# Patient Record
Sex: Female | Born: 1945 | Race: White | Hispanic: No | Marital: Married | Smoking: Former smoker
Health system: Southern US, Community
[De-identification: ages and names within clinical notes are randomized; demographics above are authoritative.]

## PROBLEM LIST (undated history)

## (undated) DIAGNOSIS — L900 Lichen sclerosus et atrophicus: Secondary | ICD-10-CM

## (undated) DIAGNOSIS — G629 Polyneuropathy, unspecified: Secondary | ICD-10-CM

## (undated) DIAGNOSIS — R519 Headache, unspecified: Secondary | ICD-10-CM

## (undated) DIAGNOSIS — Z9289 Personal history of other medical treatment: Secondary | ICD-10-CM

## (undated) DIAGNOSIS — H35039 Hypertensive retinopathy, unspecified eye: Secondary | ICD-10-CM

## (undated) DIAGNOSIS — K219 Gastro-esophageal reflux disease without esophagitis: Secondary | ICD-10-CM

## (undated) DIAGNOSIS — E785 Hyperlipidemia, unspecified: Secondary | ICD-10-CM

## (undated) DIAGNOSIS — IMO0002 Reserved for concepts with insufficient information to code with codable children: Secondary | ICD-10-CM

## (undated) DIAGNOSIS — F419 Anxiety disorder, unspecified: Secondary | ICD-10-CM

## (undated) DIAGNOSIS — H269 Unspecified cataract: Secondary | ICD-10-CM

## (undated) DIAGNOSIS — M8580 Other specified disorders of bone density and structure, unspecified site: Secondary | ICD-10-CM

## (undated) DIAGNOSIS — Z8781 Personal history of (healed) traumatic fracture: Secondary | ICD-10-CM

## (undated) DIAGNOSIS — E11319 Type 2 diabetes mellitus with unspecified diabetic retinopathy without macular edema: Secondary | ICD-10-CM

## (undated) DIAGNOSIS — I10 Essential (primary) hypertension: Secondary | ICD-10-CM

## (undated) DIAGNOSIS — M1990 Unspecified osteoarthritis, unspecified site: Secondary | ICD-10-CM

## (undated) DIAGNOSIS — G4730 Sleep apnea, unspecified: Secondary | ICD-10-CM

## (undated) DIAGNOSIS — I739 Peripheral vascular disease, unspecified: Secondary | ICD-10-CM

## (undated) DIAGNOSIS — K5792 Diverticulitis of intestine, part unspecified, without perforation or abscess without bleeding: Secondary | ICD-10-CM

## (undated) DIAGNOSIS — E039 Hypothyroidism, unspecified: Secondary | ICD-10-CM

## (undated) DIAGNOSIS — M109 Gout, unspecified: Secondary | ICD-10-CM

## (undated) DIAGNOSIS — D649 Anemia, unspecified: Secondary | ICD-10-CM

## (undated) DIAGNOSIS — N289 Disorder of kidney and ureter, unspecified: Secondary | ICD-10-CM

## (undated) DIAGNOSIS — E1165 Type 2 diabetes mellitus with hyperglycemia: Secondary | ICD-10-CM

## (undated) DIAGNOSIS — S92309A Fracture of unspecified metatarsal bone(s), unspecified foot, initial encounter for closed fracture: Secondary | ICD-10-CM

## (undated) HISTORY — PX: APPENDECTOMY: SHX54

## (undated) HISTORY — DX: Hypothyroidism, unspecified: E03.9

## (undated) HISTORY — DX: Unspecified cataract: H26.9

## (undated) HISTORY — DX: Gout, unspecified: M10.9

## (undated) HISTORY — DX: Type 2 diabetes mellitus with unspecified diabetic retinopathy without macular edema: E11.319

## (undated) HISTORY — DX: Essential (primary) hypertension: I10

## (undated) HISTORY — DX: Personal history of (healed) traumatic fracture: Z87.81

## (undated) HISTORY — PX: DIAGNOSTIC LAPAROSCOPY: SUR761

## (undated) HISTORY — DX: Unspecified osteoarthritis, unspecified site: M19.90

## (undated) HISTORY — DX: Polyneuropathy, unspecified: G62.9

## (undated) HISTORY — DX: Hypertensive retinopathy, unspecified eye: H35.039

## (undated) HISTORY — DX: Personal history of other medical treatment: Z92.89

## (undated) HISTORY — DX: Gastro-esophageal reflux disease without esophagitis: K21.9

## (undated) HISTORY — DX: Fracture of unspecified metatarsal bone(s), unspecified foot, initial encounter for closed fracture: S92.309A

## (undated) HISTORY — DX: Hyperlipidemia, unspecified: E78.5

## (undated) HISTORY — DX: Type 2 diabetes mellitus with hyperglycemia: E11.65

## (undated) HISTORY — PX: EYE SURGERY: SHX253

## (undated) HISTORY — DX: Lichen sclerosus et atrophicus: L90.0

## (undated) HISTORY — DX: Reserved for concepts with insufficient information to code with codable children: IMO0002

## (undated) HISTORY — PX: CATARACT EXTRACTION: SUR2

## (undated) HISTORY — PX: ABDOMINAL HYSTERECTOMY: SHX81

---

## 1987-01-04 HISTORY — PX: DILATION AND CURETTAGE OF UTERUS: SHX78

## 1995-01-04 HISTORY — PX: REDUCTION MAMMAPLASTY: SUR839

## 1998-01-03 HISTORY — PX: LAPAROSCOPIC HYSTERECTOMY: SHX1926

## 1999-01-04 HISTORY — PX: BREAST REDUCTION SURGERY: SHX8

## 2010-01-03 HISTORY — PX: OTHER SURGICAL HISTORY: SHX169

## 2012-08-16 ENCOUNTER — Ambulatory Visit: Payer: Self-pay | Admitting: Internal Medicine

## 2013-03-05 ENCOUNTER — Ambulatory Visit: Payer: Self-pay | Admitting: Gastroenterology

## 2013-03-05 HISTORY — PX: COLONOSCOPY: SHX174

## 2013-03-05 HISTORY — PX: ESOPHAGOGASTRODUODENOSCOPY: SHX1529

## 2013-03-09 LAB — PATHOLOGY REPORT

## 2013-09-13 ENCOUNTER — Ambulatory Visit: Payer: Self-pay | Admitting: Internal Medicine

## 2013-10-08 DIAGNOSIS — Z9289 Personal history of other medical treatment: Secondary | ICD-10-CM | POA: Insufficient documentation

## 2013-12-30 DIAGNOSIS — L900 Lichen sclerosus et atrophicus: Secondary | ICD-10-CM | POA: Insufficient documentation

## 2013-12-30 HISTORY — DX: Lichen sclerosus et atrophicus: L90.0

## 2014-05-13 DIAGNOSIS — IMO0002 Reserved for concepts with insufficient information to code with codable children: Secondary | ICD-10-CM | POA: Insufficient documentation

## 2014-05-13 DIAGNOSIS — E1165 Type 2 diabetes mellitus with hyperglycemia: Secondary | ICD-10-CM | POA: Insufficient documentation

## 2014-08-11 ENCOUNTER — Other Ambulatory Visit: Payer: Self-pay | Admitting: Internal Medicine

## 2014-08-11 DIAGNOSIS — Z1231 Encounter for screening mammogram for malignant neoplasm of breast: Secondary | ICD-10-CM

## 2014-09-15 ENCOUNTER — Ambulatory Visit
Admission: RE | Admit: 2014-09-15 | Discharge: 2014-09-15 | Disposition: A | Discharge status: Home / Self Care | Payer: Medicare Other | Source: Ambulatory Visit | Attending: Internal Medicine | Admitting: Internal Medicine

## 2014-09-15 ENCOUNTER — Other Ambulatory Visit: Payer: Self-pay | Admitting: Internal Medicine

## 2014-09-15 DIAGNOSIS — Z1231 Encounter for screening mammogram for malignant neoplasm of breast: Secondary | ICD-10-CM | POA: Insufficient documentation

## 2014-09-18 ENCOUNTER — Encounter: Payer: Self-pay | Admitting: *Deleted

## 2014-09-22 ENCOUNTER — Inpatient Hospital Stay: Payer: Medicare Other | Attending: Internal Medicine | Admitting: Internal Medicine

## 2014-09-22 ENCOUNTER — Encounter: Payer: Self-pay | Admitting: *Deleted

## 2014-09-22 ENCOUNTER — Inpatient Hospital Stay: Payer: Medicare Other

## 2014-09-22 ENCOUNTER — Telehealth: Payer: Self-pay | Admitting: *Deleted

## 2014-09-22 ENCOUNTER — Encounter (INDEPENDENT_AMBULATORY_CARE_PROVIDER_SITE_OTHER): Payer: Self-pay

## 2014-09-22 ENCOUNTER — Ambulatory Visit: Payer: Self-pay

## 2014-09-22 VITALS — BP 126/72 | HR 71 | Temp 98.0°F | Resp 18 | Ht 65.0 in | Wt 169.8 lb

## 2014-09-22 DIAGNOSIS — E039 Hypothyroidism, unspecified: Secondary | ICD-10-CM | POA: Diagnosis not present

## 2014-09-22 DIAGNOSIS — E119 Type 2 diabetes mellitus without complications: Secondary | ICD-10-CM | POA: Insufficient documentation

## 2014-09-22 DIAGNOSIS — Z87891 Personal history of nicotine dependence: Secondary | ICD-10-CM | POA: Insufficient documentation

## 2014-09-22 DIAGNOSIS — I10 Essential (primary) hypertension: Secondary | ICD-10-CM | POA: Insufficient documentation

## 2014-09-22 DIAGNOSIS — Z7982 Long term (current) use of aspirin: Secondary | ICD-10-CM | POA: Insufficient documentation

## 2014-09-22 DIAGNOSIS — D509 Iron deficiency anemia, unspecified: Secondary | ICD-10-CM

## 2014-09-22 DIAGNOSIS — Z79899 Other long term (current) drug therapy: Secondary | ICD-10-CM

## 2014-09-22 DIAGNOSIS — E785 Hyperlipidemia, unspecified: Secondary | ICD-10-CM | POA: Insufficient documentation

## 2014-09-22 DIAGNOSIS — M109 Gout, unspecified: Secondary | ICD-10-CM | POA: Insufficient documentation

## 2014-09-22 DIAGNOSIS — E079 Disorder of thyroid, unspecified: Secondary | ICD-10-CM | POA: Insufficient documentation

## 2014-09-22 LAB — RETICULOCYTES
RBC.: 4.68 MIL/uL (ref 3.80–5.20)
RETIC COUNT ABSOLUTE: 112.3 10*3/uL (ref 19.0–183.0)
Retic Ct Pct: 2.4 % (ref 0.4–3.1)

## 2014-09-22 LAB — CBC WITH DIFFERENTIAL/PLATELET
BASOS ABS: 0 10*3/uL (ref 0–0.1)
Basophils Relative: 1 %
EOS PCT: 2 %
Eosinophils Absolute: 0.2 10*3/uL (ref 0–0.7)
HCT: 38.5 % (ref 35.0–47.0)
Hemoglobin: 12.6 g/dL (ref 12.0–16.0)
LYMPHS PCT: 26 %
Lymphs Abs: 1.7 10*3/uL (ref 1.0–3.6)
MCH: 26.9 pg (ref 26.0–34.0)
MCHC: 32.7 g/dL (ref 32.0–36.0)
MCV: 82.3 fL (ref 80.0–100.0)
MONO ABS: 0.7 10*3/uL (ref 0.2–0.9)
Monocytes Relative: 11 %
Neutro Abs: 3.9 10*3/uL (ref 1.4–6.5)
Neutrophils Relative %: 60 %
PLATELETS: 350 10*3/uL (ref 150–440)
RBC: 4.68 MIL/uL (ref 3.80–5.20)
RDW: 20.3 % — AB (ref 11.5–14.5)
WBC: 6.5 10*3/uL (ref 3.6–11.0)

## 2014-09-22 LAB — COMPREHENSIVE METABOLIC PANEL
ALT: 19 U/L (ref 14–54)
ANION GAP: 9 (ref 5–15)
AST: 27 U/L (ref 15–41)
Albumin: 4.2 g/dL (ref 3.5–5.0)
Alkaline Phosphatase: 104 U/L (ref 38–126)
BILIRUBIN TOTAL: 0.8 mg/dL (ref 0.3–1.2)
BUN: 23 mg/dL — AB (ref 6–20)
CO2: 23 mmol/L (ref 22–32)
Calcium: 9.3 mg/dL (ref 8.9–10.3)
Chloride: 105 mmol/L (ref 101–111)
Creatinine, Ser: 1.68 mg/dL — ABNORMAL HIGH (ref 0.44–1.00)
GFR calc Af Amer: 35 mL/min — ABNORMAL LOW (ref >60)
GFR, EST NON AFRICAN AMERICAN: 30 mL/min — AB (ref >60)
Glucose, Bld: 131 mg/dL — ABNORMAL HIGH (ref 65–99)
POTASSIUM: 4.5 mmol/L (ref 3.5–5.1)
Sodium: 137 mmol/L (ref 135–145)
TOTAL PROTEIN: 8.5 g/dL — AB (ref 6.5–8.1)

## 2014-09-22 LAB — LACTATE DEHYDROGENASE: LDH: 140 U/L (ref 98–192)

## 2014-09-22 LAB — FERRITIN: Ferritin: 12 ng/mL (ref 11–307)

## 2014-09-22 NOTE — Progress Notes (Signed)
[facility] CONSULT NOTE  Patient Care Team: Danella Penton, MD as PCP - General (Internal Medicine)  Dr. Shelle Iron [GI]  CHIEF COMPLAINTS/PURPOSE OF CONSULTATION:  Iron deficiency anemia  HISTORY OF PRESENTING ILLNESS:   Cheryl Leonard 69 y.o. female is here because of iron deficiency anemia.  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 She also had a colonoscopy in 2015-negative for polyps. She also had EGD in spring of 2015 for reflux disease that was again negative.  March 2016-UA negative for blood.   Patient denies any blood in stools or black stools. Denies any blood in urine. Denies any nausea or vomiting denies any unusual weight loss. Patient complains of excessive fatigue especially the last 3-4 months. She is very tired after minimal exertion. Patient has been on by mouth iron in spring of 2016- without much improvement in her fatigue levels. However most recent hemoglobin/September 2016 has been around 10.5. Ferritin 7.   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 No chest pain shortness of breath. A complete 10 point review of systems the system negative except mentioned above in history of present illness.    MEDICAL HISTORY:  Past Medical History  Diagnosis Date  . Type 2 diabetes mellitus, uncontrolled   . Lichen sclerosus 12/30/2013    of vulva  . Polyneuropathy     numbness and tingling in feet and toes  . History of positive PPD     Patient always shows positive  . Hyperlipidemia   . Hypothyroidism   . History of fracture of patella     right knee  . Arthritis   . Gout   . Cataracts, both eyes   . Hypertension     SURGICAL HISTORY: Past Surgical History  Procedure Laterality Date  . Reduction mammaplasty  1997  . Appendectomy    . Cataract extraction    . Laparoscopic hysterectomy   2000    total  . Eyelid surgery  2012  . Dilation and curettage of uterus  1989  . Breast reduction surgery  2001  . Cesarean section  1976  . Colonoscopy  03/05/2013    Nml - due for repeat 03/06/2018  . Esophagogastroduodenoscopy  03/05/2013    SOCIAL HISTORY: Social History   Social History  . Marital Status: Married    Spouse Name: N/A  . Number of Children: N/A  . Years of Education: N/A   Occupational History  . Not on file.   Social History Main Topics  . Smoking status: Former Smoker -- 1.00 packs/day for 20 years    Types: Cigarettes  . Smokeless tobacco: Never Used     Comment: started smoking at age 9  . Alcohol Use: No  . Drug Use: No  . Sexual Activity:    Partners: Male    Birth Control/ Protection: Surgical   Other Topics Concern  . Not on file   Social History Narrative    FAMILY HISTORY: Family History  Problem Relation Age of Onset  . Coronary artery disease Mother   . Coronary artery disease Father   . Heart attack Mother   . Heart attack Father   . Ovarian cancer Sister 43    sister had hormonal therapy for IVF txs-which increased risk factor for ovarian cancer    ALLERGIES:  has No Known  Allergies.  MEDICATIONS:  Current Outpatient Prescriptions  Medication Sig Dispense Refill  . ALPRAZolam (XANAX) 0.25 MG tablet Take 1 tablet by mouth as needed. For sleep    . aspirin EC 81 MG tablet Take 1 tablet by mouth daily.    [name] atorvastatin (LIPITOR) 20 MG tablet Take 20 mg by mouth at bedtime.  3  . B-D UF III MINI PEN NEEDLES 31G X 5 MM MISC daily.  3  . buPROPion (WELLBUTRIN XL) 300 MG 24 hr tablet Take 1 tablet by mouth daily.  11  . BYSTOLIC 5 MG tablet Take 5 mg by mouth daily.  2  . canagliflozin (INVOKANA) 100 MG TABS tablet Take 1 tablet by mouth daily.    . CVS ALCOHOL SWABS PADS daily. as directed  3  . DULoxetine (CYMBALTA) 60 MG capsule Take 1 capsule by mouth 2 (two) times daily.    [name] gemfibrozil (LOPID) 600 MG tablet Take 600 mg  by mouth 2 (two) times daily.  3  . glimepiride (AMARYL) 4 MG tablet Take 1 tablet by mouth daily.  3  . Iron-Vitamin C (VITRON-C) 65-125 MG TABS Take 1 tablet by mouth daily.    [name] JANUMET 50-1000 MG per tablet Take 1 tablet by mouth 2 (two) times daily with a meal.  3  . levothyroxine (SYNTHROID, LEVOTHROID) 100 MCG tablet Take 100 mcg by mouth daily.  3  . lisinopril (PRINIVIL,ZESTRIL) 10 MG tablet Take 1 tablet by mouth daily.    . ranitidine (ZANTAC) 300 MG capsule Take 300 mg by mouth at bedtime.  1  . TOUJEO SOLOSTAR 300 UNIT/ML SOPN Inject 30 Units into the skin at bedtime.  7  . zolpidem (AMBIEN) 10 MG tablet Take 10 mg by mouth at bedtime.  5   No current facility-administered medications for this visit.     PHYSICAL EXAMINATION: ECOG PERFORMANCE STATUS: 1 - Symptomatic but completely ambulatory  Filed Vitals:   09/22/14 1111  BP: 126/72  Pulse: 71  Temp: 98 F (36.7 C)  Resp: 18   Filed Weights   09/22/14 1111  Weight: 169 lb 12.1 oz (77.001 kg)   Patient is alone. She is able to sit on the exam table without any difficulty. GENERAL:alert, no distress and comfortable SKIN: skin color, texture, turgor are normal, no rashes or significant lesions EYES: normal, conjunctiva are pink and non-injected, sclera clear OROPHARYNX:no exudate, no erythema and lips, buccal mucosa, and tongue normal  NECK: supple, thyroid normal size, non-tender, without nodularity LYMPH:  no palpable lymphadenopathy in the cervical, axillary or inguinal LUNGS: clear to auscultation; no wheeze or auscultation HEART: regular rate & rhythm and no murmurs and no lower extremity edema ABDOMEN:abdomen soft, non-tender and normal bowel sounds Musculoskeletal:no cyanosis of digits and no clubbing  PSYCH: alert & oriented x 3 with fluent speech NEURO: no focal motor/sensory deficits  LABORATORY DATA:  I have reviewed the data as listed    ASSESSMENT & PLAN:  #1 iron deficiency anemia-unclear  etiology question chronic GI blood loss versus malabsorption versus hemolysis. Negative EGD and colonoscopy [spring 2015] and also recent capsule study [spring 2016]. Most recent hemoglobin September 2016 is 10.5 ferritin 7.   Patient has been on by mouth iron for the last few months without any significant improvement. Even though hemoglobin is not currently very  low; patient has symptoms of extreme fatigue which could be attributed to low iron stores/ferritin levels.   I recommend checking today CBC, LDH; CMP and also reticulocyte  count. If the today's blood work does not reveal any etiology CT scans will be ordered for further evaluation of the kidneys etc. If the hemoglobin continues running low/getting worse- would recommend IV iron infusion. This was discussed with patient and she agrees.  I will recommend follow-up in approximately 3 months or earlier based upon the results of the above workup.  Thank you Dr. Shelle Iron for allowing me to participate in the care of your pleasant patient. Please do not hesitate to contact me if any questions or concerns in the interim.  All questions were answered. The patient knows to call the clinic with any problems, questions or concerns. I spent 30 minutes counseling the patient face to face. The total time spent in the appointment was 60 minutes and more than 50% was on counseling.     Earna Coder, MD 09/22/2014 11:24 AM

## 2014-09-22 NOTE — Telephone Encounter (Signed)
Per Dr. Donneta Romberg, RN notified patient patient that her creatinine is slightly up from 1.2 to currently 1.68. Explained to patient that MD has routed this information to her PCP. Regarding her hemoglobin-currently 12.5; much improved from a baseline. Patient educated to continue iron pills for now. Follow-up as planned in 3 months. Patient expressed gratitude for the call regarding her lab results.

## 2014-09-22 NOTE — Progress Notes (Signed)
The patient has been taking Vitron C since January 2016. She has been tolerating the oral iron without any complications or side effects.

## 2014-12-25 ENCOUNTER — Other Ambulatory Visit: Payer: Self-pay | Admitting: *Deleted

## 2014-12-25 ENCOUNTER — Inpatient Hospital Stay: Payer: Medicare Other | Attending: Family Medicine

## 2014-12-25 ENCOUNTER — Inpatient Hospital Stay: Payer: Medicare Other | Admitting: Family Medicine

## 2014-12-25 ENCOUNTER — Inpatient Hospital Stay (HOSPITAL_BASED_OUTPATIENT_CLINIC_OR_DEPARTMENT_OTHER): Payer: Medicare Other | Admitting: Family Medicine

## 2014-12-25 ENCOUNTER — Inpatient Hospital Stay: Payer: Medicare Other

## 2014-12-25 VITALS — BP 125/72 | HR 76 | Temp 98.8°F | Resp 18 | Ht 65.0 in | Wt 170.9 lb

## 2014-12-25 DIAGNOSIS — Z7982 Long term (current) use of aspirin: Secondary | ICD-10-CM | POA: Diagnosis not present

## 2014-12-25 DIAGNOSIS — D509 Iron deficiency anemia, unspecified: Secondary | ICD-10-CM

## 2014-12-25 DIAGNOSIS — E1165 Type 2 diabetes mellitus with hyperglycemia: Secondary | ICD-10-CM | POA: Insufficient documentation

## 2014-12-25 DIAGNOSIS — Z79899 Other long term (current) drug therapy: Secondary | ICD-10-CM | POA: Diagnosis not present

## 2014-12-25 DIAGNOSIS — K219 Gastro-esophageal reflux disease without esophagitis: Secondary | ICD-10-CM | POA: Insufficient documentation

## 2014-12-25 DIAGNOSIS — E039 Hypothyroidism, unspecified: Secondary | ICD-10-CM | POA: Diagnosis not present

## 2014-12-25 DIAGNOSIS — Z87891 Personal history of nicotine dependence: Secondary | ICD-10-CM | POA: Diagnosis not present

## 2014-12-25 DIAGNOSIS — E785 Hyperlipidemia, unspecified: Secondary | ICD-10-CM | POA: Insufficient documentation

## 2014-12-25 DIAGNOSIS — M109 Gout, unspecified: Secondary | ICD-10-CM | POA: Insufficient documentation

## 2014-12-25 DIAGNOSIS — I10 Essential (primary) hypertension: Secondary | ICD-10-CM | POA: Diagnosis not present

## 2014-12-25 LAB — IRON AND TIBC
Iron: 60 ug/dL (ref 28–170)
SATURATION RATIOS: 13 % (ref 10.4–31.8)
TIBC: 457 ug/dL — ABNORMAL HIGH (ref 250–450)
UIBC: 397 ug/dL

## 2014-12-25 LAB — CBC WITH DIFFERENTIAL/PLATELET
BASOS ABS: 0 10*3/uL (ref 0–0.1)
BASOS PCT: 1 %
EOS ABS: 0.1 10*3/uL (ref 0–0.7)
Eosinophils Relative: 2 %
HEMATOCRIT: 39.2 % (ref 35.0–47.0)
Hemoglobin: 13.3 g/dL (ref 12.0–16.0)
Lymphocytes Relative: 22 %
Lymphs Abs: 1.7 10*3/uL (ref 1.0–3.6)
MCH: 30.3 pg (ref 26.0–34.0)
MCHC: 33.8 g/dL (ref 32.0–36.0)
MCV: 89.6 fL (ref 80.0–100.0)
MONO ABS: 0.9 10*3/uL (ref 0.2–0.9)
Monocytes Relative: 12 %
NEUTROS ABS: 4.8 10*3/uL (ref 1.4–6.5)
Neutrophils Relative %: 63 %
PLATELETS: 408 10*3/uL (ref 150–440)
RBC: 4.38 MIL/uL (ref 3.80–5.20)
RDW: 15.5 % — AB (ref 11.5–14.5)
WBC: 7.6 10*3/uL (ref 3.6–11.0)

## 2014-12-25 LAB — FERRITIN: Ferritin: 15 ng/mL (ref 11–307)

## 2014-12-25 NOTE — Progress Notes (Signed)
[facility]  [phone]  [phone]     Cheryl Leonard DOB: 1945-02-09  MR#: [id number]  QIO#:[id number]  Patient Care Team: Danella Penton, MD as PCP - General (Internal Medicine) Elnita Maxwell, MD as Referring Physician (Gastroenterology) Earna Coder, MD as Consulting Physician (Hematology and Oncology)  CHIEF COMPLAINT: IDA  INTERVAL HISTORY:  Patient is here for further evaluation and treatment consideration regarding Iron deficiency Anemia. 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 She also had a colonoscopy in 2015-negative for polyps. She also had EGD in spring of 2015 for reflux disease that was again negative. March 2016-UA negative for blood. Patient denies any blood in stools or black stools. Denies any blood in urine. Denies any nausea or vomiting or any unusual weight loss. She reports improvement in fatigue over the last several months. She just returned from a 10 day cruise. She is tolerating her oral iron supplement well, only having constipation at times.   REVIEW OF SYSTEMS:   Review of Systems  Constitutional: Negative for fever, chills, weight loss, malaise/fatigue and diaphoresis.  HENT: Negative for congestion, nosebleeds and sore throat.   Eyes: Negative for blurred vision, double vision, photophobia, pain, discharge and redness.  Respiratory: Negative for cough, hemoptysis, sputum production, shortness of breath, wheezing and stridor.   Cardiovascular: Negative for chest pain, palpitations, orthopnea, claudication, leg swelling and PND.  Gastrointestinal: Negative for heartburn, nausea, vomiting, abdominal pain, diarrhea, constipation, blood in stool and melena.  Genitourinary: Negative.   Musculoskeletal: Negative.   Skin: Negative.   Neurological: Negative for dizziness, focal  weakness, seizures and weakness.       Neuropathy related to diabetes  Endo/Heme/Allergies: Does not bruise/bleed easily.  Psychiatric/Behavioral: Negative for depression. The patient is not nervous/anxious and does not have insomnia.     As per HPI. Otherwise, a complete review of systems is negatve.   PAST MEDICAL HISTORY: Past Medical History  Diagnosis Date  . Type 2 diabetes mellitus, uncontrolled   . Lichen sclerosus 12/30/2013    of vulva  . Polyneuropathy     numbness and tingling in feet and toes  . History of positive PPD     Patient always shows positive  . Hyperlipidemia   . Hypothyroidism   . History of fracture of patella     right knee  . Arthritis   . Gout   . Cataracts, both eyes   . Hypertension   . GERD (gastroesophageal reflux disease)     PAST SURGICAL HISTORY: Past Surgical History  Procedure Laterality Date  . Reduction mammaplasty  1997  . Appendectomy    . Cataract extraction    . Laparoscopic hysterectomy  2000    total  . Eyelid surgery  2012  . Dilation and curettage of uterus  1989  . Breast reduction surgery  2001  . Cesarean section  1976  . Colonoscopy  03/05/2013    Nml - due for repeat 03/06/2018  . Esophagogastroduodenoscopy  03/05/2013    FAMILY HISTORY Family History  Problem Relation Age of Onset  . Coronary artery disease Mother   . Coronary artery disease Father   . Heart attack Mother   . Heart attack Father   . Ovarian cancer Sister 59    sister had hormonal therapy for IVF txs-which increased risk factor for ovarian cancer  GYNECOLOGIC HISTORY:  No LMP recorded. Patient has had a hysterectomy.     ADVANCED DIRECTIVES:    HEALTH MAINTENANCE: Social History  Substance Use Topics  . Smoking status: Former Smoker -- 1.00 packs/day for 20 years    Types: Cigarettes  . Smokeless tobacco: Never Used     Comment: started smoking at age 69  . Alcohol Use: No     Colonoscopy:  PAP:  Bone density:  Lipid  panel:  Allergies no known allergies  Current Outpatient Prescriptions  Medication Sig Dispense Refill  . ALPRAZolam (XANAX) 0.25 MG tablet Take 1 tablet by mouth as needed. For sleep    . aspirin EC 81 MG tablet Take 1 tablet by mouth daily.    [name]. atorvastatin (LIPITOR) 20 MG tablet Take 20 mg by mouth at bedtime.  3  . B-D UF III MINI PEN NEEDLES 31G X 5 MM MISC daily.  3  . buPROPion (WELLBUTRIN XL) 300 MG 24 hr tablet Take 1 tablet by mouth daily.  11  . BYSTOLIC 5 MG tablet Take 5 mg by mouth daily.  2  . canagliflozin (INVOKANA) 100 MG TABS tablet Take 1 tablet by mouth daily.    . CVS ALCOHOL SWABS PADS daily. as directed  3  . DULoxetine (CYMBALTA) 60 MG capsule Take 1 capsule by mouth 2 (two) times daily.    [name]. gemfibrozil (LOPID) 600 MG tablet Take 600 mg by mouth 2 (two) times daily.  3  . glimepiride (AMARYL) 4 MG tablet Take 1 tablet by mouth daily.  3  . Iron-Vitamin C (VITRON-C) 65-125 MG TABS Take 1 tablet by mouth 2 (two) times daily.     [name]. JANUMET 50-1000 MG per tablet Take 1 tablet by mouth 2 (two) times daily with a meal.  3  . levothyroxine (SYNTHROID, LEVOTHROID) 100 MCG tablet Take 100 mcg by mouth daily.  3  . lisinopril (PRINIVIL,ZESTRIL) 10 MG tablet Take 1 tablet by mouth daily.    . ranitidine (ZANTAC) 300 MG capsule Take 300 mg by mouth at bedtime.  1  . TOUJEO SOLOSTAR 300 UNIT/ML SOPN Inject 30 Units into the skin at bedtime.  7  . zolpidem (AMBIEN) 10 MG tablet Take 10 mg by mouth at bedtime.  5   No current facility-administered medications for this visit.    OBJECTIVE: There were no vitals taken for this visit.   There is no weight on file to calculate BMI.    ECOG FS:0 - Asymptomatic  General: Well-developed, well-nourished, no acute distress. Eyes: Pink conjunctiva, anicteric sclera. HEENT: Normocephalic, moist mucous membranes, clear oropharnyx. Lungs: Clear to auscultation bilaterally. Heart: Regular rate and rhythm. No rubs, murmurs, or  gallops. Abdomen: Soft, nontender, nondistended. No organomegaly noted, normoactive bowel sounds. Musculoskeletal: No edema, cyanosis, or clubbing. Neuro: Alert, answering all questions appropriately. Cranial nerves grossly intact.  Skin: No rashes or petechiae noted. Psych: Normal affect.    LAB RESULTS:  Appointment on 12/25/2014  Component Date Value Ref Range Status  . WBC 12/25/2014 7.6  3.6 - 11.0 K/uL Final  . RBC 12/25/2014 4.38  3.80 - 5.20 MIL/uL Final  . Hemoglobin 12/25/2014 13.3  12.0 - 16.0 g/dL Final  . HCT 13/24/401012/22/2016 39.2  35.0 - 47.0 % Final  . MCV 12/25/2014 89.6  80.0 - 100.0 fL Final  . MCH 12/25/2014 30.3  26.0 - 34.0 pg Final  . MCHC 12/25/2014 33.8  32.0 - 36.0 g/dL Final  . RDW 27/25/366412/22/2016 15.5* 11.5 -  14.5 % Final  . Platelets 12/25/2014 408  150 - 440 K/uL Final  . Neutrophils Relative % 12/25/2014 63   Final  . Neutro Abs 12/25/2014 4.8  1.4 - 6.5 K/uL Final  . Lymphocytes Relative 12/25/2014 22   Final  . Lymphs Abs 12/25/2014 1.7  1.0 - 3.6 K/uL Final  . Monocytes Relative 12/25/2014 12   Final  . Monocytes Absolute 12/25/2014 0.9  0.2 - 0.9 K/uL Final  . Eosinophils Relative 12/25/2014 2   Final  . Eosinophils Absolute 12/25/2014 0.1  0 - 0.7 K/uL Final  . Basophils Relative 12/25/2014 1   Final  . Basophils Absolute 12/25/2014 0.0  0 - 0.1 K/uL Final    STUDIES: No results found.  ASSESSMENT:  IDA.  PLAN:   1. IDA. Etiology remains unclear, chronic gi blood loss versus malabsorption versus hemolysis. She has had a negative colonoscopy, EGD, as well as capsule study. Tolerating Iron and vitamin C tablet very well. Hgb currently 13.3.  Advised patient to continue with current regimen of oral iron. Ferritin and Iron/ TIBC are still pending. Will continue with next follow up in 3 months.   Patient expressed understanding and was in agreement with this plan. She also understands that She can call clinic at any time with any questions, concerns, or  complaints.   Dr. Doylene Canning was available for consultation and review of plan of care for this patient.   Loann Quill, NP   12/25/2014 2:34 PM

## 2015-03-26 ENCOUNTER — Inpatient Hospital Stay: Payer: Medicare Other | Attending: Internal Medicine

## 2015-03-26 ENCOUNTER — Inpatient Hospital Stay (HOSPITAL_BASED_OUTPATIENT_CLINIC_OR_DEPARTMENT_OTHER): Payer: Medicare Other | Admitting: Internal Medicine

## 2015-03-26 ENCOUNTER — Encounter: Payer: Self-pay | Admitting: Internal Medicine

## 2015-03-26 VITALS — BP 133/74 | HR 73 | Temp 97.2°F | Resp 18 | Ht 65.0 in | Wt 164.2 lb

## 2015-03-26 DIAGNOSIS — M109 Gout, unspecified: Secondary | ICD-10-CM | POA: Diagnosis not present

## 2015-03-26 DIAGNOSIS — D509 Iron deficiency anemia, unspecified: Secondary | ICD-10-CM | POA: Diagnosis not present

## 2015-03-26 DIAGNOSIS — Z90710 Acquired absence of both cervix and uterus: Secondary | ICD-10-CM | POA: Insufficient documentation

## 2015-03-26 DIAGNOSIS — Z79899 Other long term (current) drug therapy: Secondary | ICD-10-CM

## 2015-03-26 DIAGNOSIS — E119 Type 2 diabetes mellitus without complications: Secondary | ICD-10-CM | POA: Insufficient documentation

## 2015-03-26 DIAGNOSIS — K219 Gastro-esophageal reflux disease without esophagitis: Secondary | ICD-10-CM | POA: Insufficient documentation

## 2015-03-26 DIAGNOSIS — I10 Essential (primary) hypertension: Secondary | ICD-10-CM | POA: Diagnosis not present

## 2015-03-26 DIAGNOSIS — E785 Hyperlipidemia, unspecified: Secondary | ICD-10-CM | POA: Insufficient documentation

## 2015-03-26 DIAGNOSIS — E039 Hypothyroidism, unspecified: Secondary | ICD-10-CM | POA: Insufficient documentation

## 2015-03-26 DIAGNOSIS — Z87891 Personal history of nicotine dependence: Secondary | ICD-10-CM | POA: Insufficient documentation

## 2015-03-26 DIAGNOSIS — Z7982 Long term (current) use of aspirin: Secondary | ICD-10-CM | POA: Insufficient documentation

## 2015-03-26 DIAGNOSIS — M1990 Unspecified osteoarthritis, unspecified site: Secondary | ICD-10-CM | POA: Insufficient documentation

## 2015-03-26 LAB — IRON AND TIBC
IRON: 73 ug/dL (ref 28–170)
Saturation Ratios: 18 % (ref 10.4–31.8)
TIBC: 414 ug/dL (ref 250–450)
UIBC: 341 ug/dL

## 2015-03-26 LAB — CBC WITH DIFFERENTIAL/PLATELET
BASOS PCT: 1 %
Basophils Absolute: 0.1 10*3/uL (ref 0–0.1)
Eosinophils Absolute: 0.1 10*3/uL (ref 0–0.7)
Eosinophils Relative: 2 %
HEMATOCRIT: 42.7 % (ref 35.0–47.0)
HEMOGLOBIN: 14.6 g/dL (ref 12.0–16.0)
LYMPHS ABS: 1.3 10*3/uL (ref 1.0–3.6)
LYMPHS PCT: 22 %
MCH: 29.9 pg (ref 26.0–34.0)
MCHC: 34.3 g/dL (ref 32.0–36.0)
MCV: 87.2 fL (ref 80.0–100.0)
MONO ABS: 0.7 10*3/uL (ref 0.2–0.9)
MONOS PCT: 12 %
NEUTROS ABS: 3.9 10*3/uL (ref 1.4–6.5)
NEUTROS PCT: 63 %
Platelets: 344 10*3/uL (ref 150–440)
RBC: 4.9 MIL/uL (ref 3.80–5.20)
RDW: 15.1 % — AB (ref 11.5–14.5)
WBC: 6.1 10*3/uL (ref 3.6–11.0)

## 2015-03-26 LAB — FERRITIN: Ferritin: 17 ng/mL (ref 11–307)

## 2015-03-26 NOTE — Progress Notes (Signed)
Pt more fatigued because of her metarsal fracture, not moving around with boot in place on left foot. She does not see any blood in urine or stool.

## 2015-03-26 NOTE — Progress Notes (Signed)
[facility] CONSULT NOTE  Patient Care Team: Danella Penton, MD as PCP - General (Internal Medicine) Elnita Maxwell, MD as Referring Physician (Gastroenterology) Earna Coder, MD as Consulting Physician (Hematology and Oncology)  Dr. Shelle Iron [GI]  CHIEF COMPLAINTS/PURPOSE OF CONSULTATION:   # SEP 2016- Iron deficiency anemia [September 2016 is 10.5 ferritin 7; Negative EGD/ colonoscopy [spring 2015]; capsule study [spring 2016] on PO iron BID.   HISTORY OF PRESENTING ILLNESS:   Cheryl Leonard 70 y.o. female  patient with above history of iron deficiency anemia- question etiology is here for follow-up.  Patient is currently on by mouth iron twice a day. No constipation or diarrhea. No abdominal discomfort. Denies any unusual fatigue.  Patient had a recent mechanical fall for which she hurt her left foot; currently in a brace. Denies any blood in stools black red stools.  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 No chest pain shortness of breath. A complete 10 point review of systems the system negative except mentioned above in history of present illness.    MEDICAL HISTORY:  Past Medical History  Diagnosis Date  . Type 2 diabetes mellitus, uncontrolled (HCC)   . Lichen sclerosus 12/30/2013    of vulva  . Polyneuropathy (HCC)     numbness and tingling in feet and toes  . History of positive PPD     Patient always shows positive  . Hyperlipidemia   . Hypothyroidism   . History of fracture of patella     right knee  . Arthritis   . Gout   . Cataracts, both eyes   . Hypertension   . GERD (gastroesophageal reflux disease)   . Metatarsal fracture     SURGICAL HISTORY: Past Surgical History  Procedure Laterality Date  . Reduction mammaplasty  1997  . Appendectomy    . Cataract extraction    . Laparoscopic hysterectomy  2000    total  . Eyelid surgery  2012  . Dilation and curettage of uterus  1989  . Breast  reduction surgery  2001  . Cesarean section  1976  . Colonoscopy  03/05/2013    Nml - due for repeat 03/06/2018  . Esophagogastroduodenoscopy  03/05/2013    SOCIAL HISTORY: Social History   Social History  . Marital Status: Married    Spouse Name: N/A  . Number of Children: N/A  . Years of Education: N/A   Occupational History  . Not on file.   Social History Main Topics  . Smoking status: Former Smoker -- 1.00 packs/day for 20 years    Types: Cigarettes  . Smokeless tobacco: Never Used     Comment: started smoking at age 60  . Alcohol Use: No  . Drug Use: No  . Sexual Activity:    Partners: Male    Birth Control/ Protection: Surgical   Other Topics Concern  . Not on file   Social History Narrative    FAMILY HISTORY: Family History  Problem Relation Age of Onset  . Coronary artery disease Mother   . Coronary artery disease Father   . Heart attack Mother   . Heart attack Father   . Ovarian cancer Sister 60    sister had hormonal therapy for IVF txs-which increased risk factor for ovarian cancer    ALLERGIES:  has No Known Allergies.  MEDICATIONS:  Current Outpatient Prescriptions  Medication Sig Dispense Refill  . ALPRAZolam (XANAX) 0.25 MG tablet Take 1 tablet by  mouth as needed. For sleep    . aspirin EC 81 MG tablet Take 1 tablet by mouth daily.    [name]. atorvastatin (LIPITOR) 20 MG tablet Take 20 mg by mouth at bedtime.  3  . B-D UF III MINI PEN NEEDLES 31G X 5 MM MISC daily.  3  . buPROPion (WELLBUTRIN XL) 300 MG 24 hr tablet Take 1 tablet by mouth daily.  11  . BYSTOLIC 5 MG tablet Take 5 mg by mouth daily.  2  . CVS ALCOHOL SWABS PADS daily. as directed  3  . DULoxetine (CYMBALTA) 60 MG capsule Take 1 capsule by mouth 2 (two) times daily.    [name]. gemfibrozil (LOPID) 600 MG tablet Take 600 mg by mouth 2 (two) times daily.  3  . glimepiride (AMARYL) 4 MG tablet Take 1 tablet by mouth daily.  3  . Iron-Vitamin C (VITRON-C) 65-125 MG TABS Take 1 tablet by mouth 2  (two) times daily.     [name]. JANUMET 50-1000 MG per tablet Take 1 tablet by mouth 2 (two) times daily with a meal.  3  . levothyroxine (SYNTHROID, LEVOTHROID) 100 MCG tablet Take 100 mcg by mouth daily.  3  . lisinopril (PRINIVIL,ZESTRIL) 10 MG tablet Take 1 tablet by mouth daily.    . pantoprazole (PROTONIX) 40 MG tablet Take 40 mg by mouth 2 (two) times daily.    . ranitidine (ZANTAC) 300 MG capsule Take 300 mg by mouth at bedtime.  1  . TOUJEO SOLOSTAR 300 UNIT/ML SOPN Inject 30 Units into the skin at bedtime.  7  . zolpidem (AMBIEN) 10 MG tablet Take 10 mg by mouth at bedtime.  5   No current facility-administered medications for this visit.     PHYSICAL EXAMINATION: ECOG PERFORMANCE STATUS: 1 - Symptomatic but completely ambulatory  Filed Vitals:   03/26/15 1055  BP: 133/74  Pulse: 73  Temp: 97.2 F (36.2 C)  Resp: 18   Filed Weights   03/26/15 1055  Weight: 164 lb 3.9 oz (74.5 kg)   Patient is alone. GENERAL:alert, no distress and comfortable SKIN: skin color, texture, turgor are normal, no rashes or significant lesions EYES: normal, conjunctiva are pink and non-injected, sclera clear OROPHARYNX:no exudate, no erythema and lips, buccal mucosa, and tongue normal  NECK: supple, thyroid normal size, non-tender, without nodularity LYMPH:  no palpable lymphadenopathy in the cervical, axillary or inguinal LUNGS: clear to auscultation; no wheeze or auscultation HEART: regular rate & rhythm and no murmurs and no lower extremity edema; LEFT foot in brace.  ABDOMEN:abdomen soft, non-tender and normal bowel sounds Musculoskeletal:no cyanosis of digits and no clubbing  PSYCH: alert & oriented x 3 with fluent speech NEURO: no focal motor/sensory deficits  ASSESSMENT & PLAN:  #1 iron deficiency anemia-unclear etiology. GI workup negative. On by mouth iron twice a day.  Today hemoglobin is 14; ferritin 17. Saturation 18%. Continue by mouth iron at this time. She does not need any IV  iron at this time.   # Patient follow-up with me in 6 months with CBC BMP iron studies ferritin; possible Feraheme if needed [unlikely to need IV iron].      Earna CoderGovinda R Brahmanday, MD 03/26/2015 11:04 AM

## 2015-04-16 DIAGNOSIS — S32000A Wedge compression fracture of unspecified lumbar vertebra, initial encounter for closed fracture: Secondary | ICD-10-CM | POA: Insufficient documentation

## 2015-04-16 DIAGNOSIS — I10 Essential (primary) hypertension: Secondary | ICD-10-CM | POA: Insufficient documentation

## 2015-04-16 DIAGNOSIS — E782 Mixed hyperlipidemia: Secondary | ICD-10-CM | POA: Insufficient documentation

## 2015-09-17 ENCOUNTER — Other Ambulatory Visit: Payer: Self-pay | Admitting: Internal Medicine

## 2015-09-17 DIAGNOSIS — Z1231 Encounter for screening mammogram for malignant neoplasm of breast: Secondary | ICD-10-CM

## 2015-09-22 ENCOUNTER — Other Ambulatory Visit: Payer: Self-pay

## 2015-09-22 ENCOUNTER — Inpatient Hospital Stay: Payer: Medicare Other | Attending: Internal Medicine

## 2015-09-22 DIAGNOSIS — M109 Gout, unspecified: Secondary | ICD-10-CM | POA: Insufficient documentation

## 2015-09-22 DIAGNOSIS — M1990 Unspecified osteoarthritis, unspecified site: Secondary | ICD-10-CM | POA: Insufficient documentation

## 2015-09-22 DIAGNOSIS — D509 Iron deficiency anemia, unspecified: Secondary | ICD-10-CM

## 2015-09-22 DIAGNOSIS — Z79899 Other long term (current) drug therapy: Secondary | ICD-10-CM | POA: Insufficient documentation

## 2015-09-22 DIAGNOSIS — Z87891 Personal history of nicotine dependence: Secondary | ICD-10-CM | POA: Diagnosis not present

## 2015-09-22 DIAGNOSIS — E785 Hyperlipidemia, unspecified: Secondary | ICD-10-CM | POA: Insufficient documentation

## 2015-09-22 DIAGNOSIS — Z7982 Long term (current) use of aspirin: Secondary | ICD-10-CM | POA: Diagnosis not present

## 2015-09-22 DIAGNOSIS — K219 Gastro-esophageal reflux disease without esophagitis: Secondary | ICD-10-CM | POA: Diagnosis not present

## 2015-09-22 DIAGNOSIS — E119 Type 2 diabetes mellitus without complications: Secondary | ICD-10-CM | POA: Diagnosis not present

## 2015-09-22 DIAGNOSIS — I10 Essential (primary) hypertension: Secondary | ICD-10-CM | POA: Diagnosis not present

## 2015-09-22 DIAGNOSIS — E039 Hypothyroidism, unspecified: Secondary | ICD-10-CM | POA: Insufficient documentation

## 2015-09-22 DIAGNOSIS — G629 Polyneuropathy, unspecified: Secondary | ICD-10-CM | POA: Diagnosis not present

## 2015-09-22 LAB — CBC WITH DIFFERENTIAL/PLATELET
BASOS ABS: 0 10*3/uL (ref 0–0.1)
BASOS PCT: 1 %
Eosinophils Absolute: 0.2 10*3/uL (ref 0–0.7)
Eosinophils Relative: 2 %
HEMATOCRIT: 35.4 % (ref 35.0–47.0)
HEMOGLOBIN: 12.5 g/dL (ref 12.0–16.0)
Lymphocytes Relative: 24 %
Lymphs Abs: 1.7 10*3/uL (ref 1.0–3.6)
MCH: 31.3 pg (ref 26.0–34.0)
MCHC: 35.4 g/dL (ref 32.0–36.0)
MCV: 88.4 fL (ref 80.0–100.0)
MONO ABS: 0.7 10*3/uL (ref 0.2–0.9)
Monocytes Relative: 10 %
NEUTROS ABS: 4.3 10*3/uL (ref 1.4–6.5)
NEUTROS PCT: 63 %
Platelets: 397 10*3/uL (ref 150–440)
RBC: 4 MIL/uL (ref 3.80–5.20)
RDW: 13.4 % (ref 11.5–14.5)
WBC: 7 10*3/uL (ref 3.6–11.0)

## 2015-09-22 LAB — IRON AND TIBC
Iron: 72 ug/dL (ref 28–170)
SATURATION RATIOS: 19 % (ref 10.4–31.8)
TIBC: 388 ug/dL (ref 250–450)
UIBC: 316 ug/dL

## 2015-09-22 LAB — COMPREHENSIVE METABOLIC PANEL
ALBUMIN: 4.3 g/dL (ref 3.5–5.0)
ALT: 34 U/L (ref 14–54)
AST: 40 U/L (ref 15–41)
Alkaline Phosphatase: 80 U/L (ref 38–126)
Anion gap: 9 (ref 5–15)
BILIRUBIN TOTAL: 0.9 mg/dL (ref 0.3–1.2)
BUN: 24 mg/dL — AB (ref 6–20)
CO2: 24 mmol/L (ref 22–32)
Calcium: 9.6 mg/dL (ref 8.9–10.3)
Chloride: 103 mmol/L (ref 101–111)
Creatinine, Ser: 1.29 mg/dL — ABNORMAL HIGH (ref 0.44–1.00)
GFR calc Af Amer: 47 mL/min — ABNORMAL LOW (ref >60)
GFR calc non Af Amer: 41 mL/min — ABNORMAL LOW (ref >60)
GLUCOSE: 144 mg/dL — AB (ref 65–99)
POTASSIUM: 5 mmol/L (ref 3.5–5.1)
Sodium: 136 mmol/L (ref 135–145)
TOTAL PROTEIN: 7.9 g/dL (ref 6.5–8.1)

## 2015-09-22 LAB — FERRITIN: Ferritin: 35 ng/mL (ref 11–307)

## 2015-09-24 ENCOUNTER — Inpatient Hospital Stay: Payer: Medicare Other

## 2015-09-24 ENCOUNTER — Inpatient Hospital Stay (HOSPITAL_BASED_OUTPATIENT_CLINIC_OR_DEPARTMENT_OTHER): Payer: Medicare Other | Admitting: Internal Medicine

## 2015-09-24 ENCOUNTER — Encounter: Payer: Self-pay | Admitting: Internal Medicine

## 2015-09-24 VITALS — BP 99/66 | HR 78 | Temp 95.4°F | Resp 17 | Ht 65.0 in | Wt 165.4 lb

## 2015-09-24 DIAGNOSIS — Z79899 Other long term (current) drug therapy: Secondary | ICD-10-CM | POA: Diagnosis not present

## 2015-09-24 DIAGNOSIS — D509 Iron deficiency anemia, unspecified: Secondary | ICD-10-CM | POA: Diagnosis not present

## 2015-09-24 NOTE — Progress Notes (Signed)
[facility] CONSULT NOTE  Patient Care Team: Danella Penton, MD as PCP - General (Internal Medicine) Elnita Maxwell, MD as Referring Physician (Gastroenterology) Earna Coder, MD as Consulting Physician (Hematology and Oncology)  Dr. Shelle Iron [GI]  CHIEF COMPLAINTS/PURPOSE OF CONSULTATION:   # SEP 2016- Iron deficiency anemia [September 2016 is 10.5 ferritin 7; Negative EGD/ colonoscopy [spring 2015]; capsule study [spring 2016] on PO iron BID.   # CKD [creat 1.3-1.6]  HISTORY OF PRESENTING ILLNESS:   Cheryl Leonard 70 y.o. female  patient with above history of iron deficiency anemia- question etiology is here for follow-up.  Patient continues to be on oral iron.  No constipation or diarrhea. No abdominal discomfort. Denies any unusual fatigue.  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 No chest pain shortness of breath. A complete 10 point review of systems the system negative except mentioned above in history of present illness.    MEDICAL HISTORY:  Past Medical History:  Diagnosis Date  . Arthritis   . Cataracts, both eyes   . GERD (gastroesophageal reflux disease)   . Gout   . History of fracture of patella    right knee  . History of positive PPD    Patient always shows positive  . Hyperlipidemia   . Hypertension   . Hypothyroidism   . Lichen sclerosus 12/30/2013   of vulva  . Metatarsal fracture   . Polyneuropathy (HCC)    numbness and tingling in feet and toes  . Type 2 diabetes mellitus, uncontrolled (HCC)     SURGICAL HISTORY: Past Surgical History:  Procedure Laterality Date  . APPENDECTOMY    . BREAST REDUCTION SURGERY  2001  . CATARACT EXTRACTION    . CESAREAN SECTION  1976  . COLONOSCOPY  03/05/2013   Nml - due for repeat 03/06/2018  . DILATION AND CURETTAGE OF UTERUS  1989  . ESOPHAGOGASTRODUODENOSCOPY  03/05/2013  . Eyelid Surgery  2012  . LAPAROSCOPIC HYSTERECTOMY  2000   total  . REDUCTION  MAMMAPLASTY  1997    SOCIAL HISTORY: Social History   Social History  . Marital status: Married    Spouse name: N/A  . Number of children: N/A  . Years of education: N/A   Occupational History  . Not on file.   Social History Main Topics  . Smoking status: Former Smoker    Packs/day: 1.00    Years: 20.00    Types: Cigarettes  . Smokeless tobacco: Never Used     Comment: started smoking at age 57  . Alcohol use No  . Drug use: No  . Sexual activity: Yes    Partners: Male    Birth control/ protection: Surgical   Other Topics Concern  . Not on file   Social History Narrative  . No narrative on file    FAMILY HISTORY: Family History  Problem Relation Age of Onset  . Coronary artery disease Father   . Heart attack Father   . Coronary artery disease Mother   . Heart attack Mother   . Ovarian cancer Sister 43    sister had hormonal therapy for IVF txs-which increased risk factor for ovarian cancer    ALLERGIES:  has No Known Allergies.  MEDICATIONS:  Current Outpatient Prescriptions  Medication Sig Dispense Refill  . ALPRAZolam (XANAX) 0.25 MG tablet Take 1 tablet by mouth as needed. For sleep    . aspirin EC 81 MG tablet Take 1 tablet by  mouth daily.    [name]. atorvastatin (LIPITOR) 20 MG tablet Take 20 mg by mouth at bedtime.  3  . B-D UF III MINI PEN NEEDLES 31G X 5 MM MISC daily.  3  . buPROPion (WELLBUTRIN XL) 300 MG 24 hr tablet Take 1 tablet by mouth daily.  11  . BYSTOLIC 5 MG tablet Take 5 mg by mouth daily.  2  . [employer] ALCOHOL SWABS PADS daily. as directed  3  . DULoxetine (CYMBALTA) 60 MG capsule Take 1 capsule by mouth 2 (two) times daily.    [name]. gemfibrozil (LOPID) 600 MG tablet Take 600 mg by mouth 2 (two) times daily.  3  . Iron-Vitamin C (VITRON-C) 65-125 MG TABS Take 1 tablet by mouth 2 (two) times daily.     [name]. JANUMET 50-1000 MG per tablet Take 1 tablet by mouth 2 (two) times daily with a meal.  3  . levothyroxine (SYNTHROID, LEVOTHROID) 100 MCG tablet  Take 100 mcg by mouth daily.  3  . lisinopril (PRINIVIL,ZESTRIL) 10 MG tablet Take 1 tablet by mouth daily.    . pantoprazole (PROTONIX) 40 MG tablet Take 40 mg by mouth 2 (two) times daily.    . ranitidine (ZANTAC) 300 MG capsule Take 300 mg by mouth at bedtime.  1  . TOUJEO SOLOSTAR 300 UNIT/ML SOPN Inject 30 Units into the skin at bedtime.  7  . zolpidem (AMBIEN) 10 MG tablet Take 10 mg by mouth at bedtime.  5   No current facility-administered medications for this visit.      PHYSICAL EXAMINATION: ECOG PERFORMANCE STATUS: 1 - Symptomatic but completely ambulatory  Vitals:   09/24/15 1335  BP: 99/66  Pulse: 78  Resp: 17  Temp: (!) 95.4 F (35.2 C)   Filed Weights   09/24/15 1335  Weight: 165 lb 6.4 oz (75 kg)   Patient is alone. GENERAL:alert, no distress and comfortable SKIN: skin color, texture, turgor are normal, no rashes or significant lesions EYES: normal, conjunctiva are pink and non-injected, sclera clear OROPHARYNX:no exudate, no erythema and lips, buccal mucosa, and tongue normal  NECK: supple, thyroid normal size, non-tender, without nodularity LYMPH:  no palpable lymphadenopathy in the cervical, axillary or inguinal LUNGS: clear to auscultation; no wheeze or auscultation HEART: regular rate & rhythm and no murmurs and no lower extremity edema ABDOMEN:abdomen soft, non-tender and normal bowel sounds Musculoskeletal:no cyanosis of digits and no clubbing  PSYCH: alert & oriented x 3 with fluent speech NEURO: no focal motor/sensory deficits  ASSESSMENT & PLAN:  Iron deficiency anemia #1 iron deficiency anemia-unclear etiology. GI workup negative. On by mouth iron twice a day.  Today hemoglobin is 12.5; Iron studies- not deficient.   # Patient follow-up with me in 6 months with CBC BMP iron studies ferritin- 1 week prior; if steady then discharge pt from clinic at that time. Pt agrees.        Earna CoderGovinda R Aleeza [place], MD 09/25/15 6:17 PM

## 2015-09-24 NOTE — Assessment & Plan Note (Signed)
#  1 iron deficiency anemia-unclear etiology. GI workup negative. On by mouth iron twice a day.  Today hemoglobin is 12.5; Iron studies- not deficient.   # Patient follow-up with me in 6 months with CBC BMP iron studies ferritin- 1 week prior; if steady then discharge pt from clinic at that time. Pt agrees.

## 2015-10-08 ENCOUNTER — Ambulatory Visit
Admission: RE | Admit: 2015-10-08 | Discharge: 2015-10-08 | Disposition: A | Discharge status: Home / Self Care | Payer: Medicare Other | Source: Ambulatory Visit | Attending: Internal Medicine | Admitting: Internal Medicine

## 2015-10-08 DIAGNOSIS — Z1231 Encounter for screening mammogram for malignant neoplasm of breast: Secondary | ICD-10-CM | POA: Insufficient documentation

## 2015-10-16 DIAGNOSIS — E039 Hypothyroidism, unspecified: Secondary | ICD-10-CM | POA: Insufficient documentation

## 2015-10-16 DIAGNOSIS — N1832 Chronic kidney disease, stage 3b: Secondary | ICD-10-CM | POA: Insufficient documentation

## 2015-10-29 ENCOUNTER — Observation Stay: Payer: Medicare Other

## 2015-10-29 ENCOUNTER — Emergency Department: Payer: Medicare Other

## 2015-10-29 ENCOUNTER — Encounter: Payer: Self-pay | Admitting: Emergency Medicine

## 2015-10-29 ENCOUNTER — Inpatient Hospital Stay
Admission: EM | Admit: 2015-10-29 | Discharge: 2015-11-01 | DRG: 480 | Disposition: A | Discharge status: Skilled Nursing Facility | Payer: Medicare Other | Attending: Internal Medicine | Admitting: Internal Medicine

## 2015-10-29 DIAGNOSIS — Z419 Encounter for procedure for purposes other than remedying health state, unspecified: Secondary | ICD-10-CM

## 2015-10-29 DIAGNOSIS — S72113A Displaced fracture of greater trochanter of unspecified femur, initial encounter for closed fracture: Secondary | ICD-10-CM | POA: Diagnosis present

## 2015-10-29 DIAGNOSIS — E039 Hypothyroidism, unspecified: Secondary | ICD-10-CM | POA: Diagnosis present

## 2015-10-29 DIAGNOSIS — Z794 Long term (current) use of insulin: Secondary | ICD-10-CM

## 2015-10-29 DIAGNOSIS — W19XXXA Unspecified fall, initial encounter: Secondary | ICD-10-CM | POA: Diagnosis present

## 2015-10-29 DIAGNOSIS — E119 Type 2 diabetes mellitus without complications: Secondary | ICD-10-CM | POA: Diagnosis present

## 2015-10-29 DIAGNOSIS — Z87891 Personal history of nicotine dependence: Secondary | ICD-10-CM

## 2015-10-29 DIAGNOSIS — K219 Gastro-esophageal reflux disease without esophagitis: Secondary | ICD-10-CM | POA: Diagnosis present

## 2015-10-29 DIAGNOSIS — Y92010 Kitchen of single-family (private) house as the place of occurrence of the external cause: Secondary | ICD-10-CM

## 2015-10-29 DIAGNOSIS — E785 Hyperlipidemia, unspecified: Secondary | ICD-10-CM | POA: Diagnosis present

## 2015-10-29 DIAGNOSIS — Z7982 Long term (current) use of aspirin: Secondary | ICD-10-CM

## 2015-10-29 DIAGNOSIS — S72002A Fracture of unspecified part of neck of left femur, initial encounter for closed fracture: Secondary | ICD-10-CM

## 2015-10-29 DIAGNOSIS — Z8041 Family history of malignant neoplasm of ovary: Secondary | ICD-10-CM

## 2015-10-29 DIAGNOSIS — Z79899 Other long term (current) drug therapy: Secondary | ICD-10-CM

## 2015-10-29 DIAGNOSIS — J9811 Atelectasis: Secondary | ICD-10-CM | POA: Diagnosis not present

## 2015-10-29 DIAGNOSIS — M25552 Pain in left hip: Secondary | ICD-10-CM | POA: Diagnosis not present

## 2015-10-29 DIAGNOSIS — W1830XA Fall on same level, unspecified, initial encounter: Secondary | ICD-10-CM | POA: Diagnosis present

## 2015-10-29 DIAGNOSIS — S72115A Nondisplaced fracture of greater trochanter of left femur, initial encounter for closed fracture: Secondary | ICD-10-CM | POA: Diagnosis not present

## 2015-10-29 DIAGNOSIS — Z8249 Family history of ischemic heart disease and other diseases of the circulatory system: Secondary | ICD-10-CM

## 2015-10-29 DIAGNOSIS — S72009A Fracture of unspecified part of neck of unspecified femur, initial encounter for closed fracture: Secondary | ICD-10-CM | POA: Diagnosis present

## 2015-10-29 DIAGNOSIS — R509 Fever, unspecified: Secondary | ICD-10-CM

## 2015-10-29 DIAGNOSIS — J189 Pneumonia, unspecified organism: Secondary | ICD-10-CM | POA: Diagnosis not present

## 2015-10-29 DIAGNOSIS — I10 Essential (primary) hypertension: Secondary | ICD-10-CM | POA: Diagnosis present

## 2015-10-29 LAB — BASIC METABOLIC PANEL
Anion gap: 9 (ref 5–15)
BUN: 25 mg/dL — AB (ref 6–20)
CHLORIDE: 104 mmol/L (ref 101–111)
CO2: 24 mmol/L (ref 22–32)
Calcium: 9.7 mg/dL (ref 8.9–10.3)
Creatinine, Ser: 1.29 mg/dL — ABNORMAL HIGH (ref 0.44–1.00)
GFR calc Af Amer: 47 mL/min — ABNORMAL LOW (ref >60)
GFR calc non Af Amer: 41 mL/min — ABNORMAL LOW (ref >60)
GLUCOSE: 122 mg/dL — AB (ref 65–99)
POTASSIUM: 4.6 mmol/L (ref 3.5–5.1)
Sodium: 137 mmol/L (ref 135–145)

## 2015-10-29 LAB — PROTIME-INR
INR: 0.98
PROTHROMBIN TIME: 13 s (ref 11.4–15.2)

## 2015-10-29 LAB — CBC WITH DIFFERENTIAL/PLATELET
Basophils Absolute: 0 10*3/uL (ref 0–0.1)
Basophils Relative: 0 %
EOS PCT: 1 %
Eosinophils Absolute: 0.2 10*3/uL (ref 0–0.7)
HCT: 35 % (ref 35.0–47.0)
HEMOGLOBIN: 12 g/dL (ref 12.0–16.0)
LYMPHS ABS: 1.4 10*3/uL (ref 1.0–3.6)
LYMPHS PCT: 11 %
MCH: 31.1 pg (ref 26.0–34.0)
MCHC: 34.3 g/dL (ref 32.0–36.0)
MCV: 90.8 fL (ref 80.0–100.0)
Monocytes Absolute: 0.8 10*3/uL (ref 0.2–0.9)
Monocytes Relative: 6 %
Neutro Abs: 10.4 10*3/uL — ABNORMAL HIGH (ref 1.4–6.5)
Neutrophils Relative %: 82 %
PLATELETS: 338 10*3/uL (ref 150–440)
RBC: 3.86 MIL/uL (ref 3.80–5.20)
RDW: 13.7 % (ref 11.5–14.5)
WBC: 12.9 10*3/uL — AB (ref 3.6–11.0)

## 2015-10-29 LAB — TYPE AND SCREEN
ABO/RH(D): B NEG
Antibody Screen: NEGATIVE

## 2015-10-29 LAB — GLUCOSE, CAPILLARY: Glucose-Capillary: 144 mg/dL — ABNORMAL HIGH (ref 65–99)

## 2015-10-29 LAB — APTT: APTT: 28 s (ref 24–36)

## 2015-10-29 IMAGING — CR DG HIP (WITH OR WITHOUT PELVIS) 2-3V*L*
3 series · 3 of 3 positions shown · non-contrast
Comparison: None.

CLINICAL DATA: Fall.  Left hip pain.  Initial encounter.

EXAM:
DG HIP (WITH OR WITHOUT PELVIS) 2-3V LEFT

[pelvis ap]
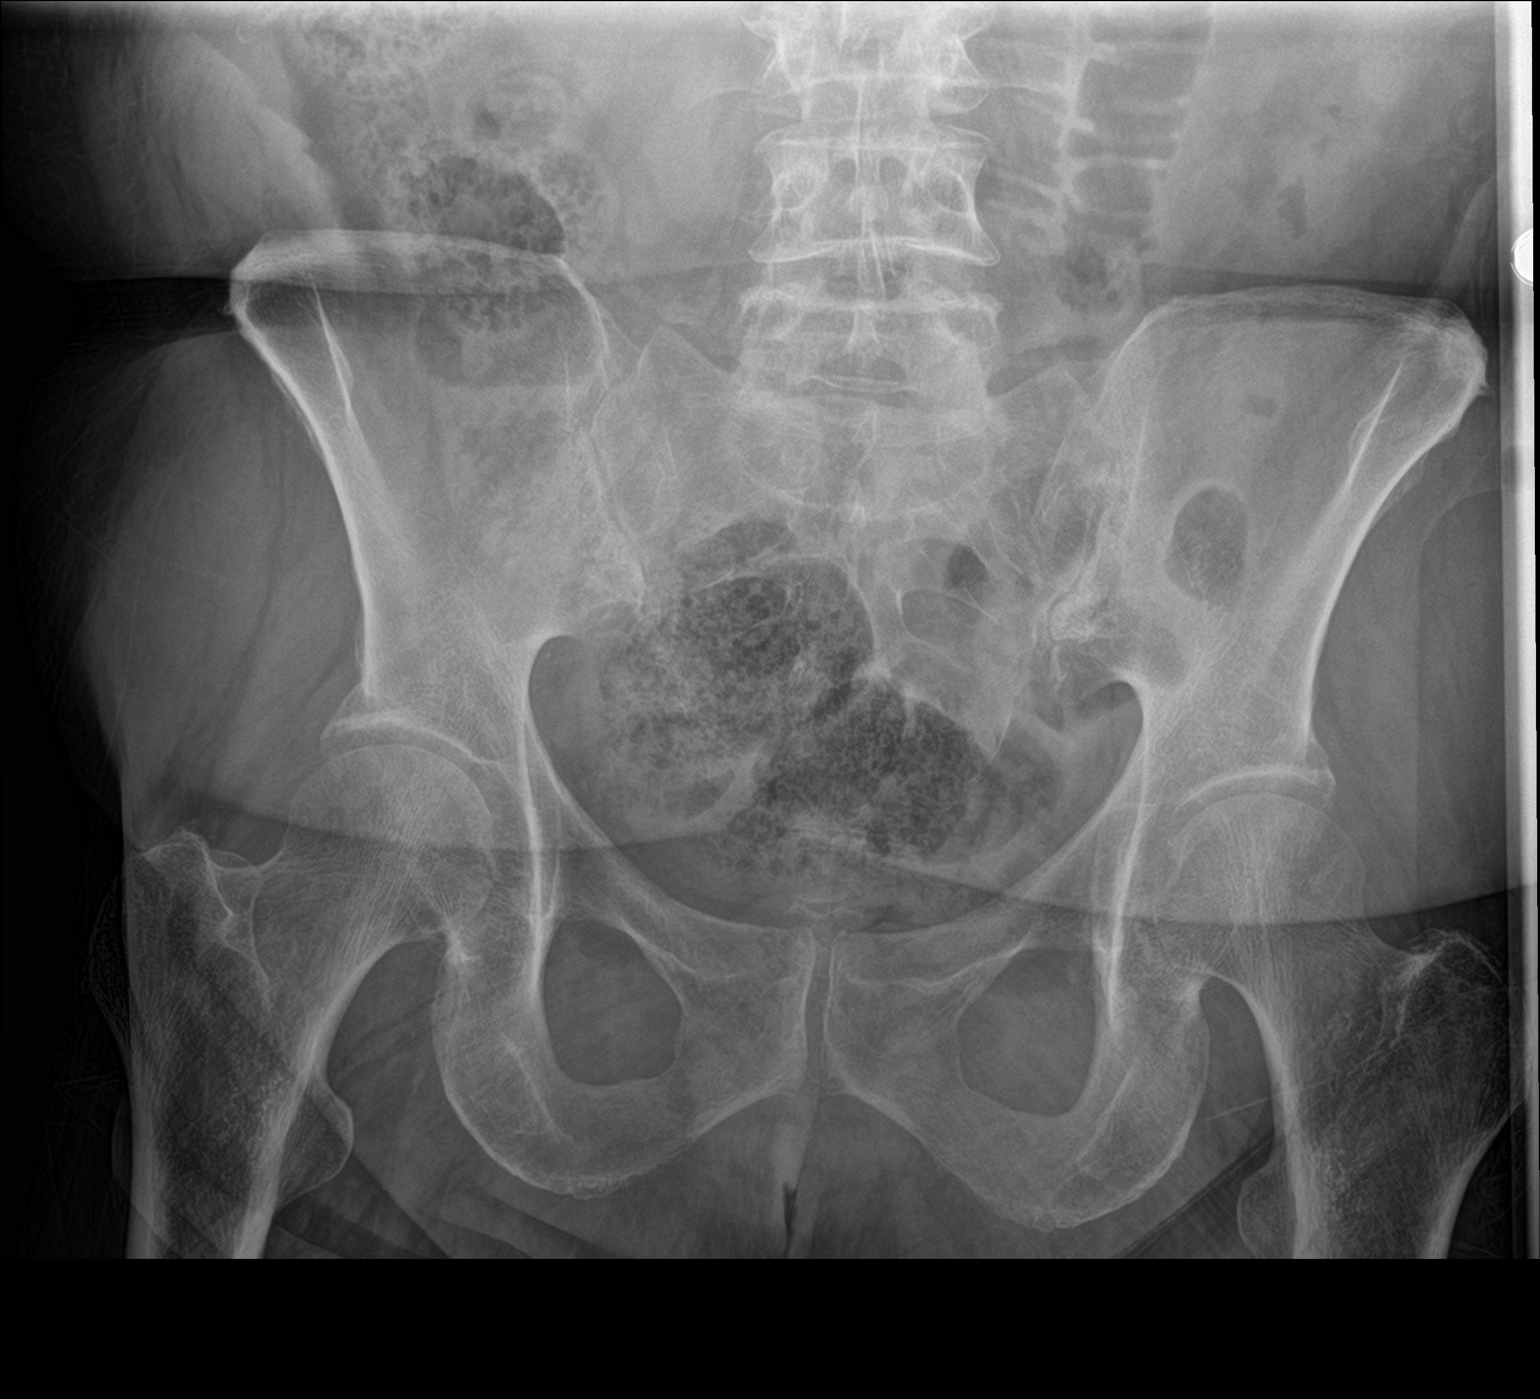

[hip ap]
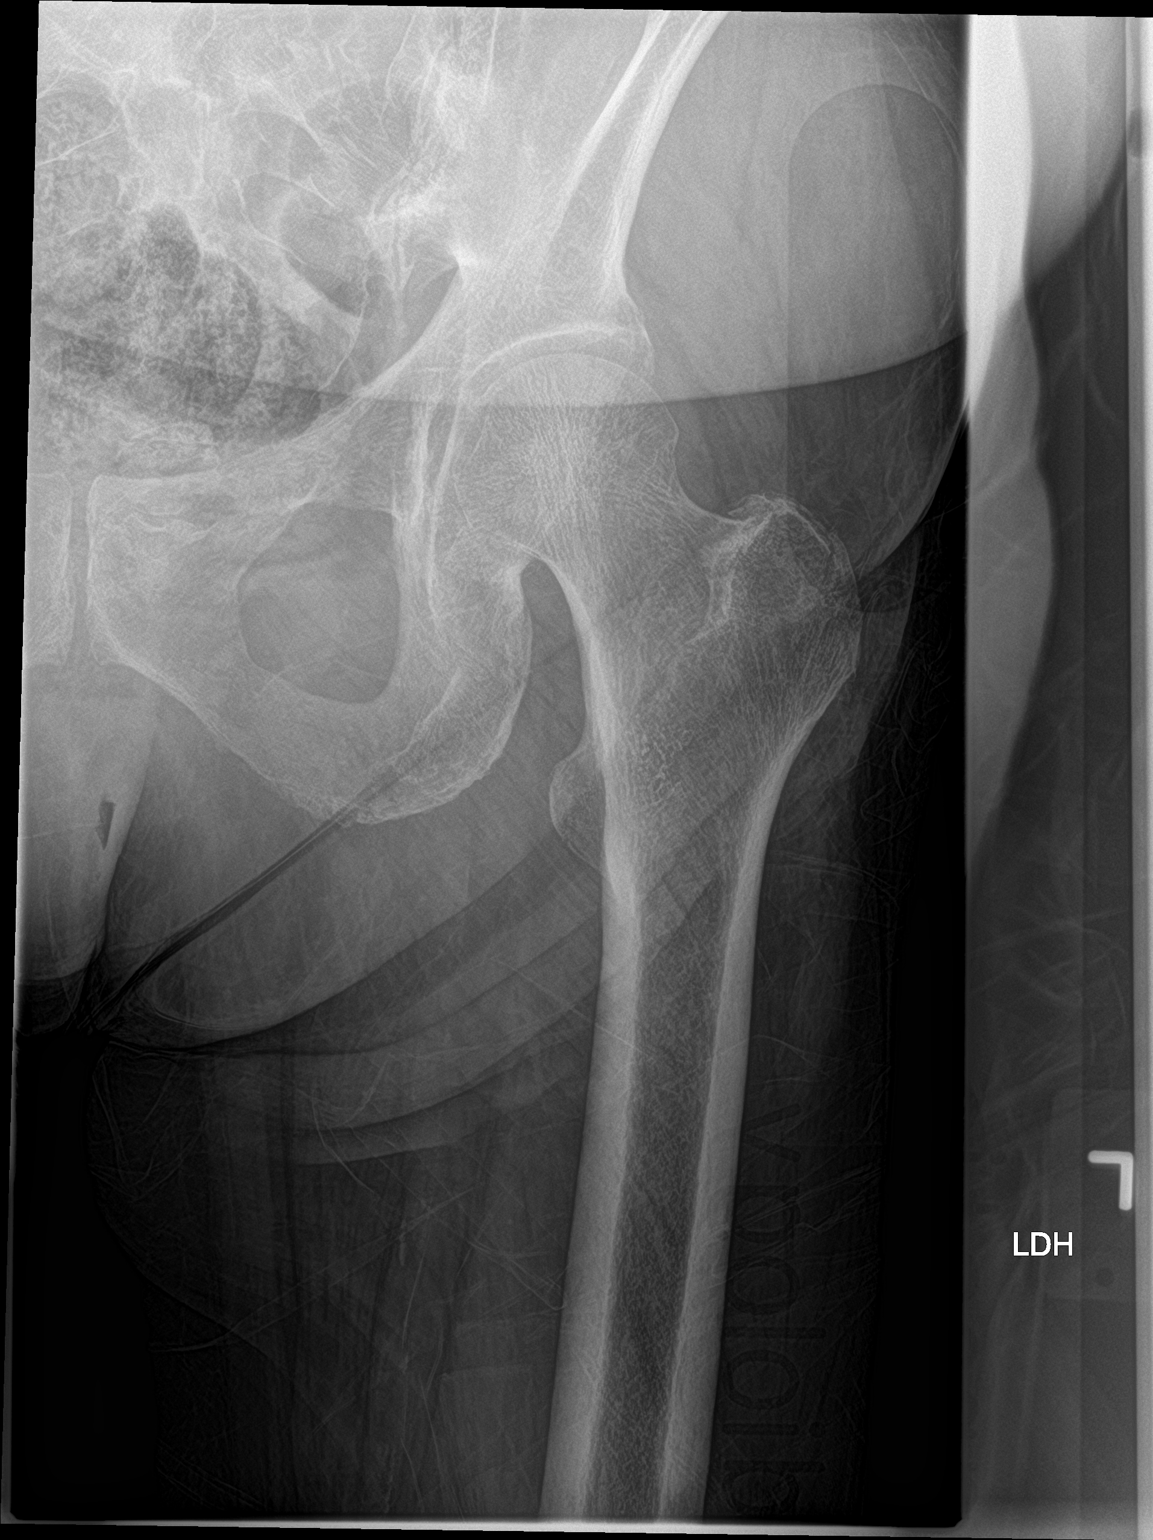

[hip lat]
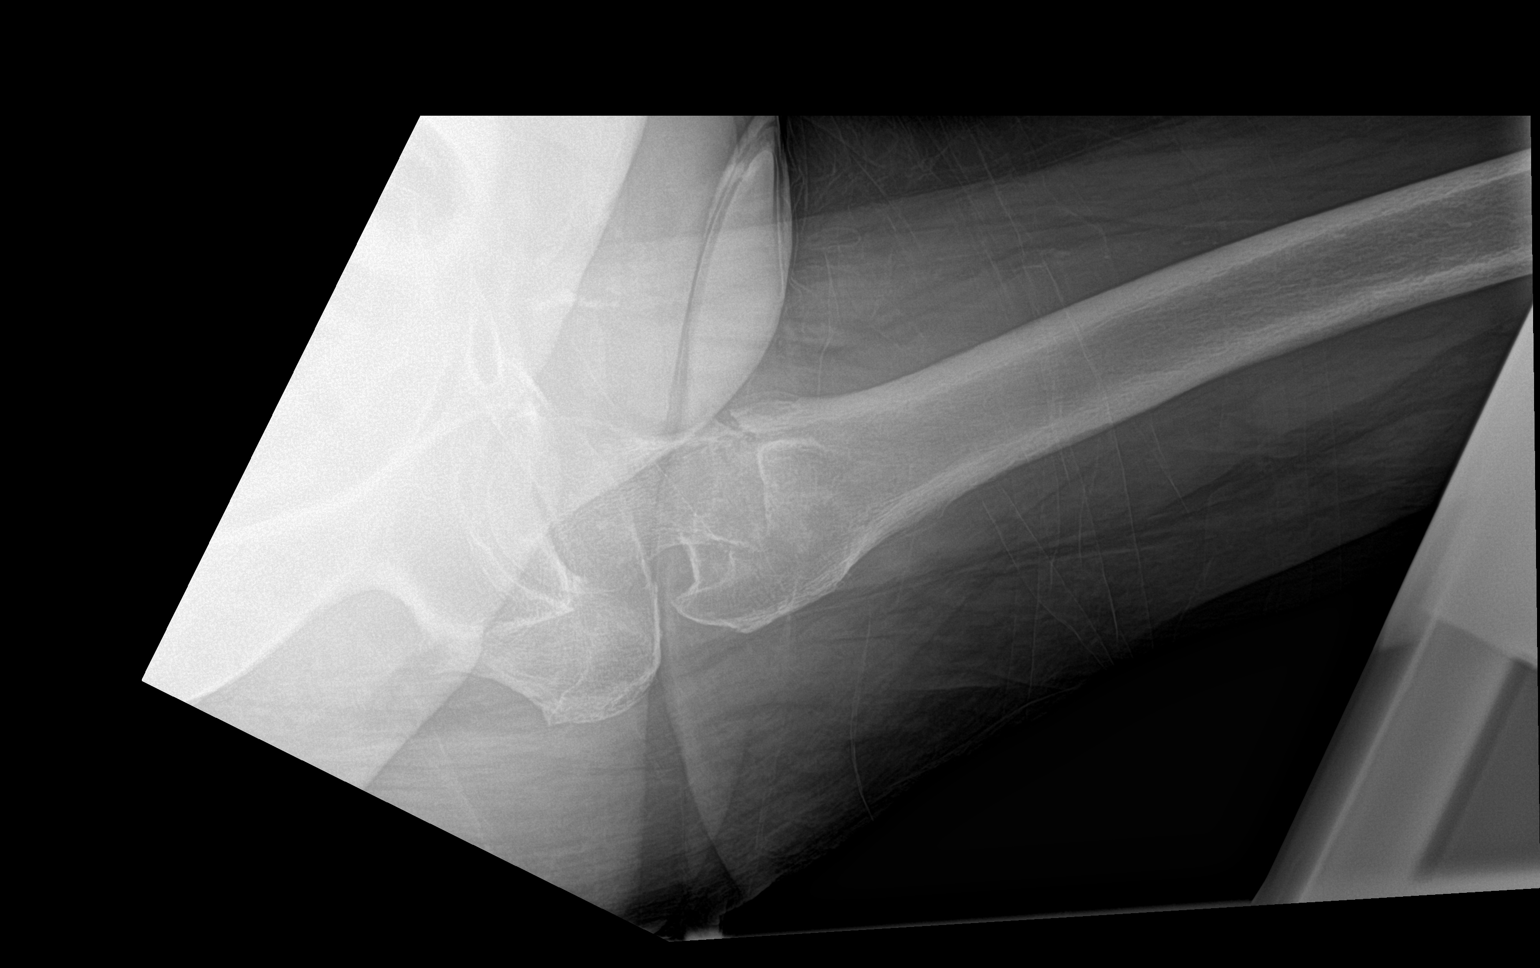

[3 of 3 positions shown; findings below may reference images not displayed]

FINDINGS: There is a nondisplaced fracture involving the greater trochanter of
the left femur. No definite intertrochanteric component is
identified. The left hip is located. Hip joint space widths are
preserved. No focal osseous lesion is seen.
IMPRESSION: Nondisplaced greater trochanteric fracture of the left femur.

## 2015-10-29 IMAGING — CT CT HIP*L* W/O CM
2 of 3 series · 17 of 46 positions shown, 19 images · non-contrast
Comparison: Radiograph [DATE]

CLINICAL DATA: Fall with left hip pain

EXAM:
CT OF THE LEFT HIP WITHOUT CONTRAST
TECHNIQUE: Multidetector CT imaging of the left hip was performed according to
the standard protocol. Multiplanar CT image reconstructions were
also generated.

[Series 3: axial st · axial · 0.46mm/px · z∈[-1243,-1057]mm · 14 of 107 slices shown, 16 images]
[im 7/107  soft-tissue]
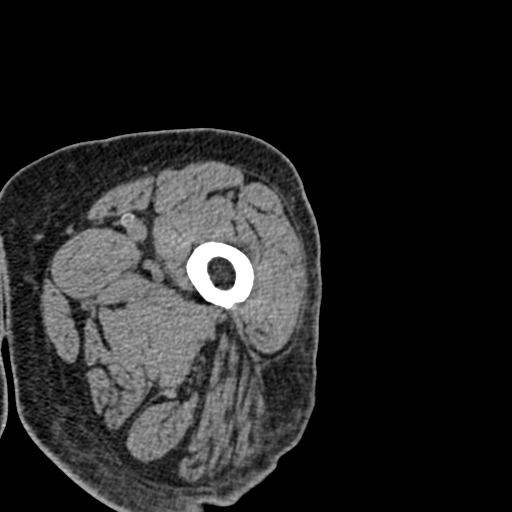
[im 7/107  bone]
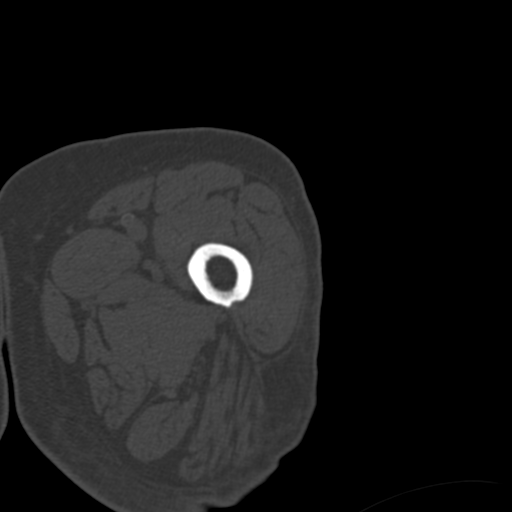
[im 14/107  soft-tissue]
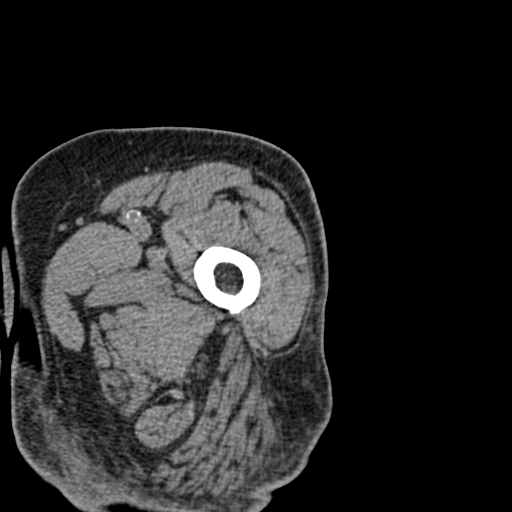
[im 21/107  soft-tissue]
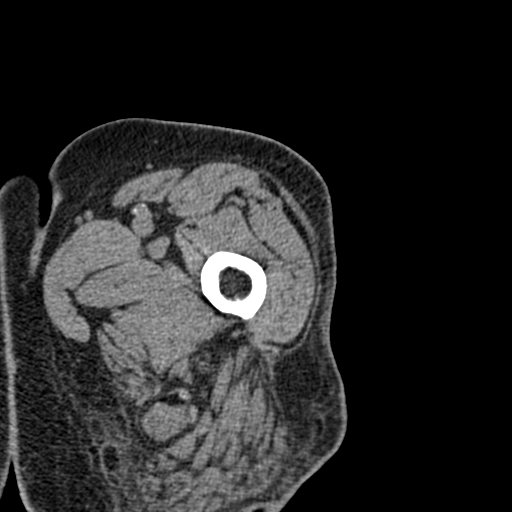
[im 28/107  soft-tissue]
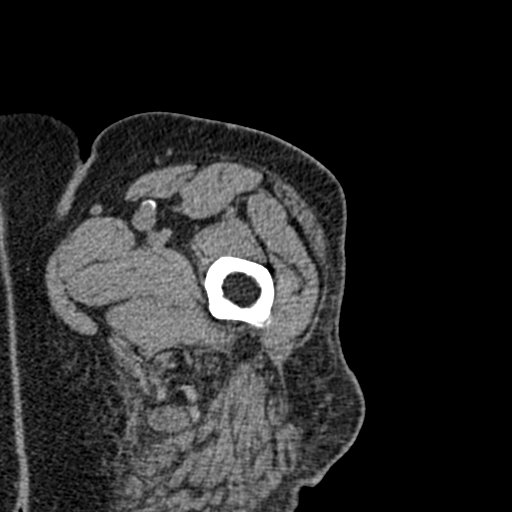
[im 35/107  soft-tissue]
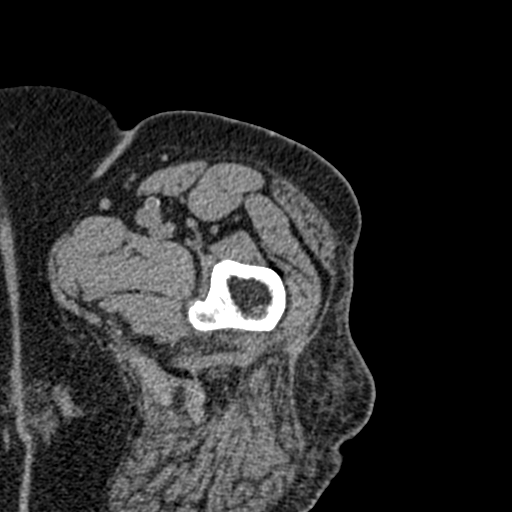
[im 42/107  soft-tissue]
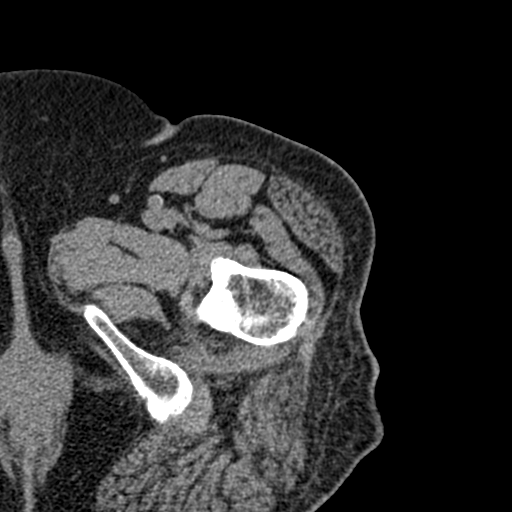
[im 48/107  soft-tissue]
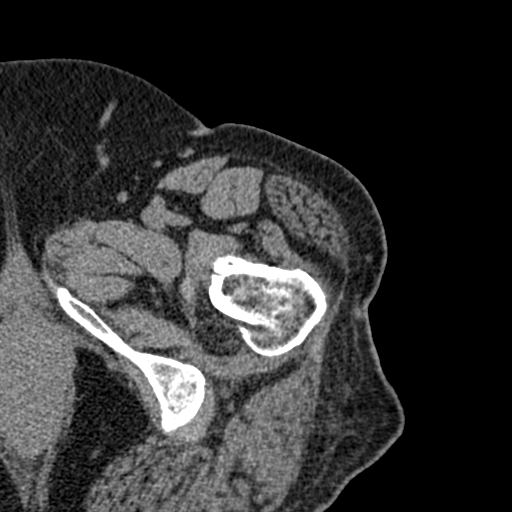
[im 59/107  soft-tissue]
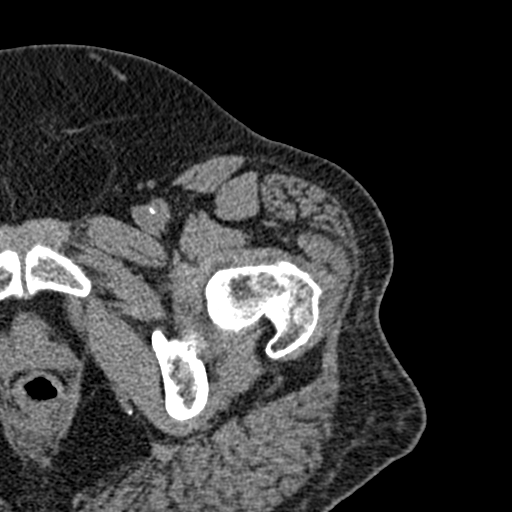
[im 65/107  soft-tissue]
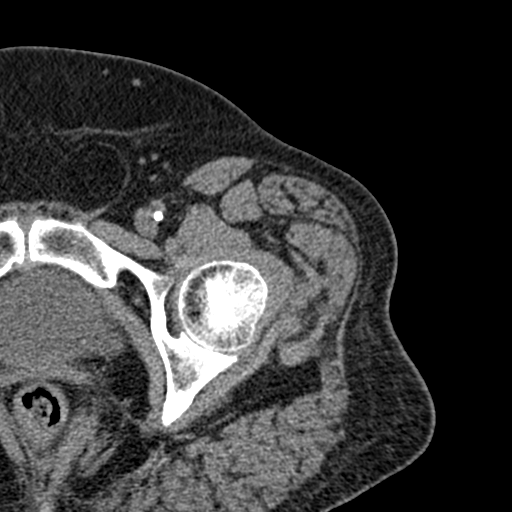
[im 65/107  bone]
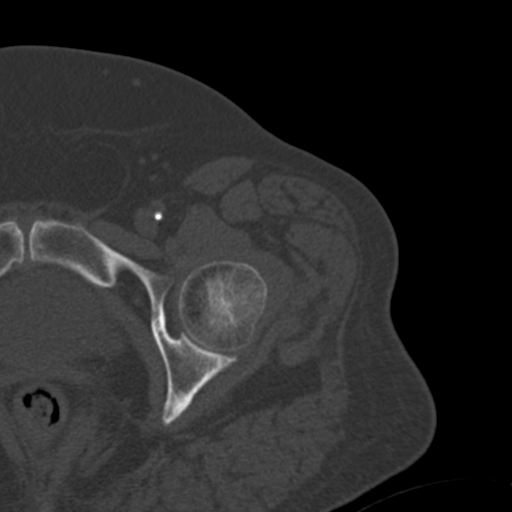
[im 72/107  soft-tissue]
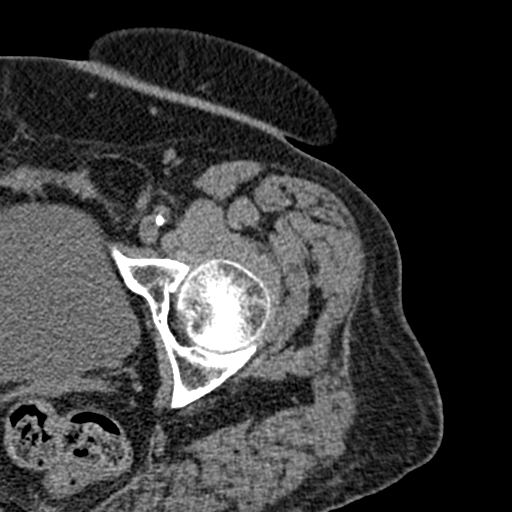
[im 79/107  soft-tissue]
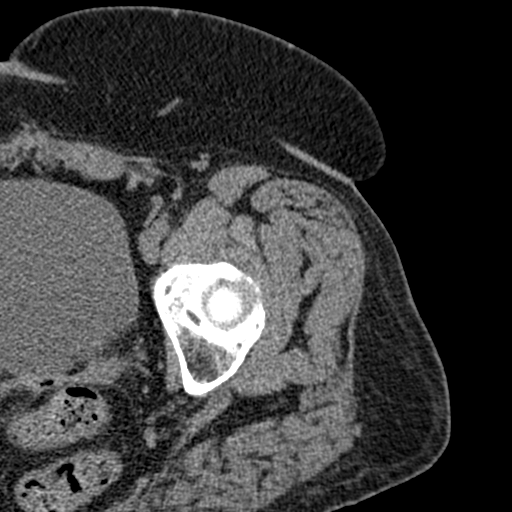
[im 86/107  soft-tissue]
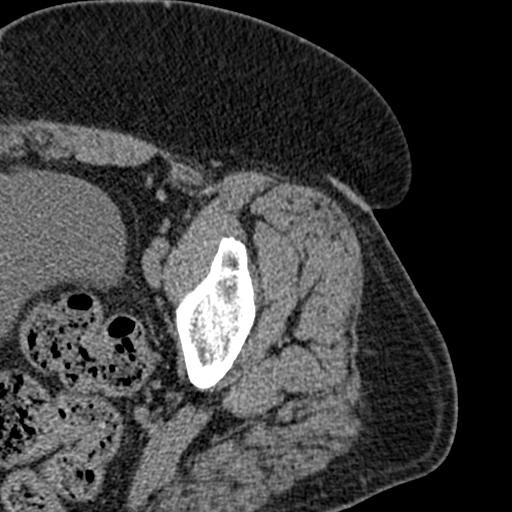
[im 93/107  soft-tissue]
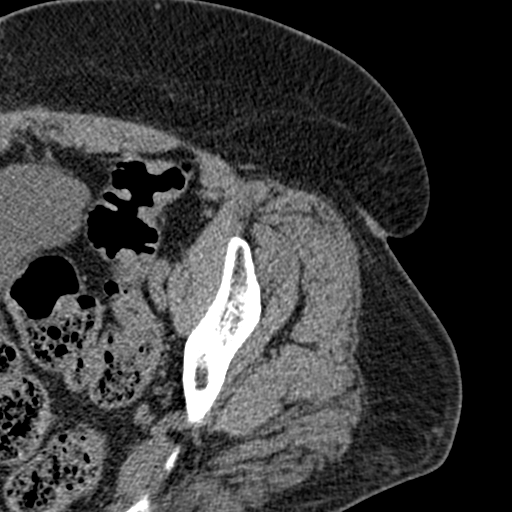
[im 100/107  soft-tissue]
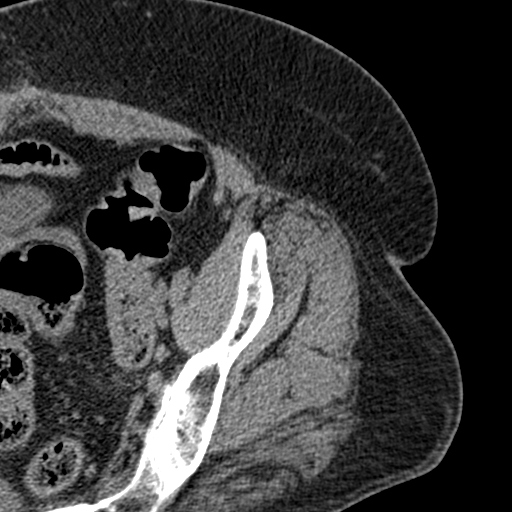

[Series 8: cor cor st · coronal · 0.41mm/px · 3 of 88 slices shown]
[im 30/88  soft-tissue]
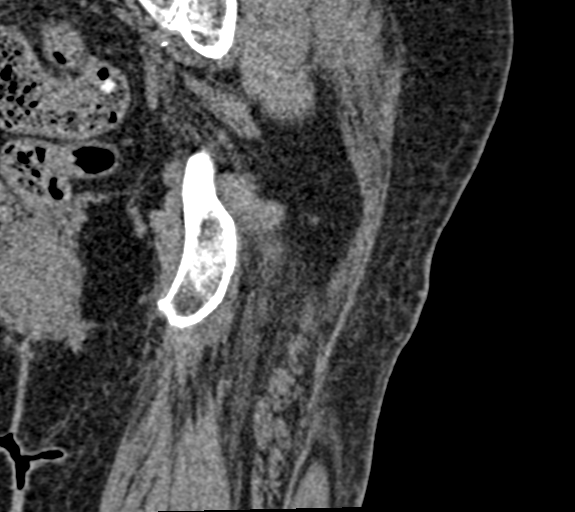
[im 39/88  soft-tissue]
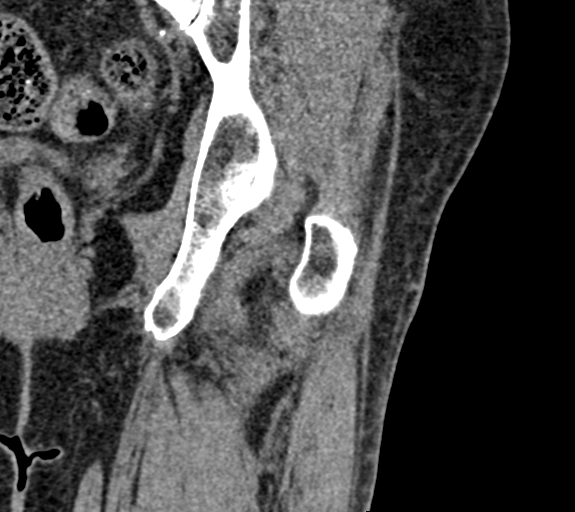
[im 49/88  soft-tissue]
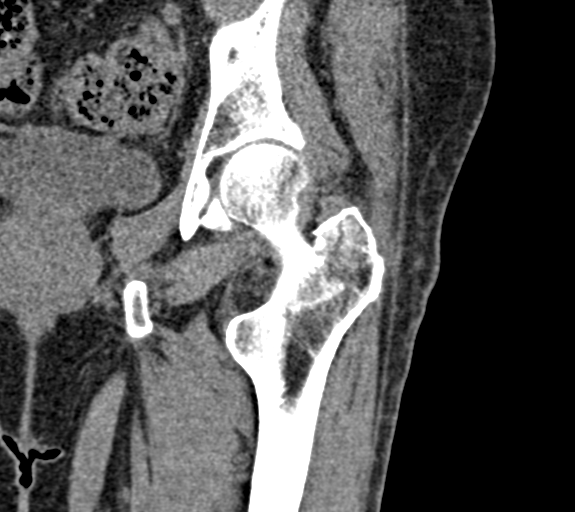

[17 of 46 positions shown; findings below may reference images not displayed]

FINDINGS: Bones/Joint/Cartilage

There is a mildly impacted fracture involving the greater trochanter
of the proximal left femur. This is contiguous with an obliquely
oriented fracture lucency along the anterior aspect of the femoral
trochanter. The lucency extends ports the lesser trochanter, however
no definite fracture lucency is seen within the lesser trochanter
itself. There is no significant angulation. There is a small
spiculated focus in the left femoral head which may represent a bone
island.

Inferior and superior pubic rami appear intact. There is no evidence
for acetabular fracture. Left SI joint appears within normal limits.

Ligaments

Suboptimally assessed by CT.

Muscles and Tendons

Minimal fatty atrophy of the muscles surrounding the left hip.

Soft tissues

Small amount of left femoral artery calcification. Limited
visualization of intrapelvic contents is unremarkable.
IMPRESSION: Mildly impacted fracture involving the greater trochanter of the
proximal left femur, fracture lucency extends obliquely along the
anterior aspect of the femoral trochanter and approaches the lesser
trochanter but there is no definite fracture lucency within the
lesser trochanter itself. Normal positioning of the left femoral
head.

## 2015-10-29 IMAGING — CR DG CHEST 1V
1 series · 1 of 1 positions shown · non-contrast
Comparison: None.

CLINICAL DATA: Pt comes into the ED via EMS from home c/o a fall
that is causing left hip pain. Denies any referred pain. Denies
getting dizzy, hitting head, LOC. Pt has hx of DM, HTN, GERD, former
[ZL] PPD x 20 years

EXAM:
CHEST 1 VIEW

[chest ap]
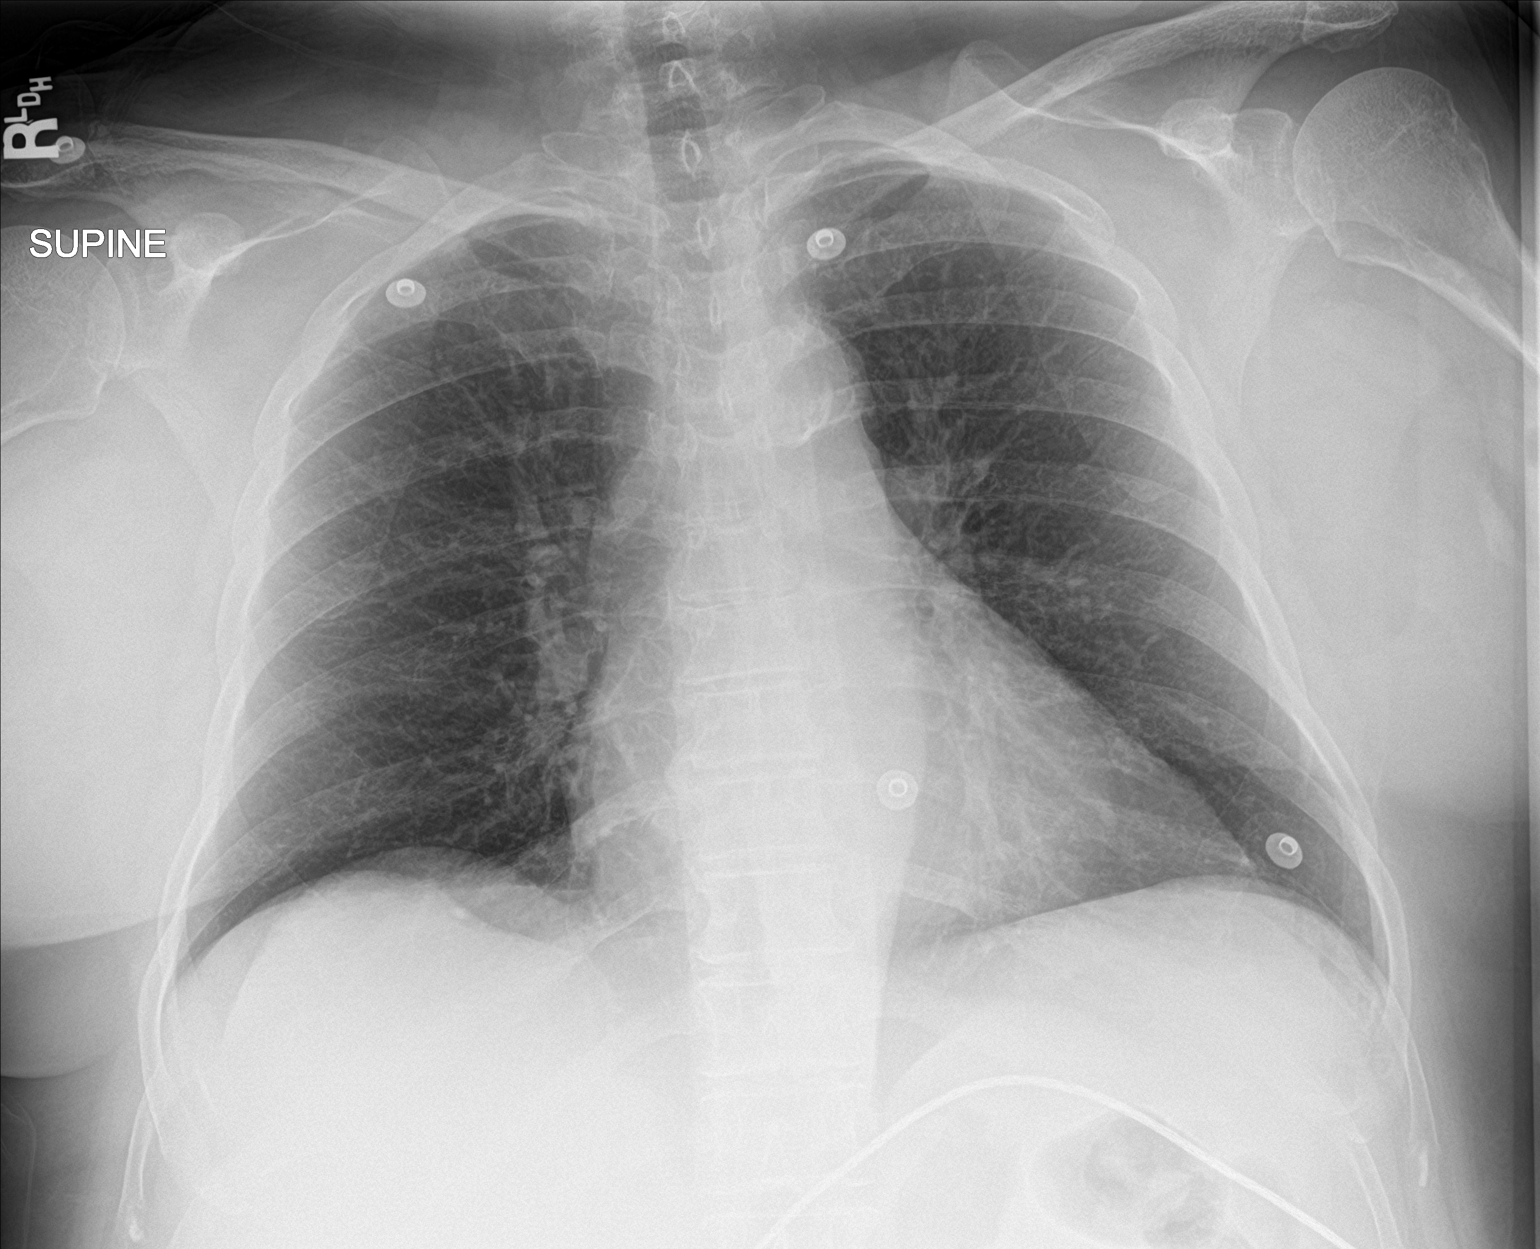

[1 of 1 positions shown; findings below may reference images not displayed]

FINDINGS: Cardiac silhouette is normal in size. No mediastinal or hilar
masses. No evidence of adenopathy.

Lungs are clear.  No pleural effusion or pneumothorax.

Skeletal structures are intact.
IMPRESSION: No active disease.

## 2015-10-29 MED ORDER — ACETAMINOPHEN 325 MG PO TABS
650.0000 mg | ORAL_TABLET | Freq: Four times a day (QID) | ORAL | Status: DC | PRN
  Administered 2015-10-30 – 2015-11-01 (×2): 650 mg via ORAL
  Filled 2015-10-29: qty 2

## 2015-10-29 MED ORDER — LEVOTHYROXINE SODIUM 100 MCG PO TABS: 100.0000 ug | ORAL_TABLET | Freq: Every day | ORAL | Status: DC

## 2015-10-29 MED ORDER — IRON-VITAMIN C 65-125 MG PO TABS: 1.0000 | ORAL_TABLET | Freq: Two times a day (BID) | ORAL | Status: DC

## 2015-10-29 MED ORDER — MORPHINE SULFATE (PF) 2 MG/ML IV SOLN
2.0000 mg | INTRAVENOUS | Status: DC | PRN
  Administered 2015-10-30 (×3): 2 mg via INTRAVENOUS
  Filled 2015-10-29 (×3): qty 1

## 2015-10-29 MED ORDER — ONDANSETRON HCL 4 MG/2ML IJ SOLN: 4.0000 mg | Freq: Four times a day (QID) | INTRAMUSCULAR | Status: DC | PRN

## 2015-10-29 MED ORDER — GEMFIBROZIL 600 MG PO TABS
600.0000 mg | ORAL_TABLET | Freq: Two times a day (BID) | ORAL | Status: DC
  Administered 2015-10-29 – 2015-11-01 (×5): 600 mg via ORAL
  Filled 2015-10-29 (×5): qty 1

## 2015-10-29 MED ORDER — DULOXETINE HCL 60 MG PO CPEP
60.0000 mg | ORAL_CAPSULE | Freq: Two times a day (BID) | ORAL | Status: DC
Start: 2015-10-29 — End: 2015-11-01
  Administered 2015-10-29 – 2015-11-01 (×5): 60 mg via ORAL
  Filled 2015-10-29 (×5): qty 1

## 2015-10-29 MED ORDER — INSULIN GLARGINE 100 UNIT/ML ~~LOC~~ SOLN
30.0000 [IU] | Freq: Every day | SUBCUTANEOUS | Status: DC
  Administered 2015-10-29 – 2015-10-31 (×3): 30 [IU] via SUBCUTANEOUS
  Filled 2015-10-29 (×4): qty 0.3

## 2015-10-29 MED ORDER — ASPIRIN EC 81 MG PO TBEC
81.0000 mg | DELAYED_RELEASE_TABLET | Freq: Every day | ORAL | Status: DC
  Administered 2015-10-31 – 2015-11-01 (×2): 81 mg via ORAL
  Filled 2015-10-29 (×2): qty 1

## 2015-10-29 MED ORDER — ACETAMINOPHEN 650 MG RE SUPP: 650.0000 mg | Freq: Four times a day (QID) | RECTAL | Status: DC | PRN

## 2015-10-29 MED ORDER — DOCUSATE SODIUM 100 MG PO CAPS
100.0000 mg | ORAL_CAPSULE | Freq: Two times a day (BID) | ORAL | Status: DC
  Administered 2015-10-29 – 2015-11-01 (×4): 100 mg via ORAL
  Filled 2015-10-29 (×5): qty 1

## 2015-10-29 MED ORDER — INSULIN ASPART 100 UNIT/ML ~~LOC~~ SOLN: 0.0000 [IU] | Freq: Every day | SUBCUTANEOUS | Status: DC

## 2015-10-29 MED ORDER — ZOLPIDEM TARTRATE 5 MG PO TABS: 10.0000 mg | ORAL_TABLET | Freq: Every day | ORAL | Status: DC

## 2015-10-29 MED ORDER — LEVOTHYROXINE SODIUM 100 MCG PO TABS
100.0000 ug | ORAL_TABLET | Freq: Every day | ORAL | Status: DC
  Administered 2015-10-31 – 2015-11-01 (×2): 100 ug via ORAL
  Filled 2015-10-29 (×3): qty 1

## 2015-10-29 MED ORDER — MORPHINE SULFATE (PF) 2 MG/ML IV SOLN
4.0000 mg | Freq: Once | INTRAVENOUS | Status: AC
  Administered 2015-10-29: 4 mg via INTRAVENOUS
  Filled 2015-10-29: qty 2

## 2015-10-29 MED ORDER — BUPROPION HCL ER (XL) 300 MG PO TB24
300.0000 mg | ORAL_TABLET | Freq: Every day | ORAL | Status: DC
  Administered 2015-10-31 – 2015-11-01 (×2): 300 mg via ORAL
  Filled 2015-10-29 (×2): qty 1

## 2015-10-29 MED ORDER — POLYETHYLENE GLYCOL 3350 17 G PO PACK: 17.0000 g | PACK | Freq: Every day | ORAL | Status: DC | PRN

## 2015-10-29 MED ORDER — ALPRAZOLAM 0.25 MG PO TABS: 0.2500 mg | ORAL_TABLET | Freq: Two times a day (BID) | ORAL | Status: DC | PRN

## 2015-10-29 MED ORDER — ONDANSETRON HCL 4 MG PO TABS
4.0000 mg | ORAL_TABLET | Freq: Four times a day (QID) | ORAL | Status: DC | PRN
Start: 2015-10-29 — End: 2015-11-01

## 2015-10-29 MED ORDER — INSULIN GLARGINE 300 UNIT/ML ~~LOC~~ SOPN: 30.0000 [IU] | PEN_INJECTOR | Freq: Every day | SUBCUTANEOUS | Status: DC

## 2015-10-29 MED ORDER — PANTOPRAZOLE SODIUM 40 MG PO TBEC
40.0000 mg | DELAYED_RELEASE_TABLET | Freq: Two times a day (BID) | ORAL | Status: DC
  Administered 2015-10-29 – 2015-11-01 (×5): 40 mg via ORAL
  Filled 2015-10-29 (×5): qty 1

## 2015-10-29 MED ORDER — ZOLPIDEM TARTRATE 5 MG PO TABS
5.0000 mg | ORAL_TABLET | Freq: Every day | ORAL | Status: DC
  Administered 2015-10-29 – 2015-10-31 (×3): 5 mg via ORAL
  Filled 2015-10-29 (×3): qty 1

## 2015-10-29 MED ORDER — NEBIVOLOL HCL 5 MG PO TABS
5.0000 mg | ORAL_TABLET | Freq: Every day | ORAL | Status: DC
  Administered 2015-10-30 – 2015-11-01 (×3): 5 mg via ORAL
  Filled 2015-10-29 (×3): qty 1

## 2015-10-29 MED ORDER — HYDROMORPHONE HCL 1 MG/ML IJ SOLN
2.0000 mg | Freq: Once | INTRAMUSCULAR | Status: AC
  Administered 2015-10-29: 2 mg via INTRAVENOUS
  Filled 2015-10-29: qty 2

## 2015-10-29 MED ORDER — SODIUM CHLORIDE 0.9 % IV SOLN
INTRAVENOUS | Status: DC
  Administered 2015-10-29 – 2015-10-30 (×2): via INTRAVENOUS

## 2015-10-29 MED ORDER — FAMOTIDINE 20 MG PO TABS: 20.0000 mg | ORAL_TABLET | Freq: Every day | ORAL | Status: DC

## 2015-10-29 MED ORDER — INSULIN ASPART 100 UNIT/ML ~~LOC~~ SOLN
0.0000 [IU] | Freq: Three times a day (TID) | SUBCUTANEOUS | Status: DC
  Administered 2015-10-30 – 2015-10-31 (×4): 2 [IU] via SUBCUTANEOUS
  Filled 2015-10-29 (×6): qty 2

## 2015-10-29 MED ORDER — HYDROCODONE-ACETAMINOPHEN 5-325 MG PO TABS
1.0000 | ORAL_TABLET | ORAL | Status: DC | PRN
  Administered 2015-10-29 – 2015-10-30 (×3): 1 via ORAL
  Administered 2015-10-31: 2 via ORAL
  Administered 2015-10-31: 1 via ORAL
  Administered 2015-10-31 (×2): 2 via ORAL
  Filled 2015-10-29: qty 2
  Filled 2015-10-29: qty 1
  Filled 2015-10-29: qty 2
  Filled 2015-10-29 (×3): qty 1
  Filled 2015-10-29 (×2): qty 2

## 2015-10-29 MED ORDER — ATORVASTATIN CALCIUM 20 MG PO TABS
20.0000 mg | ORAL_TABLET | Freq: Every day | ORAL | Status: DC
  Administered 2015-10-29 – 2015-10-31 (×3): 20 mg via ORAL
  Filled 2015-10-29 (×3): qty 1

## 2015-10-29 MED ORDER — ONDANSETRON HCL 4 MG/2ML IJ SOLN
4.0000 mg | Freq: Once | INTRAMUSCULAR | Status: AC
  Administered 2015-10-29: 4 mg via INTRAVENOUS
  Filled 2015-10-29: qty 2

## 2015-10-29 NOTE — H&P (Signed)
[facility] [place] [facility]   PATIENT NAME: Cheryl Leonard    MR#:  [id number]  DATE OF BIRTH:  07/23/1945  DATE OF ADMISSION:  10/29/2015  PRIMARY CARE PHYSICIAN: Danella PentonMark F Miller, MD   REQUESTING/REFERRING PHYSICIAN: Dr. Gladstone Pihavid Schaevitz  CHIEF COMPLAINT:   Chief Complaint  Patient presents with  . Fall  . Hip Pain    HISTORY OF PRESENT ILLNESS:  Cheryl KellsVeronica Leonard  is a 70 y.o. female with a known history of Hypertension, hyperlipidemia, diabetes mellitus-insulin-dependent, degenerative arthritis presents to the hospital secondary to fall and left hip pain. Patient has been doing well, in her normal state of health. Independent at baseline. She was walking in her kitchen and suddenly her left leg gave out and she fell onto her left side. Due to intense pain she was brought to the emergency room. Unable to bear weight at this time. X-ray shows nondisplaced left greater trochanter fracture. Nonsurgical at this time. Orthopedics was consult that and they have recommended a CT of the hip and admitted for pain control and physical therapy. Patient denies any chest pain, hitting her head or loss of consciousness during that episode. No nausea or vomiting, no fevers or chills.  PAST MEDICAL HISTORY:   Past Medical History:  Diagnosis Date  . Arthritis   . Cataracts, both eyes   . GERD (gastroesophageal reflux disease)   . Gout   . History of fracture of patella    right knee  . History of positive PPD    Patient always shows positive  . Hyperlipidemia   . Hypertension   . Hypothyroidism   . Lichen sclerosus 12/30/2013   of vulva  . Metatarsal fracture   . Polyneuropathy (HCC)    numbness and tingling in feet and toes  . Type 2 diabetes mellitus, uncontrolled (HCC)     PAST SURGICAL HISTORY:   Past Surgical History:  Procedure Laterality Date  . APPENDECTOMY    . BREAST REDUCTION SURGERY  2001  . CATARACT EXTRACTION    . CESAREAN SECTION   1976  . COLONOSCOPY  03/05/2013   Nml - due for repeat 03/06/2018  . DILATION AND CURETTAGE OF UTERUS  1989  . ESOPHAGOGASTRODUODENOSCOPY  03/05/2013  . Eyelid Surgery  2012  . LAPAROSCOPIC HYSTERECTOMY  2000   total  . REDUCTION MAMMAPLASTY  1997    SOCIAL HISTORY:   Social History  Substance Use Topics  . Smoking status: Former Smoker    Packs/day: 1.00    Years: 20.00    Types: Cigarettes  . Smokeless tobacco: Never Used     Comment: started smoking at age 70  . Alcohol use No    FAMILY HISTORY:   Family History  Problem Relation Age of Onset  . Coronary artery disease Father   . Heart attack Father   . Coronary artery disease Mother   . Heart attack Mother   . Ovarian cancer Sister 2043    sister had hormonal therapy for IVF txs-which increased risk factor for ovarian cancer    DRUG ALLERGIES:  No Known Allergies  REVIEW OF SYSTEMS:   Review of Systems  Constitutional: Negative for chills, fever, malaise/fatigue and weight loss.  HENT: Negative for ear discharge, ear pain, hearing loss and nosebleeds.   Eyes: Negative for blurred vision, double vision and photophobia.  Respiratory: Negative for cough, hemoptysis, shortness of breath and wheezing.   Cardiovascular: Negative for chest pain, palpitations, orthopnea and leg swelling.  Gastrointestinal:  Negative for abdominal pain, constipation, diarrhea, heartburn, melena, nausea and vomiting.  Genitourinary: Negative for dysuria, frequency, hematuria and urgency.  Musculoskeletal: Positive for joint pain and myalgias. Negative for back pain and neck pain.  Skin: Negative for rash.  Neurological: Negative for dizziness, tingling, tremors, sensory change, speech change, focal weakness and headaches.  Endo/Heme/Allergies: Does not bruise/bleed easily.  Psychiatric/Behavioral: Negative for depression.    MEDICATIONS AT HOME:   Prior to Admission medications   Medication Sig Start Date End Date Taking? Authorizing  Provider  ALPRAZolam Prudy Feeler) 0.25 MG tablet Take 1 tablet by mouth as needed. For sleep    Historical Provider, MD  aspirin EC 81 MG tablet Take 1 tablet by mouth daily.    Historical Provider, MD  atorvastatin (LIPITOR) 20 MG tablet Take 20 mg by mouth at bedtime. 08/18/14   Historical Provider, MD  B-D UF III MINI PEN NEEDLES 31G X 5 MM MISC daily. 09/17/14   Historical Provider, MD  buPROPion (WELLBUTRIN XL) 300 MG 24 hr tablet Take 1 tablet by mouth daily. 09/06/14   Historical Provider, MD  BYSTOLIC 5 MG tablet Take 5 mg by mouth daily. 08/30/14   Historical Provider, MD  DULoxetine (CYMBALTA) 60 MG capsule Take 1 capsule by mouth 2 (two) times daily. 06/17/14   Historical Provider, MD  gemfibrozil (LOPID) 600 MG tablet Take 600 mg by mouth 2 (two) times daily. 08/25/14   Historical Provider, MD  Iron-Vitamin C (VITRON-C) 65-125 MG TABS Take 1 tablet by mouth 2 (two) times daily.     Historical Provider, MD  JANUMET 50-1000 MG per tablet Take 1 tablet by mouth 2 (two) times daily with a meal. 08/10/14   Historical Provider, MD  levothyroxine (SYNTHROID, LEVOTHROID) 100 MCG tablet Take 100 mcg by mouth daily. 08/03/14   Historical Provider, MD  lisinopril (PRINIVIL,ZESTRIL) 10 MG tablet Take 1 tablet by mouth daily.    Historical Provider, MD  pantoprazole (PROTONIX) 40 MG tablet Take 40 mg by mouth 2 (two) times daily.    Historical Provider, MD  ranitidine (ZANTAC) 300 MG capsule Take 300 mg by mouth at bedtime. 07/15/14   Historical Provider, MD  TOUJEO SOLOSTAR 300 UNIT/ML SOPN Inject 30 Units into the skin at bedtime. 08/25/14   Historical Provider, MD  zolpidem (AMBIEN) 10 MG tablet Take 10 mg by mouth at bedtime. 07/09/14   Historical Provider, MD      VITAL SIGNS:  Blood pressure 138/85, pulse 69, temperature 98.3 F (36.8 C), temperature source Oral, resp. rate 18, height 5\' 5"  (1.651 m), weight 72.6 kg (160 lb), SpO2 98 %.  PHYSICAL EXAMINATION:   Physical Exam  GENERAL:  70 y.o.-year-old  patient lying in the bed with no acute distress.  EYES: Pupils equal, round, reactive to light and accommodation. No scleral icterus. Extraocular muscles intact.  HEENT: Head atraumatic, normocephalic. Oropharynx and nasopharynx clear.  NECK:  Supple, no jugular venous distention. No thyroid enlargement, no tenderness.  LUNGS: Normal breath sounds bilaterally, no wheezing, rales,rhonchi or crepitation. No use of accessory muscles of respiration.  CARDIOVASCULAR: S1, S2 normal. No murmurs, rubs, or gallops.  ABDOMEN: Soft, nontender, nondistended. Bowel sounds present. No organomegaly or mass.  EXTREMITIES: No pedal edema, cyanosis, or clubbing. Left bigtoe nail bleeding. Left leg guarded with no external rotation or abduction. NEUROLOGIC: Cranial nerves II through XII are intact. Muscle strength 5/5 in all extremities except left leg as its being guarded due to pain. Sensation intact. Gait not checked.  PSYCHIATRIC: The  patient is alert and oriented x 3.  SKIN: No obvious rash, lesion, or ulcer.   LABORATORY PANEL:   CBC  Recent Labs Lab 10/29/15 1721  WBC 12.9*  HGB 12.0  HCT 35.0  PLT 338   ------------------------------------------------------------------------------------------------------------------  Chemistries  No results for input(s): NA, K, CL, CO2, GLUCOSE, BUN, CREATININE, CALCIUM, MG, AST, ALT, ALKPHOS, BILITOT in the last 168 hours.  Invalid input(s): GFRCGP ------------------------------------------------------------------------------------------------------------------  Cardiac Enzymes No results for input(s): TROPONINI in the last 168 hours. ------------------------------------------------------------------------------------------------------------------  RADIOLOGY:  Dg Chest 1 View  Result Date: 10/29/2015 CLINICAL DATA:  Pt comes into the ED via EMS from home c/o a fall that is causing left hip pain. Denies any referred pain. Denies getting dizzy, hitting  head, LOC. Pt has hx of DM, HTN, GERD, former smoker-1 PPD x 20 years EXAM: CHEST 1 VIEW COMPARISON:  None. FINDINGS: Cardiac silhouette is normal in size. No mediastinal or hilar masses. No evidence of adenopathy. Lungs are clear.  No pleural effusion or pneumothorax. Skeletal structures are intact. IMPRESSION: No active disease. Electronically Signed   By: Amie Portland M.D.   On: 10/29/2015 16:32   Dg Hip Unilat W Or Wo Pelvis 2-3 Views Left  Result Date: 10/29/2015 CLINICAL DATA:  Fall.  Left hip pain.  Initial encounter. EXAM: DG HIP (WITH OR WITHOUT PELVIS) 2-3V LEFT COMPARISON:  None. FINDINGS: There is a nondisplaced fracture involving the greater trochanter of the left femur. No definite intertrochanteric component is identified. The left hip is located. Hip joint space widths are preserved. No focal osseous lesion is seen. IMPRESSION: Nondisplaced greater trochanteric fracture of the left femur. Electronically Signed   By: Sebastian Ache M.D.   On: 10/29/2015 16:42    EKG:   Orders placed or performed during the hospital encounter of 10/29/15  . ED EKG  . ED EKG  . EKG 12-Lead  . EKG 12-Lead    IMPRESSION AND PLAN:   Rakisha Pincock  is a 70 y.o. female with a known history of Hypertension, hyperlipidemia, diabetes mellitus-insulin-dependent, degenerative arthritis presents to the hospital secondary to fall and left hip pain.  #1 Left greater trochanteric fracture- nondisplaced, likely will not surgery unless its extending on to the intertrochanteric region - CT of the hip ordered - ortho consulted- Dr. Rosita Kea notified - pain control, physical Therapy consult, if surgery is not needed  #2 diabetes mellitus-continue 30 units long-acting insulin at bedtime. Add sliding scale insulin. Follows up with an endocrinologist. Most recent A1c 6.2  #3 GERD-on Protonix twice a day  #4 hyperlipidemia-continue gemfibrozil  #5 hypertension-continue Bystolic. Hold lisinopril as potassium is  slightly elevated.  #6 DVT prophylaxis-currently on hold until CT results are available. If no surgery is needed, then will start Lovenox    All the records are reviewed and case discussed with ED provider. Management plans discussed with the patient, family and they are in agreement.  CODE STATUS: Full Code  TOTAL TIME TAKING CARE OF THIS PATIENT: 50 minutes.    Nikko Quast M.D on 10/29/2015 at 5:56 PM  Between 7am to 6pm - Pager - [phone]  After 6pm go to [email or web address] - Social research officer, government  [facility]  Office  [phone]  CC: Primary care physician; Danella Penton, MD

## 2015-10-29 NOTE — ED Triage Notes (Signed)
Pt comes into the ED via EMS from home c/o a fall that is causing left hip pain.  Denies any referred pain, no shortening or external rotation.  Denies getting dizzy, hitting head, LOC.  No blood thinner use.  100 mcg of fentanyl given in route.  Patient in NAD at this time with even and unlabored respirations.

## 2015-10-29 NOTE — ED Notes (Signed)
Meal tray given to patient.

## 2015-10-29 NOTE — ED Notes (Signed)
Attempted to call report.  RN unavailable at this time and will return my call.  

## 2015-10-29 NOTE — ED Provider Notes (Signed)
[facility] Emergency Department Provider Note   ____________________________________________   First MD Initiated Contact with Patient 10/29/15 1545     (approximate)  I have reviewed the triage vital signs and the nursing notes.   HISTORY  Chief Complaint Fall and Hip Pain   HPI Cheryl Leonard is a 70 y.o. female with a history of peripheral neuropathy as well as hypertension is present in the emergency department with left hip pain after a fall. The patient says that she was walking in her garage when she had a mechanical fall. She said that she did not pass out. Denies any chest pain or shortness of breath. Is unclear exactly how she fell but says that she remembers the whole thing. Denies hitting her head or losing consciousness. Denies being on any blood thinners.Lid on her left side and since has been having pain to the left hip.   Past Medical History:  Diagnosis Date  . Arthritis   . Cataracts, both eyes   . GERD (gastroesophageal reflux disease)   . Gout   . History of fracture of patella    right knee  . History of positive PPD    Patient always shows positive  . Hyperlipidemia   . Hypertension   . Hypothyroidism   . Lichen sclerosus 12/30/2013   of vulva  . Metatarsal fracture   . Polyneuropathy (HCC)    numbness and tingling in feet and toes  . Type 2 diabetes mellitus, uncontrolled (HCC)     Patient Active Problem List   Diagnosis Date Noted  . Iron deficiency anemia 09/22/2014  . Disease of thyroid gland 09/22/2014  . Diabetes mellitus type 2, uncontrolled (HCC) 05/13/2014    Past Surgical History:  Procedure Laterality Date  . APPENDECTOMY    . BREAST REDUCTION SURGERY  2001  . CATARACT EXTRACTION    . CESAREAN SECTION  1976  . COLONOSCOPY  03/05/2013   Nml - due for repeat 03/06/2018  . DILATION AND CURETTAGE OF UTERUS  1989  . ESOPHAGOGASTRODUODENOSCOPY  03/05/2013  . Eyelid Surgery  2012  . LAPAROSCOPIC  HYSTERECTOMY  2000   total  . REDUCTION MAMMAPLASTY  1997    Prior to Admission medications   Medication Sig Start Date End Date Taking? Authorizing Provider  ALPRAZolam Prudy Feeler) 0.25 MG tablet Take 1 tablet by mouth as needed. For sleep    Historical Provider, MD  aspirin EC 81 MG tablet Take 1 tablet by mouth daily.    Historical Provider, MD  atorvastatin (LIPITOR) 20 MG tablet Take 20 mg by mouth at bedtime. 08/18/14   Historical Provider, MD  B-D UF III MINI PEN NEEDLES 31G X 5 MM MISC daily. 09/17/14   Historical Provider, MD  buPROPion (WELLBUTRIN XL) 300 MG 24 hr tablet Take 1 tablet by mouth daily. 09/06/14   Historical Provider, MD  BYSTOLIC 5 MG tablet Take 5 mg by mouth daily. 08/30/14   Historical Provider, MD  [employer] ALCOHOL SWABS PADS daily. as directed 09/01/14   Historical Provider, MD  DULoxetine (CYMBALTA) 60 MG capsule Take 1 capsule by mouth 2 (two) times daily. 06/17/14   Historical Provider, MD  gemfibrozil (LOPID) 600 MG tablet Take 600 mg by mouth 2 (two) times daily. 08/25/14   Historical Provider, MD  Iron-Vitamin C (VITRON-C) 65-125 MG TABS Take 1 tablet by mouth 2 (two) times daily.     Historical Provider, MD  JANUMET 50-1000 MG per tablet Take 1 tablet by mouth 2 (  two) times daily with a meal. 08/10/14   Historical Provider, MD  levothyroxine (SYNTHROID, LEVOTHROID) 100 MCG tablet Take 100 mcg by mouth daily. 08/03/14   Historical Provider, MD  lisinopril (PRINIVIL,ZESTRIL) 10 MG tablet Take 1 tablet by mouth daily.    Historical Provider, MD  pantoprazole (PROTONIX) 40 MG tablet Take 40 mg by mouth 2 (two) times daily.    Historical Provider, MD  ranitidine (ZANTAC) 300 MG capsule Take 300 mg by mouth at bedtime. 07/15/14   Historical Provider, MD  TOUJEO SOLOSTAR 300 UNIT/ML SOPN Inject 30 Units into the skin at bedtime. 08/25/14   Historical Provider, MD  zolpidem (AMBIEN) 10 MG tablet Take 10 mg by mouth at bedtime. 07/09/14   Historical Provider, MD    Allergies Review of  patient's allergies indicates no known allergies.  Family History  Problem Relation Age of Onset  . Coronary artery disease Father   . Heart attack Father   . Coronary artery disease Mother   . Heart attack Mother   . Ovarian cancer Sister 50    sister had hormonal therapy for IVF txs-which increased risk factor for ovarian cancer    Social History Social History  Substance Use Topics  . Smoking status: Former Smoker    Packs/day: 1.00    Years: 20.00    Types: Cigarettes  . Smokeless tobacco: Never Used     Comment: started smoking at age 58  . Alcohol use No    Review of Systems Constitutional: No fever/chills Eyes: No visual changes. ENT: No sore throat. Cardiovascular: Denies chest pain. Respiratory: Denies shortness of breath. Gastrointestinal: No abdominal pain.  No nausea, no vomiting.  No diarrhea.  No constipation. Genitourinary: Negative for dysuria. Musculoskeletal: Negative for back pain. Skin: Negative for rash. Neurological: Negative for headaches, focal weakness or numbness.  10-point ROS otherwise negative.  ____________________________________________   PHYSICAL EXAM:  VITAL SIGNS: ED Triage Vitals  Enc Vitals Group     BP 10/29/15 1542 138/85     Pulse Rate 10/29/15 1542 69     Resp 10/29/15 1542 18     Temp 10/29/15 1542 98.3 F (36.8 C)     Temp Source 10/29/15 1542 Oral     SpO2 10/29/15 1542 98 %     Weight 10/29/15 1543 160 lb (72.6 kg)     Height 10/29/15 1543 5\' 5"  (1.651 m)     Head Circumference --      Peak Flow --      Pain Score 10/29/15 1543 0     Pain Loc --      Pain Edu? --      Excl. in GC? --     Constitutional: Alert and oriented. Well appearing and in no acute distress. Eyes: Conjunctivae are normal. PERRL. EOMI. Head: Atraumatic. Nose: No congestion/rhinnorhea. Mouth/Throat: Mucous membranes are moist.   Neck: No stridor.   Cardiovascular: Normal rate, regular rhythm. Grossly normal heart sounds.    Respiratory: Normal respiratory effort.  No retractions. Lungs CTAB. Gastrointestinal: Soft and nontender. No distention.  Musculoskeletal: Tenderness to the lateral left hip. Patient with restricted range of motion due to pain laterally on the hip. There is no shortening or deformity or rotation of the left lower extremity. Distal to the injury the patient is neurovascularly intact. There are dorsalis pedis pulses which are intact, equal and bilateral. The patient has sensation to light touch was intact bilaterally. Neurologic:  Normal speech and language. No gross focal neurologic deficits are  appreciated. No gait instability. Skin:  Skin is warm, dry and intact. No rash noted. Psychiatric: Mood and affect are normal. Speech and behavior are normal.  ____________________________________________   LABS (all labs ordered are listed, but only abnormal results are displayed)  Labs Reviewed  CBC WITH DIFFERENTIAL/PLATELET  BASIC METABOLIC PANEL  PROTIME-INR  APTT  TYPE AND SCREEN   ____________________________________________  EKG  ED ECG REPORT I, Schaevitz,  Teena Iraniavid M, the attending physician, personally viewed and interpreted this ECG.   Date: 10/29/2015  EKG Time: 1540  Rate: 71  Rhythm: normal sinus rhythm  Axis: Normal axis  Intervals:none  ST&T Change: No ST segment elevation or depression. No abnormal T-wave inversion.  ____________________________________________  RADIOLOGY  Lab Results   None  Imaging Results     DG Hip Unilat W or Wo Pelvis 2-3 Views Left (Final result)  Result time 10/29/15 16:42:27  Final result by Sebastian AcheAllen Grady, MD (10/29/15 16:42:27)           Narrative:   CLINICAL DATA: Fall. Left hip pain. Initial encounter.  EXAM: DG HIP (WITH OR WITHOUT PELVIS) 2-3V LEFT  COMPARISON: None.  FINDINGS: There is a nondisplaced fracture involving the greater trochanter of the left femur. No definite intertrochanteric component  is identified. The left hip is located. Hip joint space widths are preserved. No focal osseous lesion is seen.  IMPRESSION: Nondisplaced greater trochanteric fracture of the left femur.   Electronically Signed By: Sebastian AcheAllen Grady M.D. On: 10/29/2015 16:42            DG Chest 1 View (Final result)  Result time 10/29/15 16:32:29  Final result by Amie Portlandavid Ormond, MD (10/29/15 16:32:29)           Narrative:   CLINICAL DATA: Pt comes into the ED via EMS from home c/o a fall that is causing left hip pain. Denies any referred pain. Denies getting dizzy, hitting head, LOC. Pt has hx of DM, HTN, GERD, former smoker-1 PPD x 20 years  EXAM: CHEST 1 VIEW  COMPARISON: None.  FINDINGS: Cardiac silhouette is normal in size. No mediastinal or hilar masses. No evidence of adenopathy.  Lungs are clear. No pleural effusion or pneumothorax.  Skeletal structures are intact.  IMPRESSION: No active disease.   Electronically Signed By: Amie Portlandavid Ormond M.D. On: 10/29/2015 16:32            ____________________________________________   PROCEDURES  Procedure(s) performed:   Procedures  Critical Care performed:   ____________________________________________   INITIAL IMPRESSION / ASSESSMENT AND PLAN / ED COURSE  Pertinent labs & imaging results that were available during my care of the patient were reviewed by me and considered in my medical decision making (see chart for details).  ----------------------------------------- 5:23 PM on 10/29/2015 -----------------------------------------  Case discussed with Dr. Rosita KeaMenz of the orthopedic service and says that the treatment for this type of fracture is likely nonoperative. He said the patient may be able to go home if she is able to ambulate. However, the patient says that she can barely move her leg and will not be able to stand. I'll give her noticed pain medications but feel she will need admission to hospital.  Discussed with the patient as well as the family who would like to be admitted to the hospital. Signed out to Dr. Allena KatzPatel to medicine service.  Clinical Course     ____________________________________________   FINAL CLINICAL IMPRESSION(S) / ED DIAGNOSES  Fall. Left-sided hip fracture.    NEW MEDICATIONS STARTED DURING  THIS VISIT:  New Prescriptions   No medications on file     Note:  This document was prepared using Dragon voice recognition software and may include unintentional dictation errors.    Myrna Blazer, MD 10/29/15 [phone]

## 2015-10-30 ENCOUNTER — Encounter
Admission: EM | Disposition: A | Discharge status: Skilled Nursing Facility | Payer: Self-pay | Source: Home / Self Care | Attending: Internal Medicine

## 2015-10-30 ENCOUNTER — Inpatient Hospital Stay: Payer: Medicare Other | Admitting: Anesthesiology

## 2015-10-30 ENCOUNTER — Encounter: Payer: Self-pay | Admitting: *Deleted

## 2015-10-30 ENCOUNTER — Inpatient Hospital Stay: Payer: Medicare Other

## 2015-10-30 DIAGNOSIS — Z79899 Other long term (current) drug therapy: Secondary | ICD-10-CM | POA: Diagnosis not present

## 2015-10-30 DIAGNOSIS — Z8041 Family history of malignant neoplasm of ovary: Secondary | ICD-10-CM | POA: Diagnosis not present

## 2015-10-30 DIAGNOSIS — E785 Hyperlipidemia, unspecified: Secondary | ICD-10-CM | POA: Diagnosis present

## 2015-10-30 DIAGNOSIS — Z8249 Family history of ischemic heart disease and other diseases of the circulatory system: Secondary | ICD-10-CM | POA: Diagnosis not present

## 2015-10-30 DIAGNOSIS — E039 Hypothyroidism, unspecified: Secondary | ICD-10-CM | POA: Diagnosis present

## 2015-10-30 DIAGNOSIS — M25552 Pain in left hip: Secondary | ICD-10-CM | POA: Diagnosis present

## 2015-10-30 DIAGNOSIS — K219 Gastro-esophageal reflux disease without esophagitis: Secondary | ICD-10-CM | POA: Diagnosis present

## 2015-10-30 DIAGNOSIS — W19XXXA Unspecified fall, initial encounter: Secondary | ICD-10-CM | POA: Diagnosis present

## 2015-10-30 DIAGNOSIS — J9811 Atelectasis: Secondary | ICD-10-CM | POA: Diagnosis not present

## 2015-10-30 DIAGNOSIS — J189 Pneumonia, unspecified organism: Secondary | ICD-10-CM | POA: Diagnosis not present

## 2015-10-30 DIAGNOSIS — Z87891 Personal history of nicotine dependence: Secondary | ICD-10-CM | POA: Diagnosis not present

## 2015-10-30 DIAGNOSIS — E119 Type 2 diabetes mellitus without complications: Secondary | ICD-10-CM | POA: Diagnosis present

## 2015-10-30 DIAGNOSIS — S72009A Fracture of unspecified part of neck of unspecified femur, initial encounter for closed fracture: Secondary | ICD-10-CM | POA: Diagnosis present

## 2015-10-30 DIAGNOSIS — Z7982 Long term (current) use of aspirin: Secondary | ICD-10-CM | POA: Diagnosis not present

## 2015-10-30 DIAGNOSIS — W1830XA Fall on same level, unspecified, initial encounter: Secondary | ICD-10-CM | POA: Diagnosis present

## 2015-10-30 DIAGNOSIS — Z794 Long term (current) use of insulin: Secondary | ICD-10-CM | POA: Diagnosis not present

## 2015-10-30 DIAGNOSIS — I10 Essential (primary) hypertension: Secondary | ICD-10-CM | POA: Diagnosis present

## 2015-10-30 DIAGNOSIS — Y92010 Kitchen of single-family (private) house as the place of occurrence of the external cause: Secondary | ICD-10-CM | POA: Diagnosis not present

## 2015-10-30 DIAGNOSIS — S72115A Nondisplaced fracture of greater trochanter of left femur, initial encounter for closed fracture: Secondary | ICD-10-CM | POA: Diagnosis present

## 2015-10-30 HISTORY — PX: INTRAMEDULLARY (IM) NAIL INTERTROCHANTERIC: SHX5875

## 2015-10-30 LAB — GLUCOSE, CAPILLARY
GLUCOSE-CAPILLARY: 109 mg/dL — AB (ref 65–99)
GLUCOSE-CAPILLARY: 100 mg/dL — AB (ref 65–99)
GLUCOSE-CAPILLARY: 80 mg/dL (ref 65–99)
GLUCOSE-CAPILLARY: 195 mg/dL — AB (ref 65–99)
GLUCOSE-CAPILLARY: 159 mg/dL — AB (ref 65–99)

## 2015-10-30 LAB — BASIC METABOLIC PANEL
Anion gap: 8 (ref 5–15)
BUN: 22 mg/dL — ABNORMAL HIGH (ref 6–20)
CHLORIDE: 105 mmol/L (ref 101–111)
CO2: 24 mmol/L (ref 22–32)
Calcium: 9 mg/dL (ref 8.9–10.3)
Creatinine, Ser: 1.2 mg/dL — ABNORMAL HIGH (ref 0.44–1.00)
GFR calc non Af Amer: 45 mL/min — ABNORMAL LOW (ref >60)
GFR, EST AFRICAN AMERICAN: 52 mL/min — AB (ref >60)
Glucose, Bld: 152 mg/dL — ABNORMAL HIGH (ref 65–99)
POTASSIUM: 4.2 mmol/L (ref 3.5–5.1)
SODIUM: 137 mmol/L (ref 135–145)

## 2015-10-30 LAB — APTT: aPTT: 27 seconds (ref 24–36)

## 2015-10-30 IMAGING — CR DG HIP (WITH PELVIS) OPERATIVE*L*
1 series · 1 of 1 positions shown · non-contrast
Comparison: [DATE]

CLINICAL DATA: Postop left femur fracture

EXAM:
OPERATIVE left HIP (WITH PELVIS IF PERFORMED) 3 VIEWS
TECHNIQUE: Fluoroscopic spot image(s) were submitted for interpretation
post-operatively.

[1]
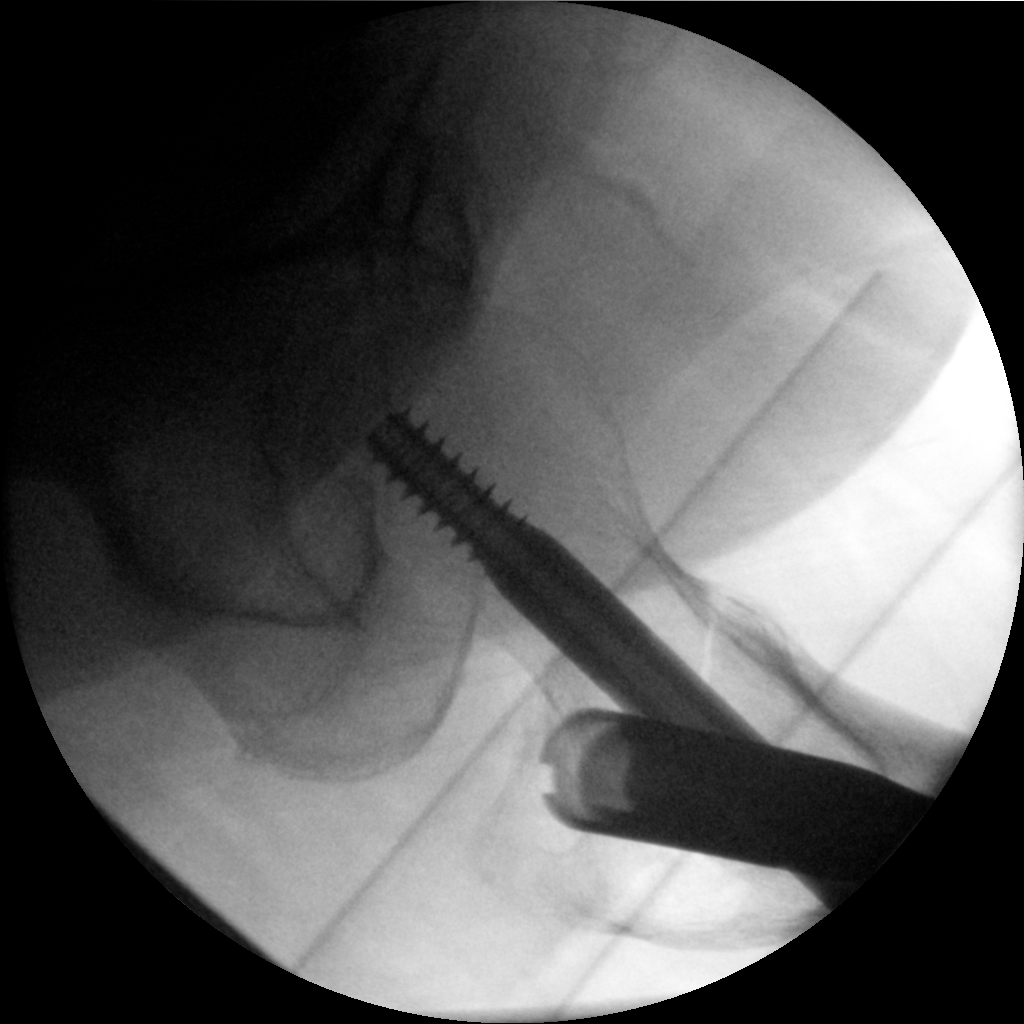

[1 of 1 positions shown; findings below may reference images not displayed]

FINDINGS: Three views of the left hip submitted. The patient is status post
intraoperative repair of left femur fracture. There is intra
medullary rod proximal left femur. Metallic locking pin noted left
femoral neck. There is anatomic alignment.
IMPRESSION: Status post intraoperative repair of proximal left femur fracture.
Intra medullary rod and locking pin noted in proximal left femur
with anatomic alignment.

Fluoroscopy time 55 seconds.  Please see the operative report.

## 2015-10-30 SURGERY — FIXATION, FRACTURE, INTERTROCHANTERIC, WITH INTRAMEDULLARY ROD
Anesthesia: Spinal | Site: Hip | Laterality: Left | Wound class: Clean

## 2015-10-30 MED ORDER — SODIUM CHLORIDE 0.9 % IV SOLN
INTRAVENOUS | Status: DC | PRN
  Administered 2015-10-30 (×2): via INTRAVENOUS

## 2015-10-30 MED ORDER — MAGNESIUM CITRATE PO SOLN
1.0000 | Freq: Once | ORAL | Status: DC | PRN
  Filled 2015-10-30: qty 296

## 2015-10-30 MED ORDER — PROPOFOL 500 MG/50ML IV EMUL
INTRAVENOUS | Status: DC | PRN
  Administered 2015-10-30: 50 ug/kg/min via INTRAVENOUS

## 2015-10-30 MED ORDER — ENOXAPARIN SODIUM 40 MG/0.4ML ~~LOC~~ SOLN
40.0000 mg | SUBCUTANEOUS | Status: DC
  Administered 2015-10-31 – 2015-11-01 (×2): 40 mg via SUBCUTANEOUS
  Filled 2015-10-30 (×2): qty 0.4

## 2015-10-30 MED ORDER — CEFAZOLIN SODIUM-DEXTROSE 2-4 GM/100ML-% IV SOLN
2.0000 g | Freq: Four times a day (QID) | INTRAVENOUS | Status: AC
  Administered 2015-10-30 – 2015-10-31 (×3): 2 g via INTRAVENOUS
  Filled 2015-10-30 (×3): qty 100

## 2015-10-30 MED ORDER — MIDAZOLAM HCL 5 MG/5ML IJ SOLN
INTRAMUSCULAR | Status: DC | PRN
  Administered 2015-10-30: 2 mg via INTRAVENOUS

## 2015-10-30 MED ORDER — PHENOL 1.4 % MT LIQD
1.0000 | OROMUCOSAL | Status: DC | PRN
Start: 2015-10-30 — End: 2015-11-01
  Filled 2015-10-30: qty 177

## 2015-10-30 MED ORDER — SODIUM CHLORIDE 0.9 % IV SOLN
INTRAVENOUS | Status: DC
  Administered 2015-10-31: 01:00:00 via INTRAVENOUS

## 2015-10-30 MED ORDER — NEOMYCIN-POLYMYXIN B GU 40-200000 IR SOLN
Status: DC | PRN
  Administered 2015-10-30: 2 mL

## 2015-10-30 MED ORDER — METOCLOPRAMIDE HCL 5 MG/ML IJ SOLN
5.0000 mg | Freq: Three times a day (TID) | INTRAMUSCULAR | Status: DC | PRN
Start: 2015-10-30 — End: 2015-11-01

## 2015-10-30 MED ORDER — CEFAZOLIN IN D5W 1 GM/50ML IV SOLN
1.0000 g | Freq: Once | INTRAVENOUS | Status: AC
  Administered 2015-10-30: 1 g via INTRAVENOUS
  Filled 2015-10-30: qty 50

## 2015-10-30 MED ORDER — MENTHOL 3 MG MT LOZG
1.0000 | LOZENGE | OROMUCOSAL | Status: DC | PRN
  Filled 2015-10-30: qty 9

## 2015-10-30 MED ORDER — BUPIVACAINE HCL (PF) 0.5 % IJ SOLN: INTRAMUSCULAR | Status: DC | PRN

## 2015-10-30 MED ORDER — METOCLOPRAMIDE HCL 10 MG PO TABS
5.0000 mg | ORAL_TABLET | Freq: Three times a day (TID) | ORAL | Status: DC | PRN
  Administered 2015-10-30: 10 mg via ORAL
  Filled 2015-10-30: qty 1

## 2015-10-30 MED ORDER — DEXTROSE 5 % IV SOLN: INTRAVENOUS | Status: AC

## 2015-10-30 MED ORDER — ONDANSETRON HCL 4 MG/2ML IJ SOLN: 4.0000 mg | Freq: Once | INTRAMUSCULAR | Status: DC | PRN

## 2015-10-30 MED ORDER — FENTANYL CITRATE (PF) 100 MCG/2ML IJ SOLN: 25.0000 ug | INTRAMUSCULAR | Status: DC | PRN

## 2015-10-30 MED ORDER — MAGNESIUM HYDROXIDE 400 MG/5ML PO SUSP
30.0000 mL | Freq: Every day | ORAL | Status: DC | PRN
  Filled 2015-10-30: qty 30

## 2015-10-30 MED ORDER — LIDOCAINE HCL (CARDIAC) 20 MG/ML IV SOLN
INTRAVENOUS | Status: DC | PRN
  Administered 2015-10-30: 60 mg via INTRAVENOUS

## 2015-10-30 MED ORDER — KETAMINE HCL 50 MG/ML IJ SOLN
INTRAMUSCULAR | Status: DC | PRN
  Administered 2015-10-30 (×2): 12.5 mg via INTRAMUSCULAR

## 2015-10-30 MED ORDER — BISACODYL 10 MG RE SUPP: 10.0000 mg | Freq: Every day | RECTAL | Status: DC | PRN

## 2015-10-30 MED ORDER — BUPIVACAINE HCL (PF) 0.5 % IJ SOLN
INTRAMUSCULAR | Status: DC | PRN
  Administered 2015-10-30: 2.8 mL

## 2015-10-30 MED ORDER — ALUM & MAG HYDROXIDE-SIMETH 200-200-20 MG/5ML PO SUSP: 30.0000 mL | ORAL | Status: DC | PRN

## 2015-10-30 SURGICAL SUPPLY — 35 items
BIT DRILL CANN LG 4.3MM (BIT) ×1 IMPLANT
CANISTER SUCT 1200ML W/VALVE (MISCELLANEOUS) ×3 IMPLANT
CHLORAPREP W/TINT 26ML (MISCELLANEOUS) ×3 IMPLANT
DRAPE SHEET LG 3/4 BI-LAMINATE (DRAPES) ×3 IMPLANT
DRAPE SURG 17X11 SM STRL (DRAPES) ×3 IMPLANT
DRAPE U-SHAPE 47X51 STRL (DRAPES) ×3 IMPLANT
DRILL BIT CANN LG 4.3MM (BIT) ×3
DRSG OPSITE POSTOP 4X6 (GAUZE/BANDAGES/DRESSINGS) ×9 IMPLANT
ELECT REM PT RETURN 9FT ADLT (ELECTROSURGICAL) ×3
ELECTRODE REM PT RTRN 9FT ADLT (ELECTROSURGICAL) ×1 IMPLANT
GAUZE SPONGE 4X4 12PLY STRL (GAUZE/BANDAGES/DRESSINGS) ×3 IMPLANT
GLOVE BIOGEL PI IND STRL 9 (GLOVE) ×1 IMPLANT
GLOVE BIOGEL PI INDICATOR 9 (GLOVE) ×2
GLOVE SURG SYN 9.0  PF PI (GLOVE) ×2
GLOVE SURG SYN 9.0 PF PI (GLOVE) ×1 IMPLANT
GOWN SRG 2XL LVL 4 RGLN SLV (GOWNS) ×1 IMPLANT
GOWN STRL NON-REIN 2XL LVL4 (GOWNS) ×2
GOWN STRL REUS W/ TWL LRG LVL3 (GOWN DISPOSABLE) ×1 IMPLANT
GOWN STRL REUS W/TWL LRG LVL3 (GOWN DISPOSABLE) ×2
GUIDEPIN VERSANAIL DSP 3.2X444 ×3 IMPLANT
IV NS 500ML (IV SOLUTION) ×2
IV NS 500ML BAXH (IV SOLUTION) ×1 IMPLANT
KIT RM TURNOVER STRD PROC AR (KITS) ×3 IMPLANT
MAT BLUE FLOOR 46X72 FLO (MISCELLANEOUS) ×3 IMPLANT
NAIL HIP FRACT 130D 11X180 (Screw) ×3 IMPLANT
NEEDLE FILTER BLUNT 18X 1/2SAF (NEEDLE) ×2
NEEDLE FILTER BLUNT 18X1 1/2 (NEEDLE) ×1 IMPLANT
PACK HIP COMPR (MISCELLANEOUS) ×3 IMPLANT
SCREW BONE CORTICAL 5.0X38 (Screw) ×3 IMPLANT
SCREW LAG HIP NAIL 10.5X95 (Screw) ×3 IMPLANT
STAPLER SKIN PROX 35W (STAPLE) ×3 IMPLANT
SUT VIC AB 1 CT1 36 (SUTURE) ×3 IMPLANT
SUT VIC AB 2-0 CT1 (SUTURE) ×3 IMPLANT
SYRINGE 10CC LL (SYRINGE) ×3 IMPLANT
TAPE MICROFOAM 4IN (TAPE) ×3 IMPLANT

## 2015-10-30 NOTE — Op Note (Signed)
10/29/2015 - 10/30/2015  1:22 PM  PATIENT:  Cheryl Leonard  70 y.o. female  PRE-OPERATIVE DIAGNOSIS:  left hip fracture, intertrochanteric nondisplaced  POST-OPERATIVE DIAGNOSIS:  left hip fracture intertrochanteric nondisplaced  PROCEDURE:  Procedure(s): INTRAMEDULLARY (IM) NAIL INTERTROCHANTRIC  (Left)  SURGEON: Leitha SchullerMichael J Hollie Bartus, MD  ASSISTANTS: None  ANESTHESIA:   spinal  EBL:  Total I/O In: 1000 [I.V.:1000] Out: 400 [Urine:250; Blood:150]  BLOOD ADMINISTERED:none  DRAINS: none   LOCAL MEDICATIONS USED:  NONE  SPECIMEN:  No Specimen  DISPOSITION OF SPECIMEN:  N/A  COUNTS:  YES  TOURNIQUET:  * No tourniquets in log *  IMPLANTS: Biomet affixes 11 x 1 80 rod with 95 mm lag screw 38 mm interlocking screw  DICTATION: .Dragon Dictation patient brought the operating room and after adequate anesthesia was obtained she was transferred to the fracture table. Right leg was then well-leg holder left foot in the traction boot with internal rotation applied. C-arm was brought in and good visualization of the hip was obtained. The hip was prepped and draped in usual usual Barrier drape method and after appropriate patient identification and timeout procedures, small incision was made at the level of the greater trochanter. A guidewire was inserted into the tip of the trochanter in a center position on the lateral view. Proximal reaming was carried out and the rod was inserted to the appropriate depth. Small lateral incision was made and the drill sleeve inserted with guide were inserted into the center center position on the head. Measurements made off of this drilling carried out and the 95 mm lag screw inserted to the appropriate depth. Locking screws placed proximally to keep it from rotating out with a quarter turn to loosen to allow for compression. The distal interlocking screw was then placed through the drill sleeve with a small incision made and a 38 mm screw inserted after  drilling across both cortices. The insertion device was removed and a permanent AP lateral proximal and AP distal x-rays were obtained. The wounds were irrigated. Wounds were closed with 2-0 Vicryl subcutaneously after deep closure proximally of the 0 Vicryl. Skin staples followed by Xeroform and honeycomb dressing was applied  PLAN OF CARE: Continue as inpatient  PATIENT DISPOSITION:  PACU - hemodynamically stable.

## 2015-10-30 NOTE — Progress Notes (Signed)
PT Cancellation Note  Patient Details Name: Cheryl PurserVeronica S Leonard MRN: [id number] DOB: 05/06/1945   Cancelled Treatment:    Reason Eval/Treat Not Completed: Medical issues which prohibited therapy. Chart reviewed. Per ortho plan is for ORIF. Will need new order with WB status after surgery. If new order received PT evaluation will be performed POD#1 per protocol and as patient is appropriate.   Sharalyn InkJason D Woodrow Drab PT, DPT   Norvil Martensen 10/30/2015, 8:24 AM

## 2015-10-30 NOTE — [place] FL2 (Signed)
[place] MEDICAID FL2 LEVEL OF CARE SCREENING TOOL     IDENTIFICATION  Patient Name: Cheryl PurserVeronica S Leonard Birthdate: 08/15/1945 Sex: female Admission Date (Current Location): 10/29/2015  [place]   Facility and Address:  [facility], [address], [place], [place] [address]      Provider Number: [id number]  Attending Physician Name and Address:  Wyatt Hasteavid K Hower, MD  Relative Name and Phone Number:       Current Level of Care: Hospital Recommended Level of Care: Skilled Nursing Facility Prior Approval Number:    Date Approved/Denied:   PASRR Number:  [id number] [id number])  Discharge Plan: SNF    Current Diagnoses: Patient Active Problem List   Diagnosis Date Noted  . Hip fracture (HCC) 10/30/2015  . Greater trochanter fracture (HCC) 10/29/2015  . Iron deficiency anemia 09/22/2014  . Disease of thyroid gland 09/22/2014  . Diabetes mellitus type 2, uncontrolled (HCC) 05/13/2014    Orientation RESPIRATION BLADDER Height & Weight     Self, Time, Situation, Place  O2 (2 Liters Oxygen ) Continent Weight: 160 lb (72.6 kg) Height:  5\' 5"  (165.1 cm)  BEHAVIORAL SYMPTOMS/MOOD NEUROLOGICAL BOWEL NUTRITION STATUS   (none)  (none) Continent Diet (Diet: Clear Liquid )  AMBULATORY STATUS COMMUNICATION OF NEEDS Skin   Extensive Assist Verbally Surgical wounds (Incision: Left Hip. )                       Personal Care Assistance Level of Assistance  Bathing, Feeding, Dressing Bathing Assistance: Limited assistance Feeding assistance: Independent Dressing Assistance: Limited assistance     Functional Limitations Info  Sight, Hearing, Speech Sight Info: Adequate Hearing Info: Adequate Speech Info: Adequate    SPECIAL CARE FACTORS FREQUENCY  PT (By licensed PT), OT (By licensed OT)     PT Frequency:  (5) OT Frequency:  (5)            Contractures      Additional Factors Info  Code Status, Allergies, Insulin  Sliding Scale Code Status Info:  (Full Code. ) Allergies Info:  (No Known Allergies. )   Insulin Sliding Scale Info:  (NovoLog Insuling Injections 3 times per day. )       Current Medications (10/30/2015):  This is the current hospital active medication list Current Facility-Administered Medications  Medication Dose Route Frequency Provider Last Rate Last Dose  . 0.9 %  sodium chloride infusion   Intravenous Continuous Kennedy BuckerMichael Menz, MD      . acetaminophen (TYLENOL) tablet 650 mg  650 mg Oral Q6H PRN Enid Baasadhika Kalisetti, MD       Or  . acetaminophen (TYLENOL) suppository 650 mg  650 mg Rectal Q6H PRN Enid Baasadhika Kalisetti, MD      . ALPRAZolam Prudy Feeler(XANAX) tablet 0.25 mg  0.25 mg Oral BID PRN Enid Baasadhika Kalisetti, MD      . alum & mag hydroxide-simeth (MAALOX/MYLANTA) 200-200-20 MG/5ML suspension 30 mL  30 mL Oral Q4H PRN Kennedy BuckerMichael Menz, MD      . aspirin EC tablet 81 mg  81 mg Oral Daily Enid Baasadhika Kalisetti, MD      . atorvastatin (LIPITOR) tablet 20 mg  20 mg Oral QHS Enid Baasadhika Kalisetti, MD   20 mg at 10/29/15 2253  . bisacodyl (DULCOLAX) suppository 10 mg  10 mg Rectal Daily PRN Kennedy BuckerMichael Menz, MD      . buPROPion (WELLBUTRIN XL) 24 hr tablet 300 mg  300 mg Oral Daily Enid Baasadhika Kalisetti, MD      .  ceFAZolin (ANCEF) IVPB 2g/100 mL premix  2 g Intravenous Q6H Kennedy Bucker, MD      . dextrose 5 % solution   Intravenous Continuous Wyatt Haste, MD      . docusate sodium (COLACE) capsule 100 mg  100 mg Oral BID Enid Baas, MD   100 mg at 10/29/15 2253  . DULoxetine (CYMBALTA) DR capsule 60 mg  60 mg Oral BID Enid Baas, MD   60 mg at 10/29/15 2253  . [START ON 10/31/2015] enoxaparin (LOVENOX) injection 40 mg  40 mg Subcutaneous Q24H Kennedy Bucker, MD      . gemfibrozil (LOPID) tablet 600 mg  600 mg Oral BID Enid Baas, MD   600 mg at 10/29/15 2253  . HYDROcodone-acetaminophen (NORCO/VICODIN) 5-325 MG per tablet 1-2 tablet  1-2 tablet Oral Q4H PRN Enid Baas, MD   1 tablet at 10/30/15  0556  . insulin aspart (novoLOG) injection 0-5 Units  0-5 Units Subcutaneous QHS Enid Baas, MD      . insulin aspart (novoLOG) injection 0-9 Units  0-9 Units Subcutaneous TID WC Enid Baas, MD      . insulin glargine (LANTUS) injection 30 Units  30 Units Subcutaneous QHS Enid Baas, MD   30 Units at 10/29/15 2254  . levothyroxine (SYNTHROID, LEVOTHROID) tablet 100 mcg  100 mcg Oral QAC breakfast Enid Baas, MD      . magnesium citrate solution 1 Bottle  1 Bottle Oral Once PRN Kennedy Bucker, MD      . magnesium hydroxide (MILK OF MAGNESIA) suspension 30 mL  30 mL Oral Daily PRN Kennedy Bucker, MD      . menthol-cetylpyridinium (CEPACOL) lozenge 3 mg  1 lozenge Oral PRN Kennedy Bucker, MD       Or  . phenol (CHLORASEPTIC) mouth spray 1 spray  1 spray Mouth/Throat PRN Kennedy Bucker, MD      . metoCLOPramide (REGLAN) tablet 5-10 mg  5-10 mg Oral Q8H PRN Kennedy Bucker, MD       Or  . metoCLOPramide (REGLAN) injection 5-10 mg  5-10 mg Intravenous Q8H PRN Kennedy Bucker, MD      . morphine 2 MG/ML injection 2-3 mg  2-3 mg Intravenous Q3H PRN Enid Baas, MD   2 mg at 10/30/15 1045  . nebivolol (BYSTOLIC) tablet 5 mg  5 mg Oral Daily Enid Baas, MD   5 mg at 10/30/15 1025  . ondansetron (ZOFRAN) tablet 4 mg  4 mg Oral Q6H PRN Enid Baas, MD       Or  . ondansetron (ZOFRAN) injection 4 mg  4 mg Intravenous Q6H PRN Enid Baas, MD      . pantoprazole (PROTONIX) EC tablet 40 mg  40 mg Oral BID Enid Baas, MD   40 mg at 10/29/15 2302  . polyethylene glycol (MIRALAX / GLYCOLAX) packet 17 g  17 g Oral Daily PRN Enid Baas, MD      . zolpidem (AMBIEN) tablet 5 mg  5 mg Oral QHS Enid Baas, MD   5 mg at 10/29/15 2253     Discharge Medications: Please see discharge summary for a list of discharge medications.  Relevant Imaging Results:  Relevant Lab Results:   Additional Information  (SSN: [id number])  Sample, Darleen Crocker,  LCSW

## 2015-10-30 NOTE — Clinical Social Work Note (Signed)
Clinical Social Work Assessment  Patient Details  Name: Cheryl Leonard MRN: [id number] Date of Birth: 05/04/1945  Date of referral:  10/30/15               Reason for consult:  Facility Placement                Permission sought to share information with:  Facility Contact Representative Permission granted to share information::  Yes, Verbal Permission Granted  Name::      Skilled Nursing Facility V.S home health   Agency::     Relationship::     Contact Information:     Housing/Transportation Living arrangements for the past 2 months:  Single Family Home Source of Information:  Patient, Adult Children, Spouse Patient Interpreter Needed:  None Criminal Activity/Legal Involvement Pertinent to Current Situation/Hospitalization:  No - Comment as needed Significant Relationships:  Adult Children, Spouse Lives with:  Spouse Do you feel safe going back to the place where you live?  Yes Need for family participation in patient care:  Yes (Comment)  Care giving concerns:  Patient lives in [place] with her husband Cheryl Leonard.    Social Worker assessment / plan:  Clinical Social Worker (CSW) reviewed chart and noted that patient is having surgery today. Per RN case manager patient changed from observation to inpatient. CSW met with patient and her husband Cheryl Leonard and son Cheryl Leonard prior to surgery. CSW introduced self and explained role of CSW department. Per patient she lives in [place] with her husband and has a supportive family. Per patient she fell at home, which resulted in this fracture. CSW explained that PT will work with patient after surgery and make a recommendation of home health or SNF. SNF list was provided. CSW explained that patient will require a 3 night qualifying inpatient stay at [facility] in order for Medicare to cover SNF. Patient verbalized her understanding and reported that she would do whatever PT recommends. Patient prefers [facility] if she needs to go to SNF.   FL2 complete  and faxed out. CSW will continue to follow and assist as needed.    Employment status:  Retired Insurance information:  Medicare PT Recommendations:  Not assessed at this time Information / Referral to community resources:  Skilled Nursing Facility  Patient/Family's Response to care:  Patient and husband are agreeable to SNF search and prefer [facility] if patient requires SNF.   Patient/Family's Understanding of and Emotional Response to Diagnosis, Current Treatment, and Prognosis:  Patient and husband were pleasant and thanked CSW for assistance.   Emotional Assessment Appearance:  Appears stated age Attitude/Demeanor/Rapport:    Affect (typically observed):  Accepting, Adaptable, Pleasant Orientation:  Oriented to Self, Oriented to Place, Oriented to  Time, Oriented to Situation Alcohol / Substance use:  Not Applicable Psych involvement (Current and /or in the community):  No (Comment)  Discharge Needs  Concerns to be addressed:  Discharge Planning Concerns Readmission within the last 30 days:  No Current discharge risk:  Dependent with Mobility Barriers to Discharge:  Continued Medical Work up   Sample, Cheryl M, LCSW 10/30/2015, 3:13 PM  

## 2015-10-30 NOTE — Consult Note (Signed)
Patient is seen for evaluation of left hip pain. She suffered a fall yesterday while standing in her kitchen she fell says this occasionally does happen to her and afterwards had severe hip pain. She is brought to the emergency room where x-rays showed greater trochanter fracture. Subsequent CT scan shows essentially incomplete or nondisplaced left intratrochanteric hip fracture extending to the lesser trochanter. I discussed these findings with the patient and recommend ORIF to prevent this from displacing.  On exam she is neurovascular intact without shortening or external rotation of the left leg.  X-ray and CT show nondisplaced intertrochanteric hip fracture  Impression is nondisplaced left intertrochanteric hip fracture  Plan is short affixus rod,  risks benefits possible complications discussed

## 2015-10-30 NOTE — Progress Notes (Signed)
[facility] [place] at [facility]   PATIENT NAME: Cheryl Leonard    MRN#:  [id number]  DATE OF BIRTH:  07/10/1945  SUBJECTIVE:  Hospital Day: 0 days Cheryl KellsVeronica Fielder is a 70 y.o. female presenting with Fall and Hip Pain .   Overnight events: No acute overnight events Interval Events: Complains of left-sided hip pain only on movement  REVIEW OF SYSTEMS:  CONSTITUTIONAL: No fever, fatigue or weakness.  EYES: No blurred or double vision.  EARS, NOSE, AND THROAT: No tinnitus or ear pain.  RESPIRATORY: No cough, shortness of breath, wheezing or hemoptysis.  CARDIOVASCULAR: No chest pain, orthopnea, edema.  GASTROINTESTINAL: No nausea, vomiting, diarrhea or abdominal pain.  GENITOURINARY: No dysuria, hematuria.  ENDOCRINE: No polyuria, nocturia,  HEMATOLOGY: No anemia, easy bruising or bleeding SKIN: No rash or lesion. MUSCULOSKELETAL:Positive left hip pain as above otherwise No joint pain or arthritis.   NEUROLOGIC: No tingling, numbness, weakness.  PSYCHIATRY: No anxiety or depression.   DRUG ALLERGIES:  No Known Allergies  VITALS:  Blood pressure (!) 144/56, pulse 75, temperature 98.6 F (37 C), temperature source Oral, resp. rate 19, height 5\' 5"  (1.651 m), weight 72.6 kg (160 lb), SpO2 94 %.  PHYSICAL EXAMINATION:  VITAL SIGNS: Vitals:   10/30/15 0355 10/30/15 0730  BP: (!) 144/60 (!) 144/56  Pulse: 72 75  Resp: 18 19  Temp: 98.5 F (36.9 C) 98.6 F (37 C)   GENERAL:70 y.o.female currently in no acute distress.  HEAD: Normocephalic, atraumatic.  EYES: Pupils equal, round, reactive to light. Extraocular muscles intact. No scleral icterus.  MOUTH: Moist mucosal membrane. Dentition intact. No abscess noted.  EAR, NOSE, THROAT: Clear without exudates. No external lesions.  NECK: Supple. No thyromegaly. No nodules. No JVD.  PULMONARY: Clear to ascultation, without wheeze rails or rhonci. No use of accessory muscles, Good respiratory  effort. good air entry bilaterally CHEST: Nontender to palpation.  CARDIOVASCULAR: S1 and S2. Regular rate and rhythm. No murmurs, rubs, or gallops. No edema. Pedal pulses 2+ bilaterally.  GASTROINTESTINAL: Soft, nontender, nondistended. No masses. Positive bowel sounds. No hepatosplenomegaly.  MUSCULOSKELETAL: No swelling, clubbing, or edema. Range of motion limited in left lower extremity given fracture NEUROLOGIC: Cranial nerves II through XII are intact. No gross focal neurological deficits. Sensation intact. Reflexes intact.  SKIN: No ulceration, lesions, rashes, or cyanosis. Skin warm and dry. Turgor intact.  PSYCHIATRIC: Mood, affect within normal limits. The patient is awake, alert and oriented x 3. Insight, judgment intact.      LABORATORY PANEL:   CBC  Recent Labs Lab 10/29/15 1721  WBC 12.9*  HGB 12.0  HCT 35.0  PLT 338   ------------------------------------------------------------------------------------------------------------------  Chemistries   Recent Labs Lab 10/30/15 0416  NA 137  K 4.2  CL 105  CO2 24  GLUCOSE 152*  BUN 22*  CREATININE 1.20*  CALCIUM 9.0   ------------------------------------------------------------------------------------------------------------------  Cardiac Enzymes No results for input(s): TROPONINI in the last 168 hours. ------------------------------------------------------------------------------------------------------------------  RADIOLOGY:  Dg Chest 1 View  Result Date: 10/29/2015 CLINICAL DATA:  Pt comes into the ED via EMS from home c/o a fall that is causing left hip pain. Denies any referred pain. Denies getting dizzy, hitting head, LOC. Pt has hx of DM, HTN, GERD, former smoker-1 PPD x 20 years EXAM: CHEST 1 VIEW COMPARISON:  None. FINDINGS: Cardiac silhouette is normal in size. No mediastinal or hilar masses. No evidence of adenopathy. Lungs are clear.  No pleural effusion or pneumothorax. Skeletal structures are  intact. IMPRESSION: No active disease. Electronically Signed   By: Amie Portland M.D.   On: 10/29/2015 16:32   Ct Hip Left Wo Contrast  Result Date: 10/29/2015 CLINICAL DATA:  Fall with left hip pain EXAM: CT OF THE LEFT HIP WITHOUT CONTRAST TECHNIQUE: Multidetector CT imaging of the left hip was performed according to the standard protocol. Multiplanar CT image reconstructions were also generated. COMPARISON:  Radiograph 10/29/2015 FINDINGS: Bones/Joint/Cartilage There is a mildly impacted fracture involving the greater trochanter of the proximal left femur. This is contiguous with an obliquely oriented fracture lucency along the anterior aspect of the femoral trochanter. The lucency extends ports the lesser trochanter, however no definite fracture lucency is seen within the lesser trochanter itself. There is no significant angulation. There is a small spiculated focus in the left femoral head which may represent a bone island. Inferior and superior pubic rami appear intact. There is no evidence for acetabular fracture. Left SI joint appears within normal limits. Ligaments Suboptimally assessed by CT. Muscles and Tendons Minimal fatty atrophy of the muscles surrounding the left hip. Soft tissues Small amount of left femoral artery calcification. Limited visualization of intrapelvic contents is unremarkable. IMPRESSION: Mildly impacted fracture involving the greater trochanter of the proximal left femur, fracture lucency extends obliquely along the anterior aspect of the femoral trochanter and approaches the lesser trochanter but there is no definite fracture lucency within the lesser trochanter itself. Normal positioning of the left femoral head. Electronically Signed   By: Jasmine Pang M.D.   On: 10/29/2015 19:30   Dg Hip Unilat W Or Wo Pelvis 2-3 Views Left  Result Date: 10/29/2015 CLINICAL DATA:  Fall.  Left hip pain.  Initial encounter. EXAM: DG HIP (WITH OR WITHOUT PELVIS) 2-3V LEFT COMPARISON:   None. FINDINGS: There is a nondisplaced fracture involving the greater trochanter of the left femur. No definite intertrochanteric component is identified. The left hip is located. Hip joint space widths are preserved. No focal osseous lesion is seen. IMPRESSION: Nondisplaced greater trochanteric fracture of the left femur. Electronically Signed   By: Sebastian Ache M.D.   On: 10/29/2015 16:42    EKG:   Orders placed or performed during the hospital encounter of 10/29/15  . ED EKG  . ED EKG  . EKG 12-Lead  . EKG 12-Lead    ASSESSMENT AND PLAN:   Brittan Butterbaugh is a 70 y.o. female presenting with Fall and Hip Pain . Admitted 10/29/2015 : Day #: 0 days 1. Nondisplaced left intertrochanteric hip fracture: Appreciate Ortho input put patient for ORIF should be considered moderate risk for moderate risk surgery from cardiac standpoint no further intervention or testing required prior to surgery. Okay to continue all current medications, continue with pain control, bowel regimen 2. Type 2 diabetes hold oral agents add sliding scale coverage continue with Lantus 3. Hyperlipidemia unspecified: Statin therapy 4. Essential hypertension by systolic   Will need physical therapy after surgery  All the records are reviewed and case discussed with Care Management/Social Workerr. Management plans discussed with the patient, family and they are in agreement.  CODE STATUS: full TOTAL TIME TAKING CARE OF THIS PATIENT: 33 minutes.   POSSIBLE D/C IN 2-3DAYS, DEPENDING ON CLINICAL CONDITION.   Hower,  Mardi Mainland.D on 10/30/2015 at 11:06 AM  Between 7am to 6pm - Pager - [phone]  After 6pm: House Pager: - [phone]  [facility]  Office  [phone]  CC: Primary care physician; Danella Penton, MD

## 2015-10-30 NOTE — Anesthesia Preprocedure Evaluation (Signed)
Anesthesia Evaluation  Patient identified by MRN, date of birth, ID band Patient awake    Reviewed: Allergy & Precautions, NPO status , Patient's Chart, lab work & pertinent test results  Airway Mallampati: II       Dental  (+) Teeth Intact, Missing   Pulmonary neg pulmonary ROS, former smoker,    breath sounds clear to auscultation       Cardiovascular Exercise Tolerance: Good hypertension, Pt. on medications  Rhythm:Regular Rate:Normal     Neuro/Psych negative psych ROS   GI/Hepatic Neg liver ROS, GERD  Medicated,  Endo/Other  diabetes, Type 2Hypothyroidism   Renal/GU negative Renal ROS     Musculoskeletal   Abdominal Normal abdominal exam  (+)   Peds  Hematology  (+) anemia ,   Anesthesia Other Findings   Reproductive/Obstetrics                             Anesthesia Physical Anesthesia Plan  ASA: II  Anesthesia Plan: Spinal   Post-op Pain Management:    Induction: Intravenous  Airway Management Planned: Natural Airway and Nasal Cannula  Additional Equipment:   Intra-op Plan:   Post-operative Plan:   Informed Consent: I have reviewed the patients History and Physical, chart, labs and discussed the procedure including the risks, benefits and alternatives for the proposed anesthesia with the patient or authorized representative who has indicated his/her understanding and acceptance.     Plan Discussed with: CRNA  Anesthesia Plan Comments:         Anesthesia Quick Evaluation

## 2015-10-30 NOTE — Transfer of Care (Signed)
Immediate Anesthesia Transfer of Care Note  Patient: Cheryl Leonard  Procedure(s) Performed: Procedure(s): INTRAMEDULLARY (IM) NAIL INTERTROCHANTRIC  (Left)  Patient Location: PACU  Anesthesia Type:Spinal  Level of Consciousness: awake, alert  and oriented  Airway & Oxygen Therapy: Patient Spontanous Breathing  Post-op Assessment: Report given to RN and Post -op Vital signs reviewed and stable  Post vital signs: Reviewed and stable  Last Vitals:  Vitals:   10/30/15 0730 10/30/15 1155  BP: (!) 144/56 (!) 145/64  Pulse: 75 83  Resp: 19 16  Temp: 37 C 36.8 C    Last Pain:  Vitals:   10/30/15 1155  TempSrc: Tympanic  PainSc: 10-Worst pain ever         Complications: No apparent anesthesia complications

## 2015-10-30 NOTE — Anesthesia Procedure Notes (Signed)
Spinal  Patient location during procedure: OR Start time: 10/30/2015 12:20 PM End time: 10/30/2015 12:25 PM Staffing Anesthesiologist: Elijio MilesVAN STAVEREN, Kriya Westra F Performed: anesthesiologist  Preanesthetic Checklist Completed: patient identified, site marked, surgical consent, pre-op evaluation, timeout performed, IV checked, risks and benefits discussed and monitors and equipment checked Spinal Block Patient position: right lateral decubitus Prep: Betadine Patient monitoring: heart rate and blood pressure Approach: midline Location: L3-4 Injection technique: single-shot Needle Needle type: Quincke  Needle gauge: 22 G Needle length: 9 cm Needle insertion depth: 6 cm Assessment Sensory level: T8

## 2015-10-30 NOTE — Clinical Social Work Placement (Signed)
   CLINICAL SOCIAL WORK PLACEMENT  NOTE  Date:  10/30/2015  Patient Details  Name: Cheryl Leonard MRN: [id number] Date of Birth: 09/23/1945  Clinical Social Work is seeking post-discharge placement for this patient at the Skilled  Nursing Facility level of care (*CSW will initial, date and re-position this form in  chart as items are completed):  Yes   Patient/family provided with [place] Clinical Social Work Department's list of facilities offering this level of care within the geographic area requested by the patient (or if unable, by the patient's family).  Yes   Patient/family informed of their freedom to choose among providers that offer the needed level of care, that participate in Medicare, Medicaid or managed care program needed by the patient, have an available bed and are willing to accept the patient.  Yes   Patient/family informed of [facility]'s ownership interest in [facility] and [facility], as well as of the fact that they are under no obligation to receive care at these facilities.  PASRR submitted to EDS on 10/30/15     PASRR number received on 10/30/15     Existing PASRR number confirmed on       FL2 transmitted to all facilities in geographic area requested by pt/family on 10/30/15     FL2 transmitted to all facilities within larger geographic area on       Patient informed that his/her managed care company has contracts with or will negotiate with certain facilities, including the following:            Patient/family informed of bed offers received.  Patient chooses bed at       Physician recommends and patient chooses bed at      Patient to be transferred to   on  .  Patient to be transferred to facility by       Patient family notified on   of transfer.  Name of family member notified:        PHYSICIAN       Additional Comment:    _______________________________________________ Kasey Hansell, Darleen CrockerBailey M, LCSW 10/30/2015, 3:12 PM

## 2015-10-31 ENCOUNTER — Inpatient Hospital Stay: Payer: Medicare Other

## 2015-10-31 LAB — CBC
HEMATOCRIT: 34 % — AB (ref 35.0–47.0)
Hemoglobin: 11.6 g/dL — ABNORMAL LOW (ref 12.0–16.0)
MCH: 30.9 pg (ref 26.0–34.0)
MCHC: 34.1 g/dL (ref 32.0–36.0)
MCV: 90.7 fL (ref 80.0–100.0)
Platelets: 285 10*3/uL (ref 150–440)
RBC: 3.75 MIL/uL — AB (ref 3.80–5.20)
RDW: 13.6 % (ref 11.5–14.5)
WBC: 12.4 10*3/uL — AB (ref 3.6–11.0)

## 2015-10-31 LAB — GLUCOSE, CAPILLARY
GLUCOSE-CAPILLARY: 161 mg/dL — AB (ref 65–99)
Glucose-Capillary: 163 mg/dL — ABNORMAL HIGH (ref 65–99)
Glucose-Capillary: 178 mg/dL — ABNORMAL HIGH (ref 65–99)
Glucose-Capillary: 186 mg/dL — ABNORMAL HIGH (ref 65–99)

## 2015-10-31 LAB — BASIC METABOLIC PANEL
ANION GAP: 8 (ref 5–15)
BUN: 12 mg/dL (ref 6–20)
CHLORIDE: 106 mmol/L (ref 101–111)
CO2: 21 mmol/L — AB (ref 22–32)
Calcium: 9 mg/dL (ref 8.9–10.3)
Creatinine, Ser: 1.03 mg/dL — ABNORMAL HIGH (ref 0.44–1.00)
GFR calc Af Amer: >60 mL/min (ref >60)
GFR calc non Af Amer: 54 mL/min — ABNORMAL LOW (ref >60)
GLUCOSE: 142 mg/dL — AB (ref 65–99)
POTASSIUM: 4.1 mmol/L (ref 3.5–5.1)
Sodium: 135 mmol/L (ref 135–145)

## 2015-10-31 LAB — HEMOGLOBIN A1C
HEMOGLOBIN A1C: 5.9 % — AB (ref 4.8–5.6)
Mean Plasma Glucose: 123 mg/dL

## 2015-10-31 IMAGING — DX DG CHEST 1V
1 series · 1 of 1 positions shown · non-contrast
Comparison: [DATE]

CLINICAL DATA: Postoperative fever after ORIF of the left hip to
treat left hip fracture.

EXAM:
CHEST 1 VIEW

[chest ap]
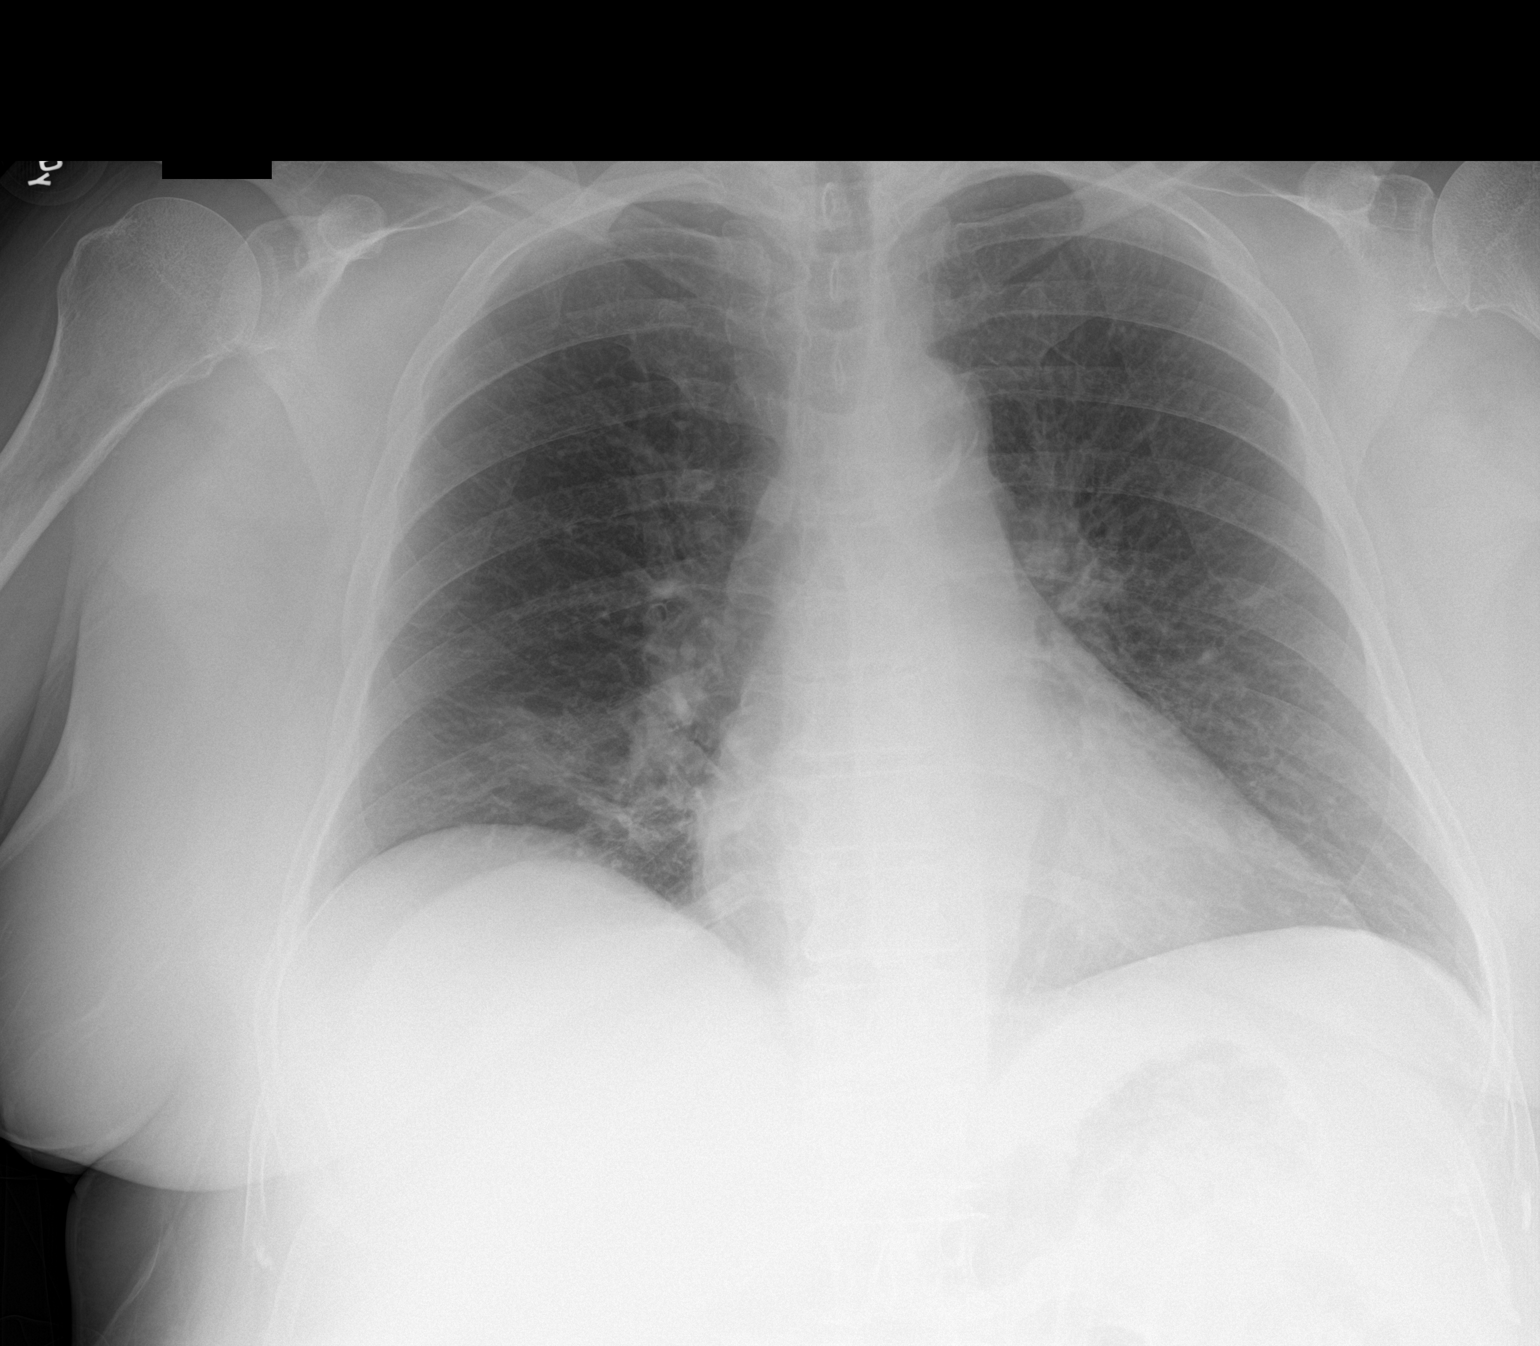

[1 of 1 positions shown; findings below may reference images not displayed]

FINDINGS: New opacity in the right lower lung is consistent with infiltrate or
atelectasis. No edema, pneumothorax or pleural fluid identified. The
heart size and mediastinal contours remain normal.
IMPRESSION: New right lower lung atelectasis versus infiltrate.

## 2015-10-31 MED ORDER — LISINOPRIL 10 MG PO TABS
10.0000 mg | ORAL_TABLET | Freq: Every day | ORAL | Status: DC
  Administered 2015-10-31 – 2015-11-01 (×2): 10 mg via ORAL
  Filled 2015-10-31 (×2): qty 1

## 2015-10-31 NOTE — Progress Notes (Signed)
  Subjective: 1 Day Post-Op Procedure(s) (LRB): INTRAMEDULLARY (IM) NAIL INTERTROCHANTRIC  (Left) Patient reports pain as moderate.   Patient seen in rounds with Dr. Rosita KeaMenz. Patient is well, and has had no acute complaints or problems Plan is to go Skilled nursing facility after hospital stay. Negative for chest pain and shortness of breath Fever: no Gastrointestinal: Negative for nausea and vomiting  Objective: Vital signs in last 24 hours: Temp:  [97.6 F (36.4 C)-101.3 F (38.5 C)] 100.7 F (38.2 C) (10/28 0335) Pulse Rate:  [69-87] 82 (10/28 0335) Resp:  [12-20] 18 (10/28 0335) BP: (105-163)/(54-83) 151/63 (10/28 0335) SpO2:  [90 %-100 %] 94 % (10/28 0335) Weight:  [72.6 kg (160 lb)] 72.6 kg (160 lb) (10/27 1155)  Intake/Output from previous day:  Intake/Output Summary (Last 24 hours) at 10/31/15 0627 Last data filed at 10/31/15 0335  Gross per 24 hour  Intake             2666 ml  Output             3075 ml  Net             -409 ml    Intake/Output this shift: Total I/O In: 1116 [I.V.:1116] Out: 1775 [Urine:1775]  Labs:  Recent Labs  10/29/15 1721 10/31/15 0521  HGB 12.0 11.6*    Recent Labs  10/29/15 1721 10/31/15 0521  WBC 12.9* 12.4*  RBC 3.86 3.75*  HCT 35.0 34.0*  PLT 338 285    Recent Labs  10/29/15 1721 10/30/15 0416  NA 137 137  K 4.6 4.2  CL 104 105  CO2 24 24  BUN 25* 22*  CREATININE 1.29* 1.20*  GLUCOSE 122* 152*  CALCIUM 9.7 9.0    Recent Labs  10/29/15 1721  INR 0.98     EXAM General - Patient is Alert and Oriented Extremity - Sensation intact distally Dorsiflexion/Plantar flexion intact No cellulitis present Compartment soft Dressing/Incision - clean, dry, no drainage Motor Function - intact, moving foot and toes well on exam.   Past Medical History:  Diagnosis Date  . Arthritis   . Cataracts, both eyes   . GERD (gastroesophageal reflux disease)   . Gout   . History of fracture of patella    right knee  .  History of positive PPD    Patient always shows positive  . Hyperlipidemia   . Hypertension   . Hypothyroidism   . Lichen sclerosus 12/30/2013   of vulva  . Metatarsal fracture   . Polyneuropathy (HCC)    numbness and tingling in feet and toes  . Type 2 diabetes mellitus, uncontrolled (HCC)     Assessment/Plan: 1 Day Post-Op Procedure(s) (LRB): INTRAMEDULLARY (IM) NAIL INTERTROCHANTRIC  (Left) Active Problems:   Greater trochanter fracture (HCC)   Hip fracture (HCC)  Estimated body mass index is 26.63 kg/m as calculated from the following:   Height as of this encounter: 5\' 5"  (1.651 m).   Weight as of this encounter: 72.6 kg (160 lb). Advance diet Up with therapy D/C IV fluids  DVT Prophylaxis - Lovenox, Foot Pumps and TED hose Weight-Bearing as tolerated to left leg  Dedra Skeensodd Fannie Alomar, PA-C Orthopaedic Surgery 10/31/2015, 6:27 AM

## 2015-10-31 NOTE — Evaluation (Signed)
Physical Therapy Evaluation Patient Details Name: Cheryl PurserVeronica S Leonard MRN: [id number] DOB: 08/13/1945 Today's Date: 10/31/2015   History of Present Illness  Patient is a 70 year old female, who was admitted s/p fall with left hip fracture. ORIF of L hip.  Clinical Impression  Patient experienced a fall at home, leading to L hip fx. She has been quite independent prior to this admission, and has not used an AD in the past. She is able to bring her LEs off the edge of the bed with cuing, though struggles to tolerate LAQs, SLRs secondary to weakness/pain in her L thigh/hip area. She is able to perform lateral hip abductions in supine and perform sit to stand with min-mod Ax1 and RW. She is not able to tolerate WBing well on LLE at this time and is only able to ambulate to the chair at this time. Given her stark decline in physical mobility, would recommend SNF at this time to address listed deficits.     Follow Up Recommendations SNF    Equipment Recommendations  Rolling walker with 5" wheels    Recommendations for Other Services       Precautions / Restrictions Precautions Precautions: Fall Restrictions Weight Bearing Restrictions: Yes LLE Weight Bearing: Weight bearing as tolerated      Mobility  Bed Mobility Overal bed mobility: Needs Assistance Bed Mobility: Supine to Sit     Supine to sit: Min assist;Mod assist     General bed mobility comments: Patient able to bring LEs off the edge of the bed with PT cuing sequencing. She does however require min-mod A for bringing her trunk off the edge of the bed.   Transfers Overall transfer level: Needs assistance Equipment used: Rolling walker (2 wheeled) Transfers: Sit to/from Stand Sit to Stand: Mod assist         General transfer comment: Patient initially anxious with attempting transfer, requires cuing for use of UEs appropriately through handles.   Ambulation/Gait Ambulation/Gait assistance: Mod assist Ambulation  Distance (Feet): 3 Feet Assistive device: Rolling walker (2 wheeled) Gait Pattern/deviations: Step-to pattern;Decreased step length - right;Decreased step length - left;Trunk flexed;Decreased weight shift to left   Gait velocity interpretation: <1.8 ft/sec, indicative of risk for recurrent falls General Gait Details: Patient has difficulty with weightbearing on LLE secondary to pain, she opts to transfer only to chair secondary to fatigue from ambulation/weightbearing.   Stairs            Wheelchair Mobility    Modified Rankin (Stroke Patients Only)       Balance Overall balance assessment: Needs assistance Sitting-balance support: No upper extremity supported Sitting balance-Leahy Scale: Good     Standing balance support: Bilateral upper extremity supported Standing balance-Leahy Scale: Fair                               Pertinent Vitals/Pain Pain Assessment: Faces Pain Score: 7  Faces Pain Scale: Hurts even more Pain Location: L hip with ambulation  Pain Descriptors / Indicators: Operative site guarding Pain Intervention(s): Limited activity within patient's tolerance;Monitored during session;Patient requesting pain meds-RN notified    Home Living Family/patient expects to be discharged to:: Private residence Living Arrangements: Spouse/significant other Available Help at Discharge: Family Type of Home: House Home Access: Stairs to enter Entrance Stairs-Rails: Doctor, general practiceight;Left Entrance Stairs-Number of Steps: 4-5 Home Layout: Able to live on main level with bedroom/bathroom        Prior Function Level  of Independence: Independent               Hand Dominance   Dominant Hand: Right    Extremity/Trunk Assessment   Upper Extremity Assessment: Overall WFL for tasks assessed           Lower Extremity Assessment: LLE deficits/detail   LLE Deficits / Details: Requires assistance with LAQs and SLRs.      Communication   Communication: No  difficulties  Cognition Arousal/Alertness: Awake/alert Behavior During Therapy: WFL for tasks assessed/performed Overall Cognitive Status: Within Functional Limits for tasks assessed                      General Comments      Exercises Total Joint Exercises Ankle Circles/Pumps: AROM;Both;10 reps Heel Slides: AROM;Right;AAROM;Left;10 reps Hip ABduction/ADduction: AROM;Both;10 reps Straight Leg Raises: AROM;Right;AAROM;Left;10 reps Long Arc Quad: AROM;Right;AAROM;Left;10 reps Marching in Standing: AROM;Both;10 reps;AAROM;Seated   Assessment/Plan    PT Assessment Patient needs continued PT services  PT Problem List Decreased strength;Decreased mobility;Decreased activity tolerance;Decreased balance;Decreased knowledge of precautions          PT Treatment Interventions DME instruction;Therapeutic activities;Therapeutic exercise;Gait training;Stair training;Balance training;Manual techniques;Patient/family education    PT Goals (Current goals can be found in the Care Plan section)  Acute Rehab PT Goals Patient Stated Goal: Go home safely PT Goal Formulation: With patient/family Time For Goal Achievement: 11/14/15 Potential to Achieve Goals: Good    Frequency BID   Barriers to discharge        Co-evaluation               End of Session Equipment Utilized During Treatment: Gait belt Activity Tolerance: Patient tolerated treatment well;Patient limited by pain Patient left: in chair;with chair alarm set;with call bell/phone within reach;with family/visitor present Nurse Communication: Mobility status;Patient requests pain meds         Time: [id number] PT Time Calculation (min) (ACUTE ONLY): 23 min   Charges:   PT Evaluation $PT Eval Moderate Complexity: 1 Procedure PT Treatments $Therapeutic Exercise: 8-22 mins   PT G Codes:       Kerin RansomPatrick A Meadow Abramo, PT, DPT    10/31/2015, 4:13 PM

## 2015-10-31 NOTE — Progress Notes (Signed)
[facility] - [place] [facility]   PATIENT NAME: Cheryl Leonard    MR#:  [id number]  DATE OF BIRTH:  05/26/1945  SUBJECTIVE:   Patient had fever this morning. She denies cough or shortness of breath.  REVIEW OF SYSTEMS:    Review of Systems  Constitutional: Negative.  Negative for chills, fever and malaise/fatigue.  HENT: Negative.  Negative for ear discharge, ear pain, hearing loss, nosebleeds and sore throat.   Eyes: Negative.  Negative for blurred vision and pain.  Respiratory: Negative.  Negative for cough, hemoptysis, shortness of breath and wheezing.   Cardiovascular: Negative.  Negative for chest pain, palpitations and leg swelling.  Gastrointestinal: Negative.  Negative for abdominal pain, blood in stool, diarrhea, nausea and vomiting.  Genitourinary: Negative.  Negative for dysuria.  Musculoskeletal: Positive for joint pain. Negative for back pain.  Skin: Negative.   Neurological: Negative for dizziness, tremors, speech change, focal weakness, seizures and headaches.  Endo/Heme/Allergies: Negative.  Does not bruise/bleed easily.  Psychiatric/Behavioral: Negative.  Negative for depression, hallucinations and suicidal ideas.    Tolerating Diet: yes      DRUG ALLERGIES:  No Known Allergies  VITALS:  Blood pressure (!) 169/67, pulse 70, temperature 98.8 F (37.1 C), temperature source Oral, resp. rate 18, height 5\' 5"  (1.651 m), weight 72.6 kg (160 lb), SpO2 96 %.  PHYSICAL EXAMINATION:   Physical Exam    LABORATORY PANEL:   CBC  Recent Labs Lab 10/31/15 0521  WBC 12.4*  HGB 11.6*  HCT 34.0*  PLT 285   ------------------------------------------------------------------------------------------------------------------  Chemistries   Recent Labs Lab 10/31/15 0521  NA 135  K 4.1  CL 106  CO2 21*  GLUCOSE 142*  BUN 12  CREATININE 1.03*  CALCIUM 9.0    ------------------------------------------------------------------------------------------------------------------  Cardiac Enzymes No results for input(s): TROPONINI in the last 168 hours. ------------------------------------------------------------------------------------------------------------------  RADIOLOGY:  Dg Chest 1 View  Result Date: 10/31/2015 CLINICAL DATA:  Postoperative fever after ORIF of the left hip to treat left hip fracture. EXAM: CHEST 1 VIEW COMPARISON:  10/29/2015 FINDINGS: New opacity in the right lower lung is consistent with infiltrate or atelectasis. No edema, pneumothorax or pleural fluid identified. The heart size and mediastinal contours remain normal. IMPRESSION: New right lower lung atelectasis versus infiltrate. Electronically Signed   By: Irish LackGlenn  Yamagata M.D.   On: 10/31/2015 09:40   Dg Chest 1 View  Result Date: 10/29/2015 CLINICAL DATA:  Pt comes into the ED via EMS from home c/o a fall that is causing left hip pain. Denies any referred pain. Denies getting dizzy, hitting head, LOC. Pt has hx of DM, HTN, GERD, former smoker-1 PPD x 20 years EXAM: CHEST 1 VIEW COMPARISON:  None. FINDINGS: Cardiac silhouette is normal in size. No mediastinal or hilar masses. No evidence of adenopathy. Lungs are clear.  No pleural effusion or pneumothorax. Skeletal structures are intact. IMPRESSION: No active disease. Electronically Signed   By: Amie Portlandavid  Ormond M.D.   On: 10/29/2015 16:32   Ct Hip Left Wo Contrast  Result Date: 10/29/2015 CLINICAL DATA:  Fall with left hip pain EXAM: CT OF THE LEFT HIP WITHOUT CONTRAST TECHNIQUE: Multidetector CT imaging of the left hip was performed according to the standard protocol. Multiplanar CT image reconstructions were also generated. COMPARISON:  Radiograph 10/29/2015 FINDINGS: Bones/Joint/Cartilage There is a mildly impacted fracture involving the greater trochanter of the proximal left femur. This is contiguous with an obliquely  oriented fracture lucency along the anterior aspect of the femoral  trochanter. The lucency extends ports the lesser trochanter, however no definite fracture lucency is seen within the lesser trochanter itself. There is no significant angulation. There is a small spiculated focus in the left femoral head which may represent a bone island. Inferior and superior pubic rami appear intact. There is no evidence for acetabular fracture. Left SI joint appears within normal limits. Ligaments Suboptimally assessed by CT. Muscles and Tendons Minimal fatty atrophy of the muscles surrounding the left hip. Soft tissues Small amount of left femoral artery calcification. Limited visualization of intrapelvic contents is unremarkable. IMPRESSION: Mildly impacted fracture involving the greater trochanter of the proximal left femur, fracture lucency extends obliquely along the anterior aspect of the femoral trochanter and approaches the lesser trochanter but there is no definite fracture lucency within the lesser trochanter itself. Normal positioning of the left femoral head. Electronically Signed   By: Jasmine PangKim  Fujinaga M.D.   On: 10/29/2015 19:30   Dg Hip Operative Unilat W Or W/o Pelvis Left  Result Date: 10/30/2015 CLINICAL DATA:  Postop left femur fracture EXAM: OPERATIVE left HIP (WITH PELVIS IF PERFORMED) 3 VIEWS TECHNIQUE: Fluoroscopic spot image(s) were submitted for interpretation post-operatively. COMPARISON:  10/29/2015 FINDINGS: Three views of the left hip submitted. The patient is status post intraoperative repair of left femur fracture. There is intra medullary rod proximal left femur. Metallic locking pin noted left femoral neck. There is anatomic alignment. IMPRESSION: Status post intraoperative repair of proximal left femur fracture. Intra medullary rod and locking pin noted in proximal left femur with anatomic alignment. Fluoroscopy time 55 seconds.  Please see the operative report. Electronically Signed   By: Natasha MeadLiviu   Pop M.D.   On: 10/30/2015 13:23   Dg Hip Unilat W Or Wo Pelvis 2-3 Views Left  Result Date: 10/29/2015 CLINICAL DATA:  Fall.  Left hip pain.  Initial encounter. EXAM: DG HIP (WITH OR WITHOUT PELVIS) 2-3V LEFT COMPARISON:  None. FINDINGS: There is a nondisplaced fracture involving the greater trochanter of the left femur. No definite intertrochanteric component is identified. The left hip is located. Hip joint space widths are preserved. No focal osseous lesion is seen. IMPRESSION: Nondisplaced greater trochanteric fracture of the left femur. Electronically Signed   By: Sebastian AcheAllen  Grady M.D.   On: 10/29/2015 16:42     ASSESSMENT AND PLAN:     70 y.o. female with a known history of Hypertension, hyperlipidemia, diabetes mellitus-insulin-dependent, degenerative arthritis presents to the hospital secondary to fall and left hip pain.  1 Left greater trochanteric fracture: Patient is postoperative day #1. DVT prophylaxis per orthopedic surgery. Fever likely due to atelectasis. If patient has another fever then will add antibiotics for pneumonia. Follow hemoglobin   2.  diabetes mellitus: Continue Lantus and sliding scale insulin.  3 GERD: Continue PPI  4 hyperlipidemia-continue gemfibrozil  5 hypertension-continue Bystolic and lisinopril   6. Hypothyroid: Continue Synthroid   Management plans discussed with the patient and she is in agreement.  CODE STATUS: full  TOTAL TIME TAKING CARE OF THIS PATIENT: 30 minutes.   Rounded with nursing staff  POSSIBLE D/C 2 days, DEPENDING ON CLINICAL CONDITION.   Jacqualin Shirkey M.D on 10/31/2015 at 11:19 AM  Between 7am to 6pm - Pager - [phone] After 6pm go to [email or web address] - password [email or web address]  [facility]  Office  [phone]  CC: Primary care physician; Danella PentonMark F Miller, MD  Note: This dictation was prepared with Dragon dictation along with smaller phrase technology. Any transcriptional errors that result from  this process are unintentional.

## 2015-10-31 NOTE — Progress Notes (Signed)
Physical Therapy Treatment Patient Details Name: Cheryl PurserVeronica S Leonard MRN: [id number] DOB: 07/09/1945 Today's Date: 10/31/2015    History of Present Illness Patient is a 70 year old female, who was admitted s/p fall with left hip fracture. ORIF of L hip.  Patient tolerates weightbearing on LLE much better this afternoon and is able to ambulate ~ 30 feet. She initially ambulates 20' with cuing for sequencing and use of UEs through RW. After rest break she is able to ambulate additional ~10 feet. She continues to struggle with sit to stand transition, though PT provides 2 pillows underneath her hips in sitting to elevate her position. She is progressing nicely towards her mobility goals.   PT Comments      Follow Up Recommendations  SNF     Equipment Recommendations  Rolling walker with 5" wheels    Recommendations for Other Services       Precautions / Restrictions Precautions Precautions: Fall Restrictions Weight Bearing Restrictions: Yes LLE Weight Bearing: Weight bearing as tolerated    Mobility  Bed Mobility Overal bed mobility: Needs Assistance Bed Mobility: Supine to Sit     Supine to sit: Min assist;Mod assist     General bed mobility comments: Patient in recliner to begin session.   Transfers Overall transfer level: Needs assistance Equipment used: Rolling walker (2 wheeled) Transfers: Sit to/from Stand Sit to Stand: Mod assist         General transfer comment: Patient requires significant assistance from therapist to complete sit to stand transfer secondary to pain/weakness in LLE.   Ambulation/Gait Ambulation/Gait assistance: Min guard;Min assist Ambulation Distance (Feet): 25 Feet Assistive device: Rolling walker (2 wheeled) Gait Pattern/deviations: Step-to pattern;Decreased step length - right;Decreased step length - left;Trunk flexed;Decreased weight shift to left   Gait velocity interpretation: <1.8 ft/sec, indicative of risk for recurrent  falls General Gait Details: Patient tolerates weightbearing through LLE much better in this session. She requires cuing to maintain sequence of weightbearing and using RW. No buckling otr loss of balance noted.    Stairs            Wheelchair Mobility    Modified Rankin (Stroke Patients Only)       Balance Overall balance assessment: Needs assistance Sitting-balance support: No upper extremity supported Sitting balance-Leahy Scale: Good     Standing balance support: Bilateral upper extremity supported Standing balance-Leahy Scale: Fair                      Cognition Arousal/Alertness: Awake/alert Behavior During Therapy: WFL for tasks assessed/performed Overall Cognitive Status: Within Functional Limits for tasks assessed                      Exercises Total Joint Exercises Ankle Circles/Pumps: AROM;Both;10 reps Heel Slides: AROM;Right;AAROM;Left;10 reps Hip ABduction/ADduction: AROM;Both;10 reps Straight Leg Raises: AROM;Right;AAROM;Left;10 reps Long Arc Quad: AROM;Right;AAROM;Left;15 reps Marching in Standing: AROM;Right;AAROM;Left;15 reps;Seated    General Comments        Pertinent Vitals/Pain Pain Assessment: Faces Faces Pain Scale: Hurts even more Pain Location: L hip after ambulation  Pain Descriptors / Indicators: Aching;Operative site guarding Pain Intervention(s): Limited activity within patient's tolerance;Monitored during session;Patient requesting pain meds-RN notified    Home Living Family/patient expects to be discharged to:: Private residence Living Arrangements: Spouse/significant other Available Help at Discharge: Family Type of Home: House Home Access: Stairs to enter Entrance Stairs-Rails: Right;Left Home Layout: Able to live on main level with bedroom/bathroom  Prior Function Level of Independence: Independent          PT Goals (current goals can now be found in the care plan section) Acute Rehab PT  Goals Patient Stated Goal: Go home safely PT Goal Formulation: With patient/family Time For Goal Achievement: 11/14/15 Potential to Achieve Goals: Good Progress towards PT goals: Progressing toward goals    Frequency    BID      PT Plan Current plan remains appropriate    Co-evaluation             End of Session Equipment Utilized During Treatment: Gait belt Activity Tolerance: Patient tolerated treatment well;Patient limited by fatigue Patient left: in chair;with chair alarm set;with call bell/phone within reach;with family/visitor present     Time: [id number] PT Time Calculation (min) (ACUTE ONLY): 24 min  Charges:  $Gait Training: 8-22 mins $Therapeutic Exercise: 8-22 mins                     G Codes:      Kerin RansomPatrick A Haley Fuerstenberg, PT, DPT    10/31/2015, 4:40 PM

## 2015-10-31 NOTE — Anesthesia Postprocedure Evaluation (Signed)
Anesthesia Post Note  Patient: Lollie SailsVeronica S Nazaryan  Procedure(s) Performed: Procedure(s) (LRB): INTRAMEDULLARY (IM) NAIL INTERTROCHANTRIC  (Left)  Patient location during evaluation: Nursing Unit Anesthesia Type: Spinal Level of consciousness: awake, awake and alert and oriented Respiratory status: spontaneous breathing, nonlabored ventilation and respiratory function stable Cardiovascular status: blood pressure returned to baseline and stable Postop Assessment: no headache Anesthetic complications: no    Last Vitals:  Vitals:   10/31/15 0748 10/31/15 1201  BP: (!) 169/67 (!) 146/69  Pulse: 70 75  Resp: 18 18  Temp: 37.1 C 37.1 C    Last Pain:  Vitals:   10/31/15 1212  TempSrc:   PainSc: 4                  Travelle Mcclimans,  Margit BandaJohn M

## 2015-10-31 NOTE — Evaluation (Signed)
Occupational Therapy Evaluation Patient Details Name: Cheryl PurserVeronica S Hilley MRN: [id number] DOB: 12/04/1945 Today's Date: 10/31/2015    History of Present Illness Patient is a 70 year old female, who was admitted s/p fall with left hip fracture. ORIF of L hip.   Clinical Impression   Patient was sitting up in recliner, with many family members present when OT arrived. All family, except for husband, left for evaluation. Patient sustained a left hip fracture secondary to a fall in home. States she was coming around a corner when her leg gave out. Stated she also hit her left shoulder when she fell. Denies more than some soreness in left shoulder. No ROM or strength impairments noted compared to RUE. Patient states she has had 2 other fall in the past year. On New Year's Eve, she fell out of the attic (8 feet), and landed on her right hip. Denies any injury. In the spring, she fell coming out of the shower, and broke her foot. Patient is pleasant, and participated in all tasks. Patient noted to need encouragement, cues for positioning BLE, and MIN A for sit to stand from recliner. Patient noted to favor LLE during standing, and needed physical and verbal cues for maintaining balance at midline due to a right lean. Patient required MIN Guard to MIN A for maintaining standing balance during all ADL tasks. Patient education on safety, AE use, and compensatory techniques for LB dressing provided. Patient also educated on assistance for any upright tasks at this time. Patient verbalized understanding of OT education.    Follow Up Recommendations  No OT follow up    Equipment Recommendations       Recommendations for Other Services PT consult     Precautions / Restrictions Precautions Precautions: Fall Restrictions Weight Bearing Restrictions: Yes LLE Weight Bearing: Weight bearing as tolerated      Mobility Bed Mobility                  Transfers Overall transfer level: Needs  assistance Equipment used: Rolling walker (2 wheeled) Transfers: Sit to/from Stand Sit to Stand: Min assist              Balance Overall balance assessment: Needs assistance         Standing balance support: Single extremity supported                                ADL Overall ADL's : Needs assistance/impaired                     Lower Body Dressing: Moderate assistance   Toilet Transfer: Minimal assistance   Toileting- Clothing Manipulation and Hygiene: Moderate assistance       Functional mobility during ADLs: Minimal assistance General ADL Comments: Patient is favoring LLE, and has increased lean to the right. Needs assist and cues for maintaining upright balance during ADL tasks.     Vision     Perception     Praxis      Pertinent Vitals/Pain Pain Assessment: 0-10 Pain Score: 7  Pain Location: left hip Pain Descriptors / Indicators: Operative site guarding;Sore Pain Intervention(s): Limited activity within patient's tolerance;Repositioned     Hand Dominance Right   Extremity/Trunk Assessment Upper Extremity Assessment Upper Extremity Assessment: Overall WFL for tasks assessed   Lower Extremity Assessment Lower Extremity Assessment: Defer to PT evaluation       Communication Communication Communication: No  difficulties   Cognition Arousal/Alertness: Awake/alert Behavior During Therapy: WFL for tasks assessed/performed Overall Cognitive Status: Within Functional Limits for tasks assessed                     General Comments       Exercises       Shoulder Instructions      Home Living Family/patient expects to be discharged to:: Private residence Living Arrangements: Spouse/significant other Available Help at Discharge: Family Type of Home: House Home Access: Stairs to enter Secretary/administratorntrance Stairs-Number of Steps: 4-5 Entrance Stairs-Rails: Right;Left Home Layout: Able to live on main level with  bedroom/bathroom     Bathroom Shower/Tub: Walk-in shower                    Prior Functioning/Environment Level of Independence: Independent                 OT Problem List: Impaired balance (sitting and/or standing);Decreased knowledge of use of DME or AE   OT Treatment/Interventions: Self-care/ADL training;DME and/or AE instruction;Therapeutic activities;Balance training;Patient/family education    OT Goals(Current goals can be found in the care plan section) Acute Rehab OT Goals Patient Stated Goal: Go home safely OT Goal Formulation: With patient/family Time For Goal Achievement: 11/14/15 Potential to Achieve Goals: Good  OT Frequency: Min 2X/week   Barriers to D/C: Inaccessible home environment          Co-evaluation              End of Session Equipment Utilized During Treatment: Gait belt;Rolling walker;Oxygen  Activity Tolerance: Patient tolerated treatment well Patient left: in chair   Time: 1120-1205 OT Time Calculation (min): 45 min Charges:  OT General Charges $OT Visit: 1 Procedure OT Evaluation $OT Eval Low Complexity: 1 Procedure OT Treatments $Self Care/Home Management : 23-37 mins G-Codes:    Luca Dyar L 10/31/2015, 12:40 PM Kirstie PeriWendy Ikran Patman, OTR/L

## 2015-11-01 LAB — CBC
HEMATOCRIT: 31.7 % — AB (ref 35.0–47.0)
Hemoglobin: 11.4 g/dL — ABNORMAL LOW (ref 12.0–16.0)
MCH: 32.2 pg (ref 26.0–34.0)
MCHC: 35.9 g/dL (ref 32.0–36.0)
MCV: 89.6 fL (ref 80.0–100.0)
Platelets: 287 10*3/uL (ref 150–440)
RBC: 3.54 MIL/uL — ABNORMAL LOW (ref 3.80–5.20)
RDW: 13.7 % (ref 11.5–14.5)
WBC: 10.5 10*3/uL (ref 3.6–11.0)

## 2015-11-01 LAB — GLUCOSE, CAPILLARY: GLUCOSE-CAPILLARY: 96 mg/dL (ref 65–99)

## 2015-11-01 MED ORDER — LEVOFLOXACIN 500 MG PO TABS
500.0000 mg | ORAL_TABLET | Freq: Every day | ORAL | Status: DC
  Administered 2015-11-01: 500 mg via ORAL
  Filled 2015-11-01: qty 1

## 2015-11-01 MED ORDER — ENOXAPARIN SODIUM 40 MG/0.4ML ~~LOC~~ SOLN: 40.0000 mg | SUBCUTANEOUS | 0 refills | Status: DC

## 2015-11-01 MED ORDER — HYDROCODONE-ACETAMINOPHEN 5-325 MG PO TABS: 1.0000 | ORAL_TABLET | ORAL | 0 refills | Status: DC | PRN

## 2015-11-01 MED ORDER — LEVOFLOXACIN 500 MG PO TABS: 500.0000 mg | ORAL_TABLET | Freq: Every day | ORAL | 0 refills | Status: AC

## 2015-11-01 NOTE — Discharge Summary (Signed)
[facility] [place] [facility]   PATIENT NAME: Cheryl Leonard    MR#:  [id number]  DATE OF BIRTH:  May 07, 1945  DATE OF ADMISSION:  10/29/2015 ADMITTING PHYSICIAN: Enid Baas, MD  DATE OF DISCHARGE: 11/01/2015  PRIMARY CARE PHYSICIAN: Danella Penton, MD    ADMISSION DIAGNOSIS:  Hip fracture (HCC) [S72.009A] Closed fracture of left hip, initial encounter (HCC) [S72.002A] Fall, initial encounter [W19.XXXA]  DISCHARGE DIAGNOSIS:  Active Problems:   Greater trochanter fracture (HCC)   Hip fracture (HCC)   SECONDARY DIAGNOSIS:   Past Medical History:  Diagnosis Date  . Arthritis   . Cataracts, both eyes   . GERD (gastroesophageal reflux disease)   . Gout   . History of fracture of patella    right knee  . History of positive PPD    Patient always shows positive  . Hyperlipidemia   . Hypertension   . Hypothyroidism   . Lichen sclerosus 12/30/2013   of vulva  . Metatarsal fracture   . Polyneuropathy (HCC)    numbness and tingling in feet and toes  . Type 2 diabetes mellitus, uncontrolled (HCC)     HOSPITAL COURSE:  70 y.o. femalewith a known history of Hypertension, hyperlipidemia, diabetes mellitus-insulin-dependent, degenerative arthritis presents to the hospital secondary to fall and left hip pain.  1 Left greater trochanteric fracture: Patient is postoperative day #2.  Physical therapy has recommended skilled nursing facility at discharge. DVT prophylaxis per orthopedic surgery with Lovenox.   2.  diabetes mellitus: Continue Lantus  3 GERD: Continue PPI  4 hyperlipidemia-continue gemfibrozil  5 hypertension-continue Bystolic and lisinopril   6. Hypothyroid: Continue Synthroid  7. Fever: This is most likely due to atelectasis however pneumonia cannot completely be ruled out. She is started on Levaquin for 5 more days of treatment.  DISCHARGE CONDITIONS AND DIET:   Stable to be discharged on diabetic diet  CONSULTS  OBTAINED:  Treatment Team:  Kennedy Bucker, MD  DRUG ALLERGIES:  No Known Allergies  DISCHARGE MEDICATIONS:   Current Discharge Medication List    START taking these medications   Details  enoxaparin (LOVENOX) 40 MG/0.4ML injection Inject 0.4 mLs (40 mg total) into the skin daily. Qty: 14 Syringe, Refills: 0    HYDROcodone-acetaminophen (NORCO/VICODIN) 5-325 MG tablet Take 1-2 tablets by mouth every 4 (four) hours as needed for moderate pain. Qty: 30 tablet, Refills: 0    levofloxacin (LEVAQUIN) 500 MG tablet Take 1 tablet (500 mg total) by mouth daily at 6 (six) AM. Qty: 5 tablet, Refills: 0      CONTINUE these medications which have NOT CHANGED   Details  ALPRAZolam (XANAX) 0.25 MG tablet Take 1 tablet by mouth as needed. For sleep    aspirin EC 81 MG tablet Take 1 tablet by mouth daily.    atorvastatin (LIPITOR) 20 MG tablet Take 20 mg by mouth at bedtime. Refills: 3    buPROPion (WELLBUTRIN XL) 300 MG 24 hr tablet Take 1 tablet by mouth daily. Refills: 11    BYSTOLIC 5 MG tablet Take 5 mg by mouth daily. Refills: 2    DULoxetine (CYMBALTA) 60 MG capsule Take 1 capsule by mouth 2 (two) times daily.    gemfibrozil (LOPID) 600 MG tablet Take 600 mg by mouth 2 (two) times daily. Refills: 3    Iron-Vitamin C (VITRON-C) 65-125 MG TABS Take 1 tablet by mouth 2 (two) times daily.     JANUMET 50-1000 MG per tablet Take 1 tablet by mouth  2 (two) times daily with a meal. Refills: 3    levothyroxine (SYNTHROID, LEVOTHROID) 100 MCG tablet Take 100 mcg by mouth daily. Refills: 3    lisinopril (PRINIVIL,ZESTRIL) 10 MG tablet Take 1 tablet by mouth daily.    pantoprazole (PROTONIX) 40 MG tablet Take 40 mg by mouth 2 (two) times daily.   Associated Diagnoses: Iron deficiency anemia    ranitidine (ZANTAC) 300 MG capsule Take 300 mg by mouth at bedtime. Refills: 1    TOUJEO SOLOSTAR 300 UNIT/ML SOPN Inject 30 Units into the skin at bedtime. Refills: 7    zolpidem (AMBIEN)  10 MG tablet Take 10 mg by mouth at bedtime. Refills: 5              Today   CHIEF COMPLAINT:   Patient doing well this morning. Denies cough. Pain has improved. She had a large bowel movement yesterday   VITAL SIGNS:  Blood pressure (!) 138/40, pulse 74, temperature (!) 100.7 F (38.2 C), temperature source Oral, resp. rate 18, height 5\' 5"  (1.651 m), weight 72.6 kg (160 lb), SpO2 94 %.   REVIEW OF SYSTEMS:  Review of Systems  Constitutional: Negative.  Negative for chills, fever and malaise/fatigue.  HENT: Negative.  Negative for ear discharge, ear pain, hearing loss, nosebleeds and sore throat.   Eyes: Negative.  Negative for blurred vision and pain.  Respiratory: Negative.  Negative for cough, hemoptysis, shortness of breath and wheezing.   Cardiovascular: Negative.  Negative for chest pain, palpitations and leg swelling.  Gastrointestinal: Negative.  Negative for abdominal pain, blood in stool, diarrhea, nausea and vomiting.  Genitourinary: Negative.  Negative for dysuria.  Musculoskeletal: Positive for joint pain. Negative for back pain.  Skin: Negative.   Neurological: Negative for dizziness, tremors, speech change, focal weakness, seizures and headaches.  Endo/Heme/Allergies: Negative.  Does not bruise/bleed easily.  Psychiatric/Behavioral: Negative.  Negative for depression, hallucinations and suicidal ideas.     PHYSICAL EXAMINATION:  GENERAL:  70 y.o.-year-old patient lying in the bed with no acute distress.  NECK:  Supple, no jugular venous distention. No thyroid enlargement, no tenderness.  LUNGS: Normal breath sounds bilaterally, no wheezing, rales,rhonchi  No use of accessory muscles of respiration.  CARDIOVASCULAR: S1, S2 normal. No murmurs, rubs, or gallops.  ABDOMEN: Soft, non-tender, non-distended. Bowel sounds present. No organomegaly or mass.  EXTREMITIES: No pedal edema, cyanosis, or clubbing.  PSYCHIATRIC: The patient is alert and oriented x 3.   SKIN: No obvious rash, lesion, or ulcer.  Dressing placed on heparin DATA REVIEW:   CBC  Recent Labs Lab 11/01/15 0336  WBC 10.5  HGB 11.4*  HCT 31.7*  PLT 287    Chemistries   Recent Labs Lab 10/31/15 0521  NA 135  K 4.1  CL 106  CO2 21*  GLUCOSE 142*  BUN 12  CREATININE 1.03*  CALCIUM 9.0    Cardiac Enzymes No results for input(s): TROPONINI in the last 168 hours.  Microbiology Results  @[email or web address] @  RADIOLOGY:  Dg Chest 1 View  Result Date: 10/31/2015 CLINICAL DATA:  Postoperative fever after ORIF of the left hip to treat left hip fracture. EXAM: CHEST 1 VIEW COMPARISON:  10/29/2015 FINDINGS: New opacity in the right lower lung is consistent with infiltrate or atelectasis. No edema, pneumothorax or pleural fluid identified. The heart size and mediastinal contours remain normal. IMPRESSION: New right lower lung atelectasis versus infiltrate. Electronically Signed   By: Irish LackGlenn  Yamagata M.D.   On: 10/31/2015 09:40   Dg  Hip Operative Unilat W Or W/o Pelvis Left  Result Date: 10/30/2015 CLINICAL DATA:  Postop left femur fracture EXAM: OPERATIVE left HIP (WITH PELVIS IF PERFORMED) 3 VIEWS TECHNIQUE: Fluoroscopic spot image(s) were submitted for interpretation post-operatively. COMPARISON:  10/29/2015 FINDINGS: Three views of the left hip submitted. The patient is status post intraoperative repair of left femur fracture. There is intra medullary rod proximal left femur. Metallic locking pin noted left femoral neck. There is anatomic alignment. IMPRESSION: Status post intraoperative repair of proximal left femur fracture. Intra medullary rod and locking pin noted in proximal left femur with anatomic alignment. Fluoroscopy time 55 seconds.  Please see the operative report. Electronically Signed   By: Natasha MeadLiviu  Pop M.D.   On: 10/30/2015 13:23      Management plans discussed with the patient and she is in agreement. Stable for discharge snf  Patient should follow up with  ortho  CODE STATUS:     Code Status Orders        Start     Ordered   10/29/15 2117  Full code  Continuous     10/29/15 2116    Code Status History    Date Active Date Inactive Code Status Order ID Comments User Context   This patient has a current code status but no historical code status.    Advance Directive Documentation   Flowsheet Row Most Recent Value  Type of Advance Directive  Healthcare Power of Attorney, Living will  Pre-existing out of facility DNR order (yellow form or pink MOST form)  No data  "MOST" Form in Place?  No data      TOTAL TIME TAKING CARE OF THIS PATIENT: 36 minutes.    Note: This dictation was prepared with Dragon dictation along with smaller phrase technology. Any transcriptional errors that result from this process are unintentional.  Mila Pair M.D on 11/01/2015 at 8:15 AM  Between 7am to 6pm - Pager - [phone] After 6pm go to [email or web address] - Social research officer, governmentpassword [email or web address] [email or web address]  [facility]  Office  [phone]  CC: Primary care physician; Danella PentonMark F Miller, MD

## 2015-11-01 NOTE — Progress Notes (Signed)
  Subjective: 2 Days Post-Op Procedure(s) (LRB): INTRAMEDULLARY (IM) NAIL INTERTROCHANTRIC  (Left) Patient reports pain as .   Patient seen in rounds with Dr. Rosita KeaMenz. Patient is well, and has had no acute complaints or problems Plan is to go Skilled nursing facility after hospital stay. Possibly going today or Monday. Negative for chest pain and shortness of breath Fever: no Gastrointestinal: Negative for nausea and vomiting  Objective: Vital signs in last 24 hours: Temp:  [98.8 F (37.1 C)-100.4 F (38 C)] 100 F (37.8 C) (10/29 0316) Pulse Rate:  [70-81] 81 (10/29 0316) Resp:  [18] 18 (10/29 0316) BP: (123-169)/(56-84) 148/56 (10/29 0316) SpO2:  [94 %-99 %] 94 % (10/29 0316)  Intake/Output from previous day:  Intake/Output Summary (Last 24 hours) at 11/01/15 0633 Last data filed at 11/01/15 0500  Gross per 24 hour  Intake             1910 ml  Output              250 ml  Net             1660 ml    Intake/Output this shift: Total I/O In: 480 [P.O.:480] Out: 250 [Urine:250]  Labs:  Recent Labs  10/29/15 1721 10/31/15 0521 11/01/15 0336  HGB 12.0 11.6* 11.4*    Recent Labs  10/31/15 0521 11/01/15 0336  WBC 12.4* 10.5  RBC 3.75* 3.54*  HCT 34.0* 31.7*  PLT 285 287    Recent Labs  10/30/15 0416 10/31/15 0521  NA 137 135  K 4.2 4.1  CL 105 106  CO2 24 21*  BUN 22* 12  CREATININE 1.20* 1.03*  GLUCOSE 152* 142*  CALCIUM 9.0 9.0    Recent Labs  10/29/15 1721  INR 0.98     EXAM General - Patient is Alert and Oriented Extremity - Sensation intact distally Dorsiflexion/Plantar flexion intact No cellulitis present Compartment soft Dressing/Incision - clean, dry, no drainage Motor Function - intact, moving foot and toes well on exam. The patient is ambulating 30 feet.  Past Medical History:  Diagnosis Date  . Arthritis   . Cataracts, both eyes   . GERD (gastroesophageal reflux disease)   . Gout   . History of fracture of patella    right  knee  . History of positive PPD    Patient always shows positive  . Hyperlipidemia   . Hypertension   . Hypothyroidism   . Lichen sclerosus 12/30/2013   of vulva  . Metatarsal fracture   . Polyneuropathy (HCC)    numbness and tingling in feet and toes  . Type 2 diabetes mellitus, uncontrolled (HCC)     Assessment/Plan: 2 Days Post-Op Procedure(s) (LRB): INTRAMEDULLARY (IM) NAIL INTERTROCHANTRIC  (Left) Active Problems:   Greater trochanter fracture (HCC)   Hip fracture (HCC)  Estimated body mass index is 26.63 kg/m as calculated from the following:   Height as of this encounter: 5\' 5"  (1.651 m).   Weight as of this encounter: 72.6 kg (160 lb). Advance diet Up with therapy D/C IV fluids Discharge to rehabilitation today or tomorrow, if cleared medically.  DVT Prophylaxis - Lovenox, Foot Pumps and TED hose Weight-Bearing as tolerated to left leg  Dedra Skeensodd Shadoe Cryan, PA-C Orthopaedic Surgery 11/01/2015, 6:33 AM

## 2015-11-01 NOTE — Clinical Social Work Note (Signed)
Patient to dc to [facility] via non-emergent EMS. Facility and family are aware. CSW will con't to follow pending any additional dc needs.  Argentina PonderKaren Martha Oshea Percival, MSW, LCSW-A [phone]

## 2015-11-01 NOTE — Progress Notes (Signed)
RN called report to [facility] for an hour. Left on hold every time. EMS called. Patient ready for transport.   Harvie HeckMelanie Kamrynn Melott, RN

## 2015-11-01 NOTE — Progress Notes (Signed)
Pharmacy Antibiotic Note  Cheryl PurserVeronica S Lariviere is a 70 y.o. female admitted on 10/29/2015 with pneumonia.  Pharmacy has been consulted for levofloxacin dosing.  Plan: Levofloxacin 500 mg PO daily  Height: 5\' 5"  (165.1 cm) Weight: 160 lb (72.6 kg) IBW/kg (Calculated) : 57  Temp (24hrs), Avg:99.4 F (37.4 C), Min:98.8 F (37.1 C), Max:100.4 F (38 C)   Recent Labs Lab 10/29/15 1721 10/30/15 0416 10/31/15 0521 11/01/15 0336  WBC 12.9*  --  12.4* 10.5  CREATININE 1.29* 1.20* 1.03*  --     Estimated Creatinine Clearance: 50.7 mL/min (by C-G formula based on SCr of 1.03 mg/dL (H)).    No Known Allergies  Antimicrobials this admission: cefazolin 10/27 >> 10/28 levofloxacin 10/29 >>   Dose adjustments this admission:  Microbiology results: None  Thank you for allowing pharmacy to be a part of this patient's care.  Cindi CarbonMary M Snigdha Howser, PharmD, BCPS Clinical Pharmacist 11/01/2015 7:29 AM

## 2015-11-01 NOTE — Discharge Instructions (Signed)
INSTRUCTIONS AFTER Surgery  o Remove items at home which could result in a fall. This includes throw rugs or furniture in walking pathways o ICE to the affected joint every three hours while awake for 30 minutes at a time, for at least the first 3-5 days, and then as needed for pain and swelling.  Continue to use ice for pain and swelling. You may notice swelling that will progress down to the foot and ankle.  This is normal after surgery.  Elevate your leg when you are not up walking on it.   o Continue to use the breathing machine you got in the hospital (incentive spirometer) which will help keep your temperature down.  It is common for your temperature to cycle up and down following surgery, especially at night when you are not up moving around and exerting yourself.  The breathing machine keeps your lungs expanded and your temperature down.   DIET:  As you were doing prior to hospitalization, we recommend a well-balanced diet.  DRESSING / WOUND CARE / SHOWERING  Honeycomb dressings are to be left in place. The patient is able to shower with these dressings on. Change dressings is becoming saturated.  ACTIVITY  o Increase activity slowly as tolerated, but follow the weight bearing instructions below.   o No driving for 6 weeks or until further direction given by your physician.  You cannot drive while taking narcotics.  o No lifting or carrying greater than 10 lbs. until further directed by your surgeon. o Avoid periods of inactivity such as sitting longer than an hour when not asleep. This helps prevent blood clots.  o You may return to work once you are authorized by your doctor.     WEIGHT BEARING  Weight-bearing as tolerated   EXERCISES Ambulation and gait training with a walker  CONSTIPATION  Constipation is defined medically as fewer than three stools per week and severe constipation as less than one stool per week.  Even if you have a regular bowel pattern at home, your  normal regimen is likely to be disrupted due to multiple reasons following surgery.  Combination of anesthesia, postoperative narcotics, change in appetite and fluid intake all can affect your bowels.   YOU MUST use at least one of the following options; they are listed in order of increasing strength to get the job done.  They are all available over the counter, and you may need to use some, POSSIBLY even all of these options:    Drink plenty of fluids (prune juice may be helpful) and high fiber foods Colace 100 mg by mouth twice a day  Senokot for constipation as directed and as needed Dulcolax (bisacodyl), take with full glass of water  Miralax (polyethylene glycol) once or twice a day as needed.  If you have tried all these things and are unable to have a bowel movement in the first 3-4 days after surgery call either your surgeon or your primary doctor.    If you experience loose stools or diarrhea, hold the medications until you stool forms back up.  If your symptoms do not get better within 1 week or if they get worse, check with your doctor.  If you experience "the worst abdominal pain ever" or develop nausea or vomiting, please contact the office immediately for further recommendations for treatment.   ITCHING:  If you experience itching with your medications, try taking only a single pain pill, or even half a pain pill at a time.  You can also use Benadryl over the counter for itching or also to help with sleep.   TED HOSE STOCKINGS:  Use stockings on both legs until for at least 2 weeks or as directed by physician office. They may be removed at night for sleeping.  MEDICATIONS:  See your medication summary on the After Visit Summary that nursing will review with you.  You may have some home medications which will be placed on hold until you complete the course of blood thinner medication.  It is important for you to complete the blood thinner medication as prescribed.  PRECAUTIONS:   If you experience chest pain or shortness of breath - call 911 immediately for transfer to the hospital emergency department.   If you develop a fever greater that 101 F, purulent drainage from wound, increased redness or drainage from wound, foul odor from the wound/dressing, or calf pain - CONTACT YOUR SURGEON.                                                   FOLLOW-UP APPOINTMENTS:  If you do not already have a post-op appointment, please call the office for an appointment to be seen by your surgeon.  Guidelines for how soon to be seen are listed in your After Visit Summary, but are typically between 1-4 weeks after surgery.  OTHER INSTRUCTIONS:     MAKE SURE YOU:   Understand these instructions.   Get help right away if you are not doing well or get worse.    Thank you for letting us be a part of your medical care team.  It is a privilege we respect greatly.  We hope these instructions will help you stay on track for a fast and full recovery!

## 2016-03-16 ENCOUNTER — Other Ambulatory Visit: Payer: Medicare Other

## 2016-03-23 ENCOUNTER — Ambulatory Visit: Payer: Medicare Other

## 2016-03-23 ENCOUNTER — Ambulatory Visit: Payer: Medicare Other | Admitting: Internal Medicine

## 2016-04-26 DIAGNOSIS — M818 Other osteoporosis without current pathological fracture: Secondary | ICD-10-CM | POA: Insufficient documentation

## 2016-06-02 DIAGNOSIS — E1140 Type 2 diabetes mellitus with diabetic neuropathy, unspecified: Secondary | ICD-10-CM | POA: Insufficient documentation

## 2016-08-10 ENCOUNTER — Other Ambulatory Visit: Payer: Self-pay | Admitting: Obstetrics and Gynecology

## 2016-08-10 DIAGNOSIS — Z1231 Encounter for screening mammogram for malignant neoplasm of breast: Secondary | ICD-10-CM

## 2016-10-04 IMAGING — US US CAROTID DUPLEX BILAT
1 series · 13 of 24 positions shown · non-contrast
Comparison: None.

CLINICAL DATA: 72-year-old female with a history carotid stenosis.

Cardiovascular risk factors include hypertension, diabetes, known
vascular surgery
EXAM:
BILATERAL CAROTID DUPLEX ULTRASOUND
TECHNIQUE: Gray scale imaging, color Doppler and duplex ultrasound were
performed of bilateral carotid and vertebral arteries in the neck.

[Series 1: us carotid duplex bilat · 13 of 62 slices shown]
[im 1/62]
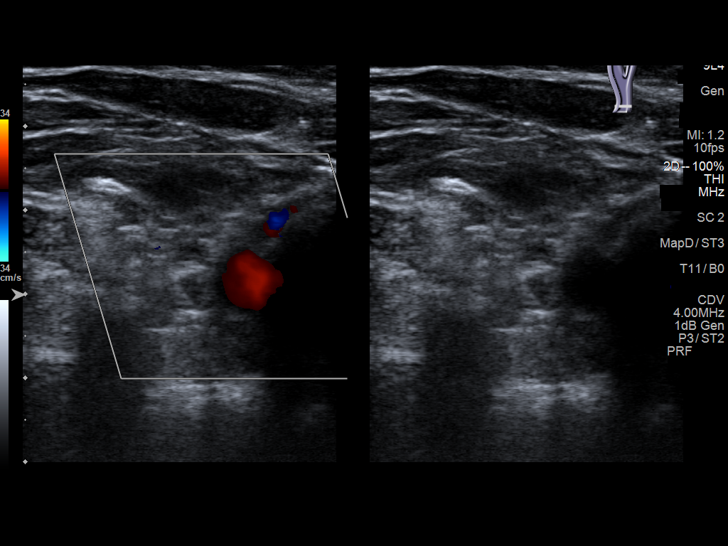
[im 6/62]
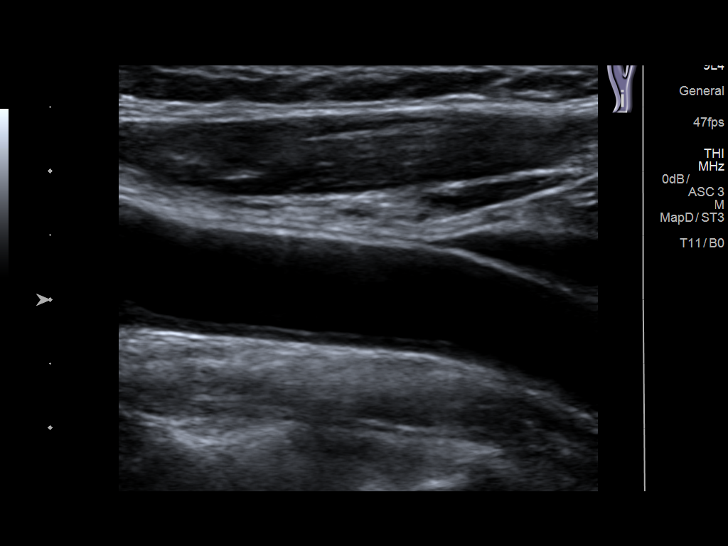
[im 11/62]
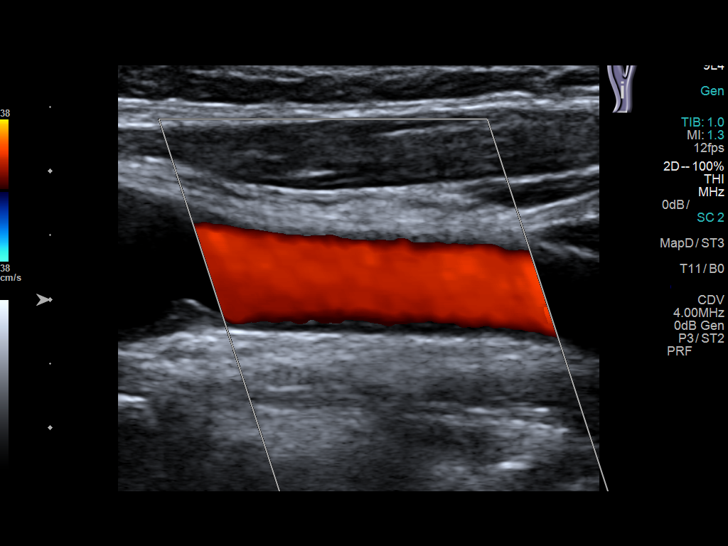
[im 16/62]
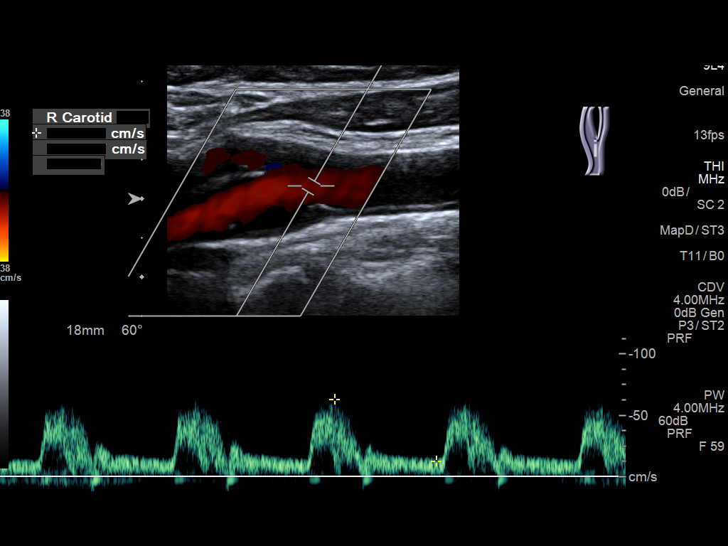
[im 22/62]
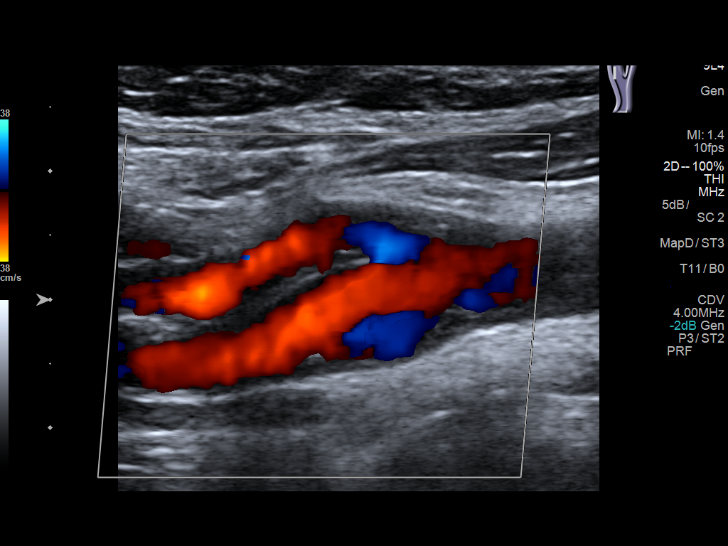
[im 27/62]
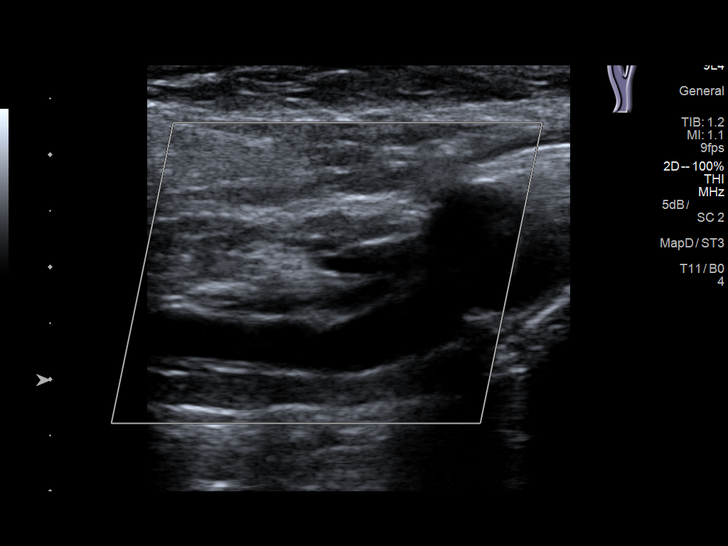
[im 32/62]
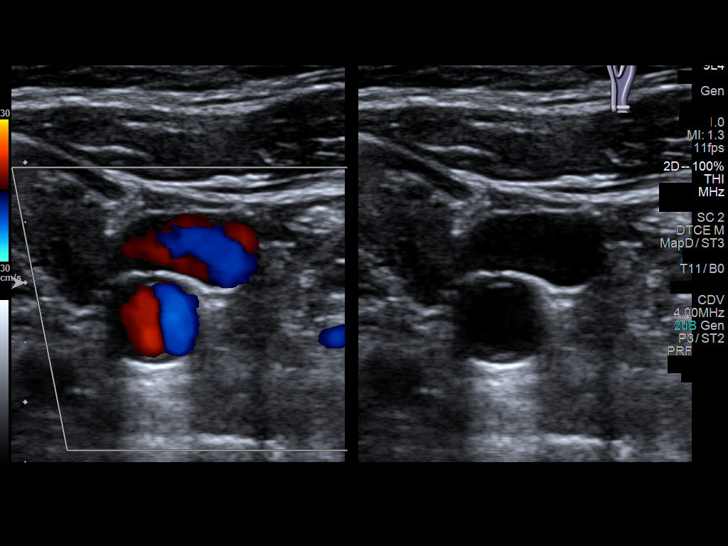
[im 35/62]
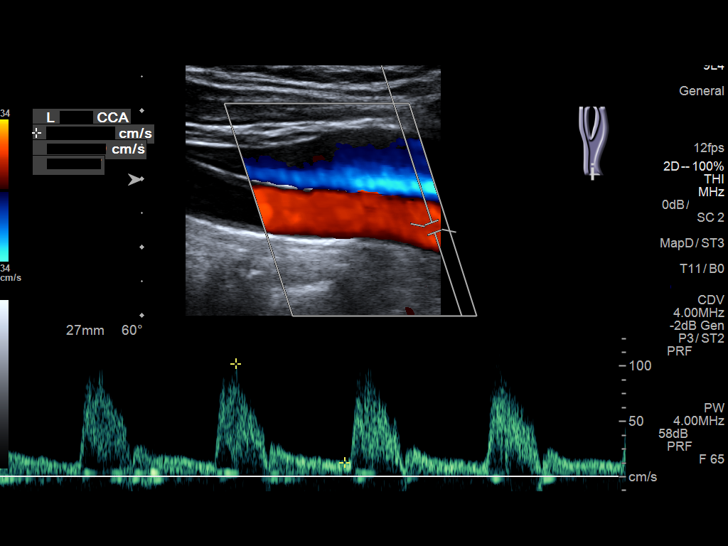
[im 40/62]
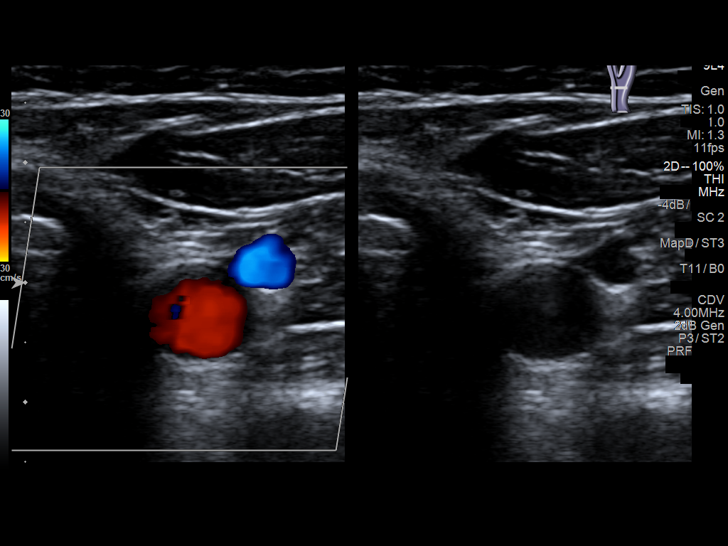
[im 46/62]
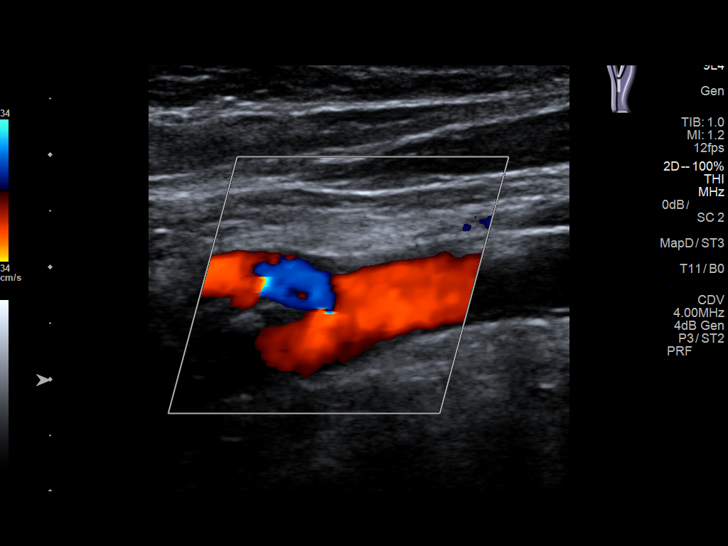
[im 51/62]
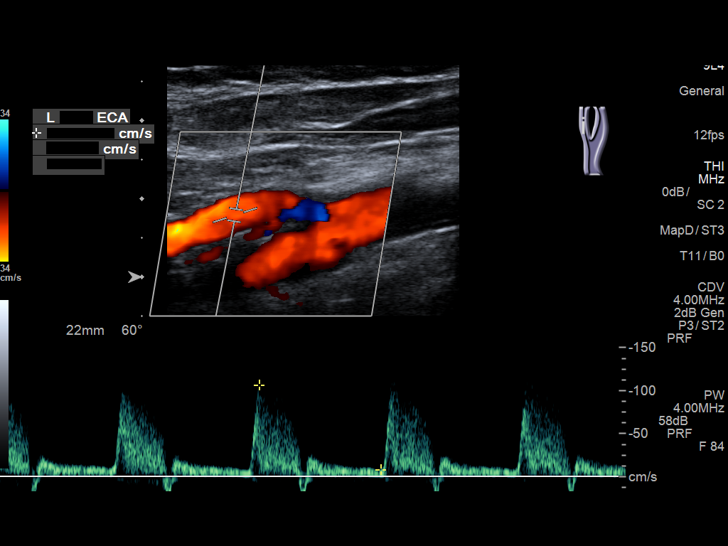
[im 56/62]
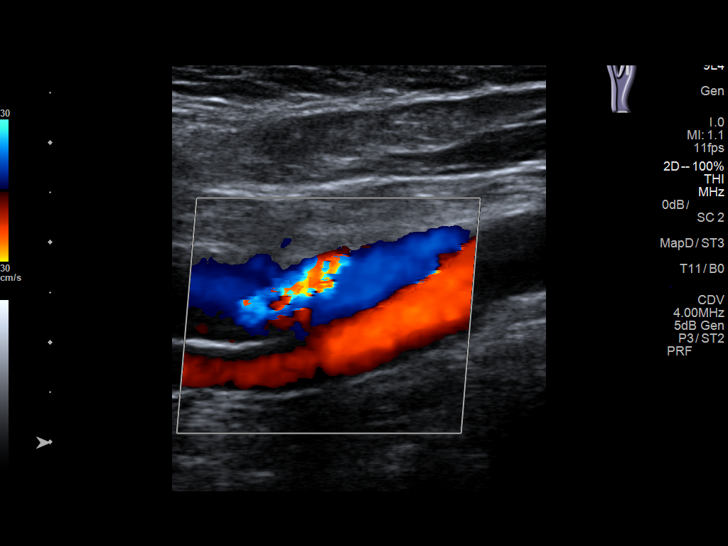
[im 62/62]
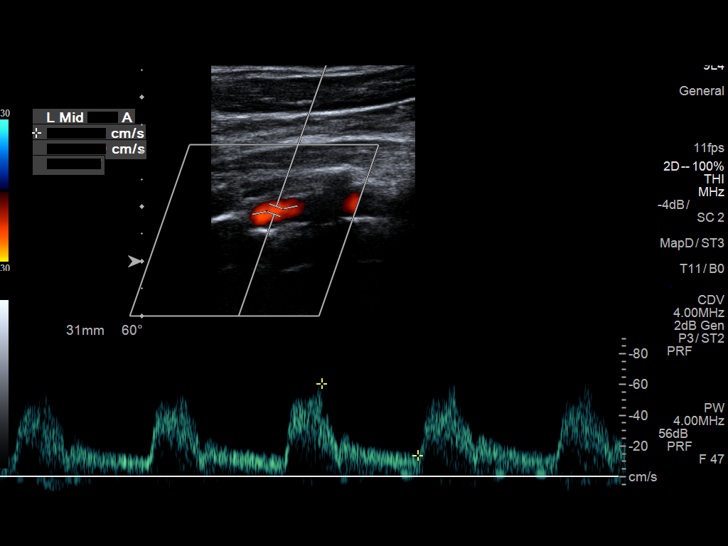

[13 of 24 positions shown; findings below may reference images not displayed]

FINDINGS: Criteria: Quantification of carotid stenosis is based on velocity
parameters that correlate the residual internal carotid diameter
with NASCET-based stenosis levels, using the diameter of the distal
internal carotid lumen as the denominator for stenosis measurement.

The following velocity measurements were obtained:

RIGHT

ICA:  Systolic 82 cm/sec, Diastolic 13 cm/sec

CCA:  89 cm/sec

SYSTOLIC ICA/CCA RATIO:

ECA:  122 cm/sec

LEFT

ICA:  Systolic 68 cm/sec, Diastolic 16 cm/sec

CCA:  86 cm/sec

SYSTOLIC ICA/CCA RATIO:  68

ECA:  107 cm/sec

Right Brachial SBP: Not acquired

Left Brachial SBP: Not acquired

RIGHT CAROTID ARTERY: No significant calcifications of the right
common carotid artery. Intermediate waveform maintained.
Heterogeneous and partially calcified plaque at the right carotid
bifurcation. No significant lumen shadowing. Low resistance waveform
of the right ICA. No significant tortuosity.

RIGHT VERTEBRAL ARTERY: Antegrade flow with low resistance waveform.

LEFT CAROTID ARTERY: No significant calcifications of the left
common carotid artery. Intermediate waveform maintained.
Heterogeneous and partially calcified plaque at the left carotid
bifurcation without significant lumen shadowing. Low resistance
waveform of the left ICA. No significant tortuosity.

LEFT VERTEBRAL ARTERY:  Antegrade flow with low resistance waveform.
IMPRESSION: Color duplex indicates minimal heterogeneous and calcified plaque,
with no hemodynamically significant stenosis by duplex criteria in
the extracranial cerebrovascular circulation.

## 2016-10-10 ENCOUNTER — Ambulatory Visit
Admission: RE | Admit: 2016-10-10 | Discharge: 2016-10-10 | Disposition: A | Discharge status: Home / Self Care | Payer: Medicare Other | Source: Ambulatory Visit | Attending: Obstetrics and Gynecology | Admitting: Obstetrics and Gynecology

## 2016-10-10 DIAGNOSIS — Z1231 Encounter for screening mammogram for malignant neoplasm of breast: Secondary | ICD-10-CM | POA: Diagnosis not present

## 2016-12-11 ENCOUNTER — Emergency Department: Payer: Medicare Other

## 2016-12-11 ENCOUNTER — Other Ambulatory Visit: Payer: Self-pay

## 2016-12-11 ENCOUNTER — Emergency Department
Admission: EM | Admit: 2016-12-11 | Discharge: 2016-12-11 | Disposition: A | Discharge status: Home / Self Care | Payer: Medicare Other | Attending: Emergency Medicine | Admitting: Emergency Medicine

## 2016-12-11 DIAGNOSIS — Z7901 Long term (current) use of anticoagulants: Secondary | ICD-10-CM | POA: Insufficient documentation

## 2016-12-11 DIAGNOSIS — Z7982 Long term (current) use of aspirin: Secondary | ICD-10-CM | POA: Diagnosis not present

## 2016-12-11 DIAGNOSIS — M25462 Effusion, left knee: Secondary | ICD-10-CM | POA: Diagnosis not present

## 2016-12-11 DIAGNOSIS — Z87891 Personal history of nicotine dependence: Secondary | ICD-10-CM | POA: Diagnosis not present

## 2016-12-11 DIAGNOSIS — I10 Essential (primary) hypertension: Secondary | ICD-10-CM | POA: Insufficient documentation

## 2016-12-11 DIAGNOSIS — Z79899 Other long term (current) drug therapy: Secondary | ICD-10-CM | POA: Insufficient documentation

## 2016-12-11 DIAGNOSIS — M25562 Pain in left knee: Secondary | ICD-10-CM | POA: Diagnosis present

## 2016-12-11 DIAGNOSIS — E039 Hypothyroidism, unspecified: Secondary | ICD-10-CM | POA: Insufficient documentation

## 2016-12-11 DIAGNOSIS — E119 Type 2 diabetes mellitus without complications: Secondary | ICD-10-CM | POA: Diagnosis not present

## 2016-12-11 IMAGING — CR DG KNEE COMPLETE 4+V*L*
4 series · 4 of 4 positions shown · non-contrast
Comparison: None.

CLINICAL DATA: Medial pain after a fall.

EXAM:
LEFT KNEE - COMPLETE 4+ VIEW

[knee ap]
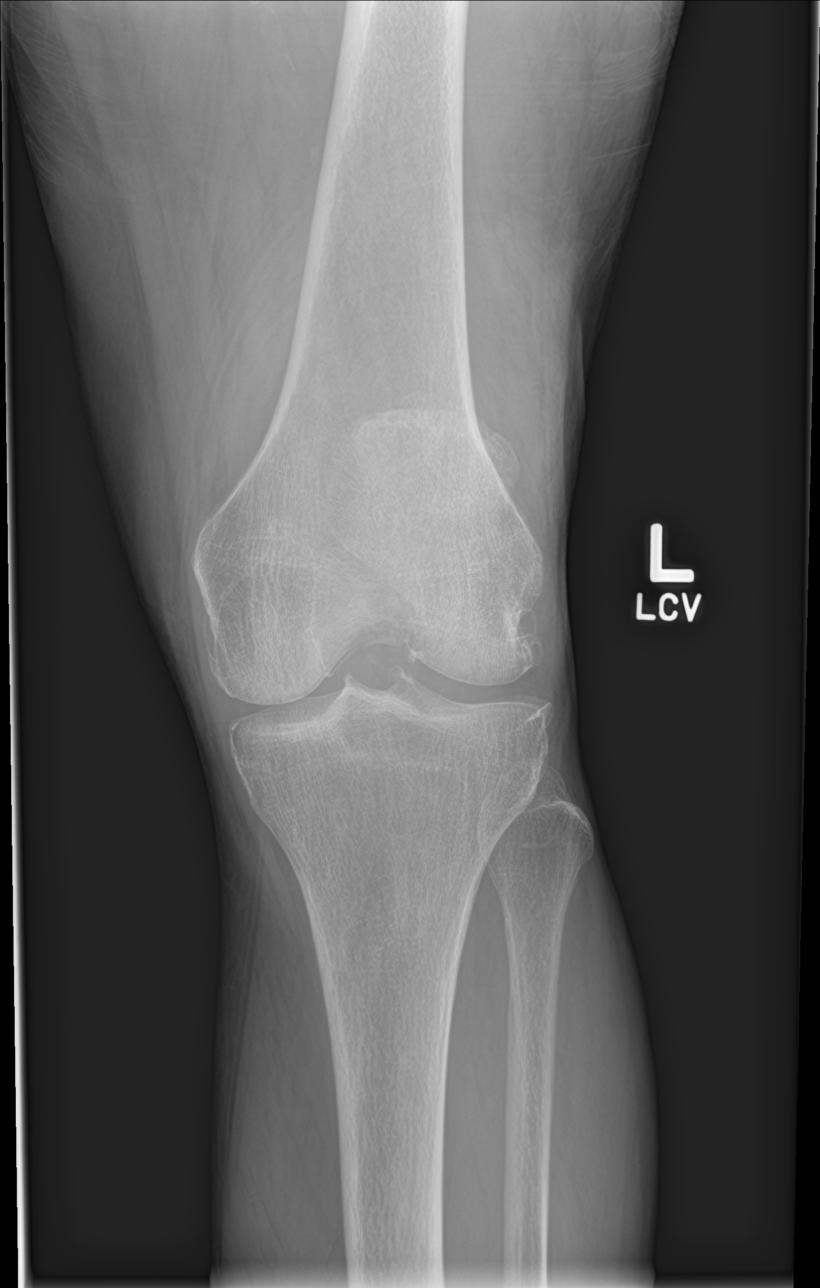

[knee obl (1 of 2)]
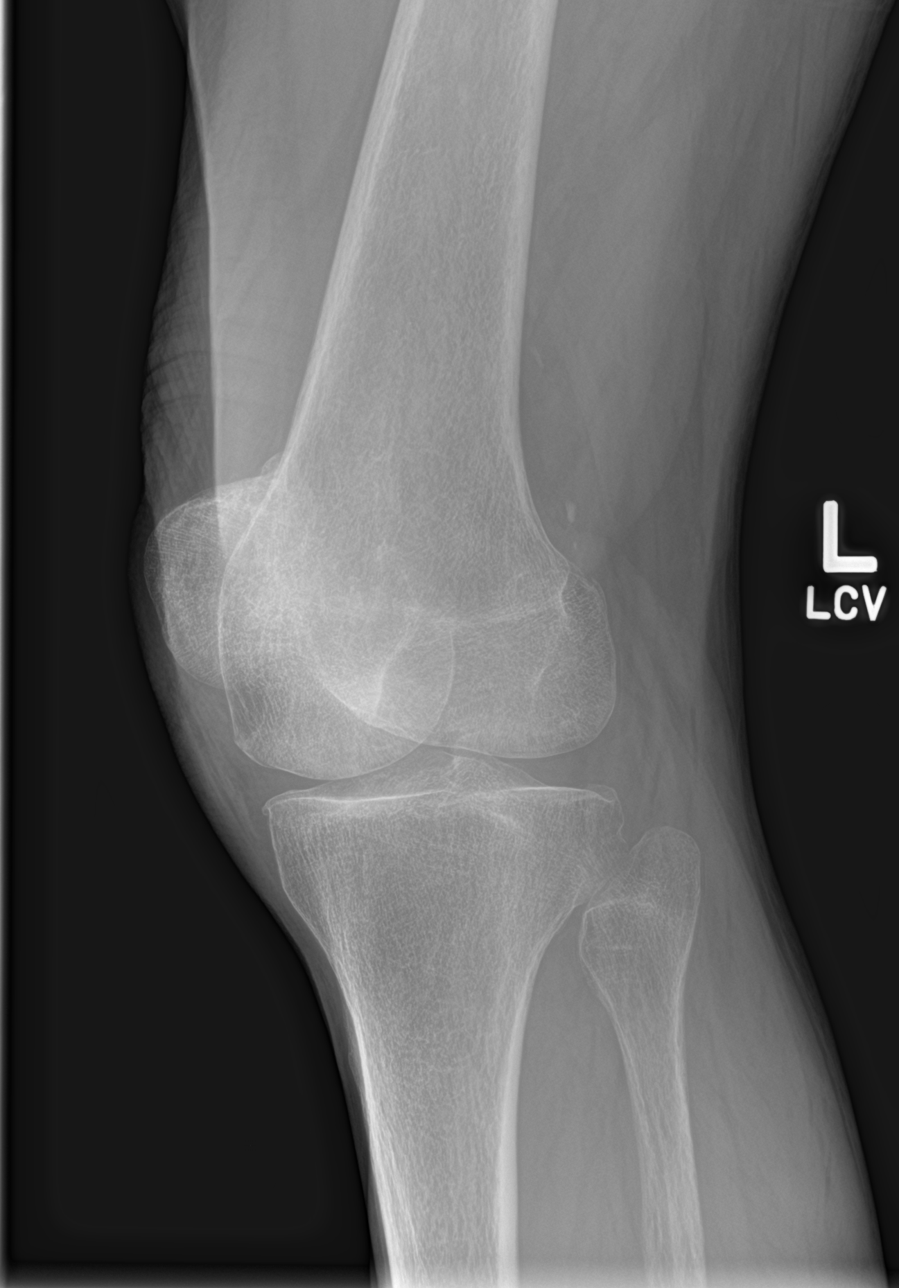

[knee obl (2 of 2)]
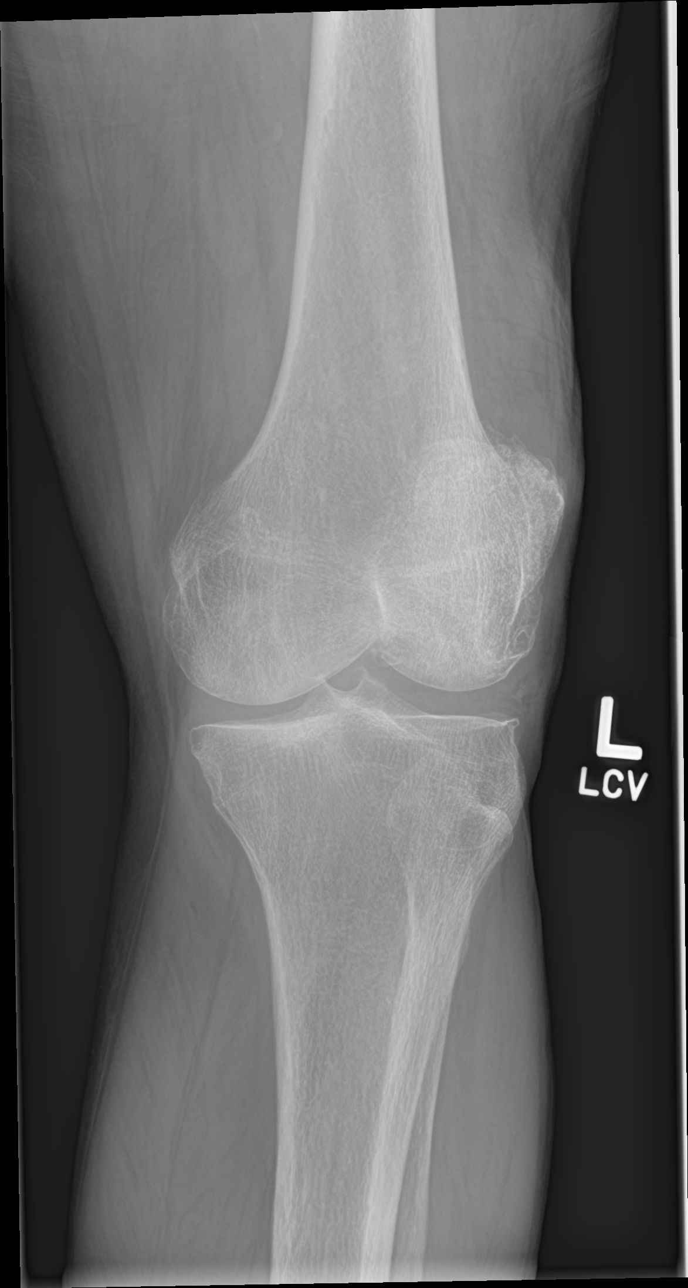

[knee lat]
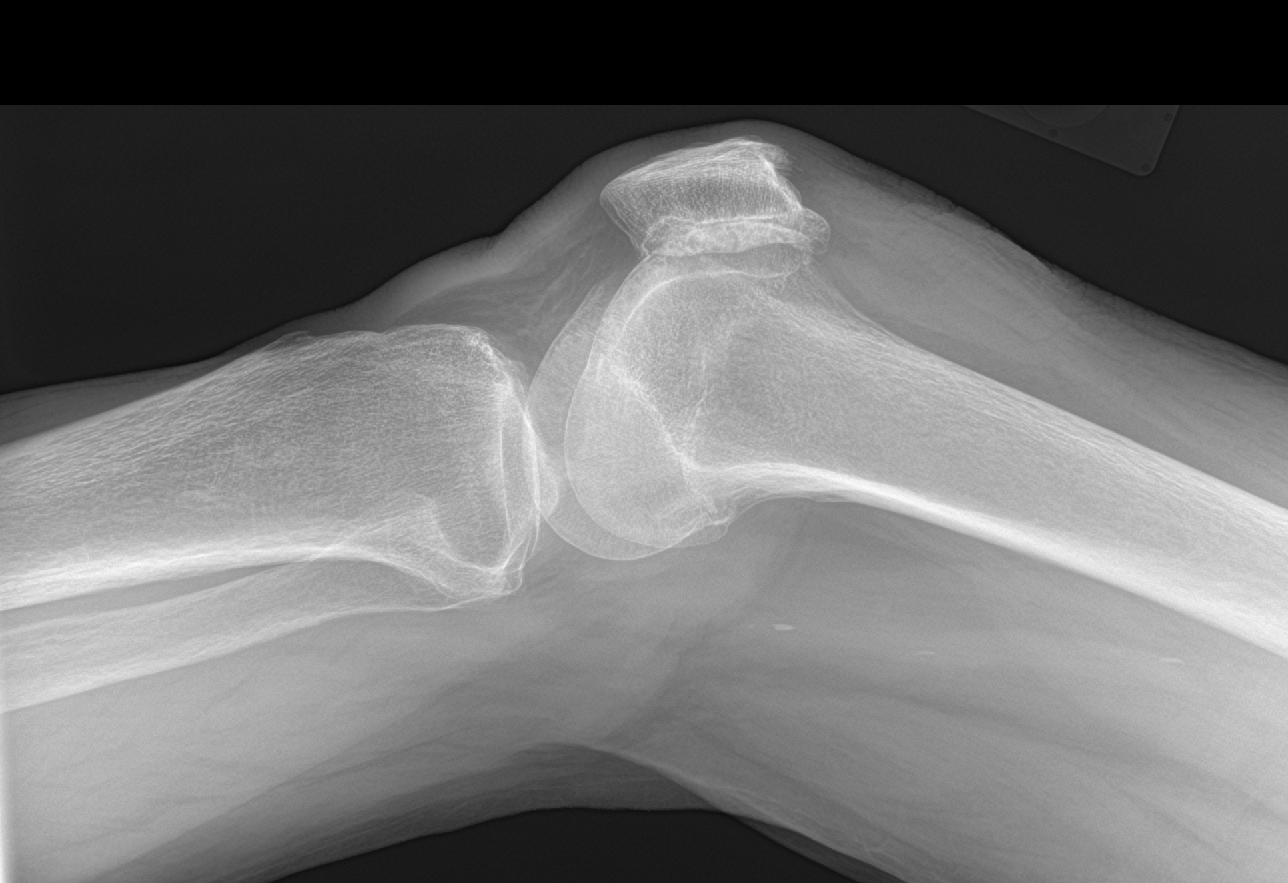

[4 of 4 positions shown; findings below may reference images not displayed]

FINDINGS: Moderate knee joint effusion. No weight-bearing compartment
degenerative change or narrowing. Patellofemoral osteoarthritis. No
sign of acute fracture.
IMPRESSION: No visible acute fracture. Patellofemoral osteoarthritis. Moderate
joint effusion.

## 2016-12-11 NOTE — ED Provider Notes (Signed)
[facility] Emergency Department Provider Note  ____________________________________________  Time seen: Approximately 9:06 AM  I have reviewed the triage vital signs and the nursing notes.   HISTORY  Chief Complaint Fall    HPI Cheryl Leonard is a 71 y.o. female that presents to the emergency department for evaluation of left knee pain after falling yesterday.  Patient was helping decorate for Christmas when she lost her footing on the steps.  She landed on her right side but hit the inside of her left knee.  She has been walking on her knee without difficulty.  Pain is worse with bending.  She states that once she gets moving it, she does not have any difficulty.  She did not hit her head or lose consciousness.  She has seen Dr. Rosita Kea in the past.  No additional injuries or concerns.  No numbness, tingling.   Past Medical History:  Diagnosis Date  . Arthritis   . Cataracts, both eyes   . GERD (gastroesophageal reflux disease)   . Gout   . History of fracture of patella    right knee  . History of positive PPD    Patient always shows positive  . Hyperlipidemia   . Hypertension   . Hypothyroidism   . Lichen sclerosus 12/30/2013   of vulva  . Metatarsal fracture   . Polyneuropathy    numbness and tingling in feet and toes  . Type 2 diabetes mellitus, uncontrolled (HCC)     Patient Active Problem List   Diagnosis Date Noted  . Hip fracture (HCC) 10/30/2015  . Greater trochanter fracture (HCC) 10/29/2015  . Iron deficiency anemia 09/22/2014  . Disease of thyroid gland 09/22/2014  . Diabetes mellitus type 2, uncontrolled (HCC) 05/13/2014    Past Surgical History:  Procedure Laterality Date  . APPENDECTOMY    . BREAST REDUCTION SURGERY  2001  . CATARACT EXTRACTION    . CESAREAN SECTION  1976  . COLONOSCOPY  03/05/2013   Nml - due for repeat 03/06/2018  . DILATION AND CURETTAGE OF UTERUS  1989  . ESOPHAGOGASTRODUODENOSCOPY  03/05/2013  .  Eyelid Surgery  2012  . INTRAMEDULLARY (IM) NAIL INTERTROCHANTERIC Left 10/30/2015   Procedure: INTRAMEDULLARY (IM) NAIL INTERTROCHANTRIC ;  Surgeon: Kennedy Bucker, MD;  Location: [facility] ORS;  Service: Orthopedics;  Laterality: Left;  . LAPAROSCOPIC HYSTERECTOMY  2000   total  . REDUCTION MAMMAPLASTY  1997    Prior to Admission medications   Medication Sig Start Date End Date Taking? Authorizing Provider  ALPRAZolam Prudy Feeler) 0.25 MG tablet Take 1 tablet by mouth as needed. For sleep    [provider]  aspirin EC 81 MG tablet Take 1 tablet by mouth daily.    [provider]  atorvastatin (LIPITOR) 20 MG tablet Take 20 mg by mouth at bedtime. 08/18/14   [provider]  buPROPion (WELLBUTRIN XL) 300 MG 24 hr tablet Take 1 tablet by mouth daily. 09/06/14   [provider]  BYSTOLIC 5 MG tablet Take 5 mg by mouth daily. 08/30/14   [provider]  DULoxetine (CYMBALTA) 60 MG capsule Take 1 capsule by mouth 2 (two) times daily. 06/17/14   [provider]  enoxaparin (LOVENOX) 40 MG/0.4ML injection Inject 0.4 mLs (40 mg total) into the skin daily. 11/01/15   Dedra Skeens, PA-C  gemfibrozil (LOPID) 600 MG tablet Take 600 mg by mouth 2 (two) times daily. 08/25/14   [provider]  HYDROcodone-acetaminophen (NORCO/VICODIN) 5-325 MG tablet Take  1-2 tablets by mouth every 4 (four) hours as needed for moderate pain. 11/01/15   Dedra SkeensMundy, Todd, PA-C  Iron-Vitamin C (VITRON-C) 65-125 MG TABS Take 1 tablet by mouth 2 (two) times daily.     [provider]  JANUMET 50-1000 MG per tablet Take 1 tablet by mouth 2 (two) times daily with a meal. 08/10/14   [provider]  levothyroxine (SYNTHROID, LEVOTHROID) 100 MCG tablet Take 100 mcg by mouth daily. 08/03/14   [provider]  lisinopril (PRINIVIL,ZESTRIL) 10 MG tablet Take 1 tablet by mouth daily.    [provider]  pantoprazole (PROTONIX) 40 MG tablet Take 40 mg by mouth 2  (two) times daily.    [provider]  ranitidine (ZANTAC) 300 MG capsule Take 300 mg by mouth at bedtime. 07/15/14   [provider]  TOUJEO SOLOSTAR 300 UNIT/ML SOPN Inject 30 Units into the skin at bedtime. 08/25/14   [provider]  zolpidem (AMBIEN) 10 MG tablet Take 10 mg by mouth at bedtime. 07/09/14   [provider]    Allergies Patient has no known allergies.  Family History  Problem Relation Age of Onset  . Coronary artery disease Father   . Heart attack Father   . Coronary artery disease Mother   . Heart attack Mother   . Ovarian cancer Sister [id number]       sister had hormonal therapy for IVF txs-which increased risk factor for ovarian cancer  . Breast cancer Neg Hx     Social History Social History   Tobacco Use  . Smoking status: Former Smoker    Packs/day: 1.00    Years: 20.00    Pack years: 20.00    Types: Cigarettes  . Smokeless tobacco: Never Used  . Tobacco comment: started smoking at age 71  Substance Use Topics  . Alcohol use: No    Alcohol/week: 0.0 oz  . Drug use: No     Review of Systems  Constitutional: No fever/chills Cardiovascular: No chest pain. Respiratory: No SOB. Gastrointestinal: No abdominal pain.  No nausea, no vomiting.   Musculoskeletal: Positive for knee pain. Skin: Negative for rash, abrasions, lacerations, ecchymosis. Neurological: Negative for headaches, numbness or tingling   ____________________________________________   PHYSICAL EXAM:  VITAL SIGNS: ED Triage Vitals  Enc Vitals Group     BP 12/11/16 0822 (!) 163/58     Pulse Rate 12/11/16 0822 80     Resp 12/11/16 0822 18     Temp 12/11/16 0822 98.1 F (36.7 C)     Temp Source 12/11/16 0822 Oral     SpO2 12/11/16 0822 99 %     Weight 12/11/16 0823 160 lb (72.6 kg)     Height 12/11/16 0823 5\' 5"  (1.651 m)     Head Circumference --      Peak Flow --      Pain Score 12/11/16 0822 7     Pain Loc --      Pain Edu? --      Excl. in  GC? --      Constitutional: Alert and oriented. Well appearing and in no acute distress. Eyes: Conjunctivae are normal. PERRL. EOMI. Head: Atraumatic. ENT:      Ears:      Nose: No congestion/rhinnorhea.      Mouth/Throat: Mucous membranes are moist.  Neck: No stridor.   Cardiovascular: Normal rate, regular rhythm.  Good peripheral circulation. Respiratory: Normal respiratory effort without tachypnea or retractions. Lungs CTAB. Good air  entry to the bases with no decreased or absent breath sounds. Musculoskeletal: Full range of motion to all extremities. No gross deformities appreciated.  Mild swelling and tenderness to palpation over medial left knee.  Full range of motion of knee.   Neurologic:  Normal speech and language. No gross focal neurologic deficits are appreciated.  Skin:  Skin is warm, dry and intact. No rash noted.   ____________________________________________   LABS (all labs ordered are listed, but only abnormal results are displayed)  Labs Reviewed - No data to display ____________________________________________  EKG   ____________________________________________  RADIOLOGY Lexine BatonI, Josefa Syracuse, personally viewed and evaluated these images (plain radiographs) as part of my medical decision making, as well as reviewing the written report by the radiologist.  Dg Knee Complete 4 Views Left  Result Date: 12/11/2016 CLINICAL DATA:  Medial pain after a fall. EXAM: LEFT KNEE - COMPLETE 4+ VIEW COMPARISON:  None. FINDINGS: Moderate knee joint effusion. No weight-bearing compartment degenerative change or narrowing. Patellofemoral osteoarthritis. No sign of acute fracture. IMPRESSION: No visible acute fracture. Patellofemoral osteoarthritis. Moderate joint effusion. Electronically Signed   By: Paulina FusiMark  Shogry M.D.   On: 12/11/2016 09:03    ____________________________________________    PROCEDURES  Procedure(s) performed:    Procedures    Medications - No data  to display   ____________________________________________   INITIAL IMPRESSION / ASSESSMENT AND PLAN / ED COURSE  Pertinent labs & imaging results that were available during my care of the patient were reviewed by me and considered in my medical decision making (see chart for details).  Review of the [place] CSRS was performed in accordance of the [facility] prior to dispensing any controlled drugs.   Patient's diagnosis is consistent with knee effusion.  Vital signs and exam are reassuring.  X-ray consistent with effusion.  Knee brace was placed.  Patient has a walker at home.  She will take ibuprofen.  Patient is to follow up with orthopedics as directed. Patient is given ED precautions to return to the ED for any worsening or new symptoms.     ____________________________________________  FINAL CLINICAL IMPRESSION(S) / ED DIAGNOSES  Final diagnoses:  Effusion of left knee      NEW MEDICATIONS STARTED DURING THIS VISIT:  ED Discharge Orders    None          This chart was dictated using voice recognition software/Dragon. Despite best efforts to proofread, errors can occur which can change the meaning. Any change was purely unintentional.    Enid DerryWagner, Yehonatan [place], PA-C 12/11/16 1157    Merrily Brittleifenbark, Neil, MD 12/11/16 1210

## 2016-12-11 NOTE — ED Triage Notes (Signed)
Pt presents with left knee pain after a fall last evening. States she fell on her left knee and it is swollen and bruised. States it doesn't hurt to walk on it, but bending it hurts. Pt alert & oriented; denies head injury or LOC. NAD noted.

## 2017-02-09 NOTE — Progress Notes (Signed)
[facility] - Clinic Note  02/10/2017     CHIEF COMPLAINT Patient presents for Retina Evaluation and Diabetic Eye Exam   HISTORY OF PRESENT ILLNESS: Cheryl Leonard is a 72 y.o. female who presents to the clinic today for:   HPI    Retina Evaluation    In both eyes.  This started 2 weeks ago.  Duration of 24 hours.  Associated Symptoms Floaters.  Negative for Flashes, Pain, Trauma, Weight Loss, Fever, Scalp Tenderness, Redness, Distortion, Blind Spot, Glare, Shoulder/Hip pain, Fatigue, Jaw Claudication and Photophobia.  Context:  distance vision, mid-range vision and near vision.  Treatments tried include eye drops.  Response to treatment was no improvement.  I, the attending physician,  performed the HPI with the patient and updated documentation appropriately.          Diabetic Eye Exam    Vision fluctuates with blood sugars.  Associated Symptoms Negative for Flashes, Pain, Trauma, Fever, Weight Loss, Scalp Tenderness, Redness, Floaters, Distortion, Photophobia, Jaw Claudication, Fatigue, Shoulder/Hip pain, Glare and Blind Spot.  Diabetes characteristics include Type 2.  This started 15 years ago.  Blood sugar level fluctuates.  Last Blood Glucose 104.  Last A1C 5.3.  I, the attending physician,  performed the HPI with the patient and updated documentation appropriately.          Comments    Referral of Dr. Senaida Oresichardson for DME. Patient states "she started having continuous large circles in her central vision of the right eye". Pt reports "Right eye is like looking through a  Mirror"Pt reports she has occasional floaters OD. Pt is DM2 , her Bs was 104 this am, A1C 5.3 Three months ago. Pt reports she had cataract sx 2012 OU. Pt is using Prednisolone eye gtts QID OD.        Last edited by Rennis ChrisZamora, Faizah Kandler, MD on 02/10/2017  8:34 AM. (History)    15 yr history of DM2. Well controlled.  Referring physician: Thereasa SoloRichardson, William [address] [place], [place]  [id number]  HISTORICAL INFORMATION:   Selected notes from the MEDICAL RECORD NUMBER Referred by Dr. Senaida Oresichardson for concern of DME OD;  LEE- 02.05.19 [BCVA OD: 20/70-1 OS: 20/30-1] Ocular Hx-  PMH- DM Lab Results  Component Value Date   HGBA1C 5.9 (H) 10/29/2015       CURRENT MEDICATIONS: Current Outpatient Medications (Ophthalmic Drugs)  Medication Sig  . prednisoLONE acetate (PRED FORTE) 1 % ophthalmic suspension USE 2 DROPS INTO RIGHT EYE EVERY 4 HOURS   No current facility-administered medications for this visit.  (Ophthalmic Drugs)   Current Outpatient Medications (Other)  Medication Sig  . ALPRAZolam (XANAX) 0.25 MG tablet Take 1 tablet by mouth as needed. For sleep  . ALPRAZolam (XANAX) 0.25 MG tablet Take by mouth.  [name]. aspirin EC 81 MG tablet Take 1 tablet by mouth daily.  [name]. aspirin EC 81 MG tablet Take by mouth.  [name]. atorvastatin (LIPITOR) 20 MG tablet Take 20 mg by mouth at bedtime.  [name]. atorvastatin (LIPITOR) 20 MG tablet TAKE 1 TABLET BY MOUTH AT BEDTIME  . buPROPion (WELLBUTRIN XL) 150 MG 24 hr tablet Take by mouth.  [name]. buPROPion (WELLBUTRIN XL) 300 MG 24 hr tablet Take 1 tablet by mouth daily.  [name]. BYSTOLIC 5 MG tablet Take 5 mg by mouth daily.  . DULoxetine (CYMBALTA) 60 MG capsule Take 1 capsule by mouth 2 (two) times daily.  [name]. enoxaparin (LOVENOX) 40 MG/0.4ML injection Inject 0.4 mLs (40 mg total) into  the skin daily.  [name] gemfibrozil (LOPID) 600 MG tablet Take 600 mg by mouth 2 (two) times daily.  . Insulin Degludec (TRESIBA FLEXTOUCH) 200 UNIT/ML SOPN Inject into the skin.  . Insulin Pen Needle (B-D UF III MINI PEN NEEDLES) 31G X 5 MM MISC once daily.  . Iron-Vitamin C (VITRON-C) 65-125 MG TABS Take 1 tablet by mouth 2 (two) times daily.   [name] JANUMET 50-1000 MG per tablet Take 1 tablet by mouth 2 (two) times daily with a meal.  . levothyroxine (SYNTHROID, LEVOTHROID) 100 MCG tablet Take 100 mcg by mouth daily.  [name] lisinopril (PRINIVIL,ZESTRIL) 10 MG tablet Take 1 tablet by mouth  daily.  . pantoprazole (PROTONIX) 40 MG tablet Take 40 mg by mouth 2 (two) times daily.  . ranitidine (ZANTAC) 300 MG capsule Take 300 mg by mouth at bedtime.  Nathen May SOLOSTAR 300 UNIT/ML SOPN Inject 30 Units into the skin at bedtime.  . TRESIBA FLEXTOUCH 200 UNIT/ML SOPN INJECT 30 UNITS SUB-Q NIGHTLY  . zolpidem (AMBIEN) 10 MG tablet Take 10 mg by mouth at bedtime.  [name] HYDROcodone-acetaminophen (NORCO/VICODIN) 5-325 MG tablet Take 1-2 tablets by mouth every 4 (four) hours as needed for moderate pain. (Patient not taking: Reported on 02/10/2017)   Current Facility-Administered Medications (Other)  Medication Route  . Bevacizumab (AVASTIN) SOLN 1.25 mg Intravitreal      REVIEW OF SYSTEMS: ROS    Positive for: Musculoskeletal, Endocrine, Eyes   Negative for: Constitutional, Gastrointestinal, Neurological, Skin, Genitourinary, HENT, Cardiovascular, Respiratory, Psychiatric, Allergic/Imm, Heme/Lymph   Last edited by Eldridge Scot, LPN on 01/08/1094  8:28 AM. (History)       ALLERGIES No Known Allergies  PAST MEDICAL HISTORY Past Medical History:  Diagnosis Date  . Arthritis   . Cataracts, both eyes   . GERD (gastroesophageal reflux disease)   . Gout   . History of fracture of patella    right knee  . History of positive PPD    Patient always shows positive  . Hyperlipidemia   . Hypertension   . Hypothyroidism   . Lichen sclerosus 12/30/2013   of vulva  . Metatarsal fracture   . Polyneuropathy    numbness and tingling in feet and toes  . Type 2 diabetes mellitus, uncontrolled (HCC)    Past Surgical History:  Procedure Laterality Date  . APPENDECTOMY    . BREAST REDUCTION SURGERY  2001  . CATARACT EXTRACTION    . CESAREAN SECTION  1976  . COLONOSCOPY  03/05/2013   Nml - due for repeat 03/06/2018  . DILATION AND CURETTAGE OF UTERUS  1989  . ESOPHAGOGASTRODUODENOSCOPY  03/05/2013  . EYE SURGERY    . Eyelid Surgery  2012  . INTRAMEDULLARY (IM) NAIL INTERTROCHANTERIC Left  10/30/2015   Procedure: INTRAMEDULLARY (IM) NAIL INTERTROCHANTRIC ;  Surgeon: Kennedy Bucker, MD;  Location: [facility] ORS;  Service: Orthopedics;  Laterality: Left;  . LAPAROSCOPIC HYSTERECTOMY  2000   total  . REDUCTION MAMMAPLASTY  1997    FAMILY HISTORY Family History  Problem Relation Age of Onset  . Coronary artery disease Father   . Heart attack Father   . Coronary artery disease Mother   . Heart attack Mother   . Ovarian cancer Sister 50       sister had hormonal therapy for IVF txs-which increased risk factor for ovarian cancer  . Breast cancer Neg Hx     SOCIAL HISTORY Social History   Tobacco Use  . Smoking status: Former Smoker  Packs/day: 1.00    Years: 20.00    Pack years: 20.00    Types: Cigarettes  . Smokeless tobacco: Never Used  . Tobacco comment: started smoking at age 24  Substance Use Topics  . Alcohol use: No    Alcohol/week: 0.0 oz  . Drug use: No         OPHTHALMIC EXAM:  Base Eye Exam    Visual Acuity (Snellen - Linear)      Right Left   Dist [place] 20/100 -2 20/25 -2   Dist ph [place] NI 20/25       Tonometry (Tonopen, 8:32 AM)      Right Left   Pressure 18 12       Pupils      Dark Light Shape React APD   Right 3 2 Round Brisk None   Left 3 2 Round Brisk None       Visual Fields (Counting fingers)      Left Right    Full Full       Extraocular Movement      Right Left    Full, Ortho Full, Ortho       Neuro/Psych    Oriented x3:  Yes   Mood/Affect:  Normal       Dilation    Both eyes:  1.0% Mydriacyl, 2.5% Phenylephrine @ 8:32 AM        Slit Lamp and Fundus Exam    External Exam      Right Left   External Normal Normal       Slit Lamp Exam      Right Left   Lids/Lashes dermatochalasis dermatochalasis   Conjunctiva/Sclera White and quiet White and quiet   Cornea arcus; well healed cataract wound; 1+ PEE arcus; well healed cataract wound; 1+ PEE   Anterior Chamber deep deep   Iris round; mod dilated round; mod  dilated   Lens PCIOL; open PC PCIOL; open PC   Vitreous syneresis syneresis       Fundus Exam      Right Left   Disc mild flame hemes superiorly Normal   C/D Ratio 0.4 0.5   Macula +CME flat; no heme or edema   Vessels mildly tortuous; dilated superior arcade venules; mild CWS along sup temp arcade; AV crossing changes mild tortuosity; AV crossing changes   Periphery attached; scattered dot hemes attached          IMAGING AND PROCEDURES  Imaging and Procedures for 02/10/17  OCT, Retina - OU - Both Eyes     Right Eye Quality was good. Central Foveal Thickness: 542. Progression has no prior data. Findings include abnormal foveal contour, epiretinal membrane, intraretinal fluid.   Left Eye Quality was good. Central Foveal Thickness: 287. Progression has no prior data. Findings include normal foveal contour, no IRF, no SRF.   Notes *Images captured and stored on drive  Diagnosis / Impression:  DME OD NFP; no IRF/SRF OS  Clinical management:  See below  Abbreviations: NFP - Normal foveal profile. CME - cystoid macular edema. PED - pigment epithelial detachment. IRF - intraretinal fluid. SRF - subretinal fluid. EZ - ellipsoid zone. ERM - epiretinal membrane. ORA - outer retinal atrophy. ORT - outer retinal tubulation. SRHM - subretinal hyper-reflective material        Intravitreal Injection, Pharmacologic Agent - OD - Right Eye     Time Out 02/10/2017. 9:53 AM. Confirmed correct patient, procedure, site, and patient consented.   Anesthesia Topical anesthesia  was used. Anesthetic medications included Lidocaine 2%, Tetracaine 0.5%.   Procedure Preparation included 5% betadine to ocular surface, eyelid speculum. A supplied needle was used.   Injection: 1.25 mg Bevacizumab 1.25mg /0.85ml   NDC: 60454-098-11    Lot: [id number]     Expiration Date: 03/22/2017   Route: Intravitreal   Site: Right Eye   Waste: 0 mg  Post-op Post injection exam found visual acuity of at  least counting fingers. The patient tolerated the procedure well. There were no complications. The patient received written and verbal post procedure care education.        Fluorescein Angiography Optos (Transit OD)     Right Eye Progression has no prior data. Early phase findings include leakage, microaneurysm. Mid/Late phase findings include leakage, microaneurysm, vascular perfusion defect.   Left Eye Progression has no prior data. Early phase findings include microaneurysm. Mid/Late phase findings include microaneurysm.   Notes Impression:  OD: petaloid hyperfluorescence in central macula; hyper fluorescence of disc, mild peripheral MAs with peripheral leakage late; mild peripheral capillary perfusion; OS: rare MAs with minimal leakage                 ASSESSMENT/PLAN:    ICD-10-CM   1. Branch retinal vein occlusion of right eye with macular edema H34.8310 Intravitreal Injection, Pharmacologic Agent - OD - Right Eye    Bevacizumab (AVASTIN) SOLN 1.25 mg  2. Retinal edema H35.81 OCT, Retina - OU - Both Eyes    Fluorescein Angiography Optos (Transit OD)  3. 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 Essential hypertension I10   5. Hypertensive retinopathy of both eyes H35.033   6. Epiretinal membrane (ERM) of right eye H35.371   7. Pseudophakia of both eyes Z96.1     1,2. CME OD -- suspect remote BRVO OD - by history, pt states symptoms first noticed 2 wks ago, but reports it changes may have occurred prior - exam with differential tortuosity of vessels (OD > OS) - OCT with central CME OD - FA with petaloid leakage and hyperfluorescence of the disc - differential includes DM2 (DME), hypertensive retinopathy, inflammatory etiology / uveitis - The natural history of retinal vein occlusion and macular edema and treatment options including observation, laser  photocoagulation, and intravitreal antiVEGF injection with Avastin and Lucentis and Eylea and intravitreal injection of steroids with triamcinolone and Ozurdex and the complications of these procedures including loss of vision, infection, cataract, glaucoma, and retinal detachment were discussed with patient. - Specifically discussed findings from CRUISE / BRAVO study regarding patient stabilization with anti-VEGF agents and increased potential for visual improvements.  Also discussed need for frequent follow up and potentially multiple injections given the chronic nature of the disease process - BCVA 20/100 - recommend IVA OD #1 today, 2.8.19 - RBA of procedure discussed, questions answered - informed consent obtained and signed - see procedure note - F/U 4 weeks -- DFE/OCT/possible injection  3. Mild nonproliferative diabetic retinopathy, both eyes - The incidence, risk factors for progression, natural history and treatment options for diabetic retinopathy were discussed with patient.   - The need for close monitoring of blood glucose, blood pressure, and serum lipids, avoiding cigarette or any type of tobacco, and the need for long term follow up was also discussed with patient. - could be contributing to CME OD - however OS with minimal diabetic retinopathy - will continue to monitor  4,5. Hypertensive retinopathy OU -  as above, may be contributing to CME OD - discussed importance of tight BP control - monitor  6. Epiretinal membrane, right eye  The natural history, anatomy, potential for loss of vision, and treatment options including vitrectomy techniques and the complications of endophthalmitis, retinal detachment, vitreous hemorrhage, cataract progression and permanent vision loss discussed with the patient.  7. Pseudophakia OU  - s/p CE/IOL OU by cataract surgeon in [facility]  - doing well  - monitor    Ophthalmic Meds Ordered this visit:  Meds ordered this encounter  Medications   . Bevacizumab (AVASTIN) SOLN 1.25 mg       Return in about 4 weeks (around 03/10/2017) for Dilated Exam, OCT, Possible Injxn.  There are no Patient Instructions on file for this visit.   Explained the diagnoses, plan, and follow up with the patient and they expressed understanding.  Patient expressed understanding of the importance of proper follow up care.   This document serves as a record of services personally performed by Karie Chimera, MD, PhD. It was created on their behalf by Virgilio Belling, COA, a certified ophthalmic assistant. The creation of this record is the provider's dictation and/or activities during the visit.  Electronically signed by: Virgilio Belling, COA  02/10/17 2:37 PM    Karie Chimera, M.D., Ph.D. Diseases & Surgery of the Retina and Vitreous [facility] 02/10/17   I have reviewed the above documentation for accuracy and completeness, and I agree with the above. Karie Chimera, M.D., Ph.D. 02/10/17 2:37 PM     Abbreviations: M myopia (nearsighted); A astigmatism; H hyperopia (farsighted); P presbyopia; Mrx spectacle prescription;  CTL contact lenses; OD right eye; OS left eye; OU both eyes  XT exotropia; ET esotropia; PEK punctate epithelial keratitis; PEE punctate epithelial erosions; DES dry eye syndrome; MGD meibomian gland dysfunction; ATs artificial tears; PFAT's preservative free artificial tears; NSC nuclear sclerotic cataract; PSC posterior subcapsular cataract; ERM epi-retinal membrane; PVD posterior vitreous detachment; RD retinal detachment; DM diabetes mellitus; DR diabetic retinopathy; NPDR non-proliferative diabetic retinopathy; PDR proliferative diabetic retinopathy; CSME clinically significant macular edema; DME diabetic macular edema; dbh dot blot hemorrhages; CWS cotton wool spot; POAG primary open angle glaucoma; C/D cup-to-disc ratio; HVF humphrey visual field; GVF goldmann visual field; OCT optical coherence  tomography; IOP intraocular pressure; BRVO Branch retinal vein occlusion; CRVO central retinal vein occlusion; CRAO central retinal artery occlusion; BRAO branch retinal artery occlusion; RT retinal tear; SB scleral buckle; PPV pars plana vitrectomy; VH Vitreous hemorrhage; PRP panretinal laser photocoagulation; IVK intravitreal kenalog; VMT vitreomacular traction; MH Macular hole;  NVD neovascularization of the disc; NVE neovascularization elsewhere; AREDS age related eye disease study; ARMD age related macular degeneration; POAG primary open angle glaucoma; EBMD epithelial/anterior basement membrane dystrophy; ACIOL anterior chamber intraocular lens; IOL intraocular lens; PCIOL posterior chamber intraocular lens; Phaco/IOL phacoemulsification with intraocular lens placement; PRK photorefractive keratectomy; LASIK laser assisted in situ keratomileusis; HTN hypertension; DM diabetes mellitus; COPD chronic obstructive pulmonary disease

## 2017-02-10 ENCOUNTER — Encounter (INDEPENDENT_AMBULATORY_CARE_PROVIDER_SITE_OTHER): Payer: Self-pay | Admitting: Ophthalmology

## 2017-02-10 ENCOUNTER — Ambulatory Visit (INDEPENDENT_AMBULATORY_CARE_PROVIDER_SITE_OTHER): Payer: Medicare Other | Admitting: Ophthalmology

## 2017-02-10 DIAGNOSIS — H35371 Puckering of macula, right eye: Secondary | ICD-10-CM | POA: Diagnosis not present

## 2017-02-10 DIAGNOSIS — H35033 Hypertensive retinopathy, bilateral: Secondary | ICD-10-CM | POA: Diagnosis not present

## 2017-02-10 DIAGNOSIS — H3581 Retinal edema: Secondary | ICD-10-CM

## 2017-02-10 DIAGNOSIS — E113213 Type 2 diabetes mellitus with mild nonproliferative diabetic retinopathy with macular edema, bilateral: Secondary | ICD-10-CM

## 2017-02-10 DIAGNOSIS — Z961 Presence of intraocular lens: Secondary | ICD-10-CM

## 2017-02-10 DIAGNOSIS — I10 Essential (primary) hypertension: Secondary | ICD-10-CM | POA: Diagnosis not present

## 2017-02-10 DIAGNOSIS — H34831 Tributary (branch) retinal vein occlusion, right eye, with macular edema: Secondary | ICD-10-CM | POA: Diagnosis not present

## 2017-02-10 MED ORDER — BEVACIZUMAB CHEMO INJECTION 1.25MG/0.05ML SYRINGE FOR KALEIDOSCOPE
1.2500 mg | INTRAVITREAL | Status: DC
  Administered 2017-02-10: 1.25 mg via INTRAVITREAL

## 2017-03-01 ENCOUNTER — Ambulatory Visit (INDEPENDENT_AMBULATORY_CARE_PROVIDER_SITE_OTHER): Payer: Medicare Other

## 2017-03-01 ENCOUNTER — Encounter (INDEPENDENT_AMBULATORY_CARE_PROVIDER_SITE_OTHER): Payer: Self-pay

## 2017-03-01 ENCOUNTER — Ambulatory Visit (INDEPENDENT_AMBULATORY_CARE_PROVIDER_SITE_OTHER): Payer: Medicare Other | Admitting: Vascular Surgery

## 2017-03-01 ENCOUNTER — Encounter (INDEPENDENT_AMBULATORY_CARE_PROVIDER_SITE_OTHER): Payer: Self-pay | Admitting: Vascular Surgery

## 2017-03-01 ENCOUNTER — Other Ambulatory Visit (INDEPENDENT_AMBULATORY_CARE_PROVIDER_SITE_OTHER): Payer: Self-pay | Admitting: Vascular Surgery

## 2017-03-01 VITALS — BP 122/74 | HR 82 | Resp 17 | Ht 65.5 in | Wt 167.4 lb

## 2017-03-01 DIAGNOSIS — I70299 Other atherosclerosis of native arteries of extremities, unspecified extremity: Secondary | ICD-10-CM

## 2017-03-01 DIAGNOSIS — E1165 Type 2 diabetes mellitus with hyperglycemia: Secondary | ICD-10-CM

## 2017-03-01 DIAGNOSIS — L97529 Non-pressure chronic ulcer of other part of left foot with unspecified severity: Secondary | ICD-10-CM | POA: Insufficient documentation

## 2017-03-01 DIAGNOSIS — I739 Peripheral vascular disease, unspecified: Secondary | ICD-10-CM

## 2017-03-01 DIAGNOSIS — L97909 Non-pressure chronic ulcer of unspecified part of unspecified lower leg with unspecified severity: Secondary | ICD-10-CM

## 2017-03-01 DIAGNOSIS — L97522 Non-pressure chronic ulcer of other part of left foot with fat layer exposed: Secondary | ICD-10-CM

## 2017-03-01 DIAGNOSIS — E785 Hyperlipidemia, unspecified: Secondary | ICD-10-CM | POA: Diagnosis not present

## 2017-03-01 DIAGNOSIS — E11621 Type 2 diabetes mellitus with foot ulcer: Secondary | ICD-10-CM | POA: Insufficient documentation

## 2017-03-01 DIAGNOSIS — I10 Essential (primary) hypertension: Secondary | ICD-10-CM | POA: Diagnosis not present

## 2017-03-01 NOTE — Progress Notes (Signed)
Subjective:    Patient ID: Cheryl Leonard, female    DOB: Aug 04, 1945, 72 y.o.   MRN: [id number] Chief Complaint  Patient presents with  . New Patient (Initial Visit)    ref Troxler for claudication with abi   Presents as a new patient referred by Dr. Orland Jarred for evaluation of a slow healing left big toe ulceration.  The patient endorses history of the presence of a ulceration for approximately 6 weeks.  The patient has been receiving local wound care from Dr. Orland Jarred however has been experiencing slow wound healing.  The patient notes discomfort to the left lower extremity when walking uphill or long distances.  The patient notes cramping to the left calf.  The patient denies any rest pain.  The patient underwent a bilateral ABI which was notable for no evidence of significant right lower extremity arterial disease.  The right toe brachial index is normal.  Resting left upper ankle brachial index indicates moderate left lower extremity arterial disease.  Right ABI 1.01, left ABI 0.79.  There is no previous ABI for comparison.  The patient denies any fever, nausea vomiting.   Review of Systems  Constitutional: Negative.   HENT: Negative.   Eyes: Negative.   Respiratory: Negative.   Cardiovascular:       Lower extremity claudication Left big toe ulceration  Gastrointestinal: Negative.   Endocrine: Negative.   Genitourinary: Negative.   Musculoskeletal: Negative.   Skin: Negative.   Allergic/Immunologic: Negative.   Neurological: Negative.   Hematological: Negative.   Psychiatric/Behavioral: Negative.       Objective:   Physical Exam  Constitutional: She is oriented to person, place, and time. She appears well-developed and well-nourished. No distress.  HENT:  Head: Normocephalic and atraumatic.  Eyes: Conjunctivae are normal. Pupils are equal, round, and reactive to light.  Neck: Normal range of motion.  Cardiovascular: Normal rate, regular rhythm, normal heart sounds and  intact distal pulses.  Pulses:      Radial pulses are 2+ on the right side, and 2+ on the left side.       Dorsalis pedis pulses are 1+ on the right side.       Posterior tibial pulses are 1+ on the right side.  Hard to palpate pedal pulses to the left foot  Pulmonary/Chest: Effort normal and breath sounds normal.  Musculoskeletal: Normal range of motion. She exhibits no edema.  Neurological: She is alert and oriented to person, place, and time.  Skin: She is not diaphoretic.  Left big toe: 1 cm x 2 cm small noninfected ulceration noted.  Fat layer exposed.    Psychiatric: She has a normal mood and affect. Her behavior is normal. Judgment and thought content normal.  Vitals reviewed.  BP 122/74 (BP Location: Right Arm)   Pulse 82   Resp 17   Ht 5' 5.5" (1.664 m)   Wt 167 lb 6.4 oz (75.9 kg)   BMI 27.43 kg/m   Past Medical History:  Diagnosis Date  . Arthritis   . Cataracts, both eyes   . GERD (gastroesophageal reflux disease)   . Gout   . History of fracture of patella    right knee  . History of positive PPD    Patient always shows positive  . Hyperlipidemia   . Hypertension   . Hypothyroidism   . Lichen sclerosus 12/30/2013   of vulva  . Metatarsal fracture   . Polyneuropathy    numbness and tingling in feet and toes  .  Type 2 diabetes mellitus, uncontrolled (HCC)    Social History   Socioeconomic History  . Marital status: Married    Spouse name: Not on file  . Number of children: Not on file  . Years of education: Not on file  . Highest education level: Not on file  Social Needs  . Financial resource strain: Not on file  . Food insecurity - worry: Not on file  . Food insecurity - inability: Not on file  . Transportation needs - medical: Not on file  . Transportation needs - non-medical: Not on file  Occupational History  . Not on file  Tobacco Use  . Smoking status: Former Smoker    Packs/day: 1.00    Years: 20.00    Pack years: 20.00    Types:  Cigarettes  . Smokeless tobacco: Never Used  . Tobacco comment: started smoking at age 82  Substance and Sexual Activity  . Alcohol use: No    Alcohol/week: 0.0 oz  . Drug use: No  . Sexual activity: Yes    Partners: Male    Birth control/protection: Surgical  Other Topics Concern  . Not on file  Social History Narrative  . Not on file   Past Surgical History:  Procedure Laterality Date  . APPENDECTOMY    . BREAST REDUCTION SURGERY  2001  . CATARACT EXTRACTION    . CESAREAN SECTION  1976  . COLONOSCOPY  03/05/2013   Nml - due for repeat 03/06/2018  . DILATION AND CURETTAGE OF UTERUS  1989  . ESOPHAGOGASTRODUODENOSCOPY  03/05/2013  . EYE SURGERY    . Eyelid Surgery  2012  . INTRAMEDULLARY (IM) NAIL INTERTROCHANTERIC Left 10/30/2015   Procedure: INTRAMEDULLARY (IM) NAIL INTERTROCHANTRIC ;  Surgeon: Kennedy Bucker, MD;  Location: [facility] ORS;  Service: Orthopedics;  Laterality: Left;  . LAPAROSCOPIC HYSTERECTOMY  2000   total  . REDUCTION MAMMAPLASTY  1997   Family History  Problem Relation Age of Onset  . Coronary artery disease Father   . Heart attack Father   . Coronary artery disease Mother   . Heart attack Mother   . Ovarian cancer Sister 16       sister had hormonal therapy for IVF txs-which increased risk factor for ovarian cancer  . Breast cancer Neg Hx    No Known Allergies     Assessment & Plan:  Presents as a new patient referred by Dr. Orland Jarred for evaluation of a slow healing left big toe ulceration.  The patient endorses history of the presence of a ulceration for approximately 6 weeks.  The patient has been receiving local wound care from Dr. Orland Jarred however has been experiencing slow wound healing.  The patient notes discomfort to the left lower extremity when walking uphill or long distances.  The patient notes cramping to the left calf.  The patient denies any rest pain.  The patient underwent a bilateral ABI which was notable for no evidence of significant right  lower extremity arterial disease.  The right toe brachial index is normal.  Resting left upper ankle brachial index indicates moderate left lower extremity arterial disease.  Right ABI 1.01, left ABI 0.79.  There is no previous ABI for comparison.  The patient denies any fever, nausea vomiting.  1. PAD (peripheral artery disease) (HCC) - New Patient with multiple risk factors for peripheral artery disease Hard to palpate pedal pulses to the left foot Patient with a slow healing ulceration to the left big toe for approximately 6 weeks  Patient experiences left calf claudication with activity Left ABI with moderate arterial occlusive disease Recommend a left lower extremity angiogram with possible intervention in an effort to assess the patient's anatomy and degree of contributing peripheral artery disease.  If appropriate at that time an attempt to revascularize the leg can take place Procedure, risks and benefits explained to the patient All questions answered The patient wishes to proceed  2. Uncontrolled type 2 diabetes mellitus with hyperglycemia (HCC) - Stable Encouraged good control as its slows the progression of atherosclerotic disease  3. Hyperlipidemia, unspecified hyperlipidemia type - Stable Encouraged good control as its slows the progression of atherosclerotic disease  4. Essential hypertension - Stable Stable  5. Skin ulcer of left great toe with fat layer exposed (HCC) - New As above  Current Outpatient Medications on File Prior to Visit  Medication Sig Dispense Refill  . ALPRAZolam (XANAX) 0.25 MG tablet Take 1 tablet by mouth as needed. For sleep    . ALPRAZolam (XANAX) 0.25 MG tablet Take by mouth.    [name]. aspirin EC 81 MG tablet Take 1 tablet by mouth daily.    [name]. aspirin EC 81 MG tablet Take by mouth.    [name]. atorvastatin (LIPITOR) 20 MG tablet Take 20 mg by mouth at bedtime.  3  . atorvastatin (LIPITOR) 20 MG tablet TAKE 1 TABLET BY MOUTH AT BEDTIME    . buPROPion  (WELLBUTRIN XL) 150 MG 24 hr tablet Take by mouth.    [name]. buPROPion (WELLBUTRIN XL) 300 MG 24 hr tablet Take 1 tablet by mouth daily.  11  . BYSTOLIC 5 MG tablet Take 5 mg by mouth daily.  2  . DULoxetine (CYMBALTA) 60 MG capsule Take 1 capsule by mouth 2 (two) times daily.    [name]. enoxaparin (LOVENOX) 40 MG/0.4ML injection Inject 0.4 mLs (40 mg total) into the skin daily. 14 Syringe 0  . gemfibrozil (LOPID) 600 MG tablet Take 600 mg by mouth 2 (two) times daily.  3  . HYDROcodone-acetaminophen (NORCO/VICODIN) 5-325 MG tablet Take 1-2 tablets by mouth every 4 (four) hours as needed for moderate pain. 30 tablet 0  . Insulin Degludec (TRESIBA FLEXTOUCH) 200 UNIT/ML SOPN Inject into the skin.    . Insulin Pen Needle (B-D UF III MINI PEN NEEDLES) 31G X 5 MM MISC once daily.    . Iron-Vitamin C (VITRON-C) 65-125 MG TABS Take 1 tablet by mouth 2 (two) times daily.     [name]. JANUMET 50-1000 MG per tablet Take 1 tablet by mouth 2 (two) times daily with a meal.  3  . levothyroxine (SYNTHROID, LEVOTHROID) 100 MCG tablet Take 100 mcg by mouth daily.  3  . lisinopril (PRINIVIL,ZESTRIL) 10 MG tablet Take 1 tablet by mouth daily.    . pantoprazole (PROTONIX) 40 MG tablet Take 40 mg by mouth 2 (two) times daily.    . prednisoLONE acetate (PRED FORTE) 1 % ophthalmic suspension USE 2 DROPS INTO RIGHT EYE EVERY 4 HOURS  0  . ranitidine (ZANTAC) 300 MG capsule Take 300 mg by mouth at bedtime.  1  . TOUJEO SOLOSTAR 300 UNIT/ML SOPN Inject 30 Units into the skin at bedtime.  7  . TRESIBA FLEXTOUCH 200 UNIT/ML SOPN INJECT 30 UNITS SUB-Q NIGHTLY  5  . zolpidem (AMBIEN) 10 MG tablet Take 10 mg by mouth at bedtime.  5   Current Facility-Administered Medications on File Prior to Visit  Medication Dose Route Frequency Provider Last Rate Last Dose  . Bevacizumab (AVASTIN) SOLN  1.25 mg  1.25 mg Intravitreal  [name], MD   1.25 mg at 02/10/17 1610   There are no Patient Instructions on file for this visit. No Follow-up on  file.  Layza Summa A Rogerick Baldwin, PA-C

## 2017-03-06 ENCOUNTER — Other Ambulatory Visit (INDEPENDENT_AMBULATORY_CARE_PROVIDER_SITE_OTHER): Payer: Self-pay | Admitting: Vascular Surgery

## 2017-03-07 ENCOUNTER — Encounter
Admission: RE | Admit: 2017-03-07 | Discharge: 2017-03-07 | Disposition: A | Discharge status: Home / Self Care | Payer: Medicare Other | Source: Ambulatory Visit | Attending: Vascular Surgery | Admitting: Vascular Surgery

## 2017-03-07 ENCOUNTER — Inpatient Hospital Stay: Admission: RE | Admit: 2017-03-07 | Payer: PRIVATE HEALTH INSURANCE | Source: Ambulatory Visit

## 2017-03-07 DIAGNOSIS — E079 Disorder of thyroid, unspecified: Secondary | ICD-10-CM | POA: Diagnosis not present

## 2017-03-07 DIAGNOSIS — L97522 Non-pressure chronic ulcer of other part of left foot with fat layer exposed: Secondary | ICD-10-CM | POA: Diagnosis not present

## 2017-03-07 DIAGNOSIS — I7025 Atherosclerosis of native arteries of other extremities with ulceration: Secondary | ICD-10-CM | POA: Diagnosis not present

## 2017-03-07 DIAGNOSIS — Z79890 Hormone replacement therapy (postmenopausal): Secondary | ICD-10-CM | POA: Diagnosis not present

## 2017-03-07 DIAGNOSIS — E1165 Type 2 diabetes mellitus with hyperglycemia: Secondary | ICD-10-CM | POA: Diagnosis not present

## 2017-03-07 DIAGNOSIS — M1990 Unspecified osteoarthritis, unspecified site: Secondary | ICD-10-CM | POA: Diagnosis not present

## 2017-03-07 DIAGNOSIS — E11621 Type 2 diabetes mellitus with foot ulcer: Secondary | ICD-10-CM | POA: Diagnosis not present

## 2017-03-07 DIAGNOSIS — E1142 Type 2 diabetes mellitus with diabetic polyneuropathy: Secondary | ICD-10-CM | POA: Diagnosis not present

## 2017-03-07 DIAGNOSIS — H269 Unspecified cataract: Secondary | ICD-10-CM | POA: Diagnosis not present

## 2017-03-07 DIAGNOSIS — Z90710 Acquired absence of both cervix and uterus: Secondary | ICD-10-CM | POA: Diagnosis not present

## 2017-03-07 DIAGNOSIS — Z7982 Long term (current) use of aspirin: Secondary | ICD-10-CM | POA: Diagnosis not present

## 2017-03-07 DIAGNOSIS — Z87891 Personal history of nicotine dependence: Secondary | ICD-10-CM | POA: Diagnosis not present

## 2017-03-07 DIAGNOSIS — Z794 Long term (current) use of insulin: Secondary | ICD-10-CM | POA: Diagnosis not present

## 2017-03-07 DIAGNOSIS — E785 Hyperlipidemia, unspecified: Secondary | ICD-10-CM | POA: Diagnosis not present

## 2017-03-07 DIAGNOSIS — Z79899 Other long term (current) drug therapy: Secondary | ICD-10-CM | POA: Diagnosis not present

## 2017-03-07 DIAGNOSIS — Z9849 Cataract extraction status, unspecified eye: Secondary | ICD-10-CM | POA: Diagnosis not present

## 2017-03-07 DIAGNOSIS — K219 Gastro-esophageal reflux disease without esophagitis: Secondary | ICD-10-CM | POA: Diagnosis not present

## 2017-03-07 DIAGNOSIS — I10 Essential (primary) hypertension: Secondary | ICD-10-CM | POA: Diagnosis not present

## 2017-03-07 HISTORY — DX: Anxiety disorder, unspecified: F41.9

## 2017-03-07 HISTORY — DX: Polyneuropathy, unspecified: G62.9

## 2017-03-07 HISTORY — DX: Peripheral vascular disease, unspecified: I73.9

## 2017-03-07 LAB — CREATININE, SERUM
Creatinine, Ser: 1.4 mg/dL — ABNORMAL HIGH (ref 0.44–1.00)
GFR calc Af Amer: 43 mL/min — ABNORMAL LOW (ref >60)
GFR calc non Af Amer: 37 mL/min — ABNORMAL LOW (ref >60)

## 2017-03-07 LAB — BUN: BUN: 23 mg/dL — AB (ref 6–20)

## 2017-03-07 MED ORDER — CEFAZOLIN SODIUM-DEXTROSE 2-4 GM/100ML-% IV SOLN
2.0000 g | Freq: Once | INTRAVENOUS | Status: AC
  Administered 2017-03-08: 2 g via INTRAVENOUS

## 2017-03-08 ENCOUNTER — Encounter: Payer: Self-pay | Admitting: Emergency Medicine

## 2017-03-08 ENCOUNTER — Encounter
Admission: RE | Disposition: A | Discharge status: Home / Self Care | Payer: Self-pay | Source: Ambulatory Visit | Attending: Vascular Surgery

## 2017-03-08 ENCOUNTER — Ambulatory Visit
Admission: RE | Admit: 2017-03-08 | Discharge: 2017-03-08 | Disposition: A | Discharge status: Home / Self Care | Payer: Medicare Other | Source: Ambulatory Visit | Attending: Vascular Surgery | Admitting: Vascular Surgery

## 2017-03-08 DIAGNOSIS — Z79890 Hormone replacement therapy (postmenopausal): Secondary | ICD-10-CM | POA: Insufficient documentation

## 2017-03-08 DIAGNOSIS — Z7982 Long term (current) use of aspirin: Secondary | ICD-10-CM | POA: Insufficient documentation

## 2017-03-08 DIAGNOSIS — I7025 Atherosclerosis of native arteries of other extremities with ulceration: Secondary | ICD-10-CM | POA: Insufficient documentation

## 2017-03-08 DIAGNOSIS — I70299 Other atherosclerosis of native arteries of extremities, unspecified extremity: Secondary | ICD-10-CM

## 2017-03-08 DIAGNOSIS — Z79899 Other long term (current) drug therapy: Secondary | ICD-10-CM | POA: Insufficient documentation

## 2017-03-08 DIAGNOSIS — E11621 Type 2 diabetes mellitus with foot ulcer: Secondary | ICD-10-CM | POA: Insufficient documentation

## 2017-03-08 DIAGNOSIS — L97522 Non-pressure chronic ulcer of other part of left foot with fat layer exposed: Secondary | ICD-10-CM | POA: Insufficient documentation

## 2017-03-08 DIAGNOSIS — K219 Gastro-esophageal reflux disease without esophagitis: Secondary | ICD-10-CM | POA: Insufficient documentation

## 2017-03-08 DIAGNOSIS — H269 Unspecified cataract: Secondary | ICD-10-CM | POA: Insufficient documentation

## 2017-03-08 DIAGNOSIS — I10 Essential (primary) hypertension: Secondary | ICD-10-CM | POA: Insufficient documentation

## 2017-03-08 DIAGNOSIS — Z87891 Personal history of nicotine dependence: Secondary | ICD-10-CM | POA: Insufficient documentation

## 2017-03-08 DIAGNOSIS — E785 Hyperlipidemia, unspecified: Secondary | ICD-10-CM | POA: Insufficient documentation

## 2017-03-08 DIAGNOSIS — I70248 Atherosclerosis of native arteries of left leg with ulceration of other part of lower left leg: Secondary | ICD-10-CM

## 2017-03-08 DIAGNOSIS — E079 Disorder of thyroid, unspecified: Secondary | ICD-10-CM | POA: Insufficient documentation

## 2017-03-08 DIAGNOSIS — Z9849 Cataract extraction status, unspecified eye: Secondary | ICD-10-CM | POA: Insufficient documentation

## 2017-03-08 DIAGNOSIS — E1142 Type 2 diabetes mellitus with diabetic polyneuropathy: Secondary | ICD-10-CM | POA: Insufficient documentation

## 2017-03-08 DIAGNOSIS — Z90710 Acquired absence of both cervix and uterus: Secondary | ICD-10-CM | POA: Insufficient documentation

## 2017-03-08 DIAGNOSIS — Z794 Long term (current) use of insulin: Secondary | ICD-10-CM | POA: Insufficient documentation

## 2017-03-08 DIAGNOSIS — E1165 Type 2 diabetes mellitus with hyperglycemia: Secondary | ICD-10-CM | POA: Insufficient documentation

## 2017-03-08 DIAGNOSIS — Z8249 Family history of ischemic heart disease and other diseases of the circulatory system: Secondary | ICD-10-CM | POA: Insufficient documentation

## 2017-03-08 DIAGNOSIS — M1990 Unspecified osteoarthritis, unspecified site: Secondary | ICD-10-CM | POA: Insufficient documentation

## 2017-03-08 DIAGNOSIS — Z9889 Other specified postprocedural states: Secondary | ICD-10-CM | POA: Insufficient documentation

## 2017-03-08 DIAGNOSIS — L97909 Non-pressure chronic ulcer of unspecified part of unspecified lower leg with unspecified severity: Secondary | ICD-10-CM

## 2017-03-08 HISTORY — PX: LOWER EXTREMITY ANGIOGRAPHY: CATH118251

## 2017-03-08 LAB — GLUCOSE, CAPILLARY
GLUCOSE-CAPILLARY: 75 mg/dL (ref 65–99)
Glucose-Capillary: 81 mg/dL (ref 65–99)

## 2017-03-08 SURGERY — LOWER EXTREMITY ANGIOGRAPHY
Anesthesia: Moderate Sedation | Laterality: Left

## 2017-03-08 MED ORDER — MIDAZOLAM HCL 5 MG/5ML IJ SOLN
INTRAMUSCULAR | Status: AC
  Filled 2017-03-08: qty 5

## 2017-03-08 MED ORDER — CLOPIDOGREL BISULFATE 75 MG PO TABS: 75.0000 mg | ORAL_TABLET | Freq: Every day | ORAL | Status: DC

## 2017-03-08 MED ORDER — FENTANYL CITRATE (PF) 100 MCG/2ML IJ SOLN
INTRAMUSCULAR | Status: DC | PRN
  Administered 2017-03-08 (×4): 50 ug via INTRAVENOUS

## 2017-03-08 MED ORDER — ONDANSETRON HCL 4 MG/2ML IJ SOLN: 4.0000 mg | Freq: Four times a day (QID) | INTRAMUSCULAR | Status: DC | PRN

## 2017-03-08 MED ORDER — METHYLPREDNISOLONE SODIUM SUCC 125 MG IJ SOLR: 125.0000 mg | INTRAMUSCULAR | Status: DC | PRN

## 2017-03-08 MED ORDER — FENTANYL CITRATE (PF) 100 MCG/2ML IJ SOLN
INTRAMUSCULAR | Status: AC
  Filled 2017-03-08: qty 2

## 2017-03-08 MED ORDER — SODIUM CHLORIDE 0.9% FLUSH: 3.0000 mL | Freq: Two times a day (BID) | INTRAVENOUS | Status: DC

## 2017-03-08 MED ORDER — HYDRALAZINE HCL 20 MG/ML IJ SOLN: 5.0000 mg | INTRAMUSCULAR | Status: DC | PRN

## 2017-03-08 MED ORDER — IOPAMIDOL (ISOVUE-300) INJECTION 61%
INTRAVENOUS | Status: DC | PRN
  Administered 2017-03-08: 60 mL via INTRA_ARTERIAL

## 2017-03-08 MED ORDER — FAMOTIDINE 20 MG PO TABS: 40.0000 mg | ORAL_TABLET | ORAL | Status: DC | PRN

## 2017-03-08 MED ORDER — HEPARIN SODIUM (PORCINE) 1000 UNIT/ML IJ SOLN
INTRAMUSCULAR | Status: DC | PRN
  Administered 2017-03-08: 5000 [IU] via INTRAVENOUS

## 2017-03-08 MED ORDER — SODIUM CHLORIDE 0.9% FLUSH: 3.0000 mL | INTRAVENOUS | Status: DC | PRN

## 2017-03-08 MED ORDER — SODIUM CHLORIDE 0.9 % IV SOLN: INTRAVENOUS | Status: DC

## 2017-03-08 MED ORDER — MIDAZOLAM HCL 2 MG/2ML IJ SOLN
INTRAMUSCULAR | Status: DC | PRN
  Administered 2017-03-08 (×2): 1 mg via INTRAVENOUS
  Administered 2017-03-08: 2 mg via INTRAVENOUS
  Administered 2017-03-08: 1 mg via INTRAVENOUS

## 2017-03-08 MED ORDER — HEPARIN SODIUM (PORCINE) 1000 UNIT/ML IJ SOLN
INTRAMUSCULAR | Status: AC
  Filled 2017-03-08: qty 1

## 2017-03-08 MED ORDER — HYDROMORPHONE HCL 1 MG/ML IJ SOLN: 1.0000 mg | Freq: Once | INTRAMUSCULAR | Status: DC | PRN

## 2017-03-08 MED ORDER — LIDOCAINE-EPINEPHRINE (PF) 1 %-1:200000 IJ SOLN
INTRAMUSCULAR | Status: AC
  Filled 2017-03-08: qty 30

## 2017-03-08 MED ORDER — HEPARIN (PORCINE) IN NACL 2-0.9 UNIT/ML-% IJ SOLN
INTRAMUSCULAR | Status: AC
  Filled 2017-03-08: qty 1000

## 2017-03-08 MED ORDER — SODIUM CHLORIDE 0.9 % IV SOLN
INTRAVENOUS | Status: DC
  Administered 2017-03-08: 07:00:00 via INTRAVENOUS

## 2017-03-08 MED ORDER — LABETALOL HCL 5 MG/ML IV SOLN: 10.0000 mg | INTRAVENOUS | Status: DC | PRN

## 2017-03-08 MED ORDER — CLOPIDOGREL BISULFATE 75 MG PO TABS: 75.0000 mg | ORAL_TABLET | Freq: Every day | ORAL | 11 refills | Status: DC

## 2017-03-08 MED ORDER — SODIUM CHLORIDE 0.9 % IV SOLN: 250.0000 mL | INTRAVENOUS | Status: DC | PRN

## 2017-03-08 SURGICAL SUPPLY — 16 items
BALLN LUTONIX 5X220X130 (BALLOONS) ×3
BALLN LUTONIX DCB 7X40X130 (BALLOONS) ×3
BALLOON LUTONIX 5X220X130 (BALLOONS) ×1 IMPLANT
BALLOON LUTONIX DCB 7X40X130 (BALLOONS) ×1 IMPLANT
CATH BEACON 5 .038 100 VERT TP (CATHETERS) ×3 IMPLANT
CATH PIG 70CM (CATHETERS) ×3 IMPLANT
DEVICE PRESTO INFLATION (MISCELLANEOUS) ×3 IMPLANT
DEVICE STARCLOSE SE CLOSURE (Vascular Products) ×3 IMPLANT
GLIDEWIRE ADV .035X260CM (WIRE) ×3 IMPLANT
LIFESTENT SOLO 6X200X135 (Permanent Stent) ×3 IMPLANT
PACK ANGIOGRAPHY (CUSTOM PROCEDURE TRAY) ×3 IMPLANT
SHEATH ANL2 6FRX45 HC (SHEATH) ×3 IMPLANT
SHEATH BRITE TIP 5FRX11 (SHEATH) ×3 IMPLANT
SYR MEDRAD MARK V 150ML (SYRINGE) ×3 IMPLANT
TUBING CONTRAST HIGH PRESS 72 (TUBING) ×3 IMPLANT
WIRE J 3MM .035X145CM (WIRE) ×3 IMPLANT

## 2017-03-08 NOTE — Progress Notes (Signed)
Pt. Assisted OOB and ambulated to BR and back to room. Right groin remains without complications at site. Pt. Denies c/o CP,HA, SOB, dizziness, groin pain. DC instructions reviewed with pt. With verbalized understanding. Plavix RX given to pt. Spouse. Stable for DC home.

## 2017-03-08 NOTE — H&P (Signed)
[facility] History & Physical Update  The patient was interviewed and re-examined.  The patient's previous History and Physical has been reviewed and is unchanged.  There is no change in the plan of care. We plan to proceed with the scheduled procedure.  Festus BarrenJason Brittnye Josephs, MD  03/08/2017, 8:04 AM

## 2017-03-08 NOTE — Op Note (Signed)
[facility] Percutaneous Study/Intervention Procedural Note   Date of Surgery: 03/08/2017  Surgeon(s):Keinan Brouillet   Assistants:none  Pre-operative Diagnosis: PAD with ulceration left lower extremity  Post-operative diagnosis: Same  Procedure(s) Performed: 1. Ultrasound guidance for vascular access right femoral artery 2. Catheter placement into left popliteal artery from right femoral approach 3. Aortogram and selective left lower extremity angiogram 4. Percutaneous transluminal angioplasty of left SFA with a 5 mm diameter by 22 cm length Lutonix drug-coated angioplasty balloon 5. Stent placement to the left SFA with a 6 mm diameter by 20 cm length life stent  6.  Percutaneous transluminal angioplasty of the left common iliac artery with 7 mm diameter by 4 cm length Lutonix drug-coated angioplasty balloon 7. StarClose closure device right femoral artery  EBL: 10 cc  Contrast: 60 cc  Fluoro Time: 4.8 minutes  Moderate Conscious Sedation Time: approximately 35 minutes using 5 mg of Versed and 200 Mcg of Fentanyl  Indications: Patient is a 72 y.o.female with nonhealing ulceration of the left foot. The patient has noninvasive study showing reduced left ABI. The patient is brought in for angiography for further evaluation and potential treatment.  Due to the limb threatening nature of the situation, angiogram was performed for attempted limb salvage. Risks and benefits are discussed and informed consent is obtained  Procedure: The patient was identified and appropriate procedural time out was performed. The patient was then placed supine on the table and prepped and draped in the usual sterile fashion.Moderate conscious sedation was administered during a face to face encounter with the patient throughout the procedure with my supervision of the RN administering medicines  and monitoring the patient's vital signs, pulse oximetry, telemetry and mental status throughout from the start of the procedure until the patient was taken to the recovery room. Ultrasound was used to evaluate the right common femoral artery. It was patent . A digital ultrasound image was acquired. A Seldinger needle was used to access the right common femoral artery under direct ultrasound guidance and a permanent image was performed. A 0.035 J wire was advanced without resistance and a 5Fr sheath was placed. Pigtail catheter was placed into the aorta and an AP aortogram was performed. This demonstrated normal renal arteries and normal aorta. The distal aorta was quite calcific with about a 70% stenosis in the proximal left common iliac artery.  The right common iliac artery was calcific but not stenotic.  The vessels then normalized within a couple of centimeters beyond their origin and the external iliac arteries were widely patent. I then crossed the aortic bifurcation and advanced to the left femoral head. Selective left lower extremity angiogram was then performed. This demonstrated a calcific lesion of the common femoral artery and femoral bifurcation that was mild to moderate in the 50% range.  The SFA was then heavily diseased with 2 areas of high-grade stenosis in the proximal to mid segment as well as an occlusion in the mid segment and a near occlusive stenosis in the distal SFA.  The vessel then normalized at Hunter's canal and the popliteal artery was patent.  There was then a posterior tibial artery which was the dominant runoff into the foot without focal stenoses.  The peroneal and anterior tibial arteries were patent to the distal leg and ankle area but would not really continuous into the foot without distal reconstitution.. The patient was systemically heparinized and a 6 [place] Ansell sheath was then placed over the [employer] wire. I then used  a Kumpe catheter and the advantage wire  to navigate through the SFA stenoses and occlusions and confirm intraluminal flow in the popliteal artery just above the knee.  The wire was then replaced and I proceed with treatment.  A 5 mm diameter by 22 cm length Lutonix drug-coated angioplasty balloon was inflated to 22 atm from the proximal SFA to the distal SFA.  Multiple areas of waist were seen which resolved with angioplasty.  Completion angiogram following this showed 2 areas of greater than 50% stenosis as well as a significant dissection in the proximal to mid segment.  I elected to place a stent here.  A 6 mm diameter by 20 cm length life stent was deployed from the proximal to distal SFA and postdilated with a 5 mm balloon with excellent angiographic completion result and less than 10% residual stenosis.  I then pulled the sheath back to the aortic bifurcation.  A 7 mm diameter by 4 cm length Lutonix drug-coated angioplasty balloon was used to treat the proximal left common iliac artery.  It was inflated to 10 atm for 1 minute.  Completion angiogram showed about a 30-40% residual stenosis that did not appear flow-limiting.  We were not in a good orientation to place a stent today, and if this lesion recurs consideration for kissing stents from bilateral femoral approaches would be given.  This may need to be combined with femoral endarterectomies as the left common femoral artery also had at least moderate disease.  The right common femoral artery had calcific disease but it did not appear to be greater than 50%. I elected to terminate the procedure. The sheath was removed and StarClose closure device was deployed in the right femoral artery with excellent hemostatic result. The patient was taken to the recovery room in stable condition having tolerated the procedure well.  Findings:  Aortogram: This demonstrated normal renal arteries and normal aorta. The distal aorta was quite calcific with about a 70% stenosis in the proximal left  common iliac artery.  The right common iliac artery was calcific but not stenotic.  The vessels then normalized within a couple of centimeters beyond their origin and the external iliac arteries were widely patent Left lower Extremity:Calcific lesion of the common femoral artery and femoral bifurcation that was mild to moderate in the 50% range.  The SFA was then heavily diseased with 2 areas of high-grade stenosis in the proximal to mid segment as well as an occlusion in the mid segment and a near occlusive stenosis in the distal SFA.  The vessel then normalized at Hunter's canal and the popliteal artery was patent.  There was then a posterior tibial artery which was the dominant runoff into the foot without focal stenoses.  The peroneal and anterior tibial arteries were patent to the distal leg and ankle area but would not really continuous into the foot without distal reconstitution.   Disposition: Patient was taken to the recovery room in stable condition having tolerated the procedure well.  Complications: None  Leotis Pain 03/08/2017 9:12 AM   This note was created with Dragon Medical transcription system. Any errors in dictation are purely unintentional.

## 2017-03-08 NOTE — Progress Notes (Signed)
Dr. Wyn Quakerew at bedside speaking with pt. Spouse re: results. Pt. Remains sleepy; awakens to voice. Spouse verbalized understanding of conversation.

## 2017-03-10 NOTE — Progress Notes (Signed)
[facility] - Clinic Note  03/13/2017     CHIEF COMPLAINT Patient presents for Retina Follow Up   HISTORY OF PRESENT ILLNESS: Cheryl Leonard is a 72 y.o. female who presents to the clinic today for:   HPI    Retina Follow Up    In both eyes.  Severity is moderate.  Duration of 4 weeks.  Since onset it is stable.  I, the attending physician,  performed the HPI with the patient and updated documentation appropriately.          Comments    Pt presents today for F/U for DME, pt states her vision is stable since last visit, states she is seeing floaters and twice has seen a "purple snowflake" OD, pt denies flashes, wavy vision or pain, pts BS was 96 this morning, pt denies the use of gtts, pt has vascular stent put in on 03/08/17       Last edited by Rennis ChrisZamora, Octavion Mollenkopf, MD on 03/13/2017  2:45 PM. (History)    Pt states she feels injection "helped a lot";   Referring physician: Danella PentonMiller, Mark F, MD [phone] [facility] [place], [place] [id number]  HISTORICAL INFORMATION:   Selected notes from the MEDICAL RECORD NUMBER Referred by Dr. Senaida Oresichardson for concern of DME OD;  LEE- 02.05.19 [BCVA OD: 20/70-1 OS: 20/30-1] Ocular Hx-  PMH- DM Lab Results  Component Value Date   HGBA1C 5.9 (H) 10/29/2015       CURRENT MEDICATIONS: No current outpatient medications on file. (Ophthalmic Drugs)   No current facility-administered medications for this visit.  (Ophthalmic Drugs)   Current Outpatient Medications (Other)  Medication Sig  . ALPRAZolam (XANAX) 0.25 MG tablet Take 0.25 mg by mouth at bedtime as needed for sleep.   [name]. aspirin EC 81 MG tablet Take 81 mg by mouth daily.   [name]. atorvastatin (LIPITOR) 20 MG tablet Take 20 mg by mouth at bedtime.  [name]. buPROPion (WELLBUTRIN XL) 150 MG 24 hr tablet Take 150 mg by mouth daily. Take with 300 mg to equal 450 mg once daily  . buPROPion (WELLBUTRIN XL) 300 MG 24 hr tablet Take 300 mg by mouth  daily. Take with 150 mg to equal 450 mg once daily  . BYSTOLIC 5 MG tablet Take 5 mg by mouth daily.  . cholecalciferol (VITAMIN D) 1000 units tablet Take 1,000 Units by mouth 2 (two) times daily.  [name]. CINNAMON PO Take 1 tablet by mouth daily.  . clopidogrel (PLAVIX) 75 MG tablet Take 1 tablet (75 mg total) by mouth daily.  [name]. CORAL CALCIUM PO Take 1 tablet by mouth daily.  . DULoxetine (CYMBALTA) 60 MG capsule Take 60 mg by mouth 2 (two) times daily.   [name]. gemfibrozil (LOPID) 600 MG tablet Take 600 mg by mouth 2 (two) times daily.  . Insulin Pen Needle (B-D UF III MINI PEN NEEDLES) 31G X 5 MM MISC once daily.  . Iron-Vitamin C (VITRON-C) 65-125 MG TABS Take 1 tablet by mouth daily.   [name]. JANUMET 50-1000 MG per tablet Take 1 tablet by mouth 2 (two) times daily with a meal.  . levothyroxine (SYNTHROID, LEVOTHROID) 100 MCG tablet Take 100 mcg by mouth daily.  [name]. lisinopril (PRINIVIL,ZESTRIL) 10 MG tablet Take 10 mg by mouth daily.   . Magnesium 500 MG TABS Take 500 mg by mouth daily.  . Multiple Vitamin (MULTIVITAMIN WITH MINERALS) TABS tablet Take 1 tablet by mouth daily.  . Omega-3 Fatty  Acids (FISH OIL) 1000 MG CAPS Take 1,000 mg by mouth daily.  . pantoprazole (PROTONIX) 40 MG tablet Take 40 mg by mouth daily.   . ranitidine (ZANTAC) 300 MG capsule Take 300 mg by mouth at bedtime.  . TRESIBA FLEXTOUCH 200 UNIT/ML SOPN INJECT 30 UNITS SUB-Q NIGHTLY  . vitamin B-12 (CYANOCOBALAMIN) 1000 MCG tablet Take 1,000 mcg by mouth daily.  . vitamin E 400 UNIT capsule Take 400 Units by mouth daily.  [name] zolpidem (AMBIEN) 10 MG tablet Take 10 mg by mouth at bedtime.   Current Facility-Administered Medications (Other)  Medication Route  . Bevacizumab (AVASTIN) SOLN 1.25 mg Intravitreal  . Bevacizumab (AVASTIN) SOLN 1.25 mg Intravitreal      REVIEW OF SYSTEMS: ROS    Positive for: Musculoskeletal, Endocrine, Cardiovascular, Eyes, Psychiatric   Negative for: Constitutional, Gastrointestinal, Neurological,  Skin, Genitourinary, HENT, Respiratory, Allergic/Imm, Heme/Lymph   Last edited by Posey Boyer, COT on 03/13/2017  2:28 PM. (History)       ALLERGIES No Known Allergies  PAST MEDICAL HISTORY Past Medical History:  Diagnosis Date  . Anxiety   . Arthritis   . Cataracts, both eyes   . GERD (gastroesophageal reflux disease)   . Gout   . History of fracture of patella    right knee  . History of positive PPD    Patient always shows positive  . Hyperlipidemia   . Hypertension   . Hypothyroidism   . Lichen sclerosus 12/30/2013   of vulva  . Metatarsal fracture   . Neuropathy   . Peripheral vascular disease (HCC)   . Polyneuropathy    numbness and tingling in feet and toes  . Type 2 diabetes mellitus, uncontrolled (HCC)    Past Surgical History:  Procedure Laterality Date  . APPENDECTOMY    . BREAST REDUCTION SURGERY  2001  . CATARACT EXTRACTION    . CESAREAN SECTION  1976  . COLONOSCOPY  03/05/2013   Nml - due for repeat 03/06/2018  . DILATION AND CURETTAGE OF UTERUS  1989  . ESOPHAGOGASTRODUODENOSCOPY  03/05/2013  . EYE SURGERY    . Eyelid Surgery  2012  . INTRAMEDULLARY (IM) NAIL INTERTROCHANTERIC Left 10/30/2015   Procedure: INTRAMEDULLARY (IM) NAIL INTERTROCHANTRIC ;  Surgeon: Kennedy Bucker, MD;  Location: [facility] ORS;  Service: Orthopedics;  Laterality: Left;  . LAPAROSCOPIC HYSTERECTOMY  2000   total  . LOWER EXTREMITY ANGIOGRAPHY Left 03/08/2017   Procedure: LOWER EXTREMITY ANGIOGRAPHY;  Surgeon: Annice Needy, MD;  Location: [facility] INVASIVE CV LAB;  Service: Cardiovascular;  Laterality: Left;  . REDUCTION MAMMAPLASTY  1997    FAMILY HISTORY Family History  Problem Relation Age of Onset  . Coronary artery disease Father   . Heart attack Father   . Coronary artery disease Mother   . Heart attack Mother   . Ovarian cancer Sister 23       sister had hormonal therapy for IVF txs-which increased risk factor for ovarian cancer  . Breast cancer Neg Hx     SOCIAL  HISTORY Social History   Tobacco Use  . Smoking status: Former Smoker    Packs/day: 1.00    Years: 20.00    Pack years: 20.00    Types: Cigarettes    Last attempt to quit: 03/07/1996    Years since quitting: 21.0  . Smokeless tobacco: Never Used  . Tobacco comment: started smoking at age 19  Substance Use Topics  . Alcohol use: No    Alcohol/week: 0.0 oz  .  Drug use: No         OPHTHALMIC EXAM:  Base Eye Exam    Visual Acuity (Snellen - Linear)      Right Left   Dist [place] 20/60 -1 20/25 +2   Dist ph [place] 20/30 NI       Tonometry (Tonopen, 2:34 PM)      Right Left   Pressure 17 17       Pupils      Dark Light Shape React APD   Right 3 2 Round Brisk None   Left 3 2 Round Brisk None       Visual Fields (Counting fingers)      Left Right    Full Full       Extraocular Movement      Right Left    Full, Ortho Full, Ortho       Neuro/Psych    Oriented x3:  Yes   Mood/Affect:  Normal       Dilation    Both eyes:  1.0% Mydriacyl, 2.5% Phenylephrine @ 2:34 PM        Slit Lamp and Fundus Exam    External Exam      Right Left   External Normal Normal       Slit Lamp Exam      Right Left   Lids/Lashes dermatochalasis dermatochalasis   Conjunctiva/Sclera White and quiet White and quiet   Cornea arcus; well healed cataract wound; 2+ PEE arcus; well healed cataract wound; 2-3+ PEE   Anterior Chamber deep deep   Iris round; mod dilated round; mod dilated   Lens PCIOL; open PC PCIOL; open PC   Vitreous syneresis, Posterior vitreous detachment syneresis, Posterior vitreous detachment       Fundus Exam      Right Left   Disc mild flame hemes superiorly Normal   C/D Ratio 0.4 0.5   Macula Blunted foveal reflex, Cystic changes - improved from prior, scattered Microaneurysms  flat; no heme or edema   Vessels mildly tortuous; dilated superior arcade venules; mild CWS along sup temp arcade; AV crossing changes mild tortuosity; AV crossing changes   Periphery  attached; scattered dot hemes attached          IMAGING AND PROCEDURES  Imaging and Procedures for 03/13/17  OCT, Retina - OU - Both Eyes     Right Eye Quality was good. Central Foveal Thickness: 388. Progression has improved. Findings include abnormal foveal contour, epiretinal membrane, intraretinal fluid, no SRF.   Left Eye Quality was good. Central Foveal Thickness: 282. Progression has been stable. Findings include normal foveal contour, no IRF, no SRF.   Notes *Images captured and stored on drive  Diagnosis / Impression:  CME OD -- interval improvement -- very good initial response NFP; no IRF/SRF OS  Clinical management:  See below  Abbreviations: NFP - Normal foveal profile. CME - cystoid macular edema. PED - pigment epithelial detachment. IRF - intraretinal fluid. SRF - subretinal fluid. EZ - ellipsoid zone. ERM - epiretinal membrane. ORA - outer retinal atrophy. ORT - outer retinal tubulation. SRHM - subretinal hyper-reflective material        Intravitreal Injection, Pharmacologic Agent - OD - Right Eye     Time Out 03/13/2017. 3:12 PM. Confirmed correct patient, procedure, site, and patient consented.   Anesthesia Topical anesthesia was used. Anesthetic medications included Lidocaine 2%, Tetracaine 0.5%.   Procedure Preparation included 5% betadine to ocular surface, eyelid speculum. A supplied needle was used.  Injection: 1.25 mg Bevacizumab 1.25mg /0.29ml   NDC: 16109-604-54    Lot: [id number]     Expiration Date: 04/16/2017   Route: Intravitreal   Site: Right Eye   Waste: 0 mg  Post-op Post injection exam found visual acuity of at least counting fingers. The patient tolerated the procedure well. There were no complications. The patient received written and verbal post procedure care education.                 ASSESSMENT/PLAN:    ICD-10-CM   1. Branch retinal vein occlusion of right eye with macular edema H34.8310 Intravitreal Injection,  Pharmacologic Agent - OD - Right Eye    Bevacizumab (AVASTIN) SOLN 1.25 mg  2. Retinal edema H35.81 OCT, Retina - OU - Both Eyes  3. 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 Essential hypertension I10   5. Hypertensive retinopathy of both eyes H35.033   6. Epiretinal membrane (ERM) of right eye H35.371   7. Pseudophakia of both eyes Z96.1     1,2. CME OD -- suspect remote BRVO OD - by history, pt states symptoms first noticed 2 wks prior to presentation, but reports changes may have occurred prior - exam with differential tortuosity of vessels (OD > OS) - FA on 2.8.19 (initial visit) with petaloid leakage and hyperfluorescence of the disc - S/P IVA OD #1 (02.08.19) - OCT today with central CME OD vastly improved - differential includes DM2 (DME), hypertensive retinopathy, inflammatory etiology / uveitis - BCVA 20/30 OD today - recommend IVA OD #2 today, (03.11.19) - RBA of procedure discussed, questions answered - informed consent obtained and signed - see procedure note - F/U 4 weeks -- DFE/OCT/possible injection  3. Mild nonproliferative diabetic retinopathy, both eyes - The incidence, risk factors for progression, natural history and treatment options for diabetic retinopathy were discussed with patient.   - The need for close monitoring of blood glucose, blood pressure, and serum lipids, avoiding cigarette or any type of tobacco, and the need for long term follow up was also discussed with patient. - could be contributing to CME OD - however OS with minimal diabetic retinopathy - will continue to monitor  4,5. Hypertensive retinopathy OU - as above, may be contributing to CME OD - discussed importance of tight BP control - monitor  6. Epiretinal membrane, right eye  The natural history, anatomy, potential for loss of vision, and treatment options including vitrectomy  techniques and the complications of endophthalmitis, retinal detachment, vitreous hemorrhage, cataract progression and permanent vision loss discussed with the patient.  7. Pseudophakia OU  - s/p CE/IOL OU by [facility] in [facility]  - doing well  - monitor  Ophthalmic Meds Ordered this visit:  Meds ordered this encounter  Medications  . Bevacizumab (AVASTIN) SOLN 1.25 mg       Return in about 4 weeks (around 04/10/2017) for F/U CME OD.  There are no Patient Instructions on file for this visit.   Explained the diagnoses, plan, and follow up with the patient and they expressed understanding.  Patient expressed understanding of the importance of proper follow up care.   This document serves as a record of services personally performed by Karie Chimera, MD, PhD. It was created on their behalf by Virgilio Belling, COA, a certified ophthalmic assistant. The creation of this record is the provider's dictation and/or activities during the visit.  Electronically signed by: Virgilio Belling,  COA  03/13/17 5:18 PM   Karie Chimera, M.D., Ph.D. Diseases & Surgery of the Retina and Vitreous [facility] 03/13/17  I have reviewed the above documentation for accuracy and completeness, and I agree with the above. Karie Chimera, M.D., Ph.D. 03/13/17 5:18 PM    Abbreviations: M myopia (nearsighted); A astigmatism; H hyperopia (farsighted); P presbyopia; Mrx spectacle prescription;  CTL contact lenses; OD right eye; OS left eye; OU both eyes  XT exotropia; ET esotropia; PEK punctate epithelial keratitis; PEE punctate epithelial erosions; DES dry eye syndrome; MGD meibomian gland dysfunction; ATs artificial tears; PFAT's preservative free artificial tears; NSC nuclear sclerotic cataract; PSC posterior subcapsular cataract; ERM epi-retinal membrane; PVD posterior vitreous detachment; RD retinal detachment; DM diabetes mellitus; DR diabetic retinopathy; NPDR non-proliferative  diabetic retinopathy; PDR proliferative diabetic retinopathy; CSME clinically significant macular edema; DME diabetic macular edema; dbh dot blot hemorrhages; CWS cotton wool spot; POAG primary open angle glaucoma; C/D cup-to-disc ratio; HVF humphrey visual field; GVF goldmann visual field; OCT optical coherence tomography; IOP intraocular pressure; BRVO Branch retinal vein occlusion; CRVO central retinal vein occlusion; CRAO central retinal artery occlusion; BRAO branch retinal artery occlusion; RT retinal tear; SB scleral buckle; PPV pars plana vitrectomy; VH Vitreous hemorrhage; PRP panretinal laser photocoagulation; IVK intravitreal kenalog; VMT vitreomacular traction; MH Macular hole;  NVD neovascularization of the disc; NVE neovascularization elsewhere; AREDS age related eye disease study; ARMD age related macular degeneration; POAG primary open angle glaucoma; EBMD epithelial/anterior basement membrane dystrophy; ACIOL anterior chamber intraocular lens; IOL intraocular lens; PCIOL posterior chamber intraocular lens; Phaco/IOL phacoemulsification with intraocular lens placement; PRK photorefractive keratectomy; LASIK laser assisted in situ keratomileusis; HTN hypertension; DM diabetes mellitus; COPD chronic obstructive pulmonary disease

## 2017-03-13 ENCOUNTER — Ambulatory Visit (INDEPENDENT_AMBULATORY_CARE_PROVIDER_SITE_OTHER): Payer: Medicare Other | Admitting: Ophthalmology

## 2017-03-13 ENCOUNTER — Encounter (INDEPENDENT_AMBULATORY_CARE_PROVIDER_SITE_OTHER): Payer: Self-pay | Admitting: Ophthalmology

## 2017-03-13 DIAGNOSIS — H35371 Puckering of macula, right eye: Secondary | ICD-10-CM

## 2017-03-13 DIAGNOSIS — H35033 Hypertensive retinopathy, bilateral: Secondary | ICD-10-CM

## 2017-03-13 DIAGNOSIS — H34831 Tributary (branch) retinal vein occlusion, right eye, with macular edema: Secondary | ICD-10-CM

## 2017-03-13 DIAGNOSIS — I10 Essential (primary) hypertension: Secondary | ICD-10-CM

## 2017-03-13 DIAGNOSIS — E113213 Type 2 diabetes mellitus with mild nonproliferative diabetic retinopathy with macular edema, bilateral: Secondary | ICD-10-CM | POA: Diagnosis not present

## 2017-03-13 DIAGNOSIS — H3581 Retinal edema: Secondary | ICD-10-CM

## 2017-03-13 DIAGNOSIS — Z961 Presence of intraocular lens: Secondary | ICD-10-CM | POA: Diagnosis not present

## 2017-03-13 MED ORDER — BEVACIZUMAB CHEMO INJECTION 1.25MG/0.05ML SYRINGE FOR KALEIDOSCOPE
1.2500 mg | INTRAVITREAL | Status: DC
  Administered 2017-03-13: 1.25 mg via INTRAVITREAL

## 2017-04-07 ENCOUNTER — Other Ambulatory Visit (INDEPENDENT_AMBULATORY_CARE_PROVIDER_SITE_OTHER): Payer: Self-pay | Admitting: Vascular Surgery

## 2017-04-07 DIAGNOSIS — I739 Peripheral vascular disease, unspecified: Secondary | ICD-10-CM

## 2017-04-10 ENCOUNTER — Ambulatory Visit (INDEPENDENT_AMBULATORY_CARE_PROVIDER_SITE_OTHER): Payer: Medicare Other

## 2017-04-10 ENCOUNTER — Encounter (INDEPENDENT_AMBULATORY_CARE_PROVIDER_SITE_OTHER): Payer: Self-pay | Admitting: Vascular Surgery

## 2017-04-10 ENCOUNTER — Ambulatory Visit (INDEPENDENT_AMBULATORY_CARE_PROVIDER_SITE_OTHER): Payer: Medicare Other | Admitting: Vascular Surgery

## 2017-04-10 VITALS — BP 120/74 | HR 81 | Resp 14 | Ht 65.0 in | Wt 165.0 lb

## 2017-04-10 DIAGNOSIS — E1165 Type 2 diabetes mellitus with hyperglycemia: Secondary | ICD-10-CM | POA: Diagnosis not present

## 2017-04-10 DIAGNOSIS — L97909 Non-pressure chronic ulcer of unspecified part of unspecified lower leg with unspecified severity: Secondary | ICD-10-CM | POA: Diagnosis not present

## 2017-04-10 DIAGNOSIS — I739 Peripheral vascular disease, unspecified: Secondary | ICD-10-CM

## 2017-04-10 DIAGNOSIS — I70299 Other atherosclerosis of native arteries of extremities, unspecified extremity: Secondary | ICD-10-CM

## 2017-04-10 DIAGNOSIS — E785 Hyperlipidemia, unspecified: Secondary | ICD-10-CM | POA: Diagnosis not present

## 2017-04-10 NOTE — Progress Notes (Signed)
[facility] - Clinic Note  04/11/2017     CHIEF COMPLAINT Patient presents for Retina Follow Up   HISTORY OF PRESENT ILLNESS: Cheryl Leonard is a 72 y.o. female who presents to the clinic today for:   HPI    Retina Follow Up    Patient presents with  Other.  In right eye.  This started 2 months ago.  Severity is moderate.  Duration of 2 months.  Since onset it is stable.  I, the attending physician,  performed the HPI with the patient and updated documentation appropriately.          Comments    72 y/o female pt here for 4 wk f/u for CME OD.  Vision about the same OU, but she is no longer seeing the floaters.  Denies pain, flashes.  AT prn OU.       Last edited by [name], MD on 04/11/2017  2:50 PM. (History)      Referring physician: Danella Penton, MD [phone] [facility] [address] [facility] [place], [place] [address]  HISTORICAL INFORMATION:   Selected notes from the MEDICAL RECORD NUMBER Referred by Dr. Senaida Ores for concern of DME OD;  LEE- 02.05.19 [BCVA OD: 20/70-1 OS: 20/30-1] Ocular Hx-  PMH- DM Lab Results  Component Value Date   HGBA1C 5.9 (H) 10/29/2015       CURRENT MEDICATIONS: No current outpatient medications on file. (Ophthalmic Drugs)   No current facility-administered medications for this visit.  (Ophthalmic Drugs)   Current Outpatient Medications (Other)  Medication Sig  . ALPRAZolam (XANAX) 0.25 MG tablet Take 0.25 mg by mouth at bedtime as needed for sleep.   [name] aspirin EC 81 MG tablet Take 81 mg by mouth daily.   [name] atorvastatin (LIPITOR) 20 MG tablet Take 20 mg by mouth at bedtime.  [name] buPROPion (WELLBUTRIN XL) 150 MG 24 hr tablet Take 150 mg by mouth daily. Take with 300 mg to equal 450 mg once daily  . buPROPion (WELLBUTRIN XL) 300 MG 24 hr tablet Take 300 mg by mouth daily. Take with 150 mg to equal 450 mg once daily  . BYSTOLIC 5 MG tablet Take 5 mg by mouth daily.  . cholecalciferol (VITAMIN D)  1000 units tablet Take 1,000 Units by mouth 2 (two) times daily.  [name] CINNAMON PO Take 1 tablet by mouth daily.  . clopidogrel (PLAVIX) 75 MG tablet Take 1 tablet (75 mg total) by mouth daily.  [name] CORAL CALCIUM PO Take 1 tablet by mouth daily.  [name] denosumab (PROLIA) 60 MG/ML SOLN injection Inject into the skin.  . DULoxetine (CYMBALTA) 60 MG capsule Take 60 mg by mouth 2 (two) times daily.   [name] gemfibrozil (LOPID) 600 MG tablet Take 600 mg by mouth 2 (two) times daily.  . Insulin Pen Needle (B-D UF III MINI PEN NEEDLES) 31G X 5 MM MISC once daily.  . Iron-Vitamin C (VITRON-C) 65-125 MG TABS Take 1 tablet by mouth daily.   [name] JANUMET 50-1000 MG per tablet Take 1 tablet by mouth 2 (two) times daily with a meal.  . levothyroxine (SYNTHROID, LEVOTHROID) 100 MCG tablet Take 100 mcg by mouth daily.  [name] lisinopril (PRINIVIL,ZESTRIL) 10 MG tablet Take 10 mg by mouth daily.   . Magnesium 500 MG TABS Take 500 mg by mouth daily.  . Multiple Vitamin (MULTIVITAMIN WITH MINERALS) TABS tablet Take 1 tablet by mouth daily.  . Omega-3 Fatty Acids (FISH OIL) 1000 MG CAPS  Take 1,000 mg by mouth daily.  . pantoprazole (PROTONIX) 40 MG tablet Take 40 mg by mouth daily.   . ranitidine (ZANTAC) 300 MG capsule Take 300 mg by mouth at bedtime.  . TRESIBA FLEXTOUCH 200 UNIT/ML SOPN INJECT 30 UNITS SUB-Q NIGHTLY  . vitamin B-12 (CYANOCOBALAMIN) 1000 MCG tablet Take 1,000 mcg by mouth daily.  . vitamin E 400 UNIT capsule Take 400 Units by mouth daily.  [name] zolpidem (AMBIEN) 10 MG tablet Take 10 mg by mouth at bedtime.   Current Facility-Administered Medications (Other)  Medication Route  . Bevacizumab (AVASTIN) SOLN 1.25 mg Intravitreal  . Bevacizumab (AVASTIN) SOLN 1.25 mg Intravitreal  . Bevacizumab (AVASTIN) SOLN 1.25 mg Intravitreal      REVIEW OF SYSTEMS: ROS    Positive for: Endocrine, Eyes   Negative for: Constitutional, Gastrointestinal, Neurological, Skin, Genitourinary, Musculoskeletal, HENT, Cardiovascular,  Respiratory, Psychiatric, Allergic/Imm, Heme/Lymph   Last edited by Celine Mans, COA on 04/11/2017  2:35 PM. (History)       ALLERGIES No Known Allergies  PAST MEDICAL HISTORY Past Medical History:  Diagnosis Date  . Anxiety   . Arthritis   . Cataracts, both eyes   . GERD (gastroesophageal reflux disease)   . Gout   . History of fracture of patella    right knee  . History of positive PPD    Patient always shows positive  . Hyperlipidemia   . Hypertension   . Hypothyroidism   . Lichen sclerosus 12/30/2013   of vulva  . Metatarsal fracture   . Neuropathy   . Peripheral vascular disease (HCC)   . Polyneuropathy    numbness and tingling in feet and toes  . Type 2 diabetes mellitus, uncontrolled (HCC)    Past Surgical History:  Procedure Laterality Date  . APPENDECTOMY    . BREAST REDUCTION SURGERY  2001  . CATARACT EXTRACTION    . CESAREAN SECTION  1976  . COLONOSCOPY  03/05/2013   Nml - due for repeat 03/06/2018  . DILATION AND CURETTAGE OF UTERUS  1989  . ESOPHAGOGASTRODUODENOSCOPY  03/05/2013  . EYE SURGERY    . Eyelid Surgery  2012  . INTRAMEDULLARY (IM) NAIL INTERTROCHANTERIC Left 10/30/2015   Procedure: INTRAMEDULLARY (IM) NAIL INTERTROCHANTRIC ;  Surgeon: Kennedy Bucker, MD;  Location: [facility] ORS;  Service: Orthopedics;  Laterality: Left;  . LAPAROSCOPIC HYSTERECTOMY  2000   total  . LOWER EXTREMITY ANGIOGRAPHY Left 03/08/2017   Procedure: LOWER EXTREMITY ANGIOGRAPHY;  Surgeon: Annice Needy, MD;  Location: [facility] INVASIVE CV LAB;  Service: Cardiovascular;  Laterality: Left;  . REDUCTION MAMMAPLASTY  1997    FAMILY HISTORY Family History  Problem Relation Age of Onset  . Coronary artery disease Father   . Heart attack Father   . Coronary artery disease Mother   . Heart attack Mother   . Ovarian cancer Sister 75       sister had hormonal therapy for IVF txs-which increased risk factor for ovarian cancer  . Breast cancer Neg Hx     SOCIAL HISTORY Social  History   Tobacco Use  . Smoking status: Former Smoker    Packs/day: 1.00    Years: 20.00    Pack years: 20.00    Types: Cigarettes    Last attempt to quit: 03/07/1996    Years since quitting: 21.1  . Smokeless tobacco: Never Used  . Tobacco comment: started smoking at age 25  Substance Use Topics  . Alcohol use: No    Alcohol/week: 0.0  oz  . Drug use: No         OPHTHALMIC EXAM:  Base Eye Exam    Visual Acuity (Snellen - Linear)      Right Left   Dist [place] 20/60 20/20 -1   Dist ph [place] 20/40 -2        Tonometry (Tonopen, 2:32 PM)      Right Left   Pressure 17 15       Pupils      Dark Light Shape React APD   Right 3 2 Round Brisk None   Left 3 2 Round Brisk None       Visual Fields (Counting fingers)      Left Right    Full Full       Extraocular Movement      Right Left    Full Full       Neuro/Psych    Oriented x3:  Yes   Mood/Affect:  Normal       Dilation    Both eyes:  1.0% Mydriacyl, 2.5% Phenylephrine @ 2:32 PM        Slit Lamp and Fundus Exam    External Exam      Right Left   External Normal Normal       Slit Lamp Exam      Right Left   Lids/Lashes dermatochalasis dermatochalasis   Conjunctiva/Sclera White and quiet White and quiet   Cornea arcus; well healed cataract wound; ,2- 3+ Punctate epithelial erosions, irregular epi surface arcus; well healed cataract wound, 2-3+ Punctate epithelial erosions, irregualr epi surface    Anterior Chamber deep deep   Iris round; mod dilated round; mod dilated   Lens PCIOL; open PC PCIOL; open PC   Vitreous syneresis, Posterior vitreous detachment syneresis, Posterior vitreous detachment       Fundus Exam      Right Left   Disc mild flame hemes superiorly Normal   C/D Ratio 0.4 0.5   Macula Blunted foveal reflex, Cystic changes - improved from prior, scattered Microaneurysms  flat; no heme or edema   Vessels mildly tortuous; dilated superior arcade venules; mild CWS along sup temp arcade; AV  crossing changes mild tortuosity; AV crossing changes   Periphery attached; scattered dot hemes temporally attached          IMAGING AND PROCEDURES  Imaging and Procedures for 04/11/17  OCT, Retina - OU - Both Eyes       Right Eye Quality was good. Central Foveal Thickness: 371. Progression has improved. Findings include abnormal foveal contour, epiretinal membrane, intraretinal fluid, no SRF.   Left Eye Quality was good. Central Foveal Thickness: 283. Progression has been stable. Findings include normal foveal contour, no IRF, no SRF.   Notes *Images captured and stored on drive  Diagnosis / Impression:  CME OD -- mild interval improvement NFP; no IRF/SRF OS  Clinical management:  See below  Abbreviations: NFP - Normal foveal profile. CME - cystoid macular edema. PED - pigment epithelial detachment. IRF - intraretinal fluid. SRF - subretinal fluid. EZ - ellipsoid zone. ERM - epiretinal membrane. ORA - outer retinal atrophy. ORT - outer retinal tubulation. SRHM - subretinal hyper-reflective material        Intravitreal Injection, Pharmacologic Agent - OD - Right Eye       Time Out 04/11/2017. 3:05 PM. Confirmed correct patient, procedure, site, and patient consented.   Anesthesia Topical anesthesia was used. Anesthetic medications included Lidocaine 2%, Tetracaine 0.5%.   Procedure  Preparation included 5% betadine to ocular surface, eyelid speculum. A supplied needle was used.   Injection: 1.25 mg Bevacizumab 1.25mg /0.88ml   NDC: 09811-914-78    Lot: [id number]     Expiration Date: 05/08/2017   Route: Intravitreal   Site: Right Eye   Waste: 0 mg  Post-op Post injection exam found visual acuity of at least counting fingers. The patient tolerated the procedure well. There were no complications. The patient received written and verbal post procedure care education.                 ASSESSMENT/PLAN:    ICD-10-CM   1. Branch retinal vein occlusion of  right eye with macular edema H34.8310 Intravitreal Injection, Pharmacologic Agent - OD - Right Eye    Bevacizumab (AVASTIN) SOLN 1.25 mg  2. Retinal edema H35.81 OCT, Retina - OU - Both Eyes  3. 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 Essential hypertension I10   5. Hypertensive retinopathy of both eyes H35.033   6. Epiretinal membrane (ERM) of right eye H35.371   7. Pseudophakia of both eyes Z96.1     1,2. CME OD -- likely remote BRVO OD - by history, pt states symptoms first noticed 2 wks prior to presentation, but reports changes may have occurred prior - initial exam with differential tortuosity of vessels (OD > OS) - FA on 2.8.19 (initial visit) with petaloid leakage and hyperfluorescence of the disc - differential includes DM2 (DME), hypertensive retinopathy, inflammatory etiology / uveitis - S/P IVA OD #1 (02.08.19), #2 (03.11.19) - OCT today with central CME OD persistent but improved - BCVA 20/40 OD today - recommend IVA OD #3 today, (04.09.19) - RBA of procedure discussed, questions answered - informed consent obtained and signed - see procedure note - F/U 4 weeks -- DFE/OCT/possible injection  3. Mild nonproliferative diabetic retinopathy, both eyes - The incidence, risk factors for progression, natural history and treatment options for diabetic retinopathy were discussed with patient.   - The need for close monitoring of blood glucose, blood pressure, and serum lipids, avoiding cigarette or any type of tobacco, and the need for long term follow up was also discussed with patient. - could be contributing to CME OD - however OS with minimal diabetic retinopathy -  Will continue to monitor  4,5. Hypertensive retinopathy OU - stable - as above, may be contributing to CME OD - discussed importance of tight BP control - monitor  6. Epiretinal membrane, right eye  The  natural history, anatomy, potential for loss of vision, and treatment options including vitrectomy techniques and the complications of endophthalmitis, retinal detachment, vitreous hemorrhage, cataract progression and permanent vision loss discussed with the patient. - stable - no indication for surgery at this time  7. Pseudophakia OU  - s/p CE/IOL OU by cataract surgeon in [facility]  - doing well  - monitor  Ophthalmic Meds Ordered this visit:  Meds ordered this encounter  Medications  . Bevacizumab (AVASTIN) SOLN 1.25 mg       Return in about 1 month (around 05/09/2017) for F/U CME OD, Dilated exam, OCT.  There are no Patient Instructions on file for this visit.   Explained the diagnoses, plan, and follow up with the patient and they expressed understanding.  Patient expressed understanding of the importance of proper follow up care.   This document serves as a record of services personally performed by Karie Chimera,  MD, PhD. It was created on their behalf by Virgilio BellingMeredith Fabian, COA, a certified ophthalmic assistant. The creation of this record is the provider's dictation and/or activities during the visit.  Electronically signed by: Virgilio BellingMeredith Fabian, COA  04/11/17 4:26 PM   Karie ChimeraBrian G. Kelise Kuch, M.D., Ph.D. Diseases & Surgery of the Retina and Vitreous [facility] 04/11/17  I have reviewed the above documentation for accuracy and completeness, and I agree with the above. Karie ChimeraBrian G. Daylin Eads, M.D., Ph.D. 04/11/17 4:26 PM    Abbreviations: M myopia (nearsighted); A astigmatism; H hyperopia (farsighted); P presbyopia; Mrx spectacle prescription;  CTL contact lenses; OD right eye; OS left eye; OU both eyes  XT exotropia; ET esotropia; PEK punctate epithelial keratitis; PEE punctate epithelial erosions; DES dry eye syndrome; MGD meibomian gland dysfunction; ATs artificial tears; PFAT's preservative free artificial tears; NSC nuclear sclerotic cataract; PSC posterior  subcapsular cataract; ERM epi-retinal membrane; PVD posterior vitreous detachment; RD retinal detachment; DM diabetes mellitus; DR diabetic retinopathy; NPDR non-proliferative diabetic retinopathy; PDR proliferative diabetic retinopathy; CSME clinically significant macular edema; DME diabetic macular edema; dbh dot blot hemorrhages; CWS cotton wool spot; POAG primary open angle glaucoma; C/D cup-to-disc ratio; HVF humphrey visual field; GVF goldmann visual field; OCT optical coherence tomography; IOP intraocular pressure; BRVO Branch retinal vein occlusion; CRVO central retinal vein occlusion; CRAO central retinal artery occlusion; BRAO branch retinal artery occlusion; RT retinal tear; SB scleral buckle; PPV pars plana vitrectomy; VH Vitreous hemorrhage; PRP panretinal laser photocoagulation; IVK intravitreal kenalog; VMT vitreomacular traction; MH Macular hole;  NVD neovascularization of the disc; NVE neovascularization elsewhere; AREDS age related eye disease study; ARMD age related macular degeneration; POAG primary open angle glaucoma; EBMD epithelial/anterior basement membrane dystrophy; ACIOL anterior chamber intraocular lens; IOL intraocular lens; PCIOL posterior chamber intraocular lens; Phaco/IOL phacoemulsification with intraocular lens placement; PRK photorefractive keratectomy; LASIK laser assisted in situ keratomileusis; HTN hypertension; DM diabetes mellitus; COPD chronic obstructive pulmonary disease

## 2017-04-10 NOTE — Progress Notes (Signed)
Subjective:    Patient ID: Cheryl Leonard, female    DOB: 11-05-1945, 72 y.o.   MRN: [id number] Chief Complaint  Patient presents with  . Follow-up    1 month ABI follow up   Whenever the patient presents for her first post procedure follow-up.  The patient is status post left lower extremity angiogram with intervention on March 08, 2017.  The patient presents today without complaint.  The patient notes an improvement to the symptoms in her left lower extremity.  The patient denies any claudication-like symptoms, rest pain or ulceration to the bilateral lower extremity.  The patient underwent a bilateral ABI which was notable for right: 1.03 and left: 1.24.  This is an improvement from her previous ABI before her endovascular intervention where her ABI was right: 0.80 and left: 0.79.  The patient has triphasic tibials.  The patient denies any fever, nausea or vomiting.  Review of Systems  Constitutional: Negative.   HENT: Negative.   Eyes: Negative.   Respiratory: Negative.   Cardiovascular: Negative.   Gastrointestinal: Negative.   Endocrine: Negative.   Genitourinary: Negative.   Musculoskeletal: Negative.   Skin: Negative.   Allergic/Immunologic: Negative.   Neurological: Negative.   Hematological: Negative.   Psychiatric/Behavioral: Negative.       Objective:   Physical Exam  Constitutional: She is oriented to person, place, and time. She appears well-developed and well-nourished. No distress.  HENT:  Head: Normocephalic and atraumatic.  Eyes: Pupils are equal, round, and reactive to light. Conjunctivae are normal.  Neck: Normal range of motion.  Cardiovascular: Normal rate, regular rhythm, normal heart sounds and intact distal pulses.  Pulses:      Radial pulses are 2+ on the right side, and 2+ on the left side.       Dorsalis pedis pulses are 2+ on the right side, and 2+ on the left side.       Posterior tibial pulses are 2+ on the right side, and 2+ on the left side.   Pulmonary/Chest: Effort normal and breath sounds normal.  Musculoskeletal: Normal range of motion. She exhibits no edema.  Neurological: She is alert and oriented to person, place, and time.  Skin: Skin is warm and dry. She is not diaphoretic.  Psychiatric: She has a normal mood and affect. Her behavior is normal. Judgment and thought content normal.  Vitals reviewed.  BP 120/74 (BP Location: Right Arm, Patient Position: Sitting)   Pulse 81   Resp 14   Ht 5\' 5"  (1.651 m)   Wt 165 lb (74.8 kg)   BMI 27.46 kg/m   Past Medical History:  Diagnosis Date  . Anxiety   . Arthritis   . Cataracts, both eyes   . GERD (gastroesophageal reflux disease)   . Gout   . History of fracture of patella    right knee  . History of positive PPD    Patient always shows positive  . Hyperlipidemia   . Hypertension   . Hypothyroidism   . Lichen sclerosus 12/30/2013   of vulva  . Metatarsal fracture   . Neuropathy   . Peripheral vascular disease (HCC)   . Polyneuropathy    numbness and tingling in feet and toes  . Type 2 diabetes mellitus, uncontrolled (HCC)    Social History   Socioeconomic History  . Marital status: Married    Spouse name: Not on file  . Number of children: Not on file  . Years of education: Not on file  .  Highest education level: Not on file  Occupational History  . Not on file  Social Needs  . Financial resource strain: Not on file  . Food insecurity:    Worry: Not on file    Inability: Not on file  . Transportation needs:    Medical: Not on file    Non-medical: Not on file  Tobacco Use  . Smoking status: Former Smoker    Packs/day: 1.00    Years: 20.00    Pack years: 20.00    Types: Cigarettes    Last attempt to quit: 03/07/1996    Years since quitting: 21.1  . Smokeless tobacco: Never Used  . Tobacco comment: started smoking at age 59  Substance and Sexual Activity  . Alcohol use: No    Alcohol/week: 0.0 oz  . Drug use: No  . Sexual activity: Yes     Partners: Male    Birth control/protection: Surgical  Lifestyle  . Physical activity:    Days per week: Not on file    Minutes per session: Not on file  . Stress: Not on file  Relationships  . Social connections:    Talks on phone: Not on file    Gets together: Not on file    Attends religious service: Not on file    Active member of club or organization: Not on file    Attends meetings of clubs or organizations: Not on file    Relationship status: Not on file  . Intimate partner violence:    Fear of current or ex partner: Not on file    Emotionally abused: Not on file    Physically abused: Not on file    Forced sexual activity: Not on file  Other Topics Concern  . Not on file  Social History Narrative  . Not on file   Past Surgical History:  Procedure Laterality Date  . APPENDECTOMY    . BREAST REDUCTION SURGERY  2001  . CATARACT EXTRACTION    . CESAREAN SECTION  1976  . COLONOSCOPY  03/05/2013   Nml - due for repeat 03/06/2018  . DILATION AND CURETTAGE OF UTERUS  1989  . ESOPHAGOGASTRODUODENOSCOPY  03/05/2013  . EYE SURGERY    . Eyelid Surgery  2012  . INTRAMEDULLARY (IM) NAIL INTERTROCHANTERIC Left 10/30/2015   Procedure: INTRAMEDULLARY (IM) NAIL INTERTROCHANTRIC ;  Surgeon: Kennedy Bucker, MD;  Location: [facility];  Service: Orthopedics;  Laterality: Left;  . LAPAROSCOPIC HYSTERECTOMY  2000   total  . LOWER EXTREMITY ANGIOGRAPHY Left 03/08/2017   Procedure: LOWER EXTREMITY ANGIOGRAPHY;  Surgeon: Annice Needy, MD;  Location: [facility];  Service: Cardiovascular;  Laterality: Left;  . REDUCTION MAMMAPLASTY  1997   Family History  Problem Relation Age of Onset  . Coronary artery disease Father   . Heart attack Father   . Coronary artery disease Mother   . Heart attack Mother   . Ovarian cancer Sister 52       sister had hormonal therapy for IVF txs-which increased risk factor for ovarian cancer  . Breast cancer Neg Hx    No Known Allergies     Assessment &  Plan:  Whenever the patient presents for her first post procedure follow-up.  The patient is status post left lower extremity angiogram with intervention on March 08, 2017.  The patient presents today without complaint.  The patient notes an improvement to the symptoms in her left lower extremity.  The patient denies any claudication-like symptoms, rest pain  or ulceration to the bilateral lower extremity.  The patient underwent a bilateral ABI which was notable for right: 1.03 and left: 1.24.  This is an improvement from her previous ABI before her endovascular intervention where her ABI was right: 0.80 and left: 0.79.  The patient has triphasic tibials.  The patient denies any fever, nausea or vomiting.  1. PAD (peripheral artery disease) (HCC) - Stable The patient is status post a left lower extremity angiogram with intervention on March 08, 2017 The patient presents today with an improvement to the symptoms that were affecting her left lower extremity The patient denies any claudication-like symptoms, rest pain or ulceration to the bilateral lower extremity There has been an improvement in the bilateral ABI that was completed today The patient is to return in 6 months for an ABI and left lower extremity arterial duplex I have discussed with the patient at length the risk factors for and pathogenesis of atherosclerotic disease and encouraged a healthy diet, regular exercise regimen and blood pressure / glucose control.  The patient was encouraged to call the office in the interim if he experiences any claudication like symptoms, rest pain or ulcers to his feet / toes.  - VAS US ABI WITH/WO TBI; Future - VAS US LOWER EXTREMITY ARTERIAL DUPLEX; Future  2. Uncontrolled type 2 diabetes mellitus with hyperglycemia (HCC) - Stable Encouraged good control as its slows the progression of atherosclerotic disease  3. Hyperlipidemia, unspecified hyperlipidemia type - Stable Encouraged good control as its  slows the progression of atherosclerotic disease  Current Outpatient Medications on File Prior to Visit  Medication Sig Dispense Refill  . ALPRAZolam (XANAX) 0.25 MG tablet Take 0.25 mg by mouth at bedtime as needed for sleep.     [name]. aspirin EC 81 MG tablet Take 81 mg by mouth daily.     [name]. atorvastatin (LIPITOR) 20 MG tablet Take 20 mg by mouth at bedtime.  3  . buPROPion (WELLBUTRIN XL) 150 MG 24 hr tablet Take 150 mg by mouth daily. Take with 300 mg to equal 450 mg once daily    . buPROPion (WELLBUTRIN XL) 300 MG 24 hr tablet Take 300 mg by mouth daily. Take with 150 mg to equal 450 mg once daily  11  . BYSTOLIC 5 MG tablet Take 5 mg by mouth daily.  2  . cholecalciferol (VITAMIN D) 1000 units tablet Take 1,000 Units by mouth 2 (two) times daily.    [name]. CINNAMON PO Take 1 tablet by mouth daily.    . clopidogrel (PLAVIX) 75 MG tablet Take 1 tablet (75 mg total) by mouth daily. 30 tablet 11  . CORAL CALCIUM PO Take 1 tablet by mouth daily.    [name]. denosumab (PROLIA) 60 MG/ML SOLN injection Inject into the skin.    . DULoxetine (CYMBALTA) 60 MG capsule Take 60 mg by mouth 2 (two) times daily.     [name]. gemfibrozil (LOPID) 600 MG tablet Take 600 mg by mouth 2 (two) times daily.  3  . Insulin Pen Needle (B-D UF III MINI PEN NEEDLES) 31G X 5 MM MISC once daily.    . Iron-Vitamin C (VITRON-C) 65-125 MG TABS Take 1 tablet by mouth daily.     [name]. JANUMET 50-1000 MG per tablet Take 1 tablet by mouth 2 (two) times daily with a meal.  3  . levothyroxine (SYNTHROID, LEVOTHROID) 100 MCG tablet Take 100 mcg by mouth daily.  3  . lisinopril (PRINIVIL,ZESTRIL) 10 MG tablet Take 10 mg  by mouth daily.     . Magnesium 500 MG TABS Take 500 mg by mouth daily.    . Multiple Vitamin (MULTIVITAMIN WITH MINERALS) TABS tablet Take 1 tablet by mouth daily.    . Omega-3 Fatty Acids (FISH OIL) 1000 MG CAPS Take 1,000 mg by mouth daily.    . pantoprazole (PROTONIX) 40 MG tablet Take 40 mg by mouth daily.     . ranitidine (ZANTAC)  300 MG capsule Take 300 mg by mouth at bedtime.  1  . TRESIBA FLEXTOUCH 200 UNIT/ML SOPN INJECT 30 UNITS SUB-Q NIGHTLY  5  . vitamin B-12 (CYANOCOBALAMIN) 1000 MCG tablet Take 1,000 mcg by mouth daily.    . vitamin E 400 UNIT capsule Take 400 Units by mouth daily.    [name] zolpidem (AMBIEN) 10 MG tablet Take 10 mg by mouth at bedtime.  5   Current Facility-Administered Medications on File Prior to Visit  Medication Dose Route Frequency Provider Last Rate Last Dose  . Bevacizumab (AVASTIN) SOLN 1.25 mg  1.25 mg Intravitreal  [name], MD   1.25 mg at 02/10/17 0953  . Bevacizumab (AVASTIN) SOLN 1.25 mg  1.25 mg Intravitreal  [name], MD   1.25 mg at 03/13/17 1717   There are no Patient Instructions on file for this visit. No follow-ups on file.  Anderson Coppock A Jeslie Lowe, PA-C

## 2017-04-11 ENCOUNTER — Encounter (INDEPENDENT_AMBULATORY_CARE_PROVIDER_SITE_OTHER): Payer: Self-pay | Admitting: Ophthalmology

## 2017-04-11 ENCOUNTER — Ambulatory Visit (INDEPENDENT_AMBULATORY_CARE_PROVIDER_SITE_OTHER): Payer: Medicare Other | Admitting: Ophthalmology

## 2017-04-11 DIAGNOSIS — H35033 Hypertensive retinopathy, bilateral: Secondary | ICD-10-CM

## 2017-04-11 DIAGNOSIS — H35371 Puckering of macula, right eye: Secondary | ICD-10-CM | POA: Diagnosis not present

## 2017-04-11 DIAGNOSIS — H34831 Tributary (branch) retinal vein occlusion, right eye, with macular edema: Secondary | ICD-10-CM | POA: Diagnosis not present

## 2017-04-11 DIAGNOSIS — I10 Essential (primary) hypertension: Secondary | ICD-10-CM

## 2017-04-11 DIAGNOSIS — E113213 Type 2 diabetes mellitus with mild nonproliferative diabetic retinopathy with macular edema, bilateral: Secondary | ICD-10-CM

## 2017-04-11 DIAGNOSIS — Z961 Presence of intraocular lens: Secondary | ICD-10-CM | POA: Diagnosis not present

## 2017-04-11 DIAGNOSIS — H3581 Retinal edema: Secondary | ICD-10-CM

## 2017-04-11 MED ORDER — BEVACIZUMAB CHEMO INJECTION 1.25MG/0.05ML SYRINGE FOR KALEIDOSCOPE
1.2500 mg | INTRAVITREAL | Status: DC
  Administered 2017-04-11: 1.25 mg via INTRAVITREAL

## 2017-04-17 ENCOUNTER — Telehealth (INDEPENDENT_AMBULATORY_CARE_PROVIDER_SITE_OTHER): Payer: Self-pay

## 2017-04-17 NOTE — Telephone Encounter (Signed)
Patient called stating she is having some oozing and a yeasty smell from where the leg was accessed when she had a right leg angiogram on 03-08-17

## 2017-04-17 NOTE — Telephone Encounter (Signed)
Returned the patient's call and gave her the recommendations from Dr. Wyn Quakerew she understood. See notes below.

## 2017-04-18 IMAGING — US US EXTREM LOW VENOUS*L*
1 series · 13 of 24 positions shown · non-contrast
Comparison: None.

CLINICAL DATA: -year-old female with left lower extremity pain



[Series 1: us extrem low venous*left* · 0.08mm/px · 13 of 38 slices shown]
[im 1/38]
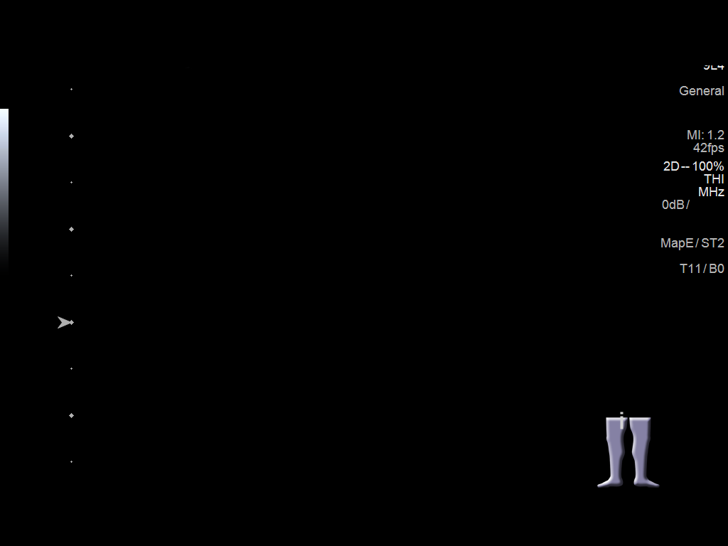
[im 4/38]
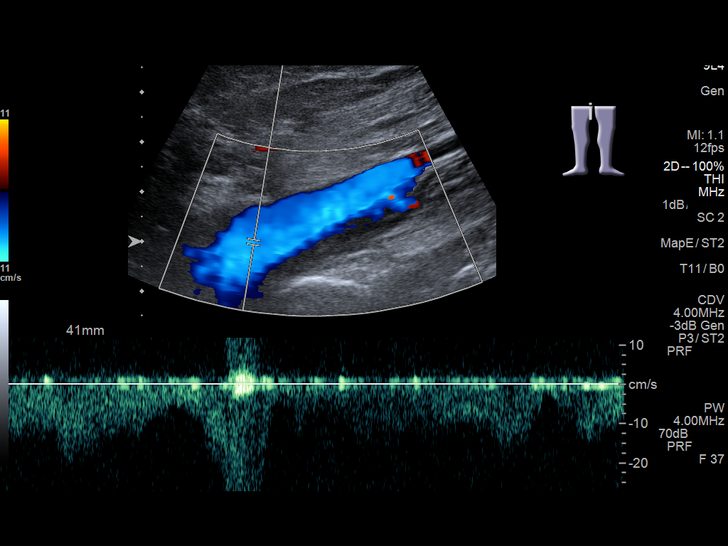
[im 7/38]
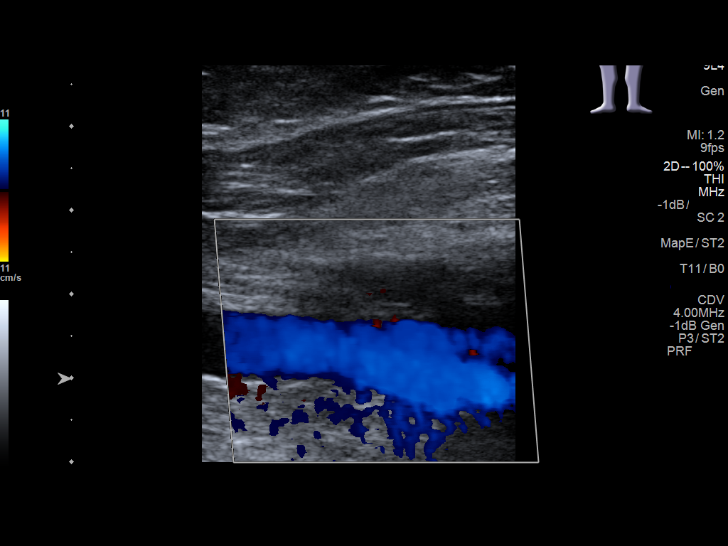
[im 10/38]
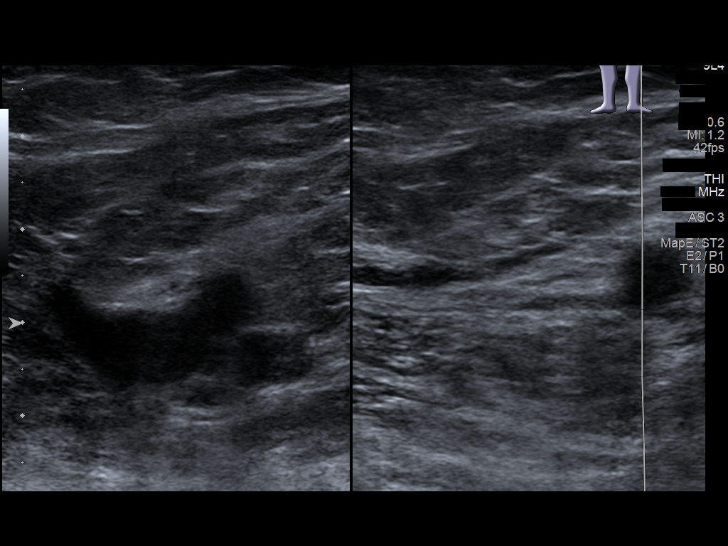
[im 13/38]
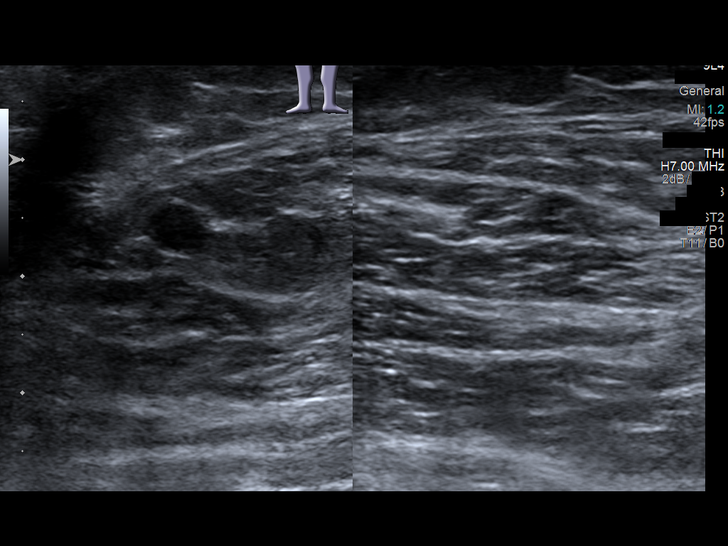
[im 17/38]
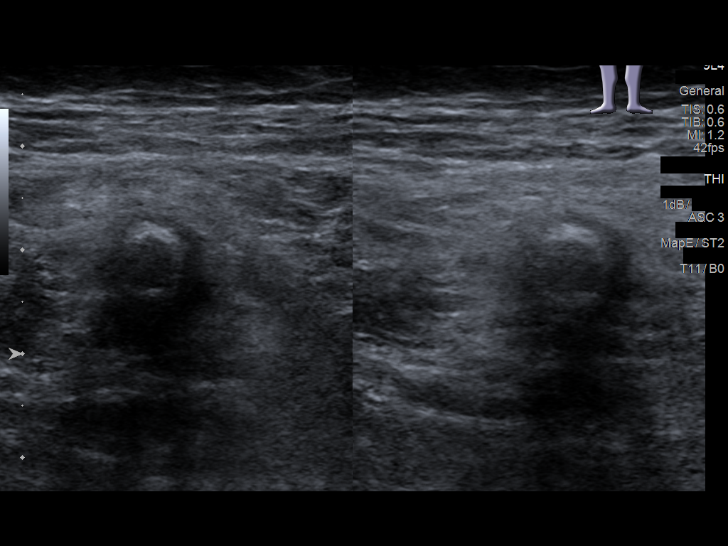
[im 20/38]
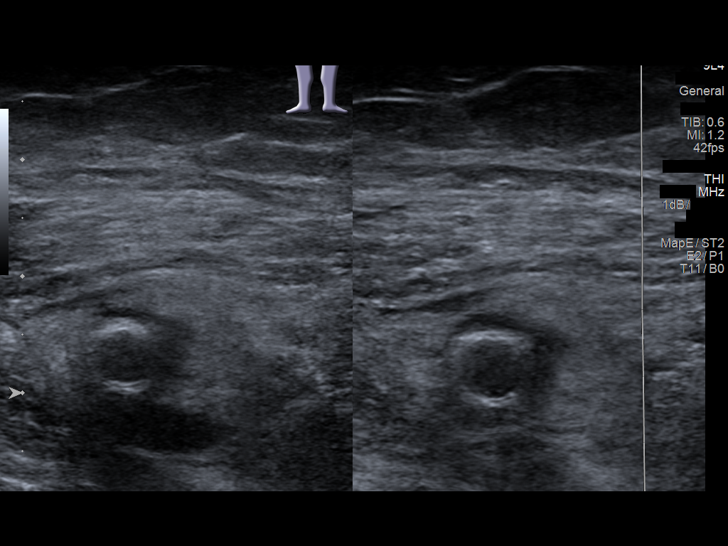
[im 21/38]
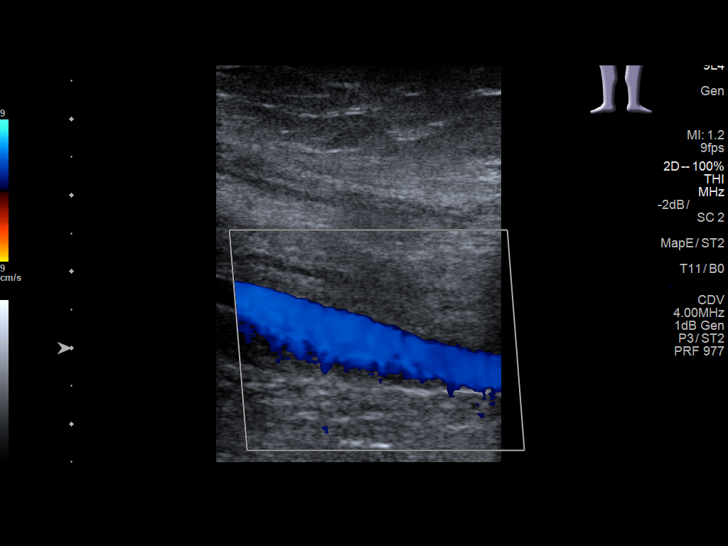
[im 25/38]
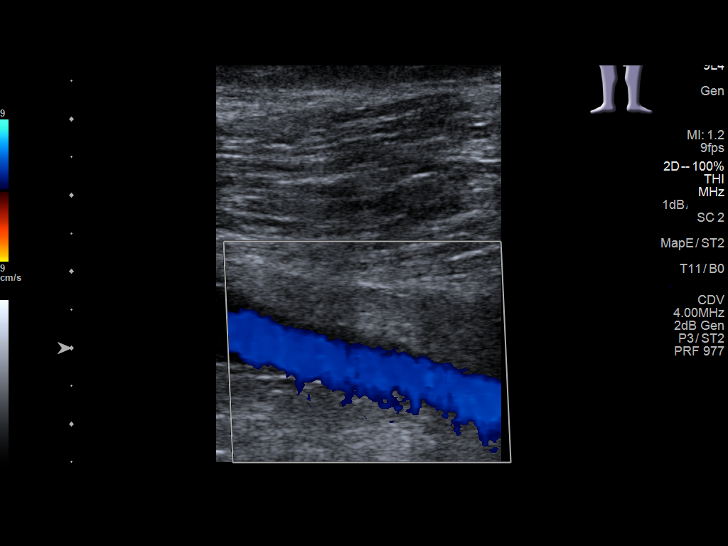
[im 28/38]
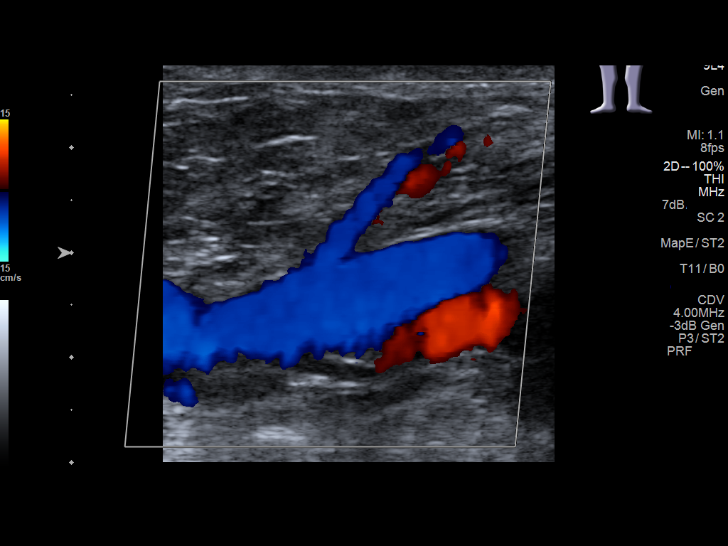
[im 31/38]
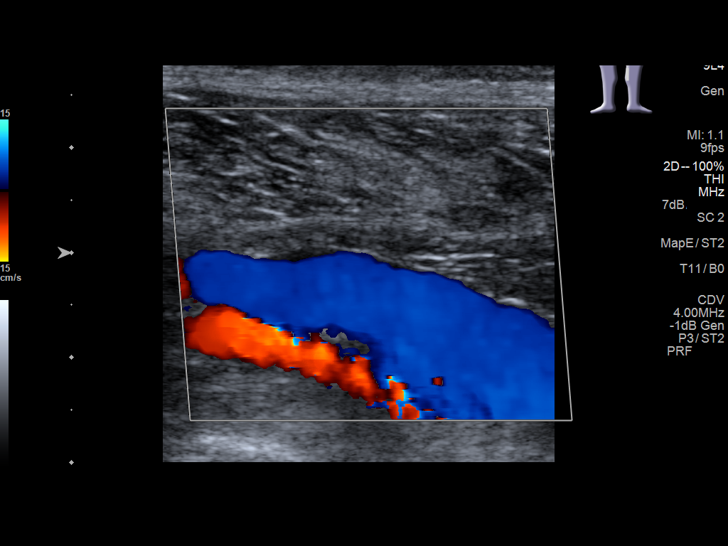
[im 34/38]
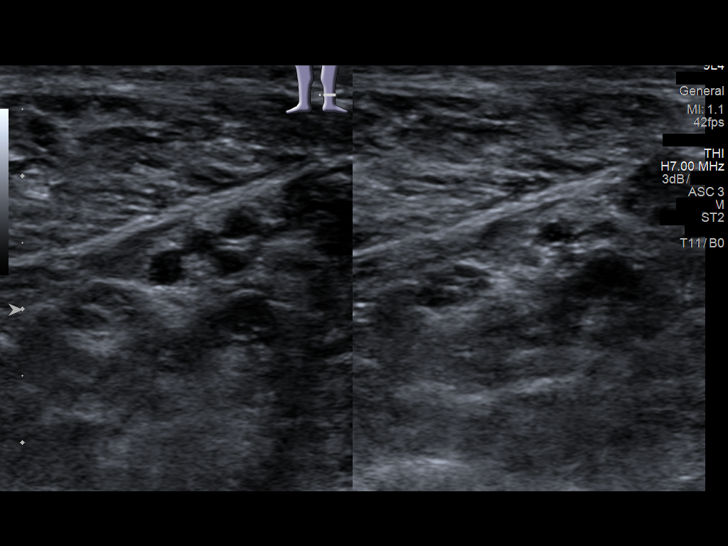
[im 38/38]
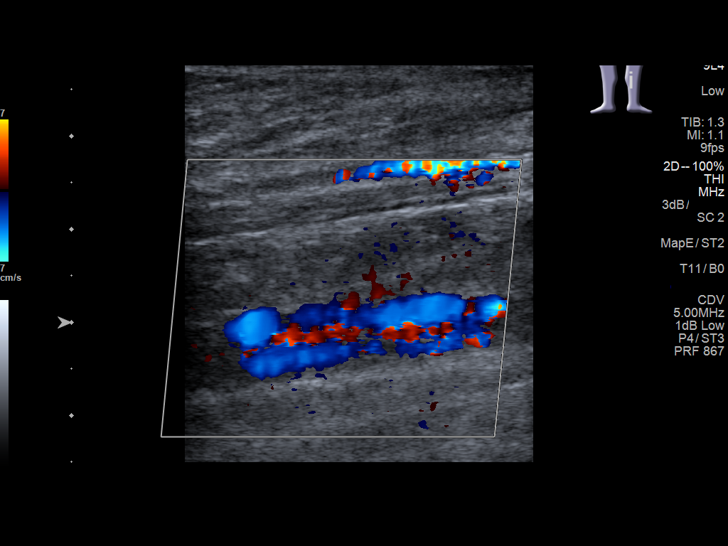

[13 of 24 positions shown; findings below may reference images not displayed]

FINDINGS: Contralateral Common Femoral Vein: Respiratory phasicity is normal
and symmetric with the symptomatic side. No evidence of thrombus.
Normal compressibility.

Common Femoral Vein: No evidence of thrombus. Normal
compressibility, respiratory phasicity and response to augmentation.

Saphenofemoral Junction: No evidence of thrombus. Normal
compressibility and flow on color Doppler imaging.

Profunda Femoral Vein: No evidence of thrombus. Normal
compressibility and flow on color Doppler imaging.

Femoral Vein: No evidence of thrombus. Normal compressibility,
respiratory phasicity and response to augmentation.

Popliteal Vein: No evidence of thrombus. Normal compressibility,
respiratory phasicity and response to augmentation.

Calf Veins: No evidence of thrombus. Normal compressibility and flow
on color Doppler imaging.

Superficial Great Saphenous Vein: No evidence of thrombus. Normal
compressibility.

Venous Reflux:  None.

Other Findings:  None.
IMPRESSION: No evidence of deep venous thrombosis.

## 2017-04-23 ENCOUNTER — Encounter (INDEPENDENT_AMBULATORY_CARE_PROVIDER_SITE_OTHER): Payer: Self-pay | Admitting: Ophthalmology

## 2017-04-23 ENCOUNTER — Encounter (INDEPENDENT_AMBULATORY_CARE_PROVIDER_SITE_OTHER): Payer: Self-pay | Admitting: Vascular Surgery

## 2017-04-24 ENCOUNTER — Encounter (INDEPENDENT_AMBULATORY_CARE_PROVIDER_SITE_OTHER): Payer: Self-pay | Admitting: Ophthalmology

## 2017-04-24 ENCOUNTER — Ambulatory Visit (INDEPENDENT_AMBULATORY_CARE_PROVIDER_SITE_OTHER): Payer: Medicare Other | Admitting: Ophthalmology

## 2017-04-24 DIAGNOSIS — H43391 Other vitreous opacities, right eye: Secondary | ICD-10-CM

## 2017-04-24 DIAGNOSIS — H35371 Puckering of macula, right eye: Secondary | ICD-10-CM

## 2017-04-24 DIAGNOSIS — Z961 Presence of intraocular lens: Secondary | ICD-10-CM

## 2017-04-24 DIAGNOSIS — H3581 Retinal edema: Secondary | ICD-10-CM | POA: Diagnosis not present

## 2017-04-24 DIAGNOSIS — H34831 Tributary (branch) retinal vein occlusion, right eye, with macular edema: Secondary | ICD-10-CM

## 2017-04-24 DIAGNOSIS — H35033 Hypertensive retinopathy, bilateral: Secondary | ICD-10-CM

## 2017-04-24 DIAGNOSIS — E113213 Type 2 diabetes mellitus with mild nonproliferative diabetic retinopathy with macular edema, bilateral: Secondary | ICD-10-CM

## 2017-04-24 DIAGNOSIS — I10 Essential (primary) hypertension: Secondary | ICD-10-CM

## 2017-04-24 NOTE — Progress Notes (Signed)
[facility] - Clinic Note  04/24/2017     CHIEF COMPLAINT Patient presents for Flashes/floaters   HISTORY OF PRESENT ILLNESS: Cheryl Leonard is a 72 y.o. female who presents to the clinic today for:   HPI    Flashes/floaters    In right eye.  This started 4 days ago.  Duration Constant.  Since onset it is stable.  Associated Symptoms Floaters.  Negative for Blind Spot, Glare, Shoulder/Hip pain, Fatigue, Jaw Claudication, Photophobia, Distortion, Redness, Scalp Tenderness, Weight Loss, Fever, Trauma, Pain and Flashes.  Context:  distance vision, mid-range vision and near vision.  Treatments tried include injection.  Response to treatment was no improvement.  I, the attending physician,  performed the HPI with the patient and updated documentation appropriately.          Comments    //F/U CME OD/Floaters OD. Pt states last Thursday (04/20/17) she noticed a black dot on her shirt, she slapped it and dot went up(she thought it may have been a spider but it wasn't ), next day (Friday) she noticed giggly lines(like a fly) in her vision OD, she continues to have floaters Od. Pt reports after having Avastin (04/11/17) she noticed a busted blood vessel Od, it gradually faded away. Denies ocular pain,flashes and light sensitivity. BS 75 yesterday(04/23/17).         Last edited by [name], MD on 04/24/2017 10:50 AM. (History)    Pt states on Thursday after having injection she saw a dot; Pt states the dot was small and states it "moved"; Pt reports the next day, the spot "was bigger"; Pt states she now is able to see a jagged line that looked like "lightnight"; Pt states it was in central vision; Pt denies any ocular pain; Pt states OU VA is stable; Pt reports after being dilated today she is unable to see anything;   Referring physician: Danella Penton, MD [phone] [facility] [address] [facility] [place], [place] [address]  HISTORICAL INFORMATION:    Selected notes from the MEDICAL RECORD NUMBER Referred by Dr. Senaida Ores for concern of DME OD;  LEE- 02.05.19 [BCVA OD: 20/70-1 OS: 20/30-1] Ocular Hx-  PMH- DM Lab Results  Component Value Date   HGBA1C 5.9 (H) 10/29/2015       CURRENT MEDICATIONS: No current outpatient medications on file. (Ophthalmic Drugs)   No current facility-administered medications for this visit.  (Ophthalmic Drugs)   Current Outpatient Medications (Other)  Medication Sig  . ALPRAZolam (XANAX) 0.25 MG tablet Take 0.25 mg by mouth at bedtime as needed for sleep.   [name] aspirin EC 81 MG tablet Take 81 mg by mouth daily.   [name] atorvastatin (LIPITOR) 20 MG tablet Take 20 mg by mouth at bedtime.  [name] buPROPion (WELLBUTRIN XL) 150 MG 24 hr tablet Take 150 mg by mouth daily. Take with 300 mg to equal 450 mg once daily  . buPROPion (WELLBUTRIN XL) 300 MG 24 hr tablet Take 300 mg by mouth daily. Take with 150 mg to equal 450 mg once daily  . BYSTOLIC 5 MG tablet Take 5 mg by mouth daily.  . cholecalciferol (VITAMIN D) 1000 units tablet Take 1,000 Units by mouth 2 (two) times daily.  [name] CINNAMON PO Take 1 tablet by mouth daily.  . clopidogrel (PLAVIX) 75 MG tablet Take 1 tablet (75 mg total) by mouth daily.  [name] CORAL CALCIUM PO Take 1 tablet by mouth daily.  [name] denosumab (PROLIA) 60 MG/ML SOLN  injection Inject into the skin.  . DULoxetine (CYMBALTA) 60 MG capsule Take 60 mg by mouth 2 (two) times daily.   [name] gemfibrozil (LOPID) 600 MG tablet Take 600 mg by mouth 2 (two) times daily.  . Insulin Pen Needle (B-D UF III MINI PEN NEEDLES) 31G X 5 MM MISC once daily.  . Iron-Vitamin C (VITRON-C) 65-125 MG TABS Take 1 tablet by mouth daily.   [name] JANUMET 50-1000 MG per tablet Take 1 tablet by mouth 2 (two) times daily with a meal.  . levothyroxine (SYNTHROID, LEVOTHROID) 100 MCG tablet Take 100 mcg by mouth daily.  [name] lisinopril (PRINIVIL,ZESTRIL) 10 MG tablet Take 10 mg by mouth daily.   . Magnesium 500 MG TABS Take 500 mg by mouth  daily.  . Multiple Vitamin (MULTIVITAMIN WITH MINERALS) TABS tablet Take 1 tablet by mouth daily.  . Omega-3 Fatty Acids (FISH OIL) 1000 MG CAPS Take 1,000 mg by mouth daily.  . pantoprazole (PROTONIX) 40 MG tablet Take 40 mg by mouth daily.   . ranitidine (ZANTAC) 300 MG capsule Take 300 mg by mouth at bedtime.  . TRESIBA FLEXTOUCH 200 UNIT/ML SOPN INJECT 30 UNITS SUB-Q NIGHTLY  . vitamin B-12 (CYANOCOBALAMIN) 1000 MCG tablet Take 1,000 mcg by mouth daily.  . vitamin E 400 UNIT capsule Take 400 Units by mouth daily.  [name] zolpidem (AMBIEN) 10 MG tablet Take 10 mg by mouth at bedtime.   Current Facility-Administered Medications (Other)  Medication Route  . Bevacizumab (AVASTIN) SOLN 1.25 mg Intravitreal  . Bevacizumab (AVASTIN) SOLN 1.25 mg Intravitreal  . Bevacizumab (AVASTIN) SOLN 1.25 mg Intravitreal      REVIEW OF SYSTEMS: ROS    Positive for: Endocrine, Eyes   Negative for: Constitutional, Gastrointestinal, Neurological, Skin, Musculoskeletal, HENT, Cardiovascular, Respiratory, Psychiatric, Allergic/Imm, Heme/Lymph   Last edited by Eldridge Scot, LPN on 1/61/0960  9:55 AM. (History)       ALLERGIES No Known Allergies  PAST MEDICAL HISTORY Past Medical History:  Diagnosis Date  . Anxiety   . Arthritis   . Cataracts, both eyes   . GERD (gastroesophageal reflux disease)   . Gout   . History of fracture of patella    right knee  . History of positive PPD    Patient always shows positive  . Hyperlipidemia   . Hypertension   . Hypothyroidism   . Lichen sclerosus 12/30/2013   of vulva  . Metatarsal fracture   . Neuropathy   . Peripheral vascular disease (HCC)   . Polyneuropathy    numbness and tingling in feet and toes  . Type 2 diabetes mellitus, uncontrolled (HCC)    Past Surgical History:  Procedure Laterality Date  . APPENDECTOMY    . BREAST REDUCTION SURGERY  2001  . CATARACT EXTRACTION    . CESAREAN SECTION  1976  . COLONOSCOPY  03/05/2013   Nml - due  for repeat 03/06/2018  . DILATION AND CURETTAGE OF UTERUS  1989  . ESOPHAGOGASTRODUODENOSCOPY  03/05/2013  . EYE SURGERY    . Eyelid Surgery  2012  . INTRAMEDULLARY (IM) NAIL INTERTROCHANTERIC Left 10/30/2015   Procedure: INTRAMEDULLARY (IM) NAIL INTERTROCHANTRIC ;  Surgeon: Kennedy Bucker, MD;  Location: [facility] ORS;  Service: Orthopedics;  Laterality: Left;  . LAPAROSCOPIC HYSTERECTOMY  2000   total  . LOWER EXTREMITY ANGIOGRAPHY Left 03/08/2017   Procedure: LOWER EXTREMITY ANGIOGRAPHY;  Surgeon: Annice Needy, MD;  Location: [facility] INVASIVE CV LAB;  Service: Cardiovascular;  Laterality: Left;  . REDUCTION MAMMAPLASTY  1997  FAMILY HISTORY Family History  Problem Relation Age of Onset  . Coronary artery disease Father   . Heart attack Father   . Coronary artery disease Mother   . Heart attack Mother   . Ovarian cancer Sister [id number]       sister had hormonal therapy for IVF txs-which increased risk factor for ovarian cancer  . Breast cancer Neg Hx     SOCIAL HISTORY Social History   Tobacco Use  . Smoking status: Former Smoker    Packs/day: 1.00    Years: 20.00    Pack years: 20.00    Types: Cigarettes    Last attempt to quit: 03/07/1996    Years since quitting: 21.1  . Smokeless tobacco: Never Used  . Tobacco comment: started smoking at age 72  Substance Use Topics  . Alcohol use: No    Alcohol/week: 0.0 oz  . Drug use: No         OPHTHALMIC EXAM:  Base Eye Exam    Visual Acuity (Snellen - Linear)      Right Left   Dist [place] 20/60 -1 20/25 -1   Dist ph [place] 20/40 -1 20/20 -2       Tonometry (Tonopen, 9:48 AM)      Right Left   Pressure 12 14       Pupils      Dark Light Shape React APD   Right 4 2 Round Brisk None   Left 4 2 Round Brisk None       Visual Fields (Counting fingers)      Left Right    Full Full       Extraocular Movement      Right Left    Full, Ortho Full, Ortho       Neuro/Psych    Oriented x3:  Yes   Mood/Affect:  Normal       Dilation     Both eyes:  1.0% Mydriacyl, 2.5% Phenylephrine @ 9:48 AM        Slit Lamp and Fundus Exam    External Exam      Right Left   External Normal Normal       Slit Lamp Exam      Right Left   Lids/Lashes dermatochalasis dermatochalasis   Conjunctiva/Sclera White and quiet White and quiet   Cornea arcus; well healed cataract wound; ,2- 3+ Punctate epithelial erosions, irregular epi surface arcus; well healed cataract wound, 2-3+ Punctate epithelial erosions, irregualr epi surface    Anterior Chamber deep deep   Iris round; mod dilated round; mod dilated   Lens PCIOL; open PC PCIOL; open PC   Vitreous syneresis, Posterior vitreous detachment, floaters inferiorly syneresis, Posterior vitreous detachment       Fundus Exam      Right Left   Disc mild flame hemes superiorly ? worse Normal   C/D Ratio 0.4 0.5   Macula Blunted foveal reflex, Cystic changes - improved from prior, scattered Microaneurysms, Epiretinal membrane flat; no heme or edema   Vessels mildly tortuous; dilated superior arcade venules; mild CWS along sup temp arcade; AV crossing changes mild tortuosity; AV crossing changes   Periphery attached; scattered dot hemes temporally attached          IMAGING AND PROCEDURES  Imaging and Procedures for 04/25/17  OCT, Retina - OU - Both Eyes       Right Eye Quality was good. Central Foveal Thickness: 384. Progression has worsened. Findings include abnormal foveal contour, epiretinal  membrane, intraretinal fluid, no SRF.   Left Eye Quality was good. Central Foveal Thickness: 278. Progression has been stable. Findings include normal foveal contour, no IRF, no SRF.   Notes *Images captured and stored on drive  Diagnosis / Impression:  CME OD -- mild interval increase NFP; no IRF/SRF OS  Clinical management:  See below  Abbreviations: NFP - Normal foveal profile. CME - cystoid macular edema. PED - pigment epithelial detachment. IRF - intraretinal fluid. SRF -  subretinal fluid. EZ - ellipsoid zone. ERM - epiretinal membrane. ORA - outer retinal atrophy. ORT - outer retinal tubulation. SRHM - subretinal hyper-reflective material                 ASSESSMENT/PLAN:    ICD-10-CM   1. Floaters in visual field, right H43.391   2. Branch retinal vein occlusion of right eye with macular edema H34.8310 OCT, Retina - OU - Both Eyes  3. Retinal edema H35.81   4. 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 Essential hypertension I10   6. Hypertensive retinopathy of both eyes H35.033   7. Epiretinal membrane (ERM) of right eye H35.371   8. Pseudophakia of both eyes Z96.1    1. Floaters OD  - acute floaters OD post injection - symptoms resolved today -- likely residual bubbles from medication - no frank RT/RD  2, 3. CME OD -- likely remote BRVO OD - by history, pt states symptoms first noticed 2 wks prior to presentation, but reports changes may have occurred prior - initial exam with differential tortuosity of vessels (OD > OS) - FA on 2.8.19 (initial visit) with petaloid leakage and hyperfluorescence of the disc - differential includes DM2 (DME), hypertensive retinopathy, inflammatory etiology / uveitis - S/P IVA OD #1 (02.08.19), #2 (03.11.19), #3 (04.09.19) - OCT today with slight interval increase in CME OD - BCVA stable at 20/40 OD today - F/U as scheduled for DFE/OCT/possible injection  4. Mild nonproliferative diabetic retinopathy, both eyes - The incidence, risk factors for progression, natural history and treatment options for diabetic retinopathy were discussed with patient.   - The need for close monitoring of blood glucose, blood pressure, and serum lipids, avoiding cigarette or any type of tobacco, and the need for long term follow up was also discussed with patient. - could be contributing to CME OD - however OS with minimal  diabetic retinopathy - continue to monitor  5, 6. Hypertensive retinopathy OU - stable - as above, may be contributing to CME OD - discussed importance of tight BP control - monitor  7. Epiretinal membrane, right eye  The natural history, anatomy, potential for loss of vision, and treatment options including vitrectomy techniques and the complications of endophthalmitis, retinal detachment, vitreous hemorrhage, cataract progression and permanent vision loss discussed with the patient. - stable - no indication for surgery at this time  8. Pseudophakia OU - s/p CE/IOL OU by cataract surgeon in [facility] - doing well - monitor  Ophthalmic Meds Ordered this visit:  No orders of the defined types were placed in this encounter.      No follow-ups on file.  There are no Patient Instructions on file for this visit.   Explained the diagnoses, plan, and follow up with the patient and they expressed understanding.  Patient expressed understanding of the importance of proper follow up care.   This document serves as a record of services personally  performed by Karie Chimera, MD, PhD. It was created on their behalf by Virgilio Belling, COA, a certified ophthalmic assistant. The creation of this record is the provider's dictation and/or activities during the visit.  Electronically signed by: Virgilio Belling, COA  04/25/17 5:13 PM   Karie Chimera, M.D., Ph.D. Diseases & Surgery of the Retina and Vitreous [facility] 04/25/17  I have reviewed the above documentation for accuracy and completeness, and I agree with the above. Karie Chimera, M.D., Ph.D. 04/25/17 5:13 PM    Abbreviations: M myopia (nearsighted); A astigmatism; H hyperopia (farsighted); P presbyopia; Mrx spectacle prescription;  CTL contact lenses; OD right eye; OS left eye; OU both eyes  XT exotropia; ET esotropia; PEK punctate epithelial keratitis; PEE punctate epithelial erosions; DES dry eye  syndrome; MGD meibomian gland dysfunction; ATs artificial tears; PFAT's preservative free artificial tears; NSC nuclear sclerotic cataract; PSC posterior subcapsular cataract; ERM epi-retinal membrane; PVD posterior vitreous detachment; RD retinal detachment; DM diabetes mellitus; DR diabetic retinopathy; NPDR non-proliferative diabetic retinopathy; PDR proliferative diabetic retinopathy; CSME clinically significant macular edema; DME diabetic macular edema; dbh dot blot hemorrhages; CWS cotton wool spot; POAG primary open angle glaucoma; C/D cup-to-disc ratio; HVF humphrey visual field; GVF goldmann visual field; OCT optical coherence tomography; IOP intraocular pressure; BRVO Branch retinal vein occlusion; CRVO central retinal vein occlusion; CRAO central retinal artery occlusion; BRAO branch retinal artery occlusion; RT retinal tear; SB scleral buckle; PPV pars plana vitrectomy; VH Vitreous hemorrhage; PRP panretinal laser photocoagulation; IVK intravitreal kenalog; VMT vitreomacular traction; MH Macular hole;  NVD neovascularization of the disc; NVE neovascularization elsewhere; AREDS age related eye disease study; ARMD age related macular degeneration; POAG primary open angle glaucoma; EBMD epithelial/anterior basement membrane dystrophy; ACIOL anterior chamber intraocular lens; IOL intraocular lens; PCIOL posterior chamber intraocular lens; Phaco/IOL phacoemulsification with intraocular lens placement; PRK photorefractive keratectomy; LASIK laser assisted in situ keratomileusis; HTN hypertension; DM diabetes mellitus; COPD chronic obstructive pulmonary disease

## 2017-04-25 ENCOUNTER — Ambulatory Visit (INDEPENDENT_AMBULATORY_CARE_PROVIDER_SITE_OTHER): Payer: Medicare Other | Admitting: Vascular Surgery

## 2017-04-25 ENCOUNTER — Encounter (INDEPENDENT_AMBULATORY_CARE_PROVIDER_SITE_OTHER): Payer: Self-pay | Admitting: Ophthalmology

## 2017-04-25 ENCOUNTER — Encounter (INDEPENDENT_AMBULATORY_CARE_PROVIDER_SITE_OTHER): Payer: Self-pay | Admitting: Vascular Surgery

## 2017-04-25 VITALS — BP 130/80 | HR 78 | Resp 13 | Ht 65.0 in | Wt 163.0 lb

## 2017-04-25 DIAGNOSIS — E1165 Type 2 diabetes mellitus with hyperglycemia: Secondary | ICD-10-CM

## 2017-04-25 DIAGNOSIS — I70299 Other atherosclerosis of native arteries of extremities, unspecified extremity: Secondary | ICD-10-CM | POA: Diagnosis not present

## 2017-04-25 DIAGNOSIS — I739 Peripheral vascular disease, unspecified: Secondary | ICD-10-CM | POA: Diagnosis not present

## 2017-04-25 DIAGNOSIS — E785 Hyperlipidemia, unspecified: Secondary | ICD-10-CM | POA: Diagnosis not present

## 2017-04-25 DIAGNOSIS — L97909 Non-pressure chronic ulcer of unspecified part of unspecified lower leg with unspecified severity: Secondary | ICD-10-CM | POA: Diagnosis not present

## 2017-04-25 NOTE — Progress Notes (Signed)
Subjective:    Patient ID: Cheryl Leonard, female    DOB: 11-Mar-1945, 72 y.o.   MRN: [id number] Chief Complaint  Patient presents with  . Follow-up    Wound bleeding   The patient presents with a chief complaint of an "open wound" to the right groin.  This is the access site for her recent left lower extremity angiogram. The patient is status post a left lower extremity angiogram with intervention on March 08, 2017.  The patient noticed the access site has not "healed".  The patient denies any erythema, pain, drainage, fever, nausea or vomiting.  The patient denies any claudication-like symptoms, rest pain or worsening ulceration to the bilateral lower extremity. The patient will be traveling to [place] next month and is concerned and would like somebody to look at it.  Review of Systems  Constitutional: Negative.   HENT: Negative.   Eyes: Negative.   Respiratory: Negative.   Cardiovascular:       Peripheral artery disease  Gastrointestinal: Negative.   Endocrine: Negative.   Genitourinary: Negative.   Musculoskeletal: Negative.   Skin: Positive for wound.  Allergic/Immunologic: Negative.   Neurological: Negative.   Hematological: Negative.   Psychiatric/Behavioral: Negative.       Objective:   Physical Exam  Constitutional: She is oriented to person, place, and time. She appears well-developed and well-nourished. No distress.  HENT:  Head: Normocephalic and atraumatic.  Right Ear: External ear normal.  Left Ear: External ear normal.  Eyes: Pupils are equal, round, and reactive to light. Conjunctivae and EOM are normal.  Neck: Normal range of motion.  Cardiovascular: Normal rate, regular rhythm, normal heart sounds and intact distal pulses.  Pulmonary/Chest: Effort normal.  Neurological: She is alert and oriented to person, place, and time.  Skin: Skin is warm and dry. She is not diaphoretic.  Right groin: Very small needle mark noted.  This is from the patient's left  lower extremity angiogram access site.  It is clean and dry.  There is no surrounding erythema or induration.  There is no drainage.  Psychiatric: She has a normal mood and affect. Her behavior is normal. Judgment and thought content normal.  Vitals reviewed.  BP 130/80 (BP Location: Right Arm, Patient Position: Sitting)   Pulse 78   Resp 13   Ht 5\' 5"  (1.651 m)   Wt 163 lb (73.9 kg)   BMI 27.12 kg/m   Past Medical History:  Diagnosis Date  . Anxiety   . Arthritis   . Cataracts, both eyes   . GERD (gastroesophageal reflux disease)   . Gout   . History of fracture of patella    right knee  . History of positive PPD    Patient always shows positive  . Hyperlipidemia   . Hypertension   . Hypothyroidism   . Lichen sclerosus 12/30/2013   of vulva  . Metatarsal fracture   . Neuropathy   . Peripheral vascular disease (HCC)   . Polyneuropathy    numbness and tingling in feet and toes  . Type 2 diabetes mellitus, uncontrolled (HCC)    Social History   Socioeconomic History  . Marital status: Married    Spouse name: Not on file  . Number of children: Not on file  . Years of education: Not on file  . Highest education level: Not on file  Occupational History  . Not on file  Social Needs  . Financial resource strain: Not on file  . Food insecurity:  Worry: Not on file    Inability: Not on file  . Transportation needs:    Medical: Not on file    Non-medical: Not on file  Tobacco Use  . Smoking status: Former Smoker    Packs/day: 1.00    Years: 20.00    Pack years: 20.00    Types: Cigarettes    Last attempt to quit: 03/07/1996    Years since quitting: 21.1  . Smokeless tobacco: Never Used  . Tobacco comment: started smoking at age 25  Substance and Sexual Activity  . Alcohol use: No    Alcohol/week: 0.0 oz  . Drug use: No  . Sexual activity: Yes    Partners: Male    Birth control/protection: Surgical  Lifestyle  . Physical activity:    Days per week: Not on  file    Minutes per session: Not on file  . Stress: Not on file  Relationships  . Social connections:    Talks on phone: Not on file    Gets together: Not on file    Attends religious service: Not on file    Active member of club or organization: Not on file    Attends meetings of clubs or organizations: Not on file    Relationship status: Not on file  . Intimate partner violence:    Fear of current or ex partner: Not on file    Emotionally abused: Not on file    Physically abused: Not on file    Forced sexual activity: Not on file  Other Topics Concern  . Not on file  Social History Narrative  . Not on file   Past Surgical History:  Procedure Laterality Date  . APPENDECTOMY    . BREAST REDUCTION SURGERY  2001  . CATARACT EXTRACTION    . CESAREAN SECTION  1976  . COLONOSCOPY  03/05/2013   Nml - due for repeat 03/06/2018  . DILATION AND CURETTAGE OF UTERUS  1989  . ESOPHAGOGASTRODUODENOSCOPY  03/05/2013  . EYE SURGERY    . Eyelid Surgery  2012  . INTRAMEDULLARY (IM) NAIL INTERTROCHANTERIC Left 10/30/2015   Procedure: INTRAMEDULLARY (IM) NAIL INTERTROCHANTRIC ;  Surgeon: Kennedy Bucker, MD;  Location: [facility]S;  Service: Orthopedics;  Laterality: Left;  . LAPAROSCOPIC HYSTERECTOMY  2000   total  . LOWER EXTREMITY ANGIOGRAPHY Left 03/08/2017   Procedure: LOWER EXTREMITY ANGIOGRAPHY;  Surgeon: Annice Needy, MD;  Location: [facility];  Service: Cardiovascular;  Laterality: Left;  . REDUCTION MAMMAPLASTY  1997   Family History  Problem Relation Age of Onset  . Coronary artery disease Father   . Heart attack Father   . Coronary artery disease Mother   . Heart attack Mother   . Ovarian cancer Sister 58       sister had hormonal therapy for IVF txs-which increased risk factor for ovarian cancer  . Breast cancer Neg Hx    No Known Allergies     Assessment & Plan:  The patient presents with a chief complaint of an "open wound" to the right groin.  This is the access site  for her recent left lower extremity angiogram. The patient is status post a left lower extremity angiogram with intervention on March 08, 2017.  The patient noticed the access site has not "healed".  The patient denies any erythema, pain, drainage, fever, nausea or vomiting.  The patient denies any claudication-like symptoms, rest pain or worsening ulceration to the bilateral lower extremity. The patient will be traveling  to [place] next month and is concerned and would like somebody to look at it.  1. PAD (peripheral artery disease) (HCC) - Stable Patient with very small right groin access site which has not yet fully healed It is not infected and I do not see any reason why that it will not heal in the upcoming few days/week. I placed Steri-Strips to the area. Due to the location of the access site the Steri-Strips will most likely fall off.  If the Steri-Strips fall off the patient was directed to keep the area clean and dry. If the patient should notice any erythema or drainage to the site she should call the office however I do not feel that this will happen. The patient is able to travel to [place] next month this should not interfere with her plans. The patient already has a follow-up in October 2019  2. Uncontrolled type 2 diabetes mellitus with hyperglycemia (HCC) - Stable Encouraged good control as its slows the progression of atherosclerotic disease  3. Hyperlipidemia, unspecified hyperlipidemia type - Stable Encouraged good control as its slows the progression of atherosclerotic disease  Current Outpatient Medications on File Prior to Visit  Medication Sig Dispense Refill  . ALPRAZolam (XANAX) 0.25 MG tablet Take 0.25 mg by mouth at bedtime as needed for sleep.     [name]. aspirin EC 81 MG tablet Take 81 mg by mouth daily.     [name]. atorvastatin (LIPITOR) 20 MG tablet Take 20 mg by mouth at bedtime.  3  . buPROPion (WELLBUTRIN XL) 150 MG 24 hr tablet Take 150 mg by mouth daily. Take with 300 mg  to equal 450 mg once daily    . buPROPion (WELLBUTRIN XL) 300 MG 24 hr tablet Take 300 mg by mouth daily. Take with 150 mg to equal 450 mg once daily  11  . BYSTOLIC 5 MG tablet Take 5 mg by mouth daily.  2  . cholecalciferol (VITAMIN D) 1000 units tablet Take 1,000 Units by mouth 2 (two) times daily.    [name]. CINNAMON PO Take 1 tablet by mouth daily.    . clopidogrel (PLAVIX) 75 MG tablet Take 1 tablet (75 mg total) by mouth daily. 30 tablet 11  . CORAL CALCIUM PO Take 1 tablet by mouth daily.    [name]. denosumab (PROLIA) 60 MG/ML SOLN injection Inject into the skin.    . DULoxetine (CYMBALTA) 60 MG capsule Take 60 mg by mouth 2 (two) times daily.     [name]. gemfibrozil (LOPID) 600 MG tablet Take 600 mg by mouth 2 (two) times daily.  3  . Insulin Pen Needle (B-D UF III MINI PEN NEEDLES) 31G X 5 MM MISC once daily.    . Iron-Vitamin C (VITRON-C) 65-125 MG TABS Take 1 tablet by mouth daily.     [name]. JANUMET 50-1000 MG per tablet Take 1 tablet by mouth 2 (two) times daily with a meal.  3  . levothyroxine (SYNTHROID, LEVOTHROID) 100 MCG tablet Take 100 mcg by mouth daily.  3  . lisinopril (PRINIVIL,ZESTRIL) 10 MG tablet Take 10 mg by mouth daily.     . Magnesium 500 MG TABS Take 500 mg by mouth daily.    . Multiple Vitamin (MULTIVITAMIN WITH MINERALS) TABS tablet Take 1 tablet by mouth daily.    . Omega-3 Fatty Acids (FISH OIL) 1000 MG CAPS Take 1,000 mg by mouth daily.    . pantoprazole (PROTONIX) 40 MG tablet Take 40 mg by mouth daily.     .[name]  ranitidine (ZANTAC) 300 MG capsule Take 300 mg by mouth at bedtime.  1  . TRESIBA FLEXTOUCH 200 UNIT/ML SOPN INJECT 30 UNITS SUB-Q NIGHTLY  5  . vitamin B-12 (CYANOCOBALAMIN) 1000 MCG tablet Take 1,000 mcg by mouth daily.    . vitamin E 400 UNIT capsule Take 400 Units by mouth daily.    [name] zolpidem (AMBIEN) 10 MG tablet Take 10 mg by mouth at bedtime.  5   Current Facility-Administered Medications on File Prior to Visit  Medication Dose Route Frequency Provider Last Rate  Last Dose  . Bevacizumab (AVASTIN) SOLN 1.25 mg  1.25 mg Intravitreal  [name], MD   1.25 mg at 02/10/17 0953  . Bevacizumab (AVASTIN) SOLN 1.25 mg  1.25 mg Intravitreal  [name], MD   1.25 mg at 03/13/17 1717  . Bevacizumab (AVASTIN) SOLN 1.25 mg  1.25 mg Intravitreal  [name], MD   1.25 mg at 04/11/17 1626   There are no Patient Instructions on file for this visit. No follow-ups on file.  [facility] A Trajon Rosete, PA-C

## 2017-05-03 DIAGNOSIS — Z0000 Encounter for general adult medical examination without abnormal findings: Secondary | ICD-10-CM | POA: Insufficient documentation

## 2017-05-19 NOTE — Progress Notes (Signed)
[facility] - Clinic Note  05/22/2017     CHIEF COMPLAINT Patient presents for Retina Follow Up   HISTORY OF PRESENT ILLNESS: Cheryl Leonard is a 72 y.o. female who presents to the clinic today for:   HPI    Retina Follow Up    Patient presents with  Other.  In right eye.  Severity is moderate.  Duration of 1 month.  Since onset it is stable.  I, the attending physician,  performed the HPI with the patient and updated documentation appropriately.          Comments    Pt presents for floaters OD, pt states VA OD has gotten worse since last visit, states her +2.50 readers are not working anymore, but states VA OS is stable, pt states she is not seeing any flashes, floaters, or wavy vision, pt denies pain, pt is using OTC gtts, pts BS was 116 this AM       Last edited by [name], MD on 05/22/2017  3:31 PM. (History)     Referring physician: Danella Penton, MD [id number] [facility] [address] [facility] [place], [place] [address]  HISTORICAL INFORMATION:   Selected notes from the MEDICAL RECORD NUMBER Referred by Dr. Senaida Ores for concern of DME OD;  LEE- 02.05.19 [BCVA OD: 20/70-1 OS: 20/30-1] Ocular Hx-  PMH- DM Lab Results  Component Value Date   HGBA1C 5.9 (H) 10/29/2015       CURRENT MEDICATIONS: No current outpatient medications on file. (Ophthalmic Drugs)   No current facility-administered medications for this visit.  (Ophthalmic Drugs)   Current Outpatient Medications (Other)  Medication Sig  . ALPRAZolam (XANAX) 0.25 MG tablet Take 0.25 mg by mouth at bedtime as needed for sleep.   [name] aspirin EC 81 MG tablet Take 81 mg by mouth daily.   [name] atorvastatin (LIPITOR) 20 MG tablet Take 20 mg by mouth at bedtime.  [name] buPROPion (WELLBUTRIN XL) 150 MG 24 hr tablet Take 150 mg by mouth daily. Take with 300 mg to equal 450 mg once daily  . buPROPion (WELLBUTRIN XL) 300 MG 24 hr tablet Take 300 mg by mouth daily. Take with 150 mg to  equal 450 mg once daily  . BYSTOLIC 5 MG tablet Take 5 mg by mouth daily.  . cholecalciferol (VITAMIN D) 1000 units tablet Take 1,000 Units by mouth 2 (two) times daily.  [name] CINNAMON PO Take 1 tablet by mouth daily.  . clopidogrel (PLAVIX) 75 MG tablet Take 1 tablet (75 mg total) by mouth daily.  [name] CORAL CALCIUM PO Take 1 tablet by mouth daily.  [name] denosumab (PROLIA) 60 MG/ML SOLN injection Inject into the skin.  . DULoxetine (CYMBALTA) 60 MG capsule Take 60 mg by mouth 2 (two) times daily.   [name] gemfibrozil (LOPID) 600 MG tablet Take 600 mg by mouth 2 (two) times daily.  . Insulin Pen Needle (B-D UF III MINI PEN NEEDLES) 31G X 5 MM MISC once daily.  . Iron-Vitamin C (VITRON-C) 65-125 MG TABS Take 1 tablet by mouth daily.   [name] JANUMET 50-1000 MG per tablet Take 1 tablet by mouth 2 (two) times daily with a meal.  . levothyroxine (SYNTHROID, LEVOTHROID) 100 MCG tablet Take 100 mcg by mouth daily.  [name] lisinopril (PRINIVIL,ZESTRIL) 10 MG tablet Take 10 mg by mouth daily.   . Magnesium 500 MG TABS Take 500 mg by mouth daily.  . Multiple Vitamin (MULTIVITAMIN WITH MINERALS) TABS tablet Take 1  tablet by mouth daily.  . Omega-3 Fatty Acids (FISH OIL) 1000 MG CAPS Take 1,000 mg by mouth daily.  . pantoprazole (PROTONIX) 40 MG tablet Take 40 mg by mouth daily.   . ranitidine (ZANTAC) 300 MG capsule Take 300 mg by mouth at bedtime.  . TRESIBA FLEXTOUCH 200 UNIT/ML SOPN INJECT 30 UNITS SUB-Q NIGHTLY  . vitamin B-12 (CYANOCOBALAMIN) 1000 MCG tablet Take 1,000 mcg by mouth daily.  . vitamin E 400 UNIT capsule Take 400 Units by mouth daily.  [name] zolpidem (AMBIEN) 10 MG tablet Take 10 mg by mouth at bedtime.   Current Facility-Administered Medications (Other)  Medication Route  . Bevacizumab (AVASTIN) SOLN 1.25 mg Intravitreal  . Bevacizumab (AVASTIN) SOLN 1.25 mg Intravitreal  . Bevacizumab (AVASTIN) SOLN 1.25 mg Intravitreal      REVIEW OF SYSTEMS: ROS    Positive for: Musculoskeletal, Endocrine,  Cardiovascular, Eyes, Psychiatric   Negative for: Constitutional, Gastrointestinal, Neurological, Skin, Genitourinary, HENT, Respiratory, Allergic/Imm, Heme/Lymph   Last edited by Posey Boyer, COT on 05/22/2017  2:23 PM. (History)       ALLERGIES No Known Allergies  PAST MEDICAL HISTORY Past Medical History:  Diagnosis Date  . Anxiety   . Arthritis   . Cataracts, both eyes   . GERD (gastroesophageal reflux disease)   . Gout   . History of fracture of patella    right knee  . History of positive PPD    Patient always shows positive  . Hyperlipidemia   . Hypertension   . Hypothyroidism   . Lichen sclerosus 12/30/2013   of vulva  . Metatarsal fracture   . Neuropathy   . Peripheral vascular disease (HCC)   . Polyneuropathy    numbness and tingling in feet and toes  . Type 2 diabetes mellitus, uncontrolled (HCC)    Past Surgical History:  Procedure Laterality Date  . APPENDECTOMY    . BREAST REDUCTION SURGERY  2001  . CATARACT EXTRACTION    . CESAREAN SECTION  1976  . COLONOSCOPY  03/05/2013   Nml - due for repeat 03/06/2018  . DILATION AND CURETTAGE OF UTERUS  1989  . ESOPHAGOGASTRODUODENOSCOPY  03/05/2013  . EYE SURGERY    . Eyelid Surgery  2012  . INTRAMEDULLARY (IM) NAIL INTERTROCHANTERIC Left 10/30/2015   Procedure: INTRAMEDULLARY (IM) NAIL INTERTROCHANTRIC ;  Surgeon: Kennedy Bucker, MD;  Location: [facility] ORS;  Service: Orthopedics;  Laterality: Left;  . LAPAROSCOPIC HYSTERECTOMY  2000   total  . LOWER EXTREMITY ANGIOGRAPHY Left 03/08/2017   Procedure: LOWER EXTREMITY ANGIOGRAPHY;  Surgeon: Annice Needy, MD;  Location: [facility] INVASIVE CV LAB;  Service: Cardiovascular;  Laterality: Left;  . REDUCTION MAMMAPLASTY  1997    FAMILY HISTORY Family History  Problem Relation Age of Onset  . Coronary artery disease Father   . Heart attack Father   . Coronary artery disease Mother   . Heart attack Mother   . Ovarian cancer Sister 72       sister had hormonal therapy for  IVF txs-which increased risk factor for ovarian cancer  . Breast cancer Neg Hx     SOCIAL HISTORY Social History   Tobacco Use  . Smoking status: Former Smoker    Packs/day: 1.00    Years: 20.00    Pack years: 20.00    Types: Cigarettes    Last attempt to quit: 03/07/1996    Years since quitting: 21.2  . Smokeless tobacco: Never Used  . Tobacco comment: started smoking at age 63  Substance Use Topics  . Alcohol use: No    Alcohol/week: 0.0 oz  . Drug use: No         OPHTHALMIC EXAM:  Base Eye Exam    Visual Acuity (Snellen - Linear)      Right Left   Dist [place] 20/50 -2 20/20 -2   Dist ph [place] NI 20/20 -1       Tonometry (Tonopen, 2:28 PM)      Right Left   Pressure 22 18       Tonometry #2      Right Left   Pressure 18        Pupils      Dark Light Shape React APD   Right 3 2 Round Brisk None   Left 3 2 Round Brisk None       Visual Fields (Counting fingers)      Left Right    Full Full       Extraocular Movement      Right Left    Full, Ortho Full, Ortho       Neuro/Psych    Oriented x3:  Yes   Mood/Affect:  Normal       Dilation    Both eyes:  1.0% Mydriacyl, 2.5% Phenylephrine @ 2:28 PM        Slit Lamp and Fundus Exam    External Exam      Right Left   External Normal Normal       Slit Lamp Exam      Right Left   Lids/Lashes dermatochalasis dermatochalasis   Conjunctiva/Sclera White and quiet White and quiet   Cornea arcus; well healed cataract wound; 1-2+ Punctate epithelial erosions, irregular epi surface arcus; well healed cataract wound, 2-3+ Punctate epithelial erosions, irregualr epi surface    Anterior Chamber deep deep   Iris round; mod dilated round; mod dilated   Lens PCIOL; open PC PCIOL; open PC   Vitreous syneresis, Posterior vitreous detachment, floaters inferiorly syneresis, Posterior vitreous detachment       Fundus Exam      Right Left   Disc Disc edema with superior hemorrhage Normal   C/D Ratio 0.4 0.5    Macula Blunted foveal reflex, persistent Cystic changes, scattered Microaneurysms, Epiretinal membrane flat; no heme or edema   Vessels mildly tortuous; dilated superior arcade venules; mild CWS along sup temp arcade; AV crossing changes mild tortuosity; AV crossing changes   Periphery attached; 360 MAs and dot hemes attached          IMAGING AND PROCEDURES  Imaging and Procedures for 04/25/17  OCT, Retina - OU - Both Eyes       Right Eye Quality was good. Central Foveal Thickness: 421. Progression has worsened. Findings include abnormal foveal contour, epiretinal membrane, intraretinal fluid, no SRF.   Left Eye Quality was good. Central Foveal Thickness: 290. Progression has been stable. Findings include normal foveal contour, no IRF, no SRF.   Notes *Images captured and stored on drive  Diagnosis / Impression:  CME OD -- mild interval increase NFP; no IRF/SRF OS  Clinical management:  See below  Abbreviations: NFP - Normal foveal profile. CME - cystoid macular edema. PED - pigment epithelial detachment. IRF - intraretinal fluid. SRF - subretinal fluid. EZ - ellipsoid zone. ERM - epiretinal membrane. ORA - outer retinal atrophy. ORT - outer retinal tubulation. SRHM - subretinal hyper-reflective material  ASSESSMENT/PLAN:    ICD-10-CM   1. Branch retinal vein occlusion of right eye with macular edema H34.8310   2. Retinal edema H35.81 OCT, Retina - OU - Both Eyes  3. Both eyes affected by mild nonproliferative diabetic retinopathy with macular edema, associated with type 2 diabetes mellitus (HCC) W09.8119   4. Essential hypertension I10   5. Hypertensive retinopathy of both eyes H35.033   6. Epiretinal membrane (ERM) of right eye H35.371   7. Pseudophakia of both eyes Z96.1     1,2. CME OD -- likely remote BRVO OD - by history, pt states symptoms first noticed 2 wks prior to presentation, but reports changes may have occurred prior - initial exam  with differential tortuosity of vessels (OD > OS) - FA on 2.8.19 (initial visit) with petaloid leakage and hyperfluorescence of the disc - differential includes DM2 (DME), hypertensive retinopathy, inflammatory etiology / uveitis - S/P IVA OD #1 (02.08.19), #2 (03.11.19), #3 (04.09.19) - OCT today 5.20.19 w/ interval increase in CME OD - BCVA stable at 20/50-2 OD today - recommend IVA #4 OD today (05.20.19) - RBA of procedure discussed, questions answered - informed consent obtained and signed - see procedure note - F/U 4 weeks DFE/OCT/possible injection/possible repeat FA  4. Mild nonproliferative diabetic retinopathy, both eyes - The incidence, risk factors for progression, natural history and treatment options for diabetic retinopathy were discussed with patient.   - The need for close monitoring of blood glucose, blood pressure, and serum lipids, avoiding cigarette or any type of tobacco, and the need for long term follow up was also discussed with patient. - could be contributing to CME OD - however OS with minimal diabetic retinopathy - continue to monitor  5, 6. Hypertensive retinopathy OU - stable - as above, may be contributing to CME OD - discussed importance of tight BP control - monitor  7. Epiretinal membrane, right eye  The natural history, anatomy, potential for loss of vision, and treatment options including vitrectomy techniques and the complications of endophthalmitis, retinal detachment, vitreous hemorrhage, cataract progression and permanent vision loss discussed with the patient. - stable - no indication for surgery at this time  8. Pseudophakia OU - s/p CE/IOL OU by cataract surgeon in [facility] - doing well - monitor  Ophthalmic Meds Ordered this visit:  No orders of the defined types were placed in this encounter.      No follow-ups on file.  There are no Patient Instructions on file for this visit.   Explained the diagnoses, plan, and follow up with the  patient and they expressed understanding.  Patient expressed understanding of the importance of proper follow up care.   This document serves as a record of services personally performed by Karie Chimera, MD, PhD. It was created on their behalf by Laurian Brim, OA, an ophthalmic assistant. The creation of this record is the provider's dictation and/or activities during the visit.    Electronically signed by: Laurian Brim, OA  05/19/2017 3:33 PM   This document serves as a record of services personally performed by Karie Chimera, MD, PhD. It was created on their behalf by Virgilio Belling, COA, a certified ophthalmic assistant. The creation of this record is the provider's dictation and/or activities during the visit.  Electronically signed by: Virgilio Belling, COA 05.20.19 3:33 PM    Karie Chimera, M.D., Ph.D. Diseases & Surgery of the Retina and Vitreous [facility] 05/22/17   I have reviewed the  above documentation for accuracy and completeness, and I agree with the above. Karie Chimera, M.D., Ph.D. 05/22/17 3:33 PM    Abbreviations: M myopia (nearsighted); A astigmatism; H hyperopia (farsighted); P presbyopia; Mrx spectacle prescription;  CTL contact lenses; OD right eye; OS left eye; OU both eyes  XT exotropia; ET esotropia; PEK punctate epithelial keratitis; PEE punctate epithelial erosions; DES dry eye syndrome; MGD meibomian gland dysfunction; ATs artificial tears; PFAT's preservative free artificial tears; NSC nuclear sclerotic cataract; PSC posterior subcapsular cataract; ERM epi-retinal membrane; PVD posterior vitreous detachment; RD retinal detachment; DM diabetes mellitus; DR diabetic retinopathy; NPDR non-proliferative diabetic retinopathy; PDR proliferative diabetic retinopathy; CSME clinically significant macular edema; DME diabetic macular edema; dbh dot blot hemorrhages; CWS cotton wool spot; POAG primary open angle glaucoma; C/D cup-to-disc  ratio; HVF humphrey visual field; GVF goldmann visual field; OCT optical coherence tomography; IOP intraocular pressure; BRVO Branch retinal vein occlusion; CRVO central retinal vein occlusion; CRAO central retinal artery occlusion; BRAO branch retinal artery occlusion; RT retinal tear; SB scleral buckle; PPV pars plana vitrectomy; VH Vitreous hemorrhage; PRP panretinal laser photocoagulation; IVK intravitreal kenalog; VMT vitreomacular traction; MH Macular hole;  NVD neovascularization of the disc; NVE neovascularization elsewhere; AREDS age related eye disease study; ARMD age related macular degeneration; POAG primary open angle glaucoma; EBMD epithelial/anterior basement membrane dystrophy; ACIOL anterior chamber intraocular lens; IOL intraocular lens; PCIOL posterior chamber intraocular lens; Phaco/IOL phacoemulsification with intraocular lens placement; PRK photorefractive keratectomy; LASIK laser assisted in situ keratomileusis; HTN hypertension; DM diabetes mellitus; COPD chronic obstructive pulmonary disease

## 2017-05-22 ENCOUNTER — Encounter (INDEPENDENT_AMBULATORY_CARE_PROVIDER_SITE_OTHER): Payer: Self-pay | Admitting: Ophthalmology

## 2017-05-22 ENCOUNTER — Ambulatory Visit (INDEPENDENT_AMBULATORY_CARE_PROVIDER_SITE_OTHER): Payer: Medicare Other | Admitting: Ophthalmology

## 2017-05-22 DIAGNOSIS — I10 Essential (primary) hypertension: Secondary | ICD-10-CM

## 2017-05-22 DIAGNOSIS — H3581 Retinal edema: Secondary | ICD-10-CM | POA: Diagnosis not present

## 2017-05-22 DIAGNOSIS — H35033 Hypertensive retinopathy, bilateral: Secondary | ICD-10-CM

## 2017-05-22 DIAGNOSIS — H34831 Tributary (branch) retinal vein occlusion, right eye, with macular edema: Secondary | ICD-10-CM

## 2017-05-22 DIAGNOSIS — H35371 Puckering of macula, right eye: Secondary | ICD-10-CM

## 2017-05-22 DIAGNOSIS — Z961 Presence of intraocular lens: Secondary | ICD-10-CM

## 2017-05-22 DIAGNOSIS — E113213 Type 2 diabetes mellitus with mild nonproliferative diabetic retinopathy with macular edema, bilateral: Secondary | ICD-10-CM

## 2017-06-19 ENCOUNTER — Other Ambulatory Visit: Payer: Self-pay | Admitting: Internal Medicine

## 2017-06-19 DIAGNOSIS — Z1231 Encounter for screening mammogram for malignant neoplasm of breast: Secondary | ICD-10-CM

## 2017-06-22 NOTE — Progress Notes (Signed)
[facility] - Clinic Note  06/26/2017     CHIEF COMPLAINT Patient presents for Retina Follow Up   HISTORY OF PRESENT ILLNESS: Cheryl Leonard is a 72 y.o. female who presents to the clinic today for:   HPI    Retina Follow Up    Patient presents with  CRVO/BRVO.  In right eye.  This started 3 days ago.  Severity is moderate.  Duration of 3 days.  Since onset it is rapidly worsening.  I, the attending physician,  performed the HPI with the patient and updated documentation appropriately.          Comments    Patient here for follow up of retinal vein occlusion OD. Patient reports blurred vision OD since 06/23/2017. Can't see well out of OD for reading. Driving vision seems OK with OU open. BS was 113 this am. A1c was 5.7 (3 months ago).        Last edited by [name], MD on 06/26/2017  3:31 PM. (History)     Referring physician: Danella Penton, MD [phone] [facility] [address] [facility] [place], [place] [address]  HISTORICAL INFORMATION:   Selected notes from the MEDICAL RECORD NUMBER Referred by Dr. Senaida Ores for concern of DME OD;  LEE- 02.05.19 [BCVA OD: 20/70-1 OS: 20/30-1] Ocular Hx-  PMH- DM Lab Results  Component Value Date   HGBA1C 5.9 (H) 10/29/2015       CURRENT MEDICATIONS: No current outpatient medications on file. (Ophthalmic Drugs)   Current Facility-Administered Medications (Ophthalmic Drugs)  Medication Route  . aflibercept (EYLEA) SOLN 2 mg Intravitreal   Current Outpatient Medications (Other)  Medication Sig  . ALPRAZolam (XANAX) 0.25 MG tablet Take 0.25 mg by mouth at bedtime as needed for sleep.   [name] aspirin EC 81 MG tablet Take 81 mg by mouth daily.   [name] atorvastatin (LIPITOR) 20 MG tablet Take 20 mg by mouth at bedtime.  [name] buPROPion (WELLBUTRIN XL) 150 MG 24 hr tablet Take 150 mg by mouth daily. Take with 300 mg to equal 450 mg once daily  . buPROPion (WELLBUTRIN XL) 300 MG 24 hr tablet Take 300 mg by  mouth daily. Take with 150 mg to equal 450 mg once daily  . BYSTOLIC 5 MG tablet Take 5 mg by mouth daily.  . cholecalciferol (VITAMIN D) 1000 units tablet Take 1,000 Units by mouth 2 (two) times daily.  [name] CINNAMON PO Take 1 tablet by mouth daily.  . clopidogrel (PLAVIX) 75 MG tablet Take 1 tablet (75 mg total) by mouth daily.  [name] CORAL CALCIUM PO Take 1 tablet by mouth daily.  [name] denosumab (PROLIA) 60 MG/ML SOLN injection Inject into the skin.  . DULoxetine (CYMBALTA) 60 MG capsule Take 60 mg by mouth 2 (two) times daily.   [name] gemfibrozil (LOPID) 600 MG tablet Take 600 mg by mouth 2 (two) times daily.  . Insulin Pen Needle (B-D UF III MINI PEN NEEDLES) 31G X 5 MM MISC once daily.  . Iron-Vitamin C (VITRON-C) 65-125 MG TABS Take 1 tablet by mouth daily.   [name] JANUMET 50-1000 MG per tablet Take 1 tablet by mouth 2 (two) times daily with a meal.  . levothyroxine (SYNTHROID, LEVOTHROID) 100 MCG tablet Take 100 mcg by mouth daily.  [name] lisinopril (PRINIVIL,ZESTRIL) 10 MG tablet Take 10 mg by mouth daily.   . Magnesium 500 MG TABS Take 500 mg by mouth daily.  . Multiple Vitamin (MULTIVITAMIN WITH MINERALS) TABS tablet  Take 1 tablet by mouth daily.  . Omega-3 Fatty Acids (FISH OIL) 1000 MG CAPS Take 1,000 mg by mouth daily.  . pantoprazole (PROTONIX) 40 MG tablet Take 40 mg by mouth daily.   . ranitidine (ZANTAC) 300 MG capsule Take 300 mg by mouth at bedtime.  . TRESIBA FLEXTOUCH 200 UNIT/ML SOPN INJECT 30 UNITS SUB-Q NIGHTLY  . vitamin B-12 (CYANOCOBALAMIN) 1000 MCG tablet Take 1,000 mcg by mouth daily.  . vitamin E 400 UNIT capsule Take 400 Units by mouth daily.  [name]. zolpidem (AMBIEN) 10 MG tablet Take 10 mg by mouth at bedtime.   Current Facility-Administered Medications (Other)  Medication Route  . Bevacizumab (AVASTIN) SOLN 1.25 mg Intravitreal  . Bevacizumab (AVASTIN) SOLN 1.25 mg Intravitreal  . Bevacizumab (AVASTIN) SOLN 1.25 mg Intravitreal      REVIEW OF SYSTEMS: ROS    Positive for:  Musculoskeletal, Endocrine, Cardiovascular, Eyes, Psychiatric   Negative for: Constitutional, Gastrointestinal, Neurological, Skin, Genitourinary, HENT, Respiratory, Allergic/Imm, Heme/Lymph   Last edited by Annalee GentaBarber, Daryl D on 06/26/2017  2:14 PM. (History)       ALLERGIES No Known Allergies  PAST MEDICAL HISTORY Past Medical History:  Diagnosis Date  . Anxiety   . Arthritis   . Cataracts, both eyes   . GERD (gastroesophageal reflux disease)   . Gout   . History of fracture of patella    right knee  . History of positive PPD    Patient always shows positive  . Hyperlipidemia   . Hypertension   . Hypothyroidism   . Lichen sclerosus 12/30/2013   of vulva  . Metatarsal fracture   . Neuropathy   . Peripheral vascular disease (HCC)   . Polyneuropathy    numbness and tingling in feet and toes  . Type 2 diabetes mellitus, uncontrolled (HCC)    Past Surgical History:  Procedure Laterality Date  . APPENDECTOMY    . BREAST REDUCTION SURGERY  2001  . CATARACT EXTRACTION    . CESAREAN SECTION  1976  . COLONOSCOPY  03/05/2013   Nml - due for repeat 03/06/2018  . DILATION AND CURETTAGE OF UTERUS  1989  . ESOPHAGOGASTRODUODENOSCOPY  03/05/2013  . EYE SURGERY    . Eyelid Surgery  2012  . INTRAMEDULLARY (IM) NAIL INTERTROCHANTERIC Left 10/30/2015   Procedure: INTRAMEDULLARY (IM) NAIL INTERTROCHANTRIC ;  Surgeon: Kennedy BuckerMichael Menz, MD;  Location: [facility] ORS;  Service: Orthopedics;  Laterality: Left;  . LAPAROSCOPIC HYSTERECTOMY  2000   total  . LOWER EXTREMITY ANGIOGRAPHY Left 03/08/2017   Procedure: LOWER EXTREMITY ANGIOGRAPHY;  Surgeon: Annice Needyew, Jason S, MD;  Location: [facility] INVASIVE CV LAB;  Service: Cardiovascular;  Laterality: Left;  . REDUCTION MAMMAPLASTY  1997    FAMILY HISTORY Family History  Problem Relation Age of Onset  . Coronary artery disease Father   . Heart attack Father   . Coronary artery disease Mother   . Heart attack Mother   . Ovarian cancer Sister [id number]       sister had  hormonal therapy for IVF txs-which increased risk factor for ovarian cancer  . Breast cancer Neg Hx     SOCIAL HISTORY Social History   Tobacco Use  . Smoking status: Former Smoker    Packs/day: 1.00    Years: 20.00    Pack years: 20.00    Types: Cigarettes    Last attempt to quit: 03/07/1996    Years since quitting: 21.3  . Smokeless tobacco: Never Used  . Tobacco comment: started smoking at age  12  Substance Use Topics  . Alcohol use: No    Alcohol/week: 0.0 oz  . Drug use: No         OPHTHALMIC EXAM:  Base Eye Exam    Visual Acuity (Snellen - Linear)      Right Left   Dist [place] 20/70 -2 20/20 -1   Dist ph [place] NI        Tonometry (Tonopen, 2:28 PM)      Right Left   Pressure 17 18       Pupils      Dark Light Shape React APD   Right 3 2.5 Round Minimal +2   Left 3 2 Round Slow None       Visual Fields (Counting fingers)      Left Right    Full Full       Extraocular Movement      Right Left    Full, Ortho Full, Ortho       Neuro/Psych    Oriented x3:  Yes   Mood/Affect:  Normal       Dilation    Both eyes:  1.0% Mydriacyl, 2.5% Phenylephrine @ 2:28 PM        Slit Lamp and Fundus Exam    External Exam      Right Left   External Normal Normal       Slit Lamp Exam      Right Left   Lids/Lashes dermatochalasis dermatochalasis   Conjunctiva/Sclera White and quiet White and quiet   Cornea arcus; well healed cataract wound; 1-2+ Punctate epithelial erosions, irregular epi surface, decreased TBUT arcus; well healed cataract wound, 1+ Punctate epithelial erosions, irregualr epi surface, decreased TBUT   Anterior Chamber Deep and quiet Deep and quiet   Iris Round and dilated Round and dilated   Lens PCIOL; open PC PCIOL; open PC   Vitreous syneresis, Posterior vitreous detachment, floaters inferiorly, trace-1+ cell syneresis, Posterior vitreous detachment       Fundus Exam      Right Left   Disc 1-2+ sectoral disc edema superior hemisphere with  flame hemorrhages Normal   C/D Ratio 0.4 0.5   Macula Blunted foveal reflex, persistent Cystic changes, scattered Microaneurysms, Epiretinal membrane flat; no heme or edema   Vessels mildly tortuous; dilated superior arcade venules; mild CWS along sup temp arcade; AV crossing changes mild tortuosity; AV crossing changes   Periphery attached; 360 MAs, scattered DBH superiorly, 360 dot hemorrhages attached        Refraction    Manifest Refraction      Sphere Cylinder Axis Dist VA   Right +0.25 +1.00 180 20/70   Left              IMAGING AND PROCEDURES  Imaging and Procedures for 04/25/17  OCT, Retina - OU - Both Eyes       Right Eye Quality was good. Central Foveal Thickness: 530. Progression has worsened. Findings include abnormal foveal contour, epiretinal membrane, intraretinal fluid, no SRF.   Left Eye Quality was good. Central Foveal Thickness: 290. Progression has been stable. Findings include normal foveal contour, no IRF, no SRF.   Notes *Images captured and stored on drive  Diagnosis / Impression:  CME OD -- interval increase NFP; no IRF/SRF OS  Clinical management:  See below  Abbreviations: NFP - Normal foveal profile. CME - cystoid macular edema. PED - pigment epithelial detachment. IRF - intraretinal fluid. SRF - subretinal fluid. EZ - ellipsoid zone. ERM -  epiretinal membrane. ORA - outer retinal atrophy. ORT - outer retinal tubulation. SRHM - subretinal hyper-reflective material        Fluorescein Angiography Optos (Transit OD)       Right Eye Progression has improved. Early phase findings include leakage, microaneurysm, delayed filling. Mid/Late phase findings include leakage, microaneurysm, vascular perfusion defect.   Left Eye Progression has been stable. Early phase findings include microaneurysm. Mid/Late phase findings include microaneurysm.   Notes *Images stored on disc;   Impression:  OD: petaloid hyperfluorescence in central macula --  improved; hyper fluorescence of disc, mild peripheral MAs with peripheral leakage late; mild peripheral capillary non-perfusion; OS: rare MAs with minimal leakage        Intravitreal Injection, Pharmacologic Agent - OD - Right Eye       Time Out 06/26/2017. 4:16 PM. Confirmed correct patient, procedure, site, and patient consented.   Anesthesia Topical anesthesia was used. Anesthetic medications included Lidocaine 2%, Tetracaine 0.5%.   Procedure Preparation included 5% betadine to ocular surface, eyelid speculum. A supplied needle was used.   Injection: 2 mg aflibercept 2 MG/0.05ML   NDC: 16109-604-54    Lot: [id number]    Expiration Date: 01/01/2018   Route: Intravitreal   Site: Right Eye   Waste: 0.05 mg  Post-op Post injection exam found visual acuity of at least counting fingers. The patient tolerated the procedure well. There were no complications. The patient received written and verbal post procedure care education.   Notes **sample medication given**                ASSESSMENT/PLAN:    ICD-10-CM   1. Branch retinal vein occlusion of right eye with macular edema H34.8310 OCT, Retina - OU - Both Eyes    Fluorescein Angiography Optos (Transit OD)    Intravitreal Injection, Pharmacologic Agent - OD - Right Eye    CT ORBITS W CONTRAST    aflibercept (EYLEA) SOLN 2 mg  2. Retinal edema H35.81 OCT, Retina - OU - Both Eyes    Fluorescein Angiography Optos (Transit OD)  3. Both eyes affected by mild nonproliferative diabetic retinopathy with macular edema, associated with type 2 diabetes mellitus (HCC) G95.6213   4. Essential hypertension I10   5. Hypertensive retinopathy of both eyes H35.033   6. Epiretinal membrane (ERM) of right eye H35.371   7. Pseudophakia of both eyes Z96.1   8. Floaters in visual field, right H43.391   9. 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 CME OD -- likely remote BRVO OD -- reactivated - by history,  pt states symptoms first noticed 2 wks prior to presentation, but reports changes may have occurred prior - initial exam with differential tortuosity of vessels (OD > OS) - FA on 2.8.19 (initial visit) with petaloid leakage and hyperfluorescence of the disc - differential includes DM2 (DME), hypertensive retinopathy, inflammatory etiology / uveitis - S/P IVA OD #1 (02.08.19), #2 (03.11.19), #3 (04.09.19), #4 (05.20.19) - review of OCTs show interval increase in CME OD after initial improvement post IVA #1 -- possible resistance to IVA  - also newly developed sectoral disc edema present today raising concern for possible carotid stenosis or retro-orbital mass effect (see below) - will dictate letter to vascular PA and Dr. Wyn Quaker - BCVA dec to 20/70 OD today - discussed switching to [facility] - recommend IVE #1 OD today (06.24.19) -- sample given today - RBA of procedure discussed, questions answered - informed consent  obtained and signed - see procedure note - will run benefits investigation for Eylea, pt signed paperwork on 06.24.19 - F/U 4 weeks DFE/OCT/possible injection/possible repeat FA  3. Mild nonproliferative diabetic retinopathy, both eyes - The incidence, risk factors for progression, natural history and treatment options for diabetic retinopathy were discussed with patient.   - The need for close monitoring of blood glucose, blood pressure, and serum lipids, avoiding cigarette or any type of tobacco, and the need for long term follow up was also discussed with patient. - could be contributing to CME OD - however OS with minimal diabetic retinopathy - continue to monitor  4,5. Hypertensive retinopathy OU - stable - as above, may be contributing to CME OD - discussed importance of tight BP control - monitor  6. Epiretinal membrane, right eye  The natural history, anatomy, potential for loss of vision, and treatment options including vitrectomy techniques and the complications of  endophthalmitis, retinal detachment, vitreous hemorrhage, cataract progression and permanent vision loss discussed with the patient. - stable - no indication for surgery at this time  7. Pseudophakia OU - s/p CE/IOL OU by cataract surgeon in [facility] - doing well - monitor  8. Optic disc edema OD -- sectoral - likely secondary to BRVO but differential includes carotid stenosis and retro-orbital mass - history of blood clots - recommend CT orbits w/ contrast to r/o retro-orbital mass - recommend carotid dopplers to r/o stenosis / occlusion - will dictate letter to vascular PA and Dr. Wyn Quaker  Ophthalmic Meds Ordered this visit:  Meds ordered this encounter  Medications  . aflibercept (EYLEA) SOLN 2 mg       Return in about 1 month (around 07/24/2017) for F/U CME OD, DFE, OCT, poss FA.  There are no Patient Instructions on file for this visit.   Explained the diagnoses, plan, and follow up with the patient and they expressed understanding.  Patient expressed understanding of the importance of proper follow up care.   This document serves as a record of services personally performed by Karie Chimera, MD, PhD. It was created on their behalf by Virgilio Belling, COA, a certified ophthalmic assistant. The creation of this record is the provider's dictation and/or activities during the visit.  Electronically signed by: Virgilio Belling, COA  06.20.19 1:18 AM   Karie Chimera, M.D., Ph.D. Diseases & Surgery of the Retina and Vitreous [facility]  I have reviewed the above documentation for accuracy and completeness, and I agree with the above. Karie Chimera, M.D., Ph.D. 06/28/17 1:37 AM    Abbreviations: M myopia (nearsighted); A astigmatism; H hyperopia (farsighted); P presbyopia; Mrx spectacle prescription;  CTL contact lenses; OD right eye; OS left eye; OU both eyes  XT exotropia; ET esotropia; PEK punctate epithelial keratitis; PEE punctate epithelial erosions;  DES dry eye syndrome; MGD meibomian gland dysfunction; ATs artificial tears; PFAT's preservative free artificial tears; NSC nuclear sclerotic cataract; PSC posterior subcapsular cataract; ERM epi-retinal membrane; PVD posterior vitreous detachment; RD retinal detachment; DM diabetes mellitus; DR diabetic retinopathy; NPDR non-proliferative diabetic retinopathy; PDR proliferative diabetic retinopathy; CSME clinically significant macular edema; DME diabetic macular edema; dbh dot blot hemorrhages; CWS cotton wool spot; POAG primary open angle glaucoma; C/D cup-to-disc ratio; HVF humphrey visual field; GVF goldmann visual field; OCT optical coherence tomography; IOP intraocular pressure; BRVO Branch retinal vein occlusion; CRVO central retinal vein occlusion; CRAO central retinal artery occlusion; BRAO branch retinal artery occlusion; RT retinal tear; SB scleral buckle; PPV  pars plana vitrectomy; VH Vitreous hemorrhage; PRP panretinal laser photocoagulation; IVK intravitreal kenalog; VMT vitreomacular traction; MH Macular hole;  NVD neovascularization of the disc; NVE neovascularization elsewhere; AREDS age related eye disease study; ARMD age related macular degeneration; POAG primary open angle glaucoma; EBMD epithelial/anterior basement membrane dystrophy; ACIOL anterior chamber intraocular lens; IOL intraocular lens; PCIOL posterior chamber intraocular lens; Phaco/IOL phacoemulsification with intraocular lens placement; [place] photorefractive keratectomy; LASIK laser assisted in situ keratomileusis; HTN hypertension; DM diabetes mellitus; COPD chronic obstructive pulmonary disease

## 2017-06-26 ENCOUNTER — Ambulatory Visit (INDEPENDENT_AMBULATORY_CARE_PROVIDER_SITE_OTHER): Payer: Medicare Other | Admitting: Ophthalmology

## 2017-06-26 ENCOUNTER — Encounter (INDEPENDENT_AMBULATORY_CARE_PROVIDER_SITE_OTHER): Payer: Self-pay | Admitting: Ophthalmology

## 2017-06-26 DIAGNOSIS — H35033 Hypertensive retinopathy, bilateral: Secondary | ICD-10-CM | POA: Diagnosis not present

## 2017-06-26 DIAGNOSIS — H34831 Tributary (branch) retinal vein occlusion, right eye, with macular edema: Secondary | ICD-10-CM | POA: Diagnosis not present

## 2017-06-26 DIAGNOSIS — H35371 Puckering of macula, right eye: Secondary | ICD-10-CM | POA: Diagnosis not present

## 2017-06-26 DIAGNOSIS — E113213 Type 2 diabetes mellitus with mild nonproliferative diabetic retinopathy with macular edema, bilateral: Secondary | ICD-10-CM | POA: Diagnosis not present

## 2017-06-26 DIAGNOSIS — H4710 Unspecified papilledema: Secondary | ICD-10-CM | POA: Diagnosis not present

## 2017-06-26 DIAGNOSIS — I10 Essential (primary) hypertension: Secondary | ICD-10-CM

## 2017-06-26 DIAGNOSIS — H3581 Retinal edema: Secondary | ICD-10-CM

## 2017-06-26 DIAGNOSIS — Z961 Presence of intraocular lens: Secondary | ICD-10-CM | POA: Diagnosis not present

## 2017-06-27 ENCOUNTER — Encounter (INDEPENDENT_AMBULATORY_CARE_PROVIDER_SITE_OTHER): Payer: Self-pay | Admitting: Ophthalmology

## 2017-06-27 MED ORDER — AFLIBERCEPT 2MG/0.05ML IZ SOLN FOR KALEIDOSCOPE
2.0000 mg | INTRAVITREAL | Status: DC
  Administered 2017-06-27: 2 mg via INTRAVITREAL

## 2017-06-28 ENCOUNTER — Telehealth (INDEPENDENT_AMBULATORY_CARE_PROVIDER_SITE_OTHER): Payer: Self-pay | Admitting: Vascular Surgery

## 2017-06-28 ENCOUNTER — Other Ambulatory Visit (INDEPENDENT_AMBULATORY_CARE_PROVIDER_SITE_OTHER): Payer: Self-pay

## 2017-06-28 DIAGNOSIS — I6529 Occlusion and stenosis of unspecified carotid artery: Secondary | ICD-10-CM

## 2017-06-28 NOTE — Telephone Encounter (Signed)
Received the following email. Will place an order for a carotid duplex.  Rennis ChrisZamora, Brian, MD  Reginal Wojcicki, Ranae PlumberKimberly A, PA-C        Hi there,  I am treating this mutual pt of ours for a suspected branch retinal vein occlusion of the right eye with intravitreal anti-VEGF injections. After an initial improvement following her first treatment, she has had minimal response to 3 consecutive anti-VEGF injections and most recently developed some optic disc edema. Given her history of blood clot, I am concerned about the possibility carotid stenosis/occlusion contributing to her ocular pathology. Would it be possible to get Cheryl Leonard scheduled for carotid dopplers to investigate? I have ordered at CT orbits to r/o a retro-orbital mass causing her optic disc edema. I appreciate your consideration and assistance in the care of this patient.   Thank you,  Karie ChimeraBrian G. Zamora, M.D., Ph.D.  Diseases & Surgery of the Retina and Vitreous  [facility]

## 2017-06-28 NOTE — Telephone Encounter (Signed)
-----   Message from Rennis ChrisBrian Zamora, MD sent at 06/28/2017  1:48 AM EDT ----- Hi there, I am treating this mutual pt of ours for a suspected branch retinal vein occlusion of the right eye with intravitreal anti-VEGF injections. After an initial improvement following her first treatment, she has had minimal response to 3 consecutive anti-VEGF injections and most recently developed some optic disc edema. Given her history of blood clot, I am concerned about the possibility carotid stenosis/occlusion contributing to her ocular pathology. Would it be possible to get Ms. Pezzullo scheduled for carotid dopplers to investigate? I have ordered at CT orbits to r/o a retro-orbital mass causing her optic disc edema. I appreciate your consideration and assistance in the care of this patient.  Thank you, Karie ChimeraBrian G. Zamora, M.D., Ph.D. Diseases & Surgery of the Retina and Vitreous [facility]

## 2017-06-28 NOTE — Progress Notes (Signed)
I just got this patient scheduled for the Carotid Duplex. Front office will get her scheduled to come in to follow up with you afterwards.

## 2017-07-04 ENCOUNTER — Ambulatory Visit
Admission: RE | Admit: 2017-07-04 | Discharge: 2017-07-04 | Disposition: A | Discharge status: Home / Self Care | Payer: Medicare Other | Source: Ambulatory Visit | Attending: Vascular Surgery | Admitting: Vascular Surgery

## 2017-07-04 DIAGNOSIS — I6529 Occlusion and stenosis of unspecified carotid artery: Secondary | ICD-10-CM | POA: Diagnosis present

## 2017-07-11 ENCOUNTER — Encounter (INDEPENDENT_AMBULATORY_CARE_PROVIDER_SITE_OTHER): Payer: Self-pay | Admitting: Vascular Surgery

## 2017-07-11 ENCOUNTER — Ambulatory Visit (INDEPENDENT_AMBULATORY_CARE_PROVIDER_SITE_OTHER): Payer: Medicare Other | Admitting: Vascular Surgery

## 2017-07-11 ENCOUNTER — Telehealth (INDEPENDENT_AMBULATORY_CARE_PROVIDER_SITE_OTHER): Payer: Self-pay | Admitting: Vascular Surgery

## 2017-07-11 DIAGNOSIS — I10 Essential (primary) hypertension: Secondary | ICD-10-CM | POA: Diagnosis not present

## 2017-07-11 DIAGNOSIS — E1165 Type 2 diabetes mellitus with hyperglycemia: Secondary | ICD-10-CM | POA: Diagnosis not present

## 2017-07-11 DIAGNOSIS — I6523 Occlusion and stenosis of bilateral carotid arteries: Secondary | ICD-10-CM

## 2017-07-11 DIAGNOSIS — I739 Peripheral vascular disease, unspecified: Secondary | ICD-10-CM | POA: Diagnosis not present

## 2017-07-11 DIAGNOSIS — I6529 Occlusion and stenosis of unspecified carotid artery: Secondary | ICD-10-CM

## 2017-07-11 NOTE — Progress Notes (Signed)
MRN : [id number]  Cheryl Leonard is a 72 y.o. (11/30/1945) female who presents with chief complaint of  Chief Complaint  Patient presents with  . Carotid    Ultrasound follow up  .  History of Present Illness: Patient returns in follow-up after her carotid ultrasound.  She is being treated for retinal disease and that was the reason for evaluating her carotids.  She has no new symptoms since her last visit.  Her carotid duplex shows mild carotid plaque bilaterally without hemodynamically significant stenosis on either side (stenosis of less than 40%).  Current Outpatient Medications  Medication Sig Dispense Refill  . ALPRAZolam (XANAX) 0.25 MG tablet Take 0.25 mg by mouth at bedtime as needed for sleep.     [name] aspirin EC 81 MG tablet Take 81 mg by mouth daily.     [name] atorvastatin (LIPITOR) 20 MG tablet Take 20 mg by mouth at bedtime.  3  . buPROPion (WELLBUTRIN XL) 150 MG 24 hr tablet Take 150 mg by mouth daily. Take with 300 mg to equal 450 mg once daily    . buPROPion (WELLBUTRIN XL) 300 MG 24 hr tablet Take 300 mg by mouth daily. Take with 150 mg to equal 450 mg once daily  11  . BYSTOLIC 5 MG tablet Take 5 mg by mouth daily.  2  . cholecalciferol (VITAMIN D) 1000 units tablet Take 1,000 Units by mouth 2 (two) times daily.    [name] CINNAMON PO Take 1 tablet by mouth daily.    . clopidogrel (PLAVIX) 75 MG tablet Take 1 tablet (75 mg total) by mouth daily. 30 tablet 11  . CORAL CALCIUM PO Take 1 tablet by mouth daily.    [name] denosumab (PROLIA) 60 MG/ML SOLN injection Inject into the skin.    . DULoxetine (CYMBALTA) 60 MG capsule Take 60 mg by mouth 2 (two) times daily.     [name] gemfibrozil (LOPID) 600 MG tablet Take 600 mg by mouth 2 (two) times daily.  3  . Insulin Pen Needle (B-D UF III MINI PEN NEEDLES) 31G X 5 MM MISC once daily.    . Iron-Vitamin C (VITRON-C) 65-125 MG TABS Take 1 tablet by mouth daily.     [name] JANUMET 50-1000 MG per tablet Take 1 tablet by mouth 2 (two) times daily with  a meal.  3  . levothyroxine (SYNTHROID, LEVOTHROID) 100 MCG tablet Take 100 mcg by mouth daily.  3  . lisinopril (PRINIVIL,ZESTRIL) 10 MG tablet Take 10 mg by mouth daily.     . Magnesium 500 MG TABS Take 500 mg by mouth daily.    . Multiple Vitamin (MULTIVITAMIN WITH MINERALS) TABS tablet Take 1 tablet by mouth daily.    . Omega-3 Fatty Acids (FISH OIL) 1000 MG CAPS Take 1,000 mg by mouth daily.    . pantoprazole (PROTONIX) 40 MG tablet Take 40 mg by mouth daily.     . ranitidine (ZANTAC) 300 MG capsule Take 300 mg by mouth at bedtime.  1  . TRESIBA FLEXTOUCH 200 UNIT/ML SOPN INJECT 30 UNITS SUB-Q NIGHTLY  5  . vitamin B-12 (CYANOCOBALAMIN) 1000 MCG tablet Take 1,000 mcg by mouth daily.    . vitamin E 400 UNIT capsule Take 400 Units by mouth daily.    [name] zolpidem (AMBIEN) 10 MG tablet Take 10 mg by mouth at bedtime.  5   Current Facility-Administered Medications  Medication Dose Route Frequency Provider Last Rate Last Dose  . aflibercept (EYLEA) SOLN 2  mg  2 mg Intravitreal  [name], MD   2 mg at 06/27/17 1307  . Bevacizumab (AVASTIN) SOLN 1.25 mg  1.25 mg Intravitreal  [name], MD   1.25 mg at 02/10/17 0953  . Bevacizumab (AVASTIN) SOLN 1.25 mg  1.25 mg Intravitreal  [name], MD   1.25 mg at 03/13/17 1717  . Bevacizumab (AVASTIN) SOLN 1.25 mg  1.25 mg Intravitreal  [name], MD   1.25 mg at 04/11/17 1626    Past Medical History:  Diagnosis Date  . Anxiety   . Arthritis   . Cataracts, both eyes   . GERD (gastroesophageal reflux disease)   . Gout   . History of fracture of patella    right knee  . History of positive PPD    Patient always shows positive  . Hyperlipidemia   . Hypertension   . Hypothyroidism   . Lichen sclerosus 12/30/2013   of vulva  . Metatarsal fracture   . Neuropathy   . Peripheral vascular disease (HCC)   . Polyneuropathy    numbness and tingling in feet and toes  . Type 2 diabetes mellitus, uncontrolled (HCC)     Past Surgical  History:  Procedure Laterality Date  . APPENDECTOMY    . BREAST REDUCTION SURGERY  2001  . CATARACT EXTRACTION    . CESAREAN SECTION  1976  . COLONOSCOPY  03/05/2013   Nml - due for repeat 03/06/2018  . DILATION AND CURETTAGE OF UTERUS  1989  . ESOPHAGOGASTRODUODENOSCOPY  03/05/2013  . EYE SURGERY    . Eyelid Surgery  2012  . INTRAMEDULLARY (IM) NAIL INTERTROCHANTERIC Left 10/30/2015   Procedure: INTRAMEDULLARY (IM) NAIL INTERTROCHANTRIC ;  Surgeon: Kennedy Bucker, MD;  Location: [facility] ORS;  Service: Orthopedics;  Laterality: Left;  . LAPAROSCOPIC HYSTERECTOMY  2000   total  . LOWER EXTREMITY ANGIOGRAPHY Left 03/08/2017   Procedure: LOWER EXTREMITY ANGIOGRAPHY;  Surgeon: Annice Needy, MD;  Location: [facility];  Service: Cardiovascular;  Laterality: Left;  . REDUCTION MAMMAPLASTY  1997    Social History Social History   Tobacco Use  . Smoking status: Former Smoker    Packs/day: 1.00    Years: 20.00    Pack years: 20.00    Types: Cigarettes    Last attempt to quit: 03/07/1996    Years since quitting: 21.3  . Smokeless tobacco: Never Used  . Tobacco comment: started smoking at age 59  Substance Use Topics  . Alcohol use: No    Alcohol/week: 0.0 oz  . Drug use: No     Family History Family History  Problem Relation Age of Onset  . Coronary artery disease Father   . Heart attack Father   . Coronary artery disease Mother   . Heart attack Mother   . Ovarian cancer Sister 50       sister had hormonal therapy for IVF txs-which increased risk factor for ovarian cancer  . Breast cancer Neg Hx      No Known Allergies   REVIEW OF SYSTEMS (Negative unless checked)  Constitutional: [] Weight loss  [] Fever  [] Chills Cardiac: [] Chest pain   [] Chest pressure   [] Palpitations   [] Shortness of breath when laying flat   [] Shortness of breath at rest   [] Shortness of breath with exertion. Vascular:  [] Pain in legs with walking   [] Pain in legs at rest   [] Pain in legs when laying  flat   [x] Claudication   [] Pain in feet when walking  []   Pain in feet at rest  [] Pain in feet when laying flat   [] History of DVT   [] Phlebitis   [] Swelling in legs   [] Varicose veins   [] Non-healing ulcers Pulmonary:   [] Uses home oxygen   [] Productive cough   [] Hemoptysis   [] Wheeze  [] COPD   [] Asthma Neurologic:  [] Dizziness  [] Blackouts   [] Seizures   [] History of stroke   [] History of TIA  [] Aphasia   [] Temporary blindness   [] Dysphagia   [] Weakness or numbness in arms   [] Weakness or numbness in legs Musculoskeletal:  [] Arthritis   [] Joint swelling   [] Joint pain   [] Low back pain Hematologic:  [] Easy bruising  [] Easy bleeding   [] Hypercoagulable state   [] Anemic  [] Hepatitis Gastrointestinal:  [] Blood in stool   [] Vomiting blood  [] Gastroesophageal reflux/heartburn   [] Difficulty swallowing. Genitourinary:  [] Chronic kidney disease   [] Difficult urination  [] Frequent urination  [] Burning with urination   [] Blood in urine Skin:  [] Rashes   [] Ulcers   [] Wounds Psychological:  [] History of anxiety   []  History of major depression.  Physical Examination  There were no vitals filed for this visit. There is no height or weight on file to calculate BMI. Gen:  WD/WN, NAD Head: [place]/AT, No temporalis wasting. Ear/Nose/Throat: Hearing grossly intact, nares w/o erythema or drainage, trachea midline Eyes: Conjunctiva clear. Sclera non-icteric Neck: Supple.  No bruit  Pulmonary:  Good air movement, equal and clear to auscultation bilaterally.  Cardiac: RRR, No JVD Vascular:  Vessel Right Left  Radial Palpable Palpable                                    Musculoskeletal: M/S 5/5 throughout.  No deformity or atrophy. No edema. Neurologic: CN 2-12 intact. Sensation grossly intact in extremities.  Symmetrical.  Speech is fluent. Motor exam as listed above. Psychiatric: Judgment intact, Mood & affect appropriate for pt's clinical situation. Dermatologic: No rashes or ulcers noted.  No  cellulitis or open wounds.      CBC Lab Results  Component Value Date   WBC 10.5 11/01/2015   HGB 11.4 (L) 11/01/2015   HCT 31.7 (L) 11/01/2015   MCV 89.6 11/01/2015   PLT 287 11/01/2015    BMET    Component Value Date/Time   NA 135 10/31/2015 0521   K 4.1 10/31/2015 0521   CL 106 10/31/2015 0521   CO2 21 (L) 10/31/2015 0521   GLUCOSE 142 (H) 10/31/2015 0521   BUN 23 (H) 03/07/2017 1237   CREATININE 1.40 (H) 03/07/2017 1237   CALCIUM 9.0 10/31/2015 0521   GFRNONAA 37 (L) 03/07/2017 1237   GFRAA 43 (L) 03/07/2017 1237   CrCl cannot be calculated (Patient's most recent lab result is older than the maximum 21 days allowed.).  COAG Lab Results  Component Value Date   INR 0.98 10/29/2015    Radiology US Carotid Duplex Bilateral  Result Date: 07/05/2017 CLINICAL DATA:  72 year old female with a history carotid stenosis. Cardiovascular risk factors include hypertension, diabetes, known vascular surgery EXAM: BILATERAL CAROTID DUPLEX ULTRASOUND TECHNIQUE: Wallace Cullens scale imaging, color Doppler and duplex ultrasound were performed of bilateral carotid and vertebral arteries in the neck. COMPARISON:  None. FINDINGS: Criteria: Quantification of carotid stenosis is based on velocity parameters that correlate the residual internal carotid diameter with NASCET-based stenosis levels, using the diameter of the distal internal carotid lumen as the denominator for stenosis measurement. The following velocity  measurements were obtained: RIGHT ICA:  Systolic 82 cm/sec, Diastolic 13 cm/sec CCA:  89 cm/sec SYSTOLIC ICA/CCA RATIO:  0.9 ECA:  122 cm/sec LEFT ICA:  Systolic 68 cm/sec, Diastolic 16 cm/sec CCA:  86 cm/sec SYSTOLIC ICA/CCA RATIO:  68 ECA:  107 cm/sec Right Brachial SBP: Not acquired Left Brachial SBP: Not acquired RIGHT CAROTID ARTERY: No significant calcifications of the right common carotid artery. Intermediate waveform maintained. Heterogeneous and partially calcified plaque at the right  carotid bifurcation. No significant lumen shadowing. Low resistance waveform of the right ICA. No significant tortuosity. RIGHT VERTEBRAL ARTERY: Antegrade flow with low resistance waveform. LEFT CAROTID ARTERY: No significant calcifications of the left common carotid artery. Intermediate waveform maintained. Heterogeneous and partially calcified plaque at the left carotid bifurcation without significant lumen shadowing. Low resistance waveform of the left ICA. No significant tortuosity. LEFT VERTEBRAL ARTERY:  Antegrade flow with low resistance waveform. IMPRESSION: Color duplex indicates minimal heterogeneous and calcified plaque, with no hemodynamically significant stenosis by duplex criteria in the extracranial cerebrovascular circulation. Signed, Yvone NeuJaime S. Reyne DumasWagner, DO, RPVI [facility] Electronically Signed   By: Gilmer MorJaime  Wagner D.O.   On: 07/05/2017 09:19    Assessment/Plan Essential hypertension blood pressure control important in reducing the progression of atherosclerotic disease. On appropriate oral medications.   PAD (peripheral artery disease) (HCC) Stable. No limb threatening symptoms.  Diabetes mellitus type 2, uncontrolled blood glucose control important in reducing the progression of atherosclerotic disease. Also, involved in wound healing. On appropriate medications.   Carotid atherosclerosis, bilateral Her carotid duplex shows mild carotid plaque bilaterally without hemodynamically significant stenosis on either side (stenosis of less than 40%). This is unlikely the cause of of her retinal disease. Her risk of stroke is minimal and no surgery would be recommended. Continue ASA and statin agent. Recheck in 2-3 years.    Festus BarrenJason Ashlee Bewley, MD  07/11/2017 9:15 AM    This note was created with Dragon medical transcription system.  Any errors from dictation are purely unintentional

## 2017-07-11 NOTE — Assessment & Plan Note (Signed)
blood pressure control important in reducing the progression of atherosclerotic disease. On appropriate oral medications.  

## 2017-07-11 NOTE — Assessment & Plan Note (Signed)
blood glucose control important in reducing the progression of atherosclerotic disease. Also, involved in wound healing. On appropriate medications.  

## 2017-07-11 NOTE — Telephone Encounter (Signed)
The patient had called asking if she could stop her blood thinner.I spoke with Dew and his medical advise is that the patient can stop taking her blood thinner but she needs to continue taking a baby aspirin.I inform the patient with JD medical advise

## 2017-07-11 NOTE — Assessment & Plan Note (Signed)
Stable. No limb threatening symptoms.

## 2017-07-11 NOTE — Assessment & Plan Note (Signed)
Her carotid duplex shows mild carotid plaque bilaterally without hemodynamically significant stenosis on either side (stenosis of less than 40%). This is unlikely the cause of of her retinal disease. Her risk of stroke is minimal and no surgery would be recommended. Continue ASA and statin agent. Recheck in 2-3 years.

## 2017-07-25 NOTE — Progress Notes (Signed)
[facility] - Clinic Note  07/26/2017     CHIEF COMPLAINT Patient presents for Retina Follow Up   HISTORY OF PRESENT ILLNESS: Cheryl Leonard is a 72 y.o. female who presents to the clinic today for:   HPI    Retina Follow Up    Patient presents with  CRVO/BRVO.  In right eye.  Severity is moderate.  Duration of 1 month.  Since onset it is stable.  I, the attending physician,  performed the HPI with the patient and updated documentation appropriately.          Comments    Pt presents for BRVO OD f/u, pt states VA seems to be the same as last visit, states she is not seeing as many floaters, she denies FOL, pain or wavy vision, pt is no longer taking a blood thinner, her BS this morning was 98       Last edited by Rennis ChrisZamora, Larra Crunkleton, MD on 07/27/2017  2:40 PM. (History)     Referring physician: Danella PentonMiller, Mark F, MD [phone] [facility] [facility] [place], [place] [id number]  HISTORICAL INFORMATION:   Selected notes from the MEDICAL RECORD NUMBER Referred by Dr. Senaida Oresichardson for concern of DME OD;  LEE- 02.05.19 [BCVA OD: 20/70-1 OS: 20/30-1] Ocular Hx-  PMH- DM Lab Results  Component Value Date   HGBA1C 5.9 (H) 10/29/2015       CURRENT MEDICATIONS: No current outpatient medications on file. (Ophthalmic Drugs)   Current Facility-Administered Medications (Ophthalmic Drugs)  Medication Route  . aflibercept (EYLEA) SOLN 2 mg Intravitreal  . aflibercept (EYLEA) SOLN 2 mg Intravitreal   Current Outpatient Medications (Other)  Medication Sig  . ALPRAZolam (XANAX) 0.25 MG tablet Take 0.25 mg by mouth at bedtime as needed for sleep.   [name]. aspirin EC 81 MG tablet Take 81 mg by mouth daily.   [name]. atorvastatin (LIPITOR) 20 MG tablet Take 20 mg by mouth at bedtime.  [name]. buPROPion (WELLBUTRIN XL) 150 MG 24 hr tablet Take 150 mg by mouth daily. Take with 300 mg to equal 450 mg once daily  . buPROPion (WELLBUTRIN XL) 300 MG 24 hr tablet Take 300 mg  by mouth daily. Take with 150 mg to equal 450 mg once daily  . BYSTOLIC 5 MG tablet Take 5 mg by mouth daily.  . cholecalciferol (VITAMIN D) 1000 units tablet Take 1,000 Units by mouth 2 (two) times daily.  [name]. CINNAMON PO Take 1 tablet by mouth daily.  . clopidogrel (PLAVIX) 75 MG tablet Take 1 tablet (75 mg total) by mouth daily.  [name]. CORAL CALCIUM PO Take 1 tablet by mouth daily.  [name]. denosumab (PROLIA) 60 MG/ML SOLN injection Inject into the skin.  . DULoxetine (CYMBALTA) 60 MG capsule Take 60 mg by mouth 2 (two) times daily.   [name]. gemfibrozil (LOPID) 600 MG tablet Take 600 mg by mouth 2 (two) times daily.  . Insulin Pen Needle (B-D UF III MINI PEN NEEDLES) 31G X 5 MM MISC once daily.  . Iron-Vitamin C (VITRON-C) 65-125 MG TABS Take 1 tablet by mouth daily.   [name]. JANUMET 50-1000 MG per tablet Take 1 tablet by mouth 2 (two) times daily with a meal.  . levothyroxine (SYNTHROID, LEVOTHROID) 100 MCG tablet Take 100 mcg by mouth daily.  [name]. lisinopril (PRINIVIL,ZESTRIL) 10 MG tablet Take 10 mg by mouth daily.   . Magnesium 500 MG TABS Take 500 mg by mouth daily.  . Multiple Vitamin (MULTIVITAMIN WITH  MINERALS) TABS tablet Take 1 tablet by mouth daily.  . Omega-3 Fatty Acids (FISH OIL) 1000 MG CAPS Take 1,000 mg by mouth daily.  . pantoprazole (PROTONIX) 40 MG tablet Take 40 mg by mouth daily.   . ranitidine (ZANTAC) 300 MG capsule Take 300 mg by mouth at bedtime.  . TRESIBA FLEXTOUCH 200 UNIT/ML SOPN INJECT 30 UNITS SUB-Q NIGHTLY  . vitamin B-12 (CYANOCOBALAMIN) 1000 MCG tablet Take 1,000 mcg by mouth daily.  . vitamin E 400 UNIT capsule Take 400 Units by mouth daily.  [name] zolpidem (AMBIEN) 10 MG tablet Take 10 mg by mouth at bedtime.   Current Facility-Administered Medications (Other)  Medication Route  . Bevacizumab (AVASTIN) SOLN 1.25 mg Intravitreal  . Bevacizumab (AVASTIN) SOLN 1.25 mg Intravitreal  . Bevacizumab (AVASTIN) SOLN 1.25 mg Intravitreal      REVIEW OF SYSTEMS: ROS    Positive  for: Musculoskeletal, Endocrine, Cardiovascular, Eyes, Psychiatric   Negative for: Constitutional, Gastrointestinal, Neurological, Skin, Genitourinary, HENT, Respiratory, Allergic/Imm, Heme/Lymph   Last edited by Posey Boyer, COT on 07/26/2017  2:23 PM. (History)       ALLERGIES No Known Allergies  PAST MEDICAL HISTORY Past Medical History:  Diagnosis Date  . Anxiety   . Arthritis   . Cataracts, both eyes   . GERD (gastroesophageal reflux disease)   . Gout   . History of fracture of patella    right knee  . History of positive PPD    Patient always shows positive  . Hyperlipidemia   . Hypertension   . Hypothyroidism   . Lichen sclerosus 12/30/2013   of vulva  . Metatarsal fracture   . Neuropathy   . Peripheral vascular disease (HCC)   . Polyneuropathy    numbness and tingling in feet and toes  . Type 2 diabetes mellitus, uncontrolled (HCC)    Past Surgical History:  Procedure Laterality Date  . APPENDECTOMY    . BREAST REDUCTION SURGERY  2001  . CATARACT EXTRACTION    . CESAREAN SECTION  1976  . COLONOSCOPY  03/05/2013   Nml - due for repeat 03/06/2018  . DILATION AND CURETTAGE OF UTERUS  1989  . ESOPHAGOGASTRODUODENOSCOPY  03/05/2013  . EYE SURGERY    . Eyelid Surgery  2012  . INTRAMEDULLARY (IM) NAIL INTERTROCHANTERIC Left 10/30/2015   Procedure: INTRAMEDULLARY (IM) NAIL INTERTROCHANTRIC ;  Surgeon: Kennedy Bucker, MD;  Location: [facility] ORS;  Service: Orthopedics;  Laterality: Left;  . LAPAROSCOPIC HYSTERECTOMY  2000   total  . LOWER EXTREMITY ANGIOGRAPHY Left 03/08/2017   Procedure: LOWER EXTREMITY ANGIOGRAPHY;  Surgeon: Annice Needy, MD;  Location: [facility] INVASIVE CV LAB;  Service: Cardiovascular;  Laterality: Left;  . REDUCTION MAMMAPLASTY  1997    FAMILY HISTORY Family History  Problem Relation Age of Onset  . Coronary artery disease Father   . Heart attack Father   . Coronary artery disease Mother   . Heart attack Mother   . Ovarian cancer Sister 55        sister had hormonal therapy for IVF txs-which increased risk factor for ovarian cancer  . Breast cancer Neg Hx     SOCIAL HISTORY Social History   Tobacco Use  . Smoking status: Former Smoker    Packs/day: 1.00    Years: 20.00    Pack years: 20.00    Types: Cigarettes    Last attempt to quit: 03/07/1996    Years since quitting: 21.4  . Smokeless tobacco: Never Used  . Tobacco comment:  started smoking at age 71  Substance Use Topics  . Alcohol use: No    Alcohol/week: 0.0 oz  . Drug use: No         OPHTHALMIC EXAM:  Base Eye Exam    Visual Acuity (Snellen - Linear)      Right Left   Dist [place] 20/40 -1 20/25 -1   Dist ph [place] NI NI       Tonometry (Tonopen, 2:27 PM)      Right Left   Pressure 14 16       Pupils      Dark Light Shape React APD   Right 4 2 Round Brisk None   Left 4 2 Round Brisk None       Visual Fields (Counting fingers)      Left Right    Full Full       Extraocular Movement      Right Left    Full, Ortho Full, Ortho       Neuro/Psych    Oriented x3:  Yes   Mood/Affect:  Normal       Dilation    Both eyes:  1.0% Mydriacyl, 2.5% Phenylephrine @ 2:27 PM        Slit Lamp and Fundus Exam    External Exam      Right Left   External Normal Normal       Slit Lamp Exam      Right Left   Lids/Lashes dermatochalasis dermatochalasis   Conjunctiva/Sclera White and quiet White and quiet   Cornea arcus; well healed cataract wound; 1-2+ Punctate epithelial erosions, irregular epi surface, decreased TBUT arcus; well healed cataract wound, 1+ Punctate epithelial erosions, irregualr epi surface, decreased TBUT   Anterior Chamber Deep and quiet Deep and quiet   Iris Round and dilated Round and dilated   Lens PCIOL; open PC PCIOL; open PC   Vitreous syneresis, Posterior vitreous detachment, floaters inferiorly, trace-1+ cell syneresis, Posterior vitreous detachment       Fundus Exam      Right Left   Disc 1-2+ sectoral disc edema superior  hemisphere with flame hemorrhages, CWS over superior disc Normal   C/D Ratio 0.4 0.5   Macula Blunted foveal reflex, Cystic changes -- improved, scattered Microaneurysms, Epiretinal membrane, single blot heme temporal to fovea flat; no heme or edema   Vessels mildly tortuous; dilated superior arcade venules; mild CWS along sup temp arcade; AV crossing changes mild tortuosity; AV crossing changes   Periphery attached; 360 MAs, scattered DBH superiorly and temporally attached          IMAGING AND PROCEDURES  Imaging and Procedures for 04/25/17  OCT, Retina - OU - Both Eyes       Right Eye Quality was good. Central Foveal Thickness: 338. Progression has improved. Findings include abnormal foveal contour, epiretinal membrane, no SRF, no IRF (Nasal ERM with thickening).   Left Eye Quality was good. Central Foveal Thickness: 289. Progression has been stable. Findings include normal foveal contour, no IRF, no SRF.   Notes *Images captured and stored on drive  Diagnosis / Impression:  OD: NFP: no IRF/SRF; interval resolution of CME; +nasal ERM with thickening OS: NFP; no IRF/SRF  Clinical management:  See below  Abbreviations: NFP - Normal foveal profile. CME - cystoid macular edema. PED - pigment epithelial detachment. IRF - intraretinal fluid. SRF - subretinal fluid. EZ - ellipsoid zone. ERM - epiretinal membrane. ORA - outer retinal atrophy. ORT - outer retinal  tubulation. SRHM - subretinal hyper-reflective material        Intravitreal Injection, Pharmacologic Agent - OD - Right Eye       Time Out 07/26/2017. 3:39 PM. Confirmed correct patient, procedure, site, and patient consented.   Anesthesia Topical anesthesia was used. Anesthetic medications included Lidocaine 2%, Tetracaine 0.5%.   Procedure Preparation included 5% betadine to ocular surface, eyelid speculum. A 30 gauge needle was used.   Injection: 2 mg aflibercept 2 MG/0.05ML   NDC: 16109-604-54    Lot:  [id number]    Expiration Date: 05/01/2018   Route: Intravitreal   Site: Right Eye   Waste: 0.05 mL  Post-op Post injection exam found visual acuity of at least counting fingers. The patient tolerated the procedure well. There were no complications. The patient received written and verbal post procedure care education.        Fluorescein Angiography Optos (Transit OD)       Right Eye Progression has improved. Early phase findings include normal observations (Mild hyperfluorescence of disc, resolution of petaloid hyperfluorescence ). Mid/Late phase findings include normal observations.   Left Eye Progression has been stable. Early phase findings include normal observations. Mid/Late phase findings include normal observations.   Notes *Images stored on disc  Impression:  OD: petaloid hyperfluorescence in central macula -- resolved; interval improvement of hyperfluorescence of disc OS: normal study                 ASSESSMENT/PLAN:    ICD-10-CM   1. Branch retinal vein occlusion of right eye with macular edema H34.8310 Intravitreal Injection, Pharmacologic Agent - OD - Right Eye    Fluorescein Angiography Optos (Transit OD)    aflibercept (EYLEA) SOLN 2 mg  2. Retinal edema H35.81 OCT, Retina - OU - Both Eyes  3. 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 Essential hypertension I10   5. Hypertensive retinopathy of both eyes H35.033 Fluorescein Angiography Optos (Transit OD)  6. Epiretinal membrane (ERM) of right eye H35.371   7. Pseudophakia of both eyes Z96.1   8. 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 Floaters in visual field, right H43.391     1,2. CME OD -- likely remote BRVO OD -- resolved today - by history, pt states symptoms first noticed 2 wks prior to presentation, but reports changes may have occurred prior -  initial exam with differential tortuosity of vessels (OD > OS) - FA on 2.8.19 (initial visit) with petaloid leakage and hyperfluorescence of the disc - differential includes DM2 (DME), hypertensive retinopathy, inflammatory etiology / uveitis - S/P IVA OD #1 (02.08.19), #2 (03.11.19), #3 (04.09.19), #4 (05.20.19) -- no significant improvement - review of OCTs show interval increase in CME OD after initial improvement post IVA #1 -- possible resistance to IVA  - June 2019 -- switched therapies: S/P IVE OD #1 (06.24.19) -- sample - OCT today shows interval resolution of CME OD -- excellent response to IVE OD - BCVA improved to 20/40-1 OD today - repeat FA shows interval improvement in CME/ leakage OD - recommend IVE OD #2 with extension to 6 wks - RBA of procedure discussed, questions answered - informed consent obtained and signed - see procedure note - benefits investigation paperwork for Eylea signed on 06.24.19 - approved for Eylea through [employer] as of 06.26.19 - F/U 6 weeks  3. Mild nonproliferative diabetic retinopathy,  both eyes - The incidence, risk factors for progression, natural history and treatment options for diabetic retinopathy were discussed with patient.   - The need for close monitoring of blood glucose, blood pressure, and serum lipids, avoiding cigarette or any type of tobacco, and the need for long term follow up was also discussed with patient. - could be contributing to CME OD - however OS with minimal diabetic retinopathy - continue to monitor  4,5. Hypertensive retinopathy OU - stable - as above, may be contributing to CME OD - discussed importance of tight BP control - monitor  6. Epiretinal membrane, right eye  The natural history, anatomy, potential for loss of vision, and treatment options including vitrectomy techniques and the complications of endophthalmitis, retinal detachment, vitreous hemorrhage, cataract progression and permanent vision loss  discussed with the patient. - stable - no indication for surgery at this time  7. Pseudophakia OU - s/p CE/IOL OU by cataract surgeon in [facility] - doing well - monitor  8. Optic disc edema OD -- sectoral - likely secondary to BRVO but differential includes carotid stenosis and retro-orbital mass - history of blood clots - recommend CT orbits w/ contrast to r/o retro-orbital mass -- not obtained - recommend carotid dopplers to r/o stenosis / occlusion -- normal study   Ophthalmic Meds Ordered this visit:  Meds ordered this encounter  Medications  . aflibercept (EYLEA) SOLN 2 mg       Return in about 6 weeks (around 09/06/2017) for F/U CME OD, DFE, OCT.  There are no Patient Instructions on file for this visit.   Explained the diagnoses, plan, and follow up with the patient and they expressed understanding.  Patient expressed understanding of the importance of proper follow up care.   This document serves as a record of services personally performed by Karie Chimera, MD, PhD. It was created on their behalf by Laurian Brim, OA, an ophthalmic assistant. The creation of this record is the provider's dictation and/or activities during the visit.    Electronically signed by: Laurian Brim, OA  07.23.2019 2:40 PM    Karie Chimera, M.D., Ph.D. Diseases & Surgery of the Retina and Vitreous [facility] 07/26/17  I have reviewed the above documentation for accuracy and completeness, and I agree with the above. Karie Chimera, M.D., Ph.D. 07/27/17 2:46 PM    Abbreviations: M myopia (nearsighted); A astigmatism; H hyperopia (farsighted); P presbyopia; Mrx spectacle prescription;  CTL contact lenses; OD right eye; OS left eye; OU both eyes  XT exotropia; ET esotropia; PEK punctate epithelial keratitis; PEE punctate epithelial erosions; DES dry eye syndrome; MGD meibomian gland dysfunction; ATs artificial tears; PFAT's preservative free artificial tears; NSC nuclear  sclerotic cataract; PSC posterior subcapsular cataract; ERM epi-retinal membrane; PVD posterior vitreous detachment; RD retinal detachment; DM diabetes mellitus; DR diabetic retinopathy; NPDR non-proliferative diabetic retinopathy; PDR proliferative diabetic retinopathy; CSME clinically significant macular edema; DME diabetic macular edema; dbh dot blot hemorrhages; CWS cotton wool spot; POAG primary open angle glaucoma; C/D cup-to-disc ratio; HVF humphrey visual field; GVF goldmann visual field; OCT optical coherence tomography; IOP intraocular pressure; BRVO Branch retinal vein occlusion; CRVO central retinal vein occlusion; CRAO central retinal artery occlusion; BRAO branch retinal artery occlusion; RT retinal tear; SB scleral buckle; PPV pars plana vitrectomy; VH Vitreous hemorrhage; PRP panretinal laser photocoagulation; IVK intravitreal kenalog; VMT vitreomacular traction; MH Macular hole;  NVD neovascularization of the disc; NVE neovascularization elsewhere; AREDS age related eye disease study; ARMD  age related macular degeneration; POAG primary open angle glaucoma; EBMD epithelial/anterior basement membrane dystrophy; ACIOL anterior chamber intraocular lens; IOL intraocular lens; PCIOL posterior chamber intraocular lens; Phaco/IOL phacoemulsification with intraocular lens placement; [place] photorefractive keratectomy; LASIK laser assisted in situ keratomileusis; HTN hypertension; DM diabetes mellitus; COPD chronic obstructive pulmonary disease

## 2017-07-26 ENCOUNTER — Encounter (INDEPENDENT_AMBULATORY_CARE_PROVIDER_SITE_OTHER): Payer: Self-pay | Admitting: Ophthalmology

## 2017-07-26 ENCOUNTER — Ambulatory Visit (INDEPENDENT_AMBULATORY_CARE_PROVIDER_SITE_OTHER): Payer: Medicare Other | Admitting: Ophthalmology

## 2017-07-26 DIAGNOSIS — H35033 Hypertensive retinopathy, bilateral: Secondary | ICD-10-CM

## 2017-07-26 DIAGNOSIS — H34831 Tributary (branch) retinal vein occlusion, right eye, with macular edema: Secondary | ICD-10-CM

## 2017-07-26 DIAGNOSIS — H43391 Other vitreous opacities, right eye: Secondary | ICD-10-CM

## 2017-07-26 DIAGNOSIS — Z961 Presence of intraocular lens: Secondary | ICD-10-CM

## 2017-07-26 DIAGNOSIS — I10 Essential (primary) hypertension: Secondary | ICD-10-CM | POA: Diagnosis not present

## 2017-07-26 DIAGNOSIS — H3581 Retinal edema: Secondary | ICD-10-CM

## 2017-07-26 DIAGNOSIS — E113213 Type 2 diabetes mellitus with mild nonproliferative diabetic retinopathy with macular edema, bilateral: Secondary | ICD-10-CM | POA: Diagnosis not present

## 2017-07-26 DIAGNOSIS — H4710 Unspecified papilledema: Secondary | ICD-10-CM

## 2017-07-26 DIAGNOSIS — H35371 Puckering of macula, right eye: Secondary | ICD-10-CM

## 2017-07-26 MED ORDER — AFLIBERCEPT 2MG/0.05ML IZ SOLN FOR KALEIDOSCOPE
2.0000 mg | INTRAVITREAL | Status: DC
  Administered 2017-07-26: 2 mg via INTRAVITREAL

## 2017-07-27 ENCOUNTER — Encounter (INDEPENDENT_AMBULATORY_CARE_PROVIDER_SITE_OTHER): Payer: Self-pay | Admitting: Ophthalmology

## 2017-08-08 ENCOUNTER — Ambulatory Visit (INDEPENDENT_AMBULATORY_CARE_PROVIDER_SITE_OTHER): Payer: Medicare Other | Admitting: Vascular Surgery

## 2017-08-22 ENCOUNTER — Ambulatory Visit (INDEPENDENT_AMBULATORY_CARE_PROVIDER_SITE_OTHER): Payer: Medicare Other | Admitting: Vascular Surgery

## 2017-09-05 ENCOUNTER — Ambulatory Visit (INDEPENDENT_AMBULATORY_CARE_PROVIDER_SITE_OTHER): Payer: Medicare Other | Admitting: Vascular Surgery

## 2017-09-05 NOTE — Progress Notes (Addendum)
[facility] - Clinic Note  09/06/2017     CHIEF COMPLAINT Patient presents for Retina Follow Up   HISTORY OF PRESENT ILLNESS: Cheryl Leonard is a 72 y.o. female who presents to the clinic today for:   HPI    Retina Follow Up    Patient presents with  CRVO/BRVO.  In right eye.  Severity is moderate.  Duration of 6 weeks.  Since onset it is stable.  I, the attending physician,  performed the HPI with the patient and updated documentation appropriately.          Comments    Pt presents for BRVO OD f/u, pt states VA has been the same since last visit, pt denies FOL, floaters, pain or wavy vision, pt is using AT's for dryness       Last edited by [name], MD on 09/06/2017  5:00 PM. (History)     Referring physician: Danella Penton, MD [phone] [facility] [address] [facility] [place], [place] [address]  HISTORICAL INFORMATION:   Selected notes from the MEDICAL RECORD NUMBER Referred by Dr. Senaida Ores for concern of DME OD;  LEE- 02.05.19 [BCVA OD: 20/70-1 OS: 20/30-1] Ocular Hx-  PMH- DM Lab Results  Component Value Date   HGBA1C 5.9 (H) 10/29/2015       CURRENT MEDICATIONS: No current outpatient medications on file. (Ophthalmic Drugs)   Current Facility-Administered Medications (Ophthalmic Drugs)  Medication Route  . aflibercept (EYLEA) SOLN 2 mg Intravitreal  . aflibercept (EYLEA) SOLN 2 mg Intravitreal  . aflibercept (EYLEA) SOLN 2 mg Intravitreal   Current Outpatient Medications (Other)  Medication Sig  . buPROPion (WELLBUTRIN XL) 150 MG 24 hr tablet Take by mouth.  . ALPRAZolam (XANAX) 0.25 MG tablet Take 0.25 mg by mouth at bedtime as needed for sleep.   [name] amoxicillin (AMOXIL) 500 MG capsule BEGINNING MORNING OF PROCEDURE TAKE ONE CAPSULE 4 TIMES A DAY UNTIL FINISHED  . aspirin EC 81 MG tablet Take 81 mg by mouth daily.   [name] atorvastatin (LIPITOR) 20 MG tablet Take 20 mg by mouth at bedtime.  [name] buPROPion (WELLBUTRIN XL)  150 MG 24 hr tablet Take 150 mg by mouth daily. Take with 300 mg to equal 450 mg once daily  . buPROPion (WELLBUTRIN XL) 300 MG 24 hr tablet Take 300 mg by mouth daily. Take with 150 mg to equal 450 mg once daily  . BYSTOLIC 5 MG tablet Take 5 mg by mouth daily.  . chlorhexidine (PERIDEX) 0.12 % solution RINSE FOR 30 SECONDS WITH 0.5 OZ TWICE A DAY  . cholecalciferol (VITAMIN D) 1000 units tablet Take 1,000 Units by mouth 2 (two) times daily.  [name] CINNAMON PO Take 1 tablet by mouth daily.  . clopidogrel (PLAVIX) 75 MG tablet Take 1 tablet (75 mg total) by mouth daily.  [name] CORAL CALCIUM PO Take 1 tablet by mouth daily.  [name] denosumab (PROLIA) 60 MG/ML SOLN injection Inject into the skin.  . DULoxetine (CYMBALTA) 60 MG capsule Take 60 mg by mouth 2 (two) times daily.   [name] gemfibrozil (LOPID) 600 MG tablet Take 600 mg by mouth 2 (two) times daily.  . Insulin Pen Needle (B-D UF III MINI PEN NEEDLES) 31G X 5 MM MISC once daily.  . Iron-Vitamin C (VITRON-C) 65-125 MG TABS Take 1 tablet by mouth daily.   [name] JANUMET 50-1000 MG per tablet Take 1 tablet by mouth 2 (two) times daily with a meal.  . levothyroxine (SYNTHROID,  LEVOTHROID) 100 MCG tablet Take 100 mcg by mouth daily.  [name] lisinopril (PRINIVIL,ZESTRIL) 10 MG tablet Take 10 mg by mouth daily.   . Magnesium 500 MG TABS Take 500 mg by mouth daily.  . Multiple Vitamin (MULTIVITAMIN WITH MINERALS) TABS tablet Take 1 tablet by mouth daily.  . Omega-3 Fatty Acids (FISH OIL) 1000 MG CAPS Take 1,000 mg by mouth daily.  . pantoprazole (PROTONIX) 40 MG tablet Take 40 mg by mouth daily.   . ranitidine (ZANTAC) 300 MG capsule Take 300 mg by mouth at bedtime.  . TRESIBA FLEXTOUCH 200 UNIT/ML SOPN INJECT 30 UNITS SUB-Q NIGHTLY  . vitamin B-12 (CYANOCOBALAMIN) 1000 MCG tablet Take 1,000 mcg by mouth daily.  . vitamin E 400 UNIT capsule Take 400 Units by mouth daily.  [name] zolpidem (AMBIEN) 10 MG tablet Take 10 mg by mouth at bedtime.   Current Facility-Administered  Medications (Other)  Medication Route  . Bevacizumab (AVASTIN) SOLN 1.25 mg Intravitreal  . Bevacizumab (AVASTIN) SOLN 1.25 mg Intravitreal  . Bevacizumab (AVASTIN) SOLN 1.25 mg Intravitreal      REVIEW OF SYSTEMS: ROS    Positive for: Musculoskeletal, Endocrine, Cardiovascular, Eyes, Psychiatric   Negative for: Constitutional, Gastrointestinal, Neurological, Skin, Genitourinary, HENT, Respiratory, Allergic/Imm, Heme/Lymph   Last edited by Posey Boyer, COT on 09/06/2017  2:41 PM. (History)       ALLERGIES No Known Allergies  PAST MEDICAL HISTORY Past Medical History:  Diagnosis Date  . Anxiety   . Arthritis   . Cataracts, both eyes   . GERD (gastroesophageal reflux disease)   . Gout   . History of fracture of patella    right knee  . History of positive PPD    Patient always shows positive  . Hyperlipidemia   . Hypertension   . Hypothyroidism   . Lichen sclerosus 12/30/2013   of vulva  . Metatarsal fracture   . Neuropathy   . Peripheral vascular disease (HCC)   . Polyneuropathy    numbness and tingling in feet and toes  . Type 2 diabetes mellitus, uncontrolled (HCC)    Past Surgical History:  Procedure Laterality Date  . APPENDECTOMY    . BREAST REDUCTION SURGERY  2001  . CATARACT EXTRACTION    . CESAREAN SECTION  1976  . COLONOSCOPY  03/05/2013   Nml - due for repeat 03/06/2018  . DILATION AND CURETTAGE OF UTERUS  1989  . ESOPHAGOGASTRODUODENOSCOPY  03/05/2013  . EYE SURGERY    . Eyelid Surgery  2012  . INTRAMEDULLARY (IM) NAIL INTERTROCHANTERIC Left 10/30/2015   Procedure: INTRAMEDULLARY (IM) NAIL INTERTROCHANTRIC ;  Surgeon: Kennedy Bucker, MD;  Location: [facility] ORS;  Service: Orthopedics;  Laterality: Left;  . LAPAROSCOPIC HYSTERECTOMY  2000   total  . LOWER EXTREMITY ANGIOGRAPHY Left 03/08/2017   Procedure: LOWER EXTREMITY ANGIOGRAPHY;  Surgeon: Annice Needy, MD;  Location: [facility] INVASIVE CV LAB;  Service: Cardiovascular;  Laterality: Left;  . REDUCTION  MAMMAPLASTY  1997    FAMILY HISTORY Family History  Problem Relation Age of Onset  . Coronary artery disease Father   . Heart attack Father   . Coronary artery disease Mother   . Heart attack Mother   . Ovarian cancer Sister 70       sister had hormonal therapy for IVF txs-which increased risk factor for ovarian cancer  . Breast cancer Neg Hx     SOCIAL HISTORY Social History   Tobacco Use  . Smoking status: Former Smoker    Packs/day:  1.00    Years: 20.00    Pack years: 20.00    Types: Cigarettes    Last attempt to quit: 03/07/1996    Years since quitting: 21.5  . Smokeless tobacco: Never Used  . Tobacco comment: started smoking at age 38  Substance Use Topics  . Alcohol use: No    Alcohol/week: 0.0 standard drinks  . Drug use: No         OPHTHALMIC EXAM:  Base Eye Exam    Visual Acuity (Snellen - Linear)      Right Left   Dist cc 20/40 20/25 +1   Dist ph cc NI 20/20 -1   Correction:  Glasses       Tonometry (Tonopen, 2:45 PM)      Right Left   Pressure 17 13       Pupils      Dark Light Shape React APD   Right 3 2 Round Brisk None   Left 3 2 Round Brisk None       Visual Fields (Counting fingers)      Left Right    Full Full       Neuro/Psych    Oriented x3:  Yes   Mood/Affect:  Normal       Dilation    Both eyes:  1.0% Mydriacyl, 2.5% Phenylephrine @ 2:45 PM        Slit Lamp and Fundus Exam    External Exam      Right Left   External Normal Normal       Slit Lamp Exam      Right Left   Lids/Lashes dermatochalasis dermatochalasis   Conjunctiva/Sclera White and quiet White and quiet   Cornea arcus; well healed cataract wound; 2-3+ Punctate epithelial erosions, decreased TBUT, mild Anterior basement membrane dystrophy superiorly arcus; well healed cataract wound, 2+ Punctate epithelial erosions, irregualr epi surface, decreased TBUT   Anterior Chamber Deep and quiet Deep and quiet   Iris Round and dilated Round and dilated   Lens  PCIOL; open PC PCIOL; open PC   Vitreous syneresis, Posterior vitreous detachment, floaters inferiorly, trace-1+ cell syneresis, Posterior vitreous detachment       Fundus Exam      Right Left   Disc +CWS with surrounding mild IRH superior disc Normal   C/D Ratio 0.4 0.5   Macula Blunted foveal reflex, Cystic changes -- improved, scattered Microaneurysms, Epiretinal membrane flat; no heme or edema   Vessels mildly tortuous; dilated superior arcade venules; mild CWS along sup temp arcade; AV crossing changes mild tortuosity; AV crossing changes   Periphery attached; 360 MAs, scattered DBH superiorly and temporally attached          IMAGING AND PROCEDURES  Imaging and Procedures for 04/25/17  OCT, Retina - OU - Both Eyes       Right Eye Quality was good. Central Foveal Thickness: 331. Progression has been stable. Findings include abnormal foveal contour, epiretinal membrane, no SRF, no IRF (Nasal ERM with thickening).   Left Eye Quality was good. Central Foveal Thickness: 282. Progression has been stable. Findings include normal foveal contour, no IRF, no SRF.   Notes *Images captured and stored on drive  Diagnosis / Impression:  OD: no IRF/SRF; CME remains resolved OS: NFP; no IRF/SRF  Clinical management:  See below  Abbreviations: NFP - Normal foveal profile. CME - cystoid macular edema. PED - pigment epithelial detachment. IRF - intraretinal fluid. SRF - subretinal fluid. EZ - ellipsoid zone. ERM -  epiretinal membrane. ORA - outer retinal atrophy. ORT - outer retinal tubulation. SRHM - subretinal hyper-reflective material        Intravitreal Injection, Pharmacologic Agent - OD - Right Eye       Time Out 09/06/2017. 3:42 PM. Confirmed correct patient, procedure, site, and patient consented.   Anesthesia Topical anesthesia was used. Anesthetic medications included Tetracaine 0.5%, Lidocaine 2%.   Procedure Preparation included eyelid speculum, 5% betadine to  ocular surface. A 30 gauge needle was used.   Injection:  2 mg aflibercept 2 MG/0.05ML   NDC: 61755-005-02, Lot: [id number], Expiration date: 08/01/2017   Route: Intravitreal, Site: Right Eye, Waste: 0.05 mL  Post-op Post injection exam found visual acuity of at least counting fingers. The patient tolerated the procedure well. There were no complications. The patient received written and verbal post procedure care education.                 ASSESSMENT/PLAN:    ICD-10-CM   1. Branch retinal vein occlusion of right eye with macular edema H34.8310 OCT, Retina - OU - Both Eyes    Intravitreal Injection, Pharmacologic Agent - OD - Right Eye    aflibercept (EYLEA) SOLN 2 mg  2. Retinal edema H35.81 OCT, Retina - OU - Both Eyes  3. 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 Essential hypertension I10   5. Hypertensive retinopathy of both eyes H35.033   6. Epiretinal membrane (ERM) of right eye H35.371   7. Pseudophakia of both eyes Z96.1   8. 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 CME OD -- likely remote BRVO OD -- remains resolved today - by history, pt states symptoms first noticed 2 wks prior to presentation, but reports changes may have occurred prior - initial exam with differential tortuosity of vessels (OD > OS) - FA on 2.8.19 (initial visit) with petaloid leakage and hyperfluorescence of the disc - differential includes DM2 (DME), hypertensive retinopathy, inflammatory etiology / uveitis - S/P IVA OD #1 (02.08.19), #2 (03.11.19), #3 (04.09.19), #4 (05.20.19) -- no significant improvement - review of OCTs show interval increase in CME OD after initial improvement post IVA #1 -- possible resistance to IVA  - June 2019 -- switched therapies: S/P IVE OD #1 (06.24.19), #2 (07.24.19)  - OCT today shows stable resolution of CME OD -- excellent response to IVE OD - BCVA  stable at  20/40 OD today - repeat FA on 07.24.19 showed interval improvement in CME/ leakage OD - recommend IVE OD #3 with extension to 8 weeks - RBA of procedure discussed, questions answered - informed consent obtained and signed - see procedure note - benefits investigation paperwork for Eylea signed on 06.24.19 - approved for Eylea through [employer] as of 06.26.19 - F/U 8 weeks  3. Mild nonproliferative diabetic retinopathy, both eyes - The incidence, risk factors for progression, natural history and treatment options for diabetic retinopathy were discussed with patient.   - The need for close monitoring of blood glucose, blood pressure, and serum lipids, avoiding cigarette or any type of tobacco, and the need for long term follow up was also discussed with patient. - could have contributed to CME OD - OS with minimal diabetic retinopathy - continue to monitor  4,5. Hypertensive retinopathy OU - stable - as above, may have contributed to CME OD - discussed importance of tight BP control - monitor  6. Epiretinal membrane, right eye  The natural history, anatomy, potential for loss of vision, and treatment options including vitrectomy techniques and the complications of endophthalmitis, retinal detachment, vitreous hemorrhage, cataract progression and permanent vision loss discussed with the patient. - stable - no indication for surgery at this time  7. Pseudophakia OU - s/p CE/IOL OU by cataract surgeon in [facility] - doing well - monitor  8. Optic disc edema OD -- sectoral - likely secondary to BRVO but differential includes carotid stenosis and retro-orbital mass - history of blood clots - recommend CT orbits w/ contrast to r/o retro-orbital mass -- not obtained - recommend carotid dopplers to r/o stenosis / occlusion -- normal study   Ophthalmic Meds Ordered this visit:  Meds ordered this encounter  Medications  . aflibercept (EYLEA) SOLN 2 mg       Return in about 8  weeks (around 11/01/2017) for F/U CME OD, DFE, OCT.  There are no Patient Instructions on file for this visit.   Explained the diagnoses, plan, and follow up with the patient and they expressed understanding.  Patient expressed understanding of the importance of proper follow up care.   This document serves as a record of services personally performed by Karie Chimera, MD, PhD. It was created on their behalf by Laurian Brim, OA, an ophthalmic assistant. The creation of this record is the provider's dictation and/or activities during the visit.    Electronically signed by: Laurian Brim, OA  09.03.2019 5:04 PM    Karie Chimera, M.D., Ph.D. Diseases & Surgery of the Retina and Vitreous [facility]   I have reviewed the above documentation for accuracy and completeness, and I agree with the above. Karie Chimera, M.D., Ph.D. 09/06/17 5:04 PM    Abbreviations: M myopia (nearsighted); A astigmatism; H hyperopia (farsighted); P presbyopia; Mrx spectacle prescription;  CTL contact lenses; OD right eye; OS left eye; OU both eyes  XT exotropia; ET esotropia; PEK punctate epithelial keratitis; PEE punctate epithelial erosions; DES dry eye syndrome; MGD meibomian gland dysfunction; ATs artificial tears; PFAT's preservative free artificial tears; NSC nuclear sclerotic cataract; PSC posterior subcapsular cataract; ERM epi-retinal membrane; PVD posterior vitreous detachment; RD retinal detachment; DM diabetes mellitus; DR diabetic retinopathy; NPDR non-proliferative diabetic retinopathy; PDR proliferative diabetic retinopathy; CSME clinically significant macular edema; DME diabetic macular edema; dbh dot blot hemorrhages; CWS cotton wool spot; POAG primary open angle glaucoma; C/D cup-to-disc ratio; HVF humphrey visual field; GVF goldmann visual field; OCT optical coherence tomography; IOP intraocular pressure; BRVO Branch retinal vein occlusion; CRVO central retinal vein  occlusion; CRAO central retinal artery occlusion; BRAO branch retinal artery occlusion; RT retinal tear; SB scleral buckle; PPV pars plana vitrectomy; VH Vitreous hemorrhage; PRP panretinal laser photocoagulation; IVK intravitreal kenalog; VMT vitreomacular traction; MH Macular hole;  NVD neovascularization of the disc; NVE neovascularization elsewhere; AREDS age related eye disease study; ARMD age related macular degeneration; POAG primary open angle glaucoma; EBMD epithelial/anterior basement membrane dystrophy; ACIOL anterior chamber intraocular lens; IOL intraocular lens; PCIOL posterior chamber intraocular lens; Phaco/IOL phacoemulsification with intraocular lens placement; PRK photorefractive keratectomy; LASIK laser assisted in situ keratomileusis; HTN hypertension; DM diabetes mellitus; COPD chronic obstructive pulmonary disease

## 2017-09-06 ENCOUNTER — Encounter (INDEPENDENT_AMBULATORY_CARE_PROVIDER_SITE_OTHER): Payer: Self-pay | Admitting: Ophthalmology

## 2017-09-06 ENCOUNTER — Ambulatory Visit (INDEPENDENT_AMBULATORY_CARE_PROVIDER_SITE_OTHER): Payer: Medicare Other | Admitting: Ophthalmology

## 2017-09-06 DIAGNOSIS — H34831 Tributary (branch) retinal vein occlusion, right eye, with macular edema: Secondary | ICD-10-CM

## 2017-09-06 DIAGNOSIS — E113213 Type 2 diabetes mellitus with mild nonproliferative diabetic retinopathy with macular edema, bilateral: Secondary | ICD-10-CM

## 2017-09-06 DIAGNOSIS — H35033 Hypertensive retinopathy, bilateral: Secondary | ICD-10-CM

## 2017-09-06 DIAGNOSIS — H4710 Unspecified papilledema: Secondary | ICD-10-CM

## 2017-09-06 DIAGNOSIS — Z961 Presence of intraocular lens: Secondary | ICD-10-CM

## 2017-09-06 DIAGNOSIS — H3581 Retinal edema: Secondary | ICD-10-CM | POA: Diagnosis not present

## 2017-09-06 DIAGNOSIS — H35371 Puckering of macula, right eye: Secondary | ICD-10-CM

## 2017-09-06 DIAGNOSIS — I10 Essential (primary) hypertension: Secondary | ICD-10-CM

## 2017-09-06 MED ORDER — AFLIBERCEPT 2MG/0.05ML IZ SOLN FOR KALEIDOSCOPE
2.0000 mg | INTRAVITREAL | Status: DC
  Administered 2017-09-06: 2 mg via INTRAVITREAL

## 2017-09-14 ENCOUNTER — Ambulatory Visit
Admission: RE | Admit: 2017-09-14 | Discharge: 2017-09-14 | Disposition: A | Discharge status: Home / Self Care | Payer: Medicare Other | Source: Ambulatory Visit | Attending: Emergency Medicine | Admitting: Emergency Medicine

## 2017-09-14 ENCOUNTER — Other Ambulatory Visit: Payer: Self-pay | Admitting: Emergency Medicine

## 2017-09-14 ENCOUNTER — Emergency Department
Admission: EM | Admit: 2017-09-14 | Discharge: 2017-09-14 | Disposition: A | Discharge status: Home / Self Care | Payer: Medicare Other | Attending: Emergency Medicine | Admitting: Emergency Medicine

## 2017-09-14 DIAGNOSIS — Y9301 Activity, walking, marching and hiking: Secondary | ICD-10-CM | POA: Diagnosis not present

## 2017-09-14 DIAGNOSIS — E119 Type 2 diabetes mellitus without complications: Secondary | ICD-10-CM | POA: Insufficient documentation

## 2017-09-14 DIAGNOSIS — Y999 Unspecified external cause status: Secondary | ICD-10-CM | POA: Insufficient documentation

## 2017-09-14 DIAGNOSIS — W19XXXA Unspecified fall, initial encounter: Secondary | ICD-10-CM

## 2017-09-14 DIAGNOSIS — Y929 Unspecified place or not applicable: Secondary | ICD-10-CM | POA: Insufficient documentation

## 2017-09-14 DIAGNOSIS — S0990XA Unspecified injury of head, initial encounter: Secondary | ICD-10-CM

## 2017-09-14 DIAGNOSIS — I10 Essential (primary) hypertension: Secondary | ICD-10-CM | POA: Diagnosis not present

## 2017-09-14 DIAGNOSIS — S79911A Unspecified injury of right hip, initial encounter: Secondary | ICD-10-CM | POA: Diagnosis present

## 2017-09-14 DIAGNOSIS — R51 Headache: Secondary | ICD-10-CM | POA: Insufficient documentation

## 2017-09-14 DIAGNOSIS — Y92129 Unspecified place in nursing home as the place of occurrence of the external cause: Secondary | ICD-10-CM

## 2017-09-14 DIAGNOSIS — S7001XA Contusion of right hip, initial encounter: Secondary | ICD-10-CM | POA: Insufficient documentation

## 2017-09-14 DIAGNOSIS — W010XXA Fall on same level from slipping, tripping and stumbling without subsequent striking against object, initial encounter: Secondary | ICD-10-CM | POA: Insufficient documentation

## 2017-09-14 IMAGING — CR DG RIBS W/ CHEST 3+V*R*
5 series · 5 of 5 positions shown · non-contrast
Comparison: [DATE]

CLINICAL DATA: Post fall with right rib pain.

EXAM:
RIGHT RIBS AND CHEST - 3+ VIEW

[chest pa]
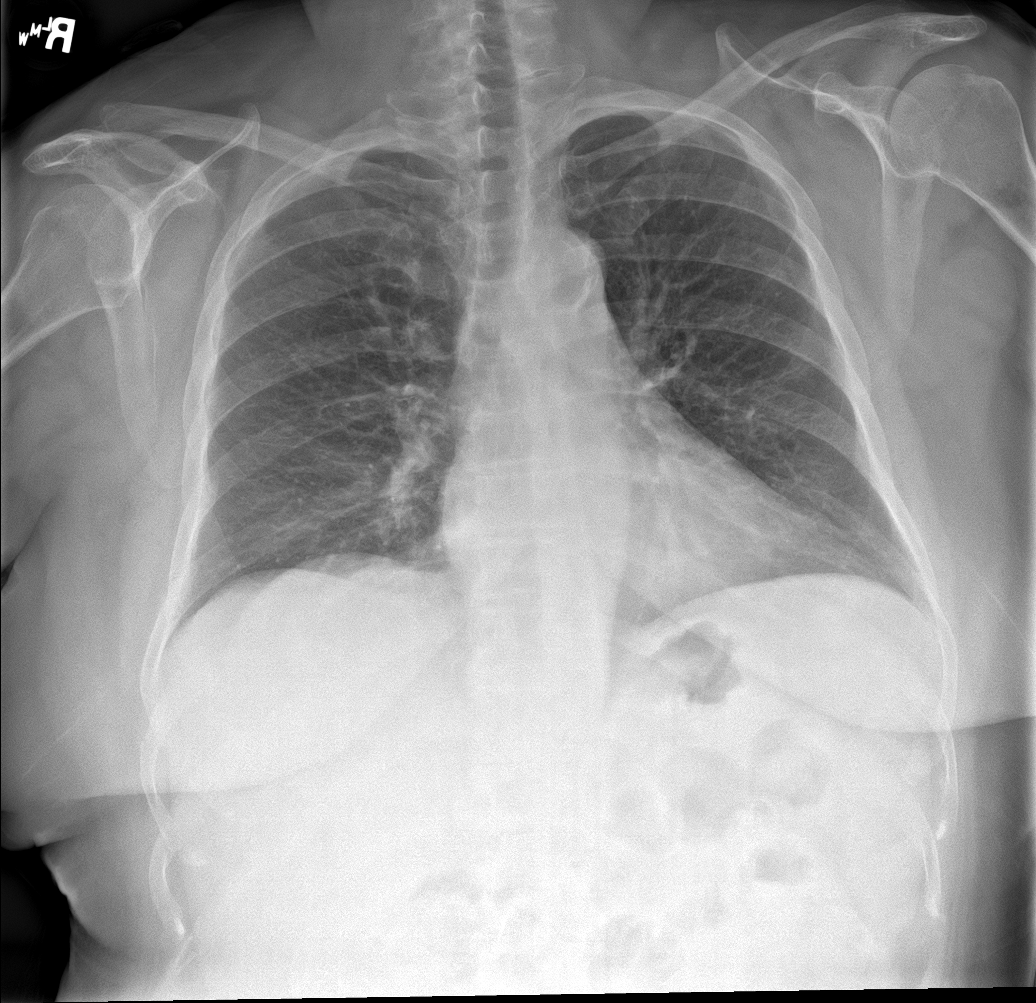

[rib pa]
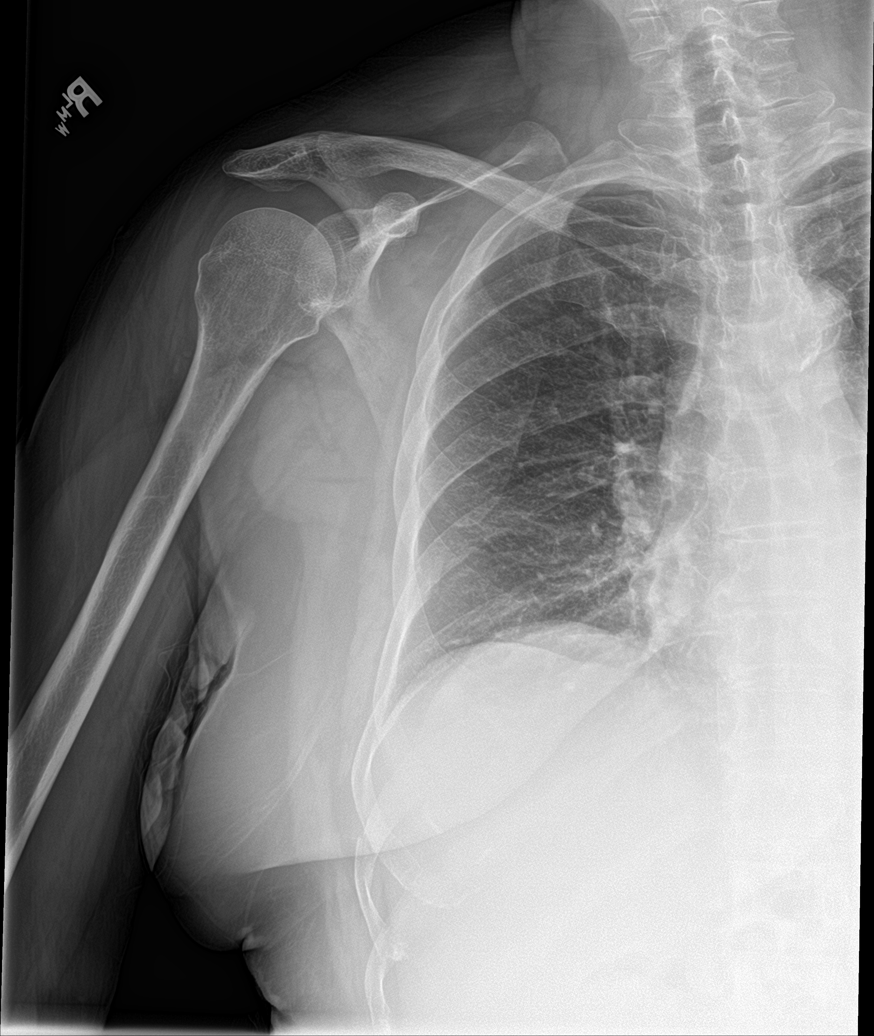

[rib pa obl]
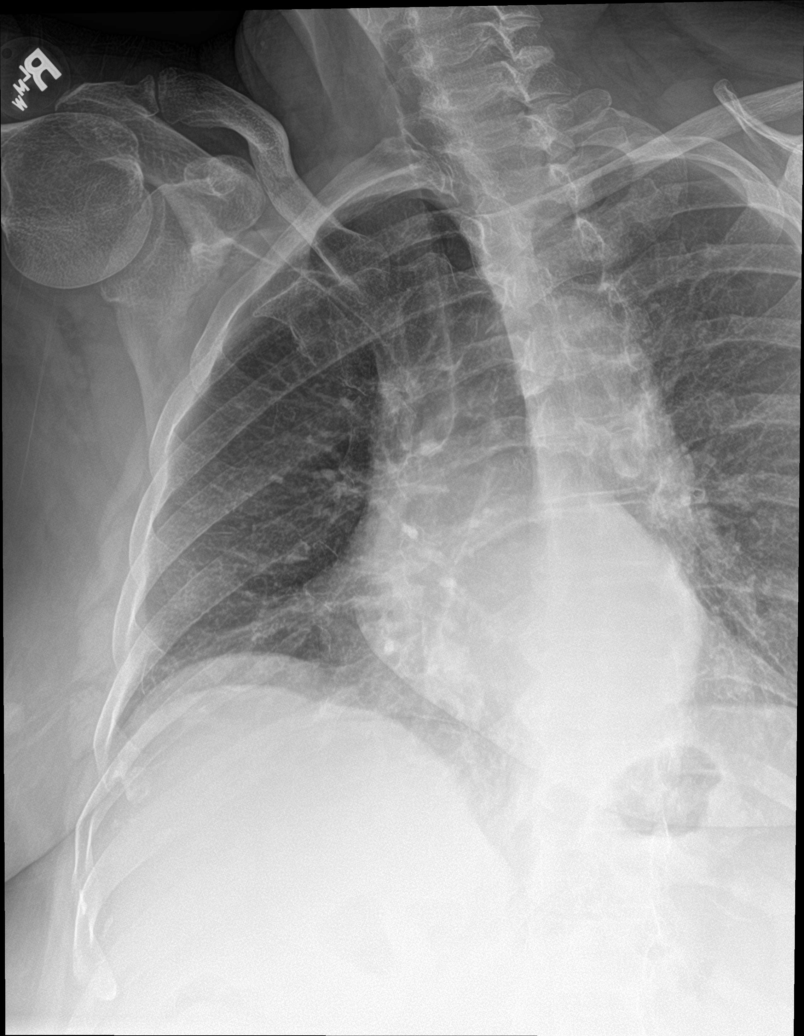

[rib ap]
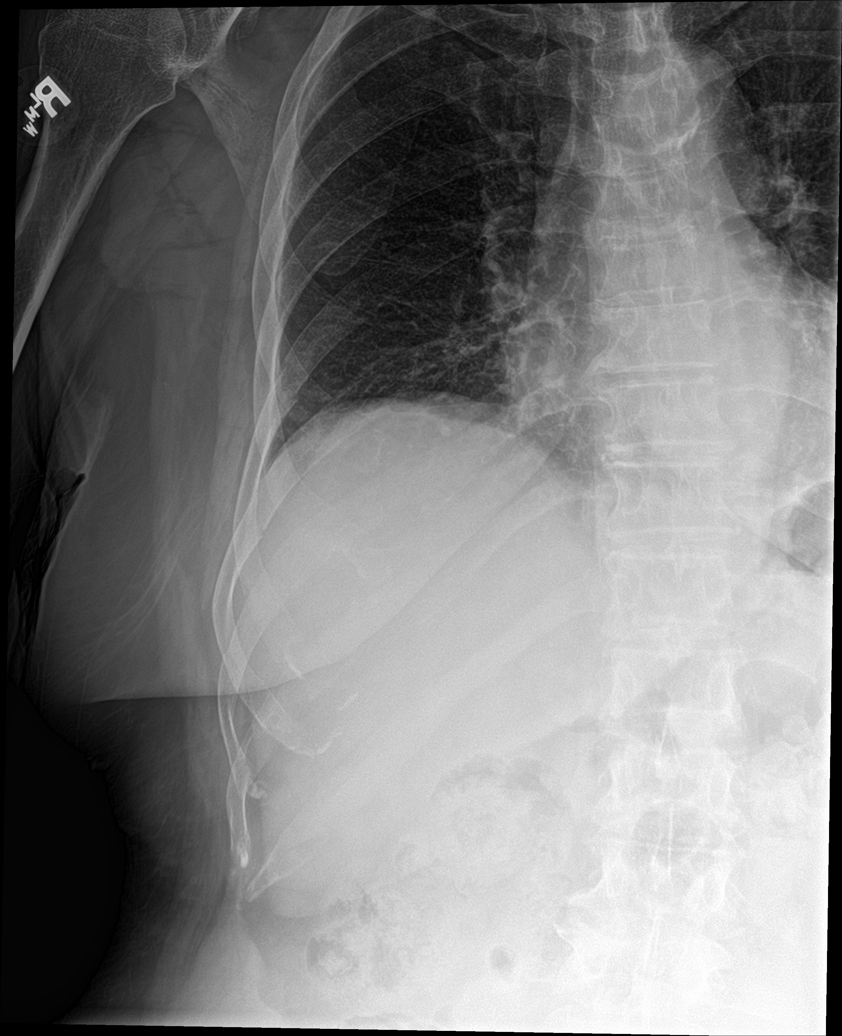

[rib ap obl]
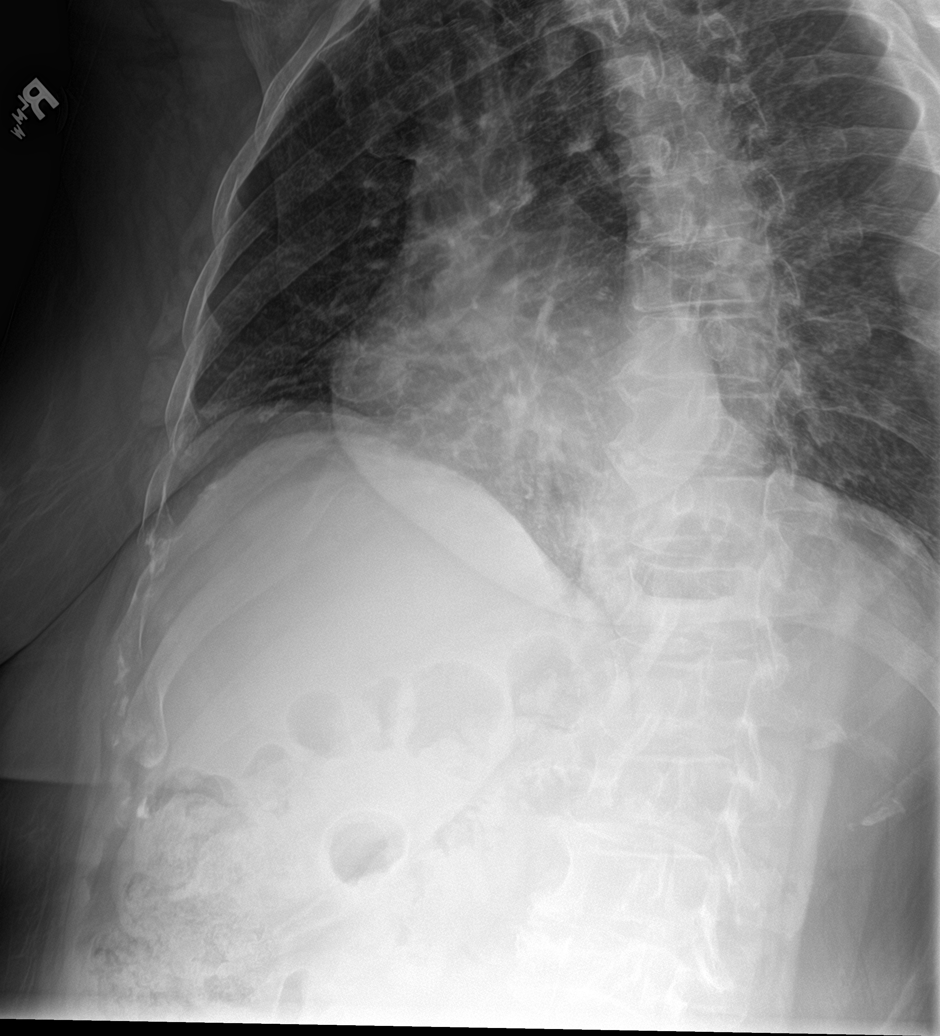

[5 of 5 positions shown; findings below may reference images not displayed]

FINDINGS: Minimally displaced fracture of right lateral ninth rib. No
additional rib fracture. No pulmonary complication such as
pneumothorax or pulmonary contusion. No pleural fluid. Normal heart
size and mediastinal contours.
IMPRESSION: Minimally displaced right lateral ninth rib fracture without
pulmonary complication.

## 2017-09-14 IMAGING — CR DG HIP (WITH OR WITHOUT PELVIS) 2-3V*R*
4 series · 4 of 4 positions shown · non-contrast
Comparison: None.

CLINICAL DATA: Right hip pain after fall.

EXAM:
DG HIP (WITH OR WITHOUT PELVIS) 2-3V RIGHT

[pelvis ap (1 of 2)]
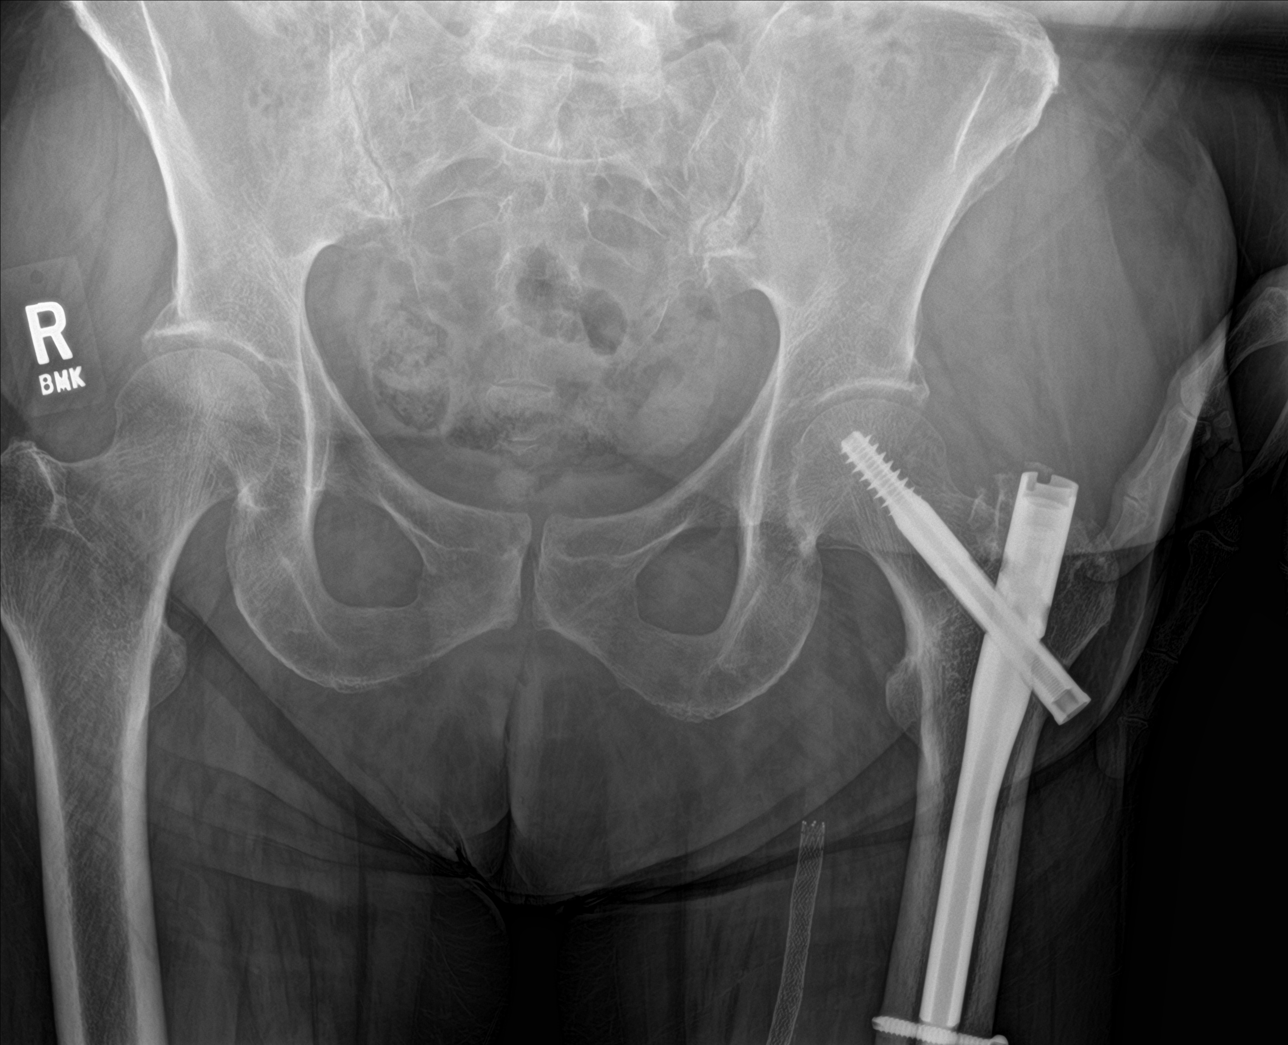

[hip ap]
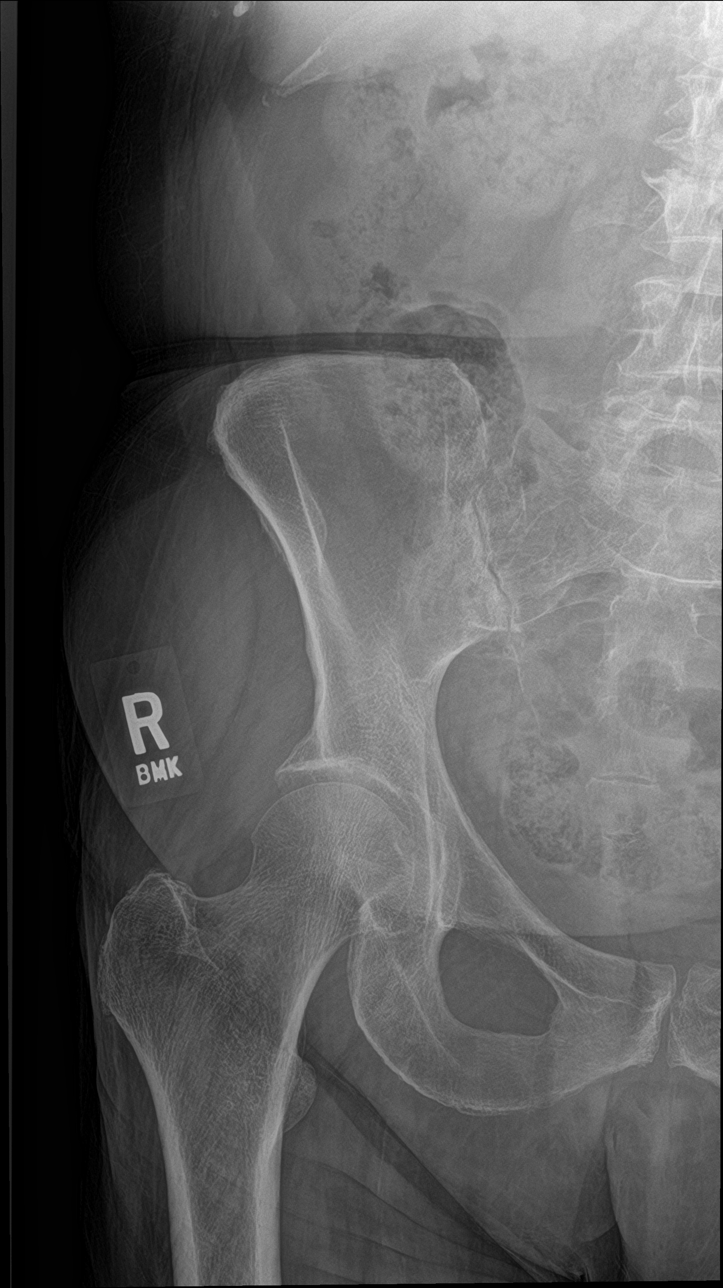

[hip lat]
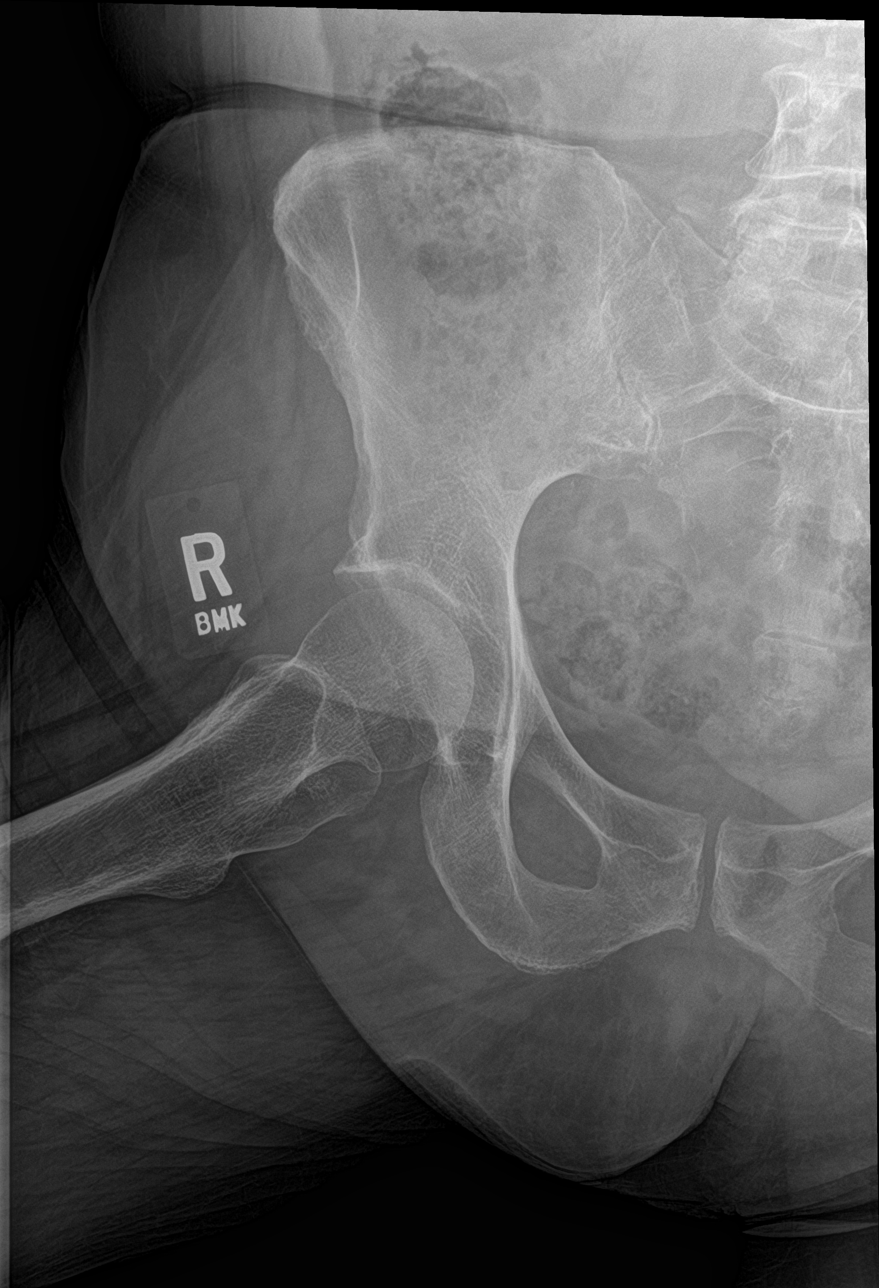

[pelvis ap (2 of 2)]
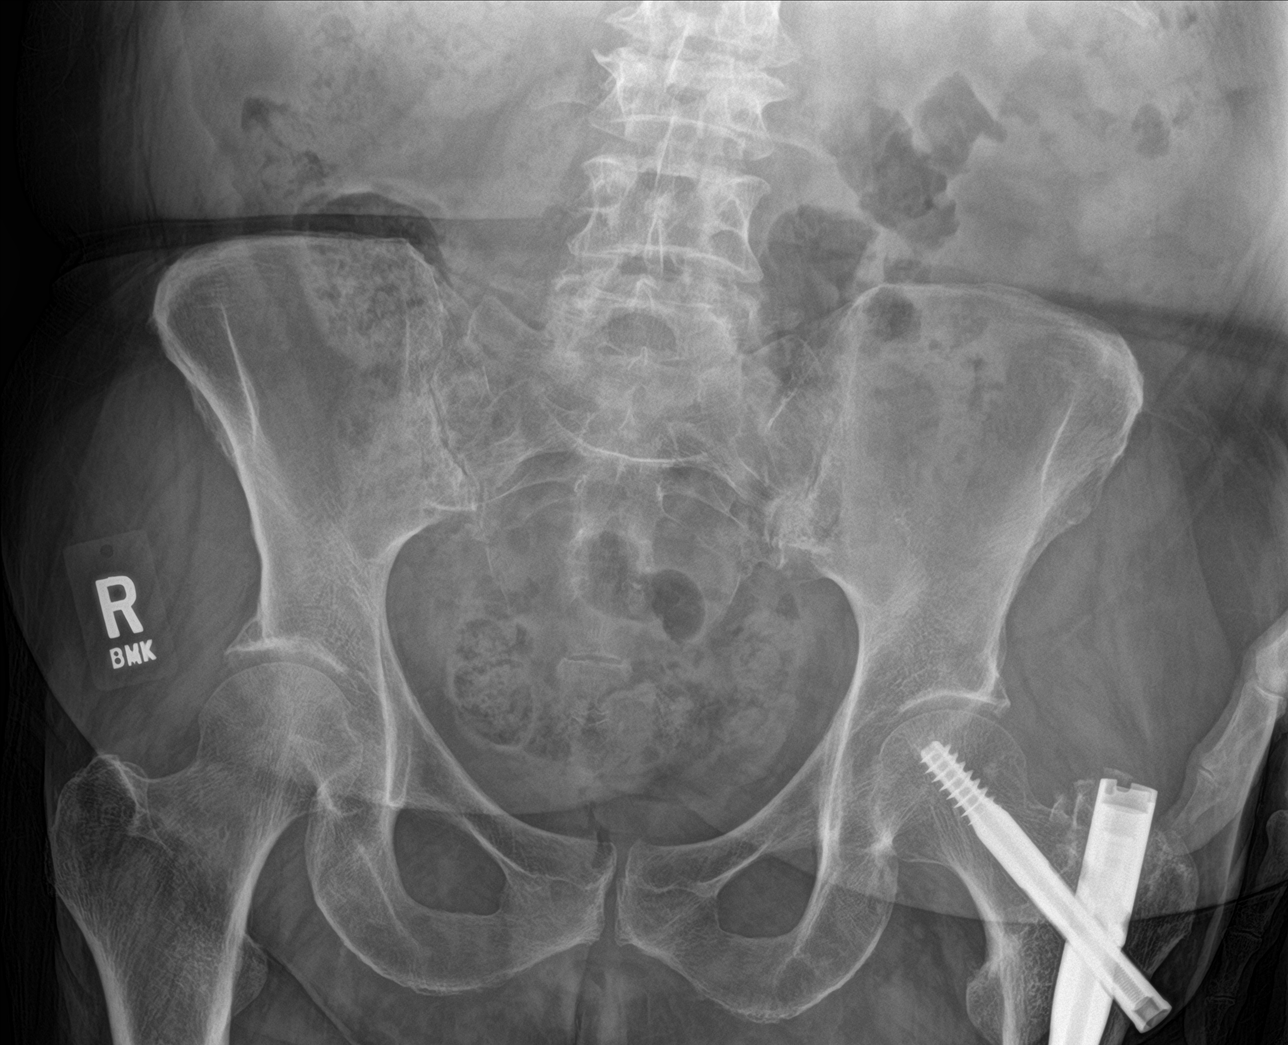

[4 of 4 positions shown; findings below may reference images not displayed]

FINDINGS: The cortical margins of the bony pelvis and right hip are intact. No
fracture. Pubic symphysis and sacroiliac joints are congruent. Both
femoral heads are well-seated in the respective acetabula. Mild
acetabular spurring bilaterally. Intramedullary rod with fixating
screws in the left femur, hardware intact were visualized.
IMPRESSION: No acute fracture of the right hip.

## 2017-09-14 IMAGING — CT CT CERVICAL SPINE W/O CM
4 of 7 series · 13 of 33 positions shown, 14 images · non-contrast
Comparison: None.

CLINICAL DATA: Fall, hit back of head.  Neck soreness.

EXAM:
CT HEAD WITHOUT CONTRAST
CT CERVICAL SPINE WITHOUT CONTRAST
TECHNIQUE: Multidetector CT imaging of the head and cervical spine was
performed following the standard protocol without intravenous
contrast. Multiplanar CT image reconstructions of the cervical spine
were also generated.

[Series 4: coronal soft tissue · coronal · 0.30mm/px · 3 of 63 slices shown]
[im 16/63  bone]
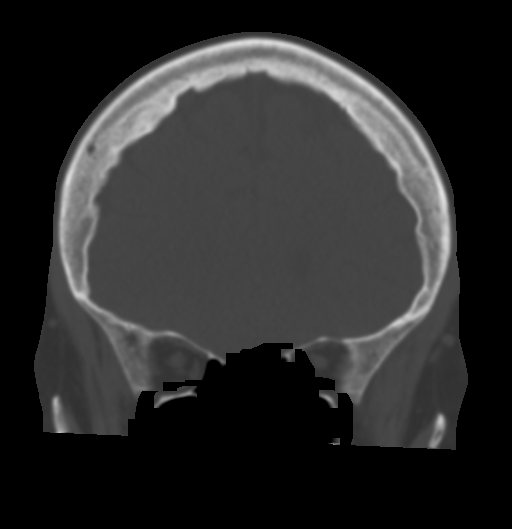
[im 32/63  bone]
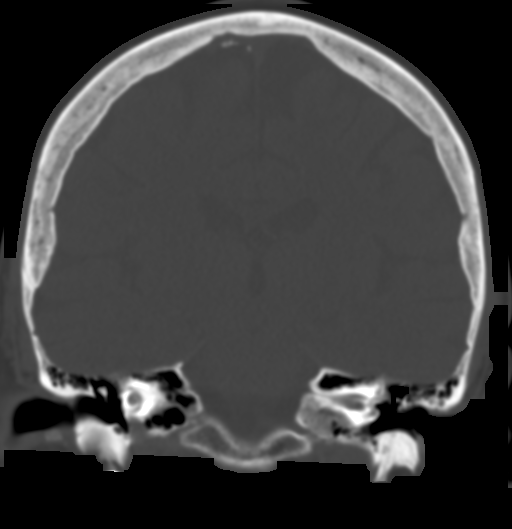
[im 47/63  bone]
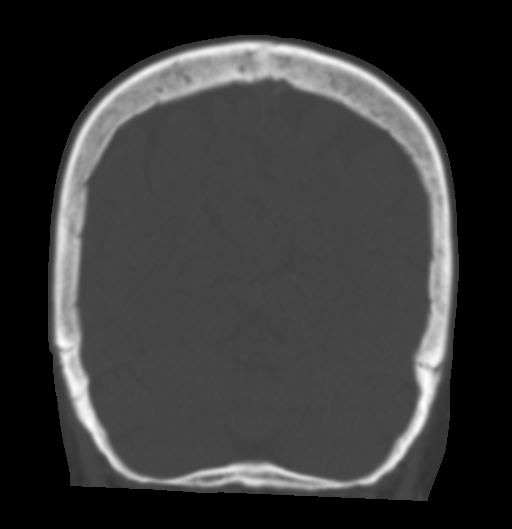

[Series 7: c spine soft · axial · 0.29mm/px · z∈[-195,-159]mm · 2 of 74 slices shown]
[im 19/74  soft-tissue]
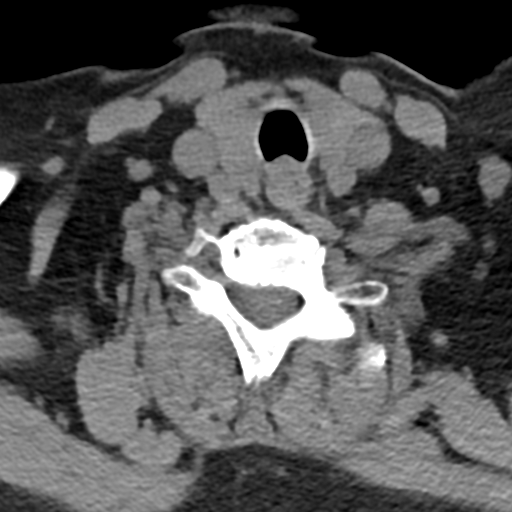
[im 37/74  soft-tissue]
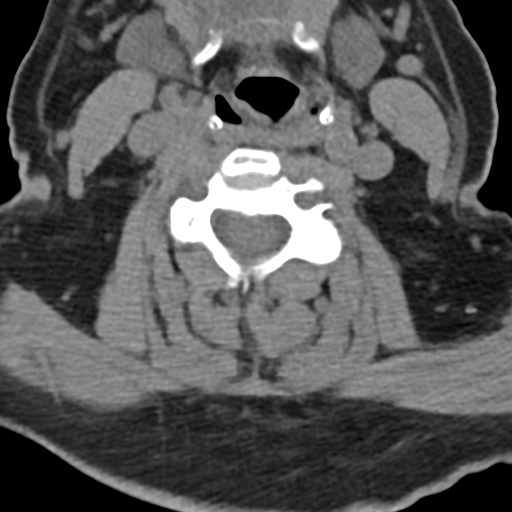

[Series 8: sagittal bone · sagittal · 0.25mm/px · 4 of 38 slices shown]
[im 8/38  bone]
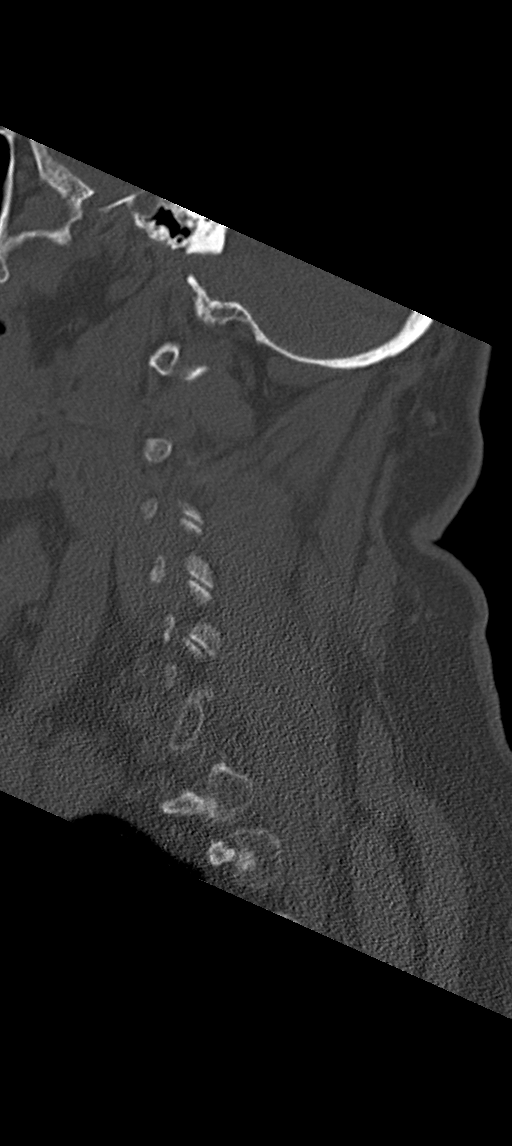
[im 15/38  bone]
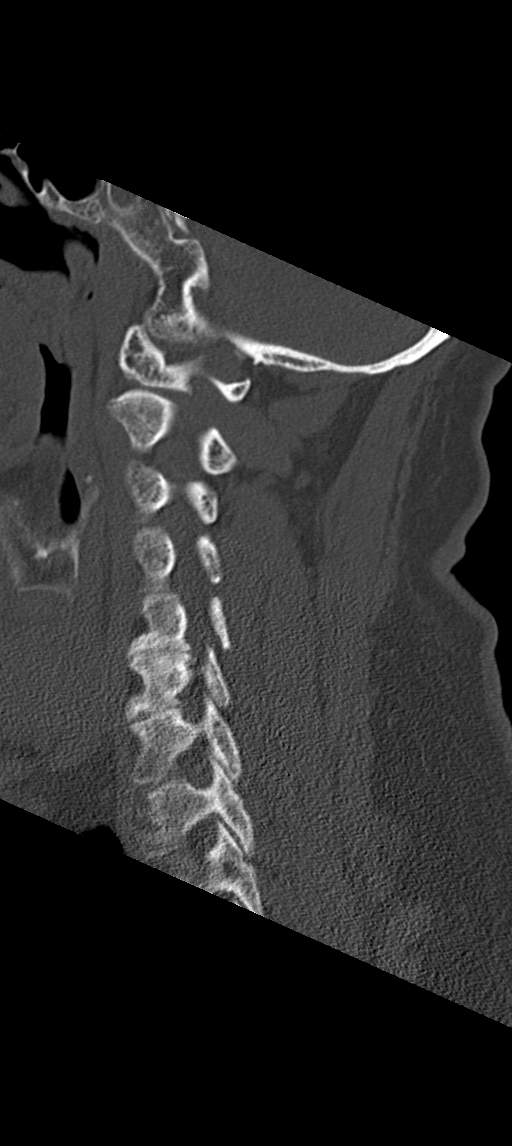
[im 23/38  bone]
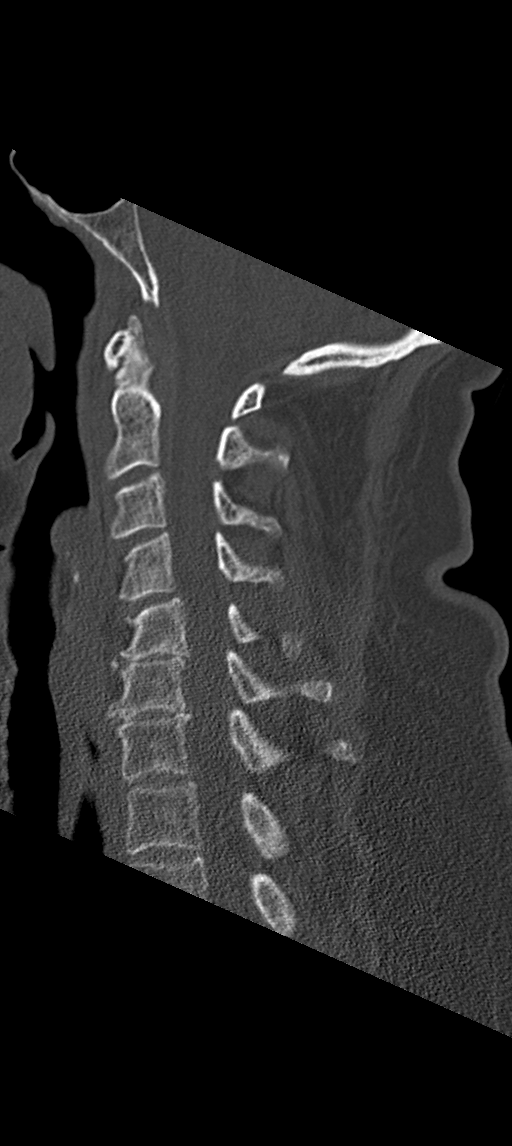
[im 30/38  bone]
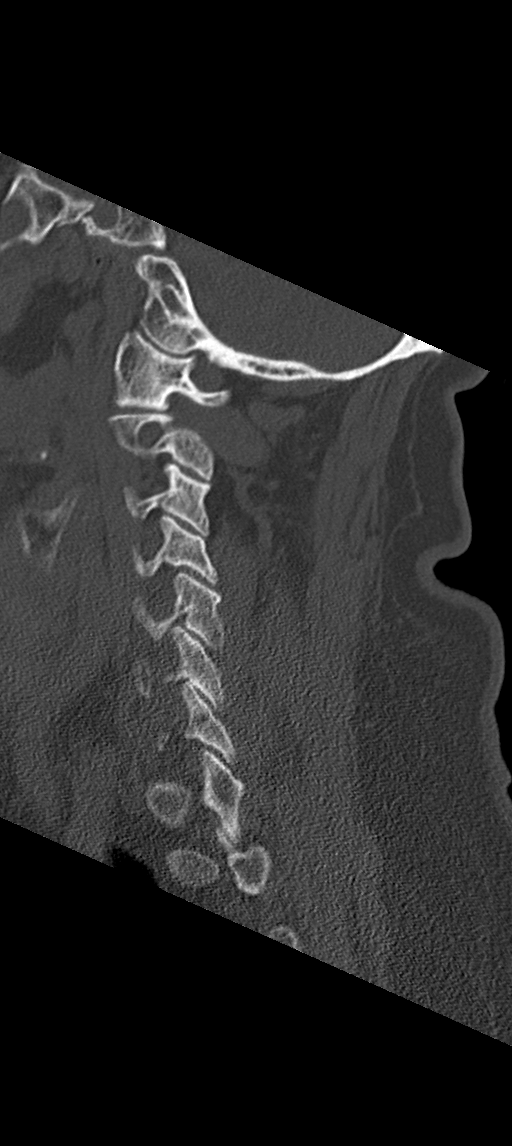

[Series 10: orthogonal bone · axial · 0.22mm/px · z∈[-237,-144]mm · 4 of 86 slices shown, 5 images]
[im 18/86  soft-tissue]
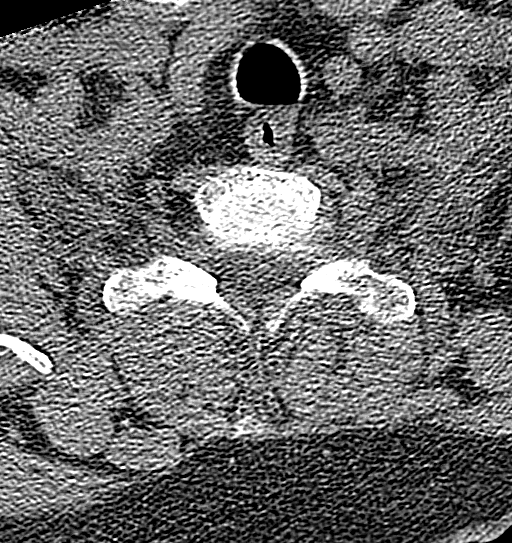
[im 18/86  bone]
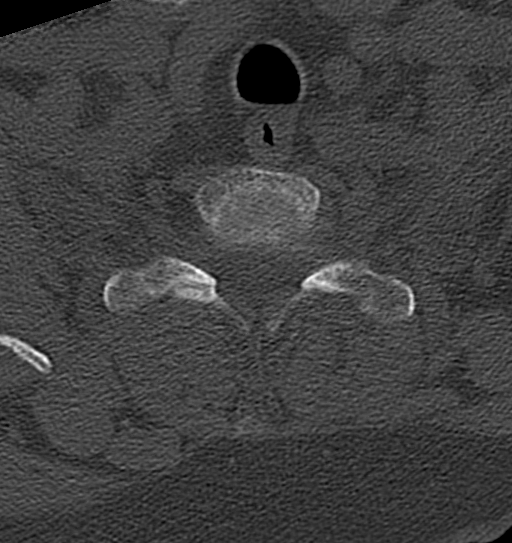
[im 35/86  bone]
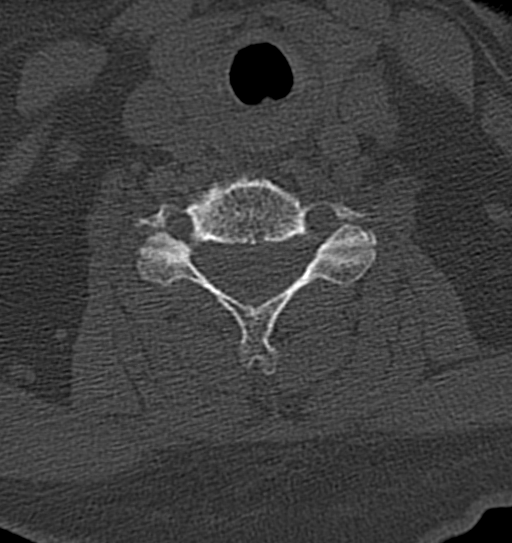
[im 52/86  bone]
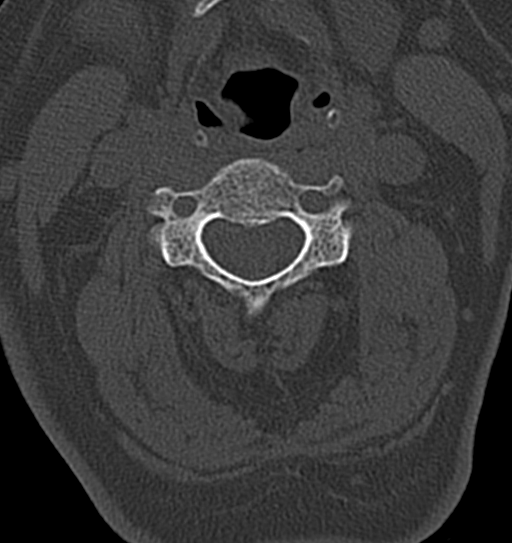
[im 69/86  bone]
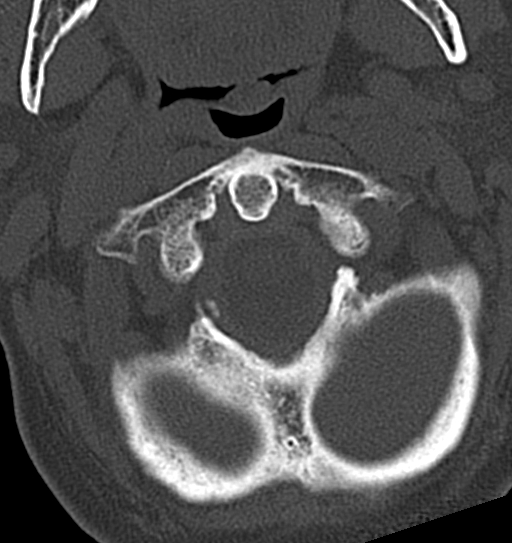

[13 of 33 positions shown; findings below may reference images not displayed]

FINDINGS: CT HEAD FINDINGS

Brain: No acute intracranial abnormality. Specifically, no
hemorrhage, hydrocephalus, mass lesion, acute infarction, or
significant intracranial injury.

Vascular: No hyperdense vessel or unexpected calcification.

Skull: No acute calvarial abnormality.

Sinuses/Orbits: Visualized paranasal sinuses and mastoids clear.
Orbital soft tissues unremarkable.

Other: None

CT CERVICAL SPINE FINDINGS

Alignment: No subluxation

Skull base and vertebrae: No acute fracture. No primary bone lesion
or focal pathologic process.

Soft tissues and spinal canal: No prevertebral fluid or swelling. No
visible canal hematoma.

Disc levels:  Disc space narrowing and spurring at C5-6 and C6-7.

Upper chest: No acute findings

Other: No acute findings
IMPRESSION: No acute intracranial abnormality.

Degenerative changes in the cervical spine. No acute bony
abnormality.

## 2017-09-14 NOTE — ED Triage Notes (Signed)
See paper chart for downtime documentation 

## 2017-09-14 NOTE — ED Notes (Signed)
Pt verbalized understanding of discharge instructions and received paperwork. Downtime during discharge.

## 2017-10-11 ENCOUNTER — Ambulatory Visit
Admission: RE | Admit: 2017-10-11 | Discharge: 2017-10-11 | Disposition: A | Discharge status: Home / Self Care | Payer: Medicare Other | Source: Ambulatory Visit | Attending: Internal Medicine | Admitting: Internal Medicine

## 2017-10-11 DIAGNOSIS — Z1231 Encounter for screening mammogram for malignant neoplasm of breast: Secondary | ICD-10-CM | POA: Diagnosis present

## 2017-10-12 ENCOUNTER — Encounter (INDEPENDENT_AMBULATORY_CARE_PROVIDER_SITE_OTHER): Payer: Self-pay | Admitting: Ophthalmology

## 2017-10-12 DIAGNOSIS — Z8781 Personal history of (healed) traumatic fracture: Secondary | ICD-10-CM | POA: Insufficient documentation

## 2017-10-12 DIAGNOSIS — E1142 Type 2 diabetes mellitus with diabetic polyneuropathy: Secondary | ICD-10-CM | POA: Insufficient documentation

## 2017-10-12 DIAGNOSIS — E1159 Type 2 diabetes mellitus with other circulatory complications: Secondary | ICD-10-CM | POA: Insufficient documentation

## 2017-10-13 ENCOUNTER — Encounter (INDEPENDENT_AMBULATORY_CARE_PROVIDER_SITE_OTHER): Payer: Self-pay | Admitting: Vascular Surgery

## 2017-10-13 ENCOUNTER — Ambulatory Visit (INDEPENDENT_AMBULATORY_CARE_PROVIDER_SITE_OTHER): Payer: Medicare Other

## 2017-10-13 ENCOUNTER — Ambulatory Visit (INDEPENDENT_AMBULATORY_CARE_PROVIDER_SITE_OTHER): Payer: Medicare Other | Admitting: Vascular Surgery

## 2017-10-13 VITALS — BP 131/78 | HR 81 | Resp 16 | Ht 66.0 in | Wt 164.8 lb

## 2017-10-13 DIAGNOSIS — I7025 Atherosclerosis of native arteries of other extremities with ulceration: Secondary | ICD-10-CM | POA: Diagnosis not present

## 2017-10-13 DIAGNOSIS — I739 Peripheral vascular disease, unspecified: Secondary | ICD-10-CM

## 2017-10-13 DIAGNOSIS — L97529 Non-pressure chronic ulcer of other part of left foot with unspecified severity: Secondary | ICD-10-CM

## 2017-10-13 DIAGNOSIS — Z87891 Personal history of nicotine dependence: Secondary | ICD-10-CM

## 2017-10-13 DIAGNOSIS — I10 Essential (primary) hypertension: Secondary | ICD-10-CM | POA: Diagnosis not present

## 2017-10-13 DIAGNOSIS — I6523 Occlusion and stenosis of bilateral carotid arteries: Secondary | ICD-10-CM | POA: Diagnosis not present

## 2017-10-13 DIAGNOSIS — E1165 Type 2 diabetes mellitus with hyperglycemia: Secondary | ICD-10-CM

## 2017-10-13 NOTE — Progress Notes (Signed)
MRN : [id number]  Cheryl Leonard is a 72 y.o. (Sep 11, 1945) female who presents with chief complaint of  Chief Complaint  Patient presents with  . Follow-up    ultrasound follow up  .  History of Present Illness: Patient returns today in follow up of her peripheral arterial disease.  She saw her podiatrist earlier this week and her left foot ulceration has not progressed and is not healing.  As such, her podiatrist recommended she return to see [place].  She already had this appointment scheduled from 6 months prior when her perfusion was good and she was doing well.  The foot is a little more painful and the ulcer itself hurts.  She does describe some claudication symptoms in the legs.  Her ABIs today have dropped bilaterally and are now 0.69 on the right and 0.61 on the left with biphasic waveforms on the right and monophasic waveforms on the left.  This is a significant drop from normal ABIs 6 months ago.  The left leg is the leg that underwent intervention earlier this year as the current ulceration.  No fevers or chills.  No chest pain or shortness of breath.  Current Outpatient Medications  Medication Sig Dispense Refill  . ALPRAZolam (XANAX) 0.25 MG tablet Take 0.25 mg by mouth at bedtime as needed for sleep.     [name] amoxicillin (AMOXIL) 500 MG capsule BEGINNING MORNING OF PROCEDURE TAKE ONE CAPSULE 4 TIMES A DAY UNTIL FINISHED  0  . aspirin EC 81 MG tablet Take 81 mg by mouth daily.     [name] atorvastatin (LIPITOR) 20 MG tablet Take 20 mg by mouth at bedtime.  3  . buPROPion (WELLBUTRIN XL) 150 MG 24 hr tablet Take 150 mg by mouth daily. Take with 300 mg to equal 450 mg once daily    . buPROPion (WELLBUTRIN XL) 150 MG 24 hr tablet Take by mouth.    [name] buPROPion (WELLBUTRIN XL) 300 MG 24 hr tablet Take 300 mg by mouth daily. Take with 150 mg to equal 450 mg once daily  11  . BYSTOLIC 5 MG tablet Take 5 mg by mouth daily.  2  . chlorhexidine (PERIDEX) 0.12 % solution RINSE FOR 30 SECONDS WITH  0.5 OZ TWICE A DAY  0  . cholecalciferol (VITAMIN D) 1000 units tablet Take 1,000 Units by mouth 2 (two) times daily.    [name] CINNAMON PO Take 1 tablet by mouth daily.    . clopidogrel (PLAVIX) 75 MG tablet Take 1 tablet (75 mg total) by mouth daily. 30 tablet 11  . CORAL CALCIUM PO Take 1 tablet by mouth daily.    [name] denosumab (PROLIA) 60 MG/ML SOLN injection Inject into the skin.    . DULoxetine (CYMBALTA) 60 MG capsule Take 60 mg by mouth 2 (two) times daily.     [name] gemfibrozil (LOPID) 600 MG tablet Take 600 mg by mouth 2 (two) times daily.  3  . Insulin Pen Needle (B-D UF III MINI PEN NEEDLES) 31G X 5 MM MISC once daily.    . Iron-Vitamin C (VITRON-C) 65-125 MG TABS Take 1 tablet by mouth daily.     [name] JANUMET 50-1000 MG per tablet Take 1 tablet by mouth 2 (two) times daily with a meal.  3  . levothyroxine (SYNTHROID, LEVOTHROID) 100 MCG tablet Take 100 mcg by mouth daily.  3  . lisinopril (PRINIVIL,ZESTRIL) 10 MG tablet Take 10 mg by mouth daily.     . Magnesium 500  MG TABS Take 500 mg by mouth daily.    . Multiple Vitamin (MULTIVITAMIN WITH MINERALS) TABS tablet Take 1 tablet by mouth daily.    . Omega-3 Fatty Acids (FISH OIL) 1000 MG CAPS Take 1,000 mg by mouth daily.    . pantoprazole (PROTONIX) 40 MG tablet Take 40 mg by mouth daily.     . ranitidine (ZANTAC) 300 MG capsule Take 300 mg by mouth at bedtime.  1  . TRESIBA FLEXTOUCH 200 UNIT/ML SOPN INJECT 30 UNITS SUB-Q NIGHTLY  5  . vitamin B-12 (CYANOCOBALAMIN) 1000 MCG tablet Take 1,000 mcg by mouth daily.    . vitamin E 400 UNIT capsule Take 400 Units by mouth daily.    [name] zolpidem (AMBIEN) 10 MG tablet Take 10 mg by mouth at bedtime.  5   Current Facility-Administered Medications  Medication Dose Route Frequency Provider Last Rate Last Dose  . aflibercept (EYLEA) SOLN 2 mg  2 mg Intravitreal  [name], MD   2 mg at 06/27/17 1307  . aflibercept (EYLEA) SOLN 2 mg  2 mg Intravitreal  [name], MD   2 mg at 07/26/17 1702  .  aflibercept (EYLEA) SOLN 2 mg  2 mg Intravitreal  [name], MD   2 mg at 09/06/17 1700  . Bevacizumab (AVASTIN) SOLN 1.25 mg  1.25 mg Intravitreal  [name], MD   1.25 mg at 02/10/17 0953  . Bevacizumab (AVASTIN) SOLN 1.25 mg  1.25 mg Intravitreal  [name], MD   1.25 mg at 03/13/17 1717  . Bevacizumab (AVASTIN) SOLN 1.25 mg  1.25 mg Intravitreal  [name], MD   1.25 mg at 04/11/17 1626    Past Medical History:  Diagnosis Date  . Anxiety   . Arthritis   . Cataracts, both eyes   . GERD (gastroesophageal reflux disease)   . Gout   . History of fracture of patella    right knee  . History of positive PPD    Patient always shows positive  . Hyperlipidemia   . Hypertension   . Hypothyroidism   . Lichen sclerosus 12/30/2013   of vulva  . Metatarsal fracture   . Neuropathy   . Peripheral vascular disease (HCC)   . Polyneuropathy    numbness and tingling in feet and toes  . Type 2 diabetes mellitus, uncontrolled (HCC)     Past Surgical History:  Procedure Laterality Date  . APPENDECTOMY    . BREAST REDUCTION SURGERY  2001  . CATARACT EXTRACTION    . CESAREAN SECTION  1976  . COLONOSCOPY  03/05/2013   Nml - due for repeat 03/06/2018  . DILATION AND CURETTAGE OF UTERUS  1989  . ESOPHAGOGASTRODUODENOSCOPY  03/05/2013  . EYE SURGERY    . Eyelid Surgery  2012  . INTRAMEDULLARY (IM) NAIL INTERTROCHANTERIC Left 10/30/2015   Procedure: INTRAMEDULLARY (IM) NAIL INTERTROCHANTRIC ;  Surgeon: Kennedy Bucker, MD;  Location: [facility] ORS;  Service: Orthopedics;  Laterality: Left;  . LAPAROSCOPIC HYSTERECTOMY  2000   total  . LOWER EXTREMITY ANGIOGRAPHY Left 03/08/2017   Procedure: LOWER EXTREMITY ANGIOGRAPHY;  Surgeon: Annice Needy, MD;  Location: [facility];  Service: Cardiovascular;  Laterality: Left;  . REDUCTION MAMMAPLASTY  1997    Social History        Tobacco Use  . Smoking status: Former Smoker    Packs/day: 1.00    Years: 20.00    Pack years: 20.00     Types: Cigarettes    Last  attempt to quit: 03/07/1996    Years since quitting: 21.3  . Smokeless tobacco: Never Used  . Tobacco comment: started smoking at age 32  Substance Use Topics  . Alcohol use: No    Alcohol/week: 0.0 oz  . Drug use: No     Family History      Family History  Problem Relation Age of Onset  . Coronary artery disease Father   . Heart attack Father   . Coronary artery disease Mother   . Heart attack Mother   . Ovarian cancer Sister 71       sister had hormonal therapy for IVF txs-which increased risk factor for ovarian cancer  . Breast cancer Neg Hx      No Known Allergies   REVIEW OF SYSTEMS (Negative unless checked)  Constitutional: [] Weight loss  [] Fever  [] Chills Cardiac: [] Chest pain   [] Chest pressure   [] Palpitations   [] Shortness of breath when laying flat   [] Shortness of breath at rest   [] Shortness of breath with exertion. Vascular:  [] Pain in legs with walking   [] Pain in legs at rest   [] Pain in legs when laying flat   [x] Claudication   [] Pain in feet when walking  [] Pain in feet at rest  [] Pain in feet when laying flat   [] History of DVT   [] Phlebitis   [] Swelling in legs   [] Varicose veins   [x] Non-healing ulcers Pulmonary:   [] Uses home oxygen   [] Productive cough   [] Hemoptysis   [] Wheeze  [] COPD   [] Asthma Neurologic:  [] Dizziness  [] Blackouts   [] Seizures   [] History of stroke   [] History of TIA  [] Aphasia   [] Temporary blindness   [] Dysphagia   [] Weakness or numbness in arms   [] Weakness or numbness in legs Musculoskeletal:  [x] Arthritis   [] Joint swelling   [] Joint pain   [] Low back pain Hematologic:  [] Easy bruising  [] Easy bleeding   [] Hypercoagulable state   [] Anemic  [] Hepatitis Gastrointestinal:  [] Blood in stool   [] Vomiting blood  [] Gastroesophageal reflux/heartburn   [] Difficulty swallowing. Genitourinary:  [] Chronic kidney disease   [] Difficult urination  [] Frequent urination  [] Burning with urination    [] Blood in urine Skin:  [] Rashes   [x] Ulcers   [x] Wounds Psychological:  [] History of anxiety   []  History of major depression.    Physical Examination  BP 131/78 (BP Location: Right Arm)   Pulse 81   Resp 16   Ht 5\' 6"  (1.676 m)   Wt 164 lb 12.8 oz (74.8 kg)   BMI 26.60 kg/m  Gen:  WD/WN, NAD Head: [place]/AT, No temporalis wasting. Ear/Nose/Throat: Hearing grossly intact, nares w/o erythema or drainage Eyes: Conjunctiva clear. Sclera non-icteric Neck: Supple.  Trachea midline Pulmonary:  Good air movement, no use of accessory muscles.  Cardiac: RRR, no JVD Vascular:  Vessel Right Left  Radial Palpable Palpable                          PT 1+ Palpable 1+ Palpable  DP 1+ Palpable Not Palpable   Gastrointestinal: soft, non-tender/non-distended.  Musculoskeletal: M/S 5/5 throughout.  Left foot in a post op shoe.  Wound is currently dressed. Neurologic: Sensation grossly intact in extremities.  Symmetrical.  Speech is fluent.  Psychiatric: Judgment intact, Mood & affect appropriate for pt's clinical situation. Dermatologic: left foot ulceration dressed today       Labs No results found for this or any previous visit (from the past 2160  hour(s)).  Radiology Dg Ribs Unilateral W/chest Right  Result Date: 09/14/2017 CLINICAL DATA:  Post fall with right rib pain. EXAM: RIGHT RIBS AND CHEST - 3+ VIEW COMPARISON:  10/31/2015 FINDINGS: Minimally displaced fracture of right lateral ninth rib. No additional rib fracture. No pulmonary complication such as pneumothorax or pulmonary contusion. No pleural fluid. Normal heart size and mediastinal contours. IMPRESSION: Minimally displaced right lateral ninth rib fracture without pulmonary complication. Electronically Signed   By: Narda Rutherford M.D.   On: 09/14/2017 07:24   Ct Head Wo Contrast  Result Date: 09/14/2017 CLINICAL DATA:  Fall, hit back of head.  Neck soreness. EXAM: CT HEAD WITHOUT CONTRAST CT CERVICAL SPINE WITHOUT  CONTRAST TECHNIQUE: Multidetector CT imaging of the head and cervical spine was performed following the standard protocol without intravenous contrast. Multiplanar CT image reconstructions of the cervical spine were also generated. COMPARISON:  None. FINDINGS: CT HEAD FINDINGS Brain: No acute intracranial abnormality. Specifically, no hemorrhage, hydrocephalus, mass lesion, acute infarction, or significant intracranial injury. Vascular: No hyperdense vessel or unexpected calcification. Skull: No acute calvarial abnormality. Sinuses/Orbits: Visualized paranasal sinuses and mastoids clear. Orbital soft tissues unremarkable. Other: None CT CERVICAL SPINE FINDINGS Alignment: No subluxation Skull base and vertebrae: No acute fracture. No primary bone lesion or focal pathologic process. Soft tissues and spinal canal: No prevertebral fluid or swelling. No visible canal hematoma. Disc levels:  Disc space narrowing and spurring at C5-6 and C6-7. Upper chest: No acute findings Other: No acute findings IMPRESSION: No acute intracranial abnormality. Degenerative changes in the cervical spine. No acute bony abnormality. Electronically Signed   By: Charlett Nose M.D.   On: 09/14/2017 07:25   Ct Cervical Spine Wo Contrast  Result Date: 09/14/2017 CLINICAL DATA:  Fall, hit back of head.  Neck soreness. EXAM: CT HEAD WITHOUT CONTRAST CT CERVICAL SPINE WITHOUT CONTRAST TECHNIQUE: Multidetector CT imaging of the head and cervical spine was performed following the standard protocol without intravenous contrast. Multiplanar CT image reconstructions of the cervical spine were also generated. COMPARISON:  None. FINDINGS: CT HEAD FINDINGS Brain: No acute intracranial abnormality. Specifically, no hemorrhage, hydrocephalus, mass lesion, acute infarction, or significant intracranial injury. Vascular: No hyperdense vessel or unexpected calcification. Skull: No acute calvarial abnormality. Sinuses/Orbits: Visualized paranasal sinuses and  mastoids clear. Orbital soft tissues unremarkable. Other: None CT CERVICAL SPINE FINDINGS Alignment: No subluxation Skull base and vertebrae: No acute fracture. No primary bone lesion or focal pathologic process. Soft tissues and spinal canal: No prevertebral fluid or swelling. No visible canal hematoma. Disc levels:  Disc space narrowing and spurring at C5-6 and C6-7. Upper chest: No acute findings Other: No acute findings IMPRESSION: No acute intracranial abnormality. Degenerative changes in the cervical spine. No acute bony abnormality. Electronically Signed   By: Charlett Nose M.D.   On: 09/14/2017 07:25   Mm 3d Screen Breast Bilateral  Result Date: 10/11/2017 CLINICAL DATA:  Screening. EXAM: DIGITAL SCREENING BILATERAL MAMMOGRAM WITH TOMO AND CAD COMPARISON:  Previous exam(s). ACR Breast Density Category c: The breast tissue is heterogeneously dense, which may obscure small masses. FINDINGS: There are no findings suspicious for malignancy. Images were processed with CAD. IMPRESSION: No mammographic evidence of malignancy. A result letter of this screening mammogram will be mailed directly to the patient. RECOMMENDATION: Screening mammogram in one year. (Code:SM-B-01Y) BI-RADS CATEGORY  1: Negative. Electronically Signed   By: Beckie Salts M.D.   On: 10/11/2017 12:49   Dg Hip Unilat With Pelvis 2-3 Views Right  Result Date: 09/14/2017  CLINICAL DATA:  Right hip pain after fall. EXAM: DG HIP (WITH OR WITHOUT PELVIS) 2-3V RIGHT COMPARISON:  None. FINDINGS: The cortical margins of the bony pelvis and right hip are intact. No fracture. Pubic symphysis and sacroiliac joints are congruent. Both femoral heads are well-seated in the respective acetabula. Mild acetabular spurring bilaterally. Intramedullary rod with fixating screws in the left femur, hardware intact were visualized. IMPRESSION: No acute fracture of the right hip. Electronically Signed   By: Narda Rutherford M.D.   On: 09/14/2017 07:24     Assessment/Plan Essential hypertension blood pressure control important in reducing the progression of atherosclerotic disease. On appropriate oral medications.   Diabetes mellitus type 2, uncontrolled blood glucose control important in reducing the progression of atherosclerotic disease. Also, involved in wound healing. On appropriate medications.   Carotid atherosclerosis, bilateral Her carotid duplex earlier this year shows mild carotid plaque bilaterally without hemodynamically significant stenosis on either side (stenosis of less than 40%). This is unlikely the cause of of her retinal disease. Her risk of stroke is minimal and no surgery would be recommended. Continue ASA and statin agent. Recheck in 2-3 years.  Atherosclerosis of native arteries of the extremities with ulceration (HCC) Her ABIs today have dropped bilaterally and are now 0.69 on the right and 0.61 on the left with biphasic waveforms on the right and monophasic waveforms on the left.  This is a significant drop from normal ABIs 6 months ago.  With her nonhealing ulceration, this is a critical and limb threatening situation.  I suspect her previous intervention has reoccluded or at least has restenosis.  As such, I would recommend proceeding with an angiogram in the near future to evaluate the blood flow and potentially improve her circulation.  I have discussed the risks and benefits of the procedure.  I have discussed the serious nature of the situation and stress that this is a limb threatening situation.  The patient voices her understanding and is agreeable to proceed.    Festus Barren, MD  10/13/2017 3:58 PM    This note was created with Dragon medical transcription system.  Any errors from dictation are purely unintentional

## 2017-10-13 NOTE — Assessment & Plan Note (Signed)
Her ABIs today have dropped bilaterally and are now 0.69 on the right and 0.61 on the left with biphasic waveforms on the right and monophasic waveforms on the left.  This is a significant drop from normal ABIs 6 months ago.  With her nonhealing ulceration, this is a critical and limb threatening situation.  I suspect her previous intervention has reoccluded or at least has restenosis.  As such, I would recommend proceeding with an angiogram in the near future to evaluate the blood flow and potentially improve her circulation.  I have discussed the risks and benefits of the procedure.  I have discussed the serious nature of the situation and stress that this is a limb threatening situation.  The patient voices her understanding and is agreeable to proceed.

## 2017-10-13 NOTE — Patient Instructions (Signed)

## 2017-10-16 ENCOUNTER — Encounter (INDEPENDENT_AMBULATORY_CARE_PROVIDER_SITE_OTHER): Payer: Self-pay

## 2017-10-17 ENCOUNTER — Other Ambulatory Visit (INDEPENDENT_AMBULATORY_CARE_PROVIDER_SITE_OTHER): Payer: Self-pay | Admitting: Vascular Surgery

## 2017-10-27 ENCOUNTER — Encounter
Admission: RE | Admit: 2017-10-27 | Discharge: 2017-10-27 | Disposition: A | Discharge status: Home / Self Care | Payer: Medicare Other | Source: Ambulatory Visit | Attending: Vascular Surgery | Admitting: Vascular Surgery

## 2017-10-27 DIAGNOSIS — I7025 Atherosclerosis of native arteries of other extremities with ulceration: Secondary | ICD-10-CM | POA: Insufficient documentation

## 2017-10-27 HISTORY — DX: Disorder of kidney and ureter, unspecified: N28.9

## 2017-10-27 LAB — CREATININE, SERUM
CREATININE: 1.27 mg/dL — AB (ref 0.44–1.00)
GFR calc Af Amer: 48 mL/min — ABNORMAL LOW (ref >60)
GFR calc non Af Amer: 41 mL/min — ABNORMAL LOW (ref >60)

## 2017-10-27 LAB — BUN: BUN: 27 mg/dL — ABNORMAL HIGH (ref 8–23)

## 2017-10-30 ENCOUNTER — Encounter
Admission: RE | Disposition: A | Discharge status: Home / Self Care | Payer: Self-pay | Source: Ambulatory Visit | Attending: Vascular Surgery

## 2017-10-30 ENCOUNTER — Ambulatory Visit
Admission: RE | Admit: 2017-10-30 | Discharge: 2017-10-30 | Disposition: A | Discharge status: Home / Self Care | Payer: Medicare Other | Source: Ambulatory Visit | Attending: Vascular Surgery | Admitting: Vascular Surgery

## 2017-10-30 ENCOUNTER — Other Ambulatory Visit: Payer: Self-pay

## 2017-10-30 DIAGNOSIS — E11621 Type 2 diabetes mellitus with foot ulcer: Secondary | ICD-10-CM | POA: Insufficient documentation

## 2017-10-30 DIAGNOSIS — E785 Hyperlipidemia, unspecified: Secondary | ICD-10-CM | POA: Diagnosis not present

## 2017-10-30 DIAGNOSIS — I70245 Atherosclerosis of native arteries of left leg with ulceration of other part of foot: Secondary | ICD-10-CM | POA: Diagnosis present

## 2017-10-30 DIAGNOSIS — E039 Hypothyroidism, unspecified: Secondary | ICD-10-CM | POA: Diagnosis not present

## 2017-10-30 DIAGNOSIS — I70299 Other atherosclerosis of native arteries of extremities, unspecified extremity: Secondary | ICD-10-CM

## 2017-10-30 DIAGNOSIS — Z955 Presence of coronary angioplasty implant and graft: Secondary | ICD-10-CM | POA: Insufficient documentation

## 2017-10-30 DIAGNOSIS — H269 Unspecified cataract: Secondary | ICD-10-CM | POA: Diagnosis not present

## 2017-10-30 DIAGNOSIS — Z9889 Other specified postprocedural states: Secondary | ICD-10-CM | POA: Diagnosis not present

## 2017-10-30 DIAGNOSIS — Z7982 Long term (current) use of aspirin: Secondary | ICD-10-CM | POA: Insufficient documentation

## 2017-10-30 DIAGNOSIS — E1142 Type 2 diabetes mellitus with diabetic polyneuropathy: Secondary | ICD-10-CM | POA: Diagnosis not present

## 2017-10-30 DIAGNOSIS — Z794 Long term (current) use of insulin: Secondary | ICD-10-CM | POA: Insufficient documentation

## 2017-10-30 DIAGNOSIS — K219 Gastro-esophageal reflux disease without esophagitis: Secondary | ICD-10-CM | POA: Diagnosis not present

## 2017-10-30 DIAGNOSIS — Z8249 Family history of ischemic heart disease and other diseases of the circulatory system: Secondary | ICD-10-CM | POA: Diagnosis not present

## 2017-10-30 DIAGNOSIS — I739 Peripheral vascular disease, unspecified: Secondary | ICD-10-CM | POA: Diagnosis not present

## 2017-10-30 DIAGNOSIS — M1990 Unspecified osteoarthritis, unspecified site: Secondary | ICD-10-CM | POA: Diagnosis not present

## 2017-10-30 DIAGNOSIS — L97521 Non-pressure chronic ulcer of other part of left foot limited to breakdown of skin: Secondary | ICD-10-CM | POA: Insufficient documentation

## 2017-10-30 DIAGNOSIS — I10 Essential (primary) hypertension: Secondary | ICD-10-CM | POA: Diagnosis not present

## 2017-10-30 DIAGNOSIS — L97909 Non-pressure chronic ulcer of unspecified part of unspecified lower leg with unspecified severity: Secondary | ICD-10-CM

## 2017-10-30 DIAGNOSIS — I6523 Occlusion and stenosis of bilateral carotid arteries: Secondary | ICD-10-CM | POA: Diagnosis not present

## 2017-10-30 DIAGNOSIS — L97529 Non-pressure chronic ulcer of other part of left foot with unspecified severity: Secondary | ICD-10-CM | POA: Diagnosis not present

## 2017-10-30 DIAGNOSIS — M109 Gout, unspecified: Secondary | ICD-10-CM | POA: Insufficient documentation

## 2017-10-30 DIAGNOSIS — Z87891 Personal history of nicotine dependence: Secondary | ICD-10-CM | POA: Insufficient documentation

## 2017-10-30 DIAGNOSIS — Z90710 Acquired absence of both cervix and uterus: Secondary | ICD-10-CM | POA: Diagnosis not present

## 2017-10-30 DIAGNOSIS — Z79899 Other long term (current) drug therapy: Secondary | ICD-10-CM | POA: Diagnosis not present

## 2017-10-30 DIAGNOSIS — I70201 Unspecified atherosclerosis of native arteries of extremities, right leg: Secondary | ICD-10-CM | POA: Diagnosis not present

## 2017-10-30 HISTORY — PX: LOWER EXTREMITY ANGIOGRAPHY: CATH118251

## 2017-10-30 LAB — GLUCOSE, CAPILLARY
Glucose-Capillary: 75 mg/dL (ref 70–99)
Glucose-Capillary: 83 mg/dL (ref 70–99)

## 2017-10-30 SURGERY — LOWER EXTREMITY ANGIOGRAPHY
Anesthesia: Moderate Sedation | Site: Leg Lower | Laterality: Left

## 2017-10-30 MED ORDER — CLOPIDOGREL BISULFATE 75 MG PO TABS: 75.0000 mg | ORAL_TABLET | Freq: Every day | ORAL | 11 refills | Status: DC

## 2017-10-30 MED ORDER — CEFAZOLIN SODIUM-DEXTROSE 2-4 GM/100ML-% IV SOLN
INTRAVENOUS | Status: AC
  Administered 2017-10-30: 2 g via INTRAVENOUS
  Filled 2017-10-30: qty 100

## 2017-10-30 MED ORDER — HEPARIN SODIUM (PORCINE) 1000 UNIT/ML IJ SOLN
INTRAMUSCULAR | Status: AC
  Filled 2017-10-30: qty 1

## 2017-10-30 MED ORDER — LIDOCAINE-EPINEPHRINE (PF) 1 %-1:200000 IJ SOLN
INTRAMUSCULAR | Status: AC
  Filled 2017-10-30: qty 30

## 2017-10-30 MED ORDER — IOPAMIDOL (ISOVUE-300) INJECTION 61%
INTRAVENOUS | Status: DC | PRN
  Administered 2017-10-30: 65 mL via INTRA_ARTERIAL

## 2017-10-30 MED ORDER — SODIUM CHLORIDE 0.9 % IV BOLUS
250.0000 mL | Freq: Once | INTRAVENOUS | Status: AC
  Administered 2017-10-30: 250 mL via INTRAVENOUS

## 2017-10-30 MED ORDER — HEPARIN (PORCINE) IN NACL 1000-0.9 UT/500ML-% IV SOLN
INTRAVENOUS | Status: AC
  Filled 2017-10-30: qty 1000

## 2017-10-30 MED ORDER — FENTANYL CITRATE (PF) 100 MCG/2ML IJ SOLN
INTRAMUSCULAR | Status: DC | PRN
  Administered 2017-10-30: 25 ug via INTRAVENOUS
  Administered 2017-10-30: 50 ug via INTRAVENOUS
  Administered 2017-10-30: 25 ug via INTRAVENOUS

## 2017-10-30 MED ORDER — MIDAZOLAM HCL 5 MG/5ML IJ SOLN
INTRAMUSCULAR | Status: AC
  Filled 2017-10-30: qty 5

## 2017-10-30 MED ORDER — HYDROMORPHONE HCL 1 MG/ML IJ SOLN: 1.0000 mg | Freq: Once | INTRAMUSCULAR | Status: DC | PRN

## 2017-10-30 MED ORDER — FAMOTIDINE 20 MG PO TABS: 40.0000 mg | ORAL_TABLET | ORAL | Status: DC | PRN

## 2017-10-30 MED ORDER — HEPARIN SODIUM (PORCINE) 1000 UNIT/ML IJ SOLN
INTRAMUSCULAR | Status: DC | PRN
  Administered 2017-10-30: 5000 [IU] via INTRAVENOUS

## 2017-10-30 MED ORDER — SODIUM CHLORIDE 0.9 % IV SOLN
INTRAVENOUS | Status: DC
  Administered 2017-10-30: 10:00:00 via INTRAVENOUS

## 2017-10-30 MED ORDER — CEFAZOLIN SODIUM-DEXTROSE 2-4 GM/100ML-% IV SOLN
2.0000 g | Freq: Once | INTRAVENOUS | Status: AC
  Administered 2017-10-30: 2 g via INTRAVENOUS

## 2017-10-30 MED ORDER — MIDAZOLAM HCL 2 MG/2ML IJ SOLN
INTRAMUSCULAR | Status: DC | PRN
  Administered 2017-10-30: 1 mg via INTRAVENOUS
  Administered 2017-10-30: 2 mg via INTRAVENOUS
  Administered 2017-10-30: 1 mg via INTRAVENOUS

## 2017-10-30 MED ORDER — FENTANYL CITRATE (PF) 100 MCG/2ML IJ SOLN
INTRAMUSCULAR | Status: AC
  Filled 2017-10-30: qty 2

## 2017-10-30 MED ORDER — ONDANSETRON HCL 4 MG/2ML IJ SOLN: 4.0000 mg | Freq: Four times a day (QID) | INTRAMUSCULAR | Status: DC | PRN

## 2017-10-30 MED ORDER — METHYLPREDNISOLONE SODIUM SUCC 125 MG IJ SOLR: 125.0000 mg | INTRAMUSCULAR | Status: DC | PRN

## 2017-10-30 SURGICAL SUPPLY — 17 items
BALLN DORADO 6X200X135 (BALLOONS) ×3
BALLN LUTONIX 5X220X130 (BALLOONS) ×3
BALLOON DORADO 6X200X135 (BALLOONS) ×1 IMPLANT
BALLOON LUTONIX 5X220X130 (BALLOONS) ×1 IMPLANT
CATH BEACON 5 .038 100 VERT TP (CATHETERS) ×3 IMPLANT
CATH PIG 70CM (CATHETERS) ×3 IMPLANT
COVER PROBE U/S 5X48 (MISCELLANEOUS) ×3 IMPLANT
DEVICE PRESTO INFLATION (MISCELLANEOUS) ×3 IMPLANT
DEVICE STARCLOSE SE CLOSURE (Vascular Products) ×3 IMPLANT
GLIDEWIRE ADV .035X260CM (WIRE) ×3 IMPLANT
PACK ANGIOGRAPHY (CUSTOM PROCEDURE TRAY) ×3 IMPLANT
SHEATH ANL2 6FRX45 HC (SHEATH) ×3 IMPLANT
SHEATH BRITE TIP 5FRX11 (SHEATH) ×3 IMPLANT
STENT VIABAHN 6X250X120 (Permanent Stent) ×3 IMPLANT
TUBING CONTRAST HIGH PRESS 72 (TUBING) ×3 IMPLANT
WIRE G V18X300CM (WIRE) ×3 IMPLANT
WIRE J 3MM .035X145CM (WIRE) ×3 IMPLANT

## 2017-10-30 NOTE — H&P (Signed)
[facility] History & Physical Update  The patient was interviewed and re-examined.  The patient's previous History and Physical has been reviewed and is unchanged.  There is no change in the plan of care. We plan to proceed with the scheduled procedure.  Festus Barren, MD  10/30/2017, 10:21 AM

## 2017-10-30 NOTE — Op Note (Signed)
[facility]  Percutaneous Study/Intervention Procedural Note   Date of Surgery: 10/30/2017  Surgeon(s):Lorilyn Laitinen    Assistants:none  Pre-operative Diagnosis: PAD with ulceration LLE  Post-operative diagnosis:  Same  Procedure(s) Performed:             1.  Ultrasound guidance for vascular access right femoral artery             2.  Catheter placement into left SFA from right femoral approach             3.  Aortogram and selective right lower extremity angiogram             4.  Percutaneous transluminal angioplasty of left SFA and proximal popliteal artery with 5 mm diameter by 22 cm length Lutonix drug-coated angioplasty balloon             5.   Viabahn stent placement to the left SFA and proximal popliteal artery with 6 mm diameter by 25 cm length covered stent  6.  StarClose closure device right femoral artery  EBL: 5 cc  Contrast: 65 cc  Fluoro Time: 5.3 minutes  Moderate Conscious Sedation Time: approximately 35 minutes using 4 mg of Versed and 100 Mcg of Fentanyl              Indications:  Patient is a 72 y.o.female with nonhealing ulceration of the left foot. The patient has noninvasive study showing a drop in her ABI to 0.6 on the left after previously having been normal a few months ago.  She is status post left lower extremity revascularization in the past. The patient is brought in for angiography for further evaluation and potential treatment.  Due to the limb threatening nature of the situation, angiogram was performed for attempted limb salvage. The patient is aware that if the procedure fails, amputation would be expected.  The patient also understands that even with successful revascularization, amputation may still be required due to the severity of the situation.  Risks and benefits are discussed and informed consent is obtained.   Procedure:  The patient was identified and appropriate procedural time out was performed.  The patient was then  placed supine on the table and prepped and draped in the usual sterile fashion. Moderate conscious sedation was administered during a face to face encounter with the patient throughout the procedure with my supervision of the RN administering medicines and monitoring the patient's vital signs, pulse oximetry, telemetry and mental status throughout from the start of the procedure until the patient was taken to the recovery room. Ultrasound was used to evaluate the right common femoral artery.  It was patent .  A digital ultrasound image was acquired.  A Seldinger needle was used to access the right common femoral artery under direct ultrasound guidance and a permanent image was performed.  A 0.035 J wire was advanced without resistance and a 5Fr sheath was placed.  Pigtail catheter was placed into the aorta and an AP aortogram was performed. This demonstrated patent renal arteries, aorta was patent without significant stenosis.  The right iliac artery had about a 30% stenosis proximally and the left iliac artery had about a 40% stenosis proximally.  No other iliac stenosis seen. I then crossed the aortic bifurcation and advanced to the left femoral head and then advanced into the proximal left superficial femoral artery for better distal opacification. Selective left lower extremity angiogram was then performed. This demonstrated diffuse disease throughout the previously placed left  SFA stent with occlusion of the distal portion of the stent and reconstitution of the proximal popliteal artery below the previously placed stent.  There was then two-vessel runoff distally with both the anterior tibial and posterior tibial arteries providing flow to the foot. It was felt that it was in the patient's best interest to proceed with intervention after these images to avoid a second procedure and a larger amount of contrast and fluoroscopy based off of the findings from the initial angiogram. The patient was systemically  heparinized and a 6 [place] Ansell sheath was then placed over the [employer] wire. I then used a Kumpe catheter and the advantage wire to cross the stenoses and occlusion in the SFA and confirm intraluminal flow in the popliteal artery.  The catheter was then removed and a 5 mm diameter by 22 cm length Lutonix drug-coated angioplasty balloon was inflated to 12 atm from the proximal SFA down to Hunter's canal.  This was held for 1 minute.  Completion imaging showed multiple areas of greater than 50% residual stenosis with the worst area being at the distal edge of the stent and just beyond the stent creating 70 to 80% stenosis.  I then exchanged for a 0.018 wire and placed a 6 mm diameter by 25 cm length Viabahn stent postdilated with a 6 mm balloon with excellent angiographic completion result and less than 10% residual stenosis. I elected to terminate the procedure. The sheath was removed and StarClose closure device was deployed in the right femoral artery with excellent hemostatic result. The patient was taken to the recovery room in stable condition having tolerated the procedure well.  Findings:               Aortogram:  patent renal arteries, aorta was patent without significant stenosis.  The right iliac artery had about a 30% stenosis proximally and the left iliac artery had about a 40% stenosis proximally.  No other iliac stenosis seen.             Left lower Extremity:  diffuse disease throughout the previously placed left SFA stent with occlusion of the distal portion of the stent and reconstitution of the proximal popliteal artery below the previously placed stent.  There was then two-vessel runoff distally with both the anterior tibial and posterior tibial arteries providing flow to the foot   Disposition: Patient was taken to the recovery room in stable condition having tolerated the procedure well.  Complications: None  Leotis Pain 10/30/2017 12:33 PM   This note was created with  Dragon Medical transcription system. Any errors in dictation are purely unintentional.

## 2017-10-31 ENCOUNTER — Encounter: Payer: Self-pay | Admitting: Vascular Surgery

## 2017-10-31 NOTE — Progress Notes (Signed)
[facility] - Clinic Note  11/01/2017     CHIEF COMPLAINT Patient presents for Retina Follow Up   HISTORY OF PRESENT ILLNESS: Cheryl Leonard is a 72 y.o. female who presents to the clinic today for:   HPI    Retina Follow Up    Patient presents with  CRVO/BRVO.  In right eye.  This started months ago.  Severity is moderate.  Duration of months.  Since onset it is stable.  I, the attending physician,  performed the HPI with the patient and updated documentation appropriately.          Comments    72 y/o female pt here for 8 wk f/u for CME/BRVO OD.  No change in [place] OU.  Denies pain, flashes, floaters.  AT prn OU.       Last edited by [name], MD on 11/04/2017 12:30 AM. (History)    pt states she has not noticed any change in her vision since last visit   Referring physician: Danella Penton, MD [address] [facility] [place], [place] [address]  HISTORICAL INFORMATION:   Selected notes from the MEDICAL RECORD NUMBER Referred by Dr. Senaida Ores for concern of DME OD;  LEE- 02.05.19 [BCVA OD: 20/70-1 OS: 20/30-1] Ocular Hx-  PMH- DM Lab Results  Component Value Date   HGBA1C 5.9 (H) 10/29/2015       CURRENT MEDICATIONS: No current outpatient medications on file. (Ophthalmic Drugs)   Current Facility-Administered Medications (Ophthalmic Drugs)  Medication Route  . aflibercept (EYLEA) SOLN 2 mg Intravitreal  . aflibercept (EYLEA) SOLN 2 mg Intravitreal  . aflibercept (EYLEA) SOLN 2 mg Intravitreal  . aflibercept (EYLEA) SOLN 2 mg Intravitreal   Current Outpatient Medications (Other)  Medication Sig  . ALPRAZolam (XANAX) 0.25 MG tablet Take 0.25 mg by mouth at bedtime as needed for sleep.   [name] amoxicillin (AMOXIL) 500 MG capsule BEGINNING MORNING OF PROCEDURE TAKE ONE CAPSULE 4 TIMES A DAY UNTIL FINISHED  . aspirin EC 81 MG tablet Take 81 mg by mouth daily.   [name] atorvastatin (LIPITOR) 20 MG tablet Take 20 mg  by mouth at bedtime.  [name] buPROPion (WELLBUTRIN XL) 150 MG 24 hr tablet Take 150 mg by mouth daily. Take with 300 mg to equal 450 mg once daily  . buPROPion (WELLBUTRIN XL) 150 MG 24 hr tablet Take by mouth.  [name] buPROPion (WELLBUTRIN XL) 300 MG 24 hr tablet Take 300 mg by mouth daily. Take with 150 mg to equal 450 mg once daily  . BYSTOLIC 5 MG tablet Take 5 mg by mouth daily.  . chlorhexidine (PERIDEX) 0.12 % solution RINSE FOR 30 SECONDS WITH 0.5 OZ TWICE A DAY  . cholecalciferol (VITAMIN D) 1000 units tablet Take 1,000 Units by mouth 2 (two) times daily.  [name] CINNAMON PO Take 1 tablet by mouth daily.  . clopidogrel (PLAVIX) 75 MG tablet Take 1 tablet (75 mg total) by mouth daily.  [name] CORAL CALCIUM PO Take 1 tablet by mouth daily.  [name] denosumab (PROLIA) 60 MG/ML SOLN injection Inject into the skin.  . DENTA 5000 PLUS 1.1 % CREA dental cream 2 (two) times daily. as directed  . DULoxetine (CYMBALTA) 60 MG capsule Take 60 mg by mouth 2 (two) times daily.   [name] gabapentin (NEURONTIN) 100 MG capsule TAKE 1 CAPSULE BY MOUTH THREE TIMES A DAY  . gemfibrozil (LOPID) 600 MG tablet Take 600 mg by mouth 2 (two) times daily.  [name]  Insulin Pen Needle (B-D UF III MINI PEN NEEDLES) 31G X 5 MM MISC once daily.  . Iron-Vitamin C (VITRON-C) 65-125 MG TABS Take 1 tablet by mouth daily.   [name] JANUMET 50-1000 MG per tablet Take 1 tablet by mouth 2 (two) times daily with a meal.  . levothyroxine (SYNTHROID, LEVOTHROID) 100 MCG tablet Take 100 mcg by mouth daily.  [name] lisinopril (PRINIVIL,ZESTRIL) 10 MG tablet Take 10 mg by mouth daily.   . Magnesium 500 MG TABS Take 500 mg by mouth daily.  . Multiple Vitamin (MULTIVITAMIN WITH MINERALS) TABS tablet Take 1 tablet by mouth daily.  . mupirocin ointment (BACTROBAN) 2 % APPLY TO AFFECTED AREA 3 TIMES A DAY  . Omega-3 Fatty Acids (FISH OIL) 1000 MG CAPS Take 1,000 mg by mouth daily.  [name] oxyCODONE-acetaminophen (PERCOCET/ROXICET) 5-325 MG tablet Take 1 tablet by mouth every 4 (four)  hours as needed.  . pantoprazole (PROTONIX) 40 MG tablet Take 40 mg by mouth daily.   . ranitidine (ZANTAC) 300 MG capsule Take 300 mg by mouth at bedtime.  . TRESIBA FLEXTOUCH 200 UNIT/ML SOPN INJECT 30 UNITS SUB-Q NIGHTLY  . vitamin B-12 (CYANOCOBALAMIN) 1000 MCG tablet Take 1,000 mcg by mouth daily.  . vitamin E 400 UNIT capsule Take 400 Units by mouth daily.  [name] zolpidem (AMBIEN) 10 MG tablet Take 10 mg by mouth at bedtime.   Current Facility-Administered Medications (Other)  Medication Route  . Bevacizumab (AVASTIN) SOLN 1.25 mg Intravitreal  . Bevacizumab (AVASTIN) SOLN 1.25 mg Intravitreal  . Bevacizumab (AVASTIN) SOLN 1.25 mg Intravitreal      REVIEW OF SYSTEMS: ROS    Positive for: Endocrine, Eyes, Psychiatric   Negative for: Constitutional, Gastrointestinal, Neurological, Skin, Genitourinary, Musculoskeletal, HENT, Cardiovascular, Respiratory, Allergic/Imm, Heme/Lymph   Last edited by Celine Mans, COA on 11/01/2017  2:34 PM. (History)       ALLERGIES No Known Allergies  PAST MEDICAL HISTORY Past Medical History:  Diagnosis Date  . Anxiety   . Arthritis   . Cataracts, both eyes   . GERD (gastroesophageal reflux disease)   . Gout   . History of fracture of patella    right knee  . History of positive PPD    Patient always shows positive  . Hyperlipidemia   . Hypertension   . Hypothyroidism   . Lichen sclerosus 12/30/2013   of vulva  . Metatarsal fracture   . Neuropathy   . Peripheral vascular disease (HCC)   . Polyneuropathy    numbness and tingling in feet and toes  . Renal insufficiency   . Type 2 diabetes mellitus, uncontrolled (HCC)    Past Surgical History:  Procedure Laterality Date  . APPENDECTOMY    . BREAST REDUCTION SURGERY  2001  . CATARACT EXTRACTION    . CESAREAN SECTION  1976  . COLONOSCOPY  03/05/2013   Nml - due for repeat 03/06/2018  . DILATION AND CURETTAGE OF UTERUS  1989  . ESOPHAGOGASTRODUODENOSCOPY  03/05/2013  . EYE SURGERY     . Eyelid Surgery  2012  . INTRAMEDULLARY (IM) NAIL INTERTROCHANTERIC Left 10/30/2015   Procedure: INTRAMEDULLARY (IM) NAIL INTERTROCHANTRIC ;  Surgeon: Kennedy Bucker, MD;  Location: [facility] ORS;  Service: Orthopedics;  Laterality: Left;  . LAPAROSCOPIC HYSTERECTOMY  2000   total  . LOWER EXTREMITY ANGIOGRAPHY Left 03/08/2017   Procedure: LOWER EXTREMITY ANGIOGRAPHY;  Surgeon: Annice Needy, MD;  Location: [facility] INVASIVE CV LAB;  Service: Cardiovascular;  Laterality: Left;  . LOWER EXTREMITY ANGIOGRAPHY Left  10/30/2017   Procedure: LOWER EXTREMITY ANGIOGRAPHY;  Surgeon: Annice Needy, MD;  Location: [facility] INVASIVE CV LAB;  Service: Cardiovascular;  Laterality: Left;  . REDUCTION MAMMAPLASTY  1997    FAMILY HISTORY Family History  Problem Relation Age of Onset  . Coronary artery disease Father   . Heart attack Father   . Coronary artery disease Mother   . Heart attack Mother   . Ovarian cancer Sister 18       sister had hormonal therapy for IVF txs-which increased risk factor for ovarian cancer  . Breast cancer Neg Hx     SOCIAL HISTORY Social History   Tobacco Use  . Smoking status: Former Smoker    Packs/day: 1.00    Years: 20.00    Pack years: 20.00    Types: Cigarettes    Last attempt to quit: 03/07/1996    Years since quitting: 21.6  . Smokeless tobacco: Never Used  . Tobacco comment: started smoking at age 50  Substance Use Topics  . Alcohol use: No    Alcohol/week: 0.0 standard drinks  . Drug use: No         OPHTHALMIC EXAM:  Base Eye Exam    Visual Acuity (Snellen - Linear)      Right Left   Dist [place] 20/50 20/20 -2   Dist ph [place] 20/50 +2    Correction:  Glasses       Tonometry (Tonopen, 2:52 PM)      Right Left   Pressure 13 12       Pupils      Dark Light Shape React APD   Right 3 2 Round Brisk None   Left 3 2 Round Brisk None       Visual Fields (Counting fingers)      Left Right    Full Full       Extraocular Movement      Right Left    Full,  Ortho Full, Ortho       Neuro/Psych    Oriented x3:  Yes   Mood/Affect:  Normal       Dilation    Both eyes:  1.0% Mydriacyl, 2.5% Phenylephrine @ 2:52 PM        Slit Lamp and Fundus Exam    External Exam      Right Left   External Normal Normal       Slit Lamp Exam      Right Left   Lids/Lashes dermatochalasis dermatochalasis   Conjunctiva/Sclera White and quiet White and quiet   Cornea arcus; well healed cataract wound; 2-3+ Punctate epithelial erosions, decreased TBUT, mild Anterior basement membrane dystrophy superiorly arcus; well healed cataract wound, 2+ Punctate epithelial erosions, irregualr epi surface, decreased TBUT   Anterior Chamber Deep and quiet Deep and quiet   Iris Round and dilated Round and dilated   Lens PCIOL; open PC PCIOL; open PC   Vitreous syneresis, Posterior vitreous detachment, floaters inferiorly, trace-1+ cell syneresis, Posterior vitreous detachment       Fundus Exam      Right Left   Disc +CWS with surrounding mild IRH superior disc -- increased Tilted disc   C/D Ratio 0.4 0.5   Macula Blunted foveal reflex, Cystic changes -- improved, scattered Microaneurysms --improved, Epiretinal membrane flat; no heme or edema   Vessels mildly tortuous; dilated superior arcade venules; mild CWS along sup temp arcade; AV crossing changes mild tortuosity; AV crossing changes   Periphery attached; 360 MAs, scattered  DBH peripherally attached          IMAGING AND PROCEDURES  Imaging and Procedures for 04/25/17  Intravitreal Injection, Pharmacologic Agent - OD - Right Eye       Time Out 11/01/2017. 3:26 PM. Confirmed correct patient, procedure, site, and patient consented.   Anesthesia Topical anesthesia was used. Anesthetic medications included Lidocaine 2%, Proparacaine 0.5%.   Procedure Preparation included 5% betadine to ocular surface, eyelid speculum. A 30 gauge needle was used.   Injection:  2 mg aflibercept 2 MG/0.05ML   NDC:  61755-005-02, Lot: [id number], Expiration date: 10/03/2018   Route: Intravitreal, Site: Right Eye, Waste: 0.05 mL  Post-op Post injection exam found visual acuity of at least counting fingers. The patient tolerated the procedure well. There were no complications. The patient received written and verbal post procedure care education.                 ASSESSMENT/PLAN:    ICD-10-CM   1. Branch retinal vein occlusion of right eye with macular edema H34.8310 Intravitreal Injection, Pharmacologic Agent - OD - Right Eye    aflibercept (EYLEA) SOLN 2 mg  2. Retinal edema H35.81   3. 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 Essential hypertension I10   5. Hypertensive retinopathy of both eyes H35.033   6. Epiretinal membrane (ERM) of right eye H35.371   7. Pseudophakia of both eyes Z96.1   8. 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 CME OD -- likely remote BRVO OD - by history, pt states symptoms first noticed 2 wks prior to presentation, but reports changes may have occurred prior - initial exam with differential tortuosity of vessels (OD > OS) - FA on 2.8.19 (initial visit) with petaloid leakage and hyperfluorescence of the disc - differential includes DM2 (DME), hypertensive retinopathy, inflammatory etiology / uveitis - S/P IVA OD #1 (02.08.19), #2 (03.11.19), #3 (04.09.19), #4 (05.20.19) -- no significant improvement - review of OCTs show interval increase in CME OD after initial improvement post IVA #1 -- possible resistance to IVA  - June 2019 -- switched therapies: S/P IVE OD #1 (06.24.19), #2 (07.24.19), #3 (09.04.19) - repeat FA on 07.24.19 showed interval improvement in CME/ leakage OD - CME has remained resolved and today doing well at 8 wk interval - unable to obtain OCT today due to [place] malfunction - BCVA 20/50 OD today - recommend IVE OD #4 (10.30.19) -  RBA of procedure discussed, questions answered - informed consent obtained and signed - see procedure note - benefits investigation paperwork for Eylea signed on 06.24.19 - approved for Eylea through [employer] as of 06.26.19 - F/U 8 weeks -- DFE/OCT  3. Mild nonproliferative diabetic retinopathy, both eyes - The incidence, risk factors for progression, natural history and treatment options for diabetic retinopathy were discussed with patient.   - The need for close monitoring of blood glucose, blood pressure, and serum lipids, avoiding cigarette or any type of tobacco, and the need for long term follow up was also discussed with patient. - could have contributed to CME OD - OS with minimal diabetic retinopathy - continue to monitor  4,5. Hypertensive retinopathy OU - stable - as above, may have contributed to CME OD - discussed importance of tight BP control - monitor  6. Epiretinal membrane, right eye  The natural history, anatomy, potential for loss of vision, and treatment  options including vitrectomy techniques and the complications of endophthalmitis, retinal detachment, vitreous hemorrhage, cataract progression and permanent vision loss discussed with the patient. - stable - no indication for surgery at this time  7. Pseudophakia OU - s/p CE/IOL OU by cataract surgeon in [facility] - doing well - monitor  8. Optic disc edema OD -- sectoral - likely secondary to BRVO but differential includes carotid stenosis and retro-orbital mass - history of blood clots - recommend CT orbits w/ contrast to r/o retro-orbital mass -- not obtained - recommend carotid dopplers to r/o stenosis / occlusion -- normal study   Ophthalmic Meds Ordered this visit:  Meds ordered this encounter  Medications  . aflibercept (EYLEA) SOLN 2 mg       Return in about 8 weeks (around 12/25/2017) for F/U, CME OD.  There are no Patient Instructions on file for this visit.   Explained the diagnoses, plan,  and follow up with the patient and they expressed understanding.  Patient expressed understanding of the importance of proper follow up care.     Karie Chimera, M.D., Ph.D. Diseases & Surgery of the Retina and Vitreous [facility]   I have reviewed the above documentation for accuracy and completeness, and I agree with the above. Karie Chimera, M.D., Ph.D. 11/04/17 12:32 AM    Abbreviations: M myopia (nearsighted); A astigmatism; H hyperopia (farsighted); P presbyopia; Mrx spectacle prescription;  CTL contact lenses; OD right eye; OS left eye; OU both eyes  XT exotropia; ET esotropia; PEK punctate epithelial keratitis; PEE punctate epithelial erosions; DES dry eye syndrome; MGD meibomian gland dysfunction; ATs artificial tears; PFAT's preservative free artificial tears; NSC nuclear sclerotic cataract; PSC posterior subcapsular cataract; ERM epi-retinal membrane; PVD posterior vitreous detachment; RD retinal detachment; DM diabetes mellitus; DR diabetic retinopathy; NPDR non-proliferative diabetic retinopathy; PDR proliferative diabetic retinopathy; CSME clinically significant macular edema; DME diabetic macular edema; dbh dot blot hemorrhages; CWS cotton wool spot; POAG primary open angle glaucoma; C/D cup-to-disc ratio; HVF humphrey visual field; GVF goldmann visual field; OCT optical coherence tomography; IOP intraocular pressure; BRVO Branch retinal vein occlusion; CRVO central retinal vein occlusion; CRAO central retinal artery occlusion; BRAO branch retinal artery occlusion; RT retinal tear; SB scleral buckle; PPV pars plana vitrectomy; VH Vitreous hemorrhage; PRP panretinal laser photocoagulation; IVK intravitreal kenalog; VMT vitreomacular traction; MH Macular hole;  NVD neovascularization of the disc; NVE neovascularization elsewhere; AREDS age related eye disease study; ARMD age related macular degeneration; POAG primary open angle glaucoma; EBMD epithelial/anterior  basement membrane dystrophy; ACIOL anterior chamber intraocular lens; IOL intraocular lens; PCIOL posterior chamber intraocular lens; Phaco/IOL phacoemulsification with intraocular lens placement; PRK photorefractive keratectomy; LASIK laser assisted in situ keratomileusis; HTN hypertension; DM diabetes mellitus; COPD chronic obstructive pulmonary disease

## 2017-11-01 ENCOUNTER — Encounter (INDEPENDENT_AMBULATORY_CARE_PROVIDER_SITE_OTHER): Payer: Self-pay | Admitting: Ophthalmology

## 2017-11-01 ENCOUNTER — Ambulatory Visit (INDEPENDENT_AMBULATORY_CARE_PROVIDER_SITE_OTHER): Payer: Medicare Other | Admitting: Ophthalmology

## 2017-11-01 DIAGNOSIS — E113213 Type 2 diabetes mellitus with mild nonproliferative diabetic retinopathy with macular edema, bilateral: Secondary | ICD-10-CM | POA: Diagnosis not present

## 2017-11-01 DIAGNOSIS — I10 Essential (primary) hypertension: Secondary | ICD-10-CM

## 2017-11-01 DIAGNOSIS — H4710 Unspecified papilledema: Secondary | ICD-10-CM

## 2017-11-01 DIAGNOSIS — H35371 Puckering of macula, right eye: Secondary | ICD-10-CM

## 2017-11-01 DIAGNOSIS — H35033 Hypertensive retinopathy, bilateral: Secondary | ICD-10-CM

## 2017-11-01 DIAGNOSIS — H3581 Retinal edema: Secondary | ICD-10-CM | POA: Diagnosis not present

## 2017-11-01 DIAGNOSIS — H34831 Tributary (branch) retinal vein occlusion, right eye, with macular edema: Secondary | ICD-10-CM | POA: Diagnosis not present

## 2017-11-01 DIAGNOSIS — Z961 Presence of intraocular lens: Secondary | ICD-10-CM

## 2017-11-01 MED ORDER — AFLIBERCEPT 2MG/0.05ML IZ SOLN FOR KALEIDOSCOPE
2.0000 mg | INTRAVITREAL | Status: DC
  Administered 2017-11-01: 2 mg via INTRAVITREAL

## 2017-11-04 ENCOUNTER — Encounter (INDEPENDENT_AMBULATORY_CARE_PROVIDER_SITE_OTHER): Payer: Self-pay | Admitting: Ophthalmology

## 2017-11-28 ENCOUNTER — Encounter (INDEPENDENT_AMBULATORY_CARE_PROVIDER_SITE_OTHER): Payer: Self-pay | Admitting: Nurse Practitioner

## 2017-11-28 ENCOUNTER — Ambulatory Visit (INDEPENDENT_AMBULATORY_CARE_PROVIDER_SITE_OTHER): Payer: Medicare Other | Admitting: Nurse Practitioner

## 2017-11-28 ENCOUNTER — Other Ambulatory Visit (INDEPENDENT_AMBULATORY_CARE_PROVIDER_SITE_OTHER): Payer: Self-pay | Admitting: Vascular Surgery

## 2017-11-28 ENCOUNTER — Ambulatory Visit (INDEPENDENT_AMBULATORY_CARE_PROVIDER_SITE_OTHER): Payer: Medicare Other

## 2017-11-28 VITALS — BP 143/82 | HR 82 | Resp 16 | Ht 66.0 in | Wt 163.2 lb

## 2017-11-28 DIAGNOSIS — I70249 Atherosclerosis of native arteries of left leg with ulceration of unspecified site: Secondary | ICD-10-CM | POA: Diagnosis not present

## 2017-11-28 DIAGNOSIS — I739 Peripheral vascular disease, unspecified: Secondary | ICD-10-CM | POA: Diagnosis not present

## 2017-11-28 DIAGNOSIS — Z87891 Personal history of nicotine dependence: Secondary | ICD-10-CM

## 2017-11-28 DIAGNOSIS — Z95820 Peripheral vascular angioplasty status with implants and grafts: Secondary | ICD-10-CM

## 2017-11-28 DIAGNOSIS — E1165 Type 2 diabetes mellitus with hyperglycemia: Secondary | ICD-10-CM

## 2017-11-28 DIAGNOSIS — E785 Hyperlipidemia, unspecified: Secondary | ICD-10-CM

## 2017-11-29 ENCOUNTER — Encounter (INDEPENDENT_AMBULATORY_CARE_PROVIDER_SITE_OTHER): Payer: Self-pay | Admitting: Nurse Practitioner

## 2017-11-29 NOTE — Progress Notes (Signed)
Subjective:    Patient ID: Cheryl PurserVeronica S Inman, female    DOB: 09/23/1945, 72 y.o.   MRN: [id number] Chief Complaint  Patient presents with  . Follow-up    33month abi    HPI  Cheryl Leonard is a 72 y.o. female that returns to the office for followup and review of the noninvasive studies. There have been no interval changes in lower extremity symptoms. No interval shortening of the patient's claudication distance or development of rest pain symptoms. No new ulcers or wounds have occurred since the last visit.  Patient states that her current ulceration on her left toe has been healing well since her angiogram on 10/30/2017.  There have been no significant changes to the patient's overall health care.  The patient denies amaurosis fugax or recent TIA symptoms. There are no recent neurological changes noted. The patient denies history of DVT, PE or superficial thrombophlebitis. The patient denies recent episodes of angina or shortness of breath.   ABI Rt=0.71 and Lt=1.17  (previous ABI's Rt=0.69 and Lt=0.61) The tibial waveforms of the right lower extremity are biphasic with dampened toe waveform.  The left tibial arteries are triphasic with strong toe waveforms.  Past Medical History:  Diagnosis Date  . Anxiety   . Arthritis   . Cataracts, both eyes   . GERD (gastroesophageal reflux disease)   . Gout   . History of fracture of patella    right knee  . History of positive PPD    Patient always shows positive  . Hyperlipidemia   . Hypertension   . Hypothyroidism   . Lichen sclerosus 12/30/2013   of vulva  . Metatarsal fracture   . Neuropathy   . Peripheral vascular disease (HCC)   . Polyneuropathy    numbness and tingling in feet and toes  . Renal insufficiency   . Type 2 diabetes mellitus, uncontrolled (HCC)     Past Surgical History:  Procedure Laterality Date  . APPENDECTOMY    . BREAST REDUCTION SURGERY  2001  . CATARACT EXTRACTION    . CESAREAN SECTION  1976   . COLONOSCOPY  03/05/2013   Nml - due for repeat 03/06/2018  . DILATION AND CURETTAGE OF UTERUS  1989  . ESOPHAGOGASTRODUODENOSCOPY  03/05/2013  . EYE SURGERY    . Eyelid Surgery  2012  . INTRAMEDULLARY (IM) NAIL INTERTROCHANTERIC Left 10/30/2015   Procedure: INTRAMEDULLARY (IM) NAIL INTERTROCHANTRIC ;  Surgeon: Kennedy BuckerMichael Menz, MD;  Location: [facility];  Service: Orthopedics;  Laterality: Left;  . LAPAROSCOPIC HYSTERECTOMY  2000   total  . LOWER EXTREMITY ANGIOGRAPHY Left 03/08/2017   Procedure: LOWER EXTREMITY ANGIOGRAPHY;  Surgeon: Annice Needyew, Jason S, MD;  Location: [facility];  Service: Cardiovascular;  Laterality: Left;  . LOWER EXTREMITY ANGIOGRAPHY Left 10/30/2017   Procedure: LOWER EXTREMITY ANGIOGRAPHY;  Surgeon: Annice Needyew, Jason S, MD;  Location: [facility];  Service: Cardiovascular;  Laterality: Left;  . REDUCTION MAMMAPLASTY  1997    Social History   Socioeconomic History  . Marital status: Married    Spouse name: Not on file  . Number of children: Not on file  . Years of education: Not on file  . Highest education level: Not on file  Occupational History  . Not on file  Social Needs  . Financial resource strain: Not on file  . Food insecurity:    Worry: Not on file    Inability: Not on file  . Transportation needs:    Medical: Not  on file    Non-medical: Not on file  Tobacco Use  . Smoking status: Former Smoker    Packs/day: 1.00    Years: 20.00    Pack years: 20.00    Types: Cigarettes    Last attempt to quit: 03/07/1996    Years since quitting: 21.7  . Smokeless tobacco: Never Used  . Tobacco comment: started smoking at age 9  Substance and Sexual Activity  . Alcohol use: No    Alcohol/week: 0.0 standard drinks  . Drug use: No  . Sexual activity: Yes    Partners: Male    Birth control/protection: Surgical  Lifestyle  . Physical activity:    Days per week: Not on file    Minutes per session: Not on file  . Stress: Not on file  Relationships  .  Social connections:    Talks on phone: Not on file    Gets together: Not on file    Attends religious service: Not on file    Active member of club or organization: Not on file    Attends meetings of clubs or organizations: Not on file    Relationship status: Not on file  . Intimate partner violence:    Fear of current or ex partner: Not on file    Emotionally abused: Not on file    Physically abused: Not on file    Forced sexual activity: Not on file  Other Topics Concern  . Not on file  Social History Narrative  . Not on file    Family History  Problem Relation Age of Onset  . Coronary artery disease Father   . Heart attack Father   . Coronary artery disease Mother   . Heart attack Mother   . Ovarian cancer Sister 74       sister had hormonal therapy for IVF txs-which increased risk factor for ovarian cancer  . Breast cancer Neg Hx     No Known Allergies   Review of Systems   Review of Systems: Negative Unless Checked Constitutional: [] Weight loss  [] Fever  [] Chills Cardiac: [] Chest pain   []  Atrial Fibrillation  [] Palpitations   [] Shortness of breath when laying flat   [] Shortness of breath with exertion. Vascular:  [] Pain in legs with walking   [] Pain in legs with standing  [] History of DVT   [] Phlebitis   [] Swelling in legs   [] Varicose veins   [x] Non-healing ulcers Pulmonary:   [] Uses home oxygen   [] Productive cough   [] Hemoptysis   [] Wheeze  [] COPD   [] Asthma Neurologic:  [] Dizziness   [] Seizures   [] History of stroke   [] History of TIA  [] Aphasia   [] Vissual changes   [] Weakness or numbness in arm   [] Weakness or numbness in leg Musculoskeletal:   [] Joint swelling   [x] Joint pain   [] Low back pain  []  History of Knee Replacement Hematologic:  [] Easy bruising  [] Easy bleeding   [] Hypercoagulable state   [] Anemic Gastrointestinal:  [] Diarrhea   [] Vomiting  [] Gastroesophageal reflux/heartburn   [] Difficulty swallowing. Genitourinary:  [] Chronic kidney disease    [] Difficult urination  [] Anuric   [] Blood in urine Skin:  [] Rashes   [x] Ulcers  Psychological:  [] History of anxiety   []  History of major depression  []  Memory Difficulties     Objective:   Physical Exam  BP (!) 143/82 (BP Location: Right Arm)   Pulse 82   Resp 16   Ht 5\' 6"  (1.676 m)   Wt 163 lb 3.2 oz (74  kg)   BMI 26.34 kg/m   Gen: WD/WN, NAD Head: [place]/AT, No temporalis wasting.  Ear/Nose/Throat: Hearing grossly intact, nares w/o erythema or drainage Eyes: PER, EOMI, sclera nonicteric.  Neck: Supple, no masses.  No JVD.  Pulmonary:  Good air movement, no use of accessory muscles.  Cardiac: RRR Vascular:  Vessel Right Left  Radial Palpable Palpable  Dorsalis Pedis Palpable Palpable  Posterior Tibial Palpable Palpable   Gastrointestinal: soft, non-distended. No guarding/no peritoneal signs.  Musculoskeletal: M/S 5/5 throughout.  No deformity or atrophy.  Neurologic: Pain and light touch intact in extremities.  Symmetrical.  Speech is fluent. Motor exam as listed above. Psychiatric: Judgment intact, Mood & affect appropriate for pt's clinical situation. Dermatologic: No Venous rashes.  No changes consistent with cellulitis. Ulcer, left great toe, currently dressed Lymph : No Cervical lymphadenopathy, no lichenification or skin changes of chronic lymphedema.      Assessment & Plan:   1. PAD (peripheral artery disease) (HCC) ABI Rt=0.71 and Lt=1.17  (previous ABI's Rt=0.69 and Lt=0.61) The tibial waveforms of the right lower extremity are biphasic with dampened toe waveform.  The left tibial arteries are triphasic with strong toe waveforms.  Recommend:  The patient is status post successful angiogram with intervention.  The patient reports that the claudication symptoms and leg pain is essentially gone.   The patient denies lifestyle limiting changes at this point in time.The toe ulcer is healing well per the patient.   No further invasive studies, angiography or surgery  at this time The patient should continue walking and begin a more formal exercise program.  The patient should continue antiplatelet therapy and aggressive treatment of the lipid abnormalities  The patient should continue wearing graduated compression socks 10-15 mmHg strength to control the mild edema.  Patient should undergo noninvasive studies as ordered. The patient will follow up with me after the studies.   - VAS [place] ABI WITH/WO TBI; Future  2. Uncontrolled type 2 diabetes mellitus with hyperglycemia (HCC) Continue hypoglycemic medications as already ordered, these medications have been reviewed and there are no changes at this time.  Hgb A1C to be monitored as already arranged by primary service   3. Hyperlipidemia, unspecified hyperlipidemia type Continue statin as ordered and reviewed, no changes at this time     Current Outpatient Medications on File Prior to Visit  Medication Sig Dispense Refill  . ALPRAZolam (XANAX) 0.25 MG tablet Take 0.25 mg by mouth at bedtime as needed for sleep.     [name] amoxicillin (AMOXIL) 500 MG capsule BEGINNING MORNING OF PROCEDURE TAKE ONE CAPSULE 4 TIMES A DAY UNTIL FINISHED  0  . aspirin EC 81 MG tablet Take 81 mg by mouth daily.     [name] atorvastatin (LIPITOR) 20 MG tablet Take 20 mg by mouth at bedtime.  3  . buPROPion (WELLBUTRIN XL) 150 MG 24 hr tablet Take 150 mg by mouth daily. Take with 300 mg to equal 450 mg once daily    . buPROPion (WELLBUTRIN XL) 150 MG 24 hr tablet Take by mouth.    [name] buPROPion (WELLBUTRIN XL) 300 MG 24 hr tablet Take 300 mg by mouth daily. Take with 150 mg to equal 450 mg once daily  11  . BYSTOLIC 5 MG tablet Take 5 mg by mouth daily.  2  . chlorhexidine (PERIDEX) 0.12 % solution RINSE FOR 30 SECONDS WITH 0.5 OZ TWICE A DAY  0  . cholecalciferol (VITAMIN D) 1000 units tablet Take 1,000 Units by mouth  2 (two) times daily.    [name] CINNAMON PO Take 1 tablet by mouth daily.    . clopidogrel (PLAVIX) 75 MG tablet Take 1  tablet (75 mg total) by mouth daily. 30 tablet 11  . CORAL CALCIUM PO Take 1 tablet by mouth daily.    [name] denosumab (PROLIA) 60 MG/ML SOLN injection Inject into the skin.    . DENTA 5000 PLUS 1.1 % CREA dental cream 2 (two) times daily. as directed  99  . DULoxetine (CYMBALTA) 60 MG capsule Take 60 mg by mouth 2 (two) times daily.     [name] gabapentin (NEURONTIN) 100 MG capsule TAKE 1 CAPSULE BY MOUTH THREE TIMES A DAY  0  . gemfibrozil (LOPID) 600 MG tablet Take 600 mg by mouth 2 (two) times daily.  3  . Insulin Pen Needle (B-D UF III MINI PEN NEEDLES) 31G X 5 MM MISC once daily.    . Iron-Vitamin C (VITRON-C) 65-125 MG TABS Take 1 tablet by mouth daily.     [name] JANUMET 50-1000 MG per tablet Take 1 tablet by mouth 2 (two) times daily with a meal.  3  . levothyroxine (SYNTHROID, LEVOTHROID) 100 MCG tablet Take 100 mcg by mouth daily.  3  . lisinopril (PRINIVIL,ZESTRIL) 10 MG tablet Take 10 mg by mouth daily.     . Magnesium 500 MG TABS Take 500 mg by mouth daily.    . Multiple Vitamin (MULTIVITAMIN WITH MINERALS) TABS tablet Take 1 tablet by mouth daily.    . mupirocin ointment (BACTROBAN) 2 % APPLY TO AFFECTED AREA 3 TIMES A DAY  0  . Omega-3 Fatty Acids (FISH OIL) 1000 MG CAPS Take 1,000 mg by mouth daily.    [name] oxyCODONE-acetaminophen (PERCOCET/ROXICET) 5-325 MG tablet Take 1 tablet by mouth every 4 (four) hours as needed.  0  . pantoprazole (PROTONIX) 40 MG tablet Take 40 mg by mouth daily.     . ranitidine (ZANTAC) 300 MG capsule Take 300 mg by mouth at bedtime.  1  . TRESIBA FLEXTOUCH 200 UNIT/ML SOPN INJECT 30 UNITS SUB-Q NIGHTLY  5  . vitamin B-12 (CYANOCOBALAMIN) 1000 MCG tablet Take 1,000 mcg by mouth daily.    . vitamin E 400 UNIT capsule Take 400 Units by mouth daily.    [name] zolpidem (AMBIEN) 10 MG tablet Take 10 mg by mouth at bedtime.  5   Current Facility-Administered Medications on File Prior to Visit  Medication Dose Route Frequency Provider Last Rate Last Dose  . aflibercept (EYLEA)  SOLN 2 mg  2 mg Intravitreal  [name], MD   2 mg at 06/27/17 1307  . aflibercept (EYLEA) SOLN 2 mg  2 mg Intravitreal  [name], MD   2 mg at 07/26/17 1702  . aflibercept (EYLEA) SOLN 2 mg  2 mg Intravitreal  [name], MD   2 mg at 09/06/17 1700  . aflibercept (EYLEA) SOLN 2 mg  2 mg Intravitreal  [name], MD   2 mg at 11/01/17 1550  . Bevacizumab (AVASTIN) SOLN 1.25 mg  1.25 mg Intravitreal  [name], MD   1.25 mg at 02/10/17 0953  . Bevacizumab (AVASTIN) SOLN 1.25 mg  1.25 mg Intravitreal  [name], MD   1.25 mg at 03/13/17 1717  . Bevacizumab (AVASTIN) SOLN 1.25 mg  1.25 mg Intravitreal  [name], MD   1.25 mg at 04/11/17 1626    There are no Patient Instructions on file for this visit. No follow-ups on file.  Kris Hartmann, NP  This note was completed with Sales executive.  Any errors are purely unintentional.

## 2018-01-01 ENCOUNTER — Encounter (INDEPENDENT_AMBULATORY_CARE_PROVIDER_SITE_OTHER): Payer: Self-pay | Admitting: Ophthalmology

## 2018-01-01 ENCOUNTER — Ambulatory Visit (INDEPENDENT_AMBULATORY_CARE_PROVIDER_SITE_OTHER): Payer: Medicare Other | Admitting: Ophthalmology

## 2018-01-01 DIAGNOSIS — I10 Essential (primary) hypertension: Secondary | ICD-10-CM

## 2018-01-01 DIAGNOSIS — E113213 Type 2 diabetes mellitus with mild nonproliferative diabetic retinopathy with macular edema, bilateral: Secondary | ICD-10-CM | POA: Diagnosis not present

## 2018-01-01 DIAGNOSIS — H34831 Tributary (branch) retinal vein occlusion, right eye, with macular edema: Secondary | ICD-10-CM

## 2018-01-01 DIAGNOSIS — Z961 Presence of intraocular lens: Secondary | ICD-10-CM

## 2018-01-01 DIAGNOSIS — H4710 Unspecified papilledema: Secondary | ICD-10-CM

## 2018-01-01 DIAGNOSIS — H35033 Hypertensive retinopathy, bilateral: Secondary | ICD-10-CM

## 2018-01-01 DIAGNOSIS — H35371 Puckering of macula, right eye: Secondary | ICD-10-CM

## 2018-01-01 DIAGNOSIS — H3581 Retinal edema: Secondary | ICD-10-CM

## 2018-01-01 NOTE — Progress Notes (Signed)
[facility] - Clinic Note  01/01/2018     CHIEF COMPLAINT Patient presents for Retina Follow Up   HISTORY OF PRESENT ILLNESS: Cheryl Leonard is a 72 y.o. female who presents to the clinic today for:   HPI    Retina Follow Up    Patient presents with  CRVO/BRVO.  In right eye.  Severity is moderate.  Duration of 8 weeks.  I, the attending physician,  performed the HPI with the patient and updated documentation appropriately.          Comments    Patient states vision the same OU. BS was 114 this am. Last A1c was 5.4 around 2 months ago.        Last edited by [name], MD on 01/01/2018  3:47 PM. (History)    pt has no complaints about her vision   Referring physician: Danella Penton, MD [address] [facility] [address] [facility] [place], [place] [address]  HISTORICAL INFORMATION:   Selected notes from the MEDICAL RECORD NUMBER Referred by Dr. Senaida Ores for concern of DME OD;  LEE- 02.05.19 [BCVA OD: 20/70-1 OS: 20/30-1] Ocular Hx-  PMH- DM Lab Results  Component Value Date   HGBA1C 5.9 (H) 10/29/2015       CURRENT MEDICATIONS: No current outpatient medications on file. (Ophthalmic Drugs)   Current Facility-Administered Medications (Ophthalmic Drugs)  Medication Route  . aflibercept (EYLEA) SOLN 2 mg Intravitreal  . aflibercept (EYLEA) SOLN 2 mg Intravitreal  . aflibercept (EYLEA) SOLN 2 mg Intravitreal  . aflibercept (EYLEA) SOLN 2 mg Intravitreal   Current Outpatient Medications (Other)  Medication Sig  . ALPRAZolam (XANAX) 0.25 MG tablet Take 0.25 mg by mouth at bedtime as needed for sleep.   [name] amoxicillin (AMOXIL) 500 MG capsule BEGINNING MORNING OF PROCEDURE TAKE ONE CAPSULE 4 TIMES A DAY UNTIL FINISHED  . aspirin EC 81 MG tablet Take 81 mg by mouth daily.   [name] atorvastatin (LIPITOR) 20 MG tablet Take 20 mg by mouth at bedtime.  [name] buPROPion (WELLBUTRIN XL) 150 MG 24 hr tablet Take 150 mg by mouth daily. Take with  300 mg to equal 450 mg once daily  . buPROPion (WELLBUTRIN XL) 150 MG 24 hr tablet Take by mouth.  [name] buPROPion (WELLBUTRIN XL) 300 MG 24 hr tablet Take 300 mg by mouth daily. Take with 150 mg to equal 450 mg once daily  . BYSTOLIC 5 MG tablet Take 5 mg by mouth daily.  . chlorhexidine (PERIDEX) 0.12 % solution RINSE FOR 30 SECONDS WITH 0.5 OZ TWICE A DAY  . cholecalciferol (VITAMIN D) 1000 units tablet Take 1,000 Units by mouth 2 (two) times daily.  [name] CINNAMON PO Take 1 tablet by mouth daily.  . clopidogrel (PLAVIX) 75 MG tablet Take 1 tablet (75 mg total) by mouth daily.  [name] CORAL CALCIUM PO Take 1 tablet by mouth daily.  [name] denosumab (PROLIA) 60 MG/ML SOLN injection Inject into the skin.  . DENTA 5000 PLUS 1.1 % CREA dental cream 2 (two) times daily. as directed  . DULoxetine (CYMBALTA) 60 MG capsule Take 60 mg by mouth 2 (two) times daily.   [name] gabapentin (NEURONTIN) 100 MG capsule TAKE 1 CAPSULE BY MOUTH THREE TIMES A DAY  . gemfibrozil (LOPID) 600 MG tablet Take 600 mg by mouth 2 (two) times daily.  . Insulin Pen Needle (B-D UF III MINI PEN NEEDLES) 31G X 5 MM MISC once daily.  . Iron-Vitamin C (VITRON-C) 65-125  MG TABS Take 1 tablet by mouth daily.   [name] JANUMET 50-1000 MG per tablet Take 1 tablet by mouth 2 (two) times daily with a meal.  . levothyroxine (SYNTHROID, LEVOTHROID) 100 MCG tablet Take 100 mcg by mouth daily.  [name] lisinopril (PRINIVIL,ZESTRIL) 10 MG tablet Take 10 mg by mouth daily.   . Magnesium 500 MG TABS Take 500 mg by mouth daily.  . Multiple Vitamin (MULTIVITAMIN WITH MINERALS) TABS tablet Take 1 tablet by mouth daily.  . mupirocin ointment (BACTROBAN) 2 % APPLY TO AFFECTED AREA 3 TIMES A DAY  . Omega-3 Fatty Acids (FISH OIL) 1000 MG CAPS Take 1,000 mg by mouth daily.  [name] oxyCODONE-acetaminophen (PERCOCET/ROXICET) 5-325 MG tablet Take 1 tablet by mouth every 4 (four) hours as needed.  . pantoprazole (PROTONIX) 40 MG tablet Take 40 mg by mouth daily.   . ranitidine (ZANTAC)  300 MG capsule Take 300 mg by mouth at bedtime.  . TRESIBA FLEXTOUCH 200 UNIT/ML SOPN INJECT 30 UNITS SUB-Q NIGHTLY  . vitamin B-12 (CYANOCOBALAMIN) 1000 MCG tablet Take 1,000 mcg by mouth daily.  . vitamin E 400 UNIT capsule Take 400 Units by mouth daily.  [name] zolpidem (AMBIEN) 10 MG tablet Take 10 mg by mouth at bedtime.   Current Facility-Administered Medications (Other)  Medication Route  . Bevacizumab (AVASTIN) SOLN 1.25 mg Intravitreal  . Bevacizumab (AVASTIN) SOLN 1.25 mg Intravitreal  . Bevacizumab (AVASTIN) SOLN 1.25 mg Intravitreal      REVIEW OF SYSTEMS: ROS    Positive for: Endocrine, Eyes, Psychiatric   Negative for: Constitutional, Gastrointestinal, Neurological, Skin, Genitourinary, Musculoskeletal, HENT, Cardiovascular, Respiratory, Allergic/Imm, Heme/Lymph   Last edited by Annalee Genta D on 01/01/2018  2:16 PM. (History)       ALLERGIES No Known Allergies  PAST MEDICAL HISTORY Past Medical History:  Diagnosis Date  . Anxiety   . Arthritis   . Cataracts, both eyes   . GERD (gastroesophageal reflux disease)   . Gout   . History of fracture of patella    right knee  . History of positive PPD    Patient always shows positive  . Hyperlipidemia   . Hypertension   . Hypothyroidism   . Lichen sclerosus 12/30/2013   of vulva  . Metatarsal fracture   . Neuropathy   . Peripheral vascular disease (HCC)   . Polyneuropathy    numbness and tingling in feet and toes  . Renal insufficiency   . Type 2 diabetes mellitus, uncontrolled (HCC)    Past Surgical History:  Procedure Laterality Date  . APPENDECTOMY    . BREAST REDUCTION SURGERY  2001  . CATARACT EXTRACTION    . CESAREAN SECTION  1976  . COLONOSCOPY  03/05/2013   Nml - due for repeat 03/06/2018  . DILATION AND CURETTAGE OF UTERUS  1989  . ESOPHAGOGASTRODUODENOSCOPY  03/05/2013  . EYE SURGERY    . Eyelid Surgery  2012  . INTRAMEDULLARY (IM) NAIL INTERTROCHANTERIC Left 10/30/2015   Procedure:  INTRAMEDULLARY (IM) NAIL INTERTROCHANTRIC ;  Surgeon: Kennedy Bucker, MD;  Location: [facility] ORS;  Service: Orthopedics;  Laterality: Left;  . LAPAROSCOPIC HYSTERECTOMY  2000   total  . LOWER EXTREMITY ANGIOGRAPHY Left 03/08/2017   Procedure: LOWER EXTREMITY ANGIOGRAPHY;  Surgeon: Annice Needy, MD;  Location: [facility] INVASIVE CV LAB;  Service: Cardiovascular;  Laterality: Left;  . LOWER EXTREMITY ANGIOGRAPHY Left 10/30/2017   Procedure: LOWER EXTREMITY ANGIOGRAPHY;  Surgeon: Annice Needy, MD;  Location: [facility] INVASIVE CV LAB;  Service: Cardiovascular;  Laterality: Left;  . REDUCTION MAMMAPLASTY  1997    FAMILY HISTORY Family History  Problem Relation Age of Onset  . Coronary artery disease Father   . Heart attack Father   . Coronary artery disease Mother   . Heart attack Mother   . Ovarian cancer Sister [id number]       sister had hormonal therapy for IVF txs-which increased risk factor for ovarian cancer  . Breast cancer Neg Hx     SOCIAL HISTORY Social History   Tobacco Use  . Smoking status: Former Smoker    Packs/day: 1.00    Years: 20.00    Pack years: 20.00    Types: Cigarettes    Last attempt to quit: 03/07/1996    Years since quitting: 21.8  . Smokeless tobacco: Never Used  . Tobacco comment: started smoking at age 72  Substance Use Topics  . Alcohol use: No    Alcohol/week: 0.0 standard drinks  . Drug use: No         OPHTHALMIC EXAM:  Base Eye Exam    Visual Acuity (Snellen - Linear)      Right Left   Dist [place] 20/40 20/30   Dist ph [place] 20/40 +1 20/25 +1       Tonometry (Tonopen, 2:27 PM)      Right Left   Pressure 15 17       Pupils      Dark Light Shape React APD   Right 3 2 Round Brisk None   Left 3 2 Round Brisk None       Visual Fields (Counting fingers)      Left Right    Full Full       Extraocular Movement      Right Left    Full, Ortho Full, Ortho       Neuro/Psych    Oriented x3:  Yes   Mood/Affect:  Normal       Dilation    Both eyes:  1.0%  Mydriacyl, 2.5% Phenylephrine @ 2:27 PM        Slit Lamp and Fundus Exam    External Exam      Right Left   External Normal Normal       Slit Lamp Exam      Right Left   Lids/Lashes dermatochalasis dermatochalasis   Conjunctiva/Sclera White and quiet White and quiet   Cornea arcus; well healed cataract wound; 1+ Punctate epithelial erosions, decreased TBUT, mild Anterior basement membrane dystrophy superiorly arcus; well healed cataract wound, 1+ Punctate epithelial erosions, irregualr epi surface, decreased TBUT   Anterior Chamber Deep and quiet Deep and quiet   Iris Round and dilated Round and dilated   Lens PCIOL; open PC PCIOL; open PC   Vitreous syneresis, Posterior vitreous detachment, floaters inferiorly, trace-1+ cell syneresis, Posterior vitreous detachment       Fundus Exam      Right Left   Disc +CWS with surrounding mild IRH superior disc -- increased, ?NVD Pink and Sharp   C/D Ratio 0.4 0.5   Macula Blunted foveal reflex, +Cystic changes, Epiretinal membrane flat; no heme or edema   Vessels Vascular attenuation, Tortuous Vascular attenuation   Periphery attached; 360 Mas/DBH peripherally attached          IMAGING AND PROCEDURES  Imaging and Procedures for 04/25/17  OCT, Retina - OU - Both Eyes       Right Eye Quality was good. Central Foveal Thickness: 406. Progression has worsened. Findings  include abnormal foveal contour, epiretinal membrane, no SRF, intraretinal fluid (Nasal ERM ).   Left Eye Quality was good. Central Foveal Thickness: 288. Progression has been stable. Findings include normal foveal contour, no IRF, no SRF.   Notes *Images captured and stored on drive  Diagnosis / Impression:  OD: no IRF/SRF; CME remains resolved OS: NFP; no IRF/SRF  Clinical management:  See below  Abbreviations: NFP - Normal foveal profile. CME - cystoid macular edema. PED - pigment epithelial detachment. IRF - intraretinal fluid. SRF - subretinal fluid. EZ -  ellipsoid zone. ERM - epiretinal membrane. ORA - outer retinal atrophy. ORT - outer retinal tubulation. SRHM - subretinal hyper-reflective material        Intravitreal Injection, Pharmacologic Agent - OD - Right Eye       Time Out 01/01/2018. 4:50 PM. Confirmed correct patient, procedure, site, and patient consented.   Anesthesia Topical anesthesia was used. Anesthetic medications included Lidocaine 2%, Proparacaine 0.5%.   Procedure Preparation included 5% betadine to ocular surface, eyelid speculum. A 30 gauge needle was used.   Injection:  2 mg aflibercept Gretta Cool(EYLEA) SOLN   NDC: [id number], Lot: [id number], Expiration date: 11/02/2018   Route: Intravitreal, Site: Right Eye, Waste: 0.05 mL  Post-op Post injection exam found visual acuity of at least counting fingers. The patient tolerated the procedure well. There were no complications. The patient received written and verbal post procedure care education.                 ASSESSMENT/PLAN:    ICD-10-CM   1. Branch retinal vein occlusion of right eye with macular edema H34.8310 Intravitreal Injection, Pharmacologic Agent - OD - Right Eye  2. Retinal edema H35.81 OCT, Retina - OU - Both Eyes  3. 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 Essential hypertension I10   5. Hypertensive retinopathy of both eyes H35.033   6. Epiretinal membrane (ERM) of right eye H35.371   7. Pseudophakia of both eyes Z96.1   8. 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 Floaters in visual field, right H43.391     1,2. CME OD -- likely remote BRVO OD - by history, pt states symptoms first noticed 2 wks prior to presentation, but reports changes may have occurred prior - initial exam with differential tortuosity of vessels (OD > OS) - FA on 2.8.19 (initial visit) with petaloid leakage and hyperfluorescence of the disc - differential  includes DM2 (DME), hypertensive retinopathy, inflammatory etiology / uveitis - S/P IVA OD #1 (02.08.19), #2 (03.11.19), #3 (04.09.19), #4 (05.20.19) -- no significant improvement - review of OCTs show interval increase in CME OD after initial improvement post IVA #1 -- possible resistance to IVA  - June 2019 -- switched therapies: S/P IVE OD #1 (06.24.19), #2 (07.24.19), #3 (09.04.19), #4 (10.30.19) - repeat FA on 07.24.19 showed interval improvement in CME/ leakage OD - interval increase in CME today, 12.30.19 -- decrease interval to 6 weeks - interestingly, BCVA remains stable at 20/40+1 OD today - recommend IVE OD #5 (12.30.19) - RBA of procedure discussed, questions answered - informed consent obtained and signed - see procedure note - benefits investigation paperwork for Eylea signed on 06.24.19 - approved for Eylea through [employer] as of 06.26.19 - F/U 6 weeks -- DFE/OCT, FA (transit OD)  3. Mild nonproliferative diabetic retinopathy, both eyes - The incidence, risk factors for progression, natural history  and treatment options for diabetic retinopathy were discussed with patient.   - The need for close monitoring of blood glucose, blood pressure, and serum lipids, avoiding cigarette or any type of tobacco, and the need for long term follow up was also discussed with patient. - could be contributing to CME OD - OS with minimal diabetic retinopathy - continue to monitor  4,5. Hypertensive retinopathy OU - stable - as above, may have contributing to CME OD - discussed importance of tight BP control - monitor  6. Epiretinal membrane, right eye  The natural history, anatomy, potential for loss of vision, and treatment options including vitrectomy techniques and the complications of endophthalmitis, retinal detachment, vitreous hemorrhage, cataract progression and permanent vision loss discussed with the patient. - stable - no indication for surgery at this time  7. Pseudophakia  OU - s/p CE/IOL OU by cataract surgeon in [facility] - doing well - monitor  8. Optic disc edema OD -- sectoral - likely secondary to BRVO but differential includes carotid stenosis and retro-orbital mass - history of blood clots - recommend CT orbits w/ contrast to r/o retro-orbital mass -- not obtained - recommend carotid dopplers to r/o stenosis / occlusion -- normal study   Ophthalmic Meds Ordered this visit:  No orders of the defined types were placed in this encounter.      Return in about 6 weeks (around 02/12/2018) for F/U CME OD, DFE, OCT, FA.  There are no Patient Instructions on file for this visit.   Explained the diagnoses, plan, and follow up with the patient and they expressed understanding.  Patient expressed understanding of the importance of proper follow up care.   This document serves as a record of services personally performed by Karie Chimera, MD, PhD. It was created on their behalf by Laurian Brim, OA, an ophthalmic assistant. The creation of this record is the provider's dictation and/or activities during the visit.    Electronically signed by: Laurian Brim, OA  12.30.19 2:14 PM    Karie Chimera, M.D., Ph.D. Diseases & Surgery of the Retina and Vitreous [facility]  I have reviewed the above documentation for accuracy and completeness, and I agree with the above. Karie Chimera, M.D., Ph.D. 01/03/18 2:18 PM    Abbreviations: M myopia (nearsighted); A astigmatism; H hyperopia (farsighted); P presbyopia; Mrx spectacle prescription;  CTL contact lenses; OD right eye; OS left eye; OU both eyes  XT exotropia; ET esotropia; PEK punctate epithelial keratitis; PEE punctate epithelial erosions; DES dry eye syndrome; MGD meibomian gland dysfunction; ATs artificial tears; PFAT's preservative free artificial tears; NSC nuclear sclerotic cataract; PSC posterior subcapsular cataract; ERM epi-retinal membrane; PVD posterior vitreous detachment; RD  retinal detachment; DM diabetes mellitus; DR diabetic retinopathy; NPDR non-proliferative diabetic retinopathy; PDR proliferative diabetic retinopathy; CSME clinically significant macular edema; DME diabetic macular edema; dbh dot blot hemorrhages; CWS cotton wool spot; POAG primary open angle glaucoma; C/D cup-to-disc ratio; HVF humphrey visual field; GVF goldmann visual field; OCT optical coherence tomography; IOP intraocular pressure; BRVO Branch retinal vein occlusion; CRVO central retinal vein occlusion; CRAO central retinal artery occlusion; BRAO branch retinal artery occlusion; RT retinal tear; SB scleral buckle; PPV pars plana vitrectomy; VH Vitreous hemorrhage; PRP panretinal laser photocoagulation; IVK intravitreal kenalog; VMT vitreomacular traction; MH Macular hole;  NVD neovascularization of the disc; NVE neovascularization elsewhere; AREDS age related eye disease study; ARMD age related macular degeneration; POAG primary open angle glaucoma; EBMD epithelial/anterior basement membrane dystrophy; ACIOL  anterior chamber intraocular lens; IOL intraocular lens; PCIOL posterior chamber intraocular lens; Phaco/IOL phacoemulsification with intraocular lens placement; Garner photorefractive keratectomy; LASIK laser assisted in situ keratomileusis; HTN hypertension; DM diabetes mellitus; COPD chronic obstructive pulmonary disease

## 2018-01-03 ENCOUNTER — Encounter (INDEPENDENT_AMBULATORY_CARE_PROVIDER_SITE_OTHER): Payer: Self-pay | Admitting: Ophthalmology

## 2018-01-03 MED ORDER — AFLIBERCEPT 2MG/0.05ML IZ SOLN FOR KALEIDOSCOPE
2.0000 mg | INTRAVITREAL | Status: DC
  Administered 2018-01-03: 2 mg via INTRAVITREAL

## 2018-01-16 ENCOUNTER — Emergency Department: Payer: Medicare Other

## 2018-01-16 ENCOUNTER — Encounter: Payer: Self-pay | Admitting: Medical Oncology

## 2018-01-16 ENCOUNTER — Emergency Department
Admission: EM | Admit: 2018-01-16 | Discharge: 2018-01-16 | Disposition: A | Discharge status: Home / Self Care | Payer: Medicare Other | Attending: Emergency Medicine | Admitting: Emergency Medicine

## 2018-01-16 DIAGNOSIS — M79662 Pain in left lower leg: Secondary | ICD-10-CM | POA: Diagnosis present

## 2018-01-16 DIAGNOSIS — Z7902 Long term (current) use of antithrombotics/antiplatelets: Secondary | ICD-10-CM | POA: Diagnosis not present

## 2018-01-16 DIAGNOSIS — Z79899 Other long term (current) drug therapy: Secondary | ICD-10-CM | POA: Diagnosis not present

## 2018-01-16 DIAGNOSIS — M62831 Muscle spasm of calf: Secondary | ICD-10-CM | POA: Diagnosis not present

## 2018-01-16 DIAGNOSIS — E119 Type 2 diabetes mellitus without complications: Secondary | ICD-10-CM | POA: Diagnosis not present

## 2018-01-16 DIAGNOSIS — Z794 Long term (current) use of insulin: Secondary | ICD-10-CM | POA: Diagnosis not present

## 2018-01-16 DIAGNOSIS — Z87891 Personal history of nicotine dependence: Secondary | ICD-10-CM | POA: Insufficient documentation

## 2018-01-16 DIAGNOSIS — I10 Essential (primary) hypertension: Secondary | ICD-10-CM | POA: Diagnosis not present

## 2018-01-16 DIAGNOSIS — Z7982 Long term (current) use of aspirin: Secondary | ICD-10-CM | POA: Insufficient documentation

## 2018-01-16 DIAGNOSIS — M62838 Other muscle spasm: Secondary | ICD-10-CM

## 2018-01-16 DIAGNOSIS — Z8781 Personal history of (healed) traumatic fracture: Secondary | ICD-10-CM

## 2018-01-16 DIAGNOSIS — R52 Pain, unspecified: Secondary | ICD-10-CM

## 2018-01-16 IMAGING — CR DG PELVIS 1-2V
1 series · 1 of 1 positions shown · non-contrast
Comparison: CT [DATE]

CLINICAL DATA: Mid femur pain. History of fracture. No recent
injury.

EXAM:
PELVIS - 1-2 VIEW

[pelvis ap]
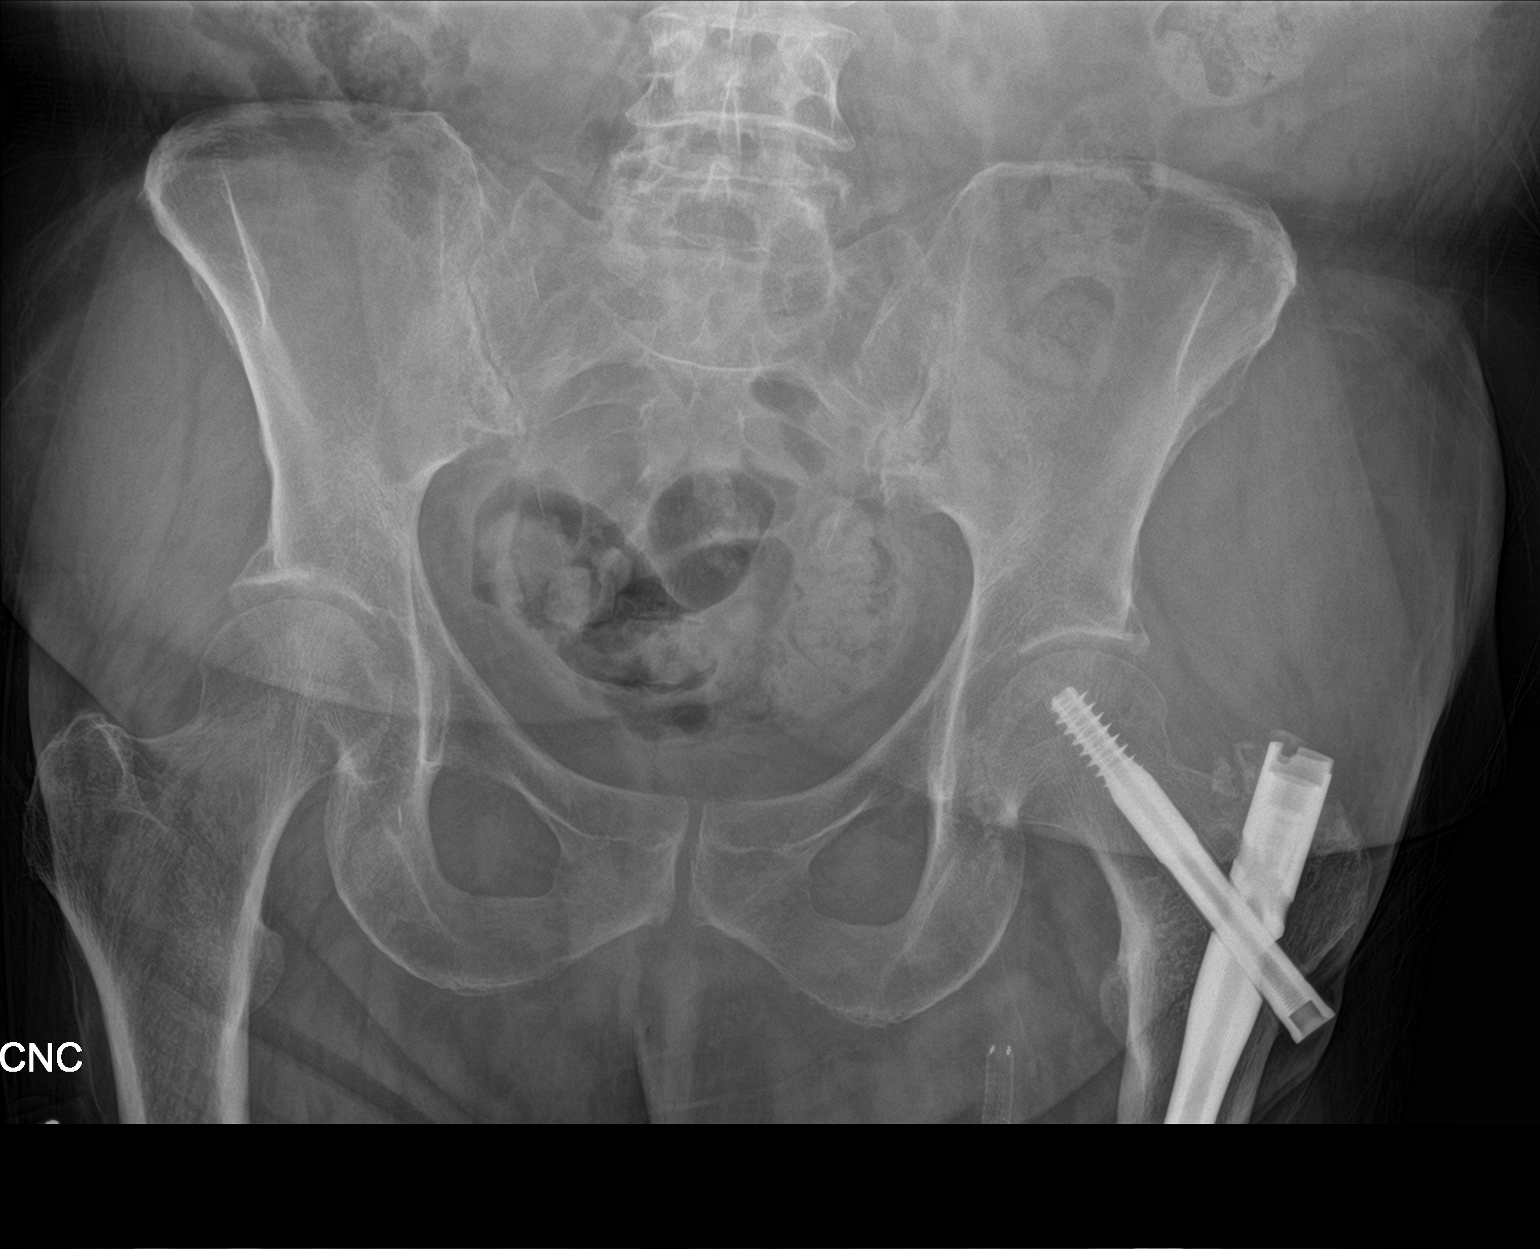

[1 of 1 positions shown; findings below may reference images not displayed]

FINDINGS: Hardware noted in the visualized proximal left femur. Vascular stent
in the left upper thigh soft tissues. Hip joint spaces are symmetric
and maintained. No acute bony abnormality. Specifically, no acute
fracture, subluxation, or dislocation.
IMPRESSION: No acute bony abnormality.

## 2018-01-16 IMAGING — CR DG FEMUR 2+V*L*
4 series · 4 of 4 positions shown · non-contrast
Comparison: None.

CLINICAL DATA: Mid left femur pain.  Remote history of injury.

EXAM:
LEFT FEMUR 2 VIEWS

[femur ap (1 of 2)]
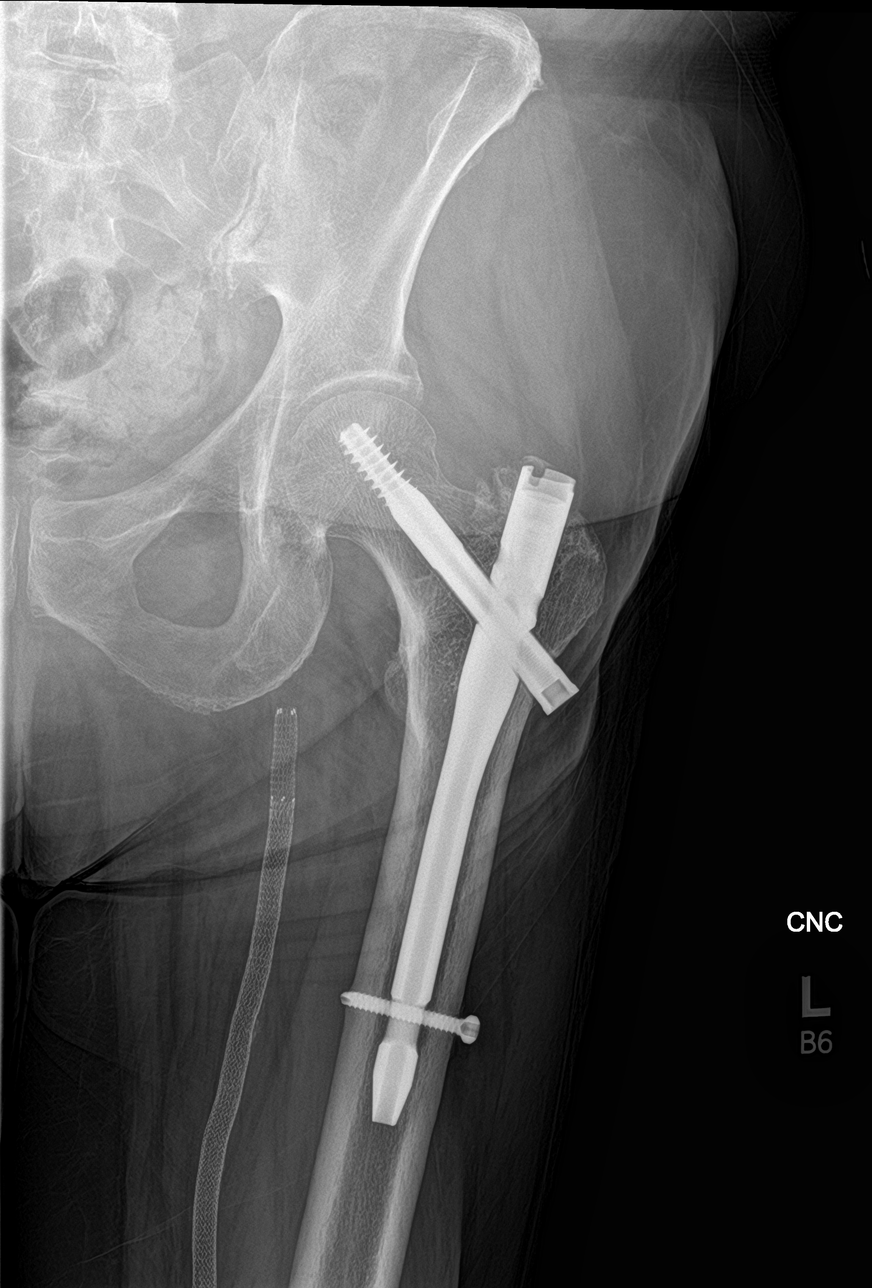

[femur ap (2 of 2)]
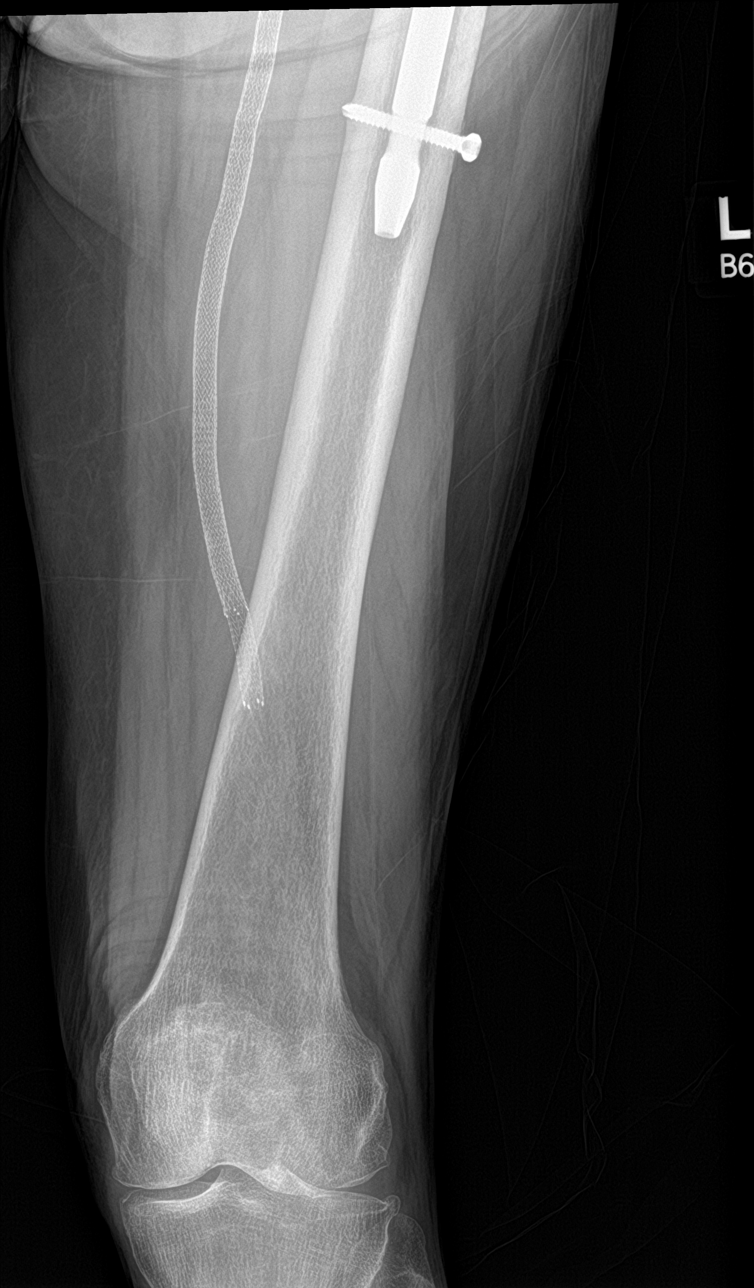

[femur lat (1 of 2)]
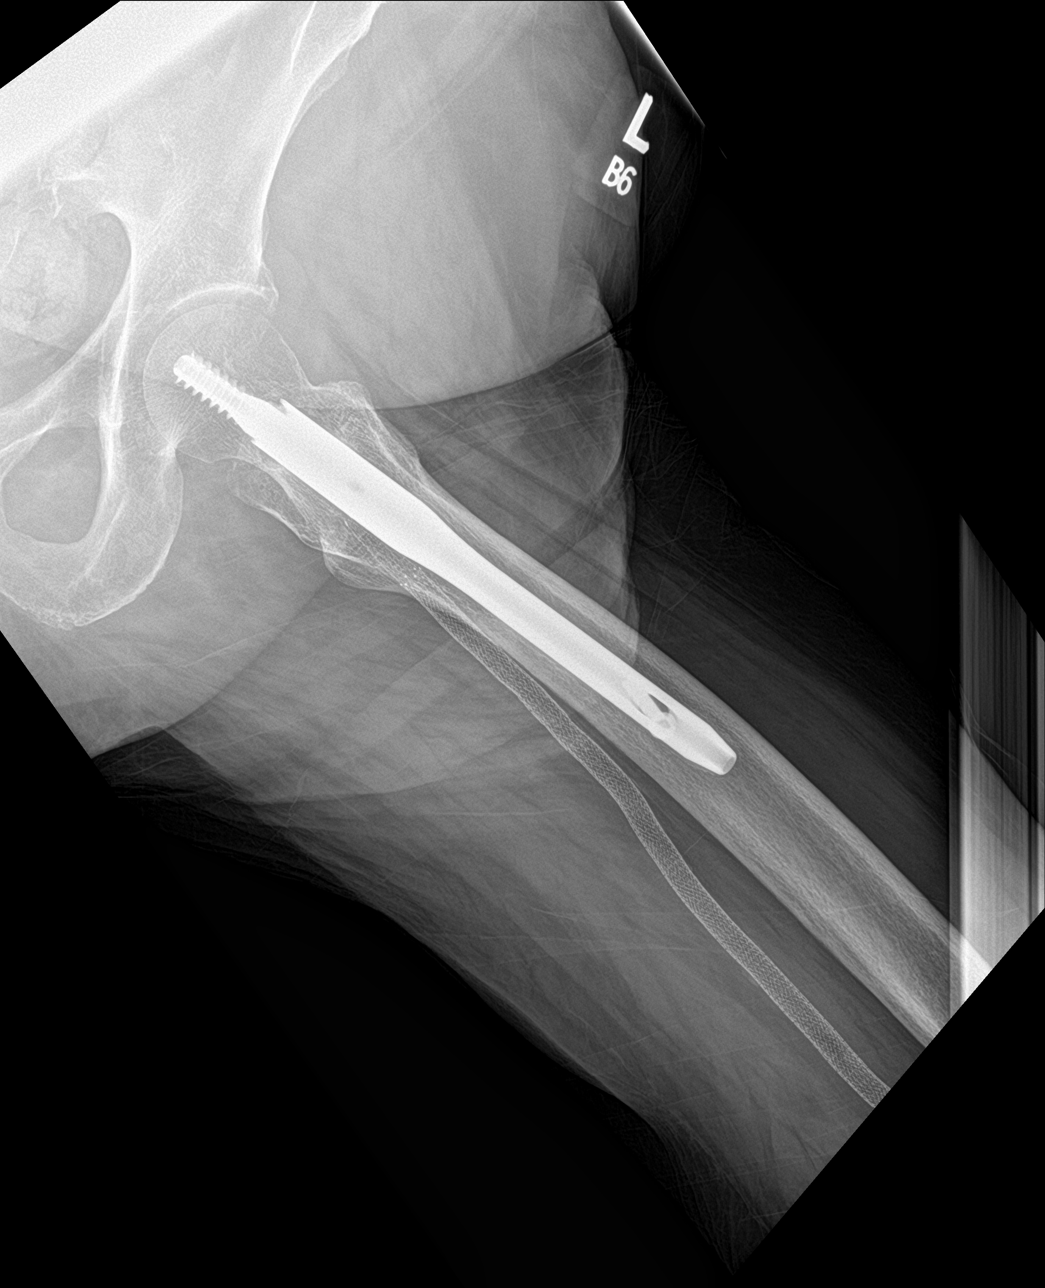

[femur lat (2 of 2)]
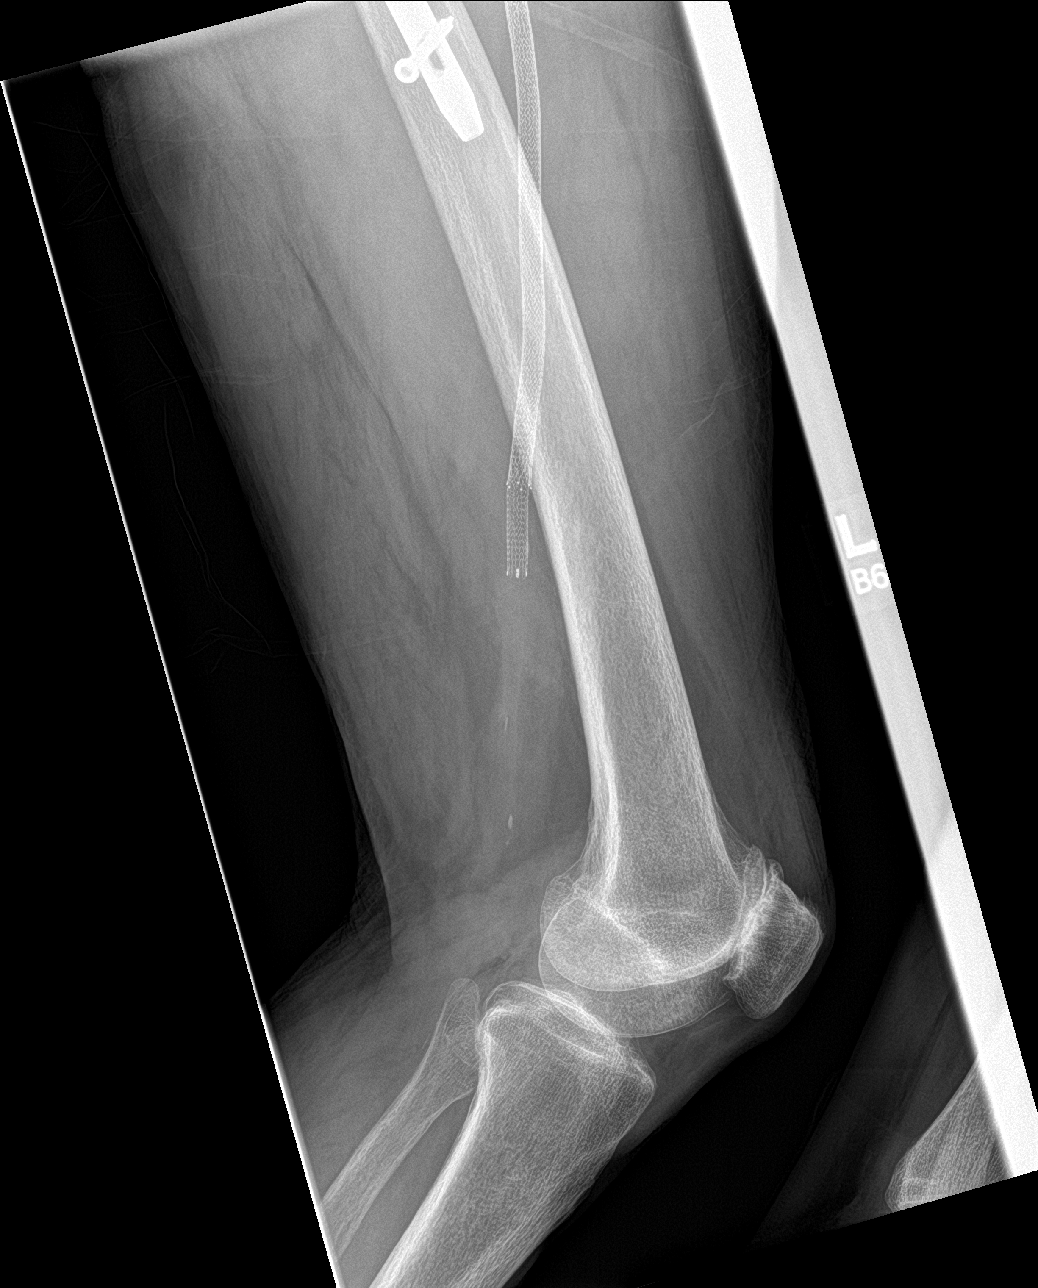

[4 of 4 positions shown; findings below may reference images not displayed]

FINDINGS: Hardware noted in the proximal femur. No hardware complicating
feature. Vascular stents noted from the proximal to distal thigh. No
acute bony abnormality. Specifically, no fracture, subluxation, or
dislocation. Moderate degenerative changes in the left knee most
pronounced in the patellofemoral compartment. No significant joint
effusion.
IMPRESSION: No acute bony abnormality.

## 2018-01-16 MED ORDER — BACLOFEN 10 MG PO TABS: 10.0000 mg | ORAL_TABLET | Freq: Every day | ORAL | 0 refills | Status: DC

## 2018-01-16 NOTE — ED Notes (Signed)
See triage note  Presents with left leg pain  States her left leg gave out today while walking  States she has a "rod " in same leg  And has done well until today

## 2018-01-16 NOTE — Discharge Instructions (Signed)
It sounds like you are having muscle spasms in your leg when you stand.  Please use your walker today for support.  You may take the muscle relaxer and be aware that it may make you drowsy.  Your x-ray does not show any complication with the hardware in your leg.  Your ultrasound does not show any signs of a blood clot.  I do not suspect any complications with your stents but I want you to follow-up with Dr. Wyn Quaker for an additional examination.  Please follow-up with Dr. Hyacinth Meeker tomorrow and Dr. Orland Jarred tomorrow at your scheduled appointments.

## 2018-01-16 NOTE — ED Provider Notes (Signed)
[facility] Emergency Department Provider Note  ____________________________________________  Time seen: Approximately 10:19 AM  I have reviewed the triage vital signs and the nursing notes.   HISTORY  Chief Complaint Leg Pain    HPI Cheryl Leonard is a 73 y.o. female that presents emergency department for evaluation of left thigh pain this morning.  Patient states that her leg "gave out" on her a couple of times this morning.  Patient denies any pain with sitting or laying.  Pain is when she puts pressure on that leg and her thigh feels like it it will "give out."She does not have any pain to the back of her leg.  She has pain to one spot in the center of her thigh after she has taken a couple of steps.  She was able to prevent herself from falling with her other leg.  She states that she fractured her femur 2 years ago and had a rod placed in this leg.  She had a stent placed in her leg in April and another in March.  She has a wound to her left great toe that she is seeing Dr. Orland Jarredroxler for.  She has an appointment with Dr. Orland Jarredroxler and her primary care provider tomorrow for follow-up.  She has an appointment in a couple of weeks with Dr. dew from vascular surgery.  Leg does not feel weak.  Pain stays in her left thigh and does not radiate.  No trauma.  She has peripheral neuropathy, which is not any different than usual.  No back pain, lower leg pain, calf pain, foot pain, swelling.  Past Medical History:  Diagnosis Date  . Anxiety   . Arthritis   . Cataracts, both eyes   . GERD (gastroesophageal reflux disease)   . Gout   . History of fracture of patella    right knee  . History of positive PPD    Patient always shows positive  . Hyperlipidemia   . Hypertension   . Hypothyroidism   . Lichen sclerosus 12/30/2013   of vulva  . Metatarsal fracture   . Neuropathy   . Peripheral vascular disease (HCC)   . Polyneuropathy    numbness and tingling in feet  and toes  . Renal insufficiency   . Type 2 diabetes mellitus, uncontrolled (HCC)     Patient Active Problem List   Diagnosis Date Noted  . Atherosclerosis of native arteries of the extremities with ulceration (HCC) 10/13/2017  . Carotid atherosclerosis, bilateral 07/11/2017  . Hyperlipidemia 03/01/2017  . Essential hypertension 03/01/2017  . PAD (peripheral artery disease) (HCC) 03/01/2017  . Skin ulcer of left great toe with fat layer exposed (HCC) 03/01/2017  . Hip fracture (HCC) 10/30/2015  . Greater trochanter fracture (HCC) 10/29/2015  . Iron deficiency anemia 09/22/2014  . Disease of thyroid gland 09/22/2014  . Diabetes mellitus type 2, uncontrolled (HCC) 05/13/2014    Past Surgical History:  Procedure Laterality Date  . APPENDECTOMY    . BREAST REDUCTION SURGERY  2001  . CATARACT EXTRACTION    . CESAREAN SECTION  1976  . COLONOSCOPY  03/05/2013   Nml - due for repeat 03/06/2018  . DILATION AND CURETTAGE OF UTERUS  1989  . ESOPHAGOGASTRODUODENOSCOPY  03/05/2013  . EYE SURGERY    . Eyelid Surgery  2012  . INTRAMEDULLARY (IM) NAIL INTERTROCHANTERIC Left 10/30/2015   Procedure: INTRAMEDULLARY (IM) NAIL INTERTROCHANTRIC ;  Surgeon: Kennedy BuckerMichael Menz, MD;  Location: [facility] ORS;  Service: Orthopedics;  Laterality: Left;  .  LAPAROSCOPIC HYSTERECTOMY  2000   total  . LOWER EXTREMITY ANGIOGRAPHY Left 03/08/2017   Procedure: LOWER EXTREMITY ANGIOGRAPHY;  Surgeon: Annice Needy, MD;  Location: [facility] INVASIVE CV LAB;  Service: Cardiovascular;  Laterality: Left;  . LOWER EXTREMITY ANGIOGRAPHY Left 10/30/2017   Procedure: LOWER EXTREMITY ANGIOGRAPHY;  Surgeon: Annice Needy, MD;  Location: [facility] INVASIVE CV LAB;  Service: Cardiovascular;  Laterality: Left;  . REDUCTION MAMMAPLASTY  1997    Prior to Admission medications   Medication Sig Start Date End Date Taking? Authorizing Provider  ALPRAZolam Prudy Feeler) 0.25 MG tablet Take 0.25 mg by mouth at bedtime as needed for sleep.     [provider]  amoxicillin (AMOXIL) 500 MG capsule BEGINNING MORNING OF PROCEDURE TAKE ONE CAPSULE 4 TIMES A DAY UNTIL FINISHED 08/28/17   [provider]  aspirin EC 81 MG tablet Take 81 mg by mouth daily.     [provider]  atorvastatin (LIPITOR) 20 MG tablet Take 20 mg by mouth at bedtime. 08/18/14   [provider]  baclofen (LIORESAL) 10 MG tablet Take 1 tablet (10 mg total) by mouth daily. 01/16/18 01/16/19  Enid Derry, PA-C  buPROPion (WELLBUTRIN XL) 150 MG 24 hr tablet Take 150 mg by mouth daily. Take with 300 mg to equal 450 mg once daily 08/10/16   [provider]  buPROPion (WELLBUTRIN XL) 150 MG 24 hr tablet Take by mouth. 08/17/17   [provider]  buPROPion (WELLBUTRIN XL) 300 MG 24 hr tablet Take 300 mg by mouth daily. Take with 150 mg to equal 450 mg once daily 09/06/14   [provider]  BYSTOLIC 5 MG tablet Take 5 mg by mouth daily. 08/30/14   [provider]  chlorhexidine (PERIDEX) 0.12 % solution RINSE FOR 30 SECONDS WITH 0.5 OZ TWICE A DAY 08/28/17   [provider]  cholecalciferol (VITAMIN D) 1000 units tablet Take 1,000 Units by mouth 2 (two) times daily.    [provider]  CINNAMON PO Take 1 tablet by mouth daily.    [provider]  clopidogrel (PLAVIX) 75 MG tablet Take 1 tablet (75 mg total) by mouth daily. 10/30/17   Annice Needy, MD  CORAL CALCIUM PO Take 1 tablet by mouth daily.    [provider]  denosumab (PROLIA) 60 MG/ML SOLN injection Inject into the skin. 03/15/17   [provider]  DENTA 5000 PLUS 1.1 % CREA dental cream 2 (two) times daily. as directed 10/17/17   [provider]  DULoxetine (CYMBALTA) 60 MG capsule Take 60 mg by mouth 2 (two) times daily.  06/17/14   [provider]  gabapentin (NEURONTIN) 100 MG capsule TAKE 1 CAPSULE BY MOUTH THREE TIMES A DAY 10/19/17   [provider]  gemfibrozil (LOPID) 600 MG tablet Take 600 mg by  mouth 2 (two) times daily. 08/25/14   [provider]  Insulin Pen Needle (B-D UF III MINI PEN NEEDLES) 31G X 5 MM MISC once daily. 07/04/16   [provider]  Iron-Vitamin C (VITRON-C) 65-125 MG TABS Take 1 tablet by mouth daily.     [provider]  JANUMET 50-1000 MG per tablet Take 1 tablet by mouth 2 (two) times daily with a meal. 08/10/14   [provider]  levothyroxine (SYNTHROID, LEVOTHROID) 100 MCG tablet Take 100 mcg by mouth daily. 08/03/14   [provider]  lisinopril (PRINIVIL,ZESTRIL) 10 MG tablet Take 10 mg by mouth daily.  [provider]  Magnesium 500 MG TABS Take 500 mg by mouth daily.    [provider]  Multiple Vitamin (MULTIVITAMIN WITH MINERALS) TABS tablet Take 1 tablet by mouth daily.    [provider]  mupirocin ointment (BACTROBAN) 2 % APPLY TO AFFECTED AREA 3 TIMES A DAY 10/27/17   [provider]  Omega-3 Fatty Acids (FISH OIL) 1000 MG CAPS Take 1,000 mg by mouth daily.    [provider]  oxyCODONE-acetaminophen (PERCOCET/ROXICET) 5-325 MG tablet Take 1 tablet by mouth every 4 (four) hours as needed. 09/14/17   [provider]  pantoprazole (PROTONIX) 40 MG tablet Take 40 mg by mouth daily.     [provider]  ranitidine (ZANTAC) 300 MG capsule Take 300 mg by mouth at bedtime. 07/15/14   [provider]  TRESIBA FLEXTOUCH 200 UNIT/ML SOPN INJECT 30 UNITS SUB-Q NIGHTLY 11/15/16   [provider]  vitamin B-12 (CYANOCOBALAMIN) 1000 MCG tablet Take 1,000 mcg by mouth daily.    [provider]  vitamin E 400 UNIT capsule Take 400 Units by mouth daily.    [provider]  zolpidem (AMBIEN) 10 MG tablet Take 10 mg by mouth at bedtime. 07/09/14   [provider]    Allergies Patient has no known allergies.  Family History  Problem Relation Age of Onset  . Coronary artery disease Father   . Heart attack Father   .  Coronary artery disease Mother   . Heart attack Mother   . Ovarian cancer Sister 8       sister had hormonal therapy for IVF txs-which increased risk factor for ovarian cancer  . Breast cancer Neg Hx     Social History Social History   Tobacco Use  . Smoking status: Former Smoker    Packs/day: 1.00    Years: 20.00    Pack years: 20.00    Types: Cigarettes    Last attempt to quit: 03/07/1996    Years since quitting: 21.8  . Smokeless tobacco: Never Used  . Tobacco comment: started smoking at age 82  Substance Use Topics  . Alcohol use: No    Alcohol/week: 0.0 standard drinks  . Drug use: No     Review of Systems  Cardiovascular: No chest pain. Respiratory:  No SOB. Gastrointestinal: No abdominal pain.  No nausea, no vomiting.  Musculoskeletal: Positive for thigh pain Skin: Negative for rash, abrasions, lacerations, ecchymosis. Neurological: Negative for headaches, numbness or tingling   ____________________________________________   PHYSICAL EXAM:  VITAL SIGNS: ED Triage Vitals [01/16/18 0929]  Enc Vitals Group     BP 136/63     Pulse Rate 69     Resp 16     Temp 98.2 F (36.8 C)     Temp Source Oral     SpO2 98 %     Weight 160 lb (72.6 kg)     Height 5\' 5"  (1.651 m)     Head Circumference      Peak Flow      Pain Score 8     Pain Loc      Pain Edu?      Excl. in GC?      Constitutional: Alert and oriented. Well appearing and in no acute distress. Eyes: Conjunctivae are normal. PERRL. EOMI. Head: Atraumatic. ENT:      Ears:      Nose: No congestion/rhinnorhea.      Mouth/Throat: Mucous membranes are moist.  Neck: No stridor.  Cardiovascular: Normal rate, regular rhythm.  Good peripheral circulation.  Palpable dorsalis pedis pulses bilaterally. Respiratory: Normal respiratory effort without tachypnea or retractions. Lungs CTAB. Good air entry to the bases with no decreased or absent breath sounds. Musculoskeletal: Full range of motion to all  extremities. No gross deformities appreciated.  No tenderness to palpation of left leg.  Able to bear weight.  No swelling to lower extremities bilaterally.  Ulcer to left great toe.  No erythema or drainage.  No ecchymosis to left foot or toes.  Foot is warm to touch. Neurologic:  Normal speech and language. No gross focal neurologic deficits are appreciated.  Skin:  Skin is warm, dry and intact. No rash noted. Psychiatric: Mood and affect are normal. Speech and behavior are normal. Patient exhibits appropriate insight and judgement.   ____________________________________________   LABS (all labs ordered are listed, but only abnormal results are displayed)  Labs Reviewed - No data to display ____________________________________________  EKG   ____________________________________________  RADIOLOGY Lexine Baton, personally viewed and evaluated these images (plain radiographs) as part of my medical decision making, as well as reviewing the written report by the radiologist.  Dg Pelvis 1-2 Views  Result Date: 01/16/2018 CLINICAL DATA:  Mid femur pain. History of fracture. No recent injury. EXAM: PELVIS - 1-2 VIEW COMPARISON:  CT 10/29/2015 FINDINGS: Hardware noted in the visualized proximal left femur. Vascular stent in the left upper thigh soft tissues. Hip joint spaces are symmetric and maintained. No acute bony abnormality. Specifically, no acute fracture, subluxation, or dislocation. IMPRESSION: No acute bony abnormality. Electronically Signed   By: Charlett Nose M.D.   On: 01/16/2018 10:40   US Venous Img Lower Unilateral Left  Result Date: 01/16/2018 CLINICAL DATA:  -year-old female with left lower extremity pain EXAM: LEFT LOWER EXTREMITY VENOUS DOPPLER ULTRASOUND TECHNIQUE: Gray-scale sonography with graded compression, as well as color Doppler and duplex ultrasound were performed to evaluate the lower extremity deep venous systems from the level of the common femoral vein and  including the common femoral, femoral, profunda femoral, popliteal and calf veins including the posterior tibial, peroneal and gastrocnemius veins when visible. The superficial great saphenous vein was also interrogated. Spectral Doppler was utilized to evaluate flow at rest and with distal augmentation maneuvers in the common femoral, femoral and popliteal veins. COMPARISON:  None. FINDINGS: Contralateral Common Femoral Vein: Respiratory phasicity is normal and symmetric with the symptomatic side. No evidence of thrombus. Normal compressibility. Common Femoral Vein: No evidence of thrombus. Normal compressibility, respiratory phasicity and response to augmentation. Saphenofemoral Junction: No evidence of thrombus. Normal compressibility and flow on color Doppler imaging. Profunda Femoral Vein: No evidence of thrombus. Normal compressibility and flow on color Doppler imaging. Femoral Vein: No evidence of thrombus. Normal compressibility, respiratory phasicity and response to augmentation. Popliteal Vein: No evidence of thrombus. Normal compressibility, respiratory phasicity and response to augmentation. Calf Veins: No evidence of thrombus. Normal compressibility and flow on color Doppler imaging. Superficial Great Saphenous Vein: No evidence of thrombus. Normal compressibility. Venous Reflux:  None. Other Findings:  None. IMPRESSION: No evidence of deep venous thrombosis. Electronically Signed   By: Malachy Moan M.D.   On: 01/16/2018 11:22   Dg Femur Min 2 Views Left  Result Date: 01/16/2018 CLINICAL DATA:  Mid left femur pain.  Remote history of injury. EXAM: LEFT FEMUR 2 VIEWS COMPARISON:  None. FINDINGS: Hardware noted in the proximal femur. No hardware complicating feature. Vascular stents noted from the proximal to distal thigh. No  acute bony abnormality. Specifically, no fracture, subluxation, or dislocation. Moderate degenerative changes in the left knee most pronounced in the patellofemoral  compartment. No significant joint effusion. IMPRESSION: No acute bony abnormality. Electronically Signed   By: Charlett Nose M.D.   On: 01/16/2018 10:41    ____________________________________________    PROCEDURES  Procedure(s) performed:    Procedures    Medications - No data to display   ____________________________________________   INITIAL IMPRESSION / ASSESSMENT AND PLAN / ED COURSE  Pertinent labs & imaging results that were available during my care of the patient were reviewed by me and considered in my medical decision making (see chart for details).  Review of the [place] CSRS was performed in accordance of the [facility] prior to dispensing any controlled drugs.     Patient presented to the emergency department for evaluation of left thigh pain this morning.  Vital signs and exam are reassuring.  Exam is consistent with a muscle spasm of her quadriceps.  Ultrasound negative for DVT.  X-rays negative for acute bony abnormalities.  I do not suspect any complication from her stent placements.  Patient has good pulses to her feet.  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 Orland Jarred tomorrow for.  She also has an appointment with primary care tomorrow.  She sees Dr. dew with vascular surgery this month.  Patient has a walker at home.  Patient will be discharged home with prescriptions for baclofen. Patient is to follow up with primary care, podiatry, vascular surgery as directed. Patient is given ED precautions to return to the ED for any worsening or new symptoms.     ____________________________________________  FINAL CLINICAL IMPRESSION(S) / ED DIAGNOSES  Final diagnoses:  Muscle spasm      NEW MEDICATIONS STARTED DURING THIS VISIT:  ED Discharge Orders         Ordered    baclofen (LIORESAL) 10 MG tablet  Daily     01/16/18 1146              This chart was dictated using voice recognition software/Dragon. Despite best  efforts to proofread, errors can occur which can change the meaning. Any change was purely unintentional.    Enid Derry, PA-C 01/16/18 1606    Minna Antis, MD 01/17/18 2153

## 2018-01-16 NOTE — ED Triage Notes (Signed)
Pt here with reports that this am she began having pain to left upper leg, pain only when standing. Pt reports hx of rod placed to that area 2 years ago. Pt in NAD, got into wheelchair without difficulty.

## 2018-02-08 NOTE — Progress Notes (Signed)
[facility] Note  02/12/2018     CHIEF COMPLAINT Patient presents for Retina Follow Up   HISTORY OF PRESENT ILLNESS: Cheryl Leonard is a 73 y.o. female who presents to the clinic today for:   HPI    Retina Follow Up    Patient presents with  CRVO/BRVO.  In right eye.  This started 5 months ago.  Severity is moderate.  Duration of 6 weeks.  Since onset it is stable.  I, the attending physician,  performed the HPI with the patient and updated documentation appropriately.          Comments    73 y/o female pt here for 6 wk f/u for BRVO w/mac edema OD.  No change in [place] OU.  Denies pain, flashes, floaters.  AT prn OU.       Last edited by Bernarda Caffey, MD on 02/12/2018  4:48 PM. (History)       Referring physician: Rusty Aus, MD [place], [place] [address]  HISTORICAL INFORMATION:   Selected notes from the MEDICAL RECORD NUMBER Referred by Dr. Marvel Plan for concern of DME OD;  LEE- 02.05.19 [BCVA OD: 20/70-1 OS: 20/30-1] Ocular Hx-  PMH- DM Lab Results  Component Value Date   HGBA1C 5.9 (H) 10/29/2015       CURRENT MEDICATIONS: No current outpatient medications on file. (Ophthalmic Drugs)   Current Facility-Administered Medications (Ophthalmic Drugs)  Medication Route  . aflibercept (EYLEA) SOLN 2 mg Intravitreal  . aflibercept (EYLEA) SOLN 2 mg Intravitreal  . aflibercept (EYLEA) SOLN 2 mg Intravitreal  . aflibercept (EYLEA) SOLN 2 mg Intravitreal  . aflibercept (EYLEA) SOLN 2 mg Intravitreal   Current Outpatient Medications (Other)  Medication Sig  . ALPRAZolam (XANAX) 0.25 MG tablet Take 0.25 mg by mouth at bedtime as needed for sleep.   [name] amoxicillin (AMOXIL) 500 MG capsule BEGINNING MORNING OF PROCEDURE TAKE ONE CAPSULE 4 TIMES A DAY UNTIL FINISHED  . aspirin EC 81 MG tablet Take 81 mg by mouth daily.   [name] atorvastatin (LIPITOR) 20 MG tablet Take 20 mg by mouth at  bedtime.  . baclofen (LIORESAL) 10 MG tablet Take 1 tablet (10 mg total) by mouth daily.  [name] buPROPion (WELLBUTRIN XL) 150 MG 24 hr tablet Take 150 mg by mouth daily. Take with 300 mg to equal 450 mg once daily  . buPROPion (WELLBUTRIN XL) 150 MG 24 hr tablet Take by mouth.  [name] buPROPion (WELLBUTRIN XL) 300 MG 24 hr tablet Take 300 mg by mouth daily. Take with 150 mg to equal 450 mg once daily  . BYSTOLIC 5 MG tablet Take 5 mg by mouth daily.  . chlorhexidine (PERIDEX) 0.12 % solution RINSE FOR 30 SECONDS WITH 0.5 OZ TWICE A DAY  . cholecalciferol (VITAMIN D) 1000 units tablet Take 1,000 Units by mouth 2 (two) times daily.  [name] CINNAMON PO Take 1 tablet by mouth daily.  . clopidogrel (PLAVIX) 75 MG tablet Take 1 tablet (75 mg total) by mouth daily.  [name] CORAL CALCIUM PO Take 1 tablet by mouth daily.  [name] denosumab (PROLIA) 60 MG/ML SOLN injection Inject into the skin.  . DENTA 5000 PLUS 1.1 % CREA dental cream 2 (two) times daily. as directed  . DULoxetine (CYMBALTA) 60 MG capsule Take 60 mg by mouth 2 (two) times daily.   [name] gabapentin (NEURONTIN) 100 MG capsule TAKE 1 CAPSULE BY MOUTH THREE TIMES A DAY  .  gemfibrozil (LOPID) 600 MG tablet Take 600 mg by mouth 2 (two) times daily.  . Insulin Pen Needle (B-D UF III MINI PEN NEEDLES) 31G X 5 MM MISC once daily.  . Iron-Vitamin C (VITRON-C) 65-125 MG TABS Take 1 tablet by mouth daily.   [name] JANUMET 50-1000 MG per tablet Take 1 tablet by mouth 2 (two) times daily with a meal.  . levothyroxine (SYNTHROID, LEVOTHROID) 100 MCG tablet Take 100 mcg by mouth daily.  [name] lisinopril (PRINIVIL,ZESTRIL) 10 MG tablet Take 10 mg by mouth daily.   . Magnesium 500 MG TABS Take 500 mg by mouth daily.  . Multiple Vitamin (MULTIVITAMIN WITH MINERALS) TABS tablet Take 1 tablet by mouth daily.  . mupirocin ointment (BACTROBAN) 2 % APPLY TO AFFECTED AREA 3 TIMES A DAY  . Omega-3 Fatty Acids (FISH OIL) 1000 MG CAPS Take 1,000 mg by mouth daily.  [name] oxyCODONE-acetaminophen  (PERCOCET/ROXICET) 5-325 MG tablet Take 1 tablet by mouth every 4 (four) hours as needed.  . pantoprazole (PROTONIX) 40 MG tablet Take 40 mg by mouth daily.   . ranitidine (ZANTAC) 300 MG capsule Take 300 mg by mouth at bedtime.  . ranitidine (ZANTAC) 300 MG tablet   . TRESIBA FLEXTOUCH 200 UNIT/ML SOPN INJECT 30 UNITS SUB-Q NIGHTLY  . vitamin B-12 (CYANOCOBALAMIN) 1000 MCG tablet Take 1,000 mcg by mouth daily.  . vitamin E 400 UNIT capsule Take 400 Units by mouth daily.  [name] zolpidem (AMBIEN) 10 MG tablet Take 10 mg by mouth at bedtime.   Current Facility-Administered Medications (Other)  Medication Route  . Bevacizumab (AVASTIN) SOLN 1.25 mg Intravitreal  . Bevacizumab (AVASTIN) SOLN 1.25 mg Intravitreal  . Bevacizumab (AVASTIN) SOLN 1.25 mg Intravitreal  . Bevacizumab (AVASTIN) SOLN 1.25 mg Intravitreal      REVIEW OF SYSTEMS: ROS    Positive for: Endocrine, Cardiovascular, Eyes   Negative for: Constitutional, Gastrointestinal, Neurological, Skin, Genitourinary, Musculoskeletal, HENT, Respiratory, Psychiatric, Allergic/Imm, Heme/Lymph   Last edited by Matthew Folks, COA on 02/12/2018  3:02 PM. (History)       ALLERGIES No Known Allergies  PAST MEDICAL HISTORY Past Medical History:  Diagnosis Date  . Anxiety   . Arthritis   . Cataracts, both eyes   . GERD (gastroesophageal reflux disease)   . Gout   . History of fracture of patella    right knee  . History of positive PPD    Patient always shows positive  . Hyperlipidemia   . Hypertension   . Hypothyroidism   . Lichen sclerosus 17/51/0258   of vulva  . Metatarsal fracture   . Neuropathy   . Peripheral vascular disease ([place])   . Polyneuropathy    numbness and tingling in feet and toes  . Renal insufficiency   . Type 2 diabetes mellitus, uncontrolled ([place])    Past Surgical History:  Procedure Laterality Date  . APPENDECTOMY    . BREAST REDUCTION SURGERY  2001  . CATARACT EXTRACTION    . CESAREAN SECTION   1976  . COLONOSCOPY  03/05/2013   Nml - due for repeat 03/06/2018  . DILATION AND CURETTAGE OF UTERUS  1989  . ESOPHAGOGASTRODUODENOSCOPY  03/05/2013  . EYE SURGERY    . Eyelid Surgery  2012  . INTRAMEDULLARY (IM) NAIL INTERTROCHANTERIC Left 10/30/2015   Procedure: INTRAMEDULLARY (IM) NAIL INTERTROCHANTRIC ;  Surgeon: Hessie Knows, MD;  Location: [facility] ORS;  Service: Orthopedics;  Laterality: Left;  . LAPAROSCOPIC HYSTERECTOMY  2000   total  . LOWER EXTREMITY ANGIOGRAPHY  Left 03/08/2017   Procedure: LOWER EXTREMITY ANGIOGRAPHY;  Surgeon: Algernon Huxley, MD;  Location: [facility];  Service: Cardiovascular;  Laterality: Left;  . LOWER EXTREMITY ANGIOGRAPHY Left 10/30/2017   Procedure: LOWER EXTREMITY ANGIOGRAPHY;  Surgeon: Algernon Huxley, MD;  Location: [facility];  Service: Cardiovascular;  Laterality: Left;  . REDUCTION MAMMAPLASTY  1997    FAMILY HISTORY Family History  Problem Relation Age of Onset  . Coronary artery disease Father   . Heart attack Father   . Coronary artery disease Mother   . Heart attack Mother   . Ovarian cancer Sister 52       sister had hormonal therapy for IVF txs-which increased risk factor for ovarian cancer  . Breast cancer Neg Hx     SOCIAL HISTORY Social History   Tobacco Use  . Smoking status: Former Smoker    Packs/day: 1.00    Years: 20.00    Pack years: 20.00    Types: Cigarettes    Last attempt to quit: 03/07/1996    Years since quitting: 21.9  . Smokeless tobacco: Never Used  . Tobacco comment: started smoking at age 74  Substance Use Topics  . Alcohol use: No    Alcohol/week: 0.0 standard drinks  . Drug use: No         OPHTHALMIC EXAM:  Base Eye Exam    Visual Acuity (Snellen - Linear)      Right Left   Dist [place] 20/40 20/30   Dist ph [place]  20/25       Tonometry (Tonopen, 3:04 PM)      Right Left   Pressure 13 13       Pupils      Dark Light Shape React APD   Right 3 2 Round Brisk None   Left 3 2 Round Brisk  None       Visual Fields (Counting fingers)      Left Right    Full Full       Extraocular Movement      Right Left    Full, Ortho Full, Ortho       Neuro/Psych    Oriented x3:  Yes   Mood/Affect:  Normal       Dilation    Both eyes:  1.0% Mydriacyl, 2.5% Phenylephrine @ 3:04 PM        Slit Lamp and Fundus Exam    External Exam      Right Left   External Normal Normal       Slit Lamp Exam      Right Left   Lids/Lashes dermatochalasis dermatochalasis   Conjunctiva/Sclera White and quiet White and quiet   Cornea arcus; well healed cataract wound; 1+ Punctate epithelial erosions, decreased TBUT, mild Anterior basement membrane dystrophy superiorly arcus; well healed cataract wound, 1+ Punctate epithelial erosions, irregualr epi surface, decreased TBUT   Anterior Chamber Deep and quiet Deep and quiet   Iris Round and dilated Round and dilated   Lens PCIOL; open PC PCIOL; open PC   Vitreous syneresis, Posterior vitreous detachment, floaters inferiorly, trace-1+ cell syneresis, Posterior vitreous detachment       Fundus Exam      Right Left   Disc +CWS with surrounding mild IRH superior disc Pink and Sharp   C/D Ratio 0.4 0.5   Macula Blunted foveal reflex, +Cystic changes, Epiretinal membrane flat; no heme or edema   Vessels Vascular attenuation, Tortuous Vascular attenuation   Periphery  attached; 360 Mas/DBH peripherally attached          IMAGING AND PROCEDURES  Imaging and Procedures for 04/25/17  OCT, Retina - OU - Both Eyes       Right Eye Quality was good. Central Foveal Thickness: 357. Progression has improved. Findings include abnormal foveal contour, epiretinal membrane, no SRF, intraretinal fluid (Interval improvement in IRF ).   Left Eye Quality was good. Central Foveal Thickness: 288. Progression has been stable. Findings include normal foveal contour, no IRF, no SRF.   Notes *Images captured and stored on drive  Diagnosis / Impression:  OD:  +IRF/CME centrally; no SRF -- interval improvement in IRF OS: NFP; no IRF/SRF  Clinical management:  See below  Abbreviations: NFP - Normal foveal profile. CME - cystoid macular edema. PED - pigment epithelial detachment. IRF - intraretinal fluid. SRF - subretinal fluid. EZ - ellipsoid zone. ERM - epiretinal membrane. ORA - outer retinal atrophy. ORT - outer retinal tubulation. SRHM - subretinal hyper-reflective material        Intravitreal Injection, Pharmacologic Agent - OD - Right Eye       Time Out 02/12/2018. 4:52 PM. Confirmed correct patient, procedure, site, and patient consented.   Anesthesia Topical anesthesia was used. Anesthetic medications included Lidocaine 2%, Proparacaine 0.5%.   Procedure Preparation included 5% betadine to ocular surface, eyelid speculum. A supplied needle was used.   Injection:  1.25 mg Bevacizumab (AVASTIN) SOLN   NDC: 54270-623-76, Lot: [id number] , Expiration date: 02/26/2018   Route: Intravitreal, Site: Right Eye, Waste: 0 mL  Post-op Post injection exam found visual acuity of at least counting fingers. The patient tolerated the procedure well. There were no complications. The patient received written and verbal post procedure care education.        Fluorescein Angiography [place] (Transit OD)       Right Eye   Progression has improved. Early phase findings include staining, neovascularization disc. Mid/Late phase findings include leakage, staining.   Left Eye   Progression has been stable. Early phase findings include normal observations. Mid/Late phase findings include normal observations.   Notes Images stored on drive;   Impression: OD: mild late staining / leakage in macula, staining / leakage of disc -- improved CME OS: normal study                ASSESSMENT/PLAN:    ICD-10-CM   1. Branch retinal vein occlusion of right eye with macular edema H34.8310 Intravitreal Injection, Pharmacologic Agent - OD -  Right Eye    Fluorescein Angiography [place] (Transit OD)    Bevacizumab (AVASTIN) SOLN 1.25 mg  2. Retinal edema H35.81 OCT, Retina - OU - Both Eyes  3. 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 Essential hypertension I10   5. Hypertensive retinopathy of both eyes H35.033 Fluorescein Angiography [place] (Transit OD)  6. Epiretinal membrane (ERM) of right eye H35.371   7. Pseudophakia of both eyes Z96.1   8. 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 Floaters in visual field, right H43.391     1,2. CME OD -- likely remote BRVO OD  - by history, pt states symptoms first noticed 2 wks prior to presentation, but reports changes may have occurred prior  - initial exam with differential tortuosity of vessels (OD > OS)  - FA (02.10.20) shows mild late staining / leakage in macula, staining / leakage of disc --  improving CME  - differential includes DM2 (DME), hypertensive retinopathy, inflammatory etiology / uveitis  - S/P IVA OD #1 (02.08.19), #2 (03.11.19), #3 (04.09.19), #4 (05.20.19) -- no significant improvement  - review of OCTs show interval increase in CME OD after initial improvement post IVA #1 -- possible resistance to IVA   - June 2019 -- switched therapies: S/P IVE OD #1 (06.24.19), #2 (07.24.19), #3 (09.04.19), #4 (10.30.19),#5 (12.30.19) - OCT today shows interval improvement in IRF - BCVA remains stable at 20/40 OD today - Eylea4U benefits investigation for 2020 remains pending - recommend IVA OD #5 (02.10.20) - RBA of procedure discussed, questions answered - informed consent obtained and signed - see procedure note - F/U 6 weeks -- DFE/OCT  3. Mild nonproliferative diabetic retinopathy, both eyes - The incidence, risk factors for progression, natural history and treatment options for diabetic retinopathy were discussed with patient.   - The need for close  monitoring of blood glucose, blood pressure, and serum lipids, avoiding cigarette or any type of tobacco, and the need for long term follow up was also discussed with patient. - could be contributing to CME OD - OS with minimal diabetic retinopathy - continue to monitor  4,5. Hypertensive retinopathy OU - stable - as above, may have contributing to CME OD - discussed importance of tight BP control - monitor  6. Epiretinal membrane, right eye  The natural history, anatomy, potential for loss of vision, and treatment options including vitrectomy techniques and the complications of endophthalmitis, retinal detachment, vitreous hemorrhage, cataract progression and permanent vision loss discussed with the patient. - stable - no indication for surgery at this time  7. Pseudophakia OU - s/p CE/IOL OU by cataract surgeon in [facility] - doing well - monitor  8. Optic disc edema OD -- sectoral - likely secondary to BRVO but differential includes carotid stenosis and retro-orbital mass - history of blood clots - recommend CT orbits w/ contrast to r/o retro-orbital mass -- not obtained - recommend carotid dopplers to r/o stenosis / occlusion -- normal study   Ophthalmic Meds Ordered this visit:  Meds ordered this encounter  Medications  . Bevacizumab (AVASTIN) SOLN 1.25 mg       Return in about 6 weeks (around 03/26/2018) for f/u CME OD, DFE, OCT.  There are no Patient Instructions on file for this visit.   Explained the diagnoses, plan, and follow up with the patient and they expressed understanding.  Patient expressed understanding of the importance of proper follow up care.   This document serves as a record of services personally performed by Gardiner Sleeper, MD, PhD. It was created on their behalf by Ernest Mallick, OA, an ophthalmic assistant. The creation of this record is the provider's dictation and/or activities during the visit.    Electronically signed by: Ernest Mallick, OA   02.06.2020 9:55 PM     Gardiner Sleeper, M.D., Ph.D. Diseases & Surgery of the Retina and Vitreous [facility]  I have reviewed the above documentation for accuracy and completeness, and I agree with the above. Gardiner Sleeper, M.D., Ph.D. 02/12/18 10:28 PM     Abbreviations: M myopia (nearsighted); A astigmatism; H hyperopia (farsighted); P presbyopia; Mrx spectacle prescription;  CTL contact lenses; OD right eye; OS left eye; OU both eyes  XT exotropia; ET esotropia; PEK punctate epithelial keratitis; PEE punctate epithelial erosions; DES dry eye syndrome; MGD meibomian gland dysfunction; ATs artificial tears; PFAT's preservative free artificial tears; [place] nuclear  sclerotic cataract; PSC posterior subcapsular cataract; ERM epi-retinal membrane; PVD posterior vitreous detachment; RD retinal detachment; DM diabetes mellitus; DR diabetic retinopathy; NPDR non-proliferative diabetic retinopathy; PDR proliferative diabetic retinopathy; CSME clinically significant macular edema; DME diabetic macular edema; dbh dot blot hemorrhages; CWS cotton wool spot; POAG primary open angle glaucoma; C/D cup-to-disc ratio; HVF humphrey visual field; GVF goldmann visual field; OCT optical coherence tomography; IOP intraocular pressure; BRVO Branch retinal vein occlusion; CRVO central retinal vein occlusion; CRAO central retinal artery occlusion; BRAO branch retinal artery occlusion; RT retinal tear; SB scleral buckle; PPV pars plana vitrectomy; VH Vitreous hemorrhage; PRP panretinal laser photocoagulation; IVK intravitreal kenalog; VMT vitreomacular traction; MH Macular hole;  NVD neovascularization of the disc; NVE neovascularization elsewhere; AREDS age related eye disease study; ARMD age related macular degeneration; POAG primary open angle glaucoma; EBMD epithelial/anterior basement membrane dystrophy; ACIOL anterior chamber intraocular lens; IOL intraocular lens; PCIOL posterior chamber  intraocular lens; Phaco/IOL phacoemulsification with intraocular lens placement; [place] photorefractive keratectomy; LASIK laser assisted in situ keratomileusis; HTN hypertension; DM diabetes mellitus; COPD chronic obstructive pulmonary disease

## 2018-02-12 ENCOUNTER — Ambulatory Visit (INDEPENDENT_AMBULATORY_CARE_PROVIDER_SITE_OTHER): Payer: Medicare Other | Admitting: Ophthalmology

## 2018-02-12 ENCOUNTER — Encounter (INDEPENDENT_AMBULATORY_CARE_PROVIDER_SITE_OTHER): Payer: Self-pay | Admitting: Ophthalmology

## 2018-02-12 DIAGNOSIS — Z961 Presence of intraocular lens: Secondary | ICD-10-CM

## 2018-02-12 DIAGNOSIS — E113213 Type 2 diabetes mellitus with mild nonproliferative diabetic retinopathy with macular edema, bilateral: Secondary | ICD-10-CM | POA: Diagnosis not present

## 2018-02-12 DIAGNOSIS — H35033 Hypertensive retinopathy, bilateral: Secondary | ICD-10-CM

## 2018-02-12 DIAGNOSIS — I10 Essential (primary) hypertension: Secondary | ICD-10-CM | POA: Diagnosis not present

## 2018-02-12 DIAGNOSIS — H43391 Other vitreous opacities, right eye: Secondary | ICD-10-CM

## 2018-02-12 DIAGNOSIS — H34831 Tributary (branch) retinal vein occlusion, right eye, with macular edema: Secondary | ICD-10-CM | POA: Diagnosis not present

## 2018-02-12 DIAGNOSIS — H3581 Retinal edema: Secondary | ICD-10-CM | POA: Diagnosis not present

## 2018-02-12 DIAGNOSIS — H35371 Puckering of macula, right eye: Secondary | ICD-10-CM

## 2018-02-12 DIAGNOSIS — H4710 Unspecified papilledema: Secondary | ICD-10-CM

## 2018-02-12 MED ORDER — BEVACIZUMAB CHEMO INJECTION 1.25MG/0.05ML SYRINGE FOR KALEIDOSCOPE
1.2500 mg | INTRAVITREAL | Status: DC
  Administered 2018-02-12: 1.25 mg via INTRAVITREAL

## 2018-02-28 ENCOUNTER — Ambulatory Visit (INDEPENDENT_AMBULATORY_CARE_PROVIDER_SITE_OTHER): Payer: Medicare Other | Admitting: Nurse Practitioner

## 2018-02-28 ENCOUNTER — Ambulatory Visit (INDEPENDENT_AMBULATORY_CARE_PROVIDER_SITE_OTHER): Payer: Medicare Other

## 2018-02-28 ENCOUNTER — Encounter (INDEPENDENT_AMBULATORY_CARE_PROVIDER_SITE_OTHER): Payer: Self-pay | Admitting: Nurse Practitioner

## 2018-02-28 ENCOUNTER — Other Ambulatory Visit: Payer: Self-pay

## 2018-02-28 ENCOUNTER — Other Ambulatory Visit (INDEPENDENT_AMBULATORY_CARE_PROVIDER_SITE_OTHER): Payer: Self-pay | Admitting: Nurse Practitioner

## 2018-02-28 VITALS — BP 128/65 | HR 77 | Resp 16 | Ht 65.0 in | Wt 161.6 lb

## 2018-02-28 DIAGNOSIS — I10 Essential (primary) hypertension: Secondary | ICD-10-CM | POA: Diagnosis not present

## 2018-02-28 DIAGNOSIS — I739 Peripheral vascular disease, unspecified: Secondary | ICD-10-CM

## 2018-02-28 DIAGNOSIS — I70249 Atherosclerosis of native arteries of left leg with ulceration of unspecified site: Secondary | ICD-10-CM

## 2018-02-28 DIAGNOSIS — Z87891 Personal history of nicotine dependence: Secondary | ICD-10-CM

## 2018-02-28 DIAGNOSIS — Z95820 Peripheral vascular angioplasty status with implants and grafts: Secondary | ICD-10-CM | POA: Diagnosis not present

## 2018-02-28 DIAGNOSIS — E785 Hyperlipidemia, unspecified: Secondary | ICD-10-CM | POA: Diagnosis not present

## 2018-02-28 NOTE — Progress Notes (Signed)
SUBJECTIVE:  Patient ID: Cheryl Leonard, female    DOB: [birth year], 73 y.o.   MRN: [id number] Chief Complaint  Patient presents with  . Follow-up    ultrasound follow up    HPI  Cheryl Leonard is a 73 y.o. female The patient returns to the office for followup and review of the noninvasive studies. There has been deterioration in the lower extremity symptoms.  The patient notes interval shortening of their claudication distance with inclines becoming more difficult.  The patient still has a left toe wound which is currently being treated by Dr. Orland Jarred.  She states that it is currently healing.  There have been no significant changes to the patient's overall health care.  The patient denies amaurosis fugax or recent TIA symptoms. There are no recent neurological changes noted. The patient denies history of DVT, PE or superficial thrombophlebitis. The patient denies recent episodes of angina or shortness of breath.   ABI's Rt= 0.72 and Lt=1.16 (previous ABI's Rt=0.71 and Lt=1.17).  Her TBI's are 0.65 on the right and 0.94 on the left.  The previous TBI's were 0.55 on the right and 0.82 on the left.  Compared to previous studies on 11/30/2017 the ABIs are essentially unchanged with slight improvement in the TBI's.  Her toe waveforms are strong bilaterally. Duplex US of the lower extremity arterial system shows short segment occlusion in the right mid SFA.  The left lower extremity duplex reveals a patent external iliac artery, SFA stents with increased velocities at the distal end of the stent suggesting a possible 75 to 99% stenosis.  She has triphasic waveform through to the level of the anterior tibial artery.  The posterior tibial artery of the left is biphasic  Past Medical History:  Diagnosis Date  . Anxiety   . Arthritis   . Cataracts, both eyes   . GERD (gastroesophageal reflux disease)   . Gout   . History of fracture of patella    right knee  . History of positive PPD     Patient always shows positive  . Hyperlipidemia   . Hypertension   . Hypothyroidism   . Lichen sclerosus 12/30/2013   of vulva  . Metatarsal fracture   . Neuropathy   . Peripheral vascular disease (HCC)   . Polyneuropathy    numbness and tingling in feet and toes  . Renal insufficiency   . Type 2 diabetes mellitus, uncontrolled (HCC)     Past Surgical History:  Procedure Laterality Date  . APPENDECTOMY    . BREAST REDUCTION SURGERY  2001  . CATARACT EXTRACTION    . CESAREAN SECTION  1976  . COLONOSCOPY  03/05/2013   Nml - due for repeat 03/06/2018  . DILATION AND CURETTAGE OF UTERUS  1989  . ESOPHAGOGASTRODUODENOSCOPY  03/05/2013  . EYE SURGERY    . Eyelid Surgery  2012  . INTRAMEDULLARY (IM) NAIL INTERTROCHANTERIC Left 10/30/2015   Procedure: INTRAMEDULLARY (IM) NAIL INTERTROCHANTRIC ;  Surgeon: Kennedy Bucker, MD;  Location: [facility];  Service: Orthopedics;  Laterality: Left;  . LAPAROSCOPIC HYSTERECTOMY  2000   total  . LOWER EXTREMITY ANGIOGRAPHY Left 03/08/2017   Procedure: LOWER EXTREMITY ANGIOGRAPHY;  Surgeon: Annice Needy, MD;  Location: [facility];  Service: Cardiovascular;  Laterality: Left;  . LOWER EXTREMITY ANGIOGRAPHY Left 10/30/2017   Procedure: LOWER EXTREMITY ANGIOGRAPHY;  Surgeon: Annice Needy, MD;  Location: [facility];  Service: Cardiovascular;  Laterality: Left;  . REDUCTION MAMMAPLASTY  1997    Social History   Socioeconomic History  . Marital status: Married    Spouse name: Not on file  . Number of children: Not on file  . Years of education: Not on file  . Highest education level: Not on file  Occupational History  . Not on file  Social Needs  . Financial resource strain: Not on file  . Food insecurity:    Worry: Not on file    Inability: Not on file  . Transportation needs:    Medical: Not on file    Non-medical: Not on file  Tobacco Use  . Smoking status: Former Smoker    Packs/day: 1.00    Years: 20.00    Pack  years: 20.00    Types: Cigarettes    Last attempt to quit: 03/07/1996    Years since quitting: 21.9  . Smokeless tobacco: Never Used  . Tobacco comment: started smoking at age 50  Substance and Sexual Activity  . Alcohol use: No    Alcohol/week: 0.0 standard drinks  . Drug use: No  . Sexual activity: Yes    Partners: Male    Birth control/protection: Surgical  Lifestyle  . Physical activity:    Days per week: Not on file    Minutes per session: Not on file  . Stress: Not on file  Relationships  . Social connections:    Talks on phone: Not on file    Gets together: Not on file    Attends religious service: Not on file    Active member of club or organization: Not on file    Attends meetings of clubs or organizations: Not on file    Relationship status: Not on file  . Intimate partner violence:    Fear of current or ex partner: Not on file    Emotionally abused: Not on file    Physically abused: Not on file    Forced sexual activity: Not on file  Other Topics Concern  . Not on file  Social History Narrative  . Not on file    Family History  Problem Relation Age of Onset  . Coronary artery disease Father   . Heart attack Father   . Coronary artery disease Mother   . Heart attack Mother   . Ovarian cancer Sister 15       sister had hormonal therapy for IVF txs-which increased risk factor for ovarian cancer  . Breast cancer Neg Hx     No Known Allergies   Review of Systems   Review of Systems: Negative Unless Checked Constitutional: [] Weight loss  [] Fever  [] Chills Cardiac: [] Chest pain   []  Atrial Fibrillation  [] Palpitations   [] Shortness of breath when laying flat   [] Shortness of breath with exertion. [] Shortness of breath at rest Vascular:  [x] Pain in legs with walking   [] Pain in legs with standing [] Pain in legs when laying flat   [x] Claudication    [] Pain in feet when laying flat    [] History of DVT   [] Phlebitis   [] Swelling in legs   [] Varicose veins    [] Non-healing ulcers Pulmonary:   [] Uses home oxygen   [] Productive cough   [] Hemoptysis   [] Wheeze  [] COPD   [] Asthma Neurologic:  [] Dizziness   [] Seizures  [] Blackouts [] History of stroke   [] History of TIA  [] Aphasia   [] Temporary Blindness   [] Weakness or numbness in arm   [x] Weakness or numbness in leg Musculoskeletal:   [] Joint swelling   [] Joint  pain   [] Low back pain  []  History of Knee Replacement [] Arthritis [] back Surgeries  []  Spinal Stenosis    Hematologic:  [] Easy bruising  [] Easy bleeding   [] Hypercoagulable state   [] Anemic Gastrointestinal:  [] Diarrhea   [] Vomiting  [x] Gastroesophageal reflux/heartburn   [] Difficulty swallowing. [] Abdominal pain Genitourinary:  [] Chronic kidney disease   [] Difficult urination  [] Anuric   [] Blood in urine [] Frequent urination  [] Burning with urination   [] Hematuria Skin:  [] Rashes   [] Ulcers [x] Wounds Psychological:  [] History of anxiety   []  History of major depression  []  Memory Difficulties      OBJECTIVE:   Physical Exam  BP 128/65 (BP Location: Right Arm)   Pulse 77   Resp 16   Ht 5\' 5"  (1.651 m)   Wt 161 lb 9.6 oz (73.3 kg)   BMI 26.89 kg/m   Gen: WD/WN, NAD Head: [place]/AT, No temporalis wasting.  Ear/Nose/Throat: Hearing grossly intact, nares w/o erythema or drainage Eyes: PER, EOMI, sclera nonicteric.  Neck: Supple, no masses.  No JVD.  Pulmonary:  Good air movement, no use of accessory muscles.  Cardiac: RRR Vascular:  Vessel Right Left  Radial Palpable Palpable  Dorsalis Pedis Not Palpable Not Palpable  Posterior Tibial Not Palpable Palpable   Gastrointestinal: soft, non-distended. No guarding/no peritoneal signs.  Musculoskeletal: M/S 5/5 throughout.  No deformity or atrophy.  Neurologic: Pain and light touch intact in extremities.  Symmetrical.  Speech is fluent. Motor exam as listed above. Psychiatric: Judgment intact, Mood & affect appropriate for pt's clinical situation. Dermatologic: No Venous rashes. No Ulcers  Noted.  No changes consistent with cellulitis. Lymph : No Cervical lymphadenopathy, no lichenification or skin changes of chronic lymphedema.       ASSESSMENT AND PLAN:  1. PAD (peripheral artery disease) (HCC) ABI's Rt= 0.72 and Lt=1.16 (previous ABI's Rt=0.71 and Lt=1.17).  Her TBI's are 0.65 on the right and 0.94 on the left.  The previous TBI's were 0.55 on the right and 0.82 on the left.  Compared to previous studies on 11/30/2017 the ABIs are essentially unchanged with slight improvement in the TBI's.  Her toe waveforms are strong bilaterally. Duplex US of the lower extremity arterial system shows short segment occlusion in the right mid SFA.  The left lower extremity duplex reveals a patent external iliac artery, SFA stents with increased velocities at the distal end of the stent suggesting a possible 75 to 99% stenosis.  She has triphasic waveform through to the level of the anterior tibial artery.  The posterior tibial artery of the left is biphasic   Recommend:  The patient has experienced increased symptoms and is now describing lifestyle limiting claudication and mild rest pain.   Given the severity of the patient's right lower extremity symptoms the patient should undergo angiography and intervention.  Risk and benefits were reviewed the patient.  Indications for the procedure were reviewed.  All questions were answered, the patient agrees to proceed.   The patient should continue walking and begin a more formal exercise program.  The patient should continue antiplatelet therapy and aggressive treatment of the lipid abnormalities  The patient will follow up with me after the angiogram.   2. Essential hypertension Continue antihypertensive medications as already ordered, these medications have been reviewed and there are no changes at this time.   3. Hyperlipidemia, unspecified hyperlipidemia type Continue statin as ordered and reviewed, no changes at this time    Current  Outpatient Medications on File Prior to Visit  Medication Sig Dispense Refill  . ALPRAZolam (XANAX) 0.25 MG tablet Take 0.25 mg by mouth at bedtime as needed for sleep.     [name] amoxicillin (AMOXIL) 500 MG capsule BEGINNING MORNING OF PROCEDURE TAKE ONE CAPSULE 4 TIMES A DAY UNTIL FINISHED  0  . aspirin EC 81 MG tablet Take 81 mg by mouth daily.     [name] atorvastatin (LIPITOR) 20 MG tablet Take 20 mg by mouth at bedtime.  3  . baclofen (LIORESAL) 10 MG tablet Take 1 tablet (10 mg total) by mouth daily. 7 tablet 0  . buPROPion (WELLBUTRIN XL) 150 MG 24 hr tablet Take 150 mg by mouth daily. Take with 300 mg to equal 450 mg once daily    . buPROPion (WELLBUTRIN XL) 150 MG 24 hr tablet Take by mouth.    [name] buPROPion (WELLBUTRIN XL) 300 MG 24 hr tablet Take 300 mg by mouth daily. Take with 150 mg to equal 450 mg once daily  11  . BYSTOLIC 5 MG tablet Take 5 mg by mouth daily.  2  . chlorhexidine (PERIDEX) 0.12 % solution RINSE FOR 30 SECONDS WITH 0.5 OZ TWICE A DAY  0  . cholecalciferol (VITAMIN D) 1000 units tablet Take 1,000 Units by mouth 2 (two) times daily.    [name] CINNAMON PO Take 1 tablet by mouth daily.    . clopidogrel (PLAVIX) 75 MG tablet Take 1 tablet (75 mg total) by mouth daily. 30 tablet 11  . CORAL CALCIUM PO Take 1 tablet by mouth daily.    [name] denosumab (PROLIA) 60 MG/ML SOLN injection Inject into the skin.    . DENTA 5000 PLUS 1.1 % CREA dental cream 2 (two) times daily. as directed  99  . DULoxetine (CYMBALTA) 60 MG capsule Take 60 mg by mouth 2 (two) times daily.     [name] gabapentin (NEURONTIN) 100 MG capsule TAKE 1 CAPSULE BY MOUTH THREE TIMES A DAY  0  . gemfibrozil (LOPID) 600 MG tablet Take 600 mg by mouth 2 (two) times daily.  3  . Insulin Pen Needle (B-D UF III MINI PEN NEEDLES) 31G X 5 MM MISC once daily.    . Iron-Vitamin C (VITRON-C) 65-125 MG TABS Take 1 tablet by mouth daily.     [name] JANUMET 50-1000 MG per tablet Take 1 tablet by mouth 2 (two) times daily with a meal.  3  .  levothyroxine (SYNTHROID, LEVOTHROID) 100 MCG tablet Take 100 mcg by mouth daily.  3  . lisinopril (PRINIVIL,ZESTRIL) 10 MG tablet Take 10 mg by mouth daily.     . Magnesium 500 MG TABS Take 500 mg by mouth daily.    . Multiple Vitamin (MULTIVITAMIN WITH MINERALS) TABS tablet Take 1 tablet by mouth daily.    . mupirocin ointment (BACTROBAN) 2 % APPLY TO AFFECTED AREA 3 TIMES A DAY  0  . Omega-3 Fatty Acids (FISH OIL) 1000 MG CAPS Take 1,000 mg by mouth daily.    [name] oxyCODONE-acetaminophen (PERCOCET/ROXICET) 5-325 MG tablet Take 1 tablet by mouth every 4 (four) hours as needed.  0  . pantoprazole (PROTONIX) 40 MG tablet Take 40 mg by mouth daily.     . ranitidine (ZANTAC) 300 MG capsule Take 300 mg by mouth at bedtime.  1  . ranitidine (ZANTAC) 300 MG tablet     . TRESIBA FLEXTOUCH 200 UNIT/ML SOPN INJECT 30 UNITS SUB-Q NIGHTLY  5  . vitamin B-12 (CYANOCOBALAMIN) 1000 MCG tablet Take 1,000 mcg by mouth daily.    [name]  vitamin E 400 UNIT capsule Take 400 Units by mouth daily.    [name] zolpidem (AMBIEN) 10 MG tablet Take 10 mg by mouth at bedtime.  5   Current Facility-Administered Medications on File Prior to Visit  Medication Dose Route Frequency Provider Last Rate Last Dose  . aflibercept (EYLEA) SOLN 2 mg  2 mg Intravitreal  [name], MD   2 mg at 06/27/17 1307  . aflibercept (EYLEA) SOLN 2 mg  2 mg Intravitreal  [name], MD   2 mg at 07/26/17 1702  . aflibercept (EYLEA) SOLN 2 mg  2 mg Intravitreal  [name], MD   2 mg at 09/06/17 1700  . aflibercept (EYLEA) SOLN 2 mg  2 mg Intravitreal  [name], MD   2 mg at 11/01/17 1550  . aflibercept (EYLEA) SOLN 2 mg  2 mg Intravitreal  [name], MD   2 mg at 01/03/18 1416  . Bevacizumab (AVASTIN) SOLN 1.25 mg  1.25 mg Intravitreal  [name], MD   1.25 mg at 02/10/17 0953  . Bevacizumab (AVASTIN) SOLN 1.25 mg  1.25 mg Intravitreal  [name], MD   1.25 mg at 03/13/17 1717  . Bevacizumab (AVASTIN) SOLN 1.25 mg  1.25 mg  Intravitreal  [name], MD   1.25 mg at 04/11/17 1626  . Bevacizumab (AVASTIN) SOLN 1.25 mg  1.25 mg Intravitreal  [name], MD   1.25 mg at 02/12/18 2154    There are no Patient Instructions on file for this visit. No follow-ups on file.   Georgiana Spinner, NP  This note was completed with Office manager.  Any errors are purely unintentional.

## 2018-03-01 ENCOUNTER — Encounter (INDEPENDENT_AMBULATORY_CARE_PROVIDER_SITE_OTHER): Payer: Self-pay

## 2018-03-07 ENCOUNTER — Other Ambulatory Visit (INDEPENDENT_AMBULATORY_CARE_PROVIDER_SITE_OTHER): Payer: Self-pay | Admitting: Nurse Practitioner

## 2018-03-07 ENCOUNTER — Encounter
Admission: RE | Admit: 2018-03-07 | Discharge: 2018-03-07 | Disposition: A | Discharge status: Home / Self Care | Payer: Medicare Other | Source: Ambulatory Visit | Attending: Vascular Surgery | Admitting: Vascular Surgery

## 2018-03-07 ENCOUNTER — Other Ambulatory Visit: Payer: Self-pay

## 2018-03-07 DIAGNOSIS — Z01812 Encounter for preprocedural laboratory examination: Secondary | ICD-10-CM | POA: Insufficient documentation

## 2018-03-07 LAB — BUN: BUN: 24 mg/dL — ABNORMAL HIGH (ref 8–23)

## 2018-03-07 LAB — CREATININE, SERUM
CREATININE: 1.25 mg/dL — AB (ref 0.44–1.00)
GFR calc Af Amer: 50 mL/min — ABNORMAL LOW (ref >60)
GFR calc non Af Amer: 43 mL/min — ABNORMAL LOW (ref >60)

## 2018-03-07 MED ORDER — CEFAZOLIN SODIUM-DEXTROSE 2-4 GM/100ML-% IV SOLN
2.0000 g | Freq: Once | INTRAVENOUS | Status: AC
  Administered 2018-03-08: 2 g via INTRAVENOUS

## 2018-03-08 ENCOUNTER — Other Ambulatory Visit: Payer: Self-pay

## 2018-03-08 ENCOUNTER — Encounter
Admission: RE | Disposition: A | Discharge status: Home / Self Care | Payer: Self-pay | Source: Ambulatory Visit | Attending: Surgery

## 2018-03-08 ENCOUNTER — Inpatient Hospital Stay
Admission: RE | Admit: 2018-03-08 | Discharge: 2018-03-12 | DRG: 271 | Disposition: A | Discharge status: Home / Self Care | Payer: Medicare Other | Source: Ambulatory Visit | Attending: Vascular Surgery | Admitting: Vascular Surgery

## 2018-03-08 ENCOUNTER — Other Ambulatory Visit (INDEPENDENT_AMBULATORY_CARE_PROVIDER_SITE_OTHER): Payer: Self-pay | Admitting: Vascular Surgery

## 2018-03-08 DIAGNOSIS — R710 Precipitous drop in hematocrit: Secondary | ICD-10-CM | POA: Diagnosis not present

## 2018-03-08 DIAGNOSIS — M109 Gout, unspecified: Secondary | ICD-10-CM | POA: Diagnosis present

## 2018-03-08 DIAGNOSIS — I70248 Atherosclerosis of native arteries of left leg with ulceration of other part of lower left leg: Secondary | ICD-10-CM | POA: Diagnosis present

## 2018-03-08 DIAGNOSIS — I70221 Atherosclerosis of native arteries of extremities with rest pain, right leg: Secondary | ICD-10-CM | POA: Diagnosis present

## 2018-03-08 DIAGNOSIS — I70211 Atherosclerosis of native arteries of extremities with intermittent claudication, right leg: Secondary | ICD-10-CM

## 2018-03-08 DIAGNOSIS — K219 Gastro-esophageal reflux disease without esophagitis: Secondary | ICD-10-CM | POA: Diagnosis present

## 2018-03-08 DIAGNOSIS — E11621 Type 2 diabetes mellitus with foot ulcer: Secondary | ICD-10-CM | POA: Diagnosis present

## 2018-03-08 DIAGNOSIS — Z79890 Hormone replacement therapy (postmenopausal): Secondary | ICD-10-CM

## 2018-03-08 DIAGNOSIS — E1151 Type 2 diabetes mellitus with diabetic peripheral angiopathy without gangrene: Principal | ICD-10-CM | POA: Diagnosis present

## 2018-03-08 DIAGNOSIS — Z87891 Personal history of nicotine dependence: Secondary | ICD-10-CM

## 2018-03-08 DIAGNOSIS — Z7982 Long term (current) use of aspirin: Secondary | ICD-10-CM

## 2018-03-08 DIAGNOSIS — M1990 Unspecified osteoarthritis, unspecified site: Secondary | ICD-10-CM | POA: Diagnosis present

## 2018-03-08 DIAGNOSIS — E785 Hyperlipidemia, unspecified: Secondary | ICD-10-CM | POA: Diagnosis present

## 2018-03-08 DIAGNOSIS — I10 Essential (primary) hypertension: Secondary | ICD-10-CM | POA: Diagnosis present

## 2018-03-08 DIAGNOSIS — I739 Peripheral vascular disease, unspecified: Secondary | ICD-10-CM | POA: Diagnosis present

## 2018-03-08 DIAGNOSIS — Z7902 Long term (current) use of antithrombotics/antiplatelets: Secondary | ICD-10-CM

## 2018-03-08 DIAGNOSIS — I70219 Atherosclerosis of native arteries of extremities with intermittent claudication, unspecified extremity: Secondary | ICD-10-CM | POA: Diagnosis present

## 2018-03-08 HISTORY — PX: PERIPHERAL VASCULAR INTERVENTION: CATH118257

## 2018-03-08 HISTORY — PX: LOWER EXTREMITY ANGIOGRAPHY: CATH118251

## 2018-03-08 LAB — GLUCOSE, CAPILLARY
GLUCOSE-CAPILLARY: 78 mg/dL (ref 70–99)
Glucose-Capillary: 138 mg/dL — ABNORMAL HIGH (ref 70–99)

## 2018-03-08 SURGERY — LOWER EXTREMITY ANGIOGRAPHY
Anesthesia: Moderate Sedation | Site: Leg Lower | Laterality: Right

## 2018-03-08 MED ORDER — FLEET ENEMA 7-19 GM/118ML RE ENEM: 1.0000 | ENEMA | Freq: Once | RECTAL | Status: DC | PRN

## 2018-03-08 MED ORDER — IOHEXOL 300 MG/ML  SOLN
INTRAMUSCULAR | Status: DC | PRN
  Administered 2018-03-08: 105 mL via INTRA_ARTERIAL

## 2018-03-08 MED ORDER — OMEGA-3-ACID ETHYL ESTERS 1 G PO CAPS
1.0000 g | ORAL_CAPSULE | Freq: Every day | ORAL | Status: DC
  Administered 2018-03-09 – 2018-03-11 (×3): 1 g via ORAL
  Filled 2018-03-08 (×4): qty 1

## 2018-03-08 MED ORDER — VITAMIN B-12 1000 MCG PO TABS
1000.0000 ug | ORAL_TABLET | Freq: Every day | ORAL | Status: DC
  Administered 2018-03-09 – 2018-03-12 (×4): 1000 ug via ORAL
  Filled 2018-03-08 (×4): qty 1

## 2018-03-08 MED ORDER — ASPIRIN EC 81 MG PO TBEC
81.0000 mg | DELAYED_RELEASE_TABLET | Freq: Every day | ORAL | Status: DC
  Administered 2018-03-09 – 2018-03-12 (×3): 81 mg via ORAL
  Filled 2018-03-08 (×3): qty 1

## 2018-03-08 MED ORDER — FAMOTIDINE 20 MG PO TABS: 40.0000 mg | ORAL_TABLET | Freq: Once | ORAL | Status: DC | PRN

## 2018-03-08 MED ORDER — SITAGLIPTIN PHOS-METFORMIN HCL 50-1000 MG PO TABS: 1.0000 | ORAL_TABLET | Freq: Two times a day (BID) | ORAL | Status: DC

## 2018-03-08 MED ORDER — ONDANSETRON HCL 4 MG/2ML IJ SOLN
4.0000 mg | Freq: Four times a day (QID) | INTRAMUSCULAR | Status: DC | PRN
  Administered 2018-03-08: 4 mg via INTRAVENOUS
  Filled 2018-03-08: qty 2

## 2018-03-08 MED ORDER — DENOSUMAB 60 MG/ML ~~LOC~~ SOSY: 60.0000 mg | PREFILLED_SYRINGE | SUBCUTANEOUS | Status: DC

## 2018-03-08 MED ORDER — ACETAMINOPHEN 650 MG RE SUPP: 650.0000 mg | Freq: Four times a day (QID) | RECTAL | Status: DC | PRN

## 2018-03-08 MED ORDER — MIDAZOLAM HCL 2 MG/2ML IJ SOLN
INTRAMUSCULAR | Status: DC | PRN
  Administered 2018-03-08 (×3): 1 mg via INTRAVENOUS
  Administered 2018-03-08: 2 mg via INTRAVENOUS

## 2018-03-08 MED ORDER — BUPROPION HCL ER (XL) 150 MG PO TB24
300.0000 mg | ORAL_TABLET | Freq: Every day | ORAL | Status: DC
  Administered 2018-03-10 – 2018-03-12 (×3): 300 mg via ORAL
  Filled 2018-03-08 (×3): qty 2

## 2018-03-08 MED ORDER — DIPHENHYDRAMINE HCL 50 MG/ML IJ SOLN: 50.0000 mg | Freq: Once | INTRAMUSCULAR | Status: DC | PRN

## 2018-03-08 MED ORDER — IRON-VITAMIN C 65-125 MG PO TABS: 1.0000 | ORAL_TABLET | Freq: Every day | ORAL | Status: DC

## 2018-03-08 MED ORDER — HYDROCODONE-ACETAMINOPHEN 5-325 MG PO TABS
1.0000 | ORAL_TABLET | ORAL | Status: DC | PRN
  Administered 2018-03-09: 2 via ORAL
  Administered 2018-03-09: 1 via ORAL
  Administered 2018-03-10 – 2018-03-11 (×7): 2 via ORAL
  Administered 2018-03-11: 1 via ORAL
  Filled 2018-03-08 (×2): qty 2
  Filled 2018-03-08: qty 1
  Filled 2018-03-08 (×6): qty 2
  Filled 2018-03-08: qty 1
  Filled 2018-03-08: qty 2

## 2018-03-08 MED ORDER — VITAMIN E 180 MG (400 UNIT) PO CAPS
400.0000 [IU] | ORAL_CAPSULE | Freq: Every day | ORAL | Status: DC
  Administered 2018-03-09 – 2018-03-12 (×4): 400 [IU] via ORAL
  Filled 2018-03-08 (×5): qty 1

## 2018-03-08 MED ORDER — DOCUSATE SODIUM 100 MG PO CAPS
100.0000 mg | ORAL_CAPSULE | Freq: Two times a day (BID) | ORAL | Status: DC
  Administered 2018-03-08 – 2018-03-12 (×8): 100 mg via ORAL
  Filled 2018-03-08 (×8): qty 1

## 2018-03-08 MED ORDER — ALPRAZOLAM 0.25 MG PO TABS: 0.2500 mg | ORAL_TABLET | Freq: Every evening | ORAL | Status: DC | PRN

## 2018-03-08 MED ORDER — ACETAMINOPHEN 325 MG PO TABS
650.0000 mg | ORAL_TABLET | Freq: Four times a day (QID) | ORAL | Status: DC | PRN
  Administered 2018-03-08 – 2018-03-09 (×2): 650 mg via ORAL
  Filled 2018-03-08 (×2): qty 2

## 2018-03-08 MED ORDER — INSULIN ASPART 100 UNIT/ML ~~LOC~~ SOLN: 0.0000 [IU] | Freq: Every day | SUBCUTANEOUS | Status: DC

## 2018-03-08 MED ORDER — SODIUM CHLORIDE 0.9 % IV SOLN
INTRAVENOUS | Status: DC
  Administered 2018-03-08: 07:00:00 via INTRAVENOUS

## 2018-03-08 MED ORDER — HYDROMORPHONE HCL 1 MG/ML IJ SOLN
INTRAMUSCULAR | Status: AC
  Filled 2018-03-08: qty 1

## 2018-03-08 MED ORDER — FENTANYL CITRATE (PF) 100 MCG/2ML IJ SOLN
INTRAMUSCULAR | Status: DC | PRN
  Administered 2018-03-08: 50 ug via INTRAVENOUS
  Administered 2018-03-08 (×2): 25 ug via INTRAVENOUS

## 2018-03-08 MED ORDER — ADULT MULTIVITAMIN W/MINERALS CH
1.0000 | ORAL_TABLET | Freq: Every day | ORAL | Status: DC
  Administered 2018-03-09 – 2018-03-12 (×4): 1 via ORAL
  Filled 2018-03-08 (×4): qty 1

## 2018-03-08 MED ORDER — ZOLPIDEM TARTRATE 5 MG PO TABS
5.0000 mg | ORAL_TABLET | Freq: Every day | ORAL | Status: DC
  Administered 2018-03-08 – 2018-03-11 (×4): 5 mg via ORAL
  Filled 2018-03-08 (×4): qty 1

## 2018-03-08 MED ORDER — HEPARIN SODIUM (PORCINE) 1000 UNIT/ML IJ SOLN
INTRAMUSCULAR | Status: AC
  Filled 2018-03-08: qty 1

## 2018-03-08 MED ORDER — METHYLPREDNISOLONE SODIUM SUCC 125 MG IJ SOLR: 125.0000 mg | Freq: Once | INTRAMUSCULAR | Status: DC | PRN

## 2018-03-08 MED ORDER — MIDAZOLAM HCL 2 MG/ML PO SYRP: 8.0000 mg | ORAL_SOLUTION | Freq: Once | ORAL | Status: DC | PRN

## 2018-03-08 MED ORDER — SORBITOL 70 % SOLN
30.0000 mL | Freq: Every day | Status: DC | PRN
  Filled 2018-03-08: qty 30

## 2018-03-08 MED ORDER — METFORMIN HCL 500 MG PO TABS
1000.0000 mg | ORAL_TABLET | Freq: Every day | ORAL | Status: DC
  Administered 2018-03-08 – 2018-03-11 (×3): 1000 mg via ORAL
  Filled 2018-03-08 (×3): qty 2

## 2018-03-08 MED ORDER — DEXTROSE-NACL 5-0.9 % IV SOLN
INTRAVENOUS | Status: DC
  Administered 2018-03-08: 14:00:00 via INTRAVENOUS

## 2018-03-08 MED ORDER — FENTANYL CITRATE (PF) 100 MCG/2ML IJ SOLN
INTRAMUSCULAR | Status: AC
  Filled 2018-03-08: qty 2

## 2018-03-08 MED ORDER — GABAPENTIN 300 MG PO CAPS: 300.0000 mg | ORAL_CAPSULE | Freq: Three times a day (TID) | ORAL | Status: DC

## 2018-03-08 MED ORDER — INSULIN DETEMIR 100 UNIT/ML ~~LOC~~ SOLN
30.0000 [IU] | Freq: Every evening | SUBCUTANEOUS | Status: DC
  Administered 2018-03-09 – 2018-03-11 (×3): 30 [IU] via SUBCUTANEOUS
  Filled 2018-03-08 (×5): qty 0.3

## 2018-03-08 MED ORDER — CEFAZOLIN SODIUM-DEXTROSE 2-4 GM/100ML-% IV SOLN
2.0000 g | Freq: Once | INTRAVENOUS | Status: DC
  Filled 2018-03-08: qty 100

## 2018-03-08 MED ORDER — BUPROPION HCL ER (XL) 150 MG PO TB24
150.0000 mg | ORAL_TABLET | Freq: Every day | ORAL | Status: DC
  Administered 2018-03-10 – 2018-03-12 (×3): 150 mg via ORAL
  Filled 2018-03-08 (×3): qty 1

## 2018-03-08 MED ORDER — NEBIVOLOL HCL 5 MG PO TABS
5.0000 mg | ORAL_TABLET | Freq: Every day | ORAL | Status: DC
  Administered 2018-03-09 – 2018-03-12 (×3): 5 mg via ORAL
  Filled 2018-03-08 (×5): qty 1

## 2018-03-08 MED ORDER — LEVOTHYROXINE SODIUM 100 MCG PO TABS
100.0000 ug | ORAL_TABLET | Freq: Every day | ORAL | Status: DC
  Administered 2018-03-09 – 2018-03-12 (×4): 100 ug via ORAL
  Filled 2018-03-08 (×4): qty 1

## 2018-03-08 MED ORDER — HYDROMORPHONE HCL 1 MG/ML IJ SOLN
1.0000 mg | Freq: Once | INTRAMUSCULAR | Status: AC | PRN
  Administered 2018-03-08: 1 mg via INTRAVENOUS

## 2018-03-08 MED ORDER — HEPARIN SODIUM (PORCINE) 1000 UNIT/ML IJ SOLN
INTRAMUSCULAR | Status: DC | PRN
  Administered 2018-03-08: 5000 [IU] via INTRAVENOUS

## 2018-03-08 MED ORDER — MIDAZOLAM HCL 5 MG/5ML IJ SOLN
INTRAMUSCULAR | Status: AC
  Filled 2018-03-08: qty 5

## 2018-03-08 MED ORDER — LISINOPRIL 10 MG PO TABS
10.0000 mg | ORAL_TABLET | Freq: Every day | ORAL | Status: DC
  Administered 2018-03-10 – 2018-03-12 (×2): 10 mg via ORAL
  Filled 2018-03-08 (×2): qty 1

## 2018-03-08 MED ORDER — VITAMIN D 25 MCG (1000 UNIT) PO TABS
1000.0000 [IU] | ORAL_TABLET | Freq: Two times a day (BID) | ORAL | Status: DC
  Administered 2018-03-08 – 2018-03-12 (×8): 1000 [IU] via ORAL
  Filled 2018-03-08 (×8): qty 1

## 2018-03-08 MED ORDER — BACLOFEN 10 MG PO TABS
10.0000 mg | ORAL_TABLET | Freq: Every day | ORAL | Status: DC
  Filled 2018-03-08: qty 1

## 2018-03-08 MED ORDER — ONDANSETRON HCL 4 MG PO TABS: 4.0000 mg | ORAL_TABLET | Freq: Four times a day (QID) | ORAL | Status: DC | PRN

## 2018-03-08 MED ORDER — MAGNESIUM HYDROXIDE 400 MG/5ML PO SUSP
30.0000 mL | Freq: Every day | ORAL | Status: DC | PRN
  Filled 2018-03-08: qty 30

## 2018-03-08 MED ORDER — SODIUM CHLORIDE 0.9 % IV SOLN
INTRAVENOUS | Status: DC
  Administered 2018-03-08 – 2018-03-09 (×4): via INTRAVENOUS

## 2018-03-08 MED ORDER — INSULIN DETEMIR 100 UNIT/ML ~~LOC~~ SOLN
30.0000 [IU] | Freq: Every day | SUBCUTANEOUS | Status: DC
  Filled 2018-03-08: qty 0.3

## 2018-03-08 MED ORDER — METFORMIN HCL 500 MG PO TABS: 1000.0000 mg | ORAL_TABLET | Freq: Every day | ORAL | Status: DC

## 2018-03-08 MED ORDER — LIDOCAINE-EPINEPHRINE (PF) 1 %-1:200000 IJ SOLN
INTRAMUSCULAR | Status: DC | PRN
  Administered 2018-03-08: 10 mL

## 2018-03-08 MED ORDER — CLOPIDOGREL BISULFATE 75 MG PO TABS
75.0000 mg | ORAL_TABLET | Freq: Every day | ORAL | Status: DC
  Administered 2018-03-10 – 2018-03-12 (×2): 75 mg via ORAL
  Filled 2018-03-08 (×2): qty 1

## 2018-03-08 MED ORDER — OXYCODONE-ACETAMINOPHEN 5-325 MG PO TABS: 1.0000 | ORAL_TABLET | ORAL | Status: DC | PRN

## 2018-03-08 MED ORDER — ATORVASTATIN CALCIUM 20 MG PO TABS
20.0000 mg | ORAL_TABLET | Freq: Every day | ORAL | Status: DC
  Administered 2018-03-08 – 2018-03-11 (×4): 20 mg via ORAL
  Filled 2018-03-08 (×4): qty 1

## 2018-03-08 MED ORDER — GEMFIBROZIL 600 MG PO TABS
600.0000 mg | ORAL_TABLET | Freq: Two times a day (BID) | ORAL | Status: DC
  Administered 2018-03-08 – 2018-03-12 (×7): 600 mg via ORAL
  Filled 2018-03-08 (×9): qty 1

## 2018-03-08 MED ORDER — DULOXETINE HCL 30 MG PO CPEP
60.0000 mg | ORAL_CAPSULE | Freq: Two times a day (BID) | ORAL | Status: DC
  Administered 2018-03-08 – 2018-03-12 (×7): 60 mg via ORAL
  Filled 2018-03-08 (×7): qty 2

## 2018-03-08 MED ORDER — PANTOPRAZOLE SODIUM 40 MG PO TBEC
40.0000 mg | DELAYED_RELEASE_TABLET | Freq: Every day | ORAL | Status: DC
  Administered 2018-03-09 – 2018-03-12 (×4): 40 mg via ORAL
  Filled 2018-03-08 (×4): qty 1

## 2018-03-08 MED ORDER — INSULIN ASPART 100 UNIT/ML ~~LOC~~ SOLN: 0.0000 [IU] | Freq: Three times a day (TID) | SUBCUTANEOUS | Status: DC

## 2018-03-08 MED ORDER — FAMOTIDINE 20 MG PO TABS
20.0000 mg | ORAL_TABLET | Freq: Every day | ORAL | Status: DC
  Administered 2018-03-09 – 2018-03-12 (×4): 20 mg via ORAL
  Filled 2018-03-08 (×4): qty 1

## 2018-03-08 MED ORDER — LINAGLIPTIN 5 MG PO TABS: 5.0000 mg | ORAL_TABLET | Freq: Every day | ORAL | Status: DC

## 2018-03-08 MED ORDER — ONDANSETRON HCL 4 MG/2ML IJ SOLN
4.0000 mg | Freq: Four times a day (QID) | INTRAMUSCULAR | Status: DC | PRN
  Administered 2018-03-09: 4 mg via INTRAVENOUS

## 2018-03-08 SURGICAL SUPPLY — 24 items
BALLN LUTONIX 018 6X60X130 (BALLOONS) ×4
BALLN LUTONIX 5X220X130 (BALLOONS) ×8
BALLN ULTRVRSE 3X80X150 (BALLOONS) ×2
BALLN ULTRVRSE 3X80X150 OTW (BALLOONS) ×2
BALLN ULTRVRSE 5X300X150 (BALLOONS) ×4
BALLOON LUTONIX 018 6X60X130 (BALLOONS) ×2 IMPLANT
BALLOON LUTONIX 5X220X130 (BALLOONS) ×4 IMPLANT
BALLOON ULTRVRSE 3X80X150 OTW (BALLOONS) ×2 IMPLANT
BALLOON ULTRVRSE 5X300X150 (BALLOONS) ×2 IMPLANT
CATH BEACON 5 .038 100 VERT TP (CATHETERS) ×4 IMPLANT
CATH PIG 70CM (CATHETERS) ×4 IMPLANT
DEVICE PRESTO INFLATION (MISCELLANEOUS) ×4 IMPLANT
DEVICE SAFEGUARD 24CM (GAUZE/BANDAGES/DRESSINGS) ×4 IMPLANT
DEVICE STARCLOSE SE CLOSURE (Vascular Products) ×4 IMPLANT
DEVICE TORQUE .014-.018 (MISCELLANEOUS) ×2 IMPLANT
GLIDEWIRE ADV .035X260CM (WIRE) ×4 IMPLANT
PACK ANGIOGRAPHY (CUSTOM PROCEDURE TRAY) ×4 IMPLANT
SHEATH ANL2 6FRX45 HC (SHEATH) ×8 IMPLANT
SHEATH BRITE TIP 5FRX11 (SHEATH) ×4 IMPLANT
STENT VIABAHN 6X250X120 (Permanent Stent) ×4 IMPLANT
TORQUE DEVICE .014-.018 (MISCELLANEOUS) ×4
TUBING CONTRAST HIGH PRESS 72 (TUBING) ×4 IMPLANT
WIRE G V18X300CM (WIRE) ×4 IMPLANT
WIRE J 3MM .035X145CM (WIRE) ×4 IMPLANT

## 2018-03-08 NOTE — H&P (Signed)
[facility] History & Physical Update  The patient was interviewed and re-examined.  The patient's previous History and Physical has been reviewed and is unchanged.  There is no change in the plan of care. We plan to proceed with the scheduled procedure.  Festus Barren, MD  03/08/2018, 8:09 AM

## 2018-03-08 NOTE — Progress Notes (Signed)
MD called for clarification of nursing assessment of PAD in Pt left groin. Left posterior tibial pulse present and left dorsalis pulse absent with doppler. MD made aware of findings. Nurse will continue to monitor.

## 2018-03-08 NOTE — Anesthesia Preprocedure Evaluation (Addendum)
Anesthesia Evaluation  Patient identified by MRN, date of birth, ID band Patient awake    Reviewed: Allergy & Precautions, NPO status , Patient's Chart, lab work & pertinent test results  History of Anesthesia Complications Negative for: history of anesthetic complications  Airway Mallampati: II  TM Distance: >3 FB Neck ROM: Full    Dental  (+) Upper Dentures, Lower Dentures   Pulmonary neg sleep apnea, neg COPD, former smoker,    breath sounds clear to auscultation- rhonchi (-) wheezing      Cardiovascular hypertension, Pt. on medications + Peripheral Vascular Disease  (-) CAD, (-) Past MI, (-) Cardiac Stents and (-) CABG  Rhythm:Regular Rate:Normal - Systolic murmurs and - Diastolic murmurs    Neuro/Psych neg Seizures Anxiety negative neurological ROS     GI/Hepatic Neg liver ROS, GERD  ,  Endo/Other  diabetes, Insulin DependentHypothyroidism   Renal/GU Renal InsufficiencyRenal disease     Musculoskeletal  (+) Arthritis ,   Abdominal (+) - obese,   Peds  Hematology  (+) anemia ,   Anesthesia Other Findings Past Medical History: No date: Anxiety No date: Arthritis No date: Cataracts, both eyes No date: GERD (gastroesophageal reflux disease) No date: Gout No date: History of fracture of patella     Comment:  right knee No date: History of positive PPD     Comment:  Patient always shows positive No date: Hyperlipidemia No date: Hypertension No date: Hypothyroidism 12/30/2013: Lichen sclerosus     Comment:  of vulva No date: Metatarsal fracture No date: Neuropathy No date: Peripheral vascular disease (HCC) No date: Polyneuropathy     Comment:  numbness and tingling in feet and toes No date: Renal insufficiency No date: Type 2 diabetes mellitus, uncontrolled (HCC)   Reproductive/Obstetrics                            Anesthesia Physical Anesthesia Plan  ASA: III  Anesthesia  Plan: General   Post-op Pain Management:    Induction: Intravenous  PONV Risk Score and Plan: 2 and Ondansetron, Dexamethasone and Midazolam  Airway Management Planned: Oral ETT  Additional Equipment:   Intra-op Plan:   Post-operative Plan: Extubation in OR  Informed Consent: I have reviewed the patients History and Physical, chart, labs and discussed the procedure including the risks, benefits and alternatives for the proposed anesthesia with the patient or authorized representative who has indicated his/her understanding and acceptance.     Dental advisory given  Plan Discussed with: CRNA and Anesthesiologist  Anesthesia Plan Comments:         Anesthesia Quick Evaluation

## 2018-03-08 NOTE — Op Note (Signed)
[facility]  Percutaneous Study/Intervention Procedural Note   Date of Surgery: 03/08/2018  Surgeon(s):,    Assistants:none  Pre-operative Diagnosis: PAD with claudication right lower extremity  Post-operative diagnosis:  Same  Procedure(s) Performed:             1.  Ultrasound guidance for vascular access left femoral artery             2.  Catheter placement into right common femoral artery from left femoral approach             3.  Aortogram and selective right lower extremity angiogram             4.  Percutaneous transluminal angioplasty of the right proximal posterior tibial artery with 3 mm diameter by 8 cm angioplasty balloon             5.   Percutaneous transluminal angioplasty of the entire right SFA and above-knee popliteal artery with two 5 mm diameter by 22 cm length Lutonix drug-coated angioplasty balloons  6.  Viabahn stent placement of the right SFA with 6 mm diameter by 25 cm length stent for greater than 50% residual stenosis after angioplasty  7.  Percutaneous transluminal angioplasty of the right common femoral artery with 6 mm diameter by 6 cm length Lutonix drug-coated angioplasty balloon             8.  StarClose closure device left femoral artery  EBL: 30 cc  Contrast: 105 cc  Fluoro Time: 12.5 minutes  Moderate Conscious Sedation Time: approximately 60 minutes using 5 mg of Versed and 100 Mcg of Fentanyl              Indications:  Patient is a 73 y.o.female with disabling claudication of the right leg. The patient has noninvasive study showing moderate reduction in the right ABI. The patient is brought in for angiography for further evaluation and potential treatment. Risks and benefits are discussed and informed consent is obtained.   Procedure:  The patient was identified and appropriate procedural time out was performed.  The patient was then placed supine on the table and prepped and draped in the usual sterile fashion.  Moderate conscious sedation was administered during a face to face encounter with the patient throughout the procedure with my supervision of the RN administering medicines and monitoring the patient's vital signs, pulse oximetry, telemetry and mental status throughout from the start of the procedure until the patient was taken to the recovery room. Ultrasound was used to evaluate the left common femoral artery.  It was patent but there appeared to be disease at the bifurcation.  A digital ultrasound image was acquired.  A Seldinger needle was used to access the left common femoral artery under direct ultrasound guidance and a permanent image was performed.  A 0.035 J wire was advanced without resistance and a 5Fr sheath was placed.  Pigtail catheter was placed into the aorta and an AP aortogram was performed. This demonstrated normal renal arteries and normal aorta and iliac segments without significant stenosis although there was borderline 50% stenosis in the left common iliac artery. I then crossed the aortic bifurcation and advanced to the right femoral head. Selective right lower extremity angiogram was then performed. This demonstrated some disease in the common femoral artery and occlusion of the proximal superficial femoral artery a few centimeters beyond its origin with reconstitution of the above-knee popliteal artery.  There was then a true tibial trifurcation with  a moderate stenosis of the proximal right posterior tibial artery in the 60 to 70% range.  There is also a moderate stenosis in the peroneal artery in the 70% range.  The anterior tibial artery was continuous without focal stenosis of greater than 50%.  Both the posterior and anterior tibial arteries were continuous into the foot. It was felt that it was in the patient's best interest to proceed with intervention after these images to avoid a second procedure and a larger amount of contrast and fluoroscopy based off of the findings from the  initial angiogram. The patient was systemically heparinized and a 6 [place] Ansell sheath was then placed over the [employer] wire. I then used a Kumpe catheter and the advantage wire to navigate through the common femoral disease, the SFA and popliteal disease and confirm intraluminal flow in the popliteal artery below the knee.  I then exchanged for a 0.018 wire and cross the stenosis in the proximal right posterior tibial artery with help of the Kumpe catheter without difficulty.  I then proceeded with treatment.  3 mm diameter by 8 cm length angioplasty balloon was inflated to 12 atm for 1 minute in the right posterior tibial artery with less than 20% residual stenosis remaining after angioplasty.  I then used a pair of 5 mm diameter by 22 cm length Lutonix drug-coated angioplasty balloons to inflate from just above the knee up to the SFA origin.  The first inflation was 8 atm for 1 minute and the second inflation was 10 atm for 1 minute approximately.  Completion imaging showed the proximal and mid superficial femoral arteries to have multiple areas of greater than 50% stenosis.  I elected to place a Viabahn stent.  This was 6 mm in diameter by 25 cm in length and on initial attempt at deployment, the stent would not deploy beyond the distal tip of the stent.  The sheath was taken down over the stent and constrained in the stent and sheath were then removed with a new 6 French sheath placed over the V 18 wire.  I then deployed the 6 mm diameter by 25 cm length via bond stent from the origin of the SFA down to the mid to distal SFA and postdilated this with a 5 mm balloon.  For the residual disease in the common femoral artery a 6 mm diameter by 6 cm length Lutonix drug-coated angioplasty balloon was inflated to 12 atm for 1 minute.  Completion imaging demonstrated no greater than 10% residual stenosis in the SFA and popliteal arteries after these interventions.  There was mild to moderate residual right  common femoral disease approaching 50% but this was in a poor location for a stent and there appeared to be much more brisk flow distally at this point so this was left for now.  Any further intervention here would require femoral endarterectomy. I elected to terminate the procedure. The sheath was removed and StarClose closure device was deployed in the left femoral artery pressure was held on the left femoral artery for 15 minutes with excellent hemostatic result.  The extra pressure was held as we had to have the sheath removal and the patient developed some hematoma during the procedure but with pressure in the StarClose no further bleeding was seen.  The patient was taken to the recovery room in stable condition having tolerated the procedure well.  Findings:               Aortogram:  Normal  renal arteries, aorta and iliac arteries with some mild stenosis at the origin of the left common iliac artery but no hemodynamically significant stenosis throughout these vessels.             Right lower Extremity:  This demonstrated some disease in the common femoral artery and occlusion of the proximal superficial femoral artery a few centimeters beyond its origin with reconstitution of the above-knee popliteal artery.  There was then a true tibial trifurcation with a moderate stenosis of the proximal right posterior tibial artery in the 60 to 70% range.  There is also a moderate stenosis in the peroneal artery in the 70% range.  The anterior tibial artery was continuous without focal stenosis of greater than 50%.  Both the posterior and anterior tibial arteries were continuous into the foot   Disposition: Patient was taken to the recovery room in stable condition having tolerated the procedure well.  Complications: None  Leotis Pain 03/08/2018 9:33 AM   This note was created with Dragon Medical transcription system. Any errors in dictation are purely unintentional.

## 2018-03-09 ENCOUNTER — Encounter: Payer: Self-pay | Admitting: *Deleted

## 2018-03-09 ENCOUNTER — Other Ambulatory Visit: Payer: Self-pay

## 2018-03-09 ENCOUNTER — Encounter
Admission: RE | Disposition: A | Discharge status: Home / Self Care | Payer: Self-pay | Source: Ambulatory Visit | Attending: Surgery

## 2018-03-09 ENCOUNTER — Observation Stay: Payer: Medicare Other | Admitting: Anesthesiology

## 2018-03-09 ENCOUNTER — Other Ambulatory Visit: Payer: Self-pay | Admitting: Surgery

## 2018-03-09 DIAGNOSIS — I70249 Atherosclerosis of native arteries of left leg with ulceration of unspecified site: Secondary | ICD-10-CM | POA: Diagnosis not present

## 2018-03-09 DIAGNOSIS — L97929 Non-pressure chronic ulcer of unspecified part of left lower leg with unspecified severity: Secondary | ICD-10-CM

## 2018-03-09 DIAGNOSIS — E1151 Type 2 diabetes mellitus with diabetic peripheral angiopathy without gangrene: Secondary | ICD-10-CM

## 2018-03-09 DIAGNOSIS — I70211 Atherosclerosis of native arteries of extremities with intermittent claudication, right leg: Secondary | ICD-10-CM

## 2018-03-09 DIAGNOSIS — I10 Essential (primary) hypertension: Secondary | ICD-10-CM

## 2018-03-09 HISTORY — PX: ENDARTERECTOMY POPLITEAL: SHX5806

## 2018-03-09 HISTORY — PX: ENDARTERECTOMY FEMORAL: SHX5804

## 2018-03-09 LAB — CBC
HCT: 30.3 % — ABNORMAL LOW (ref 36.0–46.0)
HCT: 27 % — ABNORMAL LOW (ref 36.0–46.0)
HEMOGLOBIN: 8.6 g/dL — AB (ref 12.0–15.0)
Hemoglobin: 10 g/dL — ABNORMAL LOW (ref 12.0–15.0)
MCH: 29.3 pg (ref 26.0–34.0)
MCH: 29.5 pg (ref 26.0–34.0)
MCHC: 33 g/dL (ref 30.0–36.0)
MCHC: 31.9 g/dL (ref 30.0–36.0)
MCV: 88.9 fL (ref 80.0–100.0)
MCV: 92.5 fL (ref 80.0–100.0)
Platelets: 279 10*3/uL (ref 150–400)
Platelets: 350 10*3/uL (ref 150–400)
RBC: 3.41 MIL/uL — AB (ref 3.87–5.11)
RBC: 2.92 MIL/uL — ABNORMAL LOW (ref 3.87–5.11)
RDW: 14.2 % (ref 11.5–15.5)
RDW: 14.4 % (ref 11.5–15.5)
WBC: 10.6 10*3/uL — AB (ref 4.0–10.5)
WBC: 17.9 10*3/uL — ABNORMAL HIGH (ref 4.0–10.5)
nRBC: 0 % (ref 0.0–0.2)
nRBC: 0 % (ref 0.0–0.2)

## 2018-03-09 LAB — GLUCOSE, CAPILLARY
Glucose-Capillary: 102 mg/dL — ABNORMAL HIGH (ref 70–99)
Glucose-Capillary: 153 mg/dL — ABNORMAL HIGH (ref 70–99)
Glucose-Capillary: 186 mg/dL — ABNORMAL HIGH (ref 70–99)

## 2018-03-09 LAB — PROTIME-INR
INR: 1.1 (ref 0.8–1.2)
Prothrombin Time: 13.7 seconds (ref 11.4–15.2)

## 2018-03-09 LAB — APTT: aPTT: 31 seconds (ref 24–36)

## 2018-03-09 LAB — BASIC METABOLIC PANEL
Anion gap: 9 (ref 5–15)
BUN: 16 mg/dL (ref 8–23)
CO2: 22 mmol/L (ref 22–32)
Calcium: 9.5 mg/dL (ref 8.9–10.3)
Chloride: 106 mmol/L (ref 98–111)
Creatinine, Ser: 1.09 mg/dL — ABNORMAL HIGH (ref 0.44–1.00)
GFR calc Af Amer: 59 mL/min — ABNORMAL LOW (ref >60)
GFR calc non Af Amer: 51 mL/min — ABNORMAL LOW (ref >60)
Glucose, Bld: 136 mg/dL — ABNORMAL HIGH (ref 70–99)
POTASSIUM: 4.9 mmol/L (ref 3.5–5.1)
SODIUM: 137 mmol/L (ref 135–145)

## 2018-03-09 LAB — MAGNESIUM: Magnesium: 1.5 mg/dL — ABNORMAL LOW (ref 1.7–2.4)

## 2018-03-09 SURGERY — ENDARTERECTOMY, FEMORAL
Anesthesia: General | Site: Groin | Laterality: Left

## 2018-03-09 MED ORDER — ONDANSETRON HCL 4 MG/2ML IJ SOLN
INTRAMUSCULAR | Status: AC
  Filled 2018-03-09: qty 2

## 2018-03-09 MED ORDER — OXYCODONE HCL 5 MG/5ML PO SOLN: 5.0000 mg | Freq: Once | ORAL | Status: DC | PRN

## 2018-03-09 MED ORDER — EVICEL 2 ML EX KIT
PACK | CUTANEOUS | Status: DC | PRN
  Administered 2018-03-09: 1

## 2018-03-09 MED ORDER — MIDAZOLAM HCL 2 MG/2ML IJ SOLN
INTRAMUSCULAR | Status: AC
  Filled 2018-03-09: qty 2

## 2018-03-09 MED ORDER — OXYCODONE HCL 5 MG PO TABS: 5.0000 mg | ORAL_TABLET | Freq: Once | ORAL | Status: DC | PRN

## 2018-03-09 MED ORDER — PHENYLEPHRINE HCL 10 MG/ML IJ SOLN
INTRAMUSCULAR | Status: DC | PRN
  Administered 2018-03-09: 50 ug via INTRAVENOUS
  Administered 2018-03-09 (×2): 100 ug via INTRAVENOUS

## 2018-03-09 MED ORDER — PROMETHAZINE HCL 25 MG/ML IJ SOLN: 6.2500 mg | INTRAMUSCULAR | Status: DC | PRN

## 2018-03-09 MED ORDER — EVICEL 5 ML EX KIT
PACK | CUTANEOUS | Status: AC
  Filled 2018-03-09: qty 1

## 2018-03-09 MED ORDER — MEPERIDINE HCL 50 MG/ML IJ SOLN: 6.2500 mg | INTRAMUSCULAR | Status: DC | PRN

## 2018-03-09 MED ORDER — HEPARIN SOD (PORK) LOCK FLUSH 10 UNIT/ML IV SOLN
INTRAVENOUS | Status: DC | PRN
  Administered 2018-03-09: 100 mL

## 2018-03-09 MED ORDER — PROPOFOL 10 MG/ML IV BOLUS
INTRAVENOUS | Status: DC | PRN
  Administered 2018-03-09: 100 mg via INTRAVENOUS

## 2018-03-09 MED ORDER — ROCURONIUM BROMIDE 50 MG/5ML IV SOLN
INTRAVENOUS | Status: AC
  Filled 2018-03-09: qty 1

## 2018-03-09 MED ORDER — MAGNESIUM SULFATE 2 GM/50ML IV SOLN: 2.0000 g | Freq: Once | INTRAVENOUS | Status: DC

## 2018-03-09 MED ORDER — PROPOFOL 10 MG/ML IV BOLUS
INTRAVENOUS | Status: AC
  Filled 2018-03-09: qty 20

## 2018-03-09 MED ORDER — FENTANYL CITRATE (PF) 100 MCG/2ML IJ SOLN
INTRAMUSCULAR | Status: DC | PRN
  Administered 2018-03-09 (×4): 50 ug via INTRAVENOUS

## 2018-03-09 MED ORDER — SODIUM CHLORIDE 0.9 % IV SOLN
INTRAVENOUS | Status: DC
  Administered 2018-03-10 – 2018-03-12 (×4): via INTRAVENOUS

## 2018-03-09 MED ORDER — ROCURONIUM BROMIDE 100 MG/10ML IV SOLN
INTRAVENOUS | Status: DC | PRN
  Administered 2018-03-09: 20 mg via INTRAVENOUS
  Administered 2018-03-09: 50 mg via INTRAVENOUS
  Administered 2018-03-09 (×2): 20 mg via INTRAVENOUS

## 2018-03-09 MED ORDER — HEPARIN SODIUM (PORCINE) 5000 UNIT/ML IJ SOLN
INTRAMUSCULAR | Status: AC
  Filled 2018-03-09: qty 1

## 2018-03-09 MED ORDER — LIDOCAINE HCL (CARDIAC) PF 100 MG/5ML IV SOSY
PREFILLED_SYRINGE | INTRAVENOUS | Status: DC | PRN
  Administered 2018-03-09: 100 mg via INTRAVENOUS

## 2018-03-09 MED ORDER — FENTANYL CITRATE (PF) 250 MCG/5ML IJ SOLN
INTRAMUSCULAR | Status: AC
  Filled 2018-03-09: qty 5

## 2018-03-09 MED ORDER — FENTANYL CITRATE (PF) 100 MCG/2ML IJ SOLN: 25.0000 ug | INTRAMUSCULAR | Status: DC | PRN

## 2018-03-09 MED ORDER — MIDAZOLAM HCL 2 MG/2ML IJ SOLN
INTRAMUSCULAR | Status: DC | PRN
  Administered 2018-03-09 (×2): 1 mg via INTRAVENOUS

## 2018-03-09 MED ORDER — HEPARIN SODIUM (PORCINE) 1000 UNIT/ML IJ SOLN
INTRAMUSCULAR | Status: AC
  Filled 2018-03-09: qty 1

## 2018-03-09 MED ORDER — SUGAMMADEX SODIUM 200 MG/2ML IV SOLN
INTRAVENOUS | Status: DC | PRN
  Administered 2018-03-09: 150 mg via INTRAVENOUS

## 2018-03-09 MED ORDER — LACTATED RINGERS IV SOLN
INTRAVENOUS | Status: DC | PRN
  Administered 2018-03-09: 14:00:00 via INTRAVENOUS

## 2018-03-09 MED ORDER — SEVOFLURANE IN SOLN
RESPIRATORY_TRACT | Status: AC
  Filled 2018-03-09: qty 250

## 2018-03-09 MED ORDER — CEFAZOLIN SODIUM-DEXTROSE 2-3 GM-%(50ML) IV SOLR
INTRAVENOUS | Status: DC | PRN
  Administered 2018-03-09: 2 g via INTRAVENOUS

## 2018-03-09 MED ORDER — HEPARIN SODIUM (PORCINE) 1000 UNIT/ML IJ SOLN
INTRAMUSCULAR | Status: DC | PRN
  Administered 2018-03-09: 5000 [IU] via INTRAVENOUS

## 2018-03-09 SURGICAL SUPPLY — 69 items
APPLIER CLIP 11 MED OPEN (CLIP)
APPLIER CLIP 9.375 SM OPEN (CLIP)
BAG COUNTER SPONGE EZ (MISCELLANEOUS) ×2 IMPLANT
BAG DECANTER FOR FLEXI CONT (MISCELLANEOUS) ×4 IMPLANT
BAG ISOLATATION DRAPE 20X20 ST (DRAPES) IMPLANT
BLADE SURG 15 STRL LF DISP TIS (BLADE) ×2 IMPLANT
BLADE SURG 15 STRL SS (BLADE) ×2
BLADE SURG SZ11 CARB STEEL (BLADE) ×4 IMPLANT
BOOT SUTURE AID YELLOW STND (SUTURE) ×4 IMPLANT
BRUSH SCRUB EZ  4% CHG (MISCELLANEOUS) ×2
BRUSH SCRUB EZ 4% CHG (MISCELLANEOUS) ×2 IMPLANT
CANISTER SUCT 1200ML W/VALVE (MISCELLANEOUS) ×4 IMPLANT
CATH EMBL 80X6FR 13.5 STRL (CATHETERS) IMPLANT
CATH EMBL 80X6FR 13.5STRL (CATHETERS) ×2 IMPLANT
CATH EMBOLECTOMY 6X80 (CATHETERS) ×2
CHLORAPREP W/TINT 26ML (MISCELLANEOUS) ×8 IMPLANT
CLIP APPLIE 11 MED OPEN (CLIP) IMPLANT
CLIP APPLIE 9.375 SM OPEN (CLIP) IMPLANT
COUNTER SPONGE BAG EZ (MISCELLANEOUS)
COVER WAND RF STERILE (DRAPES) ×4 IMPLANT
DERMABOND ADVANCED (GAUZE/BANDAGES/DRESSINGS) ×8
DERMABOND ADVANCED .7 DNX12 (GAUZE/BANDAGES/DRESSINGS) ×2 IMPLANT
DRAPE INCISE IOBAN 66X45 STRL (DRAPES) ×4 IMPLANT
DRAPE ISOLATE BAG 20X20 STRL (DRAPES)
DRESSING SURGICEL FIBRLLR 1X2 (HEMOSTASIS) IMPLANT
DRSG OPSITE POSTOP 4X6 (GAUZE/BANDAGES/DRESSINGS) ×4 IMPLANT
DRSG SURGICEL FIBRILLAR 1X2 (HEMOSTASIS) ×4
ELECT CAUTERY BLADE 6.4 (BLADE) ×4 IMPLANT
ELECT REM PT RETURN 9FT ADLT (ELECTROSURGICAL) ×8
ELECTRODE REM PT RTRN 9FT ADLT (ELECTROSURGICAL) ×2 IMPLANT
GLOVE BIO SURGEON STRL SZ7 (GLOVE) ×18 IMPLANT
GLOVE INDICATOR 7.5 STRL GRN (GLOVE) ×6 IMPLANT
GOWN STRL REUS W/ TWL LRG LVL3 (GOWN DISPOSABLE) ×2 IMPLANT
GOWN STRL REUS W/ TWL XL LVL3 (GOWN DISPOSABLE) ×4 IMPLANT
GOWN STRL REUS W/TWL LRG LVL3 (GOWN DISPOSABLE) ×4
GOWN STRL REUS W/TWL XL LVL3 (GOWN DISPOSABLE) ×4
HEMOSTAT SURGICEL 2X3 (HEMOSTASIS) ×4 IMPLANT
IV NS 500ML (IV SOLUTION) ×2
IV NS 500ML BAXH (IV SOLUTION) ×2 IMPLANT
KIT TURNOVER KIT A (KITS) ×4 IMPLANT
LABEL OR SOLS (LABEL) ×4 IMPLANT
LOOP RED MAXI  1X406MM (MISCELLANEOUS) ×4
LOOP VESSEL MAXI 1X406 RED (MISCELLANEOUS) ×4 IMPLANT
LOOP VESSEL MINI 0.8X406 BLUE (MISCELLANEOUS) ×4 IMPLANT
LOOPS BLUE MINI 0.8X406MM (MISCELLANEOUS) ×6
NS IRRIG 500ML POUR BTL (IV SOLUTION) ×4 IMPLANT
PACK BASIN MAJOR ARMC (MISCELLANEOUS) ×4 IMPLANT
PACK UNIVERSAL (MISCELLANEOUS) ×4 IMPLANT
PATCH CAROTID ECM VASC 1X10 (Prosthesis & Implant Heart) ×4 IMPLANT
SUT MNCRL 4-0 (SUTURE) ×4
SUT MNCRL 4-0 27XMFL (SUTURE) ×4
SUT PROLENE 5 0 RB 1 DA (SUTURE) ×8 IMPLANT
SUT PROLENE 6 0 BV (SUTURE) ×36 IMPLANT
SUT PROLENE 7 0 BV 1 (SUTURE) ×16 IMPLANT
SUT SILK 2 0 (SUTURE) ×2
SUT SILK 2-0 18XBRD TIE 12 (SUTURE) ×2 IMPLANT
SUT SILK 3 0 (SUTURE) ×2
SUT SILK 3-0 18XBRD TIE 12 (SUTURE) ×2 IMPLANT
SUT SILK 4 0 (SUTURE) ×2
SUT SILK 4-0 18XBRD TIE 12 (SUTURE) ×2 IMPLANT
SUT VIC AB 2-0 CT1 27 (SUTURE) ×8
SUT VIC AB 2-0 CT1 TAPERPNT 27 (SUTURE) ×4 IMPLANT
SUT VIC AB 3-0 SH 27 (SUTURE) ×4
SUT VIC AB 3-0 SH 27X BRD (SUTURE) ×2 IMPLANT
SUT VICRYL+ 3-0 36IN CT-1 (SUTURE) ×8 IMPLANT
SUTURE MNCRL 4-0 27XMF (SUTURE) ×2 IMPLANT
SYR 20CC LL (SYRINGE) ×4 IMPLANT
SYR 5ML LL (SYRINGE) ×4 IMPLANT
TRAY FOLEY MTR SLVR 16FR STAT (SET/KITS/TRAYS/PACK) ×4 IMPLANT

## 2018-03-09 NOTE — Anesthesia Postprocedure Evaluation (Signed)
Anesthesia Post Note  Patient: Cheryl Leonard  Procedure(s) Performed: ENDARTERECTOMY FEMORAL (Bilateral Groin) ENDARTERECTOMY POPLITEAL AND SFA (Left Groin)  Patient location during evaluation: PACU Anesthesia Type: General Level of consciousness: awake and alert Pain management: pain level controlled Vital Signs Assessment: post-procedure vital signs reviewed and stable Respiratory status: spontaneous breathing, nonlabored ventilation, respiratory function stable and patient connected to nasal cannula oxygen Cardiovascular status: blood pressure returned to baseline and stable Postop Assessment: no apparent nausea or vomiting Anesthetic complications: no     Last Vitals:  Vitals:   03/09/18 1829 03/09/18 1905  BP: (!) 109/53 (!) 97/59  Pulse: 80 80  Resp: 18 20  Temp: 36.8 C 36.9 C  SpO2: 95% 95%    Last Pain:  Vitals:   03/09/18 1905  TempSrc: Oral  PainSc:                  Lenard Simmer

## 2018-03-09 NOTE — OR Nursing (Signed)
Dr. Wyn Quaker and Dr. Priscella Mann notified of tympanic temp. Of 101.2. They report it is okay to proceed with procedure today.

## 2018-03-09 NOTE — Progress Notes (Signed)
Area of Pt left upper thigh lateral to the PAD has gotten dk red through the morning. It started out with light red scattered areas, a mark was drawn around this area. By this AM the outlined area had been filled in. and color ha.d darkened,.

## 2018-03-09 NOTE — Transfer of Care (Signed)
Immediate Anesthesia Transfer of Care Note  Patient: Cheryl Leonard  Procedure(s) Performed: ENDARTERECTOMY FEMORAL (Bilateral Groin) ENDARTERECTOMY POPLITEAL AND SFA (Left Groin)  Patient Location: PACU  Anesthesia Type:General  Level of Consciousness: awake, alert  and oriented  Airway & Oxygen Therapy: Patient Spontanous Breathing and Patient connected to nasal cannula oxygen  Post-op Assessment: Report given to RN and Post -op Vital signs reviewed and stable  Post vital signs: Reviewed and stable  Last Vitals:  Vitals Value Taken Time  BP 107/60 03/09/2018  5:56 PM  Temp    Pulse 83 03/09/2018  5:57 PM  Resp 15 03/09/2018  5:57 PM  SpO2 99 % 03/09/2018  5:57 PM  Vitals shown include unvalidated device data.  Last Pain:  Vitals:   03/09/18 1313  TempSrc: Tympanic  PainSc: 6       Patients Stated Pain Goal: 3 (03/09/18 1313)  Complications: No apparent anesthesia complications

## 2018-03-09 NOTE — Anesthesia Post-op Follow-up Note (Signed)
Anesthesia QCDR form completed.        

## 2018-03-09 NOTE — H&P (Signed)
[facility] History & Physical Update  The patient was interviewed and re-examined.  The patient's previous History and Physical has been reviewed and is unchanged.  There is no change in the plan of care. We plan to proceed with the scheduled procedure.  Festus Barren, MD  03/09/2018, 1:35 PM

## 2018-03-09 NOTE — Op Note (Signed)
OPERATIVE NOTE   PROCEDURE: 1. Left common femoral and superficial femoral artery endarterectomies 2.   Right common femoral, profunda femoris, and superficial femoral artery endarterectomies 3.   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 significant plaque in bilateral common femoral and superficial femoral arteries as well as the right profunda femorus artery  SPECIMEN(S): Bilateral common femoral and superficial femoral artery plaque. Right profunda femorus plaque. Left SFA and popliteal thrombus.   INDICATIONS:  Patient presents with severe PAD.  Angiogram yesterday demonstrated disease in the right common femoral, the origin of the SFA, and the profunda femoris artery causing significant obstruction as well as left common femoral and proximal SFA disease.  Bilateral femoral endarterectomies are planned to try to improve perfusion. The risks and benefits as well as alternative therapies including intervention were reviewed in detail all questions were answered the patient agrees to proceed with surgery. An assistant was present during the procedure to help facilitate the exposure and expedite the procedure.  DESCRIPTION: After obtaining full informed written consent, the patient was brought back to the operating room and placed supine upon the operating table. The patient received IV antibiotics prior to induction. After obtaining adequate anesthesia, the patient was prepped and draped in the standard fashion appropriate time out is called. Co-surgeons are necessary due to the complex nature of the operation and the bilateral  nature of the operation.  Our physician's assistant was also present for an assistant due to the complex nature of the disease to expedite and improve the procedure as well as assist with wound closure, maximal visualization, lighting adjustments, and retraction.  With myself working on the right and Dr. Gilda Crease working on the left we began by dissecting out the femoral arteries on each side. Vertical incisions were created overlying both femoral arteries. The common femoral artery proximally, and superficial femoral artery, and primary profunda femoris artery branches were encircled with vessel loops and prepared for control.  The right femoral arteries were found to have significant plaque from the common femoral artery into the profunda and superficial femoral arteries.  The left common femoral artery had plaque in the common femoral artery extending to the proximal superficial femoral artery.   5000 units of heparin was given and allowed circulate for 5 minutes.   Attention is then turned to the right femoral artery. An arteriotomy is made with 11 blade and extended with Potts scissors in the common femoral artery and carried down onto the first 1-2 cm of the right superficial femoral artery.  This was down onto the stent and the proximal portion of the stent was resected to ensure that there was no obstruction of the profunda femoris origin.  This markedly increased the complexity of the right superficial femoral artery endarterectomy.  An endarterectomy was then performed. The [facility] was used to create a plane. The proximal endpoint was cut flush with tenotomy scissors. This was in the proximal common femoral artery. An eversion endarterectomy was then performed for the first 2-3 cm of the profunda femoris artery with a large bulky plaque removed and a nice feathered endpoint seen. Good backbleeding was then seen.  The profunda femoris artery was then tacked down with two  7-0 Prolene  sutures.  The distal endpoint of the superficial femoral endarterectomy was created with gentle traction and the distal endpoint was clean and cut flush with the edge of the stent which did increase the complexity.  The stent was tacked to the wall with three 6-0 Prolene sutures to avoid flow around the stent in the right SFA. The Cormatrix patcth is then selected and prepared for a patch angioplasty.  It is cut and beveled and started at the proximal endpoint with a 6-0 Prolene suture.  Approximately one half of the suture line is run medially and laterally and the distal end point was cut and bevelled to match the arteriotomy.  A second 6-0 Prolene was started at the distal end point and run to the mid portion to complete the arteriotomy.  The vessel was flushed prior to release of control and completion of the anastomosis.  At this point, flow was established first to the profunda femoris artery and then to the superficial femoral artery. Easily palpable pulses are noted well beyond the anastomosis and both arteries.  The left femoral artery is then addressed. Arteriotomy is made in the common femoral artery and extended down into the proximal superficial femoral artery.  There was no backbleeding from the left SFA where the previously placed stents had been performed and there was concerned that she could have thrombosed after her access on the left common femoral artery yesterday.  About 6 passes were performed with a 3 Fogarty embolectomy balloon down the left SFA and popliteal artery easily able to get down to 50 to 60 cm which was probably to the peroneal artery.  The first 3 passes removed a large amount of thrombus with brisk return of backbleeding.  Scant thrombus was returned on the next 2 passes and no thrombus was returned with the Fogarty embolectomy on the sixth pass.  At this point, there was brisk backbleeding from the left SFA.  This was clearly more fresh thrombus and likely occurred after her  procedure yesterday as her studies were normal a few weeks ago.  Similarly, an endarterectomy was performed with the [facility]. The proximal endpoint was cut flush with tenotomy scissors in the proximal common femoral artery.  The arterectomy was carried around the origin of the profunda femoris artery with no significant plaque seen in the left profunda femoris artery that would require endarterectomy.  The arteriotomy was carried down onto the superficial femoral artery and the endarterectomy was continued to this point. The distal endpoint was cut flush and tacked down with two 7-0 Prolene sutures.. The Cormatrix extracellular patch was then brought onto the field.  It is cut and beveled and started at the proximal endpoint with a 6-0 Prolene suture.  Approximately one half of the suture line is run medially and laterally and the distal end point was cut and bevelled to match the arteriotomy.  A second 6-0 Prolene was started at the distal end point and run to the mid portion to complete the arteriotomy.   Flushing maneuvers were performed and flow was reestablished to the femoral vessels. Excellent pulses noted in the left superficial femoral and profunda femoris artery below the femoral anastomosis.  Tenuous wave Doppler was used and excellent triphasic signals were heard both on the left and the right side in the SFA and profunda femoris arteries after these procedures.  Surgicel and Evicel topical hemostatic agents were placed in the femoral incisions and hemostasis was complete. The femoral incisions were then  closed in a layered fashion with 2 layers of 2-0 Vicryl, 2 layers of 3-0 Vicryl, and 4-0 Monocryl for the skin closure. Dermabond and sterile dressing were then placed over all incisions.  The patient was then awakened from anesthesia and taken to the recovery room in stable condition having tolerated the procedure well.  COMPLICATIONS: None  CONDITION: Stable     Festus Barren 03/09/2018 5:39 PM  This note was created with Dragon Medical transcription system. Any errors in dictation are purely unintentional.

## 2018-03-09 NOTE — Anesthesia Procedure Notes (Signed)
Procedure Name: Intubation Date/Time: 03/09/2018 2:45 PM Performed by: Justus Memory, CRNA Pre-anesthesia Checklist: Patient identified, Patient being monitored, Timeout performed, Emergency Drugs available and Suction available Patient Re-evaluated:Patient Re-evaluated prior to induction Oxygen Delivery Method: Circle system utilized Preoxygenation: Pre-oxygenation with 100% oxygen Induction Type: IV induction Ventilation: Mask ventilation without difficulty Laryngoscope Size: Mac and 3 Grade View: Grade I Tube type: Oral Tube size: 7.0 mm Number of attempts: 1 Airway Equipment and Method: Stylet Placement Confirmation: ETT inserted through vocal cords under direct vision,  positive ETCO2 and breath sounds checked- equal and bilateral Secured at: 21 cm Tube secured with: Tape Dental Injury: Teeth and Oropharynx as per pre-operative assessment

## 2018-03-09 NOTE — Op Note (Signed)
OPERATIVE NOTE   PROCEDURE: 1. Right common femoral, superficial femoral and profunda femoris endarterectomy with Cormatrix patch angioplasty 2. Left common femoral and superficial femoral artery endarterectomy with core matrix patch angioplasty. 3. Fogarty embolectomy left SFA and popliteal  PRE-OPERATIVE DIAGNOSIS: Atherosclerotic occlusive disease bilateral lower extremities with lifestyle limiting claudication on the right and mild rest pain associated with ulceration on the left; hypertension; diabetes mellitus  POST-OPERATIVE DIAGNOSIS: Same  CO-SURGEON: Renford Dills, MD and Annice Needy, M.D.  ASSISTANT(S): None  ANESTHESIA: general  ESTIMATED BLOOD LOSS: 800 cc  FINDING(S): Profound plaque noted bilaterally extending past the initial bifurcation of the profunda femoris artery on the right as well as down the extensive length of the SFA bilaterally  SPECIMEN(S):   plaque from the common femoral, superficial femoral and the profunda femoris arteries bilaterally; thrombus associated with the thromboembolectomy from the left SFA and popliteal  INDICATIONS:   Cheryl Leonard 73 y.o. y.o.female who presents with complaints of lifestyle limiting claudication and pain associated with ulceration of the left. The patient has documented severe atherosclerotic occlusive disease and has undergone multiple minimally invasive treatments in the past. However, at this point his primary area of stricture stenosis resides in the common femoral and origins of the superficial femoral and profunda femoris extending into these arteries and therefore this is not amenable to intervention and he is now undergoing open endarterectomy. The risks and benefits of been reviewed with the patient, all questions have answered; alternative therapies have been reviewed as well and the patient has agreed to proceed with surgical open repair.  DESCRIPTION: After obtaining full informed written  consent, the patient was brought back to the operating room and placed supine upon the operating table.  The patient received IV antibiotics prior to induction.  After obtaining adequate anesthesia, the patient was prepped and draped in the standard fashion for: bilateral femoral exposure.  Co-surgeons are required because this is a bilateral procedure with work being performed simultaneously from both the right femoral and left femoral approach.  This also expedite the procedure making a shorter operative time reducing complications and improving patient safety.  Attention was turned to the bilateral groins with Dr. Wyn Quaker working on the patient's right and myself working on the left of the patient.  Vertical  incisions were made over the common femoral artery and dissected down to the common femoral artery with electrocautery.  The bilateral common femoral arteries were dissected in their entirety from the distal external iliac artery (identified by the superficial circumflex vessels) down to the femoral bifurcation.  On initial inspection, the common femoral artery was: Densely fibrotic and there was no palpable pulse noted bilaterally.    Subsequently the dissection was continued to include all circumflex branches and the profunda femoral artery and superficial femoral artery. The superficial femoral artery was dissected circumferentially for a distance of approximately 3-4 cm bilaterally and the profunda femoris was dissected circumferentially out to the third order branches on the right.  Individual vessel loops were placed around each branch. Both of the groins were treated simultaneously as described above. Control of all branches was obtained with vessel loops.  A softer area in the distal external iliac artery amendable to clamping was identified.    The patient was given 5000 units of Heparin intravenously, which was a therapeutic bolus.   After waiting 3 minutes, the right distal external iliac artery  was clamped and placed all circumflex branches, and the profunda and superficial  femoral arteries under tension.  Arteriotomy was made in the right common femoral artery with a 11-blade and extended it with a Potts scissor proximally and distally extending the distal end down the SFA for approximately 3 cm.   Endarterectomy was then performed under direct visualization using a freer elevator and a right angle from the mid common femoral extending up both proximally and distally. Proximally the endarterectomy was brought up to the level of the clamp where a clean edge was obtained. Distally the endarterectomy was carried down to a soft spot in the SFA where extensive endarterectomy was required.  A previously placed stent was involved and partial resection of the pre-existing via bond stent was necessary.  Also extensive endarterectomy of the profunda femoris was required to achieve a feathered edge.  The complexity of the superficial femoral artery endarterectomy was also reflected in the difficulty of securing the leading edge of the stent so that it would not create a flap or cause a dissection.  To achieve this multiple 7-0 Prolene interrupted tacking sutures were placed to secure the leading edge of the stent and the plaque in the SFA.  The profunda femoris was treated with an eversion technique extending endarterectomy approximately 2-3 cm distally again obtaining a featheredge on the right side.    At this point, a core matrix patch was fashioned for the geometry of the arteriotomy.  The patch was sewn to the right arteriotomy with 2 running stitches of 6-0 Prolene, running from each end.  Prior to completing the patch angioplasty, the profunda femoral artery was flushed as was the superficial femoral artery. The system was then forward flushed. The endarterectomy site was then irrigated copiously with heparinized saline. The patch angioplasty was completed in the usual fashion.  Flow was then  reestablished first to the profunda femoris and then the superficial femoral artery. Any gaps or bleeding sites in the suture line were easily controlled with a 6-0 Prolene suture.  Attention was then turned to the left side.  Arteriotomy was made in the left common femoral artery with a 11-blade and extended it with a Potts scissor proximally and distally extending the distal end down the left SFA for approximately 3 cm.  There was absolutely no backbleeding from the left SFA once it had been opened and therefore a #3 Fogarty was delivered onto the field for 5 passes were made down to 50+ centimeters.  A significant amount of thrombus was retrieved and added to the specimen.  After the second or third pass backbleeding did begin.  This continued to improve with further passes of the Fogarty and ultimately the final pass did not retrieve any further material.  Endarterectomy was then performed under direct visualization using a freer elevator and a right angle from the mid common femoral extending up both proximally and distally. Proximally the endarterectomy was brought up to the level of the clamp where a clean edge was obtained. Distally the endarterectomy was carried down to a soft spot in the SFA where a feathered edge would was obtained.  7-0 Prolene interrupted tacking sutures were placed to secure the leading edge of the plaque in the SFA.  At this point, a core matrix patch was fashioned for the geometry of the arteriotomy.  The patch was sewn to the left artery with 2 running stitches of 6-0 Prolene, running from each end.  Prior to completing the patch angioplasty, the profunda femoral artery was flushed as was the superficial femoral artery. The system  was then forward flushed. The endarterectomy site was then irrigated copiously with heparinized saline. The patch angioplasty was completed in the usual fashion.  Flow was then reestablished first to the profunda femoris and then the superficial  femoral artery. Any gaps or bleeding sites in the suture line were easily controlled with a 6-0 Prolene suture.   Doppler is then delivered onto the field and the bilateral SFA as well as the profunda femoris arteries were interrogated and found to have triphasic Doppler signals.  Both right and left groins were then irrigated copiously with sterile saline and subsequently Evicel and Surgicel were placed in the wound. The incision was repaired with a double layer of 2-0 Vicryl, a double layer of 3-0 Vicryl, and a layer of 4-0 Monocryl in a subcuticular fashion.  The skin was cleaned, dried, and reinforced with Dermabond.  COMPLICATIONS: None  CONDITION: Almon Register, M.D. [facility] Office: [phone]  03/09/2018, 6:19 PM

## 2018-03-10 DIAGNOSIS — K219 Gastro-esophageal reflux disease without esophagitis: Secondary | ICD-10-CM | POA: Diagnosis present

## 2018-03-10 DIAGNOSIS — E785 Hyperlipidemia, unspecified: Secondary | ICD-10-CM | POA: Diagnosis present

## 2018-03-10 DIAGNOSIS — Z7982 Long term (current) use of aspirin: Secondary | ICD-10-CM | POA: Diagnosis not present

## 2018-03-10 DIAGNOSIS — E1151 Type 2 diabetes mellitus with diabetic peripheral angiopathy without gangrene: Secondary | ICD-10-CM | POA: Diagnosis present

## 2018-03-10 DIAGNOSIS — I10 Essential (primary) hypertension: Secondary | ICD-10-CM | POA: Diagnosis present

## 2018-03-10 DIAGNOSIS — R710 Precipitous drop in hematocrit: Secondary | ICD-10-CM | POA: Diagnosis not present

## 2018-03-10 DIAGNOSIS — I70248 Atherosclerosis of native arteries of left leg with ulceration of other part of lower left leg: Secondary | ICD-10-CM | POA: Diagnosis present

## 2018-03-10 DIAGNOSIS — M1990 Unspecified osteoarthritis, unspecified site: Secondary | ICD-10-CM | POA: Diagnosis present

## 2018-03-10 DIAGNOSIS — E11621 Type 2 diabetes mellitus with foot ulcer: Secondary | ICD-10-CM | POA: Diagnosis present

## 2018-03-10 DIAGNOSIS — Z7902 Long term (current) use of antithrombotics/antiplatelets: Secondary | ICD-10-CM | POA: Diagnosis not present

## 2018-03-10 DIAGNOSIS — M109 Gout, unspecified: Secondary | ICD-10-CM | POA: Diagnosis present

## 2018-03-10 DIAGNOSIS — I739 Peripheral vascular disease, unspecified: Secondary | ICD-10-CM | POA: Diagnosis present

## 2018-03-10 DIAGNOSIS — Z79890 Hormone replacement therapy (postmenopausal): Secondary | ICD-10-CM | POA: Diagnosis not present

## 2018-03-10 DIAGNOSIS — I70221 Atherosclerosis of native arteries of extremities with rest pain, right leg: Secondary | ICD-10-CM | POA: Diagnosis present

## 2018-03-10 DIAGNOSIS — Z87891 Personal history of nicotine dependence: Secondary | ICD-10-CM | POA: Diagnosis not present

## 2018-03-10 LAB — BASIC METABOLIC PANEL
ANION GAP: 10 (ref 5–15)
BUN: 22 mg/dL (ref 8–23)
CALCIUM: 8.8 mg/dL — AB (ref 8.9–10.3)
CO2: 22 mmol/L (ref 22–32)
Chloride: 106 mmol/L (ref 98–111)
Creatinine, Ser: 1.55 mg/dL — ABNORMAL HIGH (ref 0.44–1.00)
GFR calc Af Amer: 38 mL/min — ABNORMAL LOW (ref >60)
GFR calc non Af Amer: 33 mL/min — ABNORMAL LOW (ref >60)
Glucose, Bld: 152 mg/dL — ABNORMAL HIGH (ref 70–99)
Potassium: 4.8 mmol/L (ref 3.5–5.1)
Sodium: 138 mmol/L (ref 135–145)

## 2018-03-10 LAB — GLUCOSE, CAPILLARY
Glucose-Capillary: 162 mg/dL — ABNORMAL HIGH (ref 70–99)
Glucose-Capillary: 145 mg/dL — ABNORMAL HIGH (ref 70–99)
Glucose-Capillary: 142 mg/dL — ABNORMAL HIGH (ref 70–99)

## 2018-03-10 LAB — CBC
HCT: 22.3 % — ABNORMAL LOW (ref 36.0–46.0)
Hemoglobin: 7.3 g/dL — ABNORMAL LOW (ref 12.0–15.0)
MCH: 29.7 pg (ref 26.0–34.0)
MCHC: 32.7 g/dL (ref 30.0–36.0)
MCV: 90.7 fL (ref 80.0–100.0)
Platelets: 310 10*3/uL (ref 150–400)
RBC: 2.46 MIL/uL — ABNORMAL LOW (ref 3.87–5.11)
RDW: 14.5 % (ref 11.5–15.5)
WBC: 15.2 10*3/uL — ABNORMAL HIGH (ref 4.0–10.5)
nRBC: 0 % (ref 0.0–0.2)

## 2018-03-10 LAB — HEMOGLOBIN AND HEMATOCRIT, BLOOD
HCT: 21.2 % — ABNORMAL LOW (ref 36.0–46.0)
Hemoglobin: 7 g/dL — ABNORMAL LOW (ref 12.0–15.0)

## 2018-03-10 LAB — APTT: APTT: 30 s (ref 24–36)

## 2018-03-10 MED ORDER — INSULIN ASPART 100 UNIT/ML ~~LOC~~ SOLN
0.0000 [IU] | Freq: Three times a day (TID) | SUBCUTANEOUS | Status: DC
  Administered 2018-03-10: 1 [IU] via SUBCUTANEOUS
  Administered 2018-03-10 – 2018-03-11 (×2): 2 [IU] via SUBCUTANEOUS
  Filled 2018-03-10 (×3): qty 1

## 2018-03-10 MED ORDER — FERROUS SULFATE 325 (65 FE) MG PO TABS
325.0000 mg | ORAL_TABLET | Freq: Three times a day (TID) | ORAL | Status: DC
  Administered 2018-03-10 – 2018-03-12 (×5): 325 mg via ORAL
  Filled 2018-03-10 (×5): qty 1

## 2018-03-10 MED ORDER — INSULIN ASPART 100 UNIT/ML ~~LOC~~ SOLN: 0.0000 [IU] | Freq: Every day | SUBCUTANEOUS | Status: DC

## 2018-03-10 MED ORDER — DOCUSATE SODIUM 100 MG PO CAPS: 100.0000 mg | ORAL_CAPSULE | Freq: Two times a day (BID) | ORAL | Status: DC

## 2018-03-10 NOTE — Progress Notes (Signed)
1 Day Post-Op   Subjective/Chief Complaint: Evaluated in the evening and she denies any complaints.  She has been up and about in her room without any discomfort in her legs.  Ultrasound was not performed due to lack of in-house RVT.   Objective: Vital signs in last 24 hours: Temp:  [98.5 F (36.9 C)-99 F (37.2 C)] 98.5 F (36.9 C) (03/07 1959) Pulse Rate:  [79-98] 96 (03/07 1959) Resp:  [18-20] 18 (03/07 1959) BP: (106-116)/(47-60) 106/60 (03/07 1959) SpO2:  [95 %-97 %] 97 % (03/07 1959) Weight:  [78.4 kg] 78.4 kg (03/07 0500)    Intake/Output from previous day: 03/06 0701 - 03/07 0700 In: 2617.3 [P.O.:120; I.V.:2497.3] Out: 1800 [Urine:1000; Blood:800] Intake/Output this shift: No intake/output data recorded.  On clinical examination the patient is unchanged from this morning.  Lower extremity examination was unchanged.  The left leg seems cooler than the right leg but the patient has full mobility of her toes.  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  The left groin hematoma is stable without any changes.  Lab Results:  Recent Labs    03/09/18 1943 03/10/18 0432 03/10/18 1133  WBC 17.9* 15.2*  --   HGB 8.6* 7.3* 7.0*  HCT 27.0* 22.3* 21.2*  PLT 350 310  --    BMET Recent Labs    03/09/18 0405 03/10/18 0432  NA 137 138  K 4.9 4.8  CL 106 106  CO2 22 22  GLUCOSE 136* 152*  BUN 16 22  CREATININE 1.09* 1.55*  CALCIUM 9.5 8.8*   PT/INR Recent Labs    03/09/18 0405  LABPROT 13.7  INR 1.1   ABG No results for input(s): PHART, HCO3 in the last 72 hours.  Invalid input(s): PCO2, PO2  Studies/Results: No results found.  Anti-infectives: Anti-infectives (From admission, onward)   Start     Dose/Rate Route Frequency Ordered Stop   03/09/18 0700  ceFAZolin (ANCEF) IVPB 2g/100 mL premix  Status:  Discontinued    Note to Pharmacy:  Send with patient to OR   2 g 200 mL/hr over 30 Minutes Intravenous  Once 03/08/18 1604 03/09/18  1848   03/07/18 2345  ceFAZolin (ANCEF) IVPB 2g/100 mL premix     2 g 200 mL/hr over 30 Minutes Intravenous  Once 03/07/18 2331 03/08/18 0850      Assessment/Plan: s/p Procedure(s): ENDARTERECTOMY FEMORAL (Bilateral) ENDARTERECTOMY POPLITEAL AND SFA (Left) Continue with same plan.  Will await hemoglobin results in a.m.  LOS: 0 days    Louisa Second 03/10/2018

## 2018-03-10 NOTE — Progress Notes (Signed)
Verbal order to place pt on carb modified diet at this time. Pt updated.   Cheryl Leonard [employer]

## 2018-03-10 NOTE — Progress Notes (Addendum)
1 Day Post-Op   Subjective/Chief Complaint: The patient seems to be comfortable and denies any lower extremity discomfort or pain.  Patient states that she has tenderness over the left and right groins which is unremarkable.  She slept comfortably overnight.   Objective: Vital signs in last 24 hours: Temp:  [97.6 F (36.4 C)-101.2 F (38.4 C)] 98.8 F (37.1 C) (03/07 0446) Pulse Rate:  [79-91] 88 (03/07 0446) Resp:  [13-20] 18 (03/07 0446) BP: (97-166)/(47-71) 116/60 (03/07 0953) SpO2:  [95 %-99 %] 95 % (03/07 0446) Weight:  [78.4 kg] 78.4 kg (03/07 0500)    Intake/Output from previous day: 03/06 0701 - 03/07 0700 In: 2617.3 [P.O.:120; I.V.:2497.3] Out: 1800 [Urine:1000; Blood:800] Intake/Output this shift: Total I/O In: 240 [P.O.:240] Out: -   General appearance: alert, cooperative, appears stated age, no distress and pale.  Lungs and heart are clear.  Abdomen soft nontender nondistended.  Left groin large hematoma and ecchymosis soft to palpation.  The right groin incision site is soft without any erythema or significant hematoma or ecchymosis.  Lower extremity examination she is neurologically intact bilaterally moves her toes and has sensation.  The left foot is pale looking compared to the right.  Doppler signals on the right are biphasic both dorsalis pedis and posterior tibial distribution.  The left foot there is a monophasic Doppler signal over the dorsalis pedis and posterior tibial.  The left popliteal is mono to biphasic.  Rest of her examination is unremarkable.  Lab Results:  Recent Labs    03/09/18 1943 03/10/18 0432  WBC 17.9* 15.2*  HGB 8.6* 7.3*  HCT 27.0* 22.3*  PLT 350 310   BMET Recent Labs    03/09/18 0405 03/10/18 0432  NA 137 138  K 4.9 4.8  CL 106 106  CO2 22 22  GLUCOSE 136* 152*  BUN 16 22  CREATININE 1.09* 1.55*  CALCIUM 9.5 8.8*   PT/INR Recent Labs    03/09/18 0405  LABPROT 13.7  INR 1.1   ABG No results for input(s): PHART,  HCO3 in the last 72 hours.  Invalid input(s): PCO2, PO2  Studies/Results: No results found.  Anti-infectives: Anti-infectives (From admission, onward)   Start     Dose/Rate Route Frequency Ordered Stop   03/09/18 0700  ceFAZolin (ANCEF) IVPB 2g/100 mL premix  Status:  Discontinued    Note to Pharmacy:  Send with patient to OR   2 g 200 mL/hr over 30 Minutes Intravenous  Once 03/08/18 1604 03/09/18 1848   03/07/18 2345  ceFAZolin (ANCEF) IVPB 2g/100 mL premix     2 g 200 mL/hr over 30 Minutes Intravenous  Once 03/07/18 2331 03/08/18 0850      Assessment/Plan: s/p Procedure(s): ENDARTERECTOMY FEMORAL (Bilateral) ENDARTERECTOMY POPLITEAL AND SFA (Left) d/c foley  Sit out of bed.   NPO until next hgb test HxH at noon. She has had another drop in hemoglobin over night and will need follow-up exam.  I will also order a arterial duplex exam of the left leg and be present during the exam.  Continue to monitor doppler signals.  LOS: 0 days    Louisa Second 03/10/2018

## 2018-03-10 NOTE — Progress Notes (Signed)
Verbal order from vascular to place order for pt to receive 325 mg Iron sulfate TID and H& H for 03/11/2018 at 0500. Pt updated.  Neveyah Garzon [employer]

## 2018-03-10 NOTE — Progress Notes (Signed)
Verbal order from on call vascular surgeon to place order for diet NPO, remove foley,CBG- ACHS, sliding scale coverage, H&H and APTT lab, and discontinue bed rest orders. Pt updated  Cheryl Leonard [employer]

## 2018-03-11 ENCOUNTER — Inpatient Hospital Stay: Payer: Medicare Other

## 2018-03-11 LAB — BASIC METABOLIC PANEL
Anion gap: 10 (ref 5–15)
BUN: 28 mg/dL — ABNORMAL HIGH (ref 8–23)
CALCIUM: 9 mg/dL (ref 8.9–10.3)
CO2: 19 mmol/L — ABNORMAL LOW (ref 22–32)
CREATININE: 1.5 mg/dL — AB (ref 0.44–1.00)
Chloride: 103 mmol/L (ref 98–111)
GFR calc Af Amer: 40 mL/min — ABNORMAL LOW (ref >60)
GFR calc non Af Amer: 34 mL/min — ABNORMAL LOW (ref >60)
Glucose, Bld: 140 mg/dL — ABNORMAL HIGH (ref 70–99)
Potassium: 4.2 mmol/L (ref 3.5–5.1)
Sodium: 132 mmol/L — ABNORMAL LOW (ref 135–145)

## 2018-03-11 LAB — CREATININE, SERUM
Creatinine, Ser: 1.55 mg/dL — ABNORMAL HIGH (ref 0.44–1.00)
GFR calc Af Amer: 38 mL/min — ABNORMAL LOW (ref >60)
GFR calc non Af Amer: 33 mL/min — ABNORMAL LOW (ref >60)

## 2018-03-11 LAB — CBC WITH DIFFERENTIAL/PLATELET
Abs Immature Granulocytes: 0.11 10*3/uL — ABNORMAL HIGH (ref 0.00–0.07)
BASOS ABS: 0 10*3/uL (ref 0.0–0.1)
Basophils Relative: 0 %
Eosinophils Absolute: 0.1 10*3/uL (ref 0.0–0.5)
Eosinophils Relative: 1 %
HCT: 17.3 % — ABNORMAL LOW (ref 36.0–46.0)
Hemoglobin: 5.8 g/dL — ABNORMAL LOW (ref 12.0–15.0)
Immature Granulocytes: 1 %
Lymphocytes Relative: 12 %
Lymphs Abs: 1.4 10*3/uL (ref 0.7–4.0)
MCH: 29.7 pg (ref 26.0–34.0)
MCHC: 33.5 g/dL (ref 30.0–36.0)
MCV: 88.7 fL (ref 80.0–100.0)
Monocytes Absolute: 1.4 10*3/uL — ABNORMAL HIGH (ref 0.1–1.0)
Monocytes Relative: 12 %
NRBC: 0 % (ref 0.0–0.2)
Neutro Abs: 8.5 10*3/uL — ABNORMAL HIGH (ref 1.7–7.7)
Neutrophils Relative %: 74 %
Platelets: 233 10*3/uL (ref 150–400)
RBC: 1.95 MIL/uL — ABNORMAL LOW (ref 3.87–5.11)
RDW: 14.6 % (ref 11.5–15.5)
WBC: 11.6 10*3/uL — ABNORMAL HIGH (ref 4.0–10.5)

## 2018-03-11 LAB — PREPARE RBC (CROSSMATCH)

## 2018-03-11 LAB — GLUCOSE, CAPILLARY
Glucose-Capillary: 112 mg/dL — ABNORMAL HIGH (ref 70–99)
Glucose-Capillary: 99 mg/dL (ref 70–99)
Glucose-Capillary: 168 mg/dL — ABNORMAL HIGH (ref 70–99)
Glucose-Capillary: 155 mg/dL — ABNORMAL HIGH (ref 70–99)

## 2018-03-11 LAB — HEMOGLOBIN AND HEMATOCRIT, BLOOD
HCT: 24.9 % — ABNORMAL LOW (ref 36.0–46.0)
HEMATOCRIT: 17.7 % — AB (ref 36.0–46.0)
Hemoglobin: 5.8 g/dL — ABNORMAL LOW (ref 12.0–15.0)
Hemoglobin: 8.5 g/dL — ABNORMAL LOW (ref 12.0–15.0)

## 2018-03-11 LAB — APTT: aPTT: 33 seconds (ref 24–36)

## 2018-03-11 LAB — PROTIME-INR
INR: 1.2 (ref 0.8–1.2)
Prothrombin Time: 14.8 seconds (ref 11.4–15.2)

## 2018-03-11 IMAGING — CT CT ANGIO AOBIFEM WO/W CM
2 of 10 series · 11 of 46 positions shown, 15 images · IV contrast (APPLIED)
Comparison: None.

CLINICAL DATA: Left lower extremity numbness

EXAM:
CT ANGIOGRAPHY OF ABDOMINAL AORTA WITH FEMORAL RUNOFF
TECHNIQUE: Multidetector CT imaging of the abdomen, pelvis and lower
extremities was performed using the standard protocol during bolus
administration of intravenous contrast. Multiplanar CT image
reconstructions and MIPs were obtained to evaluate the vascular
anatomy.
CONTRAST:  80 mL Isovue 370.

[Series 5: axial arterial lower · axial · arterial · 0.83mm/px · z∈[-1263,-621]mm · 10 of 261 slices shown, 13 images]
[im 31/261  soft-tissue]
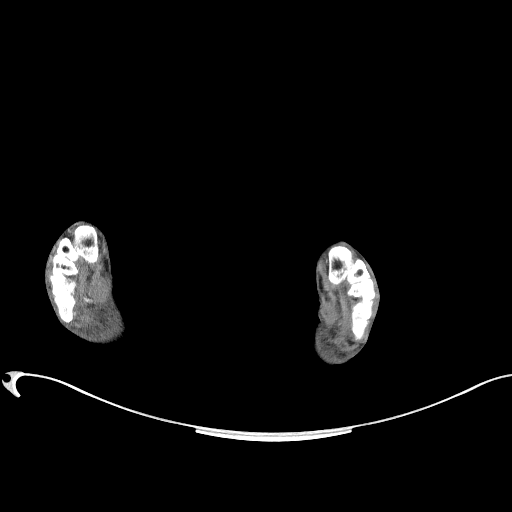
[im 31/261  bone]
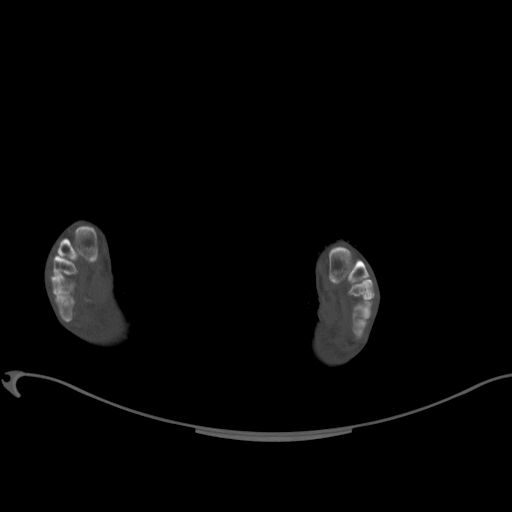
[im 62/261  soft-tissue]
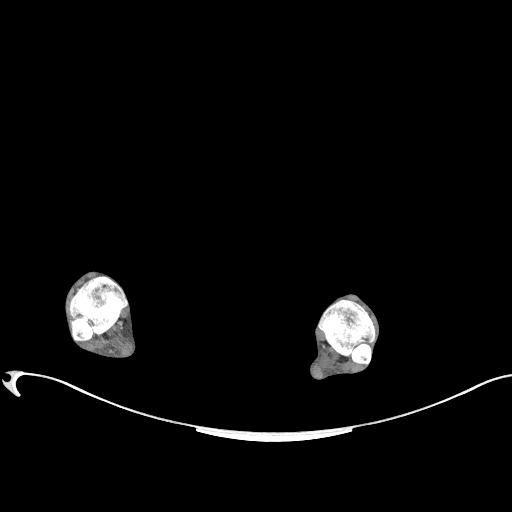
[im 92/261  soft-tissue]
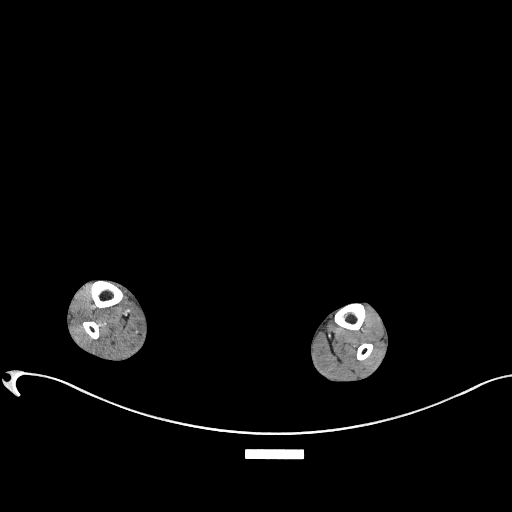
[im 123/261  soft-tissue]
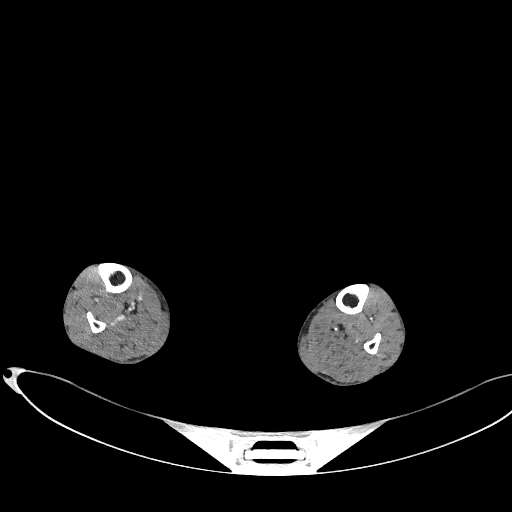
[im 153/261  soft-tissue]
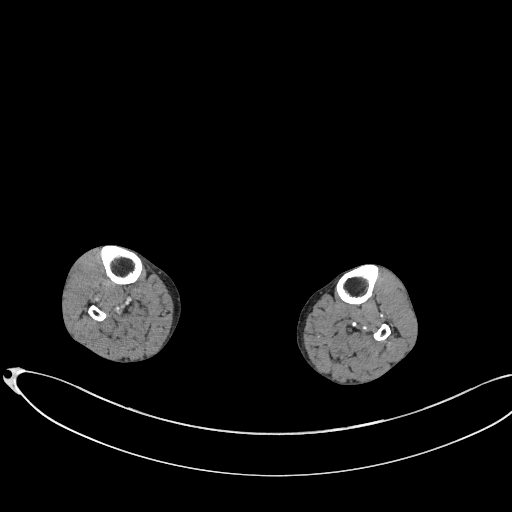
[im 184/261  soft-tissue]
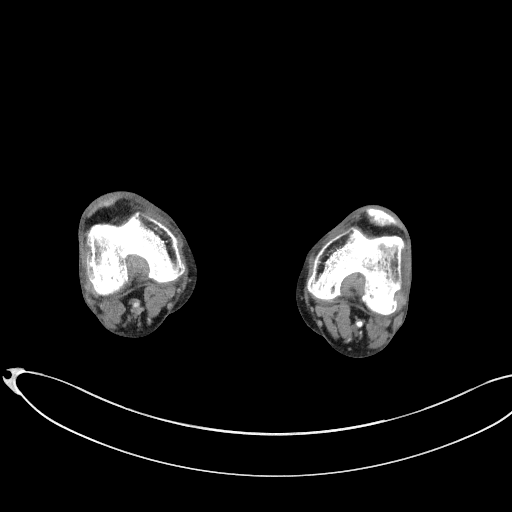
[im 199/261  lung]
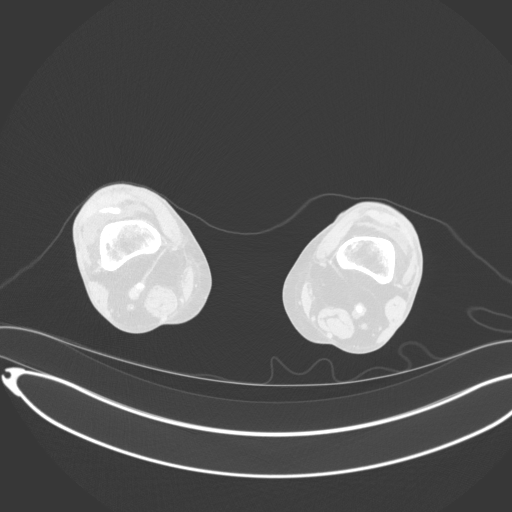
[im 215/261  soft-tissue]
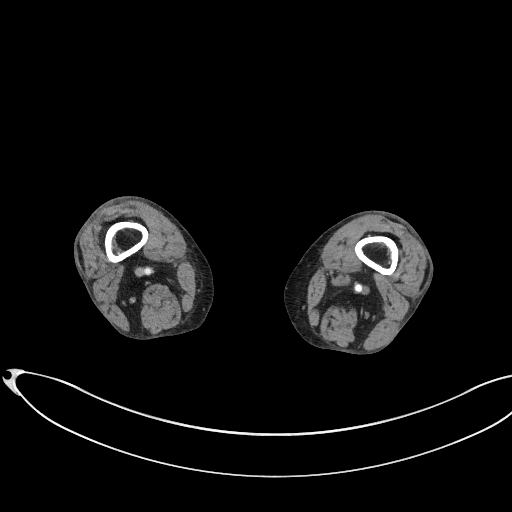
[im 215/261  lung]
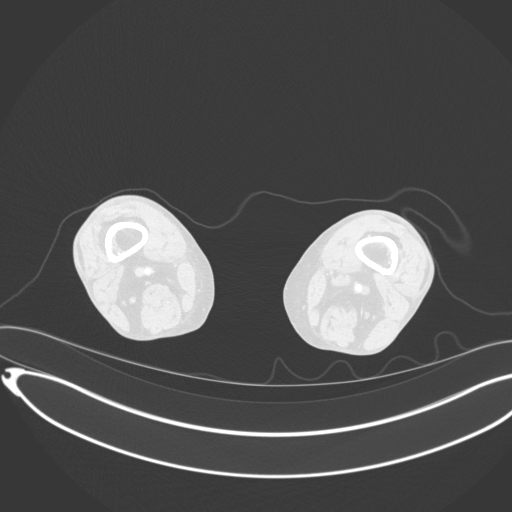
[im 230/261  lung]
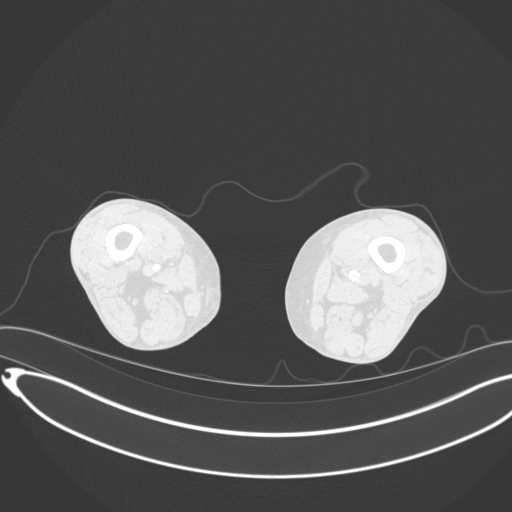
[im 245/261  soft-tissue]
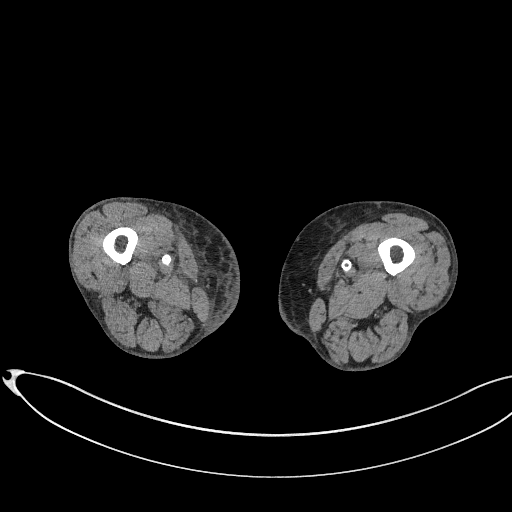
[im 245/261  lung]
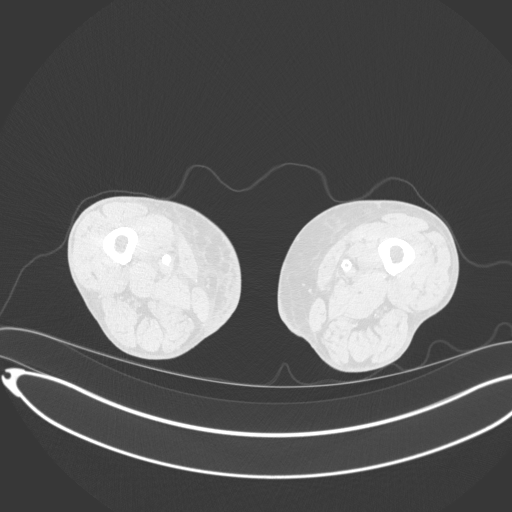

[Series 6: coronal upper · coronal · 0.88mm/px · 1 of 133 slices shown, 2 images]
[im 67/133  soft-tissue]
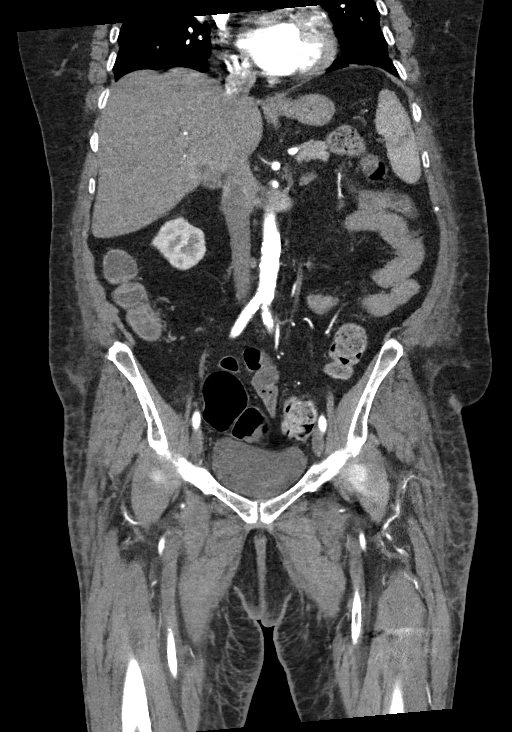
[im 67/133  bone]
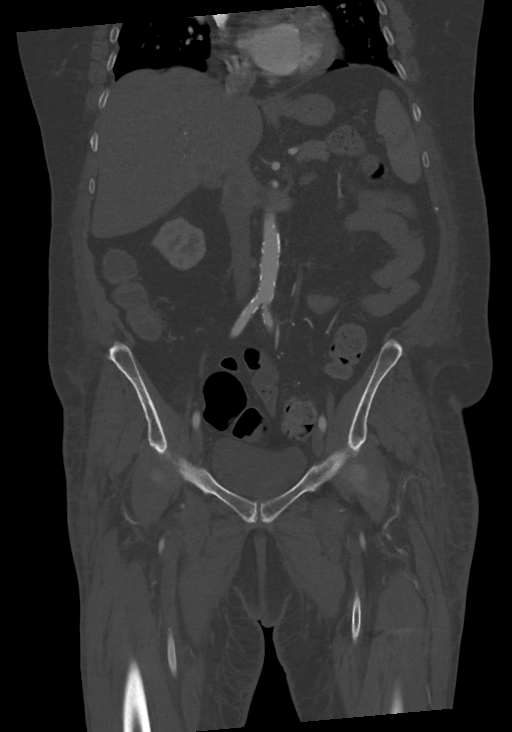

[11 of 46 positions shown; findings below may reference images not displayed]

FINDINGS: VASCULAR

Aorta: Abdominal aorta demonstrates diffuse atherosclerotic
calcifications without aneurysmal dilatation or signs of dissection.

Celiac: Mild atherosclerotic calcification is noted at the origin
without focal stenosis.

SMA: Mild atherosclerotic changes at the origin are noted without
focal stenosis.

Renals: Single renal arteries are noted bilaterally and widely
patent.

IMA: Patent without evidence of aneurysm, dissection, vasculitis or
significant stenosis.

Veins: No specific vein abnormality is noted.

RIGHT Lower Extremity

Inflow: Scattered atherosclerotic changes are noted in the common
iliac artery on the right. The external iliac artery shows no
significant atherosclerotic change and is widely patent. The common
femoral artery is widely patent with changes consistent with the
recent endarterectomy. Mild soft tissues stranding is seen
consistent with the recent surgery. Small foci of air are noted as
well consistent with the recent surgery. The profundus femoral
artery is widely patent.

Outflow: The superficial femoral artery is widely patent and
demonstrates stenting throughout its course. The stent extends to
the proximal aspect of the popliteal artery on the right. Mild
atherosclerotic calcifications and areas of focal narrowing are
noted in the popliteal artery. Popliteal trifurcation is patent with
2 vessel runoff to the level of the ankle primarily via the peroneal
and posterior tibial artery. More distal imaging is somewhat limited
secondary to timing of the contrast bolus.

LEFT Lower Extremity

Inflow: Atherosclerotic calcifications are noted within the common
iliac artery on the left. The external iliac artery and common
femoral artery are patent. Changes consistent with the recent common
femoral endarterectomy are noted. Foci of air are noted in the
subcutaneous tissues. The profundus femoral artery is widely patent.

Outflow: Just beyond the origin of the superficial femoral artery
there is abrupt occlusion identified just above the proximal aspect
of the SFA stents. The stents are occluded throughout their course
in the left thigh reconstitution of the popliteal artery via
muscular branches is noted. Popliteal artery is patent with
three-vessel runoff to the level of the ankle.

Review of the MIP images confirms the above findings.

NON-VASCULAR

Lower chest: Bibasilar atelectatic changes are noted.

Hepatobiliary: No focal liver abnormality is seen. No gallstones,
gallbladder wall thickening, or biliary dilatation.

Pancreas: Unremarkable. No pancreatic ductal dilatation or
surrounding inflammatory changes.

Spleen: Normal in size without focal abnormality.

Adrenals/Urinary Tract: Adrenal glands are within normal limits.
Kidneys demonstrate a normal enhancement pattern bilaterally. No
definitive calculi or obstructive changes are seen. The bladder is
partially distended.

Stomach/Bowel: Mild retained fecal material is noted within the
colon. No obstructive changes are seen. The appendix has been
surgically removed. No obstructive or inflammatory changes of the
small bowel are noted. The stomach is decompressed.

Lymphatic: No significant lymphadenopathy is noted.

Reproductive: Status post hysterectomy. No adnexal masses.

Other: No abdominal wall hernia or abnormality. No abdominopelvic
ascites.

Musculoskeletal: Degenerative changes of the thoracolumbar spine are
seen. Changes of prior rib fractures with healing are noted. L3
compression deformity is noted which appears chronic in nature.
Postsurgical changes in the left femur are noted.
IMPRESSION: VASCULAR

Patent right SFA stent with 2 vessel runoff to the level of the
ankle.

Occluded left superficial femoral stent with reconstitution via
muscular branches of the popliteal artery just below the distal
aspect of the stent. Three-vessel runoff to the left ankle is noted.

Diffuse atherosclerotic change.

NON-VASCULAR

Mild bibasilar atelectasis.

Chronic changes as described above.

## 2018-03-11 MED ORDER — SODIUM BICARBONATE BOLUS VIA INFUSION
INTRAVENOUS | Status: AC
  Administered 2018-03-11: 75 meq via INTRAVENOUS
  Filled 2018-03-11: qty 1

## 2018-03-11 MED ORDER — SODIUM BICARBONATE 8.4 % IV SOLN
INTRAVENOUS | Status: AC
  Administered 2018-03-11: 08:00:00 via INTRAVENOUS
  Filled 2018-03-11: qty 500

## 2018-03-11 MED ORDER — SODIUM CHLORIDE 0.9% IV SOLUTION
Freq: Once | INTRAVENOUS | Status: AC
  Administered 2018-03-11: 08:00:00 via INTRAVENOUS

## 2018-03-11 MED ORDER — IOPAMIDOL (ISOVUE-370) INJECTION 76%
80.0000 mL | Freq: Once | INTRAVENOUS | Status: AC | PRN
  Administered 2018-03-11: 80 mL via INTRAVENOUS

## 2018-03-11 MED ORDER — LACTATED RINGERS IV BOLUS: 500.0000 mL | Freq: Once | INTRAVENOUS | Status: DC

## 2018-03-11 MED ORDER — SODIUM CHLORIDE 0.9% IV SOLUTION: Freq: Once | INTRAVENOUS | Status: AC

## 2018-03-11 NOTE — Progress Notes (Signed)
MD made of Hgb 8.5. Verbal order for pt to receive a carb modified diet at this time and H&H check 03/12/2018 at 0500. Pt updated.   Jurline Folger [employer]

## 2018-03-11 NOTE — Progress Notes (Addendum)
MD notified that pt states "left leg is numb" RN doppler pulses, absent dorsal pedis with doppler, which MD was already aware of. Pt states she is having left groin pain where her hematoma is, hematoma is soft, left groin is still swollen but no open wounds or active bleeding. Pt is pale and is alert but slow to respond. Verbal orders for patient to be NPO, start bicarb drip prior to CT with contrast, CBC with platelets, BMP. First unit of blood started at 0730. MD is currently at bedside. RN will continue to monitor pt closely.   Caelan Branden [employer]

## 2018-03-11 NOTE — Progress Notes (Signed)
RN transported with pt to CT due to blood transfusion running. Pt transported with no complications. VSS  Cheryl Leonard [employer]

## 2018-03-11 NOTE — Progress Notes (Signed)
2 Days Post-Op   Subjective/Chief Complaint: The patient had an uneventful night, she complained of slight numbness in the left lower extremity which was a unusual sensation for her.  She denied any pain in her lower extremities.  She denies any unusual pain in the groins. New hemoglobin this morning was 5.8 which is a drop from 7.3  Objective: Vital signs in last 24 hours: Temp:  [98.4 F (36.9 C)-99.6 F (37.6 C)] 99.6 F (37.6 C) (03/08 0718) Pulse Rate:  [87-98] 87 (03/08 0718) Resp:  [17-18] 17 (03/08 0718) BP: (93-116)/(43-60) 93/50 (03/08 0718) SpO2:  [95 %-100 %] 100 % (03/08 0718)    Intake/Output from previous day: 03/07 0701 - 03/08 0700 In: 1140.9 [P.O.:720; I.V.:420.9] Out: 450 [Urine:450] Intake/Output this shift: No intake/output data recorded.  On clinical examination the patient is alert and oriented x3 and not in acute distress.  Patient's lungs and heart are clear.  The patient's abdomen is soft, positive bowel sounds, nontender.  Bilateral groin incisions have slight ecchymosis on the right and large ecchymosis on the left without any significant change from prior exam.  Lower extremity examination the legs are pale, warm to touch, with positive Doppler signals.  The Doppler signals on the left are monophasic compared to the right which are biphasic.  Lab Results:  Recent Labs    03/09/18 1943 03/10/18 0432 03/10/18 1133 03/11/18 0608  WBC 17.9* 15.2*  --   --   HGB 8.6* 7.3* 7.0* 5.8*  HCT 27.0* 22.3* 21.2* 17.7*  PLT 350 310  --   --    BMET Recent Labs    03/09/18 0405 03/10/18 0432 03/11/18 0608  NA 137 138  --   K 4.9 4.8  --   CL 106 106  --   CO2 22 22  --   GLUCOSE 136* 152*  --   BUN 16 22  --   CREATININE 1.09* 1.55* 1.55*  CALCIUM 9.5 8.8*  --    PT/INR Recent Labs    03/09/18 0405 03/11/18 0609  LABPROT 13.7 14.8  INR 1.1 1.2   ABG No results for input(s): PHART, HCO3 in the last 72 hours.  Invalid input(s): PCO2,  PO2  Studies/Results: No results found.  Anti-infectives: Anti-infectives (From admission, onward)   Start     Dose/Rate Route Frequency Ordered Stop   03/09/18 0700  ceFAZolin (ANCEF) IVPB 2g/100 mL premix  Status:  Discontinued    Note to Pharmacy:  Send with patient to OR   2 g 200 mL/hr over 30 Minutes Intravenous  Once 03/08/18 1604 03/09/18 1848   03/07/18 2345  ceFAZolin (ANCEF) IVPB 2g/100 mL premix     2 g 200 mL/hr over 30 Minutes Intravenous  Once 03/07/18 2331 03/08/18 0850      Assessment/Plan: s/p Procedure(s): ENDARTERECTOMY FEMORAL (Bilateral) ENDARTERECTOMY POPLITEAL AND SFA (Left) I will order a stat CT angiogram of abdomen pelvis with runoff to assess for pseudoaneurysm or slow active bleeding.  The patient will be placed n.p.o. and a bicarb drip will be started prior to her CT angiogram.  New labs will be ordered which will include a CBC with platelets and a BMP.  Patient was explained in detail of her current situation, which she clearly understands.  LOS: 1 day    Cheryl Leonard 03/11/2018

## 2018-03-11 NOTE — Progress Notes (Signed)
Verbal order to hold pt.'s aspirin, plavix, nebivolol, lisnopril, gemfibrozil this morning due to low BP prior to blood transfusion lows 90s-40s and CBG 112-99 and pt is NPO at this time. Pt updated.   Cheryl Leonard [employer]

## 2018-03-12 DIAGNOSIS — Z9889 Other specified postprocedural states: Secondary | ICD-10-CM

## 2018-03-12 DIAGNOSIS — Z95820 Peripheral vascular angioplasty status with implants and grafts: Secondary | ICD-10-CM

## 2018-03-12 DIAGNOSIS — I739 Peripheral vascular disease, unspecified: Secondary | ICD-10-CM

## 2018-03-12 LAB — GLUCOSE, CAPILLARY
Glucose-Capillary: 76 mg/dL (ref 70–99)
Glucose-Capillary: 91 mg/dL (ref 70–99)

## 2018-03-12 LAB — BASIC METABOLIC PANEL
Anion gap: 7 (ref 5–15)
BUN: 21 mg/dL (ref 8–23)
CO2: 23 mmol/L (ref 22–32)
Calcium: 8.3 mg/dL — ABNORMAL LOW (ref 8.9–10.3)
Chloride: 108 mmol/L (ref 98–111)
Creatinine, Ser: 1.09 mg/dL — ABNORMAL HIGH (ref 0.44–1.00)
GFR calc Af Amer: 59 mL/min — ABNORMAL LOW (ref >60)
GFR, EST NON AFRICAN AMERICAN: 51 mL/min — AB (ref >60)
Glucose, Bld: 80 mg/dL (ref 70–99)
POTASSIUM: 4 mmol/L (ref 3.5–5.1)
Sodium: 138 mmol/L (ref 135–145)

## 2018-03-12 LAB — HEMOGLOBIN AND HEMATOCRIT, BLOOD
HCT: 24.3 % — ABNORMAL LOW (ref 36.0–46.0)
Hemoglobin: 8.1 g/dL — ABNORMAL LOW (ref 12.0–15.0)

## 2018-03-12 MED ORDER — FERROUS SULFATE 325 (65 FE) MG PO TABS: 325.0000 mg | ORAL_TABLET | Freq: Every day | ORAL | 3 refills | Status: DC

## 2018-03-12 MED ORDER — HYDROCODONE-ACETAMINOPHEN 5-325 MG PO TABS: 1.0000 | ORAL_TABLET | Freq: Four times a day (QID) | ORAL | 0 refills | Status: DC | PRN

## 2018-03-12 MED ORDER — ZOLPIDEM TARTRATE 5 MG PO TABS: 5.0000 mg | ORAL_TABLET | Freq: Every day | ORAL | 0 refills | Status: DC

## 2018-03-12 MED ORDER — BUPROPION HCL ER (XL) 300 MG PO TB24: 300.0000 mg | ORAL_TABLET | Freq: Every day | ORAL | Status: DC

## 2018-03-12 MED ORDER — DULOXETINE HCL 60 MG PO CPEP: 60.0000 mg | ORAL_CAPSULE | Freq: Two times a day (BID) | ORAL | 3 refills | Status: DC

## 2018-03-12 NOTE — Evaluation (Signed)
Physical Therapy Evaluation Patient Details Name: Cheryl Leonard MRN: [id number] DOB: 1945/06/02 Today's Date: 03/12/2018   History of Present Illness  admitted for acute hospitalization status post R LE angiolplasty with stent placement (3/5) and subsequent femoral (bilat), popliteal/SFA (L) endarterectomies (3/6).  Complicated by L groin hematoma; stable.  Current HgB 8.1  Clinical Impression  Upon evaluation, patient alert and oriented; eager for mobility and upcoming discharge.  Follows commands and demonstrates good effort with mobility; intermittent cuing for safety/decreased impulsivity.  Bilat LE strength and ROM grossly symmetrical and WFL; minimal/no pain reported.  Does endorse baseline neuropathy plantar surfaces bilat LEs, unchanged with this admission/procedure.  Demonstrates ability to complete bed mobility with min assist; sit/stand, basic transfers and gait (40') without assist device, cga/close sup.  Does intermittently reach for walls/furniture at times for external stabilization.  Additional gait trial completed with RW (160'), close sup, improved stability, fluidity and overall safety; visibly more relaxed/comfortable in cadence and overall gait performance.  Do recommend continued use of RW for all mobility at this time (until recovery complete). Patient /husband voiced understanding and agreement. Appears to be at/near baseline for functional mobility performance; no acute PT needs identified at this time.  Patient/family in agreement.  Do encouraged continued mobility throughout remaining hospitalization.  Will complete order at this time; please re-consult should needs change.    Follow Up Recommendations No PT follow up    Equipment Recommendations  (has RW at home)    Recommendations for Other Services       Precautions / Restrictions Precautions Precautions: Fall Restrictions Weight Bearing Restrictions: No      Mobility  Bed Mobility Overal bed mobility:  Needs Assistance Bed Mobility: Supine to Sit     Supine to sit: Min assist     General bed mobility comments: for truncal elevation  Transfers Overall transfer level: Needs assistance   Transfers: Sit to/from Stand Sit to Stand: Min guard;Supervision         General transfer comment: performed with and without RW, cga/close sup; does require UE support for lift off and initial stabilization.  Broad BOS, but no reports of pain wiht movement transition  Ambulation/Gait Ambulation/Gait assistance: Min guard Gait Distance (Feet): 40 Feet Assistive device: None       General Gait Details: broad BOS, intermittently reaching/grabbing for walls/furniture for external stabilization  Stairs            Wheelchair Mobility    Modified Rankin (Stroke Patients Only)       Balance Overall balance assessment: Needs assistance Sitting-balance support: Feet supported;No upper extremity supported Sitting balance-Leahy Scale: Good     Standing balance support: No upper extremity supported Standing balance-Leahy Scale: Fair                               Pertinent Vitals/Pain Pain Assessment: No/denies pain    Home Living Family/patient expects to be discharged to:: Private residence Living Arrangements: Spouse/significant other Available Help at Discharge: Family;Available 24 hours/day Type of Home: House Home Access: Stairs to enter Entrance Stairs-Rails: Right;Left;Can reach both Entrance Stairs-Number of Steps: 4 Home Layout: Able to live on main level with bedroom/bathroom        Prior Function Level of Independence: Independent         Comments: Indep with ADLs, household and community mobilization     Hand Dominance   Dominant Hand: Right    Extremity/Trunk Assessment  Upper Extremity Assessment Upper Extremity Assessment: Overall WFL for tasks assessed    Lower Extremity Assessment Lower Extremity Assessment: Overall WFL for  tasks assessed(grossly at least 4/5 throughout; baseline neuropathy plantar surfaces bilat feet)       Communication   Communication: No difficulties  Cognition Arousal/Alertness: Awake/alert Behavior During Therapy: WFL for tasks assessed/performed Overall Cognitive Status: Within Functional Limits for tasks assessed                                 General Comments: hyperverbal, slightly impulsive at times      General Comments      Exercises Other Exercises Other Exercises: 160' with RW, cga/close sup-improved stability, fluidity and overall safety; visibly more relaxed/comfortable in cadence and overall gait performance.  Do recommend continued use of RW for all mobility at this time. Patient /husband voiced undersatnding and agreement.   Assessment/Plan    PT Assessment Patent does not need any further PT services  PT Problem List Decreased mobility;Decreased balance;Decreased skin integrity       PT Treatment Interventions DME instruction;Gait training;Functional mobility training;Therapeutic activities;Therapeutic exercise;Balance training;Patient/family education    PT Goals (Current goals can be found in the Care Plan section)  Acute Rehab PT Goals Patient Stated Goal: to return home PT Goal Formulation: All assessment and education complete, DC therapy Time For Goal Achievement: 03/12/18 Potential to Achieve Goals: Good    Frequency     Barriers to discharge        Co-evaluation               AM-PAC PT "6 Clicks" Mobility  Outcome Measure Help needed turning from your back to your side while in a flat bed without using bedrails?: None Help needed moving from lying on your back to sitting on the side of a flat bed without using bedrails?: A Little Help needed moving to and from a bed to a chair (including a wheelchair)?: None Help needed standing up from a chair using your arms (e.g., wheelchair or bedside chair)?: A Little Help needed to  walk in hospital room?: A Little Help needed climbing 3-5 steps with a railing? : A Little 6 Click Score: 20    End of Session Equipment Utilized During Treatment: Gait belt Activity Tolerance: Patient tolerated treatment well Patient left: (preferred to sit on BSC as chair surface (LEs propped on bed).  RN informed/aware, to check frequently) Nurse Communication: Mobility status PT Visit Diagnosis: Difficulty in walking, not elsewhere classified (R26.2)    Time: 8978-4784 PT Time Calculation (min) (ACUTE ONLY): 29 min   Charges:   PT Evaluation $PT Eval Moderate Complexity: 1 Mod PT Treatments $Gait Training: 8-22 mins        Bryella Diviney H. Manson Passey, PT, DPT, NCS 03/12/18, 11:21 AM [phone]

## 2018-03-12 NOTE — Discharge Instructions (Signed)
Vascular Surgery Discharge Instructions 1) You may shower. Keep incision clean and dry. 2) Warm compresses to hematoma. Elevate your leg as well. 3) No driving until you follow up in our office. 4) No driving while on pain medication.

## 2018-03-12 NOTE — Progress Notes (Signed)
Pt discharged per MD order. IV removed. Discharge instructions reviewed with pt. All questions answered to pt satisfaction. Prescription given to pt. Pt taken to car in wheelchair by staff.

## 2018-03-12 NOTE — Discharge Summary (Signed)
[facility]    Discharge Summary   Patient ID:  Cheryl Leonard MRN: [id number] DOB/AGE: 1945/12/21 73 y.o.  Admit date: 03/08/2018 Discharge date: 03/12/2018 Date of Surgery: 03/09/2018 Surgeon: Surgeon(s): Wyn Quaker Marlow Baars, MD  Admission Diagnosis: RT lower extremity angio    PAD  Discharge Diagnoses:  RT lower extremity angio    PAD  Secondary Diagnoses: Past Medical History:  Diagnosis Date  . Anxiety   . Arthritis   . Cataracts, both eyes   . GERD (gastroesophageal reflux disease)   . Gout   . History of fracture of patella    right knee  . History of positive PPD    Patient always shows positive  . Hyperlipidemia   . Hypertension   . Hypothyroidism   . Lichen sclerosus 12/30/2013   of vulva  . Metatarsal fracture   . Neuropathy   . Peripheral vascular disease (HCC)   . Polyneuropathy    numbness and tingling in feet and toes  . Renal insufficiency   . Type 2 diabetes mellitus, uncontrolled (HCC)    Procedure(s): ENDARTERECTOMY FEMORAL ENDARTERECTOMY POPLITEAL AND SFA  Discharged Condition: good  HPI:  The patient is a 73 year old female who presented with a past medical history of peripheral artery disease of the right lower extremity with claudication.  On March 08, 2018, the patient underwent:            1. Ultrasound guidance for vascular access left femoral artery 2. Catheter placement into right common femoral artery from left femoral approach 3. Aortogram and selective right lower extremity angiogram 4. Percutaneous transluminal angioplasty of the right proximal posterior tibial artery with 3 mm diameter by 8 cm angioplasty balloon 5.  Percutaneous transluminal angioplasty of the entire right SFA and above-knee popliteal artery with two 5 mm diameter by 22 cm length Lutonix drug-coated angioplasty balloons             6.  Viabahn stent placement of the right SFA with 6 mm  diameter by 25 cm length stent for greater than 50% residual stenosis after angioplasty             7.  Percutaneous transluminal angioplasty of the right common femoral artery with 6 mm diameter by 6 cm length Lutonix drug-coated angioplasty balloon 8. StarClose closure device left femoral artery  Completion imaging demonstrated no greater than 10% residual stenosis in the SFA and popliteal arteries after these interventions.  There was mild to moderate residual right common femoral disease approaching 50% but this was in a poor location for a stent and there appeared to be much more brisk flow distally at this point so this was left for now.  Any further intervention here would require femoral endarterectomy.  The patient tolerated the procedure well and was transferred from the endovascular suite to the surgical floor without complication.  The patient's planned procedure was unremarkable.  On March 09, 2018, the patient underwent: 1. Left common femoral and superficial femoral artery endarterectomies 2.   Right common femoral, profunda femoris, and superficial femoral artery endarterectomies 3.   Left SFA/popliteal Fogarty embolectomy  The patient tolerated the procedure well was transferred from the operating room to the recovery room then the surgical floor without complication.  The patient's night of surgery was unremarkable.  Due to a change in her lower extremity physical exam the patient underwent a CTA Aorto-Bifem (03/11/18) and was found to have Patent right SFA stent with 2 vessel runoff to  the level of the Ankle. Occluded left superficial femoral stent with reconstitution via muscular branches of the popliteal artery just below the distal aspect of the stent. Three-vessel runoff to the left ankle is noted. Diffuse atherosclerotic change.  The patient also received two units of packed red blood cells on 03/11/18 drop in hemoglobin.  The patient responded appropriately with a  hemoglobin of 8.5 after transfusion.  Upon discharge the patient was tolerating a regular diet, urinating independently, her pain was controlled with the use of p.o. pain medication and she was ambulating independently.  Hospital Course:  Cheryl Leonard is a 73 y.o. female is S/P:  Procedure(s): ENDARTERECTOMY FEMORAL ENDARTERECTOMY POPLITEAL AND SFA  Extubated: POD # 0  Physical exam:  A&Ox3, NAD CV: RRR Pulm: CTA bilateraly Abdomen: Soft, Non-tender, Non-distended, (+) bowel sounds Groin: Bilateral OR dressings removed. Incisions are clean, dry and intact. No signs of infection,  drainage, dehiscence. Vascular: Doppler signals.  The Doppler signals on the left are monophasic compared to the right which are biphasic. Neuro: Motor/sensory is intact to the bilateral legs.  Post-op wounds clean, dry, intact or healing well  Pt. Ambulating, voiding and taking PO diet without difficulty.  Pt pain controlled with PO pain meds.  Labs as below  Complications:none  Consults: None  Significant Diagnostic Studies: CBC Lab Results  Component Value Date   WBC 11.6 (H) 03/11/2018   HGB 8.1 (L) 03/12/2018   HCT 24.3 (L) 03/12/2018   MCV 88.7 03/11/2018   PLT 233 03/11/2018   BMET    Component Value Date/Time   NA 138 03/12/2018 0442   K 4.0 03/12/2018 0442   CL 108 03/12/2018 0442   CO2 23 03/12/2018 0442   GLUCOSE 80 03/12/2018 0442   BUN 21 03/12/2018 0442   CREATININE 1.09 (H) 03/12/2018 0442   CALCIUM 8.3 (L) 03/12/2018 0442   GFRNONAA 51 (L) 03/12/2018 0442   GFRAA 59 (L) 03/12/2018 0442   COAG Lab Results  Component Value Date   INR 1.2 03/11/2018   INR 1.1 03/09/2018   INR 0.98 10/29/2015   Disposition:  Discharge to :Home  Allergies as of 03/12/2018   No Known Allergies     Medication List    TAKE these medications   aspirin EC 81 MG tablet Take 81 mg by mouth daily.   atorvastatin 20 MG tablet Commonly known as:  LIPITOR Take 20 mg by  mouth at bedtime.   B-D UF III MINI PEN NEEDLES 31G X 5 MM Misc Generic drug:  Insulin Pen Needle once daily.   buPROPion 150 MG 24 hr tablet Commonly known as:  WELLBUTRIN XL Take 150 mg by mouth daily. Take with 300 mg to equal 450 mg once daily What changed:  Another medication with the same name was changed. Make sure you understand how and when to take each.   buPROPion 300 MG 24 hr tablet Commonly known as:  WELLBUTRIN XL Take 1 tablet (300 mg total) by mouth daily. Start taking on:  March 13, 2018 What changed:  additional instructions   Bystolic 5 MG tablet Generic drug:  nebivolol Take 5 mg by mouth daily.   chlorhexidine 0.12 % solution Commonly known as:  PERIDEX RINSE FOR 30 SECONDS WITH 0.5 OZ TWICE A DAY   cholecalciferol 1000 units tablet Commonly known as:  VITAMIN D Take 1,000 Units by mouth 2 (two) times daily.   CINNAMON PO Take 1 tablet by mouth daily.   clopidogrel 75 MG tablet  Commonly known as:  Plavix Take 1 tablet (75 mg total) by mouth daily.   CORAL CALCIUM PO Take 1 tablet by mouth daily.   Denta 5000 Plus 1.1 % Crea dental cream Generic drug:  sodium fluoride 2 (two) times daily. as directed   DULoxetine 60 MG capsule Commonly known as:  CYMBALTA Take 1 capsule (60 mg total) by mouth 2 (two) times daily. What changed:  when to take this   ferrous sulfate 325 (65 FE) MG tablet Take 1 tablet (325 mg total) by mouth daily with breakfast.   Fish Oil 1000 MG Caps Take 1,000 mg by mouth daily.   gemfibrozil 600 MG tablet Commonly known as:  LOPID Take 600 mg by mouth 2 (two) times daily.   HYDROcodone-acetaminophen 5-325 MG tablet Commonly known as:  NORCO/VICODIN Take 1-2 tablets by mouth every 6 (six) hours as needed for moderate pain or severe pain.   levothyroxine 100 MCG tablet Commonly known as:  SYNTHROID, LEVOTHROID Take 100 mcg by mouth daily.   lisinopril 10 MG tablet Commonly known as:  PRINIVIL,ZESTRIL Take 10 mg  by mouth daily.   Magnesium 500 MG Tabs Take 500 mg by mouth daily.   metFORMIN 1000 MG tablet Commonly known as:  GLUCOPHAGE Take 1,000 mg by mouth 2 (two) times daily.   multivitamin with minerals Tabs tablet Take 1 tablet by mouth daily.   mupirocin ointment 2 % Commonly known as:  BACTROBAN APPLY TO AFFECTED AREA 3 TIMES A DAY   pantoprazole 40 MG tablet Commonly known as:  PROTONIX Take 40 mg by mouth daily.   Prolia 60 MG/ML Soln injection Generic drug:  denosumab Inject into the skin.   ranitidine 300 MG capsule Commonly known as:  ZANTAC Take 300 mg by mouth at bedtime. What changed:  Another medication with the same name was removed. Continue taking this medication, and follow the directions you see here.   Evaristo Buryresiba FlexTouch 200 UNIT/ML Sopn Generic drug:  Insulin Degludec INJECT 30 UNITS SUB-Q NIGHTLY   vitamin B-12 1000 MCG tablet Commonly known as:  CYANOCOBALAMIN Take 1,000 mcg by mouth daily.   vitamin E 400 UNIT capsule Take 400 Units by mouth daily.   Vitron-C 65-125 MG Tabs Generic drug:  Iron-Vitamin C Take 1 tablet by mouth daily.   zolpidem 5 MG tablet Commonly known as:  AMBIEN Take 1 tablet (5 mg total) by mouth at bedtime. What changed:    medication strength  how much to take      Verbal and written Discharge instructions given to the patient. Wound care per Discharge AVS Follow-up Information    Dew, Marlow BaarsJason S, MD Follow up in 1 week(s).   Specialties:  Vascular Surgery, Radiology, Interventional Cardiology Why:  Can see Dew or [facility]. First post-op incision check. Hematoma check. No studies. Contact information: [address] [place] [address] [phone]          Signed: Tonette LedererKIMBERLY A Briggette Najarian, PA-C  03/12/2018, 1:40 PM

## 2018-03-13 LAB — BPAM RBC
Blood Product Expiration Date: 202003142359
Blood Product Expiration Date: 202004022359
Blood Product Expiration Date: 202004032359
Blood Product Expiration Date: 202004042359
Blood Product Expiration Date: 202004062359
Blood Product Expiration Date: 202004092359
ISSUE DATE / TIME: 202003080725
ISSUE DATE / TIME: 202003081008
Unit Type and Rh: 1700
Unit Type and Rh: 1700
Unit Type and Rh: 1700
Unit Type and Rh: 7300
Unit Type and Rh: 7300
Unit Type and Rh: 7300

## 2018-03-13 LAB — TYPE AND SCREEN
ABO/RH(D): B NEG
Antibody Screen: NEGATIVE
UNIT DIVISION: 0
UNIT DIVISION: 0
Unit division: 0
Unit division: 0
Unit division: 0
Unit division: 0

## 2018-03-13 LAB — SURGICAL PATHOLOGY

## 2018-03-16 ENCOUNTER — Encounter (INDEPENDENT_AMBULATORY_CARE_PROVIDER_SITE_OTHER): Payer: Self-pay

## 2018-03-18 ENCOUNTER — Encounter (INDEPENDENT_AMBULATORY_CARE_PROVIDER_SITE_OTHER): Payer: Self-pay

## 2018-03-20 ENCOUNTER — Ambulatory Visit (INDEPENDENT_AMBULATORY_CARE_PROVIDER_SITE_OTHER): Payer: Medicare Other | Admitting: Nurse Practitioner

## 2018-03-20 ENCOUNTER — Other Ambulatory Visit: Payer: Self-pay

## 2018-03-20 ENCOUNTER — Encounter (INDEPENDENT_AMBULATORY_CARE_PROVIDER_SITE_OTHER): Payer: Self-pay | Admitting: Nurse Practitioner

## 2018-03-20 VITALS — BP 118/75 | HR 80 | Resp 10 | Ht 65.0 in | Wt 162.0 lb

## 2018-03-20 DIAGNOSIS — Z87891 Personal history of nicotine dependence: Secondary | ICD-10-CM

## 2018-03-20 DIAGNOSIS — E785 Hyperlipidemia, unspecified: Secondary | ICD-10-CM

## 2018-03-20 DIAGNOSIS — Z7984 Long term (current) use of oral hypoglycemic drugs: Secondary | ICD-10-CM

## 2018-03-20 DIAGNOSIS — T8149XA Infection following a procedure, other surgical site, initial encounter: Secondary | ICD-10-CM

## 2018-03-20 DIAGNOSIS — I10 Essential (primary) hypertension: Secondary | ICD-10-CM

## 2018-03-20 DIAGNOSIS — E1165 Type 2 diabetes mellitus with hyperglycemia: Secondary | ICD-10-CM

## 2018-03-20 DIAGNOSIS — I70219 Atherosclerosis of native arteries of extremities with intermittent claudication, unspecified extremity: Secondary | ICD-10-CM

## 2018-03-20 DIAGNOSIS — Z79899 Other long term (current) drug therapy: Secondary | ICD-10-CM

## 2018-03-20 MED ORDER — CEPHALEXIN 500 MG PO CAPS: 500.0000 mg | ORAL_CAPSULE | Freq: Three times a day (TID) | ORAL | 0 refills | Status: DC

## 2018-03-21 ENCOUNTER — Encounter (INDEPENDENT_AMBULATORY_CARE_PROVIDER_SITE_OTHER): Payer: Self-pay | Admitting: Nurse Practitioner

## 2018-03-21 DIAGNOSIS — T8149XA Infection following a procedure, other surgical site, initial encounter: Secondary | ICD-10-CM | POA: Insufficient documentation

## 2018-03-21 NOTE — Progress Notes (Signed)
SUBJECTIVE:  Patient ID: Cheryl Leonard, female    DOB: [birth year], 73 y.o.   MRN: [id number] Chief Complaint  Patient presents with  . Follow-up    HPI  Cheryl Leonard is a 73 y.o. female presents today for evaluation after bilateral femoral endarterectomy.  The patient endorses some soreness however she is able to ambulate without great issue.  However, she also endorses continued claudication in her left lower extremity.  Patient also has swelling of her lower extremities however today it is minimal.  The patient's right groin is clean dry and intact.  No evidence of dehiscence.  The left groin however is wet, boggy and erythematous.  There is a significant amount of fibrinous exudate present.  There is also drainage.  Small evidence of dehiscence at this time.  Patient denies any fever, chills, nausea, vomiting or diarrhea.  She denies any chest pain or shortness of breath.  Each groin wound is approximately 2 inches long.  Past Medical History:  Diagnosis Date  . Anxiety   . Arthritis   . Cataracts, both eyes   . GERD (gastroesophageal reflux disease)   . Gout   . History of fracture of patella    right knee  . History of positive PPD    Patient always shows positive  . Hyperlipidemia   . Hypertension   . Hypothyroidism   . Lichen sclerosus 12/30/2013   of vulva  . Metatarsal fracture   . Neuropathy   . Peripheral vascular disease (HCC)   . Polyneuropathy    numbness and tingling in feet and toes  . Renal insufficiency   . Type 2 diabetes mellitus, uncontrolled (HCC)     Past Surgical History:  Procedure Laterality Date  . APPENDECTOMY    . BREAST REDUCTION SURGERY  2001  . CATARACT EXTRACTION    . CESAREAN SECTION  1976  . COLONOSCOPY  03/05/2013   Nml - due for repeat 03/06/2018  . DILATION AND CURETTAGE OF UTERUS  1989  . ENDARTERECTOMY FEMORAL Bilateral 03/09/2018   Procedure: ENDARTERECTOMY FEMORAL;  Surgeon: Annice Needy, MD;  Location: [facility];   Service: Vascular;  Laterality: Bilateral;  . ENDARTERECTOMY POPLITEAL Left 03/09/2018   Procedure: ENDARTERECTOMY POPLITEAL AND SFA;  Surgeon: Annice Needy, MD;  Location: [facility];  Service: Vascular;  Laterality: Left;  . ESOPHAGOGASTRODUODENOSCOPY  03/05/2013  . EYE SURGERY    . Eyelid Surgery  2012  . INTRAMEDULLARY (IM) NAIL INTERTROCHANTERIC Left 10/30/2015   Procedure: INTRAMEDULLARY (IM) NAIL INTERTROCHANTRIC ;  Surgeon: Kennedy Bucker, MD;  Location: [facility];  Service: Orthopedics;  Laterality: Left;  . LAPAROSCOPIC HYSTERECTOMY  2000   total  . LOWER EXTREMITY ANGIOGRAPHY Left 03/08/2017   Procedure: LOWER EXTREMITY ANGIOGRAPHY;  Surgeon: Annice Needy, MD;  Location: [facility];  Service: Cardiovascular;  Laterality: Left;  . LOWER EXTREMITY ANGIOGRAPHY Left 10/30/2017   Procedure: LOWER EXTREMITY ANGIOGRAPHY;  Surgeon: Annice Needy, MD;  Location: [facility];  Service: Cardiovascular;  Laterality: Left;  . LOWER EXTREMITY ANGIOGRAPHY Right 03/08/2018   Procedure: LOWER EXTREMITY ANGIOGRAPHY;  Surgeon: Annice Needy, MD;  Location: [facility];  Service: Cardiovascular;  Laterality: Right;  . PERIPHERAL VASCULAR INTERVENTION  03/08/2018   Procedure: PERIPHERAL VASCULAR INTERVENTION;  Surgeon: Annice Needy, MD;  Location: [facility];  Service: Cardiovascular;;  . REDUCTION MAMMAPLASTY  1997    Social History   Socioeconomic History  . Marital status: Married  Spouse name: Not on file  . Number of children: Not on file  . Years of education: Not on file  . Highest education level: Not on file  Occupational History  . Not on file  Social Needs  . Financial resource strain: Not on file  . Food insecurity:    Worry: Not on file    Inability: Not on file  . Transportation needs:    Medical: Not on file    Non-medical: Not on file  Tobacco Use  . Smoking status: Former Smoker    Packs/day: 1.00    Years: 20.00    Pack years: 20.00    Types:  Cigarettes    Last attempt to quit: 03/07/1996    Years since quitting: 22.0  . Smokeless tobacco: Never Used  . Tobacco comment: started smoking at age 33  Substance and Sexual Activity  . Alcohol use: No    Alcohol/week: 0.0 standard drinks  . Drug use: No  . Sexual activity: Yes    Partners: Male    Birth control/protection: Surgical  Lifestyle  . Physical activity:    Days per week: Not on file    Minutes per session: Not on file  . Stress: Not on file  Relationships  . Social connections:    Talks on phone: Not on file    Gets together: Not on file    Attends religious service: Not on file    Active member of club or organization: Not on file    Attends meetings of clubs or organizations: Not on file    Relationship status: Not on file  . Intimate partner violence:    Fear of current or ex partner: Not on file    Emotionally abused: Not on file    Physically abused: Not on file    Forced sexual activity: Not on file  Other Topics Concern  . Not on file  Social History Narrative  . Not on file    Family History  Problem Relation Age of Onset  . Coronary artery disease Father   . Heart attack Father   . Coronary artery disease Mother   . Heart attack Mother   . Ovarian cancer Sister 99       sister had hormonal therapy for IVF txs-which increased risk factor for ovarian cancer  . Breast cancer Neg Hx     No Known Allergies   Review of Systems   Review of Systems: Negative Unless Checked Constitutional: Weight loss  Fever  Chills Cardiac: Chest pain    Atrial Fibrillation  Palpitations   Shortness of breath when laying flat   Shortness of breath with exertion. Shortness of breath at rest Vascular:  Pain in legs with walking   Pain in legs with standing Pain in legs when laying flat   Claudication    Pain in feet when laying flat    History of DVT   Phlebitis   Swelling in legs   Varicose veins   Non-healing ulcers  Pulmonary:   Uses home oxygen   Productive cough   Hemoptysis   Wheeze  COPD   Asthma Neurologic:  Dizziness   Seizures  Blackouts History of stroke   History of TIA  Aphasia   Temporary Blindness   Weakness or numbness in arm   Weakness or numbness in leg Musculoskeletal:   Joint swelling   Joint pain   Low back pain   History of Knee Replacement Arthritis back Surgeries   Spinal  Stenosis    Hematologic:  [] Easy bruising  [] Easy bleeding   [] Hypercoagulable state   [] Anemic Gastrointestinal:  [] Diarrhea   [] Vomiting  [] Gastroesophageal reflux/heartburn   [] Difficulty swallowing. [] Abdominal pain Genitourinary:  [] Chronic kidney disease   [] Difficult urination  [] Anuric   [] Blood in urine [] Frequent urination  [] Burning with urination   [] Hematuria Skin:  [] Rashes   [] Ulcers [] Wounds Psychological:  [] History of anxiety   []  History of major depression  []  Memory Difficulties      OBJECTIVE:   Physical Exam  BP 118/75 (BP Location: Left Arm, Patient Position: Sitting, Cuff Size: Small)   Pulse 80   Resp 10   Ht 5\' 5"  (1.651 m)   Wt 162 lb (73.5 kg)   BMI 26.96 kg/m   Gen: WD/WN, NAD Head: [place]/AT, No temporalis wasting.  Ear/Nose/Throat: Hearing grossly intact, nares w/o erythema or drainage Eyes: PER, EOMI, sclera nonicteric.  Neck: Supple, no masses.  No JVD.  Pulmonary:  Good air movement, no use of accessory muscles.  Cardiac: RRR Vascular:  +1 edema bilaterally Vessel Right Left  Radial Palpable Palpable   Gastrointestinal: soft, non-distended. No guarding/no peritoneal signs.  Musculoskeletal: M/S 5/5 throughout.  No deformity or atrophy.  Neurologic: Pain and light touch intact in extremities.  Symmetrical.  Speech is fluent. Motor exam as listed above. Psychiatric: Judgment intact, Mood & affect appropriate for pt's clinical situation. Dermatologic:  See incision description in HPI Lymph : No Cervical lymphadenopathy, no  lichenification or skin changes of chronic lymphedema.       ASSESSMENT AND PLAN:  1. 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  We will obtain an ABI when she returns for follow-up visit.  She is also concerned about carotid artery sclerosis due to her history of peripheral arterial disease so we will also check a carotid duplex as well. 2. Essential hypertension Continue antihypertensive medications as already ordered, these medications have been reviewed and there are no changes at this time.   3. Hyperlipidemia, unspecified hyperlipidemia type Continue statin as ordered and reviewed, no changes at this time   4. Uncontrolled type 2 diabetes mellitus with hyperglycemia (HCC) Continue hypoglycemic medications as already ordered, these medications have been reviewed and there are no changes at this time.  Hgb A1C to be monitored as already arranged by primary service  5. Postoperative wound infection Patient's right groin is healing well.  It is clean dry and intact.  No dehiscence present.  The left groin however is wet, boggy and has excessive amount of fibrinous exudate throughout the wound.  Showing a small open area not quite dehiscence at this point, however it is not fully clear due to the copious amount of exudate.  I have placed a wet-to-dry dressing over the fibrinous exudate area.  We will also try to obtain home health for wound care.  We will give antibiotics it is attempt to clear infection and dry wound.  Patient return in 1 week for wound evaluation. - cephALEXin (KEFLEX) 500 MG capsule; Take 1 capsule (500 mg total) by mouth 3 (three) times daily.  Dispense: 21 capsule; Refill: 0     Current Outpatient Medications on File Prior to Visit  Medication Sig Dispense Refill  . aspirin EC 81 MG tablet Take 81 mg by mouth daily.     [name]. buPROPion (WELLBUTRIN XL) 150  MG 24 hr tablet Take 150 mg by mouth daily. Take with  300 mg to equal 450 mg once daily    . buPROPion (WELLBUTRIN XL) 300 MG 24 hr tablet Take 1 tablet (300 mg total) by mouth daily.    [name] BYSTOLIC 5 MG tablet Take 5 mg by mouth daily.  2  . chlorhexidine (PERIDEX) 0.12 % solution RINSE FOR 30 SECONDS WITH 0.5 OZ TWICE A DAY  0  . cholecalciferol (VITAMIN D) 1000 units tablet Take 1,000 Units by mouth 2 (two) times daily.    [name] CINNAMON PO Take 1 tablet by mouth daily.    . clopidogrel (PLAVIX) 75 MG tablet Take 1 tablet (75 mg total) by mouth daily. 30 tablet 11  . CORAL CALCIUM PO Take 1 tablet by mouth daily.    [name] denosumab (PROLIA) 60 MG/ML SOLN injection Inject into the skin.    . DENTA 5000 PLUS 1.1 % CREA dental cream 2 (two) times daily. as directed  99  . DULoxetine (CYMBALTA) 60 MG capsule Take 1 capsule (60 mg total) by mouth 2 (two) times daily.  3  . ferrous sulfate 325 (65 FE) MG tablet Take 1 tablet (325 mg total) by mouth daily with breakfast. 30 tablet 3  . HYDROcodone-acetaminophen (NORCO/VICODIN) 5-325 MG tablet Take 1-2 tablets by mouth every 6 (six) hours as needed for moderate pain or severe pain. 56 tablet 0  . Insulin Pen Needle (B-D UF III MINI PEN NEEDLES) 31G X 5 MM MISC once daily.    . Iron-Vitamin C (VITRON-C) 65-125 MG TABS Take 1 tablet by mouth daily.     [name] levothyroxine (SYNTHROID, LEVOTHROID) 100 MCG tablet Take 100 mcg by mouth daily.  3  . lisinopril (PRINIVIL,ZESTRIL) 10 MG tablet Take 10 mg by mouth daily.     . Magnesium 500 MG TABS Take 500 mg by mouth daily.    . metFORMIN (GLUCOPHAGE) 1000 MG tablet Take 1,000 mg by mouth 2 (two) times daily.    . Multiple Vitamin (MULTIVITAMIN WITH MINERALS) TABS tablet Take 1 tablet by mouth daily.    . mupirocin ointment (BACTROBAN) 2 % APPLY TO AFFECTED AREA 3 TIMES A DAY  0  . Omega-3 Fatty Acids (FISH OIL) 1000 MG CAPS Take 1,000 mg by mouth daily.    . pantoprazole (PROTONIX) 40 MG tablet Take 40 mg by mouth  daily.     . ranitidine (ZANTAC) 300 MG capsule Take 300 mg by mouth at bedtime.  1  . rosuvastatin (CRESTOR) 20 MG tablet Take 20 mg by mouth daily.    . TRESIBA FLEXTOUCH 200 UNIT/ML SOPN INJECT 30 UNITS SUB-Q NIGHTLY  5  . vitamin B-12 (CYANOCOBALAMIN) 1000 MCG tablet Take 1,000 mcg by mouth daily.    . vitamin E 400 UNIT capsule Take 400 Units by mouth daily.    [name] zolpidem (AMBIEN) 5 MG tablet Take 1 tablet (5 mg total) by mouth at bedtime. 30 tablet 0  . atorvastatin (LIPITOR) 20 MG tablet Take 20 mg by mouth at bedtime.  3  . gemfibrozil (LOPID) 600 MG tablet Take 600 mg by mouth 2 (two) times daily.  3  . VASCEPA 1 g CAPS Take 1 capsule by mouth 2 (two) times daily.     Current Facility-Administered Medications on File Prior to Visit  Medication Dose Route Frequency Provider Last Rate Last Dose  . aflibercept (EYLEA) SOLN 2 mg  2 mg Intravitreal  [name], MD   2 mg at 06/27/17 1307  . aflibercept (EYLEA) SOLN 2 mg  2 mg Intravitreal  [name], MD   2 mg at 07/26/17 1702  . aflibercept (EYLEA) SOLN 2 mg  2 mg Intravitreal  [name], MD   2 mg at 09/06/17 1700  . aflibercept (EYLEA) SOLN 2 mg  2 mg Intravitreal  [name], MD   2 mg at 11/01/17 1550  . aflibercept (EYLEA) SOLN 2 mg  2 mg Intravitreal  [name], MD   2 mg at 01/03/18 1416  . Bevacizumab (AVASTIN) SOLN 1.25 mg  1.25 mg Intravitreal  [name], MD   1.25 mg at 02/10/17 0953  . Bevacizumab (AVASTIN) SOLN 1.25 mg  1.25 mg Intravitreal  [name], MD   1.25 mg at 03/13/17 1717  . Bevacizumab (AVASTIN) SOLN 1.25 mg  1.25 mg Intravitreal  [name], MD   1.25 mg at 04/11/17 1626  . Bevacizumab (AVASTIN) SOLN 1.25 mg  1.25 mg Intravitreal  [name], MD   1.25 mg at 02/12/18 2154  . insulin aspart (novoLOG) injection 0-15 Units  0-15 Units Subcutaneous TID WC [name], [name], PA-C      . insulin aspart (novoLOG) injection 0-5 Units  0-5 Units Subcutaneous QHS [name], [name],  PA-C        There are no Patient Instructions on file for this visit. No follow-ups on file.   Georgiana Spinner, NP  This note was completed with Office manager.  Any errors are purely unintentional.

## 2018-03-23 ENCOUNTER — Telehealth (INDEPENDENT_AMBULATORY_CARE_PROVIDER_SITE_OTHER): Payer: Self-pay | Admitting: Nurse Practitioner

## 2018-03-23 NOTE — Telephone Encounter (Signed)
Amanda-Nurse with [facility], calling requesting verbal order for PT due to patient being weak and a stumble in gait. Please advise. AS, CMA

## 2018-03-25 NOTE — Progress Notes (Signed)
[facility] - Clinic Note  03/26/2018     CHIEF COMPLAINT Patient presents for Retina Follow Up   HISTORY OF PRESENT ILLNESS: Cheryl Leonard is a 73 y.o. female who presents to the clinic today for:   HPI    Retina Follow Up    Patient presents with  Other.  In right eye.  Duration of 6 weeks.  Since onset it is gradually worsening.  I, the attending physician,  performed the HPI with the patient and updated documentation appropriately.          Comments    Retina follow up for CME OD, Patient says she is having trouble reading. Closes the right eye to read with the left.        Last edited by [name], MD on 03/26/2018 12:59 PM. (History)    pt just had both femoral arteries cleaned out, she states the left leg incision is open and leaking, her next appt with her dr is Weds, pt states she is on antibiotics, pt states her right eye is more blurry and she cannot read unless she closes it   Referring physician: Danella Penton, MD [address] [facility] [address] [facility] [place], [place] [address]  HISTORICAL INFORMATION:   Selected notes from the MEDICAL RECORD NUMBER Referred by Dr. Senaida Ores for concern of DME OD;  LEE- 02.05.19 [BCVA OD: 20/70-1 OS: 20/30-1] Ocular Hx-  PMH- DM Lab Results  Component Value Date   HGBA1C 5.9 (H) 10/29/2015       CURRENT MEDICATIONS: No current outpatient medications on file. (Ophthalmic Drugs)   Current Facility-Administered Medications (Ophthalmic Drugs)  Medication Route  . aflibercept (EYLEA) SOLN 2 mg Intravitreal  . aflibercept (EYLEA) SOLN 2 mg Intravitreal  . aflibercept (EYLEA) SOLN 2 mg Intravitreal  . aflibercept (EYLEA) SOLN 2 mg Intravitreal  . aflibercept (EYLEA) SOLN 2 mg Intravitreal   Current Outpatient Medications (Other)  Medication Sig  . aspirin EC 81 MG tablet Take 81 mg by mouth daily.   [name] atorvastatin (LIPITOR) 20 MG tablet Take 20 mg by mouth at bedtime.  [name]  buPROPion (WELLBUTRIN XL) 150 MG 24 hr tablet Take 150 mg by mouth daily. Take with 300 mg to equal 450 mg once daily  . buPROPion (WELLBUTRIN XL) 300 MG 24 hr tablet Take 1 tablet (300 mg total) by mouth daily.  [name] BYSTOLIC 5 MG tablet Take 5 mg by mouth daily.  . cephALEXin (KEFLEX) 500 MG capsule Take 1 capsule (500 mg total) by mouth 3 (three) times daily.  . chlorhexidine (PERIDEX) 0.12 % solution RINSE FOR 30 SECONDS WITH 0.5 OZ TWICE A DAY  . cholecalciferol (VITAMIN D) 1000 units tablet Take 1,000 Units by mouth 2 (two) times daily.  [name] CINNAMON PO Take 1 tablet by mouth daily.  . clopidogrel (PLAVIX) 75 MG tablet Take 1 tablet (75 mg total) by mouth daily.  [name] CORAL CALCIUM PO Take 1 tablet by mouth daily.  [name] denosumab (PROLIA) 60 MG/ML SOLN injection Inject into the skin.  . DENTA 5000 PLUS 1.1 % CREA dental cream 2 (two) times daily. as directed  . DULoxetine (CYMBALTA) 60 MG capsule Take 1 capsule (60 mg total) by mouth 2 (two) times daily.  . ferrous sulfate 325 (65 FE) MG tablet Take 1 tablet (325 mg total) by mouth daily with breakfast.  . gemfibrozil (LOPID) 600 MG tablet Take 600 mg by mouth 2 (two) times daily.  [name] HYDROcodone-acetaminophen (NORCO/VICODIN)  5-325 MG tablet Take 1-2 tablets by mouth every 6 (six) hours as needed for moderate pain or severe pain.  . Insulin Pen Needle (B-D UF III MINI PEN NEEDLES) 31G X 5 MM MISC once daily.  . Iron-Vitamin C (VITRON-C) 65-125 MG TABS Take 1 tablet by mouth daily.   [name] levothyroxine (SYNTHROID, LEVOTHROID) 100 MCG tablet Take 100 mcg by mouth daily.  [name] lisinopril (PRINIVIL,ZESTRIL) 10 MG tablet Take 10 mg by mouth daily.   . Magnesium 500 MG TABS Take 500 mg by mouth daily.  . metFORMIN (GLUCOPHAGE) 1000 MG tablet Take 1,000 mg by mouth 2 (two) times daily.  . Multiple Vitamin (MULTIVITAMIN WITH MINERALS) TABS tablet Take 1 tablet by mouth daily.  . mupirocin ointment (BACTROBAN) 2 % APPLY TO AFFECTED AREA 3 TIMES A DAY  . Omega-3  Fatty Acids (FISH OIL) 1000 MG CAPS Take 1,000 mg by mouth daily.  . pantoprazole (PROTONIX) 40 MG tablet Take 40 mg by mouth daily.   . ranitidine (ZANTAC) 300 MG capsule Take 300 mg by mouth at bedtime.  . rosuvastatin (CRESTOR) 20 MG tablet Take 20 mg by mouth daily.  . TRESIBA FLEXTOUCH 200 UNIT/ML SOPN INJECT 30 UNITS SUB-Q NIGHTLY  . VASCEPA 1 g CAPS Take 1 capsule by mouth 2 (two) times daily.  . vitamin B-12 (CYANOCOBALAMIN) 1000 MCG tablet Take 1,000 mcg by mouth daily.  . vitamin E 400 UNIT capsule Take 400 Units by mouth daily.  [name] zolpidem (AMBIEN) 5 MG tablet Take 1 tablet (5 mg total) by mouth at bedtime.   Current Facility-Administered Medications (Other)  Medication Route  . Bevacizumab (AVASTIN) SOLN 1.25 mg Intravitreal  . Bevacizumab (AVASTIN) SOLN 1.25 mg Intravitreal  . Bevacizumab (AVASTIN) SOLN 1.25 mg Intravitreal  . Bevacizumab (AVASTIN) SOLN 1.25 mg Intravitreal  . insulin aspart (novoLOG) injection 0-15 Units Subcutaneous  . insulin aspart (novoLOG) injection 0-5 Units Subcutaneous      REVIEW OF SYSTEMS: ROS    Positive for: Endocrine, Cardiovascular, Eyes   Negative for: Constitutional, Gastrointestinal, Neurological, Skin, Genitourinary, Musculoskeletal, HENT, Respiratory, Psychiatric, Allergic/Imm, Heme/Lymph   Last edited by Lana Fish on 03/26/2018 12:36 PM. (History)       ALLERGIES No Known Allergies  PAST MEDICAL HISTORY Past Medical History:  Diagnosis Date  . Anxiety   . Arthritis   . Cataracts, both eyes   . GERD (gastroesophageal reflux disease)   . Gout   . History of fracture of patella    right knee  . History of positive PPD    Patient always shows positive  . Hyperlipidemia   . Hypertension   . Hypothyroidism   . Lichen sclerosus 12/30/2013   of vulva  . Metatarsal fracture   . Neuropathy   . Peripheral vascular disease (HCC)   . Polyneuropathy    numbness and tingling in feet and toes  . Renal insufficiency   .  Type 2 diabetes mellitus, uncontrolled (HCC)    Past Surgical History:  Procedure Laterality Date  . APPENDECTOMY    . BREAST REDUCTION SURGERY  2001  . CATARACT EXTRACTION    . CESAREAN SECTION  1976  . COLONOSCOPY  03/05/2013   Nml - due for repeat 03/06/2018  . DILATION AND CURETTAGE OF UTERUS  1989  . ENDARTERECTOMY FEMORAL Bilateral 03/09/2018   Procedure: ENDARTERECTOMY FEMORAL;  Surgeon: Annice Needy, MD;  Location: [facility] ORS;  Service: Vascular;  Laterality: Bilateral;  . ENDARTERECTOMY POPLITEAL Left 03/09/2018   Procedure: ENDARTERECTOMY POPLITEAL  AND SFA;  Surgeon: Annice Needy, MD;  Location: [facility] ORS;  Service: Vascular;  Laterality: Left;  . ESOPHAGOGASTRODUODENOSCOPY  03/05/2013  . EYE SURGERY    . Eyelid Surgery  2012  . INTRAMEDULLARY (IM) NAIL INTERTROCHANTERIC Left 10/30/2015   Procedure: INTRAMEDULLARY (IM) NAIL INTERTROCHANTRIC ;  Surgeon: Kennedy Bucker, MD;  Location: [facility] ORS;  Service: Orthopedics;  Laterality: Left;  . LAPAROSCOPIC HYSTERECTOMY  2000   total  . LOWER EXTREMITY ANGIOGRAPHY Left 03/08/2017   Procedure: LOWER EXTREMITY ANGIOGRAPHY;  Surgeon: Annice Needy, MD;  Location: [facility] IN[facility]ASIVE [facility];  Service: Cardiovascular;  Laterality: Left;  . LOWER EXTREMITY ANGIOGRAPHY Left 10/30/2017   Procedure: LOWER EXTREMITY ANGIOGRAPHY;  Surgeon: Annice Needy, MD;  Location: [facility] IN[facility];  Service: Cardiovascular;  Laterality: Left;  . LOWER EXTREMITY ANGIOGRAPHY Right 03/08/2018   Procedure: LOWER EXTREMITY ANGIOGRAPHY;  Surgeon: Annice Needy, MD;  Location: [facility];  Service: Cardiovascular;  Laterality: Right;  . PERIPHERAL VASCULAR INTERVENTION  03/08/2018   Procedure: PERIPHERAL VASCULAR INTERVENTION;  Surgeon: Annice Needy, MD;  Location: [facility] IN[facility];  Service: Cardiovascular;;  . REDUCTION MAMMAPLASTY  1997    FAMILY HISTORY Family History  Problem Relation Age of Onset  . Coronary artery disease Father   . Heart attack Father   .  Coronary artery disease Mother   . Heart attack Mother   . Ovarian cancer Sister 58       sister had hormonal therapy for IVF txs-which increased risk factor for ovarian cancer  . Breast cancer Neg Hx     SOCIAL HISTORY Social History   Tobacco Use  . Smoking status: Former Smoker    Packs/day: 1.00    Years: 20.00    Pack years: 20.00    Types: Cigarettes    Last attempt to quit: 03/07/1996    Years since quitting: 22.0  . Smokeless tobacco: Never Used  . Tobacco comment: started smoking at age 75  Substance Use Topics  . Alcohol use: No    Alcohol/week: 0.0 standard drinks  . Drug use: No         OPHTHALMIC EXAM:  Base Eye Exam    Visual Acuity (Snellen - Linear)      Right Left   Dist [place] 20/70-3 20/25   Dist ph [place] 20/70 20/20       Tonometry (Tonopen, 12:46 PM)      Right Left   Pressure 12 11       Pupils      Dark Light Shape React APD   Right 2 1 Round Brisk None   Left 2 1 Round Brisk None       Visual Fields      Left Right    Full Full       Extraocular Movement      Right Left    Full, Ortho Full, Ortho       Neuro/Psych    Oriented x3:  Yes   Mood/Affect:  Normal       Dilation    Both eyes:  2.5% Phenylephrine, 1.0% Mydriacyl @ 12:46 PM        Slit Lamp and Fundus Exam    External Exam      Right Left   External Normal Normal       Slit Lamp Exam      Right Left   Lids/Lashes dermatochalasis dermatochalasis   Conjunctiva/Sclera White and quiet White and quiet  Cornea arcus; well healed cataract wound; 1+ Punctate epithelial erosions, decreased TBUT, mild Anterior basement membrane dystrophy superiorly arcus; well healed cataract wound, 1+ Punctate epithelial erosions, irregualr epi surface, decreased TBUT   Anterior Chamber Deep and quiet Deep and quiet   Iris Round and dilated Round and dilated   Lens PCIOL; open PC PCIOL; open PC   Vitreous syneresis, Posterior vitreous detachment, floaters inferiorly, trace-1+ cell  syneresis, Posterior vitreous detachment       Fundus Exam      Right Left   Disc +IRH superior disc Pink and Sharp   C/D Ratio 0.4 0.5   Macula Blunted foveal reflex, +Cystic changes, Epiretinal membrane flat; no heme or edema   Vessels Vascular attenuation, Tortuous Vascular attenuation   Periphery attached; 360 Mas/DBH peripherally attached        Refraction    Manifest Refraction (Auto)      Sphere Cylinder Axis Dist VA   Right +0.25 +0.75 164 20/70   Left -0.50 +1.00 `150 20/20          IMAGING AND PROCEDURES  Imaging and Procedures for 04/25/17  OCT, Retina - OU - Both Eyes       Right Eye Quality was good. Central Foveal Thickness: 579. Progression has worsened. Findings include abnormal foveal contour, epiretinal membrane, no SRF, intraretinal fluid (Interval increase in IRF ).   Left Eye Quality was good. Central Foveal Thickness: 286. Progression has been stable. Findings include normal foveal contour, no IRF, no SRF.   Notes *Images captured and stored on drive  Diagnosis / Impression:  OD: +IRF/CME centrally; no SRF -- interval increase in IRF OS: NFP; no IRF/SRF  Clinical management:  See below  Abbreviations: NFP - Normal foveal profile. CME - cystoid macular edema. PED - pigment epithelial detachment. IRF - intraretinal fluid. SRF - subretinal fluid. EZ - ellipsoid zone. ERM - epiretinal membrane. ORA - outer retinal atrophy. ORT - outer retinal tubulation. SRHM - subretinal hyper-reflective material        Intravitreal Injection, Pharmacologic Agent - OD - Right Eye       Time Out 03/26/2018. 1:16 PM. Confirmed correct patient, procedure, site, and patient consented.   Anesthesia Topical anesthesia was used. Anesthetic medications included Lidocaine 2%, Proparacaine 0.5%.   Procedure Preparation included 5% betadine to ocular surface, eyelid speculum. A 30 gauge needle was used.   Injection:  2 mg aflibercept Gretta Cool) SOLN   NDC:  L6038910, Lot: [id number], Expiration date: 09/02/2018   Route: Intravitreal, Site: Right Eye, Waste: 0.05 mL  Post-op Post injection exam found visual acuity of at least counting fingers. The patient tolerated the procedure well. There were no complications. The patient received written and verbal post procedure care education.                 ASSESSMENT/PLAN:    ICD-10-CM   1. Branch retinal vein occlusion of right eye with macular edema H34.8310 Intravitreal Injection, Pharmacologic Agent - OD - Right Eye    aflibercept (EYLEA) SOLN 2 mg  2. Retinal edema H35.81 OCT, Retina - OU - Both Eyes  3. 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 Essential hypertension I10   5. Hypertensive retinopathy of both eyes H35.033   6. Epiretinal membrane (ERM) of right eye H35.371   7. Pseudophakia of both eyes Z96.1   8. 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 CME OD -- likely remote BRVO OD  - by history, pt states symptoms first noticed 2 wks prior to presentation, but reports changes may have occurred prior  - initial exam with differential tortuosity of vessels (OD > OS)  - FA (02.10.20) shows mild late staining / leakage in macula, staining / leakage of disc -- improving CME  - differential includes DM2 (DME), hypertensive retinopathy, inflammatory etiology / uveitis  - S/P IVA OD #1 (02.08.19), #2 (03.11.19), #3 (04.09.19), #4 (05.20.19), #5 (02.10.20)  - review of OCTs show interval increase in CME OD after initial improvement post IVA #1 --  resistance to IVA   - June 2019 -- switched therapies: S/P IVE OD #1 (06.24.19), #2 (07.24.19), #3 (09.04.19), #4 (10.30.19),#5 (12.30.19) - gave IVA on 2.10.20 due to pending Eylea4U for 2020 - OCT today shows interval increase in IRF - BCVA decreased to 20/70-3 from 20/40 OD today - Eylea4U benefits investigation for 2020 now  completed and pt approved for IVE - recommend IVE OD #6 (03.23.20) - RBA of procedure discussed, questions answered - informed consent obtained and signed - see procedure note - F/U 6 weeks -- DFE/OCT  3. Mild nonproliferative diabetic retinopathy, both eyes - The incidence, risk factors for progression, natural history and treatment options for diabetic retinopathy were discussed with patient.   - The need for close monitoring of blood glucose, blood pressure, and serum lipids, avoiding cigarette or any type of tobacco, and the need for long term follow up was also discussed with patient. - could be contributing to CME OD - OS with minimal diabetic retinopathy - continue to monitor  4,5. Hypertensive retinopathy OU - stable - as above, may have contributing to CME OD - discussed importance of tight BP control - monitor  6. Epiretinal membrane, right eye  The natural history, anatomy, potential for loss of vision, and treatment options including vitrectomy techniques and the complications of endophthalmitis, retinal detachment, vitreous hemorrhage, cataract progression and permanent vision loss discussed with the patient. - stable - no indication for surgery at this time  7. Pseudophakia OU - s/p CE/IOL OU by cataract surgeon in [facility] - doing well - monitor  8. Optic disc edema OD -- sectoral - likely secondary to BRVO but differential includes carotid stenosis and retro-orbital mass - history of blood clots - recommend CT orbits w/ contrast to r/o retro-orbital mass -- not obtained - recommend carotid dopplers to r/o stenosis / occlusion -- pt scheduled for repeat u/s due to recent bilateral femoral endarterectomies -- strong history of peripheral vascular disease   Ophthalmic Meds Ordered this visit:  Meds ordered this encounter  Medications  . aflibercept (EYLEA) SOLN 2 mg       Return in about 6 weeks (around 05/07/2018) for f/u CME OD, DFE, OCT.  There are no Patient  Instructions on file for this visit.   Explained the diagnoses, plan, and follow up with the patient and they expressed understanding.  Patient expressed understanding of the importance of proper follow up care.   This document serves as a record of services personally performed by Karie Chimera, MD, PhD. It was created on their behalf by Laurian Brim, OA, an ophthalmic assistant. The creation of this record is the provider's dictation and/or activities during the visit.    Electronically signed by: Laurian Brim, OA  03.22.2020 1:27 PM    Karie Chimera, M.D., Ph.D. Diseases & Surgery of the Retina and Vitreous [facility]  I have reviewed the above documentation for accuracy and completeness, and I agree with the above. Karie Chimera, M.D., Ph.D. 03/26/18 1:27 PM    Abbreviations: M myopia (nearsighted); A astigmatism; H hyperopia (farsighted); P presbyopia; Mrx spectacle prescription;  CTL contact lenses; OD right eye; OS left eye; OU both eyes  XT exotropia; ET esotropia; PEK punctate epithelial keratitis; PEE punctate epithelial erosions; DES dry eye syndrome; MGD meibomian gland dysfunction; ATs artificial tears; PFAT's preservative free artificial tears; NSC nuclear sclerotic cataract; PSC posterior subcapsular cataract; ERM epi-retinal membrane; PVD posterior vitreous detachment; RD retinal detachment; DM diabetes mellitus; DR diabetic retinopathy; NPDR non-proliferative diabetic retinopathy; PDR proliferative diabetic retinopathy; CSME clinically significant macular edema; DME diabetic macular edema; dbh dot blot hemorrhages; CWS cotton wool spot; POAG primary open angle glaucoma; C/D cup-to-disc ratio; HVF humphrey visual field; GVF goldmann visual field; OCT optical coherence tomography; IOP intraocular pressure; BRVO Branch retinal vein occlusion; CRVO central retinal vein occlusion; CRAO central retinal artery occlusion; BRAO branch retinal artery occlusion;  RT retinal tear; SB scleral buckle; PPV pars plana vitrectomy; VH Vitreous hemorrhage; PRP panretinal laser photocoagulation; IVK intravitreal kenalog; VMT vitreomacular traction; MH Macular hole;  NVD neovascularization of the disc; NVE neovascularization elsewhere; AREDS age related eye disease study; ARMD age related macular degeneration; POAG primary open angle glaucoma; EBMD epithelial/anterior basement membrane dystrophy; ACIOL anterior chamber intraocular lens; IOL intraocular lens; PCIOL posterior chamber intraocular lens; Phaco/IOL phacoemulsification with intraocular lens placement; PRK photorefractive keratectomy; LASIK laser assisted in situ keratomileusis; HTN hypertension; DM diabetes mellitus; COPD chronic obstructive pulmonary disease

## 2018-03-26 ENCOUNTER — Ambulatory Visit (INDEPENDENT_AMBULATORY_CARE_PROVIDER_SITE_OTHER): Payer: Medicare Other | Admitting: Ophthalmology

## 2018-03-26 ENCOUNTER — Other Ambulatory Visit: Payer: Self-pay

## 2018-03-26 ENCOUNTER — Encounter (INDEPENDENT_AMBULATORY_CARE_PROVIDER_SITE_OTHER): Payer: Self-pay | Admitting: Ophthalmology

## 2018-03-26 ENCOUNTER — Other Ambulatory Visit (INDEPENDENT_AMBULATORY_CARE_PROVIDER_SITE_OTHER): Payer: Self-pay | Admitting: Vascular Surgery

## 2018-03-26 DIAGNOSIS — H35033 Hypertensive retinopathy, bilateral: Secondary | ICD-10-CM

## 2018-03-26 DIAGNOSIS — H3581 Retinal edema: Secondary | ICD-10-CM | POA: Diagnosis not present

## 2018-03-26 DIAGNOSIS — H35371 Puckering of macula, right eye: Secondary | ICD-10-CM

## 2018-03-26 DIAGNOSIS — H34831 Tributary (branch) retinal vein occlusion, right eye, with macular edema: Secondary | ICD-10-CM | POA: Diagnosis not present

## 2018-03-26 DIAGNOSIS — E113213 Type 2 diabetes mellitus with mild nonproliferative diabetic retinopathy with macular edema, bilateral: Secondary | ICD-10-CM

## 2018-03-26 DIAGNOSIS — I10 Essential (primary) hypertension: Secondary | ICD-10-CM | POA: Diagnosis not present

## 2018-03-26 DIAGNOSIS — H4710 Unspecified papilledema: Secondary | ICD-10-CM

## 2018-03-26 DIAGNOSIS — Z961 Presence of intraocular lens: Secondary | ICD-10-CM

## 2018-03-26 DIAGNOSIS — Z9862 Peripheral vascular angioplasty status: Secondary | ICD-10-CM

## 2018-03-26 MED ORDER — AFLIBERCEPT 2MG/0.05ML IZ SOLN FOR KALEIDOSCOPE
2.0000 mg | INTRAVITREAL | Status: AC | PRN
  Administered 2018-03-26: 2 mg via INTRAVITREAL

## 2018-03-26 NOTE — Telephone Encounter (Signed)
That is fine 

## 2018-03-26 NOTE — Telephone Encounter (Signed)
Left message with order below. AS, CMA

## 2018-03-28 ENCOUNTER — Ambulatory Visit (INDEPENDENT_AMBULATORY_CARE_PROVIDER_SITE_OTHER): Payer: Medicare Other

## 2018-03-28 ENCOUNTER — Ambulatory Visit (INDEPENDENT_AMBULATORY_CARE_PROVIDER_SITE_OTHER): Payer: Medicare Other | Admitting: Nurse Practitioner

## 2018-03-28 ENCOUNTER — Other Ambulatory Visit: Payer: Self-pay

## 2018-03-28 ENCOUNTER — Encounter (INDEPENDENT_AMBULATORY_CARE_PROVIDER_SITE_OTHER): Payer: Self-pay | Admitting: Nurse Practitioner

## 2018-03-28 VITALS — BP 102/62 | HR 87 | Resp 10 | Ht 65.0 in | Wt 161.0 lb

## 2018-03-28 DIAGNOSIS — Z9862 Peripheral vascular angioplasty status: Secondary | ICD-10-CM

## 2018-03-28 DIAGNOSIS — I10 Essential (primary) hypertension: Secondary | ICD-10-CM

## 2018-03-28 DIAGNOSIS — I6523 Occlusion and stenosis of bilateral carotid arteries: Secondary | ICD-10-CM

## 2018-03-28 DIAGNOSIS — T8149XA Infection following a procedure, other surgical site, initial encounter: Secondary | ICD-10-CM

## 2018-03-28 DIAGNOSIS — Z87891 Personal history of nicotine dependence: Secondary | ICD-10-CM

## 2018-03-28 DIAGNOSIS — Z79899 Other long term (current) drug therapy: Secondary | ICD-10-CM

## 2018-03-28 DIAGNOSIS — I70219 Atherosclerosis of native arteries of extremities with intermittent claudication, unspecified extremity: Secondary | ICD-10-CM

## 2018-03-28 MED ORDER — COLLAGENASE 250 UNIT/GM EX OINT: 1.0000 "application " | TOPICAL_OINTMENT | Freq: Every day | CUTANEOUS | 1 refills | Status: DC

## 2018-03-29 ENCOUNTER — Encounter (INDEPENDENT_AMBULATORY_CARE_PROVIDER_SITE_OTHER): Payer: Self-pay

## 2018-03-29 ENCOUNTER — Telehealth (INDEPENDENT_AMBULATORY_CARE_PROVIDER_SITE_OTHER): Payer: Self-pay

## 2018-03-29 NOTE — Telephone Encounter (Signed)
Cindy from [facility] called wanting to clarify the orders for the patient's wound care. Per Sheppard Plumber NP, a wet to dry dressing with Santyl and Vaseline gauze on bilateral groins. This recommendation was given to [facility] and she understood.

## 2018-04-05 ENCOUNTER — Encounter (INDEPENDENT_AMBULATORY_CARE_PROVIDER_SITE_OTHER): Payer: Self-pay

## 2018-04-06 NOTE — Progress Notes (Signed)
SUBJECTIVE:  Patient ID: Cheryl Leonard, female    DOB: 1945-06-26, 73 y.o.   MRN: [id number] Chief Complaint  Patient presents with  . Follow-up    HPI  Cheryl Leonard is a 73 y.o. female that underwent bilateral femoral endarterectomy on 03/09/2018.  Subsequently she had some issues with wound healing.  On first visit her wounds appear to be oozing and infected.  On this visit the oozing has stopped however there are copious amounts of fibrinous exudate on her left lower extremity wound whereas the right lower extremity wound has shown some signs of deterioration since that time.  She denies any fever, chills, nausea, vomiting or diarrhea.  We have obtained home health and they have been doing wet-to-dry wound dressings at this time.  She denies any chest pain or shortness of breath.  She denies any TIA-like symptoms or amaurosis fugax.  She has a wound on her left lower extremity which is been slow to heal since this time.  It appears to have just the top outer fat layer that is missing.  ABIs are 1.23 on the right and 0.56 on the left.  She does endorse some claudication of the left lower extremity.  The right lower extremity has triphasic tibial artery waveforms.  The left lower extremity has monophasic anterior tibial artery waveform with a biphasic posterior tibial artery waveform.  Past Medical History:  Diagnosis Date  . Anxiety   . Arthritis   . Cataracts, both eyes   . GERD (gastroesophageal reflux disease)   . Gout   . History of fracture of patella    right knee  . History of positive PPD    Patient always shows positive  . Hyperlipidemia   . Hypertension   . Hypothyroidism   . Lichen sclerosus 12/30/2013   of vulva  . Metatarsal fracture   . Neuropathy   . Peripheral vascular disease (HCC)   . Polyneuropathy    numbness and tingling in feet and toes  . Renal insufficiency   . Type 2 diabetes mellitus, uncontrolled (HCC)     Past Surgical History:   Procedure Laterality Date  . APPENDECTOMY    . BREAST REDUCTION SURGERY  2001  . CATARACT EXTRACTION    . CESAREAN SECTION  1976  . COLONOSCOPY  03/05/2013   Nml - due for repeat 03/06/2018  . DILATION AND CURETTAGE OF UTERUS  1989  . ENDARTERECTOMY FEMORAL Bilateral 03/09/2018   Procedure: ENDARTERECTOMY FEMORAL;  Surgeon: Annice Needy, MD;  Location: [facility];  Service: Vascular;  Laterality: Bilateral;  . ENDARTERECTOMY POPLITEAL Left 03/09/2018   Procedure: ENDARTERECTOMY POPLITEAL AND SFA;  Surgeon: Annice Needy, MD;  Location: [facility];  Service: Vascular;  Laterality: Left;  . ESOPHAGOGASTRODUODENOSCOPY  03/05/2013  . EYE SURGERY    . Eyelid Surgery  2012  . INTRAMEDULLARY (IM) NAIL INTERTROCHANTERIC Left 10/30/2015   Procedure: INTRAMEDULLARY (IM) NAIL INTERTROCHANTRIC ;  Surgeon: Kennedy Bucker, MD;  Location: [facility];  Service: Orthopedics;  Laterality: Left;  . LAPAROSCOPIC HYSTERECTOMY  2000   total  . LOWER EXTREMITY ANGIOGRAPHY Left 03/08/2017   Procedure: LOWER EXTREMITY ANGIOGRAPHY;  Surgeon: Annice Needy, MD;  Location: [facility];  Service: Cardiovascular;  Laterality: Left;  . LOWER EXTREMITY ANGIOGRAPHY Left 10/30/2017   Procedure: LOWER EXTREMITY ANGIOGRAPHY;  Surgeon: Annice Needy, MD;  Location: [facility];  Service: Cardiovascular;  Laterality: Left;  . LOWER EXTREMITY ANGIOGRAPHY Right 03/08/2018   Procedure:  LOWER EXTREMITY ANGIOGRAPHY;  Surgeon: Annice Needy, MD;  Location: [facility];  Service: Cardiovascular;  Laterality: Right;  . PERIPHERAL VASCULAR INTERVENTION  03/08/2018   Procedure: PERIPHERAL VASCULAR INTERVENTION;  Surgeon: Annice Needy, MD;  Location: [facility];  Service: Cardiovascular;;  . REDUCTION MAMMAPLASTY  1997    Social History   Socioeconomic History  . Marital status: Married    Spouse name: Not on file  . Number of children: Not on file  . Years of education: Not on file  . Highest education level: Not on  file  Occupational History  . Not on file  Social Needs  . Financial resource strain: Not on file  . Food insecurity:    Worry: Not on file    Inability: Not on file  . Transportation needs:    Medical: Not on file    Non-medical: Not on file  Tobacco Use  . Smoking status: Former Smoker    Packs/day: 1.00    Years: 20.00    Pack years: 20.00    Types: Cigarettes    Last attempt to quit: 03/07/1996    Years since quitting: 22.0  . Smokeless tobacco: Never Used  . Tobacco comment: started smoking at age 57  Substance and Sexual Activity  . Alcohol use: No    Alcohol/week: 0.0 standard drinks  . Drug use: No  . Sexual activity: Yes    Partners: Male    Birth control/protection: Surgical  Lifestyle  . Physical activity:    Days per week: Not on file    Minutes per session: Not on file  . Stress: Not on file  Relationships  . Social connections:    Talks on phone: Not on file    Gets together: Not on file    Attends religious service: Not on file    Active member of club or organization: Not on file    Attends meetings of clubs or organizations: Not on file    Relationship status: Not on file  . Intimate partner violence:    Fear of current or ex partner: Not on file    Emotionally abused: Not on file    Physically abused: Not on file    Forced sexual activity: Not on file  Other Topics Concern  . Not on file  Social History Narrative  . Not on file    Family History  Problem Relation Age of Onset  . Coronary artery disease Father   . Heart attack Father   . Coronary artery disease Mother   . Heart attack Mother   . Ovarian cancer Sister 64       sister had hormonal therapy for IVF txs-which increased risk factor for ovarian cancer  . Breast cancer Neg Hx     No Known Allergies   Review of Systems   Review of Systems: Negative Unless Checked Constitutional: Weight loss  Fever  Chills Cardiac: Chest pain    Atrial Fibrillation  Palpitations    Shortness of breath when laying flat   Shortness of breath with exertion. Shortness of breath at rest Vascular:  Pain in legs with walking   Pain in legs with standing Pain in legs when laying flat   Claudication    Pain in feet when laying flat    History of DVT   Phlebitis   Swelling in legs   Varicose veins   Non-healing ulcers Pulmonary:   Uses home oxygen   Productive cough     Hemoptysis   [] Wheeze  [] COPD   [] Asthma Neurologic:  [] Dizziness   [] Seizures  [] Blackouts [] History of stroke   [] History of TIA  [] Aphasia   [] Temporary Blindness   [] Weakness or numbness in arm   [] Weakness or numbness in leg Musculoskeletal:   [] Joint swelling   [] Joint pain   [] Low back pain  []  History of Knee Replacement [] Arthritis [] back Surgeries  []  Spinal Stenosis    Hematologic:  [] Easy bruising  [] Easy bleeding   [] Hypercoagulable state   [] Anemic Gastrointestinal:  [] Diarrhea   [] Vomiting  [] Gastroesophageal reflux/heartburn   [] Difficulty swallowing. [] Abdominal pain Genitourinary:  [x] Chronic kidney disease   [] Difficult urination  [] Anuric   [] Blood in urine [] Frequent urination  [] Burning with urination   [] Hematuria Skin:  [] Rashes   [] Ulcers [x] Wounds Psychological:  [] History of anxiety   []  History of major depression  []  Memory Difficulties      OBJECTIVE:   Physical Exam  BP 102/62 (BP Location: Left Arm, Patient Position: Sitting, Cuff Size: Small)   Pulse 87   Resp 10   Ht 5\' 5"  (1.651 m)   Wt 161 lb (73 kg)   BMI 26.79 kg/m   Gen: WD/WN, NAD Head: [place]/AT, No temporalis wasting.  Ear/Nose/Throat: Hearing grossly intact, nares w/o erythema or drainage Eyes: PER, EOMI, sclera nonicteric.  Neck: Supple, no masses.  No JVD.  Pulmonary:  Good air movement, no use of accessory muscles.  Cardiac: RRR Vascular:  Right groin has 2 areas with fibrinous exudate.  The entire left groin has fibrinous exudate throughout the wound.  Left toe has a very small  ulceration present. Vessel Right Left  Radial Palpable Palpable   Gastrointestinal: soft, non-distended. No guarding/no peritoneal signs.  Musculoskeletal: M/S 5/5 throughout.  No deformity or atrophy.  Neurologic: Pain and light touch intact in extremities.  Symmetrical.  Speech is fluent. Motor exam as listed above. Psychiatric: Judgment intact, Mood & affect appropriate for pt's clinical situation. Dermatologic: No Venous rashes. No Ulcers Noted.  No changes consistent with cellulitis. Lymph : No Cervical lymphadenopathy, no lichenification or skin changes of chronic lymphedema.       ASSESSMENT AND PLAN:  1. Postoperative wound infection We will continue with home health doing dressing 3 times a week.  Instead of doing plain wet-to-dry we will attempt to utilize Santyl ointment on the wound to see if this is able to debride the wound effectively.  If so we will transition Aquacel Ag to also see if this can provide him with better wound healing.  If the wound has deteriorated we may consider surgical debridement with wound VAC placement.  2. 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  As long as there are no limb threatening symptoms, we will delay revascularization until her groin sites have healed.  3. Essential hypertension Continue antihypertensive medications as already ordered, these medications have been reviewed and there are no changes at this time.   4. Carotid atherosclerosis, bilateral She has 1 to 39% stenosis bilaterally.  We will recheck these in 1 year.  Current Outpatient Medications on File Prior to Visit  Medication Sig Dispense Refill  . aspirin EC 81 MG tablet Take 81 mg by mouth daily.     [name] atorvastatin  (LIPITOR)  20 MG tablet Take 20 mg by mouth at bedtime.  3  . buPROPion (WELLBUTRIN XL) 150 MG 24 hr tablet Take 150 mg by mouth daily. Take with 300 mg to equal 450 mg once daily    . buPROPion (WELLBUTRIN XL) 300 MG 24 hr tablet Take 1 tablet (300 mg total) by mouth daily.    [name] BYSTOLIC 5 MG tablet Take 5 mg by mouth daily.  2  . cephALEXin (KEFLEX) 500 MG capsule Take 1 capsule (500 mg total) by mouth 3 (three) times daily. 21 capsule 0  . chlorhexidine (PERIDEX) 0.12 % solution RINSE FOR 30 SECONDS WITH 0.5 OZ TWICE A DAY  0  . cholecalciferol (VITAMIN D) 1000 units tablet Take 1,000 Units by mouth 2 (two) times daily.    [name] CINNAMON PO Take 1 tablet by mouth daily.    . clopidogrel (PLAVIX) 75 MG tablet Take 1 tablet (75 mg total) by mouth daily. 30 tablet 11  . CORAL CALCIUM PO Take 1 tablet by mouth daily.    [name] denosumab (PROLIA) 60 MG/ML SOLN injection Inject into the skin.    . DENTA 5000 PLUS 1.1 % CREA dental cream 2 (two) times daily. as directed  99  . DULoxetine (CYMBALTA) 60 MG capsule Take 1 capsule (60 mg total) by mouth 2 (two) times daily.  3  . ferrous sulfate 325 (65 FE) MG tablet Take 1 tablet (325 mg total) by mouth daily with breakfast. 30 tablet 3  . gemfibrozil (LOPID) 600 MG tablet Take 600 mg by mouth 2 (two) times daily.  3  . HYDROcodone-acetaminophen (NORCO/VICODIN) 5-325 MG tablet Take 1-2 tablets by mouth every 6 (six) hours as needed for moderate pain or severe pain. 56 tablet 0  . Insulin Pen Needle (B-D UF III MINI PEN NEEDLES) 31G X 5 MM MISC once daily.    . Iron-Vitamin C (VITRON-C) 65-125 MG TABS Take 1 tablet by mouth daily.     [name] levothyroxine (SYNTHROID, LEVOTHROID) 100 MCG tablet Take 100 mcg by mouth daily.  3  . lisinopril (PRINIVIL,ZESTRIL) 10 MG tablet Take 10 mg by mouth daily.     . Magnesium 500 MG TABS Take 500 mg by mouth daily.    . metFORMIN (GLUCOPHAGE) 1000 MG tablet Take 1,000 mg by mouth 2 (two) times daily.    . Multiple Vitamin  (MULTIVITAMIN WITH MINERALS) TABS tablet Take 1 tablet by mouth daily.    . mupirocin ointment (BACTROBAN) 2 % APPLY TO AFFECTED AREA 3 TIMES A DAY  0  . Omega-3 Fatty Acids (FISH OIL) 1000 MG CAPS Take 1,000 mg by mouth daily.    . pantoprazole (PROTONIX) 40 MG tablet Take 40 mg by mouth daily.     . ranitidine (ZANTAC) 300 MG capsule Take 300 mg by mouth at bedtime.  1  . rosuvastatin (CRESTOR) 20 MG tablet Take 20 mg by mouth daily.    . TRESIBA FLEXTOUCH 200 UNIT/ML SOPN INJECT 30 UNITS SUB-Q NIGHTLY  5  . VASCEPA 1 g CAPS Take 1 capsule by mouth 2 (two) times daily.    . vitamin B-12 (CYANOCOBALAMIN) 1000 MCG tablet Take 1,000 mcg by mouth daily.    . vitamin E 400 UNIT capsule Take 400 Units by mouth daily.    [name] zolpidem (AMBIEN) 5 MG tablet Take 1 tablet (5 mg total) by mouth at bedtime. 30 tablet 0   Current Facility-Administered Medications on File Prior to Visit  Medication Dose Route Frequency Provider Last  Rate Last Dose  . aflibercept (EYLEA) SOLN 2 mg  2 mg Intravitreal  [name], MD   2 mg at 06/27/17 1307  . aflibercept (EYLEA) SOLN 2 mg  2 mg Intravitreal  [name], MD   2 mg at 07/26/17 1702  . aflibercept (EYLEA) SOLN 2 mg  2 mg Intravitreal  [name], MD   2 mg at 09/06/17 1700  . aflibercept (EYLEA) SOLN 2 mg  2 mg Intravitreal  [name], MD   2 mg at 11/01/17 1550  . aflibercept (EYLEA) SOLN 2 mg  2 mg Intravitreal  [name], MD   2 mg at 01/03/18 1416  . Bevacizumab (AVASTIN) SOLN 1.25 mg  1.25 mg Intravitreal  [name], MD   1.25 mg at 02/10/17 0953  . Bevacizumab (AVASTIN) SOLN 1.25 mg  1.25 mg Intravitreal  [name], MD   1.25 mg at 03/13/17 1717  . Bevacizumab (AVASTIN) SOLN 1.25 mg  1.25 mg Intravitreal  [name], MD   1.25 mg at 04/11/17 1626  . Bevacizumab (AVASTIN) SOLN 1.25 mg  1.25 mg Intravitreal  [name], MD   1.25 mg at 02/12/18 2154  . insulin aspart (novoLOG) injection 0-15 Units  0-15 Units Subcutaneous TID WC  [name], [name], PA-C      . insulin aspart (novoLOG) injection 0-5 Units  0-5 Units Subcutaneous QHS [name], [name], PA-C        There are no Patient Instructions on file for this visit. No follow-ups on file.   Georgiana Spinner, NP  This note was completed with Office manager.  Any errors are purely unintentional.

## 2018-04-08 ENCOUNTER — Encounter (INDEPENDENT_AMBULATORY_CARE_PROVIDER_SITE_OTHER): Payer: Self-pay | Admitting: Nurse Practitioner

## 2018-04-09 ENCOUNTER — Other Ambulatory Visit: Payer: Self-pay

## 2018-04-09 ENCOUNTER — Ambulatory Visit (INDEPENDENT_AMBULATORY_CARE_PROVIDER_SITE_OTHER): Payer: Medicare Other | Admitting: Nurse Practitioner

## 2018-04-09 ENCOUNTER — Encounter (INDEPENDENT_AMBULATORY_CARE_PROVIDER_SITE_OTHER): Payer: Self-pay | Admitting: Nurse Practitioner

## 2018-04-09 VITALS — BP 144/82 | HR 90 | Resp 16 | Wt 162.6 lb

## 2018-04-09 DIAGNOSIS — E785 Hyperlipidemia, unspecified: Secondary | ICD-10-CM

## 2018-04-09 DIAGNOSIS — E1165 Type 2 diabetes mellitus with hyperglycemia: Secondary | ICD-10-CM

## 2018-04-09 DIAGNOSIS — T8131XD Disruption of external operation (surgical) wound, not elsewhere classified, subsequent encounter: Secondary | ICD-10-CM | POA: Insufficient documentation

## 2018-04-09 DIAGNOSIS — Z87891 Personal history of nicotine dependence: Secondary | ICD-10-CM

## 2018-04-09 DIAGNOSIS — Z79899 Other long term (current) drug therapy: Secondary | ICD-10-CM

## 2018-04-09 DIAGNOSIS — T8149XA Infection following a procedure, other surgical site, initial encounter: Secondary | ICD-10-CM

## 2018-04-09 DIAGNOSIS — I10 Essential (primary) hypertension: Secondary | ICD-10-CM

## 2018-04-09 MED ORDER — CEPHALEXIN 500 MG PO CAPS: 500.0000 mg | ORAL_CAPSULE | Freq: Three times a day (TID) | ORAL | 0 refills | Status: AC

## 2018-04-09 NOTE — Progress Notes (Signed)
SUBJECTIVE:  Patient ID: Cheryl Leonard, female    DOB: Nov 15, 1945, 73 y.o.   MRN: [id number] Chief Complaint  Patient presents with  . Follow-up    2week follow up    HPI  Cheryl Leonard is a 73 y.o. female that presents today for follow-up of wound evaluation.  Today the wound appears to have less fibrinous exudate covering and the wound bed is visible.  However, it appears that the left wound has completely dehisced and the right wound has some partial dehiscence especially near the proximal area.  There is a slight odor present.  No purulent drainage visible.  Patient denies any fever, chills, nausea, vomiting or diarrhea.  She denies any chest pain or shortness of breath.  Past Medical History:  Diagnosis Date  . Anxiety   . Arthritis   . Cataracts, both eyes   . GERD (gastroesophageal reflux disease)   . Gout   . History of fracture of patella    right knee  . History of positive PPD    Patient always shows positive  . Hyperlipidemia   . Hypertension   . Hypothyroidism   . Lichen sclerosus 12/30/2013   of vulva  . Metatarsal fracture   . Neuropathy   . Peripheral vascular disease (HCC)   . Polyneuropathy    numbness and tingling in feet and toes  . Renal insufficiency   . Type 2 diabetes mellitus, uncontrolled (HCC)     Past Surgical History:  Procedure Laterality Date  . APPENDECTOMY    . BREAST REDUCTION SURGERY  2001  . CATARACT EXTRACTION    . CESAREAN SECTION  1976  . COLONOSCOPY  03/05/2013   Nml - due for repeat 03/06/2018  . DILATION AND CURETTAGE OF UTERUS  1989  . ENDARTERECTOMY FEMORAL Bilateral 03/09/2018   Procedure: ENDARTERECTOMY FEMORAL;  Surgeon: Annice Needy, MD;  Location: [facility];  Service: Vascular;  Laterality: Bilateral;  . ENDARTERECTOMY POPLITEAL Left 03/09/2018   Procedure: ENDARTERECTOMY POPLITEAL AND SFA;  Surgeon: Annice Needy, MD;  Location: [facility];  Service: Vascular;  Laterality: Left;  . ESOPHAGOGASTRODUODENOSCOPY   03/05/2013  . EYE SURGERY    . Eyelid Surgery  2012  . INTRAMEDULLARY (IM) NAIL INTERTROCHANTERIC Left 10/30/2015   Procedure: INTRAMEDULLARY (IM) NAIL INTERTROCHANTRIC ;  Surgeon: Kennedy Bucker, MD;  Location: [facility];  Service: Orthopedics;  Laterality: Left;  . LAPAROSCOPIC HYSTERECTOMY  2000   total  . LOWER EXTREMITY ANGIOGRAPHY Left 03/08/2017   Procedure: LOWER EXTREMITY ANGIOGRAPHY;  Surgeon: Annice Needy, MD;  Location: [facility];  Service: Cardiovascular;  Laterality: Left;  . LOWER EXTREMITY ANGIOGRAPHY Left 10/30/2017   Procedure: LOWER EXTREMITY ANGIOGRAPHY;  Surgeon: Annice Needy, MD;  Location: [facility];  Service: Cardiovascular;  Laterality: Left;  . LOWER EXTREMITY ANGIOGRAPHY Right 03/08/2018   Procedure: LOWER EXTREMITY ANGIOGRAPHY;  Surgeon: Annice Needy, MD;  Location: [facility];  Service: Cardiovascular;  Laterality: Right;  . PERIPHERAL VASCULAR INTERVENTION  03/08/2018   Procedure: PERIPHERAL VASCULAR INTERVENTION;  Surgeon: Annice Needy, MD;  Location: [facility];  Service: Cardiovascular;;  . REDUCTION MAMMAPLASTY  1997    Social History   Socioeconomic History  . Marital status: Married    Spouse name: Not on file  . Number of children: Not on file  . Years of education: Not on file  . Highest education level: Not on file  Occupational History  . Not on  file  Social Needs  . Financial resource strain: Not on file  . Food insecurity:    Worry: Not on file    Inability: Not on file  . Transportation needs:    Medical: Not on file    Non-medical: Not on file  Tobacco Use  . Smoking status: Former Smoker    Packs/day: 1.00    Years: 20.00    Pack years: 20.00    Types: Cigarettes    Last attempt to quit: 03/07/1996    Years since quitting: 22.1  . Smokeless tobacco: Never Used  . Tobacco comment: started smoking at age 20  Substance and Sexual Activity  . Alcohol use: No    Alcohol/week: 0.0 standard drinks  .  Drug use: No  . Sexual activity: Yes    Partners: Male    Birth control/protection: Surgical  Lifestyle  . Physical activity:    Days per week: Not on file    Minutes per session: Not on file  . Stress: Not on file  Relationships  . Social connections:    Talks on phone: Not on file    Gets together: Not on file    Attends religious service: Not on file    Active member of club or organization: Not on file    Attends meetings of clubs or organizations: Not on file    Relationship status: Not on file  . Intimate partner violence:    Fear of current or ex partner: Not on file    Emotionally abused: Not on file    Physically abused: Not on file    Forced sexual activity: Not on file  Other Topics Concern  . Not on file  Social History Narrative  . Not on file    Family History  Problem Relation Age of Onset  . Coronary artery disease Father   . Heart attack Father   . Coronary artery disease Mother   . Heart attack Mother   . Ovarian cancer Sister 63       sister had hormonal therapy for IVF txs-which increased risk factor for ovarian cancer  . Breast cancer Neg Hx     No Known Allergies   Review of Systems   Review of Systems: Negative Unless Checked Constitutional: [] Weight loss  [] Fever  [] Chills Cardiac: [] Chest pain   []  Atrial Fibrillation  [] Palpitations   [] Shortness of breath when laying flat   [] Shortness of breath with exertion. [] Shortness of breath at rest Vascular:  [] Pain in legs with walking   [] Pain in legs with standing [] Pain in legs when laying flat   [] Claudication    [] Pain in feet when laying flat    [] History of DVT   [] Phlebitis   [] Swelling in legs   [] Varicose veins   [] Non-healing ulcers Pulmonary:   [] Uses home oxygen   [] Productive cough   [] Hemoptysis   [] Wheeze  [] COPD   [] Asthma Neurologic:  [] Dizziness   [] Seizures  [] Blackouts [] History of stroke   [] History of TIA  [] Aphasia   [] Temporary Blindness   [] Weakness or numbness in arm    [] Weakness or numbness in leg Musculoskeletal:   [] Joint swelling   [] Joint pain   [] Low back pain  []  History of Knee Replacement [x] Arthritis [] back Surgeries  []  Spinal Stenosis    Hematologic:  [] Easy bruising  [] Easy bleeding   [] Hypercoagulable state   [] Anemic Gastrointestinal:  [] Diarrhea   [] Vomiting  [x] Gastroesophageal reflux/heartburn   [] Difficulty swallowing. [] Abdominal pain Genitourinary:  [] Chronic  kidney disease   Difficult urination  Anuric   Blood in urine Frequent urination  Burning with urination   Hematuria Skin:  Rashes   Ulcers Wounds Psychological:  History of anxiety    History of major depression   Memory Difficulties      OBJECTIVE:   Physical Exam  There were no vitals taken for this visit.  Gen: WD/WN, NAD Head: [place]/AT, No temporalis wasting.  Ear/Nose/Throat: Hearing grossly intact, nares w/o erythema or drainage Eyes: PER, EOMI, sclera nonicteric.  Neck: Supple, no masses.  No JVD.  Pulmonary:  Good air movement, no use of accessory muscles.  Cardiac: RRR Vascular:  3 inch incision dehisced on left groin.  3 inch incision on right however small 1 mm dehiscence on proximal portion. Vessel Right Left  Radial Palpable Palpable   Gastrointestinal: soft, non-distended. No guarding/no peritoneal signs.  Musculoskeletal: M/S 5/5 throughout.  No deformity or atrophy.  Neurologic: Pain and light touch intact in extremities.  Symmetrical.  Speech is fluent. Motor exam as listed above. Psychiatric: Judgment intact, Mood & affect appropriate for pt's clinical situation. Dermatologic: No Venous rashes. No Ulcers Noted.  No changes consistent with cellulitis. Lymph : No Cervical lymphadenopathy, no lichenification or skin changes of chronic lymphedema.       ASSESSMENT AND PLAN:  1. Wound disruption, post-op, skin, subsequent encounter Santyl has done an excellent job cleaning the wound, at this time we will transition to Aquacel Ag  for wound.  I have contacted the home health agency with follow-up instructions to change this dressing 3 times a week.  They are also instructed to contact us if there are any changes within the wound status.  We will have him follow-up within 4 weeks  2. Essential hypertension Continue antihypertensive medications as already ordered, these medications have been reviewed and there are no changes at this time.   3. Uncontrolled type 2 diabetes mellitus with hyperglycemia (HCC) Continue hypoglycemic medications as already ordered, these medications have been reviewed and there are no changes at this time.  Hgb A1C to be monitored as already arranged by primary service   4. Hyperlipidemia, unspecified hyperlipidemia type Continue statin as ordered and reviewed, no changes at this time    Current Outpatient Medications on File Prior to Visit  Medication Sig Dispense Refill  . aspirin EC 81 MG tablet Take 81 mg by mouth daily.     [name] atorvastatin (LIPITOR) 20 MG tablet Take 20 mg by mouth at bedtime.  3  . buPROPion (WELLBUTRIN XL) 150 MG 24 hr tablet Take 150 mg by mouth daily. Take with 300 mg to equal 450 mg once daily    . buPROPion (WELLBUTRIN XL) 300 MG 24 hr tablet Take 1 tablet (300 mg total) by mouth daily.    [name] BYSTOLIC 5 MG tablet Take 5 mg by mouth daily.  2  . cephALEXin (KEFLEX) 500 MG capsule Take 1 capsule (500 mg total) by mouth 3 (three) times daily. 21 capsule 0  . chlorhexidine (PERIDEX) 0.12 % solution RINSE FOR 30 SECONDS WITH 0.5 OZ TWICE A DAY  0  . cholecalciferol (VITAMIN D) 1000 units tablet Take 1,000 Units by mouth 2 (two) times daily.    [name] CINNAMON PO Take 1 tablet by mouth daily.    . clopidogrel (PLAVIX) 75 MG tablet Take 1 tablet (75 mg total) by mouth daily. 30 tablet 11  . collagenase (SANTYL) ointment Apply 1 application topically daily. 90 g 1  . CORAL  CALCIUM PO Take 1 tablet by mouth daily.    [name]. denosumab (PROLIA) 60 MG/ML SOLN injection Inject into  the skin.    . DENTA 5000 PLUS 1.1 % CREA dental cream 2 (two) times daily. as directed  99  . DULoxetine (CYMBALTA) 60 MG capsule Take 1 capsule (60 mg total) by mouth 2 (two) times daily.  3  . ferrous sulfate 325 (65 FE) MG tablet Take 1 tablet (325 mg total) by mouth daily with breakfast. 30 tablet 3  . gemfibrozil (LOPID) 600 MG tablet Take 600 mg by mouth 2 (two) times daily.  3  . HYDROcodone-acetaminophen (NORCO/VICODIN) 5-325 MG tablet Take 1-2 tablets by mouth every 6 (six) hours as needed for moderate pain or severe pain. 56 tablet 0  . Insulin Pen Needle (B-D UF III MINI PEN NEEDLES) 31G X 5 MM MISC once daily.    . Iron-Vitamin C (VITRON-C) 65-125 MG TABS Take 1 tablet by mouth daily.     [name]. levothyroxine (SYNTHROID, LEVOTHROID) 100 MCG tablet Take 100 mcg by mouth daily.  3  . lisinopril (PRINIVIL,ZESTRIL) 10 MG tablet Take 10 mg by mouth daily.     . Magnesium 500 MG TABS Take 500 mg by mouth daily.    . metFORMIN (GLUCOPHAGE) 1000 MG tablet Take 1,000 mg by mouth 2 (two) times daily.    . Multiple Vitamin (MULTIVITAMIN WITH MINERALS) TABS tablet Take 1 tablet by mouth daily.    . mupirocin ointment (BACTROBAN) 2 % APPLY TO AFFECTED AREA 3 TIMES A DAY  0  . Omega-3 Fatty Acids (FISH OIL) 1000 MG CAPS Take 1,000 mg by mouth daily.    . pantoprazole (PROTONIX) 40 MG tablet Take 40 mg by mouth daily.     . ranitidine (ZANTAC) 300 MG capsule Take 300 mg by mouth at bedtime.  1  . rosuvastatin (CRESTOR) 20 MG tablet Take 20 mg by mouth daily.    . TRESIBA FLEXTOUCH 200 UNIT/ML SOPN INJECT 30 UNITS SUB-Q NIGHTLY  5  . VASCEPA 1 g CAPS Take 1 capsule by mouth 2 (two) times daily.    . vitamin B-12 (CYANOCOBALAMIN) 1000 MCG tablet Take 1,000 mcg by mouth daily.    . vitamin E 400 UNIT capsule Take 400 Units by mouth daily.    [name]. zolpidem (AMBIEN) 5 MG tablet Take 1 tablet (5 mg total) by mouth at bedtime. 30 tablet 0   Current Facility-Administered Medications on File Prior to Visit   Medication Dose Route Frequency Provider Last Rate Last Dose  . aflibercept (EYLEA) SOLN 2 mg  2 mg Intravitreal  Rennis ChrisZamora, Brian, MD   2 mg at 06/27/17 1307  . aflibercept (EYLEA) SOLN 2 mg  2 mg Intravitreal  Rennis ChrisZamora, Brian, MD   2 mg at 07/26/17 1702  . aflibercept (EYLEA) SOLN 2 mg  2 mg Intravitreal  Rennis ChrisZamora, Brian, MD   2 mg at 09/06/17 1700  . aflibercept (EYLEA) SOLN 2 mg  2 mg Intravitreal  Rennis ChrisZamora, Brian, MD   2 mg at 11/01/17 1550  . aflibercept (EYLEA) SOLN 2 mg  2 mg Intravitreal  Rennis ChrisZamora, Brian, MD   2 mg at 01/03/18 1416  . Bevacizumab (AVASTIN) SOLN 1.25 mg  1.25 mg Intravitreal  Rennis ChrisZamora, Brian, MD   1.25 mg at 02/10/17 0953  . Bevacizumab (AVASTIN) SOLN 1.25 mg  1.25 mg Intravitreal  Rennis ChrisZamora, Brian, MD   1.25 mg at 03/13/17 1717  . Bevacizumab (AVASTIN) SOLN 1.25 mg  1.25 mg Intravitreal  [name], MD   1.25 mg at 04/11/17 1626  . Bevacizumab (AVASTIN) SOLN 1.25 mg  1.25 mg Intravitreal  [name], MD   1.25 mg at 02/12/18 2154  . insulin aspart (novoLOG) injection 0-15 Units  0-15 Units Subcutaneous TID WC [name], [name], PA-C      . insulin aspart (novoLOG) injection 0-5 Units  0-5 Units Subcutaneous QHS [name], [name], PA-C        There are no Patient Instructions on file for this visit. No follow-ups on file.   Georgiana Spinner, NP  This note was completed with Office manager.  Any errors are purely unintentional.

## 2018-04-11 ENCOUNTER — Ambulatory Visit (INDEPENDENT_AMBULATORY_CARE_PROVIDER_SITE_OTHER): Payer: Medicare Other | Admitting: Nurse Practitioner

## 2018-04-16 ENCOUNTER — Encounter (INDEPENDENT_AMBULATORY_CARE_PROVIDER_SITE_OTHER): Payer: Medicare Other

## 2018-04-30 ENCOUNTER — Ambulatory Visit (INDEPENDENT_AMBULATORY_CARE_PROVIDER_SITE_OTHER): Payer: Medicare Other | Admitting: Nurse Practitioner

## 2018-05-01 ENCOUNTER — Encounter (INDEPENDENT_AMBULATORY_CARE_PROVIDER_SITE_OTHER): Payer: Self-pay | Admitting: Vascular Surgery

## 2018-05-01 ENCOUNTER — Other Ambulatory Visit: Payer: Self-pay

## 2018-05-01 ENCOUNTER — Ambulatory Visit (INDEPENDENT_AMBULATORY_CARE_PROVIDER_SITE_OTHER): Payer: Medicare Other | Admitting: Vascular Surgery

## 2018-05-01 VITALS — BP 138/72 | HR 89 | Resp 16 | Wt 163.6 lb

## 2018-05-01 DIAGNOSIS — E1165 Type 2 diabetes mellitus with hyperglycemia: Secondary | ICD-10-CM

## 2018-05-01 DIAGNOSIS — I10 Essential (primary) hypertension: Secondary | ICD-10-CM

## 2018-05-01 DIAGNOSIS — I7025 Atherosclerosis of native arteries of other extremities with ulceration: Secondary | ICD-10-CM

## 2018-05-01 NOTE — Assessment & Plan Note (Signed)
Wounds are healing both in the groins and the left foot.  She is doing well. RTC 6-8 weeks and we will do ABIs at that time to determine if we need to do any left leg intervention

## 2018-05-01 NOTE — Assessment & Plan Note (Signed)
blood glucose control important in reducing the progression of atherosclerotic disease. Also, involved in wound healing. On appropriate medications.  

## 2018-05-01 NOTE — Assessment & Plan Note (Signed)
blood pressure control important in reducing the progression of atherosclerotic disease. On appropriate oral medications.  

## 2018-05-01 NOTE — Progress Notes (Signed)
Patient ID: Cheryl Leonard, female   DOB: Dec 10, 1945, 73 y.o.   MRN: [id number]  Chief Complaint  Patient presents with  . Follow-up    4week follow up    HPI Cheryl Leonard is a 73 y.o. female.  Patient returns in follow-up.  Her groin wounds are almost completely healed at this point and are doing quite well.  She is doing well.  Her toe wound is also improving.  She is not having much pain.  No fevers or chills.   Past Medical History:  Diagnosis Date  . Anxiety   . Arthritis   . Cataracts, both eyes   . GERD (gastroesophageal reflux disease)   . Gout   . History of fracture of patella    right knee  . History of positive PPD    Patient always shows positive  . Hyperlipidemia   . Hypertension   . Hypothyroidism   . Lichen sclerosus 12/30/2013   of vulva  . Metatarsal fracture   . Neuropathy   . Peripheral vascular disease (HCC)   . Polyneuropathy    numbness and tingling in feet and toes  . Renal insufficiency   . Type 2 diabetes mellitus, uncontrolled (HCC)     Past Surgical History:  Procedure Laterality Date  . APPENDECTOMY    . BREAST REDUCTION SURGERY  2001  . CATARACT EXTRACTION    . CESAREAN SECTION  1976  . COLONOSCOPY  03/05/2013   Nml - due for repeat 03/06/2018  . DILATION AND CURETTAGE OF UTERUS  1989  . ENDARTERECTOMY FEMORAL Bilateral 03/09/2018   Procedure: ENDARTERECTOMY FEMORAL;  Surgeon: Annice Needy, MD;  Location: [facility];  Service: Vascular;  Laterality: Bilateral;  . ENDARTERECTOMY POPLITEAL Left 03/09/2018   Procedure: ENDARTERECTOMY POPLITEAL AND SFA;  Surgeon: Annice Needy, MD;  Location: [facility];  Service: Vascular;  Laterality: Left;  . ESOPHAGOGASTRODUODENOSCOPY  03/05/2013  . EYE SURGERY    . Eyelid Surgery  2012  . INTRAMEDULLARY (IM) NAIL INTERTROCHANTERIC Left 10/30/2015   Procedure: INTRAMEDULLARY (IM) NAIL INTERTROCHANTRIC ;  Surgeon: Kennedy Bucker, MD;  Location: [facility];  Service: Orthopedics;  Laterality: Left;  .  LAPAROSCOPIC HYSTERECTOMY  2000   total  . LOWER EXTREMITY ANGIOGRAPHY Left 03/08/2017   Procedure: LOWER EXTREMITY ANGIOGRAPHY;  Surgeon: Annice Needy, MD;  Location: [facility];  Service: Cardiovascular;  Laterality: Left;  . LOWER EXTREMITY ANGIOGRAPHY Left 10/30/2017   Procedure: LOWER EXTREMITY ANGIOGRAPHY;  Surgeon: Annice Needy, MD;  Location: [facility];  Service: Cardiovascular;  Laterality: Left;  . LOWER EXTREMITY ANGIOGRAPHY Right 03/08/2018   Procedure: LOWER EXTREMITY ANGIOGRAPHY;  Surgeon: Annice Needy, MD;  Location: [facility];  Service: Cardiovascular;  Laterality: Right;  . PERIPHERAL VASCULAR INTERVENTION  03/08/2018   Procedure: PERIPHERAL VASCULAR INTERVENTION;  Surgeon: Annice Needy, MD;  Location: [facility];  Service: Cardiovascular;;  . REDUCTION MAMMAPLASTY  1997      No Known Allergies  Current Outpatient Medications  Medication Sig Dispense Refill  . aspirin EC 81 MG tablet Take 81 mg by mouth daily.     [name] atorvastatin (LIPITOR) 20 MG tablet Take 20 mg by mouth at bedtime.  3  . buPROPion (WELLBUTRIN XL) 150 MG 24 hr tablet Take 150 mg by mouth daily. Take with 300 mg to equal 450 mg once daily    . buPROPion (WELLBUTRIN XL) 300 MG 24 hr tablet Take 1 tablet (  300 mg total) by mouth daily.    [name] BYSTOLIC 5 MG tablet Take 5 mg by mouth daily.  2  . chlorhexidine (PERIDEX) 0.12 % solution RINSE FOR 30 SECONDS WITH 0.5 OZ TWICE A DAY  0  . cholecalciferol (VITAMIN D) 1000 units tablet Take 1,000 Units by mouth 2 (two) times daily.    [name] CINNAMON PO Take 1 tablet by mouth daily.    . clopidogrel (PLAVIX) 75 MG tablet Take 1 tablet (75 mg total) by mouth daily. 30 tablet 11  . collagenase (SANTYL) ointment Apply 1 application topically daily. 90 g 1  . CORAL CALCIUM PO Take 1 tablet by mouth daily.    [name] denosumab (PROLIA) 60 MG/ML SOLN injection Inject into the skin.    . DENTA 5000 PLUS 1.1 % CREA dental cream 2 (two) times daily.  as directed  99  . DULoxetine (CYMBALTA) 60 MG capsule Take 1 capsule (60 mg total) by mouth 2 (two) times daily.  3  . ferrous sulfate 325 (65 FE) MG tablet Take 1 tablet (325 mg total) by mouth daily with breakfast. 30 tablet 3  . gemfibrozil (LOPID) 600 MG tablet Take 600 mg by mouth 2 (two) times daily.  3  . HYDROcodone-acetaminophen (NORCO/VICODIN) 5-325 MG tablet Take 1-2 tablets by mouth every 6 (six) hours as needed for moderate pain or severe pain. 56 tablet 0  . Insulin Pen Needle (B-D UF III MINI PEN NEEDLES) 31G X 5 MM MISC once daily.    . Iron-Vitamin C (VITRON-C) 65-125 MG TABS Take 1 tablet by mouth daily.     [name] levothyroxine (SYNTHROID, LEVOTHROID) 100 MCG tablet Take 100 mcg by mouth daily.  3  . lisinopril (PRINIVIL,ZESTRIL) 10 MG tablet Take 10 mg by mouth daily.     . Magnesium 500 MG TABS Take 500 mg by mouth daily.    . metFORMIN (GLUCOPHAGE) 1000 MG tablet Take 1,000 mg by mouth 2 (two) times daily.    . Multiple Vitamin (MULTIVITAMIN WITH MINERALS) TABS tablet Take 1 tablet by mouth daily.    . mupirocin ointment (BACTROBAN) 2 % APPLY TO AFFECTED AREA 3 TIMES A DAY  0  . Omega-3 Fatty Acids (FISH OIL) 1000 MG CAPS Take 1,000 mg by mouth daily.    . pantoprazole (PROTONIX) 40 MG tablet Take 40 mg by mouth daily.     . ranitidine (ZANTAC) 300 MG capsule Take 300 mg by mouth at bedtime.  1  . rosuvastatin (CRESTOR) 20 MG tablet Take 20 mg by mouth daily.    . TRESIBA FLEXTOUCH 200 UNIT/ML SOPN INJECT 30 UNITS SUB-Q NIGHTLY  5  . VASCEPA 1 g CAPS Take 1 capsule by mouth 2 (two) times daily.    . vitamin B-12 (CYANOCOBALAMIN) 1000 MCG tablet Take 1,000 mcg by mouth daily.    . vitamin E 400 UNIT capsule Take 400 Units by mouth daily.    [name] zolpidem (AMBIEN) 5 MG tablet Take 1 tablet (5 mg total) by mouth at bedtime. 30 tablet 0   Current Facility-Administered Medications  Medication Dose Route Frequency Provider Last Rate Last Dose  . aflibercept (EYLEA) SOLN 2 mg  2 mg  Intravitreal  [name], MD   2 mg at 06/27/17 1307  . aflibercept (EYLEA) SOLN 2 mg  2 mg Intravitreal  [name], MD   2 mg at 07/26/17 1702  . aflibercept (EYLEA) SOLN 2 mg  2 mg Intravitreal  [name], MD   2 mg  at 09/06/17 1700  . aflibercept (EYLEA) SOLN 2 mg  2 mg Intravitreal  Rennis ChrisZamora, Brian, MD   2 mg at 11/01/17 1550  . aflibercept (EYLEA) SOLN 2 mg  2 mg Intravitreal  Rennis ChrisZamora, Brian, MD   2 mg at 01/03/18 1416  . Bevacizumab (AVASTIN) SOLN 1.25 mg  1.25 mg Intravitreal  Rennis ChrisZamora, Brian, MD   1.25 mg at 02/10/17 0953  . Bevacizumab (AVASTIN) SOLN 1.25 mg  1.25 mg Intravitreal  Rennis ChrisZamora, Brian, MD   1.25 mg at 03/13/17 1717  . Bevacizumab (AVASTIN) SOLN 1.25 mg  1.25 mg Intravitreal  Rennis ChrisZamora, Brian, MD   1.25 mg at 04/11/17 1626  . Bevacizumab (AVASTIN) SOLN 1.25 mg  1.25 mg Intravitreal  Rennis ChrisZamora, Brian, MD   1.25 mg at 02/12/18 2154  . insulin aspart (novoLOG) injection 0-15 Units  0-15 Units Subcutaneous TID WC [name], [name], PA-C      . insulin aspart (novoLOG) injection 0-5 Units  0-5 Units Subcutaneous QHS [name], Ranae PlumberKimberly A, PA-C            Physical Exam BP 138/72 (BP Location: Right Arm)   Pulse 89   Resp 16   Wt 163 lb 9.6 oz (74.2 kg)   BMI 27.22 kg/m  Gen:  WD/WN, NAD Skin: incision basically healed on the right and with only a tiny strip of opening on the left.  No erythema or drainage.     Assessment/Plan:  Essential hypertension blood pressure control important in reducing the progression of atherosclerotic disease. On appropriate oral medications.   Diabetes mellitus type 2, uncontrolled blood glucose control important in reducing the progression of atherosclerotic disease. Also, involved in wound healing. On appropriate medications.   Atherosclerosis of native arteries of the extremities with ulceration (HCC) Wounds are healing both in the groins and the left foot.  She is doing well. RTC 6-8 weeks and we will do ABIs at that time to  determine if we need to do any left leg intervention      Festus BarrenJason Ericha Whittingham 05/01/2018, 12:02 PM   This note was created with Dragon medical transcription system.  Any errors from dictation are unintentional.

## 2018-05-07 ENCOUNTER — Encounter (INDEPENDENT_AMBULATORY_CARE_PROVIDER_SITE_OTHER): Payer: Medicare Other | Admitting: Ophthalmology

## 2018-05-07 NOTE — Progress Notes (Signed)
[facility] - Clinic Note  05/08/2018     CHIEF COMPLAINT Patient presents for Retina Follow Up   HISTORY OF PRESENT ILLNESS: Cheryl Leonard is a 73 y.o. female who presents to the clinic today for:   HPI    Retina Follow Up    Patient presents with  CRVO/BRVO.  In right eye.  This started months ago.  Severity is moderate.  Duration of 4 weeks.  Since onset it is gradually improving.  I, the attending physician,  performed the HPI with the patient and updated documentation appropriately.          Comments    73 y/o female pt here for 4 wk f/u for BRVO w/CME OD.  Pt has not noticed any change in [place] OU.  Denies pain, flashes, floaters.  No gtts.  BS 117 this a.m.  A1C 6.7 yesterday.       Last edited by [name], MD on 05/08/2018  2:28 PM. (History)    Patient states no change in vision.    Referring physician: Danella Penton, MD [phone] [facility] [address] [facility] [place], [place] [address]  HISTORICAL INFORMATION:   Selected notes from the MEDICAL RECORD NUMBER Referred by Dr. Senaida Ores for concern of DME OD;  LEE- 02.05.19 [BCVA OD: 20/70-1 OS: 20/30-1] Ocular Hx-  PMH- DM Lab Results  Component Value Date   HGBA1C 5.9 (H) 10/29/2015       CURRENT MEDICATIONS: No current outpatient medications on file. (Ophthalmic Drugs)   Current Facility-Administered Medications (Ophthalmic Drugs)  Medication Route  . aflibercept (EYLEA) SOLN 2 mg Intravitreal  . aflibercept (EYLEA) SOLN 2 mg Intravitreal  . aflibercept (EYLEA) SOLN 2 mg Intravitreal  . aflibercept (EYLEA) SOLN 2 mg Intravitreal  . aflibercept (EYLEA) SOLN 2 mg Intravitreal   Current Outpatient Medications (Other)  Medication Sig  . ALPRAZolam (XANAX) 0.25 MG tablet Take by mouth.  [name] aspirin EC 81 MG tablet Take 81 mg by mouth daily.   [name] atorvastatin (LIPITOR) 20 MG tablet Take 20 mg by mouth at bedtime.  [name] buPROPion (WELLBUTRIN XL) 150 MG 24 hr tablet Take  150 mg by mouth daily. Take with 300 mg to equal 450 mg once daily  . buPROPion (WELLBUTRIN XL) 300 MG 24 hr tablet Take 1 tablet (300 mg total) by mouth daily.  [name] BYSTOLIC 5 MG tablet Take 5 mg by mouth daily.  . chlorhexidine (PERIDEX) 0.12 % solution RINSE FOR 30 SECONDS WITH 0.5 OZ TWICE A DAY  . cholecalciferol (VITAMIN D) 1000 units tablet Take 1,000 Units by mouth 2 (two) times daily.  [name] CINNAMON PO Take 1 tablet by mouth daily.  . clopidogrel (PLAVIX) 75 MG tablet Take 1 tablet (75 mg total) by mouth daily.  . collagenase (SANTYL) ointment Apply 1 application topically daily.  [name] CORAL CALCIUM PO Take 1 tablet by mouth daily.  [name] denosumab (PROLIA) 60 MG/ML SOLN injection Inject into the skin.  . DENTA 5000 PLUS 1.1 % CREA dental cream 2 (two) times daily. as directed  . DULoxetine (CYMBALTA) 60 MG capsule Take 1 capsule (60 mg total) by mouth 2 (two) times daily.  . ferrous sulfate 325 (65 FE) MG tablet Take 1 tablet (325 mg total) by mouth daily with breakfast.  . furosemide (LASIX) 20 MG tablet Take by mouth.  [name] gemfibrozil (LOPID) 600 MG tablet Take 600 mg by mouth 2 (two) times daily.  [name] HYDROcodone-acetaminophen (NORCO/VICODIN) 5-325 MG tablet  Take 1-2 tablets by mouth every 6 (six) hours as needed for moderate pain or severe pain.  . Insulin Pen Needle (B-D UF III MINI PEN NEEDLES) 31G X 5 MM MISC once daily.  . Iron-Vitamin C (VITRON-C) 65-125 MG TABS Take 1 tablet by mouth daily.   [name] levothyroxine (SYNTHROID, LEVOTHROID) 100 MCG tablet Take 100 mcg by mouth daily.  [name] lisinopril (PRINIVIL,ZESTRIL) 10 MG tablet Take 10 mg by mouth daily.   . Magnesium 500 MG TABS Take 500 mg by mouth daily.  . metFORMIN (GLUCOPHAGE) 1000 MG tablet Take 1,000 mg by mouth 2 (two) times daily.  . Multiple Vitamin (MULTIVITAMIN WITH MINERALS) TABS tablet Take 1 tablet by mouth daily.  . mupirocin ointment (BACTROBAN) 2 % APPLY TO AFFECTED AREA 3 TIMES A DAY  . Omega-3 Fatty Acids (FISH OIL) 1000 MG  CAPS Take 1,000 mg by mouth daily.  . pantoprazole (PROTONIX) 40 MG tablet Take 40 mg by mouth daily.   . ranitidine (ZANTAC) 300 MG tablet Take 300 mg by mouth at bedtime.  . rosuvastatin (CRESTOR) 20 MG tablet Take 20 mg by mouth daily.  . sitaGLIPtin-metformin (JANUMET) 50-1000 MG tablet Take by mouth.  . TRESIBA FLEXTOUCH 200 UNIT/ML SOPN INJECT 30 UNITS SUB-Q NIGHTLY  . VASCEPA 1 g CAPS Take 1 capsule by mouth 2 (two) times daily.  . vitamin B-12 (CYANOCOBALAMIN) 1000 MCG tablet Take 1,000 mcg by mouth daily.  . vitamin E 400 UNIT capsule Take 400 Units by mouth daily.  [name] zolpidem (AMBIEN) 5 MG tablet Take 1 tablet (5 mg total) by mouth at bedtime.  . ranitidine (ZANTAC) 300 MG capsule Take 300 mg by mouth at bedtime.   Current Facility-Administered Medications (Other)  Medication Route  . Bevacizumab (AVASTIN) SOLN 1.25 mg Intravitreal  . Bevacizumab (AVASTIN) SOLN 1.25 mg Intravitreal  . Bevacizumab (AVASTIN) SOLN 1.25 mg Intravitreal  . Bevacizumab (AVASTIN) SOLN 1.25 mg Intravitreal  . insulin aspart (novoLOG) injection 0-15 Units Subcutaneous  . insulin aspart (novoLOG) injection 0-5 Units Subcutaneous      REVIEW OF SYSTEMS: ROS    Positive for: Endocrine, Cardiovascular, Eyes   Negative for: Constitutional, Gastrointestinal, Neurological, Skin, Genitourinary, Musculoskeletal, HENT, Respiratory, Psychiatric, Allergic/Imm, Heme/Lymph   Last edited by Celine Mans, COA on 05/08/2018  1:35 PM. (History)       ALLERGIES No Known Allergies  PAST MEDICAL HISTORY Past Medical History:  Diagnosis Date  . Anxiety   . Arthritis   . Cataracts, both eyes   . Diabetic retinopathy (HCC)    NPDR OU  . GERD (gastroesophageal reflux disease)   . Gout   . History of fracture of patella    right knee  . History of positive PPD    Patient always shows positive  . Hyperlipidemia   . Hypertension   . Hypertensive retinopathy    OU  . Hypothyroidism   . Lichen sclerosus  12/30/2013   of vulva  . Metatarsal fracture   . Neuropathy   . Peripheral vascular disease (HCC)   . Polyneuropathy    numbness and tingling in feet and toes  . Renal insufficiency   . Type 2 diabetes mellitus, uncontrolled (HCC)    Past Surgical History:  Procedure Laterality Date  . APPENDECTOMY    . BREAST REDUCTION SURGERY  2001  . CATARACT EXTRACTION    . CESAREAN SECTION  1976  . COLONOSCOPY  03/05/2013   Nml - due for repeat 03/06/2018  . DILATION AND CURETTAGE OF  UTERUS  1989  . ENDARTERECTOMY FEMORAL Bilateral 03/09/2018   Procedure: ENDARTERECTOMY FEMORAL;  Surgeon: Annice Needy, MD;  Location: [facility] ORS;  Service: Vascular;  Laterality: Bilateral;  . ENDARTERECTOMY POPLITEAL Left 03/09/2018   Procedure: ENDARTERECTOMY POPLITEAL AND SFA;  Surgeon: Annice Needy, MD;  Location: [facility] ORS;  Service: Vascular;  Laterality: Left;  . ESOPHAGOGASTRODUODENOSCOPY  03/05/2013  . EYE SURGERY    . Eyelid Surgery  2012  . INTRAMEDULLARY (IM) NAIL INTERTROCHANTERIC Left 10/30/2015   Procedure: INTRAMEDULLARY (IM) NAIL INTERTROCHANTRIC ;  Surgeon: Kennedy Bucker, MD;  Location: [facility] ORS;  Service: Orthopedics;  Laterality: Left;  . LAPAROSCOPIC HYSTERECTOMY  2000   total  . LOWER EXTREMITY ANGIOGRAPHY Left 03/08/2017   Procedure: LOWER EXTREMITY ANGIOGRAPHY;  Surgeon: Annice Needy, MD;  Location: [facility] INVASIVE CV LAB;  Service: Cardiovascular;  Laterality: Left;  . LOWER EXTREMITY ANGIOGRAPHY Left 10/30/2017   Procedure: LOWER EXTREMITY ANGIOGRAPHY;  Surgeon: Annice Needy, MD;  Location: [facility] INVAS[facility] CV LAB;  Service: Cardiovascular;  Laterality: Left;  . LOWER EXTREMITY ANGIOGRAPHY Right 03/08/2018   Procedure: LOWER EXTREMITY ANGIOGRAPHY;  Surgeon: Annice Needy, MD;  Location: [facility] INVASIVE CV LAB;  Service: Cardiovascular;  Laterality: Right;  . PERIPHERAL VASCULAR INTERVENTION  03/08/2018   Procedure: PERIPHERAL VASCULAR INTERVENTION;  Surgeon: Annice Needy, MD;  Location: [facility] INVASIVE CV LAB;   Service: Cardiovascular;;  . REDUCTION MAMMAPLASTY  1997    FAMILY HISTORY Family History  Problem Relation Age of Onset  . Coronary artery disease Father   . Heart attack Father   . Coronary artery disease Mother   . Heart attack Mother   . Ovarian cancer Sister 61       sister had hormonal therapy for IVF txs-which increased risk factor for ovarian cancer  . Breast cancer Neg Hx     SOCIAL HISTORY Social History   Tobacco Use  . Smoking status: Former Smoker    Packs/day: 1.00    Years: 20.00    Pack years: 20.00    Types: Cigarettes    Last attempt to quit: 03/07/1996    Years since quitting: 22.1  . Smokeless tobacco: Never Used  . Tobacco comment: started smoking at age 9  Substance Use Topics  . Alcohol use: No    Alcohol/week: 0.0 standard drinks  . Drug use: No         OPHTHALMIC EXAM:  Base Eye Exam    Visual Acuity (Snellen - Linear)      Right Left   Dist [place] 20/50 20/25 +2   Dist ph [place] NI NI       Tonometry (Tonopen, 1:40 PM)      Right Left   Pressure 14 14       Pupils      Dark Light Shape React APD   Right 3 2 Round Brisk None   Left 3 2 Round Brisk None       Visual Fields (Counting fingers)      Left Right    Full Full       Extraocular Movement      Right Left    Full, Ortho Full, Ortho       Neuro/Psych    Oriented x3:  Yes   Mood/Affect:  Normal       Dilation    Both eyes:  1.0% Mydriacyl, 2.5% Phenylephrine @ 1:41 PM        Slit Lamp and Fundus Exam  External Exam      Right Left   External Normal Normal       Slit Lamp Exam      Right Left   Lids/Lashes dermatochalasis dermatochalasis   Conjunctiva/Sclera White and quiet White and quiet   Cornea arcus; well healed cataract wound; 1+ Punctate epithelial erosions, decreased TBUT, mild Anterior basement membrane dystrophy superiorly arcus; well healed cataract wound, 1+ Punctate epithelial erosions, irregualr epi surface, decreased TBUT   Anterior Chamber  Deep and quiet Deep and quiet   Iris Round and dilated Round and dilated   Lens PCIOL; open PC PCIOL; open PC   Vitreous syneresis, Posterior vitreous detachment, vitreous condensations inferiorly syneresis, Posterior vitreous detachment       Fundus Exam      Right Left   Disc Superior hyperemia and mild edema, +IRH superior disc Pink and Sharp   C/D Ratio 0.4 0.5   Macula Blunted foveal reflex, interval improvement in central CME, +Epiretinal membrane flat; no heme or edema   Vessels Vascular attenuation, Tortuous Vascular attenuation   Periphery attached; 360 Mas/DBH peripherally attached          IMAGING AND PROCEDURES  Imaging and Procedures for 04/25/17  OCT, Retina - OU - Both Eyes       Right Eye Quality was good. Central Foveal Thickness: 366. Progression has improved. Findings include abnormal foveal contour, epiretinal membrane, no SRF, intraretinal fluid (Massive interval improvement in IRF and foveal contour).   Left Eye Quality was good. Central Foveal Thickness: 286. Progression has been stable. Findings include normal foveal contour, no IRF, no SRF.   Notes *Images captured and stored on drive  Diagnosis / Impression:  OD: +IRF/CME centrally; no SRF -- massive interval improvement in IRF and foveal contour OS: NFP; no IRF/SRF--stable  Clinical management:  See below  Abbreviations: NFP - Normal foveal profile. CME - cystoid macular edema. PED - pigment epithelial detachment. IRF - intraretinal fluid. SRF - subretinal fluid. EZ - ellipsoid zone. ERM - epiretinal membrane. ORA - outer retinal atrophy. ORT - outer retinal tubulation. SRHM - subretinal hyper-reflective material        Intravitreal Injection, Pharmacologic Agent - OD - Right Eye       Time Out 05/08/2018. 2:35 PM. Confirmed correct patient, procedure, site, and patient consented.   Anesthesia Topical anesthesia was used. Anesthetic medications included Lidocaine 2%, Proparacaine 0.5%.    Procedure Preparation included 5% betadine to ocular surface, eyelid speculum. A 30 gauge needle was used.   Injection:  2 mg aflibercept Gretta Cool(EYLEA) SOLN   NDC: [id number], Lot: [id number], Expiration date: 09/02/2018   Route: Intravitreal, Site: Right Eye, Waste: 0 mL  Post-op Post injection exam found visual acuity of at least counting fingers. The patient tolerated the procedure well. There were no complications. The patient received written and verbal post procedure care education.                 ASSESSMENT/PLAN:    ICD-10-CM   1. Branch retinal vein occlusion of right eye with macular edema H34.8310 Intravitreal Injection, Pharmacologic Agent - OD - Right Eye    aflibercept (EYLEA) SOLN 2 mg  2. Retinal edema H35.81 OCT, Retina - OU - Both Eyes  3. Both eyes affected by mild nonproliferative diabetic retinopathy with macular edema, associated with type 2 diabetes mellitus (HCC) [id number]   4. Essential hypertension I10   5. Hypertensive retinopathy of both eyes H35.033   6. Epiretinal membrane (ERM) of right  eye H35.371   7. Pseudophakia of both eyes Z96.1   8. 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 Floaters in visual field, right H43.391     1,2. CME OD -- likely remote BRVO OD  - by history, pt states symptoms first noticed 2 wks prior to presentation, but reports changes may have occurred prior  - initial exam with differential tortuosity of vessels (OD > OS)  - FA (02.10.20) shows mild late staining / leakage in macula, staining / leakage of disc -- improving CME  - differential includes DM2 (DME), hypertensive retinopathy, inflammatory etiology / uveitis  - S/P IVA OD #1 (02.08.19), #2 (03.11.19), #3 (04.09.19), #4 (05.20.19), #5 (02.10.20)  - review of OCTs show interval increase in CME OD after initial improvement post IVA #1 --  resistance to IVA   - June 2019 -- switched therapies: S/P IVE OD #1 (06.24.19), #2 (07.24.19), #3 (09.04.19), #4 (10.30.19),#5  (12.30.19), #6 (03.23.20) - gave IVA OS on 2.10.20 due to pending Eylea4U for 2020 -- resulted in increased IRF/CME - OCT today (restarted IVE OS) shows massive interval improvement in IRF - BCVA improved to 20/50 OD today (up from 20/70 on 03.23.20)  - Eylea4U benefits investigation for 2020 now completed and pt approved for IVE - recommend IVE OD #7 (05.05.20) - RBA of procedure discussed, questions answered - informed consent obtained and signed - see procedure note - F/U 5-6 weeks -- DFE/OCT  3. Mild nonproliferative diabetic retinopathy, both eyes - The incidence, risk factors for progression, natural history and treatment options for diabetic retinopathy were discussed with patient.   - The need for close monitoring of blood glucose, blood pressure, and serum lipids, avoiding cigarette or any type of tobacco, and the need for long term follow up was also discussed with patient. - could be contributing to CME OD - OS with minimal diabetic retinopathy - continue to monitor  4,5. Hypertensive retinopathy OU - stable - as above, may have contributing to CME OD - discussed importance of tight BP control - monitor  6. Epiretinal membrane, right eye  The natural history, anatomy, potential for loss of vision, and treatment options including vitrectomy techniques and the complications of endophthalmitis, retinal detachment, vitreous hemorrhage, cataract progression and permanent vision loss discussed with the patient. - stable - no indication for surgery at this time  7. Pseudophakia OU - s/p CE/IOL OU by cataract surgeon in [facility] - doing well - monitor  8. Optic disc edema OD -- sectoral - likely secondary to BRVO but differential includes carotid stenosis and retro-orbital mass - history of blood clots - recommend CT orbits w/ contrast to r/o retro-orbital mass -- not obtained - recommend carotid dopplers to r/o stenosis / occlusion -- pt scheduled for repeat u/s due to recent  bilateral femoral endarterectomies -- strong history of peripheral vascular disease   Ophthalmic Meds Ordered this visit:  Meds ordered this encounter  Medications  . aflibercept (EYLEA) SOLN 2 mg       Return for 5-6 wks - f/u BRVO w/ CME OD - DFE, OCT, Possible Injxn.  There are no Patient Instructions on file for this visit.   Explained the diagnoses, plan, and follow up with the patient and they expressed understanding.  Patient expressed understanding of the importance of proper follow up care.   This document serves as a record of services personally performed by Karie Chimera, MD, PhD. It was created on their behalf by Marchelle Folks  Manson Passey OA, an ophthalmic assistant. The creation of this record is the provider's dictation and/or activities during the visit.    Electronically signed by: Laurian Brim, OA  05.04.2020 2:37 PM    Karie Chimera, M.D., Ph.D. Diseases & Surgery of the Retina and Vitreous [facility]  I have reviewed the above documentation for accuracy and completeness, and I agree with the above. Karie Chimera, M.D., Ph.D. 05/08/18 2:40 PM   Abbreviations: M myopia (nearsighted); A astigmatism; H hyperopia (farsighted); P presbyopia; Mrx spectacle prescription;  CTL contact lenses; OD right eye; OS left eye; OU both eyes  XT exotropia; ET esotropia; PEK punctate epithelial keratitis; PEE punctate epithelial erosions; DES dry eye syndrome; MGD meibomian gland dysfunction; ATs artificial tears; PFAT's preservative free artificial tears; NSC nuclear sclerotic cataract; PSC posterior subcapsular cataract; ERM epi-retinal membrane; PVD posterior vitreous detachment; RD retinal detachment; DM diabetes mellitus; DR diabetic retinopathy; NPDR non-proliferative diabetic retinopathy; PDR proliferative diabetic retinopathy; CSME clinically significant macular edema; DME diabetic macular edema; dbh dot blot hemorrhages; CWS cotton wool spot; POAG primary  open angle glaucoma; C/D cup-to-disc ratio; HVF humphrey visual field; GVF goldmann visual field; OCT optical coherence tomography; IOP intraocular pressure; BRVO Branch retinal vein occlusion; CRVO central retinal vein occlusion; CRAO central retinal artery occlusion; BRAO branch retinal artery occlusion; RT retinal tear; SB scleral buckle; PPV pars plana vitrectomy; VH Vitreous hemorrhage; PRP panretinal laser photocoagulation; IVK intravitreal kenalog; VMT vitreomacular traction; MH Macular hole;  NVD neovascularization of the disc; NVE neovascularization elsewhere; AREDS age related eye disease study; ARMD age related macular degeneration; POAG primary open angle glaucoma; EBMD epithelial/anterior basement membrane dystrophy; ACIOL anterior chamber intraocular lens; IOL intraocular lens; PCIOL posterior chamber intraocular lens; Phaco/IOL phacoemulsification with intraocular lens placement; PRK photorefractive keratectomy; LASIK laser assisted in situ keratomileusis; HTN hypertension; DM diabetes mellitus; COPD chronic obstructive pulmonary disease

## 2018-05-08 ENCOUNTER — Other Ambulatory Visit: Payer: Self-pay

## 2018-05-08 ENCOUNTER — Ambulatory Visit (INDEPENDENT_AMBULATORY_CARE_PROVIDER_SITE_OTHER): Payer: Medicare Other | Admitting: Ophthalmology

## 2018-05-08 ENCOUNTER — Encounter (INDEPENDENT_AMBULATORY_CARE_PROVIDER_SITE_OTHER): Payer: Self-pay | Admitting: Ophthalmology

## 2018-05-08 DIAGNOSIS — H3581 Retinal edema: Secondary | ICD-10-CM | POA: Diagnosis not present

## 2018-05-08 DIAGNOSIS — Z961 Presence of intraocular lens: Secondary | ICD-10-CM

## 2018-05-08 DIAGNOSIS — I10 Essential (primary) hypertension: Secondary | ICD-10-CM

## 2018-05-08 DIAGNOSIS — H34831 Tributary (branch) retinal vein occlusion, right eye, with macular edema: Secondary | ICD-10-CM

## 2018-05-08 DIAGNOSIS — E113213 Type 2 diabetes mellitus with mild nonproliferative diabetic retinopathy with macular edema, bilateral: Secondary | ICD-10-CM | POA: Diagnosis not present

## 2018-05-08 DIAGNOSIS — H35371 Puckering of macula, right eye: Secondary | ICD-10-CM

## 2018-05-08 DIAGNOSIS — H35033 Hypertensive retinopathy, bilateral: Secondary | ICD-10-CM

## 2018-05-08 DIAGNOSIS — H4710 Unspecified papilledema: Secondary | ICD-10-CM

## 2018-05-08 DIAGNOSIS — H43391 Other vitreous opacities, right eye: Secondary | ICD-10-CM

## 2018-05-08 DIAGNOSIS — M810 Age-related osteoporosis without current pathological fracture: Secondary | ICD-10-CM | POA: Insufficient documentation

## 2018-05-08 MED ORDER — AFLIBERCEPT 2MG/0.05ML IZ SOLN FOR KALEIDOSCOPE
2.0000 mg | INTRAVITREAL | Status: AC | PRN
  Administered 2018-05-08: 14:00:00 2 mg via INTRAVITREAL

## 2018-06-11 NOTE — Progress Notes (Signed)
[facility] - Clinic Note  06/12/2018     CHIEF COMPLAINT Patient presents for Retina Follow Up   HISTORY OF PRESENT ILLNESS: Cheryl Leonard is a 73 y.o. female who presents to the clinic today for:   HPI    Retina Follow Up    Patient presents with  CRVO/BRVO.  In right eye.  Severity is moderate.  Duration of 5 weeks.  Since onset it is stable.  I, the attending physician,  performed the HPI with the patient and updated documentation appropriately.          Comments    Patient states vision the same OU. BS was 120 this am. Last a1c was 6.0, checked last month. BP under good control per patient.        Last edited by [name], MD on 06/12/2018  3:32 PM. (History)    Patient states no change in vision    Referring physician: Danella Penton, MD [address] [facility] [address] [facility] [place], [place] [address]  HISTORICAL INFORMATION:   Selected notes from the MEDICAL RECORD NUMBER Referred by Dr. Senaida Ores for concern of DME OD;  LEE- 02.05.19 [BCVA OD: 20/70-1 OS: 20/30-1] Ocular Hx-  PMH- DM Lab Results  Component Value Date   HGBA1C 5.9 (H) 10/29/2015       CURRENT MEDICATIONS: No current outpatient medications on file. (Ophthalmic Drugs)   Current Facility-Administered Medications (Ophthalmic Drugs)  Medication Route  . aflibercept (EYLEA) SOLN 2 mg Intravitreal  . aflibercept (EYLEA) SOLN 2 mg Intravitreal  . aflibercept (EYLEA) SOLN 2 mg Intravitreal  . aflibercept (EYLEA) SOLN 2 mg Intravitreal  . aflibercept (EYLEA) SOLN 2 mg Intravitreal   Current Outpatient Medications (Other)  Medication Sig  . ALPRAZolam (XANAX) 0.25 MG tablet Take by mouth.  [name] aspirin EC 81 MG tablet Take 81 mg by mouth daily.   [name] atorvastatin (LIPITOR) 20 MG tablet Take 20 mg by mouth at bedtime.  [name] buPROPion (WELLBUTRIN XL) 150 MG 24 hr tablet Take 150 mg by mouth daily. Take with 300 mg to equal 450 mg once daily  . buPROPion  (WELLBUTRIN XL) 300 MG 24 hr tablet Take 1 tablet (300 mg total) by mouth daily.  [name] BYSTOLIC 5 MG tablet Take 5 mg by mouth daily.  . chlorhexidine (PERIDEX) 0.12 % solution RINSE FOR 30 SECONDS WITH 0.5 OZ TWICE A DAY  . cholecalciferol (VITAMIN D) 1000 units tablet Take 1,000 Units by mouth 2 (two) times daily.  [name] CINNAMON PO Take 1 tablet by mouth daily.  . clopidogrel (PLAVIX) 75 MG tablet Take 1 tablet (75 mg total) by mouth daily.  . collagenase (SANTYL) ointment Apply 1 application topically daily.  [name] CORAL CALCIUM PO Take 1 tablet by mouth daily.  [name] denosumab (PROLIA) 60 MG/ML SOLN injection Inject into the skin.  . DENTA 5000 PLUS 1.1 % CREA dental cream 2 (two) times daily. as directed  . DULoxetine (CYMBALTA) 60 MG capsule Take 1 capsule (60 mg total) by mouth 2 (two) times daily.  . ferrous sulfate 325 (65 FE) MG tablet Take 1 tablet (325 mg total) by mouth daily with breakfast.  . furosemide (LASIX) 20 MG tablet Take by mouth.  [name] gemfibrozil (LOPID) 600 MG tablet Take 600 mg by mouth 2 (two) times daily.  [name] HYDROcodone-acetaminophen (NORCO/VICODIN) 5-325 MG tablet Take 1-2 tablets by mouth every 6 (six) hours as needed for moderate pain or severe pain.  . Insulin Pen  Needle (B-D UF III MINI PEN NEEDLES) 31G X 5 MM MISC once daily.  . Iron-Vitamin C (VITRON-C) 65-125 MG TABS Take 1 tablet by mouth daily.   [name]. levothyroxine (SYNTHROID, LEVOTHROID) 100 MCG tablet Take 100 mcg by mouth daily.  [name]. lisinopril (PRINIVIL,ZESTRIL) 10 MG tablet Take 10 mg by mouth daily.   . Magnesium 500 MG TABS Take 500 mg by mouth daily.  . Multiple Vitamin (MULTIVITAMIN WITH MINERALS) TABS tablet Take 1 tablet by mouth daily.  . mupirocin ointment (BACTROBAN) 2 % APPLY TO AFFECTED AREA 3 TIMES A DAY  . Omega-3 Fatty Acids (FISH OIL) 1000 MG CAPS Take 1,000 mg by mouth daily.  . pantoprazole (PROTONIX) 40 MG tablet Take 40 mg by mouth daily.   . ranitidine (ZANTAC) 300 MG capsule Take 300 mg by mouth at  bedtime.  . ranitidine (ZANTAC) 300 MG tablet Take 300 mg by mouth at bedtime.  . rosuvastatin (CRESTOR) 20 MG tablet Take 20 mg by mouth daily.  . sitaGLIPtin-metformin (JANUMET) 50-1000 MG tablet Take by mouth.  . TRESIBA FLEXTOUCH 200 UNIT/ML SOPN INJECT 30 UNITS SUB-Q NIGHTLY  . VASCEPA 1 g CAPS Take 1 capsule by mouth 2 (two) times daily.  . vitamin B-12 (CYANOCOBALAMIN) 1000 MCG tablet Take 1,000 mcg by mouth daily.  . vitamin E 400 UNIT capsule Take 400 Units by mouth daily.  [name]. zolpidem (AMBIEN) 5 MG tablet Take 1 tablet (5 mg total) by mouth at bedtime.  . metFORMIN (GLUCOPHAGE) 1000 MG tablet Take 1,000 mg by mouth 2 (two) times daily.   Current Facility-Administered Medications (Other)  Medication Route  . Bevacizumab (AVASTIN) SOLN 1.25 mg Intravitreal  . Bevacizumab (AVASTIN) SOLN 1.25 mg Intravitreal  . Bevacizumab (AVASTIN) SOLN 1.25 mg Intravitreal  . Bevacizumab (AVASTIN) SOLN 1.25 mg Intravitreal  . insulin aspart (novoLOG) injection 0-15 Units Subcutaneous  . insulin aspart (novoLOG) injection 0-5 Units Subcutaneous      REVIEW OF SYSTEMS: ROS    Positive for: Endocrine, Cardiovascular, Eyes   Negative for: Constitutional, Gastrointestinal, Neurological, Skin, Genitourinary, Musculoskeletal, HENT, Respiratory, Psychiatric, Allergic/Imm, Heme/Lymph   Last edited by Annalee GentaBarber, Daryl D on 06/12/2018  1:50 PM. (History)       ALLERGIES No Known Allergies  PAST MEDICAL HISTORY Past Medical History:  Diagnosis Date  . Anxiety   . Arthritis   . Cataracts, both eyes   . Diabetic retinopathy (HCC)    NPDR OU  . GERD (gastroesophageal reflux disease)   . Gout   . History of fracture of patella    right knee  . History of positive PPD    Patient always shows positive  . Hyperlipidemia   . Hypertension   . Hypertensive retinopathy    OU  . Hypothyroidism   . Lichen sclerosus 12/30/2013   of vulva  . Metatarsal fracture   . Neuropathy   . Peripheral vascular  disease (HCC)   . Polyneuropathy    numbness and tingling in feet and toes  . Renal insufficiency   . Type 2 diabetes mellitus, uncontrolled (HCC)    Past Surgical History:  Procedure Laterality Date  . APPENDECTOMY    . BREAST REDUCTION SURGERY  2001  . CATARACT EXTRACTION    . CESAREAN SECTION  1976  . COLONOSCOPY  03/05/2013   Nml - due for repeat 03/06/2018  . DILATION AND CURETTAGE OF UTERUS  1989  . ENDARTERECTOMY FEMORAL Bilateral 03/09/2018   Procedure: ENDARTERECTOMY FEMORAL;  Surgeon: Annice Needyew, Jason S, MD;  Location:  [facility] ORS;  Service: Vascular;  Laterality: Bilateral;  . ENDARTERECTOMY POPLITEAL Left 03/09/2018   Procedure: ENDARTERECTOMY POPLITEAL AND SFA;  Surgeon: Algernon Huxley, MD;  Location: [facility] ORS;  Service: Vascular;  Laterality: Left;  . ESOPHAGOGASTRODUODENOSCOPY  03/05/2013  . EYE SURGERY    . Eyelid Surgery  2012  . INTRAMEDULLARY (IM) NAIL INTERTROCHANTERIC Left 10/30/2015   Procedure: INTRAMEDULLARY (IM) NAIL INTERTROCHANTRIC ;  Surgeon: Hessie Knows, MD;  Location: [facility] ORS;  Service: Orthopedics;  Laterality: Left;  . LAPAROSCOPIC HYSTERECTOMY  2000   total  . LOWER EXTREMITY ANGIOGRAPHY Left 03/08/2017   Procedure: LOWER EXTREMITY ANGIOGRAPHY;  Surgeon: Algernon Huxley, MD;  Location: [place] [facility];  Service: Cardiovascular;  Laterality: Left;  . LOWER EXTREMITY ANGIOGRAPHY Left 10/30/2017   Procedure: LOWER EXTREMITY ANGIOGRAPHY;  Surgeon: Algernon Huxley, MD;  Location: [place] [facility];  Service: Cardiovascular;  Laterality: Left;  . LOWER EXTREMITY ANGIOGRAPHY Right 03/08/2018   Procedure: LOWER EXTREMITY ANGIOGRAPHY;  Surgeon: Algernon Huxley, MD;  Location: [place] [facility];  Service: Cardiovascular;  Laterality: Right;  . PERIPHERAL VASCULAR INTERVENTION  03/08/2018   Procedure: PERIPHERAL VASCULAR INTERVENTION;  Surgeon: Algernon Huxley, MD;  Location: [place] [facility];  Service: Cardiovascular;;  . REDUCTION MAMMAPLASTY  1997    FAMILY HISTORY Family  History  Problem Relation Age of Onset  . Coronary artery disease Father   . Heart attack Father   . Coronary artery disease Mother   . Heart attack Mother   . Ovarian cancer Sister 52       sister had hormonal therapy for IVF txs-which increased risk factor for ovarian cancer  . Breast cancer Neg Hx     SOCIAL HISTORY Social History   Tobacco Use  . Smoking status: Former Smoker    Packs/day: 1.00    Years: 20.00    Pack years: 20.00    Types: Cigarettes    Last attempt to quit: 03/07/1996    Years since quitting: 22.2  . Smokeless tobacco: Never Used  . Tobacco comment: started smoking at age 50  Substance Use Topics  . Alcohol use: No    Alcohol/week: 0.0 standard drinks  . Drug use: No         OPHTHALMIC EXAM:  Base Eye Exam    Visual Acuity (Snellen - Linear)      Right Left   Dist [place] 20/40 +1 20/25 -2   Dist ph [place] NI 20/20       Tonometry (Tonopen, 2:03 PM)      Right Left   Pressure 11 12       Pupils      Dark Light Shape React APD   Right 3 2 Round Brisk None   Left 3 2 Round Brisk None       Visual Fields (Counting fingers)      Left Right    Full Full       Extraocular Movement      Right Left    Full, Ortho Full, Ortho       Neuro/Psych    Oriented x3:  Yes   Mood/Affect:  Normal       Dilation    Both eyes:  1.0% Mydriacyl, 2.5% Phenylephrine @ 2:03 PM        Slit Lamp and Fundus Exam    External Exam      Right Left   External Normal Normal       Slit  Lamp Exam      Right Left   Lids/Lashes dermatochalasis dermatochalasis   Conjunctiva/Sclera White and quiet White and quiet   Cornea arcus; well healed cataract wound; 1+ Punctate epithelial erosions, decreased TBUT, mild Anterior basement membrane dystrophy superiorly arcus; well healed cataract wound, 1+ Punctate epithelial erosions, irregualr epi surface, decreased TBUT   Anterior Chamber Deep and quiet Deep and quiet   Iris Round and dilated Round and dilated   Lens  PCIOL; open PC PCIOL; open PC   Vitreous syneresis, Posterior vitreous detachment, vitreous condensations inferiorly syneresis, Posterior vitreous detachment       Fundus Exam      Right Left   Disc Superior hyperemia and mild edema, +IRH superior disc - persistent Pink and Sharp   C/D Ratio 0.4 0.5   Macula Blunted foveal reflex, interval improvement in central CME, +Epiretinal membrane flat; no heme or edema, small pigment clump IT to fovea   Vessels Vascular attenuation, Tortuous Vascular attenuation   Periphery attached; 360 MAs/DBH peripherally attached        Refraction    Manifest Refraction      Sphere Cylinder Axis Dist VA   Right +0.75 +1.00 080 20/40   Left              IMAGING AND PROCEDURES  Imaging and Procedures for 04/25/17  OCT, Retina - OU - Both Eyes       Right Eye Quality was good. Central Foveal Thickness: 346. Progression has improved. Findings include abnormal foveal contour, epiretinal membrane, no SRF, no IRF (interval improvement in IRF and foveal contour).   Left Eye Quality was good. Central Foveal Thickness: 289. Progression has been stable. Findings include normal foveal contour, no IRF, no SRF.   Notes *Images captured and stored on drive  Diagnosis / Impression:  OD: +IRF/CME resolved; no SRF -- interval improvement in IRF and foveal contour; +ERM OS: NFP; no IRF/SRF--stable  Clinical management:  See below  Abbreviations: NFP - Normal foveal profile. CME - cystoid macular edema. PED - pigment epithelial detachment. IRF - intraretinal fluid. SRF - subretinal fluid. EZ - ellipsoid zone. ERM - epiretinal membrane. ORA - outer retinal atrophy. ORT - outer retinal tubulation. SRHM - subretinal hyper-reflective material        Intravitreal Injection, Pharmacologic Agent - OD - Right Eye       Time Out 06/12/2018. 3:32 PM. Confirmed correct patient, procedure, site, and patient consented.   Anesthesia Topical anesthesia was used.  Anesthetic medications included Lidocaine 2%, Tetracaine 0.5%.   Procedure Preparation included 5% betadine to ocular surface, eyelid speculum. A supplied needle was used.   Injection:  2 mg aflibercept Gretta Cool) SOLN   NDC: L6038910, Lot: [id number], Expiration date: 11/02/2018   Route: Intravitreal, Site: Right Eye, Waste: 0.05 mL  Post-op Post injection exam found visual acuity of at least counting fingers. The patient tolerated the procedure well. There were no complications. The patient received written and verbal post procedure care education.                 ASSESSMENT/PLAN:    ICD-10-CM   1. Branch retinal vein occlusion of right eye with macular edema H34.8310 Intravitreal Injection, Pharmacologic Agent - OD - Right Eye    aflibercept (EYLEA) SOLN 2 mg  2. Retinal edema H35.81 OCT, Retina - OU - Both Eyes  3. Both eyes affected by mild nonproliferative diabetic retinopathy with macular edema, associated with type 2 diabetes mellitus (HCC) U98.1191  4. Essential hypertension I10   5. Hypertensive retinopathy of both eyes H35.033   6. Epiretinal membrane (ERM) of right eye H35.371   7. Pseudophakia of both eyes Z96.1   8. 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 Floaters in visual field, right H43.391     1,2. BRVO w/ CME OD  - by history, pt states symptoms first noticed 2 wks prior to presentation, but reports changes may have occurred prior  - initial exam with differential tortuosity of vessels (OD > OS)  - FA (02.10.20) shows mild late staining / leakage in macula, staining / leakage of disc -- improving CME  - differential includes DM2 (DME), hypertensive retinopathy, inflammatory etiology / uveitis  - S/P IVA OD #1 (02.08.19), #2 (03.11.19), #3 (04.09.19), #4 (05.20.19), #5 (02.10.20)  - review of OCTs show interval increase in CME OD after initial improvement post IVA #1 --  resistance to IVA   - June 2019 -- switched therapies: S/P IVE OD #1  (06.24.19), #2 (07.24.19), #3 (09.04.19), #4 (10.30.19),#5 (12.30.19), #6 (03.23.20), #7 (05.05.20) - gave IVA OS on 2.10.20 due to pending Eylea4U for 2020 -- resulted in increased IRF/CME - OCT today shows interval resolution of IRF - BCVA improved to 20/50 OD today (up from 20/70 on 03.23.20)  - Eylea4U benefits investigation for 2020 now completed and pt approved for IVE - recommend IVE OD #8 (06.09.20) - RBA of procedure discussed, questions answered - informed consent obtained and signed - see procedure note - F/U 5-6 weeks -- DFE/OCT  3. Mild nonproliferative diabetic retinopathy, both eyes - The incidence, risk factors for progression, natural history and treatment options for diabetic retinopathy were discussed with patient.   - The need for close monitoring of blood glucose, blood pressure, and serum lipids, avoiding cigarette or any type of tobacco, and the need for long term follow up was also discussed with patient. - could be contributing to CME OD - OS with minimal diabetic retinopathy - continue to monitor  4,5. Hypertensive retinopathy OU - stable - as above, may have contributing to CME OD - discussed importance of tight BP control - monitor  6. Epiretinal membrane, right eye  The natural history, anatomy, potential for loss of vision, and treatment options including vitrectomy techniques and the complications of endophthalmitis, retinal detachment, vitreous hemorrhage, cataract progression and permanent vision loss discussed with the patient. - stable nasal ERM - no indication for surgery at this time  7. Pseudophakia OU - [facility] - monitor  8. Optic disc edema OD -- sectoral - likely secondary to BRVO but differential includes carotid stenosis and retro-orbital mass - history of blood clots - recommend CT orbits w/ contrast to r/o retro-orbital mass -- not obtained - recommend carotid dopplers to r/o stenosis /  occlusion -- pt scheduled for repeat u/s due to recent bilateral femoral endarterectomies -- strong history of peripheral vascular disease   Ophthalmic Meds Ordered this visit:  Meds ordered this encounter  Medications  . aflibercept (EYLEA) SOLN 2 mg       Return in about 5 weeks (around 07/17/2018) for f/u CME OD, DFE, OCT.  There are no Patient Instructions on file for this visit.   Explained the diagnoses, plan, and follow up with the patient and they expressed understanding.  Patient expressed understanding of the importance of proper follow up care.   This document serves as a record of services  personally performed by Karie ChimeraBrian G. Manar Smalling, MD, PhD. It was created on their behalf by Laurian BrimAmanda Brown, OA, an ophthalmic assistant. The creation of this record is the provider's dictation and/or activities during the visit.    Electronically signed by: Laurian BrimAmanda Brown, OA  06.08.2020 5:04 PM    Karie ChimeraBrian G. Keyanna Sandefer, M.D., Ph.D. Diseases & Surgery of the Retina and Vitreous [facility]  I have reviewed the above documentation for accuracy and completeness, and I agree with the above. Karie ChimeraBrian G. Honora Searson, M.D., Ph.D. 06/12/18 5:07 PM    Abbreviations: M myopia (nearsighted); A astigmatism; H hyperopia (farsighted); P presbyopia; Mrx spectacle prescription;  CTL contact lenses; OD right eye; OS left eye; OU both eyes  XT exotropia; ET esotropia; PEK punctate epithelial keratitis; PEE punctate epithelial erosions; DES dry eye syndrome; MGD meibomian gland dysfunction; ATs artificial tears; PFAT's preservative free artificial tears; NSC nuclear sclerotic cataract; PSC posterior subcapsular cataract; ERM epi-retinal membrane; PVD posterior vitreous detachment; RD retinal detachment; DM diabetes mellitus; DR diabetic retinopathy; NPDR non-proliferative diabetic retinopathy; PDR proliferative diabetic retinopathy; CSME clinically significant macular edema; DME diabetic macular edema; dbh  dot blot hemorrhages; CWS cotton wool spot; POAG primary open angle glaucoma; C/D cup-to-disc ratio; HVF humphrey visual field; GVF goldmann visual field; OCT optical coherence tomography; IOP intraocular pressure; BRVO Branch retinal vein occlusion; CRVO central retinal vein occlusion; CRAO central retinal artery occlusion; BRAO branch retinal artery occlusion; RT retinal tear; SB scleral buckle; PPV pars plana vitrectomy; VH Vitreous hemorrhage; PRP panretinal laser photocoagulation; IVK intravitreal kenalog; VMT vitreomacular traction; MH Macular hole;  NVD neovascularization of the disc; NVE neovascularization elsewhere; AREDS age related eye disease study; ARMD age related macular degeneration; POAG primary open angle glaucoma; EBMD epithelial/anterior basement membrane dystrophy; ACIOL anterior chamber intraocular lens; IOL intraocular lens; PCIOL posterior chamber intraocular lens; Phaco/IOL phacoemulsification with intraocular lens placement; PRK photorefractive keratectomy; LASIK laser assisted in situ keratomileusis; HTN hypertension; DM diabetes mellitus; COPD chronic obstructive pulmonary disease

## 2018-06-12 ENCOUNTER — Other Ambulatory Visit: Payer: Self-pay

## 2018-06-12 ENCOUNTER — Encounter (INDEPENDENT_AMBULATORY_CARE_PROVIDER_SITE_OTHER): Payer: Self-pay | Admitting: Ophthalmology

## 2018-06-12 ENCOUNTER — Ambulatory Visit (INDEPENDENT_AMBULATORY_CARE_PROVIDER_SITE_OTHER): Payer: Medicare Other | Admitting: Ophthalmology

## 2018-06-12 DIAGNOSIS — H35371 Puckering of macula, right eye: Secondary | ICD-10-CM

## 2018-06-12 DIAGNOSIS — H34831 Tributary (branch) retinal vein occlusion, right eye, with macular edema: Secondary | ICD-10-CM | POA: Diagnosis not present

## 2018-06-12 DIAGNOSIS — I10 Essential (primary) hypertension: Secondary | ICD-10-CM

## 2018-06-12 DIAGNOSIS — H35033 Hypertensive retinopathy, bilateral: Secondary | ICD-10-CM

## 2018-06-12 DIAGNOSIS — H3581 Retinal edema: Secondary | ICD-10-CM

## 2018-06-12 DIAGNOSIS — H4710 Unspecified papilledema: Secondary | ICD-10-CM

## 2018-06-12 DIAGNOSIS — Z961 Presence of intraocular lens: Secondary | ICD-10-CM

## 2018-06-12 DIAGNOSIS — E113213 Type 2 diabetes mellitus with mild nonproliferative diabetic retinopathy with macular edema, bilateral: Secondary | ICD-10-CM

## 2018-06-12 MED ORDER — AFLIBERCEPT 2MG/0.05ML IZ SOLN FOR KALEIDOSCOPE
2.0000 mg | INTRAVITREAL | Status: AC | PRN
  Administered 2018-06-12: 2 mg via INTRAVITREAL

## 2018-06-22 ENCOUNTER — Other Ambulatory Visit: Payer: Self-pay

## 2018-06-22 ENCOUNTER — Ambulatory Visit (INDEPENDENT_AMBULATORY_CARE_PROVIDER_SITE_OTHER): Payer: Medicare Other | Admitting: Vascular Surgery

## 2018-06-22 ENCOUNTER — Ambulatory Visit (INDEPENDENT_AMBULATORY_CARE_PROVIDER_SITE_OTHER): Payer: Medicare Other

## 2018-06-22 ENCOUNTER — Encounter (INDEPENDENT_AMBULATORY_CARE_PROVIDER_SITE_OTHER): Payer: Self-pay | Admitting: Vascular Surgery

## 2018-06-22 VITALS — BP 139/69 | HR 86 | Resp 10 | Ht 65.0 in | Wt 167.0 lb

## 2018-06-22 DIAGNOSIS — L97529 Non-pressure chronic ulcer of other part of left foot with unspecified severity: Secondary | ICD-10-CM | POA: Diagnosis not present

## 2018-06-22 DIAGNOSIS — E785 Hyperlipidemia, unspecified: Secondary | ICD-10-CM | POA: Diagnosis not present

## 2018-06-22 DIAGNOSIS — I7025 Atherosclerosis of native arteries of other extremities with ulceration: Secondary | ICD-10-CM

## 2018-06-22 DIAGNOSIS — Z95828 Presence of other vascular implants and grafts: Secondary | ICD-10-CM

## 2018-06-22 DIAGNOSIS — L97522 Non-pressure chronic ulcer of other part of left foot with fat layer exposed: Secondary | ICD-10-CM

## 2018-06-22 DIAGNOSIS — I10 Essential (primary) hypertension: Secondary | ICD-10-CM

## 2018-06-22 DIAGNOSIS — Z79899 Other long term (current) drug therapy: Secondary | ICD-10-CM

## 2018-06-22 DIAGNOSIS — E1165 Type 2 diabetes mellitus with hyperglycemia: Secondary | ICD-10-CM

## 2018-06-22 DIAGNOSIS — I70245 Atherosclerosis of native arteries of left leg with ulceration of other part of foot: Secondary | ICD-10-CM | POA: Diagnosis not present

## 2018-06-22 DIAGNOSIS — Z87891 Personal history of nicotine dependence: Secondary | ICD-10-CM

## 2018-06-22 DIAGNOSIS — Z7984 Long term (current) use of oral hypoglycemic drugs: Secondary | ICD-10-CM

## 2018-06-22 DIAGNOSIS — Z7901 Long term (current) use of anticoagulants: Secondary | ICD-10-CM

## 2018-06-22 NOTE — Progress Notes (Signed)
MRN : [id number]  Cheryl Leonard is a 73 y.o. (11-22-1945) female who presents with chief complaint of  Chief Complaint  Patient presents with  . Follow-up  .  History of Present Illness: Patient returns today in follow up of her PAD.  She is doing fairly well today.  The ulcers on her left foot has basically healed with the help of excellent care from podiatry.  She has some pain but no disabling claudication, ischemic rest pain, or ulceration at this time.  She has a known recurrent left SFA occlusion below her surgery and an area of previous intervention, and her noninvasive studies today show a right ABI of 1.03 and a left ABI of 0.63 although her digital pressure on the left is 95 which is only mildly reduced.  Current Outpatient Medications  Medication Sig Dispense Refill  . Aflibercept (EYLEA) 2 MG/0.05ML SOLN by Intravitreal route.    [name] aspirin EC 81 MG tablet Take 81 mg by mouth daily.     [name] buPROPion (WELLBUTRIN XL) 300 MG 24 hr tablet Take 1 tablet (300 mg total) by mouth daily.    [name] BYSTOLIC 5 MG tablet Take 5 mg by mouth daily.  2  . cholecalciferol (VITAMIN D) 1000 units tablet Take 1,000 Units by mouth 2 (two) times daily.    . clopidogrel (PLAVIX) 75 MG tablet Take 1 tablet (75 mg total) by mouth daily. 30 tablet 11  . denosumab (PROLIA) 60 MG/ML SOLN injection Inject into the skin.    . DULoxetine (CYMBALTA) 60 MG capsule Take 1 capsule (60 mg total) by mouth 2 (two) times daily.  3  . ferrous sulfate 325 (65 FE) MG tablet Take 1 tablet (325 mg total) by mouth daily with breakfast. 30 tablet 3  . Iron-Vitamin C (VITRON-C) 65-125 MG TABS Take 1 tablet by mouth daily.     [name] levothyroxine (SYNTHROID, LEVOTHROID) 100 MCG tablet Take 100 mcg by mouth daily.  3  . lisinopril (PRINIVIL,ZESTRIL) 10 MG tablet Take 10 mg by mouth daily.     . metFORMIN (GLUCOPHAGE) 1000 MG tablet Take 1,000 mg by mouth 2 (two) times daily.    . pantoprazole (PROTONIX) 40 MG tablet Take 40 mg  by mouth daily.     . ranitidine (ZANTAC) 300 MG tablet Take 300 mg by mouth at bedtime.    . rosuvastatin (CRESTOR) 20 MG tablet Take 20 mg by mouth daily.    . TRESIBA FLEXTOUCH 200 UNIT/ML SOPN INJECT 30 UNITS SUB-Q NIGHTLY  5  . VASCEPA 1 g CAPS Take 1 capsule by mouth 2 (two) times daily.    . vitamin B-12 (CYANOCOBALAMIN) 1000 MCG tablet Take 1,000 mcg by mouth daily.    . vitamin E 400 UNIT capsule Take 400 Units by mouth daily.    [name] zolpidem (AMBIEN) 5 MG tablet Take 1 tablet (5 mg total) by mouth at bedtime. 30 tablet 0  . ALPRAZolam (XANAX) 0.25 MG tablet Take by mouth.    [name] atorvastatin (LIPITOR) 20 MG tablet Take 20 mg by mouth at bedtime.  3  . buPROPion (WELLBUTRIN XL) 150 MG 24 hr tablet Take 150 mg by mouth daily. Take with 300 mg to equal 450 mg once daily    . chlorhexidine (PERIDEX) 0.12 % solution RINSE FOR 30 SECONDS WITH 0.5 OZ TWICE A DAY  0  . CINNAMON PO Take 1 tablet by mouth daily.    . collagenase (SANTYL) ointment Apply 1 application topically daily. (  Patient not taking: Reported on 06/22/2018) 90 g 1  . CORAL CALCIUM PO Take 1 tablet by mouth daily.    . DENTA 5000 PLUS 1.1 % CREA dental cream 2 (two) times daily. as directed  99  . furosemide (LASIX) 20 MG tablet Take by mouth.    [name]. gemfibrozil (LOPID) 600 MG tablet Take 600 mg by mouth 2 (two) times daily.  3  . HYDROcodone-acetaminophen (NORCO/VICODIN) 5-325 MG tablet Take 1-2 tablets by mouth every 6 (six) hours as needed for moderate pain or severe pain. (Patient not taking: Reported on 06/22/2018) 56 tablet 0  . Insulin Pen Needle (B-D UF III MINI PEN NEEDLES) 31G X 5 MM MISC once daily.    . Magnesium 500 MG TABS Take 500 mg by mouth daily.    . Multiple Vitamin (MULTIVITAMIN WITH MINERALS) TABS tablet Take 1 tablet by mouth daily.    . mupirocin ointment (BACTROBAN) 2 % APPLY TO AFFECTED AREA 3 TIMES A DAY  0  . Omega-3 Fatty Acids (FISH OIL) 1000 MG CAPS Take 1,000 mg by mouth daily.    . ranitidine  (ZANTAC) 300 MG capsule Take 300 mg by mouth at bedtime.  1  . sitaGLIPtin-metformin (JANUMET) 50-1000 MG tablet Take by mouth.     Current Facility-Administered Medications  Medication Dose Route Frequency Provider Last Rate Last Dose  . aflibercept (EYLEA) SOLN 2 mg  2 mg Intravitreal  Rennis ChrisZamora, Brian, MD   2 mg at 06/27/17 1307  . aflibercept (EYLEA) SOLN 2 mg  2 mg Intravitreal  Rennis ChrisZamora, Brian, MD   2 mg at 07/26/17 1702  . aflibercept (EYLEA) SOLN 2 mg  2 mg Intravitreal  Rennis ChrisZamora, Brian, MD   2 mg at 09/06/17 1700  . aflibercept (EYLEA) SOLN 2 mg  2 mg Intravitreal  Rennis ChrisZamora, Brian, MD   2 mg at 11/01/17 1550  . aflibercept (EYLEA) SOLN 2 mg  2 mg Intravitreal  Rennis ChrisZamora, Brian, MD   2 mg at 01/03/18 1416  . Bevacizumab (AVASTIN) SOLN 1.25 mg  1.25 mg Intravitreal  Rennis ChrisZamora, Brian, MD   1.25 mg at 02/10/17 0953  . Bevacizumab (AVASTIN) SOLN 1.25 mg  1.25 mg Intravitreal  Rennis ChrisZamora, Brian, MD   1.25 mg at 03/13/17 1717  . Bevacizumab (AVASTIN) SOLN 1.25 mg  1.25 mg Intravitreal  Rennis ChrisZamora, Brian, MD   1.25 mg at 04/11/17 1626  . Bevacizumab (AVASTIN) SOLN 1.25 mg  1.25 mg Intravitreal  Rennis ChrisZamora, Brian, MD   1.25 mg at 02/12/18 2154  . insulin aspart (novoLOG) injection 0-15 Units  0-15 Units Subcutaneous TID WC [name], [name], PA-C      . insulin aspart (novoLOG) injection 0-5 Units  0-5 Units Subcutaneous QHS [name], Ranae PlumberKimberly A, PA-C        Past Medical History:  Diagnosis Date  . Anxiety   . Arthritis   . Cataracts, both eyes   . Diabetic retinopathy (HCC)    NPDR OU  . GERD (gastroesophageal reflux disease)   . Gout   . History of fracture of patella    right knee  . History of positive PPD    Patient always shows positive  . Hyperlipidemia   . Hypertension   . Hypertensive retinopathy    OU  . Hypothyroidism   . Lichen sclerosus 12/30/2013   of vulva  . Metatarsal fracture   . Neuropathy   . Peripheral vascular disease (HCC)   . Polyneuropathy    numbness and tingling in feet  and toes  . Renal insufficiency   . Type 2 diabetes mellitus, uncontrolled (HCC)     Past Surgical History:  Procedure Laterality Date  . APPENDECTOMY    . BREAST REDUCTION SURGERY  2001  . CATARACT EXTRACTION    . CESAREAN SECTION  1976  . COLONOSCOPY  03/05/2013   Nml - due for repeat 03/06/2018  . DILATION AND CURETTAGE OF UTERUS  1989  . ENDARTERECTOMY FEMORAL Bilateral 03/09/2018   Procedure: ENDARTERECTOMY FEMORAL;  Surgeon: Annice Needyew, Johntay Doolen S, MD;  Location: [facility];  Service: Vascular;  Laterality: Bilateral;  . ENDARTERECTOMY POPLITEAL Left 03/09/2018   Procedure: ENDARTERECTOMY POPLITEAL AND SFA;  Surgeon: Annice Needyew, Trysta Showman S, MD;  Location: [facility];  Service: Vascular;  Laterality: Left;  . ESOPHAGOGASTRODUODENOSCOPY  03/05/2013  . EYE SURGERY    . Eyelid Surgery  2012  . INTRAMEDULLARY (IM) NAIL INTERTROCHANTERIC Left 10/30/2015   Procedure: INTRAMEDULLARY (IM) NAIL INTERTROCHANTRIC ;  Surgeon: Kennedy BuckerMichael Menz, MD;  Location: [facility];  Service: Orthopedics;  Laterality: Left;  . LAPAROSCOPIC HYSTERECTOMY  2000   total  . LOWER EXTREMITY ANGIOGRAPHY Left 03/08/2017   Procedure: LOWER EXTREMITY ANGIOGRAPHY;  Surgeon: Annice Needyew, Kelijah Towry S, MD;  Location: [facility];  Service: Cardiovascular;  Laterality: Left;  . LOWER EXTREMITY ANGIOGRAPHY Left 10/30/2017   Procedure: LOWER EXTREMITY ANGIOGRAPHY;  Surgeon: Annice Needyew, Safari Cinque S, MD;  Location: [facility];  Service: Cardiovascular;  Laterality: Left;  . LOWER EXTREMITY ANGIOGRAPHY Right 03/08/2018   Procedure: LOWER EXTREMITY ANGIOGRAPHY;  Surgeon: Annice Needyew, Allicia Culley S, MD;  Location: [facility];  Service: Cardiovascular;  Laterality: Right;  . PERIPHERAL VASCULAR INTERVENTION  03/08/2018   Procedure: PERIPHERAL VASCULAR INTERVENTION;  Surgeon: Annice Needyew, Meerab Maselli S, MD;  Location: [facility];  Service: Cardiovascular;;  . REDUCTION MAMMAPLASTY  1997   Social History        Tobacco Use  . Smoking status: Former Smoker    Packs/day:  1.00    Years: 20.00    Pack years: 20.00    Types: Cigarettes    Last attempt to quit: 03/07/1996    Years since quitting: 21.3  . Smokeless tobacco: Never Used  . Tobacco comment: started smoking at age 73  Substance Use Topics  . Alcohol use: No    Alcohol/week: 0.0 oz  . Drug use: No          Family History  Problem Relation Age of Onset  . Coronary artery disease Father   . Heart attack Father   . Coronary artery disease Mother   . Heart attack Mother   . Ovarian cancer Sister [id number]   sister had hormonal therapy for IVF txs-which increased risk factor for ovarian cancer  . Breast cancer Neg Hx      No Known Allergies   REVIEW OF SYSTEMS(Negative unless checked)  Constitutional: [] ?Weight loss [] ?Fever [] ?Chills Cardiac: [] ?Chest pain [] ?Chest pressure [] ?Palpitations [] ?Shortness of breath when laying flat [] ?Shortness of breath at rest [] ?Shortness of breath with exertion. Vascular: [] ?Pain in legs with walking [] ?Pain in legs at rest [] ?Pain in legs when laying flat [x] ?Claudication [] ?Pain in feet when walking [] ?Pain in feet at rest [] ?Pain in feet when laying flat [] ?History of DVT [] ?Phlebitis [] ?Swelling in legs [] ?Varicose veins [x] ?Non-healing ulcers Pulmonary: [] ?Uses home oxygen [] ?Productive cough [] ?Hemoptysis [] ?Wheeze [] ?COPD [] ?Asthma Neurologic: [] ?Dizziness [] ?Blackouts [] ?Seizures [] ?History of stroke [] ?History of TIA [] ?Aphasia [] ?Temporary blindness [] ?Dysphagia [] ?Weakness or numbness in arms [] ?Weakness or numbness in legs Musculoskeletal: [x] ?Arthritis [] ?Joint swelling [] ?Joint  pain [] ?Low back pain Hematologic: [] ?Easy bruising [] ?Easy bleeding [] ?Hypercoagulable state [] ?Anemic [] ?Hepatitis Gastrointestinal: [] ?Blood in stool [] ?Vomiting blood [] ?Gastroesophageal reflux/heartburn [] ?Difficulty swallowing. Genitourinary: [] ?Chronic  kidney disease [] ?Difficult urination [] ?Frequent urination [] ?Burning with urination [] ?Blood in urine Skin: [] ?Rashes [x] ?Ulcers [x] ?Wounds Psychological: [] ?History of anxiety [] ?History of major depression.    Physical Examination  BP 139/69 (BP Location: Left Arm, Patient Position: Sitting, Cuff Size: Normal)   Pulse 86   Resp 10   Ht 5\' 5"  (1.651 m)   Wt 167 lb (75.8 kg)   BMI 27.79 kg/m  Gen:  WD/WN, NAD Head: [place]/AT, No temporalis wasting. Ear/Nose/Throat: Hearing grossly intact, nares w/o erythema or drainage Eyes: Conjunctiva clear. Sclera non-icteric Neck: Supple.  Trachea midline Pulmonary:  Good air movement, no use of accessory muscles.  Cardiac: RRR, no JVD Vascular:  Vessel Right Left  Radial Palpable Palpable                          PT  2+ palpable  1+ palpable  DP  2+ palpable  1+ palpable    Musculoskeletal: M/S 5/5 throughout.  No deformity or atrophy.  Trace bilateral lower extremity edema. Neurologic: Sensation grossly intact in extremities.  Symmetrical.  Speech is fluent.  Psychiatric: Judgment intact, Mood & affect appropriate for pt's clinical situation. Dermatologic: No rashes or ulcers noted.  No cellulitis or open wounds.       Labs No results found for this or any previous visit (from the past 2160 hour(s)).  Radiology Vas [place] Abi With/wo Tbi  Result Date: 06/22/2018 LOWER EXTREMITY DOPPLER STUDY Indications: Peripheral artery disease.  Vascular Interventions: On 03/08/2017 Aortogram - PTA of the left SFA with                         angioplasty/balloon. Stent placement to the left SFA.                         PTA of the left CIA with angioplasty/balloon.                         10/30/2017 PTA of left SFA and proximal popliteal                         artery. Left SFA and proximal popliteal artery stent. Comparison Study: 03/28/2018 Performing Technologist: Debbe BalesSolomon Mcclary RVS  Examination Guidelines: A complete evaluation  includes at minimum, Doppler waveform signals and systolic blood pressure reading at the level of bilateral brachial, anterior tibial, and posterior tibial arteries, when vessel segments are accessible. Bilateral testing is considered an integral part of a complete examination. Photoelectric Plethysmograph (PPG) waveforms and toe systolic pressure readings are included as required and additional duplex testing as needed. Limited examinations for reoccurring indications may be performed as noted.  ABI Findings: +---------+------------------+-----+---------+--------+ Right    Rt Pressure (mmHg)IndexWaveform Comment  +---------+------------------+-----+---------+--------+ Brachial 142                                      +---------+------------------+-----+---------+--------+ ATA      138               0.97 triphasic         +---------+------------------+-----+---------+--------+ PTA      146  1.03 triphasic         +---------+------------------+-----+---------+--------+ Great Toe130               0.92 Normal            +---------+------------------+-----+---------+--------+ +---------+------------------+-----+----------+-------+ Left     Lt Pressure (mmHg)IndexWaveform  Comment +---------+------------------+-----+----------+-------+ Brachial 141                                      +---------+------------------+-----+----------+-------+ ATA      89                0.63 monophasic        +---------+------------------+-----+----------+-------+ PTA      88                0.62 monophasic        +---------+------------------+-----+----------+-------+ Great Toe95                0.67 Abnormal          +---------+------------------+-----+----------+-------+ +-------+-----------+-----------+------------+------------+ ABI/TBIToday's ABIToday's TBIPrevious ABIPrevious TBI +-------+-----------+-----------+------------+------------+ Right  1.03        .92        1.23        .89          +-------+-----------+-----------+------------+------------+ Left   .63        .67        .56         .81          +-------+-----------+-----------+------------+------------+ Left TBIs appear decreased compared to prior study on 03/28/2018. Compared to prior study on 03/28/2018.  Summary: Right: Resting right ankle-brachial index is within normal range. No evidence of significant right lower extremity arterial disease. The right toe-brachial index is normal. Left: Resting left ankle-brachial index indicates moderate left lower extremity arterial disease. The left toe-brachial index is abnormal.  *See table(s) above for measurements and observations.  Electronically signed by Leotis Pain MD on 06/22/2018 at 12:10:26 PM.    Final     Assessment/Plan Essential hypertension blood pressure control important in reducing the progression of atherosclerotic disease. On appropriate oral medications.   Diabetes mellitus type 2, uncontrolled blood glucose control important in reducing the progression of atherosclerotic disease. Also, involved in wound healing. On appropriate medications.  Hyperlipidemia lipid control important in reducing the progression of atherosclerotic disease. Continue statin therapy   Atherosclerosis of native arteries of the extremities with ulceration ([place]) her noninvasive studies today show a right ABI of 1.03 and a left ABI of 0.63 although her digital pressure on the left is 95 which is only mildly reduced.  Her wounds have healed and her pain is much better.  At this point, no intervention is necessary unless she develops worsening wounds, pain, or new symptoms.  We will continue to follow this somewhat closely and I will plan to see her back in 3 months with noninvasive studies.  She is on 2.5 mg twice daily of Xarelto so she will stop her Plavix which I think is appropriate at this point.    Leotis Pain, MD  06/22/2018 1:01 PM     This note was created with Dragon medical transcription system.  Any errors from dictation are purely unintentional

## 2018-06-22 NOTE — Assessment & Plan Note (Signed)
her noninvasive studies today show a right ABI of 1.03 and a left ABI of 0.63 although her digital pressure on the left is 95 which is only mildly reduced.  Her wounds have healed and her pain is much better.  At this point, no intervention is necessary unless she develops worsening wounds, pain, or new symptoms.  We will continue to follow this somewhat closely and I will plan to see her back in 3 months with noninvasive studies.  She is on 2.5 mg twice daily of Xarelto so she will stop her Plavix which I think is appropriate at this point. 

## 2018-06-22 NOTE — Assessment & Plan Note (Signed)
lipid control important in reducing the progression of atherosclerotic disease. Continue statin therapy  

## 2018-06-22 NOTE — Assessment & Plan Note (Deleted)
her noninvasive studies today show a right ABI of 1.03 and a left ABI of 0.63 although her digital pressure on the left is 95 which is only mildly reduced.  Her wounds have healed and her pain is much better.  At this point, no intervention is necessary unless she develops worsening wounds, pain, or new symptoms.  We will continue to follow this somewhat closely and I will plan to see her back in 3 months with noninvasive studies.  She is on 2.5 mg twice daily of Xarelto so she will stop her Plavix which I think is appropriate at this point.

## 2018-07-03 ENCOUNTER — Other Ambulatory Visit (INDEPENDENT_AMBULATORY_CARE_PROVIDER_SITE_OTHER): Payer: Self-pay | Admitting: Vascular Surgery

## 2018-07-17 ENCOUNTER — Encounter (INDEPENDENT_AMBULATORY_CARE_PROVIDER_SITE_OTHER): Payer: Medicare Other | Admitting: Ophthalmology

## 2018-07-19 NOTE — Progress Notes (Addendum)
[facility] - Clinic Note  07/20/2018     CHIEF COMPLAINT Patient presents for Retina Follow Up   HISTORY OF PRESENT ILLNESS: Cheryl Leonard is a 73 y.o. female who presents to the clinic today for:   HPI    Retina Follow Up    In both eyes.  This started weeks ago.  Severity is moderate.  Since onset it is stable.  I, the attending physician,  performed the HPI with the patient and updated documentation appropriately.          Comments    Patient states vision is the same in both eyes.  She denies eye pain or discomfort and denies any new or worsening floaters or fol.       Last edited by Rennis ChrisZamora, Abigal Choung, MD on 07/20/2018  1:25 PM. (History)    Patient states no change in vision    Referring physician: Danella PentonMiller, Mark F, MD [address] [place] [facility] [place] [id number]  HISTORICAL INFORMATION:   Selected notes from the MEDICAL RECORD NUMBER Referred by Dr. Senaida Oresichardson for concern of DME OD;  LEE- 02.05.19 [BCVA OD: 20/70-1 OS: 20/30-1] Ocular Hx-  PMH- DM Lab Results  Component Value Date   HGBA1C 5.9 (H) 10/29/2015       CURRENT MEDICATIONS: Current Outpatient Medications (Ophthalmic Drugs)  Medication Sig  . Aflibercept (EYLEA) 2 MG/0.05ML SOLN by Intravitreal route.   Current Facility-Administered Medications (Ophthalmic Drugs)  Medication Route  . aflibercept (EYLEA) SOLN 2 mg Intravitreal  . aflibercept (EYLEA) SOLN 2 mg Intravitreal  . aflibercept (EYLEA) SOLN 2 mg Intravitreal  . aflibercept (EYLEA) SOLN 2 mg Intravitreal  . aflibercept (EYLEA) SOLN 2 mg Intravitreal   Current Outpatient Medications (Other)  Medication Sig  . ALPRAZolam (XANAX) 0.25 MG tablet Take by mouth.  [name]. aspirin EC 81 MG tablet Take 81 mg by mouth daily.   [name]. atorvastatin (LIPITOR) 20 MG tablet Take 20 mg by mouth at bedtime.  [name]. buPROPion (WELLBUTRIN XL) 150 MG 24 hr tablet Take 150 mg by mouth daily. Take with 300 mg to  equal 450 mg once daily  . buPROPion (WELLBUTRIN XL) 300 MG 24 hr tablet Take 1 tablet (300 mg total) by mouth daily.  [name]. BYSTOLIC 5 MG tablet Take 5 mg by mouth daily.  . chlorhexidine (PERIDEX) 0.12 % solution RINSE FOR 30 SECONDS WITH 0.5 OZ TWICE A DAY  . cholecalciferol (VITAMIN D) 1000 units tablet Take 1,000 Units by mouth 2 (two) times daily.  [name]. CINNAMON PO Take 1 tablet by mouth daily.  . clopidogrel (PLAVIX) 75 MG tablet Take 1 tablet (75 mg total) by mouth daily.  . collagenase (SANTYL) ointment Apply 1 application topically daily. (Patient not taking: Reported on 06/22/2018)  . CORAL CALCIUM PO Take 1 tablet by mouth daily.  [name]. denosumab (PROLIA) 60 MG/ML SOLN injection Inject into the skin.  . DENTA 5000 PLUS 1.1 % CREA dental cream 2 (two) times daily. as directed  . DULoxetine (CYMBALTA) 60 MG capsule Take 1 capsule (60 mg total) by mouth 2 (two) times daily.  . ferrous sulfate 325 (65 FE) MG tablet TAKE 1 TABLET BY MOUTH DAILY WITH BREAKFAST  . furosemide (LASIX) 20 MG tablet Take by mouth.  [name]. gemfibrozil (LOPID) 600 MG tablet Take 600 mg by mouth 2 (two) times daily.  [name]. HYDROcodone-acetaminophen (NORCO/VICODIN) 5-325 MG tablet Take 1-2 tablets by mouth every 6 (six) hours as needed for moderate  pain or severe pain. (Patient not taking: Reported on 06/22/2018)  . Insulin Pen Needle (B-D UF III MINI PEN NEEDLES) 31G X 5 MM MISC once daily.  . Iron-Vitamin C (VITRON-C) 65-125 MG TABS Take 1 tablet by mouth daily.   [name]. levothyroxine (SYNTHROID, LEVOTHROID) 100 MCG tablet Take 100 mcg by mouth daily.  [name]. lisinopril (PRINIVIL,ZESTRIL) 10 MG tablet Take 10 mg by mouth daily.   . Magnesium 500 MG TABS Take 500 mg by mouth daily.  . metFORMIN (GLUCOPHAGE) 1000 MG tablet Take 1,000 mg by mouth 2 (two) times daily.  . Multiple Vitamin (MULTIVITAMIN WITH MINERALS) TABS tablet Take 1 tablet by mouth daily.  . mupirocin ointment (BACTROBAN) 2 % APPLY TO AFFECTED AREA 3 TIMES A DAY  . Omega-3  Fatty Acids (FISH OIL) 1000 MG CAPS Take 1,000 mg by mouth daily.  . pantoprazole (PROTONIX) 40 MG tablet Take 40 mg by mouth daily.   . ranitidine (ZANTAC) 300 MG capsule Take 300 mg by mouth at bedtime.  . ranitidine (ZANTAC) 300 MG tablet Take 300 mg by mouth at bedtime.  . rosuvastatin (CRESTOR) 20 MG tablet Take 20 mg by mouth daily.  . sitaGLIPtin-metformin (JANUMET) 50-1000 MG tablet Take by mouth.  . TRESIBA FLEXTOUCH 200 UNIT/ML SOPN INJECT 30 UNITS SUB-Q NIGHTLY  . VASCEPA 1 g CAPS Take 1 capsule by mouth 2 (two) times daily.  . vitamin B-12 (CYANOCOBALAMIN) 1000 MCG tablet Take 1,000 mcg by mouth daily.  . vitamin E 400 UNIT capsule Take 400 Units by mouth daily.  [name]. zolpidem (AMBIEN) 5 MG tablet Take 1 tablet (5 mg total) by mouth at bedtime.   Current Facility-Administered Medications (Other)  Medication Route  . Bevacizumab (AVASTIN) SOLN 1.25 mg Intravitreal  . Bevacizumab (AVASTIN) SOLN 1.25 mg Intravitreal  . Bevacizumab (AVASTIN) SOLN 1.25 mg Intravitreal  . Bevacizumab (AVASTIN) SOLN 1.25 mg Intravitreal  . insulin aspart (novoLOG) injection 0-15 Units Subcutaneous  . insulin aspart (novoLOG) injection 0-5 Units Subcutaneous      REVIEW OF SYSTEMS: ROS    Positive for: Endocrine, Cardiovascular, Eyes   Negative for: Constitutional, Gastrointestinal, Neurological, Skin, Genitourinary, Musculoskeletal, HENT, Respiratory, Psychiatric, Allergic/Imm, Heme/Lymph   Last edited by Corrinne EagleEnglish, Ashley L on 07/20/2018  1:06 PM. (History)       ALLERGIES No Known Allergies  PAST MEDICAL HISTORY Past Medical History:  Diagnosis Date  . Anxiety   . Arthritis   . Cataracts, both eyes   . Diabetic retinopathy (HCC)    NPDR OU  . GERD (gastroesophageal reflux disease)   . Gout   . History of fracture of patella    right knee  . History of positive PPD    Patient always shows positive  . Hyperlipidemia   . Hypertension   . Hypertensive retinopathy    OU  .  Hypothyroidism   . Lichen sclerosus 12/30/2013   of vulva  . Metatarsal fracture   . Neuropathy   . Peripheral vascular disease (HCC)   . Polyneuropathy    numbness and tingling in feet and toes  . Renal insufficiency   . Type 2 diabetes mellitus, uncontrolled (HCC)    Past Surgical History:  Procedure Laterality Date  . APPENDECTOMY    . BREAST REDUCTION SURGERY  2001  . CATARACT EXTRACTION    . CESAREAN SECTION  1976  . COLONOSCOPY  03/05/2013   Nml - due for repeat 03/06/2018  . DILATION AND CURETTAGE OF UTERUS  1989  . ENDARTERECTOMY FEMORAL Bilateral  03/09/2018   Procedure: ENDARTERECTOMY FEMORAL;  Surgeon: Annice Needyew, Jason S, MD;  Location: [facility] ORS;  Service: Vascular;  Laterality: Bilateral;  . ENDARTERECTOMY POPLITEAL Left 03/09/2018   Procedure: ENDARTERECTOMY POPLITEAL AND SFA;  Surgeon: Annice Needyew, Jason S, MD;  Location: [facility] ORS;  Service: Vascular;  Laterality: Left;  . ESOPHAGOGASTRODUODENOSCOPY  03/05/2013  . EYE SURGERY    . Eyelid Surgery  2012  . INTRAMEDULLARY (IM) NAIL INTERTROCHANTERIC Left 10/30/2015   Procedure: INTRAMEDULLARY (IM) NAIL INTERTROCHANTRIC ;  Surgeon: Kennedy BuckerMichael Menz, MD;  Location: [facility] ORS;  Service: Orthopedics;  Laterality: Left;  . LAPAROSCOPIC HYSTERECTOMY  2000   total  . LOWER EXTREMITY ANGIOGRAPHY Left 03/08/2017   Procedure: LOWER EXTREMITY ANGIOGRAPHY;  Surgeon: Annice Needyew, Jason S, MD;  Location: [facility];  Service: Cardiovascular;  Laterality: Left;  . LOWER EXTREMITY ANGIOGRAPHY Left 10/30/2017   Procedure: LOWER EXTREMITY ANGIOGRAPHY;  Surgeon: Annice Needyew, Jason S, MD;  Location: [facility];  Service: Cardiovascular;  Laterality: Left;  . LOWER EXTREMITY ANGIOGRAPHY Right 03/08/2018   Procedure: LOWER EXTREMITY ANGIOGRAPHY;  Surgeon: Annice Needyew, Jason S, MD;  Location: [facility];  Service: Cardiovascular;  Laterality: Right;  . PERIPHERAL VASCULAR INTERVENTION  03/08/2018   Procedure: PERIPHERAL VASCULAR INTERVENTION;  Surgeon: Annice Needyew, Jason S,  MD;  Location: [facility];  Service: Cardiovascular;;  . REDUCTION MAMMAPLASTY  1997    FAMILY HISTORY Family History  Problem Relation Age of Onset  . Coronary artery disease Father   . Heart attack Father   . Coronary artery disease Mother   . Heart attack Mother   . Ovarian cancer Sister [id number]       sister had hormonal therapy for IVF txs-which increased risk factor for ovarian cancer  . Breast cancer Neg Hx     SOCIAL HISTORY Social History   Tobacco Use  . Smoking status: Former Smoker    Packs/day: 1.00    Years: 20.00    Pack years: 20.00    Types: Cigarettes    Quit date: 03/07/1996    Years since quitting: 22.3  . Smokeless tobacco: Never Used  . Tobacco comment: started smoking at age 89  Substance Use Topics  . Alcohol use: No    Alcohol/week: 0.0 standard drinks  . Drug use: No         OPHTHALMIC EXAM:  Base Eye Exam    Visual Acuity (Snellen - Linear)      Right Left   Dist [place] 20/40 -2 20/30 +1   Dist ph [place] 20/30 -1        Tonometry (Tonopen, 1:09 PM)      Right Left   Pressure 15 16       Pupils      Dark Light Shape React APD   Right 2 1 Round Brisk 0   Left 2 1 Round Brisk 0       Extraocular Movement      Right Left    Full Full       Neuro/Psych    Oriented x3: Yes   Mood/Affect: Normal       Dilation    Both eyes: 1.0% Mydriacyl, 2.5% Phenylephrine @ 1:10 PM        Slit Lamp and Fundus Exam    External Exam      Right Left   External Normal Normal       Slit Lamp Exam      Right Left   Lids/Lashes dermatochalasis dermatochalasis  Conjunctiva/Sclera White and quiet White and quiet   Cornea arcus; well healed cataract wound; 1+ Punctate epithelial erosions, decreased TBUT, mild Anterior basement membrane dystrophy superiorly arcus; well healed cataract wound, 1+ Punctate epithelial erosions, irregualr epi surface, decreased TBUT   Anterior Chamber Deep and quiet Deep and quiet   Iris Round and dilated Round  and dilated   Lens PCIOL; open PC PCIOL; open PC   Vitreous syneresis, Posterior vitreous detachment, vitreous condensations inferiorly syneresis, Posterior vitreous detachment       Fundus Exam      Right Left   Disc Superior hyperemia and mild edema, +IRH superior disc - persistent Pink and Sharp   C/D Ratio 0.4 0.5   Macula Blunted foveal reflex, interval recurrence in cystic changes/IRF, +Epiretinal membrane flat; no heme or edema, small pigment clump IT to fovea   Vessels Vascular attenuation, Tortuous Vascular attenuation   Periphery attached; 360 MAs/DBH peripherally Attached, no heme          IMAGING AND PROCEDURES  Imaging and Procedures for 04/25/17  OCT, Retina - OU - Both Eyes       Right Eye Quality was good. Central Foveal Thickness: 347. Progression has worsened. Findings include abnormal foveal contour, epiretinal membrane, no SRF, intraretinal fluid (Mild interval recurrence of  IRF and cystic changes, ERM).   Left Eye Quality was good. Central Foveal Thickness: 283. Progression has been stable. Findings include normal foveal contour, no IRF, no SRF (Trace ERM).   Notes *Images captured and stored on drive  Diagnosis / Impression:  OD: +IRF/CME -mild interval recurrence of IRF/cystic changes; no SRF ; +ERM OS: NFP; no IRF/SRF--stable, trace ERM  Clinical management:  See below  Abbreviations: NFP - Normal foveal profile. CME - cystoid macular edema. PED - pigment epithelial detachment. IRF - intraretinal fluid. SRF - subretinal fluid. EZ - ellipsoid zone. ERM - epiretinal membrane. ORA - outer retinal atrophy. ORT - outer retinal tubulation. SRHM - subretinal hyper-reflective material        Intravitreal Injection, Pharmacologic Agent - OD - Right Eye       Time Out 07/20/2018. 1:18 PM. Confirmed correct patient, procedure, site, and patient consented.   Anesthesia Topical anesthesia was used. Anesthetic medications included Lidocaine 2%,  Proparacaine 0.5%.   Procedure Preparation included 5% betadine to ocular surface. A 30 gauge needle was used.   Injection:  2 mg aflibercept Gretta Cool) SOLN   NDC: L6038910, Lot: [id number], Expiration date: 02/23/2019   Route: Intravitreal, Site: Right Eye, Waste: 0.05 mL  Post-op Post injection exam found visual acuity of at least counting fingers. The patient tolerated the procedure well. There were no complications. The patient received written and verbal post procedure care education.                 ASSESSMENT/PLAN:    ICD-10-CM   1. Branch retinal vein occlusion of right eye with macular edema  H34.8310 Intravitreal Injection, Pharmacologic Agent - OD - Right Eye    aflibercept (EYLEA) SOLN 2 mg  2. Retinal edema  H35.81 OCT, Retina - OU - Both Eyes  3. Both eyes affected by mild nonproliferative diabetic retinopathy with macular edema, associated with type 2 diabetes mellitus (HCC)  W29.5621   4. Essential hypertension  I10   5. Hypertensive retinopathy of both eyes  H35.033   6. Epiretinal membrane (ERM) of right eye  H35.371   7. Pseudophakia of both eyes  Z96.1   8. 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 BRVO w/ CME OD  - by history, pt states symptoms first noticed 2 wks prior to presentation, but reports changes may have occurred prior  - initial exam with differential tortuosity of vessels (OD > OS)  - FA (02.10.20) shows mild late staining / leakage in macula, staining / leakage of disc -- improving CME  - differential includes DM2 (DME), hypertensive retinopathy, inflammatory etiology / uveitis  - S/P IVA OD #1 (02.08.19), #2 (03.11.19), #3 (04.09.19), #4 (05.20.19), #5 (02.10.20)  - review of OCTs show interval increase in CME OD after initial improvement post IVA #1 --  resistance to IVA   - June 2019 -- switched therapies: S/P IVE OD #1 (06.24.19), #2 (07.24.19), #3 (09.04.19), #4 (10.30.19),#5 (12.30.19), #6 (03.23.20), #7 (05.05.20), #8  (07.16.20) - gave IVA OS on 2.10.20 due to pending Eylea4U for 2020 -- resulted in increased IRF/CME - OCT today shows mild interval recurrence of IRF and cystic changes OD - BCVA improved to 20/30 OD today (up from 20/40 on 06.09.20)  - Eylea4U benefits investigation for 2020 now completed and pt approved for IVE - recommend IVE OD #9 (07.17.20) - RBA of procedure discussed, questions answered - informed consent obtained and signed - see procedure note - F/U 5-6 weeks -- DFE/OCT  3. Mild nonproliferative diabetic retinopathy, both eyes - The incidence, risk factors for progression, natural history and treatment options for diabetic retinopathy were discussed with patient.   - The need for close monitoring of blood glucose, blood pressure, and serum lipids, avoiding cigarette or any type of tobacco, and the need for long term follow up was also discussed with patient. - could be contributing to CME OD - OS with minimal diabetic retinopathy - continue to monitor  4,5. Hypertensive retinopathy OU - stable - as above, may have contributing to CME OD - discussed importance of tight BP control - monitor  6. Epiretinal membrane, right eye  The natural history, anatomy, potential for loss of vision, and treatment options including vitrectomy techniques and the complications of endophthalmitis, retinal detachment, vitreous hemorrhage, cataract progression and permanent vision loss discussed with the patient. - stable nasal ERM - no indication for surgery at this time  7. Pseudophakia OU - s/p CE/IOL OU by cataract surgeon in [facility] - doing well - monitor  8. Optic disc edema OD -- sectoral - likely secondary to BRVO but differential includes carotid stenosis and retro-orbital mass - history of blood clots - recommend CT orbits w/ contrast to r/o retro-orbital mass -- not obtained - recommend carotid dopplers to r/o stenosis / occlusion -- pt scheduled for repeat u/s due to recent bilateral  femoral endarterectomies -- strong history of peripheral vascular disease   Ophthalmic Meds Ordered this visit:  Meds ordered this encounter  Medications  . aflibercept (EYLEA) SOLN 2 mg       Return 5-6 weeks, for DFE, OCT.  There are no Patient Instructions on file for this visit.   Explained the diagnoses, plan, and follow up with the patient and they expressed understanding.  Patient expressed understanding of the importance of proper follow up care.   This document serves as a record of services personally performed by Gardiner Sleeper, MD, PhD. It was created on their behalf by Ernest Mallick, OA, an ophthalmic assistant. The creation of this record is the provider's dictation and/or activities during the visit.    Electronically signed by: Ernest Mallick, OA 07.16.2020 8:59 PM  Karie Chimera, M.D., Ph.D. Diseases & Surgery of the Retina and Vitreous [facility]  I have reviewed the above documentation for accuracy and completeness, and I agree with the above. Karie Chimera, M.D., Ph.D. 07/20/18 8:59 PM    Abbreviations: M myopia (nearsighted); A astigmatism; H hyperopia (farsighted); P presbyopia; Mrx spectacle prescription;  CTL contact lenses; OD right eye; OS left eye; OU both eyes  XT exotropia; ET esotropia; PEK punctate epithelial keratitis; PEE punctate epithelial erosions; DES dry eye syndrome; MGD meibomian gland dysfunction; ATs artificial tears; PFAT's preservative free artificial tears; NSC nuclear sclerotic cataract; PSC posterior subcapsular cataract; ERM epi-retinal membrane; PVD posterior vitreous detachment; RD retinal detachment; DM diabetes mellitus; DR diabetic retinopathy; NPDR non-proliferative diabetic retinopathy; PDR proliferative diabetic retinopathy; CSME clinically significant macular edema; DME diabetic macular edema; dbh dot blot hemorrhages; CWS cotton wool spot; POAG primary open angle glaucoma; C/D cup-to-disc ratio; HVF  humphrey visual field; GVF goldmann visual field; OCT optical coherence tomography; IOP intraocular pressure; BRVO Branch retinal vein occlusion; CRVO central retinal vein occlusion; CRAO central retinal artery occlusion; BRAO branch retinal artery occlusion; RT retinal tear; SB scleral buckle; PPV pars plana vitrectomy; VH Vitreous hemorrhage; PRP panretinal laser photocoagulation; IVK intravitreal kenalog; VMT vitreomacular traction; MH Macular hole;  NVD neovascularization of the disc; NVE neovascularization elsewhere; AREDS age related eye disease study; ARMD age related macular degeneration; POAG primary open angle glaucoma; EBMD epithelial/anterior basement membrane dystrophy; ACIOL anterior chamber intraocular lens; IOL intraocular lens; PCIOL posterior chamber intraocular lens; Phaco/IOL phacoemulsification with intraocular lens placement; PRK photorefractive keratectomy; LASIK laser assisted in situ keratomileusis; HTN hypertension; DM diabetes mellitus; COPD chronic obstructive pulmonary disease

## 2018-07-20 ENCOUNTER — Other Ambulatory Visit: Payer: Self-pay

## 2018-07-20 ENCOUNTER — Encounter (INDEPENDENT_AMBULATORY_CARE_PROVIDER_SITE_OTHER): Payer: Self-pay | Admitting: Ophthalmology

## 2018-07-20 ENCOUNTER — Ambulatory Visit (INDEPENDENT_AMBULATORY_CARE_PROVIDER_SITE_OTHER): Payer: Medicare Other | Admitting: Ophthalmology

## 2018-07-20 DIAGNOSIS — I10 Essential (primary) hypertension: Secondary | ICD-10-CM | POA: Diagnosis not present

## 2018-07-20 DIAGNOSIS — H35371 Puckering of macula, right eye: Secondary | ICD-10-CM

## 2018-07-20 DIAGNOSIS — H3581 Retinal edema: Secondary | ICD-10-CM

## 2018-07-20 DIAGNOSIS — E113213 Type 2 diabetes mellitus with mild nonproliferative diabetic retinopathy with macular edema, bilateral: Secondary | ICD-10-CM | POA: Diagnosis not present

## 2018-07-20 DIAGNOSIS — H34831 Tributary (branch) retinal vein occlusion, right eye, with macular edema: Secondary | ICD-10-CM | POA: Diagnosis not present

## 2018-07-20 DIAGNOSIS — H4710 Unspecified papilledema: Secondary | ICD-10-CM

## 2018-07-20 DIAGNOSIS — Z961 Presence of intraocular lens: Secondary | ICD-10-CM

## 2018-07-20 DIAGNOSIS — H35033 Hypertensive retinopathy, bilateral: Secondary | ICD-10-CM

## 2018-07-20 MED ORDER — AFLIBERCEPT 2MG/0.05ML IZ SOLN FOR KALEIDOSCOPE
2.0000 mg | INTRAVITREAL | Status: AC | PRN
  Administered 2018-07-20: 2 mg via INTRAVITREAL

## 2018-08-16 ENCOUNTER — Other Ambulatory Visit: Payer: Self-pay | Admitting: Certified Nurse Midwife

## 2018-08-16 DIAGNOSIS — Z1231 Encounter for screening mammogram for malignant neoplasm of breast: Secondary | ICD-10-CM

## 2018-08-22 ENCOUNTER — Encounter (INDEPENDENT_AMBULATORY_CARE_PROVIDER_SITE_OTHER): Payer: Self-pay

## 2018-08-31 ENCOUNTER — Other Ambulatory Visit: Payer: Self-pay

## 2018-08-31 ENCOUNTER — Ambulatory Visit (INDEPENDENT_AMBULATORY_CARE_PROVIDER_SITE_OTHER): Payer: Medicare Other | Admitting: Ophthalmology

## 2018-08-31 ENCOUNTER — Encounter (INDEPENDENT_AMBULATORY_CARE_PROVIDER_SITE_OTHER): Payer: Self-pay | Admitting: Ophthalmology

## 2018-08-31 DIAGNOSIS — E113213 Type 2 diabetes mellitus with mild nonproliferative diabetic retinopathy with macular edema, bilateral: Secondary | ICD-10-CM

## 2018-08-31 DIAGNOSIS — H35371 Puckering of macula, right eye: Secondary | ICD-10-CM

## 2018-08-31 DIAGNOSIS — H43391 Other vitreous opacities, right eye: Secondary | ICD-10-CM

## 2018-08-31 DIAGNOSIS — H34831 Tributary (branch) retinal vein occlusion, right eye, with macular edema: Secondary | ICD-10-CM | POA: Diagnosis not present

## 2018-08-31 DIAGNOSIS — I10 Essential (primary) hypertension: Secondary | ICD-10-CM

## 2018-08-31 DIAGNOSIS — H3581 Retinal edema: Secondary | ICD-10-CM

## 2018-08-31 DIAGNOSIS — H35033 Hypertensive retinopathy, bilateral: Secondary | ICD-10-CM

## 2018-08-31 DIAGNOSIS — H4710 Unspecified papilledema: Secondary | ICD-10-CM

## 2018-08-31 DIAGNOSIS — Z961 Presence of intraocular lens: Secondary | ICD-10-CM

## 2018-08-31 MED ORDER — AFLIBERCEPT 2MG/0.05ML IZ SOLN FOR KALEIDOSCOPE
2.0000 mg | INTRAVITREAL | Status: AC | PRN
  Administered 2018-08-31: 2 mg via INTRAVITREAL

## 2018-08-31 NOTE — Progress Notes (Signed)
[facility] - Clinic Note  08/31/2018     CHIEF COMPLAINT Patient presents for Retina Follow Up   HISTORY OF PRESENT ILLNESS: Cheryl Leonard is a 73 y.o. female who presents to the clinic today for:   HPI    Retina Follow Up    Patient presents with  CRVO/BRVO.  In right eye.  This started 6 weeks ago.  Severity is moderate.  I, the attending physician,  performed the HPI with the patient and updated documentation appropriately.          Comments    Patient here for 6 weeks retina follow up for BRVO with CME. Patient states vision doing the same. No eye pain.        Last edited by Rennis ChrisZamora, Raia Amico, MD on 08/31/2018  3:30 PM. (History)    Patient states no change in vision   Referring physician: Danella PentonMiller, Mark F, MD [address] [facility] [place] [id number]  HISTORICAL INFORMATION:   Selected notes from the MEDICAL RECORD NUMBER Referred by Dr. Senaida Oresichardson for concern of DME OD;  LEE- 02.05.19 [BCVA OD: 20/70-1 OS: 20/30-1] Ocular Hx-  PMH- DM Lab Results  Component Value Date   HGBA1C 5.9 (H) 10/29/2015       CURRENT MEDICATIONS: Current Outpatient Medications (Ophthalmic Drugs)  Medication Sig  . Aflibercept (EYLEA) 2 MG/0.05ML SOLN by Intravitreal route.   Current Facility-Administered Medications (Ophthalmic Drugs)  Medication Route  . aflibercept (EYLEA) SOLN 2 mg Intravitreal  . aflibercept (EYLEA) SOLN 2 mg Intravitreal  . aflibercept (EYLEA) SOLN 2 mg Intravitreal  . aflibercept (EYLEA) SOLN 2 mg Intravitreal  . aflibercept (EYLEA) SOLN 2 mg Intravitreal   Current Outpatient Medications (Other)  Medication Sig  . ALPRAZolam (XANAX) 0.25 MG tablet Take by mouth.  [name]. aspirin EC 81 MG tablet Take 81 mg by mouth daily.   [name]. atorvastatin (LIPITOR) 20 MG tablet Take 20 mg by mouth at bedtime.  [name]. buPROPion (WELLBUTRIN XL) 150 MG 24 hr tablet Take 150 mg by mouth daily. Take with 300 mg to equal 450  mg once daily  . buPROPion (WELLBUTRIN XL) 300 MG 24 hr tablet Take 1 tablet (300 mg total) by mouth daily.  [name]. BYSTOLIC 5 MG tablet Take 5 mg by mouth daily.  . chlorhexidine (PERIDEX) 0.12 % solution RINSE FOR 30 SECONDS WITH 0.5 OZ TWICE A DAY  . cholecalciferol (VITAMIN D) 1000 units tablet Take 1,000 Units by mouth 2 (two) times daily.  [name]. CINNAMON PO Take 1 tablet by mouth daily.  . clopidogrel (PLAVIX) 75 MG tablet Take 1 tablet (75 mg total) by mouth daily.  . collagenase (SANTYL) ointment Apply 1 application topically daily. (Patient not taking: Reported on 06/22/2018)  . CORAL CALCIUM PO Take 1 tablet by mouth daily.  [name]. denosumab (PROLIA) 60 MG/ML SOLN injection Inject into the skin.  . DENTA 5000 PLUS 1.1 % CREA dental cream 2 (two) times daily. as directed  . DULoxetine (CYMBALTA) 60 MG capsule Take 1 capsule (60 mg total) by mouth 2 (two) times daily.  . ferrous sulfate 325 (65 FE) MG tablet TAKE 1 TABLET BY MOUTH DAILY WITH BREAKFAST  . furosemide (LASIX) 20 MG tablet Take by mouth.  [name]. gemfibrozil (LOPID) 600 MG tablet Take 600 mg by mouth 2 (two) times daily.  [name]. HYDROcodone-acetaminophen (NORCO/VICODIN) 5-325 MG tablet Take 1-2 tablets by mouth every 6 (six) hours as needed for moderate pain or severe  pain. (Patient not taking: Reported on 06/22/2018)  . Insulin Pen Needle (B-D UF III MINI PEN NEEDLES) 31G X 5 MM MISC once daily.  . Iron-Vitamin C (VITRON-C) 65-125 MG TABS Take 1 tablet by mouth daily.   [name] levothyroxine (SYNTHROID, LEVOTHROID) 100 MCG tablet Take 100 mcg by mouth daily.  [name] lisinopril (PRINIVIL,ZESTRIL) 10 MG tablet Take 10 mg by mouth daily.   . Magnesium 500 MG TABS Take 500 mg by mouth daily.  . metFORMIN (GLUCOPHAGE) 1000 MG tablet Take 1,000 mg by mouth 2 (two) times daily.  . Multiple Vitamin (MULTIVITAMIN WITH MINERALS) TABS tablet Take 1 tablet by mouth daily.  . mupirocin ointment (BACTROBAN) 2 % APPLY TO AFFECTED AREA 3 TIMES A DAY  . Omega-3 Fatty Acids  (FISH OIL) 1000 MG CAPS Take 1,000 mg by mouth daily.  . pantoprazole (PROTONIX) 40 MG tablet Take 40 mg by mouth daily.   . ranitidine (ZANTAC) 300 MG capsule Take 300 mg by mouth at bedtime.  . ranitidine (ZANTAC) 300 MG tablet Take 300 mg by mouth at bedtime.  . rosuvastatin (CRESTOR) 20 MG tablet Take 20 mg by mouth daily.  . sitaGLIPtin-metformin (JANUMET) 50-1000 MG tablet Take by mouth.  . TRESIBA FLEXTOUCH 200 UNIT/ML SOPN INJECT 30 UNITS SUB-Q NIGHTLY  . VASCEPA 1 g CAPS Take 1 capsule by mouth 2 (two) times daily.  . vitamin B-12 (CYANOCOBALAMIN) 1000 MCG tablet Take 1,000 mcg by mouth daily.  . vitamin E 400 UNIT capsule Take 400 Units by mouth daily.  [name] zolpidem (AMBIEN) 5 MG tablet Take 1 tablet (5 mg total) by mouth at bedtime.   Current Facility-Administered Medications (Other)  Medication Route  . Bevacizumab (AVASTIN) SOLN 1.25 mg Intravitreal  . Bevacizumab (AVASTIN) SOLN 1.25 mg Intravitreal  . Bevacizumab (AVASTIN) SOLN 1.25 mg Intravitreal  . Bevacizumab (AVASTIN) SOLN 1.25 mg Intravitreal  . insulin aspart (novoLOG) injection 0-15 Units Subcutaneous  . insulin aspart (novoLOG) injection 0-5 Units Subcutaneous      REVIEW OF SYSTEMS: ROS    Positive for: Endocrine, Cardiovascular, Eyes   Negative for: Constitutional, Gastrointestinal, Neurological, Skin, Genitourinary, Musculoskeletal, HENT, Respiratory, Psychiatric, Allergic/Imm, Heme/Lymph   Last edited by Laddie Aquas, COA on 08/31/2018  1:48 PM. (History)       ALLERGIES No Known Allergies  PAST MEDICAL HISTORY Past Medical History:  Diagnosis Date  . Anxiety   . Arthritis   . Cataracts, both eyes   . Diabetic retinopathy (HCC)    NPDR OU  . GERD (gastroesophageal reflux disease)   . Gout   . History of fracture of patella    right knee  . History of positive PPD    Patient always shows positive  . Hyperlipidemia   . Hypertension   . Hypertensive retinopathy    OU  . Hypothyroidism    . Lichen sclerosus 12/30/2013   of vulva  . Metatarsal fracture   . Neuropathy   . Peripheral vascular disease (HCC)   . Polyneuropathy    numbness and tingling in feet and toes  . Renal insufficiency   . Type 2 diabetes mellitus, uncontrolled (HCC)    Past Surgical History:  Procedure Laterality Date  . APPENDECTOMY    . BREAST REDUCTION SURGERY  2001  . CATARACT EXTRACTION    . CESAREAN SECTION  1976  . COLONOSCOPY  03/05/2013   Nml - due for repeat 03/06/2018  . DILATION AND CURETTAGE OF UTERUS  1989  . ENDARTERECTOMY FEMORAL Bilateral 03/09/2018  Procedure: ENDARTERECTOMY FEMORAL;  Surgeon: Annice Needy, MD;  Location: [facility] ORS;  Service: Vascular;  Laterality: Bilateral;  . ENDARTERECTOMY POPLITEAL Left 03/09/2018   Procedure: ENDARTERECTOMY POPLITEAL AND SFA;  Surgeon: Annice Needy, MD;  Location: [facility] ORS;  Service: Vascular;  Laterality: Left;  . ESOPHAGOGASTRODUODENOSCOPY  03/05/2013  . EYE SURGERY    . Eyelid Surgery  2012  . INTRAMEDULLARY (IM) NAIL INTERTROCHANTERIC Left 10/30/2015   Procedure: INTRAMEDULLARY (IM) NAIL INTERTROCHANTRIC ;  Surgeon: Kennedy Bucker, MD;  Location: [facility] ORS;  Service: Orthopedics;  Laterality: Left;  . LAPAROSCOPIC HYSTERECTOMY  2000   total  . LOWER EXTREMITY ANGIOGRAPHY Left 03/08/2017   Procedure: LOWER EXTREMITY ANGIOGRAPHY;  Surgeon: Annice Needy, MD;  Location: [facility] INVASIVE CV [facility];  Service: Cardiovascular;  Laterality: Left;  . LOWER EXTREMITY ANGIOGRAPHY Left 10/30/2017   Procedure: LOWER EXTREMITY ANGIOGRAPHY;  Surgeon: Annice Needy, MD;  Location: [facility];  Service: Cardiovascular;  Laterality: Left;  . LOWER EXTREMITY ANGIOGRAPHY Right 03/08/2018   Procedure: LOWER EXTREMITY ANGIOGRAPHY;  Surgeon: Annice Needy, MD;  Location: [facility];  Service: Cardiovascular;  Laterality: Right;  . PERIPHERAL VASCULAR INTERVENTION  03/08/2018   Procedure: PERIPHERAL VASCULAR INTERVENTION;  Surgeon: Annice Needy, MD;  Location:  [facility];  Service: Cardiovascular;;  . REDUCTION MAMMAPLASTY  1997    FAMILY HISTORY Family History  Problem Relation Age of Onset  . Coronary artery disease Father   . Heart attack Father   . Coronary artery disease Mother   . Heart attack Mother   . Ovarian cancer Sister 77       sister had hormonal therapy for IVF txs-which increased risk factor for ovarian cancer  . Breast cancer Neg Hx     SOCIAL HISTORY Social History   Tobacco Use  . Smoking status: Former Smoker    Packs/day: 1.00    Years: 20.00    Pack years: 20.00    Types: Cigarettes    Quit date: 03/07/1996    Years since quitting: 22.4  . Smokeless tobacco: Never Used  . Tobacco comment: started smoking at age 21  Substance Use Topics  . Alcohol use: No    Alcohol/week: 0.0 standard drinks  . Drug use: No         OPHTHALMIC EXAM:  Base Eye Exam    Visual Acuity (Snellen - Linear)      Right Left   Dist [place] 20/40 20/20 -2   Dist ph [place] 20/30 -1        Tonometry (Tonopen, 1:45 PM)      Right Left   Pressure 17 18       Pupils      Dark Light Shape React APD   Right 2 1 Round Brisk None   Left 2 1 Round Brisk None       Visual Fields (Counting fingers)      Left Right    Full Full       Extraocular Movement      Right Left    Full, Ortho Full, Ortho       Neuro/Psych    Oriented x3: Yes   Mood/Affect: Normal       Dilation    Both eyes: 1.0% Mydriacyl, 2.5% Phenylephrine @ 1:45 PM        Slit Lamp and Fundus Exam    External Exam      Right Left   External Normal Normal  Slit Lamp Exam      Right Left   Lids/Lashes dermatochalasis dermatochalasis   Conjunctiva/Sclera White and quiet White and quiet   Cornea arcus; well healed cataract wound; 1+ Punctate epithelial erosions, decreased TBUT, mild Anterior basement membrane dystrophy superiorly arcus; well healed cataract wound, 1+ Punctate epithelial erosions, irregualr epi surface, decreased TBUT    Anterior Chamber Deep and quiet Deep and quiet   Iris Round and dilated Round and dilated   Lens PCIOL; open PC PCIOL; open PC   Vitreous syneresis, Posterior vitreous detachment, vitreous condensations inferiorly syneresis, Posterior vitreous detachment       Fundus Exam      Right Left   Disc Superior hyperemia and mild edema, +IRH superior disc - persistent Pink and Sharp   C/D Ratio 0.4 0.5   Macula Flat, Blunted foveal reflex, trace cystic changes/IRF, +Epiretinal membrane flat; no heme or edema, small pigment clump IT to fovea   Vessels Vascular attenuation, Tortuous Vascular attenuation   Periphery attached; 360 MAs/DBH peripherally Attached, no heme          IMAGING AND PROCEDURES  Imaging and Procedures for 04/25/17  OCT, Retina - OU - Both Eyes       Right Eye Quality was good. Central Foveal Thickness: 363. Progression has been stable. Findings include abnormal foveal contour, epiretinal membrane, no SRF, intraretinal fluid (Persistent, Slightly improved IRF and cystic changes, ERM).   Left Eye Quality was good. Central Foveal Thickness: 292. Progression has been stable. Findings include normal foveal contour, no IRF, no SRF (Trace ERM).   Notes *Images captured and stored on drive  Diagnosis / Impression:  OD: persistent, slightly improved IRF and cystic changes, ERM OS: NFP; no IRF/SRF--stable, trace ERM  Clinical management:  See below  Abbreviations: NFP - Normal foveal profile. CME - cystoid macular edema. PED - pigment epithelial detachment. IRF - intraretinal fluid. SRF - subretinal fluid. EZ - ellipsoid zone. ERM - epiretinal membrane. ORA - outer retinal atrophy. ORT - outer retinal tubulation. SRHM - subretinal hyper-reflective material        Intravitreal Injection, Pharmacologic Agent - OD - Right Eye       Time Out 08/31/2018. 1:28 PM. Confirmed correct patient, procedure, site, and patient consented.   Anesthesia Topical anesthesia was  used. Anesthetic medications included Lidocaine 2%, Proparacaine 0.5%.   Procedure Preparation included 5% betadine to ocular surface, eyelid speculum. A 30 gauge needle was used.   Injection:  2 mg aflibercept Alfonse Flavors) SOLN   NDC: A3590391, Lot: [id number], Expiration date: 03/23/2019   Route: Intravitreal, Site: Right Eye, Waste: 0.05 mL  Post-op Post injection exam found visual acuity of at least counting fingers. The patient tolerated the procedure well. There were no complications. The patient received written and verbal post procedure care education.                 ASSESSMENT/PLAN:    ICD-10-CM   1. Branch retinal vein occlusion of right eye with macular edema  H34.8310 Intravitreal Injection, Pharmacologic Agent - OD - Right Eye    aflibercept (EYLEA) SOLN 2 mg  2. Retinal edema  H35.81 OCT, Retina - OU - Both Eyes  3. Both eyes affected by mild nonproliferative diabetic retinopathy with macular edema, associated with type 2 diabetes mellitus ([place])  E26.8341   4. Essential hypertension  I10   5. Hypertensive retinopathy of both eyes  H35.033   6. Epiretinal membrane (ERM) of right eye  H35.371   7.  Pseudophakia of both eyes  Z96.1   8. 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 Floaters in visual field, right  H43.391     1,2. BRVO w/ CME OD  - by history, pt states symptoms first noticed 2 wks prior to presentation, but reports changes may have occurred prior  - initial exam with differential tortuosity of vessels (OD > OS)  - FA (02.10.20) shows mild late staining / leakage in macula, staining / leakage of disc -- improving CME  - differential includes DM2 (DME), hypertensive retinopathy, inflammatory etiology / uveitis  - S/P IVA OD #1 (02.08.19), #2 (03.11.19), #3 (04.09.19), #4 (05.20.19), #5 (02.10.20)  - review of OCTs show persistent IRF and cystic changes --  resistance to IVA   - June 2019 -- switched therapies: S/P IVE OD #1 (06.24.19), #2  (07.24.19), #3 (09.04.19), #4 (10.30.19),#5 (12.30.19), #6 (03.23.20), #7 (05.05.20), #8 (07.16.20), #9 (07.17.20)  - gave IVA OS on 2.10.20 due to pending Eylea4U for 2020 -- resulted in increased IRF/CME  - OCT today shows persistent, slightly improved IRF and cystic changes OD  - BCVA 20/30 OD today stable  - Eylea4U benefits investigation for 2020 now completed and pt approved for IVE  - recommend IVE OD #10 (08.28.20)  - RBA of procedure discussed, questions answered  - informed consent obtained and signed  - see procedure note  - F/U 6 weeks -- DFE/OCT/possible injection  3. Mild nonproliferative diabetic retinopathy, both eyes  - The incidence, risk factors for progression, natural history and treatment options for diabetic retinopathy were discussed with patient.    - The need for close monitoring of blood glucose, blood pressure, and serum lipids, avoiding cigarette or any type of tobacco, and the need for long term follow up was also discussed with patient.  - could be contributing to CME OD  - OS with minimal diabetic retinopathy  - continue to monitor  4,5. Hypertensive retinopathy OU - stable - as above, may have contributing to CME OD - discussed importance of tight BP control - monitor  6. Epiretinal membrane, right eye   - The natural history, anatomy, potential for loss of vision, and treatment options including vitrectomy techniques and the complications of endophthalmitis, retinal detachment, vitreous hemorrhage, cataract progression and permanent vision loss discussed with the patient.  - stable nasal ERM  - no indication for surgery at this time  7. Pseudophakia OU  - s/p CE/IOL OU by cataract surgeon in [facility]  - doing well  - monitor  8. Optic disc edema OD -- sectoral  - likely secondary to BRVO but differential includes carotid stenosis and retro-orbital mass  - history of blood clots  - recommend CT orbits w/ contrast to r/o retro-orbital mass -- not  obtained  - recommend carotid dopplers to r/o stenosis / occlusion -- pt scheduled for repeat u/s due to recent bilateral femoral endarterectomies -- strong history of peripheral vascular disease   Ophthalmic Meds Ordered this visit:  Meds ordered this encounter  Medications  . aflibercept (EYLEA) SOLN 2 mg       Return in about 6 weeks (around 10/12/2018) for f/u CME OD, OCT, DFE.  There are no Patient Instructions on file for this visit.   Explained the diagnoses, plan, and follow up with the patient and they expressed understanding.  Patient expressed understanding of the importance of proper follow up care.   This document serves as a record of services personally performed  by Karie ChimeraBrian G. Armeda Plumb, MD, PhD. It was created on their behalf by Laurian BrimAmanda Brown, OA, an ophthalmic assistant. The creation of this record is the provider's dictation and/or activities during the visit.    Electronically signed by: Laurian BrimAmanda Brown, OA  08.28.2020 3:39 PM    Karie ChimeraBrian G. Arial Galligan, M.D., Ph.D. Diseases & Surgery of the Retina and Vitreous [facility]  I have reviewed the above documentation for accuracy and completeness, and I agree with the above. Karie ChimeraBrian G. Kam Kushnir, M.D., Ph.D. 08/31/18 3:39 PM     Abbreviations: M myopia (nearsighted); A astigmatism; H hyperopia (farsighted); P presbyopia; Mrx spectacle prescription;  CTL contact lenses; OD right eye; OS left eye; OU both eyes  XT exotropia; ET esotropia; PEK punctate epithelial keratitis; PEE punctate epithelial erosions; DES dry eye syndrome; MGD meibomian gland dysfunction; ATs artificial tears; PFAT's preservative free artificial tears; NSC nuclear sclerotic cataract; PSC posterior subcapsular cataract; ERM epi-retinal membrane; PVD posterior vitreous detachment; RD retinal detachment; DM diabetes mellitus; DR diabetic retinopathy; NPDR non-proliferative diabetic retinopathy; PDR proliferative diabetic retinopathy; CSME clinically  significant macular edema; DME diabetic macular edema; dbh dot blot hemorrhages; CWS cotton wool spot; POAG primary open angle glaucoma; C/D cup-to-disc ratio; HVF humphrey visual field; GVF goldmann visual field; OCT optical coherence tomography; IOP intraocular pressure; BRVO Branch retinal vein occlusion; CRVO central retinal vein occlusion; CRAO central retinal artery occlusion; BRAO branch retinal artery occlusion; RT retinal tear; SB scleral buckle; PPV pars plana vitrectomy; VH Vitreous hemorrhage; PRP panretinal laser photocoagulation; IVK intravitreal kenalog; VMT vitreomacular traction; MH Macular hole;  NVD neovascularization of the disc; NVE neovascularization elsewhere; AREDS age related eye disease study; ARMD age related macular degeneration; POAG primary open angle glaucoma; EBMD epithelial/anterior basement membrane dystrophy; ACIOL anterior chamber intraocular lens; IOL intraocular lens; PCIOL posterior chamber intraocular lens; Phaco/IOL phacoemulsification with intraocular lens placement; PRK photorefractive keratectomy; LASIK laser assisted in situ keratomileusis; HTN hypertension; DM diabetes mellitus; COPD chronic obstructive pulmonary disease

## 2018-09-25 ENCOUNTER — Other Ambulatory Visit: Payer: Self-pay

## 2018-09-25 ENCOUNTER — Other Ambulatory Visit (INDEPENDENT_AMBULATORY_CARE_PROVIDER_SITE_OTHER): Payer: Self-pay | Admitting: Vascular Surgery

## 2018-09-25 ENCOUNTER — Encounter (INDEPENDENT_AMBULATORY_CARE_PROVIDER_SITE_OTHER): Payer: Self-pay | Admitting: Vascular Surgery

## 2018-09-25 ENCOUNTER — Ambulatory Visit (INDEPENDENT_AMBULATORY_CARE_PROVIDER_SITE_OTHER): Payer: Medicare Other

## 2018-09-25 ENCOUNTER — Ambulatory Visit (INDEPENDENT_AMBULATORY_CARE_PROVIDER_SITE_OTHER): Payer: Medicare Other | Admitting: Vascular Surgery

## 2018-09-25 VITALS — BP 126/76 | HR 85 | Resp 12 | Ht 65.0 in | Wt 170.0 lb

## 2018-09-25 DIAGNOSIS — L97522 Non-pressure chronic ulcer of other part of left foot with fat layer exposed: Secondary | ICD-10-CM | POA: Diagnosis not present

## 2018-09-25 DIAGNOSIS — I739 Peripheral vascular disease, unspecified: Secondary | ICD-10-CM

## 2018-09-25 DIAGNOSIS — E1165 Type 2 diabetes mellitus with hyperglycemia: Secondary | ICD-10-CM

## 2018-09-25 DIAGNOSIS — I70223 Atherosclerosis of native arteries of extremities with rest pain, bilateral legs: Secondary | ICD-10-CM | POA: Diagnosis not present

## 2018-09-25 DIAGNOSIS — I10 Essential (primary) hypertension: Secondary | ICD-10-CM

## 2018-09-25 DIAGNOSIS — I70229 Atherosclerosis of native arteries of extremities with rest pain, unspecified extremity: Secondary | ICD-10-CM | POA: Insufficient documentation

## 2018-09-25 DIAGNOSIS — E785 Hyperlipidemia, unspecified: Secondary | ICD-10-CM | POA: Diagnosis not present

## 2018-09-25 NOTE — Progress Notes (Signed)
MRN : [id number]  Cheryl Leonard is a 73 y.o. (1945/06/11) female who presents with chief complaint of  Chief Complaint  Patient presents with   Follow-up  .  History of Present Illness: Patient returns today in follow up of her peripheral arterial disease.  Her left foot ulcer finally healed, but she is now developing more numbness and neuropathic pain up to the calf on the left whereas previously this was in the toes only.  She is having some of the same symptoms on the right as well.  No new ulcerations or infection. Her noninvasive studies today show a marked decrease in her perfusion on both sides.  Previously, her right leg was in the normal range with normal digital pressures.  Her left leg was reduced but the digital pressures were still near normal.  Today, her ABIs have dropped to 0.54 on the right and 0.57 on the left.  Duplex shows occlusion of the SFA stents bilaterally as well as what appears to be an occlusion of the left distal common femoral artery and stenosis of the right common femoral artery.  There is the appearance of thrombus in the left common femoral artery.  Her digital pressures are markedly reduced as well now today with the right digit pressure being 7 and the left digit pressure being 15.  Current Outpatient Medications  Medication Sig Dispense Refill   Aflibercept (EYLEA) 2 MG/0.05ML SOLN by Intravitreal route.     ALPRAZolam (XANAX) 0.25 MG tablet Take by mouth.     aspirin EC 81 MG tablet Take 81 mg by mouth daily.      atorvastatin (LIPITOR) 20 MG tablet Take 20 mg by mouth at bedtime.  3   buPROPion (WELLBUTRIN XL) 150 MG 24 hr tablet Take 150 mg by mouth daily. Take with 300 mg to equal 450 mg once daily     buPROPion (WELLBUTRIN XL) 300 MG 24 hr tablet Take 1 tablet (300 mg total) by mouth daily.     BYSTOLIC 5 MG tablet Take 5 mg by mouth daily.  2   chlorhexidine (PERIDEX) 0.12 % solution RINSE FOR 30 SECONDS WITH 0.5 OZ TWICE A DAY  0    cholecalciferol (VITAMIN D) 1000 units tablet Take 1,000 Units by mouth 2 (two) times daily.     CINNAMON PO Take 1 tablet by mouth daily.     clopidogrel (PLAVIX) 75 MG tablet Take 1 tablet (75 mg total) by mouth daily. 30 tablet 11   collagenase (SANTYL) ointment Apply 1 application topically daily. 90 g 1   CORAL CALCIUM PO Take 1 tablet by mouth daily.     denosumab (PROLIA) 60 MG/ML SOLN injection Inject into the skin.     DENTA 5000 PLUS 1.1 % CREA dental cream 2 (two) times daily. as directed  99   DULoxetine (CYMBALTA) 60 MG capsule Take 1 capsule (60 mg total) by mouth 2 (two) times daily.  3   ferrous sulfate 325 (65 FE) MG tablet TAKE 1 TABLET BY MOUTH DAILY WITH BREAKFAST 30 tablet 3   furosemide (LASIX) 20 MG tablet Take by mouth.     gemfibrozil (LOPID) 600 MG tablet Take 600 mg by mouth 2 (two) times daily.  3   HYDROcodone-acetaminophen (NORCO/VICODIN) 5-325 MG tablet Take 1-2 tablets by mouth every 6 (six) hours as needed for moderate pain or severe pain. 56 tablet 0   Insulin Pen Needle (B-D UF III MINI PEN NEEDLES) 31G X 5 MM MISC  once daily.     Iron-Vitamin C (VITRON-C) 65-125 MG TABS Take 1 tablet by mouth daily.      levothyroxine (SYNTHROID, LEVOTHROID) 100 MCG tablet Take 100 mcg by mouth daily.  3   lisinopril (PRINIVIL,ZESTRIL) 10 MG tablet Take 10 mg by mouth daily.      Magnesium 500 MG TABS Take 500 mg by mouth daily.     metFORMIN (GLUCOPHAGE) 1000 MG tablet Take 1,000 mg by mouth 2 (two) times daily.     Multiple Vitamin (MULTIVITAMIN WITH MINERALS) TABS tablet Take 1 tablet by mouth daily.     mupirocin ointment (BACTROBAN) 2 % APPLY TO AFFECTED AREA 3 TIMES A DAY  0   Omega-3 Fatty Acids (FISH OIL) 1000 MG CAPS Take 1,000 mg by mouth daily.     pantoprazole (PROTONIX) 40 MG tablet Take 40 mg by mouth daily.      ranitidine (ZANTAC) 300 MG capsule Take 300 mg by mouth at bedtime.  1   ranitidine (ZANTAC) 300 MG tablet Take 300 mg by mouth  at bedtime.     rosuvastatin (CRESTOR) 20 MG tablet Take 20 mg by mouth daily.     sitaGLIPtin-metformin (JANUMET) 50-1000 MG tablet Take by mouth.     TRESIBA FLEXTOUCH 200 UNIT/ML SOPN INJECT 30 UNITS SUB-Q NIGHTLY  5   VASCEPA 1 g CAPS Take 1 capsule by mouth 2 (two) times daily.     vitamin B-12 (CYANOCOBALAMIN) 1000 MCG tablet Take 1,000 mcg by mouth daily.     vitamin E 400 UNIT capsule Take 400 Units by mouth daily.     zolpidem (AMBIEN) 5 MG tablet Take 1 tablet (5 mg total) by mouth at bedtime. 30 tablet 0   Current Facility-Administered Medications  Medication Dose Route Frequency Provider Last Rate Last Dose   aflibercept (EYLEA) SOLN 2 mg  2 mg Intravitreal  [name], MD   2 mg at 06/27/17 1307   aflibercept (EYLEA) SOLN 2 mg  2 mg Intravitreal  [name], MD   2 mg at 07/26/17 1702   aflibercept (EYLEA) SOLN 2 mg  2 mg Intravitreal  [name], MD   2 mg at 09/06/17 1700   aflibercept (EYLEA) SOLN 2 mg  2 mg Intravitreal  [name], MD   2 mg at 11/01/17 1550   aflibercept (EYLEA) SOLN 2 mg  2 mg Intravitreal  [name], MD   2 mg at 01/03/18 1416   Bevacizumab (AVASTIN) SOLN 1.25 mg  1.25 mg Intravitreal  [name], MD   1.25 mg at 02/10/17 7341   Bevacizumab (AVASTIN) SOLN 1.25 mg  1.25 mg Intravitreal  [name], MD   1.25 mg at 03/13/17 1717   Bevacizumab (AVASTIN) SOLN 1.25 mg  1.25 mg Intravitreal  [name], MD   1.25 mg at 04/11/17 1626   Bevacizumab (AVASTIN) SOLN 1.25 mg  1.25 mg Intravitreal  [name], MD   1.25 mg at 02/12/18 2154   insulin aspart (novoLOG) injection 0-15 Units  0-15 Units Subcutaneous TID WC [name], [name], PA-C       insulin aspart (novoLOG) injection 0-5 Units  0-5 Units Subcutaneous QHS [name], Ranae Plumber, PA-C        Past Medical History:  Diagnosis Date   Anxiety    Arthritis    Cataracts, both eyes    Diabetic retinopathy (HCC)    NPDR OU   GERD (gastroesophageal  reflux disease)    Gout    History of  fracture of patella    right knee   History of positive PPD    Patient always shows positive   Hyperlipidemia    Hypertension    Hypertensive retinopathy    OU   Hypothyroidism    Lichen sclerosus 74/25/9563   of vulva   Metatarsal fracture    Neuropathy    Peripheral vascular disease (HCC)    Polyneuropathy    numbness and tingling in feet and toes   Renal insufficiency    Type 2 diabetes mellitus, uncontrolled ([place])     Past Surgical History:  Procedure Laterality Date   APPENDECTOMY     BREAST REDUCTION SURGERY  2001   CATARACT EXTRACTION     CESAREAN SECTION  1976   COLONOSCOPY  03/05/2013   Nml - due for repeat 03/06/2018   DILATION AND CURETTAGE OF UTERUS  1989   ENDARTERECTOMY FEMORAL Bilateral 03/09/2018   Procedure: ENDARTERECTOMY FEMORAL;  Surgeon: Algernon Huxley, MD;  Location: [facility];  Service: Vascular;  Laterality: Bilateral;   ENDARTERECTOMY POPLITEAL Left 03/09/2018   Procedure: ENDARTERECTOMY POPLITEAL AND SFA;  Surgeon: Algernon Huxley, MD;  Location: [facility];  Service: Vascular;  Laterality: Left;   ESOPHAGOGASTRODUODENOSCOPY  03/05/2013   EYE SURGERY     Eyelid Surgery  2012   INTRAMEDULLARY (IM) NAIL INTERTROCHANTERIC Left 10/30/2015   Procedure: INTRAMEDULLARY (IM) NAIL INTERTROCHANTRIC ;  Surgeon: Hessie Knows, MD;  Location: [facility];  Service: Orthopedics;  Laterality: Left;   LAPAROSCOPIC HYSTERECTOMY  2000   total   LOWER EXTREMITY ANGIOGRAPHY Left 03/08/2017   Procedure: LOWER EXTREMITY ANGIOGRAPHY;  Surgeon: Algernon Huxley, MD;  Location: [facility];  Service: Cardiovascular;  Laterality: Left;   LOWER EXTREMITY ANGIOGRAPHY Left 10/30/2017   Procedure: LOWER EXTREMITY ANGIOGRAPHY;  Surgeon: Algernon Huxley, MD;  Location: [facility];  Service: Cardiovascular;  Laterality: Left;   LOWER EXTREMITY ANGIOGRAPHY Right 03/08/2018   Procedure: LOWER EXTREMITY ANGIOGRAPHY;  Surgeon:  Algernon Huxley, MD;  Location: [facility];  Service: Cardiovascular;  Laterality: Right;   PERIPHERAL VASCULAR INTERVENTION  03/08/2018   Procedure: PERIPHERAL VASCULAR INTERVENTION;  Surgeon: Algernon Huxley, MD;  Location: [facility];  Service: Cardiovascular;;   REDUCTION MAMMAPLASTY  1997         Tobacco Use   Smoking status: Former Smoker    Packs/day: 1.00    Years: 20.00    Pack years: 20.00    Types: Cigarettes    Last attempt to quit: 03/07/1996    Years since quitting: 21.3   Smokeless tobacco: Never Used   Tobacco comment: started smoking at age 43  Substance Use Topics   Alcohol use: No    Alcohol/week: 0.0 oz   Drug use: No          Family History  Problem Relation Age of Onset   Coronary artery disease Father    Heart attack Father    Coronary artery disease Mother    Heart attack Mother    Ovarian cancer Sister 85   sister had hormonal therapy for IVF txs-which increased risk factor for ovarian cancer   Breast cancer Neg Hx      No Known Allergies   REVIEW OF SYSTEMS(Negative unless checked)  Constitutional: [] ??Weight loss [] ??Fever [] ??Chills Cardiac: [] ??Chest pain [] ??Chest pressure [] ??Palpitations [] ??Shortness of breath when laying flat [] ??Shortness of breath at rest [] ??Shortness of breath with exertion. Vascular: [] ??Pain in legs with walking [] ??Pain in legs at  rest [] ??Pain in legs when laying flat [x] ??Claudication [] ??Pain in feet when walking [] ??Pain in feet at rest [] ??Pain in feet when laying flat [] ??History of DVT [] ??Phlebitis [] ??Swelling in legs [] ??Varicose veins [x] ??Non-healing ulcers Pulmonary: [] ??Uses home oxygen [] ??Productive cough [] ??Hemoptysis [] ??Wheeze [] ??COPD [] ??Asthma Neurologic: [] ??Dizziness [] ??Blackouts [] ??Seizures [] ??History of stroke [] ??History of TIA [] ??Aphasia [] ??Temporary blindness  [] ??Dysphagia [] ??Weakness or numbness in arms [] ??Weakness or numbness in legs Musculoskeletal: [x] ??Arthritis [] ??Joint swelling [] ??Joint pain [] ??Low back pain Hematologic: [] ??Easy bruising [] ??Easy bleeding [] ??Hypercoagulable state [] ??Anemic [] ??Hepatitis Gastrointestinal: [] ??Blood in stool [] ??Vomiting blood [] ??Gastroesophageal reflux/heartburn [] ??Difficulty swallowing. Genitourinary: [] ??Chronic kidney disease [] ??Difficult urination [] ??Frequent urination [] ??Burning with urination [] ??Blood in urine Skin: [] ??Rashes [x] ??Ulcers [x] ??Wounds Psychological: [] ??History of anxiety [] ??History of major depression.     Physical Examination  BP 126/76 (BP Location: Left Arm, Patient Position: Sitting, Cuff Size: Normal)    Pulse 85    Resp 12    Ht 5\' 5"  (1.651 m)    Wt 170 lb (77.1 kg)    BMI 28.29 kg/m  Gen:  WD/WN, NAD Head: [place]/AT, No temporalis wasting. Ear/Nose/Throat: Hearing grossly intact, nares w/o erythema or drainage Eyes: Conjunctiva clear. Sclera non-icteric Neck: Supple.  Trachea midline Pulmonary:  Good air movement, no use of accessory muscles.  Cardiac: RRR, no JVD Vascular:  Vessel Right Left  Radial Palpable Palpable                          PT  not palpable  not palpable  DP  not palpable  not palpable   Gastrointestinal: soft, non-tender/non-distended.  Musculoskeletal: M/S 5/5 throughout.  No deformity or atrophy.  No significant lower extremity edema. Neurologic: Sensation diminished distally in the lower extremities.  Symmetrical.  Speech is fluent.  Psychiatric: Judgment intact, Mood & affect appropriate for pt's clinical situation. Dermatologic: No rashes or ulcers noted.  No cellulitis or open wounds.       Labs No results found for this or any previous visit (from the past 2160 hour(s)).  Radiology No results found.  Assessment/Plan Essential hypertension blood pressure control  important in reducing the progression of atherosclerotic disease. On appropriate oral medications.   Diabetes mellitus type 2, uncontrolled blood glucose control important in reducing the progression of atherosclerotic disease. Also, involved in wound healing. On appropriate medications.  Hyperlipidemia lipid control important in reducing the progression of atherosclerotic disease. Continue statin therapy  Atherosclerosis of native arteries of extremity with rest pain (HCC) Today, her ABIs have dropped to 0.54 on the right and 0.57 on the left.  Duplex shows occlusion of the SFA stents bilaterally as well as what appears to be an occlusion of the left distal common femoral artery and stenosis of the right common femoral artery.  There is the appearance of thrombus in the left common femoral artery.  Her digital pressures are almost flat and only 7 on the right and 15 on the left which is a marked reduction from where they were previously. This is a very difficult and disheartening situation.  In only 3 months, her perfusion has gone from nearly normal to dramatically reduced.  She has limb threatening ischemia in both legs with critically reduced digital pressures and symptoms concerning for rest pain.  We will need to proceed with angiograms likely of both lower extremities in the near future.  This is complicated by her relatively recent femoral incisions and endarterectomies that appear to have some degree of restenosis or occlusion.  All of this  does not bode well for limb salvage but certainly given her good functional status all attempts will be made at limb salvage if possible.  I have discussed the difficult nature of the situation with the patient in detail today and she voices her understanding.    Festus BarrenJason Ernestyne Caldwell, MD  09/25/2018 4:44 PM    This note was created with Dragon medical transcription system.  Any errors from dictation are purely unintentional

## 2018-09-25 NOTE — Assessment & Plan Note (Signed)
Today, her ABIs have dropped to 0.54 on the right and 0.57 on the left.  Duplex shows occlusion of the SFA stents bilaterally as well as what appears to be an occlusion of the left distal common femoral artery and stenosis of the right common femoral artery.  There is the appearance of thrombus in the left common femoral artery.  Her digital pressures are almost flat and only 7 on the right and 15 on the left which is a marked reduction from where they were previously. This is a very difficult and disheartening situation.  In only 3 months, her perfusion has gone from nearly normal to dramatically reduced.  She has limb threatening ischemia in both legs with critically reduced digital pressures and symptoms concerning for rest pain.  We will need to proceed with angiograms likely of both lower extremities in the near future.  This is complicated by her relatively recent femoral incisions and endarterectomies that appear to have some degree of restenosis or occlusion.  All of this does not bode well for limb salvage but certainly given her good functional status all attempts will be made at limb salvage if possible.  I have discussed the difficult nature of the situation with the patient in detail today and she voices her understanding.

## 2018-09-25 NOTE — Patient Instructions (Signed)
Peripheral Vascular Disease  Peripheral vascular disease (PVD) is a disease of the blood vessels that are not part of your heart and brain. A simple term for PVD is poor circulation. In most cases, PVD narrows the blood vessels that carry blood from your heart to the rest of your body. This can reduce the supply of blood to your arms, legs, and internal organs, like your stomach or kidneys. However, PVD most often affects a person's lower legs and feet. Without treatment, PVD tends to get worse. PVD can also lead to acute ischemic limb. This is when an arm or leg suddenly cannot get enough blood. This is a medical emergency. Follow these instructions at home: Lifestyle  Do not use any products that contain nicotine or tobacco, such as cigarettes and e-cigarettes. If you need help quitting, ask your doctor.  Lose weight if you are overweight. Or, stay at a healthy weight as told by your doctor.  Eat a diet that is low in fat and cholesterol. If you need help, ask your doctor.  Exercise regularly. Ask your doctor for activities that are right for you. General instructions  Take over-the-counter and prescription medicines only as told by your doctor.  Take good care of your feet: ? Wear comfortable shoes that fit well. ? Check your feet often for any cuts or sores.  Keep all follow-up visits as told by your doctor This is important. Contact a doctor if:  You have cramps in your legs when you walk.  You have leg pain when you are at rest.  You have coldness in a leg or foot.  Your skin changes.  You are unable to get or have an erection (erectile dysfunction).  You have cuts or sores on your feet that do not heal. Get help right away if:  Your arm or leg turns cold, numb, and blue.  Your arms or legs become red, warm, swollen, painful, or numb.  You have chest pain.  You have trouble breathing.  You suddenly have weakness in your face, arm, or leg.  You become very  confused or you cannot speak.  You suddenly have a very bad headache.  You suddenly cannot see. Summary  Peripheral vascular disease (PVD) is a disease of the blood vessels.  A simple term for PVD is poor circulation. Without treatment, PVD tends to get worse.  Treatment may include exercise, low fat and low cholesterol diet, and quitting smoking. This information is not intended to replace advice given to you by your health care provider. Make sure you discuss any questions you have with your health care provider. Document Released: 03/16/2009 Document Revised: 12/02/2016 Document Reviewed: 01/28/2016 Elsevier Patient Education  2020 [employer]  

## 2018-09-26 ENCOUNTER — Telehealth (INDEPENDENT_AMBULATORY_CARE_PROVIDER_SITE_OTHER): Payer: Self-pay

## 2018-09-26 ENCOUNTER — Encounter (INDEPENDENT_AMBULATORY_CARE_PROVIDER_SITE_OTHER): Payer: Self-pay

## 2018-09-26 NOTE — Telephone Encounter (Signed)
Spoke with the patient and she is now scheduled with Dr. Lucky Cowboy for leg angio's. Left leg angio on 10/01/2018 with a 9:30 am arrival time to the [facility]. Patient will do her Covid test on 09/27/2018 between 12:30-2:30 pm at the [facility]. Pre-procedure instructions were discussed. Patient will have her right leg anigo on 10/08/2018 with a 8:00 am arrival time to the [facility]. I will mail out to the patient instructions and times.

## 2018-09-27 ENCOUNTER — Other Ambulatory Visit: Payer: Self-pay

## 2018-09-27 ENCOUNTER — Other Ambulatory Visit
Admission: RE | Admit: 2018-09-27 | Discharge: 2018-09-27 | Disposition: A | Discharge status: Home / Self Care | Payer: Medicare Other | Source: Ambulatory Visit | Attending: Vascular Surgery | Admitting: Vascular Surgery

## 2018-09-27 DIAGNOSIS — Z20828 Contact with and (suspected) exposure to other viral communicable diseases: Secondary | ICD-10-CM | POA: Diagnosis not present

## 2018-09-27 DIAGNOSIS — Z01812 Encounter for preprocedural laboratory examination: Secondary | ICD-10-CM | POA: Diagnosis not present

## 2018-09-28 LAB — SARS CORONAVIRUS 2 (TAT 6-24 HRS): SARS Coronavirus 2: NEGATIVE

## 2018-09-30 ENCOUNTER — Other Ambulatory Visit (INDEPENDENT_AMBULATORY_CARE_PROVIDER_SITE_OTHER): Payer: Self-pay | Admitting: Nurse Practitioner

## 2018-10-01 ENCOUNTER — Ambulatory Visit
Admission: RE | Admit: 2018-10-01 | Discharge: 2018-10-01 | Disposition: A | Discharge status: Home / Self Care | Payer: Medicare Other | Attending: Vascular Surgery | Admitting: Vascular Surgery

## 2018-10-01 ENCOUNTER — Encounter: Payer: Self-pay | Admitting: *Deleted

## 2018-10-01 ENCOUNTER — Other Ambulatory Visit: Payer: Self-pay

## 2018-10-01 ENCOUNTER — Encounter
Admission: RE | Disposition: A | Discharge status: Home / Self Care | Payer: Self-pay | Source: Home / Self Care | Attending: Vascular Surgery

## 2018-10-01 DIAGNOSIS — E039 Hypothyroidism, unspecified: Secondary | ICD-10-CM | POA: Diagnosis not present

## 2018-10-01 DIAGNOSIS — F419 Anxiety disorder, unspecified: Secondary | ICD-10-CM | POA: Diagnosis not present

## 2018-10-01 DIAGNOSIS — Z7982 Long term (current) use of aspirin: Secondary | ICD-10-CM | POA: Insufficient documentation

## 2018-10-01 DIAGNOSIS — I10 Essential (primary) hypertension: Secondary | ICD-10-CM | POA: Diagnosis not present

## 2018-10-01 DIAGNOSIS — Z79890 Hormone replacement therapy (postmenopausal): Secondary | ICD-10-CM | POA: Diagnosis not present

## 2018-10-01 DIAGNOSIS — M1990 Unspecified osteoarthritis, unspecified site: Secondary | ICD-10-CM | POA: Diagnosis not present

## 2018-10-01 DIAGNOSIS — I70223 Atherosclerosis of native arteries of extremities with rest pain, bilateral legs: Secondary | ICD-10-CM | POA: Diagnosis present

## 2018-10-01 DIAGNOSIS — E11319 Type 2 diabetes mellitus with unspecified diabetic retinopathy without macular edema: Secondary | ICD-10-CM | POA: Diagnosis not present

## 2018-10-01 DIAGNOSIS — Z794 Long term (current) use of insulin: Secondary | ICD-10-CM | POA: Diagnosis not present

## 2018-10-01 DIAGNOSIS — I70219 Atherosclerosis of native arteries of extremities with intermittent claudication, unspecified extremity: Secondary | ICD-10-CM

## 2018-10-01 DIAGNOSIS — E1140 Type 2 diabetes mellitus with diabetic neuropathy, unspecified: Secondary | ICD-10-CM | POA: Diagnosis not present

## 2018-10-01 DIAGNOSIS — M109 Gout, unspecified: Secondary | ICD-10-CM | POA: Insufficient documentation

## 2018-10-01 DIAGNOSIS — K219 Gastro-esophageal reflux disease without esophagitis: Secondary | ICD-10-CM | POA: Diagnosis not present

## 2018-10-01 DIAGNOSIS — E1151 Type 2 diabetes mellitus with diabetic peripheral angiopathy without gangrene: Secondary | ICD-10-CM | POA: Insufficient documentation

## 2018-10-01 DIAGNOSIS — E785 Hyperlipidemia, unspecified: Secondary | ICD-10-CM | POA: Diagnosis not present

## 2018-10-01 DIAGNOSIS — Z79899 Other long term (current) drug therapy: Secondary | ICD-10-CM | POA: Insufficient documentation

## 2018-10-01 HISTORY — PX: LOWER EXTREMITY ANGIOGRAPHY: CATH118251

## 2018-10-01 LAB — BUN: BUN: 21 mg/dL (ref 8–23)

## 2018-10-01 LAB — CREATININE, SERUM
Creatinine, Ser: 1.35 mg/dL — ABNORMAL HIGH (ref 0.44–1.00)
GFR calc Af Amer: 45 mL/min — ABNORMAL LOW (ref >60)
GFR calc non Af Amer: 39 mL/min — ABNORMAL LOW (ref >60)

## 2018-10-01 SURGERY — LOWER EXTREMITY ANGIOGRAPHY
Anesthesia: Moderate Sedation | Site: Leg Lower | Laterality: Left

## 2018-10-01 MED ORDER — MIDAZOLAM HCL 2 MG/2ML IJ SOLN
INTRAMUSCULAR | Status: DC | PRN
  Administered 2018-10-01: 1 mg via INTRAVENOUS
  Administered 2018-10-01 (×2): 0.5 mg via INTRAVENOUS
  Administered 2018-10-01: 2 mg via INTRAVENOUS

## 2018-10-01 MED ORDER — SODIUM CHLORIDE 0.9% FLUSH: 3.0000 mL | INTRAVENOUS | Status: DC | PRN

## 2018-10-01 MED ORDER — HYDRALAZINE HCL 20 MG/ML IJ SOLN: 5.0000 mg | INTRAMUSCULAR | Status: DC | PRN

## 2018-10-01 MED ORDER — ACETAMINOPHEN 325 MG PO TABS: 650.0000 mg | ORAL_TABLET | ORAL | Status: DC | PRN

## 2018-10-01 MED ORDER — DIPHENHYDRAMINE HCL 50 MG/ML IJ SOLN: 50.0000 mg | Freq: Once | INTRAMUSCULAR | Status: DC | PRN

## 2018-10-01 MED ORDER — ALTEPLASE 2 MG IJ SOLR
INTRAMUSCULAR | Status: DC | PRN
  Administered 2018-10-01: 6 mg

## 2018-10-01 MED ORDER — FENTANYL CITRATE (PF) 100 MCG/2ML IJ SOLN
INTRAMUSCULAR | Status: AC
  Filled 2018-10-01: qty 2

## 2018-10-01 MED ORDER — FENTANYL CITRATE (PF) 100 MCG/2ML IJ SOLN
INTRAMUSCULAR | Status: DC | PRN
  Administered 2018-10-01: 50 ug via INTRAVENOUS
  Administered 2018-10-01 (×2): 25 ug via INTRAVENOUS
  Administered 2018-10-01: 50 ug via INTRAVENOUS

## 2018-10-01 MED ORDER — SODIUM CHLORIDE 0.9% FLUSH: 3.0000 mL | Freq: Two times a day (BID) | INTRAVENOUS | Status: DC

## 2018-10-01 MED ORDER — CEFAZOLIN SODIUM-DEXTROSE 2-4 GM/100ML-% IV SOLN
INTRAVENOUS | Status: AC
  Administered 2018-10-01: 11:00:00 2 g via INTRAVENOUS
  Filled 2018-10-01: qty 100

## 2018-10-01 MED ORDER — HEPARIN SODIUM (PORCINE) 1000 UNIT/ML IJ SOLN
INTRAMUSCULAR | Status: DC | PRN
  Administered 2018-10-01: 5000 [IU] via INTRAVENOUS

## 2018-10-01 MED ORDER — SODIUM CHLORIDE 0.9 % IV SOLN: 250.0000 mL | INTRAVENOUS | Status: DC | PRN

## 2018-10-01 MED ORDER — METHYLPREDNISOLONE SODIUM SUCC 125 MG IJ SOLR: 125.0000 mg | Freq: Once | INTRAMUSCULAR | Status: DC | PRN

## 2018-10-01 MED ORDER — MIDAZOLAM HCL 5 MG/5ML IJ SOLN
INTRAMUSCULAR | Status: AC
  Filled 2018-10-01: qty 5

## 2018-10-01 MED ORDER — HYDROMORPHONE HCL 1 MG/ML IJ SOLN: 1.0000 mg | Freq: Once | INTRAMUSCULAR | Status: DC | PRN

## 2018-10-01 MED ORDER — MIDAZOLAM HCL 2 MG/ML PO SYRP: 8.0000 mg | ORAL_SOLUTION | Freq: Once | ORAL | Status: DC | PRN

## 2018-10-01 MED ORDER — ONDANSETRON HCL 4 MG/2ML IJ SOLN: 4.0000 mg | Freq: Four times a day (QID) | INTRAMUSCULAR | Status: DC | PRN

## 2018-10-01 MED ORDER — FAMOTIDINE 20 MG PO TABS: 40.0000 mg | ORAL_TABLET | Freq: Once | ORAL | Status: DC | PRN

## 2018-10-01 MED ORDER — SODIUM CHLORIDE 0.9 % IV SOLN
INTRAVENOUS | Status: DC
  Administered 2018-10-01: 11:00:00 via INTRAVENOUS

## 2018-10-01 MED ORDER — CEFAZOLIN SODIUM-DEXTROSE 2-4 GM/100ML-% IV SOLN
2.0000 g | Freq: Once | INTRAVENOUS | Status: AC
  Administered 2018-10-01: 11:00:00 2 g via INTRAVENOUS

## 2018-10-01 MED ORDER — SODIUM CHLORIDE 0.9 % IV SOLN: INTRAVENOUS | Status: DC

## 2018-10-01 MED ORDER — APIXABAN 5 MG PO TABS: 5.0000 mg | ORAL_TABLET | Freq: Two times a day (BID) | ORAL | 2 refills | Status: DC

## 2018-10-01 MED ORDER — HEPARIN SODIUM (PORCINE) 1000 UNIT/ML IJ SOLN
INTRAMUSCULAR | Status: AC
  Filled 2018-10-01: qty 1

## 2018-10-01 MED ORDER — LABETALOL HCL 5 MG/ML IV SOLN: 10.0000 mg | INTRAVENOUS | Status: DC | PRN

## 2018-10-01 SURGICAL SUPPLY — 26 items
BALLN LUTONIX 018 5X150X130 (BALLOONS) ×3
BALLN LUTONIX 018 5X300X130 (BALLOONS) ×3
BALLOON LUTONIX 018 5X150X130 (BALLOONS) ×1 IMPLANT
BALLOON LUTONIX 018 5X300X130 (BALLOONS) ×1 IMPLANT
CANISTER PENUMBRA ENGINE (MISCELLANEOUS) ×3 IMPLANT
CANNULA 5F STIFF (CANNULA) ×3 IMPLANT
CATH BEACON 5 .038 100 VERT TP (CATHETERS) ×3 IMPLANT
CATH INDIGO CAT6 KIT (CATHETERS) ×3 IMPLANT
CATH PIG 70CM (CATHETERS) ×3 IMPLANT
COVER PROBE U/S 5X48 (MISCELLANEOUS) ×3 IMPLANT
DEVICE PRESTO INFLATION (MISCELLANEOUS) ×3 IMPLANT
DEVICE STARCLOSE SE CLOSURE (Vascular Products) ×3 IMPLANT
GLIDEWIRE ADV .035X260CM (WIRE) ×3 IMPLANT
GUIDEWIRE SUPER STIFF .035X180 (WIRE) ×3 IMPLANT
PACK ANGIOGRAPHY (CUSTOM PROCEDURE TRAY) ×3 IMPLANT
SHEATH ANL2 6FRX45 HC (SHEATH) ×6 IMPLANT
SHEATH BRITE TIP 4FRX11 (SHEATH) ×3 IMPLANT
SHEATH BRITE TIP 5FRX11 (SHEATH) ×6 IMPLANT
SHEATH BRITE TIP 7FRX11 (SHEATH) ×3 IMPLANT
SHEATH PINNACLE ST 6F 45CM (SHEATH) ×3 IMPLANT
STENT VIABAHN 6X50X120 (Permanent Stent) ×3 IMPLANT
STENT VIABAHN 6X7.5X120 (Permanent Stent) ×3 IMPLANT
SYR MEDRAD MARK 7 150ML (SYRINGE) ×3 IMPLANT
TUBING CONTRAST HIGH PRESS 72 (TUBING) ×3 IMPLANT
WIRE G V18X300CM (WIRE) ×3 IMPLANT
WIRE J 3MM .035X145CM (WIRE) ×3 IMPLANT

## 2018-10-01 NOTE — Op Note (Signed)
[facility]  Percutaneous Study/Intervention Procedural Note   Date of Surgery: 10/01/2018  Surgeon(s):DEW,JASON    Assistants:none  Pre-operative Diagnosis: PAD with rest pain bilateral lower extremities  Post-operative diagnosis:  Same  Procedure(s) Performed:             1.  Ultrasound guidance for vascular access right femoral artery             2.  Catheter placement into left common femoral artery from right femoral approach             3.  Aortogram and selective left lower extremity angiogram             4.   Catheter directed thrombolytic therapy with 6 mg of TPA instilled in the left SFA and popliteal arteries             5.   Mechanical thrombectomy to the left SFA and popliteal arteries with the penumbra cat 6 device  6.  Percutaneous transluminal angioplasty of the left external iliac artery and common femoral artery with 5 mm diameter by 15 cm Lutonix drug-coated angioplasty balloon  7.  Percutaneous transluminal angioplasty of the entire left SFA and proximal popliteal artery with 5 mm diameter by 30 cm length Lutonix drug-coated angioplasty balloon  8.  Viabahn stent placement to the proximal popliteal artery and distal SFA with a 6 mm diameter by 7.5 cm length stent  9.  Viabahn stent placement to the proximal left SFA with a 6 mm diameter by 5 cm length stent             10.  StarClose closure device right femoral artery  EBL: 250 cc  Contrast: 110 cc  Fluoro Time: 15.9 minutes  Moderate Conscious Sedation Time: approximately 50 minutes using 4 mg of Versed and 150 mcg of Fentanyl              Indications:  Patient is a 73 y.o.female with recurrent ischemic symptoms now with rest pain of the legs. The patient has noninvasive study showing a marked drop in her ABIs bilaterally from previous studies with almost undetectable digital pressures. The patient is brought in for angiography for further evaluation and potential treatment.  Due to the  limb threatening nature of the situation, angiogram was performed for attempted limb salvage. The patient is aware that if the procedure fails, amputation would be expected.  The patient also understands that even with successful revascularization, amputation may still be required due to the severity of the situation.  Risks and benefits are discussed and informed consent is obtained.   Procedure:  The patient was identified and appropriate procedural time out was performed.  The patient was then placed supine on the table and prepped and draped in the usual sterile fashion. Moderate conscious sedation was administered during a face to face encounter with the patient throughout the procedure with my supervision of the RN administering medicines and monitoring the patient's vital signs, pulse oximetry, telemetry and mental status throughout from the start of the procedure until the patient was taken to the recovery room. Ultrasound was used to evaluate the right common femoral artery.  It was patent .  A digital ultrasound image was acquired.  A Seldinger needle was used to access the right common femoral artery under direct ultrasound guidance and a permanent image was performed.  A 0.035 J wire was advanced without resistance and a 5Fr sheath was placed.  Pigtail catheter was   placed into the aorta and an AP aortogram was performed. This demonstrated normal renal arteries and normal aorta.  The left common iliac artery had a mild stenosis in the 30 to 40% range.  The right common iliac artery had no significant stenosis.  The right external iliac artery appeared to be widely patent.  The left external iliac artery is patent until the most distal segment at the top of the femoral head and through the common femoral artery that hyperplasia resulting in 60 to 70% stenosis.  This was from her previous endarterectomy site with recurrent stenosis in the common femoral artery and distal external iliac artery. I then  crossed the aortic bifurcation and advanced to the left femoral head. Selective left lower extremity angiogram was then performed. This demonstrated occlusion of the SFA at its origin just above the previously placed stents.  The profunda femoris artery was patent although the common femoral artery was diseased as described above.  There was reconstitution of the popliteal artery several centimeters below the previously placed stents and there appeared to be two-vessel runoff distally although this was sluggish due to the poor inflow. It was felt that it was in the patient's best interest to proceed with intervention after these images to avoid a second procedure and a larger amount of contrast and fluoroscopy based off of the findings from the initial angiogram. The patient was systemically heparinized and a 6 [place] destination sheath was then placed over the Terumo Advantage wire. I then used a Kumpe catheter and the advantage wire to easily navigate into the SFA and through the occlusion confirming intraluminal flow in the popliteal artery at the level of the knee.  There was clearly a thrombotic process involved so I instilled 6 mg of TPA throughout the occluded SFA and proximal popliteal stents.  After this dwelled for several minutes, the penumbra cat 6 device was brought onto the field over a V 18 wire and mechanical thrombectomy was performed of the left SFA and popliteal arteries.  Multiple passes were made with significant improvement although there remained stenosis and thrombus just above the previously placed stents and at the bottom of the previously placed stents and just below them in the proximal popliteal artery.  I began by trying angioplasty of the entire SFA and proximal popliteal artery with a 5 mm diameter by 30 cm length Lutonix drug-coated angioplasty balloon inflated to 8 atm for 1 minute.  I then treated the common femoral artery and left external iliac artery with a 5 mm diameter by 15  cm length Lutonix drug-coated angioplasty balloon inflated to 12 atm for 1 minute.  The distal external iliac artery and common femoral artery are markedly improved with less than 20% residual stenosis.  There remained thrombus and stenosis in the proximal SFA as well as a blob of thrombus at the distal edge of the previously placed stents in the distal SFA and proximal popliteal artery.  Another pass with the penumbra cat 6 device showed minimal improvement.  I then elected to cover these areas.  A 6 mm diameter by 7.5 cm length Viabahn stent was then deployed in the distal SFA and proximal popliteal artery taking care not to occlude a large geniculate collateral.  This was postdilated with a 5 mm balloon with excellent angiographic completion result and less than 10% residual stenosis.  A 6 mm diameter by 5 cm length viabahn stent was then deployed in the proximal SFA taking care to stay just below the  origin of the profunda femoris artery and avoid injury to this.  This was postdilated with a 5 mm balloon with excellent angiographic completion result and less than 10% residual stenosis. I elected to terminate the procedure. The sheath was removed and StarClose closure device was deployed in the right femoral artery with excellent hemostatic result. The patient was taken to the recovery room in stable condition having tolerated the procedure well.  Findings:               Aortogram:  This demonstrated normal renal arteries and normal aorta.  The left common iliac artery had a mild stenosis in the 30 to 40% range.  The right common iliac artery had no significant stenosis.  The right external iliac artery appeared to be widely patent.  The left external iliac artery is patent until the most distal segment at the top of the femoral head and through the common femoral artery that hyperplasia resulting in 60 to 70% stenosis.  This was from her previous endarterectomy site with recurrent stenosis in the common  femoral artery and distal external iliac artery.             Left lower Extremity:  Occlusion of the SFA at its origin just above the previously placed stents.  The profunda femoris artery was patent although the common femoral artery was diseased as described above.  There was reconstitution of the popliteal artery several centimeters below the previously placed stents and there appeared to be two-vessel runoff distally although this was sluggish due to the poor inflow   Disposition: Patient was taken to the recovery room in stable condition having tolerated the procedure well.  Complications: None  Leotis Pain 10/01/2018 12:38 PM   This note was created with Dragon Medical transcription system. Any errors in dictation are purely unintentional.

## 2018-10-01 NOTE — H&P (Signed)
[facility] History & Physical Update  The patient was interviewed and re-examined.  The patient's previous History and Physical has been reviewed and is unchanged.  There is no change in the plan of care. We plan to proceed with the scheduled procedure.  Leotis Pain, MD  10/01/2018, 9:16 AM

## 2018-10-01 NOTE — Discharge Instructions (Signed)

## 2018-10-02 ENCOUNTER — Telehealth (INDEPENDENT_AMBULATORY_CARE_PROVIDER_SITE_OTHER): Payer: Self-pay | Admitting: Vascular Surgery

## 2018-10-02 NOTE — Telephone Encounter (Signed)
Patient husband calling stating that his wife didn't know she needed to stop taking the Xarelto until after she had the procedure and so she was taking it when she had the procedure. Wanted to know if that was okay. Also stated that she is sore today from the procedure from yesterday.  Spoke with Dew-he advised that her Xarelto was such a low dose that she was fine and that if they were going to have issue it would have been yesterday. Also said that it is normal to be sore. That patient had a lot of work done yesterday she is expected to have soreness.  Patient husband is aware and verbalized understanding. AS, CMA

## 2018-10-04 ENCOUNTER — Other Ambulatory Visit
Admission: RE | Admit: 2018-10-04 | Discharge: 2018-10-04 | Disposition: A | Discharge status: Home / Self Care | Payer: Medicare Other | Source: Ambulatory Visit | Attending: Vascular Surgery | Admitting: Vascular Surgery

## 2018-10-04 ENCOUNTER — Other Ambulatory Visit: Payer: Self-pay

## 2018-10-04 DIAGNOSIS — Z20828 Contact with and (suspected) exposure to other viral communicable diseases: Secondary | ICD-10-CM | POA: Diagnosis not present

## 2018-10-05 LAB — SARS CORONAVIRUS 2 (TAT 6-24 HRS): SARS Coronavirus 2: NEGATIVE

## 2018-10-07 ENCOUNTER — Other Ambulatory Visit (INDEPENDENT_AMBULATORY_CARE_PROVIDER_SITE_OTHER): Payer: Self-pay | Admitting: Nurse Practitioner

## 2018-10-08 ENCOUNTER — Encounter
Admission: RE | Disposition: A | Discharge status: Home / Self Care | Payer: Self-pay | Source: Home / Self Care | Attending: Vascular Surgery

## 2018-10-08 ENCOUNTER — Ambulatory Visit
Admission: RE | Admit: 2018-10-08 | Discharge: 2018-10-08 | Disposition: A | Discharge status: Home / Self Care | Payer: Medicare Other | Attending: Vascular Surgery | Admitting: Vascular Surgery

## 2018-10-08 DIAGNOSIS — E1151 Type 2 diabetes mellitus with diabetic peripheral angiopathy without gangrene: Secondary | ICD-10-CM | POA: Insufficient documentation

## 2018-10-08 DIAGNOSIS — Z794 Long term (current) use of insulin: Secondary | ICD-10-CM | POA: Insufficient documentation

## 2018-10-08 DIAGNOSIS — E785 Hyperlipidemia, unspecified: Secondary | ICD-10-CM | POA: Diagnosis not present

## 2018-10-08 DIAGNOSIS — E039 Hypothyroidism, unspecified: Secondary | ICD-10-CM | POA: Diagnosis not present

## 2018-10-08 DIAGNOSIS — Z79899 Other long term (current) drug therapy: Secondary | ICD-10-CM | POA: Diagnosis not present

## 2018-10-08 DIAGNOSIS — E1140 Type 2 diabetes mellitus with diabetic neuropathy, unspecified: Secondary | ICD-10-CM | POA: Diagnosis not present

## 2018-10-08 DIAGNOSIS — M109 Gout, unspecified: Secondary | ICD-10-CM | POA: Insufficient documentation

## 2018-10-08 DIAGNOSIS — I70223 Atherosclerosis of native arteries of extremities with rest pain, bilateral legs: Secondary | ICD-10-CM | POA: Diagnosis present

## 2018-10-08 DIAGNOSIS — Z7982 Long term (current) use of aspirin: Secondary | ICD-10-CM | POA: Insufficient documentation

## 2018-10-08 DIAGNOSIS — M1990 Unspecified osteoarthritis, unspecified site: Secondary | ICD-10-CM | POA: Insufficient documentation

## 2018-10-08 DIAGNOSIS — I10 Essential (primary) hypertension: Secondary | ICD-10-CM | POA: Diagnosis not present

## 2018-10-08 DIAGNOSIS — K219 Gastro-esophageal reflux disease without esophagitis: Secondary | ICD-10-CM | POA: Diagnosis not present

## 2018-10-08 DIAGNOSIS — Z7902 Long term (current) use of antithrombotics/antiplatelets: Secondary | ICD-10-CM | POA: Diagnosis not present

## 2018-10-08 DIAGNOSIS — Z87891 Personal history of nicotine dependence: Secondary | ICD-10-CM | POA: Diagnosis not present

## 2018-10-08 DIAGNOSIS — I70219 Atherosclerosis of native arteries of extremities with intermittent claudication, unspecified extremity: Secondary | ICD-10-CM

## 2018-10-08 HISTORY — PX: LOWER EXTREMITY ANGIOGRAPHY: CATH118251

## 2018-10-08 LAB — CREATININE, SERUM
Creatinine, Ser: 1.68 mg/dL — ABNORMAL HIGH (ref 0.44–1.00)
GFR calc Af Amer: 35 mL/min — ABNORMAL LOW (ref >60)
GFR calc non Af Amer: 30 mL/min — ABNORMAL LOW (ref >60)

## 2018-10-08 LAB — GLUCOSE, CAPILLARY
Glucose-Capillary: 155 mg/dL — ABNORMAL HIGH (ref 70–99)
Glucose-Capillary: 142 mg/dL — ABNORMAL HIGH (ref 70–99)

## 2018-10-08 LAB — BUN: BUN: 24 mg/dL — ABNORMAL HIGH (ref 8–23)

## 2018-10-08 SURGERY — LOWER EXTREMITY ANGIOGRAPHY
Anesthesia: Moderate Sedation | Site: Leg Lower | Laterality: Right

## 2018-10-08 MED ORDER — FENTANYL CITRATE (PF) 100 MCG/2ML IJ SOLN
INTRAMUSCULAR | Status: AC
  Filled 2018-10-08: qty 2

## 2018-10-08 MED ORDER — LABETALOL HCL 5 MG/ML IV SOLN: 10.0000 mg | INTRAVENOUS | Status: DC | PRN

## 2018-10-08 MED ORDER — HEPARIN SODIUM (PORCINE) 1000 UNIT/ML IJ SOLN
INTRAMUSCULAR | Status: AC
  Filled 2018-10-08: qty 1

## 2018-10-08 MED ORDER — HYDROMORPHONE HCL 1 MG/ML IJ SOLN: 1.0000 mg | Freq: Once | INTRAMUSCULAR | Status: DC | PRN

## 2018-10-08 MED ORDER — NITROGLYCERIN 1 MG/10 ML FOR IR/CATH LAB
INTRA_ARTERIAL | Status: DC | PRN
  Administered 2018-10-08: 200 ug

## 2018-10-08 MED ORDER — NITROGLYCERIN 1 MG/10 ML FOR IR/CATH LAB
INTRA_ARTERIAL | Status: AC
  Filled 2018-10-08: qty 10

## 2018-10-08 MED ORDER — ACETAMINOPHEN 325 MG PO TABS: 650.0000 mg | ORAL_TABLET | ORAL | Status: DC | PRN

## 2018-10-08 MED ORDER — IODIXANOL 320 MG/ML IV SOLN
INTRAVENOUS | Status: DC | PRN
  Administered 2018-10-08: 90 mL via INTRA_ARTERIAL

## 2018-10-08 MED ORDER — FAMOTIDINE 20 MG PO TABS: 40.0000 mg | ORAL_TABLET | Freq: Once | ORAL | Status: DC | PRN

## 2018-10-08 MED ORDER — HYDRALAZINE HCL 20 MG/ML IJ SOLN: 5.0000 mg | INTRAMUSCULAR | Status: DC | PRN

## 2018-10-08 MED ORDER — SODIUM CHLORIDE 0.9% FLUSH: 3.0000 mL | INTRAVENOUS | Status: DC | PRN

## 2018-10-08 MED ORDER — DIPHENHYDRAMINE HCL 50 MG/ML IJ SOLN: 50.0000 mg | Freq: Once | INTRAMUSCULAR | Status: DC | PRN

## 2018-10-08 MED ORDER — MIDAZOLAM HCL 2 MG/ML PO SYRP: 8.0000 mg | ORAL_SOLUTION | Freq: Once | ORAL | Status: DC | PRN

## 2018-10-08 MED ORDER — HEPARIN SODIUM (PORCINE) 1000 UNIT/ML IJ SOLN
INTRAMUSCULAR | Status: DC | PRN
  Administered 2018-10-08: 5000 [IU] via INTRAVENOUS

## 2018-10-08 MED ORDER — CEFAZOLIN SODIUM-DEXTROSE 2-4 GM/100ML-% IV SOLN
2.0000 g | Freq: Once | INTRAVENOUS | Status: AC
  Administered 2018-10-08: 09:00:00 2 g via INTRAVENOUS

## 2018-10-08 MED ORDER — MIDAZOLAM HCL 2 MG/2ML IJ SOLN
INTRAMUSCULAR | Status: DC | PRN
  Administered 2018-10-08: 2 mg via INTRAVENOUS

## 2018-10-08 MED ORDER — SODIUM CHLORIDE 0.9 % IV SOLN
INTRAVENOUS | Status: DC
  Administered 2018-10-08: 08:00:00 via INTRAVENOUS

## 2018-10-08 MED ORDER — METHYLPREDNISOLONE SODIUM SUCC 125 MG IJ SOLR: 125.0000 mg | Freq: Once | INTRAMUSCULAR | Status: DC | PRN

## 2018-10-08 MED ORDER — SODIUM CHLORIDE 0.9 % IV SOLN: INTRAVENOUS | Status: DC

## 2018-10-08 MED ORDER — SODIUM CHLORIDE 0.9% FLUSH: 3.0000 mL | Freq: Two times a day (BID) | INTRAVENOUS | Status: DC

## 2018-10-08 MED ORDER — FENTANYL CITRATE (PF) 100 MCG/2ML IJ SOLN
INTRAMUSCULAR | Status: DC | PRN
  Administered 2018-10-08: 50 ug via INTRAVENOUS

## 2018-10-08 MED ORDER — MIDAZOLAM HCL 5 MG/5ML IJ SOLN
INTRAMUSCULAR | Status: AC
  Filled 2018-10-08: qty 5

## 2018-10-08 MED ORDER — SODIUM CHLORIDE 0.9 % IV SOLN: 250.0000 mL | INTRAVENOUS | Status: DC | PRN

## 2018-10-08 MED ORDER — ONDANSETRON HCL 4 MG/2ML IJ SOLN: 4.0000 mg | Freq: Four times a day (QID) | INTRAMUSCULAR | Status: DC | PRN

## 2018-10-08 MED ORDER — ALTEPLASE 2 MG IJ SOLR
INTRAMUSCULAR | Status: DC | PRN
  Administered 2018-10-08: 4 mg

## 2018-10-08 SURGICAL SUPPLY — 23 items
BALLN LUTONIX 018 5X100X130 (BALLOONS) ×3
BALLN LUTONIX 018 5X300X130 (BALLOONS) ×3
BALLN ULTRVRSE 018 2.5X150X150 (BALLOONS) ×3
BALLOON LUTONIX 018 5X100X130 (BALLOONS) IMPLANT
BALLOON LUTONIX 018 5X300X130 (BALLOONS) IMPLANT
BALLOON ULTRVS 018 2.5X150X150 (BALLOONS) IMPLANT
CANISTER PENUMBRA ENGINE (MISCELLANEOUS) ×2 IMPLANT
CANNULA 5F STIFF (CANNULA) ×2 IMPLANT
CATH BEACON 5 .035 65 RIM TIP (CATHETERS) ×2 IMPLANT
CATH BEACON 5 .038 100 VERT TP (CATHETERS) ×2 IMPLANT
CATH INDIGO CAT6 KIT (CATHETERS) ×2 IMPLANT
DEVICE PRESTO INFLATION (MISCELLANEOUS) ×2 IMPLANT
DEVICE STARCLOSE SE CLOSURE (Vascular Products) ×2 IMPLANT
GLIDEWIRE ADV .035X260CM (WIRE) ×2 IMPLANT
GUIDEWIRE SUPER STIFF .035X180 (WIRE) ×2 IMPLANT
PACK ANGIOGRAPHY (CUSTOM PROCEDURE TRAY) ×3 IMPLANT
SHEATH BRITE TIP 4FRX11 (SHEATH) ×2 IMPLANT
SHEATH BRITE TIP 5FRX11 (SHEATH) ×2 IMPLANT
SHEATH DESTIN RDC 6FR 45 (SHEATH) ×2 IMPLANT
STENT VIABAHN 6X50X120 (Permanent Stent) ×2 IMPLANT
STENT VIABAHN 6X7.5X120 (Permanent Stent) ×2 IMPLANT
WIRE G V18X300CM (WIRE) ×2 IMPLANT
WIRE J 3MM .035X145CM (WIRE) ×2 IMPLANT

## 2018-10-08 NOTE — Progress Notes (Signed)
Dr. Lucky Cowboy at bedside, speaking with pt. And her husband re: procedural results. Both verbalize understanding of conversation.

## 2018-10-08 NOTE — Op Note (Signed)
[facility]  Percutaneous Study/Intervention Procedural Note   Date of Surgery: 10/08/2018  Surgeon(s):Dhrithi Riche    Assistants:none  Pre-operative Diagnosis: PAD with claudication and early rest pain right lower extremity status post multiple previous interventions  Post-operative diagnosis:  Same  Procedure(s) Performed:             1.  Ultrasound guidance for vascular access left femoral artery             2.  Catheter placement into right common femoral artery from left femoral approach             3.  Aortogram and selective right lower extremity angiogram             4.   Mechanical thrombectomy of the right SFA, popliteal artery, and posterior tibial arteries with the penumbra cat 6 device             5.   Percutaneous transluminal angioplasty of the right SFA and proximal popliteal artery with 5 mm diameter by 30 cm length Lutonix drug-coated angioplasty balloon  6.  Viabahn stent placement to the distal SFA and proximal popliteal artery with 6 mm diameter by 7.5 cm length stent  7.  Viabahn stent placement to the proximal right SFA with 6 mm diameter by 5 cm length stent  8.  Percutaneous transluminal angioplasty of the right posterior tibial artery with 2.5 mm diameter by 15 cm length angioplasty balloon             9.  StarClose closure device left femoral artery  EBL: 250 cc  Contrast: 90 cc  Fluoro Time: 13.6 minutes  Moderate Conscious Sedation Time: approximately 55 minutes using 2 mg of Versed and 50 mcg of Fentanyl              Indications:  Patient is a 73 y.o.female with severe peripheral arterial disease despite multiple previous interventions. The patient has noninvasive study showing markedly reduced ABIs bilaterally.  She is already undergone left iliac and left lower extremity revascularization last week and is brought back in for treatment of the right leg but has nearly flat digital pressure and a markedly reduced ABI. The patient is  brought in for angiography for further evaluation and potential treatment.  Due to the limb threatening nature of the situation, angiogram was performed for attempted limb salvage. The patient is aware that if the procedure fails, amputation would be expected.  The patient also understands that even with successful revascularization, amputation may still be required due to the severity of the situation.  Risks and benefits are discussed and informed consent is obtained.   Procedure:  The patient was identified and appropriate procedural time out was performed.  The patient was then placed supine on the table and prepped and draped in the usual sterile fashion. Moderate conscious sedation was administered during a face to face encounter with the patient throughout the procedure with my supervision of the RN administering medicines and monitoring the patient's vital signs, pulse oximetry, telemetry and mental status throughout from the start of the procedure until the patient was taken to the recovery room. Ultrasound was used to evaluate the left common femoral artery.  It was patent .  A digital ultrasound image was acquired.  A Seldinger needle was used to access the left common femoral artery under direct ultrasound guidance and a permanent image was performed.  A 0.035 J wire was advanced without resistance and a 5Fr sheath  was placed. I then crossed the aortic bifurcation and advanced to the right femoral head. Selective right lower extremity angiogram was then performed. This demonstrated Flush occlusion of the SFA at its origin just above the previously placed stents.  There was reconstitution of the popliteal artery below the previously placed stents.  The tibial runoff was a little difficult to opacify but there appeared to be a true tibial bifurcation with at least two-vessel runoff distally. It was felt that it was in the patient's best interest to proceed with intervention after these images to avoid a  second procedure and a larger amount of contrast and fluoroscopy based off of the findings from the initial angiogram. The patient was systemically heparinized and a 6 [place] destination sheath was then placed over the Terumo Advantage wire. I then used a Kumpe catheter and the advantage wire to navigate through the occlusion with no resistance whatsoever consistent with a thrombotic process.  She also had normal studies 3 months ago which would suggest a thrombotic process.  I then placed a 0.018 wire and remove the Kumpe catheter.  2 passes with the penumbra cat 6 device were performed throughout the SFA and popliteal artery with significant thrombus removed but residual high-grade stenosis at the top of the previously placed stent in the proximal SFA and a large blob of thrombus and stenosis at the distal end of the previously placed stent in the distal SFA and proximal popliteal artery.  Another pass with the penumbra cat 6 device did not improve this so I elected to perform angioplasty.  A 5 mm diameter by 30 cm length Lutonix drug-coated angioplasty balloon was inflated from the proximal popliteal artery up to the common femoral artery encompassing the entire SFA.  This was taken to 10 atm for 1 minute.  Completion imaging showed residual high-grade stenosis/thrombus both at the proximal edge of the previously placed stent as well as the distal edge of the previously placed stent.  I elected to cover these areas with covered stents.  For the distal portion which encompassed the distal SFA most proximal popliteal artery a 6 mm diameter by 7.5 cm length Viabahn stent was deployed.  For the origin of the SFA a 6 mm diameter by 5 cm length Viabahn stent was deployed to the origin of the SFA taking care not to harm the profunda femoris artery.  These were postdilated with 5 mm diameter Lutonix drug-coated angioplasty balloon with excellent angiographic completion result and less than 10% residual stenosis  proximally and less than 20% residual stenosis distally.  Completion imaging did show occlusion of the posterior tibial artery in the proximal segment likely from thrombus.  To improve her runoff, the penumbra cat 6 device was brought back onto the field and thrombectomy was performed of the posterior tibial artery with the cat 6 device.  The blob of thrombus removed but there was significant narrowing in the first 8 to 10 cm of the vessel.  It was unclear if this was spasm or stenosis at this point so I instilled intra-arterial nitroglycerin 200 mcg.  Imaging following this showed no improvement in this narrowing in the proximal posterior tibial artery so I elected to perform angioplasty.  A 2.5 mm diameter by 15 cm length angioplasty balloon was inflated in the posterior tibial artery up to 10 atm for 1 minute.  Completion imaging showed a marked improvement with less than 20% residual stenosis and the patient now had three-vessel runoff distally. I elected to terminate  the procedure. The sheath was removed and StarClose closure device was deployed in the left femoral artery with excellent hemostatic result. The patient was taken to the recovery room in stable condition having tolerated the procedure well.  Findings:                            Right lower Extremity:  Flush occlusion of the SFA at its origin just above the previously placed stents.  There was reconstitution of the popliteal artery below the previously placed stents.  The tibial runoff was a little difficult to opacify but there appeared to be a true tibial bifurcation with at least two-vessel runoff distally.   Disposition: Patient was taken to the recovery room in stable condition having tolerated the procedure well.  Complications: None  Leotis Pain 10/08/2018 10:45 AM   This note was created with Dragon Medical transcription system. Any errors in dictation are purely unintentional.

## 2018-10-08 NOTE — H&P (Signed)
[facility] History & Physical Update  The patient was interviewed and re-examined.  The patient's previous History and Physical has been reviewed and is unchanged.  There is no change in the plan of care. We plan to proceed with the scheduled procedure.  Leotis Pain, MD  10/08/2018, 8:01 AM

## 2018-10-08 NOTE — Progress Notes (Signed)
[facility] Note  10/16/2018     CHIEF COMPLAINT Patient presents for Retina Follow Up   HISTORY OF PRESENT ILLNESS: Cheryl Leonard is a 73 y.o. female who presents to the clinic today for:   HPI    Retina Follow Up    Patient presents with  CRVO/BRVO.  In right eye.  This started months ago.  Severity is moderate.  Duration of 6 weeks.  Since onset it is stable.  I, the attending physician,  performed the HPI with the patient and updated documentation appropriately.          Comments    73 y/o female pt here for 6 wk f/u for BRVO w/mac edema OD.  S/p IVE #10 OD on 8.28.20.  No change in [place] OU.  Denies pain, flashes, floaters.  Had a bout of horizontal diplopia for just a few minutes a couple of weeks ago.  No obvious trigger, and symptom went away on its own.  No gtts.  BS 120.  A1C 6.0.       Last edited by Bernarda Caffey, MD on 10/16/2018  8:17 PM. (History)    Patient states vision improved OD. Had a couple of episodes of horizontal diplopia. Diplopia went away when target came closer.    Referring physician: [facility] [address] [place],  [place] [id number]  HISTORICAL INFORMATION:   Selected notes from the MEDICAL RECORD NUMBER Referred by Dr. Marvel Plan for concern of DME OD;  LEE- 02.05.19 [BCVA OD: 20/70-1 OS: 20/30-1] Ocular Hx-  PMH- DM Lab Results  Component Value Date   HGBA1C 5.9 (H) 10/29/2015       CURRENT MEDICATIONS: Current Outpatient Medications (Ophthalmic Drugs)  Medication Sig  . Aflibercept (EYLEA) 2 MG/0.05ML SOLN by Intravitreal route.   Current Facility-Administered Medications (Ophthalmic Drugs)  Medication Route  . aflibercept (EYLEA) SOLN 2 mg Intravitreal  . aflibercept (EYLEA) SOLN 2 mg Intravitreal  . aflibercept (EYLEA) SOLN 2 mg Intravitreal  . aflibercept (EYLEA) SOLN 2 mg Intravitreal  . aflibercept (EYLEA) SOLN 2 mg Intravitreal   Current Outpatient Medications (Other)   Medication Sig  . ALPRAZolam (XANAX) 0.25 MG tablet Take by mouth.  [name] apixaban (ELIQUIS) 5 MG TABS tablet Take 1 tablet (5 mg total) by mouth 2 (two) times daily.  [name] aspirin EC 81 MG tablet Take 81 mg by mouth daily.   [name] atorvastatin (LIPITOR) 20 MG tablet Take 20 mg by mouth at bedtime.  [name] buPROPion (WELLBUTRIN XL) 300 MG 24 hr tablet Take 1 tablet (300 mg total) by mouth daily.  [name] BYSTOLIC 5 MG tablet Take 5 mg by mouth daily.  . chlorhexidine (PERIDEX) 0.12 % solution RINSE FOR 30 SECONDS WITH 0.5 OZ TWICE A DAY  . cholecalciferol (VITAMIN D) 1000 units tablet Take 1,000 Units by mouth 2 (two) times daily.  [name] CINNAMON PO Take 1 tablet by mouth daily.  . collagenase (SANTYL) ointment Apply 1 application topically daily.  [name] CORAL CALCIUM PO Take 1 tablet by mouth daily.  [name] denosumab (PROLIA) 60 MG/ML SOLN injection Inject into the skin.  . DENTA 5000 PLUS 1.1 % CREA dental cream 2 (two) times daily. as directed  . DULoxetine (CYMBALTA) 60 MG capsule Take 1 capsule (60 mg total) by mouth 2 (two) times daily.  . famotidine (PEPCID) 40 MG tablet TAKE 1 TABLET BY MOUTH EVERY DAY AT NIGHT  . ferrous gluconate (FERGON) 324 MG tablet Take 324 mg by mouth  daily with breakfast.  . ferrous sulfate 325 (65 FE) MG tablet TAKE 1 TABLET BY MOUTH DAILY WITH BREAKFAST  . furosemide (LASIX) 20 MG tablet Take by mouth.  [name] gemfibrozil (LOPID) 600 MG tablet Take 600 mg by mouth 2 (two) times daily.  [name] HYDROcodone-acetaminophen (NORCO/VICODIN) 5-325 MG tablet Take 1-2 tablets by mouth every 6 (six) hours as needed for moderate pain or severe pain.  . Insulin Pen Needle (B-D UF III MINI PEN NEEDLES) 31G X 5 MM MISC once daily.  . Iron-Vitamin C (VITRON-C) 65-125 MG TABS Take 1 tablet by mouth daily.   [name] levothyroxine (SYNTHROID, LEVOTHROID) 100 MCG tablet Take 100 mcg by mouth daily.  [name] lisinopril (PRINIVIL,ZESTRIL) 10 MG tablet Take 10 mg by mouth daily.   . Magnesium 500 MG TABS Take 500 mg by mouth daily.   . magnesium gluconate (MAGONATE) 500 MG tablet Take by mouth.  . metFORMIN (GLUCOPHAGE) 1000 MG tablet Take 1,000 mg by mouth 2 (two) times daily.  . Multiple Vitamin (MULTI-VITAMIN) tablet Take by mouth.  . Multiple Vitamin (MULTIVITAMIN WITH MINERALS) TABS tablet Take 1 tablet by mouth daily.  . mupirocin ointment (BACTROBAN) 2 % APPLY TO AFFECTED AREA 3 TIMES A DAY  . Omega-3 Fatty Acids (FISH OIL) 1000 MG CAPS Take 1,000 mg by mouth daily.  . pantoprazole (PROTONIX) 40 MG tablet Take 40 mg by mouth daily.   . ranitidine (ZANTAC) 300 MG capsule Take 300 mg by mouth at bedtime.  . rosuvastatin (CRESTOR) 20 MG tablet Take 20 mg by mouth daily.  . sitaGLIPtin-metformin (JANUMET) 50-1000 MG tablet Take by mouth.  . TRESIBA FLEXTOUCH 200 UNIT/ML SOPN INJECT 30 UNITS SUB-Q NIGHTLY  . VASCEPA 1 g CAPS Take 1 capsule by mouth 2 (two) times daily.  . vitamin B-12 (CYANOCOBALAMIN) 1000 MCG tablet Take 1,000 mcg by mouth daily.  . vitamin E 400 UNIT capsule Take 400 Units by mouth daily.  [name] zolpidem (AMBIEN) 5 MG tablet Take 1 tablet (5 mg total) by mouth at bedtime.  [name] buPROPion (WELLBUTRIN XL) 150 MG 24 hr tablet Take 150 mg by mouth daily. Take with 300 mg to equal 450 mg once daily  . Coral Calcium 133-66.7-133 MG-MG-UNIT CAPS Take by mouth.  . ranitidine (ZANTAC) 300 MG tablet Take 300 mg by mouth at bedtime.   Current Facility-Administered Medications (Other)  Medication Route  . Bevacizumab (AVASTIN) SOLN 1.25 mg Intravitreal  . Bevacizumab (AVASTIN) SOLN 1.25 mg Intravitreal  . Bevacizumab (AVASTIN) SOLN 1.25 mg Intravitreal  . Bevacizumab (AVASTIN) SOLN 1.25 mg Intravitreal  . insulin aspart (novoLOG) injection 0-15 Units Subcutaneous  . insulin aspart (novoLOG) injection 0-5 Units Subcutaneous      REVIEW OF SYSTEMS: ROS    Positive for: Endocrine, Eyes   Negative for: Constitutional, Gastrointestinal, Neurological, Skin, Genitourinary, Musculoskeletal, HENT, Cardiovascular,  Respiratory, Psychiatric, Allergic/Imm, Heme/Lymph   Last edited by Matthew Folks, COA on 10/16/2018  1:55 PM. (History)       ALLERGIES No Known Allergies  PAST MEDICAL HISTORY Past Medical History:  Diagnosis Date  . Anxiety   . Arthritis   . Cataracts, both eyes   . Diabetic retinopathy ([place])    NPDR OU  . GERD (gastroesophageal reflux disease)   . Gout   . History of fracture of patella    right knee  . History of positive PPD    Patient always shows positive  . Hyperlipidemia   . Hypertension   . Hypertensive retinopathy  OU  . Hypothyroidism   . Lichen sclerosus 77/41/2878   of vulva  . Metatarsal fracture   . Neuropathy   . Peripheral vascular disease ([place])   . Polyneuropathy    numbness and tingling in feet and toes  . Renal insufficiency   . Type 2 diabetes mellitus, uncontrolled ([place])    Past Surgical History:  Procedure Laterality Date  . APPENDECTOMY    . BREAST REDUCTION SURGERY  2001  . CATARACT EXTRACTION    . CESAREAN SECTION  1976  . COLONOSCOPY  03/05/2013   Nml - due for repeat 03/06/2018  . DILATION AND CURETTAGE OF UTERUS  1989  . ENDARTERECTOMY FEMORAL Bilateral 03/09/2018   Procedure: ENDARTERECTOMY FEMORAL;  Surgeon: Algernon Huxley, MD;  Location: [facility];  Service: Vascular;  Laterality: Bilateral;  . ENDARTERECTOMY POPLITEAL Left 03/09/2018   Procedure: ENDARTERECTOMY POPLITEAL AND SFA;  Surgeon: Algernon Huxley, MD;  Location: [facility];  Service: Vascular;  Laterality: Left;  . ESOPHAGOGASTRODUODENOSCOPY  03/05/2013  . EYE SURGERY    . Eyelid Surgery  2012  . INTRAMEDULLARY (IM) NAIL INTERTROCHANTERIC Left 10/30/2015   Procedure: INTRAMEDULLARY (IM) NAIL INTERTROCHANTRIC ;  Surgeon: Hessie Knows, MD;  Location: [facility] OR[facility];  Service: Orthopedics;  Laterality: Left;  . LAPAROSCOPIC HYSTERECTOMY  2000   total  . LOWER EXTREMITY ANGIOGRAPHY Left 03/08/2017   Procedure: LOWER EXTREMITY ANGIOGRAPHY;  Surgeon: Algernon Huxley, MD;  Location: [place] [facility];  Service: Cardiovascular;  Laterality: Left;  . LOWER EXTREMITY ANGIOGRAPHY Left 10/30/2017   Procedure: LOWER EXTREMITY ANGIOGRAPHY;  Surgeon: Algernon Huxley, MD;  Location: [place] [facility];  Service: Cardiovascular;  Laterality: Left;  . LOWER EXTREMITY ANGIOGRAPHY Right 03/08/2018   Procedure: LOWER EXTREMITY ANGIOGRAPHY;  Surgeon: Algernon Huxley, MD;  Location: [place] [facility];  Service: Cardiovascular;  Laterality: Right;  . LOWER EXTREMITY ANGIOGRAPHY Left 10/01/2018   Procedure: LOWER EXTREMITY ANGIOGRAPHY;  Surgeon: Algernon Huxley, MD;  Location: [place] [facility];  Service: Cardiovascular;  Laterality: Left;  . LOWER EXTREMITY ANGIOGRAPHY Right 10/08/2018   Procedure: LOWER EXTREMITY ANGIOGRAPHY;  Surgeon: Algernon Huxley, MD;  Location: [place] [facility];  Service: Cardiovascular;  Laterality: Right;  . PERIPHERAL VASCULAR INTERVENTION  03/08/2018   Procedure: PERIPHERAL VASCULAR INTERVENTION;  Surgeon: Algernon Huxley, MD;  Location: [place] [facility];  Service: Cardiovascular;;  . REDUCTION MAMMAPLASTY  1997    FAMILY HISTORY Family History  Problem Relation Age of Onset  . Coronary artery disease Father   . Heart attack Father   . Coronary artery disease Mother   . Heart attack Mother   . Ovarian cancer Sister 10       sister had hormonal therapy for IVF txs-which increased risk factor for ovarian cancer  . Breast cancer Neg Hx     SOCIAL HISTORY Social History   Tobacco Use  . Smoking status: Former Smoker    Packs/day: 1.00    Years: 20.00    Pack years: 20.00    Types: Cigarettes    Quit date: 03/07/1996    Years since quitting: 22.6  . Smokeless tobacco: Never Used  . Tobacco comment: started smoking at age 72  Substance Use Topics  . Alcohol use: No    Alcohol/week: 0.0 standard drinks  . Drug use: No         OPHTHALMIC EXAM:  Base Eye Exam    Visual Acuity (Snellen - Linear)  Right Left   Dist [place] 20/30 -2 20/20 -   Dist ph  [place] NI        Tonometry (Tonopen, 2:01 PM)      Right Left   Pressure 16 16       Pupils      Dark Light Shape React APD   Right 2 1 Round Brisk None   Left 2 1 Round Brisk None       Visual Fields (Counting fingers)      Left Right    Full Full       Extraocular Movement      Right Left    Full, Ortho Full, Ortho       Neuro/Psych    Oriented x3: Yes   Mood/Affect: Normal       Dilation    Both eyes: 1.0% Mydriacyl, 2.5% Phenylephrine @ 2:01 PM        Slit Lamp and Fundus Exam    External Exam      Right Left   External Normal Normal       Slit Lamp Exam      Right Left   Lids/Lashes dermatochalasis dermatochalasis   Conjunctiva/Sclera White and quiet White and quiet   Cornea arcus; well healed cataract wound; 1+ Punctate epithelial erosions, decreased TBUT, mild Anterior basement membrane dystrophy superiorly arcus; well healed cataract wound, 1+ Punctate epithelial erosions, irregualr epi surface, decreased TBUT   Anterior Chamber Deep and quiet Deep and quiet   Iris Round and dilated Round and dilated   Lens PCIOL; open PC PCIOL; open PC   Vitreous syneresis, Posterior vitreous detachment, vitreous condensations inferiorly syneresis, Posterior vitreous detachment       Fundus Exam      Right Left   Disc Superior hyperemia and mild edema, +IRH superior disc - persistent Pink and Sharp   C/D Ratio 0.4 0.5   Macula Flat, Blunted foveal reflex, mild interval increase in IRF, +Epiretinal membrane flat; no heme or edema, small pigment clump IT to fovea   Vessels Vascular attenuation, Tortuous Vascular attenuation   Periphery attached; 360 MAs/DBH inferior Attached, no heme          IMAGING AND PROCEDURES  Imaging and Procedures for 04/25/17  OCT, Retina - OU - Both Eyes       Right Eye Quality was good. Central Foveal Thickness: 372. Progression has worsened. Findings include abnormal foveal contour, epiretinal membrane, no SRF, intraretinal fluid  (mild increase in central IRF and cystic changes, +ERM).   Left Eye Quality was good. Central Foveal Thickness: 281. Progression has been stable. Findings include normal foveal contour, no IRF, no SRF (Trace ERM).   Notes *Images captured and stored on drive  Diagnosis / Impression:  OD: persistent, slightly improved IRF and cystic changes, ERM OS: NFP; no IRF/SRF--stable, trace ERM  Clinical management:  See below  Abbreviations: NFP - Normal foveal profile. CME - cystoid macular edema. PED - pigment epithelial detachment. IRF - intraretinal fluid. SRF - subretinal fluid. EZ - ellipsoid zone. ERM - epiretinal membrane. ORA - outer retinal atrophy. ORT - outer retinal tubulation. SRHM - subretinal hyper-reflective material        Intravitreal Injection, Pharmacologic Agent - OD - Right Eye       Time Out 10/16/2018. 1:52 PM. Confirmed correct patient, procedure, site, and patient consented.   Anesthesia Topical anesthesia was used. Anesthetic medications included Lidocaine 2%, Proparacaine 0.5%.   Procedure Preparation included 5% betadine to ocular  surface, eyelid speculum. A 30 gauge needle was used.   Injection:  2 mg aflibercept Alfonse Flavors) SOLN   NDC: A3590391, Lot: [id number], Expiration date: 04/23/2019   Route: Intravitreal, Site: Right Eye, Waste: 0.05 mL  Post-op Post injection exam found visual acuity of at least counting fingers. The patient tolerated the procedure well. There were no complications. The patient received written and verbal post procedure care education.                 ASSESSMENT/PLAN:    ICD-10-CM   1. Branch retinal vein occlusion of right eye with macular edema  H34.8310 Intravitreal Injection, Pharmacologic Agent - OD - Right Eye    aflibercept (EYLEA) SOLN 2 mg  2. Retinal edema  H35.81 OCT, Retina - OU - Both Eyes  3. Both eyes affected by mild nonproliferative diabetic retinopathy with macular edema, associated with type 2  diabetes mellitus ([place])  W58.0998   4. Essential hypertension  I10   5. Hypertensive retinopathy of both eyes  H35.033   6. Epiretinal membrane (ERM) of right eye  H35.371   7. Pseudophakia of both eyes  Z96.1   8. 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 Floaters in visual field, right  H43.391     1,2. BRVO w/ CME OD  - by history, pt states symptoms first noticed 2 wks prior to presentation, but reports changes may have occurred prior  - initial exam with differential tortuosity of vessels (OD > OS)  - FA (02.10.20) shows mild late staining / leakage in macula, staining / leakage of disc -- improving CME  - differential includes DM2 (DME), hypertensive retinopathy, inflammatory etiology / uveitis  - S/P IVA OD #1 (02.08.19), #2 (03.11.19), #3 (04.09.19), #4 (05.20.19), #5 (02.10.20)  - review of OCTs show persistent IRF and cystic changes --  resistance to IVA   - June 2019 -- switched therapies: S/P IVE OD #1 (06.24.19), #2 (07.24.19), #3 (09.04.19), #4 (10.30.19),#5 (12.30.19), #6 (03.23.20), #7 (05.05.20), #8 (07.16.20), #9 (07.17.20), #10 (08.28.20)  - gave IVA OD on 2.10.20 due to pending Eylea4U for 2020 -- resulted in increased IRF/CME  - OCT today shows persistent, slightly increased IRF and cystic changes OD  - BCVA 20/30-2 OD today stable  - Eylea4U benefits investigation for 2020 now completed and pt approved for IVE  - recommend IVE OD #11 (10.13.20)  - RBA of procedure discussed, questions answered  - informed consent obtained and signed  - see procedure note  - F/U 5 weeks  -- DFE/OCT/possible injection  3. Mild nonproliferative diabetic retinopathy, both eyes  - The incidence, risk factors for progression, natural history and treatment options for diabetic retinopathy were discussed with patient.    - The need for close monitoring of blood glucose, blood pressure, and serum lipids, avoiding cigarette or any type of tobacco, and the need for long term follow up was  also discussed with patient.  - could be contributing to CME OD  - OS with minimal diabetic retinopathy  - continue to monitor  4,5. Hypertensive retinopathy OU - stable - as above, may have contributing to CME OD - discussed importance of tight BP control - monitor  6. Epiretinal membrane, right eye   - The natural history, anatomy, potential for loss of vision, and treatment options including vitrectomy techniques and the complications of endophthalmitis, retinal detachment, vitreous hemorrhage, cataract progression and permanent vision loss discussed with the patient.  - stable nasal ERM  -  no indication for surgery at this time  7. Pseudophakia OU  - s/p CE/IOL OU by cataract surgeon in [facility]  - doing well  - monitor  8. Optic disc edema OD -- sectoral  - likely secondary to BRVO but differential includes carotid stenosis and retro-orbital mass  - history of blood clots  - recommend CT orbits w/ contrast to r/o retro-orbital mass -- not obtained  - recommend carotid dopplers to r/o stenosis / occlusion -- pt scheduled for repeat u/s due to recent bilateral femoral endarterectomies -- strong history of peripheral vascular disease   Ophthalmic Meds Ordered this visit:  Meds ordered this encounter  Medications  . aflibercept (EYLEA) SOLN 2 mg       Return 5 weeks, for DFE, OCT.  There are no Patient Instructions on file for this visit.   Explained the diagnoses, plan, and follow up with the patient and they expressed understanding.  Patient expressed understanding of the importance of proper follow up care.   Electronically signed by: Estill Bakes, COT 10/08/18 1:30 p.m.  Gardiner Sleeper, M.D., Ph.D. Diseases & Surgery of the Retina and Vitreous [facility] 10/16/18  I have reviewed the above documentation for accuracy and completeness, and I agree with the above. Gardiner Sleeper, M.D., Ph.D. 10/16/18 8:19 PM    Abbreviations: M myopia  (nearsighted); A astigmatism; H hyperopia (farsighted); P presbyopia; Mrx spectacle prescription;  CTL contact lenses; OD right eye; OS left eye; OU both eyes  XT exotropia; ET esotropia; PEK punctate epithelial keratitis; PEE punctate epithelial erosions; DES dry eye syndrome; MGD meibomian gland dysfunction; ATs artificial tears; PFAT's preservative free artificial tears; [place] nuclear sclerotic cataract; PSC posterior subcapsular cataract; ERM epi-retinal membrane; PVD posterior vitreous detachment; RD retinal detachment; DM diabetes mellitus; DR diabetic retinopathy; NPDR non-proliferative diabetic retinopathy; PDR proliferative diabetic retinopathy; CSME clinically significant macular edema; DME diabetic macular edema; dbh dot blot hemorrhages; CWS cotton wool spot; POAG primary open angle glaucoma; C/D cup-to-disc ratio; HVF humphrey visual field; GVF goldmann visual field; OCT optical coherence tomography; IOP intraocular pressure; BRVO Branch retinal vein occlusion; CRVO central retinal vein occlusion; CRAO central retinal artery occlusion; BRAO branch retinal artery occlusion; RT retinal tear; SB scleral buckle; PPV pars plana vitrectomy; VH Vitreous hemorrhage; PRP panretinal laser photocoagulation; IVK intravitreal kenalog; VMT vitreomacular traction; MH Macular hole;  NVD neovascularization of the disc; NVE neovascularization elsewhere; AREDS age related eye disease study; ARMD age related macular degeneration; POAG primary open angle glaucoma; EBMD epithelial/anterior basement membrane dystrophy; ACIOL anterior chamber intraocular lens; IOL intraocular lens; PCIOL posterior chamber intraocular lens; Phaco/IOL phacoemulsification with intraocular lens placement; [place] photorefractive keratectomy; LASIK laser assisted in situ keratomileusis; HTN hypertension; DM diabetes mellitus; COPD chronic obstructive pulmonary disease

## 2018-10-08 NOTE — Progress Notes (Signed)
Made MD aware of Cre and GFR levels resulted now. Addressed in Vascular now for procedure.

## 2018-10-16 ENCOUNTER — Ambulatory Visit (INDEPENDENT_AMBULATORY_CARE_PROVIDER_SITE_OTHER): Payer: Medicare Other | Admitting: Ophthalmology

## 2018-10-16 ENCOUNTER — Encounter (INDEPENDENT_AMBULATORY_CARE_PROVIDER_SITE_OTHER): Payer: Self-pay | Admitting: Ophthalmology

## 2018-10-16 ENCOUNTER — Other Ambulatory Visit: Payer: Self-pay

## 2018-10-16 DIAGNOSIS — H4710 Unspecified papilledema: Secondary | ICD-10-CM

## 2018-10-16 DIAGNOSIS — H35033 Hypertensive retinopathy, bilateral: Secondary | ICD-10-CM

## 2018-10-16 DIAGNOSIS — E113213 Type 2 diabetes mellitus with mild nonproliferative diabetic retinopathy with macular edema, bilateral: Secondary | ICD-10-CM

## 2018-10-16 DIAGNOSIS — I10 Essential (primary) hypertension: Secondary | ICD-10-CM | POA: Diagnosis not present

## 2018-10-16 DIAGNOSIS — H3581 Retinal edema: Secondary | ICD-10-CM

## 2018-10-16 DIAGNOSIS — H34831 Tributary (branch) retinal vein occlusion, right eye, with macular edema: Secondary | ICD-10-CM

## 2018-10-16 DIAGNOSIS — H35371 Puckering of macula, right eye: Secondary | ICD-10-CM

## 2018-10-16 DIAGNOSIS — Z961 Presence of intraocular lens: Secondary | ICD-10-CM

## 2018-10-16 DIAGNOSIS — H43391 Other vitreous opacities, right eye: Secondary | ICD-10-CM

## 2018-10-16 MED ORDER — AFLIBERCEPT 2MG/0.05ML IZ SOLN FOR KALEIDOSCOPE
2.0000 mg | INTRAVITREAL | Status: AC | PRN
  Administered 2018-10-16: 2 mg via INTRAVITREAL

## 2018-10-19 ENCOUNTER — Other Ambulatory Visit: Payer: Self-pay | Admitting: Orthopedic Surgery

## 2018-10-19 DIAGNOSIS — S32040A Wedge compression fracture of fourth lumbar vertebra, initial encounter for closed fracture: Secondary | ICD-10-CM

## 2018-10-19 DIAGNOSIS — S32030A Wedge compression fracture of third lumbar vertebra, initial encounter for closed fracture: Secondary | ICD-10-CM

## 2018-10-21 ENCOUNTER — Ambulatory Visit
Admission: RE | Admit: 2018-10-21 | Discharge: 2018-10-21 | Disposition: A | Discharge status: Home / Self Care | Payer: Medicare Other | Source: Ambulatory Visit | Attending: Orthopedic Surgery | Admitting: Orthopedic Surgery

## 2018-10-21 ENCOUNTER — Other Ambulatory Visit: Payer: Self-pay

## 2018-10-21 DIAGNOSIS — S32030A Wedge compression fracture of third lumbar vertebra, initial encounter for closed fracture: Secondary | ICD-10-CM | POA: Diagnosis present

## 2018-10-21 DIAGNOSIS — S32040A Wedge compression fracture of fourth lumbar vertebra, initial encounter for closed fracture: Secondary | ICD-10-CM

## 2018-10-21 IMAGING — MR MR LUMBAR SPINE W/O CM
5 series · 31 of 48 positions shown · non-contrast
Comparison: None.

CLINICAL DATA: Fell 6 weeks ago with low back pain.

EXAM:
MRI LUMBAR SPINE WITHOUT CONTRAST
TECHNIQUE: Multiplanar, multisequence MR imaging of the lumbar spine was
performed. No intravenous contrast was administered.

[Series 5: T2 · sagittal · 4.0mm · 0.81mm/px · 7 of 17 slices shown (1 of 2)]
[im 1/17]
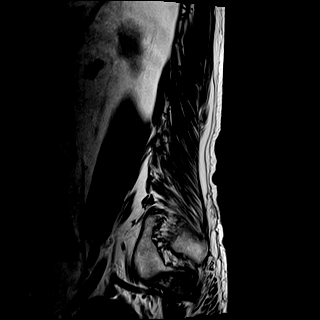
[im 3/17]
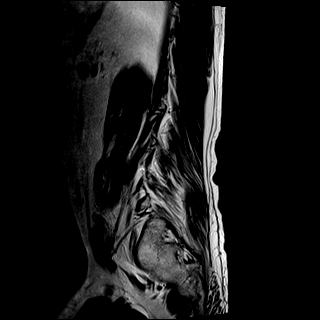
[im 6/17]
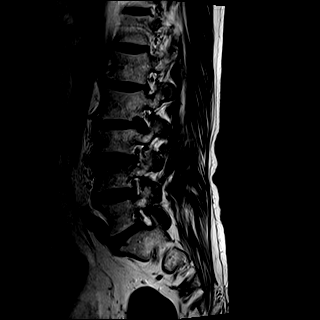
[im 9/17]
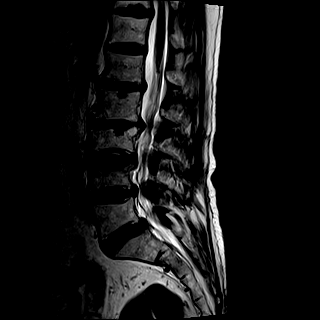
[im 11/17]
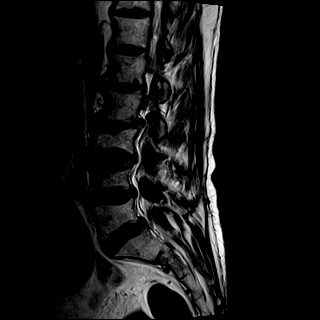
[im 14/17]
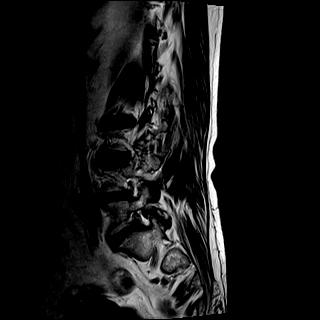
[im 17/17]
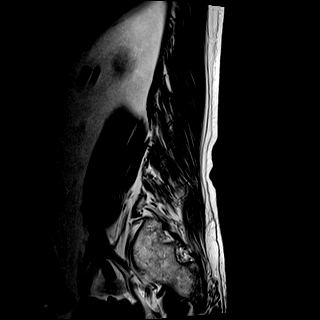

[Series 6: T1 · sagittal · 4.0mm · 0.81mm/px · 7 of 17 slices shown (1 of 2)]
[im 1/17]
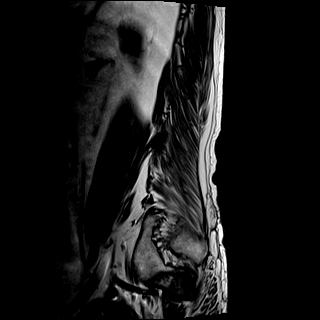
[im 3/17]
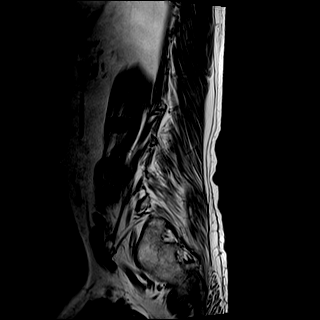
[im 6/17]
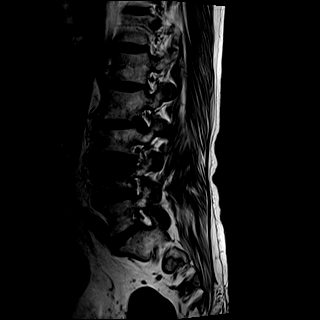
[im 9/17]
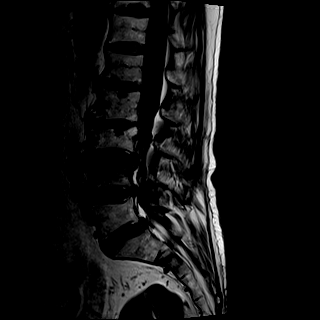
[im 11/17]
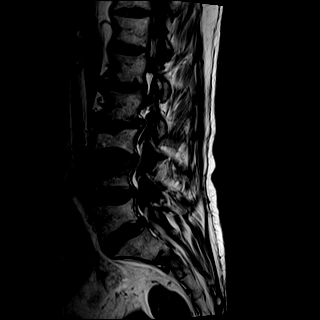
[im 14/17]
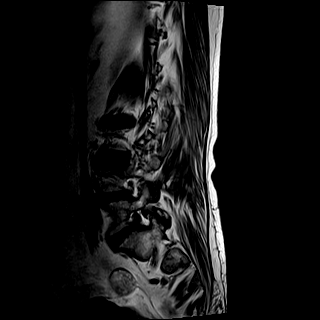
[im 17/17]
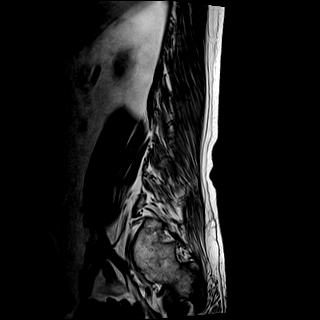

[Series 7: STIR · sagittal · 4.0mm · 0.41mm/px · 1 of 17 slices shown]
[im 1/17]
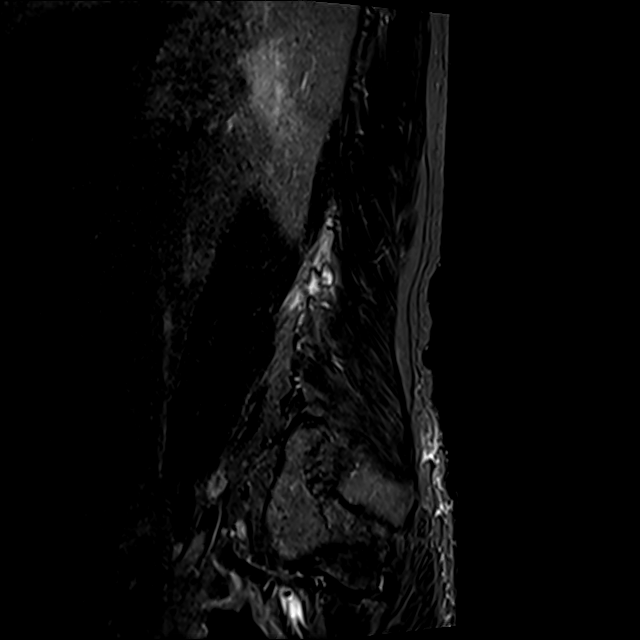

[Series 8: T2 · axial · 4.0mm · 0.78mm/px · z∈[-133,+71]mm · 8 of 38 slices shown (2 of 2)]
[im 1/38]
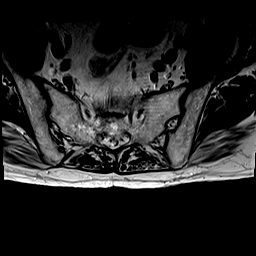
[im 6/38]
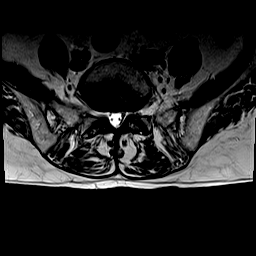
[im 12/38]
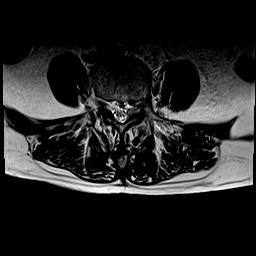
[im 18/38]
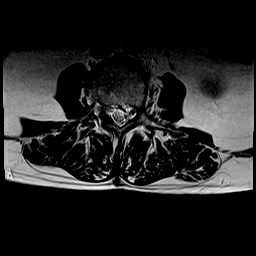
[im 20/38]
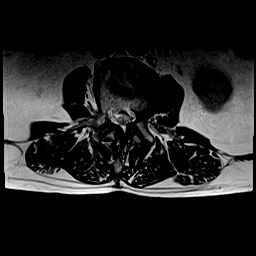
[im 26/38]
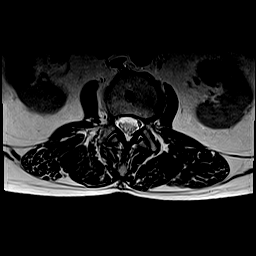
[im 32/38]
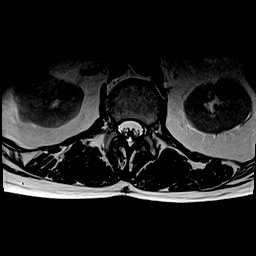
[im 38/38]
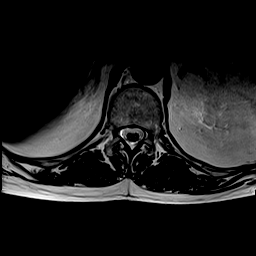

[Series 9: T1 · axial · 4.0mm · 0.39mm/px · z∈[-133,+71]mm · 8 of 38 slices shown (2 of 2)]
[im 1/38]
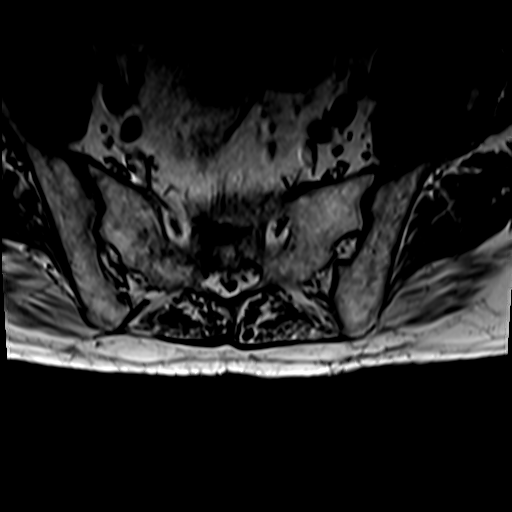
[im 6/38]
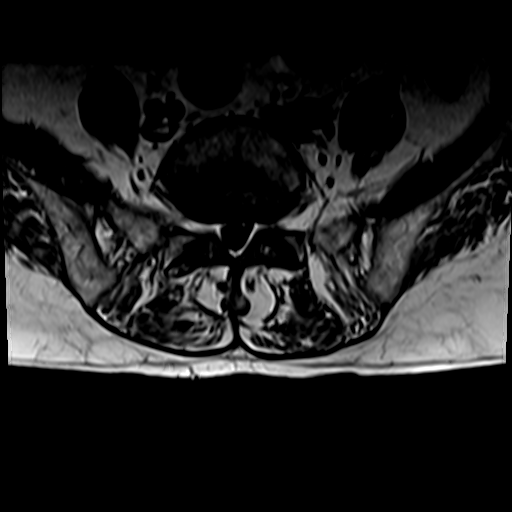
[im 12/38]
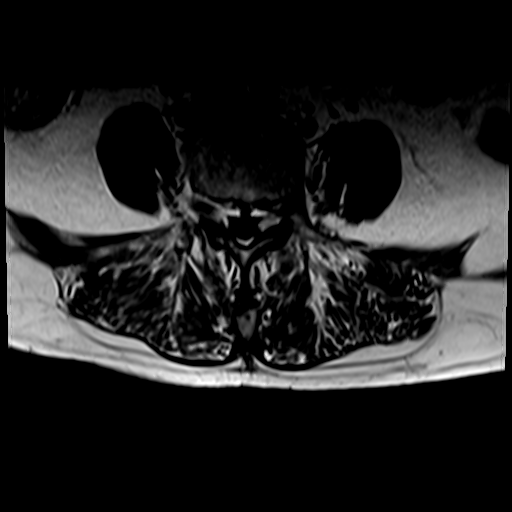
[im 18/38]
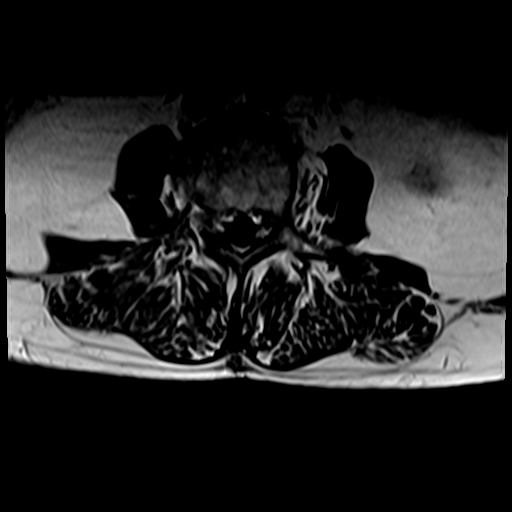
[im 20/38]
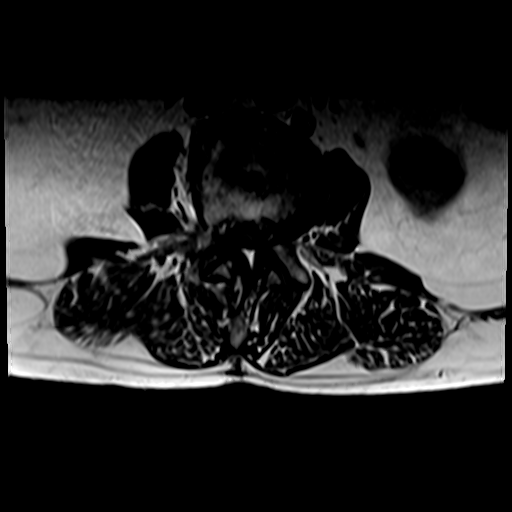
[im 26/38]
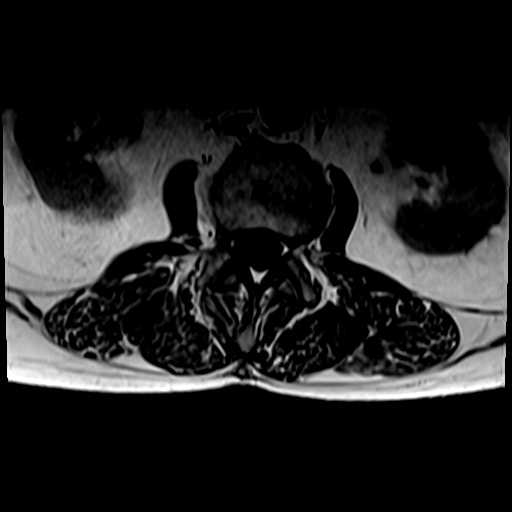
[im 32/38]
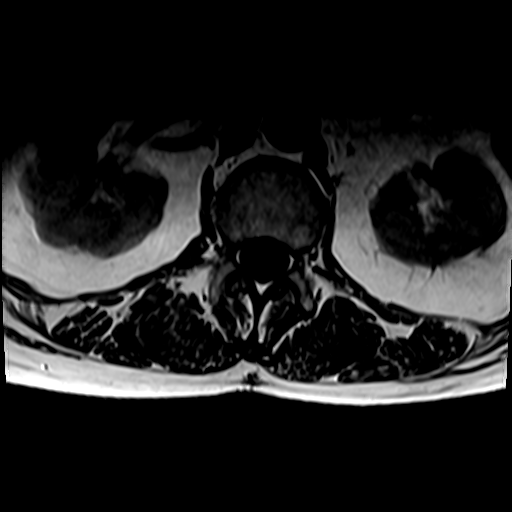
[im 38/38]
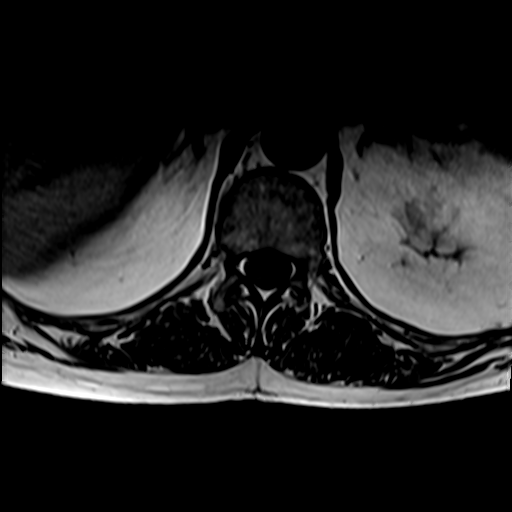

[31 of 48 positions shown; findings below may reference images not displayed]

FINDINGS: Segmentation:  5 lumbar type vertebral bodies.

Alignment:  Straightening of the normal lumbar lordosis.

Vertebrae: Acute/subacute superior endplate fracture at L4 with loss
of height of 25%. Acute sacral insufficiency type fracture at the S2
level involving both sacral ala. Old healed compression deformity at
L3 with loss of height of 40%. These look like benign fractures.

Conus medullaris and cauda equina: Conus extends to the L1 level.
Conus and cauda equina appear normal.

Paraspinal and other soft tissues: Negative

Disc levels:

T12-L1: Normal.

L1-2: Mild noncompressive disc bulge.

L2-3: Moderate disc bulge. Posterior bowing of the posterosuperior
margin of the L3 vertebral body related to the old fracture. Facet
and ligamentous hypertrophy. Moderate stenosis at this level that
could be symptomatic.

L3-4: Bulging of the disc. No retropulsed bone. Mild facet and
ligamentous hypertrophy. Mild stenosis without visible neural
compression.

L4-5: Bulging of the disc. Facet and ligamentous hypertrophy.
Moderate lateral recess stenosis left more than right that could
possibly be symptomatic.

L5-S1: Bulging of the disc. Facet degeneration. No compressive
stenosis.
IMPRESSION: Acute/subacute compression fracture at the superior endplate of L4
with loss of height of 40%. Minimal posterior bowing but no frank
retropulsion. Acute or subacute fracture of the sacrum at the S2
level involving both sacral ala. These all look like benign
fractures.

Old healed benign appearing fracture at L3.

Moderate multifactorial spinal stenosis at L2-3 that could be
symptomatic. Lateral recess narrowing at L4-5 left more than right
that could be symptomatic.

## 2018-10-22 ENCOUNTER — Other Ambulatory Visit: Payer: Self-pay | Admitting: Orthopedic Surgery

## 2018-10-24 ENCOUNTER — Encounter
Admission: RE | Admit: 2018-10-24 | Discharge: 2018-10-24 | Disposition: A | Discharge status: Home / Self Care | Payer: Medicare Other | Source: Ambulatory Visit | Attending: Orthopedic Surgery | Admitting: Orthopedic Surgery

## 2018-10-24 ENCOUNTER — Other Ambulatory Visit: Payer: Self-pay | Admitting: Orthopedic Surgery

## 2018-10-24 ENCOUNTER — Other Ambulatory Visit: Payer: Self-pay

## 2018-10-24 DIAGNOSIS — Z01818 Encounter for other preprocedural examination: Secondary | ICD-10-CM | POA: Diagnosis not present

## 2018-10-24 DIAGNOSIS — Z20828 Contact with and (suspected) exposure to other viral communicable diseases: Secondary | ICD-10-CM | POA: Insufficient documentation

## 2018-10-24 HISTORY — DX: Headache, unspecified: R51.9

## 2018-10-24 HISTORY — DX: Anemia, unspecified: D64.9

## 2018-10-24 HISTORY — DX: Sleep apnea, unspecified: G47.30

## 2018-10-24 LAB — CBC
HCT: 25.8 % — ABNORMAL LOW (ref 36.0–46.0)
Hemoglobin: 7.9 g/dL — ABNORMAL LOW (ref 12.0–15.0)
MCH: 26.6 pg (ref 26.0–34.0)
MCHC: 30.6 g/dL (ref 30.0–36.0)
MCV: 86.9 fL (ref 80.0–100.0)
Platelets: 399 10*3/uL (ref 150–400)
RBC: 2.97 MIL/uL — ABNORMAL LOW (ref 3.87–5.11)
RDW: 14.5 % (ref 11.5–15.5)
WBC: 8.4 10*3/uL (ref 4.0–10.5)
nRBC: 0 % (ref 0.0–0.2)

## 2018-10-24 LAB — SARS CORONAVIRUS 2 (TAT 6-24 HRS): SARS Coronavirus 2: NEGATIVE

## 2018-10-24 MED ORDER — CEFAZOLIN SODIUM-DEXTROSE 2-4 GM/100ML-% IV SOLN
2.0000 g | INTRAVENOUS | Status: AC
  Administered 2018-10-25: 2 g via INTRAVENOUS

## 2018-10-24 NOTE — Patient Instructions (Signed)
Your procedure is scheduled on: 10-25-18 THURSDAY Report to Same Day Surgery 2nd floor medical mall [facility] Entrance-take elevator on left to 2nd floor.  Check in with surgery information desk.) To find out your arrival time please call [phone] between 1PM - 3PM on 10-24-18 [facility]  Remember: Instructions that are not followed completely may result in serious medical risk, up to and including death, or upon the discretion of your surgeon and anesthesiologist your surgery may need to be rescheduled.    _x___ 1. Do not eat food after midnight the night before your procedure. NO GUM OR CANDY AFTER MIDNIGHT. You may drink WATER up to 2 hours before you are scheduled to arrive at the hospital for your procedure.  Do not drink WATER within 2 hours of your scheduled arrival to the hospital.  Type 1 and type 2 diabetics should only drink water.   ____Ensure clear carbohydrate drink on the way to the hospital for bariatric patients  ____Ensure clear carbohydrate drink 3 hours before surgery.    __x__ 2. No Alcohol for 24 hours before or after surgery.   __x__3. No Smoking or e-cigarettes for 24 prior to surgery.  Do not use any chewable tobacco products for at least 6 hour prior to surgery   ____  4. Bring all medications with you on the day of surgery if instructed.    __x__ 5. Notify your doctor if there is any change in your medical condition     (cold, fever, infections).    x___6. On the morning of surgery brush your teeth with toothpaste and water.  You may rinse your mouth with mouth wash if you wish.  Do not swallow any toothpaste or mouthwash.   Do not wear jewelry, make-up, hairpins, clips or nail polish.  Do not wear lotions, powders, or perfumes.   Do not shave 48 hours prior to surgery. Men may shave face and neck.  Do not bring valuables to the hospital.    [facility] is not responsible for any belongings or valuables.               Contacts, dentures or  bridgework may not be worn into surgery.  Leave your suitcase in the car. After surgery it may be brought to your room.  For patients admitted to the hospital, discharge time is determined by your treatment team.  _  Patients discharged the day of surgery will not be allowed to drive home.  You will need someone to drive you home and stay with you the night of your procedure.    Please read over the following fact sheets that you were given:   [facility] Preparing for Surgery   _x___ TAKE THE FOLLOWING MEDICATION THE MORNING OF SURGERY WITH A SMALL SIP OF WATER. These include:  1. BUPROPION [facility])  2. BYSTOLIC  3. CYMBALTA (DULOXETINE)  4. PEPCID (FAMOTIDINE)  5. SYNTHROID (LEVOTHYROXINE)  6. MAGNESIUM  7.PROTONIX (PANTOPRAZOLE)  8.CRESTOR (ROSUVASTATIN)  9.  YOU MAY TAKE XANAX (ALPROAZOLAM) THE MORNING OF SURGERY IF NEEDED  ____Fleets enema or Magnesium Citrate as directed.   _x___ Use CHG Soap or sage wipes as directed on instruction sheet   ____ Use inhalers on the day of surgery and bring to hospital day of surgery  _X___ Stop Metformin 2 days prior to surgery-STOP NOW-THE [place]    _X___ Take 1/2 of usual insulin dose the night before surgery and none on the  morning surgery-TAKE HALF OF YOUR TRESIBA (15 UNITS) AND NO INSULIN THE MORNING OF SURGERY  _x___ Follow recommendations from Cardiologist, Pulmonologist or PCP regarding stopping Aspirin, Coumadin, Plavix ,Eliquis, Effient, or Pradaxa, and Pletal-DO NOT TAKE ASPIRIN AM OF SURGERY-PT WAS INSTRUCTED BY DR MENZ TO STOP ELIQUIS-LAST DOSE 10-23-18  X____Stop Anti-inflammatories such as Advil, Aleve, Ibuprofen, Motrin, Naproxen, Naprosyn, Goodies powders or aspirin products NOW-OK to take Tylenol   _x___ Stop supplements until after surgery-STOP VASCEPA AND VITAMIN E NOW-MAY RESUME AFTER SURGERY   ____ Bring C-Pap to the hospital.

## 2018-10-25 ENCOUNTER — Ambulatory Visit
Admission: RE | Admit: 2018-10-25 | Discharge: 2018-10-25 | Disposition: A | Discharge status: Home / Self Care | Payer: Medicare Other | Attending: Orthopedic Surgery | Admitting: Orthopedic Surgery

## 2018-10-25 ENCOUNTER — Ambulatory Visit: Payer: Medicare Other | Admitting: Certified Registered"

## 2018-10-25 ENCOUNTER — Ambulatory Visit: Payer: Medicare Other

## 2018-10-25 ENCOUNTER — Encounter: Payer: Self-pay | Admitting: *Deleted

## 2018-10-25 ENCOUNTER — Encounter
Admission: RE | Disposition: A | Discharge status: Home / Self Care | Payer: Self-pay | Source: Home / Self Care | Attending: Orthopedic Surgery

## 2018-10-25 ENCOUNTER — Other Ambulatory Visit: Payer: Self-pay

## 2018-10-25 DIAGNOSIS — Z79890 Hormone replacement therapy (postmenopausal): Secondary | ICD-10-CM | POA: Diagnosis not present

## 2018-10-25 DIAGNOSIS — E039 Hypothyroidism, unspecified: Secondary | ICD-10-CM | POA: Insufficient documentation

## 2018-10-25 DIAGNOSIS — D649 Anemia, unspecified: Secondary | ICD-10-CM | POA: Diagnosis not present

## 2018-10-25 DIAGNOSIS — I129 Hypertensive chronic kidney disease with stage 1 through stage 4 chronic kidney disease, or unspecified chronic kidney disease: Secondary | ICD-10-CM | POA: Insufficient documentation

## 2018-10-25 DIAGNOSIS — X58XXXA Exposure to other specified factors, initial encounter: Secondary | ICD-10-CM | POA: Diagnosis not present

## 2018-10-25 DIAGNOSIS — Z419 Encounter for procedure for purposes other than remedying health state, unspecified: Secondary | ICD-10-CM

## 2018-10-25 DIAGNOSIS — E1122 Type 2 diabetes mellitus with diabetic chronic kidney disease: Secondary | ICD-10-CM | POA: Insufficient documentation

## 2018-10-25 DIAGNOSIS — E785 Hyperlipidemia, unspecified: Secondary | ICD-10-CM | POA: Insufficient documentation

## 2018-10-25 DIAGNOSIS — K219 Gastro-esophageal reflux disease without esophagitis: Secondary | ICD-10-CM | POA: Insufficient documentation

## 2018-10-25 DIAGNOSIS — Z79899 Other long term (current) drug therapy: Secondary | ICD-10-CM | POA: Insufficient documentation

## 2018-10-25 DIAGNOSIS — S32040A Wedge compression fracture of fourth lumbar vertebra, initial encounter for closed fracture: Secondary | ICD-10-CM | POA: Insufficient documentation

## 2018-10-25 DIAGNOSIS — N183 Chronic kidney disease, stage 3 unspecified: Secondary | ICD-10-CM | POA: Diagnosis not present

## 2018-10-25 DIAGNOSIS — F419 Anxiety disorder, unspecified: Secondary | ICD-10-CM | POA: Insufficient documentation

## 2018-10-25 DIAGNOSIS — Z95820 Peripheral vascular angioplasty status with implants and grafts: Secondary | ICD-10-CM | POA: Diagnosis not present

## 2018-10-25 DIAGNOSIS — E1151 Type 2 diabetes mellitus with diabetic peripheral angiopathy without gangrene: Secondary | ICD-10-CM | POA: Diagnosis not present

## 2018-10-25 DIAGNOSIS — Z7982 Long term (current) use of aspirin: Secondary | ICD-10-CM | POA: Diagnosis not present

## 2018-10-25 DIAGNOSIS — M800AXA Age-related osteoporosis with current pathological fracture, other site, initial encounter for fracture: Secondary | ICD-10-CM | POA: Diagnosis not present

## 2018-10-25 DIAGNOSIS — Z794 Long term (current) use of insulin: Secondary | ICD-10-CM | POA: Diagnosis not present

## 2018-10-25 DIAGNOSIS — E1140 Type 2 diabetes mellitus with diabetic neuropathy, unspecified: Secondary | ICD-10-CM | POA: Insufficient documentation

## 2018-10-25 HISTORY — PX: KYPHOPLASTY: SHX5884

## 2018-10-25 HISTORY — PX: SACROPLASTY: SHX6797

## 2018-10-25 LAB — GLUCOSE, CAPILLARY
Glucose-Capillary: 159 mg/dL — ABNORMAL HIGH (ref 70–99)
Glucose-Capillary: 137 mg/dL — ABNORMAL HIGH (ref 70–99)

## 2018-10-25 IMAGING — RF DG C-ARM 1-60 MIN
1 series · 2 of 2 positions shown · non-contrast
Comparison: [DATE]

CLINICAL DATA: L4 kyphoplasty, sacroplasty

EXAM:
LUMBAR SPINE - 2-3 VIEW; DG C-ARM 1-60 MIN

[Series 1: dg x-ray · 0.14mm/px · 2 of 2 slices shown]
[im 1/2]
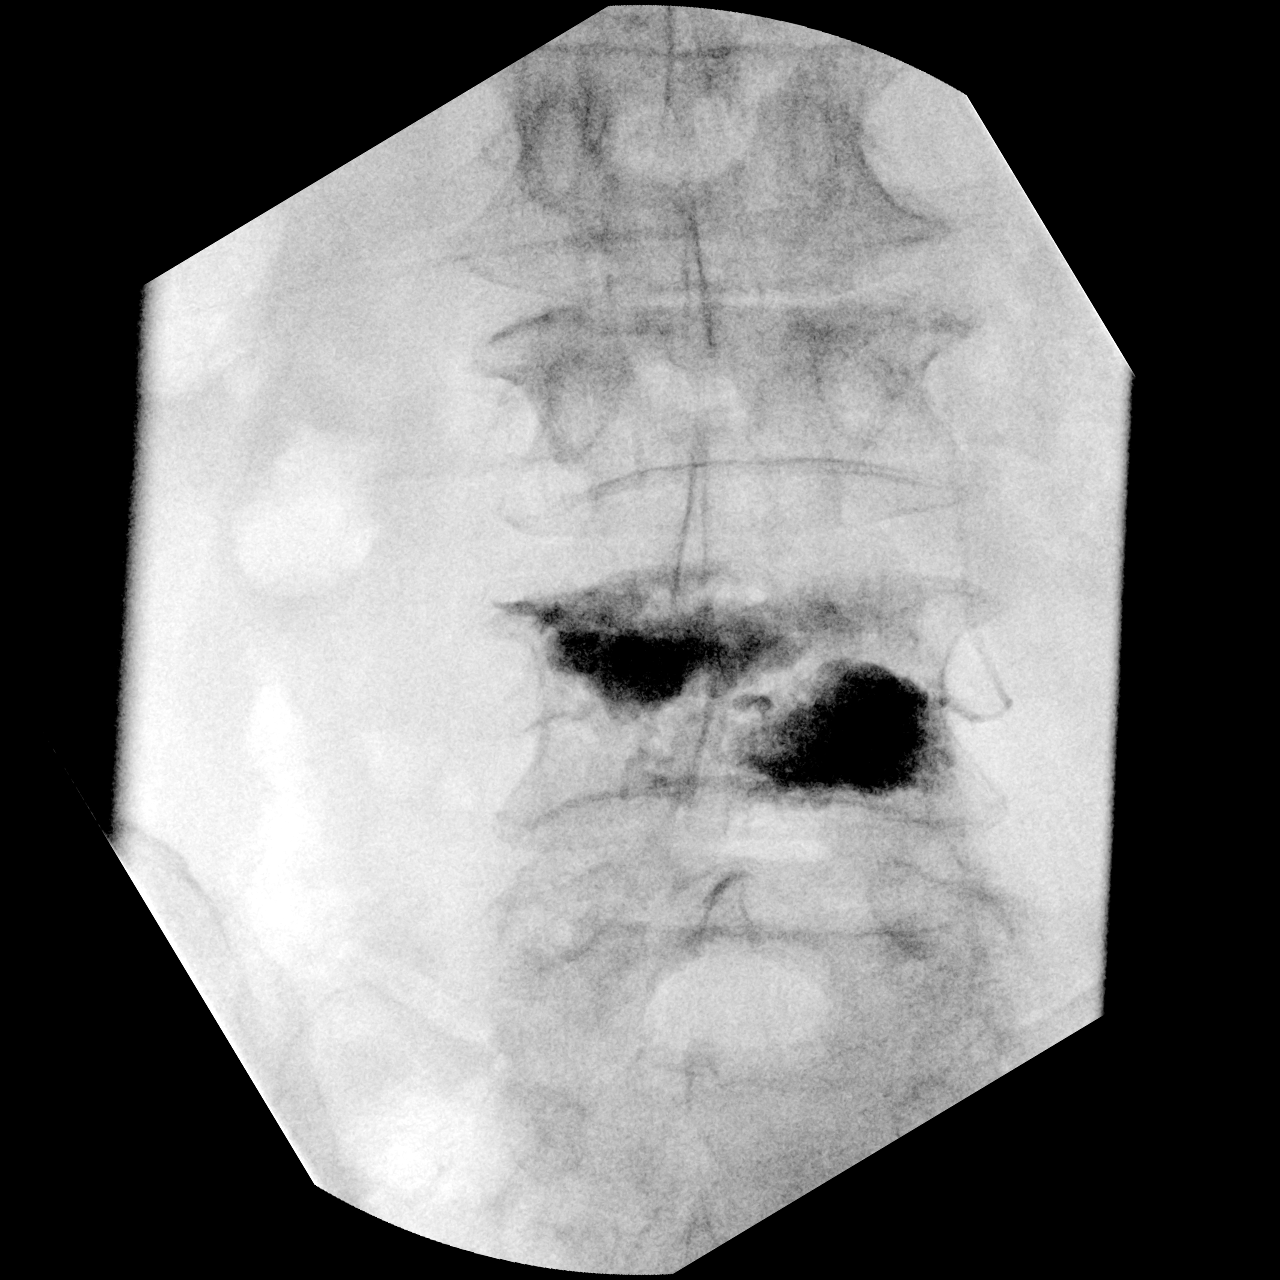
[im 2/2]
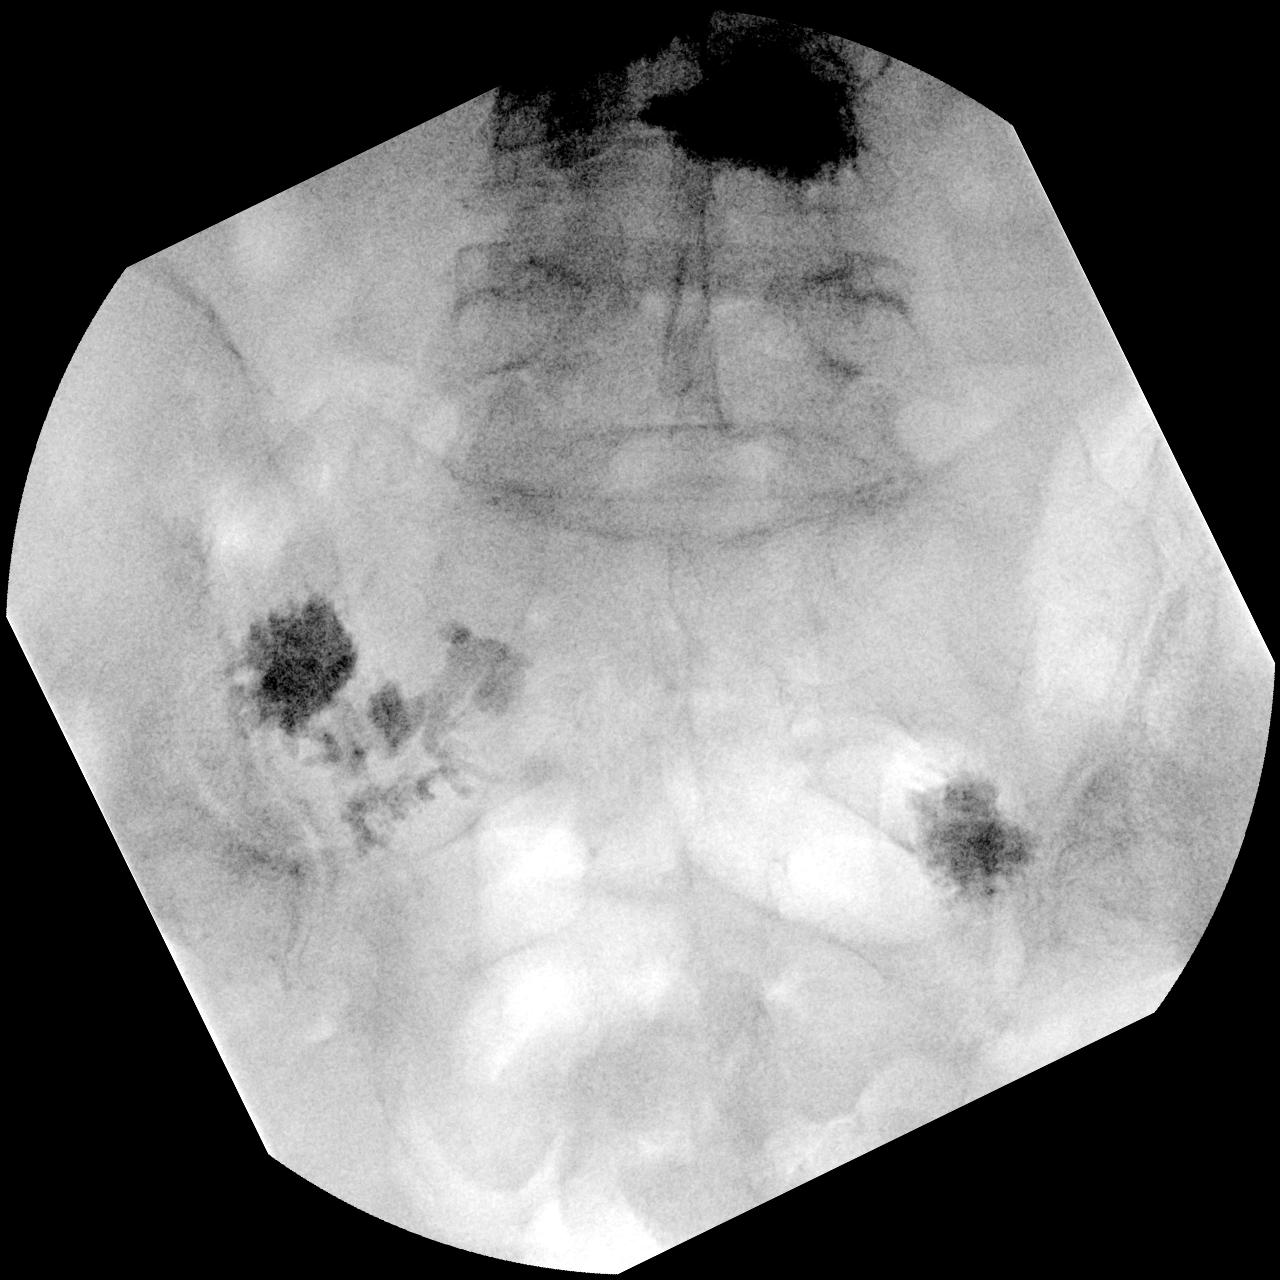

[2 of 2 positions shown; findings below may reference images not displayed]

FLUOROSCOPY TIME:  Radiation Exposure Index (as provided by the
fluoroscopic device): 37.5 mGy

If the device does not provide the exposure index:

Fluoroscopy Time:  1 minutes 38 seconds

Number of Acquired Images:  2
FINDINGS: Contrast laden cement is noted throughout the L4 vertebral body.
Additionally, contrast is noted within the sacrum bilaterally.
IMPRESSION: L4 kyphoplasty and bilateral sacroplasty

## 2018-10-25 IMAGING — XA DG LUMBAR SPINE 2-3V
1 series · 1 of 1 positions shown · non-contrast
Comparison: [DATE]

CLINICAL DATA: L4 kyphoplasty, sacroplasty

EXAM:
LUMBAR SPINE - 2-3 VIEW; DG C-ARM 1-60 MIN

[Series 2: ortho standard · 1 of 1 slices shown]
[im 1/1]
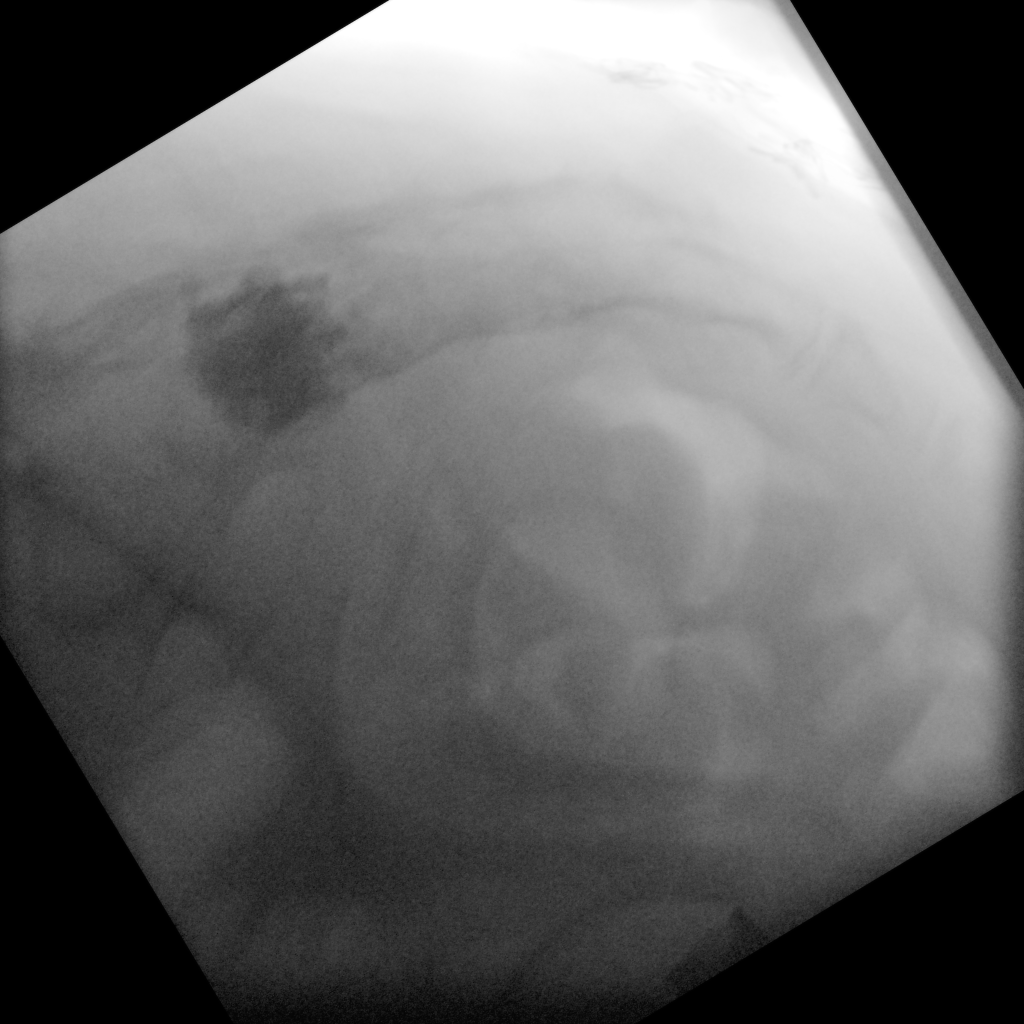

[1 of 1 positions shown; findings below may reference images not displayed]

FLUOROSCOPY TIME:  Radiation Exposure Index (as provided by the
fluoroscopic device): 37.5 mGy

If the device does not provide the exposure index:

Fluoroscopy Time:  1 minutes 38 seconds

Number of Acquired Images:  2
FINDINGS: Contrast laden cement is noted throughout the L4 vertebral body.
Additionally, contrast is noted within the sacrum bilaterally.
IMPRESSION: L4 kyphoplasty and bilateral sacroplasty

## 2018-10-25 IMAGING — RF DG LUMBAR SPINE 2-3V
1 series · 2 of 2 positions shown · non-contrast
Comparison: [DATE]

CLINICAL DATA: L4 kyphoplasty, sacroplasty

EXAM:
LUMBAR SPINE - 2-3 VIEW; DG C-ARM 1-60 MIN

[Series 1: dg x-ray · 0.14mm/px · 2 of 2 slices shown]
[im 1/2]
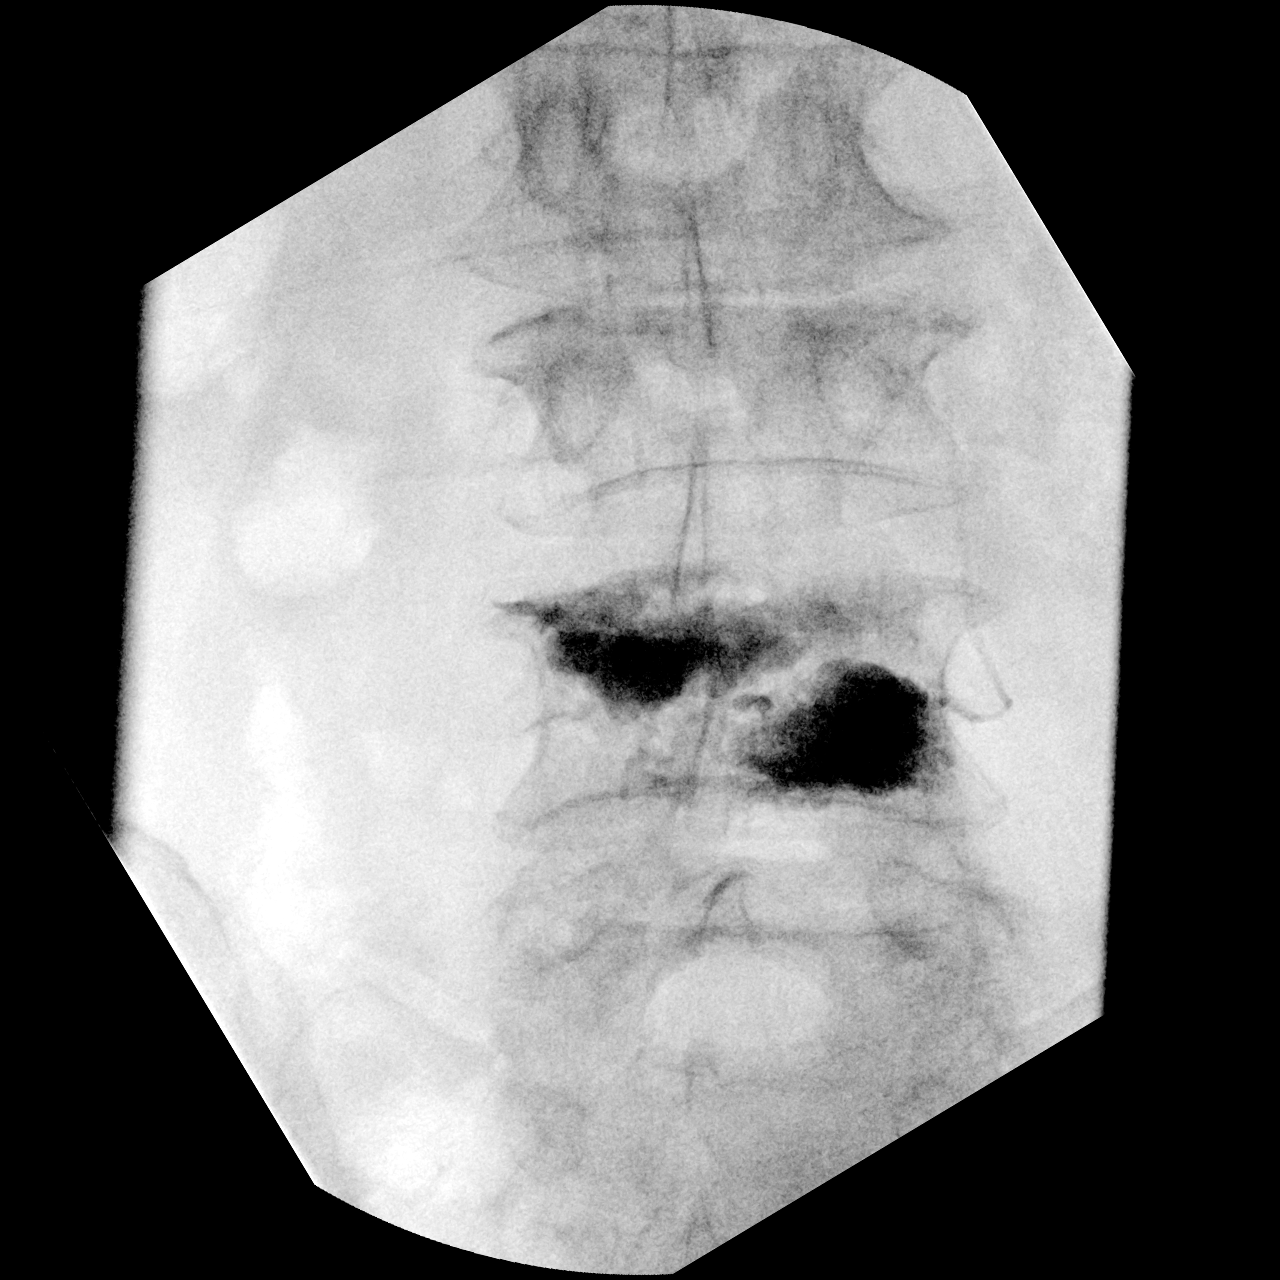
[im 2/2]
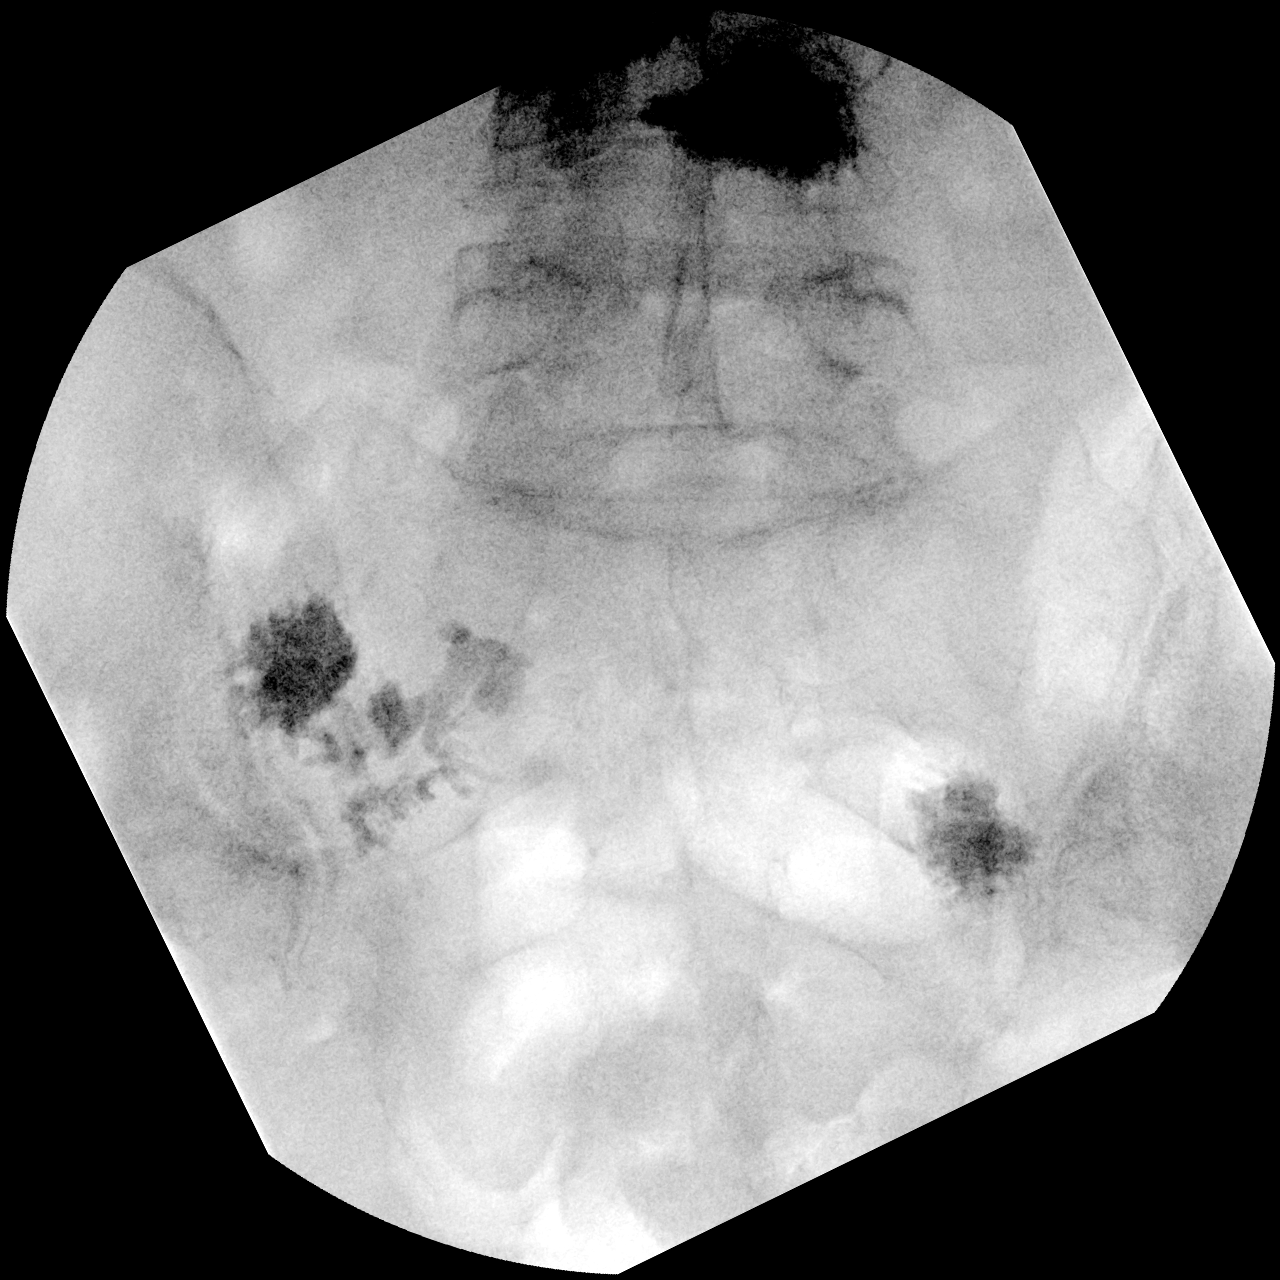

[2 of 2 positions shown; findings below may reference images not displayed]

FLUOROSCOPY TIME:  Radiation Exposure Index (as provided by the
fluoroscopic device): 37.5 mGy

If the device does not provide the exposure index:

Fluoroscopy Time:  1 minutes 38 seconds

Number of Acquired Images:  2
FINDINGS: Contrast laden cement is noted throughout the L4 vertebral body.
Additionally, contrast is noted within the sacrum bilaterally.
IMPRESSION: L4 kyphoplasty and bilateral sacroplasty

## 2018-10-25 SURGERY — KYPHOPLASTY
Anesthesia: General

## 2018-10-25 MED ORDER — DEXMEDETOMIDINE HCL IN NACL 80 MCG/20ML IV SOLN
INTRAVENOUS | Status: AC
  Filled 2018-10-25: qty 20

## 2018-10-25 MED ORDER — TRAMADOL HCL 50 MG PO TABS: 50.0000 mg | ORAL_TABLET | Freq: Four times a day (QID) | ORAL | 0 refills | Status: DC | PRN

## 2018-10-25 MED ORDER — PROPOFOL 500 MG/50ML IV EMUL
INTRAVENOUS | Status: DC | PRN
  Administered 2018-10-25: 75 ug/kg/min via INTRAVENOUS

## 2018-10-25 MED ORDER — ONDANSETRON HCL 4 MG/2ML IJ SOLN
INTRAMUSCULAR | Status: AC
  Filled 2018-10-25: qty 12

## 2018-10-25 MED ORDER — FENTANYL CITRATE (PF) 100 MCG/2ML IJ SOLN
INTRAMUSCULAR | Status: AC
  Filled 2018-10-25: qty 2

## 2018-10-25 MED ORDER — MIDAZOLAM HCL 2 MG/2ML IJ SOLN
INTRAMUSCULAR | Status: DC | PRN
  Administered 2018-10-25: 2 mg via INTRAVENOUS

## 2018-10-25 MED ORDER — PROPOFOL 10 MG/ML IV BOLUS
INTRAVENOUS | Status: AC
  Filled 2018-10-25: qty 20

## 2018-10-25 MED ORDER — SUCCINYLCHOLINE CHLORIDE 20 MG/ML IJ SOLN
INTRAMUSCULAR | Status: AC
  Filled 2018-10-25: qty 1

## 2018-10-25 MED ORDER — KETAMINE HCL 50 MG/ML IJ SOLN
INTRAMUSCULAR | Status: AC
  Filled 2018-10-25: qty 10

## 2018-10-25 MED ORDER — DEXAMETHASONE SODIUM PHOSPHATE 10 MG/ML IJ SOLN
INTRAMUSCULAR | Status: DC | PRN
  Administered 2018-10-25: 10 mg via INTRAVENOUS

## 2018-10-25 MED ORDER — ROCURONIUM BROMIDE 50 MG/5ML IV SOLN
INTRAVENOUS | Status: AC
  Filled 2018-10-25: qty 1

## 2018-10-25 MED ORDER — LIDOCAINE HCL (PF) 2 % IJ SOLN
INTRAMUSCULAR | Status: AC
  Filled 2018-10-25: qty 60

## 2018-10-25 MED ORDER — PROPOFOL 10 MG/ML IV BOLUS
INTRAVENOUS | Status: DC | PRN
  Administered 2018-10-25: 40 mg via INTRAVENOUS

## 2018-10-25 MED ORDER — CHLORHEXIDINE GLUCONATE 4 % EX LIQD: 60.0000 mL | Freq: Once | CUTANEOUS | Status: DC

## 2018-10-25 MED ORDER — GLYCOPYRROLATE 0.2 MG/ML IJ SOLN
INTRAMUSCULAR | Status: DC | PRN
  Administered 2018-10-25: 0.2 mg via INTRAVENOUS

## 2018-10-25 MED ORDER — CEFAZOLIN SODIUM-DEXTROSE 2-4 GM/100ML-% IV SOLN
INTRAVENOUS | Status: AC
  Filled 2018-10-25: qty 100

## 2018-10-25 MED ORDER — MIDAZOLAM HCL 2 MG/2ML IJ SOLN
INTRAMUSCULAR | Status: AC
  Filled 2018-10-25: qty 2

## 2018-10-25 MED ORDER — BUPIVACAINE-EPINEPHRINE (PF) 0.5% -1:200000 IJ SOLN
INTRAMUSCULAR | Status: DC | PRN
  Administered 2018-10-25: 10 mL
  Administered 2018-10-25: 20 mL

## 2018-10-25 MED ORDER — LIDOCAINE HCL 1 % IJ SOLN
INTRAMUSCULAR | Status: DC | PRN
  Administered 2018-10-25: 10 mL
  Administered 2018-10-25: 30 mL

## 2018-10-25 MED ORDER — GLYCOPYRROLATE 0.2 MG/ML IJ SOLN
INTRAMUSCULAR | Status: AC
  Filled 2018-10-25: qty 6

## 2018-10-25 MED ORDER — DEXAMETHASONE SODIUM PHOSPHATE 10 MG/ML IJ SOLN
INTRAMUSCULAR | Status: AC
  Filled 2018-10-25: qty 5

## 2018-10-25 MED ORDER — ONDANSETRON HCL 4 MG/2ML IJ SOLN
INTRAMUSCULAR | Status: DC | PRN
  Administered 2018-10-25: 4 mg via INTRAVENOUS

## 2018-10-25 MED ORDER — SODIUM CHLORIDE 0.9 % IV SOLN
INTRAVENOUS | Status: DC
  Administered 2018-10-25 (×2): via INTRAVENOUS

## 2018-10-25 MED ORDER — IOHEXOL 180 MG/ML  SOLN
INTRAMUSCULAR | Status: DC | PRN
  Administered 2018-10-25: 14:00:00 40 mL

## 2018-10-25 MED ORDER — KETAMINE HCL 50 MG/ML IJ SOLN
INTRAMUSCULAR | Status: DC | PRN
  Administered 2018-10-25 (×3): 25 mg via INTRAMUSCULAR

## 2018-10-25 MED ORDER — PHENYLEPHRINE HCL (PRESSORS) 10 MG/ML IV SOLN
INTRAVENOUS | Status: DC | PRN
  Administered 2018-10-25: 100 ug via INTRAVENOUS

## 2018-10-25 MED ORDER — FENTANYL CITRATE (PF) 100 MCG/2ML IJ SOLN
INTRAMUSCULAR | Status: DC | PRN
  Administered 2018-10-25: 25 ug via INTRAVENOUS

## 2018-10-25 SURGICAL SUPPLY — 28 items
BIT DRILL KYPHON EXPRESS (MISCELLANEOUS) ×2 IMPLANT
CEMENT KYPHON CX01A KIT/MIXER (Cement) ×5 IMPLANT
COVER WAND RF STERILE (DRAPES) ×3 IMPLANT
DERMABOND ADVANCED (GAUZE/BANDAGES/DRESSINGS) ×4
DERMABOND ADVANCED .7 DNX12 (GAUZE/BANDAGES/DRESSINGS) ×1 IMPLANT
DEVICE BIOPSY BONE KYPH (INSTRUMENTS) ×6 IMPLANT
DEVICE BIOPSY BONE KYPHX (INSTRUMENTS) ×3 IMPLANT
DRAPE C-ARM XRAY 36X54 (DRAPES) ×3 IMPLANT
DRILL KYPHON EXPRESS (MISCELLANEOUS) ×6
DURAPREP 26ML APPLICATOR (WOUND CARE) ×3 IMPLANT
FEE RENTAL RFA GENERATOR (MISCELLANEOUS) IMPLANT
GLOVE SURG SYN 9.0  PF PI (GLOVE) ×2
GLOVE SURG SYN 9.0 PF PI (GLOVE) ×1 IMPLANT
GOWN SRG 2XL LVL 4 RGLN SLV (GOWNS) ×1 IMPLANT
GOWN STRL NON-REIN 2XL LVL4 (GOWNS) ×2
GOWN STRL REUS W/ TWL LRG LVL3 (GOWN DISPOSABLE) ×1 IMPLANT
GOWN STRL REUS W/TWL LRG LVL3 (GOWN DISPOSABLE) ×2
KIT OSTEOCOOL BONE ACCESS 10G (MISCELLANEOUS) ×4 IMPLANT
KIT TURNOVER KIT A (KITS) ×3 IMPLANT
PACK KYPHOPLASTY (MISCELLANEOUS) ×3 IMPLANT
RENTAL RFA  GENERATOR (MISCELLANEOUS)
RENTAL RFA GENERATOR (MISCELLANEOUS) IMPLANT
STRAP SAFETY 5IN WIDE (MISCELLANEOUS) ×3 IMPLANT
SYS CARTRIDGE BONE CEMENT 8ML (SYSTAGENIX WOUND MANAGEMENT) ×3
SYSTEM CARTRIDG BONE CEMNT 8ML (SYSTAGENIX WOUND MANAGEMENT) IMPLANT
SYSTEM GUN BONE FILLER SZ2 (MISCELLANEOUS) ×2 IMPLANT
TRAY KYPHOPAK 15/3 EXPRESS 1ST (MISCELLANEOUS) ×1 IMPLANT
TRAY KYPHOPAK 20/3 EXPRESS 1ST (MISCELLANEOUS) ×3 IMPLANT

## 2018-10-25 NOTE — Op Note (Signed)
Date October 25, 2018  time 1:52 PM   PATIENT:  Cheryl Leonard   PRE-OPERATIVE DIAGNOSIS:  closed wedge compression fracture of L4 and sacral insufficiency fracture   POST-OPERATIVE DIAGNOSIS:  closed wedge compression fracture of L4 and sacral insufficiency fracture   PROCEDURE:  Procedure(s): KYPHOPLASTY L4, sacral plasty  SURGEON: Laurene Footman, MD   ASSISTANTS: None   ANESTHESIA:   local and MAC   EBL:  No intake/output data recorded.   BLOOD ADMINISTERED:none   DRAINS: none    LOCAL MEDICATIONS USED:  MARCAINE    and XYLOCAINE    SPECIMEN:   L4 vertebral body   DISPOSITION OF SPECIMEN:  Pathology   COUNTS:  YES   TOURNIQUET:  * No tourniquets in log *   IMPLANTS: Bone cement   DICTATION: .Dragon Dictation  patient was brought to the operating room and after adequate anesthesia was obtained the patient was placed prone.  C arm was brought in in good visualization of the affected level obtained on both AP and lateral projections.  After patient identification and timeout procedures were completed, local anesthetic was infiltrated with 10 cc 1% Xylocaine infiltrated subcutaneously.  This is done the area on the right side of the planned approach.  The back was then prepped and draped you sterile manner and repeat timeout procedure carried out.  A spinal needle was brought down to the pedicle on the both sides of  L4 and a 50-50 mix of 1% Xylocaine half percent Sensorcaine with epinephrine total of 20 cc injected.  After allowing this to set a small incision was made and the trocar was advanced into the vertebral body in an transedicular fashion.  Biopsy was obtained Drilling was carried out balloon inserted with inflation to  3 cc on the left and 2 on the right.  When the cement was appropriate consistency 6-1/2 cc were injected into the vertebral body with small amount of extravasation into the vein but it did not appear to leave that area, good fill superior to inferior  endplates and from right to left sides along the inferior endplate.  After the cement had set the trochar was removed and permanent C-arm views obtained.  Next attention was turned to the sacrum where spinal needle was used to get local down to the sacrum in the appropriate area on the right and left sides 10 cc of the 50-50 mix.  After allowing this to set, small incisions were made and the small trocar was advanced on both sides entering low and using a long axis approach to get into the border between the neuroforamen and SI joint on both sides.  After drilling the cement was mixed and injection of bone cement carried out with good fill on both sides no extravasation of the SI joint area neuroforamen.  Once that bone cement had set trochars removed and permanent C-arm views obtained of the sacrum. The wounds were closed with Dermabond followed by Band-Aids.   PLAN OF CARE: Discharge to home after PACU   PATIENT DISPOSITION:  PACU - hemodynamically stable.

## 2018-10-25 NOTE — Transfer of Care (Signed)
Immediate Anesthesia Transfer of Care Note  Patient: Cheryl Leonard  Procedure(s) Performed: L4 KYPHOPLASTY (N/A ) S1 SACROPLASTY (N/A )  Patient Location: PACU  Anesthesia Type:General  Level of Consciousness: awake, drowsy and patient cooperative  Airway & Oxygen Therapy: Patient Spontanous Breathing and Patient connected to nasal cannula oxygen  Post-op Assessment: Report given to RN and Post -op Vital signs reviewed and stable  Post vital signs: Reviewed and stable  Last Vitals:  Vitals Value Taken Time  BP    Temp    Pulse    Resp    SpO2      Last Pain:  Vitals:   10/25/18 1124  TempSrc: Oral  PainSc: 0-No pain         Complications: No apparent anesthesia complications

## 2018-10-25 NOTE — H&P (Signed)
Reviewed paper H+P, will be scanned into chart. No changes noted.  

## 2018-10-25 NOTE — Anesthesia Post-op Follow-up Note (Signed)
Anesthesia QCDR form completed.        

## 2018-10-25 NOTE — Discharge Instructions (Addendum)
AMBULATORY SURGERY  DISCHARGE INSTRUCTIONS   1) The drugs that you were given will stay in your system until tomorrow so for the next 24 hours you should not:  A) Drive an automobile B) Make any legal decisions C) Drink any alcoholic beverage   2) You may resume regular meals tomorrow.  Today it is better to start with liquids and gradually work up to solid foods.  You may eat anything you prefer, but it is better to start with liquids, then soup and crackers, and gradually work up to solid foods.   3) Please notify your doctor immediately if you have any unusual bleeding, trouble breathing, redness and pain at the surgery site, drainage, fever, or pain not relieved by medication. 4)   5) Your post-operative visit with Dr.                                     is: Date:                        Time:    Please call to schedule your post-operative visit.  6) Additional Instructions:      Take it easy today and tomorrow.  Remove Band-Aids on Saturday then okay to shower.  Minimize activities until Saturday and then resume more normal activities.  Pain medicine as directed.  Call office if you are having problems.

## 2018-10-25 NOTE — Anesthesia Preprocedure Evaluation (Signed)
Anesthesia Evaluation  Patient identified by MRN, date of birth, ID band Patient awake    Reviewed: Allergy & Precautions, NPO status , Patient's Chart, lab work & pertinent test results  History of Anesthesia Complications Negative for: history of anesthetic complications  Airway Mallampati: II  TM Distance: >3 FB Neck ROM: Full    Dental  (+) Upper Dentures, Lower Dentures   Pulmonary neg sleep apnea, neg COPD, former smoker,    breath sounds clear to auscultation- rhonchi (-) wheezing      Cardiovascular hypertension, Pt. on medications + Peripheral Vascular Disease  (-) CAD, (-) Past MI, (-) Cardiac Stents and (-) CABG  Rhythm:Regular Rate:Normal - Systolic murmurs and - Diastolic murmurs    Neuro/Psych neg Seizures Anxiety negative neurological ROS     GI/Hepatic Neg liver ROS, GERD  ,  Endo/Other  diabetes, Insulin DependentHypothyroidism   Renal/GU Renal InsufficiencyRenal disease     Musculoskeletal  (+) Arthritis ,   Abdominal (+) - obese,   Peds  Hematology  (+) Blood dyscrasia, anemia ,   Anesthesia Other Findings Past Medical History: No date: Anxiety No date: Arthritis No date: Cataracts, both eyes No date: GERD (gastroesophageal reflux disease) No date: Gout No date: History of fracture of patella     Comment:  right knee No date: History of positive PPD     Comment:  Patient always shows positive No date: Hyperlipidemia No date: Hypertension No date: Hypothyroidism 16/10/9602: Lichen sclerosus     Comment:  of vulva No date: Metatarsal fracture No date: Neuropathy No date: Peripheral vascular disease (HCC) No date: Polyneuropathy     Comment:  numbness and tingling in feet and toes No date: Renal insufficiency No date: Type 2 diabetes mellitus, uncontrolled (HCC)   Reproductive/Obstetrics                             Anesthesia Physical  Anesthesia  Plan  ASA: III  Anesthesia Plan: General   Post-op Pain Management:    Induction: Intravenous  PONV Risk Score and Plan: 2 and Ondansetron, Dexamethasone, Midazolam, Propofol infusion, TIVA and Treatment may vary due to age or medical condition  Airway Management Planned: Nasal Cannula and Natural Airway  Additional Equipment:   Intra-op Plan:   Post-operative Plan: Extubation in OR  Informed Consent: I have reviewed the patients History and Physical, chart, labs and discussed the procedure including the risks, benefits and alternatives for the proposed anesthesia with the patient or authorized representative who has indicated his/her understanding and acceptance.     Dental advisory given  Plan Discussed with: CRNA and Anesthesiologist  Anesthesia Plan Comments: (Patient consented for risks of anesthesia including but not limited to:  - adverse reactions to medications - risk of intubation if required - damage to teeth, lips or other oral mucosa - sore throat or hoarseness - Damage to heart, brain, lungs or loss of life  Patient voiced understanding.)        Anesthesia Quick Evaluation

## 2018-10-25 NOTE — Anesthesia Postprocedure Evaluation (Signed)
Anesthesia Post Note  Patient: Cheryl Leonard  Procedure(s) Performed: L4 KYPHOPLASTY (N/A ) S1 SACROPLASTY (N/A )  Patient location during evaluation: PACU Anesthesia Type: General Level of consciousness: awake and alert Pain management: pain level controlled Vital Signs Assessment: post-procedure vital signs reviewed and stable Respiratory status: spontaneous breathing, nonlabored ventilation, respiratory function stable and patient connected to nasal cannula oxygen Cardiovascular status: blood pressure returned to baseline and stable Postop Assessment: no apparent nausea or vomiting Anesthetic complications: no     Last Vitals:  Vitals:   10/25/18 1449 10/25/18 1510  BP: (!) 161/72 135/69  Pulse: 84 81  Resp: 16 16  Temp:    SpO2: 100% 99%    Last Pain:  Vitals:   10/25/18 1510  TempSrc:   PainSc: 0-No pain                 Precious Haws Piscitello

## 2018-10-26 ENCOUNTER — Encounter: Payer: Self-pay | Admitting: Orthopedic Surgery

## 2018-10-29 ENCOUNTER — Other Ambulatory Visit (INDEPENDENT_AMBULATORY_CARE_PROVIDER_SITE_OTHER): Payer: Self-pay | Admitting: Vascular Surgery

## 2018-10-29 DIAGNOSIS — Z95820 Peripheral vascular angioplasty status with implants and grafts: Secondary | ICD-10-CM

## 2018-10-29 DIAGNOSIS — I70223 Atherosclerosis of native arteries of extremities with rest pain, bilateral legs: Secondary | ICD-10-CM

## 2018-10-29 LAB — SURGICAL PATHOLOGY

## 2018-10-30 ENCOUNTER — Encounter (INDEPENDENT_AMBULATORY_CARE_PROVIDER_SITE_OTHER): Payer: Self-pay | Admitting: Vascular Surgery

## 2018-10-30 ENCOUNTER — Ambulatory Visit (INDEPENDENT_AMBULATORY_CARE_PROVIDER_SITE_OTHER): Payer: Medicare Other | Admitting: Vascular Surgery

## 2018-10-30 ENCOUNTER — Ambulatory Visit (INDEPENDENT_AMBULATORY_CARE_PROVIDER_SITE_OTHER): Payer: Medicare Other

## 2018-10-30 ENCOUNTER — Other Ambulatory Visit: Payer: Self-pay

## 2018-10-30 VITALS — BP 134/70 | HR 86 | Resp 16 | Wt 169.6 lb

## 2018-10-30 DIAGNOSIS — I739 Peripheral vascular disease, unspecified: Secondary | ICD-10-CM | POA: Diagnosis not present

## 2018-10-30 DIAGNOSIS — Z95820 Peripheral vascular angioplasty status with implants and grafts: Secondary | ICD-10-CM | POA: Diagnosis not present

## 2018-10-30 DIAGNOSIS — I10 Essential (primary) hypertension: Secondary | ICD-10-CM

## 2018-10-30 DIAGNOSIS — E1165 Type 2 diabetes mellitus with hyperglycemia: Secondary | ICD-10-CM | POA: Diagnosis not present

## 2018-10-30 DIAGNOSIS — E782 Mixed hyperlipidemia: Secondary | ICD-10-CM

## 2018-10-30 DIAGNOSIS — I70223 Atherosclerosis of native arteries of extremities with rest pain, bilateral legs: Secondary | ICD-10-CM

## 2018-10-30 NOTE — Assessment & Plan Note (Signed)
ABIs today were normal at 1.09 on the right and 1.05 on the left with brisk triphasic waveforms and normal digital pressures.  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  I think continuing on Eliquis would be in her best interest given her multiple previous thrombotic recurrences and her limited options for future revascularization.  She and her husband as always are pleasant and thankful.  I will keep her on a short follow-up interval of 3 months with noninvasive studies.

## 2018-10-30 NOTE — Progress Notes (Signed)
MRN : [id number]  Cheryl Leonard is a 73 y.o. (04-01-1945) female who presents with chief complaint of  Chief Complaint  Patient presents with   Follow-up    [facility] 3week ABI  .  History of Present Illness: Patient returns today in follow up of her PAD.  About a month ago, she underwent extensive bilateral lower extremity revascularizations.  This is after open revascularizations on both sides earlier this year and previous percutaneous interventions.  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  She has done well with some reperfusion swelling after procedures but otherwise no major symptoms.  She did not have any periprocedural complications. ABIs today were normal at 1.09 on the right and 1.05 on the left with brisk triphasic waveforms and normal digital pressures.  Current Outpatient Medications  Medication Sig Dispense Refill   acetaminophen (TYLENOL) 500 MG tablet Take 1,000 mg by mouth every 6 (six) hours as needed for moderate pain or headache.     Aflibercept (EYLEA) 2 MG/0.05ML SOLN 2 mg by Intravitreal route every 5 (five) weeks.      ALPRAZolam (XANAX) 0.25 MG tablet Take 0.25 mg by mouth daily as needed for anxiety or sleep.      apixaban (ELIQUIS) 5 MG TABS tablet Take 1 tablet (5 mg total) by mouth 2 (two) times daily. 60 tablet 2   Ascorbic Acid (VITAMIN C) 1000 MG tablet Take 1,000 mg by mouth daily.     aspirin EC 81 MG tablet Take 81 mg by mouth daily.      buPROPion (WELLBUTRIN XL) 150 MG 24 hr tablet Take 150 mg by mouth daily.      buPROPion (WELLBUTRIN XL) 300 MG 24 hr tablet Take 1 tablet (300 mg total) by mouth daily. (Patient taking differently: Take 300 mg by mouth at bedtime. )     BYSTOLIC 5 MG tablet Take 5 mg by mouth every morning.   2   calcium carbonate (TUMS EX) 750 MG chewable tablet Chew 2,250 mg by mouth daily as needed for heartburn.     chlorhexidine (PERIDEX) 0.12 % solution Use as  directed 15 mLs in the mouth or throat 2 (two) times daily.   0   cholecalciferol (VITAMIN D) 1000 units tablet Take 1,000 Units by mouth 2 (two) times daily.     CORAL CALCIUM PO Take 1 tablet by mouth 2 (two) times daily.      denosumab (PROLIA) 60 MG/ML SOLN injection Inject 60 mg into the skin every 6 (six) months.      DENTA 5000 PLUS 1.1 % CREA dental cream Place 1 application onto teeth 2 (two) times daily. as directed  99   DULoxetine (CYMBALTA) 60 MG capsule Take 1 capsule (60 mg total) by mouth 2 (two) times daily.  3   famotidine (PEPCID) 40 MG tablet Take 40 mg by mouth 2 (two) times daily.      ferrous gluconate (FERGON) 324 MG tablet Take 324 mg by mouth daily with breakfast.     furosemide (LASIX) 20 MG tablet Take 20 mg by mouth daily as needed for fluid or edema.      Insulin Pen Needle (B-D UF III MINI PEN NEEDLES) 31G X 5 MM MISC once daily.     levothyroxine (SYNTHROID, LEVOTHROID) 100 MCG tablet Take 100 mcg by mouth daily before breakfast.   3   lisinopril (PRINIVIL,ZESTRIL) 10 MG tablet Take 10 mg by mouth every morning.  Magnesium 500 MG TABS Take 500 mg by mouth every morning.      metFORMIN (GLUCOPHAGE) 1000 MG tablet Take 1,000 mg by mouth 2 (two) times daily.     Multiple Vitamin (MULTIVITAMIN WITH MINERALS) TABS tablet Take 1 tablet by mouth daily.     pantoprazole (PROTONIX) 40 MG tablet Take 40 mg by mouth every morning.      rosuvastatin (CRESTOR) 20 MG tablet Take 20 mg by mouth every morning.      traMADol (ULTRAM) 50 MG tablet Take 1 tablet (50 mg total) by mouth every 6 (six) hours as needed. 20 tablet 0   TRESIBA FLEXTOUCH 200 UNIT/ML SOPN Inject 30 Units as directed at bedtime.   5   VASCEPA 1 g CAPS Take 1 capsule by mouth 2 (two) times daily.     vitamin B-12 (CYANOCOBALAMIN) 1000 MCG tablet Take 1,000 mcg by mouth daily.     vitamin E 400 UNIT capsule Take 400 Units by mouth daily.     zolpidem (AMBIEN) 5 MG tablet Take 1 tablet  (5 mg total) by mouth at bedtime. (Patient taking differently: Take 5 mg by mouth at bedtime as needed. ) 30 tablet 0   Current Facility-Administered Medications  Medication Dose Route Frequency Provider Last Rate Last Dose   aflibercept (EYLEA) SOLN 2 mg  2 mg Intravitreal  Rennis ChrisZamora, Brian, MD   2 mg at 06/27/17 1307   aflibercept (EYLEA) SOLN 2 mg  2 mg Intravitreal  Rennis ChrisZamora, Brian, MD   2 mg at 07/26/17 1702   aflibercept (EYLEA) SOLN 2 mg  2 mg Intravitreal  Rennis ChrisZamora, Brian, MD   2 mg at 09/06/17 1700   aflibercept (EYLEA) SOLN 2 mg  2 mg Intravitreal  Rennis ChrisZamora, Brian, MD   2 mg at 11/01/17 1550   aflibercept (EYLEA) SOLN 2 mg  2 mg Intravitreal  Rennis ChrisZamora, Brian, MD   2 mg at 01/03/18 1416   Bevacizumab (AVASTIN) SOLN 1.25 mg  1.25 mg Intravitreal  Rennis ChrisZamora, Brian, MD   1.25 mg at 02/10/17 [id number]   Bevacizumab (AVASTIN) SOLN 1.25 mg  1.25 mg Intravitreal  Rennis ChrisZamora, Brian, MD   1.25 mg at 03/13/17 1717   Bevacizumab (AVASTIN) SOLN 1.25 mg  1.25 mg Intravitreal  Rennis ChrisZamora, Brian, MD   1.25 mg at 04/11/17 1626   Bevacizumab (AVASTIN) SOLN 1.25 mg  1.25 mg Intravitreal  Rennis ChrisZamora, Brian, MD   1.25 mg at 02/12/18 2154   insulin aspart (novoLOG) injection 0-15 Units  0-15 Units Subcutaneous TID WC [name], Cala BradfordKimberly A, PA-C       insulin aspart (novoLOG) injection 0-5 Units  0-5 Units Subcutaneous QHS [name], Ranae PlumberKimberly A, PA-C        Past Medical History:  Diagnosis Date   Anemia    Anxiety    Arthritis    Cataracts, both eyes    Diabetic retinopathy (HCC)    NPDR OU   GERD (gastroesophageal reflux disease)    Gout    Headache    h/o migraines   History of fracture of patella    right knee   History of positive PPD    Patient always shows positive   Hyperlipidemia    Hypertension    Hypertensive retinopathy    OU   Hypothyroidism    Lichen sclerosus 12/30/2013   of vulva   Metatarsal fracture    Neuropathy    Peripheral vascular disease (HCC)    Polyneuropathy     numbness and tingling in feet  and toes   Renal insufficiency    Sleep apnea    does not use cpap-lost weight    Type 2 diabetes mellitus, uncontrolled (HCC)     Past Surgical History:  Procedure Laterality Date   APPENDECTOMY     BREAST REDUCTION SURGERY  2001   CATARACT EXTRACTION     CESAREAN SECTION  1976   COLONOSCOPY  03/05/2013   Nml - due for repeat 03/06/2018   DILATION AND CURETTAGE OF UTERUS  1989   ENDARTERECTOMY FEMORAL Bilateral 03/09/2018   Procedure: ENDARTERECTOMY FEMORAL;  Surgeon: Annice Needy, MD;  Location: [facility];  Service: Vascular;  Laterality: Bilateral;   ENDARTERECTOMY POPLITEAL Left 03/09/2018   Procedure: ENDARTERECTOMY POPLITEAL AND SFA;  Surgeon: Annice Needy, MD;  Location: [facility];  Service: Vascular;  Laterality: Left;   ESOPHAGOGASTRODUODENOSCOPY  03/05/2013   EYE SURGERY     Eyelid Surgery  2012   INTRAMEDULLARY (IM) NAIL INTERTROCHANTERIC Left 10/30/2015   Procedure: INTRAMEDULLARY (IM) NAIL INTERTROCHANTRIC ;  Surgeon: Kennedy Bucker, MD;  Location: [facility];  Service: Orthopedics;  Laterality: Left;   KYPHOPLASTY N/A 10/25/2018   Procedure: L4 KYPHOPLASTY;  Surgeon: Kennedy Bucker, MD;  Location: [facility];  Service: Orthopedics;  Laterality: N/A;   LAPAROSCOPIC HYSTERECTOMY  2000   total   LOWER EXTREMITY ANGIOGRAPHY Left 03/08/2017   Procedure: LOWER EXTREMITY ANGIOGRAPHY;  Surgeon: Annice Needy, MD;  Location: [facility];  Service: Cardiovascular;  Laterality: Left;   LOWER EXTREMITY ANGIOGRAPHY Left 10/30/2017   Procedure: LOWER EXTREMITY ANGIOGRAPHY;  Surgeon: Annice Needy, MD;  Location: [facility];  Service: Cardiovascular;  Laterality: Left;   LOWER EXTREMITY ANGIOGRAPHY Right 03/08/2018   Procedure: LOWER EXTREMITY ANGIOGRAPHY;  Surgeon: Annice Needy, MD;  Location: [facility];  Service: Cardiovascular;  Laterality: Right;   LOWER EXTREMITY ANGIOGRAPHY Left 10/01/2018   Procedure: LOWER EXTREMITY  ANGIOGRAPHY;  Surgeon: Annice Needy, MD;  Location: [facility];  Service: Cardiovascular;  Laterality: Left;   LOWER EXTREMITY ANGIOGRAPHY Right 10/08/2018   Procedure: LOWER EXTREMITY ANGIOGRAPHY;  Surgeon: Annice Needy, MD;  Location: [facility];  Service: Cardiovascular;  Laterality: Right;   PERIPHERAL VASCULAR INTERVENTION  03/08/2018   Procedure: PERIPHERAL VASCULAR INTERVENTION;  Surgeon: Annice Needy, MD;  Location: [facility];  Service: Cardiovascular;;   REDUCTION MAMMAPLASTY  1997   SACROPLASTY N/A 10/25/2018   Procedure: S1 SACROPLASTY;  Surgeon: Kennedy Bucker, MD;  Location: [facility];  Service: Orthopedics;  Laterality: N/A;    Tobacco Use   Smoking status: Former Smoker    Packs/day: 1.00    Years: 20.00    Pack years: 20.00    Types: Cigarettes    Last attempt to quit: 03/07/1996    Years since quitting: 21.3   Smokeless tobacco: Never Used   Tobacco comment: started smoking at age 72  Substance Use Topics   Alcohol use: No    Alcohol/week: 0.0 oz   Drug use: No          Family History  Problem Relation Age of Onset   Coronary artery disease Father    Heart attack Father    Coronary artery disease Mother    Heart attack Mother    Ovarian cancer Sister 23   sister had hormonal therapy for IVF txs-which increased risk factor for ovarian cancer   Breast cancer Neg Hx      No Known Allergies  REVIEW OF SYSTEMS(Negative unless checked)  Constitutional: ???Weight loss ???Fever ???Chills Cardiac: ???Chest pain ???Chest pressure ???Palpitations ???Shortness of breath when laying flat ???Shortness of breath at rest ???Shortness of breath with exertion. Vascular: ???Pain in legs with walking ???Pain in legs at rest ???Pain in legs when laying flat ???Claudication ???Pain in feet when walking ???Pain in feet at rest ???Pain in feet  when laying flat ???History of DVT ???Phlebitis ???Swelling in legs ???Varicose veins ???Non-healing ulcers Pulmonary: ???Uses home oxygen ???Productive cough ???Hemoptysis ???Wheeze ???COPD ???Asthma Neurologic: ???Dizziness ???Blackouts ???Seizures ???History of stroke ???History of TIA ???Aphasia ???Temporary blindness ???Dysphagia ???Weakness or numbness in arms ???Weakness or numbness in legs Musculoskeletal: ???Arthritis ???Joint swelling ???Joint pain ???Low back pain Hematologic: ???Easy bruising ???Easy bleeding ???Hypercoagulable state ???Anemic ???Hepatitis Gastrointestinal: ???Blood in stool ???Vomiting blood ???Gastroesophageal reflux/heartburn ???Difficulty swallowing. Genitourinary: ???Chronic kidney disease ???Difficult urination ???Frequent urination ???Burning with urination ???Blood in urine Skin: ???Rashes ???Ulcers ???Wounds Psychological: ???History of anxiety ???History of major depression.     Physical Examination  BP 134/70 (BP Location: Right Arm)    Pulse 86    Resp 16    Wt 169 lb 9.6 oz (76.9 kg)    BMI 28.22 kg/m  Gen:  WD/WN, NAD Head: [place]/AT, No temporalis wasting. Ear/Nose/Throat: Hearing grossly intact, nares w/o erythema or drainage Eyes: Conjunctiva clear. Sclera non-icteric Neck: Supple.  Trachea midline Pulmonary:  Good air movement, no use of accessory muscles.  Cardiac: RRR, no JVD Vascular:  Vessel Right Left  Radial Palpable Palpable                          PT Palpable Palpable  DP Palpable Palpable    Musculoskeletal: M/S 5/5 throughout.  No deformity or atrophy.  1+ right lower extremity edema, trace left lower extremity edema. Neurologic: Sensation grossly intact in extremities.  Symmetrical.  Speech is fluent.  Psychiatric: Judgment intact, Mood & affect appropriate  for pt's clinical situation. Dermatologic: No rashes or ulcers noted.  No cellulitis or open wounds.       Labs Recent Results (from the past 2160 hour(s))  SARS CORONAVIRUS 2 (TAT 6-24 HRS) Nasopharyngeal Nasopharyngeal Swab     Status: None   Collection Time: 09/27/18 10:14 AM   Specimen: Nasopharyngeal Swab  Result Value Ref Range   SARS Coronavirus 2 NEGATIVE NEGATIVE    Comment: (NOTE) SARS-CoV-2 target nucleic acids are NOT DETECTED. The SARS-CoV-2 RNA is generally detectable in upper and lower respiratory specimens during the acute phase of infection. Negative results do not preclude SARS-CoV-2 infection, do not rule out co-infections with other pathogens, and should not be used as the sole basis for treatment or other patient management decisions. Negative results must be combined with clinical observations, patient history, and epidemiological information. The expected result is Negative. Fact Sheet for Patients: HairSlick.no Fact Sheet for Healthcare Providers: quierodirigir.com This test is not yet approved or cleared by the [place] FDA and  has been authorized for detection and/or diagnosis of SARS-CoV-2 by FDA under an Emergency Use Authorization (EUA). This EUA will remain  in effect (meaning this test can be used) for the duration of the COVID-19 declaration under Section 56 4(b)(1) of the Act, 21 U.S.C. section 360bbb-3(b)(1), unless the authorization is terminated or revoked sooner. Performed at [facility], [address], [place], [place] [address]   BUN     Status: None   Collection Time: 10/01/18 10:33 AM  Result Value Ref Range  BUN 21 8 - 23 mg/dL    Comment: Performed at [facility], [address] [place], [place] [address]  Creatinine, serum     Status: Abnormal   Collection Time: 10/01/18 10:33 AM  Result Value Ref Range   Creatinine, Ser 1.35 (H) 0.44 - 1.00 mg/dL    GFR calc non Af Amer 39 (L) >60 mL/min   GFR calc Af Amer 45 (L) >60 mL/min    Comment: Performed at [facility], [address] [place], [place] [address]  SARS CORONAVIRUS 2 (TAT 6-24 HRS) Nasopharyngeal Nasopharyngeal Swab     Status: None   Collection Time: 10/04/18  1:06 PM   Specimen: Nasopharyngeal Swab  Result Value Ref Range   SARS Coronavirus 2 NEGATIVE NEGATIVE    Comment: (NOTE) SARS-CoV-2 target nucleic acids are NOT DETECTED. The SARS-CoV-2 RNA is generally detectable in upper and lower respiratory specimens during the acute phase of infection. Negative results do not preclude SARS-CoV-2 infection, do not rule out co-infections with other pathogens, and should not be used as the sole basis for treatment or other patient management decisions. Negative results must be combined with clinical observations, patient history, and epidemiological information. The expected result is Negative. Fact Sheet for Patients: HairSlick.no Fact Sheet for Healthcare Providers: quierodirigir.com This test is not yet approved or cleared by the [place] FDA and  has been authorized for detection and/or diagnosis of SARS-CoV-2 by FDA under an Emergency Use Authorization (EUA). This EUA will remain  in effect (meaning this test can be used) for the duration of the COVID-19 declaration under Section 56 4(b)(1) of the Act, 21 U.S.C. section 360bbb-3(b)(1), unless the authorization is terminated or revoked sooner. Performed at [facility], [address], [place], [place] [address]   Glucose, capillary     Status: Abnormal   Collection Time: 10/08/18  8:06 AM  Result Value Ref Range   Glucose-Capillary 155 (H) 70 - 99 mg/dL  BUN     Status: Abnormal   Collection Time: 10/08/18  8:11 AM  Result Value Ref Range   BUN 24 (H) 8 - 23 mg/dL    Comment: Performed at [facility], [address]  [place], [place] [address]  Creatinine, serum     Status: Abnormal   Collection Time: 10/08/18  8:11 AM  Result Value Ref Range   Creatinine, Ser 1.68 (H) 0.44 - 1.00 mg/dL   GFR calc non Af Amer 30 (L) >60 mL/min   GFR calc Af Amer 35 (L) >60 mL/min    Comment: Performed at [facility], [address] [place], [place] [address]  Glucose, capillary     Status: Abnormal   Collection Time: 10/08/18 10:52 AM  Result Value Ref Range   Glucose-Capillary 142 (H) 70 - 99 mg/dL  CBC     Status: Abnormal   Collection Time: 10/24/18 11:46 AM  Result Value Ref Range   WBC 8.4 4.0 - 10.5 K/uL   RBC 2.97 (L) 3.87 - 5.11 MIL/uL   Hemoglobin 7.9 (L) 12.0 - 15.0 g/dL   HCT 13.0 (L) 86.5 - 78.4 %   MCV 86.9 80.0 - 100.0 fL   MCH 26.6 26.0 - 34.0 pg   MCHC 30.6 30.0 - 36.0 g/dL   RDW 69.6 29.5 - 28.4 %   Platelets 399 150 - 400 K/uL   nRBC 0.0 0.0 - 0.2 %    Comment: Performed at [facility], [address]  [place], [place] [address]  SARS CORONAVIRUS 2 (TAT 6-24 HRS) Nasopharyngeal Nasopharyngeal Swab     Status: None   Collection Time: 10/24/18  1:05 PM   Specimen: Nasopharyngeal Swab  Result Value Ref Range   SARS Coronavirus 2 NEGATIVE NEGATIVE    Comment: (NOTE) SARS-CoV-2 target nucleic acids are NOT DETECTED. The SARS-CoV-2 RNA is generally detectable in upper and lower respiratory specimens during the acute phase of infection. Negative results do not preclude SARS-CoV-2 infection, do not rule out co-infections with other pathogens, and should not be used as the sole basis for treatment or other patient management decisions. Negative results must be combined with clinical observations, patient history, and epidemiological information. The expected result is Negative. Fact Sheet for Patients: HairSlick.no Fact Sheet for Healthcare Providers: quierodirigir.com This test is not yet approved or cleared by the [place] FDA and  has been authorized for detection and/or diagnosis of SARS-CoV-2 by FDA under an Emergency Use Authorization (EUA). This EUA will remain  in effect (meaning this test can be used) for the duration of the COVID-19 declaration under Section 56 4(b)(1) of the Act, 21 U.S.C. section 360bbb-3(b)(1), unless the authorization is terminated or revoked sooner. Performed at [facility], [address] [place], [place] [address]   Glucose, capillary     Status: Abnormal   Collection Time: 10/25/18 11:26 AM  Result Value Ref Range   Glucose-Capillary 159 (H) 70 - 99 mg/dL  Surgical pathology     Status: None   Collection Time: 10/25/18  1:13 PM  Result Value Ref Range   SURGICAL PATHOLOGY      SURGICAL PATHOLOGY CASE: [id number] PATIENT: [facility] Surgical Pathology Report     Specimen Submitted: A. L4 bone; bx  Clinical History: Compression fracture of fourth vertebra      DIAGNOSIS: A.  BONE, L4; BIOPSY: - REACTIVE BONE AND MARROW STROMA WITH FOCAL HEMORRHAGE AND CHANGES CONSISTENT WITH PROVIDED HISTORY OF FRACTURE. - NEGATIVE FOR MALIGNANCY.  GROSS DESCRIPTION: A. Labeled: L4 bone biopsy Received: In formalin Tissue fragment(s): 1 Size: 1.2 cm in length and 0.2 cm in diameter Description: Bone tissue core Entirely submitted in 1 cassette after decalcification.    Final Diagnosis performed by Elijah Birk, MD.   Electronically signed 10/29/2018 4:06:23PM The electronic signature indicates that the named Attending Pathologist has evaluated the specimen Technical component performed at [facility], [address] [place], [place] [address] Lab: [phone] Dir: Jolene Schimke, MD, MMM  Professional component performed at [facility], [facility], [address] [place], [place], [place] [address] Lab: [phone] Dir: Georgiann Cocker. Rubinas, MD   Glucose, capillary     Status: Abnormal   Collection Time: 10/25/18  2:01 PM  Result  Value Ref Range   Glucose-Capillary 137 (H) 70 - 99 mg/dL    Radiology Dg Lumbar Spine 2-3 Views  Result Date: 10/25/2018 CLINICAL DATA:  L4 kyphoplasty, sacroplasty EXAM: LUMBAR SPINE - 2-3 VIEW; DG C-ARM 1-60 MIN COMPARISON:  10/21/2018 FLUOROSCOPY TIME:  Radiation Exposure Index (as provided by the fluoroscopic device): 37.5 mGy If the device does not provide the exposure index: Fluoroscopy Time:  1 minutes 38 seconds Number of Acquired Images:  2 FINDINGS: Contrast laden cement is noted throughout the L4 vertebral body. Additionally, contrast is noted within the sacrum bilaterally. IMPRESSION: L4 kyphoplasty and bilateral sacroplasty Electronically Signed   By: Alcide Clever M.D.   On: 10/25/2018 14:40   Mr Lumbar Spine Wo Contrast  Result Date: 10/21/2018 CLINICAL  DATA:  Fell 6 weeks ago with low back pain. EXAM: MRI LUMBAR SPINE WITHOUT CONTRAST TECHNIQUE: Multiplanar, multisequence MR imaging of the lumbar spine was performed. No intravenous contrast was administered. COMPARISON:  None. FINDINGS: Segmentation:  5 lumbar type vertebral bodies. Alignment:  Straightening of the normal lumbar lordosis. Vertebrae: Acute/subacute superior endplate fracture at L4 with loss of height of 25%. Acute sacral insufficiency type fracture at the S2 level involving both sacral ala. Old healed compression deformity at L3 with loss of height of 40%. These look like benign fractures. Conus medullaris and cauda equina: Conus extends to the L1 level. Conus and cauda equina appear normal. Paraspinal and other soft tissues: Negative Disc levels: T12-L1: Normal. L1-2: Mild noncompressive disc bulge. L2-3: Moderate disc bulge. Posterior bowing of the posterosuperior margin of the L3 vertebral body related to the old fracture. Facet and ligamentous hypertrophy. Moderate stenosis at this level that could be symptomatic. L3-4: Bulging of the disc. No retropulsed bone. Mild facet and ligamentous hypertrophy. Mild stenosis  without visible neural compression. L4-5: Bulging of the disc. Facet and ligamentous hypertrophy. Moderate lateral recess stenosis left more than right that could possibly be symptomatic. L5-S1: Bulging of the disc. Facet degeneration. No compressive stenosis. IMPRESSION: Acute/subacute compression fracture at the superior endplate of L4 with loss of height of 40%. Minimal posterior bowing but no frank retropulsion. Acute or subacute fracture of the sacrum at the S2 level involving both sacral ala. These all look like benign fractures. Old healed benign appearing fracture at L3. Moderate multifactorial spinal stenosis at L2-3 that could be symptomatic. Lateral recess narrowing at L4-5 left more than right that could be symptomatic. Electronically Signed   By: Nelson Chimes M.D.   On: 10/21/2018 10:58   Dg C-arm 1-60 Min  Result Date: 10/25/2018 CLINICAL DATA:  L4 kyphoplasty, sacroplasty EXAM: LUMBAR SPINE - 2-3 VIEW; DG C-ARM 1-60 MIN COMPARISON:  10/21/2018 FLUOROSCOPY TIME:  Radiation Exposure Index (as provided by the fluoroscopic device): 37.5 mGy If the device does not provide the exposure index: Fluoroscopy Time:  1 minutes 38 seconds Number of Acquired Images:  2 FINDINGS: Contrast laden cement is noted throughout the L4 vertebral body. Additionally, contrast is noted within the sacrum bilaterally. IMPRESSION: L4 kyphoplasty and bilateral sacroplasty Electronically Signed   By: Inez Catalina M.D.   On: 10/25/2018 14:40    Assessment/Plan Essential hypertension blood pressure control important in reducing the progression of atherosclerotic disease. On appropriate oral medications.   Diabetes mellitus type 2, uncontrolled blood glucose control important in reducing the progression of atherosclerotic disease. Also, involved in wound healing. On appropriate medications.  Hyperlipidemia lipid control important in reducing the progression of atherosclerotic disease. Continue statin  therapy  Peripheral vascular disease, unspecified ([place]) ABIs today were normal at 1.09 on the right and 1.05 on the left with brisk triphasic waveforms and normal digital pressures.  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  I think continuing on Eliquis would be in her best interest given her multiple previous thrombotic recurrences and her limited options for future revascularization.  She and her husband as always are pleasant and thankful.  I will keep her on a short follow-up interval of 3 months with noninvasive studies.    Leotis Pain, MD  10/30/2018 3:36 PM    This note was created with Dragon medical transcription system.  Any errors from dictation are  purely unintentional

## 2018-11-08 ENCOUNTER — Other Ambulatory Visit: Payer: Self-pay | Admitting: Internal Medicine

## 2018-11-08 DIAGNOSIS — R1084 Generalized abdominal pain: Secondary | ICD-10-CM

## 2018-11-08 DIAGNOSIS — D500 Iron deficiency anemia secondary to blood loss (chronic): Secondary | ICD-10-CM

## 2018-11-15 DIAGNOSIS — M810 Age-related osteoporosis without current pathological fracture: Secondary | ICD-10-CM | POA: Insufficient documentation

## 2018-11-15 NOTE — Progress Notes (Signed)
[facility] - Clinic Note  11/20/2018     CHIEF COMPLAINT Patient presents for Retina Follow Up   HISTORY OF PRESENT ILLNESS: Cheryl Leonard is a 73 y.o. female who presents to the clinic today for:   HPI    Retina Follow Up    Patient presents with  CRVO/BRVO.  In right eye.  Severity is moderate.  Duration of 5 weeks.  Since onset it is stable.  I, the attending physician,  performed the HPI with the patient and updated documentation appropriately.          Comments    Patient states vision the same OU. BS was 132 this am. Last a1c was 7.0, about 3 months ago.        Last edited by Rennis ChrisZamora, Adithi Gammon, MD on 11/20/2018  3:50 PM. (History)    Patient states she feels like her vision has gotten better  Referring physician: Thereasa SoloRichardson, William [address] [place] [id number]  HISTORICAL INFORMATION:   Selected notes from the MEDICAL RECORD NUMBER Referred by Dr. Senaida Oresichardson for concern of DME OD Lab Results  Component Value Date   HGBA1C 5.9 (H) 10/29/2015       CURRENT MEDICATIONS: Current Outpatient Medications (Ophthalmic Drugs)  Medication Sig  . Aflibercept (EYLEA) 2 MG/0.05ML SOLN 2 mg by Intravitreal route every 5 (five) weeks.    Current Facility-Administered Medications (Ophthalmic Drugs)  Medication Route  . aflibercept (EYLEA) SOLN 2 mg Intravitreal  . aflibercept (EYLEA) SOLN 2 mg Intravitreal  . aflibercept (EYLEA) SOLN 2 mg Intravitreal  . aflibercept (EYLEA) SOLN 2 mg Intravitreal  . aflibercept (EYLEA) SOLN 2 mg Intravitreal   Current Outpatient Medications (Other)  Medication Sig  . acetaminophen (TYLENOL) 500 MG tablet Take 1,000 mg by mouth every 6 (six) hours as needed for moderate pain or headache.  . ALPRAZolam (XANAX) 0.25 MG tablet Take 0.25 mg by mouth daily as needed for anxiety or sleep.   [name]. apixaban (ELIQUIS) 5 MG TABS tablet Take 1 tablet (5 mg total) by mouth 2 (two) times daily.  . Ascorbic Acid  (VITAMIN C) 1000 MG tablet Take 1,000 mg by mouth daily.  [name]. aspirin EC 81 MG tablet Take 81 mg by mouth daily.   [name]. buPROPion (WELLBUTRIN XL) 150 MG 24 hr tablet Take 150 mg by mouth daily.   [name]. buPROPion (WELLBUTRIN XL) 300 MG 24 hr tablet Take 1 tablet (300 mg total) by mouth daily. (Patient taking differently: Take 300 mg by mouth at bedtime. )  . BYSTOLIC 5 MG tablet Take 5 mg by mouth every morning.   . calcium carbonate (TUMS EX) 750 MG chewable tablet Chew 2,250 mg by mouth daily as needed for heartburn.  . chlorhexidine (PERIDEX) 0.12 % solution Use as directed 15 mLs in the mouth or throat 2 (two) times daily.   . cholecalciferol (VITAMIN D) 1000 units tablet Take 1,000 Units by mouth 2 (two) times daily.  [name]. CORAL CALCIUM PO Take 1 tablet by mouth 2 (two) times daily.   [name]. denosumab (PROLIA) 60 MG/ML SOLN injection Inject 60 mg into the skin every 6 (six) months.   . DENTA 5000 PLUS 1.1 % CREA dental cream Place 1 application onto teeth 2 (two) times daily. as directed  . DULoxetine (CYMBALTA) 60 MG capsule Take 1 capsule (60 mg total) by mouth 2 (two) times daily.  . famotidine (PEPCID) 40 MG tablet Take 40 mg by mouth 2 (two) times daily.   .[name]  ferrous gluconate (FERGON) 324 MG tablet Take 324 mg by mouth daily with breakfast.  . furosemide (LASIX) 20 MG tablet Take 20 mg by mouth daily as needed for fluid or edema.   . Insulin Pen Needle (B-D UF III MINI PEN NEEDLES) 31G X 5 MM MISC once daily.  [name] levothyroxine (SYNTHROID, LEVOTHROID) 100 MCG tablet Take 100 mcg by mouth daily before breakfast.   . lisinopril (PRINIVIL,ZESTRIL) 10 MG tablet Take 10 mg by mouth every morning.   . Magnesium 500 MG TABS Take 500 mg by mouth every morning.   . metFORMIN (GLUCOPHAGE) 1000 MG tablet Take 1,000 mg by mouth 2 (two) times daily.  . Multiple Vitamin (MULTIVITAMIN WITH MINERALS) TABS tablet Take 1 tablet by mouth daily.  . pantoprazole (PROTONIX) 40 MG tablet Take 40 mg by mouth every morning.   .  rosuvastatin (CRESTOR) 20 MG tablet Take 20 mg by mouth every morning.   . traMADol (ULTRAM) 50 MG tablet Take 1 tablet (50 mg total) by mouth every 6 (six) hours as needed.  . TRESIBA FLEXTOUCH 200 UNIT/ML SOPN Inject 30 Units as directed at bedtime.   [name] VASCEPA 1 g CAPS Take 1 capsule by mouth 2 (two) times daily.  . vitamin B-12 (CYANOCOBALAMIN) 1000 MCG tablet Take 1,000 mcg by mouth daily.  . vitamin E 400 UNIT capsule Take 400 Units by mouth daily.  [name] zolpidem (AMBIEN) 5 MG tablet Take 1 tablet (5 mg total) by mouth at bedtime. (Patient taking differently: Take 5 mg by mouth at bedtime as needed. )   Current Facility-Administered Medications (Other)  Medication Route  . Bevacizumab (AVASTIN) SOLN 1.25 mg Intravitreal  . Bevacizumab (AVASTIN) SOLN 1.25 mg Intravitreal  . Bevacizumab (AVASTIN) SOLN 1.25 mg Intravitreal  . Bevacizumab (AVASTIN) SOLN 1.25 mg Intravitreal  . insulin aspart (novoLOG) injection 0-15 Units Subcutaneous  . insulin aspart (novoLOG) injection 0-5 Units Subcutaneous      REVIEW OF SYSTEMS: ROS    Positive for: Endocrine, Eyes   Negative for: Constitutional, Gastrointestinal, Neurological, Skin, Genitourinary, Musculoskeletal, HENT, Cardiovascular, Respiratory, Psychiatric, Allergic/Imm, Heme/Lymph   Last edited by Roselee Nova D, COT on 11/20/2018  2:18 PM. (History)       ALLERGIES No Known Allergies  PAST MEDICAL HISTORY Past Medical History:  Diagnosis Date  . Anemia   . Anxiety   . Arthritis   . Cataracts, both eyes   . Diabetic retinopathy ([place])    NPDR OU  . GERD (gastroesophageal reflux disease)   . Gout   . Headache    h/o migraines  . History of fracture of patella    right knee  . History of positive PPD    Patient always shows positive  . Hyperlipidemia   . Hypertension   . Hypertensive retinopathy    OU  . Hypothyroidism   . Lichen sclerosus 75/10/2583   of vulva  . Metatarsal fracture   . Neuropathy   . Peripheral  vascular disease ([place])   . Polyneuropathy    numbness and tingling in feet and toes  . Renal insufficiency   . Sleep apnea    does not use cpap-lost weight   . Type 2 diabetes mellitus, uncontrolled ([place])    Past Surgical History:  Procedure Laterality Date  . APPENDECTOMY    . BREAST REDUCTION SURGERY  2001  . CATARACT EXTRACTION    . CESAREAN SECTION  1976  . COLONOSCOPY  03/05/2013   Nml - due for repeat 03/06/2018  .  DILATION AND CURETTAGE OF UTERUS  1989  . ENDARTERECTOMY FEMORAL Bilateral 03/09/2018   Procedure: ENDARTERECTOMY FEMORAL;  Surgeon: Annice Needy, MD;  Location: [facility];  Service: Vascular;  Laterality: Bilateral;  . ENDARTERECTOMY POPLITEAL Left 03/09/2018   Procedure: ENDARTERECTOMY POPLITEAL AND SFA;  Surgeon: Annice Needy, MD;  Location: [facility];  Service: Vascular;  Laterality: Left;  . ESOPHAGOGASTRODUODENOSCOPY  03/05/2013  . EYE SURGERY    . Eyelid Surgery  2012  . INTRAMEDULLARY (IM) NAIL INTERTROCHANTERIC Left 10/30/2015   Procedure: INTRAMEDULLARY (IM) NAIL INTERTROCHANTRIC ;  Surgeon: Kennedy Bucker, MD;  Location: [facility];  Service: Orthopedics;  Laterality: Left;  . KYPHOPLASTY N/A 10/25/2018   Procedure: L4 KYPHOPLASTY;  Surgeon: Kennedy Bucker, MD;  Location: [facility];  Service: Orthopedics;  Laterality: N/A;  . LAPAROSCOPIC HYSTERECTOMY  2000   total  . LOWER EXTREMITY ANGIOGRAPHY Left 03/08/2017   Procedure: LOWER EXTREMITY ANGIOGRAPHY;  Surgeon: Annice Needy, MD;  Location: [facility];  Service: Cardiovascular;  Laterality: Left;  . LOWER EXTREMITY ANGIOGRAPHY Left 10/30/2017   Procedure: LOWER EXTREMITY ANGIOGRAPHY;  Surgeon: Annice Needy, MD;  Location: [facility];  Service: Cardiovascular;  Laterality: Left;  . LOWER EXTREMITY ANGIOGRAPHY Right 03/08/2018   Procedure: LOWER EXTREMITY ANGIOGRAPHY;  Surgeon: Annice Needy, MD;  Location: [facility];  Service: Cardiovascular;  Laterality: Right;  . LOWER EXTREMITY ANGIOGRAPHY  Left 10/01/2018   Procedure: LOWER EXTREMITY ANGIOGRAPHY;  Surgeon: Annice Needy, MD;  Location: [facility];  Service: Cardiovascular;  Laterality: Left;  . LOWER EXTREMITY ANGIOGRAPHY Right 10/08/2018   Procedure: LOWER EXTREMITY ANGIOGRAPHY;  Surgeon: Annice Needy, MD;  Location: [facility];  Service: Cardiovascular;  Laterality: Right;  . PERIPHERAL VASCULAR INTERVENTION  03/08/2018   Procedure: PERIPHERAL VASCULAR INTERVENTION;  Surgeon: Annice Needy, MD;  Location: [facility];  Service: Cardiovascular;;  . REDUCTION MAMMAPLASTY  1997  . SACROPLASTY N/A 10/25/2018   Procedure: S1 SACROPLASTY;  Surgeon: Kennedy Bucker, MD;  Location: [facility];  Service: Orthopedics;  Laterality: N/A;    FAMILY HISTORY Family History  Problem Relation Age of Onset  . Coronary artery disease Father   . Heart attack Father   . Coronary artery disease Mother   . Heart attack Mother   . Ovarian cancer Sister 25       sister had hormonal therapy for IVF txs-which increased risk factor for ovarian cancer  . Breast cancer Neg Hx     SOCIAL HISTORY Social History   Tobacco Use  . Smoking status: Former Smoker    Packs/day: 1.00    Years: 20.00    Pack years: 20.00    Types: Cigarettes    Quit date: 03/07/1996    Years since quitting: 22.7  . Smokeless tobacco: Never Used  . Tobacco comment: started smoking at age 53  Substance Use Topics  . Alcohol use: No    Alcohol/week: 0.0 standard drinks  . Drug use: No         OPHTHALMIC EXAM:  Base Eye Exam    Visual Acuity (Snellen - Linear)      Right Left   Dist [place] 20/40 -1 20/20   Dist ph [place] NI        Tonometry (Tonopen, 2:28 PM)      Right Left   Pressure 19 18       Pupils      Dark Light Shape React APD  Right 2 1 Round Brisk None   Left 2 1 Round Brisk None       Visual Fields (Counting fingers)      Left Right    Full Full       Extraocular Movement      Right Left    Full, Ortho Full, Ortho        Neuro/Psych    Oriented x3: Yes   Mood/Affect: Normal       Dilation    Both eyes: 1.0% Mydriacyl, 2.5% Phenylephrine @ 2:28 PM        Slit Lamp and Fundus Exam    External Exam      Right Left   External Normal Normal       Slit Lamp Exam      Right Left   Lids/Lashes dermatochalasis dermatochalasis   Conjunctiva/Sclera White and quiet White and quiet   Cornea arcus; well healed cataract wound; 1+ Punctate epithelial erosions, decreased TBUT, mild Anterior basement membrane dystrophy superiorly arcus; well healed cataract wound, 1+ Punctate epithelial erosions, irregualr epi surface, decreased TBUT   Anterior Chamber Deep and quiet Deep and quiet   Iris Round and dilated Round and dilated   Lens PCIOL; open PC PCIOL; open PC   Vitreous syneresis, Posterior vitreous detachment, vitreous condensations inferiorly syneresis, Posterior vitreous detachment       Fundus Exam      Right Left   Disc Superior hyperemia and mild edema, +IRH superior disc - persistent Pink and Sharp   C/D Ratio 0.4 0.5   Macula Flat, Blunted foveal reflex, persistent IRF - slight improvement, +Epiretinal membrane flat; blunted foveal reflex, no heme or edema, small pigment clump IT to fovea   Vessels Vascular attenuation, Tortuous Vascular attenuation   Periphery attached; 360 MAs/DBH inferior Attached, no heme        Refraction    Manifest Refraction      Sphere Cylinder Axis Dist VA   Right -0.25 +0.50 092 20/25-2   Left              IMAGING AND PROCEDURES  Imaging and Procedures for 04/25/17  OCT, Retina - OU - Both Eyes       Right Eye Quality was good. Central Foveal Thickness: 366. Progression has improved. Findings include abnormal foveal contour, epiretinal membrane, no SRF, intraretinal fluid (Persistent IRF - slightly improved, +ERM).   Left Eye Quality was good. Central Foveal Thickness: 277. Progression has been stable. Findings include normal foveal contour, no IRF,  no SRF (Trace ERM).   Notes *Images captured and stored on drive  Diagnosis / Impression:  [id number] IRF - slightly improved, +ERM OS: NFP; no IRF/SRF--stable, trace ERM  Clinical management:  See below  Abbreviations: NFP - Normal foveal profile. CME - cystoid macular edema. PED - pigment epithelial detachment. IRF - intraretinal fluid. SRF - subretinal fluid. EZ - ellipsoid zone. ERM - epiretinal membrane. ORA - outer retinal atrophy. ORT - outer retinal tubulation. SRHM - subretinal hyper-reflective material        Intravitreal Injection, Pharmacologic Agent - OD - Right Eye       Time Out 11/20/2018. 4:09 PM. Confirmed correct patient, procedure, site, and patient consented.   Anesthesia Topical anesthesia was used. Anesthetic medications included Proparacaine 0.5%, Lidocaine 2%.   Procedure Preparation included 5% betadine to ocular surface, eyelid speculum. A 30 gauge needle was used.   Injection:  2 mg aflibercept Gretta Cool) SOLN   NDC: L6038910, Lot: [id number],  Expiration date: 05/03/2019   Route: Intravitreal, Site: Right Eye, Waste: 0.05 mL  Post-op Post injection exam found visual acuity of at least counting fingers. The patient tolerated the procedure well. There were no complications. The patient received written and verbal post procedure care education. Post injection medications were not given.                 ASSESSMENT/PLAN:    ICD-10-CM   1. Branch retinal vein occlusion of right eye with macular edema  H34.8310 Intravitreal Injection, Pharmacologic Agent - OD - Right Eye    aflibercept (EYLEA) SOLN 2 mg  2. Retinal edema  H35.81 OCT, Retina - OU - Both Eyes  3. Both eyes affected by mild nonproliferative diabetic retinopathy with macular edema, associated with type 2 diabetes mellitus (HCC)  Z61.0960   4. Essential hypertension  I10   5. Hypertensive retinopathy of both eyes  H35.033   6. Epiretinal membrane (ERM) of right eye  H35.371    7. Pseudophakia of both eyes  Z96.1   8. 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 BRVO w/ CME OD  - by history, pt states symptoms first noticed 2 wks prior to presentation, but reports changes may have occurred prior  - initial exam with differential tortuosity of vessels (OD > OS)  - FA (02.10.20) shows mild late staining / leakage in macula, staining / leakage of disc -- improving CME  - differential includes DM2 (DME), hypertensive retinopathy, inflammatory etiology / uveitis  - S/P IVA OD #1 (02.08.19), #2 (03.11.19), #3 (04.09.19), #4 (05.20.19), #5 (02.10.20)  - review of OCTs show persistent IRF and cystic changes --  resistance to IVA   - June 2019 -- switched therapies: S/P IVE OD #1 (06.24.19), #2 (07.24.19), #3 (09.04.19), #4 (10.30.19),#5 (12.30.19), #6 (03.23.20), #7 (05.05.20), #8 (07.16.20), #9 (07.17.20), #10 (08.28.20), #11 (10.13.20)  - gave IVA OD on 2.10.20 due to pending Eylea4U for 2020 -- resulted in increased IRF/CME  - OCT today shows persistent, slightly decreased IRF and cystic changes OD  - BCVA 20/40 OD today  - Eylea4U benefits investigation for 2020 now completed and pt approved for IVE  - recommend IVE OD #12 (11.17.20)  - RBA of procedure discussed, questions answered  - informed consent obtained and signed  - see procedure note  - F/U 5 weeks  -- DFE/OCT/possible injection  3. Mild nonproliferative diabetic retinopathy, both eyes  - The incidence, risk factors for progression, natural history and treatment options for diabetic retinopathy were discussed with patient.    - The need for close monitoring of blood glucose, blood pressure, and serum lipids, avoiding cigarette or any type of tobacco, and the need for long term follow up was also discussed with patient.  - could be contributing to CME OD  - OS with minimal diabetic retinopathy  - continue to monitor  4,5. Hypertensive retinopathy OU - stable - as above, may have contributing to CME  OD - discussed importance of tight BP control - monitor  6. Epiretinal membrane, right eye   - The natural history, anatomy, potential for loss of vision, and treatment options including vitrectomy techniques and the complications of endophthalmitis, retinal detachment, vitreous hemorrhage, cataract progression and permanent vision loss discussed with the patient.  - stable nasal ERM  - no indication for surgery at this time  7. Pseudophakia OU  - s/p CE/IOL OU by cataract surgeon in [facility]  - doing well  -  monitor  8. Optic disc edema OD -- sectoral  - likely secondary to BRVO but differential includes carotid stenosis and retro-orbital mass  - history of blood clots  - recommend CT orbits w/ contrast to r/o retro-orbital mass -- not obtained  - recommend carotid dopplers to r/o stenosis / occlusion -- pt scheduled for repeat u/s due to recent bilateral femoral endarterectomies -- strong history of peripheral vascular disease   Ophthalmic Meds Ordered this visit:  Meds ordered this encounter  Medications  . aflibercept (EYLEA) SOLN 2 mg       Return in about 5 weeks (around 12/25/2018) for f/u BRVO OD, DFE, OCT.  There are no Patient Instructions on file for this visit.   Explained the diagnoses, plan, and follow up with the patient and they expressed understanding.  Patient expressed understanding of the importance of proper follow up care.   This document serves as a record of services personally performed by Karie Chimera, MD, PhD. It was created on their behalf by Cristopher Estimable, COT an ophthalmic technician. The creation of this record is the provider's dictation and/or activities during the visit.    Electronically signed by: Cristopher Estimable, COT 11/15/18 @ 5:52 PM  Karie Chimera, M.D., Ph.D. Diseases & Surgery of the Retina and Vitreous [facility] 11/20/2018   I have reviewed the above documentation for accuracy and completeness, and I agree  with the above. Karie Chimera, M.D., Ph.D. 11/20/18 5:52 PM    Abbreviations: M myopia (nearsighted); A astigmatism; H hyperopia (farsighted); P presbyopia; Mrx spectacle prescription;  CTL contact lenses; OD right eye; OS left eye; OU both eyes  XT exotropia; ET esotropia; PEK punctate epithelial keratitis; PEE punctate epithelial erosions; DES dry eye syndrome; MGD meibomian gland dysfunction; ATs artificial tears; PFAT's preservative free artificial tears; NSC nuclear sclerotic cataract; PSC posterior subcapsular cataract; ERM epi-retinal membrane; PVD posterior vitreous detachment; RD retinal detachment; DM diabetes mellitus; DR diabetic retinopathy; NPDR non-proliferative diabetic retinopathy; PDR proliferative diabetic retinopathy; CSME clinically significant macular edema; DME diabetic macular edema; dbh dot blot hemorrhages; CWS cotton wool spot; POAG primary open angle glaucoma; C/D cup-to-disc ratio; HVF humphrey visual field; GVF goldmann visual field; OCT optical coherence tomography; IOP intraocular pressure; BRVO Branch retinal vein occlusion; CRVO central retinal vein occlusion; CRAO central retinal artery occlusion; BRAO branch retinal artery occlusion; RT retinal tear; SB scleral buckle; PPV pars plana vitrectomy; VH Vitreous hemorrhage; PRP panretinal laser photocoagulation; IVK intravitreal kenalog; VMT vitreomacular traction; MH Macular hole;  NVD neovascularization of the disc; NVE neovascularization elsewhere; AREDS age related eye disease study; ARMD age related macular degeneration; POAG primary open angle glaucoma; EBMD epithelial/anterior basement membrane dystrophy; ACIOL anterior chamber intraocular lens; IOL intraocular lens; PCIOL posterior chamber intraocular lens; Phaco/IOL phacoemulsification with intraocular lens placement; PRK photorefractive keratectomy; LASIK laser assisted in situ keratomileusis; HTN hypertension; DM diabetes mellitus; COPD chronic obstructive pulmonary  disease

## 2018-11-16 ENCOUNTER — Ambulatory Visit: Payer: PRIVATE HEALTH INSURANCE

## 2018-11-19 ENCOUNTER — Ambulatory Visit: Payer: PRIVATE HEALTH INSURANCE

## 2018-11-20 ENCOUNTER — Other Ambulatory Visit: Payer: Self-pay

## 2018-11-20 ENCOUNTER — Ambulatory Visit (INDEPENDENT_AMBULATORY_CARE_PROVIDER_SITE_OTHER): Payer: Medicare Other | Admitting: Ophthalmology

## 2018-11-20 ENCOUNTER — Encounter (INDEPENDENT_AMBULATORY_CARE_PROVIDER_SITE_OTHER): Payer: Self-pay | Admitting: Ophthalmology

## 2018-11-20 DIAGNOSIS — H4710 Unspecified papilledema: Secondary | ICD-10-CM

## 2018-11-20 DIAGNOSIS — H34831 Tributary (branch) retinal vein occlusion, right eye, with macular edema: Secondary | ICD-10-CM | POA: Diagnosis not present

## 2018-11-20 DIAGNOSIS — H3581 Retinal edema: Secondary | ICD-10-CM

## 2018-11-20 DIAGNOSIS — I10 Essential (primary) hypertension: Secondary | ICD-10-CM | POA: Diagnosis not present

## 2018-11-20 DIAGNOSIS — H35033 Hypertensive retinopathy, bilateral: Secondary | ICD-10-CM

## 2018-11-20 DIAGNOSIS — E113213 Type 2 diabetes mellitus with mild nonproliferative diabetic retinopathy with macular edema, bilateral: Secondary | ICD-10-CM | POA: Diagnosis not present

## 2018-11-20 DIAGNOSIS — H35371 Puckering of macula, right eye: Secondary | ICD-10-CM

## 2018-11-20 DIAGNOSIS — Z961 Presence of intraocular lens: Secondary | ICD-10-CM

## 2018-11-20 MED ORDER — AFLIBERCEPT 2MG/0.05ML IZ SOLN FOR KALEIDOSCOPE
2.0000 mg | INTRAVITREAL | Status: AC | PRN
  Administered 2018-11-20: 2 mg via INTRAVITREAL

## 2018-11-23 ENCOUNTER — Other Ambulatory Visit (INDEPENDENT_AMBULATORY_CARE_PROVIDER_SITE_OTHER): Payer: Self-pay | Admitting: Nurse Practitioner

## 2018-11-23 ENCOUNTER — Telehealth (INDEPENDENT_AMBULATORY_CARE_PROVIDER_SITE_OTHER): Payer: Self-pay | Admitting: Vascular Surgery

## 2018-11-23 DIAGNOSIS — R231 Pallor: Secondary | ICD-10-CM

## 2018-11-23 DIAGNOSIS — Z95820 Peripheral vascular angioplasty status with implants and grafts: Secondary | ICD-10-CM

## 2018-11-23 DIAGNOSIS — R209 Unspecified disturbances of skin sensation: Secondary | ICD-10-CM

## 2018-11-23 NOTE — Telephone Encounter (Signed)
Patient can come in for ABIs only.  If it's abnormal I will work her in. If it is similar to the ultrasound that she had a month ago, she can go home and keep her f/u in February

## 2018-11-26 ENCOUNTER — Ambulatory Visit (INDEPENDENT_AMBULATORY_CARE_PROVIDER_SITE_OTHER): Payer: Medicare Other

## 2018-11-26 ENCOUNTER — Other Ambulatory Visit: Payer: Self-pay

## 2018-11-26 ENCOUNTER — Ambulatory Visit
Admission: RE | Admit: 2018-11-26 | Discharge: 2018-11-26 | Disposition: A | Discharge status: Home / Self Care | Payer: Medicare Other | Source: Ambulatory Visit | Attending: Internal Medicine | Admitting: Internal Medicine

## 2018-11-26 DIAGNOSIS — D500 Iron deficiency anemia secondary to blood loss (chronic): Secondary | ICD-10-CM | POA: Insufficient documentation

## 2018-11-26 DIAGNOSIS — R231 Pallor: Secondary | ICD-10-CM | POA: Diagnosis not present

## 2018-11-26 DIAGNOSIS — Z95820 Peripheral vascular angioplasty status with implants and grafts: Secondary | ICD-10-CM | POA: Diagnosis not present

## 2018-11-26 DIAGNOSIS — R209 Unspecified disturbances of skin sensation: Secondary | ICD-10-CM

## 2018-11-26 DIAGNOSIS — R1084 Generalized abdominal pain: Secondary | ICD-10-CM | POA: Insufficient documentation

## 2018-11-26 IMAGING — CT CT ABD-PELV W/ CM
2 of 5 series · 16 of 46 positions shown, 18 images · IV contrast (omnipaque)
Comparison: None

CLINICAL DATA: Anemia, chronic blood loss, type II diabetes
mellitus, GERD, hypertension

EXAM:
CT ABDOMEN AND PELVIS WITH CONTRAST
TECHNIQUE: Multidetector CT imaging of the abdomen and pelvis was performed
using the standard protocol following bolus administration of
intravenous contrast. Sagittal and coronal MPR images reconstructed
from axial data set.
CONTRAST:  75mL OMNIPAQUE IOHEXOL 300 MG/ML SOLN IV. Dilute oral
contrast.

[Series 2: abd pelvis 5.00 · axial · 0.80mm/px · z∈[-1477,-1077]mm · 13 of 91 slices shown, 15 images]
[im 6/91  soft-tissue]
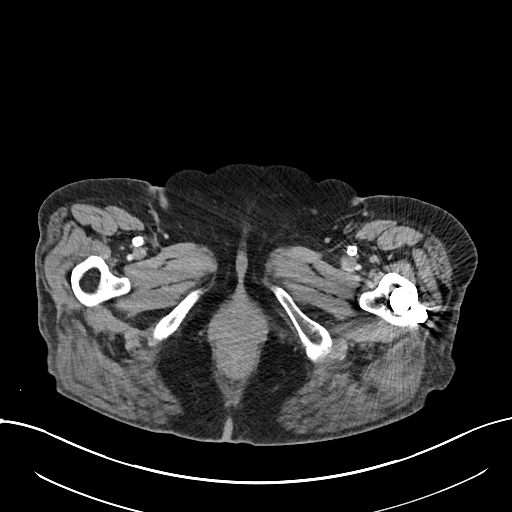
[im 6/91  bone]
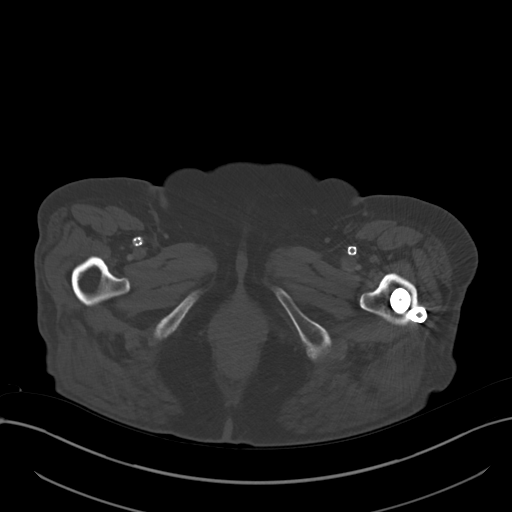
[im 11/91  soft-tissue]
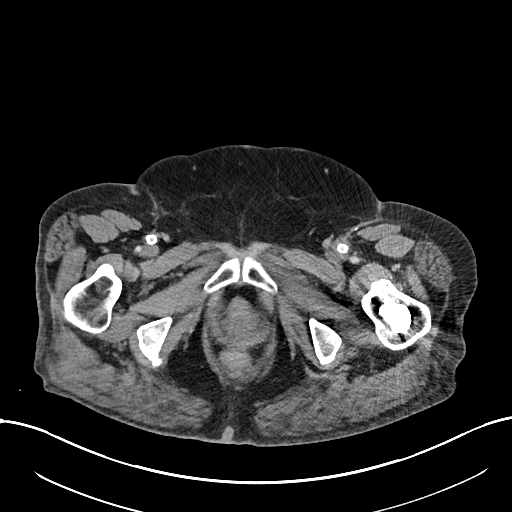
[im 21/91  soft-tissue]
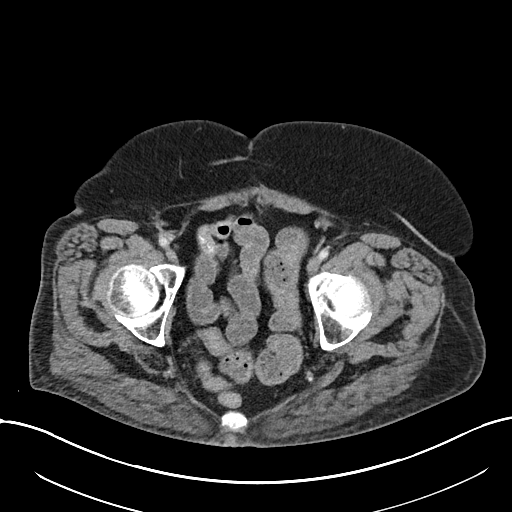
[im 26/91  soft-tissue]
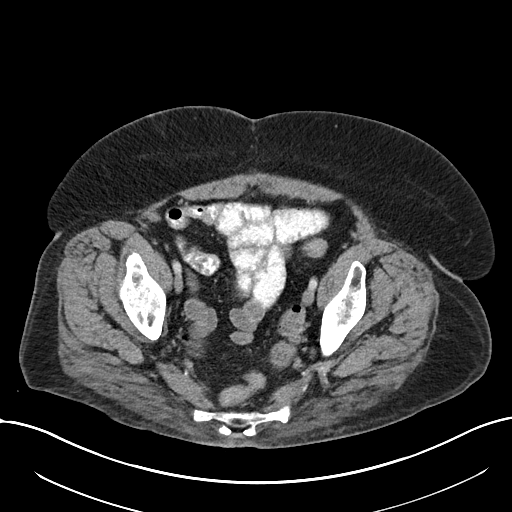
[im 31/91  soft-tissue]
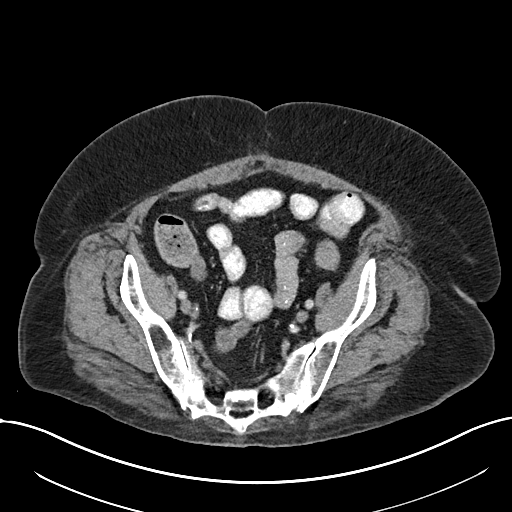
[im 41/91  soft-tissue]
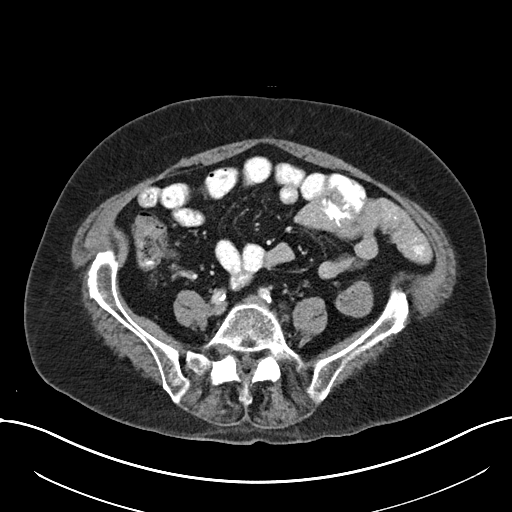
[im 46/91  soft-tissue]
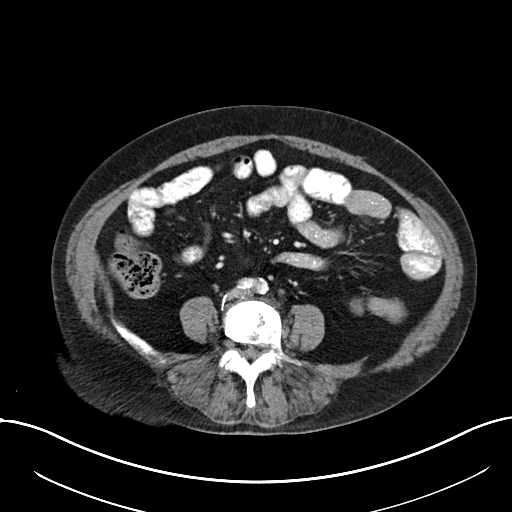
[im 51/91  soft-tissue]
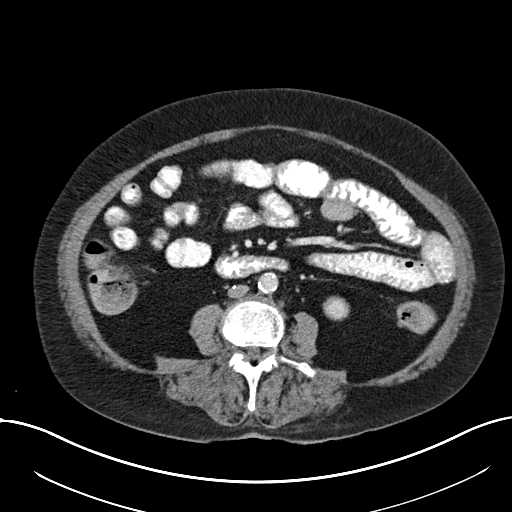
[im 61/91  soft-tissue]
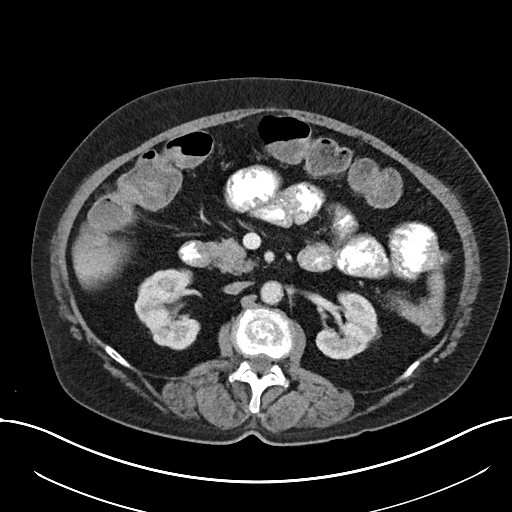
[im 61/91  bone]
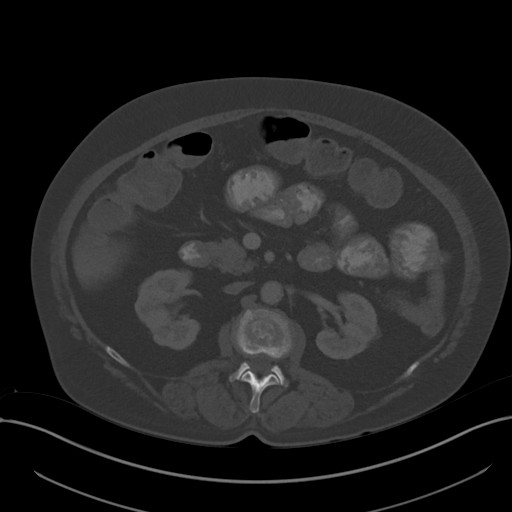
[im 66/91  soft-tissue]
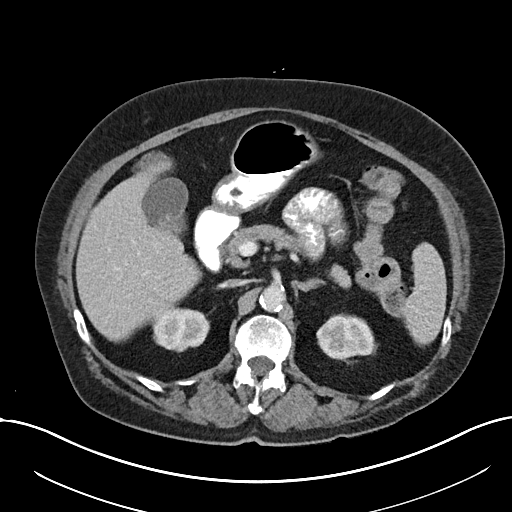
[im 71/91  soft-tissue]
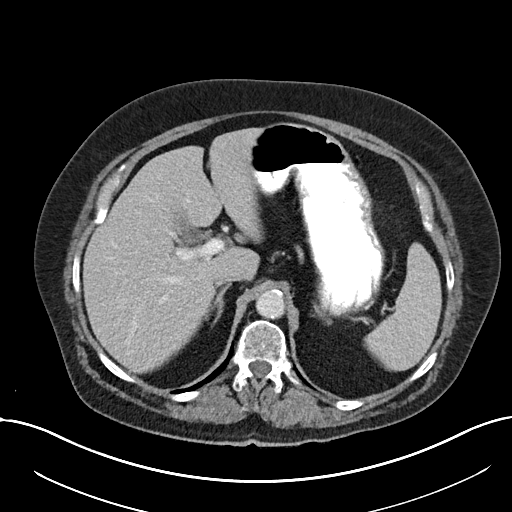
[im 81/91  soft-tissue]
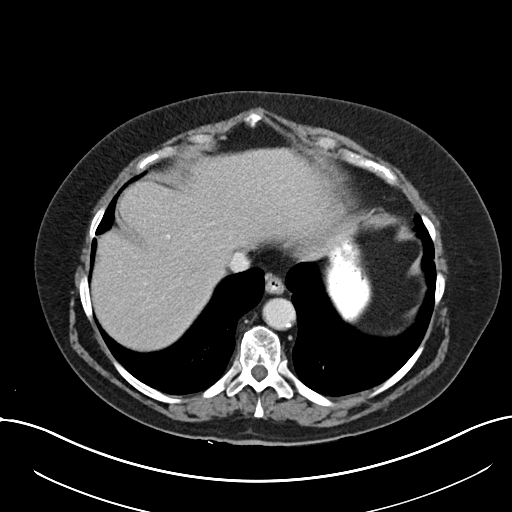
[im 86/91  soft-tissue]
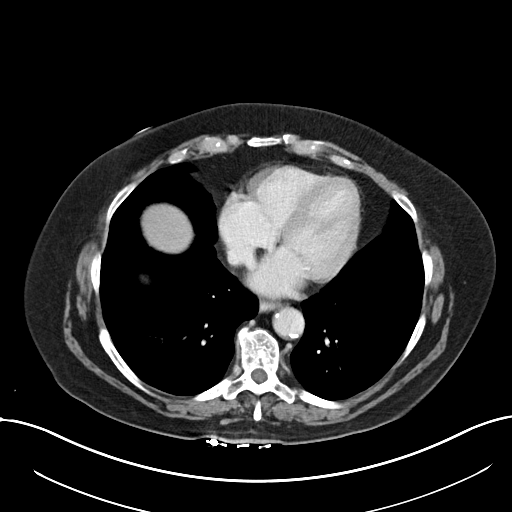

[Series 4: coronals abd pelvis 2.00 cor · coronal · 0.80mm/px · 3 of 204 slices shown]
[im 68/204  soft-tissue]
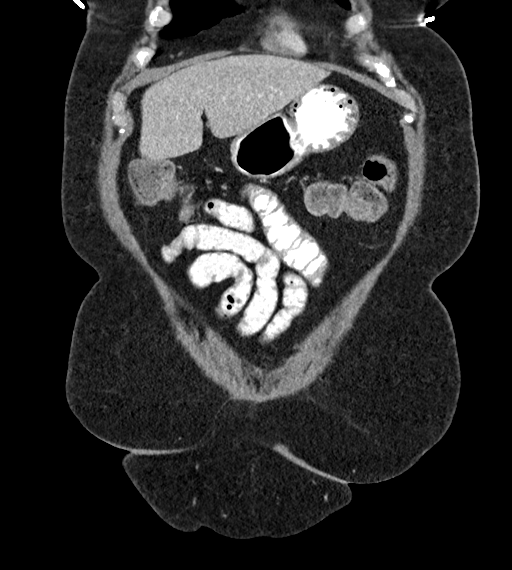
[im 91/204  soft-tissue]
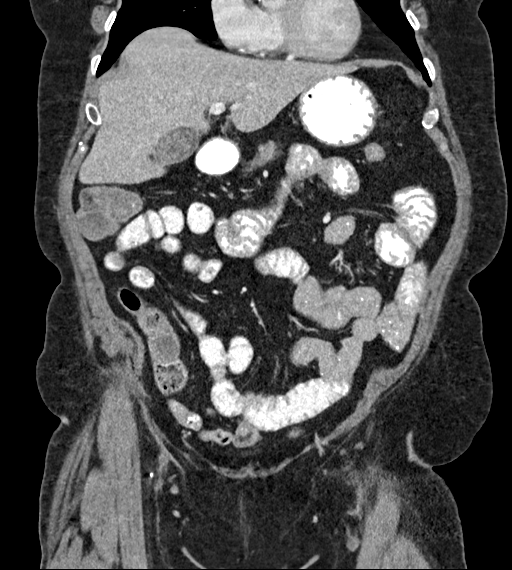
[im 113/204  soft-tissue]
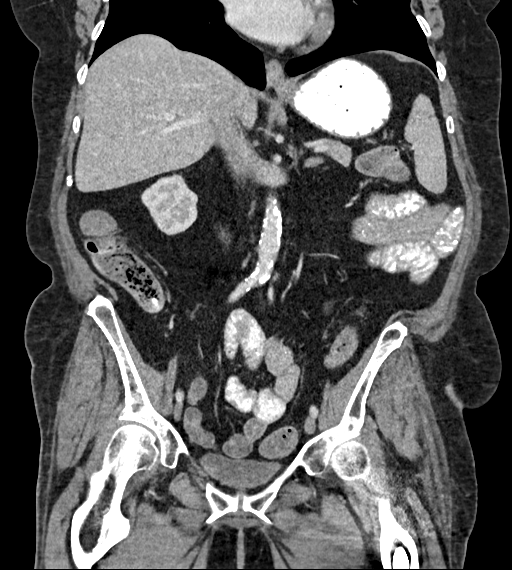

[16 of 46 positions shown; findings below may reference images not displayed]

FINDINGS: Lower chest: Visualized lung bases clear abnormal material within
gallbladder lumen likely

Hepatobiliary: Representing noncalcified gallstones. No gallbladder
wall thickening. Liver unremarkable.

Pancreas: Normal appearance

Spleen: Normal appearance

Adrenals/Urinary Tract: Adrenal glands normal appearance. Lobular
renal contours and slight renal cortical thinning without mass or
hydronephrosis. No urinary tract calcification or dilatation.
Ureters and bladder unremarkable.

Stomach/Bowel: Minimal distal colonic diverticulosis without
evidence of diverticulitis. Appendix surgically absent by history.
Stomach and bowel loops otherwise normal appearance.

Vascular/Lymphatic: Atherosclerotic calcifications aorta, iliac
arteries, coronary arteries.

Reproductive: Uterus surgically absent with nonvisualization of
ovaries.

Other: No free air or free fluid. BILATERAL inguinal hernias
containing fat.

Musculoskeletal: Osseous demineralization. Orthopedic hardware
proximal LEFT femur. Prior BILATERAL sacroplasties and L4 spinal
augmentation procedure. Old compression fractures at superior
endplates of L3 and L4.
IMPRESSION: Cholelithiasis.

Minimal distal colonic diverticulosis.

BILATERAL inguinal hernias containing fat.

No acute intra-abdominal or intrapelvic abnormalities.

Aortic Atherosclerosis ([GN]-[GN]).

## 2018-11-26 MED ORDER — IOHEXOL 300 MG/ML  SOLN
75.0000 mL | Freq: Once | INTRAMUSCULAR | Status: AC | PRN
  Administered 2018-11-26: 75 mL via INTRAVENOUS

## 2018-12-06 ENCOUNTER — Other Ambulatory Visit: Admission: RE | Admit: 2018-12-06 | Payer: PRIVATE HEALTH INSURANCE | Source: Ambulatory Visit

## 2018-12-17 ENCOUNTER — Other Ambulatory Visit (INDEPENDENT_AMBULATORY_CARE_PROVIDER_SITE_OTHER): Payer: Self-pay | Admitting: Vascular Surgery

## 2018-12-25 ENCOUNTER — Encounter (INDEPENDENT_AMBULATORY_CARE_PROVIDER_SITE_OTHER): Payer: Medicare Other | Admitting: Ophthalmology

## 2018-12-25 ENCOUNTER — Encounter (INDEPENDENT_AMBULATORY_CARE_PROVIDER_SITE_OTHER): Payer: Self-pay | Admitting: Ophthalmology

## 2019-01-24 ENCOUNTER — Encounter (INDEPENDENT_AMBULATORY_CARE_PROVIDER_SITE_OTHER): Payer: Self-pay | Admitting: Ophthalmology

## 2019-01-28 ENCOUNTER — Encounter (INDEPENDENT_AMBULATORY_CARE_PROVIDER_SITE_OTHER): Payer: Medicare Other | Admitting: Ophthalmology

## 2019-01-29 NOTE — Progress Notes (Addendum)
[facility] Note  02/11/2019     CHIEF COMPLAINT Patient presents for Retina Follow Up   HISTORY OF PRESENT ILLNESS: Cheryl Leonard is a 74 y.o. female who presents to the clinic today for:   HPI    Retina Follow Up    Patient presents with  CRVO/BRVO.  In right eye.  This started months ago.  Severity is moderate.  Duration of 12 weeks.  Since onset it is gradually worsening.  I, the attending physician,  performed the HPI with the patient and updated documentation appropriately.          Comments    74 y/o female pt here for 12 wk f/u for BRVO w/mac edema OD.  No change in [place] noticed OU.  Denies pain, flashes, floaters.  No gtts.  BS 115 this a.m.  A1C 7.0 2 mos ago.  Occasionally in the afternoons will have short bouts of horizontal diplopia.       Last edited by Bernarda Caffey, MD on 02/11/2019 12:16 PM. (History)    Patient is late to f/u 11 weeks instead of 5 weeks due to transportation. Patient states no changes in vision, but did notice that her vision was blurry when we checked vision OD in the office today.   Referring physician: Rusty Aus, MD [place],  [place] [address]  HISTORICAL INFORMATION:   Selected notes from the MEDICAL RECORD NUMBER Referred by Dr. Marvel Plan for concern of DME OD Lab Results  Component Value Date   HGBA1C 5.9 (H) 10/29/2015       CURRENT MEDICATIONS: Current Outpatient Medications (Ophthalmic Drugs)  Medication Sig  . Aflibercept (EYLEA) 2 MG/0.05ML SOLN 2 mg by Intravitreal route every 5 (five) weeks.    Current Facility-Administered Medications (Ophthalmic Drugs)  Medication Route  . aflibercept (EYLEA) SOLN 2 mg Intravitreal  . aflibercept (EYLEA) SOLN 2 mg Intravitreal  . aflibercept (EYLEA) SOLN 2 mg Intravitreal  . aflibercept (EYLEA) SOLN 2 mg Intravitreal  . aflibercept (EYLEA) SOLN 2 mg Intravitreal   Current Outpatient  Medications (Other)  Medication Sig  . acetaminophen (TYLENOL) 500 MG tablet Take 1,000 mg by mouth every 6 (six) hours as needed for moderate pain or headache.  . ALPRAZolam (XANAX) 0.25 MG tablet Take 0.25 mg by mouth daily as needed for anxiety or sleep.   . Ascorbic Acid (VITAMIN C) 1000 MG tablet Take 1,000 mg by mouth daily.  [name] aspirin EC 81 MG tablet Take 81 mg by mouth daily.   [name] buPROPion (WELLBUTRIN XL) 150 MG 24 hr tablet Take 150 mg by mouth daily.   [name] BYSTOLIC 5 MG tablet Take 5 mg by mouth every morning.   . calcium carbonate (TUMS EX) 750 MG chewable tablet Chew 2,250 mg by mouth daily as needed for heartburn.  . chlorhexidine (PERIDEX) 0.12 % solution Use as directed 15 mLs in the mouth or throat 2 (two) times daily.   . cholecalciferol (VITAMIN D) 1000 units tablet Take 1,000 Units by mouth 2 (two) times daily.  [name] CORAL CALCIUM PO Take 1 tablet by mouth 2 (two) times daily.   [name] denosumab (PROLIA) 60 MG/ML SOLN injection Inject 60 mg into the skin every 6 (six) months.   . DENTA 5000 PLUS 1.1 % CREA dental cream Place 1 application onto teeth 2 (two) times daily. as directed  . DULoxetine (CYMBALTA) 60 MG capsule Take 1 capsule (60 mg total)  by mouth 2 (two) times daily.  [name] ELIQUIS 5 MG TABS tablet TAKE 1 TABLET BY MOUTH TWICE A DAY  . famotidine (PEPCID) 40 MG tablet Take 40 mg by mouth 2 (two) times daily.   . ferrous gluconate (FERGON) 324 MG tablet Take 324 mg by mouth daily with breakfast.  . furosemide (LASIX) 20 MG tablet Take 20 mg by mouth daily as needed for fluid or edema.   . Insulin Pen Needle (B-D UF III MINI PEN NEEDLES) 31G X 5 MM MISC once daily.  [name] levothyroxine (SYNTHROID, LEVOTHROID) 100 MCG tablet Take 100 mcg by mouth daily before breakfast.   . lisinopril (PRINIVIL,ZESTRIL) 10 MG tablet Take 10 mg by mouth every morning.   . Magnesium 500 MG TABS Take 500 mg by mouth every morning.   . metFORMIN (GLUCOPHAGE) 1000 MG tablet Take 1,000 mg by mouth 2 (two)  times daily.  . metoprolol succinate (TOPROL-XL) 50 MG 24 hr tablet Take by mouth.  . Multiple Vitamin (MULTIVITAMIN WITH MINERALS) TABS tablet Take 1 tablet by mouth daily.  . pantoprazole (PROTONIX) 40 MG tablet Take 40 mg by mouth every morning.   . rosuvastatin (CRESTOR) 20 MG tablet Take 20 mg by mouth every morning.   . traMADol (ULTRAM) 50 MG tablet Take 1 tablet (50 mg total) by mouth every 6 (six) hours as needed.  . TRESIBA FLEXTOUCH 200 UNIT/ML SOPN Inject 30 Units as directed at bedtime.   . TYMLOS 3120 MCG/1.56ML SOPN   . VASCEPA 1 g CAPS Take 1 capsule by mouth 2 (two) times daily.  . vitamin B-12 (CYANOCOBALAMIN) 1000 MCG tablet Take 1,000 mcg by mouth daily.  . vitamin E 400 UNIT capsule Take 400 Units by mouth daily.  [name] zolpidem (AMBIEN) 5 MG tablet Take 1 tablet (5 mg total) by mouth at bedtime. (Patient taking differently: Take 5 mg by mouth at bedtime as needed. )   Current Facility-Administered Medications (Other)  Medication Route  . Bevacizumab (AVASTIN) SOLN 1.25 mg Intravitreal  . Bevacizumab (AVASTIN) SOLN 1.25 mg Intravitreal  . Bevacizumab (AVASTIN) SOLN 1.25 mg Intravitreal  . Bevacizumab (AVASTIN) SOLN 1.25 mg Intravitreal  . insulin aspart (novoLOG) injection 0-15 Units Subcutaneous  . insulin aspart (novoLOG) injection 0-5 Units Subcutaneous      REVIEW OF SYSTEMS: ROS    Positive for: Musculoskeletal, Endocrine, Cardiovascular, Eyes   Negative for: Constitutional, Gastrointestinal, Neurological, Skin, Genitourinary, HENT, Respiratory, Psychiatric, Allergic/Imm, Heme/Lymph   Last edited by Matthew Folks, COA on 02/11/2019  9:42 AM. (History)       ALLERGIES No Known Allergies  PAST MEDICAL HISTORY Past Medical History:  Diagnosis Date  . Anemia   . Anxiety   . Arthritis   . Cataracts, both eyes   . Diabetic retinopathy ([place])    NPDR OU  . GERD (gastroesophageal reflux disease)   . Gout   . Headache    h/o migraines  . History of  fracture of patella    right knee  . History of positive PPD    Patient always shows positive  . Hyperlipidemia   . Hypertension   . Hypertensive retinopathy    OU  . Hypothyroidism   . Lichen sclerosus 25/05/3974   of vulva  . Metatarsal fracture   . Neuropathy   . Peripheral vascular disease ([place])   . Polyneuropathy    numbness and tingling in feet and toes  . Renal insufficiency   . Sleep apnea    does not use  cpap-lost weight   . Type 2 diabetes mellitus, uncontrolled ([place])    Past Surgical History:  Procedure Laterality Date  . APPENDECTOMY    . BREAST REDUCTION SURGERY  2001  . CATARACT EXTRACTION    . CESAREAN SECTION  1976  . COLONOSCOPY  03/05/2013   Nml - due for repeat 03/06/2018  . DILATION AND CURETTAGE OF UTERUS  1989  . ENDARTERECTOMY FEMORAL Bilateral 03/09/2018   Procedure: ENDARTERECTOMY FEMORAL;  Surgeon: Algernon Huxley, MD;  Location: [facility];  Service: Vascular;  Laterality: Bilateral;  . ENDARTERECTOMY POPLITEAL Left 03/09/2018   Procedure: ENDARTERECTOMY POPLITEAL AND SFA;  Surgeon: Algernon Huxley, MD;  Location: [facility];  Service: Vascular;  Laterality: Left;  . ESOPHAGOGASTRODUODENOSCOPY  03/05/2013  . EYE SURGERY    . Eyelid Surgery  2012  . INTRAMEDULLARY (IM) NAIL INTERTROCHANTERIC Left 10/30/2015   Procedure: INTRAMEDULLARY (IM) NAIL INTERTROCHANTRIC ;  Surgeon: Hessie Knows, MD;  Location: [facility];  Service: Orthopedics;  Laterality: Left;  . KYPHOPLASTY N/A 10/25/2018   Procedure: L4 KYPHOPLASTY;  Surgeon: Hessie Knows, MD;  Location: [facility];  Service: Orthopedics;  Laterality: N/A;  . LAPAROSCOPIC HYSTERECTOMY  2000   total  . LOWER EXTREMITY ANGIOGRAPHY Left 03/08/2017   Procedure: LOWER EXTREMITY ANGIOGRAPHY;  Surgeon: Algernon Huxley, MD;  Location: [facility];  Service: Cardiovascular;  Laterality: Left;  . LOWER EXTREMITY ANGIOGRAPHY Left 10/30/2017   Procedure: LOWER EXTREMITY ANGIOGRAPHY;  Surgeon: Algernon Huxley, MD;  Location: [facility];  Service: Cardiovascular;  Laterality: Left;  . LOWER EXTREMITY ANGIOGRAPHY Right 03/08/2018   Procedure: LOWER EXTREMITY ANGIOGRAPHY;  Surgeon: Algernon Huxley, MD;  Location: [facility];  Service: Cardiovascular;  Laterality: Right;  . LOWER EXTREMITY ANGIOGRAPHY Left 10/01/2018   Procedure: LOWER EXTREMITY ANGIOGRAPHY;  Surgeon: Algernon Huxley, MD;  Location: [facility];  Service: Cardiovascular;  Laterality: Left;  . LOWER EXTREMITY ANGIOGRAPHY Right 10/08/2018   Procedure: LOWER EXTREMITY ANGIOGRAPHY;  Surgeon: Algernon Huxley, MD;  Location: [facility];  Service: Cardiovascular;  Laterality: Right;  . PERIPHERAL VASCULAR INTERVENTION  03/08/2018   Procedure: PERIPHERAL VASCULAR INTERVENTION;  Surgeon: Algernon Huxley, MD;  Location: [facility];  Service: Cardiovascular;;  . REDUCTION MAMMAPLASTY  1997  . SACROPLASTY N/A 10/25/2018   Procedure: S1 SACROPLASTY;  Surgeon: Hessie Knows, MD;  Location: [facility];  Service: Orthopedics;  Laterality: N/A;    FAMILY HISTORY Family History  Problem Relation Age of Onset  . Coronary artery disease Father   . Heart attack Father   . Coronary artery disease Mother   . Heart attack Mother   . Ovarian cancer Sister 61       sister had hormonal therapy for IVF txs-which increased risk factor for ovarian cancer  . Breast cancer Neg Hx     SOCIAL HISTORY Social History   Tobacco Use  . Smoking status: Former Smoker    Packs/day: 1.00    Years: 20.00    Pack years: 20.00    Types: Cigarettes    Quit date: 03/07/1996    Years since quitting: 22.9  . Smokeless tobacco: Never Used  . Tobacco comment: started smoking at age 65  Substance Use Topics  . Alcohol use: No    Alcohol/week: 0.0 standard drinks  . Drug use: No         OPHTHALMIC EXAM:  Base Eye Exam    Visual Acuity (Snellen - Linear)  Right Left   Dist [place] 20/80 + 20/20   Dist ph [place] NI        Tonometry (Tonopen, 9:44 AM)       Right Left   Pressure 16 18       Pupils      Dark Light Shape React APD   Right 2 1 Round Brisk None   Left 2 1 Round Brisk None       Visual Fields (Counting fingers)      Left Right    Full Full       Extraocular Movement      Right Left    Full, Ortho Full, Ortho       Neuro/Psych    Oriented x3: Yes   Mood/Affect: Normal       Dilation    Both eyes: 1.0% Mydriacyl, 2.5% Phenylephrine @ 9:44 AM        Slit Lamp and Fundus Exam    External Exam      Right Left   External Normal Normal       Slit Lamp Exam      Right Left   Lids/Lashes dermatochalasis dermatochalasis   Conjunctiva/Sclera White and quiet White and quiet   Cornea arcus; well healed cataract wound; 1-2+ Punctate epithelial erosions, decreased TBUT, mild Anterior basement membrane dystrophy superiorly arcus; well healed cataract wound, 1+ Punctate epithelial erosions, irregualr epi surface, decreased TBUT   Anterior Chamber Deep and quiet Deep and quiet   Iris Round and dilated Round and dilated   Lens PCIOL; open PC PCIOL; open PC   Vitreous syneresis, Posterior vitreous detachment, vitreous condensations inferiorly syneresis, Posterior vitreous detachment       Fundus Exam      Right Left   Disc Superior hyperemia and mild edema, +IRH superior disc - persistent Pink and Sharp   C/D Ratio 0.4 0.5   Macula Flat, Blunted foveal reflex, interval increase in central CME/IRF, +Epiretinal membrane flat; blunted foveal reflex, no heme or edema, small pigment clump IT to fovea   Vessels Vascular attenuation, Tortuous Vascular attenuation   Periphery attached; 360 MAs/DBH inferior Attached, no heme        Refraction    Manifest Refraction   Unable to improve VA w/lenses OD.          IMAGING AND PROCEDURES  Imaging and Procedures for 04/25/17  OCT, Retina - OU - Both Eyes       Right Eye Quality was good. Central Foveal Thickness: 683. Progression has worsened. Findings include abnormal  foveal contour, epiretinal membrane, intraretinal fluid, no SRF (Persistent IRF - slightly improved, +ERM).   Left Eye Quality was good. Central Foveal Thickness: 283. Progression has been stable. Findings include normal foveal contour, no IRF, no SRF (Trace ERM).   Notes *Images captured and stored on drive  Diagnosis / Impression:  OD: interval worsening of IRF and CME, +ERM OS: NFP; no IRF/SRF--stable, trace ERM  Clinical management:  See below  Abbreviations: NFP - Normal foveal profile. CME - cystoid macular edema. PED - pigment epithelial detachment. IRF - intraretinal fluid. SRF - subretinal fluid. EZ - ellipsoid zone. ERM - epiretinal membrane. ORA - outer retinal atrophy. ORT - outer retinal tubulation. SRHM - subretinal hyper-reflective material        Intravitreal Injection, Pharmacologic Agent - OD - Right Eye       Time Out 02/11/2019. 10:03 AM. Confirmed correct patient, procedure, site, and patient consented.   Anesthesia Topical  anesthesia was used. Anesthetic medications included Lidocaine 2%, Proparacaine 0.5%.   Procedure Preparation included 5% betadine to ocular surface, eyelid speculum. A supplied (32 g) needle was used.   Injection:  2 mg aflibercept Alfonse Flavors) SOLN   NDC: A3590391, Lot: [id number], Expiration date: 07/24/2019   Route: Intravitreal, Site: Right Eye, Waste: 0.05 mL  Post-op Post injection exam found visual acuity of at least counting fingers. The patient tolerated the procedure well. There were no complications. The patient received written and verbal post procedure care education.                 ASSESSMENT/PLAN:    ICD-10-CM   1. Branch retinal vein occlusion of right eye with macular edema  H34.8310 Intravitreal Injection, Pharmacologic Agent - OD - Right Eye    aflibercept (EYLEA) SOLN 2 mg  2. Retinal edema  H35.81 OCT, Retina - OU - Both Eyes  3. Both eyes affected by mild nonproliferative diabetic retinopathy with  macular edema, associated with type 2 diabetes mellitus ([place])  T77.1165   4. Essential hypertension  I10   5. Hypertensive retinopathy of both eyes  H35.033   6. Epiretinal membrane (ERM) of right eye  H35.371   7. Pseudophakia of both eyes  Z96.1   8. 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 Floaters in visual field, right  H43.391     1,2. BRVO w/ CME OD  - by history, pt states symptoms first noticed 2 wks prior to presentation, but reports changes may have occurred prior  - initial exam with differential tortuosity of vessels (OD > OS)  - FA (02.10.20) shows mild late staining / leakage in macula, staining / leakage of disc -- improving CME  - differential includes DM2 (DME), hypertensive retinopathy, inflammatory etiology / uveitis  - S/P IVA OD #1 (02.08.19), #2 (03.11.19), #3 (04.09.19), #4 (05.20.19), #5 (02.10.20)  - review of OCTs show persistent IRF and cystic changes --  resistance to IVA   - June 2019 -- switched therapies: S/P IVE OD #1 (06.24.19), #2 (07.24.19), #3 (09.04.19), #4 (10.30.19),#5 (12.30.19), #6 (03.23.20), #7 (05.05.20), #8 (07.16.20), #9 (07.17.20), #10 (08.28.20), #11 (10.13.20), # 12 (11.17.20)  - gave IVA OD on 2.10.20 due to pending Eylea4U for 2020 -- resulted in increased IRF/CME  - today, f/u delayed from 5 wks to 11 due to COVID-19  - OCT today shows interval worsening of IRF and CME  - BCVA worse at 20/80+ (down from 20/25-2)  - Eylea4U benefits investigation for 2021 now completed and pt approved for IVE  - recommend IVE OD #13 (02.08.21)  - RBA of procedure discussed, questions answered  - informed consent obtained  - see procedure note  - Eylea informed consent form obtained and scanned on 11.19.2020  - F/U 4 weeks  -- DFE/OCT/possible injection  3. Mild nonproliferative diabetic retinopathy, both eyes  - The incidence, risk factors for progression, natural history and treatment options for diabetic retinopathy were discussed with patient.     - The need for close monitoring of blood glucose, blood pressure, and serum lipids, avoiding cigarette or any type of tobacco, and the need for long term follow up was also discussed with patient.  - could be contributing to CME OD  - OS with minimal diabetic retinopathy  - continue to monitor  4,5. Hypertensive retinopathy OU - stable - as above, may have contributing to CME OD - discussed importance of tight BP control - monitor  6. Epiretinal membrane, right eye   - The natural history, anatomy, potential for loss of vision, and treatment options including vitrectomy techniques and the complications of endophthalmitis, retinal detachment, vitreous hemorrhage, cataract progression and permanent vision loss discussed with the patient.  - stable nasal ERM  - no indication for surgery at this time  7. Pseudophakia OU  - s/p CE/IOL OU by cataract surgeon in [facility]  - doing well  - monitor  8. Optic disc edema OD -- sectoral  - likely secondary to BRVO but differential includes carotid stenosis and retro-orbital mass  - history of blood clots  - recommend CT orbits w/ contrast to r/o retro-orbital mass -- not obtained  - recommend carotid dopplers to r/o stenosis / occlusion -- pt scheduled for repeat u/s due to recent bilateral femoral endarterectomies -- strong history of peripheral vascular disease   Ophthalmic Meds Ordered this visit:  Meds ordered this encounter  Medications  . aflibercept (EYLEA) SOLN 2 mg       Return in about 4 weeks (around 03/11/2019) for DFE, OCT.  There are no Patient Instructions on file for this visit.   Explained the diagnoses, plan, and follow up with the patient and they expressed understanding.  Patient expressed understanding of the importance of proper follow up care.   This document serves as a record of services personally performed by Gardiner Sleeper, MD, PhD. It was created on their behalf by Roselee Nova, COMT. The creation of this record  is the provider's dictation and/or activities during the visit.  Electronically signed by: Roselee Nova, COMT 02/11/19 12:31 PM   Gardiner Sleeper, M.D., Ph.D. Diseases & Surgery of the Retina and [place] 02/11/2019   I have reviewed the above documentation for accuracy and completeness, and I agree with the above. Gardiner Sleeper, M.D., Ph.D. 02/11/19 12:31 PM     Abbreviations: M myopia (nearsighted); A astigmatism; H hyperopia (farsighted); P presbyopia; Mrx spectacle prescription;  CTL contact lenses; OD right eye; OS left eye; OU both eyes  XT exotropia; ET esotropia; PEK punctate epithelial keratitis; PEE punctate epithelial erosions; DES dry eye syndrome; MGD meibomian gland dysfunction; ATs artificial tears; PFAT's preservative free artificial tears; [place] nuclear sclerotic cataract; PSC posterior subcapsular cataract; ERM epi-retinal membrane; PVD posterior vitreous detachment; RD retinal detachment; DM diabetes mellitus; DR diabetic retinopathy; NPDR non-proliferative diabetic retinopathy; PDR proliferative diabetic retinopathy; CSME clinically significant macular edema; DME diabetic macular edema; dbh dot blot hemorrhages; CWS cotton wool spot; POAG primary open angle glaucoma; C/D cup-to-disc ratio; HVF humphrey visual field; GVF goldmann visual field; OCT optical coherence tomography; IOP intraocular pressure; BRVO Branch retinal vein occlusion; CRVO central retinal vein occlusion; CRAO central retinal artery occlusion; BRAO branch retinal artery occlusion; RT retinal tear; SB scleral buckle; PPV pars plana vitrectomy; VH Vitreous hemorrhage; PRP panretinal laser photocoagulation; IVK intravitreal kenalog; VMT vitreomacular traction; MH Macular hole;  NVD neovascularization of the disc; NVE neovascularization elsewhere; AREDS age related eye disease study; ARMD age related macular degeneration; POAG primary open angle glaucoma; EBMD epithelial/anterior  basement membrane dystrophy; ACIOL anterior chamber intraocular lens; IOL intraocular lens; PCIOL posterior chamber intraocular lens; Phaco/IOL phacoemulsification with intraocular lens placement; [place] photorefractive keratectomy; LASIK laser assisted in situ keratomileusis; HTN hypertension; DM diabetes mellitus; COPD chronic obstructive pulmonary disease

## 2019-02-01 ENCOUNTER — Other Ambulatory Visit: Payer: Self-pay

## 2019-02-01 ENCOUNTER — Inpatient Hospital Stay: Payer: Medicare Other | Attending: Internal Medicine | Admitting: Internal Medicine

## 2019-02-01 ENCOUNTER — Inpatient Hospital Stay: Payer: Medicare Other

## 2019-02-01 ENCOUNTER — Encounter: Payer: Self-pay | Admitting: Internal Medicine

## 2019-02-01 DIAGNOSIS — F419 Anxiety disorder, unspecified: Secondary | ICD-10-CM | POA: Diagnosis not present

## 2019-02-01 DIAGNOSIS — N183 Chronic kidney disease, stage 3 unspecified: Secondary | ICD-10-CM | POA: Diagnosis not present

## 2019-02-01 DIAGNOSIS — D649 Anemia, unspecified: Secondary | ICD-10-CM

## 2019-02-01 DIAGNOSIS — G4730 Sleep apnea, unspecified: Secondary | ICD-10-CM | POA: Insufficient documentation

## 2019-02-01 DIAGNOSIS — E1122 Type 2 diabetes mellitus with diabetic chronic kidney disease: Secondary | ICD-10-CM | POA: Diagnosis not present

## 2019-02-01 DIAGNOSIS — I739 Peripheral vascular disease, unspecified: Secondary | ICD-10-CM | POA: Insufficient documentation

## 2019-02-01 DIAGNOSIS — Z7982 Long term (current) use of aspirin: Secondary | ICD-10-CM | POA: Insufficient documentation

## 2019-02-01 DIAGNOSIS — I129 Hypertensive chronic kidney disease with stage 1 through stage 4 chronic kidney disease, or unspecified chronic kidney disease: Secondary | ICD-10-CM | POA: Diagnosis not present

## 2019-02-01 DIAGNOSIS — I7025 Atherosclerosis of native arteries of other extremities with ulceration: Secondary | ICD-10-CM

## 2019-02-01 DIAGNOSIS — D631 Anemia in chronic kidney disease: Secondary | ICD-10-CM

## 2019-02-01 DIAGNOSIS — Z794 Long term (current) use of insulin: Secondary | ICD-10-CM | POA: Insufficient documentation

## 2019-02-01 DIAGNOSIS — K219 Gastro-esophageal reflux disease without esophagitis: Secondary | ICD-10-CM | POA: Diagnosis not present

## 2019-02-01 DIAGNOSIS — M109 Gout, unspecified: Secondary | ICD-10-CM | POA: Diagnosis not present

## 2019-02-01 DIAGNOSIS — E039 Hypothyroidism, unspecified: Secondary | ICD-10-CM | POA: Diagnosis not present

## 2019-02-01 DIAGNOSIS — E785 Hyperlipidemia, unspecified: Secondary | ICD-10-CM | POA: Insufficient documentation

## 2019-02-01 DIAGNOSIS — Z79899 Other long term (current) drug therapy: Secondary | ICD-10-CM | POA: Insufficient documentation

## 2019-02-01 LAB — CBC WITH DIFFERENTIAL/PLATELET
Abs Immature Granulocytes: 0.05 10*3/uL (ref 0.00–0.07)
Basophils Absolute: 0.1 10*3/uL (ref 0.0–0.1)
Basophils Relative: 1 %
Eosinophils Absolute: 0.2 10*3/uL (ref 0.0–0.5)
Eosinophils Relative: 3 %
HCT: 31.1 % — ABNORMAL LOW (ref 36.0–46.0)
Hemoglobin: 9 g/dL — ABNORMAL LOW (ref 12.0–15.0)
Immature Granulocytes: 1 %
Lymphocytes Relative: 18 %
Lymphs Abs: 1.4 10*3/uL (ref 0.7–4.0)
MCH: 24.3 pg — ABNORMAL LOW (ref 26.0–34.0)
MCHC: 28.9 g/dL — ABNORMAL LOW (ref 30.0–36.0)
MCV: 84.1 fL (ref 80.0–100.0)
Monocytes Absolute: 0.5 10*3/uL (ref 0.1–1.0)
Monocytes Relative: 7 %
Neutro Abs: 5.5 10*3/uL (ref 1.7–7.7)
Neutrophils Relative %: 70 %
Platelets: 365 10*3/uL (ref 150–400)
RBC: 3.7 MIL/uL — ABNORMAL LOW (ref 3.87–5.11)
RDW: 18.5 % — ABNORMAL HIGH (ref 11.5–15.5)
WBC: 7.7 10*3/uL (ref 4.0–10.5)
nRBC: 0 % (ref 0.0–0.2)

## 2019-02-01 LAB — COMPREHENSIVE METABOLIC PANEL
ALT: 17 U/L (ref 0–44)
AST: 24 U/L (ref 15–41)
Albumin: 3.9 g/dL (ref 3.5–5.0)
Alkaline Phosphatase: 65 U/L (ref 38–126)
Anion gap: 10 (ref 5–15)
BUN: 23 mg/dL (ref 8–23)
CO2: 22 mmol/L (ref 22–32)
Calcium: 9.2 mg/dL (ref 8.9–10.3)
Chloride: 104 mmol/L (ref 98–111)
Creatinine, Ser: 1.26 mg/dL — ABNORMAL HIGH (ref 0.44–1.00)
GFR calc Af Amer: 49 mL/min — ABNORMAL LOW (ref >60)
GFR calc non Af Amer: 42 mL/min — ABNORMAL LOW (ref >60)
Glucose, Bld: 323 mg/dL — ABNORMAL HIGH (ref 70–99)
Potassium: 4.5 mmol/L (ref 3.5–5.1)
Sodium: 136 mmol/L (ref 135–145)
Total Bilirubin: 0.6 mg/dL (ref 0.3–1.2)
Total Protein: 7.2 g/dL (ref 6.5–8.1)

## 2019-02-01 LAB — IRON AND TIBC
Iron: 32 ug/dL (ref 28–170)
Saturation Ratios: 8 % — ABNORMAL LOW (ref 10.4–31.8)
TIBC: 414 ug/dL (ref 250–450)
UIBC: 382 ug/dL

## 2019-02-01 LAB — TECHNOLOGIST SMEAR REVIEW: Plt Morphology: NORMAL

## 2019-02-01 LAB — RETICULOCYTES
Immature Retic Fract: 26.3 % — ABNORMAL HIGH (ref 2.3–15.9)
RBC.: 3.72 MIL/uL — ABNORMAL LOW (ref 3.87–5.11)
Retic Count, Absolute: 99.3 10*3/uL (ref 19.0–186.0)
Retic Ct Pct: 2.7 % (ref 0.4–3.1)

## 2019-02-01 LAB — FERRITIN: Ferritin: 11 ng/mL (ref 11–307)

## 2019-02-01 LAB — LACTATE DEHYDROGENASE: LDH: 144 U/L (ref 98–192)

## 2019-02-01 LAB — C-REACTIVE PROTEIN: CRP: 1 mg/dL — ABNORMAL HIGH (ref <1.0)

## 2019-02-01 NOTE — Assessment & Plan Note (Addendum)
#  Anemia hemoglobin around 9/likely secondary to CKD/iron deficiency.  Recommend labs CBC CMP iron studies ferritin multiple myeloma panel.  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  #Peripheral vascular disease-awaiting arterial studies  # Discussed the potential acute infusion reactions with IV iron; which are quite rare.  Patient understands the risk; will proceed with infusions.  Thank you Dr. Sabra Heck for allowing me to participate in the care of your pleasant patient. Please do not hesitate to contact me with questions or concerns in the interim.  # DISPOSITION: H- [phone].  # Labs today # follow up TBD- Dr.B

## 2019-02-01 NOTE — Progress Notes (Signed)
[place] NOTE  Patient Care Team: Rusty Aus, MD as PCP - General (Internal Medicine) Josefine Class, MD as Referring Physician (Gastroenterology) Cammie Sickle, MD as Consulting Physician (Hematology and Oncology)  CHIEF COMPLAINTS/PURPOSE OF CONSULTATION: Anemia   HEMATOLOGY HISTORY  # ANEMIA- Jan 2021- 8.8/ferritin 11 [PCP]; N-WBC/platelets? IDA vs other- EGD-2015/colonoscopy-? 2015; 2020- [Dr.Skulskie] ; capsule-2016- ? Small AVMs [[facility]] Bone marrow Biopsy-none; NOV 2020- CT- no liver/spleen   # CKD- stage III [GFR-40s]  HISTORY OF PRESENTING ILLNESS:  Cheryl Leonard 74 y.o.  female has been referred to [place] for further evaluation/work-up for chronic anemia.  Patient complains of worsening fatigue.  Poor tolerance to exertion.  Denies any blood in stools or black or stools.   Blood in stools: None Change in bowel habits- constipation/ diarrhea x 5 years.  Blood in urine: None Difficulty swallowing: None Abnormal weight loss: None Iron supplementation: one year; No IV iron infusions.  Prior Blood transfusions: in 1976- placenta previa.  Vaginal bleeding: None  Review of Systems  Constitutional: Positive for malaise/fatigue. Negative for chills, diaphoresis and fever.  HENT: Negative for nosebleeds and sore throat.   Eyes: Negative for double vision.  Respiratory: Positive for shortness of breath. Negative for cough, hemoptysis, sputum production and wheezing.   Cardiovascular: Negative for chest pain, palpitations, orthopnea and leg swelling.  Gastrointestinal: Negative for abdominal pain, blood in stool, constipation, diarrhea, heartburn, melena, nausea and vomiting.  Genitourinary: Negative for dysuria, frequency and urgency.  Musculoskeletal: Positive for joint pain.  Skin: Negative.  Negative for itching and rash.  Neurological: Negative for dizziness, tingling, focal weakness, weakness and headaches.  Endo/Heme/Allergies:  Does not bruise/bleed easily.  Psychiatric/Behavioral: Negative for depression. The patient is not nervous/anxious and does not have insomnia.     MEDICAL HISTORY:  Past Medical History:  Diagnosis Date  . Anemia   . Anxiety   . Arthritis   . Cataracts, both eyes   . Diabetic retinopathy ([place])    NPDR OU  . GERD (gastroesophageal reflux disease)   . Gout   . Headache    h/o migraines  . History of fracture of patella    right knee  . History of positive PPD    Patient always shows positive  . Hyperlipidemia   . Hypertension   . Hypertensive retinopathy    OU  . Hypothyroidism   . Lichen sclerosus 10/30/2534   of vulva  . Metatarsal fracture   . Neuropathy   . Peripheral vascular disease ([place])   . Polyneuropathy    numbness and tingling in feet and toes  . Renal insufficiency   . Sleep apnea    does not use cpap-lost weight   . Type 2 diabetes mellitus, uncontrolled ([place])     SURGICAL HISTORY: Past Surgical History:  Procedure Laterality Date  . APPENDECTOMY    . BREAST REDUCTION SURGERY  2001  . CATARACT EXTRACTION    . CESAREAN SECTION  1976  . COLONOSCOPY  03/05/2013   Nml - due for repeat 03/06/2018  . DILATION AND CURETTAGE OF UTERUS  1989  . ENDARTERECTOMY FEMORAL Bilateral 03/09/2018   Procedure: ENDARTERECTOMY FEMORAL;  Surgeon: Algernon Huxley, MD;  Location: [facility];  Service: Vascular;  Laterality: Bilateral;  . ENDARTERECTOMY POPLITEAL Left 03/09/2018   Procedure: ENDARTERECTOMY POPLITEAL AND SFA;  Surgeon: Algernon Huxley, MD;  Location: [facility];  Service: Vascular;  Laterality: Left;  . ESOPHAGOGASTRODUODENOSCOPY  03/05/2013  . EYE SURGERY    .  Eyelid Surgery  2012  . INTRAMEDULLARY (IM) NAIL INTERTROCHANTERIC Left 10/30/2015   Procedure: INTRAMEDULLARY (IM) NAIL INTERTROCHANTRIC ;  Surgeon: Hessie Knows, MD;  Location: [facility];  Service: Orthopedics;  Laterality: Left;  . KYPHOPLASTY N/A 10/25/2018   Procedure: L4 KYPHOPLASTY;  Surgeon: Hessie Knows, MD;   Location: [facility];  Service: Orthopedics;  Laterality: N/A;  . LAPAROSCOPIC HYSTERECTOMY  2000   total  . LOWER EXTREMITY ANGIOGRAPHY Left 03/08/2017   Procedure: LOWER EXTREMITY ANGIOGRAPHY;  Surgeon: Algernon Huxley, MD;  Location: [place] [facility];  Service: Cardiovascular;  Laterality: Left;  . LOWER EXTREMITY ANGIOGRAPHY Left 10/30/2017   Procedure: LOWER EXTREMITY ANGIOGRAPHY;  Surgeon: Algernon Huxley, MD;  Location: [place] [facility];  Service: Cardiovascular;  Laterality: Left;  . LOWER EXTREMITY ANGIOGRAPHY Right 03/08/2018   Procedure: LOWER EXTREMITY ANGIOGRAPHY;  Surgeon: Algernon Huxley, MD;  Location: [place] [facility];  Service: Cardiovascular;  Laterality: Right;  . LOWER EXTREMITY ANGIOGRAPHY Left 10/01/2018   Procedure: LOWER EXTREMITY ANGIOGRAPHY;  Surgeon: Algernon Huxley, MD;  Location: [place] [place] [facility];  Service: Cardiovascular;  Laterality: Left;  . LOWER EXTREMITY ANGIOGRAPHY Right 10/08/2018   Procedure: LOWER EXTREMITY ANGIOGRAPHY;  Surgeon: Algernon Huxley, MD;  Location: [place];  Service: Cardiovascular;  Laterality: Right;  . PERIPHERAL VASCULAR INTERVENTION  03/08/2018   Procedure: PERIPHERAL VASCULAR INTERVENTION;  Surgeon: Algernon Huxley, MD;  Location: [place];  Service: Cardiovascular;;  . REDUCTION MAMMAPLASTY  1997  . SACROPLASTY N/A 10/25/2018   Procedure: S1 SACROPLASTY;  Surgeon: Hessie Knows, MD;  Location: [facility];  Service: Orthopedics;  Laterality: N/A;    SOCIAL HISTORY: Social History   Socioeconomic History  . Marital status: Married    Spouse name: Jenny Reichmann  . Number of children: 3  . Years of education: Not on file  . Highest education level: Not on file  Occupational History  . Occupation: Retail banker  Tobacco Use  . Smoking status: Former Smoker    Packs/day: 1.00    Years: 20.00    Pack years: 20.00    Types: Cigarettes    Quit date: 03/07/1996    Years since quitting: 22.9  . Smokeless tobacco: Never Used   . Tobacco comment: started smoking at age 84  Substance and Sexual Activity  . Alcohol use: No    Alcohol/week: 0.0 standard drinks  . Drug use: No  . Sexual activity: Yes    Partners: Male    Birth control/protection: Surgical  Other Topics Concern  . Not on file  Social History Narrative   Lives in [place]; with husband; quit > 20 years; no alcohol; used to work at [employer] at [employer].    Social Determinants of Health   Financial Resource Strain: Low Risk   . Difficulty of Paying Living Expenses: Not very hard  Food Insecurity: No Food Insecurity  . Worried About Charity fundraiser in the Last Year: Never true  . Ran Out of Food in the Last Year: Never true  Transportation Needs: Unknown  . Lack of Transportation (Medical): No  . Lack of Transportation (Non-Medical): Not on file  Physical Activity: Unknown  . Days of Exercise per Week: 2 days  . Minutes of Exercise per Session: Not on file  Stress: Stress Concern Present  . Feeling of Stress : To some extent  Social Connections: Unknown  . Frequency of Communication with Friends and Family: More than three times a week  .  Frequency of Social Gatherings with Friends and Family: Not on file  . Attends Religious Services: Not on file  . Active Member of Clubs or Organizations: Not on file  . Attends Archivist Meetings: Not on file  . Marital Status: Married  Human resources officer Violence: Not At Risk  . Fear of Current or Ex-Partner: No  . Emotionally Abused: No  . Physically Abused: No  . Sexually Abused: No    FAMILY HISTORY: Family History  Problem Relation Age of Onset  . Coronary artery disease Father   . Heart attack Father   . Coronary artery disease Mother   . Heart attack Mother   . Ovarian cancer Sister 11       sister had hormonal therapy for IVF txs-which increased risk factor for ovarian cancer  . Breast cancer Neg Hx     ALLERGIES:  has No Known Allergies.  MEDICATIONS:  Current  Outpatient Medications  Medication Sig Dispense Refill  . acetaminophen (TYLENOL) 500 MG tablet Take 1,000 mg by mouth every 6 (six) hours as needed for moderate pain or headache.    . ALPRAZolam (XANAX) 0.25 MG tablet Take 0.25 mg by mouth daily as needed for anxiety or sleep.     . Ascorbic Acid (VITAMIN C) 1000 MG tablet Take 1,000 mg by mouth daily.    [name] aspirin EC 81 MG tablet Take 81 mg by mouth daily.     [name] buPROPion (WELLBUTRIN XL) 150 MG 24 hr tablet Take 150 mg by mouth daily.     . calcium carbonate (TUMS EX) 750 MG chewable tablet Chew 2,250 mg by mouth daily as needed for heartburn.    . cholecalciferol (VITAMIN D) 1000 units tablet Take 1,000 Units by mouth 2 (two) times daily.    [name] CORAL CALCIUM PO Take 1 tablet by mouth 2 (two) times daily.     [name] denosumab (PROLIA) 60 MG/ML SOLN injection Inject 60 mg into the skin every 6 (six) months.     . DENTA 5000 PLUS 1.1 % CREA dental cream Place 1 application onto teeth 2 (two) times daily. as directed  99  . DULoxetine (CYMBALTA) 60 MG capsule Take 1 capsule (60 mg total) by mouth 2 (two) times daily.  3  . ELIQUIS 5 MG TABS tablet TAKE 1 TABLET BY MOUTH TWICE A DAY 60 tablet 2  . famotidine (PEPCID) 40 MG tablet Take 40 mg by mouth 2 (two) times daily.     . ferrous gluconate (FERGON) 324 MG tablet Take 324 mg by mouth daily with breakfast.    . furosemide (LASIX) 20 MG tablet Take 20 mg by mouth daily as needed for fluid or edema.     . Insulin Pen Needle (B-D UF III MINI PEN NEEDLES) 31G X 5 MM MISC once daily.    [name] levothyroxine (SYNTHROID, LEVOTHROID) 100 MCG tablet Take 100 mcg by mouth daily before breakfast.   3  . lisinopril (PRINIVIL,ZESTRIL) 10 MG tablet Take 10 mg by mouth every morning.     . Magnesium 500 MG TABS Take 500 mg by mouth every morning.     . metFORMIN (GLUCOPHAGE) 1000 MG tablet Take 1,000 mg by mouth 2 (two) times daily.    . Multiple Vitamin (MULTIVITAMIN WITH MINERALS) TABS tablet Take 1 tablet by mouth  daily.    . pantoprazole (PROTONIX) 40 MG tablet Take 40 mg by mouth every morning.     . rosuvastatin (CRESTOR) 20 MG tablet Take 20  mg by mouth every morning.     . TRESIBA FLEXTOUCH 200 UNIT/ML SOPN Inject 30 Units as directed at bedtime.   5  . VASCEPA 1 g CAPS Take 1 capsule by mouth 2 (two) times daily.    . vitamin B-12 (CYANOCOBALAMIN) 1000 MCG tablet Take 1,000 mcg by mouth daily.    . vitamin E 400 UNIT capsule Take 400 Units by mouth daily.    [name] zolpidem (AMBIEN) 5 MG tablet Take 1 tablet (5 mg total) by mouth at bedtime. (Patient taking differently: Take 5 mg by mouth at bedtime as needed. ) 30 tablet 0  . Aflibercept (EYLEA) 2 MG/0.05ML SOLN 2 mg by Intravitreal route every 5 (five) weeks.     [name] BYSTOLIC 5 MG tablet Take 5 mg by mouth every morning.   2  . chlorhexidine (PERIDEX) 0.12 % solution Use as directed 15 mLs in the mouth or throat 2 (two) times daily.   0  . metoprolol succinate (TOPROL-XL) 50 MG 24 hr tablet Take by mouth.    . traMADol (ULTRAM) 50 MG tablet Take 1 tablet (50 mg total) by mouth every 6 (six) hours as needed. 20 tablet 0  . TYMLOS 3120 MCG/1.56ML SOPN      Current Facility-Administered Medications  Medication Dose Route Frequency Provider Last Rate Last Admin  . aflibercept (EYLEA) SOLN 2 mg  2 mg Intravitreal  Bernarda Caffey, MD   2 mg at 06/27/17 1307  . aflibercept (EYLEA) SOLN 2 mg  2 mg Intravitreal  Bernarda Caffey, MD   2 mg at 07/26/17 1702  . aflibercept (EYLEA) SOLN 2 mg  2 mg Intravitreal  Bernarda Caffey, MD   2 mg at 09/06/17 1700  . aflibercept (EYLEA) SOLN 2 mg  2 mg Intravitreal  Bernarda Caffey, MD   2 mg at 11/01/17 1550  . aflibercept (EYLEA) SOLN 2 mg  2 mg Intravitreal  Bernarda Caffey, MD   2 mg at 01/03/18 1416  . Bevacizumab (AVASTIN) SOLN 1.25 mg  1.25 mg Intravitreal  Bernarda Caffey, MD   1.25 mg at 02/10/17 0953  . Bevacizumab (AVASTIN) SOLN 1.25 mg  1.25 mg Intravitreal  Bernarda Caffey, MD   1.25 mg at 03/13/17 1717  . Bevacizumab  (AVASTIN) SOLN 1.25 mg  1.25 mg Intravitreal  Bernarda Caffey, MD   1.25 mg at 04/11/17 1626  . Bevacizumab (AVASTIN) SOLN 1.25 mg  1.25 mg Intravitreal  Bernarda Caffey, MD   1.25 mg at 02/12/18 2154  . insulin aspart (novoLOG) injection 0-15 Units  0-15 Units Subcutaneous TID WC [name], [name], PA-C      . insulin aspart (novoLOG) injection 0-5 Units  0-5 Units Subcutaneous QHS [name], [name], PA-C          PHYSICAL EXAMINATION:   Vitals:   02/01/19 1059  BP: (!) 169/76  Pulse: (!) 106  Temp: 98.1 F (36.7 C)   Filed Weights   02/01/19 1059  Weight: 164 lb (74.4 kg)    Physical Exam  Constitutional: She is oriented to person, place, and time and well-developed, well-nourished, and in no distress.  HENT:  Head: Normocephalic and atraumatic.  Mouth/Throat: Oropharynx is clear and moist. No oropharyngeal exudate.  Eyes: Pupils are equal, round, and reactive to light.  Cardiovascular: Normal rate and regular rhythm.  Pulmonary/Chest: Effort normal and breath sounds normal. No respiratory distress. She has no wheezes.  Abdominal: Soft. Bowel sounds are normal. She exhibits no distension and no mass. There is no  abdominal tenderness. There is no rebound and no guarding.  Musculoskeletal:        General: No tenderness or edema. Normal range of motion.     Cervical back: Normal range of motion and neck supple.  Neurological: She is alert and oriented to person, place, and time.  Skin: Skin is warm.  Psychiatric: Affect normal.    LABORATORY DATA:  I have reviewed the data as listed Lab Results  Component Value Date   WBC 7.7 02/01/2019   HGB 9.0 (L) 02/01/2019   HCT 31.1 (L) 02/01/2019   MCV 84.1 02/01/2019   PLT 365 02/01/2019   Recent Labs    03/11/18 0747 03/11/18 0747 03/12/18 0442 03/12/18 0442 10/01/18 1033 10/08/18 0811 02/01/19 1156  NA 132*  --  138  --   --   --  136  K 4.2  --  4.0  --   --   --  4.5  CL 103  --  108  --   --   --  104  CO2  19*  --  23  --   --   --  22  GLUCOSE 140*  --  80  --   --   --  323*  BUN 28*   < > 21   < > 21 24* 23  CREATININE 1.50*   < > 1.09*   < > 1.35* 1.68* 1.26*  CALCIUM 9.0  --  8.3*  --   --   --  9.2  GFRNONAA 34*   < > 51*   < > 39* 30* 42*  GFRAA 40*   < > 59*   < > 45* 35* 49*  PROT  --   --   --   --   --   --  7.2  ALBUMIN  --   --   --   --   --   --  3.9  AST  --   --   --   --   --   --  24  ALT  --   --   --   --   --   --  17  ALKPHOS  --   --   --   --   --   --  65  BILITOT  --   --   --   --   --   --  0.6   < > = values in this interval not displayed.     VAS [place] ABI WITH/WO TBI  Result Date: 02/05/2019 LOWER EXTREMITY DOPPLER STUDY Indications: Peripheral artery disease.  Vascular Interventions: On 03/08/2017 Aortogram - PTA of the left SFA with                         angioplasty/balloon. Stent placement to the left SFA.                         PTA of the left CIA with angioplasty/balloon.                         10/30/2017 PTA of left SFA and proximal popliteal                         artery. Left SFA and proximal popliteal artery stent.  10/01/2018:Aortogram and Selective Left Lower Extremity                         Angiogram. Thrombolytic therapy TPA instilled in the                         Left SFA and Popliteal Artery. Mechanical Thrombectomy                         to the Left SFA and Popliteal Artery. PTA of the Left                         External Iliac Artery and CFA. PTA of the Left SFA and                         Proximal Popliteal Artery. Stent placement to the                         Popliteal Artery and Distal SFA. Stent placed to the                         Left SFA.                          10/08/2018: Aortogram and Selctive Right Lower Extremity                         Angiogram. Mechanical Thrombectomy of the Right SFA,                         Popliteal Artery and Posterior tibial Artery. PTA of the                         Right SFA and  Proximal Popliteal Artery. Stent placement                         to the Distal SFAand Proximal Popliteal Artery. Stent                         placement to the Right SFA. PTA of the Right Posterior                         Tibial Artery. placement. Comparison Study: 11/26/2018 Performing Technologist: Almira Coaster RVS  Examination Guidelines: A complete evaluation includes at minimum, Doppler waveform signals and systolic blood pressure reading at the level of bilateral brachial, anterior tibial, and posterior tibial arteries, when vessel segments are accessible. Bilateral testing is considered an integral part of a complete examination. Photoelectric Plethysmograph (PPG) waveforms and toe systolic pressure readings are included as required and additional duplex testing as needed. Limited examinations for reoccurring indications may be performed as noted.  ABI Findings: +---------+------------------+-----+--------+--------+ Right    Rt Pressure (mmHg)IndexWaveformComment  +---------+------------------+-----+--------+--------+ Brachial 165                                     +---------+------------------+-----+--------+--------+ ATA      198  1.14 biphasic         +---------+------------------+-----+--------+--------+ PTA      193               1.12 biphasic         +---------+------------------+-----+--------+--------+ Great Toe171               0.99 Normal           +---------+------------------+-----+--------+--------+ +---------+------------------+-----+--------+-------+ Left     Lt Pressure (mmHg)IndexWaveformComment +---------+------------------+-----+--------+-------+ Brachial 173                                    +---------+------------------+-----+--------+-------+ ATA      186               1.08 biphasic        +---------+------------------+-----+--------+-------+ PTA      190               1.10 biphasic         +---------+------------------+-----+--------+-------+ Alcario Drought               1.12 Normal          +---------+------------------+-----+--------+-------+ +-------+-----------+-----------+------------+------------+ ABI/TBIToday's ABIToday's TBIPrevious ABIPrevious TBI +-------+-----------+-----------+------------+------------+ Right  1.14       .99        1.08                     +-------+-----------+-----------+------------+------------+ Left   1.10       1.12       1.01                     +-------+-----------+-----------+------------+------------+  Bilateral ABIs appear essentially unchanged compared to prior study on 11/26/2018.  Summary: Right: Resting right ankle-brachial index is within normal range. No evidence of significant right lower extremity arterial disease. The right toe-brachial index is normal. Left: Resting left ankle-brachial index is within normal range. No evidence of significant left lower extremity arterial disease. The left toe-brachial index is normal.  *See table(s) above for measurements and observations.  Electronically signed by Leotis Pain MD on 02/05/2019 at 3:50:33 PM.    Final    VAS [place] LOWER EXTREMITY ARTERIAL DUPLEX  Result Date: 02/05/2019 LOWER EXTREMITY ARTERIAL DUPLEX STUDY Indications: Peripheral artery disease, and S/P Angioplasty with stent.  Vascular Interventions: On 03/08/2017 Aortogram - PTA of the left SFA with                         angioplasty/balloon. Stent placement to the left SFA.                         PTA of the left CIA with angioplasty/balloon.                         10/30/2017 PTA of left SFA and proximal popliteal                         artery. Left SFA and proximal popliteal artery stent.                          10/01/2018:Aortogram and Selective Left Lower Extremity  Angiogram. Thrombolytic therapy TPA instilled in the                         Left SFA and Popliteal Artery. Mechanical Thrombectomy                          to the Left SFA and Popliteal Artery. PTA of the Left                         External Iliac Artery and CFA. PTA of the Left SFA and                         Proximal Popliteal Artery. Stent placement to the                         Popliteal Artery and Distal SFA. Stent placed to the                         Left SFA.                          10/08/2018: Aortogram and Selctive Right Lower Extremity                         Angiogram. Mechanical Thrombectomy of the Right SFA,                         Popliteal Artery and Posterior tibial Artery. PTA of the                         Right SFA and Proximal Popliteal Artery. Stent placement                         to the Distal SFAand Proximal Popliteal Artery. Stent                         placement to the Right SFA. PTA of the Right Posterior                         Tibial Artery. placement. Current ABI:            Rt1.14,Lt 1.10 Comparison Study: 09/25/2018 Performing Technologist: Almira Coaster RVS  Examination Guidelines: A complete evaluation includes B-mode imaging, spectral Doppler, color Doppler, and power Doppler as needed of all accessible portions of each vessel. Bilateral testing is considered an integral part of a complete examination. Limited examinations for reoccurring indications may be performed as noted.  +-----------+--------+-----+--------+---------+--------+ RIGHT      PSV cm/sRatioStenosisWaveform Comments +-----------+--------+-----+--------+---------+--------+ CFA Distal 99                   biphasic          +-----------+--------+-----+--------+---------+--------+ DFA        74                   biphasic          +-----------+--------+-----+--------+---------+--------+ SFA Prox   80                   biphasic Stent    +-----------+--------+-----+--------+---------+--------+  SFA Mid    90                   biphasic          +-----------+--------+-----+--------+---------+--------+ SFA Distal 97                    biphasic          +-----------+--------+-----+--------+---------+--------+ POP Distal 127                  triphasic         +-----------+--------+-----+--------+---------+--------+ ATA Distal 77                   biphasic          +-----------+--------+-----+--------+---------+--------+ PTA Distal 96                   biphasic          +-----------+--------+-----+--------+---------+--------+ PERO Distal84                   biphasic          +-----------+--------+-----+--------+---------+--------+  +-----------+--------+-----+--------+--------+--------+ LEFT       PSV cm/sRatioStenosisWaveformComments +-----------+--------+-----+--------+--------+--------+ CFA Distal 92                   biphasic         +-----------+--------+-----+--------+--------+--------+ DFA        42                   biphasic         +-----------+--------+-----+--------+--------+--------+ SFA Prox   75                   biphasic         +-----------+--------+-----+--------+--------+--------+ SFA Mid    70                   biphasic         +-----------+--------+-----+--------+--------+--------+ SFA Distal 62                   biphasic         +-----------+--------+-----+--------+--------+--------+ POP Distal 124                  biphasic         +-----------+--------+-----+--------+--------+--------+ ATA Distal 61                   biphasic         +-----------+--------+-----+--------+--------+--------+ PTA Distal 112                  biphasic         +-----------+--------+-----+--------+--------+--------+ PERO Distal42                   biphasic         +-----------+--------+-----+--------+--------+--------+  Summary: Right: Imaging and Waveforms obtained throughout in the Right Lower Extremity. Biphasic Waveforms obtained throughout. Left: Imaging and Waveforms obtained throughout in the Left Lower Extremity. Biphasic  Waveforms obtained throughout.  See table(s) above for measurements and observations. Electronically signed by Leotis Pain MD on 02/05/2019 at 3:50:36 PM.    Final     Normocytic anemia #Anemia hemoglobin around 9/likely secondary to CKD/iron deficiency.  Recommend labs CBC CMP iron studies ferritin multiple myeloma panel.  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  #Peripheral vascular disease-awaiting arterial studies  # Discussed the potential acute infusion reactions with IV iron; which are quite  rare.  Patient understands the risk; will proceed with infusions.  Thank you Dr. Sabra Heck for allowing me to participate in the care of your pleasant patient. Please do not hesitate to contact me with questions or concerns in the interim.  # DISPOSITION: H- [phone].  # Labs today # follow up TBD- Dr.B   All questions were answered. The patient knows to call the clinic with any problems, questions or concerns.    Cammie Sickle, MD 02/05/2019 5:17 PM

## 2019-02-02 LAB — HAPTOGLOBIN: Haptoglobin: 192 mg/dL (ref 42–346)

## 2019-02-04 LAB — KAPPA/LAMBDA LIGHT CHAINS
Kappa free light chain: 45 mg/L — ABNORMAL HIGH (ref 3.3–19.4)
Kappa, lambda light chain ratio: 1.32 (ref 0.26–1.65)
Lambda free light chains: 34.1 mg/L — ABNORMAL HIGH (ref 5.7–26.3)

## 2019-02-05 ENCOUNTER — Other Ambulatory Visit (INDEPENDENT_AMBULATORY_CARE_PROVIDER_SITE_OTHER): Payer: Self-pay | Admitting: Vascular Surgery

## 2019-02-05 ENCOUNTER — Encounter (INDEPENDENT_AMBULATORY_CARE_PROVIDER_SITE_OTHER): Payer: Self-pay | Admitting: Vascular Surgery

## 2019-02-05 ENCOUNTER — Ambulatory Visit (INDEPENDENT_AMBULATORY_CARE_PROVIDER_SITE_OTHER): Payer: Medicare Other | Admitting: Vascular Surgery

## 2019-02-05 ENCOUNTER — Ambulatory Visit (INDEPENDENT_AMBULATORY_CARE_PROVIDER_SITE_OTHER): Payer: Medicare Other

## 2019-02-05 ENCOUNTER — Telehealth: Payer: Self-pay | Admitting: Internal Medicine

## 2019-02-05 ENCOUNTER — Other Ambulatory Visit: Payer: Self-pay

## 2019-02-05 VITALS — BP 156/67 | HR 98 | Resp 16 | Wt 164.0 lb

## 2019-02-05 DIAGNOSIS — Z95820 Peripheral vascular angioplasty status with implants and grafts: Secondary | ICD-10-CM

## 2019-02-05 DIAGNOSIS — I70223 Atherosclerosis of native arteries of extremities with rest pain, bilateral legs: Secondary | ICD-10-CM

## 2019-02-05 DIAGNOSIS — E1142 Type 2 diabetes mellitus with diabetic polyneuropathy: Secondary | ICD-10-CM | POA: Diagnosis not present

## 2019-02-05 DIAGNOSIS — I10 Essential (primary) hypertension: Secondary | ICD-10-CM

## 2019-02-05 DIAGNOSIS — E782 Mixed hyperlipidemia: Secondary | ICD-10-CM

## 2019-02-05 DIAGNOSIS — I7025 Atherosclerosis of native arteries of other extremities with ulceration: Secondary | ICD-10-CM | POA: Diagnosis not present

## 2019-02-05 DIAGNOSIS — D631 Anemia in chronic kidney disease: Secondary | ICD-10-CM | POA: Insufficient documentation

## 2019-02-05 LAB — MULTIPLE MYELOMA PANEL, SERUM
Albumin SerPl Elph-Mcnc: 3.6 g/dL (ref 2.9–4.4)
Albumin/Glob SerPl: 1.3 (ref 0.7–1.7)
Alpha 1: 0.2 g/dL (ref 0.0–0.4)
Alpha2 Glob SerPl Elph-Mcnc: 0.9 g/dL (ref 0.4–1.0)
B-Globulin SerPl Elph-Mcnc: 1.1 g/dL (ref 0.7–1.3)
Gamma Glob SerPl Elph-Mcnc: 0.8 g/dL (ref 0.4–1.8)
Globulin, Total: 3 g/dL (ref 2.2–3.9)
IgA: 259 mg/dL (ref 64–422)
IgG (Immunoglobin G), Serum: 747 mg/dL (ref 586–1602)
IgM (Immunoglobulin M), Srm: 95 mg/dL (ref 26–217)
Total Protein ELP: 6.6 g/dL (ref 6.0–8.5)

## 2019-02-05 NOTE — Progress Notes (Signed)
MRN : [id number]  Cheryl Leonard is a 74 y.o. (1945-05-03) female who presents with chief complaint of  Chief Complaint  Patient presents with  . Follow-up    ultrasound follow up  .  History of Present Illness: Patient returns today in follow up of her PAD.  She is doing well.  She is on anticoagulation tolerating this well.  She has undergone multiple extensive revascularizations in the past.  Her noninvasive studies today showed normal waveforms and normal ABIs and digital pressures bilaterally.  Duplex shows no hemodynamically significant stenosis in either lower extremity of concern.  Current Outpatient Medications  Medication Sig Dispense Refill  . acetaminophen (TYLENOL) 500 MG tablet Take 1,000 mg by mouth every 6 (six) hours as needed for moderate pain or headache.    . Aflibercept (EYLEA) 2 MG/0.05ML SOLN 2 mg by Intravitreal route every 5 (five) weeks.     . ALPRAZolam (XANAX) 0.25 MG tablet Take 0.25 mg by mouth daily as needed for anxiety or sleep.     . Ascorbic Acid (VITAMIN C) 1000 MG tablet Take 1,000 mg by mouth daily.    [name] aspirin EC 81 MG tablet Take 81 mg by mouth daily.     [name] buPROPion (WELLBUTRIN XL) 150 MG 24 hr tablet Take 150 mg by mouth daily.     [name] BYSTOLIC 5 MG tablet Take 5 mg by mouth every morning.   2  . calcium carbonate (TUMS EX) 750 MG chewable tablet Chew 2,250 mg by mouth daily as needed for heartburn.    . chlorhexidine (PERIDEX) 0.12 % solution Use as directed 15 mLs in the mouth or throat 2 (two) times daily.   0  . cholecalciferol (VITAMIN D) 1000 units tablet Take 1,000 Units by mouth 2 (two) times daily.    [name] CORAL CALCIUM PO Take 1 tablet by mouth 2 (two) times daily.     [name] denosumab (PROLIA) 60 MG/ML SOLN injection Inject 60 mg into the skin every 6 (six) months.     . DENTA 5000 PLUS 1.1 % CREA dental cream Place 1 application onto teeth 2 (two) times daily. as directed  99  . DULoxetine (CYMBALTA) 60 MG capsule Take 1 capsule (60 mg  total) by mouth 2 (two) times daily.  3  . ELIQUIS 5 MG TABS tablet TAKE 1 TABLET BY MOUTH TWICE A DAY 60 tablet 2  . famotidine (PEPCID) 40 MG tablet Take 40 mg by mouth 2 (two) times daily.     . ferrous gluconate (FERGON) 324 MG tablet Take 324 mg by mouth daily with breakfast.    . furosemide (LASIX) 20 MG tablet Take 20 mg by mouth daily as needed for fluid or edema.     . Insulin Pen Needle (B-D UF III MINI PEN NEEDLES) 31G X 5 MM MISC once daily.    [name] levothyroxine (SYNTHROID, LEVOTHROID) 100 MCG tablet Take 100 mcg by mouth daily before breakfast.   3  . lisinopril (PRINIVIL,ZESTRIL) 10 MG tablet Take 10 mg by mouth every morning.     . Magnesium 500 MG TABS Take 500 mg by mouth every morning.     . metFORMIN (GLUCOPHAGE) 1000 MG tablet Take 1,000 mg by mouth 2 (two) times daily.    . metoprolol succinate (TOPROL-XL) 50 MG 24 hr tablet Take by mouth.    . Multiple Vitamin (MULTIVITAMIN WITH MINERALS) TABS tablet Take 1 tablet by mouth daily.    . pantoprazole (PROTONIX) 40 MG  tablet Take 40 mg by mouth every morning.     . rosuvastatin (CRESTOR) 20 MG tablet Take 20 mg by mouth every morning.     . traMADol (ULTRAM) 50 MG tablet Take 1 tablet (50 mg total) by mouth every 6 (six) hours as needed. 20 tablet 0  . TRESIBA FLEXTOUCH 200 UNIT/ML SOPN Inject 30 Units as directed at bedtime.   5  . TYMLOS 3120 MCG/1.56ML SOPN     . VASCEPA 1 g CAPS Take 1 capsule by mouth 2 (two) times daily.    . vitamin B-12 (CYANOCOBALAMIN) 1000 MCG tablet Take 1,000 mcg by mouth daily.    . vitamin E 400 UNIT capsule Take 400 Units by mouth daily.    [name] zolpidem (AMBIEN) 5 MG tablet Take 1 tablet (5 mg total) by mouth at bedtime. (Patient taking differently: Take 5 mg by mouth at bedtime as needed. ) 30 tablet 0   Current Facility-Administered Medications  Medication Dose Route Frequency Provider Last Rate Last Admin  . aflibercept (EYLEA) SOLN 2 mg  2 mg Intravitreal  Bernarda Caffey, MD   2 mg at 06/27/17  1307  . aflibercept (EYLEA) SOLN 2 mg  2 mg Intravitreal  Bernarda Caffey, MD   2 mg at 07/26/17 1702  . aflibercept (EYLEA) SOLN 2 mg  2 mg Intravitreal  Bernarda Caffey, MD   2 mg at 09/06/17 1700  . aflibercept (EYLEA) SOLN 2 mg  2 mg Intravitreal  Bernarda Caffey, MD   2 mg at 11/01/17 1550  . aflibercept (EYLEA) SOLN 2 mg  2 mg Intravitreal  Bernarda Caffey, MD   2 mg at 01/03/18 1416  . Bevacizumab (AVASTIN) SOLN 1.25 mg  1.25 mg Intravitreal  Bernarda Caffey, MD   1.25 mg at 02/10/17 0953  . Bevacizumab (AVASTIN) SOLN 1.25 mg  1.25 mg Intravitreal  Bernarda Caffey, MD   1.25 mg at 03/13/17 1717  . Bevacizumab (AVASTIN) SOLN 1.25 mg  1.25 mg Intravitreal  Bernarda Caffey, MD   1.25 mg at 04/11/17 1626  . Bevacizumab (AVASTIN) SOLN 1.25 mg  1.25 mg Intravitreal  Bernarda Caffey, MD   1.25 mg at 02/12/18 2154  . insulin aspart (novoLOG) injection 0-15 Units  0-15 Units Subcutaneous TID WC [name], [name], PA-C      . insulin aspart (novoLOG) injection 0-5 Units  0-5 Units Subcutaneous QHS [name], Janalyn Harder, PA-C        Past Medical History:  Diagnosis Date  . Anemia   . Anxiety   . Arthritis   . Cataracts, both eyes   . Diabetic retinopathy ([place])    NPDR OU  . GERD (gastroesophageal reflux disease)   . Gout   . Headache    h/o migraines  . History of fracture of patella    right knee  . History of positive PPD    Patient always shows positive  . Hyperlipidemia   . Hypertension   . Hypertensive retinopathy    OU  . Hypothyroidism   . Lichen sclerosus 16/10/9602   of vulva  . Metatarsal fracture   . Neuropathy   . Peripheral vascular disease ([place])   . Polyneuropathy    numbness and tingling in feet and toes  . Renal insufficiency   . Sleep apnea    does not use cpap-lost weight   . Type 2 diabetes mellitus, uncontrolled ([place])     Past Surgical History:  Procedure Laterality Date  . APPENDECTOMY    . BREAST  REDUCTION SURGERY  2001  . CATARACT EXTRACTION    . CESAREAN  SECTION  1976  . COLONOSCOPY  03/05/2013   Nml - due for repeat 03/06/2018  . DILATION AND CURETTAGE OF UTERUS  1989  . ENDARTERECTOMY FEMORAL Bilateral 03/09/2018   Procedure: ENDARTERECTOMY FEMORAL;  Surgeon: Algernon Huxley, MD;  Location: [facility];  Service: Vascular;  Laterality: Bilateral;  . ENDARTERECTOMY POPLITEAL Left 03/09/2018   Procedure: ENDARTERECTOMY POPLITEAL AND SFA;  Surgeon: Algernon Huxley, MD;  Location: [facility];  Service: Vascular;  Laterality: Left;  . ESOPHAGOGASTRODUODENOSCOPY  03/05/2013  . EYE SURGERY    . Eyelid Surgery  2012  . INTRAMEDULLARY (IM) NAIL INTERTROCHANTERIC Left 10/30/2015   Procedure: INTRAMEDULLARY (IM) NAIL INTERTROCHANTRIC ;  Surgeon: Hessie Knows, MD;  Location: [facility];  Service: Orthopedics;  Laterality: Left;  . KYPHOPLASTY N/A 10/25/2018   Procedure: L4 KYPHOPLASTY;  Surgeon: Hessie Knows, MD;  Location: [facility];  Service: Orthopedics;  Laterality: N/A;  . LAPAROSCOPIC HYSTERECTOMY  2000   total  . LOWER EXTREMITY ANGIOGRAPHY Left 03/08/2017   Procedure: LOWER EXTREMITY ANGIOGRAPHY;  Surgeon: Algernon Huxley, MD;  Location: [place] [facility];  Service: Cardiovascular;  Laterality: Left;  . LOWER EXTREMITY ANGIOGRAPHY Left 10/30/2017   Procedure: LOWER EXTREMITY ANGIOGRAPHY;  Surgeon: Algernon Huxley, MD;  Location: [place] [facility];  Service: Cardiovascular;  Laterality: Left;  . LOWER EXTREMITY ANGIOGRAPHY Right 03/08/2018   Procedure: LOWER EXTREMITY ANGIOGRAPHY;  Surgeon: Algernon Huxley, MD;  Location: [place] [facility];  Service: Cardiovascular;  Laterality: Right;  . LOWER EXTREMITY ANGIOGRAPHY Left 10/01/2018   Procedure: LOWER EXTREMITY ANGIOGRAPHY;  Surgeon: Algernon Huxley, MD;  Location: [place] [facility];  Service: Cardiovascular;  Laterality: Left;  . LOWER EXTREMITY ANGIOGRAPHY Right 10/08/2018   Procedure: LOWER EXTREMITY ANGIOGRAPHY;  Surgeon: Algernon Huxley, MD;  Location: [place] [facility];  Service: Cardiovascular;  Laterality: Right;   . PERIPHERAL VASCULAR INTERVENTION  03/08/2018   Procedure: PERIPHERAL VASCULAR INTERVENTION;  Surgeon: Algernon Huxley, MD;  Location: [place] [facility];  Service: Cardiovascular;;  . REDUCTION MAMMAPLASTY  1997  . SACROPLASTY N/A 10/25/2018   Procedure: S1 SACROPLASTY;  Surgeon: Hessie Knows, MD;  Location: [facility];  Service: Orthopedics;  Laterality: N/A;     Social History   Tobacco Use  . Smoking status: Former Smoker    Packs/day: 1.00    Years: 20.00    Pack years: 20.00    Types: Cigarettes    Quit date: 03/07/1996    Years since quitting: 22.9  . Smokeless tobacco: Never Used  . Tobacco comment: started smoking at age 89  Substance Use Topics  . Alcohol use: No    Alcohol/week: 0.0 standard drinks  . Drug use: No    Family History  Problem Relation Age of Onset  . Coronary artery disease Father   . Heart attack Father   . Coronary artery disease Mother   . Heart attack Mother   . Ovarian cancer Sister 89       sister had hormonal therapy for IVF txs-which increased risk factor for ovarian cancer  . Breast cancer Neg Hx      No Known Allergies   REVIEW OF SYSTEMS (Negative unless checked)  Constitutional: [] Weight loss  [] Fever  [] Chills Cardiac: [] Chest pain   [] Chest pressure   [] Palpitations   [] Shortness of breath when laying flat   [] Shortness of breath at rest   [] Shortness of breath  with exertion. Vascular:  [] Pain in legs with walking   [] Pain in legs at rest   [] Pain in legs when laying flat   [x] Claudication   [] Pain in feet when walking  [] Pain in feet at rest  [] Pain in feet when laying flat   [] History of DVT   [] Phlebitis   [] Swelling in legs   [] Varicose veins   [] Non-healing ulcers Pulmonary:   [] Uses home oxygen   [] Productive cough   [] Hemoptysis   [] Wheeze  [] COPD   [] Asthma Neurologic:  [] Dizziness  [] Blackouts   [] Seizures   [] History of stroke   [] History of TIA  [] Aphasia   [] Temporary blindness   [] Dysphagia   [] Weakness or numbness in  arms   [] Weakness or numbness in legs Musculoskeletal:  [x] Arthritis   [] Joint swelling   [] Joint pain   [x] Low back pain Hematologic:  [] Easy bruising  [] Easy bleeding   [] Hypercoagulable state   [x] Anemic   Gastrointestinal:  [] Blood in stool   [] Vomiting blood  [x] Gastroesophageal reflux/heartburn   [] Abdominal pain Genitourinary:  [] Chronic kidney disease   [] Difficult urination  [] Frequent urination  [] Burning with urination   [] Hematuria Skin:  [] Rashes   [x] Ulcers   [x] Wounds Psychological:  [] History of anxiety   []  History of major depression.  Physical Examination  BP (!) 156/67 (BP Location: Right Arm)   Pulse 98   Resp 16   Wt 164 lb (74.4 kg)   BMI 27.29 kg/m  Gen:  WD/WN, NAD Head: [place]/AT, No temporalis wasting. Ear/Nose/Throat: Hearing grossly intact, nares w/o erythema or drainage Eyes: Conjunctiva clear. Sclera non-icteric Neck: Supple.  Trachea midline Pulmonary:  Good air movement, no use of accessory muscles.  Cardiac: Slightly irregular and tachycardic Vascular:  Vessel Right Left  Radial Palpable Palpable                          PT Palpable Palpable  DP Palpable Palpable   Gastrointestinal: soft, non-tender/non-distended. No guarding/reflex.  Musculoskeletal: M/S 5/5 throughout.  No deformity or atrophy.  No significant lower extremity edema. Neurologic: Sensation grossly intact in extremities.  Symmetrical.  Speech is fluent.  Psychiatric: Judgment intact, Mood & affect appropriate for pt's clinical situation. Dermatologic: No rashes or ulcers noted.  No cellulitis or open wounds.       Labs Recent Results (from the past 2160 hour(s))  Kappa/lambda light chains     Status: Abnormal   Collection Time: 02/01/19 11:56 AM  Result Value Ref Range   Kappa free light chain 45.0 (H) 3.3 - 19.4 mg/L   Lamda free light chains 34.1 (H) 5.7 - 26.3 mg/L   Kappa, lamda light chain ratio 1.32 0.26 - 1.65    Comment: (NOTE) Performed At: [facility] [place], [place] [id number] Rush Farmer MD [id number]   Multiple Myeloma Panel (SPEP&IFE w/QIG)     Status: None   Collection Time: 02/01/19 11:56 AM  Result Value Ref Range   IgG (Immunoglobin G), Serum 747 586 - 1,602 mg/dL   IgA 259 64 - 422 mg/dL   IgM (Immunoglobulin M), Srm 95 26 - 217 mg/dL   Total Protein ELP 6.6 6.0 - 8.5 g/dL   Albumin SerPl Elph-Mcnc 3.6 2.9 - 4.4 g/dL   Alpha 1 0.2 0.0 - 0.4 g/dL   Alpha2 Glob SerPl Elph-Mcnc 0.9 0.4 - 1.0 g/dL   B-Globulin SerPl Elph-Mcnc 1.1 0.7 - 1.3 g/dL   Gamma Glob SerPl Elph-Mcnc 0.8 0.4 -  1.8 g/dL   M Protein SerPl Elph-Mcnc Not Observed Not Observed g/dL   Globulin, Total 3.0 2.2 - 3.9 g/dL   Albumin/Glob SerPl 1.3 0.7 - 1.7   IFE 1 Comment     Comment: (NOTE) The immunofixation pattern appears unremarkable. Evidence of monoclonal protein is not apparent.    Please Note Comment     Comment: (NOTE) Protein electrophoresis scan will follow via computer, mail, or courier delivery. Performed At: [facility] [place], [place] [id number] Rush Farmer MD OF:[id number]   Haptoglobin     Status: None   Collection Time: 02/01/19 11:56 AM  Result Value Ref Range   Haptoglobin 192 42 - 346 mg/dL    Comment: (NOTE) Performed At: [facility] [place], [place] [id number] Rush Farmer MD [id number]   Reticulocytes     Status: Abnormal   Collection Time: 02/01/19 11:56 AM  Result Value Ref Range   Retic Ct Pct 2.7 0.4 - 3.1 %   RBC. 3.72 (L) 3.87 - 5.11 MIL/uL   Retic Count, Absolute 99.3 19.0 - 186.0 K/uL   Immature Retic Fract 26.3 (H) 2.3 - 15.9 %    Comment: Performed at [facility], [address], [place], [place] [address]  Ferritin     Status: None   Collection Time: 02/01/19 11:56 AM  Result Value Ref Range   Ferritin 11 11 - 307 ng/mL    Comment: Performed at [facility], [address] [place], [place] [address]   Iron and TIBC     Status: Abnormal   Collection Time: 02/01/19 11:56 AM  Result Value Ref Range   Iron 32 28 - 170 ug/dL   TIBC 414 250 - 450 ug/dL   Saturation Ratios 8 (L) 10.4 - 31.8 %   UIBC 382 ug/dL    Comment: Performed at [facility], [address] [place], [place] [address]  C-reactive protein     Status: Abnormal   Collection Time: 02/01/19 11:56 AM  Result Value Ref Range   CRP 1.0 (H) <1.0 mg/dL    Comment: Performed at [facility], [place] [address], [place], [place] [address]  Lactate dehydrogenase     Status: None   Collection Time: 02/01/19 11:56 AM  Result Value Ref Range   LDH 144 98 - 192 U/L    Comment: Performed at [facility], [address] [place], [place] [address]  Comprehensive metabolic panel     Status: Abnormal   Collection Time: 02/01/19 11:56 AM  Result Value Ref Range   Sodium 136 135 - 145 mmol/L   Potassium 4.5 3.5 - 5.1 mmol/L   Chloride 104 98 - 111 mmol/L   CO2 22 22 - 32 mmol/L   Glucose, Bld 323 (H) 70 - 99 mg/dL   BUN 23 8 - 23 mg/dL   Creatinine, Ser 1.26 (H) 0.44 - 1.00 mg/dL   Calcium 9.2 8.9 - 10.3 mg/dL   Total Protein 7.2 6.5 - 8.1 g/dL   Albumin 3.9 3.5 - 5.0 g/dL   AST 24 15 - 41 U/L   ALT 17 0 - 44 U/L   Alkaline Phosphatase 65 38 - 126 U/L   Total Bilirubin 0.6 0.3 - 1.2 mg/dL   GFR calc non Af Amer 42 (L) >60 mL/min   GFR calc Af Amer 49 (L) >60 mL/min   Anion gap 10 5 - 15    Comment: Performed at [facility],  [place], [place] [address]  CBC with Differential/Platelet     Status: Abnormal   Collection Time: 02/01/19 11:56 AM  Result Value Ref Range   WBC 7.7 4.0 - 10.5 K/uL   RBC 3.70 (L) 3.87 - 5.11 MIL/uL   Hemoglobin 9.0 (L) 12.0 - 15.0 g/dL   HCT 31.1 (L) 36.0 - 46.0 %   MCV 84.1 80.0 - 100.0 fL   MCH 24.3 (L) 26.0 - 34.0 pg   MCHC 28.9 (L) 30.0 - 36.0 g/dL   RDW 18.5 (H) 11.5 - 15.5 %   Platelets 365 150 - 400 K/uL   nRBC 0.0 0.0 - 0.2 %   Neutrophils  Relative % 70 %   Neutro Abs 5.5 1.7 - 7.7 K/uL   Lymphocytes Relative 18 %   Lymphs Abs 1.4 0.7 - 4.0 K/uL   Monocytes Relative 7 %   Monocytes Absolute 0.5 0.1 - 1.0 K/uL   Eosinophils Relative 3 %   Eosinophils Absolute 0.2 0.0 - 0.5 K/uL   Basophils Relative 1 %   Basophils Absolute 0.1 0.0 - 0.1 K/uL   Immature Granulocytes 1 %   Abs Immature Granulocytes 0.05 0.00 - 0.07 K/uL    Comment: Performed at [facility], [address], [place], [place] [address]  Technologist smear review     Status: None   Collection Time: 02/01/19 11:58 AM  Result Value Ref Range   WBC Morphology MORPHOLOGY UNREMARKABLE    RBC Morphology OCC POLYs     Comment: OCC OVALOCYTES   Tech Review Normal platelet morphology     Comment: PLATELETS APPEAR ADEQUATE Performed at [facility], [address], [place], [place] [address]     Radiology No results found.  Assessment/Plan Essential hypertension blood pressure control important in reducing the progression of atherosclerotic disease. On appropriate oral medications.   Diabetes mellitus type 2, uncontrolled blood glucose control important in reducing the progression of atherosclerotic disease. Also, involved in wound healing. On appropriate medications.  Hyperlipidemia lipid control important in reducing the progression of atherosclerotic disease. Continue statin therapy  Atherosclerosis of native arteries of the extremities with ulceration ([place]) Wounds have healed.  Pain has resolved.  Not even really having claudication at this point. Her noninvasive studies today showed normal waveforms and normal ABIs and digital pressures bilaterally.  Duplex shows no hemodynamically significant stenosis in either lower extremity of concern.  Continue current medical regimen.  Recheck in 6 months.    Leotis Pain, MD  02/05/2019 3:30 PM    This note was created with Dragon medical transcription system.  Any errors from dictation are  purely unintentional

## 2019-02-05 NOTE — Assessment & Plan Note (Signed)
Wounds have healed.  Pain has resolved.  Not even really having claudication at this point. Her noninvasive studies today showed normal waveforms and normal ABIs and digital pressures bilaterally.  Duplex shows no hemodynamically significant stenosis in either lower extremity of concern.  Continue current medical regimen.  Recheck in 6 months.

## 2019-02-05 NOTE — Telephone Encounter (Signed)
Called patient-to discuss results of the blood work-iron deficiency/CKD.  Recommend IV iron

## 2019-02-06 ENCOUNTER — Other Ambulatory Visit: Payer: Self-pay | Admitting: Internal Medicine

## 2019-02-06 DIAGNOSIS — D649 Anemia, unspecified: Secondary | ICD-10-CM

## 2019-02-06 DIAGNOSIS — D631 Anemia in chronic kidney disease: Secondary | ICD-10-CM

## 2019-02-06 NOTE — Addendum Note (Signed)
Addended by: Salley Scarlet on: 02/06/2019 02:00 PM   Modules accepted: Orders

## 2019-02-06 NOTE — Progress Notes (Signed)
Cbc added -per md order in 4 weeks

## 2019-02-06 NOTE — Progress Notes (Signed)
H/T -please inform patient that I would recommend IV iron treatment for anemia.  I left a message for her on 2/2.  Let me know if she has any questions-happy to talk to her.  C- Please schedule-Venofer weekly x4-start this week/next week.  Follow-up in 4 weeks-MD/lab-CBC/possible Venofer.

## 2019-02-11 ENCOUNTER — Ambulatory Visit (INDEPENDENT_AMBULATORY_CARE_PROVIDER_SITE_OTHER): Payer: Medicare Other | Admitting: Ophthalmology

## 2019-02-11 ENCOUNTER — Inpatient Hospital Stay: Payer: Medicare Other | Attending: Internal Medicine

## 2019-02-11 ENCOUNTER — Other Ambulatory Visit: Payer: Self-pay

## 2019-02-11 ENCOUNTER — Encounter (INDEPENDENT_AMBULATORY_CARE_PROVIDER_SITE_OTHER): Payer: Self-pay | Admitting: Ophthalmology

## 2019-02-11 VITALS — BP 164/71 | HR 91 | Temp 98.2°F | Resp 18

## 2019-02-11 DIAGNOSIS — Z961 Presence of intraocular lens: Secondary | ICD-10-CM

## 2019-02-11 DIAGNOSIS — H43391 Other vitreous opacities, right eye: Secondary | ICD-10-CM

## 2019-02-11 DIAGNOSIS — I129 Hypertensive chronic kidney disease with stage 1 through stage 4 chronic kidney disease, or unspecified chronic kidney disease: Secondary | ICD-10-CM | POA: Diagnosis not present

## 2019-02-11 DIAGNOSIS — H35033 Hypertensive retinopathy, bilateral: Secondary | ICD-10-CM

## 2019-02-11 DIAGNOSIS — N183 Chronic kidney disease, stage 3 unspecified: Secondary | ICD-10-CM | POA: Insufficient documentation

## 2019-02-11 DIAGNOSIS — D631 Anemia in chronic kidney disease: Secondary | ICD-10-CM

## 2019-02-11 DIAGNOSIS — H34831 Tributary (branch) retinal vein occlusion, right eye, with macular edema: Secondary | ICD-10-CM | POA: Diagnosis not present

## 2019-02-11 DIAGNOSIS — E1122 Type 2 diabetes mellitus with diabetic chronic kidney disease: Secondary | ICD-10-CM | POA: Diagnosis not present

## 2019-02-11 DIAGNOSIS — E113213 Type 2 diabetes mellitus with mild nonproliferative diabetic retinopathy with macular edema, bilateral: Secondary | ICD-10-CM

## 2019-02-11 DIAGNOSIS — H3581 Retinal edema: Secondary | ICD-10-CM | POA: Diagnosis not present

## 2019-02-11 DIAGNOSIS — H35371 Puckering of macula, right eye: Secondary | ICD-10-CM

## 2019-02-11 DIAGNOSIS — D649 Anemia, unspecified: Secondary | ICD-10-CM | POA: Diagnosis present

## 2019-02-11 DIAGNOSIS — I10 Essential (primary) hypertension: Secondary | ICD-10-CM | POA: Diagnosis not present

## 2019-02-11 DIAGNOSIS — I739 Peripheral vascular disease, unspecified: Secondary | ICD-10-CM | POA: Diagnosis not present

## 2019-02-11 DIAGNOSIS — H4710 Unspecified papilledema: Secondary | ICD-10-CM

## 2019-02-11 DIAGNOSIS — Z79899 Other long term (current) drug therapy: Secondary | ICD-10-CM | POA: Diagnosis not present

## 2019-02-11 MED ORDER — IRON SUCROSE 20 MG/ML IV SOLN
200.0000 mg | Freq: Once | INTRAVENOUS | Status: AC
  Administered 2019-02-11: 14:00:00 200 mg via INTRAVENOUS
  Filled 2019-02-11: qty 10

## 2019-02-11 MED ORDER — AFLIBERCEPT 2MG/0.05ML IZ SOLN FOR KALEIDOSCOPE
2.0000 mg | INTRAVITREAL | Status: AC | PRN
  Administered 2019-02-11: 12:00:00 2 mg via INTRAVITREAL

## 2019-02-11 MED ORDER — SODIUM CHLORIDE 0.9 % IV SOLN
Freq: Once | INTRAVENOUS | Status: AC
  Filled 2019-02-11: qty 250

## 2019-02-12 ENCOUNTER — Encounter (INDEPENDENT_AMBULATORY_CARE_PROVIDER_SITE_OTHER): Payer: Medicare Other | Admitting: Ophthalmology

## 2019-02-18 ENCOUNTER — Other Ambulatory Visit: Payer: Self-pay

## 2019-02-18 ENCOUNTER — Inpatient Hospital Stay: Payer: Medicare Other

## 2019-02-18 VITALS — BP 129/74 | HR 86 | Resp 18

## 2019-02-18 DIAGNOSIS — D631 Anemia in chronic kidney disease: Secondary | ICD-10-CM

## 2019-02-18 DIAGNOSIS — D649 Anemia, unspecified: Secondary | ICD-10-CM | POA: Diagnosis not present

## 2019-02-18 MED ORDER — SODIUM CHLORIDE 0.9 % IV SOLN
Freq: Once | INTRAVENOUS | Status: AC
  Filled 2019-02-18: qty 250

## 2019-02-18 MED ORDER — IRON SUCROSE 20 MG/ML IV SOLN
200.0000 mg | Freq: Once | INTRAVENOUS | Status: AC
  Administered 2019-02-18: 200 mg via INTRAVENOUS
  Filled 2019-02-18: qty 10

## 2019-02-20 DIAGNOSIS — Z8616 Personal history of COVID-19: Secondary | ICD-10-CM | POA: Insufficient documentation

## 2019-02-25 ENCOUNTER — Inpatient Hospital Stay: Payer: Medicare Other

## 2019-02-25 ENCOUNTER — Other Ambulatory Visit: Payer: Self-pay

## 2019-02-25 VITALS — BP 125/62 | HR 67 | Temp 97.1°F | Resp 16

## 2019-02-25 DIAGNOSIS — D631 Anemia in chronic kidney disease: Secondary | ICD-10-CM

## 2019-02-25 DIAGNOSIS — D649 Anemia, unspecified: Secondary | ICD-10-CM | POA: Diagnosis not present

## 2019-02-25 MED ORDER — IRON SUCROSE 20 MG/ML IV SOLN
200.0000 mg | Freq: Once | INTRAVENOUS | Status: AC
  Administered 2019-02-25: 200 mg via INTRAVENOUS
  Filled 2019-02-25: qty 10

## 2019-02-25 MED ORDER — SODIUM CHLORIDE 0.9 % IV SOLN
Freq: Once | INTRAVENOUS | Status: AC
  Filled 2019-02-25: qty 250

## 2019-03-06 ENCOUNTER — Inpatient Hospital Stay: Payer: Medicare Other

## 2019-03-06 ENCOUNTER — Inpatient Hospital Stay (HOSPITAL_BASED_OUTPATIENT_CLINIC_OR_DEPARTMENT_OTHER): Payer: Medicare Other | Admitting: Internal Medicine

## 2019-03-06 ENCOUNTER — Other Ambulatory Visit: Payer: Self-pay

## 2019-03-06 ENCOUNTER — Inpatient Hospital Stay: Payer: Medicare Other | Attending: Internal Medicine

## 2019-03-06 VITALS — BP 144/78 | HR 77

## 2019-03-06 DIAGNOSIS — I7025 Atherosclerosis of native arteries of other extremities with ulceration: Secondary | ICD-10-CM

## 2019-03-06 DIAGNOSIS — I129 Hypertensive chronic kidney disease with stage 1 through stage 4 chronic kidney disease, or unspecified chronic kidney disease: Secondary | ICD-10-CM | POA: Diagnosis present

## 2019-03-06 DIAGNOSIS — E1122 Type 2 diabetes mellitus with diabetic chronic kidney disease: Secondary | ICD-10-CM | POA: Insufficient documentation

## 2019-03-06 DIAGNOSIS — D649 Anemia, unspecified: Secondary | ICD-10-CM

## 2019-03-06 DIAGNOSIS — N183 Chronic kidney disease, stage 3 unspecified: Secondary | ICD-10-CM | POA: Insufficient documentation

## 2019-03-06 DIAGNOSIS — D631 Anemia in chronic kidney disease: Secondary | ICD-10-CM

## 2019-03-06 DIAGNOSIS — Z79899 Other long term (current) drug therapy: Secondary | ICD-10-CM | POA: Diagnosis not present

## 2019-03-06 LAB — CBC WITH DIFFERENTIAL/PLATELET
Abs Immature Granulocytes: 0.02 10*3/uL (ref 0.00–0.07)
Basophils Absolute: 0.1 10*3/uL (ref 0.0–0.1)
Basophils Relative: 1 %
Eosinophils Absolute: 0.2 10*3/uL (ref 0.0–0.5)
Eosinophils Relative: 3 %
HCT: 33.7 % — ABNORMAL LOW (ref 36.0–46.0)
Hemoglobin: 10.5 g/dL — ABNORMAL LOW (ref 12.0–15.0)
Immature Granulocytes: 0 %
Lymphocytes Relative: 26 %
Lymphs Abs: 1.9 10*3/uL (ref 0.7–4.0)
MCH: 26.4 pg (ref 26.0–34.0)
MCHC: 31.2 g/dL (ref 30.0–36.0)
MCV: 84.7 fL (ref 80.0–100.0)
Monocytes Absolute: 0.6 10*3/uL (ref 0.1–1.0)
Monocytes Relative: 8 %
Neutro Abs: 4.6 10*3/uL (ref 1.7–7.7)
Neutrophils Relative %: 62 %
Platelets: 283 10*3/uL (ref 150–400)
RBC: 3.98 MIL/uL (ref 3.87–5.11)
RDW: 17.8 % — ABNORMAL HIGH (ref 11.5–15.5)
WBC: 7.3 10*3/uL (ref 4.0–10.5)
nRBC: 0 % (ref 0.0–0.2)

## 2019-03-06 MED ORDER — SODIUM CHLORIDE 0.9 % IV SOLN
Freq: Once | INTRAVENOUS | Status: AC
  Filled 2019-03-06: qty 250

## 2019-03-06 MED ORDER — IRON SUCROSE 20 MG/ML IV SOLN
200.0000 mg | Freq: Once | INTRAVENOUS | Status: AC
  Administered 2019-03-06: 200 mg via INTRAVENOUS
  Filled 2019-03-06: qty 10

## 2019-03-06 NOTE — Assessment & Plan Note (Addendum)
#  Anemia hemoglobin around 9/likely secondary to CKD/iron deficiency.  Hemoglobin today is 10.5.  Improved from 9 s/p IV infusions.  Proceed with Venofer today.  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  Awaiting EGD/colonoscopy [[facility]] this month.   # DISPOSITION: #  Venofer today # follow up 3 months- MD; labs- cbc/bmp; possible venofer- Dr.B

## 2019-03-06 NOTE — Progress Notes (Signed)
[facility] CONSULT NOTE  Patient Care Team: Miller, Mark F, MD as PCP - General (Internal Medicine) Rein, Matthew Gordon, MD as Referring Physician (Gastroenterology) Brahmanday, Govinda R, MD as Consulting Physician (Hematology and Oncology)  CHIEF COMPLAINTS/PURPOSE OF CONSULTATION: Anemia   HEMATOLOGY HISTORY  # ANEMIA- Jan 2021- 8.8/ferritin 11 [PCP]; N-WBC/platelets? IDA vs other- EGD-2015/colonoscopy-? 2015; 2020- [Dr.Skulskie] ; capsule-2016- ? Small AVMs [[facility]] Bone marrow Biopsy-none; NOV 2020- CT- no liver/spleen; awaiting EGD colonoscopy March 2021  # CKD- stage III [GFR-40s]  HISTORY OF PRESENTING ILLNESS:  Cheryl Leonard 73 y.o.  female anemia iron deficiency-question CKD versus GI is here for follow-up.  Patient received 3 IV iron infusions of Venofer for the last 2 months.  Her energy levels are slightly improved but not significantly.  She continues to get short of breath on exertion.  She is awaiting to have a EGD/colonoscopy in March 2020.  Review of Systems  Constitutional: Positive for malaise/fatigue. Negative for chills, diaphoresis and fever.  HENT: Negative for nosebleeds and sore throat.   Eyes: Negative for double vision.  Respiratory: Positive for shortness of breath. Negative for cough, hemoptysis, sputum production and wheezing.   Cardiovascular: Negative for chest pain, palpitations, orthopnea and leg swelling.  Gastrointestinal: Negative for abdominal pain, blood in stool, constipation, diarrhea, heartburn, melena, nausea and vomiting.  Genitourinary: Negative for dysuria, frequency and urgency.  Musculoskeletal: Positive for joint pain.  Skin: Negative.  Negative for itching and rash.  Neurological: Negative for dizziness, tingling, focal weakness, weakness and headaches.  Endo/Heme/Allergies: Does not bruise/bleed easily.  Psychiatric/Behavioral: Negative for depression. The patient is not nervous/anxious and does not have  insomnia.     MEDICAL HISTORY:  Past Medical History:  Diagnosis Date  . Anemia   . Anxiety   . Arthritis   . Cataracts, both eyes   . Diabetic retinopathy (HCC)    NPDR OU  . GERD (gastroesophageal reflux disease)   . Gout   . Headache    h/o migraines  . History of fracture of patella    right knee  . History of positive PPD    Patient always shows positive  . Hyperlipidemia   . Hypertension   . Hypertensive retinopathy    OU  . Hypothyroidism   . Lichen sclerosus 12/30/2013   of vulva  . Metatarsal fracture   . Neuropathy   . Peripheral vascular disease (HCC)   . Polyneuropathy    numbness and tingling in feet and toes  . Renal insufficiency   . Sleep apnea    does not use cpap-lost weight   . Type 2 diabetes mellitus, uncontrolled (HCC)     SURGICAL HISTORY: Past Surgical History:  Procedure Laterality Date  . APPENDECTOMY    . BREAST REDUCTION SURGERY  2001  . CATARACT EXTRACTION    . CESAREAN SECTION  1976  . COLONOSCOPY  03/05/2013   Nml - due for repeat 03/06/2018  . DILATION AND CURETTAGE OF UTERUS  1989  . ENDARTERECTOMY FEMORAL Bilateral 03/09/2018   Procedure: ENDARTERECTOMY FEMORAL;  Surgeon: Dew, Jason S, MD;  Location: [facility];  Service: Vascular;  Laterality: Bilateral;  . ENDARTERECTOMY POPLITEAL Left 03/09/2018   Procedure: ENDARTERECTOMY POPLITEAL AND SFA;  Surgeon: Dew, Jason S, MD;  Location: [facility];  Service: Vascular;  Laterality: Left;  . ESOPHAGOGASTRODUODENOSCOPY  03/05/2013  . EYE SURGERY    . Eyelid Surgery  2012  . INTRAMEDULLARY (IM) NAIL INTERTROCHANTERIC Left 10/30/2015   Procedure: INTRAMEDULLARY (IM)   NAIL INTERTROCHANTRIC ;  Surgeon: Hessie Knows, MD;  Location: [facility];  Service: Orthopedics;  Laterality: Left;  . KYPHOPLASTY N/A 10/25/2018   Procedure: L4 KYPHOPLASTY;  Surgeon: Hessie Knows, MD;  Location: [facility];  Service: Orthopedics;  Laterality: N/A;  . LAPAROSCOPIC HYSTERECTOMY  2000   total  . LOWER EXTREMITY  ANGIOGRAPHY Left 03/08/2017   Procedure: LOWER EXTREMITY ANGIOGRAPHY;  Surgeon: Algernon Huxley, MD;  Location: [place] [facility];  Service: Cardiovascular;  Laterality: Left;  . LOWER EXTREMITY ANGIOGRAPHY Left 10/30/2017   Procedure: LOWER EXTREMITY ANGIOGRAPHY;  Surgeon: Algernon Huxley, MD;  Location: [place] [facility];  Service: Cardiovascular;  Laterality: Left;  . LOWER EXTREMITY ANGIOGRAPHY Right 03/08/2018   Procedure: LOWER EXTREMITY ANGIOGRAPHY;  Surgeon: Algernon Huxley, MD;  Location: [place] [facility];  Service: Cardiovascular;  Laterality: Right;  . LOWER EXTREMITY ANGIOGRAPHY Left 10/01/2018   Procedure: LOWER EXTREMITY ANGIOGRAPHY;  Surgeon: Algernon Huxley, MD;  Location: [place] [facility];  Service: Cardiovascular;  Laterality: Left;  . LOWER EXTREMITY ANGIOGRAPHY Right 10/08/2018   Procedure: LOWER EXTREMITY ANGIOGRAPHY;  Surgeon: Algernon Huxley, MD;  Location: [place] [facility];  Service: Cardiovascular;  Laterality: Right;  . PERIPHERAL VASCULAR INTERVENTION  03/08/2018   Procedure: PERIPHERAL VASCULAR INTERVENTION;  Surgeon: Algernon Huxley, MD;  Location: [place] [facility];  Service: Cardiovascular;;  . REDUCTION MAMMAPLASTY  1997  . SACROPLASTY N/A 10/25/2018   Procedure: S1 SACROPLASTY;  Surgeon: Hessie Knows, MD;  Location: [facility];  Service: Orthopedics;  Laterality: N/A;    SOCIAL HISTORY: Social History   Socioeconomic History  . Marital status: Married    Spouse name: Jenny Reichmann  . Number of children: 3  . Years of education: Not on file  . Highest education level: Not on file  Occupational History  . Occupation: Retail banker  Tobacco Use  . Smoking status: Former Smoker    Packs/day: 1.00    Years: 20.00    Pack years: 20.00    Types: Cigarettes    Quit date: 03/07/1996    Years since quitting: 23.0  . Smokeless tobacco: Never Used  . Tobacco comment: started smoking at age 8  Substance and Sexual Activity  . Alcohol use: No    Alcohol/week: 0.0  standard drinks  . Drug use: No  . Sexual activity: Yes    Partners: Male    Birth control/protection: Surgical  Other Topics Concern  . Not on file  Social History Narrative   Lives in [place]; with husband; quit > 20 years; no alcohol; used to work at [employer] at [employer].    Social Determinants of Health   Financial Resource Strain: Low Risk   . Difficulty of Paying Living Expenses: Not very hard  Food Insecurity: No Food Insecurity  . Worried About Charity fundraiser in the Last Year: Never true  . Ran Out of Food in the Last Year: Never true  Transportation Needs: Unknown  . Lack of Transportation (Medical): No  . Lack of Transportation (Non-Medical): Not on file  Physical Activity: Unknown  . Days of Exercise per Week: 2 days  . Minutes of Exercise per Session: Not on file  Stress: Stress Concern Present  . Feeling of Stress : To some extent  Social Connections: Unknown  . Frequency of Communication with Friends and Family: More than three times a week  . Frequency of Social Gatherings with Friends and Family: Not on file  . Attends Religious Services:  Not on file  . Active Member of Clubs or Organizations: Not on file  . Attends Club or Organization Meetings: Not on file  . Marital Status: Married  Intimate Partner Violence: Not At Risk  . Fear of Current or Ex-Partner: No  . Emotionally Abused: No  . Physically Abused: No  . Sexually Abused: No    FAMILY HISTORY: Family History  Problem Relation Age of Onset  . Coronary artery disease Father   . Heart attack Father   . Coronary artery disease Mother   . Heart attack Mother   . Ovarian cancer Sister 43       sister had hormonal therapy for IVF txs-which increased risk factor for ovarian cancer  . Breast cancer Neg Hx     ALLERGIES:  has No Known Allergies.  MEDICATIONS:  Current Outpatient Medications  Medication Sig Dispense Refill  . gabapentin (NEURONTIN) 100 MG capsule Take 1 capsule by mouth  daily.    . acetaminophen (TYLENOL) 500 MG tablet Take 1,000 mg by mouth every 6 (six) hours as needed for moderate pain or headache.    . Aflibercept (EYLEA) 2 MG/0.05ML SOLN 2 mg by Intravitreal route every 5 (five) weeks.     . ALPRAZolam (XANAX) 0.25 MG tablet Take 0.25 mg by mouth daily as needed for anxiety or sleep.     . Ascorbic Acid (VITAMIN C) 1000 MG tablet Take 1,000 mg by mouth daily.    . aspirin EC 81 MG tablet Take 81 mg by mouth daily.     . buPROPion (WELLBUTRIN XL) 150 MG 24 hr tablet Take 150 mg by mouth daily.     . BYSTOLIC 5 MG tablet Take 5 mg by mouth every morning.   2  . calcium carbonate (TUMS EX) 750 MG chewable tablet Chew 2,250 mg by mouth daily as needed for heartburn.    . chlorhexidine (PERIDEX) 0.12 % solution Use as directed 15 mLs in the mouth or throat 2 (two) times daily.   0  . cholecalciferol (VITAMIN D) 1000 units tablet Take 1,000 Units by mouth 2 (two) times daily.    . CORAL CALCIUM PO Take 1 tablet by mouth 2 (two) times daily.     . denosumab (PROLIA) 60 MG/ML SOLN injection Inject 60 mg into the skin every 6 (six) months.     . DENTA 5000 PLUS 1.1 % CREA dental cream Place 1 application onto teeth 2 (two) times daily. as directed  99  . DULoxetine (CYMBALTA) 60 MG capsule Take 1 capsule (60 mg total) by mouth 2 (two) times daily.  3  . ELIQUIS 5 MG TABS tablet TAKE 1 TABLET BY MOUTH TWICE A DAY 60 tablet 2  . famotidine (PEPCID) 40 MG tablet Take 40 mg by mouth 2 (two) times daily.     . ferrous gluconate (FERGON) 324 MG tablet Take 324 mg by mouth daily with breakfast.    . furosemide (LASIX) 20 MG tablet Take 20 mg by mouth daily as needed for fluid or edema.     . Insulin Pen Needle (B-D UF III MINI PEN NEEDLES) 31G X 5 MM MISC once daily.    . levothyroxine (SYNTHROID, LEVOTHROID) 100 MCG tablet Take 100 mcg by mouth daily before breakfast.   3  . lisinopril (PRINIVIL,ZESTRIL) 10 MG tablet Take 10 mg by mouth every morning.     . Magnesium  500 MG TABS Take 500 mg by mouth every morning.     . metFORMIN (  GLUCOPHAGE) 1000 MG tablet Take 1,000 mg by mouth 2 (two) times daily.    . metoprolol succinate (TOPROL-XL) 50 MG 24 hr tablet Take by mouth.    . Multiple Vitamin (MULTIVITAMIN WITH MINERALS) TABS tablet Take 1 tablet by mouth daily.    . pantoprazole (PROTONIX) 40 MG tablet Take 40 mg by mouth every morning.     . rosuvastatin (CRESTOR) 20 MG tablet Take 20 mg by mouth every morning.     . traMADol (ULTRAM) 50 MG tablet Take 1 tablet (50 mg total) by mouth every 6 (six) hours as needed. 20 tablet 0  . TRESIBA FLEXTOUCH 200 UNIT/ML SOPN Inject 30 Units as directed at bedtime.   5  . TYMLOS 3120 MCG/1.56ML SOPN     . VASCEPA 1 g CAPS Take 1 capsule by mouth 2 (two) times daily.    . vitamin B-12 (CYANOCOBALAMIN) 1000 MCG tablet Take 1,000 mcg by mouth daily.    . vitamin E 400 UNIT capsule Take 400 Units by mouth daily.    . zolpidem (AMBIEN) 5 MG tablet Take 1 tablet (5 mg total) by mouth at bedtime. (Patient taking differently: Take 5 mg by mouth at bedtime as needed. ) 30 tablet 0   Current Facility-Administered Medications  Medication Dose Route Frequency Provider Last Rate Last Admin  . aflibercept (EYLEA) SOLN 2 mg  2 mg Intravitreal  Zamora, Brian, MD   2 mg at 06/27/17 1307  . aflibercept (EYLEA) SOLN 2 mg  2 mg Intravitreal  Zamora, Brian, MD   2 mg at 07/26/17 1702  . aflibercept (EYLEA) SOLN 2 mg  2 mg Intravitreal  Zamora, Brian, MD   2 mg at 09/06/17 1700  . aflibercept (EYLEA) SOLN 2 mg  2 mg Intravitreal  Zamora, Brian, MD   2 mg at 11/01/17 1550  . aflibercept (EYLEA) SOLN 2 mg  2 mg Intravitreal  Zamora, Brian, MD   2 mg at 01/03/18 1416  . Bevacizumab (AVASTIN) SOLN 1.25 mg  1.25 mg Intravitreal  Zamora, Brian, MD   1.25 mg at 02/10/17 0953  . Bevacizumab (AVASTIN) SOLN 1.25 mg  1.25 mg Intravitreal  Zamora, Brian, MD   1.25 mg at 03/13/17 1717  . Bevacizumab (AVASTIN) SOLN 1.25 mg  1.25 mg Intravitreal   Zamora, Brian, MD   1.25 mg at 04/11/17 1626  . Bevacizumab (AVASTIN) SOLN 1.25 mg  1.25 mg Intravitreal  Zamora, Brian, MD   1.25 mg at 02/12/18 2154  . insulin aspart (novoLOG) injection 0-15 Units  0-15 Units Subcutaneous TID WC [name], [name], PA-C      . insulin aspart (novoLOG) injection 0-5 Units  0-5 Units Subcutaneous QHS [name], [name], PA-C          PHYSICAL EXAMINATION:   Vitals:   03/06/19 1301  Temp: 98.2 F (36.8 C)   Filed Weights   03/06/19 1301  Weight: 167 lb (75.8 kg)    Physical Exam  Constitutional: She is oriented to person, place, and time and well-developed, well-nourished, and in no distress.  HENT:  Head: Normocephalic and atraumatic.  Mouth/Throat: Oropharynx is clear and moist. No oropharyngeal exudate.  Eyes: Pupils are equal, round, and reactive to light.  Cardiovascular: Normal rate and regular rhythm.  Pulmonary/Chest: Effort normal and breath sounds normal. No respiratory distress. She has no wheezes.  Abdominal: Soft. Bowel sounds are normal. She exhibits no distension and no mass. There is no abdominal tenderness. There is no rebound and no guarding.    Musculoskeletal:        General: No tenderness or edema. Normal range of motion.     Cervical back: Normal range of motion and neck supple.  Neurological: She is alert and oriented to person, place, and time.  Skin: Skin is warm.  Psychiatric: Affect normal.    LABORATORY DATA:  I have reviewed the data as listed Lab Results  Component Value Date   WBC 7.3 03/06/2019   HGB 10.5 (L) 03/06/2019   HCT 33.7 (L) 03/06/2019   MCV 84.7 03/06/2019   PLT 283 03/06/2019   Recent Labs    03/11/18 0747 03/11/18 0747 03/12/18 0442 03/12/18 0442 10/01/18 1033 10/08/18 0811 02/01/19 1156  NA 132*  --  138  --   --   --  136  K 4.2  --  4.0  --   --   --  4.5  CL 103  --  108  --   --   --  104  CO2 19*  --  23  --   --   --  22  GLUCOSE 140*  --  80  --   --   --  323*  BUN  28*   < > 21   < > 21 24* 23  CREATININE 1.50*   < > 1.09*   < > 1.35* 1.68* 1.26*  CALCIUM 9.0  --  8.3*  --   --   --  9.2  GFRNONAA 34*   < > 51*   < > 39* 30* 42*  GFRAA 40*   < > 59*   < > 45* 35* 49*  PROT  --   --   --   --   --   --  7.2  ALBUMIN  --   --   --   --   --   --  3.9  AST  --   --   --   --   --   --  24  ALT  --   --   --   --   --   --  17  ALKPHOS  --   --   --   --   --   --  65  BILITOT  --   --   --   --   --   --  0.6   < > = values in this interval not displayed.     VAS [place] ABI WITH/WO TBI  Result Date: 02/05/2019 LOWER EXTREMITY DOPPLER STUDY Indications: Peripheral artery disease.  Vascular Interventions: On 03/08/2017 Aortogram - PTA of the left SFA with                         angioplasty/balloon. Stent placement to the left SFA.                         PTA of the left CIA with angioplasty/balloon.                         10/30/2017 PTA of left SFA and proximal popliteal                         artery. Left SFA and proximal popliteal artery stent.  10/01/2018:Aortogram and Selective Left Lower Extremity                         Angiogram. Thrombolytic therapy TPA instilled in the                         Left SFA and Popliteal Artery. Mechanical Thrombectomy                         to the Left SFA and Popliteal Artery. PTA of the Left                         External Iliac Artery and CFA. PTA of the Left SFA and                         Proximal Popliteal Artery. Stent placement to the                         Popliteal Artery and Distal SFA. Stent placed to the                         Left SFA.                          10/08/2018: Aortogram and Selctive Right Lower Extremity                         Angiogram. Mechanical Thrombectomy of the Right SFA,                         Popliteal Artery and Posterior tibial Artery. PTA of the                         Right SFA and Proximal Popliteal Artery. Stent placement                         to the Distal  SFAand Proximal Popliteal Artery. Stent                         placement to the Right SFA. PTA of the Right Posterior                         Tibial Artery. placement. Comparison Study: 11/26/2018 Performing Technologist: Solomon Mcclary RVS  Examination Guidelines: A complete evaluation includes at minimum, Doppler waveform signals and systolic blood pressure reading at the level of bilateral brachial, anterior tibial, and posterior tibial arteries, when vessel segments are accessible. Bilateral testing is considered an integral part of a complete examination. Photoelectric Plethysmograph (PPG) waveforms and toe systolic pressure readings are included as required and additional duplex testing as needed. Limited examinations for reoccurring indications may be performed as noted.  ABI Findings: +---------+------------------+-----+--------+--------+ Right    Rt Pressure (mmHg)IndexWaveformComment  +---------+------------------+-----+--------+--------+ Brachial 165                                     +---------+------------------+-----+--------+--------+ ATA      198                 1.14 biphasic         +---------+------------------+-----+--------+--------+ PTA      193               1.12 biphasic         +---------+------------------+-----+--------+--------+ Great Toe171               0.99 Normal           +---------+------------------+-----+--------+--------+ +---------+------------------+-----+--------+-------+ Left     Lt Pressure (mmHg)IndexWaveformComment +---------+------------------+-----+--------+-------+ Brachial 173                                    +---------+------------------+-----+--------+-------+ ATA      186               1.08 biphasic        +---------+------------------+-----+--------+-------+ PTA      190               1.10 biphasic        +---------+------------------+-----+--------+-------+ Alcario Drought               1.12 Normal           +---------+------------------+-----+--------+-------+ +-------+-----------+-----------+------------+------------+ ABI/TBIToday's ABIToday's TBIPrevious ABIPrevious TBI +-------+-----------+-----------+------------+------------+ Right  1.14       .99        1.08                     +-------+-----------+-----------+------------+------------+ Left   1.10       1.12       1.01                     +-------+-----------+-----------+------------+------------+  Bilateral ABIs appear essentially unchanged compared to prior study on 11/26/2018.  Summary: Right: Resting right ankle-brachial index is within normal range. No evidence of significant right lower extremity arterial disease. The right toe-brachial index is normal. Left: Resting left ankle-brachial index is within normal range. No evidence of significant left lower extremity arterial disease. The left toe-brachial index is normal.  *See table(s) above for measurements and observations.  Electronically signed by Leotis Pain MD on 02/05/2019 at 3:50:33 PM.    Final    Intravitreal Injection, Pharmacologic Agent - OD - Right Eye  Result Date: 02/11/2019 Time Out 02/11/2019. 10:03 AM. Confirmed correct patient, procedure, site, and patient consented. Anesthesia Topical anesthesia was used. Anesthetic medications included Lidocaine 2%, Proparacaine 0.5%. Procedure Preparation included 5% betadine to ocular surface, eyelid speculum. A supplied (32 g) needle was used. Injection: 2 mg aflibercept Alfonse Flavors) SOLN   NDC: [id number], Lot: [id number], Expiration date: 07/24/2019   Route: Intravitreal, Site: Right Eye, Waste: 0.05 mL Post-op Post injection exam found visual acuity of at least counting fingers. The patient tolerated the procedure well. There were no complications. The patient received written and verbal post procedure care education.   OCT, Retina - OU - Both Eyes  Result Date: 02/11/2019 Right Eye Quality was good. Central Foveal Thickness: 683.  Progression has worsened. Findings include abnormal foveal contour, epiretinal membrane, intraretinal fluid, no SRF (Persistent IRF - slightly improved, +ERM). Left Eye Quality was good. Central Foveal Thickness: 283. Progression has been stable. Findings include normal foveal contour, no IRF, no SRF (Trace ERM). Notes *Images captured and stored on drive Diagnosis / Impression: OD: interval worsening of IRF and CME, +ERM OS: NFP; no IRF/SRF--stable, trace ERM Clinical management: See below Abbreviations: NFP - Normal foveal profile. CME -  cystoid macular edema. PED - pigment epithelial detachment. IRF - intraretinal fluid. SRF - subretinal fluid. EZ - ellipsoid zone. ERM - epiretinal membrane. ORA - outer retinal atrophy. ORT - outer retinal tubulation. SRHM - subretinal hyper-reflective material   VAS US LOWER EXTREMITY ARTERIAL DUPLEX  Result Date: 02/05/2019 LOWER EXTREMITY ARTERIAL DUPLEX STUDY Indications: Peripheral artery disease, and S/P Angioplasty with stent.  Vascular Interventions: On 03/08/2017 Aortogram - PTA of the left SFA with                         angioplasty/balloon. Stent placement to the left SFA.                         PTA of the left CIA with angioplasty/balloon.                         10/30/2017 PTA of left SFA and proximal popliteal                         artery. Left SFA and proximal popliteal artery stent.                          10/01/2018:Aortogram and Selective Left Lower Extremity                         Angiogram. Thrombolytic therapy TPA instilled in the                         Left SFA and Popliteal Artery. Mechanical Thrombectomy                         to the Left SFA and Popliteal Artery. PTA of the Left                         External Iliac Artery and CFA. PTA of the Left SFA and                         Proximal Popliteal Artery. Stent placement to the                         Popliteal Artery and Distal SFA. Stent placed to the                         Left SFA.                           10/08/2018: Aortogram and Selctive Right Lower Extremity                         Angiogram. Mechanical Thrombectomy of the Right SFA,                         Popliteal Artery and Posterior tibial Artery. PTA of the                         Right SFA and Proximal Popliteal Artery. Stent placement                           to the Distal SFAand Proximal Popliteal Artery. Stent                         placement to the Right SFA. PTA of the Right Posterior                         Tibial Artery. placement. Current ABI:            Rt1.14,Lt 1.10 Comparison Study: 09/25/2018 Performing Technologist: Solomon Mcclary RVS  Examination Guidelines: A complete evaluation includes B-mode imaging, spectral Doppler, color Doppler, and power Doppler as needed of all accessible portions of each vessel. Bilateral testing is considered an integral part of a complete examination. Limited examinations for reoccurring indications may be performed as noted.  +-----------+--------+-----+--------+---------+--------+ RIGHT      PSV cm/sRatioStenosisWaveform Comments +-----------+--------+-----+--------+---------+--------+ CFA Distal 99                   biphasic          +-----------+--------+-----+--------+---------+--------+ DFA        74                   biphasic          +-----------+--------+-----+--------+---------+--------+ SFA Prox   80                   biphasic Stent    +-----------+--------+-----+--------+---------+--------+ SFA Mid    90                   biphasic          +-----------+--------+-----+--------+---------+--------+ SFA Distal 97                   biphasic          +-----------+--------+-----+--------+---------+--------+ POP Distal 127                  triphasic         +-----------+--------+-----+--------+---------+--------+ ATA Distal 77                   biphasic          +-----------+--------+-----+--------+---------+--------+ PTA Distal 96                    biphasic          +-----------+--------+-----+--------+---------+--------+ PERO Distal84                   biphasic          +-----------+--------+-----+--------+---------+--------+  +-----------+--------+-----+--------+--------+--------+ LEFT       PSV cm/sRatioStenosisWaveformComments +-----------+--------+-----+--------+--------+--------+ CFA Distal 92                   biphasic         +-----------+--------+-----+--------+--------+--------+ DFA        42                   biphasic         +-----------+--------+-----+--------+--------+--------+ SFA Prox   75                   biphasic         +-----------+--------+-----+--------+--------+--------+ SFA Mid    70                   biphasic         +-----------+--------+-----+--------+--------+--------+ SFA Distal 62                     biphasic         +-----------+--------+-----+--------+--------+--------+ POP Distal 124                  biphasic         +-----------+--------+-----+--------+--------+--------+ ATA Distal 61                   biphasic         +-----------+--------+-----+--------+--------+--------+ PTA Distal 112                  biphasic         +-----------+--------+-----+--------+--------+--------+ PERO Distal42                   biphasic         +-----------+--------+-----+--------+--------+--------+  Summary: Right: Imaging and Waveforms obtained throughout in the Right Lower Extremity. Biphasic Waveforms obtained throughout. Left: Imaging and Waveforms obtained throughout in the Left Lower Extremity. Biphasic Waveforms obtained throughout.  See table(s) above for measurements and observations. Electronically signed by Leotis Pain MD on 02/05/2019 at 3:50:36 PM.    Final     Normocytic anemia #Anemia hemoglobin around 9/likely secondary to CKD/iron deficiency.  Hemoglobin today is 10.5.  Improved from 9 s/p IV infusions.  Proceed with Venofer  today.  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  Awaiting EGD/colonoscopy [[facility]] this month.   # DISPOSITION: #  Venofer today # follow up 3 months- MD; labs- cbc/bmp; possible venofer- Dr.B   All questions were answered. The patient knows to call the clinic with any problems, questions or concerns.    Cammie Sickle, MD 03/06/2019 1:52 PM

## 2019-03-11 ENCOUNTER — Other Ambulatory Visit (INDEPENDENT_AMBULATORY_CARE_PROVIDER_SITE_OTHER): Payer: Self-pay | Admitting: Vascular Surgery

## 2019-03-11 NOTE — Progress Notes (Signed)
[facility] Note  03/12/2019     CHIEF COMPLAINT Patient presents for Retina Follow Up   HISTORY OF PRESENT ILLNESS: Cheryl Leonard is a 74 y.o. female who presents to the clinic today for:   HPI    Retina Follow Up    Patient presents with  CRVO/BRVO.  In right eye.  This started months ago.  Severity is moderate.  Duration of 4 weeks.  Since onset it is gradually improving.  I, the attending physician,  performed the HPI with the patient and updated documentation appropriately.          Comments    74 y/o female pt here for 4 wk f/u for BRVO w/mac edema OD.  Feels VA OD is about the same.  Denies pain, flashes, floaters.  AT prn OU.  BS 175 this a.m.  A1C 7.0.       Last edited by Bernarda Caffey, MD on 03/12/2019  3:50 PM. (History)    Patient states her right eye is "much better"  Referring physician: Rusty Aus, MD [place],  [place] [address]  HISTORICAL INFORMATION:   Selected notes from the MEDICAL RECORD NUMBER Referred by Dr. Marvel Plan for concern of DME OD Lab Results  Component Value Date   HGBA1C 5.9 (H) 10/29/2015       CURRENT MEDICATIONS: Current Outpatient Medications (Ophthalmic Drugs)  Medication Sig  . Aflibercept (EYLEA) 2 MG/0.05ML SOLN 2 mg by Intravitreal route every 5 (five) weeks.    Current Facility-Administered Medications (Ophthalmic Drugs)  Medication Route  . aflibercept (EYLEA) SOLN 2 mg Intravitreal  . aflibercept (EYLEA) SOLN 2 mg Intravitreal  . aflibercept (EYLEA) SOLN 2 mg Intravitreal  . aflibercept (EYLEA) SOLN 2 mg Intravitreal  . aflibercept (EYLEA) SOLN 2 mg Intravitreal   Current Outpatient Medications (Other)  Medication Sig  . acetaminophen (TYLENOL) 500 MG tablet Take 1,000 mg by mouth every 6 (six) hours as needed for moderate pain or headache.  . ALPRAZolam (XANAX) 0.25 MG tablet Take 0.25 mg by mouth daily as needed for anxiety  or sleep.   [name] amLODipine (NORVASC) 5 MG tablet Take by mouth.  . Ascorbic Acid (VITAMIN C) 1000 MG tablet Take 1,000 mg by mouth daily.  [name] aspirin EC 81 MG tablet Take 81 mg by mouth daily.   [name] buPROPion (WELLBUTRIN XL) 150 MG 24 hr tablet Take 150 mg by mouth daily.   [name] buPROPion (WELLBUTRIN XL) 300 MG 24 hr tablet Take 300 mg by mouth daily.  [name] BYSTOLIC 5 MG tablet Take 5 mg by mouth every morning.   . calcium carbonate (TUMS EX) 750 MG chewable tablet Chew 2,250 mg by mouth daily as needed for heartburn.  . chlorhexidine (PERIDEX) 0.12 % solution Use as directed 15 mLs in the mouth or throat 2 (two) times daily.   . cholecalciferol (VITAMIN D) 1000 units tablet Take 1,000 Units by mouth 2 (two) times daily.  [name] CORAL CALCIUM PO Take 1 tablet by mouth 2 (two) times daily.   [name] denosumab (PROLIA) 60 MG/ML SOLN injection Inject 60 mg into the skin every 6 (six) months.   . DENTA 5000 PLUS 1.1 % CREA dental cream Place 1 application onto teeth 2 (two) times daily. as directed  . DULoxetine (CYMBALTA) 60 MG capsule Take 1 capsule (60 mg total) by mouth 2 (two) times daily.  [name] ELIQUIS 5 MG TABS tablet TAKE 1 TABLET  BY MOUTH TWICE A DAY  . famotidine (PEPCID) 40 MG tablet Take 40 mg by mouth 2 (two) times daily.   . ferrous gluconate (FERGON) 324 MG tablet Take 324 mg by mouth daily with breakfast.  . furosemide (LASIX) 20 MG tablet Take 20 mg by mouth daily as needed for fluid or edema.   . gabapentin (NEURONTIN) 100 MG capsule Take 1 capsule by mouth daily.  . Insulin Pen Needle (B-D UF III MINI PEN NEEDLES) 31G X 5 MM MISC once daily.  [name] levothyroxine (SYNTHROID, LEVOTHROID) 100 MCG tablet Take 100 mcg by mouth daily before breakfast.   . lisinopril (PRINIVIL,ZESTRIL) 10 MG tablet Take 10 mg by mouth every morning.   . Magnesium 500 MG TABS Take 500 mg by mouth every morning.   . metoprolol succinate (TOPROL-XL) 50 MG 24 hr tablet Take by mouth.  . mirtazapine (REMERON) 15 MG tablet Take by  mouth.  . Multiple Vitamin (MULTIVITAMIN WITH MINERALS) TABS tablet Take 1 tablet by mouth daily.  . pantoprazole (PROTONIX) 40 MG tablet Take 40 mg by mouth every morning.   . rosuvastatin (CRESTOR) 20 MG tablet Take 20 mg by mouth every morning.   . traMADol (ULTRAM) 50 MG tablet Take 1 tablet (50 mg total) by mouth every 6 (six) hours as needed.  . TRESIBA FLEXTOUCH 200 UNIT/ML SOPN Inject 30 Units as directed at bedtime.   . TYMLOS 3120 MCG/1.56ML SOPN   . VASCEPA 1 g CAPS Take 1 capsule by mouth 2 (two) times daily.  . vitamin B-12 (CYANOCOBALAMIN) 1000 MCG tablet Take 1,000 mcg by mouth daily.  . vitamin E 400 UNIT capsule Take 400 Units by mouth daily.  [name] zolpidem (AMBIEN) 5 MG tablet Take 1 tablet (5 mg total) by mouth at bedtime. (Patient taking differently: Take 5 mg by mouth at bedtime as needed. )   Current Facility-Administered Medications (Other)  Medication Route  . Bevacizumab (AVASTIN) SOLN 1.25 mg Intravitreal  . Bevacizumab (AVASTIN) SOLN 1.25 mg Intravitreal  . Bevacizumab (AVASTIN) SOLN 1.25 mg Intravitreal  . Bevacizumab (AVASTIN) SOLN 1.25 mg Intravitreal  . insulin aspart (novoLOG) injection 0-15 Units Subcutaneous  . insulin aspart (novoLOG) injection 0-5 Units Subcutaneous      REVIEW OF SYSTEMS: ROS    Positive for: Endocrine, Cardiovascular, Eyes   Negative for: Constitutional, Gastrointestinal, Neurological, Skin, Genitourinary, Musculoskeletal, HENT, Respiratory, Psychiatric, Allergic/Imm, Heme/Lymph   Last edited by Matthew Folks, COA on 03/12/2019  2:43 PM. (History)       ALLERGIES No Known Allergies  PAST MEDICAL HISTORY Past Medical History:  Diagnosis Date  . Anemia   . Anxiety   . Arthritis   . Cataracts, both eyes   . Diabetic retinopathy ([place])    NPDR OU  . GERD (gastroesophageal reflux disease)   . Gout   . Headache    h/o migraines  . History of fracture of patella    right knee  . History of positive PPD    Patient always  shows positive  . Hyperlipidemia   . Hypertension   . Hypertensive retinopathy    OU  . Hypothyroidism   . Lichen sclerosus 92/33/0076   of vulva  . Metatarsal fracture   . Neuropathy   . Peripheral vascular disease ([place])   . Polyneuropathy    numbness and tingling in feet and toes  . Renal insufficiency   . Sleep apnea    does not use cpap-lost weight   . Type 2 diabetes mellitus,  uncontrolled [facility])    Past Surgical History:  Procedure Laterality Date  . APPENDECTOMY    . BREAST REDUCTION SURGERY  2001  . CATARACT EXTRACTION    . CESAREAN SECTION  1976  . COLONOSCOPY  03/05/2013   Nml - due for repeat 03/06/2018  . DILATION AND CURETTAGE OF UTERUS  1989  . ENDARTERECTOMY FEMORAL Bilateral 03/09/2018   Procedure: ENDARTERECTOMY FEMORAL;  Surgeon: Algernon Huxley, MD;  Location: [facility] ORS;  Service: Vascular;  Laterality: Bilateral;  . ENDARTERECTOMY POPLITEAL Left 03/09/2018   Procedure: ENDARTERECTOMY POPLITEAL AND SFA;  Surgeon: Algernon Huxley, MD;  Location: [facility] ORS;  Service: Vascular;  Laterality: Left;  . ESOPHAGOGASTRODUODENOSCOPY  03/05/2013  . EYE SURGERY    . Eyelid Surgery  2012  . INTRAMEDULLARY (IM) NAIL INTERTROCHANTERIC Left 10/30/2015   Procedure: INTRAMEDULLARY (IM) NAIL INTERTROCHANTRIC ;  Surgeon: Hessie Knows, MD;  Location: [facility] ORS;  Service: Orthopedics;  Laterality: Left;  . KYPHOPLASTY N/A 10/25/2018   Procedure: L4 KYPHOPLASTY;  Surgeon: Hessie Knows, MD;  Location: [facility] ORS;  Service: Orthopedics;  Laterality: N/A;  . LAPAROSCOPIC HYSTERECTOMY  2000   total  . LOWER EXTREMITY ANGIOGRAPHY Left 03/08/2017   Procedure: LOWER EXTREMITY ANGIOGRAPHY;  Surgeon: Algernon Huxley, MD;  Location: [place] [facility];  Service: Cardiovascular;  Laterality: Left;  . LOWER EXTREMITY ANGIOGRAPHY Left 10/30/2017   Procedure: LOWER EXTREMITY ANGIOGRAPHY;  Surgeon: Algernon Huxley, MD;  Location: [place] [facility];  Service: Cardiovascular;  Laterality: Left;  . LOWER EXTREMITY  ANGIOGRAPHY Right 03/08/2018   Procedure: LOWER EXTREMITY ANGIOGRAPHY;  Surgeon: Algernon Huxley, MD;  Location: [place] [facility];  Service: Cardiovascular;  Laterality: Right;  . LOWER EXTREMITY ANGIOGRAPHY Left 10/01/2018   Procedure: LOWER EXTREMITY ANGIOGRAPHY;  Surgeon: Algernon Huxley, MD;  Location: [place] [facility];  Service: Cardiovascular;  Laterality: Left;  . LOWER EXTREMITY ANGIOGRAPHY Right 10/08/2018   Procedure: LOWER EXTREMITY ANGIOGRAPHY;  Surgeon: Algernon Huxley, MD;  Location: [place] [facility];  Service: Cardiovascular;  Laterality: Right;  . PERIPHERAL VASCULAR INTERVENTION  03/08/2018   Procedure: PERIPHERAL VASCULAR INTERVENTION;  Surgeon: Algernon Huxley, MD;  Location: [place] [facility];  Service: Cardiovascular;;  . REDUCTION MAMMAPLASTY  1997  . SACROPLASTY N/A 10/25/2018   Procedure: S1 SACROPLASTY;  Surgeon: Hessie Knows, MD;  Location: [facility] OR[facility];  Service: Orthopedics;  Laterality: N/A;    FAMILY HISTORY Family History  Problem Relation Age of Onset  . Coronary artery disease Father   . Heart attack Father   . Coronary artery disease Mother   . Heart attack Mother   . Ovarian cancer Sister 37       sister had hormonal therapy for IVF txs-which increased risk factor for ovarian cancer  . Breast cancer Neg Hx     SOCIAL HISTORY Social History   Tobacco Use  . Smoking status: Former Smoker    Packs/day: 1.00    Years: 20.00    Pack years: 20.00    Types: Cigarettes    Quit date: 03/07/1996    Years since quitting: 23.0  . Smokeless tobacco: Never Used  . Tobacco comment: started smoking at age 89  Substance Use Topics  . Alcohol use: No    Alcohol/week: 0.0 standard drinks  . Drug use: No         OPHTHALMIC EXAM:  Base Eye Exam    Visual Acuity (Snellen - Linear)      Right Left  Dist [place] 20/50 + 20/20   Dist ph [place] 20/30        Tonometry (Tonopen, 2:45 PM)      Right Left   Pressure 15 16       Pupils      Dark Light Shape React APD    Right 2 1 Round Brisk None   Left 2 1 Round Brisk None       Visual Fields (Counting fingers)      Left Right    Full Full       Extraocular Movement      Right Left    Full, Ortho Full, Ortho       Neuro/Psych    Oriented x3: Yes   Mood/Affect: Normal       Dilation    Both eyes: 1.0% Mydriacyl, 2.5% Phenylephrine @ 2:45 PM        Slit Lamp and Fundus Exam    External Exam      Right Left   External Normal Normal       Slit Lamp Exam      Right Left   Lids/Lashes dermatochalasis dermatochalasis   Conjunctiva/Sclera White and quiet White and quiet   Cornea arcus; well healed cataract wound; 1-2+ Punctate epithelial erosions, decreased TBUT, mild Anterior basement membrane dystrophy superiorly arcus; well healed cataract wound, 1+ Punctate epithelial erosions, irregualr epi surface, decreased TBUT   Anterior Chamber Deep and quiet Deep and quiet   [place]ris Round and dilated Round and dilated   Lens PCIOL; open PC PCIOL; open PC   Vitreous syneresis, Posterior vitreous detachment, vitreous condensations inferiorly syneresis, Posterior vitreous detachment       Fundus Exam      Right Left   Disc Superior hyperemia and mild edema, +IRH superior disc - persistent Pink and Sharp   C/D Ratio 0.4 0.5   Macula Flat, Blunted foveal reflex, interval improvement in central CME/IRF, +Epiretinal membrane flat; blunted foveal reflex, no heme or edema, small pigment clump IT to fovea   Vessels Vascular attenuation, Tortuous Vascular attenuation   Periphery attached; 360 MAs/DBH inferior Attached, no heme          IMAGING AND PROCEDURES  Imaging and Procedures for 04/25/17  OCT, Retina - OU - Both Eyes       Right Eye Quality was good. Central Foveal Thickness: 354. Progression has improved. Findings include abnormal foveal contour, epiretinal membrane, intraretinal fluid, no SRF (Massive improvement in IRF - just trace remaining, +ERM).   Left Eye Quality was good.  Central Foveal Thickness: 284. Progression has been stable. Findings include normal foveal contour, no IRF, no SRF (Trace ERM).   Notes *Images captured and stored on drive  Diagnosis / Impression:  OD: Massive improvement in IRF - just trace remaining; +ERM OS: NFP; no IRF/SRF--stable, trace ERM  Clinical management:  See below  Abbreviations: NFP - Normal foveal profile. CME - cystoid macular edema. PED - pigment epithelial detachment. IRF - intraretinal fluid. SRF - subretinal fluid. EZ - ellipsoid zone. ERM - epiretinal membrane. ORA - outer retinal atrophy. ORT - outer retinal tubulation. SRHM - subretinal hyper-reflective material        Intravitreal Injection, Pharmacologic Agent - OD - Right Eye       Time Out 03/12/2019. 2:32 PM. Confirmed correct patient, procedure, site, and patient consented.   Anesthesia Topical anesthesia was used. Anesthetic medications included Lidocaine 2%, Proparacaine 0.5%.   Procedure Preparation included 5% betadine to ocular surface,  eyelid speculum. A (32 g) needle was used.   Injection:  2 mg aflibercept Alfonse Flavors) SOLN   NDC: A3590391, Lot: [id number], Expiration date: 08/30/2019   Route: Intravitreal, Site: Right Eye, Waste: 0.05 mL  Post-op Post injection exam found visual acuity of at least counting fingers. The patient tolerated the procedure well. There were no complications. The patient received written and verbal post procedure care education.                 ASSESSMENT/PLAN:    ICD-10-CM   1. Branch retinal vein occlusion of right eye with macular edema  H34.8310 Intravitreal Injection, Pharmacologic Agent - OD - Right Eye    aflibercept (EYLEA) SOLN 2 mg  2. Retinal edema  H35.81 OCT, Retina - OU - Both Eyes  3. Both eyes affected by mild nonproliferative diabetic retinopathy with macular edema, associated with type 2 diabetes mellitus ([place])  C16.6063   4. Essential hypertension  I10   5. Hypertensive retinopathy  of both eyes  H35.033   6. Epiretinal membrane (ERM) of right eye  H35.371   7. Pseudophakia of both eyes  Z96.1   8. 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 BRVO w/ CME OD  - by history, pt states symptoms first noticed 2 wks prior to presentation, but reports changes may have occurred prior  - initial exam with differential tortuosity of vessels (OD > OS)  - FA (02.10.20) shows mild late staining / leakage in macula, staining / leakage of disc -- improving CME  - differential includes DM2 (DME), hypertensive retinopathy, inflammatory etiology / uveitis  - S/P IVA OD #1 (02.08.19), #2 (03.11.19), #3 (04.09.19), #4 (05.20.19), #5 (02.10.20)  - gave IVA OD on 2.10.20 due to pending Eylea4U for 2020 -- resulted in increased IRF/CME  - review of OCTs show persistent IRF and cystic changes --  resistance to IVA   - June 2019 -- switched therapies: S/P IVE OD #1 (06.24.19), #2 (07.24.19), #3 (09.04.19), #4 (10.30.19),#5 (12.30.19), #6 (03.23.20), #7 (05.05.20), #8 (07.16.20), #9 (07.17.20), #10 (08.28.20), #11 (10.13.20), # 12 (11.17.20), #13 (2.8.21)  - OCT today shows massive improvement in IRF and CME  - BCVA improved to 20/30 from 20/80+   - Eylea4U benefits investigation for 2021 now completed and pt approved for IVE  - recommend IVE OD #14 today, 03.09.21  - RBA of procedure discussed, questions answered  - informed consent obtained  - see procedure note  - Eylea informed consent form obtained and scanned on 11.19.2020  - F/U 5 weeks  -- DFE/OCT/possible injection  3. Mild nonproliferative diabetic retinopathy, both eyes  - The incidence, risk factors for progression, natural history and treatment options for diabetic retinopathy were discussed with patient.    - The need for close monitoring of blood glucose, blood pressure, and serum lipids, avoiding cigarette or any type of tobacco, and the need for long term follow up was also discussed with patient.  - could be  contributing to CME OD  - OS with minimal diabetic retinopathy  - continue to monitor  4,5. Hypertensive retinopathy OU - stable - as above, may have contributing to CME OD - discussed importance of tight BP control - monitor  6. Epiretinal membrane, right eye   - stable nasal ERM  - no indication for surgery at this time  7. Pseudophakia OU  - s/p CE[facility]IOL OU by [facility]  - doing well  - monitor  8. Optic disc edema OD -- sectoral  - likely secondary to BRVO but differential includes carotid stenosis and retro-orbital mass  - history of blood clots  - recommend CT orbits w/ contrast to r/o retro-orbital mass -- not obtained  - recommend carotid dopplers to r/o stenosis / occlusion -- pt scheduled for repeat u/s due to recent bilateral femoral endarterectomies -- strong history of peripheral vascular disease   Ophthalmic Meds Ordered this visit:  Meds ordered this encounter  Medications  . aflibercept (EYLEA) SOLN 2 mg       Return in about 5 weeks (around 04/16/2019) for f/u BRVO OD, DFE, OCT.  There are no Patient Instructions on file for this visit.   Explained the diagnoses, plan, and follow up with the patient and they expressed understanding.  Patient expressed understanding of the importance of proper follow up care.   This document serves as a record of services personally performed by Gardiner Sleeper, MD, PhD. It was created on their behalf by Estill Bakes, COT an ophthalmic technician. The creation of this record is the provider's dictation and/or activities during the visit.    Electronically signed by: Estill Bakes, COT 03/11/19 @ 3:56 PM   This document serves as a record of services personally performed by Gardiner Sleeper, MD, PhD. It was created on their behalf by Ernest Mallick, OA, an ophthalmic assistant. The creation of this record is the provider's dictation and/or activities during the visit.    Electronically signed by: Ernest Mallick, OA  03.09.2021 3:56 PM  Gardiner Sleeper, M.D., Ph.D. Diseases & Surgery of the Retina and [place] 03/12/2019   I have reviewed the above documentation for accuracy and completeness, and I agree with the above. Gardiner Sleeper, M.D., Ph.D. 03/12/19 3:56 PM   Abbreviations: M myopia (nearsighted); A astigmatism; H hyperopia (farsighted); P presbyopia; Mrx spectacle prescription;  CTL contact lenses; OD right eye; OS left eye; OU both eyes  XT exotropia; ET esotropia; PEK punctate epithelial keratitis; PEE punctate epithelial erosions; DES dry eye syndrome; MGD meibomian gland dysfunction; ATs artificial tears; PFAT's preservative free artificial tears; [place] nuclear sclerotic cataract; PSC posterior subcapsular cataract; ERM epi-retinal membrane; PVD posterior vitreous detachment; RD retinal detachment; DM diabetes mellitus; DR diabetic retinopathy; NPDR non-proliferative diabetic retinopathy; PDR proliferative diabetic retinopathy; CSME clinically significant macular edema; DME diabetic macular edema; dbh dot blot hemorrhages; CWS cotton wool spot; POAG primary open angle glaucoma; C/D cup-to-disc ratio; HVF humphrey visual field; GVF goldmann visual field; OCT optical coherence tomography; IOP intraocular pressure; BRVO Branch retinal vein occlusion; CRVO central retinal vein occlusion; CRAO central retinal artery occlusion; BRAO branch retinal artery occlusion; RT retinal tear; SB scleral buckle; PPV pars plana vitrectomy; VH Vitreous hemorrhage; PRP panretinal laser photocoagulation; IVK intravitreal kenalog; VMT vitreomacular traction; MH Macular hole;  NVD neovascularization of the disc; NVE neovascularization elsewhere; AREDS age related eye disease study; ARMD age related macular degeneration; POAG primary open angle glaucoma; EBMD epithelial/anterior basement membrane dystrophy; ACIOL anterior chamber intraocular lens; IOL intraocular lens; PCIOL posterior chamber  intraocular lens; Phaco/IOL phacoemulsification with intraocular lens placement; [place] photorefractive keratectomy; LASIK laser assisted in situ keratomileusis; HTN hypertension; DM diabetes mellitus; COPD chronic obstructive pulmonary disease

## 2019-03-12 ENCOUNTER — Encounter (INDEPENDENT_AMBULATORY_CARE_PROVIDER_SITE_OTHER): Payer: Self-pay | Admitting: Ophthalmology

## 2019-03-12 ENCOUNTER — Ambulatory Visit (INDEPENDENT_AMBULATORY_CARE_PROVIDER_SITE_OTHER): Payer: Medicare Other | Admitting: Ophthalmology

## 2019-03-12 DIAGNOSIS — I10 Essential (primary) hypertension: Secondary | ICD-10-CM | POA: Diagnosis not present

## 2019-03-12 DIAGNOSIS — H3581 Retinal edema: Secondary | ICD-10-CM | POA: Diagnosis not present

## 2019-03-12 DIAGNOSIS — E113213 Type 2 diabetes mellitus with mild nonproliferative diabetic retinopathy with macular edema, bilateral: Secondary | ICD-10-CM | POA: Diagnosis not present

## 2019-03-12 DIAGNOSIS — H35033 Hypertensive retinopathy, bilateral: Secondary | ICD-10-CM

## 2019-03-12 DIAGNOSIS — Z961 Presence of intraocular lens: Secondary | ICD-10-CM

## 2019-03-12 DIAGNOSIS — H4710 Unspecified papilledema: Secondary | ICD-10-CM

## 2019-03-12 DIAGNOSIS — H34831 Tributary (branch) retinal vein occlusion, right eye, with macular edema: Secondary | ICD-10-CM

## 2019-03-12 DIAGNOSIS — H35371 Puckering of macula, right eye: Secondary | ICD-10-CM

## 2019-03-12 MED ORDER — AFLIBERCEPT 2MG/0.05ML IZ SOLN FOR KALEIDOSCOPE
2.0000 mg | INTRAVITREAL | Status: AC | PRN
  Administered 2019-03-12: 2 mg via INTRAVITREAL

## 2019-03-14 ENCOUNTER — Other Ambulatory Visit
Admission: RE | Admit: 2019-03-14 | Discharge: 2019-03-14 | Disposition: A | Discharge status: Home / Self Care | Payer: Medicare Other | Source: Ambulatory Visit | Attending: Internal Medicine | Admitting: Internal Medicine

## 2019-03-14 NOTE — OR Nursing (Signed)
Patient does not need to be covid tested prior to endo procedure on 03/18/19 as she was positive on December 25, 2018. (results found in care everywhere)

## 2019-03-15 ENCOUNTER — Encounter: Payer: Self-pay | Admitting: Internal Medicine

## 2019-03-18 ENCOUNTER — Ambulatory Visit
Admission: RE | Admit: 2019-03-18 | Discharge: 2019-03-18 | Disposition: A | Discharge status: Home / Self Care | Payer: Medicare Other | Attending: Internal Medicine | Admitting: Internal Medicine

## 2019-03-18 ENCOUNTER — Other Ambulatory Visit: Payer: Self-pay

## 2019-03-18 ENCOUNTER — Ambulatory Visit: Payer: Medicare Other | Admitting: Anesthesiology

## 2019-03-18 ENCOUNTER — Encounter: Payer: Self-pay | Admitting: Internal Medicine

## 2019-03-18 ENCOUNTER — Encounter
Admission: RE | Disposition: A | Discharge status: Home / Self Care | Payer: Self-pay | Source: Home / Self Care | Attending: Internal Medicine

## 2019-03-18 DIAGNOSIS — D500 Iron deficiency anemia secondary to blood loss (chronic): Secondary | ICD-10-CM | POA: Diagnosis present

## 2019-03-18 DIAGNOSIS — E039 Hypothyroidism, unspecified: Secondary | ICD-10-CM | POA: Diagnosis not present

## 2019-03-18 DIAGNOSIS — K219 Gastro-esophageal reflux disease without esophagitis: Secondary | ICD-10-CM | POA: Insufficient documentation

## 2019-03-18 DIAGNOSIS — I129 Hypertensive chronic kidney disease with stage 1 through stage 4 chronic kidney disease, or unspecified chronic kidney disease: Secondary | ICD-10-CM | POA: Insufficient documentation

## 2019-03-18 DIAGNOSIS — R519 Headache, unspecified: Secondary | ICD-10-CM | POA: Diagnosis not present

## 2019-03-18 DIAGNOSIS — Z79899 Other long term (current) drug therapy: Secondary | ICD-10-CM | POA: Insufficient documentation

## 2019-03-18 DIAGNOSIS — E113293 Type 2 diabetes mellitus with mild nonproliferative diabetic retinopathy without macular edema, bilateral: Secondary | ICD-10-CM | POA: Insufficient documentation

## 2019-03-18 DIAGNOSIS — Z7982 Long term (current) use of aspirin: Secondary | ICD-10-CM | POA: Diagnosis not present

## 2019-03-18 DIAGNOSIS — K2970 Gastritis, unspecified, without bleeding: Secondary | ICD-10-CM | POA: Diagnosis not present

## 2019-03-18 DIAGNOSIS — M109 Gout, unspecified: Secondary | ICD-10-CM | POA: Diagnosis not present

## 2019-03-18 DIAGNOSIS — K449 Diaphragmatic hernia without obstruction or gangrene: Secondary | ICD-10-CM | POA: Insufficient documentation

## 2019-03-18 DIAGNOSIS — Z7901 Long term (current) use of anticoagulants: Secondary | ICD-10-CM | POA: Diagnosis not present

## 2019-03-18 DIAGNOSIS — F419 Anxiety disorder, unspecified: Secondary | ICD-10-CM | POA: Diagnosis not present

## 2019-03-18 DIAGNOSIS — E1151 Type 2 diabetes mellitus with diabetic peripheral angiopathy without gangrene: Secondary | ICD-10-CM | POA: Insufficient documentation

## 2019-03-18 DIAGNOSIS — K640 First degree hemorrhoids: Secondary | ICD-10-CM | POA: Diagnosis not present

## 2019-03-18 DIAGNOSIS — Z87891 Personal history of nicotine dependence: Secondary | ICD-10-CM | POA: Diagnosis not present

## 2019-03-18 DIAGNOSIS — Z86010 Personal history of colonic polyps: Secondary | ICD-10-CM | POA: Insufficient documentation

## 2019-03-18 DIAGNOSIS — N183 Chronic kidney disease, stage 3 unspecified: Secondary | ICD-10-CM | POA: Diagnosis not present

## 2019-03-18 DIAGNOSIS — E785 Hyperlipidemia, unspecified: Secondary | ICD-10-CM | POA: Diagnosis not present

## 2019-03-18 DIAGNOSIS — G4730 Sleep apnea, unspecified: Secondary | ICD-10-CM | POA: Insufficient documentation

## 2019-03-18 DIAGNOSIS — E1122 Type 2 diabetes mellitus with diabetic chronic kidney disease: Secondary | ICD-10-CM | POA: Diagnosis not present

## 2019-03-18 DIAGNOSIS — Z794 Long term (current) use of insulin: Secondary | ICD-10-CM | POA: Insufficient documentation

## 2019-03-18 DIAGNOSIS — M1990 Unspecified osteoarthritis, unspecified site: Secondary | ICD-10-CM | POA: Diagnosis not present

## 2019-03-18 HISTORY — PX: ESOPHAGOGASTRODUODENOSCOPY (EGD) WITH PROPOFOL: SHX5813

## 2019-03-18 HISTORY — PX: COLONOSCOPY WITH PROPOFOL: SHX5780

## 2019-03-18 LAB — GLUCOSE, CAPILLARY: Glucose-Capillary: 172 mg/dL — ABNORMAL HIGH (ref 70–99)

## 2019-03-18 SURGERY — ESOPHAGOGASTRODUODENOSCOPY (EGD) WITH PROPOFOL
Anesthesia: General

## 2019-03-18 MED ORDER — PROPOFOL 10 MG/ML IV BOLUS
INTRAVENOUS | Status: AC
  Filled 2019-03-18: qty 20

## 2019-03-18 MED ORDER — SODIUM CHLORIDE 0.9 % IV SOLN: INTRAVENOUS | Status: DC

## 2019-03-18 MED ORDER — LACTATED RINGERS IV SOLN: INTRAVENOUS | Status: DC | PRN

## 2019-03-18 MED ORDER — LIDOCAINE HCL (CARDIAC) PF 100 MG/5ML IV SOSY
PREFILLED_SYRINGE | INTRAVENOUS | Status: DC | PRN
  Administered 2019-03-18: 40 mg via INTRAVENOUS

## 2019-03-18 MED ORDER — PROPOFOL 500 MG/50ML IV EMUL
INTRAVENOUS | Status: DC | PRN
  Administered 2019-03-18: 30 mg via INTRAVENOUS
  Administered 2019-03-18: 125 ug/kg/min via INTRAVENOUS
  Administered 2019-03-18 (×2): 20 mg via INTRAVENOUS

## 2019-03-18 NOTE — Op Note (Signed)
[facility] Gastroenterology Patient Name: Cheryl Leonard Procedure Date: 03/18/2019 8:30 AM MRN: [id number] Account #: [id number] Date of Birth: 1945-09-02 Admit Type: Outpatient Age: 74 Room: [facility] ENDO ROOM 3 Gender: Female Note Status: Finalized Procedure:             Upper GI endoscopy Indications:           Iron deficiency anemia secondary to chronic blood loss Providers:             [name]. [name] MD, MD Referring MD:          Danella Penton, MD (Referring MD) Medicines:             Propofol per Anesthesia Complications:         No immediate complications. Procedure:             Pre-Anesthesia Assessment:                        - The risks and benefits of the procedure and the                         sedation options and risks were discussed with the                         patient. All questions were answered and informed                         consent was obtained.                        - Patient identification and proposed procedure were                         verified prior to the procedure by the nurse. The                         procedure was verified in the procedure room.                        - ASA Grade Assessment: III - A patient with severe                         systemic disease.                        - After reviewing the risks and benefits, the patient                         was deemed in satisfactory condition to undergo the                         procedure.                        After obtaining informed consent, the endoscope was                         passed under direct vision. Throughout the procedure,  the patient's blood pressure, pulse, and oxygen                         saturations were monitored continuously. The Endoscope                         was introduced through the mouth, and advanced to the                         third part of duodenum. The upper GI endoscopy was     accomplished without difficulty. The patient tolerated                         the procedure well. Findings:      The examined esophagus was normal.      Patchy mild inflammation characterized by erythema was found in the       gastric antrum.      A 1 cm hiatal hernia was present.      The examined duodenum was normal. Impression:            - Normal esophagus.                        - Gastritis.                        - 1 cm hiatal hernia.                        - Normal examined duodenum.                        - No specimens collected. Recommendation:        - Proceed with colonoscopy Procedure Code(s):     --- Professional ---                        [id number], Esophagogastroduodenoscopy, flexible,                         transoral; diagnostic, including collection of                         specimen(s) by brushing or washing, when performed                         (separate procedure) Diagnosis Code(s):     --- Professional ---                        D50.0, Iron deficiency anemia secondary to blood loss                         (chronic)                        K44.9, Diaphragmatic hernia without obstruction or                         gangrene                        K29.70, Gastritis, unspecified, without bleeding CPT copyright  2019 American Medical Association. All rights reserved. The codes documented in this report are preliminary and upon coder review may  be revised to meet current compliance requirements. Efrain Sella MD, MD 03/18/2019 8:44:31 AM This report has been signed electronically. Number of Addenda: 0 Note Initiated On: 03/18/2019 8:30 AM Estimated Blood Loss:  Estimated blood loss: none.      [facility]

## 2019-03-18 NOTE — Transfer of Care (Signed)
Immediate Anesthesia Transfer of Care Note  Patient: Cheryl Leonard  Procedure(s) Performed: ESOPHAGOGASTRODUODENOSCOPY (EGD) WITH PROPOFOL (N/A ) COLONOSCOPY WITH PROPOFOL (N/A )  Patient Location: PACU  Anesthesia Type:MAC  Level of Consciousness: drowsy  Airway & Oxygen Therapy: Patient Spontanous Breathing  Post-op Assessment: Report given to RN  Post vital signs: stable  Last Vitals:  Vitals Value Taken Time  BP 95/39 03/18/19 0900  Temp 35.8 C 03/18/19 0900  Pulse 85 03/18/19 0902  Resp 12 03/18/19 0902  SpO2 96 % 03/18/19 0902  Vitals shown include unvalidated device data.  Last Pain:  Vitals:   03/18/19 0713  TempSrc: Temporal  PainSc: 0-No pain         Complications: No apparent anesthesia complications

## 2019-03-18 NOTE — Interval H&P Note (Signed)
History and Physical Interval Note:  03/18/2019 8:31 AM  Cheryl Leonard  has presented today for surgery, with the diagnosis of IDA.  The various methods of treatment have been discussed with the patient and family. After consideration of risks, benefits and other options for treatment, the patient has consented to  Procedure(s): ESOPHAGOGASTRODUODENOSCOPY (EGD) WITH PROPOFOL (N/A) COLONOSCOPY WITH PROPOFOL (N/A) as a surgical intervention.  The patient's history has been reviewed, patient examined, no change in status, stable for surgery.  I have reviewed the patient's chart and labs.  Questions were answered to the patient's satisfaction.     [place], [place]

## 2019-03-18 NOTE — Anesthesia Preprocedure Evaluation (Signed)
Anesthesia Evaluation  Patient identified by MRN, date of birth, ID band Patient awake    Reviewed: Allergy & Precautions, NPO status , Patient's Chart, lab work & pertinent test results  History of Anesthesia Complications Negative for: history of anesthetic complications  Airway Mallampati: II  TM Distance: >3 FB Neck ROM: Full    Dental  (+) Upper Dentures, Lower Dentures   Pulmonary sleep apnea , neg COPD, former smoker,    breath sounds clear to auscultation- rhonchi (-) wheezing      Cardiovascular hypertension, Pt. on medications + Peripheral Vascular Disease  (-) CAD, (-) Past MI, (-) Cardiac Stents and (-) CABG  Rhythm:Regular Rate:Normal - Systolic murmurs and - Diastolic murmurs    Neuro/Psych  Headaches, neg Seizures Anxiety    GI/Hepatic Neg liver ROS, GERD  ,  Endo/Other  diabetes, Insulin DependentHypothyroidism   Renal/GU Renal InsufficiencyRenal disease     Musculoskeletal  (+) Arthritis ,   Abdominal (+) - obese,   Peds  Hematology  (+) anemia ,   Anesthesia Other Findings Past Medical History: No date: Anemia No date: Anxiety No date: Arthritis     Comment:  Gout No date: Cataracts, both eyes No date: Diabetic retinopathy (HCC)     Comment:  NPDR OU No date: Diabetic retinopathy (HCC) No date: GERD (gastroesophageal reflux disease) No date: Gout No date: Headache     Comment:  h/o migraines No date: History of fracture of patella     Comment:  right knee No date: History of positive PPD     Comment:  Patient always shows positive No date: Hyperlipidemia No date: Hypertension No date: Hypertensive retinopathy     Comment:  OU No date: Hypothyroidism 12/30/2013: Lichen sclerosus     Comment:  of vulva No date: Metatarsal fracture No date: Neuropathy No date: Peripheral vascular disease (HCC) No date: Polyneuropathy     Comment:  numbness and tingling in feet and toes No date:  Renal insufficiency     Comment:  Stage 3 No date: Sleep apnea     Comment:  does not use cpap-lost weight  No date: Type 2 diabetes mellitus, uncontrolled (HCC)   Reproductive/Obstetrics                             Anesthesia Physical Anesthesia Plan  ASA: III  Anesthesia Plan: General   Post-op Pain Management:    Induction: Intravenous  PONV Risk Score and Plan: 2 and Propofol infusion  Airway Management Planned: Natural Airway  Additional Equipment:   Intra-op Plan:   Post-operative Plan:   Informed Consent: I have reviewed the patients History and Physical, chart, labs and discussed the procedure including the risks, benefits and alternatives for the proposed anesthesia with the patient or authorized representative who has indicated his/her understanding and acceptance.     Dental advisory given  Plan Discussed with: CRNA and Anesthesiologist  Anesthesia Plan Comments:         Anesthesia Quick Evaluation

## 2019-03-18 NOTE — H&P (Signed)
Outpatient short stay form Pre-procedure 03/18/2019 8:30 AM Cheryl Leonard K. Cheryl Leonard, M.D.  Primary Physician: [name], M.D.  Reason for visit:  Iron deficiency anemia, constipation  History of present illness:  Patient with iron deficiency anemia. Besides mild constipation, Patient denies change in bowel habits, rectal bleeding, weight loss or abdominal pain.      Current Facility-Administered Medications:  .  0.9 %  sodium chloride infusion, , Intravenous, Continuous, [name], [name], MD  Facility-Administered Medications Ordered in Other Encounters:  .  lactated ringers infusion, , Intravenous, Continuous PRN, [name], CRNA, New Bag at 03/18/19 0756  Facility-Administered Medications Prior to Admission  Medication Dose Route Frequency Provider Last Rate Last Admin  . aflibercept (EYLEA) SOLN 2 mg  2 mg Intravitreal  [name], MD   2 mg at 06/27/17 1307  . aflibercept (EYLEA) SOLN 2 mg  2 mg Intravitreal  [name], MD   2 mg at 07/26/17 1702  . aflibercept (EYLEA) SOLN 2 mg  2 mg Intravitreal  [name], MD   2 mg at 09/06/17 1700  . aflibercept (EYLEA) SOLN 2 mg  2 mg Intravitreal  [name], MD   2 mg at 11/01/17 1550  . aflibercept (EYLEA) SOLN 2 mg  2 mg Intravitreal  [name], MD   2 mg at 01/03/18 1416  . Bevacizumab (AVASTIN) SOLN 1.25 mg  1.25 mg Intravitreal  [name], MD   1.25 mg at 02/10/17 0953  . Bevacizumab (AVASTIN) SOLN 1.25 mg  1.25 mg Intravitreal  [name], MD   1.25 mg at 03/13/17 1717  . Bevacizumab (AVASTIN) SOLN 1.25 mg  1.25 mg Intravitreal  [name], MD   1.25 mg at 04/11/17 1626  . Bevacizumab (AVASTIN) SOLN 1.25 mg  1.25 mg Intravitreal  [name], MD   1.25 mg at 02/12/18 2154  . insulin aspart (novoLOG) injection 0-15 Units  0-15 Units Subcutaneous TID WC [name], [name], PA-C      . insulin aspart (novoLOG) injection 0-5 Units  0-5 Units Subcutaneous QHS [name], [name], PA-C       Medications  Prior to Admission  Medication Sig Dispense Refill Last Dose  . amLODipine (NORVASC) 5 MG tablet Take by mouth.   03/17/2019 at Unknown time  . aspirin EC 81 MG tablet Take 81 mg by mouth daily.    03/17/2019 at Unknown time  . furosemide (LASIX) 20 MG tablet Take 20 mg by mouth daily as needed for fluid or edema.    Past Week at Unknown time  . lisinopril (PRINIVIL,ZESTRIL) 10 MG tablet Take 10 mg by mouth every morning.    03/17/2019 at Unknown time  . metoprolol succinate (TOPROL-XL) 50 MG 24 hr tablet Take by mouth.   03/17/2019 at Unknown time  . acetaminophen (TYLENOL) 500 MG tablet Take 1,000 mg by mouth every 6 (six) hours as needed for moderate pain or headache.     . Aflibercept (EYLEA) 2 MG/0.05ML SOLN 2 mg by Intravitreal route every 5 (five) weeks.      . ALPRAZolam (XANAX) 0.25 MG tablet Take 0.25 mg by mouth daily as needed for anxiety or sleep.      . Ascorbic Acid (VITAMIN C) 1000 MG tablet Take 1,000 mg by mouth daily.     [name] buPROPion (WELLBUTRIN XL) 150 MG 24 hr tablet Take 150 mg by mouth daily.      [name] buPROPion (WELLBUTRIN XL) 300 MG 24 hr tablet Take 300 mg by mouth daily.     [name]  calcium carbonate (TUMS EX) 750 MG chewable tablet Chew 2,250 mg by mouth daily as needed for heartburn.     . chlorhexidine (PERIDEX) 0.12 % solution Use as directed 15 mLs in the mouth or throat 2 (two) times daily.   0   . cholecalciferol (VITAMIN D) 1000 units tablet Take 1,000 Units by mouth 2 (two) times daily.     [name] CORAL CALCIUM PO Take 1 tablet by mouth 2 (two) times daily.      [name] denosumab (PROLIA) 60 MG/ML SOLN injection Inject 60 mg into the skin every 6 (six) months.      . DENTA 5000 PLUS 1.1 % CREA dental cream Place 1 application onto teeth 2 (two) times daily. as directed  99   . DULoxetine (CYMBALTA) 60 MG capsule Take 1 capsule (60 mg total) by mouth 2 (two) times daily.  3   . ELIQUIS 5 MG TABS tablet TAKE 1 TABLET BY MOUTH TWICE A DAY 60 tablet 2 03/16/2019  . famotidine (PEPCID) 40  MG tablet Take 40 mg by mouth 2 (two) times daily.      . ferrous gluconate (FERGON) 324 MG tablet Take 324 mg by mouth daily with breakfast.     . gabapentin (NEURONTIN) 100 MG capsule Take 1 capsule by mouth daily.     . Insulin Pen Needle (B-D UF III MINI PEN NEEDLES) 31G X 5 MM MISC once daily.     [name] levothyroxine (SYNTHROID, LEVOTHROID) 100 MCG tablet Take 100 mcg by mouth daily before breakfast.   3   . Magnesium 500 MG TABS Take 500 mg by mouth every morning.      . mirtazapine (REMERON) 15 MG tablet Take by mouth.     . Multiple Vitamin (MULTIVITAMIN WITH MINERALS) TABS tablet Take 1 tablet by mouth daily.   03/16/2019  . pantoprazole (PROTONIX) 40 MG tablet Take 40 mg by mouth every morning.      . rosuvastatin (CRESTOR) 20 MG tablet Take 20 mg by mouth every morning.      . traMADol (ULTRAM) 50 MG tablet Take 1 tablet (50 mg total) by mouth every 6 (six) hours as needed. 20 tablet 0   . TRESIBA FLEXTOUCH 200 UNIT/ML SOPN Inject 30 Units as directed at bedtime.   5   . TYMLOS 3120 MCG/1.56ML SOPN      . VASCEPA 1 g CAPS Take 1 capsule by mouth 2 (two) times daily.     . vitamin B-12 (CYANOCOBALAMIN) 1000 MCG tablet Take 1,000 mcg by mouth daily.   03/09/2019  . vitamin E 400 UNIT capsule Take 400 Units by mouth daily.     [name] zolpidem (AMBIEN) 5 MG tablet Take 1 tablet (5 mg total) by mouth at bedtime. (Patient taking differently: Take 5 mg by mouth at bedtime as needed. ) 30 tablet 0      No Known Allergies   Past Medical History:  Diagnosis Date  . Anemia   . Anxiety   . Arthritis    Gout  . Cataracts, both eyes   . Diabetic retinopathy (HCC)    NPDR OU  . Diabetic retinopathy (HCC)   . GERD (gastroesophageal reflux disease)   . Gout   . Headache    h/o migraines  . History of fracture of patella    right knee  . History of positive PPD    Patient always shows positive  . Hyperlipidemia   . Hypertension   . Hypertensive retinopathy  OU  . Hypothyroidism   .  Lichen sclerosus 11/91/4782   of vulva  . Metatarsal fracture   . Neuropathy   . Peripheral vascular disease ([place])   . Polyneuropathy    numbness and tingling in feet and toes  . Renal insufficiency    Stage 3  . Sleep apnea    does not use cpap-lost weight   . Type 2 diabetes mellitus, uncontrolled ([place])     Review of systems:  Otherwise negative.    Physical Exam  Gen: Alert, oriented. Appears stated age.  HEENT: [place]/AT. PERRLA. Lungs: CTA, no wheezes. CV: RR nl S1, S2. Abd: soft, benign, no masses. BS+ Ext: No edema. Pulses 2+    Planned procedures: Proceed with EGD and colonoscopy. The patient understands the nature of the planned procedure, indications, risks, alternatives and potential complications including but not limited to bleeding, infection, perforation, damage to internal organs and possible oversedation/side effects from anesthesia. The patient agrees and gives consent to proceed.  Please refer to procedure notes for findings, recommendations and patient disposition/instructions.     Welby Montminy K. Alice Reichert, M.D. Gastroenterology 03/18/2019  8:30 AM

## 2019-03-18 NOTE — Op Note (Addendum)
[facility] Gastroenterology Patient Name: Cheryl Leonard Procedure Date: 03/18/2019 8:29 AM MRN: [id number] Account #: [id number] Date of Birth: March 01, 1945 Admit Type: Outpatient Age: 74 Room: [facility] ENDO ROOM 3 Gender: Female Note Status: Supervisor Override Procedure:             Colonoscopy Indications:           Iron deficiency anemia secondary to chronic blood                         loss, Personal history of colonic polyps Providers:             [name]. [name] MD, MD Referring MD:          Danella Penton, MD (Referring MD) Medicines:             Propofol per Anesthesia Complications:         No immediate complications. Procedure:             Pre-Anesthesia Assessment:                        - The risks and benefits of the procedure and the                         sedation options and risks were discussed with the                         patient. All questions were answered and informed                         consent was obtained.                        - Patient identification and proposed procedure were                         verified prior to the procedure by the nurse. The                         procedure was verified in the procedure room.                        - ASA Grade Assessment: III - A patient with severe                         systemic disease.                        - After reviewing the risks and benefits, the patient                         was deemed in satisfactory condition to undergo the                         procedure.                        After obtaining informed consent, the colonoscope was                         passed under  direct vision. Throughout the procedure,                         the patient's blood pressure, pulse, and oxygen                         saturations were monitored continuously. The                         Colonoscope was introduced through the anus and                         advanced to the the  cecum, identified by appendiceal                         orifice and ileocecal valve. The colonoscopy was                         performed without difficulty. The patient tolerated                         the procedure well. The quality of the bowel                         preparation was good. The ileocecal valve, appendiceal                         orifice, and rectum were photographed. Findings:      The perianal and digital rectal examinations were normal. Pertinent       negatives include normal sphincter tone and no palpable rectal lesions.      Non-bleeding internal hemorrhoids were found during retroflexion. The       hemorrhoids were Grade I (internal hemorrhoids that do not prolapse).      The exam was otherwise without abnormality.      A few small-mouthed diverticula were found in the sigmoid colon. Impression:            - The entire examined colon is normal.                        - Non-bleeding internal hemorrhoids.                        - The examination was otherwise normal.                        - No specimens collected. Recommendation:        - Patient has a contact number available for                         emergencies. The signs and symptoms of potential                         delayed complications were discussed with the patient.                         Return to normal activities tomorrow. Written  discharge instructions were provided to the patient.                        - Resume previous diet.                        - Continue present medications.                        - No repeat colonoscopy due to current age (77 years                         or older), the absence of advanced adenomas and the                         absence of colonic polyps.                        - Return to physician assistant in 3 months.                        - To visualize the small bowel, perform video capsule                         endoscopy at  appointment to be scheduled.                        - The findings and recommendations were discussed with                         the patient. Procedure Code(s):     --- Professional ---                        [id number], Colonoscopy, flexible; diagnostic, including                         collection of specimen(s) by brushing or washing, when                         performed (separate procedure) Diagnosis Code(s):     --- Professional ---                        D50.0, Iron deficiency anemia secondary to blood loss                         (chronic)                        K64.0, First degree hemorrhoids CPT copyright 2019 American Medical Association. All rights reserved. The codes documented in this report are preliminary and upon coder review may  be revised to meet current compliance requirements. Stanton Kidney MD, MD 03/18/2019 9:00:01 AM This report has been signed electronically. Number of Addenda: 0 Note Initiated On: 03/18/2019 8:29 AM Scope Withdrawal Time: 0 hours 6 minutes 38 seconds  Total Procedure Duration: 0 hours 10 minutes 53 seconds  Estimated Blood Loss:  Estimated blood loss: none. Estimated blood loss: none.      [facility]

## 2019-03-18 NOTE — Anesthesia Postprocedure Evaluation (Signed)
Anesthesia Post Note  Patient: Cheryl Leonard  Procedure(s) Performed: ESOPHAGOGASTRODUODENOSCOPY (EGD) WITH PROPOFOL (N/A ) COLONOSCOPY WITH PROPOFOL (N/A )  Patient location during evaluation: Endoscopy Anesthesia Type: General Level of consciousness: awake and alert and oriented Pain management: pain level controlled Vital Signs Assessment: post-procedure vital signs reviewed and stable Respiratory status: spontaneous breathing, nonlabored ventilation and respiratory function stable Cardiovascular status: blood pressure returned to baseline and stable Postop Assessment: no signs of nausea or vomiting Anesthetic complications: no     Last Vitals:  Vitals:   03/18/19 0920 03/18/19 0930  BP: (!) 131/53 (!) 136/52  Pulse: 84 84  Resp: 13 20  Temp:    SpO2: 98% 99%    Last Pain:  Vitals:   03/18/19 0713  TempSrc: Temporal  PainSc: 0-No pain                 Eddy Termine

## 2019-03-19 ENCOUNTER — Encounter: Payer: Self-pay | Admitting: *Deleted

## 2019-04-16 ENCOUNTER — Encounter (INDEPENDENT_AMBULATORY_CARE_PROVIDER_SITE_OTHER): Payer: Self-pay | Admitting: Ophthalmology

## 2019-04-16 ENCOUNTER — Ambulatory Visit (INDEPENDENT_AMBULATORY_CARE_PROVIDER_SITE_OTHER): Payer: Medicare Other | Admitting: Ophthalmology

## 2019-04-16 ENCOUNTER — Other Ambulatory Visit: Payer: Self-pay

## 2019-04-16 DIAGNOSIS — I10 Essential (primary) hypertension: Secondary | ICD-10-CM

## 2019-04-16 DIAGNOSIS — H3581 Retinal edema: Secondary | ICD-10-CM

## 2019-04-16 DIAGNOSIS — H35371 Puckering of macula, right eye: Secondary | ICD-10-CM

## 2019-04-16 DIAGNOSIS — H34831 Tributary (branch) retinal vein occlusion, right eye, with macular edema: Secondary | ICD-10-CM | POA: Diagnosis not present

## 2019-04-16 DIAGNOSIS — H35033 Hypertensive retinopathy, bilateral: Secondary | ICD-10-CM

## 2019-04-16 DIAGNOSIS — H4710 Unspecified papilledema: Secondary | ICD-10-CM

## 2019-04-16 DIAGNOSIS — E113213 Type 2 diabetes mellitus with mild nonproliferative diabetic retinopathy with macular edema, bilateral: Secondary | ICD-10-CM | POA: Diagnosis not present

## 2019-04-16 DIAGNOSIS — Z961 Presence of intraocular lens: Secondary | ICD-10-CM

## 2019-04-16 MED ORDER — AFLIBERCEPT 2MG/0.05ML IZ SOLN FOR KALEIDOSCOPE
2.0000 mg | INTRAVITREAL | Status: AC | PRN
  Administered 2019-04-16: 2 mg via INTRAVITREAL

## 2019-04-16 NOTE — Progress Notes (Signed)
[facility] - Clinic Note  04/16/2019     CHIEF COMPLAINT Patient presents for Retina Follow Up   HISTORY OF PRESENT ILLNESS: Cheryl Leonard is a 74 y.o. female who presents to the clinic today for:   HPI    Retina Follow Up    Patient presents with  CRVO/BRVO.  In right eye.  This started 5 weeks ago.  Severity is moderate.  I, the attending physician,  performed the HPI with the patient and updated documentation appropriately.          Comments    Patient here for 5 weeks retina follow up for BRVO OD. Patient states vision seems ok. Same as usual. No eye pain.        Last edited by Rennis ChrisZamora, Brisia Schuermann, MD on 04/16/2019  2:59 PM. (History)    Patient reports vision is better, she states the dilation drops have made her vision extra blurry today  Referring physician: Danella PentonMiller, Mark F, MD [address] [facility] [place] [id number]  HISTORICAL INFORMATION:   Selected notes from the MEDICAL RECORD NUMBER Referred by Dr. Senaida Oresichardson for concern of DME OD Lab Results  Component Value Date   HGBA1C 5.9 (H) 10/29/2015       CURRENT MEDICATIONS: Current Outpatient Medications (Ophthalmic Drugs)  Medication Sig  . Aflibercept (EYLEA) 2 MG/0.05ML SOLN 2 mg by Intravitreal route every 5 (five) weeks.    Current Facility-Administered Medications (Ophthalmic Drugs)  Medication Route  . aflibercept (EYLEA) SOLN 2 mg Intravitreal  . aflibercept (EYLEA) SOLN 2 mg Intravitreal  . aflibercept (EYLEA) SOLN 2 mg Intravitreal  . aflibercept (EYLEA) SOLN 2 mg Intravitreal  . aflibercept (EYLEA) SOLN 2 mg Intravitreal   Current Outpatient Medications (Other)  Medication Sig  . acetaminophen (TYLENOL) 500 MG tablet Take 1,000 mg by mouth every 6 (six) hours as needed for moderate pain or headache.  . ALPRAZolam (XANAX) 0.25 MG tablet Take 0.25 mg by mouth daily as needed for anxiety or sleep.   [name]. amLODipine (NORVASC) 5 MG tablet  Take by mouth.  . Ascorbic Acid (VITAMIN C) 1000 MG tablet Take 1,000 mg by mouth daily.  [name]. aspirin EC 81 MG tablet Take 81 mg by mouth daily.   [name]. buPROPion (WELLBUTRIN XL) 150 MG 24 hr tablet Take 150 mg by mouth daily.   [name]. buPROPion (WELLBUTRIN XL) 300 MG 24 hr tablet Take 300 mg by mouth daily.  . calcium carbonate (TUMS EX) 750 MG chewable tablet Chew 2,250 mg by mouth daily as needed for heartburn.  . chlorhexidine (PERIDEX) 0.12 % solution Use as directed 15 mLs in the mouth or throat 2 (two) times daily.   . cholecalciferol (VITAMIN D) 1000 units tablet Take 1,000 Units by mouth 2 (two) times daily.  [name]. CORAL CALCIUM PO Take 1 tablet by mouth 2 (two) times daily.   [name]. denosumab (PROLIA) 60 MG/ML SOLN injection Inject 60 mg into the skin every 6 (six) months.   . DENTA 5000 PLUS 1.1 % CREA dental cream Place 1 application onto teeth 2 (two) times daily. as directed  . DULoxetine (CYMBALTA) 60 MG capsule Take 1 capsule (60 mg total) by mouth 2 (two) times daily.  [name]. ELIQUIS 5 MG TABS tablet TAKE 1 TABLET BY MOUTH TWICE A DAY  . famotidine (PEPCID) 40 MG tablet Take 40 mg by mouth 2 (two) times daily.   . ferrous gluconate (FERGON) 324 MG tablet Take 324  mg by mouth daily with breakfast.  . furosemide (LASIX) 20 MG tablet Take 20 mg by mouth daily as needed for fluid or edema.   . gabapentin (NEURONTIN) 100 MG capsule Take 1 capsule by mouth daily.  . Insulin Pen Needle (B-D UF III MINI PEN NEEDLES) 31G X 5 MM MISC once daily.  [name] levothyroxine (SYNTHROID, LEVOTHROID) 100 MCG tablet Take 100 mcg by mouth daily before breakfast.   . lisinopril (PRINIVIL,ZESTRIL) 10 MG tablet Take 10 mg by mouth every morning.   . Magnesium 500 MG TABS Take 500 mg by mouth every morning.   . metoprolol succinate (TOPROL-XL) 50 MG 24 hr tablet Take by mouth.  . mirtazapine (REMERON) 15 MG tablet Take by mouth.  . Multiple Vitamin (MULTIVITAMIN WITH MINERALS) TABS tablet Take 1 tablet by mouth daily.  .  pantoprazole (PROTONIX) 40 MG tablet Take 40 mg by mouth every morning.   . rosuvastatin (CRESTOR) 20 MG tablet Take 20 mg by mouth every morning.   . traMADol (ULTRAM) 50 MG tablet Take 1 tablet (50 mg total) by mouth every 6 (six) hours as needed.  . TRESIBA FLEXTOUCH 200 UNIT/ML SOPN Inject 30 Units as directed at bedtime.   . TYMLOS 3120 MCG/1.56ML SOPN   . VASCEPA 1 g CAPS Take 1 capsule by mouth 2 (two) times daily.  . vitamin B-12 (CYANOCOBALAMIN) 1000 MCG tablet Take 1,000 mcg by mouth daily.  . vitamin E 400 UNIT capsule Take 400 Units by mouth daily.  [name] zolpidem (AMBIEN) 5 MG tablet Take 1 tablet (5 mg total) by mouth at bedtime. (Patient taking differently: Take 5 mg by mouth at bedtime as needed. )   Current Facility-Administered Medications (Other)  Medication Route  . Bevacizumab (AVASTIN) SOLN 1.25 mg Intravitreal  . Bevacizumab (AVASTIN) SOLN 1.25 mg Intravitreal  . Bevacizumab (AVASTIN) SOLN 1.25 mg Intravitreal  . Bevacizumab (AVASTIN) SOLN 1.25 mg Intravitreal  . insulin aspart (novoLOG) injection 0-15 Units Subcutaneous  . insulin aspart (novoLOG) injection 0-5 Units Subcutaneous      REVIEW OF SYSTEMS: ROS    Positive for: Musculoskeletal, Endocrine, Cardiovascular, Eyes   Negative for: Constitutional, Gastrointestinal, Neurological, Skin, Genitourinary, HENT, Respiratory, Psychiatric, Allergic/Imm, Heme/Lymph   Last edited by Laddie Aquas, COA on 04/16/2019  2:24 PM. (History)       ALLERGIES No Known Allergies  PAST MEDICAL HISTORY Past Medical History:  Diagnosis Date  . Anemia   . Anxiety   . Arthritis    Gout  . Cataracts, both eyes   . Diabetic retinopathy (HCC)    NPDR OU  . Diabetic retinopathy (HCC)   . GERD (gastroesophageal reflux disease)   . Gout   . Headache    h/o migraines  . History of fracture of patella    right knee  . History of positive PPD    Patient always shows positive  . Hyperlipidemia   . Hypertension   .  Hypertensive retinopathy    OU  . Hypothyroidism   . Lichen sclerosus 12/30/2013   of vulva  . Metatarsal fracture   . Neuropathy   . Peripheral vascular disease (HCC)   . Polyneuropathy    numbness and tingling in feet and toes  . Renal insufficiency    Stage 3  . Sleep apnea    does not use cpap-lost weight   . Type 2 diabetes mellitus, uncontrolled (HCC)    Past Surgical History:  Procedure Laterality Date  . ABDOMINAL HYSTERECTOMY    .  APPENDECTOMY    . BREAST REDUCTION SURGERY  2001  . CATARACT EXTRACTION    . CESAREAN SECTION  1976  . COLONOSCOPY  03/05/2013   Nml - due for repeat 03/06/2018  . COLONOSCOPY WITH PROPOFOL N/A 03/18/2019   Procedure: COLONOSCOPY WITH PROPOFOL;  Surgeon: Toledo, Benay Pike, MD;  Location: [facility];  Service: Gastroenterology;  Laterality: N/A;  . DIAGNOSTIC LAPAROSCOPY    . DILATION AND CURETTAGE OF UTERUS  1989  . ENDARTERECTOMY FEMORAL Bilateral 03/09/2018   Procedure: ENDARTERECTOMY FEMORAL;  Surgeon: Algernon Huxley, MD;  Location: [facility];  Service: Vascular;  Laterality: Bilateral;  . ENDARTERECTOMY POPLITEAL Left 03/09/2018   Procedure: ENDARTERECTOMY POPLITEAL AND SFA;  Surgeon: Algernon Huxley, MD;  Location: [facility];  Service: Vascular;  Laterality: Left;  . ESOPHAGOGASTRODUODENOSCOPY  03/05/2013  . ESOPHAGOGASTRODUODENOSCOPY (EGD) WITH PROPOFOL N/A 03/18/2019   Procedure: ESOPHAGOGASTRODUODENOSCOPY (EGD) WITH PROPOFOL;  Surgeon: Toledo, Benay Pike, MD;  Location: [facility];  Service: Gastroenterology;  Laterality: N/A;  . EYE SURGERY    . Eyelid Surgery  2012  . INTRAMEDULLARY (IM) NAIL INTERTROCHANTERIC Left 10/30/2015   Procedure: INTRAMEDULLARY (IM) NAIL INTERTROCHANTRIC ;  Surgeon: Hessie Knows, MD;  Location: [facility];  Service: Orthopedics;  Laterality: Left;  . KYPHOPLASTY N/A 10/25/2018   Procedure: L4 KYPHOPLASTY;  Surgeon: Hessie Knows, MD;  Location: [facility];  Service: Orthopedics;  Laterality: N/A;  . LAPAROSCOPIC  HYSTERECTOMY  2000   total  . LOWER EXTREMITY ANGIOGRAPHY Left 03/08/2017   Procedure: LOWER EXTREMITY ANGIOGRAPHY;  Surgeon: Algernon Huxley, MD;  Location: [place] [facility];  Service: Cardiovascular;  Laterality: Left;  . LOWER EXTREMITY ANGIOGRAPHY Left 10/30/2017   Procedure: LOWER EXTREMITY ANGIOGRAPHY;  Surgeon: Algernon Huxley, MD;  Location: [place] [facility];  Service: Cardiovascular;  Laterality: Left;  . LOWER EXTREMITY ANGIOGRAPHY Right 03/08/2018   Procedure: LOWER EXTREMITY ANGIOGRAPHY;  Surgeon: Algernon Huxley, MD;  Location: [place] [facility];  Service: Cardiovascular;  Laterality: Right;  . LOWER EXTREMITY ANGIOGRAPHY Left 10/01/2018   Procedure: LOWER EXTREMITY ANGIOGRAPHY;  Surgeon: Algernon Huxley, MD;  Location: [place] [facility];  Service: Cardiovascular;  Laterality: Left;  . LOWER EXTREMITY ANGIOGRAPHY Right 10/08/2018   Procedure: LOWER EXTREMITY ANGIOGRAPHY;  Surgeon: Algernon Huxley, MD;  Location: [place] [facility];  Service: Cardiovascular;  Laterality: Right;  . PERIPHERAL VASCULAR INTERVENTION  03/08/2018   Procedure: PERIPHERAL VASCULAR INTERVENTION;  Surgeon: Algernon Huxley, MD;  Location: [place] [facility];  Service: Cardiovascular;;  . REDUCTION MAMMAPLASTY  1997  . SACROPLASTY N/A 10/25/2018   Procedure: S1 SACROPLASTY;  Surgeon: Hessie Knows, MD;  Location: [facility];  Service: Orthopedics;  Laterality: N/A;    FAMILY HISTORY Family History  Problem Relation Age of Onset  . Coronary artery disease Father   . Heart attack Father   . Coronary artery disease Mother   . Heart attack Mother   . Ovarian cancer Sister 40       sister had hormonal therapy for IVF txs-which increased risk factor for ovarian cancer  . Breast cancer Neg Hx     SOCIAL HISTORY Social History   Tobacco Use  . Smoking status: Former Smoker    Packs/day: 1.00    Years: 20.00    Pack years: 20.00    Types: Cigarettes    Quit date: 03/07/1996    Years since quitting: 23.1  .  Smokeless tobacco: Never Used  . Tobacco comment: started smoking at age  12  Substance Use Topics  . Alcohol use: No    Alcohol/week: 0.0 standard drinks  . Drug use: No         OPHTHALMIC EXAM:  Base Eye Exam    Visual Acuity (Snellen - Linear)      Right Left   Dist [place] 20/40 20/20   Dist ph [place] NI        Tonometry (Tonopen, 2:21 PM)      Right Left   Pressure 14 13       Pupils      Dark Light Shape React APD   Right 2 1 Round Brisk None   Left 2 1 Round Brisk None       Visual Fields (Counting fingers)      Left Right    Full Full       Extraocular Movement      Right Left    Full, Ortho Full, Ortho       Neuro/Psych    Oriented x3: Yes   Mood/Affect: Normal       Dilation    Both eyes: 1.0% Mydriacyl, 2.5% Phenylephrine @ 2:21 PM        Slit Lamp and Fundus Exam    External Exam      Right Left   External Normal Normal       Slit Lamp Exam      Right Left   Lids/Lashes dermatochalasis dermatochalasis   Conjunctiva/Sclera White and quiet White and quiet   Cornea arcus; well healed cataract wound; 2-3+ diffuse Punctate epithelial erosions, decreased TBUT, mild Anterior basement membrane dystrophy superiorly arcus; well healed cataract wound, 2-3+ diffuse Punctate epithelial erosions, irregualr epi surface, decreased TBUT   Anterior Chamber Deep and quiet Deep and quiet   Iris Round and dilated Round and dilated   Lens PCIOL; open PC PCIOL; open PC   Vitreous syneresis, Posterior vitreous detachment, vitreous condensations inferiorly syneresis, Posterior vitreous detachment       Fundus Exam      Right Left   Disc Superior hyperemia and mild edema, +IRH superior disc - persistent, mild Pallor Pink and Sharp   C/D Ratio 0.5 0.5   Macula Flat, Blunted foveal reflex, trace cystic changes, +Epiretinal membrane, no heme flat; blunted foveal reflex, no heme or edema, small pigment clump IT to fovea   Vessels Vascular attenuation, Tortuous Vascular  attenuation   Periphery attached; 360 MAs/DBH inferior Attached, no heme          IMAGING AND PROCEDURES  Imaging and Procedures for 04/25/17  OCT, Retina - OU - Both Eyes       Right Eye Quality was good. Central Foveal Thickness: 348. Progression has been stable. Findings include abnormal foveal contour, epiretinal membrane, intraretinal fluid, no SRF (Persistent cystic changes IT to fovea).   Left Eye Quality was good. Central Foveal Thickness: 281. Progression has been stable. Findings include normal foveal contour, no IRF, no SRF (Trace ERM).   Notes *Images captured and stored on drive  Diagnosis / Impression:  OD: Persistent cystic changes IT to fovea OS: NFP; no IRF/SRF--stable, trace ERM  Clinical management:  See below  Abbreviations: NFP - Normal foveal profile. CME - cystoid macular edema. PED - pigment epithelial detachment. IRF - intraretinal fluid. SRF - subretinal fluid. EZ - ellipsoid zone. ERM - epiretinal membrane. ORA - outer retinal atrophy. ORT - outer retinal tubulation. SRHM - subretinal hyper-reflective material        Intravitreal Injection,  Pharmacologic Agent - OD - Right Eye       Time Out 04/16/2019. 2:15 PM. Confirmed correct patient, procedure, site, and patient consented.   Anesthesia Topical anesthesia was used. Anesthetic medications included Proparacaine 0.5%, Lidocaine 2%.   Procedure Preparation included 5% betadine to ocular surface, eyelid speculum. A (32g) needle was used.   Injection:  2 mg aflibercept Gretta Cool) SOLN   NDC: L6038910, Lot: [id number], Expiration date: 09/30/2019   Route: Intravitreal, Site: Right Eye, Waste: 0.05 mL  Post-op Post injection exam found visual acuity of at least counting fingers. The patient tolerated the procedure well. There were no complications. The patient received written and verbal post procedure care education.                 ASSESSMENT/PLAN:    ICD-10-CM   1. Branch  retinal vein occlusion of right eye with macular edema  H34.8310 Intravitreal Injection, Pharmacologic Agent - OD - Right Eye    aflibercept (EYLEA) SOLN 2 mg  2. Retinal edema  H35.81 OCT, Retina - OU - Both Eyes  3. Both eyes affected by mild nonproliferative diabetic retinopathy with macular edema, associated with type 2 diabetes mellitus (HCC)  W29.5621   4. Essential hypertension  I10   5. Hypertensive retinopathy of both eyes  H35.033   6. Epiretinal membrane (ERM) of right eye  H35.371   7. Pseudophakia of both eyes  Z96.1   8. 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 BRVO w/ CME OD  - by history, pt states symptoms first noticed 2 wks prior to presentation, but reports changes may have occurred prior  - initial exam with differential tortuosity of vessels (OD > OS)  - FA (02.10.20) shows mild late staining / leakage in macula, staining / leakage of disc -- improving CME  - differential includes DM2 (DME), hypertensive retinopathy, inflammatory etiology / uveitis  - S/P IVA OD #1 (02.08.19), #2 (03.11.19), #3 (04.09.19), #4 (05.20.19), #5 (02.10.20)  - gave IVA OD on 2.10.20 due to pending Eylea4U for 2020 -- resulted in increased IRF/CME  - review of OCTs show persistent IRF and cystic changes --  resistance to IVA   - June 2019 -- switched therapies: S/P IVE OD #1 (06.24.19), #2 (07.24.19), #3 (09.04.19), #4 (10.30.19),#5 (12.30.19), #6 (03.23.20), #7 (05.05.20), #8 (07.16.20), #9 (07.17.20), #10 (08.28.20), #11 (10.13.20), # 12 (11.17.20), #13 (2.8.21), #14 (03.09.21)  - OCT today shows persistent IRF/cystic changes  - BCVA decreased to 20/40 from 20/30   - Eylea4U benefits investigation for 2021 now completed and pt approved for IVE  - recommend IVE OD #15 today, 04.13.21  - RBA of procedure discussed, questions answered  - informed consent obtained  - see procedure note  - Eylea informed consent form obtained and scanned on 11.19.2020  - F/U 4 weeks  --  DFE/OCT/possible injection  3. Mild nonproliferative diabetic retinopathy, both eyes  - The incidence, risk factors for progression, natural history and treatment options for diabetic retinopathy were discussed with patient.    - The need for close monitoring of blood glucose, blood pressure, and serum lipids, avoiding cigarette or any type of tobacco, and the need for long term follow up was also discussed with patient.  - could be contributing to CME OD  - OS with minimal diabetic retinopathy  - continue to monitor  4,5. Hypertensive retinopathy OU - stable  - as above, may have contributing to CME OD  -  discussed importance of tight BP control  - monitor  6. Epiretinal membrane, right eye   - stable nasal ERM  - no indication for surgery at this time  7. Pseudophakia OU  - s/p CE/IOL OU by cataract surgeon [facility]  - doing well  - monitor  8. Optic disc edema OD -- sectoral  - likely secondary to BRVO but differential includes carotid stenosis and retro-orbital mass  - history of blood clots  - recommend CT orbits w/ contrast to r/o retro-orbital mass -- not obtained  - recommend carotid dopplers to r/o stenosis / occlusion -- pt scheduled for repeat u/s due to recent bilateral femoral endarterectomies -- strong history of peripheral vascular disease   Ophthalmic Meds Ordered this visit:  Meds ordered this encounter  Medications  . aflibercept (EYLEA) SOLN 2 mg       Return in about 4 weeks (around 05/14/2019) for f/u BRVO OD, DFE, OCT.  There are no Patient Instructions on file for this visit.   Explained the diagnoses, plan, and follow up with the patient and they expressed understanding.  Patient expressed understanding of the importance of proper follow up care.   This document serves as a record of services personally performed by Karie Chimera, MD, PhD. It was created on their behalf by Cristopher Estimable, COT an ophthalmic technician. The creation of this record is  the provider's dictation and/or activities during the visit.    Electronically signed by: Cristopher Estimable, COT 04/16/19 @ 5:02 PM   This document serves as a record of services personally performed by Karie Chimera, MD, PhD. It was created on their behalf by Laurian Brim, OA, an ophthalmic assistant. The creation of this record is the provider's dictation and/or activities during the visit.    Electronically signed by: Laurian Brim, OA 04.13.2021 5:02 PM  Karie Chimera, M.D., Ph.D. Diseases & Surgery of the Retina and Vitreous [facility] 04/16/2019   I have reviewed the above documentation for accuracy and completeness, and I agree with the above. Karie Chimera, M.D., Ph.D. 04/16/19 5:02 PM   Abbreviations: M myopia (nearsighted); A astigmatism; H hyperopia (farsighted); P presbyopia; Mrx spectacle prescription;  CTL contact lenses; OD right eye; OS left eye; OU both eyes  XT exotropia; ET esotropia; PEK punctate epithelial keratitis; PEE punctate epithelial erosions; DES dry eye syndrome; MGD meibomian gland dysfunction; ATs artificial tears; PFAT's preservative free artificial tears; NSC nuclear sclerotic cataract; PSC posterior subcapsular cataract; ERM epi-retinal membrane; PVD posterior vitreous detachment; RD retinal detachment; DM diabetes mellitus; DR diabetic retinopathy; NPDR non-proliferative diabetic retinopathy; PDR proliferative diabetic retinopathy; CSME clinically significant macular edema; DME diabetic macular edema; dbh dot blot hemorrhages; CWS cotton wool spot; POAG primary open angle glaucoma; C/D cup-to-disc ratio; HVF humphrey visual field; GVF goldmann visual field; OCT optical coherence tomography; IOP intraocular pressure; BRVO Branch retinal vein occlusion; CRVO central retinal vein occlusion; CRAO central retinal artery occlusion; BRAO branch retinal artery occlusion; RT retinal tear; SB scleral buckle; PPV pars plana vitrectomy; VH Vitreous  hemorrhage; PRP panretinal laser photocoagulation; IVK intravitreal kenalog; VMT vitreomacular traction; MH Macular hole;  NVD neovascularization of the disc; NVE neovascularization elsewhere; AREDS age related eye disease study; ARMD age related macular degeneration; POAG primary open angle glaucoma; EBMD epithelial/anterior basement membrane dystrophy; ACIOL anterior chamber intraocular lens; IOL intraocular lens; PCIOL posterior chamber intraocular lens; Phaco/IOL phacoemulsification with intraocular lens placement; PRK photorefractive keratectomy; LASIK laser assisted in situ keratomileusis; HTN  hypertension; DM diabetes mellitus; COPD chronic obstructive pulmonary disease

## 2019-05-14 ENCOUNTER — Ambulatory Visit (INDEPENDENT_AMBULATORY_CARE_PROVIDER_SITE_OTHER): Payer: Medicare Other | Admitting: Ophthalmology

## 2019-05-14 ENCOUNTER — Other Ambulatory Visit: Payer: Self-pay

## 2019-05-14 ENCOUNTER — Encounter (INDEPENDENT_AMBULATORY_CARE_PROVIDER_SITE_OTHER): Payer: Self-pay | Admitting: Ophthalmology

## 2019-05-14 DIAGNOSIS — H43391 Other vitreous opacities, right eye: Secondary | ICD-10-CM

## 2019-05-14 DIAGNOSIS — H35371 Puckering of macula, right eye: Secondary | ICD-10-CM

## 2019-05-14 DIAGNOSIS — H34831 Tributary (branch) retinal vein occlusion, right eye, with macular edema: Secondary | ICD-10-CM | POA: Diagnosis not present

## 2019-05-14 DIAGNOSIS — E113213 Type 2 diabetes mellitus with mild nonproliferative diabetic retinopathy with macular edema, bilateral: Secondary | ICD-10-CM

## 2019-05-14 DIAGNOSIS — I10 Essential (primary) hypertension: Secondary | ICD-10-CM | POA: Diagnosis not present

## 2019-05-14 DIAGNOSIS — H3581 Retinal edema: Secondary | ICD-10-CM | POA: Diagnosis not present

## 2019-05-14 DIAGNOSIS — Z961 Presence of intraocular lens: Secondary | ICD-10-CM

## 2019-05-14 DIAGNOSIS — H4710 Unspecified papilledema: Secondary | ICD-10-CM

## 2019-05-14 DIAGNOSIS — H35033 Hypertensive retinopathy, bilateral: Secondary | ICD-10-CM

## 2019-05-14 MED ORDER — AFLIBERCEPT 2MG/0.05ML IZ SOLN FOR KALEIDOSCOPE
2.0000 mg | INTRAVITREAL | Status: AC | PRN
  Administered 2019-05-14: 2 mg via INTRAVITREAL

## 2019-05-14 NOTE — Progress Notes (Signed)
[facility] - Clinic Note  05/14/2019     CHIEF COMPLAINT Patient presents for Retina Follow Up   HISTORY OF PRESENT ILLNESS: Cheryl Leonard is a 74 y.o. female who presents to the clinic today for:   HPI    Retina Follow Up    Patient presents with  CRVO/BRVO.  In right eye.  Severity is moderate.  Duration of 4 weeks.  Since onset it is stable.  I, the attending physician,  performed the HPI with the patient and updated documentation appropriately.          Comments    Pt states vision is the same OU.  Pt denies eye pain or discomfort and denies any new or worsening floaters or fol OU.       Last edited by Rennis ChrisZamora, Conleigh Heinlein, MD on 05/14/2019 11:10 PM. (History)    Patient reports vision is better, she states the dilation drops have made her vision extra blurry today  Referring physician: Danella PentonMiller, Mark F, MD [address] [facility] [place] [id number]  HISTORICAL INFORMATION:   Selected notes from the MEDICAL RECORD NUMBER Referred by Dr. Senaida Oresichardson for concern of DME OD Lab Results  Component Value Date   HGBA1C 5.9 (H) 10/29/2015       CURRENT MEDICATIONS: Current Outpatient Medications (Ophthalmic Drugs)  Medication Sig  . Aflibercept (EYLEA) 2 MG/0.05ML SOLN 2 mg by Intravitreal route every 5 (five) weeks.    Current Facility-Administered Medications (Ophthalmic Drugs)  Medication Route  . aflibercept (EYLEA) SOLN 2 mg Intravitreal  . aflibercept (EYLEA) SOLN 2 mg Intravitreal  . aflibercept (EYLEA) SOLN 2 mg Intravitreal  . aflibercept (EYLEA) SOLN 2 mg Intravitreal  . aflibercept (EYLEA) SOLN 2 mg Intravitreal   Current Outpatient Medications (Other)  Medication Sig  . acetaminophen (TYLENOL) 500 MG tablet Take 1,000 mg by mouth every 6 (six) hours as needed for moderate pain or headache.  . ALPRAZolam (XANAX) 0.25 MG tablet Take 0.25 mg by mouth daily as needed for anxiety or sleep.   [name].  amLODipine (NORVASC) 5 MG tablet Take by mouth.  . Ascorbic Acid (VITAMIN C) 1000 MG tablet Take 1,000 mg by mouth daily.  [name]. aspirin EC 81 MG tablet Take 81 mg by mouth daily.   [name]. buPROPion (WELLBUTRIN XL) 150 MG 24 hr tablet Take 150 mg by mouth daily.   [name]. buPROPion (WELLBUTRIN XL) 300 MG 24 hr tablet Take 300 mg by mouth daily.  . calcium carbonate (TUMS EX) 750 MG chewable tablet Chew 2,250 mg by mouth daily as needed for heartburn.  . chlorhexidine (PERIDEX) 0.12 % solution Use as directed 15 mLs in the mouth or throat 2 (two) times daily.   . cholecalciferol (VITAMIN D) 1000 units tablet Take 1,000 Units by mouth 2 (two) times daily.  [name]. CORAL CALCIUM PO Take 1 tablet by mouth 2 (two) times daily.   [name]. denosumab (PROLIA) 60 MG/ML SOLN injection Inject 60 mg into the skin every 6 (six) months.   . DENTA 5000 PLUS 1.1 % CREA dental cream Place 1 application onto teeth 2 (two) times daily. as directed  . DULoxetine (CYMBALTA) 60 MG capsule Take 1 capsule (60 mg total) by mouth 2 (two) times daily.  [name]. ELIQUIS 5 MG TABS tablet TAKE 1 TABLET BY MOUTH TWICE A DAY  . famotidine (PEPCID) 40 MG tablet Take 40 mg by mouth 2 (two) times daily.   . ferrous gluconate (FERGON)  324 MG tablet Take 324 mg by mouth daily with breakfast.  . gabapentin (NEURONTIN) 100 MG capsule Take 1 capsule by mouth daily.  . Insulin Pen Needle (B-D UF III MINI PEN NEEDLES) 31G X 5 MM MISC once daily.  [name] levothyroxine (SYNTHROID, LEVOTHROID) 100 MCG tablet Take 100 mcg by mouth daily before breakfast.   . lisinopril (PRINIVIL,ZESTRIL) 10 MG tablet Take 10 mg by mouth every morning.   . Magnesium 500 MG TABS Take 500 mg by mouth every morning.   . metoprolol succinate (TOPROL-XL) 50 MG 24 hr tablet Take by mouth.  . mirtazapine (REMERON) 15 MG tablet Take by mouth.  . Multiple Vitamin (MULTIVITAMIN WITH MINERALS) TABS tablet Take 1 tablet by mouth daily.  . pantoprazole (PROTONIX) 40 MG tablet Take 40 mg by mouth every  morning.   . traMADol (ULTRAM) 50 MG tablet Take 1 tablet (50 mg total) by mouth every 6 (six) hours as needed.  . TRESIBA FLEXTOUCH 200 UNIT/ML SOPN Inject 30 Units as directed at bedtime.   . TYMLOS 3120 MCG/1.56ML SOPN   . VASCEPA 1 g CAPS Take 1 capsule by mouth 2 (two) times daily.  . vitamin B-12 (CYANOCOBALAMIN) 1000 MCG tablet Take 1,000 mcg by mouth daily.  . vitamin E 400 UNIT capsule Take 400 Units by mouth daily.  [name] zolpidem (AMBIEN) 5 MG tablet Take 1 tablet (5 mg total) by mouth at bedtime. (Patient taking differently: Take 5 mg by mouth at bedtime as needed. )  . furosemide (LASIX) 20 MG tablet Take 20 mg by mouth daily as needed for fluid or edema.   . rosuvastatin (CRESTOR) 20 MG tablet Take 20 mg by mouth every morning.    Current Facility-Administered Medications (Other)  Medication Route  . Bevacizumab (AVASTIN) SOLN 1.25 mg Intravitreal  . Bevacizumab (AVASTIN) SOLN 1.25 mg Intravitreal  . Bevacizumab (AVASTIN) SOLN 1.25 mg Intravitreal  . Bevacizumab (AVASTIN) SOLN 1.25 mg Intravitreal  . insulin aspart (novoLOG) injection 0-15 Units Subcutaneous  . insulin aspart (novoLOG) injection 0-5 Units Subcutaneous      REVIEW OF SYSTEMS: ROS    Positive for: Musculoskeletal, Endocrine, Cardiovascular, Eyes   Negative for: Constitutional, Gastrointestinal, Neurological, Skin, Genitourinary, HENT, Respiratory, Psychiatric, Allergic/Imm, Heme/Lymph   Last edited by Annalee Genta D, COT on 05/14/2019  2:44 PM. (History)       ALLERGIES No Known Allergies  PAST MEDICAL HISTORY Past Medical History:  Diagnosis Date  . Anemia   . Anxiety   . Arthritis    Gout  . Cataracts, both eyes   . Diabetic retinopathy (HCC)    NPDR OU  . Diabetic retinopathy (HCC)   . GERD (gastroesophageal reflux disease)   . Gout   . Headache    h/o migraines  . History of fracture of patella    right knee  . History of positive PPD    Patient always shows positive  . Hyperlipidemia    . Hypertension   . Hypertensive retinopathy    OU  . Hypothyroidism   . Lichen sclerosus 12/30/2013   of vulva  . Metatarsal fracture   . Neuropathy   . Peripheral vascular disease (HCC)   . Polyneuropathy    numbness and tingling in feet and toes  . Renal insufficiency    Stage 3  . Sleep apnea    does not use cpap-lost weight   . Type 2 diabetes mellitus, uncontrolled (HCC)    Past Surgical History:  Procedure Laterality Date  .  ABDOMINAL HYSTERECTOMY    . APPENDECTOMY    . BREAST REDUCTION SURGERY  2001  . CATARACT EXTRACTION    . CESAREAN SECTION  1976  . COLONOSCOPY  03/05/2013   Nml - due for repeat 03/06/2018  . COLONOSCOPY WITH PROPOFOL N/A 03/18/2019   Procedure: COLONOSCOPY WITH PROPOFOL;  Surgeon: Toledo, [name], MD;  Location: [facility];  Service: Gastroenterology;  Laterality: N/A;  . DIAGNOSTIC LAPAROSCOPY    . DILATION AND CURETTAGE OF UTERUS  1989  . ENDARTERECTOMY FEMORAL Bilateral 03/09/2018   Procedure: ENDARTERECTOMY FEMORAL;  Surgeon: Annice Needy, MD;  Location: [facility];  Service: Vascular;  Laterality: Bilateral;  . ENDARTERECTOMY POPLITEAL Left 03/09/2018   Procedure: ENDARTERECTOMY POPLITEAL AND SFA;  Surgeon: Annice Needy, MD;  Location: [facility];  Service: Vascular;  Laterality: Left;  . ESOPHAGOGASTRODUODENOSCOPY  03/05/2013  . ESOPHAGOGASTRODUODENOSCOPY (EGD) WITH PROPOFOL N/A 03/18/2019   Procedure: ESOPHAGOGASTRODUODENOSCOPY (EGD) WITH PROPOFOL;  Surgeon: Toledo, [name], MD;  Location: [facility];  Service: Gastroenterology;  Laterality: N/A;  . EYE SURGERY    . Eyelid Surgery  2012  . INTRAMEDULLARY (IM) NAIL INTERTROCHANTERIC Left 10/30/2015   Procedure: INTRAMEDULLARY (IM) NAIL INTERTROCHANTRIC ;  Surgeon: Kennedy Bucker, MD;  Location: [facility];  Service: Orthopedics;  Laterality: Left;  . KYPHOPLASTY N/A 10/25/2018   Procedure: L4 KYPHOPLASTY;  Surgeon: Kennedy Bucker, MD;  Location: [facility];  Service: Orthopedics;  Laterality: N/A;   . LAPAROSCOPIC HYSTERECTOMY  2000   total  . LOWER EXTREMITY ANGIOGRAPHY Left 03/08/2017   Procedure: LOWER EXTREMITY ANGIOGRAPHY;  Surgeon: Annice Needy, MD;  Location: [facility];  Service: Cardiovascular;  Laterality: Left;  . LOWER EXTREMITY ANGIOGRAPHY Left 10/30/2017   Procedure: LOWER EXTREMITY ANGIOGRAPHY;  Surgeon: Annice Needy, MD;  Location: [facility];  Service: Cardiovascular;  Laterality: Left;  . LOWER EXTREMITY ANGIOGRAPHY Right 03/08/2018   Procedure: LOWER EXTREMITY ANGIOGRAPHY;  Surgeon: Annice Needy, MD;  Location: [facility];  Service: Cardiovascular;  Laterality: Right;  . LOWER EXTREMITY ANGIOGRAPHY Left 10/01/2018   Procedure: LOWER EXTREMITY ANGIOGRAPHY;  Surgeon: Annice Needy, MD;  Location: [facility];  Service: Cardiovascular;  Laterality: Left;  . LOWER EXTREMITY ANGIOGRAPHY Right 10/08/2018   Procedure: LOWER EXTREMITY ANGIOGRAPHY;  Surgeon: Annice Needy, MD;  Location: [facility];  Service: Cardiovascular;  Laterality: Right;  . PERIPHERAL VASCULAR INTERVENTION  03/08/2018   Procedure: PERIPHERAL VASCULAR INTERVENTION;  Surgeon: Annice Needy, MD;  Location: [facility];  Service: Cardiovascular;;  . REDUCTION MAMMAPLASTY  1997  . SACROPLASTY N/A 10/25/2018   Procedure: S1 SACROPLASTY;  Surgeon: Kennedy Bucker, MD;  Location: [facility];  Service: Orthopedics;  Laterality: N/A;    FAMILY HISTORY Family History  Problem Relation Age of Onset  . Coronary artery disease Father   . Heart attack Father   . Coronary artery disease Mother   . Heart attack Mother   . Ovarian cancer Sister 52       sister had hormonal therapy for IVF txs-which increased risk factor for ovarian cancer  . Breast cancer Neg Hx     SOCIAL HISTORY Social History   Tobacco Use  . Smoking status: Former Smoker    Packs/day: 1.00    Years: 20.00    Pack years: 20.00    Types: Cigarettes    Quit date: 03/07/1996    Years since  quitting: 23.2  . Smokeless tobacco: Never Used  .  Tobacco comment: started smoking at age 35  Substance Use Topics  . Alcohol use: No    Alcohol/week: 0.0 standard drinks  . Drug use: No         OPHTHALMIC EXAM:  Base Eye Exam    Visual Acuity (Snellen - Linear)      Right Left   Dist [place] 20/30 -2 20/25 -2   Dist ph   20/25       Tonometry (Tonopen, 2:49 PM)      Right Left   Pressure 20 16       Pupils      Dark Light Shape React APD   Right 3 2 Round Brisk 0   Left 3 2 Round Brisk 0       Visual Fields      Left Right    Full Full       Extraocular Movement      Right Left    Full Full       Neuro/Psych    Oriented x3: Yes   Mood/Affect: Normal       Dilation    Both eyes: 1.0% Mydriacyl, 2.5% Phenylephrine @ 2:49 PM        Slit Lamp and Fundus Exam    External Exam      Right Left   External Normal Normal       Slit Lamp Exam      Right Left   Lids/Lashes dermatochalasis dermatochalasis   Conjunctiva/Sclera White and quiet White and quiet   Cornea arcus; well healed cataract wound; 2-3+ diffuse Punctate epithelial erosions, decreased TBUT, mild Anterior basement membrane dystrophy superiorly arcus; well healed cataract wound, 2-3+ diffuse Punctate epithelial erosions, irregualr epi surface, decreased TBUT   Anterior Chamber Deep and quiet Deep and quiet   Iris Round and dilated Round and dilated   Lens PCIOL; open PC PCIOL; open PC   Vitreous syneresis, Posterior vitreous detachment, vitreous condensations inferiorly syneresis, Posterior vitreous detachment       Fundus Exam      Right Left   Disc Superior hyperemia and mild edema, +IRH superior disc - persistent, mild Pallor Pink and Sharp   C/D Ratio 0.5 0.5   Macula Flat, Blunted foveal reflex, trace cystic changes, +Epiretinal membrane, no heme flat; blunted foveal reflex, no heme or edema, small pigment clump IT to fovea   Vessels Vascular attenuation, Tortuous Vascular attenuation    Periphery attached; 360 MAs/DBH inferior Attached, no heme          IMAGING AND PROCEDURES  Imaging and Procedures for 04/25/17  OCT, Retina - OU - Both Eyes       Right Eye Quality was good. Central Foveal Thickness: 369. Progression has worsened. Findings include abnormal foveal contour, epiretinal membrane, intraretinal fluid, no SRF (Mild interval improvement in  cystic changes IT to fovea).   Left Eye Quality was good. Central Foveal Thickness: 278. Progression has been stable. Findings include normal foveal contour, no IRF, no SRF (Trace ERM).   Notes *Images captured and stored on drive  Diagnosis / Impression:  OD: mild interval  increase in cystic changes IT to fovea OS: NFP; no IRF/SRF--stable, trace ERM  Clinical management:  See below  Abbreviations: NFP - Normal foveal profile. CME - cystoid macular edema. PED - pigment epithelial detachment. IRF - intraretinal fluid. SRF - subretinal fluid. EZ - ellipsoid zone. ERM - epiretinal membrane. ORA - outer retinal atrophy. ORT - outer retinal tubulation. SRHM - subretinal  hyper-reflective material        Intravitreal Injection, Pharmacologic Agent - OD - Right Eye       Time Out 05/14/2019. 3:31 PM. Confirmed correct patient, procedure, site, and patient consented.   Anesthesia Topical anesthesia was used. Anesthetic medications included Lidocaine 2%, Proparacaine 0.5%.   Procedure Preparation included 5% betadine to ocular surface, eyelid speculum. A (33g) needle was used.   Injection:  2 mg aflibercept Gretta Cool) SOLN   NDC: L6038910, Lot: [id number], Expiration date: 09/30/2019   Route: Intravitreal, Site: Right Eye, Waste: 0.05 mL  Post-op Post injection exam found visual acuity of at least counting fingers. The patient tolerated the procedure well. There were no complications. The patient received written and verbal post procedure care education.                 ASSESSMENT/PLAN:     ICD-10-CM   1. Branch retinal vein occlusion of right eye with macular edema  H34.8310 Intravitreal Injection, Pharmacologic Agent - OD - Right Eye    aflibercept (EYLEA) SOLN 2 mg  2. Retinal edema  H35.81 OCT, Retina - OU - Both Eyes  3. Both eyes affected by mild nonproliferative diabetic retinopathy with macular edema, associated with type 2 diabetes mellitus (HCC)  G62.6948   4. Essential hypertension  I10   5. Hypertensive retinopathy of both eyes  H35.033   6. Epiretinal membrane (ERM) of right eye  H35.371   7. Pseudophakia of both eyes  Z96.1   8. 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 Floaters in visual field, right  H43.391     1,2. BRVO w/ CME OD  - by history, pt states symptoms first noticed 2 wks prior to presentation, but reports changes may have occurred prior  - initial exam with differential tortuosity of vessels (OD > OS)  - FA (02.10.20) shows mild late staining / leakage in macula, staining / leakage of disc -- improving CME  - differential includes DM2 (DME), hypertensive retinopathy, inflammatory etiology / uveitis  - S/P IVA OD #1 (02.08.19), #2 (03.11.19), #3 (04.09.19), #4 (05.20.19), #5 (02.10.20)  - gave IVA OD on 2.10.20 due to pending Eylea4U for 2020 -- resulted in increased IRF/CME  - review of OCTs show persistent IRF and cystic changes --  resistance to IVA   - June 2019 -- switched therapies: S/P IVE OD #1 (06.24.19), #2 (07.24.19), #3 (09.04.19), #4 (10.30.19),#5 (12.30.19), #6 (03.23.20), #7 (05.05.20), #8 (07.16.20), #9 (07.17.20), #10 (08.28.20), #11 (10.13.20), # 12 (11.17.20), #13 (2.8.21), #14 (03.09.21), #15 (4.13.21)  - OCT today shows mild interval increase in IRF/cystic changes  - BCVA improved to 20/30 from 20/40  - Eylea4U benefits investigation for 2021 now completed and pt approved for IVE  - recommend IVE OD #16 today, 5.11.21  - RBA of procedure discussed, questions answered  - informed consent obtained  - see procedure note  -  Eylea informed consent form obtained and scanned on 11.19.2020  - F/U 5 weeks  -- DFE/OCT/possible injection  3. Mild nonproliferative diabetic retinopathy, both eyes  - The incidence, risk factors for progression, natural history and treatment options for diabetic retinopathy were discussed with patient.    - The need for close monitoring of blood glucose, blood pressure, and serum lipids, avoiding cigarette or any type of tobacco, and the need for long term follow up was also discussed with patient.  - could be contributing to CME OD  - OS with minimal diabetic  retinopathy  - continue to monitor  4,5. Hypertensive retinopathy OU - stable  - as above, may have contributing to CME OD  - discussed importance of tight BP control  - monitor  6. Epiretinal membrane, right eye   - stable nasal ERM  - no indication for surgery at this time  7. Pseudophakia OU  - s/p CE/IOL OU [facility] in [facility]  - doing well  - monitor  8. Optic disc edema OD -- sectoral  - likely secondary to BRVO but differential includes carotid stenosis and retro-orbital mass  - history of blood clots  - recommend CT orbits w/ contrast to r/o retro-orbital mass -- not obtained  - recommend carotid dopplers to r/o stenosis / occlusion -- pt scheduled for repeat u/s due to recent bilateral femoral endarterectomies -- strong history of peripheral vascular disease   Ophthalmic Meds Ordered this visit:  Meds ordered this encounter  Medications  . aflibercept (EYLEA) SOLN 2 mg       Return in about 5 weeks (around 06/18/2019) for DFE, OCT.  There are no Patient Instructions on file for this visit.   Explained the diagnoses, plan, and follow up with the patient and they expressed understanding.  Patient expressed understanding of the importance of proper follow up care.   This document serves as a record of services personally performed by Karie Chimera, MD, PhD. It was created on their behalf by Cristopher Estimable, COT an ophthalmic technician. The creation of this record is the provider's dictation and/or activities during the visit.    Electronically signed by: Cristopher Estimable, COT 05/14/19 @ 11:13 PM  Karie Chimera, M.D., Ph.D. Diseases & Surgery of the Retina and Vitreous [facility] 05/14/2019   I have reviewed the above documentation for accuracy and completeness, and I agree with the above. Karie Chimera, M.D., Ph.D. 05/14/19 11:13 PM   Abbreviations: M myopia (nearsighted); A astigmatism; H hyperopia (farsighted); P presbyopia; Mrx spectacle prescription;  CTL contact lenses; OD right eye; OS left eye; OU both eyes  XT exotropia; ET esotropia; PEK punctate epithelial keratitis; PEE punctate epithelial erosions; DES dry eye syndrome; MGD meibomian gland dysfunction; ATs artificial tears; PFAT's preservative free artificial tears; NSC nuclear sclerotic cataract; PSC posterior subcapsular cataract; ERM epi-retinal membrane; PVD posterior vitreous detachment; RD retinal detachment; DM diabetes mellitus; DR diabetic retinopathy; NPDR non-proliferative diabetic retinopathy; PDR proliferative diabetic retinopathy; CSME clinically significant macular edema; DME diabetic macular edema; dbh dot blot hemorrhages; CWS cotton wool spot; POAG primary open angle glaucoma; C/D cup-to-disc ratio; HVF humphrey visual field; GVF goldmann visual field; OCT optical coherence tomography; IOP intraocular pressure; BRVO Branch retinal vein occlusion; CRVO central retinal vein occlusion; CRAO central retinal artery occlusion; BRAO branch retinal artery occlusion; RT retinal tear; SB scleral buckle; PPV pars plana vitrectomy; VH Vitreous hemorrhage; PRP panretinal laser photocoagulation; IVK intravitreal kenalog; VMT vitreomacular traction; MH Macular hole;  NVD neovascularization of the disc; NVE neovascularization elsewhere; AREDS age related eye disease study; ARMD age related macular  degeneration; POAG primary open angle glaucoma; EBMD epithelial/anterior basement membrane dystrophy; ACIOL anterior chamber intraocular lens; IOL intraocular lens; PCIOL posterior chamber intraocular lens; Phaco/IOL phacoemulsification with intraocular lens placement; PRK photorefractive keratectomy; LASIK laser assisted in situ keratomileusis; HTN hypertension; DM diabetes mellitus; COPD chronic obstructive pulmonary disease

## 2019-06-05 ENCOUNTER — Inpatient Hospital Stay: Payer: Medicare Other

## 2019-06-05 ENCOUNTER — Inpatient Hospital Stay: Payer: Medicare Other | Attending: Internal Medicine

## 2019-06-05 ENCOUNTER — Inpatient Hospital Stay (HOSPITAL_BASED_OUTPATIENT_CLINIC_OR_DEPARTMENT_OTHER): Payer: Medicare Other | Admitting: Internal Medicine

## 2019-06-05 ENCOUNTER — Other Ambulatory Visit: Payer: Self-pay

## 2019-06-05 VITALS — BP 144/76 | HR 83

## 2019-06-05 DIAGNOSIS — D649 Anemia, unspecified: Secondary | ICD-10-CM

## 2019-06-05 DIAGNOSIS — I7025 Atherosclerosis of native arteries of other extremities with ulceration: Secondary | ICD-10-CM

## 2019-06-05 DIAGNOSIS — D631 Anemia in chronic kidney disease: Secondary | ICD-10-CM

## 2019-06-05 DIAGNOSIS — Z79899 Other long term (current) drug therapy: Secondary | ICD-10-CM | POA: Diagnosis not present

## 2019-06-05 DIAGNOSIS — D509 Iron deficiency anemia, unspecified: Secondary | ICD-10-CM | POA: Diagnosis present

## 2019-06-05 LAB — CBC WITH DIFFERENTIAL/PLATELET
Abs Immature Granulocytes: 0.02 10*3/uL (ref 0.00–0.07)
Basophils Absolute: 0 10*3/uL (ref 0.0–0.1)
Basophils Relative: 1 %
Eosinophils Absolute: 0.1 10*3/uL (ref 0.0–0.5)
Eosinophils Relative: 2 %
HCT: 30.1 % — ABNORMAL LOW (ref 36.0–46.0)
Hemoglobin: 9.9 g/dL — ABNORMAL LOW (ref 12.0–15.0)
Immature Granulocytes: 0 %
Lymphocytes Relative: 22 %
Lymphs Abs: 1.3 10*3/uL (ref 0.7–4.0)
MCH: 28.1 pg (ref 26.0–34.0)
MCHC: 32.9 g/dL (ref 30.0–36.0)
MCV: 85.5 fL (ref 80.0–100.0)
Monocytes Absolute: 0.6 10*3/uL (ref 0.1–1.0)
Monocytes Relative: 10 %
Neutro Abs: 4 10*3/uL (ref 1.7–7.7)
Neutrophils Relative %: 65 %
Platelets: 311 10*3/uL (ref 150–400)
RBC: 3.52 MIL/uL — ABNORMAL LOW (ref 3.87–5.11)
RDW: 14.3 % (ref 11.5–15.5)
WBC: 6 10*3/uL (ref 4.0–10.5)
nRBC: 0 % (ref 0.0–0.2)

## 2019-06-05 LAB — BASIC METABOLIC PANEL
Anion gap: 11 (ref 5–15)
BUN: 26 mg/dL — ABNORMAL HIGH (ref 8–23)
CO2: 26 mmol/L (ref 22–32)
Calcium: 9.8 mg/dL (ref 8.9–10.3)
Chloride: 104 mmol/L (ref 98–111)
Creatinine, Ser: 1.13 mg/dL — ABNORMAL HIGH (ref 0.44–1.00)
GFR calc Af Amer: 55 mL/min — ABNORMAL LOW (ref >60)
GFR calc non Af Amer: 48 mL/min — ABNORMAL LOW (ref >60)
Glucose, Bld: 202 mg/dL — ABNORMAL HIGH (ref 70–99)
Potassium: 4.4 mmol/L (ref 3.5–5.1)
Sodium: 141 mmol/L (ref 135–145)

## 2019-06-05 MED ORDER — IRON SUCROSE 20 MG/ML IV SOLN
200.0000 mg | Freq: Once | INTRAVENOUS | Status: AC
  Administered 2019-06-05: 200 mg via INTRAVENOUS
  Filled 2019-06-05: qty 10

## 2019-06-05 MED ORDER — SODIUM CHLORIDE 0.9 % IV SOLN
Freq: Once | INTRAVENOUS | Status: AC
  Filled 2019-06-05: qty 250

## 2019-06-05 NOTE — Progress Notes (Signed)
[facility] NOTE  Patient Care Team: Rusty Aus, MD as PCP - General (Internal Medicine) Josefine Class, MD as Referring Physician (Gastroenterology) Cammie Sickle, MD as Consulting Physician (Hematology and Oncology)  CHIEF COMPLAINTS/PURPOSE OF CONSULTATION: Anemia   HEMATOLOGY HISTORY  # ANEMIA- Jan 2021- 8.8/ferritin 11 [PCP]; N-WBC/platelets? IDA vs other- EGD-2015/colonoscopy-? 2015; 2020- [Dr.Skulskie] ; capsule-2016- ? 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  female anemia iron deficiency-question CKD-III s here for follow-up.  In the interim patient had EGD colonoscopy-no reason for anemia noted.  Patient noted to have improvement of energy levels post IV iron infusion.  Complaints of mild fatigue at this time.    No nausea no vomiting.  No headaches.  No blood in stools or black or stools.  Review of Systems  Constitutional: Positive for malaise/fatigue. Negative for chills, diaphoresis and fever.  HENT: Negative for nosebleeds and sore throat.   Eyes: Negative for double vision.  Respiratory: Positive for shortness of breath. Negative for cough, hemoptysis, sputum production and wheezing.   Cardiovascular: Negative for chest pain, palpitations, orthopnea and leg swelling.  Gastrointestinal: Negative for abdominal pain, blood in stool, constipation, diarrhea, heartburn, melena, nausea and vomiting.  Genitourinary: Negative for dysuria, frequency and urgency.  Musculoskeletal: Positive for joint pain.  Skin: Negative.  Negative for itching and rash.  Neurological: Negative for dizziness, tingling, focal weakness, weakness and headaches.  Endo/Heme/Allergies: Does not bruise/bleed easily.  Psychiatric/Behavioral: Negative for depression. The patient is not nervous/anxious and does  not have insomnia.     MEDICAL HISTORY:  Past Medical History:  Diagnosis Date  . Anemia   . Anxiety   . Arthritis    Gout  . Cataracts, both eyes   . Diabetic retinopathy ([place])    NPDR OU  . Diabetic retinopathy ([place])   . GERD (gastroesophageal reflux disease)   . Gout   . Headache    h/o migraines  . History of fracture of patella    right knee  . History of positive PPD    Patient always shows positive  . Hyperlipidemia   . Hypertension   . Hypertensive retinopathy    OU  . Hypothyroidism   . Lichen sclerosus 56/21/3086   of vulva  . Metatarsal fracture   . Neuropathy   . Peripheral vascular disease ([place])   . Polyneuropathy    numbness and tingling in feet and toes  . Renal insufficiency    Stage 3  . Sleep apnea    does not use cpap-lost weight   . Type 2 diabetes mellitus, uncontrolled ([place])     SURGICAL HISTORY: Past Surgical History:  Procedure Laterality Date  . ABDOMINAL HYSTERECTOMY    . APPENDECTOMY    . BREAST REDUCTION SURGERY  2001  . CATARACT EXTRACTION    . CESAREAN SECTION  1976  . COLONOSCOPY  03/05/2013   Nml - due for repeat 03/06/2018  . COLONOSCOPY WITH PROPOFOL N/A 03/18/2019   Procedure: COLONOSCOPY WITH PROPOFOL;  Surgeon: Toledo, Benay Pike, MD;  Location: [facility];  Service: Gastroenterology;  Laterality: N/A;  . DIAGNOSTIC LAPAROSCOPY    . DILATION AND CURETTAGE OF UTERUS  1989  . ENDARTERECTOMY FEMORAL Bilateral 03/09/2018   Procedure: ENDARTERECTOMY FEMORAL;  Surgeon: Algernon Huxley, MD;  Location: [facility];  Service: Vascular;  Laterality:  Bilateral;  . ENDARTERECTOMY POPLITEAL Left 03/09/2018   Procedure: ENDARTERECTOMY POPLITEAL AND SFA;  Surgeon: Algernon Huxley, MD;  Location: [facility];  Service: Vascular;  Laterality: Left;  . ESOPHAGOGASTRODUODENOSCOPY  03/05/2013  . ESOPHAGOGASTRODUODENOSCOPY (EGD) WITH PROPOFOL N/A 03/18/2019   Procedure: ESOPHAGOGASTRODUODENOSCOPY (EGD) WITH PROPOFOL;  Surgeon: Toledo, Benay Pike, MD;  Location:  [facility];  Service: Gastroenterology;  Laterality: N/A;  . EYE SURGERY    . Eyelid Surgery  2012  . INTRAMEDULLARY (IM) NAIL INTERTROCHANTERIC Left 10/30/2015   Procedure: INTRAMEDULLARY (IM) NAIL INTERTROCHANTRIC ;  Surgeon: Hessie Knows, MD;  Location: [facility];  Service: Orthopedics;  Laterality: Left;  . KYPHOPLASTY N/A 10/25/2018   Procedure: L4 KYPHOPLASTY;  Surgeon: Hessie Knows, MD;  Location: [facility];  Service: Orthopedics;  Laterality: N/A;  . LAPAROSCOPIC HYSTERECTOMY  2000   total  . LOWER EXTREMITY ANGIOGRAPHY Left 03/08/2017   Procedure: LOWER EXTREMITY ANGIOGRAPHY;  Surgeon: Algernon Huxley, MD;  Location: [place] [facility];  Service: Cardiovascular;  Laterality: Left;  . LOWER EXTREMITY ANGIOGRAPHY Left 10/30/2017   Procedure: LOWER EXTREMITY ANGIOGRAPHY;  Surgeon: Algernon Huxley, MD;  Location: [place] [facility];  Service: Cardiovascular;  Laterality: Left;  . LOWER EXTREMITY ANGIOGRAPHY Right 03/08/2018   Procedure: LOWER EXTREMITY ANGIOGRAPHY;  Surgeon: Algernon Huxley, MD;  Location: [place] [facility];  Service: Cardiovascular;  Laterality: Right;  . LOWER EXTREMITY ANGIOGRAPHY Left 10/01/2018   Procedure: LOWER EXTREMITY ANGIOGRAPHY;  Surgeon: Algernon Huxley, MD;  Location: [place] [facility];  Service: Cardiovascular;  Laterality: Left;  . LOWER EXTREMITY ANGIOGRAPHY Right 10/08/2018   Procedure: LOWER EXTREMITY ANGIOGRAPHY;  Surgeon: Algernon Huxley, MD;  Location: [place] [facility];  Service: Cardiovascular;  Laterality: Right;  . PERIPHERAL VASCULAR INTERVENTION  03/08/2018   Procedure: PERIPHERAL VASCULAR INTERVENTION;  Surgeon: Algernon Huxley, MD;  Location: [place] [facility];  Service: Cardiovascular;;  . REDUCTION MAMMAPLASTY  1997  . SACROPLASTY N/A 10/25/2018   Procedure: S1 SACROPLASTY;  Surgeon: Hessie Knows, MD;  Location: [facility];  Service: Orthopedics;  Laterality: N/A;    SOCIAL HISTORY: Social History   Socioeconomic History  . Marital status:  Married    Spouse name: Jenny Reichmann  . Number of children: 3  . Years of education: Not on file  . Highest education level: Not on file  Occupational History  . Occupation: Retail banker  Tobacco Use  . Smoking status: Former Smoker    Packs/day: 1.00    Years: 20.00    Pack years: 20.00    Types: Cigarettes    Quit date: 03/07/1996    Years since quitting: 23.2  . Smokeless tobacco: Never Used  . Tobacco comment: started smoking at age 69  Substance and Sexual Activity  . Alcohol use: No    Alcohol/week: 0.0 standard drinks  . Drug use: No  . Sexual activity: Yes    Partners: Male    Birth control/protection: Surgical  Other Topics Concern  . Not on file  Social History Narrative   Lives in [place]; with husband; quit > 20 years; no alcohol; used to work at [employer] at [employer].    Social Determinants of Health   Financial Resource Strain: Low Risk   . Difficulty of Paying Living Expenses: Not very hard  Food Insecurity: No Food Insecurity  . Worried About Charity fundraiser in the Last Year: Never true  . Ran Out of Food in the Last Year: Never true  Transportation Needs:  Unknown  . Lack of Transportation (Medical): No  . Lack of Transportation (Non-Medical): Not on file  Physical Activity: Unknown  . Days of Exercise per Week: 2 days  . Minutes of Exercise per Session: Not on file  Stress: Stress Concern Present  . Feeling of Stress : To some extent  Social Connections: Unknown  . Frequency of Communication with Friends and Family: More than three times a week  . Frequency of Social Gatherings with Friends and Family: Not on file  . Attends Religious Services: Not on file  . Active Member of Clubs or Organizations: Not on file  . Attends Archivist Meetings: Not on file  . Marital Status: Married  Human resources officer Violence: Not At Risk  . Fear of Current or Ex-Partner: No  . Emotionally Abused: No  . Physically Abused: No  . Sexually Abused: No     FAMILY HISTORY: Family History  Problem Relation Age of Onset  . Coronary artery disease Father   . Heart attack Father   . Coronary artery disease Mother   . Heart attack Mother   . Ovarian cancer Sister 85       sister had hormonal therapy for IVF txs-which increased risk factor for ovarian cancer  . Breast cancer Neg Hx     ALLERGIES:  has No Known Allergies.  MEDICATIONS:  Current Outpatient Medications  Medication Sig Dispense Refill  . acetaminophen (TYLENOL) 500 MG tablet Take 1,000 mg by mouth every 6 (six) hours as needed for moderate pain or headache.    . Aflibercept (EYLEA) 2 MG/0.05ML SOLN 2 mg by Intravitreal route every 5 (five) weeks.     . ALPRAZolam (XANAX) 0.25 MG tablet Take 0.25 mg by mouth daily as needed for anxiety or sleep.     [name] amLODipine (NORVASC) 5 MG tablet Take by mouth.    . Ascorbic Acid (VITAMIN C) 1000 MG tablet Take 1,000 mg by mouth daily.    [name] aspirin EC 81 MG tablet Take 81 mg by mouth daily.     . calcium carbonate (TUMS EX) 750 MG chewable tablet Chew 2,250 mg by mouth daily as needed for heartburn.    . chlorhexidine (PERIDEX) 0.12 % solution Use as directed 15 mLs in the mouth or throat 2 (two) times daily.   0  . cholecalciferol (VITAMIN D) 1000 units tablet Take 1,000 Units by mouth 2 (two) times daily.    [name] CORAL CALCIUM PO Take 1 tablet by mouth 2 (two) times daily.     [name] denosumab (PROLIA) 60 MG/ML SOLN injection Inject 60 mg into the skin every 6 (six) months.     . DENTA 5000 PLUS 1.1 % CREA dental cream Place 1 application onto teeth 2 (two) times daily. as directed  99  . DULoxetine (CYMBALTA) 60 MG capsule Take 1 capsule (60 mg total) by mouth 2 (two) times daily.  3  . ELIQUIS 5 MG TABS tablet TAKE 1 TABLET BY MOUTH TWICE A DAY 60 tablet 2  . famotidine (PEPCID) 40 MG tablet Take 40 mg by mouth 2 (two) times daily.     . ferrous gluconate (FERGON) 324 MG tablet Take 324 mg by mouth daily with breakfast.    . gabapentin  (NEURONTIN) 100 MG capsule Take 1 capsule by mouth daily.    . Insulin Pen Needle (B-D UF III MINI PEN NEEDLES) 31G X 5 MM MISC once daily.    [name] levothyroxine (SYNTHROID, LEVOTHROID) 100 MCG tablet Take  100 mcg by mouth daily before breakfast.   3  . lisinopril (PRINIVIL,ZESTRIL) 10 MG tablet Take 10 mg by mouth every morning.     . Magnesium 500 MG TABS Take 500 mg by mouth every morning.     . metoprolol succinate (TOPROL-XL) 50 MG 24 hr tablet Take by mouth.    . mirtazapine (REMERON) 15 MG tablet Take by mouth.    . Multiple Vitamin (MULTIVITAMIN WITH MINERALS) TABS tablet Take 1 tablet by mouth daily.    . pantoprazole (PROTONIX) 40 MG tablet Take 40 mg by mouth every morning.     . traMADol (ULTRAM) 50 MG tablet Take 1 tablet (50 mg total) by mouth every 6 (six) hours as needed. 20 tablet 0  . TRESIBA FLEXTOUCH 200 UNIT/ML SOPN Inject 30 Units as directed at bedtime.   5  . TYMLOS 3120 MCG/1.56ML SOPN     . VASCEPA 1 g CAPS Take 1 capsule by mouth 2 (two) times daily.    . vitamin B-12 (CYANOCOBALAMIN) 1000 MCG tablet Take 1,000 mcg by mouth daily.    . vitamin E 400 UNIT capsule Take 400 Units by mouth daily.    [name] zolpidem (AMBIEN) 5 MG tablet Take 1 tablet (5 mg total) by mouth at bedtime. (Patient taking differently: Take 5 mg by mouth at bedtime as needed. ) 30 tablet 0  . buPROPion (WELLBUTRIN XL) 150 MG 24 hr tablet Take 150 mg by mouth daily.     [name] buPROPion (WELLBUTRIN XL) 300 MG 24 hr tablet Take 300 mg by mouth daily.    . furosemide (LASIX) 20 MG tablet Take 20 mg by mouth daily as needed for fluid or edema.     . rosuvastatin (CRESTOR) 20 MG tablet Take 20 mg by mouth every morning.      Current Facility-Administered Medications  Medication Dose Route Frequency Provider Last Rate Last Admin  . aflibercept (EYLEA) SOLN 2 mg  2 mg Intravitreal  Bernarda Caffey, MD   2 mg at 06/27/17 1307  . aflibercept (EYLEA) SOLN 2 mg  2 mg Intravitreal  Bernarda Caffey, MD   2 mg at 07/26/17  1702  . aflibercept (EYLEA) SOLN 2 mg  2 mg Intravitreal  Bernarda Caffey, MD   2 mg at 09/06/17 1700  . aflibercept (EYLEA) SOLN 2 mg  2 mg Intravitreal  Bernarda Caffey, MD   2 mg at 11/01/17 1550  . aflibercept (EYLEA) SOLN 2 mg  2 mg Intravitreal  Bernarda Caffey, MD   2 mg at 01/03/18 1416  . Bevacizumab (AVASTIN) SOLN 1.25 mg  1.25 mg Intravitreal  Bernarda Caffey, MD   1.25 mg at 02/10/17 0953  . Bevacizumab (AVASTIN) SOLN 1.25 mg  1.25 mg Intravitreal  Bernarda Caffey, MD   1.25 mg at 03/13/17 1717  . Bevacizumab (AVASTIN) SOLN 1.25 mg  1.25 mg Intravitreal  Bernarda Caffey, MD   1.25 mg at 04/11/17 1626  . Bevacizumab (AVASTIN) SOLN 1.25 mg  1.25 mg Intravitreal  Bernarda Caffey, MD   1.25 mg at 02/12/18 2154  . insulin aspart (novoLOG) injection 0-15 Units  0-15 Units Subcutaneous TID WC [name], [name], PA-C      . insulin aspart (novoLOG) injection 0-5 Units  0-5 Units Subcutaneous QHS [name], [name], PA-C          PHYSICAL EXAMINATION:   Vitals:   06/05/19 1346  BP: (!) 154/64  Pulse: 89  Resp: 18  Temp: (!) 95.5 F (35.3 C)  SpO2: 100%   Filed Weights   06/05/19 1346  Weight: 169 lb 6.4 oz (76.8 kg)    Physical Exam  Constitutional: She is oriented to person, place, and time and well-developed, well-nourished, and in no distress.  HENT:  Head: Normocephalic and atraumatic.  Mouth/Throat: Oropharynx is clear and moist. No oropharyngeal exudate.  Eyes: Pupils are equal, round, and reactive to light.  Cardiovascular: Normal rate and regular rhythm.  Pulmonary/Chest: Effort normal and breath sounds normal. No respiratory distress. She has no wheezes.  Abdominal: Soft. Bowel sounds are normal. She exhibits no distension and no mass. There is no abdominal tenderness. There is no rebound and no guarding.  Musculoskeletal:        General: No tenderness or edema. Normal range of motion.     Cervical back: Normal range of motion and neck supple.  Neurological: She is  alert and oriented to person, place, and time.  Skin: Skin is warm.  Psychiatric: Affect normal.    LABORATORY DATA:  I have reviewed the data as listed Lab Results  Component Value Date   WBC 6.0 06/05/2019   HGB 9.9 (L) 06/05/2019   HCT 30.1 (L) 06/05/2019   MCV 85.5 06/05/2019   PLT 311 06/05/2019   Recent Labs    10/08/18 0811 02/01/19 1156 06/05/19 1304  NA  --  136 141  K  --  4.5 4.4  CL  --  104 104  CO2  --  22 26  GLUCOSE  --  323* 202*  BUN 24* 23 26*  CREATININE 1.68* 1.26* 1.13*  CALCIUM  --  9.2 9.8  GFRNONAA 30* 42* 48*  GFRAA 35* 49* 55*  PROT  --  7.2  --   ALBUMIN  --  3.9  --   AST  --  24  --   ALT  --  17  --   ALKPHOS  --  65  --   BILITOT  --  0.6  --      Intravitreal Injection, Pharmacologic Agent - OD - Right Eye  Result Date: 05/14/2019 Time Out 05/14/2019. 3:31 PM. Confirmed correct patient, procedure, site, and patient consented. Anesthesia Topical anesthesia was used. Anesthetic medications included Lidocaine 2%, Proparacaine 0.5%. Procedure Preparation included 5% betadine to ocular surface, eyelid speculum. A (33g) needle was used. Injection: 2 mg aflibercept Alfonse Flavors) SOLN   NDC: [id number], Lot: [id number], Expiration date: 09/30/2019   Route: Intravitreal, Site: Right Eye, Waste: 0.05 mL Post-op Post injection exam found visual acuity of at least counting fingers. The patient tolerated the procedure well. There were no complications. The patient received written and verbal post procedure care education.   OCT, Retina - OU - Both Eyes  Result Date: 05/14/2019 Right Eye Quality was good. Central Foveal Thickness: 369. Progression has worsened. Findings include abnormal foveal contour, epiretinal membrane, intraretinal fluid, no SRF (Mild interval improvement in  cystic changes IT to fovea). Left Eye Quality was good. Central Foveal Thickness: 278. Progression has been stable. Findings include normal foveal contour, no IRF, no SRF (Trace  ERM). Notes *Images captured and stored on drive Diagnosis / Impression: OD: mild interval  increase in cystic changes IT to fovea OS: NFP; no IRF/SRF--stable, trace ERM Clinical management: See below Abbreviations: NFP - Normal foveal profile. CME - cystoid macular edema. PED - pigment epithelial detachment. IRF - intraretinal fluid. SRF - subretinal fluid. EZ - ellipsoid zone. ERM - epiretinal membrane. ORA - outer retinal atrophy. ORT - outer retinal  tubulation. SRHM - subretinal hyper-reflective material    Normocytic anemia #Anemia hemoglobin around 9/likely secondary to CKD-III/iron deficiency.  Improved from 9.8 s/p IV infusions.  Proceed with Venofer today.  #Etiology-CKD-III; March 2021-EGD/colonoscopy [[facility]] -within normal limits.  # DISPOSITION: #  Venofer today;  # venofer - 1week # follow up 3 months- MD; labs- cbc/bmp/iron studies/ferritin; possible venofer- Dr.B   All questions were answered. The patient knows to call the clinic with any problems, questions or concerns.    Cammie Sickle, MD 06/05/2019 1:54 PM

## 2019-06-05 NOTE — Assessment & Plan Note (Addendum)
#  Anemia hemoglobin around 9/likely secondary to CKD-III/iron deficiency.  Improved from 9.8 s/p IV infusions.  Proceed with Venofer today.  #Etiology-CKD-III; March 2021-EGD/colonoscopy [[facility]] -within normal limits.  # DISPOSITION: #  Venofer today;  # venofer - 1week # follow up 3 months- MD; labs- cbc/bmp/iron studies/ferritin; possible venofer- Dr.B

## 2019-06-10 ENCOUNTER — Other Ambulatory Visit (INDEPENDENT_AMBULATORY_CARE_PROVIDER_SITE_OTHER): Payer: Self-pay | Admitting: Vascular Surgery

## 2019-06-12 ENCOUNTER — Other Ambulatory Visit: Payer: Self-pay

## 2019-06-12 ENCOUNTER — Inpatient Hospital Stay: Payer: Medicare Other

## 2019-06-12 VITALS — BP 141/75 | HR 73 | Temp 98.4°F | Resp 18

## 2019-06-12 DIAGNOSIS — D509 Iron deficiency anemia, unspecified: Secondary | ICD-10-CM | POA: Diagnosis not present

## 2019-06-12 DIAGNOSIS — D631 Anemia in chronic kidney disease: Secondary | ICD-10-CM

## 2019-06-12 MED ORDER — SODIUM CHLORIDE 0.9 % IV SOLN
Freq: Once | INTRAVENOUS | Status: AC
  Filled 2019-06-12: qty 250

## 2019-06-12 MED ORDER — IRON SUCROSE 20 MG/ML IV SOLN
200.0000 mg | Freq: Once | INTRAVENOUS | Status: AC
  Administered 2019-06-12: 200 mg via INTRAVENOUS
  Filled 2019-06-12: qty 10

## 2019-06-20 NOTE — Progress Notes (Signed)
[facility] - Clinic Note  06/21/2019     CHIEF COMPLAINT Patient presents for Retina Follow Up   HISTORY OF PRESENT ILLNESS: Cheryl Leonard is a 74 y.o. female who presents to the clinic today for:   HPI    Retina Follow Up    Patient presents with  CRVO/BRVO.  In right eye.  Severity is moderate.  Duration of 5 weeks.  Since onset it is stable.  I, the attending physician,  performed the HPI with the patient and updated documentation appropriately.          Comments    Patient states vision the same OU.       Last edited by [name], MD on 06/23/2019 12:41 AM. (History)      Referring physician: Danella Penton, MD [phone] [facility] [address] [facility] [place],  [place] [address]  HISTORICAL INFORMATION:   Selected notes from the MEDICAL RECORD NUMBER Referred by Dr. Senaida Ores for concern of DME OD Lab Results  Component Value Date   HGBA1C 5.9 (H) 10/29/2015       CURRENT MEDICATIONS: Current Outpatient Medications (Ophthalmic Drugs)  Medication Sig  . Aflibercept (EYLEA) 2 MG/0.05ML SOLN 2 mg by Intravitreal route every 5 (five) weeks.    Current Facility-Administered Medications (Ophthalmic Drugs)  Medication Route  . aflibercept (EYLEA) SOLN 2 mg Intravitreal  . aflibercept (EYLEA) SOLN 2 mg Intravitreal  . aflibercept (EYLEA) SOLN 2 mg Intravitreal  . aflibercept (EYLEA) SOLN 2 mg Intravitreal  . aflibercept (EYLEA) SOLN 2 mg Intravitreal   Current Outpatient Medications (Other)  Medication Sig  . acetaminophen (TYLENOL) 500 MG tablet Take 1,000 mg by mouth every 6 (six) hours as needed for moderate pain or headache.  . ALPRAZolam (XANAX) 0.25 MG tablet Take 0.25 mg by mouth daily as needed for anxiety or sleep.   [name] amLODipine (NORVASC) 5 MG tablet Take by mouth.  . Ascorbic Acid (VITAMIN C) 1000 MG tablet Take 1,000 mg by mouth daily.  [name] aspirin EC 81 MG tablet Take 81 mg by mouth daily.   [name] buPROPion  (WELLBUTRIN XL) 150 MG 24 hr tablet Take 150 mg by mouth daily.   [name] buPROPion (WELLBUTRIN XL) 300 MG 24 hr tablet Take 300 mg by mouth daily.  . calcium carbonate (TUMS EX) 750 MG chewable tablet Chew 2,250 mg by mouth daily as needed for heartburn.  . chlorhexidine (PERIDEX) 0.12 % solution Use as directed 15 mLs in the mouth or throat 2 (two) times daily.   . cholecalciferol (VITAMIN D) 1000 units tablet Take 1,000 Units by mouth 2 (two) times daily.  [name] CORAL CALCIUM PO Take 1 tablet by mouth 2 (two) times daily.   [name] denosumab (PROLIA) 60 MG/ML SOLN injection Inject 60 mg into the skin every 6 (six) months.   . DENTA 5000 PLUS 1.1 % CREA dental cream Place 1 application onto teeth 2 (two) times daily. as directed  . DULoxetine (CYMBALTA) 60 MG capsule Take 1 capsule (60 mg total) by mouth 2 (two) times daily.  [name] ELIQUIS 5 MG TABS tablet TAKE 1 TABLET BY MOUTH TWICE A DAY  . famotidine (PEPCID) 40 MG tablet Take 40 mg by mouth 2 (two) times daily.   . ferrous gluconate (FERGON) 324 MG tablet Take 324 mg by mouth daily with breakfast.  . gabapentin (NEURONTIN) 100 MG capsule Take 1 capsule by mouth daily.  . Insulin Pen Needle (B-D UF III MINI PEN  NEEDLES) 31G X 5 MM MISC once daily.  [name] levothyroxine (SYNTHROID, LEVOTHROID) 100 MCG tablet Take 100 mcg by mouth daily before breakfast.   . lisinopril (PRINIVIL,ZESTRIL) 10 MG tablet Take 10 mg by mouth every morning.   . Magnesium 500 MG TABS Take 500 mg by mouth every morning.   . metoprolol succinate (TOPROL-XL) 50 MG 24 hr tablet Take by mouth.  . mirtazapine (REMERON) 15 MG tablet Take by mouth.  . Multiple Vitamin (MULTIVITAMIN WITH MINERALS) TABS tablet Take 1 tablet by mouth daily.  . pantoprazole (PROTONIX) 40 MG tablet Take 40 mg by mouth every morning.   . traMADol (ULTRAM) 50 MG tablet Take 1 tablet (50 mg total) by mouth every 6 (six) hours as needed.  . TRESIBA FLEXTOUCH 200 UNIT/ML SOPN Inject 30 Units as directed at bedtime.   .  TYMLOS 3120 MCG/1.56ML SOPN   . VASCEPA 1 g CAPS Take 1 capsule by mouth 2 (two) times daily.  . vitamin B-12 (CYANOCOBALAMIN) 1000 MCG tablet Take 1,000 mcg by mouth daily.  . vitamin E 400 UNIT capsule Take 400 Units by mouth daily.  [name] zolpidem (AMBIEN) 5 MG tablet Take 1 tablet (5 mg total) by mouth at bedtime. (Patient taking differently: Take 5 mg by mouth at bedtime as needed. )  . furosemide (LASIX) 20 MG tablet Take 20 mg by mouth daily as needed for fluid or edema.   . rosuvastatin (CRESTOR) 20 MG tablet Take 20 mg by mouth every morning.    Current Facility-Administered Medications (Other)  Medication Route  . Bevacizumab (AVASTIN) SOLN 1.25 mg Intravitreal  . Bevacizumab (AVASTIN) SOLN 1.25 mg Intravitreal  . Bevacizumab (AVASTIN) SOLN 1.25 mg Intravitreal  . Bevacizumab (AVASTIN) SOLN 1.25 mg Intravitreal  . insulin aspart (novoLOG) injection 0-15 Units Subcutaneous  . insulin aspart (novoLOG) injection 0-5 Units Subcutaneous      REVIEW OF SYSTEMS: ROS    Positive for: Musculoskeletal, Endocrine, Cardiovascular, Eyes   Negative for: Constitutional, Gastrointestinal, Neurological, Skin, Genitourinary, HENT, Respiratory, Psychiatric, Allergic/Imm, Heme/Lymph   Last edited by Annalee Genta D, COT on 06/21/2019  2:12 PM. (History)       ALLERGIES No Known Allergies  PAST MEDICAL HISTORY Past Medical History:  Diagnosis Date  . Anemia   . Anxiety   . Arthritis    Gout  . Cataracts, both eyes   . Diabetic retinopathy (HCC)    NPDR OU  . Diabetic retinopathy (HCC)   . GERD (gastroesophageal reflux disease)   . Gout   . Headache    h/o migraines  . History of fracture of patella    right knee  . History of positive PPD    Patient always shows positive  . Hyperlipidemia   . Hypertension   . Hypertensive retinopathy    OU  . Hypothyroidism   . Lichen sclerosus 12/30/2013   of vulva  . Metatarsal fracture   . Neuropathy   . Peripheral vascular disease  (HCC)   . Polyneuropathy    numbness and tingling in feet and toes  . Renal insufficiency    Stage 3  . Sleep apnea    does not use cpap-lost weight   . Type 2 diabetes mellitus, uncontrolled (HCC)    Past Surgical History:  Procedure Laterality Date  . ABDOMINAL HYSTERECTOMY    . APPENDECTOMY    . BREAST REDUCTION SURGERY  2001  . CATARACT EXTRACTION    . CESAREAN SECTION  1976  . COLONOSCOPY  03/05/2013  Nml - due for repeat 03/06/2018  . COLONOSCOPY WITH PROPOFOL N/A 03/18/2019   Procedure: COLONOSCOPY WITH PROPOFOL;  Surgeon: Toledo, Boykin Nearingeodoro K, MD;  Location: [facility];  Service: Gastroenterology;  Laterality: N/A;  . DIAGNOSTIC LAPAROSCOPY    . DILATION AND CURETTAGE OF UTERUS  1989  . ENDARTERECTOMY FEMORAL Bilateral 03/09/2018   Procedure: ENDARTERECTOMY FEMORAL;  Surgeon: Annice Needyew, Jason S, MD;  Location: [facility] ORS;  Service: Vascular;  Laterality: Bilateral;  . ENDARTERECTOMY POPLITEAL Left 03/09/2018   Procedure: ENDARTERECTOMY POPLITEAL AND SFA;  Surgeon: Annice Needyew, Jason S, MD;  Location: [facility] ORS;  Service: Vascular;  Laterality: Left;  . ESOPHAGOGASTRODUODENOSCOPY  03/05/2013  . ESOPHAGOGASTRODUODENOSCOPY (EGD) WITH PROPOFOL N/A 03/18/2019   Procedure: ESOPHAGOGASTRODUODENOSCOPY (EGD) WITH PROPOFOL;  Surgeon: Toledo, Boykin Nearingeodoro K, MD;  Location: [facility];  Service: Gastroenterology;  Laterality: N/A;  . EYE SURGERY    . Eyelid Surgery  2012  . INTRAMEDULLARY (IM) NAIL INTERTROCHANTERIC Left 10/30/2015   Procedure: INTRAMEDULLARY (IM) NAIL INTERTROCHANTRIC ;  Surgeon: Kennedy BuckerMichael Menz, MD;  Location: [facility] OR[facility];  Service: Orthopedics;  Laterality: Left;  . KYPHOPLASTY N/A 10/25/2018   Procedure: L4 KYPHOPLASTY;  Surgeon: Kennedy BuckerMenz, Michael, MD;  Location: [facility] OR[facility];  Service: Orthopedics;  Laterality: N/A;  . LAPAROSCOPIC HYSTERECTOMY  2000   total  . LOWER EXTREMITY ANGIOGRAPHY Left 03/08/2017   Procedure: LOWER EXTREMITY ANGIOGRAPHY;  Surgeon: Annice Needyew, Jason S, MD;  Location: [facility];  Service: Cardiovascular;  Laterality: Left;  . LOWER EXTREMITY ANGIOGRAPHY Left 10/30/2017   Procedure: LOWER EXTREMITY ANGIOGRAPHY;  Surgeon: Annice Needyew, Jason S, MD;  Location: [facility];  Service: Cardiovascular;  Laterality: Left;  . LOWER EXTREMITY ANGIOGRAPHY Right 03/08/2018   Procedure: LOWER EXTREMITY ANGIOGRAPHY;  Surgeon: Annice Needyew, Jason S, MD;  Location: [facility];  Service: Cardiovascular;  Laterality: Right;  . LOWER EXTREMITY ANGIOGRAPHY Left 10/01/2018   Procedure: LOWER EXTREMITY ANGIOGRAPHY;  Surgeon: Annice Needyew, Jason S, MD;  Location: [facility];  Service: Cardiovascular;  Laterality: Left;  . LOWER EXTREMITY ANGIOGRAPHY Right 10/08/2018   Procedure: LOWER EXTREMITY ANGIOGRAPHY;  Surgeon: Annice Needyew, Jason S, MD;  Location: [facility];  Service: Cardiovascular;  Laterality: Right;  . PERIPHERAL VASCULAR INTERVENTION  03/08/2018   Procedure: PERIPHERAL VASCULAR INTERVENTION;  Surgeon: Annice Needyew, Jason S, MD;  Location: [facility];  Service: Cardiovascular;;  . REDUCTION MAMMAPLASTY  1997  . SACROPLASTY N/A 10/25/2018   Procedure: S1 SACROPLASTY;  Surgeon: Kennedy BuckerMenz, Michael, MD;  Location: [facility] OR[facility];  Service: Orthopedics;  Laterality: N/A;    FAMILY HISTORY Family History  Problem Relation Age of Onset  . Coronary artery disease Father   . Heart attack Father   . Coronary artery disease Mother   . Heart attack Mother   . Ovarian cancer Sister [id number]       sister had hormonal therapy for IVF txs-which increased risk factor for ovarian cancer  . Breast cancer Neg Hx     SOCIAL HISTORY Social History   Tobacco Use  . Smoking status: Former Smoker    Packs/day: 1.00    Years: 20.00    Pack years: 20.00    Types: Cigarettes    Quit date: 03/07/1996    Years since quitting: 23.3  . Smokeless tobacco: Never Used  . Tobacco comment: started smoking at age 74  Vaping Use  . Vaping Use: Never used  Substance Use Topics  . Alcohol use: No    Alcohol/week:  0.0 standard drinks  . Drug  use: No         OPHTHALMIC EXAM:  Base Eye Exam    Visual Acuity (Snellen - Linear)      Right Left   Dist Echo 20/40 -1 20/25 +1   Dist ph Hector 20/30 -1 20/25 +2       Tonometry (Tonopen, 2:22 PM)      Right Left   Pressure 14 17       Pupils      Dark Light Shape React APD   Right 3 2 Round Brisk None   Left 3 2 Round Brisk None       Visual Fields (Counting fingers)      Left Right    Full Full       Extraocular Movement      Right Left    Full, Ortho Full, Ortho       Neuro/Psych    Oriented x3: Yes   Mood/Affect: Normal       Dilation    Both eyes: 1.0% Mydriacyl, 2.5% Phenylephrine @ 2:22 PM        Slit Lamp and Fundus Exam    External Exam      Right Left   External Normal Normal       Slit Lamp Exam      Right Left   Lids/Lashes dermatochalasis dermatochalasis   Conjunctiva/Sclera White and quiet White and quiet   Cornea arcus; well healed cataract wound; 2-3+ diffuse Punctate epithelial erosions, decreased TBUT, mild Anterior basement membrane dystrophy superiorly arcus; well healed cataract wound, 2-3+ diffuse Punctate epithelial erosions, irregualr epi surface, decreased TBUT   Anterior Chamber Deep and quiet Deep and quiet   Iris Round and dilated Round and dilated   Lens PCIOL; open PC PCIOL; open PC   Vitreous syneresis, Posterior vitreous detachment, vitreous condensations inferiorly syneresis, Posterior vitreous detachment       Fundus Exam      Right Left   Disc Superior hyperemia and mild edema, +IRH superior disc - persistent, mild Pallor Pink and Sharp   C/D Ratio 0.5 0.5   Macula Flat, Blunted foveal reflex, persistent cystic changes, +Epiretinal membrane, no heme flat; blunted foveal reflex, no heme or edema, small pigment clump IT to fovea   Vessels Vascular attenuation, Tortuous Vascular attenuation   Periphery attached; MAs/DBH inferior--slightly improved Attached, no heme          IMAGING AND  PROCEDURES  Imaging and Procedures for 04/25/17  OCT, Retina - OU - Both Eyes       Right Eye Quality was good. Central Foveal Thickness: 378. Progression has been stable. Findings include abnormal foveal contour, epiretinal membrane, intraretinal fluid, no SRF (Persistent cystic changes IT to fovea).   Left Eye Quality was good. Central Foveal Thickness: 278. Progression has been stable. Findings include normal foveal contour, no IRF, no SRF (Trace ERM).   Notes *Images captured and stored on drive  Diagnosis / Impression:  OD: persistent cystic changes IT to fovea OS: NFP; no IRF/SRF--stable, trace ERM  Clinical management:  See below  Abbreviations: NFP - Normal foveal profile. CME - cystoid macular edema. PED - pigment epithelial detachment. IRF - intraretinal fluid. SRF - subretinal fluid. EZ - ellipsoid zone. ERM - epiretinal membrane. ORA - outer retinal atrophy. ORT - outer retinal tubulation. SRHM - subretinal hyper-reflective material        Intravitreal Injection, Pharmacologic Agent - OD - Right Eye       Time Out 06/21/2019.  4:00 PM. Confirmed correct patient, procedure, site, and patient consented.   Anesthesia Topical anesthesia was used. Anesthetic medications included Lidocaine 2%, Proparacaine 0.5%.   Procedure Preparation included 5% betadine to ocular surface, eyelid speculum. A (32g) needle was used.   Injection:  2 mg aflibercept Alfonse Flavors) SOLN   NDC: A3590391, Lot: [id number], Expiration date: 09/30/2019   Route: Intravitreal, Site: Right Eye, Waste: 0.05 mL  Post-op Post injection exam found visual acuity of at least counting fingers. The patient tolerated the procedure well. There were no complications. The patient received written and verbal post procedure care education.                 ASSESSMENT/PLAN:    ICD-10-CM   1. Branch retinal vein occlusion of right eye with macular edema  H34.8310 Intravitreal Injection, Pharmacologic  Agent - OD - Right Eye    aflibercept (EYLEA) SOLN 2 mg  2. Retinal edema  H35.81 OCT, Retina - OU - Both Eyes  3. Both eyes affected by mild nonproliferative diabetic retinopathy with macular edema, associated with type 2 diabetes mellitus ([place])  W29.5621   4. Essential hypertension  I10   5. Hypertensive retinopathy of both eyes  H35.033   6. Epiretinal membrane (ERM) of right eye  H35.371   7. Pseudophakia of both eyes  Z96.1   8. 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 Floaters in visual field, right  H43.391     1,2. BRVO w/ CME OD  - by history, pt states symptoms first noticed 2 wks prior to presentation, but reports changes may have occurred prior  - initial exam with differential tortuosity of vessels (OD > OS)  - FA (02.10.20) shows mild late staining / leakage in macula, staining / leakage of disc -- improving CME  - differential includes DM2 (DME), hypertensive retinopathy, inflammatory etiology / uveitis  - S/P IVA OD #1 (02.08.19), #2 (03.11.19), #3 (04.09.19), #4 (05.20.19), #5 (02.10.20)  - gave IVA OD on 2.10.20 due to pending Eylea4U for 2020 -- resulted in increased IRF/CME  - review of OCTs show persistent IRF and cystic changes --  resistance to IVA   - June 2019 -- switched therapies: S/P IVE OD #1 (06.24.19), #2 (07.24.19), #3 (09.04.19), #4 (10.30.19),#5 (12.30.19), #6 (03.23.20), #7 (05.05.20), #8 (07.16.20), #9 (07.17.20), #10 (08.28.20), #11 (10.13.20), # 12 (11.17.20), #13 (2.8.21), #14 (03.09.21), #15 (04.13.21), #16 (05.11.21)  - OCT today shows persistent IRF/cystic changes  - BCVA stable at 20/30-2  - Eylea4U benefits investigation for 2021 now completed and pt approved for IVE  - recommend IVE OD #17 today, 06.17.21  - RBA of procedure discussed, questions answered  - informed consent obtained  - see procedure note  - Eylea informed consent form obtained and scanned on 11.19.2020  - F/U 5 weeks  -- DFE/OCT/possible injection  3. Mild  nonproliferative diabetic retinopathy, both eyes  - The incidence, risk factors for progression, natural history and treatment options for diabetic retinopathy were discussed with patient.    - The need for close monitoring of blood glucose, blood pressure, and serum lipids, avoiding cigarette or any type of tobacco, and the need for long term follow up was also discussed with patient.  - could be contributing to CME OD  - OS with minimal diabetic retinopathy  - continue to monitor  4,5. Hypertensive retinopathy OU - stable  - as above, may have contributing to CME OD  - discussed importance of tight BP  control  - monitor  6. Epiretinal membrane, right eye   - stable nasal ERM  - no indication for surgery at this time  7. Pseudophakia OU  - s/p CE/IOL OU by cataract surgeon in [facility]  - doing well  - monitor  8. Optic disc edema OD -- sectoral  - likely secondary to BRVO but differential includes carotid stenosis and retro-orbital mass  - history of blood clots  - recommend CT orbits w/ contrast to r/o retro-orbital mass -- not obtained  - recommend carotid dopplers to r/o stenosis / occlusion -- pt scheduled for repeat u/s due to recent bilateral femoral endarterectomies -- strong history of peripheral vascular disease   Ophthalmic Meds Ordered this visit:  Meds ordered this encounter  Medications  . aflibercept (EYLEA) SOLN 2 mg       Return 5 weeks, for DFE, OCT.  There are no Patient Instructions on file for this visit.   Explained the diagnoses, plan, and follow up with the patient and they expressed understanding.  Patient expressed understanding of the importance of proper follow up care.   This document serves as a record of services personally performed by Karie Chimera, MD, PhD. It was created on their behalf by Annalee Genta, COMT. The creation of this record is the provider's dictation and/or activities during the visit.  Electronically signed by: Annalee Genta,  COMT 06/23/19 12:44 AM  Karie Chimera, M.D., Ph.D. Diseases & Surgery of the Retina and Vitreous [facility]  I have reviewed the above documentation for accuracy and completeness, and I agree with the above. Karie Chimera, M.D., Ph.D. 06/23/19 12:44 AM    Abbreviations: M myopia (nearsighted); A astigmatism; H hyperopia (farsighted); P presbyopia; Mrx spectacle prescription;  CTL contact lenses; OD right eye; OS left eye; OU both eyes  XT exotropia; ET esotropia; PEK punctate epithelial keratitis; PEE punctate epithelial erosions; DES dry eye syndrome; MGD meibomian gland dysfunction; ATs artificial tears; PFAT's preservative free artificial tears; NSC nuclear sclerotic cataract; PSC posterior subcapsular cataract; ERM epi-retinal membrane; PVD posterior vitreous detachment; RD retinal detachment; DM diabetes mellitus; DR diabetic retinopathy; NPDR non-proliferative diabetic retinopathy; PDR proliferative diabetic retinopathy; CSME clinically significant macular edema; DME diabetic macular edema; dbh dot blot hemorrhages; CWS cotton wool spot; POAG primary open angle glaucoma; C/D cup-to-disc ratio; HVF humphrey visual field; GVF goldmann visual field; OCT optical coherence tomography; IOP intraocular pressure; BRVO Branch retinal vein occlusion; CRVO central retinal vein occlusion; CRAO central retinal artery occlusion; BRAO branch retinal artery occlusion; RT retinal tear; SB scleral buckle; PPV pars plana vitrectomy; VH Vitreous hemorrhage; PRP panretinal laser photocoagulation; IVK intravitreal kenalog; VMT vitreomacular traction; MH Macular hole;  NVD neovascularization of the disc; NVE neovascularization elsewhere; AREDS age related eye disease study; ARMD age related macular degeneration; POAG primary open angle glaucoma; EBMD epithelial/anterior basement membrane dystrophy; ACIOL anterior chamber intraocular lens; IOL intraocular lens; PCIOL posterior chamber  intraocular lens; Phaco/IOL phacoemulsification with intraocular lens placement; PRK photorefractive keratectomy; LASIK laser assisted in situ keratomileusis; HTN hypertension; DM diabetes mellitus; COPD chronic obstructive pulmonary disease

## 2019-06-21 ENCOUNTER — Ambulatory Visit (INDEPENDENT_AMBULATORY_CARE_PROVIDER_SITE_OTHER): Payer: Medicare Other | Admitting: Ophthalmology

## 2019-06-21 ENCOUNTER — Encounter (INDEPENDENT_AMBULATORY_CARE_PROVIDER_SITE_OTHER): Payer: Self-pay | Admitting: Ophthalmology

## 2019-06-21 ENCOUNTER — Other Ambulatory Visit: Payer: Self-pay

## 2019-06-21 DIAGNOSIS — E113213 Type 2 diabetes mellitus with mild nonproliferative diabetic retinopathy with macular edema, bilateral: Secondary | ICD-10-CM

## 2019-06-21 DIAGNOSIS — H34831 Tributary (branch) retinal vein occlusion, right eye, with macular edema: Secondary | ICD-10-CM

## 2019-06-21 DIAGNOSIS — H35033 Hypertensive retinopathy, bilateral: Secondary | ICD-10-CM

## 2019-06-21 DIAGNOSIS — H3581 Retinal edema: Secondary | ICD-10-CM

## 2019-06-21 DIAGNOSIS — H35371 Puckering of macula, right eye: Secondary | ICD-10-CM

## 2019-06-21 DIAGNOSIS — H4710 Unspecified papilledema: Secondary | ICD-10-CM

## 2019-06-21 DIAGNOSIS — H43391 Other vitreous opacities, right eye: Secondary | ICD-10-CM

## 2019-06-21 DIAGNOSIS — I10 Essential (primary) hypertension: Secondary | ICD-10-CM

## 2019-06-21 DIAGNOSIS — Z961 Presence of intraocular lens: Secondary | ICD-10-CM

## 2019-06-23 ENCOUNTER — Encounter (INDEPENDENT_AMBULATORY_CARE_PROVIDER_SITE_OTHER): Payer: Self-pay | Admitting: Ophthalmology

## 2019-06-23 MED ORDER — AFLIBERCEPT 2MG/0.05ML IZ SOLN FOR KALEIDOSCOPE
2.0000 mg | INTRAVITREAL | Status: AC | PRN
  Administered 2019-06-23: 2 mg via INTRAVITREAL

## 2019-07-12 ENCOUNTER — Encounter (INDEPENDENT_AMBULATORY_CARE_PROVIDER_SITE_OTHER): Payer: Medicare Other

## 2019-07-23 NOTE — Progress Notes (Addendum)
[facility] - Clinic Note  07/26/2019     CHIEF COMPLAINT Patient presents for Retina Follow Up   HISTORY OF PRESENT ILLNESS: Cheryl Leonard is a 74 y.o. female who presents to the clinic today for:   HPI    Retina Follow Up    Patient presents with  Diabetic Retinopathy.  In both eyes.  Duration of 5 weeks.  Since onset it is stable.  I, the attending physician,  performed the HPI with the patient and updated documentation appropriately.          Comments    5 week follow up BRVO OD- Doing well.  Vision appears stable OU since last visit.  Using an OTC drop OU qd. BS: 96 this am  A1C: 7       Last edited by [name], MD on 07/26/2019 11:25 PM. (History)    Patient states vision the same OU.  Referring physician: Danella Penton, MD [phone] [facility] [address] [facility] [place],  [place] [address]  HISTORICAL INFORMATION:   Selected notes from the MEDICAL RECORD NUMBER Referred by Dr. Senaida Ores for concern of DME OD Lab Results  Component Value Date   HGBA1C 5.9 (H) 10/29/2015       CURRENT MEDICATIONS: Current Outpatient Medications (Ophthalmic Drugs)  Medication Sig  . Aflibercept (EYLEA) 2 MG/0.05ML SOLN 2 mg by Intravitreal route every 5 (five) weeks.    Current Facility-Administered Medications (Ophthalmic Drugs)  Medication Route  . aflibercept (EYLEA) SOLN 2 mg Intravitreal  . aflibercept (EYLEA) SOLN 2 mg Intravitreal  . aflibercept (EYLEA) SOLN 2 mg Intravitreal  . aflibercept (EYLEA) SOLN 2 mg Intravitreal  . aflibercept (EYLEA) SOLN 2 mg Intravitreal   Current Outpatient Medications (Other)  Medication Sig  . acetaminophen (TYLENOL) 500 MG tablet Take 1,000 mg by mouth every 6 (six) hours as needed for moderate pain or headache.  . ALPRAZolam (XANAX) 0.25 MG tablet Take 0.25 mg by mouth daily as needed for anxiety or sleep.   [name] amLODipine (NORVASC) 5 MG tablet Take by mouth.  . Ascorbic Acid (VITAMIN  C) 1000 MG tablet Take 1,000 mg by mouth daily.  [name] aspirin EC 81 MG tablet Take 81 mg by mouth daily.   [name] buPROPion (WELLBUTRIN XL) 150 MG 24 hr tablet Take 150 mg by mouth daily.   [name] buPROPion (WELLBUTRIN XL) 300 MG 24 hr tablet Take 300 mg by mouth daily.  . calcium carbonate (TUMS EX) 750 MG chewable tablet Chew 2,250 mg by mouth daily as needed for heartburn.  . chlorhexidine (PERIDEX) 0.12 % solution Use as directed 15 mLs in the mouth or throat 2 (two) times daily.   . cholecalciferol (VITAMIN D) 1000 units tablet Take 1,000 Units by mouth 2 (two) times daily.  [name] CORAL CALCIUM PO Take 1 tablet by mouth 2 (two) times daily.   [name] denosumab (PROLIA) 60 MG/ML SOLN injection Inject 60 mg into the skin every 6 (six) months.   . DENTA 5000 PLUS 1.1 % CREA dental cream Place 1 application onto teeth 2 (two) times daily. as directed  . DULoxetine (CYMBALTA) 60 MG capsule Take 1 capsule (60 mg total) by mouth 2 (two) times daily.  [name] ELIQUIS 5 MG TABS tablet TAKE 1 TABLET BY MOUTH TWICE A DAY  . famotidine (PEPCID) 40 MG tablet Take 40 mg by mouth 2 (two) times daily.   . ferrous gluconate (FERGON) 324 MG tablet Take 324 mg by mouth  daily with breakfast.  . furosemide (LASIX) 20 MG tablet Take 20 mg by mouth daily as needed for fluid or edema.   . gabapentin (NEURONTIN) 100 MG capsule Take 1 capsule by mouth daily.  . Insulin Pen Needle (B-D UF III MINI PEN NEEDLES) 31G X 5 MM MISC once daily.  [name] levothyroxine (SYNTHROID, LEVOTHROID) 100 MCG tablet Take 100 mcg by mouth daily before breakfast.   . lisinopril (PRINIVIL,ZESTRIL) 10 MG tablet Take 10 mg by mouth every morning.   . Magnesium 500 MG TABS Take 500 mg by mouth every morning.   . metoprolol succinate (TOPROL-XL) 50 MG 24 hr tablet Take by mouth.  . mirtazapine (REMERON) 15 MG tablet Take by mouth.  . Multiple Vitamin (MULTIVITAMIN WITH MINERALS) TABS tablet Take 1 tablet by mouth daily.  . pantoprazole (PROTONIX) 40 MG tablet Take 40 mg by  mouth every morning.   . rosuvastatin (CRESTOR) 20 MG tablet Take 20 mg by mouth every morning.   . traMADol (ULTRAM) 50 MG tablet Take 1 tablet (50 mg total) by mouth every 6 (six) hours as needed.  . TRESIBA FLEXTOUCH 200 UNIT/ML SOPN Inject 30 Units as directed at bedtime.   . TYMLOS 3120 MCG/1.56ML SOPN   . VASCEPA 1 g CAPS Take 1 capsule by mouth 2 (two) times daily.  . vitamin B-12 (CYANOCOBALAMIN) 1000 MCG tablet Take 1,000 mcg by mouth daily.  . vitamin E 400 UNIT capsule Take 400 Units by mouth daily.  [name] zolpidem (AMBIEN) 5 MG tablet Take 1 tablet (5 mg total) by mouth at bedtime. (Patient taking differently: Take 5 mg by mouth at bedtime as needed. )   Current Facility-Administered Medications (Other)  Medication Route  . Bevacizumab (AVASTIN) SOLN 1.25 mg Intravitreal  . Bevacizumab (AVASTIN) SOLN 1.25 mg Intravitreal  . Bevacizumab (AVASTIN) SOLN 1.25 mg Intravitreal  . Bevacizumab (AVASTIN) SOLN 1.25 mg Intravitreal  . insulin aspart (novoLOG) injection 0-15 Units Subcutaneous  . insulin aspart (novoLOG) injection 0-5 Units Subcutaneous      REVIEW OF SYSTEMS: ROS    Positive for: Musculoskeletal, Endocrine, Cardiovascular, Eyes   Negative for: Constitutional, Gastrointestinal, Neurological, Skin, Genitourinary, HENT, Respiratory, Psychiatric, Allergic/Imm, Heme/Lymph   Last edited by Joni Reining, COA on 07/26/2019  2:18 PM. (History)       ALLERGIES No Known Allergies  PAST MEDICAL HISTORY Past Medical History:  Diagnosis Date  . Anemia   . Anxiety   . Arthritis    Gout  . Cataracts, both eyes   . Diabetic retinopathy (HCC)    NPDR OU  . Diabetic retinopathy (HCC)   . GERD (gastroesophageal reflux disease)   . Gout   . Headache    h/o migraines  . History of fracture of patella    right knee  . History of positive PPD    Patient always shows positive  . Hyperlipidemia   . Hypertension   . Hypertensive retinopathy    OU  . Hypothyroidism   .  Lichen sclerosus 12/30/2013   of vulva  . Metatarsal fracture   . Neuropathy   . Peripheral vascular disease (HCC)   . Polyneuropathy    numbness and tingling in feet and toes  . Renal insufficiency    Stage 3  . Sleep apnea    does not use cpap-lost weight   . Type 2 diabetes mellitus, uncontrolled (HCC)    Past Surgical History:  Procedure Laterality Date  . ABDOMINAL HYSTERECTOMY    . APPENDECTOMY    .  BREAST REDUCTION SURGERY  2001  . CATARACT EXTRACTION    . CESAREAN SECTION  1976  . COLONOSCOPY  03/05/2013   Nml - due for repeat 03/06/2018  . COLONOSCOPY WITH PROPOFOL N/A 03/18/2019   Procedure: COLONOSCOPY WITH PROPOFOL;  Surgeon: Toledo, [name], MD;  Location: [facility];  Service: Gastroenterology;  Laterality: N/A;  . DIAGNOSTIC LAPAROSCOPY    . DILATION AND CURETTAGE OF UTERUS  1989  . ENDARTERECTOMY FEMORAL Bilateral 03/09/2018   Procedure: ENDARTERECTOMY FEMORAL;  Surgeon: Annice Needy, MD;  Location: [facility];  Service: Vascular;  Laterality: Bilateral;  . ENDARTERECTOMY POPLITEAL Left 03/09/2018   Procedure: ENDARTERECTOMY POPLITEAL AND SFA;  Surgeon: Annice Needy, MD;  Location: [facility];  Service: Vascular;  Laterality: Left;  . ESOPHAGOGASTRODUODENOSCOPY  03/05/2013  . ESOPHAGOGASTRODUODENOSCOPY (EGD) WITH PROPOFOL N/A 03/18/2019   Procedure: ESOPHAGOGASTRODUODENOSCOPY (EGD) WITH PROPOFOL;  Surgeon: Toledo, [name], MD;  Location: [facility];  Service: Gastroenterology;  Laterality: N/A;  . EYE SURGERY    . Eyelid Surgery  2012  . INTRAMEDULLARY (IM) NAIL INTERTROCHANTERIC Left 10/30/2015   Procedure: INTRAMEDULLARY (IM) NAIL INTERTROCHANTRIC ;  Surgeon: Kennedy Bucker, MD;  Location: [facility];  Service: Orthopedics;  Laterality: Left;  . KYPHOPLASTY N/A 10/25/2018   Procedure: L4 KYPHOPLASTY;  Surgeon: Kennedy Bucker, MD;  Location: [facility];  Service: Orthopedics;  Laterality: N/A;  . LAPAROSCOPIC HYSTERECTOMY  2000   total  . LOWER EXTREMITY ANGIOGRAPHY  Left 03/08/2017   Procedure: LOWER EXTREMITY ANGIOGRAPHY;  Surgeon: Annice Needy, MD;  Location: [facility];  Service: Cardiovascular;  Laterality: Left;  . LOWER EXTREMITY ANGIOGRAPHY Left 10/30/2017   Procedure: LOWER EXTREMITY ANGIOGRAPHY;  Surgeon: Annice Needy, MD;  Location: [facility];  Service: Cardiovascular;  Laterality: Left;  . LOWER EXTREMITY ANGIOGRAPHY Right 03/08/2018   Procedure: LOWER EXTREMITY ANGIOGRAPHY;  Surgeon: Annice Needy, MD;  Location: [facility];  Service: Cardiovascular;  Laterality: Right;  . LOWER EXTREMITY ANGIOGRAPHY Left 10/01/2018   Procedure: LOWER EXTREMITY ANGIOGRAPHY;  Surgeon: Annice Needy, MD;  Location: [facility];  Service: Cardiovascular;  Laterality: Left;  . LOWER EXTREMITY ANGIOGRAPHY Right 10/08/2018   Procedure: LOWER EXTREMITY ANGIOGRAPHY;  Surgeon: Annice Needy, MD;  Location: [facility];  Service: Cardiovascular;  Laterality: Right;  . PERIPHERAL VASCULAR INTERVENTION  03/08/2018   Procedure: PERIPHERAL VASCULAR INTERVENTION;  Surgeon: Annice Needy, MD;  Location: [facility];  Service: Cardiovascular;;  . REDUCTION MAMMAPLASTY  1997  . SACROPLASTY N/A 10/25/2018   Procedure: S1 SACROPLASTY;  Surgeon: Kennedy Bucker, MD;  Location: [facility];  Service: Orthopedics;  Laterality: N/A;    FAMILY HISTORY Family History  Problem Relation Age of Onset  . Coronary artery disease Father   . Heart attack Father   . Coronary artery disease Mother   . Heart attack Mother   . Ovarian cancer Sister 36       sister had hormonal therapy for IVF txs-which increased risk factor for ovarian cancer  . Breast cancer Neg Hx     SOCIAL HISTORY Social History   Tobacco Use  . Smoking status: Former Smoker    Packs/day: 1.00    Years: 20.00    Pack years: 20.00    Types: Cigarettes    Quit date: 03/07/1996    Years since quitting: 23.4  . Smokeless tobacco: Never Used  . Tobacco comment: started  smoking at age 72  Vaping Use  .  Vaping Use: Never used  Substance Use Topics  . Alcohol use: No    Alcohol/week: 0.0 standard drinks  . Drug use: No         OPHTHALMIC EXAM:  Base Eye Exam    Visual Acuity (Snellen - Linear)      Right Left   [place] 20/40 20/30 +2   Dist ph [place]  20/25       Tonometry (Tonopen, 2:23 PM)      Right Left   Pressure 16 16       Pupils      Pupils   Right PERRL   Left PERRL       Visual Fields (Counting fingers)      Left Right    Full Full       Extraocular Movement      Right Left    Full, Ortho Full, Ortho       Neuro/Psych    Oriented x3: Yes   Mood/Affect: Normal       Dilation    Both eyes: 1.0% Mydriacyl, 2.5% Phenylephrine @ 2:23 PM        Slit Lamp and Fundus Exam    External Exam      Right Left   External Normal Normal       Slit Lamp Exam      Right Left   Lids/Lashes dermatochalasis dermatochalasis   Conjunctiva/Sclera White and quiet White and quiet   Cornea arcus; well healed cataract wound; 2-3+ diffuse Punctate epithelial erosions, decreased TBUT, mild Anterior basement membrane dystrophy superiorly arcus; well healed cataract wound, 2-3+ diffuse Punctate epithelial erosions, irregualr epi surface, decreased TBUT   Anterior Chamber Deep and quiet Deep and quiet   Iris Round and dilated Round and dilated   Lens PCIOL; open PC PCIOL; open PC   Vitreous syneresis, Posterior vitreous detachment, vitreous condensations inferiorly syneresis, Posterior vitreous detachment       Fundus Exam      Right Left   Disc Superior hyperemia and mild edema, +IRH superior disc - persistent, mild Pallor Pink and Sharp   C/D Ratio 0.5 0.5   Macula Flat, Blunted foveal reflex, persistent cystic changes slightly improved, +Epiretinal membrane, no heme flat; blunted foveal reflex, no heme or edema, small pigment clump IT to fovea   Vessels Vascular attenuation, Tortuous Vascular attenuation   Periphery attached; MAs/DBH  inferior--slightly improved Attached, no heme          IMAGING AND PROCEDURES  Imaging and Procedures for 04/25/17  OCT, Retina - OU - Both Eyes       Right Eye Quality was good. Central Foveal Thickness: 364. Progression has improved. Findings include abnormal foveal contour, epiretinal membrane, intraretinal fluid, no SRF (Interval improvement in cystic changes IT to fovea).   Left Eye Quality was good. Central Foveal Thickness: 280. Progression has been stable. Findings include normal foveal contour, no IRF, no SRF (Trace ERM).   Notes *Images captured and stored on drive  Diagnosis / Impression:  OD: interval improvement in cystic changes IT to fovea OS: NFP; no IRF/SRF--stable, trace ERM  Clinical management:  See below  Abbreviations: NFP - Normal foveal profile. CME - cystoid macular edema. PED - pigment epithelial detachment. IRF - intraretinal fluid. SRF - subretinal fluid. EZ - ellipsoid zone. ERM - epiretinal membrane. ORA - outer retinal atrophy. ORT - outer retinal tubulation. SRHM - subretinal hyper-reflective material        Intravitreal Injection, Pharmacologic Agent -  OD - Right Eye       Time Out 07/26/2019. 3:30 PM. Confirmed correct patient, procedure, site, and patient consented.   Anesthesia Topical anesthesia was used. Anesthetic medications included Lidocaine 2%, Proparacaine 0.5%.   Procedure Preparation included 5% betadine to ocular surface, eyelid speculum. A (32g) needle was used.   Injection:  2 mg aflibercept Gretta Cool(EYLEA) SOLN   NDC: [id number], Lot: [id number], Expiration date: 11/03/2019   Route: Intravitreal, Site: Right Eye, Waste: 0.05 mL  Post-op Post injection exam found visual acuity of at least counting fingers. The patient tolerated the procedure well. There were no complications. The patient received written and verbal post procedure care education.                 ASSESSMENT/PLAN:    ICD-10-CM   1. Branch retinal  vein occlusion of right eye with macular edema  H34.8310 Intravitreal Injection, Pharmacologic Agent - OD - Right Eye    aflibercept (EYLEA) SOLN 2 mg  2. Retinal edema  H35.81 OCT, Retina - OU - Both Eyes  3. Both eyes affected by mild nonproliferative diabetic retinopathy with macular edema, associated with type 2 diabetes mellitus (HCC)  U98.1191E11.3213   4. Essential hypertension  I10   5. Hypertensive retinopathy of both eyes  H35.033   6. Epiretinal membrane (ERM) of right eye  H35.371   7. Pseudophakia of both eyes  Z96.1   8. 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 BRVO w/ CME OD  - by history, pt states symptoms first noticed 2 wks prior to presentation, but reports changes may have occurred prior  - initial exam with differential tortuosity of vessels (OD > OS)  - FA (02.10.20) shows mild late staining / leakage in macula, staining / leakage of disc -- improving CME  - differential includes DM2 (DME), hypertensive retinopathy, inflammatory etiology / uveitis  - S/P IVA OD #1 (02.08.19), #2 (03.11.19), #3 (04.09.19), #4 (05.20.19), #5 (02.10.20)  - gave IVA OD on 2.10.20 due to pending Eylea4U for 2020 -- resulted in increased IRF/CME  - review of OCTs show persistent IRF and cystic changes --  resistance to IVA   - June 2019 -- switched therapies: S/P IVE OD #1 (06.24.19), #2 (07.24.19), #3 (09.04.19), #4 (10.30.19),#5 (12.30.19), #6 (03.23.20), #7 (05.05.20), #8 (07.16.20), #9 (07.17.20), #10 (08.28.20), #11 (10.13.20), # 12 (11.17.20), #13 (2.8.21), #14 (03.09.21), #15 (04.13.21), #16 (05.11.21), #17 (06.17.21)  - OCT today shows interval improvement in IRF/cystic changes  - BCVA 20/40 (down from 20/30-1)  - Eylea4U benefits investigation for 2021 now completed and pt approved for IVE  - recommend IVE OD #18 today, 07.23.21  - RBA of procedure discussed, questions answered  - informed consent obtained  - see procedure note  - Eylea informed consent form obtained and scanned  on 11.19.2020  - F/U 5 weeks  -- DFE/OCT/possible injection  3. Mild nonproliferative diabetic retinopathy, both eyes  - The incidence, risk factors for progression, natural history and treatment options for diabetic retinopathy were discussed with patient.    - The need for close monitoring of blood glucose, blood pressure, and serum lipids, avoiding cigarette or any type of tobacco, and the need for long term follow up was also discussed with patient.  - could be contributing to CME OD  - OS with minimal diabetic retinopathy  - continue to monitor  4,5. Hypertensive retinopathy OU - stable  - as above, may have contributing to CME  OD  - discussed importance of tight BP control  - monitor  6. Epiretinal membrane, right eye   - stable nasal ERM  - no indication for surgery at this time  7. Pseudophakia OU  - s/p CE/IOL OU by cataract surgeon in [facility]  - doing well  - monitor  8. Optic disc edema OD -- sectoral  - likely secondary to BRVO but differential includes carotid stenosis and retro-orbital mass  - history of blood clots  - recommend CT orbits w/ contrast to r/o retro-orbital mass -- not obtained  - recommend carotid dopplers to r/o stenosis / occlusion -- pt scheduled for repeat u/s due to recent bilateral femoral endarterectomies -- strong history of peripheral vascular disease   Ophthalmic Meds Ordered this visit:  Meds ordered this encounter  Medications  . aflibercept (EYLEA) SOLN 2 mg       Return in about 5 weeks (around 08/30/2019) for DFE, OCT.  There are no Patient Instructions on file for this visit.   Explained the diagnoses, plan, and follow up with the patient and they expressed understanding.  Patient expressed understanding of the importance of proper follow up care.   This document serves as a record of services personally performed by Karie Chimera, MD, PhD. It was created on their behalf by Glee Arvin. Manson Passey, OA an ophthalmic technician. The  creation of this record is the provider's dictation and/or activities during the visit.    Electronically signed by: Glee Arvin. Manson Passey, [place] 07.20.2021 11:46 PM  This document serves as a record of services personally performed by Karie Chimera, MD, PhD. It was created on their behalf by Annalee Genta, COMT. The creation of this record is the provider's dictation and/or activities during the visit.  Electronically signed by: Annalee Genta, COMT 07/26/19 11:46 PM   Karie Chimera, M.D., Ph.D. Diseases & Surgery of the Retina and Vitreous [facility]  I have reviewed the above documentation for accuracy and completeness, and I agree with the above. Karie Chimera, M.D., Ph.D. 07/26/19 11:46 PM   Abbreviations: M myopia (nearsighted); A astigmatism; H hyperopia (farsighted); P presbyopia; Mrx spectacle prescription;  CTL contact lenses; OD right eye; OS left eye; OU both eyes  XT exotropia; ET esotropia; PEK punctate epithelial keratitis; PEE punctate epithelial erosions; DES dry eye syndrome; MGD meibomian gland dysfunction; ATs artificial tears; PFAT's preservative free artificial tears; NSC nuclear sclerotic cataract; PSC posterior subcapsular cataract; ERM epi-retinal membrane; PVD posterior vitreous detachment; RD retinal detachment; DM diabetes mellitus; DR diabetic retinopathy; NPDR non-proliferative diabetic retinopathy; PDR proliferative diabetic retinopathy; CSME clinically significant macular edema; DME diabetic macular edema; dbh dot blot hemorrhages; CWS cotton wool spot; POAG primary open angle glaucoma; C/D cup-to-disc ratio; HVF humphrey visual field; GVF goldmann visual field; OCT optical coherence tomography; IOP intraocular pressure; BRVO Branch retinal vein occlusion; CRVO central retinal vein occlusion; CRAO central retinal artery occlusion; BRAO branch retinal artery occlusion; RT retinal tear; SB scleral buckle; PPV pars plana vitrectomy; VH Vitreous  hemorrhage; PRP panretinal laser photocoagulation; IVK intravitreal kenalog; VMT vitreomacular traction; MH Macular hole;  NVD neovascularization of the disc; NVE neovascularization elsewhere; AREDS age related eye disease study; ARMD age related macular degeneration; POAG primary open angle glaucoma; EBMD epithelial/anterior basement membrane dystrophy; ACIOL anterior chamber intraocular lens; IOL intraocular lens; PCIOL posterior chamber intraocular lens; Phaco/IOL phacoemulsification with intraocular lens placement; PRK photorefractive keratectomy; LASIK laser assisted in situ keratomileusis; HTN hypertension; DM diabetes mellitus; COPD chronic  obstructive pulmonary disease

## 2019-07-26 ENCOUNTER — Other Ambulatory Visit: Payer: Self-pay

## 2019-07-26 ENCOUNTER — Ambulatory Visit (INDEPENDENT_AMBULATORY_CARE_PROVIDER_SITE_OTHER): Payer: Medicare Other | Admitting: Ophthalmology

## 2019-07-26 ENCOUNTER — Encounter (INDEPENDENT_AMBULATORY_CARE_PROVIDER_SITE_OTHER): Payer: Self-pay | Admitting: Ophthalmology

## 2019-07-26 DIAGNOSIS — H34831 Tributary (branch) retinal vein occlusion, right eye, with macular edema: Secondary | ICD-10-CM | POA: Diagnosis not present

## 2019-07-26 DIAGNOSIS — E113213 Type 2 diabetes mellitus with mild nonproliferative diabetic retinopathy with macular edema, bilateral: Secondary | ICD-10-CM

## 2019-07-26 DIAGNOSIS — H4710 Unspecified papilledema: Secondary | ICD-10-CM

## 2019-07-26 DIAGNOSIS — H3581 Retinal edema: Secondary | ICD-10-CM | POA: Diagnosis not present

## 2019-07-26 DIAGNOSIS — H35033 Hypertensive retinopathy, bilateral: Secondary | ICD-10-CM

## 2019-07-26 DIAGNOSIS — H35371 Puckering of macula, right eye: Secondary | ICD-10-CM

## 2019-07-26 DIAGNOSIS — I10 Essential (primary) hypertension: Secondary | ICD-10-CM

## 2019-07-26 DIAGNOSIS — Z961 Presence of intraocular lens: Secondary | ICD-10-CM

## 2019-07-26 MED ORDER — AFLIBERCEPT 2MG/0.05ML IZ SOLN FOR KALEIDOSCOPE
2.0000 mg | INTRAVITREAL | Status: AC | PRN
  Administered 2019-07-26: 2 mg via INTRAVITREAL

## 2019-08-06 ENCOUNTER — Encounter (INDEPENDENT_AMBULATORY_CARE_PROVIDER_SITE_OTHER): Payer: Self-pay | Admitting: Nurse Practitioner

## 2019-08-06 ENCOUNTER — Ambulatory Visit (INDEPENDENT_AMBULATORY_CARE_PROVIDER_SITE_OTHER): Payer: Medicare Other

## 2019-08-06 ENCOUNTER — Ambulatory Visit (INDEPENDENT_AMBULATORY_CARE_PROVIDER_SITE_OTHER): Payer: Medicare Other | Admitting: Nurse Practitioner

## 2019-08-06 ENCOUNTER — Other Ambulatory Visit: Payer: Self-pay

## 2019-08-06 VITALS — BP 136/70 | HR 102 | Resp 16 | Wt 163.0 lb

## 2019-08-06 DIAGNOSIS — I7025 Atherosclerosis of native arteries of other extremities with ulceration: Secondary | ICD-10-CM | POA: Diagnosis not present

## 2019-08-06 DIAGNOSIS — I70219 Atherosclerosis of native arteries of extremities with intermittent claudication, unspecified extremity: Secondary | ICD-10-CM | POA: Diagnosis not present

## 2019-08-06 DIAGNOSIS — E1159 Type 2 diabetes mellitus with other circulatory complications: Secondary | ICD-10-CM

## 2019-08-06 DIAGNOSIS — I10 Essential (primary) hypertension: Secondary | ICD-10-CM

## 2019-08-06 DIAGNOSIS — E782 Mixed hyperlipidemia: Secondary | ICD-10-CM | POA: Diagnosis not present

## 2019-08-06 NOTE — Progress Notes (Signed)
Subjective:    Patient ID: Cheryl Leonard, female    DOB: 1945-04-19, 74 y.o.   MRN: [id number] Chief Complaint  Patient presents with  . Follow-up    ultrasound follow up    The patient returns to the office for followup and review of the noninvasive studies.  The patient's most recent intervention was approximately 6 months ago.  There have been no interval changes in lower extremity symptoms. No interval shortening of the patient's claudication distance or development of rest pain symptoms. No new ulcers or wounds have occurred since the last visit.  Patient has had a callus shaved on her left foot recently due to some bone overgrowth.  There have been no significant changes to the patient's overall health care.  The patient denies amaurosis fugax or recent TIA symptoms. There are no recent neurological changes noted. The patient denies history of DVT, PE or superficial thrombophlebitis. The patient denies recent episodes of angina or shortness of breath.   ABI Rt=1.11 and Lt=1.02  (previous ABI's Rt=1.14 and Lt=1.12) Duplex ultrasound of the bilateral tibial arteries has triphasic waveforms with good strong toe waveforms bilaterally.   Review of Systems  Skin: Positive for wound.  All other systems reviewed and are negative.      Objective:   Physical Exam Vitals reviewed.  HENT:     Head: Normocephalic.  Cardiovascular:     Rate and Rhythm: Normal rate and regular rhythm.  Pulmonary:     Effort: Pulmonary effort is normal.  Musculoskeletal:        General: Signs of injury present. Normal range of motion.  Skin:    Capillary Refill: Capillary refill takes less than 2 seconds.  Neurological:     Mental Status: She is alert and oriented to person, place, and time.  Psychiatric:        Mood and Affect: Mood normal.        Behavior: Behavior normal.        Thought Content: Thought content normal.        Judgment: Judgment normal.     BP 136/70 (BP Location:  Right Arm)   Pulse (!) 102   Resp 16   Wt 163 lb (73.9 kg)   BMI 27.12 kg/m   Past Medical History:  Diagnosis Date  . Anemia   . Anxiety   . Arthritis    Gout  . Cataracts, both eyes   . Diabetic retinopathy (HCC)    NPDR OU  . Diabetic retinopathy (HCC)   . GERD (gastroesophageal reflux disease)   . Gout   . Headache    h/o migraines  . History of fracture of patella    right knee  . History of positive PPD    Patient always shows positive  . Hyperlipidemia   . Hypertension   . Hypertensive retinopathy    OU  . Hypothyroidism   . Lichen sclerosus 12/30/2013   of vulva  . Metatarsal fracture   . Neuropathy   . Peripheral vascular disease (HCC)   . Polyneuropathy    numbness and tingling in feet and toes  . Renal insufficiency    Stage 3  . Sleep apnea    does not use cpap-lost weight   . Type 2 diabetes mellitus, uncontrolled (HCC)     Social History   Socioeconomic History  . Marital status: Married    Spouse name: Jonny Ruiz  . Number of children: 3  . Years of education: Not on file  .  Highest education level: Not on file  Occupational History  . Occupation: Barrister's clerk  Tobacco Use  . Smoking status: Former Smoker    Packs/day: 1.00    Years: 20.00    Pack years: 20.00    Types: Cigarettes    Quit date: 03/07/1996    Years since quitting: 23.4  . Smokeless tobacco: Never Used  . Tobacco comment: started smoking at age 48  Vaping Use  . Vaping Use: Never used  Substance and Sexual Activity  . Alcohol use: No    Alcohol/week: 0.0 standard drinks  . Drug use: No  . Sexual activity: Yes    Partners: Male    Birth control/protection: Surgical  Other Topics Concern  . Not on file  Social History Narrative   Lives in [place]; with husband; quit > 20 years; no alcohol; used to work at [employer] at [employer].    Social Determinants of Health   Financial Resource Strain: Low Risk   . Difficulty of Paying Living Expenses: Not very hard  Food  Insecurity: No Food Insecurity  . Worried About Programme researcher, broadcasting/film/video in the Last Year: Never true  . Ran Out of Food in the Last Year: Never true  Transportation Needs: Unknown  . Lack of Transportation (Medical): No  . Lack of Transportation (Non-Medical): Not on file  Physical Activity: Unknown  . Days of Exercise per Week: 2 days  . Minutes of Exercise per Session: Not on file  Stress: Stress Concern Present  . Feeling of Stress : To some extent  Social Connections: Unknown  . Frequency of Communication with Friends and Family: More than three times a week  . Frequency of Social Gatherings with Friends and Family: Not on file  . Attends Religious Services: Not on file  . Active Member of Clubs or Organizations: Not on file  . Attends Banker Meetings: Not on file  . Marital Status: Married  Catering manager Violence: Not At Risk  . Fear of Current or Ex-Partner: No  . Emotionally Abused: No  . Physically Abused: No  . Sexually Abused: No    Past Surgical History:  Procedure Laterality Date  . ABDOMINAL HYSTERECTOMY    . APPENDECTOMY    . BREAST REDUCTION SURGERY  2001  . CATARACT EXTRACTION    . CESAREAN SECTION  1976  . COLONOSCOPY  03/05/2013   Nml - due for repeat 03/06/2018  . COLONOSCOPY WITH PROPOFOL N/A 03/18/2019   Procedure: COLONOSCOPY WITH PROPOFOL;  Surgeon: Toledo, [name], MD;  Location: [facility];  Service: Gastroenterology;  Laterality: N/A;  . DIAGNOSTIC LAPAROSCOPY    . DILATION AND CURETTAGE OF UTERUS  1989  . ENDARTERECTOMY FEMORAL Bilateral 03/09/2018   Procedure: ENDARTERECTOMY FEMORAL;  Surgeon: Annice Needy, MD;  Location: [facility];  Service: Vascular;  Laterality: Bilateral;  . ENDARTERECTOMY POPLITEAL Left 03/09/2018   Procedure: ENDARTERECTOMY POPLITEAL AND SFA;  Surgeon: Annice Needy, MD;  Location: [facility];  Service: Vascular;  Laterality: Left;  . ESOPHAGOGASTRODUODENOSCOPY  03/05/2013  . ESOPHAGOGASTRODUODENOSCOPY (EGD) WITH  PROPOFOL N/A 03/18/2019   Procedure: ESOPHAGOGASTRODUODENOSCOPY (EGD) WITH PROPOFOL;  Surgeon: Toledo, [name], MD;  Location: [facility];  Service: Gastroenterology;  Laterality: N/A;  . EYE SURGERY    . Eyelid Surgery  2012  . INTRAMEDULLARY (IM) NAIL INTERTROCHANTERIC Left 10/30/2015   Procedure: INTRAMEDULLARY (IM) NAIL INTERTROCHANTRIC ;  Surgeon: Kennedy Bucker, MD;  Location: [facility];  Service: Orthopedics;  Laterality: Left;  . KYPHOPLASTY N/A  10/25/2018   Procedure: L4 KYPHOPLASTY;  Surgeon: Kennedy BuckerMenz, Michael, MD;  Location: [facility];  Service: Orthopedics;  Laterality: N/A;  . LAPAROSCOPIC HYSTERECTOMY  2000   total  . LOWER EXTREMITY ANGIOGRAPHY Left 03/08/2017   Procedure: LOWER EXTREMITY ANGIOGRAPHY;  Surgeon: Annice Needyew, Jason S, MD;  Location: [facility];  Service: Cardiovascular;  Laterality: Left;  . LOWER EXTREMITY ANGIOGRAPHY Left 10/30/2017   Procedure: LOWER EXTREMITY ANGIOGRAPHY;  Surgeon: Annice Needyew, Jason S, MD;  Location: [facility];  Service: Cardiovascular;  Laterality: Left;  . LOWER EXTREMITY ANGIOGRAPHY Right 03/08/2018   Procedure: LOWER EXTREMITY ANGIOGRAPHY;  Surgeon: Annice Needyew, Jason S, MD;  Location: [facility];  Service: Cardiovascular;  Laterality: Right;  . LOWER EXTREMITY ANGIOGRAPHY Left 10/01/2018   Procedure: LOWER EXTREMITY ANGIOGRAPHY;  Surgeon: Annice Needyew, Jason S, MD;  Location: [facility];  Service: Cardiovascular;  Laterality: Left;  . LOWER EXTREMITY ANGIOGRAPHY Right 10/08/2018   Procedure: LOWER EXTREMITY ANGIOGRAPHY;  Surgeon: Annice Needyew, Jason S, MD;  Location: [facility];  Service: Cardiovascular;  Laterality: Right;  . PERIPHERAL VASCULAR INTERVENTION  03/08/2018   Procedure: PERIPHERAL VASCULAR INTERVENTION;  Surgeon: Annice Needyew, Jason S, MD;  Location: [facility];  Service: Cardiovascular;;  . REDUCTION MAMMAPLASTY  1997  . SACROPLASTY N/A 10/25/2018   Procedure: S1 SACROPLASTY;  Surgeon: Kennedy BuckerMenz, Michael, MD;  Location: [facility];  Service: Orthopedics;  Laterality: N/A;    Family History  Problem Relation Age of Onset  . Coronary artery disease Father   . Heart attack Father   . Coronary artery disease Mother   . Heart attack Mother   . Ovarian cancer Sister [id number]       sister had hormonal therapy for IVF txs-which increased risk factor for ovarian cancer  . Breast cancer Neg Hx     No Known Allergies     Assessment & Plan:   1. 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  The patient does not voice lifestyle limiting changes at this point in time.  Noninvasive studies do not suggest clinically significant change.  No invasive studies, angiography or surgery at this time The patient should continue walking and begin a more formal exercise program.  The patient should continue antiplatelet therapy and aggressive treatment of the lipid abnormalities  No changes in the patient's medications at this time  The patient should continue wearing graduated compression socks 10-15 mmHg strength to control the mild edema.   Patient will follow up with our office in 1 year sooner if there are changes  2. Benign essential hypertension Continue antihypertensive medications as already ordered, these medications have been reviewed and there are no changes at this time.   3. Type 2 diabetes mellitus with vascular disease (HCC) Continue hypoglycemic medications as already ordered, these medications have been reviewed and there are no changes at this time.  Hgb A1C to be monitored as already arranged by primary service   4. Hyperlipidemia, mixed Continue statin as ordered and reviewed, no changes at this time    Current Outpatient Medications on File Prior to Visit  Medication Sig Dispense Refill  . acetaminophen (TYLENOL) 500 MG tablet Take 1,000 mg by mouth every 6 (six)  hours as needed for moderate pain or headache.    . Aflibercept (EYLEA) 2 MG/0.05ML SOLN 2 mg by Intravitreal route every 5 (five) weeks.     .[name]  ALPRAZolam (XANAX) 0.25 MG tablet Take 0.25 mg by mouth daily as needed for anxiety or sleep.     [name] amLODipine (NORVASC) 5 MG tablet Take by mouth.    . Ascorbic Acid (VITAMIN C) 1000 MG tablet Take 1,000 mg by mouth daily.    [name] aspirin EC 81 MG tablet Take 81 mg by mouth daily.     [name] buPROPion (WELLBUTRIN XL) 150 MG 24 hr tablet Take 150 mg by mouth daily.     [name] buPROPion (WELLBUTRIN XL) 300 MG 24 hr tablet Take 300 mg by mouth daily.    . calcium carbonate (TUMS EX) 750 MG chewable tablet Chew 2,250 mg by mouth daily as needed for heartburn.    . chlorhexidine (PERIDEX) 0.12 % solution Use as directed 15 mLs in the mouth or throat 2 (two) times daily.   0  . cholecalciferol (VITAMIN D) 1000 units tablet Take 1,000 Units by mouth 2 (two) times daily.    [name] CORAL CALCIUM PO Take 1 tablet by mouth 2 (two) times daily.     [name] denosumab (PROLIA) 60 MG/ML SOLN injection Inject 60 mg into the skin every 6 (six) months.     . DENTA 5000 PLUS 1.1 % CREA dental cream Place 1 application onto teeth 2 (two) times daily. as directed  99  . DULoxetine (CYMBALTA) 60 MG capsule Take 1 capsule (60 mg total) by mouth 2 (two) times daily.  3  . ELIQUIS 5 MG TABS tablet TAKE 1 TABLET BY MOUTH TWICE A DAY 60 tablet 2  . famotidine (PEPCID) 40 MG tablet Take 40 mg by mouth 2 (two) times daily.     . ferrous gluconate (FERGON) 324 MG tablet Take 324 mg by mouth daily with breakfast.    . gabapentin (NEURONTIN) 100 MG capsule Take 1 capsule by mouth daily.    . Insulin Pen Needle (B-D UF III MINI PEN NEEDLES) 31G X 5 MM MISC once daily.    [name] levothyroxine (SYNTHROID, LEVOTHROID) 100 MCG tablet Take 100 mcg by mouth daily before breakfast.   3  . lisinopril (PRINIVIL,ZESTRIL) 10 MG tablet Take 10 mg by mouth every morning.     . Magnesium 500 MG TABS Take 500 mg by mouth every  morning.     . metoprolol succinate (TOPROL-XL) 50 MG 24 hr tablet Take by mouth.    . mirtazapine (REMERON) 15 MG tablet Take by mouth.    . Multiple Vitamin (MULTIVITAMIN WITH MINERALS) TABS tablet Take 1 tablet by mouth daily.    . pantoprazole (PROTONIX) 40 MG tablet Take 40 mg by mouth every morning.     . traMADol (ULTRAM) 50 MG tablet Take 1 tablet (50 mg total) by mouth every 6 (six) hours as needed. 20 tablet 0  . TRESIBA FLEXTOUCH 200 UNIT/ML SOPN Inject 30 Units as directed at bedtime.   5  . TYMLOS 3120 MCG/1.56ML SOPN     . VASCEPA 1 g CAPS Take 1 capsule by mouth 2 (two) times daily.    . vitamin B-12 (CYANOCOBALAMIN) 1000 MCG tablet Take 1,000 mcg by mouth daily.    . vitamin E 400 UNIT capsule Take 400 Units by mouth daily.    [name] zolpidem (AMBIEN) 5 MG tablet Take 1 tablet (5 mg total) by mouth at bedtime. (Patient taking differently: Take 5 mg by mouth at bedtime as needed. ) 30 tablet 0  . furosemide (LASIX) 20 MG tablet Take 20 mg by mouth daily as needed for  fluid or edema.     . rosuvastatin (CRESTOR) 20 MG tablet Take 20 mg by mouth every morning.      Current Facility-Administered Medications on File Prior to Visit  Medication Dose Route Frequency Provider Last Rate Last Admin  . aflibercept (EYLEA) SOLN 2 mg  2 mg Intravitreal  [name], MD   2 mg at 06/27/17 1307  . aflibercept (EYLEA) SOLN 2 mg  2 mg Intravitreal  [name], MD   2 mg at 07/26/17 1702  . aflibercept (EYLEA) SOLN 2 mg  2 mg Intravitreal  [name], MD   2 mg at 09/06/17 1700  . aflibercept (EYLEA) SOLN 2 mg  2 mg Intravitreal  [name], MD   2 mg at 11/01/17 1550  . aflibercept (EYLEA) SOLN 2 mg  2 mg Intravitreal  [name], MD   2 mg at 01/03/18 1416  . Bevacizumab (AVASTIN) SOLN 1.25 mg  1.25 mg Intravitreal  [name], MD   1.25 mg at 02/10/17 0953  . Bevacizumab (AVASTIN) SOLN 1.25 mg  1.25 mg Intravitreal  [name], MD   1.25 mg at 03/13/17 1717  . Bevacizumab  (AVASTIN) SOLN 1.25 mg  1.25 mg Intravitreal  [name], MD   1.25 mg at 04/11/17 1626  . Bevacizumab (AVASTIN) SOLN 1.25 mg  1.25 mg Intravitreal  [name], MD   1.25 mg at 02/12/18 2154  . insulin aspart (novoLOG) injection 0-15 Units  0-15 Units Subcutaneous TID WC [name], [name], PA-C      . insulin aspart (novoLOG) injection 0-5 Units  0-5 Units Subcutaneous QHS [name], [name], PA-C        There are no Patient Instructions on file for this visit. No follow-ups on file.   Georgiana Spinner, NP

## 2019-08-28 ENCOUNTER — Encounter (INDEPENDENT_AMBULATORY_CARE_PROVIDER_SITE_OTHER): Payer: Medicare Other | Admitting: Ophthalmology

## 2019-08-29 NOTE — Progress Notes (Signed)
[facility] - Clinic Note  09/02/2019     CHIEF COMPLAINT Patient presents for Retina Follow Up   HISTORY OF PRESENT ILLNESS: Cheryl Leonard is a 74 y.o. female who presents to the clinic today for:   HPI    Retina Follow Up    Patient presents with  CRVO/BRVO.  In right eye.  This started months ago.  Severity is moderate.  Duration of 5 weeks.  Since onset it is stable.  I, the attending physician,  performed the HPI with the patient and updated documentation appropriately.          Comments    74 y/o female pt here for 5 wk f/u for BRVO w/edema OD.  No change in [place] OU.  Denies pain, FOL, floaters.  Uses Blink tears prn OU.  BS 120 this a.m.  A1C 6.5 last wk.       Last edited by [name], MD on 09/02/2019  4:33 PM. (History)    Patient states she fell last night and landed on her left side  Referring physician: Thereasa Solo [address] [place],  [place] [address]  HISTORICAL INFORMATION:   Selected notes from the MEDICAL RECORD NUMBER Referred by Dr. Senaida Ores for concern of DME OD Lab Results  Component Value Date   HGBA1C 5.9 (H) 10/29/2015       CURRENT MEDICATIONS: Current Outpatient Medications (Ophthalmic Drugs)  Medication Sig  . Aflibercept (EYLEA) 2 MG/0.05ML SOLN 2 mg by Intravitreal route every 5 (five) weeks.    Current Facility-Administered Medications (Ophthalmic Drugs)  Medication Route  . aflibercept (EYLEA) SOLN 2 mg Intravitreal  . aflibercept (EYLEA) SOLN 2 mg Intravitreal  . aflibercept (EYLEA) SOLN 2 mg Intravitreal  . aflibercept (EYLEA) SOLN 2 mg Intravitreal  . aflibercept (EYLEA) SOLN 2 mg Intravitreal   Current Outpatient Medications (Other)  Medication Sig  . acetaminophen (TYLENOL) 500 MG tablet Take 1,000 mg by mouth every 6 (six) hours as needed for moderate pain or headache.  . ALPRAZolam (XANAX) 0.25 MG tablet Take 0.25 mg by mouth daily as needed for anxiety or sleep.   [name] amLODipine  (NORVASC) 5 MG tablet Take by mouth.  . Ascorbic Acid (VITAMIN C) 1000 MG tablet Take 1,000 mg by mouth daily.  [name] aspirin EC 81 MG tablet Take 81 mg by mouth daily.   [name] buPROPion (WELLBUTRIN XL) 150 MG 24 hr tablet Take 150 mg by mouth daily.   [name] buPROPion (WELLBUTRIN XL) 300 MG 24 hr tablet Take 300 mg by mouth daily.  . calcium carbonate (TUMS EX) 750 MG chewable tablet Chew 2,250 mg by mouth daily as needed for heartburn.  . chlorhexidine (PERIDEX) 0.12 % solution Use as directed 15 mLs in the mouth or throat 2 (two) times daily.   . cholecalciferol (VITAMIN D) 1000 units tablet Take 1,000 Units by mouth 2 (two) times daily.  [name] CORAL CALCIUM PO Take 1 tablet by mouth 2 (two) times daily.   [name] denosumab (PROLIA) 60 MG/ML SOLN injection Inject 60 mg into the skin every 6 (six) months.   . DENTA 5000 PLUS 1.1 % CREA dental cream Place 1 application onto teeth 2 (two) times daily. as directed  . DULoxetine (CYMBALTA) 60 MG capsule Take 1 capsule (60 mg total) by mouth 2 (two) times daily.  [name] ELIQUIS 5 MG TABS tablet TAKE 1 TABLET BY MOUTH TWICE A DAY  . famotidine (PEPCID) 40 MG tablet Take 40 mg by mouth  2 (two) times daily.   . ferrous gluconate (FERGON) 324 MG tablet Take 324 mg by mouth daily with breakfast.  . gabapentin (NEURONTIN) 100 MG capsule Take 1 capsule by mouth daily.  . Insulin Pen Needle (B-D UF III MINI PEN NEEDLES) 31G X 5 MM MISC once daily.  [name] levothyroxine (SYNTHROID, LEVOTHROID) 100 MCG tablet Take 100 mcg by mouth daily before breakfast.   . lisinopril (PRINIVIL,ZESTRIL) 10 MG tablet Take 10 mg by mouth every morning.   . Magnesium 500 MG TABS Take 500 mg by mouth every morning.   . metoprolol succinate (TOPROL-XL) 50 MG 24 hr tablet Take by mouth.  . mirtazapine (REMERON) 15 MG tablet Take by mouth.  . Multiple Vitamin (MULTIVITAMIN WITH MINERALS) TABS tablet Take 1 tablet by mouth daily.  . pantoprazole (PROTONIX) 40 MG tablet Take 40 mg by mouth every morning.   .  traMADol (ULTRAM) 50 MG tablet Take 1 tablet (50 mg total) by mouth every 6 (six) hours as needed.  . TRESIBA FLEXTOUCH 200 UNIT/ML SOPN Inject 30 Units as directed at bedtime.   . TYMLOS 3120 MCG/1.56ML SOPN   . VASCEPA 1 g CAPS Take 1 capsule by mouth 2 (two) times daily.  . vitamin B-12 (CYANOCOBALAMIN) 1000 MCG tablet Take 1,000 mcg by mouth daily.  . vitamin E 400 UNIT capsule Take 400 Units by mouth daily.  [name] zolpidem (AMBIEN) 5 MG tablet Take 1 tablet (5 mg total) by mouth at bedtime. (Patient taking differently: Take 5 mg by mouth at bedtime as needed. )  . furosemide (LASIX) 20 MG tablet Take 20 mg by mouth daily as needed for fluid or edema.   . rosuvastatin (CRESTOR) 20 MG tablet Take 20 mg by mouth every morning.    Current Facility-Administered Medications (Other)  Medication Route  . Bevacizumab (AVASTIN) SOLN 1.25 mg Intravitreal  . Bevacizumab (AVASTIN) SOLN 1.25 mg Intravitreal  . Bevacizumab (AVASTIN) SOLN 1.25 mg Intravitreal  . Bevacizumab (AVASTIN) SOLN 1.25 mg Intravitreal  . insulin aspart (novoLOG) injection 0-15 Units Subcutaneous  . insulin aspart (novoLOG) injection 0-5 Units Subcutaneous      REVIEW OF SYSTEMS: ROS    Positive for: Neurological, Genitourinary, Musculoskeletal, Endocrine, Cardiovascular, Eyes   Negative for: Constitutional, Gastrointestinal, Skin, HENT, Respiratory, Psychiatric, Allergic/Imm, Heme/Lymph   Last edited by Celine Mans, COA on 09/02/2019  1:44 PM. (History)       ALLERGIES No Known Allergies  PAST MEDICAL HISTORY Past Medical History:  Diagnosis Date  . Anemia   . Anxiety   . Arthritis    Gout  . Cataracts, both eyes   . Diabetic retinopathy (HCC)    NPDR OU  . Diabetic retinopathy (HCC)   . GERD (gastroesophageal reflux disease)   . Gout   . Headache    h/o migraines  . History of fracture of patella    right knee  . History of positive PPD    Patient always shows positive  . Hyperlipidemia   .  Hypertension   . Hypertensive retinopathy    OU  . Hypothyroidism   . Lichen sclerosus 12/30/2013   of vulva  . Metatarsal fracture   . Neuropathy   . Peripheral vascular disease (HCC)   . Polyneuropathy    numbness and tingling in feet and toes  . Renal insufficiency    Stage 3  . Sleep apnea    does not use cpap-lost weight   . Type 2 diabetes mellitus, uncontrolled (HCC)  Past Surgical History:  Procedure Laterality Date  . ABDOMINAL HYSTERECTOMY    . APPENDECTOMY    . BREAST REDUCTION SURGERY  2001  . CATARACT EXTRACTION    . CESAREAN SECTION  1976  . COLONOSCOPY  03/05/2013   Nml - due for repeat 03/06/2018  . COLONOSCOPY WITH PROPOFOL N/A 03/18/2019   Procedure: COLONOSCOPY WITH PROPOFOL;  Surgeon: Toledo, Boykin Nearingeodoro K, MD;  Location: [facility];  Service: Gastroenterology;  Laterality: N/A;  . DIAGNOSTIC LAPAROSCOPY    . DILATION AND CURETTAGE OF UTERUS  1989  . ENDARTERECTOMY FEMORAL Bilateral 03/09/2018   Procedure: ENDARTERECTOMY FEMORAL;  Surgeon: Annice Needyew, Jason S, MD;  Location: [facility];  Service: Vascular;  Laterality: Bilateral;  . ENDARTERECTOMY POPLITEAL Left 03/09/2018   Procedure: ENDARTERECTOMY POPLITEAL AND SFA;  Surgeon: Annice Needyew, Jason S, MD;  Location: [facility];  Service: Vascular;  Laterality: Left;  . ESOPHAGOGASTRODUODENOSCOPY  03/05/2013  . ESOPHAGOGASTRODUODENOSCOPY (EGD) WITH PROPOFOL N/A 03/18/2019   Procedure: ESOPHAGOGASTRODUODENOSCOPY (EGD) WITH PROPOFOL;  Surgeon: Toledo, Boykin Nearingeodoro K, MD;  Location: [facility];  Service: Gastroenterology;  Laterality: N/A;  . EYE SURGERY    . Eyelid Surgery  2012  . INTRAMEDULLARY (IM) NAIL INTERTROCHANTERIC Left 10/30/2015   Procedure: INTRAMEDULLARY (IM) NAIL INTERTROCHANTRIC ;  Surgeon: Kennedy BuckerMichael Menz, MD;  Location: [facility];  Service: Orthopedics;  Laterality: Left;  . KYPHOPLASTY N/A 10/25/2018   Procedure: L4 KYPHOPLASTY;  Surgeon: Kennedy BuckerMenz, Michael, MD;  Location: [facility];  Service: Orthopedics;  Laterality: N/A;  .  LAPAROSCOPIC HYSTERECTOMY  2000   total  . LOWER EXTREMITY ANGIOGRAPHY Left 03/08/2017   Procedure: LOWER EXTREMITY ANGIOGRAPHY;  Surgeon: Annice Needyew, Jason S, MD;  Location: [facility];  Service: Cardiovascular;  Laterality: Left;  . LOWER EXTREMITY ANGIOGRAPHY Left 10/30/2017   Procedure: LOWER EXTREMITY ANGIOGRAPHY;  Surgeon: Annice Needyew, Jason S, MD;  Location: [facility];  Service: Cardiovascular;  Laterality: Left;  . LOWER EXTREMITY ANGIOGRAPHY Right 03/08/2018   Procedure: LOWER EXTREMITY ANGIOGRAPHY;  Surgeon: Annice Needyew, Jason S, MD;  Location: [facility];  Service: Cardiovascular;  Laterality: Right;  . LOWER EXTREMITY ANGIOGRAPHY Left 10/01/2018   Procedure: LOWER EXTREMITY ANGIOGRAPHY;  Surgeon: Annice Needyew, Jason S, MD;  Location: [facility];  Service: Cardiovascular;  Laterality: Left;  . LOWER EXTREMITY ANGIOGRAPHY Right 10/08/2018   Procedure: LOWER EXTREMITY ANGIOGRAPHY;  Surgeon: Annice Needyew, Jason S, MD;  Location: [facility];  Service: Cardiovascular;  Laterality: Right;  . PERIPHERAL VASCULAR INTERVENTION  03/08/2018   Procedure: PERIPHERAL VASCULAR INTERVENTION;  Surgeon: Annice Needyew, Jason S, MD;  Location: [facility];  Service: Cardiovascular;;  . REDUCTION MAMMAPLASTY  1997  . SACROPLASTY N/A 10/25/2018   Procedure: S1 SACROPLASTY;  Surgeon: Kennedy BuckerMenz, Michael, MD;  Location: [facility];  Service: Orthopedics;  Laterality: N/A;    FAMILY HISTORY Family History  Problem Relation Age of Onset  . Coronary artery disease Father   . Heart attack Father   . Coronary artery disease Mother   . Heart attack Mother   . Ovarian cancer Sister [id number]       sister had hormonal therapy for IVF txs-which increased risk factor for ovarian cancer  . Breast cancer Neg Hx     SOCIAL HISTORY Social History   Tobacco Use  . Smoking status: Former Smoker    Packs/day: 1.00    Years: 20.00    Pack years: 20.00    Types: Cigarettes    Quit date: 03/07/1996    Years since quitting:  23.5  . Smokeless tobacco: Never Used  . Tobacco comment: started smoking at age 49  Vaping Use  . Vaping Use: Never used  Substance Use Topics  . Alcohol use: No    Alcohol/week: 0.0 standard drinks  . Drug use: No         OPHTHALMIC EXAM:  Base Eye Exam    Visual Acuity (Snellen - Linear)      Right Left   Dist [place] 20/30 -2 20/20 -2   Dist ph [place] NI        Tonometry (Tonopen, 1:47 PM)      Right Left   Pressure 13 14       Pupils      Dark Light Shape React APD   Right 3 2 Round Brisk None   Left 3 2 Round Brisk None       Visual Fields (Counting fingers)      Left Right    Full Full       Extraocular Movement      Right Left    Full, Ortho Full, Ortho       Neuro/Psych    Oriented x3: Yes   Mood/Affect: Normal       Dilation    Both eyes: 1.0% Mydriacyl, 2.5% Phenylephrine @ 1:47 PM        Slit Lamp and Fundus Exam    External Exam      Right Left   External Normal Normal       Slit Lamp Exam      Right Left   Lids/Lashes dermatochalasis dermatochalasis   Conjunctiva/Sclera White and quiet White and quiet   Cornea arcus; well healed cataract wound; 2-3+ diffuse Punctate epithelial erosions, decreased TBUT, mild Anterior basement membrane dystrophy superiorly arcus; well healed cataract wound, 2-3+ diffuse Punctate epithelial erosions, irregualr epi surface, decreased TBUT   Anterior Chamber Deep and quiet Deep and quiet   Iris Round and dilated Round and dilated   Lens PCIOL; open PC PCIOL; open PC   Vitreous syneresis, Posterior vitreous detachment, vitreous condensations inferiorly syneresis, Posterior vitreous detachment       Fundus Exam      Right Left   Disc Superior hyperemia and mild edema, +IRH superior disc - persistent, mild Pallor Pink and Sharp   C/D Ratio 0.5 0.5   Macula Flat, Blunted foveal reflex, cystic changes slightly improved, +Epiretinal membrane, no heme flat; blunted foveal reflex, no heme or edema, small pigment  clump IT to fovea   Vessels Vascular attenuation, Tortuous Vascular attenuation   Periphery attached; MAs/DBH inferior--slightly improved Attached, no heme          IMAGING AND PROCEDURES  Imaging and Procedures for 04/25/17  OCT, Retina - OU - Both Eyes       Right Eye Quality was borderline. Central Foveal Thickness: 336. Progression has improved. Findings include abnormal foveal contour, epiretinal membrane, no SRF, no IRF (Interval improvement in cystic changes IT to fovea).   Left Eye Quality was good. Central Foveal Thickness: 280. Progression has been stable. Findings include normal foveal contour, no IRF, no SRF (Trace ERM).   Notes *Images captured and stored on drive  Diagnosis / Impression:  OD: interval improvement in cystic changes IT to fovea OS: NFP; no IRF/SRF--stable, trace ERM  Clinical management:  See below  Abbreviations: NFP - Normal foveal profile. CME - cystoid macular edema. PED - pigment epithelial detachment. IRF - intraretinal fluid. SRF - subretinal fluid. EZ -  ellipsoid zone. ERM - epiretinal membrane. ORA - outer retinal atrophy. ORT - outer retinal tubulation. SRHM - subretinal hyper-reflective material        Intravitreal Injection, Pharmacologic Agent - OD - Right Eye       Time Out 09/02/2019. 2:41 PM. Confirmed correct patient, procedure, site, and patient consented.   Anesthesia Topical anesthesia was used. Anesthetic medications included Lidocaine 2%, Proparacaine 0.5%.   Procedure Preparation included 5% betadine to ocular surface, eyelid speculum. A (32g) needle was used.   Injection:  2 mg aflibercept Gretta Cool) SOLN   NDC: L6038910, Lot: [id number], Expiration date: 12/03/2019   Route: Intravitreal, Site: Right Eye, Waste: 0.05 mL  Post-op Post injection exam found visual acuity of at least counting fingers. The patient tolerated the procedure well. There were no complications. The patient received written and verbal  post procedure care education. Post injection medications were not given.                 ASSESSMENT/PLAN:    ICD-10-CM   1. Branch retinal vein occlusion of right eye with macular edema  H34.8310 Intravitreal Injection, Pharmacologic Agent - OD - Right Eye    aflibercept (EYLEA) SOLN 2 mg  2. Retinal edema  H35.81 OCT, Retina - OU - Both Eyes  3. Both eyes affected by mild nonproliferative diabetic retinopathy with macular edema, associated with type 2 diabetes mellitus (HCC)  X52.8413   4. Essential hypertension  I10   5. Hypertensive retinopathy of both eyes  H35.033   6. Epiretinal membrane (ERM) of right eye  H35.371   7. Pseudophakia of both eyes  Z96.1     1,2. BRVO w/ CME OD  - by history, pt states symptoms first noticed 2 wks prior to presentation, but reports changes may have occurred prior  - initial exam with differential tortuosity of vessels (OD > OS)  - FA (02.10.20) shows mild late staining / leakage in macula, staining / leakage of disc -- improving CME  - differential includes DM2 (DME), hypertensive retinopathy, inflammatory etiology / uveitis  - S/P IVA OD #1 (02.08.19), #2 (03.11.19), #3 (04.09.19), #4 (05.20.19), #5 (02.10.20)  - gave IVA OD on 2.10.20 due to pending Eylea4U for 2020 -- resulted in increased IRF/CME  - review of OCTs show persistent IRF and cystic changes --  resistance to IVA   - June 2019 -- switched therapies: S/P IVE OD #1 (06.24.19), #2 (07.24.19), #3 (09.04.19), #4 (10.30.19),#5 (12.30.19), #6 (03.23.20), #7 (05.05.20), #8 (07.16.20), #9 (07.17.20), #10 (08.28.20), #11 (10.13.20), # 12 (11.17.20), #13 (2.8.21), #14 (03.09.21), #15 (04.13.21), #16 (05.11.21), #17 (06.17.21), #18 (07.23.21)  - OCT today shows interval improvement in IRF/cystic changes OD  - BCVA 20/30 -- improved from 20/40  - Eylea4U benefits investigation for 2021 now completed and pt approved for IVE  - recommend IVE OD #19 today, 08.30.21  - RBA of procedure  discussed, questions answered  - informed consent obtained  - see procedure note  - Eylea informed consent form obtained and scanned on 11.19.2020  - F/U 5 weeks  -- DFE/OCT/possible injection  3. Mild nonproliferative diabetic retinopathy, both eyes  - The incidence, risk factors for progression, natural history and treatment options for diabetic retinopathy were discussed with patient.    - The need for close monitoring of blood glucose, blood pressure, and serum lipids, avoiding cigarette or any type of tobacco, and the need for long term follow up was also discussed with patient.  - could be contributing  to CME OD  - OS with minimal diabetic retinopathy  - continue to monitor  4,5. Hypertensive retinopathy OU - stable  - as above, may have contributing to CME OD  - discussed importance of tight BP control  - monitor  6. Epiretinal membrane, right eye   - stable nasal ERM  - no indication for surgery at this time  7. Pseudophakia OU  - s/p CE/IOL OU by cataract surgeon in [facility]  - doing well  - monitor    Ophthalmic Meds Ordered this visit:  Meds ordered this encounter  Medications  . aflibercept (EYLEA) SOLN 2 mg       Return in about 5 weeks (around 10/07/2019) for f/u BRVO OD, DFE, OCT.  There are no Patient Instructions on file for this visit.  This document serves as a record of services personally performed by Karie Chimera, MD, PhD. It was created on their behalf by Herby Abraham, COA, an ophthalmic technician. The creation of this record is the provider's dictation and/or activities during the visit.    Electronically signed by: Herby Abraham, COA 08.26.2021 4:42 PM   This document serves as a record of services personally performed by Karie Chimera, MD, PhD. It was created on their behalf by Glee Arvin. Manson Passey, OA an ophthalmic technician. The creation of this record is the provider's dictation and/or activities during the visit.    Electronically signed by:  Glee Arvin. Manson Passey, [place] 08.30.2021 4:42 PM  Karie Chimera, M.D., Ph.D. Diseases & Surgery of the Retina and Vitreous [facility] 09/02/2019   I have reviewed the above documentation for accuracy and completeness, and I agree with the above. Karie Chimera, M.D., Ph.D. 09/02/19 4:42 PM   Abbreviations: M myopia (nearsighted); A astigmatism; H hyperopia (farsighted); P presbyopia; Mrx spectacle prescription;  CTL contact lenses; OD right eye; OS left eye; OU both eyes  XT exotropia; ET esotropia; PEK punctate epithelial keratitis; PEE punctate epithelial erosions; DES dry eye syndrome; MGD meibomian gland dysfunction; ATs artificial tears; PFAT's preservative free artificial tears; NSC nuclear sclerotic cataract; PSC posterior subcapsular cataract; ERM epi-retinal membrane; PVD posterior vitreous detachment; RD retinal detachment; DM diabetes mellitus; DR diabetic retinopathy; NPDR non-proliferative diabetic retinopathy; PDR proliferative diabetic retinopathy; CSME clinically significant macular edema; DME diabetic macular edema; dbh dot blot hemorrhages; CWS cotton wool spot; POAG primary open angle glaucoma; C/D cup-to-disc ratio; HVF humphrey visual field; GVF goldmann visual field; OCT optical coherence tomography; IOP intraocular pressure; BRVO Branch retinal vein occlusion; CRVO central retinal vein occlusion; CRAO central retinal artery occlusion; BRAO branch retinal artery occlusion; RT retinal tear; SB scleral buckle; PPV pars plana vitrectomy; VH Vitreous hemorrhage; PRP panretinal laser photocoagulation; IVK intravitreal kenalog; VMT vitreomacular traction; MH Macular hole;  NVD neovascularization of the disc; NVE neovascularization elsewhere; AREDS age related eye disease study; ARMD age related macular degeneration; POAG primary open angle glaucoma; EBMD epithelial/anterior basement membrane dystrophy; ACIOL anterior chamber intraocular lens; IOL intraocular lens; PCIOL  posterior chamber intraocular lens; Phaco/IOL phacoemulsification with intraocular lens placement; PRK photorefractive keratectomy; LASIK laser assisted in situ keratomileusis; HTN hypertension; DM diabetes mellitus; COPD chronic obstructive pulmonary disease

## 2019-08-31 ENCOUNTER — Other Ambulatory Visit (INDEPENDENT_AMBULATORY_CARE_PROVIDER_SITE_OTHER): Payer: Self-pay | Admitting: Vascular Surgery

## 2019-09-02 ENCOUNTER — Encounter (INDEPENDENT_AMBULATORY_CARE_PROVIDER_SITE_OTHER): Payer: Self-pay | Admitting: Ophthalmology

## 2019-09-02 ENCOUNTER — Other Ambulatory Visit: Payer: Self-pay

## 2019-09-02 ENCOUNTER — Ambulatory Visit (INDEPENDENT_AMBULATORY_CARE_PROVIDER_SITE_OTHER): Payer: Medicare Other | Admitting: Ophthalmology

## 2019-09-02 DIAGNOSIS — I10 Essential (primary) hypertension: Secondary | ICD-10-CM

## 2019-09-02 DIAGNOSIS — H34831 Tributary (branch) retinal vein occlusion, right eye, with macular edema: Secondary | ICD-10-CM

## 2019-09-02 DIAGNOSIS — E113213 Type 2 diabetes mellitus with mild nonproliferative diabetic retinopathy with macular edema, bilateral: Secondary | ICD-10-CM

## 2019-09-02 DIAGNOSIS — Z961 Presence of intraocular lens: Secondary | ICD-10-CM

## 2019-09-02 DIAGNOSIS — H3581 Retinal edema: Secondary | ICD-10-CM

## 2019-09-02 DIAGNOSIS — H35371 Puckering of macula, right eye: Secondary | ICD-10-CM

## 2019-09-02 DIAGNOSIS — H35033 Hypertensive retinopathy, bilateral: Secondary | ICD-10-CM

## 2019-09-02 MED ORDER — AFLIBERCEPT 2MG/0.05ML IZ SOLN FOR KALEIDOSCOPE
2.0000 mg | INTRAVITREAL | Status: AC | PRN
  Administered 2019-09-02: 2 mg via INTRAVITREAL

## 2019-09-04 ENCOUNTER — Inpatient Hospital Stay: Payer: Medicare Other

## 2019-09-04 ENCOUNTER — Telehealth: Payer: Self-pay | Admitting: Internal Medicine

## 2019-09-04 ENCOUNTER — Inpatient Hospital Stay: Payer: Medicare Other | Attending: Internal Medicine

## 2019-09-04 ENCOUNTER — Other Ambulatory Visit: Payer: Self-pay

## 2019-09-04 ENCOUNTER — Encounter: Payer: Self-pay | Admitting: Internal Medicine

## 2019-09-04 ENCOUNTER — Inpatient Hospital Stay (HOSPITAL_BASED_OUTPATIENT_CLINIC_OR_DEPARTMENT_OTHER): Payer: Medicare Other | Admitting: Internal Medicine

## 2019-09-04 VITALS — BP 157/71 | HR 91 | Resp 16

## 2019-09-04 DIAGNOSIS — I7025 Atherosclerosis of native arteries of other extremities with ulceration: Secondary | ICD-10-CM

## 2019-09-04 DIAGNOSIS — R5383 Other fatigue: Secondary | ICD-10-CM | POA: Insufficient documentation

## 2019-09-04 DIAGNOSIS — N183 Chronic kidney disease, stage 3 unspecified: Secondary | ICD-10-CM

## 2019-09-04 DIAGNOSIS — Z79899 Other long term (current) drug therapy: Secondary | ICD-10-CM | POA: Diagnosis not present

## 2019-09-04 DIAGNOSIS — E611 Iron deficiency: Secondary | ICD-10-CM | POA: Diagnosis not present

## 2019-09-04 DIAGNOSIS — I739 Peripheral vascular disease, unspecified: Secondary | ICD-10-CM | POA: Diagnosis not present

## 2019-09-04 DIAGNOSIS — D649 Anemia, unspecified: Secondary | ICD-10-CM | POA: Diagnosis present

## 2019-09-04 LAB — CBC WITH DIFFERENTIAL/PLATELET
Abs Immature Granulocytes: 0.02 10*3/uL (ref 0.00–0.07)
Basophils Absolute: 0 10*3/uL (ref 0.0–0.1)
Basophils Relative: 1 %
Eosinophils Absolute: 0.1 10*3/uL (ref 0.0–0.5)
Eosinophils Relative: 1 %
HCT: 23.7 % — ABNORMAL LOW (ref 36.0–46.0)
Hemoglobin: 7.7 g/dL — ABNORMAL LOW (ref 12.0–15.0)
Immature Granulocytes: 0 %
Lymphocytes Relative: 16 %
Lymphs Abs: 0.9 10*3/uL (ref 0.7–4.0)
MCH: 26.9 pg (ref 26.0–34.0)
MCHC: 32.5 g/dL (ref 30.0–36.0)
MCV: 82.9 fL (ref 80.0–100.0)
Monocytes Absolute: 0.7 10*3/uL (ref 0.1–1.0)
Monocytes Relative: 13 %
Neutro Abs: 3.9 10*3/uL (ref 1.7–7.7)
Neutrophils Relative %: 69 %
Platelets: 294 10*3/uL (ref 150–400)
RBC: 2.86 MIL/uL — ABNORMAL LOW (ref 3.87–5.11)
RDW: 15.6 % — ABNORMAL HIGH (ref 11.5–15.5)
WBC: 5.6 10*3/uL (ref 4.0–10.5)
nRBC: 0.4 % — ABNORMAL HIGH (ref 0.0–0.2)

## 2019-09-04 LAB — BASIC METABOLIC PANEL
Anion gap: 11 (ref 5–15)
BUN: 25 mg/dL — ABNORMAL HIGH (ref 8–23)
CO2: 21 mmol/L — ABNORMAL LOW (ref 22–32)
Calcium: 8.3 mg/dL — ABNORMAL LOW (ref 8.9–10.3)
Chloride: 103 mmol/L (ref 98–111)
Creatinine, Ser: 1.34 mg/dL — ABNORMAL HIGH (ref 0.44–1.00)
GFR calc Af Amer: 45 mL/min — ABNORMAL LOW (ref >60)
GFR calc non Af Amer: 39 mL/min — ABNORMAL LOW (ref >60)
Glucose, Bld: 450 mg/dL — ABNORMAL HIGH (ref 70–99)
Potassium: 4.3 mmol/L (ref 3.5–5.1)
Sodium: 135 mmol/L (ref 135–145)

## 2019-09-04 LAB — FERRITIN: Ferritin: 12 ng/mL (ref 11–307)

## 2019-09-04 LAB — IRON AND TIBC
Iron: 24 ug/dL — ABNORMAL LOW (ref 28–170)
Saturation Ratios: 6 % — ABNORMAL LOW (ref 10.4–31.8)
TIBC: 379 ug/dL (ref 250–450)
UIBC: 355 ug/dL

## 2019-09-04 MED ORDER — SODIUM CHLORIDE 0.9 % IV SOLN
Freq: Once | INTRAVENOUS | Status: AC
  Filled 2019-09-04: qty 250

## 2019-09-04 MED ORDER — IRON SUCROSE 20 MG/ML IV SOLN
200.0000 mg | Freq: Once | INTRAVENOUS | Status: AC
  Administered 2019-09-04: 200 mg via INTRAVENOUS
  Filled 2019-09-04: qty 10

## 2019-09-04 NOTE — Progress Notes (Signed)
[place] NOTE  Patient Care Team: Rusty Aus, MD as PCP - General (Internal Medicine) Josefine Class, MD as Referring Physician (Gastroenterology) Cammie Sickle, MD as Consulting Physician (Hematology and Oncology)  CHIEF COMPLAINTS/PURPOSE OF CONSULTATION: Anemia  HEMATOLOGY HISTORY  # ANEMIA- Jan 2021- 8.8/ferritin 11 [PCP]; N-WBC/platelets? IDA vs other- EGD-2015/colonoscopy-? 2015; 2020- [Dr.Skulskie] ; capsule-2016- ? 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  female anemia iron deficiency-question CKD-III s here for follow-up.  The patient was evaluated by endocrinology for elevated blood sugars; poorly controlled 500.  She complains of fatigue.  Shortness of breath especially exertion.  She denies any blood in stools or black-colored stools.  No nausea no vomiting.  Review of Systems  Constitutional: Positive for malaise/fatigue. Negative for chills, diaphoresis and fever.  HENT: Negative for nosebleeds and sore throat.   Eyes: Negative for double vision.  Respiratory: Positive for shortness of breath. Negative for cough, hemoptysis, sputum production and wheezing.   Cardiovascular: Negative for chest pain, palpitations, orthopnea and leg swelling.  Gastrointestinal: Negative for abdominal pain, blood in stool, constipation, diarrhea, heartburn, melena, nausea and vomiting.  Genitourinary: Negative for dysuria, frequency and urgency.  Musculoskeletal: Positive for joint pain.  Skin: Negative.  Negative for itching and rash.  Neurological: Negative for dizziness, tingling, focal weakness, weakness and headaches.  Endo/Heme/Allergies: Does not bruise/bleed easily.  Psychiatric/Behavioral: Negative for depression. The patient is not nervous/anxious and does not have insomnia.      MEDICAL HISTORY:  Past Medical History:  Diagnosis Date  . Anemia   . Anxiety   . Arthritis    Gout  . Cataracts, both eyes   . Diabetic retinopathy ([place])    NPDR OU  . Diabetic retinopathy ([place])   . GERD (gastroesophageal reflux disease)   . Gout   . Headache    h/o migraines  . History of fracture of patella    right knee  . History of positive PPD    Patient always shows positive  . Hyperlipidemia   . Hypertension   . Hypertensive retinopathy    OU  . Hypothyroidism   . Lichen sclerosus 61/22/4497   of vulva  . Metatarsal fracture   . Neuropathy   . Peripheral vascular disease ([place])   . Polyneuropathy    numbness and tingling in feet and toes  . Renal insufficiency    Stage 3  . Sleep apnea    does not use cpap-lost weight   . Type 2 diabetes mellitus, uncontrolled ([place])     SURGICAL HISTORY: Past Surgical History:  Procedure Laterality Date  . ABDOMINAL HYSTERECTOMY    . APPENDECTOMY    . BREAST REDUCTION SURGERY  2001  . CATARACT EXTRACTION    . CESAREAN SECTION  1976  . COLONOSCOPY  03/05/2013   Nml - due for repeat 03/06/2018  . COLONOSCOPY WITH PROPOFOL N/A 03/18/2019   Procedure: COLONOSCOPY WITH PROPOFOL;  Surgeon: Toledo, Benay Pike, MD;  Location: [facility];  Service: Gastroenterology;  Laterality: N/A;  . DIAGNOSTIC LAPAROSCOPY    . DILATION AND CURETTAGE OF UTERUS  1989  . ENDARTERECTOMY FEMORAL Bilateral 03/09/2018   Procedure: ENDARTERECTOMY FEMORAL;  Surgeon: Algernon Huxley, MD;  Location: [facility] OR[facility];  Service: Vascular;  Laterality: Bilateral;  . ENDARTERECTOMY POPLITEAL Left 03/09/2018   Procedure: ENDARTERECTOMY POPLITEAL AND  SFA;  Surgeon: Algernon Huxley, MD;  Location: [facility] ORS;  Service: Vascular;  Laterality: Left;  . ESOPHAGOGASTRODUODENOSCOPY  03/05/2013  . ESOPHAGOGASTRODUODENOSCOPY (EGD) WITH PROPOFOL N/A 03/18/2019   Procedure: ESOPHAGOGASTRODUODENOSCOPY (EGD) WITH PROPOFOL;  Surgeon: Toledo, Benay Pike, MD;  Location: [facility];   Service: Gastroenterology;  Laterality: N/A;  . EYE SURGERY    . Eyelid Surgery  2012  . INTRAMEDULLARY (IM) NAIL INTERTROCHANTERIC Left 10/30/2015   Procedure: INTRAMEDULLARY (IM) NAIL INTERTROCHANTRIC ;  Surgeon: Hessie Knows, MD;  Location: [facility] OR[facility];  Service: Orthopedics;  Laterality: Left;  . KYPHOPLASTY N/A 10/25/2018   Procedure: L4 KYPHOPLASTY;  Surgeon: Hessie Knows, MD;  Location: [facility] OR[facility];  Service: Orthopedics;  Laterality: N/A;  . LAPAROSCOPIC HYSTERECTOMY  2000   total  . LOWER EXTREMITY ANGIOGRAPHY Left 03/08/2017   Procedure: LOWER EXTREMITY ANGIOGRAPHY;  Surgeon: Algernon Huxley, MD;  Location: [place] [facility];  Service: Cardiovascular;  Laterality: Left;  . LOWER EXTREMITY ANGIOGRAPHY Left 10/30/2017   Procedure: LOWER EXTREMITY ANGIOGRAPHY;  Surgeon: Algernon Huxley, MD;  Location: [place] [facility];  Service: Cardiovascular;  Laterality: Left;  . LOWER EXTREMITY ANGIOGRAPHY Right 03/08/2018   Procedure: LOWER EXTREMITY ANGIOGRAPHY;  Surgeon: Algernon Huxley, MD;  Location: [place] [facility];  Service: Cardiovascular;  Laterality: Right;  . LOWER EXTREMITY ANGIOGRAPHY Left 10/01/2018   Procedure: LOWER EXTREMITY ANGIOGRAPHY;  Surgeon: Algernon Huxley, MD;  Location: [place] [facility];  Service: Cardiovascular;  Laterality: Left;  . LOWER EXTREMITY ANGIOGRAPHY Right 10/08/2018   Procedure: LOWER EXTREMITY ANGIOGRAPHY;  Surgeon: Algernon Huxley, MD;  Location: [place] [facility];  Service: Cardiovascular;  Laterality: Right;  . PERIPHERAL VASCULAR INTERVENTION  03/08/2018   Procedure: PERIPHERAL VASCULAR INTERVENTION;  Surgeon: Algernon Huxley, MD;  Location: [place] [facility];  Service: Cardiovascular;;  . REDUCTION MAMMAPLASTY  1997  . SACROPLASTY N/A 10/25/2018   Procedure: S1 SACROPLASTY;  Surgeon: Hessie Knows, MD;  Location: [facility];  Service: Orthopedics;  Laterality: N/A;    SOCIAL HISTORY: Social History   Socioeconomic History  . Marital status: Married    Spouse  name: Jenny Reichmann  . Number of children: 3  . Years of education: Not on file  . Highest education level: Not on file  Occupational History  . Occupation: Retail banker  Tobacco Use  . Smoking status: Former Smoker    Packs/day: 1.00    Years: 20.00    Pack years: 20.00    Types: Cigarettes    Quit date: 03/07/1996    Years since quitting: 23.5  . Smokeless tobacco: Never Used  . Tobacco comment: started smoking at age 33  Vaping Use  . Vaping Use: Never used  Substance and Sexual Activity  . Alcohol use: No    Alcohol/week: 0.0 standard drinks  . Drug use: No  . Sexual activity: Yes    Partners: Male    Birth control/protection: Surgical  Other Topics Concern  . Not on file  Social History Narrative   Lives in [place]; with husband; quit > 20 years; no alcohol; used to work at [employer] at [employer].    Social Determinants of Health   Financial Resource Strain: Low Risk   . Difficulty of Paying Living Expenses: Not very hard  Food Insecurity: No Food Insecurity  . Worried About Charity fundraiser in the Last Year: Never true  . Ran Out of Food in the Last Year: Never true  Transportation Needs: Unknown  . Lack  of Transportation (Medical): No  . Lack of Transportation (Non-Medical): Not on file  Physical Activity: Unknown  . Days of Exercise per Week: 2 days  . Minutes of Exercise per Session: Not on file  Stress: Stress Concern Present  . Feeling of Stress : To some extent  Social Connections: Unknown  . Frequency of Communication with Friends and Family: More than three times a week  . Frequency of Social Gatherings with Friends and Family: Not on file  . Attends Religious Services: Not on file  . Active Member of Clubs or Organizations: Not on file  . Attends Archivist Meetings: Not on file  . Marital Status: Married  Human resources officer Violence: Not At Risk  . Fear of Current or Ex-Partner: No  . Emotionally Abused: No  . Physically Abused: No  .  Sexually Abused: No    FAMILY HISTORY: Family History  Problem Relation Age of Onset  . Coronary artery disease Father   . Heart attack Father   . Coronary artery disease Mother   . Heart attack Mother   . Ovarian cancer Sister 52       sister had hormonal therapy for IVF txs-which increased risk factor for ovarian cancer  . Breast cancer Neg Hx     ALLERGIES:  has No Known Allergies.  MEDICATIONS:  Current Outpatient Medications  Medication Sig Dispense Refill  . acetaminophen (TYLENOL) 500 MG tablet Take 1,000 mg by mouth every 6 (six) hours as needed for moderate pain or headache.    . Aflibercept (EYLEA) 2 MG/0.05ML SOLN 2 mg by Intravitreal route every 5 (five) weeks.     . ALPRAZolam (XANAX) 0.25 MG tablet Take 0.25 mg by mouth daily as needed for anxiety or sleep.     [name] amLODipine (NORVASC) 5 MG tablet Take by mouth.    . Ascorbic Acid (VITAMIN C) 1000 MG tablet Take 1,000 mg by mouth daily.    [name] aspirin EC 81 MG tablet Take 81 mg by mouth daily.     . DULoxetine (CYMBALTA) 60 MG capsule Take 1 capsule (60 mg total) by mouth 2 (two) times daily.  3  . ELIQUIS 5 MG TABS tablet TAKE 1 TABLET BY MOUTH TWICE A DAY 60 tablet 2  . famotidine (PEPCID) 40 MG tablet Take 40 mg by mouth 2 (two) times daily.     . furosemide (LASIX) 20 MG tablet Take 20 mg by mouth daily as needed for fluid or edema.     . gabapentin (NEURONTIN) 100 MG capsule Take 1 capsule by mouth daily.    . Insulin Pen Needle (B-D UF III MINI PEN NEEDLES) 31G X 5 MM MISC once daily.    [name] levothyroxine (SYNTHROID, LEVOTHROID) 100 MCG tablet Take 100 mcg by mouth daily before breakfast.   3  . Magnesium 500 MG TABS Take 500 mg by mouth every morning.     . metoprolol succinate (TOPROL-XL) 50 MG 24 hr tablet Take by mouth.    . mirtazapine (REMERON) 15 MG tablet Take by mouth.    . Multiple Vitamin (MULTIVITAMIN WITH MINERALS) TABS tablet Take 1 tablet by mouth daily.    . pantoprazole (PROTONIX) 40 MG tablet Take  40 mg by mouth every morning.     . Potassium (POTASSIMIN PO) Take 99 mg by mouth daily.    . rosuvastatin (CRESTOR) 20 MG tablet Take 20 mg by mouth every morning.     . TRESIBA FLEXTOUCH 200 UNIT/ML SOPN Inject 30  Units as directed at bedtime.   5  . TYMLOS 3120 MCG/1.56ML SOPN     . VASCEPA 1 g CAPS Take 1 capsule by mouth 2 (two) times daily.    . vitamin B-12 (CYANOCOBALAMIN) 1000 MCG tablet Take 1,000 mcg by mouth daily.    . vitamin E 400 UNIT capsule Take 400 Units by mouth daily.    [name] zolpidem (AMBIEN) 5 MG tablet Take 1 tablet (5 mg total) by mouth at bedtime. (Patient taking differently: Take 5 mg by mouth at bedtime as needed. ) 30 tablet 0  . buPROPion (WELLBUTRIN XL) 150 MG 24 hr tablet Take 150 mg by mouth daily.     [name] buPROPion (WELLBUTRIN XL) 300 MG 24 hr tablet Take 300 mg by mouth daily.    . calcium carbonate (TUMS EX) 750 MG chewable tablet Chew 2,250 mg by mouth daily as needed for heartburn.    . chlorhexidine (PERIDEX) 0.12 % solution Use as directed 15 mLs in the mouth or throat 2 (two) times daily.   0  . cholecalciferol (VITAMIN D) 1000 units tablet Take 1,000 Units by mouth 2 (two) times daily.    [name] CORAL CALCIUM PO Take 1 tablet by mouth 2 (two) times daily.     [name] denosumab (PROLIA) 60 MG/ML SOLN injection Inject 60 mg into the skin every 6 (six) months.     . DENTA 5000 PLUS 1.1 % CREA dental cream Place 1 application onto teeth 2 (two) times daily. as directed  99  . ferrous gluconate (FERGON) 324 MG tablet Take 324 mg by mouth daily with breakfast.    . lisinopril (PRINIVIL,ZESTRIL) 10 MG tablet Take 10 mg by mouth every morning.  (Patient not taking: Reported on 09/04/2019)     Current Facility-Administered Medications  Medication Dose Route Frequency Provider Last Rate Last Admin  . aflibercept (EYLEA) SOLN 2 mg  2 mg Intravitreal  Bernarda Caffey, MD   2 mg at 06/27/17 1307  . aflibercept (EYLEA) SOLN 2 mg  2 mg Intravitreal  Bernarda Caffey, MD   2 mg at 07/26/17  1702  . aflibercept (EYLEA) SOLN 2 mg  2 mg Intravitreal  Bernarda Caffey, MD   2 mg at 09/06/17 1700  . aflibercept (EYLEA) SOLN 2 mg  2 mg Intravitreal  Bernarda Caffey, MD   2 mg at 11/01/17 1550  . aflibercept (EYLEA) SOLN 2 mg  2 mg Intravitreal  Bernarda Caffey, MD   2 mg at 01/03/18 1416  . Bevacizumab (AVASTIN) SOLN 1.25 mg  1.25 mg Intravitreal  Bernarda Caffey, MD   1.25 mg at 02/10/17 0953  . Bevacizumab (AVASTIN) SOLN 1.25 mg  1.25 mg Intravitreal  Bernarda Caffey, MD   1.25 mg at 03/13/17 1717  . Bevacizumab (AVASTIN) SOLN 1.25 mg  1.25 mg Intravitreal  Bernarda Caffey, MD   1.25 mg at 04/11/17 1626  . Bevacizumab (AVASTIN) SOLN 1.25 mg  1.25 mg Intravitreal  Bernarda Caffey, MD   1.25 mg at 02/12/18 2154  . insulin aspart (novoLOG) injection 0-15 Units  0-15 Units Subcutaneous TID WC [name], [name], PA-C      . insulin aspart (novoLOG) injection 0-5 Units  0-5 Units Subcutaneous QHS [name], [name], PA-C          PHYSICAL EXAMINATION:   Vitals:   09/04/19 1300  BP: (!) 154/70  Pulse: 95  Resp: 20  Temp: 97.9 F (36.6 C)   There were no vitals filed for this visit.  Physical Exam HENT:     Head: Normocephalic and atraumatic.     Mouth/Throat:     Pharynx: No oropharyngeal exudate.  Eyes:     Pupils: Pupils are equal, round, and reactive to light.  Cardiovascular:     Rate and Rhythm: Normal rate and regular rhythm.  Pulmonary:     Effort: Pulmonary effort is normal. No respiratory distress.     Breath sounds: Normal breath sounds. No wheezing.  Abdominal:     General: Bowel sounds are normal. There is no distension.     Palpations: Abdomen is soft. There is no mass.     Tenderness: There is no abdominal tenderness. There is no guarding or rebound.  Musculoskeletal:        General: No tenderness. Normal range of motion.     Cervical back: Normal range of motion and neck supple.  Skin:    General: Skin is warm.  Neurological:     Mental Status: She is alert  and oriented to person, place, and time.  Psychiatric:        Mood and Affect: Affect normal.     LABORATORY DATA:  I have reviewed the data as listed Lab Results  Component Value Date   WBC 5.6 09/04/2019   HGB 7.7 (L) 09/04/2019   HCT 23.7 (L) 09/04/2019   MCV 82.9 09/04/2019   PLT 294 09/04/2019   Recent Labs    02/01/19 1156 06/05/19 1304 09/04/19 1249  NA 136 141 135  K 4.5 4.4 4.3  CL 104 104 103  CO2 22 26 21*  GLUCOSE 323* 202* 450*  BUN 23 26* 25*  CREATININE 1.26* 1.13* 1.34*  CALCIUM 9.2 9.8 8.3*  GFRNONAA 42* 48* 39*  GFRAA 49* 55* 45*  PROT 7.2  --   --   ALBUMIN 3.9  --   --   AST 24  --   --   ALT 17  --   --   ALKPHOS 65  --   --   BILITOT 0.6  --   --      VAS [place] ABI WITH/WO TBI  Result Date: 08/13/2019 LOWER EXTREMITY DOPPLER STUDY Indications: Peripheral artery disease.  Vascular Interventions: On 03/08/2017 Aortogram - PTA of the left SFA with                         angioplasty/balloon. Stent placement to the left SFA.                         PTA of the left CIA with angioplasty/balloon.                         10/30/2017 PTA of left SFA and proximal popliteal                         artery. Left SFA and proximal popliteal artery stent.                          10/01/2018:Aortogram and Selective Left Lower Extremity                         Angiogram. Thrombolytic therapy TPA instilled in the  Left SFA and Popliteal Artery. Mechanical Thrombectomy                         to the Left SFA and Popliteal Artery. PTA of the Left                         External Iliac Artery and CFA. PTA of the Left SFA and                         Proximal Popliteal Artery. Stent placement to the                         Popliteal Artery and Distal SFA. Stent placed to the                         Left SFA.                          10/08/2018: Aortogram and Selctive Right Lower Extremity                         Angiogram. Mechanical Thrombectomy of the Right  SFA,                         Popliteal Artery and Posterior tibial Artery. PTA of the                         Right SFA and Proximal Popliteal Artery. Stent placement                         to the Distal SFAand Proximal Popliteal Artery. Stent                         placement to the Right SFA. PTA of the Right Posterior                         Tibial Artery. placement. Comparison Study: 02/05/2019 Performing Technologist: Concha Norway RVT  Examination Guidelines: A complete evaluation includes at minimum, Doppler waveform signals and systolic blood pressure reading at the level of bilateral brachial, anterior tibial, and posterior tibial arteries, when vessel segments are accessible. Bilateral testing is considered an integral part of a complete examination. Photoelectric Plethysmograph (PPG) waveforms and toe systolic pressure readings are included as required and additional duplex testing as needed. Limited examinations for reoccurring indications may be performed as noted.  ABI Findings: +---------+------------------+-----+---------+--------+ Right    Rt Pressure (mmHg)IndexWaveform Comment  +---------+------------------+-----+---------+--------+ Brachial 131                                      +---------+------------------+-----+---------+--------+ ATA      140               1.07 triphasic         +---------+------------------+-----+---------+--------+ PTA      145               1.11 triphasic         +---------+------------------+-----+---------+--------+ Corky Downs  0.62 Normal            +---------+------------------+-----+---------+--------+ +---------+------------------+-----+---------+-------+ Left     Lt Pressure (mmHg)IndexWaveform Comment +---------+------------------+-----+---------+-------+ Brachial 130                                     +---------+------------------+-----+---------+-------+ ATA      133               1.02 triphasic         +---------+------------------+-----+---------+-------+ PTA      132               1.01 triphasic        +---------+------------------+-----+---------+-------+ Great Toe86                0.66 Normal           +---------+------------------+-----+---------+-------+ +-------+-----------+-----------+------------+------------+ ABI/TBIToday's ABIToday's TBIPrevious ABIPrevious TBI +-------+-----------+-----------+------------+------------+ Right  1.11       .62        1.14                     +-------+-----------+-----------+------------+------------+ Left   1.02       .66        1.12                     +-------+-----------+-----------+------------+------------+ Compared to prior study on 02/09/2019.  Summary: Right: Resting right ankle-brachial index is within normal range. No evidence of significant right lower extremity arterial disease. The right toe-brachial index is normal. Left: Resting left ankle-brachial index is within normal range. No evidence of significant left lower extremity arterial disease. The left toe-brachial index is normal.  *See table(s) above for measurements and observations.  Electronically signed by Leotis Pain MD on 08/13/2019 at 5:23:35 PM.    Final    Intravitreal Injection, Pharmacologic Agent - OD - Right Eye  Result Date: 09/02/2019 Time Out 09/02/2019. 2:41 PM. Confirmed correct patient, procedure, site, and patient consented. Anesthesia Topical anesthesia was used. Anesthetic medications included Lidocaine 2%, Proparacaine 0.5%. Procedure Preparation included 5% betadine to ocular surface, eyelid speculum. A (32g) needle was used. Injection: 2 mg aflibercept Alfonse Flavors) SOLN   NDC: A3590391, Lot: [id number], Expiration date: 12/03/2019   Route: Intravitreal, Site: Right Eye, Waste: 0.05 mL Post-op Post injection exam found visual acuity of at least counting fingers. The patient tolerated the procedure well. There were no complications. The patient received  written and verbal post procedure care education. Post injection medications were not given.   OCT, Retina - OU - Both Eyes  Result Date: 09/02/2019 Right Eye Quality was borderline. Central Foveal Thickness: 336. Progression has improved. Findings include abnormal foveal contour, epiretinal membrane, no SRF, no IRF (Interval improvement in cystic changes IT to fovea). Left Eye Quality was good. Central Foveal Thickness: 280. Progression has been stable. Findings include normal foveal contour, no IRF, no SRF (Trace ERM). Notes *Images captured and stored on drive Diagnosis / Impression: OD: interval improvement in cystic changes IT to fovea OS: NFP; no IRF/SRF--stable, trace ERM Clinical management: See below Abbreviations: NFP - Normal foveal profile. CME - cystoid macular edema. PED - pigment epithelial detachment. IRF - intraretinal fluid. SRF - subretinal fluid. EZ - ellipsoid zone. ERM - epiretinal membrane. ORA - outer retinal atrophy. ORT - outer retinal tubulation. SRHM - subretinal hyper-reflective material    Normocytic anemia #Anemia/likely secondary to CKD-III/iron deficiency.  Improved from  9.8 s/p IV infusions.  Worse.   #Today hemoglobin is 7.7; symptomatic; proceed with Venofer today.  Discussed that she will might need post injections down the line if the hemoglobin does not improve.  Monitor closely while on IV iron.  Iron studies today pending.  # Poorly controlled BG- 450 [Dr.Solum]-worse- recommend close follow-up with endocrinology  #Etiology-CKD-III; GFR 43 overall stable.  [facility] nephrology evaluation];   # DISPOSITION: #  Venofer today;  # venofer - 1week # venofer- 2 week # Venofer- 3 week # follow up 2 months- MD; labs- cbc/bmp; possible venofer- Dr.B  All questions were answered. The patient knows to call the clinic with any problems, questions or concerns.    Cammie Sickle, MD 09/04/2019 2:17 PM

## 2019-09-04 NOTE — Telephone Encounter (Signed)
Patient phoned on this date and stated that she would not be able to attend her appt on 09-20-19 [place]. Appt moved to 09-23-19 and patient is aware.

## 2019-09-04 NOTE — Assessment & Plan Note (Addendum)
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  Improved from 9.8 s/p IV infusions.  Worse.   #Today hemoglobin is 7.7; symptomatic; proceed with Venofer today.  Discussed that she will might need post injections down the line if the hemoglobin does not improve.  Monitor closely while on IV iron.  Iron studies today pending.  # Poorly controlled BG- 450 [Dr.Solum]-worse- recommend close follow-up with endocrinology  #Etiology-CKD-III; GFR 43 overall stable.  [facility] nephrology evaluation];   # DISPOSITION: #  Venofer today;  # venofer - 1week # venofer- 2 week # Venofer- 3 week # follow up 2 months- MD; labs- cbc/bmp; possible venofer- Dr.B

## 2019-09-13 ENCOUNTER — Other Ambulatory Visit: Payer: Self-pay

## 2019-09-13 ENCOUNTER — Inpatient Hospital Stay: Payer: Medicare Other

## 2019-09-13 VITALS — BP 158/61 | HR 78 | Temp 98.4°F | Resp 17

## 2019-09-13 DIAGNOSIS — D649 Anemia, unspecified: Secondary | ICD-10-CM | POA: Diagnosis not present

## 2019-09-13 DIAGNOSIS — D631 Anemia in chronic kidney disease: Secondary | ICD-10-CM

## 2019-09-13 MED ORDER — SODIUM CHLORIDE 0.9 % IV SOLN
Freq: Once | INTRAVENOUS | Status: AC
  Filled 2019-09-13: qty 250

## 2019-09-13 MED ORDER — IRON SUCROSE 20 MG/ML IV SOLN
200.0000 mg | Freq: Once | INTRAVENOUS | Status: AC
  Administered 2019-09-13: 200 mg via INTRAVENOUS

## 2019-09-13 NOTE — Progress Notes (Signed)
Pt tolerated infusion well. No s/s of distress or reaction noted. Pt and VS stable at discharge.  

## 2019-09-20 ENCOUNTER — Ambulatory Visit: Payer: PRIVATE HEALTH INSURANCE

## 2019-09-23 ENCOUNTER — Other Ambulatory Visit: Payer: Self-pay

## 2019-09-23 ENCOUNTER — Inpatient Hospital Stay: Payer: Medicare Other

## 2019-09-23 VITALS — BP 145/59 | HR 73 | Temp 98.6°F | Resp 16

## 2019-09-23 DIAGNOSIS — N183 Chronic kidney disease, stage 3 unspecified: Secondary | ICD-10-CM

## 2019-09-23 DIAGNOSIS — D649 Anemia, unspecified: Secondary | ICD-10-CM | POA: Diagnosis not present

## 2019-09-23 MED ORDER — IRON SUCROSE 20 MG/ML IV SOLN
200.0000 mg | Freq: Once | INTRAVENOUS | Status: AC
  Administered 2019-09-23: 200 mg via INTRAVENOUS
  Filled 2019-09-23: qty 10

## 2019-09-23 MED ORDER — SODIUM CHLORIDE 0.9 % IV SOLN
Freq: Once | INTRAVENOUS | Status: AC
  Filled 2019-09-23: qty 250

## 2019-09-27 ENCOUNTER — Inpatient Hospital Stay: Payer: Medicare Other

## 2019-09-27 ENCOUNTER — Other Ambulatory Visit: Payer: Self-pay

## 2019-09-27 VITALS — BP 142/75 | HR 78 | Temp 98.8°F | Resp 18

## 2019-09-27 DIAGNOSIS — N183 Chronic kidney disease, stage 3 unspecified: Secondary | ICD-10-CM

## 2019-09-27 DIAGNOSIS — D649 Anemia, unspecified: Secondary | ICD-10-CM | POA: Diagnosis not present

## 2019-09-27 MED ORDER — SODIUM CHLORIDE 0.9 % IV SOLN
Freq: Once | INTRAVENOUS | Status: AC
  Filled 2019-09-27: qty 250

## 2019-09-27 MED ORDER — IRON SUCROSE 20 MG/ML IV SOLN
200.0000 mg | Freq: Once | INTRAVENOUS | Status: AC
  Administered 2019-09-27: 200 mg via INTRAVENOUS
  Filled 2019-09-27: qty 10

## 2019-10-03 NOTE — Progress Notes (Signed)
[facility] - Clinic Note  10/07/2019     CHIEF COMPLAINT Patient presents for Retina Follow Up   HISTORY OF PRESENT ILLNESS: Cheryl Leonard is a 74 y.o. female who presents to the clinic today for:   HPI    Retina Follow Up    Patient presents with  CRVO/BRVO.  In right eye.  This started 5 weeks ago.  Severity is moderate.  I, the attending physician,  performed the HPI with the patient and updated documentation appropriately.          Comments    Patient here for 5 weeks retina follow up for BRVO OD. Patient states vision doing the same. Nothing different. No eye pain.        Last edited by [name], MD on 10/07/2019  4:45 PM. (History)    Patient states vision the same OU.  Referring physician: Danella Penton, MD [phone] [facility] [address] [facility] [place],  [place] [address]  HISTORICAL INFORMATION:   Selected notes from the MEDICAL RECORD NUMBER Referred by Dr. Senaida Ores for concern of DME OD Lab Results  Component Value Date   HGBA1C 5.9 (H) 10/29/2015       CURRENT MEDICATIONS: Current Outpatient Medications (Ophthalmic Drugs)  Medication Sig  . Aflibercept (EYLEA) 2 MG/0.05ML SOLN 2 mg by Intravitreal route every 5 (five) weeks.    Current Facility-Administered Medications (Ophthalmic Drugs)  Medication Route  . aflibercept (EYLEA) SOLN 2 mg Intravitreal  . aflibercept (EYLEA) SOLN 2 mg Intravitreal  . aflibercept (EYLEA) SOLN 2 mg Intravitreal  . aflibercept (EYLEA) SOLN 2 mg Intravitreal  . aflibercept (EYLEA) SOLN 2 mg Intravitreal   Current Outpatient Medications (Other)  Medication Sig  . acetaminophen (TYLENOL) 500 MG tablet Take 1,000 mg by mouth every 6 (six) hours as needed for moderate pain or headache.  . ALPRAZolam (XANAX) 0.25 MG tablet Take 0.25 mg by mouth daily as needed for anxiety or sleep.   [name] amLODipine (NORVASC) 5 MG tablet Take by mouth.  . Ascorbic Acid (VITAMIN C) 1000 MG tablet  Take 1,000 mg by mouth daily.  [name] aspirin EC 81 MG tablet Take 81 mg by mouth daily.   [name] buPROPion (WELLBUTRIN XL) 150 MG 24 hr tablet Take 150 mg by mouth daily.   [name] buPROPion (WELLBUTRIN XL) 300 MG 24 hr tablet Take 300 mg by mouth daily.  . calcium carbonate (TUMS EX) 750 MG chewable tablet Chew 2,250 mg by mouth daily as needed for heartburn.  . chlorhexidine (PERIDEX) 0.12 % solution Use as directed 15 mLs in the mouth or throat 2 (two) times daily.   . cholecalciferol (VITAMIN D) 1000 units tablet Take 1,000 Units by mouth 2 (two) times daily.  [name] CORAL CALCIUM PO Take 1 tablet by mouth 2 (two) times daily.   [name] denosumab (PROLIA) 60 MG/ML SOLN injection Inject 60 mg into the skin every 6 (six) months.   . DENTA 5000 PLUS 1.1 % CREA dental cream Place 1 application onto teeth 2 (two) times daily. as directed  . DULoxetine (CYMBALTA) 60 MG capsule Take 1 capsule (60 mg total) by mouth 2 (two) times daily.  [name] ELIQUIS 5 MG TABS tablet TAKE 1 TABLET BY MOUTH TWICE A DAY  . famotidine (PEPCID) 40 MG tablet Take 40 mg by mouth 2 (two) times daily.   . ferrous gluconate (FERGON) 324 MG tablet Take 324 mg by mouth daily with breakfast.  . furosemide (LASIX) 20  MG tablet Take 20 mg by mouth daily as needed for fluid or edema.   . gabapentin (NEURONTIN) 100 MG capsule Take 1 capsule by mouth daily.  . Insulin Pen Needle (B-D UF III MINI PEN NEEDLES) 31G X 5 MM MISC once daily.  [name] levothyroxine (SYNTHROID, LEVOTHROID) 100 MCG tablet Take 100 mcg by mouth daily before breakfast.   . lisinopril (PRINIVIL,ZESTRIL) 10 MG tablet Take 10 mg by mouth every morning.  (Patient not taking: Reported on 09/04/2019)  . Magnesium 500 MG TABS Take 500 mg by mouth every morning.   . metoprolol succinate (TOPROL-XL) 50 MG 24 hr tablet Take by mouth.  . mirtazapine (REMERON) 15 MG tablet Take by mouth.  . Multiple Vitamin (MULTIVITAMIN WITH MINERALS) TABS tablet Take 1 tablet by mouth daily.  . pantoprazole (PROTONIX) 40  MG tablet Take 40 mg by mouth every morning.   . Potassium (POTASSIMIN PO) Take 99 mg by mouth daily.  . rosuvastatin (CRESTOR) 20 MG tablet Take 20 mg by mouth every morning.   . TRESIBA FLEXTOUCH 200 UNIT/ML SOPN Inject 30 Units as directed at bedtime.   . TYMLOS 3120 MCG/1.56ML SOPN   . VASCEPA 1 g CAPS Take 1 capsule by mouth 2 (two) times daily.  . vitamin B-12 (CYANOCOBALAMIN) 1000 MCG tablet Take 1,000 mcg by mouth daily.  . vitamin E 400 UNIT capsule Take 400 Units by mouth daily.  [name] zolpidem (AMBIEN) 5 MG tablet Take 1 tablet (5 mg total) by mouth at bedtime. (Patient taking differently: Take 5 mg by mouth at bedtime as needed. )   Current Facility-Administered Medications (Other)  Medication Route  . Bevacizumab (AVASTIN) SOLN 1.25 mg Intravitreal  . Bevacizumab (AVASTIN) SOLN 1.25 mg Intravitreal  . Bevacizumab (AVASTIN) SOLN 1.25 mg Intravitreal  . Bevacizumab (AVASTIN) SOLN 1.25 mg Intravitreal  . insulin aspart (novoLOG) injection 0-15 Units Subcutaneous  . insulin aspart (novoLOG) injection 0-5 Units Subcutaneous      REVIEW OF SYSTEMS: ROS    Positive for: Neurological, Genitourinary, Musculoskeletal, Endocrine, Cardiovascular, Eyes   Negative for: Constitutional, Gastrointestinal, Skin, HENT, Respiratory, Psychiatric, Allergic/Imm, Heme/Lymph   Last edited by Laddie Aquas, COA on 10/07/2019  2:35 PM. (History)       ALLERGIES No Known Allergies  PAST MEDICAL HISTORY Past Medical History:  Diagnosis Date  . Anemia   . Anxiety   . Arthritis    Gout  . Cataracts, both eyes   . Diabetic retinopathy (HCC)    NPDR OU  . Diabetic retinopathy (HCC)   . GERD (gastroesophageal reflux disease)   . Gout   . Headache    h/o migraines  . History of fracture of patella    right knee  . History of positive PPD    Patient always shows positive  . Hyperlipidemia   . Hypertension   . Hypertensive retinopathy    OU  . Hypothyroidism   . Lichen sclerosus  12/30/2013   of vulva  . Metatarsal fracture   . Neuropathy   . Peripheral vascular disease (HCC)   . Polyneuropathy    numbness and tingling in feet and toes  . Renal insufficiency    Stage 3  . Sleep apnea    does not use cpap-lost weight   . Type 2 diabetes mellitus, uncontrolled (HCC)    Past Surgical History:  Procedure Laterality Date  . ABDOMINAL HYSTERECTOMY    . APPENDECTOMY    . BREAST REDUCTION SURGERY  2001  . CATARACT EXTRACTION    .  CESAREAN SECTION  1976  . COLONOSCOPY  03/05/2013   Nml - due for repeat 03/06/2018  . COLONOSCOPY WITH PROPOFOL N/A 03/18/2019   Procedure: COLONOSCOPY WITH PROPOFOL;  Surgeon: Toledo, [name], MD;  Location: [facility];  Service: Gastroenterology;  Laterality: N/A;  . DIAGNOSTIC LAPAROSCOPY    . DILATION AND CURETTAGE OF UTERUS  1989  . ENDARTERECTOMY FEMORAL Bilateral 03/09/2018   Procedure: ENDARTERECTOMY FEMORAL;  Surgeon: Annice Needy, MD;  Location: [facility];  Service: Vascular;  Laterality: Bilateral;  . ENDARTERECTOMY POPLITEAL Left 03/09/2018   Procedure: ENDARTERECTOMY POPLITEAL AND SFA;  Surgeon: Annice Needy, MD;  Location: [facility];  Service: Vascular;  Laterality: Left;  . ESOPHAGOGASTRODUODENOSCOPY  03/05/2013  . ESOPHAGOGASTRODUODENOSCOPY (EGD) WITH PROPOFOL N/A 03/18/2019   Procedure: ESOPHAGOGASTRODUODENOSCOPY (EGD) WITH PROPOFOL;  Surgeon: Toledo, [name], MD;  Location: [facility];  Service: Gastroenterology;  Laterality: N/A;  . EYE SURGERY    . Eyelid Surgery  2012  . INTRAMEDULLARY (IM) NAIL INTERTROCHANTERIC Left 10/30/2015   Procedure: INTRAMEDULLARY (IM) NAIL INTERTROCHANTRIC ;  Surgeon: Kennedy Bucker, MD;  Location: [facility];  Service: Orthopedics;  Laterality: Left;  . KYPHOPLASTY N/A 10/25/2018   Procedure: L4 KYPHOPLASTY;  Surgeon: Kennedy Bucker, MD;  Location: [facility];  Service: Orthopedics;  Laterality: N/A;  . LAPAROSCOPIC HYSTERECTOMY  2000   total  . LOWER EXTREMITY ANGIOGRAPHY Left 03/08/2017    Procedure: LOWER EXTREMITY ANGIOGRAPHY;  Surgeon: Annice Needy, MD;  Location: [facility];  Service: Cardiovascular;  Laterality: Left;  . LOWER EXTREMITY ANGIOGRAPHY Left 10/30/2017   Procedure: LOWER EXTREMITY ANGIOGRAPHY;  Surgeon: Annice Needy, MD;  Location: [facility];  Service: Cardiovascular;  Laterality: Left;  . LOWER EXTREMITY ANGIOGRAPHY Right 03/08/2018   Procedure: LOWER EXTREMITY ANGIOGRAPHY;  Surgeon: Annice Needy, MD;  Location: [facility];  Service: Cardiovascular;  Laterality: Right;  . LOWER EXTREMITY ANGIOGRAPHY Left 10/01/2018   Procedure: LOWER EXTREMITY ANGIOGRAPHY;  Surgeon: Annice Needy, MD;  Location: [facility];  Service: Cardiovascular;  Laterality: Left;  . LOWER EXTREMITY ANGIOGRAPHY Right 10/08/2018   Procedure: LOWER EXTREMITY ANGIOGRAPHY;  Surgeon: Annice Needy, MD;  Location: [facility];  Service: Cardiovascular;  Laterality: Right;  . PERIPHERAL VASCULAR INTERVENTION  03/08/2018   Procedure: PERIPHERAL VASCULAR INTERVENTION;  Surgeon: Annice Needy, MD;  Location: [facility];  Service: Cardiovascular;;  . REDUCTION MAMMAPLASTY  1997  . SACROPLASTY N/A 10/25/2018   Procedure: S1 SACROPLASTY;  Surgeon: Kennedy Bucker, MD;  Location: [facility];  Service: Orthopedics;  Laterality: N/A;    FAMILY HISTORY Family History  Problem Relation Age of Onset  . Coronary artery disease Father   . Heart attack Father   . Coronary artery disease Mother   . Heart attack Mother   . Ovarian cancer Sister 42       sister had hormonal therapy for IVF txs-which increased risk factor for ovarian cancer  . Breast cancer Neg Hx     SOCIAL HISTORY Social History   Tobacco Use  . Smoking status: Former Smoker    Packs/day: 1.00    Years: 20.00    Pack years: 20.00    Types: Cigarettes    Quit date: 03/07/1996    Years since quitting: 23.6  . Smokeless tobacco: Never Used  . Tobacco comment: started smoking at age 29   Vaping Use  . Vaping Use: Never used  Substance Use Topics  . Alcohol use:  No    Alcohol/week: 0.0 standard drinks  . Drug use: No         OPHTHALMIC EXAM:  Base Eye Exam    Visual Acuity (Snellen - Linear)      Right Left   Dist [place] 20/30 -2 20/20 -1   Dist ph [place] NI        Tonometry (Tonopen, 2:32 PM)      Right Left   Pressure 16 15       Pupils      Dark Light Shape React APD   Right 3 2 Round Brisk None   Left 3 2 Round Brisk None       Visual Fields (Counting fingers)      Left Right    Full Full       Extraocular Movement      Right Left    Full, Ortho Full, Ortho       Neuro/Psych    Oriented x3: Yes   Mood/Affect: Normal       Dilation    Both eyes: 1.0% Mydriacyl, 2.5% Phenylephrine @ 2:32 PM        Slit Lamp and Fundus Exam    External Exam      Right Left   External Normal Normal       Slit Lamp Exam      Right Left   Lids/Lashes dermatochalasis dermatochalasis   Conjunctiva/Sclera White and quiet White and quiet   Cornea arcus; well healed cataract wound; 2-3+ diffuse Punctate epithelial erosions, decreased TBUT, mild Anterior basement membrane dystrophy superiorly arcus; well healed cataract wound, 2-3+ diffuse Punctate epithelial erosions, irregualr epi surface, decreased TBUT   Anterior Chamber Deep and quiet Deep and quiet   Iris Round and dilated Round and dilated   Lens PCIOL; open PC PCIOL; open PC   Vitreous syneresis, Posterior vitreous detachment, vitreous condensations inferiorly syneresis, Posterior vitreous detachment       Fundus Exam      Right Left   Disc Superior hyperemia and mild edema, +IRH superior disc - persistent, mild Pallor Pink and Sharp   C/D Ratio 0.5 0.5   Macula Flat, Blunted foveal reflex, cystic changes slightly worse, +Epiretinal membrane, no heme flat; blunted foveal reflex, no heme or edema, small pigment clump IT to fovea   Vessels Vascular attenuation, Tortuous Vascular attenuation   Periphery  attached; MAs/DBH inferior--slightly improved Attached, no heme          IMAGING AND PROCEDURES  Imaging and Procedures for 04/25/17  OCT, Retina - OU - Both Eyes       Right Eye Quality was good. Central Foveal Thickness: 351. Progression has worsened. Findings include abnormal foveal contour, epiretinal membrane, no SRF, intraretinal fluid (Interval redevelopment of IRF/cystic changes).   Left Eye Quality was good. Central Foveal Thickness: 283. Progression has been stable. Findings include normal foveal contour, no IRF, no SRF (Trace ERM).   Notes *Images captured and stored on drive  Diagnosis / Impression:  OD: BRVO --  interval redevelopment of IRF/cystic changes OS: NFP; no IRF/SRF--stable, trace ERM  Clinical management:  See below  Abbreviations: NFP - Normal foveal profile. CME - cystoid macular edema. PED - pigment epithelial detachment. IRF - intraretinal fluid. SRF - subretinal fluid. EZ - ellipsoid zone. ERM - epiretinal membrane. ORA - outer retinal atrophy. ORT - outer retinal tubulation. SRHM - subretinal hyper-reflective material        Intravitreal Injection, Pharmacologic Agent - OD - Right  Eye       Time Out 10/07/2019. 3:17 PM. Confirmed correct patient, procedure, site, and patient consented.   Anesthesia Topical anesthesia was used. Anesthetic medications included Lidocaine 2%, Proparacaine 0.5%.   Procedure Preparation included 5% betadine to ocular surface, eyelid speculum. A (32g) needle was used.   Injection:  2 mg aflibercept Gretta Cool) SOLN   NDC: L6038910, Lot: [id number], Expiration date: 01/03/2020   Route: Intravitreal, Site: Right Eye, Waste: 0.05 mL  Post-op Post injection exam found visual acuity of at least counting fingers. The patient tolerated the procedure well. There were no complications. The patient received written and verbal post procedure care education. Post injection medications were not given.                  ASSESSMENT/PLAN:    ICD-10-CM   1. Branch retinal vein occlusion of right eye with macular edema  H34.8310 Intravitreal Injection, Pharmacologic Agent - OD - Right Eye    aflibercept (EYLEA) SOLN 2 mg  2. Retinal edema  H35.81 OCT, Retina - OU - Both Eyes  3. Both eyes affected by mild nonproliferative diabetic retinopathy with macular edema, associated with type 2 diabetes mellitus (HCC)  U98.1191   4. Essential hypertension  I10   5. Hypertensive retinopathy of both eyes  H35.033   6. Epiretinal membrane (ERM) of right eye  H35.371   7. Pseudophakia of both eyes  Z96.1     1,2. BRVO w/ CME OD  - by history, pt states symptoms first noticed 2 wks prior to presentation, but reports changes may have occurred prior  - initial exam with differential tortuosity of vessels (OD > OS)  - FA (02.10.20) shows mild late staining / leakage in macula, staining / leakage of disc -- improving CME  - differential includes DM2 (DME), hypertensive retinopathy, inflammatory etiology / uveitis  - S/P IVA OD #1 (02.08.19), #2 (03.11.19), #3 (04.09.19), #4 (05.20.19), #5 (02.10.20)  - gave IVA OD on 2.10.20 due to pending Eylea4U for 2020 -- resulted in increased IRF/CME  - review of OCTs show persistent IRF and cystic changes --  resistance to IVA   - June 2019 -- switched therapies: S/P IVE OD #1 (06.24.19), #2 (07.24.19), #3 (09.04.19), #4 (10.30.19),#5 (12.30.19), #6 (03.23.20), #7 (05.05.20), #8 (07.16.20), #9 (07.17.20), #10 (08.28.20), #11 (10.13.20), # 12 (11.17.20), #13 (2.8.21), #14 (03.09.21), #15 (04.13.21), #16 (05.11.21), #17 (06.17.21), #18 (07.23.21), #19 (08.30.21)  - OCT today shows interval redevelopment of IRF/cystic changes OD  - BCVA 20/30 -- stable  - Eylea4U benefits investigation for 2021 completed and pt approved for IVE  - recommend IVE OD #20 today, 10.04.21  - RBA of procedure discussed, questions answered  - informed consent obtained  - see procedure note  - Eylea  informed consent form obtained and scanned on 11.19.2020  - F/U 5 weeks  -- DFE/OCT/possible injection  3. Mild nonproliferative diabetic retinopathy, both eyes  - The incidence, risk factors for progression, natural history and treatment options for diabetic retinopathy were discussed with patient.    - The need for close monitoring of blood glucose, blood pressure, and serum lipids, avoiding cigarette or any type of tobacco, and the need for long term follow up was also discussed with patient.  - could be contributing to CME OD  - OS with minimal diabetic retinopathy  - continue to monitor  4,5. Hypertensive retinopathy OU - stable  - as above, may have contributing to CME OD  - discussed importance of  tight BP control  - monitor  6. Epiretinal membrane, right eye   - stable nasal ERM  - no indication for surgery at this time  7. Pseudophakia OU  - s/p CE/IOL OU by cataract surgeon in [facility]  - doing well  - monitor    Ophthalmic Meds Ordered this visit:  Meds ordered this encounter  Medications  . aflibercept (EYLEA) SOLN 2 mg       Return 5 weeks, for DFE, OCT.  There are no Patient Instructions on file for this visit.  This document serves as a record of services personally performed by Karie Chimera, MD, PhD. It was created on their behalf by Annalee Genta, COMT. The creation of this record is the provider's dictation and/or activities during the visit.  Electronically signed by: Annalee Genta, COMT 10/07/19 4:47 PM  Karie Chimera, M.D., Ph.D. Diseases & Surgery of the Retina and Vitreous [facility] 10/07/2019   I have reviewed the above documentation for accuracy and completeness, and I agree with the above. Karie Chimera, M.D., Ph.D. 10/07/19 4:47 PM     Abbreviations: M myopia (nearsighted); A astigmatism; H hyperopia (farsighted); P presbyopia; Mrx spectacle prescription;  CTL contact lenses; OD right eye; OS left eye; OU both  eyes  XT exotropia; ET esotropia; PEK punctate epithelial keratitis; PEE punctate epithelial erosions; DES dry eye syndrome; MGD meibomian gland dysfunction; ATs artificial tears; PFAT's preservative free artificial tears; NSC nuclear sclerotic cataract; PSC posterior subcapsular cataract; ERM epi-retinal membrane; PVD posterior vitreous detachment; RD retinal detachment; DM diabetes mellitus; DR diabetic retinopathy; NPDR non-proliferative diabetic retinopathy; PDR proliferative diabetic retinopathy; CSME clinically significant macular edema; DME diabetic macular edema; dbh dot blot hemorrhages; CWS cotton wool spot; POAG primary open angle glaucoma; C/D cup-to-disc ratio; HVF humphrey visual field; GVF goldmann visual field; OCT optical coherence tomography; IOP intraocular pressure; BRVO Branch retinal vein occlusion; CRVO central retinal vein occlusion; CRAO central retinal artery occlusion; BRAO branch retinal artery occlusion; RT retinal tear; SB scleral buckle; PPV pars plana vitrectomy; VH Vitreous hemorrhage; PRP panretinal laser photocoagulation; IVK intravitreal kenalog; VMT vitreomacular traction; MH Macular hole;  NVD neovascularization of the disc; NVE neovascularization elsewhere; AREDS age related eye disease study; ARMD age related macular degeneration; POAG primary open angle glaucoma; EBMD epithelial/anterior basement membrane dystrophy; ACIOL anterior chamber intraocular lens; IOL intraocular lens; PCIOL posterior chamber intraocular lens; Phaco/IOL phacoemulsification with intraocular lens placement; PRK photorefractive keratectomy; LASIK laser assisted in situ keratomileusis; HTN hypertension; DM diabetes mellitus; COPD chronic obstructive pulmonary disease

## 2019-10-07 ENCOUNTER — Ambulatory Visit (INDEPENDENT_AMBULATORY_CARE_PROVIDER_SITE_OTHER): Payer: Medicare Other | Admitting: Ophthalmology

## 2019-10-07 ENCOUNTER — Encounter (INDEPENDENT_AMBULATORY_CARE_PROVIDER_SITE_OTHER): Payer: Self-pay | Admitting: Ophthalmology

## 2019-10-07 ENCOUNTER — Other Ambulatory Visit: Payer: Self-pay

## 2019-10-07 DIAGNOSIS — H34831 Tributary (branch) retinal vein occlusion, right eye, with macular edema: Secondary | ICD-10-CM | POA: Diagnosis not present

## 2019-10-07 DIAGNOSIS — E113213 Type 2 diabetes mellitus with mild nonproliferative diabetic retinopathy with macular edema, bilateral: Secondary | ICD-10-CM

## 2019-10-07 DIAGNOSIS — H3581 Retinal edema: Secondary | ICD-10-CM

## 2019-10-07 DIAGNOSIS — I10 Essential (primary) hypertension: Secondary | ICD-10-CM | POA: Diagnosis not present

## 2019-10-07 DIAGNOSIS — H35371 Puckering of macula, right eye: Secondary | ICD-10-CM

## 2019-10-07 DIAGNOSIS — H35033 Hypertensive retinopathy, bilateral: Secondary | ICD-10-CM

## 2019-10-07 DIAGNOSIS — Z961 Presence of intraocular lens: Secondary | ICD-10-CM

## 2019-10-07 MED ORDER — AFLIBERCEPT 2MG/0.05ML IZ SOLN FOR KALEIDOSCOPE
2.0000 mg | INTRAVITREAL | Status: AC | PRN
  Administered 2019-10-07: 2 mg via INTRAVITREAL

## 2019-10-30 ENCOUNTER — Other Ambulatory Visit (HOSPITAL_COMMUNITY): Payer: Self-pay | Admitting: Nephrology

## 2019-10-30 ENCOUNTER — Other Ambulatory Visit: Payer: Self-pay | Admitting: Nephrology

## 2019-10-30 DIAGNOSIS — E875 Hyperkalemia: Secondary | ICD-10-CM | POA: Insufficient documentation

## 2019-10-30 DIAGNOSIS — I129 Hypertensive chronic kidney disease with stage 1 through stage 4 chronic kidney disease, or unspecified chronic kidney disease: Secondary | ICD-10-CM | POA: Insufficient documentation

## 2019-10-30 DIAGNOSIS — E1122 Type 2 diabetes mellitus with diabetic chronic kidney disease: Secondary | ICD-10-CM

## 2019-10-30 DIAGNOSIS — N1832 Chronic kidney disease, stage 3b: Secondary | ICD-10-CM

## 2019-10-30 DIAGNOSIS — D649 Anemia, unspecified: Secondary | ICD-10-CM

## 2019-10-30 DIAGNOSIS — R809 Proteinuria, unspecified: Secondary | ICD-10-CM | POA: Insufficient documentation

## 2019-11-06 ENCOUNTER — Inpatient Hospital Stay: Payer: Medicare Other | Attending: Internal Medicine

## 2019-11-06 ENCOUNTER — Inpatient Hospital Stay: Payer: Medicare Other

## 2019-11-06 ENCOUNTER — Inpatient Hospital Stay (HOSPITAL_BASED_OUTPATIENT_CLINIC_OR_DEPARTMENT_OTHER): Payer: Medicare Other | Admitting: Internal Medicine

## 2019-11-06 ENCOUNTER — Other Ambulatory Visit: Payer: Self-pay

## 2019-11-06 VITALS — BP 149/66 | HR 68 | Resp 18

## 2019-11-06 DIAGNOSIS — Z87891 Personal history of nicotine dependence: Secondary | ICD-10-CM | POA: Insufficient documentation

## 2019-11-06 DIAGNOSIS — I10 Essential (primary) hypertension: Secondary | ICD-10-CM | POA: Diagnosis not present

## 2019-11-06 DIAGNOSIS — Z8041 Family history of malignant neoplasm of ovary: Secondary | ICD-10-CM | POA: Diagnosis not present

## 2019-11-06 DIAGNOSIS — D631 Anemia in chronic kidney disease: Secondary | ICD-10-CM

## 2019-11-06 DIAGNOSIS — I7025 Atherosclerosis of native arteries of other extremities with ulceration: Secondary | ICD-10-CM

## 2019-11-06 DIAGNOSIS — N183 Chronic kidney disease, stage 3 unspecified: Secondary | ICD-10-CM | POA: Insufficient documentation

## 2019-11-06 DIAGNOSIS — R5383 Other fatigue: Secondary | ICD-10-CM | POA: Diagnosis not present

## 2019-11-06 DIAGNOSIS — R0602 Shortness of breath: Secondary | ICD-10-CM | POA: Diagnosis not present

## 2019-11-06 DIAGNOSIS — E119 Type 2 diabetes mellitus without complications: Secondary | ICD-10-CM | POA: Diagnosis not present

## 2019-11-06 DIAGNOSIS — M2550 Pain in unspecified joint: Secondary | ICD-10-CM | POA: Insufficient documentation

## 2019-11-06 DIAGNOSIS — D649 Anemia, unspecified: Secondary | ICD-10-CM | POA: Diagnosis not present

## 2019-11-06 DIAGNOSIS — D509 Iron deficiency anemia, unspecified: Secondary | ICD-10-CM | POA: Insufficient documentation

## 2019-11-06 DIAGNOSIS — Z79899 Other long term (current) drug therapy: Secondary | ICD-10-CM | POA: Insufficient documentation

## 2019-11-06 DIAGNOSIS — F330 Major depressive disorder, recurrent, mild: Secondary | ICD-10-CM | POA: Insufficient documentation

## 2019-11-06 DIAGNOSIS — Z7901 Long term (current) use of anticoagulants: Secondary | ICD-10-CM | POA: Insufficient documentation

## 2019-11-06 DIAGNOSIS — Z9049 Acquired absence of other specified parts of digestive tract: Secondary | ICD-10-CM | POA: Insufficient documentation

## 2019-11-06 DIAGNOSIS — Z8249 Family history of ischemic heart disease and other diseases of the circulatory system: Secondary | ICD-10-CM | POA: Insufficient documentation

## 2019-11-06 DIAGNOSIS — E785 Hyperlipidemia, unspecified: Secondary | ICD-10-CM | POA: Insufficient documentation

## 2019-11-06 LAB — BASIC METABOLIC PANEL
Anion gap: 9 (ref 5–15)
BUN: 32 mg/dL — ABNORMAL HIGH (ref 8–23)
CO2: 26 mmol/L (ref 22–32)
Calcium: 9.9 mg/dL (ref 8.9–10.3)
Chloride: 103 mmol/L (ref 98–111)
Creatinine, Ser: 1.14 mg/dL — ABNORMAL HIGH (ref 0.44–1.00)
GFR, Estimated: 51 mL/min — ABNORMAL LOW (ref >60)
Glucose, Bld: 154 mg/dL — ABNORMAL HIGH (ref 70–99)
Potassium: 4.6 mmol/L (ref 3.5–5.1)
Sodium: 138 mmol/L (ref 135–145)

## 2019-11-06 LAB — CBC WITH DIFFERENTIAL/PLATELET
Abs Immature Granulocytes: 0.03 10*3/uL (ref 0.00–0.07)
Basophils Absolute: 0.1 10*3/uL (ref 0.0–0.1)
Basophils Relative: 1 %
Eosinophils Absolute: 0.3 10*3/uL (ref 0.0–0.5)
Eosinophils Relative: 4 %
HCT: 32.5 % — ABNORMAL LOW (ref 36.0–46.0)
Hemoglobin: 10.6 g/dL — ABNORMAL LOW (ref 12.0–15.0)
Immature Granulocytes: 0 %
Lymphocytes Relative: 26 %
Lymphs Abs: 1.8 10*3/uL (ref 0.7–4.0)
MCH: 27.7 pg (ref 26.0–34.0)
MCHC: 32.6 g/dL (ref 30.0–36.0)
MCV: 85.1 fL (ref 80.0–100.0)
Monocytes Absolute: 0.7 10*3/uL (ref 0.1–1.0)
Monocytes Relative: 10 %
Neutro Abs: 4.2 10*3/uL (ref 1.7–7.7)
Neutrophils Relative %: 59 %
Platelets: 286 10*3/uL (ref 150–400)
RBC: 3.82 MIL/uL — ABNORMAL LOW (ref 3.87–5.11)
RDW: 16.6 % — ABNORMAL HIGH (ref 11.5–15.5)
WBC: 7.1 10*3/uL (ref 4.0–10.5)
nRBC: 0 % (ref 0.0–0.2)

## 2019-11-06 MED ORDER — SODIUM CHLORIDE 0.9 % IV SOLN
Freq: Once | INTRAVENOUS | Status: AC
  Filled 2019-11-06: qty 250

## 2019-11-06 MED ORDER — IRON SUCROSE 20 MG/ML IV SOLN
200.0000 mg | Freq: Once | INTRAVENOUS | Status: AC
  Administered 2019-11-06: 200 mg via INTRAVENOUS
  Filled 2019-11-06: qty 10

## 2019-11-06 NOTE — Progress Notes (Signed)
Patient tolerated infusion well. Patient and VSS. Discharged home  

## 2019-11-06 NOTE — Patient Instructions (Signed)
Iron sulfate 325 mg [elemental iron-65mg ]; once a day.

## 2019-11-06 NOTE — Progress Notes (Signed)
[facility] NOTE  Patient Care Team: Rusty Aus, MD as PCP - General (Internal Medicine) Josefine Class, MD as Referring Physician (Gastroenterology) Cammie Sickle, MD as Consulting Physician (Hematology and Oncology)  CHIEF COMPLAINTS/PURPOSE OF CONSULTATION: Anemia  HEMATOLOGY HISTORY  # ANEMIA- Jan 2021- 8.8/ferritin 11 [PCP]; N-WBC/platelets? IDA vs other- EGD-2015/colonoscopy-? 2015; 2020- [Dr.Skulskie] ; capsule-2016- ? 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  female anemia iron deficiency-question CKD-III s here for follow-up.   In the interim evaluated by nephrology-awaiting a kidney ultrasound.  Patient is currently status post IV iron infusion.  Notes her energy levels are improved.   Shortness of breath especially exertion.  She denies any blood in stools or black-colored stools.  No nausea no vomiting.  Review of Systems  Constitutional: Positive for malaise/fatigue. Negative for chills, diaphoresis and fever.  HENT: Negative for nosebleeds and sore throat.   Eyes: Negative for double vision.  Respiratory: Positive for shortness of breath. Negative for cough, hemoptysis, sputum production and wheezing.   Cardiovascular: Negative for chest pain, palpitations, orthopnea and leg swelling.  Gastrointestinal: Negative for abdominal pain, blood in stool, constipation, diarrhea, heartburn, melena, nausea and vomiting.  Genitourinary: Negative for dysuria, frequency and urgency.  Musculoskeletal: Positive for joint pain.  Skin: Negative.  Negative for itching and rash.  Neurological: Negative for dizziness, tingling, focal weakness, weakness and headaches.  Endo/Heme/Allergies: Does not bruise/bleed easily.  Psychiatric/Behavioral: Negative for depression. The  patient is not nervous/anxious and does not have insomnia.     MEDICAL HISTORY:  Past Medical History:  Diagnosis Date  . Anemia   . Anxiety   . Arthritis    Gout  . Cataracts, both eyes   . Diabetic retinopathy ([place])    NPDR OU  . Diabetic retinopathy ([place])   . GERD (gastroesophageal reflux disease)   . Gout   . Headache    h/o migraines  . History of fracture of patella    right knee  . History of positive PPD    Patient always shows positive  . Hyperlipidemia   . Hypertension   . Hypertensive retinopathy    OU  . Hypothyroidism   . Lichen sclerosus 01/75/1025   of vulva  . Metatarsal fracture   . Neuropathy   . Peripheral vascular disease ([place])   . Polyneuropathy    numbness and tingling in feet and toes  . Renal insufficiency    Stage 3  . Sleep apnea    does not use cpap-lost weight   . Type 2 diabetes mellitus, uncontrolled ([place])     SURGICAL HISTORY: Past Surgical History:  Procedure Laterality Date  . ABDOMINAL HYSTERECTOMY    . APPENDECTOMY    . BREAST REDUCTION SURGERY  2001  . CATARACT EXTRACTION    . CESAREAN SECTION  1976  . COLONOSCOPY  03/05/2013   Nml - due for repeat 03/06/2018  . COLONOSCOPY WITH PROPOFOL N/A 03/18/2019   Procedure: COLONOSCOPY WITH PROPOFOL;  Surgeon: Toledo, Benay Pike, MD;  Location: [facility];  Service: Gastroenterology;  Laterality: N/A;  . DIAGNOSTIC LAPAROSCOPY    . DILATION AND CURETTAGE OF UTERUS  1989  . ENDARTERECTOMY FEMORAL Bilateral 03/09/2018   Procedure: ENDARTERECTOMY FEMORAL;  Surgeon: Algernon Huxley, MD;  Location: [facility];  Service: Vascular;  Laterality: Bilateral;  .  ENDARTERECTOMY POPLITEAL Left 03/09/2018   Procedure: ENDARTERECTOMY POPLITEAL AND SFA;  Surgeon: Algernon Huxley, MD;  Location: [facility] ORS;  Service: Vascular;  Laterality: Left;  . ESOPHAGOGASTRODUODENOSCOPY  03/05/2013  . ESOPHAGOGASTRODUODENOSCOPY (EGD) WITH PROPOFOL N/A 03/18/2019   Procedure: ESOPHAGOGASTRODUODENOSCOPY (EGD) WITH PROPOFOL;   Surgeon: Toledo, Benay Pike, MD;  Location: [facility];  Service: Gastroenterology;  Laterality: N/A;  . EYE SURGERY    . Eyelid Surgery  2012  . INTRAMEDULLARY (IM) NAIL INTERTROCHANTERIC Left 10/30/2015   Procedure: INTRAMEDULLARY (IM) NAIL INTERTROCHANTRIC ;  Surgeon: Hessie Knows, MD;  Location: [facility];  Service: Orthopedics;  Laterality: Left;  . KYPHOPLASTY N/A 10/25/2018   Procedure: L4 KYPHOPLASTY;  Surgeon: Hessie Knows, MD;  Location: [facility];  Service: Orthopedics;  Laterality: N/A;  . LAPAROSCOPIC HYSTERECTOMY  2000   total  . LOWER EXTREMITY ANGIOGRAPHY Left 03/08/2017   Procedure: LOWER EXTREMITY ANGIOGRAPHY;  Surgeon: Algernon Huxley, MD;  Location: [place] [facility];  Service: Cardiovascular;  Laterality: Left;  . LOWER EXTREMITY ANGIOGRAPHY Left 10/30/2017   Procedure: LOWER EXTREMITY ANGIOGRAPHY;  Surgeon: Algernon Huxley, MD;  Location: [place] [facility];  Service: Cardiovascular;  Laterality: Left;  . LOWER EXTREMITY ANGIOGRAPHY Right 03/08/2018   Procedure: LOWER EXTREMITY ANGIOGRAPHY;  Surgeon: Algernon Huxley, MD;  Location: [facility];  Service: Cardiovascular;  Laterality: Right;  . LOWER EXTREMITY ANGIOGRAPHY Left 10/01/2018   Procedure: LOWER EXTREMITY ANGIOGRAPHY;  Surgeon: Algernon Huxley, MD;  Location: [place] [facility];  Service: Cardiovascular;  Laterality: Left;  . LOWER EXTREMITY ANGIOGRAPHY Right 10/08/2018   Procedure: LOWER EXTREMITY ANGIOGRAPHY;  Surgeon: Algernon Huxley, MD;  Location: [place] [facility];  Service: Cardiovascular;  Laterality: Right;  . PERIPHERAL VASCULAR INTERVENTION  03/08/2018   Procedure: PERIPHERAL VASCULAR INTERVENTION;  Surgeon: Algernon Huxley, MD;  Location: [place] [facility];  Service: Cardiovascular;;  . REDUCTION MAMMAPLASTY  1997  . SACROPLASTY N/A 10/25/2018   Procedure: S1 SACROPLASTY;  Surgeon: Hessie Knows, MD;  Location: [facility];  Service: Orthopedics;  Laterality: N/A;    SOCIAL HISTORY: Social History    Socioeconomic History  . Marital status: Married    Spouse name: Jenny Reichmann  . Number of children: 3  . Years of education: Not on file  . Highest education level: Not on file  Occupational History  . Occupation: Retail banker  Tobacco Use  . Smoking status: Former Smoker    Packs/day: 1.00    Years: 20.00    Pack years: 20.00    Types: Cigarettes    Quit date: 03/07/1996    Years since quitting: 23.6  . Smokeless tobacco: Never Used  . Tobacco comment: started smoking at age 72  Vaping Use  . Vaping Use: Never used  Substance and Sexual Activity  . Alcohol use: No    Alcohol/week: 0.0 standard drinks  . Drug use: No  . Sexual activity: Yes    Partners: Male    Birth control/protection: Surgical  Other Topics Concern  . Not on file  Social History Narrative   Lives in [place]; with husband; quit > 20 years; no alcohol; used to work at [employer] at [employer].    Social Determinants of Health   Financial Resource Strain:   . Difficulty of Paying Living Expenses: Not on file  Food Insecurity:   . Worried About Charity fundraiser in the Last Year: Not on file  . Ran Out of Food in the Last Year: Not on  file  Transportation Needs:   . Film/video editor (Medical): Not on file  . Lack of Transportation (Non-Medical): Not on file  Physical Activity:   . Days of Exercise per Week: Not on file  . Minutes of Exercise per Session: Not on file  Stress:   . Feeling of Stress : Not on file  Social Connections:   . Frequency of Communication with Friends and Family: Not on file  . Frequency of Social Gatherings with Friends and Family: Not on file  . Attends Religious Services: Not on file  . Active Member of Clubs or Organizations: Not on file  . Attends Archivist Meetings: Not on file  . Marital Status: Not on file  Intimate Partner Violence:   . Fear of Current or Ex-Partner: Not on file  . Emotionally Abused: Not on file  . Physically Abused: Not on file   . Sexually Abused: Not on file    FAMILY HISTORY: Family History  Problem Relation Age of Onset  . Coronary artery disease Father   . Heart attack Father   . Coronary artery disease Mother   . Heart attack Mother   . Ovarian cancer Sister 51       sister had hormonal therapy for IVF txs-which increased risk factor for ovarian cancer  . Breast cancer Neg Hx     ALLERGIES:  is allergic to ace inhibitors.  MEDICATIONS:  Current Outpatient Medications  Medication Sig Dispense Refill  . acetaminophen (TYLENOL) 500 MG tablet Take 1,000 mg by mouth every 6 (six) hours as needed for moderate pain or headache.    . Aflibercept (EYLEA) 2 MG/0.05ML SOLN 2 mg by Intravitreal route every 5 (five) weeks.     . ALPRAZolam (XANAX) 0.25 MG tablet Take 0.25 mg by mouth daily as needed for anxiety or sleep.     [name] amLODipine (NORVASC) 5 MG tablet Take by mouth.    . Ascorbic Acid (VITAMIN C) 1000 MG tablet Take 1,000 mg by mouth daily.    [name] aspirin EC 81 MG tablet Take 81 mg by mouth daily.     [name] buPROPion (WELLBUTRIN XL) 150 MG 24 hr tablet Take 150 mg by mouth daily.     [name] buPROPion (WELLBUTRIN XL) 300 MG 24 hr tablet Take 300 mg by mouth daily.    . calcium carbonate (TUMS EX) 750 MG chewable tablet Chew 2,250 mg by mouth daily as needed for heartburn.    . chlorhexidine (PERIDEX) 0.12 % solution Use as directed 15 mLs in the mouth or throat 2 (two) times daily.   0  . cholecalciferol (VITAMIN D) 1000 units tablet Take 1,000 Units by mouth 2 (two) times daily.    [name] CORAL CALCIUM PO Take 1 tablet by mouth 2 (two) times daily.     [name] denosumab (PROLIA) 60 MG/ML SOLN injection Inject 60 mg into the skin every 6 (six) months.     . DENTA 5000 PLUS 1.1 % CREA dental cream Place 1 application onto teeth 2 (two) times daily. as directed  99  . DULoxetine (CYMBALTA) 60 MG capsule Take 1 capsule (60 mg total) by mouth 2 (two) times daily.  3  . ELIQUIS 5 MG TABS tablet TAKE 1 TABLET BY MOUTH TWICE A DAY 60  tablet 2  . famotidine (PEPCID) 40 MG tablet Take 40 mg by mouth 2 (two) times daily.     . ferrous gluconate (FERGON) 324 MG tablet Take 324 mg by mouth daily  with breakfast.    . furosemide (LASIX) 20 MG tablet Take 20 mg by mouth daily as needed for fluid or edema.     . gabapentin (NEURONTIN) 100 MG capsule Take 1 capsule by mouth daily.    . Insulin Pen Needle (B-D UF III MINI PEN NEEDLES) 31G X 5 MM MISC once daily.    [name] levothyroxine (SYNTHROID, LEVOTHROID) 100 MCG tablet Take 100 mcg by mouth daily before breakfast.   3  . lisinopril (PRINIVIL,ZESTRIL) 10 MG tablet Take 10 mg by mouth every morning.     . Magnesium 500 MG TABS Take 500 mg by mouth every morning.     . metoprolol succinate (TOPROL-XL) 50 MG 24 hr tablet Take by mouth.    . mirtazapine (REMERON) 15 MG tablet Take by mouth.    . Multiple Vitamin (MULTIVITAMIN WITH MINERALS) TABS tablet Take 1 tablet by mouth daily.    . pantoprazole (PROTONIX) 40 MG tablet Take 40 mg by mouth every morning.     . Potassium (POTASSIMIN PO) Take 99 mg by mouth daily.    . rosuvastatin (CRESTOR) 20 MG tablet Take 20 mg by mouth every morning.     . TRESIBA FLEXTOUCH 200 UNIT/ML SOPN Inject 30 Units as directed at bedtime.   5  . VASCEPA 1 g CAPS Take 1 capsule by mouth 2 (two) times daily.    . vitamin B-12 (CYANOCOBALAMIN) 1000 MCG tablet Take 1,000 mcg by mouth daily.    . vitamin E 400 UNIT capsule Take 400 Units by mouth daily.    [name] zolpidem (AMBIEN) 5 MG tablet Take 1 tablet (5 mg total) by mouth at bedtime. (Patient taking differently: Take 5 mg by mouth at bedtime as needed. ) 30 tablet 0   Current Facility-Administered Medications  Medication Dose Route Frequency Provider Last Rate Last Admin  . aflibercept (EYLEA) SOLN 2 mg  2 mg Intravitreal  Bernarda Caffey, MD   2 mg at 06/27/17 1307  . aflibercept (EYLEA) SOLN 2 mg  2 mg Intravitreal  Bernarda Caffey, MD   2 mg at 07/26/17 1702  . aflibercept (EYLEA) SOLN 2 mg  2 mg Intravitreal   Bernarda Caffey, MD   2 mg at 09/06/17 1700  . aflibercept (EYLEA) SOLN 2 mg  2 mg Intravitreal  Bernarda Caffey, MD   2 mg at 11/01/17 1550  . aflibercept (EYLEA) SOLN 2 mg  2 mg Intravitreal  Bernarda Caffey, MD   2 mg at 01/03/18 1416  . Bevacizumab (AVASTIN) SOLN 1.25 mg  1.25 mg Intravitreal  Bernarda Caffey, MD   1.25 mg at 02/10/17 0953  . Bevacizumab (AVASTIN) SOLN 1.25 mg  1.25 mg Intravitreal  Bernarda Caffey, MD   1.25 mg at 03/13/17 1717  . Bevacizumab (AVASTIN) SOLN 1.25 mg  1.25 mg Intravitreal  Bernarda Caffey, MD   1.25 mg at 04/11/17 1626  . Bevacizumab (AVASTIN) SOLN 1.25 mg  1.25 mg Intravitreal  Bernarda Caffey, MD   1.25 mg at 02/12/18 2154  . insulin aspart (novoLOG) injection 0-15 Units  0-15 Units Subcutaneous TID WC [name], [name], PA-C      . insulin aspart (novoLOG) injection 0-5 Units  0-5 Units Subcutaneous QHS [name], [name], PA-C          PHYSICAL EXAMINATION:   Vitals:   11/06/19 1300 11/06/19 1310  BP: (!) 144/62   Pulse: 71   Resp: 20   Temp: 99.3 F (37.4 C)   SpO2:  98%  Filed Weights   11/06/19 1310  Weight: 164 lb 3.2 oz (74.5 kg)    Physical Exam HENT:     Head: Normocephalic and atraumatic.     Mouth/Throat:     Pharynx: No oropharyngeal exudate.  Eyes:     Pupils: Pupils are equal, round, and reactive to light.  Cardiovascular:     Rate and Rhythm: Normal rate and regular rhythm.  Pulmonary:     Effort: Pulmonary effort is normal. No respiratory distress.     Breath sounds: Normal breath sounds. No wheezing.  Abdominal:     General: Bowel sounds are normal. There is no distension.     Palpations: Abdomen is soft. There is no mass.     Tenderness: There is no abdominal tenderness. There is no guarding or rebound.  Musculoskeletal:        General: No tenderness. Normal range of motion.     Cervical back: Normal range of motion and neck supple.  Skin:    General: Skin is warm.  Neurological:     Mental Status: She is alert and  oriented to person, place, and time.  Psychiatric:        Mood and Affect: Affect normal.     LABORATORY DATA:  I have reviewed the data as listed Lab Results  Component Value Date   WBC 7.1 11/06/2019   HGB 10.6 (L) 11/06/2019   HCT 32.5 (L) 11/06/2019   MCV 85.1 11/06/2019   PLT 286 11/06/2019   Recent Labs    02/01/19 1156 02/01/19 1156 06/05/19 1304 09/04/19 1249 11/06/19 1235  NA 136   < > 141 135 138  K 4.5   < > 4.4 4.3 4.6  CL 104   < > 104 103 103  CO2 22   < > 26 21* 26  GLUCOSE 323*   < > 202* 450* 154*  BUN 23   < > 26* 25* 32*  CREATININE 1.26*   < > 1.13* 1.34* 1.14*  CALCIUM 9.2   < > 9.8 8.3* 9.9  GFRNONAA 42*   < > 48* 39* 51*  GFRAA 49*  --  55* 45*  --   PROT 7.2  --   --   --   --   ALBUMIN 3.9  --   --   --   --   AST 24  --   --   --   --   ALT 17  --   --   --   --   ALKPHOS 65  --   --   --   --   BILITOT 0.6  --   --   --   --    < > = values in this interval not displayed.     Intravitreal Injection, Pharmacologic Agent - OD - Right Eye  Result Date: 10/07/2019 Time Out 10/07/2019. 3:17 PM. Confirmed correct patient, procedure, site, and patient consented. Anesthesia Topical anesthesia was used. Anesthetic medications included Lidocaine 2%, Proparacaine 0.5%. Procedure Preparation included 5% betadine to ocular surface, eyelid speculum. A (32g) needle was used. Injection: 2 mg aflibercept Alfonse Flavors) SOLN   NDC: [id number], Lot: [id number], Expiration date: 01/03/2020   Route: Intravitreal, Site: Right Eye, Waste: 0.05 mL Post-op Post injection exam found visual acuity of at least counting fingers. The patient tolerated the procedure well. There were no complications. The patient received written and verbal post procedure care education. Post injection medications were not given.   OCT, Retina -  OU - Both Eyes  Result Date: 10/07/2019 Right Eye Quality was good. Central Foveal Thickness: 351. Progression has worsened. Findings include abnormal  foveal contour, epiretinal membrane, no SRF, intraretinal fluid (Interval redevelopment of IRF/cystic changes). Left Eye Quality was good. Central Foveal Thickness: 283. Progression has been stable. Findings include normal foveal contour, no IRF, no SRF (Trace ERM). Notes *Images captured and stored on drive Diagnosis / Impression: OD: BRVO --  interval redevelopment of IRF/cystic changes OS: NFP; no IRF/SRF--stable, trace ERM Clinical management: See below Abbreviations: NFP - Normal foveal profile. CME - cystoid macular edema. PED - pigment epithelial detachment. IRF - intraretinal fluid. SRF - subretinal fluid. EZ - ellipsoid zone. ERM - epiretinal membrane. ORA - outer retinal atrophy. ORT - outer retinal tubulation. SRHM - subretinal hyper-reflective material    Normocytic anemia # Anemia/likely secondary to CKD-III/iron deficiency.  Improved from 9.2 s/p IV infusions.  Proceed with Venofer today.  # Poorly controlled BG- 145 [Dr.Solum]-STABLE.   # Etiology-CKD-III; GFR 43 overall stable.  [Dr.Kolluru];   # DISPOSITION: #  Venofer today;  # 1 month- lab-cbc- Venofer # follow up 3 months- MD; labs- cbc/bmp; iron studies/ferritin-; possible venofer- Dr.B  All questions were answered. The patient knows to call the clinic with any problems, questions or concerns.    Cammie Sickle, MD 11/06/2019 2:00 PM

## 2019-11-06 NOTE — Assessment & Plan Note (Addendum)
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  Improved from 9.2 s/p IV infusions.  Proceed with Venofer today.  Recommend p.o. iron.  # Poorly controlled BG- 145 [Dr.Solum]-STABLE.   # Etiology-CKD-III; GFR 43 overall stable.  [Dr.Kolluru];   # DISPOSITION: #  Venofer today;  # 1 month- lab-cbc- Venofer # follow up 3 months- MD; labs- cbc/bmp; iron studies/ferritin-; possible venofer- Dr.B

## 2019-11-07 ENCOUNTER — Ambulatory Visit
Admission: RE | Admit: 2019-11-07 | Discharge: 2019-11-07 | Disposition: A | Discharge status: Home / Self Care | Payer: Medicare Other | Source: Ambulatory Visit | Attending: Nephrology | Admitting: Nephrology

## 2019-11-07 DIAGNOSIS — D649 Anemia, unspecified: Secondary | ICD-10-CM | POA: Diagnosis present

## 2019-11-07 DIAGNOSIS — E875 Hyperkalemia: Secondary | ICD-10-CM | POA: Diagnosis present

## 2019-11-07 DIAGNOSIS — E1122 Type 2 diabetes mellitus with diabetic chronic kidney disease: Secondary | ICD-10-CM | POA: Diagnosis present

## 2019-11-07 DIAGNOSIS — N1832 Chronic kidney disease, stage 3b: Secondary | ICD-10-CM | POA: Diagnosis present

## 2019-11-07 IMAGING — US US RENAL
1 series · 14 of 25 positions shown · non-contrast
Comparison: Prior CT from [DATE].

CLINICAL DATA: Initial evaluation for stage III B chronic kidney
disease.

EXAM:
RENAL / URINARY TRACT ULTRASOUND COMPLETE

[Series 1: us renal · 0.21mm/px · 14 of 48 slices shown]
[im 1/48]
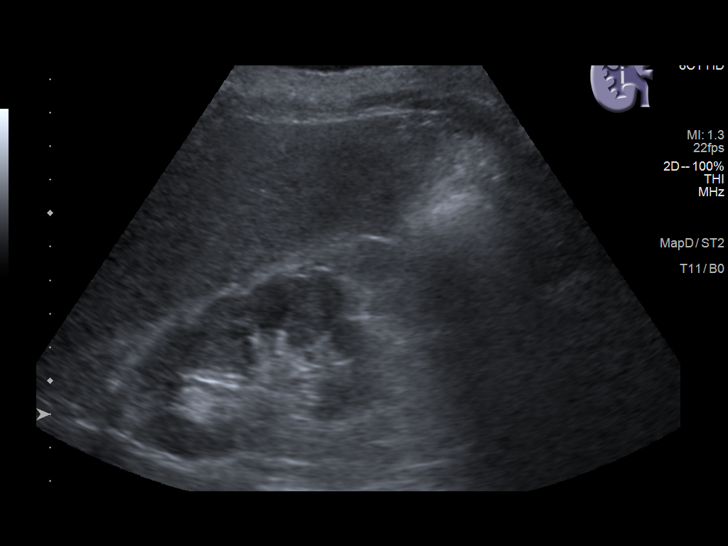
[im 4/48]
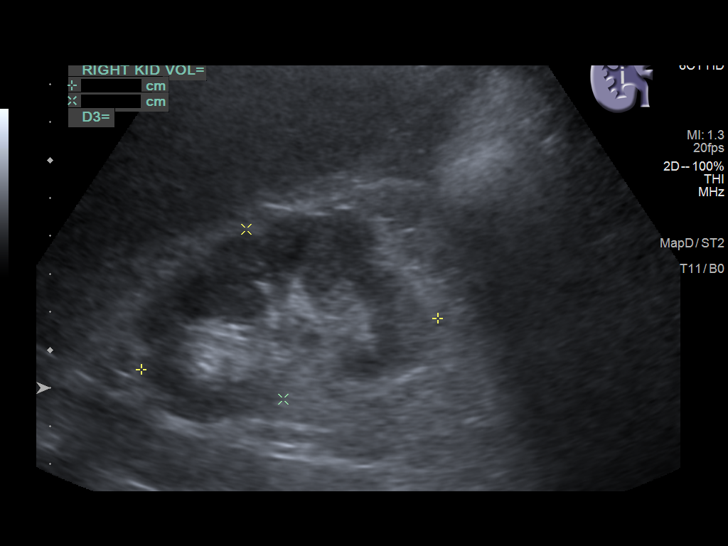
[im 8/48]
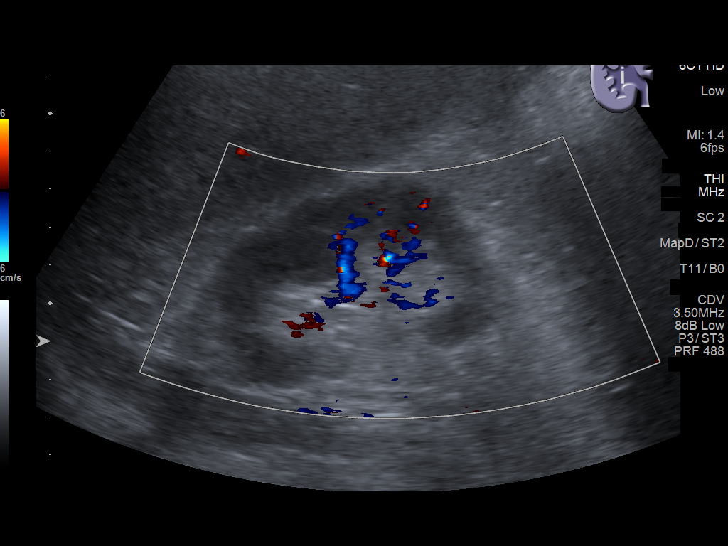
[im 12/48]
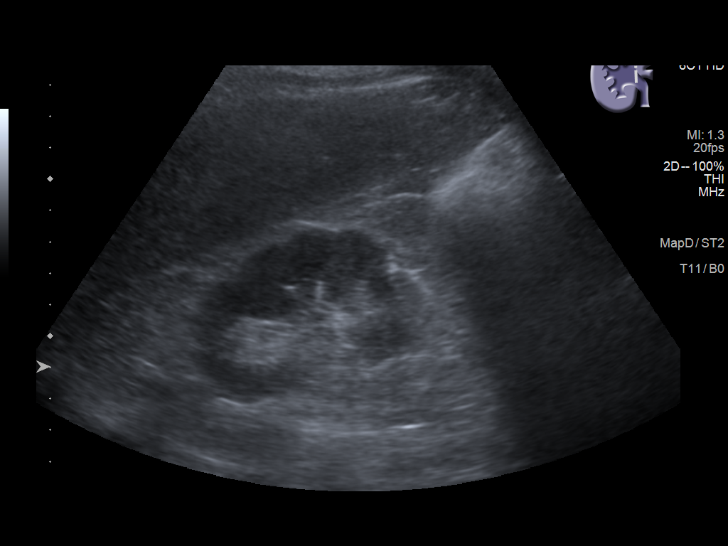
[im 16/48]
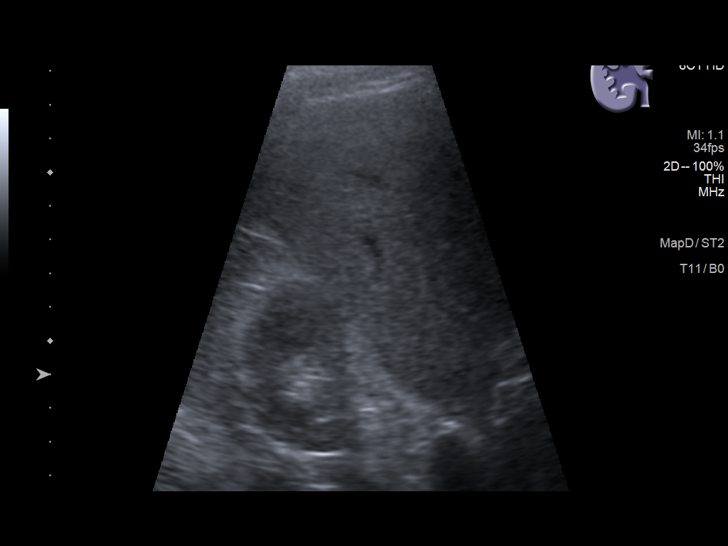
[im 18/48]
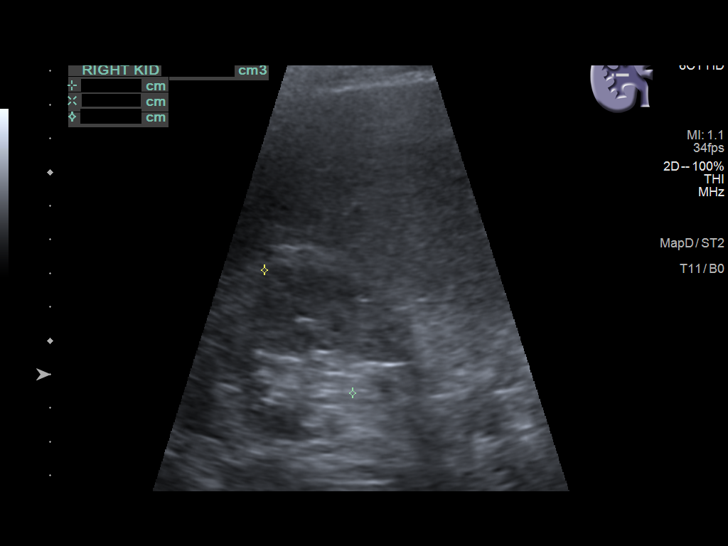
[im 22/48]
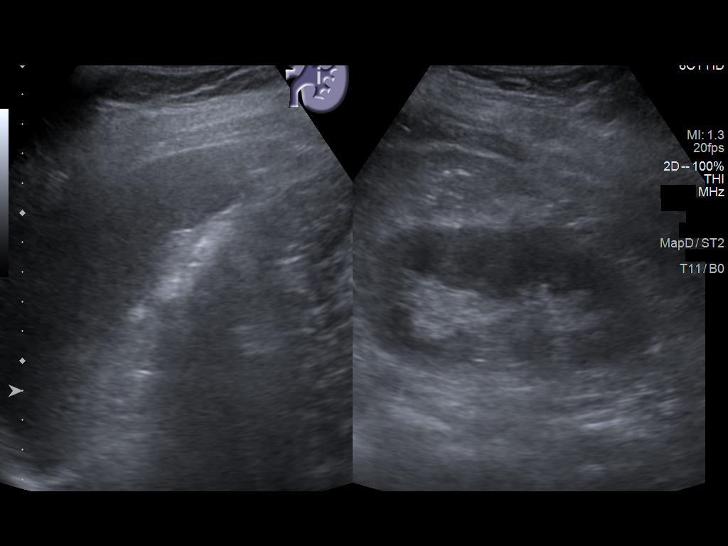
[im 26/48]
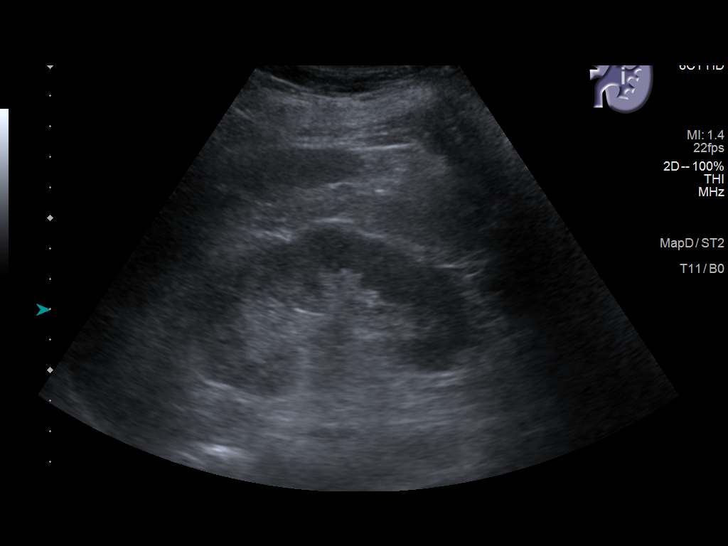
[im 30/48]
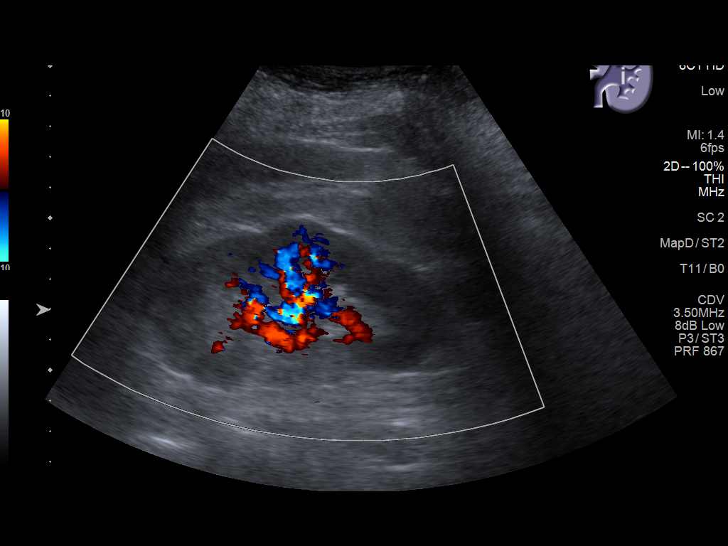
[im 32/48]
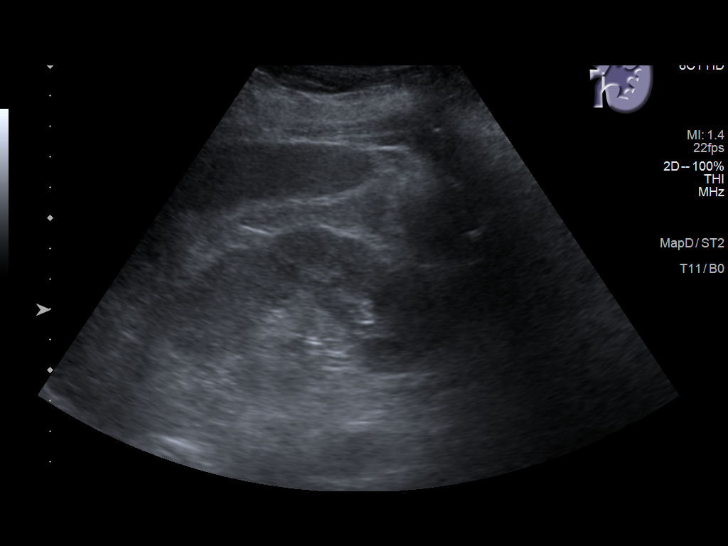
[im 36/48]
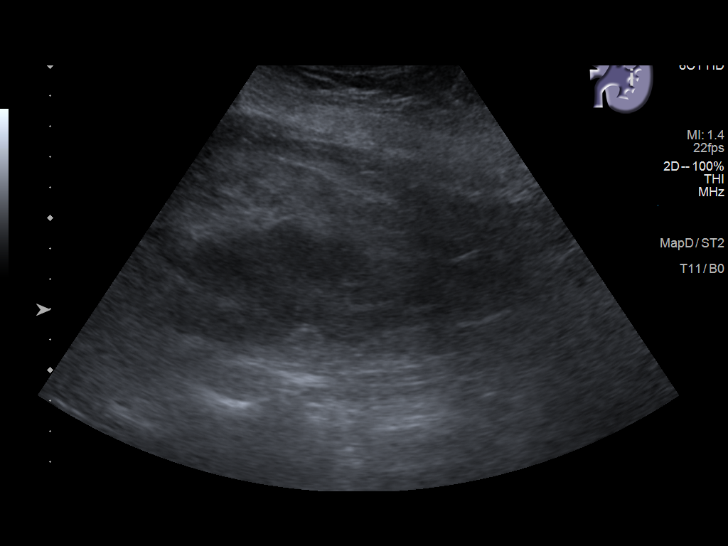
[im 40/48]
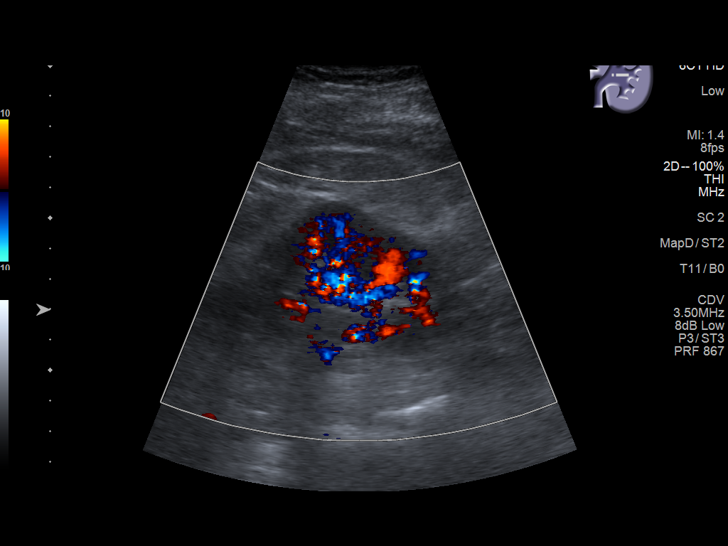
[im 44/48]
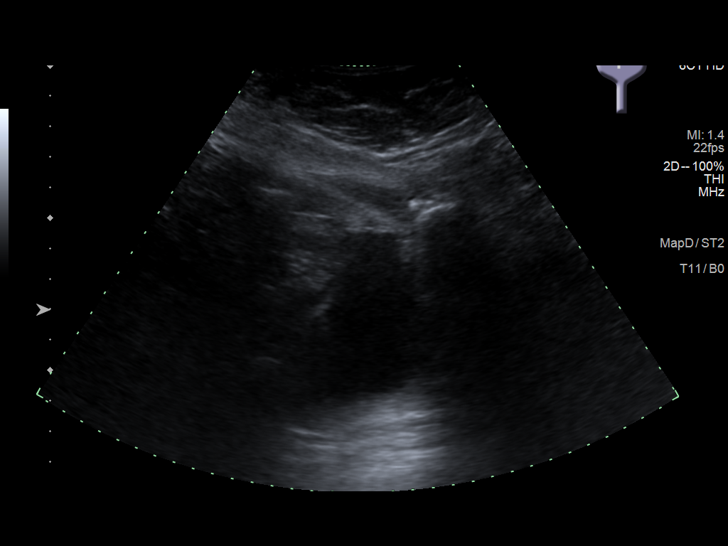
[im 48/48]
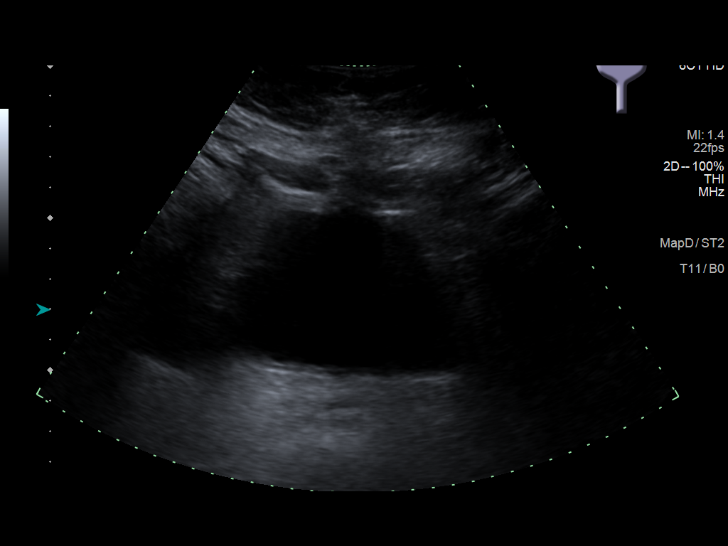

[14 of 25 positions shown; findings below may reference images not displayed]

FINDINGS: Right Kidney:

Renal measurements: 7.9 x 4.6 x 4.5 cm = volume: 85.3 mL. Renal
echogenicity within normal limits. No nephrolithiasis or
hydronephrosis. No focal renal mass.

Left Kidney:

Renal measurements: 10.0 x 5.4 x 3.9 cm = volume: 110.6 mL. Renal
echogenicity within normal limits. No nephrolithiasis or
hydronephrosis. No focal renal mass.

Bladder:

Appears normal for degree of bladder distention.

Other:

None.
IMPRESSION: Normal renal ultrasound. No hydronephrosis or other significant
finding.

## 2019-11-07 NOTE — Progress Notes (Signed)
[facility] Note  11/11/2019     CHIEF COMPLAINT Patient presents for Retina Follow Up   HISTORY OF PRESENT ILLNESS: Cheryl Leonard is a 74 y.o. female who presents to the clinic today for:   HPI    Retina Follow Up    Patient presents with  CRVO/BRVO.  In right eye.  This started months ago.  Severity is moderate.  Duration of 5 weeks.  Since onset it is stable.  I, the attending physician,  performed the HPI with the patient and updated documentation appropriately.          Comments    74 y/o female pt here for 5 wk f/u for BRVO w/mac edema OD.  No change in [place] OU.  Denies pain, FOL, floaters.  Uses Blink prn OU.  BS 150 this a.m.  A1C 6.1.       Last edited by Bernarda Caffey, MD on 11/11/2019 11:13 PM. (History)    Patient states vision is the same, her A1c was 6.1 and her BP is doing well  Referring physician: Rusty Aus, MD [place],  [place] [address]  HISTORICAL INFORMATION:   Selected notes from the MEDICAL RECORD NUMBER Referred by Dr. Marvel Plan for concern of DME OD Lab Results  Component Value Date   HGBA1C 5.9 (H) 10/29/2015       CURRENT MEDICATIONS: Current Outpatient Medications (Ophthalmic Drugs)  Medication Sig  . Aflibercept (EYLEA) 2 MG/0.05ML SOLN 2 mg by Intravitreal route every 5 (five) weeks.    Current Facility-Administered Medications (Ophthalmic Drugs)  Medication Route  . aflibercept (EYLEA) SOLN 2 mg Intravitreal  . aflibercept (EYLEA) SOLN 2 mg Intravitreal  . aflibercept (EYLEA) SOLN 2 mg Intravitreal  . aflibercept (EYLEA) SOLN 2 mg Intravitreal  . aflibercept (EYLEA) SOLN 2 mg Intravitreal   Current Outpatient Medications (Other)  Medication Sig  . acetaminophen (TYLENOL) 500 MG tablet Take 1,000 mg by mouth every 6 (six) hours as needed for moderate pain or headache.  . ALPRAZolam (XANAX) 0.25 MG tablet Take 0.25 mg by mouth daily as needed  for anxiety or sleep.   [name] amLODipine (NORVASC) 5 MG tablet Take by mouth.  . Ascorbic Acid (VITAMIN C) 1000 MG tablet Take 1,000 mg by mouth daily.  [name] aspirin EC 81 MG tablet Take 81 mg by mouth daily.   [name] buPROPion (WELLBUTRIN XL) 150 MG 24 hr tablet Take 150 mg by mouth daily.   [name] buPROPion (WELLBUTRIN XL) 300 MG 24 hr tablet Take 300 mg by mouth daily.  . calcium carbonate (TUMS EX) 750 MG chewable tablet Chew 2,250 mg by mouth daily as needed for heartburn.  . chlorhexidine (PERIDEX) 0.12 % solution Use as directed 15 mLs in the mouth or throat 2 (two) times daily.   . cholecalciferol (VITAMIN D) 1000 units tablet Take 1,000 Units by mouth 2 (two) times daily.  . clotrimazole-betamethasone (LOTRISONE) cream Apply topically.  [name] CORAL CALCIUM PO Take 1 tablet by mouth 2 (two) times daily.   [name] denosumab (PROLIA) 60 MG/ML SOLN injection Inject 60 mg into the skin every 6 (six) months.   . DENTA 5000 PLUS 1.1 % CREA dental cream Place 1 application onto teeth 2 (two) times daily. as directed  . DULoxetine (CYMBALTA) 60 MG capsule Take 1 capsule (60 mg total) by mouth 2 (two) times daily.  [name] ELIQUIS 5 MG TABS tablet TAKE 1 TABLET BY MOUTH  TWICE A DAY  . famotidine (PEPCID) 40 MG tablet Take 40 mg by mouth 2 (two) times daily.   . ferrous gluconate (FERGON) 324 MG tablet Take 324 mg by mouth daily with breakfast.  . furosemide (LASIX) 20 MG tablet Take 20 mg by mouth daily as needed for fluid or edema.   . gabapentin (NEURONTIN) 100 MG capsule Take 1 capsule by mouth daily.  . Insulin Pen Needle (B-D UF III MINI PEN NEEDLES) 31G X 5 MM MISC once daily.  [name] levothyroxine (SYNTHROID, LEVOTHROID) 100 MCG tablet Take 100 mcg by mouth daily before breakfast.   . lisinopril (PRINIVIL,ZESTRIL) 10 MG tablet Take 10 mg by mouth every morning.   . Magnesium 500 MG TABS Take 500 mg by mouth every morning.   . metoprolol succinate (TOPROL-XL) 50 MG 24 hr tablet Take by mouth.  . mirtazapine (REMERON) 15 MG  tablet Take by mouth.  . Multiple Vitamin (MULTIVITAMIN WITH MINERALS) TABS tablet Take 1 tablet by mouth daily.  . Omega-3 Fatty Acids (FISH OIL) 1000 MG CAPS Take by mouth.  . pantoprazole (PROTONIX) 40 MG tablet Take 40 mg by mouth every morning.   . Potassium (POTASSIMIN PO) Take 99 mg by mouth daily.  . rosuvastatin (CRESTOR) 20 MG tablet Take 20 mg by mouth every morning.   . solifenacin (VESICARE) 5 MG tablet Take by mouth.  . TRESIBA FLEXTOUCH 200 UNIT/ML SOPN Inject 30 Units as directed at bedtime.   [name] VASCEPA 1 g CAPS Take 1 capsule by mouth 2 (two) times daily.  . vitamin B-12 (CYANOCOBALAMIN) 1000 MCG tablet Take 1,000 mcg by mouth daily.  . vitamin E 400 UNIT capsule Take 400 Units by mouth daily.  [name] zolpidem (AMBIEN) 5 MG tablet Take 1 tablet (5 mg total) by mouth at bedtime. (Patient taking differently: Take 5 mg by mouth at bedtime as needed. )   Current Facility-Administered Medications (Other)  Medication Route  . Bevacizumab (AVASTIN) SOLN 1.25 mg Intravitreal  . Bevacizumab (AVASTIN) SOLN 1.25 mg Intravitreal  . Bevacizumab (AVASTIN) SOLN 1.25 mg Intravitreal  . Bevacizumab (AVASTIN) SOLN 1.25 mg Intravitreal  . insulin aspart (novoLOG) injection 0-15 Units Subcutaneous  . insulin aspart (novoLOG) injection 0-5 Units Subcutaneous      REVIEW OF SYSTEMS: ROS    Positive for: Genitourinary, Musculoskeletal, Endocrine, Cardiovascular, Eyes   Negative for: Constitutional, Gastrointestinal, Neurological, Skin, HENT, Respiratory, Psychiatric, Allergic/Imm, Heme/Lymph   Last edited by Matthew Folks, COA on 11/11/2019  2:41 PM. (History)       ALLERGIES Allergies  Allergen Reactions  . Ace Inhibitors Other (See Comments)    PAST MEDICAL HISTORY Past Medical History:  Diagnosis Date  . Anemia   . Anxiety   . Arthritis    Gout  . Cataracts, both eyes   . Diabetic retinopathy ([place])    NPDR OU  . Diabetic retinopathy ([place])   . GERD (gastroesophageal reflux  disease)   . Gout   . Headache    h/o migraines  . History of fracture of patella    right knee  . History of positive PPD    Patient always shows positive  . Hyperlipidemia   . Hypertension   . Hypertensive retinopathy    OU  . Hypothyroidism   . Lichen sclerosus 58/85/0277   of vulva  . Metatarsal fracture   . Neuropathy   . Peripheral vascular disease ([place])   . Polyneuropathy    numbness and tingling in feet and toes  .  Renal insufficiency    Stage 3  . Sleep apnea    does not use cpap-lost weight   . Type 2 diabetes mellitus, uncontrolled ([place])    Past Surgical History:  Procedure Laterality Date  . ABDOMINAL HYSTERECTOMY    . APPENDECTOMY    . BREAST REDUCTION SURGERY  2001  . CATARACT EXTRACTION    . CESAREAN SECTION  1976  . COLONOSCOPY  03/05/2013   Nml - due for repeat 03/06/2018  . COLONOSCOPY WITH PROPOFOL N/A 03/18/2019   Procedure: COLONOSCOPY WITH PROPOFOL;  Surgeon: Toledo, Benay Pike, MD;  Location: [facility];  Service: Gastroenterology;  Laterality: N/A;  . DIAGNOSTIC LAPAROSCOPY    . DILATION AND CURETTAGE OF UTERUS  1989  . ENDARTERECTOMY FEMORAL Bilateral 03/09/2018   Procedure: ENDARTERECTOMY FEMORAL;  Surgeon: Algernon Huxley, MD;  Location: [facility] OR[facility];  Service: Vascular;  Laterality: Bilateral;  . ENDARTERECTOMY POPLITEAL Left 03/09/2018   Procedure: ENDARTERECTOMY POPLITEAL AND SFA;  Surgeon: Algernon Huxley, MD;  Location: [facility] ORS;  Service: Vascular;  Laterality: Left;  . ESOPHAGOGASTRODUODENOSCOPY  03/05/2013  . ESOPHAGOGASTRODUODENOSCOPY (EGD) WITH PROPOFOL N/A 03/18/2019   Procedure: ESOPHAGOGASTRODUODENOSCOPY (EGD) WITH PROPOFOL;  Surgeon: Toledo, Benay Pike, MD;  Location: [facility];  Service: Gastroenterology;  Laterality: N/A;  . EYE SURGERY    . Eyelid Surgery  2012  . INTRAMEDULLARY (IM) NAIL INTERTROCHANTERIC Left 10/30/2015   Procedure: INTRAMEDULLARY (IM) NAIL INTERTROCHANTRIC ;  Surgeon: Hessie Knows, MD;  Location: [facility];  Service:  Orthopedics;  Laterality: Left;  . KYPHOPLASTY N/A 10/25/2018   Procedure: L4 KYPHOPLASTY;  Surgeon: Hessie Knows, MD;  Location: [facility];  Service: Orthopedics;  Laterality: N/A;  . LAPAROSCOPIC HYSTERECTOMY  2000   total  . LOWER EXTREMITY ANGIOGRAPHY Left 03/08/2017   Procedure: LOWER EXTREMITY ANGIOGRAPHY;  Surgeon: Algernon Huxley, MD;  Location: [place] [facility];  Service: Cardiovascular;  Laterality: Left;  . LOWER EXTREMITY ANGIOGRAPHY Left 10/30/2017   Procedure: LOWER EXTREMITY ANGIOGRAPHY;  Surgeon: Algernon Huxley, MD;  Location: [place] [facility];  Service: Cardiovascular;  Laterality: Left;  . LOWER EXTREMITY ANGIOGRAPHY Right 03/08/2018   Procedure: LOWER EXTREMITY ANGIOGRAPHY;  Surgeon: Algernon Huxley, MD;  Location: [place] [facility];  Service: Cardiovascular;  Laterality: Right;  . LOWER EXTREMITY ANGIOGRAPHY Left 10/01/2018   Procedure: LOWER EXTREMITY ANGIOGRAPHY;  Surgeon: Algernon Huxley, MD;  Location: [place] [facility];  Service: Cardiovascular;  Laterality: Left;  . LOWER EXTREMITY ANGIOGRAPHY Right 10/08/2018   Procedure: LOWER EXTREMITY ANGIOGRAPHY;  Surgeon: Algernon Huxley, MD;  Location: [place] [facility];  Service: Cardiovascular;  Laterality: Right;  . PERIPHERAL VASCULAR INTERVENTION  03/08/2018   Procedure: PERIPHERAL VASCULAR INTERVENTION;  Surgeon: Algernon Huxley, MD;  Location: [place] [facility];  Service: Cardiovascular;;  . REDUCTION MAMMAPLASTY  1997  . SACROPLASTY N/A 10/25/2018   Procedure: S1 SACROPLASTY;  Surgeon: Hessie Knows, MD;  Location: [facility];  Service: Orthopedics;  Laterality: N/A;    FAMILY HISTORY Family History  Problem Relation Age of Onset  . Coronary artery disease Father   . Heart attack Father   . Coronary artery disease Mother   . Heart attack Mother   . Ovarian cancer Sister 102       sister had hormonal therapy for IVF txs-which increased risk factor for ovarian cancer  . Breast cancer Neg Hx     SOCIAL HISTORY Social  History   Tobacco Use  . Smoking status: Former Smoker  Packs/day: 1.00    Years: 20.00    Pack years: 20.00    Types: Cigarettes    Quit date: 03/07/1996    Years since quitting: 23.6  . Smokeless tobacco: Never Used  . Tobacco comment: started smoking at age 52  Vaping Use  . Vaping Use: Never used  Substance Use Topics  . Alcohol use: No    Alcohol/week: 0.0 standard drinks  . Drug use: No         OPHTHALMIC EXAM:  Base Eye Exam    Visual Acuity (Snellen - Linear)      Right Left   Dist [place] 20/30 -2 20/20 -2   Dist ph [place] NI        Tonometry (Tonopen, 2:46 PM)      Right Left   Pressure 14 15       Pupils      Dark Light Shape React APD   Right 3 2 Round Brisk None   Left 3 2 Round Brisk None       Visual Fields (Counting fingers)      Left Right    Full Full       Extraocular Movement      Right Left    Full, Ortho Full, Ortho       Neuro/Psych    Oriented x3: Yes   Mood/Affect: Normal       Dilation    Both eyes: 1.0% Mydriacyl, 2.5% Phenylephrine @ 2:46 PM        Slit Lamp and Fundus Exam    External Exam      Right Left   External Normal Normal       Slit Lamp Exam      Right Left   Lids/Lashes dermatochalasis dermatochalasis   Conjunctiva/Sclera White and quiet White and quiet   Cornea arcus; well healed cataract wound; 2-3+ diffuse Punctate epithelial erosions, decreased TBUT, mild Anterior basement membrane dystrophy superiorly arcus; well healed cataract wound, 2-3+ diffuse Punctate epithelial erosions, irregualr epi surface, decreased TBUT   Anterior Chamber Deep and quiet Deep and quiet   Iris Round and dilated Round and dilated   Lens PCIOL; open PC PCIOL; open PC   Vitreous syneresis, Posterior vitreous detachment, vitreous condensations inferiorly syneresis, Posterior vitreous detachment       Fundus Exam      Right Left   Disc Superior hyperemia and mild edema - improved, +IRH superior disc - resolved, mild Pallor Pink  and Sharp   C/D Ratio 0.5 0.5   Macula Flat, Blunted foveal reflex, cystic changes slightly improved, +Epiretinal membrane, no heme flat; good foveal reflex, no heme or edema, small pigment clump IT to fovea   Vessels attenuated, Tortuous Vascular attenuation   Periphery attached; MAs/DBH temporal macula Attached, no heme          IMAGING AND PROCEDURES  Imaging and Procedures for 04/25/17  OCT, Retina - OU - Both Eyes       Right Eye Quality was good. Central Foveal Thickness: 348. Progression has improved. Findings include abnormal foveal contour, epiretinal membrane, no SRF, intraretinal fluid (Mild Interval improvement in cystic changes).   Left Eye Quality was good. Central Foveal Thickness: 285. Progression has been stable. Findings include normal foveal contour, no IRF, no SRF (Trace ERM).   Notes *Images captured and stored on drive  Diagnosis / Impression:  OD: BRVO -- Mild Interval improvement in cystic changes OS: NFP; no IRF/SRF--stable, trace ERM  Clinical management:  See below  Abbreviations: NFP - Normal foveal profile. CME - cystoid macular edema. PED - pigment epithelial detachment. IRF - intraretinal fluid. SRF - subretinal fluid. EZ - ellipsoid zone. ERM - epiretinal membrane. ORA - outer retinal atrophy. ORT - outer retinal tubulation. SRHM - subretinal hyper-reflective material        Intravitreal Injection, Pharmacologic Agent - OD - Right Eye       Time Out 11/11/2019. 3:32 PM. Confirmed correct patient, procedure, site, and patient consented.   Anesthesia Topical anesthesia was used. Anesthetic medications included Lidocaine 2%, Proparacaine 0.5%.   Procedure Preparation included 5% betadine to ocular surface, eyelid speculum. A (32g) needle was used.   Injection:  2 mg aflibercept Alfonse Flavors) SOLN   NDC: A3590391, Lot: [id number], Expiration date: 02/04/2020   Route: Intravitreal, Site: Right Eye, Waste: 0.05 mL  Post-op Post injection  exam found visual acuity of at least counting fingers. The patient tolerated the procedure well. There were no complications. The patient received written and verbal post procedure care education. Post injection medications were not given.                 ASSESSMENT/PLAN:    ICD-10-CM   1. Branch retinal vein occlusion of right eye with macular edema  H34.8310 Intravitreal Injection, Pharmacologic Agent - OD - Right Eye    aflibercept (EYLEA) SOLN 2 mg  2. Retinal edema  H35.81 OCT, Retina - OU - Both Eyes  3. Both eyes affected by mild nonproliferative diabetic retinopathy with macular edema, associated with type 2 diabetes mellitus ([place])  D40.8144   4. Essential hypertension  I10   5. Hypertensive retinopathy of both eyes  H35.033   6. Epiretinal membrane (ERM) of right eye  H35.371   7. Pseudophakia of both eyes  Z96.1     1,2. BRVO w/ CME OD  - by history, pt states symptoms first noticed 2 wks prior to presentation, but reports changes may have occurred prior  - initial exam with differential tortuosity of vessels (OD > OS)  - FA (02.10.20) shows mild late staining / leakage in macula, staining / leakage of disc -- improving CME  - differential includes DM2 (DME), hypertensive retinopathy, inflammatory etiology / uveitis  - S/P IVA OD #1 (02.08.19), #2 (03.11.19), #3 (04.09.19), #4 (05.20.19), #5 (02.10.20)  - gave IVA OD on 2.10.20 due to pending Eylea4U for 2020 -- resulted in increased IRF/CME  - review of OCTs show persistent IRF and cystic changes --  resistance to IVA   - June 2019 -- switched therapies: S/P IVE OD #1 (06.24.19), #2 (07.24.19), #3 (09.04.19), #4 (10.30.19),#5 (12.30.19), #6 (03.23.20), #7 (05.05.20), #8 (07.16.20), #9 (07.17.20), #10 (08.28.20), #11 (10.13.20), # 12 (11.17.20), #13 (2.8.21), #14 (03.09.21), #15 (04.13.21), #16 (05.11.21), #17 (06.17.21), #18 (07.23.21), #19 (08.30.21), #20 (10.04.21)  - OCT today shows mild interval improvement in cystic  changes OD  - BCVA 20/30 -- stable  - Eylea4U benefits investigation for 2021 completed and pt approved for IVE  - recommend IVE OD #21 today, 11.08.21  - RBA of procedure discussed, questions answered  - informed consent obtained  - see procedure note  - Eylea informed consent form obtained and scanned on 11.19.2020  - F/U December 8th  -- DFE/OCT/possible injection  3. Mild nonproliferative diabetic retinopathy, both eyes  - The incidence, risk factors for progression, natural history and treatment options for diabetic retinopathy were discussed with patient.    - The need for close monitoring of blood  glucose, blood pressure, and serum lipids, avoiding cigarette or any type of tobacco, and the need for long term follow up was also discussed with patient.  - could be contributing to CME OD  - OS with minimal diabetic retinopathy  - continue to monitor  4,5. Hypertensive retinopathy OU - stable  - as above, may have contributing to CME OD  - discussed importance of tight BP control  - monitor  6. Epiretinal membrane, right eye   - stable nasal ERM  - no indication for surgery at this time  7. Pseudophakia OU  - s/p CE/IOL OU by cataract surgeon in [facility]  - doing well  - monitor    Ophthalmic Meds Ordered this visit:  Meds ordered this encounter  Medications  . aflibercept (EYLEA) SOLN 2 mg       Return for f/u December 8th, BRVO OD, DFE, OCT.  There are no Patient Instructions on file for this visit.  This document serves as a record of services personally performed by Gardiner Sleeper, MD, PhD. It was created on their behalf by Leeann Must, [place], an ophthalmic technician. The creation of this record is the provider's dictation and/or activities during the visit.    Electronically signed by: Leeann Must, COA 11.04.2021 11:16 PM   This document serves as a record of services personally performed by Gardiner Sleeper, MD, PhD. It was created on their behalf by San Jetty.  [place], OA an ophthalmic technician. The creation of this record is the provider's dictation and/or activities during the visit.    Electronically signed by: San Jetty. Marguerita Merles 11.08.2021 11:16 PM  Gardiner Sleeper, M.D., Ph.D. Diseases & Surgery of the Retina and [place] 11/11/2019   I have reviewed the above documentation for accuracy and completeness, and I agree with the above. Gardiner Sleeper, M.D., Ph.D. 11/11/19 11:16 PM   Abbreviations: M myopia (nearsighted); A astigmatism; H hyperopia (farsighted); P presbyopia; Mrx spectacle prescription;  CTL contact lenses; OD right eye; OS left eye; OU both eyes  XT exotropia; ET esotropia; PEK punctate epithelial keratitis; PEE punctate epithelial erosions; DES dry eye syndrome; MGD meibomian gland dysfunction; ATs artificial tears; PFAT's preservative free artificial tears; [place] nuclear sclerotic cataract; PSC posterior subcapsular cataract; ERM epi-retinal membrane; PVD posterior vitreous detachment; RD retinal detachment; DM diabetes mellitus; DR diabetic retinopathy; NPDR non-proliferative diabetic retinopathy; PDR proliferative diabetic retinopathy; CSME clinically significant macular edema; DME diabetic macular edema; dbh dot blot hemorrhages; CWS cotton wool spot; POAG primary open angle glaucoma; C/D cup-to-disc ratio; HVF humphrey visual field; GVF goldmann visual field; OCT optical coherence tomography; IOP intraocular pressure; BRVO Branch retinal vein occlusion; CRVO central retinal vein occlusion; CRAO central retinal artery occlusion; BRAO branch retinal artery occlusion; RT retinal tear; SB scleral buckle; PPV pars plana vitrectomy; VH Vitreous hemorrhage; PRP panretinal laser photocoagulation; IVK intravitreal kenalog; VMT vitreomacular traction; MH Macular hole;  NVD neovascularization of the disc; NVE neovascularization elsewhere; AREDS age related eye disease study; ARMD age related macular  degeneration; POAG primary open angle glaucoma; EBMD epithelial/anterior basement membrane dystrophy; ACIOL anterior chamber intraocular lens; IOL intraocular lens; PCIOL posterior chamber intraocular lens; Phaco/IOL phacoemulsification with intraocular lens placement; [place] photorefractive keratectomy; LASIK laser assisted in situ keratomileusis; HTN hypertension; DM diabetes mellitus; COPD chronic obstructive pulmonary disease

## 2019-11-11 ENCOUNTER — Ambulatory Visit (INDEPENDENT_AMBULATORY_CARE_PROVIDER_SITE_OTHER): Payer: Medicare Other | Admitting: Ophthalmology

## 2019-11-11 ENCOUNTER — Encounter (INDEPENDENT_AMBULATORY_CARE_PROVIDER_SITE_OTHER): Payer: Self-pay | Admitting: Ophthalmology

## 2019-11-11 ENCOUNTER — Other Ambulatory Visit: Payer: Self-pay

## 2019-11-11 DIAGNOSIS — H3581 Retinal edema: Secondary | ICD-10-CM

## 2019-11-11 DIAGNOSIS — E113213 Type 2 diabetes mellitus with mild nonproliferative diabetic retinopathy with macular edema, bilateral: Secondary | ICD-10-CM | POA: Diagnosis not present

## 2019-11-11 DIAGNOSIS — H35371 Puckering of macula, right eye: Secondary | ICD-10-CM

## 2019-11-11 DIAGNOSIS — H35033 Hypertensive retinopathy, bilateral: Secondary | ICD-10-CM

## 2019-11-11 DIAGNOSIS — H34831 Tributary (branch) retinal vein occlusion, right eye, with macular edema: Secondary | ICD-10-CM

## 2019-11-11 DIAGNOSIS — Z961 Presence of intraocular lens: Secondary | ICD-10-CM

## 2019-11-11 DIAGNOSIS — I10 Essential (primary) hypertension: Secondary | ICD-10-CM

## 2019-11-11 DIAGNOSIS — H43391 Other vitreous opacities, right eye: Secondary | ICD-10-CM

## 2019-11-11 DIAGNOSIS — H4710 Unspecified papilledema: Secondary | ICD-10-CM

## 2019-11-11 MED ORDER — AFLIBERCEPT 2MG/0.05ML IZ SOLN FOR KALEIDOSCOPE
2.0000 mg | INTRAVITREAL | Status: AC | PRN
  Administered 2019-11-11: 2 mg via INTRAVITREAL

## 2019-11-23 ENCOUNTER — Other Ambulatory Visit (INDEPENDENT_AMBULATORY_CARE_PROVIDER_SITE_OTHER): Payer: Self-pay | Admitting: Vascular Surgery

## 2019-11-29 ENCOUNTER — Encounter (INDEPENDENT_AMBULATORY_CARE_PROVIDER_SITE_OTHER): Payer: Self-pay

## 2019-12-02 ENCOUNTER — Other Ambulatory Visit (INDEPENDENT_AMBULATORY_CARE_PROVIDER_SITE_OTHER): Payer: Self-pay | Admitting: Nurse Practitioner

## 2019-12-04 ENCOUNTER — Inpatient Hospital Stay: Payer: Medicare Other

## 2019-12-04 ENCOUNTER — Other Ambulatory Visit: Payer: Self-pay

## 2019-12-04 ENCOUNTER — Inpatient Hospital Stay: Payer: Medicare Other | Attending: Internal Medicine

## 2019-12-04 VITALS — BP 156/71 | HR 75 | Temp 98.7°F | Resp 20

## 2019-12-04 DIAGNOSIS — N183 Chronic kidney disease, stage 3 unspecified: Secondary | ICD-10-CM | POA: Insufficient documentation

## 2019-12-04 DIAGNOSIS — Z79899 Other long term (current) drug therapy: Secondary | ICD-10-CM | POA: Insufficient documentation

## 2019-12-04 DIAGNOSIS — D649 Anemia, unspecified: Secondary | ICD-10-CM

## 2019-12-04 DIAGNOSIS — D509 Iron deficiency anemia, unspecified: Secondary | ICD-10-CM | POA: Insufficient documentation

## 2019-12-04 LAB — CBC WITH DIFFERENTIAL/PLATELET
Abs Immature Granulocytes: 0.01 10*3/uL (ref 0.00–0.07)
Basophils Absolute: 0.1 10*3/uL (ref 0.0–0.1)
Basophils Relative: 1 %
Eosinophils Absolute: 0.2 10*3/uL (ref 0.0–0.5)
Eosinophils Relative: 3 %
HCT: 33.8 % — ABNORMAL LOW (ref 36.0–46.0)
Hemoglobin: 11.1 g/dL — ABNORMAL LOW (ref 12.0–15.0)
Immature Granulocytes: 0 %
Lymphocytes Relative: 25 %
Lymphs Abs: 1.7 10*3/uL (ref 0.7–4.0)
MCH: 28 pg (ref 26.0–34.0)
MCHC: 32.8 g/dL (ref 30.0–36.0)
MCV: 85.4 fL (ref 80.0–100.0)
Monocytes Absolute: 0.6 10*3/uL (ref 0.1–1.0)
Monocytes Relative: 8 %
Neutro Abs: 4.2 10*3/uL (ref 1.7–7.7)
Neutrophils Relative %: 63 %
Platelets: 268 10*3/uL (ref 150–400)
RBC: 3.96 MIL/uL (ref 3.87–5.11)
RDW: 15.9 % — ABNORMAL HIGH (ref 11.5–15.5)
WBC: 6.7 10*3/uL (ref 4.0–10.5)
nRBC: 0 % (ref 0.0–0.2)

## 2019-12-04 MED ORDER — SODIUM CHLORIDE 0.9 % IV SOLN
Freq: Once | INTRAVENOUS | Status: AC
  Filled 2019-12-04: qty 250

## 2019-12-04 MED ORDER — IRON SUCROSE 20 MG/ML IV SOLN
200.0000 mg | Freq: Once | INTRAVENOUS | Status: AC
  Administered 2019-12-04: 200 mg via INTRAVENOUS
  Filled 2019-12-04: qty 10

## 2019-12-04 NOTE — Progress Notes (Signed)
Venofer well tolerated. Discharged home in stable condition. 

## 2019-12-09 NOTE — Progress Notes (Shared)
[facility] - Clinic Note  12/11/2019     CHIEF COMPLAINT Patient presents for Retina Follow Up   HISTORY OF PRESENT ILLNESS: Cheryl Leonard is a 74 y.o. female who presents to the clinic today for:   HPI    Retina Follow Up    Patient presents with  CRVO/BRVO.  In right eye.  This started weeks ago.  Severity is moderate.  Duration of weeks.  Since onset it is stable.  I, the attending physician,  performed the HPI with the patient and updated documentation appropriately.          Comments    Pt states VA is the same OU.  Pt denies eye pain or discomfort and denies any new or worsening floaters or fol OU.       Last edited by [name], MD on 12/11/2019  8:46 PM. (History)    Patient states vision is stable, she states her A1c is 6.8  Referring physician: Danella Penton, MD [address] [facility] [place],  [place] [address]  HISTORICAL INFORMATION:   Selected notes from the MEDICAL RECORD NUMBER Referred by Dr. Senaida Ores for concern of DME OD Lab Results  Component Value Date   HGBA1C 5.9 (H) 10/29/2015       CURRENT MEDICATIONS: Current Outpatient Medications (Ophthalmic Drugs)  Medication Sig  . Aflibercept (EYLEA) 2 MG/0.05ML SOLN 2 mg by Intravitreal route every 5 (five) weeks.    Current Facility-Administered Medications (Ophthalmic Drugs)  Medication Route  . aflibercept (EYLEA) SOLN 2 mg Intravitreal  . aflibercept (EYLEA) SOLN 2 mg Intravitreal  . aflibercept (EYLEA) SOLN 2 mg Intravitreal  . aflibercept (EYLEA) SOLN 2 mg Intravitreal  . aflibercept (EYLEA) SOLN 2 mg Intravitreal   Current Outpatient Medications (Other)  Medication Sig  . acetaminophen (TYLENOL) 500 MG tablet Take 1,000 mg by mouth every 6 (six) hours as needed for moderate pain or headache.  . ALPRAZolam (XANAX) 0.25 MG tablet Take 0.25 mg by mouth daily as needed for anxiety or sleep.   [name] amLODipine (NORVASC) 5 MG tablet  Take by mouth.  . Ascorbic Acid (VITAMIN C) 1000 MG tablet Take 1,000 mg by mouth daily.  [name] aspirin EC 81 MG tablet Take 81 mg by mouth daily.   [name] buPROPion (WELLBUTRIN XL) 150 MG 24 hr tablet Take 150 mg by mouth daily.   [name] buPROPion (WELLBUTRIN XL) 300 MG 24 hr tablet Take 300 mg by mouth daily.  . calcium carbonate (TUMS EX) 750 MG chewable tablet Chew 2,250 mg by mouth daily as needed for heartburn.  . chlorhexidine (PERIDEX) 0.12 % solution Use as directed 15 mLs in the mouth or throat 2 (two) times daily.   . cholecalciferol (VITAMIN D) 1000 units tablet Take 1,000 Units by mouth 2 (two) times daily.  . clotrimazole-betamethasone (LOTRISONE) cream Apply topically.  [name] CORAL CALCIUM PO Take 1 tablet by mouth 2 (two) times daily.   [name] denosumab (PROLIA) 60 MG/ML SOLN injection Inject 60 mg into the skin every 6 (six) months.   . DENTA 5000 PLUS 1.1 % CREA dental cream Place 1 application onto teeth 2 (two) times daily. as directed  . DULoxetine (CYMBALTA) 60 MG capsule Take 1 capsule (60 mg total) by mouth 2 (two) times daily.  [name] ELIQUIS 5 MG TABS tablet TAKE 1 TABLET BY MOUTH TWICE A DAY  . famotidine (PEPCID) 40 MG tablet Take 40 mg by mouth 2 (two) times daily.   [name]  ferrous gluconate (FERGON) 324 MG tablet Take 324 mg by mouth daily with breakfast.  . furosemide (LASIX) 20 MG tablet Take 20 mg by mouth daily as needed for fluid or edema.   . gabapentin (NEURONTIN) 100 MG capsule Take 1 capsule by mouth daily.  . Insulin Pen Needle (B-D UF III MINI PEN NEEDLES) 31G X 5 MM MISC once daily.  [name] levothyroxine (SYNTHROID, LEVOTHROID) 100 MCG tablet Take 100 mcg by mouth daily before breakfast.   . lisinopril (PRINIVIL,ZESTRIL) 10 MG tablet Take 10 mg by mouth every morning.   . Magnesium 500 MG TABS Take 500 mg by mouth every morning.   . metoprolol succinate (TOPROL-XL) 50 MG 24 hr tablet Take by mouth.  . mirtazapine (REMERON) 15 MG tablet Take by mouth.  . Multiple Vitamin (MULTIVITAMIN WITH  MINERALS) TABS tablet Take 1 tablet by mouth daily.  . Omega-3 Fatty Acids (FISH OIL) 1000 MG CAPS Take by mouth.  . pantoprazole (PROTONIX) 40 MG tablet Take 40 mg by mouth every morning.   . Potassium (POTASSIMIN PO) Take 99 mg by mouth daily.  . rosuvastatin (CRESTOR) 20 MG tablet Take 20 mg by mouth every morning.   . solifenacin (VESICARE) 5 MG tablet Take by mouth.  . TRESIBA FLEXTOUCH 200 UNIT/ML SOPN Inject 30 Units as directed at bedtime.   [name] VASCEPA 1 g CAPS Take 1 capsule by mouth 2 (two) times daily.  . vitamin B-12 (CYANOCOBALAMIN) 1000 MCG tablet Take 1,000 mcg by mouth daily.  . vitamin E 400 UNIT capsule Take 400 Units by mouth daily.  [name] zolpidem (AMBIEN) 5 MG tablet Take 1 tablet (5 mg total) by mouth at bedtime. (Patient taking differently: Take 5 mg by mouth at bedtime as needed. )   Current Facility-Administered Medications (Other)  Medication Route  . Bevacizumab (AVASTIN) SOLN 1.25 mg Intravitreal  . Bevacizumab (AVASTIN) SOLN 1.25 mg Intravitreal  . Bevacizumab (AVASTIN) SOLN 1.25 mg Intravitreal  . Bevacizumab (AVASTIN) SOLN 1.25 mg Intravitreal  . insulin aspart (novoLOG) injection 0-15 Units Subcutaneous  . insulin aspart (novoLOG) injection 0-5 Units Subcutaneous      REVIEW OF SYSTEMS: ROS    Positive for: Genitourinary, Musculoskeletal, Endocrine, Cardiovascular, Eyes   Negative for: Constitutional, Gastrointestinal, Neurological, Skin, HENT, Respiratory, Psychiatric, Allergic/Imm, Heme/Lymph   Last edited by Corrinne Eagle on 12/11/2019  2:32 PM. (History)       ALLERGIES Allergies  Allergen Reactions  . Ace Inhibitors Other (See Comments)    PAST MEDICAL HISTORY Past Medical History:  Diagnosis Date  . Anemia   . Anxiety   . Arthritis    Gout  . Cataracts, both eyes   . Diabetic retinopathy (HCC)    NPDR OU  . Diabetic retinopathy (HCC)   . GERD (gastroesophageal reflux disease)   . Gout   . Headache    h/o migraines  . History of  fracture of patella    right knee  . History of positive PPD    Patient always shows positive  . Hyperlipidemia   . Hypertension   . Hypertensive retinopathy    OU  . Hypothyroidism   . Lichen sclerosus 12/30/2013   of vulva  . Metatarsal fracture   . Neuropathy   . Peripheral vascular disease (HCC)   . Polyneuropathy    numbness and tingling in feet and toes  . Renal insufficiency    Stage 3  . Sleep apnea    does not use cpap-lost weight   .  Type 2 diabetes mellitus, uncontrolled (HCC)    Past Surgical History:  Procedure Laterality Date  . ABDOMINAL HYSTERECTOMY    . APPENDECTOMY    . BREAST REDUCTION SURGERY  2001  . CATARACT EXTRACTION    . CESAREAN SECTION  1976  . COLONOSCOPY  03/05/2013   Nml - due for repeat 03/06/2018  . COLONOSCOPY WITH PROPOFOL N/A 03/18/2019   Procedure: COLONOSCOPY WITH PROPOFOL;  Surgeon: Toledo, [name], MD;  Location: [facility];  Service: Gastroenterology;  Laterality: N/A;  . DIAGNOSTIC LAPAROSCOPY    . DILATION AND CURETTAGE OF UTERUS  1989  . ENDARTERECTOMY FEMORAL Bilateral 03/09/2018   Procedure: ENDARTERECTOMY FEMORAL;  Surgeon: Annice Needy, MD;  Location: [facility];  Service: Vascular;  Laterality: Bilateral;  . ENDARTERECTOMY POPLITEAL Left 03/09/2018   Procedure: ENDARTERECTOMY POPLITEAL AND SFA;  Surgeon: Annice Needy, MD;  Location: [facility];  Service: Vascular;  Laterality: Left;  . ESOPHAGOGASTRODUODENOSCOPY  03/05/2013  . ESOPHAGOGASTRODUODENOSCOPY (EGD) WITH PROPOFOL N/A 03/18/2019   Procedure: ESOPHAGOGASTRODUODENOSCOPY (EGD) WITH PROPOFOL;  Surgeon: Toledo, [name], MD;  Location: [facility];  Service: Gastroenterology;  Laterality: N/A;  . EYE SURGERY    . Eyelid Surgery  2012  . INTRAMEDULLARY (IM) NAIL INTERTROCHANTERIC Left 10/30/2015   Procedure: INTRAMEDULLARY (IM) NAIL INTERTROCHANTRIC ;  Surgeon: Kennedy Bucker, MD;  Location: [facility];  Service: Orthopedics;  Laterality: Left;  . KYPHOPLASTY N/A 10/25/2018    Procedure: L4 KYPHOPLASTY;  Surgeon: Kennedy Bucker, MD;  Location: [facility];  Service: Orthopedics;  Laterality: N/A;  . LAPAROSCOPIC HYSTERECTOMY  2000   total  . LOWER EXTREMITY ANGIOGRAPHY Left 03/08/2017   Procedure: LOWER EXTREMITY ANGIOGRAPHY;  Surgeon: Annice Needy, MD;  Location: [facility];  Service: Cardiovascular;  Laterality: Left;  . LOWER EXTREMITY ANGIOGRAPHY Left 10/30/2017   Procedure: LOWER EXTREMITY ANGIOGRAPHY;  Surgeon: Annice Needy, MD;  Location: [facility];  Service: Cardiovascular;  Laterality: Left;  . LOWER EXTREMITY ANGIOGRAPHY Right 03/08/2018   Procedure: LOWER EXTREMITY ANGIOGRAPHY;  Surgeon: Annice Needy, MD;  Location: [facility];  Service: Cardiovascular;  Laterality: Right;  . LOWER EXTREMITY ANGIOGRAPHY Left 10/01/2018   Procedure: LOWER EXTREMITY ANGIOGRAPHY;  Surgeon: Annice Needy, MD;  Location: [facility];  Service: Cardiovascular;  Laterality: Left;  . LOWER EXTREMITY ANGIOGRAPHY Right 10/08/2018   Procedure: LOWER EXTREMITY ANGIOGRAPHY;  Surgeon: Annice Needy, MD;  Location: [facility];  Service: Cardiovascular;  Laterality: Right;  . PERIPHERAL VASCULAR INTERVENTION  03/08/2018   Procedure: PERIPHERAL VASCULAR INTERVENTION;  Surgeon: Annice Needy, MD;  Location: [facility];  Service: Cardiovascular;;  . REDUCTION MAMMAPLASTY  1997  . SACROPLASTY N/A 10/25/2018   Procedure: S1 SACROPLASTY;  Surgeon: Kennedy Bucker, MD;  Location: [facility];  Service: Orthopedics;  Laterality: N/A;    FAMILY HISTORY Family History  Problem Relation Age of Onset  . Coronary artery disease Father   . Heart attack Father   . Coronary artery disease Mother   . Heart attack Mother   . Ovarian cancer Sister 15       sister had hormonal therapy for IVF txs-which increased risk factor for ovarian cancer  . Breast cancer Neg Hx     SOCIAL HISTORY Social History   Tobacco Use  . Smoking status: Former Smoker     Packs/day: 1.00    Years: 20.00    Pack years: 20.00    Types: Cigarettes  Quit date: 03/07/1996    Years since quitting: 23.7  . Smokeless tobacco: Never Used  . Tobacco comment: started smoking at age 65  Vaping Use  . Vaping Use: Never used  Substance Use Topics  . Alcohol use: No    Alcohol/week: 0.0 standard drinks  . Drug use: No         OPHTHALMIC EXAM:  Base Eye Exam    Visual Acuity (Snellen - Linear)      Right Left   Dist [place] 20/40 -1 20/20 -2   Dist ph [place] 20/30 -1        Tonometry (Tonopen, 2:37 PM)      Right Left   Pressure 13 14       Pupils      Dark Light Shape React APD   Right 2 1 Round Brisk 0   Left 2 1 Round Brisk 0       Visual Fields      Left Right    Full Full       Extraocular Movement      Right Left    Full Full       Neuro/Psych    Oriented x3: Yes   Mood/Affect: Normal       Dilation    Both eyes: 1.0% Mydriacyl, 2.5% Phenylephrine @ 2:37 PM        Slit Lamp and Fundus Exam    External Exam      Right Left   External Normal Normal       Slit Lamp Exam      Right Left   Lids/Lashes dermatochalasis dermatochalasis   Conjunctiva/Sclera White and quiet White and quiet   Cornea arcus; well healed cataract wound; 2-3+ diffuse Punctate epithelial erosions, decreased TBUT, mild Anterior basement membrane dystrophy superiorly arcus; well healed cataract wound, 2-3+ diffuse Punctate epithelial erosions, irregualr epi surface, decreased TBUT   Anterior Chamber Deep and quiet Deep and quiet   Iris Round and dilated Round and dilated   Lens PCIOL; open PC PCIOL; open PC   Vitreous syneresis, Posterior vitreous detachment, vitreous condensations inferiorly syneresis, Posterior vitreous detachment       Fundus Exam      Right Left   Disc Superior hyperemia and mild edema - improved, mild Pallor Pink and Sharp   C/D Ratio 0.5 0.5   Macula Flat, Blunted foveal reflex, cystic changes slightly increased, +Epiretinal membrane,  no heme flat; good foveal reflex, no heme or edema, small pigment clump IT to fovea   Vessels attenuated, Tortuous Vascular attenuation   Periphery attached; MAs/DBH temporal macula Attached, no heme          IMAGING AND PROCEDURES  Imaging and Procedures for 04/25/17  OCT, Retina - OU - Both Eyes       Right Eye Quality was good. Central Foveal Thickness: 360. Progression has worsened. Findings include abnormal foveal contour, epiretinal membrane, no SRF, intraretinal fluid (Mild Interval increase in IRF).   Left Eye Quality was good. Central Foveal Thickness: 284. Progression has been stable. Findings include normal foveal contour, no IRF, no SRF (Trace ERM).   Notes *Images captured and stored on drive  Diagnosis / Impression:  OD: BRVO -- Mild Interval increase in IRF OS: NFP; no IRF/SRF--stable, trace ERM  Clinical management:  See below  Abbreviations: NFP - Normal foveal profile. CME - cystoid macular edema. PED - pigment epithelial detachment. IRF - intraretinal fluid. SRF - subretinal fluid. EZ - ellipsoid zone.  ERM - epiretinal membrane. ORA - outer retinal atrophy. ORT - outer retinal tubulation. SRHM - subretinal hyper-reflective material        Intravitreal Injection, Pharmacologic Agent - OD - Right Eye       Time Out 12/11/2019. 3:21 PM. Confirmed correct patient, procedure, site, and patient consented.   Anesthesia Topical anesthesia was used. Anesthetic medications included Lidocaine 2%, Proparacaine 0.5%.   Procedure Preparation included 5% betadine to ocular surface, eyelid speculum. A (32g) needle was used.   Injection:  2 mg aflibercept Gretta Cool(EYLEA) SOLN   NDC: [id number], Lot: [id number], Expiration date: 04/02/2020   Route: Intravitreal, Site: Right Eye, Waste: 0.05 mL  Post-op Post injection exam found visual acuity of at least counting fingers. The patient tolerated the procedure well. There were no complications. The patient received written  and verbal post procedure care education. Post injection medications were not given.                 ASSESSMENT/PLAN:    ICD-10-CM   1. Branch retinal vein occlusion of right eye with macular edema  H34.8310 Intravitreal Injection, Pharmacologic Agent - OD - Right Eye    aflibercept (EYLEA) SOLN 2 mg  2. Retinal edema  H35.81 OCT, Retina - OU - Both Eyes  3. 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 Essential hypertension  I10   5. Hypertensive retinopathy of both eyes  H35.033   6. Epiretinal membrane (ERM) of right eye  H35.371   7. Pseudophakia of both eyes  Z96.1     1,2. BRVO w/ CME OD  - by history, pt states symptoms first noticed 2 wks prior to presentation, but reports changes may have occurred prior  - initial exam with differential tortuosity of vessels (OD > OS)  - FA (02.10.20) shows mild late staining / leakage in macula, staining / leakage of disc -- improving CME  - differential includes DM2 (DME), hypertensive retinopathy, inflammatory etiology / uveitis  - S/P IVA OD #1 (02.08.19), #2 (03.11.19), #3 (04.09.19), #4 (05.20.19), #5 (02.10.20)  - gave IVA OD on 2.10.20 due to pending Eylea4U for 2020 -- resulted in increased IRF/CME  - review of OCTs show persistent IRF and cystic changes --  resistance to IVA   - June 2019 -- switched therapies: S/P IVE OD #1 (06.24.19), #2 (07.24.19), #3 (09.04.19), #4 (10.30.19),#5 (12.30.19), #6 (03.23.20), #7 (05.05.20), #8 (07.16.20), #9 (07.17.20), #10 (08.28.20), #11 (10.13.20), # 12 (11.17.20), #13 (2.8.21), #14 (03.09.21), #15 (04.13.21), #16 (05.11.21), #17 (06.17.21), #18 (07.23.21), #19 (08.30.21), #20 (10.04.21), #21 (11.08.21)  - OCT today shows mild interval increase in cystic changes OD  - BCVA 20/30 -- stable  - Eylea4U benefits investigation for 2021 completed and pt approved for IVE  - recommend IVE OD #22  today, 12.08.21  - RBA of procedure discussed, questions answered  - informed consent obtained  - see procedure note  - Eylea informed consent form obtained and scanned on 11.19.2020  - f/u 4-5 weeks  -- DFE/OCT/possible injection  3. Mild nonproliferative diabetic retinopathy, both eyes  - The incidence, risk factors for progression, natural history and treatment options for diabetic retinopathy were discussed with patient.    - The need for close monitoring of blood glucose, blood pressure, and serum lipids, avoiding cigarette or any type of tobacco, and the need for long term follow up was also discussed with patient.  - could  be contributing to CME OD  - OS with minimal diabetic retinopathy  - continue to monitor  4,5. Hypertensive retinopathy OU - stable  - as above, may have contributing to CME OD  - discussed importance of tight BP control  - monitor  6. Epiretinal membrane, right eye   - stable nasal ERM  - no indication for surgery at this time  7. Pseudophakia OU  - s/p CE/IOL OU by cataract surgeon in [facility]  - doing well  - monitor    Ophthalmic Meds Ordered this visit:  Meds ordered this encounter  Medications  . aflibercept (EYLEA) SOLN 2 mg       Return in about 5 weeks (around 01/15/2020) for f/u BRVO OD, DFE, OCT.  There are no Patient Instructions on file for this visit.  This document serves as a record of services personally performed by Karie Chimera, MD, PhD. It was created on their behalf by Glee Arvin. Manson Passey, OA an ophthalmic technician. The creation of this record is the provider's dictation and/or activities during the visit.    Electronically signed by: Glee Arvin. Manson Passey, [place] 12.06.2021 9:09 PM  Karie Chimera, M.D., Ph.D. Diseases & Surgery of the Retina and Vitreous [facility]  I have reviewed the above documentation for accuracy and completeness, and I agree with the above. Karie Chimera, M.D., Ph.D. 12/11/19 9:09  PM   Abbreviations: M myopia (nearsighted); A astigmatism; H hyperopia (farsighted); P presbyopia; Mrx spectacle prescription;  CTL contact lenses; OD right eye; OS left eye; OU both eyes  XT exotropia; ET esotropia; PEK punctate epithelial keratitis; PEE punctate epithelial erosions; DES dry eye syndrome; MGD meibomian gland dysfunction; ATs artificial tears; PFAT's preservative free artificial tears; NSC nuclear sclerotic cataract; PSC posterior subcapsular cataract; ERM epi-retinal membrane; PVD posterior vitreous detachment; RD retinal detachment; DM diabetes mellitus; DR diabetic retinopathy; NPDR non-proliferative diabetic retinopathy; PDR proliferative diabetic retinopathy; CSME clinically significant macular edema; DME diabetic macular edema; dbh dot blot hemorrhages; CWS cotton wool spot; POAG primary open angle glaucoma; C/D cup-to-disc ratio; HVF humphrey visual field; GVF goldmann visual field; OCT optical coherence tomography; IOP intraocular pressure; BRVO Branch retinal vein occlusion; CRVO central retinal vein occlusion; CRAO central retinal artery occlusion; BRAO branch retinal artery occlusion; RT retinal tear; SB scleral buckle; PPV pars plana vitrectomy; VH Vitreous hemorrhage; PRP panretinal laser photocoagulation; IVK intravitreal kenalog; VMT vitreomacular traction; MH Macular hole;  NVD neovascularization of the disc; NVE neovascularization elsewhere; AREDS age related eye disease study; ARMD age related macular degeneration; POAG primary open angle glaucoma; EBMD epithelial/anterior basement membrane dystrophy; ACIOL anterior chamber intraocular lens; IOL intraocular lens; PCIOL posterior chamber intraocular lens; Phaco/IOL phacoemulsification with intraocular lens placement; PRK photorefractive keratectomy; LASIK laser assisted in situ keratomileusis; HTN hypertension; DM diabetes mellitus; COPD chronic obstructive pulmonary disease

## 2019-12-11 ENCOUNTER — Other Ambulatory Visit: Payer: Self-pay

## 2019-12-11 ENCOUNTER — Encounter (INDEPENDENT_AMBULATORY_CARE_PROVIDER_SITE_OTHER): Payer: Self-pay | Admitting: Ophthalmology

## 2019-12-11 ENCOUNTER — Ambulatory Visit (INDEPENDENT_AMBULATORY_CARE_PROVIDER_SITE_OTHER): Payer: Medicare Other | Admitting: Ophthalmology

## 2019-12-11 DIAGNOSIS — H4710 Unspecified papilledema: Secondary | ICD-10-CM

## 2019-12-11 DIAGNOSIS — Z961 Presence of intraocular lens: Secondary | ICD-10-CM

## 2019-12-11 DIAGNOSIS — E113213 Type 2 diabetes mellitus with mild nonproliferative diabetic retinopathy with macular edema, bilateral: Secondary | ICD-10-CM | POA: Diagnosis not present

## 2019-12-11 DIAGNOSIS — H35371 Puckering of macula, right eye: Secondary | ICD-10-CM

## 2019-12-11 DIAGNOSIS — H34831 Tributary (branch) retinal vein occlusion, right eye, with macular edema: Secondary | ICD-10-CM | POA: Diagnosis not present

## 2019-12-11 DIAGNOSIS — I10 Essential (primary) hypertension: Secondary | ICD-10-CM | POA: Diagnosis not present

## 2019-12-11 DIAGNOSIS — H3581 Retinal edema: Secondary | ICD-10-CM

## 2019-12-11 DIAGNOSIS — H43391 Other vitreous opacities, right eye: Secondary | ICD-10-CM

## 2019-12-11 DIAGNOSIS — H35033 Hypertensive retinopathy, bilateral: Secondary | ICD-10-CM

## 2019-12-11 MED ORDER — AFLIBERCEPT 2MG/0.05ML IZ SOLN FOR KALEIDOSCOPE
2.0000 mg | INTRAVITREAL | Status: AC | PRN
  Administered 2019-12-11: 2 mg via INTRAVITREAL

## 2019-12-31 ENCOUNTER — Encounter (INDEPENDENT_AMBULATORY_CARE_PROVIDER_SITE_OTHER): Payer: Self-pay

## 2019-12-31 ENCOUNTER — Other Ambulatory Visit (INDEPENDENT_AMBULATORY_CARE_PROVIDER_SITE_OTHER): Payer: Self-pay | Admitting: Nurse Practitioner

## 2019-12-31 MED ORDER — RIVAROXABAN 20 MG PO TABS: 20.0000 mg | ORAL_TABLET | Freq: Every day | ORAL | 11 refills | Status: DC

## 2020-01-15 ENCOUNTER — Encounter (INDEPENDENT_AMBULATORY_CARE_PROVIDER_SITE_OTHER): Payer: Medicare Other | Admitting: Ophthalmology

## 2020-01-23 ENCOUNTER — Encounter (INDEPENDENT_AMBULATORY_CARE_PROVIDER_SITE_OTHER): Payer: Medicare Other | Admitting: Ophthalmology

## 2020-01-24 ENCOUNTER — Encounter (INDEPENDENT_AMBULATORY_CARE_PROVIDER_SITE_OTHER): Payer: Medicare Other | Admitting: Ophthalmology

## 2020-01-30 NOTE — Progress Notes (Signed)
[facility] - Clinic Note  02/03/2020     CHIEF COMPLAINT Patient presents for Retina Follow Up   HISTORY OF PRESENT ILLNESS: Cheryl Leonard is a 75 y.o. female who presents to the clinic today for:   HPI    Retina Follow Up    Patient presents with  CRVO/BRVO.  In right eye.  This started weeks ago.  Severity is moderate.  Duration of weeks.  Since onset it is stable.  I, the attending physician,  performed the HPI with the patient and updated documentation appropriately.          Comments    Pt states vision is the same OU.  Pt denies eye pain or discomfort.  Pt denies any new or worsening floaters or fol OU.       Last edited by [name], MD on 02/03/2020  3:54 PM. (History)    Patient was in the hospital with pneumonia for 4 days in [facility], pt states afterwards she was in her drs office and fell bc of sciatica, pt states she received an injection, which has helped a little bit, pt is having sx on her finger in February  Referring physician: Danella Penton, MD [address] [facility] [address] [facility] [place],  [place] [address]  HISTORICAL INFORMATION:   Selected notes from the MEDICAL RECORD NUMBER Referred by Dr. Senaida Ores for concern of DME OD Lab Results  Component Value Date   HGBA1C 5.9 (H) 10/29/2015       CURRENT MEDICATIONS: Current Outpatient Medications (Ophthalmic Drugs)  Medication Sig  . Aflibercept (EYLEA) 2 MG/0.05ML SOLN 2 mg by Intravitreal route every 5 (five) weeks.    Current Facility-Administered Medications (Ophthalmic Drugs)  Medication Route  . aflibercept (EYLEA) SOLN 2 mg Intravitreal  . aflibercept (EYLEA) SOLN 2 mg Intravitreal  . aflibercept (EYLEA) SOLN 2 mg Intravitreal  . aflibercept (EYLEA) SOLN 2 mg Intravitreal  . aflibercept (EYLEA) SOLN 2 mg Intravitreal   Current Outpatient Medications (Other)  Medication Sig  . acetaminophen (TYLENOL) 500 MG tablet Take 1,000 mg by mouth every 6  (six) hours as needed for moderate pain or headache.  . ALPRAZolam (XANAX) 0.25 MG tablet Take 0.25 mg by mouth daily as needed for anxiety or sleep.   [name] amLODipine (NORVASC) 5 MG tablet Take by mouth.  . Ascorbic Acid (VITAMIN C) 1000 MG tablet Take 1,000 mg by mouth daily.  [name] aspirin EC 81 MG tablet Take 81 mg by mouth daily.   [name] buPROPion (WELLBUTRIN XL) 150 MG 24 hr tablet Take 150 mg by mouth daily.   [name] buPROPion (WELLBUTRIN XL) 300 MG 24 hr tablet Take 300 mg by mouth daily.  . calcium carbonate (TUMS EX) 750 MG chewable tablet Chew 2,250 mg by mouth daily as needed for heartburn.  . chlorhexidine (PERIDEX) 0.12 % solution Use as directed 15 mLs in the mouth or throat 2 (two) times daily.   . cholecalciferol (VITAMIN D) 1000 units tablet Take 1,000 Units by mouth 2 (two) times daily.  . clotrimazole-betamethasone (LOTRISONE) cream Apply topically.  [name] CORAL CALCIUM PO Take 1 tablet by mouth 2 (two) times daily.   [name] denosumab (PROLIA) 60 MG/ML SOLN injection Inject 60 mg into the skin every 6 (six) months.   . DENTA 5000 PLUS 1.1 % CREA dental cream Place 1 application onto teeth 2 (two) times daily. as directed  . DULoxetine (CYMBALTA) 60 MG capsule Take 1 capsule (60 mg total)  by mouth 2 (two) times daily.  . famotidine (PEPCID) 40 MG tablet Take 40 mg by mouth 2 (two) times daily.   . ferrous gluconate (FERGON) 324 MG tablet Take 324 mg by mouth daily with breakfast.  . furosemide (LASIX) 20 MG tablet Take 20 mg by mouth daily as needed for fluid or edema.   . gabapentin (NEURONTIN) 100 MG capsule Take 1 capsule by mouth daily.  . Insulin Pen Needle (B-D UF III MINI PEN NEEDLES) 31G X 5 MM MISC once daily.  [name] levothyroxine (SYNTHROID, LEVOTHROID) 100 MCG tablet Take 100 mcg by mouth daily before breakfast.   . lisinopril (PRINIVIL,ZESTRIL) 10 MG tablet Take 10 mg by mouth every morning.   . Magnesium 500 MG TABS Take 500 mg by mouth every morning.   . metoprolol succinate (TOPROL-XL) 50  MG 24 hr tablet Take by mouth.  . mirtazapine (REMERON) 15 MG tablet Take by mouth.  . Multiple Vitamin (MULTIVITAMIN WITH MINERALS) TABS tablet Take 1 tablet by mouth daily.  . Omega-3 Fatty Acids (FISH OIL) 1000 MG CAPS Take by mouth.  . pantoprazole (PROTONIX) 40 MG tablet Take 40 mg by mouth every morning.   . Potassium (POTASSIMIN PO) Take 99 mg by mouth daily.  . rivaroxaban (XARELTO) 20 MG TABS tablet Take 1 tablet (20 mg total) by mouth daily with supper.  . rosuvastatin (CRESTOR) 20 MG tablet Take 20 mg by mouth every morning.   . solifenacin (VESICARE) 5 MG tablet Take by mouth.  . TRESIBA FLEXTOUCH 200 UNIT/ML SOPN Inject 30 Units as directed at bedtime.   [name] VASCEPA 1 g CAPS Take 1 capsule by mouth 2 (two) times daily.  . vitamin B-12 (CYANOCOBALAMIN) 1000 MCG tablet Take 1,000 mcg by mouth daily.  . vitamin E 400 UNIT capsule Take 400 Units by mouth daily.  [name] zolpidem (AMBIEN) 5 MG tablet Take 1 tablet (5 mg total) by mouth at bedtime. (Patient taking differently: Take 5 mg by mouth at bedtime as needed. )   Current Facility-Administered Medications (Other)  Medication Route  . Bevacizumab (AVASTIN) SOLN 1.25 mg Intravitreal  . Bevacizumab (AVASTIN) SOLN 1.25 mg Intravitreal  . Bevacizumab (AVASTIN) SOLN 1.25 mg Intravitreal  . Bevacizumab (AVASTIN) SOLN 1.25 mg Intravitreal  . insulin aspart (novoLOG) injection 0-15 Units Subcutaneous  . insulin aspart (novoLOG) injection 0-5 Units Subcutaneous      REVIEW OF SYSTEMS: ROS    Positive for: Genitourinary, Musculoskeletal, Endocrine, Cardiovascular, Eyes   Negative for: Constitutional, Gastrointestinal, Neurological, Skin, HENT, Respiratory, Psychiatric, Allergic/Imm, Heme/Lymph   Last edited by Corrinne Eagle on 02/03/2020  2:06 PM. (History)       ALLERGIES Allergies  Allergen Reactions  . Ace Inhibitors Other (See Comments)    PAST MEDICAL HISTORY Past Medical History:  Diagnosis Date  . Anemia   . Anxiety    . Arthritis    Gout  . Cataracts, both eyes   . Diabetic retinopathy (HCC)    NPDR OU  . Diabetic retinopathy (HCC)   . GERD (gastroesophageal reflux disease)   . Gout   . Headache    h/o migraines  . History of fracture of patella    right knee  . History of positive PPD    Patient always shows positive  . Hyperlipidemia   . Hypertension   . Hypertensive retinopathy    OU  . Hypothyroidism   . Lichen sclerosus 12/30/2013   of vulva  . Metatarsal fracture   . Neuropathy   .  Peripheral vascular disease (HCC)   . Polyneuropathy    numbness and tingling in feet and toes  . Renal insufficiency    Stage 3  . Sleep apnea    does not use cpap-lost weight   . Type 2 diabetes mellitus, uncontrolled (HCC)    Past Surgical History:  Procedure Laterality Date  . ABDOMINAL HYSTERECTOMY    . APPENDECTOMY    . BREAST REDUCTION SURGERY  2001  . CATARACT EXTRACTION    . CESAREAN SECTION  1976  . COLONOSCOPY  03/05/2013   Nml - due for repeat 03/06/2018  . COLONOSCOPY WITH PROPOFOL N/A 03/18/2019   Procedure: COLONOSCOPY WITH PROPOFOL;  Surgeon: Toledo, [name], MD;  Location: [facility];  Service: Gastroenterology;  Laterality: N/A;  . DIAGNOSTIC LAPAROSCOPY    . DILATION AND CURETTAGE OF UTERUS  1989  . ENDARTERECTOMY FEMORAL Bilateral 03/09/2018   Procedure: ENDARTERECTOMY FEMORAL;  Surgeon: Annice Needy, MD;  Location: [facility];  Service: Vascular;  Laterality: Bilateral;  . ENDARTERECTOMY POPLITEAL Left 03/09/2018   Procedure: ENDARTERECTOMY POPLITEAL AND SFA;  Surgeon: Annice Needy, MD;  Location: [facility];  Service: Vascular;  Laterality: Left;  . ESOPHAGOGASTRODUODENOSCOPY  03/05/2013  . ESOPHAGOGASTRODUODENOSCOPY (EGD) WITH PROPOFOL N/A 03/18/2019   Procedure: ESOPHAGOGASTRODUODENOSCOPY (EGD) WITH PROPOFOL;  Surgeon: Toledo, [name], MD;  Location: [facility];  Service: Gastroenterology;  Laterality: N/A;  . EYE SURGERY    . Eyelid Surgery  2012  . INTRAMEDULLARY (IM)  NAIL INTERTROCHANTERIC Left 10/30/2015   Procedure: INTRAMEDULLARY (IM) NAIL INTERTROCHANTRIC ;  Surgeon: Kennedy Bucker, MD;  Location: [facility];  Service: Orthopedics;  Laterality: Left;  . KYPHOPLASTY N/A 10/25/2018   Procedure: L4 KYPHOPLASTY;  Surgeon: Kennedy Bucker, MD;  Location: [facility];  Service: Orthopedics;  Laterality: N/A;  . LAPAROSCOPIC HYSTERECTOMY  2000   total  . LOWER EXTREMITY ANGIOGRAPHY Left 03/08/2017   Procedure: LOWER EXTREMITY ANGIOGRAPHY;  Surgeon: Annice Needy, MD;  Location: [facility];  Service: Cardiovascular;  Laterality: Left;  . LOWER EXTREMITY ANGIOGRAPHY Left 10/30/2017   Procedure: LOWER EXTREMITY ANGIOGRAPHY;  Surgeon: Annice Needy, MD;  Location: [facility];  Service: Cardiovascular;  Laterality: Left;  . LOWER EXTREMITY ANGIOGRAPHY Right 03/08/2018   Procedure: LOWER EXTREMITY ANGIOGRAPHY;  Surgeon: Annice Needy, MD;  Location: [facility];  Service: Cardiovascular;  Laterality: Right;  . LOWER EXTREMITY ANGIOGRAPHY Left 10/01/2018   Procedure: LOWER EXTREMITY ANGIOGRAPHY;  Surgeon: Annice Needy, MD;  Location: [facility];  Service: Cardiovascular;  Laterality: Left;  . LOWER EXTREMITY ANGIOGRAPHY Right 10/08/2018   Procedure: LOWER EXTREMITY ANGIOGRAPHY;  Surgeon: Annice Needy, MD;  Location: [facility];  Service: Cardiovascular;  Laterality: Right;  . PERIPHERAL VASCULAR INTERVENTION  03/08/2018   Procedure: PERIPHERAL VASCULAR INTERVENTION;  Surgeon: Annice Needy, MD;  Location: [facility];  Service: Cardiovascular;;  . REDUCTION MAMMAPLASTY  1997  . SACROPLASTY N/A 10/25/2018   Procedure: S1 SACROPLASTY;  Surgeon: Kennedy Bucker, MD;  Location: [facility];  Service: Orthopedics;  Laterality: N/A;    FAMILY HISTORY Family History  Problem Relation Age of Onset  . Coronary artery disease Father   . Heart attack Father   . Coronary artery disease Mother   . Heart attack Mother   . Ovarian cancer  Sister 31       sister had hormonal therapy for IVF txs-which increased risk factor for ovarian cancer  . Breast cancer Neg Hx  SOCIAL HISTORY Social History   Tobacco Use  . Smoking status: Former Smoker    Packs/day: 1.00    Years: 20.00    Pack years: 20.00    Types: Cigarettes    Quit date: 03/07/1996    Years since quitting: 23.9  . Smokeless tobacco: Never Used  . Tobacco comment: started smoking at age 52  Vaping Use  . Vaping Use: Never used  Substance Use Topics  . Alcohol use: No    Alcohol/week: 0.0 standard drinks  . Drug use: No         OPHTHALMIC EXAM:  Base Eye Exam    Visual Acuity (Snellen - Linear)      Right Left   Dist [place] 20/40 -1 20/20 -1   Dist ph [place] NI    Correction: Glasses       Tonometry (Tonopen, 2:09 PM)      Right Left   Pressure 17 15       Pupils      Dark Light Shape React APD   Right 3 2 Round Brisk 0   Left 3 2 Round Brisk 0       Visual Fields      Left Right    Full Full       Extraocular Movement      Right Left    Full Full       Neuro/Psych    Oriented x3: Yes   Mood/Affect: Normal       Dilation    Both eyes: 1.0% Mydriacyl, 2.5% Phenylephrine @ 2:09 PM        Slit Lamp and Fundus Exam    External Exam      Right Left   External Normal Normal       Slit Lamp Exam      Right Left   Lids/Lashes dermatochalasis dermatochalasis   Conjunctiva/Sclera White and quiet White and quiet   Cornea arcus; well healed cataract wound; 2-3+ diffuse Punctate epithelial erosions, decreased TBUT, mild Anterior basement membrane dystrophy superiorly arcus; well healed cataract wound, 2-3+ diffuse Punctate epithelial erosions, irregualr epi surface, decreased TBUT   Anterior Chamber Deep and quiet Deep and quiet   Iris Round and dilated Round and dilated   Lens PCIOL; open PC PCIOL; open PC   Vitreous syneresis, Posterior vitreous detachment, vitreous condensations inferiorly syneresis, Posterior vitreous  detachment       Fundus Exam      Right Left   Disc Superior hyperemia and mild edema - improved, mild Pallor Pink and Sharp   C/D Ratio 0.5 0.5   Macula Flat, Blunted foveal reflex, cystic changes slightly increased, +Epiretinal membrane, no heme flat; good foveal reflex, no heme or edema, small pigment clump IT to fovea   Vessels attenuated, Tortuous Vascular attenuation   Periphery attached; MAs/DBH temporal macula Attached, no heme          IMAGING AND PROCEDURES  Imaging and Procedures for 04/25/17  OCT, Retina - OU - Both Eyes       Right Eye Quality was good. Central Foveal Thickness: 400. Progression has worsened. Findings include abnormal foveal contour, epiretinal membrane, no SRF, intraretinal fluid (Interval increase in IRF).   Left Eye Quality was good. Central Foveal Thickness: 283. Progression has been stable. Findings include normal foveal contour, no IRF, no SRF (Trace ERM).   Notes *Images captured and stored on drive  Diagnosis / Impression:  OD: BRVO -- Interval increase in IRF OS: NFP;  no IRF/SRF--stable, trace ERM  Clinical management:  See below  Abbreviations: NFP - Normal foveal profile. CME - cystoid macular edema. PED - pigment epithelial detachment. IRF - intraretinal fluid. SRF - subretinal fluid. EZ - ellipsoid zone. ERM - epiretinal membrane. ORA - outer retinal atrophy. ORT - outer retinal tubulation. SRHM - subretinal hyper-reflective material        Intravitreal Injection, Pharmacologic Agent - OD - Right Eye       Time Out 02/03/2020. 2:46 PM. Confirmed correct patient, procedure, site, and patient consented.   Anesthesia Topical anesthesia was used. Anesthetic medications included Lidocaine 2%, Proparacaine 0.5%.   Procedure Preparation included 5% betadine to ocular surface, eyelid speculum. A (32g) needle was used.   Injection:  2 mg aflibercept Gretta Cool) SOLN   NDC: L6038910, Lot: [id number], Expiration date:  06/02/2020   Route: Intravitreal, Site: Right Eye, Waste: 0.05 mL  Post-op Post injection exam found visual acuity of at least counting fingers. The patient tolerated the procedure well. There were no complications. The patient received written and verbal post procedure care education. Post injection medications were not given.                 ASSESSMENT/PLAN:    ICD-10-CM   1. Branch retinal vein occlusion of right eye with macular edema  H34.8310 Intravitreal Injection, Pharmacologic Agent - OD - Right Eye    aflibercept (EYLEA) SOLN 2 mg  2. Retinal edema  H35.81 OCT, Retina - OU - Both Eyes  3. Both eyes affected by mild nonproliferative diabetic retinopathy with macular edema, associated with type 2 diabetes mellitus (HCC)  W29.5621   4. Essential hypertension  I10   5. Hypertensive retinopathy of both eyes  H35.033   6. Epiretinal membrane (ERM) of right eye  H35.371   7. Pseudophakia of both eyes  Z96.1     1,2. BRVO w/ CME OD  - delayed f/u from 4 wks to 7.5 wks  - by history, pt states symptoms first noticed 2 wks prior to presentation, but reports changes may have occurred prior  - initial exam with differential tortuosity of vessels (OD > OS)  - FA (02.10.20) shows mild late staining / leakage in macula, staining / leakage of disc -- improving CME  - differential includes DM2 (DME), hypertensive retinopathy, inflammatory etiology / uveitis  - S/P IVA OD #1 (02.08.19), #2 (03.11.19), #3 (04.09.19), #4 (05.20.19), #5 (02.10.20)  - gave IVA OD on 2.10.20 due to pending Eylea4U for 2020 -- resulted in increased IRF/CME  - review of OCTs show persistent IRF and cystic changes --  resistance to IVA   - June 2019 -- switched therapies: S/P IVE OD #1 (06.24.19), #2 (07.24.19), #3 (09.04.19), #4 (10.30.19),#5 (12.30.19), #6 (03.23.20), #7 (05.05.20), #8 (07.16.20), #9 (07.17.20), #10 (08.28.20), #11 (10.13.20), # 12 (11.17.20), #13 (2.8.21), #14 (03.09.21), #15 (04.13.21), #16  (05.11.21), #17 (06.17.21), #18 (07.23.21), #19 (08.30.21), #20 (10.04.21), #21 (11.08.21), #22 (12.08.21)  - OCT today shows mild interval increase in IRF OD  - BCVA 20/40 -- slightly decreased  - Eylea4U benefits investigation completed and pt approved for IVE for 2022  - recommend IVE OD #23 today, 01.31.22  - RBA of procedure discussed, questions answered  - informed consent obtained  - see procedure note  - Eylea informed consent form obtained and scanned on 11.19.2020  - f/u 4-5 weeks  -- DFE/OCT/possible injection  3. Mild nonproliferative diabetic retinopathy, both eyes  - The incidence, risk factors for progression, natural  history and treatment options for diabetic retinopathy were discussed with patient.    - The need for close monitoring of blood glucose, blood pressure, and serum lipids, avoiding cigarette or any type of tobacco, and the need for long term follow up was also discussed with patient.  - could be contributing to CME OD  - OS with minimal diabetic retinopathy  - continue to monitor  4,5. Hypertensive retinopathy OU - stable  - as above, may have contributing to CME OD  - discussed importance of tight BP control  - monitor  6. Epiretinal membrane, right eye   - stable nasal ERM  - no indication for surgery at this time  7. Pseudophakia OU  - [facility]  - doing well  - monitor    Ophthalmic Meds Ordered this visit:  Meds ordered this encounter  Medications  . aflibercept (EYLEA) SOLN 2 mg       Return in about 4 weeks (around 03/02/2020) for f/u BRVO OD, DFE, OCT.  This document serves as a record of services personally performed by Karie ChimeraBrian G. Marcy Sookdeo, MD, PhD. It was created on their behalf by Herby AbrahamAshley English, COA, an ophthalmic technician. The creation of this record is the provider's dictation and/or activities during the visit.    Electronically signed by: Herby AbrahamAshley English, COA 01.27.2022 3:56 PM  This document serves  as a record of services personally performed by Karie ChimeraBrian G. Amaad Byers, MD, PhD. It was created on their behalf by Glee ArvinAmanda J. Manson PasseyBrown, OA an ophthalmic technician. The creation of this record is the provider's dictation and/or activities during the visit.    Electronically signed by: Glee ArvinAmanda J. Manson PasseyBrown, [place] 01.31.2022 3:56 PM  Karie ChimeraBrian G. Travas Schexnayder, M.D., Ph.D. Diseases & Surgery of the Retina and Vitreous [facility] 02/03/2020   I have reviewed the above documentation for accuracy and completeness, and I agree with the above. Karie ChimeraBrian G. Tynleigh Birt, M.D., Ph.D. 02/03/20 3:56 PM   Abbreviations: M myopia (nearsighted); A astigmatism; H hyperopia (farsighted); P presbyopia; Mrx spectacle prescription;  CTL contact lenses; OD right eye; OS left eye; OU both eyes  XT exotropia; ET esotropia; PEK punctate epithelial keratitis; PEE punctate epithelial erosions; DES dry eye syndrome; MGD meibomian gland dysfunction; ATs artificial tears; PFAT's preservative free artificial tears; NSC nuclear sclerotic cataract; PSC posterior subcapsular cataract; ERM epi-retinal membrane; PVD posterior vitreous detachment; RD retinal detachment; DM diabetes mellitus; DR diabetic retinopathy; NPDR non-proliferative diabetic retinopathy; PDR proliferative diabetic retinopathy; CSME clinically significant macular edema; DME diabetic macular edema; dbh dot blot hemorrhages; CWS cotton wool spot; POAG primary open angle glaucoma; C/D cup-to-disc ratio; HVF humphrey visual field; GVF goldmann visual field; OCT optical coherence tomography; IOP intraocular pressure; BRVO Branch retinal vein occlusion; CRVO central retinal vein occlusion; CRAO central retinal artery occlusion; BRAO branch retinal artery occlusion; RT retinal tear; SB scleral buckle; PPV pars plana vitrectomy; VH Vitreous hemorrhage; PRP panretinal laser photocoagulation; IVK intravitreal kenalog; VMT vitreomacular traction; MH Macular hole;  NVD neovascularization of  the disc; NVE neovascularization elsewhere; AREDS age related eye disease study; ARMD age related macular degeneration; POAG primary open angle glaucoma; EBMD epithelial/anterior basement membrane dystrophy; ACIOL anterior chamber intraocular lens; IOL intraocular lens; PCIOL posterior chamber intraocular lens; Phaco/IOL phacoemulsification with intraocular lens placement; PRK photorefractive keratectomy; LASIK laser assisted in situ keratomileusis; HTN hypertension; DM diabetes mellitus; COPD chronic obstructive pulmonary disease

## 2020-02-03 ENCOUNTER — Ambulatory Visit (INDEPENDENT_AMBULATORY_CARE_PROVIDER_SITE_OTHER): Payer: Medicare Other | Admitting: Ophthalmology

## 2020-02-03 ENCOUNTER — Encounter (INDEPENDENT_AMBULATORY_CARE_PROVIDER_SITE_OTHER): Payer: Self-pay | Admitting: Ophthalmology

## 2020-02-03 ENCOUNTER — Other Ambulatory Visit: Payer: Self-pay

## 2020-02-03 DIAGNOSIS — H3581 Retinal edema: Secondary | ICD-10-CM

## 2020-02-03 DIAGNOSIS — I10 Essential (primary) hypertension: Secondary | ICD-10-CM

## 2020-02-03 DIAGNOSIS — H34831 Tributary (branch) retinal vein occlusion, right eye, with macular edema: Secondary | ICD-10-CM | POA: Diagnosis not present

## 2020-02-03 DIAGNOSIS — H43391 Other vitreous opacities, right eye: Secondary | ICD-10-CM

## 2020-02-03 DIAGNOSIS — Z961 Presence of intraocular lens: Secondary | ICD-10-CM

## 2020-02-03 DIAGNOSIS — H35033 Hypertensive retinopathy, bilateral: Secondary | ICD-10-CM

## 2020-02-03 DIAGNOSIS — H4710 Unspecified papilledema: Secondary | ICD-10-CM

## 2020-02-03 DIAGNOSIS — E113213 Type 2 diabetes mellitus with mild nonproliferative diabetic retinopathy with macular edema, bilateral: Secondary | ICD-10-CM

## 2020-02-03 DIAGNOSIS — H35371 Puckering of macula, right eye: Secondary | ICD-10-CM

## 2020-02-03 MED ORDER — AFLIBERCEPT 2MG/0.05ML IZ SOLN FOR KALEIDOSCOPE
2.0000 mg | INTRAVITREAL | Status: AC | PRN
  Administered 2020-02-03: 2 mg via INTRAVITREAL

## 2020-02-05 ENCOUNTER — Inpatient Hospital Stay (HOSPITAL_BASED_OUTPATIENT_CLINIC_OR_DEPARTMENT_OTHER): Payer: Medicare Other | Admitting: Internal Medicine

## 2020-02-05 ENCOUNTER — Inpatient Hospital Stay: Payer: Medicare Other

## 2020-02-05 ENCOUNTER — Encounter: Payer: Self-pay | Admitting: Internal Medicine

## 2020-02-05 ENCOUNTER — Inpatient Hospital Stay: Payer: Medicare Other | Attending: Internal Medicine

## 2020-02-05 DIAGNOSIS — D631 Anemia in chronic kidney disease: Secondary | ICD-10-CM | POA: Diagnosis present

## 2020-02-05 DIAGNOSIS — D649 Anemia, unspecified: Secondary | ICD-10-CM | POA: Diagnosis not present

## 2020-02-05 DIAGNOSIS — D509 Iron deficiency anemia, unspecified: Secondary | ICD-10-CM | POA: Diagnosis present

## 2020-02-05 DIAGNOSIS — N183 Chronic kidney disease, stage 3 unspecified: Secondary | ICD-10-CM | POA: Insufficient documentation

## 2020-02-05 LAB — CBC WITH DIFFERENTIAL/PLATELET
Abs Immature Granulocytes: 0.02 10*3/uL (ref 0.00–0.07)
Basophils Absolute: 0 10*3/uL (ref 0.0–0.1)
Basophils Relative: 0 %
Eosinophils Absolute: 0.1 10*3/uL (ref 0.0–0.5)
Eosinophils Relative: 2 %
HCT: 25.2 % — ABNORMAL LOW (ref 36.0–46.0)
Hemoglobin: 8.1 g/dL — ABNORMAL LOW (ref 12.0–15.0)
Immature Granulocytes: 0 %
Lymphocytes Relative: 18 %
Lymphs Abs: 1.2 10*3/uL (ref 0.7–4.0)
MCH: 29.3 pg (ref 26.0–34.0)
MCHC: 32.1 g/dL (ref 30.0–36.0)
MCV: 91.3 fL (ref 80.0–100.0)
Monocytes Absolute: 0.6 10*3/uL (ref 0.1–1.0)
Monocytes Relative: 9 %
Neutro Abs: 4.6 10*3/uL (ref 1.7–7.7)
Neutrophils Relative %: 71 %
Platelets: 235 10*3/uL (ref 150–400)
RBC: 2.76 MIL/uL — ABNORMAL LOW (ref 3.87–5.11)
RDW: 17.2 % — ABNORMAL HIGH (ref 11.5–15.5)
WBC: 6.5 10*3/uL (ref 4.0–10.5)
nRBC: 0 % (ref 0.0–0.2)

## 2020-02-05 LAB — BASIC METABOLIC PANEL
Anion gap: 11 (ref 5–15)
BUN: 31 mg/dL — ABNORMAL HIGH (ref 8–23)
CO2: 23 mmol/L (ref 22–32)
Calcium: 9.3 mg/dL (ref 8.9–10.3)
Chloride: 106 mmol/L (ref 98–111)
Creatinine, Ser: 1.5 mg/dL — ABNORMAL HIGH (ref 0.44–1.00)
GFR, Estimated: 36 mL/min — ABNORMAL LOW (ref >60)
Glucose, Bld: 246 mg/dL — ABNORMAL HIGH (ref 70–99)
Potassium: 4.5 mmol/L (ref 3.5–5.1)
Sodium: 140 mmol/L (ref 135–145)

## 2020-02-05 LAB — IRON AND TIBC
Iron: 46 ug/dL (ref 28–170)
Saturation Ratios: 12 % (ref 10.4–31.8)
TIBC: 385 ug/dL (ref 250–450)
UIBC: 339 ug/dL

## 2020-02-05 LAB — FERRITIN: Ferritin: 14 ng/mL (ref 11–307)

## 2020-02-05 MED ORDER — SODIUM CHLORIDE 0.9 % IV SOLN
Freq: Once | INTRAVENOUS | Status: AC
  Filled 2020-02-05: qty 250

## 2020-02-05 MED ORDER — IRON SUCROSE 20 MG/ML IV SOLN
200.0000 mg | Freq: Once | INTRAVENOUS | Status: AC
Start: 2020-02-05 — End: 2020-02-05
  Administered 2020-02-05: 200 mg via INTRAVENOUS
  Filled 2020-02-05: qty 10

## 2020-02-05 NOTE — H&P (View-Only) (Signed)
Cheryl Leonard NOTE  Patient Care Team: Rusty Aus, MD as PCP - General (Internal Medicine) Josefine Class, MD as Referring Physician (Gastroenterology) Cammie Sickle, MD as Consulting Physician (Hematology and Oncology)  CHIEF COMPLAINTS/PURPOSE OF CONSULTATION: Anemia  HEMATOLOGY HISTORY  # ANEMIA- Jan 2021- 8.8/ferritin 11 [PCP]; N-WBC/platelets? IDA vs other- EGD-2015/colonoscopy-? 2015; 2020- [Dr.Skulskie] ; capsule-2016- ? 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  female anemia iron deficiency-question CKD-III s here for follow-up.   December 2021 patient was hospitalized in [place] for pneumonia [non-Covid related]; and UTI.  Unfortunately she was unable to go for her cruise trip.  Complains of mild to moderate fatigue.  Denies any blood in stools or black or stools.  Review of Systems  Constitutional: Positive for malaise/fatigue. Negative for chills, diaphoresis and fever.  HENT: Negative for nosebleeds and sore throat.   Eyes: Negative for double vision.  Respiratory: Positive for shortness of breath. Negative for cough, hemoptysis, sputum production and wheezing.   Cardiovascular: Negative for chest pain, palpitations, orthopnea and leg swelling.  Gastrointestinal: Negative for abdominal pain, blood in stool, constipation, diarrhea, heartburn, melena, nausea and vomiting.  Genitourinary: Negative for dysuria, frequency and urgency.  Musculoskeletal: Positive for joint pain.  Skin: Negative.  Negative for itching and rash.  Neurological: Negative for dizziness, tingling, focal weakness, weakness and headaches.  Endo/Heme/Allergies: Does not bruise/bleed easily.  Psychiatric/Behavioral: Negative for depression. The patient is not nervous/anxious and does not have  insomnia.     MEDICAL HISTORY:  Past Medical History:  Diagnosis Date  . Anemia   . Anxiety   . Arthritis    Gout  . Cataracts, both eyes   . Diabetic retinopathy ([place])    NPDR OU  . Diabetic retinopathy ([place])   . GERD (gastroesophageal reflux disease)   . Gout   . Headache    h/o migraines  . History of fracture of patella    right knee  . History of positive PPD    Patient always shows positive  . Hyperlipidemia   . Hypertension   . Hypertensive retinopathy    OU  . Hypothyroidism   . Lichen sclerosus 89/16/9450   of vulva  . Metatarsal fracture   . Neuropathy   . Peripheral vascular disease ([place])   . Polyneuropathy    numbness and tingling in feet and toes  . Renal insufficiency    Stage 3  . Sleep apnea    does not use cpap-lost weight   . Type 2 diabetes mellitus, uncontrolled ([place])     SURGICAL HISTORY: Past Surgical History:  Procedure Laterality Date  . ABDOMINAL HYSTERECTOMY    . APPENDECTOMY    . BREAST REDUCTION SURGERY  2001  . CATARACT EXTRACTION    . CESAREAN SECTION  1976  . COLONOSCOPY  03/05/2013   Nml - due for repeat 03/06/2018  . COLONOSCOPY WITH PROPOFOL N/A 03/18/2019   Procedure: COLONOSCOPY WITH PROPOFOL;  Surgeon: Toledo, Benay Pike, MD;  Location: [facility];  Service: Gastroenterology;  Laterality: N/A;  . DIAGNOSTIC LAPAROSCOPY    . DILATION AND CURETTAGE OF UTERUS  1989  . ENDARTERECTOMY FEMORAL Bilateral 03/09/2018   Procedure: ENDARTERECTOMY FEMORAL;  Surgeon: Algernon Huxley, MD;  Location: [facility] ORS;  Service: Vascular;  Laterality: Bilateral;  . ENDARTERECTOMY POPLITEAL Left 03/09/2018  Procedure: ENDARTERECTOMY POPLITEAL AND SFA;  Surgeon: Algernon Huxley, MD;  Location: [facility] ORS;  Service: Vascular;  Laterality: Left;  . ESOPHAGOGASTRODUODENOSCOPY  03/05/2013  . ESOPHAGOGASTRODUODENOSCOPY (EGD) WITH PROPOFOL N/A 03/18/2019   Procedure: ESOPHAGOGASTRODUODENOSCOPY (EGD) WITH PROPOFOL;  Surgeon: Toledo, Benay Pike, MD;  Location: [facility]  ENDOSCOPY;  Service: Gastroenterology;  Laterality: N/A;  . EYE SURGERY    . Eyelid Surgery  2012  . INTRAMEDULLARY (IM) NAIL INTERTROCHANTERIC Left 10/30/2015   Procedure: INTRAMEDULLARY (IM) NAIL INTERTROCHANTRIC ;  Surgeon: Hessie Knows, MD;  Location: [facility] OR[facility];  Service: Orthopedics;  Laterality: Left;  . KYPHOPLASTY N/A 10/25/2018   Procedure: L4 KYPHOPLASTY;  Surgeon: Hessie Knows, MD;  Location: [facility] OR[facility];  Service: Orthopedics;  Laterality: N/A;  . LAPAROSCOPIC HYSTERECTOMY  2000   total  . LOWER EXTREMITY ANGIOGRAPHY Left 03/08/2017   Procedure: LOWER EXTREMITY ANGIOGRAPHY;  Surgeon: Algernon Huxley, MD;  Location: [place] [facility];  Service: Cardiovascular;  Laterality: Left;  . LOWER EXTREMITY ANGIOGRAPHY Left 10/30/2017   Procedure: LOWER EXTREMITY ANGIOGRAPHY;  Surgeon: Algernon Huxley, MD;  Location: [place] [facility];  Service: Cardiovascular;  Laterality: Left;  . LOWER EXTREMITY ANGIOGRAPHY Right 03/08/2018   Procedure: LOWER EXTREMITY ANGIOGRAPHY;  Surgeon: Algernon Huxley, MD;  Location: [place] [facility];  Service: Cardiovascular;  Laterality: Right;  . LOWER EXTREMITY ANGIOGRAPHY Left 10/01/2018   Procedure: LOWER EXTREMITY ANGIOGRAPHY;  Surgeon: Algernon Huxley, MD;  Location: [place] [facility];  Service: Cardiovascular;  Laterality: Left;  . LOWER EXTREMITY ANGIOGRAPHY Right 10/08/2018   Procedure: LOWER EXTREMITY ANGIOGRAPHY;  Surgeon: Algernon Huxley, MD;  Location: [place] [facility];  Service: Cardiovascular;  Laterality: Right;  . PERIPHERAL VASCULAR INTERVENTION  03/08/2018   Procedure: PERIPHERAL VASCULAR INTERVENTION;  Surgeon: Algernon Huxley, MD;  Location: [place] [facility];  Service: Cardiovascular;;  . REDUCTION MAMMAPLASTY  1997  . SACROPLASTY N/A 10/25/2018   Procedure: S1 SACROPLASTY;  Surgeon: Hessie Knows, MD;  Location: [facility];  Service: Orthopedics;  Laterality: N/A;    SOCIAL HISTORY: Social History   Socioeconomic History  . Marital status:  Married    Spouse name: Jenny Reichmann  . Number of children: 3  . Years of education: Not on file  . Highest education level: Not on file  Occupational History  . Occupation: Retail banker  Tobacco Use  . Smoking status: Former Smoker    Packs/day: 1.00    Years: 20.00    Pack years: 20.00    Types: Cigarettes    Quit date: 03/07/1996    Years since quitting: 23.9  . Smokeless tobacco: Never Used  . Tobacco comment: started smoking at age 89  Vaping Use  . Vaping Use: Never used  Substance and Sexual Activity  . Alcohol use: No    Alcohol/week: 0.0 standard drinks  . Drug use: No  . Sexual activity: Yes    Partners: Male    Birth control/protection: Surgical  Other Topics Concern  . Not on file  Social History Narrative   Lives in [place]; with husband; quit > 20 years; no alcohol; used to work at [employer] at [employer].    Social Determinants of Health   Financial Resource Strain: Not on file  Food Insecurity: Not on file  Transportation Needs: Not on file  Physical Activity: Not on file  Stress: Not on file  Social Connections: Not on file  Intimate Partner Violence: Not on file    FAMILY HISTORY: Family History  Problem  Relation Age of Onset  . Coronary artery disease Father   . Heart attack Father   . Coronary artery disease Mother   . Heart attack Mother   . Ovarian cancer Sister 43       sister had hormonal therapy for IVF txs-which increased risk factor for ovarian cancer  . Breast cancer Neg Hx     ALLERGIES:  is allergic to ace inhibitors.  MEDICATIONS:  Current Outpatient Medications  Medication Sig Dispense Refill  . acetaminophen (TYLENOL) 500 MG tablet Take 1,000 mg by mouth every 6 (six) hours as needed for moderate pain or headache.    . Aflibercept (EYLEA) 2 MG/0.05ML SOLN 2 mg by Intravitreal route every 5 (five) weeks.     . ALPRAZolam (XANAX) 0.25 MG tablet Take 0.25 mg by mouth daily as needed for anxiety or sleep.     . amLODipine (NORVASC)  5 MG tablet Take by mouth.    . Ascorbic Acid (VITAMIN C) 1000 MG tablet Take 1,000 mg by mouth daily.    . aspirin EC 81 MG tablet Take 81 mg by mouth daily.     . cholecalciferol (VITAMIN D) 1000 units tablet Take 1,000 Units by mouth 2 (two) times daily.    . CORAL CALCIUM PO Take 1 tablet by mouth 2 (two) times daily.     . denosumab (PROLIA) 60 MG/ML SOLN injection Inject 60 mg into the skin every 6 (six) months.     . DULoxetine (CYMBALTA) 30 MG capsule Take 30 mg by mouth daily.    . famotidine (PEPCID) 40 MG tablet Take 40 mg by mouth 2 (two) times daily.     . furosemide (LASIX) 20 MG tablet Take 20 mg by mouth daily as needed for fluid or edema.     . gabapentin (NEURONTIN) 100 MG capsule Take 1 capsule by mouth daily.    . Insulin Pen Needle 31G X 5 MM MISC once daily.    . levothyroxine (SYNTHROID, LEVOTHROID) 100 MCG tablet Take 100 mcg by mouth daily before breakfast.   3  . Magnesium 500 MG TABS Take 500 mg by mouth every morning.     . metFORMIN (GLUCOPHAGE) 1000 MG tablet Take 1 tablet by mouth 2 (two) times daily with a meal.    . metoprolol succinate (TOPROL-XL) 50 MG 24 hr tablet Take by mouth.    . mirtazapine (REMERON) 15 MG tablet Take by mouth.    . Multiple Vitamin (MULTIVITAMIN WITH MINERALS) TABS tablet Take 1 tablet by mouth daily.    . olmesartan (BENICAR) 20 MG tablet Take 20 mg by mouth daily.    . pantoprazole (PROTONIX) 40 MG tablet Take 40 mg by mouth every morning.     . rivaroxaban (XARELTO) 20 MG TABS tablet Take 1 tablet (20 mg total) by mouth daily with supper. 30 tablet 11  . rosuvastatin (CRESTOR) 20 MG tablet Take 20 mg by mouth every morning.     . TRESIBA FLEXTOUCH 200 UNIT/ML SOPN Inject 30 Units as directed at bedtime.   5  . VASCEPA 1 g CAPS Take 1 capsule by mouth 2 (two) times daily.    . vitamin B-12 (CYANOCOBALAMIN) 1000 MCG tablet Take 1,000 mcg by mouth daily.    . vitamin E 400 UNIT capsule Take 400 Units by mouth daily.    . zolpidem  (AMBIEN) 5 MG tablet Take 1 tablet (5 mg total) by mouth at bedtime. (Patient taking differently: Take 5 mg by mouth at   bedtime as needed.) 30 tablet 0   No current facility-administered medications for this visit.      PHYSICAL EXAMINATION:   Vitals:   02/05/20 1304  BP: (!) 119/55  Pulse: 79  Resp: 16  Temp: (!) 97.5 F (36.4 C)  SpO2: 100%   Filed Weights   02/05/20 1304  Weight: 166 lb 12.8 oz (75.7 kg)    Physical Exam Constitutional:      Comments: Alone.  Ambulating independently.  HENT:     Head: Normocephalic and atraumatic.     Mouth/Throat:     Pharynx: No oropharyngeal exudate.  Eyes:     Pupils: Pupils are equal, round, and reactive to light.  Cardiovascular:     Rate and Rhythm: Normal rate and regular rhythm.  Pulmonary:     Effort: Pulmonary effort is normal. No respiratory distress.     Breath sounds: Normal breath sounds. No wheezing.  Abdominal:     General: Bowel sounds are normal. There is no distension.     Palpations: Abdomen is soft. There is no mass.     Tenderness: There is no abdominal tenderness. There is no guarding or rebound.  Musculoskeletal:        General: No tenderness. Normal range of motion.     Cervical back: Normal range of motion and neck supple.  Skin:    General: Skin is warm.  Neurological:     Mental Status: She is alert and oriented to person, place, and time.  Psychiatric:        Mood and Affect: Affect normal.     LABORATORY DATA:  I have reviewed the data as listed Lab Results  Component Value Date   WBC 6.5 02/05/2020   HGB 8.1 (L) 02/05/2020   HCT 25.2 (L) 02/05/2020   MCV 91.3 02/05/2020   PLT 235 02/05/2020   Recent Labs    06/05/19 1304 09/04/19 1249 11/06/19 1235 02/05/20 1251  NA 141 135 138 140  K 4.4 4.3 4.6 4.5  CL 104 103 103 106  CO2 26 21* 26 23  GLUCOSE 202* 450* 154* 246*  BUN 26* 25* 32* 31*  CREATININE 1.13* 1.34* 1.14* 1.50*  CALCIUM 9.8 8.3* 9.9 9.3  GFRNONAA 48* 39* 51*  36*  GFRAA 55* 45*  --   --      Intravitreal Injection, Pharmacologic Agent - OD - Right Eye  Result Date: 02/03/2020 Time Out 02/03/2020. 2:46 PM. Confirmed correct patient, procedure, site, and patient consented. Anesthesia Topical anesthesia was used. Anesthetic medications included Lidocaine 2%, Proparacaine 0.5%. Procedure Preparation included 5% betadine to ocular surface, eyelid speculum. A (32g) needle was used. Injection: 2 mg aflibercept (EYLEA) SOLN   NDC: 61755-005-01, Lot: [id number], Expiration date: 06/02/2020   Route: Intravitreal, Site: Right Eye, Waste: 0.05 mL Post-op Post injection exam found visual acuity of at least counting fingers. The patient tolerated the procedure well. There were no complications. The patient received written and verbal post procedure care education. Post injection medications were not given.   OCT, Retina - OU - Both Eyes  Result Date: 02/03/2020 Right Eye Quality was good. Central Foveal Thickness: 400. Progression has worsened. Findings include abnormal foveal contour, epiretinal membrane, no SRF, intraretinal fluid (Interval increase in IRF). Left Eye Quality was good. Central Foveal Thickness: 283. Progression has been stable. Findings include normal foveal contour, no IRF, no SRF (Trace ERM). Notes *Images captured and stored on drive Diagnosis / Impression: OD: BRVO -- Interval increase in IRF OS:   NFP; no IRF/SRF--stable, trace ERM Clinical management: See below Abbreviations: NFP - Normal foveal profile. CME - cystoid macular edema. PED - pigment epithelial detachment. IRF - intraretinal fluid. SRF - subretinal fluid. EZ - ellipsoid zone. ERM - epiretinal membrane. ORA - outer retinal atrophy. ORT - outer retinal tubulation. SRHM - subretinal hyper-reflective material    Normocytic anemia # Anemia/likely secondary to CKD-III/iron deficiency.  Improved s/p IV iron infusions however today- Hb 8.1 . Proceed with Venofer today.  Recommend p.o. iron.   Discussed with patient that if her hemoglobin does not improve post IV iron infusion-a bone marrow biopsy will reasonable for further evaluation.  #Diabetes -BG-246 [Dr.Solum]-overall stable.  # Etiology-CKD-III; GFR 36 overall stable.  [Dr.Kolluru]; recommend continued hydration.  # DISPOSITION: #  Venofer today;  # venofer weekly x-2- start next week. # follow up 2 months- MD; labs- cbc/bmp;  possible venofer- Dr.B  Addendum: Patient needed multiple sticks for IV access for IV iron infusion.  Patient with history of Mediport.  Given patient's clinical situation/the fact that patient will need IV iron infusions periodically in the future-I think it is reasonable to recommend a Mediport placement.  Discussed the pros and cons.  We will make a referral to vascular.  Patient in agreement.  #Iron saturation 12%; ferritin 14-anemia secondary to CKD.  Proceed with iron infusions as planned.  All questions were answered. The patient knows to call the clinic with any problems, questions or concerns.    Cammie Sickle, MD 02/07/2020 8:04 AM

## 2020-02-05 NOTE — Progress Notes (Signed)
Stable at discharge 

## 2020-02-05 NOTE — Progress Notes (Signed)
[facility] NOTE  Patient Care Team: Rusty Aus, MD as PCP - General (Internal Medicine) Josefine Class, MD as Referring Physician (Gastroenterology) Cammie Sickle, MD as Consulting Physician (Hematology and Oncology)  CHIEF COMPLAINTS/PURPOSE OF CONSULTATION: Anemia  HEMATOLOGY HISTORY  # ANEMIA- Jan 2021- 8.8/ferritin 11 [PCP]; N-WBC/platelets? IDA vs other- EGD-2015/colonoscopy-? 2015; 2020- [Dr.Skulskie] ; capsule-2016- ? 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  female anemia iron deficiency-question CKD-III s here for follow-up.   December 2021 patient was hospitalized in [place] for pneumonia [non-Covid related]; and UTI.  Unfortunately she was unable to go for her cruise trip.  Complains of mild to moderate fatigue.  Denies any blood in stools or black or stools.  Review of Systems  Constitutional: Positive for malaise/fatigue. Negative for chills, diaphoresis and fever.  HENT: Negative for nosebleeds and sore throat.   Eyes: Negative for double vision.  Respiratory: Positive for shortness of breath. Negative for cough, hemoptysis, sputum production and wheezing.   Cardiovascular: Negative for chest pain, palpitations, orthopnea and leg swelling.  Gastrointestinal: Negative for abdominal pain, blood in stool, constipation, diarrhea, heartburn, melena, nausea and vomiting.  Genitourinary: Negative for dysuria, frequency and urgency.  Musculoskeletal: Positive for joint pain.  Skin: Negative.  Negative for itching and rash.  Neurological: Negative for dizziness, tingling, focal weakness, weakness and headaches.  Endo/Heme/Allergies: Does not bruise/bleed easily.  Psychiatric/Behavioral: Negative for depression. The patient is not nervous/anxious and does not have  insomnia.     MEDICAL HISTORY:  Past Medical History:  Diagnosis Date   Anemia    Anxiety    Arthritis    Gout   Cataracts, both eyes    Diabetic retinopathy ([place])    NPDR OU   Diabetic retinopathy ([place])    GERD (gastroesophageal reflux disease)    Gout    Headache    h/o migraines   History of fracture of patella    right knee   History of positive PPD    Patient always shows positive   Hyperlipidemia    Hypertension    Hypertensive retinopathy    OU   Hypothyroidism    Lichen sclerosus 52/84/1324   of vulva   Metatarsal fracture    Neuropathy    Peripheral vascular disease (H[facility])    Polyneuropathy    numbness and tingling in feet and toes   Renal insufficiency    Stage 3   Sleep apnea    does not use cpap-lost weight    Type 2 diabetes mellitus, uncontrolled ([place])     SURGICAL HISTORY: Past Surgical History:  Procedure Laterality Date   ABDOMINAL HYSTERECTOMY     APPENDECTOMY     BREAST REDUCTION SURGERY  2001   CATARACT EXTRACTION     CESAREAN SECTION  1976   COLONOSCOPY  03/05/2013   Nml - due for repeat 03/06/2018   COLONOSCOPY WITH PROPOFOL N/A 03/18/2019   Procedure: COLONOSCOPY WITH PROPOFOL;  Surgeon: Toledo, Benay Pike, MD;  Location: [facility];  Service: Gastroenterology;  Laterality: N/A;   DIAGNOSTIC LAPAROSCOPY     DILATION AND CURETTAGE OF UTERUS  1989   ENDARTERECTOMY FEMORAL Bilateral 03/09/2018   Procedure: ENDARTERECTOMY FEMORAL;  Surgeon: Algernon Huxley, MD;  Location: [facility];  Service: Vascular;  Laterality: Bilateral;   ENDARTERECTOMY POPLITEAL Left 03/09/2018  Procedure: ENDARTERECTOMY POPLITEAL AND SFA;  Surgeon: Algernon Huxley, MD;  Location: [facility];  Service: Vascular;  Laterality: Left;   ESOPHAGOGASTRODUODENOSCOPY  03/05/2013   ESOPHAGOGASTRODUODENOSCOPY (EGD) WITH PROPOFOL N/A 03/18/2019   Procedure: ESOPHAGOGASTRODUODENOSCOPY (EGD) WITH PROPOFOL;  Surgeon: Toledo, Benay Pike, MD;  Location: [facility]  ENDOSCOPY;  Service: Gastroenterology;  Laterality: N/A;   EYE SURGERY     Eyelid Surgery  2012   INTRAMEDULLARY (IM) NAIL INTERTROCHANTERIC Left 10/30/2015   Procedure: INTRAMEDULLARY (IM) NAIL INTERTROCHANTRIC ;  Surgeon: Hessie Knows, MD;  Location: [facility];  Service: Orthopedics;  Laterality: Left;   KYPHOPLASTY N/A 10/25/2018   Procedure: L4 KYPHOPLASTY;  Surgeon: Hessie Knows, MD;  Location: [facility];  Service: Orthopedics;  Laterality: N/A;   LAPAROSCOPIC HYSTERECTOMY  2000   total   LOWER EXTREMITY ANGIOGRAPHY Left 03/08/2017   Procedure: LOWER EXTREMITY ANGIOGRAPHY;  Surgeon: Algernon Huxley, MD;  Location: [facility];  Service: Cardiovascular;  Laterality: Left;   LOWER EXTREMITY ANGIOGRAPHY Left 10/30/2017   Procedure: LOWER EXTREMITY ANGIOGRAPHY;  Surgeon: Algernon Huxley, MD;  Location: [facility] [facility];  Service: Cardiovascular;  Laterality: Left;   LOWER EXTREMITY ANGIOGRAPHY Right 03/08/2018   Procedure: LOWER EXTREMITY ANGIOGRAPHY;  Surgeon: Algernon Huxley, MD;  Location: [place] [facility];  Service: Cardiovascular;  Laterality: Right;   LOWER EXTREMITY ANGIOGRAPHY Left 10/01/2018   Procedure: LOWER EXTREMITY ANGIOGRAPHY;  Surgeon: Algernon Huxley, MD;  Location: [place] [facility];  Service: Cardiovascular;  Laterality: Left;   LOWER EXTREMITY ANGIOGRAPHY Right 10/08/2018   Procedure: LOWER EXTREMITY ANGIOGRAPHY;  Surgeon: Algernon Huxley, MD;  Location: [place] [facility];  Service: Cardiovascular;  Laterality: Right;   PERIPHERAL VASCULAR INTERVENTION  03/08/2018   Procedure: PERIPHERAL VASCULAR INTERVENTION;  Surgeon: Algernon Huxley, MD;  Location: [place] [facility];  Service: Cardiovascular;;   REDUCTION MAMMAPLASTY  1997   SACROPLASTY N/A 10/25/2018   Procedure: S1 SACROPLASTY;  Surgeon: Hessie Knows, MD;  Location: [facility];  Service: Orthopedics;  Laterality: N/A;    SOCIAL HISTORY: Social History   Socioeconomic History   Marital status:  Married    Spouse name: [name]   Number of children: 3   Years of education: Not on file   Highest education level: Not on file  Occupational History   Occupation: Retail banker  Tobacco Use   Smoking status: Former Smoker    Packs/day: 1.00    Years: 20.00    Pack years: 20.00    Types: Cigarettes    Quit date: 03/07/1996    Years since quitting: 23.9   Smokeless tobacco: Never Used   Tobacco comment: started smoking at age 32  Vaping Use   Vaping Use: Never used  Substance and Sexual Activity   Alcohol use: No    Alcohol/week: 0.0 standard drinks   Drug use: No   Sexual activity: Yes    Partners: Male    Birth control/protection: Surgical  Other Topics Concern   Not on file  Social History Narrative   Lives in [place]; with husband; quit > 20 years; no alcohol; used to work at [employer] at [employer].    Social Determinants of Health   Financial Resource Strain: Not on file  Food Insecurity: Not on file  Transportation Needs: Not on file  Physical Activity: Not on file  Stress: Not on file  Social Connections: Not on file  Intimate Partner Violence: Not on file    FAMILY HISTORY: Family History  Problem  Relation Age of Onset   Coronary artery disease Father    Heart attack Father    Coronary artery disease Mother    Heart attack Mother    Ovarian cancer Sister 7       sister had hormonal therapy for IVF txs-which increased risk factor for ovarian cancer   Breast cancer Neg Hx     ALLERGIES:  is allergic to ace inhibitors.  MEDICATIONS:  Current Outpatient Medications  Medication Sig Dispense Refill   acetaminophen (TYLENOL) 500 MG tablet Take 1,000 mg by mouth every 6 (six) hours as needed for moderate pain or headache.     Aflibercept (EYLEA) 2 MG/0.05ML SOLN 2 mg by Intravitreal route every 5 (five) weeks.      ALPRAZolam (XANAX) 0.25 MG tablet Take 0.25 mg by mouth daily as needed for anxiety or sleep.      amLODipine (NORVASC)  5 MG tablet Take by mouth.     Ascorbic Acid (VITAMIN C) 1000 MG tablet Take 1,000 mg by mouth daily.     aspirin EC 81 MG tablet Take 81 mg by mouth daily.      cholecalciferol (VITAMIN D) 1000 units tablet Take 1,000 Units by mouth 2 (two) times daily.     CORAL CALCIUM PO Take 1 tablet by mouth 2 (two) times daily.      denosumab (PROLIA) 60 MG/ML SOLN injection Inject 60 mg into the skin every 6 (six) months.      DULoxetine (CYMBALTA) 30 MG capsule Take 30 mg by mouth daily.     famotidine (PEPCID) 40 MG tablet Take 40 mg by mouth 2 (two) times daily.      furosemide (LASIX) 20 MG tablet Take 20 mg by mouth daily as needed for fluid or edema.      gabapentin (NEURONTIN) 100 MG capsule Take 1 capsule by mouth daily.     Insulin Pen Needle 31G X 5 MM MISC once daily.     levothyroxine (SYNTHROID, LEVOTHROID) 100 MCG tablet Take 100 mcg by mouth daily before breakfast.   3   Magnesium 500 MG TABS Take 500 mg by mouth every morning.      metFORMIN (GLUCOPHAGE) 1000 MG tablet Take 1 tablet by mouth 2 (two) times daily with a meal.     metoprolol succinate (TOPROL-XL) 50 MG 24 hr tablet Take by mouth.     mirtazapine (REMERON) 15 MG tablet Take by mouth.     Multiple Vitamin (MULTIVITAMIN WITH MINERALS) TABS tablet Take 1 tablet by mouth daily.     olmesartan (BENICAR) 20 MG tablet Take 20 mg by mouth daily.     pantoprazole (PROTONIX) 40 MG tablet Take 40 mg by mouth every morning.      rivaroxaban (XARELTO) 20 MG TABS tablet Take 1 tablet (20 mg total) by mouth daily with supper. 30 tablet 11   rosuvastatin (CRESTOR) 20 MG tablet Take 20 mg by mouth every morning.      TRESIBA FLEXTOUCH 200 UNIT/ML SOPN Inject 30 Units as directed at bedtime.   5   VASCEPA 1 g CAPS Take 1 capsule by mouth 2 (two) times daily.     vitamin B-12 (CYANOCOBALAMIN) 1000 MCG tablet Take 1,000 mcg by mouth daily.     vitamin E 400 UNIT capsule Take 400 Units by mouth daily.     zolpidem  (AMBIEN) 5 MG tablet Take 1 tablet (5 mg total) by mouth at bedtime. (Patient taking differently: Take 5 mg by mouth at  bedtime as needed.) 30 tablet 0   No current facility-administered medications for this visit.      PHYSICAL EXAMINATION:   Vitals:   02/05/20 1304  BP: (!) 119/55  Pulse: 79  Resp: 16  Temp: (!) 97.5 F (36.4 C)  SpO2: 100%   Filed Weights   02/05/20 1304  Weight: 166 lb 12.8 oz (75.7 kg)    Physical Exam Constitutional:      Comments: Alone.  Ambulating independently.  HENT:     Head: Normocephalic and atraumatic.     Mouth/Throat:     Pharynx: No oropharyngeal exudate.  Eyes:     Pupils: Pupils are equal, round, and reactive to light.  Cardiovascular:     Rate and Rhythm: Normal rate and regular rhythm.  Pulmonary:     Effort: Pulmonary effort is normal. No respiratory distress.     Breath sounds: Normal breath sounds. No wheezing.  Abdominal:     General: Bowel sounds are normal. There is no distension.     Palpations: Abdomen is soft. There is no mass.     Tenderness: There is no abdominal tenderness. There is no guarding or rebound.  Musculoskeletal:        General: No tenderness. Normal range of motion.     Cervical back: Normal range of motion and neck supple.  Skin:    General: Skin is warm.  Neurological:     Mental Status: She is alert and oriented to person, place, and time.  Psychiatric:        Mood and Affect: Affect normal.     LABORATORY DATA:  I have reviewed the data as listed Lab Results  Component Value Date   WBC 6.5 02/05/2020   HGB 8.1 (L) 02/05/2020   HCT 25.2 (L) 02/05/2020   MCV 91.3 02/05/2020   PLT 235 02/05/2020   Recent Labs    06/05/19 1304 09/04/19 1249 11/06/19 1235 02/05/20 1251  NA 141 135 138 140  K 4.4 4.3 4.6 4.5  CL 104 103 103 106  CO2 26 21* 26 23  GLUCOSE 202* 450* 154* 246*  BUN 26* 25* 32* 31*  CREATININE 1.13* 1.34* 1.14* 1.50*  CALCIUM 9.8 8.3* 9.9 9.3  GFRNONAA 48* 39* 51*  36*  GFRAA 55* 45*  --   --      Intravitreal Injection, Pharmacologic Agent - OD - Right Eye  Result Date: 02/03/2020 Time Out 02/03/2020. 2:46 PM. Confirmed correct patient, procedure, site, and patient consented. Anesthesia Topical anesthesia was used. Anesthetic medications included Lidocaine 2%, Proparacaine 0.5%. Procedure Preparation included 5% betadine to ocular surface, eyelid speculum. A (32g) needle was used. Injection: 2 mg aflibercept Alfonse Flavors) SOLN   NDC: A3590391, Lot: [id number], Expiration date: 06/02/2020   Route: Intravitreal, Site: Right Eye, Waste: 0.05 mL Post-op Post injection exam found visual acuity of at least counting fingers. The patient tolerated the procedure well. There were no complications. The patient received written and verbal post procedure care education. Post injection medications were not given.   OCT, Retina - OU - Both Eyes  Result Date: 02/03/2020 Right Eye Quality was good. Central Foveal Thickness: 400. Progression has worsened. Findings include abnormal foveal contour, epiretinal membrane, no SRF, intraretinal fluid (Interval increase in IRF). Left Eye Quality was good. Central Foveal Thickness: 283. Progression has been stable. Findings include normal foveal contour, no IRF, no SRF (Trace ERM). Notes *Images captured and stored on drive Diagnosis / Impression: OD: BRVO -- Interval increase in IRF OS:  NFP; no IRF/SRF--stable, trace ERM Clinical management: See below Abbreviations: NFP - Normal foveal profile. CME - cystoid macular edema. PED - pigment epithelial detachment. IRF - intraretinal fluid. SRF - subretinal fluid. EZ - ellipsoid zone. ERM - epiretinal membrane. ORA - outer retinal atrophy. ORT - outer retinal tubulation. SRHM - subretinal hyper-reflective material    Normocytic anemia # Anemia/likely secondary to CKD-III/iron deficiency.  Improved s/p IV iron infusions however today- Hb 8.1 . Proceed with Venofer today.  Recommend p.o. iron.   Discussed with patient that if her hemoglobin does not improve post IV iron infusion-a bone marrow biopsy will reasonable for further evaluation.  #Diabetes -BG-246 [Dr.Solum]-overall stable.  # Etiology-CKD-III; GFR 36 overall stable.  [Dr.Kolluru]; recommend continued hydration.  # DISPOSITION: #  Venofer today;  # venofer weekly x-2- start next week. # follow up 2 months- MD; labs- cbc/bmp;  possible venofer- Dr.B  Addendum: Patient needed multiple sticks for IV access for IV iron infusion.  Patient with history of Mediport.  Given patient's clinical situation/the fact that patient will need IV iron infusions periodically in the future-I think it is reasonable to recommend a Mediport placement.  Discussed the pros and cons.  We will make a referral to vascular.  Patient in agreement.  #Iron saturation 12%; ferritin 14-anemia secondary to CKD.  Proceed with iron infusions as planned.  All questions were answered. The patient knows to call the clinic with any problems, questions or concerns.    Cammie Sickle, MD 02/07/2020 8:04 AM

## 2020-02-05 NOTE — Assessment & Plan Note (Addendum)
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  Improved s/p IV iron infusions however today- Hb 8.1 . Proceed with Venofer today.  Recommend p.o. iron.  Discussed with patient that if her hemoglobin does not improve post IV iron infusion-a bone marrow biopsy will reasonable for further evaluation.  #Diabetes -BG-246 [Dr.Solum]-overall stable.  # Etiology-CKD-III; GFR 36 overall stable.  [Dr.Kolluru]; recommend continued hydration.  # DISPOSITION: #  Venofer today;  # venofer weekly x-2- start next week. # follow up 2 months- MD; labs- cbc/bmp;  possible venofer- Dr.B  Addendum: Patient needed multiple sticks for IV access for IV iron infusion.  Patient with history of Mediport.  Given patient's clinical situation/the fact that patient will need IV iron infusions periodically in the future-I think it is reasonable to recommend a Mediport placement.  Discussed the pros and cons.  We will make a referral to vascular.  Patient in agreement.  #Iron saturation 12%; ferritin 14-anemia secondary to CKD.  Proceed with iron infusions as planned.

## 2020-02-06 ENCOUNTER — Other Ambulatory Visit: Payer: Self-pay

## 2020-02-06 ENCOUNTER — Telehealth: Payer: Self-pay | Admitting: *Deleted

## 2020-02-06 DIAGNOSIS — N183 Chronic kidney disease, stage 3 unspecified: Secondary | ICD-10-CM

## 2020-02-06 DIAGNOSIS — D649 Anemia, unspecified: Secondary | ICD-10-CM

## 2020-02-06 DIAGNOSIS — D631 Anemia in chronic kidney disease: Secondary | ICD-10-CM

## 2020-02-06 NOTE — Telephone Encounter (Signed)
Returned patient's phone call. She previously requested an apt with Dr. Donneta Romberg to discuss port placement. Per Dr. B he spoke to the patient yesterday, while patient was in infusion. Patient agreeable for port placement. Referral placed with Dr. Wyn Quaker. Patient is already established with Dr. Wyn Quaker. Vascular also manages patient's anticoagulation.  Patient aware that referral was placed with Dr. Wyn Quaker. We can cnl her apts with Dr. Leonard Schwartz on 2/14.

## 2020-02-12 ENCOUNTER — Ambulatory Visit: Payer: Medicare Other

## 2020-02-12 ENCOUNTER — Telehealth (INDEPENDENT_AMBULATORY_CARE_PROVIDER_SITE_OTHER): Payer: Self-pay

## 2020-02-12 NOTE — Telephone Encounter (Signed)
I attempted to contact the patient to schedule the port placement an a message was left for a return call. I also called the cell number which is disconnected at this time.

## 2020-02-12 NOTE — Telephone Encounter (Signed)
Patient called back and is now scheduled with Dr. Wyn Quaker on 02/17/20 with a  10:15 am arrival to the [facility]. Covid testing on 02/13/20 between 8-1 pm at the [facility]. Pre-procedure instructions were discussed and will be mailed.

## 2020-02-13 ENCOUNTER — Other Ambulatory Visit: Payer: Self-pay

## 2020-02-13 ENCOUNTER — Other Ambulatory Visit
Admission: RE | Admit: 2020-02-13 | Discharge: 2020-02-13 | Disposition: A | Discharge status: Home / Self Care | Payer: Medicare Other | Source: Ambulatory Visit | Attending: Vascular Surgery | Admitting: Vascular Surgery

## 2020-02-13 ENCOUNTER — Inpatient Hospital Stay: Payer: Medicare Other

## 2020-02-13 DIAGNOSIS — Z01812 Encounter for preprocedural laboratory examination: Secondary | ICD-10-CM | POA: Diagnosis present

## 2020-02-13 DIAGNOSIS — Z20822 Contact with and (suspected) exposure to covid-19: Secondary | ICD-10-CM | POA: Diagnosis not present

## 2020-02-13 LAB — SARS CORONAVIRUS 2 (TAT 6-24 HRS): SARS Coronavirus 2: NEGATIVE

## 2020-02-17 ENCOUNTER — Encounter
Admission: RE | Disposition: A | Discharge status: Home / Self Care | Payer: Self-pay | Source: Home / Self Care | Attending: Vascular Surgery

## 2020-02-17 ENCOUNTER — Other Ambulatory Visit: Payer: Self-pay

## 2020-02-17 ENCOUNTER — Ambulatory Visit: Payer: Medicare Other | Admitting: Internal Medicine

## 2020-02-17 ENCOUNTER — Ambulatory Visit
Admission: RE | Admit: 2020-02-17 | Discharge: 2020-02-17 | Disposition: A | Discharge status: Home / Self Care | Payer: Medicare Other | Attending: Vascular Surgery | Admitting: Vascular Surgery

## 2020-02-17 ENCOUNTER — Other Ambulatory Visit (INDEPENDENT_AMBULATORY_CARE_PROVIDER_SITE_OTHER): Payer: Self-pay | Admitting: Nurse Practitioner

## 2020-02-17 ENCOUNTER — Telehealth (INDEPENDENT_AMBULATORY_CARE_PROVIDER_SITE_OTHER): Payer: Self-pay

## 2020-02-17 DIAGNOSIS — E1122 Type 2 diabetes mellitus with diabetic chronic kidney disease: Secondary | ICD-10-CM | POA: Insufficient documentation

## 2020-02-17 DIAGNOSIS — Z7982 Long term (current) use of aspirin: Secondary | ICD-10-CM | POA: Insufficient documentation

## 2020-02-17 DIAGNOSIS — Z7901 Long term (current) use of anticoagulants: Secondary | ICD-10-CM | POA: Diagnosis not present

## 2020-02-17 DIAGNOSIS — Z794 Long term (current) use of insulin: Secondary | ICD-10-CM | POA: Insufficient documentation

## 2020-02-17 DIAGNOSIS — Z79890 Hormone replacement therapy (postmenopausal): Secondary | ICD-10-CM | POA: Insufficient documentation

## 2020-02-17 DIAGNOSIS — I129 Hypertensive chronic kidney disease with stage 1 through stage 4 chronic kidney disease, or unspecified chronic kidney disease: Secondary | ICD-10-CM | POA: Diagnosis present

## 2020-02-17 DIAGNOSIS — Z79899 Other long term (current) drug therapy: Secondary | ICD-10-CM | POA: Insufficient documentation

## 2020-02-17 DIAGNOSIS — D500 Iron deficiency anemia secondary to blood loss (chronic): Secondary | ICD-10-CM | POA: Diagnosis not present

## 2020-02-17 DIAGNOSIS — N183 Chronic kidney disease, stage 3 unspecified: Secondary | ICD-10-CM | POA: Insufficient documentation

## 2020-02-17 DIAGNOSIS — Z87891 Personal history of nicotine dependence: Secondary | ICD-10-CM | POA: Insufficient documentation

## 2020-02-17 DIAGNOSIS — D631 Anemia in chronic kidney disease: Secondary | ICD-10-CM | POA: Insufficient documentation

## 2020-02-17 DIAGNOSIS — D649 Anemia, unspecified: Secondary | ICD-10-CM

## 2020-02-17 DIAGNOSIS — Z7984 Long term (current) use of oral hypoglycemic drugs: Secondary | ICD-10-CM | POA: Insufficient documentation

## 2020-02-17 HISTORY — PX: PORTA CATH INSERTION: CATH118285

## 2020-02-17 LAB — GLUCOSE, CAPILLARY: Glucose-Capillary: 201 mg/dL — ABNORMAL HIGH (ref 70–99)

## 2020-02-17 SURGERY — PORTA CATH INSERTION
Anesthesia: Moderate Sedation

## 2020-02-17 MED ORDER — MIDAZOLAM HCL 2 MG/ML PO SYRP: 8.0000 mg | ORAL_SOLUTION | Freq: Once | ORAL | Status: DC | PRN

## 2020-02-17 MED ORDER — CEFAZOLIN SODIUM-DEXTROSE 2-4 GM/100ML-% IV SOLN
2.0000 g | Freq: Once | INTRAVENOUS | Status: AC
  Administered 2020-02-17: 2 g via INTRAVENOUS

## 2020-02-17 MED ORDER — ONDANSETRON HCL 4 MG/2ML IJ SOLN: 4.0000 mg | Freq: Four times a day (QID) | INTRAMUSCULAR | Status: DC | PRN

## 2020-02-17 MED ORDER — FENTANYL CITRATE (PF) 100 MCG/2ML IJ SOLN
INTRAMUSCULAR | Status: AC
  Filled 2020-02-17: qty 2

## 2020-02-17 MED ORDER — METHYLPREDNISOLONE SODIUM SUCC 125 MG IJ SOLR: 125.0000 mg | Freq: Once | INTRAMUSCULAR | Status: DC | PRN

## 2020-02-17 MED ORDER — HYDROMORPHONE HCL 1 MG/ML IJ SOLN: 1.0000 mg | Freq: Once | INTRAMUSCULAR | Status: DC | PRN

## 2020-02-17 MED ORDER — FAMOTIDINE 20 MG PO TABS: 40.0000 mg | ORAL_TABLET | Freq: Once | ORAL | Status: DC | PRN

## 2020-02-17 MED ORDER — MIDAZOLAM HCL 5 MG/5ML IJ SOLN
INTRAMUSCULAR | Status: AC
  Filled 2020-02-17: qty 5

## 2020-02-17 MED ORDER — SODIUM CHLORIDE 0.9 % IV SOLN: INTRAVENOUS | Status: DC

## 2020-02-17 MED ORDER — DIPHENHYDRAMINE HCL 50 MG/ML IJ SOLN: 50.0000 mg | Freq: Once | INTRAMUSCULAR | Status: DC | PRN

## 2020-02-17 MED ORDER — CHLORHEXIDINE GLUCONATE CLOTH 2 % EX PADS: 6.0000 | MEDICATED_PAD | Freq: Every day | CUTANEOUS | Status: DC

## 2020-02-17 MED ORDER — FENTANYL CITRATE (PF) 100 MCG/2ML IJ SOLN
INTRAMUSCULAR | Status: DC | PRN
  Administered 2020-02-17: 25 ug via INTRAVENOUS
  Administered 2020-02-17: 50 ug via INTRAVENOUS

## 2020-02-17 MED ORDER — MIDAZOLAM HCL 2 MG/2ML IJ SOLN
INTRAMUSCULAR | Status: DC | PRN
  Administered 2020-02-17: 1 mg via INTRAVENOUS
  Administered 2020-02-17: 2 mg via INTRAVENOUS

## 2020-02-17 MED ORDER — SODIUM CHLORIDE 0.9 % IV SOLN
Freq: Once | INTRAVENOUS | Status: DC
  Filled 2020-02-17: qty 2

## 2020-02-17 SURGICAL SUPPLY — 11 items
DERMABOND ADVANCED (GAUZE/BANDAGES/DRESSINGS) ×1
DERMABOND ADVANCED .7 DNX12 (GAUZE/BANDAGES/DRESSINGS) ×1 IMPLANT
KIT PORT POWER 8FR ISP CVUE (Port) ×2 IMPLANT
PACK ANGIOGRAPHY (CUSTOM PROCEDURE TRAY) ×2 IMPLANT
PENCIL ELECTRO HAND CTR (MISCELLANEOUS) ×2 IMPLANT
SPONGE XRAY 4X4 16PLY STRL (MISCELLANEOUS) ×2 IMPLANT
SUT MNCRL 4-0 (SUTURE) ×1
SUT MNCRL 4-0 27XMFL (SUTURE) ×1
SUT VIC AB 3-0 CT1 27 (SUTURE) ×1
SUT VIC AB 3-0 CT1 TAPERPNT 27 (SUTURE) ×1 IMPLANT
SUTURE MNCRL 4-0 27XMF (SUTURE) ×1 IMPLANT

## 2020-02-17 NOTE — Op Note (Signed)
      [facility]       Operative Note  Date: 02/17/2020  Preoperative diagnosis:  1. Iron deficiency anemia  Postoperative diagnosis:  Same as above  Procedures: #1. Ultrasound guidance for vascular access to the right internal jugular vein. #2. Fluoroscopic guidance for placement of catheter. #3. Placement of CT compatible Port-A-Cath, right internal jugular vein.  Surgeon: Festus Barren, MD.   Anesthesia: Local with moderate conscious sedation for approximately 23  minutes using 3 mg of Versed and 75 mcg of Fentanyl  Fluoroscopy time: less than 1 minute  Contrast used: 0  Estimated blood loss: 3 cc  Indication for the procedure:  The patient is a 75 y.o.female with iron deficiency anemia and need for recurrent infusions.  The patient needs a Port-A-Cath for durable venous access, chemotherapy, lab draws, and CT scans. We are asked to place this. Risks and benefits were discussed and informed consent was obtained.  Description of procedure: The patient was brought to the vascular and interventional radiology suite.  Moderate conscious sedation was administered throughout the procedure during a face to face encounter with the patient with my supervision of the RN administering medicines and monitoring the patient's vital signs, pulse oximetry, telemetry and mental status throughout from the start of the procedure until the patient was taken to the recovery room. The right neck chest and shoulder were sterilely prepped and draped, and a sterile surgical field was created. Ultrasound was used to help visualize a patent right internal jugular vein. This was then accessed under direct ultrasound guidance without difficulty with the Seldinger needle and a permanent image was recorded. A J-wire was placed. After skin nick and dilatation, the peel-away sheath was then placed over the wire. I then anesthetized an area under the clavicle approximately 1-2 fingerbreadths. A  transverse incision was created and an inferior pocket was created with electrocautery and blunt dissection. The port was then brought onto the field, placed into the pocket and secured to the chest wall with 2 Prolene sutures. The catheter was connected to the port and tunneled from the subclavicular incision to the access site. Fluoroscopic guidance was then used to cut the catheter to an appropriate length. The catheter was then placed through the peel-away sheath and the peel-away sheath was removed. The catheter tip was parked in excellent location under fluorocoscopic guidance in the cavoatrial junction. The pocket was then irrigated with antibiotic impregnated saline and the wound was closed with a running 3-0 Vicryl and a 4-0 Monocryl. The access incision was closed with a single 4-0 Monocryl. The Huber needle was used to withdraw blood and flush the port with heparinized saline. Dermabond was then placed as a dressing. The patient tolerated the procedure well and was taken to the recovery room in stable condition.   Festus Barren 02/17/2020 12:31 PM   This note was created with Dragon Medical transcription system. Any errors in dictation are purely unintentional.

## 2020-02-17 NOTE — Discharge Instructions (Signed)
Implanted Port Insertion, Care After This sheet gives you information about how to care for yourself after your procedure. Your health care provider may also give you more specific instructions. If you have problems or questions, contact your health care provider. What can I expect after the procedure? After the procedure, it is common to have:  Discomfort at the port insertion site.  Bruising on the skin over the port. This should improve over 3-4 days. Follow these instructions at home: Port care  After your port is placed, you will get a manufacturer's information card. The card has information about your port. Keep this card with you at all times.  Take care of the port as told by your health care provider. Ask your health care provider if you or a family member can get training for taking care of the port at home. A home health care nurse may also take care of the port.  Make sure to remember what type of port you have. Incision care  Follow instructions from your health care provider about how to take care of your port insertion site. Make sure you: ? Wash your hands with soap and water before and after you change your bandage (dressing). If soap and water are not available, use hand sanitizer. ? Change your dressing as told by your health care provider. ? Leave stitches (sutures), skin glue, or adhesive strips in place. These skin closures may need to stay in place for 2 weeks or longer. If adhesive strip edges start to loosen and curl up, you may trim the loose edges. Do not remove adhesive strips completely unless your health care provider tells you to do that.  Check your port insertion site every day for signs of infection. Check for: ? Redness, swelling, or pain. ? Fluid or blood. ? Warmth. ? Pus or a bad smell.      Activity  Return to your normal activities as told by your health care provider. Ask your health care provider what activities are safe for you.  Do not  lift anything that is heavier than 10 lb (4.5 kg), or the limit that you are told, until your health care provider says that it is safe. General instructions  Take over-the-counter and prescription medicines only as told by your health care provider.  Do not take baths, swim, or use a hot tub until your health care provider approves. Ask your health care provider if you may take showers. You may only be allowed to take sponge baths.  Do not drive for 24 hours if you were given a sedative during your procedure.  Wear a medical alert bracelet in case of an emergency. This will tell any health care providers that you have a port.  Keep all follow-up visits as told by your health care provider. This is important. Contact a health care provider if:  You cannot flush your port with saline as directed, or you cannot draw blood from the port.  You have a fever or chills.  You have redness, swelling, or pain around your port insertion site.  You have fluid or blood coming from your port insertion site.  Your port insertion site feels warm to the touch.  You have pus or a bad smell coming from the port insertion site. Get help right away if:  You have chest pain or shortness of breath.  You have bleeding from your port that you cannot control. Summary  Take care of the port as told by your   health care provider. Keep the manufacturer's information card with you at all times.  Change your dressing as told by your health care provider.  Contact a health care provider if you have a fever or chills or if you have redness, swelling, or pain around your port insertion site.  Keep all follow-up visits as told by your health care provider. This information is not intended to replace advice given to you by your health care provider. Make sure you discuss any questions you have with your health care provider. Document Revised: 07/18/2017 Document Reviewed: 07/18/2017 Elsevier Patient Education   2021 [employer]  

## 2020-02-17 NOTE — Interval H&P Note (Signed)
History and Physical Interval Note:  02/17/2020 10:09 AM  Cheryl Leonard  has presented today for surgery, with the diagnosis of Porta Cath Placement  Normocytic anemia Covid  Feb 10.  The various methods of treatment have been discussed with the patient and family. After consideration of risks, benefits and other options for treatment, the patient has consented to  Procedure(s): PORTA CATH INSERTION (N/A) as a surgical intervention.  The patient's history has been reviewed, patient examined, no change in status, stable for surgery.  I have reviewed the patient's chart and labs.  Questions were answered to the patient's satisfaction.     Festus Barren

## 2020-02-17 NOTE — Telephone Encounter (Signed)
The pt's husband called and left a Vm on the nurses line wanting to know since his wife tested positive for Ecoli in her urine did we plan to proceed with the surgery on Monday per the pt's chart she is currently in the hospital under going a porta cath insertion . I made the  Pt's husband aware per VM that since his wife is under the care of the hospital he will have to speak with someone there to get an update as far as plans for his wife surgery and positive test.

## 2020-02-18 ENCOUNTER — Encounter: Payer: Self-pay | Admitting: Vascular Surgery

## 2020-02-18 ENCOUNTER — Telehealth: Payer: Self-pay | Admitting: Internal Medicine

## 2020-02-18 MED ORDER — LIDOCAINE-PRILOCAINE 2.5-2.5 % EX CREA: 1.0000 "application " | TOPICAL_CREAM | CUTANEOUS | 3 refills | Status: DC | PRN

## 2020-02-18 NOTE — Addendum Note (Signed)
Addended by: Keitha Butte on: 02/18/2020 04:12 PM   Modules accepted: Orders

## 2020-02-18 NOTE — Telephone Encounter (Signed)
Pt would like to r\s her 2/16 Venofer. Pt had her port placed and it is swollen and she doesn't want to receive treatment. Please call number attached.

## 2020-02-18 NOTE — Telephone Encounter (Signed)
Call returned. Port a cath placed yesterday and site is still tender. Patient wants to move her apts tomorrow to the first week of March. She will keep apts in 1 week as scheduled. emla cream script also sent to her pharmacy. Pt education provided on emla cream application

## 2020-02-19 ENCOUNTER — Inpatient Hospital Stay: Payer: Medicare Other

## 2020-02-26 ENCOUNTER — Other Ambulatory Visit: Payer: Self-pay

## 2020-02-26 ENCOUNTER — Encounter: Payer: Self-pay | Admitting: Urology

## 2020-02-26 ENCOUNTER — Ambulatory Visit (INDEPENDENT_AMBULATORY_CARE_PROVIDER_SITE_OTHER): Payer: Medicare Other | Admitting: Urology

## 2020-02-26 VITALS — BP 132/55 | HR 96 | Ht 65.0 in | Wt 170.0 lb

## 2020-02-26 DIAGNOSIS — N3941 Urge incontinence: Secondary | ICD-10-CM

## 2020-02-26 DIAGNOSIS — N390 Urinary tract infection, site not specified: Secondary | ICD-10-CM

## 2020-02-26 DIAGNOSIS — N3281 Overactive bladder: Secondary | ICD-10-CM | POA: Diagnosis not present

## 2020-02-26 LAB — URINALYSIS, COMPLETE
Bilirubin, UA: NEGATIVE
Glucose, UA: NEGATIVE
Ketones, UA: NEGATIVE
Leukocytes,UA: NEGATIVE
Nitrite, UA: NEGATIVE
RBC, UA: NEGATIVE
Specific Gravity, UA: 1.02 (ref 1.005–1.030)
Urobilinogen, Ur: 0.2 mg/dL (ref 0.2–1.0)
pH, UA: 5.5 (ref 5.0–7.5)

## 2020-02-26 LAB — MICROSCOPIC EXAMINATION: Bacteria, UA: NONE SEEN

## 2020-02-26 LAB — BLADDER SCAN AMB NON-IMAGING: Scan Result: 21

## 2020-02-26 NOTE — Progress Notes (Signed)
02/26/2020 9:45 AM   Cheryl Leonard 04/25/1945 [id number]  Referring provider: Danella PentonMiller, Mark F, MD [phone] [facility] [facility] [place] [id number]  Chief Complaint  Patient presents with  . Recurrent UTI    HPI: Cheryl KellsVeronica Leonard is a 75 y.o. female referred for recurrent UTIs.   [place] 12/13/2019  Had urine culture 01/01/2020 which grew E. Coli  Follow-up visit with Dr. Hyacinth MeekerMiller 01/08/2020 with UA showing 4-10 WBCs but negative culture  Was treated empirically with Macrobid  Urinalysis 01/22/2020 with >50 WBCs and nitrite positive and was treated with cefuroxime  Follow-up urinalysis 02/11/2020 was nitrite positive with 4-10 WBC/10-50 RBC and urine culture + E. coli.  Done as a follow-up to her UTI treatment and she was asymptomatic.  Was treated with a 7-day course of Augmentin  Presently asymptomatic but was seen yesterday and started on Cipro/Flagyl for diverticulitis  Renal ultrasound November 2021 showed no upper tract abnormalities  With earlier UTI did have dysuria and malodorous urine  Does have baseline urinary frequency, urgency with occasional episodes urge incontinence  Denies gross hematuria  Denies flank, abdominal or pelvic pain   PMH: Past Medical History:  Diagnosis Date  . Anemia   . Anxiety   . Arthritis    Gout  . Cataracts, both eyes   . Diabetic retinopathy (HCC)    NPDR OU  . Diabetic retinopathy (HCC)   . GERD (gastroesophageal reflux disease)   . Gout   . Headache    h/o migraines  . History of fracture of patella    right knee  . History of positive PPD    Patient always shows positive  . Hyperlipidemia   . Hypertension   . Hypertensive retinopathy    OU  . Hypothyroidism   . Lichen sclerosus 12/30/2013   of vulva  . Metatarsal fracture   . Neuropathy   . Peripheral vascular disease (HCC)   . Polyneuropathy    numbness and  tingling in feet and toes  . Renal insufficiency    Stage 3  . Sleep apnea    does not use cpap-lost weight   . Type 2 diabetes mellitus, uncontrolled (HCC)     Surgical History: Past Surgical History:  Procedure Laterality Date  . ABDOMINAL HYSTERECTOMY    . APPENDECTOMY    . BREAST REDUCTION SURGERY  2001  . CATARACT EXTRACTION    . CESAREAN SECTION  1976  . COLONOSCOPY  03/05/2013   Nml - due for repeat 03/06/2018  . COLONOSCOPY WITH PROPOFOL N/A 03/18/2019   Procedure: COLONOSCOPY WITH PROPOFOL;  Surgeon: Toledo, Boykin Nearingeodoro K, MD;  Location: [facility];  Service: Gastroenterology;  Laterality: N/A;  . DIAGNOSTIC LAPAROSCOPY    . DILATION AND CURETTAGE OF UTERUS  1989  . ENDARTERECTOMY FEMORAL Bilateral 03/09/2018   Procedure: ENDARTERECTOMY FEMORAL;  Surgeon: Annice Needyew, Jason S, MD;  Location: [facility];  Service: Vascular;  Laterality: Bilateral;  . ENDARTERECTOMY POPLITEAL Left 03/09/2018   Procedure: ENDARTERECTOMY POPLITEAL AND SFA;  Surgeon: Annice Needyew, Jason S, MD;  Location: [facility];  Service: Vascular;  Laterality: Left;  . ESOPHAGOGASTRODUODENOSCOPY  03/05/2013  . ESOPHAGOGASTRODUODENOSCOPY (EGD) WITH PROPOFOL N/A 03/18/2019   Procedure: ESOPHAGOGASTRODUODENOSCOPY (EGD) WITH PROPOFOL;  Surgeon: Toledo, Boykin Nearingeodoro K, MD;  Location: [facility];  Service: Gastroenterology;  Laterality: N/A;  . EYE SURGERY    . Eyelid Surgery  2012  . INTRAMEDULLARY (IM) NAIL INTERTROCHANTERIC Left  10/30/2015   Procedure: INTRAMEDULLARY (IM) NAIL INTERTROCHANTRIC ;  Surgeon: Kennedy Bucker, MD;  Location: [facility];  Service: Orthopedics;  Laterality: Left;  . KYPHOPLASTY N/A 10/25/2018   Procedure: L4 KYPHOPLASTY;  Surgeon: Kennedy Bucker, MD;  Location: [facility];  Service: Orthopedics;  Laterality: N/A;  . LAPAROSCOPIC HYSTERECTOMY  2000   total  . LOWER EXTREMITY ANGIOGRAPHY Left 03/08/2017   Procedure: LOWER EXTREMITY ANGIOGRAPHY;  Surgeon: Annice Needy, MD;  Location: [facility];  Service:  Cardiovascular;  Laterality: Left;  . LOWER EXTREMITY ANGIOGRAPHY Left 10/30/2017   Procedure: LOWER EXTREMITY ANGIOGRAPHY;  Surgeon: Annice Needy, MD;  Location: [facility];  Service: Cardiovascular;  Laterality: Left;  . LOWER EXTREMITY ANGIOGRAPHY Right 03/08/2018   Procedure: LOWER EXTREMITY ANGIOGRAPHY;  Surgeon: Annice Needy, MD;  Location: [facility];  Service: Cardiovascular;  Laterality: Right;  . LOWER EXTREMITY ANGIOGRAPHY Left 10/01/2018   Procedure: LOWER EXTREMITY ANGIOGRAPHY;  Surgeon: Annice Needy, MD;  Location: [facility];  Service: Cardiovascular;  Laterality: Left;  . LOWER EXTREMITY ANGIOGRAPHY Right 10/08/2018   Procedure: LOWER EXTREMITY ANGIOGRAPHY;  Surgeon: Annice Needy, MD;  Location: [facility];  Service: Cardiovascular;  Laterality: Right;  . PERIPHERAL VASCULAR INTERVENTION  03/08/2018   Procedure: PERIPHERAL VASCULAR INTERVENTION;  Surgeon: Annice Needy, MD;  Location: [facility];  Service: Cardiovascular;;  . PORTA CATH INSERTION N/A 02/17/2020   Procedure: PORTA CATH INSERTION;  Surgeon: Annice Needy, MD;  Location: [facility];  Service: Cardiovascular;  Laterality: N/A;  . REDUCTION MAMMAPLASTY  1997  . SACROPLASTY N/A 10/25/2018   Procedure: S1 SACROPLASTY;  Surgeon: Kennedy Bucker, MD;  Location: [facility];  Service: Orthopedics;  Laterality: N/A;    Home Medications:  Allergies as of 02/26/2020      Reactions   Ace Inhibitors Other (See Comments)      Medication List       Accurate as of February 26, 2020  9:45 AM. If you have any questions, ask your nurse or doctor.        STOP taking these medications   acetaminophen 500 MG tablet Commonly known as: TYLENOL Stopped by: Riki Altes, MD   amoxicillin-clavulanate 500-125 MG tablet Commonly known as: AUGMENTIN Stopped by: Riki Altes, MD   Eylea 2 MG/0.05ML Soln Generic drug: Aflibercept Stopped by: Riki Altes, MD     TAKE  these medications   ALPRAZolam 0.25 MG tablet Commonly known as: XANAX Take 0.25 mg by mouth daily as needed for anxiety or sleep.   amLODipine 5 MG tablet Commonly known as: NORVASC Take by mouth.   aspirin EC 81 MG tablet Take 81 mg by mouth daily.   cholecalciferol 1000 units tablet Commonly known as: VITAMIN D Take 1,000 Units by mouth 2 (two) times daily.   ciprofloxacin 500 MG tablet Commonly known as: CIPRO Take by mouth.   CORAL CALCIUM PO Take 1 tablet by mouth 2 (two) times daily.   denosumab 60 MG/ML Soln injection Commonly known as: PROLIA Inject 60 mg into the skin every 6 (six) months.   DULoxetine 30 MG capsule Commonly known as: CYMBALTA Take 30 mg by mouth daily.   famotidine 40 MG tablet Commonly known as: PEPCID Take 40 mg by mouth 2 (two) times daily.   furosemide 20 MG tablet Commonly known as: LASIX Take 20 mg by mouth daily as needed for fluid or edema.  gabapentin 100 MG capsule Commonly known as: NEURONTIN Take 1 capsule by mouth daily.   Insulin Pen Needle 31G X 5 MM Misc once daily.   levothyroxine 100 MCG tablet Commonly known as: SYNTHROID Take 100 mcg by mouth daily before breakfast.   lidocaine-prilocaine cream Commonly known as: EMLA Apply 1 application topically as needed (apply prior to port a cath access).   Magnesium 500 MG Tabs Take 500 mg by mouth every morning.   metFORMIN 1000 MG tablet Commonly known as: GLUCOPHAGE Take 1 tablet by mouth 2 (two) times daily with a meal.   metoprolol succinate 50 MG 24 hr tablet Commonly known as: TOPROL-XL Take by mouth.   metroNIDAZOLE 500 MG tablet Commonly known as: FLAGYL Take by mouth.   mirtazapine 15 MG tablet Commonly known as: REMERON Take by mouth.   multivitamin with minerals Tabs tablet Take 1 tablet by mouth daily.   olmesartan 20 MG tablet Commonly known as: BENICAR Take 20 mg by mouth daily.   pantoprazole 40 MG tablet Commonly known as:  PROTONIX Take 40 mg by mouth every morning.   rivaroxaban 20 MG Tabs tablet Commonly known as: XARELTO Take 1 tablet (20 mg total) by mouth daily with supper.   rosuvastatin 20 MG tablet Commonly known as: CRESTOR Take 20 mg by mouth every morning.   Evaristo Bury FlexTouch 200 UNIT/ML FlexTouch Pen Generic drug: insulin degludec Inject 30 Units as directed at bedtime.   Vascepa 1 g capsule Generic drug: icosapent Ethyl Take 1 capsule by mouth 2 (two) times daily.   vitamin B-12 1000 MCG tablet Commonly known as: CYANOCOBALAMIN Take 1,000 mcg by mouth daily.   vitamin C 1000 MG tablet Take 1,000 mg by mouth daily.   vitamin E 180 MG (400 UNITS) capsule Take 400 Units by mouth daily.   zolpidem 5 MG tablet Commonly known as: AMBIEN Take 1 tablet (5 mg total) by mouth at bedtime. What changed:   when to take this  reasons to take this       Allergies:  Allergies  Allergen Reactions  . Ace Inhibitors Other (See Comments)    Family History: Family History  Problem Relation Age of Onset  . Coronary artery disease Father   . Heart attack Father   . Coronary artery disease Mother   . Heart attack Mother   . Ovarian cancer Sister 30       sister had hormonal therapy for IVF txs-which increased risk factor for ovarian cancer  . Breast cancer Neg Hx     Social History:  reports that she quit smoking about 23 years ago. Her smoking use included cigarettes. She has a 20.00 pack-year smoking history. She has never used smokeless tobacco. She reports that she does not drink alcohol and does not use drugs.   Physical Exam: BP (!) 132/55   Pulse 96   Ht 5\' 5"  (1.651 m)   Wt 170 lb (77.1 kg)   BMI 28.29 kg/m   Constitutional:  Alert and oriented, No acute distress. HEENT: [place] AT, moist mucus membranes.  Trachea midline, no masses. Cardiovascular: No clubbing, cyanosis, or edema. Respiratory: Normal respiratory effort, no increased work of breathing. GI: Abdomen is  soft, nontender, nondistended, no abdominal masses GU: No CVA tenderness Lymph: No cervical or inguinal lymphadenopathy. Skin: No rashes, bruises or suspicious lesions. Neurologic: Grossly intact, no focal deficits, moving all 4 extremities. Psychiatric: Normal mood and affect.  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  EXAM: RENAL / URINARY TRACT ULTRASOUND COMPLETE  COMPARISON:  Prior CT from 11/26/2018.  FINDINGS: Right Kidney:  Renal measurements: 7.9 x 4.6 x 4.5 cm = volume: 85.3 mL. Renal echogenicity within normal limits. No nephrolithiasis or hydronephrosis. No focal renal mass.  Left Kidney:  Renal measurements: 10.0 x 5.4 x 3.9 cm = volume: 110.6 mL. Renal echogenicity within normal limits. No nephrolithiasis or hydronephrosis. No focal renal mass.  Bladder:  Appears normal for degree of bladder distention.  Other:  None.  IMPRESSION: Normal renal ultrasound. No hydronephrosis or other significant finding.   Electronically Signed By: Rise Mu M.D. On: 11/08/2019 04:13  No results found for this or any previous visit.  No results found for this or any previous visit.  No results found for this or any previous visit.   Assessment & Plan:    1. Recurrent UTI  Urinalysis today is clear and she is asymptomatic  Starting Cipro and Flagyl for diverticulitis  Bladder scan PVR 21 mL  Follow-up 1 month for symptom recheck and repeat PVR  If UA negative will discuss preventative UTI measures (cranberry, D-mannose)  Cystoscopy for persistent symptoms or abnormal UA  2.  Overactive bladder  We discussed medical management of overactive bladder however she does not desire to add additional medication   Riki Altes, MD  [facility]  [facility] [address] [place], [place] [address] [phone]

## 2020-02-27 ENCOUNTER — Inpatient Hospital Stay: Payer: Medicare Other

## 2020-02-27 VITALS — BP 115/63 | HR 74 | Temp 98.8°F | Resp 16

## 2020-02-27 DIAGNOSIS — D631 Anemia in chronic kidney disease: Secondary | ICD-10-CM

## 2020-02-27 DIAGNOSIS — D509 Iron deficiency anemia, unspecified: Secondary | ICD-10-CM | POA: Diagnosis not present

## 2020-02-27 MED ORDER — SODIUM CHLORIDE 0.9 % IV SOLN
Freq: Once | INTRAVENOUS | Status: AC
  Filled 2020-02-27: qty 250

## 2020-02-27 MED ORDER — HEPARIN SOD (PORK) LOCK FLUSH 100 UNIT/ML IV SOLN
500.0000 [IU] | Freq: Once | INTRAVENOUS | Status: AC | PRN
  Administered 2020-02-27: 500 [IU]
  Filled 2020-02-27: qty 5

## 2020-02-27 MED ORDER — HEPARIN SOD (PORK) LOCK FLUSH 100 UNIT/ML IV SOLN
INTRAVENOUS | Status: AC
  Filled 2020-02-27: qty 5

## 2020-02-27 MED ORDER — IRON SUCROSE 20 MG/ML IV SOLN
200.0000 mg | Freq: Once | INTRAVENOUS | Status: AC
  Administered 2020-02-27: 200 mg via INTRAVENOUS
  Filled 2020-02-27: qty 10

## 2020-02-27 MED ORDER — SODIUM CHLORIDE 0.9% FLUSH
10.0000 mL | Freq: Once | INTRAVENOUS | Status: AC | PRN
  Administered 2020-02-27: 10 mL
  Filled 2020-02-27: qty 10

## 2020-02-27 NOTE — Progress Notes (Addendum)
[facility] - Clinic Note  03/02/2020     CHIEF COMPLAINT Patient presents for Retina Follow Up   HISTORY OF PRESENT ILLNESS: Cheryl Leonard is a 75 y.o. female who presents to the clinic today for:   HPI    Retina Follow Up    Patient presents with  CRVO/BRVO.  In right eye.  This started 4 weeks ago.  I, the attending physician,  performed the HPI with the patient and updated documentation appropriately.          Comments    Patient here for 4 weeks retina follow up for BRVO OD. Patient states vision doing ok. No eye pain.       Last edited by [name], MD on 03/02/2020  4:06 PM. (History)    Patient   Referring physician: Danella Penton, MD [phone] [facility] [address] [facility] [place],  [place] [address]  HISTORICAL INFORMATION:   Selected notes from the MEDICAL RECORD NUMBER Referred by Dr. Senaida Ores for concern of DME OD Lab Results  Component Value Date   HGBA1C 5.9 (H) 10/29/2015       CURRENT MEDICATIONS: No current outpatient medications on file. (Ophthalmic Drugs)   No current facility-administered medications for this visit. (Ophthalmic Drugs)   Current Outpatient Medications (Other)  Medication Sig  . ALPRAZolam (XANAX) 0.25 MG tablet Take 0.25 mg by mouth daily as needed for anxiety or sleep.   [name] amLODipine (NORVASC) 5 MG tablet Take by mouth.  . Ascorbic Acid (VITAMIN C) 1000 MG tablet Take 1,000 mg by mouth daily.  [name] aspirin EC 81 MG tablet Take 81 mg by mouth daily.   . cholecalciferol (VITAMIN D) 1000 units tablet Take 1,000 Units by mouth 2 (two) times daily.  . ciprofloxacin (CIPRO) 500 MG tablet Take by mouth.  . CORAL CALCIUM PO Take 1 tablet by mouth 2 (two) times daily.   [name] denosumab (PROLIA) 60 MG/ML SOLN injection Inject 60 mg into the skin every 6 (six) months.   . DULoxetine (CYMBALTA) 30 MG capsule Take 30 mg by mouth daily.  . famotidine (PEPCID) 40 MG tablet Take 40 mg by mouth 2 (two)  times daily.   . furosemide (LASIX) 20 MG tablet Take 20 mg by mouth daily as needed for fluid or edema.   . gabapentin (NEURONTIN) 100 MG capsule Take 1 capsule by mouth daily.  . Insulin Pen Needle 31G X 5 MM MISC once daily.  [name] levothyroxine (SYNTHROID, LEVOTHROID) 100 MCG tablet Take 100 mcg by mouth daily before breakfast.   . lidocaine-prilocaine (EMLA) cream Apply 1 application topically as needed (apply prior to port a cath access).  . Magnesium 500 MG TABS Take 500 mg by mouth every morning.   . metFORMIN (GLUCOPHAGE) 1000 MG tablet Take 1 tablet by mouth 2 (two) times daily with a meal.  . metoprolol succinate (TOPROL-XL) 50 MG 24 hr tablet Take by mouth.  . metroNIDAZOLE (FLAGYL) 500 MG tablet Take by mouth.  . mirtazapine (REMERON) 15 MG tablet Take by mouth.  . Multiple Vitamin (MULTIVITAMIN WITH MINERALS) TABS tablet Take 1 tablet by mouth daily.  [name] olmesartan (BENICAR) 20 MG tablet Take 20 mg by mouth daily.  . pantoprazole (PROTONIX) 40 MG tablet Take 40 mg by mouth every morning.   . rivaroxaban (XARELTO) 20 MG TABS tablet Take 1 tablet (20 mg total) by mouth daily with supper.  . rosuvastatin (CRESTOR) 20 MG tablet Take 20 mg by  mouth every morning.   . TRESIBA FLEXTOUCH 200 UNIT/ML SOPN Inject 30 Units as directed at bedtime.   [name] VASCEPA 1 g CAPS Take 1 capsule by mouth 2 (two) times daily.  . vitamin B-12 (CYANOCOBALAMIN) 1000 MCG tablet Take 1,000 mcg by mouth daily.  . vitamin E 400 UNIT capsule Take 400 Units by mouth daily.  [name] zolpidem (AMBIEN) 5 MG tablet Take 1 tablet (5 mg total) by mouth at bedtime. (Patient taking differently: Take 5 mg by mouth at bedtime as needed.)   No current facility-administered medications for this visit. (Other)      REVIEW OF SYSTEMS: ROS    Positive for: Genitourinary, Musculoskeletal, Endocrine, Cardiovascular, Eyes   Negative for: Constitutional, Gastrointestinal, Neurological, Skin, HENT, Respiratory, Psychiatric, Allergic/Imm,  Heme/Lymph   Last edited by Laddie Aquas, COA on 03/02/2020  2:41 PM. (History)       ALLERGIES Allergies  Allergen Reactions  . Ace Inhibitors Other (See Comments)    PAST MEDICAL HISTORY Past Medical History:  Diagnosis Date  . Anemia   . Anxiety   . Arthritis    Gout  . Cataracts, both eyes   . Diabetic retinopathy (HCC)    NPDR OU  . Diabetic retinopathy (HCC)   . GERD (gastroesophageal reflux disease)   . Gout   . Headache    h/o migraines  . History of fracture of patella    right knee  . History of positive PPD    Patient always shows positive  . Hyperlipidemia   . Hypertension   . Hypertensive retinopathy    OU  . Hypothyroidism   . Lichen sclerosus 12/30/2013   of vulva  . Metatarsal fracture   . Neuropathy   . Peripheral vascular disease (HCC)   . Polyneuropathy    numbness and tingling in feet and toes  . Renal insufficiency    Stage 3  . Sleep apnea    does not use cpap-lost weight   . Type 2 diabetes mellitus, uncontrolled (HCC)    Past Surgical History:  Procedure Laterality Date  . ABDOMINAL HYSTERECTOMY    . APPENDECTOMY    . BREAST REDUCTION SURGERY  2001  . CATARACT EXTRACTION    . CESAREAN SECTION  1976  . COLONOSCOPY  03/05/2013   Nml - due for repeat 03/06/2018  . COLONOSCOPY WITH PROPOFOL N/A 03/18/2019   Procedure: COLONOSCOPY WITH PROPOFOL;  Surgeon: Toledo, [name], MD;  Location: [facility];  Service: Gastroenterology;  Laterality: N/A;  . DIAGNOSTIC LAPAROSCOPY    . DILATION AND CURETTAGE OF UTERUS  1989  . ENDARTERECTOMY FEMORAL Bilateral 03/09/2018   Procedure: ENDARTERECTOMY FEMORAL;  Surgeon: Annice Needy, MD;  Location: [facility];  Service: Vascular;  Laterality: Bilateral;  . ENDARTERECTOMY POPLITEAL Left 03/09/2018   Procedure: ENDARTERECTOMY POPLITEAL AND SFA;  Surgeon: Annice Needy, MD;  Location: [facility];  Service: Vascular;  Laterality: Left;  . ESOPHAGOGASTRODUODENOSCOPY  03/05/2013  . ESOPHAGOGASTRODUODENOSCOPY  (EGD) WITH PROPOFOL N/A 03/18/2019   Procedure: ESOPHAGOGASTRODUODENOSCOPY (EGD) WITH PROPOFOL;  Surgeon: Toledo, [name], MD;  Location: [facility];  Service: Gastroenterology;  Laterality: N/A;  . EYE SURGERY    . Eyelid Surgery  2012  . INTRAMEDULLARY (IM) NAIL INTERTROCHANTERIC Left 10/30/2015   Procedure: INTRAMEDULLARY (IM) NAIL INTERTROCHANTRIC ;  Surgeon: Kennedy Bucker, MD;  Location: [facility];  Service: Orthopedics;  Laterality: Left;  . KYPHOPLASTY N/A 10/25/2018   Procedure: L4 KYPHOPLASTY;  Surgeon: Kennedy Bucker, MD;  Location: [facility];  Service: Orthopedics;  Laterality: N/A;  . LAPAROSCOPIC HYSTERECTOMY  2000   total  . LOWER EXTREMITY ANGIOGRAPHY Left 03/08/2017   Procedure: LOWER EXTREMITY ANGIOGRAPHY;  Surgeon: Annice Needy, MD;  Location: [facility];  Service: Cardiovascular;  Laterality: Left;  . LOWER EXTREMITY ANGIOGRAPHY Left 10/30/2017   Procedure: LOWER EXTREMITY ANGIOGRAPHY;  Surgeon: Annice Needy, MD;  Location: [facility];  Service: Cardiovascular;  Laterality: Left;  . LOWER EXTREMITY ANGIOGRAPHY Right 03/08/2018   Procedure: LOWER EXTREMITY ANGIOGRAPHY;  Surgeon: Annice Needy, MD;  Location: [facility];  Service: Cardiovascular;  Laterality: Right;  . LOWER EXTREMITY ANGIOGRAPHY Left 10/01/2018   Procedure: LOWER EXTREMITY ANGIOGRAPHY;  Surgeon: Annice Needy, MD;  Location: [facility];  Service: Cardiovascular;  Laterality: Left;  . LOWER EXTREMITY ANGIOGRAPHY Right 10/08/2018   Procedure: LOWER EXTREMITY ANGIOGRAPHY;  Surgeon: Annice Needy, MD;  Location: [facility];  Service: Cardiovascular;  Laterality: Right;  . PERIPHERAL VASCULAR INTERVENTION  03/08/2018   Procedure: PERIPHERAL VASCULAR INTERVENTION;  Surgeon: Annice Needy, MD;  Location: [facility];  Service: Cardiovascular;;  . PORTA CATH INSERTION N/A 02/17/2020   Procedure: PORTA CATH INSERTION;  Surgeon: Annice Needy, MD;  Location: [facility];  Service: Cardiovascular;  Laterality: N/A;  . REDUCTION MAMMAPLASTY  1997  . SACROPLASTY N/A 10/25/2018   Procedure: S1 SACROPLASTY;  Surgeon: Kennedy Bucker, MD;  Location: [facility];  Service: Orthopedics;  Laterality: N/A;    FAMILY HISTORY Family History  Problem Relation Age of Onset  . Coronary artery disease Father   . Heart attack Father   . Coronary artery disease Mother   . Heart attack Mother   . Ovarian cancer Sister 1       sister had hormonal therapy for IVF txs-which increased risk factor for ovarian cancer  . Breast cancer Neg Hx     SOCIAL HISTORY Social History   Tobacco Use  . Smoking status: Former Smoker    Packs/day: 1.00    Years: 20.00    Pack years: 20.00    Types: Cigarettes    Quit date: 03/07/1996    Years since quitting: 24.0  . Smokeless tobacco: Never Used  . Tobacco comment: started smoking at age 6  Vaping Use  . Vaping Use: Never used  Substance Use Topics  . Alcohol use: No    Alcohol/week: 0.0 standard drinks  . Drug use: No         OPHTHALMIC EXAM:  Base Eye Exam    Visual Acuity (Snellen - Linear)      Right Left   Dist [place] 20/40 20/20   Dist ph Jansen NI        Tonometry (Tonopen, 2:38 PM)      Right Left   Pressure 19 18       Pupils      Dark Light Shape React APD   Right 3 2 Round Brisk None   Left 3 2 Round Brisk None       Visual Fields (Counting fingers)      Left Right    Full Full       Extraocular Movement      Right Left    Full Full       Neuro/Psych    Oriented x3: Yes   Mood/Affect: Normal       Dilation    Both eyes: 1.0% Mydriacyl, 2.5% Phenylephrine @  2:38 PM        Slit Lamp and Fundus Exam    External Exam      Right Left   External Normal Normal       Slit Lamp Exam      Right Left   Lids/Lashes dermatochalasis dermatochalasis   Conjunctiva/Sclera White and quiet White and quiet   Cornea arcus; well healed cataract wound; 2-3+ diffuse Punctate epithelial erosions,  decreased TBUT, mild Anterior basement membrane dystrophy superiorly arcus; well healed cataract wound, 2-3+ diffuse Punctate epithelial erosions, irregualr epi surface, decreased TBUT   Anterior Chamber Deep and quiet Deep and quiet   Iris Round and dilated Round and dilated   Lens PCIOL; open PC PCIOL; open PC   Vitreous syneresis, Posterior vitreous detachment, vitreous condensations inferiorly syneresis, Posterior vitreous detachment       Fundus Exam      Right Left   Disc Superior hyperemia and mild edema - improved, mild Pallor Pink and Sharp   C/D Ratio 0.5 0.5   Macula Flat, Blunted foveal reflex, cystic changes improved, +Epiretinal membrane, no heme flat; good foveal reflex, no heme or edema, small pigment clump IT to fovea   Vessels attenuated, Tortuous Vascular attenuation   Periphery attached; MAs/DBH temporal macula Attached, no heme          IMAGING AND PROCEDURES  Imaging and Procedures for 04/25/17  OCT, Retina - OU - Both Eyes       Right Eye Quality was good. Central Foveal Thickness: 342. Progression has improved. Findings include abnormal foveal contour, epiretinal membrane, no SRF, intraretinal fluid (Interval improvement in IRF and foveal contour).   Left Eye Quality was good. Central Foveal Thickness: 282. Progression has been stable. Findings include normal foveal contour, no IRF, no SRF (Trace ERM).   Notes *Images captured and stored on drive  Diagnosis / Impression:  OD: BRVO -- Interval improvement in IRF and foveal contour OS: NFP; no IRF/SRF--stable, trace ERM  Clinical management:  See below  Abbreviations: NFP - Normal foveal profile. CME - cystoid macular edema. PED - pigment epithelial detachment. IRF - intraretinal fluid. SRF - subretinal fluid. EZ - ellipsoid zone. ERM - epiretinal membrane. ORA - outer retinal atrophy. ORT - outer retinal tubulation. SRHM - subretinal hyper-reflective material        Intravitreal Injection,  Pharmacologic Agent - OD - Right Eye       Time Out 03/02/2020. 3:04 PM. Confirmed correct patient, procedure, site, and patient consented.   Anesthesia Topical anesthesia was used. Anesthetic medications included Lidocaine 2%, Proparacaine 0.5%.   Procedure Preparation included 5% betadine to ocular surface, eyelid speculum. A (32g) needle was used.   Injection:  2 mg aflibercept Gretta Cool) SOLN   NDC: L6038910, Lot: [id number], Expiration date: 06/02/2020   Route: Intravitreal, Site: Right Eye, Waste: 0.05 mg  Post-op Post injection exam found visual acuity of at least counting fingers. The patient tolerated the procedure well. There were no complications. The patient received written and verbal post procedure care education. Post injection medications were not given.                 ASSESSMENT/PLAN:    ICD-10-CM   1. Branch retinal vein occlusion of right eye with macular edema  H34.8310 Intravitreal Injection, Pharmacologic Agent - OD - Right Eye    aflibercept (EYLEA) SOLN 2 mg  2. Retinal edema  H35.81 OCT, Retina - OU - Both Eyes  3. Both eyes affected by mild  nonproliferative diabetic retinopathy with macular edema, associated with type 2 diabetes mellitus (HCC)  Z61.0960E11.3213   4. Essential hypertension  I10   5. Hypertensive retinopathy of both eyes  H35.033   6. Epiretinal membrane (ERM) of right eye  H35.371   7. Pseudophakia of both eyes  Z96.1     1,2. BRVO w/ CME OD  - by history, pt states symptoms first noticed 2 wks prior to presentation, but reports changes may have occurred prior  - initial exam with differential tortuosity of vessels (OD > OS)  - FA (02.10.20) shows mild late staining / leakage in macula, staining / leakage of disc -- improving CME  - differential includes DM2 (DME), hypertensive retinopathy, inflammatory etiology / uveitis  - S/P IVA OD #1 (02.08.19), #2 (03.11.19), #3 (04.09.19), #4 (05.20.19), #5 (02.10.20)  - gave IVA OD on 2.10.20  due to pending Eylea4U for 2020 -- resulted in increased IRF/CME  - review of OCTs show persistent IRF and cystic changes --  resistance to IVA   - June 2019 -- switched therapies: S/P IVE OD #1 (06.24.19), #2 (07.24.19), #3 (09.04.19), #4 (10.30.19),#5 (12.30.19), #6 (03.23.20), #7 (05.05.20), #8 (07.16.20), #9 (07.17.20), #10 (08.28.20), #11 (10.13.20), # 12 (11.17.20), #13 (2.8.21), #14 (03.09.21), #15 (04.13.21), #16 (05.11.21), #17 (06.17.21), #18 (07.23.21), #19 (08.30.21), #20 (10.04.21), #21 (11.08.21), #22 (12.08.21), #23 (01.31.22)  - OCT today shows interval improvement in IRF OD  - BCVA 20/40 -- stable  - Eylea4U benefits investigation completed and pt approved for IVE for 2022  - recommend IVE OD #24 today, 02.28.22  - RBA of procedure discussed, questions answered  - informed consent obtained  - see procedure note  - Eylea informed consent form obtained and scanned on 11.19.2020  - f/u 4-5 weeks  -- DFE/OCT/possible injection  3. Mild nonproliferative diabetic retinopathy, both eyes  - The incidence, risk factors for progression, natural history and treatment options for diabetic retinopathy were discussed with patient.    - The need for close monitoring of blood glucose, blood pressure, and serum lipids, avoiding cigarette or any type of tobacco, and the need for long term follow up was also discussed with patient.  - could be contributing to CME OD  - OS with minimal diabetic retinopathy  - continue to monitor  4,5. Hypertensive retinopathy OU - stable  - as above, may have contributing to CME OD  - discussed importance of tight BP control  - monitor  6. Epiretinal membrane, right eye   - stable nasal ERM  - no indication for surgery at this time  7. Pseudophakia OU  - [facility]  - doing well  - monitor    Ophthalmic Meds Ordered this visit:  Meds ordered this encounter  Medications  . aflibercept (EYLEA) SOLN 2 mg       Return  for f/u 4-5 weeks, BRVO OD, DFE, OCT.  This document serves as a record of services personally performed by Karie ChimeraBrian G. Basilio Meadow, MD, PhD. It was created on their behalf by Herby AbrahamAshley English, COA, an ophthalmic technician. The creation of this record is the provider's dictation and/or activities during the visit.    Electronically signed by: Herby AbrahamAshley English, COA 02.24.2022 4:08 PM   This document serves as a record of services personally performed by Karie ChimeraBrian G. Ivyana Locey, MD, PhD. It was created on their behalf by Glee ArvinAmanda J. Manson PasseyBrown, OA an ophthalmic technician. The creation of this record is the provider's dictation and/or activities  during the visit.    Electronically signed by: Glee Arvin. Manson Passey, [place] 02.28.2022 4:08 PM  Karie Chimera, M.D., Ph.D. Diseases & Surgery of the Retina and Vitreous [facility] 03/02/2020   I have reviewed the above documentation for accuracy and completeness, and I agree with the above. Karie Chimera, M.D., Ph.D. 03/02/20 4:08 PM   Abbreviations: M myopia (nearsighted); A astigmatism; H hyperopia (farsighted); P presbyopia; Mrx spectacle prescription;  CTL contact lenses; OD right eye; OS left eye; OU both eyes  XT exotropia; ET esotropia; PEK punctate epithelial keratitis; PEE punctate epithelial erosions; DES dry eye syndrome; MGD meibomian gland dysfunction; ATs artificial tears; PFAT's preservative free artificial tears; NSC nuclear sclerotic cataract; PSC posterior subcapsular cataract; ERM epi-retinal membrane; PVD posterior vitreous detachment; RD retinal detachment; DM diabetes mellitus; DR diabetic retinopathy; NPDR non-proliferative diabetic retinopathy; PDR proliferative diabetic retinopathy; CSME clinically significant macular edema; DME diabetic macular edema; dbh dot blot hemorrhages; CWS cotton wool spot; POAG primary open angle glaucoma; C/D cup-to-disc ratio; HVF humphrey visual field; GVF goldmann visual field; OCT optical coherence  tomography; IOP intraocular pressure; BRVO Branch retinal vein occlusion; CRVO central retinal vein occlusion; CRAO central retinal artery occlusion; BRAO branch retinal artery occlusion; RT retinal tear; SB scleral buckle; PPV pars plana vitrectomy; VH Vitreous hemorrhage; PRP panretinal laser photocoagulation; IVK intravitreal kenalog; VMT vitreomacular traction; MH Macular hole;  NVD neovascularization of the disc; NVE neovascularization elsewhere; AREDS age related eye disease study; ARMD age related macular degeneration; POAG primary open angle glaucoma; EBMD epithelial/anterior basement membrane dystrophy; ACIOL anterior chamber intraocular lens; IOL intraocular lens; PCIOL posterior chamber intraocular lens; Phaco/IOL phacoemulsification with intraocular lens placement; PRK photorefractive keratectomy; LASIK laser assisted in situ keratomileusis; HTN hypertension; DM diabetes mellitus; COPD chronic obstructive pulmonary disease

## 2020-03-02 ENCOUNTER — Other Ambulatory Visit: Payer: Self-pay

## 2020-03-02 ENCOUNTER — Encounter (INDEPENDENT_AMBULATORY_CARE_PROVIDER_SITE_OTHER): Payer: Self-pay | Admitting: Ophthalmology

## 2020-03-02 ENCOUNTER — Ambulatory Visit (INDEPENDENT_AMBULATORY_CARE_PROVIDER_SITE_OTHER): Payer: Medicare Other | Admitting: Ophthalmology

## 2020-03-02 DIAGNOSIS — E113213 Type 2 diabetes mellitus with mild nonproliferative diabetic retinopathy with macular edema, bilateral: Secondary | ICD-10-CM

## 2020-03-02 DIAGNOSIS — H3581 Retinal edema: Secondary | ICD-10-CM | POA: Diagnosis not present

## 2020-03-02 DIAGNOSIS — I10 Essential (primary) hypertension: Secondary | ICD-10-CM

## 2020-03-02 DIAGNOSIS — H35371 Puckering of macula, right eye: Secondary | ICD-10-CM

## 2020-03-02 DIAGNOSIS — H4710 Unspecified papilledema: Secondary | ICD-10-CM

## 2020-03-02 DIAGNOSIS — H43391 Other vitreous opacities, right eye: Secondary | ICD-10-CM

## 2020-03-02 DIAGNOSIS — H35033 Hypertensive retinopathy, bilateral: Secondary | ICD-10-CM

## 2020-03-02 DIAGNOSIS — Z961 Presence of intraocular lens: Secondary | ICD-10-CM

## 2020-03-02 DIAGNOSIS — H34831 Tributary (branch) retinal vein occlusion, right eye, with macular edema: Secondary | ICD-10-CM | POA: Diagnosis not present

## 2020-03-02 MED ORDER — AFLIBERCEPT 2MG/0.05ML IZ SOLN FOR KALEIDOSCOPE
2.0000 mg | INTRAVITREAL | Status: AC | PRN
  Administered 2020-03-02: 2 mg via INTRAVITREAL

## 2020-03-05 ENCOUNTER — Inpatient Hospital Stay: Payer: Medicare Other | Attending: Internal Medicine

## 2020-03-05 VITALS — BP 119/53 | HR 82 | Temp 98.3°F

## 2020-03-05 DIAGNOSIS — D631 Anemia in chronic kidney disease: Secondary | ICD-10-CM

## 2020-03-05 DIAGNOSIS — D509 Iron deficiency anemia, unspecified: Secondary | ICD-10-CM | POA: Insufficient documentation

## 2020-03-05 DIAGNOSIS — N183 Chronic kidney disease, stage 3 unspecified: Secondary | ICD-10-CM

## 2020-03-05 MED ORDER — SODIUM CHLORIDE 0.9 % IV SOLN
Freq: Once | INTRAVENOUS | Status: AC
  Filled 2020-03-05: qty 250

## 2020-03-05 MED ORDER — HEPARIN SOD (PORK) LOCK FLUSH 100 UNIT/ML IV SOLN
500.0000 [IU] | Freq: Once | INTRAVENOUS | Status: AC | PRN
  Administered 2020-03-05: 500 [IU]
  Filled 2020-03-05: qty 5

## 2020-03-05 MED ORDER — IRON SUCROSE 20 MG/ML IV SOLN
200.0000 mg | Freq: Once | INTRAVENOUS | Status: AC
  Administered 2020-03-05: 200 mg via INTRAVENOUS
  Filled 2020-03-05: qty 10

## 2020-03-05 MED ORDER — HEPARIN SOD (PORK) LOCK FLUSH 100 UNIT/ML IV SOLN
INTRAVENOUS | Status: AC
  Filled 2020-03-05: qty 5

## 2020-03-16 ENCOUNTER — Other Ambulatory Visit: Payer: Self-pay | Admitting: Internal Medicine

## 2020-03-16 DIAGNOSIS — R1032 Left lower quadrant pain: Secondary | ICD-10-CM

## 2020-03-26 ENCOUNTER — Ambulatory Visit (INDEPENDENT_AMBULATORY_CARE_PROVIDER_SITE_OTHER): Payer: Medicare Other | Admitting: Urology

## 2020-03-26 ENCOUNTER — Encounter: Payer: Self-pay | Admitting: Urology

## 2020-03-26 ENCOUNTER — Other Ambulatory Visit: Payer: Self-pay

## 2020-03-26 VITALS — BP 124/57 | HR 88 | Ht 65.0 in | Wt 163.0 lb

## 2020-03-26 DIAGNOSIS — N3281 Overactive bladder: Secondary | ICD-10-CM

## 2020-03-26 DIAGNOSIS — N390 Urinary tract infection, site not specified: Secondary | ICD-10-CM | POA: Diagnosis not present

## 2020-03-26 DIAGNOSIS — N3941 Urge incontinence: Secondary | ICD-10-CM

## 2020-03-26 MED ORDER — MIRABEGRON ER 25 MG PO TB24: 25.0000 mg | ORAL_TABLET | Freq: Every day | ORAL | 0 refills | Status: DC

## 2020-03-26 NOTE — Progress Notes (Unsigned)
03/26/2020 9:28 AM   Cheryl Leonard Jul 31, 1945 [id number]  Referring provider: Danella Penton, MD [phone] [facility] [address] [facility] [place],  [place] [address]  Chief Complaint  Patient presents with  . Recurrent UTI    HPI: 75 y.o. female presents for follow-up.   Initially seen 02/26/2020 for recurrent UTI  Denies recurrent infections since her last visit  Has been seen by gastroenterology for abdominal pain and diarrhea and is scheduled for CT abdomen/pelvis  Complains of urinary frequency urgency with urge incontinence and bladder spasm  Denies gross hematuria   PMH: Past Medical History:  Diagnosis Date  . Anemia   . Anxiety   . Arthritis    Gout  . Cataracts, both eyes   . Diabetic retinopathy (HCC)    NPDR OU  . Diabetic retinopathy (HCC)   . GERD (gastroesophageal reflux disease)   . Gout   . Headache    h/o migraines  . History of fracture of patella    right knee  . History of positive PPD    Patient always shows positive  . Hyperlipidemia   . Hypertension   . Hypertensive retinopathy    OU  . Hypothyroidism   . Lichen sclerosus 12/30/2013   of vulva  . Metatarsal fracture   . Neuropathy   . Peripheral vascular disease (HCC)   . Polyneuropathy    numbness and tingling in feet and toes  . Renal insufficiency    Stage 3  . Sleep apnea    does not use cpap-lost weight   . Type 2 diabetes mellitus, uncontrolled (HCC)     Surgical History: Past Surgical History:  Procedure Laterality Date  . ABDOMINAL HYSTERECTOMY    . APPENDECTOMY    . BREAST REDUCTION SURGERY  2001  . CATARACT EXTRACTION    . CESAREAN SECTION  1976  . COLONOSCOPY  03/05/2013   Nml - due for repeat 03/06/2018  . COLONOSCOPY WITH PROPOFOL N/A 03/18/2019   Procedure: COLONOSCOPY WITH PROPOFOL;  Surgeon: Toledo, [name], MD;  Location: [facility];  Service: Gastroenterology;  Laterality: N/A;  . DIAGNOSTIC LAPAROSCOPY    . DILATION AND  CURETTAGE OF UTERUS  1989  . ENDARTERECTOMY FEMORAL Bilateral 03/09/2018   Procedure: ENDARTERECTOMY FEMORAL;  Surgeon: Annice Needy, MD;  Location: [facility];  Service: Vascular;  Laterality: Bilateral;  . ENDARTERECTOMY POPLITEAL Left 03/09/2018   Procedure: ENDARTERECTOMY POPLITEAL AND SFA;  Surgeon: Annice Needy, MD;  Location: [facility];  Service: Vascular;  Laterality: Left;  . ESOPHAGOGASTRODUODENOSCOPY  03/05/2013  . ESOPHAGOGASTRODUODENOSCOPY (EGD) WITH PROPOFOL N/A 03/18/2019   Procedure: ESOPHAGOGASTRODUODENOSCOPY (EGD) WITH PROPOFOL;  Surgeon: Toledo, [name], MD;  Location: [facility];  Service: Gastroenterology;  Laterality: N/A;  . EYE SURGERY    . Eyelid Surgery  2012  . INTRAMEDULLARY (IM) NAIL INTERTROCHANTERIC Left 10/30/2015   Procedure: INTRAMEDULLARY (IM) NAIL INTERTROCHANTRIC ;  Surgeon: Kennedy Bucker, MD;  Location: [facility];  Service: Orthopedics;  Laterality: Left;  . KYPHOPLASTY N/A 10/25/2018   Procedure: L4 KYPHOPLASTY;  Surgeon: Kennedy Bucker, MD;  Location: [facility];  Service: Orthopedics;  Laterality: N/A;  . LAPAROSCOPIC HYSTERECTOMY  2000   total  . LOWER EXTREMITY ANGIOGRAPHY Left 03/08/2017   Procedure: LOWER EXTREMITY ANGIOGRAPHY;  Surgeon: Annice Needy, MD;  Location: [facility];  Service: Cardiovascular;  Laterality: Left;  . LOWER EXTREMITY ANGIOGRAPHY Left 10/30/2017   Procedure: LOWER EXTREMITY ANGIOGRAPHY;  Surgeon: Annice Needy, MD;  Location:  [facility];  Service: Cardiovascular;  Laterality: Left;  . LOWER EXTREMITY ANGIOGRAPHY Right 03/08/2018   Procedure: LOWER EXTREMITY ANGIOGRAPHY;  Surgeon: Annice Needy, MD;  Location: [facility];  Service: Cardiovascular;  Laterality: Right;  . LOWER EXTREMITY ANGIOGRAPHY Left 10/01/2018   Procedure: LOWER EXTREMITY ANGIOGRAPHY;  Surgeon: Annice Needy, MD;  Location: [facility];  Service: Cardiovascular;  Laterality: Left;  . LOWER EXTREMITY ANGIOGRAPHY Right 10/08/2018    Procedure: LOWER EXTREMITY ANGIOGRAPHY;  Surgeon: Annice Needy, MD;  Location: [facility];  Service: Cardiovascular;  Laterality: Right;  . PERIPHERAL VASCULAR INTERVENTION  03/08/2018   Procedure: PERIPHERAL VASCULAR INTERVENTION;  Surgeon: Annice Needy, MD;  Location: [facility];  Service: Cardiovascular;;  . PORTA CATH INSERTION N/A 02/17/2020   Procedure: PORTA CATH INSERTION;  Surgeon: Annice Needy, MD;  Location: [facility];  Service: Cardiovascular;  Laterality: N/A;  . REDUCTION MAMMAPLASTY  1997  . SACROPLASTY N/A 10/25/2018   Procedure: S1 SACROPLASTY;  Surgeon: Kennedy Bucker, MD;  Location: [facility];  Service: Orthopedics;  Laterality: N/A;    Home Medications:  Allergies as of 03/26/2020      Reactions   Ace Inhibitors Other (See Comments)      Medication List       Accurate as of March 26, 2020  9:28 AM. If you have any questions, ask your nurse or doctor.        ALPRAZolam 0.25 MG tablet Commonly known as: XANAX Take 0.25 mg by mouth daily as needed for anxiety or sleep.   amLODipine 5 MG tablet Commonly known as: NORVASC Take by mouth.   aspirin EC 81 MG tablet Take 81 mg by mouth daily.   cholecalciferol 1000 units tablet Commonly known as: VITAMIN D Take 1,000 Units by mouth 2 (two) times daily.   CORAL CALCIUM PO Take 1 tablet by mouth 2 (two) times daily.   denosumab 60 MG/ML Soln injection Commonly known as: PROLIA Inject 60 mg into the skin every 6 (six) months.   DULoxetine 30 MG capsule Commonly known as: CYMBALTA Take 30 mg by mouth daily.   famotidine 40 MG tablet Commonly known as: PEPCID Take 40 mg by mouth 2 (two) times daily.   furosemide 20 MG tablet Commonly known as: LASIX Take 20 mg by mouth daily as needed for fluid or edema.   gabapentin 100 MG capsule Commonly known as: NEURONTIN Take 1 capsule by mouth daily.   Insulin Pen Needle 31G X 5 MM Misc once daily.   levothyroxine 100 MCG  tablet Commonly known as: SYNTHROID Take 100 mcg by mouth daily before breakfast.   lidocaine-prilocaine cream Commonly known as: EMLA Apply 1 application topically as needed (apply prior to port a cath access).   Magnesium 500 MG Tabs Take 500 mg by mouth every morning.   metFORMIN 1000 MG tablet Commonly known as: GLUCOPHAGE Take 1 tablet by mouth 2 (two) times daily with a meal.   metoprolol succinate 50 MG 24 hr tablet Commonly known as: TOPROL-XL Take by mouth.   mirtazapine 15 MG tablet Commonly known as: REMERON Take by mouth.   multivitamin with minerals Tabs tablet Take 1 tablet by mouth daily.   olmesartan 20 MG tablet Commonly known as: BENICAR Take 20 mg by mouth daily.   pantoprazole 40 MG tablet Commonly known as: PROTONIX Take 40 mg by mouth every morning.   rivaroxaban 20 MG Tabs tablet Commonly  known as: XARELTO Take 1 tablet (20 mg total) by mouth daily with supper.   rosuvastatin 20 MG tablet Commonly known as: CRESTOR Take 20 mg by mouth every morning.   Evaristo Bury FlexTouch 200 UNIT/ML FlexTouch Pen Generic drug: insulin degludec Inject 30 Units as directed at bedtime.   Vascepa 1 g capsule Generic drug: icosapent Ethyl Take 1 capsule by mouth 2 (two) times daily.   vitamin B-12 1000 MCG tablet Commonly known as: CYANOCOBALAMIN Take 1,000 mcg by mouth daily.   vitamin C 1000 MG tablet Take 1,000 mg by mouth daily.   vitamin E 180 MG (400 UNITS) capsule Take 400 Units by mouth daily.   zolpidem 5 MG tablet Commonly known as: AMBIEN Take 1 tablet (5 mg total) by mouth at bedtime. What changed:   when to take this  reasons to take this       Allergies:  Allergies  Allergen Reactions  . Ace Inhibitors Other (See Comments)    Family History: Family History  Problem Relation Age of Onset  . Coronary artery disease Father   . Heart attack Father   . Coronary artery disease Mother   . Heart attack Mother   . Ovarian cancer  Sister 23       sister had hormonal therapy for IVF txs-which increased risk factor for ovarian cancer  . Breast cancer Neg Hx     Social History:  reports that she quit smoking about 24 years ago. Her smoking use included cigarettes. She has a 20.00 pack-year smoking history. She has never used smokeless tobacco. She reports that she does not drink alcohol and does not use drugs.   Physical Exam: BP (!) 124/57   Pulse 88   Ht 5\' 5"  (1.651 m)   Wt 163 lb (73.9 kg)   BMI 27.12 kg/m   Constitutional:  Alert and oriented, No acute distress. HEENT: [place] AT, moist mucus membranes.  Trachea midline, no masses. Cardiovascular: No clubbing, cyanosis, or edema. Respiratory: Normal respiratory effort, no increased work of breathing. Neurologic: Grossly intact, no focal deficits, moving all 4 extremities. Psychiatric: Normal mood and affect.  Laboratory Data:  Urinalysis Dipstick 1+ ketones/1+ protein Microscopy negative  Assessment & Plan:    1.  Overactive bladder with urge incontinence  Trial Myrbetriq 25 mg daily  PA follow-up 1 month for symptom reassessment  2.  Recurrent UTI  Last + culture 02/11/2020  Urinalysis today clear   04/10/2020, MD  [facility] [address] [place], [place] [phone]

## 2020-03-27 ENCOUNTER — Encounter: Payer: Self-pay | Admitting: Urology

## 2020-03-27 LAB — URINALYSIS, COMPLETE
Bilirubin, UA: NEGATIVE
Glucose, UA: NEGATIVE
Leukocytes,UA: NEGATIVE
Nitrite, UA: NEGATIVE
RBC, UA: NEGATIVE
Specific Gravity, UA: 1.025 (ref 1.005–1.030)
Urobilinogen, Ur: 0.2 mg/dL (ref 0.2–1.0)
pH, UA: 5 (ref 5.0–7.5)

## 2020-03-27 LAB — MICROSCOPIC EXAMINATION: Bacteria, UA: NONE SEEN

## 2020-04-01 ENCOUNTER — Other Ambulatory Visit: Payer: Self-pay

## 2020-04-01 ENCOUNTER — Ambulatory Visit
Admission: RE | Admit: 2020-04-01 | Discharge: 2020-04-01 | Disposition: A | Discharge status: Home / Self Care | Payer: Medicare Other | Source: Ambulatory Visit | Attending: Internal Medicine | Admitting: Internal Medicine

## 2020-04-01 DIAGNOSIS — R1032 Left lower quadrant pain: Secondary | ICD-10-CM | POA: Insufficient documentation

## 2020-04-01 LAB — POCT I-STAT CREATININE: Creatinine, Ser: 1.1 mg/dL — ABNORMAL HIGH (ref 0.44–1.00)

## 2020-04-01 IMAGING — CT CT ABD-PELV W/ CM
2 of 5 series · 15 of 46 positions shown, 17 images · IV contrast (omnipaque)
Comparison: CT abdomen pelvis [DATE]

CLINICAL DATA: having LLQ pain for past 2 months. She denies any
NVD. She deniesany swelling or lumps in that area. Hx of
hysterectomy and appendectomy. She states when she has a BM it
starts to hurt more than usual

EXAM:
CT ABDOMEN AND PELVIS WITH CONTRAST
TECHNIQUE: Multidetector CT imaging of the abdomen and pelvis was performed
using the standard protocol following bolus administration of
intravenous contrast.
CONTRAST:  100mL OMNIPAQUE IOHEXOL 300 MG/ML  SOLN

[Series 2: abd pelvis 5.00 · axial · 0.91mm/px · z∈[-1551,-1131]mm · 12 of 96 slices shown, 14 images]
[im 6/96  soft-tissue]
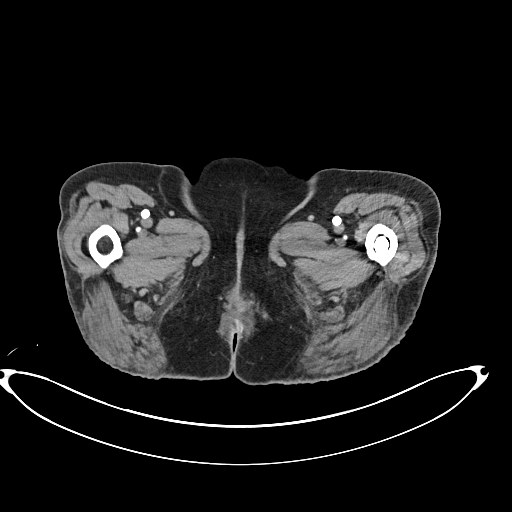
[im 6/96  bone]
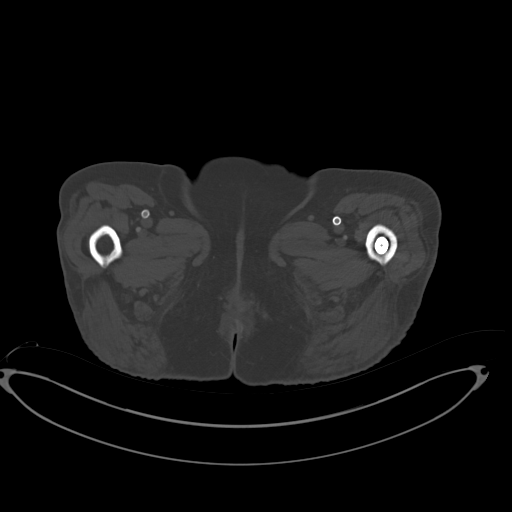
[im 16/96  soft-tissue]
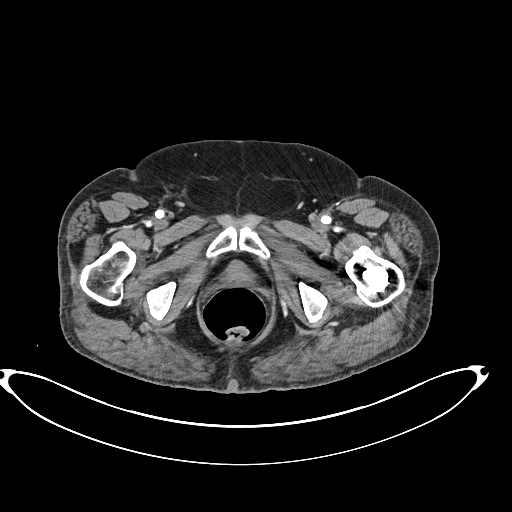
[im 22/96  soft-tissue]
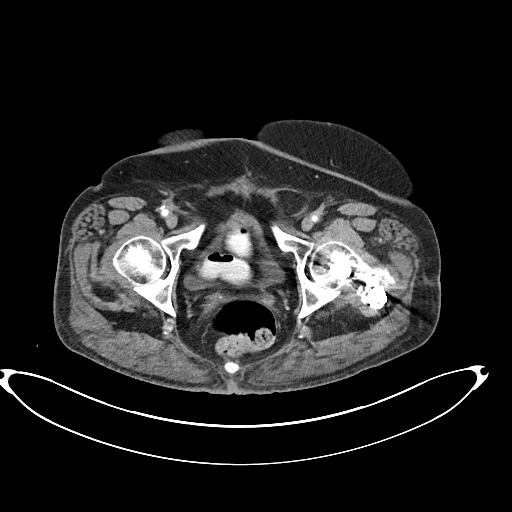
[im 27/96  soft-tissue]
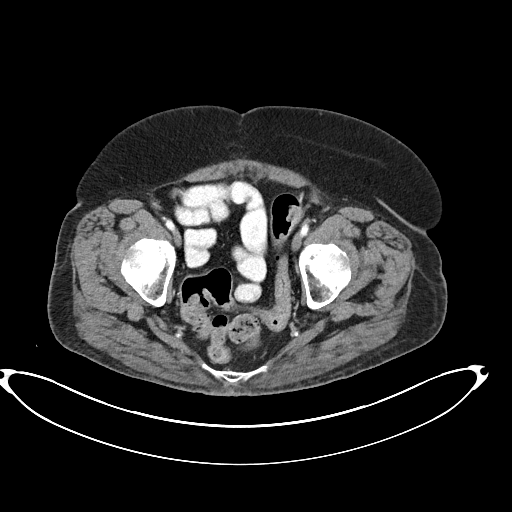
[im 37/96  soft-tissue]
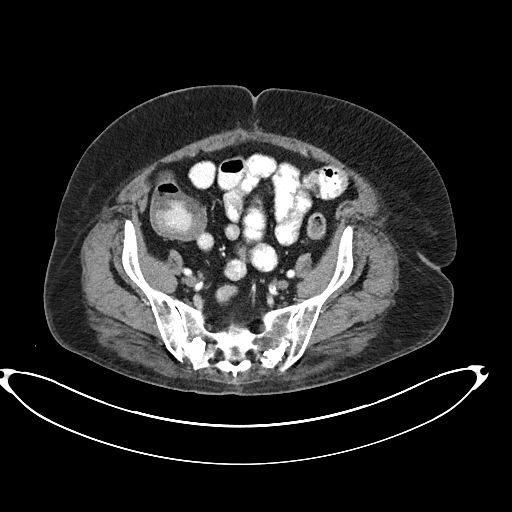
[im 43/96  soft-tissue]
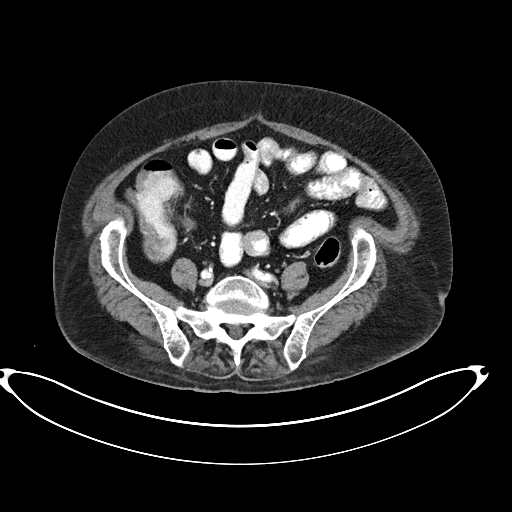
[im 53/96  soft-tissue]
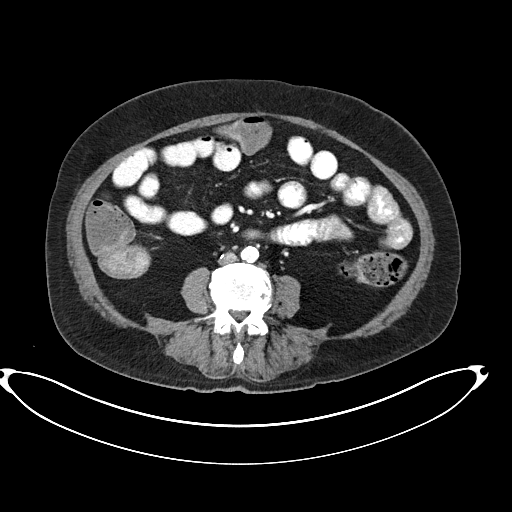
[im 59/96  soft-tissue]
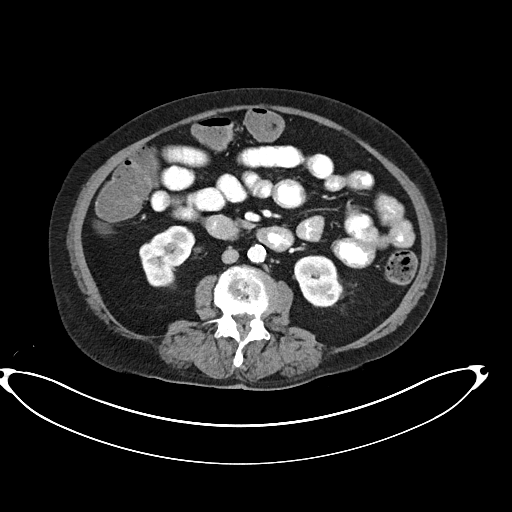
[im 69/96  soft-tissue]
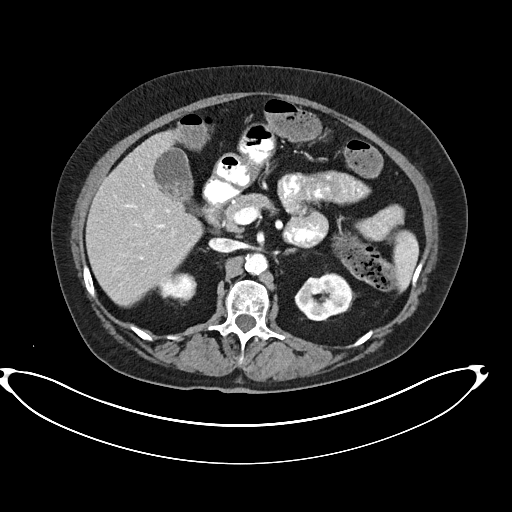
[im 69/96  bone]
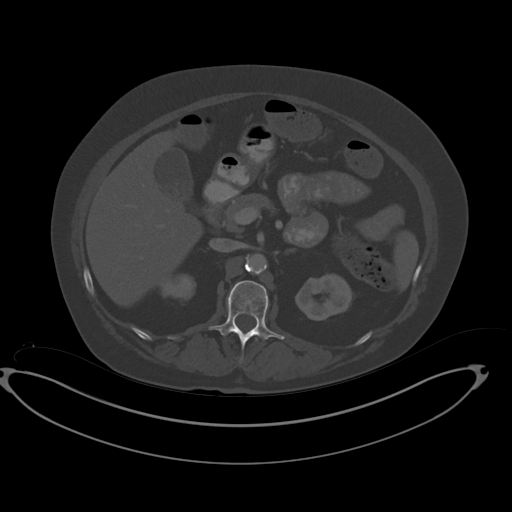
[im 74/96  soft-tissue]
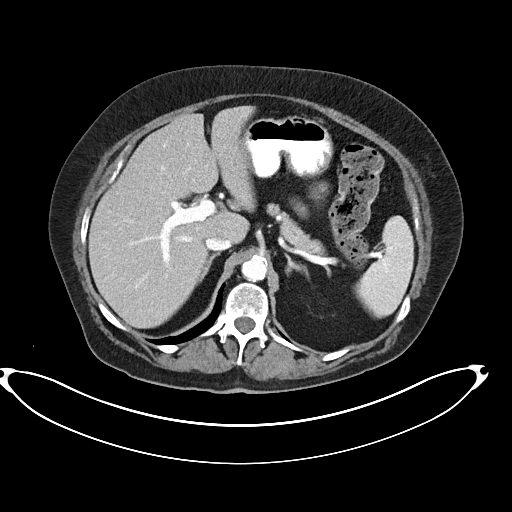
[im 80/96  soft-tissue]
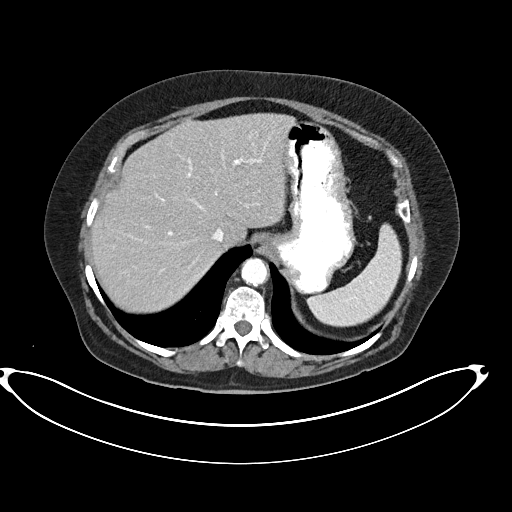
[im 90/96  soft-tissue]
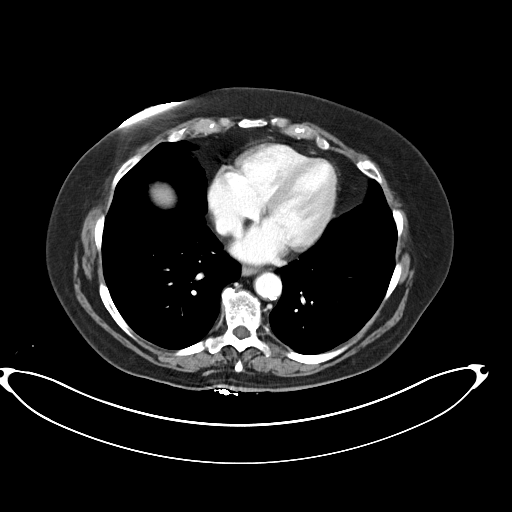

[Series 4: coronals abd pelvis 2.00 cor · coronal · 0.79mm/px · 3 of 152 slices shown]
[im 51/152  soft-tissue]
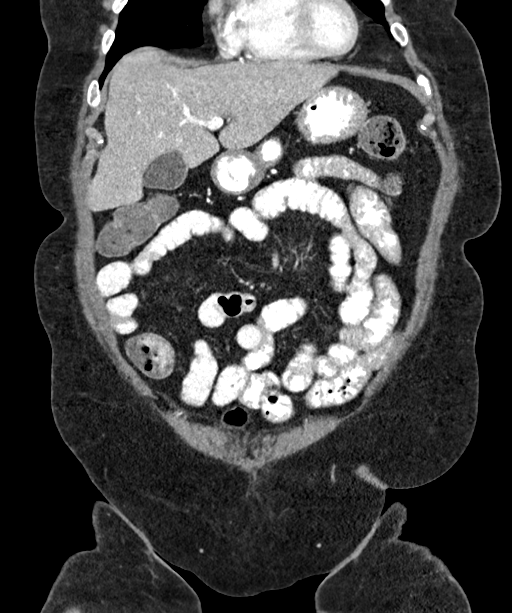
[im 68/152  soft-tissue]
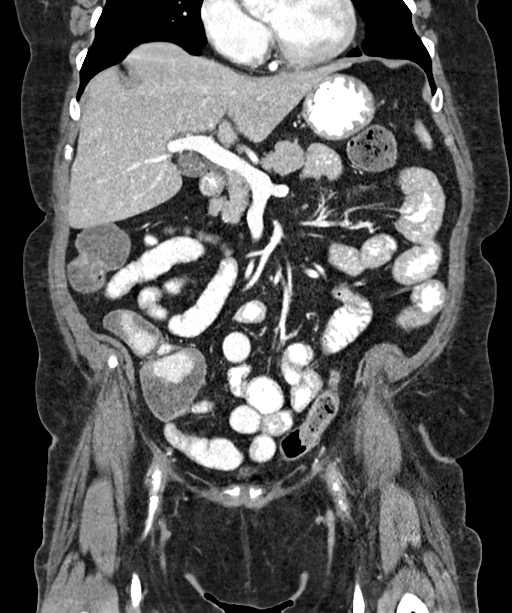
[im 84/152  soft-tissue]
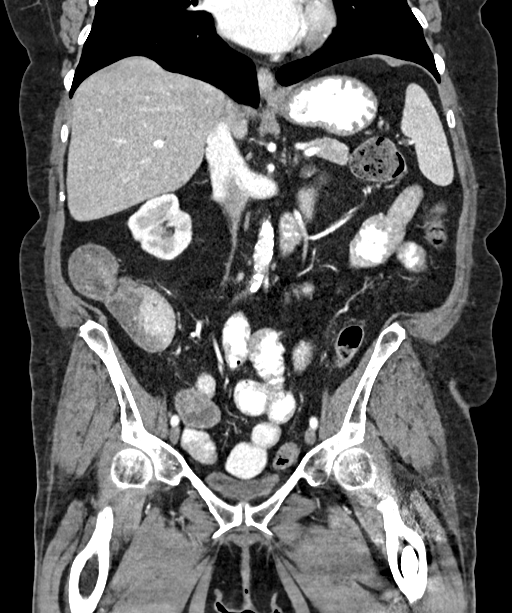

[15 of 46 positions shown; findings below may reference images not displayed]

FINDINGS: Lower chest: Punctate calcified pulmonary nodules within the right
lower lobe. Coronary artery calcifications. No acute abnormality.

Hepatobiliary: The hepatic parenchyma is diffusely hypodense
compared to the splenic parenchyma consistent with fatty
infiltration. No focal liver abnormality. High density material
within the gallbladder lumen might represent skull bladder sludge
versus cholelithiasis. No gallbladder wall thickening or
pericholecystic fluid. No biliary dilatation.

Pancreas: No focal lesion. Normal pancreatic contour. No surrounding
inflammatory changes. No main pancreatic ductal dilatation.

Spleen: Normal in size without focal abnormality.

Adrenals/Urinary Tract:

No adrenal nodule bilaterally.

Bilateral kidneys enhance symmetrically. No hydronephrosis. No
hydroureter.

Couple foci of gas within the urinary bladder lumen. Otherwise the
urinary bladder is unremarkable.

On delayed imaging, there is no urothelial wall thickening and there
are no filling defects in the opacified portions of the bilateral
collecting systems or ureters.

Stomach/Bowel: PO contrast reaches the cecum. Stomach is within
normal limits. No evidence of bowel wall thickening or dilatation.
Few scattered colonic diverticula. The appendix is not definitely
identified.

Vascular/Lymphatic: Partially visualized bilateral femoral artery
stents. No abdominal aorta or iliac aneurysm. At least moderate
calcified and noncalcified atherosclerotic plaque of the aorta and
its branches. No abdominal, pelvic, or inguinal lymphadenopathy.

Reproductive: Status post hysterectomy. No adnexal masses.

Other: No intraperitoneal free fluid. No intraperitoneal free gas.
No organized fluid collection.

Musculoskeletal:

Bilateral fat containing inguinal hernias.

No suspicious lytic or blastic osseous lesions. No acute displaced
fracture. Multilevel degenerative changes of the spine.
Similar-appearing compression fractures of the L3-L4 levels with
kyphoplasty at the L4 level. Redemonstration of bilateral
sacroplasties. Partially visualized intramedullary nail fixation of
the left proximal femur.
IMPRESSION: 1. Couple foci of gas within the urinary bladder lumen. Correlate
with recent instrumentation versus infection.
2. Scattered colonic diverticulosis with no acute diverticulitis.
3. Question gallbladder sludge versus cholelithiasis. Consider right
upper quadrant ultrasound for further evaluation.
4. Bilateral fat containing inguinal hernias.

These results will be called to the ordering clinician or
representative by the Radiologist Assistant, and communication
documented in the PACS or [REDACTED].

## 2020-04-01 MED ORDER — IOHEXOL 300 MG/ML  SOLN
100.0000 mL | Freq: Once | INTRAMUSCULAR | Status: AC | PRN
  Administered 2020-04-01: 100 mL via INTRAVENOUS

## 2020-04-02 ENCOUNTER — Other Ambulatory Visit (HOSPITAL_COMMUNITY): Payer: Self-pay | Admitting: Internal Medicine

## 2020-04-02 ENCOUNTER — Other Ambulatory Visit: Payer: Self-pay | Admitting: Internal Medicine

## 2020-04-02 DIAGNOSIS — R1011 Right upper quadrant pain: Secondary | ICD-10-CM

## 2020-04-02 NOTE — Progress Notes (Signed)
[facility] - Clinic Note  04/03/2020     CHIEF COMPLAINT Patient presents for Retina Follow Up   HISTORY OF PRESENT ILLNESS: Cheryl Leonard is a 75 y.o. female who presents to the clinic today for:   HPI    Retina Follow Up    Patient presents with  CRVO/BRVO.  In right eye.  This started years ago.  Severity is moderate.  Duration of 5 weeks.  Since onset it is stable.  I, the attending physician,  performed the HPI with the patient and updated documentation appropriately.          Comments    75 y/o female pt here for 5 wk f/u for BRVO w/CME OD.  No change in [place] OU.  Denies pain, FOL, floaters.  No gtts.  BS 114 this a.m.  A1C in the 5s.       Last edited by [name], MD on 04/03/2020  7:55 PM. (History)    Patient   Referring physician: Danella Penton, MD [phone] [facility] [address] [facility] [place],  [place] [address]  HISTORICAL INFORMATION:   Selected notes from the MEDICAL RECORD NUMBER Referred by Dr. Senaida Ores for concern of DME OD Lab Results  Component Value Date   HGBA1C 5.9 (H) 10/29/2015       CURRENT MEDICATIONS: No current outpatient medications on file. (Ophthalmic Drugs)   No current facility-administered medications for this visit. (Ophthalmic Drugs)   Current Outpatient Medications (Other)  Medication Sig  . ALPRAZolam (XANAX) 0.25 MG tablet Take 0.25 mg by mouth daily as needed for anxiety or sleep.   . Ascorbic Acid (VITAMIN C) 1000 MG tablet Take 1,000 mg by mouth daily.  [name] aspirin EC 81 MG tablet Take 81 mg by mouth daily.   . cefUROXime (CEFTIN) 250 MG tablet Take by mouth.  . cholecalciferol (VITAMIN D) 1000 units tablet Take 1,000 Units by mouth 2 (two) times daily.  [name] CORAL CALCIUM PO Take 1 tablet by mouth 2 (two) times daily.   [name] denosumab (PROLIA) 60 MG/ML SOLN injection Inject 60 mg into the skin every 6 (six) months.   . DULoxetine (CYMBALTA) 30 MG capsule Take 30 mg by mouth daily.   . famotidine (PEPCID) 40 MG tablet Take 40 mg by mouth 2 (two) times daily.   . furosemide (LASIX) 20 MG tablet Take 20 mg by mouth daily as needed for fluid or edema.   . gabapentin (NEURONTIN) 100 MG capsule Take 1 capsule by mouth daily.  . Insulin Pen Needle 31G X 5 MM MISC once daily.  [name] levothyroxine (SYNTHROID, LEVOTHROID) 100 MCG tablet Take 100 mcg by mouth daily before breakfast.   . lidocaine-prilocaine (EMLA) cream Apply 1 application topically as needed (apply prior to port a cath access).  . Magnesium 500 MG TABS Take 500 mg by mouth every morning.   . metFORMIN (GLUCOPHAGE) 1000 MG tablet Take 1 tablet by mouth 2 (two) times daily with a meal.  . methylPREDNISolone (MEDROL DOSEPAK) 4 MG TBPK tablet Take by mouth.  . mirabegron ER (MYRBETRIQ) 25 MG TB24 tablet Take 1 tablet (25 mg total) by mouth daily.  . Multiple Vitamin (MULTIVITAMIN WITH MINERALS) TABS tablet Take 1 tablet by mouth daily.  . mupirocin cream (BACTROBAN) 2 % Apply topically 2 (two) times daily.  . nitrofurantoin (MACRODANTIN) 100 MG capsule Take by mouth.  . olmesartan (BENICAR) 20 MG tablet Take 20 mg by mouth daily.  [name] OZEMPIC,  0.25 OR 0.5 MG/DOSE, 2 MG/1.5ML SOPN Inject into the skin.  . pantoprazole (PROTONIX) 40 MG tablet Take 40 mg by mouth every morning.   . rivaroxaban (XARELTO) 20 MG TABS tablet Take 1 tablet (20 mg total) by mouth daily with supper.  . rosuvastatin (CRESTOR) 20 MG tablet Take 20 mg by mouth every morning.   [name] tiZANidine (ZANAFLEX) 4 MG tablet Take 4 mg by mouth 3 (three) times daily.  . traMADol (ULTRAM) 50 MG tablet Take 50 mg by mouth every 6 (six) hours as needed.  . TRESIBA FLEXTOUCH 200 UNIT/ML SOPN Inject 30 Units as directed at bedtime.   [name] VASCEPA 1 g CAPS Take 1 capsule by mouth 2 (two) times daily.  . vitamin B-12 (CYANOCOBALAMIN) 1000 MCG tablet Take 1,000 mcg by mouth daily.  . vitamin E 400 UNIT capsule Take 400 Units by mouth daily.  [name] zolpidem (AMBIEN) 5 MG tablet  Take 1 tablet (5 mg total) by mouth at bedtime. (Patient taking differently: Take 5 mg by mouth at bedtime as needed.)  . amLODipine (NORVASC) 5 MG tablet Take by mouth.  . metoprolol succinate (TOPROL-XL) 50 MG 24 hr tablet Take by mouth.  . mirtazapine (REMERON) 15 MG tablet Take by mouth.   No current facility-administered medications for this visit. (Other)      REVIEW OF SYSTEMS: ROS    Positive for: Neurological, Genitourinary, Musculoskeletal, Endocrine, Eyes   Negative for: Constitutional, Gastrointestinal, Skin, HENT, Cardiovascular, Respiratory, Psychiatric, Allergic/Imm, Heme/Lymph   Last edited by Celine Mans, COA on 04/03/2020  2:21 PM. (History)       ALLERGIES Allergies  Allergen Reactions  . Ace Inhibitors Other (See Comments)    PAST MEDICAL HISTORY Past Medical History:  Diagnosis Date  . Anemia   . Anxiety   . Arthritis    Gout  . Cataracts, both eyes   . Diabetic retinopathy (HCC)    NPDR OU  . Diabetic retinopathy (HCC)   . GERD (gastroesophageal reflux disease)   . Gout   . Headache    h/o migraines  . History of fracture of patella    right knee  . History of positive PPD    Patient always shows positive  . Hyperlipidemia   . Hypertension   . Hypertensive retinopathy    OU  . Hypothyroidism   . Lichen sclerosus 12/30/2013   of vulva  . Metatarsal fracture   . Neuropathy   . Peripheral vascular disease (HCC)   . Polyneuropathy    numbness and tingling in feet and toes  . Renal insufficiency    Stage 3  . Sleep apnea    does not use cpap-lost weight   . Type 2 diabetes mellitus, uncontrolled (HCC)    Past Surgical History:  Procedure Laterality Date  . ABDOMINAL HYSTERECTOMY    . APPENDECTOMY    . BREAST REDUCTION SURGERY  2001  . CATARACT EXTRACTION    . CESAREAN SECTION  1976  . COLONOSCOPY  03/05/2013   Nml - due for repeat 03/06/2018  . COLONOSCOPY WITH PROPOFOL N/A 03/18/2019   Procedure: COLONOSCOPY WITH PROPOFOL;   Surgeon: Toledo, [name], MD;  Location: [facility];  Service: Gastroenterology;  Laterality: N/A;  . DIAGNOSTIC LAPAROSCOPY    . DILATION AND CURETTAGE OF UTERUS  1989  . ENDARTERECTOMY FEMORAL Bilateral 03/09/2018   Procedure: ENDARTERECTOMY FEMORAL;  Surgeon: Annice Needy, MD;  Location: [facility];  Service: Vascular;  Laterality: Bilateral;  . ENDARTERECTOMY POPLITEAL Left  03/09/2018   Procedure: ENDARTERECTOMY POPLITEAL AND SFA;  Surgeon: Annice Needy, MD;  Location: [facility];  Service: Vascular;  Laterality: Left;  . ESOPHAGOGASTRODUODENOSCOPY  03/05/2013  . ESOPHAGOGASTRODUODENOSCOPY (EGD) WITH PROPOFOL N/A 03/18/2019   Procedure: ESOPHAGOGASTRODUODENOSCOPY (EGD) WITH PROPOFOL;  Surgeon: Toledo, [name], MD;  Location: [facility];  Service: Gastroenterology;  Laterality: N/A;  . EYE SURGERY    . Eyelid Surgery  2012  . INTRAMEDULLARY (IM) NAIL INTERTROCHANTERIC Left 10/30/2015   Procedure: INTRAMEDULLARY (IM) NAIL INTERTROCHANTRIC ;  Surgeon: Kennedy Bucker, MD;  Location: [facility];  Service: Orthopedics;  Laterality: Left;  . KYPHOPLASTY N/A 10/25/2018   Procedure: L4 KYPHOPLASTY;  Surgeon: Kennedy Bucker, MD;  Location: [facility];  Service: Orthopedics;  Laterality: N/A;  . LAPAROSCOPIC HYSTERECTOMY  2000   total  . LOWER EXTREMITY ANGIOGRAPHY Left 03/08/2017   Procedure: LOWER EXTREMITY ANGIOGRAPHY;  Surgeon: Annice Needy, MD;  Location: [facility];  Service: Cardiovascular;  Laterality: Left;  . LOWER EXTREMITY ANGIOGRAPHY Left 10/30/2017   Procedure: LOWER EXTREMITY ANGIOGRAPHY;  Surgeon: Annice Needy, MD;  Location: [facility];  Service: Cardiovascular;  Laterality: Left;  . LOWER EXTREMITY ANGIOGRAPHY Right 03/08/2018   Procedure: LOWER EXTREMITY ANGIOGRAPHY;  Surgeon: Annice Needy, MD;  Location: [facility];  Service: Cardiovascular;  Laterality: Right;  . LOWER EXTREMITY ANGIOGRAPHY Left 10/01/2018   Procedure: LOWER EXTREMITY ANGIOGRAPHY;  Surgeon:  Annice Needy, MD;  Location: [facility];  Service: Cardiovascular;  Laterality: Left;  . LOWER EXTREMITY ANGIOGRAPHY Right 10/08/2018   Procedure: LOWER EXTREMITY ANGIOGRAPHY;  Surgeon: Annice Needy, MD;  Location: [facility];  Service: Cardiovascular;  Laterality: Right;  . PERIPHERAL VASCULAR INTERVENTION  03/08/2018   Procedure: PERIPHERAL VASCULAR INTERVENTION;  Surgeon: Annice Needy, MD;  Location: [facility];  Service: Cardiovascular;;  . PORTA CATH INSERTION N/A 02/17/2020   Procedure: PORTA CATH INSERTION;  Surgeon: Annice Needy, MD;  Location: [facility];  Service: Cardiovascular;  Laterality: N/A;  . REDUCTION MAMMAPLASTY  1997  . SACROPLASTY N/A 10/25/2018   Procedure: S1 SACROPLASTY;  Surgeon: Kennedy Bucker, MD;  Location: [facility];  Service: Orthopedics;  Laterality: N/A;    FAMILY HISTORY Family History  Problem Relation Age of Onset  . Coronary artery disease Father   . Heart attack Father   . Coronary artery disease Mother   . Heart attack Mother   . Ovarian cancer Sister 78       sister had hormonal therapy for IVF txs-which increased risk factor for ovarian cancer  . Breast cancer Neg Hx     SOCIAL HISTORY Social History   Tobacco Use  . Smoking status: Former Smoker    Packs/day: 1.00    Years: 20.00    Pack years: 20.00    Types: Cigarettes    Quit date: 03/07/1996    Years since quitting: 24.0  . Smokeless tobacco: Never Used  . Tobacco comment: started smoking at age 32  Vaping Use  . Vaping Use: Never used  Substance Use Topics  . Alcohol use: No    Alcohol/week: 0.0 standard drinks  . Drug use: No         OPHTHALMIC EXAM:  Base Eye Exam    Visual Acuity (Snellen - Linear)      Right Left   Dist [place] 20/40 20/20   Dist ph [place] NI        Tonometry (Tonopen,  2:24 PM)      Right Left   Pressure 14 16       Pupils      Dark Light Shape React APD   Right 3 2 Round Brisk None   Left 3 2 Round Brisk None        Visual Fields (Counting fingers)      Left Right    Full Full       Extraocular Movement      Right Left    Full, Ortho Full, Ortho       Neuro/Psych    Oriented x3: Yes   Mood/Affect: Normal       Dilation    Both eyes: 1.0% Mydriacyl, 2.5% Phenylephrine @ 2:24 PM        Slit Lamp and Fundus Exam    External Exam      Right Left   External Normal Normal       Slit Lamp Exam      Right Left   Lids/Lashes dermatochalasis dermatochalasis   Conjunctiva/Sclera White and quiet White and quiet   Cornea arcus; well healed cataract wound; 2-3+ diffuse Punctate epithelial erosions, decreased TBUT, mild Anterior basement membrane dystrophy superiorly arcus; well healed cataract wound, 2-3+ diffuse Punctate epithelial erosions, irregualr epi surface, decreased TBUT   Anterior Chamber Deep and quiet Deep and quiet   Iris Round and dilated Round and dilated   Lens PCIOL; open PC PCIOL; open PC   Vitreous syneresis, Posterior vitreous detachment, vitreous condensations inferiorly syneresis, Posterior vitreous detachment       Fundus Exam      Right Left   Disc Superior hyperemia and mild edema - improved, mild Pallor Pink and Sharp   C/D Ratio 0.5 0.5   Macula Flat, Blunted foveal reflex, cystic changes slightly increased, +Epiretinal membrane, no heme flat; good foveal reflex, no heme or edema, small pigment clump IT to fovea   Vessels attenuated, Tortuous Vascular attenuation   Periphery attached; MAs/DBH temporal macula Attached, no heme          IMAGING AND PROCEDURES  Imaging and Procedures for 04/25/17  OCT, Retina - OU - Both Eyes       Right Eye Quality was good. Central Foveal Thickness: 365. Progression has worsened. Findings include abnormal foveal contour, epiretinal membrane, no SRF, intraretinal fluid (Mild Interval increase in IRF and foveal contour).   Left Eye Quality was good. Central Foveal Thickness: 281. Progression has been stable. Findings  include normal foveal contour, no IRF, no SRF (Trace ERM).   Notes *Images captured and stored on drive  Diagnosis / Impression:  OD: BRVO -- Mild Interval increase in IRF and foveal contour OS: NFP; no IRF/SRF--stable, trace ERM  Clinical management:  See below  Abbreviations: NFP - Normal foveal profile. CME - cystoid macular edema. PED - pigment epithelial detachment. IRF - intraretinal fluid. SRF - subretinal fluid. EZ - ellipsoid zone. ERM - epiretinal membrane. ORA - outer retinal atrophy. ORT - outer retinal tubulation. SRHM - subretinal hyper-reflective material        Intravitreal Injection, Pharmacologic Agent - OD - Right Eye       Time Out 04/03/2020. 2:48 PM. Confirmed correct patient, procedure, site, and patient consented.   Anesthesia Topical anesthesia was used. Anesthetic medications included Lidocaine 2%, Proparacaine 0.5%.   Procedure Preparation included 5% betadine to ocular surface, eyelid speculum. A (32g) needle was used.   Injection:  2 mg aflibercept (EYLEA) SOLN   NDC:  11914-782-95, Lot: [id number], Expiration date: 10/02/2020   Route: Intravitreal, Site: Right Eye, Waste: 0.05 mL  Post-op Post injection exam found visual acuity of at least counting fingers. The patient tolerated the procedure well. There were no complications. The patient received written and verbal post procedure care education. Post injection medications were not given.                 ASSESSMENT/PLAN:    ICD-10-CM   1. Branch retinal vein occlusion of right eye with macular edema  H34.8310 Intravitreal Injection, Pharmacologic Agent - OD - Right Eye    aflibercept (EYLEA) SOLN 2 mg  2. Retinal edema  H35.81 OCT, Retina - OU - Both Eyes  3. Both eyes affected by mild nonproliferative diabetic retinopathy with macular edema, associated with type 2 diabetes mellitus (HCC)  I69.6295   4. Essential hypertension  I10   5. Hypertensive retinopathy of both eyes  H35.033   6.  Epiretinal membrane (ERM) of right eye  H35.371   7. Pseudophakia of both eyes  Z96.1     1,2. BRVO w/ CME OD  - by history, pt states symptoms first noticed 2 wks prior to presentation, but reports changes may have occurred prior  - initial exam with differential tortuosity of vessels (OD > OS)  - FA (02.10.20) shows mild late staining / leakage in macula, staining / leakage of disc -- improving CME  - differential includes DM2 (DME), hypertensive retinopathy, inflammatory etiology / uveitis  - S/P IVA OD #1 (02.08.19), #2 (03.11.19), #3 (04.09.19), #4 (05.20.19), #5 (02.10.20)  - gave IVA OD on 2.10.20 due to pending Eylea4U for 2020 -- resulted in increased IRF/CME  - review of OCTs show persistent IRF and cystic changes --  resistance to IVA   - June 2019 -- switched therapies: S/P IVE OD #1 (06.24.19), #2 (07.24.19), #3 (09.04.19), #4 (10.30.19),#5 (12.30.19), #6 (03.23.20), #7 (05.05.20), #8 (07.16.20), #9 (07.17.20), #10 (08.28.20), #11 (10.13.20), # 12 (11.17.20), #13 (2.8.21), #14 (03.09.21), #15 (04.13.21), #16 (05.11.21), #17 (06.17.21), #18 (07.23.21), #19 (08.30.21), #20 (10.04.21), #21 (11.08.21), #22 (12.08.21), #23 (01.31.22), #24 (02.28.22)  - OCT today shows mild interval increase in IRF OD at 4.5 wks  - BCVA 20/40 -- stable  - Eylea4U benefits investigation completed and pt approved for IVE for 2022  - recommend IVE OD #25 today, 04.01.22 w/ f/u in 4 wks  - RBA of procedure discussed, questions answered  - informed consent obtained  - see procedure note  - Eylea informed consent form obtained and scanned on 11.19.2020  - f/u 4 weeks  -- DFE/OCT/possible injection  3. Mild nonproliferative diabetic retinopathy, both eyes  - could be contributing to CME OD  - OS with minimal diabetic retinopathy  - continue to monitor  4,5. Hypertensive retinopathy OU - stable  - as above, may have contributing to CME OD  - discussed importance of tight BP control  - monitor  6.  Epiretinal membrane, right eye   - stable nasal ERM  - no indication for surgery at this time  7. Pseudophakia OU  - s/p CE/IOL OU by cataract surgeon in [facility]  - doing well  - monitor    Ophthalmic Meds Ordered this visit:  Meds ordered this encounter  Medications  . aflibercept (EYLEA) SOLN 2 mg       Return in about 4 weeks (around 05/01/2020) for f/u BRVO OD, DFE, OCT.  This document serves as a record of services personally performed by Arlys [name]  Wynona NeatG. Lorae Roig, MD, PhD. It was created on their behalf by Glee ArvinAmanda J. Manson PasseyBrown, OA an ophthalmic technician. The creation of this record is the provider's dictation and/or activities during the visit.    Electronically signed by: Glee ArvinAmanda J. Bell GardensBrown, [place] 03.31.2022 7:59 PM  Karie ChimeraBrian G. Khayla Koppenhaver, M.D., Ph.D. Diseases & Surgery of the Retina and Vitreous [facility]  I have reviewed the above documentation for accuracy and completeness, and I agree with the above. Karie ChimeraBrian G. Nahla Lukin, M.D., Ph.D. 04/03/20 7:59 PM   Abbreviations: M myopia (nearsighted); A astigmatism; H hyperopia (farsighted); P presbyopia; Mrx spectacle prescription;  CTL contact lenses; OD right eye; OS left eye; OU both eyes  XT exotropia; ET esotropia; PEK punctate epithelial keratitis; PEE punctate epithelial erosions; DES dry eye syndrome; MGD meibomian gland dysfunction; ATs artificial tears; PFAT's preservative free artificial tears; NSC nuclear sclerotic cataract; PSC posterior subcapsular cataract; ERM epi-retinal membrane; PVD posterior vitreous detachment; RD retinal detachment; DM diabetes mellitus; DR diabetic retinopathy; NPDR non-proliferative diabetic retinopathy; PDR proliferative diabetic retinopathy; CSME clinically significant macular edema; DME diabetic macular edema; dbh dot blot hemorrhages; CWS cotton wool spot; POAG primary open angle glaucoma; C/D cup-to-disc ratio; HVF humphrey visual field; GVF goldmann visual field; OCT optical coherence tomography;  IOP intraocular pressure; BRVO Branch retinal vein occlusion; CRVO central retinal vein occlusion; CRAO central retinal artery occlusion; BRAO branch retinal artery occlusion; RT retinal tear; SB scleral buckle; PPV pars plana vitrectomy; VH Vitreous hemorrhage; PRP panretinal laser photocoagulation; IVK intravitreal kenalog; VMT vitreomacular traction; MH Macular hole;  NVD neovascularization of the disc; NVE neovascularization elsewhere; AREDS age related eye disease study; ARMD age related macular degeneration; POAG primary open angle glaucoma; EBMD epithelial/anterior basement membrane dystrophy; ACIOL anterior chamber intraocular lens; IOL intraocular lens; PCIOL posterior chamber intraocular lens; Phaco/IOL phacoemulsification with intraocular lens placement; PRK photorefractive keratectomy; LASIK laser assisted in situ keratomileusis; HTN hypertension; DM diabetes mellitus; COPD chronic obstructive pulmonary disease

## 2020-04-03 ENCOUNTER — Other Ambulatory Visit: Payer: Self-pay

## 2020-04-03 ENCOUNTER — Encounter (INDEPENDENT_AMBULATORY_CARE_PROVIDER_SITE_OTHER): Payer: Self-pay | Admitting: Ophthalmology

## 2020-04-03 ENCOUNTER — Ambulatory Visit (INDEPENDENT_AMBULATORY_CARE_PROVIDER_SITE_OTHER): Payer: Medicare Other | Admitting: Ophthalmology

## 2020-04-03 DIAGNOSIS — H4710 Unspecified papilledema: Secondary | ICD-10-CM

## 2020-04-03 DIAGNOSIS — H35033 Hypertensive retinopathy, bilateral: Secondary | ICD-10-CM

## 2020-04-03 DIAGNOSIS — I10 Essential (primary) hypertension: Secondary | ICD-10-CM | POA: Diagnosis not present

## 2020-04-03 DIAGNOSIS — Z961 Presence of intraocular lens: Secondary | ICD-10-CM

## 2020-04-03 DIAGNOSIS — H35371 Puckering of macula, right eye: Secondary | ICD-10-CM

## 2020-04-03 DIAGNOSIS — H3581 Retinal edema: Secondary | ICD-10-CM

## 2020-04-03 DIAGNOSIS — H34831 Tributary (branch) retinal vein occlusion, right eye, with macular edema: Secondary | ICD-10-CM | POA: Diagnosis not present

## 2020-04-03 DIAGNOSIS — H43391 Other vitreous opacities, right eye: Secondary | ICD-10-CM

## 2020-04-03 DIAGNOSIS — E113213 Type 2 diabetes mellitus with mild nonproliferative diabetic retinopathy with macular edema, bilateral: Secondary | ICD-10-CM

## 2020-04-03 MED ORDER — AFLIBERCEPT 2MG/0.05ML IZ SOLN FOR KALEIDOSCOPE
2.0000 mg | INTRAVITREAL | Status: AC | PRN
  Administered 2020-04-03: 2 mg via INTRAVITREAL

## 2020-04-08 ENCOUNTER — Other Ambulatory Visit: Payer: Self-pay

## 2020-04-08 ENCOUNTER — Inpatient Hospital Stay (HOSPITAL_BASED_OUTPATIENT_CLINIC_OR_DEPARTMENT_OTHER): Payer: Medicare Other | Admitting: Internal Medicine

## 2020-04-08 ENCOUNTER — Inpatient Hospital Stay: Payer: Medicare Other

## 2020-04-08 ENCOUNTER — Other Ambulatory Visit: Payer: Self-pay | Admitting: *Deleted

## 2020-04-08 ENCOUNTER — Inpatient Hospital Stay: Payer: Medicare Other | Attending: Internal Medicine

## 2020-04-08 VITALS — BP 130/59 | HR 67 | Resp 20

## 2020-04-08 DIAGNOSIS — D631 Anemia in chronic kidney disease: Secondary | ICD-10-CM | POA: Diagnosis present

## 2020-04-08 DIAGNOSIS — D649 Anemia, unspecified: Secondary | ICD-10-CM

## 2020-04-08 DIAGNOSIS — N183 Chronic kidney disease, stage 3 unspecified: Secondary | ICD-10-CM

## 2020-04-08 DIAGNOSIS — N189 Chronic kidney disease, unspecified: Secondary | ICD-10-CM | POA: Insufficient documentation

## 2020-04-08 LAB — BASIC METABOLIC PANEL
Anion gap: 11 (ref 5–15)
BUN: 44 mg/dL — ABNORMAL HIGH (ref 8–23)
CO2: 21 mmol/L — ABNORMAL LOW (ref 22–32)
Calcium: 9.9 mg/dL (ref 8.9–10.3)
Chloride: 105 mmol/L (ref 98–111)
Creatinine, Ser: 1.51 mg/dL — ABNORMAL HIGH (ref 0.44–1.00)
GFR, Estimated: 36 mL/min — ABNORMAL LOW (ref >60)
Glucose, Bld: 210 mg/dL — ABNORMAL HIGH (ref 70–99)
Potassium: 4.8 mmol/L (ref 3.5–5.1)
Sodium: 137 mmol/L (ref 135–145)

## 2020-04-08 LAB — CBC WITH DIFFERENTIAL/PLATELET
Abs Immature Granulocytes: 0.02 10*3/uL (ref 0.00–0.07)
Basophils Absolute: 0 10*3/uL (ref 0.0–0.1)
Basophils Relative: 1 %
Eosinophils Absolute: 0.1 10*3/uL (ref 0.0–0.5)
Eosinophils Relative: 2 %
HCT: 27.6 % — ABNORMAL LOW (ref 36.0–46.0)
Hemoglobin: 8.6 g/dL — ABNORMAL LOW (ref 12.0–15.0)
Immature Granulocytes: 0 %
Lymphocytes Relative: 20 %
Lymphs Abs: 1.2 10*3/uL (ref 0.7–4.0)
MCH: 27.1 pg (ref 26.0–34.0)
MCHC: 31.2 g/dL (ref 30.0–36.0)
MCV: 87.1 fL (ref 80.0–100.0)
Monocytes Absolute: 0.7 10*3/uL (ref 0.1–1.0)
Monocytes Relative: 11 %
Neutro Abs: 4.2 10*3/uL (ref 1.7–7.7)
Neutrophils Relative %: 66 %
Platelets: 294 10*3/uL (ref 150–400)
RBC: 3.17 MIL/uL — ABNORMAL LOW (ref 3.87–5.11)
RDW: 15.2 % (ref 11.5–15.5)
WBC: 6.3 10*3/uL (ref 4.0–10.5)
nRBC: 0 % (ref 0.0–0.2)

## 2020-04-08 MED ORDER — HEPARIN SOD (PORK) LOCK FLUSH 100 UNIT/ML IV SOLN
500.0000 [IU] | Freq: Once | INTRAVENOUS | Status: AC | PRN
  Administered 2020-04-08: 500 [IU]
  Filled 2020-04-08: qty 5

## 2020-04-08 MED ORDER — LIDOCAINE-PRILOCAINE 2.5-2.5 % EX CREA: 1.0000 "application " | TOPICAL_CREAM | CUTANEOUS | 3 refills | Status: AC | PRN

## 2020-04-08 MED ORDER — IRON SUCROSE 20 MG/ML IV SOLN
200.0000 mg | Freq: Once | INTRAVENOUS | Status: AC
  Administered 2020-04-08: 200 mg via INTRAVENOUS
  Filled 2020-04-08: qty 10

## 2020-04-08 MED ORDER — SODIUM CHLORIDE 0.9% FLUSH
10.0000 mL | Freq: Once | INTRAVENOUS | Status: AC | PRN
  Administered 2020-04-08: 10 mL
  Filled 2020-04-08: qty 10

## 2020-04-08 MED ORDER — HEPARIN SOD (PORK) LOCK FLUSH 100 UNIT/ML IV SOLN
INTRAVENOUS | Status: AC
  Filled 2020-04-08: qty 5

## 2020-04-08 MED ORDER — SODIUM CHLORIDE 0.9 % IV SOLN
Freq: Once | INTRAVENOUS | Status: AC
  Filled 2020-04-08: qty 250

## 2020-04-08 NOTE — Assessment & Plan Note (Addendum)
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  Improved s/p IV iron infusions however today- Hb 8.1 . Proceed with Venofer today.    # DEC 2021- Iron saturation 12%; ferritin 14-anemia secondary to CKD.  Proceed with iron infusions as planned.  #Diabetes -BG-210 [Dr.Solum]-overall stable.  # Etiology-CKD-III; GFR 36 overall -STABLE.  [Dr.Kolluru]; recommend continued hydration.  #Poor IV access/Mediport placement-stable  # DISPOSITION: # Venofer today;  # Venofer weekly x-3 # follow up 3 months- MD; port; labs- cbc/bmp;iron studies/ferritin-  possible venofer- Dr.B

## 2020-04-08 NOTE — Progress Notes (Signed)
[facility] NOTE  Patient Care Team: Rusty Aus, MD as PCP - General (Internal Medicine) Josefine Class, MD as Referring Physician (Gastroenterology) Cammie Sickle, MD as Consulting Physician (Hematology and Oncology)  CHIEF COMPLAINTS/PURPOSE OF CONSULTATION: Anemia  HEMATOLOGY HISTORY  # ANEMIA- Jan 2021- 8.8/ferritin 11 [PCP]; N-WBC/platelets? IDA vs other- EGD-2015/colonoscopy-? 2015; 2020- [Dr.Skulskie] ; capsule-2016- ? 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  female anemia iron deficiency-question CKD-III s here for follow-up.   Patient needs a Mediport placed for IV access.  Patient did not pick her EMLA cream from the pharmacy.  Complains of mild to moderate fatigue.  No blood in stools or black or stools.  Review of Systems  Constitutional: Positive for malaise/fatigue. Negative for chills, diaphoresis and fever.  HENT: Negative for nosebleeds and sore throat.   Eyes: Negative for double vision.  Respiratory: Positive for shortness of breath. Negative for cough, hemoptysis, sputum production and wheezing.   Cardiovascular: Negative for chest pain, palpitations, orthopnea and leg swelling.  Gastrointestinal: Negative for abdominal pain, blood in stool, constipation, diarrhea, heartburn, melena, nausea and vomiting.  Genitourinary: Negative for dysuria, frequency and urgency.  Musculoskeletal: Positive for joint pain.  Skin: Negative.  Negative for itching and rash.  Neurological: Negative for dizziness, tingling, focal weakness, weakness and headaches.  Endo/Heme/Allergies: Does not bruise/bleed easily.  Psychiatric/Behavioral: Negative for depression. The patient is not nervous/anxious and does not have insomnia.     MEDICAL HISTORY:  Past Medical History:   Diagnosis Date  . Anemia   . Anxiety   . Arthritis    Gout  . Cataracts, both eyes   . Diabetic retinopathy ([place])    NPDR OU  . Diabetic retinopathy ([place])   . GERD (gastroesophageal reflux disease)   . Gout   . Headache    h/o migraines  . History of fracture of patella    right knee  . History of positive PPD    Patient always shows positive  . Hyperlipidemia   . Hypertension   . Hypertensive retinopathy    OU  . Hypothyroidism   . Lichen sclerosus 93/26/7124   of vulva  . Metatarsal fracture   . Neuropathy   . Peripheral vascular disease ([place])   . Polyneuropathy    numbness and tingling in feet and toes  . Renal insufficiency    Stage 3  . Sleep apnea    does not use cpap-lost weight   . Type 2 diabetes mellitus, uncontrolled ([place])     SURGICAL HISTORY: Past Surgical History:  Procedure Laterality Date  . ABDOMINAL HYSTERECTOMY    . APPENDECTOMY    . BREAST REDUCTION SURGERY  2001  . CATARACT EXTRACTION    . CESAREAN SECTION  1976  . COLONOSCOPY  03/05/2013   Nml - due for repeat 03/06/2018  . COLONOSCOPY WITH PROPOFOL N/A 03/18/2019   Procedure: COLONOSCOPY WITH PROPOFOL;  Surgeon: Toledo, Benay Pike, MD;  Location: [facility];  Service: Gastroenterology;  Laterality: N/A;  . DIAGNOSTIC LAPAROSCOPY    . DILATION AND CURETTAGE OF UTERUS  1989  . ENDARTERECTOMY FEMORAL Bilateral 03/09/2018   Procedure: ENDARTERECTOMY FEMORAL;  Surgeon: Algernon Huxley, MD;  Location: [facility];  Service: Vascular;  Laterality: Bilateral;  . ENDARTERECTOMY POPLITEAL Left 03/09/2018   Procedure: ENDARTERECTOMY POPLITEAL AND  SFA;  Surgeon: Algernon Huxley, MD;  Location: [facility];  Service: Vascular;  Laterality: Left;  . ESOPHAGOGASTRODUODENOSCOPY  03/05/2013  . ESOPHAGOGASTRODUODENOSCOPY (EGD) WITH PROPOFOL N/A 03/18/2019   Procedure: ESOPHAGOGASTRODUODENOSCOPY (EGD) WITH PROPOFOL;  Surgeon: Toledo, Benay Pike, MD;  Location: [facility];  Service: Gastroenterology;  Laterality: N/A;  .  EYE SURGERY    . Eyelid Surgery  2012  . INTRAMEDULLARY (IM) NAIL INTERTROCHANTERIC Left 10/30/2015   Procedure: INTRAMEDULLARY (IM) NAIL INTERTROCHANTRIC ;  Surgeon: Hessie Knows, MD;  Location: [facility];  Service: Orthopedics;  Laterality: Left;  . KYPHOPLASTY N/A 10/25/2018   Procedure: L4 KYPHOPLASTY;  Surgeon: Hessie Knows, MD;  Location: [facility];  Service: Orthopedics;  Laterality: N/A;  . LAPAROSCOPIC HYSTERECTOMY  2000   total  . LOWER EXTREMITY ANGIOGRAPHY Left 03/08/2017   Procedure: LOWER EXTREMITY ANGIOGRAPHY;  Surgeon: Algernon Huxley, MD;  Location: [facility];  Service: Cardiovascular;  Laterality: Left;  . LOWER EXTREMITY ANGIOGRAPHY Left 10/30/2017   Procedure: LOWER EXTREMITY ANGIOGRAPHY;  Surgeon: Algernon Huxley, MD;  Location: [facility];  Service: Cardiovascular;  Laterality: Left;  . LOWER EXTREMITY ANGIOGRAPHY Right 03/08/2018   Procedure: LOWER EXTREMITY ANGIOGRAPHY;  Surgeon: Algernon Huxley, MD;  Location: [place];  Service: Cardiovascular;  Laterality: Right;  . LOWER EXTREMITY ANGIOGRAPHY Left 10/01/2018   Procedure: LOWER EXTREMITY ANGIOGRAPHY;  Surgeon: Algernon Huxley, MD;  Location: [facility];  Service: Cardiovascular;  Laterality: Left;  . LOWER EXTREMITY ANGIOGRAPHY Right 10/08/2018   Procedure: LOWER EXTREMITY ANGIOGRAPHY;  Surgeon: Algernon Huxley, MD;  Location: [facility] [facility];  Service: Cardiovascular;  Laterality: Right;  . PERIPHERAL VASCULAR INTERVENTION  03/08/2018   Procedure: PERIPHERAL VASCULAR INTERVENTION;  Surgeon: Algernon Huxley, MD;  Location: [place] [facility];  Service: Cardiovascular;;  . PORTA CATH INSERTION N/A 02/17/2020   Procedure: PORTA CATH INSERTION;  Surgeon: Algernon Huxley, MD;  Location: [facility];  Service: Cardiovascular;  Laterality: N/A;  . REDUCTION MAMMAPLASTY  1997  . SACROPLASTY N/A 10/25/2018   Procedure: S1 SACROPLASTY;  Surgeon: Hessie Knows, MD;  Location: [facility];  Service:  Orthopedics;  Laterality: N/A;    SOCIAL HISTORY: Social History   Socioeconomic History  . Marital status: Married    Spouse name: Jenny Reichmann  . Number of children: 3  . Years of education: Not on file  . Highest education level: Not on file  Occupational History  . Occupation: Retail banker  Tobacco Use  . Smoking status: Former Smoker    Packs/day: 1.00    Years: 20.00    Pack years: 20.00    Types: Cigarettes    Quit date: 03/07/1996    Years since quitting: 24.1  . Smokeless tobacco: Never Used  . Tobacco comment: started smoking at age 60  Vaping Use  . Vaping Use: Never used  Substance and Sexual Activity  . Alcohol use: No    Alcohol/week: 0.0 standard drinks  . Drug use: No  . Sexual activity: Yes    Partners: Male    Birth control/protection: Surgical  Other Topics Concern  . Not on file  Social History Narrative   Lives in [place]; with husband; quit > 20 years; no alcohol; used to work at [employer] at [employer].    Social Determinants of Health   Financial Resource Strain: Not on file  Food Insecurity: Not on file  Transportation Needs: Not on file  Physical Activity: Not on file  Stress: Not on file  Social Connections: Not on file  Intimate Partner Violence: Not on file    FAMILY HISTORY: Family History  Problem Relation Age of Onset  . Coronary artery disease Father   . Heart attack Father   . Coronary artery disease Mother   . Heart attack Mother   . Ovarian cancer Sister 7       sister had hormonal therapy for IVF txs-which increased risk factor for ovarian cancer  . Breast cancer Neg Hx     ALLERGIES:  is allergic to ace inhibitors.  MEDICATIONS:  Current Outpatient Medications  Medication Sig Dispense Refill  . ALPRAZolam (XANAX) 0.25 MG tablet Take 0.25 mg by mouth daily as needed for anxiety or sleep.     [name] amLODipine (NORVASC) 5 MG tablet Take by mouth.    . Ascorbic Acid (VITAMIN C) 1000 MG tablet Take 1,000 mg by mouth daily.     [name] aspirin EC 81 MG tablet Take 81 mg by mouth daily.     . cefUROXime (CEFTIN) 250 MG tablet Take by mouth.    . cholecalciferol (VITAMIN D) 1000 units tablet Take 1,000 Units by mouth 2 (two) times daily.    [name] CORAL CALCIUM PO Take 1 tablet by mouth 2 (two) times daily.     [name] denosumab (PROLIA) 60 MG/ML SOLN injection Inject 60 mg into the skin every 6 (six) months.     . DULoxetine (CYMBALTA) 30 MG capsule Take 30 mg by mouth daily.    . famotidine (PEPCID) 40 MG tablet Take 40 mg by mouth 2 (two) times daily.     . furosemide (LASIX) 20 MG tablet Take 20 mg by mouth daily as needed for fluid or edema.     . gabapentin (NEURONTIN) 100 MG capsule Take 1 capsule by mouth daily.    . Insulin Pen Needle 31G X 5 MM MISC once daily.    [name] levothyroxine (SYNTHROID, LEVOTHROID) 100 MCG tablet Take 100 mcg by mouth daily before breakfast.   3  . lidocaine-prilocaine (EMLA) cream Apply 1 application topically as needed (apply prior to port a cath access). 30 g 3  . Magnesium 500 MG TABS Take 500 mg by mouth every morning.     . metFORMIN (GLUCOPHAGE) 1000 MG tablet Take 1 tablet by mouth 2 (two) times daily with a meal.    . methylPREDNISolone (MEDROL DOSEPAK) 4 MG TBPK tablet Take by mouth.    . mirabegron ER (MYRBETRIQ) 25 MG TB24 tablet Take 1 tablet (25 mg total) by mouth daily. 28 tablet 0  . Multiple Vitamin (MULTIVITAMIN WITH MINERALS) TABS tablet Take 1 tablet by mouth daily.    . mupirocin cream (BACTROBAN) 2 % Apply topically 2 (two) times daily.    . nitrofurantoin (MACRODANTIN) 100 MG capsule Take by mouth.    . nystatin cream (MYCOSTATIN) Apply 1 application topically 2 (two) times daily.    [name] olmesartan (BENICAR) 20 MG tablet Take 20 mg by mouth daily.    [name] OZEMPIC, 0.25 OR 0.5 MG/DOSE, 2 MG/1.5ML SOPN Inject into the skin.    . pantoprazole (PROTONIX) 40 MG tablet Take 40 mg by mouth every morning.     . rivaroxaban (XARELTO) 20 MG TABS tablet Take 1 tablet (20 mg total) by mouth daily  with supper. 30 tablet 11  . rosuvastatin (CRESTOR) 20 MG tablet Take 20 mg by mouth every morning.     [name] tiZANidine (ZANAFLEX) 4 MG tablet Take 4 mg  by mouth 3 (three) times daily.    . traMADol (ULTRAM) 50 MG tablet Take 50 mg by mouth every 6 (six) hours as needed.    . TRESIBA FLEXTOUCH 200 UNIT/ML SOPN Inject 30 Units as directed at bedtime.   5  . VASCEPA 1 g CAPS Take 1 capsule by mouth 2 (two) times daily.    . vitamin B-12 (CYANOCOBALAMIN) 1000 MCG tablet Take 1,000 mcg by mouth daily.    . vitamin E 400 UNIT capsule Take 400 Units by mouth daily.    [name] zolpidem (AMBIEN) 5 MG tablet Take 1 tablet (5 mg total) by mouth at bedtime. (Patient taking differently: Take 5 mg by mouth at bedtime as needed.) 30 tablet 0  . metoprolol succinate (TOPROL-XL) 50 MG 24 hr tablet Take by mouth.    . mirtazapine (REMERON) 15 MG tablet Take by mouth.     No current facility-administered medications for this visit.   Facility-Administered Medications Ordered in Other Visits  Medication Dose Route Frequency Provider Last Rate Last Admin  . 0.9 %  sodium chloride infusion   Intravenous Once Charlaine Dalton R, MD      . heparin lock flush 100 unit/mL  500 Units Intracatheter Once PRN Charlaine Dalton R, MD      . iron sucrose (VENOFER) injection 200 mg  200 mg Intravenous Once Charlaine Dalton R, MD      . sodium chloride flush (NS) 0.9 % injection 10 mL  10 mL Intracatheter Once PRN Cammie Sickle, MD          PHYSICAL EXAMINATION:   Vitals:   04/08/20 1307  BP: (!) 124/59  Pulse: 76  Resp: 20  Temp: 98 F (36.7 C)   Filed Weights   04/08/20 1307  Weight: 160 lb (72.6 kg)    Physical Exam Constitutional:      Comments: Alone.  Ambulating independently.  HENT:     Head: Normocephalic and atraumatic.     Mouth/Throat:     Pharynx: No oropharyngeal exudate.  Eyes:     Pupils: Pupils are equal, round, and reactive to light.  Cardiovascular:     Rate and Rhythm:  Normal rate and regular rhythm.  Pulmonary:     Effort: Pulmonary effort is normal. No respiratory distress.     Breath sounds: Normal breath sounds. No wheezing.  Abdominal:     General: Bowel sounds are normal. There is no distension.     Palpations: Abdomen is soft. There is no mass.     Tenderness: There is no abdominal tenderness. There is no guarding or rebound.  Musculoskeletal:        General: No tenderness. Normal range of motion.     Cervical back: Normal range of motion and neck supple.  Skin:    General: Skin is warm.  Neurological:     Mental Status: She is alert and oriented to person, place, and time.  Psychiatric:        Mood and Affect: Affect normal.     LABORATORY DATA:  I have reviewed the data as listed Lab Results  Component Value Date   WBC 6.3 04/08/2020   HGB 8.6 (L) 04/08/2020   HCT 27.6 (L) 04/08/2020   MCV 87.1 04/08/2020   PLT 294 04/08/2020   Recent Labs    06/05/19 1304 09/04/19 1249 11/06/19 1235 02/05/20 1251 04/01/20 1337 04/08/20 1247  NA 141 135 138 140  --  137  K 4.4 4.3 4.6 4.5  --  4.8  CL 104 103 103 106  --  105  CO2 26 21* 26 23  --  21*  GLUCOSE 202* 450* 154* 246*  --  210*  BUN 26* 25* 32* 31*  --  44*  CREATININE 1.13* 1.34* 1.14* 1.50* 1.10* 1.51*  CALCIUM 9.8 8.3* 9.9 9.3  --  9.9  GFRNONAA 48* 39* 51* 36*  --  36*  GFRAA 55* 45*  --   --   --   --      CT ABDOMEN PELVIS W CONTRAST  Result Date: 04/01/2020 CLINICAL DATA:  having LLQ pain for past 2 months. She denies any NVD. She deniesany swelling or lumps in that area. Hx of hysterectomy and appendectomy. She states when she has a BM it starts to hurt more than usual EXAM: CT ABDOMEN AND PELVIS WITH CONTRAST TECHNIQUE: Multidetector CT imaging of the abdomen and pelvis was performed using the standard protocol following bolus administration of intravenous contrast. CONTRAST:  143m OMNIPAQUE IOHEXOL 300 MG/ML  SOLN COMPARISON:  CT abdomen pelvis 11/26/2018  FINDINGS: Lower chest: Punctate calcified pulmonary nodules within the right lower lobe. Coronary artery calcifications. No acute abnormality. Hepatobiliary: The hepatic parenchyma is diffusely hypodense compared to the splenic parenchyma consistent with fatty infiltration. No focal liver abnormality. High density material within the gallbladder lumen might represent skull bladder sludge versus cholelithiasis. No gallbladder wall thickening or pericholecystic fluid. No biliary dilatation. Pancreas: No focal lesion. Normal pancreatic contour. No surrounding inflammatory changes. No main pancreatic ductal dilatation. Spleen: Normal in size without focal abnormality. Adrenals/Urinary Tract: No adrenal nodule bilaterally. Bilateral kidneys enhance symmetrically. No hydronephrosis. No hydroureter. Couple foci of gas within the urinary bladder lumen. Otherwise the urinary bladder is unremarkable. On delayed imaging, there is no urothelial wall thickening and there are no filling defects in the opacified portions of the bilateral collecting systems or ureters. Stomach/Bowel: PO contrast reaches the cecum. Stomach is within normal limits. No evidence of bowel wall thickening or dilatation. Few scattered colonic diverticula. The appendix is not definitely identified. Vascular/Lymphatic: Partially visualized bilateral femoral artery stents. No abdominal aorta or iliac aneurysm. At least moderate calcified and noncalcified atherosclerotic plaque of the aorta and its branches. No abdominal, pelvic, or inguinal lymphadenopathy. Reproductive: Status post hysterectomy. No adnexal masses. Other: No intraperitoneal free fluid. No intraperitoneal free gas. No organized fluid collection. Musculoskeletal: Bilateral fat containing inguinal hernias. No suspicious lytic or blastic osseous lesions. No acute displaced fracture. Multilevel degenerative changes of the spine. Similar-appearing compression fractures of the L3-L4 levels with  kyphoplasty at the L4 level. Redemonstration of bilateral sacroplasties. Partially visualized intramedullary nail fixation of the left proximal femur. IMPRESSION: 1. Couple foci of gas within the urinary bladder lumen. Correlate with recent instrumentation versus infection. 2. Scattered colonic diverticulosis with no acute diverticulitis. 3. Question gallbladder sludge versus cholelithiasis. Consider right upper quadrant ultrasound for further evaluation. 4. Bilateral fat containing inguinal hernias. These results will be called to the ordering clinician or representative by the Radiologist Assistant, and communication documented in the PACS or [employer] Electronically Signed   By: MIven FinnM.D.   On: 04/01/2020 21:32   Intravitreal Injection, Pharmacologic Agent - OD - Right Eye  Result Date: 04/03/2020 Time Out 04/03/2020. 2:48 PM. Confirmed correct patient, procedure, site, and patient consented. Anesthesia Topical anesthesia was used. Anesthetic medications included Lidocaine 2%, Proparacaine 0.5%. Procedure Preparation included 5% betadine to ocular surface, eyelid speculum. A (32g) needle was used. Injection: 2 mg aflibercept (EYLEA) SOLN  [place]: [id number], Lot: [id number], Expiration date: 10/02/2020   Route: Intravitreal, Site: Right Eye, Waste: 0.05 mL Post-op Post injection exam found visual acuity of at least counting fingers. The patient tolerated the procedure well. There were no complications. The patient received written and verbal post procedure care education. Post injection medications were not given.   OCT, Retina - OU - Both Eyes  Result Date: 04/03/2020 Right Eye Quality was good. Central Foveal Thickness: 365. Progression has worsened. Findings include abnormal foveal contour, epiretinal membrane, no SRF, intraretinal fluid (Mild Interval increase in IRF and foveal contour). Left Eye Quality was good. Central Foveal Thickness: 281. Progression has been stable. Findings  include normal foveal contour, no IRF, no SRF (Trace ERM). Notes *Images captured and stored on drive Diagnosis / Impression: OD: BRVO -- Mild Interval increase in IRF and foveal contour OS: NFP; no IRF/SRF--stable, trace ERM Clinical management: See below Abbreviations: NFP - Normal foveal profile. CME - cystoid macular edema. PED - pigment epithelial detachment. IRF - intraretinal fluid. SRF - subretinal fluid. EZ - ellipsoid zone. ERM - epiretinal membrane. ORA - outer retinal atrophy. ORT - outer retinal tubulation. SRHM - subretinal hyper-reflective material    Normocytic anemia # Anemia/likely secondary to CKD-III/iron deficiency.  Improved s/p IV iron infusions however today- Hb 8.1 . Proceed with Venofer today.    # DEC 2021- Iron saturation 12%; ferritin 14-anemia secondary to CKD.  Proceed with iron infusions as planned.  #Diabetes -BG-210 [Dr.Solum]-overall stable.  # Etiology-CKD-III; GFR 36 overall -STABLE.  [Dr.Kolluru]; recommend continued hydration.  #Poor IV access/Mediport placement-stable  # DISPOSITION: # Venofer today;  # Venofer weekly x-3 # follow up 3 months- MD; port; labs- cbc/bmp;iron studies/ferritin-  possible venofer- Dr.B   All questions were answered. The patient knows to call the clinic with any problems, questions or concerns.    Cammie Sickle, MD 04/08/2020 1:55 PM

## 2020-04-15 ENCOUNTER — Inpatient Hospital Stay: Payer: Medicare Other

## 2020-04-15 VITALS — BP 133/53 | HR 63 | Temp 98.8°F | Resp 18

## 2020-04-15 DIAGNOSIS — N189 Chronic kidney disease, unspecified: Secondary | ICD-10-CM | POA: Diagnosis not present

## 2020-04-15 DIAGNOSIS — D631 Anemia in chronic kidney disease: Secondary | ICD-10-CM

## 2020-04-15 DIAGNOSIS — N183 Chronic kidney disease, stage 3 unspecified: Secondary | ICD-10-CM

## 2020-04-15 DIAGNOSIS — Z95828 Presence of other vascular implants and grafts: Secondary | ICD-10-CM

## 2020-04-15 MED ORDER — SODIUM CHLORIDE 0.9 % IV SOLN
Freq: Once | INTRAVENOUS | Status: AC
  Filled 2020-04-15: qty 250

## 2020-04-15 MED ORDER — SODIUM CHLORIDE 0.9% FLUSH
10.0000 mL | Freq: Once | INTRAVENOUS | Status: AC
  Administered 2020-04-15: 10 mL via INTRAVENOUS
  Filled 2020-04-15: qty 10

## 2020-04-15 MED ORDER — HEPARIN SOD (PORK) LOCK FLUSH 100 UNIT/ML IV SOLN
INTRAVENOUS | Status: AC
  Filled 2020-04-15: qty 5

## 2020-04-15 MED ORDER — IRON SUCROSE 20 MG/ML IV SOLN
200.0000 mg | Freq: Once | INTRAVENOUS | Status: AC
  Administered 2020-04-15: 200 mg via INTRAVENOUS
  Filled 2020-04-15: qty 10

## 2020-04-15 MED ORDER — HEPARIN SOD (PORK) LOCK FLUSH 100 UNIT/ML IV SOLN
500.0000 [IU] | Freq: Once | INTRAVENOUS | Status: AC
  Administered 2020-04-15: 500 [IU] via INTRAVENOUS
  Filled 2020-04-15: qty 5

## 2020-04-22 ENCOUNTER — Telehealth (INDEPENDENT_AMBULATORY_CARE_PROVIDER_SITE_OTHER): Payer: Self-pay | Admitting: Vascular Surgery

## 2020-04-22 ENCOUNTER — Inpatient Hospital Stay: Payer: Medicare Other

## 2020-04-22 VITALS — BP 132/58 | HR 81 | Temp 97.1°F | Resp 18

## 2020-04-22 DIAGNOSIS — D631 Anemia in chronic kidney disease: Secondary | ICD-10-CM

## 2020-04-22 DIAGNOSIS — N189 Chronic kidney disease, unspecified: Secondary | ICD-10-CM | POA: Diagnosis not present

## 2020-04-22 MED ORDER — IRON SUCROSE 20 MG/ML IV SOLN
200.0000 mg | Freq: Once | INTRAVENOUS | Status: AC
  Administered 2020-04-22: 200 mg via INTRAVENOUS
  Filled 2020-04-22: qty 10

## 2020-04-22 MED ORDER — HEPARIN SOD (PORK) LOCK FLUSH 100 UNIT/ML IV SOLN
500.0000 [IU] | Freq: Once | INTRAVENOUS | Status: AC | PRN
  Administered 2020-04-22: 500 [IU]
  Filled 2020-04-22: qty 5

## 2020-04-22 MED ORDER — SODIUM CHLORIDE 0.9% FLUSH
10.0000 mL | Freq: Once | INTRAVENOUS | Status: AC | PRN
  Administered 2020-04-22: 10 mL
  Filled 2020-04-22: qty 10

## 2020-04-22 MED ORDER — SODIUM CHLORIDE 0.9 % IV SOLN
Freq: Once | INTRAVENOUS | Status: AC
  Filled 2020-04-22: qty 250

## 2020-04-27 ENCOUNTER — Ambulatory Visit (INDEPENDENT_AMBULATORY_CARE_PROVIDER_SITE_OTHER): Payer: Medicare Other | Admitting: Physician Assistant

## 2020-04-27 ENCOUNTER — Other Ambulatory Visit: Payer: Self-pay

## 2020-04-27 ENCOUNTER — Encounter: Payer: Self-pay | Admitting: Physician Assistant

## 2020-04-27 VITALS — BP 124/52 | HR 83 | Ht 65.0 in | Wt 160.0 lb

## 2020-04-27 DIAGNOSIS — Z87440 Personal history of urinary (tract) infections: Secondary | ICD-10-CM

## 2020-04-27 DIAGNOSIS — R3989 Other symptoms and signs involving the genitourinary system: Secondary | ICD-10-CM | POA: Diagnosis not present

## 2020-04-27 DIAGNOSIS — N3281 Overactive bladder: Secondary | ICD-10-CM

## 2020-04-27 LAB — MICROSCOPIC EXAMINATION: Bacteria, UA: NONE SEEN

## 2020-04-27 LAB — URINALYSIS, COMPLETE
Bilirubin, UA: NEGATIVE
Glucose, UA: NEGATIVE
Ketones, UA: NEGATIVE
Leukocytes,UA: NEGATIVE
Nitrite, UA: NEGATIVE
Protein,UA: NEGATIVE
RBC, UA: NEGATIVE
Specific Gravity, UA: <1.005 — ABNORMAL LOW (ref 1.005–1.030)
Urobilinogen, Ur: 0.2 mg/dL (ref 0.2–1.0)
pH, UA: 5 (ref 5.0–7.5)

## 2020-04-27 LAB — BLADDER SCAN AMB NON-IMAGING: Scan Result: 19 mL

## 2020-04-27 MED ORDER — ESTRADIOL 0.1 MG/GM VA CREA: TOPICAL_CREAM | VAGINAL | 5 refills | Status: DC

## 2020-04-27 MED ORDER — MIRABEGRON ER 50 MG PO TB24
50.0000 mg | ORAL_TABLET | Freq: Every day | ORAL | 0 refills | Status: DC
Start: 2020-04-27 — End: 2020-08-04

## 2020-04-27 NOTE — Telephone Encounter (Signed)
Patient came into office 04/22/2020 around 3:45 requesting to see Dr. Wyn Quaker.  I consulted with FB as patient was adamant to see someone,FB stated that patient should check with Urgent Care as we don't have anything in clinic to stop bleeding.  I relayed message to patient.    Patient called 4/22 and states one of of her toes on right foot is discolored right leg swollen.  Patient is requesting to see Dr. Wyn Quaker.  Please advise.

## 2020-04-27 NOTE — Patient Instructions (Addendum)
Recurrent UTI Prevention Strategies  1. Start taking an over-the-counter cranberry supplement for urinary tract health. Take this once or twice daily on an empty stomach, e.g. right before bed. 2. Start taking an over-the-counter probiotic, preferably containing lactobacillus. Take this daily. 3. Start vaginal estrogen cream. Apply a pea-sized amount around the urethra every day for two weeks, then three times weekly thereafter.   OAB We are increasing your dose of Myrbetriq to 50mg  daily today; I provided you with samples.  Gas in your bladder There was gas seen in your bladder on your recent CT scan. I'm checking your urine for infection today to see if this could be the cause. Alternatively, there could be a connection between your bladder and another structure called a fistula that is causing this. I recommend a cystoscopy with Dr. to evaluate you for this.   Cystoscopy Cystoscopy is a procedure that is used to help diagnose and sometimes treat conditions that affect the lower urinary tract. The lower urinary tract includes the bladder and the urethra. The urethra is the tube that drains urine from the bladder. Cystoscopy is done using a thin, tube-shaped instrument with a light and camera at the end (cystoscope). The cystoscope may be hard or flexible, depending on the goal of the procedure. The cystoscope is inserted through the urethra, into the bladder. Cystoscopy may be recommended if you have:  Urinary tract infections that keep coming back.  Blood in the urine (hematuria).  An inability to control when you urinate (urinary incontinence) or an overactive bladder.  Unusual cells found in a urine sample.  A blockage in the urethra, such as a urinary stone.  Painful urination.  An abnormality in the bladder found during an intravenous pyelogram (IVP) or CT scan. Cystoscopy may also be done to remove a sample of tissue to be examined under a microscope (biopsy). Tell a  health care provider about:  Any allergies you have.  All medicines you are taking, including vitamins, herbs, eye drops, creams, and over-the-counter medicines.  Any problems you or family members have had with anesthetic medicines.  Any blood disorders you have.  Any surgeries you have had.  Any medical conditions you have.  Whether you are pregnant or may be pregnant. What are the risks? Generally, this is a safe procedure. However, problems may occur, including:  Infection.  Bleeding.  Allergic reactions to medicines.  Damage to other structures or organs. What happens before the procedure? Medicines Ask your health care provider about:  Changing or stopping your regular medicines. This is especially important if you are taking diabetes medicines or blood thinners.  Taking medicines such as aspirin and ibuprofen. These medicines can thin your blood. Do not take these medicines unless your health care provider tells you to take them.  Taking over-the-counter medicines, vitamins, herbs, and supplements. Tests You may have an exam or testing, such as:  X-rays of the bladder, urethra, or kidneys.  CT scan of the abdomen or pelvis.  Urine tests to check for signs of infection. General instructions  Follow instructions from your health care provider about eating or drinking restrictions.  Ask your health care provider what steps will be taken to help prevent infection. These steps may include: ? Washing skin with a germ-killing soap. ? Taking antibiotic medicine.  Plan to have a responsible adult take you home from the hospital or clinic. What happens during the procedure?  You will be given one or more of the following: ? A  medicine to help you relax (sedative). ? A medicine to numb the area (local anesthetic).  The area around the opening of your urethra will be cleaned.  The cystoscope will be passed through your urethra into your bladder.  Germ-free  (sterile) fluid will flow through the cystoscope to fill your bladder. The fluid will stretch your bladder so that your health care provider can clearly examine your bladder walls.  Your doctor will look at the urethra and bladder. Your doctor may take a biopsy or remove stones.  The cystoscope will be removed, and your bladder will be emptied. The procedure may vary among health care providers and hospitals.   What can I expect after the procedure? After the procedure, it is common to have:  Some soreness or pain in your abdomen and urethra.  Urinary symptoms. These include: ? Mild pain or burning when you urinate. Pain should stop within a few minutes after you urinate. This may last for up to 1 week. ? A small amount of blood in your urine for several days. ? Feeling like you need to urinate but producing only a small amount of urine. Follow these instructions at home: Medicines  Take over-the-counter and prescription medicines only as told by your health care provider.  If you were prescribed an antibiotic medicine, take it as told by your health care provider. Do not stop taking the antibiotic even if you start to feel better. General instructions  Return to your normal activities as told by your health care provider. Ask your health care provider what activities are safe for you.  If you were given a sedative during the procedure, it can affect you for several hours. Do not drive or operate machinery until your health care provider says that it is safe.  Watch for any blood in your urine. If the amount of blood in your urine increases, call your health care provider.  Follow instructions from your health care provider about eating or drinking restrictions.  If a tissue sample was removed for testing (biopsy) during your procedure, it is up to you to get your test results. Ask your health care provider, or the department that is doing the test, when your results will be  ready.  Drink enough fluid to keep your urine pale yellow.  Keep all follow-up visits. This is important. Contact a health care provider if:  You have pain that gets worse or does not get better with medicine, especially pain when you urinate.  You have trouble urinating.  You have more blood in your urine. Get help right away if:  You have blood clots in your urine.  You have abdominal pain.  You have a fever or chills.  You are unable to urinate. Summary  Cystoscopy is a procedure that is used to help diagnose and sometimes treat conditions that affect the lower urinary tract.  Cystoscopy is done using a thin, tube-shaped instrument with a light and camera at the end.  After the procedure, it is common to have some soreness or pain in your abdomen and urethra.  Watch for any blood in your urine. If the amount of blood in your urine increases, call your health care provider.  If you were prescribed an antibiotic medicine, take it as told by your health care provider. Do not stop taking the antibiotic even if you start to feel better. This information is not intended to replace advice given to you by your health care provider. Make sure you discuss  any questions you have with your health care provider. Document Revised: 08/02/2019 Document Reviewed: 08/02/2019 Elsevier Patient Education  2021 [employer].

## 2020-04-27 NOTE — Telephone Encounter (Signed)
Bring her in with ABIs with dew per her request

## 2020-04-27 NOTE — Progress Notes (Signed)
04/27/2020 9:03 AM   Cheryl Leonard 01-Apr-1945 [id number]  CC: Chief Complaint  Patient presents with  . Recurrent UTI   HPI: Cheryl Leonard is a 75 y.o. female with PMH recurrent UTI, OSA, and OAB wet who presents today for symptom recheck and PVR on Myrbetriq 25 mg daily.  Today she reports improvement in her urinary symptoms on Myrbetriq.  She reports daytime frequency x4, nocturia x2-3, and small-volume urge incontinence for which she wears 3 pads daily.  She notes an increase in her nocturnal urge incontinence over the past 3 days.  She also notes some urinary leakage without awareness that she sees in her pads.  She denies any increased vaginal discharge.  She has a history of diverticulosis, but no history of diverticulitis.  She is taking Align probiotics daily.  She underwent CTAP with contrast on 04/01/2020 for evaluation of LLQ pain.  CT revealed small foci of gas within the urinary bladder with no recent instrumentation.  No evidence of fistula on CT, though she does have a history of multiple abdominal surgeries including abdominal hysterectomy, appendectomy, and cesarean section. No history of pelvic radiation.  In office UA and microscopy today pan negative. PVR 73mL.  PMH: Past Medical History:  Diagnosis Date  . Anemia   . Anxiety   . Arthritis    Gout  . Cataracts, both eyes   . Diabetic retinopathy (HCC)    NPDR OU  . Diabetic retinopathy (HCC)   . GERD (gastroesophageal reflux disease)   . Gout   . Headache    h/o migraines  . History of fracture of patella    right knee  . History of positive PPD    Patient always shows positive  . Hyperlipidemia   . Hypertension   . Hypertensive retinopathy    OU  . Hypothyroidism   . Lichen sclerosus 12/30/2013   of vulva  . Metatarsal fracture   . Neuropathy   . Peripheral vascular disease (HCC)   . Polyneuropathy    numbness and tingling in feet and toes  . Renal insufficiency    Stage 3  .  Sleep apnea    does not use cpap-lost weight   . Type 2 diabetes mellitus, uncontrolled (HCC)     Surgical History: Past Surgical History:  Procedure Laterality Date  . ABDOMINAL HYSTERECTOMY    . APPENDECTOMY    . BREAST REDUCTION SURGERY  2001  . CATARACT EXTRACTION    . CESAREAN SECTION  1976  . COLONOSCOPY  03/05/2013   Nml - due for repeat 03/06/2018  . COLONOSCOPY WITH PROPOFOL N/A 03/18/2019   Procedure: COLONOSCOPY WITH PROPOFOL;  Surgeon: Toledo, [name], MD;  Location: [facility];  Service: Gastroenterology;  Laterality: N/A;  . DIAGNOSTIC LAPAROSCOPY    . DILATION AND CURETTAGE OF UTERUS  1989  . ENDARTERECTOMY FEMORAL Bilateral 03/09/2018   Procedure: ENDARTERECTOMY FEMORAL;  Surgeon: Annice Needy, MD;  Location: [facility];  Service: Vascular;  Laterality: Bilateral;  . ENDARTERECTOMY POPLITEAL Left 03/09/2018   Procedure: ENDARTERECTOMY POPLITEAL AND SFA;  Surgeon: Annice Needy, MD;  Location: [facility];  Service: Vascular;  Laterality: Left;  . ESOPHAGOGASTRODUODENOSCOPY  03/05/2013  . ESOPHAGOGASTRODUODENOSCOPY (EGD) WITH PROPOFOL N/A 03/18/2019   Procedure: ESOPHAGOGASTRODUODENOSCOPY (EGD) WITH PROPOFOL;  Surgeon: Toledo, [name], MD;  Location: [facility];  Service: Gastroenterology;  Laterality: N/A;  . EYE SURGERY    . Eyelid Surgery  2012  . INTRAMEDULLARY (IM) NAIL INTERTROCHANTERIC Left 10/30/2015  Procedure: INTRAMEDULLARY (IM) NAIL INTERTROCHANTRIC ;  Surgeon: Kennedy Bucker, MD;  Location: [facility];  Service: Orthopedics;  Laterality: Left;  . KYPHOPLASTY N/A 10/25/2018   Procedure: L4 KYPHOPLASTY;  Surgeon: Kennedy Bucker, MD;  Location: [facility];  Service: Orthopedics;  Laterality: N/A;  . LAPAROSCOPIC HYSTERECTOMY  2000   total  . LOWER EXTREMITY ANGIOGRAPHY Left 03/08/2017   Procedure: LOWER EXTREMITY ANGIOGRAPHY;  Surgeon: Annice Needy, MD;  Location: [facility];  Service: Cardiovascular;  Laterality: Left;  . LOWER EXTREMITY ANGIOGRAPHY Left  10/30/2017   Procedure: LOWER EXTREMITY ANGIOGRAPHY;  Surgeon: Annice Needy, MD;  Location: [facility];  Service: Cardiovascular;  Laterality: Left;  . LOWER EXTREMITY ANGIOGRAPHY Right 03/08/2018   Procedure: LOWER EXTREMITY ANGIOGRAPHY;  Surgeon: Annice Needy, MD;  Location: [facility];  Service: Cardiovascular;  Laterality: Right;  . LOWER EXTREMITY ANGIOGRAPHY Left 10/01/2018   Procedure: LOWER EXTREMITY ANGIOGRAPHY;  Surgeon: Annice Needy, MD;  Location: [facility];  Service: Cardiovascular;  Laterality: Left;  . LOWER EXTREMITY ANGIOGRAPHY Right 10/08/2018   Procedure: LOWER EXTREMITY ANGIOGRAPHY;  Surgeon: Annice Needy, MD;  Location: [facility];  Service: Cardiovascular;  Laterality: Right;  . PERIPHERAL VASCULAR INTERVENTION  03/08/2018   Procedure: PERIPHERAL VASCULAR INTERVENTION;  Surgeon: Annice Needy, MD;  Location: [facility];  Service: Cardiovascular;;  . PORTA CATH INSERTION N/A 02/17/2020   Procedure: PORTA CATH INSERTION;  Surgeon: Annice Needy, MD;  Location: [facility];  Service: Cardiovascular;  Laterality: N/A;  . REDUCTION MAMMAPLASTY  1997  . SACROPLASTY N/A 10/25/2018   Procedure: S1 SACROPLASTY;  Surgeon: Kennedy Bucker, MD;  Location: [facility];  Service: Orthopedics;  Laterality: N/A;    Home Medications:  Allergies as of 04/27/2020      Reactions   Ace Inhibitors Other (See Comments)      Medication List       Accurate as of April 27, 2020  9:03 AM. If you have any questions, ask your nurse or doctor.        STOP taking these medications   amLODipine 5 MG tablet Commonly known as: NORVASC Stopped by: Carman Ching, PA-C   cefUROXime 250 MG tablet Commonly known as: CEFTIN Stopped by: Carman Ching, PA-C   methylPREDNISolone 4 MG Tbpk tablet Commonly known as: MEDROL DOSEPAK Stopped by: Carman Ching, PA-C   Ozempic (0.25 or 0.5 MG/DOSE) 2 MG/1.5ML Sopn Generic drug:  Semaglutide(0.25 or 0.5MG /DOS) Stopped by: Carman Ching, PA-C   tiZANidine 4 MG tablet Commonly known as: ZANAFLEX Stopped by: Carman Ching, PA-C   traMADol 50 MG tablet Commonly known as: ULTRAM Stopped by: Carman Ching, PA-C   Vascepa 1 g capsule Generic drug: icosapent Ethyl Stopped by: Carman Ching, PA-C   vitamin B-12 1000 MCG tablet Commonly known as: CYANOCOBALAMIN Stopped by: Carman Ching, PA-C   vitamin C 1000 MG tablet Stopped by: Carman Ching, PA-C     TAKE these medications   ALPRAZolam 0.25 MG tablet Commonly known as: XANAX Take 0.25 mg by mouth daily as needed for anxiety or sleep.   aspirin EC 81 MG tablet Take 81 mg by mouth daily.   cholecalciferol 1000 units tablet Commonly known as: VITAMIN D Take 1,000 Units by mouth 2 (two) times daily.   CORAL CALCIUM PO Take 1 tablet by mouth 2 (two) times daily.   denosumab 60 MG/ML Soln injection Commonly known as: PROLIA Inject 60 mg into the  skin every 6 (six) months.   DULoxetine 30 MG capsule Commonly known as: CYMBALTA Take 30 mg by mouth daily.   famotidine 40 MG tablet Commonly known as: PEPCID Take 40 mg by mouth 2 (two) times daily.   furosemide 20 MG tablet Commonly known as: LASIX Take 20 mg by mouth daily as needed for fluid or edema.   gabapentin 100 MG capsule Commonly known as: NEURONTIN Take 1 capsule by mouth daily.   Insulin Pen Needle 31G X 5 MM Misc once daily.   levothyroxine 100 MCG tablet Commonly known as: SYNTHROID Take 100 mcg by mouth daily before breakfast.   lidocaine-prilocaine cream Commonly known as: EMLA Apply 1 application topically as needed (apply prior to port a cath access).   Magnesium 500 MG Tabs Take 500 mg by mouth every morning.   metFORMIN 1000 MG tablet Commonly known as: GLUCOPHAGE Take 1 tablet by mouth 2 (two) times daily with a meal.   metoprolol succinate 50 MG 24 hr  tablet Commonly known as: TOPROL-XL Take by mouth.   mirabegron ER 25 MG Tb24 tablet Commonly known as: MYRBETRIQ Take 1 tablet (25 mg total) by mouth daily.   mirtazapine 15 MG tablet Commonly known as: REMERON Take by mouth.   multivitamin with minerals Tabs tablet Take 1 tablet by mouth daily.   mupirocin cream 2 % Commonly known as: BACTROBAN Apply topically 2 (two) times daily.   nitrofurantoin 100 MG capsule Commonly known as: MACRODANTIN Take by mouth.   nystatin cream Commonly known as: MYCOSTATIN Apply 1 application topically 2 (two) times daily.   olmesartan 20 MG tablet Commonly known as: BENICAR Take 20 mg by mouth daily.   pantoprazole 40 MG tablet Commonly known as: PROTONIX Take 40 mg by mouth every morning.   rivaroxaban 20 MG Tabs tablet Commonly known as: XARELTO Take 1 tablet (20 mg total) by mouth daily with supper.   rosuvastatin 20 MG tablet Commonly known as: CRESTOR Take 20 mg by mouth every morning.   Evaristo Bury FlexTouch 200 UNIT/ML FlexTouch Pen Generic drug: insulin degludec Inject 30 Units as directed at bedtime.   vitamin E 180 MG (400 UNITS) capsule Take 400 Units by mouth daily.   zolpidem 5 MG tablet Commonly known as: AMBIEN Take 1 tablet (5 mg total) by mouth at bedtime. What changed:   when to take this  reasons to take this       Allergies:  Allergies  Allergen Reactions  . Ace Inhibitors Other (See Comments)    Family History: Family History  Problem Relation Age of Onset  . Coronary artery disease Father   . Heart attack Father   . Coronary artery disease Mother   . Heart attack Mother   . Ovarian cancer Sister 36       sister had hormonal therapy for IVF txs-which increased risk factor for ovarian cancer  . Breast cancer Neg Hx     Social History:   reports that she quit smoking about 24 years ago. Her smoking use included cigarettes. She has a 20.00 pack-year smoking history. She has never used  smokeless tobacco. She reports that she does not drink alcohol and does not use drugs.  Physical Exam: BP (!) 124/52 (BP Location: Left Arm, Patient Position: Sitting, Cuff Size: Normal)   Pulse 83   Ht 5\' 5"  (1.651 m)   Wt 160 lb (72.6 kg)   BMI 26.63 kg/m   Constitutional:  Alert and oriented, no acute distress, nontoxic  appearing HEENT: [place], AT Cardiovascular: No clubbing, cyanosis, or edema Respiratory: Normal respiratory effort, no increased work of breathing Skin: No rashes, bruises or suspicious lesions Neurologic: Grossly intact, no focal deficits, moving all 4 extremities Psychiatric: Normal mood and affect  Laboratory Data: Results for orders placed or performed in visit on 04/27/20  Microscopic Examination   Urine  Result Value Ref Range   WBC, UA 0-5 0 - 5 /hpf   RBC 0-2 0 - 2 /hpf   Epithelial Cells (non renal) 0-10 0 - 10 /hpf   Bacteria, UA None seen None seen/Few  Urinalysis, Complete  Result Value Ref Range   Specific Gravity, UA <1.005 (L) 1.005 - 1.030   pH, UA 5.0 5.0 - 7.5   Color, UA Yellow Yellow   Appearance Ur Hazy (A) Clear   Leukocytes,UA Negative Negative   Protein,UA Negative Negative/Trace   Glucose, UA Negative Negative   Ketones, UA Negative Negative   RBC, UA Negative Negative   Bilirubin, UA Negative Negative   Urobilinogen, Ur 0.2 0.2 - 1.0 mg/dL   Nitrite, UA Negative Negative   Microscopic Examination See below:   BLADDER SCAN AMB NON-IMAGING  Result Value Ref Range   Scan Result 19 mL   Assessment & Plan:   1. Overactive bladder Symptoms improved on Myrbetriq 25 mg daily.  Patient feels there is room for improvement.  I offered her a full 3 months of Myrbetriq 25 mg versus increased dose of Myrbetriq and she elected for the latter.  I provided her with 1 month of samples today. - BLADDER SCAN AMB NON-IMAGING - mirabegron ER (MYRBETRIQ) 50 MG TB24 tablet; Take 1 tablet (50 mg total) by mouth daily.  Dispense: 28 tablet; Refill:  0  2. History of recurrent UTI (urinary tract infection) Not clinically infected today.  Counseled her to continue daily probiotics, start cranberry supplements, and start topical vaginal estrogen cream.  I explained that topical vaginal estrogen has multiple mechanisms of action to reduce the frequency of UTI including bulking up the urogenital tissue, increasing self-lubrication, acidifying the urogenital environment, and rebalancing the microbiome. Additionally, I explained that topical vaginal estrogen is not expected to be associated with systemic hormone absorption, and it has not been shown to increase the risk for heart attack, stroke, or other clots. - estradiol (ESTRACE) 0.1 MG/GM vaginal cream; Apply one pea-sized amount around the opening of the urethra daily for two weeks, then three times weekly thereafter.  Dispense: 42.5 g; Refill: 5  3. Pneumaturia Noted on CT in the absence of infection or recent instrumentation.  With benign UA today, I have low suspicion for colovesical fistula, however given her reports of insensate urinary leakage, I wonder if she may have a colovaginal fistula.  I recommended cystoscopy for further evaluation of possible disruptions in the bladder mucosa that would be consistent with this.  She expressed understanding. - Urinalysis, Complete   Return in about 2 weeks (around 05/11/2020) for Cystoscopy with Dr. Lonna CobbStoioff.  Carman ChingSamantha Vivan Agostino, PA-C  [facility] [address], [address] [place], [place] [address] [phone]

## 2020-04-27 NOTE — Telephone Encounter (Signed)
Called to let patient know we are working on an answer for her, spoke to husband and husband stated that ulcer is clearing up and that she is currently at her foot doctor this morning. FYI

## 2020-04-29 ENCOUNTER — Other Ambulatory Visit: Payer: Self-pay

## 2020-04-29 ENCOUNTER — Inpatient Hospital Stay: Payer: Medicare Other

## 2020-04-29 VITALS — BP 103/61 | HR 76 | Temp 98.4°F | Resp 16

## 2020-04-29 DIAGNOSIS — D631 Anemia in chronic kidney disease: Secondary | ICD-10-CM

## 2020-04-29 DIAGNOSIS — N189 Chronic kidney disease, unspecified: Secondary | ICD-10-CM | POA: Diagnosis not present

## 2020-04-29 MED ORDER — IRON SUCROSE 20 MG/ML IV SOLN
200.0000 mg | Freq: Once | INTRAVENOUS | Status: AC
  Administered 2020-04-29: 200 mg via INTRAVENOUS
  Filled 2020-04-29: qty 10

## 2020-04-29 MED ORDER — HEPARIN SOD (PORK) LOCK FLUSH 100 UNIT/ML IV SOLN
INTRAVENOUS | Status: AC
  Filled 2020-04-29: qty 5

## 2020-04-29 MED ORDER — HEPARIN SOD (PORK) LOCK FLUSH 100 UNIT/ML IV SOLN
500.0000 [IU] | Freq: Once | INTRAVENOUS | Status: AC
  Administered 2020-04-29: 500 [IU] via INTRAVENOUS
  Filled 2020-04-29: qty 5

## 2020-04-29 MED ORDER — SODIUM CHLORIDE 0.9 % IV SOLN
Freq: Once | INTRAVENOUS | Status: AC
Start: 2020-04-29 — End: 2020-04-29
  Filled 2020-04-29: qty 250

## 2020-04-29 NOTE — Patient Instructions (Signed)

## 2020-05-04 ENCOUNTER — Encounter: Payer: Self-pay | Admitting: Emergency Medicine

## 2020-05-04 ENCOUNTER — Other Ambulatory Visit: Payer: Self-pay

## 2020-05-04 ENCOUNTER — Emergency Department: Payer: Medicare Other

## 2020-05-04 ENCOUNTER — Encounter (INDEPENDENT_AMBULATORY_CARE_PROVIDER_SITE_OTHER): Payer: Medicare Other | Admitting: Ophthalmology

## 2020-05-04 ENCOUNTER — Inpatient Hospital Stay
Admission: EM | Admit: 2020-05-04 | Discharge: 2020-05-09 | DRG: 617 | Disposition: A | Discharge status: Home / Self Care | Payer: Medicare Other | Attending: Hospitalist | Admitting: Hospitalist

## 2020-05-04 DIAGNOSIS — E1142 Type 2 diabetes mellitus with diabetic polyneuropathy: Secondary | ICD-10-CM | POA: Diagnosis present

## 2020-05-04 DIAGNOSIS — I129 Hypertensive chronic kidney disease with stage 1 through stage 4 chronic kidney disease, or unspecified chronic kidney disease: Secondary | ICD-10-CM | POA: Diagnosis present

## 2020-05-04 DIAGNOSIS — E1165 Type 2 diabetes mellitus with hyperglycemia: Secondary | ICD-10-CM | POA: Diagnosis not present

## 2020-05-04 DIAGNOSIS — Z8041 Family history of malignant neoplasm of ovary: Secondary | ICD-10-CM

## 2020-05-04 DIAGNOSIS — E782 Mixed hyperlipidemia: Secondary | ICD-10-CM | POA: Diagnosis present

## 2020-05-04 DIAGNOSIS — I739 Peripheral vascular disease, unspecified: Secondary | ICD-10-CM | POA: Diagnosis present

## 2020-05-04 DIAGNOSIS — E875 Hyperkalemia: Secondary | ICD-10-CM | POA: Diagnosis present

## 2020-05-04 DIAGNOSIS — I96 Gangrene, not elsewhere classified: Secondary | ICD-10-CM | POA: Diagnosis present

## 2020-05-04 DIAGNOSIS — Z7982 Long term (current) use of aspirin: Secondary | ICD-10-CM | POA: Diagnosis not present

## 2020-05-04 DIAGNOSIS — K219 Gastro-esophageal reflux disease without esophagitis: Secondary | ICD-10-CM | POA: Diagnosis present

## 2020-05-04 DIAGNOSIS — L97519 Non-pressure chronic ulcer of other part of right foot with unspecified severity: Secondary | ICD-10-CM | POA: Diagnosis present

## 2020-05-04 DIAGNOSIS — Z7901 Long term (current) use of anticoagulants: Secondary | ICD-10-CM | POA: Diagnosis not present

## 2020-05-04 DIAGNOSIS — F32A Depression, unspecified: Secondary | ICD-10-CM | POA: Diagnosis present

## 2020-05-04 DIAGNOSIS — Z7984 Long term (current) use of oral hypoglycemic drugs: Secondary | ICD-10-CM | POA: Diagnosis not present

## 2020-05-04 DIAGNOSIS — Z79890 Hormone replacement therapy (postmenopausal): Secondary | ICD-10-CM

## 2020-05-04 DIAGNOSIS — IMO0002 Reserved for concepts with insufficient information to code with codable children: Secondary | ICD-10-CM | POA: Diagnosis present

## 2020-05-04 DIAGNOSIS — M869 Osteomyelitis, unspecified: Secondary | ICD-10-CM | POA: Diagnosis present

## 2020-05-04 DIAGNOSIS — Z79899 Other long term (current) drug therapy: Secondary | ICD-10-CM | POA: Diagnosis not present

## 2020-05-04 DIAGNOSIS — Z90710 Acquired absence of both cervix and uterus: Secondary | ICD-10-CM

## 2020-05-04 DIAGNOSIS — B001 Herpesviral vesicular dermatitis: Secondary | ICD-10-CM | POA: Diagnosis present

## 2020-05-04 DIAGNOSIS — D508 Other iron deficiency anemias: Secondary | ICD-10-CM | POA: Diagnosis not present

## 2020-05-04 DIAGNOSIS — B999 Unspecified infectious disease: Secondary | ICD-10-CM

## 2020-05-04 DIAGNOSIS — Z8249 Family history of ischemic heart disease and other diseases of the circulatory system: Secondary | ICD-10-CM

## 2020-05-04 DIAGNOSIS — F419 Anxiety disorder, unspecified: Secondary | ICD-10-CM | POA: Diagnosis present

## 2020-05-04 DIAGNOSIS — E039 Hypothyroidism, unspecified: Secondary | ICD-10-CM | POA: Diagnosis not present

## 2020-05-04 DIAGNOSIS — I70239 Atherosclerosis of native arteries of right leg with ulceration of unspecified site: Secondary | ICD-10-CM | POA: Diagnosis not present

## 2020-05-04 DIAGNOSIS — Z794 Long term (current) use of insulin: Secondary | ICD-10-CM

## 2020-05-04 DIAGNOSIS — E11621 Type 2 diabetes mellitus with foot ulcer: Secondary | ICD-10-CM | POA: Diagnosis present

## 2020-05-04 DIAGNOSIS — E1122 Type 2 diabetes mellitus with diabetic chronic kidney disease: Secondary | ICD-10-CM | POA: Diagnosis present

## 2020-05-04 DIAGNOSIS — Z20822 Contact with and (suspected) exposure to covid-19: Secondary | ICD-10-CM | POA: Diagnosis present

## 2020-05-04 DIAGNOSIS — I10 Essential (primary) hypertension: Secondary | ICD-10-CM | POA: Diagnosis present

## 2020-05-04 DIAGNOSIS — M19079 Primary osteoarthritis, unspecified ankle and foot: Secondary | ICD-10-CM | POA: Diagnosis present

## 2020-05-04 DIAGNOSIS — E113293 Type 2 diabetes mellitus with mild nonproliferative diabetic retinopathy without macular edema, bilateral: Secondary | ICD-10-CM | POA: Diagnosis present

## 2020-05-04 DIAGNOSIS — E1169 Type 2 diabetes mellitus with other specified complication: Principal | ICD-10-CM | POA: Diagnosis present

## 2020-05-04 DIAGNOSIS — E1152 Type 2 diabetes mellitus with diabetic peripheral angiopathy with gangrene: Secondary | ICD-10-CM | POA: Diagnosis present

## 2020-05-04 DIAGNOSIS — Z09 Encounter for follow-up examination after completed treatment for conditions other than malignant neoplasm: Secondary | ICD-10-CM

## 2020-05-04 DIAGNOSIS — Z888 Allergy status to other drugs, medicaments and biological substances status: Secondary | ICD-10-CM | POA: Diagnosis not present

## 2020-05-04 DIAGNOSIS — M86179 Other acute osteomyelitis, unspecified ankle and foot: Secondary | ICD-10-CM | POA: Diagnosis present

## 2020-05-04 DIAGNOSIS — N1832 Chronic kidney disease, stage 3b: Secondary | ICD-10-CM | POA: Diagnosis not present

## 2020-05-04 DIAGNOSIS — I70263 Atherosclerosis of native arteries of extremities with gangrene, bilateral legs: Secondary | ICD-10-CM | POA: Diagnosis present

## 2020-05-04 DIAGNOSIS — Z87891 Personal history of nicotine dependence: Secondary | ICD-10-CM

## 2020-05-04 DIAGNOSIS — M79676 Pain in unspecified toe(s): Secondary | ICD-10-CM | POA: Diagnosis present

## 2020-05-04 DIAGNOSIS — M109 Gout, unspecified: Secondary | ICD-10-CM | POA: Diagnosis present

## 2020-05-04 DIAGNOSIS — D509 Iron deficiency anemia, unspecified: Secondary | ICD-10-CM | POA: Diagnosis present

## 2020-05-04 LAB — GLUCOSE, CAPILLARY: Glucose-Capillary: 105 mg/dL — ABNORMAL HIGH (ref 70–99)

## 2020-05-04 LAB — CBC WITH DIFFERENTIAL/PLATELET
Abs Immature Granulocytes: 0.06 10*3/uL (ref 0.00–0.07)
Basophils Absolute: 0 10*3/uL (ref 0.0–0.1)
Basophils Relative: 1 %
Eosinophils Absolute: 0.2 10*3/uL (ref 0.0–0.5)
Eosinophils Relative: 3 %
HCT: 26 % — ABNORMAL LOW (ref 36.0–46.0)
Hemoglobin: 8.2 g/dL — ABNORMAL LOW (ref 12.0–15.0)
Immature Granulocytes: 1 %
Lymphocytes Relative: 22 %
Lymphs Abs: 1.5 10*3/uL (ref 0.7–4.0)
MCH: 28.1 pg (ref 26.0–34.0)
MCHC: 31.5 g/dL (ref 30.0–36.0)
MCV: 89 fL (ref 80.0–100.0)
Monocytes Absolute: 0.6 10*3/uL (ref 0.1–1.0)
Monocytes Relative: 9 %
Neutro Abs: 4.4 10*3/uL (ref 1.7–7.7)
Neutrophils Relative %: 64 %
Platelets: 326 10*3/uL (ref 150–400)
RBC: 2.92 MIL/uL — ABNORMAL LOW (ref 3.87–5.11)
RDW: 18.2 % — ABNORMAL HIGH (ref 11.5–15.5)
WBC: 6.8 10*3/uL (ref 4.0–10.5)
nRBC: 0.4 % — ABNORMAL HIGH (ref 0.0–0.2)

## 2020-05-04 LAB — BASIC METABOLIC PANEL
Anion gap: 9 (ref 5–15)
BUN: 33 mg/dL — ABNORMAL HIGH (ref 8–23)
CO2: 21 mmol/L — ABNORMAL LOW (ref 22–32)
Calcium: 9.5 mg/dL (ref 8.9–10.3)
Chloride: 106 mmol/L (ref 98–111)
Creatinine, Ser: 1.32 mg/dL — ABNORMAL HIGH (ref 0.44–1.00)
GFR, Estimated: 42 mL/min — ABNORMAL LOW (ref >60)
Glucose, Bld: 196 mg/dL — ABNORMAL HIGH (ref 70–99)
Potassium: 5.2 mmol/L — ABNORMAL HIGH (ref 3.5–5.1)
Sodium: 136 mmol/L (ref 135–145)

## 2020-05-04 LAB — LACTIC ACID, PLASMA: Lactic Acid, Venous: 1.8 mmol/L (ref 0.5–1.9)

## 2020-05-04 LAB — APTT: aPTT: 33 seconds (ref 24–36)

## 2020-05-04 LAB — PROTIME-INR
INR: 1.2 (ref 0.8–1.2)
Prothrombin Time: 15 seconds (ref 11.4–15.2)

## 2020-05-04 IMAGING — CR DG FOOT COMPLETE 3+V*R*
1 series · 4 of 4 positions shown · non-contrast
Comparison: None

CLINICAL DATA: Infection fourth toe RIGHT foot

EXAM:
RIGHT FOOT COMPLETE - 3+ VIEW

[Series 1: x foot ap right · 0.14mm/px · 4 of 4 slices shown]
[im 1/4]
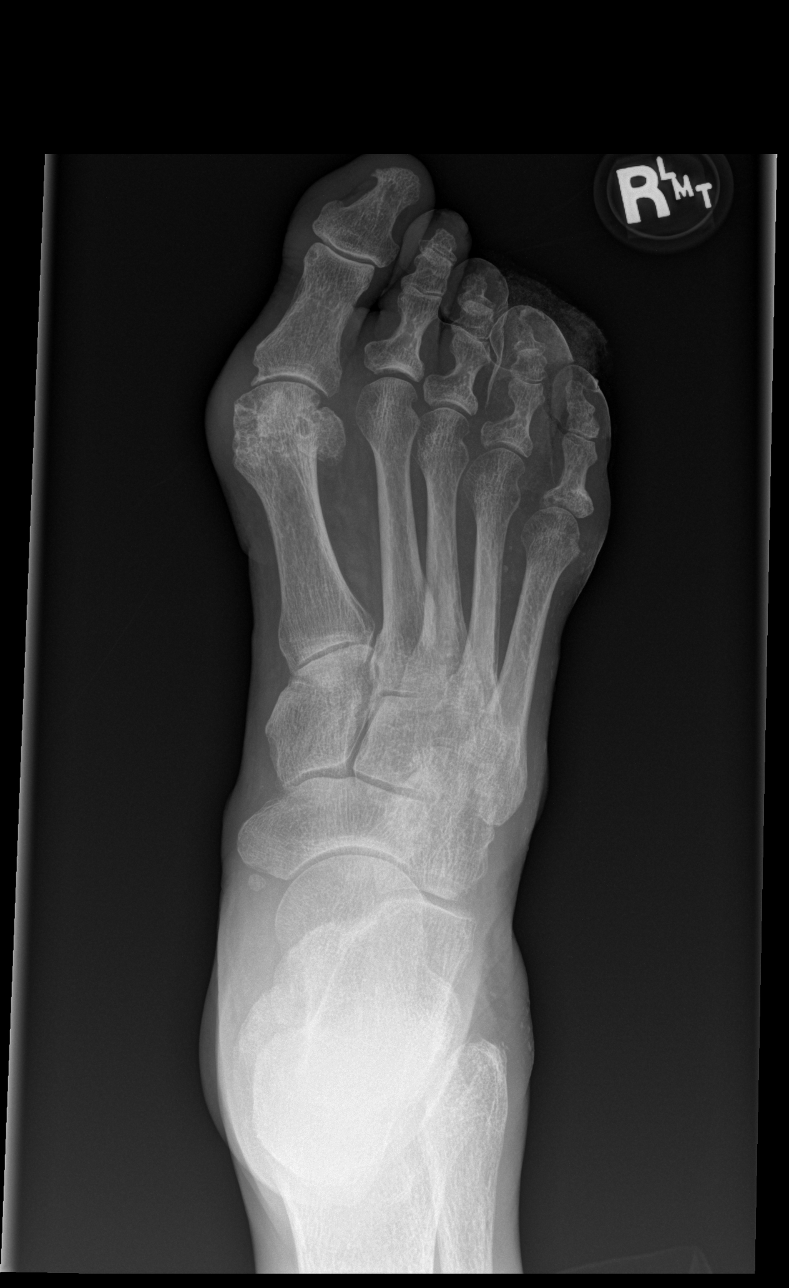
[im 2/4]
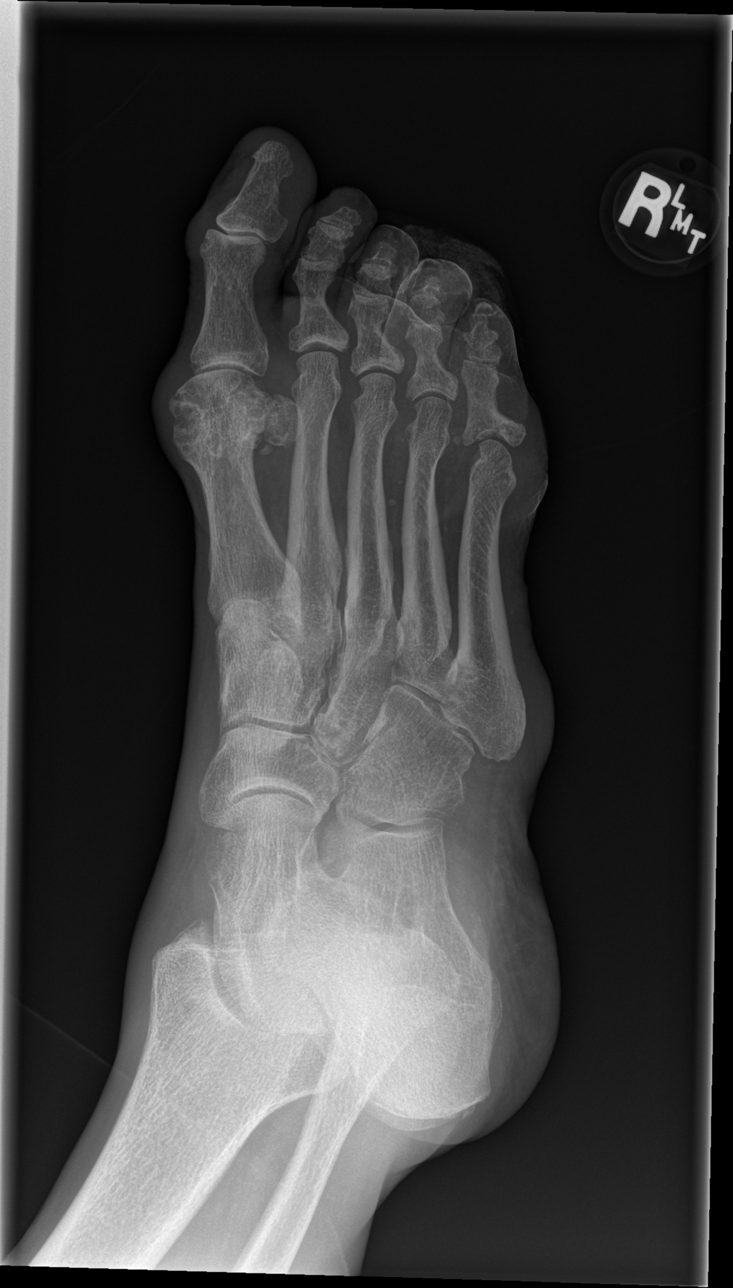
[im 3/4]
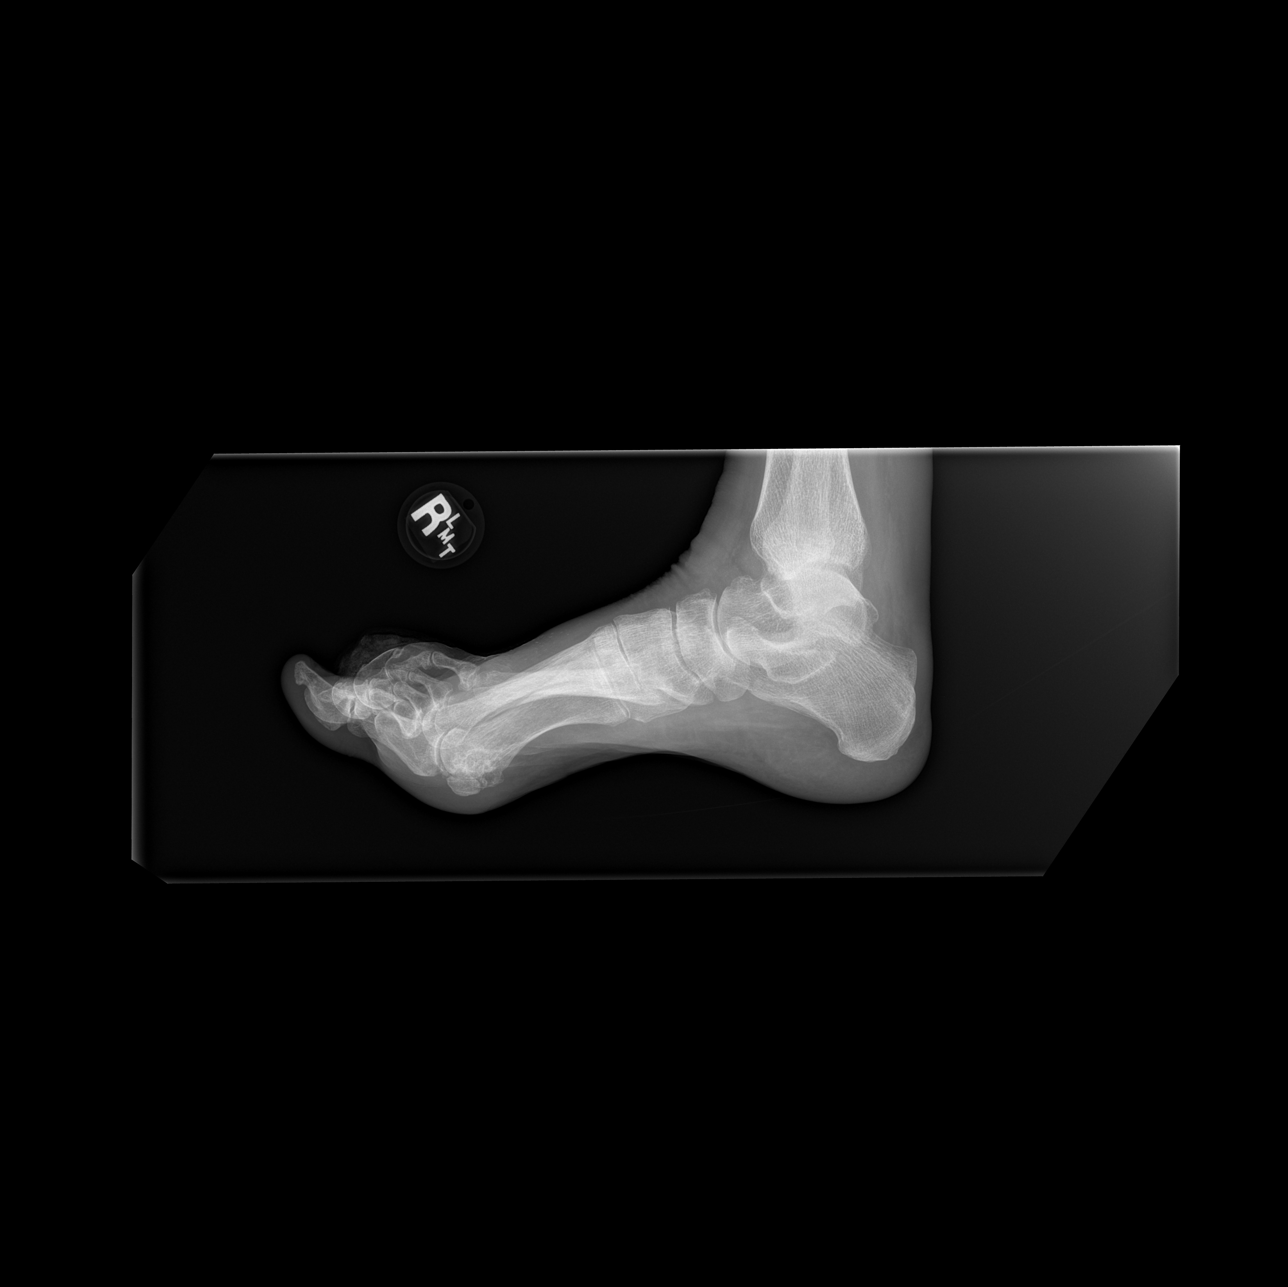
[im 4/4]
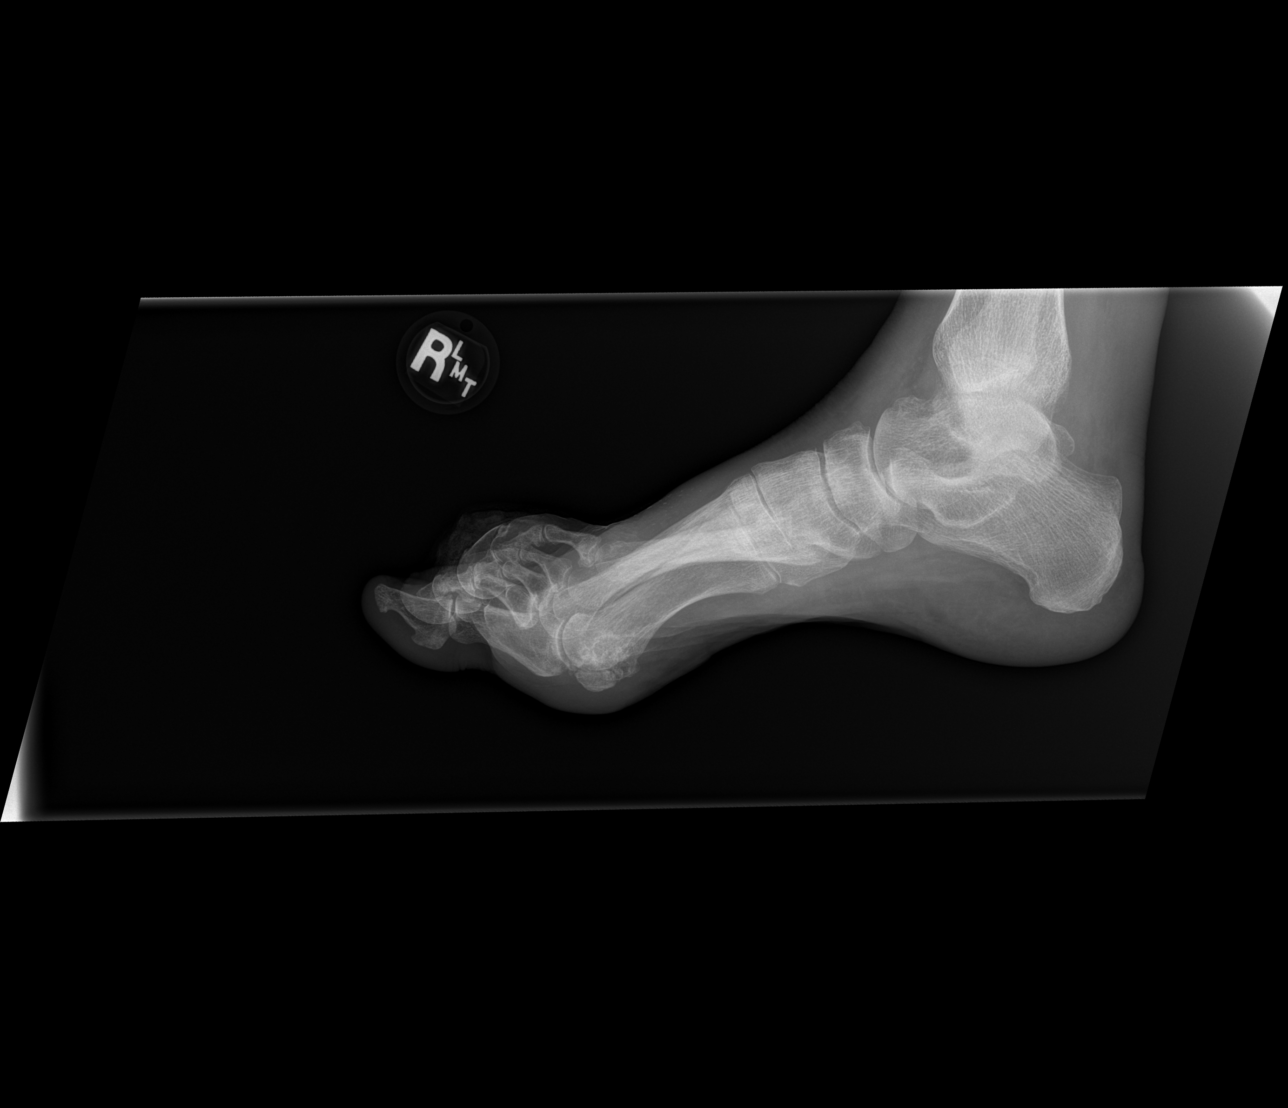

[4 of 4 positions shown; findings below may reference images not displayed]

FINDINGS: Soft tissue swelling of the toes greatest at fourth toe.

Osseous demineralization.

Degenerative changes at first MTP joint with hallux valgus and
bunion deformity.

No acute fracture or dislocation.

Deformity at base of proximal phalanx fifth toe likely sequela of
remote trauma.
IMPRESSION: No definite acute osseous abnormalities.

Hallux valgus with bunion deformity and associated degenerative
changes first MTP joint.

Probable posttraumatic deformity at base of proximal phalanx fifth
toe.

## 2020-05-04 MED ORDER — FAMOTIDINE 20 MG PO TABS
40.0000 mg | ORAL_TABLET | Freq: Two times a day (BID) | ORAL | Status: DC
  Administered 2020-05-04 – 2020-05-09 (×10): 40 mg via ORAL
  Filled 2020-05-04 (×10): qty 2

## 2020-05-04 MED ORDER — SODIUM CHLORIDE 0.9 % IV SOLN
2.0000 g | INTRAVENOUS | Status: DC
  Administered 2020-05-04 – 2020-05-08 (×5): 2 g via INTRAVENOUS
  Filled 2020-05-04 (×2): qty 20
  Filled 2020-05-04: qty 2
  Filled 2020-05-04: qty 20
  Filled 2020-05-04: qty 2
  Filled 2020-05-04: qty 20

## 2020-05-04 MED ORDER — ONDANSETRON HCL 4 MG/2ML IJ SOLN: 4.0000 mg | Freq: Four times a day (QID) | INTRAMUSCULAR | Status: DC | PRN

## 2020-05-04 MED ORDER — ALPRAZOLAM 0.25 MG PO TABS
0.2500 mg | ORAL_TABLET | Freq: Every day | ORAL | Status: DC | PRN
  Administered 2020-05-08: 0.25 mg via ORAL
  Filled 2020-05-04: qty 1

## 2020-05-04 MED ORDER — METOPROLOL SUCCINATE ER 50 MG PO TB24
50.0000 mg | ORAL_TABLET | Freq: Every day | ORAL | Status: DC
  Administered 2020-05-05 – 2020-05-09 (×5): 50 mg via ORAL
  Filled 2020-05-04 (×6): qty 1

## 2020-05-04 MED ORDER — LACTATED RINGERS IV SOLN: INTRAVENOUS | Status: AC

## 2020-05-04 MED ORDER — GABAPENTIN 100 MG PO CAPS
100.0000 mg | ORAL_CAPSULE | Freq: Every day | ORAL | Status: DC
  Administered 2020-05-05 – 2020-05-09 (×5): 100 mg via ORAL
  Filled 2020-05-04 (×5): qty 1

## 2020-05-04 MED ORDER — AMLODIPINE BESYLATE 5 MG PO TABS
5.0000 mg | ORAL_TABLET | Freq: Every day | ORAL | Status: DC
  Administered 2020-05-05 – 2020-05-09 (×5): 5 mg via ORAL
  Filled 2020-05-04 (×5): qty 1

## 2020-05-04 MED ORDER — LEVOTHYROXINE SODIUM 50 MCG PO TABS
100.0000 ug | ORAL_TABLET | Freq: Every day | ORAL | Status: DC
  Administered 2020-05-05 – 2020-05-09 (×3): 100 ug via ORAL
  Filled 2020-05-04 (×5): qty 2

## 2020-05-04 MED ORDER — DULOXETINE HCL 30 MG PO CPEP
30.0000 mg | ORAL_CAPSULE | Freq: Every day | ORAL | Status: DC
  Administered 2020-05-05 – 2020-05-09 (×5): 30 mg via ORAL
  Filled 2020-05-04 (×5): qty 1

## 2020-05-04 MED ORDER — ROSUVASTATIN CALCIUM 10 MG PO TABS
20.0000 mg | ORAL_TABLET | ORAL | Status: DC
  Administered 2020-05-05 – 2020-05-09 (×4): 20 mg via ORAL
  Filled 2020-05-04: qty 1
  Filled 2020-05-04 (×4): qty 2

## 2020-05-04 MED ORDER — HYDRALAZINE HCL 20 MG/ML IJ SOLN: 5.0000 mg | Freq: Four times a day (QID) | INTRAMUSCULAR | Status: DC | PRN

## 2020-05-04 MED ORDER — ENOXAPARIN SODIUM 40 MG/0.4ML IJ SOSY
40.0000 mg | PREFILLED_SYRINGE | INTRAMUSCULAR | Status: DC
  Administered 2020-05-04 – 2020-05-08 (×4): 40 mg via SUBCUTANEOUS
  Filled 2020-05-04 (×4): qty 0.4

## 2020-05-04 MED ORDER — ACETAMINOPHEN 325 MG PO TABS
650.0000 mg | ORAL_TABLET | Freq: Four times a day (QID) | ORAL | Status: DC | PRN
  Administered 2020-05-08: 650 mg via ORAL
  Filled 2020-05-04: qty 2

## 2020-05-04 MED ORDER — INSULIN ASPART 100 UNIT/ML IJ SOLN: 0.0000 [IU] | Freq: Every day | INTRAMUSCULAR | Status: DC

## 2020-05-04 MED ORDER — SODIUM ZIRCONIUM CYCLOSILICATE 10 G PO PACK
10.0000 g | PACK | Freq: Once | ORAL | Status: AC
  Administered 2020-05-04: 10 g via ORAL
  Filled 2020-05-04: qty 1

## 2020-05-04 MED ORDER — INSULIN ASPART 100 UNIT/ML IJ SOLN
0.0000 [IU] | Freq: Three times a day (TID) | INTRAMUSCULAR | Status: DC
  Administered 2020-05-05: 5 [IU] via SUBCUTANEOUS
  Administered 2020-05-05: 3 [IU] via SUBCUTANEOUS
  Administered 2020-05-06: 7 [IU] via SUBCUTANEOUS
  Administered 2020-05-06: 1 [IU] via SUBCUTANEOUS
  Administered 2020-05-06 – 2020-05-07 (×2): 2 [IU] via SUBCUTANEOUS
  Administered 2020-05-07: 1 [IU] via SUBCUTANEOUS
  Administered 2020-05-08: 3 [IU] via SUBCUTANEOUS
  Administered 2020-05-08: 5 [IU] via SUBCUTANEOUS
  Administered 2020-05-09: 7 [IU] via SUBCUTANEOUS
  Administered 2020-05-09: 2 [IU] via SUBCUTANEOUS
  Filled 2020-05-04 (×11): qty 1

## 2020-05-04 MED ORDER — IRBESARTAN 75 MG PO TABS: 37.5000 mg | ORAL_TABLET | Freq: Every day | ORAL | Status: DC

## 2020-05-04 MED ORDER — PANTOPRAZOLE SODIUM 40 MG PO TBEC
40.0000 mg | DELAYED_RELEASE_TABLET | ORAL | Status: DC
  Administered 2020-05-05 – 2020-05-09 (×3): 40 mg via ORAL
  Filled 2020-05-04 (×4): qty 1

## 2020-05-04 MED ORDER — MELATONIN 5 MG PO TABS
2.5000 mg | ORAL_TABLET | Freq: Every evening | ORAL | Status: DC | PRN
  Administered 2020-05-05 – 2020-05-07 (×4): 2.5 mg via ORAL
  Filled 2020-05-04 (×4): qty 1

## 2020-05-04 NOTE — ED Notes (Signed)
Lt green, lav, grey, blue, red, and 1st set blood cultures sent to lab.

## 2020-05-04 NOTE — ED Provider Notes (Signed)
[facility] Emergency Department Provider Note   ____________________________________________    I have reviewed the triage vital signs and the nursing notes.   HISTORY  Chief Complaint Toe Pain     HPI Laymond PurserVeronica S Ozga is a 75 y.o. female who presents with complaints of gangrene to the right fourth toe.  Patient has a history of diabetes, has been working with Dr. Excell SeltzerBaker of podiatry for some time for chronic wound to the right fourth toe, unfortunately has worsened and is now gangrenous.  Sent in by Dr. Excell SeltzerBaker for admission and amputation.  Patient has no fevers or chills, denies significant pain.  No rash  Past Medical History:  Diagnosis Date  . Anemia   . Anxiety   . Arthritis    Gout  . Cataracts, both eyes   . Diabetic retinopathy (HCC)    NPDR OU  . Diabetic retinopathy (HCC)   . GERD (gastroesophageal reflux disease)   . Gout   . Headache    h/o migraines  . History of fracture of patella    right knee  . History of positive PPD    Patient always shows positive  . Hyperlipidemia   . Hypertension   . Hypertensive retinopathy    OU  . Hypothyroidism   . Lichen sclerosus 12/30/2013   of vulva  . Metatarsal fracture   . Neuropathy   . Peripheral vascular disease (HCC)   . Polyneuropathy    numbness and tingling in feet and toes  . Renal insufficiency    Stage 3  . Sleep apnea    does not use cpap-lost weight   . Type 2 diabetes mellitus, uncontrolled (HCC)     Patient Active Problem List   Diagnosis Date Noted  . Gangrene of toe of right foot (HCC) 05/04/2020  . History of 2019 novel coronavirus disease (COVID-19) 02/20/2019  . Anemia of chronic kidney failure, stage 3 (moderate) (HCC) 02/05/2019  . Normocytic anemia 02/01/2019  . Osteoporosis 11/15/2018  . Atherosclerosis of native arteries of extremity with rest pain (HCC) 09/25/2018  . 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 Wound disruption, post-op, skin,  subsequent encounter 04/09/2018  . Postoperative wound infection 03/21/2018  . Atherosclerosis of artery of extremity with intermittent claudication (HCC) 03/08/2018  . Atherosclerosis of native arteries of the extremities with ulceration (HCC) 10/13/2017  . DM type 2 with diabetic peripheral neuropathy (HCC) 10/12/2017  . History of hip fracture 10/12/2017  . Type 2 diabetes mellitus with vascular disease (HCC) 10/12/2017  . Carotid atherosclerosis, bilateral 07/11/2017  . Medicare annual wellness visit, initial 05/03/2017  . Peripheral vascular disease, unspecified (HCC) 03/01/2017  . Diabetic ulcer of toe of left foot associated with type 2 diabetes mellitus (HCC) 03/01/2017  . Chronic painful diabetic neuropathy (HCC) 06/02/2016  . 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  . Hip fracture (HCC) 10/30/2015  . Greater trochanter fracture (HCC) 10/29/2015  . Acquired hypothyroidism 10/16/2015  . Stage 3b chronic kidney disease (HCC) 10/16/2015  . Hyperlipidemia, mixed 04/16/2015  . Benign essential hypertension 04/16/2015  . Closed compression fracture of lumbar vertebra (HCC) 04/16/2015  . Iron deficiency anemia 09/22/2014  . Disease of thyroid gland 09/22/2014  . Diabetes mellitus type 2, uncontrolled (HCC) 05/13/2014  . Lichen sclerosus 12/30/2013  . History of positive PPD 10/08/2013    Past Surgical History:  Procedure Laterality Date  . ABDOMINAL HYSTERECTOMY    . APPENDECTOMY    . BREAST REDUCTION SURGERY  2001  . CATARACT  EXTRACTION    . CESAREAN SECTION  1976  . COLONOSCOPY  03/05/2013   Nml - due for repeat 03/06/2018  . COLONOSCOPY WITH PROPOFOL N/A 03/18/2019   Procedure: COLONOSCOPY WITH PROPOFOL;  Surgeon: Toledo, [name], MD;  Location: [facility];  Service: Gastroenterology;  Laterality: N/A;  . DIAGNOSTIC LAPAROSCOPY    . DILATION AND CURETTAGE OF UTERUS  1989  . ENDARTERECTOMY FEMORAL Bilateral 03/09/2018   Procedure: ENDARTERECTOMY FEMORAL;   Surgeon: Annice Needy, MD;  Location: [facility];  Service: Vascular;  Laterality: Bilateral;  . ENDARTERECTOMY POPLITEAL Left 03/09/2018   Procedure: ENDARTERECTOMY POPLITEAL AND SFA;  Surgeon: Annice Needy, MD;  Location: [facility];  Service: Vascular;  Laterality: Left;  . ESOPHAGOGASTRODUODENOSCOPY  03/05/2013  . ESOPHAGOGASTRODUODENOSCOPY (EGD) WITH PROPOFOL N/A 03/18/2019   Procedure: ESOPHAGOGASTRODUODENOSCOPY (EGD) WITH PROPOFOL;  Surgeon: Toledo, [name], MD;  Location: [facility];  Service: Gastroenterology;  Laterality: N/A;  . EYE SURGERY    . Eyelid Surgery  2012  . INTRAMEDULLARY (IM) NAIL INTERTROCHANTERIC Left 10/30/2015   Procedure: INTRAMEDULLARY (IM) NAIL INTERTROCHANTRIC ;  Surgeon: Kennedy Bucker, MD;  Location: [facility];  Service: Orthopedics;  Laterality: Left;  . KYPHOPLASTY N/A 10/25/2018   Procedure: L4 KYPHOPLASTY;  Surgeon: Kennedy Bucker, MD;  Location: [facility];  Service: Orthopedics;  Laterality: N/A;  . LAPAROSCOPIC HYSTERECTOMY  2000   total  . LOWER EXTREMITY ANGIOGRAPHY Left 03/08/2017   Procedure: LOWER EXTREMITY ANGIOGRAPHY;  Surgeon: Annice Needy, MD;  Location: [facility];  Service: Cardiovascular;  Laterality: Left;  . LOWER EXTREMITY ANGIOGRAPHY Left 10/30/2017   Procedure: LOWER EXTREMITY ANGIOGRAPHY;  Surgeon: Annice Needy, MD;  Location: [facility];  Service: Cardiovascular;  Laterality: Left;  . LOWER EXTREMITY ANGIOGRAPHY Right 03/08/2018   Procedure: LOWER EXTREMITY ANGIOGRAPHY;  Surgeon: Annice Needy, MD;  Location: [facility];  Service: Cardiovascular;  Laterality: Right;  . LOWER EXTREMITY ANGIOGRAPHY Left 10/01/2018   Procedure: LOWER EXTREMITY ANGIOGRAPHY;  Surgeon: Annice Needy, MD;  Location: [facility];  Service: Cardiovascular;  Laterality: Left;  . LOWER EXTREMITY ANGIOGRAPHY Right 10/08/2018   Procedure: LOWER EXTREMITY ANGIOGRAPHY;  Surgeon: Annice Needy, MD;  Location: [facility];  Service:  Cardiovascular;  Laterality: Right;  . PERIPHERAL VASCULAR INTERVENTION  03/08/2018   Procedure: PERIPHERAL VASCULAR INTERVENTION;  Surgeon: Annice Needy, MD;  Location: [facility];  Service: Cardiovascular;;  . PORTA CATH INSERTION N/A 02/17/2020   Procedure: PORTA CATH INSERTION;  Surgeon: Annice Needy, MD;  Location: [facility];  Service: Cardiovascular;  Laterality: N/A;  . REDUCTION MAMMAPLASTY  1997  . SACROPLASTY N/A 10/25/2018   Procedure: S1 SACROPLASTY;  Surgeon: Kennedy Bucker, MD;  Location: [facility];  Service: Orthopedics;  Laterality: N/A;    Prior to Admission medications   Medication Sig Start Date End Date Taking? Authorizing Provider  ALPRAZolam Prudy Feeler) 0.25 MG tablet Take 0.25 mg by mouth daily as needed for anxiety or sleep.  05/07/18   [provider]  aspirin EC 81 MG tablet Take 81 mg by mouth daily.     [provider]  cholecalciferol (VITAMIN D) 1000 units tablet Take 1,000 Units by mouth 2 (two) times daily.    [provider]  CORAL CALCIUM PO Take 1 tablet by mouth 2 (two) times daily.     [provider]  denosumab (PROLIA) 60 MG/ML SOLN injection Inject 60 mg into the skin every  6 (six) months.  03/15/17   [provider]  DULoxetine (CYMBALTA) 30 MG capsule Take 30 mg by mouth daily. 01/02/20   [provider]  estradiol (ESTRACE) 0.1 MG/GM vaginal cream Apply one pea-sized amount around the opening of the urethra daily for two weeks, then three times weekly thereafter. 04/27/20   Vaillancourt, Lelon Mast, PA-C  famotidine (PEPCID) 40 MG tablet Take 40 mg by mouth 2 (two) times daily.  09/30/18   [provider]  furosemide (LASIX) 20 MG tablet Take 20 mg by mouth daily as needed for fluid or edema.  05/07/18 09/04/26  [provider]  gabapentin (NEURONTIN) 100 MG capsule Take 1 capsule by mouth daily. 03/04/19   [provider]  Insulin Pen Needle 31G X 5 MM MISC once daily.  07/04/16   [provider]  levothyroxine (SYNTHROID, LEVOTHROID) 100 MCG tablet Take 100 mcg by mouth daily before breakfast.  08/03/14   [provider]  lidocaine-prilocaine (EMLA) cream Apply 1 application topically as needed (apply prior to port a cath access). 04/08/20   Earna Coder, MD  Magnesium 500 MG TABS Take 500 mg by mouth every morning.     [provider]  metFORMIN (GLUCOPHAGE) 1000 MG tablet Take 1 tablet by mouth 2 (two) times daily with a meal. 01/17/20   [provider]  metoprolol succinate (TOPROL-XL) 50 MG 24 hr tablet Take by mouth. 12/04/18 02/05/20  [provider]  mirabegron ER (MYRBETRIQ) 50 MG TB24 tablet Take 1 tablet (50 mg total) by mouth daily. 04/27/20   Vaillancourt, Lelon Mast, PA-C  mirtazapine (REMERON) 15 MG tablet Take by mouth. 02/20/19 02/20/20  [provider]  Multiple Vitamin (MULTIVITAMIN WITH MINERALS) TABS tablet Take 1 tablet by mouth daily.    [provider]  mupirocin cream (BACTROBAN) 2 % Apply topically 2 (two) times daily. 03/17/20   [provider]  nitrofurantoin (MACRODANTIN) 100 MG capsule Take by mouth. 03/24/20   [provider]  nystatin cream (MYCOSTATIN) Apply 1 application topically 2 (two) times daily. 04/07/20 04/07/21  [provider]  olmesartan (BENICAR) 20 MG tablet Take 20 mg by mouth daily.    [provider]  pantoprazole (PROTONIX) 40 MG tablet Take 40 mg by mouth every morning.     [provider]  rivaroxaban (XARELTO) 20 MG TABS tablet Take 1 tablet (20 mg total) by mouth daily with supper. 12/31/19   Georgiana Spinner, NP  rosuvastatin (CRESTOR) 20 MG tablet Take 20 mg by mouth every morning.  03/19/18 09/04/26  [provider]  TRESIBA FLEXTOUCH 200 UNIT/ML SOPN Inject 30 Units as directed at bedtime.  11/15/16   [provider]  vitamin E 400 UNIT capsule Take 400 Units by mouth daily.    [provider]  zolpidem (AMBIEN) 5 MG tablet Take 1 tablet (5 mg total) by mouth at bedtime. Patient taking differently: Take 5 mg by mouth at bedtime as needed. 03/12/18   [name], Cala Bradford A, PA-C     Allergies Ace inhibitors  Family History  Problem Relation Age of Onset  . Coronary artery disease Father   . Heart attack Father   . Coronary artery disease Mother   . Heart attack Mother   . Ovarian cancer Sister 15       sister had hormonal therapy for IVF txs-which increased risk factor for ovarian cancer  . Breast cancer Neg Hx     Social History Social History   Tobacco  Use  . Smoking status: Former Smoker    Packs/day: 1.00    Years: 20.00    Pack years: 20.00    Types: Cigarettes    Quit date: 03/07/1996    Years since quitting: 24.1  . Smokeless tobacco: Never Used  . Tobacco comment: started smoking at age 21  Vaping Use  . Vaping Use: Never used  Substance Use Topics  . Alcohol use: No    Alcohol/week: 0.0 standard drinks  . Drug use: No    Review of Systems  Constitutional: No fever/chills Eyes: No visual changes.  ENT: No sore throat. Cardiovascular: Denies chest pain. Respiratory: Denies shortness of breath. Gastrointestinal: No abdominal pain.  Genitourinary: Negative for dysuria. Musculoskeletal: As above Skin: Negative for rash. Neurological: Negative for headaches or weakness   ____________________________________________   PHYSICAL EXAM:  VITAL SIGNS: ED Triage Vitals  Enc Vitals Group     BP 05/04/20 1550 (!) 145/76     Pulse Rate 05/04/20 1550 81     Resp 05/04/20 1550 18     Temp 05/04/20 1550 98.5 F (36.9 C)     Temp Source 05/04/20 1550 Oral     SpO2 05/04/20 1550 97 %     Weight 05/04/20 1548 72.6 kg (160 lb)     Height 05/04/20 1548 1.651 m ( )     Head Circumference --      Peak Flow --      Pain Score 05/04/20 1548 0     Pain Loc --      Pain Edu? --      Excl. in GC? --     Constitutional: Alert and oriented.   Nose:  No congestion/rhinnorhea. Mouth/Throat: Mucous membranes are moist.   Neck:  Painless ROM Cardiovascular: Normal rate, regular rhythm.  Good peripheral circulation. Respiratory: Normal respiratory effort.  No retractions. Gastrointestinal: Soft and nontender. No distention.  Musculoskeletal: No lower extremity tenderness nor edema.  Warm and well perfused.  Right fourth toe, dry gangrene Neurologic:  Normal speech and language. No gross focal neurologic deficits are appreciated.  Skin:  Skin is warm, dry Psychiatric: Mood and affect are normal. Speech and behavior are normal.  ____________________________________________   LABS (all labs ordered are listed, but only abnormal results are displayed)  Labs Reviewed  CBC WITH DIFFERENTIAL/PLATELET - Abnormal; Notable for the following components:      Result Value   RBC 2.92 (*)    Hemoglobin 8.2 (*)    HCT 26.0 (*)    RDW 18.2 (*)    nRBC 0.4 (*)    All other components within normal limits  BASIC METABOLIC PANEL - Abnormal; Notable for the following components:   Potassium 5.2 (*)    CO2 21 (*)    Glucose, Bld 196 (*)    BUN 33 (*)    Creatinine, Ser 1.32 (*)    GFR, Estimated 42 (*)    All other components within normal limits  SARS CORONAVIRUS 2 (TAT 6-24 HRS)  PROTIME-INR  APTT  LACTIC ACID, PLASMA   ____________________________________________  EKG  None ____________________________________________  RADIOLOGY  X-ray foot, no acute abnormalities ____________________________________________   PROCEDURES  Procedure(s) performed: No  Procedures   Critical Care performed: No ____________________________________________   INITIAL IMPRESSION / ASSESSMENT AND PLAN / ED COURSE  Pertinent labs & imaging results that were available during my care of the patient were reviewed by me and considered in my medical decision making (see chart for details).  Patient  overall well-appearing in no acute distress, vital  signs reassuring, lab work unremarkable.  I discussed with the hospitalist for admission    ____________________________________________   FINAL CLINICAL IMPRESSION(S) / ED DIAGNOSES  Final diagnoses:  Gangrene of toe (HCC)        Note:  This document was prepared using Dragon voice recognition software and may include unintentional dictation errors.   Jene Every, MD 05/04/20 [phone]

## 2020-05-04 NOTE — ED Triage Notes (Signed)
Pt to ED via POV with c/o infection to 4th digit of R foot. Pt states sent by Podiatrist Dr. Excell Seltzer for amputation of 4th digit. Pt states known gangrene to her toe.

## 2020-05-04 NOTE — H&P (Signed)
History and Physical  Cheryl Leonard ZOX:[id number] DOB: 29-Jun-1945 DOA: 05/04/2020  Referring physician: Dr. Cyril Loosen, EDP PCP: Danella Penton, MD  Outpatient Specialists: Podiatry, oncology, vascular surgery. Patient coming from: From podiatry's office Dr. Excell Seltzer through Home.  Chief Complaint: Right 4th toe turned blue, noted this morning.  HPI: Cheryl Leonard is a 75 y.o. female with medical history significant for peripheral artery disease on Xarelto followed by vascular surgery, right fourth toe injury followed by podiatry for weeks, essential hypertension, type 2 diabetes, CKD 3B, chronic normocytic anemia, who presented to [facility] ED, sent to the ED by her podiatrist Dr. Excell Seltzer due to concern for early gangrene affecting right fourth toe.  Patient reports that initially had an injury to that toe.  Has been on p.o. antibiotics Keflex for the past 7 days with some improvement however this morning after waking up she noted that her right fourth toe was bluish in appearance.  No pain due to her polyneuropathy.  She presented to her podiatrist this morning who recommended that she come to the ED for possible amputation of her toe.  EDP contacted podiatry Dr. Excell Seltzer who recommended admission.  Per Dr. Excell Seltzer patient will also be seen by vascular surgery in consultation.  TRH, hospitalist team, was asked to admit.  ED Course:  Afebrile.  BP 145/76, pulse 81, respiratory rate 18, O2 saturation 97% on room air.  Lab studies remarkable for serum potassium 5.2, serum bicarb 21, serum glucose 196, BUN 33, creatinine 1.32, GFR 42.  Review of Systems: Review of systems as noted in the HPI. All other systems reviewed and are negative.   Past Medical History:  Diagnosis Date  . Anemia   . Anxiety   . Arthritis    Gout  . Cataracts, both eyes   . Diabetic retinopathy (HCC)    NPDR OU  . Diabetic retinopathy (HCC)   . GERD (gastroesophageal reflux disease)   . Gout   . Headache    h/o migraines   . History of fracture of patella    right knee  . History of positive PPD    Patient always shows positive  . Hyperlipidemia   . Hypertension   . Hypertensive retinopathy    OU  . Hypothyroidism   . Lichen sclerosus 12/30/2013   of vulva  . Metatarsal fracture   . Neuropathy   . Peripheral vascular disease (HCC)   . Polyneuropathy    numbness and tingling in feet and toes  . Renal insufficiency    Stage 3  . Sleep apnea    does not use cpap-lost weight   . Type 2 diabetes mellitus, uncontrolled (HCC)    Past Surgical History:  Procedure Laterality Date  . ABDOMINAL HYSTERECTOMY    . APPENDECTOMY    . BREAST REDUCTION SURGERY  2001  . CATARACT EXTRACTION    . CESAREAN SECTION  1976  . COLONOSCOPY  03/05/2013   Nml - due for repeat 03/06/2018  . COLONOSCOPY WITH PROPOFOL N/A 03/18/2019   Procedure: COLONOSCOPY WITH PROPOFOL;  Surgeon: Toledo, [name], MD;  Location: [facility];  Service: Gastroenterology;  Laterality: N/A;  . DIAGNOSTIC LAPAROSCOPY    . DILATION AND CURETTAGE OF UTERUS  1989  . ENDARTERECTOMY FEMORAL Bilateral 03/09/2018   Procedure: ENDARTERECTOMY FEMORAL;  Surgeon: Annice Needy, MD;  Location: [facility] ORS;  Service: Vascular;  Laterality: Bilateral;  . ENDARTERECTOMY POPLITEAL Left 03/09/2018   Procedure: ENDARTERECTOMY POPLITEAL AND SFA;  Surgeon: Annice Needy, MD;  Location: [facility] ORS;  Service: Vascular;  Laterality: Left;  . ESOPHAGOGASTRODUODENOSCOPY  03/05/2013  . ESOPHAGOGASTRODUODENOSCOPY (EGD) WITH PROPOFOL N/A 03/18/2019   Procedure: ESOPHAGOGASTRODUODENOSCOPY (EGD) WITH PROPOFOL;  Surgeon: Toledo, [name], MD;  Location: [facility];  Service: Gastroenterology;  Laterality: N/A;  . EYE SURGERY    . Eyelid Surgery  2012  . INTRAMEDULLARY (IM) NAIL INTERTROCHANTERIC Left 10/30/2015   Procedure: INTRAMEDULLARY (IM) NAIL INTERTROCHANTRIC ;  Surgeon: Kennedy Bucker, MD;  Location: [facility] OR[facility];  Service: Orthopedics;  Laterality: Left;  . KYPHOPLASTY N/A  10/25/2018   Procedure: L4 KYPHOPLASTY;  Surgeon: Kennedy Bucker, MD;  Location: [facility];  Service: Orthopedics;  Laterality: N/A;  . LAPAROSCOPIC HYSTERECTOMY  2000   total  . LOWER EXTREMITY ANGIOGRAPHY Left 03/08/2017   Procedure: LOWER EXTREMITY ANGIOGRAPHY;  Surgeon: Annice Needy, MD;  Location: [facility];  Service: Cardiovascular;  Laterality: Left;  . LOWER EXTREMITY ANGIOGRAPHY Left 10/30/2017   Procedure: LOWER EXTREMITY ANGIOGRAPHY;  Surgeon: Annice Needy, MD;  Location: [facility];  Service: Cardiovascular;  Laterality: Left;  . LOWER EXTREMITY ANGIOGRAPHY Right 03/08/2018   Procedure: LOWER EXTREMITY ANGIOGRAPHY;  Surgeon: Annice Needy, MD;  Location: [facility];  Service: Cardiovascular;  Laterality: Right;  . LOWER EXTREMITY ANGIOGRAPHY Left 10/01/2018   Procedure: LOWER EXTREMITY ANGIOGRAPHY;  Surgeon: Annice Needy, MD;  Location: [facility];  Service: Cardiovascular;  Laterality: Left;  . LOWER EXTREMITY ANGIOGRAPHY Right 10/08/2018   Procedure: LOWER EXTREMITY ANGIOGRAPHY;  Surgeon: Annice Needy, MD;  Location: [facility];  Service: Cardiovascular;  Laterality: Right;  . PERIPHERAL VASCULAR INTERVENTION  03/08/2018   Procedure: PERIPHERAL VASCULAR INTERVENTION;  Surgeon: Annice Needy, MD;  Location: [facility];  Service: Cardiovascular;;  . PORTA CATH INSERTION N/A 02/17/2020   Procedure: PORTA CATH INSERTION;  Surgeon: Annice Needy, MD;  Location: [facility];  Service: Cardiovascular;  Laterality: N/A;  . REDUCTION MAMMAPLASTY  1997  . SACROPLASTY N/A 10/25/2018   Procedure: S1 SACROPLASTY;  Surgeon: Kennedy Bucker, MD;  Location: [facility];  Service: Orthopedics;  Laterality: N/A;    Social History:  reports that she quit smoking about 24 years ago. Her smoking use included cigarettes. She has a 20.00 pack-year smoking history. She has never used smokeless tobacco. She reports that she does not drink alcohol and  does not use drugs.   Allergies  Allergen Reactions  . Ace Inhibitors Other (See Comments)    Family History  Problem Relation Age of Onset  . Coronary artery disease Father   . Heart attack Father   . Coronary artery disease Mother   . Heart attack Mother   . Ovarian cancer Sister 51       sister had hormonal therapy for IVF txs-which increased risk factor for ovarian cancer  . Breast cancer Neg Hx      Prior to Admission medications   Medication Sig Start Date End Date Taking? Authorizing Provider  ALPRAZolam Prudy Feeler) 0.25 MG tablet Take 0.25 mg by mouth daily as needed for anxiety or sleep.  05/07/18   [provider]  aspirin EC 81 MG tablet Take 81 mg by mouth daily.     [provider]  cholecalciferol (VITAMIN D) 1000 units tablet Take 1,000 Units by mouth 2 (two) times daily.    [provider]  CORAL CALCIUM PO Take 1 tablet by mouth 2 (two) times daily.     [provider]  denosumab (PROLIA) 60 MG/ML SOLN injection Inject 60 mg into the skin every 6 (six) months.  03/15/17   [provider]  DULoxetine (CYMBALTA) 30 MG capsule Take 30 mg by mouth daily. 01/02/20   [provider]  estradiol (ESTRACE) 0.1 MG/GM vaginal cream Apply one pea-sized amount around the opening of the urethra daily for two weeks, then three times weekly thereafter. 04/27/20   Vaillancourt, Lelon MastSamantha, PA-C  famotidine (PEPCID) 40 MG tablet Take 40 mg by mouth 2 (two) times daily.  09/30/18   [provider]  furosemide (LASIX) 20 MG tablet Take 20 mg by mouth daily as needed for fluid or edema.  05/07/18 09/04/26  [provider]  gabapentin (NEURONTIN) 100 MG capsule Take 1 capsule by mouth daily. 03/04/19   [provider]  Insulin Pen Needle 31G X 5 MM MISC once daily. 07/04/16   [provider]  levothyroxine (SYNTHROID, LEVOTHROID) 100 MCG tablet Take 100 mcg by mouth daily before breakfast.  08/03/14   [provider]  lidocaine-prilocaine (EMLA) cream Apply 1 application topically as needed (apply prior to port a cath access). 04/08/20   Earna CoderBrahmanday, Govinda R, MD  Magnesium 500 MG TABS Take 500 mg by mouth every morning.     [provider]  metFORMIN (GLUCOPHAGE) 1000 MG tablet Take 1 tablet by mouth 2 (two) times daily with a meal. 01/17/20   [provider]  metoprolol succinate (TOPROL-XL) 50 MG 24 hr tablet Take by mouth. 12/04/18 02/05/20  [provider]  mirabegron ER (MYRBETRIQ) 50 MG TB24 tablet Take 1 tablet (50 mg total) by mouth daily. 04/27/20   Vaillancourt, Lelon MastSamantha, PA-C  mirtazapine (REMERON) 15 MG tablet Take by mouth. 02/20/19 02/20/20  [provider]  Multiple Vitamin (MULTIVITAMIN WITH MINERALS) TABS tablet Take 1 tablet by mouth daily.    [provider]  mupirocin cream (BACTROBAN) 2 % Apply topically 2 (two) times daily. 03/17/20   [provider]  nitrofurantoin (MACRODANTIN) 100 MG capsule Take by mouth. 03/24/20   [provider]  nystatin cream (MYCOSTATIN) Apply 1 application topically 2 (two) times daily. 04/07/20 04/07/21  [provider]  olmesartan (BENICAR) 20 MG tablet Take 20 mg by mouth daily.    [provider]  pantoprazole (PROTONIX) 40 MG tablet Take 40 mg by mouth every morning.     [provider]  rivaroxaban (XARELTO) 20 MG TABS tablet Take 1 tablet (20 mg total) by mouth daily with supper. 12/31/19   Georgiana SpinnerBrown, Fallon E, NP  rosuvastatin (CRESTOR) 20 MG tablet Take 20 mg by mouth every morning.  03/19/18 09/04/26  [provider]  TRESIBA FLEXTOUCH 200 UNIT/ML SOPN Inject 30 Units as directed at bedtime.  11/15/16   [provider]  vitamin E 400 UNIT capsule Take 400 Units by mouth daily.    [provider]  zolpidem (AMBIEN) 5 MG tablet Take 1 tablet (5 mg total) by mouth at bedtime. Patient taking differently: Take 5 mg by mouth at bedtime as needed.  03/12/18   [name], Ranae PlumberKimberly A, PA-C    Physical Exam: BP (!) 145/76 (BP Location: Right Arm)   Pulse 81   Temp 98.5 F (36.9 C) (Oral)   Resp 18   Ht 5\' 5"  (1.651 m)   Wt 72.6 kg   SpO2 97%   BMI 26.63 kg/m   . General: 75 y.o. year-old female well developed well nourished in no acute distress.  Alert and  oriented x3. . Cardiovascular: Regular rate and rhythm with no rubs or gallops.  No thyromegaly or JVD noted.  No lower extremity edema. 2/4 pulses in all 4 extremities. [name] Respiratory: Clear to auscultation with no wheezes or rales. Good inspiratory effort. . Abdomen: Soft nontender nondistended with normal bowel sounds x4 quadrants. . Muskuloskeletal: No cyanosis, clubbing or edema noted bilaterally . Neuro: CN II-XII intact, strength, sensation, reflexes . Skin: No ulcerative lesions noted or rashes.  Right 4th bluish in appearance. [name] Psychiatry: Judgement and insight appear normal. Mood is appropriate for condition and setting          Labs on Admission:  Basic Metabolic Panel: Recent Labs  Lab 05/04/20 1553  NA 136  K 5.2*  CL 106  CO2 21*  GLUCOSE 196*  BUN 33*  CREATININE 1.32*  CALCIUM 9.5   Liver Function Tests: No results for input(s): AST, ALT, ALKPHOS, BILITOT, PROT, ALBUMIN in the last 168 hours. No results for input(s): LIPASE, AMYLASE in the last 168 hours. No results for input(s): AMMONIA in the last 168 hours. CBC: Recent Labs  Lab 05/04/20 1553  WBC 6.8  NEUTROABS 4.4  HGB 8.2*  HCT 26.0*  MCV 89.0  PLT 326   Cardiac Enzymes: No results for input(s): CKTOTAL, CKMB, CKMBINDEX, TROPONINI in the last 168 hours.  BNP (last 3 results) No results for input(s): BNP in the last 8760 hours.  ProBNP (last 3 results) No results for input(s): PROBNP in the last 8760 hours.  CBG: No results for input(s): GLUCAP in the last 168 hours.  Radiological Exams on Admission: DG Foot Complete Right  Result Date: 05/04/2020 CLINICAL DATA:  Infection  fourth toe RIGHT foot EXAM: RIGHT FOOT COMPLETE - 3+ VIEW COMPARISON:  None FINDINGS: Soft tissue swelling of the toes greatest at fourth toe. Osseous demineralization. Degenerative changes at first MTP joint with hallux valgus and bunion deformity. No acute fracture or dislocation. Deformity at base of proximal phalanx fifth toe likely sequela of remote trauma. IMPRESSION: No definite acute osseous abnormalities. Hallux valgus with bunion deformity and associated degenerative changes first MTP joint. Probable posttraumatic deformity at base of proximal phalanx fifth toe. Electronically Signed   By: Ulyses Southward M.D.   On: 05/04/2020 16:49    EKG: I independently viewed the EKG done and my findings are as followed: None available at the time of this evaluation.  Assessment/Plan Present on Admission: . Gangrene of toe of right foot (HCC)  Active Problems:   Gangrene of toe of right foot (HCC)  Right fourth toe wound with concern for early wet gangrene, poa Woke up this morning with R 4th toe turning blue Has been followed by podiatry outpatient for weeks Also follows with vascular surgery for PAD. Prior injury to R 4th toe On Keflex PTA Obtain MRSA screening Start Rocephin 2g daily, switch to IV vancomycin if MRSA screen returns positive. Obtain blood cx x 2 peripherally IV fluid hydration with LR at 50 cc/hr x 2 days Management per vascular surgery and podiatry  Peripheral artery disease, follows with vascular surgery outpatient. She is on aspirin and Xarelto prescribed by vascular surgery. Hold off Xarelto for now in anticipation for possible right fourth toe amputation Resume home Crestor. Management per vascular surgery.  Essential hypertension BP is not at goal Resume home oral antihypertensives. Monitor vital signs. IV antihypertensive as needed with parameters.  Hyperkalemia Presented with K+ 5.2, Cr 1.32 Treat with 1 dose Lokelma 10 mg x 1 Hold off  home ARB. Repeat BMP  in the AM  Type 2 diabetes with hyperglycemia Obtain hemoglobin A1c Hold off home oral hypoglycemics Start insulin sliding scale  Diabetic polyneuropathy Resume home gabapentin  CKD3B cyst Appears to be at her baseline cr 1.32 with GFR 42 Avoid nephrotoxic agents, dehydration and hypotension Monitor urine output Repeat renal panel in the morning.  Chronic normocytic anemia Appears to be at her baseline hemoglobin 8.2, MCV 89 No overt bleeding Monitor H&H Transfuse as indicated.  Hypothyroidism Resume home levothyroxine  Hyperlipidemia Resume home Crestor.  Chronic anxiety/depression Resume home regimen.     DVT prophylaxis: Subcu Lovenox daily.  Code Status: Full code as stated by the patient herself.  Family Communication: None at bedside.  Disposition Plan: Admit to MedSurg unit with remote telemetry.  Consults called: Podiatry, vascular surgery.  Admission status: Inpatient status.  Patient will require at least 2 midnights for further evaluation and treatment of present condition.   Status is: Inpatient    Dispo:  Patient From: Home  Planned Disposition: Home on 05/06/2020 or when vascular surgery and podiatry sign off.  Medically stable for discharge: No, ongoing management of right fourth toe wound with concern for early wet gangrene.         Darlin Drop MD [facility] Pager [phone]  If 7PM-7AM, please contact night-coverage [email or web address] Password [facility]  05/04/2020, 7:27 PM

## 2020-05-04 NOTE — Progress Notes (Shared)
[facility] - Clinic Note  05/05/2020     CHIEF COMPLAINT Patient presents for No chief complaint on file.   HISTORY OF PRESENT ILLNESS: Cheryl Leonard is a 75 y.o. female who presents to the clinic today for:     Referring physician: Danella Penton, MD [address] [facility] [place],  [place] [address]  HISTORICAL INFORMATION:   Selected notes from the MEDICAL RECORD NUMBER Referred by Dr. Senaida Ores for concern of DME OD Lab Results  Component Value Date   HGBA1C 5.9 (H) 10/29/2015       CURRENT MEDICATIONS: No current outpatient medications on file. (Ophthalmic Drugs)   No current facility-administered medications for this visit. (Ophthalmic Drugs)   Current Outpatient Medications (Other)  Medication Sig  . ALPRAZolam (XANAX) 0.25 MG tablet Take 0.25 mg by mouth daily as needed for anxiety or sleep.   [name] aspirin EC 81 MG tablet Take 81 mg by mouth daily.   . cholecalciferol (VITAMIN D) 1000 units tablet Take 1,000 Units by mouth 2 (two) times daily.  [name] CORAL CALCIUM PO Take 1 tablet by mouth 2 (two) times daily.   [name] denosumab (PROLIA) 60 MG/ML SOLN injection Inject 60 mg into the skin every 6 (six) months.   . DULoxetine (CYMBALTA) 30 MG capsule Take 30 mg by mouth daily.  [name] estradiol (ESTRACE) 0.1 MG/GM vaginal cream Apply one pea-sized amount around the opening of the urethra daily for two weeks, then three times weekly thereafter.  . famotidine (PEPCID) 40 MG tablet Take 40 mg by mouth 2 (two) times daily.   . furosemide (LASIX) 20 MG tablet Take 20 mg by mouth daily as needed for fluid or edema.   . gabapentin (NEURONTIN) 100 MG capsule Take 1 capsule by mouth daily.  . Insulin Pen Needle 31G X 5 MM MISC once daily.  [name] levothyroxine (SYNTHROID, LEVOTHROID) 100 MCG tablet Take 100 mcg by mouth daily before breakfast.   . lidocaine-prilocaine (EMLA) cream Apply 1 application topically as needed (apply prior to  port a cath access).  . Magnesium 500 MG TABS Take 500 mg by mouth every morning.   . metFORMIN (GLUCOPHAGE) 1000 MG tablet Take 1 tablet by mouth 2 (two) times daily with a meal.  . metoprolol succinate (TOPROL-XL) 50 MG 24 hr tablet Take by mouth.  . mirabegron ER (MYRBETRIQ) 50 MG TB24 tablet Take 1 tablet (50 mg total) by mouth daily.  . mirtazapine (REMERON) 15 MG tablet Take by mouth.  . Multiple Vitamin (MULTIVITAMIN WITH MINERALS) TABS tablet Take 1 tablet by mouth daily.  . mupirocin cream (BACTROBAN) 2 % Apply topically 2 (two) times daily.  . nitrofurantoin (MACRODANTIN) 100 MG capsule Take by mouth.  . nystatin cream (MYCOSTATIN) Apply 1 application topically 2 (two) times daily.  [name] olmesartan (BENICAR) 20 MG tablet Take 20 mg by mouth daily.  . pantoprazole (PROTONIX) 40 MG tablet Take 40 mg by mouth every morning.   . rivaroxaban (XARELTO) 20 MG TABS tablet Take 1 tablet (20 mg total) by mouth daily with supper.  . rosuvastatin (CRESTOR) 20 MG tablet Take 20 mg by mouth every morning.   . TRESIBA FLEXTOUCH 200 UNIT/ML SOPN Inject 30 Units as directed at bedtime.   . vitamin E 400 UNIT capsule Take 400 Units by mouth daily.  [name] zolpidem (AMBIEN) 5 MG tablet Take 1 tablet (5 mg total) by mouth at bedtime. (Patient taking differently: Take 5 mg by mouth  at bedtime as needed.)   No current facility-administered medications for this visit. (Other)      REVIEW OF SYSTEMS:    ALLERGIES Allergies  Allergen Reactions  . Ace Inhibitors Other (See Comments)    PAST MEDICAL HISTORY Past Medical History:  Diagnosis Date  . Anemia   . Anxiety   . Arthritis    Gout  . Cataracts, both eyes   . Diabetic retinopathy (HCC)    NPDR OU  . Diabetic retinopathy (HCC)   . GERD (gastroesophageal reflux disease)   . Gout   . Headache    h/o migraines  . History of fracture of patella    right knee  . History of positive PPD    Patient always shows positive  . Hyperlipidemia    . Hypertension   . Hypertensive retinopathy    OU  . Hypothyroidism   . Lichen sclerosus 12/30/2013   of vulva  . Metatarsal fracture   . Neuropathy   . Peripheral vascular disease (HCC)   . Polyneuropathy    numbness and tingling in feet and toes  . Renal insufficiency    Stage 3  . Sleep apnea    does not use cpap-lost weight   . Type 2 diabetes mellitus, uncontrolled (HCC)    Past Surgical History:  Procedure Laterality Date  . ABDOMINAL HYSTERECTOMY    . APPENDECTOMY    . BREAST REDUCTION SURGERY  2001  . CATARACT EXTRACTION    . CESAREAN SECTION  1976  . COLONOSCOPY  03/05/2013   Nml - due for repeat 03/06/2018  . COLONOSCOPY WITH PROPOFOL N/A 03/18/2019   Procedure: COLONOSCOPY WITH PROPOFOL;  Surgeon: Toledo, [name], MD;  Location: [facility];  Service: Gastroenterology;  Laterality: N/A;  . DIAGNOSTIC LAPAROSCOPY    . DILATION AND CURETTAGE OF UTERUS  1989  . ENDARTERECTOMY FEMORAL Bilateral 03/09/2018   Procedure: ENDARTERECTOMY FEMORAL;  Surgeon: Annice Needy, MD;  Location: [facility];  Service: Vascular;  Laterality: Bilateral;  . ENDARTERECTOMY POPLITEAL Left 03/09/2018   Procedure: ENDARTERECTOMY POPLITEAL AND SFA;  Surgeon: Annice Needy, MD;  Location: [facility];  Service: Vascular;  Laterality: Left;  . ESOPHAGOGASTRODUODENOSCOPY  03/05/2013  . ESOPHAGOGASTRODUODENOSCOPY (EGD) WITH PROPOFOL N/A 03/18/2019   Procedure: ESOPHAGOGASTRODUODENOSCOPY (EGD) WITH PROPOFOL;  Surgeon: Toledo, [name], MD;  Location: [facility];  Service: Gastroenterology;  Laterality: N/A;  . EYE SURGERY    . Eyelid Surgery  2012  . INTRAMEDULLARY (IM) NAIL INTERTROCHANTERIC Left 10/30/2015   Procedure: INTRAMEDULLARY (IM) NAIL INTERTROCHANTRIC ;  Surgeon: Kennedy Bucker, MD;  Location: [facility];  Service: Orthopedics;  Laterality: Left;  . KYPHOPLASTY N/A 10/25/2018   Procedure: L4 KYPHOPLASTY;  Surgeon: Kennedy Bucker, MD;  Location: [facility];  Service: Orthopedics;  Laterality: N/A;   . LAPAROSCOPIC HYSTERECTOMY  2000   total  . LOWER EXTREMITY ANGIOGRAPHY Left 03/08/2017   Procedure: LOWER EXTREMITY ANGIOGRAPHY;  Surgeon: Annice Needy, MD;  Location: [facility];  Service: Cardiovascular;  Laterality: Left;  . LOWER EXTREMITY ANGIOGRAPHY Left 10/30/2017   Procedure: LOWER EXTREMITY ANGIOGRAPHY;  Surgeon: Annice Needy, MD;  Location: [facility];  Service: Cardiovascular;  Laterality: Left;  . LOWER EXTREMITY ANGIOGRAPHY Right 03/08/2018   Procedure: LOWER EXTREMITY ANGIOGRAPHY;  Surgeon: Annice Needy, MD;  Location: [facility];  Service: Cardiovascular;  Laterality: Right;  . LOWER EXTREMITY ANGIOGRAPHY Left 10/01/2018   Procedure: LOWER EXTREMITY ANGIOGRAPHY;  Surgeon: Annice Needy, MD;  Location:  [facility];  Service: Cardiovascular;  Laterality: Left;  . LOWER EXTREMITY ANGIOGRAPHY Right 10/08/2018   Procedure: LOWER EXTREMITY ANGIOGRAPHY;  Surgeon: Annice Needyew, Jason S, MD;  Location: [facility];  Service: Cardiovascular;  Laterality: Right;  . PERIPHERAL VASCULAR INTERVENTION  03/08/2018   Procedure: PERIPHERAL VASCULAR INTERVENTION;  Surgeon: Annice Needyew, Jason S, MD;  Location: [facility];  Service: Cardiovascular;;  . PORTA CATH INSERTION N/A 02/17/2020   Procedure: PORTA CATH INSERTION;  Surgeon: Annice Needyew, Jason S, MD;  Location: [facility];  Service: Cardiovascular;  Laterality: N/A;  . REDUCTION MAMMAPLASTY  1997  . SACROPLASTY N/A 10/25/2018   Procedure: S1 SACROPLASTY;  Surgeon: Kennedy BuckerMenz, Michael, MD;  Location: [facility];  Service: Orthopedics;  Laterality: N/A;    FAMILY HISTORY Family History  Problem Relation Age of Onset  . Coronary artery disease Father   . Heart attack Father   . Coronary artery disease Mother   . Heart attack Mother   . Ovarian cancer Sister 143       sister had hormonal therapy for IVF txs-which increased risk factor for ovarian cancer  . Breast cancer Neg Hx     SOCIAL HISTORY Social  History   Tobacco Use  . Smoking status: Former Smoker    Packs/day: 1.00    Years: 20.00    Pack years: 20.00    Types: Cigarettes    Quit date: 03/07/1996    Years since quitting: 24.1  . Smokeless tobacco: Never Used  . Tobacco comment: started smoking at age 75  Vaping Use  . Vaping Use: Never used  Substance Use Topics  . Alcohol use: No    Alcohol/week: 0.0 standard drinks  . Drug use: No         OPHTHALMIC EXAM:  Not recorded     IMAGING AND PROCEDURES  Imaging and Procedures for 04/25/17           ASSESSMENT/PLAN:  No diagnosis found.  1,2. BRVO w/ CME OD  - by history, pt states symptoms first noticed 2 wks prior to presentation, but reports changes may have occurred prior  - initial exam with differential tortuosity of vessels (OD > OS)  - FA (02.10.20) shows mild late staining / leakage in macula, staining / leakage of disc -- improving CME  - differential includes DM2 (DME), hypertensive retinopathy, inflammatory etiology / uveitis  - S/P IVA OD #1 (02.08.19), #2 (03.11.19), #3 (04.09.19), #4 (05.20.19), #5 (02.10.20)  - gave IVA OD on 2.10.20 due to pending Eylea4U for 2020 -- resulted in increased IRF/CME  - review of OCTs show persistent IRF and cystic changes --  resistance to IVA   - June 2019 -- switched therapies: S/P IVE OD #1 (06.24.19), #2 (07.24.19), #3 (09.04.19), #4 (10.30.19),#5 (12.30.19), #6 (03.23.20), #7 (05.05.20), #8 (07.16.20), #9 (07.17.20), #10 (08.28.20), #11 (10.13.20), # 12 (11.17.20), #13 (2.8.21), #14 (03.09.21), #15 (04.13.21), #16 (05.11.21), #17 (06.17.21), #18 (07.23.21), #19 (08.30.21), #20 (10.04.21), #21 (11.08.21), #22 (12.08.21), #23 (01.31.22), #24 (02.28.22), #25 (04.01.22)  - OCT today shows mild interval increase in IRF OD at 4.5 wks  - BCVA 20/40 -- stable  - Eylea4U benefits investigation completed and pt approved for IVE for 2022  - recommend IVE OD #26 today, 05.03.22 w/ f/u in 4 wks  - RBA of procedure  discussed, questions answered  - informed consent obtained  - see procedure note  - Eylea informed consent form obtained and scanned on 11.19.2020  - f/u  4 weeks  -- DFE/OCT/possible injection  3. Mild nonproliferative diabetic retinopathy, both eyes  - could be contributing to CME OD  - OS with minimal diabetic retinopathy  - continue to monitor  4,5. Hypertensive retinopathy OU - stable  - as above, may have contributing to CME OD  - discussed importance of tight BP control  - monitor  6. Epiretinal membrane, right eye   - stable nasal ERM  - no indication for surgery at this time  7. Pseudophakia OU  - s/p CE/IOL OU by cataract surgeon in [facility]  - doing well  - monitor    Ophthalmic Meds Ordered this visit:  No orders of the defined types were placed in this encounter.      No follow-ups on file.  This document serves as a record of services personally performed by Karie Chimera, MD, PhD. It was created on their behalf by Glee Arvin. Manson Passey, OA an ophthalmic technician. The creation of this record is the provider's dictation and/or activities during the visit.    Electronically signed by: Glee Arvin. Manson Passey, [place] 03.31.2022 10:49 AM  This document serves as a record of services personally performed by Karie Chimera, MD, PhD. It was created on their behalf by Cristopher Estimable, COT an ophthalmic technician. The creation of this record is the provider's dictation and/or activities during the visit.    Electronically signed by: Cristopher Estimable, COT 5.2.22 @ 10:49 AM  Abbreviations: M myopia (nearsighted); A astigmatism; H hyperopia (farsighted); P presbyopia; Mrx spectacle prescription;  CTL contact lenses; OD right eye; OS left eye; OU both eyes  XT exotropia; ET esotropia; PEK punctate epithelial keratitis; PEE punctate epithelial erosions; DES dry eye syndrome; MGD meibomian gland dysfunction; ATs artificial tears; PFAT's preservative free artificial tears; NSC nuclear sclerotic  cataract; PSC posterior subcapsular cataract; ERM epi-retinal membrane; PVD posterior vitreous detachment; RD retinal detachment; DM diabetes mellitus; DR diabetic retinopathy; NPDR non-proliferative diabetic retinopathy; PDR proliferative diabetic retinopathy; CSME clinically significant macular edema; DME diabetic macular edema; dbh dot blot hemorrhages; CWS cotton wool spot; POAG primary open angle glaucoma; C/D cup-to-disc ratio; HVF humphrey visual field; GVF goldmann visual field; OCT optical coherence tomography; IOP intraocular pressure; BRVO Branch retinal vein occlusion; CRVO central retinal vein occlusion; CRAO central retinal artery occlusion; BRAO branch retinal artery occlusion; RT retinal tear; SB scleral buckle; PPV pars plana vitrectomy; VH Vitreous hemorrhage; PRP panretinal laser photocoagulation; IVK intravitreal kenalog; VMT vitreomacular traction; MH Macular hole;  NVD neovascularization of the disc; NVE neovascularization elsewhere; AREDS age related eye disease study; ARMD age related macular degeneration; POAG primary open angle glaucoma; EBMD epithelial/anterior basement membrane dystrophy; ACIOL anterior chamber intraocular lens; IOL intraocular lens; PCIOL posterior chamber intraocular lens; Phaco/IOL phacoemulsification with intraocular lens placement; PRK photorefractive keratectomy; LASIK laser assisted in situ keratomileusis; HTN hypertension; DM diabetes mellitus; COPD chronic obstructive pulmonary disease

## 2020-05-05 ENCOUNTER — Inpatient Hospital Stay: Payer: Medicare Other

## 2020-05-05 ENCOUNTER — Encounter (INDEPENDENT_AMBULATORY_CARE_PROVIDER_SITE_OTHER): Payer: Medicare Other | Admitting: Ophthalmology

## 2020-05-05 DIAGNOSIS — B999 Unspecified infectious disease: Secondary | ICD-10-CM | POA: Diagnosis not present

## 2020-05-05 DIAGNOSIS — I96 Gangrene, not elsewhere classified: Secondary | ICD-10-CM

## 2020-05-05 LAB — CBC
HCT: 23.1 % — ABNORMAL LOW (ref 36.0–46.0)
Hemoglobin: 7.4 g/dL — ABNORMAL LOW (ref 12.0–15.0)
MCH: 27.8 pg (ref 26.0–34.0)
MCHC: 32 g/dL (ref 30.0–36.0)
MCV: 86.8 fL (ref 80.0–100.0)
Platelets: 257 10*3/uL (ref 150–400)
RBC: 2.66 MIL/uL — ABNORMAL LOW (ref 3.87–5.11)
RDW: 18.1 % — ABNORMAL HIGH (ref 11.5–15.5)
WBC: 5 10*3/uL (ref 4.0–10.5)
nRBC: 0.4 % — ABNORMAL HIGH (ref 0.0–0.2)

## 2020-05-05 LAB — MAGNESIUM: Magnesium: 1.8 mg/dL (ref 1.7–2.4)

## 2020-05-05 LAB — GLUCOSE, CAPILLARY
Glucose-Capillary: 243 mg/dL — ABNORMAL HIGH (ref 70–99)
Glucose-Capillary: 300 mg/dL — ABNORMAL HIGH (ref 70–99)
Glucose-Capillary: 111 mg/dL — ABNORMAL HIGH (ref 70–99)
Glucose-Capillary: 169 mg/dL — ABNORMAL HIGH (ref 70–99)

## 2020-05-05 LAB — BASIC METABOLIC PANEL
Anion gap: 9 (ref 5–15)
BUN: 27 mg/dL — ABNORMAL HIGH (ref 8–23)
CO2: 22 mmol/L (ref 22–32)
Calcium: 9.2 mg/dL (ref 8.9–10.3)
Chloride: 103 mmol/L (ref 98–111)
Creatinine, Ser: 1.27 mg/dL — ABNORMAL HIGH (ref 0.44–1.00)
GFR, Estimated: 44 mL/min — ABNORMAL LOW (ref >60)
Glucose, Bld: 376 mg/dL — ABNORMAL HIGH (ref 70–99)
Potassium: 4.9 mmol/L (ref 3.5–5.1)
Sodium: 134 mmol/L — ABNORMAL LOW (ref 135–145)

## 2020-05-05 LAB — PHOSPHORUS: Phosphorus: 3.7 mg/dL (ref 2.5–4.6)

## 2020-05-05 LAB — SARS CORONAVIRUS 2 (TAT 6-24 HRS): SARS Coronavirus 2: NEGATIVE

## 2020-05-05 LAB — HEMOGLOBIN A1C
Hgb A1c MFr Bld: 6.3 % — ABNORMAL HIGH (ref 4.8–5.6)
Mean Plasma Glucose: 134.11 mg/dL

## 2020-05-05 IMAGING — MR MR FOOT*R* W/O CM
5 series · 40 of 40 positions shown · non-contrast
Comparison: Foot radiographs of [DATE]

CLINICAL DATA: Foot swelling and diabetes. Wound infection along
the right fourth toe

EXAM:
MRI OF THE RIGHT FOREFOOT WITHOUT CONTRAST
TECHNIQUE: Multiplanar, multisequence MR imaging of the right forefoot was
performed. No intravenous contrast was administered.

[Series 3: T1 · coronal · right · 3.0mm · 0.38mm/px · 11 of 45 slices shown (1 of 2)]
[im 1/45]
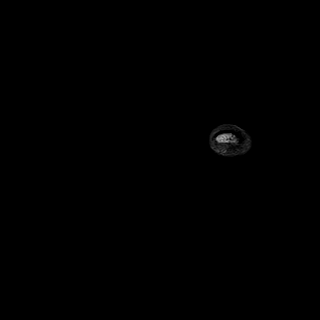
[im 5/45]
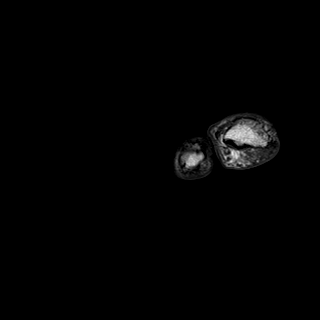
[im 9/45]
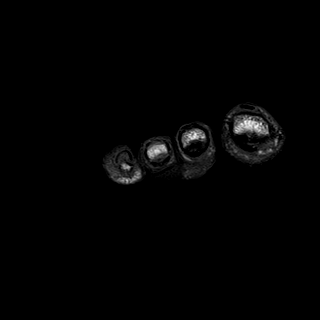
[im 14/45]
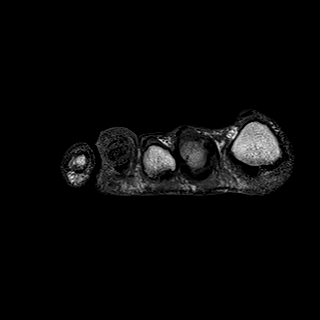
[im 18/45]
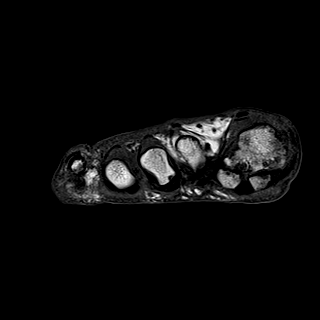
[im 23/45]
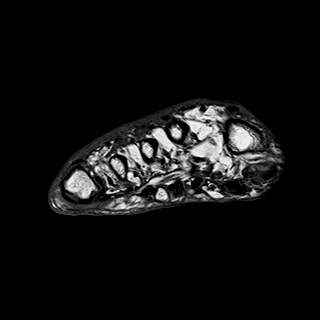
[im 27/45]
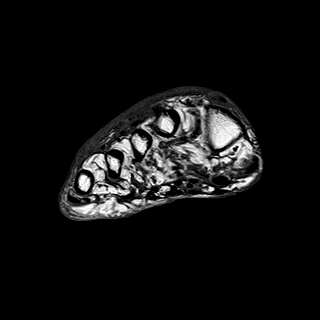
[im 31/45]
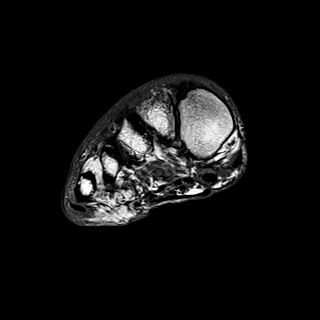
[im 36/45]
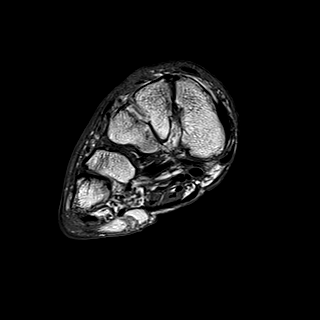
[im 40/45]
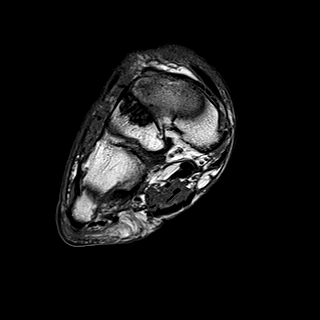
[im 45/45]
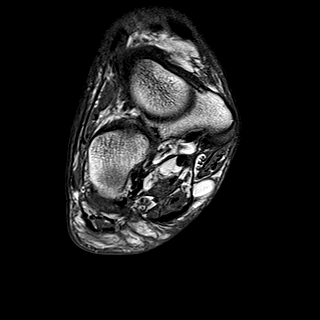

[Series 5: T2 · coronal · right · 3.0mm · 0.50mm/px · 12 of 45 slices shown (1 of 2)]
[im 1/45]
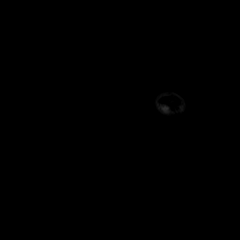
[im 5/45]
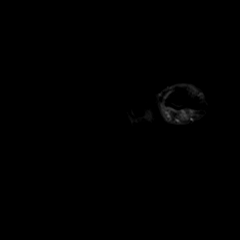
[im 9/45]
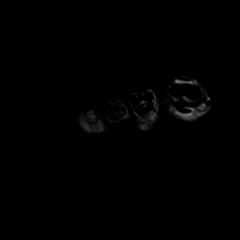
[im 13/45]
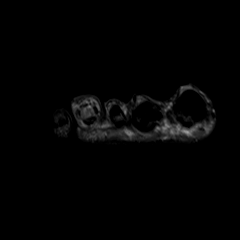
[im 17/45]
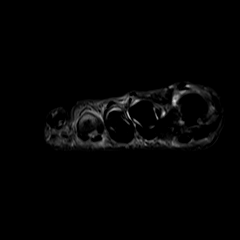
[im 21/45]
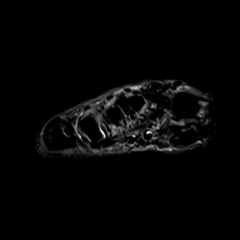
[im 25/45]
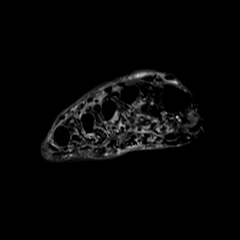
[im 29/45]
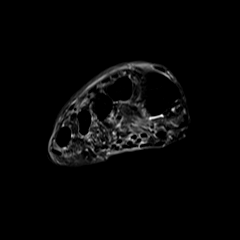
[im 33/45]
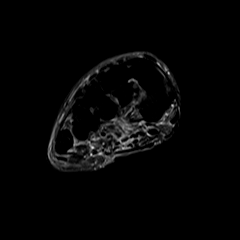
[im 37/45]
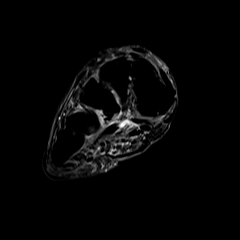
[im 41/45]
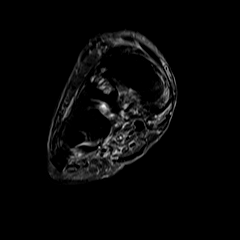
[im 45/45]
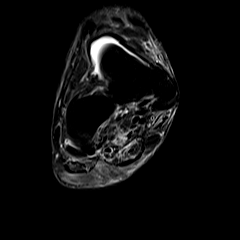

[Series 7: STIR · sagittal · right · 3.0mm · 0.62mm/px · 7 of 28 slices shown]
[im 1/28]
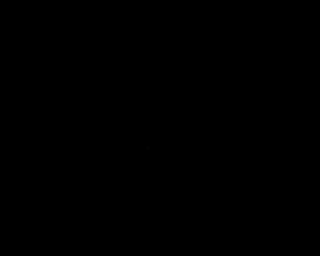
[im 5/28]
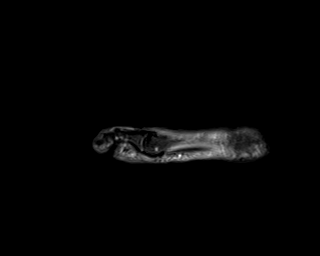
[im 10/28]
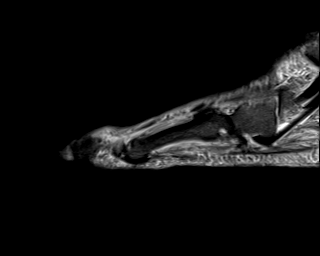
[im 14/28]
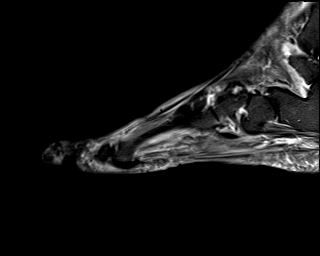
[im 19/28]
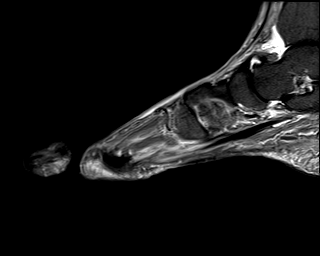
[im 23/28]
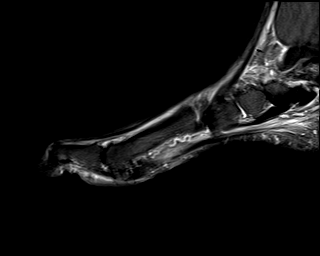
[im 28/28]
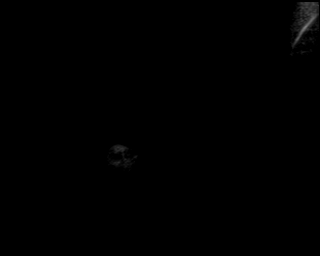

[Series 9: T2 · axial · right · 3.0mm · 0.70mm/px · z∈[-115,-58]mm · 5 of 18 slices shown (2 of 2)]
[im 1/18]
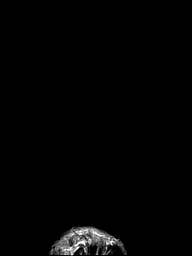
[im 5/18]
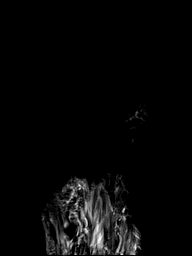
[im 9/18]
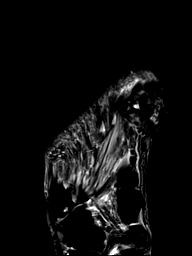
[im 13/18]
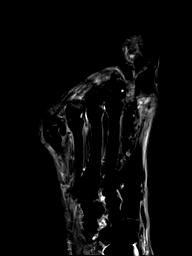
[im 18/18]
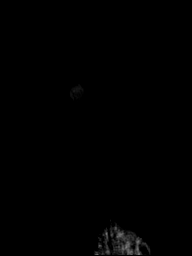

[Series 10: T1 · axial · right · 3.0mm · 0.70mm/px · z∈[-115,-58]mm · 5 of 18 slices shown (2 of 2)]
[im 1/18]
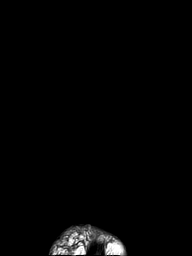
[im 5/18]
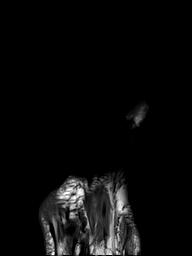
[im 9/18]
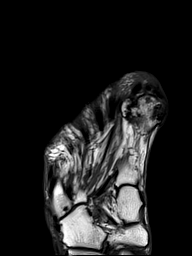
[im 13/18]
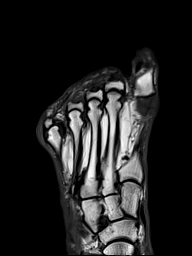
[im 18/18]
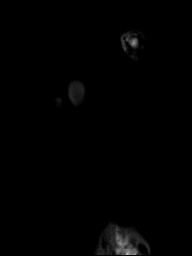

[40 of 40 positions shown; findings below may reference images not displayed]

FINDINGS: Bones/Joint/Cartilage

Abnormal high T2 and low T1 signal in the proximal, middle, and
distal phalanges of the fourth toe, highly suspicious in this
clinical context for active osteomyelitis.

Subtle endosteal edema along the medial first digit sesamoid is
probably attributable to the severe degree of degenerative
arthropathy between the first metatarsal head and the sesamoids.

Notable erosions of the first metatarsal head especially medially,
raising suspicion for gout arthropathy. Erosion or degenerative
subcortical cyst along the medial base of the lateral cuneiform,
image 5 series 10.

Dorsal talonavicular joint effusion with mild spurring. Small
anterior tibiotalar joint effusion.

Deformity along the base of the proximal phalanx of the small toe,
posttraumatic versus due to chronic arthropathy.

Ligaments

Lisfranc ligament intact

Muscles and Tendons

Low-level accentuated T2 signal diffusely through the regional
plantar musculature, probably neurogenic.

Soft tissues

Dorsal subcutaneous edema in the foot extending into the toes. Edema
is most striking in the fourth toe and probably represent
cellulitis.
IMPRESSION: 1. Abnormal osseous edema in the phalanges of the fourth toe highly
suspicious in this clinical context for acute osteomyelitis.
Surrounding cellulitis in the fourth toe.
2. Notable erosions in the head of the first metatarsal, probably
from gout. Advanced degenerative arthropathy between the metatarsal
head and the first digit sesamoids.
3. Degenerative subcortical cyst or erosion along the medial base of
the lateral cuneiform.
4. Small effusions of the talonavicular and tibiotalar articulations
seen on the sagittal images.
5. Potential low-grade sesamoiditis of the medial sesamoid of the
first digit. However this may be due to the underlying arthropathy.
6. Chronic appearing deformity of the base of the proximal phalanx
small toe, likely posttraumatic.

## 2020-05-05 MED ORDER — INSULIN ASPART 100 UNIT/ML IJ SOLN
3.0000 [IU] | Freq: Three times a day (TID) | INTRAMUSCULAR | Status: DC
  Administered 2020-05-06 – 2020-05-09 (×8): 3 [IU] via SUBCUTANEOUS
  Filled 2020-05-05 (×8): qty 1

## 2020-05-05 MED ORDER — INSULIN GLARGINE 100 UNIT/ML ~~LOC~~ SOLN
25.0000 [IU] | Freq: Every day | SUBCUTANEOUS | Status: DC
  Administered 2020-05-05 – 2020-05-06 (×2): 25 [IU] via SUBCUTANEOUS
  Filled 2020-05-05 (×3): qty 0.25

## 2020-05-05 MED ORDER — CHLORHEXIDINE GLUCONATE CLOTH 2 % EX PADS
6.0000 | MEDICATED_PAD | Freq: Every day | CUTANEOUS | Status: DC
  Administered 2020-05-05 – 2020-05-09 (×5): 6 via TOPICAL

## 2020-05-05 NOTE — Consult Note (Signed)
[facility] Vascular Consult Note  MRN : [id number]  Cheryl Leonard is a 75 y.o. ([birth year]) female who presents with chief complaint of  Chief Complaint  Patient presents with  . Toe Pain   History of Present Illness:  Cheryl Leonard is a 75 year old female with medical history significant for peripheral artery disease on Xarelto who states her right fourth toe was injured essential hypertension, type 2 diabetes, CKD 3B, chronic normocytic anemia, who presented to [facility] ED.   Patient initially had an injury to right toe. Has been on p.o. antibiotics Keflex for the past 7 days with some improvement however this morning after waking up she noted that her right fourth toe was bluish in appearance.  No pain due to her polyneuropathy.  She presented to her podiatrist this morning who recommended that she come to the ED for possible amputation of her toe.  EDP contacted podiatry Dr. Excell Seltzer who recommended admission.    ED Course:   Afebrile.  BP 145/76, pulse 81, respiratory rate 18, O2 saturation 97% on room air.  Lab studies remarkable for serum potassium 5.2, serum bicarb 21, serum glucose 196, BUN 33, creatinine 1.32, GFR 42.  Vascular surgery was consulted by Dr. Excell Seltzer for possible endovascular intervention.  Current Facility-Administered Medications  Medication Dose Route Frequency Provider Last Rate Last Admin  . acetaminophen (TYLENOL) tablet 650 mg  650 mg Oral Q6H PRN Darlin Drop, DO      . ALPRAZolam Prudy Feeler) tablet 0.25 mg  0.25 mg Oral Daily PRN Dow Adolph N, DO      . amLODipine (NORVASC) tablet 5 mg  5 mg Oral Daily [place], Carole N, DO   5 mg at 05/05/20 4709  . cefTRIAXone (ROCEPHIN) 2 g in sodium chloride 0.9 % 100 mL IVPB  2 g Intravenous Q24H Dow Adolph N, DO 200 mL/hr at 05/04/20 2350 2 g at 05/04/20 2350  . Chlorhexidine Gluconate Cloth 2 % PADS 6 each  6 each Topical Daily [place], Eric J, DO   6 each at 05/05/20 6283  . DULoxetine  (CYMBALTA) DR capsule 30 mg  30 mg Oral Daily Dow Adolph N, DO   30 mg at 05/05/20 6629  . enoxaparin (LOVENOX) injection 40 mg  40 mg Subcutaneous Q24H Dow Adolph N, DO   40 mg at 05/04/20 2339  . famotidine (PEPCID) tablet 40 mg  40 mg Oral BID Dow Adolph N, DO   40 mg at 05/05/20 4765  . gabapentin (NEURONTIN) capsule 100 mg  100 mg Oral Daily Dow Adolph N, DO   100 mg at 05/05/20 4650  . hydrALAZINE (APRESOLINE) injection 5 mg  5 mg Intravenous Q6H PRN Dow Adolph N, DO      . insulin aspart (novoLOG) injection 0-5 Units  0-5 Units Subcutaneous QHS Hall, Carole N, DO      . insulin aspart (novoLOG) injection 0-9 Units  0-9 Units Subcutaneous TID WC Dow Adolph N, DO   5 Units at 05/05/20 1243  . insulin aspart (novoLOG) injection 3 Units  3 Units Subcutaneous TID WC [place], Eric J, DO      . insulin glargine (LANTUS) injection 25 Units  25 Units Subcutaneous QHS [place], Eric J, DO      . lactated ringers infusion   Intravenous Continuous Darlin Drop, DO 50 mL/hr at 05/05/20 1500 Infusion Verify at 05/05/20 1500  . levothyroxine (SYNTHROID) tablet 100 mcg  100 mcg Oral QAC breakfast Dow Adolph  N, DO   100 mcg at 05/05/20 0621  . melatonin tablet 2.5 mg  2.5 mg Oral QHS PRN Hall, Carole N, DO   2.5 mg at 05/05/20 0113  . metoprolol succinate (TOPROL-XL) 24 hr tablet 50 mg  50 mg Oral Daily Hall, Carole N, DO   50 mg at 05/05/20 0952  . ondansetron (ZOFRAN) injection 4 mg  4 mg Intravenous Q6H PRN Hall, Carole N, DO      . pantoprazole (PROTONIX) EC tablet 40 mg  40 mg Oral BH-q7a Hall, Carole N, DO   40 mg at 05/05/20 0619  . rosuvastatin (CRESTOR) tablet 20 mg  20 mg Oral BH-q7a Hall, Carole N, DO   20 mg at 05/05/20 0619   Past Medical History:  Diagnosis Date  . Anemia   . Anxiety   . Arthritis    Gout  . Cataracts, both eyes   . Diabetic retinopathy (HCC)    NPDR OU  . Diabetic retinopathy (HCC)   . GERD (gastroesophageal reflux disease)   . Gout   . Headache     h/o migraines  . History of fracture of patella    right knee  . History of positive PPD    Patient always shows positive  . Hyperlipidemia   . Hypertension   . Hypertensive retinopathy    OU  . Hypothyroidism   . Lichen sclerosus 12/30/2013   of vulva  . Metatarsal fracture   . Neuropathy   . Peripheral vascular disease (HCC)   . Polyneuropathy    numbness and tingling in feet and toes  . Renal insufficiency    Stage 3  . Sleep apnea    does not use cpap-lost weight   . Type 2 diabetes mellitus, uncontrolled (HCC)    Past Surgical History:  Procedure Laterality Date  . ABDOMINAL HYSTERECTOMY    . APPENDECTOMY    . BREAST REDUCTION SURGERY  2001  . CATARACT EXTRACTION    . CESAREAN SECTION  1976  . COLONOSCOPY  03/05/2013   Nml - due for repeat 03/06/2018  . COLONOSCOPY WITH PROPOFOL N/A 03/18/2019   Procedure: COLONOSCOPY WITH PROPOFOL;  Surgeon: Toledo, Teodoro K, MD;  Location: [facility];  Service: Gastroenterology;  Laterality: N/A;  . DIAGNOSTIC LAPAROSCOPY    . DILATION AND CURETTAGE OF UTERUS  1989  . ENDARTERECTOMY FEMORAL Bilateral 03/09/2018   Procedure: ENDARTERECTOMY FEMORAL;  Surgeon: Dew, Jason S, MD;  Location: [facility];  Service: Vascular;  Laterality: Bilateral;  . ENDARTERECTOMY POPLITEAL Left 03/09/2018   Procedure: ENDARTERECTOMY POPLITEAL AND SFA;  Surgeon: Dew, Jason S, MD;  Location: [facility];  Service: Vascular;  Laterality: Left;  . ESOPHAGOGASTRODUODENOSCOPY  03/05/2013  . ESOPHAGOGASTRODUODENOSCOPY (EGD) WITH PROPOFOL N/A 03/18/2019   Procedure: ESOPHAGOGASTRODUODENOSCOPY (EGD) WITH PROPOFOL;  Surgeon: Toledo, Teodoro K, MD;  Location: [facility];  Service: Gastroenterology;  Laterality: N/A;  . EYE SURGERY    . Eyelid Surgery  2012  . INTRAMEDULLARY (IM) NAIL INTERTROCHANTERIC Left 10/30/2015   Procedure: INTRAMEDULLARY (IM) NAIL INTERTROCHANTRIC ;  Surgeon: Michael Menz, MD;  Location: [facility];  Service: Orthopedics;  Laterality: Left;  .  KYPHOPLASTY N/A 10/25/2018   Procedure: L4 KYPHOPLASTY;  Surgeon: Menz, Michael, MD;  Location: [facility];  Service: Orthopedics;  Laterality: N/A;  . LAPAROSCOPIC HYSTERECTOMY  2000   total  . LOWER EXTREMITY ANGIOGRAPHY Left 03/08/2017   Procedure: LOWER EXTREMITY ANGIOGRAPHY;  Surgeon: Dew, Jason S, MD;  Location: [facility];  Service: Cardiovascular;    Laterality: Left;  . LOWER EXTREMITY ANGIOGRAPHY Left 10/30/2017   Procedure: LOWER EXTREMITY ANGIOGRAPHY;  Surgeon: Annice Needy, MD;  Location: [facility];  Service: Cardiovascular;  Laterality: Left;  . LOWER EXTREMITY ANGIOGRAPHY Right 03/08/2018   Procedure: LOWER EXTREMITY ANGIOGRAPHY;  Surgeon: Annice Needy, MD;  Location: [facility];  Service: Cardiovascular;  Laterality: Right;  . LOWER EXTREMITY ANGIOGRAPHY Left 10/01/2018   Procedure: LOWER EXTREMITY ANGIOGRAPHY;  Surgeon: Annice Needy, MD;  Location: [facility];  Service: Cardiovascular;  Laterality: Left;  . LOWER EXTREMITY ANGIOGRAPHY Right 10/08/2018   Procedure: LOWER EXTREMITY ANGIOGRAPHY;  Surgeon: Annice Needy, MD;  Location: [facility];  Service: Cardiovascular;  Laterality: Right;  . PERIPHERAL VASCULAR INTERVENTION  03/08/2018   Procedure: PERIPHERAL VASCULAR INTERVENTION;  Surgeon: Annice Needy, MD;  Location: [facility];  Service: Cardiovascular;;  . PORTA CATH INSERTION N/A 02/17/2020   Procedure: PORTA CATH INSERTION;  Surgeon: Annice Needy, MD;  Location: [facility];  Service: Cardiovascular;  Laterality: N/A;  . REDUCTION MAMMAPLASTY  1997  . SACROPLASTY N/A 10/25/2018   Procedure: S1 SACROPLASTY;  Surgeon: Kennedy Bucker, MD;  Location: [facility];  Service: Orthopedics;  Laterality: N/A;   Social History Social History   Tobacco Use  . Smoking status: Former Smoker    Packs/day: 1.00    Years: 20.00    Pack years: 20.00    Types: Cigarettes    Quit date: 03/07/1996    Years since quitting: 24.1  .  Smokeless tobacco: Never Used  . Tobacco comment: started smoking at age 24  Vaping Use  . Vaping Use: Never used  Substance Use Topics  . Alcohol use: No    Alcohol/week: 0.0 standard drinks  . Drug use: No   Family History Family History  Problem Relation Age of Onset  . Coronary artery disease Father   . Heart attack Father   . Coronary artery disease Mother   . Heart attack Mother   . Ovarian cancer Sister 40       sister had hormonal therapy for IVF txs-which increased risk factor for ovarian cancer  . Breast cancer Neg Hx   Denies family history of peripheral artery disease, venous disease or renal disease.  Allergies  Allergen Reactions  . Ace Inhibitors Other (See Comments)   REVIEW OF SYSTEMS (Negative unless checked)  Constitutional: [] Weight loss  [] Fever  [] Chills Cardiac: [] Chest pain   [] Chest pressure   [] Palpitations   [] Shortness of breath when laying flat   [] Shortness of breath at rest   [] Shortness of breath with exertion. Vascular:  [] Pain in legs with walking   [] Pain in legs at rest   [] Pain in legs when laying flat   [] Claudication   [] Pain in feet when walking  [] Pain in feet at rest  [] Pain in feet when laying flat   [] History of DVT   [] Phlebitis   [] Swelling in legs   [] Varicose veins   [] Non-healing ulcers Pulmonary:   [] Uses home oxygen   [] Productive cough   [] Hemoptysis   [] Wheeze  [] COPD   [] Asthma Neurologic:  [] Dizziness  [] Blackouts   [] Seizures   [] History of stroke   [] History of TIA  [] Aphasia   [] Temporary blindness   [] Dysphagia   [] Weakness or numbness in arms   [] Weakness or numbness in legs Musculoskeletal:  [x] Arthritis   [] Joint swelling   [] Joint pain   [] Low back pain Hematologic:  []   Easy bruising  [] Easy bleeding   [] Hypercoagulable state   [] Anemic  [] Hepatitis Gastrointestinal:  [] Blood in stool   [] Vomiting blood  [] Gastroesophageal reflux/heartburn   [] Difficulty swallowing. Genitourinary:  [x] Chronic kidney disease   [] Difficult  urination  [] Frequent urination  [] Burning with urination   [] Blood in urine Skin:  [] Rashes   [] Ulcers   [] Wounds Psychological:  [] History of anxiety   []  History of major depression.  Physical Examination  Vitals:   05/04/20 2026 05/05/20 0009 05/05/20 0553 05/05/20 0851  BP: (!) 161/71 (!) 158/139 (!) 146/59 (!) 152/59  Pulse: 70 72 72 84  Resp: 17 17 16 18   Temp: 98 F (36.7 C) 98.7 F (37.1 C) 98.6 F (37 C) 97.8 F (36.6 C)  TempSrc: Oral Oral Oral Oral  SpO2: 97%  99% 100%  Weight:      Height:       Body mass index is 26.63 kg/m. Gen:  WD/WN, NAD Head: [place]/AT, No temporalis wasting. Prominent temp pulse not noted. Ear/Nose/Throat: Hearing grossly intact, nares w/o erythema or drainage, oropharynx w/o Erythema/Exudate Eyes: Sclera non-icteric, conjunctiva clear Neck: Trachea midline.  No JVD.  Pulmonary:  Good air movement, respirations not labored, equal bilaterally.  Cardiac: RRR, normal S1, S2. Vascular:  Vessel Right Left  Radial Palpable Palpable  Ulnar Palpable Palpable  Brachial Palpable Palpable  Carotid Palpable, without bruit Palpable, without bruit  Aorta Not palpable N/A  Femoral Palpable Palpable  Popliteal Palpable Palpable  PT Non-Palpable Non-Palpable  DP Non-Palpable Non-Palpable   Right lower extremity: Thigh soft.  Calf soft.  Extremities warm distally to toes.  Hard to palpate pedal pulses.  There is some discoloration possibly early gangrenous changes noted to the fourth toe.  Clean and dry at this time.  Gastrointestinal: soft, non-tender/non-distended. No guarding/reflex.  Musculoskeletal: M/S 5/5 throughout.  Extremities without ischemic changes.  No deformity or atrophy. No edema. Neurologic: Sensation grossly intact in extremities.  Symmetrical.  Speech is fluent. Motor exam as listed above. Psychiatric: Judgment intact, Mood & affect appropriate for pt's clinical situation. Dermatologic: As above  Lymph : No Cervical, Axillary, or  Inguinal lymphadenopathy.  CBC Lab Results  Component Value Date   WBC 5.0 05/05/2020   HGB 7.4 (L) 05/05/2020   HCT 23.1 (L) 05/05/2020   MCV 86.8 05/05/2020   PLT 257 05/05/2020   BMET    Component Value Date/Time   NA 134 (L) 05/05/2020 0956   K 4.9 05/05/2020 0956   CL 103 05/05/2020 0956   CO2 22 05/05/2020 0956   GLUCOSE 376 (H) 05/05/2020 0956   BUN 27 (H) 05/05/2020 0956   CREATININE 1.27 (H) 05/05/2020 0956   CALCIUM 9.2 05/05/2020 0956   GFRNONAA 44 (L) 05/05/2020 0956   GFRAA 45 (L) 09/04/2019 1249   Estimated Creatinine Clearance: 38.2 mL/min (A) (by C-G formula based on SCr of 1.27 mg/dL (H)).  COAG Lab Results  Component Value Date   INR 1.2 05/04/2020   INR 1.2 03/11/2018   INR 1.1 03/09/2018   Radiology DG Foot Complete Right  Result Date: 05/04/2020 CLINICAL DATA:  Infection fourth toe RIGHT foot EXAM: RIGHT FOOT COMPLETE - 3+ VIEW COMPARISON:  None FINDINGS: Soft tissue swelling of the toes greatest at fourth toe. Osseous demineralization. Degenerative changes at first MTP joint with hallux valgus and bunion deformity. No acute fracture or dislocation. Deformity at base of proximal phalanx fifth toe likely sequela of remote trauma. IMPRESSION: No definite acute osseous abnormalities. Hallux valgus with bunion  deformity and associated degenerative changes first MTP joint. Probable posttraumatic deformity at base of proximal phalanx fifth toe. Electronically Signed   By: Ulyses Southward M.D.   On: 05/04/2020 16:49   Assessment/Plan Velna Hedgecock is a 75 year old female with medical history significant for peripheral artery disease on Xarelto who states her right fourth toe was injured essential hypertension, type 2 diabetes, CKD 3B, chronic normocytic anemia, who presented to [facility] ED.  1.  Known atherosclerotic disease to the bilateral lower extremity: Patient is well-known to our service as she has required many endovascular and open interventions to restore  arterial patency.  Patient now presents with discoloration to the right fourth toe.  Patient states that she has pretty significant neuropathy and is unable to feel any pain.  In the setting of known atherosclerotic disease, with multiple continued risk factors, new diagnosis of discoloration with possible early gangrenous changes to the right foot the patient undergo repeat angiogram in attempt assess patient's anatomy contributing degree of atherosclerotic disease is appropriate and attempt to revascularize leg can be made at that time.  Procedure, risks and benefits were explained to the patient and her husband who is at the bedside.  All questions were answered.  Patient wishes to proceed.  We will plan on this on Thursday with Dr. Wyn Quaker.  2.  Diabetes: Seems to be uncontrolled Encouraged good control as its slows the progression of atherosclerotic disease  3.  Hyperlipidemia: On aspirin and statin for medical management Encouraged good control as its slows the progression of atherosclerotic disease  4.  Chronic kidney disease: Patient with stage III chronic kidney disease We do use contrast during the angiogram however with the patient's chronic kidney disease we will try to use lowest possible  Discussed with Dr. Weldon Inches, PA-C  05/05/2020 3:46 PM  This note was created with Dragon medical transcription system.  Any error is purely unintentional

## 2020-05-05 NOTE — Plan of Care (Signed)

## 2020-05-05 NOTE — Progress Notes (Addendum)
PROGRESS NOTE    Cheryl Leonard  AOZ:[id number] DOB: Nov 17, 1945 DOA: 05/04/2020 PCP: Danella Penton, MD    Brief Narrative:  Cheryl Leonard is a 75 year old female with past medical history significant for peripheral artery disease on Xarelto followed by vascular surgery outpatient, recent right fourth toe injury followed by podiatry, essential hypertension, type 2 diabetes mellitus, CKD stage IIIb, chronic normocytic anemia who was directed to [facility] ED by her podiatrist, Dr. Excell Seltzer on 5/2 for concern of early gangrene affecting her right fourth toe.  Patient reports injury to that toe several weeks ago and was on oral antibiotics with Keflex over the past 7 days with some improvement initially; although she noted day of admission that her toe was dark in appearance.  Patient denies any pain, but underlying significant peripheral neuropathy.  In the ED, temperature 98.5 F, HR 81, RR 18, BP 145/76, SPO2 97% on room air.  Sodium 136, potassium 5.2, chloride 106, CO2 21, glucose 196, BUN 33, creatinine 1.32, lactic acid 1.8.  WBC 6.8, hemoglobin 8.2, platelets 326.  SARS-CoV-2/COVID-19 test negative.  Right foot with no definite acute osseous abnormality, deformity base proximal phalanx fifth toe likely sequela of remote trauma.  EDP discussed case with podiatry, Dr. Excell Seltzer who requested medical admission with vascular surgery to consult as well.  TRH consulted for further evaluation and management of concern for right fourth toe gangrene.   Assessment & Plan:   Principal Problem:   Gangrene of toe of right foot (HCC) Active Problems:   Iron deficiency anemia   Diabetes mellitus type 2, uncontrolled (HCC)   Hyperlipidemia, mixed   Benign essential hypertension   Peripheral vascular disease, unspecified (HCC)   Acquired hypothyroidism   DM type 2 with diabetic peripheral neuropathy (HCC)   Stage 3b chronic kidney disease (HCC)   Right fourth toe wound with concern for gangrene,  POA Patient presenting from podiatry office with concern of color change to right fourth toe.  Recent trauma and ulceration in webbing treated with Keflex x7 days.  Patient is afebrile, mild leukocytosis with WBC 12.8, normal lactic acid.  X-ray right foot with no acute osseous abnormality but with possible posttraumatic deformity base of proximal phalanx fifth toe. --Vascular surgery consulted --Podiatry following, appreciate assistance --WBC 12.8>5.0 --Blood cultures x2: Pending --Continue ceftriaxone 2 g IV every 24 hours --MR right foot: Pending --Likely will need at minimum right fourth toe amputation, await further recommendations from podiatry/vascular surgery  Hyperkalemia: Resolved Patient presenting with a potassium of 5.2.  Treated with Lokelma 10 mg p.o. x1. --K 5.2>4.9 -- Holding ARB  Peripheral artery disease Patient follows with vascular surgery, Dr. Wyn Quaker outpatient.  Previous bilateral femoral endarterectomy March 2020, stent left SFA September 2020, stent right popliteal/SFA October 2020. On aspirin and Xarelto at home. --Hold Xarelto for possible surgical intervention --Continue Crestor 20 mg p.o. daily  Essential hypertension Home regimen includes metoprolol succinate 50 mg p.o. daily, olmesartan 20 mg p.o. daily, amlodipine 5 mg p.o. daily  Type 2 diabetes mellitus Hemoglobin A1c 6.3 on 05/04/2020, well controlled.  On metformin 500 mg p.o. twice daily, Tresiba 30 units subcutaneously nightly at home. --Hold metformin while inpatient --Lantus 25 units subcutaneously nightly --NovoLog 3 units 3 times daily AC --SSI for further coverage --CBGs before every meal/at bedtime  Diabetic polyneuropathy --Gabapentin 100 mg p.o. daily  CKD stage IIIb Creatinine 1.32, at baseline. --Avoid nephrotoxins, renally dose all medications -- Follow BMP daily  Chronic normocytic anemia Hemoglobin 8.2, MCV 89. --  CBC daily  Hypothyroidism -- Levothyroxine 100 mcg p.o.  daily  Hyperlipidemia --Crestor 20 mg p.o. daily  Anxiety/depression -- Cymbalta 30 mg p.o. daily -- Xanax 0.25 mg p.o. nightly as needed  GERD: Pepcid 40 mg p.o. twice daily   DVT prophylaxis: Lovenox   Code Status: Full Code Family Communication: Updated husband present at bedside, and daughter on speaker phone.  Disposition Plan:  Level of care: Med-Surg Status is: Inpatient  Remains inpatient appropriate because:Ongoing diagnostic testing needed not appropriate for outpatient work up, Unsafe d/c plan, IV treatments appropriate due to intensity of illness or inability to take PO and Inpatient level of care appropriate due to severity of illness   Dispo:  Patient From: Home  Planned Disposition: Home  Medically stable for discharge: No      Consultants:   Vascular surgery  Podiatry  Procedures:   None  Antimicrobials:   Ceftriaxone 5/2>>   Subjective: Patient seen and examined bedside, resting comfortably.  Husband present.  Daughter on speaker phone.  No specific complaints other than concern for discoloration of right fourth toe.  Patient reports was sent in by podiatry for concern of gangrene.  Objective: Vitals:   05/04/20 2026 05/05/20 0009 05/05/20 0553 05/05/20 0851  BP: (!) 161/71 (!) 158/139 (!) 146/59 (!) 152/59  Pulse: 70 72 72 84  Resp: 17 17 16 18   Temp: 98 F (36.7 C) 98.7 F (37.1 C) 98.6 F (37 C) 97.8 F (36.6 C)  TempSrc: Oral Oral Oral Oral  SpO2: 97%  99% 100%  Weight:      Height:       No intake or output data in the 24 hours ending 05/05/20 1231 Filed Weights   05/04/20 1548  Weight: 72.6 kg    Examination:  General exam: Appears calm and comfortable  Respiratory system: Clear to auscultation. Respiratory effort normal.  On room air Cardiovascular system: S1 & S2 heard, RRR. No JVD, murmurs, rubs, gallops or clicks. No pedal edema.  Noted port right chest which is accessed Gastrointestinal system: Abdomen is  nondistended, soft and nontender. No organomegaly or masses felt. Normal bowel sounds heard. Central nervous system: Alert and oriented.  Decreased sensation to dorsal/plantar surface bilateral feet, otherwise unremarkable neurological exam Extremities: Symmetric 5 x 5 power. Skin: Noted a bluish discoloration to right fourth foot phalanx encompassing plantar surface with ulceration between fourth/fifth toe webbing Psychiatry: Judgement and insight appear normal. Mood & affect appropriate.     Data Reviewed: I have personally reviewed following labs and imaging studies  CBC: Recent Labs  Lab 05/04/20 1553 05/05/20 0956  WBC 6.8 5.0  NEUTROABS 4.4  --   HGB 8.2* 7.4*  HCT 26.0* 23.1*  MCV 89.0 86.8  PLT 326 257   Basic Metabolic Panel: Recent Labs  Lab 05/04/20 1553 05/05/20 0956  NA 136 134*  K 5.2* 4.9  CL 106 103  CO2 21* 22  GLUCOSE 196* 376*  BUN 33* 27*  CREATININE 1.32* 1.27*  CALCIUM 9.5 9.2  MG  --  1.8  PHOS  --  3.7   GFR: Estimated Creatinine Clearance: 38.2 mL/min (A) (by C-G formula based on SCr of 1.27 mg/dL (H)). Liver Function Tests: No results for input(s): AST, ALT, ALKPHOS, BILITOT, PROT, ALBUMIN in the last 168 hours. No results for input(s): LIPASE, AMYLASE in the last 168 hours. No results for input(s): AMMONIA in the last 168 hours. Coagulation Profile: Recent Labs  Lab 05/04/20 1553  INR 1.2  Cardiac Enzymes: No results for input(s): CKTOTAL, CKMB, CKMBINDEX, TROPONINI in the last 168 hours. BNP (last 3 results) No results for input(s): PROBNP in the last 8760 hours. HbA1C: Recent Labs    05/04/20 2106  HGBA1C 6.3*   CBG: Recent Labs  Lab 05/04/20 2223 05/05/20 0853 05/05/20 1212  GLUCAP 105* 243* 300*   Lipid Profile: No results for input(s): CHOL, HDL, LDLCALC, TRIG, CHOLHDL, LDLDIRECT in the last 72 hours. Thyroid Function Tests: No results for input(s): TSH, T4TOTAL, FREET4, T3FREE, THYROIDAB in the last 72  hours. Anemia Panel: No results for input(s): VITAMINB12, FOLATE, FERRITIN, TIBC, IRON, RETICCTPCT in the last 72 hours. Sepsis Labs: Recent Labs  Lab 05/04/20 1553  LATICACIDVEN 1.8    Recent Results (from the past 240 hour(s))  Microscopic Examination     Status: None   Collection Time: 04/27/20  9:24 AM   Urine  Result Value Ref Range Status   WBC, UA 0-5 0 - 5 /hpf Final   RBC 0-2 0 - 2 /hpf Final   Epithelial Cells (non renal) 0-10 0 - 10 /hpf Final   Bacteria, UA None seen None seen/Few Final  SARS CORONAVIRUS 2 (TAT 6-24 HRS) Nasopharyngeal Nasopharyngeal Swab     Status: None   Collection Time: 05/04/20  5:59 PM   Specimen: Nasopharyngeal Swab  Result Value Ref Range Status   SARS Coronavirus 2 NEGATIVE NEGATIVE Final    Comment: (NOTE) SARS-CoV-2 target nucleic acids are NOT DETECTED.  The SARS-CoV-2 RNA is generally detectable in upper and lower respiratory specimens during the acute phase of infection. Negative results do not preclude SARS-CoV-2 infection, do not rule out co-infections with other pathogens, and should not be used as the sole basis for treatment or other patient management decisions. Negative results must be combined with clinical observations, patient history, and epidemiological information. The expected result is Negative.  Fact Sheet for Patients: HairSlick.no  Fact Sheet for Healthcare Providers: quierodirigir.com  This test is not yet approved or cleared by the [place] FDA and  has been authorized for detection and/or diagnosis of SARS-CoV-2 by FDA under an Emergency Use Authorization (EUA). This EUA will remain  in effect (meaning this test can be used) for the duration of the COVID-19 declaration under Se ction 564(b)(1) of the Act, 21 U.S.C. section 360bbb-3(b)(1), unless the authorization is terminated or revoked sooner.  Performed at [facility], [address], [place], [place] [address]   CULTURE, BLOOD (ROUTINE X 2) w Reflex to ID Panel     Status: None (Preliminary result)   Collection Time: 05/04/20  9:06 PM   Specimen: BLOOD  Result Value Ref Range Status   Specimen Description BLOOD BLOOD LEFT HAND  Final   Special Requests   Final    BOTTLES DRAWN AEROBIC AND ANAEROBIC Blood Culture adequate volume   Culture   Final    NO GROWTH < 12 HOURS Performed at [facility], [address], [place], [place] [address]    Report Status PENDING  Incomplete  CULTURE, BLOOD (ROUTINE X 2) w Reflex to ID Panel     Status: None (Preliminary result)   Collection Time: 05/04/20  9:06 PM   Specimen: BLOOD  Result Value Ref Range Status   Specimen Description BLOOD BLOOD LEFT FOREARM  Final   Special Requests   Final    BOTTLES DRAWN AEROBIC AND ANAEROBIC Blood Culture adequate volume   Culture   Final    NO GROWTH <  12 HOURS Performed at [facility], [address] [place], [place] [address]    Report Status PENDING  Incomplete         Radiology Studies: DG Foot Complete Right  Result Date: 05/04/2020 CLINICAL DATA:  Infection fourth toe RIGHT foot EXAM: RIGHT FOOT COMPLETE - 3+ VIEW COMPARISON:  None FINDINGS: Soft tissue swelling of the toes greatest at fourth toe. Osseous demineralization. Degenerative changes at first MTP joint with hallux valgus and bunion deformity. No acute fracture or dislocation. Deformity at base of proximal phalanx fifth toe likely sequela of remote trauma. IMPRESSION: No definite acute osseous abnormalities. Hallux valgus with bunion deformity and associated degenerative changes first MTP joint. Probable posttraumatic deformity at base of proximal phalanx fifth toe. Electronically Signed   By: Ulyses Southward M.D.   On: 05/04/2020 16:49        Scheduled Meds: . amLODipine  5 mg Oral Daily  . Chlorhexidine Gluconate Cloth  6 each Topical Daily  . DULoxetine  30 mg Oral Daily  . enoxaparin  (LOVENOX) injection  40 mg Subcutaneous Q24H  . famotidine  40 mg Oral BID  . gabapentin  100 mg Oral Daily  . insulin aspart  0-5 Units Subcutaneous QHS  . insulin aspart  0-9 Units Subcutaneous TID WC  . insulin aspart  3 Units Subcutaneous TID WC  . insulin glargine  25 Units Subcutaneous QHS  . levothyroxine  100 mcg Oral QAC breakfast  . metoprolol succinate  50 mg Oral Daily  . pantoprazole  40 mg Oral BH-q7a  . rosuvastatin  20 mg Oral BH-q7a   Continuous Infusions: . cefTRIAXone (ROCEPHIN)  IV 2 g (05/04/20 2350)  . lactated ringers 50 mL/hr at 05/05/20 0110     LOS: 1 day    Time spent: 38 minutes spent on chart review, discussion with nursing staff, consultants, updating family and interview/physical exam; more than 50% of that time was spent in counseling and/or coordination of care.    Alvira Philips [place], DO [facility] Available via Epic secure chat 7am-7pm After these hours, please refer to coverage provider listed on [email or web address] 05/05/2020, 12:31 PM

## 2020-05-05 NOTE — Consult Note (Signed)
ORTHOPAEDIC CONSULTATION  REQUESTING PHYSICIAN: [place], Eric J, DO  Chief Complaint: Right fourth toe gangrene  HPI: Cheryl Leonard is a 75 y.o. female who complains of worsening wound infection to the right fourth toe.  Seen in the outpatient clinic by Dr. Excell Seltzer yesterday.  Had noted worsening gangrenous changes of the fourth toe with dusky discoloration plantarly.  Admitted for IV antibiotics likely amputation of the fourth toe and vascular consultation.  Patient without complaint today.  Doing well.  History of neuropathy with diabetes.  Past Medical History:  Diagnosis Date  . Anemia   . Anxiety   . Arthritis    Gout  . Cataracts, both eyes   . Diabetic retinopathy (HCC)    NPDR OU  . Diabetic retinopathy (HCC)   . GERD (gastroesophageal reflux disease)   . Gout   . Headache    h/o migraines  . History of fracture of patella    right knee  . History of positive PPD    Patient always shows positive  . Hyperlipidemia   . Hypertension   . Hypertensive retinopathy    OU  . Hypothyroidism   . Lichen sclerosus 12/30/2013   of vulva  . Metatarsal fracture   . Neuropathy   . Peripheral vascular disease (HCC)   . Polyneuropathy    numbness and tingling in feet and toes  . Renal insufficiency    Stage 3  . Sleep apnea    does not use cpap-lost weight   . Type 2 diabetes mellitus, uncontrolled (HCC)    Past Surgical History:  Procedure Laterality Date  . ABDOMINAL HYSTERECTOMY    . APPENDECTOMY    . BREAST REDUCTION SURGERY  2001  . CATARACT EXTRACTION    . CESAREAN SECTION  1976  . COLONOSCOPY  03/05/2013   Nml - due for repeat 03/06/2018  . COLONOSCOPY WITH PROPOFOL N/A 03/18/2019   Procedure: COLONOSCOPY WITH PROPOFOL;  Surgeon: Toledo, [name], MD;  Location: [facility];  Service: Gastroenterology;  Laterality: N/A;  . DIAGNOSTIC LAPAROSCOPY    . DILATION AND CURETTAGE OF UTERUS  1989  . ENDARTERECTOMY FEMORAL Bilateral 03/09/2018   Procedure:  ENDARTERECTOMY FEMORAL;  Surgeon: Annice Needy, MD;  Location: [facility];  Service: Vascular;  Laterality: Bilateral;  . ENDARTERECTOMY POPLITEAL Left 03/09/2018   Procedure: ENDARTERECTOMY POPLITEAL AND SFA;  Surgeon: Annice Needy, MD;  Location: [facility];  Service: Vascular;  Laterality: Left;  . ESOPHAGOGASTRODUODENOSCOPY  03/05/2013  . ESOPHAGOGASTRODUODENOSCOPY (EGD) WITH PROPOFOL N/A 03/18/2019   Procedure: ESOPHAGOGASTRODUODENOSCOPY (EGD) WITH PROPOFOL;  Surgeon: Toledo, [name], MD;  Location: [facility];  Service: Gastroenterology;  Laterality: N/A;  . EYE SURGERY    . Eyelid Surgery  2012  . INTRAMEDULLARY (IM) NAIL INTERTROCHANTERIC Left 10/30/2015   Procedure: INTRAMEDULLARY (IM) NAIL INTERTROCHANTRIC ;  Surgeon: Kennedy Bucker, MD;  Location: [facility];  Service: Orthopedics;  Laterality: Left;  . KYPHOPLASTY N/A 10/25/2018   Procedure: L4 KYPHOPLASTY;  Surgeon: Kennedy Bucker, MD;  Location: [facility];  Service: Orthopedics;  Laterality: N/A;  . LAPAROSCOPIC HYSTERECTOMY  2000   total  . LOWER EXTREMITY ANGIOGRAPHY Left 03/08/2017   Procedure: LOWER EXTREMITY ANGIOGRAPHY;  Surgeon: Annice Needy, MD;  Location: [facility];  Service: Cardiovascular;  Laterality: Left;  . LOWER EXTREMITY ANGIOGRAPHY Left 10/30/2017   Procedure: LOWER EXTREMITY ANGIOGRAPHY;  Surgeon: Annice Needy, MD;  Location: [facility];  Service: Cardiovascular;  Laterality: Left;  . LOWER EXTREMITY ANGIOGRAPHY Right  03/08/2018   Procedure: LOWER EXTREMITY ANGIOGRAPHY;  Surgeon: Annice Needyew, Jason S, MD;  Location: [facility];  Service: Cardiovascular;  Laterality: Right;  . LOWER EXTREMITY ANGIOGRAPHY Left 10/01/2018   Procedure: LOWER EXTREMITY ANGIOGRAPHY;  Surgeon: Annice Needyew, Jason S, MD;  Location: [facility];  Service: Cardiovascular;  Laterality: Left;  . LOWER EXTREMITY ANGIOGRAPHY Right 10/08/2018   Procedure: LOWER EXTREMITY ANGIOGRAPHY;  Surgeon: Annice Needyew, Jason S, MD;  Location: [facility];  Service: Cardiovascular;  Laterality: Right;  . PERIPHERAL VASCULAR INTERVENTION  03/08/2018   Procedure: PERIPHERAL VASCULAR INTERVENTION;  Surgeon: Annice Needyew, Jason S, MD;  Location: [facility];  Service: Cardiovascular;;  . PORTA CATH INSERTION N/A 02/17/2020   Procedure: PORTA CATH INSERTION;  Surgeon: Annice Needyew, Jason S, MD;  Location: [facility];  Service: Cardiovascular;  Laterality: N/A;  . REDUCTION MAMMAPLASTY  1997  . SACROPLASTY N/A 10/25/2018   Procedure: S1 SACROPLASTY;  Surgeon: Kennedy BuckerMenz, Michael, MD;  Location: [facility];  Service: Orthopedics;  Laterality: N/A;   Social History   Socioeconomic History  . Marital status: Married    Spouse name: Jonny RuizJohn  . Number of children: 3  . Years of education: Not on file  . Highest education level: Not on file  Occupational History  . Occupation: Barrister's clerklibrary assistanty  Tobacco Use  . Smoking status: Former Smoker    Packs/day: 1.00    Years: 20.00    Pack years: 20.00    Types: Cigarettes    Quit date: 03/07/1996    Years since quitting: 24.1  . Smokeless tobacco: Never Used  . Tobacco comment: started smoking at age 75  Vaping Use  . Vaping Use: Never used  Substance and Sexual Activity  . Alcohol use: No    Alcohol/week: 0.0 standard drinks  . Drug use: No  . Sexual activity: Yes    Partners: Male    Birth control/protection: Surgical, Post-menopausal  Other Topics Concern  . Not on file  Social History Narrative   [place] in [place]; with husband; quit > 20 years; no alcohol; used to work at [employer] [place] [employer].    Social Determinants of Health   Financial Resource Strain: Not on file  Food Insecurity: Not on file  Transportation Needs: Not on file  Physical Activity: Not on file  Stress: Not on file  Social Connections: Not on file   Family History  Problem Relation Age of Onset  . Coronary artery disease Father   . Heart attack Father   . Coronary artery disease Mother   . Heart attack  Mother   . Ovarian cancer Sister [id number]       sister had hormonal therapy for IVF txs-which increased risk factor for ovarian cancer  . Breast cancer Neg Hx    Allergies  Allergen Reactions  . Ace Inhibitors Other (See Comments)   Prior to Admission medications   Medication Sig Start Date End Date Taking? Authorizing Provider  ALPRAZolam Prudy Feeler(XANAX) 0.25 MG tablet Take 0.25 mg by mouth daily as needed for anxiety or sleep.  05/07/18  Yes [provider]  amLODipine (NORVASC) 5 MG tablet Take 5 mg by mouth daily.   Yes [provider]  aspirin EC 81 MG tablet Take 81 mg by mouth daily.    Yes [provider]  cephALEXin (KEFLEX) 500 MG capsule Take 500 mg by mouth 3 (three) times daily. 04/27/20  Yes [provider]  cholecalciferol (VITAMIN D) 1000 units tablet Take  1,000 Units by mouth 2 (two) times daily.   Yes [provider]  CORAL CALCIUM PO Take 1 tablet by mouth 2 (two) times daily.    Yes [provider]  denosumab (PROLIA) 60 MG/ML SOLN injection Inject 60 mg into the skin every 6 (six) months.  03/15/17  Yes [provider]  DULoxetine (CYMBALTA) 30 MG capsule Take 30 mg by mouth daily. 01/02/20  Yes [provider]  estradiol (ESTRACE) 0.1 MG/GM vaginal cream Apply one pea-sized amount around the opening of the urethra daily for two weeks, then three times weekly thereafter. 04/27/20  Yes Vaillancourt, Samantha, PA-C  famotidine (PEPCID) 40 MG tablet Take 40 mg by mouth daily. 09/30/18  Yes [provider]  furosemide (LASIX) 20 MG tablet Take 20 mg by mouth daily as needed for fluid or edema.  05/07/18 09/04/26 Yes [provider]  gabapentin (NEURONTIN) 100 MG capsule Take 1 capsule by mouth 2 (two) times daily. 03/04/19  Yes [provider]  levothyroxine (SYNTHROID, LEVOTHROID) 100 MCG tablet Take 100 mcg by mouth daily before breakfast.  08/03/14  Yes [provider]  lidocaine-prilocaine  (EMLA) cream Apply 1 application topically as needed (apply prior to port a cath access). 04/08/20  Yes Earna Coder, MD  Magnesium 500 MG TABS Take 500 mg by mouth every morning.    Yes [provider]  metFORMIN (GLUCOPHAGE) 1000 MG tablet Take 1 tablet by mouth 2 (two) times daily with a meal. 01/17/20  Yes [provider]  metoprolol succinate (TOPROL-XL) 50 MG 24 hr tablet Take 50 mg by mouth daily. 12/04/18 05/04/20 Yes [provider]  mirabegron ER (MYRBETRIQ) 50 MG TB24 tablet Take 1 tablet (50 mg total) by mouth daily. 04/27/20  Yes Vaillancourt, Samantha, PA-C  mirtazapine (REMERON) 15 MG tablet Take 15 mg by mouth at bedtime. 02/20/19 05/04/20 Yes [provider]  Multiple Vitamin (MULTIVITAMIN WITH MINERALS) TABS tablet Take 1 tablet by mouth daily.   Yes [provider]  olmesartan (BENICAR) 20 MG tablet Take 20 mg by mouth daily.   Yes [provider]  pantoprazole (PROTONIX) 40 MG tablet Take 40 mg by mouth every morning.    Yes [provider]  rivaroxaban (XARELTO) 20 MG TABS tablet Take 1 tablet (20 mg total) by mouth daily with supper. 12/31/19  Yes Georgiana Spinner, NP  rosuvastatin (CRESTOR) 20 MG tablet Take 20 mg by mouth every morning.  03/19/18 09/04/26 Yes [provider]  TRESIBA FLEXTOUCH 200 UNIT/ML SOPN Inject 30 Units as directed at bedtime.  11/15/16  Yes [provider]  vitamin B-12 (CYANOCOBALAMIN) 100 MCG tablet Take 100 mcg by mouth daily.   Yes [provider]  vitamin E 400 UNIT capsule Take 400 Units by mouth daily.   Yes [provider]  zolpidem (AMBIEN) 5 MG tablet Take 1 tablet (5 mg total) by mouth at bedtime. Patient taking differently: Take 10 mg by mouth at bedtime as needed. 03/12/18  Yes [name], Cala Bradford A, PA-C  Insulin Pen Needle 31G X 5 MM MISC once daily. 07/04/16   [provider]  mupirocin cream (BACTROBAN) 2 % Apply topically 2 (two) times  daily. Patient not taking: No sig reported 03/17/20   [provider]  nitrofurantoin (MACRODANTIN) 100 MG capsule Take by mouth. Patient not taking: No sig reported 03/24/20   [provider]  nystatin cream (MYCOSTATIN) Apply 1 application topically 2 (two) times daily. Patient not taking: No sig  reported 04/07/20 04/07/21  [provider]   DG Foot Complete Right  Result Date: 05/04/2020 CLINICAL DATA:  Infection fourth toe RIGHT foot EXAM: RIGHT FOOT COMPLETE - 3+ VIEW COMPARISON:  None FINDINGS: Soft tissue swelling of the toes greatest at fourth toe. Osseous demineralization. Degenerative changes at first MTP joint with hallux valgus and bunion deformity. No acute fracture or dislocation. Deformity at base of proximal phalanx fifth toe likely sequela of remote trauma. IMPRESSION: No definite acute osseous abnormalities. Hallux valgus with bunion deformity and associated degenerative changes first MTP joint. Probable posttraumatic deformity at base of proximal phalanx fifth toe. Electronically Signed   By: Ulyses Southward M.D.   On: 05/04/2020 16:49    Positive ROS: All other systems have been reviewed and were otherwise negative with the exception of those mentioned in the HPI and as above.  12 point ROS was performed.  Physical Exam: General: Alert and oriented.  No apparent distress.  Vascular:  Left foot:Dorsalis Pedis:  diminished Posterior Tibial:  diminished  Right foot: Dorsalis Pedis:  diminished Posterior Tibial:  diminished I was definitely able to palpate a DP and PT pulse on the right foot.  Neuro:absent protective sensation  Derm: Right fourth toe laterally there is a full-thickness ulcer all the way down to the level of bone at the PIPJ.  The fourth toe has somewhat of a dusky appearance and there is also a noted dusky appearance to the plantar aspect of the fourth toe at the level of the metatarsophalangeal joint region.  There is no proximal lymphangitic  streaking.  Serous serosanguineous and scant purulent drainage laterally at the PIPJ of the fourth toe.  Ortho/MS: Focal edema to the right fourth toe is noted.  X-ray is fairly benign.  No signs of erosive changes.  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  I suspect at least she will need a fourth toe amputation.  We are awaiting vascular evaluation and likely intervention.  Though I can palpate pulses she does have dusky discoloration especially around the plantar aspect of the fourth toe.  I ordered a postop shoe for right foot today and a new dressing was applied.  Podiatry will continue to follow.     Irean Hong, DPM Cell [phone]   05/05/2020 12:49 PM

## 2020-05-05 NOTE — Progress Notes (Signed)
OT Cancellation Note  Patient Details Name: Cheryl Leonard MRN: [id number] DOB: 01/11/1945   Cancelled Treatment:    Reason Eval/Treat Not Completed: Other (comment). Consult received and chart reviewed. Pending consults for podiatry and vascular to consider possible amputation. Per discussion with primary MD, will hold until consults complete and plan in place for patient.  Will continue to follow and initiate as appropriate.  Wynona Canes, MPH, MS, OTR/L ascom [phone] 05/05/20, 10:05 AM

## 2020-05-05 NOTE — Progress Notes (Signed)
Inpatient Diabetes Program Recommendations  AACE/ADA: New Consensus Statement on Inpatient Glycemic Control (2015)  Target Ranges:  Prepandial:   less than 140 mg/dL      Peak postprandial:   less than 180 mg/dL (1-2 hours)      Critically ill patients:  140 - 180 mg/dL   Lab Results  Component Value Date   GLUCAP 300 (H) 05/05/2020   HGBA1C 6.3 (H) 05/04/2020    Review of Glycemic Control Results for AYSIAH, JURADO (MRN [id number]) as of 05/05/2020 12:22  Ref. Range 05/04/2020 22:23 05/05/2020 08:53 05/05/2020 12:12  Glucose-Capillary Latest Ref Range: 70 - 99 mg/dL 562 (H) 130 (H) 865 (H)   Diabetes history: DM 2 Outpatient Diabetes medications:  Tresiba 30 units q HS, Metformin 1000 mg bid Current orders for Inpatient glycemic control:  Novolog moderate tid with meals and HS Inpatient Diabetes Program Recommendations:   Please add Lantus 25 units q HS and add Novolog 3 units tid with meals.   Thanks,  Beryl Meager, RN, BC-ADM Inpatient Diabetes Coordinator Pager [phone] (8a-5p)

## 2020-05-05 NOTE — Progress Notes (Signed)
PT Cancellation Note  Patient Details Name: Cheryl Leonard MRN: [id number] DOB: [birth year]   Cancelled Treatment:    Reason Eval/Treat Not Completed:  (Consult received and chart reviewed. Pending consults for podiatry and vascular to consider possible amputation. Per discussion with primary MD, will hold until consults complete and plan in place for patient.  Will continue to follow and initiate as appropriate.)  [name] H. Manson Passey, PT, DPT, NCS 05/05/20, 9:32 AM [phone]

## 2020-05-05 NOTE — H&P (View-Only) (Signed)
[facility] Vascular Consult Note  MRN : [id number]  Cheryl Leonard is a 75 y.o. ([birth year]) female who presents with chief complaint of  Chief Complaint  Patient presents with  . Toe Pain   History of Present Illness:  Cheryl Leonard is a 75 year old female with medical history significant for peripheral artery disease on Xarelto who states her right fourth toe was injured essential hypertension, type 2 diabetes, CKD 3B, chronic normocytic anemia, who presented to [facility] ED.   Patient initially had an injury to right toe. Has been on p.o. antibiotics Keflex for the past 7 days with some improvement however this morning after waking up she noted that her right fourth toe was bluish in appearance.  No pain due to her polyneuropathy.  She presented to her podiatrist this morning who recommended that she come to the ED for possible amputation of her toe.  EDP contacted podiatry Dr. Excell Seltzer who recommended admission.    ED Course:   Afebrile.  BP 145/76, pulse 81, respiratory rate 18, O2 saturation 97% on room air.  Lab studies remarkable for serum potassium 5.2, serum bicarb 21, serum glucose 196, BUN 33, creatinine 1.32, GFR 42.  Vascular surgery was consulted by Dr. Excell Seltzer for possible endovascular intervention.  Current Facility-Administered Medications  Medication Dose Route Frequency Provider Last Rate Last Admin  . acetaminophen (TYLENOL) tablet 650 mg  650 mg Oral Q6H PRN Darlin Drop, DO      . ALPRAZolam Prudy Feeler) tablet 0.25 mg  0.25 mg Oral Daily PRN Dow Adolph N, DO      . amLODipine (NORVASC) tablet 5 mg  5 mg Oral Daily [place], Carole N, DO   5 mg at 05/05/20 4709  . cefTRIAXone (ROCEPHIN) 2 g in sodium chloride 0.9 % 100 mL IVPB  2 g Intravenous Q24H Dow Adolph N, DO 200 mL/hr at 05/04/20 2350 2 g at 05/04/20 2350  . Chlorhexidine Gluconate Cloth 2 % PADS 6 each  6 each Topical Daily [place], Eric J, DO   6 each at 05/05/20 6283  . DULoxetine  (CYMBALTA) DR capsule 30 mg  30 mg Oral Daily Dow Adolph N, DO   30 mg at 05/05/20 6629  . enoxaparin (LOVENOX) injection 40 mg  40 mg Subcutaneous Q24H Dow Adolph N, DO   40 mg at 05/04/20 2339  . famotidine (PEPCID) tablet 40 mg  40 mg Oral BID Dow Adolph N, DO   40 mg at 05/05/20 4765  . gabapentin (NEURONTIN) capsule 100 mg  100 mg Oral Daily Dow Adolph N, DO   100 mg at 05/05/20 4650  . hydrALAZINE (APRESOLINE) injection 5 mg  5 mg Intravenous Q6H PRN Dow Adolph N, DO      . insulin aspart (novoLOG) injection 0-5 Units  0-5 Units Subcutaneous QHS [place], Carole N, DO      . insulin aspart (novoLOG) injection 0-9 Units  0-9 Units Subcutaneous TID WC Dow Adolph N, DO   5 Units at 05/05/20 1243  . insulin aspart (novoLOG) injection 3 Units  3 Units Subcutaneous TID WC [place], Eric J, DO      . insulin glargine (LANTUS) injection 25 Units  25 Units Subcutaneous QHS [place], Eric J, DO      . lactated ringers infusion   Intravenous Continuous Darlin Drop, DO 50 mL/hr at 05/05/20 1500 Infusion Verify at 05/05/20 1500  . levothyroxine (SYNTHROID) tablet 100 mcg  100 mcg Oral QAC breakfast Dow Adolph  N, DO   100 mcg at 05/05/20 6213  . melatonin tablet 2.5 mg  2.5 mg Oral QHS PRN Dow Adolph N, DO   2.5 mg at 05/05/20 0113  . metoprolol succinate (TOPROL-XL) 24 hr tablet 50 mg  50 mg Oral Daily Dow Adolph N, DO   50 mg at 05/05/20 0865  . ondansetron (ZOFRAN) injection 4 mg  4 mg Intravenous Q6H PRN Dow Adolph N, DO      . pantoprazole (PROTONIX) EC tablet 40 mg  40 mg Oral [address] [place] N, DO   40 mg at 05/05/20 7846  . rosuvastatin (CRESTOR) tablet 20 mg  20 mg Oral [address] [place] N, DO   20 mg at 05/05/20 9629   Past Medical History:  Diagnosis Date  . Anemia   . Anxiety   . Arthritis    Gout  . Cataracts, both eyes   . Diabetic retinopathy (HCC)    NPDR OU  . Diabetic retinopathy (HCC)   . GERD (gastroesophageal reflux disease)   . Gout   . Headache     h/o migraines  . History of fracture of patella    right knee  . History of positive PPD    Patient always shows positive  . Hyperlipidemia   . Hypertension   . Hypertensive retinopathy    OU  . Hypothyroidism   . Lichen sclerosus 12/30/2013   of vulva  . Metatarsal fracture   . Neuropathy   . Peripheral vascular disease (HCC)   . Polyneuropathy    numbness and tingling in feet and toes  . Renal insufficiency    Stage 3  . Sleep apnea    does not use cpap-lost weight   . Type 2 diabetes mellitus, uncontrolled (HCC)    Past Surgical History:  Procedure Laterality Date  . ABDOMINAL HYSTERECTOMY    . APPENDECTOMY    . BREAST REDUCTION SURGERY  2001  . CATARACT EXTRACTION    . CESAREAN SECTION  1976  . COLONOSCOPY  03/05/2013   Nml - due for repeat 03/06/2018  . COLONOSCOPY WITH PROPOFOL N/A 03/18/2019   Procedure: COLONOSCOPY WITH PROPOFOL;  Surgeon: Toledo, [name], MD;  Location: [facility];  Service: Gastroenterology;  Laterality: N/A;  . DIAGNOSTIC LAPAROSCOPY    . DILATION AND CURETTAGE OF UTERUS  1989  . ENDARTERECTOMY FEMORAL Bilateral 03/09/2018   Procedure: ENDARTERECTOMY FEMORAL;  Surgeon: Annice Needy, MD;  Location: [facility];  Service: Vascular;  Laterality: Bilateral;  . ENDARTERECTOMY POPLITEAL Left 03/09/2018   Procedure: ENDARTERECTOMY POPLITEAL AND SFA;  Surgeon: Annice Needy, MD;  Location: [facility];  Service: Vascular;  Laterality: Left;  . ESOPHAGOGASTRODUODENOSCOPY  03/05/2013  . ESOPHAGOGASTRODUODENOSCOPY (EGD) WITH PROPOFOL N/A 03/18/2019   Procedure: ESOPHAGOGASTRODUODENOSCOPY (EGD) WITH PROPOFOL;  Surgeon: Toledo, [name], MD;  Location: [facility];  Service: Gastroenterology;  Laterality: N/A;  . EYE SURGERY    . Eyelid Surgery  2012  . INTRAMEDULLARY (IM) NAIL INTERTROCHANTERIC Left 10/30/2015   Procedure: INTRAMEDULLARY (IM) NAIL INTERTROCHANTRIC ;  Surgeon: Kennedy Bucker, MD;  Location: [facility];  Service: Orthopedics;  Laterality: Left;  .  KYPHOPLASTY N/A 10/25/2018   Procedure: L4 KYPHOPLASTY;  Surgeon: Kennedy Bucker, MD;  Location: [facility];  Service: Orthopedics;  Laterality: N/A;  . LAPAROSCOPIC HYSTERECTOMY  2000   total  . LOWER EXTREMITY ANGIOGRAPHY Left 03/08/2017   Procedure: LOWER EXTREMITY ANGIOGRAPHY;  Surgeon: Annice Needy, MD;  Location: [facility];  Service: Cardiovascular;  Laterality: Left;  . LOWER EXTREMITY ANGIOGRAPHY Left 10/30/2017   Procedure: LOWER EXTREMITY ANGIOGRAPHY;  Surgeon: Annice Needy, MD;  Location: [facility];  Service: Cardiovascular;  Laterality: Left;  . LOWER EXTREMITY ANGIOGRAPHY Right 03/08/2018   Procedure: LOWER EXTREMITY ANGIOGRAPHY;  Surgeon: Annice Needy, MD;  Location: [facility];  Service: Cardiovascular;  Laterality: Right;  . LOWER EXTREMITY ANGIOGRAPHY Left 10/01/2018   Procedure: LOWER EXTREMITY ANGIOGRAPHY;  Surgeon: Annice Needy, MD;  Location: [facility];  Service: Cardiovascular;  Laterality: Left;  . LOWER EXTREMITY ANGIOGRAPHY Right 10/08/2018   Procedure: LOWER EXTREMITY ANGIOGRAPHY;  Surgeon: Annice Needy, MD;  Location: [facility];  Service: Cardiovascular;  Laterality: Right;  . PERIPHERAL VASCULAR INTERVENTION  03/08/2018   Procedure: PERIPHERAL VASCULAR INTERVENTION;  Surgeon: Annice Needy, MD;  Location: [facility];  Service: Cardiovascular;;  . PORTA CATH INSERTION N/A 02/17/2020   Procedure: PORTA CATH INSERTION;  Surgeon: Annice Needy, MD;  Location: [facility];  Service: Cardiovascular;  Laterality: N/A;  . REDUCTION MAMMAPLASTY  1997  . SACROPLASTY N/A 10/25/2018   Procedure: S1 SACROPLASTY;  Surgeon: Kennedy Bucker, MD;  Location: [facility];  Service: Orthopedics;  Laterality: N/A;   Social History Social History   Tobacco Use  . Smoking status: Former Smoker    Packs/day: 1.00    Years: 20.00    Pack years: 20.00    Types: Cigarettes    Quit date: 03/07/1996    Years since quitting: 24.1  .  Smokeless tobacco: Never Used  . Tobacco comment: started smoking at age 24  Vaping Use  . Vaping Use: Never used  Substance Use Topics  . Alcohol use: No    Alcohol/week: 0.0 standard drinks  . Drug use: No   Family History Family History  Problem Relation Age of Onset  . Coronary artery disease Father   . Heart attack Father   . Coronary artery disease Mother   . Heart attack Mother   . Ovarian cancer Sister 40       sister had hormonal therapy for IVF txs-which increased risk factor for ovarian cancer  . Breast cancer Neg Hx   Denies family history of peripheral artery disease, venous disease or renal disease.  Allergies  Allergen Reactions  . Ace Inhibitors Other (See Comments)   REVIEW OF SYSTEMS (Negative unless checked)  Constitutional: [] Weight loss  [] Fever  [] Chills Cardiac: [] Chest pain   [] Chest pressure   [] Palpitations   [] Shortness of breath when laying flat   [] Shortness of breath at rest   [] Shortness of breath with exertion. Vascular:  [] Pain in legs with walking   [] Pain in legs at rest   [] Pain in legs when laying flat   [] Claudication   [] Pain in feet when walking  [] Pain in feet at rest  [] Pain in feet when laying flat   [] History of DVT   [] Phlebitis   [] Swelling in legs   [] Varicose veins   [] Non-healing ulcers Pulmonary:   [] Uses home oxygen   [] Productive cough   [] Hemoptysis   [] Wheeze  [] COPD   [] Asthma Neurologic:  [] Dizziness  [] Blackouts   [] Seizures   [] History of stroke   [] History of TIA  [] Aphasia   [] Temporary blindness   [] Dysphagia   [] Weakness or numbness in arms   [] Weakness or numbness in legs Musculoskeletal:  [x] Arthritis   [] Joint swelling   [] Joint pain   [] Low back pain Hematologic:  []   Easy bruising  [] Easy bleeding   [] Hypercoagulable state   [] Anemic  [] Hepatitis Gastrointestinal:  [] Blood in stool   [] Vomiting blood  [] Gastroesophageal reflux/heartburn   [] Difficulty swallowing. Genitourinary:  [x] Chronic kidney disease   [] Difficult  urination  [] Frequent urination  [] Burning with urination   [] Blood in urine Skin:  [] Rashes   [] Ulcers   [] Wounds Psychological:  [] History of anxiety   []  History of major depression.  Physical Examination  Vitals:   05/04/20 2026 05/05/20 0009 05/05/20 0553 05/05/20 0851  BP: (!) 161/71 (!) 158/139 (!) 146/59 (!) 152/59  Pulse: 70 72 72 84  Resp: 17 17 16 18   Temp: 98 F (36.7 C) 98.7 F (37.1 C) 98.6 F (37 C) 97.8 F (36.6 C)  TempSrc: Oral Oral Oral Oral  SpO2: 97%  99% 100%  Weight:      Height:       Body mass index is 26.63 kg/m. Gen:  WD/WN, NAD Head: [place]/AT, No temporalis wasting. Prominent temp pulse not noted. Ear/Nose/Throat: Hearing grossly intact, nares w/o erythema or drainage, oropharynx w/o Erythema/Exudate Eyes: Sclera non-icteric, conjunctiva clear Neck: Trachea midline.  No JVD.  Pulmonary:  Good air movement, respirations not labored, equal bilaterally.  Cardiac: RRR, normal S1, S2. Vascular:  Vessel Right Left  Radial Palpable Palpable  Ulnar Palpable Palpable  Brachial Palpable Palpable  Carotid Palpable, without bruit Palpable, without bruit  Aorta Not palpable N/A  Femoral Palpable Palpable  Popliteal Palpable Palpable  PT Non-Palpable Non-Palpable  DP Non-Palpable Non-Palpable   Right lower extremity: Thigh soft.  Calf soft.  Extremities warm distally to toes.  Hard to palpate pedal pulses.  There is some discoloration possibly early gangrenous changes noted to the fourth toe.  Clean and dry at this time.  Gastrointestinal: soft, non-tender/non-distended. No guarding/reflex.  Musculoskeletal: M/S 5/5 throughout.  Extremities without ischemic changes.  No deformity or atrophy. No edema. Neurologic: Sensation grossly intact in extremities.  Symmetrical.  Speech is fluent. Motor exam as listed above. Psychiatric: Judgment intact, Mood & affect appropriate for pt's clinical situation. Dermatologic: As above  Lymph : No Cervical, Axillary, or  Inguinal lymphadenopathy.  CBC Lab Results  Component Value Date   WBC 5.0 05/05/2020   HGB 7.4 (L) 05/05/2020   HCT 23.1 (L) 05/05/2020   MCV 86.8 05/05/2020   PLT 257 05/05/2020   BMET    Component Value Date/Time   NA 134 (L) 05/05/2020 0956   K 4.9 05/05/2020 0956   CL 103 05/05/2020 0956   CO2 22 05/05/2020 0956   GLUCOSE 376 (H) 05/05/2020 0956   BUN 27 (H) 05/05/2020 0956   CREATININE 1.27 (H) 05/05/2020 0956   CALCIUM 9.2 05/05/2020 0956   GFRNONAA 44 (L) 05/05/2020 0956   GFRAA 45 (L) 09/04/2019 1249   Estimated Creatinine Clearance: 38.2 mL/min (A) (by C-G formula based on SCr of 1.27 mg/dL (H)).  COAG Lab Results  Component Value Date   INR 1.2 05/04/2020   INR 1.2 03/11/2018   INR 1.1 03/09/2018   Radiology DG Foot Complete Right  Result Date: 05/04/2020 CLINICAL DATA:  Infection fourth toe RIGHT foot EXAM: RIGHT FOOT COMPLETE - 3+ VIEW COMPARISON:  None FINDINGS: Soft tissue swelling of the toes greatest at fourth toe. Osseous demineralization. Degenerative changes at first MTP joint with hallux valgus and bunion deformity. No acute fracture or dislocation. Deformity at base of proximal phalanx fifth toe likely sequela of remote trauma. IMPRESSION: No definite acute osseous abnormalities. Hallux valgus with bunion  deformity and associated degenerative changes first MTP joint. Probable posttraumatic deformity at base of proximal phalanx fifth toe. Electronically Signed   By: Ulyses Southward M.D.   On: 05/04/2020 16:49   Assessment/Plan Velna Hedgecock is a 75 year old female with medical history significant for peripheral artery disease on Xarelto who states her right fourth toe was injured essential hypertension, type 2 diabetes, CKD 3B, chronic normocytic anemia, who presented to [facility] ED.  1.  Known atherosclerotic disease to the bilateral lower extremity: Patient is well-known to our service as she has required many endovascular and open interventions to restore  arterial patency.  Patient now presents with discoloration to the right fourth toe.  Patient states that she has pretty significant neuropathy and is unable to feel any pain.  In the setting of known atherosclerotic disease, with multiple continued risk factors, new diagnosis of discoloration with possible early gangrenous changes to the right foot the patient undergo repeat angiogram in attempt assess patient's anatomy contributing degree of atherosclerotic disease is appropriate and attempt to revascularize leg can be made at that time.  Procedure, risks and benefits were explained to the patient and her husband who is at the bedside.  All questions were answered.  Patient wishes to proceed.  We will plan on this on Thursday with Dr. Wyn Quaker.  2.  Diabetes: Seems to be uncontrolled Encouraged good control as its slows the progression of atherosclerotic disease  3.  Hyperlipidemia: On aspirin and statin for medical management Encouraged good control as its slows the progression of atherosclerotic disease  4.  Chronic kidney disease: Patient with stage III chronic kidney disease We do use contrast during the angiogram however with the patient's chronic kidney disease we will try to use lowest possible  Discussed with Dr. Weldon Inches, PA-C  05/05/2020 3:46 PM  This note was created with Dragon medical transcription system.  Any error is purely unintentional

## 2020-05-06 ENCOUNTER — Other Ambulatory Visit: Payer: Self-pay | Admitting: Urology

## 2020-05-06 ENCOUNTER — Ambulatory Visit: Payer: Medicare Other

## 2020-05-06 ENCOUNTER — Other Ambulatory Visit (INDEPENDENT_AMBULATORY_CARE_PROVIDER_SITE_OTHER): Payer: Self-pay | Admitting: Vascular Surgery

## 2020-05-06 DIAGNOSIS — I96 Gangrene, not elsewhere classified: Secondary | ICD-10-CM | POA: Diagnosis not present

## 2020-05-06 LAB — GLUCOSE, CAPILLARY
Glucose-Capillary: 142 mg/dL — ABNORMAL HIGH (ref 70–99)
Glucose-Capillary: 308 mg/dL — ABNORMAL HIGH (ref 70–99)
Glucose-Capillary: 172 mg/dL — ABNORMAL HIGH (ref 70–99)
Glucose-Capillary: 180 mg/dL — ABNORMAL HIGH (ref 70–99)

## 2020-05-06 LAB — CBC
HCT: 24.4 % — ABNORMAL LOW (ref 36.0–46.0)
Hemoglobin: 7.8 g/dL — ABNORMAL LOW (ref 12.0–15.0)
MCH: 28 pg (ref 26.0–34.0)
MCHC: 32 g/dL (ref 30.0–36.0)
MCV: 87.5 fL (ref 80.0–100.0)
Platelets: 285 10*3/uL (ref 150–400)
RBC: 2.79 MIL/uL — ABNORMAL LOW (ref 3.87–5.11)
RDW: 18.2 % — ABNORMAL HIGH (ref 11.5–15.5)
WBC: 5.2 10*3/uL (ref 4.0–10.5)
nRBC: 0 % (ref 0.0–0.2)

## 2020-05-06 LAB — BASIC METABOLIC PANEL
Anion gap: 7 (ref 5–15)
BUN: 21 mg/dL (ref 8–23)
CO2: 23 mmol/L (ref 22–32)
Calcium: 8.9 mg/dL (ref 8.9–10.3)
Chloride: 105 mmol/L (ref 98–111)
Creatinine, Ser: 1.15 mg/dL — ABNORMAL HIGH (ref 0.44–1.00)
GFR, Estimated: 50 mL/min — ABNORMAL LOW (ref >60)
Glucose, Bld: 349 mg/dL — ABNORMAL HIGH (ref 70–99)
Potassium: 4.3 mmol/L (ref 3.5–5.1)
Sodium: 135 mmol/L (ref 135–145)

## 2020-05-06 MED ORDER — BENZOCAINE 10 % MT GEL
Freq: Three times a day (TID) | OROMUCOSAL | Status: DC | PRN
  Filled 2020-05-06: qty 9

## 2020-05-06 MED ORDER — SODIUM CHLORIDE 0.9 % IV SOLN: INTRAVENOUS | Status: DC

## 2020-05-06 NOTE — Progress Notes (Addendum)
PT Cancellation Note  Patient Details Name: Cheryl Leonard MRN: [id number] DOB: 03-Nov-1945   Cancelled Treatment:    Reason Eval/Treat Not Completed: Medical issues which prohibited therapy (Per primary attending, hold PT/OT until plan established by vascular/podiatry.  Per notes, scheduled for vascular procedure 5/5, amputation 5/6.  Will complete PT orders at this time while work-up completed; please re-consult as medically appropriate.)  Marcus Schwandt H. Manson Passey, PT, DPT, NCS 05/06/20, 12:56 PM [phone]

## 2020-05-06 NOTE — Progress Notes (Signed)
PROGRESS NOTE    Cheryl Leonard  VOJ:[id number] DOB: Aug 01, 1945 DOA: 05/04/2020 PCP: Danella Penton, MD   Brief Narrative: Taken from prior notes. Cheryl Leonard is a 75 year old female with past medical history significant for peripheral artery disease on Xarelto followed by vascular surgery outpatient, recent right fourth toe injury followed by podiatry, essential hypertension, type 2 diabetes mellitus, CKD stage IIIb, chronic normocytic anemia who was directed to [facility] ED by her podiatrist, Dr. Excell Seltzer on 5/2 for concern of early gangrene affecting her right fourth toe.  Patient reports injury to that toe several weeks ago and was on oral antibiotics with Keflex over the past 7 days with some improvement initially; although she noted day of admission that her toe was dark in appearance.  Patient denies any pain, but underlying significant peripheral neuropathy.  MRI positive for fourth toe osteomyelitis. Patient was admitted at podiatrist request, started on ceftriaxone. Going for angiography/angioplasty of lower extremities tomorrow followed by forced toe amputation on Friday.  Subjective: Patient was complaining of some cold sore on lower lip, she was seen along with Dr. Excell Seltzer from podiatry and having her dressing change.  Denies any foot pain.  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 Patient is going for angiography/angioplasty with vascular surgeon tomorrow before proceeding with fourth toe amputation with podiatry on Friday.  Blood cultures remain negative. -Continue  with ceftriaxone -Continue with pain management.  Peripheral arterial disease.  Patient with history of prior bilateral femoral endarterectomies in March 2020,stent left SFA September 2020, stent right popliteal/SFA October 2020.  She follow-up closely with Dr. Wyn Quaker from vascular surgery. -Going for repeat angiography/angioplasty. -On aspirin and Xarelto at home. -Keep holding Xarelto for possible surgical intervention. -Continue with Crestor  Essential hypertension Home regimen includes metoprolol succinate 50 mg p.o. daily, olmesartan 20 mg p.o. daily, amlodipine 5 mg p.o. daily. Her home dose of olmesartan was held initially due to hyperkalemia. -Continue with metoprolol and amlodipine. -Will restart olmesartan if needed  Type 2 diabetes mellitus with neuropathy. -Continue with Lantus 25 units at bedtime. -Continue with SSI along with mealtime coverage -Continue with gabapentin.  CKD stage IIIb.  Creatinine at baseline. -Monitor renal function. -Avoid nephrotoxins  Chronic normocytic anemia.  Hemoglobin seems stable. -Monitor hemoglobin. -Transfuse if below 7  Hypothyroidism. -Continue home dose of levothyroxine  Hyperlipidemia. -Continue home dose of Crestor.  Anxiety/depression. -Continue home dose of Cymbalta and as needed Xanax  GERD. -Continue home dose of Pepcid  Objective: Vitals:   05/06/20 0602 05/06/20 0700 05/06/20 1055 05/06/20 1554  BP: (!) 136/59 139/67 126/73 (!) 157/50  Pulse: 73 79 70 75  Resp: 17 18 16 16   Temp: 98.1 F (36.7 C) 98.6 F (37 C) 99 F (37.2 C) 99.2 F (37.3 C)  TempSrc: Oral Oral Oral Oral  SpO2: 99% 99% 98% 100%  Weight:      Height:        Intake/Output Summary (Last 24 hours) at 05/06/2020 1731 Last data filed at 05/06/2020 0700 Gross per 24 hour  Intake 0 ml  Output --  Net  0 ml   Filed Weights   05/04/20 1548  Weight: 72.6 kg    Examination:  General exam: Appears calm and comfortable  Respiratory system:  Clear to auscultation. Respiratory effort normal. Cardiovascular system: S1 & S2 heard, RRR. No JVD, murmurs, rubs, gallops or clicks. Gastrointestinal system: Soft, nontender, nondistended, bowel sounds positive. Central nervous system: Alert and oriented. No focal neurological deficits.Symmetric 5 x 5 power. Extremities: No edema, no cyanosis, pulses intact and symmetrical, right foot with gangrenous toe. Psychiatry: Judgement and insight appear normal. Mood & affect appropriate.    DVT prophylaxis: Lovenox Code Status: Full Family Communication: Discussed with patient Disposition Plan:  Status is: Inpatient  Remains inpatient appropriate because:Inpatient level of care appropriate due to severity of illness   Dispo:  Patient From: Home  Planned Disposition: Home  Medically stable for discharge: No   Level of care: Med-Surg  All the records are reviewed and case discussed with Care Management/Social Worker. Management plans discussed with the patient, nursing and they are in agreement.  Consultants:   Podiatry  Vascular surgery  Procedures:  Antimicrobials:  Ceftriaxone  Data Reviewed: I have personally reviewed following labs and imaging studies  CBC: Recent Labs  Lab 05/04/20 1553 05/05/20 0956 05/06/20 1000  WBC 6.8 5.0 5.2  NEUTROABS 4.4  --   --   HGB 8.2* 7.4* 7.8*  HCT 26.0* 23.1* 24.4*  MCV 89.0 86.8 87.5  PLT 326 257 285   Basic Metabolic Panel: Recent Labs  Lab 05/04/20 1553 05/05/20 0956 05/06/20 1000  NA 136 134* 135  K 5.2* 4.9 4.3  CL 106 103 105  CO2 21* 22 23  GLUCOSE 196* 376* 349*  BUN 33* 27* 21  CREATININE 1.32* 1.27* 1.15*  CALCIUM 9.5 9.2 8.9  MG  --  1.8  --   PHOS  --  3.7  --    GFR: Estimated Creatinine Clearance: 42.2 mL/min (A) (by C-G formula based on SCr of 1.15 mg/dL (H)). Liver Function Tests: No results for input(s): AST, ALT, ALKPHOS, BILITOT, PROT, ALBUMIN in the last 168 hours. No results for input(s):  LIPASE, AMYLASE in the last 168 hours. No results for input(s): AMMONIA in the last 168 hours. Coagulation Profile: Recent Labs  Lab 05/04/20 1553  INR 1.2   Cardiac Enzymes: No results for input(s): CKTOTAL, CKMB, CKMBINDEX, TROPONINI in the last 168 hours. BNP (last 3 results) No results for input(s): PROBNP in the last 8760 hours. HbA1C: Recent Labs    05/04/20 2106  HGBA1C 6.3*   CBG: Recent Labs  Lab 05/05/20 1647 05/05/20 2032 05/06/20 0817 05/06/20 1055 05/06/20 1555  GLUCAP 111* 169* 142* 308* 172*   Lipid Profile: No results for input(s): CHOL, HDL, LDLCALC, TRIG, CHOLHDL, LDLDIRECT in the last 72 hours. Thyroid Function Tests: No results for input(s): TSH, T4TOTAL, FREET4, T3FREE, THYROIDAB in the last 72 hours. Anemia Panel: No results for input(s): VITAMINB12, FOLATE, FERRITIN, TIBC, IRON, RETICCTPCT in the last 72 hours. Sepsis Labs: Recent Labs  Lab 05/04/20 1553  LATICACIDVEN 1.8    Recent Results (from the past 240 hour(s))  Microscopic Examination     Status: None   Collection Time: 04/27/20  9:24 AM   Urine  Result Value Ref Range Status   WBC, UA 0-5 0 - 5 /hpf Final   RBC 0-2 0 - 2 /hpf Final   Epithelial Cells (non renal) 0-10 0 - 10 /hpf Final   Bacteria, UA None seen None seen/Few Final  SARS  CORONAVIRUS 2 (TAT 6-24 HRS) Nasopharyngeal Nasopharyngeal Swab     Status: None   Collection Time: 05/04/20  5:59 PM   Specimen: Nasopharyngeal Swab  Result Value Ref Range Status   SARS Coronavirus 2 NEGATIVE NEGATIVE Final    Comment: (NOTE) SARS-CoV-2 target nucleic acids are NOT DETECTED.  The SARS-CoV-2 RNA is generally detectable in upper and lower respiratory specimens during the acute phase of infection. Negative results do not preclude SARS-CoV-2 infection, do not rule out co-infections with other pathogens, and should not be used as the sole basis for treatment or other patient management decisions. Negative results must be combined  with clinical observations, patient history, and epidemiological information. The expected result is Negative.  Fact Sheet for Patients: HairSlick.no  Fact Sheet for Healthcare Providers: quierodirigir.com  This test is not yet approved or cleared by the [place] FDA and  has been authorized for detection and/or diagnosis of SARS-CoV-2 by FDA under an Emergency Use Authorization (EUA). This EUA will remain  in effect (meaning this test can be used) for the duration of the COVID-19 declaration under Se ction 564(b)(1) of the Act, 21 U.S.C. section 360bbb-3(b)(1), unless the authorization is terminated or revoked sooner.  Performed at [facility], [address] [place], [place] [address]   CULTURE, BLOOD (ROUTINE X 2) w Reflex to ID Panel     Status: None (Preliminary result)   Collection Time: 05/04/20  9:06 PM   Specimen: BLOOD  Result Value Ref Range Status   Specimen Description BLOOD BLOOD LEFT HAND  Final   Special Requests   Final    BOTTLES DRAWN AEROBIC AND ANAEROBIC Blood Culture adequate volume   Culture   Final    NO GROWTH 2 DAYS Performed at [facility], [address] [place], [place] [address]    Report Status PENDING  Incomplete  CULTURE, BLOOD (ROUTINE X 2) w Reflex to ID Panel     Status: None (Preliminary result)   Collection Time: 05/04/20  9:06 PM   Specimen: BLOOD  Result Value Ref Range Status   Specimen Description BLOOD BLOOD LEFT FOREARM  Final   Special Requests   Final    BOTTLES DRAWN AEROBIC AND ANAEROBIC Blood Culture adequate volume   Culture   Final    NO GROWTH 2 DAYS Performed at [facility], [address] [place], [place] [address]    Report Status PENDING  Incomplete     Radiology Studies: MR FOOT RIGHT WO CONTRAST  Result Date: 05/05/2020 CLINICAL DATA:  Foot swelling and diabetes. Wound infection along the right fourth toe EXAM: MRI OF THE  RIGHT FOREFOOT WITHOUT CONTRAST TECHNIQUE: Multiplanar, multisequence MR imaging of the right forefoot was performed. No intravenous contrast was administered. COMPARISON:  Foot radiographs of 05/04/2020 FINDINGS: Bones/Joint/Cartilage Abnormal high T2 and low T1 signal in the proximal, middle, and distal phalanges of the fourth toe, highly suspicious in this clinical context for active osteomyelitis. Subtle endosteal edema along the medial first digit sesamoid is probably attributable to the severe degree of degenerative arthropathy between the first metatarsal head and the sesamoids. Notable erosions of the first metatarsal head especially medially, raising suspicion for gout arthropathy. Erosion or degenerative subcortical cyst along the medial base of the lateral cuneiform, image 5 series 10. Dorsal talonavicular joint effusion with mild spurring. Small anterior tibiotalar joint effusion. Deformity along the base of the proximal phalanx of the small toe, posttraumatic versus due to chronic arthropathy. Ligaments Lisfranc ligament  intact Muscles and Tendons Low-level accentuated T2 signal diffusely through the regional plantar musculature, probably neurogenic. Soft tissues Dorsal subcutaneous edema in the foot extending into the toes. Edema is most striking in the fourth toe and probably represent cellulitis. IMPRESSION: 1. Abnormal osseous edema in the phalanges of the fourth toe highly suspicious in this clinical context for acute osteomyelitis. Surrounding cellulitis in the fourth toe. 2. Notable erosions in the head of the first metatarsal, probably from gout. 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 3. Degenerative subcortical cyst or erosion along the medial base of the lateral cuneiform. 4. Small effusions of the talonavicular and tibiotalar articulations seen on the sagittal images. 5. Potential low-grade sesamoiditis of the medial sesamoid of the first  digit. However this may be due to the underlying arthropathy. 6. Chronic appearing deformity of the base of the proximal phalanx small toe, likely posttraumatic. Electronically Signed   By: Gaylyn Rong M.D.   On: 05/05/2020 17:33    Scheduled Meds: . amLODipine  5 mg Oral Daily  . Chlorhexidine Gluconate Cloth  6 each Topical Daily  . DULoxetine  30 mg Oral Daily  . enoxaparin (LOVENOX) injection  40 mg Subcutaneous Q24H  . famotidine  40 mg Oral BID  . gabapentin  100 mg Oral Daily  . insulin aspart  0-5 Units Subcutaneous QHS  . insulin aspart  0-9 Units Subcutaneous TID WC  . insulin aspart  3 Units Subcutaneous TID WC  . insulin glargine  25 Units Subcutaneous QHS  . levothyroxine  100 mcg Oral QAC breakfast  . metoprolol succinate  50 mg Oral Daily  . pantoprazole  40 mg Oral BH-q7a  . rosuvastatin  20 mg Oral BH-q7a   Continuous Infusions: . [START ON 05/07/2020] sodium chloride    . cefTRIAXone (ROCEPHIN)  IV 2 g (05/05/20 2205)  . lactated ringers Stopped (05/06/20 1712)     LOS: 2 days   Time spent: 40 minutes. More than 50% of the time was spent in counseling/coordination of care  Arnetha Courser, MD [facility]  If 7PM-7AM, please contact night-coverage [email or web address]  05/06/2020, 5:31 PM   This record has been created using Conservation officer, historic buildings. Errors have been sought and corrected,but may not always be located. Such creation errors do not reflect on the standard of care.

## 2020-05-06 NOTE — Progress Notes (Signed)
OT Cancellation Note  Patient Details Name: Cheryl Leonard MRN: [id number] DOB: [birth year]   Cancelled Treatment:    Reason Eval/Treat Not Completed: Patient not medically ready. Per chart review, plan for pt to have surgery on 05/08/20. Plan to re-consult after surgery, will sign off at this time. Thank you  Kathie Dike, M.S. OTR/L  05/06/20, 12:52 PM  ascom [phone]

## 2020-05-06 NOTE — Plan of Care (Signed)

## 2020-05-06 NOTE — Progress Notes (Signed)
Orthopedic progress note  REQUESTING PHYSICIAN: Arnetha CourserAmin, Sumayya, MD  Chief Complaint: Right fourth toe gangrene  HPI: Laymond PurserVeronica S Creedon is a 75 y.o. female who presents today resting in chair with dressing on the right foot clean, dry, and intact.  Patient notes no issues since yesterday.  Patient is ready for angiogram and is supposed to have this tomorrow.  Patient would also like to discuss the MRI results of her right foot that was performed yesterday.  Past Medical History:  Diagnosis Date  . Anemia   . Anxiety   . Arthritis    Gout  . Cataracts, both eyes   . Diabetic retinopathy (HCC)    NPDR OU  . Diabetic retinopathy (HCC)   . GERD (gastroesophageal reflux disease)   . Gout   . Headache    h/o migraines  . History of fracture of patella    right knee  . History of positive PPD    Patient always shows positive  . Hyperlipidemia   . Hypertension   . Hypertensive retinopathy    OU  . Hypothyroidism   . Lichen sclerosus 12/30/2013   of vulva  . Metatarsal fracture   . Neuropathy   . Peripheral vascular disease (HCC)   . Polyneuropathy    numbness and tingling in feet and toes  . Renal insufficiency    Stage 3  . Sleep apnea    does not use cpap-lost weight   . Type 2 diabetes mellitus, uncontrolled (HCC)    Past Surgical History:  Procedure Laterality Date  . ABDOMINAL HYSTERECTOMY    . APPENDECTOMY    . BREAST REDUCTION SURGERY  2001  . CATARACT EXTRACTION    . CESAREAN SECTION  1976  . COLONOSCOPY  03/05/2013   Nml - due for repeat 03/06/2018  . COLONOSCOPY WITH PROPOFOL N/A 03/18/2019   Procedure: COLONOSCOPY WITH PROPOFOL;  Surgeon: Toledo, Boykin Nearingeodoro K, MD;  Location: [facility];  Service: Gastroenterology;  Laterality: N/A;  . DIAGNOSTIC LAPAROSCOPY    . DILATION AND CURETTAGE OF UTERUS  1989  . ENDARTERECTOMY FEMORAL Bilateral 03/09/2018   Procedure: ENDARTERECTOMY FEMORAL;  Surgeon: Annice Needyew, Jason S, MD;  Location: [facility];  Service: Vascular;   Laterality: Bilateral;  . ENDARTERECTOMY POPLITEAL Left 03/09/2018   Procedure: ENDARTERECTOMY POPLITEAL AND SFA;  Surgeon: Annice Needyew, Jason S, MD;  Location: [facility];  Service: Vascular;  Laterality: Left;  . ESOPHAGOGASTRODUODENOSCOPY  03/05/2013  . ESOPHAGOGASTRODUODENOSCOPY (EGD) WITH PROPOFOL N/A 03/18/2019   Procedure: ESOPHAGOGASTRODUODENOSCOPY (EGD) WITH PROPOFOL;  Surgeon: Toledo, Boykin Nearingeodoro K, MD;  Location: [facility];  Service: Gastroenterology;  Laterality: N/A;  . EYE SURGERY    . Eyelid Surgery  2012  . INTRAMEDULLARY (IM) NAIL INTERTROCHANTERIC Left 10/30/2015   Procedure: INTRAMEDULLARY (IM) NAIL INTERTROCHANTRIC ;  Surgeon: Kennedy BuckerMichael Menz, MD;  Location: [facility];  Service: Orthopedics;  Laterality: Left;  . KYPHOPLASTY N/A 10/25/2018   Procedure: L4 KYPHOPLASTY;  Surgeon: Kennedy BuckerMenz, Michael, MD;  Location: [facility];  Service: Orthopedics;  Laterality: N/A;  . LAPAROSCOPIC HYSTERECTOMY  2000   total  . LOWER EXTREMITY ANGIOGRAPHY Left 03/08/2017   Procedure: LOWER EXTREMITY ANGIOGRAPHY;  Surgeon: Annice Needyew, Jason S, MD;  Location: [facility];  Service: Cardiovascular;  Laterality: Left;  . LOWER EXTREMITY ANGIOGRAPHY Left 10/30/2017   Procedure: LOWER EXTREMITY ANGIOGRAPHY;  Surgeon: Annice Needyew, Jason S, MD;  Location: [facility];  Service: Cardiovascular;  Laterality: Left;  . LOWER EXTREMITY ANGIOGRAPHY Right 03/08/2018   Procedure: LOWER EXTREMITY ANGIOGRAPHY;  Surgeon:  Annice Needy, MD;  Location: [facility];  Service: Cardiovascular;  Laterality: Right;  . LOWER EXTREMITY ANGIOGRAPHY Left 10/01/2018   Procedure: LOWER EXTREMITY ANGIOGRAPHY;  Surgeon: Annice Needy, MD;  Location: [facility];  Service: Cardiovascular;  Laterality: Left;  . LOWER EXTREMITY ANGIOGRAPHY Right 10/08/2018   Procedure: LOWER EXTREMITY ANGIOGRAPHY;  Surgeon: Annice Needy, MD;  Location: [facility];  Service: Cardiovascular;  Laterality: Right;  . PERIPHERAL VASCULAR INTERVENTION   03/08/2018   Procedure: PERIPHERAL VASCULAR INTERVENTION;  Surgeon: Annice Needy, MD;  Location: [facility];  Service: Cardiovascular;;  . PORTA CATH INSERTION N/A 02/17/2020   Procedure: PORTA CATH INSERTION;  Surgeon: Annice Needy, MD;  Location: [facility];  Service: Cardiovascular;  Laterality: N/A;  . REDUCTION MAMMAPLASTY  1997  . SACROPLASTY N/A 10/25/2018   Procedure: S1 SACROPLASTY;  Surgeon: Kennedy Bucker, MD;  Location: [facility];  Service: Orthopedics;  Laterality: N/A;   Social History   Socioeconomic History  . Marital status: Married    Spouse name: Jonny Ruiz  . Number of children: 3  . Years of education: Not on file  . Highest education level: Not on file  Occupational History  . Occupation: Barrister's clerk  Tobacco Use  . Smoking status: Former Smoker    Packs/day: 1.00    Years: 20.00    Pack years: 20.00    Types: Cigarettes    Quit date: 03/07/1996    Years since quitting: 24.1  . Smokeless tobacco: Never Used  . Tobacco comment: started smoking at age 28  Vaping Use  . Vaping Use: Never used  Substance and Sexual Activity  . Alcohol use: No    Alcohol/week: 0.0 standard drinks  . Drug use: No  . Sexual activity: Yes    Partners: Male    Birth control/protection: Surgical, Post-menopausal  Other Topics Concern  . Not on file  Social History Narrative   Lives in [place]; with husband; quit > 20 years; no alcohol; used to work at [employer] at [employer].    Social Determinants of Health   Financial Resource Strain: Not on file  Food Insecurity: Not on file  Transportation Needs: Not on file  Physical Activity: Not on file  Stress: Not on file  Social Connections: Not on file   Family History  Problem Relation Age of Onset  . Coronary artery disease Father   . Heart attack Father   . Coronary artery disease Mother   . Heart attack Mother   . Ovarian cancer Sister 68       sister had hormonal therapy for IVF txs-which increased risk  factor for ovarian cancer  . Breast cancer Neg Hx    Allergies  Allergen Reactions  . Ace Inhibitors Other (See Comments)   Prior to Admission medications   Medication Sig Start Date End Date Taking? Authorizing Provider  ALPRAZolam Prudy Feeler) 0.25 MG tablet Take 0.25 mg by mouth daily as needed for anxiety or sleep.  05/07/18  Yes [provider]  amLODipine (NORVASC) 5 MG tablet Take 5 mg by mouth daily.   Yes [provider]  aspirin EC 81 MG tablet Take 81 mg by mouth daily.    Yes [provider]  cephALEXin (KEFLEX) 500 MG capsule Take 500 mg by mouth 3 (three) times daily. 04/27/20  Yes [provider]  cholecalciferol (VITAMIN D) 1000 units tablet Take 1,000 Units by mouth 2 (two) times daily.  Yes [provider]  CORAL CALCIUM PO Take 1 tablet by mouth 2 (two) times daily.    Yes [provider]  denosumab (PROLIA) 60 MG/ML SOLN injection Inject 60 mg into the skin every 6 (six) months.  03/15/17  Yes [provider]  DULoxetine (CYMBALTA) 30 MG capsule Take 30 mg by mouth daily. 01/02/20  Yes [provider]  estradiol (ESTRACE) 0.1 MG/GM vaginal cream Apply one pea-sized amount around the opening of the urethra daily for two weeks, then three times weekly thereafter. 04/27/20  Yes Vaillancourt, Samantha, PA-C  famotidine (PEPCID) 40 MG tablet Take 40 mg by mouth daily. 09/30/18  Yes [provider]  furosemide (LASIX) 20 MG tablet Take 20 mg by mouth daily as needed for fluid or edema.  05/07/18 09/04/26 Yes [provider]  gabapentin (NEURONTIN) 100 MG capsule Take 1 capsule by mouth 2 (two) times daily. 03/04/19  Yes [provider]  levothyroxine (SYNTHROID, LEVOTHROID) 100 MCG tablet Take 100 mcg by mouth daily before breakfast.  08/03/14  Yes [provider]  lidocaine-prilocaine (EMLA) cream Apply 1 application topically as needed (apply prior to port a cath access). 04/08/20  Yes  Earna Coder, MD  Magnesium 500 MG TABS Take 500 mg by mouth every morning.    Yes [provider]  metFORMIN (GLUCOPHAGE) 1000 MG tablet Take 1 tablet by mouth 2 (two) times daily with a meal. 01/17/20  Yes [provider]  metoprolol succinate (TOPROL-XL) 50 MG 24 hr tablet Take 50 mg by mouth daily. 12/04/18 05/04/20 Yes [provider]  mirabegron ER (MYRBETRIQ) 50 MG TB24 tablet Take 1 tablet (50 mg total) by mouth daily. 04/27/20  Yes Vaillancourt, Samantha, PA-C  mirtazapine (REMERON) 15 MG tablet Take 15 mg by mouth at bedtime. 02/20/19 05/04/20 Yes [provider]  Multiple Vitamin (MULTIVITAMIN WITH MINERALS) TABS tablet Take 1 tablet by mouth daily.   Yes [provider]  olmesartan (BENICAR) 20 MG tablet Take 20 mg by mouth daily.   Yes [provider]  pantoprazole (PROTONIX) 40 MG tablet Take 40 mg by mouth every morning.    Yes [provider]  rivaroxaban (XARELTO) 20 MG TABS tablet Take 1 tablet (20 mg total) by mouth daily with supper. 12/31/19  Yes Georgiana Spinner, NP  rosuvastatin (CRESTOR) 20 MG tablet Take 20 mg by mouth every morning.  03/19/18 09/04/26 Yes [provider]  TRESIBA FLEXTOUCH 200 UNIT/ML SOPN Inject 30 Units as directed at bedtime.  11/15/16  Yes [provider]  vitamin B-12 (CYANOCOBALAMIN) 100 MCG tablet Take 100 mcg by mouth daily.   Yes [provider]  vitamin E 400 UNIT capsule Take 400 Units by mouth daily.   Yes [provider]  zolpidem (AMBIEN) 5 MG tablet Take 1 tablet (5 mg total) by mouth at bedtime. Patient taking differently: Take 10 mg by mouth at bedtime as needed. 03/12/18  Yes [name], Cala Bradford A, PA-C  Insulin Pen Needle 31G X 5 MM MISC once daily. 07/04/16   [provider]  mupirocin cream (BACTROBAN) 2 % Apply topically 2 (two) times daily. Patient not taking: No sig reported 03/17/20   [provider]  nitrofurantoin  (MACRODANTIN) 100 MG capsule Take by mouth. Patient not taking: No sig reported 03/24/20   [provider]  nystatin cream (MYCOSTATIN) Apply 1 application topically 2 (two) times daily. Patient not taking: No sig reported 04/07/20 04/07/21  [provider]   MR  FOOT RIGHT WO CONTRAST  Result Date: 05/05/2020 CLINICAL DATA:  Foot swelling and diabetes. Wound infection along the right fourth toe EXAM: MRI OF THE RIGHT FOREFOOT WITHOUT CONTRAST TECHNIQUE: Multiplanar, multisequence MR imaging of the right forefoot was performed. No intravenous contrast was administered. COMPARISON:  Foot radiographs of 05/04/2020 FINDINGS: Bones/Joint/Cartilage Abnormal high T2 and low T1 signal in the proximal, middle, and distal phalanges of the fourth toe, highly suspicious in this clinical context for active osteomyelitis. Subtle endosteal edema along the medial first digit sesamoid is probably attributable to the severe degree of degenerative arthropathy between the first metatarsal head and the sesamoids. Notable erosions of the first metatarsal head especially medially, raising suspicion for gout arthropathy. Erosion or degenerative subcortical cyst along the medial base of the lateral cuneiform, image 5 series 10. Dorsal talonavicular joint effusion with mild spurring. Small anterior tibiotalar joint effusion. Deformity along the base of the proximal phalanx of the small toe, posttraumatic versus due to chronic arthropathy. Ligaments Lisfranc ligament intact Muscles and Tendons Low-level accentuated T2 signal diffusely through the regional plantar musculature, probably neurogenic. Soft tissues Dorsal subcutaneous edema in the foot extending into the toes. Edema is most striking in the fourth toe and probably represent cellulitis. IMPRESSION: 1. Abnormal osseous edema in the phalanges of the fourth toe highly suspicious in this clinical context for acute osteomyelitis. Surrounding cellulitis in the fourth  toe. 2. Notable erosions in the head of the first metatarsal, probably from gout. 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 3. Degenerative subcortical cyst or erosion along the medial base of the lateral cuneiform. 4. Small effusions of the talonavicular and tibiotalar articulations seen on the sagittal images. 5. Potential low-grade sesamoiditis of the medial sesamoid of the first digit. However this may be due to the underlying arthropathy. 6. Chronic appearing deformity of the base of the proximal phalanx small toe, likely posttraumatic. Electronically Signed   By: Gaylyn Rong M.D.   On: 05/05/2020 17:33   DG Foot Complete Right  Result Date: 05/04/2020 CLINICAL DATA:  Infection fourth toe RIGHT foot EXAM: RIGHT FOOT COMPLETE - 3+ VIEW COMPARISON:  None FINDINGS: Soft tissue swelling of the toes greatest at fourth toe. Osseous demineralization. Degenerative changes at first MTP joint with hallux valgus and bunion deformity. No acute fracture or dislocation. Deformity at base of proximal phalanx fifth toe likely sequela of remote trauma. IMPRESSION: No definite acute osseous abnormalities. Hallux valgus with bunion deformity and associated degenerative changes first MTP joint. Probable posttraumatic deformity at base of proximal phalanx fifth toe. Electronically Signed   By: Ulyses Southward M.D.   On: 05/04/2020 16:49    Positive ROS: All other systems have been reviewed and were otherwise negative with the exception of those mentioned in the HPI and as above.  12 point ROS was performed.  Physical Exam: General: Alert and oriented.  No apparent distress.  Vascular:  Left foot:Dorsalis Pedis:  diminished Posterior Tibial:  diminished  Right foot: Dorsalis Pedis:  diminished Posterior Tibial:  diminished   Neuro:absent protective sensation  Derm: Right fourth toe laterally there is a full-thickness ulcer all the way down to the level of bone  at the PIPJ.  The fourth toe has somewhat of a dusky appearance and there is also a noted dusky appearance to the plantar aspect of the fourth toe at the level of the metatarsophalangeal joint region.  There is no proximal lymphangitic streaking.  Serous serosanguineous and scant purulent drainage  laterally at the PIPJ of the fourth toe.  Ortho/MS: Focal edema to the right fourth toe is noted.  Assessment: 1.  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  Dry gangrene right fourth toe 3.  PVD 4.  Diabetes type 2 polyneuropathy  Plan: -Patient seen and evaluated. -Reviewed x-ray and MRI imaging.  Appears to show osteomyelitis of most of the right fourth toe with some cellulitic changes.  No other infectious tissues are seen.  Fourth metatarsal head appears to be clear of infection according to MRI. -Discussed imaging results with patient in detail. -Patient is to undergo CT angiogram with potential intervention with vascular surgery tomorrow. -Discussed all treatment options with patient both conservative and surgical attempts at correction include potential risks and complications and at this time patient is elected for right partial fourth ray amputation versus selective digital amputation.  It is likely that we will be able to perform just a digital amputation alone but if minimal bleeding occurs or if skin envelope does not appear to be viable then may take it more proximal to ensure proper closure.  Patient has elected to undergo surgical procedure.  Consent to be signed and placed in patient's chart prior to surgery. -We will plan to perform surgery on Friday morning, 05/08/2020.  Patient will be n.p.o. at midnight prior to surgery.  Orders to be placed. -Betadine wet-to-dry dressing placed. -Order placed for surgical shoe for the right foot.  Will order PT after surgery to attempt to ambulate with heel contact after procedure.  Rosetta Posner,  DPM   05/06/2020 12:36 PM

## 2020-05-07 ENCOUNTER — Encounter
Admission: EM | Disposition: A | Discharge status: Home / Self Care | Payer: Self-pay | Source: Home / Self Care | Attending: Hospitalist

## 2020-05-07 DIAGNOSIS — E1165 Type 2 diabetes mellitus with hyperglycemia: Secondary | ICD-10-CM | POA: Diagnosis not present

## 2020-05-07 DIAGNOSIS — N1832 Chronic kidney disease, stage 3b: Secondary | ICD-10-CM

## 2020-05-07 DIAGNOSIS — I10 Essential (primary) hypertension: Secondary | ICD-10-CM | POA: Diagnosis not present

## 2020-05-07 DIAGNOSIS — I739 Peripheral vascular disease, unspecified: Secondary | ICD-10-CM

## 2020-05-07 DIAGNOSIS — I70239 Atherosclerosis of native arteries of right leg with ulceration of unspecified site: Secondary | ICD-10-CM

## 2020-05-07 DIAGNOSIS — D508 Other iron deficiency anemias: Secondary | ICD-10-CM

## 2020-05-07 DIAGNOSIS — E039 Hypothyroidism, unspecified: Secondary | ICD-10-CM | POA: Diagnosis not present

## 2020-05-07 DIAGNOSIS — E782 Mixed hyperlipidemia: Secondary | ICD-10-CM

## 2020-05-07 DIAGNOSIS — I96 Gangrene, not elsewhere classified: Secondary | ICD-10-CM | POA: Diagnosis not present

## 2020-05-07 HISTORY — PX: LOWER EXTREMITY ANGIOGRAPHY: CATH118251

## 2020-05-07 LAB — CBC
HCT: 25.6 % — ABNORMAL LOW (ref 36.0–46.0)
Hemoglobin: 8.2 g/dL — ABNORMAL LOW (ref 12.0–15.0)
MCH: 28 pg (ref 26.0–34.0)
MCHC: 32 g/dL (ref 30.0–36.0)
MCV: 87.4 fL (ref 80.0–100.0)
Platelets: 314 10*3/uL (ref 150–400)
RBC: 2.93 MIL/uL — ABNORMAL LOW (ref 3.87–5.11)
RDW: 18 % — ABNORMAL HIGH (ref 11.5–15.5)
WBC: 5.4 10*3/uL (ref 4.0–10.5)
nRBC: 0.6 % — ABNORMAL HIGH (ref 0.0–0.2)

## 2020-05-07 LAB — BASIC METABOLIC PANEL
Anion gap: 8 (ref 5–15)
BUN: 21 mg/dL (ref 8–23)
CO2: 24 mmol/L (ref 22–32)
Calcium: 9.1 mg/dL (ref 8.9–10.3)
Chloride: 109 mmol/L (ref 98–111)
Creatinine, Ser: 1.11 mg/dL — ABNORMAL HIGH (ref 0.44–1.00)
GFR, Estimated: 52 mL/min — ABNORMAL LOW (ref >60)
Glucose, Bld: 156 mg/dL — ABNORMAL HIGH (ref 70–99)
Potassium: 4.2 mmol/L (ref 3.5–5.1)
Sodium: 141 mmol/L (ref 135–145)

## 2020-05-07 LAB — GLUCOSE, CAPILLARY
Glucose-Capillary: 157 mg/dL — ABNORMAL HIGH (ref 70–99)
Glucose-Capillary: 145 mg/dL — ABNORMAL HIGH (ref 70–99)
Glucose-Capillary: 114 mg/dL — ABNORMAL HIGH (ref 70–99)
Glucose-Capillary: 84 mg/dL (ref 70–99)
Glucose-Capillary: 189 mg/dL — ABNORMAL HIGH (ref 70–99)

## 2020-05-07 LAB — SURGICAL PCR SCREEN
MRSA, PCR: NEGATIVE
Staphylococcus aureus: NEGATIVE

## 2020-05-07 SURGERY — LOWER EXTREMITY ANGIOGRAPHY
Anesthesia: Moderate Sedation | Laterality: Right

## 2020-05-07 MED ORDER — DIPHENHYDRAMINE HCL 50 MG/ML IJ SOLN: 50.0000 mg | Freq: Once | INTRAMUSCULAR | Status: DC | PRN

## 2020-05-07 MED ORDER — INSULIN GLARGINE 100 UNIT/ML ~~LOC~~ SOLN
15.0000 [IU] | Freq: Every day | SUBCUTANEOUS | Status: DC
  Administered 2020-05-07 – 2020-05-08 (×2): 15 [IU] via SUBCUTANEOUS
  Filled 2020-05-07 (×3): qty 0.15

## 2020-05-07 MED ORDER — HYDROMORPHONE HCL 1 MG/ML IJ SOLN: 1.0000 mg | Freq: Once | INTRAMUSCULAR | Status: DC | PRN

## 2020-05-07 MED ORDER — MIDAZOLAM HCL 2 MG/2ML IJ SOLN
INTRAMUSCULAR | Status: DC | PRN
  Administered 2020-05-07 (×2): 1 mg via INTRAVENOUS

## 2020-05-07 MED ORDER — ONDANSETRON HCL 4 MG/2ML IJ SOLN: 4.0000 mg | Freq: Four times a day (QID) | INTRAMUSCULAR | Status: DC | PRN

## 2020-05-07 MED ORDER — FAMOTIDINE 20 MG PO TABS: 40.0000 mg | ORAL_TABLET | Freq: Once | ORAL | Status: DC | PRN

## 2020-05-07 MED ORDER — MIDAZOLAM HCL 2 MG/ML PO SYRP: 8.0000 mg | ORAL_SOLUTION | Freq: Once | ORAL | Status: DC | PRN

## 2020-05-07 MED ORDER — HEPARIN SODIUM (PORCINE) 1000 UNIT/ML IJ SOLN
INTRAMUSCULAR | Status: AC
  Filled 2020-05-07: qty 1

## 2020-05-07 MED ORDER — SODIUM CHLORIDE 0.9 % IV SOLN
INTRAVENOUS | Status: AC
  Filled 2020-05-07: qty 10

## 2020-05-07 MED ORDER — CHLORHEXIDINE GLUCONATE 4 % EX LIQD: 60.0000 mL | Freq: Once | CUTANEOUS | Status: DC

## 2020-05-07 MED ORDER — MIDAZOLAM HCL 5 MG/5ML IJ SOLN
INTRAMUSCULAR | Status: AC
  Filled 2020-05-07: qty 5

## 2020-05-07 MED ORDER — SODIUM CHLORIDE 0.9 % IV SOLN: INTRAVENOUS | Status: DC

## 2020-05-07 MED ORDER — CEFAZOLIN SODIUM-DEXTROSE 1-4 GM/50ML-% IV SOLN
1.0000 g | Freq: Once | INTRAVENOUS | Status: AC
  Administered 2020-05-07: 1 g via INTRAVENOUS

## 2020-05-07 MED ORDER — FENTANYL CITRATE (PF) 100 MCG/2ML IJ SOLN
INTRAMUSCULAR | Status: DC | PRN
  Administered 2020-05-07: 50 ug via INTRAVENOUS

## 2020-05-07 MED ORDER — METHYLPREDNISOLONE SODIUM SUCC 125 MG IJ SOLR: 125.0000 mg | Freq: Once | INTRAMUSCULAR | Status: DC | PRN

## 2020-05-07 MED ORDER — FENTANYL CITRATE (PF) 100 MCG/2ML IJ SOLN
INTRAMUSCULAR | Status: AC
  Filled 2020-05-07: qty 2

## 2020-05-07 SURGICAL SUPPLY — 10 items
CATH ANGIO 5F PIGTAIL 65CM (CATHETERS) ×1 IMPLANT
CATH TEMPO 5F RIM 65CM (CATHETERS) ×1 IMPLANT
COVER PROBE U/S 5X48 (MISCELLANEOUS) ×1 IMPLANT
DEVICE STARCLOSE SE CLOSURE (Vascular Products) ×1 IMPLANT
GLIDEWIRE ADV .035X260CM (WIRE) ×1 IMPLANT
PACK ANGIOGRAPHY (CUSTOM PROCEDURE TRAY) ×2 IMPLANT
SHEATH BRITE TIP 5FRX11 (SHEATH) ×1 IMPLANT
SYR MEDRAD MARK 7 150ML (SYRINGE) ×1 IMPLANT
TUBING CONTRAST HIGH PRESS 48 (TUBING) ×2 IMPLANT
WIRE GUIDERIGHT .035X150 (WIRE) ×2 IMPLANT

## 2020-05-07 NOTE — OR Nursing (Signed)
Called and updated pt's husband per pt request

## 2020-05-07 NOTE — Op Note (Signed)
[facility]  Percutaneous Study/Intervention Procedural Note   Date of Surgery: 05/04/2020 - 05/07/2020  Surgeon(s):Jazariah Teall    Assistants:none  Pre-operative Diagnosis: PAD with ulceration right lower extremity  Post-operative diagnosis:  Same  Procedure(s) Performed:             1.  Ultrasound guidance for vascular access left femoral artery             2.  Catheter placement into right common femoral artery from left femoral approach             3.  Aortogram and selective right lower extremity angiogram             4.  StarClose closure device left femoral artery  EBL: 5 cc  Contrast: 40 cc  Fluoro Time: 3.9 minutes  Moderate Conscious Sedation Time: approximately 25 minutes using 2 mg of Versed and 50 mcg of Fentanyl              Indications:  Patient is a 75 y.o.female with a long history of peripheral arterial disease and multiple previous interventions and surgeries who has a nonhealing ulcer on her right foot. The patient is brought in for angiography for further evaluation and potential treatment.  Due to the limb threatening nature of the situation, angiogram was performed for attempted limb salvage. The patient is aware that if the procedure fails, amputation would be expected.  The patient also understands that even with successful revascularization, amputation may still be required due to the severity of the situation.  Risks and benefits are discussed and informed consent is obtained.   Procedure:  The patient was identified and appropriate procedural time out was performed.  The patient was then placed supine on the table and prepped and draped in the usual sterile fashion. Moderate conscious sedation was administered during a face to face encounter with the patient throughout the procedure with my supervision of the RN administering medicines and monitoring the patient's vital signs, pulse oximetry, telemetry and mental status throughout from the  start of the procedure until the patient was taken to the recovery room. Ultrasound was used to evaluate the left common femoral artery.  It was patent .  A digital ultrasound image was acquired.  A Seldinger needle was used to access the left common femoral artery under direct ultrasound guidance and a permanent image was performed.  A 0.035 J wire was advanced without resistance and a 5Fr sheath was placed.  Pigtail catheter was placed into the aorta and an AP aortogram was performed. This demonstrated normal renal arteries and normal aorta and iliac segments without significant stenosis. I then crossed the aortic bifurcation and advanced to the right femoral head and the right common femoral artery. Selective right lower extremity angiogram was then performed. This demonstrated normal common femoral artery, profunda femoris artery, and superficial femoral artery with multiple previously placed stents that were widely patent.  There was mild stenosis of about 20% below the stents at Hunter's canal.  The popliteal artery was relatively normal.  There was then three-vessel runoff distally without focal stenosis in the tibial vessels.  Her perfusion was adequate for wound healing and there was no need for revascularization.  I elected to terminate the procedure. The sheath was removed and StarClose closure device was deployed in the left femoral artery with excellent hemostatic result. The patient was taken to the recovery room in stable condition having tolerated the procedure well.  Findings:  Aortogram:  The renal arteries were patent.  The aorta and iliac arteries appeared patent although the distal aorta and proximal iliac arteries were hard to see due to her kyphoplasties             Right lower Extremity:  Normal common femoral artery, profunda femoris artery, and superficial femoral artery with multiple previously placed stents that were widely patent.  There was mild stenosis of about 20%  below the stents at Hunter's canal.  The popliteal artery was relatively normal.  There was then three-vessel runoff distally without focal stenosis in the tibial vessels.   Disposition: Patient was taken to the recovery room in stable condition having tolerated the procedure well.  Complications: None  Festus Barren 05/07/2020 3:30 PM   This note was created with Dragon Medical transcription system. Any errors in dictation are purely unintentional.

## 2020-05-07 NOTE — Interval H&P Note (Signed)
History and Physical Interval Note:  05/07/2020 1:51 PM  Cheryl Leonard  has presented today for surgery, with the diagnosis of Atherosclerotic disease with gangrene.  The various methods of treatment have been discussed with the patient and family. After consideration of risks, benefits and other options for treatment, the patient has consented to  Procedure(s): Lower Extremity Angiography (Right) as a surgical intervention.  The patient's history has been reviewed, patient examined, no change in status, stable for surgery.  I have reviewed the patient's chart and labs.  Questions were answered to the patient's satisfaction.     Festus Barren

## 2020-05-07 NOTE — Progress Notes (Addendum)
PROGRESS NOTE    Cheryl Leonard  ZOX:[id number] DOB: 07/10/1945 DOA: 05/04/2020 PCP: Danella PentonMiller, Mark F, MD   Chief Complaint  Patient presents with  . Toe Pain    Brief Narrative:  Cheryl SailsVeronica S [name] a 75 year old female with past medical history significant for peripheral artery disease on Xarelto followed by vascular surgery outpatient, recent right fourth toe injury followed by podiatry, essential hypertension, type 2 diabetes mellitus, CKD stage IIIb, chronic normocytic anemia who was directed to [facility] ED by her podiatrist, Dr. Excell SeltzerBaker on 5/2 for concern of early gangrene affecting her right fourth toe. Patient reports injury to that toe several weeks ago and was on oral antibiotics with Keflex over the past 7 days with some improvement initially; although she noted day of admission that her toe was dark in appearance. Patient denies any pain, but underlying significant peripheral neuropathy.  MRI positive for fourth toe osteomyelitis. Patient was admitted at podiatrist request, started on ceftriaxone. Going for angiography/angioplasty of lower extremities tomorrow followed by 4th toe amputation on Friday.   Assessment & Plan:   Principal Problem:   Gangrene of toe of right foot (HCC) Active Problems:   Iron deficiency anemia   Diabetes mellitus type 2, uncontrolled (HCC)   Hyperlipidemia, mixed   Benign essential hypertension   Peripheral vascular disease, unspecified (HCC)   Acquired hypothyroidism   DM type 2 with diabetic peripheral neuropathy (HCC)   Stage 3b chronic kidney disease (HCC)  1 right fourth toe osteomyelitis with gangrenous wound, POA -Patient for angiography/angioplasty with vascular surgery today prior to proceeding with fourth toe amputation per podiatry on Friday, 05/08/2020. -Blood cultures with no growth to date. -Continue current pain management. -IV Rocephin. -Per vascular and podiatry.  2.  Peripheral arterial disease -Patient with history of  prior bilateral femoral endarterectomies March 2020, stent to the left SFA September 2020, stent to right popliteal/SFA October 2020. -Patient being followed closely with vascular surgery,Dr Wyn Quakerew. -Patient for repeat angiography/angioplasty today -Was on aspirin and Xarelto at home. -Xarelto on hold pending possible surgical intervention. -Continue statin.  3.  Hypertension  -Noted to be on metoprolol, olmesartan, amlodipine prior to admission. -Home losartan held initially due to hyperkalemia -Continue Norvasc 5 mg daily, Toprol-XL.   -If further blood pressure control is needed could uptitrate Norvasc to 10 mg daily.  4.  Type 2 diabetes mellitus with neuropathy -Hemoglobin A1c 6.3(05/04/2020) -CBG 157 this morning. -Patient currently n.p.o. in anticipation of angioplasty, patient likely to be n.p.o. after midnight in anticipation of possible amputation. -Hold oral hypoglycemic agents. -Decrease Lantus to 15 units daily. -SSI. -Continue Neurontin.  5.  Chronic kidney disease stage IIIb -Stable at baseline.  6.  Chronic normocytic anemia -Hemoglobin stable. -Transfusion threshold hemoglobin < 7.  7.  Hypothyroidism -Synthroid.  8.  Hyperlipidemia -Continue statin.  9.  Depression/anxiety -Continue Cymbalta and as needed Xanax.  10.  Gastroesophageal reflux disease -Continue Protonix, Pepcid.   DVT prophylaxis: Lovenox Code Status: Full Family Communication: Updated patient.  No family at bedside Disposition:   Status is: Inpatient    Dispo:  Patient From: Home  Planned Disposition: Home  Medically stable for discharge: No         Consultants:   Vascular surgery: Dr. Wyn Quakerew 05/05/2020  Podiatry: Dr. Ether GriffinsFowler 05/05/2020  Procedures:   Plain films of the right foot 05/04/2020  MRI right foot 05/05/2020  Angiogram pending per vascular surgery 05/07/2020  Antimicrobials:   IV Rocephin 05/04/2020>>>>   Subjective: Patient sitting up in  bed.  Denies any chest  pain.  No shortness of breath.  States has been using incentive spirometry.  Awaiting vascular evaluation/procedure today.  Objective: Vitals:   05/06/20 2011 05/07/20 0006 05/07/20 0445 05/07/20 0848  BP: (!) 146/59 137/62 (!) 137/57 (!) 145/64  Pulse: 75 74 78 73  Resp: 17 17 17 14   Temp: 98.7 F (37.1 C) 98.6 F (37 C) 98.3 F (36.8 C) 98.6 F (37 C)  TempSrc: Oral Oral Oral Oral  SpO2: 99% 96% 98% 100%  Weight:      Height:        Intake/Output Summary (Last 24 hours) at 05/07/2020 1035 Last data filed at 05/06/2020 1852 Gross per 24 hour  Intake 480 ml  Output --  Net 480 ml   Filed Weights   05/04/20 1548  Weight: 72.6 kg    Examination:  General exam: Appears calm and comfortable  Respiratory system: Clear to auscultation. Respiratory effort normal. Cardiovascular system: S1 & S2 heard, RRR. No JVD, murmurs, rubs, gallops or clicks. No pedal edema. Gastrointestinal system: Abdomen is nondistended, soft and nontender. No organomegaly or masses felt. Normal bowel sounds heard. Central nervous system: Alert and oriented. No focal neurological deficits. Extremities: Right foot bandaged. Skin: No rashes, lesions or ulcers Psychiatry: Judgement and insight appear normal. Mood & affect appropriate.     Data Reviewed: I have personally reviewed following labs and imaging studies  CBC: Recent Labs  Lab 05/04/20 1553 05/05/20 0956 05/06/20 1000 05/07/20 0745  WBC 6.8 5.0 5.2 5.4  NEUTROABS 4.4  --   --   --   HGB 8.2* 7.4* 7.8* 8.2*  HCT 26.0* 23.1* 24.4* 25.6*  MCV 89.0 86.8 87.5 87.4  PLT 326 257 285 314    Basic Metabolic Panel: Recent Labs  Lab 05/04/20 1553 05/05/20 0956 05/06/20 1000 05/07/20 0745  NA 136 134* 135 141  K 5.2* 4.9 4.3 4.2  CL 106 103 105 109  CO2 21* 22 23 24   GLUCOSE 196* 376* 349* 156*  BUN 33* 27* 21 21  CREATININE 1.32* 1.27* 1.15* 1.11*  CALCIUM 9.5 9.2 8.9 9.1  MG  --  1.8  --   --   PHOS  --  3.7  --   --      GFR: Estimated Creatinine Clearance: 43.7 mL/min (A) (by C-G formula based on SCr of 1.11 mg/dL (H)).  Liver Function Tests: No results for input(s): AST, ALT, ALKPHOS, BILITOT, PROT, ALBUMIN in the last 168 hours.  CBG: Recent Labs  Lab 05/06/20 0817 05/06/20 1055 05/06/20 1555 05/06/20 2124 05/07/20 0847  GLUCAP 142* 308* 172* 180* 157*     Recent Results (from the past 240 hour(s))  SARS CORONAVIRUS 2 (TAT 6-24 HRS) Nasopharyngeal Nasopharyngeal Swab     Status: None   Collection Time: 05/04/20  5:59 PM   Specimen: Nasopharyngeal Swab  Result Value Ref Range Status   SARS Coronavirus 2 NEGATIVE NEGATIVE Final    Comment: (NOTE) SARS-CoV-2 target nucleic acids are NOT DETECTED.  The SARS-CoV-2 RNA is generally detectable in upper and lower respiratory specimens during the acute phase of infection. Negative results do not preclude SARS-CoV-2 infection, do not rule out co-infections with other pathogens, and should not be used as the sole basis for treatment or other patient management decisions. Negative results must be combined with clinical observations, patient history, and epidemiological information. The expected result is Negative.  Fact Sheet for Patients: 07/07/20  Fact Sheet for Healthcare Providers: 07/04/20  This test is not yet approved or cleared by the [place] and  has been authorized for detection and/or diagnosis of SARS-CoV-2 by FDA under an Emergency Use Authorization (EUA). This EUA will remain  in effect (meaning this test can be used) for the duration of the COVID-19 declaration under Se ction 564(b)(1) of the Act, 21 U.S.C. section 360bbb-3(b)(1), unless the authorization is terminated or revoked sooner.  Performed at [facility], [address], [place], [place] [address]   CULTURE, BLOOD (ROUTINE X 2) w Reflex to ID Panel     Status: None (Preliminary  result)   Collection Time: 05/04/20  9:06 PM   Specimen: BLOOD  Result Value Ref Range Status   Specimen Description BLOOD BLOOD LEFT HAND  Final   Special Requests   Final    BOTTLES DRAWN AEROBIC AND ANAEROBIC Blood Culture adequate volume   Culture   Final    NO GROWTH 3 DAYS Performed at [facility], [address], [place], [place] [address]    Report Status PENDING  Incomplete  CULTURE, BLOOD (ROUTINE X 2) w Reflex to ID Panel     Status: None (Preliminary result)   Collection Time: 05/04/20  9:06 PM   Specimen: BLOOD  Result Value Ref Range Status   Specimen Description BLOOD BLOOD LEFT FOREARM  Final   Special Requests   Final    BOTTLES DRAWN AEROBIC AND ANAEROBIC Blood Culture adequate volume   Culture   Final    NO GROWTH 3 DAYS Performed at [facility], [address] [place], [place] [address]    Report Status PENDING  Incomplete         Radiology Studies: MR FOOT RIGHT WO CONTRAST  Result Date: 05/05/2020 CLINICAL DATA:  Foot swelling and diabetes. Wound infection along the right fourth toe EXAM: MRI OF THE RIGHT FOREFOOT WITHOUT CONTRAST TECHNIQUE: Multiplanar, multisequence MR imaging of the right forefoot was performed. No intravenous contrast was administered. COMPARISON:  Foot radiographs of 05/04/2020 FINDINGS: Bones/Joint/Cartilage Abnormal high T2 and low T1 signal in the proximal, middle, and distal phalanges of the fourth toe, highly suspicious in this clinical context for active osteomyelitis. Subtle endosteal edema along the medial first digit sesamoid is probably attributable to the severe degree of degenerative arthropathy between the first metatarsal head and the sesamoids. Notable erosions of the first metatarsal head especially medially, raising suspicion for gout arthropathy. Erosion or degenerative subcortical cyst along the medial base of the lateral cuneiform, image 5 series 10. Dorsal talonavicular joint effusion with mild  spurring. Small anterior tibiotalar joint effusion. Deformity along the base of the proximal phalanx of the small toe, posttraumatic versus due to chronic arthropathy. Ligaments Lisfranc ligament intact Muscles and Tendons Low-level accentuated T2 signal diffusely through the regional plantar musculature, probably neurogenic. Soft tissues Dorsal subcutaneous edema in the foot extending into the toes. Edema is most striking in the fourth toe and probably represent cellulitis. IMPRESSION: 1. Abnormal osseous edema in the phalanges of the fourth toe highly suspicious in this clinical context for acute osteomyelitis. Surrounding cellulitis in the fourth toe. 2. Notable erosions in the head of the first metatarsal, probably from gout. 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 3. Degenerative subcortical cyst or erosion along the medial base of the lateral cuneiform. 4. Small effusions of the talonavicular and tibiotalar articulations seen on the sagittal images. 5. Potential low-grade sesamoiditis of the medial sesamoid of the first  digit. However this may be due to the underlying arthropathy. 6. Chronic appearing deformity of the base of the proximal phalanx small toe, likely posttraumatic. Electronically Signed   By: Gaylyn Rong M.D.   On: 05/05/2020 17:33        Scheduled Meds: . amLODipine  5 mg Oral Daily  . Chlorhexidine Gluconate Cloth  6 each Topical Daily  . DULoxetine  30 mg Oral Daily  . enoxaparin (LOVENOX) injection  40 mg Subcutaneous Q24H  . famotidine  40 mg Oral BID  . gabapentin  100 mg Oral Daily  . insulin aspart  0-5 Units Subcutaneous QHS  . insulin aspart  0-9 Units Subcutaneous TID WC  . insulin aspart  3 Units Subcutaneous TID WC  . insulin glargine  25 Units Subcutaneous QHS  . levothyroxine  100 mcg Oral QAC breakfast  . metoprolol succinate  50 mg Oral Daily  . pantoprazole  40 mg Oral BH-q7a  . rosuvastatin  20 mg  Oral BH-q7a   Continuous Infusions: . sodium chloride 75 mL/hr at 05/07/20 0122  . cefTRIAXone (ROCEPHIN)  IV 2 g (05/06/20 2227)     LOS: 3 days    Time spent: 35 minutes    Ramiro Harvest, MD [facility]   To contact the attending provider between 7A-7P or the covering provider during after hours 7P-7A, please log into the web site [email or web address] and access using universal [place] password for that web site. If you do not have the password, please call the hospital operator.  05/07/2020, 10:35 AM

## 2020-05-07 NOTE — Plan of Care (Signed)

## 2020-05-08 ENCOUNTER — Encounter: Payer: Self-pay | Admitting: Urology

## 2020-05-08 ENCOUNTER — Inpatient Hospital Stay: Payer: Medicare Other | Admitting: Anesthesiology

## 2020-05-08 ENCOUNTER — Encounter
Admission: EM | Disposition: A | Discharge status: Home / Self Care | Payer: Self-pay | Source: Home / Self Care | Attending: Hospitalist

## 2020-05-08 ENCOUNTER — Encounter: Payer: Self-pay | Admitting: Internal Medicine

## 2020-05-08 ENCOUNTER — Inpatient Hospital Stay: Payer: Medicare Other

## 2020-05-08 DIAGNOSIS — I96 Gangrene, not elsewhere classified: Secondary | ICD-10-CM | POA: Diagnosis not present

## 2020-05-08 DIAGNOSIS — B999 Unspecified infectious disease: Secondary | ICD-10-CM | POA: Diagnosis not present

## 2020-05-08 HISTORY — PX: AMPUTATION TOE: SHX6595

## 2020-05-08 LAB — CBC WITH DIFFERENTIAL/PLATELET
Abs Immature Granulocytes: 0.04 10*3/uL (ref 0.00–0.07)
Basophils Absolute: 0 10*3/uL (ref 0.0–0.1)
Basophils Relative: 0 %
Eosinophils Absolute: 0.1 10*3/uL (ref 0.0–0.5)
Eosinophils Relative: 2 %
HCT: 25.4 % — ABNORMAL LOW (ref 36.0–46.0)
Hemoglobin: 8.1 g/dL — ABNORMAL LOW (ref 12.0–15.0)
Immature Granulocytes: 1 %
Lymphocytes Relative: 12 %
Lymphs Abs: 0.8 10*3/uL (ref 0.7–4.0)
MCH: 28.2 pg (ref 26.0–34.0)
MCHC: 31.9 g/dL (ref 30.0–36.0)
MCV: 88.5 fL (ref 80.0–100.0)
Monocytes Absolute: 0.5 10*3/uL (ref 0.1–1.0)
Monocytes Relative: 7 %
Neutro Abs: 5.2 10*3/uL (ref 1.7–7.7)
Neutrophils Relative %: 78 %
Platelets: 346 10*3/uL (ref 150–400)
RBC: 2.87 MIL/uL — ABNORMAL LOW (ref 3.87–5.11)
RDW: 18.1 % — ABNORMAL HIGH (ref 11.5–15.5)
WBC: 6.8 10*3/uL (ref 4.0–10.5)
nRBC: 0.3 % — ABNORMAL HIGH (ref 0.0–0.2)

## 2020-05-08 LAB — BASIC METABOLIC PANEL
Anion gap: 7 (ref 5–15)
BUN: 22 mg/dL (ref 8–23)
CO2: 24 mmol/L (ref 22–32)
Calcium: 8.7 mg/dL — ABNORMAL LOW (ref 8.9–10.3)
Chloride: 107 mmol/L (ref 98–111)
Creatinine, Ser: 0.98 mg/dL (ref 0.44–1.00)
GFR, Estimated: >60 mL/min (ref >60)
Glucose, Bld: 184 mg/dL — ABNORMAL HIGH (ref 70–99)
Potassium: 3.8 mmol/L (ref 3.5–5.1)
Sodium: 138 mmol/L (ref 135–145)

## 2020-05-08 LAB — GLUCOSE, CAPILLARY
Glucose-Capillary: 146 mg/dL — ABNORMAL HIGH (ref 70–99)
Glucose-Capillary: 160 mg/dL — ABNORMAL HIGH (ref 70–99)
Glucose-Capillary: 263 mg/dL — ABNORMAL HIGH (ref 70–99)
Glucose-Capillary: 206 mg/dL — ABNORMAL HIGH (ref 70–99)
Glucose-Capillary: 111 mg/dL — ABNORMAL HIGH (ref 70–99)

## 2020-05-08 LAB — MAGNESIUM: Magnesium: 2 mg/dL (ref 1.7–2.4)

## 2020-05-08 IMAGING — DX DG FOOT COMPLETE 3+V*R*
3 series · 3 of 3 positions shown · non-contrast
Comparison: Plain films right foot [DATE].

CLINICAL DATA: Patient status post amputation of the right fourth
toe today.

EXAM:
RIGHT FOOT COMPLETE - 3+ VIEW

[foot ap]
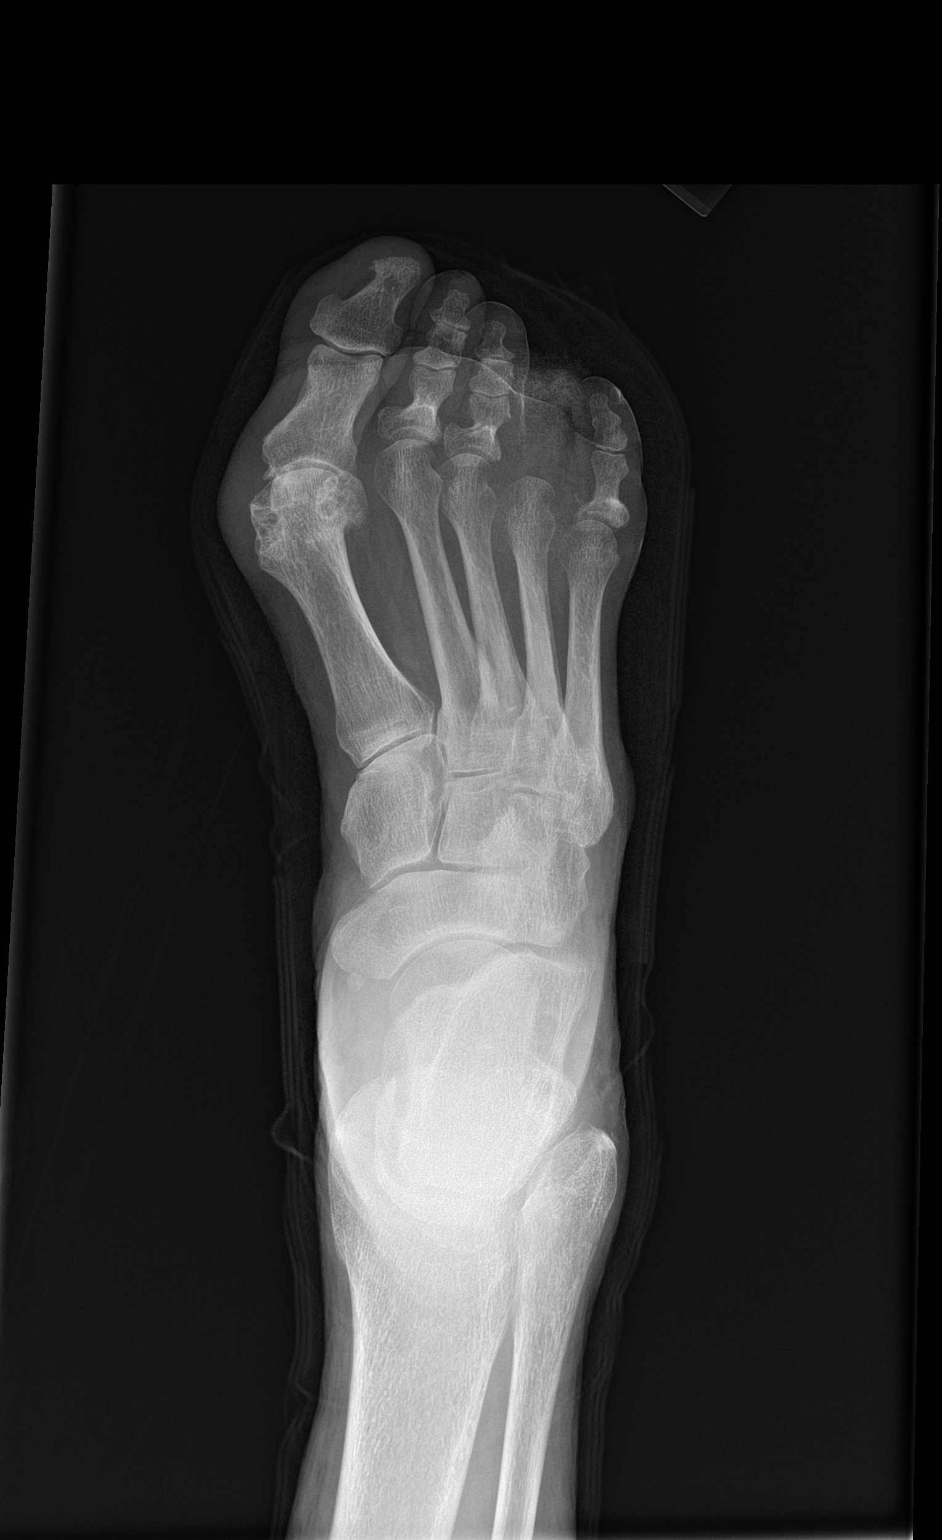

[foot obl]
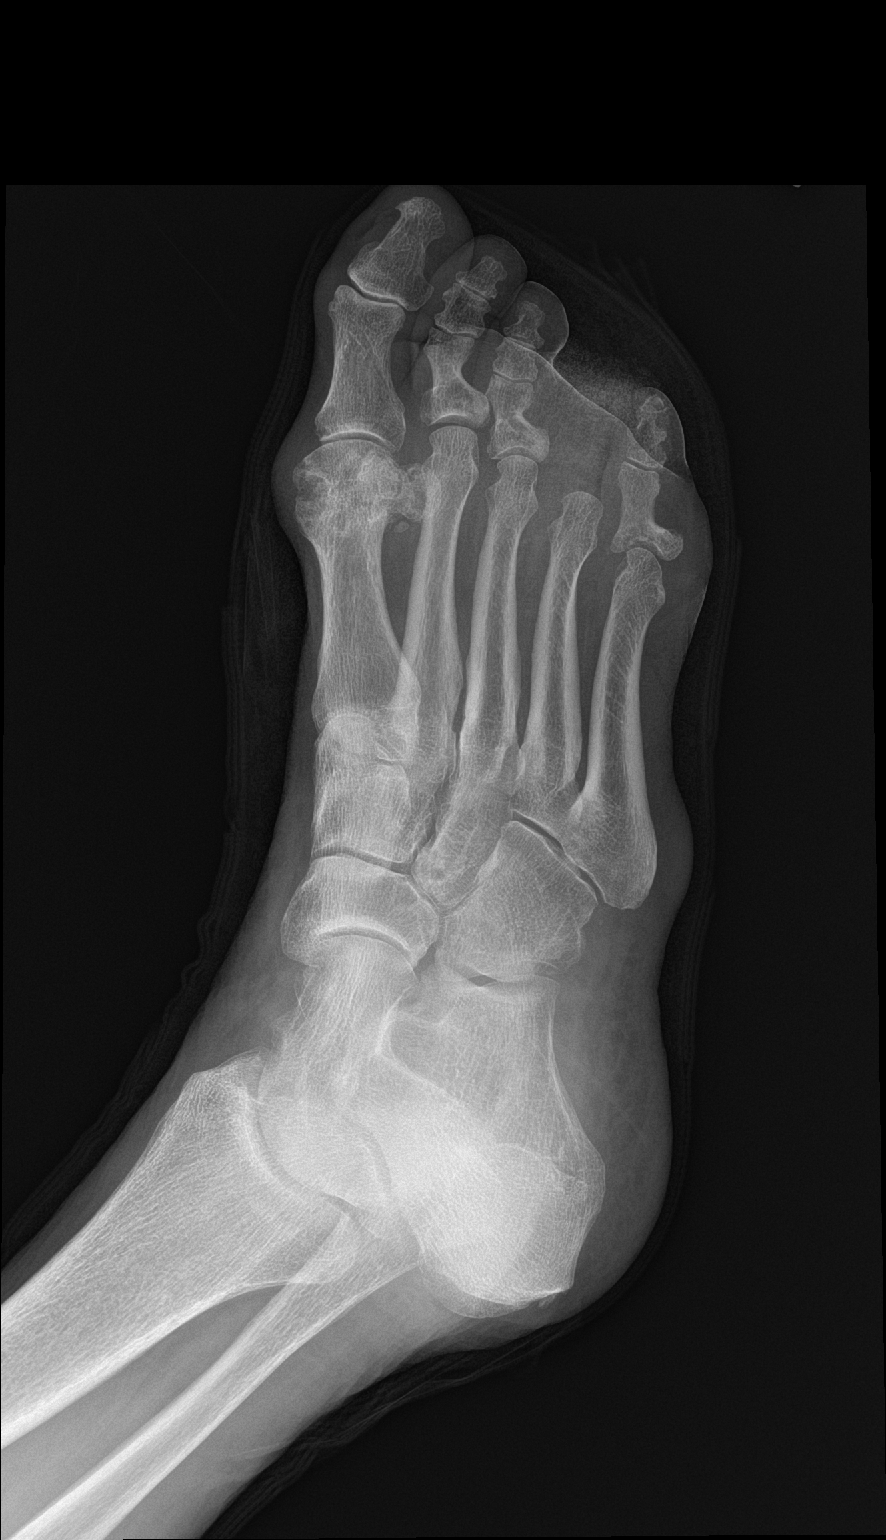

[foot lat]
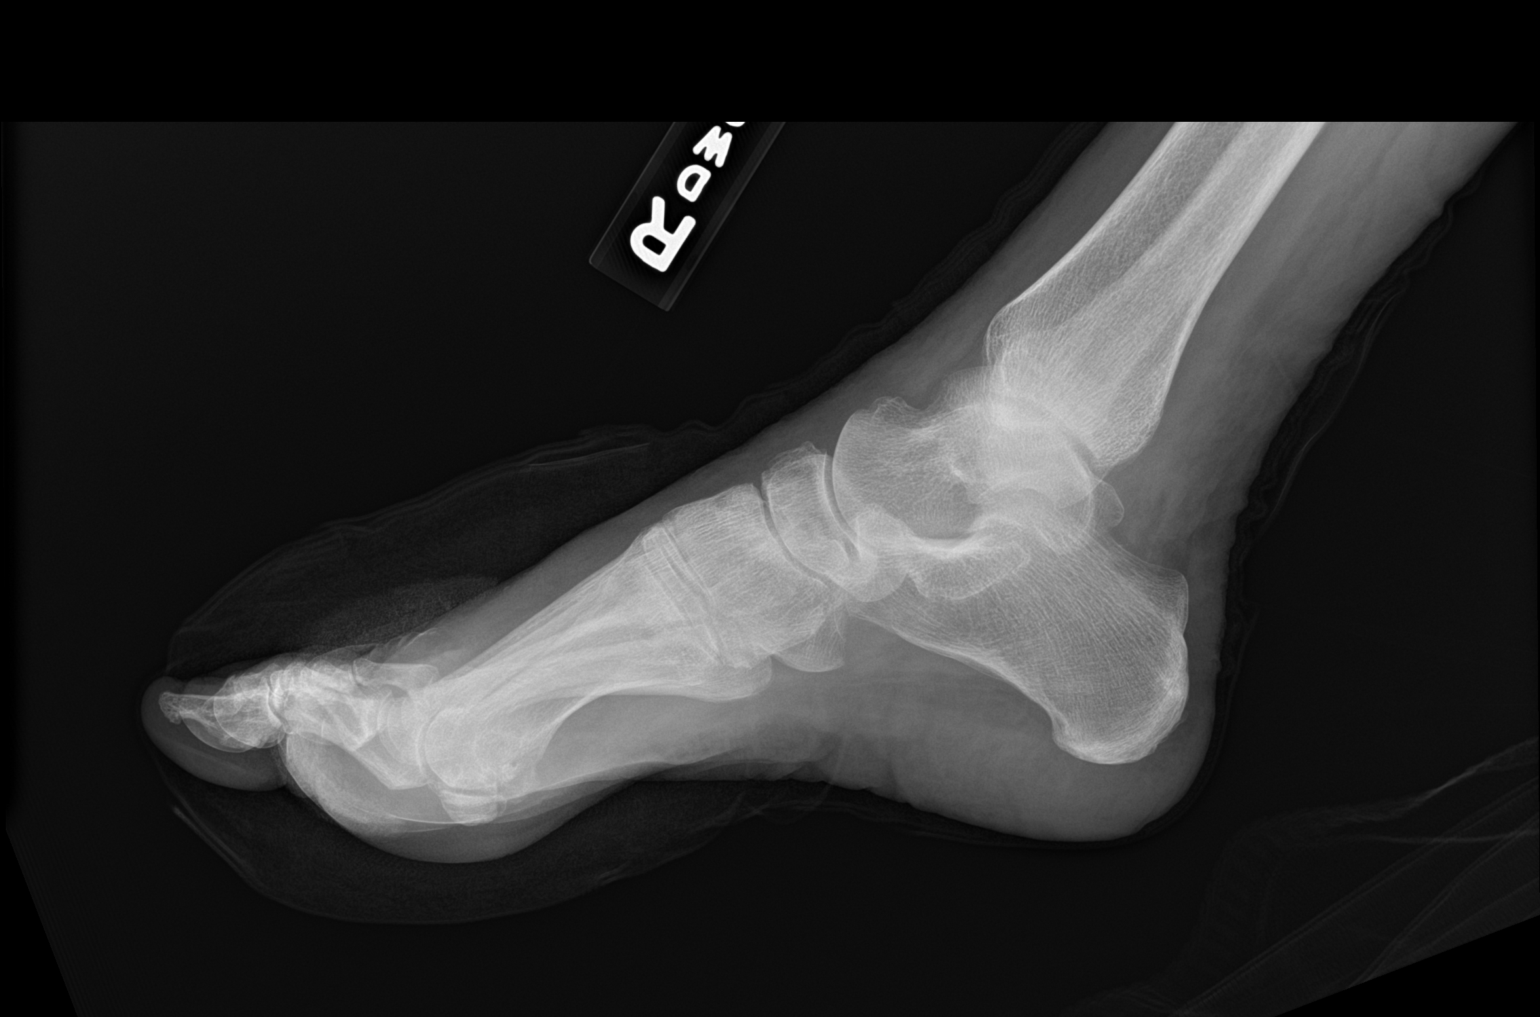

[3 of 3 positions shown; findings below may reference images not displayed]

FINDINGS: The right fourth toe has been amputated. No acute bony or joint
abnormality is seen. Hallux valgus and advanced first MTP
osteoarthritis noted. No unexpected radiopaque foreign body.
IMPRESSION: Status post amputation of the fourth toe.  No acute abnormality.

Hallux valgus and first MTP osteoarthritis.

## 2020-05-08 SURGERY — AMPUTATION, TOE
Anesthesia: General | Site: Toe | Laterality: Right

## 2020-05-08 MED ORDER — PROPOFOL 10 MG/ML IV BOLUS
INTRAVENOUS | Status: AC
  Filled 2020-05-08: qty 20

## 2020-05-08 MED ORDER — FENTANYL CITRATE (PF) 100 MCG/2ML IJ SOLN: 25.0000 ug | INTRAMUSCULAR | Status: DC | PRN

## 2020-05-08 MED ORDER — PROPOFOL 500 MG/50ML IV EMUL
INTRAVENOUS | Status: DC | PRN
  Administered 2020-05-08: 200 ug/kg/min via INTRAVENOUS

## 2020-05-08 MED ORDER — NEOMYCIN-POLYMYXIN B GU 40-200000 IR SOLN
Status: DC | PRN
  Administered 2020-05-08: 2 mL

## 2020-05-08 MED ORDER — LIDOCAINE HCL 1 % IJ SOLN
INTRAMUSCULAR | Status: DC | PRN
  Administered 2020-05-08: 10 mL

## 2020-05-08 MED ORDER — ONDANSETRON HCL 4 MG/2ML IJ SOLN: 4.0000 mg | Freq: Once | INTRAMUSCULAR | Status: DC | PRN

## 2020-05-08 MED ORDER — SODIUM CHLORIDE 0.9 % IV SOLN
INTRAVENOUS | Status: DC | PRN
  Administered 2020-05-08: 250 mL via INTRAVENOUS

## 2020-05-08 SURGICAL SUPPLY — 47 items
BLADE MED AGGRESSIVE (BLADE) ×1 IMPLANT
BLADE OSC/SAGITTAL MD 5.5X18 (BLADE) IMPLANT
BLADE SURG 15 STRL LF DISP TIS (BLADE) IMPLANT
BLADE SURG 15 STRL SS (BLADE)
BLADE SURG MINI STRL (BLADE) IMPLANT
BNDG CONFORM 2 STRL LF (GAUZE/BANDAGES/DRESSINGS) IMPLANT
BNDG ELASTIC 4X5.8 VLCR STR LF (GAUZE/BANDAGES/DRESSINGS) ×2 IMPLANT
BNDG ESMARK 4X12 TAN STRL LF (GAUZE/BANDAGES/DRESSINGS) ×2 IMPLANT
BNDG GAUZE 4.5X4.1 6PLY STRL (MISCELLANEOUS) ×2 IMPLANT
CANISTER SUCT 1200ML W/VALVE (MISCELLANEOUS) ×1 IMPLANT
CNTNR SPEC 2.5X3XGRAD LEK (MISCELLANEOUS)
CONT SPEC 4OZ STER OR WHT (MISCELLANEOUS)
CONTAINER SPEC 2.5X3XGRAD LEK (MISCELLANEOUS) ×1 IMPLANT
COVER WAND RF STERILE (DRAPES) ×2 IMPLANT
CUFF TOURN SGL QUICK 12 (TOURNIQUET CUFF) IMPLANT
CUFF TOURN SGL QUICK 18X4 (TOURNIQUET CUFF) ×1 IMPLANT
DRAPE FLUOR MINI C-ARM 54X84 (DRAPES) IMPLANT
DURAPREP 26ML APPLICATOR (WOUND CARE) ×2 IMPLANT
ELECT REM PT RETURN 9FT ADLT (ELECTROSURGICAL) ×2
ELECTRODE REM PT RTRN 9FT ADLT (ELECTROSURGICAL) ×1 IMPLANT
GAUZE SPONGE 4X4 12PLY STRL (GAUZE/BANDAGES/DRESSINGS) ×4 IMPLANT
GAUZE XEROFORM 1X8 LF (GAUZE/BANDAGES/DRESSINGS) ×2 IMPLANT
GLOVE SURG ENC MOIS LTX SZ7 (GLOVE) ×2 IMPLANT
GLOVE SURG UNDER LTX SZ7 (GLOVE) ×2 IMPLANT
GOWN STRL REUS W/ TWL LRG LVL3 (GOWN DISPOSABLE) ×2 IMPLANT
GOWN STRL REUS W/TWL LRG LVL3 (GOWN DISPOSABLE) ×2
HANDPIECE VERSAJET DEBRIDEMENT (MISCELLANEOUS) IMPLANT
IV NS IRRIG 3000ML ARTHROMATIC (IV SOLUTION) IMPLANT
KIT TURNOVER KIT A (KITS) ×2 IMPLANT
LABEL OR SOLS (LABEL) ×2 IMPLANT
MANIFOLD NEPTUNE II (INSTRUMENTS) ×2 IMPLANT
NDL FILTER BLUNT 18X1 1/2 (NEEDLE) ×1 IMPLANT
NDL HYPO 25X1 1.5 SAFETY (NEEDLE) ×2 IMPLANT
NEEDLE FILTER BLUNT 18X 1/2SAF (NEEDLE) ×1
NEEDLE FILTER BLUNT 18X1 1/2 (NEEDLE) ×1 IMPLANT
NEEDLE HYPO 25X1 1.5 SAFETY (NEEDLE) ×4 IMPLANT
NS IRRIG 500ML POUR BTL (IV SOLUTION) ×2 IMPLANT
PACK EXTREMITY ARMC (MISCELLANEOUS) ×2 IMPLANT
SOL PREP PVP 2OZ (MISCELLANEOUS) ×2
SOLUTION PREP PVP 2OZ (MISCELLANEOUS) ×1 IMPLANT
STOCKINETTE STRL 6IN 960660 (GAUZE/BANDAGES/DRESSINGS) ×2 IMPLANT
SUT ETHILON 3-0 FS-10 30 BLK (SUTURE) ×2
SUT VIC AB 3-0 SH 27 (SUTURE) ×1
SUT VIC AB 3-0 SH 27X BRD (SUTURE) ×1 IMPLANT
SUTURE EHLN 3-0 FS-10 30 BLK (SUTURE) ×2 IMPLANT
SWAB CULTURE AMIES ANAERIB BLU (MISCELLANEOUS) ×1 IMPLANT
SYR 10ML LL (SYRINGE) ×2 IMPLANT

## 2020-05-08 NOTE — Progress Notes (Signed)
PROGRESS NOTE    Cheryl Leonard  LKJ:[id number] DOB: [birth year] DOA: 05/04/2020 PCP: Danella Penton, MD   Chief Complaint  Patient presents with  . Toe Pain    Brief Narrative:  Cheryl Leonard a 75 year old female with past medical history significant for peripheral artery disease on Xarelto followed by vascular surgery outpatient, recent right fourth toe injury followed by podiatry, essential hypertension, type 2 diabetes mellitus, CKD stage IIIb, chronic normocytic anemia who was directed to [facility] ED by her podiatrist, Dr. Excell Seltzer on 5/2 for concern of early gangrene affecting her right fourth toe. Patient reports injury to that toe several weeks ago and was on oral antibiotics with Keflex over the past 7 days with some improvement initially; although she noted day of admission that her toe was dark in appearance. Patient denies any pain, but underlying significant peripheral neuropathy.  MRI positive for fourth toe osteomyelitis. Patient was admitted at podiatrist request, started on ceftriaxone. Going for angiography/angioplasty of lower extremities tomorrow followed by 4th toe amputation on Friday.   Assessment & Plan:   Principal Problem:   Gangrene of toe of right foot (HCC) Active Problems:   Iron deficiency anemia   Diabetes mellitus type 2, uncontrolled (HCC)   Hyperlipidemia, mixed   Benign essential hypertension   Peripheral vascular disease, unspecified (HCC)   Acquired hypothyroidism   DM type 2 with diabetic peripheral neuropathy (HCC)   Stage 3b chronic kidney disease (HCC)  1 right fourth toe osteomyelitis with gangrenous wound, POA S/p fourth toe amputation on 05/08/2020. -Blood cultures with no growth to date. Plan: --cont ceftriaxone --surgical shoe --PT  2.  Peripheral arterial disease -Patient with history of prior bilateral femoral endarterectomies March 2020, stent to the left SFA September 2020, stent to right popliteal/SFA October  2020. -Patient being followed closely with vascular surgery,Dr Wyn Quaker. -Was on aspirin and Xarelto at home. -Xarelto on hold pending possible surgical intervention. Plan: --angiogram today, no intervention required -Continue statin.  3.  Hypertension  -Noted to be on metoprolol, olmesartan, amlodipine prior to admission. -Home losartan held initially due to hyperkalemia Plan: --cont amlodipine and Toprol --hold losartan  4.  Type 2 diabetes mellitus with neuropathy -Hemoglobin A1c 6.3(05/04/2020) -Hold oral hypoglycemic agents. --cont Lantus 15u daily --mealtime 3u TID SSI -Continue Neurontin.  5.  Chronic kidney disease stage IIIb -Stable at baseline.  6.  Chronic normocytic anemia -Hemoglobin stable. -Transfusion threshold hemoglobin < 7.  7.  Hypothyroidism -Synthroid.  8.  Hyperlipidemia -Continue statin.  9.  Depression/anxiety -Continue Cymbalta and as needed Xanax.  10.  Gastroesophageal reflux disease -Continue Protonix, Pepcid.   DVT prophylaxis: Lovenox Code Status: Full Family Communication:  Disposition:   Status is: Inpatient    Dispo:  Patient From: Home  Planned Disposition: Home  Medically stable for discharge: No, still need PT eval and podiatry clearance       Consultants:   Vascular surgery: Dr. Wyn Quaker 05/05/2020  Podiatry: Dr. Ether Griffins 05/05/2020  Procedures:   Plain films of the right foot 05/04/2020  MRI right foot 05/05/2020  Angiogram pending per vascular surgery 05/07/2020  Antimicrobials:   IV Rocephin 05/04/2020>>>>   Subjective: Pt reported intermittent shocky pain in his foot.  Normal oral intake, urination and BM.   Objective: Vitals:   05/08/20 0845 05/08/20 0915 05/08/20 1008 05/08/20 1606  BP: (!) 140/51  (!) 135/96 (!) 139/55  Pulse: 66 65 68 71  Resp: 17 18  16   Temp: (!) 97.5 F (36.4 C)  98.6 F (37 C)  TempSrc:      SpO2: 97% 98% 96% 98%  Weight:      Height:        Intake/Output Summary (Last 24 hours)  at 05/08/2020 1638 Last data filed at 05/08/2020 0749 Gross per 24 hour  Intake 1480.24 ml  Output 502 ml  Net 978.24 ml   Filed Weights   05/04/20 1548 05/08/20 0704  Weight: 72.6 kg 72.6 kg    Examination:  Constitutional: NAD, AAOx3 HEENT: conjunctivae and lids normal, EOMI CV: No cyanosis.   RESP: normal respiratory effort, on RA Extremities: right foot ACE wrapped SKIN: warm, dry Neuro: II - XII grossly intact.   Psych: Normal mood and affect.  Appropriate judgement and reason   Data Reviewed: I have personally reviewed following labs and imaging studies  CBC: Recent Labs  Lab 05/04/20 1553 05/05/20 0956 05/06/20 1000 05/07/20 0745 05/08/20 0437  WBC 6.8 5.0 5.2 5.4 6.8  NEUTROABS 4.4  --   --   --  5.2  HGB 8.2* 7.4* 7.8* 8.2* 8.1*  HCT 26.0* 23.1* 24.4* 25.6* 25.4*  MCV 89.0 86.8 87.5 87.4 88.5  PLT 326 257 285 314 346    Basic Metabolic Panel: Recent Labs  Lab 05/04/20 1553 05/05/20 0956 05/06/20 1000 05/07/20 0745 05/08/20 0437  NA 136 134* 135 141 138  K 5.2* 4.9 4.3 4.2 3.8  CL 106 103 105 109 107  CO2 21* 22 23 24 24   GLUCOSE 196* 376* 349* 156* 184*  BUN 33* 27* 21 21 22   CREATININE 1.32* 1.27* 1.15* 1.11* 0.98  CALCIUM 9.5 9.2 8.9 9.1 8.7*  MG  --  1.8  --   --  2.0  PHOS  --  3.7  --   --   --     GFR: Estimated Creatinine Clearance: 49.5 mL/min (by C-G formula based on SCr of 0.98 mg/dL).  Liver Function Tests: No results for input(s): AST, ALT, ALKPHOS, BILITOT, PROT, ALBUMIN in the last 168 hours.  CBG: Recent Labs  Lab 05/07/20 2100 05/08/20 0708 05/08/20 0919 05/08/20 1130 05/08/20 1608  GLUCAP 189* 146* 160* 263* 206*     Recent Results (from the past 240 hour(s))  SARS CORONAVIRUS 2 (TAT 6-24 HRS) Nasopharyngeal Nasopharyngeal Swab     Status: None   Collection Time: 05/04/20  5:59 PM   Specimen: Nasopharyngeal Swab  Result Value Ref Range Status   SARS Coronavirus 2 NEGATIVE NEGATIVE Final    Comment:  (NOTE) SARS-CoV-2 target nucleic acids are NOT DETECTED.  The SARS-CoV-2 RNA is generally detectable in upper and lower respiratory specimens during the acute phase of infection. Negative results do not preclude SARS-CoV-2 infection, do not rule out co-infections with other pathogens, and should not be used as the sole basis for treatment or other patient management decisions. Negative results must be combined with clinical observations, patient history, and epidemiological information. The expected result is Negative.  Fact Sheet for Patients: HairSlick.nohttps://www.fda.gov/media/138098/download  Fact Sheet for Healthcare Providers: quierodirigir.comhttps://www.fda.gov/media/138095/download  This test is not yet approved or cleared by the [place] FDA and  has been authorized for detection and/or diagnosis of SARS-CoV-2 by FDA under an Emergency Use Authorization (EUA). This EUA will remain  in effect (meaning this test can be used) for the duration of the COVID-19 declaration under Se ction 564(b)(1) of the Act, 21 U.S.C. section 360bbb-3(b)(1), unless the authorization is terminated or revoked sooner.  Performed at [facility], [address], [place], [place] [address]   CULTURE, BLOOD (ROUTINE X 2) w Reflex to ID Panel     Status: None (Preliminary result)   Collection Time: 05/04/20  9:06 PM   Specimen: BLOOD  Result Value Ref Range Status   Specimen Description BLOOD BLOOD LEFT HAND  Final   Special Requests   Final    BOTTLES DRAWN AEROBIC AND ANAEROBIC Blood Culture adequate volume   Culture   Final    NO GROWTH 4 DAYS Performed at [facility], [address], [place], [place] [address]    Report Status PENDING  Incomplete  CULTURE, BLOOD (ROUTINE X 2) w Reflex to ID Panel     Status: None (Preliminary result)   Collection Time: 05/04/20  9:06 PM   Specimen: BLOOD  Result Value Ref Range Status   Specimen Description BLOOD BLOOD LEFT FOREARM  Final   Special Requests    Final    BOTTLES DRAWN AEROBIC AND ANAEROBIC Blood Culture adequate volume   Culture   Final    NO GROWTH 4 DAYS Performed at [facility], [address] [place], [place] [address]    Report Status PENDING  Incomplete  Surgical PCR screen     Status: None   Collection Time: 05/07/20 10:21 PM   Specimen: Nasal Mucosa; Nasal Swab  Result Value Ref Range Status   MRSA, PCR NEGATIVE NEGATIVE Final   Staphylococcus aureus NEGATIVE NEGATIVE Final    Comment: (NOTE) The Xpert SA Assay (FDA approved for NASAL specimens in patients 74 years of age and older), is one component of a comprehensive surveillance program. It is not intended to diagnose infection nor to guide or monitor treatment. Performed at [facility], [address] [place], [place] [address]          Radiology Studies: PERIPHERAL VASCULAR CATHETERIZATION  Result Date: 05/07/2020 See op note  DG Foot Complete Right  Result Date: 05/08/2020 CLINICAL DATA:  Patient status post amputation of the right fourth toe today. EXAM: RIGHT FOOT COMPLETE - 3+ VIEW COMPARISON:  Plain films right foot 05/04/2020. FINDINGS: The right fourth toe has been amputated. No acute bony or joint abnormality is seen. 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 No unexpected radiopaque foreign body. IMPRESSION: Status post amputation of the fourth toe.  No acute abnormality. Hallux valgus and first MTP osteoarthritis. Electronically Signed   By: Drusilla Kanner M.D.   On: 05/08/2020 11:23        Scheduled Meds: . amLODipine  5 mg Oral Daily  . Chlorhexidine Gluconate Cloth  6 each Topical Daily  . DULoxetine  30 mg Oral Daily  . enoxaparin (LOVENOX) injection  40 mg Subcutaneous Q24H  . famotidine  40 mg Oral BID  . gabapentin  100 mg Oral Daily  . insulin aspart  0-5 Units Subcutaneous QHS  . insulin aspart  0-9 Units Subcutaneous TID WC  . insulin aspart  3 Units Subcutaneous TID WC  . insulin  glargine  15 Units Subcutaneous QHS  . levothyroxine  100 mcg Oral QAC breakfast  . metoprolol succinate  50 mg Oral Daily  . pantoprazole  40 mg Oral BH-q7a  . rosuvastatin  20 mg Oral BH-q7a   Continuous Infusions: . cefTRIAXone (ROCEPHIN)  IV 2 g (05/07/20 2215)     LOS: 4 days    Darlin Priestly, MD [facility]   To contact the attending provider between 7A-7P or the covering provider during after hours 7P-7A, please  log into the web site [email or web address] and access using universal [place] password for that web site. If you do not have the password, please call the hospital operator.  05/08/2020, 4:38 PM

## 2020-05-08 NOTE — Anesthesia Preprocedure Evaluation (Signed)
Anesthesia Evaluation  Patient identified by MRN, date of birth, ID band Patient awake    Reviewed: Allergy & Precautions, NPO status , Patient's Chart, lab work & pertinent test results  History of Anesthesia Complications Negative for: history of anesthetic complications  Airway Mallampati: II  TM Distance: >3 FB Neck ROM: Full    Dental  (+) Upper Dentures, Lower Dentures   Pulmonary sleep apnea , neg COPD, former smoker,    breath sounds clear to auscultation- rhonchi (-) wheezing      Cardiovascular hypertension, Pt. on medications + Peripheral Vascular Disease  (-) CAD, (-) Past MI, (-) Cardiac Stents and (-) CABG  Rhythm:Regular Rate:Normal - Systolic murmurs and - Diastolic murmurs    Neuro/Psych  Headaches, neg Seizures Anxiety    GI/Hepatic Neg liver ROS, GERD  Medicated,  Endo/Other  diabetes, Insulin DependentHypothyroidism   Renal/GU Renal InsufficiencyRenal disease     Musculoskeletal  (+) Arthritis ,   Abdominal (+) - obese,   Peds  Hematology  (+) anemia ,   Anesthesia Other Findings Past Medical History: No date: Anemia No date: Anxiety No date: Arthritis     Comment:  Gout No date: Cataracts, both eyes No date: Diabetic retinopathy (HCC)     Comment:  NPDR OU No date: Diabetic retinopathy (HCC) No date: GERD (gastroesophageal reflux disease) No date: Gout No date: Headache     Comment:  h/o migraines No date: History of fracture of patella     Comment:  right knee No date: History of positive PPD     Comment:  Patient always shows positive No date: Hyperlipidemia No date: Hypertension No date: Hypertensive retinopathy     Comment:  OU No date: Hypothyroidism 12/30/2013: Lichen sclerosus     Comment:  of vulva No date: Metatarsal fracture No date: Neuropathy No date: Peripheral vascular disease (HCC) No date: Polyneuropathy     Comment:  numbness and tingling in feet and toes No  date: Renal insufficiency     Comment:  Stage 3 No date: Sleep apnea     Comment:  does not use cpap-lost weight  No date: Type 2 diabetes mellitus, uncontrolled (HCC)   Reproductive/Obstetrics                             Anesthesia Physical  Anesthesia Plan  ASA: III  Anesthesia Plan: General   Post-op Pain Management:    Induction: Intravenous  PONV Risk Score and Plan:   Airway Management Planned: Nasal Cannula  Additional Equipment:   Intra-op Plan:   Post-operative Plan:   Informed Consent: I have reviewed the patients History and Physical, chart, labs and discussed the procedure including the risks, benefits and alternatives for the proposed anesthesia with the patient or authorized representative who has indicated his/her understanding and acceptance.     Dental advisory given  Plan Discussed with: CRNA and Anesthesiologist  Anesthesia Plan Comments:         Anesthesia Quick Evaluation

## 2020-05-08 NOTE — Op Note (Signed)
PODIATRY / FOOT AND ANKLE SURGERY OPERATIVE REPORT    SURGEON: Caroline More, DPM  PRE-OPERATIVE DIAGNOSIS:  1.  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  Diabetes type 2 polyneuropathy 3.  PVD  POST-OPERATIVE DIAGNOSIS: Same  PROCEDURE(S): 1. Right fourth toe amputation  HEMOSTASIS: Right ankle tourniquet, 14 minutes  ANESTHESIA: MAC  ESTIMATED BLOOD LOSS: 5 cc  FINDING(S): 1.  Osteomyelitis right fourth toe  PATHOLOGY/SPECIMEN(S): Right fourth toe wound culture, right fourth toe path specimen with proximal margin marked in purple ink  INDICATIONS:   Cheryl Leonard is a 75 y.o. female who presents with a nonhealing ulceration to the right fourth toe at the lateral aspect of the PIPJ due to the fourth and fifth toe rubbing against each other along with some early stage of gangrenous changes.  Patient was seen in the clinic and was subsequently admitted to the hospital due to worsening signs of infection.  Patient had CT angiogram performed which showed adequate blood flow for healing per vascular surgery.  Discussed all treatment options with patient both conservative and surgical attempts at correction include potential risks and complications at this time patient is elected for procedure consisting of right fourth toe amputation.  MRI imaging prior to surgery as well as x-ray imaging showed osteomyelitis to the right fourth toe to the base of the proximal phalanx but not within the metatarsal head.  Patient presents today for procedure.  DESCRIPTION: After obtaining full informed written consent, the patient was brought back to the operating room and placed supine upon the operating table.  The patient received IV antibiotics prior to induction.  After obtaining adequate anesthesia, a digital block was performed or ray block was performed to the right fourth ray with 10 cc of half percent 1% lidocaine plain.  The patient was prepped  and draped in the standard fashion.  An Esmarch bandage was used to exsanguinate the right lower extremity and the pneumatic ankle tourniquet was inflated.  Attention was directed to the right fourth toe where a fishmouth type of incision was made slightly distal to the fourth metatarsal phalangeal joint.  More dorsal skin was saved for flap closure due to macerated and wound type tissue being at the lateral aspect of the toe and interspace area.  This incision was made straight to bone.  At this time dissection was continued down to the fourth metatarsal phalangeal joint where the capsular and collateral ligaments as well as suspensory ligaments were released as well as the extensor digitorum longus and capsular tissue as well as plantar plate and any attachment to the flexor tendon.  The fourth toe was then disarticulated and passed off the operative site and the proximal margin was marked in purple ink.  A wound culture was also taken from the right fourth toe and passed off.  The surgical site was flushed with copious amounts normal sterile saline.  The pneumatic ankle tourniquet was deflated and a prompt hyperemic response was noted to the right foot.  The flaps were then reapproximated well coapted with 4-0 nylon in a combination of simple and horizontal mattress type stitching.  The flaps appeared to reapproximate fairly well overall.    A postoperative dressing was then applied consisting of Betadine soaked 4 x 4 to the incision line due to maceration followed by 4 x 4 gauze, Kerlix, Ace wrap.  Patient tolerated the procedure and anesthesia well and transferred to recovery room vital signs stable vascular status appearing  to be intact all toes both feet.  The patient will be discharged back to the inpatient room with the appropriate orders, medications, and instructions.  Patient is to be partial weightbearing with heel contact in a surgical shoe.  Believe that all infection was removed with amputation.   Likely consider ingestion to p.o. antibiotics for 7 days after discharge based on culture results.  Patient will have a dressing change tomorrow and podiatry team will likely sign off on patient at that point if doing well.  COMPLICATIONS: None  CONDITION: Good, stable  Caroline More, DPM

## 2020-05-08 NOTE — Transfer of Care (Signed)
Immediate Anesthesia Transfer of Care Note  Patient: Cheryl Leonard  Procedure(s) Performed: AMPUTATION TOE-Right 4th Toe (Right Toe)  Patient Location: PACU  Anesthesia Type:General  Level of Consciousness: sedated  Airway & Oxygen Therapy: Patient Spontanous Breathing and Patient connected to face mask oxygen  Post-op Assessment: Report given to RN and Post -op Vital signs reviewed and stable  Post vital signs: Reviewed and stable  Last Vitals:  Vitals Value Taken Time  BP 127/56 05/08/20 0815  Temp 36.4 C 05/08/20 0808  Pulse 66 05/08/20 0820  Resp 19 05/08/20 0820  SpO2 97 % 05/08/20 0820  Vitals shown include unvalidated device data.  Last Pain:  Vitals:   05/08/20 0704  TempSrc: Temporal  PainSc: 0-No pain         Complications: No complications documented.

## 2020-05-08 NOTE — H&P (Signed)
HISTORY AND PHYSICAL INTERVAL NOTE:  05/08/2020  7:12 AM  Cheryl Leonard  has presented today for surgery, with the diagnosis of right 4th toe osteomyelitis with associated neuropathic/diabetic foot ulceration, PVD hx, DM2 with polyneuropathy hx.  The various methods of treatment have been discussed with the patient.  No guarantees were given.  After consideration of risks, benefits and other options for treatment, the patient has consented to surgery.  I have reviewed the patients' chart and labs.   PROCEDURE: RIGHT PARTIAL 4TH RAY AMPUTATION VERSUS 4TH TOE AMPUTATION    A history and physical examination was performed in the hospital.  The patient was reexamined.  There have been no changes to this history and physical examination.  Rosetta Posner, DPM

## 2020-05-08 NOTE — Progress Notes (Signed)
[facility] Daily Progress Note  Subjective: 05/07/20: 1. Ultrasound guidance for vascular access left femoral artery 2. Catheter placement into right common femoral artery from left femoral approach 3. Aortogram and selective right lower extremity angiogram 4. StarClose closure device left femoral artery  Patient without complaint this AM.  No acute issues overnight.  Spent at bedside.  Objective: Vitals:   05/08/20 0840 05/08/20 0845 05/08/20 0915 05/08/20 1008  BP:  (!) 140/51  (!) 135/96  Pulse: 70 66 65 68  Resp: 13 17 18    Temp:  (!) 97.5 F (36.4 C)    TempSrc:      SpO2: 99% 97% 98% 96%  Weight:      Height:        Intake/Output Summary (Last 24 hours) at 05/08/2020 1218 Last data filed at 05/08/2020 0749 Gross per 24 hour  Intake 1480.24 ml  Output 502 ml  Net 978.24 ml   Physical Exam: A&Ox3, NAD CV: RRR Pulmonary: CTA Bilaterally Abdomen: Soft, Nontender, Nondistended Access site:  Clean dry intact.  No drainage or swelling. Vascular:  Right lower extremity: Thigh soft.  Calf soft.  Extremities warm distally toes.   Laboratory: CBC    Component Value Date/Time   WBC 6.8 05/08/2020 0437   HGB 8.1 (L) 05/08/2020 0437   HCT 25.4 (L) 05/08/2020 0437   PLT 346 05/08/2020 0437   BMET    Component Value Date/Time   NA 138 05/08/2020 0437   K 3.8 05/08/2020 0437   CL 107 05/08/2020 0437   CO2 24 05/08/2020 0437   GLUCOSE 184 (H) 05/08/2020 0437   BUN 22 05/08/2020 0437   CREATININE 0.98 05/08/2020 0437   CALCIUM 8.7 (L) 05/08/2020 0437   GFRNONAA >60 05/08/2020 0437   GFRAA 45 (L) 09/04/2019 1249   Assessment/Planning: The patient is a 75 year old female who presented with pain and discoloration to the fourth toe on her right foot however yesterday's angiogram was diagnostic  1) Diagnostic angiogram. No intervention required. Arterial patency stable. 2) Patient is on aspirin and  statin for medical management 3) Will continue to surveil the patient's disease as per her normal routine follow-ups 4) No further recommendations from vascular surgery at this time.  Vascular surgery will sign off.  Discussed with Dr. 61 Charleston Hankin PA-C 05/08/2020 12:18 PM

## 2020-05-09 DIAGNOSIS — I96 Gangrene, not elsewhere classified: Secondary | ICD-10-CM | POA: Diagnosis not present

## 2020-05-09 LAB — BASIC METABOLIC PANEL
Anion gap: 6 (ref 5–15)
BUN: 17 mg/dL (ref 8–23)
CO2: 24 mmol/L (ref 22–32)
Calcium: 8.6 mg/dL — ABNORMAL LOW (ref 8.9–10.3)
Chloride: 107 mmol/L (ref 98–111)
Creatinine, Ser: 0.94 mg/dL (ref 0.44–1.00)
GFR, Estimated: >60 mL/min (ref >60)
Glucose, Bld: 148 mg/dL — ABNORMAL HIGH (ref 70–99)
Potassium: 4 mmol/L (ref 3.5–5.1)
Sodium: 137 mmol/L (ref 135–145)

## 2020-05-09 LAB — CULTURE, BLOOD (ROUTINE X 2)
Culture: NO GROWTH
Culture: NO GROWTH
Special Requests: ADEQUATE
Special Requests: ADEQUATE

## 2020-05-09 LAB — CBC
HCT: 25.4 % — ABNORMAL LOW (ref 36.0–46.0)
Hemoglobin: 8 g/dL — ABNORMAL LOW (ref 12.0–15.0)
MCH: 28 pg (ref 26.0–34.0)
MCHC: 31.5 g/dL (ref 30.0–36.0)
MCV: 88.8 fL (ref 80.0–100.0)
Platelets: 308 10*3/uL (ref 150–400)
RBC: 2.86 MIL/uL — ABNORMAL LOW (ref 3.87–5.11)
RDW: 18.2 % — ABNORMAL HIGH (ref 11.5–15.5)
WBC: 5.9 10*3/uL (ref 4.0–10.5)
nRBC: 0 % (ref 0.0–0.2)

## 2020-05-09 LAB — MAGNESIUM: Magnesium: 2.2 mg/dL (ref 1.7–2.4)

## 2020-05-09 LAB — GLUCOSE, CAPILLARY
Glucose-Capillary: 155 mg/dL — ABNORMAL HIGH (ref 70–99)
Glucose-Capillary: 304 mg/dL — ABNORMAL HIGH (ref 70–99)

## 2020-05-09 MED ORDER — ZOLPIDEM TARTRATE 5 MG PO TABS: 5.0000 mg | ORAL_TABLET | Freq: Every evening | ORAL | 0 refills | Status: DC | PRN

## 2020-05-09 MED ORDER — CIPROFLOXACIN HCL 500 MG PO TABS: 500.0000 mg | ORAL_TABLET | Freq: Two times a day (BID) | ORAL | 0 refills | Status: AC

## 2020-05-09 MED ORDER — AMOXICILLIN-POT CLAVULANATE 875-125 MG PO TABS: 1.0000 | ORAL_TABLET | Freq: Two times a day (BID) | ORAL | 0 refills | Status: AC

## 2020-05-09 NOTE — Progress Notes (Signed)
Patient discharging home. Instructions given to patient and husband. Verbalized understanding. Husband to transport patient home.

## 2020-05-09 NOTE — Discharge Summary (Signed)
Physician Discharge Summary   Cheryl Leonard  female DOB: [birth year]  PPJ:[id number]  PCP: Danella Penton, MD  Admit date: 05/04/2020 Discharge date: 05/09/2020  Admitted From: home Disposition:  home CODE STATUS: Full code  Discharge Instructions    Diet - low sodium heart healthy   Complete by: As directed    Discharge instructions   Complete by: As directed    keep the  bandage clean dry and do not remove.    Follow up with Dr. Excell Seltzer on  Wednesday.    Instructed the patient on how to walk flat-footed with pressure only on the heel and her surgical shoe.    Please take 10 more days of antibiotics with Cipro and Augmentin as directed at home.   Dr. Darlin Priestly - -   No wound care   Complete by: As directed        Hospital Course:  For full details, please see H&P, progress notes, consult notes and ancillary notes.  Briefly,  Cheryl Palermo [name] a 75 year old female with past medical history significant for peripheral artery disease on Xarelto followed by vascular surgery outpatient, recent right fourth toe injury followed by podiatry, essential hypertension, type 2 diabetes mellitus, CKD stage IIIb, chronic normocytic anemia who was directed to [facility] ED by her podiatrist, Dr. Excell Seltzer on 5/2 for concern of early gangrene affecting her right fourth toe.   Patient reports injury to that toe several weeks ago and was on oral antibiotics with Keflex over the past 7 days with some improvement initially; although she noted day of admission that her toe was dark in appearance. Patient denies any pain, but underlying significant peripheral neuropathy.MRI positive for fourth toe osteomyelitis.  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 --started on ceftriaxone.  Surgical shoe provided after 4th toe amputation.  Pt was discharged on 10 days of Cipro and Augmentin per podiatry rec.  Pt will follow up with Dr. Excell Seltzer on Wed  5/11.  2.  Peripheral arterial disease -Patient with history of prior bilateral femoral endarterectomies March 2020, stent to the left SFA September 2020, stent to right popliteal/SFA October 2020. -Patient being followed closely with vascular surgery, Dr Wyn Quaker. -Was on aspirin and Xarelto at home and continued after surgery. --Pt received angiogram on 5/5 with no intervention required -Continue statin.  3.  Hypertension  -Noted to be on metoprolol, olmesartan, amlodipine prior to admission. -Home olmesartan held initially due to hyperkalemia, and resumed after discharge.   --cont amlodipine and Toprol  4.  Type 2 diabetes mellitus with neuropathy -Hemoglobin A1c 6.3 (05/04/2020) -Hold home metformin while inpatient, resumed after discharge.  Pt received Lantus 15u daily, mealtime 3u TID, and SSI while inpatient.  Discharged back on home insulin regimen. -Continue Neurontin.  5.  Chronic kidney disease stage IIIb -Stable at baseline.  6.  Chronic normocytic anemia, likely due to chronic disease Hgb 7-8's.    7.  Hypothyroidism -Synthroid.  8.  Hyperlipidemia -Continue statin.  9.  Depression/anxiety -Continue Cymbalta and as needed Xanax.  10.  Gastroesophageal reflux disease -Continue Protonix, Pepcid.   Discharge Diagnoses:  Principal Problem:   Gangrene of toe of right foot (HCC) Active Problems:   Iron deficiency anemia   Diabetes mellitus type 2, uncontrolled (HCC)   Hyperlipidemia, mixed   Benign essential hypertension   Peripheral vascular disease, unspecified (HCC)   Acquired hypothyroidism   DM type 2 with diabetic peripheral neuropathy (HCC)  Stage 3b chronic kidney disease (HCC)   30 Day Unplanned Readmission Risk Score   Flowsheet Row ED to Hosp-Admission (Current) from 05/04/2020 in [facility] (1A)  30 Day Unplanned Readmission Risk Score (%) 19.06 Filed at 05/09/2020 1200     This score is the patient's risk  of an unplanned readmission within 30 days of being discharged (0 -100%). The score is based on dignosis, age, lab data, medications, orders, and past utilization.   Low:  0-14.9   Medium: 15-21.9   High: 22-29.9   Extreme: 30 and above        Discharge Instructions:  Allergies as of 05/09/2020      Reactions   Ace Inhibitors Other (See Comments)      Medication List    STOP taking these medications   cephALEXin 500 MG capsule Commonly known as: KEFLEX   mupirocin cream 2 % Commonly known as: BACTROBAN   nitrofurantoin 100 MG capsule Commonly known as: MACRODANTIN   nystatin cream Commonly known as: MYCOSTATIN     TAKE these medications   ALPRAZolam 0.25 MG tablet Commonly known as: XANAX Take 0.25 mg by mouth daily as needed for anxiety or sleep.   amLODipine 5 MG tablet Commonly known as: NORVASC Take 5 mg by mouth daily.   amoxicillin-clavulanate 875-125 MG tablet Commonly known as: Augmentin Take 1 tablet by mouth 2 (two) times daily for 10 days. Antibiotic.   aspirin EC 81 MG tablet Take 81 mg by mouth daily.   cholecalciferol 1000 units tablet Commonly known as: VITAMIN D Take 1,000 Units by mouth 2 (two) times daily.   ciprofloxacin 500 MG tablet Commonly known as: Cipro Take 1 tablet (500 mg total) by mouth 2 (two) times daily for 10 days. Antibiotic.   CORAL CALCIUM PO Take 1 tablet by mouth 2 (two) times daily.   denosumab 60 MG/ML Soln injection Commonly known as: PROLIA Inject 60 mg into the skin every 6 (six) months.   DULoxetine 30 MG capsule Commonly known as: CYMBALTA Take 30 mg by mouth daily.   estradiol 0.1 MG/GM vaginal cream Commonly known as: ESTRACE Apply one pea-sized amount around the opening of the urethra daily for two weeks, then three times weekly thereafter.   famotidine 40 MG tablet Commonly known as: PEPCID Take 40 mg by mouth daily.   furosemide 20 MG tablet Commonly known as: LASIX Take 20 mg by mouth daily as  needed for fluid or edema.   gabapentin 100 MG capsule Commonly known as: NEURONTIN Take 1 capsule by mouth 2 (two) times daily.   Insulin Pen Needle 31G X 5 MM Misc once daily.   levothyroxine 100 MCG tablet Commonly known as: SYNTHROID Take 100 mcg by mouth daily before breakfast.   lidocaine-prilocaine cream Commonly known as: EMLA Apply 1 application topically as needed (apply prior to port a cath access).   Magnesium 500 MG Tabs Take 500 mg by mouth every morning.   metFORMIN 1000 MG tablet Commonly known as: GLUCOPHAGE Take 1 tablet by mouth 2 (two) times daily with a meal.   metoprolol succinate 50 MG 24 hr tablet Commonly known as: TOPROL-XL Take 50 mg by mouth daily.   mirabegron ER 50 MG Tb24 tablet Commonly known as: MYRBETRIQ Take 1 tablet (50 mg total) by mouth daily.   mirtazapine 15 MG tablet Commonly known as: REMERON Take 15 mg by mouth at bedtime.   multivitamin with minerals Tabs tablet Take 1 tablet by mouth  daily.   olmesartan 20 MG tablet Commonly known as: BENICAR Take 20 mg by mouth daily.   pantoprazole 40 MG tablet Commonly known as: PROTONIX Take 40 mg by mouth every morning.   rivaroxaban 20 MG Tabs tablet Commonly known as: XARELTO Take 1 tablet (20 mg total) by mouth daily with supper.   rosuvastatin 20 MG tablet Commonly known as: CRESTOR Take 20 mg by mouth every morning.   Evaristo Buryresiba FlexTouch 200 UNIT/ML FlexTouch Pen Generic drug: insulin degludec Inject 30 Units as directed at bedtime.   vitamin B-12 100 MCG tablet Commonly known as: CYANOCOBALAMIN Take 100 mcg by mouth daily.   vitamin E 180 MG (400 UNITS) capsule Take 400 Units by mouth daily.   zolpidem 5 MG tablet Commonly known as: AMBIEN Take 1 tablet (5 mg total) by mouth at bedtime as needed for sleep. What changed:   when to take this  reasons to take this        Follow-up Information    Danella PentonMiller, Mark F, MD. Schedule an appointment as soon as  possible for a visit in 1 week(s).   Specialty: Internal Medicine Contact information: [address] [place] [id number] [phone]               Allergies  Allergen Reactions  . Ace Inhibitors Other (See Comments)     The results of significant diagnostics from this hospitalization (including imaging, microbiology, ancillary and laboratory) are listed below for reference.   Consultations:   Procedures/Studies: MR FOOT RIGHT WO CONTRAST  Result Date: 05/05/2020 CLINICAL DATA:  Foot swelling and diabetes. Wound infection along the right fourth toe EXAM: MRI OF THE RIGHT FOREFOOT WITHOUT CONTRAST TECHNIQUE: Multiplanar, multisequence MR imaging of the right forefoot was performed. No intravenous contrast was administered. COMPARISON:  Foot radiographs of 05/04/2020 FINDINGS: Bones/Joint/Cartilage Abnormal high T2 and low T1 signal in the proximal, middle, and distal phalanges of the fourth toe, highly suspicious in this clinical context for active osteomyelitis. Subtle endosteal edema along the medial first digit sesamoid is probably attributable to the severe degree of degenerative arthropathy between the first metatarsal head and the sesamoids. Notable erosions of the first metatarsal head especially medially, raising suspicion for gout arthropathy. Erosion or degenerative subcortical cyst along the medial base of the lateral cuneiform, image 5 series 10. Dorsal talonavicular joint effusion with mild spurring. Small anterior tibiotalar joint effusion. Deformity along the base of the proximal phalanx of the small toe, posttraumatic versus due to chronic arthropathy. Ligaments Lisfranc ligament intact Muscles and Tendons Low-level accentuated T2 signal diffusely through the regional plantar musculature, probably neurogenic. Soft tissues Dorsal subcutaneous edema in the foot extending into the toes. Edema is most striking in the fourth toe and probably represent cellulitis.  IMPRESSION: 1. Abnormal osseous edema in the phalanges of the fourth toe highly suspicious in this clinical context for acute osteomyelitis. Surrounding cellulitis in the fourth toe. 2. Notable erosions in the head of the first metatarsal, probably from gout. 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 3. Degenerative subcortical cyst or erosion along the medial base of the lateral cuneiform. 4. Small effusions of the talonavicular and tibiotalar articulations seen on the sagittal images. 5. Potential low-grade sesamoiditis of the medial sesamoid of the first digit. However this may be due to the underlying arthropathy. 6. Chronic appearing deformity of the base of the proximal phalanx small toe, likely posttraumatic. Electronically Signed   By: Annitta NeedsWalter  Liebkemann M.D.  On: 05/05/2020 17:33   PERIPHERAL VASCULAR CATHETERIZATION  Result Date: 05/07/2020 See op note  DG Foot Complete Right  Result Date: 05/08/2020 CLINICAL DATA:  Patient status post amputation of the right fourth toe today. EXAM: RIGHT FOOT COMPLETE - 3+ VIEW COMPARISON:  Plain films right foot 05/04/2020. FINDINGS: The right fourth toe has been amputated. No acute bony or joint abnormality is seen. 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 No unexpected radiopaque foreign body. IMPRESSION: Status post amputation of the fourth toe.  No acute abnormality. Hallux valgus and first MTP osteoarthritis. Electronically Signed   By: Drusilla Kanner M.D.   On: 05/08/2020 11:23   DG Foot Complete Right  Result Date: 05/04/2020 CLINICAL DATA:  Infection fourth toe RIGHT foot EXAM: RIGHT FOOT COMPLETE - 3+ VIEW COMPARISON:  None FINDINGS: Soft tissue swelling of the toes greatest at fourth toe. Osseous demineralization. Degenerative changes at first MTP joint with hallux valgus and bunion deformity. No acute fracture or dislocation. Deformity at base of proximal phalanx fifth toe likely  sequela of remote trauma. IMPRESSION: No definite acute osseous abnormalities. Hallux valgus with bunion deformity and associated degenerative changes first MTP joint. Probable posttraumatic deformity at base of proximal phalanx fifth toe. Electronically Signed   By: Ulyses Southward M.D.   On: 05/04/2020 16:49      Labs: BNP (last 3 results) No results for input(s): BNP in the last 8760 hours. Basic Metabolic Panel: Recent Labs  Lab 05/05/20 0956 05/06/20 1000 05/07/20 0745 05/08/20 0437 05/09/20 0750  NA 134* 135 141 138 137  K 4.9 4.3 4.2 3.8 4.0  CL 103 105 109 107 107  CO2 GLUCOSE 376* 349* 156* 184* 148*  BUN 27* CREATININE 1.27* 1.15* 1.11* 0.98 0.94  CALCIUM 9.2 8.9 9.1 8.7* 8.6*  MG 1.8  --   --  2.0 2.2  PHOS 3.7  --   --   --   --    Liver Function Tests: No results for input(s): AST, ALT, ALKPHOS, BILITOT, PROT, ALBUMIN in the last 168 hours. No results for input(s): LIPASE, AMYLASE in the last 168 hours. No results for input(s): AMMONIA in the last 168 hours. CBC: Recent Labs  Lab 05/04/20 1553 05/05/20 0956 05/06/20 1000 05/07/20 0745 05/08/20 0437 05/09/20 0750  WBC 6.8 5.0 5.2 5.4 6.8 5.9  NEUTROABS 4.4  --   --   --  5.2  --   HGB 8.2* 7.4* 7.8* 8.2* 8.1* 8.0*  HCT 26.0* 23.1* 24.4* 25.6* 25.4* 25.4*  MCV 89.0 86.8 87.5 87.4 88.5 88.8  PLT 326 257 285 314 346 308   Cardiac Enzymes: No results for input(s): CKTOTAL, CKMB, CKMBINDEX, TROPONINI in the last 168 hours. BNP: Invalid input(s): POCBNP CBG: Recent Labs  Lab 05/08/20 1130 05/08/20 1608 05/08/20 2228 05/09/20 0819 05/09/20 1134  GLUCAP 263* 206* 111* 155* 304*   D-Dimer No results for input(s): DDIMER in the last 72 hours. Hgb A1c No results for input(s): HGBA1C in the last 72 hours. Lipid Profile No results for input(s): CHOL, HDL, LDLCALC, TRIG, CHOLHDL, LDLDIRECT in the last 72 hours. Thyroid function studies No results for input(s): TSH, T4TOTAL,  T3FREE, THYROIDAB in the last 72 hours.  Invalid input(s): FREET3 Anemia work up No results for input(s): VITAMINB12, FOLATE, FERRITIN, TIBC, IRON, RETICCTPCT in the last 72 hours. Urinalysis    Component Value Date/Time   APPEARANCEUR Hazy (A) 04/27/2020 0981   GLUCOSEU  Negative 04/27/2020 0924   BILIRUBINUR Negative 04/27/2020 0924   PROTEINUR Negative 04/27/2020 0924   NITRITE Negative 04/27/2020 0924   LEUKOCYTESUR Negative 04/27/2020 0924   Sepsis Labs Invalid input(s): PROCALCITONIN,  WBC,  LACTICIDVEN Microbiology Recent Results (from the past 240 hour(s))  SARS CORONAVIRUS 2 (TAT 6-24 HRS) Nasopharyngeal Nasopharyngeal Swab     Status: None   Collection Time: 05/04/20  5:59 PM   Specimen: Nasopharyngeal Swab  Result Value Ref Range Status   SARS Coronavirus 2 NEGATIVE NEGATIVE Final    Comment: (NOTE) SARS-CoV-2 target nucleic acids are NOT DETECTED.  The SARS-CoV-2 RNA is generally detectable in upper and lower respiratory specimens during the acute phase of infection. Negative results do not preclude SARS-CoV-2 infection, do not rule out co-infections with other pathogens, and should not be used as the sole basis for treatment or other patient management decisions. Negative results must be combined with clinical observations, patient history, and epidemiological information. The expected result is Negative.  Fact Sheet for Patients: HairSlick.no  Fact Sheet for Healthcare Providers: quierodirigir.com  This test is not yet approved or cleared by the [place] FDA and  has been authorized for detection and/or diagnosis of SARS-CoV-2 by FDA under an Emergency Use Authorization (EUA). This EUA will remain  in effect (meaning this test can be used) for the duration of the COVID-19 declaration under Se ction 564(b)(1) of the Act, 21 U.S.C. section 360bbb-3(b)(1), unless the authorization is terminated  or revoked sooner.  Performed at [facility], [address], [place], [place] [address]   CULTURE, BLOOD (ROUTINE X 2) w Reflex to ID Panel     Status: None   Collection Time: 05/04/20  9:06 PM   Specimen: BLOOD  Result Value Ref Range Status   Specimen Description BLOOD BLOOD LEFT HAND  Final   Special Requests   Final    BOTTLES DRAWN AEROBIC AND ANAEROBIC Blood Culture adequate volume   Culture   Final    NO GROWTH 5 DAYS Performed at [facility], [address] [place], [place] [address]    Report Status 05/09/2020 FINAL  Final  CULTURE, BLOOD (ROUTINE X 2) w Reflex to ID Panel     Status: None   Collection Time: 05/04/20  9:06 PM   Specimen: BLOOD  Result Value Ref Range Status   Specimen Description BLOOD BLOOD LEFT FOREARM  Final   Special Requests   Final    BOTTLES DRAWN AEROBIC AND ANAEROBIC Blood Culture adequate volume   Culture   Final    NO GROWTH 5 DAYS Performed at [facility], [address], [place], [place] [address]    Report Status 05/09/2020 FINAL  Final  Surgical PCR screen     Status: None   Collection Time: 05/07/20 10:21 PM   Specimen: Nasal Mucosa; Nasal Swab  Result Value Ref Range Status   MRSA, PCR NEGATIVE NEGATIVE Final   Staphylococcus aureus NEGATIVE NEGATIVE Final    Comment: (NOTE) The Xpert SA Assay (FDA approved for NASAL specimens in patients 64 years of age and older), is one component of a comprehensive surveillance program. It is not intended to diagnose infection nor to guide or monitor treatment. Performed at [facility], [address] [place], [place] [address]   Aerobic/Anaerobic Culture w Gram Stain (surgical/deep wound)     Status: None (Preliminary result)   Collection Time: 05/08/20  7:39 AM   Specimen: PATH Other; Tissue  Result Value Ref  Range Status   Specimen Description   Final    TOE Performed at [facility], [address] [place], [place] [address]     Special Requests   Final    NONE Performed at [facility], [address] [place], [place] [address]    Gram Stain NO WBC SEEN NO ORGANISMS SEEN   Final   Culture   Final    CULTURE REINCUBATED FOR BETTER GROWTH Performed at [facility], [address] [place], [place] [address]    Report Status PENDING  Incomplete     Total time spend on discharging this patient, including the last patient exam, discussing the hospital stay, instructions for ongoing care as it relates to all pertinent caregivers, as well as preparing the medical discharge records, prescriptions, and/or referrals as applicable, is 35 minutes.    Darlin Priestly, MD  [facility] 05/09/2020, 12:04 PM

## 2020-05-09 NOTE — Evaluation (Signed)
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  Endorses minimal pain to R LE; post-op dressing/ace wrap clean, dry and intact.  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  Able to complete bed mobility with indep; sit/stand, basic transfers and gait (75') with RW, sup/mod indep; stairs (up/down 4 with bilat rails), close sup.  Gait pattern significant for 3-point, step to gait pattern to optimize PWB/heel WBing R LE with gait efforts; slightly impulsive, but redirectable.  Encouraged for shoe to L foot with gait to maximize balance (and L foot skin protection).  Patient/husband voiced understanding of all information. Educated in use of RW, donning/doffing R post-op shoe, WBing and general activity restrictions, car transfers and overall safety needs; patient/husband voiced understanding and awareness.  Patient/husband comfortable with upcoming discharge and level of care required; no additional questions/concerns at this time. Okay for discharge home with husband as medically appropriate; no additional PT needs at discharge.  May consider follow up PT services as WBing status advanced.  Patient/husband aware.    Follow Up Recommendations No PT follow up    Equipment Recommendations   (has all equipment needs)    Recommendations for Other Services       Precautions / Restrictions Precautions Precautions: Fall Restrictions Weight Bearing Restrictions: Yes RLE Weight Bearing: Partial weight bearing RLE Partial Weight Bearing  Percentage or Pounds: Heel WBing R LE with post-op shoe      Mobility  Bed Mobility Overal bed mobility: Independent                  Transfers Overall transfer level: Needs assistance Equipment used: Rolling walker (2 wheeled) Transfers: Sit to/from Stand Sit to Stand: Supervision            Ambulation/Gait Ambulation/Gait assistance: Supervision Gait Distance (Feet): 75 Feet Assistive device: Rolling walker (2 wheeled)       General Gait Details: educated in 3-point, step to gait pattern to optimize PWB/heel WBing R LE with gait efforts; slightly impulsive, but redirectable.  Encouraged for shoe to L foot with gait to maximize balance (and L foot skin protection).  Patient/husband voiced understanding of all information.  Stairs Stairs: Yes Stairs assistance: Supervision Stair Management: Two rails Number of Stairs: 4 General stair comments: step to gait pattern, min cuing for technique; good return demonstration of stair negotiation  Wheelchair Mobility    Modified Rankin (Stroke Patients Only)       Balance Overall balance assessment: Needs assistance Sitting-balance support: No upper extremity supported;Feet supported Sitting balance-Leahy Scale: Good     Standing balance support: Bilateral upper extremity supported Standing balance-Leahy Scale: Fair                               Pertinent Vitals/Pain Pain Assessment: No/denies pain    Home Living Family/patient expects to be discharged to:: Private residence Living Arrangements: Spouse/significant other Available Help at Discharge: Family;Available 24 hours/day Type of Home: House Home Access: Stairs to enter Entrance Stairs-Rails: Right;Left;Can reach both Entrance Stairs-Number of Steps: 4 Home  Layout: Able to live on main level with bedroom/bathroom Home Equipment: Walker - 2 wheels      Prior Function Level of Independence: Independent with assistive device(s)          Comments: Indep with ADLs, household and community mobilization without assist device     Hand Dominance   Dominant Hand: Right    Extremity/Trunk Assessment   Upper Extremity Assessment Upper Extremity Assessment: Overall WFL for tasks assessed    Lower Extremity Assessment Lower Extremity Assessment: Overall WFL for tasks assessed (baseline neuropathy bilat LEs; R foot with surgical dressing/ace wrap in place)       Communication   Communication: No difficulties  Cognition Arousal/Alertness: Awake/alert Behavior During Therapy: WFL for tasks assessed/performed Overall Cognitive Status: Within Functional Limits for tasks assessed                                        General Comments      Exercises Other Exercises Other Exercises: Educated in use of RW, donning/doffing R post-op shoe, WBing and general activity restrictions, car transfers and overall safety needs; patient/husband voiced understanding and awareness.   Assessment/Plan    PT Assessment Patent does not need any further PT services  PT Problem List Decreased strength;Decreased range of motion;Decreased activity tolerance;Decreased balance;Decreased mobility;Decreased coordination;Decreased cognition;Decreased knowledge of use of DME;Decreased safety awareness;Decreased knowledge of precautions;Cardiopulmonary status limiting activity;Decreased skin integrity       PT Treatment Interventions DME instruction;Gait training;Stair training;Functional mobility training;Therapeutic activities;Therapeutic exercise;Balance training;Patient/family education    PT Goals (Current goals can be found in the Care Plan section)  Acute Rehab PT Goals Patient Stated Goal: to go home! PT Goal Formulation: All assessment and education complete, DC therapy Time For Goal Achievement: 05/09/20 Potential to Achieve Goals: Good    Frequency     Barriers to discharge        Co-evaluation                AM-PAC PT "6 Clicks" Mobility  Outcome Measure Help needed turning from your back to your side while in a flat bed without using bedrails?: None Help needed moving from lying on your back to sitting on the side of a flat bed without using bedrails?: None Help needed moving to and from a bed to a chair (including a wheelchair)?: None Help needed standing up from a chair using your arms (e.g., wheelchair or bedside chair)?: None Help needed to walk in hospital room?: None Help needed climbing 3-5 steps with a railing? : A Little 6 Click Score: 23    End of Session Equipment Utilized During Treatment: Gait belt Activity Tolerance: Patient tolerated treatment well Patient left: in bed;with call bell/phone within reach;with bed alarm set Nurse Communication: Mobility status PT Visit Diagnosis: Muscle weakness (generalized) (M62.81);Difficulty in walking, not elsewhere classified (R26.2)    Time: 7209-4709 PT Time Calculation (min) (ACUTE ONLY): 26 min   Charges:   PT Evaluation $PT Eval Moderate Complexity: 1 Mod PT Treatments $Therapeutic Activity: 8-22 mins        Skylee Baird H. Manson Passey, PT, DPT, NCS 05/09/20, 12:58 PM [phone]

## 2020-05-09 NOTE — Progress Notes (Signed)
Patient ID: Cheryl Leonard, female   DOB: 1945-05-28, 75 y.o.   MRN: [id number]   Patient with left foot dressing intact.  Ready to go home.  She will be discharge today.  Toe amputation site seen on husbands phone and looks healthy.  She has no other issues and will follow-up with Vascular as planned.

## 2020-05-09 NOTE — Anesthesia Postprocedure Evaluation (Signed)
Anesthesia Post Note  Patient: Cheryl Leonard  Procedure(s) Performed: AMPUTATION TOE-Right 4th Toe (Right Toe)  Patient location during evaluation: PACU Anesthesia Type: General Level of consciousness: awake and alert and oriented Pain management: pain level controlled Vital Signs Assessment: post-procedure vital signs reviewed and stable Respiratory status: spontaneous breathing Cardiovascular status: blood pressure returned to baseline Anesthetic complications: no   No complications documented.   Last Vitals:  Vitals:   05/09/20 0626 05/09/20 0812  BP: (!) 149/70 (!) 154/75  Pulse: 73 71  Resp: 17 14  Temp: 37.3 C 37.2 C  SpO2: 98% 100%    Last Pain:  Vitals:   05/09/20 0812  TempSrc: Oral  PainSc:                  Kynnadi Dicenso

## 2020-05-09 NOTE — Progress Notes (Signed)
1 Day Post-Op   Subjective/Chief Complaint: Patient seen.  No complaints of any pain with her foot.   Objective: Vital signs in last 24 hours: Temp:  [98.6 F (37 C)-99.2 F (37.3 C)] 98.9 F (37.2 C) (05/07 0812) Pulse Rate:  [71-81] 71 (05/07 0812) Resp:  [14-17] 14 (05/07 0812) BP: (139-154)/(55-75) 154/75 (05/07 0812) SpO2:  [98 %-100 %] 100 % (05/07 0812) Last BM Date: 05/08/20  Intake/Output from previous day: 05/06 0701 - 05/07 0700 In: -  Out: 2 [Blood:2] Intake/Output this shift: Total I/O In: 340 [P.O.:240; IV Piggyback:100] Out: -   Bandage on the right foot is dry and intact.  Upon removal there is some mild to moderate bleeding.  No significant purulence noted.  Incision well coapted and skin edges all appear viable at this point.    Lab Results:  Recent Labs    05/08/20 0437 05/09/20 0750  WBC 6.8 5.9  HGB 8.1* 8.0*  HCT 25.4* 25.4*  PLT 346 308   BMET Recent Labs    05/08/20 0437 05/09/20 0750  NA 138 137  K 3.8 4.0  CL 107 107  CO2 24 24  GLUCOSE 184* 148*  BUN 22 17  CREATININE 0.98 0.94  CALCIUM 8.7* 8.6*   PT/INR No results for input(s): LABPROT, INR in the last 72 hours. ABG No results for input(s): PHART, HCO3 in the last 72 hours.  Invalid input(s): PCO2, PO2  Studies/Results: PERIPHERAL VASCULAR CATHETERIZATION  Result Date: 05/07/2020 See op note  DG Foot Complete Right  Result Date: 05/08/2020 CLINICAL DATA:  Patient status post amputation of the right fourth toe today. EXAM: RIGHT FOOT COMPLETE - 3+ VIEW COMPARISON:  Plain films right foot 05/04/2020. FINDINGS: The right fourth toe has been amputated. No acute bony or joint abnormality is seen. 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 No unexpected radiopaque foreign body. IMPRESSION: Status post amputation of the fourth toe.  No acute abnormality. Hallux valgus and first MTP osteoarthritis. Electronically Signed   By: Drusilla Kanner M.D.   On: 05/08/2020  11:23    Anti-infectives: Anti-infectives (From admission, onward)   Start     Dose/Rate Route Frequency Ordered Stop   05/07/20 1400  ceFAZolin (ANCEF) IVPB 1 g/50 mL premix       Note to Pharmacy: To be given in specials   1 g 100 mL/hr over 30 Minutes Intravenous  Once 05/07/20 1352 05/07/20 1748   05/07/20 1321  sodium chloride 0.9 % with ceFAZolin (ANCEF) ADS Med       Note to Pharmacy: Tillman Sers   : cabinet override      05/07/20 1321 05/08/20 0129   05/04/20 2100  cefTRIAXone (ROCEPHIN) 2 g in sodium chloride 0.9 % 100 mL IVPB        2 g 200 mL/hr over 30 Minutes Intravenous Every 24 hours 05/04/20 2005        Assessment/Plan: s/p Procedure(s): AMPUTATION TOE-Right 4th Toe (Right) Assessment: Stable status post amputation right fourth toe.   Plan: Betadine and a sterile bandage reapplied.  Discussed with the patient to keep this bandage clean dry and do not remove.  States she has an appointment already scheduled with Dr. Excell Seltzer for Wednesday.  Instructed the patient on how to walk flat-footed with pressure only on the heel and her surgical shoe.  Would recommend discharge on oral antibiotics.  Recommended Augmentin and Cipro.  Patient will follow-up with Dr. Excell Seltzer as scheduled on Wednesday  LOS: 5 days  Ricci Barker 05/09/2020

## 2020-05-10 ENCOUNTER — Encounter: Payer: Self-pay | Admitting: Podiatry

## 2020-05-11 LAB — SURGICAL PATHOLOGY

## 2020-05-12 ENCOUNTER — Ambulatory Visit (INDEPENDENT_AMBULATORY_CARE_PROVIDER_SITE_OTHER): Payer: Medicare Other | Admitting: Vascular Surgery

## 2020-05-12 ENCOUNTER — Encounter (INDEPENDENT_AMBULATORY_CARE_PROVIDER_SITE_OTHER): Payer: Medicare Other

## 2020-05-13 DIAGNOSIS — S98131A Complete traumatic amputation of one right lesser toe, initial encounter: Secondary | ICD-10-CM | POA: Insufficient documentation

## 2020-05-13 LAB — AEROBIC/ANAEROBIC CULTURE W GRAM STAIN (SURGICAL/DEEP WOUND): Gram Stain: NONE SEEN

## 2020-05-14 ENCOUNTER — Other Ambulatory Visit: Payer: Self-pay

## 2020-05-14 ENCOUNTER — Ambulatory Visit (INDEPENDENT_AMBULATORY_CARE_PROVIDER_SITE_OTHER): Payer: Medicare Other | Admitting: Ophthalmology

## 2020-05-14 ENCOUNTER — Encounter (INDEPENDENT_AMBULATORY_CARE_PROVIDER_SITE_OTHER): Payer: Self-pay | Admitting: Ophthalmology

## 2020-05-14 DIAGNOSIS — H35371 Puckering of macula, right eye: Secondary | ICD-10-CM

## 2020-05-14 DIAGNOSIS — I10 Essential (primary) hypertension: Secondary | ICD-10-CM

## 2020-05-14 DIAGNOSIS — H34831 Tributary (branch) retinal vein occlusion, right eye, with macular edema: Secondary | ICD-10-CM

## 2020-05-14 DIAGNOSIS — E113213 Type 2 diabetes mellitus with mild nonproliferative diabetic retinopathy with macular edema, bilateral: Secondary | ICD-10-CM

## 2020-05-14 DIAGNOSIS — H3581 Retinal edema: Secondary | ICD-10-CM | POA: Diagnosis not present

## 2020-05-14 DIAGNOSIS — Z961 Presence of intraocular lens: Secondary | ICD-10-CM

## 2020-05-14 DIAGNOSIS — H35033 Hypertensive retinopathy, bilateral: Secondary | ICD-10-CM

## 2020-05-14 MED ORDER — AFLIBERCEPT 2MG/0.05ML IZ SOLN FOR KALEIDOSCOPE
2.0000 mg | INTRAVITREAL | Status: AC | PRN
  Administered 2020-05-14: 2 mg via INTRAVITREAL

## 2020-05-14 NOTE — Progress Notes (Addendum)
[facility] - Clinic Note  05/14/2020     CHIEF COMPLAINT Patient presents for Retina Follow Up   HISTORY OF PRESENT ILLNESS: Cheryl Leonard is a 74 y.o. female who presents to the clinic today for:   HPI    Retina Follow Up    Patient presents with  CRVO/BRVO.  In right eye.  This started 6 weeks ago.  Since onset it is stable.  I, the attending physician,  performed the HPI with the patient and updated documentation appropriately.          Comments    Pt here for 6wk retinal follow up BRVO OD. Pt states her vision is about the same. Blood sugar was 142 this am. No ocular pain or discomfort OU.        Last edited by [name], MD on 05/14/2020  4:27 PM. (History)    Patient had her 4th toe amputated on her right foot a couple weeks ago, pt states she is doing okay since then  Referring physician: Danella Penton, MD [address] [facility] [place],  [place] [address]  HISTORICAL INFORMATION:   Selected notes from the MEDICAL RECORD NUMBER Referred by Dr. Senaida Ores for concern of DME OD Lab Results  Component Value Date   HGBA1C 6.3 (H) 05/04/2020       CURRENT MEDICATIONS: No current outpatient medications on file. (Ophthalmic Drugs)   No current facility-administered medications for this visit. (Ophthalmic Drugs)   Current Outpatient Medications (Other)  Medication Sig  . ALPRAZolam (XANAX) 0.25 MG tablet Take 0.25 mg by mouth daily as needed for anxiety or sleep.   [name] amLODipine (NORVASC) 5 MG tablet Take 5 mg by mouth daily.  [name] amoxicillin-clavulanate (AUGMENTIN) 875-125 MG tablet Take 1 tablet by mouth 2 (two) times daily for 10 days. Antibiotic.  [name] aspirin EC 81 MG tablet Take 81 mg by mouth daily.   . cholecalciferol (VITAMIN D) 1000 units tablet Take 1,000 Units by mouth 2 (two) times daily.  . ciprofloxacin (CIPRO) 500 MG tablet Take 1 tablet (500 mg total) by mouth 2 (two) times daily for 10 days.  Antibiotic.  [name] CORAL CALCIUM PO Take 1 tablet by mouth 2 (two) times daily.   [name] denosumab (PROLIA) 60 MG/ML SOLN injection Inject 60 mg into the skin every 6 (six) months.   . DULoxetine (CYMBALTA) 30 MG capsule Take 30 mg by mouth daily.  [name] estradiol (ESTRACE) 0.1 MG/GM vaginal cream Apply one pea-sized amount around the opening of the urethra daily for two weeks, then three times weekly thereafter.  . famotidine (PEPCID) 40 MG tablet Take 40 mg by mouth daily.  . furosemide (LASIX) 20 MG tablet Take 20 mg by mouth daily as needed for fluid or edema.   . gabapentin (NEURONTIN) 100 MG capsule Take 1 capsule by mouth 2 (two) times daily.  . Insulin Pen Needle 31G X 5 MM MISC once daily.  [name] levothyroxine (SYNTHROID, LEVOTHROID) 100 MCG tablet Take 100 mcg by mouth daily before breakfast.   . lidocaine-prilocaine (EMLA) cream Apply 1 application topically as needed (apply prior to port a cath access).  . Magnesium 500 MG TABS Take 500 mg by mouth every morning.   . metFORMIN (GLUCOPHAGE) 1000 MG tablet Take 1 tablet by mouth 2 (two) times daily with a meal.  . metoprolol succinate (TOPROL-XL) 50 MG 24 hr tablet Take 50 mg by mouth daily.  . mirabegron ER (MYRBETRIQ) 50  MG TB24 tablet Take 1 tablet (50 mg total) by mouth daily.  . mirtazapine (REMERON) 15 MG tablet Take 15 mg by mouth at bedtime.  . Multiple Vitamin (MULTIVITAMIN WITH MINERALS) TABS tablet Take 1 tablet by mouth daily.  [name] olmesartan (BENICAR) 20 MG tablet Take 20 mg by mouth daily.  . pantoprazole (PROTONIX) 40 MG tablet Take 40 mg by mouth every morning.   . rivaroxaban (XARELTO) 20 MG TABS tablet Take 1 tablet (20 mg total) by mouth daily with supper.  . rosuvastatin (CRESTOR) 20 MG tablet Take 20 mg by mouth every morning.   . TRESIBA FLEXTOUCH 200 UNIT/ML SOPN Inject 30 Units as directed at bedtime.   . vitamin B-12 (CYANOCOBALAMIN) 100 MCG tablet Take 100 mcg by mouth daily.  . vitamin E 400 UNIT capsule Take 400 Units by  mouth daily.  [name] zolpidem (AMBIEN) 5 MG tablet Take 1 tablet (5 mg total) by mouth at bedtime as needed for sleep.   No current facility-administered medications for this visit. (Other)      REVIEW OF SYSTEMS: ROS    Positive for: Neurological, Genitourinary, Musculoskeletal, Endocrine, Eyes   Negative for: Constitutional, Gastrointestinal, Skin, HENT, Cardiovascular, Respiratory, Psychiatric, Allergic/Imm, Heme/Lymph   Last edited by Thompson Grayer, COT on 05/14/2020  2:35 PM. (History)       ALLERGIES Allergies  Allergen Reactions  . Ace Inhibitors Other (See Comments)    PAST MEDICAL HISTORY Past Medical History:  Diagnosis Date  . Anemia   . Anxiety   . Arthritis    Gout  . Cataracts, both eyes   . Diabetic retinopathy (HCC)    NPDR OU  . Diabetic retinopathy (HCC)   . GERD (gastroesophageal reflux disease)   . Gout   . Headache    h/o migraines  . History of fracture of patella    right knee  . History of positive PPD    Patient always shows positive  . Hyperlipidemia   . Hypertension   . Hypertensive retinopathy    OU  . Hypothyroidism   . Lichen sclerosus 12/30/2013   of vulva  . Metatarsal fracture   . Neuropathy   . Peripheral vascular disease (HCC)   . Polyneuropathy    numbness and tingling in feet and toes  . Renal insufficiency    Stage 3  . Sleep apnea    does not use cpap-lost weight   . Type 2 diabetes mellitus, uncontrolled (HCC)    Past Surgical History:  Procedure Laterality Date  . ABDOMINAL HYSTERECTOMY    . AMPUTATION TOE Right 05/08/2020   Procedure: AMPUTATION TOE-Right 4th Toe;  Surgeon: Rosetta Posner, DPM;  Location: [facility];  Service: Podiatry;  Laterality: Right;  . APPENDECTOMY    . BREAST REDUCTION SURGERY  2001  . CATARACT EXTRACTION    . CESAREAN SECTION  1976  . COLONOSCOPY  03/05/2013   Nml - due for repeat 03/06/2018  . COLONOSCOPY WITH PROPOFOL N/A 03/18/2019   Procedure: COLONOSCOPY WITH PROPOFOL;  Surgeon:  Toledo, [name], MD;  Location: [facility];  Service: Gastroenterology;  Laterality: N/A;  . DIAGNOSTIC LAPAROSCOPY    . DILATION AND CURETTAGE OF UTERUS  1989  . ENDARTERECTOMY FEMORAL Bilateral 03/09/2018   Procedure: ENDARTERECTOMY FEMORAL;  Surgeon: Annice Needy, MD;  Location: [facility];  Service: Vascular;  Laterality: Bilateral;  . ENDARTERECTOMY POPLITEAL Left 03/09/2018   Procedure: ENDARTERECTOMY POPLITEAL AND SFA;  Surgeon: Annice Needy, MD;  Location: [facility];  Service: Vascular;  Laterality: Left;  . ESOPHAGOGASTRODUODENOSCOPY  03/05/2013  . ESOPHAGOGASTRODUODENOSCOPY (EGD) WITH PROPOFOL N/A 03/18/2019   Procedure: ESOPHAGOGASTRODUODENOSCOPY (EGD) WITH PROPOFOL;  Surgeon: Toledo, [name], MD;  Location: [facility];  Service: Gastroenterology;  Laterality: N/A;  . EYE SURGERY    . Eyelid Surgery  2012  . INTRAMEDULLARY (IM) NAIL INTERTROCHANTERIC Left 10/30/2015   Procedure: INTRAMEDULLARY (IM) NAIL INTERTROCHANTRIC ;  Surgeon: Kennedy Bucker, MD;  Location: [facility];  Service: Orthopedics;  Laterality: Left;  . KYPHOPLASTY N/A 10/25/2018   Procedure: L4 KYPHOPLASTY;  Surgeon: Kennedy Bucker, MD;  Location: [facility];  Service: Orthopedics;  Laterality: N/A;  . LAPAROSCOPIC HYSTERECTOMY  2000   total  . LOWER EXTREMITY ANGIOGRAPHY Left 03/08/2017   Procedure: LOWER EXTREMITY ANGIOGRAPHY;  Surgeon: Annice Needy, MD;  Location: [facility];  Service: Cardiovascular;  Laterality: Left;  . LOWER EXTREMITY ANGIOGRAPHY Left 10/30/2017   Procedure: LOWER EXTREMITY ANGIOGRAPHY;  Surgeon: Annice Needy, MD;  Location: [facility];  Service: Cardiovascular;  Laterality: Left;  . LOWER EXTREMITY ANGIOGRAPHY Right 03/08/2018   Procedure: LOWER EXTREMITY ANGIOGRAPHY;  Surgeon: Annice Needy, MD;  Location: [facility];  Service: Cardiovascular;  Laterality: Right;  . LOWER EXTREMITY ANGIOGRAPHY Left 10/01/2018   Procedure: LOWER EXTREMITY ANGIOGRAPHY;  Surgeon: Annice Needy, MD;  Location: [facility];  Service: Cardiovascular;  Laterality: Left;  . LOWER EXTREMITY ANGIOGRAPHY Right 10/08/2018   Procedure: LOWER EXTREMITY ANGIOGRAPHY;  Surgeon: Annice Needy, MD;  Location: [facility];  Service: Cardiovascular;  Laterality: Right;  . LOWER EXTREMITY ANGIOGRAPHY Right 05/07/2020   Procedure: Lower Extremity Angiography;  Surgeon: Annice Needy, MD;  Location: [facility];  Service: Cardiovascular;  Laterality: Right;  . PERIPHERAL VASCULAR INTERVENTION  03/08/2018   Procedure: PERIPHERAL VASCULAR INTERVENTION;  Surgeon: Annice Needy, MD;  Location: [facility];  Service: Cardiovascular;;  . PORTA CATH INSERTION N/A 02/17/2020   Procedure: PORTA CATH INSERTION;  Surgeon: Annice Needy, MD;  Location: [facility];  Service: Cardiovascular;  Laterality: N/A;  . REDUCTION MAMMAPLASTY  1997  . SACROPLASTY N/A 10/25/2018   Procedure: S1 SACROPLASTY;  Surgeon: Kennedy Bucker, MD;  Location: [facility];  Service: Orthopedics;  Laterality: N/A;    FAMILY HISTORY Family History  Problem Relation Age of Onset  . Coronary artery disease Father   . Heart attack Father   . Coronary artery disease Mother   . Heart attack Mother   . Ovarian cancer Sister 25       sister had hormonal therapy for IVF txs-which increased risk factor for ovarian cancer  . Breast cancer Neg Hx     SOCIAL HISTORY Social History   Tobacco Use  . Smoking status: Former Smoker    Packs/day: 1.00    Years: 20.00    Pack years: 20.00    Types: Cigarettes    Quit date: 03/07/1996    Years since quitting: 24.2  . Smokeless tobacco: Never Used  . Tobacco comment: started smoking at age 57  Vaping Use  . Vaping Use: Never used  Substance Use Topics  . Alcohol use: No    Alcohol/week: 0.0 standard drinks  . Drug use: No         OPHTHALMIC EXAM:  Base Eye Exam    Visual Acuity (Snellen - Linear)      Right Left   Dist [place] 20/30 20/20   Dist  ph [place]  20/25        Tonometry (Tonopen, 2:41 PM)      Right Left   Pressure 21 21       Pupils      Dark Light Shape React APD   Right 3 2 Round Minimal None   Left 3 2 Round Minimal None       Visual Fields (Counting fingers)      Left Right    Full Full       Extraocular Movement      Right Left    Full, Ortho Full, Ortho       Neuro/Psych    Oriented x3: Yes   Mood/Affect: Normal       Dilation    Both eyes: 1.0% Mydriacyl, 2.5% Phenylephrine @ 2:41 PM        Slit Lamp and Fundus Exam    External Exam      Right Left   External Normal Normal       Slit Lamp Exam      Right Left   Lids/Lashes dermatochalasis dermatochalasis   Conjunctiva/Sclera White and quiet White and quiet   Cornea arcus; well healed cataract wound; 2-3+ diffuse Punctate epithelial erosions, decreased TBUT, mild Anterior basement membrane dystrophy superiorly arcus; well healed cataract wound, 2-3+ diffuse Punctate epithelial erosions, irregualr epi surface, decreased TBUT   Anterior Chamber Deep and quiet Deep and quiet   Iris Round and dilated Round and dilated   Lens PCIOL; open PC PCIOL; open PC   Vitreous syneresis, Posterior vitreous detachment, vitreous condensations inferiorly syneresis, Posterior vitreous detachment       Fundus Exam      Right Left   Disc Superior hyperemia and mild edema - improved, mild Pallor Pink and Sharp   C/D Ratio 0.5 0.5   Macula Flat, Blunted foveal reflex, cystic changes slightly increased, +Epiretinal membrane, no heme flat; good foveal reflex, no heme or edema, small pigment clump IT to fovea   Vessels attenuated, Tortuous Vascular attenuation   Periphery attached; MAs/DBH temporal macula Attached, no heme          IMAGING AND PROCEDURES  Imaging and Procedures for 04/25/17  OCT, Retina - OU - Both Eyes       Right Eye Quality was good. Central Foveal Thickness: 358. Progression has been stable. Findings include abnormal foveal contour,  epiretinal membrane, no SRF, intraretinal fluid (Persistent cystic changes IT fovea).   Left Eye Quality was good. Central Foveal Thickness: 280. Progression has been stable. Findings include normal foveal contour, no IRF, no SRF (Trace ERM).   Notes *Images captured and stored on drive  Diagnosis / Impression:  OD: BRVO -- Persistent cystic changes IT fovea OS: NFP; no IRF/SRF--stable, trace ERM  Clinical management:  See below  Abbreviations: NFP - Normal foveal profile. CME - cystoid macular edema. PED - pigment epithelial detachment. IRF - intraretinal fluid. SRF - subretinal fluid. EZ - ellipsoid zone. ERM - epiretinal membrane. ORA - outer retinal atrophy. ORT - outer retinal tubulation. SRHM - subretinal hyper-reflective material        Intravitreal Injection, Pharmacologic Agent - OD - Right Eye       Time Out 05/14/2020. 3:37 PM. Confirmed correct patient, procedure, site, and patient consented.   Anesthesia Topical anesthesia was used. Anesthetic medications included Lidocaine 2%, Proparacaine 0.5%.   Procedure Preparation included 5% betadine to ocular surface, eyelid speculum. A (32g) needle was used.   Injection:  2  mg aflibercept Gretta Cool) SOLN   NDC: L6038910, Lot: [id number], Expiration date: 12/03/2020   Route: Intravitreal, Site: Right Eye, Waste: 0.05 mL  Post-op Post injection exam found visual acuity of at least counting fingers. The patient tolerated the procedure well. There were no complications. The patient received written and verbal post procedure care education. Post injection medications were not given.                 ASSESSMENT/PLAN:    ICD-10-CM   1. Branch retinal vein occlusion of right eye with macular edema  H34.8310 Intravitreal Injection, Pharmacologic Agent - OD - Right Eye    aflibercept (EYLEA) SOLN 2 mg  2. Retinal edema  H35.81 OCT, Retina - OU - Both Eyes  3. Both eyes affected by mild nonproliferative diabetic  retinopathy with macular edema, associated with type 2 diabetes mellitus (HCC)  U98.1191   4. Essential hypertension  I10   5. Hypertensive retinopathy of both eyes  H35.033   6. Epiretinal membrane (ERM) of right eye  H35.371   7. Pseudophakia of both eyes  Z96.1     1,2. BRVO w/ CME OD  **delayed f/u -- 5.5 wks instead of 4 -- admitted for 4th toe amputation, R foot  - by history, pt states symptoms first noticed 2 wks prior to presentation, but reports changes may have occurred prior  - initial exam with differential tortuosity of vessels (OD > OS)  - FA (02.10.20) shows mild late staining / leakage in macula, staining / leakage of disc -- improving CME  - differential includes DM2 (DME), hypertensive retinopathy, inflammatory etiology / uveitis  - S/P IVA OD #1 (02.08.19), #2 (03.11.19), #3 (04.09.19), #4 (05.20.19), #5 (02.10.20)  - gave IVA OD on 2.10.20 due to pending Eylea4U for 2020 -- resulted in increased IRF/CME  - review of OCTs show persistent IRF and cystic changes --  resistance to IVA   - June 2019 -- switched therapies: S/P IVE OD #1 (06.24.19), #2 (07.24.19), #3 (09.04.19), #4 (10.30.19),#5 (12.30.19), #6 (03.23.20), #7 (05.05.20), #8 (07.16.20), #9 (07.17.20), #10 (08.28.20), #11 (10.13.20), # 12 (11.17.20), #13 (2.8.21), #14 (03.09.21), #15 (04.13.21), #16 (05.11.21), #17 (06.17.21), #18 (07.23.21), #19 (08.30.21), #20 (10.04.21), #21 (11.08.21), #22 (12.08.21), #23 (01.31.22), #24 (02.28.22)  - OCT today shows persistent cystic changes IT fovea at 5.5 wks (delayed from 4)  - BCVA 20/30 -- stable  - Eylea4U benefits investigation completed and pt approved for IVE for 2022  - recommend IVE OD #25 today, 04.01.22 w/ f/u in 4 wks  - RBA of procedure discussed, questions answered  - informed consent obtained  - see procedure note  - Eylea informed consent form obtained and scanned on 11.19.2020  - f/u 4 weeks  -- DFE/OCT/possible injection  3. Mild nonproliferative  diabetic retinopathy, both eyes  - A1c 6.3 on 5.2.22  - could be contributing to CME OD  - OS with minimal diabetic retinopathy  - continue to monitor  4,5. Hypertensive retinopathy OU - stable  - as above, may have contributing to CME OD  - discussed importance of tight BP control  - monitor  6. Epiretinal membrane, right eye   - stable nasal ERM  - no indication for surgery at this time  7. Pseudophakia OU  - s/p CE/IOL OU by [facility] surgeon in [facility]  - doing well  - monitor    Ophthalmic Meds Ordered this visit:  Meds ordered this encounter  Medications  . aflibercept (EYLEA) SOLN 2 mg  Return in about 4 weeks (around 06/11/2020) for f/u BRVO OD, DFE, OCT.  This document serves as a record of services personally performed by Karie ChimeraBrian G. Magdiel Bartles, MD, PhD. It was created on their behalf by Glee ArvinAmanda J. Manson PasseyBrown, OA an ophthalmic technician. The creation of this record is the provider's dictation and/or activities during the visit.    Electronically signed by: Glee ArvinAmanda J. Manson PasseyBrown, [place] 05.12.2022 4:32 PM  Karie ChimeraBrian G. Emma Birchler, M.D., Ph.D. Diseases & Surgery of the Retina and Vitreous [facility]  I have reviewed the above documentation for accuracy and completeness, and I agree with the above. Karie ChimeraBrian G. Kimorah Ridolfi, M.D., Ph.D. 05/14/20 4:32 PM   Abbreviations: M myopia (nearsighted); A astigmatism; H hyperopia (farsighted); P presbyopia; Mrx spectacle prescription;  CTL contact lenses; OD right eye; OS left eye; OU both eyes  XT exotropia; ET esotropia; PEK punctate epithelial keratitis; PEE punctate epithelial erosions; DES dry eye syndrome; MGD meibomian gland dysfunction; ATs artificial tears; PFAT's preservative free artificial tears; NSC nuclear sclerotic cataract; PSC posterior subcapsular cataract; ERM epi-retinal membrane; PVD posterior vitreous detachment; RD retinal detachment; DM diabetes mellitus; DR diabetic retinopathy; NPDR non-proliferative diabetic  retinopathy; PDR proliferative diabetic retinopathy; CSME clinically significant macular edema; DME diabetic macular edema; dbh dot blot hemorrhages; CWS cotton wool spot; POAG primary open angle glaucoma; C/D cup-to-disc ratio; HVF humphrey visual field; GVF goldmann visual field; OCT optical coherence tomography; IOP intraocular pressure; BRVO Branch retinal vein occlusion; CRVO central retinal vein occlusion; CRAO central retinal artery occlusion; BRAO branch retinal artery occlusion; RT retinal tear; SB scleral buckle; PPV pars plana vitrectomy; VH Vitreous hemorrhage; PRP panretinal laser photocoagulation; IVK intravitreal kenalog; VMT vitreomacular traction; MH Macular hole;  NVD neovascularization of the disc; NVE neovascularization elsewhere; AREDS age related eye disease study; ARMD age related macular degeneration; POAG primary open angle glaucoma; EBMD epithelial/anterior basement membrane dystrophy; ACIOL anterior chamber intraocular lens; IOL intraocular lens; PCIOL posterior chamber intraocular lens; Phaco/IOL phacoemulsification with intraocular lens placement; PRK photorefractive keratectomy; LASIK laser assisted in situ keratomileusis; HTN hypertension; DM diabetes mellitus; COPD chronic obstructive pulmonary disease

## 2020-06-10 ENCOUNTER — Telehealth: Payer: Self-pay

## 2020-06-16 NOTE — Progress Notes (Signed)
[facility] - Clinic Note  06/17/2020     CHIEF COMPLAINT Patient presents for Retina Follow Up   HISTORY OF PRESENT ILLNESS: Cheryl Leonard is a 75 y.o. female who presents to the clinic today for:   HPI     Retina Follow Up   Patient presents with  CRVO/BRVO.  In right eye.  This started 4 weeks ago.  I, the attending physician,  performed the HPI with the patient and updated documentation appropriately.        Comments   Patient here for 4 weeks retina follow up for BRVO OD. Patient states vision the same. No eye pain. Has a tiny dot flying around.       Last edited by [name], MD on 06/17/2020  5:19 PM.    Patient    Referring physician: Danella Penton, MD [phone] [facility] [address] [facility] [place],  [place] [address]  HISTORICAL INFORMATION:   Selected notes from the MEDICAL RECORD NUMBER Referred by Dr. Senaida Ores for concern of DME OD Lab Results  Component Value Date   HGBA1C 6.3 (H) 05/04/2020       CURRENT MEDICATIONS: No current outpatient medications on file. (Ophthalmic Drugs)   No current facility-administered medications for this visit. (Ophthalmic Drugs)   Current Outpatient Medications (Other)  Medication Sig   ALPRAZolam (XANAX) 0.25 MG tablet Take 0.25 mg by mouth daily as needed for anxiety or sleep.    amLODipine (NORVASC) 5 MG tablet Take 5 mg by mouth daily.   aspirin EC 81 MG tablet Take 81 mg by mouth daily.    cholecalciferol (VITAMIN D) 1000 units tablet Take 1,000 Units by mouth 2 (two) times daily.   CORAL CALCIUM PO Take 1 tablet by mouth 2 (two) times daily.    denosumab (PROLIA) 60 MG/ML SOLN injection Inject 60 mg into the skin every 6 (six) months.    DULoxetine (CYMBALTA) 30 MG capsule Take 30 mg by mouth daily.   estradiol (ESTRACE) 0.1 MG/GM vaginal cream Apply one pea-sized amount around the opening of the urethra daily for two weeks, then three times weekly thereafter.    famotidine (PEPCID) 40 MG tablet Take 40 mg by mouth daily.   furosemide (LASIX) 20 MG tablet Take 20 mg by mouth daily as needed for fluid or edema.    gabapentin (NEURONTIN) 100 MG capsule Take 1 capsule by mouth 2 (two) times daily.   Insulin Pen Needle 31G X 5 MM MISC once daily.   levothyroxine (SYNTHROID, LEVOTHROID) 100 MCG tablet Take 100 mcg by mouth daily before breakfast.    lidocaine-prilocaine (EMLA) cream Apply 1 application topically as needed (apply prior to port a cath access).   Magnesium 500 MG TABS Take 500 mg by mouth every morning.    metFORMIN (GLUCOPHAGE) 1000 MG tablet Take 1 tablet by mouth 2 (two) times daily with a meal.   metoprolol succinate (TOPROL-XL) 50 MG 24 hr tablet Take 50 mg by mouth daily.   mirabegron ER (MYRBETRIQ) 50 MG TB24 tablet Take 1 tablet (50 mg total) by mouth daily.   mirtazapine (REMERON) 15 MG tablet Take 15 mg by mouth at bedtime.   Multiple Vitamin (MULTIVITAMIN WITH MINERALS) TABS tablet Take 1 tablet by mouth daily.   olmesartan (BENICAR) 20 MG tablet Take 20 mg by mouth daily.   pantoprazole (PROTONIX) 40 MG tablet Take 40 mg by mouth every morning.    rivaroxaban (XARELTO) 20 MG TABS tablet  Take 1 tablet (20 mg total) by mouth daily with supper.   rosuvastatin (CRESTOR) 20 MG tablet Take 20 mg by mouth every morning.    TRESIBA FLEXTOUCH 200 UNIT/ML SOPN Inject 30 Units as directed at bedtime.    vitamin B-12 (CYANOCOBALAMIN) 100 MCG tablet Take 100 mcg by mouth daily.   vitamin E 400 UNIT capsule Take 400 Units by mouth daily.   zolpidem (AMBIEN) 5 MG tablet Take 1 tablet (5 mg total) by mouth at bedtime as needed for sleep.   No current facility-administered medications for this visit. (Other)      REVIEW OF SYSTEMS: ROS   Positive for: Neurological, Genitourinary, Musculoskeletal, Endocrine, Eyes Negative for: Constitutional, Gastrointestinal, Skin, HENT, Cardiovascular, Respiratory, Psychiatric, Allergic/Imm, Heme/Lymph Last  edited by Laddie Aquas, COA on 06/17/2020  2:45 PM.        ALLERGIES Allergies  Allergen Reactions   Ace Inhibitors Other (See Comments)    PAST MEDICAL HISTORY Past Medical History:  Diagnosis Date   Anemia    Anxiety    Arthritis    Gout   Cataracts, both eyes    Diabetic retinopathy (HCC)    NPDR OU   Diabetic retinopathy (HCC)    GERD (gastroesophageal reflux disease)    Gout    Headache    h/o migraines   History of fracture of patella    right knee   History of positive PPD    Patient always shows positive   Hyperlipidemia    Hypertension    Hypertensive retinopathy    OU   Hypothyroidism    Lichen sclerosus 12/30/2013   of vulva   Metatarsal fracture    Neuropathy    Peripheral vascular disease (HCC)    Polyneuropathy    numbness and tingling in feet and toes   Renal insufficiency    Stage 3   Sleep apnea    does not use cpap-lost weight    Type 2 diabetes mellitus, uncontrolled (HCC)    Past Surgical History:  Procedure Laterality Date   ABDOMINAL HYSTERECTOMY     AMPUTATION TOE Right 05/08/2020   Procedure: AMPUTATION TOE-Right 4th Toe;  Surgeon: Rosetta Posner, DPM;  Location: [facility];  Service: Podiatry;  Laterality: Right;   APPENDECTOMY     BREAST REDUCTION SURGERY  2001   CATARACT EXTRACTION     CESAREAN SECTION  1976   COLONOSCOPY  03/05/2013   Nml - due for repeat 03/06/2018   COLONOSCOPY WITH PROPOFOL N/A 03/18/2019   Procedure: COLONOSCOPY WITH PROPOFOL;  Surgeon: Toledo, [name], MD;  Location: [facility];  Service: Gastroenterology;  Laterality: N/A;   DIAGNOSTIC LAPAROSCOPY     DILATION AND CURETTAGE OF UTERUS  1989   ENDARTERECTOMY FEMORAL Bilateral 03/09/2018   Procedure: ENDARTERECTOMY FEMORAL;  Surgeon: Annice Needy, MD;  Location: [facility];  Service: Vascular;  Laterality: Bilateral;   ENDARTERECTOMY POPLITEAL Left 03/09/2018   Procedure: ENDARTERECTOMY POPLITEAL AND SFA;  Surgeon: Annice Needy, MD;  Location: [facility];   Service: Vascular;  Laterality: Left;   ESOPHAGOGASTRODUODENOSCOPY  03/05/2013   ESOPHAGOGASTRODUODENOSCOPY (EGD) WITH PROPOFOL N/A 03/18/2019   Procedure: ESOPHAGOGASTRODUODENOSCOPY (EGD) WITH PROPOFOL;  Surgeon: Toledo, [name], MD;  Location: [facility];  Service: Gastroenterology;  Laterality: N/A;   EYE SURGERY     Eyelid Surgery  2012   INTRAMEDULLARY (IM) NAIL INTERTROCHANTERIC Left 10/30/2015   Procedure: INTRAMEDULLARY (IM) NAIL INTERTROCHANTRIC ;  Surgeon: Kennedy Bucker, MD;  Location: [facility];  Service: Orthopedics;  Laterality: Left;   KYPHOPLASTY N/A 10/25/2018   Procedure: L4 KYPHOPLASTY;  Surgeon: Kennedy BuckerMenz, Michael, MD;  Location: [facility];  Service: Orthopedics;  Laterality: N/A;   LAPAROSCOPIC HYSTERECTOMY  2000   total   LOWER EXTREMITY ANGIOGRAPHY Left 03/08/2017   Procedure: LOWER EXTREMITY ANGIOGRAPHY;  Surgeon: Annice Needyew, Jason S, MD;  Location: [facility];  Service: Cardiovascular;  Laterality: Left;   LOWER EXTREMITY ANGIOGRAPHY Left 10/30/2017   Procedure: LOWER EXTREMITY ANGIOGRAPHY;  Surgeon: Annice Needyew, Jason S, MD;  Location: [facility];  Service: Cardiovascular;  Laterality: Left;   LOWER EXTREMITY ANGIOGRAPHY Right 03/08/2018   Procedure: LOWER EXTREMITY ANGIOGRAPHY;  Surgeon: Annice Needyew, Jason S, MD;  Location: [facility];  Service: Cardiovascular;  Laterality: Right;   LOWER EXTREMITY ANGIOGRAPHY Left 10/01/2018   Procedure: LOWER EXTREMITY ANGIOGRAPHY;  Surgeon: Annice Needyew, Jason S, MD;  Location: [facility];  Service: Cardiovascular;  Laterality: Left;   LOWER EXTREMITY ANGIOGRAPHY Right 10/08/2018   Procedure: LOWER EXTREMITY ANGIOGRAPHY;  Surgeon: Annice Needyew, Jason S, MD;  Location: [facility];  Service: Cardiovascular;  Laterality: Right;   LOWER EXTREMITY ANGIOGRAPHY Right 05/07/2020   Procedure: Lower Extremity Angiography;  Surgeon: Annice Needyew, Jason S, MD;  Location: [facility];  Service: Cardiovascular;  Laterality: Right;   PERIPHERAL  VASCULAR INTERVENTION  03/08/2018   Procedure: PERIPHERAL VASCULAR INTERVENTION;  Surgeon: Annice Needyew, Jason S, MD;  Location: [facility];  Service: Cardiovascular;;   PORTA CATH INSERTION N/A 02/17/2020   Procedure: PORTA CATH INSERTION;  Surgeon: Annice Needyew, Jason S, MD;  Location: [facility];  Service: Cardiovascular;  Laterality: N/A;   REDUCTION MAMMAPLASTY  1997   SACROPLASTY N/A 10/25/2018   Procedure: S1 SACROPLASTY;  Surgeon: Kennedy BuckerMenz, Michael, MD;  Location: [facility];  Service: Orthopedics;  Laterality: N/A;    FAMILY HISTORY Family History  Problem Relation Age of Onset   Coronary artery disease Father    Heart attack Father    Coronary artery disease Mother    Heart attack Mother    Ovarian cancer Sister [phone]       sister had hormonal therapy for IVF txs-which increased risk factor for ovarian cancer   Breast cancer Neg Hx     SOCIAL HISTORY Social History   Tobacco Use   Smoking status: Former    Packs/day: 1.00    Years: 20.00    Pack years: 20.00    Types: Cigarettes    Quit date: 03/07/1996    Years since quitting: 24.2   Smokeless tobacco: Never   Tobacco comments:    started smoking at age 75  Vaping Use   Vaping Use: Never used  Substance Use Topics   Alcohol use: No    Alcohol/week: 0.0 standard drinks   Drug use: No         OPHTHALMIC EXAM:  Base Eye Exam     Visual Acuity (Snellen - Linear)       Right Left   Dist [place] 20/30 20/20 -1   Dist ph [place] NI          Tonometry (Tonopen, 2:42 PM)       Right Left   Pressure 16 15         Pupils       Dark Light Shape React APD   Right 3 2 Round Minimal None   Left 3 2 Round Minimal None         Visual Fields (Counting fingers)  Left Right    Full Full         Extraocular Movement       Right Left    Full, Ortho Full, Ortho         Neuro/Psych     Oriented x3: Yes   Mood/Affect: Normal         Dilation     Both eyes: 1.0% Mydriacyl, 2.5% Phenylephrine @  2:42 PM           Slit Lamp and Fundus Exam     External Exam       Right Left   External Normal Normal         Slit Lamp Exam       Right Left   Lids/Lashes dermatochalasis dermatochalasis   Conjunctiva/Sclera White and quiet White and quiet   Cornea arcus; well healed cataract wound; 2-3+ diffuse Punctate epithelial erosions, decreased TBUT, mild Anterior basement membrane dystrophy superiorly arcus; well healed cataract wound, 2-3+ diffuse Punctate epithelial erosions, irregualr epi surface, decreased TBUT   Anterior Chamber Deep and quiet Deep and quiet   Iris Round and dilated Round and dilated   Lens PCIOL; open PC PCIOL; open PC   Vitreous syneresis, Posterior vitreous detachment, vitreous condensations inferiorly syneresis, Posterior vitreous detachment         Fundus Exam       Right Left   Disc Superior hyperemia and mild edema - improved, mild Pallor Pink and Sharp   C/D Ratio 0.5 0.5   Macula Flat, Blunted foveal reflex, cystic changes slightly improved, +Epiretinal membrane, no heme flat; good foveal reflex, no heme or edema, small pigment clump IT to fovea   Vessels attenuated, Tortuous Vascular attenuation   Periphery Attached; focal dot heme temporal periphery Attached, no heme            IMAGING AND PROCEDURES  Imaging and Procedures for 04/25/17  OCT, Retina - OU - Both Eyes       Right Eye Quality was good. Central Foveal Thickness: 334. Progression has improved. Findings include abnormal foveal contour, epiretinal membrane, no SRF, intraretinal fluid (Interval improvement in IRF/cystic changes IT fovea).   Left Eye Quality was good. Central Foveal Thickness: 286. Progression has been stable. Findings include normal foveal contour, no IRF, no SRF (Trace ERM).   Notes *Images captured and stored on drive  Diagnosis / Impression:  OD: BRVO -- Interval improvement in IRF/cystic changes IT fovea OS: NFP; no IRF/SRF--stable, trace  ERM  Clinical management:  See below  Abbreviations: NFP - Normal foveal profile. CME - cystoid macular edema. PED - pigment epithelial detachment. IRF - intraretinal fluid. SRF - subretinal fluid. EZ - ellipsoid zone. ERM - epiretinal membrane. ORA - outer retinal atrophy. ORT - outer retinal tubulation. SRHM - subretinal hyper-reflective material      Intravitreal Injection, Pharmacologic Agent - OD - Right Eye       Time Out 06/17/2020. 3:33 PM. Confirmed correct patient, procedure, site, and patient consented.   Anesthesia Topical anesthesia was used. Anesthetic medications included Lidocaine 2%, Proparacaine 0.5%.   Procedure Preparation included 5% betadine to ocular surface, eyelid speculum. A (32g) needle was used.   Injection: 2 mg aflibercept 2 MG/0.05ML   Route: Intravitreal, Site: Right Eye   NDC: L6038910, Lot: [id number], Expiration date: 02/03/2021, Waste: 0.05 mL   Post-op Post injection exam found visual acuity of at least counting fingers. The patient tolerated the procedure well. There were no complications. The patient received  written and verbal post procedure care education. Post injection medications were not given.               ASSESSMENT/PLAN:    ICD-10-CM   1. Branch retinal vein occlusion of right eye with macular edema  H34.8310 Intravitreal Injection, Pharmacologic Agent - OD - Right Eye    aflibercept (EYLEA) SOLN 2 mg    2. Retinal edema  H35.81 OCT, Retina - OU - Both Eyes    3. Both eyes affected by mild nonproliferative diabetic retinopathy with macular edema, associated with type 2 diabetes mellitus (HCC)  Z61.0960     4. Essential hypertension  I10     5. Hypertensive retinopathy of both eyes  H35.033     6. Epiretinal membrane (ERM) of right eye  H35.371     7. Pseudophakia of both eyes  Z96.1      1,2. BRVO w/ CME OD  - by history, pt states symptoms first noticed 2 wks prior to presentation, but reports changes may  have occurred prior  - initial exam with differential tortuosity of vessels (OD > OS)  - FA (02.10.20) shows mild late staining / leakage in macula, staining / leakage of disc -- improving CME  - differential includes DM2 (DME), hypertensive retinopathy, inflammatory etiology / uveitis  - S/P IVA OD #1 (02.08.19), #2 (03.11.19), #3 (04.09.19), #4 (05.20.19), #5 (02.10.20)  - gave IVA OD on 2.10.20 due to pending Eylea4U for 2020 -- resulted in increased IRF/CME  - review of OCTs show persistent IRF and cystic changes --  resistance to IVA   - June 2019 -- switched therapies: S/P IVE OD #1 (06.24.19), #2 (07.24.19), #3 (09.04.19), #4 (10.30.19),#5 (12.30.19), #6 (03.23.20), #7 (05.05.20), #8 (07.16.20), #9 (07.17.20), #10 (08.28.20), #11 (10.13.20), # 12 (11.17.20), #13 (2.8.21), #14 (03.09.21), #15 (04.13.21), #16 (05.11.21), #17 (06.17.21), #18 (07.23.21), #19 (08.30.21), #20 (10.04.21), #21 (11.08.21), #22 (12.08.21), #23 (01.31.22), #24 (02.28.22), #25 (04.01.22)  - OCT today shows interval imporvement in IRF/cystic changes IT fovea at 4 weeks  - BCVA 20/30 -- stable  - Eylea4U benefits investigation completed and pt approved for IVE for 2022  - recommend IVE OD #26 today, 06.15.22 w/ f/u in 4 wks  - RBA of procedure discussed, questions answered  - informed consent obtained  - see procedure note  - Eylea informed consent form obtained and scanned on 11.19.2020  - f/u 4 weeks  -- DFE/OCT/possible injection  3. Mild nonproliferative diabetic retinopathy, both eyes  - A1c 6.3 on 5.2.22  - could be contributing to CME OD  - OS with minimal diabetic retinopathy  - continue to monitor  4,5. Hypertensive retinopathy OU - stable  - as above, may have contributing to CME OD  - discussed importance of tight BP control  - monitor  6. Epiretinal membrane, right eye   - stable nasal ERM  - no indication for surgery at this time  7. Pseudophakia OU  - s/p CE/IOL OU by cataract surgeon in  [facility]  - doing well  - monitor    Ophthalmic Meds Ordered this visit:  Meds ordered this encounter  Medications   aflibercept (EYLEA) SOLN 2 mg      Return in about 4 weeks (around 07/15/2020) for f/u BRVO OD, DFE, OCT.  This document serves as a record of services personally performed by Karie Chimera, MD, PhD. It was created on their behalf by Annalee Genta, COMT. The creation of this record is the provider's dictation  and/or activities during the visit.  Electronically signed by: Annalee Genta, COMT 06/17/20 5:21 PM  Karie Chimera, M.D., Ph.D. Diseases & Surgery of the Retina and Vitreous [facility]  I have reviewed the above documentation for accuracy and completeness, and I agree with the above. Karie Chimera, M.D., Ph.D. 06/17/20 5:21 PM   Abbreviations: M myopia (nearsighted); A astigmatism; H hyperopia (farsighted); P presbyopia; Mrx spectacle prescription;  CTL contact lenses; OD right eye; OS left eye; OU both eyes  XT exotropia; ET esotropia; PEK punctate epithelial keratitis; PEE punctate epithelial erosions; DES dry eye syndrome; MGD meibomian gland dysfunction; ATs artificial tears; PFAT's preservative free artificial tears; NSC nuclear sclerotic cataract; PSC posterior subcapsular cataract; ERM epi-retinal membrane; PVD posterior vitreous detachment; RD retinal detachment; DM diabetes mellitus; DR diabetic retinopathy; NPDR non-proliferative diabetic retinopathy; PDR proliferative diabetic retinopathy; CSME clinically significant macular edema; DME diabetic macular edema; dbh dot blot hemorrhages; CWS cotton wool spot; POAG primary open angle glaucoma; C/D cup-to-disc ratio; HVF humphrey visual field; GVF goldmann visual field; OCT optical coherence tomography; IOP intraocular pressure; BRVO Branch retinal vein occlusion; CRVO central retinal vein occlusion; CRAO central retinal artery occlusion; BRAO branch retinal artery occlusion; RT retinal tear;  SB scleral buckle; PPV pars plana vitrectomy; VH Vitreous hemorrhage; PRP panretinal laser photocoagulation; IVK intravitreal kenalog; VMT vitreomacular traction; MH Macular hole;  NVD neovascularization of the disc; NVE neovascularization elsewhere; AREDS age related eye disease study; ARMD age related macular degeneration; POAG primary open angle glaucoma; EBMD epithelial/anterior basement membrane dystrophy; ACIOL anterior chamber intraocular lens; IOL intraocular lens; PCIOL posterior chamber intraocular lens; Phaco/IOL phacoemulsification with intraocular lens placement; PRK photorefractive keratectomy; LASIK laser assisted in situ keratomileusis; HTN hypertension; DM diabetes mellitus; COPD chronic obstructive pulmonary disease

## 2020-06-17 ENCOUNTER — Encounter (INDEPENDENT_AMBULATORY_CARE_PROVIDER_SITE_OTHER): Payer: Self-pay | Admitting: Ophthalmology

## 2020-06-17 ENCOUNTER — Other Ambulatory Visit: Payer: Self-pay

## 2020-06-17 ENCOUNTER — Ambulatory Visit (INDEPENDENT_AMBULATORY_CARE_PROVIDER_SITE_OTHER): Payer: Medicare Other | Admitting: Ophthalmology

## 2020-06-17 DIAGNOSIS — H35033 Hypertensive retinopathy, bilateral: Secondary | ICD-10-CM

## 2020-06-17 DIAGNOSIS — H35371 Puckering of macula, right eye: Secondary | ICD-10-CM

## 2020-06-17 DIAGNOSIS — Z961 Presence of intraocular lens: Secondary | ICD-10-CM

## 2020-06-17 DIAGNOSIS — E113213 Type 2 diabetes mellitus with mild nonproliferative diabetic retinopathy with macular edema, bilateral: Secondary | ICD-10-CM

## 2020-06-17 DIAGNOSIS — H34831 Tributary (branch) retinal vein occlusion, right eye, with macular edema: Secondary | ICD-10-CM | POA: Diagnosis not present

## 2020-06-17 DIAGNOSIS — I10 Essential (primary) hypertension: Secondary | ICD-10-CM | POA: Diagnosis not present

## 2020-06-17 DIAGNOSIS — H3581 Retinal edema: Secondary | ICD-10-CM | POA: Diagnosis not present

## 2020-06-17 MED ORDER — AFLIBERCEPT 2MG/0.05ML IZ SOLN FOR KALEIDOSCOPE
2.0000 mg | INTRAVITREAL | Status: AC | PRN
  Administered 2020-06-17: 2 mg via INTRAVITREAL

## 2020-07-10 ENCOUNTER — Other Ambulatory Visit: Payer: Self-pay | Admitting: *Deleted

## 2020-07-10 ENCOUNTER — Other Ambulatory Visit: Payer: Self-pay

## 2020-07-10 ENCOUNTER — Inpatient Hospital Stay: Payer: Medicare Other | Attending: Internal Medicine

## 2020-07-10 ENCOUNTER — Encounter: Payer: Self-pay | Admitting: Internal Medicine

## 2020-07-10 ENCOUNTER — Inpatient Hospital Stay: Payer: Medicare Other

## 2020-07-10 ENCOUNTER — Inpatient Hospital Stay (HOSPITAL_BASED_OUTPATIENT_CLINIC_OR_DEPARTMENT_OTHER): Payer: Medicare Other | Admitting: Internal Medicine

## 2020-07-10 VITALS — BP 130/56 | HR 81 | Resp 16

## 2020-07-10 DIAGNOSIS — Z79899 Other long term (current) drug therapy: Secondary | ICD-10-CM | POA: Insufficient documentation

## 2020-07-10 DIAGNOSIS — N189 Chronic kidney disease, unspecified: Secondary | ICD-10-CM | POA: Diagnosis present

## 2020-07-10 DIAGNOSIS — E1122 Type 2 diabetes mellitus with diabetic chronic kidney disease: Secondary | ICD-10-CM | POA: Insufficient documentation

## 2020-07-10 DIAGNOSIS — D631 Anemia in chronic kidney disease: Secondary | ICD-10-CM | POA: Diagnosis present

## 2020-07-10 DIAGNOSIS — N183 Chronic kidney disease, stage 3 unspecified: Secondary | ICD-10-CM

## 2020-07-10 DIAGNOSIS — D649 Anemia, unspecified: Secondary | ICD-10-CM

## 2020-07-10 DIAGNOSIS — I129 Hypertensive chronic kidney disease with stage 1 through stage 4 chronic kidney disease, or unspecified chronic kidney disease: Secondary | ICD-10-CM | POA: Insufficient documentation

## 2020-07-10 DIAGNOSIS — Z794 Long term (current) use of insulin: Secondary | ICD-10-CM | POA: Diagnosis not present

## 2020-07-10 LAB — CBC WITH DIFFERENTIAL/PLATELET
Abs Immature Granulocytes: 0.03 10*3/uL (ref 0.00–0.07)
Basophils Absolute: 0 10*3/uL (ref 0.0–0.1)
Basophils Relative: 0 %
Eosinophils Absolute: 0.3 10*3/uL (ref 0.0–0.5)
Eosinophils Relative: 4 %
HCT: 26.8 % — ABNORMAL LOW (ref 36.0–46.0)
Hemoglobin: 8.6 g/dL — ABNORMAL LOW (ref 12.0–15.0)
Immature Granulocytes: 0 %
Lymphocytes Relative: 16 %
Lymphs Abs: 1.1 10*3/uL (ref 0.7–4.0)
MCH: 28 pg (ref 26.0–34.0)
MCHC: 32.1 g/dL (ref 30.0–36.0)
MCV: 87.3 fL (ref 80.0–100.0)
Monocytes Absolute: 0.8 10*3/uL (ref 0.1–1.0)
Monocytes Relative: 11 %
Neutro Abs: 4.8 10*3/uL (ref 1.7–7.7)
Neutrophils Relative %: 69 %
Platelets: 294 10*3/uL (ref 150–400)
RBC: 3.07 MIL/uL — ABNORMAL LOW (ref 3.87–5.11)
RDW: 16.5 % — ABNORMAL HIGH (ref 11.5–15.5)
WBC: 7 10*3/uL (ref 4.0–10.5)
nRBC: 0 % (ref 0.0–0.2)

## 2020-07-10 LAB — BASIC METABOLIC PANEL
Anion gap: 8 (ref 5–15)
BUN: 41 mg/dL — ABNORMAL HIGH (ref 8–23)
CO2: 23 mmol/L (ref 22–32)
Calcium: 9.1 mg/dL (ref 8.9–10.3)
Chloride: 103 mmol/L (ref 98–111)
Creatinine, Ser: 1.91 mg/dL — ABNORMAL HIGH (ref 0.44–1.00)
GFR, Estimated: 27 mL/min — ABNORMAL LOW (ref >60)
Glucose, Bld: 285 mg/dL — ABNORMAL HIGH (ref 70–99)
Potassium: 4.7 mmol/L (ref 3.5–5.1)
Sodium: 134 mmol/L — ABNORMAL LOW (ref 135–145)

## 2020-07-10 LAB — IRON AND TIBC
Iron: 43 ug/dL (ref 28–170)
Saturation Ratios: 11 % (ref 10.4–31.8)
TIBC: 389 ug/dL (ref 250–450)
UIBC: 346 ug/dL

## 2020-07-10 LAB — FERRITIN: Ferritin: 16 ng/mL (ref 11–307)

## 2020-07-10 MED ORDER — HEPARIN SOD (PORK) LOCK FLUSH 100 UNIT/ML IV SOLN
INTRAVENOUS | Status: AC
  Filled 2020-07-10: qty 5

## 2020-07-10 MED ORDER — HEPARIN SOD (PORK) LOCK FLUSH 100 UNIT/ML IV SOLN
500.0000 [IU] | Freq: Once | INTRAVENOUS | Status: AC
Start: 2020-07-10 — End: 2020-07-10
  Administered 2020-07-10: 500 [IU] via INTRAVENOUS
  Filled 2020-07-10: qty 5

## 2020-07-10 MED ORDER — SODIUM CHLORIDE 0.9% FLUSH
10.0000 mL | Freq: Once | INTRAVENOUS | Status: DC
  Filled 2020-07-10: qty 10

## 2020-07-10 MED ORDER — SODIUM CHLORIDE 0.9 % IV SOLN
Freq: Once | INTRAVENOUS | Status: AC
Start: 2020-07-10 — End: 2020-07-10
  Filled 2020-07-10: qty 250

## 2020-07-10 MED ORDER — HEPARIN SOD (PORK) LOCK FLUSH 100 UNIT/ML IV SOLN
500.0000 [IU] | Freq: Once | INTRAVENOUS | Status: DC | PRN
  Filled 2020-07-10: qty 5

## 2020-07-10 MED ORDER — SODIUM CHLORIDE 0.9% FLUSH
10.0000 mL | Freq: Once | INTRAVENOUS | Status: DC | PRN
  Filled 2020-07-10: qty 10

## 2020-07-10 MED ORDER — IRON SUCROSE 20 MG/ML IV SOLN
200.0000 mg | Freq: Once | INTRAVENOUS | Status: AC
  Administered 2020-07-10: 200 mg via INTRAVENOUS
  Filled 2020-07-10: qty 10

## 2020-07-10 NOTE — Progress Notes (Signed)
[place] NOTE  Patient Care Team: Rusty Aus, MD as PCP - General (Internal Medicine) Josefine Class, MD as Referring Physician (Gastroenterology) Cammie Sickle, MD as Consulting Physician (Hematology and Oncology)  CHIEF COMPLAINTS/PURPOSE OF CONSULTATION: Anemia  HEMATOLOGY HISTORY  # ANEMIA- Jan 2021- 8.8/ferritin 11 [PCP]; N-WBC/platelets? IDA vs other- EGD-2015/colonoscopy-? 2015; 2020- [Dr.Skulskie] ; capsule-2016- ? 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  female anemia iron deficiency-question CKD-III s here for follow-up.   In the interim patient underwent to amputation for gangrene.   Complains of mild to moderate fatigue.  Denies any blood in stools or black-colored stools.  Review of Systems  Constitutional:  Positive for malaise/fatigue. Negative for chills, diaphoresis and fever.  HENT:  Negative for nosebleeds and sore throat.   Eyes:  Negative for double vision.  Respiratory:  Positive for shortness of breath. Negative for cough, hemoptysis, sputum production and wheezing.   Cardiovascular:  Negative for chest pain, palpitations, orthopnea and leg swelling.  Gastrointestinal:  Negative for abdominal pain, blood in stool, constipation, diarrhea, heartburn, melena, nausea and vomiting.  Genitourinary:  Negative for dysuria, frequency and urgency.  Musculoskeletal:  Positive for joint pain.  Skin: Negative.  Negative for itching and rash.  Neurological:  Negative for dizziness, tingling, focal weakness, weakness and headaches.  Endo/Heme/Allergies:  Does not bruise/bleed easily.  Psychiatric/Behavioral:  Negative for depression. The patient is not nervous/anxious and does not have insomnia.    MEDICAL HISTORY:  Past Medical History:  Diagnosis Date    Anemia    Anxiety    Arthritis    Gout   Cataracts, both eyes    Diabetic retinopathy ([place])    NPDR OU   Diabetic retinopathy ([place])    GERD (gastroesophageal reflux disease)    Gout    Headache    h/o migraines   History of fracture of patella    right knee   History of positive PPD    Patient always shows positive   Hyperlipidemia    Hypertension    Hypertensive retinopathy    OU   Hypothyroidism    Lichen sclerosus 65/78/4696   of vulva   Metatarsal fracture    Neuropathy    Peripheral vascular disease ([facility])    Polyneuropathy    numbness and tingling in feet and toes   Renal insufficiency    Stage 3   Sleep apnea    does not use cpap-lost weight    Type 2 diabetes mellitus, uncontrolled ([place])     SURGICAL HISTORY: Past Surgical History:  Procedure Laterality Date   ABDOMINAL HYSTERECTOMY     AMPUTATION TOE Right 05/08/2020   Procedure: AMPUTATION TOE-Right 4th Toe;  Surgeon: Caroline More, DPM;  Location: [facility];  Service: Podiatry;  Laterality: Right;   APPENDECTOMY     BREAST REDUCTION SURGERY  2001   CATARACT EXTRACTION     CESAREAN SECTION  1976   COLONOSCOPY  03/05/2013   Nml - due for repeat 03/06/2018   COLONOSCOPY WITH PROPOFOL N/A 03/18/2019   Procedure: COLONOSCOPY WITH PROPOFOL;  Surgeon: Toledo, Benay Pike, MD;  Location: [facility];  Service: Gastroenterology;  Laterality: N/A;   DIAGNOSTIC LAPAROSCOPY     DILATION AND CURETTAGE OF UTERUS  1989   ENDARTERECTOMY FEMORAL Bilateral 03/09/2018   Procedure: ENDARTERECTOMY  FEMORAL;  Surgeon: Algernon Huxley, MD;  Location: [facility];  Service: Vascular;  Laterality: Bilateral;   ENDARTERECTOMY POPLITEAL Left 03/09/2018   Procedure: ENDARTERECTOMY POPLITEAL AND SFA;  Surgeon: Algernon Huxley, MD;  Location: [facility];  Service: Vascular;  Laterality: Left;   ESOPHAGOGASTRODUODENOSCOPY  03/05/2013   ESOPHAGOGASTRODUODENOSCOPY (EGD) WITH PROPOFOL N/A 03/18/2019   Procedure: ESOPHAGOGASTRODUODENOSCOPY (EGD) WITH  PROPOFOL;  Surgeon: Toledo, Benay Pike, MD;  Location: [facility];  Service: Gastroenterology;  Laterality: N/A;   EYE SURGERY     Eyelid Surgery  2012   INTRAMEDULLARY (IM) NAIL INTERTROCHANTERIC Left 10/30/2015   Procedure: INTRAMEDULLARY (IM) NAIL INTERTROCHANTRIC ;  Surgeon: Hessie Knows, MD;  Location: [facility];  Service: Orthopedics;  Laterality: Left;   KYPHOPLASTY N/A 10/25/2018   Procedure: L4 KYPHOPLASTY;  Surgeon: Hessie Knows, MD;  Location: [facility];  Service: Orthopedics;  Laterality: N/A;   LAPAROSCOPIC HYSTERECTOMY  2000   total   LOWER EXTREMITY ANGIOGRAPHY Left 03/08/2017   Procedure: LOWER EXTREMITY ANGIOGRAPHY;  Surgeon: Algernon Huxley, MD;  Location: [place] [facility];  Service: Cardiovascular;  Laterality: Left;   LOWER EXTREMITY ANGIOGRAPHY Left 10/30/2017   Procedure: LOWER EXTREMITY ANGIOGRAPHY;  Surgeon: Algernon Huxley, MD;  Location: [place] [facility];  Service: Cardiovascular;  Laterality: Left;   LOWER EXTREMITY ANGIOGRAPHY Right 03/08/2018   Procedure: LOWER EXTREMITY ANGIOGRAPHY;  Surgeon: Algernon Huxley, MD;  Location: [place] [facility];  Service: Cardiovascular;  Laterality: Right;   LOWER EXTREMITY ANGIOGRAPHY Left 10/01/2018   Procedure: LOWER EXTREMITY ANGIOGRAPHY;  Surgeon: Algernon Huxley, MD;  Location: [facility] [address] [facility];  Service: Cardiovascular;  Laterality: Left;   LOWER EXTREMITY ANGIOGRAPHY Right 10/08/2018   Procedure: LOWER EXTREMITY ANGIOGRAPHY;  Surgeon: Algernon Huxley, MD;  Location: [place] [facility];  Service: Cardiovascular;  Laterality: Right;   LOWER EXTREMITY ANGIOGRAPHY Right 05/07/2020   Procedure: Lower Extremity Angiography;  Surgeon: Algernon Huxley, MD;  Location: [place] [facility];  Service: Cardiovascular;  Laterality: Right;   PERIPHERAL VASCULAR INTERVENTION  03/08/2018   Procedure: PERIPHERAL VASCULAR INTERVENTION;  Surgeon: Algernon Huxley, MD;  Location: [place] [facility];  Service: Cardiovascular;;   PORTA CATH INSERTION N/A  02/17/2020   Procedure: PORTA CATH INSERTION;  Surgeon: Algernon Huxley, MD;  Location: [place] [facility];  Service: Cardiovascular;  Laterality: N/A;   REDUCTION MAMMAPLASTY  1997   SACROPLASTY N/A 10/25/2018   Procedure: S1 SACROPLASTY;  Surgeon: Hessie Knows, MD;  Location: [facility];  Service: Orthopedics;  Laterality: N/A;    SOCIAL HISTORY: Social History   Socioeconomic History   Marital status: Married    Spouse name: [name]   Number of children: 3   Years of education: Not on file   Highest education level: Not on file  Occupational History   Occupation: Retail banker  Tobacco Use   Smoking status: Former    Packs/day: 1.00    Years: 20.00    Pack years: 20.00    Types: Cigarettes    Quit date: 03/07/1996    Years since quitting: 24.3   Smokeless tobacco: Never   Tobacco comments:    started smoking at age 35  Vaping Use   Vaping Use: Never used  Substance and Sexual Activity   Alcohol use: No    Alcohol/week: 0.0 standard drinks   Drug use: No   Sexual activity: Yes    Partners: Male    Birth control/protection: Surgical, Post-menopausal  Other Topics Concern  Not on file  Social History Narrative   Lives in [place]; with husband; quit > 20 years; no alcohol; used to work at [employer] at [employer].    Social Determinants of Health   Financial Resource Strain: Not on file  Food Insecurity: Not on file  Transportation Needs: Not on file  Physical Activity: Not on file  Stress: Not on file  Social Connections: Not on file  Intimate Partner Violence: Not on file    FAMILY HISTORY: Family History  Problem Relation Age of Onset   Coronary artery disease Father    Heart attack Father    Coronary artery disease Mother    Heart attack Mother    Ovarian cancer Sister 74       sister had hormonal therapy for IVF txs-which increased risk factor for ovarian cancer   Breast cancer Neg Hx     ALLERGIES:  is allergic to ace inhibitors.  MEDICATIONS:   Current Outpatient Medications  Medication Sig Dispense Refill   ALPRAZolam (XANAX) 0.25 MG tablet Take 0.25 mg by mouth daily as needed for anxiety or sleep.      amLODipine (NORVASC) 5 MG tablet Take 5 mg by mouth daily.     aspirin EC 81 MG tablet Take 81 mg by mouth daily.      cephALEXin (KEFLEX) 500 MG capsule Take 1 capsule by mouth 3 (three) times daily.     cholecalciferol (VITAMIN D) 1000 units tablet Take 1,000 Units by mouth 2 (two) times daily.     CORAL CALCIUM PO Take 1 tablet by mouth 2 (two) times daily.      denosumab (PROLIA) 60 MG/ML SOLN injection Inject 60 mg into the skin every 6 (six) months.      DULoxetine (CYMBALTA) 30 MG capsule Take 30 mg by mouth daily.     estradiol (ESTRACE) 0.1 MG/GM vaginal cream Apply one pea-sized amount around the opening of the urethra daily for two weeks, then three times weekly thereafter. 42.5 g 5   famotidine (PEPCID) 40 MG tablet Take 40 mg by mouth daily.     furosemide (LASIX) 20 MG tablet Take 20 mg by mouth daily as needed for fluid or edema.      gabapentin (NEURONTIN) 100 MG capsule Take 1 capsule by mouth 2 (two) times daily.     Insulin Pen Needle 31G X 5 MM MISC once daily.     levothyroxine (SYNTHROID, LEVOTHROID) 100 MCG tablet Take 100 mcg by mouth daily before breakfast.   3   lidocaine-prilocaine (EMLA) cream Apply 1 application topically as needed (apply prior to port a cath access). 30 g 3   Magnesium 500 MG TABS Take 500 mg by mouth every morning.      metFORMIN (GLUCOPHAGE) 1000 MG tablet Take 1 tablet by mouth 2 (two) times daily with a meal.     mirabegron ER (MYRBETRIQ) 50 MG TB24 tablet Take 1 tablet (50 mg total) by mouth daily. 28 tablet 0   Multiple Vitamin (MULTIVITAMIN WITH MINERALS) TABS tablet Take 1 tablet by mouth daily.     nystatin cream (MYCOSTATIN) Apply 1 application topically 2 (two) times daily.     olmesartan (BENICAR) 20 MG tablet Take 20 mg by mouth daily.     pantoprazole (PROTONIX) 40 MG  tablet Take 40 mg by mouth every morning.      rivaroxaban (XARELTO) 20 MG TABS tablet Take 1 tablet (20 mg total) by mouth daily with supper. 30 tablet 11  rosuvastatin (CRESTOR) 20 MG tablet Take 20 mg by mouth every morning.      TRESIBA FLEXTOUCH 200 UNIT/ML SOPN Inject 30 Units as directed at bedtime.   5   vitamin B-12 (CYANOCOBALAMIN) 100 MCG tablet Take 100 mcg by mouth daily.     vitamin E 400 UNIT capsule Take 400 Units by mouth daily.     zolpidem (AMBIEN) 5 MG tablet Take 1 tablet (5 mg total) by mouth at bedtime as needed for sleep. 30 tablet 0   metoprolol succinate (TOPROL-XL) 50 MG 24 hr tablet Take 50 mg by mouth daily.     mirtazapine (REMERON) 15 MG tablet Take 15 mg by mouth at bedtime.     No current facility-administered medications for this visit.      PHYSICAL EXAMINATION:   Vitals:   07/10/20 1344  BP: 130/63  Pulse: 87  Resp: 20  Temp: 99.2 F (37.3 C)   Filed Weights   07/10/20 1344  Weight: 172 lb 9.6 oz (78.3 kg)    Physical Exam Constitutional:      Comments: Alone.  Ambulating independently.  HENT:     Head: Normocephalic and atraumatic.     Mouth/Throat:     Pharynx: No oropharyngeal exudate.  Eyes:     Pupils: Pupils are equal, round, and reactive to light.  Cardiovascular:     Rate and Rhythm: Normal rate and regular rhythm.  Pulmonary:     Effort: Pulmonary effort is normal. No respiratory distress.     Breath sounds: Normal breath sounds. No wheezing.  Abdominal:     General: Bowel sounds are normal. There is no distension.     Palpations: Abdomen is soft. There is no mass.     Tenderness: no abdominal tenderness There is no guarding or rebound.  Musculoskeletal:        General: No tenderness. Normal range of motion.     Cervical back: Normal range of motion and neck supple.  Skin:    General: Skin is warm.  Neurological:     Mental Status: She is alert and oriented to person, place, and time.  Psychiatric:        Mood and  Affect: Affect normal.    LABORATORY DATA:  I have reviewed the data as listed Lab Results  Component Value Date   WBC 7.0 07/10/2020   HGB 8.6 (L) 07/10/2020   HCT 26.8 (L) 07/10/2020   MCV 87.3 07/10/2020   PLT 294 07/10/2020   Recent Labs    09/04/19 1249 11/06/19 1235 05/08/20 0437 05/09/20 0750 07/10/20 1325  NA 135   < > 138 137 134*  K 4.3   < > 3.8 4.0 4.7  CL 103   < > 107 107 103  CO2 21*   < > _0 GLUCOSE 450*   < > 184* 148* 285*  BUN 25*   < > 22 17 41*  CREATININE 1.34*   < > 0.98 0.94 1.91*  CALCIUM 8.3*   < > 8.7* 8.6* 9.1  GFRNONAA 39*   < > >60 >60 27*  GFRAA 45*  --   --   --   --    < > = values in this interval not displayed.     Intravitreal Injection, Pharmacologic Agent - OD - Right Eye  Result Date: 06/17/2020 Time Out 06/17/2020. 3:33 PM. Confirmed correct patient, procedure, site, and patient consented. Anesthesia Topical anesthesia was used. Anesthetic medications included Lidocaine 2%, Proparacaine  0.5%. Procedure Preparation included 5% betadine to ocular surface, eyelid speculum. A (32g) needle was used. Injection: 2 mg aflibercept 2 MG/0.05ML   Route: Intravitreal, Site: Right Eye   NDC: [id number], Lot: [id number], Expiration date: 02/03/2021, Waste: 0.05 mL Post-op Post injection exam found visual acuity of at least counting fingers. The patient tolerated the procedure well. There were no complications. The patient received written and verbal post procedure care education. Post injection medications were not given.   OCT, Retina - OU - Both Eyes  Result Date: 06/17/2020 Right Eye Quality was good. Central Foveal Thickness: 334. Progression has improved. Findings include abnormal foveal contour, epiretinal membrane, no SRF, intraretinal fluid (Interval improvement in IRF/cystic changes IT fovea). Left Eye Quality was good. Central Foveal Thickness: 286. Progression has been stable. Findings include normal foveal contour, no IRF, no SRF  (Trace ERM). Notes *Images captured and stored on drive Diagnosis / Impression: OD: BRVO -- Interval improvement in IRF/cystic changes IT fovea OS: NFP; no IRF/SRF--stable, trace ERM Clinical management: See below Abbreviations: NFP - Normal foveal profile. CME - cystoid macular edema. PED - pigment epithelial detachment. IRF - intraretinal fluid. SRF - subretinal fluid. EZ - ellipsoid zone. ERM - epiretinal membrane. ORA - outer retinal atrophy. ORT - outer retinal tubulation. 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  Improved s/p IV iron infusions however today- Hb 8.6 . Proceed with Venofer today.    # FEB 2022- Iron saturation 12%; ferritin 14-anemia secondary to CKD.  Proceed with iron infusions as planned.  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   # Etiology-CKD-III; GFR ~50-60 overall -stable [Dr.Kolluru]; recommend continued hydration.  #Poor IV access/Mediport placement-stable  pic # DISPOSITION:  # Venofer today;  # in 2 weeks from now-Venofer weekly x-2 # follow up 3 months- MD; port; labs- cbc/bmp;possible venofer- Dr.B  All questions were answered. The patient knows to call the clinic with any problems, questions or concerns.    Cammie Sickle, MD 07/12/2020 8:51 PM

## 2020-07-10 NOTE — Patient Instructions (Signed)

## 2020-07-10 NOTE — Assessment & Plan Note (Addendum)
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  Improved s/p IV iron infusions however today- Hb 8.6 . Proceed with Venofer today.    # FEB 2022- Iron saturation 12%; ferritin 14-anemia secondary to CKD.  Proceed with iron infusions as planned.  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   # Etiology-CKD-III; GFR ~50-60 overall -stable [Dr.Kolluru]; recommend continued hydration.  #Poor IV access/Mediport placement-stable  pic # DISPOSITION:  # Venofer today;  # in 2 weeks from now-Venofer weekly x-2 # follow up 3 months- MD; port; labs- cbc/bmp;possible venofer- Dr.B

## 2020-07-12 ENCOUNTER — Encounter: Payer: Self-pay | Admitting: Internal Medicine

## 2020-07-13 ENCOUNTER — Telehealth: Payer: Self-pay | Admitting: Internal Medicine

## 2020-07-13 NOTE — Telephone Encounter (Signed)
Patient left vm stating she soul not be able to come for her 7/22 infusion as she will be out of town.  I left a voicemail requesting a return call to reschedule.

## 2020-07-14 NOTE — Progress Notes (Signed)
[facility] Note  07/15/2020     CHIEF COMPLAINT Patient presents for Retina Follow Up   HISTORY OF PRESENT ILLNESS: Cheryl Leonard is a 75 y.o. female who presents to the clinic today for:   HPI     Retina Follow Up   Patient presents with  CRVO/BRVO.  In right eye.  Duration of 4 weeks.  Since onset it is stable.  I, the attending physician,  performed the HPI with the patient and updated documentation appropriately.        Comments   Pt here for 4 wk ret f/u BRVO OD. Pt states no changes, no issues ocular wise. She reports just finishing up antibiotics for foot infection. Blood sugar this am was 132, most recent A1C level was 7.       Last edited by Bernarda Caffey, MD on 07/15/2020  5:20 PM.      Referring physician: Rusty Aus, [place],  [place] [address]  HISTORICAL INFORMATION:   Selected notes from the MEDICAL RECORD NUMBER Referred by Dr. Marvel Plan for concern of DME OD Lab Results  Component Value Date   HGBA1C 6.3 (H) 05/04/2020       CURRENT MEDICATIONS: No current outpatient medications on file. (Ophthalmic Drugs)   No current facility-administered medications for this visit. (Ophthalmic Drugs)   Current Outpatient Medications (Other)  Medication Sig   ALPRAZolam (XANAX) 0.25 MG tablet Take 0.25 mg by mouth daily as needed for anxiety or sleep.    amLODipine (NORVASC) 5 MG tablet Take 5 mg by mouth daily.   aspirin EC 81 MG tablet Take 81 mg by mouth daily.    cholecalciferol (VITAMIN D) 1000 units tablet Take 1,000 Units by mouth 2 (two) times daily.   CORAL CALCIUM PO Take 1 tablet by mouth 2 (two) times daily.    denosumab (PROLIA) 60 MG/ML SOLN injection Inject 60 mg into the skin every 6 (six) months.    DULoxetine (CYMBALTA) 30 MG capsule Take 30 mg by mouth daily.   estradiol (ESTRACE) 0.1 MG/GM vaginal cream Apply one pea-sized amount around the  opening of the urethra daily for two weeks, then three times weekly thereafter.   famotidine (PEPCID) 40 MG tablet Take 40 mg by mouth daily.   furosemide (LASIX) 20 MG tablet Take 20 mg by mouth daily as needed for fluid or edema.    gabapentin (NEURONTIN) 100 MG capsule Take 1 capsule by mouth 2 (two) times daily.   Insulin Pen Needle 31G X 5 MM MISC once daily.   levothyroxine (SYNTHROID, LEVOTHROID) 100 MCG tablet Take 100 mcg by mouth daily before breakfast.    lidocaine-prilocaine (EMLA) cream Apply 1 application topically as needed (apply prior to port a cath access).   Magnesium 500 MG TABS Take 500 mg by mouth every morning.    metFORMIN (GLUCOPHAGE) 1000 MG tablet Take 1 tablet by mouth 2 (two) times daily with a meal.   metoprolol succinate (TOPROL-XL) 50 MG 24 hr tablet Take 50 mg by mouth daily.   mirabegron ER (MYRBETRIQ) 50 MG TB24 tablet Take 1 tablet (50 mg total) by mouth daily.   mirtazapine (REMERON) 15 MG tablet Take 15 mg by mouth at bedtime.   Multiple Vitamin (MULTIVITAMIN WITH MINERALS) TABS tablet Take 1 tablet by mouth daily.   nystatin cream (MYCOSTATIN) Apply 1 application topically 2 (two) times daily.   olmesartan (BENICAR) 20 MG tablet  Take 20 mg by mouth daily.   pantoprazole (PROTONIX) 40 MG tablet Take 40 mg by mouth every morning.    rivaroxaban (XARELTO) 20 MG TABS tablet Take 1 tablet (20 mg total) by mouth daily with supper.   rosuvastatin (CRESTOR) 20 MG tablet Take 20 mg by mouth every morning.    TRESIBA FLEXTOUCH 200 UNIT/ML SOPN Inject 30 Units as directed at bedtime.    vitamin B-12 (CYANOCOBALAMIN) 100 MCG tablet Take 100 mcg by mouth daily.   vitamin E 400 UNIT capsule Take 400 Units by mouth daily.   zolpidem (AMBIEN) 5 MG tablet Take 1 tablet (5 mg total) by mouth at bedtime as needed for sleep.   No current facility-administered medications for this visit. (Other)      REVIEW OF SYSTEMS: ROS   Positive for: Neurological, Genitourinary,  Musculoskeletal, Endocrine, Eyes Negative for: Constitutional, Gastrointestinal, Skin, HENT, Cardiovascular, Respiratory, Psychiatric, Allergic/Imm, Heme/Lymph Last edited by Kingsley Spittle, COT on 07/15/2020  2:30 PM.       ALLERGIES Allergies  Allergen Reactions   Ace Inhibitors Other (See Comments)    PAST MEDICAL HISTORY Past Medical History:  Diagnosis Date   Anemia    Anxiety    Arthritis    Gout   Cataracts, both eyes    Diabetic retinopathy ([place])    NPDR OU   Diabetic retinopathy ([place])    GERD (gastroesophageal reflux disease)    Gout    Headache    h/o migraines   History of fracture of patella    right knee   History of positive PPD    Patient always shows positive   Hyperlipidemia    Hypertension    Hypertensive retinopathy    OU   Hypothyroidism    Lichen sclerosus 15/18/3437   of vulva   Metatarsal fracture    Neuropathy    Peripheral vascular disease (HCC)    Polyneuropathy    numbness and tingling in feet and toes   Renal insufficiency    Stage 3   Sleep apnea    does not use cpap-lost weight    Type 2 diabetes mellitus, uncontrolled ([place])    Past Surgical History:  Procedure Laterality Date   ABDOMINAL HYSTERECTOMY     AMPUTATION TOE Right 05/08/2020   Procedure: AMPUTATION TOE-Right 4th Toe;  Surgeon: Caroline More, DPM;  Location: [facility];  Service: Podiatry;  Laterality: Right;   APPENDECTOMY     BREAST REDUCTION SURGERY  2001   CATARACT EXTRACTION     CESAREAN SECTION  1976   COLONOSCOPY  03/05/2013   Nml - due for repeat 03/06/2018   COLONOSCOPY WITH PROPOFOL N/A 03/18/2019   Procedure: COLONOSCOPY WITH PROPOFOL;  Surgeon: Toledo, Benay Pike, MD;  Location: [facility];  Service: Gastroenterology;  Laterality: N/A;   DIAGNOSTIC LAPAROSCOPY     DILATION AND CURETTAGE OF UTERUS  1989   ENDARTERECTOMY FEMORAL Bilateral 03/09/2018   Procedure: ENDARTERECTOMY FEMORAL;  Surgeon: Algernon Huxley, MD;  Location: [facility];  Service: Vascular;   Laterality: Bilateral;   ENDARTERECTOMY POPLITEAL Left 03/09/2018   Procedure: ENDARTERECTOMY POPLITEAL AND SFA;  Surgeon: Algernon Huxley, MD;  Location: [facility];  Service: Vascular;  Laterality: Left;   ESOPHAGOGASTRODUODENOSCOPY  03/05/2013   ESOPHAGOGASTRODUODENOSCOPY (EGD) WITH PROPOFOL N/A 03/18/2019   Procedure: ESOPHAGOGASTRODUODENOSCOPY (EGD) WITH PROPOFOL;  Surgeon: Toledo, Benay Pike, MD;  Location: [facility];  Service: Gastroenterology;  Laterality: N/A;   EYE SURGERY     Eyelid Surgery  2012  INTRAMEDULLARY (IM) NAIL INTERTROCHANTERIC Left 10/30/2015   Procedure: INTRAMEDULLARY (IM) NAIL INTERTROCHANTRIC ;  Surgeon: Hessie Knows, MD;  Location: [facility];  Service: Orthopedics;  Laterality: Left;   KYPHOPLASTY N/A 10/25/2018   Procedure: L4 KYPHOPLASTY;  Surgeon: Hessie Knows, MD;  Location: [facility];  Service: Orthopedics;  Laterality: N/A;   LAPAROSCOPIC HYSTERECTOMY  2000   total   LOWER EXTREMITY ANGIOGRAPHY Left 03/08/2017   Procedure: LOWER EXTREMITY ANGIOGRAPHY;  Surgeon: Algernon Huxley, MD;  Location: [place] [facility];  Service: Cardiovascular;  Laterality: Left;   LOWER EXTREMITY ANGIOGRAPHY Left 10/30/2017   Procedure: LOWER EXTREMITY ANGIOGRAPHY;  Surgeon: Algernon Huxley, MD;  Location: [place] [facility];  Service: Cardiovascular;  Laterality: Left;   LOWER EXTREMITY ANGIOGRAPHY Right 03/08/2018   Procedure: LOWER EXTREMITY ANGIOGRAPHY;  Surgeon: Algernon Huxley, MD;  Location: [place] [facility];  Service: Cardiovascular;  Laterality: Right;   LOWER EXTREMITY ANGIOGRAPHY Left 10/01/2018   Procedure: LOWER EXTREMITY ANGIOGRAPHY;  Surgeon: Algernon Huxley, MD;  Location: [place] [facility];  Service: Cardiovascular;  Laterality: Left;   LOWER EXTREMITY ANGIOGRAPHY Right 10/08/2018   Procedure: LOWER EXTREMITY ANGIOGRAPHY;  Surgeon: Algernon Huxley, MD;  Location: [place] [facility];  Service: Cardiovascular;  Laterality: Right;   LOWER EXTREMITY ANGIOGRAPHY Right 05/07/2020    Procedure: Lower Extremity Angiography;  Surgeon: Algernon Huxley, MD;  Location: [place] [facility];  Service: Cardiovascular;  Laterality: Right;   PERIPHERAL VASCULAR INTERVENTION  03/08/2018   Procedure: PERIPHERAL VASCULAR INTERVENTION;  Surgeon: Algernon Huxley, MD;  Location: [place] [facility];  Service: Cardiovascular;;   PORTA CATH INSERTION N/A 02/17/2020   Procedure: PORTA CATH INSERTION;  Surgeon: Algernon Huxley, MD;  Location: [place] [facility];  Service: Cardiovascular;  Laterality: N/A;   REDUCTION MAMMAPLASTY  1997   SACROPLASTY N/A 10/25/2018   Procedure: S1 SACROPLASTY;  Surgeon: Hessie Knows, MD;  Location: [facility];  Service: Orthopedics;  Laterality: N/A;    FAMILY HISTORY Family History  Problem Relation Age of Onset   Coronary artery disease Father    Heart attack Father    Coronary artery disease Mother    Heart attack Mother    Ovarian cancer Sister 41       sister had hormonal therapy for IVF txs-which increased risk factor for ovarian cancer   Breast cancer Neg Hx     SOCIAL HISTORY Social History   Tobacco Use   Smoking status: Former    Packs/day: 1.00    Years: 20.00    Pack years: 20.00    Types: Cigarettes    Quit date: 03/07/1996    Years since quitting: 24.3   Smokeless tobacco: Never   Tobacco comments:    started smoking at age 35  Vaping Use   Vaping Use: Never used  Substance Use Topics   Alcohol use: No    Alcohol/week: 0.0 standard drinks   Drug use: No         OPHTHALMIC EXAM:  Base Eye Exam     Visual Acuity (Snellen - Linear)       Right Left   Dist Conception 20/25 20/20 -1   Dist ph  NI          Tonometry (Tonopen, 2:36 PM)       Right Left   Pressure 14 14         Pupils       Dark Light Shape React APD  Right 3 2 Round Minimal None   Left 3 2 Round Minimal None         Visual Fields (Counting fingers)       Left Right    Full Full         Extraocular Movement       Right Left    Full, Ortho  Full, Ortho         Neuro/Psych     Oriented x3: Yes   Mood/Affect: Normal         Dilation     Both eyes: 1.0% Mydriacyl, 2.5% Phenylephrine @ 2:37 PM           Slit Lamp and Fundus Exam     External Exam       Right Left   External Normal Normal         Slit Lamp Exam       Right Left   Lids/Lashes dermatochalasis dermatochalasis   Conjunctiva/Sclera White and quiet White and quiet   Cornea arcus; well healed cataract wound; 2-3+ diffuse Punctate epithelial erosions, decreased TBUT, mild Anterior basement membrane dystrophy superiorly arcus; well healed cataract wound, 2-3+ diffuse Punctate epithelial erosions, irregualr epi surface, decreased TBUT   Anterior Chamber Deep and quiet Deep and quiet   Iris Round and dilated Round and dilated   Lens PCIOL; open PC PCIOL; open PC   Vitreous syneresis, Posterior vitreous detachment, vitreous condensations inferiorly syneresis, Posterior vitreous detachment         Fundus Exam       Right Left   Disc Superior hyperemia and mild edema - improved, mild Pallor Pink and Sharp   C/D Ratio 0.5 0.5   Macula Flat, Blunted foveal reflex, trace cystic changes slightly improved, +Epiretinal membrane, no heme flat; good foveal reflex, no heme or edema, small pigment clump IT to fovea   Vessels attenuated, Tortuous Vascular attenuation   Periphery Attached; focal dot heme temporal periphery Attached, no heme            IMAGING AND PROCEDURES  Imaging and Procedures for 04/25/17  OCT, Retina - OU - Both Eyes       Right Eye Quality was good. Central Foveal Thickness: 333. Progression has been stable. Findings include abnormal foveal contour, epiretinal membrane, no SRF, intraretinal fluid (Trace persistent cystic changes IT fovea).   Left Eye Quality was good. Central Foveal Thickness: 286. Progression has been stable. Findings include normal foveal contour, no IRF, no SRF (Trace ERM).   Notes *Images captured  and stored on drive  Diagnosis / Impression:  OD: BRVO -- Trace persistent cystic changes IT fovea OS: NFP; no IRF/SRF--stable, trace ERM  Clinical management:  See below  Abbreviations: NFP - Normal foveal profile. CME - cystoid macular edema. PED - pigment epithelial detachment. IRF - intraretinal fluid. SRF - subretinal fluid. EZ - ellipsoid zone. ERM - epiretinal membrane. ORA - outer retinal atrophy. ORT - outer retinal tubulation. SRHM - subretinal hyper-reflective material      Intravitreal Injection, Pharmacologic Agent - OD - Right Eye       Time Out 07/15/2020. 3:00 PM. Confirmed correct patient, procedure, site, and patient consented.   Anesthesia Topical anesthesia was used. Anesthetic medications included Lidocaine 2%, Proparacaine 0.5%.   Procedure Preparation included 5% betadine to ocular surface, eyelid speculum. A (32g) needle was used.   Injection: 2 mg aflibercept 2 MG/0.05ML   Route: Intravitreal, Site: Right Eye   NDC: [id number], Lot: [id number], Expiration  date: 05/02/2021, Waste: 0.05 mL   Post-op Post injection exam found visual acuity of at least counting fingers. The patient tolerated the procedure well. There were no complications. The patient received written and verbal post procedure care education. Post injection medications were not given.            ASSESSMENT/PLAN:    ICD-10-CM   1. Branch retinal vein occlusion of right eye with macular edema  H34.8310 Intravitreal Injection, Pharmacologic Agent - OD - Right Eye    aflibercept (EYLEA) SOLN 2 mg    2. Retinal edema  H35.81 OCT, Retina - OU - Both Eyes    3. Both eyes affected by mild nonproliferative diabetic retinopathy with macular edema, associated with type 2 diabetes mellitus ([place])  Q67.6195     4. Essential hypertension  I10     5. Hypertensive retinopathy of both eyes  H35.033     6. Epiretinal membrane (ERM) of right eye  H35.371     7. Pseudophakia of both eyes  Z96.1        1,2. BRVO w/ CME OD  - by history, pt states symptoms first noticed 2 wks prior to presentation, but reports changes may have occurred prior  - initial exam with differential tortuosity of vessels (OD > OS)  - FA (02.10.20) shows mild late staining / leakage in macula, staining / leakage of disc -- improving CME  - differential includes DM2 (DME), hypertensive retinopathy, inflammatory etiology / uveitis  - S/P IVA OD #1 (02.08.19), #2 (03.11.19), #3 (04.09.19), #4 (05.20.19), #5 (02.10.20)  - gave IVA OD on 2.10.20 due to pending Eylea4U for 2020 -- resulted in increased IRF/CME  - review of OCTs show persistent IRF and cystic changes --  resistance to IVA   - June 2019 -- switched therapies: S/P IVE OD #1 (06.24.19), #2 (07.24.19), #3 (09.04.19), #4 (10.30.19),#5 (12.30.19), #6 (03.23.20), #7 (05.05.20), #8 (07.16.20), #9 (07.17.20), #10 (08.28.20), #11 (10.13.20), # 12 (11.17.20), #13 (2.8.21), #14 (03.09.21), #15 (04.13.21), #16 (05.11.21), #17 (06.17.21), #18 (07.23.21), #19 (08.30.21), #20 (10.04.21), #21 (11.08.21), #22 (12.08.21), #23 (01.31.22), #24 (02.28.22), #25 (04.01.22), #26 (06.15.22)  - OCT today shows interval imporvement in IRF/cystic changes IT fovea at 4 weeks  - BCVA 20/25 -- improved  - Eylea4U benefits investigation completed and pt approved for IVE for 2022  - recommend IVE OD #27 today, 07.13.22 w/ f/u in 4 wks  - RBA of procedure discussed, questions answered  - informed consent obtained  - see procedure note  - Eylea informed consent form obtained and scanned on 11.19.2020  - f/u 4 weeks  -- DFE/OCT/possible injection  3. Mild nonproliferative diabetic retinopathy, both eyes  - A1c 6.3 on 5.2.22  - could be contributing to CME OD  - OS with minimal diabetic retinopathy  - continue to monitor  4,5. Hypertensive retinopathy OU - stable  - as above, may have contributing to CME OD  - discussed importance of tight BP control  - monitor  6. Epiretinal  membrane, right eye   - stable nasal ERM  - no indication for surgery at this time  7. Pseudophakia OU  - s/p CE/IOL OU by cataract surgeon in [facility]  - doing well  - monitor    Ophthalmic Meds Ordered this visit:  Meds ordered this encounter  Medications   aflibercept (EYLEA) SOLN 2 mg       Return in about 4 weeks (around 08/12/2020) for f/u BRVO OD, DFE, OCT.  This document  serves as a record of services personally performed by Gardiner Sleeper, MD, PhD. It was created on their behalf by Roselee Nova, COMT. The creation of this record is the provider's dictation and/or activities during the visit.  Electronically signed by: Roselee Nova, COMT 07/15/20 5:21 PM  This document serves as a record of services personally performed by Gardiner Sleeper, MD, PhD. It was created on their behalf by San Jetty. Owens Shark, OA an ophthalmic technician. The creation of this record is the provider's dictation and/or activities during the visit.    Electronically signed by: San Jetty. Owens Shark, [place] 07.13.2022 5:21 PM   Gardiner Sleeper, M.D., Ph.D. Diseases & Surgery of the Retina and Vitreous [facility]  I have reviewed the above documentation for accuracy and completeness, and I agree with the above. Gardiner Sleeper, M.D., Ph.D. 07/15/20 5:21 PM   Abbreviations: M myopia (nearsighted); A astigmatism; H hyperopia (farsighted); P presbyopia; Mrx spectacle prescription;  CTL contact lenses; OD right eye; OS left eye; OU both eyes  XT exotropia; ET esotropia; PEK punctate epithelial keratitis; PEE punctate epithelial erosions; DES dry eye syndrome; MGD meibomian gland dysfunction; ATs artificial tears; PFAT's preservative free artificial tears; [place] nuclear sclerotic cataract; PSC posterior subcapsular cataract; ERM epi-retinal membrane; PVD posterior vitreous detachment; RD retinal detachment; DM diabetes mellitus; DR diabetic retinopathy; NPDR non-proliferative diabetic retinopathy; PDR  proliferative diabetic retinopathy; CSME clinically significant macular edema; DME diabetic macular edema; dbh dot blot hemorrhages; CWS cotton wool spot; POAG primary open angle glaucoma; C/D cup-to-disc ratio; HVF humphrey visual field; GVF goldmann visual field; OCT optical coherence tomography; IOP intraocular pressure; BRVO Branch retinal vein occlusion; CRVO central retinal vein occlusion; CRAO central retinal artery occlusion; BRAO branch retinal artery occlusion; RT retinal tear; SB scleral buckle; PPV pars plana vitrectomy; VH Vitreous hemorrhage; PRP panretinal laser photocoagulation; IVK intravitreal kenalog; VMT vitreomacular traction; MH Macular hole;  NVD neovascularization of the disc; NVE neovascularization elsewhere; AREDS age related eye disease study; ARMD age related macular degeneration; POAG primary open angle glaucoma; EBMD epithelial/anterior basement membrane dystrophy; ACIOL anterior chamber intraocular lens; IOL intraocular lens; PCIOL posterior chamber intraocular lens; Phaco/IOL phacoemulsification with intraocular lens placement; [place] photorefractive keratectomy; LASIK laser assisted in situ keratomileusis; HTN hypertension; DM diabetes mellitus; COPD chronic obstructive pulmonary disease

## 2020-07-15 ENCOUNTER — Ambulatory Visit (INDEPENDENT_AMBULATORY_CARE_PROVIDER_SITE_OTHER): Payer: Medicare Other | Admitting: Ophthalmology

## 2020-07-15 ENCOUNTER — Other Ambulatory Visit: Payer: Self-pay

## 2020-07-15 ENCOUNTER — Encounter (INDEPENDENT_AMBULATORY_CARE_PROVIDER_SITE_OTHER): Payer: Self-pay | Admitting: Ophthalmology

## 2020-07-15 DIAGNOSIS — H3581 Retinal edema: Secondary | ICD-10-CM

## 2020-07-15 DIAGNOSIS — Z961 Presence of intraocular lens: Secondary | ICD-10-CM

## 2020-07-15 DIAGNOSIS — E113213 Type 2 diabetes mellitus with mild nonproliferative diabetic retinopathy with macular edema, bilateral: Secondary | ICD-10-CM | POA: Diagnosis not present

## 2020-07-15 DIAGNOSIS — I10 Essential (primary) hypertension: Secondary | ICD-10-CM

## 2020-07-15 DIAGNOSIS — H34831 Tributary (branch) retinal vein occlusion, right eye, with macular edema: Secondary | ICD-10-CM

## 2020-07-15 DIAGNOSIS — H35033 Hypertensive retinopathy, bilateral: Secondary | ICD-10-CM

## 2020-07-15 DIAGNOSIS — H35371 Puckering of macula, right eye: Secondary | ICD-10-CM

## 2020-07-15 MED ORDER — AFLIBERCEPT 2MG/0.05ML IZ SOLN FOR KALEIDOSCOPE
2.0000 mg | INTRAVITREAL | Status: AC | PRN
  Administered 2020-07-15: 2 mg via INTRAVITREAL

## 2020-07-24 ENCOUNTER — Ambulatory Visit: Payer: Medicare Other

## 2020-07-31 ENCOUNTER — Inpatient Hospital Stay: Payer: Medicare Other

## 2020-08-04 ENCOUNTER — Ambulatory Visit (INDEPENDENT_AMBULATORY_CARE_PROVIDER_SITE_OTHER): Payer: Medicare Other | Admitting: Physician Assistant

## 2020-08-04 ENCOUNTER — Other Ambulatory Visit: Payer: Self-pay

## 2020-08-04 ENCOUNTER — Other Ambulatory Visit: Payer: Self-pay | Admitting: Physician Assistant

## 2020-08-04 ENCOUNTER — Encounter: Payer: Self-pay | Admitting: Physician Assistant

## 2020-08-04 VITALS — BP 139/64 | HR 77 | Ht 63.0 in | Wt 165.0 lb

## 2020-08-04 DIAGNOSIS — R300 Dysuria: Secondary | ICD-10-CM

## 2020-08-04 DIAGNOSIS — N3941 Urge incontinence: Secondary | ICD-10-CM

## 2020-08-04 LAB — URINALYSIS, COMPLETE
Bilirubin, UA: NEGATIVE
Glucose, UA: NEGATIVE
Ketones, UA: NEGATIVE
Leukocytes,UA: NEGATIVE
Nitrite, UA: POSITIVE — AB
RBC, UA: NEGATIVE
Specific Gravity, UA: 1.02 (ref 1.005–1.030)
Urobilinogen, Ur: 0.2 mg/dL (ref 0.2–1.0)
pH, UA: 5 (ref 5.0–7.5)

## 2020-08-04 LAB — MICROSCOPIC EXAMINATION

## 2020-08-04 LAB — BLADDER SCAN AMB NON-IMAGING

## 2020-08-04 MED ORDER — MIRABEGRON ER 50 MG PO TB24: 50.0000 mg | ORAL_TABLET | Freq: Every day | ORAL | 11 refills | Status: DC

## 2020-08-04 NOTE — Progress Notes (Signed)
08/04/2020 10:34 AM   Cheryl Leonard 02/14/1945 [id number]  CC: Chief Complaint  Patient presents with   Urinary Incontinence   HPI: Cheryl PurserVeronica S Leonard is a 75 y.o. female with PMH recurrent UTI on probiotics, cranberry supplements, and topical vaginal estrogen cream; OSA; and OAB wet previously on Myrbetriq 50 mg who presents today for evaluation of urge incontinence.   I saw her in clinic most recently on 04/27/2020.  At that time, a recent CT abdomen pelvis with contrast revealed gas in the bladder in the absence of recent instrumentation or infection.  I recommended that she undergo cystoscopy for evaluation of possible colovesical versus colovaginal fistula, however she no showed this appointment.  She was seen by her PCP 6 days ago for evaluation of increased fatigue and moodiness.  UA was notable for pyuria and bacteriuria and she was started on empiric Macrobid.  Her urine culture has since resulted with ampicillin resistant Klebsiella pneumoniae.  Today she reports ongoing bothersome urgency with urge incontinence.  She wears Depends all the time.  She continues to use vaginal estrogen cream but states she is no longer taking Myrbetriq.  She denies dysuria last week when seeing her PCP but reports some intermittent low belly and low back pain.  She states she is having dysuria today.  In-office UA today positive for 2+ blood and nitrites; urine microscopy with 11-30 WBCs/HPF, granular casts, and many bacteria.  PMH: Past Medical History:  Diagnosis Date   Anemia    Anxiety    Arthritis    Gout   Cataracts, both eyes    Diabetic retinopathy (HCC)    NPDR OU   Diabetic retinopathy (HCC)    GERD (gastroesophageal reflux disease)    Gout    Headache    h/o migraines   History of fracture of patella    right knee   History of positive PPD    Patient always shows positive   Hyperlipidemia    Hypertension    Hypertensive retinopathy    OU   Hypothyroidism     Lichen sclerosus 12/30/2013   of vulva   Metatarsal fracture    Neuropathy    Peripheral vascular disease (HCC)    Polyneuropathy    numbness and tingling in feet and toes   Renal insufficiency    Stage 3   Sleep apnea    does not use cpap-lost weight    Type 2 diabetes mellitus, uncontrolled (HCC)     Surgical History: Past Surgical History:  Procedure Laterality Date   ABDOMINAL HYSTERECTOMY     AMPUTATION TOE Right 05/08/2020   Procedure: AMPUTATION TOE-Right 4th Toe;  Surgeon: Rosetta PosnerBaker, Andrew, DPM;  Location: [facility];  Service: Podiatry;  Laterality: Right;   APPENDECTOMY     BREAST REDUCTION SURGERY  2001   CATARACT EXTRACTION     CESAREAN SECTION  1976   COLONOSCOPY  03/05/2013   Nml - due for repeat 03/06/2018   COLONOSCOPY WITH PROPOFOL N/A 03/18/2019   Procedure: COLONOSCOPY WITH PROPOFOL;  Surgeon: Toledo, Boykin Nearingeodoro K, MD;  Location: [facility];  Service: Gastroenterology;  Laterality: N/A;   DIAGNOSTIC LAPAROSCOPY     DILATION AND CURETTAGE OF UTERUS  1989   ENDARTERECTOMY FEMORAL Bilateral 03/09/2018   Procedure: ENDARTERECTOMY FEMORAL;  Surgeon: Annice Needyew, Jason S, MD;  Location: [facility];  Service: Vascular;  Laterality: Bilateral;   ENDARTERECTOMY POPLITEAL Left 03/09/2018   Procedure: ENDARTERECTOMY POPLITEAL AND SFA;  Surgeon: Annice Needyew, Jason S, MD;  [facility];  Service: Vascular;  Laterality: Left;   ESOPHAGOGASTRODUODENOSCOPY  03/05/2013   ESOPHAGOGASTRODUODENOSCOPY (EGD) WITH PROPOFOL N/A 03/18/2019   Procedure: ESOPHAGOGASTRODUODENOSCOPY (EGD) WITH PROPOFOL;  Surgeon: Toledo, [name], MD;  Location: [facility];  Service: Gastroenterology;  Laterality: N/A;   EYE SURGERY     Eyelid Surgery  2012   INTRAMEDULLARY (IM) NAIL INTERTROCHANTERIC Left 10/30/2015   Procedure: INTRAMEDULLARY (IM) NAIL INTERTROCHANTRIC ;  Surgeon: Kennedy Bucker, MD;  Location: [facility];  Service: Orthopedics;  Laterality: Left;   KYPHOPLASTY N/A 10/25/2018   Procedure: L4 KYPHOPLASTY;   Surgeon: Kennedy Bucker, MD;  Location: [facility];  Service: Orthopedics;  Laterality: N/A;   LAPAROSCOPIC HYSTERECTOMY  2000   total   LOWER EXTREMITY ANGIOGRAPHY Left 03/08/2017   Procedure: LOWER EXTREMITY ANGIOGRAPHY;  Surgeon: Annice Needy, MD;  Location: [facility];  Service: Cardiovascular;  Laterality: Left;   LOWER EXTREMITY ANGIOGRAPHY Left 10/30/2017   Procedure: LOWER EXTREMITY ANGIOGRAPHY;  Surgeon: Annice Needy, MD;  Location: [facility];  Service: Cardiovascular;  Laterality: Left;   LOWER EXTREMITY ANGIOGRAPHY Right 03/08/2018   Procedure: LOWER EXTREMITY ANGIOGRAPHY;  Surgeon: Annice Needy, MD;  Location: [facility];  Service: Cardiovascular;  Laterality: Right;   LOWER EXTREMITY ANGIOGRAPHY Left 10/01/2018   Procedure: LOWER EXTREMITY ANGIOGRAPHY;  Surgeon: Annice Needy, MD;  Location: [facility];  Service: Cardiovascular;  Laterality: Left;   LOWER EXTREMITY ANGIOGRAPHY Right 10/08/2018   Procedure: LOWER EXTREMITY ANGIOGRAPHY;  Surgeon: Annice Needy, MD;  Location: [facility];  Service: Cardiovascular;  Laterality: Right;   LOWER EXTREMITY ANGIOGRAPHY Right 05/07/2020   Procedure: Lower Extremity Angiography;  Surgeon: Annice Needy, MD;  Location: [facility];  Service: Cardiovascular;  Laterality: Right;   PERIPHERAL VASCULAR INTERVENTION  03/08/2018   Procedure: PERIPHERAL VASCULAR INTERVENTION;  Surgeon: Annice Needy, MD;  Location: [facility];  Service: Cardiovascular;;   PORTA CATH INSERTION N/A 02/17/2020   Procedure: PORTA CATH INSERTION;  Surgeon: Annice Needy, MD;  Location: [facility];  Service: Cardiovascular;  Laterality: N/A;   REDUCTION MAMMAPLASTY  1997   SACROPLASTY N/A 10/25/2018   Procedure: S1 SACROPLASTY;  Surgeon: Kennedy Bucker, MD;  Location: [facility];  Service: Orthopedics;  Laterality: N/A;    Home Medications:  Allergies as of 08/04/2020       Reactions   Ace Inhibitors Other (See  Comments)        Medication List        Accurate as of August 04, 2020 10:34 AM. If you have any questions, ask your nurse or doctor.          ALPRAZolam 0.25 MG tablet Commonly known as: XANAX Take 0.25 mg by mouth daily as needed for anxiety or sleep.   amLODipine 5 MG tablet Commonly known as: NORVASC Take 5 mg by mouth daily.   aspirin EC 81 MG tablet Take 81 mg by mouth daily.   cholecalciferol 1000 units tablet Commonly known as: VITAMIN D Take 1,000 Units by mouth 2 (two) times daily.   CORAL CALCIUM PO Take 1 tablet by mouth 2 (two) times daily.   denosumab 60 MG/ML Soln injection Commonly known as: PROLIA Inject 60 mg into the skin every 6 (six) months.   DULoxetine 30 MG capsule Commonly known as: CYMBALTA Take 30 mg by mouth daily.   escitalopram 10 MG tablet Commonly known as: LEXAPRO Take 10 mg by mouth  daily.   estradiol 0.1 MG/GM vaginal cream Commonly known as: ESTRACE Apply one pea-sized amount around the opening of the urethra daily for two weeks, then three times weekly thereafter.   famotidine 40 MG tablet Commonly known as: PEPCID Take 40 mg by mouth daily.   furosemide 20 MG tablet Commonly known as: LASIX Take 20 mg by mouth daily as needed for fluid or edema.   gabapentin 100 MG capsule Commonly known as: NEURONTIN Take 1 capsule by mouth 2 (two) times daily.   Insulin Pen Needle 31G X 5 MM Misc once daily.   levothyroxine 100 MCG tablet Commonly known as: SYNTHROID Take 100 mcg by mouth daily before breakfast.   lidocaine-prilocaine cream Commonly known as: EMLA Apply 1 application topically as needed (apply prior to port a cath access).   Magnesium 500 MG Tabs Take 500 mg by mouth every morning.   metFORMIN 1000 MG tablet Commonly known as: GLUCOPHAGE Take 1 tablet by mouth 2 (two) times daily with a meal.   metoprolol succinate 50 MG 24 hr tablet Commonly known as: TOPROL-XL Take 50 mg by mouth daily.    mirabegron ER 50 MG Tb24 tablet Commonly known as: MYRBETRIQ Take 1 tablet (50 mg total) by mouth daily.   mirtazapine 15 MG tablet Commonly known as: REMERON Take 15 mg by mouth at bedtime.   multivitamin with minerals Tabs tablet Take 1 tablet by mouth daily.   nitrofurantoin (macrocrystal-monohydrate) 100 MG capsule Commonly known as: MACROBID Take 100 mg by mouth 2 (two) times daily.   nystatin cream Commonly known as: MYCOSTATIN Apply 1 application topically 2 (two) times daily.   olmesartan 20 MG tablet Commonly known as: BENICAR Take 20 mg by mouth daily.   pantoprazole 40 MG tablet Commonly known as: PROTONIX Take 40 mg by mouth every morning.   rivaroxaban 20 MG Tabs tablet Commonly known as: XARELTO Take 1 tablet (20 mg total) by mouth daily with supper.   rosuvastatin 20 MG tablet Commonly known as: CRESTOR Take 20 mg by mouth every morning.   Evaristo Bury FlexTouch 200 UNIT/ML FlexTouch Pen Generic drug: insulin degludec Inject 30 Units as directed at bedtime.   vitamin B-12 100 MCG tablet Commonly known as: CYANOCOBALAMIN Take 100 mcg by mouth daily.   vitamin E 180 MG (400 UNITS) capsule Take 400 Units by mouth daily.   zolpidem 5 MG tablet Commonly known as: AMBIEN Take 1 tablet (5 mg total) by mouth at bedtime as needed for sleep.        Allergies:  Allergies  Allergen Reactions   Ace Inhibitors Other (See Comments)    Family History: Family History  Problem Relation Age of Onset   Coronary artery disease Father    Heart attack Father    Coronary artery disease Mother    Heart attack Mother    Ovarian cancer Sister 75       sister had hormonal therapy for IVF txs-which increased risk factor for ovarian cancer   Breast cancer Neg Hx     Social History:   reports that she quit smoking about 24 years ago. Her smoking use included cigarettes. She has a 20.00 pack-year smoking history. She has never used smokeless tobacco. She reports  that she does not drink alcohol and does not use drugs.  Physical Exam: BP 139/64   Pulse 77   Ht 5\' 3"  (1.6 m)   Wt 165 lb (74.8 kg)   BMI 29.23 kg/m   Constitutional:  Alert  and oriented, no acute distress, nontoxic appearing HEENT: [place], AT Cardiovascular: No clubbing, cyanosis, or edema Respiratory: Normal respiratory effort, no increased work of breathing Skin: No rashes, bruises or suspicious lesions Neurologic: Grossly intact, no focal deficits, moving all 4 extremities Psychiatric: Normal mood and affect  Laboratory Data: Results for orders placed or performed in visit on 08/04/20  Microscopic Examination   Urine  Result Value Ref Range   WBC, UA 11-30 (A) 0 - 5 /hpf   RBC 0-2 0 - 2 /hpf   Epithelial Cells (non renal) 0-10 0 - 10 /hpf   Casts Present (A) None seen /lpf   Cast Type Granular casts (A) N/A   Bacteria, UA Many (A) None seen/Few  Urinalysis, Complete  Result Value Ref Range   Specific Gravity, UA 1.020 1.005 - 1.030   pH, UA 5.0 5.0 - 7.5   Color, UA Yellow Yellow   Appearance Ur Cloudy (A) Clear   Leukocytes,UA Negative Negative   Protein,UA 2+ (A) Negative/Trace   Glucose, UA Negative Negative   Ketones, UA Negative Negative   RBC, UA Negative Negative   Bilirubin, UA Negative Negative   Urobilinogen, Ur 0.2 0.2 - 1.0 mg/dL   Nitrite, UA Positive (A) Negative   Microscopic Examination See below:   Bladder Scan (Post Void Residual) in office  Result Value Ref Range   Scan Result 70mL    Assessment & Plan:   1. Urge incontinence of urine Previously improved on Myrbetriq, no longer on this medication for unclear reasons.  We will restart and sent prescription to her pharmacy. - Bladder Scan (Post Void Residual) in office - mirabegron ER (MYRBETRIQ) 50 MG TB24 tablet; Take 1 tablet (50 mg total) by mouth daily.  Dispense: 30 tablet; Refill: 11  2. Dysuria Urine remains grossly infected appearing despite culture appropriate antibiotics.  I  reiterated my previous concerns for possible colovesical fistula and encouraged her to follow-up with cystoscopy, to which she agreed.  I encouraged her to continue Macrobid as prescribed and we will call her with her culture results when they become available.  We will plan to treat as indicated given plans for upcoming instrumentation. - Urinalysis, Complete - CULTURE, URINE COMPREHENSIVE  Return in about 2 weeks (around 08/18/2020) for Cysto with Dr. Lonna Cobb, will call with culture results.  Carman Ching, PA-C  [facility] [address] [place], [place] [address] [phone]

## 2020-08-06 ENCOUNTER — Inpatient Hospital Stay: Payer: Medicare Other | Attending: Internal Medicine

## 2020-08-06 ENCOUNTER — Other Ambulatory Visit: Payer: Self-pay

## 2020-08-06 ENCOUNTER — Other Ambulatory Visit: Payer: Self-pay | Admitting: Physician Assistant

## 2020-08-06 ENCOUNTER — Other Ambulatory Visit (INDEPENDENT_AMBULATORY_CARE_PROVIDER_SITE_OTHER): Payer: Self-pay | Admitting: Nurse Practitioner

## 2020-08-06 VITALS — BP 139/53 | HR 70 | Temp 98.2°F | Resp 18

## 2020-08-06 DIAGNOSIS — D631 Anemia in chronic kidney disease: Secondary | ICD-10-CM | POA: Diagnosis present

## 2020-08-06 DIAGNOSIS — N183 Chronic kidney disease, stage 3 unspecified: Secondary | ICD-10-CM

## 2020-08-06 DIAGNOSIS — N189 Chronic kidney disease, unspecified: Secondary | ICD-10-CM | POA: Insufficient documentation

## 2020-08-06 DIAGNOSIS — I129 Hypertensive chronic kidney disease with stage 1 through stage 4 chronic kidney disease, or unspecified chronic kidney disease: Secondary | ICD-10-CM | POA: Diagnosis present

## 2020-08-06 DIAGNOSIS — Z79899 Other long term (current) drug therapy: Secondary | ICD-10-CM | POA: Diagnosis not present

## 2020-08-06 DIAGNOSIS — Z95828 Presence of other vascular implants and grafts: Secondary | ICD-10-CM

## 2020-08-06 DIAGNOSIS — I739 Peripheral vascular disease, unspecified: Secondary | ICD-10-CM

## 2020-08-06 DIAGNOSIS — E1159 Type 2 diabetes mellitus with other circulatory complications: Secondary | ICD-10-CM

## 2020-08-06 DIAGNOSIS — N3941 Urge incontinence: Secondary | ICD-10-CM

## 2020-08-06 DIAGNOSIS — E782 Mixed hyperlipidemia: Secondary | ICD-10-CM

## 2020-08-06 LAB — CULTURE, URINE COMPREHENSIVE

## 2020-08-06 MED ORDER — SODIUM CHLORIDE 0.9 % IV SOLN
Freq: Once | INTRAVENOUS | Status: AC
  Filled 2020-08-06: qty 250

## 2020-08-06 MED ORDER — HEPARIN SOD (PORK) LOCK FLUSH 100 UNIT/ML IV SOLN
INTRAVENOUS | Status: AC
  Filled 2020-08-06: qty 5

## 2020-08-06 MED ORDER — IRON SUCROSE 20 MG/ML IV SOLN
200.0000 mg | Freq: Once | INTRAVENOUS | Status: AC
  Administered 2020-08-06: 200 mg via INTRAVENOUS
  Filled 2020-08-06: qty 10

## 2020-08-06 MED ORDER — HEPARIN SOD (PORK) LOCK FLUSH 100 UNIT/ML IV SOLN
500.0000 [IU] | Freq: Once | INTRAVENOUS | Status: AC
  Administered 2020-08-06: 500 [IU] via INTRAVENOUS
  Filled 2020-08-06: qty 5

## 2020-08-06 NOTE — Patient Instructions (Signed)
[facility] [place] [facility]  Discharge Instructions: Thank you for choosing [facility] to provide your oncology and hematology care.  If you have a lab appointment with the Cancer Center, please go directly to the Cancer Center and check in at the registration area.  Wear comfortable clothing and clothing appropriate for easy access to any Portacath or PICC line.   We strive to give you quality time with your provider. You may need to reschedule your appointment if you arrive late (15 or more minutes).  Arriving late affects you and other patients whose appointments are after yours.  Also, if you miss three or more appointments without notifying the office, you may be dismissed from the clinic at the provider's discretion.      For prescription refill requests, have your pharmacy contact our office and allow 72 hours for refills to be completed.    Today you received the following chemotherapy and/or immunotherapy agents VENOFER      To help prevent nausea and vomiting after your treatment, we encourage you to take your nausea medication as directed.  BELOW ARE SYMPTOMS THAT SHOULD BE REPORTED IMMEDIATELY: *FEVER GREATER THAN 100.4 F (38 C) OR HIGHER *CHILLS OR SWEATING *NAUSEA AND VOMITING THAT IS NOT CONTROLLED WITH YOUR NAUSEA MEDICATION *UNUSUAL SHORTNESS OF BREATH *UNUSUAL BRUISING OR BLEEDING *URINARY PROBLEMS (pain or burning when urinating, or frequent urination) *BOWEL PROBLEMS (unusual diarrhea, constipation, pain near the anus) TENDERNESS IN MOUTH AND THROAT WITH OR WITHOUT PRESENCE OF ULCERS (sore throat, sores in mouth, or a toothache) UNUSUAL RASH, SWELLING OR PAIN  UNUSUAL VAGINAL DISCHARGE OR ITCHING   Items with * indicate a potential emergency and should be followed up as soon as possible or go to the Emergency Department if any problems should occur.  Please show the CHEMOTHERAPY ALERT CARD or IMMUNOTHERAPY ALERT CARD at check-in to  the Emergency Department and triage nurse.  Should you have questions after your visit or need to cancel or reschedule your appointment, please contact [facility]  [phone] and follow the prompts.  Office hours are 8:00 a.m. to 4:30 p.m. Monday - Friday. Please note that voicemails left after 4:00 p.m. may not be returned until the following business day.  We are closed weekends and major holidays. You have access to a nurse at all times for urgent questions. Please call the main number to the clinic [phone] and follow the prompts.  For any non-urgent questions, you may also contact your provider using MyChart. We now offer e-Visits for anyone 18 and older to request care online for non-urgent symptoms. For details visit [email or web address].   Also download the MyChart app! Go to the app store, search "MyChart", open the app, select , and log in with your MyChart username and password.  Due to Covid, a mask is required upon entering the hospital/clinic. If you do not have a mask, one will be given to you upon arrival. For doctor visits, patients may have 1 support person aged 18 or older with them. For treatment visits, patients cannot have anyone with them due to current Covid guidelines and our immunocompromised population.   Iron Sucrose injection What is this medication? IRON SUCROSE (AHY ern SOO krohs) is an iron complex. Iron is used to make healthy red blood cells, which carry oxygen and nutrients throughout the body. This medicine is used to treat iron deficiency anemia in people with chronickidney disease. This medicine may be used for other   purposes; ask your health care provider orpharmacist if you have questions. COMMON BRAND NAME(S): Venofer What should I tell my care team before I take this medication? They need to know if you have any of these conditions: anemia not caused by low iron levels heart disease high levels of  iron in the blood kidney disease liver disease an unusual or allergic reaction to iron, other medicines, foods, dyes, or preservatives pregnant or trying to get pregnant breast-feeding How should I use this medication? This medicine is for infusion into a vein. It is given by a health careprofessional in a hospital or clinic setting. Talk to your pediatrician regarding the use of this medicine in children. While this drug may be prescribed for children as young as 2 years for selectedconditions, precautions do apply. Overdosage: If you think you have taken too much of this medicine contact apoison control center or emergency room at once. NOTE: This medicine is only for you. Do not share this medicine with others. What if I miss a dose? It is important not to miss your dose. Call your doctor or health careprofessional if you are unable to keep an appointment. What may interact with this medication? Do not take this medicine with any of the following medications: deferoxamine dimercaprol other iron products This medicine may also interact with the following medications: chloramphenicol deferasirox This list may not describe all possible interactions. Give your health care provider a list of all the medicines, herbs, non-prescription drugs, or dietary supplements you use. Also tell them if you smoke, drink alcohol, or use illegaldrugs. Some items may interact with your medicine. What should I watch for while using this medication? Visit your doctor or healthcare professional regularly. Tell your doctor or healthcare professional if your symptoms do not start to get better or if theyget worse. You may need blood work done while you are taking this medicine. You may need to follow a special diet. Talk to your doctor. Foods that contain iron include: whole grains/cereals, dried fruits, beans, or peas, leafy greenvegetables, and organ meats (liver, kidney). What side effects may I notice from  receiving this medication? Side effects that you should report to your doctor or health care professionalas soon as possible: allergic reactions like skin rash, itching or hives, swelling of the face, lips, or tongue breathing problems changes in blood pressure cough fast, irregular heartbeat feeling faint or lightheaded, falls fever or chills flushing, sweating, or hot feelings joint or muscle aches/pains seizures swelling of the ankles or feet unusually weak or tired Side effects that usually do not require medical attention (report to yourdoctor or health care professional if they continue or are bothersome): diarrhea feeling achy headache irritation at site where injected nausea, vomiting stomach upset tiredness This list may not describe all possible side effects. Call your doctor for medical advice about side effects. You may report side effects to FDA at1-800-FDA-1088. Where should I keep my medication? This drug is given in a hospital or clinic and will not be stored at home. NOTE: This sheet is a summary. It may not cover all possible information. If you have questions about this medicine, talk to your doctor, pharmacist, orhealth care provider.  2022 Elsevier/Gold Standard (2010-09-30 17:14:35)  

## 2020-08-07 ENCOUNTER — Telehealth: Payer: Self-pay

## 2020-08-07 DIAGNOSIS — R300 Dysuria: Secondary | ICD-10-CM

## 2020-08-07 MED ORDER — SULFAMETHOXAZOLE-TRIMETHOPRIM 800-160 MG PO TABS: 1.0000 | ORAL_TABLET | Freq: Two times a day (BID) | ORAL | 0 refills | Status: DC

## 2020-08-07 NOTE — Telephone Encounter (Signed)
-----   Message from Carman Ching, [place] sent at 08/06/2020  4:51 PM EDT ----- Bactrim DS BID x7 days to clear urine prior to upcoming cystoscopy. ----- Message ----- From: Interface, [facility] Lab Results In Sent: 08/04/2020   4:36 PM EDT To: Carman Ching, PA-C

## 2020-08-07 NOTE — Telephone Encounter (Signed)
Patient unavailable according to husband, husband not on DPR. Advised husband to have patient return call ASAP. Bactrim sent to [facility] on [address]

## 2020-08-07 NOTE — Telephone Encounter (Signed)
Spoke with patient and advised results   

## 2020-08-11 ENCOUNTER — Encounter (INDEPENDENT_AMBULATORY_CARE_PROVIDER_SITE_OTHER): Payer: Medicare Other

## 2020-08-11 ENCOUNTER — Inpatient Hospital Stay
Admission: EM | Admit: 2020-08-11 | Discharge: 2020-08-13 | DRG: 372 | Disposition: A | Discharge status: Home / Self Care | Payer: Medicare Other | Attending: Internal Medicine | Admitting: Internal Medicine

## 2020-08-11 ENCOUNTER — Emergency Department: Payer: Medicare Other

## 2020-08-11 ENCOUNTER — Other Ambulatory Visit: Payer: Self-pay

## 2020-08-11 ENCOUNTER — Encounter: Payer: Self-pay | Admitting: Internal Medicine

## 2020-08-11 ENCOUNTER — Ambulatory Visit (INDEPENDENT_AMBULATORY_CARE_PROVIDER_SITE_OTHER): Payer: Medicare Other | Admitting: Vascular Surgery

## 2020-08-11 DIAGNOSIS — E1142 Type 2 diabetes mellitus with diabetic polyneuropathy: Secondary | ICD-10-CM | POA: Diagnosis present

## 2020-08-11 DIAGNOSIS — A0472 Enterocolitis due to Clostridium difficile, not specified as recurrent: Secondary | ICD-10-CM | POA: Diagnosis not present

## 2020-08-11 DIAGNOSIS — N183 Chronic kidney disease, stage 3 unspecified: Secondary | ICD-10-CM | POA: Diagnosis present

## 2020-08-11 DIAGNOSIS — Z86718 Personal history of other venous thrombosis and embolism: Secondary | ICD-10-CM

## 2020-08-11 DIAGNOSIS — R7401 Elevation of levels of liver transaminase levels: Secondary | ICD-10-CM | POA: Diagnosis not present

## 2020-08-11 DIAGNOSIS — Z79890 Hormone replacement therapy (postmenopausal): Secondary | ICD-10-CM

## 2020-08-11 DIAGNOSIS — Z7982 Long term (current) use of aspirin: Secondary | ICD-10-CM

## 2020-08-11 DIAGNOSIS — E113293 Type 2 diabetes mellitus with mild nonproliferative diabetic retinopathy without macular edema, bilateral: Secondary | ICD-10-CM | POA: Diagnosis present

## 2020-08-11 DIAGNOSIS — K921 Melena: Secondary | ICD-10-CM | POA: Diagnosis not present

## 2020-08-11 DIAGNOSIS — I70213 Atherosclerosis of native arteries of extremities with intermittent claudication, bilateral legs: Secondary | ICD-10-CM | POA: Diagnosis present

## 2020-08-11 DIAGNOSIS — K529 Noninfective gastroenteritis and colitis, unspecified: Secondary | ICD-10-CM | POA: Diagnosis present

## 2020-08-11 DIAGNOSIS — E785 Hyperlipidemia, unspecified: Secondary | ICD-10-CM | POA: Diagnosis present

## 2020-08-11 DIAGNOSIS — K219 Gastro-esophageal reflux disease without esophagitis: Secondary | ICD-10-CM | POA: Diagnosis present

## 2020-08-11 DIAGNOSIS — A0811 Acute gastroenteropathy due to Norwalk agent: Secondary | ICD-10-CM | POA: Diagnosis present

## 2020-08-11 DIAGNOSIS — R1030 Lower abdominal pain, unspecified: Secondary | ICD-10-CM

## 2020-08-11 DIAGNOSIS — Z7901 Long term (current) use of anticoagulants: Secondary | ICD-10-CM

## 2020-08-11 DIAGNOSIS — Z89421 Acquired absence of other right toe(s): Secondary | ICD-10-CM

## 2020-08-11 DIAGNOSIS — D509 Iron deficiency anemia, unspecified: Secondary | ICD-10-CM | POA: Diagnosis not present

## 2020-08-11 DIAGNOSIS — E1151 Type 2 diabetes mellitus with diabetic peripheral angiopathy without gangrene: Secondary | ICD-10-CM | POA: Diagnosis present

## 2020-08-11 DIAGNOSIS — Z8041 Family history of malignant neoplasm of ovary: Secondary | ICD-10-CM

## 2020-08-11 DIAGNOSIS — Z20822 Contact with and (suspected) exposure to covid-19: Secondary | ICD-10-CM | POA: Diagnosis present

## 2020-08-11 DIAGNOSIS — Z8249 Family history of ischemic heart disease and other diseases of the circulatory system: Secondary | ICD-10-CM

## 2020-08-11 DIAGNOSIS — N1832 Chronic kidney disease, stage 3b: Secondary | ICD-10-CM | POA: Diagnosis present

## 2020-08-11 DIAGNOSIS — I129 Hypertensive chronic kidney disease with stage 1 through stage 4 chronic kidney disease, or unspecified chronic kidney disease: Secondary | ICD-10-CM | POA: Diagnosis present

## 2020-08-11 DIAGNOSIS — F32A Depression, unspecified: Secondary | ICD-10-CM | POA: Diagnosis present

## 2020-08-11 DIAGNOSIS — E1122 Type 2 diabetes mellitus with diabetic chronic kidney disease: Secondary | ICD-10-CM | POA: Diagnosis present

## 2020-08-11 DIAGNOSIS — F419 Anxiety disorder, unspecified: Secondary | ICD-10-CM | POA: Diagnosis present

## 2020-08-11 DIAGNOSIS — Z87440 Personal history of urinary (tract) infections: Secondary | ICD-10-CM

## 2020-08-11 DIAGNOSIS — Z90710 Acquired absence of both cervix and uterus: Secondary | ICD-10-CM

## 2020-08-11 DIAGNOSIS — I10 Essential (primary) hypertension: Secondary | ICD-10-CM | POA: Diagnosis not present

## 2020-08-11 DIAGNOSIS — E1159 Type 2 diabetes mellitus with other circulatory complications: Secondary | ICD-10-CM | POA: Diagnosis present

## 2020-08-11 DIAGNOSIS — N179 Acute kidney failure, unspecified: Secondary | ICD-10-CM | POA: Insufficient documentation

## 2020-08-11 DIAGNOSIS — Z888 Allergy status to other drugs, medicaments and biological substances status: Secondary | ICD-10-CM

## 2020-08-11 DIAGNOSIS — Z79899 Other long term (current) drug therapy: Secondary | ICD-10-CM

## 2020-08-11 DIAGNOSIS — M109 Gout, unspecified: Secondary | ICD-10-CM | POA: Diagnosis present

## 2020-08-11 DIAGNOSIS — E039 Hypothyroidism, unspecified: Secondary | ICD-10-CM | POA: Diagnosis present

## 2020-08-11 DIAGNOSIS — A045 Campylobacter enteritis: Secondary | ICD-10-CM | POA: Diagnosis present

## 2020-08-11 DIAGNOSIS — I739 Peripheral vascular disease, unspecified: Secondary | ICD-10-CM | POA: Diagnosis present

## 2020-08-11 DIAGNOSIS — Z794 Long term (current) use of insulin: Secondary | ICD-10-CM

## 2020-08-11 DIAGNOSIS — I70219 Atherosclerosis of native arteries of extremities with intermittent claudication, unspecified extremity: Secondary | ICD-10-CM | POA: Diagnosis present

## 2020-08-11 DIAGNOSIS — Z8719 Personal history of other diseases of the digestive system: Secondary | ICD-10-CM

## 2020-08-11 DIAGNOSIS — Z87891 Personal history of nicotine dependence: Secondary | ICD-10-CM

## 2020-08-11 DIAGNOSIS — D631 Anemia in chronic kidney disease: Secondary | ICD-10-CM | POA: Diagnosis present

## 2020-08-11 DIAGNOSIS — K625 Hemorrhage of anus and rectum: Secondary | ICD-10-CM | POA: Diagnosis present

## 2020-08-11 DIAGNOSIS — Z7984 Long term (current) use of oral hypoglycemic drugs: Secondary | ICD-10-CM

## 2020-08-11 LAB — URINALYSIS, COMPLETE (UACMP) WITH MICROSCOPIC
Bacteria, UA: NONE SEEN
Bilirubin Urine: NEGATIVE
Glucose, UA: NEGATIVE mg/dL
Hgb urine dipstick: NEGATIVE
Ketones, ur: NEGATIVE mg/dL
Leukocytes,Ua: NEGATIVE
Nitrite: NEGATIVE
Protein, ur: 30 mg/dL — AB
Specific Gravity, Urine: 1.015 (ref 1.005–1.030)
pH: 6 (ref 5.0–8.0)

## 2020-08-11 LAB — CBC WITH DIFFERENTIAL/PLATELET
Abs Immature Granulocytes: 0.01 10*3/uL (ref 0.00–0.07)
Basophils Absolute: 0 10*3/uL (ref 0.0–0.1)
Basophils Relative: 1 %
Eosinophils Absolute: 0.3 10*3/uL (ref 0.0–0.5)
Eosinophils Relative: 5 %
HCT: 28.5 % — ABNORMAL LOW (ref 36.0–46.0)
Hemoglobin: 9.1 g/dL — ABNORMAL LOW (ref 12.0–15.0)
Immature Granulocytes: 0 %
Lymphocytes Relative: 18 %
Lymphs Abs: 0.9 10*3/uL (ref 0.7–4.0)
MCH: 28.8 pg (ref 26.0–34.0)
MCHC: 31.9 g/dL (ref 30.0–36.0)
MCV: 90.2 fL (ref 80.0–100.0)
Monocytes Absolute: 0.6 10*3/uL (ref 0.1–1.0)
Monocytes Relative: 12 %
Neutro Abs: 3.2 10*3/uL (ref 1.7–7.7)
Neutrophils Relative %: 64 %
Platelets: 293 10*3/uL (ref 150–400)
RBC: 3.16 MIL/uL — ABNORMAL LOW (ref 3.87–5.11)
RDW: 17.1 % — ABNORMAL HIGH (ref 11.5–15.5)
WBC: 5.1 10*3/uL (ref 4.0–10.5)
nRBC: 0 % (ref 0.0–0.2)

## 2020-08-11 LAB — GASTROINTESTINAL PANEL BY PCR, STOOL (REPLACES STOOL CULTURE)
Adenovirus F40/41: NOT DETECTED
Astrovirus: NOT DETECTED
Campylobacter species: DETECTED — AB
Cryptosporidium: NOT DETECTED
Cyclospora cayetanensis: NOT DETECTED
Entamoeba histolytica: NOT DETECTED
Enteroaggregative E coli (EAEC): NOT DETECTED
Enteropathogenic E coli (EPEC): NOT DETECTED
Enterotoxigenic E coli (ETEC): NOT DETECTED
Giardia lamblia: NOT DETECTED
Norovirus GI/GII: DETECTED — AB
Plesimonas shigelloides: NOT DETECTED
Rotavirus A: NOT DETECTED
Salmonella species: NOT DETECTED
Sapovirus (I, II, IV, and V): NOT DETECTED
Shiga like toxin producing E coli (STEC): NOT DETECTED
Shigella/Enteroinvasive E coli (EIEC): NOT DETECTED
Vibrio cholerae: NOT DETECTED
Vibrio species: NOT DETECTED
Yersinia enterocolitica: NOT DETECTED

## 2020-08-11 LAB — BASIC METABOLIC PANEL
Anion gap: 4 — ABNORMAL LOW (ref 5–15)
BUN: 33 mg/dL — ABNORMAL HIGH (ref 8–23)
CO2: 25 mmol/L (ref 22–32)
Calcium: 8.8 mg/dL — ABNORMAL LOW (ref 8.9–10.3)
Chloride: 106 mmol/L (ref 98–111)
Creatinine, Ser: 2.15 mg/dL — ABNORMAL HIGH (ref 0.44–1.00)
GFR, Estimated: 23 mL/min — ABNORMAL LOW (ref >60)
Glucose, Bld: 99 mg/dL (ref 70–99)
Potassium: 5 mmol/L (ref 3.5–5.1)
Sodium: 135 mmol/L (ref 135–145)

## 2020-08-11 LAB — RESP PANEL BY RT-PCR (FLU A&B, COVID) ARPGX2
Influenza A by PCR: NEGATIVE
Influenza B by PCR: NEGATIVE
SARS Coronavirus 2 by RT PCR: NEGATIVE

## 2020-08-11 LAB — C DIFFICILE QUICK SCREEN W PCR REFLEX
C Diff antigen: POSITIVE — AB
C Diff toxin: NEGATIVE

## 2020-08-11 LAB — HEMOGLOBIN A1C
Hgb A1c MFr Bld: 6.5 % — ABNORMAL HIGH (ref 4.8–5.6)
Mean Plasma Glucose: 139.85 mg/dL

## 2020-08-11 LAB — CBG MONITORING, ED: Glucose-Capillary: 129 mg/dL — ABNORMAL HIGH (ref 70–99)

## 2020-08-11 LAB — LACTIC ACID, PLASMA: Lactic Acid, Venous: 1.2 mmol/L (ref 0.5–1.9)

## 2020-08-11 LAB — HEPATIC FUNCTION PANEL
ALT: 30 U/L (ref 0–44)
AST: 48 U/L — ABNORMAL HIGH (ref 15–41)
Albumin: 3.4 g/dL — ABNORMAL LOW (ref 3.5–5.0)
Alkaline Phosphatase: 45 U/L (ref 38–126)
Bilirubin, Direct: 0.1 mg/dL (ref 0.0–0.2)
Indirect Bilirubin: 0.6 mg/dL (ref 0.3–0.9)
Total Bilirubin: 0.7 mg/dL (ref 0.3–1.2)
Total Protein: 6.8 g/dL (ref 6.5–8.1)

## 2020-08-11 LAB — LIPASE, BLOOD: Lipase: 59 U/L — ABNORMAL HIGH (ref 11–51)

## 2020-08-11 LAB — GLUCOSE, CAPILLARY: Glucose-Capillary: 97 mg/dL (ref 70–99)

## 2020-08-11 LAB — CLOSTRIDIUM DIFFICILE BY PCR, REFLEXED: Toxigenic C. Difficile by PCR: NEGATIVE

## 2020-08-11 IMAGING — CT CT ABD-PELV W/O CM
2 of 4 series · 15 of 46 positions shown, 17 images · non-contrast
Comparison: CT Abdomen and Pelvis [DATE] and earlier.

CLINICAL DATA: 75-year-old female with left lower quadrant
abdominal pain and episodes of bloody stools over the past 4 days.

EXAM:
CT ABDOMEN AND PELVIS WITHOUT CONTRAST
TECHNIQUE: Multidetector CT imaging of the abdomen and pelvis was performed
following the standard protocol without IV contrast.

[Series 2: routine abd/(person_name) (person_name) · axial · 0.91mm/px · z∈[-986,-570]mm · 12 of 93 slices shown, 14 images]
[im 5/93  soft-tissue]
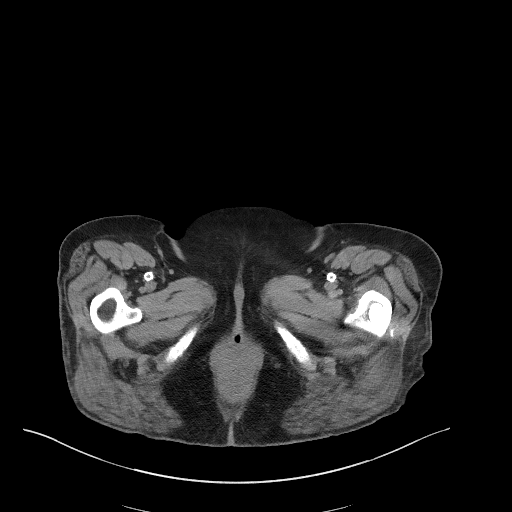
[im 5/93  bone]
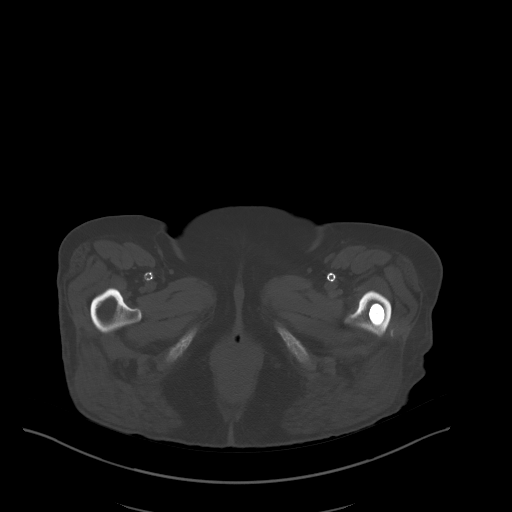
[im 13/93  soft-tissue]
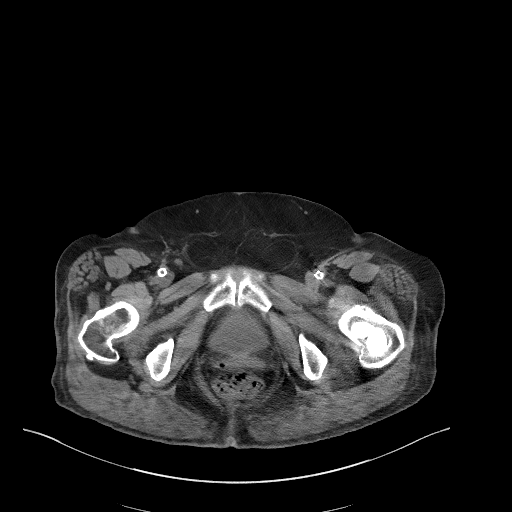
[im 21/93  soft-tissue]
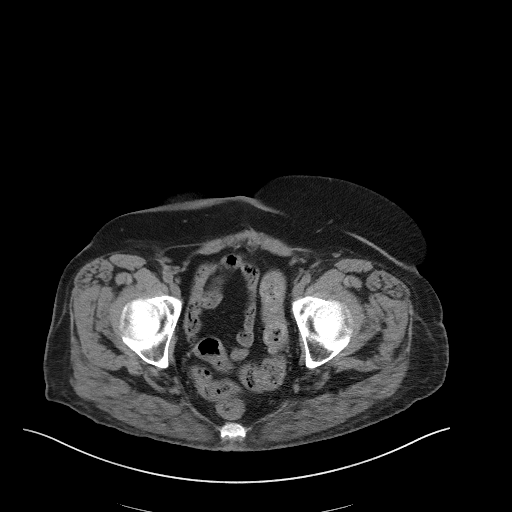
[im 30/93  soft-tissue]
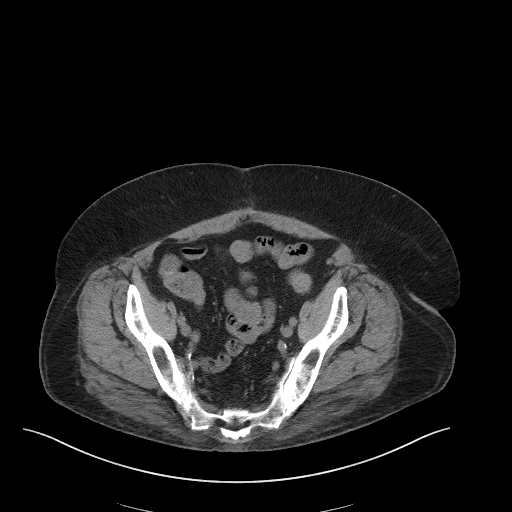
[im 34/93  soft-tissue]
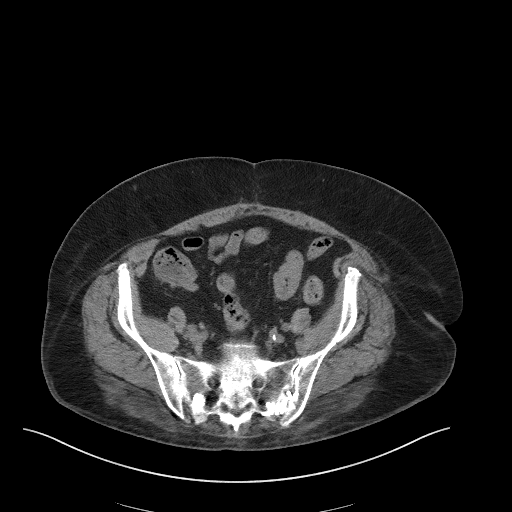
[im 42/93  soft-tissue]
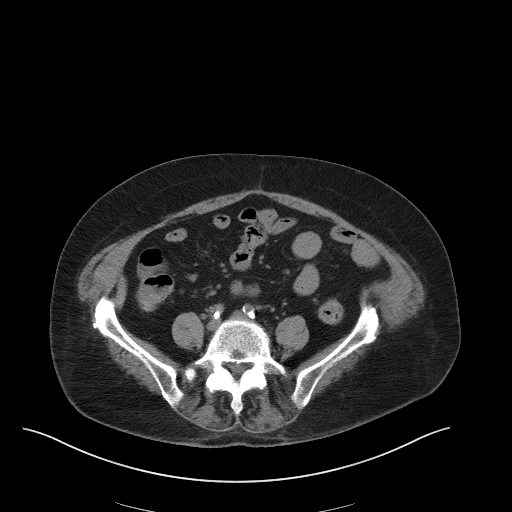
[im 51/93  soft-tissue]
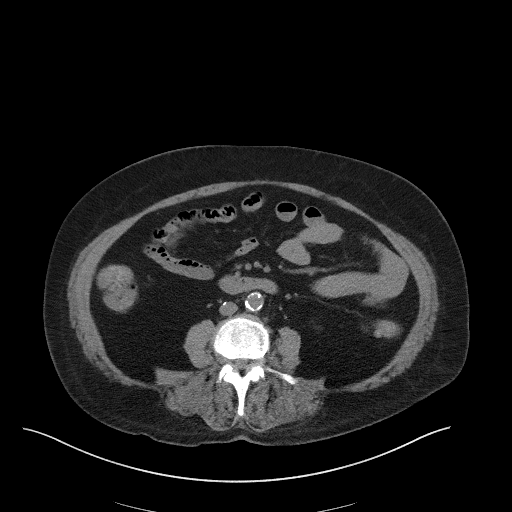
[im 59/93  soft-tissue]
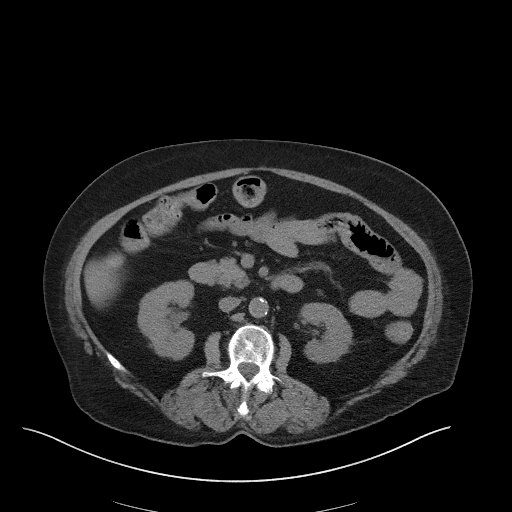
[im 63/93  soft-tissue]
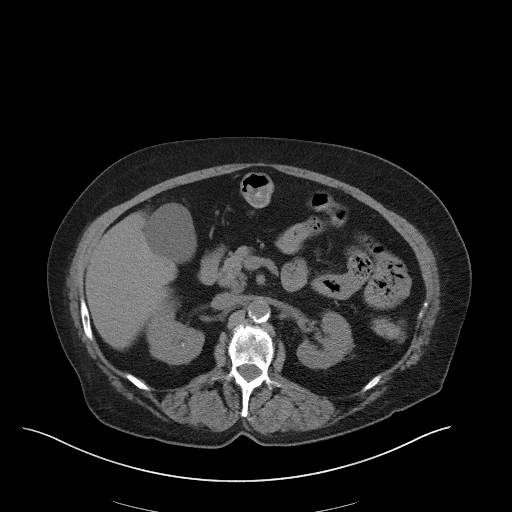
[im 63/93  bone]
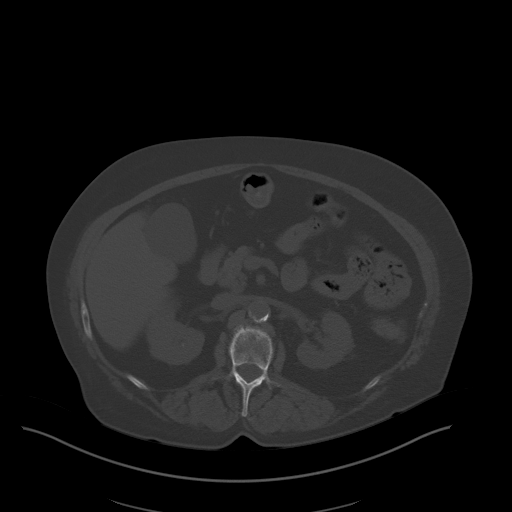
[im 72/93  soft-tissue]
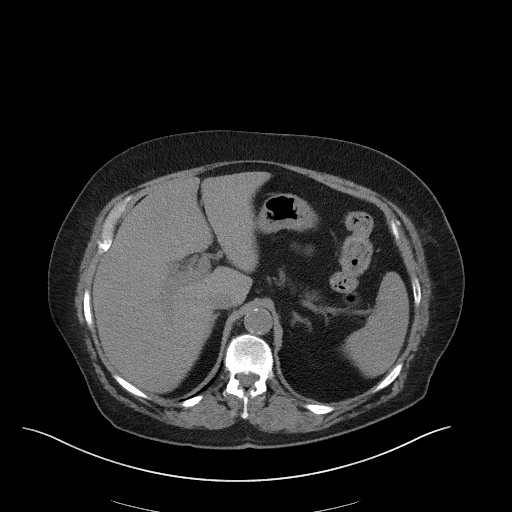
[im 80/93  soft-tissue]
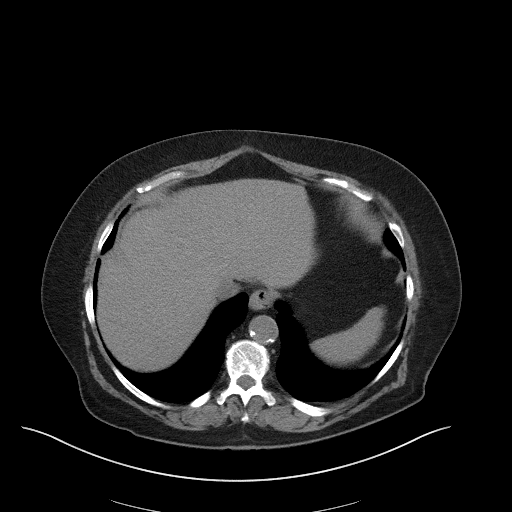
[im 88/93  soft-tissue]
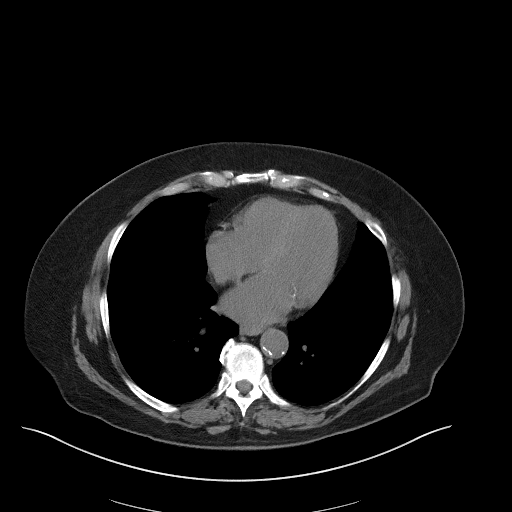

[Series 5: coronal st · coronal · 0.75mm/px · 3 of 93 slices shown]
[im 31/93  soft-tissue]
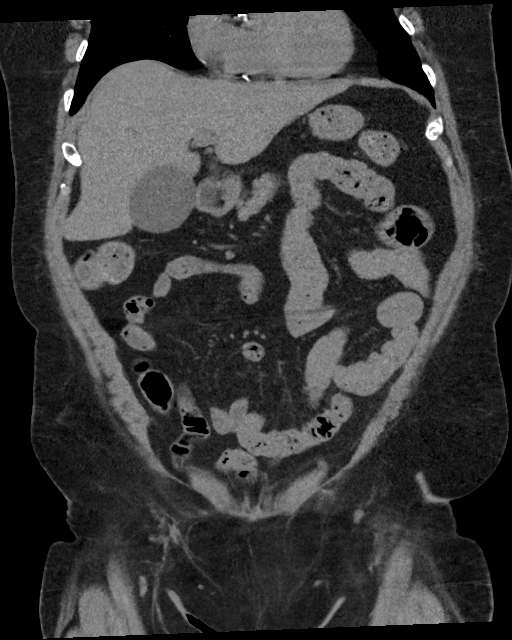
[im 41/93  soft-tissue]
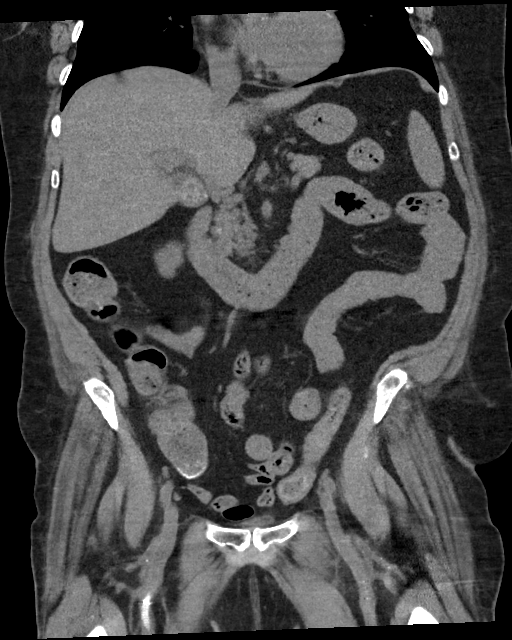
[im 52/93  soft-tissue]
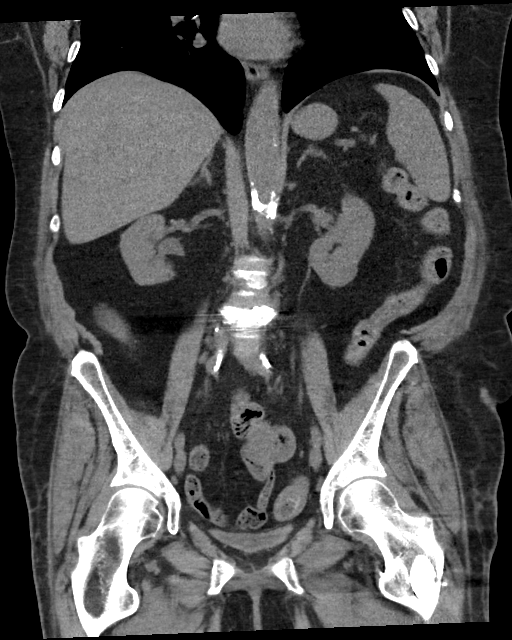

[15 of 46 positions shown; findings below may reference images not displayed]

FINDINGS: Lower chest: Stable, negative.

Hepatobiliary: Chronic cholelithiasis. The gallbladder is larger
today but there is no pericholecystic inflammation. Negative
noncontrast liver.

Pancreas: Negative.

Spleen: Negative.

Adrenals/Urinary Tract: Normal adrenal glands. Nonobstructed
kidneys. 2-3 mm right upper pole nephrolithiasis. Questionable
punctate left nephrolithiasis. Scattered small areas of renal
cortical scarring appear stable. Proximal ureters are decompressed.
Diminutive and unremarkable bladder.

Stomach/Bowel: Redundant distal large bowel with some retained
stool. Subtle 7 cm segment of sigmoid colon inflammation suspected
on series 2, image 73 with mild sigmoid wall thickening and an
indistinct appearance of the wall. No regional diverticula. No
mesenteric inflammation.

Upstream descending colon is within normal limits. Mildly redundant
transverse colon. Negative right colon. Diminutive or absent
appendix. Decompressed and negative terminal ileum. No dilated small
bowel. Negative stomach and duodenum. No free air or free fluid.
Stable postoperative changes to the lower midline abdominal wall.

Vascular/Lymphatic: Extensive Aortoiliac calcified atherosclerosis.
Chronic postoperative changes overlying the right common femoral
artery with bilateral proximal femoral vascular stents. Vascular
patency is not evaluated in the absence of IV contrast. No
lymphadenopathy.

Reproductive: Absent uterus.  Diminutive or absent ovaries.

Other: No pelvic free fluid. Small fat containing inguinal hernias
are stable.

Musculoskeletal: Stable visualized osseous structures. Chronic
lumbar compression fractures, L4 previously augmented. Previous
bilateral sacroplasty. Previous left femur ORIF.
IMPRESSION: 1. Subtle wall thickening of a 7 cm segment of the sigmoid colon
suspicious for mild colitis in this setting. No associated
diverticula.

2. No other acute or inflammatory process identified in the
non-contrast abdomen or pelvis.
Chronic cholelithiasis.
Right nephrolithiasis.
Aortic Atherosclerosis ([0B]-[0B]).
Bilateral proximal femoral artery stents.

## 2020-08-11 MED ORDER — MIRABEGRON ER 25 MG PO TB24
25.0000 mg | ORAL_TABLET | Freq: Every day | ORAL | Status: DC
  Administered 2020-08-12: 25 mg via ORAL
  Filled 2020-08-11: qty 1

## 2020-08-11 MED ORDER — GABAPENTIN 100 MG PO CAPS
100.0000 mg | ORAL_CAPSULE | Freq: Two times a day (BID) | ORAL | Status: DC
  Administered 2020-08-11 – 2020-08-13 (×4): 100 mg via ORAL
  Filled 2020-08-11 (×4): qty 1

## 2020-08-11 MED ORDER — VITAMIN D 25 MCG (1000 UNIT) PO TABS
1000.0000 [IU] | ORAL_TABLET | Freq: Two times a day (BID) | ORAL | Status: DC
  Administered 2020-08-11 – 2020-08-13 (×4): 1000 [IU] via ORAL
  Filled 2020-08-11 (×4): qty 1

## 2020-08-11 MED ORDER — ZOLPIDEM TARTRATE 5 MG PO TABS: 10.0000 mg | ORAL_TABLET | Freq: Every evening | ORAL | Status: DC | PRN

## 2020-08-11 MED ORDER — MAGNESIUM OXIDE -MG SUPPLEMENT 400 (240 MG) MG PO TABS
400.0000 mg | ORAL_TABLET | Freq: Every day | ORAL | Status: DC
  Administered 2020-08-12 – 2020-08-13 (×2): 400 mg via ORAL
  Filled 2020-08-11 (×2): qty 1

## 2020-08-11 MED ORDER — METRONIDAZOLE 500 MG/100ML IV SOLN: 500.0000 mg | Freq: Three times a day (TID) | INTRAVENOUS | Status: DC

## 2020-08-11 MED ORDER — ESCITALOPRAM OXALATE 10 MG PO TABS
10.0000 mg | ORAL_TABLET | Freq: Every day | ORAL | Status: DC
  Administered 2020-08-11 – 2020-08-13 (×3): 10 mg via ORAL
  Filled 2020-08-11 (×4): qty 1

## 2020-08-11 MED ORDER — ACETAMINOPHEN 325 MG PO TABS: 650.0000 mg | ORAL_TABLET | Freq: Four times a day (QID) | ORAL | Status: DC | PRN

## 2020-08-11 MED ORDER — INSULIN ASPART 100 UNIT/ML IJ SOLN
0.0000 [IU] | Freq: Three times a day (TID) | INTRAMUSCULAR | Status: DC
  Administered 2020-08-11: 2 [IU] via SUBCUTANEOUS
  Administered 2020-08-12: 5 [IU] via SUBCUTANEOUS
  Administered 2020-08-13: 11 [IU] via SUBCUTANEOUS
  Administered 2020-08-13: 2 [IU] via SUBCUTANEOUS
  Filled 2020-08-11 (×4): qty 1

## 2020-08-11 MED ORDER — PANTOPRAZOLE SODIUM 40 MG PO TBEC
40.0000 mg | DELAYED_RELEASE_TABLET | Freq: Every day | ORAL | Status: DC
  Administered 2020-08-12 – 2020-08-13 (×2): 40 mg via ORAL
  Filled 2020-08-11 (×2): qty 1

## 2020-08-11 MED ORDER — ALPRAZOLAM 0.5 MG PO TABS
0.2500 mg | ORAL_TABLET | Freq: Every day | ORAL | Status: DC | PRN
  Administered 2020-08-11 – 2020-08-12 (×2): 0.25 mg via ORAL
  Filled 2020-08-11 (×2): qty 1

## 2020-08-11 MED ORDER — SODIUM CHLORIDE 0.9 % IV SOLN
500.0000 mg | Freq: Every day | INTRAVENOUS | Status: DC
  Administered 2020-08-11 – 2020-08-12 (×2): 500 mg via INTRAVENOUS
  Filled 2020-08-11 (×3): qty 500

## 2020-08-11 MED ORDER — AMOXICILLIN-POT CLAVULANATE 875-125 MG PO TABS
1.0000 | ORAL_TABLET | Freq: Once | ORAL | Status: AC
  Administered 2020-08-11: 1 via ORAL
  Filled 2020-08-11: qty 1

## 2020-08-11 MED ORDER — ONDANSETRON HCL 4 MG/2ML IJ SOLN: 4.0000 mg | Freq: Four times a day (QID) | INTRAMUSCULAR | Status: DC | PRN

## 2020-08-11 MED ORDER — SODIUM CHLORIDE 0.9 % IV SOLN: 1.0000 g | INTRAVENOUS | Status: DC

## 2020-08-11 MED ORDER — LACTATED RINGERS IV BOLUS
1000.0000 mL | Freq: Once | INTRAVENOUS | Status: AC
  Administered 2020-08-11: 1000 mL via INTRAVENOUS

## 2020-08-11 MED ORDER — MIRABEGRON ER 50 MG PO TB24: 50.0000 mg | ORAL_TABLET | Freq: Every day | ORAL | Status: DC

## 2020-08-11 MED ORDER — ACETAMINOPHEN 650 MG RE SUPP: 650.0000 mg | Freq: Four times a day (QID) | RECTAL | Status: DC | PRN

## 2020-08-11 MED ORDER — LIDOCAINE-PRILOCAINE 2.5-2.5 % EX CREA
1.0000 "application " | TOPICAL_CREAM | CUTANEOUS | Status: DC | PRN
  Filled 2020-08-11: qty 5

## 2020-08-11 MED ORDER — ADULT MULTIVITAMIN W/MINERALS CH
1.0000 | ORAL_TABLET | Freq: Every day | ORAL | Status: DC
  Administered 2020-08-12 – 2020-08-13 (×2): 1 via ORAL
  Filled 2020-08-11 (×2): qty 1

## 2020-08-11 MED ORDER — VITAMIN B-12 100 MCG PO TABS
100.0000 ug | ORAL_TABLET | Freq: Every day | ORAL | Status: DC
  Administered 2020-08-12 – 2020-08-13 (×2): 100 ug via ORAL
  Filled 2020-08-11 (×3): qty 1

## 2020-08-11 MED ORDER — LEVOTHYROXINE SODIUM 100 MCG PO TABS
100.0000 ug | ORAL_TABLET | Freq: Every day | ORAL | Status: DC
  Administered 2020-08-12 – 2020-08-13 (×2): 100 ug via ORAL
  Filled 2020-08-11 (×2): qty 1

## 2020-08-11 MED ORDER — ASPIRIN EC 81 MG PO TBEC
81.0000 mg | DELAYED_RELEASE_TABLET | Freq: Every day | ORAL | Status: DC
  Administered 2020-08-11 – 2020-08-13 (×3): 81 mg via ORAL
  Filled 2020-08-11 (×3): qty 1

## 2020-08-11 MED ORDER — ONDANSETRON HCL 4 MG PO TABS: 4.0000 mg | ORAL_TABLET | Freq: Four times a day (QID) | ORAL | Status: DC | PRN

## 2020-08-11 NOTE — ED Notes (Signed)
Dr. Joylene Igo notified of positive stool sample results.

## 2020-08-11 NOTE — ED Provider Notes (Signed)
[facility] Emergency Department Provider Note  Time seen: 6:05 AM  I have reviewed the triage vital signs and the nursing notes.   HISTORY  Chief Complaint Rectal Bleeding   HPI Cheryl Leonard is a 75 y.o. female with a past medical history of anemia, anxiety, diabetes, gastric reflux, hypertension, hyperlipidemia, prior DVT on Xarelto who presents to the emergency department for rectal bleeding.  According to the patient several days ago she noticed a small amount of blood after wiping small amount in the toilet.  She states early this morning she had another bowel movement with more significant amount of blood so she came to the emergency department for evaluation.  Denies any history of GI bleeding the past.  Patient is on Xarelto.  Does state over the last several days she has had some mild lower abdominal pain as well.  No nausea or vomiting.  No fever.   Past Medical History:  Diagnosis Date   Anemia    Anxiety    Arthritis    Gout   Cataracts, both eyes    Diabetic retinopathy (HCC)    NPDR OU   Diabetic retinopathy (HCC)    GERD (gastroesophageal reflux disease)    Gout    Headache    h/o migraines   History of fracture of patella    right knee   History of positive PPD    Patient always shows positive   Hyperlipidemia    Hypertension    Hypertensive retinopathy    OU   Hypothyroidism    Lichen sclerosus 12/30/2013   of vulva   Metatarsal fracture    Neuropathy    Peripheral vascular disease (HCC)    Polyneuropathy    numbness and tingling in feet and toes   Renal insufficiency    Stage 3   Sleep apnea    does not use cpap-lost weight    Type 2 diabetes mellitus, uncontrolled (HCC)     Patient Active Problem List   Diagnosis Date Noted   Gangrene of toe of right foot (HCC) 05/04/2020   History of 2019 novel coronavirus disease (COVID-19) 02/20/2019   Anemia of chronic kidney failure, stage 3 (moderate) (HCC) 02/05/2019    Normocytic anemia 02/01/2019   Osteoporosis 11/15/2018   Atherosclerosis of native arteries of extremity with rest pain (HCC) 09/25/2018   Osteoporosis, 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   Wound disruption, post-op, skin, subsequent encounter 04/09/2018   Postoperative wound infection 03/21/2018   Atherosclerosis of artery of extremity with intermittent claudication (HCC) 03/08/2018   Atherosclerosis of native arteries of the extremities with ulceration (HCC) 10/13/2017   DM type 2 with diabetic peripheral neuropathy (HCC) 10/12/2017   History of hip fracture 10/12/2017   Type 2 diabetes mellitus with vascular disease (HCC) 10/12/2017   Carotid atherosclerosis, bilateral 07/11/2017   Medicare annual wellness visit, initial 05/03/2017   Peripheral vascular disease, unspecified (HCC) 03/01/2017   Diabetic ulcer of toe of left foot associated with type 2 diabetes mellitus (HCC) 03/01/2017   Chronic painful diabetic neuropathy (HCC) 06/02/2016   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   Hip fracture (HCC) 10/30/2015   Greater trochanter fracture (HCC) 10/29/2015   Acquired hypothyroidism 10/16/2015   Stage 3b chronic kidney disease (HCC) 10/16/2015   Hyperlipidemia, mixed 04/16/2015   Benign essential hypertension 04/16/2015   Closed compression fracture of lumbar vertebra (HCC) 04/16/2015   Iron deficiency anemia 09/22/2014   Disease of thyroid gland 09/22/2014   Diabetes mellitus type 2, uncontrolled (HCC)  05/13/2014   Lichen sclerosus 12/30/2013   History of positive PPD 10/08/2013    Past Surgical History:  Procedure Laterality Date   ABDOMINAL HYSTERECTOMY     AMPUTATION TOE Right 05/08/2020   Procedure: AMPUTATION TOE-Right 4th Toe;  Surgeon: Rosetta PosnerBaker, Andrew, DPM;  Location: [facility];  Service: Podiatry;  Laterality: Right;   APPENDECTOMY     BREAST REDUCTION SURGERY  2001   CATARACT EXTRACTION     CESAREAN SECTION  1976   COLONOSCOPY  03/05/2013   Nml - due for repeat  03/06/2018   COLONOSCOPY WITH PROPOFOL N/A 03/18/2019   Procedure: COLONOSCOPY WITH PROPOFOL;  Surgeon: Toledo, Boykin Nearingeodoro K, MD;  Location: [facility];  Service: Gastroenterology;  Laterality: N/A;   DIAGNOSTIC LAPAROSCOPY     DILATION AND CURETTAGE OF UTERUS  1989   ENDARTERECTOMY FEMORAL Bilateral 03/09/2018   Procedure: ENDARTERECTOMY FEMORAL;  Surgeon: Annice Needyew, Jason S, MD;  Location: [facility];  Service: Vascular;  Laterality: Bilateral;   ENDARTERECTOMY POPLITEAL Left 03/09/2018   Procedure: ENDARTERECTOMY POPLITEAL AND SFA;  Surgeon: Annice Needyew, Jason S, MD;  Location: [facility];  Service: Vascular;  Laterality: Left;   ESOPHAGOGASTRODUODENOSCOPY  03/05/2013   ESOPHAGOGASTRODUODENOSCOPY (EGD) WITH PROPOFOL N/A 03/18/2019   Procedure: ESOPHAGOGASTRODUODENOSCOPY (EGD) WITH PROPOFOL;  Surgeon: Toledo, Boykin Nearingeodoro K, MD;  Location: [facility];  Service: Gastroenterology;  Laterality: N/A;   EYE SURGERY     Eyelid Surgery  2012   INTRAMEDULLARY (IM) NAIL INTERTROCHANTERIC Left 10/30/2015   Procedure: INTRAMEDULLARY (IM) NAIL INTERTROCHANTRIC ;  Surgeon: Kennedy BuckerMichael Menz, MD;  Location: [facility];  Service: Orthopedics;  Laterality: Left;   KYPHOPLASTY N/A 10/25/2018   Procedure: L4 KYPHOPLASTY;  Surgeon: Kennedy BuckerMenz, Michael, MD;  Location: [facility];  Service: Orthopedics;  Laterality: N/A;   LAPAROSCOPIC HYSTERECTOMY  2000   total   LOWER EXTREMITY ANGIOGRAPHY Left 03/08/2017   Procedure: LOWER EXTREMITY ANGIOGRAPHY;  Surgeon: Annice Needyew, Jason S, MD;  Location: [facility];  Service: Cardiovascular;  Laterality: Left;   LOWER EXTREMITY ANGIOGRAPHY Left 10/30/2017   Procedure: LOWER EXTREMITY ANGIOGRAPHY;  Surgeon: Annice Needyew, Jason S, MD;  Location: [facility];  Service: Cardiovascular;  Laterality: Left;   LOWER EXTREMITY ANGIOGRAPHY Right 03/08/2018   Procedure: LOWER EXTREMITY ANGIOGRAPHY;  Surgeon: Annice Needyew, Jason S, MD;  Location: [facility];  Service: Cardiovascular;  Laterality: Right;   LOWER EXTREMITY  ANGIOGRAPHY Left 10/01/2018   Procedure: LOWER EXTREMITY ANGIOGRAPHY;  Surgeon: Annice Needyew, Jason S, MD;  Location: [facility];  Service: Cardiovascular;  Laterality: Left;   LOWER EXTREMITY ANGIOGRAPHY Right 10/08/2018   Procedure: LOWER EXTREMITY ANGIOGRAPHY;  Surgeon: Annice Needyew, Jason S, MD;  Location: [facility];  Service: Cardiovascular;  Laterality: Right;   LOWER EXTREMITY ANGIOGRAPHY Right 05/07/2020   Procedure: Lower Extremity Angiography;  Surgeon: Annice Needyew, Jason S, MD;  Location: [facility];  Service: Cardiovascular;  Laterality: Right;   PERIPHERAL VASCULAR INTERVENTION  03/08/2018   Procedure: PERIPHERAL VASCULAR INTERVENTION;  Surgeon: Annice Needyew, Jason S, MD;  Location: [facility];  Service: Cardiovascular;;   PORTA CATH INSERTION N/A 02/17/2020   Procedure: PORTA CATH INSERTION;  Surgeon: Annice Needyew, Jason S, MD;  Location: [facility];  Service: Cardiovascular;  Laterality: N/A;   REDUCTION MAMMAPLASTY  1997   SACROPLASTY N/A 10/25/2018   Procedure: S1 SACROPLASTY;  Surgeon: Kennedy BuckerMenz, Michael, MD;  Location: [facility];  Service: Orthopedics;  Laterality: N/A;    Prior to Admission medications   Medication Sig Start Date End Date  Taking? Authorizing Provider  ALPRAZolam Prudy Feeler) 0.25 MG tablet Take 0.25 mg by mouth daily as needed for anxiety or sleep.  05/07/18   [provider]  amLODipine (NORVASC) 5 MG tablet Take 5 mg by mouth daily.    [provider]  aspirin EC 81 MG tablet Take 81 mg by mouth daily.     [provider]  cholecalciferol (VITAMIN D) 1000 units tablet Take 1,000 Units by mouth 2 (two) times daily.    [provider]  CORAL CALCIUM PO Take 1 tablet by mouth 2 (two) times daily.     [provider]  denosumab (PROLIA) 60 MG/ML SOLN injection Inject 60 mg into the skin every 6 (six) months.  03/15/17   [provider]  DULoxetine (CYMBALTA) 30 MG capsule Take 30 mg by mouth daily. Patient not taking:  Reported on 08/04/2020 01/02/20   [provider]  escitalopram (LEXAPRO) 10 MG tablet Take 10 mg by mouth daily. 07/29/20   [provider]  estradiol (ESTRACE) 0.1 MG/GM vaginal cream Apply one pea-sized amount around the opening of the urethra daily for two weeks, then three times weekly thereafter. 04/27/20   Vaillancourt, Lelon Mast, PA-C  famotidine (PEPCID) 40 MG tablet Take 40 mg by mouth daily. 09/30/18   [provider]  furosemide (LASIX) 20 MG tablet Take 20 mg by mouth daily as needed for fluid or edema.  05/07/18 09/04/26  [provider]  gabapentin (NEURONTIN) 100 MG capsule Take 1 capsule by mouth 2 (two) times daily. 03/04/19   [provider]  Insulin Pen Needle 31G X 5 MM MISC once daily. 07/04/16   [provider]  levothyroxine (SYNTHROID, LEVOTHROID) 100 MCG tablet Take 100 mcg by mouth daily before breakfast.  08/03/14   [provider]  lidocaine-prilocaine (EMLA) cream Apply 1 application topically as needed (apply prior to port a cath access). 04/08/20   Earna Coder, MD  Magnesium 500 MG TABS Take 500 mg by mouth every morning.     [provider]  metFORMIN (GLUCOPHAGE) 1000 MG tablet Take 1 tablet by mouth 2 (two) times daily with a meal. 01/17/20   [provider]  metoprolol succinate (TOPROL-XL) 50 MG 24 hr tablet Take 50 mg by mouth daily. 12/04/18 05/04/20  [provider]  mirabegron ER (MYRBETRIQ) 50 MG TB24 tablet Take 1 tablet (50 mg total) by mouth daily. 08/04/20   Vaillancourt, Lelon Mast, PA-C  mirtazapine (REMERON) 15 MG tablet Take 15 mg by mouth at bedtime. 02/20/19 05/04/20  [provider]  Multiple Vitamin (MULTIVITAMIN WITH MINERALS) TABS tablet Take 1 tablet by mouth daily.    [provider]  nitrofurantoin, macrocrystal-monohydrate, (MACROBID) 100 MG capsule Take 100 mg by mouth 2 (two) times daily. 07/30/20   [provider]  nystatin cream (MYCOSTATIN)  Apply 1 application topically 2 (two) times daily. 06/03/20   [provider]  olmesartan (BENICAR) 20 MG tablet Take 20 mg by mouth daily. Patient not taking: Reported on 08/04/2020    [provider]  pantoprazole (PROTONIX) 40 MG tablet Take 40 mg by mouth every morning.     [provider]  rivaroxaban (XARELTO) 20 MG TABS tablet Take 1 tablet (20 mg total) by mouth daily with supper. Patient not taking: Reported on 08/04/2020 12/31/19   Georgiana Spinner, NP  rosuvastatin (CRESTOR) 20 MG tablet Take 20 mg by mouth every morning.  Patient not taking: Reported on 08/04/2020 03/19/18 09/04/26  [provider]  sulfamethoxazole-trimethoprim (BACTRIM DS) 800-160 MG tablet Take 1 tablet by mouth 2 (two) times daily for 7 days. 08/07/20 08/14/20  Vaillancourt, Samantha, PA-C  TRESIBA FLEXTOUCH 200 UNIT/ML SOPN Inject 30 Units as directed at bedtime.  11/15/16   [provider]  vitamin B-12 (CYANOCOBALAMIN) 100 MCG tablet Take 100 mcg by mouth daily.    [provider]  vitamin E 400 UNIT capsule Take 400 Units by mouth daily.    [provider]  zolpidem (AMBIEN) 5 MG tablet Take 1 tablet (5 mg total) by mouth at bedtime as needed for sleep. 05/09/20   Darlin Priestly, MD    Allergies  Allergen Reactions   Ace Inhibitors Other (See Comments)    Family History  Problem Relation Age of Onset   Coronary artery disease Father    Heart attack Father    Coronary artery disease Mother    Heart attack Mother    Ovarian cancer Sister 95       sister had hormonal therapy for IVF txs-which increased risk factor for ovarian cancer   Breast cancer Neg Hx     Social History Social History   Tobacco Use   Smoking status: Former    Packs/day: 1.00    Years: 20.00    Pack years: 20.00    Types: Cigarettes    Quit date: 03/07/1996    Years since quitting: 24.4   Smokeless tobacco: Never   Tobacco comments:    started smoking at age 61  Vaping Use    Vaping Use: Never used  Substance Use Topics   Alcohol use: No    Alcohol/week: 0.0 standard drinks   Drug use: No    Review of Systems Constitutional: Negative for fever. Cardiovascular: Negative for chest pain. Respiratory: Negative for shortness of breath. Gastrointestinal: Positive for abdominal pain mild dull pain in the lower abdomen.  Positive for rectal bleeding.  Negative for nausea or vomiting. Musculoskeletal: Negative for musculoskeletal complaints Neurological: Negative for headache All other ROS negative  ____________________________________________   PHYSICAL EXAM:  VITAL SIGNS: ED Triage Vitals  Enc Vitals Group     BP 08/11/20 0128 98/62     Pulse Rate 08/11/20 0128 67     Resp 08/11/20 0128 16     Temp 08/11/20 0128 98.5 F (36.9 C)     Temp Source 08/11/20 0128 Oral     SpO2 08/11/20 0128 100 %     Weight 08/11/20 0127 165 lb (74.8 kg)     Height 08/11/20 0127  (1.6 m)     Head Circumference --      Peak Flow --      Pain Score 08/11/20 0126 5     Pain Loc --      Pain Edu? --      Excl. in GC? --    Constitutional: Alert and oriented. Well appearing and in no distress. Eyes: Normal exam ENT      Head: Normocephalic and atraumatic.      Mouth/Throat: Mucous membranes are moist. Cardiovascular: Normal rate, regular rhythm. Respiratory: Normal respiratory effort without tachypnea nor retractions. Breath sounds are clear  Gastrointestinal: Soft, very slight suprapubic tenderness without rebound guarding or distention. Musculoskeletal: Nontender with normal range of motion in all extremities. Neurologic:  Normal speech and language. No gross focal neurologic deficits  Skin:  Skin is warm, dry and intact.  Psychiatric: Mood and affect are normal.   ____________________________________________  RADIOLOGY  CT scan pending  ____________________________________________   INITIAL IMPRESSION / ASSESSMENT AND PLAN / ED COURSE  Pertinent labs  & imaging results that were available during my care of the patient were reviewed by me and considered in my medical decision making (see chart for details).   Patient presents to the emergency department for some mild lower abdominal pain no rectal bleeding.  Rectal examination patient does have some darker appearing stool that is strongly guaiac positive.  Does have mild lower abdominal tenderness to palpation.  Differential would include diverticulitis, colitis, diverticulosis, internal hemorrhoids.  Patient states she did have a colonoscopy performed within the past 1 year by Dr. [name] that was normal per patient.  Patient's lab work today is largely reassuring, hemoglobin stable.  We will obtain a CT scan of the abdomen/pelvis to further evaluate.  Patient agreeable to plan of care.  Overall patient appears quite well with reassuring physical exam and vitals.  CT scan pending.  Patient care signed out to oncoming provider.  Cheryl Leonard was evaluated in Emergency Department on 08/11/2020 for the symptoms described in the history of present illness. She was evaluated in the context of the global COVID-19 pandemic, which necessitated consideration that the patient might be at risk for infection with the SARS-CoV-2 virus that causes COVID-19. Institutional protocols and algorithms that pertain to the evaluation of patients at risk for COVID-19 are in a state of rapid change based on information released by regulatory bodies including the CDC and federal and state organizations. These policies and algorithms were followed during the patient's care in the ED.  ____________________________________________   FINAL CLINICAL IMPRESSION(S) / ED DIAGNOSES  Lower abdominal pain Rectal bleeding   Minna Antis, MD 08/11/20 0700

## 2020-08-11 NOTE — Consult Note (Signed)
Cheryl BouillonVarnita Arrie Zuercher, MD [address], [address], [place], [place], [id number] [address], [place], [place], [address] Phone: [phone]  Fax: [phone]  Consultation  Referring Provider:     Dr. Joylene IgoAgbata Primary Care Physician:  Danella PentonMiller, Mark F, MD Reason for Consultation:     Colitis  Date of Admission:  08/11/2020 Date of Consultation:  08/11/2020         HPI:   Cheryl Leonard is a 75 y.o. female on Xarelto at home for peripheral arterial disease, who noted 1 small spot of red blood 4 days ago, and then small amount of blood streaks in her stool the day after that.  However, as of the last 1 to 2 days, she has not noted any red blood in stool, but does state that the stool appears dark for the last 2 days.  No hematemesis.  Denies any abdominal pain at this time, but states had abdominal discomfort that was dull, diffuse, 5 or 10, nonradiating about 4 days ago, that seems to have improved at this time.  No nausea or vomiting.  Reports being on antibiotics for UTI recently.  Hemoglobin on admission was 9.1, with baseline being around 8, with hemoglobin on July 8 being 8.6.  C Diff antigen was positive on admission with toxin negative, PCR negative.  GI panel was positive for norovirus and Campylobacter.  CT showed subtle wall thickening in the sigmoid colon, suspicious for mild colitis and GI was consulted.  Reports having previous endoscopic procedures [facility].  I was able to obtain previous records by [facility], and in Care Everywhere.  Patient was last seen by them in April 2022 and their notes were reviewed.  As per their notes, patient has a history of iron deficiency anemia and she has undergone colonoscopy and EGD with them in March 2021.  EGD showed gastritis, 1 cm hiatal hernia.  Colonoscopy nonbleeding internal hemorrhoids.  She also was positive for Giardia on stool testing in March 2022 and was treated with Flagyl.  They had planned on  doing small bowel capsule study but I do not see that this was done yet.  As per their notes, patient had left lower quadrant abdominal pain and diarrhea at that time as well     Past Medical History:  Diagnosis Date   Anemia    Anxiety    Arthritis    Gout   Cataracts, both eyes    Diabetic retinopathy (HCC)    NPDR OU   Diabetic retinopathy (HCC)    GERD (gastroesophageal reflux disease)    Gout    Headache    h/o migraines   History of fracture of patella    right knee   History of positive PPD    Patient always shows positive   Hyperlipidemia    Hypertension    Hypertensive retinopathy    OU   Hypothyroidism    Lichen sclerosus 12/30/2013   of vulva   Metatarsal fracture    Neuropathy    Peripheral vascular disease (HCC)    Polyneuropathy    numbness and tingling in feet and toes   Renal insufficiency    Stage 3   Sleep apnea    does not use cpap-lost weight    Type 2 diabetes mellitus, uncontrolled (HCC)     Past Surgical History:  Procedure Laterality Date   ABDOMINAL HYSTERECTOMY     AMPUTATION TOE Right 05/08/2020   Procedure: AMPUTATION TOE-Right  4th Toe;  Surgeon: Rosetta Posner, DPM;  Location: [facility] OR[facility];  Service: Podiatry;  Laterality: Right;   APPENDECTOMY     BREAST REDUCTION SURGERY  2001   CATARACT EXTRACTION     CESAREAN SECTION  1976   COLONOSCOPY  03/05/2013   Nml - due for repeat 03/06/2018   COLONOSCOPY WITH PROPOFOL N/A 03/18/2019   Procedure: COLONOSCOPY WITH PROPOFOL;  Surgeon: Toledo, [name], MD;  Location: [facility];  Service: Gastroenterology;  Laterality: N/A;   DIAGNOSTIC LAPAROSCOPY     DILATION AND CURETTAGE OF UTERUS  1989   ENDARTERECTOMY FEMORAL Bilateral 03/09/2018   Procedure: ENDARTERECTOMY FEMORAL;  Surgeon: Annice Needy, MD;  Location: [facility];  Service: Vascular;  Laterality: Bilateral;   ENDARTERECTOMY POPLITEAL Left 03/09/2018   Procedure: ENDARTERECTOMY POPLITEAL AND SFA;  Surgeon: Annice Needy, MD;  Location: [facility]  [facility];  Service: Vascular;  Laterality: Left;   ESOPHAGOGASTRODUODENOSCOPY  03/05/2013   ESOPHAGOGASTRODUODENOSCOPY (EGD) WITH PROPOFOL N/A 03/18/2019   Procedure: ESOPHAGOGASTRODUODENOSCOPY (EGD) WITH PROPOFOL;  Surgeon: Toledo, [name], MD;  Location: [facility];  Service: Gastroenterology;  Laterality: N/A;   EYE SURGERY     Eyelid Surgery  2012   INTRAMEDULLARY (IM) NAIL INTERTROCHANTERIC Left 10/30/2015   Procedure: INTRAMEDULLARY (IM) NAIL INTERTROCHANTRIC ;  Surgeon: Kennedy Bucker, MD;  Location: [facility];  Service: Orthopedics;  Laterality: Left;   KYPHOPLASTY N/A 10/25/2018   Procedure: L4 KYPHOPLASTY;  Surgeon: Kennedy Bucker, MD;  Location: [facility];  Service: Orthopedics;  Laterality: N/A;   LAPAROSCOPIC HYSTERECTOMY  2000   total   LOWER EXTREMITY ANGIOGRAPHY Left 03/08/2017   Procedure: LOWER EXTREMITY ANGIOGRAPHY;  Surgeon: Annice Needy, MD;  Location: [facility];  Service: Cardiovascular;  Laterality: Left;   LOWER EXTREMITY ANGIOGRAPHY Left 10/30/2017   Procedure: LOWER EXTREMITY ANGIOGRAPHY;  Surgeon: Annice Needy, MD;  Location: [facility];  Service: Cardiovascular;  Laterality: Left;   LOWER EXTREMITY ANGIOGRAPHY Right 03/08/2018   Procedure: LOWER EXTREMITY ANGIOGRAPHY;  Surgeon: Annice Needy, MD;  Location: [facility];  Service: Cardiovascular;  Laterality: Right;   LOWER EXTREMITY ANGIOGRAPHY Left 10/01/2018   Procedure: LOWER EXTREMITY ANGIOGRAPHY;  Surgeon: Annice Needy, MD;  Location: [facility];  Service: Cardiovascular;  Laterality: Left;   LOWER EXTREMITY ANGIOGRAPHY Right 10/08/2018   Procedure: LOWER EXTREMITY ANGIOGRAPHY;  Surgeon: Annice Needy, MD;  Location: [facility];  Service: Cardiovascular;  Laterality: Right;   LOWER EXTREMITY ANGIOGRAPHY Right 05/07/2020   Procedure: Lower Extremity Angiography;  Surgeon: Annice Needy, MD;  Location: [facility];  Service: Cardiovascular;  Laterality: Right;    PERIPHERAL VASCULAR INTERVENTION  03/08/2018   Procedure: PERIPHERAL VASCULAR INTERVENTION;  Surgeon: Annice Needy, MD;  Location: [facility];  Service: Cardiovascular;;   PORTA CATH INSERTION N/A 02/17/2020   Procedure: PORTA CATH INSERTION;  Surgeon: Annice Needy, MD;  Location: [facility];  Service: Cardiovascular;  Laterality: N/A;   REDUCTION MAMMAPLASTY  1997   SACROPLASTY N/A 10/25/2018   Procedure: S1 SACROPLASTY;  Surgeon: Kennedy Bucker, MD;  Location: [facility];  Service: Orthopedics;  Laterality: N/A;    Prior to Admission medications   Medication Sig Start Date End Date Taking? Authorizing Provider  ALPRAZolam Prudy Feeler) 0.25 MG tablet Take 0.25 mg by mouth daily as needed for anxiety or sleep.  05/07/18  Yes [provider]  amLODipine (NORVASC) 5 MG tablet Take 5 mg by mouth daily.  Yes [provider]  aspirin EC 81 MG tablet Take 81 mg by mouth daily.    Yes [provider]  cholecalciferol (VITAMIN D) 1000 units tablet Take 1,000 Units by mouth 2 (two) times daily.   Yes [provider]  CORAL CALCIUM PO Take 1 tablet by mouth 2 (two) times daily.    Yes [provider]  escitalopram (LEXAPRO) 10 MG tablet Take 10 mg by mouth daily. 07/29/20  Yes [provider]  estradiol (ESTRACE) 0.1 MG/GM vaginal cream Apply one pea-sized amount around the opening of the urethra daily for two weeks, then three times weekly thereafter. 04/27/20  Yes Vaillancourt, Samantha, PA-C  famotidine (PEPCID) 40 MG tablet Take 40 mg by mouth daily. 09/30/18  Yes [provider]  furosemide (LASIX) 20 MG tablet Take 20 mg by mouth daily as needed for fluid or edema.  05/07/18 09/04/26 Yes [provider]  gabapentin (NEURONTIN) 100 MG capsule Take 1 capsule by mouth 2 (two) times daily. 03/04/19  Yes [provider]  levothyroxine (SYNTHROID, LEVOTHROID) 100 MCG tablet Take 100 mcg by mouth daily before breakfast.   08/03/14  Yes [provider]  Magnesium 500 MG TABS Take 500 mg by mouth every morning.    Yes [provider]  metFORMIN (GLUCOPHAGE) 1000 MG tablet Take 1 tablet by mouth 2 (two) times daily with a meal. 01/17/20  Yes [provider]  metoprolol succinate (TOPROL-XL) 50 MG 24 hr tablet Take 50 mg by mouth daily. 12/04/18 08/11/20 Yes [provider]  mirtazapine (REMERON) 15 MG tablet Take 15 mg by mouth at bedtime. 02/20/19 08/11/20 Yes [provider]  Multiple Vitamin (MULTIVITAMIN WITH MINERALS) TABS tablet Take 1 tablet by mouth daily.   Yes [provider]  nitrofurantoin, macrocrystal-monohydrate, (MACROBID) 100 MG capsule Take 100 mg by mouth 2 (two) times daily. 07/30/20  Yes [provider]  nystatin cream (MYCOSTATIN) Apply 1 application topically 2 (two) times daily. 06/03/20  Yes [provider]  olmesartan (BENICAR) 20 MG tablet Take 20 mg by mouth daily.   Yes [provider]  pantoprazole (PROTONIX) 40 MG tablet Take 40 mg by mouth every morning.    Yes [provider]  rivaroxaban (XARELTO) 20 MG TABS tablet Take 1 tablet (20 mg total) by mouth daily with supper. 12/31/19  Yes Georgiana Spinner, NP  rosuvastatin (CRESTOR) 20 MG tablet Take 20 mg by mouth every morning. 03/19/18 09/04/26 Yes [provider]  sulfamethoxazole-trimethoprim (BACTRIM DS) 800-160 MG tablet Take 1 tablet by mouth 2 (two) times daily for 7 days. 08/07/20 08/14/20 Yes Vaillancourt, Samantha, PA-C  TRESIBA FLEXTOUCH 200 UNIT/ML SOPN Inject 30 Units as directed at bedtime.  11/15/16  Yes [provider]  vitamin B-12 (CYANOCOBALAMIN) 100 MCG tablet Take 100 mcg by mouth daily.   Yes [provider]  vitamin E 400 UNIT capsule Take 400 Units by mouth daily.   Yes [provider]  zolpidem (AMBIEN) 10 MG tablet Take 10 mg by mouth at bedtime as needed for sleep. 06/06/20  Yes [provider]   denosumab (PROLIA) 60 MG/ML SOLN injection Inject 60 mg into the skin every 6 (six) months.  03/15/17   [provider]  DULoxetine (CYMBALTA) 30 MG capsule Take 30 mg by mouth daily. Patient not taking: Reported on 08/04/2020 01/02/20   [provider]  lidocaine-prilocaine (EMLA) cream Apply 1 application topically as needed (apply prior to port a cath access). 04/08/20  Earna Coder, MD  mirabegron ER (MYRBETRIQ) 50 MG TB24 tablet Take 1 tablet (50 mg total) by mouth daily. Patient not taking: No sig reported 08/04/20   Carman Ching, PA-C    Family History  Problem Relation Age of Onset   Coronary artery disease Father    Heart attack Father    Coronary artery disease Mother    Heart attack Mother    Ovarian cancer Sister 67       sister had hormonal therapy for IVF txs-which increased risk factor for ovarian cancer   Breast cancer Neg Hx      Social History   Tobacco Use   Smoking status: Former    Packs/day: 1.00    Years: 20.00    Pack years: 20.00    Types: Cigarettes    Quit date: 03/07/1996    Years since quitting: 24.4   Smokeless tobacco: Never   Tobacco comments:    started smoking at age 78  Vaping Use   Vaping Use: Never used  Substance Use Topics   Alcohol use: No    Alcohol/week: 0.0 standard drinks   Drug use: No    Allergies as of 08/11/2020 - Review Complete 08/11/2020  Allergen Reaction Noted   Ace inhibitors Other (See Comments) 09/23/2019    Review of Systems:    All systems reviewed and negative except where noted in HPI.   Physical Exam:  Constitutional: General:   Alert,  Well-developed, well-nourished, pleasant and cooperative in NAD BP (!) 126/48   Pulse 65   Temp 98.1 F (36.7 C) (Oral)   Resp 13   Ht 5\' 3"  (1.6 m)   Wt 74.8 kg   SpO2 98%   BMI 29.23 kg/m   Eyes:  Sclera clear, no icterus.   Conjunctiva pink. PERRLA  Ears:  No scars, lesions or masses, Normal auditory acuity. Nose:  No  deformity, discharge, or lesions. Mouth:  No deformity or lesions, oropharynx pink & moist.  Neck:  Supple; no masses or thyromegaly.  Respiratory: Normal respiratory effort, Normal percussion  Gastrointestinal:  Normal bowel sounds.  No bruits.  Soft, non-tender and non-distended without masses, hepatosplenomegaly or hernias noted.  No guarding or rebound tenderness.     Cardiac: No clubbing or edema.  No cyanosis. Normal posterior tibial pedal pulses noted.  Lymphatic:  No significant cervical or axillary adenopathy.  Psych:  Alert and cooperative. Normal mood and affect.  Musculoskeletal:  Normal gait. Head normocephalic, atraumatic. Symmetrical without gross deformities. 5/5 Upper and Lower extremity strength bilaterally.  Skin: Warm. Intact without significant lesions or rashes. No jaundice.  Neurologic:  Face symmetrical, tongue midline, Normal sensation to touch;  grossly normal neurologically.  Psych:  Alert and oriented x3, Alert and cooperative. Normal mood and affect.   LAB RESULTS: Recent Labs    08/11/20 0130  WBC 5.1  HGB 9.1*  HCT 28.5*  PLT 293   BMET Recent Labs    08/11/20 0130  NA 135  K 5.0  CL 106  CO2 25  GLUCOSE 99  BUN 33*  CREATININE 2.15*  CALCIUM 8.8*   LFT Recent Labs    08/11/20 0130  PROT 6.8  ALBUMIN 3.4*  AST 48*  ALT 30  ALKPHOS 45  BILITOT 0.7  BILIDIR 0.1  IBILI 0.6   PT/INR No results for input(s): LABPROT, INR in the last 72 hours.  STUDIES: CT ABDOMEN PELVIS WO CONTRAST  Result Date: 08/11/2020 CLINICAL DATA:  75 year old female with left  lower quadrant abdominal pain and episodes of bloody stools over the past 4 days. EXAM: CT ABDOMEN AND PELVIS WITHOUT CONTRAST TECHNIQUE: Multidetector CT imaging of the abdomen and pelvis was performed following the standard protocol without IV contrast. COMPARISON:  CT Abdomen and Pelvis 04/01/2020 and earlier. FINDINGS: Lower chest: Stable, negative. Hepatobiliary: Chronic  cholelithiasis. The gallbladder is larger today but there is no pericholecystic inflammation. Negative noncontrast liver. Pancreas: Negative. Spleen: Negative. Adrenals/Urinary Tract: Normal adrenal glands. Nonobstructed kidneys. 2-3 mm right upper pole nephrolithiasis. Questionable punctate left nephrolithiasis. Scattered small areas of renal cortical scarring appear stable. Proximal ureters are decompressed. Diminutive and unremarkable bladder. Stomach/Bowel: Redundant distal large bowel with some retained stool. Subtle 7 cm segment of sigmoid colon inflammation suspected on series 2, image 73 with mild sigmoid wall thickening and an indistinct appearance of the wall. No regional diverticula. No mesenteric inflammation. Upstream descending colon is within normal limits. Mildly redundant transverse colon. Negative right colon. Diminutive or absent appendix. Decompressed and negative terminal ileum. No dilated small bowel. Negative stomach and duodenum. No free air or free fluid. Stable postoperative changes to the lower midline abdominal wall. Vascular/Lymphatic: Extensive Aortoiliac calcified atherosclerosis. Chronic postoperative changes overlying the right common femoral artery with bilateral proximal femoral vascular stents. Vascular patency is not evaluated in the absence of IV contrast. No lymphadenopathy. Reproductive: Absent uterus.  Diminutive or absent ovaries. Other: No pelvic free fluid. Small fat containing inguinal hernias are stable. Musculoskeletal: Stable visualized osseous structures. Chronic lumbar compression fractures, L4 previously augmented. Previous bilateral sacroplasty. Previous left femur ORIF. IMPRESSION: 1. Subtle wall thickening of a 7 cm segment of the sigmoid colon suspicious for mild colitis in this setting. No associated diverticula. 2. No other acute or inflammatory process identified in the non-contrast abdomen or pelvis. Chronic cholelithiasis. Right nephrolithiasis. Aortic  Atherosclerosis (ICD10-I70.0). Bilateral proximal femoral artery stents. Electronically Signed   By: Odessa Fleming M.D.   On: 08/11/2020 07:11      Impression / Plan:   Laney Louderback Feuerstein is a 75 y.o. y/o female with blood streaks in stool, with some loose stools starting 4 days ago, with no further red blood noted in stool as per patient over the last 2 days  Blood in stool is likely due to current infection with norovirus and Campylobacter.  C. difficile toxin and PCR was negative and therefore positive antigen by itself may not represent true infection.  Patient has been started on azithromycin on admission which is appropriate.  However, if symptoms do not improve, C. difficile treatment can be considered  Her hemoglobin is better than her baseline, and the CT findings showing mild sigmoid colitis is likely due to her current infection.  Colonoscopy is unlikely to change management at this time and would put patient through risks of procedure without much benefit, unless symptoms do not improve, or anemia worsens.  Given her chronic iron deficiency anemia, patient will likely need close follow-up with her primary GI as an outpatient to consider further work-up, versus small bowel capsule study that they have originally planned  It does appear the patient has some chronic intermittent history of abdominal pain and diarrhea given that they have ordered work-up for this before as per their April 2022 notes.   Okay to advance diet as tolerated  Mild elevation in AST noted on admission.  Recheck with morning labs and if still elevated, further work-up can be ordered as necessary  Thank you for involving me in the care of this patient.  LOS: 0 days   Pasty Spillers, MD  08/11/2020, 5:21 PM

## 2020-08-11 NOTE — ED Triage Notes (Signed)
Pt states she has had 3 episodes of bloody stools over the past 4 days, pt states she has had an inc in fatigue over the past few days as well. Pt states she has lower abd cramping.

## 2020-08-11 NOTE — H&P (Signed)
History and Physical    Cheryl Leonard IDH:[id number] DOB: 11-03-1945 DOA: 08/11/2020  PCP: Danella Penton, MD   Patient coming from: Home  I have personally briefly reviewed patient's old medical records in [facility]  Chief Complaint: Rectal bleeding  HPI: Cheryl Leonard is a 75 y.o. female with medical history significant for peripheral artery disease on Xarelto followed by vascular surgery, essential hypertension, type 2 diabetes with CKD 3B, chronic normocytic anemia, who presented to [facility] ED for evaluation of bright red blood per rectum associated with abdominal discomfort and fatigue. Patient states that her symptoms started 4 days ago and states that she had a bowel movement after which she passed bright red blood.  She denies having any constipation and did not strain to have a bowel movement.  She does not have a history of hemorrhoids.  She states that over the last 4 days she has had abdominal discomfort mostly in the right lower quadrant area.  She denies having any fever or chills but has felt weak and fatigued.  She denies having any fever or chills. She denies having any chest pain, no shortness of breath, no leg swelling, no palpitations, no diaphoresis, no headache, no urinary symptoms, no blurred vision or focal deficits. She states that she was recently placed on Bactrim by her primary care provider for a urinary tract infection. Labs show sodium 135, potassium 5.0, chloride 106, bicarb 25, glucose 99, BUN 33, creatinine 2.15 above a baseline of 0.9, calcium 8.8, alkaline phosphatase 45, albumin 3.4, lipase 59, AST 28, ALT 30, total protein 6.8, lactic acid 1.2, white count 5.1, hemoglobin 9.1, hematocrit 28.5, MCV 90.2, RDW 17.1, platelet count 293 Respiratory viral panel is negative Stool studies are positive for C. difficile antigen, Campylobacter and norovirus were detected as well    ED Course: Patient is a 75 year old female who presents to the ER for  evaluation of bright red blood per rectum and now passing of black stools associated with weakness, fatigue and abdominal discomfort. Labs show worsening of her renal function from baseline and 3 months ago she had a serum creatinine of 0.94 today it is 2.15 CT scan of abdomen and pelvis is suspicious for mild colitis in the sigmoid colon Patient has been on antibiotics for UTI and stool is positive for C. Difficile antigen as well as Campylobacter norovirus. She will be referred to observation status for further evaluation.    Review of Systems: As per HPI otherwise all other systems reviewed and negative.    Past Medical History:  Diagnosis Date   Anemia    Anxiety    Arthritis    Gout   Cataracts, both eyes    Diabetic retinopathy (HCC)    NPDR OU   Diabetic retinopathy (HCC)    GERD (gastroesophageal reflux disease)    Gout    Headache    h/o migraines   History of fracture of patella    right knee   History of positive PPD    Patient always shows positive   Hyperlipidemia    Hypertension    Hypertensive retinopathy    OU   Hypothyroidism    Lichen sclerosus 12/30/2013   of vulva   Metatarsal fracture    Neuropathy    Peripheral vascular disease (HCC)    Polyneuropathy    numbness and tingling in feet and toes   Renal insufficiency    Stage 3   Sleep apnea    does not use  cpap-lost weight    Type 2 diabetes mellitus, uncontrolled (HCC)     Past Surgical History:  Procedure Laterality Date   ABDOMINAL HYSTERECTOMY     AMPUTATION TOE Right 05/08/2020   Procedure: AMPUTATION TOE-Right 4th Toe;  Surgeon: Rosetta PosnerBaker, Andrew, DPM;  Location: [facility] ORS;  Service: Podiatry;  Laterality: Right;   APPENDECTOMY     BREAST REDUCTION SURGERY  2001   CATARACT EXTRACTION     CESAREAN SECTION  1976   COLONOSCOPY  03/05/2013   Nml - due for repeat 03/06/2018   COLONOSCOPY WITH PROPOFOL N/A 03/18/2019   Procedure: COLONOSCOPY WITH PROPOFOL;  Surgeon: Toledo, Boykin Nearingeodoro K, MD;   Location: [facility];  Service: Gastroenterology;  Laterality: N/A;   DIAGNOSTIC LAPAROSCOPY     DILATION AND CURETTAGE OF UTERUS  1989   ENDARTERECTOMY FEMORAL Bilateral 03/09/2018   Procedure: ENDARTERECTOMY FEMORAL;  Surgeon: Annice Needyew, Jason S, MD;  Location: [facility];  Service: Vascular;  Laterality: Bilateral;   ENDARTERECTOMY POPLITEAL Left 03/09/2018   Procedure: ENDARTERECTOMY POPLITEAL AND SFA;  Surgeon: Annice Needyew, Jason S, MD;  Location: [facility];  Service: Vascular;  Laterality: Left;   ESOPHAGOGASTRODUODENOSCOPY  03/05/2013   ESOPHAGOGASTRODUODENOSCOPY (EGD) WITH PROPOFOL N/A 03/18/2019   Procedure: ESOPHAGOGASTRODUODENOSCOPY (EGD) WITH PROPOFOL;  Surgeon: Toledo, Boykin Nearingeodoro K, MD;  Location: [facility];  Service: Gastroenterology;  Laterality: N/A;   EYE SURGERY     Eyelid Surgery  2012   INTRAMEDULLARY (IM) NAIL INTERTROCHANTERIC Left 10/30/2015   Procedure: INTRAMEDULLARY (IM) NAIL INTERTROCHANTRIC ;  Surgeon: Kennedy BuckerMichael Menz, MD;  Location: [facility];  Service: Orthopedics;  Laterality: Left;   KYPHOPLASTY N/A 10/25/2018   Procedure: L4 KYPHOPLASTY;  Surgeon: Kennedy BuckerMenz, Michael, MD;  Location: [facility];  Service: Orthopedics;  Laterality: N/A;   LAPAROSCOPIC HYSTERECTOMY  2000   total   LOWER EXTREMITY ANGIOGRAPHY Left 03/08/2017   Procedure: LOWER EXTREMITY ANGIOGRAPHY;  Surgeon: Annice Needyew, Jason S, MD;  Location: [facility];  Service: Cardiovascular;  Laterality: Left;   LOWER EXTREMITY ANGIOGRAPHY Left 10/30/2017   Procedure: LOWER EXTREMITY ANGIOGRAPHY;  Surgeon: Annice Needyew, Jason S, MD;  Location: [facility];  Service: Cardiovascular;  Laterality: Left;   LOWER EXTREMITY ANGIOGRAPHY Right 03/08/2018   Procedure: LOWER EXTREMITY ANGIOGRAPHY;  Surgeon: Annice Needyew, Jason S, MD;  Location: [facility];  Service: Cardiovascular;  Laterality: Right;   LOWER EXTREMITY ANGIOGRAPHY Left 10/01/2018   Procedure: LOWER EXTREMITY ANGIOGRAPHY;  Surgeon: Annice Needyew, Jason S, MD;  Location: [facility];  Service: Cardiovascular;  Laterality: Left;   LOWER EXTREMITY ANGIOGRAPHY Right 10/08/2018   Procedure: LOWER EXTREMITY ANGIOGRAPHY;  Surgeon: Annice Needyew, Jason S, MD;  Location: [facility];  Service: Cardiovascular;  Laterality: Right;   LOWER EXTREMITY ANGIOGRAPHY Right 05/07/2020   Procedure: Lower Extremity Angiography;  Surgeon: Annice Needyew, Jason S, MD;  Location: [facility];  Service: Cardiovascular;  Laterality: Right;   PERIPHERAL VASCULAR INTERVENTION  03/08/2018   Procedure: PERIPHERAL VASCULAR INTERVENTION;  Surgeon: Annice Needyew, Jason S, MD;  Location: [facility];  Service: Cardiovascular;;   PORTA CATH INSERTION N/A 02/17/2020   Procedure: PORTA CATH INSERTION;  Surgeon: Annice Needyew, Jason S, MD;  Location: [facility];  Service: Cardiovascular;  Laterality: N/A;   REDUCTION MAMMAPLASTY  1997   SACROPLASTY N/A 10/25/2018   Procedure: S1 SACROPLASTY;  Surgeon: Kennedy BuckerMenz, Michael, MD;  Location: [facility];  Service: Orthopedics;  Laterality: N/A;     reports that she quit smoking about 24 years ago. Her smoking use  included cigarettes. She has a 20.00 pack-year smoking history. She has never used smokeless tobacco. She reports that she does not drink alcohol and does not use drugs.  Allergies  Allergen Reactions   Ace Inhibitors Other (See Comments)    Family History  Problem Relation Age of Onset   Coronary artery disease Father    Heart attack Father    Coronary artery disease Mother    Heart attack Mother    Ovarian cancer Sister 12       sister had hormonal therapy for IVF txs-which increased risk factor for ovarian cancer   Breast cancer Neg Hx       Prior to Admission medications   Medication Sig Start Date End Date Taking? Authorizing Provider  ALPRAZolam Prudy Feeler) 0.25 MG tablet Take 0.25 mg by mouth daily as needed for anxiety or sleep.  05/07/18   [provider]  amLODipine (NORVASC) 5 MG tablet Take 5 mg by mouth daily.    [provider]   aspirin EC 81 MG tablet Take 81 mg by mouth daily.     [provider]  cholecalciferol (VITAMIN D) 1000 units tablet Take 1,000 Units by mouth 2 (two) times daily.    [provider]  CORAL CALCIUM PO Take 1 tablet by mouth 2 (two) times daily.     [provider]  denosumab (PROLIA) 60 MG/ML SOLN injection Inject 60 mg into the skin every 6 (six) months.  03/15/17   [provider]  DULoxetine (CYMBALTA) 30 MG capsule Take 30 mg by mouth daily. Patient not taking: Reported on 08/04/2020 01/02/20   [provider]  escitalopram (LEXAPRO) 10 MG tablet Take 10 mg by mouth daily. 07/29/20   [provider]  estradiol (ESTRACE) 0.1 MG/GM vaginal cream Apply one pea-sized amount around the opening of the urethra daily for two weeks, then three times weekly thereafter. 04/27/20   Vaillancourt, Lelon Mast, PA-C  famotidine (PEPCID) 40 MG tablet Take 40 mg by mouth daily. 09/30/18   [provider]  furosemide (LASIX) 20 MG tablet Take 20 mg by mouth daily as needed for fluid or edema.  05/07/18 09/04/26  [provider]  gabapentin (NEURONTIN) 100 MG capsule Take 1 capsule by mouth 2 (two) times daily. 03/04/19   [provider]  Insulin Pen Needle 31G X 5 MM MISC once daily. 07/04/16   [provider]  levothyroxine (SYNTHROID, LEVOTHROID) 100 MCG tablet Take 100 mcg by mouth daily before breakfast.  08/03/14   [provider]  lidocaine-prilocaine (EMLA) cream Apply 1 application topically as needed (apply prior to port a cath access). 04/08/20   Earna Coder, MD  Magnesium 500 MG TABS Take 500 mg by mouth every morning.     [provider]  metFORMIN (GLUCOPHAGE) 1000 MG tablet Take 1 tablet by mouth 2 (two) times daily with a meal. 01/17/20   [provider]  metoprolol succinate (TOPROL-XL) 50 MG 24 hr tablet Take 50 mg by mouth daily. 12/04/18 05/04/20  [provider]  mirabegron ER  (MYRBETRIQ) 50 MG TB24 tablet Take 1 tablet (50 mg total) by mouth daily. 08/04/20   Vaillancourt, Lelon Mast, PA-C  mirtazapine (REMERON) 15 MG tablet Take 15 mg by mouth at bedtime. 02/20/19 05/04/20  [provider]  Multiple Vitamin (MULTIVITAMIN WITH MINERALS) TABS tablet Take 1 tablet by mouth daily.    [provider]  nitrofurantoin, macrocrystal-monohydrate, (MACROBID) 100 MG capsule Take 100 mg by mouth 2 (two)  times daily. 07/30/20   [provider]  nystatin cream (MYCOSTATIN) Apply 1 application topically 2 (two) times daily. 06/03/20   [provider]  olmesartan (BENICAR) 20 MG tablet Take 20 mg by mouth daily. Patient not taking: Reported on 08/04/2020    [provider]  pantoprazole (PROTONIX) 40 MG tablet Take 40 mg by mouth every morning.     [provider]  rivaroxaban (XARELTO) 20 MG TABS tablet Take 1 tablet (20 mg total) by mouth daily with supper. Patient not taking: Reported on 08/04/2020 12/31/19   Georgiana Spinner, NP  rosuvastatin (CRESTOR) 20 MG tablet Take 20 mg by mouth every morning.  Patient not taking: Reported on 08/04/2020 03/19/18 09/04/26  [provider]  sulfamethoxazole-trimethoprim (BACTRIM DS) 800-160 MG tablet Take 1 tablet by mouth 2 (two) times daily for 7 days. 08/07/20 08/14/20  Vaillancourt, Samantha, PA-C  TRESIBA FLEXTOUCH 200 UNIT/ML SOPN Inject 30 Units as directed at bedtime.  11/15/16   [provider]  vitamin B-12 (CYANOCOBALAMIN) 100 MCG tablet Take 100 mcg by mouth daily.    [provider]  vitamin E 400 UNIT capsule Take 400 Units by mouth daily.    [provider]  zolpidem (AMBIEN) 10 MG tablet Take 10 mg by mouth at bedtime as needed for sleep. 06/06/20   [provider]    Physical Exam: Vitals:   08/11/20 0833 08/11/20 0900 08/11/20 1000 08/11/20 1030  BP: (!) 120/55 (!) 107/52 (!) 119/54 (!) 112/55  Pulse: 67 65 66 60  Resp: 12 18 11 12   Temp:       TempSrc:      SpO2: 100% 100% 98% 98%  Weight:      Height:         Vitals:   08/11/20 0833 08/11/20 0900 08/11/20 1000 08/11/20 1030  BP: (!) 120/55 (!) 107/52 (!) 119/54 (!) 112/55  Pulse: 67 65 66 60  Resp: 12 18 11 12   Temp:      TempSrc:      SpO2: 100% 100% 98% 98%  Weight:      Height:          Constitutional: Alert and oriented x 3 . Not in any apparent distress HEENT:      Head: Normocephalic and atraumatic.         Eyes: PERLA, EOMI, Conjunctivae pallor Sclera is non-icteric.       Mouth/Throat: Mucous membranes are moist.       Neck: Supple with no signs of meningismus. Cardiovascular: Regular rate and rhythm. No murmurs, gallops, or rubs. 2+ symmetrical distal pulses are present . No JVD. No LE edema Respiratory: Respiratory effort normal .Lungs sounds clear bilaterally. No wheezes, crackles, or rhonchi.  Gastrointestinal: Soft,  tender right lower quadrant, and non distended with positive bowel sounds.  Genitourinary: No CVA tenderness. Musculoskeletal: Nontender with normal range of motion in all extremities. No cyanosis, or erythema of extremities. Neurologic:  Face is symmetric. Moving all extremities. No gross focal neurologic deficits . Skin: Skin is warm, dry.  No rash or ulcers Psychiatric: Mood and affect are normal    Labs on Admission: I have personally reviewed following labs and imaging studies  CBC: Recent Labs  Lab 08/11/20 0130  WBC 5.1  NEUTROABS 3.2  HGB 9.1*  HCT 28.5*  MCV 90.2  PLT 293   Basic Metabolic Panel: Recent Labs  Lab 08/11/20 0130  NA 135  K 5.0  CL 106  CO2 25  GLUCOSE 99  BUN 33*  CREATININE 2.15*  CALCIUM 8.8*   GFR: Estimated Creatinine Clearance: 21.9 mL/min (A) (by C-G formula based on SCr of 2.15 mg/dL (H)). Liver Function Tests: Recent Labs  Lab 08/11/20 0130  AST 48*  ALT 30  ALKPHOS 45  BILITOT 0.7  PROT 6.8  ALBUMIN 3.4*   Recent Labs  Lab 08/11/20 0130  LIPASE 59*   No results for  input(s): AMMONIA in the last 168 hours. Coagulation Profile: No results for input(s): INR, PROTIME in the last 168 hours. Cardiac Enzymes: No results for input(s): CKTOTAL, CKMB, CKMBINDEX, TROPONINI in the last 168 hours. BNP (last 3 results) No results for input(s): PROBNP in the last 8760 hours. HbA1C: No results for input(s): HGBA1C in the last 72 hours. CBG: No results for input(s): GLUCAP in the last 168 hours. Lipid Profile: No results for input(s): CHOL, HDL, LDLCALC, TRIG, CHOLHDL, LDLDIRECT in the last 72 hours. Thyroid Function Tests: No results for input(s): TSH, T4TOTAL, FREET4, T3FREE, THYROIDAB in the last 72 hours. Anemia Panel: No results for input(s): VITAMINB12, FOLATE, FERRITIN, TIBC, IRON, RETICCTPCT in the last 72 hours. Urine analysis:    Component Value Date/Time   COLORURINE YELLOW (A) 08/11/2020 0620   APPEARANCEUR CLEAR (A) 08/11/2020 0620   APPEARANCEUR Cloudy (A) 08/04/2020 1015   LABSPEC 1.015 08/11/2020 0620   PHURINE 6.0 08/11/2020 0620   GLUCOSEU NEGATIVE 08/11/2020 0620   HGBUR NEGATIVE 08/11/2020 0620   BILIRUBINUR NEGATIVE 08/11/2020 0620   BILIRUBINUR Negative 08/04/2020 1015   KETONESUR NEGATIVE 08/11/2020 0620   PROTEINUR 30 (A) 08/11/2020 0620   NITRITE NEGATIVE 08/11/2020 0620   LEUKOCYTESUR NEGATIVE 08/11/2020 0620    Radiological Exams on Admission: CT ABDOMEN PELVIS WO CONTRAST  Result Date: 08/11/2020 CLINICAL DATA:  75 year old female with left lower quadrant abdominal pain and episodes of bloody stools over the past 4 days. EXAM: CT ABDOMEN AND PELVIS WITHOUT CONTRAST TECHNIQUE: Multidetector CT imaging of the abdomen and pelvis was performed following the standard protocol without IV contrast. COMPARISON:  CT Abdomen and Pelvis 04/01/2020 and earlier. FINDINGS: Lower chest: Stable, negative. Hepatobiliary: Chronic cholelithiasis. The gallbladder is larger today but there is no pericholecystic inflammation. Negative noncontrast  liver. Pancreas: Negative. Spleen: Negative. Adrenals/Urinary Tract: Normal adrenal glands. Nonobstructed kidneys. 2-3 mm right upper pole nephrolithiasis. Questionable punctate left nephrolithiasis. Scattered small areas of renal cortical scarring appear stable. Proximal ureters are decompressed. Diminutive and unremarkable bladder. Stomach/Bowel: Redundant distal large bowel with some retained stool. Subtle 7 cm segment of sigmoid colon inflammation suspected on series 2, image 73 with mild sigmoid wall thickening and an indistinct appearance of the wall. No regional diverticula. No mesenteric inflammation. Upstream descending colon is within normal limits. Mildly redundant transverse colon. Negative right colon. Diminutive or absent appendix. Decompressed and negative terminal ileum. No dilated small bowel. Negative stomach and duodenum. No free air or free fluid. Stable postoperative changes to the lower midline abdominal wall. Vascular/Lymphatic: Extensive Aortoiliac calcified atherosclerosis. Chronic postoperative changes overlying the right common femoral artery with bilateral proximal femoral vascular stents. Vascular patency is not evaluated in the absence of IV contrast. No lymphadenopathy. Reproductive: Absent uterus.  Diminutive or absent ovaries. Other: No pelvic free fluid. Small fat containing inguinal hernias are stable. Musculoskeletal: Stable visualized osseous structures. Chronic lumbar compression fractures, L4 previously augmented. Previous bilateral sacroplasty. Previous left femur ORIF. IMPRESSION: 1. Subtle wall thickening of a 7 cm segment of the sigmoid colon suspicious for mild colitis in this setting. No associated  diverticula. 2. No other acute or inflammatory process identified in the non-contrast abdomen or pelvis. Chronic cholelithiasis. Right nephrolithiasis. Aortic Atherosclerosis (ICD10-I70.0). Bilateral proximal femoral artery stents. Electronically Signed   By: Odessa Fleming M.D.   On:  08/11/2020 07:11     Assessment/Plan Principal Problem:   Colitis Active Problems:   Benign essential hypertension   Peripheral vascular disease, unspecified (HCC)   Atherosclerosis of artery of extremity with intermittent claudication (HCC)   DM type 2 with diabetic peripheral neuropathy (HCC)   Type 2 diabetes mellitus with vascular disease (HCC)   Anemia of chronic kidney failure, stage 3 (moderate) (HCC)   Stage 3b chronic kidney disease (HCC)   Rectal bleeding   AKI (acute kidney injury) (HCC)      Colitis Patient presents to the emergency room for evaluation of abdominal discomfort mostly in the right lower quadrant associated with bright red blood per rectum and black stools on the day of admission Stool studies yielded Campylobacter and norovirus Place patient on enteric precautions Treat empirically with azithromycin Hold aspirin and Xarelto for now Monitor hemoglobin    Hypothyroidism Continue Synthroid   Hypertension Hold amlodipine and metoprolol for now since patient is normotensive    Acute kidney injury At baseline patient has a serum creatinine of 0.9 and today on admission it is 2.15 She was recently placed on Bactrim for UTI which will be discontinued. Hold Lasix as well as other nephrotoxic agents Gentle IV fluid hydration Repeat renal parameters in a.m.     Depression and anxiety Continue Lexapro, Xanax and Remeron    Diabetes mellitus with stage III chronic kidney disease/peripheral vascular disease Place patient on a full liquid diet Glycemic control with sliding scale insulin    Peripheral vascular disease Will hold Xarelto and aspirin due to rectal bleeding Hold metoprolol since patient is normotensive    DVT prophylaxis: SCD Code Status: full code  Family Communication: Greater than 50% of time was spent discussing patient's condition and plan of care with her at the bedside.  All questions and concerns have been addressed.   She verbalizes understanding and agrees to the plan.   Disposition Plan: Back to previous home environment Consults called: Gastroenterology Status: Observation    Halley Kincer MD [facility]     08/11/2020, 12:43 PM

## 2020-08-11 NOTE — ED Provider Notes (Signed)
Has been closed patient proximately 0 700.  Please obtain providers note for full details regarding patient's initial evaluation assessment.  In brief patient presents for assessment of some bright red blood per rectum over the last couple of days when wiping.  She is also had some mild left lower quadrant abdominal tenderness.  No nausea, vomiting, fevers or diarrhea.  Initial work-up is reassuring with hemoglobin at baseline and kidney function) was a month ago.  Plan is to follow-up CT to see if patient could possibly have bleeding diverticulitis.  CT shows likely colitis.  I suspect likely an infectious colitis with ischemic less on the differential given patient states her pain is quite minimal and stating she has compliance with her Xarelto and with recent CT earlier this year showing calcifications of the aorta and branches but no significant stenosis.  In addition patient is on Xarelto for PVD .  GI bleed patient is septic on review of records it seems that she has not recovered from AKI on CKD she sustained last month.  She has been borderline low blood pressures here and on review of records it is usually normotensive.  Patient age, anticoagulated status, kidney function and soft blood pressures we will plan admit to medicine service for observation.  Lower suspicion for ischemic colitis, will send lactate and hydrate at this time as well.   Gilles Chiquito, MD 08/11/20 [phone]

## 2020-08-12 ENCOUNTER — Other Ambulatory Visit: Payer: Self-pay | Admitting: Physician Assistant

## 2020-08-12 ENCOUNTER — Encounter (INDEPENDENT_AMBULATORY_CARE_PROVIDER_SITE_OTHER): Payer: Medicare Other | Admitting: Ophthalmology

## 2020-08-12 DIAGNOSIS — A0811 Acute gastroenteropathy due to Norwalk agent: Secondary | ICD-10-CM | POA: Diagnosis present

## 2020-08-12 DIAGNOSIS — R7401 Elevation of levels of liver transaminase levels: Secondary | ICD-10-CM | POA: Diagnosis not present

## 2020-08-12 DIAGNOSIS — Z87440 Personal history of urinary (tract) infections: Secondary | ICD-10-CM | POA: Diagnosis not present

## 2020-08-12 DIAGNOSIS — I70213 Atherosclerosis of native arteries of extremities with intermittent claudication, bilateral legs: Secondary | ICD-10-CM | POA: Diagnosis present

## 2020-08-12 DIAGNOSIS — E1142 Type 2 diabetes mellitus with diabetic polyneuropathy: Secondary | ICD-10-CM | POA: Diagnosis present

## 2020-08-12 DIAGNOSIS — F32A Depression, unspecified: Secondary | ICD-10-CM | POA: Diagnosis present

## 2020-08-12 DIAGNOSIS — K529 Noninfective gastroenteritis and colitis, unspecified: Secondary | ICD-10-CM | POA: Diagnosis present

## 2020-08-12 DIAGNOSIS — Z7901 Long term (current) use of anticoagulants: Secondary | ICD-10-CM | POA: Diagnosis not present

## 2020-08-12 DIAGNOSIS — A0472 Enterocolitis due to Clostridium difficile, not specified as recurrent: Secondary | ICD-10-CM | POA: Diagnosis present

## 2020-08-12 DIAGNOSIS — K625 Hemorrhage of anus and rectum: Secondary | ICD-10-CM

## 2020-08-12 DIAGNOSIS — Z8041 Family history of malignant neoplasm of ovary: Secondary | ICD-10-CM | POA: Diagnosis not present

## 2020-08-12 DIAGNOSIS — A045 Campylobacter enteritis: Secondary | ICD-10-CM | POA: Diagnosis present

## 2020-08-12 DIAGNOSIS — K719 Toxic liver disease, unspecified: Secondary | ICD-10-CM | POA: Diagnosis not present

## 2020-08-12 DIAGNOSIS — E785 Hyperlipidemia, unspecified: Secondary | ICD-10-CM | POA: Diagnosis present

## 2020-08-12 DIAGNOSIS — E039 Hypothyroidism, unspecified: Secondary | ICD-10-CM | POA: Diagnosis present

## 2020-08-12 DIAGNOSIS — N179 Acute kidney failure, unspecified: Secondary | ICD-10-CM | POA: Diagnosis present

## 2020-08-12 DIAGNOSIS — E1122 Type 2 diabetes mellitus with diabetic chronic kidney disease: Secondary | ICD-10-CM | POA: Diagnosis present

## 2020-08-12 DIAGNOSIS — F419 Anxiety disorder, unspecified: Secondary | ICD-10-CM | POA: Diagnosis present

## 2020-08-12 DIAGNOSIS — I129 Hypertensive chronic kidney disease with stage 1 through stage 4 chronic kidney disease, or unspecified chronic kidney disease: Secondary | ICD-10-CM | POA: Diagnosis present

## 2020-08-12 DIAGNOSIS — D631 Anemia in chronic kidney disease: Secondary | ICD-10-CM | POA: Diagnosis present

## 2020-08-12 DIAGNOSIS — D509 Iron deficiency anemia, unspecified: Secondary | ICD-10-CM | POA: Diagnosis present

## 2020-08-12 DIAGNOSIS — N183 Chronic kidney disease, stage 3 unspecified: Secondary | ICD-10-CM | POA: Diagnosis not present

## 2020-08-12 DIAGNOSIS — K219 Gastro-esophageal reflux disease without esophagitis: Secondary | ICD-10-CM | POA: Diagnosis present

## 2020-08-12 DIAGNOSIS — M109 Gout, unspecified: Secondary | ICD-10-CM | POA: Diagnosis present

## 2020-08-12 DIAGNOSIS — E1151 Type 2 diabetes mellitus with diabetic peripheral angiopathy without gangrene: Secondary | ICD-10-CM | POA: Diagnosis present

## 2020-08-12 DIAGNOSIS — Z20822 Contact with and (suspected) exposure to covid-19: Secondary | ICD-10-CM | POA: Diagnosis present

## 2020-08-12 DIAGNOSIS — N1832 Chronic kidney disease, stage 3b: Secondary | ICD-10-CM | POA: Diagnosis present

## 2020-08-12 DIAGNOSIS — Z8719 Personal history of other diseases of the digestive system: Secondary | ICD-10-CM | POA: Diagnosis not present

## 2020-08-12 DIAGNOSIS — N3941 Urge incontinence: Secondary | ICD-10-CM

## 2020-08-12 DIAGNOSIS — E113293 Type 2 diabetes mellitus with mild nonproliferative diabetic retinopathy without macular edema, bilateral: Secondary | ICD-10-CM | POA: Diagnosis present

## 2020-08-12 LAB — CBC
HCT: 26 % — ABNORMAL LOW (ref 36.0–46.0)
Hemoglobin: 8.2 g/dL — ABNORMAL LOW (ref 12.0–15.0)
MCH: 27.9 pg (ref 26.0–34.0)
MCHC: 31.5 g/dL (ref 30.0–36.0)
MCV: 88.4 fL (ref 80.0–100.0)
Platelets: 251 10*3/uL (ref 150–400)
RBC: 2.94 MIL/uL — ABNORMAL LOW (ref 3.87–5.11)
RDW: 17.1 % — ABNORMAL HIGH (ref 11.5–15.5)
WBC: 3.7 10*3/uL — ABNORMAL LOW (ref 4.0–10.5)
nRBC: 0 % (ref 0.0–0.2)

## 2020-08-12 LAB — GLUCOSE, CAPILLARY
Glucose-Capillary: 114 mg/dL — ABNORMAL HIGH (ref 70–99)
Glucose-Capillary: 210 mg/dL — ABNORMAL HIGH (ref 70–99)
Glucose-Capillary: 107 mg/dL — ABNORMAL HIGH (ref 70–99)
Glucose-Capillary: 138 mg/dL — ABNORMAL HIGH (ref 70–99)

## 2020-08-12 LAB — BASIC METABOLIC PANEL
Anion gap: 8 (ref 5–15)
BUN: 23 mg/dL (ref 8–23)
CO2: 23 mmol/L (ref 22–32)
Calcium: 8.9 mg/dL (ref 8.9–10.3)
Chloride: 110 mmol/L (ref 98–111)
Creatinine, Ser: 1.74 mg/dL — ABNORMAL HIGH (ref 0.44–1.00)
GFR, Estimated: 30 mL/min — ABNORMAL LOW (ref >60)
Glucose, Bld: 102 mg/dL — ABNORMAL HIGH (ref 70–99)
Potassium: 4.9 mmol/L (ref 3.5–5.1)
Sodium: 141 mmol/L (ref 135–145)

## 2020-08-12 LAB — AST: AST: 50 U/L — ABNORMAL HIGH (ref 15–41)

## 2020-08-12 MED ORDER — CHLORHEXIDINE GLUCONATE CLOTH 2 % EX PADS
6.0000 | MEDICATED_PAD | Freq: Every day | CUTANEOUS | Status: DC
  Administered 2020-08-13: 6 via TOPICAL

## 2020-08-12 MED ORDER — RIVAROXABAN 20 MG PO TABS
20.0000 mg | ORAL_TABLET | Freq: Every day | ORAL | Status: DC
  Administered 2020-08-12: 20 mg via ORAL
  Filled 2020-08-12 (×2): qty 1

## 2020-08-12 NOTE — Plan of Care (Signed)
  Problem: Clinical Measurements: Goal: Ability to maintain clinical measurements within normal limits will improve Outcome: Progressing Goal: Will remain free from infection Outcome: Progressing Goal: Diagnostic test results will improve Outcome: Progressing Goal: Respiratory complications will improve Outcome: Progressing Goal: Cardiovascular complication will be avoided Outcome: Progressing   Problem: Activity: Goal: Risk for activity intolerance will decrease Outcome: Progressing   Problem: Pain Managment: Goal: General experience of comfort will improve Outcome: Progressing   Pt is involved and agrees with the plan of care. V/S stable. Denies any pain. No episodes of melatonic stool this shift. Up ad lib in her room.

## 2020-08-12 NOTE — Progress Notes (Addendum)
[facility]  - [place] [facility]   PATIENT NAME: Cheryl Leonard    MR#:  [id number]  DATE OF BIRTH:  07-22-1945  SUBJECTIVE:  came in with abdominal pain and bright red blood per rectum. She also had noted some dark colored stools. Takes Xarelto for peripheral arterial disease.  patient currently tolerating full liquid diet. Denies any diarrhea. Has me she stools without any blood. Denies any constipation. Overall feeling better.  REVIEW OF SYSTEMS:   Review of Systems  Constitutional:  Negative for chills, fever and weight loss.  HENT:  Negative for ear discharge, ear pain and nosebleeds.   Eyes:  Negative for blurred vision, pain and discharge.  Respiratory:  Negative for sputum production, shortness of breath, wheezing and stridor.   Cardiovascular:  Negative for chest pain, palpitations, orthopnea and PND.  Gastrointestinal:  Negative for abdominal pain, diarrhea, nausea and vomiting.  Genitourinary:  Negative for frequency and urgency.  Musculoskeletal:  Negative for back pain and joint pain.  Neurological:  Negative for sensory change, speech change, focal weakness and weakness.  Psychiatric/Behavioral:  Negative for depression and hallucinations. The patient is not nervous/anxious.   Tolerating Diet:yes Tolerating PT: not needed  DRUG ALLERGIES:   Allergies  Allergen Reactions   Ace Inhibitors Other (See Comments)    VITALS:  Blood pressure (!) 130/52, pulse 63, temperature 98.4 F (36.9 C), temperature source Oral, resp. rate 18, height 5' 2.99" (1.6 m), weight 77.1 kg, SpO2 100 %.  PHYSICAL EXAMINATION:   Physical Exam  GENERAL:  75 y.o.-year-old patient lying in the bed with no acute distress.  LUNGS: Normal breath sounds bilaterally, no wheezing, rales, rhonchi. No use of accessory muscles of respiration.  CARDIOVASCULAR: S1, S2 normal. No murmurs, rubs, or gallops.  ABDOMEN: Soft, nontender, nondistended. Bowel sounds present. No  organomegaly or mass.  EXTREMITIES: No cyanosis, clubbing or edema b/l.    NEUROLOGIC: nonfocal PSYCHIATRIC:  patient is alert and oriented x 3.  SKIN: No obvious rash, lesion, or ulcer.   LABORATORY PANEL:  CBC Recent Labs  Lab 08/12/20 0409  WBC 3.7*  HGB 8.2*  HCT 26.0*  PLT 251    Chemistries  Recent Labs  Lab 08/11/20 0130 08/12/20 0409  NA 135 141  K 5.0 4.9  CL 106 110  CO2 25 23  GLUCOSE 99 102*  BUN 33* 23  CREATININE 2.15* 1.74*  CALCIUM 8.8* 8.9  AST 48*  --   ALT 30  --   ALKPHOS 45  --   BILITOT 0.7  --    Cardiac Enzymes No results for input(s): TROPONINI in the last 168 hours. RADIOLOGY:  CT ABDOMEN PELVIS WO CONTRAST  Result Date: 08/11/2020 CLINICAL DATA:  75 year old female with left lower quadrant abdominal pain and episodes of bloody stools over the past 4 days. EXAM: CT ABDOMEN AND PELVIS WITHOUT CONTRAST TECHNIQUE: Multidetector CT imaging of the abdomen and pelvis was performed following the standard protocol without IV contrast. COMPARISON:  CT Abdomen and Pelvis 04/01/2020 and earlier. FINDINGS: Lower chest: Stable, negative. Hepatobiliary: Chronic cholelithiasis. The gallbladder is larger today but there is no pericholecystic inflammation. Negative noncontrast liver. Pancreas: Negative. Spleen: Negative. Adrenals/Urinary Tract: Normal adrenal glands. Nonobstructed kidneys. 2-3 mm right upper pole nephrolithiasis. Questionable punctate left nephrolithiasis. Scattered small areas of renal cortical scarring appear stable. Proximal ureters are decompressed. Diminutive and unremarkable bladder. Stomach/Bowel: Redundant distal large bowel with some retained stool. Subtle 7 cm segment of sigmoid colon inflammation suspected on series  2, image 73 with mild sigmoid wall thickening and an indistinct appearance of the wall. No regional diverticula. No mesenteric inflammation. Upstream descending colon is within normal limits. Mildly redundant transverse colon.  Negative right colon. Diminutive or absent appendix. Decompressed and negative terminal ileum. No dilated small bowel. Negative stomach and duodenum. No free air or free fluid. Stable postoperative changes to the lower midline abdominal wall. Vascular/Lymphatic: Extensive Aortoiliac calcified atherosclerosis. Chronic postoperative changes overlying the right common femoral artery with bilateral proximal femoral vascular stents. Vascular patency is not evaluated in the absence of IV contrast. No lymphadenopathy. Reproductive: Absent uterus.  Diminutive or absent ovaries. Other: No pelvic free fluid. Small fat containing inguinal hernias are stable. Musculoskeletal: Stable visualized osseous structures. Chronic lumbar compression fractures, L4 previously augmented. Previous bilateral sacroplasty. Previous left femur ORIF. IMPRESSION: 1. Subtle wall thickening of a 7 cm segment of the sigmoid colon suspicious for mild colitis in this setting. No associated diverticula. 2. No other acute or inflammatory process identified in the non-contrast abdomen or pelvis. Chronic cholelithiasis. Right nephrolithiasis. Aortic Atherosclerosis (ICD10-I70.0). Bilateral proximal femoral artery stents. Electronically Signed   By: Odessa Fleming M.D.   On: 08/11/2020 07:11   ASSESSMENT AND PLAN:  Cheryl Leonard is a 75 y.o. female with medical history significant for peripheral artery disease on Xarelto followed by vascular surgery, essential hypertension, type 2 diabetes with CKD 3B, chronic normocytic anemia, who presented to [facility] ED for evaluation of bright red blood per rectum associated with abdominal discomfort and fatigue. Patient states that her symptoms started 4 days ago and states that she had a bowel movement after which she passed bright red blood.  She denies having any constipation and did not strain to have a bowel movement.  She does not have a history of hemorrhoids.  Stool studies are positive for C. difficile  antigen, Campylobacter and norovirus were detected as well   Acute colitis suspected due to Campylobacter/norovirus -- presents with abdominal pain and bright red blood per rectum with black stools on the day of admission -- hemoglobin is stable at 8.5 -- patient has history of chronic anemia -- stool studies positive for C. difficile antigen, toxin negative -- G.I. PCR Campylobacter and norovirus-- likely source for her infection -- patient tells me she has not had any more blood in her stools -- denies constipation -- will resume her Xarelto-- discussed with G.I. Dr. Maximino Greenland -- patient has had EGD and colonoscopy in March 2021. -- she will get her small bowel capsule endoscopy study as outpatient with Dr. Rozanna Box I suspect prerenal -- came in with creatinine of 2.15--- 1.74 -- patient encouraged to take PO fluids -- baseline creatinine 0.9  Hypothyroidism -- continue Synthroid  Hypertension will resume metoprolol and amlodipine  type II diabetes with CKD stage III and chronic peripheral vascular disease -- resume home meds at discharge -- sliding scale insulin  peripheral vascular disease -- on Xarelto and aspirin-- resume now that bleeding has resolved  Procedures: Family communication : daughter Dewayne Hatch on the phone Consults : G.I. CODE STATUS: full DVT Prophylaxis : Xarelto Level of care: Med-Surg Status is: Inpatient  Remains inpatient appropriate because:Inpatient level of care appropriate due to severity of illness  Dispo: The patient is from: Home              Anticipated d/c is to: Home              Patient currently is not medically stable to  d/c.   Difficult to place patient No  overall patient improving clinically. Continues to show improvement will likely discharge home tomorrow. Patient and family in agreement. G.I. in agreement.      TOTAL TIME TAKING CARE OF THIS PATIENT: 30 minutes.  >50% time spent on counselling and coordination of  care  Note: This dictation was prepared with Dragon dictation along with smaller phrase technology. Any transcriptional errors that result from this process are unintentional.  Enedina Finner M.D    [facility]   CC: Primary care physician; Danella Penton, MD Patient ID: Lollie Sails Puthoff, female   DOB: 08-Sep-1945, 75 y.o.   MRN: [id number]

## 2020-08-12 NOTE — Progress Notes (Addendum)
[name], MD [address], [place], [place], [address] [address], [place], [place], [address] Phone: [phone]  Fax: [phone]   Subjective:  Patient laying in bed comfortably.  Denies any abdominal pain.  Has not had any further blood in stool.  No nausea or vomiting.  Tolerating oral diet.  Objective: Exam: Vital signs in last 24 hours: Vitals:   08/12/20 0400 08/12/20 0746 08/12/20 1520 08/12/20 1554  BP: 104/76 (!) 117/58 (!) 121/56 (!) 130/52  Pulse: 70 65 67 63  Resp: Temp: 98.5 F (36.9 C) 98.7 F (37.1 C) 98.4 F (36.9 C) 98.4 F (36.9 C)  TempSrc: Oral Oral Oral Oral  SpO2: (!) 86% 100% 95% 100%  Weight:      Height:       Weight change: 2.268 kg  Intake/Output Summary (Last 24 hours) at 08/12/2020 1641 Last data filed at 08/12/2020 1001 Gross per 24 hour  Intake 490 ml  Output --  Net 490 ml    Constitutional: General:   Alert,  Well-developed, well-nourished, pleasant and cooperative in NAD BP (!) 130/52 (BP Location: Right Arm)   Pulse 63   Temp 98.4 F (36.9 C) (Oral)   Resp 18   Ht 5' 2.99" (1.6 m)   Wt 77.1 kg   SpO2 100%   BMI 30.12 kg/m   Eyes:  Sclera clear, no icterus.   Conjunctiva pink.   Ears:  No scars, lesions or masses, Normal auditory acuity. Nose:  No deformity, discharge, or lesions. Mouth:  No deformity or lesions, oropharynx pink & moist.  Neck:  Supple; no masses, trachea midline  Respiratory: Normal respiratory effort  Gastrointestinal:  Soft, non-tender and non-distended without masses, hepatosplenomegaly or hernias noted.  No guarding or rebound tenderness.     Cardiac: No clubbing or edema.  No cyanosis. Normal posterior tibial pedal pulses noted.  Lymphatic:  No significant cervical adenopathy.  Psych:  Alert and cooperative. Normal mood and affect.  Musculoskeletal:  Head normocephalic, atraumatic. 5/5 Lower extremity strength bilaterally.  Skin: Warm.  Intact without significant lesions or rashes. No jaundice.  Neurologic:  Face symmetrical, tongue midline, Normal sensation to touch  Psych:  Alert and oriented x3, Alert and cooperative. Normal mood and affect.   Lab Results: Lab Results  Component Value Date   WBC 3.7 (L) 08/12/2020   HGB 8.2 (L) 08/12/2020   HCT 26.0 (L) 08/12/2020   MCV 88.4 08/12/2020   PLT 251 08/12/2020   Micro Results: Recent Results (from the past 240 hour(s))  Microscopic Examination     Status: Abnormal   Collection Time: 08/04/20 10:15 AM   Urine  Result Value Ref Range Status   WBC, UA 11-30 (A) 0 - 5 /hpf Final   RBC 0-2 0 - 2 /hpf Final   Epithelial Cells (non renal) 0-10 0 - 10 /hpf Final   Casts Present (A) None seen /lpf Final   Cast Type Granular casts (A) N/A Final   Bacteria, UA Many (A) None seen/Few Final  CULTURE, URINE COMPREHENSIVE     Status: Abnormal   Collection Time: 08/04/20 11:13 AM   Specimen: Urine   UR  Result Value Ref Range Status   Urine Culture, Comprehensive Final report (A)  Final   Organism ID, Bacteria Klebsiella pneumoniae (A)  Final    Comment: Cefazolin <=4 ug/mL Cefazolin with an MIC <=16 predicts susceptibility to the oral agents cefaclor, cefdinir, cefpodoxime, cefprozil, cefuroxime,  cephalexin, and loracarbef when used for therapy of uncomplicated urinary tract infections due to E. coli, Klebsiella pneumoniae, and Proteus mirabilis. Greater than 100,000 colony forming units per mL    ANTIMICROBIAL SUSCEPTIBILITY Comment  Final    Comment:       ** S = Susceptible; I = Intermediate; R = Resistant **                    P = Positive; N = Negative             MICS are expressed in micrograms per mL    Antibiotic                 RSLT#1    RSLT#2    RSLT#3    RSLT#4 Amoxicillin/Clavulanic Acid    S Ampicillin                     R Cefepime                       S Ceftriaxone                    S Cefuroxime                     S Ciprofloxacin                   S Ertapenem                      S Gentamicin                     S Imipenem                       S Levofloxacin                   S Meropenem                      S Nitrofurantoin                 I Piperacillin/Tazobactam        S Tetracycline                   S Tobramycin                     S Trimethoprim/Sulfa             S   Resp Panel by RT-PCR (Flu A&B, Covid) Nasopharyngeal Swab     Status: None   Collection Time: 08/11/20  8:22 AM   Specimen: Nasopharyngeal Swab; Nasopharyngeal(NP) swabs in vial transport medium  Result Value Ref Range Status   SARS Coronavirus 2 by RT PCR NEGATIVE NEGATIVE Final    Comment: (NOTE) SARS-CoV-2 target nucleic acids are NOT DETECTED.  The SARS-CoV-2 RNA is generally detectable in upper respiratory specimens during the acute phase of infection. The lowest concentration of SARS-CoV-2 viral copies this assay can detect is 138 copies/mL. A negative result does not preclude SARS-Cov-2 infection and should not be used as the sole basis for treatment or other patient management decisions. A negative result may occur with  improper specimen collection/handling, submission of specimen other than nasopharyngeal swab, presence of viral mutation(s) within the areas targeted by this assay, and inadequate number of viral copies(<138 copies/mL).  A negative result must be combined with clinical observations, patient history, and epidemiological information. The expected result is Negative.  Fact Sheet for Patients:  BloggerCourse.com  Fact Sheet for Healthcare Providers:  SeriousBroker.it  This test is no t yet approved or cleared by the [place] FDA and  has been authorized for detection and/or diagnosis of SARS-CoV-2 by FDA under an Emergency Use Authorization (EUA). This EUA will remain  in effect (meaning this test can be used) for the duration of the COVID-19 declaration under Section  564(b)(1) of the Act, 21 U.S.C.section 360bbb-3(b)(1), unless the authorization is terminated  or revoked sooner.       Influenza A by PCR NEGATIVE NEGATIVE Final   Influenza B by PCR NEGATIVE NEGATIVE Final    Comment: (NOTE) The Xpert Xpress SARS-CoV-2/FLU/RSV plus assay is intended as an aid in the diagnosis of influenza from Nasopharyngeal swab specimens and should not be used as a sole basis for treatment. Nasal washings and aspirates are unacceptable for Xpert Xpress SARS-CoV-2/FLU/RSV testing.  Fact Sheet for Patients: BloggerCourse.com  Fact Sheet for Healthcare Providers: SeriousBroker.it  This test is not yet approved or cleared by the [place] FDA and has been authorized for detection and/or diagnosis of SARS-CoV-2 by FDA under an Emergency Use Authorization (EUA). This EUA will remain in effect (meaning this test can be used) for the duration of the COVID-19 declaration under Section 564(b)(1) of the Act, 21 U.S.C. section 360bbb-3(b)(1), unless the authorization is terminated or revoked.  Performed at [facility], [address] [place], [place] [address]   Gastrointestinal Panel by PCR , Stool     Status: Abnormal   Collection Time: 08/11/20  8:22 AM   Specimen: Stool  Result Value Ref Range Status   Campylobacter species DETECTED (A) NOT DETECTED Final    Comment: RESULT CALLED TO, READ BACK BY AND VERIFIED WITH: [place] NICKERSON 08/11/20 1150 KLW    Plesimonas shigelloides NOT DETECTED NOT DETECTED Final   Salmonella species NOT DETECTED NOT DETECTED Final   Yersinia enterocolitica NOT DETECTED NOT DETECTED Final   Vibrio species NOT DETECTED NOT DETECTED Final   Vibrio cholerae NOT DETECTED NOT DETECTED Final   Enteroaggregative E coli (EAEC) NOT DETECTED NOT DETECTED Final   Enteropathogenic E coli (EPEC) NOT DETECTED NOT DETECTED Final   Enterotoxigenic E coli (ETEC) NOT DETECTED NOT  DETECTED Final   Shiga like toxin producing E coli (STEC) NOT DETECTED NOT DETECTED Final   Shigella/Enteroinvasive E coli (EIEC) NOT DETECTED NOT DETECTED Final   Cryptosporidium NOT DETECTED NOT DETECTED Final   Cyclospora cayetanensis NOT DETECTED NOT DETECTED Final   Entamoeba histolytica NOT DETECTED NOT DETECTED Final   Giardia lamblia NOT DETECTED NOT DETECTED Final   Adenovirus F40/41 NOT DETECTED NOT DETECTED Final   Astrovirus NOT DETECTED NOT DETECTED Final   Norovirus GI/GII DETECTED (A) NOT DETECTED Final    Comment: RESULT CALLED TO, READ BACK BY AND VERIFIED WITH: [place] NICKERSON 08/11/20 1150 KLW    Rotavirus A NOT DETECTED NOT DETECTED Final   Sapovirus (I, II, IV, and V) NOT DETECTED NOT DETECTED Final    Comment: Performed at [facility], [address] [place], [place] [address]  C Difficile Quick Screen w PCR reflex     Status: Abnormal   Collection Time: 08/11/20  8:22 AM   Specimen: Stool  Result Value Ref Range Status   C Diff antigen POSITIVE (A) NEGATIVE Final   C Diff toxin NEGATIVE NEGATIVE  Final   C Diff interpretation Results are indeterminate. See PCR results.  Final    Comment: Performed at [facility], [address] [place], [place] [address]  C. Diff by PCR, Reflexed     Status: None   Collection Time: 08/11/20  8:22 AM  Result Value Ref Range Status   Toxigenic C. Difficile by PCR NEGATIVE NEGATIVE Final    Comment: Patient is colonized with non toxigenic C. difficile. May not need treatment unless significant symptoms are present. Performed at [facility], [address] [place], [place] [address]    Studies/Results: CT ABDOMEN PELVIS WO CONTRAST  Result Date: 08/11/2020 CLINICAL DATA:  75 year old female with left lower quadrant abdominal pain and episodes of bloody stools over the past 4 days. EXAM: CT ABDOMEN AND PELVIS WITHOUT CONTRAST TECHNIQUE: Multidetector CT imaging of the abdomen and pelvis was  performed following the standard protocol without IV contrast. COMPARISON:  CT Abdomen and Pelvis 04/01/2020 and earlier. FINDINGS: Lower chest: Stable, negative. Hepatobiliary: Chronic cholelithiasis. The gallbladder is larger today but there is no pericholecystic inflammation. Negative noncontrast liver. Pancreas: Negative. Spleen: Negative. Adrenals/Urinary Tract: Normal adrenal glands. Nonobstructed kidneys. 2-3 mm right upper pole nephrolithiasis. Questionable punctate left nephrolithiasis. Scattered small areas of renal cortical scarring appear stable. Proximal ureters are decompressed. Diminutive and unremarkable bladder. Stomach/Bowel: Redundant distal large bowel with some retained stool. Subtle 7 cm segment of sigmoid colon inflammation suspected on series 2, image 73 with mild sigmoid wall thickening and an indistinct appearance of the wall. No regional diverticula. No mesenteric inflammation. Upstream descending colon is within normal limits. Mildly redundant transverse colon. Negative right colon. Diminutive or absent appendix. Decompressed and negative terminal ileum. No dilated small bowel. Negative stomach and duodenum. No free air or free fluid. Stable postoperative changes to the lower midline abdominal wall. Vascular/Lymphatic: Extensive Aortoiliac calcified atherosclerosis. Chronic postoperative changes overlying the right common femoral artery with bilateral proximal femoral vascular stents. Vascular patency is not evaluated in the absence of IV contrast. No lymphadenopathy. Reproductive: Absent uterus.  Diminutive or absent ovaries. Other: No pelvic free fluid. Small fat containing inguinal hernias are stable. Musculoskeletal: Stable visualized osseous structures. Chronic lumbar compression fractures, L4 previously augmented. Previous bilateral sacroplasty. Previous left femur ORIF. IMPRESSION: 1. Subtle wall thickening of a 7 cm segment of the sigmoid colon suspicious for mild colitis in this  setting. No associated diverticula. 2. No other acute or inflammatory process identified in the non-contrast abdomen or pelvis. Chronic cholelithiasis. Right nephrolithiasis. Aortic Atherosclerosis (ICD10-I70.0). Bilateral proximal femoral artery stents. Electronically Signed   By: Odessa FlemingH  Hall M.D.   On: 08/11/2020 07:11   Medications:  Scheduled Meds:  aspirin EC  81 mg Oral Daily   Chlorhexidine Gluconate Cloth  6 each Topical Daily   cholecalciferol  1,000 Units Oral BID   escitalopram  10 mg Oral Daily   gabapentin  100 mg Oral BID   insulin aspart  0-15 Units Subcutaneous TID WC   levothyroxine  100 mcg Oral QAC breakfast   magnesium oxide  400 mg Oral Daily   multivitamin with minerals  1 tablet Oral Daily   pantoprazole  40 mg Oral QAC breakfast   rivaroxaban  20 mg Oral Q supper   vitamin B-12  100 mcg Oral Daily   Continuous Infusions:  azithromycin 500 mg (08/12/20 1001)   PRN Meds:.acetaminophen **OR** acetaminophen, ALPRAZolam, lidocaine-prilocaine, ondansetron **OR** ondansetron (ZOFRAN) IV   Assessment: Principal Problem:   Colitis Active Problems:  Benign essential hypertension   Peripheral vascular disease, unspecified (HCC)   Atherosclerosis of artery of extremity with intermittent claudication (HCC)   DM type 2 with diabetic peripheral neuropathy (HCC)   Type 2 diabetes mellitus with vascular disease (HCC)   Anemia of chronic kidney failure, stage 3 (moderate) (HCC)   Stage 3b chronic kidney disease (HCC)   Rectal bleeding   AKI (acute kidney injury) (HCC)   Rectal bleed    Plan: Colitis: Patient's presenting symptoms have improved at this time, with no further blood in stool.    Given resolution in abdominal pain and blood in stool, no indication for colonoscopy at this time  Patient's daughter is a family nurse practitioner and had some questions, that I was able to discuss with her over the phone in the presence of patient in her room over speaker phone.   Patient and family specifically asked about small bowel capsule study as a stated [facility] GI had planned on doing this.  I discussed with them that since her presenting symptoms of blood in stool have resolved and the etiology of this was likely due to her colitis from Campylobacter/norovirus, small bowel capsule study at this time is not recommended.  However, this would be appropriate to be done in the near future upon follow-up with her primary GI and I have encouraged them to make this appointment at the time of discharge and they verbalized understanding.  Dr. Allena Katz at bedside today and she states Xarelto was initially held on admission, but she plans on restarting this since no colonoscopy is planned at this time.  This is reasonable.  However, if symptoms recur, or anemia worsens please page GI for further evaluation  Elevated liver enzymes: AST repeated today and still mildly elevated to 50.  However, ALT has been normal.  Possibly due to drug-induced liver injury from recent Bactrim use, versus from recent dehydration due to diarrhea (although ischemic hepatitis would be expected to have a more significant increase in liver enzymes).  Given other liver enzymes are normal, would recommend repeat liver enzymes in 1 to 2 weeks and if still abnormal, obtain further work-up.  No other features of hepatitis or any other concerning liver injury  Avoid hepatotoxic drugs Avoid hypotension  GI service will sign off at this time.  Please page with any questions or concerns   LOS: 0 days   [name], MD 08/12/2020, 4:41 PM

## 2020-08-13 ENCOUNTER — Inpatient Hospital Stay: Payer: Medicare Other

## 2020-08-13 LAB — GLUCOSE, CAPILLARY
Glucose-Capillary: 131 mg/dL — ABNORMAL HIGH (ref 70–99)
Glucose-Capillary: 312 mg/dL — ABNORMAL HIGH (ref 70–99)
Glucose-Capillary: 283 mg/dL — ABNORMAL HIGH (ref 70–99)

## 2020-08-13 MED ORDER — AZITHROMYCIN 250 MG PO TABS
500.0000 mg | ORAL_TABLET | Freq: Once | ORAL | Status: AC
  Administered 2020-08-13: 500 mg via ORAL
  Filled 2020-08-13: qty 2

## 2020-08-13 NOTE — Discharge Summary (Signed)
[facility] [place] [facility]   PATIENT NAME: Cheryl Leonard    MR#:  [id number]  DATE OF BIRTH:  1945/08/11  DATE OF ADMISSION:  08/11/2020 ADMITTING PHYSICIAN: Cheryl Finner, MD  DATE OF DISCHARGE: 08/13/2020  PRIMARY CARE PHYSICIAN: Cheryl Penton, MD    ADMISSION DIAGNOSIS:  Colitis [K52.9] Rectal bleeding [K62.5] Lower abdominal pain [R10.30] Rectal bleed [K62.5]  DISCHARGE DIAGNOSIS:  Colitis--Camplybacter/Rotavirus  SECONDARY DIAGNOSIS:   Past Medical History:  Diagnosis Date   Anemia    Anxiety    Arthritis    Gout   Cataracts, both eyes    Diabetic retinopathy (HCC)    NPDR OU   Diabetic retinopathy (HCC)    GERD (gastroesophageal reflux disease)    Gout    Headache    h/o migraines   History of fracture of patella    right knee   History of positive PPD    Patient always shows positive   Hyperlipidemia    Hypertension    Hypertensive retinopathy    OU   Hypothyroidism    Lichen sclerosus 12/30/2013   of vulva   Metatarsal fracture    Neuropathy    Peripheral vascular disease (HCC)    Polyneuropathy    numbness and tingling in feet and toes   Renal insufficiency    Stage 3   Sleep apnea    does not use cpap-lost weight    Type 2 diabetes mellitus, uncontrolled (HCC)     HOSPITAL COURSE:   Cheryl Leonard is a 74 y.o. female with medical history significant for peripheral artery disease on Xarelto followed by vascular surgery, essential hypertension, type 2 diabetes with CKD 3B, chronic normocytic anemia, who presented to [facility] ED for evaluation of bright red blood per rectum associated with abdominal discomfort and fatigue. Patient states that her symptoms started 4 days ago and states that she had a bowel movement after which she passed bright red blood.  She denies having any constipation and did not strain to have a bowel movement.  She does not have a history of hemorrhoids.   Stool studies are positive for C.  difficile antigen, Campylobacter and norovirus were detected as well     Acute colitis suspected due to Campylobacter/norovirus -- presents with abdominal pain and bright red blood per rectum with black stools on the day of admission -- hemoglobin is stable at 8.5 -- patient has history of chronic anemia -- stool studies positive for C. difficile antigen, toxin negative -- G.I. PCR Campylobacter and norovirus-- likely source for her infection -- patient tells me she has not had any more blood in her stools -- denies constipation -- will resume her Xarelto-- discussed with G.I. Dr. Maximino Leonard -- patient has had EGD and colonoscopy in March 2021. -- she will get her small bowel capsule endoscopy study as outpatient with Dr. Rozanna Leonard I suspect prerenal -- came in with creatinine of 2.15--- 1.74 -- patient encouraged to take PO fluids -- baseline creatinine 0.9   Hypothyroidism -- continue Synthroid   Hypertension will resume metoprolol and amlodipine   type II diabetes with CKD stage III and chronic peripheral vascular disease -- resume home meds at discharge -- sliding scale insulin   peripheral vascular disease -- on Xarelto and aspirin-- resume now that bleeding has resolved   Family communication : daughter Cheryl Leonard on the phone Consults : G.I. CODE STATUS: full DVT Prophylaxis : Xarelto Level of care: Med-Surg Status is: Inpatient  Remains inpatient appropriate because:Inpatient level of care appropriate due to severity of illness   Dispo: The patient is from: Home              Anticipated d/c is to: Home              Patient currently is medically stable to d/c.              Difficult to place patient No   overall patient improving clinically. Ok to discharge. CONSULTS OBTAINED:    DRUG ALLERGIES:   Allergies  Allergen Reactions   Ace Inhibitors Other (See Comments)    DISCHARGE MEDICATIONS:   Allergies as of 08/13/2020       Reactions   Ace Inhibitors  Other (See Comments)        Medication List     STOP taking these medications    gabapentin 100 MG capsule Commonly known as: NEURONTIN   sulfamethoxazole-trimethoprim 800-160 MG tablet Commonly known as: BACTRIM DS       TAKE these medications    ALPRAZolam 0.25 MG tablet Commonly known as: XANAX Take 0.25 mg by mouth daily as needed for anxiety or sleep.   amLODipine 5 MG tablet Commonly known as: NORVASC Take 5 mg by mouth daily.   aspirin EC 81 MG tablet Take 81 mg by mouth daily.   cholecalciferol 1000 units tablet Commonly known as: VITAMIN D Take 1,000 Units by mouth 2 (two) times daily.   CORAL CALCIUM PO Take 1 tablet by mouth 2 (two) times daily.   denosumab 60 MG/ML Soln injection Commonly known as: PROLIA Inject 60 mg into the skin every 6 (six) months.   escitalopram 10 MG tablet Commonly known as: LEXAPRO Take 10 mg by mouth daily.   estradiol 0.1 MG/GM vaginal cream Commonly known as: ESTRACE Apply one pea-sized amount around the opening of the urethra daily for two weeks, then three times weekly thereafter.   famotidine 40 MG tablet Commonly known as: PEPCID Take 40 mg by mouth daily.   furosemide 20 MG tablet Commonly known as: LASIX Take 20 mg by mouth daily as needed for fluid or edema.   levothyroxine 100 MCG tablet Commonly known as: SYNTHROID Take 100 mcg by mouth daily before breakfast.   lidocaine-prilocaine cream Commonly known as: EMLA Apply 1 application topically as needed (apply prior to port a cath access).   Magnesium 500 MG Tabs Take 500 mg by mouth every morning.   metFORMIN 1000 MG tablet Commonly known as: GLUCOPHAGE Take 1 tablet by mouth 2 (two) times daily with a meal.   metoprolol succinate 50 MG 24 hr tablet Commonly known as: TOPROL-XL Take 50 mg by mouth daily.   mirtazapine 15 MG tablet Commonly known as: REMERON Take 15 mg by mouth at bedtime.   multivitamin with minerals Tabs tablet Take 1  tablet by mouth daily.   nystatin cream Commonly known as: MYCOSTATIN Apply 1 application topically 2 (two) times daily.   olmesartan 20 MG tablet Commonly known as: BENICAR Take 20 mg by mouth daily.   pantoprazole 40 MG tablet Commonly known as: PROTONIX Take 40 mg by mouth every morning.   rivaroxaban 20 MG Tabs tablet Commonly known as: XARELTO Take 1 tablet (20 mg total) by mouth daily with supper.   rosuvastatin 20 MG tablet Commonly known as: CRESTOR Take 20 mg by mouth every morning.   Evaristo Bury FlexTouch 200 UNIT/ML FlexTouch Pen Generic drug: insulin degludec Inject 30 Units as directed  at bedtime.   vitamin B-12 100 MCG tablet Commonly known as: CYANOCOBALAMIN Take 100 mcg by mouth daily.   vitamin E 180 MG (400 UNITS) capsule Take 400 Units by mouth daily.   zolpidem 10 MG tablet Commonly known as: AMBIEN Take 10 mg by mouth at bedtime as needed for sleep.        If you experience worsening of your admission symptoms, develop shortness of breath, life threatening emergency, suicidal or homicidal thoughts you must seek medical attention immediately by calling 911 or calling your MD immediately  if symptoms less severe.  You Must read complete instructions/literature along with all the possible adverse reactions/side effects for all the Medicines you take and that have been prescribed to you. Take any new Medicines after you have completely understood and accept all the possible adverse reactions/side effects.   Please note  You were cared for by a hospitalist during your hospital stay. If you have any questions about your discharge medications or the care you received while you were in the hospital after you are discharged, you can call the unit and asked to speak with the hospitalist on call if the hospitalist that took care of you is not available. Once you are discharged, your primary care physician will handle any further medical issues. Please note that NO  REFILLS for any discharge medications will be authorized once you are discharged, as it is imperative that you return to your primary care physician (or establish a relationship with a primary care physician if you do not have one) for your aftercare needs so that they can reassess your need for medications and monitor your lab values. Today   SUBJECTIVE   Doing well. No diarrhea or bloody stools  VITAL SIGNS:  Blood pressure (!) 153/70, pulse 79, temperature 98.2 F (36.8 C), temperature source Oral, resp. rate 18, height 5' 2.99" (1.6 m), weight 77.1 kg, SpO2 97 %.  I/O:  No intake or output data in the 24 hours ending 08/13/20 1142  PHYSICAL EXAMINATION:  GENERAL:  75 y.o.-year-old patient lying in the bed with no acute distress.  LUNGS: Normal breath sounds bilaterally, no wheezing, rales,rhonchi or crepitation. No use of accessory muscles of respiration.  CARDIOVASCULAR: S1, S2 normal. No murmurs, rubs, or gallops.  ABDOMEN: Soft, non-tender, non-distended. Bowel sounds present. No organomegaly or mass.  EXTREMITIES: No pedal edema, cyanosis, or clubbing.  NEUROLOGIC: non focal PSYCHIATRIC: The patient is alert and oriented x 3.  SKIN: No obvious rash, lesion, or ulcer.   DATA REVIEW:   CBC  Recent Labs  Lab 08/12/20 0409  WBC 3.7*  HGB 8.2*  HCT 26.0*  PLT 251    Chemistries  Recent Labs  Lab 08/11/20 0130 08/12/20 0409  NA 135 141  K 5.0 4.9  CL 106 110  CO2 25 23  GLUCOSE 99 102*  BUN 33* 23  CREATININE 2.15* 1.74*  CALCIUM 8.8* 8.9  AST 48* 50*  ALT 30  --   ALKPHOS 45  --   BILITOT 0.7  --     Microbiology Results   Recent Results (from the past 240 hour(s))  Microscopic Examination     Status: Abnormal   Collection Time: 08/04/20 10:15 AM   Urine  Result Value Ref Range Status   WBC, UA 11-30 (A) 0 - 5 /hpf Final   RBC 0-2 0 - 2 /hpf Final   Epithelial Cells (non renal) 0-10 0 - 10 /hpf Final   Casts Present (A)  None seen /lpf Final   Cast  Type Granular casts (A) N/A Final   Bacteria, UA Many (A) None seen/Few Final  CULTURE, URINE COMPREHENSIVE     Status: Abnormal   Collection Time: 08/04/20 11:13 AM   Specimen: Urine   UR  Result Value Ref Range Status   Urine Culture, Comprehensive Final report (A)  Final   Organism ID, Bacteria Klebsiella pneumoniae (A)  Final    Comment: Cefazolin <=4 ug/mL Cefazolin with an MIC <=16 predicts susceptibility to the oral agents cefaclor, cefdinir, cefpodoxime, cefprozil, cefuroxime, cephalexin, and loracarbef when used for therapy of uncomplicated urinary tract infections due to E. coli, Klebsiella pneumoniae, and Proteus mirabilis. Greater than 100,000 colony forming units per mL    ANTIMICROBIAL SUSCEPTIBILITY Comment  Final    Comment:       ** S = Susceptible; I = Intermediate; R = Resistant **                    P = Positive; N = Negative             MICS are expressed in micrograms per mL    Antibiotic                 RSLT#1    RSLT#2    RSLT#3    RSLT#4 Amoxicillin/Clavulanic Acid    S Ampicillin                     R Cefepime                       S Ceftriaxone                    S Cefuroxime                     S Ciprofloxacin                  S Ertapenem                      S Gentamicin                     S Imipenem                       S Levofloxacin                   S Meropenem                      S Nitrofurantoin                 I Piperacillin/Tazobactam        S Tetracycline                   S Tobramycin                     S Trimethoprim/Sulfa             S   Resp Panel by RT-PCR (Flu A&B, Covid) Nasopharyngeal Swab     Status: None   Collection Time: 08/11/20  8:22 AM   Specimen: Nasopharyngeal Swab; Nasopharyngeal(NP) swabs in vial transport medium  Result Value Ref Range Status   SARS Coronavirus 2 by RT PCR NEGATIVE NEGATIVE Final    Comment: (NOTE) SARS-CoV-2 target  nucleic acids are NOT DETECTED.  The SARS-CoV-2 RNA is generally detectable  in upper respiratory specimens during the acute phase of infection. The lowest concentration of SARS-CoV-2 viral copies this assay can detect is 138 copies/mL. A negative result does not preclude SARS-Cov-2 infection and should not be used as the sole basis for treatment or other patient management decisions. A negative result may occur with  improper specimen collection/handling, submission of specimen other than nasopharyngeal swab, presence of viral mutation(s) within the areas targeted by this assay, and inadequate number of viral copies(<138 copies/mL). A negative result must be combined with clinical observations, patient history, and epidemiological information. The expected result is Negative.  Fact Sheet for Patients:  BloggerCourse.com  Fact Sheet for Healthcare Providers:  SeriousBroker.it  This test is no t yet approved or cleared by the [place] FDA and  has been authorized for detection and/or diagnosis of SARS-CoV-2 by FDA under an Emergency Use Authorization (EUA). This EUA will remain  in effect (meaning this test can be used) for the duration of the COVID-19 declaration under Section 564(b)(1) of the Act, 21 U.S.C.section 360bbb-3(b)(1), unless the authorization is terminated  or revoked sooner.       Influenza A by PCR NEGATIVE NEGATIVE Final   Influenza B by PCR NEGATIVE NEGATIVE Final    Comment: (NOTE) The Xpert Xpress SARS-CoV-2/FLU/RSV plus assay is intended as an aid in the diagnosis of influenza from Nasopharyngeal swab specimens and should not be used as a sole basis for treatment. Nasal washings and aspirates are unacceptable for Xpert Xpress SARS-CoV-2/FLU/RSV testing.  Fact Sheet for Patients: BloggerCourse.com  Fact Sheet for Healthcare Providers: SeriousBroker.it  This test is not yet approved or cleared by the [place] FDA and has  been authorized for detection and/or diagnosis of SARS-CoV-2 by FDA under an Emergency Use Authorization (EUA). This EUA will remain in effect (meaning this test can be used) for the duration of the COVID-19 declaration under Section 564(b)(1) of the Act, 21 U.S.C. section 360bbb-3(b)(1), unless the authorization is terminated or revoked.  Performed at [facility], [address] [place], [place] [address]   Gastrointestinal Panel by PCR , Stool     Status: Abnormal   Collection Time: 08/11/20  8:22 AM   Specimen: Stool  Result Value Ref Range Status   Campylobacter species DETECTED (A) NOT DETECTED Final    Comment: RESULT CALLED TO, READ BACK BY AND VERIFIED WITH: [place] NICKERSON 08/11/20 1150 KLW    Plesimonas shigelloides NOT DETECTED NOT DETECTED Final   Salmonella species NOT DETECTED NOT DETECTED Final   Yersinia enterocolitica NOT DETECTED NOT DETECTED Final   Vibrio species NOT DETECTED NOT DETECTED Final   Vibrio cholerae NOT DETECTED NOT DETECTED Final   Enteroaggregative E coli (EAEC) NOT DETECTED NOT DETECTED Final   Enteropathogenic E coli (EPEC) NOT DETECTED NOT DETECTED Final   Enterotoxigenic E coli (ETEC) NOT DETECTED NOT DETECTED Final   Shiga like toxin producing E coli (STEC) NOT DETECTED NOT DETECTED Final   Shigella/Enteroinvasive E coli (EIEC) NOT DETECTED NOT DETECTED Final   Cryptosporidium NOT DETECTED NOT DETECTED Final   Cyclospora cayetanensis NOT DETECTED NOT DETECTED Final   Entamoeba histolytica NOT DETECTED NOT DETECTED Final   Giardia lamblia NOT DETECTED NOT DETECTED Final   Adenovirus F40/41 NOT DETECTED NOT DETECTED Final   Astrovirus NOT DETECTED NOT DETECTED Final   Norovirus GI/GII DETECTED (A) NOT DETECTED Final    Comment: RESULT CALLED TO, READ BACK BY  AND VERIFIED WITH: [place] NICKERSON 08/11/20 1150 KLW    Rotavirus A NOT DETECTED NOT DETECTED Final   Sapovirus (I, II, IV, and V) NOT DETECTED NOT DETECTED Final    Comment:  Performed at [facility], [address] [place], [place] [address]  C Difficile Quick Screen w PCR reflex     Status: Abnormal   Collection Time: 08/11/20  8:22 AM   Specimen: Stool  Result Value Ref Range Status   C Diff antigen POSITIVE (A) NEGATIVE Final   C Diff toxin NEGATIVE NEGATIVE Final   C Diff interpretation Results are indeterminate. See PCR results.  Final    Comment: Performed at [facility], [address] [place], [place] [address]  C. Diff by PCR, Reflexed     Status: None   Collection Time: 08/11/20  8:22 AM  Result Value Ref Range Status   Toxigenic C. Difficile by PCR NEGATIVE NEGATIVE Final    Comment: Patient is colonized with non toxigenic C. difficile. May not need treatment unless significant symptoms are present. Performed at [facility], [address] [place], [place] [address]     RADIOLOGY:  No results found.   CODE STATUS:     Code Status Orders  (From admission, onward)           Start     Ordered   08/11/20 1146  Full code  Continuous        08/11/20 1146           Code Status History     Date Active Date Inactive Code Status Order ID Comments User Context   05/04/2020 1750 05/09/2020 2213 Full Code [id number]  Darlin Drop, DO ED   10/08/2018 1104 10/08/2018 1550 Full Code [id number]  Annice Needy, MD Inpatient   10/01/2018 1254 10/01/2018 1741 Full Code [id number]  Annice Needy, MD Inpatient   03/08/2018 1315 03/12/2018 1844 Full Code [id number]  Annice Needy, MD Inpatient   03/08/2017 0933 03/08/2017 1427 Full Code [id number]  Annice Needy, MD Inpatient   10/29/2015 2116 11/01/2015 1424 Full Code [id number]  Enid Baas, MD Inpatient        TOTAL TIME TAKING CARE OF THIS PATIENT: 35 minutes.    Cheryl Leonard M.D  [facility]    CC: Primary care physician; Cheryl Penton, MD

## 2020-08-13 NOTE — Progress Notes (Signed)
Patient given instructions to make follow up appointments, when to return for worsening symptoms, IV taken out, medication education given, blood glucose rechecked at 283, & patient discharged via wheelchair.

## 2020-08-14 ENCOUNTER — Other Ambulatory Visit: Payer: Self-pay | Admitting: Urology

## 2020-08-14 LAB — BPAM RBC
Blood Product Expiration Date: 202208272359
Blood Product Expiration Date: 202209022359
Unit Type and Rh: 1700
Unit Type and Rh: 1700

## 2020-08-14 LAB — TYPE AND SCREEN
ABO/RH(D): B NEG
Antibody Screen: POSITIVE
Unit division: 0
Unit division: 0

## 2020-08-18 NOTE — Progress Notes (Signed)
[facility] Note  08/19/2020     CHIEF COMPLAINT Patient presents for Retina Follow Up   HISTORY OF PRESENT ILLNESS: Cheryl Leonard is a 75 y.o. female who presents to the clinic today for:   HPI     Retina Follow Up   Patient presents with  CRVO/BRVO.  In right eye.  Duration of 4 weeks.  Since onset it is stable.  I, the attending physician,  performed the HPI with the patient and updated documentation appropriately.        Comments   Pt here for 4 wk ret f/u BRVO w/ edema OD. Pt states vision is the same. She is noticing having to wear stronger readers. No ocular pain or discomfort.       Last edited by Bernarda Caffey, MD on 08/19/2020 10:23 PM.      Referring physician: Rusty Aus, [place],  [place] [address]  HISTORICAL INFORMATION:   Selected notes from the MEDICAL RECORD NUMBER Referred by Dr. Marvel Plan for concern of DME OD Lab Results  Component Value Date   HGBA1C 6.5 (H) 08/11/2020       CURRENT MEDICATIONS: No current outpatient medications on file. (Ophthalmic Drugs)   No current facility-administered medications for this visit. (Ophthalmic Drugs)   Current Outpatient Medications (Other)  Medication Sig   ALPRAZolam (XANAX) 0.25 MG tablet Take 0.25 mg by mouth daily as needed for anxiety or sleep.    amLODipine (NORVASC) 5 MG tablet Take 5 mg by mouth daily.   aspirin EC 81 MG tablet Take 81 mg by mouth daily.    cholecalciferol (VITAMIN D) 1000 units tablet Take 1,000 Units by mouth 2 (two) times daily.   CORAL CALCIUM PO Take 1 tablet by mouth 2 (two) times daily.    denosumab (PROLIA) 60 MG/ML SOLN injection Inject 60 mg into the skin every 6 (six) months.    escitalopram (LEXAPRO) 10 MG tablet Take 10 mg by mouth daily.   estradiol (ESTRACE) 0.1 MG/GM vaginal cream Apply one pea-sized amount around the opening of the urethra daily for two weeks, then  three times weekly thereafter.   famotidine (PEPCID) 40 MG tablet Take 40 mg by mouth daily.   furosemide (LASIX) 20 MG tablet Take 20 mg by mouth daily as needed for fluid or edema.    levothyroxine (SYNTHROID, LEVOTHROID) 100 MCG tablet Take 100 mcg by mouth daily before breakfast.    lidocaine-prilocaine (EMLA) cream Apply 1 application topically as needed (apply prior to port a cath access).   Magnesium 500 MG TABS Take 500 mg by mouth every morning.    metFORMIN (GLUCOPHAGE) 1000 MG tablet Take 1 tablet by mouth 2 (two) times daily with a meal.   metoprolol succinate (TOPROL-XL) 50 MG 24 hr tablet Take 50 mg by mouth daily.   mirtazapine (REMERON) 15 MG tablet Take 15 mg by mouth at bedtime.   Multiple Vitamin (MULTIVITAMIN WITH MINERALS) TABS tablet Take 1 tablet by mouth daily.   nystatin cream (MYCOSTATIN) Apply 1 application topically 2 (two) times daily.   olmesartan (BENICAR) 20 MG tablet Take 20 mg by mouth daily.   pantoprazole (PROTONIX) 40 MG tablet Take 40 mg by mouth every morning.    rivaroxaban (XARELTO) 20 MG TABS tablet Take 1 tablet (20 mg total) by mouth daily with supper.   rosuvastatin (CRESTOR) 20 MG tablet Take 20 mg by mouth every morning.  TRESIBA FLEXTOUCH 200 UNIT/ML SOPN Inject 30 Units as directed at bedtime.    vitamin B-12 (CYANOCOBALAMIN) 100 MCG tablet Take 100 mcg by mouth daily.   vitamin E 400 UNIT capsule Take 400 Units by mouth daily.   zolpidem (AMBIEN) 10 MG tablet Take 10 mg by mouth at bedtime as needed for sleep.   No current facility-administered medications for this visit. (Other)    REVIEW OF SYSTEMS: ROS   Positive for: Neurological, Genitourinary, Musculoskeletal, Endocrine, Eyes Negative for: Constitutional, Gastrointestinal, Skin, HENT, Cardiovascular, Respiratory, Psychiatric, Allergic/Imm, Heme/Lymph Last edited by Kingsley Spittle, COT on 08/19/2020  2:24 PM.      ALLERGIES Allergies  Allergen Reactions   Ace Inhibitors  Other (See Comments)    PAST MEDICAL HISTORY Past Medical History:  Diagnosis Date   Anemia    Anxiety    Arthritis    Gout   Cataracts, both eyes    Diabetic retinopathy ([place])    NPDR OU   Diabetic retinopathy ([place])    GERD (gastroesophageal reflux disease)    Gout    Headache    h/o migraines   History of fracture of patella    right knee   History of positive PPD    Patient always shows positive   Hyperlipidemia    Hypertension    Hypertensive retinopathy    OU   Hypothyroidism    Lichen sclerosus 94/17/4081   of vulva   Metatarsal fracture    Neuropathy    Peripheral vascular disease (HCC)    Polyneuropathy    numbness and tingling in feet and toes   Renal insufficiency    Stage 3   Sleep apnea    does not use cpap-lost weight    Type 2 diabetes mellitus, uncontrolled ([place])    Past Surgical History:  Procedure Laterality Date   ABDOMINAL HYSTERECTOMY     AMPUTATION TOE Right 05/08/2020   Procedure: AMPUTATION TOE-Right 4th Toe;  Surgeon: Caroline More, DPM;  Location: [facility];  Service: Podiatry;  Laterality: Right;   APPENDECTOMY     BREAST REDUCTION SURGERY  2001   CATARACT EXTRACTION     CESAREAN SECTION  1976   COLONOSCOPY  03/05/2013   Nml - due for repeat 03/06/2018   COLONOSCOPY WITH PROPOFOL N/A 03/18/2019   Procedure: COLONOSCOPY WITH PROPOFOL;  Surgeon: Toledo, Benay Pike, MD;  Location: [facility];  Service: Gastroenterology;  Laterality: N/A;   DIAGNOSTIC LAPAROSCOPY     DILATION AND CURETTAGE OF UTERUS  1989   ENDARTERECTOMY FEMORAL Bilateral 03/09/2018   Procedure: ENDARTERECTOMY FEMORAL;  Surgeon: Algernon Huxley, MD;  Location: [facility];  Service: Vascular;  Laterality: Bilateral;   ENDARTERECTOMY POPLITEAL Left 03/09/2018   Procedure: ENDARTERECTOMY POPLITEAL AND SFA;  Surgeon: Algernon Huxley, MD;  Location: [facility];  Service: Vascular;  Laterality: Left;   ESOPHAGOGASTRODUODENOSCOPY  03/05/2013   ESOPHAGOGASTRODUODENOSCOPY (EGD) WITH PROPOFOL N/A  03/18/2019   Procedure: ESOPHAGOGASTRODUODENOSCOPY (EGD) WITH PROPOFOL;  Surgeon: Toledo, Benay Pike, MD;  Location: [facility];  Service: Gastroenterology;  Laterality: N/A;   EYE SURGERY     Eyelid Surgery  2012   INTRAMEDULLARY (IM) NAIL INTERTROCHANTERIC Left 10/30/2015   Procedure: INTRAMEDULLARY (IM) NAIL INTERTROCHANTRIC ;  Surgeon: Hessie Knows, MD;  Location: [facility];  Service: Orthopedics;  Laterality: Left;   KYPHOPLASTY N/A 10/25/2018   Procedure: L4 KYPHOPLASTY;  Surgeon: Hessie Knows, MD;  Location: [facility];  Service: Orthopedics;  Laterality: N/A;   [place]  total   LOWER EXTREMITY ANGIOGRAPHY Left 03/08/2017   Procedure: LOWER EXTREMITY ANGIOGRAPHY;  Surgeon: Algernon Huxley, MD;  Location: [place] [facility];  Service: Cardiovascular;  Laterality: Left;   LOWER EXTREMITY ANGIOGRAPHY Left 10/30/2017   Procedure: LOWER EXTREMITY ANGIOGRAPHY;  Surgeon: Algernon Huxley, MD;  Location: [place] [facility];  Service: Cardiovascular;  Laterality: Left;   LOWER EXTREMITY ANGIOGRAPHY Right 03/08/2018   Procedure: LOWER EXTREMITY ANGIOGRAPHY;  Surgeon: Algernon Huxley, MD;  Location: [place] [facility];  Service: Cardiovascular;  Laterality: Right;   LOWER EXTREMITY ANGIOGRAPHY Left 10/01/2018   Procedure: LOWER EXTREMITY ANGIOGRAPHY;  Surgeon: Algernon Huxley, MD;  Location: [place] [facility];  Service: Cardiovascular;  Laterality: Left;   LOWER EXTREMITY ANGIOGRAPHY Right 10/08/2018   Procedure: LOWER EXTREMITY ANGIOGRAPHY;  Surgeon: Algernon Huxley, MD;  Location: [place] [facility];  Service: Cardiovascular;  Laterality: Right;   LOWER EXTREMITY ANGIOGRAPHY Right 05/07/2020   Procedure: Lower Extremity Angiography;  Surgeon: Algernon Huxley, MD;  Location: [place] [facility];  Service: Cardiovascular;  Laterality: Right;   PERIPHERAL VASCULAR INTERVENTION  03/08/2018   Procedure: PERIPHERAL VASCULAR INTERVENTION;  Surgeon: Algernon Huxley, MD;  Location: [place] [facility];  Service: Cardiovascular;;   PORTA CATH INSERTION N/A 02/17/2020   Procedure: PORTA CATH INSERTION;  Surgeon: Algernon Huxley, MD;  Location: [place] [facility];  Service: Cardiovascular;  Laterality: N/A;   REDUCTION MAMMAPLASTY  1997   SACROPLASTY N/A 10/25/2018   Procedure: S1 SACROPLASTY;  Surgeon: Hessie Knows, MD;  Location: [facility];  Service: Orthopedics;  Laterality: N/A;    FAMILY HISTORY Family History  Problem Relation Age of Onset   Coronary artery disease Father    Heart attack Father    Coronary artery disease Mother    Heart attack Mother    Ovarian cancer Sister 41       sister had hormonal therapy for IVF txs-which increased risk factor for ovarian cancer   Breast cancer Neg Hx     SOCIAL HISTORY Social History   Tobacco Use   Smoking status: Former    Packs/day: 1.00    Years: 20.00    Pack years: 20.00    Types: Cigarettes    Quit date: 03/07/1996    Years since quitting: 24.4   Smokeless tobacco: Never   Tobacco comments:    started smoking at age 24  Vaping Use   Vaping Use: Never used  Substance Use Topics   Alcohol use: No    Alcohol/week: 0.0 standard drinks   Drug use: No         OPHTHALMIC EXAM:  Base Eye Exam     Visual Acuity (Snellen - Linear)       Right Left   Dist [place] 20/25 -2 20/25         Tonometry (Tonopen, 2:31 PM)       Right Left   Pressure 15 18         Pupils       Dark Light Shape React APD   Right 3 2 Round Minimal None   Left 3 2 Round Minimal None         Visual Fields (Counting fingers)       Left Right    Full Full         Extraocular Movement       Right Left    Full, Ortho Full, Ortho  Neuro/Psych     Oriented x3: Yes   Mood/Affect: Normal         Dilation     Both eyes: 1.0% Mydriacyl, 2.5% Phenylephrine @ 2:32 PM           Slit Lamp and Fundus Exam     External Exam       Right Left   External Normal Normal         Slit Lamp Exam       Right  Left   Lids/Lashes dermatochalasis dermatochalasis   Conjunctiva/Sclera White and quiet White and quiet   Cornea arcus; well healed cataract wound; 2-3+ diffuse Punctate epithelial erosions, decreased TBUT, mild Anterior basement membrane dystrophy superiorly arcus; well healed cataract wound, 2-3+ diffuse Punctate epithelial erosions, irregualr epi surface, decreased TBUT   Anterior Chamber Deep and quiet Deep and quiet   Iris Round and dilated Round and dilated   Lens PCIOL; open PC PCIOL; open PC   Vitreous syneresis, Posterior vitreous detachment, vitreous condensations inferiorly syneresis, Posterior vitreous detachment         Fundus Exam       Right Left   Disc Superior hyperemia and mild edema - improved, mild Pallor Pink and Sharp   C/D Ratio 0.5 0.5   Macula Flat, Blunted foveal reflex, trace cystic changes -- persistent, +Epiretinal membrane, no heme flat; good foveal reflex, no heme or edema, small pigment clump IT to fovea   Vessels attenuated, Tortuous Vascular attenuation   Periphery Attached; focal dot heme temporal periphery Attached, no heme            IMAGING AND PROCEDURES  Imaging and Procedures for 04/25/17  OCT, Retina - OU - Both Eyes       Right Eye Quality was good. Central Foveal Thickness: 337. Progression has been stable. Findings include abnormal foveal contour, epiretinal membrane, no SRF, intraretinal fluid (Trace persistent cystic changes IT fovea).   Left Eye Quality was good. Central Foveal Thickness: 286. Progression has been stable. Findings include normal foveal contour, no IRF, no SRF (Trace ERM).   Notes *Images captured and stored on drive  Diagnosis / Impression:  OD: BRVO -- Trace persistent cystic changes IT fovea OS: NFP; no IRF/SRF--stable, trace ERM  Clinical management:  See below  Abbreviations: NFP - Normal foveal profile. CME - cystoid macular edema. PED - pigment epithelial detachment. IRF - intraretinal fluid. SRF -  subretinal fluid. EZ - ellipsoid zone. ERM - epiretinal membrane. ORA - outer retinal atrophy. ORT - outer retinal tubulation. SRHM - subretinal hyper-reflective material      Intravitreal Injection, Pharmacologic Agent - OD - Right Eye       Time Out 08/19/2020. 3:34 PM. Confirmed correct patient, procedure, site, and patient consented.   Anesthesia Topical anesthesia was used. Anesthetic medications included Lidocaine 2%, Proparacaine 0.5%.   Procedure Preparation included 5% betadine to ocular surface, eyelid speculum. A (32g) needle was used.   Injection: 2 mg aflibercept 2 MG/0.05ML   Route: Intravitreal, Site: Right Eye   NDC: [id number], Lot: [id number], Expiration date: 06/01/2021, Waste: 0.05 mL   Post-op Post injection exam found visual acuity of at least counting fingers. The patient tolerated the procedure well. There were no complications. The patient received written and verbal post procedure care education. Post injection medications were not given.            ASSESSMENT/PLAN:    ICD-10-CM   1. Branch retinal vein occlusion of right eye with  macular edema  H34.8310 Intravitreal Injection, Pharmacologic Agent - OD - Right Eye    aflibercept (EYLEA) SOLN 2 mg    2. Retinal edema  H35.81 OCT, Retina - OU - Both Eyes    3. Both eyes affected by mild nonproliferative diabetic retinopathy with macular edema, associated with type 2 diabetes mellitus ([place])  I09.7353     4. Essential hypertension  I10     5. Hypertensive retinopathy of both eyes  H35.033     6. Epiretinal membrane (ERM) of right eye  H35.371     7. Pseudophakia of both eyes  Z96.1      1,2. BRVO w/ CME OD  - by history, pt states symptoms first noticed 2 wks prior to presentation, but reports changes may have occurred prior  - initial exam with differential tortuosity of vessels (OD > OS)  - FA (02.10.20) shows mild late staining / leakage in macula, staining / leakage of disc -- improving  CME  - differential includes DM2 (DME), hypertensive retinopathy, inflammatory etiology / uveitis  - S/P IVA OD #1 (02.08.19), #2 (03.11.19), #3 (04.09.19), #4 (05.20.19), #5 (02.10.20)  - gave IVA OD on 2.10.20 due to pending Eylea4U for 2020 -- resulted in increased IRF/CME  - review of OCTs show persistent IRF and cystic changes --  resistance to IVA   - June 2019 -- switched therapies: S/P IVE OD #1 (06.24.19), #2 (07.24.19), #3 (09.04.19), #4 (10.30.19),#5 (12.30.19), #6 (03.23.20), #7 (05.05.20), #8 (07.16.20), #9 (07.17.20), #10 (08.28.20), #11 (10.13.20), # 12 (11.17.20), #13 (2.8.21), #14 (03.09.21), #15 (04.13.21), #16 (05.11.21), #17 (06.17.21), #18 (07.23.21), #19 (08.30.21), #20 (10.04.21), #21 (11.08.21), #22 (12.08.21), #23 (01.31.22), #24 (02.28.22), #25 (04.01.22), #26 (06.15.22), #27 (07.13.22)  - OCT today shows trace persistent cystic changes IT fovea at 5 weeks  - BCVA 20/25 -- stable  - Eylea4U benefits investigation completed and pt approved for IVE for 2022  - recommend IVE OD #28 today, 08.17.22 w/ f/u in 4-5 wks  - RBA of procedure discussed, questions answered  - informed consent obtained  - see procedure note  - Eylea informed consent form obtained and scanned on 11.19.2020  - f/u 4 weeks  -- DFE/OCT/possible injection  3. Mild nonproliferative diabetic retinopathy, both eyes  - A1c 6.3 on 5.2.22  - could be contributing to CME OD  - OS with minimal diabetic retinopathy  - continue to monitor  4,5. Hypertensive retinopathy OU - stable  - as above, may have contributing to CME OD  - discussed importance of tight BP control  - monitor  6. Epiretinal membrane, right eye   - stable nasal ERM  - no indication for surgery at this time  7. Pseudophakia OU  - s/p CE/IOL OU by cataract surgeon in [facility]  - doing well  - monitor    Ophthalmic Meds Ordered this visit:  Meds ordered this encounter  Medications   aflibercept (EYLEA) SOLN 2 mg        Return  for f/u 4-5 weeks, BRVO OD, DFE, OCT.  This document serves as a record of services personally performed by Gardiner Sleeper, MD, PhD. It was created on their behalf by Roselee Nova, COMT. The creation of this record is the provider's dictation and/or activities during the visit.  Electronically signed by: Roselee Nova, COMT 08/19/20 10:27 PM  This document serves as a record of services personally performed by Gardiner Sleeper, MD, PhD. It was created on their behalf by San Jetty. Owens Shark,  OA an ophthalmic technician. The creation of this record is the provider's dictation and/or activities during the visit.    Electronically signed by: San Jetty. Marguerita Merles 08.17.2022 10:27 PM   Gardiner Sleeper, M.D., Ph.D. Diseases & Surgery of the Retina and Vitreous [facility]  I have reviewed the above documentation for accuracy and completeness, and I agree with the above. Gardiner Sleeper, M.D., Ph.D. 08/19/20 10:27 PM   Abbreviations: M myopia (nearsighted); A astigmatism; H hyperopia (farsighted); P presbyopia; Mrx spectacle prescription;  CTL contact lenses; OD right eye; OS left eye; OU both eyes  XT exotropia; ET esotropia; PEK punctate epithelial keratitis; PEE punctate epithelial erosions; DES dry eye syndrome; MGD meibomian gland dysfunction; ATs artificial tears; PFAT's preservative free artificial tears; [place] nuclear sclerotic cataract; PSC posterior subcapsular cataract; ERM epi-retinal membrane; PVD posterior vitreous detachment; RD retinal detachment; DM diabetes mellitus; DR diabetic retinopathy; NPDR non-proliferative diabetic retinopathy; PDR proliferative diabetic retinopathy; CSME clinically significant macular edema; DME diabetic macular edema; dbh dot blot hemorrhages; CWS cotton wool spot; POAG primary open angle glaucoma; C/D cup-to-disc ratio; HVF humphrey visual field; GVF goldmann visual field; OCT optical coherence tomography; IOP intraocular pressure; BRVO Branch  retinal vein occlusion; CRVO central retinal vein occlusion; CRAO central retinal artery occlusion; BRAO branch retinal artery occlusion; RT retinal tear; SB scleral buckle; PPV pars plana vitrectomy; VH Vitreous hemorrhage; PRP panretinal laser photocoagulation; IVK intravitreal kenalog; VMT vitreomacular traction; MH Macular hole;  NVD neovascularization of the disc; NVE neovascularization elsewhere; AREDS age related eye disease study; ARMD age related macular degeneration; POAG primary open angle glaucoma; EBMD epithelial/anterior basement membrane dystrophy; ACIOL anterior chamber intraocular lens; IOL intraocular lens; PCIOL posterior chamber intraocular lens; Phaco/IOL phacoemulsification with intraocular lens placement; [place] photorefractive keratectomy; LASIK laser assisted in situ keratomileusis; HTN hypertension; DM diabetes mellitus; COPD chronic obstructive pulmonary disease

## 2020-08-19 ENCOUNTER — Other Ambulatory Visit: Payer: Self-pay

## 2020-08-19 ENCOUNTER — Ambulatory Visit (INDEPENDENT_AMBULATORY_CARE_PROVIDER_SITE_OTHER): Payer: Medicare Other | Admitting: Ophthalmology

## 2020-08-19 ENCOUNTER — Encounter (INDEPENDENT_AMBULATORY_CARE_PROVIDER_SITE_OTHER): Payer: Self-pay | Admitting: Ophthalmology

## 2020-08-19 DIAGNOSIS — I10 Essential (primary) hypertension: Secondary | ICD-10-CM | POA: Diagnosis not present

## 2020-08-19 DIAGNOSIS — E113213 Type 2 diabetes mellitus with mild nonproliferative diabetic retinopathy with macular edema, bilateral: Secondary | ICD-10-CM | POA: Diagnosis not present

## 2020-08-19 DIAGNOSIS — H35371 Puckering of macula, right eye: Secondary | ICD-10-CM

## 2020-08-19 DIAGNOSIS — H3581 Retinal edema: Secondary | ICD-10-CM

## 2020-08-19 DIAGNOSIS — H34831 Tributary (branch) retinal vein occlusion, right eye, with macular edema: Secondary | ICD-10-CM

## 2020-08-19 DIAGNOSIS — Z961 Presence of intraocular lens: Secondary | ICD-10-CM

## 2020-08-19 DIAGNOSIS — H35033 Hypertensive retinopathy, bilateral: Secondary | ICD-10-CM

## 2020-08-19 MED ORDER — AFLIBERCEPT 2MG/0.05ML IZ SOLN FOR KALEIDOSCOPE
2.0000 mg | INTRAVITREAL | Status: AC | PRN
  Administered 2020-08-19: 2 mg via INTRAVITREAL

## 2020-09-10 ENCOUNTER — Inpatient Hospital Stay: Payer: Medicare Other

## 2020-09-22 NOTE — Progress Notes (Signed)
[facility] - Clinic Note  09/23/2020     CHIEF COMPLAINT Patient presents for Retina Follow Up   HISTORY OF PRESENT ILLNESS: Cheryl Leonard is a 75 y.o. female who presents to the clinic today for:   HPI     Retina Follow Up   Patient presents with  CRVO/BRVO.  In right eye.  This started weeks ago.  Severity is moderate.  Duration of weeks.  Since onset it is stable.  I, the attending physician,  performed the HPI with the patient and updated documentation appropriately.        Comments   Pt states vision is the same OU.  Pt denies eye pain or discomfort.  Pt denies new or worsening floaters or fol OU.      Last edited by [name], MD on 09/27/2020 12:32 AM.      Referring physician: Danella Penton, MD [phone] [facility] [address] [facility] [place],  [place] [address]  HISTORICAL INFORMATION:   Selected notes from the MEDICAL RECORD NUMBER Referred by Dr. Senaida Ores for concern of DME OD Lab Results  Component Value Date   HGBA1C 6.5 (H) 08/11/2020       CURRENT MEDICATIONS: No current outpatient medications on file. (Ophthalmic Drugs)   No current facility-administered medications for this visit. (Ophthalmic Drugs)   Current Outpatient Medications (Other)  Medication Sig   ALPRAZolam (XANAX) 0.25 MG tablet Take 0.25 mg by mouth daily as needed for anxiety or sleep.    amLODipine (NORVASC) 5 MG tablet Take 5 mg by mouth daily.   aspirin EC 81 MG tablet Take 81 mg by mouth daily.    cholecalciferol (VITAMIN D) 1000 units tablet Take 1,000 Units by mouth 2 (two) times daily.   CORAL CALCIUM PO Take 1 tablet by mouth 2 (two) times daily.    denosumab (PROLIA) 60 MG/ML SOLN injection Inject 60 mg into the skin every 6 (six) months.    escitalopram (LEXAPRO) 10 MG tablet Take 10 mg by mouth daily.   estradiol (ESTRACE) 0.1 MG/GM vaginal cream Apply one pea-sized amount around the opening of the urethra daily for two  weeks, then three times weekly thereafter.   famotidine (PEPCID) 40 MG tablet Take 40 mg by mouth daily.   furosemide (LASIX) 20 MG tablet Take 20 mg by mouth daily as needed for fluid or edema.    levothyroxine (SYNTHROID, LEVOTHROID) 100 MCG tablet Take 100 mcg by mouth daily before breakfast.    lidocaine-prilocaine (EMLA) cream Apply 1 application topically as needed (apply prior to port a cath access).   Magnesium 500 MG TABS Take 500 mg by mouth every morning.    metFORMIN (GLUCOPHAGE) 1000 MG tablet Take 1 tablet by mouth 2 (two) times daily with a meal.   metoprolol succinate (TOPROL-XL) 50 MG 24 hr tablet Take 50 mg by mouth daily.   mirtazapine (REMERON) 15 MG tablet Take 15 mg by mouth at bedtime.   Multiple Vitamin (MULTIVITAMIN WITH MINERALS) TABS tablet Take 1 tablet by mouth daily.   nystatin cream (MYCOSTATIN) Apply 1 application topically 2 (two) times daily.   olmesartan (BENICAR) 20 MG tablet Take 20 mg by mouth daily.   pantoprazole (PROTONIX) 40 MG tablet Take 40 mg by mouth every morning.    rivaroxaban (XARELTO) 20 MG TABS tablet Take 1 tablet (20 mg total) by mouth daily with supper.   rosuvastatin (CRESTOR) 20 MG tablet Take 20 mg by mouth every morning.  sulfamethoxazole-trimethoprim (BACTRIM) 400-80 MG tablet Take 1 tablet by mouth daily.   TRESIBA FLEXTOUCH 200 UNIT/ML SOPN Inject 30 Units as directed at bedtime.    vitamin B-12 (CYANOCOBALAMIN) 100 MCG tablet Take 100 mcg by mouth daily.   vitamin E 400 UNIT capsule Take 400 Units by mouth daily.   zolpidem (AMBIEN) 10 MG tablet Take 10 mg by mouth at bedtime as needed for sleep.   No current facility-administered medications for this visit. (Other)    REVIEW OF SYSTEMS: ROS   Positive for: Neurological, Genitourinary, Musculoskeletal, Endocrine, Eyes Negative for: Constitutional, Gastrointestinal, Skin, HENT, Cardiovascular, Respiratory, Psychiatric, Allergic/Imm, Heme/Lymph Last edited by Corrinne Eagle  on 09/23/2020  2:25 PM.       ALLERGIES Allergies  Allergen Reactions   Ace Inhibitors Other (See Comments)    PAST MEDICAL HISTORY Past Medical History:  Diagnosis Date   Anemia    Anxiety    Arthritis    Gout   Cataracts, both eyes    Diabetic retinopathy (HCC)    NPDR OU   Diabetic retinopathy (HCC)    GERD (gastroesophageal reflux disease)    Gout    Headache    h/o migraines   History of fracture of patella    right knee   History of positive PPD    Patient always shows positive   Hyperlipidemia    Hypertension    Hypertensive retinopathy    OU   Hypothyroidism    Lichen sclerosus 12/30/2013   of vulva   Metatarsal fracture    Neuropathy    Peripheral vascular disease (HCC)    Polyneuropathy    numbness and tingling in feet and toes   Renal insufficiency    Stage 3   Sleep apnea    does not use cpap-lost weight    Type 2 diabetes mellitus, uncontrolled (HCC)    Past Surgical History:  Procedure Laterality Date   ABDOMINAL HYSTERECTOMY     AMPUTATION TOE Right 05/08/2020   Procedure: AMPUTATION TOE-Right 4th Toe;  Surgeon: Rosetta Posner, DPM;  Location: [facility];  Service: Podiatry;  Laterality: Right;   APPENDECTOMY     BREAST REDUCTION SURGERY  2001   CATARACT EXTRACTION     CESAREAN SECTION  1976   COLONOSCOPY  03/05/2013   Nml - due for repeat 03/06/2018   COLONOSCOPY WITH PROPOFOL N/A 03/18/2019   Procedure: COLONOSCOPY WITH PROPOFOL;  Surgeon: Toledo, [name], MD;  Location: [facility];  Service: Gastroenterology;  Laterality: N/A;   DIAGNOSTIC LAPAROSCOPY     DILATION AND CURETTAGE OF UTERUS  1989   ENDARTERECTOMY FEMORAL Bilateral 03/09/2018   Procedure: ENDARTERECTOMY FEMORAL;  Surgeon: Annice Needy, MD;  Location: [facility];  Service: Vascular;  Laterality: Bilateral;   ENDARTERECTOMY POPLITEAL Left 03/09/2018   Procedure: ENDARTERECTOMY POPLITEAL AND SFA;  Surgeon: Annice Needy, MD;  Location: [facility];  Service: Vascular;  Laterality: Left;    ESOPHAGOGASTRODUODENOSCOPY  03/05/2013   ESOPHAGOGASTRODUODENOSCOPY (EGD) WITH PROPOFOL N/A 03/18/2019   Procedure: ESOPHAGOGASTRODUODENOSCOPY (EGD) WITH PROPOFOL;  Surgeon: Toledo, [name], MD;  Location: [facility];  Service: Gastroenterology;  Laterality: N/A;   EYE SURGERY     Eyelid Surgery  2012   INTRAMEDULLARY (IM) NAIL INTERTROCHANTERIC Left 10/30/2015   Procedure: INTRAMEDULLARY (IM) NAIL INTERTROCHANTRIC ;  Surgeon: Kennedy Bucker, MD;  Location: [facility];  Service: Orthopedics;  Laterality: Left;   KYPHOPLASTY N/A 10/25/2018   Procedure: L4 KYPHOPLASTY;  Surgeon: Kennedy Bucker, MD;  Location: [facility];  Service: Orthopedics;  Laterality: N/A;   LAPAROSCOPIC HYSTERECTOMY  2000   total   LOWER EXTREMITY ANGIOGRAPHY Left 03/08/2017   Procedure: LOWER EXTREMITY ANGIOGRAPHY;  Surgeon: Annice Needy, MD;  Location: [facility];  Service: Cardiovascular;  Laterality: Left;   LOWER EXTREMITY ANGIOGRAPHY Left 10/30/2017   Procedure: LOWER EXTREMITY ANGIOGRAPHY;  Surgeon: Annice Needy, MD;  Location: [facility];  Service: Cardiovascular;  Laterality: Left;   LOWER EXTREMITY ANGIOGRAPHY Right 03/08/2018   Procedure: LOWER EXTREMITY ANGIOGRAPHY;  Surgeon: Annice Needy, MD;  Location: [facility];  Service: Cardiovascular;  Laterality: Right;   LOWER EXTREMITY ANGIOGRAPHY Left 10/01/2018   Procedure: LOWER EXTREMITY ANGIOGRAPHY;  Surgeon: Annice Needy, MD;  Location: [facility];  Service: Cardiovascular;  Laterality: Left;   LOWER EXTREMITY ANGIOGRAPHY Right 10/08/2018   Procedure: LOWER EXTREMITY ANGIOGRAPHY;  Surgeon: Annice Needy, MD;  Location: [facility];  Service: Cardiovascular;  Laterality: Right;   LOWER EXTREMITY ANGIOGRAPHY Right 05/07/2020   Procedure: Lower Extremity Angiography;  Surgeon: Annice Needy, MD;  Location: [facility];  Service: Cardiovascular;  Laterality: Right;   PERIPHERAL VASCULAR INTERVENTION  03/08/2018    Procedure: PERIPHERAL VASCULAR INTERVENTION;  Surgeon: Annice Needy, MD;  Location: [facility];  Service: Cardiovascular;;   PORTA CATH INSERTION N/A 02/17/2020   Procedure: PORTA CATH INSERTION;  Surgeon: Annice Needy, MD;  Location: [facility];  Service: Cardiovascular;  Laterality: N/A;   REDUCTION MAMMAPLASTY  1997   SACROPLASTY N/A 10/25/2018   Procedure: S1 SACROPLASTY;  Surgeon: Kennedy Bucker, MD;  Location: [facility];  Service: Orthopedics;  Laterality: N/A;    FAMILY HISTORY Family History  Problem Relation Age of Onset   Coronary artery disease Father    Heart attack Father    Coronary artery disease Mother    Heart attack Mother    Ovarian cancer Sister 75       sister had hormonal therapy for IVF txs-which increased risk factor for ovarian cancer   Breast cancer Neg Hx     SOCIAL HISTORY Social History   Tobacco Use   Smoking status: Former    Packs/day: 1.00    Years: 20.00    Pack years: 20.00    Types: Cigarettes    Quit date: 03/07/1996    Years since quitting: 24.5   Smokeless tobacco: Never   Tobacco comments:    started smoking at age 68  Vaping Use   Vaping Use: Never used  Substance Use Topics   Alcohol use: No    Alcohol/week: 0.0 standard drinks   Drug use: No         OPHTHALMIC EXAM:  Base Eye Exam     Visual Acuity (Snellen - Linear)       Right Left   Dist cc 20/30 -2 20/20   Dist ph cc NI     Correction: Glasses         Tonometry (Tonopen, 2:28 PM)       Right Left   Pressure 12 13         Pupils       Leonard Light Shape React APD   Right 3 2 Round Minimal 0   Left 3 2 Round Minimal 0         Visual Fields       Left Right    Full Full  Neuro/Psych     Oriented x3: Yes   Mood/Affect: Normal         Dilation     Both eyes: 1.0% Mydriacyl, 2.5% Phenylephrine @ 2:28 PM           Slit Lamp and Fundus Exam     External Exam       Right Left   External Normal Normal          Slit Lamp Exam       Right Left   Lids/Lashes dermatochalasis dermatochalasis   Conjunctiva/Sclera White and quiet White and quiet   Cornea arcus; well healed cataract wound; 2-3+ diffuse Punctate epithelial erosions, decreased TBUT, mild Anterior basement membrane dystrophy superiorly arcus; well healed cataract wound, 2-3+ diffuse Punctate epithelial erosions, irregualr epi surface, decreased TBUT   Anterior Chamber Deep and quiet Deep and quiet   Iris Round and dilated Round and dilated   Lens PCIOL; open PC PCIOL; open PC   Vitreous syneresis, Posterior vitreous detachment, vitreous condensations inferiorly syneresis, Posterior vitreous detachment         Fundus Exam       Right Left   Disc Superior hyperemia and mild edema - improved, mild Pallor Pink and Sharp   C/D Ratio 0.6 0.5   Macula Flat, Blunted foveal reflex, trace cystic changes -- persistent, +Epiretinal membrane, no heme flat; good foveal reflex, no heme or edema, small pigment clump IT to fovea   Vessels attenuated, Tortuous Vascular attenuation   Periphery Attached; focal dot heme temporal periphery Attached, no heme            IMAGING AND PROCEDURES  Imaging and Procedures for 04/25/17  OCT, Retina - OU - Both Eyes       Right Eye Quality was good. Central Foveal Thickness: 334. Progression has been stable. Findings include abnormal foveal contour, epiretinal membrane, no SRF, intraretinal fluid (Trace persistent cystic changes IT fovea).   Left Eye Quality was good. Central Foveal Thickness: 281. Progression has been stable. Findings include normal foveal contour, no IRF, no SRF (Trace ERM).   Notes *Images captured and stored on drive  Diagnosis / Impression:  OD: BRVO -- Trace persistent cystic changes IT fovea OS: NFP; no IRF/SRF--stable, trace ERM  Clinical management:  See below  Abbreviations: NFP - Normal foveal profile. CME - cystoid macular edema. PED - pigment epithelial  detachment. IRF - intraretinal fluid. SRF - subretinal fluid. EZ - ellipsoid zone. ERM - epiretinal membrane. ORA - outer retinal atrophy. ORT - outer retinal tubulation. SRHM - subretinal hyper-reflective material      Intravitreal Injection, Pharmacologic Agent - OD - Right Eye       Time Out 09/23/2020. 3:24 PM. Confirmed correct patient, procedure, site, and patient consented.   Anesthesia Topical anesthesia was used. Anesthetic medications included Lidocaine 2%, Proparacaine 0.5%.   Procedure Preparation included 5% betadine to ocular surface, eyelid speculum. A (32g) needle was used.   Injection: 2 mg aflibercept 2 MG/0.05ML   Route: Intravitreal, Site: Right Eye   NDC: [id number], Lot: [id number], Expiration date: 07/02/2020, Waste: 0.05 mL   Post-op Post injection exam found visual acuity of at least counting fingers. The patient tolerated the procedure well. There were no complications. The patient received written and verbal post procedure care education. Post injection medications were not given.            ASSESSMENT/PLAN:    ICD-10-CM   1. Branch retinal vein occlusion of right eye with  macular edema  H34.8310 Intravitreal Injection, Pharmacologic Agent - OD - Right Eye    aflibercept (EYLEA) SOLN 2 mg    2. Retinal edema  H35.81 OCT, Retina - OU - Both Eyes    3. Both eyes affected by mild nonproliferative diabetic retinopathy with macular edema, associated with type 2 diabetes mellitus (HCC)  C14.4818     4. Essential hypertension  I10     5. Hypertensive retinopathy of both eyes  H35.033     6. Epiretinal membrane (ERM) of right eye  H35.371     7. Pseudophakia of both eyes  Z96.1     1,2. BRVO w/ CME OD  - by history, pt states symptoms first noticed 2 wks prior to presentation, but reports changes may have occurred prior  - initial exam with differential tortuosity of vessels (OD > OS)  - FA (02.10.20) shows mild late staining / leakage in macula,  staining / leakage of disc -- improving CME  - differential includes DM2 (DME), hypertensive retinopathy, inflammatory etiology / uveitis  - S/P IVA OD #1 (02.08.19), #2 (03.11.19), #3 (04.09.19), #4 (05.20.19), #5 (02.10.20)  - gave IVA OD on 2.10.20 due to pending Eylea4U for 2020 -- resulted in increased IRF/CME  - review of OCTs show persistent IRF and cystic changes --  resistance to IVA   - June 2019 -- switched therapies: S/P IVE OD #1 (06.24.19), #2 (07.24.19), #3 (09.04.19), #4 (10.30.19),#5 (12.30.19), #6 (03.23.20), #7 (05.05.20), #8 (07.16.20), #9 (07.17.20), #10 (08.28.20), #11 (10.13.20), # 12 (11.17.20), #13 (2.8.21), #14 (03.09.21), #15 (04.13.21), #16 (05.11.21), #17 (06.17.21), #18 (07.23.21), #19 (08.30.21), #20 (10.04.21), #21 (11.08.21), #22 (12.08.21), #23 (01.31.22), #24 (02.28.22), #25 (04.01.22), #26 (06.15.22), #27 (07.13.22), #28 (08.17.22)  - OCT today shows trace persistent cystic changes IT fovea at 5 weeks  - BCVA 20/30, down from 20/25  - Eylea4U benefits investigation completed and pt approved for IVE for 2022  - recommend IVE OD #29 today, 09.21.22 w/ f/u in 4-5 wks  - RBA of procedure discussed, questions answered  - informed consent obtained  - see procedure note  - Eylea informed consent form obtained and scanned on 11.19.2020  - f/u 4-5 weeks  -- DFE/OCT/possible injection  3. Mild nonproliferative diabetic retinopathy, both eyes  - A1c 6.3 on 5.2.22  - could be contributing to CME OD  - OS with minimal diabetic retinopathy  - continue to monitor  4,5. Hypertensive retinopathy OU - stable  - as above, may have contributing to CME OD  - discussed importance of tight BP control  - monitor  6. Epiretinal membrane, right eye   - stable nasal ERM  - no indication for surgery at this time  7. Pseudophakia OU  - s/p CE/IOL OU by cataract surgeon in [facility]  - doing well  - monitor  Ophthalmic Meds Ordered this visit:  Meds ordered this encounter   Medications   aflibercept (EYLEA) SOLN 2 mg       Return for 4-5 wk f/u for BRVO w/CME OD w/DFE/OCT/likely inj. OD.  This document serves as a record of services personally performed by Karie Chimera, MD, PhD. It was created on their behalf by Annalee Genta, COMT. The creation of this record is the provider's dictation and/or activities during the visit.  Electronically signed by: Annalee Genta, COMT 09/27/20 12:53 AM  This document serves as a record of services personally performed by Karie Chimera, MD, PhD. It was created on their behalf by Greig Castilla  Baxley, COT an ophthalmic technician. The creation of this record is the provider's dictation and/or activities during the visit.    Electronically signed by: Cristopher Estimable, COT 9.21.22 @ 12:53 AM   Karie Chimera, M.D., Ph.D. Diseases & Surgery of the Retina and Vitreous [facility] 9.21.22  I have reviewed the above documentation for accuracy and completeness, and I agree with the above. Karie Chimera, M.D., Ph.D. 09/27/20 12:53 AM  Abbreviations: M myopia (nearsighted); A astigmatism; H hyperopia (farsighted); P presbyopia; Mrx spectacle prescription;  CTL contact lenses; OD right eye; OS left eye; OU both eyes  XT exotropia; ET esotropia; PEK punctate epithelial keratitis; PEE punctate epithelial erosions; DES dry eye syndrome; MGD meibomian gland dysfunction; ATs artificial tears; PFAT's preservative free artificial tears; NSC nuclear sclerotic cataract; PSC posterior subcapsular cataract; ERM epi-retinal membrane; PVD posterior vitreous detachment; RD retinal detachment; DM diabetes mellitus; DR diabetic retinopathy; NPDR non-proliferative diabetic retinopathy; PDR proliferative diabetic retinopathy; CSME clinically significant macular edema; DME diabetic macular edema; dbh dot blot hemorrhages; CWS cotton wool spot; POAG primary open angle glaucoma; C/D cup-to-disc ratio; HVF humphrey visual field; GVF  goldmann visual field; OCT optical coherence tomography; IOP intraocular pressure; BRVO Branch retinal vein occlusion; CRVO central retinal vein occlusion; CRAO central retinal artery occlusion; BRAO branch retinal artery occlusion; RT retinal tear; SB scleral buckle; PPV pars plana vitrectomy; VH Vitreous hemorrhage; PRP panretinal laser photocoagulation; IVK intravitreal kenalog; VMT vitreomacular traction; MH Macular hole;  NVD neovascularization of the disc; NVE neovascularization elsewhere; AREDS age related eye disease study; ARMD age related macular degeneration; POAG primary open angle glaucoma; EBMD epithelial/anterior basement membrane dystrophy; ACIOL anterior chamber intraocular lens; IOL intraocular lens; PCIOL posterior chamber intraocular lens; Phaco/IOL phacoemulsification with intraocular lens placement; PRK photorefractive keratectomy; LASIK laser assisted in situ keratomileusis; HTN hypertension; DM diabetes mellitus; COPD chronic obstructive pulmonary disease

## 2020-09-23 ENCOUNTER — Other Ambulatory Visit: Payer: Medicare Other | Admitting: Urology

## 2020-09-23 ENCOUNTER — Other Ambulatory Visit: Payer: Self-pay

## 2020-09-23 ENCOUNTER — Ambulatory Visit (INDEPENDENT_AMBULATORY_CARE_PROVIDER_SITE_OTHER): Payer: Medicare Other | Admitting: Ophthalmology

## 2020-09-23 DIAGNOSIS — H3581 Retinal edema: Secondary | ICD-10-CM

## 2020-09-23 DIAGNOSIS — I10 Essential (primary) hypertension: Secondary | ICD-10-CM | POA: Diagnosis not present

## 2020-09-23 DIAGNOSIS — E113213 Type 2 diabetes mellitus with mild nonproliferative diabetic retinopathy with macular edema, bilateral: Secondary | ICD-10-CM

## 2020-09-23 DIAGNOSIS — Z961 Presence of intraocular lens: Secondary | ICD-10-CM

## 2020-09-23 DIAGNOSIS — H34831 Tributary (branch) retinal vein occlusion, right eye, with macular edema: Secondary | ICD-10-CM | POA: Diagnosis not present

## 2020-09-23 DIAGNOSIS — H35033 Hypertensive retinopathy, bilateral: Secondary | ICD-10-CM | POA: Diagnosis not present

## 2020-09-23 DIAGNOSIS — H35371 Puckering of macula, right eye: Secondary | ICD-10-CM

## 2020-09-25 ENCOUNTER — Other Ambulatory Visit: Payer: Self-pay

## 2020-09-25 ENCOUNTER — Ambulatory Visit (INDEPENDENT_AMBULATORY_CARE_PROVIDER_SITE_OTHER): Payer: Medicare Other | Admitting: Urology

## 2020-09-25 VITALS — BP 110/66 | HR 80 | Ht 63.0 in | Wt 160.0 lb

## 2020-09-25 DIAGNOSIS — R300 Dysuria: Secondary | ICD-10-CM | POA: Diagnosis not present

## 2020-09-25 DIAGNOSIS — N390 Urinary tract infection, site not specified: Secondary | ICD-10-CM

## 2020-09-25 LAB — URINALYSIS, COMPLETE
Bilirubin, UA: NEGATIVE
Glucose, UA: NEGATIVE
Ketones, UA: NEGATIVE
Leukocytes,UA: NEGATIVE
Nitrite, UA: NEGATIVE
Protein,UA: NEGATIVE
RBC, UA: NEGATIVE
Specific Gravity, UA: 1.015 (ref 1.005–1.030)
Urobilinogen, Ur: 0.2 mg/dL (ref 0.2–1.0)
pH, UA: 5.5 (ref 5.0–7.5)

## 2020-09-25 LAB — MICROSCOPIC EXAMINATION: Bacteria, UA: NONE SEEN

## 2020-09-25 MED ORDER — SULFAMETHOXAZOLE-TRIMETHOPRIM 400-80 MG PO TABS: 1.0000 | ORAL_TABLET | Freq: Every day | ORAL | 0 refills | Status: DC

## 2020-09-27 ENCOUNTER — Encounter (INDEPENDENT_AMBULATORY_CARE_PROVIDER_SITE_OTHER): Payer: Self-pay | Admitting: Ophthalmology

## 2020-09-27 MED ORDER — AFLIBERCEPT 2MG/0.05ML IZ SOLN FOR KALEIDOSCOPE
2.0000 mg | INTRAVITREAL | Status: AC | PRN
  Administered 2020-09-23: 2 mg via INTRAVITREAL

## 2020-09-27 NOTE — Progress Notes (Signed)
   09/27/20  CC:  Chief Complaint  Patient presents with   Cysto    HPI: Recurrent UTI, CT March 2022 with small foci of gas in bladder.  CT 08/11/2020 with a 7 cm segment wall thickening of sigmoid colon  Blood pressure 110/66, pulse 80, height 5\' 3"  (1.6 m), weight 160 lb (72.6 kg). NED. A&Ox3.   No respiratory distress   Abd soft, NT, ND Atrophic external genitalia with patent urethral meatus  Cystoscopy Procedure Note  Patient identification was confirmed, informed consent was obtained, and patient was prepped using Betadine solution.  Lidocaine jelly was administered per urethral meatus.    Procedure: - Flexible cystoscope introduced, without any difficulty.   - Thorough search of the bladder revealed:    normal urethral meatus    Inflammatory tissue with bullous edema anterior bladder wall; no erythema    no stones    no ulcers     no tumors    no urethral polyps    no trabeculation  - Ureteral orifices were normal in position and appearance.  Post-Procedure: - Patient tolerated the procedure well  Assessment/ Plan: Abnormal appearing bladder mucosa, history recurrent UTI Further imaging to evaluate for colovesical fistula Schedule biopsy of abnormal mucosa     , MD

## 2020-10-04 ENCOUNTER — Encounter: Payer: Self-pay | Admitting: Urology

## 2020-10-16 ENCOUNTER — Other Ambulatory Visit: Payer: Self-pay

## 2020-10-16 ENCOUNTER — Inpatient Hospital Stay: Payer: Medicare Other

## 2020-10-16 ENCOUNTER — Inpatient Hospital Stay: Payer: Medicare Other | Attending: Internal Medicine

## 2020-10-16 ENCOUNTER — Inpatient Hospital Stay (HOSPITAL_BASED_OUTPATIENT_CLINIC_OR_DEPARTMENT_OTHER): Payer: Medicare Other | Admitting: Nurse Practitioner

## 2020-10-16 ENCOUNTER — Other Ambulatory Visit: Payer: Self-pay | Admitting: Emergency Medicine

## 2020-10-16 VITALS — BP 129/61 | HR 76 | Temp 98.1°F | Resp 16 | Wt 165.6 lb

## 2020-10-16 DIAGNOSIS — Z79899 Other long term (current) drug therapy: Secondary | ICD-10-CM | POA: Insufficient documentation

## 2020-10-16 DIAGNOSIS — I129 Hypertensive chronic kidney disease with stage 1 through stage 4 chronic kidney disease, or unspecified chronic kidney disease: Secondary | ICD-10-CM | POA: Insufficient documentation

## 2020-10-16 DIAGNOSIS — D631 Anemia in chronic kidney disease: Secondary | ICD-10-CM

## 2020-10-16 DIAGNOSIS — N183 Chronic kidney disease, stage 3 unspecified: Secondary | ICD-10-CM

## 2020-10-16 DIAGNOSIS — N189 Chronic kidney disease, unspecified: Secondary | ICD-10-CM | POA: Insufficient documentation

## 2020-10-16 DIAGNOSIS — E782 Mixed hyperlipidemia: Secondary | ICD-10-CM

## 2020-10-16 DIAGNOSIS — E1122 Type 2 diabetes mellitus with diabetic chronic kidney disease: Secondary | ICD-10-CM | POA: Diagnosis not present

## 2020-10-16 DIAGNOSIS — I739 Peripheral vascular disease, unspecified: Secondary | ICD-10-CM

## 2020-10-16 DIAGNOSIS — E1159 Type 2 diabetes mellitus with other circulatory complications: Secondary | ICD-10-CM

## 2020-10-16 LAB — IRON AND TIBC
Iron: 72 ug/dL (ref 28–170)
Saturation Ratios: 19 % (ref 10.4–31.8)
TIBC: 372 ug/dL (ref 250–450)
UIBC: 300 ug/dL

## 2020-10-16 LAB — COMPREHENSIVE METABOLIC PANEL
ALT: 19 U/L (ref 0–44)
AST: 22 U/L (ref 15–41)
Albumin: 3.6 g/dL (ref 3.5–5.0)
Alkaline Phosphatase: 46 U/L (ref 38–126)
Anion gap: 7 (ref 5–15)
BUN: 38 mg/dL — ABNORMAL HIGH (ref 8–23)
CO2: 21 mmol/L — ABNORMAL LOW (ref 22–32)
Calcium: 9.2 mg/dL (ref 8.9–10.3)
Chloride: 105 mmol/L (ref 98–111)
Creatinine, Ser: 1.94 mg/dL — ABNORMAL HIGH (ref 0.44–1.00)
GFR, Estimated: 27 mL/min — ABNORMAL LOW (ref >60)
Glucose, Bld: 195 mg/dL — ABNORMAL HIGH (ref 70–99)
Potassium: 5.1 mmol/L (ref 3.5–5.1)
Sodium: 133 mmol/L — ABNORMAL LOW (ref 135–145)
Total Bilirubin: 0.6 mg/dL (ref 0.3–1.2)
Total Protein: 6.8 g/dL (ref 6.5–8.1)

## 2020-10-16 LAB — CBC WITH DIFFERENTIAL/PLATELET
Abs Immature Granulocytes: 0.01 10*3/uL (ref 0.00–0.07)
Basophils Absolute: 0 10*3/uL (ref 0.0–0.1)
Basophils Relative: 1 %
Eosinophils Absolute: 0.2 10*3/uL (ref 0.0–0.5)
Eosinophils Relative: 4 %
HCT: 28.8 % — ABNORMAL LOW (ref 36.0–46.0)
Hemoglobin: 9.3 g/dL — ABNORMAL LOW (ref 12.0–15.0)
Immature Granulocytes: 0 %
Lymphocytes Relative: 22 %
Lymphs Abs: 1.3 10*3/uL (ref 0.7–4.0)
MCH: 29.5 pg (ref 26.0–34.0)
MCHC: 32.3 g/dL (ref 30.0–36.0)
MCV: 91.4 fL (ref 80.0–100.0)
Monocytes Absolute: 0.7 10*3/uL (ref 0.1–1.0)
Monocytes Relative: 11 %
Neutro Abs: 3.8 10*3/uL (ref 1.7–7.7)
Neutrophils Relative %: 62 %
Platelets: 262 10*3/uL (ref 150–400)
RBC: 3.15 MIL/uL — ABNORMAL LOW (ref 3.87–5.11)
RDW: 16 % — ABNORMAL HIGH (ref 11.5–15.5)
WBC: 6.1 10*3/uL (ref 4.0–10.5)
nRBC: 0 % (ref 0.0–0.2)

## 2020-10-16 LAB — LIPID PANEL
Cholesterol: 126 mg/dL (ref 0–200)
HDL: 38 mg/dL — ABNORMAL LOW (ref >40)
LDL Cholesterol: 23 mg/dL (ref 0–99)
Total CHOL/HDL Ratio: 3.3 RATIO
Triglycerides: 325 mg/dL — ABNORMAL HIGH (ref <150)
VLDL: 65 mg/dL — ABNORMAL HIGH (ref 0–40)

## 2020-10-16 LAB — FERRITIN: Ferritin: 15 ng/mL (ref 11–307)

## 2020-10-16 MED ORDER — SODIUM CHLORIDE 0.9 % IV SOLN
Freq: Once | INTRAVENOUS | Status: AC
  Filled 2020-10-16: qty 250

## 2020-10-16 MED ORDER — IRON SUCROSE 20 MG/ML IV SOLN
200.0000 mg | Freq: Once | INTRAVENOUS | Status: AC
  Administered 2020-10-16: 200 mg via INTRAVENOUS
  Filled 2020-10-16: qty 10

## 2020-10-16 MED ORDER — HEPARIN SOD (PORK) LOCK FLUSH 100 UNIT/ML IV SOLN
INTRAVENOUS | Status: AC
  Filled 2020-10-16: qty 5

## 2020-10-16 NOTE — Progress Notes (Signed)
[facility] NOTE  Patient Care Team: Rusty Aus, MD as PCP - General (Internal Medicine) Josefine Class, MD as Referring Physician (Gastroenterology) Cammie Sickle, MD as Consulting Physician (Hematology and Oncology)  CHIEF COMPLAINTS/PURPOSE OF CONSULTATION: Anemia  HEMATOLOGY HISTORY  # ANEMIA- Jan 2021- 8.8/ferritin 11 [PCP]; N-WBC/platelets? IDA vs other- EGD-2015/colonoscopy-? 2015; 2020- [Dr.Skulskie] ; capsule-2016- ? 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  female with iron deficiency anemia, question CKD 3, who returns to clinic for labs, further evaluation, and possible IV iron. She complains of general fatigue and intermittent numb hands. Denies black or blood stools. She last received venofer x 2 07/10/20 and 08/06/20.    Review of Systems  Constitutional:  Positive for malaise/fatigue. Negative for chills, diaphoresis and fever.  HENT:  Negative for nosebleeds and sore throat.   Eyes:  Negative for double vision.  Respiratory:  Positive for shortness of breath. Negative for cough, hemoptysis, sputum production and wheezing.   Cardiovascular:  Negative for chest pain, palpitations, orthopnea and leg swelling.  Gastrointestinal:  Negative for abdominal pain, blood in stool, constipation, diarrhea, heartburn, melena, nausea and vomiting.  Genitourinary:  Negative for dysuria, frequency and urgency.  Musculoskeletal:  Positive for joint pain.  Skin: Negative.  Negative for itching and rash.  Neurological:  Negative for dizziness, tingling, focal weakness, weakness and headaches.  Endo/Heme/Allergies:  Does not bruise/bleed easily.  Psychiatric/Behavioral:  Negative for depression. The patient is not nervous/anxious and does not have insomnia.    MEDICAL HISTORY:   Past Medical History:  Diagnosis Date   Anemia    Anxiety    Arthritis    Gout   Cataracts, both eyes    Diabetic retinopathy ([place])    NPDR OU   Diabetic retinopathy ([place])    GERD (gastroesophageal reflux disease)    Gout    Headache    h/o migraines   History of fracture of patella    right knee   History of positive PPD    Patient always shows positive   Hyperlipidemia    Hypertension    Hypertensive retinopathy    OU   Hypothyroidism    Lichen sclerosus 42/59/5638   of vulva   Metatarsal fracture    Neuropathy    Peripheral vascular disease ([facility])    Polyneuropathy    numbness and tingling in feet and toes   Renal insufficiency    Stage 3   Sleep apnea    does not use cpap-lost weight    Type 2 diabetes mellitus, uncontrolled     SURGICAL HISTORY: Past Surgical History:  Procedure Laterality Date   ABDOMINAL HYSTERECTOMY     AMPUTATION TOE Right 05/08/2020   Procedure: AMPUTATION TOE-Right 4th Toe;  Surgeon: Caroline More, DPM;  Location: [facility];  Service: Podiatry;  Laterality: Right;   APPENDECTOMY     BREAST REDUCTION SURGERY  2001   CATARACT EXTRACTION     CESAREAN SECTION  1976   COLONOSCOPY  03/05/2013   Nml - due for repeat 03/06/2018   COLONOSCOPY WITH PROPOFOL N/A 03/18/2019   Procedure: COLONOSCOPY WITH PROPOFOL;  Surgeon: Toledo, Benay Pike, MD;  Location: [facility];  Service: Gastroenterology;  Laterality: N/A;   DIAGNOSTIC LAPAROSCOPY     DILATION AND CURETTAGE OF UTERUS  1989   ENDARTERECTOMY  FEMORAL Bilateral 03/09/2018   Procedure: ENDARTERECTOMY FEMORAL;  Surgeon: Algernon Huxley, MD;  Location: [facility];  Service: Vascular;  Laterality: Bilateral;   ENDARTERECTOMY POPLITEAL Left 03/09/2018   Procedure: ENDARTERECTOMY POPLITEAL AND SFA;  Surgeon: Algernon Huxley, MD;  Location: [facility];  Service: Vascular;  Laterality: Left;   ESOPHAGOGASTRODUODENOSCOPY  03/05/2013   ESOPHAGOGASTRODUODENOSCOPY (EGD) WITH PROPOFOL N/A 03/18/2019   Procedure:  ESOPHAGOGASTRODUODENOSCOPY (EGD) WITH PROPOFOL;  Surgeon: Toledo, Benay Pike, MD;  Location: [facility];  Service: Gastroenterology;  Laterality: N/A;   EYE SURGERY     Eyelid Surgery  2012   INTRAMEDULLARY (IM) NAIL INTERTROCHANTERIC Left 10/30/2015   Procedure: INTRAMEDULLARY (IM) NAIL INTERTROCHANTRIC ;  Surgeon: Hessie Knows, MD;  Location: [facility];  Service: Orthopedics;  Laterality: Left;   KYPHOPLASTY N/A 10/25/2018   Procedure: L4 KYPHOPLASTY;  Surgeon: Hessie Knows, MD;  Location: [facility];  Service: Orthopedics;  Laterality: N/A;   LAPAROSCOPIC HYSTERECTOMY  2000   total   LOWER EXTREMITY ANGIOGRAPHY Left 03/08/2017   Procedure: LOWER EXTREMITY ANGIOGRAPHY;  Surgeon: Algernon Huxley, MD;  Location: [place] [facility];  Service: Cardiovascular;  Laterality: Left;   LOWER EXTREMITY ANGIOGRAPHY Left 10/30/2017   Procedure: LOWER EXTREMITY ANGIOGRAPHY;  Surgeon: Algernon Huxley, MD;  Location: [place] [facility];  Service: Cardiovascular;  Laterality: Left;   LOWER EXTREMITY ANGIOGRAPHY Right 03/08/2018   Procedure: LOWER EXTREMITY ANGIOGRAPHY;  Surgeon: Algernon Huxley, MD;  Location: [place] [facility];  Service: Cardiovascular;  Laterality: Right;   LOWER EXTREMITY ANGIOGRAPHY Left 10/01/2018   Procedure: LOWER EXTREMITY ANGIOGRAPHY;  Surgeon: Algernon Huxley, MD;  Location: [place] [facility];  Service: Cardiovascular;  Laterality: Left;   LOWER EXTREMITY ANGIOGRAPHY Right 10/08/2018   Procedure: LOWER EXTREMITY ANGIOGRAPHY;  Surgeon: Algernon Huxley, MD;  Location: [place] [facility];  Service: Cardiovascular;  Laterality: Right;   LOWER EXTREMITY ANGIOGRAPHY Right 05/07/2020   Procedure: Lower Extremity Angiography;  Surgeon: Algernon Huxley, MD;  Location: [facility];  Service: Cardiovascular;  Laterality: Right;   PERIPHERAL VASCULAR INTERVENTION  03/08/2018   Procedure: PERIPHERAL VASCULAR INTERVENTION;  Surgeon: Algernon Huxley, MD;  Location: [place] [facility];  Service:  Cardiovascular;;   PORTA CATH INSERTION N/A 02/17/2020   Procedure: PORTA CATH INSERTION;  Surgeon: Algernon Huxley, MD;  Location: [place] [facility];  Service: Cardiovascular;  Laterality: N/A;   REDUCTION MAMMAPLASTY  1997   SACROPLASTY N/A 10/25/2018   Procedure: S1 SACROPLASTY;  Surgeon: Hessie Knows, MD;  Location: [facility];  Service: Orthopedics;  Laterality: N/A;    SOCIAL HISTORY: Social History   Socioeconomic History   Marital status: Married    Spouse name: [name]   Number of children: 3   Years of education: Not on file   Highest education level: Not on file  Occupational History   Occupation: Retail banker  Tobacco Use   Smoking status: Former    Packs/day: 1.00    Years: 20.00    Pack years: 20.00    Types: Cigarettes    Quit date: 03/07/1996    Years since quitting: 24.6   Smokeless tobacco: Never   Tobacco comments:    started smoking at age 54  Vaping Use   Vaping Use: Never used  Substance and Sexual Activity   Alcohol use: No    Alcohol/week: 0.0 standard drinks   Drug use: No   Sexual activity: Yes    Partners: Male    Birth  control/protection: Surgical, Post-menopausal  Other Topics Concern   Not on file  Social History Narrative   Lives in [place]; with husband; quit > 20 years; no alcohol; used to work at [employer] at [employer].    Social Determinants of Health   Financial Resource Strain: Not on file  Food Insecurity: Not on file  Transportation Needs: Not on file  Physical Activity: Not on file  Stress: Not on file  Social Connections: Not on file  Intimate Partner Violence: Not on file    FAMILY HISTORY: Family History  Problem Relation Age of Onset   Coronary artery disease Father    Heart attack Father    Coronary artery disease Mother    Heart attack Mother    Ovarian cancer Sister 66       sister had hormonal therapy for IVF txs-which increased risk factor for ovarian cancer   Breast cancer Neg Hx     ALLERGIES:  is  allergic to ace inhibitors.  MEDICATIONS:  Current Outpatient Medications  Medication Sig Dispense Refill   ALPRAZolam (XANAX) 0.25 MG tablet Take 0.25 mg by mouth daily as needed for anxiety or sleep.      amLODipine (NORVASC) 5 MG tablet Take 5 mg by mouth daily.     aspirin EC 81 MG tablet Take 81 mg by mouth daily.      cholecalciferol (VITAMIN D) 1000 units tablet Take 1,000 Units by mouth 2 (two) times daily.     CORAL CALCIUM PO Take 1 tablet by mouth 2 (two) times daily.      denosumab (PROLIA) 60 MG/ML SOLN injection Inject 60 mg into the skin every 6 (six) months.      escitalopram (LEXAPRO) 10 MG tablet Take 10 mg by mouth daily.     estradiol (ESTRACE) 0.1 MG/GM vaginal cream Apply one pea-sized amount around the opening of the urethra daily for two weeks, then three times weekly thereafter. 42.5 g 5   famotidine (PEPCID) 40 MG tablet Take 40 mg by mouth daily.     furosemide (LASIX) 20 MG tablet Take 20 mg by mouth daily as needed for fluid or edema.      levothyroxine (SYNTHROID, LEVOTHROID) 100 MCG tablet Take 100 mcg by mouth daily before breakfast.   3   lidocaine-prilocaine (EMLA) cream Apply 1 application topically as needed (apply prior to port a cath access). 30 g 3   Magnesium 500 MG TABS Take 500 mg by mouth every morning.      metFORMIN (GLUCOPHAGE) 1000 MG tablet Take 1 tablet by mouth 2 (two) times daily with a meal.     Multiple Vitamin (MULTIVITAMIN WITH MINERALS) TABS tablet Take 1 tablet by mouth daily.     nystatin cream (MYCOSTATIN) Apply 1 application topically 2 (two) times daily.     olmesartan (BENICAR) 20 MG tablet Take 20 mg by mouth daily.     pantoprazole (PROTONIX) 40 MG tablet Take 40 mg by mouth every morning.      rivaroxaban (XARELTO) 20 MG TABS tablet Take 1 tablet (20 mg total) by mouth daily with supper. 30 tablet 11   rosuvastatin (CRESTOR) 20 MG tablet Take 20 mg by mouth every morning.     sulfamethoxazole-trimethoprim (BACTRIM) 400-80 MG  tablet Take 1 tablet by mouth daily. 60 tablet 0   TRESIBA FLEXTOUCH 200 UNIT/ML SOPN Inject 30 Units as directed at bedtime.   5   vitamin B-12 (CYANOCOBALAMIN) 100 MCG tablet Take 100 mcg by mouth daily.  vitamin E 400 UNIT capsule Take 400 Units by mouth daily.     zolpidem (AMBIEN) 10 MG tablet Take 10 mg by mouth at bedtime as needed for sleep.     metoprolol succinate (TOPROL-XL) 50 MG 24 hr tablet Take 50 mg by mouth daily.     mirtazapine (REMERON) 15 MG tablet Take 15 mg by mouth at bedtime.     No current facility-administered medications for this visit.    PHYSICAL EXAMINATION: Vitals:   10/16/20 1400  BP: 129/61  Pulse: 76  Resp: 16  Temp: 98.1 F (36.7 C)  SpO2: 99%   Filed Weights   10/16/20 1400  Weight: 165 lb 9.6 oz (75.1 kg)    Physical Exam Constitutional:      Appearance: She is not ill-appearing.     Comments: Alone.  Ambulating independently.  Cardiovascular:     Rate and Rhythm: Normal rate and regular rhythm.  Pulmonary:     Effort: Pulmonary effort is normal. No respiratory distress.     Breath sounds: Normal breath sounds. No wheezing.  Abdominal:     General: There is no distension.     Palpations: Abdomen is soft. There is no mass.     Tenderness: There is no abdominal tenderness. There is no guarding or rebound.  Musculoskeletal:        General: No tenderness or deformity.     Cervical back: Neck supple.  Skin:    General: Skin is warm.     Coloration: Skin is not pale.  Neurological:     Mental Status: She is alert and oriented to person, place, and time.  Psychiatric:        Mood and Affect: Mood and affect normal.        Behavior: Behavior normal.    LABORATORY DATA:  I have reviewed the data as listed Lab Results  Component Value Date   WBC 6.1 10/16/2020   HGB 9.3 (L) 10/16/2020   HCT 28.8 (L) 10/16/2020   MCV 91.4 10/16/2020   PLT 262 10/16/2020   CMP Latest Ref Rng & Units 10/16/2020 08/12/2020 08/11/2020  Glucose 70 -  99 mg/dL 195(H) 102(H) 99  BUN 8 - 23 mg/dL 38(H) 23 33(H)  Creatinine 0.44 - 1.00 mg/dL 1.94(H) 1.74(H) 2.15(H)  Sodium 135 - 145 mmol/L 133(L) 141 135  Potassium 3.5 - 5.1 mmol/L 5.1 4.9 5.0  Chloride 98 - 111 mmol/L 105 110 106  CO2 22 - 32 mmol/L 21(L) 23 25  Calcium 8.9 - 10.3 mg/dL 9.2 8.9 8.8(L)  Total Protein 6.5 - 8.1 g/dL 6.8 - 6.8  Total Bilirubin 0.3 - 1.2 mg/dL 0.6 - 0.7  Alkaline Phos 38 - 126 U/L 46 - 45  AST 15 - 41 U/L 22 50(H) 48(H)  ALT 0 - 44 U/L 19 - 30   Iron/TIBC/Ferritin/ %Sat    Component Value Date/Time   IRON 72 10/16/2020 1329   TIBC 372 10/16/2020 1329   FERRITIN 15 10/16/2020 1329   IRONPCTSAT 19 10/16/2020 1329      Intravitreal Injection, Pharmacologic Agent - OD - Right Eye  Result Date: 09/27/2020 Time Out 09/23/2020. 3:24 PM. Confirmed correct patient, procedure, site, and patient consented. Anesthesia Topical anesthesia was used. Anesthetic medications included Lidocaine 2%, Proparacaine 0.5%. Procedure Preparation included 5% betadine to ocular surface, eyelid speculum. A (32g) needle was used. Injection: 2 mg aflibercept 2 MG/0.05ML   Route: Intravitreal, Site: Right Eye   NDC: [id number], Lot: [id number], Expiration date: 07/02/2020, Waste:  0.05 mL Post-op Post injection exam found visual acuity of at least counting fingers. The patient tolerated the procedure well. There were no complications. The patient received written and verbal post procedure care education. Post injection medications were not given.   OCT, Retina - OU - Both Eyes  Result Date: 09/27/2020 Right Eye Quality was good. Central Foveal Thickness: 334. Progression has been stable. Findings include abnormal foveal contour, epiretinal membrane, no SRF, intraretinal fluid (Trace persistent cystic changes IT fovea). Left Eye Quality was good. Central Foveal Thickness: 281. Progression has been stable. Findings include normal foveal contour, no IRF, no SRF (Trace ERM). Notes *Images  captured and stored on drive Diagnosis / Impression: OD: BRVO -- Trace persistent cystic changes IT fovea OS: NFP; no IRF/SRF--stable, trace ERM Clinical management: See below Abbreviations: NFP - Normal foveal profile. CME - cystoid macular edema. PED - pigment epithelial detachment. IRF - intraretinal fluid. SRF - subretinal fluid. EZ - ellipsoid zone. ERM - epiretinal membrane. ORA - outer retinal atrophy. ORT - outer retinal tubulation. SRHM - subretinal hyper-reflective material    Assessment & Plan:   1.  Anemia-likely secondary to CKD 3 and iron deficiency.  Today, ferritin is 15, iron saturation 19%.  Hemoglobin improved to 9.3.  Proceed with Venofer today.  2. CKD- GFR 27. Worse. Encouraged hydration. Follow up with Dr. Juleen China. Consider retacrit at next visit.    3. Diabetes -BG-195. Managed by Dr. Gabriel Carina. Stable. 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    4. Poor IV access/Mediport placement-stable    DISPOSITION:  Venofer x 2 3 months- labs. Next day follow up with Dr. Rogue Bussing and possible iron  No problem-specific Assessment & Plan notes found for this encounter.  All questions were answered. The patient knows to call the clinic with any problems, questions or concerns.    Verlon Au, NP 10/16/2020

## 2020-10-16 NOTE — Progress Notes (Signed)
Pt reports that recently her hands have been "falling asleep" but will quickly return to normal. No other concerns/complaints at this time

## 2020-10-19 LAB — HEMOGLOBIN A1C
Hgb A1c MFr Bld: 6 % — ABNORMAL HIGH (ref 4.8–5.6)
Mean Plasma Glucose: 126 mg/dL

## 2020-10-20 ENCOUNTER — Telehealth: Payer: Self-pay

## 2020-10-20 DIAGNOSIS — R3989 Other symptoms and signs involving the genitourinary system: Secondary | ICD-10-CM

## 2020-10-20 DIAGNOSIS — N321 Vesicointestinal fistula: Secondary | ICD-10-CM

## 2020-10-20 DIAGNOSIS — R948 Abnormal results of function studies of other organs and systems: Secondary | ICD-10-CM

## 2020-10-20 DIAGNOSIS — N390 Urinary tract infection, site not specified: Secondary | ICD-10-CM

## 2020-10-20 NOTE — Telephone Encounter (Signed)
Pt calls triage line and states that she has not heard from radiology in regards to getting her imaging scheduled. I reviewed provider's last note and it does appear that the patient was supposed to have imaging however it was not ordered. Can you please determine what imaging the patient needs so that order can be placed.   Secondly, patient states that she has seen abnormal labs results via Mychart and states she would like results. Most recent labs appear to be from Dr. Donneta Romberg, advised pt that his office will need to contact her with results. Will also reach out to the cancer center to make them aware pt is requesting a call.

## 2020-10-20 NOTE — Progress Notes (Signed)
[facility] Note  10/21/2020     CHIEF COMPLAINT Patient presents for Retina Follow Up   HISTORY OF PRESENT ILLNESS: Cheryl Leonard is a 75 y.o. female who presents to the clinic today for:   HPI     Retina Follow Up   Patient presents with  CRVO/BRVO.  In right eye.  This started 4 weeks ago.  I, the attending physician,  performed the HPI with the patient and updated documentation appropriately.        Comments   Patient here for 4 weeks retina follow up for BRVO w/CME OD. Patient states vision doing ok. Sees double at times. Happened before then stopped. It happened again yesterday. No eye pain.       Last edited by Bernarda Caffey, MD on 10/23/2020 12:41 PM.     Referring physician: Rusty Aus, [place],  [place] [id number]  HISTORICAL INFORMATION:   Selected notes from the MEDICAL RECORD NUMBER Referred by Dr. Marvel Plan for concern of DME OD Lab Results  Component Value Date   HGBA1C 6.0 (H) 10/16/2020       CURRENT MEDICATIONS: No current outpatient medications on file. (Ophthalmic Drugs)   No current facility-administered medications for this visit. (Ophthalmic Drugs)   Current Outpatient Medications (Other)  Medication Sig   ALPRAZolam (XANAX) 0.25 MG tablet Take 0.25 mg by mouth daily as needed for anxiety or sleep.    amLODipine (NORVASC) 5 MG tablet Take 5 mg by mouth daily.   aspirin EC 81 MG tablet Take 81 mg by mouth daily.    cholecalciferol (VITAMIN D) 1000 units tablet Take 1,000 Units by mouth 2 (two) times daily.   CORAL CALCIUM PO Take 1 tablet by mouth 2 (two) times daily.    denosumab (PROLIA) 60 MG/ML SOLN injection Inject 60 mg into the skin every 6 (six) months.    escitalopram (LEXAPRO) 10 MG tablet Take 10 mg by mouth daily.   estradiol (ESTRACE) 0.1 MG/GM vaginal cream Apply one pea-sized amount around the opening of the urethra daily for two  weeks, then three times weekly thereafter.   famotidine (PEPCID) 40 MG tablet Take 40 mg by mouth daily.   furosemide (LASIX) 20 MG tablet Take 20 mg by mouth daily as needed for fluid or edema.    levothyroxine (SYNTHROID, LEVOTHROID) 100 MCG tablet Take 100 mcg by mouth daily before breakfast.    lidocaine-prilocaine (EMLA) cream Apply 1 application topically as needed (apply prior to port a cath access).   Magnesium 500 MG TABS Take 500 mg by mouth every morning.    metFORMIN (GLUCOPHAGE) 1000 MG tablet Take 1 tablet by mouth 2 (two) times daily with a meal.   metoprolol succinate (TOPROL-XL) 50 MG 24 hr tablet Take 50 mg by mouth daily.   mirtazapine (REMERON) 15 MG tablet Take 15 mg by mouth at bedtime.   Multiple Vitamin (MULTIVITAMIN WITH MINERALS) TABS tablet Take 1 tablet by mouth daily.   nystatin cream (MYCOSTATIN) Apply 1 application topically 2 (two) times daily.   olmesartan (BENICAR) 20 MG tablet Take 20 mg by mouth daily.   pantoprazole (PROTONIX) 40 MG tablet Take 40 mg by mouth every morning.    rivaroxaban (XARELTO) 20 MG TABS tablet Take 1 tablet (20 mg total) by mouth daily with supper.   rosuvastatin (CRESTOR) 20 MG tablet Take 20 mg by mouth every morning.   sulfamethoxazole-trimethoprim (BACTRIM) 400-80  MG tablet Take 1 tablet by mouth daily.   TRESIBA FLEXTOUCH 200 UNIT/ML SOPN Inject 30 Units as directed at bedtime.    vitamin B-12 (CYANOCOBALAMIN) 100 MCG tablet Take 100 mcg by mouth daily.   vitamin E 400 UNIT capsule Take 400 Units by mouth daily.   zolpidem (AMBIEN) 10 MG tablet Take 10 mg by mouth at bedtime as needed for sleep.   No current facility-administered medications for this visit. (Other)   REVIEW OF SYSTEMS: ROS   Positive for: Neurological, Genitourinary, Musculoskeletal, Endocrine, Eyes Negative for: Constitutional, Gastrointestinal, Skin, HENT, Cardiovascular, Respiratory, Psychiatric, Allergic/Imm, Heme/Lymph Last edited by Theodore Demark,  COA on 10/21/2020  1:56 PM.    ALLERGIES Allergies  Allergen Reactions   Ace Inhibitors Other (See Comments)   PAST MEDICAL HISTORY Past Medical History:  Diagnosis Date   Anemia    Anxiety    Arthritis    Gout   Cataracts, both eyes    Diabetic retinopathy ([place])    NPDR OU   Diabetic retinopathy ([place])    GERD (gastroesophageal reflux disease)    Gout    Headache    h/o migraines   History of fracture of patella    right knee   History of positive PPD    Patient always shows positive   Hyperlipidemia    Hypertension    Hypertensive retinopathy    OU   Hypothyroidism    Lichen sclerosus 37/90/2409   of vulva   Metatarsal fracture    Neuropathy    Peripheral vascular disease (HCC)    Polyneuropathy    numbness and tingling in feet and toes   Renal insufficiency    Stage 3   Sleep apnea    does not use cpap-lost weight    Type 2 diabetes mellitus, uncontrolled    Past Surgical History:  Procedure Laterality Date   ABDOMINAL HYSTERECTOMY     AMPUTATION TOE Right 05/08/2020   Procedure: AMPUTATION TOE-Right 4th Toe;  Surgeon: Caroline More, DPM;  Location: [facility];  Service: Podiatry;  Laterality: Right;   APPENDECTOMY     BREAST REDUCTION SURGERY  2001   CATARACT EXTRACTION     CESAREAN SECTION  1976   COLONOSCOPY  03/05/2013   Nml - due for repeat 03/06/2018   COLONOSCOPY WITH PROPOFOL N/A 03/18/2019   Procedure: COLONOSCOPY WITH PROPOFOL;  Surgeon: Toledo, Benay Pike, MD;  Location: [facility];  Service: Gastroenterology;  Laterality: N/A;   DIAGNOSTIC LAPAROSCOPY     DILATION AND CURETTAGE OF UTERUS  1989   ENDARTERECTOMY FEMORAL Bilateral 03/09/2018   Procedure: ENDARTERECTOMY FEMORAL;  Surgeon: Algernon Huxley, MD;  Location: [facility];  Service: Vascular;  Laterality: Bilateral;   ENDARTERECTOMY POPLITEAL Left 03/09/2018   Procedure: ENDARTERECTOMY POPLITEAL AND SFA;  Surgeon: Algernon Huxley, MD;  Location: [facility];  Service: Vascular;  Laterality: Left;    ESOPHAGOGASTRODUODENOSCOPY  03/05/2013   ESOPHAGOGASTRODUODENOSCOPY (EGD) WITH PROPOFOL N/A 03/18/2019   Procedure: ESOPHAGOGASTRODUODENOSCOPY (EGD) WITH PROPOFOL;  Surgeon: Toledo, Benay Pike, MD;  Location: [facility];  Service: Gastroenterology;  Laterality: N/A;   EYE SURGERY     Eyelid Surgery  2012   INTRAMEDULLARY (IM) NAIL INTERTROCHANTERIC Left 10/30/2015   Procedure: INTRAMEDULLARY (IM) NAIL INTERTROCHANTRIC ;  Surgeon: Hessie Knows, MD;  Location: [facility];  Service: Orthopedics;  Laterality: Left;   KYPHOPLASTY N/A 10/25/2018   Procedure: L4 KYPHOPLASTY;  Surgeon: Hessie Knows, MD;  Location: [facility];  Service: Orthopedics;  Laterality: N/A;   LAPAROSCOPIC  HYSTERECTOMY  2000   total   LOWER EXTREMITY ANGIOGRAPHY Left 03/08/2017   Procedure: LOWER EXTREMITY ANGIOGRAPHY;  Surgeon: Algernon Huxley, MD;  Location: [place] [facility];  Service: Cardiovascular;  Laterality: Left;   LOWER EXTREMITY ANGIOGRAPHY Left 10/30/2017   Procedure: LOWER EXTREMITY ANGIOGRAPHY;  Surgeon: Algernon Huxley, MD;  Location: [place] [facility];  Service: Cardiovascular;  Laterality: Left;   LOWER EXTREMITY ANGIOGRAPHY Right 03/08/2018   Procedure: LOWER EXTREMITY ANGIOGRAPHY;  Surgeon: Algernon Huxley, MD;  Location: [place] [facility];  Service: Cardiovascular;  Laterality: Right;   LOWER EXTREMITY ANGIOGRAPHY Left 10/01/2018   Procedure: LOWER EXTREMITY ANGIOGRAPHY;  Surgeon: Algernon Huxley, MD;  Location: [place] [facility];  Service: Cardiovascular;  Laterality: Left;   LOWER EXTREMITY ANGIOGRAPHY Right 10/08/2018   Procedure: LOWER EXTREMITY ANGIOGRAPHY;  Surgeon: Algernon Huxley, MD;  Location: [place] [facility];  Service: Cardiovascular;  Laterality: Right;   LOWER EXTREMITY ANGIOGRAPHY Right 05/07/2020   Procedure: Lower Extremity Angiography;  Surgeon: Algernon Huxley, MD;  Location: [place] [facility];  Service: Cardiovascular;  Laterality: Right;   PERIPHERAL VASCULAR INTERVENTION  03/08/2018   Procedure:  PERIPHERAL VASCULAR INTERVENTION;  Surgeon: Algernon Huxley, MD;  Location: [place] [facility];  Service: Cardiovascular;;   PORTA CATH INSERTION N/A 02/17/2020   Procedure: PORTA CATH INSERTION;  Surgeon: Algernon Huxley, MD;  Location: [place] [facility];  Service: Cardiovascular;  Laterality: N/A;   REDUCTION MAMMAPLASTY  1997   SACROPLASTY N/A 10/25/2018   Procedure: S1 SACROPLASTY;  Surgeon: Hessie Knows, MD;  Location: [facility];  Service: Orthopedics;  Laterality: N/A;   FAMILY HISTORY Family History  Problem Relation Age of Onset   Coronary artery disease Father    Heart attack Father    Coronary artery disease Mother    Heart attack Mother    Ovarian cancer Sister 58       sister had hormonal therapy for IVF txs-which increased risk factor for ovarian cancer   Breast cancer Neg Hx    SOCIAL HISTORY Social History   Tobacco Use   Smoking status: Former    Packs/day: 1.00    Years: 20.00    Pack years: 20.00    Types: Cigarettes    Quit date: 03/07/1996    Years since quitting: 24.6   Smokeless tobacco: Never   Tobacco comments:    started smoking at age 23  Vaping Use   Vaping Use: Never used  Substance Use Topics   Alcohol use: No    Alcohol/week: 0.0 standard drinks   Drug use: No       OPHTHALMIC EXAM: Base Eye Exam     Visual Acuity (Snellen - Linear)       Right Left   Dist [place] 20/30 20/20   Dist ph [place] NI          Tonometry (Tonopen, 1:53 PM)       Right Left   Pressure 18 16         Pupils       Dark Light Shape React APD   Right 3 2 Round Minimal None   Left 3 2 Round Minimal None         Visual Fields (Counting fingers)       Left Right    Full Full         Extraocular Movement       Right Left    Full Full  Neuro/Psych     Oriented x3: Yes   Mood/Affect: Normal         Dilation     Both eyes: 1.0% Mydriacyl, 2.5% Phenylephrine @ 1:53 PM           Slit Lamp and Fundus Exam     External Exam        Right Left   External Normal Normal         Slit Lamp Exam       Right Left   Lids/Lashes dermatochalasis dermatochalasis   Conjunctiva/Sclera White and quiet White and quiet   Cornea arcus; well healed cataract wound; 2-3+ diffuse Punctate epithelial erosions, decreased TBUT, mild Anterior basement membrane dystrophy superiorly arcus; well healed cataract wound, 2-3+ diffuse Punctate epithelial erosions, irregualr epi surface, decreased TBUT   Anterior Chamber Deep and quiet Deep and quiet   Iris Round and dilated Round and dilated   Lens PCIOL; open PC PCIOL; open PC   Vitreous syneresis, Posterior vitreous detachment, vitreous condensations inferiorly syneresis, Posterior vitreous detachment         Fundus Exam       Right Left   Disc Superior hyperemia and mild edema - improved, mild Pallor Pink and Sharp   C/D Ratio 0.6 0.5   Macula Flat, Blunted foveal reflex, trace cystic changes -- improved, +Epiretinal membrane, focal DBH ST mac flat; good foveal reflex, no heme or edema, small pigment clump IT to fovea   Vessels attenuated, Tortuous Vascular attenuation   Periphery Attached; focal dot heme temporal periphery Attached, no heme           IMAGING AND PROCEDURES  Imaging and Procedures for 04/25/17  OCT, Retina - OU - Both Eyes       Right Eye Quality was good. Central Foveal Thickness: 331. Progression has improved. Findings include abnormal foveal contour, epiretinal membrane, no SRF, no IRF (Interval improvement in Trace persistent cystic changes IT fovea--resolved).   Left Eye Quality was good. Central Foveal Thickness: 282. Progression has been stable. Findings include normal foveal contour, no IRF, no SRF (Trace ERM).   Notes *Images captured and stored on drive  Diagnosis / Impression:  OD: BRVO -- Interval improvement/resolution in Trace persistent cystic changes IT fovea OS: NFP; no IRF/SRF--stable, trace ERM  Clinical management:  See  below  Abbreviations: NFP - Normal foveal profile. CME - cystoid macular edema. PED - pigment epithelial detachment. IRF - intraretinal fluid. SRF - subretinal fluid. EZ - ellipsoid zone. ERM - epiretinal membrane. ORA - outer retinal atrophy. ORT - outer retinal tubulation. SRHM - subretinal hyper-reflective material      Intravitreal Injection, Pharmacologic Agent - OD - Right Eye       Time Out 10/21/2020. 2:54 PM. Confirmed correct patient, procedure, site, and patient consented.   Anesthesia Topical anesthesia was used. Anesthetic medications included Lidocaine 2%, Proparacaine 0.5%.   Procedure Preparation included 5% betadine to ocular surface, eyelid speculum. A supplied needle was used.   Injection: 2 mg aflibercept 2 MG/0.05ML   Route: Intravitreal, Site: Right Eye   NDC: [id number], Lot: [id number], Expiration date: 08/03/2021, Waste: 0.05 mL   Post-op Post injection exam found visual acuity of at least counting fingers. The patient tolerated the procedure well. There were no complications. The patient received written and verbal post procedure care education. Post injection medications were not given.             ASSESSMENT/PLAN:    ICD-10-CM   1. 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 Retinal edema  H35.81 OCT, Retina - OU - Both Eyes    3. Both eyes affected by mild nonproliferative diabetic retinopathy with macular edema, associated with type 2 diabetes mellitus ([place])  H96.2229     4. Essential hypertension  I10     5. Hypertensive retinopathy of both eyes  H35.033     6. Epiretinal membrane (ERM) of right eye  H35.371     7. Pseudophakia of both eyes  Z96.1      1,2. BRVO w/ CME OD  - by history, pt states symptoms first noticed 2 wks prior to presentation, but reports changes may have occurred prior  - initial exam  with differential tortuosity of vessels (OD > OS)  - FA (02.10.20) shows mild late staining / leakage in macula, staining / leakage of disc -- improving CME  - differential includes DM2 (DME), hypertensive retinopathy, inflammatory etiology / uveitis  - S/P IVA OD #1 (02.08.19), #2 (03.11.19), #3 (04.09.19), #4 (05.20.19), #5 (02.10.20)  - gave IVA OD on 2.10.20 due to pending Eylea4U for 2020 -- resulted in increased IRF/CME  - review of OCTs show persistent IRF and cystic changes --  resistance to IVA   - June 2019 -- switched therapies: S/P IVE OD #1 (06.24.19), #2 (07.24.19), #3 (09.04.19), #4 (10.30.19),#5 (12.30.19), #6 (03.23.20), #7 (05.05.20), #8 (07.16.20), #9 (07.17.20), #10 (08.28.20), #11 (10.13.20), # 12 (11.17.20), #13 (2.8.21), #14 (03.09.21), #15 (04.13.21), #16 (05.11.21), #17 (06.17.21), #18 (07.23.21), #19 (08.30.21), #20 (10.04.21), #21 (11.08.21), #22 (12.08.21), #23 (01.31.22), #24 (02.28.22), #25 (04.01.22), #26 (06.15.22), #27 (07.13.22), #28 (08.17.22), #29 (09.21.22)  - OCT today shows Interval improvement/resolution in Trace persistent cystic changes IT fovea at 4 weeks  - BCVA 20/30, down from 20/25  - Eylea4U benefits investigation completed and pt approved for IVE for 2022  - recommend IVE OD #30 today, 10.19.22 w/ f/u in 4-5 wks  - RBA of procedure discussed, questions answered  - informed consent obtained  - see procedure note  - Eylea informed consent form obtained and scanned on 11.19.2020  - f/u 4-5 weeks  -- DFE/OCT/possible injection  3. Mild nonproliferative diabetic retinopathy, both eyes  - A1c 6.0 on 10.14.22  - could be contributing to CME OD  - OS with minimal diabetic retinopathy  - continue to monitor  4,5. Hypertensive retinopathy OU - stable  - as above, may have contributing to CME OD  - discussed importance of tight BP control  - monitor  6. Epiretinal membrane, right eye   - stable nasal ERM  - no indication for surgery at this  time  7. Pseudophakia OU  - s/p CE/IOL OU by cataract surgeon in [facility]  - doing well  - monitor  Ophthalmic Meds Ordered this visit:  Meds ordered this encounter  Medications   aflibercept (EYLEA) SOLN 2 mg     Return for 4-5 weeks BRVO OD.  This document serves as a record of services personally performed by Gardiner Sleeper, MD, PhD. It was created on their behalf by Leeann Must, [place], an ophthalmic technician. The creation of this record is the provider's dictation and/or activities during the visit.    Electronically signed by: Leeann Must, COA _0 @ 12:45 PM  Gardiner Sleeper, M.D., Ph.D. Diseases & Surgery of the Retina and Vitreous [facility] 10/21/2020   I have reviewed the above documentation for accuracy and completeness, and I agree with the above. Gardiner Sleeper, M.D., Ph.D. 10/23/20 12:45 PM   Abbreviations: M myopia (nearsighted); A astigmatism; H hyperopia (farsighted); P presbyopia; Mrx spectacle prescription;  CTL contact lenses; OD right eye; OS left eye; OU both eyes  XT exotropia; ET esotropia; PEK punctate epithelial keratitis; PEE punctate epithelial erosions; DES dry eye syndrome; MGD meibomian gland dysfunction; ATs artificial tears; PFAT's preservative free artificial tears; [place] nuclear sclerotic cataract; PSC posterior subcapsular cataract; ERM epi-retinal membrane; PVD posterior vitreous detachment; RD retinal detachment; DM diabetes mellitus; DR diabetic retinopathy; NPDR non-proliferative diabetic retinopathy; PDR proliferative diabetic retinopathy; CSME clinically significant macular edema; DME diabetic macular edema; dbh dot blot hemorrhages; CWS cotton wool spot; POAG primary open angle glaucoma; C/D cup-to-disc ratio; HVF humphrey visual field; GVF goldmann visual field; OCT optical coherence tomography; IOP intraocular pressure; BRVO Branch retinal vein occlusion; CRVO central retinal vein occlusion; CRAO central retinal artery  occlusion; BRAO branch retinal artery occlusion; RT retinal tear; SB scleral buckle; PPV pars plana vitrectomy; VH Vitreous hemorrhage; PRP panretinal laser photocoagulation; IVK intravitreal kenalog; VMT vitreomacular traction; MH Macular hole;  NVD neovascularization of the disc; NVE neovascularization elsewhere; AREDS age related eye disease study; ARMD age related macular degeneration; POAG primary open angle glaucoma; EBMD epithelial/anterior basement membrane dystrophy; ACIOL anterior chamber intraocular lens; IOL intraocular lens; PCIOL posterior chamber intraocular lens; Phaco/IOL phacoemulsification with intraocular lens placement; [place] photorefractive keratectomy; LASIK laser assisted in situ keratomileusis; HTN hypertension; DM diabetes mellitus; COPD chronic obstructive pulmonary disease

## 2020-10-21 ENCOUNTER — Ambulatory Visit (INDEPENDENT_AMBULATORY_CARE_PROVIDER_SITE_OTHER): Payer: Medicare Other | Admitting: Ophthalmology

## 2020-10-21 ENCOUNTER — Other Ambulatory Visit: Payer: Self-pay

## 2020-10-21 ENCOUNTER — Encounter (INDEPENDENT_AMBULATORY_CARE_PROVIDER_SITE_OTHER): Payer: Self-pay | Admitting: Ophthalmology

## 2020-10-21 DIAGNOSIS — H35033 Hypertensive retinopathy, bilateral: Secondary | ICD-10-CM | POA: Diagnosis not present

## 2020-10-21 DIAGNOSIS — H35371 Puckering of macula, right eye: Secondary | ICD-10-CM

## 2020-10-21 DIAGNOSIS — I10 Essential (primary) hypertension: Secondary | ICD-10-CM

## 2020-10-21 DIAGNOSIS — Z961 Presence of intraocular lens: Secondary | ICD-10-CM

## 2020-10-21 DIAGNOSIS — H3581 Retinal edema: Secondary | ICD-10-CM

## 2020-10-21 DIAGNOSIS — H34831 Tributary (branch) retinal vein occlusion, right eye, with macular edema: Secondary | ICD-10-CM

## 2020-10-21 DIAGNOSIS — E113213 Type 2 diabetes mellitus with mild nonproliferative diabetic retinopathy with macular edema, bilateral: Secondary | ICD-10-CM

## 2020-10-21 DIAGNOSIS — H4710 Unspecified papilledema: Secondary | ICD-10-CM

## 2020-10-21 DIAGNOSIS — H43391 Other vitreous opacities, right eye: Secondary | ICD-10-CM

## 2020-10-23 ENCOUNTER — Encounter (INDEPENDENT_AMBULATORY_CARE_PROVIDER_SITE_OTHER): Payer: Self-pay | Admitting: Ophthalmology

## 2020-10-23 MED ORDER — AFLIBERCEPT 2MG/0.05ML IZ SOLN FOR KALEIDOSCOPE
2.0000 mg | INTRAVITREAL | Status: AC | PRN
  Administered 2020-10-21: 2 mg via INTRAVITREAL

## 2020-11-02 NOTE — Addendum Note (Signed)
Addended by: Riki Altes on: 11/02/2020 07:49 AM   Modules accepted: Orders

## 2020-11-06 ENCOUNTER — Encounter: Payer: Self-pay | Admitting: Internal Medicine

## 2020-11-13 ENCOUNTER — Inpatient Hospital Stay: Payer: Medicare Other

## 2020-11-13 ENCOUNTER — Telehealth: Payer: Self-pay | Admitting: Internal Medicine

## 2020-11-13 NOTE — Telephone Encounter (Signed)
Pt called to cancel appt for today due to the weather

## 2020-11-17 NOTE — Progress Notes (Signed)
[facility] Note  11/18/2020     CHIEF COMPLAINT Patient presents for Retina Follow Up   HISTORY OF PRESENT ILLNESS: Cheryl Leonard is a 75 y.o. female who presents to the clinic today for:   HPI     Retina Follow Up   Patient presents with  CRVO/BRVO.  In right eye.  This started 4 weeks ago.  I, the attending physician,  performed the HPI with the patient and updated documentation appropriately.        Comments   Patient here for 4 weeks retina follow up for BRVO OD. Patient states vision doing the same. No eye pain.       Last edited by Bernarda Caffey, MD on 11/18/2020  3:01 PM.      Referring physician: [facility] [address],  [place] [address]  HISTORICAL INFORMATION:   Selected notes from the MEDICAL RECORD NUMBER Referred by Dr. Marvel Plan for concern of DME OD Lab Results  Component Value Date   HGBA1C 6.0 (H) 10/16/2020       CURRENT MEDICATIONS: No current outpatient medications on file. (Ophthalmic Drugs)   No current facility-administered medications for this visit. (Ophthalmic Drugs)   Current Outpatient Medications (Other)  Medication Sig   ALPRAZolam (XANAX) 0.25 MG tablet Take 0.25 mg by mouth daily as needed for anxiety or sleep.    amLODipine (NORVASC) 5 MG tablet Take 5 mg by mouth daily.   aspirin EC 81 MG tablet Take 81 mg by mouth daily.    cholecalciferol (VITAMIN D) 1000 units tablet Take 1,000 Units by mouth 2 (two) times daily.   CORAL CALCIUM PO Take 1 tablet by mouth 2 (two) times daily.    denosumab (PROLIA) 60 MG/ML SOLN injection Inject 60 mg into the skin every 6 (six) months.    escitalopram (LEXAPRO) 10 MG tablet Take 10 mg by mouth daily.   estradiol (ESTRACE) 0.1 MG/GM vaginal cream Apply one pea-sized amount around the opening of the urethra daily for two weeks, then three times weekly thereafter.   famotidine (PEPCID) 40 MG tablet Take 40 mg by mouth daily.    furosemide (LASIX) 20 MG tablet Take 20 mg by mouth daily as needed for fluid or edema.    levothyroxine (SYNTHROID, LEVOTHROID) 100 MCG tablet Take 100 mcg by mouth daily before breakfast.    lidocaine-prilocaine (EMLA) cream Apply 1 application topically as needed (apply prior to port a cath access).   metFORMIN (GLUCOPHAGE) 1000 MG tablet Take 1 tablet by mouth 2 (two) times daily with a meal.   Multiple Vitamin (MULTIVITAMIN WITH MINERALS) TABS tablet Take 1 tablet by mouth daily.   nystatin cream (MYCOSTATIN) Apply 1 application topically 2 (two) times daily.   olmesartan (BENICAR) 20 MG tablet Take 20 mg by mouth daily.   pantoprazole (PROTONIX) 40 MG tablet Take 40 mg by mouth every morning.    rivaroxaban (XARELTO) 20 MG TABS tablet Take 1 tablet (20 mg total) by mouth daily with supper.   rosuvastatin (CRESTOR) 20 MG tablet Take 20 mg by mouth every morning.   sulfamethoxazole-trimethoprim (BACTRIM) 400-80 MG tablet Take 1 tablet by mouth daily.   TRESIBA FLEXTOUCH 200 UNIT/ML SOPN Inject 30 Units as directed at bedtime.    vitamin B-12 (CYANOCOBALAMIN) 100 MCG tablet Take 100 mcg by mouth daily.   vitamin E 400 UNIT capsule Take 400 Units by mouth daily.   zolpidem (AMBIEN) 10 MG tablet Take 10 mg by  mouth at bedtime as needed for sleep.   Magnesium 500 MG TABS Take 500 mg by mouth every morning.    metoprolol succinate (TOPROL-XL) 50 MG 24 hr tablet Take 50 mg by mouth daily.   mirtazapine (REMERON) 15 MG tablet Take 15 mg by mouth at bedtime.   No current facility-administered medications for this visit. (Other)   REVIEW OF SYSTEMS: ROS   Positive for: Neurological, Genitourinary, Musculoskeletal, Endocrine, Eyes Negative for: Constitutional, Gastrointestinal, Skin, HENT, Cardiovascular, Respiratory, Psychiatric, Allergic/Imm, Heme/Lymph Last edited by Theodore Demark, COA on 11/18/2020  2:58 PM.     ALLERGIES Allergies  Allergen Reactions   Ace Inhibitors Other (See  Comments)   PAST MEDICAL HISTORY Past Medical History:  Diagnosis Date   Anemia    Anxiety    Arthritis    Gout   Cataracts, both eyes    Diabetic retinopathy ([place])    NPDR OU   Diabetic retinopathy ([place])    GERD (gastroesophageal reflux disease)    Gout    Headache    h/o migraines   History of fracture of patella    right knee   History of positive PPD    Patient always shows positive   Hyperlipidemia    Hypertension    Hypertensive retinopathy    OU   Hypothyroidism    Lichen sclerosus 44/01/270   of vulva   Metatarsal fracture    Neuropathy    Peripheral vascular disease (HCC)    Polyneuropathy    numbness and tingling in feet and toes   Renal insufficiency    Stage 3   Sleep apnea    does not use cpap-lost weight    Type 2 diabetes mellitus, uncontrolled    Past Surgical History:  Procedure Laterality Date   ABDOMINAL HYSTERECTOMY     AMPUTATION TOE Right 05/08/2020   Procedure: AMPUTATION TOE-Right 4th Toe;  Surgeon: Caroline More, DPM;  Location: [facility];  Service: Podiatry;  Laterality: Right;   APPENDECTOMY     BREAST REDUCTION SURGERY  2001   CATARACT EXTRACTION     CESAREAN SECTION  1976   COLONOSCOPY  03/05/2013   Nml - due for repeat 03/06/2018   COLONOSCOPY WITH PROPOFOL N/A 03/18/2019   Procedure: COLONOSCOPY WITH PROPOFOL;  Surgeon: Toledo, Benay Pike, MD;  Location: [facility];  Service: Gastroenterology;  Laterality: N/A;   DIAGNOSTIC LAPAROSCOPY     DILATION AND CURETTAGE OF UTERUS  1989   ENDARTERECTOMY FEMORAL Bilateral 03/09/2018   Procedure: ENDARTERECTOMY FEMORAL;  Surgeon: Algernon Huxley, MD;  Location: [facility];  Service: Vascular;  Laterality: Bilateral;   ENDARTERECTOMY POPLITEAL Left 03/09/2018   Procedure: ENDARTERECTOMY POPLITEAL AND SFA;  Surgeon: Algernon Huxley, MD;  Location: [facility];  Service: Vascular;  Laterality: Left;   ESOPHAGOGASTRODUODENOSCOPY  03/05/2013   ESOPHAGOGASTRODUODENOSCOPY (EGD) WITH PROPOFOL N/A 03/18/2019    Procedure: ESOPHAGOGASTRODUODENOSCOPY (EGD) WITH PROPOFOL;  Surgeon: Toledo, Benay Pike, MD;  Location: [facility];  Service: Gastroenterology;  Laterality: N/A;   EYE SURGERY     Eyelid Surgery  2012   INTRAMEDULLARY (IM) NAIL INTERTROCHANTERIC Left 10/30/2015   Procedure: INTRAMEDULLARY (IM) NAIL INTERTROCHANTRIC ;  Surgeon: Hessie Knows, MD;  Location: [facility];  Service: Orthopedics;  Laterality: Left;   KYPHOPLASTY N/A 10/25/2018   Procedure: L4 KYPHOPLASTY;  Surgeon: Hessie Knows, MD;  Location: [facility];  Service: Orthopedics;  Laterality: N/A;   LAPAROSCOPIC HYSTERECTOMY  2000   total   LOWER EXTREMITY ANGIOGRAPHY Left 03/08/2017   Procedure:  LOWER EXTREMITY ANGIOGRAPHY;  Surgeon: Algernon Huxley, MD;  Location: [place] [facility];  Service: Cardiovascular;  Laterality: Left;   LOWER EXTREMITY ANGIOGRAPHY Left 10/30/2017   Procedure: LOWER EXTREMITY ANGIOGRAPHY;  Surgeon: Algernon Huxley, MD;  Location: [facility];  Service: Cardiovascular;  Laterality: Left;   LOWER EXTREMITY ANGIOGRAPHY Right 03/08/2018   Procedure: LOWER EXTREMITY ANGIOGRAPHY;  Surgeon: Algernon Huxley, MD;  Location: [place];  Service: Cardiovascular;  Laterality: Right;   LOWER EXTREMITY ANGIOGRAPHY Left 10/01/2018   Procedure: LOWER EXTREMITY ANGIOGRAPHY;  Surgeon: Algernon Huxley, MD;  Location: [place] [facility];  Service: Cardiovascular;  Laterality: Left;   LOWER EXTREMITY ANGIOGRAPHY Right 10/08/2018   Procedure: LOWER EXTREMITY ANGIOGRAPHY;  Surgeon: Algernon Huxley, MD;  Location: [place] [facility];  Service: Cardiovascular;  Laterality: Right;   LOWER EXTREMITY ANGIOGRAPHY Right 05/07/2020   Procedure: Lower Extremity Angiography;  Surgeon: Algernon Huxley, MD;  Location: [place] [facility];  Service: Cardiovascular;  Laterality: Right;   PERIPHERAL VASCULAR INTERVENTION  03/08/2018   Procedure: PERIPHERAL VASCULAR INTERVENTION;  Surgeon: Algernon Huxley, MD;  Location: [place] [facility];   Service: Cardiovascular;;   PORTA CATH INSERTION N/A 02/17/2020   Procedure: PORTA CATH INSERTION;  Surgeon: Algernon Huxley, MD;  Location: [facility] [facility];  Service: Cardiovascular;  Laterality: N/A;   REDUCTION MAMMAPLASTY  1997   SACROPLASTY N/A 10/25/2018   Procedure: S1 SACROPLASTY;  Surgeon: Hessie Knows, MD;  Location: [facility];  Service: Orthopedics;  Laterality: N/A;   FAMILY HISTORY Family History  Problem Relation Age of Onset   Coronary artery disease Father    Heart attack Father    Coronary artery disease Mother    Heart attack Mother    Ovarian cancer Sister 43       sister had hormonal therapy for IVF txs-which increased risk factor for ovarian cancer   Breast cancer Neg Hx    SOCIAL HISTORY Social History   Tobacco Use   Smoking status: Former    Packs/day: 1.00    Years: 20.00    Pack years: 20.00    Types: Cigarettes    Quit date: 03/07/1996    Years since quitting: 24.7   Smokeless tobacco: Never   Tobacco comments:    started smoking at age 89  Vaping Use   Vaping Use: Never used  Substance Use Topics   Alcohol use: No    Alcohol/week: 0.0 standard drinks   Drug use: No       OPHTHALMIC EXAM: Base Eye Exam     Visual Acuity (Snellen - Linear)       Right Left   Dist [place] 20/30 -1 20/20   Dist ph [place] NI          Tonometry (Tonopen, 2:56 PM)       Right Left   Pressure 20 17         Pupils       Dark Light Shape React APD   Right 3 2 Round Minimal None   Left 3 2 Round Minimal None         Visual Fields (Counting fingers)       Left Right    Full Full         Extraocular Movement       Right Left    Full, Ortho Full, Ortho         Neuro/Psych     Oriented  x3: Yes   Mood/Affect: Normal         Dilation     Both eyes: 1.0% Mydriacyl, 2.5% Phenylephrine @ 2:56 PM           Slit Lamp and Fundus Exam     External Exam       Right Left   External Normal Normal         Slit Lamp Exam        Right Left   Lids/Lashes dermatochalasis dermatochalasis   Conjunctiva/Sclera White and quiet White and quiet   Cornea arcus; well healed cataract wound; 2-3+ diffuse Punctate epithelial erosions, decreased TBUT, mild Anterior basement membrane dystrophy superiorly arcus; well healed cataract wound, 2-3+ diffuse Punctate epithelial erosions, irregualr epi surface, decreased TBUT   Anterior Chamber Deep and quiet Deep and quiet   Iris Round and dilated Round and dilated   Lens PCIOL; open PC PCIOL; open PC   Vitreous syneresis, Posterior vitreous detachment, vitreous condensations inferiorly syneresis, Posterior vitreous detachment         Fundus Exam       Right Left   Disc Superior hyperemia and mild edema - improved, mild Pallor Pink and Sharp   C/D Ratio 0.6 0.5   Macula Flat, Blunted foveal reflex, trace cystic changes -- stably improved, +Epiretinal membrane, focal DBH ST mac - improved flat; good foveal reflex, no heme or edema, small pigment clump IT to fovea   Vessels attenuated, Tortuous Vascular attenuation   Periphery Attached; focal dot heme temporal periphery Attached, no heme           IMAGING AND PROCEDURES  Imaging and Procedures for 04/25/17  OCT, Retina - OU - Both Eyes       Right Eye Quality was good. Central Foveal Thickness: 322. Progression has been stable. Findings include abnormal foveal contour, epiretinal membrane, no SRF, no IRF (stable improvement in IRF).   Left Eye Quality was good. Central Foveal Thickness: 281. Progression has been stable. Findings include normal foveal contour, no IRF, no SRF (Trace ERM).   Notes *Images captured and stored on drive  Diagnosis / Impression:  OD: BRVO -- stable improvement in IRF OS: NFP; no IRF/SRF--stable, trace ERM  Clinical management:  See below  Abbreviations: NFP - Normal foveal profile. CME - cystoid macular edema. PED - pigment epithelial detachment. IRF - intraretinal fluid. SRF - subretinal  fluid. EZ - ellipsoid zone. ERM - epiretinal membrane. ORA - outer retinal atrophy. ORT - outer retinal tubulation. SRHM - subretinal hyper-reflective material      Intravitreal Injection, Pharmacologic Agent - OD - Right Eye       Time Out 11/18/2020. 3:40 PM. Confirmed correct patient, procedure, site, and patient consented.   Anesthesia Topical anesthesia was used. Anesthetic medications included Lidocaine 2%, Proparacaine 0.5%.   Procedure Preparation included 5% betadine to ocular surface, eyelid speculum.   Injection: 2 mg aflibercept 2 MG/0.05ML   Route: Intravitreal, Site: Right Eye   NDC: [id number], Lot: [id number], Expiration date: 10/02/2021, Waste: 0 mL   Post-op Post injection exam found visual acuity of at least counting fingers. The patient tolerated the procedure well. There were no complications. The patient received written and verbal post procedure care education. Post injection medications were not given.            ASSESSMENT/PLAN:    ICD-10-CM   1. Branch retinal vein occlusion of right eye with macular edema  H34.8310 Intravitreal Injection, Pharmacologic Agent - OD -  Right Eye    aflibercept (EYLEA) SOLN 2 mg    2. Retinal edema  H35.81 OCT, Retina - OU - Both Eyes    3. Both eyes affected by mild nonproliferative diabetic retinopathy with macular edema, associated with type 2 diabetes mellitus ([place])  E95.2841     4. Essential hypertension  I10     5. Hypertensive retinopathy of both eyes  H35.033     6. Epiretinal membrane (ERM) of right eye  H35.371     7. Pseudophakia of both eyes  Z96.1       1,2. BRVO w/ CME OD  - by history, pt states symptoms first noticed 2 wks prior to presentation, but reports changes may have occurred prior  - initial exam with differential tortuosity of vessels (OD > OS)  - FA (02.10.20) shows mild late staining / leakage in macula, staining / leakage of disc -- improving CME  - differential includes DM2  (DME), hypertensive retinopathy, inflammatory etiology / uveitis  - S/P IVA OD #1 (02.08.19), #2 (03.11.19), #3 (04.09.19), #4 (05.20.19), #5 (02.10.20)  - gave IVA OD on 2.10.20 due to pending Eylea4U for 2020 -- resulted in increased IRF/CME  - review of OCTs show persistent IRF and cystic changes --  resistance to IVA   - June 2019 -- switched therapies: S/P IVE OD #1 (06.24.19), #2 (07.24.19), #3 (09.04.19), #4 (10.30.19),#5 (12.30.19), #6 (03.23.20), #7 (05.05.20), #8 (07.16.20), #9 (07.17.20), #10 (08.28.20), #11 (10.13.20), # 12 (11.17.20), #13 (2.8.21), #14 (03.09.21), #15 (04.13.21), #16 (05.11.21), #17 (06.17.21), #18 (07.23.21), #19 (08.30.21), #20 (10.04.21), #21 (11.08.21), #22 (12.08.21), #23 (01.31.22), #24 (02.28.22), #25 (04.01.22), #26 (06.15.22), #27 (07.13.22), #28 (08.17.22), #29 (09.21.22), #30 (10.19.22)  - OCT today shows stable improvement in IRF/cystic changes at 4 weeks  - BCVA 20/30 -- stable  - Eylea4U benefits investigation completed and pt approved for IVE for 2022  - recommend IVE OD #31 today, 11.16.22 w/ f/u in 4 wks  - RBA of procedure discussed, questions answered  - informed consent obtained  - see procedure note  - Eylea informed consent form obtained and scanned on 11.19.2020  - f/u 4 weeks  -- DFE/OCT/possible injection  3. Mild nonproliferative diabetic retinopathy, both eyes  - A1c 6.0 on 10.14.22  - could be contributing to CME OD  - OS with minimal diabetic retinopathy  - continue to monitor  4,5. Hypertensive retinopathy OU - stable  - as above, may have contributing to CME OD  - discussed importance of tight BP control  - monitor  6. Epiretinal membrane, right eye   - stable nasal ERM  - no indication for surgery at this time  7. Pseudophakia OU  - s/p CE/IOL OU by cataract surgeon in [facility]  - doing well  - monitor  Ophthalmic Meds Ordered this visit:  Meds ordered this encounter  Medications   aflibercept (EYLEA) SOLN 2 mg       Return in about 4 weeks (around 12/16/2020) for f/u BRVO OD, DFE, OCT.  This document serves as a record of services personally performed by Gardiner Sleeper, MD, PhD. It was created on their behalf by Orvan Falconer, an ophthalmic technician. The creation of this record is the provider's dictation and/or activities during the visit.    Electronically signed by: Orvan Falconer, OA, 11/18/20  5:13 PM   Gardiner Sleeper, M.D., Ph.D. Diseases & Surgery of the Retina and Vitreous [facility]  I have reviewed the above  documentation for accuracy and completeness, and I agree with the above. Gardiner Sleeper, M.D., Ph.D. 11/18/20 5:14 PM   Abbreviations: M myopia (nearsighted); A astigmatism; H hyperopia (farsighted); P presbyopia; Mrx spectacle prescription;  CTL contact lenses; OD right eye; OS left eye; OU both eyes  XT exotropia; ET esotropia; PEK punctate epithelial keratitis; PEE punctate epithelial erosions; DES dry eye syndrome; MGD meibomian gland dysfunction; ATs artificial tears; PFAT's preservative free artificial tears; [place] nuclear sclerotic cataract; PSC posterior subcapsular cataract; ERM epi-retinal membrane; PVD posterior vitreous detachment; RD retinal detachment; DM diabetes mellitus; DR diabetic retinopathy; NPDR non-proliferative diabetic retinopathy; PDR proliferative diabetic retinopathy; CSME clinically significant macular edema; DME diabetic macular edema; dbh dot blot hemorrhages; CWS cotton wool spot; POAG primary open angle glaucoma; C/D cup-to-disc ratio; HVF humphrey visual field; GVF goldmann visual field; OCT optical coherence tomography; IOP intraocular pressure; BRVO Branch retinal vein occlusion; CRVO central retinal vein occlusion; CRAO central retinal artery occlusion; BRAO branch retinal artery occlusion; RT retinal tear; SB scleral buckle; PPV pars plana vitrectomy; VH Vitreous hemorrhage; PRP panretinal laser photocoagulation; IVK  intravitreal kenalog; VMT vitreomacular traction; MH Macular hole;  NVD neovascularization of the disc; NVE neovascularization elsewhere; AREDS age related eye disease study; ARMD age related macular degeneration; POAG primary open angle glaucoma; EBMD epithelial/anterior basement membrane dystrophy; ACIOL anterior chamber intraocular lens; IOL intraocular lens; PCIOL posterior chamber intraocular lens; Phaco/IOL phacoemulsification with intraocular lens placement; [place] photorefractive keratectomy; LASIK laser assisted in situ keratomileusis; HTN hypertension; DM diabetes mellitus; COPD chronic obstructive pulmonary disease

## 2020-11-18 ENCOUNTER — Other Ambulatory Visit: Payer: Self-pay

## 2020-11-18 ENCOUNTER — Encounter (INDEPENDENT_AMBULATORY_CARE_PROVIDER_SITE_OTHER): Payer: Self-pay | Admitting: Ophthalmology

## 2020-11-18 ENCOUNTER — Ambulatory Visit (INDEPENDENT_AMBULATORY_CARE_PROVIDER_SITE_OTHER): Payer: Medicare Other | Admitting: Ophthalmology

## 2020-11-18 DIAGNOSIS — H35033 Hypertensive retinopathy, bilateral: Secondary | ICD-10-CM | POA: Diagnosis not present

## 2020-11-18 DIAGNOSIS — H43391 Other vitreous opacities, right eye: Secondary | ICD-10-CM

## 2020-11-18 DIAGNOSIS — Z961 Presence of intraocular lens: Secondary | ICD-10-CM

## 2020-11-18 DIAGNOSIS — I10 Essential (primary) hypertension: Secondary | ICD-10-CM

## 2020-11-18 DIAGNOSIS — H3581 Retinal edema: Secondary | ICD-10-CM

## 2020-11-18 DIAGNOSIS — E113213 Type 2 diabetes mellitus with mild nonproliferative diabetic retinopathy with macular edema, bilateral: Secondary | ICD-10-CM | POA: Diagnosis not present

## 2020-11-18 DIAGNOSIS — H34831 Tributary (branch) retinal vein occlusion, right eye, with macular edema: Secondary | ICD-10-CM | POA: Diagnosis not present

## 2020-11-18 DIAGNOSIS — H4710 Unspecified papilledema: Secondary | ICD-10-CM

## 2020-11-18 DIAGNOSIS — H35371 Puckering of macula, right eye: Secondary | ICD-10-CM

## 2020-11-18 MED ORDER — AFLIBERCEPT 2MG/0.05ML IZ SOLN FOR KALEIDOSCOPE
2.0000 mg | INTRAVITREAL | Status: AC | PRN
  Administered 2020-11-18: 2 mg via INTRAVITREAL

## 2020-11-19 ENCOUNTER — Inpatient Hospital Stay: Payer: Medicare Other | Attending: Internal Medicine

## 2020-12-02 ENCOUNTER — Ambulatory Visit
Admission: RE | Admit: 2020-12-02 | Discharge: 2020-12-02 | Disposition: A | Discharge status: Home / Self Care | Payer: Medicare Other | Source: Ambulatory Visit | Attending: Urology | Admitting: Urology

## 2020-12-02 ENCOUNTER — Other Ambulatory Visit: Payer: Self-pay

## 2020-12-02 DIAGNOSIS — N321 Vesicointestinal fistula: Secondary | ICD-10-CM | POA: Diagnosis present

## 2020-12-02 DIAGNOSIS — R3989 Other symptoms and signs involving the genitourinary system: Secondary | ICD-10-CM | POA: Insufficient documentation

## 2020-12-02 DIAGNOSIS — N390 Urinary tract infection, site not specified: Secondary | ICD-10-CM | POA: Insufficient documentation

## 2020-12-02 DIAGNOSIS — R948 Abnormal results of function studies of other organs and systems: Secondary | ICD-10-CM | POA: Diagnosis present

## 2020-12-10 ENCOUNTER — Other Ambulatory Visit: Payer: Self-pay

## 2020-12-10 ENCOUNTER — Ambulatory Visit (INDEPENDENT_AMBULATORY_CARE_PROVIDER_SITE_OTHER): Payer: Medicare Other | Admitting: Urology

## 2020-12-10 ENCOUNTER — Encounter: Payer: Self-pay | Admitting: Urology

## 2020-12-10 VITALS — BP 119/71 | HR 85 | Ht 68.0 in | Wt 160.0 lb

## 2020-12-10 DIAGNOSIS — N390 Urinary tract infection, site not specified: Secondary | ICD-10-CM | POA: Diagnosis not present

## 2020-12-10 DIAGNOSIS — N321 Vesicointestinal fistula: Secondary | ICD-10-CM

## 2020-12-10 LAB — URINALYSIS, COMPLETE
Bilirubin, UA: NEGATIVE
Glucose, UA: NEGATIVE
Leukocytes,UA: NEGATIVE
Nitrite, UA: POSITIVE — AB
RBC, UA: NEGATIVE
Specific Gravity, UA: 1.015 (ref 1.005–1.030)
Urobilinogen, Ur: 0.2 mg/dL (ref 0.2–1.0)
pH, UA: 5.5 (ref 5.0–7.5)

## 2020-12-10 LAB — MICROSCOPIC EXAMINATION: RBC, Urine: NONE SEEN /hpf (ref 0–2)

## 2020-12-10 MED ORDER — SULFAMETHOXAZOLE-TRIMETHOPRIM 800-160 MG PO TABS: 1.0000 | ORAL_TABLET | Freq: Two times a day (BID) | ORAL | 0 refills | Status: DC

## 2020-12-10 NOTE — Progress Notes (Signed)
12/10/2020 10:38 AM   Cheryl Leonard Nov 30, 1945 [id number]  Referring provider: Rusty Aus, MD [place] [facility] [place],  [place] [address]  Chief Complaint  Patient presents with   Other    HPI: 75 y.o. female presents for follow-up visit.  History recurrent UTI and overactive bladder CT March 2022 with foci of gas in bladder and thickened sigmoid colon Cystoscopy 09/2020 with with inflamed bladder mucosa with bullous edema anterior bladder wall Follow-up CT 12/02/2020 with persistent gas in bladder Presently with complaints of increased frequency, urgency and dysuria Colonoscopy 03/2019 with a few, small-mouth diverticula in the sigmoid colon   PMH: Past Medical History:  Diagnosis Date   Anemia    Anxiety    Arthritis    Gout   Cataracts, both eyes    Diabetic retinopathy ([place])    NPDR OU   Diabetic retinopathy ([place])    GERD (gastroesophageal reflux disease)    Gout    Headache    h/o migraines   History of fracture of patella    right knee   History of positive PPD    Patient always shows positive   Hyperlipidemia    Hypertension    Hypertensive retinopathy    OU   Hypothyroidism    Lichen sclerosus [id number]   of vulva   Metatarsal fracture    Neuropathy    Peripheral vascular disease (HCC)    Polyneuropathy    numbness and tingling in feet and toes   Renal insufficiency    Stage 3   Sleep apnea    does not use cpap-lost weight    Type 2 diabetes mellitus, uncontrolled     Surgical History: Past Surgical History:  Procedure Laterality Date   ABDOMINAL HYSTERECTOMY     AMPUTATION TOE Right 05/08/2020   Procedure: AMPUTATION TOE-Right 4th Toe;  Surgeon: Caroline More, DPM;  Location: [facility];  Service: Podiatry;  Laterality: Right;   APPENDECTOMY     BREAST REDUCTION SURGERY  2001   CATARACT EXTRACTION     CESAREAN SECTION  1976   COLONOSCOPY  03/05/2013   Nml - due for repeat 03/06/2018    COLONOSCOPY WITH PROPOFOL N/A 03/18/2019   Procedure: COLONOSCOPY WITH PROPOFOL;  Surgeon: Toledo, Benay Pike, MD;  Location: [facility];  Service: Gastroenterology;  Laterality: N/A;   DIAGNOSTIC LAPAROSCOPY     DILATION AND CURETTAGE OF UTERUS  1989   ENDARTERECTOMY FEMORAL Bilateral 03/09/2018   Procedure: ENDARTERECTOMY FEMORAL;  Surgeon: Algernon Huxley, MD;  Location: [facility];  Service: Vascular;  Laterality: Bilateral;   ENDARTERECTOMY POPLITEAL Left 03/09/2018   Procedure: ENDARTERECTOMY POPLITEAL AND SFA;  Surgeon: Algernon Huxley, MD;  Location: [facility];  Service: Vascular;  Laterality: Left;   ESOPHAGOGASTRODUODENOSCOPY  03/05/2013   ESOPHAGOGASTRODUODENOSCOPY (EGD) WITH PROPOFOL N/A 03/18/2019   Procedure: ESOPHAGOGASTRODUODENOSCOPY (EGD) WITH PROPOFOL;  Surgeon: Toledo, Benay Pike, MD;  Location: [facility];  Service: Gastroenterology;  Laterality: N/A;   EYE SURGERY     Eyelid Surgery  2012   INTRAMEDULLARY (IM) NAIL INTERTROCHANTERIC Left 10/30/2015   Procedure: INTRAMEDULLARY (IM) NAIL INTERTROCHANTRIC ;  Surgeon: Hessie Knows, MD;  Location: [facility];  Service: Orthopedics;  Laterality: Left;   KYPHOPLASTY N/A 10/25/2018   Procedure: L4 KYPHOPLASTY;  Surgeon: Hessie Knows, MD;  Location: [facility];  Service: Orthopedics;  Laterality: N/A;   LAPAROSCOPIC HYSTERECTOMY  2000   total   LOWER EXTREMITY ANGIOGRAPHY Left 03/08/2017   Procedure: LOWER EXTREMITY ANGIOGRAPHY;  Surgeon: Annice Needy, MD;  Location: [facility];  Service: Cardiovascular;  Laterality: Left;   LOWER EXTREMITY ANGIOGRAPHY Left 10/30/2017   Procedure: LOWER EXTREMITY ANGIOGRAPHY;  Surgeon: Annice Needy, MD;  Location: [facility];  Service: Cardiovascular;  Laterality: Left;   LOWER EXTREMITY ANGIOGRAPHY Right 03/08/2018   Procedure: LOWER EXTREMITY ANGIOGRAPHY;  Surgeon: Annice Needy, MD;  Location: [facility];  Service: Cardiovascular;  Laterality: Right;   LOWER EXTREMITY ANGIOGRAPHY  Left 10/01/2018   Procedure: LOWER EXTREMITY ANGIOGRAPHY;  Surgeon: Annice Needy, MD;  Location: [facility];  Service: Cardiovascular;  Laterality: Left;   LOWER EXTREMITY ANGIOGRAPHY Right 10/08/2018   Procedure: LOWER EXTREMITY ANGIOGRAPHY;  Surgeon: Annice Needy, MD;  Location: [facility];  Service: Cardiovascular;  Laterality: Right;   LOWER EXTREMITY ANGIOGRAPHY Right 05/07/2020   Procedure: Lower Extremity Angiography;  Surgeon: Annice Needy, MD;  Location: [facility];  Service: Cardiovascular;  Laterality: Right;   PERIPHERAL VASCULAR INTERVENTION  03/08/2018   Procedure: PERIPHERAL VASCULAR INTERVENTION;  Surgeon: Annice Needy, MD;  Location: [facility];  Service: Cardiovascular;;   PORTA CATH INSERTION N/A 02/17/2020   Procedure: PORTA CATH INSERTION;  Surgeon: Annice Needy, MD;  Location: [facility];  Service: Cardiovascular;  Laterality: N/A;   REDUCTION MAMMAPLASTY  1997   SACROPLASTY N/A 10/25/2018   Procedure: S1 SACROPLASTY;  Surgeon: Kennedy Bucker, MD;  Location: [facility];  Service: Orthopedics;  Laterality: N/A;    Home Medications:  Allergies as of 12/10/2020       Reactions   Ace Inhibitors Other (See Comments)        Medication List        Accurate as of December 10, 2020 10:38 AM. If you have any questions, ask your nurse or doctor.          ALPRAZolam 0.25 MG tablet Commonly known as: XANAX Take 0.25 mg by mouth daily as needed for anxiety or sleep.   amLODipine 5 MG tablet Commonly known as: NORVASC Take 5 mg by mouth daily.   aspirin EC 81 MG tablet Take 81 mg by mouth daily.   cholecalciferol 1000 units tablet Commonly known as: VITAMIN D Take 1,000 Units by mouth 2 (two) times daily.   CORAL CALCIUM PO Take 1 tablet by mouth 2 (two) times daily.   denosumab 60 MG/ML Soln injection Commonly known as: PROLIA Inject 60 mg into the skin every 6 (six) months.   escitalopram 10 MG tablet Commonly  known as: LEXAPRO Take 10 mg by mouth daily.   estradiol 0.1 MG/GM vaginal cream Commonly known as: ESTRACE Apply one pea-sized amount around the opening of the urethra daily for two weeks, then three times weekly thereafter.   famotidine 40 MG tablet Commonly known as: PEPCID Take 40 mg by mouth daily.   furosemide 20 MG tablet Commonly known as: LASIX Take 20 mg by mouth daily as needed for fluid or edema.   levothyroxine 100 MCG tablet Commonly known as: SYNTHROID Take 100 mcg by mouth daily before breakfast.   lidocaine-prilocaine cream Commonly known as: EMLA Apply 1 application topically as needed (apply prior to port a cath access).   Magnesium 500 MG Tabs Take 500 mg by mouth every morning.   metFORMIN 1000 MG tablet Commonly known as: GLUCOPHAGE Take 1 tablet by mouth 2 (two) times daily with a meal.   metoprolol succinate 50 MG 24  hr tablet Commonly known as: TOPROL-XL Take 50 mg by mouth daily.   mirtazapine 15 MG tablet Commonly known as: REMERON Take 15 mg by mouth at bedtime.   multivitamin with minerals Tabs tablet Take 1 tablet by mouth daily.   nystatin cream Commonly known as: MYCOSTATIN Apply 1 application topically 2 (two) times daily.   olmesartan 20 MG tablet Commonly known as: BENICAR Take 20 mg by mouth daily.   pantoprazole 40 MG tablet Commonly known as: PROTONIX Take 40 mg by mouth every morning.   rivaroxaban 20 MG Tabs tablet Commonly known as: XARELTO Take 1 tablet (20 mg total) by mouth daily with supper.   rosuvastatin 20 MG tablet Commonly known as: CRESTOR Take 20 mg by mouth every morning.   sulfamethoxazole-trimethoprim 400-80 MG tablet Commonly known as: BACTRIM Take 1 tablet by mouth daily.   Tyler Aas FlexTouch 200 UNIT/ML FlexTouch Pen Generic drug: insulin degludec Inject 30 Units as directed at bedtime.   vitamin B-12 100 MCG tablet Commonly known as: CYANOCOBALAMIN Take 100 mcg by mouth daily.   vitamin  E 180 MG (400 UNITS) capsule Take 400 Units by mouth daily.   zolpidem 10 MG tablet Commonly known as: AMBIEN Take 10 mg by mouth at bedtime as needed for sleep.        Allergies:  Allergies  Allergen Reactions   Ace Inhibitors Other (See Comments)    Family History: Family History  Problem Relation Age of Onset   Coronary artery disease Father    Heart attack Father    Coronary artery disease Mother    Heart attack Mother    Ovarian cancer Sister 64       sister had hormonal therapy for IVF txs-which increased risk factor for ovarian cancer   Breast cancer Neg Hx     Social History:  reports that she quit smoking about 24 years ago. Her smoking use included cigarettes. She has a 20.00 pack-year smoking history. She has never used smokeless tobacco. She reports that she does not drink alcohol and does not use drugs.   Physical Exam: BP 119/71   Pulse 85   Ht 5\' 8"  (1.727 m)   Wt 160 lb (72.6 kg)   BMI 24.33 kg/m   Constitutional:  Alert and oriented, No acute distress. HEENT: [place] AT, moist mucus membranes.  Trachea midline, no masses. Respiratory: Normal respiratory effort, no increased work of breathing. Psychiatric: Normal mood and affect.  Laboratory Data:  Urinalysis Dipstick nitrite positive/1+ protein Microscopy 6-10 WBC/many bacteria  Pertinent Imaging: CT images 12/02/2020 were personally reviewed and interpreted   Assessment & Plan:    1.  Recurrent UTI UA today with pyuria and bacteriuria with symptoms Urine culture ordered Rx Septra DS sent to pharmacy pending culture result  2.  Possible colovesical fistula Persistent gas in bladder and thickened sigmoid colon on CT x2 Cystoscopy with bullous edema General surgery referral for further evaluation If not felt to have a colovesical fistula will schedule cystoscopy with bladder biopsy   Abbie Sons, MD  [place] [address] [place],   [id number] [phone]

## 2020-12-15 ENCOUNTER — Encounter: Payer: Self-pay | Admitting: Family Medicine

## 2020-12-15 ENCOUNTER — Ambulatory Visit: Payer: Medicare Other | Admitting: Surgery

## 2020-12-15 LAB — CULTURE, URINE COMPREHENSIVE

## 2020-12-16 ENCOUNTER — Other Ambulatory Visit (INDEPENDENT_AMBULATORY_CARE_PROVIDER_SITE_OTHER): Payer: Self-pay | Admitting: Nurse Practitioner

## 2020-12-17 ENCOUNTER — Ambulatory Visit: Payer: Self-pay | Admitting: Surgery

## 2020-12-17 ENCOUNTER — Other Ambulatory Visit: Payer: Self-pay

## 2020-12-17 ENCOUNTER — Telehealth: Payer: Self-pay

## 2020-12-17 ENCOUNTER — Ambulatory Visit (INDEPENDENT_AMBULATORY_CARE_PROVIDER_SITE_OTHER): Payer: Medicare Other | Admitting: Surgery

## 2020-12-17 ENCOUNTER — Encounter: Payer: Self-pay | Admitting: Surgery

## 2020-12-17 VITALS — BP 157/72 | HR 89 | Temp 98.5°F | Ht 65.0 in | Wt 165.0 lb

## 2020-12-17 DIAGNOSIS — N321 Vesicointestinal fistula: Secondary | ICD-10-CM

## 2020-12-17 DIAGNOSIS — E1322 Other specified diabetes mellitus with diabetic chronic kidney disease: Secondary | ICD-10-CM

## 2020-12-17 DIAGNOSIS — N390 Urinary tract infection, site not specified: Secondary | ICD-10-CM

## 2020-12-17 NOTE — Patient Instructions (Addendum)
Our surgery scheduler Britta Mccreedy will call you within 24-48 hours to get you scheduled. If you have not heard from her after 48 hours, please call our office. You will need to get Covid tested before surgery and have the blue sheet available when she calls to write down important information.  We will obtain a clearance from Dr. Wyn Quaker.   If you have any concerns or questions, please feel free to call our office.   Urinary Tract Infection, Adult A urinary tract infection (UTI) is an infection of any part of the urinary tract. The urinary tract includes: The kidneys. The ureters. The bladder. The urethra. These organs make, store, and get rid of pee (urine) in the body. What are the causes? This infection is caused by germs (bacteria) in your genital area. These germs grow and cause swelling (inflammation) of your urinary tract. What increases the risk? The following factors may make you more likely to develop this condition: Using a small, thin tube (catheter) to drain pee. Not being able to control when you pee or poop (incontinence). Being female. If you are female, these things can increase the risk: Using these methods to prevent pregnancy: A medicine that kills sperm (spermicide). A device that blocks sperm (diaphragm). Having low levels of a female hormone (estrogen). Being pregnant. You are more likely to develop this condition if: You have genes that add to your risk. You are sexually active. You take antibiotic medicines. You have trouble peeing because of: A prostate that is bigger than normal, if you are female. A blockage in the part of your body that drains pee from the bladder. A kidney stone. A nerve condition that affects your bladder. Not getting enough to drink. Not peeing often enough. You have other conditions, such as: Diabetes. A weak disease-fighting system (immune system). Sickle cell disease. Gout. Injury of the spine. What are the signs or  symptoms? Symptoms of this condition include: Needing to pee right away. Peeing small amounts often. Pain or burning when peeing. Blood in the pee. Pee that smells bad or not like normal. Trouble peeing. Pee that is cloudy. Fluid coming from the vagina, if you are female. Pain in the belly or lower back. Other symptoms include: Vomiting. Not feeling hungry. Feeling mixed up (confused). This may be the first symptom in older adults. Being tired and grouchy (irritable). A fever. Watery poop (diarrhea). How is this treated? Taking antibiotic medicine. Taking other medicines. Drinking enough water. In some cases, you may need to see a specialist. Follow these instructions at home: Medicines Take over-the-counter and prescription medicines only as told by your doctor. If you were prescribed an antibiotic medicine, take it as told by your doctor. Do not stop taking it even if you start to feel better. General instructions Make sure you: Pee until your bladder is empty. Do not hold pee for a long time. Empty your bladder after sex. Wipe from front to back after peeing or pooping if you are a female. Use each tissue one time when you wipe. Drink enough fluid to keep your pee pale yellow. Keep all follow-up visits. Contact a doctor if: You do not get better after 1-2 days. Your symptoms go away and then come back. Get help right away if: You have very bad back pain. You have very bad pain in your lower belly. You have a fever. You have chills. You feeling like you will vomit or you vomit. Summary A urinary tract infection (UTI) is an infection  of any part of the urinary tract. This condition is caused by germs in your genital area. There are many risk factors for a UTI. Treatment includes antibiotic medicines. Drink enough fluid to keep your pee pale yellow. This information is not intended to replace advice given to you by your health care provider. Make sure you discuss any  questions you have with your health care provider.  Anal Fistula  An anal fistula is a hole that develops between the bowel and the skin near the anus. The anus allows stool (feces) to leave the body. The anus has many tiny glands that make lubricating fluid. Sometimes, these glands become plugged and infected. This can cause a fluid-filled pocket (abscess) to form. An anal fistula often occurs when an abscess becomes infected and then develops into a hole between the bowel and the skin. What are the causes? In most cases, an anal fistula is caused by a past or current buildup of pus around the anus (anal abscess). Other causes include: A complication of surgery. Injury to the rectum or the area around it. Using high-energy beams (radiation) to treat the area around the rectum. What increases the risk? You are more likely to develop this condition if you have certain medical conditions or diseases, including: Chronic inflammatory bowel disease, such as Crohn's disease or ulcerative colitis. Colon cancer or rectal cancer. Diverticular disease, such as diverticulitis. A sexually transmitted infection, or STI, such as gonorrhea, chlamydia, or syphilis. An infection that is caused by HIV. What are the signs or symptoms? Symptoms of this condition include: Throbbing or constant pain that may be worse while you are sitting. Swelling or irritation around the anus. Pus or blood from an opening near the anus. Pain when passing stool. Fever or chills. How is this diagnosed? This condition is diagnosed based on: A physical exam. This may include: An exam to find the external opening of the fistula. An exam with a probe or scope to help locate the internal opening of the fistula. An exam of the rectum with a gloved hand (digital rectal exam). Imaging tests that use dye to find the exact location and path of the fistula. Tests may include: X-rays. Ultrasound. CT scan. MRI. Other tests to find  the cause of the anal fistula. How is this treated? This condition is most commonly treated with surgery. The type of surgery that is used will depend on where the fistula is located and how complex the fistula is. Surgery may include: A fistulotomy. The whole fistula is opened up, and the contents are drained to promote healing. Seton placement. A silk string (seton) is placed into the fistula during a fistulotomy. This helps to drain any infection and promote healing. Advancement flap procedure. Tissue is removed from your rectum or the skin around the anus and attached to the opening of the fistula. Bioprosthetic plug. A cone-shaped plug is made from your tissue and is used to block the opening of the fistula. Some anal fistulas do not require surgery. A nonsurgical treatment option involves injecting a fibrin glue to seal the fistula. You also may be prescribed an antibiotic medicine to treat any infection. Follow these instructions at home: Medicines Take over-the-counter and prescription medicines only as told by your health care provider. If you were prescribed an antibiotic medicine, take it as told by your health care provider. Do not stop taking the antibiotic even if you start to feel better. Use a stool softener or a laxative if told to  do so by your health care provider. General instructions  Eat a high-fiber diet as told by your health care provider. This can help to prevent constipation. Drink enough fluid to keep your urine pale yellow. Take a warm sitz bath for 15-20 minutes, 3-4 times per day, or as told by your health care provider. Sitz baths can ease your pain and discomfort and help with healing. Follow good hygiene to keep the anal area as clean and dry as possible. Use wet toilet paper or a moist towelette after each bowel movement. Keep all follow-up visits as told by your health care provider. This is important. Contact a health care provider if you have: Increased pain  that is not controlled with medicines. New redness or swelling around the anal area. New fluid, blood, or pus coming from the anal area. Tenderness or warmth around the anal area. Get help right away if you have: A fever. Severe pain. Chills or diarrhea. Severe problems urinating or having a bowel movement. Summary An anal fistula is a hole that develops between the bowel and the skin near the anus. This condition is most often caused by a buildup of pus around the anus (anal abscess). Other causes include a complication of surgery, an injury to the rectum, or the use of radiation to treat the rectal area. This condition is most commonly treated with surgery. Follow your health care provider's instructions about taking medicines, eating and drinking, or taking sitz baths. Call your health care provider if you have more pain, swelling, or blood. Get help right away if you have fever, severe pain, or problems passing urine or stool. This information is not intended to replace advice given to you by your health care provider. Make sure you discuss any questions you have with your health care provider.

## 2020-12-17 NOTE — Telephone Encounter (Signed)
Faxed Vascular clearance to Dr. Wyn Quaker at [phone].

## 2020-12-17 NOTE — Progress Notes (Signed)
[facility] Note  12/18/2020     CHIEF COMPLAINT Patient presents for Retina Follow Up   HISTORY OF PRESENT ILLNESS: Cheryl Leonard is a 75 y.o. female who presents to the clinic today for:   HPI     Retina Follow Up   Patient presents with  CRVO/BRVO.  In right eye.  This started 4 weeks ago.  I, the attending physician,  performed the HPI with the patient and updated documentation appropriately.        Comments   Patient here for 4 weeks retina follow up for BRVO OD. Patient states vision doing the same. No eye pain. Having surgery next month in bladder and intestines.      Last edited by Bernarda Caffey, MD on 12/18/2020  3:50 PM.      Referring physician: Rusty Aus, [place],  [place] [address]  HISTORICAL INFORMATION:   Selected notes from the MEDICAL RECORD NUMBER Referred by Dr. Marvel Plan for concern of DME OD Lab Results  Component Value Date   HGBA1C 6.0 (H) 10/16/2020       CURRENT MEDICATIONS: No current outpatient medications on file. (Ophthalmic Drugs)   No current facility-administered medications for this visit. (Ophthalmic Drugs)   Current Outpatient Medications (Other)  Medication Sig   ALPRAZolam (XANAX) 0.25 MG tablet Take 0.25 mg by mouth daily as needed for anxiety or sleep.    amLODipine (NORVASC) 5 MG tablet Take 5 mg by mouth daily.   aspirin EC 81 MG tablet Take 81 mg by mouth daily.    cholecalciferol (VITAMIN D) 1000 units tablet Take 1,000 Units by mouth 2 (two) times daily.   CORAL CALCIUM PO Take 1 tablet by mouth 2 (two) times daily.    denosumab (PROLIA) 60 MG/ML SOLN injection Inject 60 mg into the skin every 6 (six) months.    escitalopram (LEXAPRO) 10 MG tablet Take 10 mg by mouth daily.   estradiol (ESTRACE) 0.1 MG/GM vaginal cream Apply one pea-sized amount around the opening of the urethra daily for two weeks, then three times  weekly thereafter.   famotidine (PEPCID) 40 MG tablet Take 40 mg by mouth daily.   furosemide (LASIX) 20 MG tablet Take 20 mg by mouth daily as needed for fluid or edema.    levothyroxine (SYNTHROID, LEVOTHROID) 100 MCG tablet Take 100 mcg by mouth daily before breakfast.    lidocaine-prilocaine (EMLA) cream Apply 1 application topically as needed (apply prior to port a cath access).   Magnesium 500 MG TABS Take 500 mg by mouth every morning.    metFORMIN (GLUCOPHAGE) 1000 MG tablet Take 1 tablet by mouth 2 (two) times daily with a meal.   Multiple Vitamin (MULTIVITAMIN WITH MINERALS) TABS tablet Take 1 tablet by mouth daily.   nystatin cream (MYCOSTATIN) Apply 1 application topically 2 (two) times daily.   olmesartan (BENICAR) 20 MG tablet Take 20 mg by mouth daily.   pantoprazole (PROTONIX) 40 MG tablet Take 40 mg by mouth every morning.    rosuvastatin (CRESTOR) 20 MG tablet Take 20 mg by mouth every morning.   sulfamethoxazole-trimethoprim (BACTRIM DS) 800-160 MG tablet Take 1 tablet by mouth every 12 (twelve) hours.   TRESIBA FLEXTOUCH 200 UNIT/ML SOPN Inject 30 Units as directed at bedtime.    vitamin B-12 (CYANOCOBALAMIN) 100 MCG tablet Take 100 mcg by mouth daily.   vitamin E 400 UNIT capsule Take 400 Units by  mouth daily.   XARELTO 20 MG TABS tablet TAKE 1 TABLET BY MOUTH DAILY WITH SUPPER.   zolpidem (AMBIEN) 10 MG tablet Take 10 mg by mouth at bedtime as needed for sleep.   metoprolol succinate (TOPROL-XL) 50 MG 24 hr tablet Take 50 mg by mouth daily.   mirtazapine (REMERON) 15 MG tablet Take 15 mg by mouth at bedtime.   No current facility-administered medications for this visit. (Other)   REVIEW OF SYSTEMS: ROS   Positive for: Neurological, Genitourinary, Musculoskeletal, Endocrine, Eyes Negative for: Constitutional, Gastrointestinal, Skin, HENT, Cardiovascular, Respiratory, Psychiatric, Allergic/Imm, Heme/Lymph Last edited by Theodore Demark, COA on 12/18/2020  1:48 PM.       ALLERGIES Allergies  Allergen Reactions   Ace Inhibitors Other (See Comments)   PAST MEDICAL HISTORY Past Medical History:  Diagnosis Date   Anemia    Anxiety    Arthritis    Gout   Cataracts, both eyes    Diabetic retinopathy ([place])    NPDR OU   Diabetic retinopathy ([place])    GERD (gastroesophageal reflux disease)    Gout    Headache    h/o migraines   History of fracture of patella    right knee   History of positive PPD    Patient always shows positive   Hyperlipidemia    Hypertension    Hypertensive retinopathy    OU   Hypothyroidism    Lichen sclerosus 65/03/5463   of vulva   Metatarsal fracture    Neuropathy    Peripheral vascular disease (HCC)    Polyneuropathy    numbness and tingling in feet and toes   Renal insufficiency    Stage 3   Sleep apnea    does not use cpap-lost weight    Type 2 diabetes mellitus, uncontrolled    Past Surgical History:  Procedure Laterality Date   ABDOMINAL HYSTERECTOMY     AMPUTATION TOE Right 05/08/2020   Procedure: AMPUTATION TOE-Right 4th Toe;  Surgeon: Caroline More, DPM;  Location: [facility];  Service: Podiatry;  Laterality: Right;   APPENDECTOMY     BREAST REDUCTION SURGERY  2001   CATARACT EXTRACTION     CESAREAN SECTION  1976   COLONOSCOPY  03/05/2013   Nml - due for repeat 03/06/2018   COLONOSCOPY WITH PROPOFOL N/A 03/18/2019   Procedure: COLONOSCOPY WITH PROPOFOL;  Surgeon: Toledo, Benay Pike, MD;  Location: [facility];  Service: Gastroenterology;  Laterality: N/A;   DIAGNOSTIC LAPAROSCOPY     DILATION AND CURETTAGE OF UTERUS  1989   ENDARTERECTOMY FEMORAL Bilateral 03/09/2018   Procedure: ENDARTERECTOMY FEMORAL;  Surgeon: Algernon Huxley, MD;  Location: [facility];  Service: Vascular;  Laterality: Bilateral;   ENDARTERECTOMY POPLITEAL Left 03/09/2018   Procedure: ENDARTERECTOMY POPLITEAL AND SFA;  Surgeon: Algernon Huxley, MD;  Location: [facility];  Service: Vascular;  Laterality: Left;   ESOPHAGOGASTRODUODENOSCOPY   03/05/2013   ESOPHAGOGASTRODUODENOSCOPY (EGD) WITH PROPOFOL N/A 03/18/2019   Procedure: ESOPHAGOGASTRODUODENOSCOPY (EGD) WITH PROPOFOL;  Surgeon: Toledo, Benay Pike, MD;  Location: [facility];  Service: Gastroenterology;  Laterality: N/A;   EYE SURGERY     Eyelid Surgery  2012   INTRAMEDULLARY (IM) NAIL INTERTROCHANTERIC Left 10/30/2015   Procedure: INTRAMEDULLARY (IM) NAIL INTERTROCHANTRIC ;  Surgeon: Hessie Knows, MD;  Location: [facility];  Service: Orthopedics;  Laterality: Left;   KYPHOPLASTY N/A 10/25/2018   Procedure: L4 KYPHOPLASTY;  Surgeon: Hessie Knows, MD;  Location: [facility];  Service: Orthopedics;  Laterality: N/A;   LAPAROSCOPIC HYSTERECTOMY  2000   total   LOWER EXTREMITY ANGIOGRAPHY Left 03/08/2017   Procedure: LOWER EXTREMITY ANGIOGRAPHY;  Surgeon: Algernon Huxley, MD;  Location: [place] [facility];  Service: Cardiovascular;  Laterality: Left;   LOWER EXTREMITY ANGIOGRAPHY Left 10/30/2017   Procedure: LOWER EXTREMITY ANGIOGRAPHY;  Surgeon: Algernon Huxley, MD;  Location: [place] [facility];  Service: Cardiovascular;  Laterality: Left;   LOWER EXTREMITY ANGIOGRAPHY Right 03/08/2018   Procedure: LOWER EXTREMITY ANGIOGRAPHY;  Surgeon: Algernon Huxley, MD;  Location: [place] [facility];  Service: Cardiovascular;  Laterality: Right;   LOWER EXTREMITY ANGIOGRAPHY Left 10/01/2018   Procedure: LOWER EXTREMITY ANGIOGRAPHY;  Surgeon: Algernon Huxley, MD;  Location: [place] [facility];  Service: Cardiovascular;  Laterality: Left;   LOWER EXTREMITY ANGIOGRAPHY Right 10/08/2018   Procedure: LOWER EXTREMITY ANGIOGRAPHY;  Surgeon: Algernon Huxley, MD;  Location: [place] [facility];  Service: Cardiovascular;  Laterality: Right;   LOWER EXTREMITY ANGIOGRAPHY Right 05/07/2020   Procedure: Lower Extremity Angiography;  Surgeon: Algernon Huxley, MD;  Location: [place] [facility];  Service: Cardiovascular;  Laterality: Right;   PERIPHERAL VASCULAR INTERVENTION  03/08/2018   Procedure: PERIPHERAL VASCULAR  INTERVENTION;  Surgeon: Algernon Huxley, MD;  Location: [place] [facility];  Service: Cardiovascular;;   PORTA CATH INSERTION N/A 02/17/2020   Procedure: PORTA CATH INSERTION;  Surgeon: Algernon Huxley, MD;  Location: [place] [facility];  Service: Cardiovascular;  Laterality: N/A;   REDUCTION MAMMAPLASTY  1997   SACROPLASTY N/A 10/25/2018   Procedure: S1 SACROPLASTY;  Surgeon: Hessie Knows, MD;  Location: [facility];  Service: Orthopedics;  Laterality: N/A;   FAMILY HISTORY Family History  Problem Relation Age of Onset   Coronary artery disease Father    Heart attack Father    Coronary artery disease Mother    Heart attack Mother    Ovarian cancer Sister 62       sister had hormonal therapy for IVF txs-which increased risk factor for ovarian cancer   Breast cancer Neg Hx    SOCIAL HISTORY Social History   Tobacco Use   Smoking status: Former    Packs/day: 1.00    Years: 20.00    Pack years: 20.00    Types: Cigarettes    Quit date: 03/07/1996    Years since quitting: 24.8   Smokeless tobacco: Never   Tobacco comments:    started smoking at age 2  Vaping Use   Vaping Use: Never used  Substance Use Topics   Alcohol use: No    Alcohol/week: 0.0 standard drinks   Drug use: No       OPHTHALMIC EXAM: Base Eye Exam     Visual Acuity (Snellen - Linear)       Right Left   Dist [place] 20/30 20/20 -1   Dist ph [place] NI          Tonometry (Tonopen, 1:45 PM)       Right Left   Pressure 10 08         Pupils       Dark Light Shape React APD   Right 3 2 Round Minimal None   Left 3 2 Round Minimal None         Visual Fields (Counting fingers)       Left Right    Full Full         Extraocular Movement       Right Left    Full, Ortho Full, Ortho  Neuro/Psych     Oriented x3: Yes   Mood/Affect: Normal         Dilation     Both eyes: 1.0% Mydriacyl, 2.5% Phenylephrine @ 1:45 PM           Slit Lamp and Fundus Exam     External Exam        Right Left   External Normal Normal         Slit Lamp Exam       Right Left   Lids/Lashes dermatochalasis dermatochalasis   Conjunctiva/Sclera White and quiet White and quiet   Cornea arcus; well healed cataract wound; 2-3+ diffuse Punctate epithelial erosions, decreased TBUT, mild Anterior basement membrane dystrophy superiorly arcus; well healed cataract wound, 2-3+ diffuse Punctate epithelial erosions, irregualr epi surface, decreased TBUT   Anterior Chamber Deep and quiet Deep and quiet   Iris Round and dilated Round and dilated   Lens PCIOL; open PC PCIOL; open PC   Anterior Vitreous syneresis, Posterior vitreous detachment, vitreous condensations inferiorly syneresis, Posterior vitreous detachment         Fundus Exam       Right Left   Disc Superior hyperemia and mild edema - improved, mild Pallor Pink and Sharp   C/D Ratio 0.6 0.5   Macula Flat, Blunted foveal reflex, trace cystic changes, +Epiretinal membrane, focal DBH ST mac - improved flat; good foveal reflex, no heme or edema, small pigment clump IT to fovea   Vessels attenuated, Tortuous Vascular attenuation   Periphery Attached; focal dot heme temporal periphery Attached, no heme           IMAGING AND PROCEDURES  Imaging and Procedures for 04/25/17  OCT, Retina - OU - Both Eyes       Right Eye Quality was good. Central Foveal Thickness: 335. Progression has been stable. Findings include abnormal foveal contour, epiretinal membrane, no SRF, intraretinal fluid (Trace cystic changes IN fovea).   Left Eye Quality was good. Central Foveal Thickness: 283. Progression has been stable. Findings include normal foveal contour, no IRF, no SRF (Trace ERM).   Notes *Images captured and stored on drive  Diagnosis / Impression:  OD: Trace cystic changes IN fovea OS: NFP; no IRF/SRF--stable, trace ERM  Clinical management:  See below  Abbreviations: NFP - Normal foveal profile. CME - cystoid macular edema. PED -  pigment epithelial detachment. IRF - intraretinal fluid. SRF - subretinal fluid. EZ - ellipsoid zone. ERM - epiretinal membrane. ORA - outer retinal atrophy. ORT - outer retinal tubulation. SRHM - subretinal hyper-reflective material      Intravitreal Injection, Pharmacologic Agent - OD - Right Eye       Time Out 12/18/2020. 2:45 PM. Confirmed correct patient, procedure, site, and patient consented.   Anesthesia Topical anesthesia was used. Anesthetic medications included Lidocaine 2%, Proparacaine 0.5%.   Procedure Preparation included 5% betadine to ocular surface, eyelid speculum.   Injection: 2 mg aflibercept 2 MG/0.05ML   Route: Intravitreal, Site: Right Eye   NDC: [id number], Lot: [id number], Expiration date: 10/03/2021, Waste: 0.05 mL   Post-op Post injection exam found visual acuity of at least counting fingers. The patient tolerated the procedure well. There were no complications. The patient received written and verbal post procedure care education. Post injection medications were not given.             ASSESSMENT/PLAN:    ICD-10-CM   1. Branch retinal vein occlusion of right eye with macular edema  H34.8310 OCT, Retina -  OU - Both Eyes    Intravitreal Injection, Pharmacologic Agent - OD - Right Eye    aflibercept (EYLEA) SOLN 2 mg    2. Both eyes affected by mild nonproliferative diabetic retinopathy with macular edema, associated with type 2 diabetes mellitus ([place])  F09.3235     3. Essential hypertension  I10     4. Hypertensive retinopathy of both eyes  H35.033     5. Epiretinal membrane (ERM) of right eye  H35.371     6. Pseudophakia of both eyes  Z96.1      1. BRVO w/ CME OD  - by history, pt states symptoms first noticed 2 wks prior to presentation, but reports changes may have occurred prior  - initial exam with differential tortuosity of vessels (OD > OS)  - FA (02.10.20) shows mild late staining / leakage in macula, staining / leakage of disc --  improving CME  - differential includes DM2 (DME), hypertensive retinopathy, inflammatory etiology / uveitis  - S/P IVA OD #1 (02.08.19), #2 (03.11.19), #3 (04.09.19), #4 (05.20.19), #5 (02.10.20)  - gave IVA OD on 2.10.20 due to pending Eylea4U for 2020 -- resulted in increased IRF/CME  - review of OCTs show persistent IRF and cystic changes --  resistance to IVA   - June 2019 -- switched therapies: S/P IVE OD #1 (06.24.19), #2 (07.24.19), #3 (09.04.19), #4 (10.30.19),#5 (12.30.19), #6 (03.23.20), #7 (05.05.20), #8 (07.16.20), #9 (07.17.20), #10 (08.28.20), #11 (10.13.20), # 12 (11.17.20), #13 (2.8.21), #14 (03.09.21), #15 (04.13.21), #16 (05.11.21), #17 (06.17.21), #18 (07.23.21), #19 (08.30.21), #20 (10.04.21), #21 (11.08.21), #22 (12.08.21), #23 (01.31.22), #24 (02.28.22), #25 (04.01.22), #26 (06.15.22), #27 (07.13.22), #28 (08.17.22), #29 (09.21.22), #30 (10.19.22), #31 (11.16.22)  - OCT today shows tr cystic changes at 4 weeks  - BCVA 20/30 -- stable  - Eylea4U benefits investigation completed and pt approved for IVE for 2022  - recommend IVE OD #32 today, 12.16.22 w/ f/u in 4 wks  - RBA of procedure discussed, questions answered  - informed consent obtained  - see procedure note  - Eylea informed consent form obtained and scanned on 11.19.2020  - f/u 4 weeks  -- DFE/OCT/possible injection  2. Mild nonproliferative diabetic retinopathy, both eyes  - A1c 6.0 on 10.14.22  - could be contributing to CME OD  - OS with minimal diabetic retinopathy  - continue to monitor   3,4. Hypertensive retinopathy OU - stable  - as above, may have contributing to CME OD  - discussed importance of tight BP control  - monitor   5. Epiretinal membrane, right eye   - stable nasal ERM  - no indication for surgery at this time   6. Pseudophakia OU  - s/p CE/IOL OU by cataract surgeon in [facility]  - doing well  - monitor   Ophthalmic Meds Ordered this visit:  Meds ordered this encounter  Medications    aflibercept (EYLEA) SOLN 2 mg     Return in about 4 weeks (around 01/15/2021) for 4 wk f/u for BRVO w/CME OD w/DFE/OCT/likely IVE OD.  This document serves as a record of services personally performed by Gardiner Sleeper, MD, PhD. It was created on their behalf by Estill Bakes, COT an ophthalmic technician. The creation of this record is the provider's dictation and/or activities during the visit.    Electronically signed by: Estill Bakes, [place] 12.16.22 @ 3:56 PM   Gardiner Sleeper, M.D., Ph.D. Diseases & Surgery of the Retina and Vitreous [facility] 12/18/2020  I have reviewed the above documentation for accuracy and completeness, and I agree with the above. Gardiner Sleeper, M.D., Ph.D. 12/18/20 3:58 PM   Abbreviations: M myopia (nearsighted); A astigmatism; H hyperopia (farsighted); P presbyopia; Mrx spectacle prescription;  CTL contact lenses; OD right eye; OS left eye; OU both eyes  XT exotropia; ET esotropia; PEK punctate epithelial keratitis; PEE punctate epithelial erosions; DES dry eye syndrome; MGD meibomian gland dysfunction; ATs artificial tears; PFAT's preservative free artificial tears; [place] nuclear sclerotic cataract; PSC posterior subcapsular cataract; ERM epi-retinal membrane; PVD posterior vitreous detachment; RD retinal detachment; DM diabetes mellitus; DR diabetic retinopathy; NPDR non-proliferative diabetic retinopathy; PDR proliferative diabetic retinopathy; CSME clinically significant macular edema; DME diabetic macular edema; dbh dot blot hemorrhages; CWS cotton wool spot; POAG primary open angle glaucoma; C/D cup-to-disc ratio; HVF humphrey visual field; GVF goldmann visual field; OCT optical coherence tomography; IOP intraocular pressure; BRVO Branch retinal vein occlusion; CRVO central retinal vein occlusion; CRAO central retinal artery occlusion; BRAO branch retinal artery occlusion; RT retinal tear; SB scleral buckle; PPV pars plana vitrectomy; VH  Vitreous hemorrhage; PRP panretinal laser photocoagulation; IVK intravitreal kenalog; VMT vitreomacular traction; MH Macular hole;  NVD neovascularization of the disc; NVE neovascularization elsewhere; AREDS age related eye disease study; ARMD age related macular degeneration; POAG primary open angle glaucoma; EBMD epithelial/anterior basement membrane dystrophy; ACIOL anterior chamber intraocular lens; IOL intraocular lens; PCIOL posterior chamber intraocular lens; Phaco/IOL phacoemulsification with intraocular lens placement; White photorefractive keratectomy; LASIK laser assisted in situ keratomileusis; HTN hypertension; DM diabetes mellitus; COPD chronic obstructive pulmonary disease

## 2020-12-17 NOTE — Progress Notes (Signed)
Patient ID: Cheryl Leonard, female   DOB: 01/27/1945, 75 y.o.   MRN: [id number]  Chief Complaint: Probable colovesical fistula  History of Present Illness Cheryl Leonard is a 75 y.o. female with history of urosepsis about a year ago, nearly died she reports.   Has had subsequent recurrent E. coli UTIs roughly about every 2 weeks.  She reports some intermittency to her stream, with associated frequency and dysuria.  She has no known history of diverticulitis.  Had a colonoscopy in 2021 which showed just a few diverticula.  CT scans x2 have shown foci of air in the bladder, with some adjacent thickening of the sigmoid colon.  No obvious or clear-cut fistula identified on CT imaging.  She is currently finished a recent course of antibiotics, and wants to pursue her plans over Christmas and new year.  At her request she desires to pursue surgery after the third week of January.  She will be with her daughter who has been useful in her care.  She currently takes Xarelto for her peripheral vascular disease.   History recurrent UTI and overactive bladder CT March 2022 with foci of gas in bladder and thickened sigmoid colon Cystoscopy 09/2020 with with inflamed bladder mucosa with bullous edema anterior bladder wall Follow-up CT 12/02/2020 with persistent gas in bladder Presently with complaints of increased frequency, urgency and dysuria Colonoscopy 03/2019 with a few, small-mouth diverticula in the sigmoid colon Past Medical History Past Medical History:  Diagnosis Date   Anemia    Anxiety    Arthritis    Gout   Cataracts, both eyes    Diabetic retinopathy ([place])    NPDR OU   Diabetic retinopathy ([place])    GERD (gastroesophageal reflux disease)    Gout    Headache    h/o migraines   History of fracture of patella    right knee   History of positive PPD    Patient always shows positive   Hyperlipidemia    Hypertension    Hypertensive retinopathy    OU   Hypothyroidism    Lichen  sclerosus [id number]   of vulva   Metatarsal fracture    Neuropathy    Peripheral vascular disease (HCC)    Polyneuropathy    numbness and tingling in feet and toes   Renal insufficiency    Stage 3   Sleep apnea    does not use cpap-lost weight    Type 2 diabetes mellitus, uncontrolled       Past Surgical History:  Procedure Laterality Date   ABDOMINAL HYSTERECTOMY     AMPUTATION TOE Right 05/08/2020   Procedure: AMPUTATION TOE-Right 4th Toe;  Surgeon: Caroline More, DPM;  Location: [facility];  Service: Podiatry;  Laterality: Right;   APPENDECTOMY     BREAST REDUCTION SURGERY  2001   CATARACT EXTRACTION     CESAREAN SECTION  1976   COLONOSCOPY  03/05/2013   Nml - due for repeat 03/06/2018   COLONOSCOPY WITH PROPOFOL N/A 03/18/2019   Procedure: COLONOSCOPY WITH PROPOFOL;  Surgeon: Toledo, Benay Pike, MD;  Location: [facility];  Service: Gastroenterology;  Laterality: N/A;   DIAGNOSTIC LAPAROSCOPY     DILATION AND CURETTAGE OF UTERUS  1989   ENDARTERECTOMY FEMORAL Bilateral 03/09/2018   Procedure: ENDARTERECTOMY FEMORAL;  Surgeon: Algernon Huxley, MD;  Location: [facility];  Service: Vascular;  Laterality: Bilateral;   ENDARTERECTOMY POPLITEAL Left 03/09/2018   Procedure: ENDARTERECTOMY POPLITEAL AND SFA;  Surgeon: Algernon Huxley, MD;  Location:  [facility];  Service: Vascular;  Laterality: Left;   ESOPHAGOGASTRODUODENOSCOPY  03/05/2013   ESOPHAGOGASTRODUODENOSCOPY (EGD) WITH PROPOFOL N/A 03/18/2019   Procedure: ESOPHAGOGASTRODUODENOSCOPY (EGD) WITH PROPOFOL;  Surgeon: Toledo, Benay Pike, MD;  Location: [facility];  Service: Gastroenterology;  Laterality: N/A;   EYE SURGERY     Eyelid Surgery  2012   INTRAMEDULLARY (IM) NAIL INTERTROCHANTERIC Left 10/30/2015   Procedure: INTRAMEDULLARY (IM) NAIL INTERTROCHANTRIC ;  Surgeon: Hessie Knows, MD;  Location: [facility];  Service: Orthopedics;  Laterality: Left;   KYPHOPLASTY N/A 10/25/2018   Procedure: L4 KYPHOPLASTY;  Surgeon: Hessie Knows, MD;   Location: [facility];  Service: Orthopedics;  Laterality: N/A;   LAPAROSCOPIC HYSTERECTOMY  2000   total   LOWER EXTREMITY ANGIOGRAPHY Left 03/08/2017   Procedure: LOWER EXTREMITY ANGIOGRAPHY;  Surgeon: Algernon Huxley, MD;  Location: [place] [facility];  Service: Cardiovascular;  Laterality: Left;   LOWER EXTREMITY ANGIOGRAPHY Left 10/30/2017   Procedure: LOWER EXTREMITY ANGIOGRAPHY;  Surgeon: Algernon Huxley, MD;  Location: [place] [facility];  Service: Cardiovascular;  Laterality: Left;   LOWER EXTREMITY ANGIOGRAPHY Right 03/08/2018   Procedure: LOWER EXTREMITY ANGIOGRAPHY;  Surgeon: Algernon Huxley, MD;  Location: [place] [facility];  Service: Cardiovascular;  Laterality: Right;   LOWER EXTREMITY ANGIOGRAPHY Left 10/01/2018   Procedure: LOWER EXTREMITY ANGIOGRAPHY;  Surgeon: Algernon Huxley, MD;  Location: [place] [facility];  Service: Cardiovascular;  Laterality: Left;   LOWER EXTREMITY ANGIOGRAPHY Right 10/08/2018   Procedure: LOWER EXTREMITY ANGIOGRAPHY;  Surgeon: Algernon Huxley, MD;  Location: [place] [facility];  Service: Cardiovascular;  Laterality: Right;   LOWER EXTREMITY ANGIOGRAPHY Right 05/07/2020   Procedure: Lower Extremity Angiography;  Surgeon: Algernon Huxley, MD;  Location: [place] [facility];  Service: Cardiovascular;  Laterality: Right;   PERIPHERAL VASCULAR INTERVENTION  03/08/2018   Procedure: PERIPHERAL VASCULAR INTERVENTION;  Surgeon: Algernon Huxley, MD;  Location: [place] [facility];  Service: Cardiovascular;;   PORTA CATH INSERTION N/A 02/17/2020   Procedure: PORTA CATH INSERTION;  Surgeon: Algernon Huxley, MD;  Location: [place] [facility];  Service: Cardiovascular;  Laterality: N/A;   REDUCTION MAMMAPLASTY  1997   SACROPLASTY N/A 10/25/2018   Procedure: S1 SACROPLASTY;  Surgeon: Hessie Knows, MD;  Location: [facility];  Service: Orthopedics;  Laterality: N/A;    Allergies  Allergen Reactions   Ace Inhibitors Other (See Comments)    Current Outpatient Medications  Medication  Sig Dispense Refill   ALPRAZolam (XANAX) 0.25 MG tablet Take 0.25 mg by mouth daily as needed for anxiety or sleep.      amLODipine (NORVASC) 5 MG tablet Take 5 mg by mouth daily.     aspirin EC 81 MG tablet Take 81 mg by mouth daily.      cholecalciferol (VITAMIN D) 1000 units tablet Take 1,000 Units by mouth 2 (two) times daily.     CORAL CALCIUM PO Take 1 tablet by mouth 2 (two) times daily.      denosumab (PROLIA) 60 MG/ML SOLN injection Inject 60 mg into the skin every 6 (six) months.      escitalopram (LEXAPRO) 10 MG tablet Take 10 mg by mouth daily.     estradiol (ESTRACE) 0.1 MG/GM vaginal cream Apply one pea-sized amount around the opening of the urethra daily for two weeks, then three times weekly thereafter. 42.5 g 5   famotidine (PEPCID) 40 MG tablet Take 40 mg by mouth daily.     furosemide (LASIX) 20 MG  tablet Take 20 mg by mouth daily as needed for fluid or edema.      levothyroxine (SYNTHROID, LEVOTHROID) 100 MCG tablet Take 100 mcg by mouth daily before breakfast.   3   lidocaine-prilocaine (EMLA) cream Apply 1 application topically as needed (apply prior to port a cath access). 30 g 3   Magnesium 500 MG TABS Take 500 mg by mouth every morning.      metFORMIN (GLUCOPHAGE) 1000 MG tablet Take 1 tablet by mouth 2 (two) times daily with a meal.     Multiple Vitamin (MULTIVITAMIN WITH MINERALS) TABS tablet Take 1 tablet by mouth daily.     nystatin cream (MYCOSTATIN) Apply 1 application topically 2 (two) times daily.     olmesartan (BENICAR) 20 MG tablet Take 20 mg by mouth daily.     pantoprazole (PROTONIX) 40 MG tablet Take 40 mg by mouth every morning.      rosuvastatin (CRESTOR) 20 MG tablet Take 20 mg by mouth every morning.     sulfamethoxazole-trimethoprim (BACTRIM DS) 800-160 MG tablet Take 1 tablet by mouth every 12 (twelve) hours. 14 tablet 0   TRESIBA FLEXTOUCH 200 UNIT/ML SOPN Inject 30 Units as directed at bedtime.   5   vitamin B-12 (CYANOCOBALAMIN) 100 MCG tablet  Take 100 mcg by mouth daily.     vitamin E 400 UNIT capsule Take 400 Units by mouth daily.     XARELTO 20 MG TABS tablet TAKE 1 TABLET BY MOUTH DAILY WITH SUPPER. 90 tablet 3   zolpidem (AMBIEN) 10 MG tablet Take 10 mg by mouth at bedtime as needed for sleep.     metoprolol succinate (TOPROL-XL) 50 MG 24 hr tablet Take 50 mg by mouth daily.     mirtazapine (REMERON) 15 MG tablet Take 15 mg by mouth at bedtime.     No current facility-administered medications for this visit.    Family History Family History  Problem Relation Age of Onset   Coronary artery disease Father    Heart attack Father    Coronary artery disease Mother    Heart attack Mother    Ovarian cancer Sister 56       sister had hormonal therapy for IVF txs-which increased risk factor for ovarian cancer   Breast cancer Neg Hx       Social History Social History   Tobacco Use   Smoking status: Former    Packs/day: 1.00    Years: 20.00    Pack years: 20.00    Types: Cigarettes    Quit date: 03/07/1996    Years since quitting: 24.7   Smokeless tobacco: Never   Tobacco comments:    started smoking at age 64  Vaping Use   Vaping Use: Never used  Substance Use Topics   Alcohol use: No    Alcohol/week: 0.0 standard drinks   Drug use: No        Review of Systems  Constitutional:  Positive for malaise/fatigue.  HENT:  Positive for hearing loss.   Cardiovascular:  Positive for claudication and leg swelling.  Gastrointestinal:  Positive for blood in stool and melena.  Genitourinary:  Positive for frequency and urgency.  Musculoskeletal:  Negative for back pain and neck pain.  Skin: Negative.   Neurological: Negative.   Psychiatric/Behavioral: Negative.       Physical Exam Blood pressure (!) 157/72, pulse 89, temperature 98.5 F (36.9 C), temperature source Oral, height 5\' 5"  (1.651 m), weight 165 lb (74.8 kg), SpO2 98 %.  Last Weight  Most recent update: 12/17/2020 10:50 AM    Weight  74.8 kg (165 lb)              CONSTITUTIONAL: Well developed, and nourished, appropriately responsive and aware without distress.   EYES: Sclera non-icteric.   EARS, NOSE, MOUTH AND THROAT: Mask worn.    Hearing is intact to voice.  NECK: Trachea is midline, and there is no jugular venous distension.  LYMPH NODES:  Lymph nodes in the neck are not enlarged. RESPIRATORY:  Lungs are clear, and breath sounds are equal bilaterally. Normal respiratory effort without pathologic use of accessory muscles. CARDIOVASCULAR: Heart is regular in rate and rhythm. GI: The abdomen has a midline infraumbilical scar, otherwise soft, nontender, and nondistended. There were no palpable masses. I did not appreciate hepatosplenomegaly. There were normal bowel sounds. MUSCULOSKELETAL:  Symmetrical muscle tone appreciated in all four extremities.    SKIN: Skin turgor is normal. No pathologic skin lesions appreciated.  NEUROLOGIC:  Motor and sensation appear grossly normal.  Cranial nerves are grossly without defect. PSYCH:  Alert and oriented to person, place and time. Affect is appropriate for situation.  Data Reviewed I have personally reviewed what is currently available of the patient's imaging, recent labs and medical records.   Labs:  CBC Latest Ref Rng & Units 10/16/2020 08/12/2020 08/11/2020  WBC 4.0 - 10.5 K/uL 6.1 3.7(L) 5.1  Hemoglobin 12.0 - 15.0 g/dL 9.3(L) 8.2(L) 9.1(L)  Hematocrit 36.0 - 46.0 % 28.8(L) 26.0(L) 28.5(L)  Platelets 150 - 400 K/uL 262 251 293   CMP Latest Ref Rng & Units 10/16/2020 08/12/2020 08/11/2020  Glucose 70 - 99 mg/dL 195(H) 102(H) 99  BUN 8 - 23 mg/dL 38(H) 23 33(H)  Creatinine 0.44 - 1.00 mg/dL 1.94(H) 1.74(H) 2.15(H)  Sodium 135 - 145 mmol/L 133(L) 141 135  Potassium 3.5 - 5.1 mmol/L 5.1 4.9 5.0  Chloride 98 - 111 mmol/L 105 110 106  CO2 22 - 32 mmol/L 21(L) 23 25  Calcium 8.9 - 10.3 mg/dL 9.2 8.9 8.8(L)  Total Protein 6.5 - 8.1 g/dL 6.8 - 6.8  Total Bilirubin 0.3 - 1.2 mg/dL 0.6 - 0.7   Alkaline Phos 38 - 126 U/L 46 - 45  AST 15 - 41 U/L 22 50(H) 48(H)  ALT 0 - 44 U/L 19 - 30    Imaging: Radiology review:  CLINICAL DATA:  Recurrent urinary tract infection and frequent urination for 1 year. Gas in bladder. Thickened sigmoid colon.   EXAM: CT ABDOMEN AND PELVIS WITHOUT CONTRAST   TECHNIQUE: Multidetector CT imaging of the abdomen and pelvis was performed following the standard protocol without IV contrast.   COMPARISON:  CT abdomen and pelvis 08/11/2020.   FINDINGS: Lower chest: No acute abnormality.   Hepatobiliary: No focal liver abnormality. Stones identified within the gallbladder. No gallbladder wall inflammation or bile duct dilatation.   Pancreas: Unremarkable. No pancreatic ductal dilatation or surrounding inflammatory changes.   Spleen: Normal in size without focal abnormality.   Adrenals/Urinary Tract: Normal adrenal glands. Punctate stone within upper pole collecting system of the right kidney noted, image 27/2. No left renal calculi. No hydronephrosis or hydroureter identified bilaterally. No focal bladder abnormality or bladder calculi. Gas noted within the non dependent portion of the bladder.   Stomach/Bowel: Stomach appears normal. Status post appendectomy. No bowel wall thickening, inflammation, or distension.   Vascular/Lymphatic: Aortic atherosclerosis. No aneurysm. No signs of abdominopelvic adenopathy.   Reproductive: Status post hysterectomy. No adnexal masses.   Other:  Fat-containing inguinal hernias noted bilaterally. No free fluid or fluid collections.   Musculoskeletal: Status post ORIF of the left femur bilateral sacroplasty has been performed as noted previously. Treated compression deformity noted at L4. Unchanged compression fracture involving the L3 vertebra. Multilevel thoracolumbar degenerative disc disease.   IMPRESSION: 1. No acute findings within the abdomen or pelvis. 2. Punctate right upper pole renal  calculus. 3. Gas noted non dependent portion of the bladder. In the absence of recent instrumentation this may be related to infection. 4. Gallstones. 5. Aortic atherosclerosis (ICD10-I70.0).     Electronically Signed   By: Kerby Moors M.D.   On: 12/03/2020 14:38 Within last 24 hrs: No results found. CLINICAL DATA:  75 year old female with left lower quadrant abdominal pain and episodes of bloody stools over the past 4 days.   EXAM: CT ABDOMEN AND PELVIS WITHOUT CONTRAST   TECHNIQUE: Multidetector CT imaging of the abdomen and pelvis was performed following the standard protocol without IV contrast.   COMPARISON:  CT Abdomen and Pelvis 04/01/2020 and earlier.   FINDINGS: Lower chest: Stable, negative.   Hepatobiliary: Chronic cholelithiasis. The gallbladder is larger today but there is no pericholecystic inflammation. Negative noncontrast liver.   Pancreas: Negative.   Spleen: Negative.   Adrenals/Urinary Tract: Normal adrenal glands. Nonobstructed kidneys. 2-3 mm right upper pole nephrolithiasis. Questionable punctate left nephrolithiasis. Scattered small areas of renal cortical scarring appear stable. Proximal ureters are decompressed. Diminutive and unremarkable bladder.   Stomach/Bowel: Redundant distal large bowel with some retained stool. Subtle 7 cm segment of sigmoid colon inflammation suspected on series 2, image 73 with mild sigmoid wall thickening and an indistinct appearance of the wall. No regional diverticula. No mesenteric inflammation.   Upstream descending colon is within normal limits. Mildly redundant transverse colon. Negative right colon. Diminutive or absent appendix. Decompressed and negative terminal ileum. No dilated small bowel. Negative stomach and duodenum. No free air or free fluid. Stable postoperative changes to the lower midline abdominal wall.   Vascular/Lymphatic: Extensive Aortoiliac calcified atherosclerosis. Chronic  postoperative changes overlying the right common femoral artery with bilateral proximal femoral vascular stents. Vascular patency is not evaluated in the absence of IV contrast. No lymphadenopathy.   Reproductive: Absent uterus.  Diminutive or absent ovaries.   Other: No pelvic free fluid. Small fat containing inguinal hernias are stable.   Musculoskeletal: Stable visualized osseous structures. Chronic lumbar compression fractures, L4 previously augmented. Previous bilateral sacroplasty. Previous left femur ORIF.   IMPRESSION: 1. Subtle wall thickening of a 7 cm segment of the sigmoid colon suspicious for mild colitis in this setting. No associated diverticula.   2. No other acute or inflammatory process identified in the non-contrast abdomen or pelvis. Chronic cholelithiasis. Right nephrolithiasis. Aortic Atherosclerosis (ICD10-I70.0). Bilateral proximal femoral artery stents.     Electronically Signed   By: Genevie Ann M.D.   On: 08/11/2020 07:11 Assessment    Recurrent UTIs consistent with colovesical fistula despite known diverticulitis or other primary colonic pathology. Patient Active Problem List   Diagnosis Date Noted   Rectal bleeding 08/11/2020   Colitis 08/11/2020   AKI (acute kidney injury) ([place]) 08/11/2020   Rectal bleed 08/11/2020   Gangrene of toe of right foot ([place]) 05/04/2020   History of 2019 novel coronavirus disease (COVID-19) 02/20/2019   Anemia of chronic kidney failure, stage 3 (moderate) ([place]) 02/05/2019   Normocytic anemia 02/01/2019   Osteoporosis 11/15/2018   Atherosclerosis of native arteries of extremity with rest pain ([place]) 09/25/2018   Osteoporosis,  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   Wound disruption, post-op, skin, subsequent encounter 04/09/2018   Postoperative wound infection 03/21/2018   Atherosclerosis of artery of extremity with intermittent claudication (HCC) 03/08/2018   Atherosclerosis of native arteries of the extremities with ulceration  (HCC) 10/13/2017   DM type 2 with diabetic peripheral neuropathy (HCC) 10/12/2017   History of hip fracture 10/12/2017   Type 2 diabetes mellitus with vascular disease (HCC) 10/12/2017   Carotid atherosclerosis, bilateral 07/11/2017   Medicare annual wellness visit, initial 05/03/2017   Peripheral vascular disease, unspecified (HCC) 03/01/2017   Diabetic ulcer of toe of left foot associated with type 2 diabetes mellitus (HCC) 03/01/2017   Chronic painful diabetic neuropathy (HCC) 06/02/2016   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   Hip fracture (HCC) 10/30/2015   Greater trochanter fracture (HCC) 10/29/2015   Acquired hypothyroidism 10/16/2015   Stage 3b chronic kidney disease (HCC) 10/16/2015   Hyperlipidemia, mixed 04/16/2015   Benign essential hypertension 04/16/2015   Closed compression fracture of lumbar vertebra (HCC) 04/16/2015   Iron deficiency anemia 09/22/2014   Disease of thyroid gland 09/22/2014   Diabetes mellitus type 2, uncontrolled 05/13/2014   Lichen sclerosus 12/30/2013   History of positive PPD 10/08/2013    Plan    Recommended robotic assisted sigmoid colectomy, possible mobilization of splenic flexure.  We will definitely wish to utilize ureteral imaging through instillation of ICG and appreciate urology support. We discussed the risks of colectomy, including but not limited to the following: Anesthesia, bleeding, injury to adjacent structures, anastomotic leak, recurrence of fistula, possible need for additional procedures i.e. colostomy (worse case scenario) I believe she understands the above are not all inclusive and desires to proceed, we discussed with holding her Xarelto for surgery I do not think it will be an issue but we will run this by Dr. Wyn Quaker.   She would like to defer this until the week of 23 January.  Face-to-face time spent with the patient and accompanying care providers(if present) was 30 minutes, with more than 50% of the time  spent counseling, educating, and coordinating care of the patient.    These notes generated with voice recognition software. I apologize for typographical errors.  Campbell Lerner M.D., FACS 12/17/2020, 1:26 PM

## 2020-12-18 ENCOUNTER — Ambulatory Visit (INDEPENDENT_AMBULATORY_CARE_PROVIDER_SITE_OTHER): Payer: Medicare Other | Admitting: Ophthalmology

## 2020-12-18 ENCOUNTER — Encounter (INDEPENDENT_AMBULATORY_CARE_PROVIDER_SITE_OTHER): Payer: Self-pay | Admitting: Ophthalmology

## 2020-12-18 ENCOUNTER — Telehealth: Payer: Self-pay | Admitting: Surgery

## 2020-12-18 DIAGNOSIS — H34831 Tributary (branch) retinal vein occlusion, right eye, with macular edema: Secondary | ICD-10-CM | POA: Diagnosis not present

## 2020-12-18 DIAGNOSIS — Z961 Presence of intraocular lens: Secondary | ICD-10-CM

## 2020-12-18 DIAGNOSIS — H35371 Puckering of macula, right eye: Secondary | ICD-10-CM

## 2020-12-18 DIAGNOSIS — E113213 Type 2 diabetes mellitus with mild nonproliferative diabetic retinopathy with macular edema, bilateral: Secondary | ICD-10-CM | POA: Diagnosis not present

## 2020-12-18 DIAGNOSIS — H35033 Hypertensive retinopathy, bilateral: Secondary | ICD-10-CM

## 2020-12-18 DIAGNOSIS — I10 Essential (primary) hypertension: Secondary | ICD-10-CM

## 2020-12-18 MED ORDER — AFLIBERCEPT 2MG/0.05ML IZ SOLN FOR KALEIDOSCOPE
2.0000 mg | INTRAVITREAL | Status: AC | PRN
Start: 2020-12-18 — End: 2020-12-18
  Administered 2020-12-18: 2 mg via INTRAVITREAL

## 2020-12-18 NOTE — Telephone Encounter (Signed)
Patient has been advised of Pre-Admission date/time, COVID Testing date and Surgery date.  Surgery Date: 01/25/21 Preadmission Testing Date: 01/11/21 (phone 8a-1p) Covid Testing Date: 01/22/21 @ 8:00 am  - patient [facility] [facility]) between 8a-12:00 pm   Patient has been made aware to call [phone], between 1-3:00pm the day before surgery, to find out what time to arrive for surgery.   Also we had placed a referral to urology, patient is aware of this and given their number as well for making appointment with them prior to her surgery.

## 2020-12-23 ENCOUNTER — Other Ambulatory Visit: Payer: Self-pay

## 2021-01-05 ENCOUNTER — Telehealth: Payer: Self-pay

## 2021-01-05 NOTE — Telephone Encounter (Signed)
Received vascular clearance from Dr. Festus Barren. Pt's risk assessment is medium and is optimized for surgery.  Per Dr. Wyn Quaker, pt needs a lovenox bridge.

## 2021-01-06 ENCOUNTER — Other Ambulatory Visit: Payer: Self-pay | Admitting: *Deleted

## 2021-01-06 DIAGNOSIS — N183 Chronic kidney disease, stage 3 unspecified: Secondary | ICD-10-CM

## 2021-01-11 ENCOUNTER — Inpatient Hospital Stay: Payer: Medicare Other | Attending: Internal Medicine

## 2021-01-11 ENCOUNTER — Encounter: Payer: Self-pay | Admitting: Internal Medicine

## 2021-01-11 ENCOUNTER — Inpatient Hospital Stay (HOSPITAL_BASED_OUTPATIENT_CLINIC_OR_DEPARTMENT_OTHER): Payer: Medicare Other | Admitting: Internal Medicine

## 2021-01-11 ENCOUNTER — Other Ambulatory Visit: Payer: Self-pay

## 2021-01-11 ENCOUNTER — Encounter
Admission: RE | Admit: 2021-01-11 | Discharge: 2021-01-11 | Disposition: A | Discharge status: Home / Self Care | Payer: Medicare Other | Source: Ambulatory Visit | Attending: Surgery | Admitting: Surgery

## 2021-01-11 ENCOUNTER — Telehealth: Payer: Self-pay

## 2021-01-11 ENCOUNTER — Inpatient Hospital Stay: Payer: Medicare Other

## 2021-01-11 VITALS — BP 134/65 | HR 78 | Temp 98.7°F | Resp 18 | Wt 166.0 lb

## 2021-01-11 VITALS — Ht 65.0 in | Wt 162.0 lb

## 2021-01-11 DIAGNOSIS — D631 Anemia in chronic kidney disease: Secondary | ICD-10-CM | POA: Diagnosis not present

## 2021-01-11 DIAGNOSIS — E1122 Type 2 diabetes mellitus with diabetic chronic kidney disease: Secondary | ICD-10-CM | POA: Diagnosis not present

## 2021-01-11 DIAGNOSIS — Z8249 Family history of ischemic heart disease and other diseases of the circulatory system: Secondary | ICD-10-CM | POA: Diagnosis not present

## 2021-01-11 DIAGNOSIS — E785 Hyperlipidemia, unspecified: Secondary | ICD-10-CM | POA: Insufficient documentation

## 2021-01-11 DIAGNOSIS — M2550 Pain in unspecified joint: Secondary | ICD-10-CM | POA: Insufficient documentation

## 2021-01-11 DIAGNOSIS — Z79899 Other long term (current) drug therapy: Secondary | ICD-10-CM | POA: Insufficient documentation

## 2021-01-11 DIAGNOSIS — E118 Type 2 diabetes mellitus with unspecified complications: Secondary | ICD-10-CM | POA: Diagnosis not present

## 2021-01-11 DIAGNOSIS — D649 Anemia, unspecified: Secondary | ICD-10-CM

## 2021-01-11 DIAGNOSIS — E1159 Type 2 diabetes mellitus with other circulatory complications: Secondary | ICD-10-CM

## 2021-01-11 DIAGNOSIS — R0602 Shortness of breath: Secondary | ICD-10-CM | POA: Insufficient documentation

## 2021-01-11 DIAGNOSIS — R5383 Other fatigue: Secondary | ICD-10-CM | POA: Insufficient documentation

## 2021-01-11 DIAGNOSIS — E611 Iron deficiency: Secondary | ICD-10-CM | POA: Diagnosis not present

## 2021-01-11 DIAGNOSIS — Z01812 Encounter for preprocedural laboratory examination: Secondary | ICD-10-CM

## 2021-01-11 DIAGNOSIS — N183 Chronic kidney disease, stage 3 unspecified: Secondary | ICD-10-CM

## 2021-01-11 DIAGNOSIS — Z8041 Family history of malignant neoplasm of ovary: Secondary | ICD-10-CM | POA: Diagnosis not present

## 2021-01-11 DIAGNOSIS — I10 Essential (primary) hypertension: Secondary | ICD-10-CM | POA: Diagnosis not present

## 2021-01-11 DIAGNOSIS — Z01810 Encounter for preprocedural cardiovascular examination: Secondary | ICD-10-CM | POA: Insufficient documentation

## 2021-01-11 HISTORY — DX: Diverticulitis of intestine, part unspecified, without perforation or abscess without bleeding: K57.92

## 2021-01-11 HISTORY — DX: Other specified disorders of bone density and structure, unspecified site: M85.80

## 2021-01-11 LAB — CBC WITH DIFFERENTIAL/PLATELET
Abs Immature Granulocytes: 0.01 10*3/uL (ref 0.00–0.07)
Basophils Absolute: 0 10*3/uL (ref 0.0–0.1)
Basophils Relative: 1 %
Eosinophils Absolute: 0.2 10*3/uL (ref 0.0–0.5)
Eosinophils Relative: 3 %
HCT: 29 % — ABNORMAL LOW (ref 36.0–46.0)
Hemoglobin: 9.5 g/dL — ABNORMAL LOW (ref 12.0–15.0)
Immature Granulocytes: 0 %
Lymphocytes Relative: 17 %
Lymphs Abs: 1.1 10*3/uL (ref 0.7–4.0)
MCH: 29.1 pg (ref 26.0–34.0)
MCHC: 32.8 g/dL (ref 30.0–36.0)
MCV: 88.7 fL (ref 80.0–100.0)
Monocytes Absolute: 0.8 10*3/uL (ref 0.1–1.0)
Monocytes Relative: 12 %
Neutro Abs: 4.3 10*3/uL (ref 1.7–7.7)
Neutrophils Relative %: 67 %
Platelets: 265 10*3/uL (ref 150–400)
RBC: 3.27 MIL/uL — ABNORMAL LOW (ref 3.87–5.11)
RDW: 14.6 % (ref 11.5–15.5)
WBC: 6.3 10*3/uL (ref 4.0–10.5)
nRBC: 0 % (ref 0.0–0.2)

## 2021-01-11 LAB — COMPREHENSIVE METABOLIC PANEL
ALT: 18 U/L (ref 0–44)
AST: 20 U/L (ref 15–41)
Albumin: 3.7 g/dL (ref 3.5–5.0)
Alkaline Phosphatase: 63 U/L (ref 38–126)
Anion gap: 8 (ref 5–15)
BUN: 34 mg/dL — ABNORMAL HIGH (ref 8–23)
CO2: 24 mmol/L (ref 22–32)
Calcium: 9.7 mg/dL (ref 8.9–10.3)
Chloride: 102 mmol/L (ref 98–111)
Creatinine, Ser: 1.33 mg/dL — ABNORMAL HIGH (ref 0.44–1.00)
GFR, Estimated: 42 mL/min — ABNORMAL LOW (ref >60)
Glucose, Bld: 279 mg/dL — ABNORMAL HIGH (ref 70–99)
Potassium: 4.6 mmol/L (ref 3.5–5.1)
Sodium: 134 mmol/L — ABNORMAL LOW (ref 135–145)
Total Bilirubin: 0.6 mg/dL (ref 0.3–1.2)
Total Protein: 7.1 g/dL (ref 6.5–8.1)

## 2021-01-11 LAB — HEMOGLOBIN A1C
Hgb A1c MFr Bld: 7.3 % — ABNORMAL HIGH (ref 4.8–5.6)
Mean Plasma Glucose: 162.81 mg/dL

## 2021-01-11 LAB — IRON AND TIBC
Iron: 77 ug/dL (ref 28–170)
Saturation Ratios: 19 % (ref 10.4–31.8)
TIBC: 399 ug/dL (ref 250–450)
UIBC: 322 ug/dL

## 2021-01-11 LAB — FERRITIN: Ferritin: 11 ng/mL (ref 11–307)

## 2021-01-11 MED ORDER — HEPARIN SOD (PORK) LOCK FLUSH 100 UNIT/ML IV SOLN
500.0000 [IU] | Freq: Once | INTRAVENOUS | Status: AC | PRN
  Administered 2021-01-11: 500 [IU]
  Filled 2021-01-11: qty 5

## 2021-01-11 MED ORDER — SODIUM CHLORIDE 0.9 % IV SOLN
Freq: Once | INTRAVENOUS | Status: AC
  Filled 2021-01-11: qty 250

## 2021-01-11 MED ORDER — IRON SUCROSE 20 MG/ML IV SOLN
200.0000 mg | Freq: Once | INTRAVENOUS | Status: AC
  Administered 2021-01-11: 200 mg via INTRAVENOUS
  Filled 2021-01-11: qty 10

## 2021-01-11 NOTE — Assessment & Plan Note (Signed)
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  Improved s/p IV iron infusions however today- Hb 9.5. Proceed with Venofer today.    # FEB 2022- Iron saturation 15 %; ferritin 19--anemia secondary to CKD.  Proceed with iron infusions as planned.  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   # Etiology-CKD-III; GFR ~40 overall -stable [Dr.Kolluru]; recommend continued hydration.  #Poor IV access/Mediport placement-stable  # DISPOSITION:  # Venofer today;  # in 1 weeks from now- # follow up 3 months- MD; port; labs- cbc/bmp/iron studies/ferritin-;possible venofer- Dr.B

## 2021-01-11 NOTE — Progress Notes (Signed)
[place] NOTE  Patient Care Team: Rusty Aus, MD as PCP - General (Internal Medicine) Josefine Class, MD as Referring Physician (Gastroenterology) Cammie Sickle, MD as Consulting Physician (Hematology and Oncology)  CHIEF COMPLAINTS/PURPOSE OF CONSULTATION: Anemia  HEMATOLOGY HISTORY  # ANEMIA- Jan 2021- 8.8/ferritin 11 [PCP]; N-WBC/platelets? IDA vs other- EGD-2015/colonoscopy-? 2015; 2020- [Dr.Skulskie] ; capsule-2016- ? 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   HISTORY OF PRESENTING ILLNESS: Ambulating independently.  Alone.  Cheryl Leonard 76 y.o.  female anemia iron deficiency-question CKD-III s here for follow-up.   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  Awaiting surgery on January 27th.   Complains of mild to moderate fatigue.  Denies any blood in stools or black-colored stools.  Review of Systems  Constitutional:  Positive for malaise/fatigue. Negative for chills, diaphoresis and fever.  HENT:  Negative for nosebleeds and sore throat.   Eyes:  Negative for double vision.  Respiratory:  Positive for shortness of breath. Negative for cough, hemoptysis, sputum production and wheezing.   Cardiovascular:  Negative for chest pain, palpitations, orthopnea and leg swelling.  Gastrointestinal:  Negative for abdominal pain, blood in stool, constipation, diarrhea, heartburn, melena, nausea and vomiting.  Genitourinary:  Negative for dysuria, frequency and urgency.  Musculoskeletal:  Positive for joint pain.  Skin: Negative.  Negative for itching and rash.  Neurological:  Negative for dizziness, tingling, focal weakness, weakness and headaches.  Endo/Heme/Allergies:  Does not bruise/bleed easily.  Psychiatric/Behavioral:  Negative for depression. The  patient is not nervous/anxious and does not have insomnia.    MEDICAL HISTORY:  Past Medical History:  Diagnosis Date   Anemia    Anxiety    Arthritis    Gout   Cataracts, both eyes    Diabetic retinopathy ([place])    NPDR OU   Diverticulitis    GERD (gastroesophageal reflux disease)    Gout    Headache    h/o migraines   History of fracture of patella    right knee   History of positive PPD    Patient always shows positive   Hyperlipidemia    Hypertension    Hypertensive retinopathy    OU   Hypothyroidism    Lichen sclerosus 36/14/4315   of vulva   Metatarsal fracture    Neuropathy    Osteopenia    Peripheral vascular disease (HCC)    Polyneuropathy    numbness and tingling in feet and toes   Renal insufficiency    Stage 3   Sleep apnea    does not use cpap-lost weight    Type 2 diabetes mellitus, uncontrolled     SURGICAL HISTORY: Past Surgical History:  Procedure Laterality Date   ABDOMINAL HYSTERECTOMY     AMPUTATION TOE Right 05/08/2020   Procedure: AMPUTATION TOE-Right 4th Toe;  Surgeon: Caroline More, DPM;  Location: [facility];  Service: Podiatry;  Laterality: Right;   APPENDECTOMY     BREAST REDUCTION SURGERY  2001   CATARACT EXTRACTION     CESAREAN SECTION  1976   COLONOSCOPY  03/05/2013   Nml - due for repeat 03/06/2018   COLONOSCOPY WITH PROPOFOL N/A 03/18/2019   Procedure: COLONOSCOPY WITH PROPOFOL;  Surgeon: Toledo, Benay Pike, MD;  Location: [facility];  Service: Gastroenterology;  Laterality: N/A;   DIAGNOSTIC LAPAROSCOPY  DILATION AND CURETTAGE OF UTERUS  1989   ENDARTERECTOMY FEMORAL Bilateral 03/09/2018   Procedure: ENDARTERECTOMY FEMORAL;  Surgeon: Algernon Huxley, MD;  Location: [facility];  Service: Vascular;  Laterality: Bilateral;   ENDARTERECTOMY POPLITEAL Left 03/09/2018   Procedure: ENDARTERECTOMY POPLITEAL AND SFA;  Surgeon: Algernon Huxley, MD;  Location: [facility];  Service: Vascular;  Laterality: Left;   ESOPHAGOGASTRODUODENOSCOPY  03/05/2013    ESOPHAGOGASTRODUODENOSCOPY (EGD) WITH PROPOFOL N/A 03/18/2019   Procedure: ESOPHAGOGASTRODUODENOSCOPY (EGD) WITH PROPOFOL;  Surgeon: Toledo, Benay Pike, MD;  Location: [facility];  Service: Gastroenterology;  Laterality: N/A;   EYE SURGERY     Eyelid Surgery  2012   INTRAMEDULLARY (IM) NAIL INTERTROCHANTERIC Left 10/30/2015   Procedure: INTRAMEDULLARY (IM) NAIL INTERTROCHANTRIC ;  Surgeon: Hessie Knows, MD;  Location: [facility];  Service: Orthopedics;  Laterality: Left;   KYPHOPLASTY N/A 10/25/2018   Procedure: L4 KYPHOPLASTY;  Surgeon: Hessie Knows, MD;  Location: [facility];  Service: Orthopedics;  Laterality: N/A;   LAPAROSCOPIC HYSTERECTOMY  2000   total   LOWER EXTREMITY ANGIOGRAPHY Left 03/08/2017   Procedure: LOWER EXTREMITY ANGIOGRAPHY;  Surgeon: Algernon Huxley, MD;  Location: [place] [facility];  Service: Cardiovascular;  Laterality: Left;   LOWER EXTREMITY ANGIOGRAPHY Left 10/30/2017   Procedure: LOWER EXTREMITY ANGIOGRAPHY;  Surgeon: Algernon Huxley, MD;  Location: [place] [facility];  Service: Cardiovascular;  Laterality: Left;   LOWER EXTREMITY ANGIOGRAPHY Right 03/08/2018   Procedure: LOWER EXTREMITY ANGIOGRAPHY;  Surgeon: Algernon Huxley, MD;  Location: [place] [facility];  Service: Cardiovascular;  Laterality: Right;   LOWER EXTREMITY ANGIOGRAPHY Left 10/01/2018   Procedure: LOWER EXTREMITY ANGIOGRAPHY;  Surgeon: Algernon Huxley, MD;  Location: [place] [facility];  Service: Cardiovascular;  Laterality: Left;   LOWER EXTREMITY ANGIOGRAPHY Right 10/08/2018   Procedure: LOWER EXTREMITY ANGIOGRAPHY;  Surgeon: Algernon Huxley, MD;  Location: [place] [facility];  Service: Cardiovascular;  Laterality: Right;   LOWER EXTREMITY ANGIOGRAPHY Right 05/07/2020   Procedure: Lower Extremity Angiography;  Surgeon: Algernon Huxley, MD;  Location: [place] [facility];  Service: Cardiovascular;  Laterality: Right;   PERIPHERAL VASCULAR INTERVENTION  03/08/2018   Procedure: PERIPHERAL VASCULAR INTERVENTION;   Surgeon: Algernon Huxley, MD;  Location: [place] [facility];  Service: Cardiovascular;;   PORTA CATH INSERTION N/A 02/17/2020   Procedure: PORTA CATH INSERTION;  Surgeon: Algernon Huxley, MD;  Location: [place] [facility];  Service: Cardiovascular;  Laterality: N/A;   REDUCTION MAMMAPLASTY  1997   SACROPLASTY N/A 10/25/2018   Procedure: S1 SACROPLASTY;  Surgeon: Hessie Knows, MD;  Location: [facility];  Service: Orthopedics;  Laterality: N/A;    SOCIAL HISTORY: Social History   Socioeconomic History   Marital status: Married    Spouse name: [name]   Number of children: 3   Years of education: Not on file   Highest education level: Not on file  Occupational History   Occupation: Retail banker  Tobacco Use   Smoking status: Former    Packs/day: 1.00    Years: 20.00    Pack years: 20.00    Types: Cigarettes    Quit date: 03/07/1996    Years since quitting: 24.8   Smokeless tobacco: Never   Tobacco comments:    started smoking at age 41  Vaping Use   Vaping Use: Never used  Substance and Sexual Activity   Alcohol use: No    Alcohol/week: 0.0 standard drinks   Drug use: No   Sexual activity:  Yes    Partners: Male    Birth control/protection: Surgical  Other Topics Concern   Not on file  Social History Narrative   Lives in [place]; with husband; quit > 20 years; no alcohol; used to work at [employer] at [employer].    Social Determinants of Health   Financial Resource Strain: Not on file  Food Insecurity: Not on file  Transportation Needs: Not on file  Physical Activity: Not on file  Stress: Not on file  Social Connections: Not on file  Intimate Partner Violence: Not on file    FAMILY HISTORY: Family History  Problem Relation Age of Onset   Coronary artery disease Father    Heart attack Father    Coronary artery disease Mother    Heart attack Mother    Ovarian cancer Sister 40       sister had hormonal therapy for IVF txs-which increased risk factor for ovarian  cancer   Breast cancer Neg Hx     ALLERGIES:  has No Known Allergies.  MEDICATIONS:  Current Outpatient Medications  Medication Sig Dispense Refill   ALPRAZolam (XANAX) 0.25 MG tablet Take 0.25 mg by mouth daily as needed for anxiety.     amLODipine (NORVASC) 5 MG tablet Take 5 mg by mouth 2 (two) times daily.     aspirin EC 81 MG tablet Take 81 mg by mouth daily.      cholecalciferol (VITAMIN D) 1000 units tablet Take 1,000 Units by mouth 2 (two) times daily.     CORAL CALCIUM PO Take 1 tablet by mouth 2 (two) times daily.      denosumab (PROLIA) 60 MG/ML SOLN injection Inject 60 mg into the skin every 6 (six) months.      escitalopram (LEXAPRO) 10 MG tablet Take 10 mg by mouth daily.     famotidine (PEPCID) 40 MG tablet Take 40 mg by mouth daily.     furosemide (LASIX) 20 MG tablet Take 20 mg by mouth every Monday, Wednesday, and Friday.     levothyroxine (SYNTHROID, LEVOTHROID) 100 MCG tablet Take 100 mcg by mouth daily before breakfast.   3   Magnesium 500 MG TABS Take 500 mg by mouth every morning.      metFORMIN (GLUCOPHAGE) 1000 MG tablet Take 1,000 mg by mouth 2 (two) times daily with a meal.     metoprolol succinate (TOPROL-XL) 50 MG 24 hr tablet Take 50 mg by mouth daily.     mirtazapine (REMERON) 15 MG tablet Take 15 mg by mouth daily as needed (Depression).     Multiple Vitamin (MULTIVITAMIN WITH MINERALS) TABS tablet Take 1 tablet by mouth daily. Centrum Silver     olmesartan (BENICAR) 20 MG tablet Take 20 mg by mouth daily.     pantoprazole sodium (PROTONIX) 40 mg Take 40 mg by mouth every morning.      rosuvastatin (CRESTOR) 20 MG tablet Take 20 mg by mouth every morning.     TRESIBA FLEXTOUCH 200 UNIT/ML SOPN Inject 25-30 Units as directed at bedtime. Titrate according to fasting blood glucose not to exceed 50 units a day  5   vitamin B-12 (CYANOCOBALAMIN) 1000 MCG tablet Take 1,000 mcg by mouth daily.     vitamin C (ASCORBIC ACID) 250 MG tablet Take 250 mg by mouth  daily.     vitamin E 400 UNIT capsule Take 400 Units by mouth daily.     XARELTO 20 MG TABS tablet TAKE 1 TABLET BY MOUTH DAILY WITH SUPPER. [place]  tablet 3   zolpidem (AMBIEN) 10 MG tablet Take 10 mg by mouth at bedtime.     lidocaine-prilocaine (EMLA) cream Apply 1 application topically as needed (apply prior to port a cath access). (Patient not taking: Reported on 12/31/2020) 30 g 3   nystatin cream (MYCOSTATIN) Apply 1 application topically daily as needed (Yeast infection). (Patient not taking: Reported on 01/11/2021)     No current facility-administered medications for this visit.   Facility-Administered Medications Ordered in Other Visits  Medication Dose Route Frequency Provider Last Rate Last Admin   0.9 %  sodium chloride infusion   Intravenous Once Charlaine Dalton R, MD       heparin lock flush 100 unit/mL  500 Units Intracatheter Once PRN Cammie Sickle, MD       iron sucrose (VENOFER) injection 200 mg  200 mg Intravenous Once Cammie Sickle, MD          PHYSICAL EXAMINATION:   Vitals:   01/11/21 1333  BP: 134/65  Pulse: 78  Resp: 18  Temp: 98.7 F (37.1 C)   Filed Weights   01/11/21 1333  Weight: 166 lb (75.3 kg)    Physical Exam Constitutional:      Comments: Alone.  Ambulating independently.  HENT:     Head: Normocephalic and atraumatic.     Mouth/Throat:     Pharynx: No oropharyngeal exudate.  Eyes:     Pupils: Pupils are equal, round, and reactive to light.  Cardiovascular:     Rate and Rhythm: Normal rate and regular rhythm.  Pulmonary:     Effort: Pulmonary effort is normal. No respiratory distress.     Breath sounds: Normal breath sounds. No wheezing.  Abdominal:     General: Bowel sounds are normal. There is no distension.     Palpations: Abdomen is soft. There is no mass.     Tenderness: There is no abdominal tenderness. There is no guarding or rebound.  Musculoskeletal:        General: No tenderness. Normal range of motion.      Cervical back: Normal range of motion and neck supple.  Skin:    General: Skin is warm.  Neurological:     Mental Status: She is alert and oriented to person, place, and time.  Psychiatric:        Mood and Affect: Affect normal.    LABORATORY DATA:  I have reviewed the data as listed Lab Results  Component Value Date   WBC 6.3 01/11/2021   HGB 9.5 (L) 01/11/2021   HCT 29.0 (L) 01/11/2021   MCV 88.7 01/11/2021   PLT 265 01/11/2021   Recent Labs    08/11/20 0130 08/12/20 0409 10/16/20 1329 01/11/21 1317  NA 135 141 133* 134*  K 5.0 4.9 5.1 4.6  CL 106 110 105 102  CO2 25 23 21* 24  GLUCOSE 99 102* 195* 279*  BUN 33* 23 38* 34*  CREATININE 2.15* 1.74* 1.94* 1.33*  CALCIUM 8.8* 8.9 9.2 9.7  GFRNONAA 23* 30* 27* 42*  PROT 6.8  --  6.8 7.1  ALBUMIN 3.4*  --  3.6 3.7  AST 48* 50* 22 20  ALT 30  --  19 18  ALKPHOS 45  --  46 63  BILITOT 0.7  --  0.6 0.6  BILIDIR 0.1  --   --   --   IBILI 0.6  --   --   --      Intravitreal Injection, Pharmacologic Agent -  OD - Right Eye  Result Date: 12/18/2020 Time Out 12/18/2020. 2:45 PM. Confirmed correct patient, procedure, site, and patient consented. Anesthesia Topical anesthesia was used. Anesthetic medications included Lidocaine 2%, Proparacaine 0.5%. Procedure Preparation included 5% betadine to ocular surface, eyelid speculum. Injection: 2 mg aflibercept 2 MG/0.05ML   Route: Intravitreal, Site: Right Eye   NDC: [id number], Lot: [id number], Expiration date: 10/03/2021, Waste: 0.05 mL Post-op Post injection exam found visual acuity of at least counting fingers. The patient tolerated the procedure well. There were no complications. The patient received written and verbal post procedure care education. Post injection medications were not given.   OCT, Retina - OU - Both Eyes  Result Date: 12/18/2020 Right Eye Quality was good. Central Foveal Thickness: 335. Progression has been stable. Findings include abnormal foveal contour,  epiretinal membrane, no SRF, intraretinal fluid (Trace cystic changes IN fovea). Left Eye Quality was good. Central Foveal Thickness: 283. Progression has been stable. Findings include normal foveal contour, no IRF, no SRF (Trace ERM). Notes *Images captured and stored on drive Diagnosis / Impression: OD: Trace cystic changes IN fovea OS: NFP; no IRF/SRF--stable, trace ERM Clinical management: See below Abbreviations: NFP - Normal foveal profile. CME - cystoid macular edema. PED - pigment epithelial detachment. IRF - intraretinal fluid. SRF - subretinal fluid. EZ - ellipsoid zone. ERM - epiretinal membrane. ORA - outer retinal atrophy. ORT - outer retinal tubulation. 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  Improved s/p IV iron infusions however today- Hb 9.5. Proceed with Venofer today.    # FEB 2022- Iron saturation 15 %; ferritin 19--anemia secondary to CKD.  Proceed with iron infusions as planned.  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   # Etiology-CKD-III; GFR ~40 overall -stable [Dr.Kolluru]; recommend continued hydration.  #Poor IV access/Mediport placement-stable  # DISPOSITION:  # Venofer today;  # in 1 weeks from now- # follow up 3 months- MD; port; labs- cbc/bmp/iron studies/ferritin-;possible venofer- Dr.B   All questions were answered. The patient knows to call the clinic with any problems, questions or concerns.    Cammie Sickle, MD 01/11/2021 2:11 PM

## 2021-01-11 NOTE — Progress Notes (Signed)
Patient is scheduled for surgery on 1/23 with Dr. Claudine Mouton.

## 2021-01-11 NOTE — Progress Notes (Unsigned)
C-

## 2021-01-11 NOTE — Telephone Encounter (Signed)
LVM at [facility] (Dr. Lucky Cowboy) at [phone] to follow up on the Lovenox bridge. Need to know if that is something they order?

## 2021-01-11 NOTE — Patient Instructions (Addendum)
Your procedure is scheduled on: Monday, January 23 Report to the Registration Desk on the 1st floor of the [facility] To find out your arrival time, please call [phone] between 1PM - 3PM on: Friday, January 20  REMEMBER: Instructions that are not followed completely may result in serious medical risk, up to and including death; or upon the discretion of your surgeon and anesthesiologist your surgery may need to be rescheduled.  Do not eat food after midnight the night before surgery.  No gum chewing, lozengers or hard candies.  You may however, drink water up to 2 hours before you are scheduled to arrive for your surgery. Do not drink anything within 2 hours of your scheduled arrival time.  TAKE THESE MEDICATIONS THE MORNING OF SURGERY WITH A SIP OF WATER:  Amlodipine Escitalopram (lexapro) Famotidine (pepcid) Levothyroxine Metoprolol Pantoprazole (protonix) Rosuvastatin (crestor)  Stop metformin 2 days prior to surgery. Last day to take Metformin is Friday, January 20. Resume AFTER surgery.  Only take 1/2 of your tresiba insulin dose the night before surgery.  Follow recommendations from Cardiologist, Pulmonologist or PCP regarding stopping aspirin and Xarelto. Dr. Claudine Mouton will discuss Xarelto/Lovenox at appointment on January 17.  One week prior to surgery: starting January 16 Stop Anti-inflammatories (NSAIDS) such as Advil, Aleve, Ibuprofen, Motrin, Naproxen, Naprosyn and Aspirin based products such as Excedrin, Goodys Powder, BC Powder. Stop ANY OVER THE COUNTER supplements until after surgery. Stop vitamin D, calcium, magnesium, multiple vitamin, vitamin C, vitamin E You may however, continue to take Tylenol if needed for pain up until the day of surgery.  No Alcohol for 24 hours before or after surgery.  No Smoking including e-cigarettes for 24 hours prior to surgery.  No chewable tobacco products for at least 6 hours prior to surgery.  No nicotine patches on  the day of surgery.  Do not use any "recreational" drugs for at least a week prior to your surgery.  Please be advised that the combination of cocaine and anesthesia may have negative outcomes, up to and including death. If you test positive for cocaine, your surgery will be cancelled.  On the morning of surgery brush your teeth with toothpaste and water, you may rinse your mouth with mouthwash if you wish. Do not swallow any toothpaste or mouthwash.  Use CHG Soap or wipes as directed on instruction sheet.  Do not wear jewelry, make-up, hairpins, clips or nail polish.  Do not wear lotions, powders, or perfumes.   Do not shave body from the neck down 48 hours prior to surgery just in case you cut yourself which could leave a site for infection.  Also, freshly shaved skin may become irritated if using the CHG soap.  Contact lenses, hearing aids and dentures may not be worn into surgery.  Do not bring valuables to the hospital. [facility] is not responsible for any missing/lost belongings or valuables.   Bowel prep as directed by your surgeon.  Notify your doctor if there is any change in your medical condition (cold, fever, infection).  Wear comfortable clothing (specific to your surgery type) to the hospital.  After surgery, you can help prevent lung complications by doing breathing exercises.  Take deep breaths and cough every 1-2 hours. Your doctor may order a device called an Incentive Spirometer to help you take deep breaths. When coughing or sneezing, hold a pillow firmly against your incision with both hands. This is called splinting. Doing this helps protect your incision. It also decreases belly  discomfort.  If you are being admitted to the hospital overnight, leave your suitcase in the car. After surgery it may be brought to your room.  If you are being discharged the day of surgery, you will not be allowed to drive home. You will need a responsible adult (18 years or  older) to drive you home and stay with you that night.   If you are taking public transportation, you will need to have a responsible adult (18 years or older) with you. Please confirm with your physician that it is acceptable to use public transportation.   Please call the Pre-admissions Testing Dept. at [phone] if you have any questions about these instructions.  Surgery Visitation Policy:  Patients undergoing a surgery or procedure may have one family member or support person with them as long as that person is not COVID-19 positive or experiencing its symptoms.  That person may remain in the waiting area during the procedure and may rotate out with other people.  Inpatient Visitation:    Visiting hours are 7 a.m. to 8 p.m. Up to two visitors ages 16+ are allowed at one time in a patient room. The visitors may rotate out with other people during the day. Visitors must check out when they leave, or other visitors will not be allowed. One designated support person may remain overnight. The visitor must pass COVID-19 screenings, use hand sanitizer when entering and exiting the patients room and wear a mask at all times, including in the patients room. Patients must also wear a mask when staff or their visitor are in the room. Masking is required regardless of vaccination status.

## 2021-01-11 NOTE — Pre-Procedure Instructions (Signed)
Blood work to be obtained at the [facility] today; after contacting Dr. Donneta Romberg, agreed to add on hemoglobin A1c to complete pre-surgical labs at one time. Will do type and screen lab work on the day of surgery due to being 14 days out. EKG obtained today.  According to Dr. Driscilla Grammes clearance form, patient to have lovenox bridge.  Per secure chat with Dr. Claudine Mouton; will discuss lovenox bridge with patient at 01/19/21 office visit. Patient instructed on all of above, acknowledged understanding.

## 2021-01-12 NOTE — Progress Notes (Signed)
Lab work reviewed; anemia slightly improved from previous. Received iron infusion yesterday and is scheduled to receive another iron infusion on 01/18/21. Hgb A1c 7.3; elevated from 2 months ago. Labs faxed to Dr. Claudine Mouton and Dr. Tedd Sias (endocrinologist) as an [place] [facility]. Surgery scheduled for 01/25/21.

## 2021-01-14 NOTE — Progress Notes (Signed)
[facility] Note  01/15/2021     CHIEF COMPLAINT Patient presents for Retina Follow Up   HISTORY OF PRESENT ILLNESS: Cheryl Leonard is a 76 y.o. female who presents to the clinic today for:   HPI     Retina Follow Up   Patient presents with  CRVO/BRVO.  In right eye.  This started years ago.  Severity is moderate.  Duration of 4 weeks.  Since onset it is stable.  I, the attending physician,  performed the HPI with the patient and updated documentation appropriately.        Comments   76 y/o female pt here for 4 wk f/u for BRVO w/CME OD.  No change in [place] OU.  Denies pain, FOL, floaters.  No gtts.  BS 89 today.  A1C 6.5.      Last edited by Bernarda Caffey, MD on 01/15/2021  1:36 PM.    Pt recently saw Dr. Marvel Plan, who said he is very pleased with her results, she states vision is stable  Referring physician: [address] [place],  [place] [address]  HISTORICAL INFORMATION:   Selected notes from the MEDICAL RECORD NUMBER Referred by Dr. Marvel Plan for concern of DME OD Lab Results  Component Value Date   HGBA1C 7.3 (H) 01/11/2021       CURRENT MEDICATIONS: No current outpatient medications on file. (Ophthalmic Drugs)   No current facility-administered medications for this visit. (Ophthalmic Drugs)   Current Outpatient Medications (Other)  Medication Sig   ALPRAZolam (XANAX) 0.25 MG tablet Take 0.25 mg by mouth daily as needed for anxiety.   amLODipine (NORVASC) 5 MG tablet Take 5 mg by mouth 2 (two) times daily.   aspirin EC 81 MG tablet Take 81 mg by mouth daily.    cholecalciferol (VITAMIN D) 1000 units tablet Take 1,000 Units by mouth 2 (two) times daily.   CORAL CALCIUM PO Take 1 tablet by mouth 2 (two) times daily.    denosumab (PROLIA) 60 MG/ML SOLN injection Inject 60 mg into the skin every 6 (six) months.    escitalopram (LEXAPRO) 10 MG tablet Take 10 mg by mouth daily.   famotidine (PEPCID) 40 MG  tablet Take 40 mg by mouth daily.   furosemide (LASIX) 20 MG tablet Take 20 mg by mouth every Monday, Wednesday, and Friday.   levothyroxine (SYNTHROID, LEVOTHROID) 100 MCG tablet Take 100 mcg by mouth daily before breakfast.    lidocaine-prilocaine (EMLA) cream Apply 1 application topically as needed (apply prior to port a cath access). (Patient not taking: Reported on 12/31/2020)   Magnesium 500 MG TABS Take 500 mg by mouth every morning.    metFORMIN (GLUCOPHAGE) 1000 MG tablet Take 1,000 mg by mouth 2 (two) times daily with a meal.   metoprolol succinate (TOPROL-XL) 50 MG 24 hr tablet Take 50 mg by mouth daily.   mirtazapine (REMERON) 15 MG tablet Take 15 mg by mouth daily as needed (Depression).   Multiple Vitamin (MULTIVITAMIN WITH MINERALS) TABS tablet Take 1 tablet by mouth daily. Centrum Silver   nystatin cream (MYCOSTATIN) Apply 1 application topically daily as needed (Yeast infection). (Patient not taking: Reported on 01/11/2021)   olmesartan (BENICAR) 20 MG tablet Take 20 mg by mouth daily.   pantoprazole sodium (PROTONIX) 40 mg Take 40 mg by mouth every morning.    rosuvastatin (CRESTOR) 20 MG tablet Take 20 mg by mouth every morning.   TRESIBA FLEXTOUCH 200 UNIT/ML SOPN Inject 25-30  Units as directed at bedtime. Titrate according to fasting blood glucose not to exceed 50 units a day   vitamin B-12 (CYANOCOBALAMIN) 1000 MCG tablet Take 1,000 mcg by mouth daily.   vitamin C (ASCORBIC ACID) 250 MG tablet Take 250 mg by mouth daily.   vitamin E 400 UNIT capsule Take 400 Units by mouth daily.   XARELTO 20 MG TABS tablet TAKE 1 TABLET BY MOUTH DAILY WITH SUPPER.   zolpidem (AMBIEN) 10 MG tablet Take 10 mg by mouth at bedtime.   No current facility-administered medications for this visit. (Other)   REVIEW OF SYSTEMS: ROS   Positive for: Gastrointestinal, Musculoskeletal, Endocrine, Cardiovascular, Eyes, Respiratory Negative for: Constitutional, Neurological, Skin, Genitourinary, HENT,  Psychiatric, Allergic/Imm, Heme/Lymph Last edited by Matthew Folks, COA on 01/15/2021 12:59 PM.     ALLERGIES No Known Allergies  PAST MEDICAL HISTORY Past Medical History:  Diagnosis Date   Anemia    Anxiety    Arthritis    Gout   Cataracts, both eyes    Diabetic retinopathy ([place])    NPDR OU   Diverticulitis    GERD (gastroesophageal reflux disease)    Gout    Headache    h/o migraines   History of fracture of patella    right knee   History of positive PPD    Patient always shows positive   Hyperlipidemia    Hypertension    Hypertensive retinopathy    OU   Hypothyroidism    Lichen sclerosus 79/89/2119   of vulva   Metatarsal fracture    Neuropathy    Osteopenia    Peripheral vascular disease (HCC)    Polyneuropathy    numbness and tingling in feet and toes   Renal insufficiency    Stage 3   Sleep apnea    does not use cpap-lost weight    Type 2 diabetes mellitus, uncontrolled    Past Surgical History:  Procedure Laterality Date   ABDOMINAL HYSTERECTOMY     AMPUTATION TOE Right 05/08/2020   Procedure: AMPUTATION TOE-Right 4th Toe;  Surgeon: Caroline More, DPM;  Location: [facility];  Service: Podiatry;  Laterality: Right;   APPENDECTOMY     BREAST REDUCTION SURGERY  2001   CATARACT EXTRACTION     CESAREAN SECTION  1976   COLONOSCOPY  03/05/2013   Nml - due for repeat 03/06/2018   COLONOSCOPY WITH PROPOFOL N/A 03/18/2019   Procedure: COLONOSCOPY WITH PROPOFOL;  Surgeon: Toledo, Benay Pike, MD;  Location: [facility];  Service: Gastroenterology;  Laterality: N/A;   DIAGNOSTIC LAPAROSCOPY     DILATION AND CURETTAGE OF UTERUS  1989   ENDARTERECTOMY FEMORAL Bilateral 03/09/2018   Procedure: ENDARTERECTOMY FEMORAL;  Surgeon: Algernon Huxley, MD;  Location: [facility];  Service: Vascular;  Laterality: Bilateral;   ENDARTERECTOMY POPLITEAL Left 03/09/2018   Procedure: ENDARTERECTOMY POPLITEAL AND SFA;  Surgeon: Algernon Huxley, MD;  Location: [facility];  Service: Vascular;   Laterality: Left;   ESOPHAGOGASTRODUODENOSCOPY  03/05/2013   ESOPHAGOGASTRODUODENOSCOPY (EGD) WITH PROPOFOL N/A 03/18/2019   Procedure: ESOPHAGOGASTRODUODENOSCOPY (EGD) WITH PROPOFOL;  Surgeon: Toledo, Benay Pike, MD;  Location: [facility];  Service: Gastroenterology;  Laterality: N/A;   EYE SURGERY     Eyelid Surgery  2012   INTRAMEDULLARY (IM) NAIL INTERTROCHANTERIC Left 10/30/2015   Procedure: INTRAMEDULLARY (IM) NAIL INTERTROCHANTRIC ;  Surgeon: Hessie Knows, MD;  Location: [facility];  Service: Orthopedics;  Laterality: Left;   KYPHOPLASTY N/A 10/25/2018   Procedure: L4 KYPHOPLASTY;  Surgeon: Hessie Knows,  MD;  Location: [facility];  Service: Orthopedics;  Laterality: N/A;   LAPAROSCOPIC HYSTERECTOMY  2000   total   LOWER EXTREMITY ANGIOGRAPHY Left 03/08/2017   Procedure: LOWER EXTREMITY ANGIOGRAPHY;  Surgeon: Algernon Huxley, MD;  Location: [place] [facility];  Service: Cardiovascular;  Laterality: Left;   LOWER EXTREMITY ANGIOGRAPHY Left 10/30/2017   Procedure: LOWER EXTREMITY ANGIOGRAPHY;  Surgeon: Algernon Huxley, MD;  Location: [place] [facility];  Service: Cardiovascular;  Laterality: Left;   LOWER EXTREMITY ANGIOGRAPHY Right 03/08/2018   Procedure: LOWER EXTREMITY ANGIOGRAPHY;  Surgeon: Algernon Huxley, MD;  Location: [place] [facility];  Service: Cardiovascular;  Laterality: Right;   LOWER EXTREMITY ANGIOGRAPHY Left 10/01/2018   Procedure: LOWER EXTREMITY ANGIOGRAPHY;  Surgeon: Algernon Huxley, MD;  Location: [place] [facility];  Service: Cardiovascular;  Laterality: Left;   LOWER EXTREMITY ANGIOGRAPHY Right 10/08/2018   Procedure: LOWER EXTREMITY ANGIOGRAPHY;  Surgeon: Algernon Huxley, MD;  Location: [place] [facility];  Service: Cardiovascular;  Laterality: Right;   LOWER EXTREMITY ANGIOGRAPHY Right 05/07/2020   Procedure: Lower Extremity Angiography;  Surgeon: Algernon Huxley, MD;  Location: [place] [facility];  Service: Cardiovascular;  Laterality: Right;   PERIPHERAL VASCULAR INTERVENTION   03/08/2018   Procedure: PERIPHERAL VASCULAR INTERVENTION;  Surgeon: Algernon Huxley, MD;  Location: [place] [facility];  Service: Cardiovascular;;   PORTA CATH INSERTION N/A 02/17/2020   Procedure: PORTA CATH INSERTION;  Surgeon: Algernon Huxley, MD;  Location: [place] [facility];  Service: Cardiovascular;  Laterality: N/A;   REDUCTION MAMMAPLASTY  1997   SACROPLASTY N/A 10/25/2018   Procedure: S1 SACROPLASTY;  Surgeon: Hessie Knows, MD;  Location: [facility];  Service: Orthopedics;  Laterality: N/A;   FAMILY HISTORY Family History  Problem Relation Age of Onset   Coronary artery disease Father    Heart attack Father    Coronary artery disease Mother    Heart attack Mother    Ovarian cancer Sister 60       sister had hormonal therapy for IVF txs-which increased risk factor for ovarian cancer   Breast cancer Neg Hx    SOCIAL HISTORY Social History   Tobacco Use   Smoking status: Former    Packs/day: 1.00    Years: 20.00    Pack years: 20.00    Types: Cigarettes    Quit date: 03/07/1996    Years since quitting: 24.8   Smokeless tobacco: Never   Tobacco comments:    started smoking at age 60  Vaping Use   Vaping Use: Never used  Substance Use Topics   Alcohol use: No    Alcohol/week: 0.0 standard drinks   Drug use: No       OPHTHALMIC EXAM: Base Eye Exam     Visual Acuity (Snellen - Linear)       Right Left   Dist [place] 20/25 -2 20/20   Dist ph [place] NI          Tonometry (Tonopen, 1:01 PM)       Right Left   Pressure 10 11         Pupils       Dark Light Shape React APD   Right 3 2 Round Minimal None   Left 3 2 Round Minimal None         Visual Fields (Counting fingers)       Left Right    Full Full         Extraocular  Movement       Right Left    Full, Ortho Full, Ortho         Neuro/Psych     Oriented x3: Yes   Mood/Affect: Normal         Dilation     Both eyes: 1.0% Mydriacyl, 2.5% Phenylephrine @ 1:01 PM           Slit Lamp  and Fundus Exam     External Exam       Right Left   External Normal Normal         Slit Lamp Exam       Right Left   Lids/Lashes dermatochalasis dermatochalasis   Conjunctiva/Sclera White and quiet White and quiet   Cornea arcus; well healed cataract wound; 2-3+ diffuse Punctate epithelial erosions, decreased TBUT, mild Anterior basement membrane dystrophy superiorly arcus; well healed cataract wound, 2-3+ diffuse Punctate epithelial erosions, irregualr epi surface, decreased TBUT   Anterior Chamber Deep and quiet Deep and quiet   Iris Round and dilated Round and dilated   Lens PCIOL; open PC PCIOL; open PC   Anterior Vitreous syneresis, Posterior vitreous detachment, vitreous condensations inferiorly syneresis, Posterior vitreous detachment         Fundus Exam       Right Left   Disc Superior hyperemia and mild edema - improved, mild Pallor Pink and Sharp   C/D Ratio 0.6 0.5   Macula Flat, Blunted foveal reflex, trace cystic changes - slightly improved, +Epiretinal membrane, focal DBH ST mac - improved, +MA flat; good foveal reflex, no heme or edema, small pigment clump IT to fovea   Vessels attenuated, Tortuous Vascular attenuation   Periphery Attached; focal dot heme temporal periphery Attached, no heme           IMAGING AND PROCEDURES  Imaging and Procedures for 04/25/17  OCT, Retina - OU - Both Eyes       Right Eye Quality was good. Central Foveal Thickness: 335. Progression has improved. Findings include abnormal foveal contour, epiretinal membrane, no SRF, intraretinal fluid (Interval improvement in cystic changes).   Left Eye Quality was good. Central Foveal Thickness: 278. Progression has been stable. Findings include normal foveal contour, no IRF, no SRF (Trace ERM).   Notes *Images captured and stored on drive  Diagnosis / Impression:  OD: Interval improvement in cystic changes OS: NFP; no IRF/SRF--stable, trace ERM  Clinical management:  See  below  Abbreviations: NFP - Normal foveal profile. CME - cystoid macular edema. PED - pigment epithelial detachment. IRF - intraretinal fluid. SRF - subretinal fluid. EZ - ellipsoid zone. ERM - epiretinal membrane. ORA - outer retinal atrophy. ORT - outer retinal tubulation. SRHM - subretinal hyper-reflective material      Intravitreal Injection, Pharmacologic Agent - OD - Right Eye       Time Out 01/15/2021. 1:18 PM. Confirmed correct patient, procedure, site, and patient consented.   Anesthesia Topical anesthesia was used. Anesthetic medications included Lidocaine 2%, Proparacaine 0.5%.   Procedure Preparation included 5% betadine to ocular surface, eyelid speculum. A (32g) needle was used.   Injection: 2 mg aflibercept 2 MG/0.05ML   Route: Intravitreal, Site: Right Eye   NDC: [id number], Lot: [id number], Expiration date: 12/02/2021, Waste: 0.05 mL   Post-op Post injection exam found visual acuity of at least counting fingers. The patient tolerated the procedure well. There were no complications. The patient received written and verbal post procedure care education. Post injection medications were not given.  ASSESSMENT/PLAN:    ICD-10-CM   1. 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 Both eyes affected by mild nonproliferative diabetic retinopathy with macular edema, associated with type 2 diabetes mellitus ([place])  O70.9628     3. Essential hypertension  I10     4. Hypertensive retinopathy of both eyes  H35.033     5. Epiretinal membrane (ERM) of right eye  H35.371     6. Pseudophakia of both eyes  Z96.1      1. BRVO w/ CME OD  - by history, pt states symptoms first noticed 2 wks prior to presentation, but reports changes may have occurred prior  - initial exam with differential tortuosity  of vessels (OD > OS)  - FA (02.10.20) shows mild late staining / leakage in macula, staining / leakage of disc -- improving CME  - differential includes DM2 (DME), hypertensive retinopathy, inflammatory etiology / uveitis  - S/P IVA OD #1 (02.08.19), #2 (03.11.19), #3 (04.09.19), #4 (05.20.19), #5 (02.10.20)  - gave IVA OD on 2.10.20 due to pending Eylea4U for 2020 -- resulted in increased IRF/CME  - review of OCTs show persistent IRF and cystic changes --  resistance to IVA   - June 2019 -- switched therapies: S/P IVE OD #1 (06.24.19), #2 (07.24.19), #3 (09.04.19), #4 (10.30.19),#5 (12.30.19), #6 (03.23.20), #7 (05.05.20), #8 (07.16.20), #9 (07.17.20), #10 (08.28.20), #11 (10.13.20), # 12 (11.17.20), #13 (2.8.21), #14 (03.09.21), #15 (04.13.21), #16 (05.11.21), #17 (06.17.21), #18 (07.23.21), #19 (08.30.21), #20 (10.04.21), #21 (11.08.21), #22 (12.08.21), #23 (01.31.22), #24 (02.28.22), #25 (04.01.22), #26 (06.15.22), #27 (07.13.22), #28 (08.17.22), #29 (09.21.22), #30 (10.19.22), #31 (11.16.22), #32 (12.16.22)  - OCT today shows interval improvement in cystic changes  - BCVA 20/30 -- stable  - Eylea4U benefits investigation completed and pt approved for IVE for 2022  - recommend IVE OD #33 today, 01.13.23 w/ f/u in 4 wks  - RBA of procedure discussed, questions answered  - informed consent obtained  - see procedure note  - Eylea informed consent form obtained and scanned on 11.19.2020  - f/u 4 weeks  -- DFE/OCT/possible injection  2. Mild nonproliferative diabetic retinopathy, both eyes  - A1c 6.0 on 10.14.22  - could be contributing to CME OD  - OS with minimal diabetic retinopathy  - continue to monitor   3,4. Hypertensive retinopathy OU - stable  - as above, may have contributing to CME OD  - discussed importance of tight BP control  - monitor   5. Epiretinal membrane, right eye   - stable nasal ERM  - no indication for surgery at this time  6. Pseudophakia OU  - s/p CE/IOL OU  by cataract surgeon in [facility]  - doing well  - monitor   Ophthalmic Meds Ordered this visit:  Meds ordered this encounter  Medications   aflibercept (EYLEA) SOLN 2 mg     Return for f/u 4-5 weeks, BRVO OD, DFE, OCT.  This document serves as a record of services personally performed by Gardiner Sleeper, MD, PhD. It was created on their behalf by Leonie Douglas, an ophthalmic technician. The creation of this record is the provider's dictation and/or activities during the visit.    Electronically signed by: Leonie Douglas COA, 01/15/21  1:43 PM  This document serves as a record  of services personally performed by Gardiner Sleeper, MD, PhD. It was created on their behalf by San Jetty. Owens Shark, OA an ophthalmic technician. The creation of this record is the provider's dictation and/or activities during the visit.    Electronically signed by: San Jetty. Owens Shark, [place] 01.13.2023 1:43 PM  Gardiner Sleeper, M.D., Ph.D. Diseases & Surgery of the Retina and [place] 01/15/2021  I have reviewed the above documentation for accuracy and completeness, and I agree with the above. Gardiner Sleeper, M.D., Ph.D. 01/15/21 1:43 PM  Abbreviations: M myopia (nearsighted); A astigmatism; H hyperopia (farsighted); P presbyopia; Mrx spectacle prescription;  CTL contact lenses; OD right eye; OS left eye; OU both eyes  XT exotropia; ET esotropia; PEK punctate epithelial keratitis; PEE punctate epithelial erosions; DES dry eye syndrome; MGD meibomian gland dysfunction; ATs artificial tears; PFAT's preservative free artificial tears; [place] nuclear sclerotic cataract; PSC posterior subcapsular cataract; ERM epi-retinal membrane; PVD posterior vitreous detachment; RD retinal detachment; DM diabetes mellitus; DR diabetic retinopathy; NPDR non-proliferative diabetic retinopathy; PDR proliferative diabetic retinopathy; CSME clinically significant macular edema; DME diabetic macular edema; dbh dot blot  hemorrhages; CWS cotton wool spot; POAG primary open angle glaucoma; C/D cup-to-disc ratio; HVF humphrey visual field; GVF goldmann visual field; OCT optical coherence tomography; IOP intraocular pressure; BRVO Branch retinal vein occlusion; CRVO central retinal vein occlusion; CRAO central retinal artery occlusion; BRAO branch retinal artery occlusion; RT retinal tear; SB scleral buckle; PPV pars plana vitrectomy; VH Vitreous hemorrhage; PRP panretinal laser photocoagulation; IVK intravitreal kenalog; VMT vitreomacular traction; MH Macular hole;  NVD neovascularization of the disc; NVE neovascularization elsewhere; AREDS age related eye disease study; ARMD age related macular degeneration; POAG primary open angle glaucoma; EBMD epithelial/anterior basement membrane dystrophy; ACIOL anterior chamber intraocular lens; IOL intraocular lens; PCIOL posterior chamber intraocular lens; Phaco/IOL phacoemulsification with intraocular lens placement; [place] photorefractive keratectomy; LASIK laser assisted in situ keratomileusis; HTN hypertension; DM diabetes mellitus; COPD chronic obstructive pulmonary disease

## 2021-01-15 ENCOUNTER — Encounter (INDEPENDENT_AMBULATORY_CARE_PROVIDER_SITE_OTHER): Payer: Self-pay | Admitting: Ophthalmology

## 2021-01-15 ENCOUNTER — Other Ambulatory Visit: Payer: Self-pay

## 2021-01-15 ENCOUNTER — Ambulatory Visit (INDEPENDENT_AMBULATORY_CARE_PROVIDER_SITE_OTHER): Payer: Medicare Other | Admitting: Ophthalmology

## 2021-01-15 DIAGNOSIS — Z961 Presence of intraocular lens: Secondary | ICD-10-CM

## 2021-01-15 DIAGNOSIS — H35033 Hypertensive retinopathy, bilateral: Secondary | ICD-10-CM | POA: Diagnosis not present

## 2021-01-15 DIAGNOSIS — H34831 Tributary (branch) retinal vein occlusion, right eye, with macular edema: Secondary | ICD-10-CM | POA: Diagnosis not present

## 2021-01-15 DIAGNOSIS — I10 Essential (primary) hypertension: Secondary | ICD-10-CM | POA: Diagnosis not present

## 2021-01-15 DIAGNOSIS — E113213 Type 2 diabetes mellitus with mild nonproliferative diabetic retinopathy with macular edema, bilateral: Secondary | ICD-10-CM

## 2021-01-15 DIAGNOSIS — H35371 Puckering of macula, right eye: Secondary | ICD-10-CM

## 2021-01-15 MED ORDER — AFLIBERCEPT 2MG/0.05ML IZ SOLN FOR KALEIDOSCOPE
2.0000 mg | INTRAVITREAL | Status: AC | PRN
  Administered 2021-01-15: 2 mg via INTRAVITREAL

## 2021-01-18 ENCOUNTER — Inpatient Hospital Stay: Payer: Medicare Other

## 2021-01-18 ENCOUNTER — Other Ambulatory Visit: Payer: Self-pay

## 2021-01-18 VITALS — BP 118/50 | HR 70 | Temp 96.7°F | Resp 18

## 2021-01-18 DIAGNOSIS — D631 Anemia in chronic kidney disease: Secondary | ICD-10-CM

## 2021-01-18 DIAGNOSIS — D649 Anemia, unspecified: Secondary | ICD-10-CM | POA: Diagnosis not present

## 2021-01-18 MED ORDER — HEPARIN SOD (PORK) LOCK FLUSH 100 UNIT/ML IV SOLN
500.0000 [IU] | Freq: Once | INTRAVENOUS | Status: AC | PRN
  Filled 2021-01-18: qty 5

## 2021-01-18 MED ORDER — SODIUM CHLORIDE 0.9 % IV SOLN
Freq: Once | INTRAVENOUS | Status: AC
  Filled 2021-01-18: qty 250

## 2021-01-18 MED ORDER — IRON SUCROSE 20 MG/ML IV SOLN
200.0000 mg | Freq: Once | INTRAVENOUS | Status: AC
  Administered 2021-01-18: 200 mg via INTRAVENOUS
  Filled 2021-01-18: qty 10

## 2021-01-18 MED ORDER — HEPARIN SOD (PORK) LOCK FLUSH 100 UNIT/ML IV SOLN
INTRAVENOUS | Status: AC
  Administered 2021-01-18: 500 [IU]
  Filled 2021-01-18: qty 5

## 2021-01-18 NOTE — Patient Instructions (Signed)

## 2021-01-19 ENCOUNTER — Encounter: Payer: Self-pay | Admitting: Surgery

## 2021-01-19 ENCOUNTER — Ambulatory Visit (INDEPENDENT_AMBULATORY_CARE_PROVIDER_SITE_OTHER): Payer: Medicare Other | Admitting: Surgery

## 2021-01-19 VITALS — BP 123/57 | HR 86 | Temp 98.5°F | Ht 65.0 in | Wt 165.6 lb

## 2021-01-19 DIAGNOSIS — N321 Vesicointestinal fistula: Secondary | ICD-10-CM | POA: Diagnosis not present

## 2021-01-19 MED ORDER — METRONIDAZOLE 500 MG PO TABS: ORAL_TABLET | ORAL | 0 refills | Status: DC

## 2021-01-19 MED ORDER — POLYETHYLENE GLYCOL 3350 17 GM/SCOOP PO POWD: ORAL | 0 refills | Status: DC

## 2021-01-19 MED ORDER — ERYTHROMYCIN BASE 500 MG PO TABS: 500.0000 mg | ORAL_TABLET | Freq: Four times a day (QID) | ORAL | 0 refills | Status: DC

## 2021-01-19 NOTE — H&P (View-Only) (Signed)
Patient ID: Cheryl Leonard, female   DOB: [birth year], 76 y.o.   MRN: [id number]  Chief Complaint: Probable colovesical fistula  History of Present Illness   Follow up visit for surgical prep.  Previously:  Cheryl Leonard is a 76 y.o. female with history of urosepsis about a year ago, nearly died she reports.   Has had subsequent recurrent E. coli UTIs roughly about every 2 weeks.  She reports some intermittency to her stream, with associated frequency and dysuria.  She has no known history of diverticulitis.  Had a colonoscopy in 2021 which showed just a few diverticula.  CT scans x2 have shown foci of air in the bladder, with some adjacent thickening of the sigmoid colon.  No obvious or clear-cut fistula identified on CT imaging.  She is currently finished a recent course of antibiotics, and wants to pursue her plans over Christmas and new year.  At her request she desires to pursue surgery after the third week of January.  She will be with her daughter who has been useful in her care.  She currently takes Xarelto for her peripheral vascular disease.   History recurrent UTI and overactive bladder CT March 2022 with foci of gas in bladder and thickened sigmoid colon Cystoscopy 09/2020 with with inflamed bladder mucosa with bullous edema anterior bladder wall Follow-up CT 12/02/2020 with persistent gas in bladder Presently with complaints of increased frequency, urgency and dysuria Colonoscopy 03/2019 with a few, small-mouth diverticula in the sigmoid colon Past Medical History Past Medical History:  Diagnosis Date   Anemia    Anxiety    Arthritis    Gout   Cataracts, both eyes    Diabetic retinopathy ([place])    NPDR OU   Diverticulitis    GERD (gastroesophageal reflux disease)    Gout    Headache    h/o migraines   History of fracture of patella    right knee   History of positive PPD    Patient always shows positive   Hyperlipidemia    Hypertension    Hypertensive retinopathy     OU   Hypothyroidism    Lichen sclerosus [id number]   of vulva   Metatarsal fracture    Neuropathy    Osteopenia    Peripheral vascular disease (HCC)    Polyneuropathy    numbness and tingling in feet and toes   Renal insufficiency    Stage 3   Sleep apnea    does not use cpap-lost weight    Type 2 diabetes mellitus, uncontrolled       Past Surgical History:  Procedure Laterality Date   ABDOMINAL HYSTERECTOMY     AMPUTATION TOE Right 05/08/2020   Procedure: AMPUTATION TOE-Right 4th Toe;  Surgeon: Caroline More, DPM;  Location: [facility];  Service: Podiatry;  Laterality: Right;   APPENDECTOMY     BREAST REDUCTION SURGERY  2001   CATARACT EXTRACTION     CESAREAN SECTION  1976   COLONOSCOPY  03/05/2013   Nml - due for repeat 03/06/2018   COLONOSCOPY WITH PROPOFOL N/A 03/18/2019   Procedure: COLONOSCOPY WITH PROPOFOL;  Surgeon: Toledo, Benay Pike, MD;  Location: [facility];  Service: Gastroenterology;  Laterality: N/A;   DIAGNOSTIC LAPAROSCOPY     DILATION AND CURETTAGE OF UTERUS  1989   ENDARTERECTOMY FEMORAL Bilateral 03/09/2018   Procedure: ENDARTERECTOMY FEMORAL;  Surgeon: Algernon Huxley, MD;  Location: [facility];  Service: Vascular;  Laterality: Bilateral;   ENDARTERECTOMY POPLITEAL Left 03/09/2018  Procedure: ENDARTERECTOMY POPLITEAL AND SFA;  Surgeon: Algernon Huxley, MD;  Location: [facility];  Service: Vascular;  Laterality: Left;   ESOPHAGOGASTRODUODENOSCOPY  03/05/2013   ESOPHAGOGASTRODUODENOSCOPY (EGD) WITH PROPOFOL N/A 03/18/2019   Procedure: ESOPHAGOGASTRODUODENOSCOPY (EGD) WITH PROPOFOL;  Surgeon: Toledo, Benay Pike, MD;  Location: [facility];  Service: Gastroenterology;  Laterality: N/A;   EYE SURGERY     Eyelid Surgery  2012   INTRAMEDULLARY (IM) NAIL INTERTROCHANTERIC Left 10/30/2015   Procedure: INTRAMEDULLARY (IM) NAIL INTERTROCHANTRIC ;  Surgeon: Hessie Knows, MD;  Location: [facility];  Service: Orthopedics;  Laterality: Left;   KYPHOPLASTY N/A 10/25/2018   Procedure:  L4 KYPHOPLASTY;  Surgeon: Hessie Knows, MD;  Location: [facility];  Service: Orthopedics;  Laterality: N/A;   LAPAROSCOPIC HYSTERECTOMY  2000   total   LOWER EXTREMITY ANGIOGRAPHY Left 03/08/2017   Procedure: LOWER EXTREMITY ANGIOGRAPHY;  Surgeon: Algernon Huxley, MD;  Location: [place] [facility];  Service: Cardiovascular;  Laterality: Left;   LOWER EXTREMITY ANGIOGRAPHY Left 10/30/2017   Procedure: LOWER EXTREMITY ANGIOGRAPHY;  Surgeon: Algernon Huxley, MD;  Location: [place] [facility];  Service: Cardiovascular;  Laterality: Left;   LOWER EXTREMITY ANGIOGRAPHY Right 03/08/2018   Procedure: LOWER EXTREMITY ANGIOGRAPHY;  Surgeon: Algernon Huxley, MD;  Location: [place] [facility];  Service: Cardiovascular;  Laterality: Right;   LOWER EXTREMITY ANGIOGRAPHY Left 10/01/2018   Procedure: LOWER EXTREMITY ANGIOGRAPHY;  Surgeon: Algernon Huxley, MD;  Location: [place] [facility];  Service: Cardiovascular;  Laterality: Left;   LOWER EXTREMITY ANGIOGRAPHY Right 10/08/2018   Procedure: LOWER EXTREMITY ANGIOGRAPHY;  Surgeon: Algernon Huxley, MD;  Location: [place] [facility];  Service: Cardiovascular;  Laterality: Right;   LOWER EXTREMITY ANGIOGRAPHY Right 05/07/2020   Procedure: Lower Extremity Angiography;  Surgeon: Algernon Huxley, MD;  Location: [place] [facility];  Service: Cardiovascular;  Laterality: Right;   PERIPHERAL VASCULAR INTERVENTION  03/08/2018   Procedure: PERIPHERAL VASCULAR INTERVENTION;  Surgeon: Algernon Huxley, MD;  Location: [place] [facility];  Service: Cardiovascular;;   PORTA CATH INSERTION N/A 02/17/2020   Procedure: PORTA CATH INSERTION;  Surgeon: Algernon Huxley, MD;  Location: [place] [facility];  Service: Cardiovascular;  Laterality: N/A;   REDUCTION MAMMAPLASTY  1997   SACROPLASTY N/A 10/25/2018   Procedure: S1 SACROPLASTY;  Surgeon: Hessie Knows, MD;  Location: [facility];  Service: Orthopedics;  Laterality: N/A;    No Known Allergies   Current Outpatient Medications  Medication Sig  Dispense Refill   ALPRAZolam (XANAX) 0.25 MG tablet Take 0.25 mg by mouth daily as needed for anxiety.     amLODipine (NORVASC) 5 MG tablet Take 5 mg by mouth 2 (two) times daily.     aspirin EC 81 MG tablet Take 81 mg by mouth daily.      cholecalciferol (VITAMIN D) 1000 units tablet Take 1,000 Units by mouth 2 (two) times daily.     CORAL CALCIUM PO Take 1 tablet by mouth 2 (two) times daily.      denosumab (PROLIA) 60 MG/ML SOLN injection Inject 60 mg into the skin every 6 (six) months.      erythromycin base (E-MYCIN) 500 MG tablet Take 1 tablet (500 mg total) by mouth 4 (four) times daily. 6 tablet 0   escitalopram (LEXAPRO) 10 MG tablet Take 10 mg by mouth daily.     famotidine (PEPCID) 40 MG tablet Take 40 mg by mouth daily.     furosemide (LASIX) 20 MG tablet Take 20  mg by mouth every Monday, Wednesday, and Friday.     gabapentin (NEURONTIN) 300 MG capsule Take one capsule at night as needed for your neuropathy foot pain.     levothyroxine (SYNTHROID, LEVOTHROID) 100 MCG tablet Take 100 mcg by mouth daily before breakfast.   3   lidocaine-prilocaine (EMLA) cream Apply 1 application topically as needed (apply prior to port a cath access). 30 g 3   Magnesium 500 MG TABS Take 500 mg by mouth every morning.      metFORMIN (GLUCOPHAGE) 1000 MG tablet Take 1,000 mg by mouth 2 (two) times daily with a meal.     metoprolol succinate (TOPROL-XL) 50 MG 24 hr tablet Take 50 mg by mouth daily.     metroNIDAZOLE (FLAGYL) 500 MG tablet Take 2 tabs (1000 mg total) at 8 am, again at 2 pm, and again at 8 pm the day prior to surgery. 6 tablet 0   mirtazapine (REMERON) 15 MG tablet Take 15 mg by mouth daily as needed (Depression).     Multiple Vitamin (MULTIVITAMIN WITH MINERALS) TABS tablet Take 1 tablet by mouth daily. Centrum Silver     nystatin cream (MYCOSTATIN) Apply 1 application topically daily as needed (Yeast infection).     olmesartan (BENICAR) 20 MG tablet Take 20 mg by mouth daily.      pantoprazole sodium (PROTONIX) 40 mg Take 40 mg by mouth every morning.      polyethylene glycol powder (MIRALAX) 17 GM/SCOOP powder Mix with 64 ounces of Gatorade (no red) the day prior to surgery and drink as directed. 238 g 0   rosuvastatin (CRESTOR) 20 MG tablet Take 20 mg by mouth every morning.     TRESIBA FLEXTOUCH 200 UNIT/ML SOPN Inject 25-30 Units as directed at bedtime. Titrate according to fasting blood glucose not to exceed 50 units a day  5   vitamin B-12 (CYANOCOBALAMIN) 1000 MCG tablet Take 1,000 mcg by mouth daily.     vitamin C (ASCORBIC ACID) 250 MG tablet Take 250 mg by mouth daily.     vitamin E 400 UNIT capsule Take 400 Units by mouth daily.     XARELTO 20 MG TABS tablet TAKE 1 TABLET BY MOUTH DAILY WITH SUPPER. 90 tablet 3   zolpidem (AMBIEN) 10 MG tablet Take 10 mg by mouth at bedtime.     No current facility-administered medications for this visit.    Family History Family History  Problem Relation Age of Onset   Coronary artery disease Father    Heart attack Father    Coronary artery disease Mother    Heart attack Mother    Ovarian cancer Sister 30       sister had hormonal therapy for IVF txs-which increased risk factor for ovarian cancer   Breast cancer Neg Hx       Social History Social History   Tobacco Use   Smoking status: Former    Packs/day: 1.00    Years: 20.00    Pack years: 20.00    Types: Cigarettes    Quit date: 03/07/1996    Years since quitting: 24.8   Smokeless tobacco: Never   Tobacco comments:    started smoking at age 47  Vaping Use   Vaping Use: Never used  Substance Use Topics   Alcohol use: No    Alcohol/week: 0.0 standard drinks   Drug use: No        Review of Systems  Constitutional:  Positive for malaise/fatigue.  HENT:  Positive  for hearing loss.   °Cardiovascular:  Positive for claudication and leg swelling.  °Gastrointestinal:  Positive for blood in stool and melena.  °Genitourinary:  Positive for frequency and  urgency.  °Musculoskeletal:  Negative for back pain and neck pain.  °Skin: Negative.   °Neurological: Negative.   °Psychiatric/Behavioral: Negative.    °  ° °Physical Exam °Blood pressure (!) 123/57, pulse 86, temperature 98.5 °F (36.9 °C), temperature source Oral, height 5' 5" (1.651 m), weight 165 lb 9.6 oz (75.1 kg), SpO2 97 %. °Last Weight  Most recent update: 01/19/2021  1:07 PM  ° ° Weight  °75.1 kg (165 lb 9.6 oz)  °      ° °  ° ° °CONSTITUTIONAL: Well developed, and nourished, appropriately responsive and aware without distress.   °EYES: Sclera non-icteric.   °EARS, NOSE, MOUTH AND THROAT: Mask worn.    Hearing is intact to voice.  °NECK: Trachea is midline, and there is no jugular venous distension.  °LYMPH NODES:  Lymph nodes in the neck are not enlarged. °RESPIRATORY:  Lungs are clear, and breath sounds are equal bilaterally. Normal respiratory effort without pathologic use of accessory muscles. °CARDIOVASCULAR: Heart is regular in rate and rhythm. °GI: The abdomen has a midline infraumbilical scar, otherwise soft, nontender, and nondistended. There were no palpable masses. I did not appreciate hepatosplenomegaly. There were normal bowel sounds. °MUSCULOSKELETAL:  Symmetrical muscle tone appreciated in all four extremities.    °SKIN: Skin turgor is normal. No pathologic skin lesions appreciated.  °NEUROLOGIC:  Motor and sensation appear grossly normal.  Cranial nerves are grossly without defect. °PSYCH:  Alert and oriented to person, place and time. Affect is appropriate for situation. ° °Data Reviewed °I have personally reviewed what is currently available of the patient's imaging, recent labs and medical records.   °Labs:  °CBC Latest Ref Rng & Units 01/11/2021 10/16/2020 08/12/2020  °WBC 4.0 - 10.5 K/uL 6.3 6.1 3.7(L)  °Hemoglobin 12.0 - 15.0 g/dL 9.5(L) 9.3(L) 8.2(L)  °Hematocrit 36.0 - 46.0 % 29.0(L) 28.8(L) 26.0(L)  °Platelets 150 - 400 K/uL 265 262 251  ° °CMP Latest Ref Rng & Units 01/11/2021 10/16/2020  08/12/2020  °Glucose 70 - 99 mg/dL 279(H) 195(H) 102(H)  °BUN 8 - 23 mg/dL 34(H) 38(H) 23  °Creatinine 0.44 - 1.00 mg/dL 1.33(H) 1.94(H) 1.74(H)  °Sodium 135 - 145 mmol/L 134(L) 133(L) 141  °Potassium 3.5 - 5.1 mmol/L 4.6 5.1 4.9  °Chloride 98 - 111 mmol/L 102 105 110  °CO2 22 - 32 mmol/L 24 21(L) 23  °Calcium 8.9 - 10.3 mg/dL 9.7 9.2 8.9  °Total Protein 6.5 - 8.1 g/dL 7.1 6.8 -  °Total Bilirubin 0.3 - 1.2 mg/dL 0.6 0.6 -  °Alkaline Phos 38 - 126 U/L 63 46 -  °AST 15 - 41 U/L 20 22 50(H)  °ALT 0 - 44 U/L 18 19 -  ° ° °Imaging: °Radiology review:  °CLINICAL DATA:  Recurrent urinary tract infection and frequent °urination for 1 year. Gas in bladder. Thickened sigmoid colon. °  °EXAM: °CT ABDOMEN AND PELVIS WITHOUT CONTRAST °  °TECHNIQUE: °Multidetector CT imaging of the abdomen and pelvis was performed °following the standard protocol without IV contrast. °  °COMPARISON:  CT abdomen and pelvis 08/11/2020. °  °FINDINGS: °Lower chest: No acute abnormality. °  °Hepatobiliary: No focal liver abnormality. Stones identified within °the gallbladder. No gallbladder wall inflammation or bile duct °dilatation. °  °Pancreas: Unremarkable. No pancreatic ductal dilatation or °surrounding inflammatory changes. °  °Spleen: Normal in   size without focal abnormality.   Adrenals/Urinary Tract: Normal adrenal glands. Punctate stone within upper pole collecting system of the right kidney noted, image 27/2. No left renal calculi. No hydronephrosis or hydroureter identified bilaterally. No focal bladder abnormality or bladder calculi. Gas noted within the non dependent portion of the bladder.   Stomach/Bowel: Stomach appears normal. Status post appendectomy. No bowel wall thickening, inflammation, or distension.   Vascular/Lymphatic: Aortic atherosclerosis. No aneurysm. No signs of abdominopelvic adenopathy.   Reproductive: Status post hysterectomy. No adnexal masses.   Other: Fat-containing inguinal hernias noted  bilaterally. No free fluid or fluid collections.   Musculoskeletal: Status post ORIF of the left femur bilateral sacroplasty has been performed as noted previously. Treated compression deformity noted at L4. Unchanged compression fracture involving the L3 vertebra. Multilevel thoracolumbar degenerative disc disease.   IMPRESSION: 1. No acute findings within the abdomen or pelvis. 2. Punctate right upper pole renal calculus. 3. Gas noted non dependent portion of the bladder. In the absence of recent instrumentation this may be related to infection. 4. Gallstones. 5. Aortic atherosclerosis (ICD10-I70.0).     Electronically Signed   By: Kerby Moors M.D.   On: 12/03/2020 14:38 Within last 24 hrs: No results found. CLINICAL DATA:  76 year old female with left lower quadrant abdominal pain and episodes of bloody stools over the past 4 days.   EXAM: CT ABDOMEN AND PELVIS WITHOUT CONTRAST   TECHNIQUE: Multidetector CT imaging of the abdomen and pelvis was performed following the standard protocol without IV contrast.   COMPARISON:  CT Abdomen and Pelvis 04/01/2020 and earlier.   FINDINGS: Lower chest: Stable, negative.   Hepatobiliary: Chronic cholelithiasis. The gallbladder is larger today but there is no pericholecystic inflammation. Negative noncontrast liver.   Pancreas: Negative.   Spleen: Negative.   Adrenals/Urinary Tract: Normal adrenal glands. Nonobstructed kidneys. 2-3 mm right upper pole nephrolithiasis. Questionable punctate left nephrolithiasis. Scattered small areas of renal cortical scarring appear stable. Proximal ureters are decompressed. Diminutive and unremarkable bladder.   Stomach/Bowel: Redundant distal large bowel with some retained stool. Subtle 7 cm segment of sigmoid colon inflammation suspected on series 2, image 73 with mild sigmoid wall thickening and an indistinct appearance of the wall. No regional diverticula. No mesenteric  inflammation.   Upstream descending colon is within normal limits. Mildly redundant transverse colon. Negative right colon. Diminutive or absent appendix. Decompressed and negative terminal ileum. No dilated small bowel. Negative stomach and duodenum. No free air or free fluid. Stable postoperative changes to the lower midline abdominal wall.   Vascular/Lymphatic: Extensive Aortoiliac calcified atherosclerosis. Chronic postoperative changes overlying the right common femoral artery with bilateral proximal femoral vascular stents. Vascular patency is not evaluated in the absence of IV contrast. No lymphadenopathy.   Reproductive: Absent uterus.  Diminutive or absent ovaries.   Other: No pelvic free fluid. Small fat containing inguinal hernias are stable.   Musculoskeletal: Stable visualized osseous structures. Chronic lumbar compression fractures, L4 previously augmented. Previous bilateral sacroplasty. Previous left femur ORIF.   IMPRESSION: 1. Subtle wall thickening of a 7 cm segment of the sigmoid colon suspicious for mild colitis in this setting. No associated diverticula.   2. No other acute or inflammatory process identified in the non-contrast abdomen or pelvis. Chronic cholelithiasis. Right nephrolithiasis. Aortic Atherosclerosis (ICD10-I70.0). Bilateral proximal femoral artery stents.     Electronically Signed   By: Genevie Ann M.D.   On: 08/11/2020 07:11 Assessment    Recurrent UTIs consistent with colovesical fistula despite known diverticulitis or  other primary colonic pathology. 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   Wound disruption, post-op, skin, subsequent encounter 04/09/2018   Postoperative wound infection 03/21/2018   Atherosclerosis of artery of extremity with intermittent claudication ([place]) 03/08/2018   Atherosclerosis of native arteries of the extremities with ulceration ([place]) 10/13/2017   DM type 2 with diabetic peripheral neuropathy ([place]) 10/12/2017   History of hip fracture 10/12/2017   Type 2 diabetes mellitus with vascular disease ([place]) 10/12/2017   Carotid atherosclerosis, bilateral 07/11/2017   Medicare annual wellness visit, initial 05/03/2017   Peripheral vascular disease, unspecified ([place]) 03/01/2017   Diabetic ulcer of toe of left foot associated with type 2 diabetes mellitus ([place]) 03/01/2017   Chronic painful diabetic neuropathy ([place]) 06/02/2016   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   Hip fracture ([place]) 10/30/2015   Greater trochanter fracture ([place]) 10/29/2015   Acquired hypothyroidism 10/16/2015   Stage 3b chronic kidney disease ([place]) 10/16/2015   Hyperlipidemia, mixed 04/16/2015   Benign essential hypertension 04/16/2015   Closed compression fracture of lumbar vertebra ([place]) 04/16/2015   Iron deficiency anemia 09/22/2014   Disease of thyroid gland 09/22/2014   Diabetes mellitus type 2, uncontrolled [id number]   Lichen sclerosus [id number]   History of positive PPD 10/08/2013    Plan    Robotic assisted sigmoid colectomy, possible mobilization of splenic flexure.  We will definitely wish to utilize ureteral imaging through instillation of ICG and appreciate urology support. We discussed the risks of colectomy, including but not limited to the following: Anesthesia, bleeding, injury to adjacent structures, anastomotic leak, recurrence of fistula, possible need for  additional procedures i.e. colostomy (worse case scenario) Bowel prep reviewed.  Rx's sent.   We discussed with holding her Xarelto for 2 doses pre-surgery.  Will Rx 3 doses of Lovenox to bridge her until 24 hrs pre-op.    Face-to-face time spent with the patient and accompanying care providers(if present) was 30 minutes, with more than 50% of the time spent counseling, educating, and coordinating care of the patient.    These notes generated with voice recognition software. I apologize for typographical errors.  Ronny Bacon M.D., FACS 01/19/2021, 1:25 PM

## 2021-01-19 NOTE — Progress Notes (Signed)
Patient ID: Cheryl Leonard, female   DOB: 04-Mar-1945, 76 y.o.   MRN: [id number]  Chief Complaint: Probable colovesical fistula  History of Present Illness   Follow up visit for surgical prep.  Previously:  Cheryl Leonard is a 75 y.o. female with history of urosepsis about a year ago, nearly died she reports.   Has had subsequent recurrent E. coli UTIs roughly about every 2 weeks.  She reports some intermittency to her stream, with associated frequency and dysuria.  She has no known history of diverticulitis.  Had a colonoscopy in 2021 which showed just a few diverticula.  CT scans x2 have shown foci of air in the bladder, with some adjacent thickening of the sigmoid colon.  No obvious or clear-cut fistula identified on CT imaging.  She is currently finished a recent course of antibiotics, and wants to pursue her plans over Christmas and new year.  At her request she desires to pursue surgery after the third week of January.  She will be with her daughter who has been useful in her care.  She currently takes Xarelto for her peripheral vascular disease.   History recurrent UTI and overactive bladder CT March 2022 with foci of gas in bladder and thickened sigmoid colon Cystoscopy 09/2020 with with inflamed bladder mucosa with bullous edema anterior bladder wall Follow-up CT 12/02/2020 with persistent gas in bladder Presently with complaints of increased frequency, urgency and dysuria Colonoscopy 03/2019 with a few, small-mouth diverticula in the sigmoid colon Past Medical History Past Medical History:  Diagnosis Date   Anemia    Anxiety    Arthritis    Gout   Cataracts, both eyes    Diabetic retinopathy ([place])    NPDR OU   Diverticulitis    GERD (gastroesophageal reflux disease)    Gout    Headache    h/o migraines   History of fracture of patella    right knee   History of positive PPD    Patient always shows positive   Hyperlipidemia    Hypertension    Hypertensive retinopathy     OU   Hypothyroidism    Lichen sclerosus [id number]   of vulva   Metatarsal fracture    Neuropathy    Osteopenia    Peripheral vascular disease (HCC)    Polyneuropathy    numbness and tingling in feet and toes   Renal insufficiency    Stage 3   Sleep apnea    does not use cpap-lost weight    Type 2 diabetes mellitus, uncontrolled       Past Surgical History:  Procedure Laterality Date   ABDOMINAL HYSTERECTOMY     AMPUTATION TOE Right 05/08/2020   Procedure: AMPUTATION TOE-Right 4th Toe;  Surgeon: Caroline More, DPM;  Location: [facility];  Service: Podiatry;  Laterality: Right;   APPENDECTOMY     BREAST REDUCTION SURGERY  2001   CATARACT EXTRACTION     CESAREAN SECTION  1976   COLONOSCOPY  03/05/2013   Nml - due for repeat 03/06/2018   COLONOSCOPY WITH PROPOFOL N/A 03/18/2019   Procedure: COLONOSCOPY WITH PROPOFOL;  Surgeon: Toledo, Benay Pike, MD;  Location: [facility];  Service: Gastroenterology;  Laterality: N/A;   DIAGNOSTIC LAPAROSCOPY     DILATION AND CURETTAGE OF UTERUS  1989   ENDARTERECTOMY FEMORAL Bilateral 03/09/2018   Procedure: ENDARTERECTOMY FEMORAL;  Surgeon: Algernon Huxley, MD;  Location: [facility];  Service: Vascular;  Laterality: Bilateral;   ENDARTERECTOMY POPLITEAL Left 03/09/2018  Procedure: ENDARTERECTOMY POPLITEAL AND SFA;  Surgeon: Algernon Huxley, MD;  Location: [facility];  Service: Vascular;  Laterality: Left;   ESOPHAGOGASTRODUODENOSCOPY  03/05/2013   ESOPHAGOGASTRODUODENOSCOPY (EGD) WITH PROPOFOL N/A 03/18/2019   Procedure: ESOPHAGOGASTRODUODENOSCOPY (EGD) WITH PROPOFOL;  Surgeon: Toledo, Benay Pike, MD;  Location: [facility];  Service: Gastroenterology;  Laterality: N/A;   EYE SURGERY     Eyelid Surgery  2012   INTRAMEDULLARY (IM) NAIL INTERTROCHANTERIC Left 10/30/2015   Procedure: INTRAMEDULLARY (IM) NAIL INTERTROCHANTRIC ;  Surgeon: Hessie Knows, MD;  Location: [facility];  Service: Orthopedics;  Laterality: Left;   KYPHOPLASTY N/A 10/25/2018   Procedure:  L4 KYPHOPLASTY;  Surgeon: Hessie Knows, MD;  Location: [facility];  Service: Orthopedics;  Laterality: N/A;   LAPAROSCOPIC HYSTERECTOMY  2000   total   LOWER EXTREMITY ANGIOGRAPHY Left 03/08/2017   Procedure: LOWER EXTREMITY ANGIOGRAPHY;  Surgeon: Algernon Huxley, MD;  Location: [place] [facility];  Service: Cardiovascular;  Laterality: Left;   LOWER EXTREMITY ANGIOGRAPHY Left 10/30/2017   Procedure: LOWER EXTREMITY ANGIOGRAPHY;  Surgeon: Algernon Huxley, MD;  Location: [place] [facility];  Service: Cardiovascular;  Laterality: Left;   LOWER EXTREMITY ANGIOGRAPHY Right 03/08/2018   Procedure: LOWER EXTREMITY ANGIOGRAPHY;  Surgeon: Algernon Huxley, MD;  Location: [place] [facility];  Service: Cardiovascular;  Laterality: Right;   LOWER EXTREMITY ANGIOGRAPHY Left 10/01/2018   Procedure: LOWER EXTREMITY ANGIOGRAPHY;  Surgeon: Algernon Huxley, MD;  Location: [place] [facility];  Service: Cardiovascular;  Laterality: Left;   LOWER EXTREMITY ANGIOGRAPHY Right 10/08/2018   Procedure: LOWER EXTREMITY ANGIOGRAPHY;  Surgeon: Algernon Huxley, MD;  Location: [place] [facility];  Service: Cardiovascular;  Laterality: Right;   LOWER EXTREMITY ANGIOGRAPHY Right 05/07/2020   Procedure: Lower Extremity Angiography;  Surgeon: Algernon Huxley, MD;  Location: [place] [facility];  Service: Cardiovascular;  Laterality: Right;   PERIPHERAL VASCULAR INTERVENTION  03/08/2018   Procedure: PERIPHERAL VASCULAR INTERVENTION;  Surgeon: Algernon Huxley, MD;  Location: [place] [facility];  Service: Cardiovascular;;   PORTA CATH INSERTION N/A 02/17/2020   Procedure: PORTA CATH INSERTION;  Surgeon: Algernon Huxley, MD;  Location: [place] [facility];  Service: Cardiovascular;  Laterality: N/A;   REDUCTION MAMMAPLASTY  1997   SACROPLASTY N/A 10/25/2018   Procedure: S1 SACROPLASTY;  Surgeon: Hessie Knows, MD;  Location: [facility];  Service: Orthopedics;  Laterality: N/A;    No Known Allergies   Current Outpatient Medications  Medication Sig  Dispense Refill   ALPRAZolam (XANAX) 0.25 MG tablet Take 0.25 mg by mouth daily as needed for anxiety.     amLODipine (NORVASC) 5 MG tablet Take 5 mg by mouth 2 (two) times daily.     aspirin EC 81 MG tablet Take 81 mg by mouth daily.      cholecalciferol (VITAMIN D) 1000 units tablet Take 1,000 Units by mouth 2 (two) times daily.     CORAL CALCIUM PO Take 1 tablet by mouth 2 (two) times daily.      denosumab (PROLIA) 60 MG/ML SOLN injection Inject 60 mg into the skin every 6 (six) months.      erythromycin base (E-MYCIN) 500 MG tablet Take 1 tablet (500 mg total) by mouth 4 (four) times daily. 6 tablet 0   escitalopram (LEXAPRO) 10 MG tablet Take 10 mg by mouth daily.     famotidine (PEPCID) 40 MG tablet Take 40 mg by mouth daily.     furosemide (LASIX) 20 MG tablet Take 20  mg by mouth every Monday, Wednesday, and Friday.     gabapentin (NEURONTIN) 300 MG capsule Take one capsule at night as needed for your neuropathy foot pain.     levothyroxine (SYNTHROID, LEVOTHROID) 100 MCG tablet Take 100 mcg by mouth daily before breakfast.   3   lidocaine-prilocaine (EMLA) cream Apply 1 application topically as needed (apply prior to port a cath access). 30 g 3   Magnesium 500 MG TABS Take 500 mg by mouth every morning.      metFORMIN (GLUCOPHAGE) 1000 MG tablet Take 1,000 mg by mouth 2 (two) times daily with a meal.     metoprolol succinate (TOPROL-XL) 50 MG 24 hr tablet Take 50 mg by mouth daily.     metroNIDAZOLE (FLAGYL) 500 MG tablet Take 2 tabs (1000 mg total) at 8 am, again at 2 pm, and again at 8 pm the day prior to surgery. 6 tablet 0   mirtazapine (REMERON) 15 MG tablet Take 15 mg by mouth daily as needed (Depression).     Multiple Vitamin (MULTIVITAMIN WITH MINERALS) TABS tablet Take 1 tablet by mouth daily. Centrum Silver     nystatin cream (MYCOSTATIN) Apply 1 application topically daily as needed (Yeast infection).     olmesartan (BENICAR) 20 MG tablet Take 20 mg by mouth daily.      pantoprazole sodium (PROTONIX) 40 mg Take 40 mg by mouth every morning.      polyethylene glycol powder (MIRALAX) 17 GM/SCOOP powder Mix with 64 ounces of Gatorade (no red) the day prior to surgery and drink as directed. 238 g 0   rosuvastatin (CRESTOR) 20 MG tablet Take 20 mg by mouth every morning.     TRESIBA FLEXTOUCH 200 UNIT/ML SOPN Inject 25-30 Units as directed at bedtime. Titrate according to fasting blood glucose not to exceed 50 units a day  5   vitamin B-12 (CYANOCOBALAMIN) 1000 MCG tablet Take 1,000 mcg by mouth daily.     vitamin C (ASCORBIC ACID) 250 MG tablet Take 250 mg by mouth daily.     vitamin E 400 UNIT capsule Take 400 Units by mouth daily.     XARELTO 20 MG TABS tablet TAKE 1 TABLET BY MOUTH DAILY WITH SUPPER. 90 tablet 3   zolpidem (AMBIEN) 10 MG tablet Take 10 mg by mouth at bedtime.     No current facility-administered medications for this visit.    Family History Family History  Problem Relation Age of Onset   Coronary artery disease Father    Heart attack Father    Coronary artery disease Mother    Heart attack Mother    Ovarian cancer Sister 31       sister had hormonal therapy for IVF txs-which increased risk factor for ovarian cancer   Breast cancer Neg Hx       Social History Social History   Tobacco Use   Smoking status: Former    Packs/day: 1.00    Years: 20.00    Pack years: 20.00    Types: Cigarettes    Quit date: 03/07/1996    Years since quitting: 24.8   Smokeless tobacco: Never   Tobacco comments:    started smoking at age 64  Vaping Use   Vaping Use: Never used  Substance Use Topics   Alcohol use: No    Alcohol/week: 0.0 standard drinks   Drug use: No        Review of Systems  Constitutional:  Positive for malaise/fatigue.  HENT:  Positive  for hearing loss.   Cardiovascular:  Positive for claudication and leg swelling.  Gastrointestinal:  Positive for blood in stool and melena.  Genitourinary:  Positive for frequency and  urgency.  Musculoskeletal:  Negative for back pain and neck pain.  Skin: Negative.   Neurological: Negative.   Psychiatric/Behavioral: Negative.       Physical Exam Blood pressure (!) 123/57, pulse 86, temperature 98.5 F (36.9 C), temperature source Oral, height 5\' 5"  (1.651 m), weight 165 lb 9.6 oz (75.1 kg), SpO2 97 %. Last Weight  Most recent update: 01/19/2021  1:07 PM    Weight  75.1 kg (165 lb 9.6 oz)             CONSTITUTIONAL: Well developed, and nourished, appropriately responsive and aware without distress.   EYES: Sclera non-icteric.   EARS, NOSE, MOUTH AND THROAT: Mask worn.    Hearing is intact to voice.  NECK: Trachea is midline, and there is no jugular venous distension.  LYMPH NODES:  Lymph nodes in the neck are not enlarged. RESPIRATORY:  Lungs are clear, and breath sounds are equal bilaterally. Normal respiratory effort without pathologic use of accessory muscles. CARDIOVASCULAR: Heart is regular in rate and rhythm. GI: The abdomen has a midline infraumbilical scar, otherwise soft, nontender, and nondistended. There were no palpable masses. I did not appreciate hepatosplenomegaly. There were normal bowel sounds. MUSCULOSKELETAL:  Symmetrical muscle tone appreciated in all four extremities.    SKIN: Skin turgor is normal. No pathologic skin lesions appreciated.  NEUROLOGIC:  Motor and sensation appear grossly normal.  Cranial nerves are grossly without defect. PSYCH:  Alert and oriented to person, place and time. Affect is appropriate for situation.  Data Reviewed I have personally reviewed what is currently available of the patient's imaging, recent labs and medical records.   Labs:  CBC Latest Ref Rng & Units 01/11/2021 10/16/2020 08/12/2020  WBC 4.0 - 10.5 K/uL 6.3 6.1 3.7(L)  Hemoglobin 12.0 - 15.0 g/dL 9.5(L) 9.3(L) 8.2(L)  Hematocrit 36.0 - 46.0 % 29.0(L) 28.8(L) 26.0(L)  Platelets 150 - 400 K/uL 265 262 251   CMP Latest Ref Rng & Units 01/11/2021 10/16/2020  08/12/2020  Glucose 70 - 99 mg/dL 279(H) 195(H) 102(H)  BUN 8 - 23 mg/dL 34(H) 38(H) 23  Creatinine 0.44 - 1.00 mg/dL 1.33(H) 1.94(H) 1.74(H)  Sodium 135 - 145 mmol/L 134(L) 133(L) 141  Potassium 3.5 - 5.1 mmol/L 4.6 5.1 4.9  Chloride 98 - 111 mmol/L 102 105 110  CO2 22 - 32 mmol/L 24 21(L) 23  Calcium 8.9 - 10.3 mg/dL 9.7 9.2 8.9  Total Protein 6.5 - 8.1 g/dL 7.1 6.8 -  Total Bilirubin 0.3 - 1.2 mg/dL 0.6 0.6 -  Alkaline Phos 38 - 126 U/L 63 46 -  AST 15 - 41 U/L 20 22 50(H)  ALT 0 - 44 U/L 18 19 -    Imaging: Radiology review:  CLINICAL DATA:  Recurrent urinary tract infection and frequent urination for 1 year. Gas in bladder. Thickened sigmoid colon.   EXAM: CT ABDOMEN AND PELVIS WITHOUT CONTRAST   TECHNIQUE: Multidetector CT imaging of the abdomen and pelvis was performed following the standard protocol without IV contrast.   COMPARISON:  CT abdomen and pelvis 08/11/2020.   FINDINGS: Lower chest: No acute abnormality.   Hepatobiliary: No focal liver abnormality. Stones identified within the gallbladder. No gallbladder wall inflammation or bile duct dilatation.   Pancreas: Unremarkable. No pancreatic ductal dilatation or surrounding inflammatory changes.   Spleen: Normal in  size without focal abnormality.   Adrenals/Urinary Tract: Normal adrenal glands. Punctate stone within upper pole collecting system of the right kidney noted, image 27/2. No left renal calculi. No hydronephrosis or hydroureter identified bilaterally. No focal bladder abnormality or bladder calculi. Gas noted within the non dependent portion of the bladder.   Stomach/Bowel: Stomach appears normal. Status post appendectomy. No bowel wall thickening, inflammation, or distension.   Vascular/Lymphatic: Aortic atherosclerosis. No aneurysm. No signs of abdominopelvic adenopathy.   Reproductive: Status post hysterectomy. No adnexal masses.   Other: Fat-containing inguinal hernias noted  bilaterally. No free fluid or fluid collections.   Musculoskeletal: Status post ORIF of the left femur bilateral sacroplasty has been performed as noted previously. Treated compression deformity noted at L4. Unchanged compression fracture involving the L3 vertebra. Multilevel thoracolumbar degenerative disc disease.   IMPRESSION: 1. No acute findings within the abdomen or pelvis. 2. Punctate right upper pole renal calculus. 3. Gas noted non dependent portion of the bladder. In the absence of recent instrumentation this may be related to infection. 4. Gallstones. 5. Aortic atherosclerosis (ICD10-I70.0).     Electronically Signed   By: Kerby Moors M.D.   On: 12/03/2020 14:38 Within last 24 hrs: No results found. CLINICAL DATA:  76 year old female with left lower quadrant abdominal pain and episodes of bloody stools over the past 4 days.   EXAM: CT ABDOMEN AND PELVIS WITHOUT CONTRAST   TECHNIQUE: Multidetector CT imaging of the abdomen and pelvis was performed following the standard protocol without IV contrast.   COMPARISON:  CT Abdomen and Pelvis 04/01/2020 and earlier.   FINDINGS: Lower chest: Stable, negative.   Hepatobiliary: Chronic cholelithiasis. The gallbladder is larger today but there is no pericholecystic inflammation. Negative noncontrast liver.   Pancreas: Negative.   Spleen: Negative.   Adrenals/Urinary Tract: Normal adrenal glands. Nonobstructed kidneys. 2-3 mm right upper pole nephrolithiasis. Questionable punctate left nephrolithiasis. Scattered small areas of renal cortical scarring appear stable. Proximal ureters are decompressed. Diminutive and unremarkable bladder.   Stomach/Bowel: Redundant distal large bowel with some retained stool. Subtle 7 cm segment of sigmoid colon inflammation suspected on series 2, image 73 with mild sigmoid wall thickening and an indistinct appearance of the wall. No regional diverticula. No mesenteric  inflammation.   Upstream descending colon is within normal limits. Mildly redundant transverse colon. Negative right colon. Diminutive or absent appendix. Decompressed and negative terminal ileum. No dilated small bowel. Negative stomach and duodenum. No free air or free fluid. Stable postoperative changes to the lower midline abdominal wall.   Vascular/Lymphatic: Extensive Aortoiliac calcified atherosclerosis. Chronic postoperative changes overlying the right common femoral artery with bilateral proximal femoral vascular stents. Vascular patency is not evaluated in the absence of IV contrast. No lymphadenopathy.   Reproductive: Absent uterus.  Diminutive or absent ovaries.   Other: No pelvic free fluid. Small fat containing inguinal hernias are stable.   Musculoskeletal: Stable visualized osseous structures. Chronic lumbar compression fractures, L4 previously augmented. Previous bilateral sacroplasty. Previous left femur ORIF.   IMPRESSION: 1. Subtle wall thickening of a 7 cm segment of the sigmoid colon suspicious for mild colitis in this setting. No associated diverticula.   2. No other acute or inflammatory process identified in the non-contrast abdomen or pelvis. Chronic cholelithiasis. Right nephrolithiasis. Aortic Atherosclerosis (ICD10-I70.0). Bilateral proximal femoral artery stents.     Electronically Signed   By: Genevie Ann M.D.   On: 08/11/2020 07:11 Assessment    Recurrent UTIs consistent with colovesical fistula despite known diverticulitis or  other primary colonic pathology. 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   Wound disruption, post-op, skin, subsequent encounter 04/09/2018   Postoperative wound infection 03/21/2018   Atherosclerosis of artery of extremity with intermittent claudication ([place]) 03/08/2018   Atherosclerosis of native arteries of the extremities with ulceration ([place]) 10/13/2017   DM type 2 with diabetic peripheral neuropathy ([place]) 10/12/2017   History of hip fracture 10/12/2017   Type 2 diabetes mellitus with vascular disease ([place]) 10/12/2017   Carotid atherosclerosis, bilateral 07/11/2017   Medicare annual wellness visit, initial 05/03/2017   Peripheral vascular disease, unspecified ([place]) 03/01/2017   Diabetic ulcer of toe of left foot associated with type 2 diabetes mellitus ([place]) 03/01/2017   Chronic painful diabetic neuropathy ([place]) 06/02/2016   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   Hip fracture ([place]) 10/30/2015   Greater trochanter fracture ([place]) 10/29/2015   Acquired hypothyroidism 10/16/2015   Stage 3b chronic kidney disease ([place]) 10/16/2015   Hyperlipidemia, mixed 04/16/2015   Benign essential hypertension 04/16/2015   Closed compression fracture of lumbar vertebra ([place]) 04/16/2015   Iron deficiency anemia 09/22/2014   Disease of thyroid gland 09/22/2014   Diabetes mellitus type 2, uncontrolled [id number]   Lichen sclerosus [id number]   History of positive PPD 10/08/2013    Plan    Robotic assisted sigmoid colectomy, possible mobilization of splenic flexure.  We will definitely wish to utilize ureteral imaging through instillation of ICG and appreciate urology support. We discussed the risks of colectomy, including but not limited to the following: Anesthesia, bleeding, injury to adjacent structures, anastomotic leak, recurrence of fistula, possible need for  additional procedures i.e. colostomy (worse case scenario) Bowel prep reviewed.  Rx's sent.   We discussed with holding her Xarelto for 2 doses pre-surgery.  Will Rx 3 doses of Lovenox to bridge her until 24 hrs pre-op.    Face-to-face time spent with the patient and accompanying care providers(if present) was 30 minutes, with more than 50% of the time spent counseling, educating, and coordinating care of the patient.    These notes generated with voice recognition software. I apologize for typographical errors.  Ronny Bacon M.D., FACS 01/19/2021, 1:25 PM

## 2021-01-19 NOTE — Patient Instructions (Addendum)
Please pick up your prescriptions at your pharmacy.    If you have any concerns or questions, please feel free to call our office.                           COLON BOWEL PREP  Please follow the instructions carefully. It is important to clean out your bowels & take the prescribed antibiotic pills to lower your chances of a wound infection or abscess.   FIVE DAYS PRIOR TO YOUR SURGERY Stop eating any nuts, popcorn, or fruit with seeds. Stop all fiber supplements such as Metamucil, Citrucel, etc.   Hold taking any blood thinning anticoagulation medication (ex: aspirin, warfarin/Coumadin, Plavix, Xarelto, Eliquis, Pradaxa, etc) as recommended by your medical/cardiology doctor  Obtain what you need at a pharmacy of your choice: -Filled out prescriptions for your oral antibiotics (Neomycin & Metronidazole)  -A bottle of MiraLax / Glycolax (288g) - no prescription required  -A large bottle of Gatorade / Powerade (64oz)  -Dulcolax tablets (4 tabs) - no prescription required   DAY PRIOR TO SURGERY   7:00am Swallow 4 Dulcolax tablets with some water Drink plenty of clear liquids all day to avoid getting dehydrated (Water, juice, soda, coffee, tea, bouillon, jello, etc.)  10:00am Mix the bottle of MiraLax with the 64-oz bottle of Gatorade.  Drink the Gatorade mixture gradually over the next few hours (8oz glass every 15-30 minutes) until gone. You should finish by 2pm.  2:00pm Take 2 Neomycin 500mg  tablets & 2 Metronidazole 500mg  tablets  3:00pm Take 2 Neomycin 500mg  tablets & 2 Metronidazole 500mg  tablets  Drink plenty of clear liquids all evening to avoid getting dehydrated  10:00pm Take 2 Neomycin 500mg  tablets & 2 Metronidazole 500mg  tablets  Do not eat or drink anything after bedtime (midnight) the night before your surgery.   MORNING OF SURGERY Remember to not to drink or eat anything that morning  Hold or take medications as recommended by the hospital staff at your  Preoperative visit

## 2021-01-20 ENCOUNTER — Ambulatory Visit: Payer: Self-pay | Admitting: Surgery

## 2021-01-21 ENCOUNTER — Other Ambulatory Visit: Payer: Self-pay

## 2021-01-21 ENCOUNTER — Encounter: Payer: Self-pay | Admitting: Surgery

## 2021-01-21 MED ORDER — ENOXAPARIN SODIUM 80 MG/0.8ML IJ SOSY: 80.0000 mg | PREFILLED_SYRINGE | Freq: Two times a day (BID) | INTRAMUSCULAR | 0 refills | Status: DC

## 2021-01-22 ENCOUNTER — Other Ambulatory Visit: Payer: Self-pay

## 2021-01-22 ENCOUNTER — Other Ambulatory Visit
Admission: RE | Admit: 2021-01-22 | Discharge: 2021-01-22 | Disposition: A | Discharge status: Home / Self Care | Payer: Medicare Other | Source: Ambulatory Visit | Attending: Surgery | Admitting: Surgery

## 2021-01-22 DIAGNOSIS — Z01812 Encounter for preprocedural laboratory examination: Secondary | ICD-10-CM | POA: Insufficient documentation

## 2021-01-22 DIAGNOSIS — Z20822 Contact with and (suspected) exposure to covid-19: Secondary | ICD-10-CM

## 2021-01-22 LAB — SARS CORONAVIRUS 2 (TAT 6-24 HRS): SARS Coronavirus 2: NEGATIVE

## 2021-01-25 ENCOUNTER — Encounter
Admission: RE | Disposition: A | Discharge status: Home / Self Care | Payer: Self-pay | Source: Home / Self Care | Attending: Surgery

## 2021-01-25 ENCOUNTER — Inpatient Hospital Stay
Admission: RE | Admit: 2021-01-25 | Discharge: 2021-01-28 | DRG: 674 | Disposition: A | Discharge status: Home Health | Payer: Medicare Other | Attending: Surgery | Admitting: Surgery

## 2021-01-25 ENCOUNTER — Other Ambulatory Visit: Payer: Self-pay

## 2021-01-25 ENCOUNTER — Inpatient Hospital Stay: Payer: Medicare Other | Admitting: Anesthesiology

## 2021-01-25 ENCOUNTER — Encounter: Payer: Self-pay | Admitting: Surgery

## 2021-01-25 DIAGNOSIS — N3281 Overactive bladder: Secondary | ICD-10-CM | POA: Diagnosis present

## 2021-01-25 DIAGNOSIS — Z8041 Family history of malignant neoplasm of ovary: Secondary | ICD-10-CM | POA: Diagnosis not present

## 2021-01-25 DIAGNOSIS — Z01812 Encounter for preprocedural laboratory examination: Secondary | ICD-10-CM

## 2021-01-25 DIAGNOSIS — E113293 Type 2 diabetes mellitus with mild nonproliferative diabetic retinopathy without macular edema, bilateral: Secondary | ICD-10-CM | POA: Diagnosis present

## 2021-01-25 DIAGNOSIS — Z8249 Family history of ischemic heart disease and other diseases of the circulatory system: Secondary | ICD-10-CM

## 2021-01-25 DIAGNOSIS — D509 Iron deficiency anemia, unspecified: Secondary | ICD-10-CM | POA: Diagnosis present

## 2021-01-25 DIAGNOSIS — Z20822 Contact with and (suspected) exposure to covid-19: Secondary | ICD-10-CM | POA: Diagnosis present

## 2021-01-25 DIAGNOSIS — E782 Mixed hyperlipidemia: Secondary | ICD-10-CM | POA: Diagnosis present

## 2021-01-25 DIAGNOSIS — Z8616 Personal history of COVID-19: Secondary | ICD-10-CM

## 2021-01-25 DIAGNOSIS — Q438 Other specified congenital malformations of intestine: Secondary | ICD-10-CM

## 2021-01-25 DIAGNOSIS — N1832 Chronic kidney disease, stage 3b: Secondary | ICD-10-CM | POA: Diagnosis present

## 2021-01-25 DIAGNOSIS — Z8719 Personal history of other diseases of the digestive system: Secondary | ICD-10-CM

## 2021-01-25 DIAGNOSIS — N736 Female pelvic peritoneal adhesions (postinfective): Secondary | ICD-10-CM | POA: Diagnosis present

## 2021-01-25 DIAGNOSIS — E1151 Type 2 diabetes mellitus with diabetic peripheral angiopathy without gangrene: Secondary | ICD-10-CM | POA: Diagnosis present

## 2021-01-25 DIAGNOSIS — K5792 Diverticulitis of intestine, part unspecified, without perforation or abscess without bleeding: Secondary | ICD-10-CM | POA: Diagnosis not present

## 2021-01-25 DIAGNOSIS — D631 Anemia in chronic kidney disease: Secondary | ICD-10-CM | POA: Diagnosis present

## 2021-01-25 DIAGNOSIS — E039 Hypothyroidism, unspecified: Secondary | ICD-10-CM | POA: Diagnosis present

## 2021-01-25 DIAGNOSIS — I129 Hypertensive chronic kidney disease with stage 1 through stage 4 chronic kidney disease, or unspecified chronic kidney disease: Secondary | ICD-10-CM | POA: Diagnosis present

## 2021-01-25 DIAGNOSIS — I700 Atherosclerosis of aorta: Secondary | ICD-10-CM | POA: Diagnosis present

## 2021-01-25 DIAGNOSIS — I6523 Occlusion and stenosis of bilateral carotid arteries: Secondary | ICD-10-CM | POA: Diagnosis present

## 2021-01-25 DIAGNOSIS — Z87891 Personal history of nicotine dependence: Secondary | ICD-10-CM

## 2021-01-25 DIAGNOSIS — N3090 Cystitis, unspecified without hematuria: Secondary | ICD-10-CM | POA: Diagnosis present

## 2021-01-25 DIAGNOSIS — E1142 Type 2 diabetes mellitus with diabetic polyneuropathy: Secondary | ICD-10-CM | POA: Diagnosis present

## 2021-01-25 DIAGNOSIS — Z8781 Personal history of (healed) traumatic fracture: Secondary | ICD-10-CM | POA: Diagnosis not present

## 2021-01-25 DIAGNOSIS — Z90710 Acquired absence of both cervix and uterus: Secondary | ICD-10-CM

## 2021-01-25 DIAGNOSIS — Z87440 Personal history of urinary (tract) infections: Secondary | ICD-10-CM

## 2021-01-25 DIAGNOSIS — E1122 Type 2 diabetes mellitus with diabetic chronic kidney disease: Secondary | ICD-10-CM | POA: Diagnosis present

## 2021-01-25 DIAGNOSIS — Z89421 Acquired absence of other right toe(s): Secondary | ICD-10-CM

## 2021-01-25 DIAGNOSIS — N321 Vesicointestinal fistula: Secondary | ICD-10-CM | POA: Diagnosis present

## 2021-01-25 DIAGNOSIS — Z9049 Acquired absence of other specified parts of digestive tract: Secondary | ICD-10-CM

## 2021-01-25 HISTORY — PX: LYSIS OF ADHESION: SHX5961

## 2021-01-25 LAB — GLUCOSE, CAPILLARY
Glucose-Capillary: 140 mg/dL — ABNORMAL HIGH (ref 70–99)
Glucose-Capillary: 185 mg/dL — ABNORMAL HIGH (ref 70–99)
Glucose-Capillary: 209 mg/dL — ABNORMAL HIGH (ref 70–99)
Glucose-Capillary: 211 mg/dL — ABNORMAL HIGH (ref 70–99)
Glucose-Capillary: 219 mg/dL — ABNORMAL HIGH (ref 70–99)
Glucose-Capillary: 235 mg/dL — ABNORMAL HIGH (ref 70–99)
Glucose-Capillary: 237 mg/dL — ABNORMAL HIGH (ref 70–99)
Glucose-Capillary: 284 mg/dL — ABNORMAL HIGH (ref 70–99)
Glucose-Capillary: 94 mg/dL (ref 70–99)

## 2021-01-25 LAB — BASIC METABOLIC PANEL
Anion gap: 9 (ref 5–15)
BUN: 25 mg/dL — ABNORMAL HIGH (ref 8–23)
CO2: 19 mmol/L — ABNORMAL LOW (ref 22–32)
Calcium: 7.6 mg/dL — ABNORMAL LOW (ref 8.9–10.3)
Chloride: 110 mmol/L (ref 98–111)
Creatinine, Ser: 1.76 mg/dL — ABNORMAL HIGH (ref 0.44–1.00)
GFR, Estimated: 30 mL/min — ABNORMAL LOW (ref >60)
Glucose, Bld: 239 mg/dL — ABNORMAL HIGH (ref 70–99)
Potassium: 4.6 mmol/L (ref 3.5–5.1)
Sodium: 138 mmol/L (ref 135–145)

## 2021-01-25 SURGERY — COLECTOMY, SIGMOID, ROBOT-ASSISTED
Anesthesia: General

## 2021-01-25 MED ORDER — CALCIUM CHLORIDE 10 % IV SOLN
INTRAVENOUS | Status: AC
  Filled 2021-01-25: qty 10

## 2021-01-25 MED ORDER — ORAL CARE MOUTH RINSE: 15.0000 mL | Freq: Once | OROMUCOSAL | Status: AC

## 2021-01-25 MED ORDER — HYDROCODONE-ACETAMINOPHEN 5-325 MG PO TABS
1.0000 | ORAL_TABLET | ORAL | Status: DC | PRN
  Administered 2021-01-25 – 2021-01-27 (×2): 1 via ORAL
  Filled 2021-01-25 (×2): qty 1

## 2021-01-25 MED ORDER — PROPOFOL 10 MG/ML IV BOLUS
INTRAVENOUS | Status: AC
  Filled 2021-01-25: qty 80

## 2021-01-25 MED ORDER — CELECOXIB 200 MG PO CAPS: 200.0000 mg | ORAL_CAPSULE | ORAL | Status: AC

## 2021-01-25 MED ORDER — ROCURONIUM BROMIDE 100 MG/10ML IV SOLN
INTRAVENOUS | Status: DC | PRN
  Administered 2021-01-25: 100 mg via INTRAVENOUS

## 2021-01-25 MED ORDER — GABAPENTIN 300 MG PO CAPS
ORAL_CAPSULE | ORAL | Status: AC
  Administered 2021-01-25: 300 mg via ORAL
  Filled 2021-01-25: qty 1

## 2021-01-25 MED ORDER — ROCURONIUM BROMIDE 10 MG/ML (PF) SYRINGE
PREFILLED_SYRINGE | INTRAVENOUS | Status: AC
  Filled 2021-01-25: qty 10

## 2021-01-25 MED ORDER — EPHEDRINE 5 MG/ML INJ
INTRAVENOUS | Status: AC
  Filled 2021-01-25: qty 10

## 2021-01-25 MED ORDER — INSULIN ASPART 100 UNIT/ML IJ SOLN
INTRAMUSCULAR | Status: AC
  Administered 2021-01-25: 5 [IU] via SUBCUTANEOUS
  Filled 2021-01-25: qty 1

## 2021-01-25 MED ORDER — LIDOCAINE HCL (CARDIAC) PF 100 MG/5ML IV SOSY
PREFILLED_SYRINGE | INTRAVENOUS | Status: DC | PRN
  Administered 2021-01-25: 80 mg via INTRAVENOUS

## 2021-01-25 MED ORDER — LIDOCAINE-PRILOCAINE 2.5-2.5 % EX CREA
1.0000 "application " | TOPICAL_CREAM | CUTANEOUS | Status: DC | PRN
  Filled 2021-01-25: qty 5

## 2021-01-25 MED ORDER — INSULIN ASPART 100 UNIT/ML IJ SOLN: 5.0000 [IU] | Freq: Once | INTRAMUSCULAR | Status: AC

## 2021-01-25 MED ORDER — ASPIRIN EC 81 MG PO TBEC
81.0000 mg | DELAYED_RELEASE_TABLET | Freq: Every day | ORAL | Status: DC
  Administered 2021-01-25 – 2021-01-28 (×4): 81 mg via ORAL
  Filled 2021-01-25 (×4): qty 1

## 2021-01-25 MED ORDER — PHENYLEPHRINE HCL-NACL 20-0.9 MG/250ML-% IV SOLN
INTRAVENOUS | Status: DC | PRN
  Administered 2021-01-25: 60 ug/min via INTRAVENOUS

## 2021-01-25 MED ORDER — MIRTAZAPINE 15 MG PO TABS
15.0000 mg | ORAL_TABLET | Freq: Every day | ORAL | Status: DC | PRN
  Administered 2021-01-27: 21:00:00 15 mg via ORAL
  Filled 2021-01-25: qty 1

## 2021-01-25 MED ORDER — CHLORHEXIDINE GLUCONATE CLOTH 2 % EX PADS
6.0000 | MEDICATED_PAD | Freq: Every day | CUTANEOUS | Status: DC
  Administered 2021-01-25 – 2021-01-28 (×4): 6 via TOPICAL

## 2021-01-25 MED ORDER — FUROSEMIDE 20 MG PO TABS
20.0000 mg | ORAL_TABLET | ORAL | Status: DC
  Administered 2021-01-27: 09:00:00 20 mg via ORAL
  Filled 2021-01-25: qty 1

## 2021-01-25 MED ORDER — CHLORHEXIDINE GLUCONATE 0.12 % MT SOLN: 15.0000 mL | Freq: Once | OROMUCOSAL | Status: AC

## 2021-01-25 MED ORDER — DEXAMETHASONE SODIUM PHOSPHATE 10 MG/ML IJ SOLN
INTRAMUSCULAR | Status: AC
  Filled 2021-01-25: qty 1

## 2021-01-25 MED ORDER — CALCIUM CHLORIDE 10 % IV SOLN
INTRAVENOUS | Status: DC | PRN
  Administered 2021-01-25 (×2): 100 mg via INTRAVENOUS
  Administered 2021-01-25: 200 mg via INTRAVENOUS
  Administered 2021-01-25: 100 mg via INTRAVENOUS
  Administered 2021-01-25: 200 mg via INTRAVENOUS

## 2021-01-25 MED ORDER — HYDROMORPHONE HCL 1 MG/ML IJ SOLN: 0.5000 mg | INTRAMUSCULAR | Status: DC | PRN

## 2021-01-25 MED ORDER — ESCITALOPRAM OXALATE 10 MG PO TABS
10.0000 mg | ORAL_TABLET | Freq: Every day | ORAL | Status: DC
  Administered 2021-01-26 – 2021-01-28 (×3): 10 mg via ORAL
  Filled 2021-01-25 (×3): qty 1

## 2021-01-25 MED ORDER — SODIUM CHLORIDE 0.9 % IV SOLN: INTRAVENOUS | Status: DC

## 2021-01-25 MED ORDER — PROPOFOL 10 MG/ML IV BOLUS
INTRAVENOUS | Status: DC | PRN
  Administered 2021-01-25: 50 mg via INTRAVENOUS
  Administered 2021-01-25: 100 mg via INTRAVENOUS

## 2021-01-25 MED ORDER — CHLORHEXIDINE GLUCONATE CLOTH 2 % EX PADS: 6.0000 | MEDICATED_PAD | Freq: Once | CUTANEOUS | Status: DC

## 2021-01-25 MED ORDER — ONDANSETRON HCL 4 MG/2ML IJ SOLN
INTRAMUSCULAR | Status: AC
  Filled 2021-01-25: qty 2

## 2021-01-25 MED ORDER — ACETAMINOPHEN 10 MG/ML IV SOLN: 1000.0000 mg | Freq: Once | INTRAVENOUS | Status: DC | PRN

## 2021-01-25 MED ORDER — VITAMIN D 25 MCG (1000 UNIT) PO TABS
1000.0000 [IU] | ORAL_TABLET | Freq: Two times a day (BID) | ORAL | Status: DC
  Administered 2021-01-25 – 2021-01-28 (×6): 1000 [IU] via ORAL
  Filled 2021-01-25 (×6): qty 1

## 2021-01-25 MED ORDER — INDOCYANINE GREEN 25 MG IV SOLR: INTRAVENOUS | Status: DC | PRN

## 2021-01-25 MED ORDER — ENSURE PRE-SURGERY PO LIQD
296.0000 mL | Freq: Once | ORAL | Status: DC
  Filled 2021-01-25: qty 296

## 2021-01-25 MED ORDER — VITAMIN B-12 1000 MCG PO TABS
1000.0000 ug | ORAL_TABLET | Freq: Every day | ORAL | Status: DC
  Administered 2021-01-26 – 2021-01-28 (×3): 1000 ug via ORAL
  Filled 2021-01-25 (×3): qty 1

## 2021-01-25 MED ORDER — SODIUM CHLORIDE 0.9 % IV SOLN
INTRAVENOUS | Status: AC
  Administered 2021-01-25: 2 g via INTRAVENOUS
  Filled 2021-01-25: qty 2

## 2021-01-25 MED ORDER — ALVIMOPAN 12 MG PO CAPS
ORAL_CAPSULE | ORAL | Status: AC
  Administered 2021-01-25: 12 mg via ORAL
  Filled 2021-01-25: qty 1

## 2021-01-25 MED ORDER — CLINDAMYCIN PHOSPHATE 900 MG/50ML IV SOLN
INTRAVENOUS | Status: DC | PRN
  Administered 2021-01-25: 900 mg via INTRAVENOUS

## 2021-01-25 MED ORDER — INSULIN ASPART 100 UNIT/ML IJ SOLN
INTRAMUSCULAR | Status: AC
  Administered 2021-01-25: 8 [IU] via SUBCUTANEOUS
  Filled 2021-01-25: qty 1

## 2021-01-25 MED ORDER — INSULIN ASPART 100 UNIT/ML IJ SOLN
0.0000 [IU] | INTRAMUSCULAR | Status: DC
  Administered 2021-01-26 (×2): 3 [IU] via SUBCUTANEOUS
  Administered 2021-01-26: 01:00:00 5 [IU] via SUBCUTANEOUS
  Administered 2021-01-27: 12:00:00 3 [IU] via SUBCUTANEOUS
  Administered 2021-01-27: 17:00:00 5 [IU] via SUBCUTANEOUS
  Administered 2021-01-27: 01:00:00 2 [IU] via SUBCUTANEOUS
  Administered 2021-01-27 – 2021-01-28 (×2): 3 [IU] via SUBCUTANEOUS
  Administered 2021-01-28: 2 [IU] via SUBCUTANEOUS
  Filled 2021-01-25 (×12): qty 1

## 2021-01-25 MED ORDER — SPY AGENT GREEN - (INDOCYANINE FOR INJECTION)
INTRAMUSCULAR | Status: DC | PRN
  Administered 2021-01-25: 5 mg via INTRAVENOUS

## 2021-01-25 MED ORDER — BUPIVACAINE-EPINEPHRINE (PF) 0.25% -1:200000 IJ SOLN
INTRAMUSCULAR | Status: AC
  Filled 2021-01-25: qty 30

## 2021-01-25 MED ORDER — SODIUM CHLORIDE 0.9 % IV SOLN
2.0000 g | INTRAVENOUS | Status: AC
  Administered 2021-01-25: 2 g via INTRAVENOUS

## 2021-01-25 MED ORDER — APREPITANT 40 MG PO CAPS
ORAL_CAPSULE | ORAL | Status: AC
  Filled 2021-01-25: qty 1

## 2021-01-25 MED ORDER — ONDANSETRON HCL 4 MG/2ML IJ SOLN: 4.0000 mg | Freq: Once | INTRAMUSCULAR | Status: DC | PRN

## 2021-01-25 MED ORDER — KETAMINE HCL 10 MG/ML IJ SOLN
INTRAMUSCULAR | Status: DC | PRN
  Administered 2021-01-25: 20 mg via INTRAVENOUS

## 2021-01-25 MED ORDER — CLINDAMYCIN PHOSPHATE 900 MG/50ML IV SOLN
INTRAVENOUS | Status: AC
  Filled 2021-01-25: qty 50

## 2021-01-25 MED ORDER — ASCORBIC ACID 500 MG PO TABS
250.0000 mg | ORAL_TABLET | Freq: Every day | ORAL | Status: DC
  Administered 2021-01-26 – 2021-01-28 (×3): 250 mg via ORAL
  Filled 2021-01-25 (×3): qty 1

## 2021-01-25 MED ORDER — SODIUM CHLORIDE 0.9 % IV SOLN
2.0000 g | Freq: Two times a day (BID) | INTRAVENOUS | Status: AC
  Filled 2021-01-25: qty 2

## 2021-01-25 MED ORDER — EPHEDRINE SULFATE (PRESSORS) 50 MG/ML IJ SOLN
INTRAMUSCULAR | Status: DC | PRN
  Administered 2021-01-25: 10 mg via INTRAVENOUS

## 2021-01-25 MED ORDER — ONDANSETRON 4 MG PO TBDP: 4.0000 mg | ORAL_TABLET | Freq: Four times a day (QID) | ORAL | Status: DC | PRN

## 2021-01-25 MED ORDER — HEPARIN SODIUM (PORCINE) 5000 UNIT/ML IJ SOLN
5000.0000 [IU] | Freq: Three times a day (TID) | INTRAMUSCULAR | Status: DC
  Administered 2021-01-26 – 2021-01-28 (×7): 5000 [IU] via SUBCUTANEOUS
  Filled 2021-01-25 (×7): qty 1

## 2021-01-25 MED ORDER — CELECOXIB 200 MG PO CAPS
ORAL_CAPSULE | ORAL | Status: AC
  Administered 2021-01-25: 200 mg via ORAL
  Filled 2021-01-25: qty 1

## 2021-01-25 MED ORDER — OXYCODONE HCL 5 MG PO TABS: 5.0000 mg | ORAL_TABLET | Freq: Once | ORAL | Status: DC | PRN

## 2021-01-25 MED ORDER — KETAMINE HCL 50 MG/5ML IJ SOSY
PREFILLED_SYRINGE | INTRAMUSCULAR | Status: AC
  Filled 2021-01-25: qty 5

## 2021-01-25 MED ORDER — FENTANYL CITRATE (PF) 100 MCG/2ML IJ SOLN
INTRAMUSCULAR | Status: AC
  Filled 2021-01-25: qty 2

## 2021-01-25 MED ORDER — FAMOTIDINE 20 MG PO TABS
40.0000 mg | ORAL_TABLET | Freq: Every day | ORAL | Status: DC
  Administered 2021-01-26 – 2021-01-28 (×3): 40 mg via ORAL
  Filled 2021-01-25 (×3): qty 2

## 2021-01-25 MED ORDER — BUPIVACAINE LIPOSOME 1.3 % IJ SUSP: 20.0000 mL | Freq: Once | INTRAMUSCULAR | Status: DC

## 2021-01-25 MED ORDER — PHENYLEPHRINE HCL (PRESSORS) 10 MG/ML IV SOLN
INTRAVENOUS | Status: AC
  Filled 2021-01-25: qty 1

## 2021-01-25 MED ORDER — FENTANYL CITRATE (PF) 100 MCG/2ML IJ SOLN
INTRAMUSCULAR | Status: DC | PRN
  Administered 2021-01-25: 50 ug via INTRAVENOUS
  Administered 2021-01-25: 100 ug via INTRAVENOUS

## 2021-01-25 MED ORDER — FENTANYL CITRATE (PF) 100 MCG/2ML IJ SOLN: 25.0000 ug | INTRAMUSCULAR | Status: DC | PRN

## 2021-01-25 MED ORDER — SODIUM CHLORIDE 0.9 % IR SOLN
Status: DC | PRN
  Administered 2021-01-25: 3000 mL

## 2021-01-25 MED ORDER — ACETAMINOPHEN 500 MG PO TABS: 1000.0000 mg | ORAL_TABLET | ORAL | Status: AC

## 2021-01-25 MED ORDER — LEVOTHYROXINE SODIUM 100 MCG PO TABS
100.0000 ug | ORAL_TABLET | Freq: Every day | ORAL | Status: DC
  Administered 2021-01-26 – 2021-01-28 (×3): 100 ug via ORAL
  Filled 2021-01-25 (×3): qty 1

## 2021-01-25 MED ORDER — GABAPENTIN 300 MG PO CAPS: 300.0000 mg | ORAL_CAPSULE | ORAL | Status: AC

## 2021-01-25 MED ORDER — METOPROLOL SUCCINATE ER 50 MG PO TB24
50.0000 mg | ORAL_TABLET | Freq: Every day | ORAL | Status: DC
  Administered 2021-01-26 – 2021-01-28 (×3): 50 mg via ORAL
  Filled 2021-01-25 (×3): qty 1

## 2021-01-25 MED ORDER — BUPIVACAINE-EPINEPHRINE (PF) 0.25% -1:200000 IJ SOLN
INTRAMUSCULAR | Status: DC | PRN
  Administered 2021-01-25: 30 mL

## 2021-01-25 MED ORDER — PHENYLEPHRINE HCL (PRESSORS) 10 MG/ML IV SOLN
INTRAVENOUS | Status: DC | PRN
  Administered 2021-01-25 (×2): 200 ug via INTRAVENOUS

## 2021-01-25 MED ORDER — ZOLPIDEM TARTRATE 5 MG PO TABS
5.0000 mg | ORAL_TABLET | Freq: Every day | ORAL | Status: DC
  Administered 2021-01-25 – 2021-01-27 (×3): 5 mg via ORAL
  Filled 2021-01-25 (×3): qty 1

## 2021-01-25 MED ORDER — LIDOCAINE HCL (PF) 2 % IJ SOLN
INTRAMUSCULAR | Status: AC
  Filled 2021-01-25: qty 5

## 2021-01-25 MED ORDER — DEXAMETHASONE SODIUM PHOSPHATE 10 MG/ML IJ SOLN
INTRAMUSCULAR | Status: DC | PRN
  Administered 2021-01-25: 4 mg via INTRAVENOUS

## 2021-01-25 MED ORDER — APREPITANT 40 MG PO CAPS
40.0000 mg | ORAL_CAPSULE | Freq: Once | ORAL | Status: AC
  Administered 2021-01-25: 40 mg via ORAL

## 2021-01-25 MED ORDER — ENSURE PRE-SURGERY PO LIQD
592.0000 mL | Freq: Once | ORAL | Status: DC
  Filled 2021-01-25: qty 592

## 2021-01-25 MED ORDER — PANTOPRAZOLE SODIUM 40 MG PO TBEC
40.0000 mg | DELAYED_RELEASE_TABLET | ORAL | Status: DC
  Administered 2021-01-26 – 2021-01-28 (×3): 40 mg via ORAL
  Filled 2021-01-25 (×3): qty 1

## 2021-01-25 MED ORDER — CHLORHEXIDINE GLUCONATE 0.12 % MT SOLN
OROMUCOSAL | Status: AC
  Administered 2021-01-25: 15 mL via OROMUCOSAL
  Filled 2021-01-25: qty 15

## 2021-01-25 MED ORDER — ALVIMOPAN 12 MG PO CAPS: 12.0000 mg | ORAL_CAPSULE | ORAL | Status: AC

## 2021-01-25 MED ORDER — INSULIN ASPART 100 UNIT/ML IJ SOLN
INTRAMUSCULAR | Status: DC | PRN
Start: 2021-01-25 — End: 2021-01-25
  Administered 2021-01-25: 5 [IU] via SUBCUTANEOUS

## 2021-01-25 MED ORDER — ONDANSETRON HCL 4 MG/2ML IJ SOLN: 4.0000 mg | Freq: Four times a day (QID) | INTRAMUSCULAR | Status: DC | PRN

## 2021-01-25 MED ORDER — ONDANSETRON HCL 4 MG/2ML IJ SOLN
INTRAMUSCULAR | Status: DC | PRN
  Administered 2021-01-25: 4 mg via INTRAVENOUS

## 2021-01-25 MED ORDER — MAGNESIUM OXIDE -MG SUPPLEMENT 400 (240 MG) MG PO TABS
400.0000 mg | ORAL_TABLET | ORAL | Status: DC
  Administered 2021-01-26 – 2021-01-28 (×3): 400 mg via ORAL
  Filled 2021-01-25 (×3): qty 1

## 2021-01-25 MED ORDER — INDOCYANINE GREEN 25 MG IV SOLR
INTRAVENOUS | Status: DC | PRN
Start: 2021-01-25 — End: 2021-01-25
  Administered 2021-01-25: 20 mg

## 2021-01-25 MED ORDER — ACETAMINOPHEN 500 MG PO TABS
ORAL_TABLET | ORAL | Status: AC
  Administered 2021-01-25: 1000 mg via ORAL
  Filled 2021-01-25: qty 2

## 2021-01-25 MED ORDER — SODIUM CHLORIDE 0.9 % IV SOLN: INTRAVENOUS | Status: DC | PRN

## 2021-01-25 MED ORDER — OXYCODONE HCL 5 MG/5ML PO SOLN: 5.0000 mg | Freq: Once | ORAL | Status: DC | PRN

## 2021-01-25 MED ORDER — SODIUM CHLORIDE 0.9 % IV SOLN
INTRAVENOUS | Status: DC
  Filled 2021-01-25: qty 6

## 2021-01-25 MED ORDER — ROSUVASTATIN CALCIUM 10 MG PO TABS
20.0000 mg | ORAL_TABLET | ORAL | Status: DC
Start: 2021-01-26 — End: 2021-01-28
  Administered 2021-01-26 – 2021-01-28 (×3): 20 mg via ORAL
  Filled 2021-01-25 (×3): qty 2

## 2021-01-25 MED ORDER — SUGAMMADEX SODIUM 200 MG/2ML IV SOLN
INTRAVENOUS | Status: DC | PRN
  Administered 2021-01-25: 200 mg via INTRAVENOUS

## 2021-01-25 MED ORDER — MORPHINE SULFATE (PF) 2 MG/ML IV SOLN: 2.0000 mg | INTRAVENOUS | Status: DC | PRN

## 2021-01-25 MED ORDER — AMLODIPINE BESYLATE 5 MG PO TABS
5.0000 mg | ORAL_TABLET | Freq: Two times a day (BID) | ORAL | Status: DC
  Administered 2021-01-25 – 2021-01-28 (×5): 5 mg via ORAL
  Filled 2021-01-25 (×5): qty 1

## 2021-01-25 MED ORDER — ALPRAZOLAM 0.25 MG PO TABS: 0.2500 mg | ORAL_TABLET | Freq: Every day | ORAL | Status: DC | PRN

## 2021-01-25 MED ORDER — BUPIVACAINE LIPOSOME 1.3 % IJ SUSP
INTRAMUSCULAR | Status: AC
  Filled 2021-01-25: qty 20

## 2021-01-25 MED ORDER — NYSTATIN 100000 UNIT/GM EX CREA
1.0000 "application " | TOPICAL_CREAM | Freq: Every day | CUTANEOUS | Status: DC | PRN
  Filled 2021-01-25: qty 15

## 2021-01-25 SURGICAL SUPPLY — 104 items
BAG DRAIN CYSTO-URO LG1000N (MISCELLANEOUS) ×3 IMPLANT
BAG LAPAROSCOPIC 12 15 PORT 16 (BASKET) IMPLANT
BAG RETRIEVAL 12/15 (BASKET)
BLADE SURG SZ10 CARB STEEL (BLADE) ×3 IMPLANT
CANNULA CAP OBTURATR AIRSEAL 8 (CAP) ×3 IMPLANT
CANNULA REDUC XI 12-8 STAPL (CANNULA) ×1
CANNULA REDUCER 12-8 DVNC XI (CANNULA) ×2 IMPLANT
CATH FOLEY 2WAY  5CC 16FR (CATHETERS) ×1
CATH ROBINSON RED A/P 16FR (CATHETERS) ×3 IMPLANT
CATH URETL OPEN 5X70 (CATHETERS) ×3 IMPLANT
CATH URTH 16FR FL 2W BLN LF (CATHETERS) ×2 IMPLANT
COVER TIP SHEARS 8 DVNC (MISCELLANEOUS) ×2 IMPLANT
COVER TIP SHEARS 8MM DA VINCI (MISCELLANEOUS) ×1
DECANTER SPIKE VIAL GLASS SM (MISCELLANEOUS) ×3 IMPLANT
DERMABOND ADVANCED (GAUZE/BANDAGES/DRESSINGS) ×1
DERMABOND ADVANCED .7 DNX12 (GAUZE/BANDAGES/DRESSINGS) ×2 IMPLANT
DRAPE ARM DVNC X/XI (DISPOSABLE) ×8 IMPLANT
DRAPE COLUMN DVNC XI (DISPOSABLE) ×2 IMPLANT
DRAPE DA VINCI XI ARM (DISPOSABLE) ×4
DRAPE DA VINCI XI COLUMN (DISPOSABLE) ×1
DRAPE LEGGINS SURG 28X43 STRL (DRAPES) ×3 IMPLANT
DRAPE UNDER BUTTOCK W/FLU (DRAPES) ×3 IMPLANT
ELECT CAUTERY BLADE 6.4 (BLADE) ×3 IMPLANT
ELECT REM PT RETURN 9FT ADLT (ELECTROSURGICAL) ×3
ELECTRODE REM PT RTRN 9FT ADLT (ELECTROSURGICAL) ×2 IMPLANT
GAUZE 4X4 16PLY ~~LOC~~+RFID DBL (SPONGE) ×6 IMPLANT
GELPOINT ADV PLATFORM (ENDOMECHANICALS)
GLOVE SURG ORTHO LTX SZ7.5 (GLOVE) ×12 IMPLANT
GLOVE SURG SYN 6.5 ES PF (GLOVE) ×3 IMPLANT
GLOVE SURG SYN 6.5 PF PI (GLOVE) ×2 IMPLANT
GOWN STRL REUS W/ TWL LRG LVL3 (GOWN DISPOSABLE) ×14 IMPLANT
GOWN STRL REUS W/TWL LRG LVL3 (GOWN DISPOSABLE) ×7
GRASPER SUT TROCAR 14GX15 (MISCELLANEOUS) IMPLANT
HANDLE YANKAUER SUCT BULB TIP (MISCELLANEOUS) IMPLANT
IRRIGATION STRYKERFLOW (MISCELLANEOUS) IMPLANT
IRRIGATOR STRYKERFLOW (MISCELLANEOUS) ×3
IV NS 1000ML (IV SOLUTION) ×1
IV NS 1000ML BAXH (IV SOLUTION) ×2 IMPLANT
IV NS IRRIG 3000ML ARTHROMATIC (IV SOLUTION) ×5 IMPLANT
KIT IMAGING PINPOINTPAQ (MISCELLANEOUS) ×9 IMPLANT
KIT PINK PAD W/HEAD ARE REST (MISCELLANEOUS) ×3
KIT PINK PAD W/HEAD ARM REST (MISCELLANEOUS) ×2 IMPLANT
KIT TURNOVER KIT A (KITS) ×3 IMPLANT
MANIFOLD NEPTUNE II (INSTRUMENTS) ×6 IMPLANT
NDL INSUFFLATION 14GA 120MM (NEEDLE) ×2 IMPLANT
NEEDLE HYPO 22GX1.5 SAFETY (NEEDLE) ×3 IMPLANT
NEEDLE INSUFFLATION 14GA 120MM (NEEDLE) ×3 IMPLANT
NS IRRIG 500ML POUR BTL (IV SOLUTION) ×3 IMPLANT
PACK COLON CLEAN CLOSURE (MISCELLANEOUS) ×2 IMPLANT
PACK CYSTO AR (MISCELLANEOUS) ×3 IMPLANT
PACK LAP CHOLECYSTECTOMY (MISCELLANEOUS) ×3 IMPLANT
PAD PREP 24X41 OB/GYN DISP (PERSONAL CARE ITEMS) ×3 IMPLANT
PENCIL ELECTRO HAND CTR (MISCELLANEOUS) ×3 IMPLANT
PLATFORM STD W/COL CELL SVR (ENDOMECHANICALS) IMPLANT
RELOAD STAPLE 45 3.5 BLU DVNC (STAPLE) IMPLANT
RELOAD STAPLE 60 3.5 BLU DVNC (STAPLE) IMPLANT
RELOAD STAPLER 3.5X45 BLU DVNC (STAPLE) IMPLANT
RELOAD STAPLER 3.5X60 BLU DVNC (STAPLE) IMPLANT
RETRACTOR WOUND ALXS 18CM MED (MISCELLANEOUS) IMPLANT
RTRCTR WOUND ALEXIS O 18CM MED (MISCELLANEOUS)
SCISSORS METZENBAUM CVD 33 (INSTRUMENTS) IMPLANT
SEAL CANN UNIV 5-8 DVNC XI (MISCELLANEOUS) ×4 IMPLANT
SEAL XI 5MM-8MM UNIVERSAL (MISCELLANEOUS) ×2
SEALER VESSEL DA VINCI XI (MISCELLANEOUS) ×1
SEALER VESSEL EXT DVNC XI (MISCELLANEOUS) IMPLANT
SET CYSTO W/LG BORE CLAMP LF (SET/KITS/TRAYS/PACK) ×3 IMPLANT
SET TUBE FILTERED XL AIRSEAL (SET/KITS/TRAYS/PACK) ×3 IMPLANT
SOL PREP PVP 2OZ (MISCELLANEOUS)
SOLUTION ELECTROLUBE (MISCELLANEOUS) ×3 IMPLANT
SOLUTION PREP PVP 2OZ (MISCELLANEOUS) ×2 IMPLANT
SPONGE T-LAP 18X18 ~~LOC~~+RFID (SPONGE) ×3 IMPLANT
STAPLER 45 DA VINCI SURE FORM (STAPLE)
STAPLER 45 SUREFORM DVNC (STAPLE) IMPLANT
STAPLER 60 DA VINCI SURE FORM (STAPLE)
STAPLER 60 SUREFORM DVNC (STAPLE) IMPLANT
STAPLER CANNULA SEAL DVNC XI (STAPLE) ×2 IMPLANT
STAPLER CANNULA SEAL XI (STAPLE) ×1
STAPLER CIRCULAR MANUAL XL 33 (STAPLE) ×1 IMPLANT
STAPLER RELOAD 3.5X45 BLU DVNC (STAPLE)
STAPLER RELOAD 3.5X45 BLUE (STAPLE)
STAPLER RELOAD 3.5X60 BLU DVNC (STAPLE)
STAPLER RELOAD 3.5X60 BLUE (STAPLE)
STAPLER SKIN PROX 35W (STAPLE) IMPLANT
SURGILUBE 2OZ TUBE FLIPTOP (MISCELLANEOUS) ×6 IMPLANT
SUT DVC VLOC 90 3-0 CV23 VLT (SUTURE) ×3
SUT MNCRL 4-0 (SUTURE) ×2
SUT MNCRL 4-0 27XMFL (SUTURE) ×4
SUT PDS AB 0 CT1 27 (SUTURE) ×3 IMPLANT
SUT VIC AB 0 SH 27 (SUTURE) IMPLANT
SUT VIC AB 3-0 SH 27 (SUTURE) ×2
SUT VIC AB 3-0 SH 27X BRD (SUTURE) ×4 IMPLANT
SUTURE DVC VLC 90 3-0 CV23 VLT (SUTURE) ×2 IMPLANT
SUTURE MNCRL 4-0 27XMF (SUTURE) ×4 IMPLANT
SYR 10ML LL (SYRINGE) ×6 IMPLANT
SYR 20ML LL LF (SYRINGE) ×6 IMPLANT
SYR TOOMEY IRRIG 70ML (MISCELLANEOUS) ×3
SYRINGE TOOMEY IRRIG 70ML (MISCELLANEOUS) ×2 IMPLANT
SYS TROCAR 1.5-3 SLV ABD GEL (ENDOMECHANICALS)
SYSTEM TROCR 1.5-3 SLV ABD GEL (ENDOMECHANICALS) IMPLANT
TRAY FOLEY MTR SLVR 16FR STAT (SET/KITS/TRAYS/PACK) ×3 IMPLANT
TROCAR Z-THREAD FIOS 11X100 BL (TROCAR) IMPLANT
TROCAR Z-THREAD OPTICAL 5X100M (TROCAR) IMPLANT
WATER STERILE IRR 1000ML POUR (IV SOLUTION) ×3 IMPLANT
WATER STERILE IRR 500ML POUR (IV SOLUTION) ×6 IMPLANT

## 2021-01-25 NOTE — Interval H&P Note (Signed)
History and Physical Interval Note:  01/25/2021 7:11 AM  Cheryl Leonard  has presented today for surgery, with the diagnosis of colovesical fistula.  The various methods of treatment have been discussed with the patient and family. After consideration of risks, benefits and other options for treatment, the patient has consented to  Procedure(s): XI ROBOT ASSISTED SIGMOID COLECTOMY, possible splenic flexure mobility (N/A) CYSTOSCOPY WITH ICG (N/A) as a surgical intervention.  The patient's history has been reviewed, patient examined, no change in status, stable for surgery.  I have reviewed the patient's chart and labs.  Questions were answered to the patient's satisfaction.     Ronny Bacon

## 2021-01-25 NOTE — Interval H&P Note (Signed)
Patient seen and examined on the day of the procedure.  Probable colovesical fistula undergoing colectomy Dr. Zoila Shutter.  I was asked to participate with cystoscopy and injection of ICG for ureteral identification.  All questions were answered.  Risk including risk of blood in the urine, urinary tract infection, damage surrounding structures were discussed.  RRR CTA B  Completed course of antibiotics last week.

## 2021-01-25 NOTE — Anesthesia Preprocedure Evaluation (Signed)
Anesthesia Evaluation  Patient identified by MRN, date of birth, ID band Patient awake    Reviewed: Allergy & Precautions, NPO status , Patient's Chart, lab work & pertinent test results  History of Anesthesia Complications Negative for: history of anesthetic complications  Airway Mallampati: II  TM Distance: <3 FB Neck ROM: Full    Dental  (+) Upper Dentures, Lower Dentures   Pulmonary sleep apnea , neg COPD, former smoker,    breath sounds clear to auscultation- rhonchi (-) wheezing      Cardiovascular hypertension, Pt. on medications + Peripheral Vascular Disease  (-) CAD, (-) Past MI, (-) Cardiac Stents and (-) CABG  Rhythm:Regular Rate:Normal - Systolic murmurs and - Diastolic murmurs    Neuro/Psych  Headaches, neg Seizures PSYCHIATRIC DISORDERS Anxiety   Macular degeneration. Receives ocular medication injections. Denies any gas bubble injections    GI/Hepatic GERD  Medicated,  Endo/Other  diabetes, Well Controlled, Insulin DependentHypothyroidism   Renal/GU Renal InsufficiencyRenal disease     Musculoskeletal  (+) Arthritis ,   Abdominal (+) - obese,   Peds  Hematology  (+) Blood dyscrasia, anemia ,   Anesthesia Other Findings Past Medical History: No date: Anemia No date: Anxiety No date: Arthritis     Comment:  Gout No date: Cataracts, both eyes No date: Diabetic retinopathy (HCC)     Comment:  NPDR OU No date: Diabetic retinopathy (HCC) No date: GERD (gastroesophageal reflux disease) No date: Gout No date: Headache     Comment:  h/o migraines No date: History of fracture of patella     Comment:  right knee No date: History of positive PPD     Comment:  Patient always shows positive No date: Hyperlipidemia No date: Hypertension No date: Hypertensive retinopathy     Comment:  OU No date: Hypothyroidism [id number]: Lichen sclerosus     Comment:  of vulva No date: Metatarsal fracture No  date: Neuropathy No date: Peripheral vascular disease (HCC) No date: Polyneuropathy     Comment:  numbness and tingling in feet and toes No date: Renal insufficiency     Comment:  Stage 3 No date: Sleep apnea     Comment:  does not use cpap-lost weight  No date: Type 2 diabetes mellitus, uncontrolled (HCC)   Reproductive/Obstetrics                             Anesthesia Physical  Anesthesia Plan  ASA: 3  Anesthesia Plan: General   Post-op Pain Management: Tylenol PO (pre-op), Celebrex PO (pre-op), Gabapentin PO (pre-op) and Ketamine IV   Induction: Intravenous  PONV Risk Score and Plan: 4 or greater and Ondansetron, Dexamethasone, Aprepitant and Treatment may vary due to age or medical condition  Airway Management Planned: Oral ETT  Additional Equipment: None  Intra-op Plan:   Post-operative Plan: Extubation in OR  Informed Consent: I have reviewed the patients History and Physical, chart, labs and discussed the procedure including the risks, benefits and alternatives for the proposed anesthesia with the patient or authorized representative who has indicated his/her understanding and acceptance.     Dental advisory given  Plan Discussed with: CRNA and Anesthesiologist  Anesthesia Plan Comments: (Discussed risks of anesthesia with patient, including PONV, sore throat, lip/dental/eye damage, post operative cognitive dysfunction. Rare risks discussed as well, such as cardiorespiratory and neurological sequelae, and allergic reactions. Discussed the role of CRNA in patient's perioperative care. Patient understands.)  Anesthesia Quick Evaluation

## 2021-01-25 NOTE — Progress Notes (Signed)
Pt BP has increased into the 100's over 60's. Dr. Karlton Lemon notified. Dr. Rip Harbour for patient to now go to her room.

## 2021-01-25 NOTE — Progress Notes (Addendum)
Pt BP 74/46. Pt calcium is 7.6. Pt CBG 211. Dr. Wynetta Emery notified. Acknowledged. Will come to assess the patient.

## 2021-01-25 NOTE — Op Note (Signed)
Robotic assisted laparoscopic extensive lysis of pelvic adhesions, sigmoid colectomy with colorectal anastomosis.  Pre-operative Diagnosis: Colovesical fistula, secondary to diverticulitis.  Post-operative Diagnosis: same.  With extensive pelvic adhesions from prior hysterectomy.  Surgeon: Campbell Lerner, M.D., FACS  Assistant: Lincoln Brigham, PA-C. (a first assist was necessary due to the technical complexity of the case as well as the need for bedside assistance for creation of the EEA anastomosis, and natural orifice extraction.)  Anesthesia: General endotracheal  Findings: Markedly redundant sigmoid colon with rectosigmoid junction extensively adherent with adjacent small bowel, omentum throughout the entirety of the pelvis and pelvic outlet.  Estimated Blood Loss: 50 mL         Specimens: Sigmoid colon with proximal rectum.          Complications: none              Procedure Details  The patient was seen again in the Holding Room. The benefits, complications, treatment options, and expected outcomes were discussed with the patient. The risks of bleeding, infection, recurrence of symptoms, failure to resolve symptoms, unanticipated injury, prosthetic placement, prosthetic infection, any of which could require further surgery were reviewed with the patient. The likelihood of improving the patient's symptoms with return to their baseline status is anticipated.  The patient and/or family concurred with the proposed plan, giving informed consent.  The patient was taken to Operating Room, identified and the procedure verified.    Prior to the induction of general anesthesia, antibiotic prophylaxis was administered. VTE prophylaxis was in place.  General anesthesia was then administered and tolerated well.  Dr. Apolinar Junes completed the cystoscopy, with ICG instillation of the bilateral ureters. After the induction, the patient had been positioned in the lithotomy position and the abdomen and  perineum was prepped with Chloraprep and Betadine and draped in the sterile fashion.  A Time Out was held and the above information confirmed.  After local infiltration of quarter percent Marcaine with epinephrine, stab incision was made left upper quadrant.  Just below the costal margin approximately midclavicular line the Veress needle is passed with sensation of the layers to penetrate the abdominal wall and into the peritoneum.  Saline drop test is confirmed peritoneal placement.  Insufflation is initiated with carbon dioxide to pressures of 15 mmHg.   An optical 11 mm trocar is placed under direct visualization into the peritoneal cavity in the right lower quadrant.  Under direct visualization three 8-1/2 mm robotic trochars were placed on a straight line extending to the left costal margin.  Then under direct visualization I swapped out the 11 mm trocar for a 12 mm robotic trocar.  We then docked the robot from the patient's left, utilizing a tips up fenestrated grasper, a forced bipolar, and monopolar scissors we proceeded with the lysis of adhesions, this went on for a minimum of an hour and a half, potentially to full hours. The sigmoid colon was redundant stuck to the anterior pelvis, the left lower quadrant, the omentum was adhesed to it, multiple loops of small bowel were stuck to each other and to the pelvic sidewalls where the sigmoid colon was not.  Omentum was also adherent to the cecum/ileocecal junction. Once all of these adhesions were taken down, I was then able to restore anatomy for the sigmoid colon which allowed [place] to retract it anteriorly, and identify the junction of the mesorectum and the retroperitoneum.  I then scored the peritoneum at this point and began the retroperitoneal dissection.  During the lysis  of adhesions I was able to readily identify the left ureter.  Subsequently the right ureter was also well identified in its pelvic course. Beginning the dissection between the  mesocolon and the retroperitoneum, I was able to isolate the IMA.  At its origin, I utilized the vessel sealer applied multiple applications before dividing it.  This allowed me to grasp this pedicle and continue the medial to lateral retromesocolonic dissection.  I then proceeded with lateral dissection once this was completed thus freeing the descending colon and sigmoid colon from the left abdominal wall, left lower quadrant. Further dissection was then completed along the mesorectum mobilizing the rectosigmoid junction and the proximal rectum distally. The proximal rectum was somewhat redundant.  Once I brought it down to an adequate distance to allow for natural orifice extraction, and for a straight shot at EEA anastomosis.  I then scored the rectum and the mesorectum, divided it with the vessel sealer circumferentially and proceeded with dividing the rectum with the vessel sealer. The mobilized sigmoid colon was then laid into the pelvis to assure I would retain an adequate length for anastomosis, and selected the proximal division site.  I then divided the mesorectum with vessel sealer starting at the IMA origin and progressing towards the point chosen on the descending colon.  Once I completed the devascularization at that point, an injection of 5 mg of ICG was given and confirmed that my division point would be well vascularized. Then divided the colon at that point with the vessel sealer, and then we extracted it through the rectum.  Lincoln Brigham presented to assist me in this way.  We then chose the 33 mm EEA stapler after confirming adequate rectal size. We passed the anvil via the rectum, secured it in the distal aspect of the descending colon with a running pursestring suture of 3-0 V-Loc. We then did the same with the rectal cuff around the stapler with its spike.  We docked the anvil to the spike and obtained a 33 mm end-to-end colorectal anastomosis stenosis. We confirmed to complete donuts  on the stapler.  We confirmed an excellent airtight patent anastomosis.  Another 5 mg of ICG was given and confirming the excellent vascularity of the anastomosis.  The colon was not twisted and without tension. We then confirmed adequate hemostasis and irrigated the pelvis and adjacent abdominal cavity with 3 L of normal saline solution. Then applied transfascial suture of 0 Vicryl to the 12 mm trocar site.  And closed all the skin sites with 4-0 Monocryl subcuticular.  Sealed the skin with Dermabond. Overall she appeared to tolerate procedure well we took her out of her position and transferred her to the recovery room in stable condition.      Campbell Lerner M.D., [facility] 01/25/2021 1:27 PM

## 2021-01-25 NOTE — Progress Notes (Signed)
Patient doing well since arriving from PACU. She received pain med once on this shift. Able to tolerate cler liquid diet

## 2021-01-25 NOTE — Anesthesia Procedure Notes (Signed)
Procedure Name: Intubation Date/Time: 01/25/2021 7:39 AM Performed by: Lowry Bowl, CRNA Pre-anesthesia Checklist: Patient identified, Emergency Drugs available, Suction available and Patient being monitored Patient Re-evaluated:Patient Re-evaluated prior to induction Oxygen Delivery Method: Circle system utilized Preoxygenation: Pre-oxygenation with 100% oxygen Induction Type: IV induction Ventilation: Mask ventilation without difficulty Laryngoscope Size: 3 and McGraph Grade View: Grade I Tube type: Oral Tube size: 6.5 mm Number of attempts: 1 Airway Equipment and Method: Stylet and Video-laryngoscopy (Videoscope used electively) Placement Confirmation: ETT inserted through vocal cords under direct vision, positive ETCO2 and breath sounds checked- equal and bilateral Secured at: 21 cm Tube secured with: Tape Dental Injury: Teeth and Oropharynx as per pre-operative assessment

## 2021-01-25 NOTE — Transfer of Care (Signed)
Immediate Anesthesia Transfer of Care Note  Patient: Cheryl Leonard  Procedure(s) Performed: XI ROBOT ASSISTED SIGMOID COLECTOMY, possible splenic flexure mobility LYSIS OF ADHESION CYSTOSCOPY WITH ICG  Patient Location: PACU  Anesthesia Type:General  Level of Consciousness: awake, drowsy and patient cooperative  Airway & Oxygen Therapy: Patient Spontanous Breathing and Patient connected to face mask oxygen  Post-op Assessment: Report given to RN and Post -op Vital signs reviewed and stable  Post vital signs: Reviewed and stable  Last Vitals:  Vitals Value Taken Time  BP 146/131 01/25/21 1330  Temp 36.3 C 01/25/21 1330  Pulse 64 01/25/21 1337  Resp 13 01/25/21 1337  SpO2 100 % 01/25/21 1337  Vitals shown include unvalidated device data.  Last Pain:  Vitals:   01/25/21 0645  TempSrc: Tympanic  PainSc: 0-No pain         Complications: No notable events documented.

## 2021-01-25 NOTE — Op Note (Signed)
Date of procedure: 01/25/21  Preoperative diagnosis:  Colovesical fistula Diverticulitis Recurrent UTI  Postoperative diagnosis:  Same as above  Procedure: Cystoscopy Retrograde ureteral injection of ICG for the purpose of ureteral identification  Surgeon: Vanna Scotland, MD  Anesthesia: General  Complications: None  Intraoperative findings: Mild inflammation around the trigone.  Otherwise unremarkable.  EBL: Minimal  Specimens: None  Drains: 16 French Foley catheter  Indication: Terra Aveni Bloodworth is a 76 y.o. patient with recurrent UTIs found to have possible colovesical fistula undergoing colectomy by Dr Claudine Mouton.  After reviewing the management options for treatment, she elected to proceed with the above surgical procedure(s). We have discussed the potential benefits and risks of the procedure, side effects of the proposed treatment, the likelihood of the patient achieving the goals of the procedure, and any potential problems that might occur during the procedure or recuperation. Informed consent has been obtained.  Description of procedure:  The patient was taken to the operating room and general anesthesia was induced.  The patient was placed in the dorsal lithotomy position, prepped and draped in the usual sterile fashion, and preoperative antibiotics were administered. A preoperative time-out was performed.   21 French cystoscope was advanced per urethra into the bladder.  Notably, the bladder had some slight descent of the bladder neck with a mildly inflamed appearing trigone.  Otherwise, the bladder was unremarkable.  Attention was turned to the left ureteral orifice.  I attempted to cannulate this with the open-ended ureteral catheter but ended up needing to use a wire in order to intubate the UO.  I advanced the open-ended ureteral catheter a few centimeters within the distal ureter and injected 10 cc of ICG material slowly back in the catheter along the way.  Next, I  turned my attention to the right UO.  This was cannulated which more easily an additional 10 cc of ICG was injected.  There was good E flux of the contrast material noted bilaterally.  The scope was removed.  A 16 French Foley catheter was placed.  The patient was then cleaned and dried and positioned for the remainder of the procedure to be performed by Dr. Claudine Mouton.  Please see his dictation for further details.  Vanna Scotland, M.D.

## 2021-01-26 ENCOUNTER — Encounter: Payer: Self-pay | Admitting: Surgery

## 2021-01-26 LAB — CBC
HCT: 25.8 % — ABNORMAL LOW (ref 36.0–46.0)
Hemoglobin: 8.1 g/dL — ABNORMAL LOW (ref 12.0–15.0)
MCH: 28.3 pg (ref 26.0–34.0)
MCHC: 31.4 g/dL (ref 30.0–36.0)
MCV: 90.2 fL (ref 80.0–100.0)
Platelets: 222 10*3/uL (ref 150–400)
RBC: 2.86 MIL/uL — ABNORMAL LOW (ref 3.87–5.11)
RDW: 16.7 % — ABNORMAL HIGH (ref 11.5–15.5)
WBC: 11.5 10*3/uL — ABNORMAL HIGH (ref 4.0–10.5)
nRBC: 0 % (ref 0.0–0.2)

## 2021-01-26 LAB — BASIC METABOLIC PANEL
Anion gap: 8 (ref 5–15)
BUN: 23 mg/dL (ref 8–23)
CO2: 18 mmol/L — ABNORMAL LOW (ref 22–32)
Calcium: 8.1 mg/dL — ABNORMAL LOW (ref 8.9–10.3)
Chloride: 110 mmol/L (ref 98–111)
Creatinine, Ser: 1.54 mg/dL — ABNORMAL HIGH (ref 0.44–1.00)
GFR, Estimated: 35 mL/min — ABNORMAL LOW (ref >60)
Glucose, Bld: 131 mg/dL — ABNORMAL HIGH (ref 70–99)
Potassium: 4.3 mmol/L (ref 3.5–5.1)
Sodium: 136 mmol/L (ref 135–145)

## 2021-01-26 LAB — GLUCOSE, CAPILLARY
Glucose-Capillary: 102 mg/dL — ABNORMAL HIGH (ref 70–99)
Glucose-Capillary: 118 mg/dL — ABNORMAL HIGH (ref 70–99)
Glucose-Capillary: 120 mg/dL — ABNORMAL HIGH (ref 70–99)
Glucose-Capillary: 160 mg/dL — ABNORMAL HIGH (ref 70–99)
Glucose-Capillary: 184 mg/dL — ABNORMAL HIGH (ref 70–99)
Glucose-Capillary: 203 mg/dL — ABNORMAL HIGH (ref 70–99)

## 2021-01-26 NOTE — TOC Initial Note (Signed)
Transition of Care [facility]) - Initial/Assessment Note    Patient Details  Name: Cheryl Leonard MRN: [id number] Date of Birth: [birth year]  Transition of Care [facility]) CM/SW Contact:    Beverly Sessions, RN Phone Number: 01/26/2021, 3:52 PM  Clinical Narrative:                 Admitted for:1 Day Post-Op s/p robotic assisted laparoscopic sigmoid colectomy and EEA for colovesical fistula Admitted from: Home with husband  PCP: [place] - denies issues obtaining medications  Current home health/prior home health/DME: no DME or home health  Patient agreeable to home health RN post discharge.  Patient states she does not have a preference of home health agency  Referral made to [facility] with [place]    Expected Discharge Plan: [place] Barriers to Discharge: Barriers Resolved   Patient Goals and CMS Choice        Expected Discharge Plan and Services Expected Discharge Plan: [place]       Living arrangements for the past 2 months: Single Family Home                           HH Arranged: RN [facility] Agency: [place] ([place])        Prior Living Arrangements/Services Living arrangements for the past 2 months: Single Family Home Lives with:: Spouse Patient language and need for interpreter reviewed:: Yes Do you feel safe going back to the place where you live?: Yes      Need for Family Participation in Patient Care: Yes (Comment) Care giver support system in place?: Yes (comment)   Criminal Activity/Legal Involvement Pertinent to Current Situation/Hospitalization: No - Comment as needed  Activities of Daily Living Home Assistive Devices/Equipment: None ADL Screening (condition at time of admission) Patient's cognitive ability adequate to safely complete daily activities?: Yes Is the patient deaf or have difficulty hearing?: No Does the patient have difficulty seeing, even when wearing  glasses/contacts?: No Does the patient have difficulty concentrating, remembering, or making decisions?: No Patient able to express need for assistance with ADLs?: Yes Does the patient have difficulty dressing or bathing?: No Independently performs ADLs?: Yes (appropriate for developmental age) Does the patient have difficulty walking or climbing stairs?: No Weakness of Legs: None Weakness of Arms/Hands: None  Permission Sought/Granted                  Emotional Assessment       Orientation: : Oriented to Self, Oriented to Place, Oriented to  Time, Oriented to Situation Alcohol / Substance Use: Not Applicable Psych Involvement: No (comment)  Admission diagnosis:  Colovesical fistula [N32.1] 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   Rectal bleeding 08/11/2020   Colitis 08/11/2020   AKI (acute kidney injury) ([place]) 08/11/2020   Rectal bleed 08/11/2020   Gangrene of toe of right foot ([place]) 05/04/2020   History of 2019 novel coronavirus disease (COVID-19) 02/20/2019   Anemia of chronic kidney failure, stage 3 (moderate) ([place]) 02/05/2019   Normocytic anemia 02/01/2019   Osteoporosis 11/15/2018   Atherosclerosis of native arteries of extremity with rest pain ([place]) 09/25/2018   Osteoporosis, 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   Wound disruption, post-op, skin, subsequent encounter 04/09/2018   Postoperative wound infection 03/21/2018   Atherosclerosis of artery of extremity with intermittent claudication ([place]) 03/08/2018   Atherosclerosis of native arteries of  the extremities with ulceration ([place]) 10/13/2017   DM type 2 with diabetic peripheral neuropathy ([place]) 10/12/2017   History of hip fracture 10/12/2017   Type 2 diabetes mellitus with vascular disease ([place]) 10/12/2017   Carotid atherosclerosis, bilateral 07/11/2017   Medicare annual wellness visit, initial 05/03/2017   Peripheral vascular disease, unspecified ([place]) 03/01/2017    Diabetic ulcer of toe of left foot associated with type 2 diabetes mellitus ([place]) 03/01/2017   Chronic painful diabetic neuropathy ([place]) 06/02/2016   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   Hip fracture ([place]) 10/30/2015   Greater trochanter fracture ([place]) 10/29/2015   Acquired hypothyroidism 10/16/2015   Stage 3b chronic kidney disease ([place]) 10/16/2015   Hyperlipidemia, mixed 04/16/2015   Benign essential hypertension 04/16/2015   Closed compression fracture of lumbar vertebra ([place]) 04/16/2015   Iron deficiency anemia 09/22/2014   Disease of thyroid gland 09/22/2014   Diabetes mellitus type 2, uncontrolled [id number]   Lichen sclerosus [id number]   History of positive PPD 10/08/2013   PCP:  Rusty Aus, MD Pharmacy:   [id number] [place], [place] [address] [place] [address] Phone: [phone] Fax: [phone]     Social Determinants of Health (SDOH) Interventions    Readmission Risk Interventions No flowsheet data found.

## 2021-01-26 NOTE — Anesthesia Postprocedure Evaluation (Signed)
Anesthesia Post Note  Patient: Cheryl Leonard  Procedure(s) Performed: XI ROBOT ASSISTED SIGMOID COLECTOMY, possible splenic flexure mobility LYSIS OF ADHESION CYSTOSCOPY WITH ICG  Patient location during evaluation: PACU Anesthesia Type: General Level of consciousness: awake and alert Pain management: pain level controlled Vital Signs Assessment: post-procedure vital signs reviewed and stable Respiratory status: spontaneous breathing, nonlabored ventilation and respiratory function stable Cardiovascular status: blood pressure returned to baseline and stable Postop Assessment: no apparent nausea or vomiting Anesthetic complications: no   No notable events documented.   Last Vitals:  Vitals:   01/25/21 2032 01/26/21 0342  BP: 111/68 (!) 102/55  Pulse: 82 90  Resp: 20 20  Temp: 36.6 C 37.6 C  SpO2: 100% 92%    Last Pain:  Vitals:   01/26/21 0342  TempSrc: Oral  PainSc:    Treated hypotension with IVF and calcium. A bedside echo revealed hypovolemia.               Foye Deer

## 2021-01-26 NOTE — Progress Notes (Addendum)
[facility] Hospital Day(s): 1.   Post op day(s): 1 Day Post-Op.   Interval History:  Patient seen and examined No acute events or new complaints overnight.  No fever, chills, nausea, emesis Slight bump in leukocytosis to 11.5K; likely reactive from OR Hgb 8.1; suspect dilutional  Renal function appears near her baseline; sCr - 1.54; UO - 180 ccs + unmeasured  No significant electrolyte derangements She is on CLD Awaiting flatus   Vital signs in last 24 hours: [min-max] current  Temp:  [97.3 F (36.3 C)-99.7 F (37.6 C)] 99.7 F (37.6 C) (01/24 0342) Pulse Rate:  [58-90] 90 (01/24 0342) Resp:  [8-21] 20 (01/24 0342) BP: (71-146)/(43-131) 102/55 (01/24 0342) SpO2:  [92 %-100 %] 92 % (01/24 0342)     Height: 5\' 5"  (165.1 cm) Weight: 74.8 kg BMI (Calculated): 27.46   Intake/Output last 2 shifts:  01/23 0701 - 01/24 0700 In: 2310 [P.O.:160; I.V.:2000; IV Piggyback:150] Out: 230 [Urine:180; Blood:50]   Physical Exam:  Constitutional: alert, cooperative and no distress  Respiratory: breathing non-labored at rest  Cardiovascular: regular rate and sinus rhythm  Gastrointestinal: soft, incisional soreness, and non-distended Genitourinary: Foley in place  Integumentary: Laparoscopic incisions are CDI with dermabond  Labs:  CBC Latest Ref Rng & Units 01/26/2021 01/11/2021 10/16/2020  WBC 4.0 - 10.5 K/uL 11.5(H) 6.3 6.1  Hemoglobin 12.0 - 15.0 g/dL 8.1(L) 9.5(L) 9.3(L)  Hematocrit 36.0 - 46.0 % 25.8(L) 29.0(L) 28.8(L)  Platelets 150 - 400 K/uL 222 265 262   CMP Latest Ref Rng & Units 01/26/2021 01/25/2021 01/11/2021  Glucose 70 - 99 mg/dL 131(H) 239(H) 279(H)  BUN 8 - 23 mg/dL 23 25(H) 34(H)  Creatinine 0.44 - 1.00 mg/dL 1.54(H) 1.76(H) 1.33(H)  Sodium 135 - 145 mmol/L 136 138 134(L)  Potassium 3.5 - 5.1 mmol/L 4.3 4.6 4.6  Chloride 98 - 111 mmol/L 110 110 102  CO2 22 - 32 mmol/L 18(L) 19(L) 24  Calcium 8.9 - 10.3 mg/dL 8.1(L) 7.6(L) 9.7   Total Protein 6.5 - 8.1 g/dL - - 7.1  Total Bilirubin 0.3 - 1.2 mg/dL - - 0.6  Alkaline Phos 38 - 126 U/L - - 63  AST 15 - 41 U/L - - 20  ALT 0 - 44 U/L - - 18     Imaging studies: No new pertinent imaging studies   Assessment/Plan:  76 y.o. female 1 Day Post-Op s/p robotic assisted laparoscopic sigmoid colectomy and EEA for colovesical fistula   - Advance to full liquids; await ROBF prior to further advancement  - Wean from IVF as diet advances - Continue foley for now in setting of colovesical fistula   - Monitor abdominal examination; on-going bowel function   - Pain control prn; antiemetics prn - Monitor leukocytosis; likely reactive - Mobilization as tolerated   All of the above findings and recommendations were discussed with the patient, and the medical team, and all of patient's questions were answered to her expressed satisfaction.  -- Edison Simon, PA-C  [facility] 01/26/2021, 8:05 AM [phone] M-F: 7am - 4pm

## 2021-01-27 LAB — GLUCOSE, CAPILLARY
Glucose-Capillary: 106 mg/dL — ABNORMAL HIGH (ref 70–99)
Glucose-Capillary: 111 mg/dL — ABNORMAL HIGH (ref 70–99)
Glucose-Capillary: 130 mg/dL — ABNORMAL HIGH (ref 70–99)
Glucose-Capillary: 170 mg/dL — ABNORMAL HIGH (ref 70–99)
Glucose-Capillary: 192 mg/dL — ABNORMAL HIGH (ref 70–99)
Glucose-Capillary: 212 mg/dL — ABNORMAL HIGH (ref 70–99)
Glucose-Capillary: 88 mg/dL (ref 70–99)

## 2021-01-27 LAB — SURGICAL PATHOLOGY

## 2021-01-27 NOTE — Progress Notes (Signed)
[facility] Day(s): 2.   Post op day(s): 2 Days Post-Op.   Interval History:  Patient seen and examined No acute events or new complaints overnight.  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 She is on full liquid diet She is passing flatus and had numerous BMs  Vital signs in last 24 hours: [min-max] current  Temp:  [98.6 F (37 C)-99.4 F (37.4 C)] 98.7 F (37.1 C) (01/25 0809) Pulse Rate:  [53-83] 83 (01/25 0809) Resp:  [16-20] 18 (01/25 0809) BP: (118-136)/(41-75) 136/75 (01/25 0809) SpO2:  [84 %-100 %] 96 % (01/25 0809)     Height: 5\' 5"  (165.1 cm) Weight: 74.8 kg BMI (Calculated): 27.46   Intake/Output last 2 shifts:  01/24 0701 - 01/25 0700 In: 1020 [P.O.:1020] Out: 3425 [Urine:3425]   Physical Exam:  Constitutional: alert, cooperative and no distress  Respiratory: breathing non-labored at rest  Cardiovascular: regular rate and sinus rhythm  Gastrointestinal: soft, incisional soreness, and non-distended Genitourinary: Foley in place  Integumentary: Laparoscopic incisions are CDI with dermabond  Labs:  CBC Latest Ref Rng & Units 01/26/2021 01/11/2021 10/16/2020  WBC 4.0 - 10.5 K/uL 11.5(H) 6.3 6.1  Hemoglobin 12.0 - 15.0 g/dL 8.1(L) 9.5(L) 9.3(L)  Hematocrit 36.0 - 46.0 % 25.8(L) 29.0(L) 28.8(L)  Platelets 150 - 400 K/uL 222 265 262   CMP Latest Ref Rng & Units 01/26/2021 01/25/2021 01/11/2021  Glucose 70 - 99 mg/dL 131(H) 239(H) 279(H)  BUN 8 - 23 mg/dL 23 25(H) 34(H)  Creatinine 0.44 - 1.00 mg/dL 1.54(H) 1.76(H) 1.33(H)  Sodium 135 - 145 mmol/L 136 138 134(L)  Potassium 3.5 - 5.1 mmol/L 4.3 4.6 4.6  Chloride 98 - 111 mmol/L 110 110 102  CO2 22 - 32 mmol/L 18(L) 19(L) 24  Calcium 8.9 - 10.3 mg/dL 8.1(L) 7.6(L) 9.7  Total Protein 6.5 - 8.1 g/dL - - 7.1  Total Bilirubin 0.3 - 1.2 mg/dL - - 0.6  Alkaline Phos 38 - 126 U/L - -  63  AST 15 - 41 U/L - - 20  ALT 0 - 44 U/L - - 18     Imaging studies: No new pertinent imaging studies   Assessment/Plan:  76 y.o. female 2 Days Post-Op s/p robotic assisted laparoscopic sigmoid colectomy and EEA for colovesical fistula   - Advance to soft diet  - Discontinue IVF - Continue foley for now in setting of colovesical fistula; will need cystogram in ~7 days prior to removal to ensure no leak.   - Monitor abdominal examination; on-going bowel function   - Pain control prn; antiemetics prn - Mobilization as tolerated   - Discharge Planning: She can potentially go home this afternoon vs tomorrow morning  All of the above findings and recommendations were discussed with the patient, and the medical team, and all of patient's questions were answered to her expressed satisfaction.  -- Edison Simon, PA-C [facility] 01/27/2021, 8:25 AM [phone] M-F: 7am - 4pm

## 2021-01-28 ENCOUNTER — Telehealth: Payer: Self-pay | Admitting: Surgery

## 2021-01-28 LAB — TYPE AND SCREEN
ABO/RH(D): B NEG
Antibody Screen: POSITIVE
PT AG Type: NEGATIVE
Unit division: 0
Unit division: 0

## 2021-01-28 LAB — GLUCOSE, CAPILLARY
Glucose-Capillary: 110 mg/dL — ABNORMAL HIGH (ref 70–99)
Glucose-Capillary: 135 mg/dL — ABNORMAL HIGH (ref 70–99)
Glucose-Capillary: 160 mg/dL — ABNORMAL HIGH (ref 70–99)

## 2021-01-28 LAB — BPAM RBC
Blood Product Expiration Date: 202301292359
Blood Product Expiration Date: 202301302359
Unit Type and Rh: 9500
Unit Type and Rh: 9500

## 2021-01-28 MED ORDER — HYDROCODONE-ACETAMINOPHEN 5-325 MG PO TABS: 1.0000 | ORAL_TABLET | Freq: Four times a day (QID) | ORAL | 0 refills | Status: DC | PRN

## 2021-01-28 MED ORDER — IBUPROFEN 600 MG PO TABS: 600.0000 mg | ORAL_TABLET | Freq: Four times a day (QID) | ORAL | 0 refills | Status: DC | PRN

## 2021-01-28 NOTE — Telephone Encounter (Signed)
Patient needs to have cystogram prior to appointment on feb 2nd with dr. Claudine Mouton can you order this.

## 2021-01-28 NOTE — Care Management Important Message (Signed)
Important Message  Patient Details  Name: Cheryl Leonard MRN: [id number] Date of Birth: Apr 30, 1945   Medicare Important Message Given:  Yes     [name] 01/28/2021, 1:19 PM

## 2021-01-28 NOTE — Discharge Instructions (Signed)
In addition to included general post-operative instructions,  Diet: Resume home diet.   Activity: No heavy lifting >20 pounds (children, pets, laundry, garbage) or strenuous activity for 4 weeks weeks, but light activity and walking are encouraged. Do not drive or drink alcohol if taking narcotic pain medications or having pain that might distract from driving.  Wound care: You may shower/get incision wet with soapy water and pat dry (do not rub incisions), but no baths or submerging incision underwater until follow-up.   Foley: Maintain foley at home. We will obtain cystogram prior to your follow up appointment in about 1 week.   Medications: Resume all home medications. For mild to moderate pain: acetaminophen (Tylenol) or ibuprofen/naproxen (if no kidney disease). Combining Tylenol with alcohol can substantially increase your risk of causing liver disease. Narcotic pain medications, if prescribed, can be used for severe pain, though may cause nausea, constipation, and drowsiness. Do not combine Tylenol and Percocet (or similar) within a 6 hour period as Percocet (and similar) contain(s) Tylenol. If you do not need the narcotic pain medication, you do not need to fill the prescription.  Call office [phone] / [phone]) at any time if any questions, worsening pain, fevers/chills, bleeding, drainage from incision site, or other concerns.

## 2021-01-28 NOTE — TOC Transition Note (Signed)
Transition of Care [facility]) - CM/SW Discharge Note   Patient Details  Name: Cheryl Leonard MRN: [id number] Date of Birth: 1945-05-08  Transition of Care [facility]) CM/SW Contact:  Beverly Sessions, RN Phone Number: 01/28/2021, 8:46 AM   Clinical Narrative:     Patient to dc today [name] with [facility] notified of discharge   Final next level of care: [place] Barriers to Discharge: Barriers Resolved   Patient Goals and CMS Choice        Discharge Placement                       Discharge Plan and Services                          HH Arranged: RN [facility] Agency: [place] [facility])        Social Determinants of Health (SDOH) Interventions     Readmission Risk Interventions No flowsheet data found.

## 2021-01-28 NOTE — Discharge Summary (Signed)
[facility] SURGICAL DISCHARGE SUMMARY  Patient ID: Cheryl Leonard MRN: [id number] DOB/AGE: 03-Sep-1945 76 y.o.  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 female with history of colovesical fistula who presents to [facility] on 01/23 for surgical resection with Dr Aleen Sells Course: Informed consent was obtained and documented, and patient underwent uneventful robotic assisted laparoscopic LAR (Dr Claudine Mouton, 01/25/2021).  Post-operatively, patient di well. Advancement of patient's diet and ambulation were well-tolerated. The remainder of patient's hospital course was essentially unremarkable, and discharge planning was initiated accordingly with patient safely able to be discharged home with appropriate discharge instructions, pain control, and outpatient follow-up after all of her questions were answered to her expressed satisfaction.   Discharge Condition: Good   Physical Examination:  Constitutional: Well appearing female, NAD Pulmonary: Normal effort, no respiratory distress Gastrointestinal: Soft, incisional soreness, non-distended, no rebound/guarding Genitourinary: Foley in place (she will go home with this) Skin: Laparoscopic incisions are CDI with dermabond; ecchymosis   Allergies as of 01/28/2021   No Known Allergies      Medication List     STOP taking these medications    enoxaparin 80 MG/0.8ML injection Commonly known as: Lovenox   erythromycin base 500 MG tablet Commonly known as: E-MYCIN   metroNIDAZOLE 500 MG tablet Commonly known as: Flagyl   polyethylene glycol powder 17 GM/SCOOP powder Commonly known as: MiraLax       TAKE these medications    ALPRAZolam 0.25 MG tablet Commonly known  as: XANAX Take 0.25 mg by mouth daily as needed for anxiety.   amLODipine 5 MG tablet Commonly known as: NORVASC Take 5 mg by mouth 2 (two) times daily.   aspirin EC 81 MG tablet Take 81 mg by mouth daily.   cholecalciferol 1000 units tablet Commonly known as: VITAMIN D Take 1,000 Units by mouth 2 (two) times daily.   CORAL CALCIUM PO Take 1 tablet by mouth 2 (two) times daily.   denosumab 60 MG/ML Soln injection Commonly known as: PROLIA Inject 60 mg into the skin every 6 (six) months.   escitalopram 10 MG tablet Commonly known as: LEXAPRO Take 10 mg by mouth daily.   famotidine 40 MG tablet Commonly known as: PEPCID Take 40 mg by mouth daily.   furosemide 20 MG tablet Commonly known as: LASIX Take 20 mg by mouth every Monday, Wednesday, and Friday.   gabapentin 300 MG capsule Commonly known as: NEURONTIN Take one capsule at night as needed for your neuropathy foot pain.   HYDROcodone-acetaminophen 5-325 MG tablet Commonly known as: NORCO/VICODIN Take 1 tablet by mouth every 6 (six) hours as needed for severe pain.   ibuprofen 600 MG tablet Commonly known as: ADVIL Take 1 tablet (600 mg total) by mouth every 6 (six) hours as needed.   levothyroxine 100 MCG tablet Commonly known as: SYNTHROID Take 100 mcg by mouth daily before breakfast.   lidocaine-prilocaine cream Commonly known as: EMLA Apply 1 application topically as needed (apply prior to port a cath access).   Magnesium 500 MG Tabs Take 500 mg by mouth every morning.   metFORMIN 1000 MG tablet Commonly known as: GLUCOPHAGE Take 1,000 mg by mouth 2 (two) times daily with a meal.   metoprolol succinate 50 MG 24 hr tablet Commonly known as:  TOPROL-XL Take 50 mg by mouth daily.   mirtazapine 15 MG tablet Commonly known as: REMERON Take 15 mg by mouth daily as needed (Depression).   multivitamin with minerals Tabs tablet Take 1 tablet by mouth daily. Centrum Silver   nystatin cream Commonly  known as: MYCOSTATIN Apply 1 application topically daily as needed (Yeast infection).   olmesartan 20 MG tablet Commonly known as: BENICAR Take 20 mg by mouth daily.   pantoprazole sodium 40 mg Commonly known as: PROTONIX Take 40 mg by mouth every morning.   rosuvastatin 20 MG tablet Commonly known as: CRESTOR Take 20 mg by mouth every morning.   Evaristo Bury FlexTouch 200 UNIT/ML FlexTouch Pen Generic drug: insulin degludec Inject 25-30 Units as directed at bedtime. Titrate according to fasting blood glucose not to exceed 50 units a day   vitamin B-12 1000 MCG tablet Commonly known as: CYANOCOBALAMIN Take 1,000 mcg by mouth daily.   vitamin C 250 MG tablet Commonly known as: ASCORBIC ACID Take 250 mg by mouth daily.   vitamin E 180 MG (400 UNITS) capsule Take 400 Units by mouth daily.   Xarelto 20 MG Tabs tablet Generic drug: rivaroxaban TAKE 1 TABLET BY MOUTH DAILY WITH SUPPER.   zolpidem 10 MG tablet Commonly known as: AMBIEN Take 10 mg by mouth at bedtime.          Follow-up Information     Campbell Lerner, MD. Schedule an appointment as soon as possible for a visit in 1 week(s).   Specialty: General Surgery Why: s/p sigmoid colectomy, has foley due to colovesical fistula, needs cystogram prior to appointment Contact information: [address] [place] [address] [phone]                  Time spent on discharge management including discussion of hospital course, clinical condition, outpatient instructions, prescriptions, and follow up with the patient and members of the medical team: >30 minutes  -- Lynden Oxford , PA-C [facility]  01/28/2021, 8:47 AM [phone] M-F: 7am - 4pm

## 2021-01-28 NOTE — Progress Notes (Signed)
Pt discharged per MD order. IV removed. Discharge instructions reviewed with pt and her family. Foley care instructions reviewed with pt and daughter. Demonstrated how to change from leg bag to overnight bag. All questions answered to pt satisfaction. Pt taken to car in wheelchair by staff.

## 2021-02-01 ENCOUNTER — Telehealth: Payer: Self-pay | Admitting: Surgery

## 2021-02-01 NOTE — Telephone Encounter (Signed)
Home health was suppose to be going out to the patients home, patient told home health she didn't need them to come out for her and refused care with them.

## 2021-02-02 ENCOUNTER — Ambulatory Visit (INDEPENDENT_AMBULATORY_CARE_PROVIDER_SITE_OTHER): Payer: Medicare Other | Admitting: Surgery

## 2021-02-02 ENCOUNTER — Encounter: Payer: Self-pay | Admitting: Surgery

## 2021-02-02 ENCOUNTER — Encounter: Payer: Self-pay | Admitting: Internal Medicine

## 2021-02-02 ENCOUNTER — Other Ambulatory Visit: Payer: Self-pay

## 2021-02-02 ENCOUNTER — Ambulatory Visit
Admission: RE | Admit: 2021-02-02 | Discharge: 2021-02-02 | Disposition: A | Discharge status: Home / Self Care | Payer: Medicare Other | Source: Ambulatory Visit | Attending: Physician Assistant | Admitting: Physician Assistant

## 2021-02-02 VITALS — BP 157/63 | HR 73 | Temp 98.6°F | Ht 65.0 in | Wt 166.0 lb

## 2021-02-02 DIAGNOSIS — N321 Vesicointestinal fistula: Secondary | ICD-10-CM | POA: Diagnosis not present

## 2021-02-02 DIAGNOSIS — R310 Gross hematuria: Secondary | ICD-10-CM | POA: Insufficient documentation

## 2021-02-02 MED ORDER — IOTHALAMATE MEGLUMINE 17.2 % UR SOLN
250.0000 mL | Freq: Once | URETHRAL | Status: AC | PRN
  Administered 2021-02-02: 250 mL via INTRAVESICAL

## 2021-02-02 NOTE — Patient Instructions (Signed)
Get your cystogram done. Depending on what is seen we may be able to remove your catheter.   We will go from there to see what needs to be done.   Follow up as scheduled on Thursday.

## 2021-02-02 NOTE — Progress Notes (Addendum)
She is scheduled today for her cystogram, anticipating removal of the Foley catheter to follow-up.  She presents here today with concerns about a darkening color/blood-tinged of her urine with some haziness.  She denies any bladder discomfort.  She reports surgical sites are okay. The change occurred last night.  There is some minimal bright red blood in her Foley catheter tubing, but no significant hematuria noted in the bag.  I suspect this may be Foley irritation.  She is not having any fevers or chills.  She reports her bowel movements are 4+ daily, not quite formed.  She denies diarrhea, abdominal pain, nausea or vomiting. She appears quite relieved, I encouraged her to proceed with the cystogram so we can remove her Foley.  We will be following her up soon for Foley removal, will reassess her in 2 days to determine the need for further antibiotics or treatment for UTI.

## 2021-02-04 ENCOUNTER — Encounter: Payer: Self-pay | Admitting: Surgery

## 2021-02-04 ENCOUNTER — Other Ambulatory Visit: Payer: Self-pay

## 2021-02-04 ENCOUNTER — Ambulatory Visit (INDEPENDENT_AMBULATORY_CARE_PROVIDER_SITE_OTHER): Payer: Medicare Other | Admitting: Surgery

## 2021-02-04 VITALS — BP 120/52 | HR 77 | Temp 99.0°F | Ht 65.0 in | Wt 160.8 lb

## 2021-02-04 DIAGNOSIS — Z09 Encounter for follow-up examination after completed treatment for conditions other than malignant neoplasm: Secondary | ICD-10-CM

## 2021-02-04 DIAGNOSIS — N321 Vesicointestinal fistula: Secondary | ICD-10-CM

## 2021-02-04 NOTE — Progress Notes (Signed)
[facility] POST-OP OFFICE VISIT  02/04/2021  HPI: Cheryl Leonard is a 76 y.o. female 10 days s/p robotic assisted sigmoid colectomy with natural orifice extraction for colovesical fistula.  She presents with her Foley catheter today, reporting that her urine has cleared.  She had a cystogram 2 days ago which confirmed the absence of any bladder defect that might warrant continuing her catheter.  She reports no pain.  Vital signs: BP (!) 120/52    Pulse 77    Temp 99 F (37.2 C)    Ht 5\' 5"  (1.651 m)    Wt 160 lb 12.8 oz (72.9 kg)    SpO2 97%    BMI 26.76 kg/m    Physical Exam: Constitutional: She appears well. Abdomen: Soft and nontender. Skin: Incisions clean dry and intact,  Foley bag in place with clear liquid appearing urine as well.  Assessment/Plan: This is a 76 y.o. female 10 days s/p robotic sigmoid colectomy.  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   Rectal bleeding 08/11/2020   Colitis 08/11/2020   AKI (acute kidney injury) ([place]) 08/11/2020   Rectal bleed 08/11/2020   Gangrene of toe of right foot ([place]) 05/04/2020   History of 2019 novel coronavirus disease (COVID-19) 02/20/2019   Anemia of chronic kidney failure, stage 3 (moderate) ([place]) 02/05/2019   Normocytic anemia 02/01/2019   Osteoporosis 11/15/2018   Atherosclerosis of native arteries of extremity with rest pain ([place]) 09/25/2018   Osteoporosis, 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   Wound disruption, post-op, skin, subsequent encounter 04/09/2018   Postoperative wound infection 03/21/2018   Atherosclerosis of artery of extremity with intermittent claudication ([place]) 03/08/2018   Atherosclerosis of native arteries of the extremities with ulceration ([place]) 10/13/2017   DM type 2 with diabetic peripheral neuropathy ([place]) 10/12/2017   History of hip fracture 10/12/2017   Type 2 diabetes mellitus with vascular disease ([place])  10/12/2017   Carotid atherosclerosis, bilateral 07/11/2017   Medicare annual wellness visit, initial 05/03/2017   Peripheral vascular disease, unspecified ([place]) 03/01/2017   Diabetic ulcer of toe of left foot associated with type 2 diabetes mellitus ([place]) 03/01/2017   Chronic painful diabetic neuropathy ([place]) 06/02/2016   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   Hip fracture ([place]) 10/30/2015   Greater trochanter fracture ([place]) 10/29/2015   Acquired hypothyroidism 10/16/2015   Stage 3b chronic kidney disease ([place]) 10/16/2015   Hyperlipidemia, mixed 04/16/2015   Benign essential hypertension 04/16/2015   Closed compression fracture of lumbar vertebra ([place]) 04/16/2015   Iron deficiency anemia 09/22/2014   Disease of thyroid gland 09/22/2014   Diabetes mellitus type 2, uncontrolled [id number]   Lichen sclerosus [id number]   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  Advised that she may have some degree of symptoms over the next few days due to the irritation from its presence.  If these symptoms persist we have asked her to notify us that we may assist her with any medications or antibiotics as needed. Otherwise we will have her follow-up in 1 month.    Ronny Bacon M.D., FACS 02/04/2021, 10:42 AM

## 2021-02-04 NOTE — Patient Instructions (Addendum)
Drink lots of fluids. You may get some irritation with urinating for 1-2 days. If it last more than 3 days let us know.  If you develop chills, or a fever, or fowl smelling urine let us know.   We will have you follow up here in 1 month.

## 2021-02-09 ENCOUNTER — Telehealth: Payer: Self-pay | Admitting: Surgery

## 2021-02-09 NOTE — Telephone Encounter (Signed)
Having bowel movements with gas. I let her know this is normal and that she may have more frequent bowel movements. Patient just wanted to be re-assured this is normal.

## 2021-02-09 NOTE — Telephone Encounter (Signed)
Patient is calling and is asking if one of the nurses would give her a call at [phone] patient is concerned about her bowel movements, pressure on the right side and has some gas. Please call patient and advise.

## 2021-02-09 NOTE — Telephone Encounter (Signed)
Sigmoid Colectomy 1/23/ Dr.Rodenberg- left message for patient to return call to office.

## 2021-02-10 NOTE — Progress Notes (Signed)
[facility] Note  02/12/2021     CHIEF COMPLAINT Patient presents for Retina Follow Up   HISTORY OF PRESENT ILLNESS: Cheryl Leonard is a 76 y.o. female who presents to the clinic today for:   HPI     Retina Follow Up   Patient presents with  CRVO/BRVO.  In right eye.  This started 4 weeks ago.  I, the attending physician,  performed the HPI with the patient and updated documentation appropriately.        Comments   Patient here for  4 weeks retina follow up fpr BRVO OD. Patient states vision doing the same. No difference. No eye pain. Had intestine sx part removed.      Last edited by Bernarda Caffey, MD on 02/12/2021  1:36 PM.     Referring physician: Rusty Aus, [place],  [place] [id number]  HISTORICAL INFORMATION:   Selected notes from the MEDICAL RECORD NUMBER Referred by Dr. Marvel Plan for concern of DME OD Lab Results  Component Value Date   HGBA1C 7.3 (H) 01/11/2021       CURRENT MEDICATIONS: No current outpatient medications on file. (Ophthalmic Drugs)   No current facility-administered medications for this visit. (Ophthalmic Drugs)   Current Outpatient Medications (Other)  Medication Sig   ALPRAZolam (XANAX) 0.25 MG tablet Take 0.25 mg by mouth daily as needed for anxiety.   amLODipine (NORVASC) 5 MG tablet Take 5 mg by mouth 2 (two) times daily.   aspirin EC 81 MG tablet Take 81 mg by mouth daily.    cholecalciferol (VITAMIN D) 1000 units tablet Take 1,000 Units by mouth 2 (two) times daily.   CORAL CALCIUM PO Take 1 tablet by mouth 2 (two) times daily.    denosumab (PROLIA) 60 MG/ML SOLN injection Inject 60 mg into the skin every 6 (six) months.    escitalopram (LEXAPRO) 10 MG tablet Take 10 mg by mouth daily.   famotidine (PEPCID) 40 MG tablet Take 40 mg by mouth daily.   furosemide (LASIX) 20 MG tablet Take 20 mg by mouth every Monday, Wednesday, and Friday.    gabapentin (NEURONTIN) 300 MG capsule Take one capsule at night as needed for your neuropathy foot pain.   HYDROcodone-acetaminophen (NORCO/VICODIN) 5-325 MG tablet Take 1 tablet by mouth every 6 (six) hours as needed for severe pain.   ibuprofen (ADVIL) 600 MG tablet Take 1 tablet (600 mg total) by mouth every 6 (six) hours as needed.   levothyroxine (SYNTHROID, LEVOTHROID) 100 MCG tablet Take 100 mcg by mouth daily before breakfast.    lidocaine-prilocaine (EMLA) cream Apply 1 application topically as needed (apply prior to port a cath access).   Magnesium 500 MG TABS Take 500 mg by mouth every morning.    metFORMIN (GLUCOPHAGE) 1000 MG tablet Take 1,000 mg by mouth 2 (two) times daily with a meal.   Multiple Vitamin (MULTIVITAMIN WITH MINERALS) TABS tablet Take 1 tablet by mouth daily. Centrum Silver   nystatin cream (MYCOSTATIN) Apply 1 application topically daily as needed (Yeast infection).   olmesartan (BENICAR) 20 MG tablet Take 20 mg by mouth daily.   pantoprazole sodium (PROTONIX) 40 mg Take 40 mg by mouth every morning.    rosuvastatin (CRESTOR) 20 MG tablet Take 20 mg by mouth every morning.   TRESIBA FLEXTOUCH 200 UNIT/ML SOPN Inject 25-30 Units as directed at bedtime. Titrate according to fasting blood glucose not to exceed 50  units a day   vitamin B-12 (CYANOCOBALAMIN) 1000 MCG tablet Take 1,000 mcg by mouth daily.   vitamin C (ASCORBIC ACID) 250 MG tablet Take 250 mg by mouth daily.   XARELTO 20 MG TABS tablet TAKE 1 TABLET BY MOUTH DAILY WITH SUPPER.   zolpidem (AMBIEN) 10 MG tablet Take 10 mg by mouth at bedtime.   metoprolol succinate (TOPROL-XL) 50 MG 24 hr tablet Take 50 mg by mouth daily.   mirtazapine (REMERON) 15 MG tablet Take 15 mg by mouth daily as needed (Depression).   vitamin E 400 UNIT capsule Take 400 Units by mouth daily.   No current facility-administered medications for this visit. (Other)   REVIEW OF SYSTEMS: ROS   Positive for: Gastrointestinal,  Musculoskeletal, Endocrine, Cardiovascular, Eyes, Respiratory Negative for: Constitutional, Neurological, Skin, Genitourinary, HENT, Psychiatric, Allergic/Imm, Heme/Lymph Last edited by Theodore Demark, COA on 02/12/2021  1:04 PM.     ALLERGIES No Known Allergies  PAST MEDICAL HISTORY Past Medical History:  Diagnosis Date   Anemia    Anxiety    Arthritis    Gout   Cataracts, both eyes    Diabetic retinopathy ([place])    NPDR OU   Diverticulitis    GERD (gastroesophageal reflux disease)    Gout    Headache    h/o migraines   History of fracture of patella    right knee   History of positive PPD    Patient always shows positive   Hyperlipidemia    Hypertension    Hypertensive retinopathy    OU   Hypothyroidism    Lichen sclerosus [id number]   of vulva   Metatarsal fracture    Neuropathy    Osteopenia    Peripheral vascular disease (HCC)    Polyneuropathy    numbness and tingling in feet and toes   Renal insufficiency    Stage 3   Sleep apnea    does not use cpap-lost weight    Type 2 diabetes mellitus, uncontrolled    Past Surgical History:  Procedure Laterality Date   ABDOMINAL HYSTERECTOMY     AMPUTATION TOE Right 05/08/2020   Procedure: AMPUTATION TOE-Right 4th Toe;  Surgeon: Caroline More, DPM;  Location: [facility];  Service: Podiatry;  Laterality: Right;   APPENDECTOMY     BREAST REDUCTION SURGERY  2001   CATARACT EXTRACTION     CESAREAN SECTION  1976   COLONOSCOPY  03/05/2013   Nml - due for repeat 03/06/2018   COLONOSCOPY WITH PROPOFOL N/A 03/18/2019   Procedure: COLONOSCOPY WITH PROPOFOL;  Surgeon: Toledo, Benay Pike, MD;  Location: [facility];  Service: Gastroenterology;  Laterality: N/A;   DIAGNOSTIC LAPAROSCOPY     DILATION AND CURETTAGE OF UTERUS  1989   ENDARTERECTOMY FEMORAL Bilateral 03/09/2018   Procedure: ENDARTERECTOMY FEMORAL;  Surgeon: Algernon Huxley, MD;  Location: [facility];  Service: Vascular;  Laterality: Bilateral;   ENDARTERECTOMY POPLITEAL  Left 03/09/2018   Procedure: ENDARTERECTOMY POPLITEAL AND SFA;  Surgeon: Algernon Huxley, MD;  Location: [facility];  Service: Vascular;  Laterality: Left;   ESOPHAGOGASTRODUODENOSCOPY  03/05/2013   ESOPHAGOGASTRODUODENOSCOPY (EGD) WITH PROPOFOL N/A 03/18/2019   Procedure: ESOPHAGOGASTRODUODENOSCOPY (EGD) WITH PROPOFOL;  Surgeon: Toledo, Benay Pike, MD;  Location: [facility];  Service: Gastroenterology;  Laterality: N/A;   EYE SURGERY     Eyelid Surgery  2012   INTRAMEDULLARY (IM) NAIL INTERTROCHANTERIC Left 10/30/2015   Procedure: INTRAMEDULLARY (IM) NAIL INTERTROCHANTRIC ;  Surgeon: Hessie Knows, MD;  Location: [facility];  Service:  Orthopedics;  Laterality: Left;   KYPHOPLASTY N/A 10/25/2018   Procedure: L4 KYPHOPLASTY;  Surgeon: Hessie Knows, MD;  Location: [facility];  Service: Orthopedics;  Laterality: N/A;   LAPAROSCOPIC HYSTERECTOMY  2000   total   LOWER EXTREMITY ANGIOGRAPHY Left 03/08/2017   Procedure: LOWER EXTREMITY ANGIOGRAPHY;  Surgeon: Algernon Huxley, MD;  Location: [facility];  Service: Cardiovascular;  Laterality: Left;   LOWER EXTREMITY ANGIOGRAPHY Left 10/30/2017   Procedure: LOWER EXTREMITY ANGIOGRAPHY;  Surgeon: Algernon Huxley, MD;  Location: [place];  Service: Cardiovascular;  Laterality: Left;   LOWER EXTREMITY ANGIOGRAPHY Right 03/08/2018   Procedure: LOWER EXTREMITY ANGIOGRAPHY;  Surgeon: Algernon Huxley, MD;  Location: [place] [facility];  Service: Cardiovascular;  Laterality: Right;   LOWER EXTREMITY ANGIOGRAPHY Left 10/01/2018   Procedure: LOWER EXTREMITY ANGIOGRAPHY;  Surgeon: Algernon Huxley, MD;  Location: [place] [facility];  Service: Cardiovascular;  Laterality: Left;   LOWER EXTREMITY ANGIOGRAPHY Right 10/08/2018   Procedure: LOWER EXTREMITY ANGIOGRAPHY;  Surgeon: Algernon Huxley, MD;  Location: [place];  Service: Cardiovascular;  Laterality: Right;   LOWER EXTREMITY ANGIOGRAPHY Right 05/07/2020   Procedure: Lower Extremity Angiography;  Surgeon:  Algernon Huxley, MD;  Location: [facility];  Service: Cardiovascular;  Laterality: Right;   LYSIS OF ADHESION  01/25/2021   Procedure: LYSIS OF ADHESION;  Surgeon: Ronny Bacon, MD;  Location: [facility];  Service: General;;   PERIPHERAL VASCULAR INTERVENTION  03/08/2018   Procedure: PERIPHERAL VASCULAR INTERVENTION;  Surgeon: Algernon Huxley, MD;  Location: [facility] [facility];  Service: Cardiovascular;;   PORTA CATH INSERTION N/A 02/17/2020   Procedure: PORTA CATH INSERTION;  Surgeon: Algernon Huxley, MD;  Location: [facility];  Service: Cardiovascular;  Laterality: N/A;   REDUCTION MAMMAPLASTY  1997   SACROPLASTY N/A 10/25/2018   Procedure: S1 SACROPLASTY;  Surgeon: Hessie Knows, MD;  Location: [facility];  Service: Orthopedics;  Laterality: N/A;   FAMILY HISTORY Family History  Problem Relation Age of Onset   Coronary artery disease Father    Heart attack Father    Coronary artery disease Mother    Heart attack Mother    Ovarian cancer Sister 19       sister had hormonal therapy for IVF txs-which increased risk factor for ovarian cancer   Breast cancer Neg Hx    SOCIAL HISTORY Social History   Tobacco Use   Smoking status: Former    Packs/day: 1.00    Years: 20.00    Pack years: 20.00    Types: Cigarettes    Quit date: 03/07/1996    Years since quitting: 24.9   Smokeless tobacco: Never   Tobacco comments:    started smoking at age 64  Vaping Use   Vaping Use: Never used  Substance Use Topics   Alcohol use: No    Alcohol/week: 0.0 standard drinks   Drug use: No       OPHTHALMIC EXAM: Base Eye Exam     Visual Acuity (Snellen - Linear)       Right Left   Dist [place] 20/25 20/20         Tonometry (Tonopen, 1:02 PM)       Right Left   Pressure 12 12         Pupils       Dark Light Shape React APD   Right 3 2 Round Minimal None   Left 3 2 Round Minimal  None         Visual Fields (Counting fingers)       Left Right    Full Full          Extraocular Movement       Right Left    Full, Ortho Full, Ortho         Neuro/Psych     Oriented x3: Yes   Mood/Affect: Normal         Dilation     Both eyes: 1.0% Mydriacyl, 2.5% Phenylephrine @ 1:02 PM           Slit Lamp and Fundus Exam     External Exam       Right Left   External Normal Normal         Slit Lamp Exam       Right Left   Lids/Lashes dermatochalasis dermatochalasis   Conjunctiva/Sclera White and quiet White and quiet   Cornea arcus; well healed cataract wound; 2-3+ diffuse Punctate epithelial erosions, decreased TBUT, mild Anterior basement membrane dystrophy superiorly arcus; well healed cataract wound, 2-3+ diffuse Punctate epithelial erosions, irregualr epi surface, decreased TBUT   Anterior Chamber Deep and quiet Deep and quiet   Iris Round and dilated Round and dilated   Lens PCIOL; open PC PCIOL; open PC   Anterior Vitreous syneresis, Posterior vitreous detachment, vitreous condensations inferiorly syneresis, Posterior vitreous detachment         Fundus Exam       Right Left   Disc Superior hyperemia and mild edema - improved, mild Pallor Pink and Sharp   C/D Ratio 0.6 0.5   Macula Flat, Blunted foveal reflex, trace cystic changes - slightly increased, +Epiretinal membrane, +MA flat; good foveal reflex, no heme or edema, small pigment clump IT to fovea   Vessels attenuated, Tortuous Vascular attenuation   Periphery Attached; focal dot heme temporal periphery Attached, no heme           IMAGING AND PROCEDURES  Imaging and Procedures for 04/25/17  OCT, Retina - OU - Both Eyes       Right Eye Quality was good. Central Foveal Thickness: 339. Progression has worsened. Findings include abnormal foveal contour, epiretinal membrane, no SRF, intraretinal fluid (Mild Interval increase in IRF/cystic changes).   Left Eye Quality was good. Central Foveal Thickness: 284. Progression has been stable. Findings include normal foveal  contour, no IRF, no SRF (Trace ERM).   Notes *Images captured and stored on drive  Diagnosis / Impression:  OD: BRVO w/ CME - Mild Interval increase in IRF/cystic changes OS: NFP; no IRF/SRF--stable, trace ERM  Clinical management:  See below  Abbreviations: NFP - Normal foveal profile. CME - cystoid macular edema. PED - pigment epithelial detachment. IRF - intraretinal fluid. SRF - subretinal fluid. EZ - ellipsoid zone. ERM - epiretinal membrane. ORA - outer retinal atrophy. ORT - outer retinal tubulation. SRHM - subretinal hyper-reflective material      Intravitreal Injection, Pharmacologic Agent - OD - Right Eye       Time Out 02/12/2021. 1:24 PM. Confirmed correct patient, procedure, site, and patient consented.   Anesthesia Topical anesthesia was used. Anesthetic medications included Lidocaine 2%, Proparacaine 0.5%.   Procedure Preparation included 5% betadine to ocular surface, eyelid speculum. A (32g) needle was used.   Injection: 2 mg aflibercept 2 MG/0.05ML   Route: Intravitreal, Site: Right Eye   NDC: [id number], Lot: [id number], Expiration date: 01/02/2022, Waste: 0.05 mL   Post-op Post injection exam found  visual acuity of at least counting fingers. The patient tolerated the procedure well. There were no complications. The patient received written and verbal post procedure care education. Post injection medications were not given.            ASSESSMENT/PLAN:    ICD-10-CM   1. 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 Both eyes affected by mild nonproliferative diabetic retinopathy with macular edema, associated with type 2 diabetes mellitus ([place])  [id number]     3. Essential hypertension  I10     4. Hypertensive retinopathy of both eyes  H35.033     5. Epiretinal membrane (ERM) of right eye   H35.371     6. Pseudophakia of both eyes  Z96.1       1. BRVO w/ CME OD  - by history, pt states symptoms first noticed 2 wks prior to presentation, but reports changes may have occurred prior  - initial exam with differential tortuosity of vessels (OD > OS)  - FA (02.10.20) shows mild late staining / leakage in macula, staining / leakage of disc -- improving CME  - differential includes DM2 (DME), hypertensive retinopathy, inflammatory etiology / uveitis  - S/P IVA OD #1 (02.08.19), #2 (03.11.19), #3 (04.09.19), #4 (05.20.19), #5 (02.10.20)  - gave IVA OD on 2.10.20 due to pending Eylea4U for 2020 -- resulted in increased IRF/CME  - review of OCTs show persistent IRF and cystic changes --  resistance to IVA   - June 2019 -- switched therapies: S/P IVE OD #1 (06.24.19), #2 (07.24.19), #3 (09.04.19), #4 (10.30.19),#5 (12.30.19), #6 (03.23.20), #7 (05.05.20), #8 (07.16.20), #9 (07.17.20), #10 (08.28.20), #11 (10.13.20), # 12 (11.17.20), #13 (2.8.21), #14 (03.09.21), #15 (04.13.21), #16 (05.11.21), #17 (06.17.21), #18 (07.23.21), #19 (08.30.21), #20 (10.04.21), #21 (11.08.21), #22 (12.08.21), #23 (01.31.22), #24 (02.28.22), #25 (04.01.22), #26 (06.15.22), #27 (07.13.22), #28 (08.17.22), #29 (09.21.22), #30 (10.19.22), #31 (11.16.22), #32 (12.16.22), #33 (01.13.23)  - OCT today shows mild interval inc in cystic changes at 4 wks  - BCVA 20/30 -- stable  - Eylea4U benefits investigation completed and pt approved for IVE for 2022  - recommend IVE OD #34 today, 02.10.23 w/ f/u in 5-6 wks  - RBA of procedure discussed, questions answered  - informed consent obtained  - see procedure note  - Eylea informed consent form obtained and scanned on 11.19.2020  - f/u 5-6 weeks  -- DFE/OCT/possible injection  2. Mild nonproliferative diabetic retinopathy, both eyes  - A1c 6.0 on 10.14.22  - could be contributing to CME OD  - OS with minimal diabetic retinopathy  - continue to monitor   3,4. Hypertensive  retinopathy OU - stable  - as above, may have contributing to CME OD  - discussed importance of tight BP control  - monitor   5. Epiretinal membrane, right eye   - stable nasal ERM  - no indication for surgery at this time  6. Pseudophakia OU  - s/p CE[facility]IOL OU [facility]  - doing well  - monitor   Ophthalmic Meds Ordered this visit:  Meds ordered this encounter  Medications   aflibercept (EYLEA) SOLN 2 mg     Return for 5-6 wks - BRVO OD - , Dilated Exam, OCT, Possible Injxn.  This document serves as a record  of services personally performed by Gardiner Sleeper, MD, PhD. It was created on their behalf by Leonie Douglas, an ophthalmic technician. The creation of this record is the provider's dictation and/or activities during the visit.    Electronically signed by: Leonie Douglas COA, 02/12/21  4:05 PM  This document serves as a record of services personally performed by Gardiner Sleeper, MD, PhD. It was created on their behalf by San Jetty. Owens Shark, OA an ophthalmic technician. The creation of this record is the provider's dictation and/or activities during the visit.    Electronically signed by: San Jetty. [place], [place] 02.10.2023 4:05 PM  Gardiner Sleeper, M.D., Ph.D. Diseases & Surgery of the Retina and Vitreous [facility]  I have reviewed the above documentation for accuracy and completeness, and I agree with the above. Gardiner Sleeper, M.D., Ph.D. 02/12/21 4:07 PM  Abbreviations: M myopia (nearsighted); A astigmatism; H hyperopia (farsighted); P presbyopia; Mrx spectacle prescription;  CTL contact lenses; OD right eye; OS left eye; OU both eyes  XT exotropia; ET esotropia; PEK punctate epithelial keratitis; PEE punctate epithelial erosions; DES dry eye syndrome; MGD meibomian gland dysfunction; ATs artificial tears; PFAT's preservative free artificial tears; [place] nuclear sclerotic cataract; PSC posterior subcapsular cataract; ERM epi-retinal membrane;  PVD posterior vitreous detachment; RD retinal detachment; DM diabetes mellitus; DR diabetic retinopathy; NPDR non-proliferative diabetic retinopathy; PDR proliferative diabetic retinopathy; CSME clinically significant macular edema; DME diabetic macular edema; dbh dot blot hemorrhages; CWS cotton wool spot; POAG primary open angle glaucoma; C/D cup-to-disc ratio; HVF humphrey visual field; GVF goldmann visual field; OCT optical coherence tomography; IOP intraocular pressure; BRVO Branch retinal vein occlusion; CRVO central retinal vein occlusion; CRAO central retinal artery occlusion; BRAO branch retinal artery occlusion; RT retinal tear; SB scleral buckle; PPV pars plana vitrectomy; VH Vitreous hemorrhage; PRP panretinal laser photocoagulation; IVK intravitreal kenalog; VMT vitreomacular traction; MH Macular hole;  NVD neovascularization of the disc; NVE neovascularization elsewhere; AREDS age related eye disease study; ARMD age related macular degeneration; POAG primary open angle glaucoma; EBMD epithelial/anterior basement membrane dystrophy; ACIOL anterior chamber intraocular lens; IOL intraocular lens; PCIOL posterior chamber intraocular lens; Phaco/IOL phacoemulsification with intraocular lens placement; [place] photorefractive keratectomy; LASIK laser assisted in situ keratomileusis; HTN hypertension; DM diabetes mellitus; COPD chronic obstructive pulmonary disease

## 2021-02-12 ENCOUNTER — Ambulatory Visit (INDEPENDENT_AMBULATORY_CARE_PROVIDER_SITE_OTHER): Payer: Medicare Other | Admitting: Ophthalmology

## 2021-02-12 ENCOUNTER — Encounter (INDEPENDENT_AMBULATORY_CARE_PROVIDER_SITE_OTHER): Payer: Self-pay | Admitting: Ophthalmology

## 2021-02-12 ENCOUNTER — Other Ambulatory Visit: Payer: Self-pay

## 2021-02-12 DIAGNOSIS — I10 Essential (primary) hypertension: Secondary | ICD-10-CM

## 2021-02-12 DIAGNOSIS — H34831 Tributary (branch) retinal vein occlusion, right eye, with macular edema: Secondary | ICD-10-CM | POA: Diagnosis not present

## 2021-02-12 DIAGNOSIS — E113213 Type 2 diabetes mellitus with mild nonproliferative diabetic retinopathy with macular edema, bilateral: Secondary | ICD-10-CM

## 2021-02-12 DIAGNOSIS — Z961 Presence of intraocular lens: Secondary | ICD-10-CM

## 2021-02-12 DIAGNOSIS — H35033 Hypertensive retinopathy, bilateral: Secondary | ICD-10-CM

## 2021-02-12 DIAGNOSIS — H35371 Puckering of macula, right eye: Secondary | ICD-10-CM

## 2021-02-12 MED ORDER — AFLIBERCEPT 2MG/0.05ML IZ SOLN FOR KALEIDOSCOPE
2.0000 mg | INTRAVITREAL | Status: AC | PRN
  Administered 2021-02-12: 2 mg via INTRAVITREAL

## 2021-03-04 ENCOUNTER — Encounter: Payer: Self-pay | Admitting: Surgery

## 2021-03-04 ENCOUNTER — Other Ambulatory Visit: Payer: Self-pay

## 2021-03-04 ENCOUNTER — Other Ambulatory Visit: Payer: Self-pay | Admitting: Urology

## 2021-03-04 ENCOUNTER — Ambulatory Visit (INDEPENDENT_AMBULATORY_CARE_PROVIDER_SITE_OTHER): Payer: Medicare Other | Admitting: Surgery

## 2021-03-04 VITALS — BP 119/59 | HR 80 | Temp 99.0°F | Ht 65.0 in | Wt 161.0 lb

## 2021-03-04 DIAGNOSIS — N321 Vesicointestinal fistula: Secondary | ICD-10-CM

## 2021-03-04 DIAGNOSIS — Z09 Encounter for follow-up examination after completed treatment for conditions other than malignant neoplasm: Secondary | ICD-10-CM

## 2021-03-04 NOTE — Progress Notes (Signed)
[facility] ?POST-OP OFFICE VISIT ? ?03/04/2021 ? ?HPI: ?Cheryl Leonard is a 76 y.o. female 6 weeks s/p robotic sigmoid colectomy.  Patient's concerns today is that she noted some blood while wiping after bowel movement about 2 weeks ago.  She denies passing any clots or having any significant bleeding between bowel movements.  She denies of blood with the stools.  It was a one-time.  Her bigger concern is that she has some rectal urgency, having minimal warning.  Sometimes she is unaware whether she is having gas or urine.  She is currently taking Metamucil 3 times daily, and having more movements than she cares to have on a daily basis.  Otherwise she has had no recurrent symptoms of urinary tract infection.  And denies any abdominal pain. ? ?Vital signs: ?BP (!) 119/59   Pulse 80   Temp 99 ?F (37.2 ?C) (Oral)   Ht 5\' 5"  (1.651 m)   Wt 161 lb (73 kg)   SpO2 96%   BMI 26.79 kg/m?   ? ?Physical Exam: ?Constitutional: She appears well, jovial. ?Abdomen: Incisions of all healed nicely without any evidence of mass, or hernia present.  Abdomen is soft and unremarkable. ?Skin: Soft dry nontender. ? ?Assessment/Plan: ?This is a 76 y.o. female 6 weeks s/p robotic sigmoid colectomy with natural orifice extraction.  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 ? ?Patient Active Problem List  ? Diagnosis Date Noted  ? Gross hematuria 02/02/2021  ? 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 Rectal bleeding 08/11/2020  ? Colitis 08/11/2020  ? AKI (acute kidney injury) ([place]) 08/11/2020  ? Rectal bleed 08/11/2020  ? Acute kidney failure, unspecified ([place]) 08/11/2020  ? Amputated toe of right foot ([place]) 05/13/2020  ? Gangrene of toe of right foot ([place]) 05/04/2020  ? Major depressive disorder, recurrent, mild ([place]) 11/06/2019  ? Benign hypertensive kidney disease with chronic kidney disease 10/30/2019  ? Hyperkalemia 10/30/2019  ? Proteinuria 10/30/2019  ? History of 2019 novel  coronavirus disease (COVID-19) 02/20/2019  ? Anemia of chronic kidney failure, stage 3 (moderate) ([place]) 02/05/2019  ? Normocytic anemia 02/01/2019  ? Osteoporosis 11/15/2018  ? Atherosclerosis of native arteries of extremity with rest pain ([place]) 09/25/2018  ? 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 Wound disruption, post-op, skin, subsequent encounter 04/09/2018  ? Postoperative wound infection 03/21/2018  ? Atherosclerosis of artery of extremity with intermittent claudication ([place]) 03/08/2018  ? Atherosclerosis of native arteries of the extremities with ulceration ([place]) 10/13/2017  ? DM type 2 with diabetic peripheral neuropathy ([place]) 10/12/2017  ? History of hip fracture 10/12/2017  ? Type 2 diabetes mellitus with vascular disease ([place]) 10/12/2017  ? Carotid atherosclerosis, bilateral 07/11/2017  ? Medicare annual wellness visit, initial 05/03/2017  ? Peripheral vascular disease, unspecified ([place]) 03/01/2017  ? Diabetic ulcer of toe of left foot associated with type 2 diabetes mellitus ([place]) 03/01/2017  ? Chronic painful diabetic neuropathy ([place]) 06/02/2016  ? 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  ? Hip fracture ([place]) 10/30/2015  ? Greater trochanter fracture ([place]) 10/29/2015  ? Acquired hypothyroidism 10/16/2015  ? Stage 3b chronic kidney disease ([place]) 10/16/2015  ? Hyperlipidemia, mixed 04/16/2015  ? Benign essential hypertension 04/16/2015  ? Closed compression fracture of lumbar vertebra ([place]) 04/16/2015  ? Iron deficiency anemia 09/22/2014  ? Disease of thyroid gland 09/22/2014  ? Diabetes mellitus type 2, uncontrolled 05/13/2014  ? Lichen sclerosus [id number]  ? History of positive PPD 10/08/2013  ? ? -We discussed decreasing her daily fiber  supplementation.  Beginning with decreasing to twice daily and monitoring for 2 to 3 weeks with any changes, and perhaps continuing to decrease as indicated per her desires to diminish flatulence and possible rectal urgency.  We will have her back in  3 months and see how things improve with this. ? ? ?Ronny Bacon M.D., FACS ?03/04/2021, 11:28 AM  ?

## 2021-03-04 NOTE — Patient Instructions (Signed)
Please call our office if you have any questions or concerns. Cut back on the Metamucil to twice daily.  ?

## 2021-03-10 NOTE — Progress Notes (Shared)
[facility] Note  03/19/2021     CHIEF COMPLAINT Patient presents for No chief complaint on file.   HISTORY OF PRESENT ILLNESS: Cheryl Leonard is a 76 y.o. female who presents to the clinic today for:     Referring physician: Rusty Aus, MD [place],  [place] [address]  HISTORICAL INFORMATION:   Selected notes from the MEDICAL RECORD NUMBER Referred by Dr. Marvel Plan for concern of DME OD Lab Results  Component Value Date   HGBA1C 7.3 (H) 01/11/2021       CURRENT MEDICATIONS: No current outpatient medications on file. (Ophthalmic Drugs)   No current facility-administered medications for this visit. (Ophthalmic Drugs)   Current Outpatient Medications (Other)  Medication Sig   ALPRAZolam (XANAX) 0.25 MG tablet Take 0.25 mg by mouth daily as needed for anxiety.   amLODipine (NORVASC) 5 MG tablet Take 5 mg by mouth 2 (two) times daily.   aspirin EC 81 MG tablet Take 81 mg by mouth daily.    cholecalciferol (VITAMIN D) 1000 units tablet Take 1,000 Units by mouth 2 (two) times daily.   CORAL CALCIUM PO Take 1 tablet by mouth 2 (two) times daily.    denosumab (PROLIA) 60 MG/ML SOLN injection Inject 60 mg into the skin every 6 (six) months.    escitalopram (LEXAPRO) 10 MG tablet Take 10 mg by mouth daily.   famotidine (PEPCID) 40 MG tablet Take 40 mg by mouth daily.   furosemide (LASIX) 20 MG tablet Take 20 mg by mouth every Monday, Wednesday, and Friday.   gabapentin (NEURONTIN) 300 MG capsule Take one capsule at night as needed for your neuropathy foot pain.   HYDROcodone-acetaminophen (NORCO/VICODIN) 5-325 MG tablet Take 1 tablet by mouth every 6 (six) hours as needed for severe pain.   ibuprofen (ADVIL) 600 MG tablet Take 1 tablet (600 mg total) by mouth every 6 (six) hours as needed.   levothyroxine (SYNTHROID, LEVOTHROID) 100 MCG tablet Take 100 mcg by mouth daily before breakfast.     lidocaine-prilocaine (EMLA) cream Apply 1 application topically as needed (apply prior to port a cath access).   Magnesium 500 MG TABS Take 500 mg by mouth every morning.    metFORMIN (GLUCOPHAGE) 1000 MG tablet Take 1,000 mg by mouth 2 (two) times daily with a meal.   metoprolol succinate (TOPROL-XL) 50 MG 24 hr tablet Take 50 mg by mouth daily.   mirtazapine (REMERON) 15 MG tablet Take 15 mg by mouth daily as needed (Depression).   Multiple Vitamin (MULTIVITAMIN WITH MINERALS) TABS tablet Take 1 tablet by mouth daily. Centrum Silver   nystatin cream (MYCOSTATIN) Apply 1 application topically daily as needed (Yeast infection).   olmesartan (BENICAR) 20 MG tablet Take 20 mg by mouth daily.   pantoprazole sodium (PROTONIX) 40 mg Take 40 mg by mouth every morning.    rosuvastatin (CRESTOR) 20 MG tablet Take 20 mg by mouth every morning.   TRESIBA FLEXTOUCH 200 UNIT/ML SOPN Inject 25-30 Units as directed at bedtime. Titrate according to fasting blood glucose not to exceed 50 units a day   vitamin B-12 (CYANOCOBALAMIN) 1000 MCG tablet Take 1,000 mcg by mouth daily.   vitamin C (ASCORBIC ACID) 250 MG tablet Take 250 mg by mouth daily.   vitamin E 400 UNIT capsule Take 400 Units by mouth daily.   XARELTO 20 MG TABS tablet TAKE 1 TABLET BY MOUTH DAILY WITH SUPPER.   zolpidem (  AMBIEN) 10 MG tablet Take 10 mg by mouth at bedtime.   No current facility-administered medications for this visit. (Other)   REVIEW OF SYSTEMS:   ALLERGIES No Known Allergies  PAST MEDICAL HISTORY Past Medical History:  Diagnosis Date   Anemia    Anxiety    Arthritis    Gout   Cataracts, both eyes    Diabetic retinopathy ([place])    NPDR OU   Diverticulitis    GERD (gastroesophageal reflux disease)    Gout    Headache    h/o migraines   History of fracture of patella    right knee   History of positive PPD    Patient always shows positive   Hyperlipidemia    Hypertension    Hypertensive retinopathy    OU    Hypothyroidism    Lichen sclerosus [id number]   of vulva   Metatarsal fracture    Neuropathy    Osteopenia    Peripheral vascular disease (HCC)    Polyneuropathy    numbness and tingling in feet and toes   Renal insufficiency    Stage 3   Sleep apnea    does not use cpap-lost weight    Type 2 diabetes mellitus, uncontrolled    Past Surgical History:  Procedure Laterality Date   ABDOMINAL HYSTERECTOMY     AMPUTATION TOE Right 05/08/2020   Procedure: AMPUTATION TOE-Right 4th Toe;  Surgeon: Caroline More, DPM;  Location: [facility];  Service: Podiatry;  Laterality: Right;   APPENDECTOMY     BREAST REDUCTION SURGERY  2001   CATARACT EXTRACTION     CESAREAN SECTION  1976   COLONOSCOPY  03/05/2013   Nml - due for repeat 03/06/2018   COLONOSCOPY WITH PROPOFOL N/A 03/18/2019   Procedure: COLONOSCOPY WITH PROPOFOL;  Surgeon: Toledo, Benay Pike, MD;  Location: [facility];  Service: Gastroenterology;  Laterality: N/A;   DIAGNOSTIC LAPAROSCOPY     DILATION AND CURETTAGE OF UTERUS  1989   ENDARTERECTOMY FEMORAL Bilateral 03/09/2018   Procedure: ENDARTERECTOMY FEMORAL;  Surgeon: Algernon Huxley, MD;  Location: [facility];  Service: Vascular;  Laterality: Bilateral;   ENDARTERECTOMY POPLITEAL Left 03/09/2018   Procedure: ENDARTERECTOMY POPLITEAL AND SFA;  Surgeon: Algernon Huxley, MD;  Location: [facility];  Service: Vascular;  Laterality: Left;   ESOPHAGOGASTRODUODENOSCOPY  03/05/2013   ESOPHAGOGASTRODUODENOSCOPY (EGD) WITH PROPOFOL N/A 03/18/2019   Procedure: ESOPHAGOGASTRODUODENOSCOPY (EGD) WITH PROPOFOL;  Surgeon: Toledo, Benay Pike, MD;  Location: [facility];  Service: Gastroenterology;  Laterality: N/A;   EYE SURGERY     Eyelid Surgery  2012   INTRAMEDULLARY (IM) NAIL INTERTROCHANTERIC Left 10/30/2015   Procedure: INTRAMEDULLARY (IM) NAIL INTERTROCHANTRIC ;  Surgeon: Hessie Knows, MD;  Location: [facility];  Service: Orthopedics;  Laterality: Left;   KYPHOPLASTY N/A 10/25/2018   Procedure: L4  KYPHOPLASTY;  Surgeon: Hessie Knows, MD;  Location: [facility];  Service: Orthopedics;  Laterality: N/A;   LAPAROSCOPIC HYSTERECTOMY  2000   total   LOWER EXTREMITY ANGIOGRAPHY Left 03/08/2017   Procedure: LOWER EXTREMITY ANGIOGRAPHY;  Surgeon: Algernon Huxley, MD;  Location: [place] [facility];  Service: Cardiovascular;  Laterality: Left;   LOWER EXTREMITY ANGIOGRAPHY Left 10/30/2017   Procedure: LOWER EXTREMITY ANGIOGRAPHY;  Surgeon: Algernon Huxley, MD;  Location: [place] [facility];  Service: Cardiovascular;  Laterality: Left;   LOWER EXTREMITY ANGIOGRAPHY Right 03/08/2018   Procedure: LOWER EXTREMITY ANGIOGRAPHY;  Surgeon: Algernon Huxley, MD;  Location: [place] [facility];  Service: Cardiovascular;  Laterality:  Right;   LOWER EXTREMITY ANGIOGRAPHY Left 10/01/2018   Procedure: LOWER EXTREMITY ANGIOGRAPHY;  Surgeon: Algernon Huxley, MD;  Location: [place] [facility];  Service: Cardiovascular;  Laterality: Left;   LOWER EXTREMITY ANGIOGRAPHY Right 10/08/2018   Procedure: LOWER EXTREMITY ANGIOGRAPHY;  Surgeon: Algernon Huxley, MD;  Location: [place] [facility];  Service: Cardiovascular;  Laterality: Right;   LOWER EXTREMITY ANGIOGRAPHY Right 05/07/2020   Procedure: Lower Extremity Angiography;  Surgeon: Algernon Huxley, MD;  Location: [place] [facility];  Service: Cardiovascular;  Laterality: Right;   LYSIS OF ADHESION  01/25/2021   Procedure: LYSIS OF ADHESION;  Surgeon: Ronny Bacon, MD;  Location: [facility];  Service: General;;   PERIPHERAL VASCULAR INTERVENTION  03/08/2018   Procedure: PERIPHERAL VASCULAR INTERVENTION;  Surgeon: Algernon Huxley, MD;  Location: [place] [facility];  Service: Cardiovascular;;   PORTA CATH INSERTION N/A 02/17/2020   Procedure: PORTA CATH INSERTION;  Surgeon: Algernon Huxley, MD;  Location: [place] [facility];  Service: Cardiovascular;  Laterality: N/A;   REDUCTION MAMMAPLASTY  1997   SACROPLASTY N/A 10/25/2018   Procedure: S1 SACROPLASTY;  Surgeon: Hessie Knows, MD;   Location: [facility];  Service: Orthopedics;  Laterality: N/A;   FAMILY HISTORY Family History  Problem Relation Age of Onset   Coronary artery disease Father    Heart attack Father    Coronary artery disease Mother    Heart attack Mother    Ovarian cancer Sister 93       sister had hormonal therapy for IVF txs-which increased risk factor for ovarian cancer   Breast cancer Neg Hx    SOCIAL HISTORY Social History   Tobacco Use   Smoking status: Former    Packs/day: 1.00    Years: 20.00    Pack years: 20.00    Types: Cigarettes    Quit date: 03/07/1996    Years since quitting: 25.0   Smokeless tobacco: Never   Tobacco comments:    started smoking at age 31  Vaping Use   Vaping Use: Never used  Substance Use Topics   Alcohol use: No    Alcohol/week: 0.0 standard drinks   Drug use: No       OPHTHALMIC EXAM: Not recorded    IMAGING AND PROCEDURES  Imaging and Procedures for 04/25/17          ASSESSMENT/PLAN:    ICD-10-CM   1. Branch retinal vein occlusion of right eye with macular edema  H34.8310     2. Both eyes affected by mild nonproliferative diabetic retinopathy with macular edema, associated with type 2 diabetes mellitus ([place])  [id number]     3. Essential hypertension  I10     4. Hypertensive retinopathy of both eyes  H35.033     5. Epiretinal membrane (ERM) of right eye  H35.371     6. Pseudophakia of both eyes  Z96.1        1. BRVO w/ CME OD  - by history, pt states symptoms first noticed 2 wks prior to presentation, but reports changes may have occurred prior  - initial exam with differential tortuosity of vessels (OD > OS)  - FA (02.10.20) shows mild late staining / leakage in macula, staining / leakage of disc -- improving CME  - differential includes DM2 (DME), hypertensive retinopathy, inflammatory etiology / uveitis  - S/P IVA OD #1 (02.08.19), #2 (03.11.19), #3 (04.09.19), #4 (05.20.19), #5 (02.10.20)  - gave IVA OD on 2.10.20 due to pending  Eylea4U for 2020 -- resulted in increased IRF/CME  - review of OCTs show persistent IRF and cystic changes --  resistance to IVA   - June 2019 -- switched therapies: S/P IVE OD #1 (06.24.19), #2 (07.24.19), #3 (09.04.19), #4 (10.30.19),#5 (12.30.19), #6 (03.23.20), #7 (05.05.20), #8 (07.16.20), #9 (07.17.20), #10 (08.28.20), #11 (10.13.20), # 12 (11.17.20), #13 (2.8.21), #14 (03.09.21), #15 (04.13.21), #16 (05.11.21), #17 (06.17.21), #18 (07.23.21), #19 (08.30.21), #20 (10.04.21), #21 (11.08.21), #22 (12.08.21), #23 (01.31.22), #24 (02.28.22), #25 (04.01.22), #26 (06.15.22), #27 (07.13.22), #28 (08.17.22), #29 (09.21.22), #30 (10.19.22), #31 (11.16.22), #32 (12.16.22), #33 (01.13.23), #34 (02.10.23)  - OCT today shows mild interval inc in cystic changes at 4 wks  - BCVA 20/30 -- stable  - Eylea4U benefits investigation completed and pt approved for IVE for 2022  - recommend IVE OD #35 today, 03.17.23 w/ f/u in 5-6 wks  - RBA of procedure discussed, questions answered  - informed consent obtained  - see procedure note  - Eylea informed consent form obtained and scanned on 11.19.2020  - f/u 5-6 weeks  -- DFE/OCT/possible injection  2. Mild nonproliferative diabetic retinopathy, both eyes  - A1c 6.0 on 10.14.22  - could be contributing to CME OD  - OS with minimal diabetic retinopathy  - continue to monitor   3,4. Hypertensive retinopathy OU - stable  - as above, may have contributing to CME OD  - discussed importance of tight BP control  - monitor   5. Epiretinal membrane, right eye   - stable nasal ERM  - no indication for surgery at this time  6. Pseudophakia OU  - s/p CE/IOL OU by cataract surgeon in [facility]  - doing well  - monitor   Ophthalmic Meds Ordered this visit:  No orders of the defined types were placed in this encounter.    No follow-ups on file.  This document serves as a record of services personally performed by Gardiner Sleeper, MD, PhD. It was created on their behalf  by Leonie Douglas, an ophthalmic technician. The creation of this record is the provider's dictation and/or activities during the visit.    Electronically signed by: Leonie Douglas COA, 03/10/21  12:24 PM   Gardiner Sleeper, M.D., Ph.D. Diseases & Surgery of the Retina and Vitreous [facility]: M myopia (nearsighted); A astigmatism; H hyperopia (farsighted); P presbyopia; Mrx spectacle prescription;  CTL contact lenses; OD right eye; OS left eye; OU both eyes  XT exotropia; ET esotropia; PEK punctate epithelial keratitis; PEE punctate epithelial erosions; DES dry eye syndrome; MGD meibomian gland dysfunction; ATs artificial tears; PFAT's preservative free artificial tears; [place] nuclear sclerotic cataract; PSC posterior subcapsular cataract; ERM epi-retinal membrane; PVD posterior vitreous detachment; RD retinal detachment; DM diabetes mellitus; DR diabetic retinopathy; NPDR non-proliferative diabetic retinopathy; PDR proliferative diabetic retinopathy; CSME clinically significant macular edema; DME diabetic macular edema; dbh dot blot hemorrhages; CWS cotton wool spot; POAG primary open angle glaucoma; C/D cup-to-disc ratio; HVF humphrey visual field; GVF goldmann visual field; OCT optical coherence tomography; IOP intraocular pressure; BRVO Branch retinal vein occlusion; CRVO central retinal vein occlusion; CRAO central retinal artery occlusion; BRAO branch retinal artery occlusion; RT retinal tear; SB scleral buckle; PPV pars plana vitrectomy; VH Vitreous hemorrhage; PRP panretinal laser photocoagulation; IVK intravitreal kenalog; VMT vitreomacular traction; MH Macular hole;  NVD neovascularization of the disc; NVE neovascularization elsewhere; AREDS age related eye disease study; ARMD age related macular degeneration; POAG primary open angle glaucoma; EBMD epithelial/anterior  basement membrane dystrophy; ACIOL anterior chamber intraocular lens; IOL intraocular lens;  PCIOL posterior chamber intraocular lens; Phaco/IOL phacoemulsification with intraocular lens placement; [place] photorefractive keratectomy; LASIK laser assisted in situ keratomileusis; HTN hypertension; DM diabetes mellitus; COPD chronic obstructive pulmonary disease

## 2021-03-17 NOTE — Progress Notes (Signed)
?[facility] Note ? ?03/19/2021 ? ?  ? ?CHIEF COMPLAINT ?Patient presents for Retina Follow Up ? ? ?HISTORY OF PRESENT ILLNESS: ?Cheryl Leonard is a 75 y.o. female who presents to the clinic today for:  ? ?HPI   ? ? 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  Severity is moderate.  Duration of 5 weeks.  Since onset it is stable.  I, the attending physician,  performed the HPI with the patient and updated documentation appropriately. ? ?  ?  ? ? Comments   ?Pt here for 5 wk ret f/u BRVO OD. Pt states no VA changes, no issues.  ? ?  ?  ?Last edited by Bernarda Caffey, MD on 03/20/2021  2:58 PM.  ?  ? ?Referring physician: ?Rusty Aus, MD ?[address] ?[facility] ?[place],  [place] [address] ? ?HISTORICAL INFORMATION:  ? ?Selected notes from the [place] ?Referred by Dr. Marvel Plan for concern of DME OD ?Lab Results  ?Component Value Date  ? HGBA1C 7.3 (H) 01/11/2021  ? ? ?  ? ?CURRENT MEDICATIONS: ?No current outpatient medications on file. (Ophthalmic Drugs)  ? ?No current facility-administered medications for this visit. (Ophthalmic Drugs)  ? ?Current Outpatient Medications (Other)  ?Medication Sig  ? ALPRAZolam (XANAX) 0.25 MG tablet Take 0.25 mg by mouth daily as needed for anxiety.  ? amLODipine (NORVASC) 5 MG tablet Take 5 mg by mouth 2 (two) times daily.  ? aspirin EC 81 MG tablet Take 81 mg by mouth daily.   ? cholecalciferol (VITAMIN D) 1000 units tablet Take 1,000 Units by mouth 2 (two) times daily.  ? CORAL CALCIUM PO Take 1 tablet by mouth 2 (two) times daily.   ? denosumab (PROLIA) 60 MG/ML SOLN injection Inject 60 mg into the skin every 6 (six) months.   ? escitalopram (LEXAPRO) 10 MG tablet Take 10 mg by mouth daily.  ? famotidine (PEPCID) 40 MG tablet Take 40 mg by mouth daily.  ? furosemide (LASIX) 20 MG tablet Take 20 mg by mouth every Monday, Wednesday, and Friday.  ? gabapentin (NEURONTIN) 300 MG  capsule Take one capsule at night as needed for your neuropathy foot pain.  ? HYDROcodone-acetaminophen (NORCO/VICODIN) 5-325 MG tablet Take 1 tablet by mouth every 6 (six) hours as needed for severe pain.  ? ibuprofen (ADVIL) 600 MG tablet Take 1 tablet (600 mg total) by mouth every 6 (six) hours as needed.  ? levothyroxine (SYNTHROID, LEVOTHROID) 100 MCG tablet Take 100 mcg by mouth daily before breakfast.   ? lidocaine-prilocaine (EMLA) cream Apply 1 application topically as needed (apply prior to port a cath access).  ? Magnesium 500 MG TABS Take 500 mg by mouth every morning.   ? metFORMIN (GLUCOPHAGE) 1000 MG tablet Take 1,000 mg by mouth 2 (two) times daily with a meal.  ? Multiple Vitamin (MULTIVITAMIN WITH MINERALS) TABS tablet Take 1 tablet by mouth daily. Centrum Silver  ? nystatin cream (MYCOSTATIN) Apply 1 application topically daily as needed (Yeast infection).  ? olmesartan (BENICAR) 20 MG tablet Take 20 mg by mouth daily.  ? pantoprazole sodium (PROTONIX) 40 mg Take 40 mg by mouth every morning.   ? rosuvastatin (CRESTOR) 20 MG tablet Take 20 mg by mouth every morning.  ? TRESIBA FLEXTOUCH 200 UNIT/ML SOPN Inject 25-30 Units as directed at bedtime. Titrate according to fasting blood glucose not to exceed 50 units a  day  ? vitamin B-12 (CYANOCOBALAMIN) 1000 MCG tablet Take 1,000 mcg by mouth daily.  ? vitamin C (ASCORBIC ACID) 250 MG tablet Take 250 mg by mouth daily.  ? vitamin E 400 UNIT capsule Take 400 Units by mouth daily.  ? XARELTO 20 MG TABS tablet TAKE 1 TABLET BY MOUTH DAILY WITH SUPPER.  ? zolpidem (AMBIEN) 10 MG tablet Take 10 mg by mouth at bedtime.  ? metoprolol succinate (TOPROL-XL) 50 MG 24 hr tablet Take 50 mg by mouth daily.  ? mirtazapine (REMERON) 15 MG tablet Take 15 mg by mouth daily as needed (Depression).  ? ?No current facility-administered medications for this visit. (Other)  ? ?REVIEW OF SYSTEMS: ?ROS   ?Positive for: Gastrointestinal, Musculoskeletal, Endocrine,  Cardiovascular, Eyes, Respiratory ?Negative for: Constitutional, Neurological, Skin, Genitourinary, HENT, Psychiatric, Allergic/Imm, Heme/Lymph ?Last edited by Kingsley Spittle, COT on 03/19/2021  9:06 AM.  ?  ? ?ALLERGIES ?No Known Allergies ? ?PAST MEDICAL HISTORY ?Past Medical History:  ?Diagnosis Date  ? Anemia   ? Anxiety   ? Arthritis   ? Gout  ? Cataracts, both eyes   ? Diabetic retinopathy ([place])   ? NPDR OU  ? Diverticulitis   ? GERD (gastroesophageal reflux disease)   ? Gout   ? Headache   ? h/o migraines  ? History of fracture of patella   ? right knee  ? History of positive PPD   ? Patient always shows positive  ? Hyperlipidemia   ? Hypertension   ? Hypertensive retinopathy   ? OU  ? Hypothyroidism   ? Lichen sclerosus 33/29/5188  ? of vulva  ? Metatarsal fracture   ? Neuropathy   ? Osteopenia   ? Peripheral vascular disease ([place])   ? Polyneuropathy   ? numbness and tingling in feet and toes  ? Renal insufficiency   ? Stage 3  ? Sleep apnea   ? does not use cpap-lost weight   ? Type 2 diabetes mellitus, uncontrolled   ? ?Past Surgical History:  ?Procedure Laterality Date  ? ABDOMINAL HYSTERECTOMY    ? AMPUTATION TOE Right 05/08/2020  ? Procedure: AMPUTATION TOE-Right 4th Toe;  Surgeon: Caroline More, DPM;  Location: [facility];  Service: Podiatry;  Laterality: Right;  ? APPENDECTOMY    ? BREAST REDUCTION SURGERY  2001  ? CATARACT EXTRACTION    ? [place]  ? COLONOSCOPY  03/05/2013  ? Nml - due for repeat 03/06/2018  ? COLONOSCOPY WITH PROPOFOL N/A 03/18/2019  ? Procedure: COLONOSCOPY WITH PROPOFOL;  Surgeon: Toledo, Benay Pike, MD;  Location: [facility];  Service: Gastroenterology;  Laterality: N/A;  ? DIAGNOSTIC LAPAROSCOPY    ? [place] OF UTERUS  1989  ? ENDARTERECTOMY FEMORAL Bilateral 03/09/2018  ? Procedure: ENDARTERECTOMY FEMORAL;  Surgeon: Algernon Huxley, MD;  Location: [facility];  Service: Vascular;  Laterality: Bilateral;  ? ENDARTERECTOMY POPLITEAL Left 03/09/2018  ?  Procedure: ENDARTERECTOMY POPLITEAL AND SFA;  Surgeon: Algernon Huxley, MD;  Location: [facility];  Service: Vascular;  Laterality: Left;  ? ESOPHAGOGASTRODUODENOSCOPY  03/05/2013  ? ESOPHAGOGASTRODUODENOSCOPY (EGD) WITH PROPOFOL N/A 03/18/2019  ? Procedure: ESOPHAGOGASTRODUODENOSCOPY (EGD) WITH PROPOFOL;  Surgeon: Toledo, Benay Pike, MD;  Location: [facility];  Service: Gastroenterology;  Laterality: N/A;  ? EYE SURGERY    ? Eyelid Surgery  2012  ? INTRAMEDULLARY (IM) NAIL INTERTROCHANTERIC Left 10/30/2015  ? Procedure: INTRAMEDULLARY (IM) NAIL INTERTROCHANTRIC ;  Surgeon: Hessie Knows, MD;  Location: [facility];  Service: Orthopedics;  Laterality: Left;  ? KYPHOPLASTY N/A 10/25/2018  ? Procedure: L4 KYPHOPLASTY;  Surgeon: Hessie Knows, MD;  Location: [facility];  Service: Orthopedics;  Laterality: N/A;  ? LAPAROSCOPIC HYSTERECTOMY  2000  ? total  ? LOWER EXTREMITY ANGIOGRAPHY Left 03/08/2017  ? Procedure: LOWER EXTREMITY ANGIOGRAPHY;  Surgeon: Algernon Huxley, MD;  Location: [place] [facility];  Service: Cardiovascular;  Laterality: Left;  ? LOWER EXTREMITY ANGIOGRAPHY Left 10/30/2017  ? Procedure: LOWER EXTREMITY ANGIOGRAPHY;  Surgeon: Algernon Huxley, MD;  Location: [place] [facility];  Service: Cardiovascular;  Laterality: Left;  ? LOWER EXTREMITY ANGIOGRAPHY Right 03/08/2018  ? Procedure: LOWER EXTREMITY ANGIOGRAPHY;  Surgeon: Algernon Huxley, MD;  Location: [place] [facility];  Service: Cardiovascular;  Laterality: Right;  ? LOWER EXTREMITY ANGIOGRAPHY Left 10/01/2018  ? Procedure: LOWER EXTREMITY ANGIOGRAPHY;  Surgeon: Algernon Huxley, MD;  Location: [place] [facility];  Service: Cardiovascular;  Laterality: Left;  ? LOWER EXTREMITY ANGIOGRAPHY Right 10/08/2018  ? Procedure: LOWER EXTREMITY ANGIOGRAPHY;  Surgeon: Algernon Huxley, MD;  Location: [place] [facility];  Service: Cardiovascular;  Laterality: Right;  ? LOWER EXTREMITY ANGIOGRAPHY Right 05/07/2020  ? Procedure: Lower Extremity Angiography;  Surgeon: Algernon Huxley, MD;   Location: [place] [facility];  Service: Cardiovascular;  Laterality: Right;  ? LYSIS OF ADHESION  01/25/2021  ? Procedure: LYSIS OF ADHESION;  Surgeon: Ronny Bacon, MD;  Location: [facility];  Service: General;;  ? [place]

## 2021-03-19 ENCOUNTER — Other Ambulatory Visit: Payer: Self-pay

## 2021-03-19 ENCOUNTER — Ambulatory Visit (INDEPENDENT_AMBULATORY_CARE_PROVIDER_SITE_OTHER): Payer: Medicare Other | Admitting: Ophthalmology

## 2021-03-19 ENCOUNTER — Encounter (INDEPENDENT_AMBULATORY_CARE_PROVIDER_SITE_OTHER): Payer: Self-pay | Admitting: Ophthalmology

## 2021-03-19 ENCOUNTER — Encounter (INDEPENDENT_AMBULATORY_CARE_PROVIDER_SITE_OTHER): Payer: Medicare Other | Admitting: Ophthalmology

## 2021-03-19 DIAGNOSIS — I10 Essential (primary) hypertension: Secondary | ICD-10-CM

## 2021-03-19 DIAGNOSIS — H34831 Tributary (branch) retinal vein occlusion, right eye, with macular edema: Secondary | ICD-10-CM

## 2021-03-19 DIAGNOSIS — E113213 Type 2 diabetes mellitus with mild nonproliferative diabetic retinopathy with macular edema, bilateral: Secondary | ICD-10-CM | POA: Diagnosis not present

## 2021-03-19 DIAGNOSIS — H35371 Puckering of macula, right eye: Secondary | ICD-10-CM

## 2021-03-19 DIAGNOSIS — Z961 Presence of intraocular lens: Secondary | ICD-10-CM

## 2021-03-19 DIAGNOSIS — H35033 Hypertensive retinopathy, bilateral: Secondary | ICD-10-CM

## 2021-03-20 ENCOUNTER — Encounter (INDEPENDENT_AMBULATORY_CARE_PROVIDER_SITE_OTHER): Payer: Self-pay | Admitting: Ophthalmology

## 2021-03-20 MED ORDER — AFLIBERCEPT 2MG/0.05ML IZ SOLN FOR KALEIDOSCOPE
2.0000 mg | INTRAVITREAL | Status: AC | PRN
  Administered 2021-03-20: 2 mg via INTRAVITREAL

## 2021-03-30 ENCOUNTER — Ambulatory Visit (INDEPENDENT_AMBULATORY_CARE_PROVIDER_SITE_OTHER): Payer: Medicare Other | Admitting: Vascular Surgery

## 2021-04-12 ENCOUNTER — Encounter: Payer: Self-pay | Admitting: Internal Medicine

## 2021-04-12 ENCOUNTER — Inpatient Hospital Stay: Payer: Medicare Other

## 2021-04-12 ENCOUNTER — Inpatient Hospital Stay: Payer: Medicare Other | Attending: Internal Medicine

## 2021-04-12 ENCOUNTER — Inpatient Hospital Stay (HOSPITAL_BASED_OUTPATIENT_CLINIC_OR_DEPARTMENT_OTHER): Payer: Medicare Other | Admitting: Internal Medicine

## 2021-04-12 ENCOUNTER — Other Ambulatory Visit: Payer: Self-pay | Admitting: Podiatry

## 2021-04-12 DIAGNOSIS — N183 Chronic kidney disease, stage 3 unspecified: Secondary | ICD-10-CM | POA: Diagnosis not present

## 2021-04-12 DIAGNOSIS — Z79899 Other long term (current) drug therapy: Secondary | ICD-10-CM | POA: Diagnosis not present

## 2021-04-12 DIAGNOSIS — D649 Anemia, unspecified: Secondary | ICD-10-CM

## 2021-04-12 DIAGNOSIS — D509 Iron deficiency anemia, unspecified: Secondary | ICD-10-CM | POA: Diagnosis not present

## 2021-04-12 LAB — CBC WITH DIFFERENTIAL/PLATELET
Abs Immature Granulocytes: 0.03 10*3/uL (ref 0.00–0.07)
Basophils Absolute: 0.1 10*3/uL (ref 0.0–0.1)
Basophils Relative: 1 %
Eosinophils Absolute: 0.2 10*3/uL (ref 0.0–0.5)
Eosinophils Relative: 3 %
HCT: 26.3 % — ABNORMAL LOW (ref 36.0–46.0)
Hemoglobin: 8.2 g/dL — ABNORMAL LOW (ref 12.0–15.0)
Immature Granulocytes: 1 %
Lymphocytes Relative: 23 %
Lymphs Abs: 1.5 10*3/uL (ref 0.7–4.0)
MCH: 27.9 pg (ref 26.0–34.0)
MCHC: 31.2 g/dL (ref 30.0–36.0)
MCV: 89.5 fL (ref 80.0–100.0)
Monocytes Absolute: 0.9 10*3/uL (ref 0.1–1.0)
Monocytes Relative: 14 %
Neutro Abs: 3.9 10*3/uL (ref 1.7–7.7)
Neutrophils Relative %: 58 %
Platelets: 294 10*3/uL (ref 150–400)
RBC: 2.94 MIL/uL — ABNORMAL LOW (ref 3.87–5.11)
RDW: 15.6 % — ABNORMAL HIGH (ref 11.5–15.5)
WBC: 6.5 10*3/uL (ref 4.0–10.5)
nRBC: 0 % (ref 0.0–0.2)

## 2021-04-12 LAB — BASIC METABOLIC PANEL
Anion gap: 6 (ref 5–15)
BUN: 32 mg/dL — ABNORMAL HIGH (ref 8–23)
CO2: 22 mmol/L (ref 22–32)
Calcium: 9.2 mg/dL (ref 8.9–10.3)
Chloride: 105 mmol/L (ref 98–111)
Creatinine, Ser: 1.53 mg/dL — ABNORMAL HIGH (ref 0.44–1.00)
GFR, Estimated: 35 mL/min — ABNORMAL LOW (ref >60)
Glucose, Bld: 140 mg/dL — ABNORMAL HIGH (ref 70–99)
Potassium: 4.9 mmol/L (ref 3.5–5.1)
Sodium: 133 mmol/L — ABNORMAL LOW (ref 135–145)

## 2021-04-12 LAB — IRON AND TIBC
Iron: 28 ug/dL (ref 28–170)
Saturation Ratios: 8 % — ABNORMAL LOW (ref 10.4–31.8)
TIBC: 350 ug/dL (ref 250–450)
UIBC: 322 ug/dL

## 2021-04-12 LAB — FERRITIN: Ferritin: 12 ng/mL (ref 11–307)

## 2021-04-12 MED ORDER — HEPARIN SOD (PORK) LOCK FLUSH 100 UNIT/ML IV SOLN
500.0000 [IU] | Freq: Once | INTRAVENOUS | Status: AC | PRN
  Administered 2021-04-12: 500 [IU]
  Filled 2021-04-12: qty 5

## 2021-04-12 MED ORDER — SODIUM CHLORIDE 0.9 % IV SOLN
Freq: Once | INTRAVENOUS | Status: AC
  Filled 2021-04-12: qty 250

## 2021-04-12 MED ORDER — IRON SUCROSE 20 MG/ML IV SOLN
200.0000 mg | Freq: Once | INTRAVENOUS | Status: AC
  Administered 2021-04-12: 200 mg via INTRAVENOUS
  Filled 2021-04-12: qty 10

## 2021-04-12 NOTE — Progress Notes (Signed)
[facility] ?CONSULT NOTE ? ?Patient Care Team: ?Rusty Aus, MD as PCP - General (Internal Medicine) ?Josefine Class, MD as Referring Physician (Gastroenterology) ?Cammie Sickle, MD as Consulting Physician (Hematology and Oncology) ? ?CHIEF COMPLAINTS/PURPOSE OF CONSULTATION: Anemia ? ?HEMATOLOGY HISTORY ? ?# ANEMIA- Jan 2021- 8.8/ferritin 11 [PCP]; N-WBC/platelets? IDA vs other- EGD-2015/colonoscopy-? 2015; 2020- [Dr.Skulskie] ; capsule-2016- ? 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 ?# CKD- stage III [GFR-40s; OCT 2021- Dr.Kolluru];  PVD- toe amputation for gangrene.  ? ?HISTORY OF PRESENTING ILLNESS: Ambulating independently.  Alone. ? ?Cheryl Leonard 76 y.o.  female anemia iron deficiency-question CKD-III s here for follow-up.  ? ?Patient states that she is awaiting amputation of the left toe; also awaiting evaluation with vascular. ? ?Complains of mild to moderate fatigue.  Denies any blood in stools or black-colored stools. ? ?Review of Systems  ?Constitutional:  Positive for malaise/fatigue. Negative for chills, diaphoresis and fever.  ?HENT:  Negative for nosebleeds and sore throat.   ?Eyes:  Negative for double vision.  ?Respiratory:  Positive for shortness of breath. Negative for cough, hemoptysis, sputum production and wheezing.   ?Cardiovascular:  Negative for chest pain, palpitations, orthopnea and leg swelling.  ?Gastrointestinal:  Negative for abdominal pain, blood in stool, constipation, diarrhea, heartburn, melena, nausea and vomiting.  ?Genitourinary:  Negative for dysuria, frequency and urgency.  ?Musculoskeletal:  Positive for joint pain.  ?Skin: Negative.  Negative for itching and rash.  ?Neurological:  Negative for dizziness, tingling, focal weakness, weakness and headaches.  ?Endo/Heme/Allergies:  Does not bruise/bleed easily.  ?Psychiatric/Behavioral:  Negative for depression. The patient is  not nervous/anxious and does not have insomnia.   ? ?MEDICAL HISTORY:  ?Past Medical History:  ?Diagnosis Date  ? Anemia   ? Anxiety   ? Arthritis   ? Gout  ? Cataracts, both eyes   ? Diabetic retinopathy ([place])   ? NPDR OU  ? Diverticulitis   ? GERD (gastroesophageal reflux disease)   ? Gout   ? Headache   ? h/o migraines  ? History of fracture of patella   ? right knee  ? History of positive PPD   ? Patient always shows positive  ? Hyperlipidemia   ? Hypertension   ? Hypertensive retinopathy   ? OU  ? Hypothyroidism   ? Lichen sclerosus 94/85/4627  ? of vulva  ? Metatarsal fracture   ? Neuropathy   ? Osteopenia   ? Peripheral vascular disease ([place])   ? Polyneuropathy   ? numbness and tingling in feet and toes  ? Renal insufficiency   ? Stage 3  ? Sleep apnea   ? does not use cpap-lost weight   ? Type 2 diabetes mellitus, uncontrolled   ? ? ?SURGICAL HISTORY: ?Past Surgical History:  ?Procedure Laterality Date  ? ABDOMINAL HYSTERECTOMY    ? AMPUTATION TOE Right 05/08/2020  ? Procedure: AMPUTATION TOE-Right 4th Toe;  Surgeon: Caroline More, DPM;  Location: [facility];  Service: Podiatry;  Laterality: Right;  ? APPENDECTOMY    ? BREAST REDUCTION SURGERY  2001  ? CATARACT EXTRACTION    ? [place]  ? COLONOSCOPY  03/05/2013  ? Nml - due for repeat 03/06/2018  ? COLONOSCOPY WITH PROPOFOL N/A 03/18/2019  ? Procedure: COLONOSCOPY WITH PROPOFOL;  Surgeon: Toledo, Benay Pike, MD;  Location: [facility];  Service: Gastroenterology;  Laterality: N/A;  ? DIAGNOSTIC LAPAROSCOPY    ?  [place] OF UTERUS  1989  ? ENDARTERECTOMY FEMORAL Bilateral 03/09/2018  ? Procedure: ENDARTERECTOMY FEMORAL;  Surgeon: Algernon Huxley, MD;  Location: [facility];  Service: Vascular;  Laterality: Bilateral;  ? ENDARTERECTOMY POPLITEAL Left 03/09/2018  ? Procedure: ENDARTERECTOMY POPLITEAL AND SFA;  Surgeon: Algernon Huxley, MD;  Location: [facility];  Service: Vascular;  Laterality: Left;  ? ESOPHAGOGASTRODUODENOSCOPY  03/05/2013  ?  ESOPHAGOGASTRODUODENOSCOPY (EGD) WITH PROPOFOL N/A 03/18/2019  ? Procedure: ESOPHAGOGASTRODUODENOSCOPY (EGD) WITH PROPOFOL;  Surgeon: Toledo, Benay Pike, MD;  Location: [facility];  Service: Gastroenterology;  Laterality: N/A;  ? EYE SURGERY    ? Eyelid Surgery  2012  ? INTRAMEDULLARY (IM) NAIL INTERTROCHANTERIC Left 10/30/2015  ? Procedure: INTRAMEDULLARY (IM) NAIL INTERTROCHANTRIC ;  Surgeon: Hessie Knows, MD;  Location: [facility];  Service: Orthopedics;  Laterality: Left;  ? KYPHOPLASTY N/A 10/25/2018  ? Procedure: L4 KYPHOPLASTY;  Surgeon: Hessie Knows, MD;  Location: [facility];  Service: Orthopedics;  Laterality: N/A;  ? LAPAROSCOPIC HYSTERECTOMY  2000  ? total  ? LOWER EXTREMITY ANGIOGRAPHY Left 03/08/2017  ? Procedure: LOWER EXTREMITY ANGIOGRAPHY;  Surgeon: Algernon Huxley, MD;  Location: [facility] [facility];  Service: Cardiovascular;  Laterality: Left;  ? LOWER EXTREMITY ANGIOGRAPHY Left 10/30/2017  ? Procedure: LOWER EXTREMITY ANGIOGRAPHY;  Surgeon: Algernon Huxley, MD;  Location: [place] [facility];  Service: Cardiovascular;  Laterality: Left;  ? LOWER EXTREMITY ANGIOGRAPHY Right 03/08/2018  ? Procedure: LOWER EXTREMITY ANGIOGRAPHY;  Surgeon: Algernon Huxley, MD;  Location: [place];  Service: Cardiovascular;  Laterality: Right;  ? LOWER EXTREMITY ANGIOGRAPHY Left 10/01/2018  ? Procedure: LOWER EXTREMITY ANGIOGRAPHY;  Surgeon: Algernon Huxley, MD;  Location: [place] [facility];  Service: Cardiovascular;  Laterality: Left;  ? LOWER EXTREMITY ANGIOGRAPHY Right 10/08/2018  ? Procedure: LOWER EXTREMITY ANGIOGRAPHY;  Surgeon: Algernon Huxley, MD;  Location: [place] [facility];  Service: Cardiovascular;  Laterality: Right;  ? LOWER EXTREMITY ANGIOGRAPHY Right 05/07/2020  ? Procedure: Lower Extremity Angiography;  Surgeon: Algernon Huxley, MD;  Location: [place] [facility];  Service: Cardiovascular;  Laterality: Right;  ? LYSIS OF ADHESION  01/25/2021  ? Procedure: LYSIS OF ADHESION;  Surgeon: Ronny Bacon, MD;   Location: [facility];  Service: General;;  ? PERIPHERAL VASCULAR INTERVENTION  03/08/2018  ? Procedure: PERIPHERAL VASCULAR INTERVENTION;  Surgeon: Algernon Huxley, MD;  Location: [place] [facility];  Service: Cardiovascular;;  ? PORTA CATH INSERTION N/A 02/17/2020  ? Procedure: PORTA CATH INSERTION;  Surgeon: Algernon Huxley, MD;  Location: [place];  Service: Cardiovascular;  Laterality: N/A;  ? REDUCTION MAMMAPLASTY  1997  ? SACROPLASTY N/A 10/25/2018  ? Procedure: S1 SACROPLASTY;  Surgeon: Hessie Knows, MD;  Location: [facility];  Service: Orthopedics;  Laterality: N/A;  ? ? ?SOCIAL HISTORY: ?Social History  ? ?Socioeconomic History  ? Marital status: Married  ?  Spouse name: Jenny Reichmann  ? Number of children: 3  ? Years of education: Not on file  ? Highest education level: Not on file  ?Occupational History  ? Occupation: Retail banker  ?Tobacco Use  ? Smoking status: Former  ?  Packs/day: 1.00  ?  Years: 20.00  ?  Pack years: 20.00  ?  Types: Cigarettes  ?  Quit date: 03/07/1996  ?  Years since quitting: 25.1  ? Smokeless tobacco: Never  ? Tobacco comments:  ?  started smoking at age 39  ?Vaping Use  ? Vaping Use: Never used  ?  Substance and Sexual Activity  ? Alcohol use: No  ?  Alcohol/week: 0.0 standard drinks  ? Drug use: No  ? Sexual activity: Yes  ?  Partners: Male  ?  Birth control/protection: Surgical  ?Other Topics Concern  ? Not on file  ?Social History Narrative  ? Lives in [place]; with husband; quit > 20 years; no alcohol; used to work at [employer] at [employer].   ? ?Social Determinants of Health  ? ?Financial Resource Strain: Not on file  ?Food Insecurity: Not on file  ?Transportation Needs: Not on file  ?Physical Activity: Not on file  ?Stress: Not on file  ?Social Connections: Not on file  ?Intimate Partner Violence: Not on file  ? ? ?FAMILY HISTORY: ?Family History  ?Problem Relation Age of Onset  ? Coronary artery disease Father   ? Heart attack Father   ? Coronary artery disease Mother   ? Heart  attack Mother   ? Ovarian cancer Sister 41  ?     sister had hormonal therapy for IVF txs-which increased risk factor for ovarian cancer  ? Breast cancer Neg Hx   ? ? ?ALLERGIES:  has No Known Allergies. ? ?ME

## 2021-04-12 NOTE — Assessment & Plan Note (Addendum)
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  Improved s/p IV iron infusions however today- Hb 8.2. Proceed with Venofer today. ? ?# Discussed use of erythropoietin stimulating agents like retacrit to stimulate the bone marrow.  Discussed the potential issues with erythropoietin estimating agents-given the risk of stroke thromboembolic events/elevated blood pressure.  However, most of the serious events did not happen when the goal hematocrit is 33/hemoglobin 30.  Await repeat blood work in 3 months; if hemoglobin consistently low-recommend starting retacrit.   ? ?#Diabetes/complications [id number] stable; Gangrene Right toes s/p amputation [May 2022]- on xarelto; awaiting left toe amputation/awaiting evaluation with vascular, Dr.Dew.  ? ?# Etiology-CKD-III; GFR ~35 overall -stable [Dr.Kolluru]; recommend continued hydration. ? ?#Poor IV access/Mediport placement-stable ? ?# DISPOSITION:  ?# Venofer today;  ?# venofer in 1 weeks from now- ?# follow up 3 months- MD; port; labs- cbc/bmp/iron studies/ferritin-;possible venofer/retacrit- Dr.B ? ?

## 2021-04-12 NOTE — Patient Instructions (Signed)
[facility]  Discharge Instructions: °Thank you for choosing [facility] to provide your oncology and hematology care.  °If you have a lab appointment with the Cancer Center, please go directly to the Cancer Center and check in at the registration area. ° °Wear comfortable clothing and clothing appropriate for easy access to any Portacath or PICC line.  ° °We strive to give you quality time with your provider. You may need to reschedule your appointment if you arrive late (15 or more minutes).  Arriving late affects you and other patients whose appointments are after yours.  Also, if you miss three or more appointments without notifying the office, you may be dismissed from the clinic at the provider’s discretion.    °  °For prescription refill requests, have your pharmacy contact our office and allow 72 hours for refills to be completed.   ° °Today you received the following : Venofer °  °To help prevent nausea and vomiting after your treatment, we encourage you to take your nausea medication as directed. ° °BELOW ARE SYMPTOMS THAT SHOULD BE REPORTED IMMEDIATELY: °*FEVER GREATER THAN 100.4 F (38 °C) OR HIGHER °*CHILLS OR SWEATING °*NAUSEA AND VOMITING THAT IS NOT CONTROLLED WITH YOUR NAUSEA MEDICATION °*UNUSUAL SHORTNESS OF BREATH °*UNUSUAL BRUISING OR BLEEDING °*URINARY PROBLEMS (pain or burning when urinating, or frequent urination) °*BOWEL PROBLEMS (unusual diarrhea, constipation, pain near the anus) °TENDERNESS IN MOUTH AND THROAT WITH OR WITHOUT PRESENCE OF ULCERS (sore throat, sores in mouth, or a toothache) °UNUSUAL RASH, SWELLING OR PAIN  °UNUSUAL VAGINAL DISCHARGE OR ITCHING  ° °Items with * indicate a potential emergency and should be followed up as soon as possible or go to the Emergency Department if any problems should occur. ° °Please show the CHEMOTHERAPY ALERT CARD or IMMUNOTHERAPY ALERT CARD at check-in to the Emergency Department and triage  nurse. ° °Should you have questions after your visit or need to cancel or reschedule your appointment, please contact [facility]  [phone] and follow the prompts.  Office hours are 8:00 a.m. to 4:30 p.m. Monday - Friday. Please note that voicemails left after 4:00 p.m. may not be returned until the following business day.  We are closed weekends and major holidays. You have access to a nurse at all times for urgent questions. Please call the main number to the clinic [phone] and follow the prompts. ° °For any non-urgent questions, you may also contact your provider using MyChart. We now offer e-Visits for anyone 18 and older to request care online for non-urgent symptoms. For details visit [email or web address]. °  °Also download the MyChart app! Go to the app store, search "MyChart", open the app, select [facility], and log in with your MyChart username and password. ° °Due to Covid, a mask is required upon entering the hospital/clinic. If you do not have a mask, one will be given to you upon arrival. For doctor visits, patients may have 1 support person aged 18 or older with them. For treatment visits, patients cannot have anyone with them due to current Covid guidelines and our immunocompromised population.  °

## 2021-04-13 ENCOUNTER — Encounter (INDEPENDENT_AMBULATORY_CARE_PROVIDER_SITE_OTHER): Payer: Self-pay | Admitting: Nurse Practitioner

## 2021-04-13 ENCOUNTER — Ambulatory Visit (INDEPENDENT_AMBULATORY_CARE_PROVIDER_SITE_OTHER): Payer: Medicare Other

## 2021-04-13 ENCOUNTER — Ambulatory Visit (INDEPENDENT_AMBULATORY_CARE_PROVIDER_SITE_OTHER): Payer: Medicare Other | Admitting: Nurse Practitioner

## 2021-04-13 VITALS — BP 127/67 | HR 89 | Resp 17 | Ht 63.0 in | Wt 161.8 lb

## 2021-04-13 DIAGNOSIS — E782 Mixed hyperlipidemia: Secondary | ICD-10-CM | POA: Diagnosis not present

## 2021-04-13 DIAGNOSIS — E1159 Type 2 diabetes mellitus with other circulatory complications: Secondary | ICD-10-CM

## 2021-04-13 DIAGNOSIS — I739 Peripheral vascular disease, unspecified: Secondary | ICD-10-CM

## 2021-04-14 ENCOUNTER — Telehealth (INDEPENDENT_AMBULATORY_CARE_PROVIDER_SITE_OTHER): Payer: Self-pay

## 2021-04-14 ENCOUNTER — Encounter
Admission: RE | Admit: 2021-04-14 | Discharge: 2021-04-14 | Disposition: A | Discharge status: Home / Self Care | Payer: Medicare Other | Source: Ambulatory Visit | Attending: Podiatry | Admitting: Podiatry

## 2021-04-14 ENCOUNTER — Other Ambulatory Visit: Payer: Self-pay

## 2021-04-14 VITALS — BP 131/55 | HR 100 | Temp 98.4°F | Resp 18 | Ht 65.0 in | Wt 161.9 lb

## 2021-04-14 DIAGNOSIS — Z01818 Encounter for other preprocedural examination: Secondary | ICD-10-CM | POA: Insufficient documentation

## 2021-04-14 DIAGNOSIS — E1159 Type 2 diabetes mellitus with other circulatory complications: Secondary | ICD-10-CM | POA: Insufficient documentation

## 2021-04-14 DIAGNOSIS — Z01810 Encounter for preprocedural cardiovascular examination: Secondary | ICD-10-CM | POA: Diagnosis not present

## 2021-04-14 LAB — CBC
HCT: 28.6 % — ABNORMAL LOW (ref 36.0–46.0)
Hemoglobin: 8.9 g/dL — ABNORMAL LOW (ref 12.0–15.0)
MCH: 27.6 pg (ref 26.0–34.0)
MCHC: 31.1 g/dL (ref 30.0–36.0)
MCV: 88.5 fL (ref 80.0–100.0)
Platelets: 318 10*3/uL (ref 150–400)
RBC: 3.23 MIL/uL — ABNORMAL LOW (ref 3.87–5.11)
RDW: 15.7 % — ABNORMAL HIGH (ref 11.5–15.5)
WBC: 7.5 10*3/uL (ref 4.0–10.5)
nRBC: 0 % (ref 0.0–0.2)

## 2021-04-14 LAB — BASIC METABOLIC PANEL
Anion gap: 9 (ref 5–15)
BUN: 23 mg/dL (ref 8–23)
CO2: 24 mmol/L (ref 22–32)
Calcium: 9.9 mg/dL (ref 8.9–10.3)
Chloride: 105 mmol/L (ref 98–111)
Creatinine, Ser: 1.39 mg/dL — ABNORMAL HIGH (ref 0.44–1.00)
GFR, Estimated: 39 mL/min — ABNORMAL LOW (ref >60)
Glucose, Bld: 165 mg/dL — ABNORMAL HIGH (ref 70–99)
Potassium: 4.3 mmol/L (ref 3.5–5.1)
Sodium: 138 mmol/L (ref 135–145)

## 2021-04-14 NOTE — Patient Instructions (Addendum)
Your procedure is scheduled on: 04/22/2021  ?Report to the Registration Desk on the 1st floor of the Medical [facility]. ?To find out your arrival time, please call [phone] between 1PM - 3PM on: 04/21/2021  ? ?REMEMBER: ?Instructions that are not followed completely may result in serious medical risk, up to and including death; or upon the discretion of your surgeon and anesthesiologist your surgery may need to be rescheduled. ? ?Do not eat food after midnight the night before surgery.  ?No gum chewing, lozengers or hard candies. ? ?Type 1 and Type 2 diabetics should only drink water. ? ?In addition, your doctor has ordered for you to drink the provided  ?Gatorade G2 ?Drinking this carbohydrate drink up to two hours before surgery helps to reduce insulin resistance and improve patient outcomes. Please complete drinking 2 hours prior to scheduled arrival time. ? ?TAKE THESE MEDICATIONS THE MORNING OF SURGERY WITH A SIP OF WATER: ?Alprazolam ?Amlodipine ?Lexapro ?Pepcid ?Synthroid ?Protonix-take one the night before surgery as well.  ?7. metoprolol ? ? ?**Follow new guidelines for insulin and diabetes medications.**  ?Do not take metFORMIN (GLUCOPHAGE) on 4/18,4/19/. ?Take half dose only of TRESIBA FLEXTOUCH 200 UNIT/ML SOPN at bedtime.  ? ?Follow recommendations from Cardiologist, PCP, or Surgeon regarding stopping Xarelto and aspirin The Pre-admission testing  RN instructed you to reach out to the appropriate Physician to get advise regarding Xarelto and aspirin . Please remember the dates of the last dose for all of your medications. The Pre-op RN will ask you these dates.  ? ?One week prior to surgery: ?Stop Anti-inflammatories (NSAIDS) such as Advil, Aleve, Ibuprofen, Motrin, Naproxen, Naprosyn and Aspirin based products such as Excedrin, Goodys Powder, BC Powder. ?Stop ANY OVER THE COUNTER supplements until after surgery like VitD, calcium, Vit E,,Vit B12, Vit C, magnesium,and multivitamin ? ?You may however,  continue to take Tylenol if needed for pain up until the day of surgery. ? ?No Alcohol for 24 hours before or after surgery. ? ?No Smoking including e-cigarettes for 24 hours prior to surgery.  ?No chewable tobacco products for at least 6 hours prior to surgery.  ?No nicotine patches on the day of surgery. ? ?Do not use any "recreational" drugs for at least a week prior to your surgery.  ?Please be advised that the combination of cocaine and anesthesia may have negative outcomes, up to and including death. ?If you test positive for cocaine, your surgery will be cancelled. ? ?On the morning of surgery brush your teeth with toothpaste and water, you may rinse your mouth with mouthwash if you wish. ?Do not swallow any toothpaste or mouthwash. ? ?Use CHG Soap or wipes as directed on instruction sheet-provided for you ? ?Do not wear jewelry, make-up, hairpins, clips or nail polish. ? ?Do not wear lotions, powders, or perfumes.  ? ?Do not shave body from the neck down 48 hours prior to surgery just in case you cut yourself which could leave a site for infection.  ?Also, freshly shaved skin may become irritated if using the CHG soap. ? ?Contact lenses, hearing aids and dentures may not be worn into surgery. ? ?Do not bring valuables to the hospital. [facility] is not responsible for any missing/lost belongings or valuables.  ? ? ? ?Notify your doctor if there is any change in your medical condition (cold, fever, infection). ? ?Wear comfortable clothing (specific to your surgery type) to the hospital. ? ?After surgery, you can help prevent lung complications by doing breathing exercises.  ?  Take deep breaths and cough every 1-2 hours. Your doctor may order a device called an Incentive Spirometer to help you take deep breaths. ?If you are being admitted to the hospital overnight, leave your suitcase in the car. ?After surgery it may be brought to your room. ? ?If you are being discharged the day of surgery, you will not be  allowed to drive home. ?You will need a responsible adult (18 years or older) to drive you home and stay with you that night.  ? ?If you are taking public transportation, you will need to have a responsible adult (18 years or older) with you. ?Please confirm with your physician that it is acceptable to use public transportation.  ? ?Please call the Pre-admissions Testing Dept. at [phone] if you have any questions about these instructions. ? ?Surgery Visitation Policy: ? ?Patients undergoing a surgery or procedure may have two family members or support persons with them as long as the person is not COVID-19 positive or experiencing its symptoms.  ? ?

## 2021-04-14 NOTE — Telephone Encounter (Signed)
Patient called wanting to know about stopping her blood thinners for her surgery on 04/22/21. I explained that if the surgeon is requesting her to discontinue her blood thinners for a short time, then the office would need to send over a clearance sheet to be signed and faxed back. ?

## 2021-04-15 ENCOUNTER — Encounter (INDEPENDENT_AMBULATORY_CARE_PROVIDER_SITE_OTHER): Payer: Self-pay | Admitting: Nurse Practitioner

## 2021-04-15 NOTE — Progress Notes (Signed)
? ?Subjective:  ? ? Patient ID: Cheryl Leonard, female    DOB: [birth year], 76 y.o.   MRN: [id number] ?No chief complaint on file. ? ? ?Cheryl Leonard is a 76 year old female that presents today for evaluation due to upcoming amputation of her left fourth toe.  The patient has an extensive history of peripheral arterial disease with interventions.  Currently she denies any issues with claudication or rest pain.  She is compliant on all medications.  The patient had an x-ray on 04/13/2019 which showed some early osteomyelitis changes.  Currently patient has no signs symptoms of systemic infection. ? ?Today noninvasive study showed an ABI of 1.15 on the right and 1.22 on the left.  The patient had a TBI of 0.93 on the right and 0.94 on the left.  She had triphasic tibial artery waveforms bilaterally with normal great toe waveforms bilaterally, the left fourth digit also had strong toe waveforms as well. ? ? ?Review of Systems  ?Skin:  Positive for wound.  ?All other systems reviewed and are negative. ? ?   ?Objective:  ? Physical Exam ?Vitals reviewed.  ?HENT:  ?   Head: Normocephalic.  ?Cardiovascular:  ?   Rate and Rhythm: Normal rate and regular rhythm.  ?   Pulses:     ?     Dorsalis pedis pulses are detected w/ Doppler on the right side and detected w/ Doppler on the left side.  ?     Posterior tibial pulses are 1+ on the right side and detected w/ Doppler on the left side.  ?Pulmonary:  ?   Effort: Pulmonary effort is normal.  ?Skin: ?   General: Skin is warm and dry.  ?Neurological:  ?   Mental Status: She is alert and oriented to person, place, and time.  ?Psychiatric:     ?   Mood and Affect: Mood normal.     ?   Behavior: Behavior normal.     ?   Thought Content: Thought content normal.     ?   Judgment: Judgment normal.  ? ? ?BP 127/67 (BP Location: Left Arm)   Pulse 89   Resp 17   Ht 5\' 3"  (1.6 m)   Wt 161 lb 12.8 oz (73.4 kg)   BMI 28.66 kg/m?  ? ?Past Medical History:  ?Diagnosis Date  ? Anemia    ? Anxiety   ? Arthritis   ? Gout  ? Cataracts, both eyes   ? Diabetic retinopathy ([place])   ? NPDR OU  ? Diverticulitis   ? GERD (gastroesophageal reflux disease)   ? Gout   ? Headache   ? h/o migraines  ? History of fracture of patella   ? right knee  ? History of positive PPD   ? Patient always shows positive  ? Hyperlipidemia   ? Hypertension   ? Hypertensive retinopathy   ? OU  ? Hypothyroidism   ? Lichen sclerosus [id number]  ? of vulva  ? Metatarsal fracture   ? Neuropathy   ? Osteopenia   ? Peripheral vascular disease ([place])   ? Polyneuropathy   ? numbness and tingling in feet and toes  ? Renal insufficiency   ? Stage 3  ? Sleep apnea   ? does not use cpap-lost weight   ? Type 2 diabetes mellitus, uncontrolled   ? ? ?Social History  ? ?Socioeconomic History  ? Marital status: Married  ?  Spouse name: Jenny Reichmann  ? Number of children: 3  ?  Years of education: Not on file  ? Highest education level: Not on file  ?Occupational History  ? Occupation: Retail banker  ?Tobacco Use  ? Smoking status: Former  ?  Packs/day: 1.00  ?  Years: 20.00  ?  Pack years: 20.00  ?  Types: Cigarettes  ?  Quit date: 03/07/1996  ?  Years since quitting: 25.1  ? Smokeless tobacco: Never  ? Tobacco comments:  ?  started smoking at age 24 but stopped smoking in 2000  ?Vaping Use  ? Vaping Use: Never used  ?Substance and Sexual Activity  ? Alcohol use: No  ?  Alcohol/week: 0.0 standard drinks  ? Drug use: No  ? Sexual activity: Yes  ?  Partners: Male  ?  Birth control/protection: Surgical  ?Other Topics Concern  ? Not on file  ?Social History Narrative  ? Lives in [place]; with husband; quit > 20 years; no alcohol; used to work at [employer]  at [employer].   ? ?Social Determinants of Health  ? ?Financial Resource Strain: Not on file  ?Food Insecurity: Not on file  ?Transportation Needs: Not on file  ?Physical Activity: Not on file  ?Stress: Not on file  ?Social Connections: Not on file  ?Intimate Partner Violence: Not on file  ? ? ?Past  Surgical History:  ?Procedure Laterality Date  ? ABDOMINAL HYSTERECTOMY    ? AMPUTATION TOE Right 05/08/2020  ? Procedure: AMPUTATION TOE-Right 4th Toe;  Surgeon: Caroline More, DPM;  Location: [facility];  Service: Podiatry;  Laterality: Right;  ? APPENDECTOMY    ? BREAST REDUCTION SURGERY  2001  ? CATARACT EXTRACTION    ? [place]  ? COLONOSCOPY  03/05/2013  ? Nml - due for repeat 03/06/2018  ? COLONOSCOPY WITH PROPOFOL N/A 03/18/2019  ? Procedure: COLONOSCOPY WITH PROPOFOL;  Surgeon: Toledo, Benay Pike, MD;  Location: [facility];  Service: Gastroenterology;  Laterality: N/A;  ? DIAGNOSTIC LAPAROSCOPY    ? [place] OF UTERUS  1989  ? ENDARTERECTOMY FEMORAL Bilateral 03/09/2018  ? Procedure: ENDARTERECTOMY FEMORAL;  Surgeon: Algernon Huxley, MD;  Location: [facility];  Service: Vascular;  Laterality: Bilateral;  ? ENDARTERECTOMY POPLITEAL Left 03/09/2018  ? Procedure: ENDARTERECTOMY POPLITEAL AND SFA;  Surgeon: Algernon Huxley, MD;  Location: [facility];  Service: Vascular;  Laterality: Left;  ? ESOPHAGOGASTRODUODENOSCOPY  03/05/2013  ? ESOPHAGOGASTRODUODENOSCOPY (EGD) WITH PROPOFOL N/A 03/18/2019  ? Procedure: ESOPHAGOGASTRODUODENOSCOPY (EGD) WITH PROPOFOL;  Surgeon: Toledo, Benay Pike, MD;  Location: [facility];  Service: Gastroenterology;  Laterality: N/A;  ? EYE SURGERY    ? Eyelid Surgery  2012  ? INTRAMEDULLARY (IM) NAIL INTERTROCHANTERIC Left 10/30/2015  ? Procedure: INTRAMEDULLARY (IM) NAIL INTERTROCHANTRIC ;  Surgeon: Hessie Knows, MD;  Location: [facility];  Service: Orthopedics;  Laterality: Left;  ? KYPHOPLASTY N/A 10/25/2018  ? Procedure: L4 KYPHOPLASTY;  Surgeon: Hessie Knows, MD;  Location: [facility];  Service: Orthopedics;  Laterality: N/A;  ? LAPAROSCOPIC HYSTERECTOMY  2000  ? total  ? LOWER EXTREMITY ANGIOGRAPHY Left 03/08/2017  ? Procedure: LOWER EXTREMITY ANGIOGRAPHY;  Surgeon: Algernon Huxley, MD;  Location: [place] [facility];  Service: Cardiovascular;  Laterality: Left;  ? LOWER EXTREMITY  ANGIOGRAPHY Left 10/30/2017  ? Procedure: LOWER EXTREMITY ANGIOGRAPHY;  Surgeon: Algernon Huxley, MD;  Location: [place] [facility];  Service: Cardiovascular;  Laterality: Left;  ? LOWER EXTREMITY ANGIOGRAPHY Right 03/08/2018  ? Procedure: LOWER EXTREMITY ANGIOGRAPHY;  Surgeon: Algernon Huxley, MD;  Location: [place] [facility];  Service: Cardiovascular;  Laterality: Right;  ? LOWER EXTREMITY ANGIOGRAPHY Left 10/01/2018  ? Procedure: LOWER EXTREMITY ANGIOGRAPHY;  Surgeon: Algernon Huxley, MD;  Location: [place] [facility];  Service: Cardiovascular;  Laterality: Left;  ? LOWER EXTREMITY ANGIOGRAPHY Right 10/08/2018  ? Procedure: LOWER EXTREMITY ANGIOGRAPHY;  Surgeon: Algernon Huxley, MD;  Location: [place] [facility];  Service: Cardiovascular;  Laterality: Right;  ? LOWER EXTREMITY ANGIOGRAPHY Right 05/07/2020  ? Procedure: Lower Extremity Angiography;  Surgeon: Algernon Huxley, MD;  Location: [place] [facility];  Service: Cardiovascular;  Laterality: Right;  ? LYSIS OF ADHESION  01/25/2021  ? Procedure: LYSIS OF ADHESION;  Surgeon: Ronny Bacon, MD;  Location: [facility];  Service: General;;  ? PERIPHERAL VASCULAR INTERVENTION  03/08/2018  ? Procedure: PERIPHERAL VASCULAR INTERVENTION;  Surgeon: Algernon Huxley, MD;  Location: [place] [facility];  Service: Cardiovascular;;  ? PORTA CATH INSERTION N/A 02/17/2020  ? Procedure: PORTA CATH INSERTION;  Surgeon: Algernon Huxley, MD;  Location: [place] [facility];  Service: Cardiovascular;  Laterality: N/A;  ? REDUCTION MAMMAPLASTY  1997  ? SACROPLASTY N/A 10/25/2018  ? Procedure: S1 SACROPLASTY;  Surgeon: Hessie Knows, MD;  Location: [facility];  Service: Orthopedics;  Laterality: N/A;  ? ? ?Family History  ?Problem Relation Age of Onset  ? Coronary artery disease Father   ? Heart attack Father   ? Coronary artery disease Mother   ? Heart attack Mother   ? Ovarian cancer Sister 17  ?     sister had hormonal therapy for IVF txs-which increased risk factor for ovarian cancer  ? Breast  cancer Neg Hx   ? ? ?No Known Allergies ? ? ?  Latest Ref Rng & Units 04/14/2021  ? 11:35 AM 04/12/2021  ?  2:50 PM 01/26/2021  ?  4:21 AM  ?CBC  ?WBC 4.0 - 10.5 K/uL 7.5   6.5   11.5    ?Hemoglobin 12.0 - 15.0

## 2021-04-15 NOTE — Progress Notes (Signed)
?[facility] Note ? ?04/16/2021 ? ?  ? ?CHIEF COMPLAINT ?Patient presents for Retina Follow Up ? ? ?HISTORY OF PRESENT ILLNESS: ?Cheryl Leonard is a 76 y.o. female who presents to the clinic today for:  ? ?HPI   ? ? 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  This started 4 weeks ago.  I, the attending physician,  performed the HPI with the patient and updated documentation appropriately. ? ?  ?  ? ? Comments   ?Patient here for 4 weeks retina follow up for BRVO OD. Patient states vision distance is going a little bit. Looking at clock was blurry. Usually can read the clock. No eye pain. Going to have surgery on left foot. Removing a toe.  ? ?  ?  ?Last edited by Bernarda Caffey, MD on 04/18/2021 10:11 PM.  ?  ? ? ?Referring physician: ?Rusty Aus, MD ?[facility] ?[place],  [place] [address] ? ?HISTORICAL INFORMATION:  ? ?Selected notes from the [facility] ?Referred by Dr. Marvel Plan for concern of DME OD ?Lab Results  ?Component Value Date  ? HGBA1C 7.3 (H) 01/11/2021  ? ? ?  ? ?CURRENT MEDICATIONS: ?No current outpatient medications on file. (Ophthalmic Drugs)  ? ?No current facility-administered medications for this visit. (Ophthalmic Drugs)  ? ?Current Outpatient Medications (Other)  ?Medication Sig  ? ALPRAZolam (XANAX) 0.25 MG tablet Take 0.25 mg by mouth daily as needed for anxiety.  ? amLODipine (NORVASC) 5 MG tablet Take 5 mg by mouth 2 (two) times daily.  ? amoxicillin-clavulanate (AUGMENTIN) 875-125 MG tablet Take 1 tablet by mouth 2 (two) times daily.  ? aspirin EC 81 MG tablet Take 81 mg by mouth daily.   ? cholecalciferol (VITAMIN D) 1000 units tablet Take 1,000 Units by mouth 2 (two) times daily.  ? CORAL CALCIUM PO Take 1 tablet by mouth 2 (two) times daily.   ? denosumab (PROLIA) 60 MG/ML SOLN injection Inject 60 mg into the skin every 6 (six) months.   ? escitalopram (LEXAPRO) 10 MG tablet  Take 10 mg by mouth daily.  ? estradiol (ESTRACE) 0.1 MG/GM vaginal cream Place 1 Applicatorful vaginally daily as needed (vaginal irritation).  ? famotidine (PEPCID) 40 MG tablet Take 40 mg by mouth daily.  ? furosemide (LASIX) 20 MG tablet Take 20 mg by mouth every Wednesday.  ? gabapentin (NEURONTIN) 300 MG capsule Take 300 mg by mouth at bedtime as needed (pain).  ? icosapent Ethyl (VASCEPA) 1 g capsule Take 2 g by mouth 2 (two) times daily.  ? levothyroxine (SYNTHROID, LEVOTHROID) 100 MCG tablet Take 100 mcg by mouth daily before breakfast.   ? lidocaine-prilocaine (EMLA) cream Apply 1 application topically as needed (apply prior to port a cath access).  ? Magnesium 500 MG TABS Take 500 mg by mouth every morning.   ? metFORMIN (GLUCOPHAGE) 1000 MG tablet Take 1,000 mg by mouth 2 (two) times daily with a meal.  ? metoprolol succinate (TOPROL-XL) 50 MG 24 hr tablet Take 50 mg by mouth daily. Take with or immediately following a meal.  ? mirtazapine (REMERON) 15 MG tablet Take 15 mg by mouth as needed.  ? Multiple Vitamin (MULTIVITAMIN WITH MINERALS) TABS tablet Take 1 tablet by mouth daily. Centrum Silver  ? nystatin cream (MYCOSTATIN) Apply 1 application topically daily as needed (Yeast infection).  ? olmesartan (BENICAR) 20 MG tablet Take 20 mg  by mouth daily.  ? pantoprazole sodium (PROTONIX) 40 mg Take 40 mg by mouth every morning.   ? rosuvastatin (CRESTOR) 20 MG tablet Take 20 mg by mouth every morning.  ? TRESIBA FLEXTOUCH 200 UNIT/ML SOPN Inject 25-30 Units as directed at bedtime. Titrate according to fasting blood glucose not to exceed 50 units a day  ? vitamin B-12 (CYANOCOBALAMIN) 1000 MCG tablet Take 1,000 mcg by mouth daily.  ? vitamin C (ASCORBIC ACID) 250 MG tablet Take 250 mg by mouth daily.  ? vitamin E 400 UNIT capsule Take 400 Units by mouth daily.  ? XARELTO 20 MG TABS tablet TAKE 1 TABLET BY MOUTH DAILY WITH SUPPER.  ? zolpidem (AMBIEN) 10 MG tablet Take 10 mg by mouth at bedtime.  ?  HYDROcodone-acetaminophen (NORCO/VICODIN) 5-325 MG tablet Take 1 tablet by mouth every 6 (six) hours as needed for severe pain. (Patient not taking: Reported on 04/12/2021)  ? ibuprofen (ADVIL) 600 MG tablet Take 1 tablet (600 mg total) by mouth every 6 (six) hours as needed. (Patient not taking: Reported on 04/12/2021)  ? ?No current facility-administered medications for this visit. (Other)  ? ?REVIEW OF SYSTEMS: ?ROS   ?Positive for: Gastrointestinal, Musculoskeletal, Endocrine, Cardiovascular, Eyes, Respiratory ?Negative for: Constitutional, Neurological, Skin, Genitourinary, HENT, Psychiatric, Allergic/Imm, Heme/Lymph ?Last edited by Theodore Demark, COA on 04/16/2021  2:21 PM.  ?  ? ? ?ALLERGIES ?No Known Allergies ? ?PAST MEDICAL HISTORY ?Past Medical History:  ?Diagnosis Date  ? Anemia   ? Anxiety   ? Arthritis   ? Gout  ? Cataracts, both eyes   ? Diabetic retinopathy ([place])   ? NPDR OU  ? Diverticulitis   ? GERD (gastroesophageal reflux disease)   ? Gout   ? Headache   ? h/o migraines  ? History of fracture of patella   ? right knee  ? History of positive PPD   ? Patient always shows positive  ? Hyperlipidemia   ? Hypertension   ? Hypertensive retinopathy   ? OU  ? Hypothyroidism   ? Lichen sclerosus [id number]  ? of vulva  ? Metatarsal fracture   ? Neuropathy   ? Osteopenia   ? Peripheral vascular disease ([place])   ? Polyneuropathy   ? numbness and tingling in feet and toes  ? Renal insufficiency   ? Stage 3  ? Sleep apnea   ? does not use cpap-lost weight   ? Type 2 diabetes mellitus, uncontrolled   ? ?Past Surgical History:  ?Procedure Laterality Date  ? ABDOMINAL HYSTERECTOMY    ? AMPUTATION TOE Right 05/08/2020  ? Procedure: AMPUTATION TOE-Right 4th Toe;  Surgeon: Caroline More, DPM;  Location: [facility];  Service: Podiatry;  Laterality: Right;  ? APPENDECTOMY    ? BREAST REDUCTION SURGERY  2001  ? CATARACT EXTRACTION    ? [place]  ? COLONOSCOPY  03/05/2013  ? Nml - due for repeat 03/06/2018  ?  COLONOSCOPY WITH PROPOFOL N/A 03/18/2019  ? Procedure: COLONOSCOPY WITH PROPOFOL;  Surgeon: Toledo, Benay Pike, MD;  Location: [facility];  Service: Gastroenterology;  Laterality: N/A;  ? DIAGNOSTIC LAPAROSCOPY    ? [place] OF UTERUS  1989  ? ENDARTERECTOMY FEMORAL Bilateral 03/09/2018  ? Procedure: ENDARTERECTOMY FEMORAL;  Surgeon: Algernon Huxley, MD;  Location: [facility];  Service: Vascular;  Laterality: Bilateral;  ? ENDARTERECTOMY POPLITEAL Left 03/09/2018  ? Procedure: ENDARTERECTOMY POPLITEAL AND SFA;  Surgeon: Algernon Huxley, MD;  Location: [facility];  Service:  Vascular;  Laterality: Left;  ? ESOPHAGOGASTRODUODENOSCOPY  03/05/2013  ? ESOPHAGOGASTRODUODENOSCOPY (EGD) WITH PROPOFOL N/A 03/18/2019  ? Procedure: ESOPHAGOGASTRODUODENOSCOPY (EGD) WITH PROPOFOL;  Surgeon: Toledo, Benay Pike, MD;  Location: [facility];  Service: Gastroenterology;  Laterality: N/A;  ? EYE SURGERY    ? Eyelid Surgery  2012  ? INTRAMEDULLARY (IM) NAIL INTERTROCHANTERIC Left 10/30/2015  ? Procedure: INTRAMEDULLARY (IM) NAIL INTERTROCHANTRIC ;  Surgeon: Hessie Knows, MD;  Location: [facility];  Service: Orthopedics;  Laterality: Left;  ? KYPHOPLASTY N/A 10/25/2018  ? Procedure: L4 KYPHOPLASTY;  Surgeon: Hessie Knows, MD;  Location: [facility];  Service: Orthopedics;  Laterality: N/A;  ? LAPAROSCOPIC HYSTERECTOMY  2000  ? total  ? LOWER EXTREMITY ANGIOGRAPHY Left 03/08/2017  ? Procedure: LOWER EXTREMITY ANGIOGRAPHY;  Surgeon: Algernon Huxley, MD;  Location: [place] [facility];  Service: Cardiovascular;  Laterality: Left;  ? LOWER EXTREMITY ANGIOGRAPHY Left 10/30/2017  ? Procedure: LOWER EXTREMITY ANGIOGRAPHY;  Surgeon: Algernon Huxley, MD;  Location: [place] [facility];  Service: Cardiovascular;  Laterality: Left;  ? LOWER EXTREMITY ANGIOGRAPHY Right 03/08/2018  ? Procedure: LOWER EXTREMITY ANGIOGRAPHY;  Surgeon: Algernon Huxley, MD;  Location: [place] [facility];  Service: Cardiovascular;  Laterality: Right;  ? LOWER EXTREMITY ANGIOGRAPHY  Left 10/01/2018  ? Procedure: LOWER EXTREMITY ANGIOGRAPHY;  Surgeon: Algernon Huxley, MD;  Location: [place] [facility];  Service: Cardiovascular;  Laterality: Left;  ? LOWER EXTREMITY ANGIOGRAPHY Right 10/08/2018

## 2021-04-16 ENCOUNTER — Ambulatory Visit (INDEPENDENT_AMBULATORY_CARE_PROVIDER_SITE_OTHER): Payer: Medicare Other | Admitting: Ophthalmology

## 2021-04-16 ENCOUNTER — Encounter (INDEPENDENT_AMBULATORY_CARE_PROVIDER_SITE_OTHER): Payer: Self-pay | Admitting: Ophthalmology

## 2021-04-16 DIAGNOSIS — H35033 Hypertensive retinopathy, bilateral: Secondary | ICD-10-CM

## 2021-04-16 DIAGNOSIS — I10 Essential (primary) hypertension: Secondary | ICD-10-CM

## 2021-04-16 DIAGNOSIS — H35371 Puckering of macula, right eye: Secondary | ICD-10-CM

## 2021-04-16 DIAGNOSIS — Z961 Presence of intraocular lens: Secondary | ICD-10-CM

## 2021-04-16 DIAGNOSIS — E113213 Type 2 diabetes mellitus with mild nonproliferative diabetic retinopathy with macular edema, bilateral: Secondary | ICD-10-CM | POA: Diagnosis not present

## 2021-04-16 DIAGNOSIS — H34831 Tributary (branch) retinal vein occlusion, right eye, with macular edema: Secondary | ICD-10-CM

## 2021-04-18 ENCOUNTER — Encounter (INDEPENDENT_AMBULATORY_CARE_PROVIDER_SITE_OTHER): Payer: Self-pay | Admitting: Ophthalmology

## 2021-04-18 MED ORDER — AFLIBERCEPT 2MG/0.05ML IZ SOLN FOR KALEIDOSCOPE
2.0000 mg | INTRAVITREAL | Status: AC | PRN
  Administered 2021-04-18: 2 mg via INTRAVITREAL

## 2021-04-19 ENCOUNTER — Inpatient Hospital Stay: Payer: Medicare Other

## 2021-04-19 VITALS — BP 122/54 | HR 67 | Temp 97.9°F | Resp 20

## 2021-04-19 DIAGNOSIS — D509 Iron deficiency anemia, unspecified: Secondary | ICD-10-CM | POA: Diagnosis not present

## 2021-04-19 DIAGNOSIS — D631 Anemia in chronic kidney disease: Secondary | ICD-10-CM

## 2021-04-19 MED ORDER — SODIUM CHLORIDE 0.9% FLUSH
10.0000 mL | Freq: Once | INTRAVENOUS | Status: AC | PRN
  Administered 2021-04-19: 10 mL
  Filled 2021-04-19: qty 10

## 2021-04-19 MED ORDER — IRON SUCROSE 20 MG/ML IV SOLN
200.0000 mg | Freq: Once | INTRAVENOUS | Status: AC
  Administered 2021-04-19: 200 mg via INTRAVENOUS
  Filled 2021-04-19: qty 10

## 2021-04-19 MED ORDER — SODIUM CHLORIDE 0.9 % IV SOLN
Freq: Once | INTRAVENOUS | Status: AC
  Filled 2021-04-19: qty 250

## 2021-04-19 MED ORDER — HEPARIN SOD (PORK) LOCK FLUSH 100 UNIT/ML IV SOLN
500.0000 [IU] | Freq: Once | INTRAVENOUS | Status: AC | PRN
  Administered 2021-04-19: 500 [IU]
  Filled 2021-04-19: qty 5

## 2021-04-22 ENCOUNTER — Ambulatory Visit
Admission: RE | Admit: 2021-04-22 | Discharge: 2021-04-22 | Disposition: A | Discharge status: Home / Self Care | Payer: Medicare Other | Attending: Podiatry | Admitting: Podiatry

## 2021-04-22 ENCOUNTER — Ambulatory Visit: Payer: Medicare Other | Admitting: Anesthesiology

## 2021-04-22 ENCOUNTER — Other Ambulatory Visit: Payer: Self-pay

## 2021-04-22 ENCOUNTER — Encounter: Payer: Self-pay | Admitting: Podiatry

## 2021-04-22 ENCOUNTER — Encounter
Admission: RE | Disposition: A | Discharge status: Home / Self Care | Payer: Self-pay | Source: Home / Self Care | Attending: Podiatry

## 2021-04-22 DIAGNOSIS — E1169 Type 2 diabetes mellitus with other specified complication: Secondary | ICD-10-CM | POA: Insufficient documentation

## 2021-04-22 DIAGNOSIS — I10 Essential (primary) hypertension: Secondary | ICD-10-CM | POA: Insufficient documentation

## 2021-04-22 DIAGNOSIS — D759 Disease of blood and blood-forming organs, unspecified: Secondary | ICD-10-CM | POA: Diagnosis not present

## 2021-04-22 DIAGNOSIS — E1151 Type 2 diabetes mellitus with diabetic peripheral angiopathy without gangrene: Secondary | ICD-10-CM | POA: Diagnosis not present

## 2021-04-22 DIAGNOSIS — E669 Obesity, unspecified: Secondary | ICD-10-CM | POA: Insufficient documentation

## 2021-04-22 DIAGNOSIS — E1142 Type 2 diabetes mellitus with diabetic polyneuropathy: Secondary | ICD-10-CM | POA: Insufficient documentation

## 2021-04-22 DIAGNOSIS — M869 Osteomyelitis, unspecified: Secondary | ICD-10-CM | POA: Insufficient documentation

## 2021-04-22 DIAGNOSIS — H3530 Unspecified macular degeneration: Secondary | ICD-10-CM | POA: Insufficient documentation

## 2021-04-22 DIAGNOSIS — E11621 Type 2 diabetes mellitus with foot ulcer: Secondary | ICD-10-CM | POA: Insufficient documentation

## 2021-04-22 DIAGNOSIS — E039 Hypothyroidism, unspecified: Secondary | ICD-10-CM | POA: Insufficient documentation

## 2021-04-22 DIAGNOSIS — D649 Anemia, unspecified: Secondary | ICD-10-CM | POA: Insufficient documentation

## 2021-04-22 DIAGNOSIS — L97529 Non-pressure chronic ulcer of other part of left foot with unspecified severity: Secondary | ICD-10-CM | POA: Insufficient documentation

## 2021-04-22 DIAGNOSIS — Z6826 Body mass index (BMI) 26.0-26.9, adult: Secondary | ICD-10-CM | POA: Insufficient documentation

## 2021-04-22 DIAGNOSIS — Z794 Long term (current) use of insulin: Secondary | ICD-10-CM | POA: Insufficient documentation

## 2021-04-22 DIAGNOSIS — M86172 Other acute osteomyelitis, left ankle and foot: Secondary | ICD-10-CM

## 2021-04-22 DIAGNOSIS — M1990 Unspecified osteoarthritis, unspecified site: Secondary | ICD-10-CM | POA: Diagnosis not present

## 2021-04-22 DIAGNOSIS — Z87891 Personal history of nicotine dependence: Secondary | ICD-10-CM | POA: Diagnosis not present

## 2021-04-22 HISTORY — PX: AMPUTATION TOE: SHX6595

## 2021-04-22 LAB — GLUCOSE, CAPILLARY
Glucose-Capillary: 165 mg/dL — ABNORMAL HIGH (ref 70–99)
Glucose-Capillary: 158 mg/dL — ABNORMAL HIGH (ref 70–99)

## 2021-04-22 SURGERY — AMPUTATION, TOE
Anesthesia: General | Site: Toe | Laterality: Left

## 2021-04-22 MED ORDER — FENTANYL CITRATE (PF) 100 MCG/2ML IJ SOLN
INTRAMUSCULAR | Status: AC
  Filled 2021-04-22: qty 2

## 2021-04-22 MED ORDER — ASPIRIN EC 81 MG PO TBEC: 81.0000 mg | DELAYED_RELEASE_TABLET | Freq: Every day | ORAL | 11 refills | Status: AC

## 2021-04-22 MED ORDER — MIDAZOLAM HCL 2 MG/2ML IJ SOLN
INTRAMUSCULAR | Status: DC | PRN
  Administered 2021-04-22: 2 mg via INTRAVENOUS

## 2021-04-22 MED ORDER — BUPIVACAINE HCL 0.5 % IJ SOLN
INTRAMUSCULAR | Status: DC | PRN
  Administered 2021-04-22: 20 mL

## 2021-04-22 MED ORDER — PROMETHAZINE HCL 25 MG/ML IJ SOLN: 6.2500 mg | INTRAMUSCULAR | Status: DC | PRN

## 2021-04-22 MED ORDER — CHLORHEXIDINE GLUCONATE 0.12 % MT SOLN: 15.0000 mL | Freq: Once | OROMUCOSAL | Status: AC

## 2021-04-22 MED ORDER — BUPIVACAINE HCL (PF) 0.5 % IJ SOLN
INTRAMUSCULAR | Status: AC
  Filled 2021-04-22: qty 30

## 2021-04-22 MED ORDER — SODIUM CHLORIDE 0.9 % IV SOLN: INTRAVENOUS | Status: DC

## 2021-04-22 MED ORDER — CEFAZOLIN SODIUM-DEXTROSE 2-4 GM/100ML-% IV SOLN
2.0000 g | INTRAVENOUS | Status: AC
  Administered 2021-04-22: 2 g via INTRAVENOUS

## 2021-04-22 MED ORDER — DROPERIDOL 2.5 MG/ML IJ SOLN: 0.6250 mg | Freq: Once | INTRAMUSCULAR | Status: DC | PRN

## 2021-04-22 MED ORDER — ORAL CARE MOUTH RINSE: 15.0000 mL | Freq: Once | OROMUCOSAL | Status: AC

## 2021-04-22 MED ORDER — CHLORHEXIDINE GLUCONATE 0.12 % MT SOLN
OROMUCOSAL | Status: AC
  Administered 2021-04-22: 15 mL via OROMUCOSAL
  Filled 2021-04-22: qty 15

## 2021-04-22 MED ORDER — FENTANYL CITRATE (PF) 100 MCG/2ML IJ SOLN: 25.0000 ug | INTRAMUSCULAR | Status: DC | PRN

## 2021-04-22 MED ORDER — HYDROCODONE-ACETAMINOPHEN 5-325 MG PO TABS: 1.0000 | ORAL_TABLET | Freq: Four times a day (QID) | ORAL | 0 refills | Status: AC | PRN

## 2021-04-22 MED ORDER — OXYCODONE HCL 5 MG/5ML PO SOLN: 5.0000 mg | Freq: Once | ORAL | Status: DC | PRN

## 2021-04-22 MED ORDER — ACETAMINOPHEN 10 MG/ML IV SOLN: 1000.0000 mg | Freq: Once | INTRAVENOUS | Status: DC | PRN

## 2021-04-22 MED ORDER — PROPOFOL 500 MG/50ML IV EMUL
INTRAVENOUS | Status: AC
  Filled 2021-04-22: qty 50

## 2021-04-22 MED ORDER — MIDAZOLAM HCL 2 MG/2ML IJ SOLN
INTRAMUSCULAR | Status: AC
  Filled 2021-04-22: qty 2

## 2021-04-22 MED ORDER — PROPOFOL 500 MG/50ML IV EMUL
INTRAVENOUS | Status: DC | PRN
  Administered 2021-04-22: 75 ug/kg/min via INTRAVENOUS

## 2021-04-22 MED ORDER — OXYCODONE HCL 5 MG PO TABS: 5.0000 mg | ORAL_TABLET | Freq: Once | ORAL | Status: DC | PRN

## 2021-04-22 MED ORDER — 0.9 % SODIUM CHLORIDE (POUR BTL) OPTIME
TOPICAL | Status: DC | PRN
  Administered 2021-04-22: 500 mL

## 2021-04-22 MED ORDER — RIVAROXABAN 20 MG PO TABS: 20.0000 mg | ORAL_TABLET | Freq: Every day | ORAL | 3 refills | Status: AC

## 2021-04-22 MED ORDER — CEFAZOLIN SODIUM-DEXTROSE 2-4 GM/100ML-% IV SOLN
INTRAVENOUS | Status: AC
  Filled 2021-04-22: qty 100

## 2021-04-22 MED ORDER — FENTANYL CITRATE (PF) 100 MCG/2ML IJ SOLN
INTRAMUSCULAR | Status: DC | PRN
  Administered 2021-04-22: 50 ug via INTRAVENOUS

## 2021-04-22 MED ORDER — PROPOFOL 10 MG/ML IV BOLUS
INTRAVENOUS | Status: DC | PRN
  Administered 2021-04-22: 20 mg via INTRAVENOUS
  Administered 2021-04-22: 10 mg via INTRAVENOUS

## 2021-04-22 MED ORDER — LACTATED RINGERS IV SOLN: INTRAVENOUS | Status: DC

## 2021-04-22 MED ORDER — ONDANSETRON HCL 4 MG/2ML IJ SOLN
INTRAMUSCULAR | Status: DC | PRN
  Administered 2021-04-22: 4 mg via INTRAVENOUS

## 2021-04-22 SURGICAL SUPPLY — 45 items
BLADE MED AGGRESSIVE (BLADE) ×1 IMPLANT
BLADE OSC/SAGITTAL MD 5.5X18 (BLADE) IMPLANT
BLADE SURG 15 STRL LF DISP TIS (BLADE) IMPLANT
BLADE SURG 15 STRL SS (BLADE)
BLADE SURG MINI STRL (BLADE) IMPLANT
BNDG CONFORM 2 STRL LF (GAUZE/BANDAGES/DRESSINGS) IMPLANT
BNDG ELASTIC 4X5.8 VLCR STR LF (GAUZE/BANDAGES/DRESSINGS) ×2 IMPLANT
BNDG ESMARK 4X12 TAN STRL LF (GAUZE/BANDAGES/DRESSINGS) ×2 IMPLANT
BNDG GAUZE ELAST 4 BULKY (GAUZE/BANDAGES/DRESSINGS) ×2 IMPLANT
CNTNR SPEC 2.5X3XGRAD LEK (MISCELLANEOUS) ×1
CONT SPEC 4OZ STER OR WHT (MISCELLANEOUS) ×1
CONT SPEC 4OZ STRL OR WHT (MISCELLANEOUS) ×1
CONTAINER SPEC 2.5X3XGRAD LEK (MISCELLANEOUS) ×1 IMPLANT
CUFF TOURN SGL QUICK 12 (TOURNIQUET CUFF) IMPLANT
CUFF TOURN SGL QUICK 18X4 (TOURNIQUET CUFF) IMPLANT
DRAPE FLUOR MINI C-ARM 54X84 (DRAPES) IMPLANT
DURAPREP 26ML APPLICATOR (WOUND CARE) ×2 IMPLANT
ELECT REM PT RETURN 9FT ADLT (ELECTROSURGICAL) ×2
ELECTRODE REM PT RTRN 9FT ADLT (ELECTROSURGICAL) ×1 IMPLANT
GAUZE SPONGE 4X4 12PLY STRL (GAUZE/BANDAGES/DRESSINGS) ×4 IMPLANT
GAUZE XEROFORM 1X8 LF (GAUZE/BANDAGES/DRESSINGS) ×2 IMPLANT
GLOVE BIO SURGEON STRL SZ7 (GLOVE) ×2 IMPLANT
GLOVE SURG UNDER LTX SZ7 (GLOVE) ×2 IMPLANT
GOWN STRL REUS W/ TWL LRG LVL3 (GOWN DISPOSABLE) ×2 IMPLANT
GOWN STRL REUS W/TWL LRG LVL3 (GOWN DISPOSABLE) ×4
HANDPIECE VERSAJET DEBRIDEMENT (MISCELLANEOUS) IMPLANT
KIT TURNOVER KIT A (KITS) ×2 IMPLANT
LABEL OR SOLS (LABEL) ×2 IMPLANT
MANIFOLD NEPTUNE II (INSTRUMENTS) ×2 IMPLANT
NDL FILTER BLUNT 18X1 1/2 (NEEDLE) ×1 IMPLANT
NDL HYPO 25X1 1.5 SAFETY (NEEDLE) ×2 IMPLANT
NEEDLE FILTER BLUNT 18X 1/2SAF (NEEDLE) ×1
NEEDLE FILTER BLUNT 18X1 1/2 (NEEDLE) ×1 IMPLANT
NEEDLE HYPO 25X1 1.5 SAFETY (NEEDLE) ×4 IMPLANT
NS IRRIG 500ML POUR BTL (IV SOLUTION) ×2 IMPLANT
PACK EXTREMITY ARMC (MISCELLANEOUS) ×2 IMPLANT
SOL PREP PVP 2OZ (MISCELLANEOUS) ×2
SOLUTION PREP PVP 2OZ (MISCELLANEOUS) ×1 IMPLANT
STOCKINETTE STRL 6IN 960660 (GAUZE/BANDAGES/DRESSINGS) ×2 IMPLANT
SUT ETHILON 3-0 FS-10 30 BLK (SUTURE) ×2
SUT VIC AB 3-0 SH 27 (SUTURE) ×2
SUT VIC AB 3-0 SH 27X BRD (SUTURE) ×1 IMPLANT
SUTURE EHLN 3-0 FS-10 30 BLK (SUTURE) ×2 IMPLANT
SWAB CULTURE AMIES ANAERIB BLU (MISCELLANEOUS) IMPLANT
SYR 10ML LL (SYRINGE) ×2 IMPLANT

## 2021-04-22 NOTE — Anesthesia Preprocedure Evaluation (Addendum)
Anesthesia Evaluation  ?Patient identified by MRN, date of birth, ID band ?Patient awake ? ? ? ?Reviewed: ?Allergy & Precautions, NPO status , Patient's Chart, lab work & pertinent test results, reviewed documented beta blocker date and time  ? ?History of Anesthesia Complications ?Negative for: history of anesthetic complications ? ?Airway ?Mallampati: III ? ?TM Distance: <3 FB ?Neck ROM: Full ? ? ? Dental ? ?(+) Upper Dentures, Lower Dentures ?  ?Pulmonary ?neg COPD, former smoker,  ?  ?breath sounds clear to auscultation- rhonchi ?(-) wheezing ? ? ? ? ? Cardiovascular ?hypertension, Pt. on medications and Pt. on home beta blockers ?+ Peripheral Vascular Disease  ?(-) CAD, (-) Past MI, (-) Cardiac Stents and (-) CABG  ?Rhythm:Regular Rate:Normal ?- Systolic murmurs and - Diastolic murmurs ? ?  ?Neuro/Psych ? Headaches, neg Seizures PSYCHIATRIC DISORDERS Anxiety  ? ?Macular degeneration. Receives ocular medication injections. Denies any gas bubble injections ?  ? GI/Hepatic ?GERD  Medicated,  ?Endo/Other  ?diabetes, Well Controlled, Insulin DependentHypothyroidism  ? Renal/GU ?Renal InsufficiencyRenal disease  ? ?  ?Musculoskeletal ? ?(+) Arthritis ,  ? Abdominal ?(+) - obese,   ?Peds ? Hematology ? ?(+) Blood dyscrasia, anemia ,   ?Anesthesia Other Findings ?Past Medical History: ?No date: Anemia ?No date: Anxiety ?No date: Arthritis ?    Comment:  Gout ?No date: Cataracts, both eyes ?No date: Diabetic retinopathy ([place]) ?    Comment:  NPDR OU ?No date: Diabetic retinopathy ([place]) ?No date: GERD (gastroesophageal reflux disease) ?No date: Gout ?No date: Headache ?    Comment:  h/o migraines ?No date: History of fracture of patella ?    Comment:  right knee ?No date: History of positive PPD ?    Comment:  Patient always shows positive ?No date: Hyperlipidemia ?No date: Hypertension ?No date: Hypertensive retinopathy ?    Comment:  OU ?No date: Hypothyroidism ?[id number]: Lichen  sclerosus ?    Comment:  of vulva ?No date: Metatarsal fracture ?No date: Neuropathy ?No date: Peripheral vascular disease ([place]) ?No date: Polyneuropathy ?    Comment:  numbness and tingling in feet and toes ?No date: Renal insufficiency ?    Comment:  Stage 3 ?No date: Sleep apnea ?    Comment:  does not use cpap-lost weight  ?No date: Type 2 diabetes mellitus, uncontrolled ([place]) ? ? Reproductive/Obstetrics ? ?  ? ? ? ? ? ? ? ? ? ? ? ? ? ?  ?  ? ? ? ? ? ? ? ?Anesthesia Physical ? ?Anesthesia Plan ? ?ASA: 3 ? ?Anesthesia Plan: General  ? ?Post-op Pain Management: Regional block*  ? ?Induction: Intravenous ? ?PONV Risk Score and Plan: 3 and TIVA ? ?Airway Management Planned: Natural Airway ? ?Additional Equipment: None ? ?Intra-op Plan:  ? ?Post-operative Plan:  ? ?Informed Consent: I have reviewed the patients History and Physical, chart, labs and discussed the procedure including the risks, benefits and alternatives for the proposed anesthesia with the patient or authorized representative who has indicated his/her understanding and acceptance.  ? ? ? ?Dental advisory given ? ?Plan Discussed with: CRNA and Anesthesiologist ? ?Anesthesia Plan Comments: (Discussed risks of anesthesia with patient, including PONV, sore throat, lip/dental/eye damage, post operative cognitive dysfunction. Rare risks discussed as well, such as cardiorespiratory and neurological sequelae, and allergic reactions. Discussed the role of CRNA in patient's perioperative care. Patient understands.)  ? ? ? ? ? ?Anesthesia Quick Evaluation ? ?

## 2021-04-22 NOTE — Discharge Instructions (Addendum)
AMBULATORY SURGERY  ?DISCHARGE INSTRUCTIONS ? ? ?The drugs that you were given will stay in your system until tomorrow so for the next 24 hours you should not: ? ?Drive an automobile ?Make any legal decisions ?Drink any alcoholic beverage ? ? ?You may resume regular meals tomorrow.  Today it is better to start with liquids and gradually work up to solid foods. ? ?You may eat anything you prefer, but it is better to start with liquids, then soup and crackers, and gradually work up to solid foods. ? ? ?Please notify your doctor immediately if you have any unusual bleeding, trouble breathing, redness and pain at the surgery site, drainage, fever, or pain not relieved by medication. ? ? ?Please contact your physician with any problems or Same Day Surgery at [phone], Monday through Friday 6 am to 4 pm, or [place] [facility] number at [phone].  ? ?[facility] ? ?POST OPERATIVE INSTRUCTIONS FOR DR. Ether Griffins AND DR. BAKER ?[facility] CLINIC PODIATRY DEPARTMENT ? ? ?Take your medication as prescribed.  Pain medication should be taken only as needed. ? ?Keep the dressing clean, dry and intact. ? ?Keep your foot elevated above the heart level for the first 48 hours. ? ?Walking to the bathroom and brief periods of walking are acceptable, unless we have instructed you to be non-weight bearing. ? ?Always wear your post-op shoe when walking.  Always use your crutches if you are to be non-weight bearing. ? ?Do not take a shower. Baths are permissible as long as the foot is kept out of the water.  ? ?Every hour you are awake:  ?Bend your knee 15 times. ?Flex foot 15 times ?Massage calf 15 times ? ?Call [facility] [phone]) if any of the following problems occur: ?You develop a temperature or fever. ?The bandage becomes saturated with blood. ?Medication does not stop your pain. ?Injury of the foot occurs. ?Any symptoms of infection including redness, odor, or red streaks running from  wound. ? ?

## 2021-04-22 NOTE — Anesthesia Procedure Notes (Signed)
Procedure Name: [place] ?Date/Time: 04/22/2021 11:05 AM ?Performed by: Jerrye Noble, CRNA ?Pre-anesthesia Checklist: Patient identified, Emergency Drugs available, Suction available and Patient being monitored ?Patient Re-evaluated:Patient Re-evaluated prior to induction ?Oxygen Delivery Method: Simple face mask ? ? ? ? ?

## 2021-04-22 NOTE — Anesthesia Postprocedure Evaluation (Addendum)
Anesthesia Post Note ? ?Patient: Cheryl Leonard ? ?Procedure(s) Performed: AMPUTATION TOE - 4TH METARSOPHANGEAL JOINT (Left: Toe) ? ?Patient location during evaluation: PACU ?Anesthesia Type: General ?Level of consciousness: awake and alert ?Pain management: pain level controlled ?Vital Signs Assessment: post-procedure vital signs reviewed and stable ?Respiratory status: spontaneous breathing, nonlabored ventilation and respiratory function stable ?Cardiovascular status: stable and blood pressure returned to baseline ?Postop Assessment: no apparent nausea or vomiting ?Anesthetic complications: no ? ? ?No notable events documented. ? ? ?Last Vitals:  ?Vitals:  ? 04/22/21 1215 04/22/21 1227  ?BP: (!) 114/53 (!) 115/48  ?Pulse: (!) 56 61  ?Resp: 19 18  ?Temp: (!) 36.2 ?C 36.6 ?C  ?SpO2: 100% 100%  ?  ?Last Pain:  ?Vitals:  ? 04/22/21 1227  ?TempSrc: Oral  ?PainSc:   ? ? ?  ?  ?  ?  ?  ?  ? ?Foye Deer ? ? ? ? ?

## 2021-04-22 NOTE — H&P (Signed)
HISTORY AND PHYSICAL INTERVAL NOTE: ? ?04/22/2021 ? ?10:53 AM ? 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  The various methods of treatment have been discussed with the patient.  No guarantees were given.  After consideration of risks, benefits and other options for treatment, the patient has consented to surgery.  I have reviewed the patients? chart and labs.   ? ?Procedure: Left fourth toe amputation ? ?A history and physical examination was performed in my office.  The patient was reexamined.  There have been no changes to this history and physical examination. ? ?Rosetta Posner, DPM ? ?

## 2021-04-22 NOTE — Op Note (Signed)
PODIATRY / FOOT AND ANKLE SURGERY OPERATIVE REPORT ? ? ? ?SURGEON: Caroline More, DPM ? ?PRE-OPERATIVE DIAGNOSIS:  ?1.  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  Diabetes type 2 polyneuropathy ?3.  History of PVD ? ?POST-OPERATIVE DIAGNOSIS: Same ? ?PROCEDURE(S): ?Left fourth toe amputation ? ?HEMOSTASIS: Left ankle tourniquet ? ?ANESTHESIA: MAC ? ?ESTIMATED BLOOD LOSS: 5 cc ? ?FINDING(S): ?1.  Left fourth toe PIPJ bone exposed with osteomyelitis consistent ? ?PATHOLOGY/SPECIMEN(S): Left fourth toe PIPJ bone culture, left fourth toe path specimen with proximal margin marked in purple ink ? ?INDICATIONS:   ?Cheryl Leonard is a 76 y.o. female who presents with a nonhealing ulceration to the left fourth toe lateral PIPJ due to rubbing and friction of the fourth and fifth toes together.  Patient has been treated previously with conservative measures of local wound care as well as offloading dressings and toe spacers.  Patient had a similar problem on the right side and had to have a right fourth toe amputation now presents with the same issue on the left side.  Patient had x-rays shown which did show subtle changes of osteomyelitis present to the area with obvious bone exposed through the wound at this time.  All treatment options were discussed with the patient both conservative and surgical attempts at correction include potential risks and complications at this time patient is elected for procedure consisting of left fourth toe amputation.  No guarantees given.  Consent obtained prior to procedure. ? ?DESCRIPTION: ?After obtaining full informed written consent, the patient was brought back to the operating room and placed supine upon the operating table.  The patient received IV antibiotics prior to induction.  After obtaining adequate anesthesia, the patient was prepped and draped in the standard fashion.  An Esmarch bandage was used to exsanguinate the left lower  extremity and the pneumatic ankle tourniquet was inflated.  A preoperative block was performed with 10 cc of half percent Marcaine plain about the left fourth ray. ? ?Attention was directed to the left fourth toe and metatarsal phalangeal joint area where a racquet type of incision was made around the base of the fourth toe with arms extending dorsal proximal and plantar proximal to the fourth metatarsal phalangeal joint.  The incision was made straight to bone.  At this time an extensor tenotomy and capsulotomy was performed followed by release the collateral and suspensory ligaments as well as any connection to the flexor tendon or plantar plate.  The fourth toe was disarticulated and passed off in the operative site.  A bone culture was taken from the fourth toe PIPJ wound and passed off the operative site.  The fourth toe was then passed off in the operative site with the proximal margin marked in purple ink.  The fourth metatarsal head appeared to have a normal consistency and more normal morphology, did not appear to have any evidence of osteomyelitis to the area.  The PIPJ that was passed off the operative site left fourth toe appeared to be soft and appeared to have signs consistent with osteomyelitis to the area.  The surgical site was flushed with copious amounts normal sterile saline.  The extensor and flexor tendons were cut as far proximally as possible through the incisional area.  The deep fascia/deep structures were reapproximated well coapted with 3-0 Vicryl.  The skin was then reapproximated well coapted with 3-0 nylon in a combination of simple and horizontal mattress type stitching.  An additional 10 cc  of half percent Marcaine plain was injected about the fourth ray and incisional area.  The pneumatic ankle tourniquet was deflated and a prompt hyperemic spots was noted all digits of the left foot.  A postoperative dressing was applied consisting of Xeroform followed by 4 x 4 gauze, gauze roll,  Ace wrap.  The patient tolerated the procedure and anesthesia well was transferred to recovery room vital signs stable vascular status intact all toes left foot.  Following a period of postoperative monitoring the patient be discharged home with the appropriate orders, instructions, and medications.  Patient is to maintain partial weightbearing with heel contact in a surgical shoe and to keep dressings clean, dry, and intact until next visit in 1 week. ? ?COMPLICATIONS: None ? ?CONDITION: Good, stable ? ?Caroline More, DPM ? ?

## 2021-04-22 NOTE — Transfer of Care (Signed)
Immediate Anesthesia Transfer of Care Note ? ?Patient: Cheryl Leonard ? ?Procedure(s) Performed: AMPUTATION TOE - 4TH METARSOPHANGEAL JOINT (Left: Toe) ? ?Patient Location: PACU ? ?Anesthesia Type:General ? ?Level of Consciousness: awake, alert  and oriented ? ?Airway & Oxygen Therapy: Patient Spontanous Breathing and Patient connected to face mask oxygen ? ?Post-op Assessment: Report given to RN, Post -op Vital signs reviewed and stable and Patient moving all extremities ? ?Post vital signs: Reviewed and stable ? ?Last Vitals:  ?Vitals Value Taken Time  ?BP 106/61 04/22/21 1149  ?Temp    ?Pulse 57 04/22/21 1151  ?Resp 16 04/22/21 1151  ?SpO2 100 % 04/22/21 1151  ?Vitals shown include unvalidated device data. ? ?Last Pain:  ?Vitals:  ? 04/22/21 0956  ?TempSrc: Oral  ?   ? ?  ? ?Complications: No notable events documented. ?

## 2021-04-23 ENCOUNTER — Encounter: Payer: Self-pay | Admitting: Podiatry

## 2021-04-26 LAB — SURGICAL PATHOLOGY

## 2021-04-27 LAB — ANAEROBIC CULTURE W GRAM STAIN: Gram Stain: NONE SEEN

## 2021-05-11 ENCOUNTER — Encounter (INDEPENDENT_AMBULATORY_CARE_PROVIDER_SITE_OTHER): Payer: Medicare Other

## 2021-05-11 ENCOUNTER — Ambulatory Visit (INDEPENDENT_AMBULATORY_CARE_PROVIDER_SITE_OTHER): Payer: Medicare Other | Admitting: Vascular Surgery

## 2021-05-14 NOTE — Progress Notes (Signed)
?[facility] Note ? ?05/17/2021 ? ?  ? ?CHIEF COMPLAINT ?Patient presents for Retina Follow Up ? ? ?HISTORY OF PRESENT ILLNESS: ?Cheryl Leonard is a 76 y.o. female who presents to the clinic today for:  ? ?HPI   ? ? Retina Follow Up   ?Patient presents with  Diabetic Retinopathy.  In both eyes.  Duration of 4 weeks.  Since onset it is stable.  I, the attending physician,  performed the HPI with the patient and updated documentation appropriately. ? ?  ?  ? ? Comments   ?4 week follow up BRVO OD- Doing well, no new problems. Vision seems the same. ?BS 131 this morning ?A1C 7 ? ?  ?  ?Last edited by Bernarda Caffey, MD on 05/17/2021  5:08 PM.  ?  ? ?Referring physician: ?Rusty Aus, MD ?[place] ?[facility] ?[place],   [address] ? ?HISTORICAL INFORMATION:  ? ?Selected notes from the [facility] ?Referred by Dr. Marvel Plan for concern of DME OD ?Lab Results  ?Component Value Date  ? HGBA1C 7.3 (H) 01/11/2021  ?  ? ?CURRENT MEDICATIONS: ?No current outpatient medications on file. (Ophthalmic Drugs)  ? ?No current facility-administered medications for this visit. (Ophthalmic Drugs)  ? ?Current Outpatient Medications (Other)  ?Medication Sig  ? ALPRAZolam (XANAX) 0.25 MG tablet Take 0.25 mg by mouth daily as needed for anxiety.  ? amLODipine (NORVASC) 5 MG tablet Take 5 mg by mouth 2 (two) times daily.  ? aspirin EC 81 MG tablet Take 1 tablet (81 mg total) by mouth daily.  ? cholecalciferol (VITAMIN D) 1000 units tablet Take 1,000 Units by mouth 2 (two) times daily.  ? CORAL CALCIUM PO Take 1 tablet by mouth 2 (two) times daily.   ? denosumab (PROLIA) 60 MG/ML SOLN injection Inject 60 mg into the skin every 6 (six) months.   ? escitalopram (LEXAPRO) 10 MG tablet Take 10 mg by mouth daily.  ? estradiol (ESTRACE) 0.1 MG/GM vaginal cream Place 1 Applicatorful vaginally daily as needed (vaginal irritation).  ? famotidine (PEPCID) 40 MG tablet  Take 40 mg by mouth daily.  ? furosemide (LASIX) 20 MG tablet Take 20 mg by mouth every Wednesday.  ? gabapentin (NEURONTIN) 300 MG capsule Take 300 mg by mouth at bedtime as needed (pain).  ? icosapent Ethyl (VASCEPA) 1 g capsule Take 2 g by mouth 2 (two) times daily.  ? levothyroxine (SYNTHROID, LEVOTHROID) 100 MCG tablet Take 100 mcg by mouth daily before breakfast.   ? lidocaine-prilocaine (EMLA) cream Apply 1 application topically as needed (apply prior to port a cath access).  ? Magnesium 500 MG TABS Take 500 mg by mouth every morning.   ? metFORMIN (GLUCOPHAGE) 1000 MG tablet Take 1,000 mg by mouth 2 (two) times daily with a meal.  ? metoprolol succinate (TOPROL-XL) 50 MG 24 hr tablet Take 50 mg by mouth daily. Take with or immediately following a meal.  ? mirtazapine (REMERON) 15 MG tablet Take 15 mg by mouth as needed.  ? Multiple Vitamin (MULTIVITAMIN WITH MINERALS) TABS tablet Take 1 tablet by mouth daily. Centrum Silver  ? nystatin cream (MYCOSTATIN) Apply 1 application topically daily as needed (Yeast infection).  ? olmesartan (BENICAR) 20 MG tablet Take 20 mg by mouth daily.  ? pantoprazole sodium (PROTONIX) 40 mg Take 40 mg by mouth every morning.   ? rivaroxaban (XARELTO) 20 MG TABS tablet Take 1 tablet (20 mg total) by  mouth daily with supper.  ? rosuvastatin (CRESTOR) 20 MG tablet Take 20 mg by mouth every morning.  ? TRESIBA FLEXTOUCH 200 UNIT/ML SOPN Inject 25-30 Units as directed at bedtime. Titrate according to fasting blood glucose not to exceed 50 units a day  ? vitamin B-12 (CYANOCOBALAMIN) 1000 MCG tablet Take 1,000 mcg by mouth daily.  ? vitamin C (ASCORBIC ACID) 250 MG tablet Take 250 mg by mouth daily.  ? vitamin E 400 UNIT capsule Take 400 Units by mouth daily.  ? zolpidem (AMBIEN) 10 MG tablet Take 10 mg by mouth at bedtime.  ? doxycycline (VIBRAMYCIN) 100 MG capsule Take 100 mg by mouth 2 (two) times daily. (Patient not taking: Reported on 05/17/2021)  ? ibuprofen (ADVIL) 600 MG tablet  Take 1 tablet (600 mg total) by mouth every 6 (six) hours as needed. (Patient not taking: Reported on 04/12/2021)  ? ?No current facility-administered medications for this visit. (Other)  ? ?REVIEW OF SYSTEMS: ?ROS   ?Positive for: Gastrointestinal, Musculoskeletal, Endocrine, Cardiovascular, Eyes, Respiratory ?Negative for: Constitutional, Neurological, Skin, Genitourinary, HENT, Psychiatric, Allergic/Imm, Heme/Lymph ?Last edited by Leonie Douglas, [place] on 05/17/2021  2:11 PM.  ?  ? ?ALLERGIES ?No Known Allergies ? ?PAST MEDICAL HISTORY ?Past Medical History:  ?Diagnosis Date  ? Anemia   ? Anxiety   ? Arthritis   ? Gout  ? Cataracts, both eyes   ? Diabetic retinopathy ([place])   ? NPDR OU  ? Diverticulitis   ? GERD (gastroesophageal reflux disease)   ? Gout   ? Headache   ? h/o migraines  ? History of fracture of patella   ? right knee  ? History of positive PPD   ? Patient always shows positive  ? Hyperlipidemia   ? Hypertension   ? Hypertensive retinopathy   ? OU  ? Hypothyroidism   ? Lichen sclerosus [id number]  ? of vulva  ? Metatarsal fracture   ? Neuropathy   ? Osteopenia   ? Peripheral vascular disease ([place])   ? Polyneuropathy   ? numbness and tingling in feet and toes  ? Renal insufficiency   ? Stage 3  ? Sleep apnea   ? does not use cpap-lost weight   ? Type 2 diabetes mellitus, uncontrolled   ? ?Past Surgical History:  ?Procedure Laterality Date  ? ABDOMINAL HYSTERECTOMY    ? AMPUTATION TOE Right 05/08/2020  ? Procedure: AMPUTATION TOE-Right 4th Toe;  Surgeon: Caroline More, DPM;  Location: [facility];  Service: Podiatry;  Laterality: Right;  ? AMPUTATION TOE Left 04/22/2021  ? Procedure: AMPUTATION TOE - 4TH METARSOPHANGEAL JOINT;  Surgeon: Caroline More, DPM;  Location: [facility];  Service: Podiatry;  Laterality: Left;  ? APPENDECTOMY    ? BREAST REDUCTION SURGERY  2001  ? CATARACT EXTRACTION    ? [place]  ? COLONOSCOPY  03/05/2013  ? Nml - due for repeat 03/06/2018  ? COLONOSCOPY WITH PROPOFOL N/A  03/18/2019  ? Procedure: COLONOSCOPY WITH PROPOFOL;  Surgeon: [place], Benay Pike, MD;  Location: [facility];  Service: Gastroenterology;  Laterality: N/A;  ? DIAGNOSTIC LAPAROSCOPY    ? [place] OF UTERUS  1989  ? ENDARTERECTOMY FEMORAL Bilateral 03/09/2018  ? Procedure: ENDARTERECTOMY FEMORAL;  Surgeon: Algernon Huxley, MD;  Location: [facility];  Service: Vascular;  Laterality: Bilateral;  ? ENDARTERECTOMY POPLITEAL Left 03/09/2018  ? Procedure: ENDARTERECTOMY POPLITEAL AND SFA;  Surgeon: Algernon Huxley, MD;  Location: [facility];  Service: Vascular;  Laterality: Left;  ?  ESOPHAGOGASTRODUODENOSCOPY  03/05/2013  ? ESOPHAGOGASTRODUODENOSCOPY (EGD) WITH PROPOFOL N/A 03/18/2019  ? Procedure: ESOPHAGOGASTRODUODENOSCOPY (EGD) WITH PROPOFOL;  Surgeon: [place], Benay Pike, MD;  Location: [facility];  Service: Gastroenterology;  Laterality: N/A;  ? EYE SURGERY    ? Eyelid Surgery  2012  ? INTRAMEDULLARY (IM) NAIL INTERTROCHANTERIC Left 10/30/2015  ? Procedure: INTRAMEDULLARY (IM) NAIL INTERTROCHANTRIC ;  Surgeon: Hessie Knows, MD;  Location: [facility];  Service: Orthopedics;  Laterality: Left;  ? KYPHOPLASTY N/A 10/25/2018  ? Procedure: L4 KYPHOPLASTY;  Surgeon: Hessie Knows, MD;  Location: [facility];  Service: Orthopedics;  Laterality: N/A;  ? LAPAROSCOPIC HYSTERECTOMY  2000  ? total  ? LOWER EXTREMITY ANGIOGRAPHY Left 03/08/2017  ? Procedure: LOWER EXTREMITY ANGIOGRAPHY;  Surgeon: Algernon Huxley, MD;  Location: [place] [facility];  Service: Cardiovascular;  Laterality: Left;  ? LOWER EXTREMITY ANGIOGRAPHY Left 10/30/2017  ? Procedure: LOWER EXTREMITY ANGIOGRAPHY;  Surgeon: Algernon Huxley, MD;  Location: [place] [facility];  Service: Cardiovascular;  Laterality: Left;  ? LOWER EXTREMITY ANGIOGRAPHY Right 03/08/2018  ? Procedure: LOWER EXTREMITY ANGIOGRAPHY;  Surgeon: Algernon Huxley, MD;  Location: [place] [facility];  Service: Cardiovascular;  Laterality: Right;  ? LOWER EXTREMITY ANGIOGRAPHY Left 10/01/2018  ? Procedure:  LOWER EXTREMITY ANGIOGRAPHY;  Surgeon: Algernon Huxley, MD;  Location: [place] [facility];  Service: Cardiovascular;  Laterality: Left;  ? LOWER EXTREMITY ANGIOGRAPHY Right 10/08/2018  ? Procedure: LOWER EXTREMIT

## 2021-05-17 ENCOUNTER — Encounter (INDEPENDENT_AMBULATORY_CARE_PROVIDER_SITE_OTHER): Payer: Self-pay | Admitting: Ophthalmology

## 2021-05-17 ENCOUNTER — Ambulatory Visit (INDEPENDENT_AMBULATORY_CARE_PROVIDER_SITE_OTHER): Payer: Medicare Other | Admitting: Ophthalmology

## 2021-05-17 DIAGNOSIS — H35371 Puckering of macula, right eye: Secondary | ICD-10-CM

## 2021-05-17 DIAGNOSIS — H35033 Hypertensive retinopathy, bilateral: Secondary | ICD-10-CM | POA: Diagnosis not present

## 2021-05-17 DIAGNOSIS — I10 Essential (primary) hypertension: Secondary | ICD-10-CM | POA: Diagnosis not present

## 2021-05-17 DIAGNOSIS — H34831 Tributary (branch) retinal vein occlusion, right eye, with macular edema: Secondary | ICD-10-CM | POA: Diagnosis not present

## 2021-05-17 DIAGNOSIS — Z961 Presence of intraocular lens: Secondary | ICD-10-CM

## 2021-05-17 DIAGNOSIS — E113213 Type 2 diabetes mellitus with mild nonproliferative diabetic retinopathy with macular edema, bilateral: Secondary | ICD-10-CM

## 2021-05-17 MED ORDER — AFLIBERCEPT 2MG/0.05ML IZ SOLN FOR KALEIDOSCOPE
2.0000 mg | INTRAVITREAL | Status: AC | PRN
  Administered 2021-05-17: 2 mg via INTRAVITREAL

## 2021-06-08 ENCOUNTER — Encounter: Payer: Self-pay | Admitting: Surgery

## 2021-06-08 ENCOUNTER — Ambulatory Visit (INDEPENDENT_AMBULATORY_CARE_PROVIDER_SITE_OTHER): Payer: Medicare Other | Admitting: Surgery

## 2021-06-08 VITALS — BP 126/65 | HR 79 | Temp 98.8°F | Wt 164.0 lb

## 2021-06-08 DIAGNOSIS — N321 Vesicointestinal fistula: Secondary | ICD-10-CM

## 2021-06-08 DIAGNOSIS — Z9049 Acquired absence of other specified parts of digestive tract: Secondary | ICD-10-CM | POA: Diagnosis not present

## 2021-06-08 NOTE — Progress Notes (Signed)
[facility] POST-OP OFFICE VISIT  06/08/2021  HPI: Cheryl Leonard is a 76 y.o. female 4 months plus s/p robotic sigmoid colectomy.    She denies of blood with the stools.  She is currently taking Metamucil, and averaging 1-2 bowel movements daily.  Had a rare incident or 2 of loose stool with rectal urgency and soilage.  But this is a rarity.  Otherwise she has had no recurrent symptoms of urinary tract infection.  And denies any abdominal pain.  Vital signs: BP 126/65   Pulse 79   Temp 98.8 F (37.1 C) (Oral)   Wt 164 lb (74.4 kg)   SpO2 96%   BMI 27.29 kg/m    Physical Exam: Constitutional: She appears well, jovial. Abdomen: Incisions of all healed nicely without any evidence of mass, or hernia present.  Abdomen is soft and unremarkable. Skin: Soft dry nontender.  Assessment/Plan: This is a 76 y.o. female 6 weeks s/p robotic sigmoid colectomy with natural orifice extraction.  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   Rectal bleeding 08/11/2020   Colitis 08/11/2020   AKI (acute kidney injury) ([place]) 08/11/2020   Rectal bleed 08/11/2020   Acute kidney failure, unspecified ([place]) 08/11/2020   Amputated toe of right foot ([place]) 05/13/2020   Gangrene of toe of right foot ([place]) 05/04/2020   Major depressive disorder, recurrent, mild ([place]) 11/06/2019   Benign hypertensive kidney disease with chronic kidney disease 10/30/2019   Hyperkalemia 10/30/2019   Proteinuria 10/30/2019   History of 2019 novel coronavirus disease (COVID-19) 02/20/2019   Anemia of chronic kidney failure, stage 3 (moderate) ([place]) 02/05/2019   Normocytic anemia 02/01/2019   Osteoporosis 11/15/2018   Atherosclerosis of native arteries of extremity with rest pain ([place]) 09/25/2018   Osteoporosis, 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   Wound disruption,  post-op, skin, subsequent encounter 04/09/2018   Postoperative wound infection 03/21/2018   Atherosclerosis of artery of extremity with intermittent claudication ([place]) 03/08/2018   Atherosclerosis of native arteries of the extremities with ulceration ([place]) 10/13/2017   DM type 2 with diabetic peripheral neuropathy ([place]) 10/12/2017   History of hip fracture 10/12/2017   Type 2 diabetes mellitus with vascular disease ([place]) 10/12/2017   Carotid atherosclerosis, bilateral 07/11/2017   Medicare annual wellness visit, initial 05/03/2017   Peripheral vascular disease, unspecified ([place]) 03/01/2017   Diabetic ulcer of toe of left foot associated with type 2 diabetes mellitus ([employer]) 03/01/2017   Chronic painful diabetic neuropathy ([place]) 06/02/2016   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   Hip fracture ([place]) 10/30/2015   Greater trochanter fracture ([place]) 10/29/2015   Acquired hypothyroidism 10/16/2015   Stage 3b chronic kidney disease ([place]) 10/16/2015   Hyperlipidemia, mixed 04/16/2015   Benign essential hypertension 04/16/2015   Closed compression fracture of lumbar vertebra ([place]) 04/16/2015   Iron deficiency anemia 09/22/2014   Disease of thyroid gland 09/22/2014   Diabetes mellitus type 2, uncontrolled [id number]   Lichen sclerosus [id number]   History of positive PPD 10/08/2013    -Reviewed potential options of considering Kegel exercises which may be helpful.  But otherwise she is quite content with her current status and will be glad to see her again as needed.   Ronny Bacon M.D., FACS 06/08/2021, 8:55 AM

## 2021-06-08 NOTE — Patient Instructions (Signed)
Continue taking Fiber.   If you have any concerns or questions, please feel free to call our office. Follow up as needed.   Minimally Invasive Total Colectomy, Care After This sheet gives you information about how to care for yourself after your procedure. Your health care provider may also give you more specific instructions. If you have problems or questions, contact your health care provider. What can I expect after the procedure? After your procedure, it is common to have the following: Pain in your abdomen, especially in the incision areas. You will be given medicine to control the pain. Tiredness. This is a normal part of the recovery process. Your energy level will return to normal over the next several weeks. Changes in your bowel movements, especially having bowel movements more often. Talk with your health care provider about how to manage this. Follow these instructions at home: Medicines Take over-the-counter and prescription medicines only as told by your health care provider. If you were prescribed an antibiotic medicine, take it as told by your health care provider. Do not stop using the antibiotic even if you start to feel better. Ask your health care provider if the medicine prescribed to you requires you to avoid driving or using machinery. Eating and drinking Follow instructions from your health care provider about what you can eat after surgery. You may be asked to begin with a diet that is low in fiber. Do not drink alcohol if: Your health care provider tells you not to drink. You are pregnant, may be pregnant, or are planning to become pregnant. If you drink alcohol: Limit how much you use to: 0-1 drink a day for women. 0-2 drinks a day for men. Be aware of how much alcohol is in your drink. In the [place] one drink equals one 12 oz bottle of beer (355 mL), one 5 oz glass of wine (148 mL), or one 1 oz glass of hard liquor (44 mL). Incision care  Follow instructions  from your health care provider about how to take care of your incisions. Make sure you: Wash your hands with soap and water for at least 20 seconds before and after applying medicine to the area, and before and after changing your bandage (dressing). If soap and water are not available, use hand sanitizer. Change your dressing as told by your health care provider. Leave stitches (sutures) or staples in place. These skin closures may need to stay in place for 2 weeks or longer. If adhesive strip edges start to loosen and curl up, you may trim the loose edges. Do not remove adhesive strips completely unless your health care provider tells you to do that. Keep your incisions clean and dry. Do not wear tight clothing over the incisions. Tight clothing may rub and irritate the incision areas, which may cause the incisions to open. Do not take baths, swim, or use a hot tub until your health care provider approves. Ask your health care provider if you may take showers. You may only be allowed to take sponge baths. Check your incision area every day for signs of infection. Check for: More redness, swelling, or pain. Fluid or blood. Warmth. Pus or a bad smell. Activity Rest as told by your health care provider. Avoid sitting for a long time without moving. Get up to take short walks every 1-2 hours. This is important to improve blood flow and breathing. Ask for help if you feel weak or unsteady. Do not lift anything that is heavier than  10 lb (4.5 kg), or the limit that you are told, until your health care provider says that it is safe. Return to your normal activities as told by your health care provider. Ask your health care provider what activities are safe for you. General instructions Keep all follow-up visits as told by your health care provider. This is important. You may need a follow-up visit to remove sutures or staples. Contact a health care provider if: You have more redness, swelling, or pain  around your incisions. You have fluid or blood coming from the incisions. Your incisions feel warm to the touch. You have pus or a bad smell coming from your incisions or your dressing. You have a fever. Your incisions break open after sutures or staples have been removed. Get help right away if: You develop a rash. You have chest pain or difficulty breathing. You have pain or swelling in your legs. You feel light-headed or you faint. Your abdomen swells (becomes distended). You have nausea or vomiting. You have blood in your stool. Summary After the procedure, it is common to have pain in the abdomen, tiredness, or changes in bowel movements. Follow instructions from your health care provider about how to care for your incisions. Begin your diet with low-fiber foods. Your health care provider will tell you when to resume your regular diet. Contact a health care provider if you have any signs of infection, such as a fever, more redness, swelling, or pain around your incisions, or a bad smell coming from your incisions. Get help right away if you have a rash, nausea, chest pain, pain or swelling in your legs, or blood in your stool. This information is not intended to replace advice given to you by your health care provider. Make sure you discuss any questions you have with your health care provider. Document Revised: 12/11/2020 Document Reviewed: 11/28/2018 Elsevier Patient Education  2023 [employer].

## 2021-06-14 ENCOUNTER — Ambulatory Visit (INDEPENDENT_AMBULATORY_CARE_PROVIDER_SITE_OTHER): Payer: Medicare Other | Admitting: Ophthalmology

## 2021-06-14 ENCOUNTER — Encounter (INDEPENDENT_AMBULATORY_CARE_PROVIDER_SITE_OTHER): Payer: Self-pay | Admitting: Ophthalmology

## 2021-06-14 DIAGNOSIS — Z961 Presence of intraocular lens: Secondary | ICD-10-CM

## 2021-06-14 DIAGNOSIS — H35033 Hypertensive retinopathy, bilateral: Secondary | ICD-10-CM | POA: Diagnosis not present

## 2021-06-14 DIAGNOSIS — H35371 Puckering of macula, right eye: Secondary | ICD-10-CM

## 2021-06-14 DIAGNOSIS — H34831 Tributary (branch) retinal vein occlusion, right eye, with macular edema: Secondary | ICD-10-CM | POA: Diagnosis not present

## 2021-06-14 DIAGNOSIS — I10 Essential (primary) hypertension: Secondary | ICD-10-CM | POA: Diagnosis not present

## 2021-06-14 DIAGNOSIS — E113213 Type 2 diabetes mellitus with mild nonproliferative diabetic retinopathy with macular edema, bilateral: Secondary | ICD-10-CM | POA: Diagnosis not present

## 2021-06-14 NOTE — Progress Notes (Signed)
[facility] Note  06/14/2021     CHIEF COMPLAINT Patient presents for Retina Follow Up   HISTORY OF PRESENT ILLNESS: Cheryl Leonard is a 76 y.o. female who presents to the clinic today for:   HPI     Retina Follow Up   Patient presents with  CRVO/BRVO.  In right eye.  Severity is moderate.  Duration of 4 weeks.  Since onset it is stable.  I, the attending physician,  performed the HPI with the patient and updated documentation appropriately.        Comments   Pt here for 4 wk ret f/u BRVO OD. Pt states VA is the same, no changes she can tell.       Last edited by Bernarda Caffey, MD on 06/15/2021  1:01 PM.      Referring physician: Rusty Aus, [place],  [place] [address]  HISTORICAL INFORMATION:   Selected notes from the MEDICAL RECORD NUMBER Referred by Dr. Marvel Plan for concern of DME OD Lab Results  Component Value Date   HGBA1C 7.3 (H) 01/11/2021     CURRENT MEDICATIONS: No current outpatient medications on file. (Ophthalmic Drugs)   No current facility-administered medications for this visit. (Ophthalmic Drugs)   Current Outpatient Medications (Other)  Medication Sig   ALPRAZolam (XANAX) 0.25 MG tablet Take 0.25 mg by mouth daily as needed for anxiety.   amLODipine (NORVASC) 5 MG tablet Take 5 mg by mouth 2 (two) times daily.   aspirin EC 81 MG tablet Take 1 tablet (81 mg total) by mouth daily.   cholecalciferol (VITAMIN D) 1000 units tablet Take 1,000 Units by mouth 2 (two) times daily.   CORAL CALCIUM PO Take 1 tablet by mouth 2 (two) times daily.    denosumab (PROLIA) 60 MG/ML SOLN injection Inject 60 mg into the skin every 6 (six) months.    escitalopram (LEXAPRO) 10 MG tablet Take 10 mg by mouth daily.   estradiol (ESTRACE) 0.1 MG/GM vaginal cream Place 1 Applicatorful vaginally daily as needed (vaginal irritation).   famotidine (PEPCID) 40 MG tablet Take 40  mg by mouth daily.   furosemide (LASIX) 20 MG tablet Take 20 mg by mouth every Wednesday.   gabapentin (NEURONTIN) 300 MG capsule Take 300 mg by mouth at bedtime as needed (pain).   icosapent Ethyl (VASCEPA) 1 g capsule Take 2 g by mouth 2 (two) times daily.   levothyroxine (SYNTHROID, LEVOTHROID) 100 MCG tablet Take 100 mcg by mouth daily before breakfast.    lidocaine-prilocaine (EMLA) cream Apply 1 application topically as needed (apply prior to port a cath access).   Magnesium 500 MG TABS Take 500 mg by mouth every morning.    metFORMIN (GLUCOPHAGE) 1000 MG tablet Take 1,000 mg by mouth 2 (two) times daily with a meal.   metoprolol succinate (TOPROL-XL) 50 MG 24 hr tablet Take 50 mg by mouth daily. Take with or immediately following a meal.   mirtazapine (REMERON) 15 MG tablet Take 15 mg by mouth as needed.   Multiple Vitamin (MULTIVITAMIN WITH MINERALS) TABS tablet Take 1 tablet by mouth daily. Centrum Silver   nystatin cream (MYCOSTATIN) Apply 1 application topically daily as needed (Yeast infection).   olmesartan (BENICAR) 20 MG tablet Take 20 mg by mouth daily.   pantoprazole sodium (PROTONIX) 40 mg Take 40 mg by mouth every morning.    rivaroxaban (XARELTO) 20 MG TABS tablet Take 1 tablet (  20 mg total) by mouth daily with supper.   rosuvastatin (CRESTOR) 20 MG tablet Take 20 mg by mouth every morning.   TRESIBA FLEXTOUCH 200 UNIT/ML SOPN Inject 25-30 Units as directed at bedtime. Titrate according to fasting blood glucose not to exceed 50 units a day   vitamin B-12 (CYANOCOBALAMIN) 1000 MCG tablet Take 1,000 mcg by mouth daily.   vitamin C (ASCORBIC ACID) 250 MG tablet Take 250 mg by mouth daily.   vitamin E 400 UNIT capsule Take 400 Units by mouth daily.   zolpidem (AMBIEN) 10 MG tablet Take 10 mg by mouth at bedtime.   No current facility-administered medications for this visit. (Other)   REVIEW OF SYSTEMS: ROS   Positive for: Gastrointestinal, Musculoskeletal, Endocrine,  Cardiovascular, Eyes, Respiratory Negative for: Constitutional, Neurological, Skin, Genitourinary, HENT, Psychiatric, Allergic/Imm, Heme/Lymph Last edited by Kingsley Spittle, COT on 06/14/2021  2:56 PM.     ALLERGIES No Known Allergies  PAST MEDICAL HISTORY Past Medical History:  Diagnosis Date   Anemia    Anxiety    Arthritis    Gout   Cataracts, both eyes    Diabetic retinopathy ([place])    NPDR OU   Diverticulitis    GERD (gastroesophageal reflux disease)    Gout    Headache    h/o migraines   History of fracture of patella    right knee   History of positive PPD    Patient always shows positive   Hyperlipidemia    Hypertension    Hypertensive retinopathy    OU   Hypothyroidism    Lichen sclerosus 44/96/7591   of vulva   Metatarsal fracture    Neuropathy    Osteopenia    Peripheral vascular disease (HCC)    Polyneuropathy    numbness and tingling in feet and toes   Renal insufficiency    Stage 3   Sleep apnea    does not use cpap-lost weight    Type 2 diabetes mellitus, uncontrolled    Past Surgical History:  Procedure Laterality Date   ABDOMINAL HYSTERECTOMY     AMPUTATION TOE Right 05/08/2020   Procedure: AMPUTATION TOE-Right 4th Toe;  Surgeon: Caroline More, DPM;  Location: [facility];  Service: Podiatry;  Laterality: Right;   AMPUTATION TOE Left 04/22/2021   Procedure: AMPUTATION TOE - 4TH METARSOPHANGEAL JOINT;  Surgeon: Caroline More, DPM;  Location: [facility];  Service: Podiatry;  Laterality: Left;   APPENDECTOMY     BREAST REDUCTION SURGERY  2001   CATARACT EXTRACTION     CESAREAN SECTION  1976   COLONOSCOPY  03/05/2013   Nml - due for repeat 03/06/2018   COLONOSCOPY WITH PROPOFOL N/A 03/18/2019   Procedure: COLONOSCOPY WITH PROPOFOL;  Surgeon: Toledo, Benay Pike, MD;  Location: [facility];  Service: Gastroenterology;  Laterality: N/A;   DIAGNOSTIC LAPAROSCOPY     DILATION AND CURETTAGE OF UTERUS  1989   ENDARTERECTOMY FEMORAL Bilateral 03/09/2018    Procedure: ENDARTERECTOMY FEMORAL;  Surgeon: Algernon Huxley, MD;  Location: [facility];  Service: Vascular;  Laterality: Bilateral;   ENDARTERECTOMY POPLITEAL Left 03/09/2018   Procedure: ENDARTERECTOMY POPLITEAL AND SFA;  Surgeon: Algernon Huxley, MD;  Location: [facility];  Service: Vascular;  Laterality: Left;   ESOPHAGOGASTRODUODENOSCOPY  03/05/2013   ESOPHAGOGASTRODUODENOSCOPY (EGD) WITH PROPOFOL N/A 03/18/2019   Procedure: ESOPHAGOGASTRODUODENOSCOPY (EGD) WITH PROPOFOL;  Surgeon: Toledo, Benay Pike, MD;  Location: [facility];  Service: Gastroenterology;  Laterality: N/A;   EYE SURGERY     Eyelid Surgery  2012   INTRAMEDULLARY (IM) NAIL INTERTROCHANTERIC Left 10/30/2015   Procedure: INTRAMEDULLARY (IM) NAIL INTERTROCHANTRIC ;  Surgeon: Hessie Knows, MD;  Location: [facility];  Service: Orthopedics;  Laterality: Left;   KYPHOPLASTY N/A 10/25/2018   Procedure: L4 KYPHOPLASTY;  Surgeon: Hessie Knows, MD;  Location: [facility];  Service: Orthopedics;  Laterality: N/A;   LAPAROSCOPIC HYSTERECTOMY  2000   total   LOWER EXTREMITY ANGIOGRAPHY Left 03/08/2017   Procedure: LOWER EXTREMITY ANGIOGRAPHY;  Surgeon: Algernon Huxley, MD;  Location: [place] [facility];  Service: Cardiovascular;  Laterality: Left;   LOWER EXTREMITY ANGIOGRAPHY Left 10/30/2017   Procedure: LOWER EXTREMITY ANGIOGRAPHY;  Surgeon: Algernon Huxley, MD;  Location: [place] [facility];  Service: Cardiovascular;  Laterality: Left;   LOWER EXTREMITY ANGIOGRAPHY Right 03/08/2018   Procedure: LOWER EXTREMITY ANGIOGRAPHY;  Surgeon: Algernon Huxley, MD;  Location: [place] [facility];  Service: Cardiovascular;  Laterality: Right;   LOWER EXTREMITY ANGIOGRAPHY Left 10/01/2018   Procedure: LOWER EXTREMITY ANGIOGRAPHY;  Surgeon: Algernon Huxley, MD;  Location: [place] [facility];  Service: Cardiovascular;  Laterality: Left;   LOWER EXTREMITY ANGIOGRAPHY Right 10/08/2018   Procedure: LOWER EXTREMITY ANGIOGRAPHY;  Surgeon: Algernon Huxley, MD;  Location: [place]  [facility];  Service: Cardiovascular;  Laterality: Right;   LOWER EXTREMITY ANGIOGRAPHY Right 05/07/2020   Procedure: Lower Extremity Angiography;  Surgeon: Algernon Huxley, MD;  Location: [place] [facility];  Service: Cardiovascular;  Laterality: Right;   LYSIS OF ADHESION  01/25/2021   Procedure: LYSIS OF ADHESION;  Surgeon: Ronny Bacon, MD;  Location: [facility];  Service: General;;   PERIPHERAL VASCULAR INTERVENTION  03/08/2018   Procedure: PERIPHERAL VASCULAR INTERVENTION;  Surgeon: Algernon Huxley, MD;  Location: [place] [facility];  Service: Cardiovascular;;   PORTA CATH INSERTION N/A 02/17/2020   Procedure: PORTA CATH INSERTION;  Surgeon: Algernon Huxley, MD;  Location: [place] [facility];  Service: Cardiovascular;  Laterality: N/A;   REDUCTION MAMMAPLASTY  1997   SACROPLASTY N/A 10/25/2018   Procedure: S1 SACROPLASTY;  Surgeon: Hessie Knows, MD;  Location: [facility];  Service: Orthopedics;  Laterality: N/A;   FAMILY HISTORY Family History  Problem Relation Age of Onset   Coronary artery disease Father    Heart attack Father    Coronary artery disease Mother    Heart attack Mother    Ovarian cancer Sister 49       sister had hormonal therapy for IVF txs-which increased risk factor for ovarian cancer   Breast cancer Neg Hx    SOCIAL HISTORY Social History   Tobacco Use   Smoking status: Former    Packs/day: 1.00    Years: 20.00    Total pack years: 20.00    Types: Cigarettes    Quit date: 03/07/1996    Years since quitting: 25.2   Smokeless tobacco: Never   Tobacco comments:    started smoking at age 46 but stopped smoking in 2000  Vaping Use   Vaping Use: Never used  Substance Use Topics   Alcohol use: No    Alcohol/week: 0.0 standard drinks of alcohol   Drug use: No       OPHTHALMIC EXAM: Base Eye Exam     Visual Acuity (Snellen - Linear)       Right Left   Dist [place] 20/30 20/30   Dist ph [place] NI 20/25         Tonometry (Tonopen, 3:01 PM)  Right Left    Pressure 18 20         Pupils       Dark Light Shape React APD   Right 3 2 Round Brisk None   Left 3 2 Round Brisk None         Visual Fields (Counting fingers)       Left Right    Full Full         Extraocular Movement       Right Left    Full, Ortho Full, Ortho         Neuro/Psych     Oriented x3: Yes   Mood/Affect: Normal         Dilation     Both eyes: 1.0% Mydriacyl, 2.5% Phenylephrine @ 3:02 PM           Slit Lamp and Fundus Exam     External Exam       Right Left   External Normal Normal         Slit Lamp Exam       Right Left   Lids/Lashes dermatochalasis dermatochalasis   Conjunctiva/Sclera White and quiet White and quiet   Cornea arcus; well healed cataract wound; 2-3+ diffuse Punctate epithelial erosions, decreased TBUT, mild Anterior basement membrane dystrophy superiorly arcus; well healed cataract wound, 2-3+ diffuse Punctate epithelial erosions, irregualr epi surface, decreased TBUT   Anterior Chamber Deep and quiet Deep and quiet   Iris Round and dilated Round and dilated   Lens PCIOL; open PC PCIOL; open PC   Anterior Vitreous syneresis, Posterior vitreous detachment, vitreous condensations inferiorly syneresis, Posterior vitreous detachment         Fundus Exam       Right Left   Disc Superior hyperemia, mild Pallor Pink and Sharp   C/D Ratio 0.6 0.5   Macula Flat, Blunted foveal reflex, trace cystic changes - slightly improved, +Epiretinal membrane, +MA flat; good foveal reflex, no heme or edema, small pigment clump IT to fovea   Vessels attenuated, Tortuous attenuated, Tortuous   Periphery Attached; 3 focal DBH temporal periphery Attached, no heme           IMAGING AND PROCEDURES  Imaging and Procedures for 04/25/17  OCT, Retina - OU - Both Eyes       Right Eye Quality was good. Central Foveal Thickness: 357. Progression has improved. Findings include no SRF, abnormal foveal contour, epiretinal membrane,  intraretinal fluid (Mild interval improvement in IRF/cystic changes).   Left Eye Quality was good. Central Foveal Thickness: 289. Progression has been stable. Findings include normal foveal contour, no IRF, no SRF (Trace ERM).   Notes *Images captured and stored on drive  Diagnosis / Impression:  OD: BRVO w/ CME - Mild interval improvement in IRF/cystic changes OS: NFP; no IRF/SRF--stable, trace ERM  Clinical management:  See below  Abbreviations: NFP - Normal foveal profile. CME - cystoid macular edema. PED - pigment epithelial detachment. IRF - intraretinal fluid. SRF - subretinal fluid. EZ - ellipsoid zone. ERM - epiretinal membrane. ORA - outer retinal atrophy. ORT - outer retinal tubulation. SRHM - subretinal hyper-reflective material      Intravitreal Injection, Pharmacologic Agent - OD - Right Eye       Time Out 06/14/2021. 4:00 PM. Confirmed correct patient, procedure, site, and patient consented.   Anesthesia Topical anesthesia was used. Anesthetic medications included Lidocaine 2%, Proparacaine 0.5%.   Procedure Preparation included 5% betadine to ocular surface, eyelid speculum. A (32  g) needle was used.   Injection: 2 mg aflibercept 2 MG/0.05ML   Route: Intravitreal, Site: Right Eye   NDC: A3590391, Lot: [id number], Expiration date: 04/03/2022, Waste: 0 mL   Post-op Post injection exam found visual acuity of at least counting fingers. The patient tolerated the procedure well. There were no complications. The patient received written and verbal post procedure care education. Post injection medications were not given.            ASSESSMENT/PLAN:    ICD-10-CM   1. 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 Both eyes affected by mild nonproliferative diabetic retinopathy with macular edema,  associated with type 2 diabetes mellitus (HCC)  U98.1191 OCT, Retina - OU - Both Eyes    3. Essential hypertension  I10     4. Hypertensive retinopathy of both eyes  H35.033     5. Epiretinal membrane (ERM) of right eye  H35.371     6. Pseudophakia of both eyes  Z96.1      1. BRVO w/ CME OD  - by history, pt states symptoms first noticed 2 wks prior to presentation, but reports changes may have occurred prior  - initial exam with differential tortuosity of vessels (OD > OS)  - FA (02.10.20) shows mild late staining / leakage in macula, staining / leakage of disc -- improving CME  - differential includes DM2 (DME), hypertensive retinopathy, inflammatory etiology / uveitis  - S/P IVA OD #1 (02.08.19), #2 (03.11.19), #3 (04.09.19), #4 (05.20.19), #5 (02.10.20)  - gave IVA OD on 2.10.20 due to pending Eylea4U for 2020 -- resulted in increased IRF/CME  - review of OCTs show persistent IRF and cystic changes --  resistance to IVA   - June 2019 -- switched therapies: S/P IVE OD #1 (06.24.19), #2 (07.24.19), #3 (09.04.19), #4 (10.30.19),#5 (12.30.19), #6 (03.23.20), #7 (05.05.20), #8 (07.16.20), #9 (07.17.20), #10 (08.28.20), #11 (10.13.20), # 12 (11.17.20), #13 (2.8.21), #14 (03.09.21), #15 (04.13.21), #16 (05.11.21), #17 (06.17.21), #18 (07.23.21), #19 (08.30.21), #20 (10.04.21), #21 (11.08.21), #22 (12.08.21), #23 (01.31.22), #24 (02.28.22), #25 (04.01.22), #26 (06.15.22), #27 (07.13.22), #28 (08.17.22), #29 (09.21.22), #30 (10.19.22), #31 (11.16.22), #32 (12.16.22), #33 (01.13.23), #34 (02.10.23), #35 (03.15.23), #36 (04.14.23), #37 (05.15.23)  - OCT today shows mild improvement in IRF/cystic changes at 4+ weeks  - BCVA 20/30 -- stable  - Eylea4U benefits investigation completed and pt approved for IVE for 2022  - recommend IVE OD #38 today, 06.12.23 w/ f/u in 4 wks  - RBA of procedure discussed, questions answered  - informed consent obtained  - see procedure note  - Eylea informed consent  form obtained, re-signed and scanned on 05.15.23  - f/u 4 weeks  -- DFE/OCT/possible injection  2. Mild nonproliferative diabetic retinopathy, both eyes  - A1c 6.0 on 10.14.22  - could be contributing to CME OD  - OS with minimal diabetic retinopathy  - continue to monitor  3,4. Hypertensive retinopathy OU - stable  - as above, may have contributing to CME OD  - discussed importance of tight BP control  - monitor  5. Epiretinal membrane, right eye   - stable nasal ERM  - no indication for surgery at this time  6. Pseudophakia OU  - s/p CE/IOL OU by cataract surgeon in [facility]  - doing well  -  monitor  Ophthalmic Meds Ordered this visit:  Meds ordered this encounter  Medications   aflibercept (EYLEA) SOLN 2 mg     Return in about 4 weeks (around 07/12/2021) for DFE, OCT, possible injection.  This document serves as a record of services personally performed by Gardiner Sleeper, MD, PhD. It was created on their behalf by Orvan Falconer, an ophthalmic technician. The creation of this record is the provider's dictation and/or activities during the visit.    Electronically signed by: Orvan Falconer, OA, 06/15/21  1:03 PM  This document serves as a record of services personally performed by Gardiner Sleeper, MD, PhD. It was created on their behalf by Leonie Douglas, an ophthalmic technician. The creation of this record is the provider's dictation and/or activities during the visit.    Electronically signed by: Leonie Douglas COA, 06/15/21  1:03 PM  Gardiner Sleeper, M.D., Ph.D. Diseases & Surgery of the Retina and Vitreous [facility]  I have reviewed the above documentation for accuracy and completeness, and I agree with the above. Gardiner Sleeper, M.D., Ph.D. 06/15/21 1:04 PM  Abbreviations: M myopia (nearsighted); A astigmatism; H hyperopia (farsighted); P presbyopia; Mrx spectacle prescription;  CTL contact lenses; OD right eye; OS left eye; OU both eyes  XT  exotropia; ET esotropia; PEK punctate epithelial keratitis; PEE punctate epithelial erosions; DES dry eye syndrome; MGD meibomian gland dysfunction; ATs artificial tears; PFAT's preservative free artificial tears; [place] nuclear sclerotic cataract; PSC posterior subcapsular cataract; ERM epi-retinal membrane; PVD posterior vitreous detachment; RD retinal detachment; DM diabetes mellitus; DR diabetic retinopathy; NPDR non-proliferative diabetic retinopathy; PDR proliferative diabetic retinopathy; CSME clinically significant macular edema; DME diabetic macular edema; dbh dot blot hemorrhages; CWS cotton wool spot; POAG primary open angle glaucoma; C/D cup-to-disc ratio; HVF humphrey visual field; GVF goldmann visual field; OCT optical coherence tomography; IOP intraocular pressure; BRVO Branch retinal vein occlusion; CRVO central retinal vein occlusion; CRAO central retinal artery occlusion; BRAO branch retinal artery occlusion; RT retinal tear; SB scleral buckle; PPV pars plana vitrectomy; VH Vitreous hemorrhage; PRP panretinal laser photocoagulation; IVK intravitreal kenalog; VMT vitreomacular traction; MH Macular hole;  NVD neovascularization of the disc; NVE neovascularization elsewhere; AREDS age related eye disease study; ARMD age related macular degeneration; POAG primary open angle glaucoma; EBMD epithelial/anterior basement membrane dystrophy; ACIOL anterior chamber intraocular lens; IOL intraocular lens; PCIOL posterior chamber intraocular lens; Phaco/IOL phacoemulsification with intraocular lens placement; [place] photorefractive keratectomy; LASIK laser assisted in situ keratomileusis; HTN hypertension; DM diabetes mellitus; COPD chronic obstructive pulmonary disease

## 2021-06-15 ENCOUNTER — Encounter (INDEPENDENT_AMBULATORY_CARE_PROVIDER_SITE_OTHER): Payer: Self-pay | Admitting: Ophthalmology

## 2021-06-15 MED ORDER — AFLIBERCEPT 2MG/0.05ML IZ SOLN FOR KALEIDOSCOPE
2.0000 mg | INTRAVITREAL | Status: AC | PRN
  Administered 2021-06-15: 2 mg via INTRAVITREAL

## 2021-07-09 NOTE — Progress Notes (Signed)
[facility] Note  07/12/2021     CHIEF COMPLAINT Patient presents for Retina Follow Up   HISTORY OF PRESENT ILLNESS: Cheryl Leonard is a 76 y.o. female who presents to the clinic today for:   HPI     Retina Follow Up   Patient presents with  CRVO/BRVO (IVE OD #38 06.12.23).  In right eye.  This started months ago.  Severity is moderate.  Duration of 4 weeks.  Since onset it is stable.  I, the attending physician,  performed the HPI with the patient and updated documentation appropriately.        Comments   Patient feels that the vision is the same since her last visit. She admits to seeing floaters but not sure which eye. Her blood sugar was 150 today and her A1C is 7.0      Last edited by Bernarda Caffey, MD on 07/12/2021  9:37 PM.    Pt states vision seems the same, but she was painting and her eyes got tired so she had to stop, A1c was 6.6 on July 1, she states her BP has been fluctuating   Referring physician: Rusty Aus, MD [place],  [place] [id number]  HISTORICAL INFORMATION:   Selected notes from the MEDICAL RECORD NUMBER Referred by Dr. Marvel Plan for concern of DME OD Lab Results  Component Value Date   HGBA1C 7.3 (H) 01/11/2021     CURRENT MEDICATIONS: No current outpatient medications on file. (Ophthalmic Drugs)   No current facility-administered medications for this visit. (Ophthalmic Drugs)   Current Outpatient Medications (Other)  Medication Sig   ALPRAZolam (XANAX) 0.25 MG tablet Take 0.25 mg by mouth daily as needed for anxiety.   amLODipine (NORVASC) 5 MG tablet Take 5 mg by mouth 2 (two) times daily.   aspirin EC 81 MG tablet Take 1 tablet (81 mg total) by mouth daily.   cholecalciferol (VITAMIN D) 1000 units tablet Take 1,000 Units by mouth 2 (two) times daily.   CORAL CALCIUM PO Take 1 tablet by mouth 2 (two) times daily.    denosumab (PROLIA) 60 MG/ML SOLN  injection Inject 60 mg into the skin every 6 (six) months.    escitalopram (LEXAPRO) 10 MG tablet Take 10 mg by mouth daily.   estradiol (ESTRACE) 0.1 MG/GM vaginal cream Place 1 Applicatorful vaginally daily as needed (vaginal irritation).   famotidine (PEPCID) 40 MG tablet Take 40 mg by mouth daily.   furosemide (LASIX) 20 MG tablet Take 20 mg by mouth every Wednesday.   gabapentin (NEURONTIN) 300 MG capsule Take 300 mg by mouth at bedtime as needed (pain).   icosapent Ethyl (VASCEPA) 1 g capsule Take 2 g by mouth 2 (two) times daily.   levothyroxine (SYNTHROID, LEVOTHROID) 100 MCG tablet Take 100 mcg by mouth daily before breakfast.    lidocaine-prilocaine (EMLA) cream Apply 1 application topically as needed (apply prior to port a cath access).   Magnesium 500 MG TABS Take 500 mg by mouth every morning.    metFORMIN (GLUCOPHAGE) 1000 MG tablet Take 1,000 mg by mouth 2 (two) times daily with a meal.   metoprolol succinate (TOPROL-XL) 50 MG 24 hr tablet Take 50 mg by mouth daily. Take with or immediately following a meal.   mirtazapine (REMERON) 15 MG tablet Take 15 mg by mouth as needed.   Multiple Vitamin (MULTIVITAMIN WITH MINERALS) TABS tablet Take 1 tablet by mouth daily. Centrum  Silver   nystatin cream (MYCOSTATIN) Apply 1 application topically daily as needed (Yeast infection).   olmesartan (BENICAR) 20 MG tablet Take 20 mg by mouth daily.   pantoprazole sodium (PROTONIX) 40 mg Take 40 mg by mouth every morning.    rivaroxaban (XARELTO) 20 MG TABS tablet Take 1 tablet (20 mg total) by mouth daily with supper.   rosuvastatin (CRESTOR) 20 MG tablet Take 20 mg by mouth every morning.   TRESIBA FLEXTOUCH 200 UNIT/ML SOPN Inject 25-30 Units as directed at bedtime. Titrate according to fasting blood glucose not to exceed 50 units a day   vitamin B-12 (CYANOCOBALAMIN) 1000 MCG tablet Take 1,000 mcg by mouth daily.   vitamin C (ASCORBIC ACID) 250 MG tablet Take 250 mg by mouth daily.   vitamin  E 400 UNIT capsule Take 400 Units by mouth daily.   zolpidem (AMBIEN) 10 MG tablet Take 10 mg by mouth at bedtime.   No current facility-administered medications for this visit. (Other)   REVIEW OF SYSTEMS: ROS   Positive for: Gastrointestinal, Musculoskeletal, Endocrine, Cardiovascular, Eyes, Respiratory Negative for: Constitutional, Neurological, Skin, Genitourinary, HENT, Psychiatric, Allergic/Imm, Heme/Lymph Last edited by Julieanne Cotton, COT on 07/12/2021  2:14 PM.     ALLERGIES No Known Allergies  PAST MEDICAL HISTORY Past Medical History:  Diagnosis Date   Anemia    Anxiety    Arthritis    Gout   Cataracts, both eyes    Diabetic retinopathy (HCC)    NPDR OU   Diverticulitis    GERD (gastroesophageal reflux disease)    Gout    Headache    h/o migraines   History of fracture of patella    right knee   History of positive PPD    Patient always shows positive   Hyperlipidemia    Hypertension    Hypertensive retinopathy    OU   Hypothyroidism    Lichen sclerosus 12/30/2013   of vulva   Metatarsal fracture    Neuropathy    Osteopenia    Peripheral vascular disease (HCC)    Polyneuropathy    numbness and tingling in feet and toes   Renal insufficiency    Stage 3   Sleep apnea    does not use cpap-lost weight    Type 2 diabetes mellitus, uncontrolled    Past Surgical History:  Procedure Laterality Date   ABDOMINAL HYSTERECTOMY     AMPUTATION TOE Right 05/08/2020   Procedure: AMPUTATION TOE-Right 4th Toe;  Surgeon: Rosetta Posner, DPM;  Location: [facility];  Service: Podiatry;  Laterality: Right;   AMPUTATION TOE Left 04/22/2021   Procedure: AMPUTATION TOE - 4TH METARSOPHANGEAL JOINT;  Surgeon: Rosetta Posner, DPM;  Location: [facility];  Service: Podiatry;  Laterality: Left;   APPENDECTOMY     BREAST REDUCTION SURGERY  2001   CATARACT EXTRACTION     CESAREAN SECTION  1976   COLONOSCOPY  03/05/2013   Nml - due for repeat 03/06/2018   COLONOSCOPY WITH  PROPOFOL N/A 03/18/2019   Procedure: COLONOSCOPY WITH PROPOFOL;  Surgeon: Toledo, [name], MD;  Location: [facility];  Service: Gastroenterology;  Laterality: N/A;   DIAGNOSTIC LAPAROSCOPY     DILATION AND CURETTAGE OF UTERUS  1989   ENDARTERECTOMY FEMORAL Bilateral 03/09/2018   Procedure: ENDARTERECTOMY FEMORAL;  Surgeon: Annice Needy, MD;  Location: [facility];  Service: Vascular;  Laterality: Bilateral;   ENDARTERECTOMY POPLITEAL Left 03/09/2018   Procedure: ENDARTERECTOMY POPLITEAL AND SFA;  Surgeon: Annice Needy, MD;  Location:  [facility];  Service: Vascular;  Laterality: Left;   ESOPHAGOGASTRODUODENOSCOPY  03/05/2013   ESOPHAGOGASTRODUODENOSCOPY (EGD) WITH PROPOFOL N/A 03/18/2019   Procedure: ESOPHAGOGASTRODUODENOSCOPY (EGD) WITH PROPOFOL;  Surgeon: Toledo, [name], MD;  Location: [facility];  Service: Gastroenterology;  Laterality: N/A;   EYE SURGERY     Eyelid Surgery  2012   INTRAMEDULLARY (IM) NAIL INTERTROCHANTERIC Left 10/30/2015   Procedure: INTRAMEDULLARY (IM) NAIL INTERTROCHANTRIC ;  Surgeon: Kennedy Bucker, MD;  Location: [facility];  Service: Orthopedics;  Laterality: Left;   KYPHOPLASTY N/A 10/25/2018   Procedure: L4 KYPHOPLASTY;  Surgeon: Kennedy Bucker, MD;  Location: [facility];  Service: Orthopedics;  Laterality: N/A;   LAPAROSCOPIC HYSTERECTOMY  2000   total   LOWER EXTREMITY ANGIOGRAPHY Left 03/08/2017   Procedure: LOWER EXTREMITY ANGIOGRAPHY;  Surgeon: Annice Needy, MD;  Location: [facility];  Service: Cardiovascular;  Laterality: Left;   LOWER EXTREMITY ANGIOGRAPHY Left 10/30/2017   Procedure: LOWER EXTREMITY ANGIOGRAPHY;  Surgeon: Annice Needy, MD;  Location: [facility];  Service: Cardiovascular;  Laterality: Left;   LOWER EXTREMITY ANGIOGRAPHY Right 03/08/2018   Procedure: LOWER EXTREMITY ANGIOGRAPHY;  Surgeon: Annice Needy, MD;  Location: [facility];  Service: Cardiovascular;  Laterality: Right;   LOWER EXTREMITY ANGIOGRAPHY Left 10/01/2018    Procedure: LOWER EXTREMITY ANGIOGRAPHY;  Surgeon: Annice Needy, MD;  Location: [facility];  Service: Cardiovascular;  Laterality: Left;   LOWER EXTREMITY ANGIOGRAPHY Right 10/08/2018   Procedure: LOWER EXTREMITY ANGIOGRAPHY;  Surgeon: Annice Needy, MD;  Location: [facility];  Service: Cardiovascular;  Laterality: Right;   LOWER EXTREMITY ANGIOGRAPHY Right 05/07/2020   Procedure: Lower Extremity Angiography;  Surgeon: Annice Needy, MD;  Location: [facility];  Service: Cardiovascular;  Laterality: Right;   LYSIS OF ADHESION  01/25/2021   Procedure: LYSIS OF ADHESION;  Surgeon: Campbell Lerner, MD;  Location: [facility];  Service: General;;   PERIPHERAL VASCULAR INTERVENTION  03/08/2018   Procedure: PERIPHERAL VASCULAR INTERVENTION;  Surgeon: Annice Needy, MD;  Location: [facility];  Service: Cardiovascular;;   PORTA CATH INSERTION N/A 02/17/2020   Procedure: PORTA CATH INSERTION;  Surgeon: Annice Needy, MD;  Location: [facility];  Service: Cardiovascular;  Laterality: N/A;   REDUCTION MAMMAPLASTY  1997   SACROPLASTY N/A 10/25/2018   Procedure: S1 SACROPLASTY;  Surgeon: Kennedy Bucker, MD;  Location: [facility];  Service: Orthopedics;  Laterality: N/A;   FAMILY HISTORY Family History  Problem Relation Age of Onset   Coronary artery disease Father    Heart attack Father    Coronary artery disease Mother    Heart attack Mother    Ovarian cancer Sister 77       sister had hormonal therapy for IVF txs-which increased risk factor for ovarian cancer   Breast cancer Neg Hx    SOCIAL HISTORY Social History   Tobacco Use   Smoking status: Former    Packs/day: 1.00    Years: 20.00    Total pack years: 20.00    Types: Cigarettes    Quit date: 03/07/1996    Years since quitting: 25.3   Smokeless tobacco: Never   Tobacco comments:    started smoking at age 36 but stopped smoking in 2000  Vaping Use   Vaping Use: Never used  Substance Use Topics    Alcohol use: No    Alcohol/week: 0.0 standard drinks of alcohol   Drug use: No  OPHTHALMIC EXAM: Base Eye Exam     Visual Acuity (Snellen - Linear)       Right Left   Dist [place] 20/30 +1 20/20   Dist ph [place] NI          Tonometry (Tonopen, 2:19 PM)       Right Left   Pressure 20 19         Pupils       Pupils Dark Light Shape React APD   Right PERRL 3 2 Round Brisk None   Left PERRL 3 2 Round Brisk None         Visual Fields       Left Right    Full Full         Extraocular Movement       Right Left    Full, Ortho Full, Ortho         Neuro/Psych     Oriented x3: Yes   Mood/Affect: Normal         Dilation     Both eyes: 1.0% Mydriacyl, 2.5% Phenylephrine @ 2:16 PM           Slit Lamp and Fundus Exam     External Exam       Right Left   External Normal Normal         Slit Lamp Exam       Right Left   Lids/Lashes dermatochalasis dermatochalasis   Conjunctiva/Sclera White and quiet White and quiet   Cornea arcus; well healed cataract wound; 2-3+ diffuse Punctate epithelial erosions, decreased TBUT, mild Anterior basement membrane dystrophy superiorly arcus; well healed cataract wound, 2-3+ diffuse Punctate epithelial erosions, irregualr epi surface, decreased TBUT   Anterior Chamber Deep and quiet Deep and quiet   Iris Round and dilated Round and dilated   Lens PCIOL; open PC PCIOL; open PC   Anterior Vitreous syneresis, Posterior vitreous detachment, vitreous condensations inferiorly syneresis, Posterior vitreous detachment         Fundus Exam       Right Left   Disc Superior hyperemia, mild Pallor Pink and Sharp   C/D Ratio 0.6 0.5   Macula Flat, Blunted foveal reflex, trace cystic changes - slightly increased, +Epiretinal membrane, +MA flat; good foveal reflex, no heme or edema, small pigment clump IT to fovea   Vessels attenuated, Tortuous attenuated, Tortuous   Periphery Attached; 3 focal DBH temporal periphery  Attached, no heme           IMAGING AND PROCEDURES  Imaging and Procedures for 04/25/17  OCT, Retina - OU - Both Eyes       Right Eye Quality was good. Central Foveal Thickness: 392. Progression has worsened. Findings include no SRF, abnormal foveal contour, epiretinal membrane, intraretinal fluid (Interval increase in IRF/central edema).   Left Eye Quality was good. Central Foveal Thickness: 285. Progression has been stable. Findings include normal foveal contour, no IRF, no SRF (Trace ERM).   Notes *Images captured and stored on drive  Diagnosis / Impression:  OD: BRVO w/ CME - Interval increase in IRF/cystic changes OS: NFP; no IRF/SRF--stable, trace ERM  Clinical management:  See below  Abbreviations: NFP - Normal foveal profile. CME - cystoid macular edema. PED - pigment epithelial detachment. IRF - intraretinal fluid. SRF - subretinal fluid. EZ - ellipsoid zone. ERM - epiretinal membrane. ORA - outer retinal atrophy. ORT - outer retinal tubulation. SRHM - subretinal hyper-reflective material      Intravitreal Injection, Pharmacologic Agent -  OD - Right Eye       Time Out 07/12/2021. 3:48 PM. Confirmed correct patient, procedure, site, and patient consented.   Anesthesia Topical anesthesia was used. Anesthetic medications included Lidocaine 2%, Proparacaine 0.5%.   Procedure Preparation included 5% betadine to ocular surface, eyelid speculum. A (32 g) needle was used.   Injection: 2 mg aflibercept 2 MG/0.05ML   Route: Intravitreal, Site: Right Eye   NDC: [id number], Lot: [id number], Expiration date: 04/02/2022, Waste: 0 mL   Post-op Post injection exam found visual acuity of at least counting fingers. The patient tolerated the procedure well. There were no complications. The patient received written and verbal post procedure care education. Post injection medications were not given.      Ferritin      Component Value Flag Ref Range Units Status    Ferritin 8      11 - 307 ng/mL Final   Comment:   Performed at [facility], [address] [place], [place] [address]           Iron and TIBC      Component Value Flag Ref Range Units Status   Iron 58      28 - 170 ug/dL Final   TIBC 614      431 - 450 ug/dL Final   Saturation Ratios 15      10.4 - 31.8 % Final   UIBC 324       ug/dL Final   Comment:   Performed at [facility], [address] [place], [place] [address]           Basic metabolic panel      Component Value Flag Ref Range Units Status   Sodium 134      135 - 145 mmol/L Final   Potassium 4.0      3.5 - 5.1 mmol/L Final   Chloride 102      98 - 111 mmol/L Final   CO2 22      22 - 32 mmol/L Final   Glucose, Bld 216      70 - 99 mg/dL Final   Comment:   Glucose reference range applies only to samples taken after fasting for at least 8 hours.   BUN 37      8 - 23 mg/dL Final   Creatinine, Ser 1.37      0.44 - 1.00 mg/dL Final   Calcium 9.4      8.9 - 10.3 mg/dL Final   GFR, Estimated 40      >60 mL/min Final   Comment:   (NOTE) Calculated using the CKD-EPI Creatinine Equation (2021)    Anion gap 10      5 - 15  Final   Comment:   Performed at [facility], [address] [place], [place] [address]           CBC with Differential/Platelet      Component Value Flag Ref Range Units Status   WBC 5.0      4.0 - 10.5 K/uL Final   RBC 2.98      3.87 - 5.11 MIL/uL Final   Hemoglobin 7.9      12.0 - 15.0 g/dL Final   HCT 50.9      32.6 - 46.0 % Final   MCV 86.6      80.0 - 100.0 fL Final   MCH 26.5      26.0 - 34.0 pg Final   MCHC 30.6  30.0 - 36.0 g/dL Final   RDW 14.6      11.5 - 15.5 % Final   Platelets 244      150 - 400 K/uL Final   nRBC 0.0      0.0 - 0.2 % Final   Neutrophils Relative % 68       % Final   Neutro Abs 3.4      1.7 - 7.7 K/uL Final   Lymphocytes Relative 18       % Final   Lymphs Abs 0.9      0.7 - 4.0 K/uL Final   Monocytes Relative 10        % Final   Monocytes Absolute 0.5      0.1 - 1.0 K/uL Final   Eosinophils Relative 3       % Final   Eosinophils Absolute 0.2      0.0 - 0.5 K/uL Final   Basophils Relative 1       % Final   Basophils Absolute 0.0      0.0 - 0.1 K/uL Final   Immature Granulocytes 0       % Final   Abs Immature Granulocytes 0.01      0.00 - 0.07 K/uL Final   Comment:   Performed at [facility], [address] [place], [place] [address]                  ASSESSMENT/PLAN:    ICD-10-CM   1. 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 Both eyes affected by mild nonproliferative diabetic retinopathy with macular edema, associated with type 2 diabetes mellitus ([place])  [id number]     3. Essential hypertension  I10     4. Hypertensive retinopathy of both eyes  H35.033     5. Epiretinal membrane (ERM) of right eye  H35.371     6. Pseudophakia of both eyes  Z96.1       1. BRVO w/ CME OD  - by history, pt states symptoms first noticed 2 wks prior to presentation, but reports changes may have occurred prior  - initial exam with differential tortuosity of vessels (OD > OS)  - FA (02.10.20) shows mild late staining / leakage in macula, staining / leakage of disc -- improving CME  - differential includes DM2 (DME), hypertensive retinopathy, inflammatory etiology / uveitis  - S/P IVA OD #1 (02.08.19), #2 (03.11.19), #3 (04.09.19), #4 (05.20.19), #5 (02.10.20)  - gave IVA OD on 2.10.20 due to pending Eylea4U for 2020 -- resulted in increased IRF/CME  - review of OCTs show persistent IRF and cystic changes --  resistance to IVA   - June 2019 -- switched therapies: S/P IVE OD #1 (06.24.19), #2 (07.24.19), #3 (09.04.19), #4 (10.30.19),#5 (12.30.19), #6 (03.23.20), #7 (05.05.20), #8 (07.16.20), #9 (07.17.20), #10 (08.28.20), #11 (10.13.20), # 12  (11.17.20), #13 (2.8.21), #14 (03.09.21), #15 (04.13.21), #16 (05.11.21), #17 (06.17.21), #18 (07.23.21), #19 (08.30.21), #20 (10.04.21), #21 (11.08.21), #22 (12.08.21), #23 (01.31.22), #24 (02.28.22), #25 (04.01.22), #26 (06.15.22), #27 (07.13.22), #28 (08.17.22), #29 (09.21.22), #30 (10.19.22), #31 (11.16.22), #32 (12.16.22), #33 (01.13.23), #34 (02.10.23), #35 (03.15.23), #36 (04.14.23), #37 (05.15.23), #38 (06.12.23)  - OCT today shows interval increase in IRF/cystic changes at 4 weeks  - BCVA 20/30 -- stable  -  Eylea4U benefits investigation completed and pt approved for IVE for 2023  - recommend IVE OD #39 today, 0.12.23 w/ f/u in 4 wks  - RBA of procedure discussed, questions answered  - informed consent obtained  - see procedure note  - Eylea informed consent form obtained, re-signed and scanned on 05.15.23  - f/u 4 weeks  -- DFE/OCT/possible injection  2. Mild nonproliferative diabetic retinopathy, both eyes  - A1c 6.6 on 07.01.23, 6.0 on 10.14.22  - could be contributing to CME OD  - OS with minimal diabetic retinopathy  - continue to monitor  3,4. Hypertensive retinopathy OU - stable  - as above, may have contributing to CME OD  - discussed importance of tight BP control  - monitor  5. Epiretinal membrane, right eye   - stable nasal ERM  - no indication for surgery at this time  6. Pseudophakia OU  - s/p CE/IOL OU by cataract surgeon in [facility]  - doing well  - monitor  Ophthalmic Meds Ordered this visit:  Meds ordered this encounter  Medications   aflibercept (EYLEA) SOLN 2 mg     Return in about 4 weeks (around 08/09/2021) for f/u BRVO OD, DFE, OCT.  This document serves as a record of services personally performed by Gardiner Sleeper, MD, PhD. It was created on their behalf by Roselee Nova, COMT. The creation of this record is the provider's dictation and/or activities during the visit.  Electronically signed by: Roselee Nova, COMT 07/12/21 9:38 PM  This document  serves as a record of services personally performed by Gardiner Sleeper, MD, PhD. It was created on their behalf by San Jetty. Owens Shark, OA an ophthalmic technician. The creation of this record is the provider's dictation and/or activities during the visit.    Electronically signed by: San Jetty. Owens Shark, [place] 07.10.2023 9:38 PM  Gardiner Sleeper, M.D., Ph.D. Diseases & Surgery of the Retina and Vitreous [facility]  I have reviewed the above documentation for accuracy and completeness, and I agree with the above. Gardiner Sleeper, M.D., Ph.D. 07/12/21 9:40 PM  Abbreviations: M myopia (nearsighted); A astigmatism; H hyperopia (farsighted); P presbyopia; Mrx spectacle prescription;  CTL contact lenses; OD right eye; OS left eye; OU both eyes  XT exotropia; ET esotropia; PEK punctate epithelial keratitis; PEE punctate epithelial erosions; DES dry eye syndrome; MGD meibomian gland dysfunction; ATs artificial tears; PFAT's preservative free artificial tears; [place] nuclear sclerotic cataract; PSC posterior subcapsular cataract; ERM epi-retinal membrane; PVD posterior vitreous detachment; RD retinal detachment; DM diabetes mellitus; DR diabetic retinopathy; NPDR non-proliferative diabetic retinopathy; PDR proliferative diabetic retinopathy; CSME clinically significant macular edema; DME diabetic macular edema; dbh dot blot hemorrhages; CWS cotton wool spot; POAG primary open angle glaucoma; C/D cup-to-disc ratio; HVF humphrey visual field; GVF goldmann visual field; OCT optical coherence tomography; IOP intraocular pressure; BRVO Branch retinal vein occlusion; CRVO central retinal vein occlusion; CRAO central retinal artery occlusion; BRAO branch retinal artery occlusion; RT retinal tear; SB scleral buckle; PPV pars plana vitrectomy; VH Vitreous hemorrhage; PRP panretinal laser photocoagulation; IVK intravitreal kenalog; VMT vitreomacular traction; MH Macular hole;  NVD neovascularization of the disc;  NVE neovascularization elsewhere; AREDS age related eye disease study; ARMD age related macular degeneration; POAG primary open angle glaucoma; EBMD epithelial/anterior basement membrane dystrophy; ACIOL anterior chamber intraocular lens; IOL intraocular lens; PCIOL posterior chamber intraocular lens; Phaco/IOL phacoemulsification with intraocular lens placement; PRK photorefractive keratectomy; LASIK laser assisted in situ keratomileusis; HTN hypertension; DM diabetes  mellitus; COPD chronic obstructive pulmonary disease

## 2021-07-12 ENCOUNTER — Ambulatory Visit (INDEPENDENT_AMBULATORY_CARE_PROVIDER_SITE_OTHER): Payer: Medicare Other | Admitting: Ophthalmology

## 2021-07-12 ENCOUNTER — Inpatient Hospital Stay (HOSPITAL_BASED_OUTPATIENT_CLINIC_OR_DEPARTMENT_OTHER): Payer: Medicare Other | Admitting: Internal Medicine

## 2021-07-12 ENCOUNTER — Inpatient Hospital Stay: Payer: Medicare Other | Attending: Internal Medicine

## 2021-07-12 ENCOUNTER — Inpatient Hospital Stay: Payer: Medicare Other

## 2021-07-12 ENCOUNTER — Encounter (INDEPENDENT_AMBULATORY_CARE_PROVIDER_SITE_OTHER): Payer: Self-pay | Admitting: Ophthalmology

## 2021-07-12 ENCOUNTER — Encounter: Payer: Self-pay | Admitting: Internal Medicine

## 2021-07-12 DIAGNOSIS — I10 Essential (primary) hypertension: Secondary | ICD-10-CM | POA: Diagnosis not present

## 2021-07-12 DIAGNOSIS — Z87891 Personal history of nicotine dependence: Secondary | ICD-10-CM | POA: Diagnosis not present

## 2021-07-12 DIAGNOSIS — D509 Iron deficiency anemia, unspecified: Secondary | ICD-10-CM | POA: Diagnosis present

## 2021-07-12 DIAGNOSIS — Z79899 Other long term (current) drug therapy: Secondary | ICD-10-CM | POA: Diagnosis not present

## 2021-07-12 DIAGNOSIS — H34831 Tributary (branch) retinal vein occlusion, right eye, with macular edema: Secondary | ICD-10-CM | POA: Diagnosis not present

## 2021-07-12 DIAGNOSIS — E1122 Type 2 diabetes mellitus with diabetic chronic kidney disease: Secondary | ICD-10-CM | POA: Insufficient documentation

## 2021-07-12 DIAGNOSIS — H35033 Hypertensive retinopathy, bilateral: Secondary | ICD-10-CM

## 2021-07-12 DIAGNOSIS — E113213 Type 2 diabetes mellitus with mild nonproliferative diabetic retinopathy with macular edema, bilateral: Secondary | ICD-10-CM | POA: Diagnosis not present

## 2021-07-12 DIAGNOSIS — D649 Anemia, unspecified: Secondary | ICD-10-CM | POA: Diagnosis not present

## 2021-07-12 DIAGNOSIS — Z961 Presence of intraocular lens: Secondary | ICD-10-CM

## 2021-07-12 DIAGNOSIS — Z7984 Long term (current) use of oral hypoglycemic drugs: Secondary | ICD-10-CM | POA: Insufficient documentation

## 2021-07-12 DIAGNOSIS — N183 Chronic kidney disease, stage 3 unspecified: Secondary | ICD-10-CM | POA: Insufficient documentation

## 2021-07-12 DIAGNOSIS — H35371 Puckering of macula, right eye: Secondary | ICD-10-CM

## 2021-07-12 DIAGNOSIS — I129 Hypertensive chronic kidney disease with stage 1 through stage 4 chronic kidney disease, or unspecified chronic kidney disease: Secondary | ICD-10-CM | POA: Insufficient documentation

## 2021-07-12 LAB — FERRITIN: Ferritin: 8 ng/mL — ABNORMAL LOW (ref 11–307)

## 2021-07-12 LAB — CBC WITH DIFFERENTIAL/PLATELET
Abs Immature Granulocytes: 0.01 10*3/uL (ref 0.00–0.07)
Basophils Absolute: 0 10*3/uL (ref 0.0–0.1)
Basophils Relative: 1 %
Eosinophils Absolute: 0.2 10*3/uL (ref 0.0–0.5)
Eosinophils Relative: 3 %
HCT: 25.8 % — ABNORMAL LOW (ref 36.0–46.0)
Hemoglobin: 7.9 g/dL — ABNORMAL LOW (ref 12.0–15.0)
Immature Granulocytes: 0 %
Lymphocytes Relative: 18 %
Lymphs Abs: 0.9 10*3/uL (ref 0.7–4.0)
MCH: 26.5 pg (ref 26.0–34.0)
MCHC: 30.6 g/dL (ref 30.0–36.0)
MCV: 86.6 fL (ref 80.0–100.0)
Monocytes Absolute: 0.5 10*3/uL (ref 0.1–1.0)
Monocytes Relative: 10 %
Neutro Abs: 3.4 10*3/uL (ref 1.7–7.7)
Neutrophils Relative %: 68 %
Platelets: 244 10*3/uL (ref 150–400)
RBC: 2.98 MIL/uL — ABNORMAL LOW (ref 3.87–5.11)
RDW: 14.6 % (ref 11.5–15.5)
WBC: 5 10*3/uL (ref 4.0–10.5)
nRBC: 0 % (ref 0.0–0.2)

## 2021-07-12 LAB — BASIC METABOLIC PANEL
Anion gap: 10 (ref 5–15)
BUN: 37 mg/dL — ABNORMAL HIGH (ref 8–23)
CO2: 22 mmol/L (ref 22–32)
Calcium: 9.4 mg/dL (ref 8.9–10.3)
Chloride: 102 mmol/L (ref 98–111)
Creatinine, Ser: 1.37 mg/dL — ABNORMAL HIGH (ref 0.44–1.00)
GFR, Estimated: 40 mL/min — ABNORMAL LOW (ref >60)
Glucose, Bld: 216 mg/dL — ABNORMAL HIGH (ref 70–99)
Potassium: 4 mmol/L (ref 3.5–5.1)
Sodium: 134 mmol/L — ABNORMAL LOW (ref 135–145)

## 2021-07-12 LAB — IRON AND TIBC
Iron: 58 ug/dL (ref 28–170)
Saturation Ratios: 15 % (ref 10.4–31.8)
TIBC: 382 ug/dL (ref 250–450)
UIBC: 324 ug/dL

## 2021-07-12 MED ORDER — HEPARIN SOD (PORK) LOCK FLUSH 100 UNIT/ML IV SOLN
INTRAVENOUS | Status: AC
  Filled 2021-07-12: qty 5

## 2021-07-12 MED ORDER — AFLIBERCEPT 2MG/0.05ML IZ SOLN FOR KALEIDOSCOPE
2.0000 mg | INTRAVITREAL | Status: AC | PRN
  Administered 2021-07-12: 2 mg via INTRAVITREAL

## 2021-07-12 MED ORDER — SODIUM CHLORIDE 0.9 % IV SOLN
200.0000 mg | Freq: Once | INTRAVENOUS | Status: AC
  Administered 2021-07-12: 200 mg via INTRAVENOUS
  Filled 2021-07-12: qty 10

## 2021-07-12 MED ORDER — SODIUM CHLORIDE 0.9 % IV SOLN
Freq: Once | INTRAVENOUS | Status: AC
  Filled 2021-07-12: qty 250

## 2021-07-12 NOTE — Assessment & Plan Note (Addendum)
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  Improved s/p IV iron infusions however today- Hb 7.9. awaiting iron studies today. Proceed with Venofer today.  # Discussed use of erythropoietin stimulating agents like retacrit to stimulate the bone marrow.  Discussed the potential issues with erythropoietin estimating agents-given the risk of stroke thromboembolic events/elevated blood pressure.  However, most of the serious events did not happen when the goal hematocrit is 33/hemoglobin 30.  Await repeat blood work in 6 weeks; if hemoglobin consistently low-recommend starting retacrit.  Also discussed re: bone marrow Biopsy; will hold off for now.   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   # Etiology-CKD-III; GFR ~35 overall -stable [Dr.Kolluru]; recommend continued hydration.  #Poor IV access/Mediport placement-stable  # DISPOSITION:  # Venofer today; NO retacrit # venofer in 1 weeks # venofer in 2 weeks.  # follow up 6 weeks MD; port; labs- cbc/bmp; ;possible retacrit-venofer- Dr.B  

## 2021-07-12 NOTE — Patient Instructions (Signed)

## 2021-07-12 NOTE — Progress Notes (Signed)
[place] NOTE  Patient Care Team: Rusty Aus, MD as PCP - General (Internal Medicine) Josefine Class, MD as Referring Physician (Gastroenterology) Cammie Sickle, MD as Consulting Physician (Hematology and Oncology)  CHIEF COMPLAINTS/PURPOSE OF CONSULTATION: Anemia  HEMATOLOGY HISTORY  # ANEMIA- Jan 2021- 8.8/ferritin 11 [PCP]; N-WBC/platelets? IDA vs other- EGD-2015/colonoscopy-? 2015; 2020- [Dr.Skulskie] ; capsule-2016- ? 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   HISTORY OF PRESENTING ILLNESS: Ambulating independently.  Alone.  Cheryl Leonard 76 y.o.  female anemia iron deficiency-question CKD-III s here for follow-up.   Patient states that she is s/p amputation of the left toe. Complains of mild to moderate fatigue.  Denies any blood in stools or black-colored stools.  Review of Systems  Constitutional:  Positive for malaise/fatigue. Negative for chills, diaphoresis and fever.  HENT:  Negative for nosebleeds and sore throat.   Eyes:  Negative for double vision.  Respiratory:  Positive for shortness of breath. Negative for cough, hemoptysis, sputum production and wheezing.   Cardiovascular:  Negative for chest pain, palpitations, orthopnea and leg swelling.  Gastrointestinal:  Negative for abdominal pain, blood in stool, constipation, diarrhea, heartburn, melena, nausea and vomiting.  Genitourinary:  Negative for dysuria, frequency and urgency.  Musculoskeletal:  Positive for joint pain.  Skin: Negative.  Negative for itching and rash.  Neurological:  Negative for dizziness, tingling, focal weakness, weakness and headaches.  Endo/Heme/Allergies:  Does not bruise/bleed easily.  Psychiatric/Behavioral:  Negative for depression. The patient is not nervous/anxious and does not have insomnia.      MEDICAL HISTORY:  Past Medical History:  Diagnosis Date   Anemia    Anxiety    Arthritis    Gout   Cataracts, both eyes    Diabetic retinopathy ([place])    NPDR OU   Diverticulitis    GERD (gastroesophageal reflux disease)    Gout    Headache    h/o migraines   History of fracture of patella    right knee   History of positive PPD    Patient always shows positive   Hyperlipidemia    Hypertension    Hypertensive retinopathy    OU   Hypothyroidism    Lichen sclerosus 18/56/3149   of vulva   Metatarsal fracture    Neuropathy    Osteopenia    Peripheral vascular disease (HCC)    Polyneuropathy    numbness and tingling in feet and toes   Renal insufficiency    Stage 3   Sleep apnea    does not use cpap-lost weight    Type 2 diabetes mellitus, uncontrolled     SURGICAL HISTORY: Past Surgical History:  Procedure Laterality Date   ABDOMINAL HYSTERECTOMY     AMPUTATION TOE Right 05/08/2020   Procedure: AMPUTATION TOE-Right 4th Toe;  Surgeon: Caroline More, DPM;  Location: [facility];  Service: Podiatry;  Laterality: Right;   AMPUTATION TOE Left 04/22/2021   Procedure: AMPUTATION TOE - 4TH METARSOPHANGEAL JOINT;  Surgeon: Caroline More, DPM;  Location: [facility];  Service: Podiatry;  Laterality: Left;   APPENDECTOMY     BREAST REDUCTION SURGERY  2001   CATARACT EXTRACTION     CESAREAN SECTION  1976   COLONOSCOPY  03/05/2013   Nml - due for repeat 03/06/2018   COLONOSCOPY WITH PROPOFOL N/A 03/18/2019   Procedure: COLONOSCOPY  WITH PROPOFOL;  Surgeon: Toledo, Benay Pike, MD;  Location: [facility];  Service: Gastroenterology;  Laterality: N/A;   DIAGNOSTIC LAPAROSCOPY     DILATION AND CURETTAGE OF UTERUS  1989   ENDARTERECTOMY FEMORAL Bilateral 03/09/2018   Procedure: ENDARTERECTOMY FEMORAL;  Surgeon: Algernon Huxley, MD;  Location: [facility];  Service: Vascular;  Laterality: Bilateral;   ENDARTERECTOMY POPLITEAL Left 03/09/2018   Procedure: ENDARTERECTOMY POPLITEAL AND SFA;   Surgeon: Algernon Huxley, MD;  Location: [facility];  Service: Vascular;  Laterality: Left;   ESOPHAGOGASTRODUODENOSCOPY  03/05/2013   ESOPHAGOGASTRODUODENOSCOPY (EGD) WITH PROPOFOL N/A 03/18/2019   Procedure: ESOPHAGOGASTRODUODENOSCOPY (EGD) WITH PROPOFOL;  Surgeon: Toledo, Benay Pike, MD;  Location: [facility];  Service: Gastroenterology;  Laterality: N/A;   EYE SURGERY     Eyelid Surgery  2012   INTRAMEDULLARY (IM) NAIL INTERTROCHANTERIC Left 10/30/2015   Procedure: INTRAMEDULLARY (IM) NAIL INTERTROCHANTRIC ;  Surgeon: Hessie Knows, MD;  Location: [facility];  Service: Orthopedics;  Laterality: Left;   KYPHOPLASTY N/A 10/25/2018   Procedure: L4 KYPHOPLASTY;  Surgeon: Hessie Knows, MD;  Location: [facility];  Service: Orthopedics;  Laterality: N/A;   LAPAROSCOPIC HYSTERECTOMY  2000   total   LOWER EXTREMITY ANGIOGRAPHY Left 03/08/2017   Procedure: LOWER EXTREMITY ANGIOGRAPHY;  Surgeon: Algernon Huxley, MD;  Location: [place] [facility];  Service: Cardiovascular;  Laterality: Left;   LOWER EXTREMITY ANGIOGRAPHY Left 10/30/2017   Procedure: LOWER EXTREMITY ANGIOGRAPHY;  Surgeon: Algernon Huxley, MD;  Location: [place] [facility];  Service: Cardiovascular;  Laterality: Left;   LOWER EXTREMITY ANGIOGRAPHY Right 03/08/2018   Procedure: LOWER EXTREMITY ANGIOGRAPHY;  Surgeon: Algernon Huxley, MD;  Location: [place] [facility];  Service: Cardiovascular;  Laterality: Right;   LOWER EXTREMITY ANGIOGRAPHY Left 10/01/2018   Procedure: LOWER EXTREMITY ANGIOGRAPHY;  Surgeon: Algernon Huxley, MD;  Location: [place] [facility];  Service: Cardiovascular;  Laterality: Left;   LOWER EXTREMITY ANGIOGRAPHY Right 10/08/2018   Procedure: LOWER EXTREMITY ANGIOGRAPHY;  Surgeon: Algernon Huxley, MD;  Location: [place] [facility];  Service: Cardiovascular;  Laterality: Right;   LOWER EXTREMITY ANGIOGRAPHY Right 05/07/2020   Procedure: Lower Extremity Angiography;  Surgeon: Algernon Huxley, MD;  Location: [place] [facility];  Service:  Cardiovascular;  Laterality: Right;   LYSIS OF ADHESION  01/25/2021   Procedure: LYSIS OF ADHESION;  Surgeon: Ronny Bacon, MD;  Location: [facility];  Service: General;;   PERIPHERAL VASCULAR INTERVENTION  03/08/2018   Procedure: PERIPHERAL VASCULAR INTERVENTION;  Surgeon: Algernon Huxley, MD;  Location: [place] [facility];  Service: Cardiovascular;;   PORTA CATH INSERTION N/A 02/17/2020   Procedure: PORTA CATH INSERTION;  Surgeon: Algernon Huxley, MD;  Location: [place] [facility];  Service: Cardiovascular;  Laterality: N/A;   REDUCTION MAMMAPLASTY  1997   SACROPLASTY N/A 10/25/2018   Procedure: S1 SACROPLASTY;  Surgeon: Hessie Knows, MD;  Location: [facility];  Service: Orthopedics;  Laterality: N/A;    SOCIAL HISTORY: Social History   Socioeconomic History   Marital status: Married    Spouse name: [name]   Number of children: 3   Years of education: Not on file   Highest education level: Not on file  Occupational History   Occupation: Retail banker  Tobacco Use   Smoking status: Former    Packs/day: 1.00    Years: 20.00    Total pack years: 20.00    Types: Cigarettes    Quit date: 03/07/1996    Years since quitting: 25.3  Smokeless tobacco: Never   Tobacco comments:    started smoking at age 75 but stopped smoking in 2000  Vaping Use   Vaping Use: Never used  Substance and Sexual Activity   Alcohol use: No    Alcohol/week: 0.0 standard drinks of alcohol   Drug use: No   Sexual activity: Yes    Partners: Male    Birth control/protection: Surgical  Other Topics Concern   Not on file  Social History Narrative   Lives in [place]; with husband; quit > 20 years; no alcohol; used to work at [employer]  at [employer].    Social Determinants of Health   Financial Resource Strain: Low Risk  (10/08/2018)   Overall Financial Resource Strain (CARDIA)    Difficulty of Paying Living Expenses: Not very hard  Food Insecurity: No Food Insecurity (10/08/2018)   Hunger Vital Sign     Worried About Running Out of Food in the Last Year: Never true    Ran Out of Food in the Last Year: Never true  Transportation Needs: Unknown (10/01/2018)   PRAPARE - Hydrologist (Medical): No    Lack of Transportation (Non-Medical): Not on file  Physical Activity: Unknown (10/01/2018)   Exercise Vital Sign    Days of Exercise per Week: 2 days    Minutes of Exercise per Session: Not on file  Stress: Stress Concern Present (10/01/2018)   [place]    Feeling of Stress : To some extent  Social Connections: Unknown (10/08/2018)   Social Connection and Isolation Panel [NHANES]    Frequency of Communication with Friends and Family: More than three times a week    Frequency of Social Gatherings with Friends and Family: Not on file    Attends Religious Services: Not on file    Active Member of Clubs or Organizations: Not on file    Attends Archivist Meetings: Not on file    Marital Status: Married  Intimate Partner Violence: Not At Risk (10/08/2018)   Humiliation, Afraid, Rape, and Kick questionnaire    Fear of Current or Ex-Partner: No    Emotionally Abused: No    Physically Abused: No    Sexually Abused: No    FAMILY HISTORY: Family History  Problem Relation Age of Onset   Coronary artery disease Father    Heart attack Father    Coronary artery disease Mother    Heart attack Mother    Ovarian cancer Sister 13       sister had hormonal therapy for IVF txs-which increased risk factor for ovarian cancer   Breast cancer Neg Hx     ALLERGIES:  has No Known Allergies.  MEDICATIONS:  Current Outpatient Medications  Medication Sig Dispense Refill   ALPRAZolam (XANAX) 0.25 MG tablet Take 0.25 mg by mouth daily as needed for anxiety.     amLODipine (NORVASC) 5 MG tablet Take 5 mg by mouth 2 (two) times daily.     aspirin EC 81 MG tablet Take 1 tablet (81 mg total) by mouth daily.  30 tablet 11   cholecalciferol (VITAMIN D) 1000 units tablet Take 1,000 Units by mouth 2 (two) times daily.     CORAL CALCIUM PO Take 1 tablet by mouth 2 (two) times daily.      denosumab (PROLIA) 60 MG/ML SOLN injection Inject 60 mg into the skin every 6 (six) months.      escitalopram (LEXAPRO) 10 MG tablet  Take 10 mg by mouth daily.     estradiol (ESTRACE) 0.1 MG/GM vaginal cream Place 1 Applicatorful vaginally daily as needed (vaginal irritation).     famotidine (PEPCID) 40 MG tablet Take 40 mg by mouth daily.     furosemide (LASIX) 20 MG tablet Take 20 mg by mouth every Wednesday.     gabapentin (NEURONTIN) 300 MG capsule Take 300 mg by mouth at bedtime as needed (pain).     icosapent Ethyl (VASCEPA) 1 g capsule Take 2 g by mouth 2 (two) times daily.     levothyroxine (SYNTHROID, LEVOTHROID) 100 MCG tablet Take 100 mcg by mouth daily before breakfast.   3   lidocaine-prilocaine (EMLA) cream Apply 1 application topically as needed (apply prior to port a cath access). 30 g 3   Magnesium 500 MG TABS Take 500 mg by mouth every morning.      metFORMIN (GLUCOPHAGE) 1000 MG tablet Take 1,000 mg by mouth 2 (two) times daily with a meal.     metoprolol succinate (TOPROL-XL) 50 MG 24 hr tablet Take 50 mg by mouth daily. Take with or immediately following a meal.     mirtazapine (REMERON) 15 MG tablet Take 15 mg by mouth as needed.     Multiple Vitamin (MULTIVITAMIN WITH MINERALS) TABS tablet Take 1 tablet by mouth daily. Centrum Silver     nystatin cream (MYCOSTATIN) Apply 1 application topically daily as needed (Yeast infection).     olmesartan (BENICAR) 20 MG tablet Take 20 mg by mouth daily.     pantoprazole sodium (PROTONIX) 40 mg Take 40 mg by mouth every morning.      rivaroxaban (XARELTO) 20 MG TABS tablet Take 1 tablet (20 mg total) by mouth daily with supper. 90 tablet 3   rosuvastatin (CRESTOR) 20 MG tablet Take 20 mg by mouth every morning.     TRESIBA FLEXTOUCH 200 UNIT/ML SOPN Inject  25-30 Units as directed at bedtime. Titrate according to fasting blood glucose not to exceed 50 units a day  5   vitamin B-12 (CYANOCOBALAMIN) 1000 MCG tablet Take 1,000 mcg by mouth daily.     vitamin C (ASCORBIC ACID) 250 MG tablet Take 250 mg by mouth daily.     vitamin E 400 UNIT capsule Take 400 Units by mouth daily.     zolpidem (AMBIEN) 10 MG tablet Take 10 mg by mouth at bedtime.     No current facility-administered medications for this visit.   Facility-Administered Medications Ordered in Other Visits  Medication Dose Route Frequency Provider Last Rate Last Admin   heparin lock flush 100 UNIT/ML injection               PHYSICAL EXAMINATION:   Vitals:   07/12/21 0932  BP: (!) 138/43  Pulse: 72  Resp: 19  Temp: (!) 96 F (35.6 C)  SpO2: 98%   Filed Weights   07/12/21 0932  Weight: 165 lb 3.2 oz (74.9 kg)    Physical Exam Constitutional:      Comments: Alone.  Ambulating independently.  HENT:     Head: Normocephalic and atraumatic.     Mouth/Throat:     Pharynx: No oropharyngeal exudate.  Eyes:     Pupils: Pupils are equal, round, and reactive to light.  Cardiovascular:     Rate and Rhythm: Normal rate and regular rhythm.  Pulmonary:     Effort: Pulmonary effort is normal. No respiratory distress.     Breath sounds: Normal breath sounds. No wheezing.  Abdominal:     General: Bowel sounds are normal. There is no distension.     Palpations: Abdomen is soft. There is no mass.     Tenderness: There is no abdominal tenderness. There is no guarding or rebound.  Musculoskeletal:        General: No tenderness. Normal range of motion.     Cervical back: Normal range of motion and neck supple.  Skin:    General: Skin is warm.  Neurological:     Mental Status: She is alert and oriented to person, place, and time.  Psychiatric:        Mood and Affect: Affect normal.     LABORATORY DATA:  I have reviewed the data as listed Lab Results  Component Value Date    WBC 5.0 07/12/2021   HGB 7.9 (L) 07/12/2021   HCT 25.8 (L) 07/12/2021   MCV 86.6 07/12/2021   PLT 244 07/12/2021   Recent Labs    08/11/20 0130 08/12/20 0409 10/16/20 1329 01/11/21 1317 01/25/21 1349 04/12/21 1450 04/14/21 1135 07/12/21 0900  NA 135 141 133* 134*   < > 133* 138 134*  K 5.0 4.9 5.1 4.6   < > 4.9 4.3 4.0  CL 106 110 105 102   < > 105 105 102  CO2 25 23 21* 24   < > _0 GLUCOSE 99 102* 195* 279*   < > 140* 165* 216*  BUN 33* 23 38* 34*   < > 32* 23 37*  CREATININE 2.15* 1.74* 1.94* 1.33*   < > 1.53* 1.39* 1.37*  CALCIUM 8.8* 8.9 9.2 9.7   < > 9.2 9.9 9.4  GFRNONAA 23* 30* 27* 42*   < > 35* 39* 40*  PROT 6.8  --  6.8 7.1  --   --   --   --   ALBUMIN 3.4*  --  3.6 3.7  --   --   --   --   AST 48* 50* 22 20  --   --   --   --   ALT 30  --  19 18  --   --   --   --   ALKPHOS 45  --  46 63  --   --   --   --   BILITOT 0.7  --  0.6 0.6  --   --   --   --   BILIDIR 0.1  --   --   --   --   --   --   --   IBILI 0.6  --   --   --   --   --   --   --    < > = values in this interval not displayed.     Intravitreal Injection, Pharmacologic Agent - OD - Right Eye  Result Date: 06/15/2021 Time Out 06/14/2021. 4:00 PM. Confirmed correct patient, procedure, site, and patient consented. Anesthesia Topical anesthesia was used. Anesthetic medications included Lidocaine 2%, Proparacaine 0.5%. Procedure Preparation included 5% betadine to ocular surface, eyelid speculum. A (32 g) needle was used. Injection: 2 mg aflibercept 2 MG/0.05ML   Route: Intravitreal, Site: Right Eye   NDC: [id number], Lot: [id number], Expiration date: 04/03/2022, Waste: 0 mL Post-op Post injection exam found visual acuity of at least counting fingers. The patient tolerated the procedure well. There were no complications. The patient received written and verbal post procedure care education. Post injection medications were not given.  OCT, Retina - OU - Both Eyes  Result Date: 06/15/2021 Right Eye  Quality was good. Central Foveal Thickness: 357. Progression has improved. Findings include no SRF, abnormal foveal contour, epiretinal membrane, intraretinal fluid (Mild interval improvement in IRF/cystic changes). Left Eye Quality was good. Central Foveal Thickness: 289. Progression has been stable. Findings include normal foveal contour, no IRF, no SRF (Trace ERM). Notes *Images captured and stored on drive Diagnosis / Impression: OD: BRVO w/ CME - Mild interval improvement in IRF/cystic changes OS: NFP; no IRF/SRF--stable, trace ERM Clinical management: See below Abbreviations: NFP - Normal foveal profile. CME - cystoid macular edema. PED - pigment epithelial detachment. IRF - intraretinal fluid. SRF - subretinal fluid. EZ - ellipsoid zone. ERM - epiretinal membrane. ORA - outer retinal atrophy. ORT - outer retinal tubulation. 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  Improved s/p IV iron infusions however today- Hb 7.9. awaiting iron studies today. Proceed with Venofer today.  # Discussed use of erythropoietin stimulating agents like retacrit to stimulate the bone marrow.  Discussed the potential issues with erythropoietin estimating agents-given the risk of stroke thromboembolic events/elevated blood pressure.  However, most of the serious events did not happen when the goal hematocrit is 33/hemoglobin 30.  Await repeat blood work in 6 weeks; if hemoglobin consistently low-recommend starting retacrit.  Also discussed re: bone marrow Biopsy; will hold off for now.   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   # Etiology-CKD-III; GFR ~35 overall -stable [Dr.Kolluru]; recommend continued hydration.  #Poor IV access/Mediport placement-stable  # DISPOSITION:  # Venofer today;  NO retacrit # venofer in 1 weeks # venofer in 2 weeks.  # follow up 6 weeks MD; port; labs- cbc/bmp; ;possible retacrit-venofer- Dr.B   All questions were answered. The patient knows to call the clinic with any problems, questions or concerns.    Cammie Sickle, MD 07/12/2021 11:49 AM

## 2021-07-13 ENCOUNTER — Telehealth: Payer: Self-pay | Admitting: *Deleted

## 2021-07-13 NOTE — Telephone Encounter (Signed)
Patient called requesting to go ahead and schedule the BMBx not wanting to wait to get it done. Please advise

## 2021-07-14 ENCOUNTER — Encounter: Payer: Self-pay | Admitting: Internal Medicine

## 2021-07-14 NOTE — Telephone Encounter (Signed)
Dr. B will call patient to discuss.

## 2021-07-14 NOTE — Progress Notes (Signed)
I spoke to patient regarding concerns for ongoing anemia.  Patient interested in bone marrow biopsy to further evaluate however given iron deficiency I would not recommend a bone marrow biopsy at this time.  Patient had eval/colonoscopy/EGD/camera work-up.  For now continue IV Venofer as ordered.  Patient in agreement.

## 2021-07-15 ENCOUNTER — Other Ambulatory Visit (INDEPENDENT_AMBULATORY_CARE_PROVIDER_SITE_OTHER): Payer: Self-pay | Admitting: Nurse Practitioner

## 2021-07-15 DIAGNOSIS — Z9889 Other specified postprocedural states: Secondary | ICD-10-CM

## 2021-07-16 ENCOUNTER — Ambulatory Visit (INDEPENDENT_AMBULATORY_CARE_PROVIDER_SITE_OTHER): Payer: Medicare Other | Admitting: Vascular Surgery

## 2021-07-16 ENCOUNTER — Encounter (INDEPENDENT_AMBULATORY_CARE_PROVIDER_SITE_OTHER): Payer: Self-pay | Admitting: Vascular Surgery

## 2021-07-16 ENCOUNTER — Ambulatory Visit (INDEPENDENT_AMBULATORY_CARE_PROVIDER_SITE_OTHER): Payer: Medicare Other

## 2021-07-16 VITALS — BP 150/60 | HR 85 | Resp 17 | Ht 65.0 in | Wt 165.8 lb

## 2021-07-16 DIAGNOSIS — E782 Mixed hyperlipidemia: Secondary | ICD-10-CM

## 2021-07-16 DIAGNOSIS — I739 Peripheral vascular disease, unspecified: Secondary | ICD-10-CM

## 2021-07-16 DIAGNOSIS — I70219 Atherosclerosis of native arteries of extremities with intermittent claudication, unspecified extremity: Secondary | ICD-10-CM | POA: Diagnosis not present

## 2021-07-16 DIAGNOSIS — Z9889 Other specified postprocedural states: Secondary | ICD-10-CM | POA: Diagnosis not present

## 2021-07-16 DIAGNOSIS — I6523 Occlusion and stenosis of bilateral carotid arteries: Secondary | ICD-10-CM

## 2021-07-16 DIAGNOSIS — E1159 Type 2 diabetes mellitus with other circulatory complications: Secondary | ICD-10-CM | POA: Diagnosis not present

## 2021-07-16 DIAGNOSIS — I10 Essential (primary) hypertension: Secondary | ICD-10-CM | POA: Diagnosis not present

## 2021-07-16 NOTE — Assessment & Plan Note (Signed)
blood pressure control important in reducing the progression of atherosclerotic disease. On appropriate oral medications.  

## 2021-07-16 NOTE — Assessment & Plan Note (Signed)
blood glucose control important in reducing the progression of atherosclerotic disease. Also, involved in wound healing. On appropriate medications.  

## 2021-07-16 NOTE — Progress Notes (Signed)
MRN : [id number]  Cheryl Leonard is a 76 y.o. ([birth year]) female who presents with chief complaint of No chief complaint on file. [name]  History of Present Illness: Patient returns today in follow up of her PAD.  She is doing well.  She feels well.  She had her fourth toe removed on the left foot and healed very well.  She has no disabling claudication, rest pain, or ulceration.  Her ABIs today are 1.07 on the right and 1.11 on the left with triphasic waveforms and normal digital pressures.  Current Outpatient Medications  Medication Sig Dispense Refill   ALPRAZolam (XANAX) 0.25 MG tablet Take 0.25 mg by mouth daily as needed for anxiety.     amLODipine (NORVASC) 5 MG tablet Take 5 mg by mouth 2 (two) times daily.     aspirin EC 81 MG tablet Take 1 tablet (81 mg total) by mouth daily. 30 tablet 11   cholecalciferol (VITAMIN D) 1000 units tablet Take 1,000 Units by mouth 2 (two) times daily.     CORAL CALCIUM PO Take 1 tablet by mouth daily.     denosumab (PROLIA) 60 MG/ML SOLN injection Inject 60 mg into the skin every 6 (six) months.      escitalopram (LEXAPRO) 10 MG tablet Take 10 mg by mouth daily.     estradiol (ESTRACE) 0.1 MG/GM vaginal cream Place 1 Applicatorful vaginally daily as needed (vaginal irritation).     famotidine (PEPCID) 40 MG tablet Take 40 mg by mouth daily.     furosemide (LASIX) 20 MG tablet Take 20 mg by mouth every Wednesday.     gabapentin (NEURONTIN) 300 MG capsule Take 300 mg by mouth at bedtime as needed (pain).     HYDROcodone-acetaminophen (NORCO/VICODIN) 5-325 MG tablet Take 1 tablet by mouth daily.     levothyroxine (SYNTHROID, LEVOTHROID) 100 MCG tablet Take 100 mcg by mouth daily before breakfast.   3   lidocaine-prilocaine (EMLA) cream Apply 1 application topically as needed (apply prior to port a cath access). 30 g 3   Magnesium 500 MG TABS Take 500 mg by mouth every morning.      metFORMIN (GLUCOPHAGE) 1000 MG tablet Take 1,000 mg by mouth 2 (two)  times daily with a meal.     metoprolol succinate (TOPROL-XL) 50 MG 24 hr tablet Take 50 mg by mouth daily. Take with or immediately following a meal.     mirtazapine (REMERON) 15 MG tablet Take 15 mg by mouth as needed.     Multiple Vitamin (MULTIVITAMIN WITH MINERALS) TABS tablet Take 1 tablet by mouth daily. Centrum Silver     nystatin cream (MYCOSTATIN) Apply 1 application topically daily as needed (Yeast infection).     olmesartan (BENICAR) 20 MG tablet Take 20 mg by mouth daily.     pantoprazole sodium (PROTONIX) 40 mg Take 40 mg by mouth every morning.      rivaroxaban (XARELTO) 20 MG TABS tablet Take 1 tablet (20 mg total) by mouth daily with supper. 90 tablet 3   rosuvastatin (CRESTOR) 20 MG tablet Take 20 mg by mouth every morning.     TRESIBA FLEXTOUCH 200 UNIT/ML SOPN Inject 25-30 Units as directed at bedtime. Titrate according to fasting blood glucose not to exceed 50 units a day  5   vitamin B-12 (CYANOCOBALAMIN) 1000 MCG tablet Take 1,000 mcg by mouth daily.     vitamin C (ASCORBIC ACID) 250 MG tablet Take 250 mg by mouth daily.  vitamin E 400 UNIT capsule Take 400 Units by mouth daily.     zolpidem (AMBIEN) 10 MG tablet Take 10 mg by mouth at bedtime.     icosapent Ethyl (VASCEPA) 1 g capsule Take 2 g by mouth 2 (two) times daily. (Patient not taking: Reported on 07/16/2021)     No current facility-administered medications for this visit.    Past Medical History:  Diagnosis Date   Anemia    Anxiety    Arthritis    Gout   Cataracts, both eyes    Diabetic retinopathy ([place])    NPDR OU   Diverticulitis    GERD (gastroesophageal reflux disease)    Gout    Headache    h/o migraines   History of fracture of patella    right knee   History of positive PPD    Patient always shows positive   Hyperlipidemia    Hypertension    Hypertensive retinopathy    OU   Hypothyroidism    Lichen sclerosus [id number]   of vulva   Metatarsal fracture    Neuropathy    Osteopenia     Peripheral vascular disease (HCC)    Polyneuropathy    numbness and tingling in feet and toes   Renal insufficiency    Stage 3   Sleep apnea    does not use cpap-lost weight    Type 2 diabetes mellitus, uncontrolled     Past Surgical History:  Procedure Laterality Date   ABDOMINAL HYSTERECTOMY     AMPUTATION TOE Right 05/08/2020   Procedure: AMPUTATION TOE-Right 4th Toe;  Surgeon: Caroline More, DPM;  Location: [facility];  Service: Podiatry;  Laterality: Right;   AMPUTATION TOE Left 04/22/2021   Procedure: AMPUTATION TOE - 4TH METARSOPHANGEAL JOINT;  Surgeon: Caroline More, DPM;  Location: [facility];  Service: Podiatry;  Laterality: Left;   APPENDECTOMY     BREAST REDUCTION SURGERY  2001   CATARACT EXTRACTION     CESAREAN SECTION  1976   COLONOSCOPY  03/05/2013   Nml - due for repeat 03/06/2018   COLONOSCOPY WITH PROPOFOL N/A 03/18/2019   Procedure: COLONOSCOPY WITH PROPOFOL;  Surgeon: Toledo, Benay Pike, MD;  Location: [facility];  Service: Gastroenterology;  Laterality: N/A;   DIAGNOSTIC LAPAROSCOPY     DILATION AND CURETTAGE OF UTERUS  1989   ENDARTERECTOMY FEMORAL Bilateral 03/09/2018   Procedure: ENDARTERECTOMY FEMORAL;  Surgeon: Algernon Huxley, MD;  Location: [facility];  Service: Vascular;  Laterality: Bilateral;   ENDARTERECTOMY POPLITEAL Left 03/09/2018   Procedure: ENDARTERECTOMY POPLITEAL AND SFA;  Surgeon: Algernon Huxley, MD;  Location: [facility];  Service: Vascular;  Laterality: Left;   ESOPHAGOGASTRODUODENOSCOPY  03/05/2013   ESOPHAGOGASTRODUODENOSCOPY (EGD) WITH PROPOFOL N/A 03/18/2019   Procedure: ESOPHAGOGASTRODUODENOSCOPY (EGD) WITH PROPOFOL;  Surgeon: Toledo, Benay Pike, MD;  Location: [facility];  Service: Gastroenterology;  Laterality: N/A;   EYE SURGERY     Eyelid Surgery  2012   INTRAMEDULLARY (IM) NAIL INTERTROCHANTERIC Left 10/30/2015   Procedure: INTRAMEDULLARY (IM) NAIL INTERTROCHANTRIC ;  Surgeon: Hessie Knows, MD;  Location: [facility];  Service: Orthopedics;   Laterality: Left;   KYPHOPLASTY N/A 10/25/2018   Procedure: L4 KYPHOPLASTY;  Surgeon: Hessie Knows, MD;  Location: [facility];  Service: Orthopedics;  Laterality: N/A;   LAPAROSCOPIC HYSTERECTOMY  2000   total   LOWER EXTREMITY ANGIOGRAPHY Left 03/08/2017   Procedure: LOWER EXTREMITY ANGIOGRAPHY;  Surgeon: Algernon Huxley, MD;  Location: [facility];  Service: Cardiovascular;  Laterality: Left;  LOWER EXTREMITY ANGIOGRAPHY Left 10/30/2017   Procedure: LOWER EXTREMITY ANGIOGRAPHY;  Surgeon: Algernon Huxley, MD;  Location: [facility];  Service: Cardiovascular;  Laterality: Left;   LOWER EXTREMITY ANGIOGRAPHY Right 03/08/2018   Procedure: LOWER EXTREMITY ANGIOGRAPHY;  Surgeon: Algernon Huxley, MD;  Location: [place] [facility];  Service: Cardiovascular;  Laterality: Right;   LOWER EXTREMITY ANGIOGRAPHY Left 10/01/2018   Procedure: LOWER EXTREMITY ANGIOGRAPHY;  Surgeon: Algernon Huxley, MD;  Location: [place] [facility];  Service: Cardiovascular;  Laterality: Left;   LOWER EXTREMITY ANGIOGRAPHY Right 10/08/2018   Procedure: LOWER EXTREMITY ANGIOGRAPHY;  Surgeon: Algernon Huxley, MD;  Location: [facility] [facility];  Service: Cardiovascular;  Laterality: Right;   LOWER EXTREMITY ANGIOGRAPHY Right 05/07/2020   Procedure: Lower Extremity Angiography;  Surgeon: Algernon Huxley, MD;  Location: [facility];  Service: Cardiovascular;  Laterality: Right;   LYSIS OF ADHESION  01/25/2021   Procedure: LYSIS OF ADHESION;  Surgeon: Ronny Bacon, MD;  Location: [facility];  Service: General;;   PERIPHERAL VASCULAR INTERVENTION  03/08/2018   Procedure: PERIPHERAL VASCULAR INTERVENTION;  Surgeon: Algernon Huxley, MD;  Location: [facility];  Service: Cardiovascular;;   PORTA CATH INSERTION N/A 02/17/2020   Procedure: PORTA CATH INSERTION;  Surgeon: Algernon Huxley, MD;  Location: [place] [facility];  Service: Cardiovascular;  Laterality: N/A;   REDUCTION MAMMAPLASTY  1997   SACROPLASTY N/A 10/25/2018    Procedure: S1 SACROPLASTY;  Surgeon: Hessie Knows, MD;  Location: [facility];  Service: Orthopedics;  Laterality: N/A;     Social History   Tobacco Use   Smoking status: Former    Packs/day: 1.00    Years: 20.00    Total pack years: 20.00    Types: Cigarettes    Quit date: 03/07/1996    Years since quitting: 25.3   Smokeless tobacco: Never   Tobacco comments:    started smoking at age 44 but stopped smoking in 2000  Vaping Use   Vaping Use: Never used  Substance Use Topics   Alcohol use: No    Alcohol/week: 0.0 standard drinks of alcohol   Drug use: No      Family History  Problem Relation Age of Onset   Coronary artery disease Father    Heart attack Father    Coronary artery disease Mother    Heart attack Mother    Ovarian cancer Sister 66       sister had hormonal therapy for IVF txs-which increased risk factor for ovarian cancer   Breast cancer Neg Hx      No Known Allergies   REVIEW OF SYSTEMS (Negative unless checked)  Constitutional: [] Weight loss  [] Fever  [] Chills Cardiac: [] Chest pain   [] Chest pressure   [] Palpitations   [] Shortness of breath when laying flat   [] Shortness of breath at rest   [] Shortness of breath with exertion. Vascular:  [] Pain in legs with walking   [] Pain in legs at rest   [] Pain in legs when laying flat   [] Claudication   [] Pain in feet when walking  [] Pain in feet at rest  [] Pain in feet when laying flat   [] History of DVT   [] Phlebitis   [] Swelling in legs   [] Varicose veins   [] Non-healing ulcers Pulmonary:   [] Uses home oxygen   [] Productive cough   [] Hemoptysis   [] Wheeze  [] COPD   [] Asthma Neurologic:  [] Dizziness  [] Blackouts   [] Seizures   [] History of stroke   []   History of TIA  [] Aphasia   [] Temporary blindness   [] Dysphagia   [] Weakness or numbness in arms   [] Weakness or numbness in legs Musculoskeletal:  [x] Arthritis   [] Joint swelling   [x] Joint pain   [] Low back pain Hematologic:  [] Easy bruising  [] Easy bleeding    [] Hypercoagulable state   [x] Anemic   Gastrointestinal:  [] Blood in stool   [] Vomiting blood  [] Gastroesophageal reflux/heartburn   [] Abdominal pain Genitourinary:  [] Chronic kidney disease   [] Difficult urination  [] Frequent urination  [] Burning with urination   [] Hematuria Skin:  [] Rashes   [x] Ulcers   [] Wounds Psychological:  [] History of anxiety   []  History of major depression.  Physical Examination  BP (!) 150/60 (BP Location: Left Arm)   Pulse 85   Resp 17   Ht 5\' 5"  (1.651 m)   Wt 165 lb 12.8 oz (75.2 kg)   BMI 27.59 kg/m  Gen:  WD/WN, NAD Head: [place]/AT, No temporalis wasting. Ear/Nose/Throat: Hearing grossly intact, nares w/o erythema or drainage Eyes: Conjunctiva clear. Sclera non-icteric Neck: Supple.  Trachea midline Pulmonary:  Good air movement, no use of accessory muscles.  Cardiac: RRR, no JVD Vascular:  Vessel Right Left  Radial Palpable Palpable                          PT Palpable Palpable  DP Palpable Palpable   Gastrointestinal: soft, non-tender/non-distended. No guarding/reflex.  Musculoskeletal: M/S 5/5 throughout.  No deformity or atrophy. No edema. Neurologic: Sensation grossly intact in extremities.  Symmetrical.  Speech is fluent.  Psychiatric: Judgment intact, Mood & affect appropriate for pt's clinical situation. Dermatologic: No rashes or ulcers noted.  No cellulitis or open wounds.      Labs Recent Results (from the past 2160 hour(s))  Glucose, capillary     Status: Abnormal   Collection Time: 04/22/21 10:23 AM  Result Value Ref Range   Glucose-Capillary 165 (H) 70 - 99 mg/dL    Comment: Glucose reference range applies only to samples taken after fasting for at least 8 hours.  Anaerobic culture w Gram Stain     Status: None   Collection Time: 04/22/21 10:53 AM   Specimen: PATH Other; Tissue  Result Value Ref Range   Specimen Description      BONE LEFT 4TH TOE Performed at [facility], [address] [place], [place]  [address]    Special Requests      NONE Performed at [facility], [place], [place] [address]    Gram Stain NO WBC SEEN NO ORGANISMS SEEN     Culture      NO ANAEROBES ISOLATED Performed at [facility], [place] [address], [place],  [address]    Report Status 04/27/2021 FINAL   Surgical pathology     Status: None   Collection Time: 04/22/21 11:25 AM  Result Value Ref Range   SURGICAL PATHOLOGY      SURGICAL PATHOLOGY CASE: [id number] PATIENT: [facility] Surgical Pathology Report     Specimen Submitted: A. 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      DIAGNOSIS: A. TOE, LEFT FOURTH; AMPUTATION: - SOFT TISSUE ULCERATION WITH UNDERLYING ACUTE OSTEOMYELITIS. - PROXIMAL MARGIN WITH VIABLE SOFT TISSUE AND ARTICULAR SURFACE OF BONE;  NEGATIVE FOR ACUTE OSTEOMYELITIS.  GROSS DESCRIPTION: A. Labeled: Left fourth toe, proximal margin colored purple Received: Fresh Collection time: 11:25 AM on 04/22/2021 Placed into formalin time: 12:09 PM on 04/22/2021 Size: 4.0 x 2.3 x 1.5 cm Description of lesion(s): There is a 0.9 x 0.6 cm ulcer on the skin, 0.1 cm from the closest proximal margin Proximal margin: The proximal mar gins are received with purple ink and are over inked blue.  The bone at the proximal margin has a tan, smooth and glistening articular surface.  The skin and soft tissue is tan and grossly viable Bone: The bone underlying the ulcer is yellow and partially softened Other findings: None noted  Block summary: 1 -perpendicular sections of bone margin 2-cross-section of ulcer with underlying bone and closest skin and soft tissue margin  Tissue decalcification: Cassettes 1 and 2  CM  04/22/2021  Final Diagnosis performed by Elijah Birk, MD.   Electronically signed 04/26/2021 12:52:49PM The electronic signature indicates that the named Attending Pathologist has evaluated the specimen Technical component performed at [facility], [address], [place], [place] [address] Lab: [phone] Dir: Jolene Schimke, MD, MMM  Professional component performed at [facility], [address] [place], [place], [place] [address] Lab: [phone] Dir: Beryle Quant, MD    Glucose, capillary     Status: Abnormal   Collection Time: 04/22/21 12:09 PM  Result Value Ref Range   Glucose-Capillary 158 (H) 70 - 99 mg/dL    Comment: Glucose reference range applies only to samples taken after fasting for at least 8 hours.  Ferritin     Status: Abnormal   Collection Time: 07/12/21  9:00 AM  Result Value Ref Range   Ferritin 8 (L) 11 - 307 ng/mL    Comment: Performed at [facility], [address] [place], [place] [address]  Iron and TIBC     Status: None   Collection Time: 07/12/21  9:00 AM  Result Value Ref Range   Iron 58 28 - 170 ug/dL   TIBC 295 188 - 416 ug/dL   Saturation Ratios 15 10.4 - 31.8 %   UIBC 324 ug/dL    Comment: Performed at [facility], [address] [place], [place] [address]  Basic metabolic panel     Status: Abnormal   Collection Time: 07/12/21  9:00 AM  Result Value Ref Range   Sodium 134 (L) 135 - 145 mmol/L   Potassium 4.0 3.5 - 5.1 mmol/L   Chloride 102 98 - 111 mmol/L   CO2 22 22 - 32 mmol/L   Glucose, Bld 216 (H) 70 - 99 mg/dL    Comment: Glucose reference range applies only to samples taken after fasting for at least 8 hours.   BUN 37 (H) 8 - 23 mg/dL   Creatinine, Ser 1.60 (H) 0.44 - 1.00 mg/dL   Calcium 9.4 8.9 - 10.9 mg/dL   GFR, Estimated 40 (L) >60 mL/min    Comment: (NOTE) Calculated using the CKD-EPI Creatinine Equation (2021)    Anion gap 10 5 - 15    Comment: Performed at [facility], [address] [place], [place] [address]  CBC with Differential/Platelet     Status: Abnormal   Collection Time: 07/12/21  9:00 AM  Result Value Ref Range   WBC 5.0 4.0 - 10.5 K/uL   RBC 2.98 (L) 3.87 - 5.11 MIL/uL   Hemoglobin 7.9 (L) 12.0 - 15.0 g/dL   HCT 73.2 (L) 20.2 - 54.2 %  MCV 86.6 80.0 - 100.0 fL   MCH 26.5 26.0 - 34.0 pg   MCHC 30.6 30.0 - 36.0 g/dL   RDW 14.6 11.5 - 15.5 %   Platelets 244 150 - 400 K/uL   nRBC 0.0 0.0 - 0.2 %   Neutrophils Relative % 68 %   Neutro Abs 3.4 1.7 - 7.7 K/uL   Lymphocytes Relative 18 %   Lymphs Abs 0.9 0.7 - 4.0 K/uL   Monocytes Relative 10 %   Monocytes Absolute 0.5 0.1 - 1.0 K/uL   Eosinophils Relative 3 %   Eosinophils Absolute 0.2 0.0 - 0.5 K/uL   Basophils Relative 1 %   Basophils Absolute 0.0 0.0 - 0.1 K/uL   Immature Granulocytes 0 %   Abs Immature Granulocytes 0.01 0.00 - 0.07 K/uL    Comment: Performed at [facility], [address] [place], [place] [address]    Radiology Intravitreal Injection, Pharmacologic Agent - OD - Right Eye  Result Date: 07/12/2021 Time Out 07/12/2021. 3:48 PM. Confirmed correct patient, procedure, site, and patient consented. Anesthesia Topical anesthesia was used. Anesthetic medications included Lidocaine 2%, Proparacaine 0.5%. Procedure Preparation included 5% betadine to ocular surface, eyelid speculum. A (32 g) needle was used. Injection: 2 mg aflibercept 2 MG/0.05ML   Route: Intravitreal, Site: Right Eye   NDC: [id number], Lot: [id number], Expiration date: 04/02/2022, Waste: 0 mL Post-op Post injection exam found visual acuity of at least counting fingers. The patient tolerated the procedure well. There were no complications. The patient received written and verbal post procedure care education. Post injection medications were not given.   OCT, Retina - OU - Both Eyes  Result Date: 07/12/2021 Right Eye Quality was good. Central Foveal Thickness: 392. Progression has worsened. Findings include  no SRF, abnormal foveal contour, epiretinal membrane, intraretinal fluid (Interval increase in IRF/central edema). Left Eye Quality was good. Central Foveal Thickness: 285. Progression has been stable. Findings include normal foveal contour, no IRF, no SRF (Trace ERM). Notes *Images captured and stored on drive Diagnosis / Impression: OD: BRVO w/ CME - Interval increase in IRF/cystic changes OS: NFP; no IRF/SRF--stable, trace ERM Clinical management: See below Abbreviations: NFP - Normal foveal profile. CME - cystoid macular edema. PED - pigment epithelial detachment. IRF - intraretinal fluid. SRF - subretinal fluid. EZ - ellipsoid zone. ERM - epiretinal membrane. ORA - outer retinal atrophy. ORT - outer retinal tubulation. SRHM - subretinal hyper-reflective material    Assessment/Plan  Type 2 diabetes mellitus with vascular disease (HCC) blood glucose control important in reducing the progression of atherosclerotic disease. Also, involved in wound healing. On appropriate medications.   Benign essential hypertension blood pressure control important in reducing the progression of atherosclerotic disease. On appropriate oral medications.   Hyperlipidemia, mixed lipid control important in reducing the progression of atherosclerotic disease. Continue statin therapy   Carotid atherosclerosis, bilateral Mild at last check a few years ago.  We will recheck with her next visit.  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  Continue current medical regimen.  Recheck in 6 months.    Leotis Pain, MD  07/16/2021 11:50 AM    This note was created with Dragon medical transcription system.  Any errors from dictation are purely unintentional

## 2021-07-16 NOTE — Assessment & Plan Note (Signed)
lipid control important in reducing the progression of atherosclerotic disease. Continue statin therapy  

## 2021-07-16 NOTE — Assessment & Plan Note (Signed)
Mild at last check a few years ago.  We will recheck with her next visit.

## 2021-07-16 NOTE — Assessment & Plan Note (Signed)
Her ABIs today are 1.07 on the right and 1.11 on the left with triphasic waveforms and normal digital pressures.  Continue current medical regimen.  Recheck in 6 months.

## 2021-07-19 ENCOUNTER — Inpatient Hospital Stay: Payer: Medicare Other

## 2021-07-19 VITALS — BP 118/43 | HR 79 | Temp 98.4°F | Resp 16

## 2021-07-19 DIAGNOSIS — D631 Anemia in chronic kidney disease: Secondary | ICD-10-CM

## 2021-07-19 DIAGNOSIS — N183 Chronic kidney disease, stage 3 unspecified: Secondary | ICD-10-CM

## 2021-07-19 DIAGNOSIS — D509 Iron deficiency anemia, unspecified: Secondary | ICD-10-CM | POA: Diagnosis not present

## 2021-07-19 MED ORDER — HEPARIN SOD (PORK) LOCK FLUSH 100 UNIT/ML IV SOLN
500.0000 [IU] | Freq: Once | INTRAVENOUS | Status: AC | PRN
  Administered 2021-07-19: 500 [IU]
  Filled 2021-07-19: qty 5

## 2021-07-19 MED ORDER — SODIUM CHLORIDE 0.9 % IV SOLN
Freq: Once | INTRAVENOUS | Status: AC
  Filled 2021-07-19: qty 250

## 2021-07-19 MED ORDER — SODIUM CHLORIDE 0.9 % IV SOLN
200.0000 mg | Freq: Once | INTRAVENOUS | Status: AC
  Administered 2021-07-19: 200 mg via INTRAVENOUS
  Filled 2021-07-19: qty 10

## 2021-07-19 MED ORDER — SODIUM CHLORIDE 0.9% FLUSH
10.0000 mL | Freq: Once | INTRAVENOUS | Status: AC | PRN
  Administered 2021-07-19: 10 mL
  Filled 2021-07-19: qty 10

## 2021-07-19 NOTE — Patient Instructions (Signed)

## 2021-07-23 MED FILL — Iron Sucrose Inj 20 MG/ML (Fe Equiv): INTRAVENOUS | Qty: 10 | Status: AC

## 2021-07-26 ENCOUNTER — Inpatient Hospital Stay: Payer: Medicare Other

## 2021-07-26 VITALS — BP 120/45 | HR 72 | Temp 98.3°F | Resp 18

## 2021-07-26 DIAGNOSIS — D631 Anemia in chronic kidney disease: Secondary | ICD-10-CM

## 2021-07-26 DIAGNOSIS — D509 Iron deficiency anemia, unspecified: Secondary | ICD-10-CM | POA: Diagnosis not present

## 2021-07-26 MED ORDER — SODIUM CHLORIDE 0.9 % IV SOLN
200.0000 mg | Freq: Once | INTRAVENOUS | Status: AC
  Administered 2021-07-26: 200 mg via INTRAVENOUS
  Filled 2021-07-26: qty 200

## 2021-07-26 MED ORDER — HEPARIN SOD (PORK) LOCK FLUSH 100 UNIT/ML IV SOLN
INTRAVENOUS | Status: AC
  Filled 2021-07-26: qty 5

## 2021-07-26 MED ORDER — SODIUM CHLORIDE 0.9 % IV SOLN
Freq: Once | INTRAVENOUS | Status: AC
  Filled 2021-07-26: qty 250

## 2021-08-03 NOTE — Progress Notes (Signed)
[facility] Note  08/09/2021     CHIEF COMPLAINT Patient presents for Retina Follow Up   HISTORY OF PRESENT ILLNESS: Cheryl Leonard is a 76 y.o. female who presents to the clinic today for:   HPI     Retina Follow Up   Patient presents with  CRVO/BRVO (IVE OD 06.12.23).  In right eye.  This started months ago.  Severity is moderate.  Duration of 4 weeks.  Since onset it is stable.  I, the attending physician,  performed the HPI with the patient and updated documentation appropriately.        Comments   Patient feels that the vision is the same since her last appointment. Her blood sugar was 124 and her A1C is 7.0      Last edited by Bernarda Caffey, MD on 08/09/2021  5:04 PM.    Pt states vision is the same   Referring physician: Rusty Aus, [place],  [place] [id number]  HISTORICAL INFORMATION:   Selected notes from the MEDICAL RECORD NUMBER Referred by Dr. Marvel Plan for concern of DME OD Lab Results  Component Value Date   HGBA1C 7.3 (H) 01/11/2021     CURRENT MEDICATIONS: No current outpatient medications on file. (Ophthalmic Drugs)   No current facility-administered medications for this visit. (Ophthalmic Drugs)   Current Outpatient Medications (Other)  Medication Sig   ALPRAZolam (XANAX) 0.25 MG tablet Take 0.25 mg by mouth daily as needed for anxiety.   amLODipine (NORVASC) 5 MG tablet Take 5 mg by mouth 2 (two) times daily.   aspirin EC 81 MG tablet Take 1 tablet (81 mg total) by mouth daily.   cholecalciferol (VITAMIN D) 1000 units tablet Take 1,000 Units by mouth 2 (two) times daily.   CORAL CALCIUM PO Take 1 tablet by mouth daily.   denosumab (PROLIA) 60 MG/ML SOLN injection Inject 60 mg into the skin every 6 (six) months.    escitalopram (LEXAPRO) 10 MG tablet Take 10 mg by mouth daily.   estradiol (ESTRACE) 0.1 MG/GM vaginal cream Place 1 Applicatorful vaginally  daily as needed (vaginal irritation).   famotidine (PEPCID) 40 MG tablet Take 40 mg by mouth daily.   furosemide (LASIX) 20 MG tablet Take 20 mg by mouth every Wednesday.   gabapentin (NEURONTIN) 300 MG capsule Take 300 mg by mouth at bedtime as needed (pain).   HYDROcodone-acetaminophen (NORCO/VICODIN) 5-325 MG tablet Take 1 tablet by mouth daily.   icosapent Ethyl (VASCEPA) 1 g capsule Take 2 g by mouth 2 (two) times daily. (Patient not taking: Reported on 07/16/2021)   levothyroxine (SYNTHROID, LEVOTHROID) 100 MCG tablet Take 100 mcg by mouth daily before breakfast.    lidocaine-prilocaine (EMLA) cream Apply 1 application topically as needed (apply prior to port a cath access).   Magnesium 500 MG TABS Take 500 mg by mouth every morning.    metFORMIN (GLUCOPHAGE) 1000 MG tablet Take 1,000 mg by mouth 2 (two) times daily with a meal.   metoprolol succinate (TOPROL-XL) 50 MG 24 hr tablet Take 50 mg by mouth daily. Take with or immediately following a meal.   mirtazapine (REMERON) 15 MG tablet Take 15 mg by mouth as needed.   Multiple Vitamin (MULTIVITAMIN WITH MINERALS) TABS tablet Take 1 tablet by mouth daily. Centrum Silver   nystatin cream (MYCOSTATIN) Apply 1 application topically daily as needed (Yeast infection).   olmesartan (BENICAR) 20 MG tablet Take 20  mg by mouth daily.   pantoprazole sodium (PROTONIX) 40 mg Take 40 mg by mouth every morning.    rivaroxaban (XARELTO) 20 MG TABS tablet Take 1 tablet (20 mg total) by mouth daily with supper.   rosuvastatin (CRESTOR) 20 MG tablet Take 20 mg by mouth every morning.   TRESIBA FLEXTOUCH 200 UNIT/ML SOPN Inject 25-30 Units as directed at bedtime. Titrate according to fasting blood glucose not to exceed 50 units a day   vitamin B-12 (CYANOCOBALAMIN) 1000 MCG tablet Take 1,000 mcg by mouth daily.   vitamin C (ASCORBIC ACID) 250 MG tablet Take 250 mg by mouth daily.   vitamin E 400 UNIT capsule Take 400 Units by mouth daily.   zolpidem (AMBIEN)  10 MG tablet Take 10 mg by mouth at bedtime.   No current facility-administered medications for this visit. (Other)   REVIEW OF SYSTEMS: ROS   Positive for: Gastrointestinal, Musculoskeletal, Endocrine, Cardiovascular, Eyes, Respiratory Negative for: Constitutional, Neurological, Skin, Genitourinary, HENT, Psychiatric, Allergic/Imm, Heme/Lymph Last edited by Julieanne Cotton, COT on 08/09/2021  1:47 PM.     ALLERGIES No Known Allergies  PAST MEDICAL HISTORY Past Medical History:  Diagnosis Date   Anemia    Anxiety    Arthritis    Gout   Cataracts, both eyes    Diabetic retinopathy (HCC)    NPDR OU   Diverticulitis    GERD (gastroesophageal reflux disease)    Gout    Headache    h/o migraines   History of fracture of patella    right knee   History of positive PPD    Patient always shows positive   Hyperlipidemia    Hypertension    Hypertensive retinopathy    OU   Hypothyroidism    Lichen sclerosus 12/30/2013   of vulva   Metatarsal fracture    Neuropathy    Osteopenia    Peripheral vascular disease (HCC)    Polyneuropathy    numbness and tingling in feet and toes   Renal insufficiency    Stage 3   Sleep apnea    does not use cpap-lost weight    Type 2 diabetes mellitus, uncontrolled    Past Surgical History:  Procedure Laterality Date   ABDOMINAL HYSTERECTOMY     AMPUTATION TOE Right 05/08/2020   Procedure: AMPUTATION TOE-Right 4th Toe;  Surgeon: Rosetta Posner, DPM;  Location: [facility];  Service: Podiatry;  Laterality: Right;   AMPUTATION TOE Left 04/22/2021   Procedure: AMPUTATION TOE - 4TH METARSOPHANGEAL JOINT;  Surgeon: Rosetta Posner, DPM;  Location: [facility];  Service: Podiatry;  Laterality: Left;   APPENDECTOMY     BREAST REDUCTION SURGERY  2001   CATARACT EXTRACTION     CESAREAN SECTION  1976   COLONOSCOPY  03/05/2013   Nml - due for repeat 03/06/2018   COLONOSCOPY WITH PROPOFOL N/A 03/18/2019   Procedure: COLONOSCOPY WITH PROPOFOL;  Surgeon:  Toledo, [name], MD;  Location: [facility];  Service: Gastroenterology;  Laterality: N/A;   DIAGNOSTIC LAPAROSCOPY     DILATION AND CURETTAGE OF UTERUS  1989   ENDARTERECTOMY FEMORAL Bilateral 03/09/2018   Procedure: ENDARTERECTOMY FEMORAL;  Surgeon: Annice Needy, MD;  Location: [facility];  Service: Vascular;  Laterality: Bilateral;   ENDARTERECTOMY POPLITEAL Left 03/09/2018   Procedure: ENDARTERECTOMY POPLITEAL AND SFA;  Surgeon: Annice Needy, MD;  Location: [facility];  Service: Vascular;  Laterality: Left;   ESOPHAGOGASTRODUODENOSCOPY  03/05/2013   ESOPHAGOGASTRODUODENOSCOPY (EGD) WITH PROPOFOL N/A 03/18/2019   Procedure:  ESOPHAGOGASTRODUODENOSCOPY (EGD) WITH PROPOFOL;  Surgeon: Toledo, [name], MD;  Location: [facility];  Service: Gastroenterology;  Laterality: N/A;   EYE SURGERY     Eyelid Surgery  2012   INTRAMEDULLARY (IM) NAIL INTERTROCHANTERIC Left 10/30/2015   Procedure: INTRAMEDULLARY (IM) NAIL INTERTROCHANTRIC ;  Surgeon: Kennedy Bucker, MD;  Location: [facility];  Service: Orthopedics;  Laterality: Left;   KYPHOPLASTY N/A 10/25/2018   Procedure: L4 KYPHOPLASTY;  Surgeon: Kennedy Bucker, MD;  Location: [facility];  Service: Orthopedics;  Laterality: N/A;   LAPAROSCOPIC HYSTERECTOMY  2000   total   LOWER EXTREMITY ANGIOGRAPHY Left 03/08/2017   Procedure: LOWER EXTREMITY ANGIOGRAPHY;  Surgeon: Annice Needy, MD;  Location: [facility];  Service: Cardiovascular;  Laterality: Left;   LOWER EXTREMITY ANGIOGRAPHY Left 10/30/2017   Procedure: LOWER EXTREMITY ANGIOGRAPHY;  Surgeon: Annice Needy, MD;  Location: [facility];  Service: Cardiovascular;  Laterality: Left;   LOWER EXTREMITY ANGIOGRAPHY Right 03/08/2018   Procedure: LOWER EXTREMITY ANGIOGRAPHY;  Surgeon: Annice Needy, MD;  Location: [facility];  Service: Cardiovascular;  Laterality: Right;   LOWER EXTREMITY ANGIOGRAPHY Left 10/01/2018   Procedure: LOWER EXTREMITY ANGIOGRAPHY;  Surgeon: Annice Needy, MD;   Location: [facility];  Service: Cardiovascular;  Laterality: Left;   LOWER EXTREMITY ANGIOGRAPHY Right 10/08/2018   Procedure: LOWER EXTREMITY ANGIOGRAPHY;  Surgeon: Annice Needy, MD;  Location: [facility];  Service: Cardiovascular;  Laterality: Right;   LOWER EXTREMITY ANGIOGRAPHY Right 05/07/2020   Procedure: Lower Extremity Angiography;  Surgeon: Annice Needy, MD;  Location: [facility];  Service: Cardiovascular;  Laterality: Right;   LYSIS OF ADHESION  01/25/2021   Procedure: LYSIS OF ADHESION;  Surgeon: Campbell Lerner, MD;  Location: [facility];  Service: General;;   PERIPHERAL VASCULAR INTERVENTION  03/08/2018   Procedure: PERIPHERAL VASCULAR INTERVENTION;  Surgeon: Annice Needy, MD;  Location: [facility];  Service: Cardiovascular;;   PORTA CATH INSERTION N/A 02/17/2020   Procedure: PORTA CATH INSERTION;  Surgeon: Annice Needy, MD;  Location: [facility];  Service: Cardiovascular;  Laterality: N/A;   REDUCTION MAMMAPLASTY  1997   SACROPLASTY N/A 10/25/2018   Procedure: S1 SACROPLASTY;  Surgeon: Kennedy Bucker, MD;  Location: [facility];  Service: Orthopedics;  Laterality: N/A;   FAMILY HISTORY Family History  Problem Relation Age of Onset   Coronary artery disease Father    Heart attack Father    Coronary artery disease Mother    Heart attack Mother    Ovarian cancer Sister 73       sister had hormonal therapy for IVF txs-which increased risk factor for ovarian cancer   Breast cancer Neg Hx    SOCIAL HISTORY Social History   Tobacco Use   Smoking status: Former    Packs/day: 1.00    Years: 20.00    Total pack years: 20.00    Types: Cigarettes    Quit date: 03/07/1996    Years since quitting: 25.4   Smokeless tobacco: Never   Tobacco comments:    started smoking at age 84 but stopped smoking in 2000  Vaping Use   Vaping Use: Never used  Substance Use Topics   Alcohol use: No    Alcohol/week: 0.0 standard drinks of alcohol   Drug  use: No       OPHTHALMIC EXAM: Base Eye Exam     Visual Acuity (Snellen - Linear)  Right Left   Dist cc 20/40 20/20   Dist ph cc NI          Tonometry (Tonopen, 1:52 PM)       Right Left   Pressure 17 15         Pupils       Dark Light Shape React APD   Right 3 2 Round Brisk None   Left 3 2 Round Brisk None         Visual Fields       Left Right    Full Full         Extraocular Movement       Right Left    Full, Ortho Full, Ortho         Neuro/Psych     Oriented x3: Yes   Mood/Affect: Normal         Dilation     Both eyes: 1.0% Mydriacyl, 2.5% Phenylephrine @ 1:48 PM           Slit Lamp and Fundus Exam     External Exam       Right Left   External Normal Normal         Slit Lamp Exam       Right Left   Lids/Lashes dermatochalasis dermatochalasis   Conjunctiva/Sclera White and quiet White and quiet   Cornea arcus; well healed cataract wound; 2-3+ diffuse Punctate epithelial erosions, decreased TBUT, mild Anterior basement membrane dystrophy superiorly arcus; well healed cataract wound, 2-3+ diffuse Punctate epithelial erosions, irregualr epi surface, decreased TBUT   Anterior Chamber Deep and quiet Deep and quiet   Iris Round and dilated Round and dilated   Lens PCIOL; open PC PCIOL; open PC   Anterior Vitreous syneresis, Posterior vitreous detachment, vitreous condensations inferiorly syneresis, Posterior vitreous detachment         Fundus Exam       Right Left   Disc Superior hyperemia, mild Pallor Pink and Sharp   C/D Ratio 0.6 0.5   Macula Flat, Blunted foveal reflex, trace cystic changes - slightly increased, +Epiretinal membrane, minimal MA, PED flat; good foveal reflex, no heme or edema, small pigment clump IT to fovea   Vessels attenuated, Tortuous attenuated, Tortuous   Periphery Attached; 3 focal DBH temporal periphery Attached, no heme           IMAGING AND PROCEDURES  Imaging and Procedures for  04/25/17  OCT, Retina - OU - Both Eyes       Right Eye Quality was good. Central Foveal Thickness: 412. Progression has worsened. Findings include no SRF, abnormal foveal contour, epiretinal membrane, intraretinal fluid (Persistent IRF/central edema -- slightly increased).   Left Eye Quality was good. Central Foveal Thickness: 284. Progression has been stable. Findings include normal foveal contour, no IRF, no SRF (Trace ERM).   Notes *Images captured and stored on drive  Diagnosis / Impression:  OD: BRVO w/ CME - Persistent IRF/central edema -- slightly increased OS: NFP; no IRF/SRF--stable, trace ERM  Clinical management:  See below  Abbreviations: NFP - Normal foveal profile. CME - cystoid macular edema. PED - pigment epithelial detachment. IRF - intraretinal fluid. SRF - subretinal fluid. EZ - ellipsoid zone. ERM - epiretinal membrane. ORA - outer retinal atrophy. ORT - outer retinal tubulation. SRHM - subretinal hyper-reflective material      Intravitreal Injection, Pharmacologic Agent - OD - Right Eye       Time Out 08/09/2021. 3:35 PM. Confirmed correct patient, procedure,  site, and patient consented.   Anesthesia Topical anesthesia was used. Anesthetic medications included Lidocaine 2%, Proparacaine 0.5%.   Procedure Preparation included 5% betadine to ocular surface, eyelid speculum. A (32 g) needle was used.   Injection: 2 mg aflibercept 2 MG/0.05ML   Route: Intravitreal, Site: Right Eye   NDC: [id number], Lot: [id number], Expiration date: 10/03/2022, Waste: 0 mL   Post-op Post injection exam found visual acuity of at least counting fingers. The patient tolerated the procedure well. There were no complications. The patient received written and verbal post procedure care education. Post injection medications were not given.            ASSESSMENT/PLAN:    ICD-10-CM   1. Branch retinal vein occlusion of right eye with macular edema  H34.8310 OCT, Retina -  OU - Both Eyes    Intravitreal Injection, Pharmacologic Agent - OD - Right Eye    aflibercept (EYLEA) SOLN 2 mg    2. Both eyes affected by mild nonproliferative diabetic retinopathy with macular edema, associated with type 2 diabetes mellitus ([place])  [id number]     3. Essential hypertension  I10     4. Hypertensive retinopathy of both eyes  H35.033     5. Epiretinal membrane (ERM) of right eye  H35.371     6. Pseudophakia of both eyes  Z96.1      1. BRVO w/ CME OD  - by history, pt states symptoms first noticed 2 wks prior to presentation, but reports changes may have occurred prior  - initial exam with differential tortuosity of vessels (OD > OS)  - FA (02.10.20) shows mild late staining / leakage in macula, staining / leakage of disc -- improving CME  - differential includes DM2 (DME), hypertensive retinopathy, inflammatory etiology / uveitis  - S/P IVA OD #1 (02.08.19), #2 (03.11.19), #3 (04.09.19), #4 (05.20.19), #5 (02.10.20)  - gave IVA OD on 2.10.20 due to pending Eylea4U for 2020 -- resulted in increased IRF/CME  - review of OCTs show persistent IRF and cystic changes --  resistance to IVA   - June 2019 -- switched therapies: S/P IVE OD #1 (06.24.19), #2 (07.24.19), #3 (09.04.19), #4 (10.30.19),#5 (12.30.19), #6 (03.23.20), #7 (05.05.20), #8 (07.16.20), #9 (07.17.20), #10 (08.28.20), #11 (10.13.20), # 12 (11.17.20), #13 (2.8.21), #14 (03.09.21), #15 (04.13.21), #16 (05.11.21), #17 (06.17.21), #18 (07.23.21), #19 (08.30.21), #20 (10.04.21), #21 (11.08.21), #22 (12.08.21), #23 (01.31.22), #24 (02.28.22), #25 (04.01.22), #26 (06.15.22), #27 (07.13.22), #28 (08.17.22), #29 (09.21.22), #30 (10.19.22), #31 (11.16.22), #32 (12.16.22), #33 (01.13.23), #34 (02.10.23), #35 (03.15.23), #36 (04.14.23), #37 (05.15.23), #38 (06.12.23), #39 (07.10.23)  - OCT today shows persistent IRF/central edema -- slightly increased at 4 weeks  - BCVA 20/40 -- stable  - Eylea4U benefits investigation completed  and pt approved for IVE for 2023  - recommend IVE OD #40 today, 08.07.23 w/ f/u in 4 wks  - RBA of procedure discussed, questions answered  - informed consent obtained  - see procedure note  - Eylea informed consent form obtained, re-signed and scanned on 05.15.23  - f/u 4 weeks  -- DFE/OCT/possible injection  2. Mild nonproliferative diabetic retinopathy, both eyes  - A1c 6.6 on 07.01.23, 6.0 on 10.14.22  - could be contributing to CME OD  - OS with minimal diabetic retinopathy  - continue to monitor  3,4. Hypertensive retinopathy OU - stable  - as above, may have contributing to CME OD  - discussed importance of tight BP control  - monitor  5. Epiretinal membrane, right  eye   - stable nasal ERM  - no indication for surgery at this time  6. Pseudophakia OU  - s/p CE/IOL OU by [facility] in [facility]  - doing well  - monitor  Ophthalmic Meds Ordered this visit:  Meds ordered this encounter  Medications   aflibercept (EYLEA) SOLN 2 mg     Return in about 4 weeks (around 09/06/2021) for f/u BRVO OD, DFE, OCT.  This document serves as a record of services personally performed by Gardiner Sleeper, MD, PhD. It was created on their behalf by San Jetty. Owens Shark, OA an ophthalmic technician. The creation of this record is the provider's dictation and/or activities during the visit.    Electronically signed by: San Jetty. Owens Shark, [place] 08.01.2023 5:04 PM  Gardiner Sleeper, M.D., Ph.D. Diseases & Surgery of the Retina and Vitreous [facility]  I have reviewed the above documentation for accuracy and completeness, and I agree with the above. Gardiner Sleeper, M.D., Ph.D. 08/09/21 5:08 PM  Abbreviations: M myopia (nearsighted); A astigmatism; H hyperopia (farsighted); P presbyopia; Mrx spectacle prescription;  CTL contact lenses; OD right eye; OS left eye; OU both eyes  XT exotropia; ET esotropia; PEK punctate epithelial keratitis; PEE punctate epithelial erosions; DES dry  eye syndrome; MGD meibomian gland dysfunction; ATs artificial tears; PFAT's preservative free artificial tears; [place] nuclear sclerotic cataract; PSC posterior subcapsular cataract; ERM epi-retinal membrane; PVD posterior vitreous detachment; RD retinal detachment; DM diabetes mellitus; DR diabetic retinopathy; NPDR non-proliferative diabetic retinopathy; PDR proliferative diabetic retinopathy; CSME clinically significant macular edema; DME diabetic macular edema; dbh dot blot hemorrhages; CWS cotton wool spot; POAG primary open angle glaucoma; C/D cup-to-disc ratio; HVF humphrey visual field; GVF goldmann visual field; OCT optical coherence tomography; IOP intraocular pressure; BRVO Branch retinal vein occlusion; CRVO central retinal vein occlusion; CRAO central retinal artery occlusion; BRAO branch retinal artery occlusion; RT retinal tear; SB scleral buckle; PPV pars plana vitrectomy; VH Vitreous hemorrhage; PRP panretinal laser photocoagulation; IVK intravitreal kenalog; VMT vitreomacular traction; MH Macular hole;  NVD neovascularization of the disc; NVE neovascularization elsewhere; AREDS age related eye disease study; ARMD age related macular degeneration; POAG primary open angle glaucoma; EBMD epithelial/anterior basement membrane dystrophy; ACIOL anterior chamber intraocular lens; IOL intraocular lens; PCIOL posterior chamber intraocular lens; Phaco/IOL phacoemulsification with intraocular lens placement; [place] photorefractive keratectomy; LASIK laser assisted in situ keratomileusis; HTN hypertension; DM diabetes mellitus; COPD chronic obstructive pulmonary disease

## 2021-08-09 ENCOUNTER — Encounter (INDEPENDENT_AMBULATORY_CARE_PROVIDER_SITE_OTHER): Payer: Self-pay | Admitting: Ophthalmology

## 2021-08-09 ENCOUNTER — Ambulatory Visit (INDEPENDENT_AMBULATORY_CARE_PROVIDER_SITE_OTHER): Payer: Medicare Other | Admitting: Ophthalmology

## 2021-08-09 DIAGNOSIS — H35371 Puckering of macula, right eye: Secondary | ICD-10-CM

## 2021-08-09 DIAGNOSIS — I10 Essential (primary) hypertension: Secondary | ICD-10-CM

## 2021-08-09 DIAGNOSIS — H34831 Tributary (branch) retinal vein occlusion, right eye, with macular edema: Secondary | ICD-10-CM | POA: Diagnosis not present

## 2021-08-09 DIAGNOSIS — E113213 Type 2 diabetes mellitus with mild nonproliferative diabetic retinopathy with macular edema, bilateral: Secondary | ICD-10-CM

## 2021-08-09 DIAGNOSIS — H35033 Hypertensive retinopathy, bilateral: Secondary | ICD-10-CM

## 2021-08-09 DIAGNOSIS — Z961 Presence of intraocular lens: Secondary | ICD-10-CM

## 2021-08-09 MED ORDER — AFLIBERCEPT 2MG/0.05ML IZ SOLN FOR KALEIDOSCOPE
2.0000 mg | INTRAVITREAL | Status: AC | PRN
  Administered 2021-08-09: 2 mg via INTRAVITREAL

## 2021-08-17 ENCOUNTER — Ambulatory Visit (INDEPENDENT_AMBULATORY_CARE_PROVIDER_SITE_OTHER): Payer: Medicare Other | Admitting: Surgery

## 2021-08-17 ENCOUNTER — Encounter: Payer: Self-pay | Admitting: Surgery

## 2021-08-17 VITALS — BP 143/70 | HR 85 | Temp 98.8°F | Ht 65.0 in | Wt 163.0 lb

## 2021-08-17 DIAGNOSIS — Z9049 Acquired absence of other specified parts of digestive tract: Secondary | ICD-10-CM

## 2021-08-17 DIAGNOSIS — R197 Diarrhea, unspecified: Secondary | ICD-10-CM

## 2021-08-17 DIAGNOSIS — R1084 Generalized abdominal pain: Secondary | ICD-10-CM

## 2021-08-17 NOTE — Patient Instructions (Addendum)
You may try adding a bulking agent to your diet to help firm up your stools. You may try Metamucil or Benefiber or soluble fibers like these. Foods to eat:  Oats, beans, bran, and barley are also good sources of soluble fiber Apples Oranges Pears Strawberries Blueberries Peas Avocados Sweet potatoes Carrots Turnips Insoluble fibers would be psyllium fiber husks   You may try either to see if these help you more.   You may also add Pepto Bismol for diarrhea.   Keep your appointment with your GI doctor.  -Go to [facility] [place] entrance and go to the lab to pick up your stool kit. They will give you instructions on this.   -You are scheduled for a CT abdomen/pelvis with contrast at [facility] for 09/01/21. You will need to arrive at the [place] entrance at 8:00 am. Nothing to eat for 4 hours prior and pick up prep kit.    We will call you with your CT results.

## 2021-08-17 NOTE — Progress Notes (Signed)
Surgical Clinic Progress/Follow-up Note   HPI:  76 y.o. Female presents to clinic for GI issues follow-up.  She is status post low anterior resection, of note most recently she having severe irregularity, this required taking an antidiarrheal fairly consistently well on a recent cruise.  She reports she has much variation in the type of her stools extending from watery to mucousy.  She reports having some rectal pain after bowel movements.  She reports sacral pain after bowel activity.  She denies fevers and chills.  She reports the only thing she has taken as an antidiarrheal to help control.  She often wears a depends type of garment, and fears soilage.  This is significantly worse since her last visit.  She otherwise has been tolerating regular diet, denies N/V, CP, or SOB.  Review of Systems:  Constitutional: denies fever/chills  ENT: denies sore throat, hearing problems  Respiratory: denies shortness of breath, wheezing  Cardiovascular: denies chest pain, palpitations  Gastrointestinal: denies abdominal pain, N/V, and bowel function as per interval history Skin: Denies any other rashes or skin discolorations  as per interval history  Vital Signs:  BP (!) 143/70   Pulse 85   Temp 98.8 F (37.1 C)   Ht 5\' 5"  (1.651 m)   Wt 163 lb (73.9 kg)   SpO2 97%   BMI 27.12 kg/m    Physical Exam:  Constitutional:  -- Normal body habitus  -- Awake, alert, and oriented x3  Pulmonary:  -- No crackles -- Equal breath sounds bilaterally -- Breathing non-labored at rest Cardiovascular:  -- S1, S2 present  -- No pericardial rubs  Gastrointestinal:  -- Soft and non-distended, non-tender/with no incisional tenderness to palpation, no guarding/rebound tenderness -- Post-surgical incisions all well-approximated without any peri-incisional erythema or drainage -- No abdominal masses appreciated, pulsatile or otherwise  She deferred DRE. Musculoskeletal / Integumentary:  -- Wounds or skin  discoloration: None appreciated -- Extremities: B/L UE and LE FROM, hands and feet warm, no edema     Imaging: No new pertinent imaging available for review   Assessment:  76 y.o. yo Female with a problem list including...  Patient Active Problem List   Diagnosis Date Noted   Status post laparoscopic colectomy 06/08/2021   Gross hematuria 02/02/2021   AKI (acute kidney injury) (HCC) 08/11/2020   Acute kidney failure, unspecified (HCC) 08/11/2020   Amputated toe of right foot (HCC) 05/13/2020   Gangrene of toe of right foot (HCC) 05/04/2020   Major depressive disorder, recurrent, mild (HCC) 11/06/2019   Benign hypertensive kidney disease with chronic kidney disease 10/30/2019   Hyperkalemia 10/30/2019   Proteinuria 10/30/2019   History of 2019 novel coronavirus disease (COVID-19) 02/20/2019   Anemia of chronic kidney failure, stage 3 (moderate) (HCC) 02/05/2019   Normocytic anemia 02/01/2019   Osteoporosis 11/15/2018   Atherosclerosis of native arteries of extremity with rest pain (HCC) 09/25/2018   Osteoporosis, 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   Wound disruption, post-op, skin, subsequent encounter 04/09/2018   Postoperative wound infection 03/21/2018   Atherosclerosis of artery of extremity with intermittent claudication (HCC) 03/08/2018   Atherosclerosis of native arteries of the extremities with ulceration (HCC) 10/13/2017   DM type 2 with diabetic peripheral neuropathy (HCC) 10/12/2017   History of hip fracture 10/12/2017   Type 2 diabetes mellitus with vascular disease (HCC) 10/12/2017   Carotid atherosclerosis, bilateral 07/11/2017   Medicare annual wellness visit, initial 05/03/2017   Peripheral vascular disease, unspecified (HCC) 03/01/2017   Diabetic ulcer of toe of left  foot associated with type 2 diabetes mellitus (HCC) 03/01/2017   Chronic painful diabetic neuropathy (HCC) 06/02/2016   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   Hip fracture (HCC) 10/30/2015    Greater trochanter fracture (HCC) 10/29/2015   Acquired hypothyroidism 10/16/2015   Stage 3b chronic kidney disease (HCC) 10/16/2015   Hyperlipidemia, mixed 04/16/2015   Benign essential hypertension 04/16/2015   Closed compression fracture of lumbar vertebra (HCC) 04/16/2015   Iron deficiency anemia 09/22/2014   Disease of thyroid gland 09/22/2014   Diabetes mellitus type 2, uncontrolled 05/13/2014   Lichen sclerosus 12/30/2013   History of positive PPD 10/08/2013    presents to clinic for follow-up evaluation of irregular bowel activity frequent diarrhea and irregularity, possible soilage, awaiting GI evaluation.  Plan:   -We will check for C. difficile, obtain abdominal/pelvic CT scan, to evaluate for any other etiology.             - return to clinic 3 wks or as needed, instructed to call office if any questions or concerns  -We discussed the role of fiber in terms of providing opportunity for regularity, debated the pros and cons of soluble versus insoluble.  Discussed utilizing by trial and air the amount and frequency and type of fiber that would be most helpful.  Also discussed utilization of Pepto-Bismol.  We will follow-up the above evaluations, and touch base with her regarding her progress.  All of the above recommendations were discussed with the patient, and all of patient's questions were answered to her expressed satisfaction.  These notes generated with voice recognition software. I apologize for typographical errors.  Campbell Lerner, MD, FACS [place]: [facility] General Surgery - Partnering for exceptional care. Office: [phone]

## 2021-08-20 ENCOUNTER — Other Ambulatory Visit
Admission: RE | Admit: 2021-08-20 | Discharge: 2021-08-20 | Disposition: A | Discharge status: Home / Self Care | Payer: Medicare Other | Source: Ambulatory Visit | Attending: Surgery | Admitting: Surgery

## 2021-08-20 DIAGNOSIS — R1084 Generalized abdominal pain: Secondary | ICD-10-CM | POA: Insufficient documentation

## 2021-08-20 DIAGNOSIS — R197 Diarrhea, unspecified: Secondary | ICD-10-CM | POA: Diagnosis present

## 2021-08-20 LAB — C DIFFICILE QUICK SCREEN W PCR REFLEX
C Diff antigen: NEGATIVE
C Diff interpretation: NOT DETECTED
C Diff toxin: NEGATIVE

## 2021-08-20 MED FILL — Iron Sucrose Inj 20 MG/ML (Fe Equiv): INTRAVENOUS | Qty: 10 | Status: AC

## 2021-08-22 NOTE — Assessment & Plan Note (Deleted)
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  Improved s/p IV iron infusions however today- Hb 7.9. awaiting iron studies today. Proceed with Venofer today.  # Discussed use of erythropoietin stimulating agents like retacrit to stimulate the bone marrow.  Discussed the potential issues with erythropoietin estimating agents-given the risk of stroke thromboembolic events/elevated blood pressure.  However, most of the serious events did not happen when the goal hematocrit is 33/hemoglobin 30.  Await repeat blood work in 6 weeks; if hemoglobin consistently low-recommend starting retacrit.  Also discussed re: bone marrow Biopsy; will hold off for now.   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   # Etiology-CKD-III; GFR ~35 overall -stable [Dr.Kolluru]; recommend continued hydration.  #Poor IV access/Mediport placement-stable  # DISPOSITION:  # Venofer today; NO retacrit # venofer in 1 weeks # venofer in 2 weeks.  # follow up 6 weeks MD; port; labs- cbc/bmp; ;possible retacrit-venofer- Dr.B  

## 2021-08-22 NOTE — Progress Notes (Deleted)
[place] NOTE  Patient Care Team: Rusty Aus, MD as PCP - General (Internal Medicine) Josefine Class, MD as Referring Physician (Gastroenterology) Cammie Sickle, MD as Consulting Physician (Hematology and Oncology)  CHIEF COMPLAINTS/PURPOSE OF CONSULTATION: Anemia  HEMATOLOGY HISTORY  # ANEMIA- Jan 2021- 8.8/ferritin 11 [PCP]; N-WBC/platelets? IDA vs other- EGD-2015/colonoscopy-? 2015; 2020- [Dr.Skulskie] ; capsule-2016- ? 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   HISTORY OF PRESENTING ILLNESS: Ambulating independently.  Alone.  Cheryl Leonard 76 y.o.  female anemia iron deficiency-question CKD-III s here for follow-up.   I spoke to patient regarding concerns for ongoing anemia.  Patient interested in bone marrow biopsy to further evaluate however given iron deficiency I would not recommend a bone marrow biopsy at this time.  Patient had eval/colonoscopy/EGD/camera work-up.  For now continue IV Venofer as ordered.  Patient in agreement.  Patient states that she is s/p amputation of the left toe. Complains of mild to moderate fatigue.  Denies any blood in stools or black-colored stools.  Review of Systems  Constitutional:  Positive for malaise/fatigue. Negative for chills, diaphoresis and fever.  HENT:  Negative for nosebleeds and sore throat.   Eyes:  Negative for double vision.  Respiratory:  Positive for shortness of breath. Negative for cough, hemoptysis, sputum production and wheezing.   Cardiovascular:  Negative for chest pain, palpitations, orthopnea and leg swelling.  Gastrointestinal:  Negative for abdominal pain, blood in stool, constipation, diarrhea, heartburn, melena, nausea and vomiting.  Genitourinary:  Negative for dysuria, frequency and urgency.  Musculoskeletal:  Positive  for joint pain.  Skin: Negative.  Negative for itching and rash.  Neurological:  Negative for dizziness, tingling, focal weakness, weakness and headaches.  Endo/Heme/Allergies:  Does not bruise/bleed easily.  Psychiatric/Behavioral:  Negative for depression. The patient is not nervous/anxious and does not have insomnia.     MEDICAL HISTORY:  Past Medical History:  Diagnosis Date   Anemia    Anxiety    Arthritis    Gout   Cataracts, both eyes    Diabetic retinopathy ([place])    NPDR OU   Diverticulitis    GERD (gastroesophageal reflux disease)    Gout    Headache    h/o migraines   History of fracture of patella    right knee   History of positive PPD    Patient always shows positive   Hyperlipidemia    Hypertension    Hypertensive retinopathy    OU   Hypothyroidism    Lichen sclerosus 12/75/1700   of vulva   Metatarsal fracture    Neuropathy    Osteopenia    Peripheral vascular disease (HCC)    Polyneuropathy    numbness and tingling in feet and toes   Renal insufficiency    Stage 3   Sleep apnea    does not use cpap-lost weight    Type 2 diabetes mellitus, uncontrolled     SURGICAL HISTORY: Past Surgical History:  Procedure Laterality Date   ABDOMINAL HYSTERECTOMY     AMPUTATION TOE Right 05/08/2020   Procedure: AMPUTATION TOE-Right 4th Toe;  Surgeon: Caroline More, DPM;  Location: [facility];  Service: Podiatry;  Laterality: Right;   AMPUTATION TOE Left 04/22/2021   Procedure: AMPUTATION TOE - 4TH METARSOPHANGEAL JOINT;  Surgeon: Caroline More, DPM;  Location: [facility];  Service:  Podiatry;  Laterality: Left;   APPENDECTOMY     BREAST REDUCTION SURGERY  2001   CATARACT EXTRACTION     CESAREAN SECTION  1976   COLONOSCOPY  03/05/2013   Nml - due for repeat 03/06/2018   COLONOSCOPY WITH PROPOFOL N/A 03/18/2019   Procedure: COLONOSCOPY WITH PROPOFOL;  Surgeon: Toledo, Benay Pike, MD;  Location: [facility];  Service: Gastroenterology;  Laterality: N/A;   DIAGNOSTIC  LAPAROSCOPY     DILATION AND CURETTAGE OF UTERUS  1989   ENDARTERECTOMY FEMORAL Bilateral 03/09/2018   Procedure: ENDARTERECTOMY FEMORAL;  Surgeon: Algernon Huxley, MD;  Location: [facility];  Service: Vascular;  Laterality: Bilateral;   ENDARTERECTOMY POPLITEAL Left 03/09/2018   Procedure: ENDARTERECTOMY POPLITEAL AND SFA;  Surgeon: Algernon Huxley, MD;  Location: [facility];  Service: Vascular;  Laterality: Left;   ESOPHAGOGASTRODUODENOSCOPY  03/05/2013   ESOPHAGOGASTRODUODENOSCOPY (EGD) WITH PROPOFOL N/A 03/18/2019   Procedure: ESOPHAGOGASTRODUODENOSCOPY (EGD) WITH PROPOFOL;  Surgeon: Toledo, Benay Pike, MD;  Location: [facility];  Service: Gastroenterology;  Laterality: N/A;   EYE SURGERY     Eyelid Surgery  2012   INTRAMEDULLARY (IM) NAIL INTERTROCHANTERIC Left 10/30/2015   Procedure: INTRAMEDULLARY (IM) NAIL INTERTROCHANTRIC ;  Surgeon: Hessie Knows, MD;  Location: [facility];  Service: Orthopedics;  Laterality: Left;   KYPHOPLASTY N/A 10/25/2018   Procedure: L4 KYPHOPLASTY;  Surgeon: Hessie Knows, MD;  Location: [facility];  Service: Orthopedics;  Laterality: N/A;   LAPAROSCOPIC HYSTERECTOMY  2000   total   LOWER EXTREMITY ANGIOGRAPHY Left 03/08/2017   Procedure: LOWER EXTREMITY ANGIOGRAPHY;  Surgeon: Algernon Huxley, MD;  Location: [place] [facility];  Service: Cardiovascular;  Laterality: Left;   LOWER EXTREMITY ANGIOGRAPHY Left 10/30/2017   Procedure: LOWER EXTREMITY ANGIOGRAPHY;  Surgeon: Algernon Huxley, MD;  Location: [place] [facility];  Service: Cardiovascular;  Laterality: Left;   LOWER EXTREMITY ANGIOGRAPHY Right 03/08/2018   Procedure: LOWER EXTREMITY ANGIOGRAPHY;  Surgeon: Algernon Huxley, MD;  Location: [place] [facility];  Service: Cardiovascular;  Laterality: Right;   LOWER EXTREMITY ANGIOGRAPHY Left 10/01/2018   Procedure: LOWER EXTREMITY ANGIOGRAPHY;  Surgeon: Algernon Huxley, MD;  Location: [place] [facility];  Service: Cardiovascular;  Laterality: Left;   LOWER EXTREMITY ANGIOGRAPHY Right  10/08/2018   Procedure: LOWER EXTREMITY ANGIOGRAPHY;  Surgeon: Algernon Huxley, MD;  Location: [place] [facility];  Service: Cardiovascular;  Laterality: Right;   LOWER EXTREMITY ANGIOGRAPHY Right 05/07/2020   Procedure: Lower Extremity Angiography;  Surgeon: Algernon Huxley, MD;  Location: [place] [facility];  Service: Cardiovascular;  Laterality: Right;   LYSIS OF ADHESION  01/25/2021   Procedure: LYSIS OF ADHESION;  Surgeon: Ronny Bacon, MD;  Location: [facility];  Service: General;;   PERIPHERAL VASCULAR INTERVENTION  03/08/2018   Procedure: PERIPHERAL VASCULAR INTERVENTION;  Surgeon: Algernon Huxley, MD;  Location: [place] [facility];  Service: Cardiovascular;;   PORTA CATH INSERTION N/A 02/17/2020   Procedure: PORTA CATH INSERTION;  Surgeon: Algernon Huxley, MD;  Location: [place] [facility];  Service: Cardiovascular;  Laterality: N/A;   REDUCTION MAMMAPLASTY  1997   SACROPLASTY N/A 10/25/2018   Procedure: S1 SACROPLASTY;  Surgeon: Hessie Knows, MD;  Location: [facility];  Service: Orthopedics;  Laterality: N/A;    SOCIAL HISTORY: Social History   Socioeconomic History   Marital status: Married    Spouse name: [name]   Number of children: 3   Years of education: Not on file   Highest education level: Not on  file  Occupational History   Occupation: Retail banker  Tobacco Use   Smoking status: Former    Packs/day: 1.00    Years: 20.00    Total pack years: 20.00    Types: Cigarettes    Quit date: 03/07/1996    Years since quitting: 25.4    Passive exposure: Past   Smokeless tobacco: Never   Tobacco comments:    started smoking at age 29 but stopped smoking in 2000  Vaping Use   Vaping Use: Never used  Substance and Sexual Activity   Alcohol use: No    Alcohol/week: 0.0 standard drinks of alcohol   Drug use: No   Sexual activity: Yes    Partners: Male    Birth control/protection: Surgical  Other Topics Concern   Not on file  Social History Narrative   Lives in [place];  with husband; quit > 20 years; no alcohol; used to work at [employer]  at [employer].    Social Determinants of Health   Financial Resource Strain: Low Risk  (10/08/2018)   Overall Financial Resource Strain (CARDIA)    Difficulty of Paying Living Expenses: Not very hard  Food Insecurity: No Food Insecurity (10/08/2018)   Hunger Vital Sign    Worried About Running Out of Food in the Last Year: Never true    Ran Out of Food in the Last Year: Never true  Transportation Needs: Unknown (10/01/2018)   PRAPARE - Hydrologist (Medical): No    Lack of Transportation (Non-Medical): Not on file  Physical Activity: Unknown (10/01/2018)   Exercise Vital Sign    Days of Exercise per Week: 2 days    Minutes of Exercise per Session: Not on file  Stress: Stress Concern Present (10/01/2018)   [place]    Feeling of Stress : To some extent  Social Connections: Unknown (10/08/2018)   Social Connection and Isolation Panel [NHANES]    Frequency of Communication with Friends and Family: More than three times a week    Frequency of Social Gatherings with Friends and Family: Not on file    Attends Religious Services: Not on file    Active Member of Clubs or Organizations: Not on file    Attends Archivist Meetings: Not on file    Marital Status: Married  Intimate Partner Violence: Not At Risk (10/08/2018)   Humiliation, Afraid, Rape, and Kick questionnaire    Fear of Current or Ex-Partner: No    Emotionally Abused: No    Physically Abused: No    Sexually Abused: No    FAMILY HISTORY: Family History  Problem Relation Age of Onset   Coronary artery disease Father    Heart attack Father    Coronary artery disease Mother    Heart attack Mother    Ovarian cancer Sister 91       sister had hormonal therapy for IVF txs-which increased risk factor for ovarian cancer   Breast cancer Neg Hx      ALLERGIES:  has No Known Allergies.  MEDICATIONS:  Current Outpatient Medications  Medication Sig Dispense Refill   ALPRAZolam (XANAX) 0.25 MG tablet Take 0.25 mg by mouth daily as needed for anxiety.     amLODipine (NORVASC) 5 MG tablet Take 5 mg by mouth 2 (two) times daily.     aspirin EC 81 MG tablet Take 1 tablet (81 mg total) by mouth daily. 30 tablet 11   cholecalciferol (VITAMIN  D) 1000 units tablet Take 1,000 Units by mouth 2 (two) times daily.     CORAL CALCIUM PO Take 1 tablet by mouth daily.     denosumab (PROLIA) 60 MG/ML SOLN injection Inject 60 mg into the skin every 6 (six) months.      escitalopram (LEXAPRO) 10 MG tablet Take 10 mg by mouth daily.     estradiol (ESTRACE) 0.1 MG/GM vaginal cream Place 1 Applicatorful vaginally daily as needed (vaginal irritation).     famotidine (PEPCID) 40 MG tablet Take 40 mg by mouth daily.     furosemide (LASIX) 20 MG tablet Take 20 mg by mouth every Wednesday.     gabapentin (NEURONTIN) 300 MG capsule Take 300 mg by mouth at bedtime as needed (pain).     HYDROcodone-acetaminophen (NORCO/VICODIN) 5-325 MG tablet Take 1 tablet by mouth daily.     levothyroxine (SYNTHROID, LEVOTHROID) 100 MCG tablet Take 100 mcg by mouth daily before breakfast.   3   lidocaine-prilocaine (EMLA) cream Apply 1 application topically as needed (apply prior to port a cath access). 30 g 3   Magnesium 500 MG TABS Take 500 mg by mouth every morning.      metFORMIN (GLUCOPHAGE) 1000 MG tablet Take 1,000 mg by mouth 2 (two) times daily with a meal.     metoprolol succinate (TOPROL-XL) 50 MG 24 hr tablet Take 50 mg by mouth daily. Take with or immediately following a meal.     mirtazapine (REMERON) 15 MG tablet Take 15 mg by mouth as needed.     Multiple Vitamin (MULTIVITAMIN WITH MINERALS) TABS tablet Take 1 tablet by mouth daily. Centrum Silver     nystatin cream (MYCOSTATIN) Apply 1 application topically daily as needed (Yeast infection).     olmesartan  (BENICAR) 20 MG tablet Take 20 mg by mouth daily.     pantoprazole (PROTONIX) 40 MG tablet Take 40 mg by mouth daily.     pantoprazole sodium (PROTONIX) 40 mg Take 40 mg by mouth every morning.      rivaroxaban (XARELTO) 20 MG TABS tablet Take 1 tablet (20 mg total) by mouth daily with supper. 90 tablet 3   rosuvastatin (CRESTOR) 20 MG tablet Take 20 mg by mouth every morning.     TRESIBA FLEXTOUCH 200 UNIT/ML SOPN Inject 25-30 Units as directed at bedtime. Titrate according to fasting blood glucose not to exceed 50 units a day  5   vitamin B-12 (CYANOCOBALAMIN) 1000 MCG tablet Take 1,000 mcg by mouth daily.     vitamin C (ASCORBIC ACID) 250 MG tablet Take 250 mg by mouth daily.     vitamin E 400 UNIT capsule Take 400 Units by mouth daily.     zolpidem (AMBIEN) 10 MG tablet Take 10 mg by mouth at bedtime.     No current facility-administered medications for this visit.      PHYSICAL EXAMINATION:   There were no vitals filed for this visit.  There were no vitals filed for this visit.   Physical Exam Constitutional:      Comments: Alone.  Ambulating independently.  HENT:     Head: Normocephalic and atraumatic.     Mouth/Throat:     Pharynx: No oropharyngeal exudate.  Eyes:     Pupils: Pupils are equal, round, and reactive to light.  Cardiovascular:     Rate and Rhythm: Normal rate and regular rhythm.  Pulmonary:     Effort: Pulmonary effort is normal. No respiratory distress.  Breath sounds: Normal breath sounds. No wheezing.  Abdominal:     General: Bowel sounds are normal. There is no distension.     Palpations: Abdomen is soft. There is no mass.     Tenderness: There is no abdominal tenderness. There is no guarding or rebound.  Musculoskeletal:        General: No tenderness. Normal range of motion.     Cervical back: Normal range of motion and neck supple.  Skin:    General: Skin is warm.  Neurological:     Mental Status: She is alert and oriented to person, place,  and time.  Psychiatric:        Mood and Affect: Affect normal.     LABORATORY DATA:  I have reviewed the data as listed Lab Results  Component Value Date   WBC 5.0 07/12/2021   HGB 7.9 (L) 07/12/2021   HCT 25.8 (L) 07/12/2021   MCV 86.6 07/12/2021   PLT 244 07/12/2021   Recent Labs    10/16/20 1329 01/11/21 1317 01/25/21 1349 04/12/21 1450 04/14/21 1135 07/12/21 0900  NA 133* 134*   < > 133* 138 134*  K 5.1 4.6   < > 4.9 4.3 4.0  CL 105 102   < > 105 105 102  CO2 21* 24   < > 22 24 22   GLUCOSE 195* 279*   < > 140* 165* 216*  BUN 38* 34*   < > 32* 23 37*  CREATININE 1.94* 1.33*   < > 1.53* 1.39* 1.37*  CALCIUM 9.2 9.7   < > 9.2 9.9 9.4  GFRNONAA 27* 42*   < > 35* 39* 40*  PROT 6.8 7.1  --   --   --   --   ALBUMIN 3.6 3.7  --   --   --   --   AST 22 20  --   --   --   --   ALT 19 18  --   --   --   --   ALKPHOS 46 63  --   --   --   --   BILITOT 0.6 0.6  --   --   --   --    < > = values in this interval not displayed.      Intravitreal Injection, Pharmacologic Agent - OD - Right Eye  Result Date: 08/09/2021 Time Out 08/09/2021. 3:35 PM. Confirmed correct patient, procedure, site, and patient consented. Anesthesia Topical anesthesia was used. Anesthetic medications included Lidocaine 2%, Proparacaine 0.5%. Procedure Preparation included 5% betadine to ocular surface, eyelid speculum. A (32 g) needle was used. Injection: 2 mg aflibercept 2 MG/0.05ML   Route: Intravitreal, Site: Right Eye   NDC: [id number], Lot: [id number], Expiration date: 10/03/2022, Waste: 0 mL Post-op Post injection exam found visual acuity of at least counting fingers. The patient tolerated the procedure well. There were no complications. The patient received written and verbal post procedure care education. Post injection medications were not given.   OCT, Retina - OU - Both Eyes  Result Date: 08/09/2021 Right Eye Quality was good. Central Foveal Thickness: 412. Progression has worsened. Findings  include no SRF, abnormal foveal contour, epiretinal membrane, intraretinal fluid (Persistent IRF/central edema -- slightly increased). Left Eye Quality was good. Central Foveal Thickness: 284. Progression has been stable. Findings include normal foveal contour, no IRF, no SRF (Trace ERM). Notes *Images captured and stored on drive Diagnosis / Impression: OD: BRVO w/ CME - Persistent IRF/central edema --  slightly increased OS: NFP; no IRF/SRF--stable, trace ERM Clinical management: See below Abbreviations: NFP - Normal foveal profile. CME - cystoid macular edema. PED - pigment epithelial detachment. IRF - intraretinal fluid. SRF - subretinal fluid. EZ - ellipsoid zone. ERM - epiretinal membrane. ORA - outer retinal atrophy. ORT - outer retinal tubulation. SRHM - subretinal hyper-reflective material     No problem-specific Assessment & Plan notes found for this encounter.   All questions were answered. The patient knows to call the clinic with any problems, questions or concerns.    Cammie Sickle, MD 08/22/2021 1:55 PM

## 2021-08-23 ENCOUNTER — Inpatient Hospital Stay: Payer: Medicare Other | Admitting: Internal Medicine

## 2021-08-23 ENCOUNTER — Telehealth: Payer: Self-pay | Admitting: Internal Medicine

## 2021-08-23 ENCOUNTER — Inpatient Hospital Stay: Payer: Medicare Other

## 2021-08-23 DIAGNOSIS — D649 Anemia, unspecified: Secondary | ICD-10-CM

## 2021-08-23 NOTE — Telephone Encounter (Signed)
Patient states she is not feeling well, diarrhea and slight temp. Wanted to cancel her appointments today and will call back to reschedule.

## 2021-08-25 ENCOUNTER — Ambulatory Visit
Admission: RE | Admit: 2021-08-25 | Discharge: 2021-08-25 | Disposition: A | Discharge status: Home / Self Care | Payer: Medicare Other | Source: Ambulatory Visit | Attending: Surgery | Admitting: Surgery

## 2021-08-25 DIAGNOSIS — Z9049 Acquired absence of other specified parts of digestive tract: Secondary | ICD-10-CM | POA: Insufficient documentation

## 2021-08-25 DIAGNOSIS — R1084 Generalized abdominal pain: Secondary | ICD-10-CM | POA: Insufficient documentation

## 2021-08-25 DIAGNOSIS — R197 Diarrhea, unspecified: Secondary | ICD-10-CM | POA: Diagnosis present

## 2021-08-25 MED ORDER — IOHEXOL 350 MG/ML SOLN
85.0000 mL | Freq: Once | INTRAVENOUS | Status: AC | PRN
  Administered 2021-08-25: 85 mL via INTRAVENOUS

## 2021-09-02 NOTE — Progress Notes (Signed)
[facility] Note  09/08/2021     CHIEF COMPLAINT Patient presents for Retina Follow Up   HISTORY OF PRESENT ILLNESS: Cheryl Leonard is a 76 y.o. female who presents to the clinic today for:   HPI     Retina Follow Up   Patient presents with  CRVO/BRVO.  In right eye.  This started weeks ago.  Duration of 4 weeks.  Since onset it is stable.  I, the attending physician,  performed the HPI with the patient and updated documentation appropriately.        Comments   Patient here for 4 weeks retina follow up for BRVO OD. Patient states vision doing the same as always. No eye pain.       Last edited by Bernarda Caffey, MD on 09/08/2021  9:20 PM.    Patient states vision the same OU.  Referring physician: Rusty Aus, MD [place],  [place] [address]  HISTORICAL INFORMATION:   Selected notes from the MEDICAL RECORD NUMBER Referred by Dr. Marvel Plan for concern of DME OD Lab Results  Component Value Date   HGBA1C 7.3 (H) 01/11/2021     CURRENT MEDICATIONS: No current outpatient medications on file. (Ophthalmic Drugs)   No current facility-administered medications for this visit. (Ophthalmic Drugs)   Current Outpatient Medications (Other)  Medication Sig   ALPRAZolam (XANAX) 0.25 MG tablet Take 0.25 mg by mouth daily as needed for anxiety.   amLODipine (NORVASC) 5 MG tablet Take 5 mg by mouth 2 (two) times daily.   aspirin EC 81 MG tablet Take 1 tablet (81 mg total) by mouth daily.   cholecalciferol (VITAMIN D) 1000 units tablet Take 1,000 Units by mouth 2 (two) times daily.   CORAL CALCIUM PO Take 1 tablet by mouth daily.   denosumab (PROLIA) 60 MG/ML SOLN injection Inject 60 mg into the skin every 6 (six) months.    escitalopram (LEXAPRO) 10 MG tablet Take 10 mg by mouth daily.   estradiol (ESTRACE) 0.1 MG/GM vaginal cream Place 1 Applicatorful vaginally daily as needed (vaginal  irritation).   famotidine (PEPCID) 40 MG tablet Take 40 mg by mouth daily.   furosemide (LASIX) 20 MG tablet Take 20 mg by mouth every Wednesday.   gabapentin (NEURONTIN) 300 MG capsule Take 300 mg by mouth at bedtime as needed (pain).   HYDROcodone-acetaminophen (NORCO/VICODIN) 5-325 MG tablet Take 1 tablet by mouth daily.   levothyroxine (SYNTHROID, LEVOTHROID) 100 MCG tablet Take 100 mcg by mouth daily before breakfast.    lidocaine-prilocaine (EMLA) cream Apply 1 application topically as needed (apply prior to port a cath access).   Magnesium 500 MG TABS Take 500 mg by mouth every morning.    metFORMIN (GLUCOPHAGE) 1000 MG tablet Take 1,000 mg by mouth 2 (two) times daily with a meal.   metoprolol succinate (TOPROL-XL) 50 MG 24 hr tablet Take 50 mg by mouth daily. Take with or immediately following a meal.   mirtazapine (REMERON) 15 MG tablet Take 15 mg by mouth as needed.   Multiple Vitamin (MULTIVITAMIN WITH MINERALS) TABS tablet Take 1 tablet by mouth daily. Centrum Silver   nystatin cream (MYCOSTATIN) Apply 1 application topically daily as needed (Yeast infection).   olmesartan (BENICAR) 20 MG tablet Take 20 mg by mouth daily.   pantoprazole (PROTONIX) 40 MG tablet Take 40 mg by mouth daily.   pantoprazole sodium (PROTONIX) 40 mg Take 40 mg by mouth every  morning.    rivaroxaban (XARELTO) 20 MG TABS tablet Take 1 tablet (20 mg total) by mouth daily with supper.   rosuvastatin (CRESTOR) 20 MG tablet Take 20 mg by mouth every morning.   TRESIBA FLEXTOUCH 200 UNIT/ML SOPN Inject 25-30 Units as directed at bedtime. Titrate according to fasting blood glucose not to exceed 50 units a day   vitamin B-12 (CYANOCOBALAMIN) 1000 MCG tablet Take 1,000 mcg by mouth daily.   vitamin C (ASCORBIC ACID) 250 MG tablet Take 250 mg by mouth daily.   vitamin E 400 UNIT capsule Take 400 Units by mouth daily.   zolpidem (AMBIEN) 10 MG tablet Take 10 mg by mouth at bedtime.   No current facility-administered  medications for this visit. (Other)   REVIEW OF SYSTEMS: ROS   Positive for: Gastrointestinal, Musculoskeletal, Endocrine, Cardiovascular, Eyes, Respiratory Negative for: Constitutional, Neurological, Skin, Genitourinary, HENT, Psychiatric, Allergic/Imm, Heme/Lymph Last edited by Theodore Demark, COA on 09/08/2021  2:38 PM.      ALLERGIES No Known Allergies  PAST MEDICAL HISTORY Past Medical History:  Diagnosis Date   Anemia    Anxiety    Arthritis    Gout   Cataracts, both eyes    Diabetic retinopathy ([place])    NPDR OU   Diverticulitis    GERD (gastroesophageal reflux disease)    Gout    Headache    h/o migraines   History of fracture of patella    right knee   History of positive PPD    Patient always shows positive   Hyperlipidemia    Hypertension    Hypertensive retinopathy    OU   Hypothyroidism    Lichen sclerosus [id number]   of vulva   Metatarsal fracture    Neuropathy    Osteopenia    Peripheral vascular disease (HCC)    Polyneuropathy    numbness and tingling in feet and toes   Renal insufficiency    Stage 3   Sleep apnea    does not use cpap-lost weight    Type 2 diabetes mellitus, uncontrolled    Past Surgical History:  Procedure Laterality Date   ABDOMINAL HYSTERECTOMY     AMPUTATION TOE Right 05/08/2020   Procedure: AMPUTATION TOE-Right 4th Toe;  Surgeon: Caroline More, DPM;  Location: [facility];  Service: Podiatry;  Laterality: Right;   AMPUTATION TOE Left 04/22/2021   Procedure: AMPUTATION TOE - 4TH METARSOPHANGEAL JOINT;  Surgeon: Caroline More, DPM;  Location: [facility];  Service: Podiatry;  Laterality: Left;   APPENDECTOMY     BREAST REDUCTION SURGERY  2001   CATARACT EXTRACTION     CESAREAN SECTION  1976   COLONOSCOPY  03/05/2013   Nml - due for repeat 03/06/2018   COLONOSCOPY WITH PROPOFOL N/A 03/18/2019   Procedure: COLONOSCOPY WITH PROPOFOL;  Surgeon: Toledo, Benay Pike, MD;  Location: [facility];  Service: Gastroenterology;   Laterality: N/A;   DIAGNOSTIC LAPAROSCOPY     DILATION AND CURETTAGE OF UTERUS  1989   ENDARTERECTOMY FEMORAL Bilateral 03/09/2018   Procedure: ENDARTERECTOMY FEMORAL;  Surgeon: Algernon Huxley, MD;  Location: [facility];  Service: Vascular;  Laterality: Bilateral;   ENDARTERECTOMY POPLITEAL Left 03/09/2018   Procedure: ENDARTERECTOMY POPLITEAL AND SFA;  Surgeon: Algernon Huxley, MD;  Location: [facility];  Service: Vascular;  Laterality: Left;   ESOPHAGOGASTRODUODENOSCOPY  03/05/2013   ESOPHAGOGASTRODUODENOSCOPY (EGD) WITH PROPOFOL N/A 03/18/2019   Procedure: ESOPHAGOGASTRODUODENOSCOPY (EGD) WITH PROPOFOL;  Surgeon: Toledo, Benay Pike, MD;  Location: [facility];  Service:  Gastroenterology;  Laterality: N/A;   EYE SURGERY     Eyelid Surgery  2012   INTRAMEDULLARY (IM) NAIL INTERTROCHANTERIC Left 10/30/2015   Procedure: INTRAMEDULLARY (IM) NAIL INTERTROCHANTRIC ;  Surgeon: Kennedy Bucker, MD;  Location: [facility];  Service: Orthopedics;  Laterality: Left;   KYPHOPLASTY N/A 10/25/2018   Procedure: L4 KYPHOPLASTY;  Surgeon: Kennedy Bucker, MD;  Location: [facility];  Service: Orthopedics;  Laterality: N/A;   LAPAROSCOPIC HYSTERECTOMY  2000   total   LOWER EXTREMITY ANGIOGRAPHY Left 03/08/2017   Procedure: LOWER EXTREMITY ANGIOGRAPHY;  Surgeon: Annice Needy, MD;  Location: [facility];  Service: Cardiovascular;  Laterality: Left;   LOWER EXTREMITY ANGIOGRAPHY Left 10/30/2017   Procedure: LOWER EXTREMITY ANGIOGRAPHY;  Surgeon: Annice Needy, MD;  Location: [facility];  Service: Cardiovascular;  Laterality: Left;   LOWER EXTREMITY ANGIOGRAPHY Right 03/08/2018   Procedure: LOWER EXTREMITY ANGIOGRAPHY;  Surgeon: Annice Needy, MD;  Location: [facility];  Service: Cardiovascular;  Laterality: Right;   LOWER EXTREMITY ANGIOGRAPHY Left 10/01/2018   Procedure: LOWER EXTREMITY ANGIOGRAPHY;  Surgeon: Annice Needy, MD;  Location: [facility];  Service: Cardiovascular;  Laterality: Left;   LOWER  EXTREMITY ANGIOGRAPHY Right 10/08/2018   Procedure: LOWER EXTREMITY ANGIOGRAPHY;  Surgeon: Annice Needy, MD;  Location: [facility];  Service: Cardiovascular;  Laterality: Right;   LOWER EXTREMITY ANGIOGRAPHY Right 05/07/2020   Procedure: Lower Extremity Angiography;  Surgeon: Annice Needy, MD;  Location: [facility];  Service: Cardiovascular;  Laterality: Right;   LYSIS OF ADHESION  01/25/2021   Procedure: LYSIS OF ADHESION;  Surgeon: Campbell Lerner, MD;  Location: [facility];  Service: General;;   PERIPHERAL VASCULAR INTERVENTION  03/08/2018   Procedure: PERIPHERAL VASCULAR INTERVENTION;  Surgeon: Annice Needy, MD;  Location: [facility];  Service: Cardiovascular;;   PORTA CATH INSERTION N/A 02/17/2020   Procedure: PORTA CATH INSERTION;  Surgeon: Annice Needy, MD;  Location: [facility];  Service: Cardiovascular;  Laterality: N/A;   REDUCTION MAMMAPLASTY  1997   SACROPLASTY N/A 10/25/2018   Procedure: S1 SACROPLASTY;  Surgeon: Kennedy Bucker, MD;  Location: [facility];  Service: Orthopedics;  Laterality: N/A;   FAMILY HISTORY Family History  Problem Relation Age of Onset   Coronary artery disease Father    Heart attack Father    Coronary artery disease Mother    Heart attack Mother    Ovarian cancer Sister 50       sister had hormonal therapy for IVF txs-which increased risk factor for ovarian cancer   Breast cancer Neg Hx    SOCIAL HISTORY Social History   Tobacco Use   Smoking status: Former    Packs/day: 1.00    Years: 20.00    Total pack years: 20.00    Types: Cigarettes    Quit date: 03/07/1996    Years since quitting: 25.5    Passive exposure: Past   Smokeless tobacco: Never   Tobacco comments:    started smoking at age 63 but stopped smoking in 2000  Vaping Use   Vaping Use: Never used  Substance Use Topics   Alcohol use: No    Alcohol/week: 0.0 standard drinks of alcohol   Drug use: No       OPHTHALMIC EXAM: Base Eye Exam      Visual Acuity (Snellen - Linear)       Right Left   Dist [place] 20/40 -1 20/20 -1  Dist ph [place] NI          Tonometry (Tonopen, 2:36 PM)       Right Left   Pressure 09 10         Pupils       Dark Light Shape React APD   Right 3 2 Round Brisk None   Left 3 2 Round Brisk None         Visual Fields (Counting fingers)       Left Right    Full Full         Extraocular Movement       Right Left    Full, Ortho Full, Ortho         Neuro/Psych     Oriented x3: Yes   Mood/Affect: Normal         Dilation     Both eyes: 1.0% Mydriacyl, 2.5% Phenylephrine @ 2:36 PM           Slit Lamp and Fundus Exam     External Exam       Right Left   External Normal Normal         Slit Lamp Exam       Right Left   Lids/Lashes dermatochalasis dermatochalasis   Conjunctiva/Sclera White and quiet White and quiet   Cornea arcus; well healed cataract wound; 2-3+ diffuse Punctate epithelial erosions, decreased TBUT, mild Anterior basement membrane dystrophy superiorly arcus; well healed cataract wound, 2-3+ diffuse Punctate epithelial erosions, irregualr epi surface, decreased TBUT   Anterior Chamber Deep and quiet Deep and quiet   Iris Round and dilated Round and dilated   Lens PCIOL; open PC PCIOL; open PC   Anterior Vitreous syneresis, Posterior vitreous detachment, vitreous condensations inferiorly syneresis, Posterior vitreous detachment         Fundus Exam       Right Left   Disc Superior hyperemia, mild Pallor Pink and Sharp   C/D Ratio 0.6 0.5   Macula Flat, Blunted foveal reflex, +cystic changes - decreased, +Epiretinal membrane, minimal MA, PED flat; good foveal reflex, no heme or edema, small pigment clump IT to fovea   Vessels attenuated, Tortuous attenuated, Tortuous   Periphery Attached; 3 focal DBH temporal periphery Attached, no heme           IMAGING AND PROCEDURES  Imaging and Procedures for 04/25/17  OCT, Retina - OU - Both Eyes        Right Eye Quality was good. Central Foveal Thickness: 352. Progression has improved. Findings include no SRF, abnormal foveal contour, epiretinal membrane, intraretinal fluid (Interval improvement in IRF/central edema).   Left Eye Quality was good. Central Foveal Thickness: 282. Progression has been stable. Findings include normal foveal contour, no IRF, no SRF (Trace ERM).   Notes *Images captured and stored on drive  Diagnosis / Impression:  OD: BRVO w/ CME - Interval improvement in IRF/central edema OS: NFP; no IRF/SRF--stable, trace ERM  Clinical management:  See below  Abbreviations: NFP - Normal foveal profile. CME - cystoid macular edema. PED - pigment epithelial detachment. IRF - intraretinal fluid. SRF - subretinal fluid. EZ - ellipsoid zone. ERM - epiretinal membrane. ORA - outer retinal atrophy. ORT - outer retinal tubulation. SRHM - subretinal hyper-reflective material      Intravitreal Injection, Pharmacologic Agent - OD - Right Eye       Time Out 09/08/2021. 3:26 PM. Confirmed correct patient, procedure, site, and patient consented.   Anesthesia Topical anesthesia was used. Anesthetic  medications included Lidocaine 2%, Proparacaine 0.5%.   Procedure Preparation included 5% betadine to ocular surface, eyelid speculum. A (32 g) needle was used.   Injection: 2 mg aflibercept 2 MG/0.05ML   Route: Intravitreal, Site: Right Eye   NDC: L6038910, Lot: [id number], Expiration date: 11/03/2022, Waste: 0 mL   Post-op Post injection exam found visual acuity of at least counting fingers. The patient tolerated the procedure well. There were no complications. The patient received written and verbal post procedure care education. Post injection medications were not given.            ASSESSMENT/PLAN:    ICD-10-CM   1. Branch retinal vein occlusion of right eye with macular edema  H34.8310 OCT, Retina - OU - Both Eyes    Intravitreal Injection, Pharmacologic Agent -  OD - Right Eye    aflibercept (EYLEA) SOLN 2 mg    2. Both eyes affected by mild nonproliferative diabetic retinopathy with macular edema, associated with type 2 diabetes mellitus (HCC)  G40.1027     3. Essential hypertension  I10     4. Hypertensive retinopathy of both eyes  H35.033     5. Epiretinal membrane (ERM) of right eye  H35.371     6. Pseudophakia of both eyes  Z96.1      1. BRVO w/ CME OD  - by history, pt states symptoms first noticed 2 wks prior to presentation, but reports changes may have occurred prior  - initial exam with differential tortuosity of vessels (OD > OS)  - FA (02.10.20) shows mild late staining / leakage in macula, staining / leakage of disc -- improving CME  - differential includes DM2 (DME), hypertensive retinopathy, inflammatory etiology / uveitis  - S/P IVA OD #1 (02.08.19), #2 (03.11.19), #3 (04.09.19), #4 (05.20.19), #5 (02.10.20)  - gave IVA OD on 2.10.20 due to pending Eylea4U for 2020 -- resulted in increased IRF/CME  - review of OCTs show persistent IRF and cystic changes --  resistance to IVA   - June 2019 -- switched therapies: S/P IVE OD #1 (06.24.19), #2 (07.24.19), #3 (09.04.19), #4 (10.30.19),#5 (12.30.19), #6 (03.23.20), #7 (05.05.20), #8 (07.16.20), #9 (07.17.20), #10 (08.28.20), #11 (10.13.20), # 12 (11.17.20), #13 (2.8.21), #14 (03.09.21), #15 (04.13.21), #16 (05.11.21), #17 (06.17.21), #18 (07.23.21), #19 (08.30.21), #20 (10.04.21), #21 (11.08.21), #22 (12.08.21), #23 (01.31.22), #24 (02.28.22), #25 (04.01.22), #26 (06.15.22), #27 (07.13.22), #28 (08.17.22), #29 (09.21.22), #30 (10.19.22), #31 (11.16.22), #32 (12.16.22), #33 (01.13.23), #34 (02.10.23), #35 (03.15.23), #36 (04.14.23), #37 (05.15.23), #38 (06.12.23), #39 (07.10.23), #40 (08.07.23)  - OCT today shows interval decrease of central IRF/edema at 4 weeks  - BCVA 20/40-1 -- stable  - Eylea4U benefits investigation completed and pt approved for IVE for 2023  - recommend IVE OD #41  today, 09.06.23 w/ f/u in 4-5 wks  - RBA of procedure discussed, questions answered  - informed consent obtained  - see procedure note  - Eylea informed consent form obtained, re-signed and scanned on 05.15.23  - f/u 4-5 weeks  -- DFE/OCT/possible injection  2. Mild nonproliferative diabetic retinopathy, both eyes  - A1c 6.6 on 07.01.23, 6.0 on 10.14.22  - could be contributing to CME OD  - OS with minimal diabetic retinopathy  - continue to monitor  3,4. Hypertensive retinopathy OU - stable  - as above, may have contributing to CME OD  - discussed importance of tight BP control  - monitor  5. Epiretinal membrane, right eye   - stable nasal ERM  - no indication  for surgery at this time  6. Pseudophakia OU  - s/p CE/IOL OU by [facility] in [facility]  - doing well  - monitor  Ophthalmic Meds Ordered this visit:  Meds ordered this encounter  Medications   aflibercept (EYLEA) SOLN 2 mg     Return 4-5 weeks, for DFE, OCT.  This document serves as a record of services personally performed by Gardiner Sleeper, MD, PhD. It was created on their behalf by Orvan Falconer, an ophthalmic technician. The creation of this record is the provider's dictation and/or activities during the visit.    Electronically signed by: Orvan Falconer, OA, 09/08/21  9:23 PM  This document serves as a record of services personally performed by Gardiner Sleeper, MD, PhD. It was created on their behalf by Roselee Nova, COMT. The creation of this record is the provider's dictation and/or activities during the visit.  Electronically signed by: Roselee Nova, COMT 09/08/21 9:23 PM  Gardiner Sleeper, M.D., Ph.D. Diseases & Surgery of the Retina and Vitreous [facility]  I have reviewed the above documentation for accuracy and completeness, and I agree with the above. Gardiner Sleeper, M.D., Ph.D. 09/08/21 9:24 PM   Abbreviations: M myopia (nearsighted); A astigmatism; H hyperopia  (farsighted); P presbyopia; Mrx spectacle prescription;  CTL contact lenses; OD right eye; OS left eye; OU both eyes  XT exotropia; ET esotropia; PEK punctate epithelial keratitis; PEE punctate epithelial erosions; DES dry eye syndrome; MGD meibomian gland dysfunction; ATs artificial tears; PFAT's preservative free artificial tears; [place] nuclear sclerotic cataract; PSC posterior subcapsular cataract; ERM epi-retinal membrane; PVD posterior vitreous detachment; RD retinal detachment; DM diabetes mellitus; DR diabetic retinopathy; NPDR non-proliferative diabetic retinopathy; PDR proliferative diabetic retinopathy; CSME clinically significant macular edema; DME diabetic macular edema; dbh dot blot hemorrhages; CWS cotton wool spot; POAG primary open angle glaucoma; C/D cup-to-disc ratio; HVF humphrey visual field; GVF goldmann visual field; OCT optical coherence tomography; IOP intraocular pressure; BRVO Branch retinal vein occlusion; CRVO central retinal vein occlusion; CRAO central retinal artery occlusion; BRAO branch retinal artery occlusion; RT retinal tear; SB scleral buckle; PPV pars plana vitrectomy; VH Vitreous hemorrhage; PRP panretinal laser photocoagulation; IVK intravitreal kenalog; VMT vitreomacular traction; MH Macular hole;  NVD neovascularization of the disc; NVE neovascularization elsewhere; AREDS age related eye disease study; ARMD age related macular degeneration; POAG primary open angle glaucoma; EBMD epithelial/anterior basement membrane dystrophy; ACIOL anterior chamber intraocular lens; IOL intraocular lens; PCIOL posterior chamber intraocular lens; Phaco/IOL phacoemulsification with intraocular lens placement; [place] photorefractive keratectomy; LASIK laser assisted in situ keratomileusis; HTN hypertension; DM diabetes mellitus; COPD chronic obstructive pulmonary disease

## 2021-09-08 ENCOUNTER — Ambulatory Visit (INDEPENDENT_AMBULATORY_CARE_PROVIDER_SITE_OTHER): Payer: Medicare Other | Admitting: Ophthalmology

## 2021-09-08 ENCOUNTER — Encounter (INDEPENDENT_AMBULATORY_CARE_PROVIDER_SITE_OTHER): Payer: Self-pay | Admitting: Ophthalmology

## 2021-09-08 DIAGNOSIS — H35033 Hypertensive retinopathy, bilateral: Secondary | ICD-10-CM | POA: Diagnosis not present

## 2021-09-08 DIAGNOSIS — E113213 Type 2 diabetes mellitus with mild nonproliferative diabetic retinopathy with macular edema, bilateral: Secondary | ICD-10-CM | POA: Diagnosis not present

## 2021-09-08 DIAGNOSIS — I10 Essential (primary) hypertension: Secondary | ICD-10-CM | POA: Diagnosis not present

## 2021-09-08 DIAGNOSIS — H34831 Tributary (branch) retinal vein occlusion, right eye, with macular edema: Secondary | ICD-10-CM | POA: Diagnosis not present

## 2021-09-08 DIAGNOSIS — Z961 Presence of intraocular lens: Secondary | ICD-10-CM

## 2021-09-08 DIAGNOSIS — H35371 Puckering of macula, right eye: Secondary | ICD-10-CM

## 2021-09-08 MED ORDER — AFLIBERCEPT 2MG/0.05ML IZ SOLN FOR KALEIDOSCOPE
2.0000 mg | INTRAVITREAL | Status: AC | PRN
  Administered 2021-09-08: 2 mg via INTRAVITREAL

## 2021-09-22 ENCOUNTER — Ambulatory Visit: Payer: PRIVATE HEALTH INSURANCE

## 2021-09-22 ENCOUNTER — Other Ambulatory Visit: Payer: PRIVATE HEALTH INSURANCE

## 2021-09-22 ENCOUNTER — Ambulatory Visit: Payer: PRIVATE HEALTH INSURANCE | Admitting: Internal Medicine

## 2021-10-04 MED FILL — Iron Sucrose Inj 20 MG/ML (Fe Equiv): INTRAVENOUS | Qty: 10 | Status: AC

## 2021-10-05 ENCOUNTER — Inpatient Hospital Stay (HOSPITAL_BASED_OUTPATIENT_CLINIC_OR_DEPARTMENT_OTHER): Payer: Medicare Other | Admitting: Internal Medicine

## 2021-10-05 ENCOUNTER — Inpatient Hospital Stay: Payer: Medicare Other | Attending: Internal Medicine

## 2021-10-05 ENCOUNTER — Encounter: Payer: Self-pay | Admitting: Internal Medicine

## 2021-10-05 ENCOUNTER — Inpatient Hospital Stay: Payer: Medicare Other

## 2021-10-05 VITALS — BP 136/61 | HR 72

## 2021-10-05 DIAGNOSIS — D631 Anemia in chronic kidney disease: Secondary | ICD-10-CM | POA: Diagnosis present

## 2021-10-05 DIAGNOSIS — E611 Iron deficiency: Secondary | ICD-10-CM | POA: Diagnosis not present

## 2021-10-05 DIAGNOSIS — D649 Anemia, unspecified: Secondary | ICD-10-CM

## 2021-10-05 DIAGNOSIS — Z7901 Long term (current) use of anticoagulants: Secondary | ICD-10-CM | POA: Insufficient documentation

## 2021-10-05 DIAGNOSIS — E11319 Type 2 diabetes mellitus with unspecified diabetic retinopathy without macular edema: Secondary | ICD-10-CM | POA: Insufficient documentation

## 2021-10-05 DIAGNOSIS — I739 Peripheral vascular disease, unspecified: Secondary | ICD-10-CM | POA: Insufficient documentation

## 2021-10-05 DIAGNOSIS — N183 Chronic kidney disease, stage 3 unspecified: Secondary | ICD-10-CM | POA: Diagnosis not present

## 2021-10-05 LAB — CBC WITH DIFFERENTIAL/PLATELET
Abs Immature Granulocytes: 0.05 10*3/uL (ref 0.00–0.07)
Basophils Absolute: 0.1 10*3/uL (ref 0.0–0.1)
Basophils Relative: 1 %
Eosinophils Absolute: 0.2 10*3/uL (ref 0.0–0.5)
Eosinophils Relative: 2 %
HCT: 25 % — ABNORMAL LOW (ref 36.0–46.0)
Hemoglobin: 8.2 g/dL — ABNORMAL LOW (ref 12.0–15.0)
Immature Granulocytes: 0 %
Lymphocytes Relative: 14 %
Lymphs Abs: 1.8 10*3/uL (ref 0.7–4.0)
MCH: 28.2 pg (ref 26.0–34.0)
MCHC: 32.8 g/dL (ref 30.0–36.0)
MCV: 85.9 fL (ref 80.0–100.0)
Monocytes Absolute: 1.1 10*3/uL — ABNORMAL HIGH (ref 0.1–1.0)
Monocytes Relative: 9 %
Neutro Abs: 9.7 10*3/uL — ABNORMAL HIGH (ref 1.7–7.7)
Neutrophils Relative %: 74 %
Platelets: 381 10*3/uL (ref 150–400)
RBC: 2.91 MIL/uL — ABNORMAL LOW (ref 3.87–5.11)
RDW: 16.5 % — ABNORMAL HIGH (ref 11.5–15.5)
WBC: 12.9 10*3/uL — ABNORMAL HIGH (ref 4.0–10.5)
nRBC: 0.2 % (ref 0.0–0.2)

## 2021-10-05 LAB — BASIC METABOLIC PANEL
Anion gap: 7 (ref 5–15)
BUN: 43 mg/dL — ABNORMAL HIGH (ref 8–23)
CO2: 21 mmol/L — ABNORMAL LOW (ref 22–32)
Calcium: 9.3 mg/dL (ref 8.9–10.3)
Chloride: 108 mmol/L (ref 98–111)
Creatinine, Ser: 1.52 mg/dL — ABNORMAL HIGH (ref 0.44–1.00)
GFR, Estimated: 35 mL/min — ABNORMAL LOW (ref >60)
Glucose, Bld: 167 mg/dL — ABNORMAL HIGH (ref 70–99)
Potassium: 4.5 mmol/L (ref 3.5–5.1)
Sodium: 136 mmol/L (ref 135–145)

## 2021-10-05 MED ORDER — SODIUM CHLORIDE 0.9 % IV SOLN
200.0000 mg | Freq: Once | INTRAVENOUS | Status: AC
  Administered 2021-10-05: 200 mg via INTRAVENOUS
  Filled 2021-10-05: qty 10

## 2021-10-05 MED ORDER — SODIUM CHLORIDE 0.9 % IV SOLN
Freq: Once | INTRAVENOUS | Status: AC
  Filled 2021-10-05: qty 250

## 2021-10-05 MED ORDER — HEPARIN SOD (PORK) LOCK FLUSH 100 UNIT/ML IV SOLN
500.0000 [IU] | Freq: Once | INTRAVENOUS | Status: AC | PRN
  Administered 2021-10-05: 500 [IU]
  Filled 2021-10-05: qty 5

## 2021-10-05 NOTE — Assessment & Plan Note (Addendum)
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  Improved s/p IV iron infusions however today- Hb 8.2 Proceed with Venofer today.  # Discussed use of erythropoietin stimulating agents like retacrit to stimulate the bone marrow.  Discussed the potential issues with erythropoietin estimating agents-given the risk of stroke thromboembolic events/elevated blood pressure.  However, most of the serious events did not happen when the goal hematocrit is 33/hemoglobin 30.  Start Retacrit this week; every other week.  Recommend holding off bone marrow biopsy at this time.  Check LDH haptoglobin next visit.  # Etiology-CKD-III; GFR ~35 overall -stable [Dr.Kolluru]; recommend continued hydration.  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   #Poor IV access/Mediport placement-stable  # DISPOSITION:  # Venofer today; NO retacrit  Schedule retacrit/venofer on separate days  # retacrit this week; and then every 2 weeks- lab- H&H; possible retacrit  # venofer  Weekly x3    # follow up 6 weeks MD; port; labs- cbc/bmp; LDH; Haptoglobin-;possible retacrit-venofer- Dr.B

## 2021-10-05 NOTE — Progress Notes (Signed)
Patient would like to discuss other possibilities of treating anemia.  Feels anemia not well controlled with the iron infusions.

## 2021-10-05 NOTE — Progress Notes (Signed)
[facility] NOTE  Patient Care Team: Rusty Aus, MD as PCP - General (Internal Medicine) Josefine Class, MD as Referring Physician (Gastroenterology) Cammie Sickle, MD as Consulting Physician (Hematology and Oncology)  CHIEF COMPLAINTS/PURPOSE OF CONSULTATION: Anemia  HEMATOLOGY HISTORY  # ANEMIA- Jan 2021- 8.8/ferritin 11 [PCP]; N-WBC/platelets? IDA vs other- EGD-2015/colonoscopy-? 2015; 2020- [Dr.Skulskie] ; capsule-2016- ? 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   HISTORY OF PRESENTING ILLNESS: Ambulating independently.  Alone.  Cheryl Leonard 76 y.o.  female anemia iron deficiency-question CKD-III  here for follow-up.   In the interim patient received IV iron infusions x3 the last 3 months.  Hemoglobin today is 8.2.   Patient would like to discuss other possibilities of treating anemia.  Feels anemia not well controlled with the iron infusions.  Patient continues to complain of moderate to severe fatigue denies any blood in stools or black-colored stools.  Review of Systems  Constitutional:  Positive for malaise/fatigue. Negative for chills, diaphoresis and fever.  HENT:  Negative for nosebleeds and sore throat.   Eyes:  Negative for double vision.  Respiratory:  Positive for shortness of breath. Negative for cough, hemoptysis, sputum production and wheezing.   Cardiovascular:  Negative for chest pain, palpitations, orthopnea and leg swelling.  Gastrointestinal:  Negative for abdominal pain, blood in stool, constipation, diarrhea, heartburn, melena, nausea and vomiting.  Genitourinary:  Negative for dysuria, frequency and urgency.  Musculoskeletal:  Positive for joint pain.  Skin: Negative.  Negative for itching and rash.  Neurological:  Negative for dizziness,  tingling, focal weakness, weakness and headaches.  Endo/Heme/Allergies:  Does not bruise/bleed easily.  Psychiatric/Behavioral:  Negative for depression. The patient is not nervous/anxious and does not have insomnia.     MEDICAL HISTORY:  Past Medical History:  Diagnosis Date  . Anemia   . Anxiety   . Arthritis    Gout  . Cataracts, both eyes   . Diabetic retinopathy ([place])    NPDR OU  . Diverticulitis   . GERD (gastroesophageal reflux disease)   . Gout   . Headache    h/o migraines  . History of fracture of patella    right knee  . History of positive PPD    Patient always shows positive  . Hyperlipidemia   . Hypertension   . Hypertensive retinopathy    OU  . Hypothyroidism   . Lichen sclerosus 27/51/7001   of vulva  . Metatarsal fracture   . Neuropathy   . Osteopenia   . Peripheral vascular disease ([place])   . Polyneuropathy    numbness and tingling in feet and toes  . Renal insufficiency    Stage 3  . Sleep apnea    does not use cpap-lost weight   . Type 2 diabetes mellitus, uncontrolled     SURGICAL HISTORY: Past Surgical History:  Procedure Laterality Date  . ABDOMINAL HYSTERECTOMY    . AMPUTATION TOE Right 05/08/2020   Procedure: AMPUTATION TOE-Right 4th Toe;  Surgeon: Caroline More, DPM;  Location: [facility];  Service: Podiatry;  Laterality: Right;  . AMPUTATION TOE Left 04/22/2021   Procedure: AMPUTATION TOE - 4TH METARSOPHANGEAL JOINT;  Surgeon: Caroline More, DPM;  Location: [facility];  Service: Podiatry;  Laterality: Left;  . APPENDECTOMY    . BREAST REDUCTION SURGERY  2001  .  CATARACT EXTRACTION    . CESAREAN SECTION  1976  . COLONOSCOPY  03/05/2013   Nml - due for repeat 03/06/2018  . COLONOSCOPY WITH PROPOFOL N/A 03/18/2019   Procedure: COLONOSCOPY WITH PROPOFOL;  Surgeon: Toledo, Benay Pike, MD;  Location: [facility];  Service: Gastroenterology;  Laterality: N/A;  . DIAGNOSTIC LAPAROSCOPY    . DILATION AND CURETTAGE OF UTERUS  1989  . ENDARTERECTOMY  FEMORAL Bilateral 03/09/2018   Procedure: ENDARTERECTOMY FEMORAL;  Surgeon: Algernon Huxley, MD;  Location: [facility];  Service: Vascular;  Laterality: Bilateral;  . ENDARTERECTOMY POPLITEAL Left 03/09/2018   Procedure: ENDARTERECTOMY POPLITEAL AND SFA;  Surgeon: Algernon Huxley, MD;  Location: [facility];  Service: Vascular;  Laterality: Left;  . ESOPHAGOGASTRODUODENOSCOPY  03/05/2013  . ESOPHAGOGASTRODUODENOSCOPY (EGD) WITH PROPOFOL N/A 03/18/2019   Procedure: ESOPHAGOGASTRODUODENOSCOPY (EGD) WITH PROPOFOL;  Surgeon: Toledo, Benay Pike, MD;  Location: [facility];  Service: Gastroenterology;  Laterality: N/A;  . EYE SURGERY    . Eyelid Surgery  2012  . INTRAMEDULLARY (IM) NAIL INTERTROCHANTERIC Left 10/30/2015   Procedure: INTRAMEDULLARY (IM) NAIL INTERTROCHANTRIC ;  Surgeon: Hessie Knows, MD;  Location: [facility];  Service: Orthopedics;  Laterality: Left;  . KYPHOPLASTY N/A 10/25/2018   Procedure: L4 KYPHOPLASTY;  Surgeon: Hessie Knows, MD;  Location: [facility];  Service: Orthopedics;  Laterality: N/A;  . LAPAROSCOPIC HYSTERECTOMY  2000   total  . LOWER EXTREMITY ANGIOGRAPHY Left 03/08/2017   Procedure: LOWER EXTREMITY ANGIOGRAPHY;  Surgeon: Algernon Huxley, MD;  Location: [place] [facility];  Service: Cardiovascular;  Laterality: Left;  . LOWER EXTREMITY ANGIOGRAPHY Left 10/30/2017   Procedure: LOWER EXTREMITY ANGIOGRAPHY;  Surgeon: Algernon Huxley, MD;  Location: [place] [facility];  Service: Cardiovascular;  Laterality: Left;  . LOWER EXTREMITY ANGIOGRAPHY Right 03/08/2018   Procedure: LOWER EXTREMITY ANGIOGRAPHY;  Surgeon: Algernon Huxley, MD;  Location: [place] [facility];  Service: Cardiovascular;  Laterality: Right;  . LOWER EXTREMITY ANGIOGRAPHY Left 10/01/2018   Procedure: LOWER EXTREMITY ANGIOGRAPHY;  Surgeon: Algernon Huxley, MD;  Location: [place] [facility];  Service: Cardiovascular;  Laterality: Left;  . LOWER EXTREMITY ANGIOGRAPHY Right 10/08/2018   Procedure: LOWER EXTREMITY ANGIOGRAPHY;  Surgeon:  Algernon Huxley, MD;  Location: [place] [facility];  Service: Cardiovascular;  Laterality: Right;  . LOWER EXTREMITY ANGIOGRAPHY Right 05/07/2020   Procedure: Lower Extremity Angiography;  Surgeon: Algernon Huxley, MD;  Location: [facility] [facility];  Service: Cardiovascular;  Laterality: Right;  . LYSIS OF ADHESION  01/25/2021   Procedure: LYSIS OF ADHESION;  Surgeon: Ronny Bacon, MD;  Location: [facility];  Service: General;;  . PERIPHERAL VASCULAR INTERVENTION  03/08/2018   Procedure: PERIPHERAL VASCULAR INTERVENTION;  Surgeon: Algernon Huxley, MD;  Location: [place] [facility];  Service: Cardiovascular;;  . PORTA CATH INSERTION N/A 02/17/2020   Procedure: PORTA CATH INSERTION;  Surgeon: Algernon Huxley, MD;  Location: [place] [facility];  Service: Cardiovascular;  Laterality: N/A;  . REDUCTION MAMMAPLASTY  1997  . SACROPLASTY N/A 10/25/2018   Procedure: S1 SACROPLASTY;  Surgeon: Hessie Knows, MD;  Location: [facility];  Service: Orthopedics;  Laterality: N/A;    SOCIAL HISTORY: Social History   Socioeconomic History  . Marital status: Married    Spouse name: Jenny Reichmann  . Number of children: 3  . Years of education: Not on file  . Highest education level: Not on file  Occupational History  . Occupation: Retail banker  Tobacco Use  . Smoking status: Former  Packs/day: 1.00    Years: 20.00    Total pack years: 20.00    Types: Cigarettes    Quit date: 03/07/1996    Years since quitting: 25.5    Passive exposure: Past  . Smokeless tobacco: Never  . Tobacco comments:    started smoking at age 89 but stopped smoking in 2000  Vaping Use  . Vaping Use: Never used  Substance and Sexual Activity  . Alcohol use: No    Alcohol/week: 0.0 standard drinks of alcohol  . Drug use: No  . Sexual activity: Yes    Partners: Male    Birth control/protection: Surgical  Other Topics Concern  . Not on file  Social History Narrative   Lives in [place]; with husband; quit > 20 years; no alcohol;  used to work at [employer]  at [employer].    Social Determinants of Health   Financial Resource Strain: Low Risk  (10/08/2018)   Overall Financial Resource Strain (CARDIA)   . Difficulty of Paying Living Expenses: Not very hard  Food Insecurity: No Food Insecurity (10/08/2018)   Hunger Vital Sign   . Worried About Charity fundraiser in the Last Year: Never true   . Ran Out of Food in the Last Year: Never true  Transportation Needs: Unknown (10/01/2018)   PRAPARE - Transportation   . Lack of Transportation (Medical): No   . Lack of Transportation (Non-Medical): Not on file  Physical Activity: Unknown (10/01/2018)   Exercise Vital Sign   . Days of Exercise per Week: 2 days   . Minutes of Exercise per Session: Not on file  Stress: Stress Concern Present (10/01/2018)   [place]   . Feeling of Stress : To some extent  Social Connections: Unknown (10/08/2018)   Social Connection and Isolation Panel [NHANES]   . Frequency of Communication with Friends and Family: More than three times a week   . Frequency of Social Gatherings with Friends and Family: Not on file   . Attends Religious Services: Not on file   . Active Member of Clubs or Organizations: Not on file   . Attends Archivist Meetings: Not on file   . Marital Status: Married  Human resources officer Violence: Not At Risk (10/08/2018)   Humiliation, Afraid, Rape, and Kick questionnaire   . Fear of Current or Ex-Partner: No   . Emotionally Abused: No   . Physically Abused: No   . Sexually Abused: No    FAMILY HISTORY: Family History  Problem Relation Age of Onset  . Coronary artery disease Father   . Heart attack Father   . Coronary artery disease Mother   . Heart attack Mother   . Ovarian cancer Sister 4       sister had hormonal therapy for IVF txs-which increased risk factor for ovarian cancer  . Breast cancer Neg Hx     ALLERGIES:  has No Known  Allergies.  MEDICATIONS:  Current Outpatient Medications  Medication Sig Dispense Refill  . ALPRAZolam (XANAX) 0.25 MG tablet Take 0.25 mg by mouth daily as needed for anxiety.    [name] amLODipine (NORVASC) 5 MG tablet Take 5 mg by mouth 2 (two) times daily.    [name] aspirin EC 81 MG tablet Take 1 tablet (81 mg total) by mouth daily. 30 tablet 11  . cholecalciferol (VITAMIN D) 1000 units tablet Take 1,000 Units by mouth 2 (two) times daily.    [name] CORAL CALCIUM PO  Take 1 tablet by mouth daily.    [name] denosumab (PROLIA) 60 MG/ML SOLN injection Inject 60 mg into the skin every 6 (six) months.     . escitalopram (LEXAPRO) 10 MG tablet Take 10 mg by mouth daily.    [name] estradiol (ESTRACE) 0.1 MG/GM vaginal cream Place 1 Applicatorful vaginally daily as needed (vaginal irritation).    . famotidine (PEPCID) 40 MG tablet Take 40 mg by mouth daily.    . furosemide (LASIX) 20 MG tablet Take 20 mg by mouth every Wednesday.    . gabapentin (NEURONTIN) 300 MG capsule Take 300 mg by mouth at bedtime as needed (pain).    [name] HYDROcodone-acetaminophen (NORCO/VICODIN) 5-325 MG tablet Take 1 tablet by mouth daily.    [name] levothyroxine (SYNTHROID, LEVOTHROID) 100 MCG tablet Take 100 mcg by mouth daily before breakfast.   3  . lidocaine-prilocaine (EMLA) cream Apply 1 application topically as needed (apply prior to port a cath access). 30 g 3  . Magnesium 500 MG TABS Take 500 mg by mouth every morning.     . metFORMIN (GLUCOPHAGE) 1000 MG tablet Take 1,000 mg by mouth 2 (two) times daily with a meal.    . metoprolol succinate (TOPROL-XL) 50 MG 24 hr tablet Take 50 mg by mouth daily. Take with or immediately following a meal.    . mirtazapine (REMERON) 15 MG tablet Take 15 mg by mouth as needed.    . Multiple Vitamin (MULTIVITAMIN WITH MINERALS) TABS tablet Take 1 tablet by mouth daily. Centrum Silver    . nystatin cream (MYCOSTATIN) Apply 1 application topically daily as needed (Yeast infection).    [name] olmesartan (BENICAR) 20 MG  tablet Take 20 mg by mouth daily.    . pantoprazole (PROTONIX) 40 MG tablet Take 40 mg by mouth daily.    . rivaroxaban (XARELTO) 20 MG TABS tablet Take 1 tablet (20 mg total) by mouth daily with supper. 90 tablet 3  . rosuvastatin (CRESTOR) 20 MG tablet Take 20 mg by mouth every morning.    . TRESIBA FLEXTOUCH 200 UNIT/ML SOPN Inject 25-30 Units as directed at bedtime. Titrate according to fasting blood glucose not to exceed 50 units a day  5  . vitamin B-12 (CYANOCOBALAMIN) 1000 MCG tablet Take 1,000 mcg by mouth daily.    . vitamin C (ASCORBIC ACID) 250 MG tablet Take 250 mg by mouth daily.    . vitamin E 400 UNIT capsule Take 400 Units by mouth daily.    [name] zolpidem (AMBIEN) 10 MG tablet Take 10 mg by mouth at bedtime.    . pantoprazole sodium (PROTONIX) 40 mg Take 40 mg by mouth every morning.      No current facility-administered medications for this visit.   Facility-Administered Medications Ordered in Other Visits  Medication Dose Route Frequency Provider Last Rate Last Admin  . heparin lock flush 100 unit/mL  500 Units Intracatheter Once PRN Cammie Sickle, MD          PHYSICAL EXAMINATION:   Vitals:   10/05/21 1400  BP: (!) 147/70  Pulse: 74  Resp: 18  Temp: 98.8 F (37.1 C)  SpO2: 98%   Filed Weights   10/05/21 1400  Weight: 162 lb 6.4 oz (73.7 kg)    Physical Exam Constitutional:      Comments: Alone.  Ambulating independently.  HENT:     Head: Normocephalic and atraumatic.     Mouth/Throat:     Pharynx: No oropharyngeal exudate.  Eyes:  Pupils: Pupils are equal, round, and reactive to light.  Cardiovascular:     Rate and Rhythm: Normal rate and regular rhythm.  Pulmonary:     Effort: Pulmonary effort is normal. No respiratory distress.     Breath sounds: Normal breath sounds. No wheezing.  Abdominal:     General: Bowel sounds are normal. There is no distension.     Palpations: Abdomen is soft. There is no mass.     Tenderness: There is no  abdominal tenderness. There is no guarding or rebound.  Musculoskeletal:        General: No tenderness. Normal range of motion.     Cervical back: Normal range of motion and neck supple.  Skin:    General: Skin is warm.  Neurological:     Mental Status: She is alert and oriented to person, place, and time.  Psychiatric:        Mood and Affect: Affect normal.    LABORATORY DATA:  I have reviewed the data as listed Lab Results  Component Value Date   WBC 12.9 (H) 10/05/2021   HGB 8.2 (L) 10/05/2021   HCT 25.0 (L) 10/05/2021   MCV 85.9 10/05/2021   PLT 381 10/05/2021   Recent Labs    10/16/20 1329 01/11/21 1317 01/25/21 1349 04/14/21 1135 07/12/21 0900 10/05/21 1426  NA 133* 134*   < > 138 134* 136  K 5.1 4.6   < > 4.3 4.0 4.5  CL 105 102   < > 105 102 108  CO2 21* 24   < > 24 22 21*  GLUCOSE 195* 279*   < > 165* 216* 167*  BUN 38* 34*   < > 23 37* 43*  CREATININE 1.94* 1.33*   < > 1.39* 1.37* 1.52*  CALCIUM 9.2 9.7   < > 9.9 9.4 9.3  GFRNONAA 27* 42*   < > 39* 40* 35*  PROT 6.8 7.1  --   --   --   --   ALBUMIN 3.6 3.7  --   --   --   --   AST 22 20  --   --   --   --   ALT 19 18  --   --   --   --   ALKPHOS 46 63  --   --   --   --   BILITOT 0.6 0.6  --   --   --   --    < > = values in this interval not displayed.     Intravitreal Injection, Pharmacologic Agent - OD - Right Eye  Result Date: 09/08/2021 Time Out 09/08/2021. 3:26 PM. Confirmed correct patient, procedure, site, and patient consented. Anesthesia Topical anesthesia was used. Anesthetic medications included Lidocaine 2%, Proparacaine 0.5%. Procedure Preparation included 5% betadine to ocular surface, eyelid speculum. A (32 g) needle was used. Injection: 2 mg aflibercept 2 MG/0.05ML   Route: Intravitreal, Site: Right Eye   NDC: [id number], Lot: [id number], Expiration date: 11/03/2022, Waste: 0 mL Post-op Post injection exam found visual acuity of at least counting fingers. The patient tolerated the procedure  well. There were no complications. The patient received written and verbal post procedure care education. Post injection medications were not given.   OCT, Retina - OU - Both Eyes  Result Date: 09/08/2021 Right Eye Quality was good. Central Foveal Thickness: 352. Progression has improved. Findings include no SRF, abnormal foveal contour, epiretinal membrane, intraretinal fluid (Interval improvement in IRF/central edema). Left Eye Quality  was good. Central Foveal Thickness: 282. Progression has been stable. Findings include normal foveal contour, no IRF, no SRF (Trace ERM). Notes *Images captured and stored on drive Diagnosis / Impression: OD: BRVO w/ CME - Interval improvement in IRF/central edema OS: NFP; no IRF/SRF--stable, trace ERM Clinical management: See below Abbreviations: NFP - Normal foveal profile. CME - cystoid macular edema. PED - pigment epithelial detachment. IRF - intraretinal fluid. SRF - subretinal fluid. EZ - ellipsoid zone. ERM - epiretinal membrane. ORA - outer retinal atrophy. ORT - outer retinal tubulation. 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  Improved s/p IV iron infusions however today- Hb 8.2 Proceed with Venofer today.  # Discussed use of erythropoietin stimulating agents like retacrit to stimulate the bone marrow.  Discussed the potential issues with erythropoietin estimating agents-given the risk of stroke thromboembolic events/elevated blood pressure.  However, most of the serious events did not happen when the goal hematocrit is 33/hemoglobin 30.  Start Retacrit this week; every other week.  Recommend holding off bone marrow biopsy at this time.  Check LDH haptoglobin next visit.  # Etiology-CKD-III; GFR ~35 overall -stable [Dr.Kolluru]; recommend continued hydration.  # #Diabetes/complications [id number] stable; Gangrene Right toes s/p amputation [May 2022]- on xarelto;  s/p  left toe amputation/awaiting evaluation with vascular, Dr.Dew.   #Poor IV access/Mediport placement-stable  # DISPOSITION:  # Venofer today; NO retacrit  Schedule retacrit/venofer on separate days  # retacrit this week; and then every 2 weeks- lab- H&H; possible retacrit  # venofer  Weekly x3    # follow up 6 weeks MD; port; labs- cbc/bmp; LDH; Haptoglobin-;possible retacrit-venofer- Dr.B    All questions were answered. The patient knows to call the clinic with any problems, questions or concerns.    Cammie Sickle, MD 10/05/2021 3:58 PM

## 2021-10-05 NOTE — Progress Notes (Signed)
[facility] Note  10/06/2021     CHIEF COMPLAINT Patient presents for Retina Follow Up   HISTORY OF PRESENT ILLNESS: Cheryl Leonard is a 76 y.o. female who presents to the clinic today for:    HPI     Retina Follow Up   Patient presents with  CRVO/BRVO.  In right eye.  This started 4 weeks ago.  I, the attending physician,  performed the HPI with the patient and updated documentation appropriately.        Comments   Patient here for 4 weeks retina follow up for BRVO OD. Patient states vision fine. The same as usual. No eye pain.       Last edited by Bernarda Caffey, MD on 10/06/2021  5:17 PM.    Patient states no changes in vision. Starting hormone therapy for anemia.    Referring physician: Rusty Aus, MD [place],  [place] [address]   HISTORICAL INFORMATION:   Selected notes from the MEDICAL RECORD NUMBER Referred by Dr. Marvel Plan for concern of DME OD Lab Results  Component Value Date   HGBA1C 7.3 (H) 01/11/2021     CURRENT MEDICATIONS: No current outpatient medications on file. (Ophthalmic Drugs)   No current facility-administered medications for this visit. (Ophthalmic Drugs)   Current Outpatient Medications (Other)  Medication Sig   ALPRAZolam (XANAX) 0.25 MG tablet Take 0.25 mg by mouth daily as needed for anxiety.   amLODipine (NORVASC) 5 MG tablet Take 5 mg by mouth 2 (two) times daily.   aspirin EC 81 MG tablet Take 1 tablet (81 mg total) by mouth daily.   cholecalciferol (VITAMIN D) 1000 units tablet Take 1,000 Units by mouth 2 (two) times daily.   CORAL CALCIUM PO Take 1 tablet by mouth daily.   denosumab (PROLIA) 60 MG/ML SOLN injection Inject 60 mg into the skin every 6 (six) months.    escitalopram (LEXAPRO) 10 MG tablet Take 10 mg by mouth daily.   estradiol (ESTRACE) 0.1 MG/GM vaginal cream Place 1 Applicatorful vaginally daily as needed (vaginal  irritation).   famotidine (PEPCID) 40 MG tablet Take 40 mg by mouth daily.   furosemide (LASIX) 20 MG tablet Take 20 mg by mouth every Wednesday.   gabapentin (NEURONTIN) 300 MG capsule Take 300 mg by mouth at bedtime as needed (pain).   HYDROcodone-acetaminophen (NORCO/VICODIN) 5-325 MG tablet Take 1 tablet by mouth daily.   levothyroxine (SYNTHROID, LEVOTHROID) 100 MCG tablet Take 100 mcg by mouth daily before breakfast.    lidocaine-prilocaine (EMLA) cream Apply 1 application topically as needed (apply prior to port a cath access).   Magnesium 500 MG TABS Take 500 mg by mouth every morning.    metFORMIN (GLUCOPHAGE) 1000 MG tablet Take 1,000 mg by mouth 2 (two) times daily with a meal.   metoprolol succinate (TOPROL-XL) 50 MG 24 hr tablet Take 50 mg by mouth daily. Take with or immediately following a meal.   mirtazapine (REMERON) 15 MG tablet Take 15 mg by mouth as needed.   Multiple Vitamin (MULTIVITAMIN WITH MINERALS) TABS tablet Take 1 tablet by mouth daily. Centrum Silver   nystatin cream (MYCOSTATIN) Apply 1 application topically daily as needed (Yeast infection).   olmesartan (BENICAR) 20 MG tablet Take 20 mg by mouth daily.   pantoprazole (PROTONIX) 40 MG tablet Take 40 mg by mouth daily.   pantoprazole sodium (PROTONIX) 40 mg Take 40 mg by mouth every morning.  rivaroxaban (XARELTO) 20 MG TABS tablet Take 1 tablet (20 mg total) by mouth daily with supper.   rosuvastatin (CRESTOR) 20 MG tablet Take 20 mg by mouth every morning.   TRESIBA FLEXTOUCH 200 UNIT/ML SOPN Inject 25-30 Units as directed at bedtime. Titrate according to fasting blood glucose not to exceed 50 units a day   vitamin B-12 (CYANOCOBALAMIN) 1000 MCG tablet Take 1,000 mcg by mouth daily.   vitamin C (ASCORBIC ACID) 250 MG tablet Take 250 mg by mouth daily.   vitamin E 400 UNIT capsule Take 400 Units by mouth daily.   zolpidem (AMBIEN) 10 MG tablet Take 10 mg by mouth at bedtime.   No current facility-administered  medications for this visit. (Other)   REVIEW OF SYSTEMS: ROS   Positive for: Gastrointestinal, Musculoskeletal, Endocrine, Cardiovascular, Eyes, Respiratory Negative for: Constitutional, Neurological, Skin, Genitourinary, HENT, Psychiatric, Allergic/Imm, Heme/Lymph Last edited by Theodore Demark, COA on 10/06/2021  2:22 PM.     ALLERGIES No Known Allergies  PAST MEDICAL HISTORY Past Medical History:  Diagnosis Date   Anemia    Anxiety    Arthritis    Gout   Cataracts, both eyes    Diabetic retinopathy ([place])    NPDR OU   Diverticulitis    GERD (gastroesophageal reflux disease)    Gout    Headache    h/o migraines   History of fracture of patella    right knee   History of positive PPD    Patient always shows positive   Hyperlipidemia    Hypertension    Hypertensive retinopathy    OU   Hypothyroidism    Lichen sclerosus [id number]   of vulva   Metatarsal fracture    Neuropathy    Osteopenia    Peripheral vascular disease (HCC)    Polyneuropathy    numbness and tingling in feet and toes   Renal insufficiency    Stage 3   Sleep apnea    does not use cpap-lost weight    Type 2 diabetes mellitus, uncontrolled    Past Surgical History:  Procedure Laterality Date   ABDOMINAL HYSTERECTOMY     AMPUTATION TOE Right 05/08/2020   Procedure: AMPUTATION TOE-Right 4th Toe;  Surgeon: Caroline More, DPM;  Location: [facility];  Service: Podiatry;  Laterality: Right;   AMPUTATION TOE Left 04/22/2021   Procedure: AMPUTATION TOE - 4TH METARSOPHANGEAL JOINT;  Surgeon: Caroline More, DPM;  Location: [facility];  Service: Podiatry;  Laterality: Left;   APPENDECTOMY     BREAST REDUCTION SURGERY  2001   CATARACT EXTRACTION     CESAREAN SECTION  1976   COLONOSCOPY  03/05/2013   Nml - due for repeat 03/06/2018   COLONOSCOPY WITH PROPOFOL N/A 03/18/2019   Procedure: COLONOSCOPY WITH PROPOFOL;  Surgeon: Toledo, Benay Pike, MD;  Location: [facility];  Service: Gastroenterology;  Laterality:  N/A;   DIAGNOSTIC LAPAROSCOPY     DILATION AND CURETTAGE OF UTERUS  1989   ENDARTERECTOMY FEMORAL Bilateral 03/09/2018   Procedure: ENDARTERECTOMY FEMORAL;  Surgeon: Algernon Huxley, MD;  Location: [facility];  Service: Vascular;  Laterality: Bilateral;   ENDARTERECTOMY POPLITEAL Left 03/09/2018   Procedure: ENDARTERECTOMY POPLITEAL AND SFA;  Surgeon: Algernon Huxley, MD;  Location: [facility];  Service: Vascular;  Laterality: Left;   ESOPHAGOGASTRODUODENOSCOPY  03/05/2013   ESOPHAGOGASTRODUODENOSCOPY (EGD) WITH PROPOFOL N/A 03/18/2019   Procedure: ESOPHAGOGASTRODUODENOSCOPY (EGD) WITH PROPOFOL;  Surgeon: Toledo, Benay Pike, MD;  Location: [facility];  Service: Gastroenterology;  Laterality: N/A;  EYE SURGERY     Eyelid Surgery  2012   INTRAMEDULLARY (IM) NAIL INTERTROCHANTERIC Left 10/30/2015   Procedure: INTRAMEDULLARY (IM) NAIL INTERTROCHANTRIC ;  Surgeon: Hessie Knows, MD;  Location: [facility];  Service: Orthopedics;  Laterality: Left;   KYPHOPLASTY N/A 10/25/2018   Procedure: L4 KYPHOPLASTY;  Surgeon: Hessie Knows, MD;  Location: [facility];  Service: Orthopedics;  Laterality: N/A;   LAPAROSCOPIC HYSTERECTOMY  2000   total   LOWER EXTREMITY ANGIOGRAPHY Left 03/08/2017   Procedure: LOWER EXTREMITY ANGIOGRAPHY;  Surgeon: Algernon Huxley, MD;  Location: [facility];  Service: Cardiovascular;  Laterality: Left;   LOWER EXTREMITY ANGIOGRAPHY Left 10/30/2017   Procedure: LOWER EXTREMITY ANGIOGRAPHY;  Surgeon: Algernon Huxley, MD;  Location: [facility];  Service: Cardiovascular;  Laterality: Left;   LOWER EXTREMITY ANGIOGRAPHY Right 03/08/2018   Procedure: LOWER EXTREMITY ANGIOGRAPHY;  Surgeon: Algernon Huxley, MD;  Location: [facility];  Service: Cardiovascular;  Laterality: Right;   LOWER EXTREMITY ANGIOGRAPHY Left 10/01/2018   Procedure: LOWER EXTREMITY ANGIOGRAPHY;  Surgeon: Algernon Huxley, MD;  Location: [facility];  Service: Cardiovascular;  Laterality: Left;   LOWER EXTREMITY  ANGIOGRAPHY Right 10/08/2018   Procedure: LOWER EXTREMITY ANGIOGRAPHY;  Surgeon: Algernon Huxley, MD;  Location: [facility];  Service: Cardiovascular;  Laterality: Right;   LOWER EXTREMITY ANGIOGRAPHY Right 05/07/2020   Procedure: Lower Extremity Angiography;  Surgeon: Algernon Huxley, MD;  Location: [facility];  Service: Cardiovascular;  Laterality: Right;   LYSIS OF ADHESION  01/25/2021   Procedure: LYSIS OF ADHESION;  Surgeon: Ronny Bacon, MD;  Location: [facility];  Service: General;;   PERIPHERAL VASCULAR INTERVENTION  03/08/2018   Procedure: PERIPHERAL VASCULAR INTERVENTION;  Surgeon: Algernon Huxley, MD;  Location: [facility];  Service: Cardiovascular;;   PORTA CATH INSERTION N/A 02/17/2020   Procedure: PORTA CATH INSERTION;  Surgeon: Algernon Huxley, MD;  Location: [facility];  Service: Cardiovascular;  Laterality: N/A;   REDUCTION MAMMAPLASTY  1997   SACROPLASTY N/A 10/25/2018   Procedure: S1 SACROPLASTY;  Surgeon: Hessie Knows, MD;  Location: [facility];  Service: Orthopedics;  Laterality: N/A;   FAMILY HISTORY Family History  Problem Relation Age of Onset   Coronary artery disease Father    Heart attack Father    Coronary artery disease Mother    Heart attack Mother    Ovarian cancer Sister 18       sister had hormonal therapy for IVF txs-which increased risk factor for ovarian cancer   Breast cancer Neg Hx    SOCIAL HISTORY Social History   Tobacco Use   Smoking status: Former    Packs/day: 1.00    Years: 20.00    Total pack years: 20.00    Types: Cigarettes    Quit date: 03/07/1996    Years since quitting: 25.6    Passive exposure: Past   Smokeless tobacco: Never   Tobacco comments:    started smoking at age 75 but stopped smoking in 2000  Vaping Use   Vaping Use: Never used  Substance Use Topics   Alcohol use: No    Alcohol/week: 0.0 standard drinks of alcohol   Drug use: No       OPHTHALMIC EXAM: Base Eye Exam     Visual Acuity  (Snellen - Linear)       Right Left   Dist [place] 20/30 -1 20/20 -1   Dist ph [place] NI  Tonometry (Tonopen, 2:19 PM)       Right Left   Pressure 14 12         Pupils       Dark Light Shape React APD   Right 3 2 Round Brisk None   Left 3 2 Round Brisk None         Visual Fields (Counting fingers)       Left Right    Full Full         Extraocular Movement       Right Left    Full, Ortho Full, Ortho         Neuro/Psych     Oriented x3: Yes   Mood/Affect: Normal         Dilation     Both eyes: 1.0% Mydriacyl, 2.5% Phenylephrine @ 2:19 PM           Slit Lamp and Fundus Exam     External Exam       Right Left   External Normal Normal         Slit Lamp Exam       Right Left   Lids/Lashes dermatochalasis dermatochalasis   Conjunctiva/Sclera White and quiet White and quiet   Cornea arcus; well healed cataract wound; 2-3+ diffuse Punctate epithelial erosions, decreased TBUT, mild Anterior basement membrane dystrophy superiorly arcus; well healed cataract wound, 2-3+ diffuse Punctate epithelial erosions, irregualr epi surface, decreased TBUT   Anterior Chamber Deep and quiet Deep and quiet   Iris Round and dilated Round and dilated   Lens PCIOL; open PC PCIOL; open PC   Anterior Vitreous syneresis, Posterior vitreous detachment, vitreous condensations inferiorly syneresis, Posterior vitreous detachment         Fundus Exam       Right Left   Disc Superior hyperemia, mild Pallor Pink and Sharp   C/D Ratio 0.6 0.5   Macula Flat, Blunted foveal reflex, +cystic changes - increased, +Epiretinal membrane, minimal MA, PED flat; good foveal reflex, no heme or edema, small pigment clump IT to fovea   Vessels attenuated, Tortuous attenuated, Tortuous   Periphery Attached; scattered DBH--greastest temporal periphery Attached, no heme           IMAGING AND PROCEDURES  Imaging and Procedures for 04/25/17  OCT, Retina - OU - Both Eyes        Right Eye Quality was good. Central Foveal Thickness: 385. Progression has worsened. Findings include no SRF, abnormal foveal contour, epiretinal membrane, intraretinal fluid (Mild interval increase in IRF/central edema).   Left Eye Quality was good. Central Foveal Thickness: 282. Progression has been stable. Findings include normal foveal contour, no IRF, no SRF (Trace ERM).   Notes *Images captured and stored on drive  Diagnosis / Impression:  OD: BRVO w/ CME - mild interval increase in IRF and central edema OS: NFP; no IRF/SRF--stable, trace ERM  Clinical management:  See below  Abbreviations: NFP - Normal foveal profile. CME - cystoid macular edema. PED - pigment epithelial detachment. IRF - intraretinal fluid. SRF - subretinal fluid. EZ - ellipsoid zone. ERM - epiretinal membrane. ORA - outer retinal atrophy. ORT - outer retinal tubulation. SRHM - subretinal hyper-reflective material      Intravitreal Injection, Pharmacologic Agent - OD - Right Eye       Time Out 10/06/2021. 3:45 PM. Confirmed correct patient, procedure, site, and patient consented.   Anesthesia Topical anesthesia was used. Anesthetic medications included Lidocaine 2%, Proparacaine 0.5%.   Procedure Preparation  included 5% betadine to ocular surface, eyelid speculum. A (32 g) needle was used.   Injection: 2 mg aflibercept 2 MG/0.05ML   Route: Intravitreal, Site: Right Eye   NDC: [id number], Lot: [id number], Expiration date: 11/03/2022, Waste: 0 mL   Post-op Post injection exam found visual acuity of at least counting fingers. The patient tolerated the procedure well. There were no complications. The patient received written and verbal post procedure care education. Post injection medications were not given.            ASSESSMENT/PLAN:    ICD-10-CM   1. Branch retinal vein occlusion of right eye with macular edema  H34.8310 OCT, Retina - OU - Both Eyes    Intravitreal Injection, Pharmacologic  Agent - OD - Right Eye    aflibercept (EYLEA) SOLN 2 mg    2. Both eyes affected by mild nonproliferative diabetic retinopathy with macular edema, associated with type 2 diabetes mellitus ([place])  N39.7673     3. Essential hypertension  I10     4. Hypertensive retinopathy of both eyes  H35.033     5. Epiretinal membrane (ERM) of right eye  H35.371     6. Pseudophakia of both eyes  Z96.1       1. BRVO w/ CME OD  - by history, pt states symptoms first noticed 2 wks prior to presentation, but reports changes may have occurred prior  - initial exam with differential tortuosity of vessels (OD > OS)  - FA (02.10.20) shows mild late staining / leakage in macula, staining / leakage of disc -- improving CME  - differential includes DM2 (DME), hypertensive retinopathy, inflammatory etiology / uveitis  - S/P IVA OD #1 (02.08.19), #2 (03.11.19), #3 (04.09.19), #4 (05.20.19), #5 (02.10.20)  - gave IVA OD on 2.10.20 due to pending Eylea4U for 2020 -- resulted in increased IRF/CME  - review of OCTs show persistent IRF and cystic changes --  resistance to IVA   - June 2019 -- switched therapies: S/P IVE OD #1 (06.24.19), #2 (07.24.19), #3 (09.04.19), #4 (10.30.19),#5 (12.30.19), #6 (03.23.20), #7 (05.05.20), #8 (07.16.20), #9 (07.17.20), #10 (08.28.20), #11 (10.13.20), # 12 (11.17.20), #13 (2.8.21), #14 (03.09.21), #15 (04.13.21), #16 (05.11.21), #17 (06.17.21), #18 (07.23.21), #19 (08.30.21), #20 (10.04.21), #21 (11.08.21), #22 (12.08.21), #23 (01.31.22), #24 (02.28.22), #25 (04.01.22), #26 (06.15.22), #27 (07.13.22), #28 (08.17.22), #29 (09.21.22), #30 (10.19.22), #31 (11.16.22), #32 (12.16.22), #33 (01.13.23), #34 (02.10.23), #35 (03.15.23), #36 (04.14.23), #37 (05.15.23), #38 (06.12.23), #39 (07.10.23), #40 (08.07.23), #41 (09.06.23)  - OCT today shows mild interval increase in IRF and central edema at 4 weeks  - BCVA 20/30-1 -- slightly improved from 20/40-1  - Eylea4U benefits investigation completed  and pt approved for IVE for 2023  - recommend IVE OD #42 today, 10.04.23 w/ f/u in 4 weeks  - RBA of procedure discussed, questions answered  - informed consent obtained  - see procedure note   - Eylea informed consent form obtained, re-signed and scanned on 05.15.23  - f/u 4 weeks  -- DFE/OCT/possible injection  2. Mild nonproliferative diabetic retinopathy, both eyes  - A1c 6.6 on 07.01.23, 6.0 on 10.14.22  - could be contributing to CME OD  - OS with minimal diabetic retinopathy  - continue to monitor  3,4. Hypertensive retinopathy OU - stable  - as above, may have contributing to CME OD  - discussed importance of tight BP control  - monitor  5. Epiretinal membrane, right eye   - stable nasal ERM  - no indication  for surgery at this time  6. Pseudophakia OU  - s/p CE/IOL OU by cataract surgeon in [facility]  - doing well  - monitor  Ophthalmic Meds Ordered this visit:  Meds ordered this encounter  Medications   aflibercept (EYLEA) SOLN 2 mg     Return in about 4 weeks (around 11/03/2021) for DFE, OCT.  This document serves as a record of services personally performed by Gardiner Sleeper, MD, PhD. It was created on their behalf by Roselee Nova, COMT. The creation of this record is the provider's dictation and/or activities during the visit.  Electronically signed by: Roselee Nova, COMT 10/06/21 5:21 PM  Gardiner Sleeper, M.D., Ph.D. Diseases & Surgery of the Retina and Vitreous [facility]  I have reviewed the above documentation for accuracy and completeness, and I agree with the above. Gardiner Sleeper, M.D., Ph.D. 10/06/21 5:21 PM  Abbreviations: M myopia (nearsighted); A astigmatism; H hyperopia (farsighted); P presbyopia; Mrx spectacle prescription;  CTL contact lenses; OD right eye; OS left eye; OU both eyes  XT exotropia; ET esotropia; PEK punctate epithelial keratitis; PEE punctate epithelial erosions; DES dry eye syndrome; MGD meibomian gland  dysfunction; ATs artificial tears; PFAT's preservative free artificial tears; [place] nuclear sclerotic cataract; PSC posterior subcapsular cataract; ERM epi-retinal membrane; PVD posterior vitreous detachment; RD retinal detachment; DM diabetes mellitus; DR diabetic retinopathy; NPDR non-proliferative diabetic retinopathy; PDR proliferative diabetic retinopathy; CSME clinically significant macular edema; DME diabetic macular edema; dbh dot blot hemorrhages; CWS cotton wool spot; POAG primary open angle glaucoma; C/D cup-to-disc ratio; HVF humphrey visual field; GVF goldmann visual field; OCT optical coherence tomography; IOP intraocular pressure; BRVO Branch retinal vein occlusion; CRVO central retinal vein occlusion; CRAO central retinal artery occlusion; BRAO branch retinal artery occlusion; RT retinal tear; SB scleral buckle; PPV pars plana vitrectomy; VH Vitreous hemorrhage; PRP panretinal laser photocoagulation; IVK intravitreal kenalog; VMT vitreomacular traction; MH Macular hole;  NVD neovascularization of the disc; NVE neovascularization elsewhere; AREDS age related eye disease study; ARMD age related macular degeneration; POAG primary open angle glaucoma; EBMD epithelial/anterior basement membrane dystrophy; ACIOL anterior chamber intraocular lens; IOL intraocular lens; PCIOL posterior chamber intraocular lens; Phaco/IOL phacoemulsification with intraocular lens placement; [place] photorefractive keratectomy; LASIK laser assisted in situ keratomileusis; HTN hypertension; DM diabetes mellitus; COPD chronic obstructive pulmonary disease

## 2021-10-06 ENCOUNTER — Ambulatory Visit (INDEPENDENT_AMBULATORY_CARE_PROVIDER_SITE_OTHER): Payer: Medicare Other | Admitting: Ophthalmology

## 2021-10-06 ENCOUNTER — Encounter (INDEPENDENT_AMBULATORY_CARE_PROVIDER_SITE_OTHER): Payer: Self-pay | Admitting: Ophthalmology

## 2021-10-06 DIAGNOSIS — H35033 Hypertensive retinopathy, bilateral: Secondary | ICD-10-CM | POA: Diagnosis not present

## 2021-10-06 DIAGNOSIS — H35371 Puckering of macula, right eye: Secondary | ICD-10-CM

## 2021-10-06 DIAGNOSIS — I10 Essential (primary) hypertension: Secondary | ICD-10-CM

## 2021-10-06 DIAGNOSIS — E113213 Type 2 diabetes mellitus with mild nonproliferative diabetic retinopathy with macular edema, bilateral: Secondary | ICD-10-CM

## 2021-10-06 DIAGNOSIS — H34831 Tributary (branch) retinal vein occlusion, right eye, with macular edema: Secondary | ICD-10-CM

## 2021-10-06 DIAGNOSIS — Z961 Presence of intraocular lens: Secondary | ICD-10-CM

## 2021-10-06 MED ORDER — AFLIBERCEPT 2MG/0.05ML IZ SOLN FOR KALEIDOSCOPE
2.0000 mg | INTRAVITREAL | Status: AC | PRN
  Administered 2021-10-06: 2 mg via INTRAVITREAL

## 2021-10-07 ENCOUNTER — Inpatient Hospital Stay: Payer: Medicare Other

## 2021-10-07 ENCOUNTER — Other Ambulatory Visit: Payer: PRIVATE HEALTH INSURANCE

## 2021-10-07 VITALS — BP 135/69 | HR 68

## 2021-10-07 DIAGNOSIS — N183 Chronic kidney disease, stage 3 unspecified: Secondary | ICD-10-CM

## 2021-10-07 MED ORDER — EPOETIN ALFA-EPBX 20000 UNIT/ML IJ SOLN
20000.0000 [IU] | Freq: Once | INTRAMUSCULAR | Status: AC
  Administered 2021-10-07: 20000 [IU] via SUBCUTANEOUS
  Filled 2021-10-07: qty 1

## 2021-10-11 MED FILL — Iron Sucrose Inj 20 MG/ML (Fe Equiv): INTRAVENOUS | Qty: 10 | Status: AC

## 2021-10-12 ENCOUNTER — Inpatient Hospital Stay: Payer: Medicare Other

## 2021-10-12 VITALS — BP 131/56 | HR 67 | Temp 98.2°F | Resp 18

## 2021-10-12 DIAGNOSIS — D631 Anemia in chronic kidney disease: Secondary | ICD-10-CM

## 2021-10-12 DIAGNOSIS — N183 Chronic kidney disease, stage 3 unspecified: Secondary | ICD-10-CM | POA: Diagnosis not present

## 2021-10-12 MED ORDER — SODIUM CHLORIDE 0.9 % IV SOLN
200.0000 mg | Freq: Once | INTRAVENOUS | Status: AC
  Administered 2021-10-12: 200 mg via INTRAVENOUS
  Filled 2021-10-12: qty 200

## 2021-10-12 MED ORDER — SODIUM CHLORIDE 0.9 % IV SOLN
Freq: Once | INTRAVENOUS | Status: AC
  Filled 2021-10-12: qty 250

## 2021-10-12 MED ORDER — HEPARIN SOD (PORK) LOCK FLUSH 100 UNIT/ML IV SOLN
INTRAVENOUS | Status: AC
  Filled 2021-10-12: qty 5

## 2021-10-19 ENCOUNTER — Inpatient Hospital Stay: Payer: Medicare Other

## 2021-10-19 VITALS — BP 125/46 | HR 61 | Temp 97.9°F | Resp 16

## 2021-10-19 DIAGNOSIS — D631 Anemia in chronic kidney disease: Secondary | ICD-10-CM

## 2021-10-19 DIAGNOSIS — N183 Chronic kidney disease, stage 3 unspecified: Secondary | ICD-10-CM | POA: Diagnosis not present

## 2021-10-19 MED ORDER — SODIUM CHLORIDE 0.9 % IV SOLN
200.0000 mg | Freq: Once | INTRAVENOUS | Status: AC
  Administered 2021-10-19: 200 mg via INTRAVENOUS
  Filled 2021-10-19: qty 200

## 2021-10-19 MED ORDER — SODIUM CHLORIDE 0.9 % IV SOLN
Freq: Once | INTRAVENOUS | Status: AC
  Filled 2021-10-19: qty 250

## 2021-10-19 MED ORDER — SODIUM CHLORIDE 0.9% FLUSH
10.0000 mL | Freq: Once | INTRAVENOUS | Status: AC | PRN
  Administered 2021-10-19: 10 mL
  Filled 2021-10-19: qty 10

## 2021-10-19 MED ORDER — HEPARIN SOD (PORK) LOCK FLUSH 100 UNIT/ML IV SOLN
500.0000 [IU] | Freq: Once | INTRAVENOUS | Status: DC | PRN
  Filled 2021-10-19: qty 5

## 2021-10-19 MED ORDER — HEPARIN SOD (PORK) LOCK FLUSH 100 UNIT/ML IV SOLN
INTRAVENOUS | Status: AC
  Administered 2021-10-19: 500 [IU]
  Filled 2021-10-19: qty 5

## 2021-10-19 NOTE — Progress Notes (Signed)
Pt tolerated iron infusion without complaints.  VSS.  Pt denied 30 minute post observation  

## 2021-10-20 ENCOUNTER — Other Ambulatory Visit (INDEPENDENT_AMBULATORY_CARE_PROVIDER_SITE_OTHER): Payer: Self-pay | Admitting: Nurse Practitioner

## 2021-10-20 ENCOUNTER — Inpatient Hospital Stay: Payer: Medicare Other

## 2021-10-20 DIAGNOSIS — M79606 Pain in leg, unspecified: Secondary | ICD-10-CM

## 2021-10-21 ENCOUNTER — Inpatient Hospital Stay: Payer: Medicare Other

## 2021-10-21 ENCOUNTER — Ambulatory Visit: Payer: PRIVATE HEALTH INSURANCE

## 2021-10-21 ENCOUNTER — Ambulatory Visit (INDEPENDENT_AMBULATORY_CARE_PROVIDER_SITE_OTHER): Payer: Medicare Other

## 2021-10-21 ENCOUNTER — Other Ambulatory Visit: Payer: PRIVATE HEALTH INSURANCE

## 2021-10-21 VITALS — BP 119/60 | HR 77

## 2021-10-21 DIAGNOSIS — M79606 Pain in leg, unspecified: Secondary | ICD-10-CM | POA: Diagnosis not present

## 2021-10-21 DIAGNOSIS — N183 Chronic kidney disease, stage 3 unspecified: Secondary | ICD-10-CM | POA: Diagnosis not present

## 2021-10-21 DIAGNOSIS — D649 Anemia, unspecified: Secondary | ICD-10-CM

## 2021-10-21 LAB — HEMOGLOBIN AND HEMATOCRIT, BLOOD
HCT: 31 % — ABNORMAL LOW (ref 36.0–46.0)
Hemoglobin: 9.2 g/dL — ABNORMAL LOW (ref 12.0–15.0)

## 2021-10-21 MED ORDER — EPOETIN ALFA-EPBX 20000 UNIT/ML IJ SOLN
20000.0000 [IU] | Freq: Once | INTRAMUSCULAR | Status: AC
  Administered 2021-10-21: 20000 [IU] via SUBCUTANEOUS
  Filled 2021-10-21: qty 1

## 2021-10-25 MED FILL — Iron Sucrose Inj 20 MG/ML (Fe Equiv): INTRAVENOUS | Qty: 10 | Status: AC

## 2021-10-26 ENCOUNTER — Inpatient Hospital Stay: Payer: Medicare Other

## 2021-10-26 VITALS — BP 128/38 | HR 82 | Temp 98.2°F | Resp 17

## 2021-10-26 DIAGNOSIS — N183 Chronic kidney disease, stage 3 unspecified: Secondary | ICD-10-CM | POA: Diagnosis not present

## 2021-10-26 MED ORDER — SODIUM CHLORIDE 0.9 % IV SOLN
Freq: Once | INTRAVENOUS | Status: AC
  Filled 2021-10-26: qty 250

## 2021-10-26 MED ORDER — HEPARIN SOD (PORK) LOCK FLUSH 100 UNIT/ML IV SOLN
500.0000 [IU] | Freq: Once | INTRAVENOUS | Status: AC | PRN
  Administered 2021-10-26: 500 [IU]
  Filled 2021-10-26: qty 5

## 2021-10-26 MED ORDER — SODIUM CHLORIDE 0.9 % IV SOLN
200.0000 mg | Freq: Once | INTRAVENOUS | Status: AC
  Administered 2021-10-26: 200 mg via INTRAVENOUS
  Filled 2021-10-26: qty 200

## 2021-10-26 NOTE — Patient Instructions (Signed)
[facility]  Discharge Instructions: Thank you for choosing [facility] to provide your oncology and hematology care.  If you have a lab appointment with the Cancer Center, please go directly to the Cancer Center and check in at the registration area.  Wear comfortable clothing and clothing appropriate for easy access to any Portacath or PICC line.   We strive to give you quality time with your provider. You may need to reschedule your appointment if you arrive late (15 or more minutes).  Arriving late affects you and other patients whose appointments are after yours.  Also, if you miss three or more appointments without notifying the office, you may be dismissed from the clinic at the provider's discretion.      For prescription refill requests, have your pharmacy contact our office and allow 72 hours for refills to be completed.    Today you received the following chemotherapy and/or immunotherapy agents Venofer.      To help prevent nausea and vomiting after your treatment, we encourage you to take your nausea medication as directed.  BELOW ARE SYMPTOMS THAT SHOULD BE REPORTED IMMEDIATELY: *FEVER GREATER THAN 100.4 F (38 C) OR HIGHER *CHILLS OR SWEATING *NAUSEA AND VOMITING THAT IS NOT CONTROLLED WITH YOUR NAUSEA MEDICATION *UNUSUAL SHORTNESS OF BREATH *UNUSUAL BRUISING OR BLEEDING *URINARY PROBLEMS (pain or burning when urinating, or frequent urination) *BOWEL PROBLEMS (unusual diarrhea, constipation, pain near the anus) TENDERNESS IN MOUTH AND THROAT WITH OR WITHOUT PRESENCE OF ULCERS (sore throat, sores in mouth, or a toothache) UNUSUAL RASH, SWELLING OR PAIN  UNUSUAL VAGINAL DISCHARGE OR ITCHING   Items with * indicate a potential emergency and should be followed up as soon as possible or go to the Emergency Department if any problems should occur.  Please show the CHEMOTHERAPY ALERT CARD or IMMUNOTHERAPY ALERT CARD at check-in to  the Emergency Department and triage nurse.  Should you have questions after your visit or need to cancel or reschedule your appointment, please contact [facility]  [phone] and follow the prompts.  Office hours are 8:00 a.m. to 4:30 p.m. Monday - Friday. Please note that voicemails left after 4:00 p.m. may not be returned until the following business day.  We are closed weekends and major holidays. You have access to a nurse at all times for urgent questions. Please call the main number to the clinic [phone] and follow the prompts.  For any non-urgent questions, you may also contact your provider using MyChart. We now offer e-Visits for anyone 18 and older to request care online for non-urgent symptoms. For details visit [email or web address].   Also download the MyChart app! Go to the app store, search "MyChart", open the app, select [facility], and log in with your MyChart username and password.  Masks are optional in the cancer centers. If you would like for your care team to wear a mask while they are taking care of you, please let them know. For doctor visits, patients may have with them one support person who is at least 76 years old. At this time, visitors are not allowed in the infusion area.   

## 2021-10-28 NOTE — Progress Notes (Signed)
[facility] Note  11/03/2021     CHIEF COMPLAINT Patient presents for Retina Follow Up   HISTORY OF PRESENT ILLNESS: Cheryl Leonard is a 76 y.o. female who presents to the clinic today for:    HPI     Retina Follow Up   Patient presents with  CRVO/BRVO.  In right eye.  This started 4 weeks ago.  I, the attending physician,  performed the HPI with the patient and updated documentation appropriately.        Comments   Patient here for 4 weeks retina follow up for BRVO OD. Patient states vision doing the same. No eye pain.       Last edited by Bernarda Caffey, MD on 11/03/2021  5:53 PM.    Pt feels like vision is "fine"   Referring physician: Rusty Aus, MD [place],  [place] [id number]   HISTORICAL INFORMATION:   Selected notes from the MEDICAL RECORD NUMBER Referred by Dr. Marvel Plan for concern of DME OD Lab Results  Component Value Date   HGBA1C 7.3 (H) 01/11/2021     CURRENT MEDICATIONS: No current outpatient medications on file. (Ophthalmic Drugs)   No current facility-administered medications for this visit. (Ophthalmic Drugs)   Current Outpatient Medications (Other)  Medication Sig   ALPRAZolam (XANAX) 0.25 MG tablet Take 0.25 mg by mouth daily as needed for anxiety.   amLODipine (NORVASC) 5 MG tablet Take 5 mg by mouth 2 (two) times daily.   aspirin EC 81 MG tablet Take 1 tablet (81 mg total) by mouth daily.   cholecalciferol (VITAMIN D) 1000 units tablet Take 1,000 Units by mouth 2 (two) times daily.   CORAL CALCIUM PO Take 1 tablet by mouth daily.   denosumab (PROLIA) 60 MG/ML SOLN injection Inject 60 mg into the skin every 6 (six) months.    escitalopram (LEXAPRO) 10 MG tablet Take 10 mg by mouth daily.   estradiol (ESTRACE) 0.1 MG/GM vaginal cream Place 1 Applicatorful vaginally daily as needed (vaginal irritation).   famotidine (PEPCID) 40 MG tablet Take 40 mg  by mouth daily.   furosemide (LASIX) 20 MG tablet Take 20 mg by mouth every Wednesday.   gabapentin (NEURONTIN) 300 MG capsule Take 300 mg by mouth at bedtime as needed (pain).   HYDROcodone-acetaminophen (NORCO/VICODIN) 5-325 MG tablet Take 1 tablet by mouth daily.   levothyroxine (SYNTHROID, LEVOTHROID) 100 MCG tablet Take 100 mcg by mouth daily before breakfast.    lidocaine-prilocaine (EMLA) cream Apply 1 application topically as needed (apply prior to port a cath access).   Magnesium 500 MG TABS Take 500 mg by mouth every morning.    metFORMIN (GLUCOPHAGE) 1000 MG tablet Take 1,000 mg by mouth 2 (two) times daily with a meal.   metoprolol succinate (TOPROL-XL) 50 MG 24 hr tablet Take 50 mg by mouth daily. Take with or immediately following a meal.   mirtazapine (REMERON) 15 MG tablet Take 15 mg by mouth as needed.   Multiple Vitamin (MULTIVITAMIN WITH MINERALS) TABS tablet Take 1 tablet by mouth daily. Centrum Silver   nystatin cream (MYCOSTATIN) Apply 1 application topically daily as needed (Yeast infection).   olmesartan (BENICAR) 20 MG tablet Take 20 mg by mouth daily.   pantoprazole (PROTONIX) 40 MG tablet Take 40 mg by mouth daily.   pantoprazole sodium (PROTONIX) 40 mg Take 40 mg by mouth every morning.    rivaroxaban (XARELTO) 20 MG TABS  tablet Take 1 tablet (20 mg total) by mouth daily with supper.   rosuvastatin (CRESTOR) 20 MG tablet Take 20 mg by mouth every morning.   TRESIBA FLEXTOUCH 200 UNIT/ML SOPN Inject 25-30 Units as directed at bedtime. Titrate according to fasting blood glucose not to exceed 50 units a day   vitamin B-12 (CYANOCOBALAMIN) 1000 MCG tablet Take 1,000 mcg by mouth daily.   vitamin C (ASCORBIC ACID) 250 MG tablet Take 250 mg by mouth daily.   vitamin E 400 UNIT capsule Take 400 Units by mouth daily.   zolpidem (AMBIEN) 10 MG tablet Take 10 mg by mouth at bedtime.   No current facility-administered medications for this visit. (Other)   REVIEW OF  SYSTEMS: ROS   Positive for: Gastrointestinal, Musculoskeletal, Endocrine, Cardiovascular, Eyes, Respiratory Negative for: Constitutional, Neurological, Skin, Genitourinary, HENT, Psychiatric, Allergic/Imm, Heme/Lymph Last edited by Laddie Aquas, COA on 11/03/2021  2:47 PM.     ALLERGIES No Known Allergies  PAST MEDICAL HISTORY Past Medical History:  Diagnosis Date   Anemia    Anxiety    Arthritis    Gout   Cataracts, both eyes    Diabetic retinopathy (HCC)    NPDR OU   Diverticulitis    GERD (gastroesophageal reflux disease)    Gout    Headache    h/o migraines   History of fracture of patella    right knee   History of positive PPD    Patient always shows positive   Hyperlipidemia    Hypertension    Hypertensive retinopathy    OU   Hypothyroidism    Lichen sclerosus 12/30/2013   of vulva   Metatarsal fracture    Neuropathy    Osteopenia    Peripheral vascular disease (HCC)    Polyneuropathy    numbness and tingling in feet and toes   Renal insufficiency    Stage 3   Sleep apnea    does not use cpap-lost weight    Type 2 diabetes mellitus, uncontrolled    Past Surgical History:  Procedure Laterality Date   ABDOMINAL HYSTERECTOMY     AMPUTATION TOE Right 05/08/2020   Procedure: AMPUTATION TOE-Right 4th Toe;  Surgeon: Rosetta Posner, DPM;  Location: [facility];  Service: Podiatry;  Laterality: Right;   AMPUTATION TOE Left 04/22/2021   Procedure: AMPUTATION TOE - 4TH METARSOPHANGEAL JOINT;  Surgeon: Rosetta Posner, DPM;  Location: [facility];  Service: Podiatry;  Laterality: Left;   APPENDECTOMY     BREAST REDUCTION SURGERY  2001   CATARACT EXTRACTION     CESAREAN SECTION  1976   COLONOSCOPY  03/05/2013   Nml - due for repeat 03/06/2018   COLONOSCOPY WITH PROPOFOL N/A 03/18/2019   Procedure: COLONOSCOPY WITH PROPOFOL;  Surgeon: Toledo, [name], MD;  Location: [facility];  Service: Gastroenterology;  Laterality: N/A;   DIAGNOSTIC LAPAROSCOPY     DILATION AND  CURETTAGE OF UTERUS  1989   ENDARTERECTOMY FEMORAL Bilateral 03/09/2018   Procedure: ENDARTERECTOMY FEMORAL;  Surgeon: Annice Needy, MD;  Location: [facility];  Service: Vascular;  Laterality: Bilateral;   ENDARTERECTOMY POPLITEAL Left 03/09/2018   Procedure: ENDARTERECTOMY POPLITEAL AND SFA;  Surgeon: Annice Needy, MD;  Location: [facility];  Service: Vascular;  Laterality: Left;   ESOPHAGOGASTRODUODENOSCOPY  03/05/2013   ESOPHAGOGASTRODUODENOSCOPY (EGD) WITH PROPOFOL N/A 03/18/2019   Procedure: ESOPHAGOGASTRODUODENOSCOPY (EGD) WITH PROPOFOL;  Surgeon: Toledo, [name], MD;  Location: [facility];  Service: Gastroenterology;  Laterality: N/A;   EYE SURGERY  Eyelid Surgery  2012   INTRAMEDULLARY (IM) NAIL INTERTROCHANTERIC Left 10/30/2015   Procedure: INTRAMEDULLARY (IM) NAIL INTERTROCHANTRIC ;  Surgeon: Kennedy BuckerMichael Menz, MD;  Location: [facility];  Service: Orthopedics;  Laterality: Left;   KYPHOPLASTY N/A 10/25/2018   Procedure: L4 KYPHOPLASTY;  Surgeon: Kennedy BuckerMenz, Michael, MD;  Location: [facility];  Service: Orthopedics;  Laterality: N/A;   LAPAROSCOPIC HYSTERECTOMY  2000   total   LOWER EXTREMITY ANGIOGRAPHY Left 03/08/2017   Procedure: LOWER EXTREMITY ANGIOGRAPHY;  Surgeon: Annice Needyew, Jason S, MD;  Location: [facility];  Service: Cardiovascular;  Laterality: Left;   LOWER EXTREMITY ANGIOGRAPHY Left 10/30/2017   Procedure: LOWER EXTREMITY ANGIOGRAPHY;  Surgeon: Annice Needyew, Jason S, MD;  Location: [facility];  Service: Cardiovascular;  Laterality: Left;   LOWER EXTREMITY ANGIOGRAPHY Right 03/08/2018   Procedure: LOWER EXTREMITY ANGIOGRAPHY;  Surgeon: Annice Needyew, Jason S, MD;  Location: [facility];  Service: Cardiovascular;  Laterality: Right;   LOWER EXTREMITY ANGIOGRAPHY Left 10/01/2018   Procedure: LOWER EXTREMITY ANGIOGRAPHY;  Surgeon: Annice Needyew, Jason S, MD;  Location: [facility];  Service: Cardiovascular;  Laterality: Left;   LOWER EXTREMITY ANGIOGRAPHY Right 10/08/2018   Procedure: LOWER  EXTREMITY ANGIOGRAPHY;  Surgeon: Annice Needyew, Jason S, MD;  Location: [facility];  Service: Cardiovascular;  Laterality: Right;   LOWER EXTREMITY ANGIOGRAPHY Right 05/07/2020   Procedure: Lower Extremity Angiography;  Surgeon: Annice Needyew, Jason S, MD;  Location: [facility];  Service: Cardiovascular;  Laterality: Right;   LYSIS OF ADHESION  01/25/2021   Procedure: LYSIS OF ADHESION;  Surgeon: Campbell Lernerodenberg, Denny, MD;  Location: [facility];  Service: General;;   PERIPHERAL VASCULAR INTERVENTION  03/08/2018   Procedure: PERIPHERAL VASCULAR INTERVENTION;  Surgeon: Annice Needyew, Jason S, MD;  Location: [facility];  Service: Cardiovascular;;   PORTA CATH INSERTION N/A 02/17/2020   Procedure: PORTA CATH INSERTION;  Surgeon: Annice Needyew, Jason S, MD;  Location: [facility];  Service: Cardiovascular;  Laterality: N/A;   REDUCTION MAMMAPLASTY  1997   SACROPLASTY N/A 10/25/2018   Procedure: S1 SACROPLASTY;  Surgeon: Kennedy BuckerMenz, Michael, MD;  Location: [facility];  Service: Orthopedics;  Laterality: N/A;   FAMILY HISTORY Family History  Problem Relation Age of Onset   Coronary artery disease Father    Heart attack Father    Coronary artery disease Mother    Heart attack Mother    Ovarian cancer Sister [id number]       sister had hormonal therapy for IVF txs-which increased risk factor for ovarian cancer   Breast cancer Neg Hx    SOCIAL HISTORY Social History   Tobacco Use   Smoking status: Former    Packs/day: 1.00    Years: 20.00    Total pack years: 20.00    Types: Cigarettes    Quit date: 03/07/1996    Years since quitting: 25.6    Passive exposure: Past   Smokeless tobacco: Never   Tobacco comments:    started smoking at age [age] but stopped smoking in 2000  Vaping Use   Vaping Use: Never used  Substance Use Topics   Alcohol use: No    Alcohol/week: 0.0 standard drinks of alcohol   Drug use: No       OPHTHALMIC EXAM: Base Eye Exam     Visual Acuity (Snellen - Linear)       Right Left   Dist [place]  20/30 20/20         Tonometry (Tonopen, 2:45 PM)  Right Left   Pressure 16 16         Pupils       Dark Light Shape React APD   Right 3 2 Round Brisk None   Left 3 2 Round Brisk None         Visual Fields (Counting fingers)       Left Right    Full Full         Extraocular Movement       Right Left    Full, Ortho Full, Ortho         Neuro/Psych     Oriented x3: Yes   Mood/Affect: Normal         Dilation     Both eyes: 1.0% Mydriacyl, 2.5% Phenylephrine @ 2:45 PM           Slit Lamp and Fundus Exam     External Exam       Right Left   External Normal Normal         Slit Lamp Exam       Right Left   Lids/Lashes dermatochalasis dermatochalasis   Conjunctiva/Sclera White and quiet White and quiet   Cornea arcus; well healed cataract wound; 2-3+ diffuse Punctate epithelial erosions, decreased TBUT, mild Anterior basement membrane dystrophy superiorly arcus; well healed cataract wound, 2-3+ diffuse Punctate epithelial erosions, irregualr epi surface, decreased TBUT   Anterior Chamber Deep and quiet Deep and quiet   Iris Round and dilated Round and dilated   Lens PCIOL; open PC PCIOL; open PC   Anterior Vitreous syneresis, Posterior vitreous detachment, vitreous condensations inferiorly syneresis, Posterior vitreous detachment         Fundus Exam       Right Left   Disc Superior hyperemia, mild Pallor Pink and Sharp   C/D Ratio 0.6 0.5   Macula Flat, Blunted foveal reflex, +cystic changes - slightly improved, +Epiretinal membrane, minimal MA, PED flat; good foveal reflex, no heme or edema, small pigment clump IT to fovea   Vessels attenuated, Tortuous attenuated, Tortuous   Periphery Attached; scattered DBH -- greastest temporal periphery Attached, no heme           IMAGING AND PROCEDURES  Imaging and Procedures for 04/25/17  OCT, Retina - OU - Both Eyes       Right Eye Quality was good. Central Foveal Thickness: 390.  Progression has improved. Findings include no SRF, abnormal foveal contour, epiretinal membrane, intraretinal fluid (Persistent RF/central edema -- slightly improved).   Left Eye Quality was good. Central Foveal Thickness: 288. Progression has been stable. Findings include normal foveal contour, no IRF, no SRF (Trace ERM).   Notes *Images captured and stored on drive  Diagnosis / Impression:  OD: BRVO w/ CME - Persistent RF/central edema -- slightly improved OS: NFP; no IRF/SRF--stable, trace ERM  Clinical management:  See below  Abbreviations: NFP - Normal foveal profile. CME - cystoid macular edema. PED - pigment epithelial detachment. IRF - intraretinal fluid. SRF - subretinal fluid. EZ - ellipsoid zone. ERM - epiretinal membrane. ORA - outer retinal atrophy. ORT - outer retinal tubulation. SRHM - subretinal hyper-reflective material      Intravitreal Injection, Pharmacologic Agent - OD - Right Eye       Time Out 11/03/2021. 4:10 PM. Confirmed correct patient, procedure, site, and patient consented.   Anesthesia Topical anesthesia was used. Anesthetic medications included Lidocaine 2%, Proparacaine 0.5%.   Procedure Preparation included 5% betadine to ocular surface, eyelid speculum. A (  32 g) needle was used.   Injection: 2 mg aflibercept 2 MG/0.05ML   Route: Intravitreal, Site: Right Eye   NDC: [id number], Lot: [id number], Expiration date: 02/03/2023, Waste: 0 mL   Post-op Post injection exam found visual acuity of at least counting fingers. The patient tolerated the procedure well. There were no complications. The patient received written and verbal post procedure care education. Post injection medications were not given.             ASSESSMENT/PLAN:    ICD-10-CM   1. Branch retinal vein occlusion of right eye with macular edema  H34.8310 OCT, Retina - OU - Both Eyes    Intravitreal Injection, Pharmacologic Agent - OD - Right Eye    aflibercept (EYLEA) SOLN 2  mg    2. Both eyes affected by mild nonproliferative diabetic retinopathy with macular edema, associated with type 2 diabetes mellitus ([place])  [id number]     3. Essential hypertension  I10     4. Hypertensive retinopathy of both eyes  H35.033     5. Epiretinal membrane (ERM) of right eye  H35.371     6. Pseudophakia of both eyes  Z96.1        1. BRVO w/ CME OD  - by history, pt states symptoms first noticed 2 wks prior to presentation, but reports changes may have occurred prior  - initial exam with differential tortuosity of vessels (OD > OS)  - FA (02.10.20) shows mild late staining / leakage in macula, staining / leakage of disc -- improving CME  - differential includes DM2 (DME), hypertensive retinopathy, inflammatory etiology / uveitis  - S/P IVA OD #1 (02.08.19), #2 (03.11.19), #3 (04.09.19), #4 (05.20.19), #5 (02.10.20)  - gave IVA OD on 2.10.20 due to pending Eylea4U for 2020 -- resulted in increased IRF/CME  - review of OCTs show persistent IRF and cystic changes --  resistance to IVA   - June 2019 -- switched therapies: S/P IVE OD #1 (06.24.19), #2 (07.24.19), #3 (09.04.19), #4 (10.30.19),#5 (12.30.19), #6 (03.23.20), #7 (05.05.20), #8 (07.16.20), #9 (07.17.20), #10 (08.28.20), #11 (10.13.20), # 12 (11.17.20), #13 (2.8.21), #14 (03.09.21), #15 (04.13.21), #16 (05.11.21), #17 (06.17.21), #18 (07.23.21), #19 (08.30.21), #20 (10.04.21), #21 (11.08.21), #22 (12.08.21), #23 (01.31.22), #24 (02.28.22), #25 (04.01.22), #26 (06.15.22), #27 (07.13.22), #28 (08.17.22), #29 (09.21.22), #30 (10.19.22), #31 (11.16.22), #32 (12.16.22), #33 (01.13.23), #34 (02.10.23), #35 (03.15.23), #36 (04.14.23), #37 (05.15.23), #38 (06.12.23), #39 (07.10.23), #40 (08.07.23), #41 (09.06.23), #42 (10.04.23)  - OCT today shows Persistent RF/central edema -- slightly improved at 4 weeks  - BCVA 20/30 -- stable  - Eylea4U benefits investigation completed and pt approved for IVE for 2023  - recommend IVE OD #43  today, 11.01.23 w/ f/u in 4 weeks  - RBA of procedure discussed, questions answered  - informed consent obtained  - see procedure note   - Eylea informed consent form obtained, re-signed and scanned on 05.15.23  - f/u 4 weeks  -- DFE/OCT/possible injection  2. Mild nonproliferative diabetic retinopathy, both eyes  - A1c 6.6 on 07.01.23, 6.0 on 10.14.22  - could be contributing to CME OD  - OS with minimal diabetic retinopathy  - continue to monitor  3,4. Hypertensive retinopathy OU - stable  - as above, may have contributing to CME OD  - discussed importance of tight BP control  - monitor  5. Epiretinal membrane, right eye   - stable nasal ERM  - no indication for surgery at this time  6. Pseudophakia OU  -  s/p CE/IOL OU by cataract surgeon [facility]  - doing well  - monitor  Ophthalmic Meds Ordered this visit:  Meds ordered this encounter  Medications   aflibercept (EYLEA) SOLN 2 mg     Return in about 4 weeks (around 12/01/2021) for BRVO OD, DFE, OCT, Possible Injxn.  This document serves as a record of services personally performed by Gardiner Sleeper, MD, PhD. It was created on their behalf by Orvan Falconer, an ophthalmic technician. The creation of this record is the provider's dictation and/or activities during the visit.    Electronically signed by: Orvan Falconer, OA, 11/08/21  1:14 AM  This document serves as a record of services personally performed by Gardiner Sleeper, MD, PhD. It was created on their behalf by San Jetty. Owens Shark, OA an ophthalmic technician. The creation of this record is the provider's dictation and/or activities during the visit.    Electronically signed by: San Jetty. [place], [place] 11.01.2023 1:14 AM  Gardiner Sleeper, M.D., Ph.D. Diseases & Surgery of the Retina and Vitreous [facility]  I have reviewed the above documentation for accuracy and completeness, and I agree with the above. Gardiner Sleeper, M.D., Ph.D. 11/08/21  1:15 AM  Abbreviations: M myopia (nearsighted); A astigmatism; H hyperopia (farsighted); P presbyopia; Mrx spectacle prescription;  CTL contact lenses; OD right eye; OS left eye; OU both eyes  XT exotropia; ET esotropia; PEK punctate epithelial keratitis; PEE punctate epithelial erosions; DES dry eye syndrome; MGD meibomian gland dysfunction; ATs artificial tears; PFAT's preservative free artificial tears; [place] nuclear sclerotic cataract; PSC posterior subcapsular cataract; ERM epi-retinal membrane; PVD posterior vitreous detachment; RD retinal detachment; DM diabetes mellitus; DR diabetic retinopathy; NPDR non-proliferative diabetic retinopathy; PDR proliferative diabetic retinopathy; CSME clinically significant macular edema; DME diabetic macular edema; dbh dot blot hemorrhages; CWS cotton wool spot; POAG primary open angle glaucoma; C/D cup-to-disc ratio; HVF humphrey visual field; GVF goldmann visual field; OCT optical coherence tomography; IOP intraocular pressure; BRVO Branch retinal vein occlusion; CRVO central retinal vein occlusion; CRAO central retinal artery occlusion; BRAO branch retinal artery occlusion; RT retinal tear; SB scleral buckle; PPV pars plana vitrectomy; VH Vitreous hemorrhage; PRP panretinal laser photocoagulation; IVK intravitreal kenalog; VMT vitreomacular traction; MH Macular hole;  NVD neovascularization of the disc; NVE neovascularization elsewhere; AREDS age related eye disease study; ARMD age related macular degeneration; POAG primary open angle glaucoma; EBMD epithelial/anterior basement membrane dystrophy; ACIOL anterior chamber intraocular lens; IOL intraocular lens; PCIOL posterior chamber intraocular lens; Phaco/IOL phacoemulsification with intraocular lens placement; [place] photorefractive keratectomy; LASIK laser assisted in situ keratomileusis; HTN hypertension; DM diabetes mellitus; COPD chronic obstructive pulmonary disease

## 2021-11-01 ENCOUNTER — Encounter (INDEPENDENT_AMBULATORY_CARE_PROVIDER_SITE_OTHER): Payer: Self-pay

## 2021-11-03 ENCOUNTER — Ambulatory Visit (INDEPENDENT_AMBULATORY_CARE_PROVIDER_SITE_OTHER): Payer: Medicare Other | Admitting: Ophthalmology

## 2021-11-03 ENCOUNTER — Encounter (INDEPENDENT_AMBULATORY_CARE_PROVIDER_SITE_OTHER): Payer: Self-pay | Admitting: Ophthalmology

## 2021-11-03 DIAGNOSIS — H35371 Puckering of macula, right eye: Secondary | ICD-10-CM

## 2021-11-03 DIAGNOSIS — E113213 Type 2 diabetes mellitus with mild nonproliferative diabetic retinopathy with macular edema, bilateral: Secondary | ICD-10-CM

## 2021-11-03 DIAGNOSIS — I10 Essential (primary) hypertension: Secondary | ICD-10-CM | POA: Diagnosis not present

## 2021-11-03 DIAGNOSIS — H35033 Hypertensive retinopathy, bilateral: Secondary | ICD-10-CM | POA: Diagnosis not present

## 2021-11-03 DIAGNOSIS — H34831 Tributary (branch) retinal vein occlusion, right eye, with macular edema: Secondary | ICD-10-CM

## 2021-11-03 DIAGNOSIS — Z961 Presence of intraocular lens: Secondary | ICD-10-CM

## 2021-11-03 MED ORDER — AFLIBERCEPT 2MG/0.05ML IZ SOLN FOR KALEIDOSCOPE
2.0000 mg | INTRAVITREAL | Status: AC | PRN
  Administered 2021-11-03: 2 mg via INTRAVITREAL

## 2021-11-03 MED FILL — Iron Sucrose Inj 20 MG/ML (Fe Equiv): INTRAVENOUS | Qty: 10 | Status: AC

## 2021-11-04 ENCOUNTER — Inpatient Hospital Stay: Payer: Medicare Other | Attending: Internal Medicine

## 2021-11-04 ENCOUNTER — Inpatient Hospital Stay: Payer: Medicare Other

## 2021-11-04 VITALS — BP 113/78 | HR 78 | Temp 96.1°F

## 2021-11-04 DIAGNOSIS — E1122 Type 2 diabetes mellitus with diabetic chronic kidney disease: Secondary | ICD-10-CM | POA: Diagnosis not present

## 2021-11-04 DIAGNOSIS — N183 Chronic kidney disease, stage 3 unspecified: Secondary | ICD-10-CM

## 2021-11-04 DIAGNOSIS — D631 Anemia in chronic kidney disease: Secondary | ICD-10-CM | POA: Diagnosis present

## 2021-11-04 DIAGNOSIS — D649 Anemia, unspecified: Secondary | ICD-10-CM

## 2021-11-04 DIAGNOSIS — I129 Hypertensive chronic kidney disease with stage 1 through stage 4 chronic kidney disease, or unspecified chronic kidney disease: Secondary | ICD-10-CM | POA: Diagnosis not present

## 2021-11-04 DIAGNOSIS — Z79899 Other long term (current) drug therapy: Secondary | ICD-10-CM | POA: Insufficient documentation

## 2021-11-04 LAB — HEMOGLOBIN AND HEMATOCRIT, BLOOD
HCT: 29.4 % — ABNORMAL LOW (ref 36.0–46.0)
Hemoglobin: 9.1 g/dL — ABNORMAL LOW (ref 12.0–15.0)

## 2021-11-04 MED ORDER — EPOETIN ALFA-EPBX 10000 UNIT/ML IJ SOLN
20000.0000 [IU] | Freq: Once | INTRAMUSCULAR | Status: AC
  Administered 2021-11-04: 20000 [IU] via SUBCUTANEOUS
  Filled 2021-11-04: qty 2

## 2021-11-16 ENCOUNTER — Other Ambulatory Visit: Payer: PRIVATE HEALTH INSURANCE

## 2021-11-16 ENCOUNTER — Ambulatory Visit: Payer: PRIVATE HEALTH INSURANCE | Admitting: Internal Medicine

## 2021-11-16 ENCOUNTER — Inpatient Hospital Stay: Payer: Medicare Other

## 2021-11-16 ENCOUNTER — Inpatient Hospital Stay (HOSPITAL_BASED_OUTPATIENT_CLINIC_OR_DEPARTMENT_OTHER): Payer: Medicare Other | Admitting: Nurse Practitioner

## 2021-11-16 ENCOUNTER — Encounter: Payer: Self-pay | Admitting: Nurse Practitioner

## 2021-11-16 ENCOUNTER — Ambulatory Visit: Payer: PRIVATE HEALTH INSURANCE

## 2021-11-16 VITALS — BP 135/47 | HR 74 | Temp 96.8°F | Wt 162.0 lb

## 2021-11-16 VITALS — BP 140/45 | HR 75

## 2021-11-16 DIAGNOSIS — N183 Chronic kidney disease, stage 3 unspecified: Secondary | ICD-10-CM

## 2021-11-16 DIAGNOSIS — D649 Anemia, unspecified: Secondary | ICD-10-CM

## 2021-11-16 DIAGNOSIS — D631 Anemia in chronic kidney disease: Secondary | ICD-10-CM | POA: Diagnosis not present

## 2021-11-16 DIAGNOSIS — I129 Hypertensive chronic kidney disease with stage 1 through stage 4 chronic kidney disease, or unspecified chronic kidney disease: Secondary | ICD-10-CM | POA: Diagnosis not present

## 2021-11-16 DIAGNOSIS — D508 Other iron deficiency anemias: Secondary | ICD-10-CM

## 2021-11-16 LAB — BASIC METABOLIC PANEL
Anion gap: 8 (ref 5–15)
BUN: 40 mg/dL — ABNORMAL HIGH (ref 8–23)
CO2: 23 mmol/L (ref 22–32)
Calcium: 10 mg/dL (ref 8.9–10.3)
Chloride: 105 mmol/L (ref 98–111)
Creatinine, Ser: 1.64 mg/dL — ABNORMAL HIGH (ref 0.44–1.00)
GFR, Estimated: 32 mL/min — ABNORMAL LOW (ref >60)
Glucose, Bld: 122 mg/dL — ABNORMAL HIGH (ref 70–99)
Potassium: 4.8 mmol/L (ref 3.5–5.1)
Sodium: 136 mmol/L (ref 135–145)

## 2021-11-16 LAB — LACTATE DEHYDROGENASE: LDH: 119 U/L (ref 98–192)

## 2021-11-16 LAB — CBC WITH DIFFERENTIAL/PLATELET
Abs Immature Granulocytes: 0.02 10*3/uL (ref 0.00–0.07)
Basophils Absolute: 0.1 10*3/uL (ref 0.0–0.1)
Basophils Relative: 1 %
Eosinophils Absolute: 0.2 10*3/uL (ref 0.0–0.5)
Eosinophils Relative: 3 %
HCT: 27.2 % — ABNORMAL LOW (ref 36.0–46.0)
Hemoglobin: 8.5 g/dL — ABNORMAL LOW (ref 12.0–15.0)
Immature Granulocytes: 0 %
Lymphocytes Relative: 19 %
Lymphs Abs: 1.2 10*3/uL (ref 0.7–4.0)
MCH: 29 pg (ref 26.0–34.0)
MCHC: 31.3 g/dL (ref 30.0–36.0)
MCV: 92.8 fL (ref 80.0–100.0)
Monocytes Absolute: 0.7 10*3/uL (ref 0.1–1.0)
Monocytes Relative: 11 %
Neutro Abs: 4 10*3/uL (ref 1.7–7.7)
Neutrophils Relative %: 66 %
Platelets: 297 10*3/uL (ref 150–400)
RBC: 2.93 MIL/uL — ABNORMAL LOW (ref 3.87–5.11)
RDW: 18.6 % — ABNORMAL HIGH (ref 11.5–15.5)
WBC: 6.2 10*3/uL (ref 4.0–10.5)
nRBC: 0 % (ref 0.0–0.2)

## 2021-11-16 MED ORDER — SODIUM CHLORIDE 0.9 % IV SOLN
Freq: Once | INTRAVENOUS | Status: AC
  Filled 2021-11-16: qty 250

## 2021-11-16 MED ORDER — HEPARIN SOD (PORK) LOCK FLUSH 100 UNIT/ML IV SOLN
500.0000 [IU] | Freq: Once | INTRAVENOUS | Status: AC | PRN
  Administered 2021-11-16: 500 [IU]
  Filled 2021-11-16: qty 5

## 2021-11-16 MED ORDER — SODIUM CHLORIDE 0.9 % IV SOLN
200.0000 mg | Freq: Once | INTRAVENOUS | Status: AC
  Administered 2021-11-16: 200 mg via INTRAVENOUS
  Filled 2021-11-16: qty 200

## 2021-11-16 NOTE — Progress Notes (Signed)
[facility] NOTE  Patient Care Team: Rusty Aus, MD as PCP - General (Internal Medicine) Josefine Class, MD as Referring Physician (Gastroenterology) Cammie Sickle, MD as Consulting Physician (Hematology and Oncology)  CHIEF COMPLAINTS/PURPOSE OF CONSULTATION: Anemia  HEMATOLOGY HISTORY  # ANEMIA- Jan 2021- 8.8/ferritin 11 [PCP]; N-WBC/platelets? IDA vs other- EGD-2015/colonoscopy-? 2015; 2020- [Dr.Skulskie] ; capsule-2016- ? 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   HISTORY OF PRESENTING ILLNESS: Ambulating independently.  Alone.  Cheryl Leonard 76 y.o. female anemia iron deficiency, possible CKD, stage III, who returns for follow up. She feels well and denies complaints. Fatigue is stable, no better or worse. Denies black or bloody stools. Taking oral iron.   Review of Systems  Constitutional:  Positive for malaise/fatigue. Negative for chills, diaphoresis and fever.  HENT:  Negative for nosebleeds and sore throat.   Eyes:  Negative for double vision.  Respiratory:  Negative for cough, hemoptysis, sputum production, shortness of breath and wheezing.   Cardiovascular:  Negative for chest pain, palpitations, orthopnea and leg swelling.  Gastrointestinal:  Negative for abdominal pain, blood in stool, constipation, diarrhea, heartburn, melena, nausea and vomiting.  Genitourinary:  Negative for dysuria, frequency and urgency.  Musculoskeletal:  Negative for joint pain.  Skin: Negative.  Negative for itching and rash.  Neurological:  Negative for dizziness, tingling, focal weakness, weakness and headaches.  Endo/Heme/Allergies:  Does not bruise/bleed easily.  Psychiatric/Behavioral:  Negative for depression. The patient is not nervous/anxious and does not have insomnia.      MEDICAL HISTORY:  Past Medical History:  Diagnosis Date   Anemia    Anxiety    Arthritis    Gout   Cataracts, both eyes    Diabetic retinopathy ([place])    NPDR OU   Diverticulitis    GERD (gastroesophageal reflux disease)    Gout    Headache    h/o migraines   History of fracture of patella    right knee   History of positive PPD    Patient always shows positive   Hyperlipidemia    Hypertension    Hypertensive retinopathy    OU   Hypothyroidism    Lichen sclerosus 16/10/9602   of vulva   Metatarsal fracture    Neuropathy    Osteopenia    Peripheral vascular disease (HCC)    Polyneuropathy    numbness and tingling in feet and toes   Renal insufficiency    Stage 3   Sleep apnea    does not use cpap-lost weight    Type 2 diabetes mellitus, uncontrolled     SURGICAL HISTORY: Past Surgical History:  Procedure Laterality Date   ABDOMINAL HYSTERECTOMY     AMPUTATION TOE Right 05/08/2020   Procedure: AMPUTATION TOE-Right 4th Toe;  Surgeon: Caroline More, DPM;  Location: [facility];  Service: Podiatry;  Laterality: Right;   AMPUTATION TOE Left 04/22/2021   Procedure: AMPUTATION TOE - 4TH METARSOPHANGEAL JOINT;  Surgeon: Caroline More, DPM;  Location: [facility];  Service: Podiatry;  Laterality: Left;   APPENDECTOMY     BREAST REDUCTION SURGERY  2001   CATARACT EXTRACTION     CESAREAN SECTION  1976   COLONOSCOPY  03/05/2013   Nml - due for repeat 03/06/2018   COLONOSCOPY WITH PROPOFOL N/A 03/18/2019   Procedure: COLONOSCOPY WITH  PROPOFOL;  Surgeon: Toledo, Benay Pike, MD;  Location: [facility];  Service: Gastroenterology;  Laterality: N/A;   DIAGNOSTIC LAPAROSCOPY     DILATION AND CURETTAGE OF UTERUS  1989   ENDARTERECTOMY FEMORAL Bilateral 03/09/2018   Procedure: ENDARTERECTOMY FEMORAL;  Surgeon: Algernon Huxley, MD;  Location: [facility];  Service: Vascular;  Laterality: Bilateral;   ENDARTERECTOMY POPLITEAL Left 03/09/2018   Procedure: ENDARTERECTOMY POPLITEAL AND SFA;   Surgeon: Algernon Huxley, MD;  Location: [facility];  Service: Vascular;  Laterality: Left;   ESOPHAGOGASTRODUODENOSCOPY  03/05/2013   ESOPHAGOGASTRODUODENOSCOPY (EGD) WITH PROPOFOL N/A 03/18/2019   Procedure: ESOPHAGOGASTRODUODENOSCOPY (EGD) WITH PROPOFOL;  Surgeon: Toledo, Benay Pike, MD;  Location: [facility];  Service: Gastroenterology;  Laterality: N/A;   EYE SURGERY     Eyelid Surgery  2012   INTRAMEDULLARY (IM) NAIL INTERTROCHANTERIC Left 10/30/2015   Procedure: INTRAMEDULLARY (IM) NAIL INTERTROCHANTRIC ;  Surgeon: Hessie Knows, MD;  Location: [facility];  Service: Orthopedics;  Laterality: Left;   KYPHOPLASTY N/A 10/25/2018   Procedure: L4 KYPHOPLASTY;  Surgeon: Hessie Knows, MD;  Location: [facility];  Service: Orthopedics;  Laterality: N/A;   LAPAROSCOPIC HYSTERECTOMY  2000   total   LOWER EXTREMITY ANGIOGRAPHY Left 03/08/2017   Procedure: LOWER EXTREMITY ANGIOGRAPHY;  Surgeon: Algernon Huxley, MD;  Location: [place] [facility];  Service: Cardiovascular;  Laterality: Left;   LOWER EXTREMITY ANGIOGRAPHY Left 10/30/2017   Procedure: LOWER EXTREMITY ANGIOGRAPHY;  Surgeon: Algernon Huxley, MD;  Location: [place] [facility];  Service: Cardiovascular;  Laterality: Left;   LOWER EXTREMITY ANGIOGRAPHY Right 03/08/2018   Procedure: LOWER EXTREMITY ANGIOGRAPHY;  Surgeon: Algernon Huxley, MD;  Location: [place] [facility];  Service: Cardiovascular;  Laterality: Right;   LOWER EXTREMITY ANGIOGRAPHY Left 10/01/2018   Procedure: LOWER EXTREMITY ANGIOGRAPHY;  Surgeon: Algernon Huxley, MD;  Location: [place] [facility];  Service: Cardiovascular;  Laterality: Left;   LOWER EXTREMITY ANGIOGRAPHY Right 10/08/2018   Procedure: LOWER EXTREMITY ANGIOGRAPHY;  Surgeon: Algernon Huxley, MD;  Location: [place] [facility];  Service: Cardiovascular;  Laterality: Right;   LOWER EXTREMITY ANGIOGRAPHY Right 05/07/2020   Procedure: Lower Extremity Angiography;  Surgeon: Algernon Huxley, MD;  Location: [place] [facility];  Service:  Cardiovascular;  Laterality: Right;   LYSIS OF ADHESION  01/25/2021   Procedure: LYSIS OF ADHESION;  Surgeon: Ronny Bacon, MD;  Location: [facility];  Service: General;;   PERIPHERAL VASCULAR INTERVENTION  03/08/2018   Procedure: PERIPHERAL VASCULAR INTERVENTION;  Surgeon: Algernon Huxley, MD;  Location: [place] [facility];  Service: Cardiovascular;;   PORTA CATH INSERTION N/A 02/17/2020   Procedure: PORTA CATH INSERTION;  Surgeon: Algernon Huxley, MD;  Location: [place] [facility];  Service: Cardiovascular;  Laterality: N/A;   REDUCTION MAMMAPLASTY  1997   SACROPLASTY N/A 10/25/2018   Procedure: S1 SACROPLASTY;  Surgeon: Hessie Knows, MD;  Location: [facility];  Service: Orthopedics;  Laterality: N/A;    SOCIAL HISTORY: Social History   Socioeconomic History   Marital status: Married    Spouse name: [name]   Number of children: 3   Years of education: Not on file   Highest education level: Not on file  Occupational History   Occupation: Retail banker  Tobacco Use   Smoking status: Former    Packs/day: 1.00    Years: 20.00    Total pack years: 20.00    Types: Cigarettes    Quit date: 03/07/1996    Years since quitting: 25.7  Passive exposure: Past   Smokeless tobacco: Never   Tobacco comments:    started smoking at age 80 but stopped smoking in 2000  Vaping Use   Vaping Use: Never used  Substance and Sexual Activity   Alcohol use: No    Alcohol/week: 0.0 standard drinks of alcohol   Drug use: No   Sexual activity: Yes    Partners: Male    Birth control/protection: Surgical  Other Topics Concern   Not on file  Social History Narrative   Lives in [place]; with husband; quit > 20 years; no alcohol; used to work at [employer]  at [employer].    Social Determinants of Health   Financial Resource Strain: Low Risk  (10/08/2018)   Overall Financial Resource Strain (CARDIA)    Difficulty of Paying Living Expenses: Not very hard  Food Insecurity: No Food Insecurity  (10/08/2018)   Hunger Vital Sign    Worried About Running Out of Food in the Last Year: Never true    Ran Out of Food in the Last Year: Never true  Transportation Needs: Unknown (10/01/2018)   PRAPARE - Hydrologist (Medical): No    Lack of Transportation (Non-Medical): Not on file  Physical Activity: Unknown (10/01/2018)   Exercise Vital Sign    Days of Exercise per Week: 2 days    Minutes of Exercise per Session: Not on file  Stress: Stress Concern Present (10/01/2018)   [place]    Feeling of Stress : To some extent  Social Connections: Unknown (10/08/2018)   Social Connection and Isolation Panel [NHANES]    Frequency of Communication with Friends and Family: More than three times a week    Frequency of Social Gatherings with Friends and Family: Not on file    Attends Religious Services: Not on file    Active Member of Clubs or Organizations: Not on file    Attends Archivist Meetings: Not on file    Marital Status: Married  Intimate Partner Violence: Not At Risk (10/08/2018)   Humiliation, Afraid, Rape, and Kick questionnaire    Fear of Current or Ex-Partner: No    Emotionally Abused: No    Physically Abused: No    Sexually Abused: No    FAMILY HISTORY: Family History  Problem Relation Age of Onset   Coronary artery disease Father    Heart attack Father    Coronary artery disease Mother    Heart attack Mother    Ovarian cancer Sister 38       sister had hormonal therapy for IVF txs-which increased risk factor for ovarian cancer   Breast cancer Neg Hx     ALLERGIES:  has No Known Allergies.  MEDICATIONS:  Current Outpatient Medications  Medication Sig Dispense Refill   ALPRAZolam (XANAX) 0.25 MG tablet Take 0.25 mg by mouth daily as needed for anxiety.     amLODipine (NORVASC) 5 MG tablet Take 5 mg by mouth 2 (two) times daily.     aspirin EC 81 MG tablet Take 1  tablet (81 mg total) by mouth daily. 30 tablet 11   cholecalciferol (VITAMIN D) 1000 units tablet Take 1,000 Units by mouth 2 (two) times daily.     CORAL CALCIUM PO Take 1 tablet by mouth daily.     denosumab (PROLIA) 60 MG/ML SOLN injection Inject 60 mg into the skin every 6 (six) months.      escitalopram (LEXAPRO) 10 MG  tablet Take 10 mg by mouth daily.     estradiol (ESTRACE) 0.1 MG/GM vaginal cream Place 1 Applicatorful vaginally daily as needed (vaginal irritation).     famotidine (PEPCID) 40 MG tablet Take 40 mg by mouth daily.     furosemide (LASIX) 20 MG tablet Take 20 mg by mouth every Wednesday.     gabapentin (NEURONTIN) 300 MG capsule Take 300 mg by mouth at bedtime as needed (pain).     HYDROcodone-acetaminophen (NORCO/VICODIN) 5-325 MG tablet Take 1 tablet by mouth daily.     levothyroxine (SYNTHROID, LEVOTHROID) 100 MCG tablet Take 100 mcg by mouth daily before breakfast.   3   lidocaine-prilocaine (EMLA) cream Apply 1 application topically as needed (apply prior to port a cath access). 30 g 3   Magnesium 500 MG TABS Take 500 mg by mouth every morning.      metFORMIN (GLUCOPHAGE) 1000 MG tablet Take 1,000 mg by mouth 2 (two) times daily with a meal.     metoprolol succinate (TOPROL-XL) 50 MG 24 hr tablet Take 50 mg by mouth daily. Take with or immediately following a meal.     mirtazapine (REMERON) 15 MG tablet Take 15 mg by mouth as needed.     Multiple Vitamin (MULTIVITAMIN WITH MINERALS) TABS tablet Take 1 tablet by mouth daily. Centrum Silver     nystatin cream (MYCOSTATIN) Apply 1 application topically daily as needed (Yeast infection).     olmesartan (BENICAR) 20 MG tablet Take 20 mg by mouth daily.     pantoprazole (PROTONIX) 40 MG tablet Take 40 mg by mouth daily.     pantoprazole sodium (PROTONIX) 40 mg Take 40 mg by mouth every morning.      rivaroxaban (XARELTO) 20 MG TABS tablet Take 1 tablet (20 mg total) by mouth daily with supper. 90 tablet 3   rosuvastatin  (CRESTOR) 20 MG tablet Take 20 mg by mouth every morning.     TRESIBA FLEXTOUCH 200 UNIT/ML SOPN Inject 25-30 Units as directed at bedtime. Titrate according to fasting blood glucose not to exceed 50 units a day  5   vitamin B-12 (CYANOCOBALAMIN) 1000 MCG tablet Take 1,000 mcg by mouth daily.     vitamin C (ASCORBIC ACID) 250 MG tablet Take 250 mg by mouth daily.     vitamin E 400 UNIT capsule Take 400 Units by mouth daily.     zolpidem (AMBIEN) 10 MG tablet Take 10 mg by mouth at bedtime.     No current facility-administered medications for this visit.      PHYSICAL EXAMINATION:   Vitals:   11/16/21 1444  BP: (!) 135/47  Pulse: 74  Temp: (!) 96.8 F (36 C)   Filed Weights   11/16/21 1444  Weight: 162 lb (73.5 kg)    Physical Exam Constitutional:      Comments: Alone.  Ambulating independently.  HENT:     Head: Normocephalic and atraumatic.     Mouth/Throat:     Pharynx: No oropharyngeal exudate.  Eyes:     Pupils: Pupils are equal, round, and reactive to light.  Cardiovascular:     Rate and Rhythm: Normal rate and regular rhythm.  Pulmonary:     Effort: Pulmonary effort is normal. No respiratory distress.     Breath sounds: Normal breath sounds. No wheezing.  Abdominal:     General: Bowel sounds are normal. There is no distension.     Palpations: Abdomen is soft. There is no mass.  Tenderness: There is no abdominal tenderness. There is no guarding or rebound.  Musculoskeletal:        General: No tenderness. Normal range of motion.     Cervical back: Normal range of motion and neck supple.  Skin:    General: Skin is warm.  Neurological:     Mental Status: She is alert and oriented to person, place, and time.  Psychiatric:        Mood and Affect: Affect normal.     LABORATORY DATA:  I have reviewed the data as listed Lab Results  Component Value Date   WBC 12.9 (H) 10/05/2021   HGB 9.1 (L) 11/04/2021   HCT 29.4 (L) 11/04/2021   MCV 85.9 10/05/2021    PLT 381 10/05/2021   Recent Labs    01/11/21 1317 01/25/21 1349 04/14/21 1135 07/12/21 0900 10/05/21 1426  NA 134*   < > 138 134* 136  K 4.6   < > 4.3 4.0 4.5  CL 102   < > 105 102 108  CO2 24   < > 24 22 21*  GLUCOSE 279*   < > 165* 216* 167*  BUN 34*   < > 23 37* 43*  CREATININE 1.33*   < > 1.39* 1.37* 1.52*  CALCIUM 9.7   < > 9.9 9.4 9.3  GFRNONAA 42*   < > 39* 40* 35*  PROT 7.1  --   --   --   --   ALBUMIN 3.7  --   --   --   --   AST 20  --   --   --   --   ALT 18  --   --   --   --   ALKPHOS 63  --   --   --   --   BILITOT 0.6  --   --   --   --    < > = values in this interval not displayed.      Intravitreal Injection, Pharmacologic Agent - OD - Right Eye  Result Date: 11/03/2021 Time Out 11/03/2021. 4:10 PM. Confirmed correct patient, procedure, site, and patient consented. Anesthesia Topical anesthesia was used. Anesthetic medications included Lidocaine 2%, Proparacaine 0.5%. Procedure Preparation included 5% betadine to ocular surface, eyelid speculum. A (32 g) needle was used. Injection: 2 mg aflibercept 2 MG/0.05ML   Route: Intravitreal, Site: Right Eye   NDC: [id number], Lot: [id number], Expiration date: 02/03/2023, Waste: 0 mL Post-op Post injection exam found visual acuity of at least counting fingers. The patient tolerated the procedure well. There were no complications. The patient received written and verbal post procedure care education. Post injection medications were not given.   OCT, Retina - OU - Both Eyes  Result Date: 11/03/2021 Right Eye Quality was good. Central Foveal Thickness: 390. Progression has improved. Findings include no SRF, abnormal foveal contour, epiretinal membrane, intraretinal fluid (Persistent RF/central edema -- slightly improved). Left Eye Quality was good. Central Foveal Thickness: 288. Progression has been stable. Findings include normal foveal contour, no IRF, no SRF (Trace ERM). Notes *Images captured and stored on drive Diagnosis  / Impression: OD: BRVO w/ CME - Persistent RF/central edema -- slightly improved OS: NFP; no IRF/SRF--stable, trace ERM Clinical management: See below Abbreviations: NFP - Normal foveal profile. CME - cystoid macular edema. PED - pigment epithelial detachment. IRF - intraretinal fluid. SRF - subretinal fluid. EZ - ellipsoid zone. ERM - epiretinal membrane. ORA - outer retinal atrophy. ORT - outer retinal  tubulation. SRHM - subretinal hyper-reflective material   VAS [place] ABI WITH/WO TBI  Result Date: 10/22/2021  LOWER EXTREMITY DOPPLER STUDY Patient Name:  Cheryl Leonard  Date of Exam:   10/21/2021 Medical Rec #: [id number]            Accession #:    [id number] Date of Birth: 07/13/1945             Patient Gender: F Patient Age:   20 years Exam Location:  [facility] Procedure:      VAS [place] ABI WITH/WO TBI Referring Phys: --------------------------------------------------------------------------------  Indications: Rest pain, and peripheral artery disease.  Vascular Interventions: On 03/08/2017 Aortogram - PTA of the left SFA with                         angioplasty/balloon. Stent placement to the left SFA.                         PTA of the left CIA with angioplasty/balloon.                         10/30/2017 PTA of left SFA and proximal popliteal                         artery. Left SFA and proximal popliteal artery stent.                          10/01/2018:Aortogram and Selective Left Lower Extremity                         Angiogram. Thrombolytic therapy TPA instilled in the                         Left SFA and Popliteal Artery. Mechanical Thrombectomy                         to the Left SFA and Popliteal Artery. PTA of the Left                         External Iliac Artery and CFA. PTA of the Left SFA and                         Proximal Popliteal Artery. Stent placement to the                         Popliteal Artery and Distal SFA. Stent placed to the                         Left SFA.                           10/08/2018: Aortogram and Selctive Right Lower Extremity                         Angiogram. Mechanical Thrombectomy of the Right SFA,                         Popliteal Artery and Posterior tibial Artery. PTA of the  Right SFA and Proximal Popliteal Artery. Stent placement                         to the Distal SFAand Proximal Popliteal Artery. Stent                         placement to the Right SFA. PTA of the Right Posterior                         Tibial Artery. placement. Performing Technologist: Concha Norway RVT  Examination Guidelines: A complete evaluation includes at minimum, Doppler waveform signals and systolic blood pressure reading at the level of bilateral brachial, anterior tibial, and posterior tibial arteries, when vessel segments are accessible. Bilateral testing is considered an integral part of a complete examination. Photoelectric Plethysmograph (PPG) waveforms and toe systolic pressure readings are included as required and additional duplex testing as needed. Limited examinations for reoccurring indications may be performed as noted.  ABI Findings: +---------+------------------+-----+---------+--------+ Right    Rt Pressure (mmHg)IndexWaveform Comment  +---------+------------------+-----+---------+--------+ Brachial 127                                      +---------+------------------+-----+---------+--------+ ATA      137               1.08 triphasic         +---------+------------------+-----+---------+--------+ PTA      143               1.13 triphasic         +---------+------------------+-----+---------+--------+ Great Toe91                0.72 Normal            +---------+------------------+-----+---------+--------+ +---------+------------------+-----+---------+-------+ Left     Lt Pressure (mmHg)IndexWaveform Comment +---------+------------------+-----+---------+-------+ Brachial 125                                      +---------+------------------+-----+---------+-------+ ATA      137               1.08 triphasic        +---------+------------------+-----+---------+-------+ PTA      146               1.15 triphasic        +---------+------------------+-----+---------+-------+ Great Toe109               0.86 Normal           +---------+------------------+-----+---------+-------+ +-------+-----------+-----------+------------+------------+ ABI/TBIToday's ABIToday's TBIPrevious ABIPrevious TBI +-------+-----------+-----------+------------+------------+ Right  1.13       .71        1.07        .80          +-------+-----------+-----------+------------+------------+ Left   1.15       .86        1.22        .90          +-------+-----------+-----------+------------+------------+ Bilateral ABIs and TBIs appear essentially unchanged compared to prior study on 07/2021.  Summary: Right: Resting right ankle-brachial index is within normal range. The right toe-brachial index is normal. Left: Resting left ankle-brachial index is within normal range. The left toe-brachial index is normal. *See table(s) above for  measurements and observations.  Electronically signed by Leotis Pain MD on 10/22/2021 at 8:59:47 AM.    Final      Assessment & Plan:  No problem-specific Assessment & Plan notes found for this encounter.  Normocytic anemia: # Anemia- likely secondary to CKD, stage III and iron deficiency. Has previously received intermittent iron infusions however, hemoglobin has been worse more recently. Started retacrit 20,000 every other week. Tolerating well however, hemoglobin again is 8.5. Recommended adjusting dose or frequency of retacrit. Hold bone marrow. She has several upcoming trips planned and we will adjust dosing around these trips. She prefers every other week dosing when possible. Proceed with venofer today, later this week, again next week. She will receive retacrit next week as well.  This is in anticipation of her being out of town from late November to mid December. I reached out to her daughter, Catalina Lunger, who is an NP in [place], to see if patient could receive labs, retacrit while visiting. She can coordinate labs but is unsure of retacrit. Will discuss options with Dr Rogue Bussing as well. Currently, patient is not scheduled to be back in [place] until mid- Dec. Question her being able to go 3 weeks without EPO or iron given drop in her counts more recently.    # Etiology-CKD-III; GFR ~32, slightly worse but generally stable. Recommended she schedule follow upw ith Dr. Candiss Norse (Covering for Dr. Juleen China) for appointment. Continue hdyration.    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 Lucky Cowboy.    #Poor IV access/Mediport placement-stable   DISPOSITION:  Venofer today, retacrit tomorrow. Venofer later this week then again on Monday Tuesday next week- lab (H&H), +/- retacrit RTC on Dec 18th for labs (cbc, bmp, ferritin, iron studies, ldh), Dr Rogue Bussing, +/- retacrit or venofer- la   All questions were answered. The patient knows to call the clinic with any problems, questions or concerns.   Verlon Au, NP 11/16/2021

## 2021-11-16 NOTE — Patient Instructions (Addendum)
Please contact your nephrologist, [facility] at [phone], for an appointment.

## 2021-11-17 ENCOUNTER — Inpatient Hospital Stay: Payer: Medicare Other

## 2021-11-17 VITALS — BP 117/51 | HR 71

## 2021-11-17 DIAGNOSIS — I129 Hypertensive chronic kidney disease with stage 1 through stage 4 chronic kidney disease, or unspecified chronic kidney disease: Secondary | ICD-10-CM | POA: Diagnosis not present

## 2021-11-17 DIAGNOSIS — N183 Chronic kidney disease, stage 3 unspecified: Secondary | ICD-10-CM

## 2021-11-17 LAB — HAPTOGLOBIN: Haptoglobin: 158 mg/dL (ref 42–346)

## 2021-11-17 MED ORDER — EPOETIN ALFA-EPBX 20000 UNIT/ML IJ SOLN
20000.0000 [IU] | Freq: Once | INTRAMUSCULAR | Status: AC
  Administered 2021-11-17: 20000 [IU] via SUBCUTANEOUS
  Filled 2021-11-17: qty 1

## 2021-11-19 ENCOUNTER — Inpatient Hospital Stay: Payer: Medicare Other

## 2021-11-19 VITALS — BP 119/52 | HR 69 | Temp 96.9°F | Resp 16

## 2021-11-19 DIAGNOSIS — D631 Anemia in chronic kidney disease: Secondary | ICD-10-CM

## 2021-11-19 DIAGNOSIS — I129 Hypertensive chronic kidney disease with stage 1 through stage 4 chronic kidney disease, or unspecified chronic kidney disease: Secondary | ICD-10-CM | POA: Diagnosis not present

## 2021-11-19 MED ORDER — ALTEPLASE 2 MG IJ SOLR
2.0000 mg | Freq: Once | INTRAMUSCULAR | Status: DC | PRN
  Filled 2021-11-19: qty 2

## 2021-11-19 MED ORDER — SODIUM CHLORIDE 0.9% FLUSH
10.0000 mL | Freq: Once | INTRAVENOUS | Status: DC | PRN
  Filled 2021-11-19: qty 10

## 2021-11-19 MED ORDER — SODIUM CHLORIDE 0.9 % IV SOLN
200.0000 mg | Freq: Once | INTRAVENOUS | Status: AC
  Administered 2021-11-19: 200 mg via INTRAVENOUS
  Filled 2021-11-19: qty 200

## 2021-11-19 MED ORDER — SODIUM CHLORIDE 0.9% FLUSH
3.0000 mL | Freq: Once | INTRAVENOUS | Status: DC | PRN
  Filled 2021-11-19: qty 3

## 2021-11-19 MED ORDER — HEPARIN SOD (PORK) LOCK FLUSH 100 UNIT/ML IV SOLN
INTRAVENOUS | Status: AC
  Administered 2021-11-19: 500 [IU]
  Filled 2021-11-19: qty 5

## 2021-11-19 MED ORDER — HEPARIN SOD (PORK) LOCK FLUSH 100 UNIT/ML IV SOLN
500.0000 [IU] | Freq: Once | INTRAVENOUS | Status: AC | PRN
  Filled 2021-11-19: qty 5

## 2021-11-19 MED ORDER — SODIUM CHLORIDE 0.9 % IV SOLN
Freq: Once | INTRAVENOUS | Status: AC
  Filled 2021-11-19: qty 250

## 2021-11-19 MED ORDER — HEPARIN SOD (PORK) LOCK FLUSH 100 UNIT/ML IV SOLN
250.0000 [IU] | Freq: Once | INTRAVENOUS | Status: DC | PRN
  Filled 2021-11-19: qty 5

## 2021-11-19 MED FILL — Iron Sucrose Inj 20 MG/ML (Fe Equiv): INTRAVENOUS | Qty: 10 | Status: AC

## 2021-11-22 ENCOUNTER — Inpatient Hospital Stay: Payer: Medicare Other

## 2021-11-22 VITALS — BP 134/50 | HR 67 | Temp 97.5°F | Resp 18

## 2021-11-22 DIAGNOSIS — D631 Anemia in chronic kidney disease: Secondary | ICD-10-CM

## 2021-11-22 DIAGNOSIS — N183 Chronic kidney disease, stage 3 unspecified: Secondary | ICD-10-CM

## 2021-11-22 DIAGNOSIS — I129 Hypertensive chronic kidney disease with stage 1 through stage 4 chronic kidney disease, or unspecified chronic kidney disease: Secondary | ICD-10-CM | POA: Diagnosis not present

## 2021-11-22 MED ORDER — HEPARIN SOD (PORK) LOCK FLUSH 100 UNIT/ML IV SOLN
500.0000 [IU] | Freq: Once | INTRAVENOUS | Status: AC | PRN
  Administered 2021-11-22: 500 [IU]
  Filled 2021-11-22: qty 5

## 2021-11-22 MED ORDER — SODIUM CHLORIDE 0.9 % IV SOLN
Freq: Once | INTRAVENOUS | Status: AC
  Filled 2021-11-22: qty 250

## 2021-11-22 MED ORDER — SODIUM CHLORIDE 0.9 % IV SOLN
200.0000 mg | Freq: Once | INTRAVENOUS | Status: AC
  Administered 2021-11-22: 200 mg via INTRAVENOUS
  Filled 2021-11-22: qty 200

## 2021-11-22 MED ORDER — SODIUM CHLORIDE 0.9% FLUSH
10.0000 mL | Freq: Once | INTRAVENOUS | Status: AC | PRN
  Administered 2021-11-22: 10 mL
  Filled 2021-11-22: qty 10

## 2021-11-23 ENCOUNTER — Other Ambulatory Visit: Payer: Self-pay | Admitting: *Deleted

## 2021-11-23 ENCOUNTER — Inpatient Hospital Stay: Payer: Medicare Other

## 2021-11-23 VITALS — BP 126/82 | HR 83

## 2021-11-23 DIAGNOSIS — D631 Anemia in chronic kidney disease: Secondary | ICD-10-CM

## 2021-11-23 DIAGNOSIS — D649 Anemia, unspecified: Secondary | ICD-10-CM

## 2021-11-23 DIAGNOSIS — Z95828 Presence of other vascular implants and grafts: Secondary | ICD-10-CM

## 2021-11-23 DIAGNOSIS — I129 Hypertensive chronic kidney disease with stage 1 through stage 4 chronic kidney disease, or unspecified chronic kidney disease: Secondary | ICD-10-CM | POA: Diagnosis not present

## 2021-11-23 LAB — HEMOGLOBIN AND HEMATOCRIT, BLOOD
HCT: 26 % — ABNORMAL LOW (ref 36.0–46.0)
Hemoglobin: 7.9 g/dL — ABNORMAL LOW (ref 12.0–15.0)

## 2021-11-23 MED ORDER — SODIUM CHLORIDE 0.9% FLUSH
10.0000 mL | Freq: Once | INTRAVENOUS | Status: DC
  Filled 2021-11-23: qty 10

## 2021-11-23 MED ORDER — EPOETIN ALFA-EPBX 20000 UNIT/ML IJ SOLN
20000.0000 [IU] | Freq: Once | INTRAMUSCULAR | Status: AC
  Administered 2021-11-23: 20000 [IU] via SUBCUTANEOUS
  Filled 2021-11-23: qty 1

## 2021-11-23 MED ORDER — HEPARIN SOD (PORK) LOCK FLUSH 100 UNIT/ML IV SOLN
500.0000 [IU] | Freq: Once | INTRAVENOUS | Status: AC
  Administered 2021-11-23: 500 [IU] via INTRAVENOUS
  Filled 2021-11-23: qty 5

## 2021-11-28 ENCOUNTER — Encounter (INDEPENDENT_AMBULATORY_CARE_PROVIDER_SITE_OTHER): Payer: Self-pay

## 2021-11-30 ENCOUNTER — Telehealth: Payer: Self-pay | Admitting: Nurse Practitioner

## 2021-11-30 ENCOUNTER — Encounter: Payer: Self-pay | Admitting: Nurse Practitioner

## 2021-11-30 DIAGNOSIS — N183 Chronic kidney disease, stage 3 unspecified: Secondary | ICD-10-CM

## 2021-11-30 NOTE — Telephone Encounter (Signed)
Called patient's daughter with results of interval lab results. Hmg has improved, now 8.9. Patient is feeling well. Follow up as scheduled.

## 2021-12-02 ENCOUNTER — Encounter (INDEPENDENT_AMBULATORY_CARE_PROVIDER_SITE_OTHER): Payer: Self-pay

## 2021-12-03 ENCOUNTER — Encounter (INDEPENDENT_AMBULATORY_CARE_PROVIDER_SITE_OTHER): Payer: Medicare Other | Admitting: Ophthalmology

## 2021-12-03 ENCOUNTER — Other Ambulatory Visit: Payer: Self-pay | Admitting: *Deleted

## 2021-12-03 DIAGNOSIS — D649 Anemia, unspecified: Secondary | ICD-10-CM

## 2021-12-03 MED FILL — Iron Sucrose Inj 20 MG/ML (Fe Equiv): INTRAVENOUS | Qty: 10 | Status: AC

## 2021-12-03 NOTE — Progress Notes (Signed)
[facility] - Clinic Note  12/06/2021     CHIEF COMPLAINT Patient presents for Retina Follow Up   HISTORY OF PRESENT ILLNESS: Cheryl Leonard is a 76 y.o. female who presents to the clinic today for:    HPI     Retina Follow Up   Patient presents with  CRVO/BRVO.  In right eye.  This started 4 weeks ago.  Duration of 4 weeks.  I, the attending physician,  performed the HPI with the patient and updated documentation appropriately.        Comments   4 week retina follow up BRVO and SME OD I'VE OD PT states she has been having little harder time with reading fine print       Last edited by [name], MD on 12/08/2021 12:45 AM.    Pt feels like she is having a hard time reading, she has been put on Farxiga since she was here last, her hemoglobin was 10.1 last time it was checked  Referring physician: Danella Penton, MD [phone] [facility] [address] [facility] [place],  [place] [address]   HISTORICAL INFORMATION:   Selected notes from the MEDICAL RECORD NUMBER Referred by Dr. Senaida Ores for concern of DME OD Lab Results  Component Value Date   HGBA1C 7.3 (H) 01/11/2021     CURRENT MEDICATIONS: No current outpatient medications on file. (Ophthalmic Drugs)   No current facility-administered medications for this visit. (Ophthalmic Drugs)   Current Outpatient Medications (Other)  Medication Sig   ALPRAZolam (XANAX) 0.25 MG tablet Take 0.25 mg by mouth daily as needed for anxiety.   amLODipine (NORVASC) 5 MG tablet Take 5 mg by mouth 2 (two) times daily.   aspirin EC 81 MG tablet Take 1 tablet (81 mg total) by mouth daily.   cholecalciferol (VITAMIN D) 1000 units tablet Take 1,000 Units by mouth 2 (two) times daily.   CORAL CALCIUM PO Take 1 tablet by mouth daily.   denosumab (PROLIA) 60 MG/ML SOLN injection Inject 60 mg into the skin every 6 (six) months.    escitalopram (LEXAPRO) 10 MG tablet Take 10 mg by mouth daily.    estradiol (ESTRACE) 0.1 MG/GM vaginal cream Place 1 Applicatorful vaginally daily as needed (vaginal irritation).   famotidine (PEPCID) 40 MG tablet Take 40 mg by mouth daily.   furosemide (LASIX) 20 MG tablet Take 20 mg by mouth every Wednesday.   gabapentin (NEURONTIN) 300 MG capsule Take 300 mg by mouth at bedtime as needed (pain).   HYDROcodone-acetaminophen (NORCO/VICODIN) 5-325 MG tablet Take 1 tablet by mouth daily.   levothyroxine (SYNTHROID, LEVOTHROID) 100 MCG tablet Take 100 mcg by mouth daily before breakfast.    lidocaine-prilocaine (EMLA) cream Apply 1 application topically as needed (apply prior to port a cath access).   Magnesium 500 MG TABS Take 500 mg by mouth every morning.    metFORMIN (GLUCOPHAGE) 1000 MG tablet Take 1,000 mg by mouth 2 (two) times daily with a meal.   metoprolol succinate (TOPROL-XL) 50 MG 24 hr tablet Take 50 mg by mouth daily. Take with or immediately following a meal.   mirtazapine (REMERON) 15 MG tablet Take 15 mg by mouth as needed.   Multiple Vitamin (MULTIVITAMIN WITH MINERALS) TABS tablet Take 1 tablet by mouth daily. Centrum Silver   nystatin cream (MYCOSTATIN) Apply 1 application topically daily as needed (Yeast infection).   olmesartan (BENICAR) 20 MG tablet Take 20 mg by mouth daily.   pantoprazole (PROTONIX)  40 MG tablet Take 40 mg by mouth daily.   pantoprazole sodium (PROTONIX) 40 mg Take 40 mg by mouth every morning.    rivaroxaban (XARELTO) 20 MG TABS tablet Take 1 tablet (20 mg total) by mouth daily with supper.   rosuvastatin (CRESTOR) 20 MG tablet Take 20 mg by mouth every morning.   TRESIBA FLEXTOUCH 200 UNIT/ML SOPN Inject 25-30 Units as directed at bedtime. Titrate according to fasting blood glucose not to exceed 50 units a day   vitamin B-12 (CYANOCOBALAMIN) 1000 MCG tablet Take 1,000 mcg by mouth daily.   vitamin C (ASCORBIC ACID) 250 MG tablet Take 250 mg by mouth daily.   vitamin E 400 UNIT capsule Take 400 Units by mouth daily.    zolpidem (AMBIEN) 10 MG tablet Take 10 mg by mouth at bedtime.   No current facility-administered medications for this visit. (Other)   REVIEW OF SYSTEMS:   ALLERGIES No Known Allergies  PAST MEDICAL HISTORY Past Medical History:  Diagnosis Date   Anemia    Anxiety    Arthritis    Gout   Cataracts, both eyes    Diabetic retinopathy (HCC)    NPDR OU   Diverticulitis    GERD (gastroesophageal reflux disease)    Gout    Headache    h/o migraines   History of fracture of patella    right knee   History of positive PPD    Patient always shows positive   Hyperlipidemia    Hypertension    Hypertensive retinopathy    OU   Hypothyroidism    Lichen sclerosus 12/30/2013   of vulva   Metatarsal fracture    Neuropathy    Osteopenia    Peripheral vascular disease (HCC)    Polyneuropathy    numbness and tingling in feet and toes   Renal insufficiency    Stage 3   Sleep apnea    does not use cpap-lost weight    Type 2 diabetes mellitus, uncontrolled    Past Surgical History:  Procedure Laterality Date   ABDOMINAL HYSTERECTOMY     AMPUTATION TOE Right 05/08/2020   Procedure: AMPUTATION TOE-Right 4th Toe;  Surgeon: Rosetta PosnerBaker, Andrew, DPM;  Location: [facility];  Service: Podiatry;  Laterality: Right;   AMPUTATION TOE Left 04/22/2021   Procedure: AMPUTATION TOE - 4TH METARSOPHANGEAL JOINT;  Surgeon: Rosetta PosnerBaker, Andrew, DPM;  Location: [facility];  Service: Podiatry;  Laterality: Left;   APPENDECTOMY     BREAST REDUCTION SURGERY  2001   CATARACT EXTRACTION     CESAREAN SECTION  1976   COLONOSCOPY  03/05/2013   Nml - due for repeat 03/06/2018   COLONOSCOPY WITH PROPOFOL N/A 03/18/2019   Procedure: COLONOSCOPY WITH PROPOFOL;  Surgeon: Toledo, Boykin Nearingeodoro K, MD;  Location: [facility];  Service: Gastroenterology;  Laterality: N/A;   DIAGNOSTIC LAPAROSCOPY     DILATION AND CURETTAGE OF UTERUS  1989   ENDARTERECTOMY FEMORAL Bilateral 03/09/2018   Procedure: ENDARTERECTOMY FEMORAL;  Surgeon: Annice Needyew,  Jason S, MD;  Location: [facility];  Service: Vascular;  Laterality: Bilateral;   ENDARTERECTOMY POPLITEAL Left 03/09/2018   Procedure: ENDARTERECTOMY POPLITEAL AND SFA;  Surgeon: Annice Needyew, Jason S, MD;  Location: [facility];  Service: Vascular;  Laterality: Left;   ESOPHAGOGASTRODUODENOSCOPY  03/05/2013   ESOPHAGOGASTRODUODENOSCOPY (EGD) WITH PROPOFOL N/A 03/18/2019   Procedure: ESOPHAGOGASTRODUODENOSCOPY (EGD) WITH PROPOFOL;  Surgeon: Toledo, Boykin Nearingeodoro K, MD;  Location: [facility];  Service: Gastroenterology;  Laterality: N/A;   EYE SURGERY     Eyelid Surgery  2012   INTRAMEDULLARY (IM) NAIL INTERTROCHANTERIC Left 10/30/2015   Procedure: INTRAMEDULLARY (IM) NAIL INTERTROCHANTRIC ;  Surgeon: Kennedy Bucker, MD;  Location: [facility];  Service: Orthopedics;  Laterality: Left;   KYPHOPLASTY N/A 10/25/2018   Procedure: L4 KYPHOPLASTY;  Surgeon: Kennedy Bucker, MD;  Location: [facility];  Service: Orthopedics;  Laterality: N/A;   LAPAROSCOPIC HYSTERECTOMY  2000   total   LOWER EXTREMITY ANGIOGRAPHY Left 03/08/2017   Procedure: LOWER EXTREMITY ANGIOGRAPHY;  Surgeon: Annice Needy, MD;  Location: [facility];  Service: Cardiovascular;  Laterality: Left;   LOWER EXTREMITY ANGIOGRAPHY Left 10/30/2017   Procedure: LOWER EXTREMITY ANGIOGRAPHY;  Surgeon: Annice Needy, MD;  Location: [facility];  Service: Cardiovascular;  Laterality: Left;   LOWER EXTREMITY ANGIOGRAPHY Right 03/08/2018   Procedure: LOWER EXTREMITY ANGIOGRAPHY;  Surgeon: Annice Needy, MD;  Location: [facility];  Service: Cardiovascular;  Laterality: Right;   LOWER EXTREMITY ANGIOGRAPHY Left 10/01/2018   Procedure: LOWER EXTREMITY ANGIOGRAPHY;  Surgeon: Annice Needy, MD;  Location: [facility];  Service: Cardiovascular;  Laterality: Left;   LOWER EXTREMITY ANGIOGRAPHY Right 10/08/2018   Procedure: LOWER EXTREMITY ANGIOGRAPHY;  Surgeon: Annice Needy, MD;  Location: [facility];  Service: Cardiovascular;  Laterality:  Right;   LOWER EXTREMITY ANGIOGRAPHY Right 05/07/2020   Procedure: Lower Extremity Angiography;  Surgeon: Annice Needy, MD;  Location: [facility];  Service: Cardiovascular;  Laterality: Right;   LYSIS OF ADHESION  01/25/2021   Procedure: LYSIS OF ADHESION;  Surgeon: Campbell Lerner, MD;  Location: [facility];  Service: General;;   PERIPHERAL VASCULAR INTERVENTION  03/08/2018   Procedure: PERIPHERAL VASCULAR INTERVENTION;  Surgeon: Annice Needy, MD;  Location: [facility];  Service: Cardiovascular;;   PORTA CATH INSERTION N/A 02/17/2020   Procedure: PORTA CATH INSERTION;  Surgeon: Annice Needy, MD;  Location: [facility];  Service: Cardiovascular;  Laterality: N/A;   REDUCTION MAMMAPLASTY  1997   SACROPLASTY N/A 10/25/2018   Procedure: S1 SACROPLASTY;  Surgeon: Kennedy Bucker, MD;  Location: [facility];  Service: Orthopedics;  Laterality: N/A;   FAMILY HISTORY Family History  Problem Relation Age of Onset   Coronary artery disease Father    Heart attack Father    Coronary artery disease Mother    Heart attack Mother    Ovarian cancer Sister 8       sister had hormonal therapy for IVF txs-which increased risk factor for ovarian cancer   Breast cancer Neg Hx    SOCIAL HISTORY Social History   Tobacco Use   Smoking status: Former    Packs/day: 1.00    Years: 20.00    Total pack years: 20.00    Types: Cigarettes    Quit date: 03/07/1996    Years since quitting: 25.7    Passive exposure: Past   Smokeless tobacco: Never   Tobacco comments:    started smoking at age 89 but stopped smoking in 2000  Vaping Use   Vaping Use: Never used  Substance Use Topics   Alcohol use: No    Alcohol/week: 0.0 standard drinks of alcohol   Drug use: No       OPHTHALMIC EXAM: Base Eye Exam     Visual Acuity (Snellen - Linear)       Right Left   Dist [place] 20/30 -2 20/25 -1         Tonometry (Tonopen, 2:04 PM)  Right Left   Pressure 12 13         Pupils        Pupils Dark Light Shape React APD   Right PERRL 3 2 Round Brisk None   Left PERRL 3 2 Round Brisk None         Visual Fields       Left Right    Full Full         Neuro/Psych     Oriented x3: Yes   Mood/Affect: Normal         Dilation     Both eyes: 2.5% Phenylephrine @ 2:00 PM           Slit Lamp and Fundus Exam     External Exam       Right Left   External Normal Normal         Slit Lamp Exam       Right Left   Lids/Lashes dermatochalasis dermatochalasis   Conjunctiva/Sclera White and quiet White and quiet   Cornea arcus; well healed cataract wound; 2-3+ diffuse Punctate epithelial erosions, decreased TBUT, mild Anterior basement membrane dystrophy superiorly arcus; well healed cataract wound, 2-3+ diffuse Punctate epithelial erosions, irregualr epi surface, decreased TBUT   Anterior Chamber Deep and quiet Deep and quiet   Iris Round and dilated Round and dilated   Lens PCIOL; open PC PCIOL; open PC   Anterior Vitreous syneresis, Posterior vitreous detachment, vitreous condensations inferiorly syneresis, Posterior vitreous detachment         Fundus Exam       Right Left   Disc Superior hyperemia, mild Pallor Pink and Sharp   C/D Ratio 0.6 0.5   Macula Flat, Blunted foveal reflex, +cystic changes - persistent, +Epiretinal membrane, minimal MA, PED flat; good foveal reflex, no heme or edema, small pigment clump IT to fovea   Vessels attenuated, Tortuous attenuated, Tortuous   Periphery Attached; scattered DBH -- greastest temporal periphery Attached, no heme           IMAGING AND PROCEDURES  Imaging and Procedures for 04/25/17  OCT, Retina - OU - Both Eyes       Right Eye Quality was good. Central Foveal Thickness: 393. Progression has worsened. Findings include no SRF, abnormal foveal contour, epiretinal membrane, intraretinal fluid (Persistent RF/central edema -- slightly increased).   Left Eye Quality was good. Central Foveal  Thickness: 283. Progression has been stable. Findings include normal foveal contour, no IRF, no SRF (Trace ERM).   Notes *Images captured and stored on drive  Diagnosis / Impression:  OD: BRVO w/ CME - Persistent RF/central edema -- slightly increased OS: NFP; no IRF/SRF--stable, trace ERM  Clinical management:  See below  Abbreviations: NFP - Normal foveal profile. CME - cystoid macular edema. PED - pigment epithelial detachment. IRF - intraretinal fluid. SRF - subretinal fluid. EZ - ellipsoid zone. ERM - epiretinal membrane. ORA - outer retinal atrophy. ORT - outer retinal tubulation. SRHM - subretinal hyper-reflective material      Intravitreal Injection, Pharmacologic Agent - OD - Right Eye       Time Out 12/06/2021. 2:50 PM. Confirmed correct patient, procedure, site, and patient consented.   Anesthesia Topical anesthesia was used. Anesthetic medications included Lidocaine 2%, Proparacaine 0.5%.   Procedure Preparation included 5% betadine to ocular surface, eyelid speculum. A (32 g) needle was used.   Injection: 6 mg faricimab-svoa 6 MG/0.05ML   Route: Intravitreal, Site: Right Eye   NDC: O8010301, Lot: [id number],  Expiration date: 01/03/2024, Waste: 0 mL   Post-op Post injection exam found visual acuity of at least counting fingers. The patient tolerated the procedure well. There were no complications. The patient received written and verbal post procedure care education. Post injection medications were not given.      Lactate dehydrogenase      Component Value Flag Ref Range Units Status   LDH 117      98 - 192 U/L Final   Comment:   Performed at [facility], [address] [place], [place] [address]           Iron and TIBC([facility]/Sunquest)      Component Value Flag Ref Range Units Status   Iron 49      28 - 170 ug/dL Final   TIBC 604      540 - 450 ug/dL Final   Saturation Ratios 14      10.4 - 31.8 % Final   UIBC 297       ug/dL Final    Comment:   Performed at [facility], [address] [place], [place] [address]           Ferritin      Component Value Flag Ref Range Units Status   Ferritin 66      11 - 307 ng/mL Final   Comment:   Performed at [facility], [address] [place], [place] [address]           Comprehensive metabolic panel      Component Value Flag Ref Range Units Status   Sodium 139      135 - 145 mmol/L Final   Potassium 4.7      3.5 - 5.1 mmol/L Final   Chloride 108      98 - 111 mmol/L Final   CO2 21      22 - 32 mmol/L Final   Glucose, Bld 136      70 - 99 mg/dL Final   Comment:   Glucose reference range applies only to samples taken after fasting for at least 8 hours.   BUN 30      8 - 23 mg/dL Final   Creatinine, Ser 1.62      0.44 - 1.00 mg/dL Final   Calcium 9.5      8.9 - 10.3 mg/dL Final   Total Protein 6.9      6.5 - 8.1 g/dL Final   Albumin 3.9      3.5 - 5.0 g/dL Final   AST 25      15 - 41 U/L Final   ALT 18      0 - 44 U/L Final   Alkaline Phosphatase 50      38 - 126 U/L Final   Total Bilirubin 0.6      0.3 - 1.2 mg/dL Final   GFR, Estimated 33      >60 mL/min Final   Comment:   (NOTE) Calculated using the CKD-EPI Creatinine Equation (2021)    Anion gap 10      5 - 15  Final   Comment:   Performed at [facility], [address] [place], [place] [address]           CBC with Differential/Platelet      Component Value Flag Ref Range Units Status   WBC 5.1      4.0 - 10.5 K/uL Final   RBC 3.47  3.87 - 5.11 MIL/uL Final   Hemoglobin 10.1      12.0 - 15.0 g/dL Final   HCT 50.5      39.7 - 46.0 % Final   MCV 93.9      80.0 - 100.0 fL Final   MCH 29.1      26.0 - 34.0 pg Final   MCHC 31.0      30.0 - 36.0 g/dL Final   RDW 67.3      41.9 - 15.5 % Final   Platelets 271      150 - 400 K/uL Final   nRBC 0.0      0.0 - 0.2 % Final   Neutrophils Relative % 67       % Final   Neutro Abs 3.4      1.7 - 7.7 K/uL Final    Lymphocytes Relative 17       % Final   Lymphs Abs 0.8      0.7 - 4.0 K/uL Final   Monocytes Relative 12       % Final   Monocytes Absolute 0.6      0.1 - 1.0 K/uL Final   Eosinophils Relative 3       % Final   Eosinophils Absolute 0.2      0.0 - 0.5 K/uL Final   Basophils Relative 1       % Final   Basophils Absolute 0.0      0.0 - 0.1 K/uL Final   Immature Granulocytes 0       % Final   Abs Immature Granulocytes 0.01      0.00 - 0.07 K/uL Final   Comment:   Performed at [facility], [address], [place], [place] [address]                 ASSESSMENT/PLAN:    ICD-10-CM   1. Branch retinal vein occlusion of right eye with macular edema  H34.8310 OCT, Retina - OU - Both Eyes    Intravitreal Injection, Pharmacologic Agent - OD - Right Eye    faricimab-svoa (VABYSMO) 6mg /0.78mL intravitreal injection    2. Both eyes affected by mild nonproliferative diabetic retinopathy with macular edema, associated with type 2 diabetes mellitus (HCC)  0m     3. Essential hypertension  I10     4. Hypertensive retinopathy of both eyes  H35.033     5. Epiretinal membrane (ERM) of right eye  H35.371     6. Pseudophakia of both eyes  Z96.1      1. BRVO w/ CME OD  - by history, pt states symptoms first noticed 2 wks prior to presentation, but reports changes may have occurred prior  - initial exam with differential tortuosity of vessels (OD > OS)  - FA (02.10.20) shows mild late staining / leakage in macula, staining / leakage of disc -- improving CME  - differential includes DM2 (DME), hypertensive retinopathy, inflammatory etiology / uveitis  - S/P IVA OD #1 (02.08.19), #2 (03.11.19), #3 (04.09.19), #4 (05.20.19), #5 (02.10.20)  - gave IVA OD on 2.10.20 due to pending Eylea4U for 2020 -- resulted in increased IRF/CME  - review of OCTs show persistent IRF and cystic changes --  resistance to IVA   - June 2019 -- switched therapies: S/P IVE OD #1 (06.24.19), #2 (07.24.19), #3  (09.04.19), #4 (10.30.19),#5 (12.30.19), #6 (03.23.20), #7 (05.05.20), #8 (07.16.20), #9 (07.17.20), #10 (08.28.20), #11 (10.13.20), # 12 (11.17.20), #13 (2.8.21), #14 (  03.09.21), #15 (04.13.21), #16 (05.11.21), #17 (06.17.21), #18 (07.23.21), #19 (08.30.21), #20 (10.04.21), #21 (11.08.21), #22 (12.08.21), #23 (01.31.22), #24 (02.28.22), #25 (04.01.22), #26 (06.15.22), #27 (07.13.22), #28 (08.17.22), #29 (09.21.22), #30 (10.19.22), #31 (11.16.22), #32 (12.16.22), #33 (01.13.23), #34 (02.10.23), #35 (03.15.23), #36 (04.14.23), #37 (05.15.23), #38 (06.12.23), #39 (07.10.23), #40 (08.07.23), #41 (09.06.23), #42 (10.04.23), #43 (11.01.23)  - OCT today shows Persistent RF/central edema -- slightly increased at 4 weeks  - BCVA 20/30 -- stable  - discussed IVE resistance and possible switch in medication  - recommend switching to Vabysmo OD #1 today, 12.04.23 w/ f/u in 4 weeks  - RBA of procedure discussed, questions answered  - informed consent obtained  - see procedure note   - Vabysmo informed consent form obtained, signed and scanned on 12.04.23  - Eylea informed consent form obtained, re-signed and scanned on 05.15.23  - Eylea4U benefits investigation completed and pt approved for IVE for 2023  - f/u 4 weeks  -- DFE/OCT/possible injection  2. Mild nonproliferative diabetic retinopathy, both eyes  - A1c 6.6 on 07.01.23, 6.0 on 10.14.22  - could be contributing to CME OD  - OS with minimal diabetic retinopathy  - continue to monitor  3,4. Hypertensive retinopathy OU - stable  - as above, may have contributing to CME OD  - discussed importance of tight BP control  - monitor  5. Epiretinal membrane, right eye   - stable nasal ERM  - no indication for surgery at this time  6. Pseudophakia OU  - s/p CE/IOL OU [facility]  - doing well  - monitor  Ophthalmic Meds Ordered this visit:  Meds ordered this encounter  Medications   faricimab-svoa (VABYSMO) /0.4mL intravitreal  injection     Return in about 4 weeks (around 01/03/2022) for f/u BRVO OD, DFE, OCT.  This document serves as a record of services personally performed by Karie Chimera, MD, PhD. It was created on their behalf by Glee Arvin. Manson Passey, OA an ophthalmic technician. The creation of this record is the provider's dictation and/or activities during the visit.    Electronically signed by: Glee Arvin. Manson Passey, [place] 12.01.2023 12:45 AM  Karie Chimera, M.D., Ph.D. Diseases & Surgery of the Retina and Vitreous [facility]  I have reviewed the above documentation for accuracy and completeness, and I agree with the above. Karie Chimera, M.D., Ph.D. 12/08/21 12:48 AM   Abbreviations: M myopia (nearsighted); A astigmatism; H hyperopia (farsighted); P presbyopia; Mrx spectacle prescription;  CTL contact lenses; OD right eye; OS left eye; OU both eyes  XT exotropia; ET esotropia; PEK punctate epithelial keratitis; PEE punctate epithelial erosions; DES dry eye syndrome; MGD meibomian gland dysfunction; ATs artificial tears; PFAT's preservative free artificial tears; NSC nuclear sclerotic cataract; PSC posterior subcapsular cataract; ERM epi-retinal membrane; PVD posterior vitreous detachment; RD retinal detachment; DM diabetes mellitus; DR diabetic retinopathy; NPDR non-proliferative diabetic retinopathy; PDR proliferative diabetic retinopathy; CSME clinically significant macular edema; DME diabetic macular edema; dbh dot blot hemorrhages; CWS cotton wool spot; POAG primary open angle glaucoma; C/D cup-to-disc ratio; HVF humphrey visual field; GVF goldmann visual field; OCT optical coherence tomography; IOP intraocular pressure; BRVO Branch retinal vein occlusion; CRVO central retinal vein occlusion; CRAO central retinal artery occlusion; BRAO branch retinal artery occlusion; RT retinal tear; SB scleral buckle; PPV pars plana vitrectomy; VH Vitreous hemorrhage; PRP panretinal laser photocoagulation;  IVK intravitreal kenalog; VMT vitreomacular traction; MH Macular hole;  NVD neovascularization of the disc;  NVE neovascularization elsewhere; AREDS age related eye disease study; ARMD age related macular degeneration; POAG primary open angle glaucoma; EBMD epithelial/anterior basement membrane dystrophy; ACIOL anterior chamber intraocular lens; IOL intraocular lens; PCIOL posterior chamber intraocular lens; Phaco/IOL phacoemulsification with intraocular lens placement; [place] photorefractive keratectomy; LASIK laser assisted in situ keratomileusis; HTN hypertension; DM diabetes mellitus; COPD chronic obstructive pulmonary disease

## 2021-12-06 ENCOUNTER — Inpatient Hospital Stay: Payer: Medicare Other | Attending: Internal Medicine

## 2021-12-06 ENCOUNTER — Encounter: Payer: Self-pay | Admitting: Internal Medicine

## 2021-12-06 ENCOUNTER — Encounter (INDEPENDENT_AMBULATORY_CARE_PROVIDER_SITE_OTHER): Payer: Self-pay | Admitting: Ophthalmology

## 2021-12-06 ENCOUNTER — Ambulatory Visit (INDEPENDENT_AMBULATORY_CARE_PROVIDER_SITE_OTHER): Payer: Medicare Other | Admitting: Ophthalmology

## 2021-12-06 ENCOUNTER — Inpatient Hospital Stay (HOSPITAL_BASED_OUTPATIENT_CLINIC_OR_DEPARTMENT_OTHER): Payer: Medicare Other | Admitting: Internal Medicine

## 2021-12-06 VITALS — BP 136/70 | HR 70 | Temp 97.8°F | Resp 19 | Wt 164.4 lb

## 2021-12-06 DIAGNOSIS — I10 Essential (primary) hypertension: Secondary | ICD-10-CM

## 2021-12-06 DIAGNOSIS — H34831 Tributary (branch) retinal vein occlusion, right eye, with macular edema: Secondary | ICD-10-CM

## 2021-12-06 DIAGNOSIS — H35371 Puckering of macula, right eye: Secondary | ICD-10-CM

## 2021-12-06 DIAGNOSIS — E113213 Type 2 diabetes mellitus with mild nonproliferative diabetic retinopathy with macular edema, bilateral: Secondary | ICD-10-CM | POA: Diagnosis not present

## 2021-12-06 DIAGNOSIS — Z961 Presence of intraocular lens: Secondary | ICD-10-CM

## 2021-12-06 DIAGNOSIS — N183 Chronic kidney disease, stage 3 unspecified: Secondary | ICD-10-CM | POA: Diagnosis present

## 2021-12-06 DIAGNOSIS — D649 Anemia, unspecified: Secondary | ICD-10-CM

## 2021-12-06 DIAGNOSIS — Z794 Long term (current) use of insulin: Secondary | ICD-10-CM | POA: Diagnosis not present

## 2021-12-06 DIAGNOSIS — H35033 Hypertensive retinopathy, bilateral: Secondary | ICD-10-CM | POA: Diagnosis not present

## 2021-12-06 DIAGNOSIS — E1122 Type 2 diabetes mellitus with diabetic chronic kidney disease: Secondary | ICD-10-CM | POA: Insufficient documentation

## 2021-12-06 DIAGNOSIS — I129 Hypertensive chronic kidney disease with stage 1 through stage 4 chronic kidney disease, or unspecified chronic kidney disease: Secondary | ICD-10-CM | POA: Insufficient documentation

## 2021-12-06 DIAGNOSIS — D509 Iron deficiency anemia, unspecified: Secondary | ICD-10-CM | POA: Insufficient documentation

## 2021-12-06 DIAGNOSIS — D631 Anemia in chronic kidney disease: Secondary | ICD-10-CM | POA: Diagnosis present

## 2021-12-06 DIAGNOSIS — Z79899 Other long term (current) drug therapy: Secondary | ICD-10-CM | POA: Insufficient documentation

## 2021-12-06 LAB — COMPREHENSIVE METABOLIC PANEL
ALT: 18 U/L (ref 0–44)
AST: 25 U/L (ref 15–41)
Albumin: 3.9 g/dL (ref 3.5–5.0)
Alkaline Phosphatase: 50 U/L (ref 38–126)
Anion gap: 10 (ref 5–15)
BUN: 30 mg/dL — ABNORMAL HIGH (ref 8–23)
CO2: 21 mmol/L — ABNORMAL LOW (ref 22–32)
Calcium: 9.5 mg/dL (ref 8.9–10.3)
Chloride: 108 mmol/L (ref 98–111)
Creatinine, Ser: 1.62 mg/dL — ABNORMAL HIGH (ref 0.44–1.00)
GFR, Estimated: 33 mL/min — ABNORMAL LOW (ref >60)
Glucose, Bld: 136 mg/dL — ABNORMAL HIGH (ref 70–99)
Potassium: 4.7 mmol/L (ref 3.5–5.1)
Sodium: 139 mmol/L (ref 135–145)
Total Bilirubin: 0.6 mg/dL (ref 0.3–1.2)
Total Protein: 6.9 g/dL (ref 6.5–8.1)

## 2021-12-06 LAB — IRON AND TIBC
Iron: 49 ug/dL (ref 28–170)
Saturation Ratios: 14 % (ref 10.4–31.8)
TIBC: 346 ug/dL (ref 250–450)
UIBC: 297 ug/dL

## 2021-12-06 LAB — CBC WITH DIFFERENTIAL/PLATELET
Abs Immature Granulocytes: 0.01 10*3/uL (ref 0.00–0.07)
Basophils Absolute: 0 10*3/uL (ref 0.0–0.1)
Basophils Relative: 1 %
Eosinophils Absolute: 0.2 10*3/uL (ref 0.0–0.5)
Eosinophils Relative: 3 %
HCT: 32.6 % — ABNORMAL LOW (ref 36.0–46.0)
Hemoglobin: 10.1 g/dL — ABNORMAL LOW (ref 12.0–15.0)
Immature Granulocytes: 0 %
Lymphocytes Relative: 17 %
Lymphs Abs: 0.8 10*3/uL (ref 0.7–4.0)
MCH: 29.1 pg (ref 26.0–34.0)
MCHC: 31 g/dL (ref 30.0–36.0)
MCV: 93.9 fL (ref 80.0–100.0)
Monocytes Absolute: 0.6 10*3/uL (ref 0.1–1.0)
Monocytes Relative: 12 %
Neutro Abs: 3.4 10*3/uL (ref 1.7–7.7)
Neutrophils Relative %: 67 %
Platelets: 271 10*3/uL (ref 150–400)
RBC: 3.47 MIL/uL — ABNORMAL LOW (ref 3.87–5.11)
RDW: 17.8 % — ABNORMAL HIGH (ref 11.5–15.5)
WBC: 5.1 10*3/uL (ref 4.0–10.5)
nRBC: 0 % (ref 0.0–0.2)

## 2021-12-06 LAB — FERRITIN: Ferritin: 66 ng/mL (ref 11–307)

## 2021-12-06 LAB — LACTATE DEHYDROGENASE: LDH: 117 U/L (ref 98–192)

## 2021-12-06 MED ORDER — FARICIMAB-SVOA 6 MG/0.05ML IZ SOLN
6.0000 mg | INTRAVITREAL | Status: AC | PRN
  Administered 2021-12-06: 6 mg via INTRAVITREAL

## 2021-12-06 NOTE — Assessment & Plan Note (Addendum)
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  Improved s/p IV iron infusions however today- Hb 10.2 Proceed with Venofer today; continue Retacrit every 2 weeks.  # Etiology-CKD-III; GFR ~35 overall -stable [Dr.Kolluru]; recommend continued hydration.  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   #Poor IV access/Mediport placement-STABLE.   Schedule retacrit/venofer on separate days  # DISPOSITION:  # Venofer tomorrow as planned;  NO retacrit today  # every 2 weeks- labs- H&H- possible retacrit   # follow up 8 weeks- MD; port; labs- cbc/bmp; iron studies;ferritin; possible retacrit OR venofer- Dr.B

## 2021-12-06 NOTE — Progress Notes (Signed)
Cheryl Leonard NOTE  Patient Care Team: Rusty Aus, MD as PCP - General (Internal Medicine) Josefine Class, MD as Referring Physician (Gastroenterology) Cammie Sickle, MD as Consulting Physician (Hematology and Oncology)  CHIEF COMPLAINTS/PURPOSE OF CONSULTATION: Anemia  HEMATOLOGY HISTORY  # ANEMIA- Jan 2021- 8.8/ferritin 11 [PCP]; N-WBC/platelets? IDA vs other- EGD-2015/colonoscopy-? 2015; 2020- [Dr.Skulskie] ; capsule-2016- ? 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   HISTORY OF PRESENTING ILLNESS: Ambulating independently.  Alone.  Cheryl Leonard 76 y.o.  female anemia iron deficiency-question CKD-III  here for follow-up.   Patient has no concerns today.  In the interim patient received IV iron infusions x7 the last 2 months.  Patient also received Retacrit every other week x 3.  Patient feels improved in energy levels in the last 1 to 2 weeks.  Review of Systems  Constitutional:  Positive for malaise/fatigue. Negative for chills, diaphoresis and fever.  HENT:  Negative for nosebleeds and sore throat.   Eyes:  Negative for double vision.  Respiratory:  Positive for shortness of breath. Negative for cough, hemoptysis, sputum production and wheezing.   Cardiovascular:  Negative for chest pain, palpitations, orthopnea and leg swelling.  Gastrointestinal:  Negative for abdominal pain, blood in stool, constipation, diarrhea, heartburn, melena, nausea and vomiting.  Genitourinary:  Negative for dysuria, frequency and urgency.  Musculoskeletal:  Positive for joint pain.  Skin: Negative.  Negative for itching and rash.  Neurological:  Negative for dizziness, tingling, focal weakness, weakness and headaches.  Endo/Heme/Allergies:  Does not bruise/bleed easily.   Psychiatric/Behavioral:  Negative for depression. The patient is not nervous/anxious and does not have insomnia.     MEDICAL HISTORY:  Past Medical History:  Diagnosis Date   Anemia    Anxiety    Arthritis    Gout   Cataracts, both eyes    Diabetic retinopathy ([place])    NPDR OU   Diverticulitis    GERD (gastroesophageal reflux disease)    Gout    Headache    h/o migraines   History of fracture of patella    right knee   History of positive PPD    Patient always shows positive   Hyperlipidemia    Hypertension    Hypertensive retinopathy    OU   Hypothyroidism    Lichen sclerosus 51/89/8421   of vulva   Metatarsal fracture    Neuropathy    Osteopenia    Peripheral vascular disease (HCC)    Polyneuropathy    numbness and tingling in feet and toes   Renal insufficiency    Stage 3   Sleep apnea    does not use cpap-lost weight    Type 2 diabetes mellitus, uncontrolled     SURGICAL HISTORY: Past Surgical History:  Procedure Laterality Date   ABDOMINAL HYSTERECTOMY     AMPUTATION TOE Right 05/08/2020   Procedure: AMPUTATION TOE-Right 4th Toe;  Surgeon: Caroline More, DPM;  Location: [facility];  Service: Podiatry;  Laterality: Right;   AMPUTATION TOE Left 04/22/2021   Procedure: AMPUTATION TOE - 4TH METARSOPHANGEAL JOINT;  Surgeon: Caroline More, DPM;  Location: [facility];  Service: Podiatry;  Laterality: Left;   APPENDECTOMY     BREAST REDUCTION SURGERY  2001   CATARACT EXTRACTION     CESAREAN SECTION  1976   COLONOSCOPY  03/05/2013   Nml - due for repeat 03/06/2018   COLONOSCOPY WITH PROPOFOL N/A 03/18/2019   Procedure: COLONOSCOPY WITH PROPOFOL;  Surgeon: Toledo, Benay Pike, MD;  Location: [facility];  Service: Gastroenterology;  Laterality: N/A;   DIAGNOSTIC LAPAROSCOPY     DILATION AND CURETTAGE OF UTERUS  1989   ENDARTERECTOMY FEMORAL Bilateral 03/09/2018   Procedure: ENDARTERECTOMY FEMORAL;  Surgeon: Algernon Huxley, MD;  Location: [facility];  Service: Vascular;   Laterality: Bilateral;   ENDARTERECTOMY POPLITEAL Left 03/09/2018   Procedure: ENDARTERECTOMY POPLITEAL AND SFA;  Surgeon: Algernon Huxley, MD;  Location: [facility];  Service: Vascular;  Laterality: Left;   ESOPHAGOGASTRODUODENOSCOPY  03/05/2013   ESOPHAGOGASTRODUODENOSCOPY (EGD) WITH PROPOFOL N/A 03/18/2019   Procedure: ESOPHAGOGASTRODUODENOSCOPY (EGD) WITH PROPOFOL;  Surgeon: Toledo, Benay Pike, MD;  Location: [facility];  Service: Gastroenterology;  Laterality: N/A;   EYE SURGERY     Eyelid Surgery  2012   INTRAMEDULLARY (IM) NAIL INTERTROCHANTERIC Left 10/30/2015   Procedure: INTRAMEDULLARY (IM) NAIL INTERTROCHANTRIC ;  Surgeon: Hessie Knows, MD;  Location: [facility];  Service: Orthopedics;  Laterality: Left;   KYPHOPLASTY N/A 10/25/2018   Procedure: L4 KYPHOPLASTY;  Surgeon: Hessie Knows, MD;  Location: [facility];  Service: Orthopedics;  Laterality: N/A;   LAPAROSCOPIC HYSTERECTOMY  2000   total   LOWER EXTREMITY ANGIOGRAPHY Left 03/08/2017   Procedure: LOWER EXTREMITY ANGIOGRAPHY;  Surgeon: Algernon Huxley, MD;  Location: [place] [facility];  Service: Cardiovascular;  Laterality: Left;   LOWER EXTREMITY ANGIOGRAPHY Left 10/30/2017   Procedure: LOWER EXTREMITY ANGIOGRAPHY;  Surgeon: Algernon Huxley, MD;  Location: [place] [facility];  Service: Cardiovascular;  Laterality: Left;   LOWER EXTREMITY ANGIOGRAPHY Right 03/08/2018   Procedure: LOWER EXTREMITY ANGIOGRAPHY;  Surgeon: Algernon Huxley, MD;  Location: [place] [facility];  Service: Cardiovascular;  Laterality: Right;   LOWER EXTREMITY ANGIOGRAPHY Left 10/01/2018   Procedure: LOWER EXTREMITY ANGIOGRAPHY;  Surgeon: Algernon Huxley, MD;  Location: [place] [facility];  Service: Cardiovascular;  Laterality: Left;   LOWER EXTREMITY ANGIOGRAPHY Right 10/08/2018   Procedure: LOWER EXTREMITY ANGIOGRAPHY;  Surgeon: Algernon Huxley, MD;  Location: [place] [facility];  Service: Cardiovascular;  Laterality: Right;   LOWER EXTREMITY ANGIOGRAPHY Right 05/07/2020    Procedure: Lower Extremity Angiography;  Surgeon: Algernon Huxley, MD;  Location: [place] [facility];  Service: Cardiovascular;  Laterality: Right;   LYSIS OF ADHESION  01/25/2021   Procedure: LYSIS OF ADHESION;  Surgeon: Ronny Bacon, MD;  Location: [facility];  Service: General;;   PERIPHERAL VASCULAR INTERVENTION  03/08/2018   Procedure: PERIPHERAL VASCULAR INTERVENTION;  Surgeon: Algernon Huxley, MD;  Location: [place] [facility];  Service: Cardiovascular;;   PORTA CATH INSERTION N/A 02/17/2020   Procedure: PORTA CATH INSERTION;  Surgeon: Algernon Huxley, MD;  Location: [place] [facility];  Service: Cardiovascular;  Laterality: N/A;   REDUCTION MAMMAPLASTY  1997   SACROPLASTY N/A 10/25/2018   Procedure: S1 SACROPLASTY;  Surgeon: Hessie Knows, MD;  Location: [facility];  Service: Orthopedics;  Laterality: N/A;    SOCIAL HISTORY: Social History   Socioeconomic History   Marital status: Married    Spouse name: [name]   Number of children: 3   Years of education: Not on file   Highest education level: Not on file  Occupational History   Occupation: Retail banker  Tobacco Use   Smoking status: Former    Packs/day: 1.00    Years: 20.00    Total pack  years: 20.00    Types: Cigarettes    Quit date: 03/07/1996    Years since quitting: 25.7    Passive exposure: Past   Smokeless tobacco: Never   Tobacco comments:    started smoking at age 31 but stopped smoking in 2000  Vaping Use   Vaping Use: Never used  Substance and Sexual Activity   Alcohol use: No    Alcohol/week: 0.0 standard drinks of alcohol   Drug use: No   Sexual activity: Yes    Partners: Male    Birth control/protection: Surgical  Other Topics Concern   Not on file  Social History Narrative   Lives in [place]; with husband; quit > 20 years; no alcohol; used to work at [employer]  at [employer].    Social Determinants of Health   Financial Resource Strain: Low Risk  (10/08/2018)   Overall Financial Resource  Strain (CARDIA)    Difficulty of Paying Living Expenses: Not very hard  Food Insecurity: No Food Insecurity (10/08/2018)   Hunger Vital Sign    Worried About Running Out of Food in the Last Year: Never true    Ran Out of Food in the Last Year: Never true  Transportation Needs: Unknown (10/01/2018)   PRAPARE - Hydrologist (Medical): No    Lack of Transportation (Non-Medical): Not on file  Physical Activity: Unknown (10/01/2018)   Exercise Vital Sign    Days of Exercise per Week: 2 days    Minutes of Exercise per Session: Not on file  Stress: Stress Concern Present (10/01/2018)   [place]    Feeling of Stress : To some extent  Social Connections: Unknown (10/08/2018)   Social Connection and Isolation Panel [NHANES]    Frequency of Communication with Friends and Family: More than three times a week    Frequency of Social Gatherings with Friends and Family: Not on file    Attends Religious Services: Not on file    Active Member of Clubs or Organizations: Not on file    Attends Archivist Meetings: Not on file    Marital Status: Married  Intimate Partner Violence: Not At Risk (10/08/2018)   Humiliation, Afraid, Rape, and Kick questionnaire    Fear of Current or Ex-Partner: No    Emotionally Abused: No    Physically Abused: No    Sexually Abused: No    FAMILY HISTORY: Family History  Problem Relation Age of Onset   Coronary artery disease Father    Heart attack Father    Coronary artery disease Mother    Heart attack Mother    Ovarian cancer Sister 27       sister had hormonal therapy for IVF txs-which increased risk factor for ovarian cancer   Breast cancer Neg Hx     ALLERGIES:  has No Known Allergies.  MEDICATIONS:  Current Outpatient Medications  Medication Sig Dispense Refill   ALPRAZolam (XANAX) 0.25 MG tablet Take 0.25 mg by mouth daily as needed for anxiety.      amLODipine (NORVASC) 5 MG tablet Take 5 mg by mouth 2 (two) times daily.     aspirin EC 81 MG tablet Take 1 tablet (81 mg total) by mouth daily. 30 tablet 11   cholecalciferol (VITAMIN D) 1000 units tablet Take 1,000 Units by mouth 2 (two) times daily.     CORAL CALCIUM PO Take 1 tablet by mouth daily.     denosumab (PROLIA)  60 MG/ML SOLN injection Inject 60 mg into the skin every 6 (six) months.      escitalopram (LEXAPRO) 10 MG tablet Take 10 mg by mouth daily.     estradiol (ESTRACE) 0.1 MG/GM vaginal cream Place 1 Applicatorful vaginally daily as needed (vaginal irritation).     famotidine (PEPCID) 40 MG tablet Take 40 mg by mouth daily.     furosemide (LASIX) 20 MG tablet Take 20 mg by mouth every Wednesday.     gabapentin (NEURONTIN) 300 MG capsule Take 300 mg by mouth at bedtime as needed (pain).     HYDROcodone-acetaminophen (NORCO/VICODIN) 5-325 MG tablet Take 1 tablet by mouth daily.     levothyroxine (SYNTHROID, LEVOTHROID) 100 MCG tablet Take 100 mcg by mouth daily before breakfast.   3   lidocaine-prilocaine (EMLA) cream Apply 1 application topically as needed (apply prior to port a cath access). 30 g 3   Magnesium 500 MG TABS Take 500 mg by mouth every morning.      metFORMIN (GLUCOPHAGE) 1000 MG tablet Take 1,000 mg by mouth 2 (two) times daily with a meal.     metoprolol succinate (TOPROL-XL) 50 MG 24 hr tablet Take 50 mg by mouth daily. Take with or immediately following a meal.     mirtazapine (REMERON) 15 MG tablet Take 15 mg by mouth as needed.     Multiple Vitamin (MULTIVITAMIN WITH MINERALS) TABS tablet Take 1 tablet by mouth daily. Centrum Silver     nystatin cream (MYCOSTATIN) Apply 1 application topically daily as needed (Yeast infection).     olmesartan (BENICAR) 20 MG tablet Take 20 mg by mouth daily.     pantoprazole (PROTONIX) 40 MG tablet Take 40 mg by mouth daily.     pantoprazole sodium (PROTONIX) 40 mg Take 40 mg by mouth every morning.      rivaroxaban  (XARELTO) 20 MG TABS tablet Take 1 tablet (20 mg total) by mouth daily with supper. 90 tablet 3   rosuvastatin (CRESTOR) 20 MG tablet Take 20 mg by mouth every morning.     TRESIBA FLEXTOUCH 200 UNIT/ML SOPN Inject 25-30 Units as directed at bedtime. Titrate according to fasting blood glucose not to exceed 50 units a day  5   vitamin B-12 (CYANOCOBALAMIN) 1000 MCG tablet Take 1,000 mcg by mouth daily.     vitamin C (ASCORBIC ACID) 250 MG tablet Take 250 mg by mouth daily.     vitamin E 400 UNIT capsule Take 400 Units by mouth daily.     zolpidem (AMBIEN) 10 MG tablet Take 10 mg by mouth at bedtime.     No current facility-administered medications for this visit.      PHYSICAL EXAMINATION:   Vitals:   12/06/21 1113  BP: 136/70  Pulse: 70  Resp: 19  Temp: 97.8 F (36.6 C)  SpO2: 99%   Filed Weights   12/06/21 1113  Weight: 164 lb 6.4 oz (74.6 kg)    Physical Exam Constitutional:      Comments: Alone.  Ambulating independently.  HENT:     Head: Normocephalic and atraumatic.     Mouth/Throat:     Pharynx: No oropharyngeal exudate.  Eyes:     Pupils: Pupils are equal, round, and reactive to light.  Cardiovascular:     Rate and Rhythm: Normal rate and regular rhythm.  Pulmonary:     Effort: Pulmonary effort is normal. No respiratory distress.     Breath sounds: Normal breath sounds. No wheezing.  Abdominal:  General: Bowel sounds are normal. There is no distension.     Palpations: Abdomen is soft. There is no mass.     Tenderness: There is no abdominal tenderness. There is no guarding or rebound.  Musculoskeletal:        General: No tenderness. Normal range of motion.     Cervical back: Normal range of motion and neck supple.  Skin:    General: Skin is warm.  Neurological:     Mental Status: She is alert and oriented to person, place, and time.  Psychiatric:        Mood and Affect: Affect normal.     LABORATORY DATA:  I have reviewed the data as listed Lab  Results  Component Value Date   WBC 5.1 12/06/2021   HGB 10.1 (L) 12/06/2021   HCT 32.6 (L) 12/06/2021   MCV 93.9 12/06/2021   PLT 271 12/06/2021   Recent Labs    01/11/21 1317 01/25/21 1349 07/12/21 0900 10/05/21 1426 11/16/21 1425  NA 134*   < > 134* 136 136  K 4.6   < > 4.0 4.5 4.8  CL 102   < > 102 108 105  CO2 24   < > 22 21* 23  GLUCOSE 279*   < > 216* 167* 122*  BUN 34*   < > 37* 43* 40*  CREATININE 1.33*   < > 1.37* 1.52* 1.64*  CALCIUM 9.7   < > 9.4 9.3 10.0  GFRNONAA 42*   < > 40* 35* 32*  PROT 7.1  --   --   --   --   ALBUMIN 3.7  --   --   --   --   AST 20  --   --   --   --   ALT 18  --   --   --   --   ALKPHOS 63  --   --   --   --   BILITOT 0.6  --   --   --   --    < > = values in this interval not displayed.     No results found.   Normocytic anemia # Anemia/likely secondary to CKD-III/iron deficiency.  Improved s/p IV iron infusions however today- Hb 8.2 Proceed with Venofer today.  # Discussed use of erythropoietin stimulating agents like retacrit to stimulate the bone marrow.  Discussed the potential issues with erythropoietin estimating agents-given the risk of stroke thromboembolic events/elevated blood pressure.  However, most of the serious events did not happen when the goal hematocrit is 33/hemoglobin 30.  Start Retacrit this week; every other week.  Recommend holding off bone marrow biopsy at this time.  Check LDH haptoglobin next visit.  # Etiology-CKD-III; GFR ~35 overall -stable [Dr.Kolluru]; recommend continued hydration.  # #Diabetes/complications [id number]]-ONGEXBM stable; Gangrene Right toes s/p amputation [May 2022]- on xarelto; s/p  left toe amputation/awaiting evaluation with vascular, Dr.Dew.   #Poor IV access/Mediport placement-STABLE.   # DISPOSITION: Schedule retacrit/venofer on separate days # Venofer today; NO retacrit  # every 2 weeks- labs- H&H- possible retacrit   # follow up 8 weeks- MD; port; labs- cbc/bmp;  LDH; Haptoglobin-;possible retacrit-venofer- Dr.B    All questions were answered. The patient knows to call the clinic with any problems, questions or concerns.    Cammie Sickle, MD 12/06/2021 11:30 AM

## 2021-12-06 NOTE — Progress Notes (Signed)
Patient has no concerns 

## 2021-12-07 ENCOUNTER — Inpatient Hospital Stay: Payer: Medicare Other

## 2021-12-07 VITALS — BP 119/53 | HR 67 | Temp 97.9°F | Resp 16

## 2021-12-07 DIAGNOSIS — D631 Anemia in chronic kidney disease: Secondary | ICD-10-CM

## 2021-12-07 DIAGNOSIS — N183 Chronic kidney disease, stage 3 unspecified: Secondary | ICD-10-CM | POA: Diagnosis not present

## 2021-12-07 MED ORDER — SODIUM CHLORIDE 0.9 % IV SOLN
200.0000 mg | Freq: Once | INTRAVENOUS | Status: AC
  Administered 2021-12-07: 200 mg via INTRAVENOUS
  Filled 2021-12-07: qty 200

## 2021-12-07 MED ORDER — SODIUM CHLORIDE 0.9 % IV SOLN
Freq: Once | INTRAVENOUS | Status: AC
  Filled 2021-12-07: qty 250

## 2021-12-17 ENCOUNTER — Encounter (INDEPENDENT_AMBULATORY_CARE_PROVIDER_SITE_OTHER): Payer: Medicare Other | Admitting: Ophthalmology

## 2021-12-17 MED FILL — Iron Sucrose Inj 20 MG/ML (Fe Equiv): INTRAVENOUS | Qty: 10 | Status: AC

## 2021-12-20 ENCOUNTER — Ambulatory Visit: Payer: PRIVATE HEALTH INSURANCE

## 2021-12-20 ENCOUNTER — Ambulatory Visit: Payer: PRIVATE HEALTH INSURANCE | Admitting: Nurse Practitioner

## 2021-12-20 ENCOUNTER — Ambulatory Visit: Payer: PRIVATE HEALTH INSURANCE | Admitting: Internal Medicine

## 2021-12-20 ENCOUNTER — Other Ambulatory Visit: Payer: PRIVATE HEALTH INSURANCE

## 2021-12-20 ENCOUNTER — Inpatient Hospital Stay: Payer: Medicare Other

## 2021-12-20 DIAGNOSIS — N183 Chronic kidney disease, stage 3 unspecified: Secondary | ICD-10-CM | POA: Diagnosis not present

## 2021-12-20 DIAGNOSIS — D649 Anemia, unspecified: Secondary | ICD-10-CM

## 2021-12-20 LAB — HEMOGLOBIN AND HEMATOCRIT, BLOOD
HCT: 31.6 % — ABNORMAL LOW (ref 36.0–46.0)
Hemoglobin: 10.3 g/dL — ABNORMAL LOW (ref 12.0–15.0)

## 2022-01-04 ENCOUNTER — Inpatient Hospital Stay: Payer: Medicare Other

## 2022-01-04 ENCOUNTER — Inpatient Hospital Stay: Payer: Medicare Other | Attending: Internal Medicine

## 2022-01-04 VITALS — BP 111/58 | HR 69

## 2022-01-04 DIAGNOSIS — D631 Anemia in chronic kidney disease: Secondary | ICD-10-CM | POA: Diagnosis present

## 2022-01-04 DIAGNOSIS — N183 Chronic kidney disease, stage 3 unspecified: Secondary | ICD-10-CM | POA: Insufficient documentation

## 2022-01-04 DIAGNOSIS — I129 Hypertensive chronic kidney disease with stage 1 through stage 4 chronic kidney disease, or unspecified chronic kidney disease: Secondary | ICD-10-CM | POA: Insufficient documentation

## 2022-01-04 DIAGNOSIS — D649 Anemia, unspecified: Secondary | ICD-10-CM

## 2022-01-04 DIAGNOSIS — Z79899 Other long term (current) drug therapy: Secondary | ICD-10-CM | POA: Insufficient documentation

## 2022-01-04 LAB — HEMOGLOBIN AND HEMATOCRIT, BLOOD
HCT: 28.8 % — ABNORMAL LOW (ref 36.0–46.0)
Hemoglobin: 9.8 g/dL — ABNORMAL LOW (ref 12.0–15.0)

## 2022-01-04 MED ORDER — EPOETIN ALFA-EPBX 10000 UNIT/ML IJ SOLN
20000.0000 [IU] | Freq: Once | INTRAMUSCULAR | Status: AC
  Administered 2022-01-04: 20000 [IU] via SUBCUTANEOUS
  Filled 2022-01-04: qty 2

## 2022-01-04 NOTE — Progress Notes (Signed)
[facility] Note  01/05/2022     CHIEF COMPLAINT Patient presents for Retina Follow Up   HISTORY OF PRESENT ILLNESS: Cheryl Leonard is a 77 y.o. female who presents to the clinic today for:    HPI     Retina Follow Up   This started 4 weeks ago.  Duration of 4 weeks.  I, the attending physician,  performed the HPI with the patient and updated documentation appropriately.        Comments   4 week retina follow up BRVO OD and IVV OD pt is reporting no vision changes noticed       Last edited by Bernarda Caffey, MD on 01/05/2022  4:09 PM.     Pt states no change in vision, no problems with first shot of Vabysmo  Referring physician: Rusty Aus, MD [place],  [place] [address]   HISTORICAL INFORMATION:   Selected notes from the MEDICAL RECORD NUMBER Referred by Dr. Marvel Plan for concern of DME OD Lab Results  Component Value Date   HGBA1C 7.3 (H) 01/11/2021     CURRENT MEDICATIONS: No current outpatient medications on file. (Ophthalmic Drugs)   No current facility-administered medications for this visit. (Ophthalmic Drugs)   Current Outpatient Medications (Other)  Medication Sig   ALPRAZolam (XANAX) 0.25 MG tablet Take 0.25 mg by mouth daily as needed for anxiety.   amLODipine (NORVASC) 5 MG tablet Take 5 mg by mouth 2 (two) times daily.   aspirin EC 81 MG tablet Take 1 tablet (81 mg total) by mouth daily.   cholecalciferol (VITAMIN D) 1000 units tablet Take 1,000 Units by mouth 2 (two) times daily.   CORAL CALCIUM PO Take 1 tablet by mouth daily.   denosumab (PROLIA) 60 MG/ML SOLN injection Inject 60 mg into the skin every 6 (six) months.    escitalopram (LEXAPRO) 10 MG tablet Take 10 mg by mouth daily.   estradiol (ESTRACE) 0.1 MG/GM vaginal cream Place 1 Applicatorful vaginally daily as needed (vaginal irritation).   famotidine (PEPCID) 40 MG tablet Take 40 mg by mouth  daily.   furosemide (LASIX) 20 MG tablet Take 20 mg by mouth every Wednesday.   gabapentin (NEURONTIN) 300 MG capsule Take 300 mg by mouth at bedtime as needed (pain).   HYDROcodone-acetaminophen (NORCO/VICODIN) 5-325 MG tablet Take 1 tablet by mouth daily.   levothyroxine (SYNTHROID, LEVOTHROID) 100 MCG tablet Take 100 mcg by mouth daily before breakfast.    lidocaine-prilocaine (EMLA) cream Apply 1 application topically as needed (apply prior to port a cath access).   Magnesium 500 MG TABS Take 500 mg by mouth every morning.    metFORMIN (GLUCOPHAGE) 1000 MG tablet Take 1,000 mg by mouth 2 (two) times daily with a meal.   metoprolol succinate (TOPROL-XL) 50 MG 24 hr tablet Take 50 mg by mouth daily. Take with or immediately following a meal.   mirtazapine (REMERON) 15 MG tablet Take 15 mg by mouth as needed.   Multiple Vitamin (MULTIVITAMIN WITH MINERALS) TABS tablet Take 1 tablet by mouth daily. Centrum Silver   nystatin cream (MYCOSTATIN) Apply 1 application topically daily as needed (Yeast infection).   olmesartan (BENICAR) 20 MG tablet Take 20 mg by mouth daily.   pantoprazole (PROTONIX) 40 MG tablet Take 40 mg by mouth daily.   pantoprazole sodium (PROTONIX) 40 mg Take 40 mg by mouth every morning.    rivaroxaban (XARELTO) 20 MG TABS tablet  Take 1 tablet (20 mg total) by mouth daily with supper.   rosuvastatin (CRESTOR) 20 MG tablet Take 20 mg by mouth every morning.   TRESIBA FLEXTOUCH 200 UNIT/ML SOPN Inject 25-30 Units as directed at bedtime. Titrate according to fasting blood glucose not to exceed 50 units a day   vitamin B-12 (CYANOCOBALAMIN) 1000 MCG tablet Take 1,000 mcg by mouth daily.   vitamin C (ASCORBIC ACID) 250 MG tablet Take 250 mg by mouth daily.   vitamin E 400 UNIT capsule Take 400 Units by mouth daily.   zolpidem (AMBIEN) 10 MG tablet Take 10 mg by mouth at bedtime.   No current facility-administered medications for this visit. (Other)   REVIEW OF SYSTEMS: ROS    Positive for: Eyes Last edited by Bernarda Caffey, MD on 01/05/2022  4:09 PM.     ALLERGIES No Known Allergies  PAST MEDICAL HISTORY Past Medical History:  Diagnosis Date   Anemia    Anxiety    Arthritis    Gout   Cataracts, both eyes    Diabetic retinopathy ([employer])    NPDR OU   Diverticulitis    GERD (gastroesophageal reflux disease)    Gout    Headache    h/o migraines   History of fracture of patella    right knee   History of positive PPD    Patient always shows positive   Hyperlipidemia    Hypertension    Hypertensive retinopathy    OU   Hypothyroidism    Lichen sclerosus 16/10/9602   of vulva   Metatarsal fracture    Neuropathy    Osteopenia    Peripheral vascular disease (HCC)    Polyneuropathy    numbness and tingling in feet and toes   Renal insufficiency    Stage 3   Sleep apnea    does not use cpap-lost weight    Type 2 diabetes mellitus, uncontrolled    Past Surgical History:  Procedure Laterality Date   ABDOMINAL HYSTERECTOMY     AMPUTATION TOE Right 05/08/2020   Procedure: AMPUTATION TOE-Right 4th Toe;  Surgeon: Caroline More, DPM;  Location: [facility];  Service: Podiatry;  Laterality: Right;   AMPUTATION TOE Left 04/22/2021   Procedure: AMPUTATION TOE - 4TH METARSOPHANGEAL JOINT;  Surgeon: Caroline More, DPM;  Location: [facility];  Service: Podiatry;  Laterality: Left;   APPENDECTOMY     BREAST REDUCTION SURGERY  2001   CATARACT EXTRACTION     CESAREAN SECTION  1976   COLONOSCOPY  03/05/2013   Nml - due for repeat 03/06/2018   COLONOSCOPY WITH PROPOFOL N/A 03/18/2019   Procedure: COLONOSCOPY WITH PROPOFOL;  Surgeon: Toledo, Benay Pike, MD;  Location: [facility];  Service: Gastroenterology;  Laterality: N/A;   DIAGNOSTIC LAPAROSCOPY     DILATION AND CURETTAGE OF UTERUS  1989   ENDARTERECTOMY FEMORAL Bilateral 03/09/2018   Procedure: ENDARTERECTOMY FEMORAL;  Surgeon: Algernon Huxley, MD;  Location: [facility];  Service: Vascular;  Laterality: Bilateral;    ENDARTERECTOMY POPLITEAL Left 03/09/2018   Procedure: ENDARTERECTOMY POPLITEAL AND SFA;  Surgeon: Algernon Huxley, MD;  Location: [facility];  Service: Vascular;  Laterality: Left;   ESOPHAGOGASTRODUODENOSCOPY  03/05/2013   ESOPHAGOGASTRODUODENOSCOPY (EGD) WITH PROPOFOL N/A 03/18/2019   Procedure: ESOPHAGOGASTRODUODENOSCOPY (EGD) WITH PROPOFOL;  Surgeon: Toledo, Benay Pike, MD;  Location: [facility];  Service: Gastroenterology;  Laterality: N/A;   EYE SURGERY     Eyelid Surgery  2012   INTRAMEDULLARY (IM) NAIL INTERTROCHANTERIC Left 10/30/2015   Procedure:  INTRAMEDULLARY (IM) NAIL INTERTROCHANTRIC ;  Surgeon: Kennedy Bucker, MD;  Location: [facility];  Service: Orthopedics;  Laterality: Left;   KYPHOPLASTY N/A 10/25/2018   Procedure: L4 KYPHOPLASTY;  Surgeon: Kennedy Bucker, MD;  Location: [facility];  Service: Orthopedics;  Laterality: N/A;   LAPAROSCOPIC HYSTERECTOMY  2000   total   LOWER EXTREMITY ANGIOGRAPHY Left 03/08/2017   Procedure: LOWER EXTREMITY ANGIOGRAPHY;  Surgeon: Annice Needy, MD;  Location: [facility];  Service: Cardiovascular;  Laterality: Left;   LOWER EXTREMITY ANGIOGRAPHY Left 10/30/2017   Procedure: LOWER EXTREMITY ANGIOGRAPHY;  Surgeon: Annice Needy, MD;  Location: [facility];  Service: Cardiovascular;  Laterality: Left;   LOWER EXTREMITY ANGIOGRAPHY Right 03/08/2018   Procedure: LOWER EXTREMITY ANGIOGRAPHY;  Surgeon: Annice Needy, MD;  Location: [facility];  Service: Cardiovascular;  Laterality: Right;   LOWER EXTREMITY ANGIOGRAPHY Left 10/01/2018   Procedure: LOWER EXTREMITY ANGIOGRAPHY;  Surgeon: Annice Needy, MD;  Location: [facility];  Service: Cardiovascular;  Laterality: Left;   LOWER EXTREMITY ANGIOGRAPHY Right 10/08/2018   Procedure: LOWER EXTREMITY ANGIOGRAPHY;  Surgeon: Annice Needy, MD;  Location: [facility];  Service: Cardiovascular;  Laterality: Right;   LOWER EXTREMITY ANGIOGRAPHY Right 05/07/2020   Procedure: Lower Extremity  Angiography;  Surgeon: Annice Needy, MD;  Location: [facility];  Service: Cardiovascular;  Laterality: Right;   LYSIS OF ADHESION  01/25/2021   Procedure: LYSIS OF ADHESION;  Surgeon: Campbell Lerner, MD;  Location: [facility];  Service: General;;   PERIPHERAL VASCULAR INTERVENTION  03/08/2018   Procedure: PERIPHERAL VASCULAR INTERVENTION;  Surgeon: Annice Needy, MD;  Location: [facility];  Service: Cardiovascular;;   PORTA CATH INSERTION N/A 02/17/2020   Procedure: PORTA CATH INSERTION;  Surgeon: Annice Needy, MD;  Location: [facility];  Service: Cardiovascular;  Laterality: N/A;   REDUCTION MAMMAPLASTY  1997   SACROPLASTY N/A 10/25/2018   Procedure: S1 SACROPLASTY;  Surgeon: Kennedy Bucker, MD;  Location: [facility];  Service: Orthopedics;  Laterality: N/A;   FAMILY HISTORY Family History  Problem Relation Age of Onset   Coronary artery disease Father    Heart attack Father    Coronary artery disease Mother    Heart attack Mother    Ovarian cancer Sister 56       sister had hormonal therapy for IVF txs-which increased risk factor for ovarian cancer   Breast cancer Neg Hx    SOCIAL HISTORY Social History   Tobacco Use   Smoking status: Former    Packs/day: 1.00    Years: 20.00    Total pack years: 20.00    Types: Cigarettes    Quit date: 03/07/1996    Years since quitting: 25.8    Passive exposure: Past   Smokeless tobacco: Never   Tobacco comments:    started smoking at age 46 but stopped smoking in 2000  Vaping Use   Vaping Use: Never used  Substance Use Topics   Alcohol use: No    Alcohol/week: 0.0 standard drinks of alcohol   Drug use: No       OPHTHALMIC EXAM: Base Eye Exam     Visual Acuity (Snellen - Linear)       Right Left   Dist [place] 20/30 20/25         Tonometry (Tonopen, 2:39 PM)       Right Left   Pressure 12 10  Pupils       Pupils Dark Light Shape React APD   Right PERRL 3 2 Round Brisk None   Left PERRL 3 2  Round Brisk None         Visual Fields       Left Right    Full Full         Extraocular Movement       Right Left    Full, Ortho Full, Ortho         Neuro/Psych     Oriented x3: Yes   Mood/Affect: Normal         Dilation     Both eyes: 2.5% Phenylephrine @ 2:39 PM           Slit Lamp and Fundus Exam     External Exam       Right Left   External Normal Normal         Slit Lamp Exam       Right Left   Lids/Lashes dermatochalasis dermatochalasis   Conjunctiva/Sclera White and quiet White and quiet   Cornea arcus; well healed cataract wound; 2-3+ diffuse Punctate epithelial erosions, decreased TBUT, mild Anterior basement membrane dystrophy superiorly arcus; well healed cataract wound, 2-3+ diffuse Punctate epithelial erosions, irregualr epi surface, decreased TBUT   Anterior Chamber Deep and quiet Deep and quiet   Iris Round and dilated Round and dilated   Lens PCIOL; open PC PCIOL; open PC   Anterior Vitreous syneresis, Posterior vitreous detachment, vitreous condensations inferiorly syneresis, Posterior vitreous detachment         Fundus Exam       Right Left   Disc Superior hyperemia, mild Pallor Pink and Sharp   C/D Ratio 0.6 0.5   Macula Flat, Blunted foveal reflex, +cystic changes - improved, +Epiretinal membrane, minimal MA, +PED flat; good foveal reflex, no heme or edema, small pigment clump IT to fovea   Vessels attenuated, Tortuous attenuated, Tortuous   Periphery Attached; scattered DBH -- greastest temporal periphery Attached, no heme           IMAGING AND PROCEDURES  Imaging and Procedures for 04/25/17  OCT, Retina - OU - Both Eyes       Right Eye Quality was good. Central Foveal Thickness: 345. Progression has improved. Findings include no SRF, abnormal foveal contour, epiretinal membrane, intraretinal fluid (Interval improvement in IRF and foveal contour, just trace cystic changes remain).   Left Eye Quality was good.  Central Foveal Thickness: 292. Progression has been stable. Findings include normal foveal contour, no IRF, no SRF (Trace ERM).   Notes *Images captured and stored on drive  Diagnosis / Impression:  OD: BRVO w/ CME - Interval improvement in IRF and foveal contour, just trace cystic changes remain OS: NFP; no IRF/SRF--stable, trace ERM  Clinical management:  See below  Abbreviations: NFP - Normal foveal profile. CME - cystoid macular edema. PED - pigment epithelial detachment. IRF - intraretinal fluid. SRF - subretinal fluid. EZ - ellipsoid zone. ERM - epiretinal membrane. ORA - outer retinal atrophy. ORT - outer retinal tubulation. SRHM - subretinal hyper-reflective material      Intravitreal Injection, Pharmacologic Agent - OD - Right Eye       Time Out 01/05/2022. 3:17 PM. Confirmed correct patient, procedure, site, and patient consented.   Anesthesia Topical anesthesia was used. Anesthetic medications included Lidocaine 2%, Proparacaine 0.5%.   Procedure Preparation included 5% betadine to ocular surface, eyelid speculum. A (32 g) needle was  used.   Injection: 6 mg faricimab-svoa 6 MG/0.05ML   Route: Intravitreal, Site: Right Eye   NDC: [id number], Lot: [id number], Expiration date: 01/03/2024, Waste: 0 mL   Post-op Post injection exam found visual acuity of at least counting fingers. The patient tolerated the procedure well. There were no complications. The patient received written and verbal post procedure care education. Post injection medications were not given.            ASSESSMENT/PLAN:    ICD-10-CM   1. Branch retinal vein occlusion of right eye with macular edema  H34.8310 OCT, Retina - OU - Both Eyes    Intravitreal Injection, Pharmacologic Agent - OD - Right Eye    faricimab-svoa (VABYSMO) 6mg /0.68mL intravitreal injection    2. Both eyes affected by mild nonproliferative diabetic retinopathy with macular edema, associated with type 2 diabetes mellitus  (HCC)  0m     3. Essential hypertension  I10     4. Hypertensive retinopathy of both eyes  H35.033     5. Epiretinal membrane (ERM) of right eye  H35.371     6. Pseudophakia of both eyes  Z96.1      1. BRVO w/ CME OD  - by history, pt states symptoms first noticed 2 wks prior to presentation, but reports changes may have occurred prior  - initial exam with differential tortuosity of vessels (OD > OS)  - FA (02.10.20) shows mild late staining / leakage in macula, staining / leakage of disc -- improving CME  - differential includes DM2 (DME), hypertensive retinopathy, inflammatory etiology / uveitis  - S/P IVA OD #1 (02.08.19), #2 (03.11.19), #3 (04.09.19), #4 (05.20.19), #5 (02.10.20)  - gave IVA OD on 2.10.20 due to pending Eylea4U for 2020 -- resulted in increased IRF/CME  - review of OCTs show persistent IRF and cystic changes --  resistance to IVA   - June 2019 -- switched therapies: S/P IVE OD #1 (06.24.19), #2 (07.24.19), #3 (09.04.19), #4 (10.30.19),#5 (12.30.19), #6 (03.23.20), #7 (05.05.20), #8 (07.16.20), #9 (07.17.20), #10 (08.28.20), #11 (10.13.20), # 12 (11.17.20), #13 (2.8.21), #14 (03.09.21), #15 (04.13.21), #16 (05.11.21), #17 (06.17.21), #18 (07.23.21), #19 (08.30.21), #20 (10.04.21), #21 (11.08.21), #22 (12.08.21), #23 (01.31.22), #24 (02.28.22), #25 (04.01.22), #26 (06.15.22), #27 (07.13.22), #28 (08.17.22), #29 (09.21.22), #30 (10.19.22), #31 (11.16.22), #32 (12.16.22), #33 (01.13.23), #34 (02.10.23), #35 (03.15.23), #36 (04.14.23), #37 (05.15.23), #38 (06.12.23), #39 (07.10.23), #40 (08.07.23), #41 (09.06.23), #42 (10.04.23), #43 (11.01.23) -- IVE resistance  - s/p IVV OD #1 (12.04.23)  - OCT today shows Interval improvement in IRF and foveal contour, just trace cystic changes remain at 4 wks  - BCVA 20/30 -- stable  - recommend IVV OD #2 today, 01.03.23 w/ f/u in 4 weeks  - RBA of procedure discussed, questions answered  - informed consent obtained  - see  procedure note   - Vabysmo informed consent form obtained, signed and scanned on 12.04.23  - Eylea informed consent form obtained, re-signed and scanned on 05.15.23  - Eylea4U benefits investigation completed and pt approved for IVE for 2023  - f/u 4 weeks  -- DFE/OCT/possible injection  2. Mild nonproliferative diabetic retinopathy, both eyes  - A1c 6.2 on 09.12.23; 6.6 on 07.01.23; 6.0 on 10.14.22  - could be contributing to CME OD  - OS with minimal diabetic retinopathy  - continue to monitor  3,4. Hypertensive retinopathy OU - stable  - as above, may have contributing to CME OD  - discussed importance of tight BP control  - monitor  5. Epiretinal membrane, right eye   - stable nasal ERM  - no indication for surgery at this time  6. Pseudophakia OU  - [facility]  - doing well  - monitor  Ophthalmic Meds Ordered this visit:  Meds ordered this encounter  Medications   faricimab-svoa (VABYSMO) 6mg /0.5305mL intravitreal injection     Return in about 4 weeks (around 02/02/2022) for f/u BRVO OD, DFE, OCT.  This document serves as a record of services personally performed by Karie ChimeraBrian G. Jamale Spangler, MD, PhD. It was created on their behalf by Glee ArvinAmanda J. Manson PasseyBrown, OA an ophthalmic technician. The creation of this record is the provider's dictation and/or activities during the visit.    Electronically signed by: Glee ArvinAmanda J. Manson PasseyBrown, [place] 01.02.2023 4:13 PM  Karie ChimeraBrian G. Zanyiah Posten, M.D., Ph.D. Diseases & Surgery of the Retina and Vitreous [facility]  I have reviewed the above documentation for accuracy and completeness, and I agree with the above. Karie ChimeraBrian G. Khala Tarte, M.D., Ph.D. 01/05/22 4:13 PM  Abbreviations: M myopia (nearsighted); A astigmatism; H hyperopia (farsighted); P presbyopia; Mrx spectacle prescription;  CTL contact lenses; OD right eye; OS left eye; OU both eyes  XT exotropia; ET esotropia; PEK punctate epithelial keratitis; PEE punctate  epithelial erosions; DES dry eye syndrome; MGD meibomian gland dysfunction; ATs artificial tears; PFAT's preservative free artificial tears; NSC nuclear sclerotic cataract; PSC posterior subcapsular cataract; ERM epi-retinal membrane; PVD posterior vitreous detachment; RD retinal detachment; DM diabetes mellitus; DR diabetic retinopathy; NPDR non-proliferative diabetic retinopathy; PDR proliferative diabetic retinopathy; CSME clinically significant macular edema; DME diabetic macular edema; dbh dot blot hemorrhages; CWS cotton wool spot; POAG primary open angle glaucoma; C/D cup-to-disc ratio; HVF humphrey visual field; GVF goldmann visual field; OCT optical coherence tomography; IOP intraocular pressure; BRVO Branch retinal vein occlusion; CRVO central retinal vein occlusion; CRAO central retinal artery occlusion; BRAO branch retinal artery occlusion; RT retinal tear; SB scleral buckle; PPV pars plana vitrectomy; VH Vitreous hemorrhage; PRP panretinal laser photocoagulation; IVK intravitreal kenalog; VMT vitreomacular traction; MH Macular hole;  NVD neovascularization of the disc; NVE neovascularization elsewhere; AREDS age related eye disease study; ARMD age related macular degeneration; POAG primary open angle glaucoma; EBMD epithelial/anterior basement membrane dystrophy; ACIOL anterior chamber intraocular lens; IOL intraocular lens; PCIOL posterior chamber intraocular lens; Phaco/IOL phacoemulsification with intraocular lens placement; PRK photorefractive keratectomy; LASIK laser assisted in situ keratomileusis; HTN hypertension; DM diabetes mellitus; COPD chronic obstructive pulmonary disease

## 2022-01-05 ENCOUNTER — Ambulatory Visit (INDEPENDENT_AMBULATORY_CARE_PROVIDER_SITE_OTHER): Payer: Medicare Other | Admitting: Ophthalmology

## 2022-01-05 ENCOUNTER — Encounter (INDEPENDENT_AMBULATORY_CARE_PROVIDER_SITE_OTHER): Payer: Self-pay | Admitting: Ophthalmology

## 2022-01-05 DIAGNOSIS — E113213 Type 2 diabetes mellitus with mild nonproliferative diabetic retinopathy with macular edema, bilateral: Secondary | ICD-10-CM | POA: Diagnosis not present

## 2022-01-05 DIAGNOSIS — H35033 Hypertensive retinopathy, bilateral: Secondary | ICD-10-CM

## 2022-01-05 DIAGNOSIS — Z961 Presence of intraocular lens: Secondary | ICD-10-CM

## 2022-01-05 DIAGNOSIS — H34831 Tributary (branch) retinal vein occlusion, right eye, with macular edema: Secondary | ICD-10-CM | POA: Diagnosis not present

## 2022-01-05 DIAGNOSIS — H35371 Puckering of macula, right eye: Secondary | ICD-10-CM

## 2022-01-05 DIAGNOSIS — I10 Essential (primary) hypertension: Secondary | ICD-10-CM

## 2022-01-05 MED ORDER — FARICIMAB-SVOA 6 MG/0.05ML IZ SOLN
6.0000 mg | INTRAVITREAL | Status: AC | PRN
  Administered 2022-01-05: 6 mg via INTRAVITREAL

## 2022-01-18 ENCOUNTER — Encounter (INDEPENDENT_AMBULATORY_CARE_PROVIDER_SITE_OTHER): Payer: Self-pay | Admitting: Vascular Surgery

## 2022-01-18 ENCOUNTER — Ambulatory Visit (INDEPENDENT_AMBULATORY_CARE_PROVIDER_SITE_OTHER): Payer: Medicare Other | Admitting: Vascular Surgery

## 2022-01-18 ENCOUNTER — Ambulatory Visit (INDEPENDENT_AMBULATORY_CARE_PROVIDER_SITE_OTHER): Payer: Medicare Other

## 2022-01-18 ENCOUNTER — Inpatient Hospital Stay: Payer: Medicare Other

## 2022-01-18 ENCOUNTER — Telehealth: Payer: Self-pay | Admitting: *Deleted

## 2022-01-18 VITALS — BP 112/62 | HR 66 | Ht 65.0 in | Wt 164.0 lb

## 2022-01-18 VITALS — BP 109/63 | HR 77 | Temp 98.5°F

## 2022-01-18 DIAGNOSIS — E1159 Type 2 diabetes mellitus with other circulatory complications: Secondary | ICD-10-CM

## 2022-01-18 DIAGNOSIS — I70219 Atherosclerosis of native arteries of extremities with intermittent claudication, unspecified extremity: Secondary | ICD-10-CM

## 2022-01-18 DIAGNOSIS — N183 Chronic kidney disease, stage 3 unspecified: Secondary | ICD-10-CM | POA: Diagnosis not present

## 2022-01-18 DIAGNOSIS — I10 Essential (primary) hypertension: Secondary | ICD-10-CM | POA: Diagnosis not present

## 2022-01-18 DIAGNOSIS — I6523 Occlusion and stenosis of bilateral carotid arteries: Secondary | ICD-10-CM

## 2022-01-18 DIAGNOSIS — E782 Mixed hyperlipidemia: Secondary | ICD-10-CM

## 2022-01-18 DIAGNOSIS — I129 Hypertensive chronic kidney disease with stage 1 through stage 4 chronic kidney disease, or unspecified chronic kidney disease: Secondary | ICD-10-CM | POA: Diagnosis not present

## 2022-01-18 DIAGNOSIS — N1832 Chronic kidney disease, stage 3b: Secondary | ICD-10-CM

## 2022-01-18 DIAGNOSIS — D649 Anemia, unspecified: Secondary | ICD-10-CM

## 2022-01-18 DIAGNOSIS — D631 Anemia in chronic kidney disease: Secondary | ICD-10-CM

## 2022-01-18 LAB — FERRITIN: Ferritin: 23 ng/mL (ref 11–307)

## 2022-01-18 LAB — CBC WITH DIFFERENTIAL/PLATELET
Abs Immature Granulocytes: 0.01 10*3/uL (ref 0.00–0.07)
Basophils Absolute: 0 10*3/uL (ref 0.0–0.1)
Basophils Relative: 1 %
Eosinophils Absolute: 0.2 10*3/uL (ref 0.0–0.5)
Eosinophils Relative: 3 %
HCT: 30.4 % — ABNORMAL LOW (ref 36.0–46.0)
Hemoglobin: 9.8 g/dL — ABNORMAL LOW (ref 12.0–15.0)
Immature Granulocytes: 0 %
Lymphocytes Relative: 22 %
Lymphs Abs: 1.2 10*3/uL (ref 0.7–4.0)
MCH: 30.3 pg (ref 26.0–34.0)
MCHC: 32.2 g/dL (ref 30.0–36.0)
MCV: 94.1 fL (ref 80.0–100.0)
Monocytes Absolute: 0.7 10*3/uL (ref 0.1–1.0)
Monocytes Relative: 12 %
Neutro Abs: 3.4 10*3/uL (ref 1.7–7.7)
Neutrophils Relative %: 62 %
Platelets: 261 10*3/uL (ref 150–400)
RBC: 3.23 MIL/uL — ABNORMAL LOW (ref 3.87–5.11)
RDW: 16.3 % — ABNORMAL HIGH (ref 11.5–15.5)
WBC: 5.4 10*3/uL (ref 4.0–10.5)
nRBC: 0 % (ref 0.0–0.2)

## 2022-01-18 LAB — BASIC METABOLIC PANEL
Anion gap: 9 (ref 5–15)
BUN: 42 mg/dL — ABNORMAL HIGH (ref 8–23)
CO2: 20 mmol/L — ABNORMAL LOW (ref 22–32)
Calcium: 9.4 mg/dL (ref 8.9–10.3)
Chloride: 106 mmol/L (ref 98–111)
Creatinine, Ser: 2.37 mg/dL — ABNORMAL HIGH (ref 0.44–1.00)
GFR, Estimated: 21 mL/min — ABNORMAL LOW (ref >60)
Glucose, Bld: 216 mg/dL — ABNORMAL HIGH (ref 70–99)
Potassium: 4.9 mmol/L (ref 3.5–5.1)
Sodium: 135 mmol/L (ref 135–145)

## 2022-01-18 LAB — IRON AND TIBC
Iron: 75 ug/dL (ref 28–170)
Saturation Ratios: 20 % (ref 10.4–31.8)
TIBC: 372 ug/dL (ref 250–450)
UIBC: 297 ug/dL

## 2022-01-18 LAB — VAS US ABI WITH/WO TBI
Left ABI: 1.01
Right ABI: 1.01

## 2022-01-18 MED ORDER — EPOETIN ALFA-EPBX 10000 UNIT/ML IJ SOLN
20000.0000 [IU] | Freq: Once | INTRAMUSCULAR | Status: AC
  Administered 2022-01-18: 20000 [IU] via SUBCUTANEOUS
  Filled 2022-01-18: qty 2

## 2022-01-18 NOTE — Telephone Encounter (Signed)
Call placed to patient to review lab results from today's visit. Patients creatinine 2.37, BUN 42, GFR 21. Per Dr. Rogue Bussing patient to follow up with nephrology regarding worsening renal function. Patient verbalized understanding and is agreement with plan to follow up with nephrology.

## 2022-01-18 NOTE — Assessment & Plan Note (Signed)
lipid control important in reducing the progression of atherosclerotic disease. Continue statin therapy  

## 2022-01-18 NOTE — Assessment & Plan Note (Signed)
Avoid contrast unless absolutely necessary. 

## 2022-01-18 NOTE — Assessment & Plan Note (Signed)
blood pressure control important in reducing the progression of atherosclerotic disease. On appropriate oral medications.  

## 2022-01-18 NOTE — Assessment & Plan Note (Signed)
Her ABIs today are 1.01 bilaterally with brisk triphasic waveforms and normal digital pressures.  Doing well after multiple previous interventions and surgery.  Recheck in 6 months with noninvasive studies.  No change in medical regimen.

## 2022-01-18 NOTE — Progress Notes (Signed)
MRN : [id number]  Cheryl Leonard is a 77 y.o. (10-07-1945) female who presents with chief complaint of  Chief Complaint  Patient presents with   Follow-up    6 month follow up ABI and Carotid  .  History of Present Illness: Patient returns today in follow up of multiple vascular issues.  Her most prominent vascular issue has been of peripheral arterial disease.  We performed multiple treatments in the past both interventions and surgery.  Her legs are doing quite well.  She remains on aspirin and Xarelto as well as a statin agent.  Her ABIs today are 1.01 bilaterally with brisk triphasic waveforms and normal digital pressures.  Current Outpatient Medications  Medication Sig Dispense Refill   ALPRAZolam (XANAX) 0.25 MG tablet Take 0.25 mg by mouth daily as needed for anxiety.     amLODipine (NORVASC) 5 MG tablet Take 5 mg by mouth 2 (two) times daily.     aspirin EC 81 MG tablet Take 1 tablet (81 mg total) by mouth daily. 30 tablet 11   cholecalciferol (VITAMIN D) 1000 units tablet Take 1,000 Units by mouth 2 (two) times daily.     denosumab (PROLIA) 60 MG/ML SOLN injection Inject 60 mg into the skin every 6 (six) months.      escitalopram (LEXAPRO) 10 MG tablet Take 10 mg by mouth daily.     estradiol (ESTRACE) 0.1 MG/GM vaginal cream Place 1 Applicatorful vaginally daily as needed (vaginal irritation).     famotidine (PEPCID) 40 MG tablet Take 40 mg by mouth daily.     furosemide (LASIX) 20 MG tablet Take 20 mg by mouth every Wednesday.     gabapentin (NEURONTIN) 300 MG capsule Take 300 mg by mouth at bedtime as needed (pain).     HYDROcodone-acetaminophen (NORCO/VICODIN) 5-325 MG tablet Take 1 tablet by mouth daily.     levothyroxine (SYNTHROID, LEVOTHROID) 100 MCG tablet Take 100 mcg by mouth daily before breakfast.   3   lidocaine-prilocaine (EMLA) cream Apply 1 application topically as needed (apply prior to port a cath access). 30 g 3   Magnesium 500 MG TABS Take 500 mg by  mouth every morning.      metoprolol succinate (TOPROL-XL) 50 MG 24 hr tablet Take 50 mg by mouth daily. Take with or immediately following a meal.     mirtazapine (REMERON) 15 MG tablet Take 15 mg by mouth as needed.     Multiple Vitamin (MULTIVITAMIN WITH MINERALS) TABS tablet Take 1 tablet by mouth daily. Centrum Silver     nystatin cream (MYCOSTATIN) Apply 1 application topically daily as needed (Yeast infection).     olmesartan (BENICAR) 20 MG tablet Take 20 mg by mouth daily.     pantoprazole (PROTONIX) 40 MG tablet Take 40 mg by mouth daily.     pantoprazole sodium (PROTONIX) 40 mg Take 40 mg by mouth every morning.      rivaroxaban (XARELTO) 20 MG TABS tablet Take 1 tablet (20 mg total) by mouth daily with supper. 90 tablet 3   rosuvastatin (CRESTOR) 20 MG tablet Take 20 mg by mouth every morning.     sulfamethoxazole-trimethoprim (BACTRIM) 400-80 MG tablet Take by mouth.     TRESIBA FLEXTOUCH 200 UNIT/ML SOPN Inject 25-30 Units as directed at bedtime. Titrate according to fasting blood glucose not to exceed 50 units a day  5   vitamin B-12 (CYANOCOBALAMIN) 1000 MCG tablet Take 1,000 mcg by mouth daily.     vitamin C (  ASCORBIC ACID) 250 MG tablet Take 250 mg by mouth daily.     vitamin E 400 UNIT capsule Take 400 Units by mouth daily.     zolpidem (AMBIEN) 10 MG tablet Take 10 mg by mouth at bedtime.     No current facility-administered medications for this visit.    Past Medical History:  Diagnosis Date   Anemia    Anxiety    Arthritis    Gout   Cataracts, both eyes    Diabetic retinopathy (HCC)    NPDR OU   Diverticulitis    GERD (gastroesophageal reflux disease)    Gout    Headache    h/o migraines   History of fracture of patella    right knee   History of positive PPD    Patient always shows positive   Hyperlipidemia    Hypertension    Hypertensive retinopathy    OU   Hypothyroidism    Lichen sclerosus 12/30/2013   of vulva   Metatarsal fracture     Neuropathy    Osteopenia    Peripheral vascular disease (HCC)    Polyneuropathy    numbness and tingling in feet and toes   Renal insufficiency    Stage 3   Sleep apnea    does not use cpap-lost weight    Type 2 diabetes mellitus, uncontrolled     Past Surgical History:  Procedure Laterality Date   ABDOMINAL HYSTERECTOMY     AMPUTATION TOE Right 05/08/2020   Procedure: AMPUTATION TOE-Right 4th Toe;  Surgeon: Rosetta Posner, DPM;  Location: [facility];  Service: Podiatry;  Laterality: Right;   AMPUTATION TOE Left 04/22/2021   Procedure: AMPUTATION TOE - 4TH METARSOPHANGEAL JOINT;  Surgeon: Rosetta Posner, DPM;  Location: [facility];  Service: Podiatry;  Laterality: Left;   APPENDECTOMY     BREAST REDUCTION SURGERY  2001   CATARACT EXTRACTION     CESAREAN SECTION  1976   COLONOSCOPY  03/05/2013   Nml - due for repeat 03/06/2018   COLONOSCOPY WITH PROPOFOL N/A 03/18/2019   Procedure: COLONOSCOPY WITH PROPOFOL;  Surgeon: Toledo, [name], MD;  Location: [facility];  Service: Gastroenterology;  Laterality: N/A;   DIAGNOSTIC LAPAROSCOPY     DILATION AND CURETTAGE OF UTERUS  1989   ENDARTERECTOMY FEMORAL Bilateral 03/09/2018   Procedure: ENDARTERECTOMY FEMORAL;  Surgeon: Annice Needy, MD;  Location: [facility];  Service: Vascular;  Laterality: Bilateral;   ENDARTERECTOMY POPLITEAL Left 03/09/2018   Procedure: ENDARTERECTOMY POPLITEAL AND SFA;  Surgeon: Annice Needy, MD;  Location: [facility];  Service: Vascular;  Laterality: Left;   ESOPHAGOGASTRODUODENOSCOPY  03/05/2013   ESOPHAGOGASTRODUODENOSCOPY (EGD) WITH PROPOFOL N/A 03/18/2019   Procedure: ESOPHAGOGASTRODUODENOSCOPY (EGD) WITH PROPOFOL;  Surgeon: Toledo, [name], MD;  Location: [facility];  Service: Gastroenterology;  Laterality: N/A;   EYE SURGERY     Eyelid Surgery  2012   INTRAMEDULLARY (IM) NAIL INTERTROCHANTERIC Left 10/30/2015   Procedure: INTRAMEDULLARY (IM) NAIL INTERTROCHANTRIC ;  Surgeon: Kennedy Bucker, MD;  Location: [facility];   Service: Orthopedics;  Laterality: Left;   KYPHOPLASTY N/A 10/25/2018   Procedure: L4 KYPHOPLASTY;  Surgeon: Kennedy Bucker, MD;  Location: [facility];  Service: Orthopedics;  Laterality: N/A;   LAPAROSCOPIC HYSTERECTOMY  2000   total   LOWER EXTREMITY ANGIOGRAPHY Left 03/08/2017   Procedure: LOWER EXTREMITY ANGIOGRAPHY;  Surgeon: Annice Needy, MD;  Location: [facility];  Service: Cardiovascular;  Laterality: Left;   LOWER EXTREMITY ANGIOGRAPHY Left 10/30/2017   Procedure:  LOWER EXTREMITY ANGIOGRAPHY;  Surgeon: Annice Needyew, Marchelle Rinella S, MD;  Location: [facility];  Service: Cardiovascular;  Laterality: Left;   LOWER EXTREMITY ANGIOGRAPHY Right 03/08/2018   Procedure: LOWER EXTREMITY ANGIOGRAPHY;  Surgeon: Annice Needyew, Joyelle Siedlecki S, MD;  Location: [facility];  Service: Cardiovascular;  Laterality: Right;   LOWER EXTREMITY ANGIOGRAPHY Left 10/01/2018   Procedure: LOWER EXTREMITY ANGIOGRAPHY;  Surgeon: Annice Needyew, Bobbiejo Ishikawa S, MD;  Location: [facility];  Service: Cardiovascular;  Laterality: Left;   LOWER EXTREMITY ANGIOGRAPHY Right 10/08/2018   Procedure: LOWER EXTREMITY ANGIOGRAPHY;  Surgeon: Annice Needyew, Divine Hansley S, MD;  Location: [facility];  Service: Cardiovascular;  Laterality: Right;   LOWER EXTREMITY ANGIOGRAPHY Right 05/07/2020   Procedure: Lower Extremity Angiography;  Surgeon: Annice Needyew, Terrisha Lopata S, MD;  Location: [facility];  Service: Cardiovascular;  Laterality: Right;   LYSIS OF ADHESION  01/25/2021   Procedure: LYSIS OF ADHESION;  Surgeon: Campbell Lernerodenberg, Denny, MD;  Location: [facility];  Service: General;;   PERIPHERAL VASCULAR INTERVENTION  03/08/2018   Procedure: PERIPHERAL VASCULAR INTERVENTION;  Surgeon: Annice Needyew, Aldan Camey S, MD;  Location: [facility];  Service: Cardiovascular;;   PORTA CATH INSERTION N/A 02/17/2020   Procedure: PORTA CATH INSERTION;  Surgeon: Annice Needyew, Robey Massmann S, MD;  Location: [facility];  Service: Cardiovascular;  Laterality: N/A;   REDUCTION MAMMAPLASTY  1997    SACROPLASTY N/A 10/25/2018   Procedure: S1 SACROPLASTY;  Surgeon: Kennedy BuckerMenz, Michael, MD;  Location: [facility];  Service: Orthopedics;  Laterality: N/A;     Social History   Tobacco Use   Smoking status: Former    Packs/day: 1.00    Years: 20.00    Total pack years: 20.00    Types: Cigarettes    Quit date: 03/07/1996    Years since quitting: 25.8    Passive exposure: Past   Smokeless tobacco: Never   Tobacco comments:    started smoking at age 77 but stopped smoking in 2000  Vaping Use   Vaping Use: Never used  Substance Use Topics   Alcohol use: No    Alcohol/week: 0.0 standard drinks of alcohol   Drug use: No       Family History  Problem Relation Age of Onset   Coronary artery disease Father    Heart attack Father    Coronary artery disease Mother    Heart attack Mother    Ovarian cancer Sister [phone]       sister had hormonal therapy for IVF txs-which increased risk factor for ovarian cancer   Breast cancer Neg Hx      No Known Allergies   REVIEW OF SYSTEMS (Negative unless checked)  Constitutional: [] Weight loss  [] Fever  [] Chills Cardiac: [] Chest pain   [] Chest pressure   [] Palpitations   [] Shortness of breath when laying flat   [] Shortness of breath at rest   [x] Shortness of breath with exertion. Vascular:  [] Pain in legs with walking   [] Pain in legs at rest   [] Pain in legs when laying flat   [] Claudication   [] Pain in feet when walking  [] Pain in feet at rest  [] Pain in feet when laying flat   [] History of DVT   [] Phlebitis   [] Swelling in legs   [] Varicose veins   [] Non-healing ulcers Pulmonary:   [] Uses home oxygen   [] Productive cough   [] Hemoptysis   [] Wheeze  [] COPD   [] Asthma Neurologic:  [] Dizziness  [] Blackouts   [] Seizures   [] History of stroke   []   History of TIA  [] Aphasia   [] Temporary blindness   [] Dysphagia   [] Weakness or numbness in arms   [] Weakness or numbness in legs Musculoskeletal:  [x] Arthritis   [] Joint swelling   [x] Joint pain   [] Low back  pain Hematologic:  [] Easy bruising  [] Easy bleeding   [] Hypercoagulable state   [x] Anemic   Gastrointestinal:  [] Blood in stool   [] Vomiting blood  [x] Gastroesophageal reflux/heartburn   [] Abdominal pain Genitourinary:  [x] Chronic kidney disease   [] Difficult urination  [] Frequent urination  [] Burning with urination   [] Hematuria Skin:  [] Rashes   [] Ulcers   [] Wounds Psychological:  [x] History of anxiety   []  History of major depression.  Physical Examination  BP 112/62   Pulse 66   Ht 5\' 5"  (1.651 m)   Wt 164 lb (74.4 kg)   BMI 27.29 kg/m  Gen:  WD/WN, NAD Head: [place]/AT, No temporalis wasting. Ear/Nose/Throat: Hearing grossly intact, nares w/o erythema or drainage Eyes: Conjunctiva clear. Sclera non-icteric Neck: Supple.  Trachea midline Pulmonary:  Good air movement, no use of accessory muscles.  Cardiac: RRR, no JVD Vascular:  Vessel Right Left  Radial Palpable Palpable                          PT Palpable Palpable  DP Palpable Palpable   Gastrointestinal: soft, non-tender/non-distended. No guarding/reflex.  Musculoskeletal: M/S 5/5 throughout.  No deformity or atrophy. No edema. Neurologic: Sensation grossly intact in extremities.  Symmetrical.  Speech is fluent.  Psychiatric: Judgment intact, Mood & affect appropriate for pt's clinical situation. Dermatologic: No rashes or ulcers noted.  No cellulitis or open wounds.      Labs Recent Results (from the past 2160 hour(s))  Hemoglobin and hematocrit, blood     Status: Abnormal   Collection Time: 10/21/21  9:36 AM  Result Value Ref Range   Hemoglobin 9.2 (L) 12.0 - 15.0 g/dL   HCT (L) - %    Comment: Performed at [facility], [address] [place],   Hemoglobin and hematocrit, blood     Status: Abnormal   Collection Time: 11/04/21  2:34 PM  Result Value Ref Range   Hemoglobin 9.1 (L) 12.0 - 15.0 g/dL   HCT (L) - %    Comment: Performed at [facility], [address] [place],   Haptoglobin     Status: None   Collection Time: 11/16/21  2:25 PM  Result Value Ref Range   Haptoglobin 158 42 - 346 mg/dL    Comment: (NOTE) Performed At: [facility] [address] [place],   [place]   Lactate dehydrogenase     Status: None   Collection Time: 11/16/21  2:25 PM  Result Value Ref Range   LDH 119 98 - 192 U/L    Comment: Performed at [facility], [address] [place], 34.1 93.7  CBC with Differential/Platelet     Status: Abnormal   Collection Time: 11/16/21  2:25 PM  Result Value Ref Range   WBC 6.2 4.0 - 10.5 K/uL   RBC 2.93 (L) 3.87 - 5.11 MIL/uL   Hemoglobin 8.5 (L) 12.0 - 15.0 g/dL   HCT [address] (L) [place] %   MCV 92.8 80.0 - 100.0 fL   MCH 29.0 26.0 - 34.0 pg   MCHC 31.3 30.0 - 36.0 g/dL   RDW 90240 (H) 13/02/23 - 97.3 %  Platelets 297 150 - 400 K/uL   nRBC 0.0 0.0 - 0.2 %   Neutrophils Relative % 66 %   Neutro Abs 4.0 1.7 - 7.7 K/uL   Lymphocytes Relative 19 %   Lymphs Abs 1.2 0.7 - 4.0 K/uL   Monocytes Relative 11 %   Monocytes Absolute 0.7 0.1 - 1.0 K/uL   Eosinophils Relative 3 %   Eosinophils Absolute 0.2 0.0 - 0.5 K/uL   Basophils Relative 1 %   Basophils Absolute 0.1 0.0 - 0.1 K/uL   Immature Granulocytes 0 %   Abs Immature Granulocytes 0.02 0.00 - 0.07 K/uL    Comment: Performed at [facility], [address] [place], [place] [address]  Basic metabolic panel     Status: Abnormal   Collection Time: 11/16/21  2:25 PM  Result Value Ref Range   Sodium 136 135 - 145 mmol/L   Potassium 4.8 3.5 - 5.1 mmol/L   Chloride 105 98 - 111 mmol/L   CO2 23 22 - 32 mmol/L   Glucose, Bld 122 (H) 70 - 99 mg/dL    Comment: Glucose reference range applies only to samples taken after fasting for at least 8 hours.   BUN 40 (H) 8 - 23 mg/dL   Creatinine, Ser 1.64 (H) 0.44 - 1.00 mg/dL   Calcium 10.0 8.9 - 10.3 mg/dL   GFR, Estimated  32 (L) >60 mL/min    Comment: (NOTE) Calculated using the CKD-EPI Creatinine Equation (2021)    Anion gap 8 5 - 15    Comment: Performed at [facility], [address] [place], [place] [address]  Hemoglobin and hematocrit, blood     Status: Abnormal   Collection Time: 11/23/21  9:26 AM  Result Value Ref Range   Hemoglobin 7.9 (L) 12.0 - 15.0 g/dL   HCT 26.0 (L) 36.0 - 46.0 %    Comment: Performed at [facility], [address], [place], [place] [address]  Lactate dehydrogenase     Status: None   Collection Time: 12/06/21 10:51 AM  Result Value Ref Range   LDH 117 98 - 192 U/L    Comment: Performed at [facility], [address] [place], [place] [address]  Iron and TIBC([facility])     Status: None   Collection Time: 12/06/21 10:51 AM  Result Value Ref Range   Iron 49 28 - 170 ug/dL   TIBC 346 250 - 450 ug/dL   Saturation Ratios 14 10.4 - 31.8 %   UIBC 297 ug/dL    Comment: Performed at [facility], [address] [place], [place] [address]  Ferritin     Status: None   Collection Time: 12/06/21 10:51 AM  Result Value Ref Range   Ferritin 66 11 - 307 ng/mL    Comment: Performed at [facility], [address] [place], [place] [address]  Comprehensive metabolic panel     Status: Abnormal   Collection Time: 12/06/21 10:51 AM  Result Value Ref Range   Sodium 139 135 - 145 mmol/L   Potassium 4.7 3.5 - 5.1 mmol/L   Chloride 108 98 - 111 mmol/L   CO2 21 (L) 22 - 32 mmol/L   Glucose, Bld 136 (H) 70 - 99 mg/dL    Comment: Glucose reference range applies only to samples taken after fasting for at least 8 hours.   BUN 30 (H) 8 - 23 mg/dL   Creatinine, Ser 1.62 (H) 0.44 - 1.00 mg/dL   Calcium  9.5 8.9 - 10.3 mg/dL   Total Protein 6.9 6.5 - 8.1 g/dL   Albumin 3.9 3.5 - 5.0 g/dL   AST 25 15 - 41 U/L   ALT 18 0 - 44 U/L   Alkaline Phosphatase 50 38 - 126 U/L   Total Bilirubin 0.6 0.3 - 1.2 mg/dL   GFR, Estimated 33 (L) >60 mL/min     Comment: (NOTE) Calculated using the CKD-EPI Creatinine Equation (2021)    Anion gap 10 5 - 15    Comment: Performed at [facility], [address] [place], [place] [address]  CBC with Differential/Platelet     Status: Abnormal   Collection Time: 12/06/21 10:51 AM  Result Value Ref Range   WBC 5.1 4.0 - 10.5 K/uL   RBC 3.47 (L) 3.87 - 5.11 MIL/uL   Hemoglobin 10.1 (L) 12.0 - 15.0 g/dL   HCT 15.1 (L) 76.1 - 60.7 %   MCV 93.9 80.0 - 100.0 fL   MCH 29.1 26.0 - 34.0 pg   MCHC 31.0 30.0 - 36.0 g/dL   RDW 37.1 (H) 06.2 - 69.4 %   Platelets 271 150 - 400 K/uL   nRBC 0.0 0.0 - 0.2 %   Neutrophils Relative % 67 %   Neutro Abs 3.4 1.7 - 7.7 K/uL   Lymphocytes Relative 17 %   Lymphs Abs 0.8 0.7 - 4.0 K/uL   Monocytes Relative 12 %   Monocytes Absolute 0.6 0.1 - 1.0 K/uL   Eosinophils Relative 3 %   Eosinophils Absolute 0.2 0.0 - 0.5 K/uL   Basophils Relative 1 %   Basophils Absolute 0.0 0.0 - 0.1 K/uL   Immature Granulocytes 0 %   Abs Immature Granulocytes 0.01 0.00 - 0.07 K/uL    Comment: Performed at [facility], [address] [place], [place] [address]  Hemoglobin and hematocrit, blood     Status: Abnormal   Collection Time: 12/20/21  1:33 PM  Result Value Ref Range   Hemoglobin 10.3 (L) 12.0 - 15.0 g/dL   HCT 70.3 (L) 50.0 - 93.8 %    Comment: Performed at [facility], [address] [place], [place] [address]  Hemoglobin and hematocrit, blood     Status: Abnormal   Collection Time: 01/04/22  2:21 PM  Result Value Ref Range   Hemoglobin 9.8 (L) 12.0 - 15.0 g/dL   HCT 37.1 (L) 69.6 - 78.9 %    Comment: Performed at [facility], [address] [place], [place] [address]    Radiology Intravitreal Injection, Pharmacologic Agent - OD - Right Eye  Result Date: 01/05/2022 Time Out 01/05/2022. 3:17 PM. Confirmed correct patient, procedure, site, and patient consented. Anesthesia Topical anesthesia was used. Anesthetic medications included  Lidocaine 2%, Proparacaine 0.5%. Procedure Preparation included 5% betadine to ocular surface, eyelid speculum. A (32 g) needle was used. Injection: 6 mg faricimab-svoa 6 MG/0.05ML   Route: Intravitreal, Site: Right Eye   NDC: [phone], Lot: [id number], Expiration date: 01/03/2024, Waste: 0 mL Post-op Post injection exam found visual acuity of at least counting fingers. The patient tolerated the procedure well. There were no complications. The patient received written and verbal post procedure care education. Post injection medications were not given.   OCT, Retina - OU - Both Eyes  Result Date: 01/05/2022 Right Eye Quality was good. Central Foveal Thickness: 345. Progression has improved. Findings include no SRF, abnormal foveal contour, epiretinal membrane, intraretinal fluid (Interval improvement in IRF and foveal contour, just trace cystic  changes remain). Left Eye Quality was good. Central Foveal Thickness: 292. Progression has been stable. Findings include normal foveal contour, no IRF, no SRF (Trace ERM). Notes *Images captured and stored on drive Diagnosis / Impression: OD: BRVO w/ CME - Interval improvement in IRF and foveal contour, just trace cystic changes remain OS: NFP; no IRF/SRF--stable, trace ERM Clinical management: See below Abbreviations: NFP - Normal foveal profile. CME - cystoid macular edema. PED - pigment epithelial detachment. IRF - intraretinal fluid. SRF - subretinal fluid. EZ - ellipsoid zone. ERM - epiretinal membrane. ORA - outer retinal atrophy. ORT - outer retinal tubulation. 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  Doing well after multiple previous interventions and surgery.  Recheck in 6 months with noninvasive studies.  No change in medical regimen.  Carotid atherosclerosis,  bilateral Carotid duplex today reveals 1 to 39% ICA stenosis bilaterally without significant progression from previous studies a couple of years ago.  On appropriate medical therapy already mostly for PAD but will cover her carotid disease as well.  No role for intervention.  Recheck in 2 years.  Benign essential hypertension blood pressure control important in reducing the progression of atherosclerotic disease. On appropriate oral medications.   Type 2 diabetes mellitus with vascular disease (HCC) blood glucose control important in reducing the progression of atherosclerotic disease. Also, involved in wound healing. On appropriate medications.   Stage 3b chronic kidney disease (HCC) Avoid contrast unless absolutely necessary.  Hyperlipidemia, mixed lipid control important in reducing the progression of atherosclerotic disease. Continue statin therapy    Festus BarrenJason Placido Hangartner, MD  01/18/2022 11:42 AM    This note was created with Dragon medical transcription system.  Any errors from dictation are purely unintentional

## 2022-01-18 NOTE — Assessment & Plan Note (Signed)
Carotid duplex today reveals 1 to 39% ICA stenosis bilaterally without significant progression from previous studies a couple of years ago.  On appropriate medical therapy already mostly for PAD but will cover her carotid disease as well.  No role for intervention.  Recheck in 2 years.

## 2022-01-18 NOTE — Assessment & Plan Note (Signed)
blood glucose control important in reducing the progression of atherosclerotic disease. Also, involved in wound healing. On appropriate medications.  

## 2022-01-26 NOTE — Progress Notes (Signed)
[facility] Note  02/02/2022     CHIEF COMPLAINT Patient presents for Retina Follow Up   HISTORY OF PRESENT ILLNESS: Cheryl Leonard is a 77 y.o. female who presents to the clinic today for:    HPI     Retina Follow Up   Patient presents with  CRVO/BRVO.  In right eye.  This started 4 weeks ago.  I, the attending physician,  performed the HPI with the patient and updated documentation appropriately.        Comments   Patient here for 4 weeks retina follow up for BRVO OD. Patient states vision the same. Pretty good. No eye pain.       Last edited by Bernarda Caffey, MD on 02/02/2022  4:08 PM.    Pt states vision is stable   Referring physician: Rusty Aus, [place],  [place] [id number]   HISTORICAL INFORMATION:   Selected notes from the MEDICAL RECORD NUMBER Referred by Dr. Marvel Plan for concern of DME OD Lab Results  Component Value Date   HGBA1C 7.3 (H) 01/11/2021     CURRENT MEDICATIONS: No current outpatient medications on file. (Ophthalmic Drugs)   No current facility-administered medications for this visit. (Ophthalmic Drugs)   Current Outpatient Medications (Other)  Medication Sig   ALPRAZolam (XANAX) 0.25 MG tablet Take 0.25 mg by mouth daily as needed for anxiety.   amLODipine (NORVASC) 5 MG tablet Take 5 mg by mouth 2 (two) times daily.   aspirin EC 81 MG tablet Take 1 tablet (81 mg total) by mouth daily.   cholecalciferol (VITAMIN D) 1000 units tablet Take 1,000 Units by mouth 2 (two) times daily.   dapagliflozin propanediol (FARXIGA) 5 MG TABS tablet Take 5 mg by mouth daily.   denosumab (PROLIA) 60 MG/ML SOLN injection Inject 60 mg into the skin every 6 (six) months.    escitalopram (LEXAPRO) 10 MG tablet Take 10 mg by mouth daily.   estradiol (ESTRACE) 0.1 MG/GM vaginal cream Place 1 Applicatorful vaginally daily as needed (vaginal irritation).   famotidine  (PEPCID) 40 MG tablet Take 40 mg by mouth daily.   furosemide (LASIX) 20 MG tablet Take 20 mg by mouth every Wednesday.   gabapentin (NEURONTIN) 300 MG capsule Take 300 mg by mouth at bedtime as needed (pain).   HYDROcodone-acetaminophen (NORCO/VICODIN) 5-325 MG tablet Take 1 tablet by mouth daily.   levothyroxine (SYNTHROID, LEVOTHROID) 100 MCG tablet Take 100 mcg by mouth daily before breakfast.    lidocaine-prilocaine (EMLA) cream Apply 1 application topically as needed (apply prior to port a cath access).   Magnesium 500 MG TABS Take 500 mg by mouth every morning.    metoprolol succinate (TOPROL-XL) 50 MG 24 hr tablet Take 50 mg by mouth daily. Take with or immediately following a meal.   mirtazapine (REMERON) 15 MG tablet Take 15 mg by mouth as needed.   Multiple Vitamin (MULTIVITAMIN WITH MINERALS) TABS tablet Take 1 tablet by mouth daily. Centrum Silver   NOVOLOG FLEXPEN 100 UNIT/ML FlexPen SMARTSIG:8 Unit(s) SUB-Q Morning-Evening   nystatin cream (MYCOSTATIN) Apply 1 application topically daily as needed (Yeast infection).   olmesartan (BENICAR) 20 MG tablet Take 20 mg by mouth daily.   pantoprazole (PROTONIX) 40 MG tablet Take 40 mg by mouth daily.   pantoprazole sodium (PROTONIX) 40 mg Take 40 mg by mouth every morning.    rivaroxaban (XARELTO) 20 MG TABS tablet Take 1 tablet (  20 mg total) by mouth daily with supper.   rosuvastatin (CRESTOR) 20 MG tablet Take 20 mg by mouth every morning.   TRESIBA FLEXTOUCH 200 UNIT/ML SOPN Inject 25-30 Units as directed at bedtime. Titrate according to fasting blood glucose not to exceed 50 units a day   vitamin B-12 (CYANOCOBALAMIN) 1000 MCG tablet Take 1,000 mcg by mouth daily.   vitamin C (ASCORBIC ACID) 250 MG tablet Take 250 mg by mouth daily.   vitamin E 400 UNIT capsule Take 400 Units by mouth daily.   Zinc Sulfate (ZINC 15 PO) Take 1 tablet by mouth daily.   zolpidem (AMBIEN) 10 MG tablet Take 10 mg by mouth at bedtime.   No current  facility-administered medications for this visit. (Other)   REVIEW OF SYSTEMS: ROS   Positive for: Gastrointestinal, Musculoskeletal, Endocrine, Cardiovascular, Eyes, Respiratory Negative for: Constitutional, Neurological, Skin, Genitourinary, HENT, Psychiatric, Allergic/Imm, Heme/Lymph Last edited by Laddie Aquas, COA on 02/02/2022  2:57 PM.     ALLERGIES No Known Allergies  PAST MEDICAL HISTORY Past Medical History:  Diagnosis Date   Anemia    Anxiety    Arthritis    Gout   Cataracts, both eyes    Diabetic retinopathy (HCC)    NPDR OU   Diverticulitis    GERD (gastroesophageal reflux disease)    Gout    Headache    h/o migraines   History of fracture of patella    right knee   History of positive PPD    Patient always shows positive   Hyperlipidemia    Hypertension    Hypertensive retinopathy    OU   Hypothyroidism    Lichen sclerosus 12/30/2013   of vulva   Metatarsal fracture    Neuropathy    Osteopenia    Peripheral vascular disease (HCC)    Polyneuropathy    numbness and tingling in feet and toes   Renal insufficiency    Stage 3   Sleep apnea    does not use cpap-lost weight    Type 2 diabetes mellitus, uncontrolled    Past Surgical History:  Procedure Laterality Date   ABDOMINAL HYSTERECTOMY     AMPUTATION TOE Right 05/08/2020   Procedure: AMPUTATION TOE-Right 4th Toe;  Surgeon: Rosetta Posner, DPM;  Location: [facility];  Service: Podiatry;  Laterality: Right;   AMPUTATION TOE Left 04/22/2021   Procedure: AMPUTATION TOE - 4TH METARSOPHANGEAL JOINT;  Surgeon: Rosetta Posner, DPM;  Location: [facility];  Service: Podiatry;  Laterality: Left;   APPENDECTOMY     BREAST REDUCTION SURGERY  2001   CATARACT EXTRACTION     CESAREAN SECTION  1976   COLONOSCOPY  03/05/2013   Nml - due for repeat 03/06/2018   COLONOSCOPY WITH PROPOFOL N/A 03/18/2019   Procedure: COLONOSCOPY WITH PROPOFOL;  Surgeon: Toledo, [name], MD;  Location: [facility];  Service:  Gastroenterology;  Laterality: N/A;   DIAGNOSTIC LAPAROSCOPY     DILATION AND CURETTAGE OF UTERUS  1989   ENDARTERECTOMY FEMORAL Bilateral 03/09/2018   Procedure: ENDARTERECTOMY FEMORAL;  Surgeon: Annice Needy, MD;  Location: [facility];  Service: Vascular;  Laterality: Bilateral;   ENDARTERECTOMY POPLITEAL Left 03/09/2018   Procedure: ENDARTERECTOMY POPLITEAL AND SFA;  Surgeon: Annice Needy, MD;  Location: [facility];  Service: Vascular;  Laterality: Left;   ESOPHAGOGASTRODUODENOSCOPY  03/05/2013   ESOPHAGOGASTRODUODENOSCOPY (EGD) WITH PROPOFOL N/A 03/18/2019   Procedure: ESOPHAGOGASTRODUODENOSCOPY (EGD) WITH PROPOFOL;  Surgeon: Toledo, [name], MD;  Location: [facility];  Service: Gastroenterology;  Laterality: N/A;   EYE SURGERY     Eyelid Surgery  2012   INTRAMEDULLARY (IM) NAIL INTERTROCHANTERIC Left 10/30/2015   Procedure: INTRAMEDULLARY (IM) NAIL INTERTROCHANTRIC ;  Surgeon: Hessie Knows, MD;  Location: [facility];  Service: Orthopedics;  Laterality: Left;   KYPHOPLASTY N/A 10/25/2018   Procedure: L4 KYPHOPLASTY;  Surgeon: Hessie Knows, MD;  Location: [facility];  Service: Orthopedics;  Laterality: N/A;   LAPAROSCOPIC HYSTERECTOMY  2000   total   LOWER EXTREMITY ANGIOGRAPHY Left 03/08/2017   Procedure: LOWER EXTREMITY ANGIOGRAPHY;  Surgeon: Algernon Huxley, MD;  Location: [place] [facility];  Service: Cardiovascular;  Laterality: Left;   LOWER EXTREMITY ANGIOGRAPHY Left 10/30/2017   Procedure: LOWER EXTREMITY ANGIOGRAPHY;  Surgeon: Algernon Huxley, MD;  Location: [place] [facility];  Service: Cardiovascular;  Laterality: Left;   LOWER EXTREMITY ANGIOGRAPHY Right 03/08/2018   Procedure: LOWER EXTREMITY ANGIOGRAPHY;  Surgeon: Algernon Huxley, MD;  Location: [place] [facility];  Service: Cardiovascular;  Laterality: Right;   LOWER EXTREMITY ANGIOGRAPHY Left 10/01/2018   Procedure: LOWER EXTREMITY ANGIOGRAPHY;  Surgeon: Algernon Huxley, MD;  Location: [place] [facility];  Service: Cardiovascular;   Laterality: Left;   LOWER EXTREMITY ANGIOGRAPHY Right 10/08/2018   Procedure: LOWER EXTREMITY ANGIOGRAPHY;  Surgeon: Algernon Huxley, MD;  Location: [place] [facility];  Service: Cardiovascular;  Laterality: Right;   LOWER EXTREMITY ANGIOGRAPHY Right 05/07/2020   Procedure: Lower Extremity Angiography;  Surgeon: Algernon Huxley, MD;  Location: [facility];  Service: Cardiovascular;  Laterality: Right;   LYSIS OF ADHESION  01/25/2021   Procedure: LYSIS OF ADHESION;  Surgeon: Ronny Bacon, MD;  Location: [facility];  Service: General;;   PERIPHERAL VASCULAR INTERVENTION  03/08/2018   Procedure: PERIPHERAL VASCULAR INTERVENTION;  Surgeon: Algernon Huxley, MD;  Location: [place] [facility];  Service: Cardiovascular;;   PORTA CATH INSERTION N/A 02/17/2020   Procedure: PORTA CATH INSERTION;  Surgeon: Algernon Huxley, MD;  Location: [place] [facility];  Service: Cardiovascular;  Laterality: N/A;   REDUCTION MAMMAPLASTY  1997   SACROPLASTY N/A 10/25/2018   Procedure: S1 SACROPLASTY;  Surgeon: Hessie Knows, MD;  Location: [facility];  Service: Orthopedics;  Laterality: N/A;   FAMILY HISTORY Family History  Problem Relation Age of Onset   Coronary artery disease Father    Heart attack Father    Coronary artery disease Mother    Heart attack Mother    Ovarian cancer Sister 96       sister had hormonal therapy for IVF txs-which increased risk factor for ovarian cancer   Breast cancer Neg Hx    SOCIAL HISTORY Social History   Tobacco Use   Smoking status: Former    Packs/day: 1.00    Years: 20.00    Total pack years: 20.00    Types: Cigarettes    Quit date: 03/07/1996    Years since quitting: 25.9    Passive exposure: Past   Smokeless tobacco: Never   Tobacco comments:    started smoking at age 5 but stopped smoking in 2000  Vaping Use   Vaping Use: Never used  Substance Use Topics   Alcohol use: No    Alcohol/week: 0.0 standard drinks of alcohol   Drug use: No       OPHTHALMIC  EXAM: Base Eye Exam     Visual Acuity (Snellen - Linear)       Right Left   Dist Orient 20/30 20/20 -1   Dist  ph [place] NI          Tonometry (Tonopen, 2:55 PM)       Right Left   Pressure 16 13         Pupils       Dark Light Shape React APD   Right 3 2 Round Brisk None   Left 3 2 Round Brisk None         Visual Fields (Counting fingers)       Left Right    Full Full         Extraocular Movement       Right Left    Full, Ortho Full, Ortho         Neuro/Psych     Oriented x3: Yes   Mood/Affect: Normal         Dilation     Both eyes: 1.0% Mydriacyl, 2.5% Phenylephrine @ 2:55 PM           Slit Lamp and Fundus Exam     External Exam       Right Left   External Normal Normal         Slit Lamp Exam       Right Left   Lids/Lashes dermatochalasis dermatochalasis   Conjunctiva/Sclera White and quiet White and quiet   Cornea arcus; well healed cataract wound; 2-3+ diffuse Punctate epithelial erosions, decreased TBUT, mild Anterior basement membrane dystrophy superiorly arcus; well healed cataract wound, 2-3+ diffuse Punctate epithelial erosions, irregualr epi surface, decreased TBUT   Anterior Chamber Deep and quiet Deep and quiet   Iris Round and dilated Round and dilated   Lens PCIOL; open PC PCIOL; open PC   Anterior Vitreous syneresis, Posterior vitreous detachment, vitreous condensations inferiorly syneresis, Posterior vitreous detachment         Fundus Exam       Right Left   Disc Superior hyperemia, mild Pallor Pink and Sharp   C/D Ratio 0.6 0.5   Macula Flat, Blunted foveal reflex, +cystic changes - improved, +Epiretinal membrane, minimal MA, +PED flat; good foveal reflex, no heme or edema, small pigment clump IT to fovea   Vessels attenuated, Tortuous attenuated, Tortuous   Periphery Attached; scattered DBH -- greastest temporal periphery Attached, no heme           IMAGING AND PROCEDURES  Imaging and Procedures for  04/25/17  OCT, Retina - OU - Both Eyes       Right Eye Quality was good. Central Foveal Thickness: 324. Progression has improved. Findings include no SRF, abnormal foveal contour, epiretinal membrane, intraretinal fluid (Interval improvement in IRF and foveal contour, just trace cystic changes remain).   Left Eye Quality was good. Central Foveal Thickness: 279. Progression has been stable. Findings include normal foveal contour, no IRF, no SRF (Trace ERM).   Notes *Images captured and stored on drive  Diagnosis / Impression:  OD: BRVO w/ CME - Interval improvement in IRF and foveal contour, just trace cystic changes remain OS: NFP; no IRF/SRF--stable, trace ERM  Clinical management:  See below  Abbreviations: NFP - Normal foveal profile. CME - cystoid macular edema. PED - pigment epithelial detachment. IRF - intraretinal fluid. SRF - subretinal fluid. EZ - ellipsoid zone. ERM - epiretinal membrane. ORA - outer retinal atrophy. ORT - outer retinal tubulation. SRHM - subretinal hyper-reflective material      Intravitreal Injection, Pharmacologic Agent - OD - Right Eye       Time Out 02/02/2022. 3:22 PM. Confirmed correct  patient, procedure, site, and patient consented.   Anesthesia Topical anesthesia was used. Anesthetic medications included Lidocaine 2%, Proparacaine 0.5%.   Procedure Preparation included 5% betadine to ocular surface, eyelid speculum. A (32 g) needle was used.   Injection: 6 mg faricimab-svoa 6 MG/0.05ML   Route: Intravitreal, Site: Right Eye   NDC: [phone], Lot: [id number], Expiration date: 01/02/2024, Waste: 0 mL   Post-op Post injection exam found visual acuity of at least counting fingers. The patient tolerated the procedure well. There were no complications. The patient received written and verbal post procedure care education. Post injection medications were not given.            ASSESSMENT/PLAN:    ICD-10-CM   1. Branch retinal vein  occlusion of right eye with macular edema  H34.8310 OCT, Retina - OU - Both Eyes    Intravitreal Injection, Pharmacologic Agent - OD - Right Eye    faricimab-svoa (VABYSMO) 6mg /0.6mL intravitreal injection    2. Both eyes affected by mild nonproliferative diabetic retinopathy with macular edema, associated with type 2 diabetes mellitus (HCC)  0m     3. Essential hypertension  I10     4. Hypertensive retinopathy of both eyes  H35.033     5. Epiretinal membrane (ERM) of right eye  H35.371     6. Pseudophakia of both eyes  Z96.1      1. BRVO w/ CME OD  - by history, pt states symptoms first noticed 2 wks prior to presentation, but reports changes may have occurred prior  - initial exam with differential tortuosity of vessels (OD > OS)  - FA (02.10.20) shows mild late staining / leakage in macula, staining / leakage of disc -- improving CME  - differential includes DM2 (DME), hypertensive retinopathy, inflammatory etiology / uveitis  - S/P IVA OD #1 (02.08.19), #2 (03.11.19), #3 (04.09.19), #4 (05.20.19), #5 (02.10.20)  - gave IVA OD on 2.10.20 due to pending Eylea4U for 2020 -- resulted in increased IRF/CME  - review of OCTs show persistent IRF and cystic changes --  resistance to IVA   - June 2019 -- switched therapies: S/P IVE OD #1 (06.24.19), #2 (07.24.19), #3 (09.04.19), #4 (10.30.19),#5 (12.30.19), #6 (03.23.20), #7 (05.05.20), #8 (07.16.20), #9 (07.17.20), #10 (08.28.20), #11 (10.13.20), # 12 (11.17.20), #13 (2.8.21), #14 (03.09.21), #15 (04.13.21), #16 (05.11.21), #17 (06.17.21), #18 (07.23.21), #19 (08.30.21), #20 (10.04.21), #21 (11.08.21), #22 (12.08.21), #23 (01.31.22), #24 (02.28.22), #25 (04.01.22), #26 (06.15.22), #27 (07.13.22), #28 (08.17.22), #29 (09.21.22), #30 (10.19.22), #31 (11.16.22), #32 (12.16.22), #33 (01.13.23), #34 (02.10.23), #35 (03.15.23), #36 (04.14.23), #37 (05.15.23), #38 (06.12.23), #39 (07.10.23), #40 (08.07.23), #41 (09.06.23), #42 (10.04.23), #43  (11.01.23) -- IVE resistance  - s/p IVV OD #1 (12.04.23), #2 (01.03.24)  - OCT today shows Interval improvement in IRF and foveal contour, just trace cystic changes remain at 4 wks  - BCVA 20/30 -- stable  - recommend IVV OD #2 today, 01.31.24 w/ f/u in 4 weeks  - RBA of procedure discussed, questions answered  - informed consent obtained  - see procedure note   - Vabysmo informed consent form obtained, signed and scanned on 12.04.23  - Eylea informed consent form obtained, re-signed and scanned on 05.15.23  - Eylea4U benefits investigation completed and pt approved for IVE for 2023  - f/u 4 weeks  -- DFE/OCT/possible injection  2. Mild nonproliferative diabetic retinopathy, both eyes  - A1c 6.2 on 09.12.23; 6.6 on 07.01.23; 6.0 on 10.14.22  - could be contributing to CME OD  -  OS with minimal diabetic retinopathy  - continue to monitor  3,4. Hypertensive retinopathy OU - stable  - as above, may have contributing to CME OD  - discussed importance of tight BP control  - monitor  5. Epiretinal membrane, right eye   - stable nasal ERM  - no indication for surgery at this time  6. Pseudophakia OU  - s/p CE/IOL OU by cataract surgeon [facility]  - doing well  - monitor  Ophthalmic Meds Ordered this visit:  Meds ordered this encounter  Medications   faricimab-svoa (VABYSMO) 6mg /0.83mL intravitreal injection     Return in about 4 weeks (around 03/02/2022) for f/u BRVO OD, DFE, OCT.  This document serves as a record of services personally performed by 03/04/2022, MD, PhD. It was created on their behalf by Karie Chimera, an ophthalmic technician. The creation of this record is the provider's dictation and/or activities during the visit.    Electronically signed by: De Blanch, OA, 02/02/22  4:09 PM  This document serves as a record of services personally performed by 02/04/22, MD, PhD. It was created on their behalf by Karie Chimera. Glee Arvin, OA an ophthalmic technician.  The creation of this record is the provider's dictation and/or activities during the visit.    Electronically signed by: Manson Passey. Bishop, Tobel 01.31.2024 4:09 PM  02.02.2024, M.D., Ph.D. Diseases & Surgery of the Retina and Vitreous [facility]  I have reviewed the above documentation for accuracy and completeness, and I agree with the above. [facility], M.D., Ph.D. 02/02/22 4:28 PM  Abbreviations: M myopia (nearsighted); A astigmatism; H hyperopia (farsighted); P presbyopia; Mrx spectacle prescription;  CTL contact lenses; OD right eye; OS left eye; OU both eyes  XT exotropia; ET esotropia; PEK punctate epithelial keratitis; PEE punctate epithelial erosions; DES dry eye syndrome; MGD meibomian gland dysfunction; ATs artificial tears; PFAT's preservative free artificial tears; NSC nuclear sclerotic cataract; PSC posterior subcapsular cataract; ERM epi-retinal membrane; PVD posterior vitreous detachment; RD retinal detachment; DM diabetes mellitus; DR diabetic retinopathy; NPDR non-proliferative diabetic retinopathy; PDR proliferative diabetic retinopathy; CSME clinically significant macular edema; DME diabetic macular edema; dbh dot blot hemorrhages; CWS cotton wool spot; POAG primary open angle glaucoma; C/D cup-to-disc ratio; HVF humphrey visual field; GVF goldmann visual field; OCT optical coherence tomography; IOP intraocular pressure; BRVO Branch retinal vein occlusion; CRVO central retinal vein occlusion; CRAO central retinal artery occlusion; BRAO branch retinal artery occlusion; RT retinal tear; SB scleral buckle; PPV pars plana vitrectomy; VH Vitreous hemorrhage; PRP panretinal laser photocoagulation; IVK intravitreal kenalog; VMT vitreomacular traction; MH Macular hole;  NVD neovascularization of the disc; NVE neovascularization elsewhere; AREDS age related eye disease study; ARMD age related macular degeneration; POAG primary open angle glaucoma; EBMD  epithelial/anterior basement membrane dystrophy; ACIOL anterior chamber intraocular lens; IOL intraocular lens; PCIOL posterior chamber intraocular lens; Phaco/IOL phacoemulsification with intraocular lens placement; PRK photorefractive keratectomy; LASIK laser assisted in situ keratomileusis; HTN hypertension; DM diabetes mellitus; COPD chronic obstructive pulmonary disease

## 2022-01-31 ENCOUNTER — Other Ambulatory Visit: Payer: Self-pay | Admitting: *Deleted

## 2022-01-31 DIAGNOSIS — N183 Chronic kidney disease, stage 3 unspecified: Secondary | ICD-10-CM

## 2022-01-31 MED FILL — Iron Sucrose Inj 20 MG/ML (Fe Equiv): INTRAVENOUS | Qty: 10 | Status: AC

## 2022-02-01 ENCOUNTER — Inpatient Hospital Stay: Payer: Medicare Other

## 2022-02-01 ENCOUNTER — Encounter: Payer: Self-pay | Admitting: Internal Medicine

## 2022-02-01 ENCOUNTER — Ambulatory Visit: Payer: PRIVATE HEALTH INSURANCE

## 2022-02-01 ENCOUNTER — Inpatient Hospital Stay (HOSPITAL_BASED_OUTPATIENT_CLINIC_OR_DEPARTMENT_OTHER): Payer: Medicare Other | Admitting: Internal Medicine

## 2022-02-01 DIAGNOSIS — I129 Hypertensive chronic kidney disease with stage 1 through stage 4 chronic kidney disease, or unspecified chronic kidney disease: Secondary | ICD-10-CM | POA: Diagnosis not present

## 2022-02-01 DIAGNOSIS — N183 Chronic kidney disease, stage 3 unspecified: Secondary | ICD-10-CM

## 2022-02-01 DIAGNOSIS — D649 Anemia, unspecified: Secondary | ICD-10-CM

## 2022-02-01 LAB — CBC WITH DIFFERENTIAL/PLATELET
Abs Immature Granulocytes: 0.04 10*3/uL (ref 0.00–0.07)
Basophils Absolute: 0 10*3/uL (ref 0.0–0.1)
Basophils Relative: 1 %
Eosinophils Absolute: 0.1 10*3/uL (ref 0.0–0.5)
Eosinophils Relative: 2 %
HCT: 24.2 % — ABNORMAL LOW (ref 36.0–46.0)
Hemoglobin: 7.7 g/dL — ABNORMAL LOW (ref 12.0–15.0)
Immature Granulocytes: 1 %
Lymphocytes Relative: 23 %
Lymphs Abs: 1.4 10*3/uL (ref 0.7–4.0)
MCH: 30.9 pg (ref 26.0–34.0)
MCHC: 31.8 g/dL (ref 30.0–36.0)
MCV: 97.2 fL (ref 80.0–100.0)
Monocytes Absolute: 0.9 10*3/uL (ref 0.1–1.0)
Monocytes Relative: 15 %
Neutro Abs: 3.4 10*3/uL (ref 1.7–7.7)
Neutrophils Relative %: 58 %
Platelets: 286 10*3/uL (ref 150–400)
RBC: 2.49 MIL/uL — ABNORMAL LOW (ref 3.87–5.11)
RDW: 18.3 % — ABNORMAL HIGH (ref 11.5–15.5)
WBC: 5.9 10*3/uL (ref 4.0–10.5)
nRBC: 0.9 % — ABNORMAL HIGH (ref 0.0–0.2)

## 2022-02-01 MED ORDER — SODIUM CHLORIDE 0.9 % IV SOLN
Freq: Once | INTRAVENOUS | Status: AC
  Filled 2022-02-01: qty 250

## 2022-02-01 MED ORDER — HEPARIN SOD (PORK) LOCK FLUSH 100 UNIT/ML IV SOLN
500.0000 [IU] | Freq: Once | INTRAVENOUS | Status: AC | PRN
  Administered 2022-02-01: 500 [IU]
  Filled 2022-02-01: qty 5

## 2022-02-01 MED ORDER — SODIUM CHLORIDE 0.9 % IV SOLN
200.0000 mg | Freq: Once | INTRAVENOUS | Status: AC
  Administered 2022-02-01: 200 mg via INTRAVENOUS
  Filled 2022-02-01: qty 200

## 2022-02-01 NOTE — Assessment & Plan Note (Addendum)
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  If Retacrit/iron does not improve consider bone marrow biopsy.  # JAN 2024- Ferritin- 24; I sat-23-hemoglobin 7.7 proceed with venofer weekly x4. Proceed with venofer today.  Retacrit tomorrow.   # Etiology-CKD-III--IV; GFR ~21 overall worsened [Dr.Kolluru]; awaiting evaluation tomorrow.   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   #Poor IV access/Mediport placement- stable.   Schedule retacrit/venofer on separate days  # DISPOSITION:  # Venofer today;   # schedule tomorrow retacrit   # Venofer weekly x3 more  # every 2 weeks- labs- H&H- possible retacrit   # follow up 8 weeks- MD; port; labs- cbc/bmp; iron studies;ferritin; possible retacrit OR venofer- Dr.B

## 2022-02-01 NOTE — Progress Notes (Unsigned)
[facility] NOTE  Patient Care Team: Rusty Aus, MD as PCP - General (Internal Medicine) Josefine Class, MD as Referring Physician (Gastroenterology) Cammie Sickle, MD as Consulting Physician (Hematology and Oncology)  CHIEF COMPLAINTS/PURPOSE OF CONSULTATION: Anemia  HEMATOLOGY HISTORY  # ANEMIA- Jan 2021- 8.8/ferritin 11 [PCP]; N-WBC/platelets? IDA vs other- EGD-2015/colonoscopy-? 2015; 2020- [Dr.Skulskie] ; capsule-2016- ? 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   HISTORY OF PRESENTING ILLNESS: Ambulating independently.  Alone.  Cheryl Leonard 77 y.o.  female anemia iron deficiency-question CKD-III  here for follow-up.   Complains of fatigue.  No swelling of the legs.  No nausea no vomiting.  No blood in stools or black or stools.  Review of Systems  Constitutional:  Positive for malaise/fatigue. Negative for chills, diaphoresis and fever.  HENT:  Negative for nosebleeds and sore throat.   Eyes:  Negative for double vision.  Respiratory:  Positive for shortness of breath. Negative for cough, hemoptysis, sputum production and wheezing.   Cardiovascular:  Negative for chest pain, palpitations, orthopnea and leg swelling.  Gastrointestinal:  Negative for abdominal pain, blood in stool, constipation, diarrhea, heartburn, melena, nausea and vomiting.  Genitourinary:  Negative for dysuria, frequency and urgency.  Musculoskeletal:  Positive for joint pain.  Skin: Negative.  Negative for itching and rash.  Neurological:  Negative for dizziness, tingling, focal weakness, weakness and headaches.  Endo/Heme/Allergies:  Does not bruise/bleed easily.  Psychiatric/Behavioral:  Negative for depression. The patient is not nervous/anxious and does not have insomnia.      MEDICAL HISTORY:  Past Medical History:  Diagnosis Date   Anemia    Anxiety    Arthritis    Gout   Cataracts, both eyes    Diabetic retinopathy ([place])    NPDR OU   Diverticulitis    GERD (gastroesophageal reflux disease)    Gout    Headache    h/o migraines   History of fracture of patella    right knee   History of positive PPD    Patient always shows positive   Hyperlipidemia    Hypertension    Hypertensive retinopathy    OU   Hypothyroidism    Lichen sclerosus [id number]   of vulva   Metatarsal fracture    Neuropathy    Osteopenia    Peripheral vascular disease (HCC)    Polyneuropathy    numbness and tingling in feet and toes   Renal insufficiency    Stage 3   Sleep apnea    does not use cpap-lost weight    Type 2 diabetes mellitus, uncontrolled     SURGICAL HISTORY: Past Surgical History:  Procedure Laterality Date   ABDOMINAL HYSTERECTOMY     AMPUTATION TOE Right 05/08/2020   Procedure: AMPUTATION TOE-Right 4th Toe;  Surgeon: Caroline More, DPM;  Location: [facility];  Service: Podiatry;  Laterality: Right;   AMPUTATION TOE Left 04/22/2021   Procedure: AMPUTATION TOE - 4TH METARSOPHANGEAL JOINT;  Surgeon: Caroline More, DPM;  Location: [facility];  Service: Podiatry;  Laterality: Left;   APPENDECTOMY     BREAST REDUCTION SURGERY  2001   CATARACT EXTRACTION     CESAREAN SECTION  1976   COLONOSCOPY  03/05/2013   Nml - due for repeat 03/06/2018   COLONOSCOPY WITH PROPOFOL N/A 03/18/2019   Procedure:  COLONOSCOPY WITH PROPOFOL;  Surgeon: Toledo, Boykin Nearingeodoro K, MD;  Location: [facility];  Service: Gastroenterology;  Laterality: N/A;   DIAGNOSTIC LAPAROSCOPY     DILATION AND CURETTAGE OF UTERUS  1989   ENDARTERECTOMY FEMORAL Bilateral 03/09/2018   Procedure: ENDARTERECTOMY FEMORAL;  Surgeon: Annice Needyew, Jason S, MD;  Location: [facility];  Service: Vascular;  Laterality: Bilateral;   ENDARTERECTOMY POPLITEAL Left 03/09/2018   Procedure: ENDARTERECTOMY POPLITEAL AND SFA;   Surgeon: Annice Needyew, Jason S, MD;  Location: [facility];  Service: Vascular;  Laterality: Left;   ESOPHAGOGASTRODUODENOSCOPY  03/05/2013   ESOPHAGOGASTRODUODENOSCOPY (EGD) WITH PROPOFOL N/A 03/18/2019   Procedure: ESOPHAGOGASTRODUODENOSCOPY (EGD) WITH PROPOFOL;  Surgeon: Toledo, Boykin Nearingeodoro K, MD;  Location: [facility];  Service: Gastroenterology;  Laterality: N/A;   EYE SURGERY     Eyelid Surgery  2012   INTRAMEDULLARY (IM) NAIL INTERTROCHANTERIC Left 10/30/2015   Procedure: INTRAMEDULLARY (IM) NAIL INTERTROCHANTRIC ;  Surgeon: Kennedy BuckerMichael Menz, MD;  Location: [facility];  Service: Orthopedics;  Laterality: Left;   KYPHOPLASTY N/A 10/25/2018   Procedure: L4 KYPHOPLASTY;  Surgeon: Kennedy BuckerMenz, Michael, MD;  Location: [facility];  Service: Orthopedics;  Laterality: N/A;   LAPAROSCOPIC HYSTERECTOMY  2000   total   LOWER EXTREMITY ANGIOGRAPHY Left 03/08/2017   Procedure: LOWER EXTREMITY ANGIOGRAPHY;  Surgeon: Annice Needyew, Jason S, MD;  Location: [facility];  Service: Cardiovascular;  Laterality: Left;   LOWER EXTREMITY ANGIOGRAPHY Left 10/30/2017   Procedure: LOWER EXTREMITY ANGIOGRAPHY;  Surgeon: Annice Needyew, Jason S, MD;  Location: [facility];  Service: Cardiovascular;  Laterality: Left;   LOWER EXTREMITY ANGIOGRAPHY Right 03/08/2018   Procedure: LOWER EXTREMITY ANGIOGRAPHY;  Surgeon: Annice Needyew, Jason S, MD;  Location: [facility];  Service: Cardiovascular;  Laterality: Right;   LOWER EXTREMITY ANGIOGRAPHY Left 10/01/2018   Procedure: LOWER EXTREMITY ANGIOGRAPHY;  Surgeon: Annice Needyew, Jason S, MD;  Location: [facility];  Service: Cardiovascular;  Laterality: Left;   LOWER EXTREMITY ANGIOGRAPHY Right 10/08/2018   Procedure: LOWER EXTREMITY ANGIOGRAPHY;  Surgeon: Annice Needyew, Jason S, MD;  Location: [facility];  Service: Cardiovascular;  Laterality: Right;   LOWER EXTREMITY ANGIOGRAPHY Right 05/07/2020   Procedure: Lower Extremity Angiography;  Surgeon: Annice Needyew, Jason S, MD;  Location: [facility];  Service:  Cardiovascular;  Laterality: Right;   LYSIS OF ADHESION  01/25/2021   Procedure: LYSIS OF ADHESION;  Surgeon: Campbell Lernerodenberg, Denny, MD;  Location: [facility];  Service: General;;   PERIPHERAL VASCULAR INTERVENTION  03/08/2018   Procedure: PERIPHERAL VASCULAR INTERVENTION;  Surgeon: Annice Needyew, Jason S, MD;  Location: [facility];  Service: Cardiovascular;;   PORTA CATH INSERTION N/A 02/17/2020   Procedure: PORTA CATH INSERTION;  Surgeon: Annice Needyew, Jason S, MD;  Location: [facility];  Service: Cardiovascular;  Laterality: N/A;   REDUCTION MAMMAPLASTY  1997   SACROPLASTY N/A 10/25/2018   Procedure: S1 SACROPLASTY;  Surgeon: Kennedy BuckerMenz, Michael, MD;  Location: [facility];  Service: Orthopedics;  Laterality: N/A;    SOCIAL HISTORY: Social History   Socioeconomic History   Marital status: Married    Spouse name: [name]   Number of children: 3   Years of education: Not on file   Highest education level: Not on file  Occupational History   Occupation: Barrister's clerklibrary assistanty  Tobacco Use   Smoking status: Former    Packs/day: 1.00    Years: 20.00    Total pack years: 20.00    Types: Cigarettes    Quit date: 03/07/1996    Years since quitting:  25.9    Passive exposure: Past   Smokeless tobacco: Never   Tobacco comments:    started smoking at age 77 but stopped smoking in 2000  Vaping Use   Vaping Use: Never used  Substance and Sexual Activity   Alcohol use: No    Alcohol/week: 0.0 standard drinks of alcohol   Drug use: No   Sexual activity: Yes    Partners: Male    Birth control/protection: Surgical  Other Topics Concern   Not on file  Social History Narrative   [place] in [place]; with husband; quit > 20 years; no alcohol; used to work at [employer]  [place] [employer]    Social Determinants of Health   Financial Resource Strain: Low Risk  (10/08/2018)   Overall Financial Resource Strain (CARDIA)    Difficulty of Paying Living Expenses: Not very hard  Food Insecurity: No Food Insecurity  (10/08/2018)   Hunger Vital Sign    Worried About Running Out of Food in the Last Year: Never true    Ran Out of Food in the Last Year: Never true  Transportation Needs: Unknown (10/01/2018)   PRAPARE - Administrator, Civil ServiceTransportation    Lack of Transportation (Medical): No    Lack of Transportation (Non-Medical): Not on file  Physical Activity: Unknown (10/01/2018)   Exercise Vital Sign    Days of Exercise per Week: 2 days    Minutes of Exercise per Session: Not on file  Stress: Stress Concern Present (10/01/2018)   [employer] - Occupational Stress Questionnaire    Feeling of Stress : To some extent  Social Connections: Unknown (10/08/2018)   Social Connection and Isolation Panel [NHANES]    Frequency of Communication with Friends and Family: More than three times a week    Frequency of Social Gatherings with Friends and Family: Not on file    Attends Religious Services: Not on file    Active Member of Clubs or Organizations: Not on file    Attends BankerClub or Organization Meetings: Not on file    Marital Status: Married  Intimate Partner Violence: Not At Risk (10/08/2018)   Humiliation, Afraid, Rape, and Kick questionnaire    Fear of Current or Ex-Partner: No    Emotionally Abused: No    Physically Abused: No    Sexually Abused: No    FAMILY HISTORY: Family History  Problem Relation Age of Onset   Coronary artery disease Father    Heart attack Father    Coronary artery disease Mother    Heart attack Mother    Ovarian cancer Sister [id number]       sister had hormonal therapy for IVF txs-which increased risk factor for ovarian cancer   Breast cancer Neg Hx     ALLERGIES:  has No Known Allergies.  MEDICATIONS:  Current Outpatient Medications  Medication Sig Dispense Refill   ALPRAZolam (XANAX) 0.25 MG tablet Take 0.25 mg by mouth daily as needed for anxiety.     amLODipine (NORVASC) 5 MG tablet Take 5 mg by mouth 2 (two) times daily.     aspirin EC 81 MG tablet Take 1  tablet (81 mg total) by mouth daily. 30 tablet 11   cholecalciferol (VITAMIN D) 1000 units tablet Take 1,000 Units by mouth 2 (two) times daily.     dapagliflozin propanediol (FARXIGA) 5 MG TABS tablet Take 5 mg by mouth daily.     denosumab (PROLIA) 60 MG/ML SOLN injection Inject 60 mg into the skin every 6 (six) months.  escitalopram (LEXAPRO) 10 MG tablet Take 10 mg by mouth daily.     estradiol (ESTRACE) 0.1 MG/GM vaginal cream Place 1 Applicatorful vaginally daily as needed (vaginal irritation).     famotidine (PEPCID) 40 MG tablet Take 40 mg by mouth daily.     furosemide (LASIX) 20 MG tablet Take 20 mg by mouth every Wednesday.     gabapentin (NEURONTIN) 300 MG capsule Take 300 mg by mouth at bedtime as needed (pain).     HYDROcodone-acetaminophen (NORCO/VICODIN) 5-325 MG tablet Take 1 tablet by mouth daily.     levothyroxine (SYNTHROID, LEVOTHROID) 100 MCG tablet Take 100 mcg by mouth daily before breakfast.   3   lidocaine-prilocaine (EMLA) cream Apply 1 application topically as needed (apply prior to port a cath access). 30 g 3   Magnesium 500 MG TABS Take 500 mg by mouth every morning.      metoprolol succinate (TOPROL-XL) 50 MG 24 hr tablet Take 50 mg by mouth daily. Take with or immediately following a meal.     mirtazapine (REMERON) 15 MG tablet Take 15 mg by mouth as needed.     Multiple Vitamin (MULTIVITAMIN WITH MINERALS) TABS tablet Take 1 tablet by mouth daily. Centrum Silver     NOVOLOG FLEXPEN 100 UNIT/ML FlexPen SMARTSIG:8 Unit(s) SUB-Q Morning-Evening     nystatin cream (MYCOSTATIN) Apply 1 application topically daily as needed (Yeast infection).     olmesartan (BENICAR) 20 MG tablet Take 20 mg by mouth daily.     pantoprazole (PROTONIX) 40 MG tablet Take 40 mg by mouth daily.     pantoprazole sodium (PROTONIX) 40 mg Take 40 mg by mouth every morning.      rivaroxaban (XARELTO) 20 MG TABS tablet Take 1 tablet (20 mg total) by mouth daily with supper. 90 tablet 3    rosuvastatin (CRESTOR) 20 MG tablet Take 20 mg by mouth every morning.     TRESIBA FLEXTOUCH 200 UNIT/ML SOPN Inject 25-30 Units as directed at bedtime. Titrate according to fasting blood glucose not to exceed 50 units a day  5   vitamin B-12 (CYANOCOBALAMIN) 1000 MCG tablet Take 1,000 mcg by mouth daily.     vitamin C (ASCORBIC ACID) 250 MG tablet Take 250 mg by mouth daily.     vitamin E 400 UNIT capsule Take 400 Units by mouth daily.     Zinc Sulfate (ZINC 15 PO) Take 1 tablet by mouth daily.     zolpidem (AMBIEN) 10 MG tablet Take 10 mg by mouth at bedtime.     No current facility-administered medications for this visit.      PHYSICAL EXAMINATION:   Vitals:   02/01/22 1400  BP: 130/69  Pulse: 69  Resp: 18  Temp: (!) 97.3 F (36.3 C)   Filed Weights   02/01/22 1400  Weight: 166 lb (75.3 kg)    Physical Exam Constitutional:      Comments: Alone.  Ambulating independently.  HENT:     Head: Normocephalic and atraumatic.     Mouth/Throat:     Pharynx: No oropharyngeal exudate.  Eyes:     Pupils: Pupils are equal, round, and reactive to light.  Cardiovascular:     Rate and Rhythm: Normal rate and regular rhythm.  Pulmonary:     Effort: Pulmonary effort is normal. No respiratory distress.     Breath sounds: Normal breath sounds. No wheezing.  Abdominal:     General: Bowel sounds are normal. There is no distension.  Palpations: Abdomen is soft. There is no mass.     Tenderness: There is no abdominal tenderness. There is no guarding or rebound.  Musculoskeletal:        General: No tenderness. Normal range of motion.     Cervical back: Normal range of motion and neck supple.  Skin:    General: Skin is warm.  Neurological:     Mental Status: She is alert and oriented to person, place, and time.  Psychiatric:        Mood and Affect: Affect normal.     LABORATORY DATA:  I have reviewed the data as listed Lab Results  Component Value Date   WBC 5.9 02/01/2022    HGB 7.7 (L) 02/01/2022   HCT 24.2 (L) 02/01/2022   MCV 97.2 02/01/2022   PLT 286 02/01/2022   Recent Labs    11/16/21 1425 12/06/21 1051 01/18/22 1355  NA 136 139 135  K 4.8 4.7 4.9  CL 105 108 106  CO2 23 21* 20*  GLUCOSE 122* 136* 216*  BUN 40* 30* 42*  CREATININE 1.64* 1.62* 2.37*  CALCIUM 10.0 9.5 9.4  GFRNONAA 32* 33* 21*  PROT  --  6.9  --   ALBUMIN  --  3.9  --   AST  --  25  --   ALT  --  18  --   ALKPHOS  --  50  --   BILITOT  --  0.6  --      VAS US CAROTID  Result Date: 01/18/2022 Carotid Arterial Duplex Study Patient [facility]  [facility]  Date of Exam:   01/18/2022 Medical Rec #: [id number]            Accession #:    [id number] Date of Birth: 02/09/1945             Patient Gender: F Patient Age:   89 years Exam Location:  [facility] Procedure:      VAS US CAROTID Referring Phys: Festus BarrenJASON DEW --------------------------------------------------------------------------------  Indications:       Carotid artery disease. Comparison Study:  03/28/2018 Performing Technologist: Debbe BalesSolomon Mcclary RVS  Examination Guidelines: A complete evaluation includes B-mode imaging, spectral Doppler, color Doppler, and power Doppler as needed of all accessible portions of each vessel. Bilateral testing is considered an integral part of a complete examination. Limited examinations for reoccurring indications may be performed as noted.  Right Carotid Findings: +----------+--------+--------+--------+------------------+--------+           PSV cm/sEDV cm/sStenosisPlaque DescriptionComments +----------+--------+--------+--------+------------------+--------+ CCA Prox  79      14                                         +----------+--------+--------+--------+------------------+--------+ CCA Mid   93      16                                         +----------+--------+--------+--------+------------------+--------+ CCA Distal65      15                                          +----------+--------+--------+--------+------------------+--------+ ICA Prox  70      13                                         +----------+--------+--------+--------+------------------+--------+  ICA Mid   93      17                                         +----------+--------+--------+--------+------------------+--------+ ICA Distal100     29                                         +----------+--------+--------+--------+------------------+--------+ ECA       98      8                                          +----------+--------+--------+--------+------------------+--------+ +----------+--------+-------+--------+-------------------+           PSV cm/sEDV cmsDescribeArm Pressure (mmHG) +----------+--------+-------+--------+-------------------+ [id number]      0                                  +----------+--------+-------+--------+-------------------+ +---------+--------+--+--------+--+ VertebralPSV cm/s56EDV cm/s11 +---------+--------+--+--------+--+  Left Carotid Findings: +----------+--------+--------+--------+------------------+--------+           PSV cm/sEDV cm/sStenosisPlaque DescriptionComments +----------+--------+--------+--------+------------------+--------+ CCA Prox  106     16                                         +----------+--------+--------+--------+------------------+--------+ CCA Mid   101     20                                         +----------+--------+--------+--------+------------------+--------+ CCA Distal93      16                                         +----------+--------+--------+--------+------------------+--------+ ICA Prox  78      20                                         +----------+--------+--------+--------+------------------+--------+ ICA Mid   81      24                                         +----------+--------+--------+--------+------------------+--------+ ICA Distal101      27                                         +----------+--------+--------+--------+------------------+--------+ ECA       108     0                                          +----------+--------+--------+--------+------------------+--------+ +----------+--------+--------+--------+-------------------+  PSV cm/sEDV cm/sDescribeArm Pressure (mmHG) +----------+--------+--------+--------+-------------------+ [id number]     0                                   +----------+--------+--------+--------+-------------------+ +---------+--------+--+--------+--+ VertebralPSV cm/s70EDV cm/s15 +---------+--------+--+--------+--+   Summary: Right Carotid: Velocities in the right ICA are consistent with a 1-39% stenosis. Left Carotid: Velocities in the left ICA are consistent with a 1-39% stenosis. Vertebrals:  Bilateral vertebral arteries demonstrate antegrade flow. Subclavians: Normal flow hemodynamics were seen in bilateral subclavian              arteries. *See table(s) above for measurements and observations.  Electronically signed by Festus Barren MD on 01/18/2022 at 4:33:50 PM.    Final    VAS [place] ABI WITH/WO TBI  Result Date: 01/18/2022  LOWER EXTREMITY DOPPLER STUDY Patient Name:  Rosa Wyly Dura  Date of Exam:   01/18/2022 Medical Rec #: [id number]            Accession #:    [id number] Date of Birth: Nov 12, 1945             Patient Gender: F Patient Age:   3 years Exam Location:  [facility] Procedure:      VAS [place] ABI WITH/WO TBI Referring Phys: Festus Barren --------------------------------------------------------------------------------  Indications: Rest pain.  Vascular Interventions: On 03/08/2017 Aortogram - PTA of the left SFA with                         angioplasty/balloon. Stent placement to the left SFA.                         PTA of the left CIA with angioplasty/balloon.                         10/30/2017 PTA of left SFA and proximal popliteal                          artery. Left SFA and proximal popliteal artery stent.                          10/01/2018:Aortogram and Selective Left Lower Extremity                         Angiogram. Thrombolytic therapy TPA instilled in the                         Left SFA and Popliteal Artery. Mechanical Thrombectomy                         to the Left SFA and Popliteal Artery. PTA of the Left                         External Iliac Artery and CFA. PTA of the Left SFA and                         Proximal Popliteal Artery. Stent placement to the                         Popliteal Artery and Distal  SFA. Stent placed to the                         Left SFA.                          10/08/2018: Aortogram and Selctive Right Lower Extremity                         Angiogram. Mechanical Thrombectomy of the Right SFA,                         Popliteal Artery and Posterior tibial Artery. PTA of the                         Right SFA and Proximal Popliteal Artery. Stent placement                         to the Distal SFAand Proximal Popliteal Artery. Stent                         placement to the Right SFA. PTA of the Right Posterior                         Tibial Artery. placement. Comparison Study: 10/21/2021 Performing Technologist: Almira Coaster RVS  Examination Guidelines: A complete evaluation includes at minimum, Doppler waveform signals and systolic blood pressure reading at the level of bilateral brachial, anterior tibial, and posterior tibial arteries, when vessel segments are accessible. Bilateral testing is considered an integral part of a complete examination. Photoelectric Plethysmograph (PPG) waveforms and toe systolic pressure readings are included as required and additional duplex testing as needed. Limited examinations for reoccurring indications may be performed as noted.  ABI Findings: +---------+------------------+-----+---------+--------+ Right    Rt Pressure (mmHg)IndexWaveform Comment   +---------+------------------+-----+---------+--------+ Brachial 120                                      +---------+------------------+-----+---------+--------+ ATA      128               0.98 triphasic         +---------+------------------+-----+---------+--------+ PTA      132               1.01 triphasic         +---------+------------------+-----+---------+--------+ Great Toe114               0.87 Normal            +---------+------------------+-----+---------+--------+ +---------+------------------+-----+---------+-------+ Left     Lt Pressure (mmHg)IndexWaveform Comment +---------+------------------+-----+---------+-------+ Brachial 131                                     +---------+------------------+-----+---------+-------+ ATA      121               0.92 triphasic        +---------+------------------+-----+---------+-------+ PTA      132               1.01 triphasic        +---------+------------------+-----+---------+-------+ Loyal Gambler  0.84 Normal           +---------+------------------+-----+---------+-------+ +-------+-----------+-----------+------------+------------+ ABI/TBIToday's ABIToday's TBIPrevious ABIPrevious TBI +-------+-----------+-----------+------------+------------+ Right  1.01       .87        1.13        .72          +-------+-----------+-----------+------------+------------+ Left   1.01       .84        1.15        .86          +-------+-----------+-----------+------------+------------+ Bilateral ABIs appear essentially unchanged compared to prior study on 10/21/2021. Left TBIs appear essentially unchanged compared to prior study on 10/21/2021. Right ZTBIs appear to be increased compared to prior study on 10/21/2021.  Summary: Right: Resting right ankle-brachial index is within normal range. The right toe-brachial index is normal. Left: Resting left ankle-brachial index is within normal range. The  left toe-brachial index is normal. *See table(s) above for measurements and observations.  Electronically signed by Leotis Pain MD on 01/18/2022 at 4:33:31 PM.    Final    Intravitreal Injection, Pharmacologic Agent - OD - Right Eye  Result Date: 01/05/2022 Time Out 01/05/2022. 3:17 PM. Confirmed correct patient, procedure, site, and patient consented. Anesthesia Topical anesthesia was used. Anesthetic medications included Lidocaine 2%, Proparacaine 0.5%. Procedure Preparation included 5% betadine to ocular surface, eyelid speculum. A (32 g) needle was used. Injection: 6 mg faricimab-svoa 6 MG/0.05ML   Route: Intravitreal, Site: Right Eye   NDC: [phone], Lot: [id number], Expiration date: 01/03/2024, Waste: 0 mL Post-op Post injection exam found visual acuity of at least counting fingers. The patient tolerated the procedure well. There were no complications. The patient received written and verbal post procedure care education. Post injection medications were not given.   OCT, Retina - OU - Both Eyes  Result Date: 01/05/2022 Right Eye Quality was good. Central Foveal Thickness: 345. Progression has improved. Findings include no SRF, abnormal foveal contour, epiretinal membrane, intraretinal fluid (Interval improvement in IRF and foveal contour, just trace cystic changes remain). Left Eye Quality was good. Central Foveal Thickness: 292. Progression has been stable. Findings include normal foveal contour, no IRF, no SRF (Trace ERM). Notes *Images captured and stored on drive Diagnosis / Impression: OD: BRVO w/ CME - Interval improvement in IRF and foveal contour, just trace cystic changes remain OS: NFP; no IRF/SRF--stable, trace ERM Clinical management: See below Abbreviations: NFP - Normal foveal profile. CME - cystoid macular edema. PED - pigment epithelial detachment. IRF - intraretinal fluid. SRF - subretinal fluid. EZ - ellipsoid zone. ERM - epiretinal membrane. ORA - outer retinal atrophy. ORT - outer  retinal tubulation. 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  If Retacrit/iron does not improve consider bone marrow biopsy.  # JAN 2024- Ferritin- 24; I sat-23-hemoglobin 7.7 proceed with venofer weekly x4. Proceed with venofer today.  Retacrit tomorrow.   # Etiology-CKD-III--IV; GFR ~21 overall worsened [Dr.Kolluru]; awaiting evaluation tomorrow.   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   #Poor IV access/Mediport placement- stable.   Schedule retacrit/venofer on separate days  # DISPOSITION:  # Venofer today;   # schedule tomorrow retacrit   # Venofer weekly x3 more  # every 2 weeks- labs- H&H- possible retacrit   # follow up 8 weeks- MD; port; labs- cbc/bmp; iron studies;ferritin; possible retacrit OR venofer- Dr.B  All questions were answered. The patient knows to call the clinic with any problems, questions or concerns.    Earna Coder, MD 02/01/2022 2:31 PM

## 2022-02-01 NOTE — Progress Notes (Unsigned)
Patient denies new problems/concerns today.   °

## 2022-02-02 ENCOUNTER — Inpatient Hospital Stay: Payer: Medicare Other

## 2022-02-02 ENCOUNTER — Ambulatory Visit (INDEPENDENT_AMBULATORY_CARE_PROVIDER_SITE_OTHER): Payer: Medicare Other | Admitting: Ophthalmology

## 2022-02-02 ENCOUNTER — Encounter (INDEPENDENT_AMBULATORY_CARE_PROVIDER_SITE_OTHER): Payer: Self-pay | Admitting: Ophthalmology

## 2022-02-02 VITALS — BP 120/42 | HR 72

## 2022-02-02 DIAGNOSIS — H35033 Hypertensive retinopathy, bilateral: Secondary | ICD-10-CM

## 2022-02-02 DIAGNOSIS — N183 Chronic kidney disease, stage 3 unspecified: Secondary | ICD-10-CM

## 2022-02-02 DIAGNOSIS — Z961 Presence of intraocular lens: Secondary | ICD-10-CM

## 2022-02-02 DIAGNOSIS — E113213 Type 2 diabetes mellitus with mild nonproliferative diabetic retinopathy with macular edema, bilateral: Secondary | ICD-10-CM

## 2022-02-02 DIAGNOSIS — H34831 Tributary (branch) retinal vein occlusion, right eye, with macular edema: Secondary | ICD-10-CM

## 2022-02-02 DIAGNOSIS — I10 Essential (primary) hypertension: Secondary | ICD-10-CM

## 2022-02-02 DIAGNOSIS — I129 Hypertensive chronic kidney disease with stage 1 through stage 4 chronic kidney disease, or unspecified chronic kidney disease: Secondary | ICD-10-CM | POA: Diagnosis not present

## 2022-02-02 DIAGNOSIS — H35371 Puckering of macula, right eye: Secondary | ICD-10-CM

## 2022-02-02 MED ORDER — FARICIMAB-SVOA 6 MG/0.05ML IZ SOLN
6.0000 mg | INTRAVITREAL | Status: AC | PRN
  Administered 2022-02-02: 6 mg via INTRAVITREAL

## 2022-02-02 MED ORDER — EPOETIN ALFA-EPBX 10000 UNIT/ML IJ SOLN
20000.0000 [IU] | Freq: Once | INTRAMUSCULAR | Status: AC
  Administered 2022-02-02: 20000 [IU] via SUBCUTANEOUS
  Filled 2022-02-02: qty 2

## 2022-02-03 ENCOUNTER — Encounter: Payer: Self-pay | Admitting: Internal Medicine

## 2022-02-07 MED FILL — Iron Sucrose Inj 20 MG/ML (Fe Equiv): INTRAVENOUS | Qty: 10 | Status: AC

## 2022-02-08 ENCOUNTER — Inpatient Hospital Stay: Payer: Medicare Other | Attending: Internal Medicine

## 2022-02-08 VITALS — BP 124/49 | HR 72 | Temp 96.9°F | Resp 16

## 2022-02-08 DIAGNOSIS — I129 Hypertensive chronic kidney disease with stage 1 through stage 4 chronic kidney disease, or unspecified chronic kidney disease: Secondary | ICD-10-CM | POA: Diagnosis present

## 2022-02-08 DIAGNOSIS — Z79899 Other long term (current) drug therapy: Secondary | ICD-10-CM | POA: Diagnosis not present

## 2022-02-08 DIAGNOSIS — N183 Chronic kidney disease, stage 3 unspecified: Secondary | ICD-10-CM | POA: Diagnosis present

## 2022-02-08 DIAGNOSIS — D631 Anemia in chronic kidney disease: Secondary | ICD-10-CM | POA: Diagnosis present

## 2022-02-08 MED ORDER — SODIUM CHLORIDE 0.9 % IV SOLN
200.0000 mg | Freq: Once | INTRAVENOUS | Status: AC
  Administered 2022-02-08: 200 mg via INTRAVENOUS
  Filled 2022-02-08: qty 200

## 2022-02-08 MED ORDER — SODIUM CHLORIDE 0.9 % IV SOLN
Freq: Once | INTRAVENOUS | Status: AC
  Filled 2022-02-08: qty 250

## 2022-02-08 MED ORDER — HEPARIN SOD (PORK) LOCK FLUSH 100 UNIT/ML IV SOLN
500.0000 [IU] | Freq: Once | INTRAVENOUS | Status: AC | PRN
  Administered 2022-02-08: 500 [IU]
  Filled 2022-02-08: qty 5

## 2022-02-14 MED FILL — Iron Sucrose Inj 20 MG/ML (Fe Equiv): INTRAVENOUS | Qty: 10 | Status: AC

## 2022-02-15 ENCOUNTER — Inpatient Hospital Stay: Payer: Medicare Other

## 2022-02-15 VITALS — BP 136/44

## 2022-02-15 DIAGNOSIS — N183 Chronic kidney disease, stage 3 unspecified: Secondary | ICD-10-CM | POA: Diagnosis not present

## 2022-02-15 DIAGNOSIS — D631 Anemia in chronic kidney disease: Secondary | ICD-10-CM

## 2022-02-15 DIAGNOSIS — D649 Anemia, unspecified: Secondary | ICD-10-CM

## 2022-02-15 LAB — HEMOGLOBIN AND HEMATOCRIT, BLOOD
HCT: 27.8 % — ABNORMAL LOW (ref 36.0–46.0)
Hemoglobin: 8.6 g/dL — ABNORMAL LOW (ref 12.0–15.0)

## 2022-02-15 MED ORDER — EPOETIN ALFA-EPBX 10000 UNIT/ML IJ SOLN
20000.0000 [IU] | Freq: Once | INTRAMUSCULAR | Status: AC
  Administered 2022-02-15: 20000 [IU] via SUBCUTANEOUS
  Filled 2022-02-15: qty 2

## 2022-02-15 MED FILL — Iron Sucrose Inj 20 MG/ML (Fe Equiv): INTRAVENOUS | Qty: 10 | Status: AC

## 2022-02-16 ENCOUNTER — Inpatient Hospital Stay: Payer: Medicare Other

## 2022-02-22 ENCOUNTER — Inpatient Hospital Stay: Payer: Medicare Other

## 2022-02-22 VITALS — BP 119/52 | HR 66 | Temp 98.7°F | Resp 16

## 2022-02-22 DIAGNOSIS — N183 Chronic kidney disease, stage 3 unspecified: Secondary | ICD-10-CM

## 2022-02-22 DIAGNOSIS — D631 Anemia in chronic kidney disease: Secondary | ICD-10-CM

## 2022-02-22 MED ORDER — SODIUM CHLORIDE 0.9 % IV SOLN
200.0000 mg | Freq: Once | INTRAVENOUS | Status: AC
  Administered 2022-02-22: 200 mg via INTRAVENOUS
  Filled 2022-02-22: qty 200

## 2022-02-22 MED ORDER — HEPARIN SOD (PORK) LOCK FLUSH 100 UNIT/ML IV SOLN
500.0000 [IU] | Freq: Once | INTRAVENOUS | Status: AC | PRN
  Administered 2022-02-22: 500 [IU]
  Filled 2022-02-22: qty 5

## 2022-02-22 MED ORDER — SODIUM CHLORIDE 0.9% FLUSH
10.0000 mL | Freq: Once | INTRAVENOUS | Status: AC | PRN
  Administered 2022-02-22: 10 mL
  Filled 2022-02-22: qty 10

## 2022-02-22 MED ORDER — SODIUM CHLORIDE 0.9 % IV SOLN
Freq: Once | INTRAVENOUS | Status: AC
  Filled 2022-02-22: qty 250

## 2022-02-22 NOTE — Patient Instructions (Signed)

## 2022-02-28 MED FILL — Iron Sucrose Inj 20 MG/ML (Fe Equiv): INTRAVENOUS | Qty: 10 | Status: AC

## 2022-02-28 NOTE — Progress Notes (Signed)
[facility] Note  03/02/2022     CHIEF COMPLAINT Patient presents for Retina Follow Up   HISTORY OF PRESENT ILLNESS: Cheryl Leonard is a 77 y.o. female who presents to the clinic today for:    HPI     Retina Follow Up   Patient presents with  CRVO/BRVO.  In right eye.  This started 4 weeks ago.  I, the attending physician,  performed the HPI with the patient and updated documentation appropriately.        Comments   Patient here for 4 weeks retina follow up for BRVO OD. Patient states vision doing the same. No eye pain.      Last edited by Bernarda Caffey, MD on 03/02/2022  3:58 PM.    Pt states vision is stable   Referring physician: Rusty Aus, [place],  [place] [id number]   HISTORICAL INFORMATION:   Selected notes from the MEDICAL RECORD NUMBER Referred by Dr. Marvel Plan for concern of DME OD Lab Results  Component Value Date   HGBA1C 7.3 (H) 01/11/2021     CURRENT MEDICATIONS: No current outpatient medications on file. (Ophthalmic Drugs)   No current facility-administered medications for this visit. (Ophthalmic Drugs)   Current Outpatient Medications (Other)  Medication Sig   ALPRAZolam (XANAX) 0.25 MG tablet Take 0.25 mg by mouth daily as needed for anxiety.   amLODipine (NORVASC) 5 MG tablet Take 5 mg by mouth 2 (two) times daily.   aspirin EC 81 MG tablet Take 1 tablet (81 mg total) by mouth daily.   cholecalciferol (VITAMIN D) 1000 units tablet Take 1,000 Units by mouth 2 (two) times daily.   dapagliflozin propanediol (FARXIGA) 5 MG TABS tablet Take 5 mg by mouth daily.   denosumab (PROLIA) 60 MG/ML SOLN injection Inject 60 mg into the skin every 6 (six) months.    escitalopram (LEXAPRO) 10 MG tablet Take 10 mg by mouth daily.   estradiol (ESTRACE) 0.1 MG/GM vaginal cream Place 1 Applicatorful vaginally daily as needed (vaginal irritation).   famotidine  (PEPCID) 40 MG tablet Take 40 mg by mouth daily.   furosemide (LASIX) 20 MG tablet Take 20 mg by mouth every Wednesday.   gabapentin (NEURONTIN) 300 MG capsule Take 300 mg by mouth at bedtime as needed (pain).   HYDROcodone-acetaminophen (NORCO/VICODIN) 5-325 MG tablet Take 1 tablet by mouth daily.   levothyroxine (SYNTHROID, LEVOTHROID) 100 MCG tablet Take 100 mcg by mouth daily before breakfast.    lidocaine-prilocaine (EMLA) cream Apply 1 application topically as needed (apply prior to port a cath access).   Magnesium 500 MG TABS Take 500 mg by mouth every morning.    metoprolol succinate (TOPROL-XL) 50 MG 24 hr tablet Take 50 mg by mouth daily. Take with or immediately following a meal.   mirtazapine (REMERON) 15 MG tablet Take 15 mg by mouth as needed.   Multiple Vitamin (MULTIVITAMIN WITH MINERALS) TABS tablet Take 1 tablet by mouth daily. Centrum Silver   NOVOLOG FLEXPEN 100 UNIT/ML FlexPen SMARTSIG:8 Unit(s) SUB-Q Morning-Evening   nystatin cream (MYCOSTATIN) Apply 1 application topically daily as needed (Yeast infection).   olmesartan (BENICAR) 20 MG tablet Take 20 mg by mouth daily.   pantoprazole (PROTONIX) 40 MG tablet Take 40 mg by mouth daily.   pantoprazole sodium (PROTONIX) 40 mg Take 40 mg by mouth every morning.    rivaroxaban (XARELTO) 20 MG TABS tablet Take 1 tablet (20 mg  total) by mouth daily with supper.   rosuvastatin (CRESTOR) 20 MG tablet Take 20 mg by mouth every morning.   TRESIBA FLEXTOUCH 200 UNIT/ML SOPN Inject 25-30 Units as directed at bedtime. Titrate according to fasting blood glucose not to exceed 50 units a day   vitamin B-12 (CYANOCOBALAMIN) 1000 MCG tablet Take 1,000 mcg by mouth daily.   vitamin C (ASCORBIC ACID) 250 MG tablet Take 250 mg by mouth daily.   vitamin E 400 UNIT capsule Take 400 Units by mouth daily.   Zinc Sulfate (ZINC 15 PO) Take 1 tablet by mouth daily.   zolpidem (AMBIEN) 10 MG tablet Take 10 mg by mouth at bedtime.   No current  facility-administered medications for this visit. (Other)   REVIEW OF SYSTEMS: ROS   Positive for: Gastrointestinal, Musculoskeletal, Endocrine, Cardiovascular, Eyes, Respiratory Negative for: Constitutional, Neurological, Skin, Genitourinary, HENT, Psychiatric, Allergic/Imm, Heme/Lymph Last edited by Theodore Demark, COA on 03/02/2022  2:21 PM.     ALLERGIES No Known Allergies  PAST MEDICAL HISTORY Past Medical History:  Diagnosis Date   Anemia    Anxiety    Arthritis    Gout   Cataracts, both eyes    Diabetic retinopathy ([place])    NPDR OU   Diverticulitis    GERD (gastroesophageal reflux disease)    Gout    Headache    h/o migraines   History of fracture of patella    right knee   History of positive PPD    Patient always shows positive   Hyperlipidemia    Hypertension    Hypertensive retinopathy    OU   Hypothyroidism    Lichen sclerosus [id number]   of vulva   Metatarsal fracture    Neuropathy    Osteopenia    Peripheral vascular disease (HCC)    Polyneuropathy    numbness and tingling in feet and toes   Renal insufficiency    Stage 3   Sleep apnea    does not use cpap-lost weight    Type 2 diabetes mellitus, uncontrolled    Past Surgical History:  Procedure Laterality Date   ABDOMINAL HYSTERECTOMY     AMPUTATION TOE Right 05/08/2020   Procedure: AMPUTATION TOE-Right 4th Toe;  Surgeon: Caroline More, DPM;  Location: [facility];  Service: Podiatry;  Laterality: Right;   AMPUTATION TOE Left 04/22/2021   Procedure: AMPUTATION TOE - 4TH METARSOPHANGEAL JOINT;  Surgeon: Caroline More, DPM;  Location: [facility];  Service: Podiatry;  Laterality: Left;   APPENDECTOMY     BREAST REDUCTION SURGERY  2001   CATARACT EXTRACTION     CESAREAN SECTION  1976   COLONOSCOPY  03/05/2013   Nml - due for repeat 03/06/2018   COLONOSCOPY WITH PROPOFOL N/A 03/18/2019   Procedure: COLONOSCOPY WITH PROPOFOL;  Surgeon: Toledo, Benay Pike, MD;  Location: [facility];  Service:  Gastroenterology;  Laterality: N/A;   DIAGNOSTIC LAPAROSCOPY     DILATION AND CURETTAGE OF UTERUS  1989   ENDARTERECTOMY FEMORAL Bilateral 03/09/2018   Procedure: ENDARTERECTOMY FEMORAL;  Surgeon: Algernon Huxley, MD;  Location: [facility];  Service: Vascular;  Laterality: Bilateral;   ENDARTERECTOMY POPLITEAL Left 03/09/2018   Procedure: ENDARTERECTOMY POPLITEAL AND SFA;  Surgeon: Algernon Huxley, MD;  Location: [facility];  Service: Vascular;  Laterality: Left;   ESOPHAGOGASTRODUODENOSCOPY  03/05/2013   ESOPHAGOGASTRODUODENOSCOPY (EGD) WITH PROPOFOL N/A 03/18/2019   Procedure: ESOPHAGOGASTRODUODENOSCOPY (EGD) WITH PROPOFOL;  Surgeon: Toledo, Benay Pike, MD;  Location: [facility];  Service: Gastroenterology;  Laterality:  N/A;   EYE SURGERY     Eyelid Surgery  2012   INTRAMEDULLARY (IM) NAIL INTERTROCHANTERIC Left 10/30/2015   Procedure: INTRAMEDULLARY (IM) NAIL INTERTROCHANTRIC ;  Surgeon: Hessie Knows, MD;  Location: [facility];  Service: Orthopedics;  Laterality: Left;   KYPHOPLASTY N/A 10/25/2018   Procedure: L4 KYPHOPLASTY;  Surgeon: Hessie Knows, MD;  Location: [facility];  Service: Orthopedics;  Laterality: N/A;   LAPAROSCOPIC HYSTERECTOMY  2000   total   LOWER EXTREMITY ANGIOGRAPHY Left 03/08/2017   Procedure: LOWER EXTREMITY ANGIOGRAPHY;  Surgeon: Algernon Huxley, MD;  Location: [place] [facility];  Service: Cardiovascular;  Laterality: Left;   LOWER EXTREMITY ANGIOGRAPHY Left 10/30/2017   Procedure: LOWER EXTREMITY ANGIOGRAPHY;  Surgeon: Algernon Huxley, MD;  Location: [place] [facility];  Service: Cardiovascular;  Laterality: Left;   LOWER EXTREMITY ANGIOGRAPHY Right 03/08/2018   Procedure: LOWER EXTREMITY ANGIOGRAPHY;  Surgeon: Algernon Huxley, MD;  Location: [place] [facility];  Service: Cardiovascular;  Laterality: Right;   LOWER EXTREMITY ANGIOGRAPHY Left 10/01/2018   Procedure: LOWER EXTREMITY ANGIOGRAPHY;  Surgeon: Algernon Huxley, MD;  Location: [place] [facility];  Service: Cardiovascular;   Laterality: Left;   LOWER EXTREMITY ANGIOGRAPHY Right 10/08/2018   Procedure: LOWER EXTREMITY ANGIOGRAPHY;  Surgeon: Algernon Huxley, MD;  Location: [place] [facility];  Service: Cardiovascular;  Laterality: Right;   LOWER EXTREMITY ANGIOGRAPHY Right 05/07/2020   Procedure: Lower Extremity Angiography;  Surgeon: Algernon Huxley, MD;  Location: [place] [facility];  Service: Cardiovascular;  Laterality: Right;   LYSIS OF ADHESION  01/25/2021   Procedure: LYSIS OF ADHESION;  Surgeon: Ronny Bacon, MD;  Location: [facility];  Service: General;;   PERIPHERAL VASCULAR INTERVENTION  03/08/2018   Procedure: PERIPHERAL VASCULAR INTERVENTION;  Surgeon: Algernon Huxley, MD;  Location: [place] [facility];  Service: Cardiovascular;;   PORTA CATH INSERTION N/A 02/17/2020   Procedure: PORTA CATH INSERTION;  Surgeon: Algernon Huxley, MD;  Location: [place] [facility];  Service: Cardiovascular;  Laterality: N/A;   REDUCTION MAMMAPLASTY  1997   SACROPLASTY N/A 10/25/2018   Procedure: S1 SACROPLASTY;  Surgeon: Hessie Knows, MD;  Location: [facility];  Service: Orthopedics;  Laterality: N/A;   FAMILY HISTORY Family History  Problem Relation Age of Onset   Coronary artery disease Father    Heart attack Father    Coronary artery disease Mother    Heart attack Mother    Ovarian cancer Sister 70       sister had hormonal therapy for IVF txs-which increased risk factor for ovarian cancer   Breast cancer Neg Hx    SOCIAL HISTORY Social History   Tobacco Use   Smoking status: Former    Packs/day: 1.00    Years: 20.00    Total pack years: 20.00    Types: Cigarettes    Quit date: 03/07/1996    Years since quitting: 26.0    Passive exposure: Past   Smokeless tobacco: Never   Tobacco comments:    started smoking at age 85 but stopped smoking in 2000  Vaping Use   Vaping Use: Never used  Substance Use Topics   Alcohol use: No    Alcohol/week: 0.0 standard drinks of alcohol   Drug use: No       OPHTHALMIC  EXAM: Base Eye Exam     Visual Acuity (Snellen - Linear)       Right Left   Dist [place] 20/30 20/20   Dist ph [place]  NI          Tonometry (Tonopen, 2:19 PM)       Right Left   Pressure 15 15         Pupils       Dark Light Shape React APD   Right 3 2 Round Brisk None   Left 3 2 Round Brisk None         Visual Fields (Counting fingers)       Left Right    Full Full         Extraocular Movement       Right Left    Full, Ortho Full, Ortho         Neuro/Psych     Oriented x3: Yes   Mood/Affect: Normal         Dilation     Both eyes: 1.0% Mydriacyl, 2.5% Phenylephrine @ 2:19 PM           Slit Lamp and Fundus Exam     External Exam       Right Left   External Normal Normal         Slit Lamp Exam       Right Left   Lids/Lashes dermatochalasis dermatochalasis   Conjunctiva/Sclera White and quiet White and quiet   Cornea arcus; well healed cataract wound; 2-3+ diffuse Punctate epithelial erosions, decreased TBUT, mild Anterior basement membrane dystrophy superiorly arcus; well healed cataract wound, 2-3+ diffuse Punctate epithelial erosions, irregualr epi surface, decreased TBUT   Anterior Chamber Deep and quiet Deep and quiet   Iris Round and dilated Round and dilated   Lens PCIOL; open PC PCIOL; open PC   Anterior Vitreous syneresis, Posterior vitreous detachment, vitreous condensations inferiorly syneresis, Posterior vitreous detachment         Fundus Exam       Right Left   Disc Superior hyperemia, mild Pallor Pink and Sharp   C/D Ratio 0.6 0.5   Macula Flat, Blunted foveal reflex, +cystic changes - slightly increased, +Epiretinal membrane, minimal MA flat; good foveal reflex, no heme or edema, small pigment clump IT to fovea   Vessels attenuated, Tortuous attenuated, Tortuous   Periphery Attached; scattered DBH -- greastest temporal periphery Attached, no heme           IMAGING AND PROCEDURES  Imaging and Procedures for  04/25/17  OCT, Retina - OU - Both Eyes       Right Eye Quality was good. Central Foveal Thickness: 346. Progression has worsened. Findings include no SRF, abnormal foveal contour, epiretinal membrane, intraretinal fluid (Mild Interval increase in IRF superior fovea and macula).   Left Eye Quality was good. Central Foveal Thickness: 282. Progression has been stable. Findings include normal foveal contour, no IRF, no SRF.   Notes *Images captured and stored on drive  Diagnosis / Impression:  OD: BRVO w/ CME - Mild Interval increase in IRF superior fovea and macula OS: NFP; no IRF/SRF--stable  Clinical management:  See below  Abbreviations: NFP - Normal foveal profile. CME - cystoid macular edema. PED - pigment epithelial detachment. IRF - intraretinal fluid. SRF - subretinal fluid. EZ - ellipsoid zone. ERM - epiretinal membrane. ORA - outer retinal atrophy. ORT - outer retinal tubulation. SRHM - subretinal hyper-reflective material      Intravitreal Injection, Pharmacologic Agent - OD - Right Eye       Time Out 03/02/2022. 3:10 PM. Confirmed correct patient, procedure, site, and patient consented.   Anesthesia Topical anesthesia was  used. Anesthetic medications included Lidocaine 2%, Proparacaine 0.5%.   Procedure Preparation included 5% betadine to ocular surface, eyelid speculum. A (32 g) needle was used.   Injection: 6 mg faricimab-svoa 6 MG/0.05ML   Route: Intravitreal, Site: Right Eye   NDC: [id number], Lot: [id number], Expiration date: 02/03/2024, Waste: 0 mL   Post-op Post injection exam found visual acuity of at least counting fingers. The patient tolerated the procedure well. There were no complications. The patient received written and verbal post procedure care education. Post injection medications were not given.            ASSESSMENT/PLAN:    ICD-10-CM   1. Branch retinal vein occlusion of right eye with macular edema  H34.8310 OCT, Retina - OU - Both  Eyes    Intravitreal Injection, Pharmacologic Agent - OD - Right Eye    faricimab-svoa (VABYSMO) '6mg'$ /0.69m intravitreal injection    2. Both eyes affected by mild nonproliferative diabetic retinopathy with macular edema, associated with type 2 diabetes mellitus ([place]  [id number]    3. Essential hypertension  I10     4. Hypertensive retinopathy of both eyes  H35.033     5. Epiretinal membrane (ERM) of right eye  H35.371     6. Pseudophakia of both eyes  Z96.1      1. BRVO w/ CME OD  - by history, pt states symptoms first noticed 2 wks prior to presentation, but reports changes may have occurred prior  - initial exam with differential tortuosity of vessels (OD > OS)  - FA (02.10.20) shows mild late staining / leakage in macula, staining / leakage of disc -- improving CME  - differential includes DM2 (DME), hypertensive retinopathy, inflammatory etiology / uveitis  - S/P IVA OD #1 (02.08.19), #2 (03.11.19), #3 (04.09.19), #4 (05.20.19), #5 (02.10.20)  - gave IVA OD on 2.10.20 due to pending Eylea4U for 2020 -- resulted in increased IRF/CME  - review of OCTs show persistent IRF and cystic changes --  resistance to IVA   - June 2019 -- switched therapies: S/P IVE OD #1 (06.24.19), #2 (07.24.19), #3 (09.04.19), #4 (10.30.19),#5 (12.30.19), #6 (03.23.20), #7 (05.05.20), #8 (07.16.20), #9 (07.17.20), #10 (08.28.20), #11 (10.13.20), # 12 (11.17.20), #13 (2.8.21), #14 (03.09.21), #15 (04.13.21), #16 (05.11.21), #17 (06.17.21), #18 (07.23.21), #19 (08.30.21), #20 (10.04.21), #21 (11.08.21), #22 (12.08.21), #23 (01.31.22), #24 (02.28.22), #25 (04.01.22), #26 (06.15.22), #27 (07.13.22), #28 (08.17.22), #29 (09.21.22), #30 (10.19.22), #31 (11.16.22), #32 (12.16.22), #33 (01.13.23), #34 (02.10.23), #35 (03.15.23), #36 (04.14.23), #37 (05.15.23), #38 (06.12.23), #39 (07.10.23), #40 (08.07.23), #41 (09.06.23), #42 (10.04.23), #43 (11.01.23) -- IVE resistance  - s/p IVV OD #1 (12.04.23), #2 (01.03.24), #3  (01.31.24)  - OCT today shows Mild Interval increase in IRF superior fovea and macula  - BCVA 20/30 -- stable  - recommend IVV OD #4 today, 02.28.24 w/ f/u in 4 weeks  - RBA of procedure discussed, questions answered  - informed consent obtained  - see procedure note   - Vabysmo informed consent form obtained, signed and scanned on 12.04.23  - Eylea informed consent form obtained, re-signed and scanned on 05.15.23  - Eylea4U benefits investigation completed and pt approved for IVE for 2023  - f/u 4 weeks  -- DFE/OCT/possible injection  2. Mild nonproliferative diabetic retinopathy, both eyes  - A1c 6.2 on 09.12.23; 6.6 on 07.01.23; 6.0 on 10.14.22  - could be contributing to CME OD  - OS with minimal diabetic retinopathy  - continue to monitor  3,4. Hypertensive retinopathy OU -  stable  - as above, may have contributing to CME OD  - discussed importance of tight BP control  - monitor  5. Epiretinal membrane, right eye   - stable nasal ERM  - no indication for surgery at this time  6. Pseudophakia OU  - s/p CE/IOL OU by cataract surgeon in [facility]  - doing well  - monitor  Ophthalmic Meds Ordered this visit:  Meds ordered this encounter  Medications   faricimab-svoa (VABYSMO) '6mg'$ /0.42m intravitreal injection     Return in about 4 weeks (around 03/30/2022) for f/u BRVO OD, DFE, OCT.  This document serves as a record of services personally performed by BGardiner Sleeper MD, PhD. It was created on their behalf by MOrvan Falconer an ophthalmic technician. The creation of this record is the provider's dictation and/or activities during the visit.    Electronically signed by: MOrvan Falconer OA, 03/02/22  3:59 PM  This document serves as a record of services personally performed by BGardiner Sleeper MD, PhD. It was created on their behalf by ASan Jetty BOwens Shark OA an ophthalmic technician. The creation of this record is the provider's dictation and/or activities during the visit.     Electronically signed by: ASan Jetty BOwens Shark ONew [place]02.28.2024 3:59 PM  BGardiner Sleeper M.D., Ph.D. Diseases & Surgery of the Retina and Vitreous [facility] I have reviewed the above documentation for accuracy and completeness, and I agree with the above. BGardiner Sleeper M.D., Ph.D. 03/02/22 4:00 PM   Abbreviations: M myopia (nearsighted); A astigmatism; H hyperopia (farsighted); P presbyopia; Mrx spectacle prescription;  CTL contact lenses; OD right eye; OS left eye; OU both eyes  XT exotropia; ET esotropia; PEK punctate epithelial keratitis; PEE punctate epithelial erosions; DES dry eye syndrome; MGD meibomian gland dysfunction; ATs artificial tears; PFAT's preservative free artificial tears; NValentinenuclear sclerotic cataract; PSC posterior subcapsular cataract; ERM epi-retinal membrane; PVD posterior vitreous detachment; RD retinal detachment; DM diabetes mellitus; DR diabetic retinopathy; NPDR non-proliferative diabetic retinopathy; PDR proliferative diabetic retinopathy; CSME clinically significant macular edema; DME diabetic macular edema; dbh dot blot hemorrhages; CWS cotton wool spot; POAG primary open angle glaucoma; C/D cup-to-disc ratio; HVF humphrey visual field; GVF goldmann visual field; OCT optical coherence tomography; IOP intraocular pressure; BRVO Branch retinal vein occlusion; CRVO central retinal vein occlusion; CRAO central retinal artery occlusion; BRAO branch retinal artery occlusion; RT retinal tear; SB scleral buckle; PPV pars plana vitrectomy; VH Vitreous hemorrhage; PRP panretinal laser photocoagulation; IVK intravitreal kenalog; VMT vitreomacular traction; MH Macular hole;  NVD neovascularization of the disc; NVE neovascularization elsewhere; AREDS age related eye disease study; ARMD age related macular degeneration; POAG primary open angle glaucoma; EBMD epithelial/anterior basement membrane dystrophy; ACIOL anterior chamber intraocular lens; IOL  intraocular lens; PCIOL posterior chamber intraocular lens; Phaco/IOL phacoemulsification with intraocular lens placement; PVillage of Clarkstonphotorefractive keratectomy; LASIK laser assisted in situ keratomileusis; HTN hypertension; DM diabetes mellitus; COPD chronic obstructive pulmonary disease

## 2022-03-01 ENCOUNTER — Inpatient Hospital Stay: Payer: Medicare Other

## 2022-03-01 VITALS — BP 122/56

## 2022-03-01 DIAGNOSIS — D649 Anemia, unspecified: Secondary | ICD-10-CM

## 2022-03-01 DIAGNOSIS — N183 Chronic kidney disease, stage 3 unspecified: Secondary | ICD-10-CM

## 2022-03-01 LAB — HEMOGLOBIN AND HEMATOCRIT, BLOOD
HCT: 34.8 % — ABNORMAL LOW (ref 36.0–46.0)
Hemoglobin: 10.5 g/dL — ABNORMAL LOW (ref 12.0–15.0)

## 2022-03-01 MED ORDER — EPOETIN ALFA-EPBX 40000 UNIT/ML IJ SOLN: 20000.0000 [IU] | Freq: Once | INTRAMUSCULAR | Status: DC

## 2022-03-01 NOTE — Progress Notes (Signed)
HBG 10.5 today per MD hold retacrit

## 2022-03-02 ENCOUNTER — Ambulatory Visit (INDEPENDENT_AMBULATORY_CARE_PROVIDER_SITE_OTHER): Payer: Medicare Other | Admitting: Ophthalmology

## 2022-03-02 ENCOUNTER — Encounter (INDEPENDENT_AMBULATORY_CARE_PROVIDER_SITE_OTHER): Payer: Self-pay | Admitting: Ophthalmology

## 2022-03-02 DIAGNOSIS — H35371 Puckering of macula, right eye: Secondary | ICD-10-CM

## 2022-03-02 DIAGNOSIS — E113213 Type 2 diabetes mellitus with mild nonproliferative diabetic retinopathy with macular edema, bilateral: Secondary | ICD-10-CM

## 2022-03-02 DIAGNOSIS — I10 Essential (primary) hypertension: Secondary | ICD-10-CM | POA: Diagnosis not present

## 2022-03-02 DIAGNOSIS — H35033 Hypertensive retinopathy, bilateral: Secondary | ICD-10-CM | POA: Diagnosis not present

## 2022-03-02 DIAGNOSIS — H34831 Tributary (branch) retinal vein occlusion, right eye, with macular edema: Secondary | ICD-10-CM | POA: Diagnosis not present

## 2022-03-02 DIAGNOSIS — Z961 Presence of intraocular lens: Secondary | ICD-10-CM

## 2022-03-02 MED ORDER — FARICIMAB-SVOA 6 MG/0.05ML IZ SOLN
6.0000 mg | INTRAVITREAL | Status: AC | PRN
  Administered 2022-03-02: 6 mg via INTRAVITREAL

## 2022-03-15 ENCOUNTER — Inpatient Hospital Stay: Payer: Medicare Other | Attending: Internal Medicine

## 2022-03-15 ENCOUNTER — Inpatient Hospital Stay: Payer: Medicare Other

## 2022-03-15 DIAGNOSIS — Z79899 Other long term (current) drug therapy: Secondary | ICD-10-CM | POA: Diagnosis not present

## 2022-03-15 DIAGNOSIS — D649 Anemia, unspecified: Secondary | ICD-10-CM

## 2022-03-15 DIAGNOSIS — I129 Hypertensive chronic kidney disease with stage 1 through stage 4 chronic kidney disease, or unspecified chronic kidney disease: Secondary | ICD-10-CM | POA: Insufficient documentation

## 2022-03-15 DIAGNOSIS — N183 Chronic kidney disease, stage 3 unspecified: Secondary | ICD-10-CM | POA: Insufficient documentation

## 2022-03-15 DIAGNOSIS — D631 Anemia in chronic kidney disease: Secondary | ICD-10-CM | POA: Diagnosis present

## 2022-03-15 LAB — HEMOGLOBIN AND HEMATOCRIT, BLOOD
HCT: 34.4 % — ABNORMAL LOW (ref 36.0–46.0)
Hemoglobin: 10.7 g/dL — ABNORMAL LOW (ref 12.0–15.0)

## 2022-03-30 ENCOUNTER — Encounter: Payer: Self-pay | Admitting: Nurse Practitioner

## 2022-03-30 ENCOUNTER — Inpatient Hospital Stay: Payer: Medicare Other

## 2022-03-30 ENCOUNTER — Inpatient Hospital Stay (HOSPITAL_BASED_OUTPATIENT_CLINIC_OR_DEPARTMENT_OTHER): Payer: Medicare Other | Admitting: Nurse Practitioner

## 2022-03-30 VITALS — BP 143/42 | HR 72 | Temp 97.4°F | Wt 166.0 lb

## 2022-03-30 DIAGNOSIS — N1832 Chronic kidney disease, stage 3b: Secondary | ICD-10-CM

## 2022-03-30 DIAGNOSIS — N183 Chronic kidney disease, stage 3 unspecified: Secondary | ICD-10-CM | POA: Diagnosis not present

## 2022-03-30 DIAGNOSIS — D508 Other iron deficiency anemias: Secondary | ICD-10-CM | POA: Diagnosis not present

## 2022-03-30 DIAGNOSIS — D631 Anemia in chronic kidney disease: Secondary | ICD-10-CM

## 2022-03-30 DIAGNOSIS — D649 Anemia, unspecified: Secondary | ICD-10-CM

## 2022-03-30 LAB — CBC WITH DIFFERENTIAL/PLATELET
Abs Immature Granulocytes: 0.02 10*3/uL (ref 0.00–0.07)
Basophils Absolute: 0.1 10*3/uL (ref 0.0–0.1)
Basophils Relative: 1 %
Eosinophils Absolute: 0.1 10*3/uL (ref 0.0–0.5)
Eosinophils Relative: 3 %
HCT: 32.3 % — ABNORMAL LOW (ref 36.0–46.0)
Hemoglobin: 10.1 g/dL — ABNORMAL LOW (ref 12.0–15.0)
Immature Granulocytes: 0 %
Lymphocytes Relative: 24 %
Lymphs Abs: 1.2 10*3/uL (ref 0.7–4.0)
MCH: 29.2 pg (ref 26.0–34.0)
MCHC: 31.3 g/dL (ref 30.0–36.0)
MCV: 93.4 fL (ref 80.0–100.0)
Monocytes Absolute: 1 10*3/uL (ref 0.1–1.0)
Monocytes Relative: 19 %
Neutro Abs: 2.7 10*3/uL (ref 1.7–7.7)
Neutrophils Relative %: 53 %
Platelets: 331 10*3/uL (ref 150–400)
RBC: 3.46 MIL/uL — ABNORMAL LOW (ref 3.87–5.11)
RDW: 15.1 % (ref 11.5–15.5)
WBC: 5.1 10*3/uL (ref 4.0–10.5)
nRBC: 0 % (ref 0.0–0.2)

## 2022-03-30 LAB — BASIC METABOLIC PANEL
Anion gap: 6 (ref 5–15)
BUN: 26 mg/dL — ABNORMAL HIGH (ref 8–23)
CO2: 23 mmol/L (ref 22–32)
Calcium: 8.6 mg/dL — ABNORMAL LOW (ref 8.9–10.3)
Chloride: 106 mmol/L (ref 98–111)
Creatinine, Ser: 1.66 mg/dL — ABNORMAL HIGH (ref 0.44–1.00)
GFR, Estimated: 32 mL/min — ABNORMAL LOW (ref >60)
Glucose, Bld: 204 mg/dL — ABNORMAL HIGH (ref 70–99)
Potassium: 4.5 mmol/L (ref 3.5–5.1)
Sodium: 135 mmol/L (ref 135–145)

## 2022-03-30 LAB — FERRITIN: Ferritin: 21 ng/mL (ref 11–307)

## 2022-03-30 LAB — IRON AND TIBC
Iron: 147 ug/dL (ref 28–170)
Saturation Ratios: 39 % — ABNORMAL HIGH (ref 10.4–31.8)
TIBC: 375 ug/dL (ref 250–450)
UIBC: 228 ug/dL

## 2022-03-30 MED ORDER — SODIUM CHLORIDE 0.9 % IV SOLN
200.0000 mg | Freq: Once | INTRAVENOUS | Status: AC
  Administered 2022-03-30: 200 mg via INTRAVENOUS
  Filled 2022-03-30: qty 200

## 2022-03-30 MED ORDER — HEPARIN SOD (PORK) LOCK FLUSH 100 UNIT/ML IV SOLN
500.0000 [IU] | Freq: Once | INTRAVENOUS | Status: AC | PRN
  Administered 2022-03-30: 500 [IU]
  Filled 2022-03-30: qty 5

## 2022-03-30 MED ORDER — SODIUM CHLORIDE 0.9 % IV SOLN
Freq: Once | INTRAVENOUS | Status: AC
  Filled 2022-03-30: qty 250

## 2022-03-30 NOTE — Patient Instructions (Signed)

## 2022-03-30 NOTE — Progress Notes (Signed)
[facility] CONSULT NOTE  Patient Care Team: Danella Penton, MD as PCP - General (Internal Medicine) Elnita Maxwell, MD as Referring Physician (Gastroenterology) Earna Coder, MD as Consulting Physician (Hematology and Oncology)  CHIEF COMPLAINTS/PURPOSE OF CONSULTATION: Anemia  HEMATOLOGY HISTORY  # ANEMIA- Jan 2021- 8.8/ferritin 11 [PCP]; N-WBC/platelets? IDA vs other- EGD-2015/colonoscopy-? 2015; 2020- [Dr.Skulskie] ; capsule-2016- ? 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   HISTORY OF PRESENTING ILLNESS: Ambulating independently.  Alone.  Cheryl Leonard 77 y.o. female anemia iron deficiency and CKD III who returns to clinic for follow up. She has fatigue which is stable. Denies black or bloody stools. Also complains of urinary urgency. Would like to see someone for this. She feels better than she did a couple of months ago when she was quite short of breath and hemoglobin was in the 7 range.    Review of Systems  Constitutional:  Positive for malaise/fatigue. Negative for chills, diaphoresis and fever.  HENT:  Negative for nosebleeds and sore throat.   Eyes:  Negative for double vision.  Respiratory:  Positive for shortness of breath. Negative for cough, hemoptysis, sputum production and wheezing.   Cardiovascular:  Negative for chest pain, palpitations, orthopnea and leg swelling.  Gastrointestinal:  Negative for abdominal pain, blood in stool, constipation, diarrhea, heartburn, melena, nausea and vomiting.  Genitourinary:  Negative for dysuria, frequency and urgency.  Musculoskeletal:  Positive for joint pain.  Skin: Negative.  Negative for itching and rash.  Neurological:  Negative for dizziness, tingling, focal weakness, weakness and headaches.  Endo/Heme/Allergies:   Does not bruise/bleed easily.  Psychiatric/Behavioral:  Negative for depression. The patient is not nervous/anxious and does not have insomnia.     MEDICAL HISTORY:  Past Medical History:  Diagnosis Date   Anemia    Anxiety    Arthritis    Gout   Cataracts, both eyes    Diabetic retinopathy (HCC)    NPDR OU   Diverticulitis    GERD (gastroesophageal reflux disease)    Gout    Headache    h/o migraines   History of fracture of patella    right knee   History of positive PPD    Patient always shows positive   Hyperlipidemia    Hypertension    Hypertensive retinopathy    OU   Hypothyroidism    Lichen sclerosus 12/30/2013   of vulva   Metatarsal fracture    Neuropathy    Osteopenia    Peripheral vascular disease (HCC)    Polyneuropathy    numbness and tingling in feet and toes   Renal insufficiency    Stage 3   Sleep apnea    does not use cpap-lost weight    Type 2 diabetes mellitus, uncontrolled     SURGICAL HISTORY: Past Surgical History:  Procedure Laterality Date   ABDOMINAL HYSTERECTOMY     AMPUTATION TOE Right 05/08/2020   Procedure: AMPUTATION TOE-Right 4th Toe;  Surgeon: Rosetta Posner, DPM;  Location: [facility];  Service: Podiatry;  Laterality: Right;   AMPUTATION TOE Left 04/22/2021   Procedure: AMPUTATION TOE - 4TH METARSOPHANGEAL JOINT;  Surgeon: Rosetta Posner, DPM;  Location: [facility];  Service: Podiatry;  Laterality: Left;   APPENDECTOMY     BREAST REDUCTION SURGERY  2001   CATARACT EXTRACTION  [place]   COLONOSCOPY  03/05/2013   Nml - due for repeat 03/06/2018   COLONOSCOPY WITH PROPOFOL N/A 03/18/2019   Procedure: COLONOSCOPY WITH PROPOFOL;  Surgeon: Toledo, Benay Pike, MD;  Location: [facility];  Service: Gastroenterology;  Laterality: N/A;   DIAGNOSTIC LAPAROSCOPY     DILATION AND CURETTAGE OF UTERUS  1989   ENDARTERECTOMY FEMORAL Bilateral 03/09/2018   Procedure: ENDARTERECTOMY FEMORAL;  Surgeon: Algernon Huxley, MD;  Location: [facility]  [facility];  Service: Vascular;  Laterality: Bilateral;   ENDARTERECTOMY POPLITEAL Left 03/09/2018   Procedure: ENDARTERECTOMY POPLITEAL AND SFA;  Surgeon: Algernon Huxley, MD;  Location: [facility];  Service: Vascular;  Laterality: Left;   ESOPHAGOGASTRODUODENOSCOPY  03/05/2013   ESOPHAGOGASTRODUODENOSCOPY (EGD) WITH PROPOFOL N/A 03/18/2019   Procedure: ESOPHAGOGASTRODUODENOSCOPY (EGD) WITH PROPOFOL;  Surgeon: Toledo, Benay Pike, MD;  Location: [facility];  Service: Gastroenterology;  Laterality: N/A;   EYE SURGERY     Eyelid Surgery  2012   INTRAMEDULLARY (IM) NAIL INTERTROCHANTERIC Left 10/30/2015   Procedure: INTRAMEDULLARY (IM) NAIL INTERTROCHANTRIC ;  Surgeon: Hessie Knows, MD;  Location: [facility];  Service: Orthopedics;  Laterality: Left;   KYPHOPLASTY N/A 10/25/2018   Procedure: L4 KYPHOPLASTY;  Surgeon: Hessie Knows, MD;  Location: [facility];  Service: Orthopedics;  Laterality: N/A;   LAPAROSCOPIC HYSTERECTOMY  2000   total   LOWER EXTREMITY ANGIOGRAPHY Left 03/08/2017   Procedure: LOWER EXTREMITY ANGIOGRAPHY;  Surgeon: Algernon Huxley, MD;  Location: [place] [facility];  Service: Cardiovascular;  Laterality: Left;   LOWER EXTREMITY ANGIOGRAPHY Left 10/30/2017   Procedure: LOWER EXTREMITY ANGIOGRAPHY;  Surgeon: Algernon Huxley, MD;  Location: [place] [facility];  Service: Cardiovascular;  Laterality: Left;   LOWER EXTREMITY ANGIOGRAPHY Right 03/08/2018   Procedure: LOWER EXTREMITY ANGIOGRAPHY;  Surgeon: Algernon Huxley, MD;  Location: [place] [facility];  Service: Cardiovascular;  Laterality: Right;   LOWER EXTREMITY ANGIOGRAPHY Left 10/01/2018   Procedure: LOWER EXTREMITY ANGIOGRAPHY;  Surgeon: Algernon Huxley, MD;  Location: [place] [facility];  Service: Cardiovascular;  Laterality: Left;   LOWER EXTREMITY ANGIOGRAPHY Right 10/08/2018   Procedure: LOWER EXTREMITY ANGIOGRAPHY;  Surgeon: Algernon Huxley, MD;  Location: [place] [facility];  Service: Cardiovascular;  Laterality: Right;   LOWER EXTREMITY  ANGIOGRAPHY Right 05/07/2020   Procedure: Lower Extremity Angiography;  Surgeon: Algernon Huxley, MD;  Location: [place] [facility];  Service: Cardiovascular;  Laterality: Right;   LYSIS OF ADHESION  01/25/2021   Procedure: LYSIS OF ADHESION;  Surgeon: Ronny Bacon, MD;  Location: [facility];  Service: General;;   PERIPHERAL VASCULAR INTERVENTION  03/08/2018   Procedure: PERIPHERAL VASCULAR INTERVENTION;  Surgeon: Algernon Huxley, MD;  Location: [place] [facility];  Service: Cardiovascular;;   PORTA CATH INSERTION N/A 02/17/2020   Procedure: PORTA CATH INSERTION;  Surgeon: Algernon Huxley, MD;  Location: [place] [facility];  Service: Cardiovascular;  Laterality: N/A;   REDUCTION MAMMAPLASTY  1997   SACROPLASTY N/A 10/25/2018   Procedure: S1 SACROPLASTY;  Surgeon: Hessie Knows, MD;  Location: [facility];  Service: Orthopedics;  Laterality: N/A;    SOCIAL HISTORY: Social History   Socioeconomic History   Marital status: Married    Spouse name: [name]   Number of children: 3   Years of education: Not on file   Highest education level: Not on file  Occupational History   Occupation: Retail banker  Tobacco Use   Smoking status: Former    Packs/day: 1.00  Years: 20.00    Additional pack years: 0.00    Total pack years: 20.00    Types: Cigarettes    Quit date: 03/07/1996    Years since quitting: 26.0    Passive exposure: Past   Smokeless tobacco: Never   Tobacco comments:    started smoking at age 39 but stopped smoking in 2000  Vaping Use   Vaping Use: Never used  Substance and Sexual Activity   Alcohol use: No    Alcohol/week: 0.0 standard drinks of alcohol   Drug use: No   Sexual activity: Yes    Partners: Male    Birth control/protection: Surgical  Other Topics Concern   Not on file  Social History Narrative   Lives in [place]; with husband; quit > 20 years; no alcohol; used to work at [employer]  at [employer].    Social Determinants of Health   Financial Resource  Strain: Low Risk  (10/08/2018)   Overall Financial Resource Strain (CARDIA)    Difficulty of Paying Living Expenses: Not very hard  Food Insecurity: No Food Insecurity (10/08/2018)   Hunger Vital Sign    Worried About Running Out of Food in the Last Year: Never true    Ran Out of Food in the Last Year: Never true  Transportation Needs: Unknown (10/01/2018)   PRAPARE - Hydrologist (Medical): No    Lack of Transportation (Non-Medical): Not on file  Physical Activity: Unknown (10/01/2018)   Exercise Vital Sign    Days of Exercise per Week: 2 days    Minutes of Exercise per Session: Not on file  Stress: Stress Concern Present (10/01/2018)   [place]    Feeling of Stress : To some extent  Social Connections: Unknown (10/08/2018)   Social Connection and Isolation Panel [NHANES]    Frequency of Communication with Friends and Family: More than three times a week    Frequency of Social Gatherings with Friends and Family: Not on file    Attends Religious Services: Not on file    Active Member of Clubs or Organizations: Not on file    Attends Archivist Meetings: Not on file    Marital Status: Married  Intimate Partner Violence: Not At Risk (10/08/2018)   Humiliation, Afraid, Rape, and Kick questionnaire    Fear of Current or Ex-Partner: No    Emotionally Abused: No    Physically Abused: No    Sexually Abused: No    FAMILY HISTORY: Family History  Problem Relation Age of Onset   Coronary artery disease Father    Heart attack Father    Coronary artery disease Mother    Heart attack Mother    Ovarian cancer Sister 66       sister had hormonal therapy for IVF txs-which increased risk factor for ovarian cancer   Breast cancer Neg Hx     ALLERGIES:  has No Known Allergies.  MEDICATIONS:  Current Outpatient Medications  Medication Sig Dispense Refill   ALPRAZolam (XANAX) 0.25 MG  tablet Take 0.25 mg by mouth daily as needed for anxiety.     amLODipine (NORVASC) 5 MG tablet Take 5 mg by mouth 2 (two) times daily.     aspirin EC 81 MG tablet Take 1 tablet (81 mg total) by mouth daily. 30 tablet 11   cholecalciferol (VITAMIN D) 1000 units tablet Take 1,000 Units by mouth 2 (two) times daily.     dapagliflozin  propanediol (FARXIGA) 5 MG TABS tablet Take 5 mg by mouth daily.     denosumab (PROLIA) 60 MG/ML SOLN injection Inject 60 mg into the skin every 6 (six) months.      escitalopram (LEXAPRO) 10 MG tablet Take 10 mg by mouth daily.     estradiol (ESTRACE) 0.1 MG/GM vaginal cream Place 1 Applicatorful vaginally daily as needed (vaginal irritation).     famotidine (PEPCID) 40 MG tablet Take 40 mg by mouth daily.     furosemide (LASIX) 20 MG tablet Take 20 mg by mouth every Wednesday.     gabapentin (NEURONTIN) 300 MG capsule Take 300 mg by mouth at bedtime as needed (pain).     HYDROcodone-acetaminophen (NORCO/VICODIN) 5-325 MG tablet Take 1 tablet by mouth daily.     levothyroxine (SYNTHROID, LEVOTHROID) 100 MCG tablet Take 100 mcg by mouth daily before breakfast.   3   lidocaine-prilocaine (EMLA) cream Apply 1 application topically as needed (apply prior to port a cath access). 30 g 3   Magnesium 500 MG TABS Take 500 mg by mouth every morning.      metoprolol succinate (TOPROL-XL) 50 MG 24 hr tablet Take 50 mg by mouth daily. Take with or immediately following a meal.     mirtazapine (REMERON) 15 MG tablet Take 15 mg by mouth as needed.     Multiple Vitamin (MULTIVITAMIN WITH MINERALS) TABS tablet Take 1 tablet by mouth daily. Centrum Silver     NOVOLOG FLEXPEN 100 UNIT/ML FlexPen SMARTSIG:8 Unit(s) SUB-Q Morning-Evening     nystatin cream (MYCOSTATIN) Apply 1 application topically daily as needed (Yeast infection).     olmesartan (BENICAR) 20 MG tablet Take 20 mg by mouth daily.     pantoprazole (PROTONIX) 40 MG tablet Take 40 mg by mouth daily.     pantoprazole sodium  (PROTONIX) 40 mg Take 40 mg by mouth every morning.      rivaroxaban (XARELTO) 20 MG TABS tablet Take 1 tablet (20 mg total) by mouth daily with supper. 90 tablet 3   rosuvastatin (CRESTOR) 20 MG tablet Take 20 mg by mouth every morning.     TRESIBA FLEXTOUCH 200 UNIT/ML SOPN Inject 25-30 Units as directed at bedtime. Titrate according to fasting blood glucose not to exceed 50 units a day  5   vitamin B-12 (CYANOCOBALAMIN) 1000 MCG tablet Take 1,000 mcg by mouth daily.     vitamin C (ASCORBIC ACID) 250 MG tablet Take 250 mg by mouth daily.     vitamin E 400 UNIT capsule Take 400 Units by mouth daily.     Zinc Sulfate (ZINC 15 PO) Take 1 tablet by mouth daily.     zolpidem (AMBIEN) 10 MG tablet Take 10 mg by mouth at bedtime.     No current facility-administered medications for this visit.     PHYSICAL EXAMINATION: Vitals:   03/30/22 1343  BP: (!) 143/42  Pulse: 72  Temp: (!) 97.4 F (36.3 C)  SpO2: 99%   Filed Weights   03/30/22 1343  Weight: 166 lb (75.3 kg)   Physical Exam Vitals reviewed.  Constitutional:      Appearance: She is not ill-appearing.     Comments: Alone.  Ambulating independently.  Cardiovascular:     Rate and Rhythm: Normal rate and regular rhythm.  Pulmonary:     Effort: Pulmonary effort is normal. No respiratory distress.     Breath sounds: No wheezing.  Abdominal:     General: There is no distension.  Palpations: Abdomen is soft.     Tenderness: There is no abdominal tenderness. There is no guarding.  Musculoskeletal:        General: No tenderness.  Skin:    General: Skin is warm.     Coloration: Skin is pale.  Neurological:     Mental Status: She is alert and oriented to person, place, and time.  Psychiatric:        Mood and Affect: Mood and affect normal.        Behavior: Behavior normal.     LABORATORY DATA:  I have reviewed the data as listed Lab Results  Component Value Date   WBC 5.1 03/30/2022   HGB 10.1 (L) 03/30/2022   HCT  32.3 (L) 03/30/2022   MCV 93.4 03/30/2022   PLT 331 03/30/2022   Recent Labs    12/06/21 1051 01/18/22 1355 03/30/22 1318  NA 139 135 135  K 4.7 4.9 4.5  CL 108 106 106  CO2 21* 20* 23  GLUCOSE 136* 216* 204*  BUN 30* 42* 26*  CREATININE 1.62* 2.37* 1.66*  CALCIUM 9.5 9.4 8.6*  GFRNONAA 33* 21* 32*  PROT 6.9  --   --   ALBUMIN 3.9  --   --   AST 25  --   --   ALT 18  --   --   ALKPHOS 50  --   --   BILITOT 0.6  --   --    Iron/TIBC/Ferritin/ %Sat    Component Value Date/Time   IRON 147 03/30/2022 1318   TIBC 375 03/30/2022 1318   FERRITIN 21 03/30/2022 1318   IRONPCTSAT 39 (H) 03/30/2022 1318     Intravitreal Injection, Pharmacologic Agent - OD - Right Eye  Result Date: 03/02/2022 Time Out 03/02/2022. 3:10 PM. Confirmed correct patient, procedure, site, and patient consented. Anesthesia Topical anesthesia was used. Anesthetic medications included Lidocaine 2%, Proparacaine 0.5%. Procedure Preparation included 5% betadine to ocular surface, eyelid speculum. A (32 g) needle was used. Injection: 6 mg faricimab-svoa 6 MG/0.05ML   Route: Intravitreal, Site: Right Eye   NDC: [id number], Lot: [id number], Expiration date: 02/03/2024, Waste: 0 mL Post-op Post injection exam found visual acuity of at least counting fingers. The patient tolerated the procedure well. There were no complications. The patient received written and verbal post procedure care education. Post injection medications were not given.   OCT, Retina - OU - Both Eyes  Result Date: 03/02/2022 Right Eye Quality was good. Central Foveal Thickness: 346. Progression has worsened. Findings include no SRF, abnormal foveal contour, epiretinal membrane, intraretinal fluid (Mild Interval increase in IRF superior fovea and macula). Left Eye Quality was good. Central Foveal Thickness: 282. Progression has been stable. Findings include normal foveal contour, no IRF, no SRF. Notes *Images captured and stored on drive Diagnosis /  Impression: OD: BRVO w/ CME - Mild Interval increase in IRF superior fovea and macula OS: NFP; no IRF/SRF--stable Clinical management: See below Abbreviations: NFP - Normal foveal profile. CME - cystoid macular edema. PED - pigment epithelial detachment. IRF - intraretinal fluid. SRF - subretinal fluid. EZ - ellipsoid zone. ERM - epiretinal membrane. ORA - outer retinal atrophy. ORT - outer retinal tubulation. SRHM - subretinal hyper-reflective material    Assessment & Plan:  No problem-specific Assessment & Plan notes found for this encounter.  Normocytic anemia # Anemia/likely secondary to CKD-III/iron deficiency. Jan 2024, Hmg 7.7, ferritin 23, iron sat 20. Currently receiving IV iron to optimize iron stores in setting of  CKD. Last Venofer 02/22/22. Last retacrit 02/15/22. Hmg today 10.1. Normocytic. Ferritin 21. Iron sat 39%. Recommend Venofer today. Continue possible retacrit 20,000 units for hemoglobin < 10 every 2 weeks. Recommend venofer today then every other week. If Retacrit/iron does not improve would consider bone marrow biopsy.   # CKD- stage III-IV. Overall worsened. Dr Wynelle Link.   # Diabetes/complications [id number][Dr.Solum]. 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 On xarelto. S/p left toe amputation. Stable.     # Etiology-CKD-III--IV; GFR ~ 32. Previously 21. Stable. Dr.Kolluru & Dr. Thedore Mins. Follow up planned for April.     # Poor IV access/Mediport placement- stable.    DISPOSITION:  Venofer today 2 weeks- lab (H&H), +/- retacrit 3 weeks- venofer 4 weeks- lab (H&H), +/- retacrit 5 weeks- venofer 6 weeks- lab (H&H), +/- retacrit 7 weeks- venofer 8 weeks- lab (cbc, bmp, ferritin, iron studies), Dr Donneta Romberg, +/- retacrit OR venofer- la  All questions were answered. The patient knows to call the clinic with any problems, questions or concerns.   Alinda Dooms, NP 03/30/2022

## 2022-03-30 NOTE — Progress Notes (Signed)
[facility] Note  04/01/2022     CHIEF COMPLAINT Patient presents for Retina Follow Up   HISTORY OF PRESENT ILLNESS: Cheryl Leonard is a 77 y.o. female who presents to the clinic today for:    HPI     Retina Follow Up   Patient presents with  CRVO/BRVO.  In right eye.  This started 4 weeks ago.  Duration of 4 weeks.  Since onset it is stable.  I, the attending physician,  performed the HPI with the patient and updated documentation appropriately.        Comments   4 week retina follow up BRVO OD and IVV OD pt is reporting no vision changes noticed       Last edited by Bernarda Caffey, MD on 04/01/2022 12:28 PM.     Pt is not having any problems with her vision, pt states her hemoglobin and kidney function have improved   Referring physician: Rusty Aus, MD [place],  [place] [address]   HISTORICAL INFORMATION:   Selected notes from the MEDICAL RECORD NUMBER Referred by Dr. Marvel Plan for concern of DME OD Lab Results  Component Value Date   HGBA1C 7.3 (H) 01/11/2021     CURRENT MEDICATIONS: No current outpatient medications on file. (Ophthalmic Drugs)   No current facility-administered medications for this visit. (Ophthalmic Drugs)   Current Outpatient Medications (Other)  Medication Sig   ALPRAZolam (XANAX) 0.25 MG tablet Take 0.25 mg by mouth daily as needed for anxiety.   amLODipine (NORVASC) 5 MG tablet Take 5 mg by mouth 2 (two) times daily.   aspirin EC 81 MG tablet Take 1 tablet (81 mg total) by mouth daily.   cholecalciferol (VITAMIN D) 1000 units tablet Take 1,000 Units by mouth 2 (two) times daily.   dapagliflozin propanediol (FARXIGA) 5 MG TABS tablet Take 5 mg by mouth daily.   denosumab (PROLIA) 60 MG/ML SOLN injection Inject 60 mg into the skin every 6 (six) months.    escitalopram (LEXAPRO) 10 MG tablet Take 10 mg by mouth daily.   estradiol (ESTRACE) 0.1  MG/GM vaginal cream Place 1 Applicatorful vaginally daily as needed (vaginal irritation).   famotidine (PEPCID) 40 MG tablet Take 40 mg by mouth daily.   furosemide (LASIX) 20 MG tablet Take 20 mg by mouth every Wednesday.   gabapentin (NEURONTIN) 300 MG capsule Take 300 mg by mouth at bedtime as needed (pain).   HYDROcodone-acetaminophen (NORCO/VICODIN) 5-325 MG tablet Take 1 tablet by mouth daily.   levothyroxine (SYNTHROID, LEVOTHROID) 100 MCG tablet Take 100 mcg by mouth daily before breakfast.    lidocaine-prilocaine (EMLA) cream Apply 1 application topically as needed (apply prior to port a cath access).   Magnesium 500 MG TABS Take 500 mg by mouth every morning.    metoprolol succinate (TOPROL-XL) 50 MG 24 hr tablet Take 50 mg by mouth daily. Take with or immediately following a meal.   mirtazapine (REMERON) 15 MG tablet Take 15 mg by mouth as needed.   Multiple Vitamin (MULTIVITAMIN WITH MINERALS) TABS tablet Take 1 tablet by mouth daily. Centrum Silver   NOVOLOG FLEXPEN 100 UNIT/ML FlexPen SMARTSIG:8 Unit(s) SUB-Q Morning-Evening   nystatin cream (MYCOSTATIN) Apply 1 application topically daily as needed (Yeast infection).   olmesartan (BENICAR) 20 MG tablet Take 20 mg by mouth daily.   pantoprazole (PROTONIX) 40 MG tablet Take 40 mg by mouth daily.   pantoprazole sodium (PROTONIX) 40  mg Take 40 mg by mouth every morning.    rivaroxaban (XARELTO) 20 MG TABS tablet Take 1 tablet (20 mg total) by mouth daily with supper.   rosuvastatin (CRESTOR) 20 MG tablet Take 20 mg by mouth every morning.   TRESIBA FLEXTOUCH 200 UNIT/ML SOPN Inject 25-30 Units as directed at bedtime. Titrate according to fasting blood glucose not to exceed 50 units a day   vitamin B-12 (CYANOCOBALAMIN) 1000 MCG tablet Take 1,000 mcg by mouth daily.   vitamin C (ASCORBIC ACID) 250 MG tablet Take 250 mg by mouth daily.   vitamin E 400 UNIT capsule Take 400 Units by mouth daily.   Zinc Sulfate (ZINC 15 PO) Take 1 tablet  by mouth daily.   zolpidem (AMBIEN) 10 MG tablet Take 10 mg by mouth at bedtime.   No current facility-administered medications for this visit. (Other)   REVIEW OF SYSTEMS: ROS   Positive for: Gastrointestinal, Musculoskeletal, Endocrine, Cardiovascular, Eyes, Respiratory Negative for: Constitutional, Neurological, Skin, Genitourinary, HENT, Psychiatric, Allergic/Imm, Heme/Lymph Last edited by Parthenia Ames, COT on 04/01/2022  8:37 AM.      ALLERGIES No Known Allergies  PAST MEDICAL HISTORY Past Medical History:  Diagnosis Date   Anemia    Anxiety    Arthritis    Gout   Cataracts, both eyes    Diabetic retinopathy ([place])    NPDR OU   Diverticulitis    GERD (gastroesophageal reflux disease)    Gout    Headache    h/o migraines   History of fracture of patella    right knee   History of positive PPD    Patient always shows positive   Hyperlipidemia    Hypertension    Hypertensive retinopathy    OU   Hypothyroidism    Lichen sclerosus [id number]   of vulva   Metatarsal fracture    Neuropathy    Osteopenia    Peripheral vascular disease (HCC)    Polyneuropathy    numbness and tingling in feet and toes   Renal insufficiency    Stage 3   Sleep apnea    does not use cpap-lost weight    Type 2 diabetes mellitus, uncontrolled    Past Surgical History:  Procedure Laterality Date   ABDOMINAL HYSTERECTOMY     AMPUTATION TOE Right 05/08/2020   Procedure: AMPUTATION TOE-Right 4th Toe;  Surgeon: Caroline More, DPM;  Location: [facility];  Service: Podiatry;  Laterality: Right;   AMPUTATION TOE Left 04/22/2021   Procedure: AMPUTATION TOE - 4TH METARSOPHANGEAL JOINT;  Surgeon: Caroline More, DPM;  Location: [facility];  Service: Podiatry;  Laterality: Left;   APPENDECTOMY     BREAST REDUCTION SURGERY  2001   CATARACT EXTRACTION     CESAREAN SECTION  1976   COLONOSCOPY  03/05/2013   Nml - due for repeat 03/06/2018   COLONOSCOPY WITH PROPOFOL N/A 03/18/2019   Procedure:  COLONOSCOPY WITH PROPOFOL;  Surgeon: Toledo, Benay Pike, MD;  Location: [facility];  Service: Gastroenterology;  Laterality: N/A;   DIAGNOSTIC LAPAROSCOPY     DILATION AND CURETTAGE OF UTERUS  1989   ENDARTERECTOMY FEMORAL Bilateral 03/09/2018   Procedure: ENDARTERECTOMY FEMORAL;  Surgeon: Algernon Huxley, MD;  Location: [facility];  Service: Vascular;  Laterality: Bilateral;   ENDARTERECTOMY POPLITEAL Left 03/09/2018   Procedure: ENDARTERECTOMY POPLITEAL AND SFA;  Surgeon: Algernon Huxley, MD;  Location: [facility];  Service: Vascular;  Laterality: Left;   ESOPHAGOGASTRODUODENOSCOPY  03/05/2013   ESOPHAGOGASTRODUODENOSCOPY (EGD) WITH PROPOFOL N/A  03/18/2019   Procedure: ESOPHAGOGASTRODUODENOSCOPY (EGD) WITH PROPOFOL;  Surgeon: Toledo, Benay Pike, MD;  Location: [facility];  Service: Gastroenterology;  Laterality: N/A;   EYE SURGERY     Eyelid Surgery  2012   INTRAMEDULLARY (IM) NAIL INTERTROCHANTERIC Left 10/30/2015   Procedure: INTRAMEDULLARY (IM) NAIL INTERTROCHANTRIC ;  Surgeon: Hessie Knows, MD;  Location: [facility];  Service: Orthopedics;  Laterality: Left;   KYPHOPLASTY N/A 10/25/2018   Procedure: L4 KYPHOPLASTY;  Surgeon: Hessie Knows, MD;  Location: [facility];  Service: Orthopedics;  Laterality: N/A;   LAPAROSCOPIC HYSTERECTOMY  2000   total   LOWER EXTREMITY ANGIOGRAPHY Left 03/08/2017   Procedure: LOWER EXTREMITY ANGIOGRAPHY;  Surgeon: Algernon Huxley, MD;  Location: [place] [facility];  Service: Cardiovascular;  Laterality: Left;   LOWER EXTREMITY ANGIOGRAPHY Left 10/30/2017   Procedure: LOWER EXTREMITY ANGIOGRAPHY;  Surgeon: Algernon Huxley, MD;  Location: [place] [facility];  Service: Cardiovascular;  Laterality: Left;   LOWER EXTREMITY ANGIOGRAPHY Right 03/08/2018   Procedure: LOWER EXTREMITY ANGIOGRAPHY;  Surgeon: Algernon Huxley, MD;  Location: [place] [facility];  Service: Cardiovascular;  Laterality: Right;   LOWER EXTREMITY ANGIOGRAPHY Left 10/01/2018   Procedure: LOWER EXTREMITY  ANGIOGRAPHY;  Surgeon: Algernon Huxley, MD;  Location: [place] [facility];  Service: Cardiovascular;  Laterality: Left;   LOWER EXTREMITY ANGIOGRAPHY Right 10/08/2018   Procedure: LOWER EXTREMITY ANGIOGRAPHY;  Surgeon: Algernon Huxley, MD;  Location: [place] [facility];  Service: Cardiovascular;  Laterality: Right;   LOWER EXTREMITY ANGIOGRAPHY Right 05/07/2020   Procedure: Lower Extremity Angiography;  Surgeon: Algernon Huxley, MD;  Location: [place] [facility];  Service: Cardiovascular;  Laterality: Right;   LYSIS OF ADHESION  01/25/2021   Procedure: LYSIS OF ADHESION;  Surgeon: Ronny Bacon, MD;  Location: [facility];  Service: General;;   PERIPHERAL VASCULAR INTERVENTION  03/08/2018   Procedure: PERIPHERAL VASCULAR INTERVENTION;  Surgeon: Algernon Huxley, MD;  Location: [place] [facility];  Service: Cardiovascular;;   PORTA CATH INSERTION N/A 02/17/2020   Procedure: PORTA CATH INSERTION;  Surgeon: Algernon Huxley, MD;  Location: [place] [facility];  Service: Cardiovascular;  Laterality: N/A;   REDUCTION MAMMAPLASTY  1997   SACROPLASTY N/A 10/25/2018   Procedure: S1 SACROPLASTY;  Surgeon: Hessie Knows, MD;  Location: [facility];  Service: Orthopedics;  Laterality: N/A;   FAMILY HISTORY Family History  Problem Relation Age of Onset   Coronary artery disease Father    Heart attack Father    Coronary artery disease Mother    Heart attack Mother    Ovarian cancer Sister 48       sister had hormonal therapy for IVF txs-which increased risk factor for ovarian cancer   Breast cancer Neg Hx    SOCIAL HISTORY Social History   Tobacco Use   Smoking status: Former    Packs/day: 1.00    Years: 20.00    Additional pack years: 0.00    Total pack years: 20.00    Types: Cigarettes    Quit date: 03/07/1996    Years since quitting: 26.0    Passive exposure: Past   Smokeless tobacco: Never   Tobacco comments:    started smoking at age 30 but stopped smoking in 2000  Vaping Use   Vaping Use: Never used   Substance Use Topics   Alcohol use: No    Alcohol/week: 0.0 standard drinks of alcohol   Drug use: No       OPHTHALMIC EXAM: Base  Eye Exam     Visual Acuity (Snellen - Linear)       Right Left   Dist [place] 20/30 20/20 -1   Dist ph [place] NI          Tonometry (Tonopen, 8:41 AM)       Right Left   Pressure 20 21         Pupils       Pupils Dark Light Shape React APD   Right PERRL 3 2 Round Brisk None   Left PERRL 3 2 Round Brisk None         Visual Fields       Left Right    Full Full         Extraocular Movement       Right Left    Full, Ortho Full, Ortho         Neuro/Psych     Oriented x3: Yes   Mood/Affect: Normal         Dilation     Both eyes: 2.5% Phenylephrine @ 8:41 AM           Slit Lamp and Fundus Exam     External Exam       Right Left   External Normal Normal         Slit Lamp Exam       Right Left   Lids/Lashes dermatochalasis dermatochalasis   Conjunctiva/Sclera White and quiet White and quiet   Cornea arcus; well healed cataract wound; 2-3+ diffuse Punctate epithelial erosions, decreased TBUT, mild Anterior basement membrane dystrophy superiorly arcus; well healed cataract wound, 2-3+ diffuse Punctate epithelial erosions, irregualr epi surface, decreased TBUT   Anterior Chamber Deep and quiet Deep and quiet   Iris Round and dilated Round and dilated   Lens PCIOL; open PC PCIOL; open PC   Anterior Vitreous syneresis, Posterior vitreous detachment, vitreous condensations inferiorly syneresis, Posterior vitreous detachment         Fundus Exam       Right Left   Disc Superior hyperemia, mild Pallor Pink and Sharp   C/D Ratio 0.6 0.5   Macula Flat, Blunted foveal reflex, +cystic changes - persistent, +Epiretinal membrane, minimal MA flat; good foveal reflex, no heme or edema, small pigment clump IT to fovea   Vessels attenuated, Tortuous attenuated, Tortuous   Periphery Attached; scattered DBH -- greastest  temporal periphery Attached, no heme           IMAGING AND PROCEDURES  Imaging and Procedures for 04/25/17  OCT, Retina - OU - Both Eyes       Right Eye Quality was good. Central Foveal Thickness: 356. Progression has been stable. Findings include no SRF, abnormal foveal contour, epiretinal membrane, intraretinal fluid (Persistent IRF / cystic changes centrally).   Left Eye Quality was good. Central Foveal Thickness: 285. Progression has been stable. Findings include normal foveal contour, no IRF, no SRF.   Notes *Images captured and stored on drive  Diagnosis / Impression:  OD: BRVO w/ CME - Persistent IRF / cystic changes centrally OS: NFP; no IRF/SRF--stable  Clinical management:  See below  Abbreviations: NFP - Normal foveal profile. CME - cystoid macular edema. PED - pigment epithelial detachment. IRF - intraretinal fluid. SRF - subretinal fluid. EZ - ellipsoid zone. ERM - epiretinal membrane. ORA - outer retinal atrophy. ORT - outer retinal tubulation. SRHM - subretinal hyper-reflective material      Intravitreal Injection, Pharmacologic Agent - OD - Right Eye  Time Out 04/01/2022. 8:48 AM. Confirmed correct patient, procedure, site, and patient consented.   Anesthesia Topical anesthesia was used. Anesthetic medications included Lidocaine 2%, Proparacaine 0.5%.   Procedure Preparation included 5% betadine to ocular surface, eyelid speculum. A (32 g) needle was used.   Injection: 6 mg faricimab-svoa 6 MG/0.05ML   Route: Intravitreal, Site: Right Eye   NDC: [id number], [id number], Expiration date: 03/02/2024, Waste: 0 mL   Post-op Post injection exam found visual acuity of at least counting fingers. The patient tolerated the procedure well. There were no complications. The patient received written and verbal post procedure care education. Post injection medications were not given.             ASSESSMENT/PLAN:    ICD-10-CM   1. Branch retinal  vein occlusion of right eye with macular edema  H34.8310 OCT, Retina - OU - Both Eyes    Intravitreal Injection, Pharmacologic Agent - OD - Right Eye    faricimab-svoa (VABYSMO) 6mg /0.33mL intravitreal injection    2. Both eyes affected by mild nonproliferative diabetic retinopathy with macular edema, associated with type 2 diabetes mellitus ([place])  [id number]     3. Essential hypertension  I10     4. Hypertensive retinopathy of both eyes  H35.033     5. Epiretinal membrane (ERM) of right eye  H35.371     6. Pseudophakia of both eyes  Z96.1      1. BRVO w/ CME OD  - by history, pt states symptoms first noticed 2 wks prior to presentation, but reports changes may have occurred prior  - initial exam with differential tortuosity of vessels (OD > OS)  - FA (02.10.20) shows mild late staining / leakage in macula, staining / leakage of disc -- improving CME  - differential includes DM2 (DME), hypertensive retinopathy, inflammatory etiology / uveitis  - S/P IVA OD #1 (02.08.19), #2 (03.11.19), #3 (04.09.19), #4 (05.20.19), #5 (02.10.20)  - gave IVA OD on 2.10.20 due to pending Eylea4U for 2020 -- resulted in increased IRF/CME  - review of OCTs show persistent IRF and cystic changes --  resistance to IVA   - June 2019 -- switched therapies: S/P IVE OD #1 (06.24.19), #2 (07.24.19), #3 (09.04.19), #4 (10.30.19),#5 (12.30.19), #6 (03.23.20), #7 (05.05.20), #8 (07.16.20), #9 (07.17.20), #10 (08.28.20), #11 (10.13.20), # 12 (11.17.20), #13 (2.8.21), #14 (03.09.21), #15 (04.13.21), #16 (05.11.21), #17 (06.17.21), #18 (07.23.21), #19 (08.30.21), #20 (10.04.21), #21 (11.08.21), #22 (12.08.21), #23 (01.31.22), #24 (02.28.22), #25 (04.01.22), #26 (06.15.22), #27 (07.13.22), #28 (08.17.22), #29 (09.21.22), #30 (10.19.22), #31 (11.16.22), #32 (12.16.22), #33 (01.13.23), #34 (02.10.23), #35 (03.15.23), #36 (04.14.23), #37 (05.15.23), #38 (06.12.23), #39 (07.10.23), #40 (08.07.23), #41 (09.06.23), #42 (10.04.23), #43  (11.01.23) -- IVE resistance  - s/p IVV OD #1 (12.04.23), #2 (01.03.24), #3 (01.31.24), #4 (02.28.24)  - OCT today shows Persistent IRF / cystic changes centrally  - BCVA 20/30 -- stable  - recommend IVV OD #5 today, 03.29.24 w/ f/u in 4 weeks  - RBA of procedure discussed, questions answered  - informed consent obtained  - see procedure note   - Vabysmo informed consent form obtained, signed and scanned on 12.04.23  - Eylea informed consent form obtained, re-signed and scanned on 05.15.23  - Eylea4U benefits investigation completed and pt approved for IVE for 2023  - f/u 4 weeks  -- DFE/OCT/possible injection  2. Mild nonproliferative diabetic retinopathy, both eyes  - A1c 6.2 on 09.12.23; 6.6 on 07.01.23; 6.0 on 10.14.22  - could be contributing to  CME OD  - OS with minimal diabetic retinopathy  - continue to monitor  3,4. Hypertensive retinopathy OU - stable  - as above, may have contributing to CME OD  - discussed importance of tight BP control  - monitor  5. Epiretinal membrane, right eye   - stable nasal ERM  - no indication for surgery at this time  6. Pseudophakia OU  - s/p CE/IOL OU by cataract surgeon in [facility]  - doing well  - monitor  Ophthalmic Meds Ordered this visit:  Meds ordered this encounter  Medications   faricimab-svoa (VABYSMO) 6mg /0.45mL intravitreal injection     Return in about 4 weeks (around 04/29/2022) for f/u BRVO OD, DFE, OCT.  This document serves as a record of services personally performed by Gardiner Sleeper, MD, PhD. It was created on their behalf by San Jetty. Owens Shark, OA an ophthalmic technician. The creation of this record is the provider's dictation and/or activities during the visit.    Electronically signed by: San Jetty. Owens Shark, OA 03.27.2024 1:40 AM   Gardiner Sleeper, M.D., Ph.D. Diseases & Surgery of the Retina and Vitreous [facility]  I have reviewed the above documentation for accuracy and completeness, and I  agree with the above. Gardiner Sleeper, M.D., Ph.D. 04/02/22 1:40 AM   Abbreviations: M myopia (nearsighted); A astigmatism; H hyperopia (farsighted); P presbyopia; Mrx spectacle prescription;  CTL contact lenses; OD right eye; OS left eye; OU both eyes  XT exotropia; ET esotropia; PEK punctate epithelial keratitis; PEE punctate epithelial erosions; DES dry eye syndrome; MGD meibomian gland dysfunction; ATs artificial tears; PFAT's preservative free artificial tears; [place] nuclear sclerotic cataract; PSC posterior subcapsular cataract; ERM epi-retinal membrane; PVD posterior vitreous detachment; RD retinal detachment; DM diabetes mellitus; DR diabetic retinopathy; NPDR non-proliferative diabetic retinopathy; PDR proliferative diabetic retinopathy; CSME clinically significant macular edema; DME diabetic macular edema; dbh dot blot hemorrhages; CWS cotton wool spot; POAG primary open angle glaucoma; C/D cup-to-disc ratio; HVF humphrey visual field; GVF goldmann visual field; OCT optical coherence tomography; IOP intraocular pressure; BRVO Branch retinal vein occlusion; CRVO central retinal vein occlusion; CRAO central retinal artery occlusion; BRAO branch retinal artery occlusion; RT retinal tear; SB scleral buckle; PPV pars plana vitrectomy; VH Vitreous hemorrhage; PRP panretinal laser photocoagulation; IVK intravitreal kenalog; VMT vitreomacular traction; MH Macular hole;  NVD neovascularization of the disc; NVE neovascularization elsewhere; AREDS age related eye disease study; ARMD age related macular degeneration; POAG primary open angle glaucoma; EBMD epithelial/anterior basement membrane dystrophy; ACIOL anterior chamber intraocular lens; IOL intraocular lens; PCIOL posterior chamber intraocular lens; Phaco/IOL phacoemulsification with intraocular lens placement; [place] photorefractive keratectomy; LASIK laser assisted in situ keratomileusis; HTN hypertension; DM diabetes mellitus; COPD chronic obstructive  pulmonary disease

## 2022-04-01 ENCOUNTER — Encounter (INDEPENDENT_AMBULATORY_CARE_PROVIDER_SITE_OTHER): Payer: Self-pay | Admitting: Ophthalmology

## 2022-04-01 ENCOUNTER — Encounter (INDEPENDENT_AMBULATORY_CARE_PROVIDER_SITE_OTHER): Payer: Medicare Other | Admitting: Ophthalmology

## 2022-04-01 ENCOUNTER — Ambulatory Visit (INDEPENDENT_AMBULATORY_CARE_PROVIDER_SITE_OTHER): Payer: Medicare Other | Admitting: Ophthalmology

## 2022-04-01 DIAGNOSIS — H34831 Tributary (branch) retinal vein occlusion, right eye, with macular edema: Secondary | ICD-10-CM | POA: Diagnosis not present

## 2022-04-01 DIAGNOSIS — E113213 Type 2 diabetes mellitus with mild nonproliferative diabetic retinopathy with macular edema, bilateral: Secondary | ICD-10-CM

## 2022-04-01 DIAGNOSIS — I10 Essential (primary) hypertension: Secondary | ICD-10-CM | POA: Diagnosis not present

## 2022-04-01 DIAGNOSIS — Z961 Presence of intraocular lens: Secondary | ICD-10-CM

## 2022-04-01 DIAGNOSIS — H35033 Hypertensive retinopathy, bilateral: Secondary | ICD-10-CM | POA: Diagnosis not present

## 2022-04-01 DIAGNOSIS — H35371 Puckering of macula, right eye: Secondary | ICD-10-CM

## 2022-04-01 MED ORDER — FARICIMAB-SVOA 6 MG/0.05ML IZ SOLN
6.0000 mg | INTRAVITREAL | Status: AC | PRN
  Administered 2022-04-01: 6 mg via INTRAVITREAL

## 2022-04-12 ENCOUNTER — Inpatient Hospital Stay: Payer: Medicare Other | Attending: Internal Medicine

## 2022-04-12 ENCOUNTER — Inpatient Hospital Stay: Payer: Medicare Other

## 2022-04-12 DIAGNOSIS — D509 Iron deficiency anemia, unspecified: Secondary | ICD-10-CM | POA: Insufficient documentation

## 2022-04-19 ENCOUNTER — Encounter: Payer: Self-pay | Admitting: Internal Medicine

## 2022-04-22 ENCOUNTER — Inpatient Hospital Stay: Payer: Medicare Other

## 2022-04-22 VITALS — BP 121/52 | HR 63 | Temp 98.8°F | Resp 17

## 2022-04-22 DIAGNOSIS — D509 Iron deficiency anemia, unspecified: Secondary | ICD-10-CM | POA: Diagnosis not present

## 2022-04-22 DIAGNOSIS — D631 Anemia in chronic kidney disease: Secondary | ICD-10-CM

## 2022-04-22 MED ORDER — SODIUM CHLORIDE 0.9 % IV SOLN
Freq: Once | INTRAVENOUS | Status: AC
  Filled 2022-04-22: qty 250

## 2022-04-22 MED ORDER — HEPARIN SOD (PORK) LOCK FLUSH 100 UNIT/ML IV SOLN
INTRAVENOUS | Status: AC
  Administered 2022-04-22: 500 [IU]
  Filled 2022-04-22: qty 5

## 2022-04-22 MED ORDER — SODIUM CHLORIDE 0.9 % IV SOLN
200.0000 mg | Freq: Once | INTRAVENOUS | Status: AC
  Administered 2022-04-22: 200 mg via INTRAVENOUS
  Filled 2022-04-22: qty 200

## 2022-04-22 NOTE — Patient Instructions (Signed)

## 2022-04-27 ENCOUNTER — Inpatient Hospital Stay: Payer: Medicare Other

## 2022-04-27 DIAGNOSIS — D509 Iron deficiency anemia, unspecified: Secondary | ICD-10-CM | POA: Diagnosis not present

## 2022-04-27 DIAGNOSIS — D631 Anemia in chronic kidney disease: Secondary | ICD-10-CM

## 2022-04-27 LAB — HEMOGLOBIN AND HEMATOCRIT (CANCER CENTER ONLY)
HCT: 34.9 % — ABNORMAL LOW (ref 36.0–46.0)
Hemoglobin: 10.9 g/dL — ABNORMAL LOW (ref 12.0–15.0)

## 2022-04-27 NOTE — Progress Notes (Signed)
[facility] - Clinic Note  04/29/2022     CHIEF COMPLAINT Patient presents for Retina Follow Up   HISTORY OF PRESENT ILLNESS: Cheryl Leonard is a 77 y.o. female who presents to the clinic today for:    HPI     Retina Follow Up   Patient presents with  CRVO/BRVO.  In right eye.  This started 4 weeks ago.  I, the attending physician,  performed the HPI with the patient and updated documentation appropriately.        Comments   Patient here for 4 weeks retina follow up for BRVO OD. Patient states vision doing fine. No eye pain.       Last edited by [name], MD on 04/29/2022  3:47 PM.    Pt states her hemoglobin was 10.9, she is still getting infusions, but she is not getting hormones anymore   Referring physician: Danella Penton, MD [address] [facility] [place],  [place] [address]   HISTORICAL INFORMATION:   Selected notes from the MEDICAL RECORD NUMBER Referred by Dr. Senaida Ores for concern of DME OD Lab Results  Component Value Date   HGBA1C 7.3 (H) 01/11/2021     CURRENT MEDICATIONS: No current outpatient medications on file. (Ophthalmic Drugs)   No current facility-administered medications for this visit. (Ophthalmic Drugs)   Current Outpatient Medications (Other)  Medication Sig   ALPRAZolam (XANAX) 0.25 MG tablet Take 0.25 mg by mouth daily as needed for anxiety.   amLODipine (NORVASC) 5 MG tablet Take 5 mg by mouth 2 (two) times daily.   aspirin EC 81 MG tablet Take 1 tablet (81 mg total) by mouth daily.   cholecalciferol (VITAMIN D) 1000 units tablet Take 1,000 Units by mouth 2 (two) times daily.   dapagliflozin propanediol (FARXIGA) 5 MG TABS tablet Take 5 mg by mouth daily.   denosumab (PROLIA) 60 MG/ML SOLN injection Inject 60 mg into the skin every 6 (six) months.    escitalopram (LEXAPRO) 10 MG tablet Take 10 mg by mouth daily.   estradiol (ESTRACE) 0.1 MG/GM vaginal cream Place 1  Applicatorful vaginally daily as needed (vaginal irritation).   famotidine (PEPCID) 40 MG tablet Take 40 mg by mouth daily.   furosemide (LASIX) 20 MG tablet Take 20 mg by mouth every Wednesday.   gabapentin (NEURONTIN) 300 MG capsule Take 300 mg by mouth at bedtime as needed (pain).   HYDROcodone-acetaminophen (NORCO/VICODIN) 5-325 MG tablet Take 1 tablet by mouth daily.   levothyroxine (SYNTHROID, LEVOTHROID) 100 MCG tablet Take 100 mcg by mouth daily before breakfast.    lidocaine-prilocaine (EMLA) cream Apply 1 application topically as needed (apply prior to port a cath access).   Magnesium 500 MG TABS Take 500 mg by mouth every morning.    metoprolol succinate (TOPROL-XL) 50 MG 24 hr tablet Take 50 mg by mouth daily. Take with or immediately following a meal.   mirtazapine (REMERON) 15 MG tablet Take 15 mg by mouth as needed.   Multiple Vitamin (MULTIVITAMIN WITH MINERALS) TABS tablet Take 1 tablet by mouth daily. Centrum Silver   NOVOLOG FLEXPEN 100 UNIT/ML FlexPen SMARTSIG:8 Unit(s) SUB-Q Morning-Evening   nystatin cream (MYCOSTATIN) Apply 1 application topically daily as needed (Yeast infection).   olmesartan (BENICAR) 20 MG tablet Take 20 mg by mouth daily.   pantoprazole (PROTONIX) 40 MG tablet Take 40 mg by mouth daily.   pantoprazole sodium (PROTONIX) 40 mg Take 40 mg by mouth every morning.  rivaroxaban (XARELTO) 20 MG TABS tablet Take 1 tablet (20 mg total) by mouth daily with supper.   rosuvastatin (CRESTOR) 20 MG tablet Take 20 mg by mouth every morning.   TRESIBA FLEXTOUCH 200 UNIT/ML SOPN Inject 25-30 Units as directed at bedtime. Titrate according to fasting blood glucose not to exceed 50 units a day   vitamin B-12 (CYANOCOBALAMIN) 1000 MCG tablet Take 1,000 mcg by mouth daily.   vitamin C (ASCORBIC ACID) 250 MG tablet Take 250 mg by mouth daily.   vitamin E 400 UNIT capsule Take 400 Units by mouth daily.   Zinc Sulfate (ZINC 15 PO) Take 1 tablet by mouth daily.   zolpidem  (AMBIEN) 10 MG tablet Take 10 mg by mouth at bedtime.   No current facility-administered medications for this visit. (Other)   REVIEW OF SYSTEMS: ROS   Positive for: Gastrointestinal, Musculoskeletal, Endocrine, Cardiovascular, Eyes, Respiratory Negative for: Constitutional, Neurological, Skin, Genitourinary, HENT, Psychiatric, Allergic/Imm, Heme/Lymph Last edited by Laddie Aquas, COA on 04/29/2022  1:27 PM.       ALLERGIES No Known Allergies  PAST MEDICAL HISTORY Past Medical History:  Diagnosis Date   Anemia    Anxiety    Arthritis    Gout   Cataracts, both eyes    Diabetic retinopathy (HCC)    NPDR OU   Diverticulitis    GERD (gastroesophageal reflux disease)    Gout    Headache    h/o migraines   History of fracture of patella    right knee   History of positive PPD    Patient always shows positive   Hyperlipidemia    Hypertension    Hypertensive retinopathy    OU   Hypothyroidism    Lichen sclerosus 12/30/2013   of vulva   Metatarsal fracture    Neuropathy    Osteopenia    Peripheral vascular disease (HCC)    Polyneuropathy    numbness and tingling in feet and toes   Renal insufficiency    Stage 3   Sleep apnea    does not use cpap-lost weight    Type 2 diabetes mellitus, uncontrolled    Past Surgical History:  Procedure Laterality Date   ABDOMINAL HYSTERECTOMY     AMPUTATION TOE Right 05/08/2020   Procedure: AMPUTATION TOE-Right 4th Toe;  Surgeon: Rosetta Posner, DPM;  Location: [facility];  Service: Podiatry;  Laterality: Right;   AMPUTATION TOE Left 04/22/2021   Procedure: AMPUTATION TOE - 4TH METARSOPHANGEAL JOINT;  Surgeon: Rosetta Posner, DPM;  Location: [facility];  Service: Podiatry;  Laterality: Left;   APPENDECTOMY     BREAST REDUCTION SURGERY  2001   CATARACT EXTRACTION     CESAREAN SECTION  1976   COLONOSCOPY  03/05/2013   Nml - due for repeat 03/06/2018   COLONOSCOPY WITH PROPOFOL N/A 03/18/2019   Procedure: COLONOSCOPY WITH PROPOFOL;   Surgeon: Toledo, [name], MD;  Location: [facility];  Service: Gastroenterology;  Laterality: N/A;   DIAGNOSTIC LAPAROSCOPY     DILATION AND CURETTAGE OF UTERUS  1989   ENDARTERECTOMY FEMORAL Bilateral 03/09/2018   Procedure: ENDARTERECTOMY FEMORAL;  Surgeon: Annice Needy, MD;  Location: [facility];  Service: Vascular;  Laterality: Bilateral;   ENDARTERECTOMY POPLITEAL Left 03/09/2018   Procedure: ENDARTERECTOMY POPLITEAL AND SFA;  Surgeon: Annice Needy, MD;  Location: [facility];  Service: Vascular;  Laterality: Left;   ESOPHAGOGASTRODUODENOSCOPY  03/05/2013   ESOPHAGOGASTRODUODENOSCOPY (EGD) WITH PROPOFOL N/A 03/18/2019   Procedure: ESOPHAGOGASTRODUODENOSCOPY (EGD) WITH PROPOFOL;  Surgeon:  Toledo, [name], MD;  Location: [facility];  Service: Gastroenterology;  Laterality: N/A;   EYE SURGERY     Eyelid Surgery  2012   INTRAMEDULLARY (IM) NAIL INTERTROCHANTERIC Left 10/30/2015   Procedure: INTRAMEDULLARY (IM) NAIL INTERTROCHANTRIC ;  Surgeon: Kennedy Bucker, MD;  Location: [facility];  Service: Orthopedics;  Laterality: Left;   KYPHOPLASTY N/A 10/25/2018   Procedure: L4 KYPHOPLASTY;  Surgeon: Kennedy Bucker, MD;  Location: [facility];  Service: Orthopedics;  Laterality: N/A;   LAPAROSCOPIC HYSTERECTOMY  2000   total   LOWER EXTREMITY ANGIOGRAPHY Left 03/08/2017   Procedure: LOWER EXTREMITY ANGIOGRAPHY;  Surgeon: Annice Needy, MD;  Location: [facility];  Service: Cardiovascular;  Laterality: Left;   LOWER EXTREMITY ANGIOGRAPHY Left 10/30/2017   Procedure: LOWER EXTREMITY ANGIOGRAPHY;  Surgeon: Annice Needy, MD;  Location: [facility];  Service: Cardiovascular;  Laterality: Left;   LOWER EXTREMITY ANGIOGRAPHY Right 03/08/2018   Procedure: LOWER EXTREMITY ANGIOGRAPHY;  Surgeon: Annice Needy, MD;  Location: [facility];  Service: Cardiovascular;  Laterality: Right;   LOWER EXTREMITY ANGIOGRAPHY Left 10/01/2018   Procedure: LOWER EXTREMITY ANGIOGRAPHY;  Surgeon: Annice Needy,  MD;  Location: [facility];  Service: Cardiovascular;  Laterality: Left;   LOWER EXTREMITY ANGIOGRAPHY Right 10/08/2018   Procedure: LOWER EXTREMITY ANGIOGRAPHY;  Surgeon: Annice Needy, MD;  Location: [facility];  Service: Cardiovascular;  Laterality: Right;   LOWER EXTREMITY ANGIOGRAPHY Right 05/07/2020   Procedure: Lower Extremity Angiography;  Surgeon: Annice Needy, MD;  Location: [facility];  Service: Cardiovascular;  Laterality: Right;   LYSIS OF ADHESION  01/25/2021   Procedure: LYSIS OF ADHESION;  Surgeon: Campbell Lerner, MD;  Location: [facility];  Service: General;;   PERIPHERAL VASCULAR INTERVENTION  03/08/2018   Procedure: PERIPHERAL VASCULAR INTERVENTION;  Surgeon: Annice Needy, MD;  Location: [facility];  Service: Cardiovascular;;   PORTA CATH INSERTION N/A 02/17/2020   Procedure: PORTA CATH INSERTION;  Surgeon: Annice Needy, MD;  Location: [facility];  Service: Cardiovascular;  Laterality: N/A;   REDUCTION MAMMAPLASTY  1997   SACROPLASTY N/A 10/25/2018   Procedure: S1 SACROPLASTY;  Surgeon: Kennedy Bucker, MD;  Location: [facility];  Service: Orthopedics;  Laterality: N/A;   FAMILY HISTORY Family History  Problem Relation Age of Onset   Coronary artery disease Father    Heart attack Father    Coronary artery disease Mother    Heart attack Mother    Ovarian cancer Sister 89       sister had hormonal therapy for IVF txs-which increased risk factor for ovarian cancer   Breast cancer Neg Hx    SOCIAL HISTORY Social History   Tobacco Use   Smoking status: Former    Packs/day: 1.00    Years: 20.00    Additional pack years: 0.00    Total pack years: 20.00    Types: Cigarettes    Quit date: 03/07/1996    Years since quitting: 26.1    Passive exposure: Past   Smokeless tobacco: Never   Tobacco comments:    started smoking at age 52 but stopped smoking in 2000  Vaping Use   Vaping Use: Never used  Substance Use Topics   Alcohol  use: No    Alcohol/week: 0.0 standard drinks of alcohol   Drug use: No       OPHTHALMIC EXAM: Base Eye Exam     Visual Acuity (Snellen -  Linear)       Right Left   Dist [place] 20/30 20/20   Dist ph [place] NI          Tonometry (Tonopen, 2:16 PM)       Right Left   Pressure 15 16         Pupils       Dark Light Shape React APD   Right 3 2 Round Brisk None   Left 3 2 Round Brisk None         Visual Fields (Counting fingers)       Left Right    Full Full         Extraocular Movement       Right Left    Full, Ortho Full, Ortho         Neuro/Psych     Oriented x3: Yes   Mood/Affect: Normal         Dilation     Both eyes: 1.0% Mydriacyl, 2.5% Phenylephrine @ 1:25 PM           Slit Lamp and Fundus Exam     External Exam       Right Left   External Normal Normal         Slit Lamp Exam       Right Left   Lids/Lashes dermatochalasis dermatochalasis   Conjunctiva/Sclera White and quiet White and quiet   Cornea arcus; well healed cataract wound; 2-3+ diffuse Punctate epithelial erosions, decreased TBUT, mild Anterior basement membrane dystrophy superiorly arcus; well healed cataract wound, 2-3+ diffuse Punctate epithelial erosions, irregualr epi surface, decreased TBUT   Anterior Chamber Deep and quiet Deep and quiet   Iris Round and dilated Round and dilated   Lens PCIOL; open PC PCIOL; open PC   Anterior Vitreous syneresis, Posterior vitreous detachment, vitreous condensations inferiorly syneresis, Posterior vitreous detachment         Fundus Exam       Right Left   Disc Superior hyperemia, mild Pallor Pink and Sharp   C/D Ratio 0.6 0.5   Macula Flat, Blunted foveal reflex, +cystic changes - improved, +Epiretinal membrane, minimal MA flat; good foveal reflex, no heme or edema, small pigment clump IT to fovea   Vessels attenuated, Tortuous attenuated, Tortuous   Periphery Attached; scattered DBH -- greastest temporal periphery Attached,  no heme           IMAGING AND PROCEDURES  Imaging and Procedures for 04/25/17  OCT, Retina - OU - Both Eyes       Right Eye Quality was good. Central Foveal Thickness: 324. Progression has improved. Findings include no IRF, no SRF, abnormal foveal contour, epiretinal membrane (Interval improvement in IRF / cystic changes centrally).   Left Eye Quality was good. Central Foveal Thickness: 289. Progression has been stable. Findings include normal foveal contour, no IRF, no SRF.   Notes *Images captured and stored on drive  Diagnosis / Impression:  OD: BRVO w/ CME - interval improvement in IRF / cystic changes centrally OS: NFP; no IRF/SRF--stable  Clinical management:  See below  Abbreviations: NFP - Normal foveal profile. CME - cystoid macular edema. PED - pigment epithelial detachment. IRF - intraretinal fluid. SRF - subretinal fluid. EZ - ellipsoid zone. ERM - epiretinal membrane. ORA - outer retinal atrophy. ORT - outer retinal tubulation. SRHM - subretinal hyper-reflective material      Intravitreal Injection, Pharmacologic Agent - OD - Right Eye       Time Out  04/29/2022. 2:13 PM. Confirmed correct patient, procedure, site, and patient consented.   Anesthesia Topical anesthesia was used. Anesthetic medications included Lidocaine 2%, Proparacaine 0.5%.   Procedure Preparation included 5% betadine to ocular surface, eyelid speculum. A (32 g) needle was used.   Injection: 6 mg faricimab-svoa 6 MG/0.05ML   Route: Intravitreal, Site: Right Eye   NDC: O8010301, Lot: [id number], Expiration date: 03/02/2024, Waste: 0 mL   Post-op Post injection exam found visual acuity of at least counting fingers. The patient tolerated the procedure well. There were no complications. The patient received written and verbal post procedure care education. Post injection medications were not given.            ASSESSMENT/PLAN:    ICD-10-CM   1. Branch retinal vein occlusion of  right eye with macular edema  H34.8310 OCT, Retina - OU - Both Eyes    Intravitreal Injection, Pharmacologic Agent - OD - Right Eye    faricimab-svoa (VABYSMO) 6mg /0.30mL intravitreal injection    2. Both eyes affected by mild nonproliferative diabetic retinopathy with macular edema, associated with type 2 diabetes mellitus (HCC)  H08.6578     3. Long term (current) use of oral hypoglycemic drugs  Z79.84     4. Current use of insulin (HCC)  Z79.4     5. Essential hypertension  I10     6. Hypertensive retinopathy of both eyes  H35.033     7. Epiretinal membrane (ERM) of right eye  H35.371     8. Pseudophakia of both eyes  Z96.1      1. BRVO w/ CME OD  - by history, pt states symptoms first noticed 2 wks prior to presentation, but reports changes may have occurred prior  - initial exam with differential tortuosity of vessels (OD > OS)  - FA (02.10.20) shows mild late staining / leakage in macula, staining / leakage of disc -- improving CME  - differential includes DM2 (DME), hypertensive retinopathy, inflammatory etiology / uveitis  - S/P IVA OD #1 (02.08.19), #2 (03.11.19), #3 (04.09.19), #4 (05.20.19), #5 (02.10.20)  - gave IVA OD on 2.10.20 due to pending Eylea4U for 2020 -- resulted in increased IRF/CME  - review of OCTs show persistent IRF and cystic changes --  resistance to IVA   - June 2019 -- switched therapies: S/P IVE OD #1 (06.24.19), #2 (07.24.19), #3 (09.04.19), #4 (10.30.19),#5 (12.30.19), #6 (03.23.20), #7 (05.05.20), #8 (07.16.20), #9 (07.17.20), #10 (08.28.20), #11 (10.13.20), # 12 (11.17.20), #13 (2.8.21), #14 (03.09.21), #15 (04.13.21), #16 (05.11.21), #17 (06.17.21), #18 (07.23.21), #19 (08.30.21), #20 (10.04.21), #21 (11.08.21), #22 (12.08.21), #23 (01.31.22), #24 (02.28.22), #25 (04.01.22), #26 (06.15.22), #27 (07.13.22), #28 (08.17.22), #29 (09.21.22), #30 (10.19.22), #31 (11.16.22), #32 (12.16.22), #33 (01.13.23), #34 (02.10.23), #35 (03.15.23), #36 (04.14.23), #37  (05.15.23), #38 (06.12.23), #39 (07.10.23), #40 (08.07.23), #41 (09.06.23), #42 (10.04.23), #43 (11.01.23) -- IVE resistance  - s/p IVV OD #1 (12.04.23), #2 (01.03.24), #3 (01.31.24), #4 (02.28.24), #5 (03.29.24)  - OCT today shows interval improvement in  IRF / cystic changes centrally  - BCVA 20/30 -- stable  - recommend IVV OD #6 today, 04.24.24 w/ f/u in 4 weeks  - RBA of procedure discussed, questions answered  - informed consent obtained  - see procedure note   - Vabysmo informed consent form obtained, signed and scanned on 12.04.23  - Eylea informed consent form obtained, re-signed and scanned on 05.15.23  - Eylea4U benefits investigation completed and pt approved for IVE for 2023  - f/u 4 weeks  --  DFE/OCT/possible injection  2-4. Mild nonproliferative diabetic retinopathy, both eyes  - A1c 6.2 on 03.14.24, 6.2 on 09.12.23; 6.6 on 07.01.23; 6.0 on 10.14.22  - could be contributing to CME OD  - OS with minimal diabetic retinopathy  - continue to monitor  5. Hypertensive retinopathy OU - stable  - as above, may have contributing to CME OD  - discussed importance of tight BP control  - monitor  6. Epiretinal membrane, right eye   - stable nasal ERM  - no indication for surgery at this time  7. Pseudophakia OU  - s/p CE/IOL OU by cataract surgeon in [facility]  - doing well  - monitor  Ophthalmic Meds Ordered this visit:  Meds ordered this encounter  Medications   faricimab-svoa (VABYSMO) 6mg /0.92mL intravitreal injection     Return in about 4 weeks (around 05/27/2022) for f/u BRVO OD, DFE, OCT.  This document serves as a record of services personally performed by Karie Chimera, MD, PhD. It was created on their behalf by Glee Arvin. Manson Passey, OA an ophthalmic technician. The creation of this record is the provider's dictation and/or activities during the visit.    Electronically signed by: Glee Arvin. Manson Passey, [place] 04.24.2024 1:53 AM  Karie Chimera, M.D., Ph.D. Diseases & Surgery of  the Retina and Vitreous [facility]  I have reviewed the above documentation for accuracy and completeness, and I agree with the above. Karie Chimera, M.D., Ph.D. 05/01/22 1:59 AM   Abbreviations: M myopia (nearsighted); A astigmatism; H hyperopia (farsighted); P presbyopia; Mrx spectacle prescription;  CTL contact lenses; OD right eye; OS left eye; OU both eyes  XT exotropia; ET esotropia; PEK punctate epithelial keratitis; PEE punctate epithelial erosions; DES dry eye syndrome; MGD meibomian gland dysfunction; ATs artificial tears; PFAT's preservative free artificial tears; NSC nuclear sclerotic cataract; PSC posterior subcapsular cataract; ERM epi-retinal membrane; PVD posterior vitreous detachment; RD retinal detachment; DM diabetes mellitus; DR diabetic retinopathy; NPDR non-proliferative diabetic retinopathy; PDR proliferative diabetic retinopathy; CSME clinically significant macular edema; DME diabetic macular edema; dbh dot blot hemorrhages; CWS cotton wool spot; POAG primary open angle glaucoma; C/D cup-to-disc ratio; HVF humphrey visual field; GVF goldmann visual field; OCT optical coherence tomography; IOP intraocular pressure; BRVO Branch retinal vein occlusion; CRVO central retinal vein occlusion; CRAO central retinal artery occlusion; BRAO branch retinal artery occlusion; RT retinal tear; SB scleral buckle; PPV pars plana vitrectomy; VH Vitreous hemorrhage; PRP panretinal laser photocoagulation; IVK intravitreal kenalog; VMT vitreomacular traction; MH Macular hole;  NVD neovascularization of the disc; NVE neovascularization elsewhere; AREDS age related eye disease study; ARMD age related macular degeneration; POAG primary open angle glaucoma; EBMD epithelial/anterior basement membrane dystrophy; ACIOL anterior chamber intraocular lens; IOL intraocular lens; PCIOL posterior chamber intraocular lens; Phaco/IOL phacoemulsification with intraocular lens placement; PRK  photorefractive keratectomy; LASIK laser assisted in situ keratomileusis; HTN hypertension; DM diabetes mellitus; COPD chronic obstructive pulmonary disease

## 2022-04-27 NOTE — Progress Notes (Signed)
hgb 10.9 today. Retacrit injection held today.

## 2022-04-29 ENCOUNTER — Encounter (INDEPENDENT_AMBULATORY_CARE_PROVIDER_SITE_OTHER): Payer: Self-pay | Admitting: Ophthalmology

## 2022-04-29 ENCOUNTER — Ambulatory Visit (INDEPENDENT_AMBULATORY_CARE_PROVIDER_SITE_OTHER): Payer: Medicare Other | Admitting: Ophthalmology

## 2022-04-29 DIAGNOSIS — I10 Essential (primary) hypertension: Secondary | ICD-10-CM

## 2022-04-29 DIAGNOSIS — H35371 Puckering of macula, right eye: Secondary | ICD-10-CM

## 2022-04-29 DIAGNOSIS — E113213 Type 2 diabetes mellitus with mild nonproliferative diabetic retinopathy with macular edema, bilateral: Secondary | ICD-10-CM | POA: Diagnosis not present

## 2022-04-29 DIAGNOSIS — H34831 Tributary (branch) retinal vein occlusion, right eye, with macular edema: Secondary | ICD-10-CM | POA: Diagnosis not present

## 2022-04-29 DIAGNOSIS — Z794 Long term (current) use of insulin: Secondary | ICD-10-CM | POA: Diagnosis not present

## 2022-04-29 DIAGNOSIS — Z961 Presence of intraocular lens: Secondary | ICD-10-CM

## 2022-04-29 DIAGNOSIS — Z7984 Long term (current) use of oral hypoglycemic drugs: Secondary | ICD-10-CM | POA: Diagnosis not present

## 2022-04-29 DIAGNOSIS — H35033 Hypertensive retinopathy, bilateral: Secondary | ICD-10-CM

## 2022-04-29 MED ORDER — FARICIMAB-SVOA 6 MG/0.05ML IZ SOLN
6.0000 mg | INTRAVITREAL | Status: AC | PRN
Start: 2022-04-29 — End: 2022-04-29
  Administered 2022-04-29: 6 mg via INTRAVITREAL

## 2022-05-02 ENCOUNTER — Inpatient Hospital Stay: Payer: Medicare Other

## 2022-05-02 VITALS — BP 150/49 | HR 77 | Temp 97.3°F | Resp 18

## 2022-05-02 DIAGNOSIS — D509 Iron deficiency anemia, unspecified: Secondary | ICD-10-CM | POA: Diagnosis not present

## 2022-05-02 DIAGNOSIS — D631 Anemia in chronic kidney disease: Secondary | ICD-10-CM

## 2022-05-02 DIAGNOSIS — N183 Chronic kidney disease, stage 3 unspecified: Secondary | ICD-10-CM

## 2022-05-02 MED ORDER — SODIUM CHLORIDE 0.9 % IV SOLN
200.0000 mg | Freq: Once | INTRAVENOUS | Status: AC
  Administered 2022-05-02: 200 mg via INTRAVENOUS
  Filled 2022-05-02: qty 200

## 2022-05-02 MED ORDER — SODIUM CHLORIDE 0.9% FLUSH
10.0000 mL | Freq: Once | INTRAVENOUS | Status: AC | PRN
  Administered 2022-05-02: 10 mL
  Filled 2022-05-02: qty 10

## 2022-05-02 MED ORDER — SODIUM CHLORIDE 0.9 % IV SOLN
Freq: Once | INTRAVENOUS | Status: AC
  Filled 2022-05-02: qty 250

## 2022-05-02 MED ORDER — HEPARIN SOD (PORK) LOCK FLUSH 100 UNIT/ML IV SOLN
500.0000 [IU] | Freq: Once | INTRAVENOUS | Status: AC | PRN
  Administered 2022-05-02: 500 [IU]
  Filled 2022-05-02: qty 5

## 2022-05-02 NOTE — Patient Instructions (Signed)
[facility]  Discharge Instructions: Thank you for choosing [facility] to provide your oncology and hematology care.  If you have a lab appointment with the Cancer Center, please go directly to the Cancer Center and check in at the registration area.  Wear comfortable clothing and clothing appropriate for easy access to any Portacath or PICC line.   We strive to give you quality time with your provider. You may need to reschedule your appointment if you arrive late (15 or more minutes).  Arriving late affects you and other patients whose appointments are after yours.  Also, if you miss three or more appointments without notifying the office, you may be dismissed from the clinic at the provider's discretion.      For prescription refill requests, have your pharmacy contact our office and allow 72 hours for refills to be completed.    Today you received the following chemotherapy and/or immunotherapy agents Venofer.      To help prevent nausea and vomiting after your treatment, we encourage you to take your nausea medication as directed.  BELOW ARE SYMPTOMS THAT SHOULD BE REPORTED IMMEDIATELY: *FEVER GREATER THAN 100.4 F (38 C) OR HIGHER *CHILLS OR SWEATING *NAUSEA AND VOMITING THAT IS NOT CONTROLLED WITH YOUR NAUSEA MEDICATION *UNUSUAL SHORTNESS OF BREATH *UNUSUAL BRUISING OR BLEEDING *URINARY PROBLEMS (pain or burning when urinating, or frequent urination) *BOWEL PROBLEMS (unusual diarrhea, constipation, pain near the anus) TENDERNESS IN MOUTH AND THROAT WITH OR WITHOUT PRESENCE OF ULCERS (sore throat, sores in mouth, or a toothache) UNUSUAL RASH, SWELLING OR PAIN  UNUSUAL VAGINAL DISCHARGE OR ITCHING   Items with * indicate a potential emergency and should be followed up as soon as possible or go to the Emergency Department if any problems should occur.  Please show the CHEMOTHERAPY ALERT CARD or IMMUNOTHERAPY ALERT CARD at check-in to  the Emergency Department and triage nurse.  Should you have questions after your visit or need to cancel or reschedule your appointment, please contact [facility] [place] [facility]  [phone] and follow the prompts.  Office hours are 8:00 a.m. to 4:30 p.m. Monday - Friday. Please note that voicemails left after 4:00 p.m. may not be returned until the following business day.  We are closed weekends and major holidays. You have access to a nurse at all times for urgent questions. Please call the main number to the clinic [phone] and follow the prompts.  For any non-urgent questions, you may also contact your provider using MyChart. We now offer e-Visits for anyone 18 and older to request care online for non-urgent symptoms. For details visit [email or web address].   Also download the MyChart app! Go to the app store, search "MyChart", open the app, select [place], and log in with your MyChart username and password.   

## 2022-05-10 ENCOUNTER — Other Ambulatory Visit: Payer: Self-pay | Admitting: *Deleted

## 2022-05-10 DIAGNOSIS — D631 Anemia in chronic kidney disease: Secondary | ICD-10-CM

## 2022-05-10 DIAGNOSIS — N183 Anemia in chronic kidney disease: Secondary | ICD-10-CM

## 2022-05-11 ENCOUNTER — Encounter: Payer: Self-pay | Admitting: Internal Medicine

## 2022-05-11 ENCOUNTER — Inpatient Hospital Stay: Payer: Medicare Other

## 2022-05-11 ENCOUNTER — Inpatient Hospital Stay: Payer: Medicare Other | Attending: Internal Medicine

## 2022-05-11 VITALS — BP 125/46

## 2022-05-11 DIAGNOSIS — N183 Chronic kidney disease, stage 3 unspecified: Secondary | ICD-10-CM | POA: Insufficient documentation

## 2022-05-11 DIAGNOSIS — R0609 Other forms of dyspnea: Secondary | ICD-10-CM | POA: Insufficient documentation

## 2022-05-11 DIAGNOSIS — E611 Iron deficiency: Secondary | ICD-10-CM | POA: Insufficient documentation

## 2022-05-11 DIAGNOSIS — D631 Anemia in chronic kidney disease: Secondary | ICD-10-CM | POA: Diagnosis present

## 2022-05-11 DIAGNOSIS — Z87891 Personal history of nicotine dependence: Secondary | ICD-10-CM | POA: Diagnosis not present

## 2022-05-11 LAB — SAMPLE TO BLOOD BANK

## 2022-05-11 LAB — HEMOGLOBIN AND HEMATOCRIT (CANCER CENTER ONLY)
HCT: 29.4 % — ABNORMAL LOW (ref 36.0–46.0)
Hemoglobin: 9.5 g/dL — ABNORMAL LOW (ref 12.0–15.0)

## 2022-05-11 MED ORDER — EPOETIN ALFA-EPBX 20000 UNIT/ML IJ SOLN
20000.0000 [IU] | Freq: Once | INTRAMUSCULAR | Status: AC
  Administered 2022-05-11: 20000 [IU] via SUBCUTANEOUS
  Filled 2022-05-11: qty 1

## 2022-05-13 MED FILL — Iron Sucrose Inj 20 MG/ML (Fe Equiv): INTRAVENOUS | Qty: 10 | Status: AC

## 2022-05-16 ENCOUNTER — Inpatient Hospital Stay: Payer: Medicare Other

## 2022-05-16 VITALS — BP 113/70 | HR 67 | Temp 98.0°F | Resp 18

## 2022-05-16 DIAGNOSIS — N183 Chronic kidney disease, stage 3 unspecified: Secondary | ICD-10-CM

## 2022-05-16 MED ORDER — SODIUM CHLORIDE 0.9 % IV SOLN
200.0000 mg | Freq: Once | INTRAVENOUS | Status: AC
  Administered 2022-05-16: 200 mg via INTRAVENOUS
  Filled 2022-05-16: qty 200

## 2022-05-16 MED ORDER — HEPARIN SOD (PORK) LOCK FLUSH 100 UNIT/ML IV SOLN
INTRAVENOUS | Status: AC
  Filled 2022-05-16: qty 5

## 2022-05-16 MED ORDER — HEPARIN SOD (PORK) LOCK FLUSH 100 UNIT/ML IV SOLN
500.0000 [IU] | Freq: Once | INTRAVENOUS | Status: AC | PRN
  Administered 2022-05-16: 500 [IU]
  Filled 2022-05-16: qty 5

## 2022-05-16 MED ORDER — SODIUM CHLORIDE 0.9 % IV SOLN
Freq: Once | INTRAVENOUS | Status: AC
  Filled 2022-05-16: qty 250

## 2022-05-23 NOTE — Progress Notes (Signed)
[facility] - Clinic Note  05/31/2022     CHIEF COMPLAINT Patient presents for Retina Follow Up   HISTORY OF PRESENT ILLNESS: Cheryl Leonard is a 77 y.o. female who presents to the clinic today for:    HPI     Retina Follow Up   Patient presents with  CRVO/BRVO.  In right eye.  This started 4 weeks ago.  Duration of 4 weeks.  Since onset it is stable.        Comments   4 week retina follow up BRVO OD and IVV OD pt is reporting no vision changes noticed she denies any flashes or floaters       Last edited by Etheleen Mayhew, COT on 05/31/2022  1:58 PM.     Patient has been sick the past several days.   Referring physician: Danella Penton, MD [phone] [facility] [address] [facility] [place],  [place] [address]   HISTORICAL INFORMATION:   Selected notes from the MEDICAL RECORD NUMBER Referred by Dr. Senaida Ores for concern of DME OD Lab Results  Component Value Date   HGBA1C 7.3 (H) 01/11/2021     CURRENT MEDICATIONS: No current outpatient medications on file. (Ophthalmic Drugs)   No current facility-administered medications for this visit. (Ophthalmic Drugs)   Current Outpatient Medications (Other)  Medication Sig   ALPRAZolam (XANAX) 0.25 MG tablet Take 0.25 mg by mouth daily as needed for anxiety.   aspirin EC 81 MG tablet Take 1 tablet (81 mg total) by mouth daily.   cholecalciferol (VITAMIN D) 1000 units tablet Take 1,000 Units by mouth 2 (two) times daily.   dapagliflozin propanediol (FARXIGA) 5 MG TABS tablet Take 5 mg by mouth daily.   denosumab (PROLIA) 60 MG/ML SOLN injection Inject 60 mg into the skin every 6 (six) months.    escitalopram (LEXAPRO) 10 MG tablet Take 10 mg by mouth daily.   estradiol (ESTRACE) 0.1 MG/GM vaginal cream Place 1 Applicatorful vaginally daily as needed (vaginal irritation).   famotidine (PEPCID) 40 MG tablet Take 40 mg by mouth daily.   furosemide (LASIX) 20 MG tablet Take 20 mg  by mouth every Wednesday.   gabapentin (NEURONTIN) 300 MG capsule Take 300 mg by mouth at bedtime as needed (pain).   HYDROcodone-acetaminophen (NORCO/VICODIN) 5-325 MG tablet Take 1 tablet by mouth daily.   levothyroxine (SYNTHROID, LEVOTHROID) 100 MCG tablet Take 100 mcg by mouth daily before breakfast.    lidocaine-prilocaine (EMLA) cream Apply 1 application topically as needed (apply prior to port a cath access).   Magnesium 500 MG TABS Take 500 mg by mouth every morning.    metoprolol succinate (TOPROL-XL) 50 MG 24 hr tablet Take 50 mg by mouth daily. Take with or immediately following a meal.   mirtazapine (REMERON) 15 MG tablet Take 15 mg by mouth as needed.   Multiple Vitamin (MULTIVITAMIN WITH MINERALS) TABS tablet Take 1 tablet by mouth daily. Centrum Silver   NOVOLOG FLEXPEN 100 UNIT/ML FlexPen SMARTSIG:8 Unit(s) SUB-Q Morning-Evening   nystatin cream (MYCOSTATIN) Apply 1 application topically daily as needed (Yeast infection).   olmesartan (BENICAR) 20 MG tablet Take 20 mg by mouth daily.   pantoprazole (PROTONIX) 40 MG tablet Take 40 mg by mouth daily.   rivaroxaban (XARELTO) 20 MG TABS tablet Take 1 tablet (20 mg total) by mouth daily with supper.   rosuvastatin (CRESTOR) 20 MG tablet Take 20 mg by mouth every morning.   sodium bicarbonate 650 MG tablet  Take 650 mg by mouth 2 (two) times daily.   TRESIBA FLEXTOUCH 200 UNIT/ML SOPN Inject 25-30 Units as directed at bedtime. Titrate according to fasting blood glucose not to exceed 50 units a day   vitamin B-12 (CYANOCOBALAMIN) 1000 MCG tablet Take 1,000 mcg by mouth daily.   vitamin C (ASCORBIC ACID) 250 MG tablet Take 250 mg by mouth daily.   vitamin E 400 UNIT capsule Take 400 Units by mouth daily.   Zinc Sulfate (ZINC 15 PO) Take 1 tablet by mouth daily.   zolpidem (AMBIEN) 10 MG tablet Take 10 mg by mouth at bedtime.   No current facility-administered medications for this visit. (Other)   REVIEW OF SYSTEMS: ROS   Positive  for: Gastrointestinal, Musculoskeletal, Endocrine, Cardiovascular, Eyes, Respiratory Negative for: Constitutional, Neurological, Skin, Genitourinary, HENT, Psychiatric, Allergic/Imm, Heme/Lymph Last edited by Etheleen Mayhew, COT on 05/31/2022  1:58 PM.        ALLERGIES No Known Allergies  PAST MEDICAL HISTORY Past Medical History:  Diagnosis Date   Anemia    Anxiety    Arthritis    Gout   Cataracts, both eyes    Diabetic retinopathy (HCC)    NPDR OU   Diverticulitis    GERD (gastroesophageal reflux disease)    Gout    Headache    h/o migraines   History of fracture of patella    right knee   History of positive PPD    Patient always shows positive   Hyperlipidemia    Hypertension    Hypertensive retinopathy    OU   Hypothyroidism    Lichen sclerosus 12/30/2013   of vulva   Metatarsal fracture    Neuropathy    Osteopenia    Peripheral vascular disease (HCC)    Polyneuropathy    numbness and tingling in feet and toes   Renal insufficiency    Stage 3   Sleep apnea    does not use cpap-lost weight    Type 2 diabetes mellitus, uncontrolled    Past Surgical History:  Procedure Laterality Date   ABDOMINAL HYSTERECTOMY     AMPUTATION TOE Right 05/08/2020   Procedure: AMPUTATION TOE-Right 4th Toe;  Surgeon: Rosetta Posner, DPM;  Location: [facility];  Service: Podiatry;  Laterality: Right;   AMPUTATION TOE Left 04/22/2021   Procedure: AMPUTATION TOE - 4TH METARSOPHANGEAL JOINT;  Surgeon: Rosetta Posner, DPM;  Location: [facility];  Service: Podiatry;  Laterality: Left;   APPENDECTOMY     BREAST REDUCTION SURGERY  2001   CATARACT EXTRACTION     CESAREAN SECTION  1976   COLONOSCOPY  03/05/2013   Nml - due for repeat 03/06/2018   COLONOSCOPY WITH PROPOFOL N/A 03/18/2019   Procedure: COLONOSCOPY WITH PROPOFOL;  Surgeon: Toledo, [name], MD;  Location: [facility];  Service: Gastroenterology;  Laterality: N/A;   DIAGNOSTIC LAPAROSCOPY     DILATION AND CURETTAGE OF  UTERUS  1989   ENDARTERECTOMY FEMORAL Bilateral 03/09/2018   Procedure: ENDARTERECTOMY FEMORAL;  Surgeon: Annice Needy, MD;  Location: [facility];  Service: Vascular;  Laterality: Bilateral;   ENDARTERECTOMY POPLITEAL Left 03/09/2018   Procedure: ENDARTERECTOMY POPLITEAL AND SFA;  Surgeon: Annice Needy, MD;  Location: [facility];  Service: Vascular;  Laterality: Left;   ESOPHAGOGASTRODUODENOSCOPY  03/05/2013   ESOPHAGOGASTRODUODENOSCOPY (EGD) WITH PROPOFOL N/A 03/18/2019   Procedure: ESOPHAGOGASTRODUODENOSCOPY (EGD) WITH PROPOFOL;  Surgeon: Toledo, [name], MD;  Location: [facility];  Service: Gastroenterology;  Laterality: N/A;   EYE SURGERY  Eyelid Surgery  2012   INTRAMEDULLARY (IM) NAIL INTERTROCHANTERIC Left 10/30/2015   Procedure: INTRAMEDULLARY (IM) NAIL INTERTROCHANTRIC ;  Surgeon: Kennedy Bucker, MD;  Location: [facility];  Service: Orthopedics;  Laterality: Left;   KYPHOPLASTY N/A 10/25/2018   Procedure: L4 KYPHOPLASTY;  Surgeon: Kennedy Bucker, MD;  Location: [facility];  Service: Orthopedics;  Laterality: N/A;   LAPAROSCOPIC HYSTERECTOMY  2000   total   LOWER EXTREMITY ANGIOGRAPHY Left 03/08/2017   Procedure: LOWER EXTREMITY ANGIOGRAPHY;  Surgeon: Annice Needy, MD;  Location: [facility];  Service: Cardiovascular;  Laterality: Left;   LOWER EXTREMITY ANGIOGRAPHY Left 10/30/2017   Procedure: LOWER EXTREMITY ANGIOGRAPHY;  Surgeon: Annice Needy, MD;  Location: [facility];  Service: Cardiovascular;  Laterality: Left;   LOWER EXTREMITY ANGIOGRAPHY Right 03/08/2018   Procedure: LOWER EXTREMITY ANGIOGRAPHY;  Surgeon: Annice Needy, MD;  Location: [facility];  Service: Cardiovascular;  Laterality: Right;   LOWER EXTREMITY ANGIOGRAPHY Left 10/01/2018   Procedure: LOWER EXTREMITY ANGIOGRAPHY;  Surgeon: Annice Needy, MD;  Location: [facility];  Service: Cardiovascular;  Laterality: Left;   LOWER EXTREMITY ANGIOGRAPHY Right 10/08/2018   Procedure: LOWER EXTREMITY  ANGIOGRAPHY;  Surgeon: Annice Needy, MD;  Location: [facility];  Service: Cardiovascular;  Laterality: Right;   LOWER EXTREMITY ANGIOGRAPHY Right 05/07/2020   Procedure: Lower Extremity Angiography;  Surgeon: Annice Needy, MD;  Location: [facility];  Service: Cardiovascular;  Laterality: Right;   LYSIS OF ADHESION  01/25/2021   Procedure: LYSIS OF ADHESION;  Surgeon: Campbell Lerner, MD;  Location: [facility];  Service: General;;   PERIPHERAL VASCULAR INTERVENTION  03/08/2018   Procedure: PERIPHERAL VASCULAR INTERVENTION;  Surgeon: Annice Needy, MD;  Location: [facility];  Service: Cardiovascular;;   PORTA CATH INSERTION N/A 02/17/2020   Procedure: PORTA CATH INSERTION;  Surgeon: Annice Needy, MD;  Location: [facility];  Service: Cardiovascular;  Laterality: N/A;   REDUCTION MAMMAPLASTY  1997   SACROPLASTY N/A 10/25/2018   Procedure: S1 SACROPLASTY;  Surgeon: Kennedy Bucker, MD;  Location: [facility];  Service: Orthopedics;  Laterality: N/A;   FAMILY HISTORY Family History  Problem Relation Age of Onset   Coronary artery disease Father    Heart attack Father    Coronary artery disease Mother    Heart attack Mother    Ovarian cancer Sister 31       sister had hormonal therapy for IVF txs-which increased risk factor for ovarian cancer   Breast cancer Neg Hx    SOCIAL HISTORY Social History   Tobacco Use   Smoking status: Former    Packs/day: 1.00    Years: 20.00    Additional pack years: 0.00    Total pack years: 20.00    Types: Cigarettes    Quit date: 03/07/1996    Years since quitting: 26.2    Passive exposure: Past   Smokeless tobacco: Never   Tobacco comments:    started smoking at age 35 but stopped smoking in 2000  Vaping Use   Vaping Use: Never used  Substance Use Topics   Alcohol use: No    Alcohol/week: 0.0 standard drinks of alcohol   Drug use: No       OPHTHALMIC EXAM: Base Eye Exam     Visual Acuity (Snellen - Linear)        Right Left   Dist [place] 20/30 20/25 -1  Tonometry (Tonopen, 2:00 PM)       Right Left   Pressure 16 16         Pupils       Pupils Dark Light Shape React APD   Right PERRL 3 2 Round Brisk None   Left PERRL 3 2 Round Brisk None         Visual Fields       Left Right    Full Full         Extraocular Movement       Right Left    Full, Ortho Full, Ortho         Neuro/Psych     Oriented x3: Yes   Mood/Affect: Normal         Dilation     Both eyes: 2.5% Phenylephrine @ 2:01 PM           Slit Lamp and Fundus Exam     External Exam       Right Left   External Normal Normal         Slit Lamp Exam       Right Left   Lids/Lashes dermatochalasis dermatochalasis   Conjunctiva/Sclera White and quiet White and quiet   Cornea arcus; well healed cataract wound; 2-3+ diffuse Punctate epithelial erosions, decreased TBUT, mild Anterior basement membrane dystrophy superiorly arcus; well healed cataract wound, 2-3+ diffuse Punctate epithelial erosions, irregualr epi surface, decreased TBUT   Anterior Chamber Deep and quiet Deep and quiet   Iris Round and dilated Round and dilated   Lens PCIOL; open PC PCIOL; open PC   Anterior Vitreous syneresis, Posterior vitreous detachment, vitreous condensations inferiorly syneresis, Posterior vitreous detachment         Fundus Exam       Right Left   Disc Superior hyperemia, mild Pallor Pink and Sharp   C/D Ratio 0.6 0.5   Macula Flat, Blunted foveal reflex, +cystic changes - improved, +Epiretinal membrane, minimal MA flat; good foveal reflex, no heme or edema, small pigment clump IT to fovea   Vessels attenuated, Tortuous attenuated, Tortuous   Periphery Attached; scattered DBH -- greastest temporal periphery Attached, no heme           IMAGING AND PROCEDURES  Imaging and Procedures for 04/25/17          ASSESSMENT/PLAN:    ICD-10-CM   1. Branch retinal vein occlusion of right eye with  macular edema  H34.8310 OCT, Retina - OU - Both Eyes    2. Both eyes affected by mild nonproliferative diabetic retinopathy with macular edema, associated with type 2 diabetes mellitus (HCC)  Z61.0960     3. Long term (current) use of oral hypoglycemic drugs  Z79.84     4. Current use of insulin (HCC)  Z79.4     5. Essential hypertension  I10     6. Hypertensive retinopathy of both eyes  H35.033     7. Epiretinal membrane (ERM) of right eye  H35.371     8. Pseudophakia of both eyes  Z96.1       1. BRVO w/ CME OD - by history, pt states symptoms first noticed 2 wks prior to presentation, but reports changes may have occurred prior  - initial exam with differential tortuosity of vessels (OD > OS) - FA (02.10.20) shows mild late staining / leakage in macula, staining / leakage of disc -- improving CME - differential includes DM2 (DME), hypertensive retinopathy, inflammatory etiology / uveitis - S/P  IVA OD #1 (02.08.19), #2 (03.11.19), #3 (04.09.19), #4 (05.20.19), #5 (02.10.20) - gave IVA OD on 2.10.20 due to pending Eylea4U for 2020 -- resulted in increased IRF/CME  - review of OCTs show persistent IRF and cystic changes --  resistance to IVA  - June 2019 -- switched therapies:  ========================================================== - S/P IVE OD #1 (06.24.19), #2 (07.24.19), #3 (09.04.19), #4 (10.30.19),#5 (12.30.19), #6 (03.23.20), #7 (05.05.20), #8 (07.16.20), #9 (07.17.20), #10 (08.28.20), #11 (10.13.20), # 12 (11.17.20), #13 (2.8.21), #14 (03.09.21), #15 (04.13.21), #16 (05.11.21), #17 (06.17.21), #18 (07.23.21), #19 (08.30.21), #20 (10.04.21), #21 (11.08.21), #22 (12.08.21), #23 (01.31.22), #24 (02.28.22), #25 (04.01.22), #26 (06.15.22), #27 (07.13.22), #28 (08.17.22), #29 (09.21.22), #30 (10.19.22), #31 (11.16.22), #32 (12.16.22), #33 (01.13.23), #34 (02.10.23), #35 (03.15.23), #36 (04.14.23), #37 (05.15.23), #38 (06.12.23), #39 (07.10.23), #40 (08.07.23), #41 (09.06.23), #42  (10.04.23), #43 (11.01.23) -- IVE resistance ========================================================== -s/p IVV OD #1 (12.04.23), #2 (01.03.24), #3 (01.31.24), #4 (02.28.24), #5 (03.29.24), #6 (04.24.24)  - OCT today shows interval improvement in  IRF / cystic changes centrally  - BCVA 20/30 -- stable  - recommend IVV OD #7 today, 05.28.24 w/ f/u in 4 weeks  - RBA of procedure discussed, questions answered  - informed consent obtained  - see procedure note   - V[facility]ysmo informed consent form obtained, signed and scanned on 12.04.23  - Eylea informed consent form obtained, re-signed and scanned on 05.15.23  - Eylea4U benefits investigation completed and pt approved for IVE for 2023  - f/u 5 weeks  -- DFE/OCT/possible injection  2-4. Mild nonproliferative diabetic retinopathy, both eyes  - A1c 6.2 on 03.14.24, 6.2 on 09.12.23; 6.6 on 07.01.23; 6.0 on 10.14.22  - could be contributing to CME OD  - OS with minimal diabetic retinopathy  - continue to monitor  5. Hypertensive retinopathy OU - stable  - as above, may have contributing to CME OD  - discussed importance of tight BP control  - monitor  6. Epiretinal membrane, right eye   - stable nasal ERM  - no indication for surgery at this time  7. Pseudophakia OU  - s/p CE/IOL OU by cataract surgeon in [facility]  - doing well  - monitor  Ophthalmic Meds Ordered this visit:  No orders of the defined types were placed in this encounter.    No follow-ups on file.  This document serves as a record of services personally performed by Karie Chimera, MD, PhD. It was created on their behalf by Gerilyn Nestle, COT an ophthalmic technician. The creation of this record is the provider's dictation and/or activities during the visit.    Electronically signed by:  Gerilyn Nestle, COT  5.28.24 2:25 PM   Karie Chimera, M.D., Ph.D. Diseases & Surgery of the Retina and Vitreous [facility]  Abbreviations: M myopia  (nearsighted); A astigmatism; H hyperopia (farsighted); P presbyopia; Mrx spectacle prescription;  CTL contact lenses; OD right eye; OS left eye; OU both eyes  XT exotropia; ET esotropia; PEK punctate epithelial keratitis; PEE punctate epithelial erosions; DES dry eye syndrome; MGD meibomian gland dysfunction; ATs artificial tears; PFAT's preservative free artificial tears; NSC nuclear sclerotic cataract; PSC posterior subcapsular cataract; ERM epi-retinal membrane; PVD posterior vitreous detachment; RD retinal detachment; DM diabetes mellitus; DR diabetic retinopathy; NPDR non-proliferative diabetic retinopathy; PDR proliferative diabetic retinopathy; CSME clinically significant macular edema; DME diabetic macular edema; dbh dot blot hemorrhages; CWS cotton wool spot; POAG primary open angle glaucoma; C/D cup-to-disc ratio; HVF humphrey visual field; GVF goldmann  visual field; OCT optical coherence tomography; IOP intraocular pressure; BRVO Branch retinal vein occlusion; CRVO central retinal vein occlusion; CRAO central retinal artery occlusion; BRAO branch retinal artery occlusion; RT retinal tear; SB scleral buckle; PPV pars plana vitrectomy; VH Vitreous hemorrhage; PRP panretinal laser photocoagulation; IVK intravitreal kenalog; VMT vitreomacular traction; MH Macular hole;  NVD neovascularization of the disc; NVE neovascularization elsewhere; AREDS age related eye disease study; ARMD age related macular degeneration; POAG primary open angle glaucoma; EBMD epithelial/anterior basement membrane dystrophy; ACIOL anterior chamber intraocular lens; IOL intraocular lens; PCIOL posterior chamber intraocular lens; Phaco/IOL phacoemulsification with intraocular lens placement; PRK photorefractive keratectomy; LASIK laser assisted in situ keratomileusis; HTN hypertension; DM diabetes mellitus; COPD chronic obstructive pulmonary disease

## 2022-05-24 MED FILL — Iron Sucrose Inj 20 MG/ML (Fe Equiv): INTRAVENOUS | Qty: 10 | Status: AC

## 2022-05-25 ENCOUNTER — Inpatient Hospital Stay (HOSPITAL_BASED_OUTPATIENT_CLINIC_OR_DEPARTMENT_OTHER): Payer: Medicare Other | Admitting: Internal Medicine

## 2022-05-25 ENCOUNTER — Inpatient Hospital Stay: Payer: Medicare Other

## 2022-05-25 ENCOUNTER — Encounter: Payer: Self-pay | Admitting: Internal Medicine

## 2022-05-25 VITALS — BP 163/83 | HR 67 | Temp 98.0°F | Resp 18 | Ht 65.0 in | Wt 173.5 lb

## 2022-05-25 VITALS — BP 136/71 | HR 66

## 2022-05-25 DIAGNOSIS — N1832 Chronic kidney disease, stage 3b: Secondary | ICD-10-CM

## 2022-05-25 DIAGNOSIS — D649 Anemia, unspecified: Secondary | ICD-10-CM

## 2022-05-25 DIAGNOSIS — N183 Chronic kidney disease, stage 3 unspecified: Secondary | ICD-10-CM | POA: Diagnosis not present

## 2022-05-25 DIAGNOSIS — D631 Anemia in chronic kidney disease: Secondary | ICD-10-CM

## 2022-05-25 LAB — CBC WITH DIFFERENTIAL (CANCER CENTER ONLY)
Abs Immature Granulocytes: 0.02 10*3/uL (ref 0.00–0.07)
Basophils Absolute: 0 10*3/uL (ref 0.0–0.1)
Basophils Relative: 1 %
Eosinophils Absolute: 0.2 10*3/uL (ref 0.0–0.5)
Eosinophils Relative: 3 %
HCT: 31.1 % — ABNORMAL LOW (ref 36.0–46.0)
Hemoglobin: 10.1 g/dL — ABNORMAL LOW (ref 12.0–15.0)
Immature Granulocytes: 0 %
Lymphocytes Relative: 18 %
Lymphs Abs: 1 10*3/uL (ref 0.7–4.0)
MCH: 30.7 pg (ref 26.0–34.0)
MCHC: 32.5 g/dL (ref 30.0–36.0)
MCV: 94.5 fL (ref 80.0–100.0)
Monocytes Absolute: 0.7 10*3/uL (ref 0.1–1.0)
Monocytes Relative: 12 %
Neutro Abs: 3.5 10*3/uL (ref 1.7–7.7)
Neutrophils Relative %: 66 %
Platelet Count: 247 10*3/uL (ref 150–400)
RBC: 3.29 MIL/uL — ABNORMAL LOW (ref 3.87–5.11)
RDW: 16.4 % — ABNORMAL HIGH (ref 11.5–15.5)
WBC Count: 5.4 10*3/uL (ref 4.0–10.5)
nRBC: 0 % (ref 0.0–0.2)

## 2022-05-25 LAB — BASIC METABOLIC PANEL - CANCER CENTER ONLY
Anion gap: 11 (ref 5–15)
BUN: 29 mg/dL — ABNORMAL HIGH (ref 8–23)
CO2: 27 mmol/L (ref 22–32)
Calcium: 9.6 mg/dL (ref 8.9–10.3)
Chloride: 101 mmol/L (ref 98–111)
Creatinine: 1.79 mg/dL — ABNORMAL HIGH (ref 0.44–1.00)
GFR, Estimated: 29 mL/min — ABNORMAL LOW (ref >60)
Glucose, Bld: 215 mg/dL — ABNORMAL HIGH (ref 70–99)
Potassium: 4.1 mmol/L (ref 3.5–5.1)
Sodium: 139 mmol/L (ref 135–145)

## 2022-05-25 LAB — FERRITIN: Ferritin: 71 ng/mL (ref 11–307)

## 2022-05-25 LAB — IRON AND TIBC
Iron: 147 ug/dL (ref 28–170)
Saturation Ratios: 43 % — ABNORMAL HIGH (ref 10.4–31.8)
TIBC: 339 ug/dL (ref 250–450)
UIBC: 192 ug/dL

## 2022-05-25 MED ORDER — SODIUM CHLORIDE 0.9 % IV SOLN
Freq: Once | INTRAVENOUS | Status: AC
  Filled 2022-05-25: qty 250

## 2022-05-25 MED ORDER — HEPARIN SOD (PORK) LOCK FLUSH 100 UNIT/ML IV SOLN
500.0000 [IU] | Freq: Once | INTRAVENOUS | Status: AC | PRN
  Administered 2022-05-25: 500 [IU]
  Filled 2022-05-25: qty 5

## 2022-05-25 MED ORDER — SODIUM CHLORIDE 0.9 % IV SOLN
200.0000 mg | Freq: Once | INTRAVENOUS | Status: AC
  Administered 2022-05-25: 200 mg via INTRAVENOUS
  Filled 2022-05-25: qty 10

## 2022-05-25 NOTE — Progress Notes (Signed)
[facility] CONSULT NOTE  Patient Care Team: Danella Penton, MD as PCP - General (Internal Medicine) Elnita Maxwell, MD as Referring Physician (Gastroenterology) Earna Coder, MD as Consulting Physician (Hematology and Oncology)  CHIEF COMPLAINTS/PURPOSE OF CONSULTATION: Anemia  HEMATOLOGY HISTORY  # ANEMIA- Jan 2021- 8.8/ferritin 11 [PCP]; N-WBC/platelets? IDA vs other- EGD-2015/colonoscopy-? 2015; 2020- [Dr.Skulskie] ; capsule-2016- ? 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   HISTORY OF PRESENTING ILLNESS: Ambulating independently.  Alone.  Cheryl Leonard 77 y.o.  female anemia iron deficiency-question CKD-III  here for follow-up.   Fatigue. No visible blood in stool. Dyspnea with exertion. No falls. Uses cane. Generalized achiness. Appetite is good.  No swelling of the legs.  No nausea no vomiting.  No blood in stools or black or stools.  Review of Systems  Constitutional:  Positive for malaise/fatigue. Negative for chills, diaphoresis and fever.  HENT:  Negative for nosebleeds and sore throat.   Eyes:  Negative for double vision.  Respiratory:  Positive for shortness of breath. Negative for cough, hemoptysis, sputum production and wheezing.   Cardiovascular:  Negative for chest pain, palpitations, orthopnea and leg swelling.  Gastrointestinal:  Negative for abdominal pain, blood in stool, constipation, diarrhea, heartburn, melena, nausea and vomiting.  Genitourinary:  Negative for dysuria, frequency and urgency.  Musculoskeletal:  Positive for joint pain.  Skin: Negative.  Negative for itching and rash.  Neurological:  Negative for dizziness, tingling, focal weakness, weakness and headaches.  Endo/Heme/Allergies:  Does not bruise/bleed easily.  Psychiatric/Behavioral:   Negative for depression. The patient is not nervous/anxious and does not have insomnia.     MEDICAL HISTORY:  Past Medical History:  Diagnosis Date   Anemia    Anxiety    Arthritis    Gout   Cataracts, both eyes    Diabetic retinopathy (HCC)    NPDR OU   Diverticulitis    GERD (gastroesophageal reflux disease)    Gout    Headache    h/o migraines   History of fracture of patella    right knee   History of positive PPD    Patient always shows positive   Hyperlipidemia    Hypertension    Hypertensive retinopathy    OU   Hypothyroidism    Lichen sclerosus 12/30/2013   of vulva   Metatarsal fracture    Neuropathy    Osteopenia    Peripheral vascular disease (HCC)    Polyneuropathy    numbness and tingling in feet and toes   Renal insufficiency    Stage 3   Sleep apnea    does not use cpap-lost weight    Type 2 diabetes mellitus, uncontrolled     SURGICAL HISTORY: Past Surgical History:  Procedure Laterality Date   ABDOMINAL HYSTERECTOMY     AMPUTATION TOE Right 05/08/2020   Procedure: AMPUTATION TOE-Right 4th Toe;  Surgeon: Rosetta Posner, DPM;  Location: [facility];  Service: Podiatry;  Laterality: Right;   AMPUTATION TOE Left 04/22/2021   Procedure: AMPUTATION TOE - 4TH METARSOPHANGEAL JOINT;  Surgeon: Rosetta Posner, DPM;  Location: [facility];  Service: Podiatry;  Laterality: Left;   APPENDECTOMY     BREAST REDUCTION SURGERY  2001   CATARACT EXTRACTION     CESAREAN SECTION  1976   COLONOSCOPY  03/05/2013   Nml -  due for repeat 03/06/2018   COLONOSCOPY WITH PROPOFOL N/A 03/18/2019   Procedure: COLONOSCOPY WITH PROPOFOL;  Surgeon: Toledo, [name], MD;  Location: [facility];  Service: Gastroenterology;  Laterality: N/A;   DIAGNOSTIC LAPAROSCOPY     DILATION AND CURETTAGE OF UTERUS  1989   ENDARTERECTOMY FEMORAL Bilateral 03/09/2018   Procedure: ENDARTERECTOMY FEMORAL;  Surgeon: Annice Needy, MD;  Location: [facility];  Service: Vascular;  Laterality: Bilateral;    ENDARTERECTOMY POPLITEAL Left 03/09/2018   Procedure: ENDARTERECTOMY POPLITEAL AND SFA;  Surgeon: Annice Needy, MD;  Location: [facility];  Service: Vascular;  Laterality: Left;   ESOPHAGOGASTRODUODENOSCOPY  03/05/2013   ESOPHAGOGASTRODUODENOSCOPY (EGD) WITH PROPOFOL N/A 03/18/2019   Procedure: ESOPHAGOGASTRODUODENOSCOPY (EGD) WITH PROPOFOL;  Surgeon: Toledo, [name], MD;  Location: [facility];  Service: Gastroenterology;  Laterality: N/A;   EYE SURGERY     Eyelid Surgery  2012   INTRAMEDULLARY (IM) NAIL INTERTROCHANTERIC Left 10/30/2015   Procedure: INTRAMEDULLARY (IM) NAIL INTERTROCHANTRIC ;  Surgeon: Kennedy Bucker, MD;  Location: [facility];  Service: Orthopedics;  Laterality: Left;   KYPHOPLASTY N/A 10/25/2018   Procedure: L4 KYPHOPLASTY;  Surgeon: Kennedy Bucker, MD;  Location: [facility];  Service: Orthopedics;  Laterality: N/A;   LAPAROSCOPIC HYSTERECTOMY  2000   total   LOWER EXTREMITY ANGIOGRAPHY Left 03/08/2017   Procedure: LOWER EXTREMITY ANGIOGRAPHY;  Surgeon: Annice Needy, MD;  Location: [facility];  Service: Cardiovascular;  Laterality: Left;   LOWER EXTREMITY ANGIOGRAPHY Left 10/30/2017   Procedure: LOWER EXTREMITY ANGIOGRAPHY;  Surgeon: Annice Needy, MD;  Location: [facility];  Service: Cardiovascular;  Laterality: Left;   LOWER EXTREMITY ANGIOGRAPHY Right 03/08/2018   Procedure: LOWER EXTREMITY ANGIOGRAPHY;  Surgeon: Annice Needy, MD;  Location: [facility];  Service: Cardiovascular;  Laterality: Right;   LOWER EXTREMITY ANGIOGRAPHY Left 10/01/2018   Procedure: LOWER EXTREMITY ANGIOGRAPHY;  Surgeon: Annice Needy, MD;  Location: [facility];  Service: Cardiovascular;  Laterality: Left;   LOWER EXTREMITY ANGIOGRAPHY Right 10/08/2018   Procedure: LOWER EXTREMITY ANGIOGRAPHY;  Surgeon: Annice Needy, MD;  Location: [facility];  Service: Cardiovascular;  Laterality: Right;   LOWER EXTREMITY ANGIOGRAPHY Right 05/07/2020   Procedure: Lower Extremity  Angiography;  Surgeon: Annice Needy, MD;  Location: [facility];  Service: Cardiovascular;  Laterality: Right;   LYSIS OF ADHESION  01/25/2021   Procedure: LYSIS OF ADHESION;  Surgeon: Campbell Lerner, MD;  Location: [facility];  Service: General;;   PERIPHERAL VASCULAR INTERVENTION  03/08/2018   Procedure: PERIPHERAL VASCULAR INTERVENTION;  Surgeon: Annice Needy, MD;  Location: [facility];  Service: Cardiovascular;;   PORTA CATH INSERTION N/A 02/17/2020   Procedure: PORTA CATH INSERTION;  Surgeon: Annice Needy, MD;  Location: [facility];  Service: Cardiovascular;  Laterality: N/A;   REDUCTION MAMMAPLASTY  1997   SACROPLASTY N/A 10/25/2018   Procedure: S1 SACROPLASTY;  Surgeon: Kennedy Bucker, MD;  Location: [facility];  Service: Orthopedics;  Laterality: N/A;    SOCIAL HISTORY: Social History   Socioeconomic History   Marital status: Married    Spouse name: [name]   Number of children: 3   Years of education: Not on file   Highest education level: Not on file  Occupational History   Occupation: Barrister's clerk  Tobacco Use   Smoking status: Former    Packs/day: 1.00    Years: 20.00    Additional pack years: 0.00  Total pack years: 20.00    Types: Cigarettes    Quit date: 03/07/1996    Years since quitting: 26.2    Passive exposure: Past   Smokeless tobacco: Never   Tobacco comments:    started smoking at age 60 but stopped smoking in 2000  Vaping Use   Vaping Use: Never used  Substance and Sexual Activity   Alcohol use: No    Alcohol/week: 0.0 standard drinks of alcohol   Drug use: No   Sexual activity: Yes    Partners: Male    Birth control/protection: Surgical  Other Topics Concern   Not on file  Social History Narrative   Lives in [place]; with husband; quit > 20 years; no alcohol; used to work at [employer]  at [employer].    Social Determinants of Health   Financial Resource Strain: Low Risk  (10/08/2018)   Overall Financial Resource  Strain (CARDIA)    Difficulty of Paying Living Expenses: Not very hard  Food Insecurity: No Food Insecurity (10/08/2018)   Hunger Vital Sign    Worried About Running Out of Food in the Last Year: Never true    Ran Out of Food in the Last Year: Never true  Transportation Needs: Unknown (10/01/2018)   PRAPARE - Administrator, Civil Service (Medical): No    Lack of Transportation (Non-Medical): Not on file  Physical Activity: Unknown (10/01/2018)   Exercise Vital Sign    Days of Exercise per Week: 2 days    Minutes of Exercise per Session: Not on file  Stress: Stress Concern Present (10/01/2018)   [employer] of Occupational Health - Occupational Stress Questionnaire    Feeling of Stress : To some extent  Social Connections: Unknown (10/08/2018)   Social Connection and Isolation Panel [NHANES]    Frequency of Communication with Friends and Family: More than three times a week    Frequency of Social Gatherings with Friends and Family: Not on file    Attends Religious Services: Not on file    Active Member of Clubs or Organizations: Not on file    Attends Banker Meetings: Not on file    Marital Status: Married  Intimate Partner Violence: Not At Risk (10/08/2018)   Humiliation, Afraid, Rape, and Kick questionnaire    Fear of Current or Ex-Partner: No    Emotionally Abused: No    Physically Abused: No    Sexually Abused: No    FAMILY HISTORY: Family History  Problem Relation Age of Onset   Coronary artery disease Father    Heart attack Father    Coronary artery disease Mother    Heart attack Mother    Ovarian cancer Sister 49       sister had hormonal therapy for IVF txs-which increased risk factor for ovarian cancer   Breast cancer Neg Hx     ALLERGIES:  has No Known Allergies.  MEDICATIONS:  Current Outpatient Medications  Medication Sig Dispense Refill   ALPRAZolam (XANAX) 0.25 MG tablet Take 0.25 mg by mouth daily as needed for anxiety.      aspirin EC 81 MG tablet Take 1 tablet (81 mg total) by mouth daily. 30 tablet 11   cholecalciferol (VITAMIN D) 1000 units tablet Take 1,000 Units by mouth 2 (two) times daily.     dapagliflozin propanediol (FARXIGA) 5 MG TABS tablet Take 5 mg by mouth daily.     denosumab (PROLIA) 60 MG/ML SOLN injection Inject 60 mg into the skin every 6 (  six) months.      escitalopram (LEXAPRO) 10 MG tablet Take 10 mg by mouth daily.     estradiol (ESTRACE) 0.1 MG/GM vaginal cream Place 1 Applicatorful vaginally daily as needed (vaginal irritation).     famotidine (PEPCID) 40 MG tablet Take 40 mg by mouth daily.     furosemide (LASIX) 20 MG tablet Take 20 mg by mouth every Wednesday.     gabapentin (NEURONTIN) 300 MG capsule Take 300 mg by mouth at bedtime as needed (pain).     HYDROcodone-acetaminophen (NORCO/VICODIN) 5-325 MG tablet Take 1 tablet by mouth daily.     levothyroxine (SYNTHROID, LEVOTHROID) 100 MCG tablet Take 100 mcg by mouth daily before breakfast.   3   lidocaine-prilocaine (EMLA) cream Apply 1 application topically as needed (apply prior to port a cath access). 30 g 3   Magnesium 500 MG TABS Take 500 mg by mouth every morning.      metoprolol succinate (TOPROL-XL) 50 MG 24 hr tablet Take 50 mg by mouth daily. Take with or immediately following a meal.     mirtazapine (REMERON) 15 MG tablet Take 15 mg by mouth as needed.     Multiple Vitamin (MULTIVITAMIN WITH MINERALS) TABS tablet Take 1 tablet by mouth daily. Centrum Silver     NOVOLOG FLEXPEN 100 UNIT/ML FlexPen SMARTSIG:8 Unit(s) SUB-Q Morning-Evening     nystatin cream (MYCOSTATIN) Apply 1 application topically daily as needed (Yeast infection).     olmesartan (BENICAR) 20 MG tablet Take 20 mg by mouth daily.     pantoprazole (PROTONIX) 40 MG tablet Take 40 mg by mouth daily.     rivaroxaban (XARELTO) 20 MG TABS tablet Take 1 tablet (20 mg total) by mouth daily with supper. 90 tablet 3   rosuvastatin (CRESTOR) 20 MG tablet Take 20 mg by  mouth every morning.     sodium bicarbonate 650 MG tablet Take 650 mg by mouth 2 (two) times daily.     TRESIBA FLEXTOUCH 200 UNIT/ML SOPN Inject 25-30 Units as directed at bedtime. Titrate according to fasting blood glucose not to exceed 50 units a day  5   vitamin B-12 (CYANOCOBALAMIN) 1000 MCG tablet Take 1,000 mcg by mouth daily.     vitamin C (ASCORBIC ACID) 250 MG tablet Take 250 mg by mouth daily.     vitamin E 400 UNIT capsule Take 400 Units by mouth daily.     Zinc Sulfate (ZINC 15 PO) Take 1 tablet by mouth daily.     zolpidem (AMBIEN) 10 MG tablet Take 10 mg by mouth at bedtime.     No current facility-administered medications for this visit.   Facility-Administered Medications Ordered in Other Visits  Medication Dose Route Frequency Provider Last Rate Last Admin   0.9 %  sodium chloride infusion   Intravenous Once Louretta Shorten R, MD       iron sucrose (VENOFER) 200 mg in sodium chloride 0.9 % 100 mL IVPB  200 mg Intravenous Once Louretta Shorten R, MD          PHYSICAL EXAMINATION:   Vitals:   05/25/22 1441  BP: (!) 163/83  Pulse: 67  Resp: 18  Temp: 98 F (36.7 C)  SpO2: 98%   Filed Weights   05/25/22 1441  Weight: 173 lb 8 oz (78.7 kg)    Physical Exam Constitutional:      Comments: Alone.  Ambulating independently.  HENT:     Head: Normocephalic and atraumatic.     Mouth/Throat:  Pharynx: No oropharyngeal exudate.  Eyes:     Pupils: Pupils are equal, round, and reactive to light.  Cardiovascular:     Rate and Rhythm: Normal rate and regular rhythm.  Pulmonary:     Effort: Pulmonary effort is normal. No respiratory distress.     Breath sounds: Normal breath sounds. No wheezing.  Abdominal:     General: Bowel sounds are normal. There is no distension.     Palpations: Abdomen is soft. There is no mass.     Tenderness: There is no abdominal tenderness. There is no guarding or rebound.  Musculoskeletal:        General: No tenderness.  Normal range of motion.     Cervical back: Normal range of motion and neck supple.  Skin:    General: Skin is warm.  Neurological:     Mental Status: She is alert and oriented to person, place, and time.  Psychiatric:        Mood and Affect: Affect normal.     LABORATORY DATA:  I have reviewed the data as listed Lab Results  Component Value Date   WBC 5.4 05/25/2022   HGB 10.1 (L) 05/25/2022   HCT 31.1 (L) 05/25/2022   MCV 94.5 05/25/2022   PLT 247 05/25/2022   Recent Labs    12/06/21 1051 01/18/22 1355 03/30/22 1318 05/25/22 1413  NA 139 135 135 139  K 4.7 4.9 4.5 4.1  CL 108 106 106 101  CO2 21* 20* 23 27  GLUCOSE 136* 216* 204* 215*  BUN 30* 42* 26* 29*  CREATININE 1.62* 2.37* 1.66* 1.79*  CALCIUM 9.5 9.4 8.6* 9.6  GFRNONAA 33* 21* 32* 29*  PROT 6.9  --   --   --   ALBUMIN 3.9  --   --   --   AST 25  --   --   --   ALT 18  --   --   --   ALKPHOS 50  --   --   --   BILITOT 0.6  --   --   --      Intravitreal Injection, Pharmacologic Agent - OD - Right Eye  Result Date: 04/29/2022 Time Out 04/29/2022. 2:13 PM. Confirmed correct patient, procedure, site, and patient consented. Anesthesia Topical anesthesia was used. Anesthetic medications included Lidocaine 2%, Proparacaine 0.5%. Procedure Preparation included 5% betadine to ocular surface, eyelid speculum. A (32 g) needle was used. Injection: 6 mg faricimab-svoa 6 MG/0.05ML   Route: Intravitreal, Site: Right Eye   NDC: O8010301, Lot: [id number], Expiration date: 03/02/2024, Waste: 0 mL Post-op Post injection exam found visual acuity of at least counting fingers. The patient tolerated the procedure well. There were no complications. The patient received written and verbal post procedure care education. Post injection medications were not given.   OCT, Retina - OU - Both Eyes  Result Date: 04/29/2022 Right Eye Quality was good. Central Foveal Thickness: 324. Progression has improved. Findings include no IRF, no SRF,  abnormal foveal contour, epiretinal membrane (Interval improvement in IRF / cystic changes centrally). Left Eye Quality was good. Central Foveal Thickness: 289. Progression has been stable. Findings include normal foveal contour, no IRF, no SRF. Notes *Images captured and stored on drive Diagnosis / Impression: OD: BRVO w/ CME - interval improvement in IRF / cystic changes centrally OS: NFP; no IRF/SRF--stable Clinical management: See below Abbreviations: NFP - Normal foveal profile. CME - cystoid macular edema. PED - pigment epithelial detachment. IRF - intraretinal fluid. SRF -  subretinal fluid. EZ - ellipsoid zone. ERM - epiretinal membrane. ORA - outer retinal atrophy. ORT - outer retinal tubulation. 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  If Retacrit/iron does not improve consider bone marrow biopsy.  # MARCH  2024- Ferritin- 21; I sat- 30-  hemoglobin 10.2.    proceed with venofer weekly x4. Proceed with venofer today.  Retacrit tomorrow.   # Etiology-CKD-III--IV; GFR ~21 overall worsened [Dr.Kolluru]; awaiting evaluation tomorrow.   # #Diabetes/complications [id number]-293[Dr.Solum]-overall stable; Gangrene Right toes s/p amputation [May 2022]- on xarelto; s/p  left toe amputation- stable.   #Poor IV access/Mediport placement- stable.   Schedule retacrit/venofer on separate days  # DISPOSITION:  # Venofer today;   # schedule tomorrow retacrit   # Venofer weekly x3 more  # every 2 weeks- labs- H&H- possible retacrit   # follow up 8 weeks- MD; port; labs- cbc/bmp; iron studies;ferritin; possible retacrit OR venofer- Dr.B     All questions were answered. The patient knows to call the clinic with any problems, questions or concerns.    Earna Coder, MD 05/25/2022 3:16 PM

## 2022-05-25 NOTE — Patient Instructions (Signed)
#  Recommend gentle iron [iron biglycinate; 28 mg ] 1 pill a day.  This pill is unlikely to cause stomach upset or cause constipation.  

## 2022-05-25 NOTE — Assessment & Plan Note (Addendum)
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  If Retacrit/iron does not improve consider bone marrow biopsy.  #Recommend gentle iron [iron biglycinate; 28 mg ] 1 pill a day.  This pill is unlikely to cause stomach upset or cause constipation.   # MARCH  2024- Ferritin- 21; I sat- 39%-  hemoglobin 10.1. HOLD retacrit; Proceed with venofer.   # Etiology-CKD-III--IV; GFR ~21 overall worsened [Dr.Kolluru];- stable.   # #Diabetes/complications [id number]-293[Dr.Solum]-overall stable; Gangrene Right toes s/p amputation [May 2022]- on xarelto; s/p  left toe amputation- stable.   #Poor IV access/Mediport placement- stable.   Schedule retacrit/venofer on separate days  # DISPOSITION:  # Venofer today   # every 2 weeks- labs- H&H- possible retacrit x 6   # follow up 12  weeks- MD; port; labs- cbc/bmp; iron studies;ferritin; possible retacrit OR venofer- Dr.B

## 2022-05-25 NOTE — Progress Notes (Signed)
Fatigue. No visible blood in stool. Dyspnea with exertion. No falls. Uses cane. Generalized achiness. Appetite is good.

## 2022-05-27 ENCOUNTER — Encounter (INDEPENDENT_AMBULATORY_CARE_PROVIDER_SITE_OTHER): Payer: Medicare Other | Admitting: Ophthalmology

## 2022-05-31 ENCOUNTER — Ambulatory Visit (INDEPENDENT_AMBULATORY_CARE_PROVIDER_SITE_OTHER): Payer: Medicare Other | Admitting: Ophthalmology

## 2022-05-31 ENCOUNTER — Encounter (INDEPENDENT_AMBULATORY_CARE_PROVIDER_SITE_OTHER): Payer: Self-pay | Admitting: Ophthalmology

## 2022-05-31 DIAGNOSIS — Z961 Presence of intraocular lens: Secondary | ICD-10-CM

## 2022-05-31 DIAGNOSIS — Z794 Long term (current) use of insulin: Secondary | ICD-10-CM | POA: Diagnosis not present

## 2022-05-31 DIAGNOSIS — H35033 Hypertensive retinopathy, bilateral: Secondary | ICD-10-CM

## 2022-05-31 DIAGNOSIS — H34831 Tributary (branch) retinal vein occlusion, right eye, with macular edema: Secondary | ICD-10-CM

## 2022-05-31 DIAGNOSIS — E113213 Type 2 diabetes mellitus with mild nonproliferative diabetic retinopathy with macular edema, bilateral: Secondary | ICD-10-CM

## 2022-05-31 DIAGNOSIS — Z7984 Long term (current) use of oral hypoglycemic drugs: Secondary | ICD-10-CM | POA: Diagnosis not present

## 2022-05-31 DIAGNOSIS — H35371 Puckering of macula, right eye: Secondary | ICD-10-CM

## 2022-05-31 DIAGNOSIS — I10 Essential (primary) hypertension: Secondary | ICD-10-CM

## 2022-05-31 MED ORDER — FARICIMAB-SVOA 6 MG/0.05ML IZ SOLN
6.0000 mg | INTRAVITREAL | Status: AC | PRN
Start: 2022-05-31 — End: 2022-05-31
  Administered 2022-05-31: 6 mg via INTRAVITREAL

## 2022-06-07 ENCOUNTER — Inpatient Hospital Stay: Payer: Medicare Other

## 2022-06-07 ENCOUNTER — Inpatient Hospital Stay: Payer: Medicare Other | Attending: Internal Medicine

## 2022-06-07 DIAGNOSIS — D631 Anemia in chronic kidney disease: Secondary | ICD-10-CM

## 2022-06-07 DIAGNOSIS — N184 Chronic kidney disease, stage 4 (severe): Secondary | ICD-10-CM | POA: Insufficient documentation

## 2022-06-07 DIAGNOSIS — D649 Anemia, unspecified: Secondary | ICD-10-CM | POA: Diagnosis present

## 2022-06-07 DIAGNOSIS — E611 Iron deficiency: Secondary | ICD-10-CM | POA: Diagnosis not present

## 2022-06-07 LAB — SAMPLE TO BLOOD BANK

## 2022-06-07 LAB — HEMOGLOBIN AND HEMATOCRIT (CANCER CENTER ONLY)
HCT: 30.5 % — ABNORMAL LOW (ref 36.0–46.0)
Hemoglobin: 10 g/dL — ABNORMAL LOW (ref 12.0–15.0)

## 2022-06-07 NOTE — Progress Notes (Signed)
Hgb 10.0. retacrit injection held

## 2022-06-21 ENCOUNTER — Inpatient Hospital Stay: Payer: Medicare Other

## 2022-06-21 DIAGNOSIS — D649 Anemia, unspecified: Secondary | ICD-10-CM

## 2022-06-21 DIAGNOSIS — N183 Chronic kidney disease, stage 3 unspecified: Secondary | ICD-10-CM

## 2022-06-21 DIAGNOSIS — D631 Anemia in chronic kidney disease: Secondary | ICD-10-CM

## 2022-06-21 LAB — HEMOGLOBIN AND HEMATOCRIT (CANCER CENTER ONLY)
HCT: 32.1 % — ABNORMAL LOW (ref 36.0–46.0)
Hemoglobin: 10.4 g/dL — ABNORMAL LOW (ref 12.0–15.0)

## 2022-06-21 LAB — SAMPLE TO BLOOD BANK

## 2022-06-21 NOTE — Progress Notes (Signed)
Hbg is 10.4 today, hold retacrit

## 2022-06-28 NOTE — Progress Notes (Signed)
[facility] - Clinic Note  07/04/2022     CHIEF COMPLAINT Patient presents for Retina Follow Up   HISTORY OF PRESENT ILLNESS: Cheryl Leonard is a 77 y.o. female who presents to the clinic today for:    HPI     Retina Follow Up   Patient presents with  CRVO/BRVO.  In right eye.  This started months ago.  Duration of 5 weeks.  Since onset it is stable.  I, the attending physician,  performed the HPI with the patient and updated documentation appropriately.        Comments   Patient feels that there has been a change in her vision. The vision is a little cloudy. She is not using eye drops.       Last edited by [name], MD on 07/04/2022  4:04 PM.    Patient states she was putting on her eye make up the other day and had a hard time seeing to put it on the left eye  Referring physician: Danella Penton, MD [address] [facility] [address] [facility] [place],  [place] [address]   HISTORICAL INFORMATION:   Selected notes from the MEDICAL RECORD NUMBER Referred by Dr. Senaida Ores for concern of DME OD Lab Results  Component Value Date   HGBA1C 7.3 (H) 01/11/2021     CURRENT MEDICATIONS: No current outpatient medications on file. (Ophthalmic Drugs)   No current facility-administered medications for this visit. (Ophthalmic Drugs)   Current Outpatient Medications (Other)  Medication Sig   ALPRAZolam (XANAX) 0.25 MG tablet Take 0.25 mg by mouth daily as needed for anxiety.   aspirin EC 81 MG tablet Take 1 tablet (81 mg total) by mouth daily.   cholecalciferol (VITAMIN D) 1000 units tablet Take 1,000 Units by mouth 2 (two) times daily.   dapagliflozin propanediol (FARXIGA) 5 MG TABS tablet Take 5 mg by mouth daily.   denosumab (PROLIA) 60 MG/ML SOLN injection Inject 60 mg into the skin every 6 (six) months.    escitalopram (LEXAPRO) 10 MG tablet Take 10 mg by mouth daily.   estradiol (ESTRACE) 0.1 MG/GM vaginal cream Place 1 Applicatorful  vaginally daily as needed (vaginal irritation).   famotidine (PEPCID) 40 MG tablet Take 40 mg by mouth daily.   furosemide (LASIX) 20 MG tablet Take 20 mg by mouth every Wednesday.   gabapentin (NEURONTIN) 300 MG capsule Take 300 mg by mouth at bedtime as needed (pain).   HYDROcodone-acetaminophen (NORCO/VICODIN) 5-325 MG tablet Take 1 tablet by mouth daily.   levothyroxine (SYNTHROID, LEVOTHROID) 100 MCG tablet Take 100 mcg by mouth daily before breakfast.    lidocaine-prilocaine (EMLA) cream Apply 1 application topically as needed (apply prior to port a cath access).   Magnesium 500 MG TABS Take 500 mg by mouth every morning.    metoprolol succinate (TOPROL-XL) 50 MG 24 hr tablet Take 50 mg by mouth daily. Take with or immediately following a meal.   mirtazapine (REMERON) 15 MG tablet Take 15 mg by mouth as needed.   Multiple Vitamin (MULTIVITAMIN WITH MINERALS) TABS tablet Take 1 tablet by mouth daily. Centrum Silver   NOVOLOG FLEXPEN 100 UNIT/ML FlexPen SMARTSIG:8 Unit(s) SUB-Q Morning-Evening   nystatin cream (MYCOSTATIN) Apply 1 application topically daily as needed (Yeast infection).   olmesartan (BENICAR) 20 MG tablet Take 20 mg by mouth daily.   pantoprazole (PROTONIX) 40 MG tablet Take 40 mg by mouth daily.   rivaroxaban (XARELTO) 20 MG TABS tablet Take 1  tablet (20 mg total) by mouth daily with supper.   rosuvastatin (CRESTOR) 20 MG tablet Take 20 mg by mouth every morning.   sodium bicarbonate 650 MG tablet Take 650 mg by mouth 2 (two) times daily.   TRESIBA FLEXTOUCH 200 UNIT/ML SOPN Inject 25-30 Units as directed at bedtime. Titrate according to fasting blood glucose not to exceed 50 units a day   vitamin B-12 (CYANOCOBALAMIN) 1000 MCG tablet Take 1,000 mcg by mouth daily.   vitamin C (ASCORBIC ACID) 250 MG tablet Take 250 mg by mouth daily.   vitamin E 400 UNIT capsule Take 400 Units by mouth daily.   Zinc Sulfate (ZINC 15 PO) Take 1 tablet by mouth daily.   zolpidem (AMBIEN) 10  MG tablet Take 10 mg by mouth at bedtime.   No current facility-administered medications for this visit. (Other)   REVIEW OF SYSTEMS: ROS   Positive for: Gastrointestinal, Musculoskeletal, Endocrine, Cardiovascular, Eyes, Respiratory Negative for: Constitutional, Neurological, Skin, Genitourinary, HENT, Psychiatric, Allergic/Imm, Heme/Lymph Last edited by Charlette Caffey, COT on 07/04/2022  1:24 PM.      ALLERGIES No Known Allergies  PAST MEDICAL HISTORY Past Medical History:  Diagnosis Date   Anemia    Anxiety    Arthritis    Gout   Cataracts, both eyes    Diabetic retinopathy (HCC)    NPDR OU   Diverticulitis    GERD (gastroesophageal reflux disease)    Gout    Headache    h/o migraines   History of fracture of patella    right knee   History of positive PPD    Patient always shows positive   Hyperlipidemia    Hypertension    Hypertensive retinopathy    OU   Hypothyroidism    Lichen sclerosus 12/30/2013   of vulva   Metatarsal fracture    Neuropathy    Osteopenia    Peripheral vascular disease (HCC)    Polyneuropathy    numbness and tingling in feet and toes   Renal insufficiency    Stage 3   Sleep apnea    does not use cpap-lost weight    Type 2 diabetes mellitus, uncontrolled    Past Surgical History:  Procedure Laterality Date   ABDOMINAL HYSTERECTOMY     AMPUTATION TOE Right 05/08/2020   Procedure: AMPUTATION TOE-Right 4th Toe;  Surgeon: Rosetta Posner, DPM;  Location: [facility];  Service: Podiatry;  Laterality: Right;   AMPUTATION TOE Left 04/22/2021   Procedure: AMPUTATION TOE - 4TH METARSOPHANGEAL JOINT;  Surgeon: Rosetta Posner, DPM;  Location: [facility];  Service: Podiatry;  Laterality: Left;   APPENDECTOMY     BREAST REDUCTION SURGERY  2001   CATARACT EXTRACTION     CESAREAN SECTION  1976   COLONOSCOPY  03/05/2013   Nml - due for repeat 03/06/2018   COLONOSCOPY WITH PROPOFOL N/A 03/18/2019   Procedure: COLONOSCOPY WITH PROPOFOL;  Surgeon: Toledo,  [name], MD;  Location: [facility];  Service: Gastroenterology;  Laterality: N/A;   DIAGNOSTIC LAPAROSCOPY     DILATION AND CURETTAGE OF UTERUS  1989   ENDARTERECTOMY FEMORAL Bilateral 03/09/2018   Procedure: ENDARTERECTOMY FEMORAL;  Surgeon: Annice Needy, MD;  Location: [facility];  Service: Vascular;  Laterality: Bilateral;   ENDARTERECTOMY POPLITEAL Left 03/09/2018   Procedure: ENDARTERECTOMY POPLITEAL AND SFA;  Surgeon: Annice Needy, MD;  Location: [facility];  Service: Vascular;  Laterality: Left;   ESOPHAGOGASTRODUODENOSCOPY  03/05/2013   ESOPHAGOGASTRODUODENOSCOPY (EGD) WITH PROPOFOL N/A 03/18/2019  Procedure: ESOPHAGOGASTRODUODENOSCOPY (EGD) WITH PROPOFOL;  Surgeon: Toledo, [name], MD;  Location: [facility];  Service: Gastroenterology;  Laterality: N/A;   EYE SURGERY     Eyelid Surgery  2012   INTRAMEDULLARY (IM) NAIL INTERTROCHANTERIC Left 10/30/2015   Procedure: INTRAMEDULLARY (IM) NAIL INTERTROCHANTRIC ;  Surgeon: Kennedy Bucker, MD;  Location: [facility];  Service: Orthopedics;  Laterality: Left;   KYPHOPLASTY N/A 10/25/2018   Procedure: L4 KYPHOPLASTY;  Surgeon: Kennedy Bucker, MD;  Location: [facility];  Service: Orthopedics;  Laterality: N/A;   LAPAROSCOPIC HYSTERECTOMY  2000   total   LOWER EXTREMITY ANGIOGRAPHY Left 03/08/2017   Procedure: LOWER EXTREMITY ANGIOGRAPHY;  Surgeon: Annice Needy, MD;  Location: [facility];  Service: Cardiovascular;  Laterality: Left;   LOWER EXTREMITY ANGIOGRAPHY Left 10/30/2017   Procedure: LOWER EXTREMITY ANGIOGRAPHY;  Surgeon: Annice Needy, MD;  Location: [facility];  Service: Cardiovascular;  Laterality: Left;   LOWER EXTREMITY ANGIOGRAPHY Right 03/08/2018   Procedure: LOWER EXTREMITY ANGIOGRAPHY;  Surgeon: Annice Needy, MD;  Location: [facility];  Service: Cardiovascular;  Laterality: Right;   LOWER EXTREMITY ANGIOGRAPHY Left 10/01/2018   Procedure: LOWER EXTREMITY ANGIOGRAPHY;  Surgeon: Annice Needy, MD;  Location: [facility];  Service: Cardiovascular;  Laterality: Left;   LOWER EXTREMITY ANGIOGRAPHY Right 10/08/2018   Procedure: LOWER EXTREMITY ANGIOGRAPHY;  Surgeon: Annice Needy, MD;  Location: [facility];  Service: Cardiovascular;  Laterality: Right;   LOWER EXTREMITY ANGIOGRAPHY Right 05/07/2020   Procedure: Lower Extremity Angiography;  Surgeon: Annice Needy, MD;  Location: [facility];  Service: Cardiovascular;  Laterality: Right;   LYSIS OF ADHESION  01/25/2021   Procedure: LYSIS OF ADHESION;  Surgeon: Campbell Lerner, MD;  Location: [facility];  Service: General;;   PERIPHERAL VASCULAR INTERVENTION  03/08/2018   Procedure: PERIPHERAL VASCULAR INTERVENTION;  Surgeon: Annice Needy, MD;  Location: [facility];  Service: Cardiovascular;;   PORTA CATH INSERTION N/A 02/17/2020   Procedure: PORTA CATH INSERTION;  Surgeon: Annice Needy, MD;  Location: [facility];  Service: Cardiovascular;  Laterality: N/A;   REDUCTION MAMMAPLASTY  1997   SACROPLASTY N/A 10/25/2018   Procedure: S1 SACROPLASTY;  Surgeon: Kennedy Bucker, MD;  Location: [facility];  Service: Orthopedics;  Laterality: N/A;   FAMILY HISTORY Family History  Problem Relation Age of Onset   Coronary artery disease Father    Heart attack Father    Coronary artery disease Mother    Heart attack Mother    Ovarian cancer Sister 45       sister had hormonal therapy for IVF txs-which increased risk factor for ovarian cancer   Breast cancer Neg Hx    SOCIAL HISTORY Social History   Tobacco Use   Smoking status: Former    Packs/day: 1.00    Years: 20.00    Additional pack years: 0.00    Total pack years: 20.00    Types: Cigarettes    Quit date: 03/07/1996    Years since quitting: 26.3    Passive exposure: Past   Smokeless tobacco: Never   Tobacco comments:    started smoking at age 36 but stopped smoking in 2000  Vaping Use   Vaping Use: Never used  Substance Use Topics   Alcohol use: No     Alcohol/week: 0.0 standard drinks of alcohol   Drug use: No       OPHTHALMIC EXAM: Base Eye Exam  Visual Acuity (Snellen - Linear)       Right Left   Dist [place] 20/30 20/20   Dist ph [place] NI          Tonometry (Tonopen, 1:27 PM)       Right Left   Pressure 14 14         Pupils       Dark Light Shape React APD   Right 3 2 Round Brisk None   Left 3 2 Round Brisk None         Visual Fields       Left Right    Full Full         Extraocular Movement       Right Left    Full, Ortho Full, Ortho         Neuro/Psych     Oriented x3: Yes   Mood/Affect: Normal         Dilation     Both eyes: 1.0% Mydriacyl, 2.5% Phenylephrine @ 1:25 PM           Slit Lamp and Fundus Exam     External Exam       Right Left   External Normal Normal         Slit Lamp Exam       Right Left   Lids/Lashes dermatochalasis dermatochalasis   Conjunctiva/Sclera White and quiet White and quiet   Cornea arcus; well healed cataract wound; 2-3+ diffuse Punctate epithelial erosions, decreased TBUT, mild Anterior basement membrane dystrophy superiorly arcus; well healed cataract wound, 2-3+ diffuse Punctate epithelial erosions, irregualr epi surface, decreased TBUT   Anterior Chamber Deep and quiet Deep and quiet   Iris Round and dilated Round and dilated   Lens PCIOL; open PC PCIOL; open PC   Anterior Vitreous syneresis, Posterior vitreous detachment, vitreous condensations inferiorly syneresis, Posterior vitreous detachment         Fundus Exam       Right Left   Disc Superior hyperemia, mild Pallor Pink and Sharp   C/D Ratio 0.6 0.5   Macula Flat, Blunted foveal reflex, +cystic changes, +Epiretinal membrane, minimal MA flat; good foveal reflex, no heme or edema, small pigment clump IT to fovea   Vessels attenuated, Tortuous attenuated, Tortuous   Periphery Attached; scattered DBH -- greastest temporal periphery Attached, no heme           IMAGING AND  PROCEDURES  Imaging and Procedures for 04/25/17  OCT, Retina - OU - Both Eyes       Right Eye Quality was good. Central Foveal Thickness: 322. Progression has worsened. Findings include no IRF, no SRF, abnormal foveal contour, epiretinal membrane (Mild interval increase in IRF / cystic changes temporal fovea and macula).   Left Eye Quality was good. Central Foveal Thickness: 286. Progression has been stable. Findings include normal foveal contour, no IRF, no SRF.   Notes *Images captured and stored on drive  Diagnosis / Impression:  OD: BRVO w/ CME -- Mild interval increase in IRF / cystic changes temporal fovea and macula OS: NFP; no IRF/SRF--stable  Clinical management:  See below  Abbreviations: NFP - Normal foveal profile. CME - cystoid macular edema. PED - pigment epithelial detachment. IRF - intraretinal fluid. SRF - subretinal fluid. EZ - ellipsoid zone. ERM - epiretinal membrane. ORA - outer retinal atrophy. ORT - outer retinal tubulation. SRHM - subretinal hyper-reflective material      Intravitreal Injection, Pharmacologic Agent - OD - Right Eye  Time Out 07/04/2022. 1:56 PM. Confirmed correct patient, procedure, site, and patient consented.   Anesthesia Topical anesthesia was used. Anesthetic medications included Lidocaine 2%, Proparacaine 0.5%.   Procedure Preparation included 5% betadine to ocular surface, eyelid speculum. A (32 g) needle was used.   Injection: 6 mg faricimab-svoa 6 MG/0.05ML   Route: Intravitreal, Site: Right Eye   NDC: [phone], Lot: [id number], Expiration date: 05/02/2024, Waste: 0 mL   Post-op Post injection exam found visual acuity of at least counting fingers. The patient tolerated the procedure well. There were no complications. The patient received written and verbal post procedure care education. Post injection medications were not given.             ASSESSMENT/PLAN:    ICD-10-CM   1. Branch retinal vein occlusion of  right eye with macular edema  H34.8310 OCT, Retina - OU - Both Eyes    Intravitreal Injection, Pharmacologic Agent - OD - Right Eye    faricimab-svoa (VABYSMO) 6mg /0.70mL intravitreal injection    2. Both eyes affected by mild nonproliferative diabetic retinopathy with macular edema, associated with type 2 diabetes mellitus (HCC)  N82.9562     3. Long term (current) use of oral hypoglycemic drugs  Z79.84     4. Current use of insulin (HCC)  Z79.4     5. Essential hypertension  I10     6. Hypertensive retinopathy of both eyes  H35.033     7. Epiretinal membrane (ERM) of right eye  H35.371     8. Pseudophakia of both eyes  Z96.1      1. BRVO w/ CME OD - by history, pt states symptoms first noticed 2 wks prior to presentation, but reports changes may have occurred prior  - initial exam with differential tortuosity of vessels (OD > OS) - FA (02.10.20) shows mild late staining / leakage in macula, staining / leakage of disc -- improving CME - differential includes DM2 (DME), hypertensive retinopathy, inflammatory etiology / uveitis - S/P IVA OD #1 (02.08.19), #2 (03.11.19), #3 (04.09.19), #4 (05.20.19), #5 (02.10.20) - gave IVA OD on 2.10.20 due to pending Eylea4U for 2020 -- resulted in increased IRF/CME  - review of OCTs show persistent IRF and cystic changes --  resistance to IVA  - June 2019 -- switched therapies:  ========================================================== - S/P IVE OD #1 (06.24.19), #2 (07.24.19), #3 (09.04.19), #4 (10.30.19),#5 (12.30.19), #6 (03.23.20), #7 (05.05.20), #8 (07.16.20), #9 (07.17.20), #10 (08.28.20), #11 (10.13.20), # 12 (11.17.20), #13 (2.8.21), #14 (03.09.21), #15 (04.13.21), #16 (05.11.21), #17 (06.17.21), #18 (07.23.21), #19 (08.30.21), #20 (10.04.21), #21 (11.08.21), #22 (12.08.21), #23 (01.31.22), #24 (02.28.22), #25 (04.01.22), #26 (06.15.22), #27 (07.13.22), #28 (08.17.22), #29 (09.21.22), #30 (10.19.22), #31 (11.16.22), #32 (12.16.22), #33  (01.13.23), #34 (02.10.23), #35 (03.15.23), #36 (04.14.23), #37 (05.15.23), #38 (06.12.23), #39 (07.10.23), #40 (08.07.23), #41 (09.06.23), #42 (10.04.23), #43 (11.01.23) -- IVE resistance ========================================================== **interval increase in IRF at 5 weeks on 07.01.24 exam (IVV)** - s/p IVV OD #1 (12.04.23), #2 (01.03.24), #3 (01.31.24), #4 (02.28.24), #5 (03.29.24), #6 (04.24.24), #7 (05.28.24)  - OCT today shows sMild interval increase in IRF / cystic changes temporal fovea and macula at 5 wks  - BCVA 20/30 -- stable  - recommend IVV OD #8 today, 07.01.24 w/ f/u back to 4 weeks  - RBA of procedure discussed, questions answered  - informed consent obtained  - see procedure note   - Vabysmo informed consent form obtained, signed and scanned on 12.04.23  - f/u 4 weeks  -- DFE/OCT/possible injection  2-4. Mild nonproliferative diabetic retinopathy, both eyes  - A1c 6.2 on 03.14.24, 6.2 on 09.12.23; 6.6 on 07.01.23; 6.0 on 10.14.22  - could be contributing to CME OD  - OS with minimal diabetic retinopathy  - continue to monitor  5,6. Hypertensive retinopathy OU - stable  - as above, may have contributing to CME OD  - discussed importance of tight BP control  - monitor  7. Epiretinal membrane, right eye   - stable nasal ERM  - no indication for surgery at this time  8. Pseudophakia OU  - s/p CE/IOL OU by cataract surgeon in [facility]  - doing well  - monitor  Ophthalmic Meds Ordered this visit:  Meds ordered this encounter  Medications   faricimab-svoa (VABYSMO) 6mg /0.10mL intravitreal injection     Return in about 4 weeks (around 08/01/2022) for f/u BRVO OD, DFE, OCT.  This document serves as a record of services personally performed by Karie Chimera, MD, PhD. It was created on their behalf by Glee Arvin. Manson Passey, OA an ophthalmic technician. The creation of this record is the provider's dictation and/or activities during the visit.    Electronically signed  by: Glee Arvin. Manson Passey, [place] 06.25.2024 4:04 PM  Karie Chimera, M.D., Ph.D. Diseases & Surgery of the Retina and Vitreous [facility]  I have reviewed the above documentation for accuracy and completeness, and I agree with the above. Karie Chimera, M.D., Ph.D. 07/04/22 4:05 PM   Abbreviations: M myopia (nearsighted); A astigmatism; H hyperopia (farsighted); P presbyopia; Mrx spectacle prescription;  CTL contact lenses; OD right eye; OS left eye; OU both eyes  XT exotropia; ET esotropia; PEK punctate epithelial keratitis; PEE punctate epithelial erosions; DES dry eye syndrome; MGD meibomian gland dysfunction; ATs artificial tears; PFAT's preservative free artificial tears; NSC nuclear sclerotic cataract; PSC posterior subcapsular cataract; ERM epi-retinal membrane; PVD posterior vitreous detachment; RD retinal detachment; DM diabetes mellitus; DR diabetic retinopathy; NPDR non-proliferative diabetic retinopathy; PDR proliferative diabetic retinopathy; CSME clinically significant macular edema; DME diabetic macular edema; dbh dot blot hemorrhages; CWS cotton wool spot; POAG primary open angle glaucoma; C/D cup-to-disc ratio; HVF humphrey visual field; GVF goldmann visual field; OCT optical coherence tomography; IOP intraocular pressure; BRVO Branch retinal vein occlusion; CRVO central retinal vein occlusion; CRAO central retinal artery occlusion; BRAO branch retinal artery occlusion; RT retinal tear; SB scleral buckle; PPV pars plana vitrectomy; VH Vitreous hemorrhage; PRP panretinal laser photocoagulation; IVK intravitreal kenalog; VMT vitreomacular traction; MH Macular hole;  NVD neovascularization of the disc; NVE neovascularization elsewhere; AREDS age related eye disease study; ARMD age related macular degeneration; POAG primary open angle glaucoma; EBMD epithelial/anterior basement membrane dystrophy; ACIOL anterior chamber intraocular lens; IOL intraocular lens; PCIOL posterior  chamber intraocular lens; Phaco/IOL phacoemulsification with intraocular lens placement; PRK photorefractive keratectomy; LASIK laser assisted in situ keratomileusis; HTN hypertension; DM diabetes mellitus; COPD chronic obstructive pulmonary disease

## 2022-07-04 ENCOUNTER — Encounter (INDEPENDENT_AMBULATORY_CARE_PROVIDER_SITE_OTHER): Payer: Self-pay | Admitting: Ophthalmology

## 2022-07-04 ENCOUNTER — Ambulatory Visit (INDEPENDENT_AMBULATORY_CARE_PROVIDER_SITE_OTHER): Payer: Medicare Other | Admitting: Ophthalmology

## 2022-07-04 DIAGNOSIS — H34831 Tributary (branch) retinal vein occlusion, right eye, with macular edema: Secondary | ICD-10-CM

## 2022-07-04 DIAGNOSIS — H35371 Puckering of macula, right eye: Secondary | ICD-10-CM

## 2022-07-04 DIAGNOSIS — Z961 Presence of intraocular lens: Secondary | ICD-10-CM

## 2022-07-04 DIAGNOSIS — Z7984 Long term (current) use of oral hypoglycemic drugs: Secondary | ICD-10-CM

## 2022-07-04 DIAGNOSIS — E113213 Type 2 diabetes mellitus with mild nonproliferative diabetic retinopathy with macular edema, bilateral: Secondary | ICD-10-CM | POA: Diagnosis not present

## 2022-07-04 DIAGNOSIS — I10 Essential (primary) hypertension: Secondary | ICD-10-CM

## 2022-07-04 DIAGNOSIS — Z794 Long term (current) use of insulin: Secondary | ICD-10-CM

## 2022-07-04 DIAGNOSIS — H35033 Hypertensive retinopathy, bilateral: Secondary | ICD-10-CM

## 2022-07-04 MED ORDER — FARICIMAB-SVOA 6 MG/0.05ML IZ SOLN
6.0000 mg | INTRAVITREAL | Status: AC | PRN
Start: 2022-07-04 — End: 2022-07-04
  Administered 2022-07-04: 6 mg via INTRAVITREAL

## 2022-07-05 ENCOUNTER — Inpatient Hospital Stay: Payer: Medicare Other | Attending: Internal Medicine

## 2022-07-05 ENCOUNTER — Inpatient Hospital Stay: Payer: Medicare Other

## 2022-07-05 ENCOUNTER — Encounter (INDEPENDENT_AMBULATORY_CARE_PROVIDER_SITE_OTHER): Payer: Medicare Other | Admitting: Ophthalmology

## 2022-07-05 DIAGNOSIS — D649 Anemia, unspecified: Secondary | ICD-10-CM | POA: Diagnosis not present

## 2022-07-05 DIAGNOSIS — N183 Chronic kidney disease, stage 3 unspecified: Secondary | ICD-10-CM

## 2022-07-05 LAB — HEMOGLOBIN AND HEMATOCRIT (CANCER CENTER ONLY)
HCT: 31.8 % — ABNORMAL LOW (ref 36.0–46.0)
Hemoglobin: 10.4 g/dL — ABNORMAL LOW (ref 12.0–15.0)

## 2022-07-05 LAB — SAMPLE TO BLOOD BANK

## 2022-07-19 ENCOUNTER — Encounter (INDEPENDENT_AMBULATORY_CARE_PROVIDER_SITE_OTHER): Payer: Medicare Other

## 2022-07-19 ENCOUNTER — Inpatient Hospital Stay: Payer: Medicare Other

## 2022-07-19 ENCOUNTER — Encounter: Payer: Self-pay | Admitting: Internal Medicine

## 2022-07-19 ENCOUNTER — Ambulatory Visit (INDEPENDENT_AMBULATORY_CARE_PROVIDER_SITE_OTHER): Payer: Medicare Other | Admitting: Vascular Surgery

## 2022-07-26 ENCOUNTER — Inpatient Hospital Stay: Payer: Medicare Other

## 2022-07-26 DIAGNOSIS — D649 Anemia, unspecified: Secondary | ICD-10-CM

## 2022-07-26 DIAGNOSIS — N183 Chronic kidney disease, stage 3 unspecified: Secondary | ICD-10-CM

## 2022-07-26 DIAGNOSIS — D631 Anemia in chronic kidney disease: Secondary | ICD-10-CM

## 2022-07-26 LAB — SAMPLE TO BLOOD BANK

## 2022-07-26 LAB — HEMOGLOBIN AND HEMATOCRIT (CANCER CENTER ONLY)
HCT: 31.3 % — ABNORMAL LOW (ref 36.0–46.0)
Hemoglobin: 10.1 g/dL — ABNORMAL LOW (ref 12.0–15.0)

## 2022-07-26 NOTE — Progress Notes (Addendum)
Retacrit Injection held today. Hgb 10.1

## 2022-07-26 NOTE — Progress Notes (Signed)
[facility] - Clinic Note  08/01/2022     CHIEF COMPLAINT Patient presents for Retina Follow Up   HISTORY OF PRESENT ILLNESS: Cheryl Leonard is a 77 y.o. female who presents to the clinic today for:    HPI     Retina Follow Up   Patient presents with  CRVO/BRVO.  In right eye.  Severity is moderate.  Duration of 4 weeks.  Since onset it is stable.  I, the attending physician,  performed the HPI with the patient and updated documentation appropriately.        Comments   Patient states vision the same OU.      Last edited by [name], MD on 08/01/2022  5:18 PM.    Patient states she has gotten a new glasses prescription.   Referring physician: Danella Penton, MD [phone] [facility] [address] [facility] [place],  [place] [address]   HISTORICAL INFORMATION:   Selected notes from the MEDICAL RECORD NUMBER Referred by Dr. Senaida Ores for concern of DME OD Lab Results  Component Value Date   HGBA1C 7.3 (H) 01/11/2021     CURRENT MEDICATIONS: No current outpatient medications on file. (Ophthalmic Drugs)   No current facility-administered medications for this visit. (Ophthalmic Drugs)   Current Outpatient Medications (Other)  Medication Sig   ALPRAZolam (XANAX) 0.25 MG tablet Take 0.25 mg by mouth daily as needed for anxiety.   aspirin EC 81 MG tablet Take 1 tablet (81 mg total) by mouth daily.   cholecalciferol (VITAMIN D) 1000 units tablet Take 1,000 Units by mouth 2 (two) times daily.   dapagliflozin propanediol (FARXIGA) 5 MG TABS tablet Take 5 mg by mouth daily.   denosumab (PROLIA) 60 MG/ML SOLN injection Inject 60 mg into the skin every 6 (six) months.    escitalopram (LEXAPRO) 10 MG tablet Take 10 mg by mouth daily.   estradiol (ESTRACE) 0.1 MG/GM vaginal cream Place 1 Applicatorful vaginally daily as needed (vaginal irritation).   famotidine (PEPCID) 40 MG tablet Take 40 mg by mouth daily.   furosemide (LASIX) 20 MG  tablet Take 20 mg by mouth every Wednesday.   gabapentin (NEURONTIN) 300 MG capsule Take 300 mg by mouth at bedtime as needed (pain).   HYDROcodone-acetaminophen (NORCO/VICODIN) 5-325 MG tablet Take 1 tablet by mouth daily.   levothyroxine (SYNTHROID, LEVOTHROID) 100 MCG tablet Take 100 mcg by mouth daily before breakfast.    lidocaine-prilocaine (EMLA) cream Apply 1 application topically as needed (apply prior to port a cath access).   Magnesium 500 MG TABS Take 500 mg by mouth every morning.    metoprolol succinate (TOPROL-XL) 50 MG 24 hr tablet Take 50 mg by mouth daily. Take with or immediately following a meal.   mirtazapine (REMERON) 15 MG tablet Take 15 mg by mouth as needed.   Multiple Vitamin (MULTIVITAMIN WITH MINERALS) TABS tablet Take 1 tablet by mouth daily. Centrum Silver   NOVOLOG FLEXPEN 100 UNIT/ML FlexPen SMARTSIG:8 Unit(s) SUB-Q Morning-Evening   nystatin cream (MYCOSTATIN) Apply 1 application topically daily as needed (Yeast infection).   olmesartan (BENICAR) 20 MG tablet Take 20 mg by mouth daily.   pantoprazole (PROTONIX) 40 MG tablet Take 40 mg by mouth daily.   rivaroxaban (XARELTO) 20 MG TABS tablet Take 1 tablet (20 mg total) by mouth daily with supper.   rosuvastatin (CRESTOR) 20 MG tablet Take 20 mg by mouth every morning.   sodium bicarbonate 650 MG tablet Take 650 mg by mouth  2 (two) times daily.   TRESIBA FLEXTOUCH 200 UNIT/ML SOPN Inject 25-30 Units as directed at bedtime. Titrate according to fasting blood glucose not to exceed 50 units a day   vitamin B-12 (CYANOCOBALAMIN) 1000 MCG tablet Take 1,000 mcg by mouth daily.   vitamin C (ASCORBIC ACID) 250 MG tablet Take 250 mg by mouth daily.   vitamin E 400 UNIT capsule Take 400 Units by mouth daily.   Zinc Sulfate (ZINC 15 PO) Take 1 tablet by mouth daily.   zolpidem (AMBIEN) 10 MG tablet Take 10 mg by mouth at bedtime.   No current facility-administered medications for this visit. (Other)   REVIEW OF  SYSTEMS: ROS   Positive for: Gastrointestinal, Musculoskeletal, Endocrine, Cardiovascular, Eyes, Respiratory Negative for: Constitutional, Neurological, Skin, Genitourinary, HENT, Psychiatric, Allergic/Imm, Heme/Lymph Last edited by Doreene Nest, COT on 08/01/2022  2:17 PM.       ALLERGIES No Known Allergies  PAST MEDICAL HISTORY Past Medical History:  Diagnosis Date   Anemia    Anxiety    Arthritis    Gout   Cataracts, both eyes    Diabetic retinopathy (HCC)    NPDR OU   Diverticulitis    GERD (gastroesophageal reflux disease)    Gout    Headache    h/o migraines   History of fracture of patella    right knee   History of positive PPD    Patient always shows positive   Hyperlipidemia    Hypertension    Hypertensive retinopathy    OU   Hypothyroidism    Lichen sclerosus 12/30/2013   of vulva   Metatarsal fracture    Neuropathy    Osteopenia    Peripheral vascular disease (HCC)    Polyneuropathy    numbness and tingling in feet and toes   Renal insufficiency    Stage 3   Sleep apnea    does not use cpap-lost weight    Type 2 diabetes mellitus, uncontrolled    Past Surgical History:  Procedure Laterality Date   ABDOMINAL HYSTERECTOMY     AMPUTATION TOE Right 05/08/2020   Procedure: AMPUTATION TOE-Right 4th Toe;  Surgeon: Rosetta Posner, DPM;  Location: [facility];  Service: Podiatry;  Laterality: Right;   AMPUTATION TOE Left 04/22/2021   Procedure: AMPUTATION TOE - 4TH METARSOPHANGEAL JOINT;  Surgeon: Rosetta Posner, DPM;  Location: [facility];  Service: Podiatry;  Laterality: Left;   APPENDECTOMY     BREAST REDUCTION SURGERY  2001   CATARACT EXTRACTION     CESAREAN SECTION  1976   COLONOSCOPY  03/05/2013   Nml - due for repeat 03/06/2018   COLONOSCOPY WITH PROPOFOL N/A 03/18/2019   Procedure: COLONOSCOPY WITH PROPOFOL;  Surgeon: Toledo, [name], MD;  Location: [facility];  Service: Gastroenterology;  Laterality: N/A;   DIAGNOSTIC LAPAROSCOPY     DILATION  AND CURETTAGE OF UTERUS  1989   ENDARTERECTOMY FEMORAL Bilateral 03/09/2018   Procedure: ENDARTERECTOMY FEMORAL;  Surgeon: Annice Needy, MD;  Location: [facility];  Service: Vascular;  Laterality: Bilateral;   ENDARTERECTOMY POPLITEAL Left 03/09/2018   Procedure: ENDARTERECTOMY POPLITEAL AND SFA;  Surgeon: Annice Needy, MD;  Location: [facility];  Service: Vascular;  Laterality: Left;   ESOPHAGOGASTRODUODENOSCOPY  03/05/2013   ESOPHAGOGASTRODUODENOSCOPY (EGD) WITH PROPOFOL N/A 03/18/2019   Procedure: ESOPHAGOGASTRODUODENOSCOPY (EGD) WITH PROPOFOL;  Surgeon: Toledo, [name], MD;  Location: [facility];  Service: Gastroenterology;  Laterality: N/A;   EYE SURGERY     Eyelid Surgery  2012  INTRAMEDULLARY (IM) NAIL INTERTROCHANTERIC Left 10/30/2015   Procedure: INTRAMEDULLARY (IM) NAIL INTERTROCHANTRIC ;  Surgeon: Kennedy Bucker, MD;  Location: [facility];  Service: Orthopedics;  Laterality: Left;   KYPHOPLASTY N/A 10/25/2018   Procedure: L4 KYPHOPLASTY;  Surgeon: Kennedy Bucker, MD;  Location: [facility];  Service: Orthopedics;  Laterality: N/A;   LAPAROSCOPIC HYSTERECTOMY  2000   total   LOWER EXTREMITY ANGIOGRAPHY Left 03/08/2017   Procedure: LOWER EXTREMITY ANGIOGRAPHY;  Surgeon: Annice Needy, MD;  Location: [facility];  Service: Cardiovascular;  Laterality: Left;   LOWER EXTREMITY ANGIOGRAPHY Left 10/30/2017   Procedure: LOWER EXTREMITY ANGIOGRAPHY;  Surgeon: Annice Needy, MD;  Location: [facility];  Service: Cardiovascular;  Laterality: Left;   LOWER EXTREMITY ANGIOGRAPHY Right 03/08/2018   Procedure: LOWER EXTREMITY ANGIOGRAPHY;  Surgeon: Annice Needy, MD;  Location: [facility];  Service: Cardiovascular;  Laterality: Right;   LOWER EXTREMITY ANGIOGRAPHY Left 10/01/2018   Procedure: LOWER EXTREMITY ANGIOGRAPHY;  Surgeon: Annice Needy, MD;  Location: [facility];  Service: Cardiovascular;  Laterality: Left;   LOWER EXTREMITY ANGIOGRAPHY Right 10/08/2018   Procedure:  LOWER EXTREMITY ANGIOGRAPHY;  Surgeon: Annice Needy, MD;  Location: [facility];  Service: Cardiovascular;  Laterality: Right;   LOWER EXTREMITY ANGIOGRAPHY Right 05/07/2020   Procedure: Lower Extremity Angiography;  Surgeon: Annice Needy, MD;  Location: [facility];  Service: Cardiovascular;  Laterality: Right;   LYSIS OF ADHESION  01/25/2021   Procedure: LYSIS OF ADHESION;  Surgeon: Campbell Lerner, MD;  Location: [facility];  Service: General;;   PERIPHERAL VASCULAR INTERVENTION  03/08/2018   Procedure: PERIPHERAL VASCULAR INTERVENTION;  Surgeon: Annice Needy, MD;  Location: [facility];  Service: Cardiovascular;;   PORTA CATH INSERTION N/A 02/17/2020   Procedure: PORTA CATH INSERTION;  Surgeon: Annice Needy, MD;  Location: [facility];  Service: Cardiovascular;  Laterality: N/A;   REDUCTION MAMMAPLASTY  1997   SACROPLASTY N/A 10/25/2018   Procedure: S1 SACROPLASTY;  Surgeon: Kennedy Bucker, MD;  Location: [facility];  Service: Orthopedics;  Laterality: N/A;   FAMILY HISTORY Family History  Problem Relation Age of Onset   Coronary artery disease Father    Heart attack Father    Coronary artery disease Mother    Heart attack Mother    Ovarian cancer Sister 59       sister had hormonal therapy for IVF txs-which increased risk factor for ovarian cancer   Breast cancer Neg Hx    SOCIAL HISTORY Social History   Tobacco Use   Smoking status: Former    Current packs/day: 0.00    Average packs/day: 1 pack/day for 20.0 years (20.0 ttl pk-yrs)    Types: Cigarettes    Start date: 03/07/1976    Quit date: 03/07/1996    Years since quitting: 26.4    Passive exposure: Past   Smokeless tobacco: Never   Tobacco comments:    started smoking at age 26 but stopped smoking in 2000  Vaping Use   Vaping status: Never Used  Substance Use Topics   Alcohol use: No    Alcohol/week: 0.0 standard drinks of alcohol   Drug use: No       OPHTHALMIC EXAM: Base Eye Exam      Visual Acuity (Snellen - Linear)       Right Left   Dist [place] 20/30 20/20 -1   Dist ph [place] NI  Tonometry (Tonopen, 2:24 PM)       Right Left   Pressure 12 10         Visual Fields (Counting fingers)       Left Right    Full Full         Extraocular Movement       Right Left    Full, Ortho Full, Ortho         Neuro/Psych     Oriented x3: Yes   Mood/Affect: Normal         Dilation     Both eyes: 1.0% Mydriacyl, 2.5% Phenylephrine @ 2:24 PM           Slit Lamp and Fundus Exam     External Exam       Right Left   External Normal Normal         Slit Lamp Exam       Right Left   Lids/Lashes dermatochalasis dermatochalasis   Conjunctiva/Sclera White and quiet White and quiet   Cornea arcus; well healed cataract wound; 2-3+ diffuse Punctate epithelial erosions, decreased TBUT, mild Anterior basement membrane dystrophy superiorly arcus; well healed cataract wound, 2-3+ diffuse Punctate epithelial erosions, irregualr epi surface, decreased TBUT   Anterior Chamber Deep and quiet Deep and quiet   Iris Round and dilated Round and dilated   Lens PCIOL; open PC PCIOL; open PC   Anterior Vitreous syneresis, Posterior vitreous detachment, vitreous condensations inferiorly syneresis, Posterior vitreous detachment         Fundus Exam       Right Left   Disc Superior hyperemia, mild Pallor Pink and Sharp   C/D Ratio 0.6 0.5   Macula Flat, Blunted foveal reflex, +cystic changes, +Epiretinal membrane, minimal MA flat; good foveal reflex, no heme or edema, small pigment clump IT to fovea   Vessels attenuated, Tortuous attenuated, Tortuous   Periphery Attached; scattered DBH -- greastest temporal periphery Attached, no heme           IMAGING AND PROCEDURES  Imaging and Procedures for 04/25/17  OCT, Retina - OU - Both Eyes       Right Eye Quality was good. Central Foveal Thickness: 328. Progression has worsened. Findings include no IRF, no  SRF, abnormal foveal contour, epiretinal membrane (Mild interval increase in IRF / cystic changes temporal fovea and macula).   Left Eye Quality was good. Central Foveal Thickness: 291. Progression has been stable. Findings include normal foveal contour, no IRF, no SRF.   Notes *Images captured and stored on drive  Diagnosis / Impression:  OD: BRVO w/ CME -- Mild interval increase in IRF / cystic changes temporal fovea and macula OS: NFP; no IRF/SRF--stable  Clinical management:  See below  Abbreviations: NFP - Normal foveal profile. CME - cystoid macular edema. PED - pigment epithelial detachment. IRF - intraretinal fluid. SRF - subretinal fluid. EZ - ellipsoid zone. ERM - epiretinal membrane. ORA - outer retinal atrophy. ORT - outer retinal tubulation. SRHM - subretinal hyper-reflective material      Intravitreal Injection, Pharmacologic Agent - OD - Right Eye       Time Out 08/01/2022. 2:48 PM. Confirmed correct patient, procedure, site, and patient consented.   Anesthesia Topical anesthesia was used. Anesthetic medications included Lidocaine 2%, Proparacaine 0.5%.   Procedure Preparation included 5% betadine to ocular surface, eyelid speculum. A (32 g) needle was used.   Injection: 6 mg faricimab-svoa 6 MG/0.05ML   Route: Intravitreal, Site: Right Eye  NDC: O8010301, Lot: [id number], Expiration date: 09/02/2024, Waste: 0 mL   Post-op Post injection exam found visual acuity of at least counting fingers. The patient tolerated the procedure well. There were no complications. The patient received written and verbal post procedure care education. Post injection medications were not given.            ASSESSMENT/PLAN:    ICD-10-CM   1. Branch retinal vein occlusion of right eye with macular edema  H34.8310 OCT, Retina - OU - Both Eyes    Intravitreal Injection, Pharmacologic Agent - OD - Right Eye    faricimab-svoa (VABYSMO) 6mg /0.87mL intravitreal injection    2.  Both eyes affected by mild nonproliferative diabetic retinopathy with macular edema, associated with type 2 diabetes mellitus (HCC)  Y86.5784     3. Long term (current) use of oral hypoglycemic drugs  Z79.84     4. Current use of insulin (HCC)  Z79.4     5. Essential hypertension  I10     6. Hypertensive retinopathy of both eyes  H35.033     7. Epiretinal membrane (ERM) of right eye  H35.371     8. Pseudophakia of both eyes  Z96.1      1. BRVO w/ CME OD - by history, pt states symptoms first noticed 2 wks prior to presentation, but reports changes may have occurred prior  - initial exam with differential tortuosity of vessels (OD > OS) - FA (02.10.20) shows mild late staining / leakage in macula, staining / leakage of disc -- improving CME - differential includes DM2 (DME), hypertensive retinopathy, inflammatory etiology / uveitis - S/P IVA OD #1 (02.08.19), #2 (03.11.19), #3 (04.09.19), #4 (05.20.19), #5 (02.10.20) - gave IVA OD on 2.10.20 due to pending Eylea4U for 2020 -- resulted in increased IRF/CME - review of OCTs show persistent IRF and cystic changes --  resistance to IVA  - June 2019 -- switched therapies:  ========================================================== - S/P IVE OD #1 (06.24.19), #2 (07.24.19), #3 (09.04.19), #4 (10.30.19),#5 (12.30.19), #6 (03.23.20), #7 (05.05.20), #8 (07.16.20), #9 (07.17.20), #10 (08.28.20), #11 (10.13.20), # 12 (11.17.20), #13 (2.8.21), #14 (03.09.21), #15 (04.13.21), #16 (05.11.21), #17 (06.17.21), #18 (07.23.21), #19 (08.30.21), #20 (10.04.21), #21 (11.08.21), #22 (12.08.21), #23 (01.31.22), #24 (02.28.22), #25 (04.01.22), #26 (06.15.22), #27 (07.13.22), #28 (08.17.22), #29 (09.21.22), #30 (10.19.22), #31 (11.16.22), #32 (12.16.22), #33 (01.13.23), #34 (02.10.23), #35 (03.15.23), #36 (04.14.23), #37 (05.15.23), #38 (06.12.23), #39 (07.10.23), #40 (08.07.23), #41 (09.06.23), #42 (10.04.23), #43 (11.01.23) -- IVE  resistance ========================================================== **interval increase in IRF at 5 weeks on 07.01.24 exam (IVV)** - s/p IVV OD #1 (12.04.23), #2 (01.03.24), #3 (01.31.24), #4 (02.28.24), #5 (03.29.24), #6 (04.24.24), #7 (05.28.24), #8 (07.01.24) - OCT today shows Mild interval increase in IRF / cystic changes temporal fovea and macula at 4 wks  - BCVA 20/30 -- stable  - recommend IVV OD #9 today, 07.29.24 w/ f/u in 4 weeks  - RBA of procedure discussed, questions answered  - informed consent obtained  - see procedure note   - Vabysmo informed consent form obtained, signed and scanned on 12.04.23  - f/u 4 weeks  -- DFE/OCT/possible injection  2-4. Mild nonproliferative diabetic retinopathy, both eyes - A1c 6.5 on 7.25.24, 6.2 on 03.14.24, 6.2 on 09.12.23; 6.6 on 07.01.23; 6.0 on 10.14.22  - could be contributing to CME OD  - OS with minimal diabetic retinopathy  - continue to monitor  5,6. Hypertensive retinopathy OU - stable  - as above, may have contributing to CME OD  -  discussed importance of tight BP control  - monitor  7. Epiretinal membrane, right eye   - stable nasal ERM  - no indication for surgery at this time  8. Pseudophakia OU  - s/p CE/IOL OU by cataract surgeon in [facility]  - doing well  - monitor  Ophthalmic Meds Ordered this visit:  Meds ordered this encounter  Medications   faricimab-svoa (VABYSMO) 6mg /0.82mL intravitreal injection     Return in about 4 weeks (around 08/29/2022) for f/u BRVO OD, DFE, OCT, Possible Injxn.  This document serves as a record of services personally performed by Karie Chimera, MD, PhD. It was created on their behalf by De Blanch, an ophthalmic technician. The creation of this record is the provider's dictation and/or activities during the visit.    Electronically signed by: De Blanch, OA, 08/01/22  5:20 PM  This document serves as a record of services personally performed by Karie Chimera, MD, PhD.  It was created on their behalf by Gerilyn Nestle, COT an ophthalmic technician. The creation of this record is the provider's dictation and/or activities during the visit.    Electronically signed by:  Charlette Caffey, COT  08/01/22 5:20 PM  Karie Chimera, M.D., Ph.D. Diseases & Surgery of the Retina and Vitreous [facility]  I have reviewed the above documentation for accuracy and completeness, and I agree with the above. Karie Chimera, M.D., Ph.D. 08/01/22 5:20 PM   Abbreviations: M myopia (nearsighted); A astigmatism; H hyperopia (farsighted); P presbyopia; Mrx spectacle prescription;  CTL contact lenses; OD right eye; OS left eye; OU both eyes  XT exotropia; ET esotropia; PEK punctate epithelial keratitis; PEE punctate epithelial erosions; DES dry eye syndrome; MGD meibomian gland dysfunction; ATs artificial tears; PFAT's preservative free artificial tears; NSC nuclear sclerotic cataract; PSC posterior subcapsular cataract; ERM epi-retinal membrane; PVD posterior vitreous detachment; RD retinal detachment; DM diabetes mellitus; DR diabetic retinopathy; NPDR non-proliferative diabetic retinopathy; PDR proliferative diabetic retinopathy; CSME clinically significant macular edema; DME diabetic macular edema; dbh dot blot hemorrhages; CWS cotton wool spot; POAG primary open angle glaucoma; C/D cup-to-disc ratio; HVF humphrey visual field; GVF goldmann visual field; OCT optical coherence tomography; IOP intraocular pressure; BRVO Branch retinal vein occlusion; CRVO central retinal vein occlusion; CRAO central retinal artery occlusion; BRAO branch retinal artery occlusion; RT retinal tear; SB scleral buckle; PPV pars plana vitrectomy; VH Vitreous hemorrhage; PRP panretinal laser photocoagulation; IVK intravitreal kenalog; VMT vitreomacular traction; MH Macular hole;  NVD neovascularization of the disc; NVE neovascularization elsewhere; AREDS age related eye disease study;  ARMD age related macular degeneration; POAG primary open angle glaucoma; EBMD epithelial/anterior basement membrane dystrophy; ACIOL anterior chamber intraocular lens; IOL intraocular lens; PCIOL posterior chamber intraocular lens; Phaco/IOL phacoemulsification with intraocular lens placement; PRK photorefractive keratectomy; LASIK laser assisted in situ keratomileusis; HTN hypertension; DM diabetes mellitus; COPD chronic obstructive pulmonary disease

## 2022-08-01 ENCOUNTER — Encounter (INDEPENDENT_AMBULATORY_CARE_PROVIDER_SITE_OTHER): Payer: Self-pay | Admitting: Ophthalmology

## 2022-08-01 ENCOUNTER — Ambulatory Visit (INDEPENDENT_AMBULATORY_CARE_PROVIDER_SITE_OTHER): Payer: Medicare Other | Admitting: Ophthalmology

## 2022-08-01 DIAGNOSIS — H35371 Puckering of macula, right eye: Secondary | ICD-10-CM

## 2022-08-01 DIAGNOSIS — Z961 Presence of intraocular lens: Secondary | ICD-10-CM

## 2022-08-01 DIAGNOSIS — I10 Essential (primary) hypertension: Secondary | ICD-10-CM

## 2022-08-01 DIAGNOSIS — Z794 Long term (current) use of insulin: Secondary | ICD-10-CM | POA: Diagnosis not present

## 2022-08-01 DIAGNOSIS — E113213 Type 2 diabetes mellitus with mild nonproliferative diabetic retinopathy with macular edema, bilateral: Secondary | ICD-10-CM | POA: Diagnosis not present

## 2022-08-01 DIAGNOSIS — H34831 Tributary (branch) retinal vein occlusion, right eye, with macular edema: Secondary | ICD-10-CM | POA: Diagnosis not present

## 2022-08-01 DIAGNOSIS — Z7984 Long term (current) use of oral hypoglycemic drugs: Secondary | ICD-10-CM | POA: Diagnosis not present

## 2022-08-01 DIAGNOSIS — H35033 Hypertensive retinopathy, bilateral: Secondary | ICD-10-CM

## 2022-08-01 MED ORDER — FARICIMAB-SVOA 6 MG/0.05ML IZ SOLN
6.0000 mg | INTRAVITREAL | Status: AC | PRN
Start: 2022-08-01 — End: 2022-08-01
  Administered 2022-08-01: 6 mg via INTRAVITREAL

## 2022-08-02 ENCOUNTER — Inpatient Hospital Stay: Payer: Medicare Other

## 2022-08-09 ENCOUNTER — Inpatient Hospital Stay: Payer: Medicare Other | Attending: Internal Medicine

## 2022-08-09 ENCOUNTER — Inpatient Hospital Stay: Payer: Medicare Other

## 2022-08-09 DIAGNOSIS — N184 Chronic kidney disease, stage 4 (severe): Secondary | ICD-10-CM | POA: Diagnosis not present

## 2022-08-09 DIAGNOSIS — Z87891 Personal history of nicotine dependence: Secondary | ICD-10-CM | POA: Insufficient documentation

## 2022-08-09 DIAGNOSIS — Z89421 Acquired absence of other right toe(s): Secondary | ICD-10-CM | POA: Diagnosis not present

## 2022-08-09 DIAGNOSIS — M79641 Pain in right hand: Secondary | ICD-10-CM | POA: Insufficient documentation

## 2022-08-09 DIAGNOSIS — Z8041 Family history of malignant neoplasm of ovary: Secondary | ICD-10-CM | POA: Diagnosis not present

## 2022-08-09 DIAGNOSIS — E1122 Type 2 diabetes mellitus with diabetic chronic kidney disease: Secondary | ICD-10-CM | POA: Diagnosis not present

## 2022-08-09 DIAGNOSIS — D649 Anemia, unspecified: Secondary | ICD-10-CM | POA: Diagnosis present

## 2022-08-09 DIAGNOSIS — M7989 Other specified soft tissue disorders: Secondary | ICD-10-CM | POA: Insufficient documentation

## 2022-08-09 DIAGNOSIS — D509 Iron deficiency anemia, unspecified: Secondary | ICD-10-CM | POA: Diagnosis not present

## 2022-08-09 DIAGNOSIS — Z7901 Long term (current) use of anticoagulants: Secondary | ICD-10-CM | POA: Insufficient documentation

## 2022-08-16 ENCOUNTER — Inpatient Hospital Stay: Payer: Medicare Other

## 2022-08-16 MED FILL — Iron Sucrose Inj 20 MG/ML (Fe Equiv): INTRAVENOUS | Qty: 10 | Status: AC

## 2022-08-17 ENCOUNTER — Inpatient Hospital Stay (HOSPITAL_BASED_OUTPATIENT_CLINIC_OR_DEPARTMENT_OTHER): Payer: Medicare Other | Admitting: Internal Medicine

## 2022-08-17 ENCOUNTER — Encounter: Payer: Self-pay | Admitting: Internal Medicine

## 2022-08-17 ENCOUNTER — Inpatient Hospital Stay: Payer: Medicare Other

## 2022-08-17 DIAGNOSIS — D509 Iron deficiency anemia, unspecified: Secondary | ICD-10-CM | POA: Diagnosis not present

## 2022-08-17 DIAGNOSIS — D649 Anemia, unspecified: Secondary | ICD-10-CM

## 2022-08-17 LAB — CBC WITH DIFFERENTIAL (CANCER CENTER ONLY)
Abs Immature Granulocytes: 0.01 10*3/uL (ref 0.00–0.07)
Basophils Absolute: 0 10*3/uL (ref 0.0–0.1)
Basophils Relative: 1 %
Eosinophils Absolute: 0.1 10*3/uL (ref 0.0–0.5)
Eosinophils Relative: 2 %
HCT: 32.4 % — ABNORMAL LOW (ref 36.0–46.0)
Hemoglobin: 10.3 g/dL — ABNORMAL LOW (ref 12.0–15.0)
Immature Granulocytes: 0 %
Lymphocytes Relative: 18 %
Lymphs Abs: 0.8 10*3/uL (ref 0.7–4.0)
MCH: 29.9 pg (ref 26.0–34.0)
MCHC: 31.8 g/dL (ref 30.0–36.0)
MCV: 94.2 fL (ref 80.0–100.0)
Monocytes Absolute: 0.7 10*3/uL (ref 0.1–1.0)
Monocytes Relative: 16 %
Neutro Abs: 2.9 10*3/uL (ref 1.7–7.7)
Neutrophils Relative %: 63 %
Platelet Count: 327 10*3/uL (ref 150–400)
RBC: 3.44 MIL/uL — ABNORMAL LOW (ref 3.87–5.11)
RDW: 15.7 % — ABNORMAL HIGH (ref 11.5–15.5)
WBC Count: 4.6 10*3/uL (ref 4.0–10.5)
nRBC: 0 % (ref 0.0–0.2)

## 2022-08-17 LAB — BASIC METABOLIC PANEL
Anion gap: 8 (ref 5–15)
BUN: 24 mg/dL — ABNORMAL HIGH (ref 8–23)
CO2: 21 mmol/L — ABNORMAL LOW (ref 22–32)
Calcium: 8 mg/dL — ABNORMAL LOW (ref 8.9–10.3)
Chloride: 107 mmol/L (ref 98–111)
Creatinine, Ser: 1.48 mg/dL — ABNORMAL HIGH (ref 0.44–1.00)
GFR, Estimated: 36 mL/min — ABNORMAL LOW (ref >60)
Glucose, Bld: 269 mg/dL — ABNORMAL HIGH (ref 70–99)
Potassium: 4.5 mmol/L (ref 3.5–5.1)
Sodium: 136 mmol/L (ref 135–145)

## 2022-08-17 LAB — IRON AND TIBC
Iron: 115 ug/dL (ref 28–170)
Saturation Ratios: 36 % — ABNORMAL HIGH (ref 10.4–31.8)
TIBC: 316 ug/dL (ref 250–450)
UIBC: 201 ug/dL

## 2022-08-17 LAB — FERRITIN: Ferritin: 15 ng/mL (ref 11–307)

## 2022-08-17 MED ORDER — HEPARIN SOD (PORK) LOCK FLUSH 100 UNIT/ML IV SOLN
500.0000 [IU] | Freq: Once | INTRAVENOUS | Status: AC
  Administered 2022-08-17: 500 [IU] via INTRAVENOUS
  Filled 2022-08-17: qty 5

## 2022-08-17 NOTE — Progress Notes (Signed)
Fatigue/weakness: yes Dyspena: no Light headedness: no Blood in stool: no   C/o pain in right hand and swelling x2 days. Going to make appt with Dr. Rosita Kea in [facility] ortho.  Trouble sleeping due to neuropathy, takes [place].

## 2022-08-17 NOTE — Patient Instructions (Signed)
Recommend Ice pack; and voltaren gel on topical for right hand twice a day.

## 2022-08-17 NOTE — Progress Notes (Signed)
[facility] CONSULT NOTE  Patient Care Team: Danella Penton, MD as PCP - General (Internal Medicine) Elnita Maxwell, MD as Referring Physician (Gastroenterology) Earna Coder, MD as Consulting Physician (Hematology and Oncology)  CHIEF COMPLAINTS/PURPOSE OF CONSULTATION: Anemia  HEMATOLOGY HISTORY  # ANEMIA- Jan 2021- 8.8/ferritin 11 [PCP]; N-WBC/platelets? IDA vs other- EGD-2015/colonoscopy-? 2015; 2020- [Dr.Skulskie] ; capsule-2016- ? 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   HISTORY OF PRESENTING ILLNESS: Ambulating independently.  With husband.   Cheryl Leonard 77 y.o.  female anemia iron deficiency-question CKD-III  here for follow-up.   C/o pain in right hand and swelling x2 days. Going to make appt with Dr. Rosita Kea in [facility] ortho.  C/o of ongoing Fatigue. No visible blood in stool. Dyspnea with exertion. No falls. Uses cane.   Review of Systems  Constitutional:  Positive for malaise/fatigue. Negative for chills, diaphoresis and fever.  HENT:  Negative for nosebleeds and sore throat.   Eyes:  Negative for double vision.  Respiratory:  Positive for shortness of breath. Negative for cough, hemoptysis, sputum production and wheezing.   Cardiovascular:  Negative for chest pain, palpitations, orthopnea and leg swelling.  Gastrointestinal:  Negative for abdominal pain, blood in stool, constipation, diarrhea, heartburn, melena, nausea and vomiting.  Genitourinary:  Negative for dysuria, frequency and urgency.  Musculoskeletal:  Positive for joint pain.  Skin: Negative.  Negative for itching and rash.  Neurological:  Negative for dizziness, tingling, focal weakness, weakness and headaches.  Endo/Heme/Allergies:  Does not bruise/bleed easily.  Psychiatric/Behavioral:  Negative for  depression. The patient is not nervous/anxious and does not have insomnia.     MEDICAL HISTORY:  Past Medical History:  Diagnosis Date   Anemia    Anxiety    Arthritis    Gout   Cataracts, both eyes    Diabetic retinopathy (HCC)    NPDR OU   Diverticulitis    GERD (gastroesophageal reflux disease)    Gout    Headache    h/o migraines   History of fracture of patella    right knee   History of positive PPD    Patient always shows positive   Hyperlipidemia    Hypertension    Hypertensive retinopathy    OU   Hypothyroidism    Lichen sclerosus 12/30/2013   of vulva   Metatarsal fracture    Neuropathy    Osteopenia    Peripheral vascular disease (HCC)    Polyneuropathy    numbness and tingling in feet and toes   Renal insufficiency    Stage 3   Sleep apnea    does not use cpap-lost weight    Type 2 diabetes mellitus, uncontrolled     SURGICAL HISTORY: Past Surgical History:  Procedure Laterality Date   ABDOMINAL HYSTERECTOMY     AMPUTATION TOE Right 05/08/2020   Procedure: AMPUTATION TOE-Right 4th Toe;  Surgeon: Rosetta Posner, DPM;  Location: [facility];  Service: Podiatry;  Laterality: Right;   AMPUTATION TOE Left 04/22/2021   Procedure: AMPUTATION TOE - 4TH METARSOPHANGEAL JOINT;  Surgeon: Rosetta Posner, DPM;  Location: [facility];  Service: Podiatry;  Laterality: Left;   APPENDECTOMY     BREAST REDUCTION SURGERY  2001   CATARACT EXTRACTION     CESAREAN SECTION  1976   COLONOSCOPY  03/05/2013  Nml - due for repeat 03/06/2018   COLONOSCOPY WITH PROPOFOL N/A 03/18/2019   Procedure: COLONOSCOPY WITH PROPOFOL;  Surgeon: Toledo, [name], MD;  Location: [facility];  Service: Gastroenterology;  Laterality: N/A;   DIAGNOSTIC LAPAROSCOPY     DILATION AND CURETTAGE OF UTERUS  1989   ENDARTERECTOMY FEMORAL Bilateral 03/09/2018   Procedure: ENDARTERECTOMY FEMORAL;  Surgeon: Annice Needy, MD;  Location: [facility];  Service: Vascular;  Laterality: Bilateral;   ENDARTERECTOMY  POPLITEAL Left 03/09/2018   Procedure: ENDARTERECTOMY POPLITEAL AND SFA;  Surgeon: Annice Needy, MD;  Location: [facility];  Service: Vascular;  Laterality: Left;   ESOPHAGOGASTRODUODENOSCOPY  03/05/2013   ESOPHAGOGASTRODUODENOSCOPY (EGD) WITH PROPOFOL N/A 03/18/2019   Procedure: ESOPHAGOGASTRODUODENOSCOPY (EGD) WITH PROPOFOL;  Surgeon: Toledo, [name], MD;  Location: [facility];  Service: Gastroenterology;  Laterality: N/A;   EYE SURGERY     Eyelid Surgery  2012   INTRAMEDULLARY (IM) NAIL INTERTROCHANTERIC Left 10/30/2015   Procedure: INTRAMEDULLARY (IM) NAIL INTERTROCHANTRIC ;  Surgeon: Kennedy Bucker, MD;  Location: [facility];  Service: Orthopedics;  Laterality: Left;   KYPHOPLASTY N/A 10/25/2018   Procedure: L4 KYPHOPLASTY;  Surgeon: Kennedy Bucker, MD;  Location: [facility];  Service: Orthopedics;  Laterality: N/A;   LAPAROSCOPIC HYSTERECTOMY  2000   total   LOWER EXTREMITY ANGIOGRAPHY Left 03/08/2017   Procedure: LOWER EXTREMITY ANGIOGRAPHY;  Surgeon: Annice Needy, MD;  Location: [facility];  Service: Cardiovascular;  Laterality: Left;   LOWER EXTREMITY ANGIOGRAPHY Left 10/30/2017   Procedure: LOWER EXTREMITY ANGIOGRAPHY;  Surgeon: Annice Needy, MD;  Location: [facility];  Service: Cardiovascular;  Laterality: Left;   LOWER EXTREMITY ANGIOGRAPHY Right 03/08/2018   Procedure: LOWER EXTREMITY ANGIOGRAPHY;  Surgeon: Annice Needy, MD;  Location: [facility];  Service: Cardiovascular;  Laterality: Right;   LOWER EXTREMITY ANGIOGRAPHY Left 10/01/2018   Procedure: LOWER EXTREMITY ANGIOGRAPHY;  Surgeon: Annice Needy, MD;  Location: [facility];  Service: Cardiovascular;  Laterality: Left;   LOWER EXTREMITY ANGIOGRAPHY Right 10/08/2018   Procedure: LOWER EXTREMITY ANGIOGRAPHY;  Surgeon: Annice Needy, MD;  Location: [facility];  Service: Cardiovascular;  Laterality: Right;   LOWER EXTREMITY ANGIOGRAPHY Right 05/07/2020   Procedure: Lower Extremity Angiography;   Surgeon: Annice Needy, MD;  Location: [facility];  Service: Cardiovascular;  Laterality: Right;   LYSIS OF ADHESION  01/25/2021   Procedure: LYSIS OF ADHESION;  Surgeon: Campbell Lerner, MD;  Location: [facility];  Service: General;;   PERIPHERAL VASCULAR INTERVENTION  03/08/2018   Procedure: PERIPHERAL VASCULAR INTERVENTION;  Surgeon: Annice Needy, MD;  Location: [facility];  Service: Cardiovascular;;   PORTA CATH INSERTION N/A 02/17/2020   Procedure: PORTA CATH INSERTION;  Surgeon: Annice Needy, MD;  Location: [facility];  Service: Cardiovascular;  Laterality: N/A;   REDUCTION MAMMAPLASTY  1997   SACROPLASTY N/A 10/25/2018   Procedure: S1 SACROPLASTY;  Surgeon: Kennedy Bucker, MD;  Location: [facility];  Service: Orthopedics;  Laterality: N/A;    SOCIAL HISTORY: Social History   Socioeconomic History   Marital status: Married    Spouse name: [name]   Number of children: 3   Years of education: Not on file   Highest education level: Not on file  Occupational History   Occupation: Barrister's clerk  Tobacco Use   Smoking status: Former    Current packs/day: 0.00    Average packs/day: 1 pack/day for 20.0 years (20.0 ttl  pk-yrs)    Types: Cigarettes    Start date: 03/07/1976    Quit date: 03/07/1996    Years since quitting: 26.4    Passive exposure: Past   Smokeless tobacco: Never   Tobacco comments:    started smoking at age 67 but stopped smoking in 2000  Vaping Use   Vaping status: Never Used  Substance and Sexual Activity   Alcohol use: No    Alcohol/week: 0.0 standard drinks of alcohol   Drug use: No   Sexual activity: Yes    Partners: Male    Birth control/protection: Surgical  Other Topics Concern   Not on file  Social History Narrative   Lives in [place]; with husband; quit > 20 years; no alcohol; used to work at [employer]  at [employer].    Social Determinants of Health   Financial Resource Strain: Low Risk  (10/08/2018)   Overall  Financial Resource Strain (CARDIA)    Difficulty of Paying Living Expenses: Not very hard  Food Insecurity: No Food Insecurity (10/08/2018)   Hunger Vital Sign    Worried About Running Out of Food in the Last Year: Never true    Ran Out of Food in the Last Year: Never true  Transportation Needs: Unknown (10/01/2018)   PRAPARE - Administrator, Civil Service (Medical): No    Lack of Transportation (Non-Medical): Not on file  Physical Activity: Unknown (10/01/2018)   Exercise Vital Sign    Days of Exercise per Week: 2 days    Minutes of Exercise per Session: Not on file  Stress: Stress Concern Present (10/01/2018)   [employer] of Occupational Health - Occupational Stress Questionnaire    Feeling of Stress : To some extent  Social Connections: Unknown (10/08/2018)   Social Connection and Isolation Panel [NHANES]    Frequency of Communication with Friends and Family: More than three times a week    Frequency of Social Gatherings with Friends and Family: Not on file    Attends Religious Services: Not on file    Active Member of Clubs or Organizations: Not on file    Attends Banker Meetings: Not on file    Marital Status: Married  Intimate Partner Violence: Not At Risk (10/08/2018)   Humiliation, Afraid, Rape, and Kick questionnaire    Fear of Current or Ex-Partner: No    Emotionally Abused: No    Physically Abused: No    Sexually Abused: No    FAMILY HISTORY: Family History  Problem Relation Age of Onset   Coronary artery disease Father    Heart attack Father    Coronary artery disease Mother    Heart attack Mother    Ovarian cancer Sister 4       sister had hormonal therapy for IVF txs-which increased risk factor for ovarian cancer   Breast cancer Neg Hx     ALLERGIES:  has No Known Allergies.  MEDICATIONS:  Current Outpatient Medications  Medication Sig Dispense Refill   ALPRAZolam (XANAX) 0.25 MG tablet Take 0.25 mg by mouth daily as needed  for anxiety.     aspirin EC 81 MG tablet Take 1 tablet (81 mg total) by mouth daily. 30 tablet 11   cholecalciferol (VITAMIN D) 1000 units tablet Take 1,000 Units by mouth 2 (two) times daily.     dapagliflozin propanediol (FARXIGA) 5 MG TABS tablet Take 5 mg by mouth daily.     denosumab (PROLIA) 60 MG/ML SOLN injection Inject 60 mg into the  skin every 6 (six) months.      escitalopram (LEXAPRO) 10 MG tablet Take 10 mg by mouth daily.     estradiol (ESTRACE) 0.1 MG/GM vaginal cream Place 1 Applicatorful vaginally daily as needed (vaginal irritation).     famotidine (PEPCID) 40 MG tablet Take 40 mg by mouth daily.     furosemide (LASIX) 20 MG tablet Take 20 mg by mouth every Wednesday.     gabapentin (NEURONTIN) 300 MG capsule Take 300 mg by mouth at bedtime as needed (pain).     HYDROcodone-acetaminophen (NORCO/VICODIN) 5-325 MG tablet Take 1 tablet by mouth daily.     levothyroxine (SYNTHROID, LEVOTHROID) 100 MCG tablet Take 100 mcg by mouth daily before breakfast.   3   lidocaine-prilocaine (EMLA) cream Apply 1 application topically as needed (apply prior to port a cath access). 30 g 3   Magnesium 500 MG TABS Take 500 mg by mouth every morning.      metoprolol succinate (TOPROL-XL) 50 MG 24 hr tablet Take 50 mg by mouth daily. Take with or immediately following a meal.     mirtazapine (REMERON) 15 MG tablet Take 15 mg by mouth as needed.     Multiple Vitamin (MULTIVITAMIN WITH MINERALS) TABS tablet Take 1 tablet by mouth daily. Centrum Silver     NOVOLOG FLEXPEN 100 UNIT/ML FlexPen SMARTSIG:8 Unit(s) SUB-Q Morning-Evening     nystatin cream (MYCOSTATIN) Apply 1 application topically daily as needed (Yeast infection).     olmesartan (BENICAR) 20 MG tablet Take 20 mg by mouth daily.     pantoprazole (PROTONIX) 40 MG tablet Take 40 mg by mouth daily.     rivaroxaban (XARELTO) 20 MG TABS tablet Take 1 tablet (20 mg total) by mouth daily with supper. 90 tablet 3   rosuvastatin (CRESTOR) 20 MG  tablet Take 20 mg by mouth every morning.     sodium bicarbonate 650 MG tablet Take 650 mg by mouth 2 (two) times daily.     TRESIBA FLEXTOUCH 200 UNIT/ML SOPN Inject 25-30 Units as directed at bedtime. Titrate according to fasting blood glucose not to exceed 50 units a day  5   vitamin B-12 (CYANOCOBALAMIN) 1000 MCG tablet Take 1,000 mcg by mouth daily.     vitamin C (ASCORBIC ACID) 250 MG tablet Take 250 mg by mouth daily.     vitamin E 400 UNIT capsule Take 400 Units by mouth daily.     Zinc Sulfate (ZINC 15 PO) Take 1 tablet by mouth daily.     zolpidem (AMBIEN) 10 MG tablet Take 10 mg by mouth at bedtime.     No current facility-administered medications for this visit.      PHYSICAL EXAMINATION:   Vitals:   08/17/22 1422  BP: (!) 153/39  Pulse: 78  Temp: 98.6 F (37 C)  SpO2: 96%   Filed Weights   08/17/22 1422  Weight: 168 lb 12.8 oz (76.6 kg)    Physical Exam Constitutional:      Comments: Alone.  Ambulating independently.  HENT:     Head: Normocephalic and atraumatic.     Mouth/Throat:     Pharynx: No oropharyngeal exudate.  Eyes:     Pupils: Pupils are equal, round, and reactive to light.  Cardiovascular:     Rate and Rhythm: Normal rate and regular rhythm.  Pulmonary:     Effort: Pulmonary effort is normal. No respiratory distress.     Breath sounds: Normal breath sounds. No wheezing.  Abdominal:  General: Bowel sounds are normal. There is no distension.     Palpations: Abdomen is soft. There is no mass.     Tenderness: There is no abdominal tenderness. There is no guarding or rebound.  Musculoskeletal:        General: No tenderness. Normal range of motion.     Cervical back: Normal range of motion and neck supple.  Skin:    General: Skin is warm.  Neurological:     Mental Status: She is alert and oriented to person, place, and time.  Psychiatric:        Mood and Affect: Affect normal.     LABORATORY DATA:  I have reviewed the data as  listed Lab Results  Component Value Date   WBC 4.6 08/17/2022   HGB 10.3 (L) 08/17/2022   HCT 32.4 (L) 08/17/2022   MCV 94.2 08/17/2022   PLT 327 08/17/2022   Recent Labs    12/06/21 1051 01/18/22 1355 03/30/22 1318 05/25/22 1413 08/17/22 1414  NA 139   < > 135 139 136  K 4.7   < > 4.5 4.1 4.5  CL 108   < > 106 101 107  CO2 21*   < > 23 27 21*  GLUCOSE 136*   < > 204* 215* 269*  BUN 30*   < > 26* 29* 24*  CREATININE 1.62*   < > 1.66* 1.79* 1.48*  CALCIUM 9.5   < > 8.6* 9.6 8.0*  GFRNONAA 33*   < > 32* 29* 36*  PROT 6.9  --   --   --   --   ALBUMIN 3.9  --   --   --   --   AST 25  --   --   --   --   ALT 18  --   --   --   --   ALKPHOS 50  --   --   --   --   BILITOT 0.6  --   --   --   --    < > = values in this interval not displayed.     Intravitreal Injection, Pharmacologic Agent - OD - Right Eye  Result Date: 08/01/2022 Time Out 08/01/2022. 2:48 PM. Confirmed correct patient, procedure, site, and patient consented. Anesthesia Topical anesthesia was used. Anesthetic medications included Lidocaine 2%, Proparacaine 0.5%. Procedure Preparation included 5% betadine to ocular surface, eyelid speculum. A (32 g) needle was used. Injection: 6 mg faricimab-svoa 6 MG/0.05ML   Route: Intravitreal, Site: Right Eye   NDC: O8010301, Lot: [id number], Expiration date: 09/02/2024, Waste: 0 mL Post-op Post injection exam found visual acuity of at least counting fingers. The patient tolerated the procedure well. There were no complications. The patient received written and verbal post procedure care education. Post injection medications were not given.   OCT, Retina - OU - Both Eyes  Result Date: 08/01/2022 Right Eye Quality was good. Central Foveal Thickness: 328. Progression has worsened. Findings include no IRF, no SRF, abnormal foveal contour, epiretinal membrane (Mild interval increase in IRF / cystic changes temporal fovea and macula). Left Eye Quality was good. Central Foveal  Thickness: 291. Progression has been stable. Findings include normal foveal contour, no IRF, no SRF. Notes *Images captured and stored on drive Diagnosis / Impression: OD: BRVO w/ CME -- Mild interval increase in IRF / cystic changes temporal fovea and macula OS: NFP; no IRF/SRF--stable Clinical management: See below Abbreviations: NFP - Normal foveal profile. CME - cystoid macular  edema. PED - pigment epithelial detachment. IRF - intraretinal fluid. SRF - subretinal fluid. EZ - ellipsoid zone. ERM - epiretinal membrane. ORA - outer retinal atrophy. ORT - outer retinal tubulation. 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  If Retacrit/iron does not improve consider bone marrow biopsy.  #Recommend gentle iron [iron biglycinate; 28 mg ] 1 pill a day.  This pill is unlikely to cause stomach upset or cause constipation.   # MARCH  2024- Ferritin- 21; I sat- 39%-  hemoglobin 10.1. HOLD retacrit; Proceed with venofer.   # Etiology-CKD-III--IV; GFR ~21 overall worsened [Dr.Kolluru];- stable.   # #Diabetes/complications [id number]-[id number][Dr.Solum]-overall stable; Gangrene Right toes s/p amputation [May 2022]- on xarelto; s/p  left toe amputation- stable.   #Poor IV access/Mediport placement- stable.   Schedule retacrit/venofer on separate days  # DISPOSITION:  # HOLD Venofer today; NO retacrit; de-access   # every 4 weeks- labs- H&H- possible retacrit  # follow up 16 weeks - MD; port; labs- cbc/bmp; iron studies;ferritin; possible retacrit OR venofer- Dr.B     All questions were answered. The patient knows to call the clinic with any problems, questions or concerns.    Earna Coder, MD 08/17/2022 3:15 PM

## 2022-08-17 NOTE — Assessment & Plan Note (Addendum)
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  If Retacrit/iron does not improve consider bone marrow biopsy.  #Recommend gentle iron [iron biglycinate; 28 mg ] 1 pill a day.  This pill is unlikely to cause stomach upset or cause constipation.   # MARCH  2024- Ferritin- 21; I sat- 39%-  hemoglobin 10.1. HOLD retacrit; Proceed with venofer.   # Etiology-CKD-III--IV; GFR ~21 overall worsened [Dr.Kolluru];- stable.   # #Diabetes/complications [id number]-293[Dr.Solum]-overall stable; Gangrene Right toes s/p amputation [May 2022]- on xarelto; s/p  left toe amputation- stable.   #Poor IV access/Mediport placement- stable.   Schedule retacrit/venofer on separate days  # DISPOSITION:  # HOLD Venofer today; NO retacrit; de-access   # every 4 weeks- labs- H&H- possible retacrit  # follow up 16 weeks - MD; port; labs- cbc/bmp; iron studies;ferritin; possible retacrit OR venofer- Dr.B

## 2022-08-25 NOTE — Progress Notes (Signed)
[facility] - Clinic Note  08/29/2022     CHIEF COMPLAINT Patient presents for Retina Follow Up   HISTORY OF PRESENT ILLNESS: Cheryl Leonard is a 77 y.o. female who presents to the clinic today for:    HPI     Retina Follow Up   Patient presents with  CRVO/BRVO.  In right eye.  This started 4 weeks ago.  I, the attending physician,  performed the HPI with the patient and updated documentation appropriately.        Comments   Patient here for 4 weeks retina follow up for BRVO OD. Patient states vision doing fine. The same as usual. No eye pain.       Last edited by [name], MD on 08/29/2022  5:12 PM.     Referring physician: Danella Penton, MD [phone] [facility] [address] [facility] [place],  [place] [address]   HISTORICAL INFORMATION:   Selected notes from the MEDICAL RECORD NUMBER Referred by Dr. Senaida Ores for concern of DME OD Lab Results  Component Value Date   HGBA1C 7.3 (H) 01/11/2021     CURRENT MEDICATIONS: No current outpatient medications on file. (Ophthalmic Drugs)   No current facility-administered medications for this visit. (Ophthalmic Drugs)   Current Outpatient Medications (Other)  Medication Sig   ALPRAZolam (XANAX) 0.25 MG tablet Take 0.25 mg by mouth daily as needed for anxiety.   aspirin EC 81 MG tablet Take 1 tablet (81 mg total) by mouth daily.   cholecalciferol (VITAMIN D) 1000 units tablet Take 1,000 Units by mouth 2 (two) times daily.   dapagliflozin propanediol (FARXIGA) 5 MG TABS tablet Take 5 mg by mouth daily.   denosumab (PROLIA) 60 MG/ML SOLN injection Inject 60 mg into the skin every 6 (six) months.    escitalopram (LEXAPRO) 10 MG tablet Take 10 mg by mouth daily.   estradiol (ESTRACE) 0.1 MG/GM vaginal cream Place 1 Applicatorful vaginally daily as needed (vaginal irritation).   famotidine (PEPCID) 40 MG tablet Take 40 mg by mouth daily.   furosemide (LASIX) 20 MG tablet Take 20 mg by  mouth every Wednesday.   gabapentin (NEURONTIN) 300 MG capsule Take 300 mg by mouth at bedtime as needed (pain).   HYDROcodone-acetaminophen (NORCO/VICODIN) 5-325 MG tablet Take 1 tablet by mouth daily.   levothyroxine (SYNTHROID, LEVOTHROID) 100 MCG tablet Take 100 mcg by mouth daily before breakfast.    lidocaine-prilocaine (EMLA) cream Apply 1 application topically as needed (apply prior to port a cath access).   Magnesium 500 MG TABS Take 500 mg by mouth every morning.    metoprolol succinate (TOPROL-XL) 50 MG 24 hr tablet Take 50 mg by mouth daily. Take with or immediately following a meal.   mirtazapine (REMERON) 15 MG tablet Take 15 mg by mouth as needed.   Multiple Vitamin (MULTIVITAMIN WITH MINERALS) TABS tablet Take 1 tablet by mouth daily. Centrum Silver   NOVOLOG FLEXPEN 100 UNIT/ML FlexPen SMARTSIG:8 Unit(s) SUB-Q Morning-Evening   nystatin cream (MYCOSTATIN) Apply 1 application topically daily as needed (Yeast infection).   olmesartan (BENICAR) 20 MG tablet Take 20 mg by mouth daily.   pantoprazole (PROTONIX) 40 MG tablet Take 40 mg by mouth daily.   rivaroxaban (XARELTO) 20 MG TABS tablet Take 1 tablet (20 mg total) by mouth daily with supper.   rosuvastatin (CRESTOR) 20 MG tablet Take 20 mg by mouth every morning.   sodium bicarbonate 650 MG tablet Take 650 mg by mouth 2 (  two) times daily.   TRESIBA FLEXTOUCH 200 UNIT/ML SOPN Inject 25-30 Units as directed at bedtime. Titrate according to fasting blood glucose not to exceed 50 units a day   vitamin B-12 (CYANOCOBALAMIN) 1000 MCG tablet Take 1,000 mcg by mouth daily.   vitamin C (ASCORBIC ACID) 250 MG tablet Take 250 mg by mouth daily.   vitamin E 400 UNIT capsule Take 400 Units by mouth daily.   Zinc Sulfate (ZINC 15 PO) Take 1 tablet by mouth daily.   zolpidem (AMBIEN) 10 MG tablet Take 10 mg by mouth at bedtime.   No current facility-administered medications for this visit. (Other)   REVIEW OF SYSTEMS: ROS   Positive for:  Gastrointestinal, Musculoskeletal, Endocrine, Cardiovascular, Eyes, Respiratory Negative for: Constitutional, Neurological, Skin, Genitourinary, HENT, Psychiatric, Allergic/Imm, Heme/Lymph Last edited by Laddie Aquas, COA on 08/29/2022  2:00 PM.        ALLERGIES No Known Allergies  PAST MEDICAL HISTORY Past Medical History:  Diagnosis Date   Anemia    Anxiety    Arthritis    Gout   Cataracts, both eyes    Diabetic retinopathy (HCC)    NPDR OU   Diverticulitis    GERD (gastroesophageal reflux disease)    Gout    Headache    h/o migraines   History of fracture of patella    right knee   History of positive PPD    Patient always shows positive   Hyperlipidemia    Hypertension    Hypertensive retinopathy    OU   Hypothyroidism    Lichen sclerosus 12/30/2013   of vulva   Metatarsal fracture    Neuropathy    Osteopenia    Peripheral vascular disease (HCC)    Polyneuropathy    numbness and tingling in feet and toes   Renal insufficiency    Stage 3   Sleep apnea    does not use cpap-lost weight    Type 2 diabetes mellitus, uncontrolled    Past Surgical History:  Procedure Laterality Date   ABDOMINAL HYSTERECTOMY     AMPUTATION TOE Right 05/08/2020   Procedure: AMPUTATION TOE-Right 4th Toe;  Surgeon: Rosetta Posner, DPM;  Location: [facility];  Service: Podiatry;  Laterality: Right;   AMPUTATION TOE Left 04/22/2021   Procedure: AMPUTATION TOE - 4TH METARSOPHANGEAL JOINT;  Surgeon: Rosetta Posner, DPM;  Location: [facility];  Service: Podiatry;  Laterality: Left;   APPENDECTOMY     BREAST REDUCTION SURGERY  2001   CATARACT EXTRACTION     CESAREAN SECTION  1976   COLONOSCOPY  03/05/2013   Nml - due for repeat 03/06/2018   COLONOSCOPY WITH PROPOFOL N/A 03/18/2019   Procedure: COLONOSCOPY WITH PROPOFOL;  Surgeon: Toledo, [name], MD;  Location: [facility];  Service: Gastroenterology;  Laterality: N/A;   DIAGNOSTIC LAPAROSCOPY     DILATION AND CURETTAGE OF UTERUS  1989    ENDARTERECTOMY FEMORAL Bilateral 03/09/2018   Procedure: ENDARTERECTOMY FEMORAL;  Surgeon: Annice Needy, MD;  Location: [facility];  Service: Vascular;  Laterality: Bilateral;   ENDARTERECTOMY POPLITEAL Left 03/09/2018   Procedure: ENDARTERECTOMY POPLITEAL AND SFA;  Surgeon: Annice Needy, MD;  Location: [facility];  Service: Vascular;  Laterality: Left;   ESOPHAGOGASTRODUODENOSCOPY  03/05/2013   ESOPHAGOGASTRODUODENOSCOPY (EGD) WITH PROPOFOL N/A 03/18/2019   Procedure: ESOPHAGOGASTRODUODENOSCOPY (EGD) WITH PROPOFOL;  Surgeon: Toledo, [name], MD;  Location: [facility];  Service: Gastroenterology;  Laterality: N/A;   EYE SURGERY     Eyelid Surgery  2012  INTRAMEDULLARY (IM) NAIL INTERTROCHANTERIC Left 10/30/2015   Procedure: INTRAMEDULLARY (IM) NAIL INTERTROCHANTRIC ;  Surgeon: Kennedy Bucker, MD;  Location: [facility];  Service: Orthopedics;  Laterality: Left;   KYPHOPLASTY N/A 10/25/2018   Procedure: L4 KYPHOPLASTY;  Surgeon: Kennedy Bucker, MD;  Location: [facility];  Service: Orthopedics;  Laterality: N/A;   LAPAROSCOPIC HYSTERECTOMY  2000   total   LOWER EXTREMITY ANGIOGRAPHY Left 03/08/2017   Procedure: LOWER EXTREMITY ANGIOGRAPHY;  Surgeon: Annice Needy, MD;  Location: [facility];  Service: Cardiovascular;  Laterality: Left;   LOWER EXTREMITY ANGIOGRAPHY Left 10/30/2017   Procedure: LOWER EXTREMITY ANGIOGRAPHY;  Surgeon: Annice Needy, MD;  Location: [facility];  Service: Cardiovascular;  Laterality: Left;   LOWER EXTREMITY ANGIOGRAPHY Right 03/08/2018   Procedure: LOWER EXTREMITY ANGIOGRAPHY;  Surgeon: Annice Needy, MD;  Location: [facility];  Service: Cardiovascular;  Laterality: Right;   LOWER EXTREMITY ANGIOGRAPHY Left 10/01/2018   Procedure: LOWER EXTREMITY ANGIOGRAPHY;  Surgeon: Annice Needy, MD;  Location: [facility];  Service: Cardiovascular;  Laterality: Left;   LOWER EXTREMITY ANGIOGRAPHY Right 10/08/2018   Procedure: LOWER EXTREMITY ANGIOGRAPHY;   Surgeon: Annice Needy, MD;  Location: [facility];  Service: Cardiovascular;  Laterality: Right;   LOWER EXTREMITY ANGIOGRAPHY Right 05/07/2020   Procedure: Lower Extremity Angiography;  Surgeon: Annice Needy, MD;  Location: [facility];  Service: Cardiovascular;  Laterality: Right;   LYSIS OF ADHESION  01/25/2021   Procedure: LYSIS OF ADHESION;  Surgeon: Campbell Lerner, MD;  Location: [facility];  Service: General;;   PERIPHERAL VASCULAR INTERVENTION  03/08/2018   Procedure: PERIPHERAL VASCULAR INTERVENTION;  Surgeon: Annice Needy, MD;  Location: [facility];  Service: Cardiovascular;;   PORTA CATH INSERTION N/A 02/17/2020   Procedure: PORTA CATH INSERTION;  Surgeon: Annice Needy, MD;  Location: [facility];  Service: Cardiovascular;  Laterality: N/A;   REDUCTION MAMMAPLASTY  1997   SACROPLASTY N/A 10/25/2018   Procedure: S1 SACROPLASTY;  Surgeon: Kennedy Bucker, MD;  Location: [facility];  Service: Orthopedics;  Laterality: N/A;   FAMILY HISTORY Family History  Problem Relation Age of Onset   Coronary artery disease Father    Heart attack Father    Coronary artery disease Mother    Heart attack Mother    Ovarian cancer Sister 14       sister had hormonal therapy for IVF txs-which increased risk factor for ovarian cancer   Breast cancer Neg Hx    SOCIAL HISTORY Social History   Tobacco Use   Smoking status: Former    Current packs/day: 0.00    Average packs/day: 1 pack/day for 20.0 years (20.0 ttl pk-yrs)    Types: Cigarettes    Start date: 03/07/1976    Quit date: 03/07/1996    Years since quitting: 26.4    Passive exposure: Past   Smokeless tobacco: Never   Tobacco comments:    started smoking at age 46 but stopped smoking in 2000  Vaping Use   Vaping status: Never Used  Substance Use Topics   Alcohol use: No    Alcohol/week: 0.0 standard drinks of alcohol   Drug use: No       OPHTHALMIC EXAM: Base Eye Exam     Visual Acuity (Snellen -  Linear)       Right Left   Dist [place] 20/30 20/20   Dist ph [place] NI  Tonometry (Tonopen, 1:58 PM)       Right Left   Pressure 18 15         Pupils       Dark Light Shape React APD   Right 3 2 Round Brisk None   Left 3 2 Round Brisk None         Visual Fields (Counting fingers)       Left Right    Full Full         Extraocular Movement       Right Left    Full, Ortho Full, Ortho         Neuro/Psych     Oriented x3: Yes   Mood/Affect: Normal         Dilation     Both eyes: 1.0% Mydriacyl, 2.5% Phenylephrine @ 1:58 PM           Slit Lamp and Fundus Exam     External Exam       Right Left   External Normal Normal         Slit Lamp Exam       Right Left   Lids/Lashes dermatochalasis dermatochalasis   Conjunctiva/Sclera White and quiet White and quiet   Cornea arcus; well healed cataract wound; 2-3+ diffuse Punctate epithelial erosions, decreased TBUT, mild Anterior basement membrane dystrophy superiorly arcus; well healed cataract wound, 2-3+ diffuse Punctate epithelial erosions, irregualr epi surface, decreased TBUT   Anterior Chamber Deep and quiet Deep and quiet   Iris Round and dilated Round and dilated   Lens PCIOL; open PC PCIOL; open PC   Anterior Vitreous syneresis, Posterior vitreous detachment, vitreous condensations inferiorly syneresis, Posterior vitreous detachment         Fundus Exam       Right Left   Disc Superior hyperemia -- improved, mild Pallor Pink and Sharp   C/D Ratio 0.6 0.5   Macula Flat, Blunted foveal reflex, +cystic changes, +Epiretinal membrane, minimal MA flat; good foveal reflex, no heme or edema, small pigment clump IT to fovea   Vessels attenuated, Tortuous attenuated, Tortuous   Periphery Attached; scattered DBH -- greastest temporal periphery Attached, no heme           IMAGING AND PROCEDURES  Imaging and Procedures for 04/25/17  OCT, Retina - OU - Both Eyes       Right Eye Quality  was good. Central Foveal Thickness: 334. Progression has improved. Findings include no IRF, no SRF, abnormal foveal contour, epiretinal membrane (persistent IRF / cystic changes temporal fovea and macula -- slightly improved).   Left Eye Quality was good. Central Foveal Thickness: 291. Progression has been stable. Findings include normal foveal contour, no IRF, no SRF.   Notes *Images captured and stored on drive  Diagnosis / Impression:  OD: BRVO w/ CME -- persistent IRF / cystic changes temporal fovea and macula -- slightly improved OS: NFP; no IRF/SRF--stable  Clinical management:  See below  Abbreviations: NFP - Normal foveal profile. CME - cystoid macular edema. PED - pigment epithelial detachment. IRF - intraretinal fluid. SRF - subretinal fluid. EZ - ellipsoid zone. ERM - epiretinal membrane. ORA - outer retinal atrophy. ORT - outer retinal tubulation. SRHM - subretinal hyper-reflective material      Intravitreal Injection, Pharmacologic Agent - OD - Right Eye       Time Out 08/29/2022. 3:21 PM. Confirmed correct patient, procedure, site, and patient consented.   Anesthesia Topical anesthesia was used. Anesthetic medications included Lidocaine  2%, Proparacaine 0.5%.   Procedure Preparation included 5% betadine to ocular surface, eyelid speculum. A (32 g) needle was used.   Injection: 6 mg faricimab-svoa 6 MG/0.05ML   Route: Intravitreal, Site: Right Eye   NDC: O8010301, Lot: [id number], Expiration date: 09/02/2024, Waste: 0 mL   Post-op Post injection exam found visual acuity of at least counting fingers. The patient tolerated the procedure well. There were no complications. The patient received written and verbal post procedure care education. Post injection medications were not given.             ASSESSMENT/PLAN:    ICD-10-CM   1. Branch retinal vein occlusion of right eye with macular edema  H34.8310 OCT, Retina - OU - Both Eyes    Intravitreal Injection,  Pharmacologic Agent - OD - Right Eye    faricimab-svoa (VABYSMO) 6mg /0.22mL intravitreal injection    2. Both eyes affected by mild nonproliferative diabetic retinopathy with macular edema, associated with type 2 diabetes mellitus (HCC)  L24.4010     3. Long term (current) use of oral hypoglycemic drugs  Z79.84     4. Current use of insulin (HCC)  Z79.4     5. Essential hypertension  I10     6. Hypertensive retinopathy of both eyes  H35.033     7. Epiretinal membrane (ERM) of right eye  H35.371     8. Pseudophakia of both eyes  Z96.1       1. BRVO w/ CME OD - by history, pt states symptoms first noticed 2 wks prior to presentation, but reports changes may have occurred prior  - initial exam with differential tortuosity of vessels (OD > OS) - FA (02.10.20) shows mild late staining / leakage in macula, staining / leakage of disc -- improving CME - differential includes DM2 (DME), hypertensive retinopathy, inflammatory etiology / uveitis - S/P IVA OD #1 (02.08.19), #2 (03.11.19), #3 (04.09.19), #4 (05.20.19), #5 (02.10.20) - gave IVA OD on 2.10.20 due to pending Eylea4U for 2020 -- resulted in increased IRF/CME - review of OCTs show persistent IRF and cystic changes --  resistance to IVA  - June 2019 -- switched therapies:  ========================================================== - S/P IVE OD #1 (06.24.19), #2 (07.24.19), #3 (09.04.19), #4 (10.30.19),#5 (12.30.19), #6 (03.23.20), #7 (05.05.20), #8 (07.16.20), #9 (07.17.20), #10 (08.28.20), #11 (10.13.20), # 12 (11.17.20), #13 (2.8.21), #14 (03.09.21), #15 (04.13.21), #16 (05.11.21), #17 (06.17.21), #18 (07.23.21), #19 (08.30.21), #20 (10.04.21), #21 (11.08.21), #22 (12.08.21), #23 (01.31.22), #24 (02.28.22), #25 (04.01.22), #26 (06.15.22), #27 (07.13.22), #28 (08.17.22), #29 (09.21.22), #30 (10.19.22), #31 (11.16.22), #32 (12.16.22), #33 (01.13.23), #34 (02.10.23), #35 (03.15.23), #36 (04.14.23), #37 (05.15.23), #38 (06.12.23), #39  (07.10.23), #40 (08.07.23), #41 (09.06.23), #42 (10.04.23), #43 (11.01.23) -- IVE resistance ========================================================== **interval increase in IRF at 5 weeks on 07.01.24 exam (IVV)** - s/p IVV OD #1 (12.04.23), #2 (01.03.24), #3 (01.31.24), #4 (02.28.24), #5 (03.29.24), #6 (04.24.24), #7 (05.28.24), #8 (07.01.24), 39 (07.29.24) - OCT today shows persistent IRF / cystic changes temporal fovea and macula -- slightly improved at 4 weeks  - BCVA 20/30 -- stable  - recommend IVV OD #10 today, 08.26.24 w/ f/u in 4 weeks  - RBA of procedure discussed, questions answered  - informed consent obtained  - see procedure note   - Vabysmo informed consent form obtained, signed and scanned on 12.04.23  - f/u 4 weeks  -- DFE/OCT/possible injection  2-4. Mild nonproliferative diabetic retinopathy, both eyes - A1c 6.5 on 7.25.24, 6.2 on 03.14.24, 6.2 on 09.12.23; 6.6 on 07.01.23;  6.0 on 10.14.22  - could be contributing to CME OD  - OS with minimal diabetic retinopathy  - continue to monitor  5,6. Hypertensive retinopathy OU - stable  - as above, may have contributing to CME OD  - discussed importance of tight BP control  - monitor  7. Epiretinal membrane, right eye   - stable nasal ERM  - no indication for surgery at this time  8. Pseudophakia OU  - s/p CE/IOL OU [facility]  - doing well  - monitor  Ophthalmic Meds Ordered this visit:  Meds ordered this encounter  Medications   faricimab-svoa (VABYSMO) 6mg /0.61mL intravitreal injection     Return in about 4 weeks (around 09/26/2022) for f/u BRVO OD, DFE, OCT.  This document serves as a record of services personally performed by Karie Chimera, MD, PhD. It was created on their behalf by Annalee Genta, COMT. The creation of this record is the provider's dictation and/or activities during the visit.  Electronically signed by: Annalee Genta, COMT 08/29/22 5:13 PM  This document serves as a record  of services personally performed by Karie Chimera, MD, PhD. It was created on their behalf by Glee Arvin. Manson Passey, OA an ophthalmic technician. The creation of this record is the provider's dictation and/or activities during the visit.    Electronically signed by: Glee Arvin. Manson Passey, OA 08/29/22 5:13 PM  Karie Chimera, M.D., Ph.D. Diseases & Surgery of the Retina and Vitreous [facility]  I have reviewed the above documentation for accuracy and completeness, and I agree with the above. Karie Chimera, M.D., Ph.D. 08/29/22 5:13 PM  Abbreviations: M myopia (nearsighted); A astigmatism; H hyperopia (farsighted); P presbyopia; Mrx spectacle prescription;  CTL contact lenses; OD right eye; OS left eye; OU both eyes  XT exotropia; ET esotropia; PEK punctate epithelial keratitis; PEE punctate epithelial erosions; DES dry eye syndrome; MGD meibomian gland dysfunction; ATs artificial tears; PFAT's preservative free artificial tears; NSC nuclear sclerotic cataract; PSC posterior subcapsular cataract; ERM epi-retinal membrane; PVD posterior vitreous detachment; RD retinal detachment; DM diabetes mellitus; DR diabetic retinopathy; NPDR non-proliferative diabetic retinopathy; PDR proliferative diabetic retinopathy; CSME clinically significant macular edema; DME diabetic macular edema; dbh dot blot hemorrhages; CWS cotton wool spot; POAG primary open angle glaucoma; C/D cup-to-disc ratio; HVF humphrey visual field; GVF goldmann visual field; OCT optical coherence tomography; IOP intraocular pressure; BRVO Branch retinal vein occlusion; CRVO central retinal vein occlusion; CRAO central retinal artery occlusion; BRAO branch retinal artery occlusion; RT retinal tear; SB scleral buckle; PPV pars plana vitrectomy; VH Vitreous hemorrhage; PRP panretinal laser photocoagulation; IVK intravitreal kenalog; VMT vitreomacular traction; MH Macular hole;  NVD neovascularization of the disc; NVE  neovascularization elsewhere; AREDS age related eye disease study; ARMD age related macular degeneration; POAG primary open angle glaucoma; EBMD epithelial/anterior basement membrane dystrophy; ACIOL anterior chamber intraocular lens; IOL intraocular lens; PCIOL posterior chamber intraocular lens; Phaco/IOL phacoemulsification with intraocular lens placement; PRK photorefractive keratectomy; LASIK laser assisted in situ keratomileusis; HTN hypertension; DM diabetes mellitus; COPD chronic obstructive pulmonary disease

## 2022-08-29 ENCOUNTER — Encounter (INDEPENDENT_AMBULATORY_CARE_PROVIDER_SITE_OTHER): Payer: Self-pay | Admitting: Ophthalmology

## 2022-08-29 ENCOUNTER — Ambulatory Visit (INDEPENDENT_AMBULATORY_CARE_PROVIDER_SITE_OTHER): Payer: Medicare Other | Admitting: Ophthalmology

## 2022-08-29 DIAGNOSIS — H34831 Tributary (branch) retinal vein occlusion, right eye, with macular edema: Secondary | ICD-10-CM | POA: Diagnosis not present

## 2022-08-29 DIAGNOSIS — E113213 Type 2 diabetes mellitus with mild nonproliferative diabetic retinopathy with macular edema, bilateral: Secondary | ICD-10-CM

## 2022-08-29 DIAGNOSIS — Z794 Long term (current) use of insulin: Secondary | ICD-10-CM | POA: Diagnosis not present

## 2022-08-29 DIAGNOSIS — Z7984 Long term (current) use of oral hypoglycemic drugs: Secondary | ICD-10-CM | POA: Diagnosis not present

## 2022-08-29 DIAGNOSIS — H35033 Hypertensive retinopathy, bilateral: Secondary | ICD-10-CM

## 2022-08-29 DIAGNOSIS — I10 Essential (primary) hypertension: Secondary | ICD-10-CM

## 2022-08-29 DIAGNOSIS — Z961 Presence of intraocular lens: Secondary | ICD-10-CM

## 2022-08-29 DIAGNOSIS — H35371 Puckering of macula, right eye: Secondary | ICD-10-CM

## 2022-08-29 MED ORDER — FARICIMAB-SVOA 6 MG/0.05ML IZ SOLN
6.0000 mg | INTRAVITREAL | Status: AC | PRN
Start: 2022-08-29 — End: 2022-08-29
  Administered 2022-08-29: 6 mg via INTRAVITREAL

## 2022-09-02 ENCOUNTER — Ambulatory Visit (INDEPENDENT_AMBULATORY_CARE_PROVIDER_SITE_OTHER): Payer: Medicare Other | Admitting: Vascular Surgery

## 2022-09-02 ENCOUNTER — Encounter (INDEPENDENT_AMBULATORY_CARE_PROVIDER_SITE_OTHER): Payer: Self-pay | Admitting: Vascular Surgery

## 2022-09-02 ENCOUNTER — Ambulatory Visit (INDEPENDENT_AMBULATORY_CARE_PROVIDER_SITE_OTHER): Payer: Medicare Other

## 2022-09-02 VITALS — BP 131/68 | HR 69 | Resp 18 | Ht 64.0 in | Wt 169.8 lb

## 2022-09-02 DIAGNOSIS — E1159 Type 2 diabetes mellitus with other circulatory complications: Secondary | ICD-10-CM | POA: Diagnosis not present

## 2022-09-02 DIAGNOSIS — N183 Chronic kidney disease, stage 3 unspecified: Secondary | ICD-10-CM

## 2022-09-02 DIAGNOSIS — D631 Anemia in chronic kidney disease: Secondary | ICD-10-CM

## 2022-09-02 DIAGNOSIS — I10 Essential (primary) hypertension: Secondary | ICD-10-CM | POA: Diagnosis not present

## 2022-09-02 DIAGNOSIS — I70219 Atherosclerosis of native arteries of extremities with intermittent claudication, unspecified extremity: Secondary | ICD-10-CM | POA: Diagnosis not present

## 2022-09-02 DIAGNOSIS — I70223 Atherosclerosis of native arteries of extremities with rest pain, bilateral legs: Secondary | ICD-10-CM

## 2022-09-02 DIAGNOSIS — E782 Mixed hyperlipidemia: Secondary | ICD-10-CM

## 2022-09-02 NOTE — Assessment & Plan Note (Signed)
Noninvasive studies today show a right ABI of 1.12 and a left ABI of 1.06 with normal triphasic waveforms and normal digital pressures bilaterally.  Symptoms were resolved after extensive revascularization.  Perfusion remains intact at this point.  Given her high risk status I will continue to follow her on 84-month intervals.  Contact our office with any problems in the interim.

## 2022-09-02 NOTE — Progress Notes (Signed)
MRN : [id number]  Cheryl Leonard is a 77 y.o. (Mar 28, 1945) female who presents with chief complaint of  Chief Complaint  Patient presents with   Follow-up    f/u in 6 months with abi -  .  History of Present Illness: Patient returns today in follow up of her PAD.  She underwent extensive lower extremity revascularization a few years ago and has done well without any new issues recently.  No ischemic rest pain, ulceration, or lifestyle limiting claudication.  She no longer smokes.  She has remained on Crestor, aspirin, and Xarelto and tolerated this well.  Noninvasive studies today show a right ABI of 1.12 and a left ABI of 1.06 with normal triphasic waveforms and normal digital pressures bilaterally.  Current Outpatient Medications  Medication Sig Dispense Refill   ALPRAZolam (XANAX) 0.25 MG tablet Take 0.25 mg by mouth daily as needed for anxiety.     aspirin EC 81 MG tablet Take 1 tablet (81 mg total) by mouth daily. 30 tablet 11   cholecalciferol (VITAMIN D) 1000 units tablet Take 1,000 Units by mouth 2 (two) times daily.     dapagliflozin propanediol (FARXIGA) 5 MG TABS tablet Take 5 mg by mouth daily.     denosumab (PROLIA) 60 MG/ML SOLN injection Inject 60 mg into the skin every 6 (six) months.      escitalopram (LEXAPRO) 10 MG tablet Take 10 mg by mouth daily.     estradiol (ESTRACE) 0.1 MG/GM vaginal cream Place 1 Applicatorful vaginally daily as needed (vaginal irritation).     famotidine (PEPCID) 40 MG tablet Take 40 mg by mouth daily.     furosemide (LASIX) 20 MG tablet Take 20 mg by mouth every Wednesday.     gabapentin (NEURONTIN) 300 MG capsule Take 300 mg by mouth at bedtime as needed (pain).     HYDROcodone-acetaminophen (NORCO/VICODIN) 5-325 MG tablet Take 1 tablet by mouth daily.     levothyroxine (SYNTHROID, LEVOTHROID) 100 MCG tablet Take 100 mcg by mouth daily before breakfast.   3   lidocaine-prilocaine (EMLA) cream Apply 1 application topically as needed (apply  prior to port a cath access). 30 g 3   Magnesium 500 MG TABS Take 500 mg by mouth every morning.      metoprolol succinate (TOPROL-XL) 50 MG 24 hr tablet Take 50 mg by mouth daily. Take with or immediately following a meal.     mirtazapine (REMERON) 15 MG tablet Take 15 mg by mouth as needed.     Multiple Vitamin (MULTIVITAMIN WITH MINERALS) TABS tablet Take 1 tablet by mouth daily. Centrum Silver     NOVOLOG FLEXPEN 100 UNIT/ML FlexPen SMARTSIG:8 Unit(s) SUB-Q Morning-Evening     nystatin cream (MYCOSTATIN) Apply 1 application topically daily as needed (Yeast infection).     olmesartan (BENICAR) 20 MG tablet Take 20 mg by mouth daily.     pantoprazole (PROTONIX) 40 MG tablet Take 40 mg by mouth daily.     rivaroxaban (XARELTO) 20 MG TABS tablet Take 1 tablet (20 mg total) by mouth daily with supper. 90 tablet 3   rosuvastatin (CRESTOR) 20 MG tablet Take 20 mg by mouth every morning.     sodium bicarbonate 650 MG tablet Take 650 mg by mouth 2 (two) times daily.     TRESIBA FLEXTOUCH 200 UNIT/ML SOPN Inject 25-30 Units as directed at bedtime. Titrate according to fasting blood glucose not to exceed 50 units a day  5   vitamin B-12 (CYANOCOBALAMIN) 1000  MCG tablet Take 1,000 mcg by mouth daily.     vitamin C (ASCORBIC ACID) 250 MG tablet Take 250 mg by mouth daily.     vitamin E 400 UNIT capsule Take 400 Units by mouth daily.     Zinc Sulfate (ZINC 15 PO) Take 1 tablet by mouth daily.     zolpidem (AMBIEN) 10 MG tablet Take 10 mg by mouth at bedtime.     No current facility-administered medications for this visit.    Past Medical History:  Diagnosis Date   Anemia    Anxiety    Arthritis    Gout   Cataracts, both eyes    Diabetic retinopathy (HCC)    NPDR OU   Diverticulitis    GERD (gastroesophageal reflux disease)    Gout    Headache    h/o migraines   History of fracture of patella    right knee   History of positive PPD    Patient always shows positive   Hyperlipidemia     Hypertension    Hypertensive retinopathy    OU   Hypothyroidism    Lichen sclerosus 12/30/2013   of vulva   Metatarsal fracture    Neuropathy    Osteopenia    Peripheral vascular disease (HCC)    Polyneuropathy    numbness and tingling in feet and toes   Renal insufficiency    Stage 3   Sleep apnea    does not use cpap-lost weight    Type 2 diabetes mellitus, uncontrolled     Past Surgical History:  Procedure Laterality Date   ABDOMINAL HYSTERECTOMY     AMPUTATION TOE Right 05/08/2020   Procedure: AMPUTATION TOE-Right 4th Toe;  Surgeon: Rosetta Posner, DPM;  Location: [facility];  Service: Podiatry;  Laterality: Right;   AMPUTATION TOE Left 04/22/2021   Procedure: AMPUTATION TOE - 4TH METARSOPHANGEAL JOINT;  Surgeon: Rosetta Posner, DPM;  Location: [facility];  Service: Podiatry;  Laterality: Left;   APPENDECTOMY     BREAST REDUCTION SURGERY  2001   CATARACT EXTRACTION     CESAREAN SECTION  1976   COLONOSCOPY  03/05/2013   Nml - due for repeat 03/06/2018   COLONOSCOPY WITH PROPOFOL N/A 03/18/2019   Procedure: COLONOSCOPY WITH PROPOFOL;  Surgeon: Toledo, [name], MD;  Location: [facility];  Service: Gastroenterology;  Laterality: N/A;   DIAGNOSTIC LAPAROSCOPY     DILATION AND CURETTAGE OF UTERUS  1989   ENDARTERECTOMY FEMORAL Bilateral 03/09/2018   Procedure: ENDARTERECTOMY FEMORAL;  Surgeon: Annice Needy, MD;  Location: [facility];  Service: Vascular;  Laterality: Bilateral;   ENDARTERECTOMY POPLITEAL Left 03/09/2018   Procedure: ENDARTERECTOMY POPLITEAL AND SFA;  Surgeon: Annice Needy, MD;  Location: [facility];  Service: Vascular;  Laterality: Left;   ESOPHAGOGASTRODUODENOSCOPY  03/05/2013   ESOPHAGOGASTRODUODENOSCOPY (EGD) WITH PROPOFOL N/A 03/18/2019   Procedure: ESOPHAGOGASTRODUODENOSCOPY (EGD) WITH PROPOFOL;  Surgeon: Toledo, [name], MD;  Location: [facility];  Service: Gastroenterology;  Laterality: N/A;   EYE SURGERY     Eyelid Surgery  2012   INTRAMEDULLARY (IM) NAIL  INTERTROCHANTERIC Left 10/30/2015   Procedure: INTRAMEDULLARY (IM) NAIL INTERTROCHANTRIC ;  Surgeon: Kennedy Bucker, MD;  Location: [facility];  Service: Orthopedics;  Laterality: Left;   KYPHOPLASTY N/A 10/25/2018   Procedure: L4 KYPHOPLASTY;  Surgeon: Kennedy Bucker, MD;  Location: [facility];  Service: Orthopedics;  Laterality: N/A;   LAPAROSCOPIC HYSTERECTOMY  2000   total   LOWER EXTREMITY ANGIOGRAPHY Left 03/08/2017   Procedure: LOWER EXTREMITY  ANGIOGRAPHY;  Surgeon: Annice Needy, MD;  Location: [facility];  Service: Cardiovascular;  Laterality: Left;   LOWER EXTREMITY ANGIOGRAPHY Left 10/30/2017   Procedure: LOWER EXTREMITY ANGIOGRAPHY;  Surgeon: Annice Needy, MD;  Location: [facility];  Service: Cardiovascular;  Laterality: Left;   LOWER EXTREMITY ANGIOGRAPHY Right 03/08/2018   Procedure: LOWER EXTREMITY ANGIOGRAPHY;  Surgeon: Annice Needy, MD;  Location: [facility];  Service: Cardiovascular;  Laterality: Right;   LOWER EXTREMITY ANGIOGRAPHY Left 10/01/2018   Procedure: LOWER EXTREMITY ANGIOGRAPHY;  Surgeon: Annice Needy, MD;  Location: [facility];  Service: Cardiovascular;  Laterality: Left;   LOWER EXTREMITY ANGIOGRAPHY Right 10/08/2018   Procedure: LOWER EXTREMITY ANGIOGRAPHY;  Surgeon: Annice Needy, MD;  Location: [facility];  Service: Cardiovascular;  Laterality: Right;   LOWER EXTREMITY ANGIOGRAPHY Right 05/07/2020   Procedure: Lower Extremity Angiography;  Surgeon: Annice Needy, MD;  Location: [facility];  Service: Cardiovascular;  Laterality: Right;   LYSIS OF ADHESION  01/25/2021   Procedure: LYSIS OF ADHESION;  Surgeon: Campbell Lerner, MD;  Location: [facility];  Service: General;;   PERIPHERAL VASCULAR INTERVENTION  03/08/2018   Procedure: PERIPHERAL VASCULAR INTERVENTION;  Surgeon: Annice Needy, MD;  Location: [facility];  Service: Cardiovascular;;   PORTA CATH INSERTION N/A 02/17/2020   Procedure: PORTA CATH INSERTION;   Surgeon: Annice Needy, MD;  Location: [facility];  Service: Cardiovascular;  Laterality: N/A;   REDUCTION MAMMAPLASTY  1997   SACROPLASTY N/A 10/25/2018   Procedure: S1 SACROPLASTY;  Surgeon: Kennedy Bucker, MD;  Location: [facility];  Service: Orthopedics;  Laterality: N/A;     Social History   Tobacco Use   Smoking status: Former    Current packs/day: 0.00    Average packs/day: 1 pack/day for 20.0 years (20.0 ttl pk-yrs)    Types: Cigarettes    Start date: 03/07/1976    Quit date: 03/07/1996    Years since quitting: 26.5    Passive exposure: Past   Smokeless tobacco: Never   Tobacco comments:    started smoking at age 30 but stopped smoking in 2000  Vaping Use   Vaping status: Never Used  Substance Use Topics   Alcohol use: No    Alcohol/week: 0.0 standard drinks of alcohol   Drug use: No      Family History  Problem Relation Age of Onset   Coronary artery disease Father    Heart attack Father    Coronary artery disease Mother    Heart attack Mother    Ovarian cancer Sister 60       sister had hormonal therapy for IVF txs-which increased risk factor for ovarian cancer   Breast cancer Neg Hx      No Known Allergies   REVIEW OF SYSTEMS (Negative unless checked)   Constitutional: [] Weight loss  [] Fever  [] Chills Cardiac: [] Chest pain   [] Chest pressure   [] Palpitations   [] Shortness of breath when laying flat   [] Shortness of breath at rest   [x] Shortness of breath with exertion. Vascular:  [] Pain in legs with walking   [] Pain in legs at rest   [] Pain in legs when laying flat   [] Claudication   [] Pain in feet when walking  [] Pain in feet at rest  [] Pain in feet when laying flat   [] History of DVT   [] Phlebitis   [] Swelling in legs   [] Varicose veins   [] Non-healing  ulcers Pulmonary:   [] Uses home oxygen   [] Productive cough   [] Hemoptysis   [] Wheeze  [] COPD   [] Asthma Neurologic:  [] Dizziness  [] Blackouts   [] Seizures   [] History of stroke   [] History of TIA   [] Aphasia   [] Temporary blindness   [] Dysphagia   [] Weakness or numbness in arms   [] Weakness or numbness in legs Musculoskeletal:  [x] Arthritis   [] Joint swelling   [x] Joint pain   [] Low back pain Hematologic:  [] Easy bruising  [] Easy bleeding   [] Hypercoagulable state   [x] Anemic   Gastrointestinal:  [] Blood in stool   [] Vomiting blood  [x] Gastroesophageal reflux/heartburn   [] Abdominal pain Genitourinary:  [x] Chronic kidney disease   [] Difficult urination  [] Frequent urination  [] Burning with urination   [] Hematuria Skin:  [] Rashes   [] Ulcers   [] Wounds Psychological:  [x] History of anxiety   []  History of major depression.  Physical Examination  BP 131/68 (BP Location: Right Arm)   Pulse 69   Resp 18   Ht 5\' 4"  (1.626 m)   Wt 169 lb 12.8 oz (77 kg)   BMI 29.15 kg/m  Gen:  WD/WN, NAD Head: [place]/AT, No temporalis wasting. Ear/Nose/Throat: Hearing grossly intact, nares w/o erythema or drainage Eyes: Conjunctiva clear. Sclera non-icteric Neck: Supple.  Trachea midline Pulmonary:  Good air movement, no use of accessory muscles.  Cardiac: RRR, no JVD Vascular:  Vessel Right Left  Radial Palpable Palpable                          PT Palpable Palpable  DP Palpable Palpable   Gastrointestinal: soft, non-tender/non-distended. No guarding/reflex.  Musculoskeletal: M/S 5/5 throughout.  No deformity or atrophy. No edema. Neurologic: Sensation grossly intact in extremities.  Symmetrical.  Speech is fluent.  Psychiatric: Judgment intact, Mood & affect appropriate for pt's clinical situation. Dermatologic: No rashes or ulcers noted.  No cellulitis or open wounds.      Labs Recent Results (from the past 2160 hour(s))  Hemoglobin and Hematocrit (Cancer Center Only)     Status: Abnormal   Collection Time: 06/07/22 10:05 AM  Result Value Ref Range   Hemoglobin 10.0 (L) 12.0 - 15.0 g/dL   HCT 16.1 (L) 09.6 - 04.5 %    Comment: Performed at [facility], [address],  [place], [place] [address]  Sample to Blood Bank     Status: None   Collection Time: 06/07/22 10:05 AM  Result Value Ref Range   Blood Bank Specimen SAMPLE AVAILABLE FOR TESTING    Sample Expiration      06/10/2022,2359 Performed at [facility], [address], [place], [place] [address]   Sample to Blood Bank     Status: None   Collection Time: 06/21/22 10:05 AM  Result Value Ref Range   Blood Bank Specimen SAMPLE AVAILABLE FOR TESTING    Sample Expiration      06/24/2022,2359 Performed at [facility], [address] [place], [place] [address]   Hemoglobin and Hematocrit (Cancer Center Only)     Status: Abnormal   Collection Time: 06/21/22 10:05 AM  Result Value Ref Range   Hemoglobin 10.4 (L) 12.0 - 15.0 g/dL   HCT 21.3 (L) 08.6 - 57.8 %    Comment: Performed at [facility], [address] [place], [place] [address]  Hemoglobin and Hematocrit (Cancer Center Only)     Status: Abnormal   Collection Time: 07/05/22 10:09 AM  Result Value Ref Range  Hemoglobin 10.4 (L) 12.0 - 15.0 g/dL   HCT 95.2 (L) 84.1 - 32.4 %    Comment: Performed at [facility], [address] [place], [place] [address]  Sample to Blood Bank     Status: None   Collection Time: 07/05/22 10:09 AM  Result Value Ref Range   Blood Bank Specimen SAMPLE AVAILABLE FOR TESTING    Sample Expiration      07/08/2022,2359 Performed at [facility], [address] [place], [place] [address]   Hemoglobin and Hematocrit (Cancer Center Only)     Status: Abnormal   Collection Time: 07/26/22 12:53 PM  Result Value Ref Range   Hemoglobin 10.1 (L) 12.0 - 15.0 g/dL   HCT 64.4 (L) 03.4 - 74.2 %    Comment: Performed at [facility], [address] [place], [place] [address]  Sample to Blood Bank     Status: None   Collection Time: 07/26/22 12:53 PM  Result Value Ref Range   Blood Bank Specimen SAMPLE AVAILABLE FOR TESTING    Sample Expiration       07/29/2022,2359 Performed at [facility], [address] [place], [place] [address]   Ferritin     Status: None   Collection Time: 08/17/22  2:14 PM  Result Value Ref Range   Ferritin 15 11 - 307 ng/mL    Comment: Performed at [facility], [address] [place], [place] [address]  Iron and TIBC     Status: Abnormal   Collection Time: 08/17/22  2:14 PM  Result Value Ref Range   Iron 115 28 - 170 ug/dL   TIBC 188 416 - 606 ug/dL   Saturation Ratios 36 (H) 10.4 - 31.8 %   UIBC 201 ug/dL    Comment: Performed at [facility], [address] [place], [place] [address]  Basic metabolic panel     Status: Abnormal   Collection Time: 08/17/22  2:14 PM  Result Value Ref Range   Sodium 136 135 - 145 mmol/L   Potassium 4.5 3.5 - 5.1 mmol/L   Chloride 107 98 - 111 mmol/L   CO2 21 (L) 22 - 32 mmol/L   Glucose, Bld 269 (H) 70 - 99 mg/dL    Comment: Glucose reference range applies only to samples taken after fasting for at least 8 hours.   BUN 24 (H) 8 - 23 mg/dL   Creatinine, Ser 1.09 (H) 0.44 - 1.00 mg/dL   Calcium 8.0 (L) 8.9 - 10.3 mg/dL   GFR, Estimated 36 (L) >60 mL/min    Comment: (NOTE) Calculated using the CKD-EPI Creatinine Equation (2021)    Anion gap 8 5 - 15    Comment: Performed at [facility], [address] [place], [place] [address]  CBC with Differential (Cancer Center Only)     Status: Abnormal   Collection Time: 08/17/22  2:14 PM  Result Value Ref Range   WBC Count 4.6 4.0 - 10.5 K/uL   RBC 3.44 (L) 3.87 - 5.11 MIL/uL   Hemoglobin 10.3 (L) 12.0 - 15.0 g/dL   HCT 73.2 (L) 20.2 - 54.2 %   MCV 94.2 80.0 - 100.0 fL   MCH 29.9 26.0 - 34.0 pg   MCHC 31.8 30.0 - 36.0 g/dL   RDW 70.6 (H) 23.7 - 62.8 %   Platelet Count 327 150 - 400 K/uL   nRBC 0.0 0.0 - 0.2 %   Neutrophils Relative % 63 %   Neutro Abs  2.9 1.7 - 7.7 K/uL   Lymphocytes Relative 18 %   Lymphs Abs 0.8 0.7 - 4.0 K/uL   Monocytes Relative 16 %   Monocytes Absolute  0.7 0.1 - 1.0 K/uL   Eosinophils Relative 2 %   Eosinophils Absolute 0.1 0.0 - 0.5 K/uL   Basophils Relative 1 %   Basophils Absolute 0.0 0.0 - 0.1 K/uL   Immature Granulocytes 0 %   Abs Immature Granulocytes 0.01 0.00 - 0.07 K/uL    Comment: Performed at [facility], [address] [place], [place] [address]    Radiology Intravitreal Injection, Pharmacologic Agent - OD - Right Eye  Result Date: 08/29/2022 Time Out 08/29/2022. 3:21 PM. Confirmed correct patient, procedure, site, and patient consented. Anesthesia Topical anesthesia was used. Anesthetic medications included Lidocaine 2%, Proparacaine 0.5%. Procedure Preparation included 5% betadine to ocular surface, eyelid speculum. A (32 g) needle was used. Injection: 6 mg faricimab-svoa 6 MG/0.05ML   Route: Intravitreal, Site: Right Eye   NDC: O8010301, Lot: [id number], Expiration date: 09/02/2024, Waste: 0 mL Post-op Post injection exam found visual acuity of at least counting fingers. The patient tolerated the procedure well. There were no complications. The patient received written and verbal post procedure care education. Post injection medications were not given.   OCT, Retina - OU - Both Eyes  Result Date: 08/29/2022 Right Eye Quality was good. Central Foveal Thickness: 334. Progression has improved. Findings include no IRF, no SRF, abnormal foveal contour, epiretinal membrane (persistent IRF / cystic changes temporal fovea and macula -- slightly improved). Left Eye Quality was good. Central Foveal Thickness: 291. Progression has been stable. Findings include normal foveal contour, no IRF, no SRF. Notes *Images captured and stored on drive Diagnosis / Impression: OD: BRVO w/ CME -- persistent IRF / cystic changes temporal fovea and macula -- slightly improved OS: NFP; no IRF/SRF--stable Clinical management: See below Abbreviations: NFP - Normal foveal profile. CME - cystoid macular edema. PED - pigment epithelial detachment. IRF -  intraretinal fluid. SRF - subretinal fluid. EZ - ellipsoid zone. ERM - epiretinal membrane. ORA - outer retinal atrophy. ORT - outer retinal tubulation. SRHM - subretinal hyper-reflective material    Assessment/Plan Benign essential hypertension blood pressure control important in reducing the progression of atherosclerotic disease. On appropriate oral medications.     Type 2 diabetes mellitus with vascular disease (HCC) blood glucose control important in reducing the progression of atherosclerotic disease. Also, involved in wound healing. On appropriate medications.     Stage 3b chronic kidney disease (HCC) Avoid contrast unless absolutely necessary.   Hyperlipidemia, mixed lipid control important in reducing the progression of atherosclerotic disease. Continue statin therapy  Atherosclerosis of native arteries of extremity with rest pain (HCC) Noninvasive studies today show a right ABI of 1.12 and a left ABI of 1.06 with normal triphasic waveforms and normal digital pressures bilaterally.  Symptoms were resolved after extensive revascularization.  Perfusion remains intact at this point.  Given her high risk status I will continue to follow her on 75-month intervals.  Contact our office with any problems in the interim.    Festus Barren, MD  09/02/2022 11:41 AM    This note was created with Dragon medical transcription system.  Any errors from dictation are purely unintentional

## 2022-09-06 LAB — VAS US ABI WITH/WO TBI
Left ABI: 1.06
Right ABI: 1.12

## 2022-09-13 ENCOUNTER — Ambulatory Visit: Payer: PRIVATE HEALTH INSURANCE

## 2022-09-13 ENCOUNTER — Other Ambulatory Visit: Payer: PRIVATE HEALTH INSURANCE

## 2022-09-15 ENCOUNTER — Inpatient Hospital Stay: Payer: Medicare Other | Attending: Internal Medicine

## 2022-09-15 ENCOUNTER — Inpatient Hospital Stay: Payer: Medicare Other

## 2022-09-15 DIAGNOSIS — D649 Anemia, unspecified: Secondary | ICD-10-CM | POA: Diagnosis present

## 2022-09-15 DIAGNOSIS — N183 Chronic kidney disease, stage 3 unspecified: Secondary | ICD-10-CM

## 2022-09-15 LAB — SAMPLE TO BLOOD BANK

## 2022-09-15 LAB — HEMOGLOBIN AND HEMATOCRIT (CANCER CENTER ONLY)
HCT: 35.2 % — ABNORMAL LOW (ref 36.0–46.0)
Hemoglobin: 10.9 g/dL — ABNORMAL LOW (ref 12.0–15.0)

## 2022-09-15 NOTE — Progress Notes (Signed)
Patient did not get injection due to HbG being 10.9.

## 2022-09-21 NOTE — Progress Notes (Signed)
[facility] - Clinic Note  09/29/2022     CHIEF COMPLAINT Patient presents for Retina Follow Up   HISTORY OF PRESENT ILLNESS: Cheryl Leonard is a 77 y.o. female who presents to the clinic today for:    HPI     Retina Follow Up   Patient presents with  CRVO/BRVO.  In right eye.  Severity is mild.  Duration of 4 weeks.  Since onset it is stable.  I, the attending physician,  performed the HPI with the patient and updated documentation appropriately.        Comments   4 week Retina eval for Brvo od. Patient states no vision changes      Last edited by [name], MD on 09/29/2022  5:18 PM.      Referring physician: Danella Penton, MD [phone] [facility] [address] [facility] [place],  [place] [address]   HISTORICAL INFORMATION:   Selected notes from the MEDICAL RECORD NUMBER Referred by Dr. Senaida Ores for concern of DME OD Lab Results  Component Value Date   HGBA1C 7.3 (H) 01/11/2021     CURRENT MEDICATIONS: No current outpatient medications on file. (Ophthalmic Drugs)   No current facility-administered medications for this visit. (Ophthalmic Drugs)   Current Outpatient Medications (Other)  Medication Sig   ALPRAZolam (XANAX) 0.25 MG tablet Take 0.25 mg by mouth daily as needed for anxiety.   aspirin EC 81 MG tablet Take 1 tablet (81 mg total) by mouth daily.   cholecalciferol (VITAMIN D) 1000 units tablet Take 1,000 Units by mouth 2 (two) times daily.   dapagliflozin propanediol (FARXIGA) 5 MG TABS tablet Take 5 mg by mouth daily.   denosumab (PROLIA) 60 MG/ML SOLN injection Inject 60 mg into the skin every 6 (six) months.    escitalopram (LEXAPRO) 10 MG tablet Take 10 mg by mouth daily.   estradiol (ESTRACE) 0.1 MG/GM vaginal cream Place 1 Applicatorful vaginally daily as needed (vaginal irritation).   famotidine (PEPCID) 40 MG tablet Take 40 mg by mouth daily.   furosemide (LASIX) 20 MG tablet Take 20 mg by mouth every  Wednesday.   gabapentin (NEURONTIN) 300 MG capsule Take 300 mg by mouth at bedtime as needed (pain).   HYDROcodone-acetaminophen (NORCO/VICODIN) 5-325 MG tablet Take 1 tablet by mouth daily.   levothyroxine (SYNTHROID, LEVOTHROID) 100 MCG tablet Take 100 mcg by mouth daily before breakfast.    lidocaine-prilocaine (EMLA) cream Apply 1 application topically as needed (apply prior to port a cath access).   Magnesium 500 MG TABS Take 500 mg by mouth every morning.    metoprolol succinate (TOPROL-XL) 50 MG 24 hr tablet Take 50 mg by mouth daily. Take with or immediately following a meal.   mirtazapine (REMERON) 15 MG tablet Take 15 mg by mouth as needed.   Multiple Vitamin (MULTIVITAMIN WITH MINERALS) TABS tablet Take 1 tablet by mouth daily. Centrum Silver   NOVOLOG FLEXPEN 100 UNIT/ML FlexPen SMARTSIG:8 Unit(s) SUB-Q Morning-Evening   nystatin cream (MYCOSTATIN) Apply 1 application topically daily as needed (Yeast infection).   olmesartan (BENICAR) 20 MG tablet Take 20 mg by mouth daily.   pantoprazole (PROTONIX) 40 MG tablet Take 40 mg by mouth daily.   rivaroxaban (XARELTO) 20 MG TABS tablet Take 1 tablet (20 mg total) by mouth daily with supper.   rosuvastatin (CRESTOR) 20 MG tablet Take 20 mg by mouth every morning.   sodium bicarbonate 650 MG tablet Take 650 mg by mouth 2 (two) times  daily.   TRESIBA FLEXTOUCH 200 UNIT/ML SOPN Inject 25-30 Units as directed at bedtime. Titrate according to fasting blood glucose not to exceed 50 units a day   vitamin B-12 (CYANOCOBALAMIN) 1000 MCG tablet Take 1,000 mcg by mouth daily.   vitamin C (ASCORBIC ACID) 250 MG tablet Take 250 mg by mouth daily.   vitamin E 400 UNIT capsule Take 400 Units by mouth daily.   Zinc Sulfate (ZINC 15 PO) Take 1 tablet by mouth daily.   zolpidem (AMBIEN) 10 MG tablet Take 10 mg by mouth at bedtime.   No current facility-administered medications for this visit. (Other)   REVIEW OF SYSTEMS: ROS   Positive for:  Gastrointestinal, Musculoskeletal, Endocrine, Cardiovascular, Eyes, Respiratory Negative for: Constitutional, Neurological, Skin, Genitourinary, HENT, Psychiatric, Allergic/Imm, Heme/Lymph Last edited by Lana Fish, COT on 09/29/2022  1:00 PM.     ALLERGIES No Known Allergies  PAST MEDICAL HISTORY Past Medical History:  Diagnosis Date   Anemia    Anxiety    Arthritis    Gout   Cataracts, both eyes    Diabetic retinopathy (HCC)    NPDR OU   Diverticulitis    GERD (gastroesophageal reflux disease)    Gout    Headache    h/o migraines   History of fracture of patella    right knee   History of positive PPD    Patient always shows positive   Hyperlipidemia    Hypertension    Hypertensive retinopathy    OU   Hypothyroidism    Lichen sclerosus 12/30/2013   of vulva   Metatarsal fracture    Neuropathy    Osteopenia    Peripheral vascular disease (HCC)    Polyneuropathy    numbness and tingling in feet and toes   Renal insufficiency    Stage 3   Sleep apnea    does not use cpap-lost weight    Type 2 diabetes mellitus, uncontrolled    Past Surgical History:  Procedure Laterality Date   ABDOMINAL HYSTERECTOMY     AMPUTATION TOE Right 05/08/2020   Procedure: AMPUTATION TOE-Right 4th Toe;  Surgeon: Rosetta Posner, DPM;  Location: [facility];  Service: Podiatry;  Laterality: Right;   AMPUTATION TOE Left 04/22/2021   Procedure: AMPUTATION TOE - 4TH METARSOPHANGEAL JOINT;  Surgeon: Rosetta Posner, DPM;  Location: [facility];  Service: Podiatry;  Laterality: Left;   APPENDECTOMY     BREAST REDUCTION SURGERY  2001   CATARACT EXTRACTION     CESAREAN SECTION  1976   COLONOSCOPY  03/05/2013   Nml - due for repeat 03/06/2018   COLONOSCOPY WITH PROPOFOL N/A 03/18/2019   Procedure: COLONOSCOPY WITH PROPOFOL;  Surgeon: Toledo, [name], MD;  Location: [facility];  Service: Gastroenterology;  Laterality: N/A;   DIAGNOSTIC LAPAROSCOPY     DILATION AND CURETTAGE OF UTERUS  1989    ENDARTERECTOMY FEMORAL Bilateral 03/09/2018   Procedure: ENDARTERECTOMY FEMORAL;  Surgeon: Annice Needy, MD;  Location: [facility];  Service: Vascular;  Laterality: Bilateral;   ENDARTERECTOMY POPLITEAL Left 03/09/2018   Procedure: ENDARTERECTOMY POPLITEAL AND SFA;  Surgeon: Annice Needy, MD;  Location: [facility];  Service: Vascular;  Laterality: Left;   ESOPHAGOGASTRODUODENOSCOPY  03/05/2013   ESOPHAGOGASTRODUODENOSCOPY (EGD) WITH PROPOFOL N/A 03/18/2019   Procedure: ESOPHAGOGASTRODUODENOSCOPY (EGD) WITH PROPOFOL;  Surgeon: Toledo, [name], MD;  Location: [facility];  Service: Gastroenterology;  Laterality: N/A;   EYE SURGERY     Eyelid Surgery  2012   INTRAMEDULLARY (IM) NAIL INTERTROCHANTERIC  Left 10/30/2015   Procedure: INTRAMEDULLARY (IM) NAIL INTERTROCHANTRIC ;  Surgeon: Kennedy Bucker, MD;  Location: [facility];  Service: Orthopedics;  Laterality: Left;   KYPHOPLASTY N/A 10/25/2018   Procedure: L4 KYPHOPLASTY;  Surgeon: Kennedy Bucker, MD;  Location: [facility];  Service: Orthopedics;  Laterality: N/A;   LAPAROSCOPIC HYSTERECTOMY  2000   total   LOWER EXTREMITY ANGIOGRAPHY Left 03/08/2017   Procedure: LOWER EXTREMITY ANGIOGRAPHY;  Surgeon: Annice Needy, MD;  Location: [facility];  Service: Cardiovascular;  Laterality: Left;   LOWER EXTREMITY ANGIOGRAPHY Left 10/30/2017   Procedure: LOWER EXTREMITY ANGIOGRAPHY;  Surgeon: Annice Needy, MD;  Location: [facility];  Service: Cardiovascular;  Laterality: Left;   LOWER EXTREMITY ANGIOGRAPHY Right 03/08/2018   Procedure: LOWER EXTREMITY ANGIOGRAPHY;  Surgeon: Annice Needy, MD;  Location: [facility];  Service: Cardiovascular;  Laterality: Right;   LOWER EXTREMITY ANGIOGRAPHY Left 10/01/2018   Procedure: LOWER EXTREMITY ANGIOGRAPHY;  Surgeon: Annice Needy, MD;  Location: [facility];  Service: Cardiovascular;  Laterality: Left;   LOWER EXTREMITY ANGIOGRAPHY Right 10/08/2018   Procedure: LOWER EXTREMITY ANGIOGRAPHY;   Surgeon: Annice Needy, MD;  Location: [facility];  Service: Cardiovascular;  Laterality: Right;   LOWER EXTREMITY ANGIOGRAPHY Right 05/07/2020   Procedure: Lower Extremity Angiography;  Surgeon: Annice Needy, MD;  Location: [facility];  Service: Cardiovascular;  Laterality: Right;   LYSIS OF ADHESION  01/25/2021   Procedure: LYSIS OF ADHESION;  Surgeon: Campbell Lerner, MD;  Location: [facility];  Service: General;;   PERIPHERAL VASCULAR INTERVENTION  03/08/2018   Procedure: PERIPHERAL VASCULAR INTERVENTION;  Surgeon: Annice Needy, MD;  Location: [facility];  Service: Cardiovascular;;   PORTA CATH INSERTION N/A 02/17/2020   Procedure: PORTA CATH INSERTION;  Surgeon: Annice Needy, MD;  Location: [facility];  Service: Cardiovascular;  Laterality: N/A;   REDUCTION MAMMAPLASTY  1997   SACROPLASTY N/A 10/25/2018   Procedure: S1 SACROPLASTY;  Surgeon: Kennedy Bucker, MD;  Location: [facility];  Service: Orthopedics;  Laterality: N/A;   FAMILY HISTORY Family History  Problem Relation Age of Onset   Coronary artery disease Father    Heart attack Father    Coronary artery disease Mother    Heart attack Mother    Ovarian cancer Sister 83       sister had hormonal therapy for IVF txs-which increased risk factor for ovarian cancer   Breast cancer Neg Hx    SOCIAL HISTORY Social History   Tobacco Use   Smoking status: Former    Current packs/day: 0.00    Average packs/day: 1 pack/day for 20.0 years (20.0 ttl pk-yrs)    Types: Cigarettes    Start date: 03/07/1976    Quit date: 03/07/1996    Years since quitting: 26.5    Passive exposure: Past   Smokeless tobacco: Never   Tobacco comments:    started smoking at age 55 but stopped smoking in 2000  Vaping Use   Vaping status: Never Used  Substance Use Topics   Alcohol use: No    Alcohol/week: 0.0 standard drinks of alcohol   Drug use: No       OPHTHALMIC EXAM: Base Eye Exam     Visual Acuity (Snellen -  Linear)       Right Left   Dist [place] 20/25-3 20/20   Dist ph [employer] 20/NI          Tonometry (  Tonopen, 1:02 PM)       Right Left   Pressure 13 15         Pupils       Dark Light Shape React APD   Right 3 2 Round Brisk None   Left 3 2 Round Brisk None         Visual Fields (Counting fingers)       Left Right    Full Full         Extraocular Movement       Right Left    Full, Ortho Full, Ortho         Neuro/Psych     Oriented x3: Yes   Mood/Affect: Normal         Dilation     Both eyes: 1.0% Mydriacyl, 2.5% Phenylephrine @ 1:02 PM           Slit Lamp and Fundus Exam     External Exam       Right Left   External Normal Normal         Slit Lamp Exam       Right Left   Lids/Lashes dermatochalasis dermatochalasis   Conjunctiva/Sclera White and quiet White and quiet   Cornea arcus; well healed cataract wound; 2-3+ diffuse Punctate epithelial erosions, decreased TBUT, mild Anterior basement membrane dystrophy superiorly arcus; well healed cataract wound, 2-3+ diffuse Punctate epithelial erosions, irregualr epi surface, decreased TBUT   Anterior Chamber Deep and quiet Deep and quiet   Iris Round and dilated Round and dilated   Lens PCIOL; open PC PCIOL; open PC   Anterior Vitreous syneresis, Posterior vitreous detachment, vitreous condensations inferiorly syneresis, Posterior vitreous detachment         Fundus Exam       Right Left   Disc Superior hyperemia -- improved, mild Pallor Pink and Sharp   C/D Ratio 0.6 0.5   Macula Flat, Blunted foveal reflex, +cystic changes- improved, +Epiretinal membrane, minimal MA flat; good foveal reflex, no heme or edema, small pigment clump IT to fovea   Vessels attenuated, Tortuous attenuated, Tortuous   Periphery Attached; scattered DBH -- greastest temporal periphery- improved Attached, no heme           IMAGING AND PROCEDURES  Imaging and Procedures for 04/25/17  OCT, Retina - OU - Both Eyes        Right Eye Quality was good. Central Foveal Thickness: 311. Progression has improved. Findings include no IRF, no SRF, abnormal foveal contour, epiretinal membrane (Interval improvement in IRF / cystic changes temporal fovea and macula -- resolved).   Left Eye Quality was good. Central Foveal Thickness: 282. Progression has been stable. Findings include normal foveal contour, no IRF, no SRF.   Notes *Images captured and stored on drive  Diagnosis / Impression:  OD: BRVO w/ CME -- Interval improvement in IRF / cystic changes temporal fovea and macula -- resolved OS: NFP; no IRF/SRF--stable  Clinical management:  See below  Abbreviations: NFP - Normal foveal profile. CME - cystoid macular edema. PED - pigment epithelial detachment. IRF - intraretinal fluid. SRF - subretinal fluid. EZ - ellipsoid zone. ERM - epiretinal membrane. ORA - outer retinal atrophy. ORT - outer retinal tubulation. SRHM - subretinal hyper-reflective material      Intravitreal Injection, Pharmacologic Agent - OD - Right Eye       Time Out 09/29/2022. 1:56 PM. Confirmed correct patient, procedure, site, and patient consented.   Anesthesia Topical anesthesia was used. Anesthetic  medications included Lidocaine 2%, Proparacaine 0.5%.   Procedure Preparation included 5% betadine to ocular surface, eyelid speculum. A supplied (30 g) needle was used.   Injection: 6 mg faricimab-svoa 6 MG/0.05ML   Route: Intravitreal, Site: Right Eye   NDC: 19147-829-56, Lot: [id number], Expiration date: 10/03/2023, Waste: 0 mL   Post-op Post injection exam found visual acuity of at least counting fingers. The patient tolerated the procedure well. There were no complications. The patient received written and verbal post procedure care education. Post injection medications were not given.            ASSESSMENT/PLAN:    ICD-10-CM   1. Branch retinal vein occlusion of right eye with macular edema  H34.8310 OCT, Retina - OU  - Both Eyes    Intravitreal Injection, Pharmacologic Agent - OD - Right Eye    faricimab-svoa (VABYSMO) 6mg /0.63mL intravitreal injection    2. Both eyes affected by mild nonproliferative diabetic retinopathy with macular edema, associated with type 2 diabetes mellitus (HCC)  Q46.9629     3. Long term (current) use of oral hypoglycemic drugs  Z79.84     4. Current use of insulin (HCC)  Z79.4     5. Essential hypertension  I10     6. Hypertensive retinopathy of both eyes  H35.033     7. Epiretinal membrane (ERM) of right eye  H35.371     8. Pseudophakia of both eyes  Z96.1      1. BRVO w/ CME OD - by history, pt states symptoms first noticed 2 wks prior to presentation, but reports changes may have occurred prior  - initial exam with differential tortuosity of vessels (OD > OS) - FA (02.10.20) shows mild late staining / leakage in macula, staining / leakage of disc -- improving CME - differential includes DM2 (DME), hypertensive retinopathy, inflammatory etiology / uveitis - S/P IVA OD #1 (02.08.19), #2 (03.11.19), #3 (04.09.19), #4 (05.20.19), #5 (02.10.20) - gave IVA OD on 2.10.20 due to pending Eylea4U for 2020 -- resulted in increased IRF/CME - review of OCTs show persistent IRF and cystic changes --  resistance to IVA  - June 2019 -- switched therapies:  ========================================================== - S/P IVE OD #1 (06.24.19), #2 (07.24.19), #3 (09.04.19), #4 (10.30.19),#5 (12.30.19), #6 (03.23.20), #7 (05.05.20), #8 (07.16.20), #9 (07.17.20), #10 (08.28.20), #11 (10.13.20), # 12 (11.17.20), #13 (2.8.21), #14 (03.09.21), #15 (04.13.21), #16 (05.11.21), #17 (06.17.21), #18 (07.23.21), #19 (08.30.21), #20 (10.04.21), #21 (11.08.21), #22 (12.08.21), #23 (01.31.22), #24 (02.28.22), #25 (04.01.22), #26 (06.15.22), #27 (07.13.22), #28 (08.17.22), #29 (09.21.22), #30 (10.19.22), #31 (11.16.22), #32 (12.16.22), #33 (01.13.23), #34 (02.10.23), #35 (03.15.23), #36 (04.14.23), #37  (05.15.23), #38 (06.12.23), #39 (07.10.23), #40 (08.07.23), #41 (09.06.23), #42 (10.04.23), #43 (11.01.23) -- IVE resistance ========================================================== **interval increase in IRF at 5 weeks on 07.01.24 exam (IVV)** - s/p IVV OD #1 (12.04.23), #2 (01.03.24), #3 (01.31.24), #4 (02.28.24), #5 (03.29.24), #6 (04.24.24), #7 (05.28.24), #8 (07.01.24), #9 (07.29.24), #10 (08.26.24) - OCT today shows Interval improvement in IRF / cystic changes temporal fovea and macula -- resolved at 4 weeks  - BCVA OD 20/20 from 20/30  - recommend IVV OD #11 today, 09.26.24 w/ f/u in 4-5 weeks  - RBA of procedure discussed, questions answered  - see procedure note   - Vabysmo informed consent form obtained, signed and scanned on 12.04.23  - f/u 4-5 weeks  -- DFE/OCT/possible injection  2-4. Mild nonproliferative diabetic retinopathy, both eyes - A1c 6.5 (7.25.24), 6.2 (03.14.24), 6.2 (09.12.23); 6.6 ( 07.01.23); 6.0 (10.14.22)  -  could be contributing to CME OD  - OS with minimal diabetic retinopathy  - continue to monitor  5,6. Hypertensive retinopathy OU - stable  - as above, may have contributing to CME OD  - discussed importance of tight BP control  - monitor  7. Epiretinal membrane, right eye   - stable nasal ERM  - no indication for surgery at this time  8. Pseudophakia OU  - s/p CE/IOL OU by cataract surgeon in [facility]  - doing well  - monitor  Ophthalmic Meds Ordered this visit:  Meds ordered this encounter  Medications   faricimab-svoa (VABYSMO) 6mg /0.71mL intravitreal injection     Return for 4-5 wks - f/u BRVO OD , DFE, OCT, Possible, IVV, OD.  This document serves as a record of services personally performed by Karie Chimera, MD, PhD. It was created on their behalf by Glee Arvin. Manson Passey, OA an ophthalmic technician. The creation of this record is the provider's dictation and/or activities during the visit.    Electronically signed by: Glee Arvin. Manson Passey, OA  09/29/22 5:20 PM  This document serves as a record of services personally performed by Karie Chimera, MD, PhD. It was created on their behalf by Charlette Caffey, COT an ophthalmic technician. The creation of this record is the provider's dictation and/or activities during the visit.    Electronically signed by:  Charlette Caffey, COT  09/29/22 5:20 PM  Karie Chimera, M.D., Ph.D. Diseases & Surgery of the Retina and Vitreous [facility]  I have reviewed the above documentation for accuracy and completeness, and I agree with the above. Karie Chimera, M.D., Ph.D. 09/29/22 5:22 PM  Abbreviations: M myopia (nearsighted); A astigmatism; H hyperopia (farsighted); P presbyopia; Mrx spectacle prescription;  CTL contact lenses; OD right eye; OS left eye; OU both eyes  XT exotropia; ET esotropia; PEK punctate epithelial keratitis; PEE punctate epithelial erosions; DES dry eye syndrome; MGD meibomian gland dysfunction; ATs artificial tears; PFAT's preservative free artificial tears; NSC nuclear sclerotic cataract; PSC posterior subcapsular cataract; ERM epi-retinal membrane; PVD posterior vitreous detachment; RD retinal detachment; DM diabetes mellitus; DR diabetic retinopathy; NPDR non-proliferative diabetic retinopathy; PDR proliferative diabetic retinopathy; CSME clinically significant macular edema; DME diabetic macular edema; dbh dot blot hemorrhages; CWS cotton wool spot; POAG primary open angle glaucoma; C/D cup-to-disc ratio; HVF humphrey visual field; GVF goldmann visual field; OCT optical coherence tomography; IOP intraocular pressure; BRVO Branch retinal vein occlusion; CRVO central retinal vein occlusion; CRAO central retinal artery occlusion; BRAO branch retinal artery occlusion; RT retinal tear; SB scleral buckle; PPV pars plana vitrectomy; VH Vitreous hemorrhage; PRP panretinal laser photocoagulation; IVK intravitreal kenalog; VMT vitreomacular traction; MH Macular  hole;  NVD neovascularization of the disc; NVE neovascularization elsewhere; AREDS age related eye disease study; ARMD age related macular degeneration; POAG primary open angle glaucoma; EBMD epithelial/anterior basement membrane dystrophy; ACIOL anterior chamber intraocular lens; IOL intraocular lens; PCIOL posterior chamber intraocular lens; Phaco/IOL phacoemulsification with intraocular lens placement; PRK photorefractive keratectomy; LASIK laser assisted in situ keratomileusis; HTN hypertension; DM diabetes mellitus; COPD chronic obstructive pulmonary disease

## 2022-09-29 ENCOUNTER — Encounter (INDEPENDENT_AMBULATORY_CARE_PROVIDER_SITE_OTHER): Payer: Self-pay | Admitting: Ophthalmology

## 2022-09-29 ENCOUNTER — Ambulatory Visit (INDEPENDENT_AMBULATORY_CARE_PROVIDER_SITE_OTHER): Payer: Medicare Other | Admitting: Ophthalmology

## 2022-09-29 DIAGNOSIS — H34831 Tributary (branch) retinal vein occlusion, right eye, with macular edema: Secondary | ICD-10-CM | POA: Diagnosis not present

## 2022-09-29 DIAGNOSIS — H35033 Hypertensive retinopathy, bilateral: Secondary | ICD-10-CM

## 2022-09-29 DIAGNOSIS — Z961 Presence of intraocular lens: Secondary | ICD-10-CM

## 2022-09-29 DIAGNOSIS — E113213 Type 2 diabetes mellitus with mild nonproliferative diabetic retinopathy with macular edema, bilateral: Secondary | ICD-10-CM

## 2022-09-29 DIAGNOSIS — I10 Essential (primary) hypertension: Secondary | ICD-10-CM

## 2022-09-29 DIAGNOSIS — Z7984 Long term (current) use of oral hypoglycemic drugs: Secondary | ICD-10-CM

## 2022-09-29 DIAGNOSIS — H35371 Puckering of macula, right eye: Secondary | ICD-10-CM

## 2022-09-29 DIAGNOSIS — Z794 Long term (current) use of insulin: Secondary | ICD-10-CM | POA: Diagnosis not present

## 2022-09-29 MED ORDER — FARICIMAB-SVOA 6 MG/0.05ML IZ SOSY
6.0000 mg | PREFILLED_SYRINGE | INTRAVITREAL | Status: AC | PRN
Start: 2022-09-29 — End: 2022-09-29
  Administered 2022-09-29: 6 mg via INTRAVITREAL

## 2022-10-11 ENCOUNTER — Inpatient Hospital Stay: Payer: Medicare Other

## 2022-10-11 ENCOUNTER — Inpatient Hospital Stay: Payer: Medicare Other | Attending: Internal Medicine

## 2022-10-11 DIAGNOSIS — D631 Anemia in chronic kidney disease: Secondary | ICD-10-CM

## 2022-10-11 DIAGNOSIS — D649 Anemia, unspecified: Secondary | ICD-10-CM | POA: Diagnosis present

## 2022-10-11 LAB — SAMPLE TO BLOOD BANK

## 2022-10-11 LAB — HEMOGLOBIN AND HEMATOCRIT (CANCER CENTER ONLY)
HCT: 32.5 % — ABNORMAL LOW (ref 36.0–46.0)
Hemoglobin: 10.6 g/dL — ABNORMAL LOW (ref 12.0–15.0)

## 2022-10-11 NOTE — Progress Notes (Signed)
Patient HbG is a 10.6. Hold retacrit.

## 2022-10-27 NOTE — Progress Notes (Signed)
[facility] - Clinic Note  10/28/2022     CHIEF COMPLAINT Patient presents for Retina Follow Up   HISTORY OF PRESENT ILLNESS: Cheryl Leonard is a 77 y.o. female who presents to the clinic today for:    HPI     Retina Follow Up   Patient presents with  CRVO/BRVO.  In right eye.  This started 4 weeks ago.  I, the attending physician,  performed the HPI with the patient and updated documentation appropriately.        Comments   Patient here for 4 weeks retina follow up for BRVO OD. Patient states vision the same. No eye pain. 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       Last edited by [name], MD on 10/28/2022  4:31 PM.    Patient states the vision is stable.   Referring physician: Danella Penton, MD [phone] [facility] [address] [facility] [place],  [place] [address]   HISTORICAL INFORMATION:   Selected notes from the MEDICAL RECORD NUMBER Referred by Dr. Senaida Ores for concern of DME OD Lab Results  Component Value Date   HGBA1C 7.3 (H) 01/11/2021     CURRENT MEDICATIONS: No current outpatient medications on file. (Ophthalmic Drugs)   No current facility-administered medications for this visit. (Ophthalmic Drugs)   Current Outpatient Medications (Other)  Medication Sig   ALPRAZolam (XANAX) 0.25 MG tablet Take 0.25 mg by mouth daily as needed for anxiety.   aspirin EC 81 MG tablet Take 1 tablet (81 mg total) by mouth daily.   cholecalciferol (VITAMIN D) 1000 units tablet Take 1,000 Units by mouth 2 (two) times daily.   dapagliflozin propanediol (FARXIGA) 5 MG TABS tablet Take 5 mg by mouth daily.   denosumab (PROLIA) 60 MG/ML SOLN injection Inject 60 mg into the skin every 6 (six) months.    escitalopram (LEXAPRO) 10 MG tablet Take 10 mg by mouth daily.   estradiol (ESTRACE) 0.1 MG/GM vaginal cream Place 1 Applicatorful vaginally daily as needed (vaginal irritation).   famotidine (PEPCID) 40 MG tablet Take 40 mg by mouth  daily.   furosemide (LASIX) 20 MG tablet Take 20 mg by mouth every Wednesday.   gabapentin (NEURONTIN) 300 MG capsule Take 300 mg by mouth at bedtime as needed (pain).   HYDROcodone-acetaminophen (NORCO/VICODIN) 5-325 MG tablet Take 1 tablet by mouth daily.   levothyroxine (SYNTHROID, LEVOTHROID) 100 MCG tablet Take 100 mcg by mouth daily before breakfast.    lidocaine-prilocaine (EMLA) cream Apply 1 application topically as needed (apply prior to port a cath access).   Magnesium 500 MG TABS Take 500 mg by mouth every morning.    metoprolol succinate (TOPROL-XL) 50 MG 24 hr tablet Take 50 mg by mouth daily. Take with or immediately following a meal.   mirtazapine (REMERON) 15 MG tablet Take 15 mg by mouth as needed.   Multiple Vitamin (MULTIVITAMIN WITH MINERALS) TABS tablet Take 1 tablet by mouth daily. Centrum Silver   NOVOLOG FLEXPEN 100 UNIT/ML FlexPen SMARTSIG:8 Unit(s) SUB-Q Morning-Evening   nystatin cream (MYCOSTATIN) Apply 1 application topically daily as needed (Yeast infection).   olmesartan (BENICAR) 20 MG tablet Take 20 mg by mouth daily.   pantoprazole (PROTONIX) 40 MG tablet Take 40 mg by mouth daily.   rivaroxaban (XARELTO) 20 MG TABS tablet Take 1 tablet (20 mg total) by mouth daily with supper.   rosuvastatin (CRESTOR) 20 MG tablet Take 20 mg by mouth every morning.   sodium bicarbonate  650 MG tablet Take 650 mg by mouth 2 (two) times daily.   TRESIBA FLEXTOUCH 200 UNIT/ML SOPN Inject 25-30 Units as directed at bedtime. Titrate according to fasting blood glucose not to exceed 50 units a day   vitamin B-12 (CYANOCOBALAMIN) 1000 MCG tablet Take 1,000 mcg by mouth daily.   vitamin C (ASCORBIC ACID) 250 MG tablet Take 250 mg by mouth daily.   vitamin E 400 UNIT capsule Take 400 Units by mouth daily.   Zinc Sulfate (ZINC 15 PO) Take 1 tablet by mouth daily.   zolpidem (AMBIEN) 10 MG tablet Take 10 mg by mouth at bedtime.   No current facility-administered medications for this  visit. (Other)   REVIEW OF SYSTEMS: ROS   Positive for: Gastrointestinal, Musculoskeletal, Endocrine, Cardiovascular, Eyes, Respiratory Negative for: Constitutional, Neurological, Skin, Genitourinary, HENT, Psychiatric, Allergic/Imm, Heme/Lymph Last edited by Laddie Aquas, COA on 10/28/2022  2:02 PM.      ALLERGIES No Known Allergies  PAST MEDICAL HISTORY Past Medical History:  Diagnosis Date   Anemia    Anxiety    Arthritis    Gout   Cataracts, both eyes    Diabetic retinopathy (HCC)    NPDR OU   Diverticulitis    GERD (gastroesophageal reflux disease)    Gout    Headache    h/o migraines   History of fracture of patella    right knee   History of positive PPD    Patient always shows positive   Hyperlipidemia    Hypertension    Hypertensive retinopathy    OU   Hypothyroidism    Lichen sclerosus 12/30/2013   of vulva   Metatarsal fracture    Neuropathy    Osteopenia    Peripheral vascular disease (HCC)    Polyneuropathy    numbness and tingling in feet and toes   Renal insufficiency    Stage 3   Sleep apnea    does not use cpap-lost weight    Type 2 diabetes mellitus, uncontrolled    Past Surgical History:  Procedure Laterality Date   ABDOMINAL HYSTERECTOMY     AMPUTATION TOE Right 05/08/2020   Procedure: AMPUTATION TOE-Right 4th Toe;  Surgeon: Rosetta Posner, DPM;  Location: [facility];  Service: Podiatry;  Laterality: Right;   AMPUTATION TOE Left 04/22/2021   Procedure: AMPUTATION TOE - 4TH METARSOPHANGEAL JOINT;  Surgeon: Rosetta Posner, DPM;  Location: [facility];  Service: Podiatry;  Laterality: Left;   APPENDECTOMY     BREAST REDUCTION SURGERY  2001   CATARACT EXTRACTION     CESAREAN SECTION  1976   COLONOSCOPY  03/05/2013   Nml - due for repeat 03/06/2018   COLONOSCOPY WITH PROPOFOL N/A 03/18/2019   Procedure: COLONOSCOPY WITH PROPOFOL;  Surgeon: Toledo, [name], MD;  Location: [facility];  Service: Gastroenterology;  Laterality: N/A;    DIAGNOSTIC LAPAROSCOPY     DILATION AND CURETTAGE OF UTERUS  1989   ENDARTERECTOMY FEMORAL Bilateral 03/09/2018   Procedure: ENDARTERECTOMY FEMORAL;  Surgeon: Annice Needy, MD;  Location: [facility];  Service: Vascular;  Laterality: Bilateral;   ENDARTERECTOMY POPLITEAL Left 03/09/2018   Procedure: ENDARTERECTOMY POPLITEAL AND SFA;  Surgeon: Annice Needy, MD;  Location: [facility];  Service: Vascular;  Laterality: Left;   ESOPHAGOGASTRODUODENOSCOPY  03/05/2013   ESOPHAGOGASTRODUODENOSCOPY (EGD) WITH PROPOFOL N/A 03/18/2019   Procedure: ESOPHAGOGASTRODUODENOSCOPY (EGD) WITH PROPOFOL;  Surgeon: Toledo, [name], MD;  Location: [facility];  Service: Gastroenterology;  Laterality: N/A;   EYE SURGERY  Eyelid Surgery  2012   INTRAMEDULLARY (IM) NAIL INTERTROCHANTERIC Left 10/30/2015   Procedure: INTRAMEDULLARY (IM) NAIL INTERTROCHANTRIC ;  Surgeon: Kennedy Bucker, MD;  Location: [facility];  Service: Orthopedics;  Laterality: Left;   KYPHOPLASTY N/A 10/25/2018   Procedure: L4 KYPHOPLASTY;  Surgeon: Kennedy Bucker, MD;  Location: [facility];  Service: Orthopedics;  Laterality: N/A;   LAPAROSCOPIC HYSTERECTOMY  2000   total   LOWER EXTREMITY ANGIOGRAPHY Left 03/08/2017   Procedure: LOWER EXTREMITY ANGIOGRAPHY;  Surgeon: Annice Needy, MD;  Location: [facility];  Service: Cardiovascular;  Laterality: Left;   LOWER EXTREMITY ANGIOGRAPHY Left 10/30/2017   Procedure: LOWER EXTREMITY ANGIOGRAPHY;  Surgeon: Annice Needy, MD;  Location: [facility];  Service: Cardiovascular;  Laterality: Left;   LOWER EXTREMITY ANGIOGRAPHY Right 03/08/2018   Procedure: LOWER EXTREMITY ANGIOGRAPHY;  Surgeon: Annice Needy, MD;  Location: [facility];  Service: Cardiovascular;  Laterality: Right;   LOWER EXTREMITY ANGIOGRAPHY Left 10/01/2018   Procedure: LOWER EXTREMITY ANGIOGRAPHY;  Surgeon: Annice Needy, MD;  Location: [facility];  Service: Cardiovascular;  Laterality: Left;   LOWER EXTREMITY  ANGIOGRAPHY Right 10/08/2018   Procedure: LOWER EXTREMITY ANGIOGRAPHY;  Surgeon: Annice Needy, MD;  Location: [facility];  Service: Cardiovascular;  Laterality: Right;   LOWER EXTREMITY ANGIOGRAPHY Right 05/07/2020   Procedure: Lower Extremity Angiography;  Surgeon: Annice Needy, MD;  Location: [facility];  Service: Cardiovascular;  Laterality: Right;   LYSIS OF ADHESION  01/25/2021   Procedure: LYSIS OF ADHESION;  Surgeon: Campbell Lerner, MD;  Location: [facility];  Service: General;;   PERIPHERAL VASCULAR INTERVENTION  03/08/2018   Procedure: PERIPHERAL VASCULAR INTERVENTION;  Surgeon: Annice Needy, MD;  Location: [facility];  Service: Cardiovascular;;   PORTA CATH INSERTION N/A 02/17/2020   Procedure: PORTA CATH INSERTION;  Surgeon: Annice Needy, MD;  Location: [facility];  Service: Cardiovascular;  Laterality: N/A;   REDUCTION MAMMAPLASTY  1997   SACROPLASTY N/A 10/25/2018   Procedure: S1 SACROPLASTY;  Surgeon: Kennedy Bucker, MD;  Location: [facility];  Service: Orthopedics;  Laterality: N/A;   FAMILY HISTORY Family History  Problem Relation Age of Onset   Coronary artery disease Father    Heart attack Father    Coronary artery disease Mother    Heart attack Mother    Ovarian cancer Sister 75       sister had hormonal therapy for IVF txs-which increased risk factor for ovarian cancer   Breast cancer Neg Hx    SOCIAL HISTORY Social History   Tobacco Use   Smoking status: Former    Current packs/day: 0.00    Average packs/day: 1 pack/day for 20.0 years (20.0 ttl pk-yrs)    Types: Cigarettes    Start date: 03/07/1976    Quit date: 03/07/1996    Years since quitting: 26.6    Passive exposure: Past   Smokeless tobacco: Never   Tobacco comments:    started smoking at age 55 but stopped smoking in 2000  Vaping Use   Vaping status: Never Used  Substance Use Topics   Alcohol use: No    Alcohol/week: 0.0 standard drinks of alcohol   Drug use: No        OPHTHALMIC EXAM: Base Eye Exam     Visual Acuity (Snellen - Linear)       Right Left   Dist [place] 20/25 -2 20/20   Dist ph [place]  NI          Tonometry (Tonopen, 2:00 PM)       Right Left   Pressure 17 18         Pupils       Dark Light Shape React APD   Right 3 2 Round Brisk None   Left 3 2 Round Brisk None         Visual Fields (Counting fingers)       Left Right    Full Full         Extraocular Movement       Right Left    Full, Ortho Full, Ortho         Neuro/Psych     Oriented x3: Yes   Mood/Affect: Normal         Dilation     Both eyes: 1.0% Mydriacyl, 2.5% Phenylephrine @ 2:00 PM           Slit Lamp and Fundus Exam     External Exam       Right Left   External Normal Normal         Slit Lamp Exam       Right Left   Lids/Lashes dermatochalasis dermatochalasis   Conjunctiva/Sclera White and quiet White and quiet   Cornea arcus; well healed cataract wound; 2-3+ diffuse Punctate epithelial erosions, decreased TBUT, mild Anterior basement membrane dystrophy superiorly arcus; well healed cataract wound, 2-3+ diffuse Punctate epithelial erosions, irregualr epi surface, decreased TBUT   Anterior Chamber Deep and quiet Deep and quiet   Iris Round and dilated Round and dilated   Lens PCIOL; open PC PCIOL; open PC   Anterior Vitreous syneresis, Posterior vitreous detachment, vitreous condensations inferiorly syneresis, Posterior vitreous detachment         Fundus Exam       Right Left   Disc Superior hyperemia -- improved, mild Pallor Pink and Sharp   C/D Ratio 0.6 0.5   Macula Flat, Blunted foveal reflex, +cystic changes- stably improved, +Epiretinal membrane, minimal MA flat; good foveal reflex, no heme or edema, small pigment clump IT to fovea   Vessels attenuated, Tortuous attenuated, Tortuous   Periphery Attached; scattered DBH -- greastest temporal periphery- improved Attached, no heme           IMAGING AND  PROCEDURES  Imaging and Procedures for 04/25/17  OCT, Retina - OU - Both Eyes       Right Eye Quality was good. Central Foveal Thickness: 319. Progression has been stable. Findings include no IRF, no SRF, abnormal foveal contour, epiretinal membrane (Stable resolution of IRF / cystic changes temporal fovea and macula ).   Left Eye Quality was good. Central Foveal Thickness: 284. Progression has been stable. Findings include normal foveal contour, no IRF, no SRF.   Notes *Images captured and stored on drive  Diagnosis / Impression:  OD: Stable resolution of IRF / cystic changes temporal fovea and macula  OS: NFP; no IRF/SRF--stable  Clinical management:  See below  Abbreviations: NFP - Normal foveal profile. CME - cystoid macular edema. PED - pigment epithelial detachment. IRF - intraretinal fluid. SRF - subretinal fluid. EZ - ellipsoid zone. ERM - epiretinal membrane. ORA - outer retinal atrophy. ORT - outer retinal tubulation. SRHM - subretinal hyper-reflective material      Intravitreal Injection, Pharmacologic Agent - OD - Right Eye       Time Out 10/28/2022. 3:11 PM. Confirmed correct patient, procedure, site, and patient consented.  Anesthesia Topical anesthesia was used. Anesthetic medications included Lidocaine 2%, Proparacaine 0.5%.   Procedure Preparation included 5% betadine to ocular surface, eyelid speculum. A supplied (30 g) needle was used.   Injection: 6 mg faricimab-svoa 6 MG/0.05ML   Route: Intravitreal, Site: Right Eye   NDC: R2083049, Lot: [id number], Expiration date: 04/02/2024, Waste: 0 mL   Post-op Post injection exam found visual acuity of at least counting fingers. The patient tolerated the procedure well. There were no complications. The patient received written and verbal post procedure care education. Post injection medications were not given.            ASSESSMENT/PLAN:    ICD-10-CM   1. Branch retinal vein occlusion of right eye  with macular edema  H34.8310 OCT, Retina - OU - Both Eyes    Intravitreal Injection, Pharmacologic Agent - OD - Right Eye    faricimab-svoa (VABYSMO) 6mg /0.40mL intravitreal injection    2. Both eyes affected by mild nonproliferative diabetic retinopathy with macular edema, associated with type 2 diabetes mellitus (HCC)  Y60.6301     3. Long term (current) use of oral hypoglycemic drugs  Z79.84     4. Current use of insulin (HCC)  Z79.4     5. Essential hypertension  I10     6. Hypertensive retinopathy of both eyes  H35.033     7. Epiretinal membrane (ERM) of right eye  H35.371     8. Pseudophakia of both eyes  Z96.1      1. BRVO w/ CME OD - by history, pt states symptoms first noticed 2 wks prior to presentation, but reports changes may have occurred prior  - initial exam with differential tortuosity of vessels (OD > OS) - FA (02.10.20) shows mild late staining / leakage in macula, staining / leakage of disc -- improving CME - differential includes DM2 (DME), hypertensive retinopathy, inflammatory etiology / uveitis - S/P IVA OD #1 (02.08.19), #2 (03.11.19), #3 (04.09.19), #4 (05.20.19), #5 (02.10.20) - gave IVA OD on 2.10.20 due to pending Eylea4U for 2020 -- resulted in increased IRF/CME - review of OCT show persistent IRF and cystic changes --  resistance to IVA  - June 2019 -- switched therapies:  ========================================================== - S/P IVE OD #1 (06.24.19), #2 (07.24.19), #3 (09.04.19), #4 (10.30.19),#5 (12.30.19), #6 (03.23.20), #7 (05.05.20), #8 (07.16.20), #9 (07.17.20), #10 (08.28.20), #11 (10.13.20), # 12 (11.17.20), #13 (2.8.21), #14 (03.09.21), #15 (04.13.21), #16 (05.11.21), #17 (06.17.21), #18 (07.23.21), #19 (08.30.21), #20 (10.04.21), #21 (11.08.21), #22 (12.08.21), #23 (01.31.22), #24 (02.28.22), #25 (04.01.22), #26 (06.15.22), #27 (07.13.22), #28 (08.17.22), #29 (09.21.22), #30 (10.19.22), #31 (11.16.22), #32 (12.16.22), #33 (01.13.23), #34  (02.10.23), #35 (03.15.23), #36 (04.14.23), #37 (05.15.23), #38 (06.12.23), #39 (07.10.23), #40 (08.07.23), #41 (09.06.23), #42 (10.04.23), #43 (11.01.23) -- IVE resistance ========================================================== **interval increase in IRF at 5 weeks on 07.01.24 exam (IVV)** - s/p IVV OD #1 (12.04.23), #2 (01.03.24), #3 (01.31.24), #4 (02.28.24), #5 (03.29.24), #6 (04.24.24), #7 (05.28.24), #8 (07.01.24), #9 (07.29.24), #10 (08.26.24) #11(09.26.24) - OCT today shows Stable resolution in IRF / cystic changes temporal fovea and macula at 4 weeks  - BCVA OD 20/25  - recommend IVV OD #12 today, 10.25.24 w/ f/u in 5 weeks  - RBA of procedure discussed, questions answered  - see procedure note   - Vabysmo informed consent form obtained, signed and scanned on 12.04.23  - f/u 5 weeks  -- DFE/OCT/possible injection  2-4. Mild nonproliferative diabetic retinopathy, both eyes - A1c 6.5 (7.25.24), 6.2 (03.14.24), 6.2 (09.12.23); 6.6 (  07.01.23); 6.0 (10.14.22)  - could be contributing to CME OD  - OS with minimal diabetic retinopathy  - continue to monitor  5,6. Hypertensive retinopathy OU - stable  - as above, may have contributing to CME OD  - discussed importance of tight BP control  - monitor  7. Epiretinal membrane, right eye   - stable nasal ERM  - no indication for surgery at this time  8. Pseudophakia OU  - s/p CE/IOL OU by cataract surgeon in [facility]  - doing well  - monitor  Ophthalmic Meds Ordered this visit:  Meds ordered this encounter  Medications   faricimab-svoa (VABYSMO) 6mg /0.28mL intravitreal injection     Return in about 5 weeks (around 12/02/2022) for f/u BRVO OD , DFE, OCT, Possible, IVV, OD.  This document serves as a record of services personally performed by Karie Chimera, MD, PhD. It was created on their behalf by Berlin Hun COT, an ophthalmic technician. The creation of this record is the provider's dictation and/or activities during the  visit.    Electronically signed by: Berlin Hun COT 10.24.24 4:32 PM  This document serves as a record of services personally performed by Karie Chimera, MD, PhD. It was created on their behalf by Charlette Caffey, COT an ophthalmic technician. The creation of this record is the provider's dictation and/or activities during the visit.    Electronically signed by:  Charlette Caffey, COT  10/28/22 4:32 PM  Karie Chimera, M.D., Ph.D. Diseases & Surgery of the Retina and Vitreous [facility] 10/28/2022   I have reviewed the above documentation for accuracy and completeness, and I agree with the above. Karie Chimera, M.D., Ph.D. 10/28/22 4:33 PM  Abbreviations: M myopia (nearsighted); A astigmatism; H hyperopia (farsighted); P presbyopia; Mrx spectacle prescription;  CTL contact lenses; OD right eye; OS left eye; OU both eyes  XT exotropia; ET esotropia; PEK punctate epithelial keratitis; PEE punctate epithelial erosions; DES dry eye syndrome; MGD meibomian gland dysfunction; ATs artificial tears; PFAT's preservative free artificial tears; NSC nuclear sclerotic cataract; PSC posterior subcapsular cataract; ERM epi-retinal membrane; PVD posterior vitreous detachment; RD retinal detachment; DM diabetes mellitus; DR diabetic retinopathy; NPDR non-proliferative diabetic retinopathy; PDR proliferative diabetic retinopathy; CSME clinically significant macular edema; DME diabetic macular edema; dbh dot blot hemorrhages; CWS cotton wool spot; POAG primary open angle glaucoma; C/D cup-to-disc ratio; HVF humphrey visual field; GVF goldmann visual field; OCT optical coherence tomography; IOP intraocular pressure; BRVO Branch retinal vein occlusion; CRVO central retinal vein occlusion; CRAO central retinal artery occlusion; BRAO branch retinal artery occlusion; RT retinal tear; SB scleral buckle; PPV pars plana vitrectomy; VH Vitreous hemorrhage; PRP panretinal laser  photocoagulation; IVK intravitreal kenalog; VMT vitreomacular traction; MH Macular hole;  NVD neovascularization of the disc; NVE neovascularization elsewhere; AREDS age related eye disease study; ARMD age related macular degeneration; POAG primary open angle glaucoma; EBMD epithelial/anterior basement membrane dystrophy; ACIOL anterior chamber intraocular lens; IOL intraocular lens; PCIOL posterior chamber intraocular lens; Phaco/IOL phacoemulsification with intraocular lens placement; PRK photorefractive keratectomy; LASIK laser assisted in situ keratomileusis; HTN hypertension; DM diabetes mellitus; COPD chronic obstructive pulmonary disease

## 2022-10-28 ENCOUNTER — Encounter (INDEPENDENT_AMBULATORY_CARE_PROVIDER_SITE_OTHER): Payer: Self-pay | Admitting: Ophthalmology

## 2022-10-28 ENCOUNTER — Ambulatory Visit (INDEPENDENT_AMBULATORY_CARE_PROVIDER_SITE_OTHER): Payer: Medicare Other | Admitting: Ophthalmology

## 2022-10-28 DIAGNOSIS — Z7984 Long term (current) use of oral hypoglycemic drugs: Secondary | ICD-10-CM

## 2022-10-28 DIAGNOSIS — Z961 Presence of intraocular lens: Secondary | ICD-10-CM

## 2022-10-28 DIAGNOSIS — H35033 Hypertensive retinopathy, bilateral: Secondary | ICD-10-CM

## 2022-10-28 DIAGNOSIS — E113213 Type 2 diabetes mellitus with mild nonproliferative diabetic retinopathy with macular edema, bilateral: Secondary | ICD-10-CM

## 2022-10-28 DIAGNOSIS — H35371 Puckering of macula, right eye: Secondary | ICD-10-CM

## 2022-10-28 DIAGNOSIS — H34831 Tributary (branch) retinal vein occlusion, right eye, with macular edema: Secondary | ICD-10-CM | POA: Diagnosis not present

## 2022-10-28 DIAGNOSIS — Z794 Long term (current) use of insulin: Secondary | ICD-10-CM

## 2022-10-28 DIAGNOSIS — I10 Essential (primary) hypertension: Secondary | ICD-10-CM

## 2022-10-28 MED ORDER — FARICIMAB-SVOA 6 MG/0.05ML IZ SOSY
6.0000 mg | PREFILLED_SYRINGE | INTRAVITREAL | Status: AC | PRN
Start: 2022-10-28 — End: 2022-10-28
  Administered 2022-10-28: 6 mg via INTRAVITREAL

## 2022-11-01 ENCOUNTER — Encounter: Payer: Self-pay | Admitting: Internal Medicine

## 2022-11-08 ENCOUNTER — Inpatient Hospital Stay: Payer: Medicare Other | Attending: Internal Medicine

## 2022-11-08 ENCOUNTER — Inpatient Hospital Stay: Payer: Medicare Other

## 2022-11-08 DIAGNOSIS — Z452 Encounter for adjustment and management of vascular access device: Secondary | ICD-10-CM | POA: Diagnosis not present

## 2022-11-08 DIAGNOSIS — D649 Anemia, unspecified: Secondary | ICD-10-CM | POA: Insufficient documentation

## 2022-11-08 DIAGNOSIS — N183 Chronic kidney disease, stage 3 unspecified: Secondary | ICD-10-CM

## 2022-11-08 DIAGNOSIS — Z95828 Presence of other vascular implants and grafts: Secondary | ICD-10-CM

## 2022-11-08 LAB — SAMPLE TO BLOOD BANK

## 2022-11-08 LAB — HEMOGLOBIN AND HEMATOCRIT (CANCER CENTER ONLY)
HCT: 34.1 % — ABNORMAL LOW (ref 36.0–46.0)
Hemoglobin: 11.2 g/dL — ABNORMAL LOW (ref 12.0–15.0)

## 2022-11-08 MED ORDER — HEPARIN SOD (PORK) LOCK FLUSH 100 UNIT/ML IV SOLN
500.0000 [IU] | Freq: Once | INTRAVENOUS | Status: AC
  Administered 2022-11-08: 500 [IU] via INTRAVENOUS
  Filled 2022-11-08: qty 5

## 2022-11-08 MED ORDER — SODIUM CHLORIDE 0.9% FLUSH
10.0000 mL | Freq: Once | INTRAVENOUS | Status: AC
  Administered 2022-11-08: 10 mL via INTRAVENOUS
  Filled 2022-11-08: qty 10

## 2022-11-15 NOTE — Progress Notes (Signed)
[facility] - Clinic Note  11/28/2022     CHIEF COMPLAINT Patient presents for Retina Follow Up   HISTORY OF PRESENT ILLNESS: Cheryl Leonard is a 77 y.o. female who presents to the clinic today for:    HPI     Retina Follow Up   Patient presents with  CRVO/BRVO.  In both eyes.  This started 4 weeks ago.  Duration of 4 weeks.  Since onset it is stable.  I, the attending physician,  performed the HPI with the patient and updated documentation appropriately.        Comments   4 week retina follow up  BRVO OD and IVV OD  pt is reporting no vision changes noticed she denies any flashes or floaters       Last edited by [name], MD on 11/28/2022  4:26 PM.     Patient states  Referring physician: Danella Penton, MD [phone] [facility] [address] [facility] [place],  [place] [address]   HISTORICAL INFORMATION:   Selected notes from the MEDICAL RECORD NUMBER Referred by Dr. Senaida Ores for concern of DME OD Lab Results  Component Value Date   HGBA1C 7.3 (H) 01/11/2021     CURRENT MEDICATIONS: No current outpatient medications on file. (Ophthalmic Drugs)   No current facility-administered medications for this visit. (Ophthalmic Drugs)   Current Outpatient Medications (Other)  Medication Sig   ALPRAZolam (XANAX) 0.25 MG tablet Take 0.25 mg by mouth daily as needed for anxiety.   aspirin EC 81 MG tablet Take 1 tablet (81 mg total) by mouth daily.   cholecalciferol (VITAMIN D) 1000 units tablet Take 1,000 Units by mouth 2 (two) times daily.   dapagliflozin propanediol (FARXIGA) 5 MG TABS tablet Take 5 mg by mouth daily.   denosumab (PROLIA) 60 MG/ML SOLN injection Inject 60 mg into the skin every 6 (six) months.    escitalopram (LEXAPRO) 10 MG tablet Take 10 mg by mouth daily.   estradiol (ESTRACE) 0.1 MG/GM vaginal cream Place 1 Applicatorful vaginally daily as needed (vaginal irritation).   famotidine (PEPCID) 40 MG tablet Take  40 mg by mouth daily.   furosemide (LASIX) 20 MG tablet Take 20 mg by mouth every Wednesday.   gabapentin (NEURONTIN) 300 MG capsule Take 300 mg by mouth at bedtime as needed (pain).   HYDROcodone-acetaminophen (NORCO/VICODIN) 5-325 MG tablet Take 1 tablet by mouth daily.   levothyroxine (SYNTHROID, LEVOTHROID) 100 MCG tablet Take 100 mcg by mouth daily before breakfast.    lidocaine-prilocaine (EMLA) cream Apply 1 application topically as needed (apply prior to port a cath access).   Magnesium 500 MG TABS Take 500 mg by mouth every morning.    metoprolol succinate (TOPROL-XL) 50 MG 24 hr tablet Take 50 mg by mouth daily. Take with or immediately following a meal.   mirtazapine (REMERON) 15 MG tablet Take 15 mg by mouth as needed.   Multiple Vitamin (MULTIVITAMIN WITH MINERALS) TABS tablet Take 1 tablet by mouth daily. Centrum Silver   NOVOLOG FLEXPEN 100 UNIT/ML FlexPen SMARTSIG:8 Unit(s) SUB-Q Morning-Evening   nystatin cream (MYCOSTATIN) Apply 1 application topically daily as needed (Yeast infection).   olmesartan (BENICAR) 20 MG tablet Take 20 mg by mouth daily.   pantoprazole (PROTONIX) 40 MG tablet Take 40 mg by mouth daily.   rivaroxaban (XARELTO) 20 MG TABS tablet Take 1 tablet (20 mg total) by mouth daily with supper.   rosuvastatin (CRESTOR) 20 MG tablet Take 20 mg by  mouth every morning.   sodium bicarbonate 650 MG tablet Take 650 mg by mouth 2 (two) times daily.   TRESIBA FLEXTOUCH 200 UNIT/ML SOPN Inject 25-30 Units as directed at bedtime. Titrate according to fasting blood glucose not to exceed 50 units a day   vitamin B-12 (CYANOCOBALAMIN) 1000 MCG tablet Take 1,000 mcg by mouth daily.   vitamin C (ASCORBIC ACID) 250 MG tablet Take 250 mg by mouth daily.   vitamin E 400 UNIT capsule Take 400 Units by mouth daily.   Zinc Sulfate (ZINC 15 PO) Take 1 tablet by mouth daily.   zolpidem (AMBIEN) 10 MG tablet Take 10 mg by mouth at bedtime.   No current facility-administered  medications for this visit. (Other)   REVIEW OF SYSTEMS: ROS   Positive for: Gastrointestinal, Musculoskeletal, Endocrine, Cardiovascular, Eyes, Respiratory Negative for: Constitutional, Neurological, Skin, Genitourinary, HENT, Psychiatric, Allergic/Imm, Heme/Lymph Last edited by Etheleen Mayhew, COT on 11/28/2022  2:26 PM.     ALLERGIES No Known Allergies  PAST MEDICAL HISTORY Past Medical History:  Diagnosis Date   Anemia    Anxiety    Arthritis    Gout   Cataracts, both eyes    Diabetic retinopathy (HCC)    NPDR OU   Diverticulitis    GERD (gastroesophageal reflux disease)    Gout    Headache    h/o migraines   History of fracture of patella    right knee   History of positive PPD    Patient always shows positive   Hyperlipidemia    Hypertension    Hypertensive retinopathy    OU   Hypothyroidism    Lichen sclerosus 12/30/2013   of vulva   Metatarsal fracture    Neuropathy    Osteopenia    Peripheral vascular disease (HCC)    Polyneuropathy    numbness and tingling in feet and toes   Renal insufficiency    Stage 3   Sleep apnea    does not use cpap-lost weight    Type 2 diabetes mellitus, uncontrolled    Past Surgical History:  Procedure Laterality Date   ABDOMINAL HYSTERECTOMY     AMPUTATION TOE Right 05/08/2020   Procedure: AMPUTATION TOE-Right 4th Toe;  Surgeon: Rosetta Posner, DPM;  Location: [facility];  Service: Podiatry;  Laterality: Right;   AMPUTATION TOE Left 04/22/2021   Procedure: AMPUTATION TOE - 4TH METARSOPHANGEAL JOINT;  Surgeon: Rosetta Posner, DPM;  Location: [facility];  Service: Podiatry;  Laterality: Left;   APPENDECTOMY     BREAST REDUCTION SURGERY  2001   CATARACT EXTRACTION     CESAREAN SECTION  1976   COLONOSCOPY  03/05/2013   Nml - due for repeat 03/06/2018   COLONOSCOPY WITH PROPOFOL N/A 03/18/2019   Procedure: COLONOSCOPY WITH PROPOFOL;  Surgeon: Toledo, [name], MD;  Location: [facility];  Service: Gastroenterology;   Laterality: N/A;   DIAGNOSTIC LAPAROSCOPY     DILATION AND CURETTAGE OF UTERUS  1989   ENDARTERECTOMY FEMORAL Bilateral 03/09/2018   Procedure: ENDARTERECTOMY FEMORAL;  Surgeon: Annice Needy, MD;  Location: [facility];  Service: Vascular;  Laterality: Bilateral;   ENDARTERECTOMY POPLITEAL Left 03/09/2018   Procedure: ENDARTERECTOMY POPLITEAL AND SFA;  Surgeon: Annice Needy, MD;  Location: [facility];  Service: Vascular;  Laterality: Left;   ESOPHAGOGASTRODUODENOSCOPY  03/05/2013   ESOPHAGOGASTRODUODENOSCOPY (EGD) WITH PROPOFOL N/A 03/18/2019   Procedure: ESOPHAGOGASTRODUODENOSCOPY (EGD) WITH PROPOFOL;  Surgeon: Toledo, [name], MD;  Location: [facility];  Service: Gastroenterology;  Laterality: N/A;  EYE SURGERY     Eyelid Surgery  2012   INTRAMEDULLARY (IM) NAIL INTERTROCHANTERIC Left 10/30/2015   Procedure: INTRAMEDULLARY (IM) NAIL INTERTROCHANTRIC ;  Surgeon: Kennedy Bucker, MD;  Location: [facility];  Service: Orthopedics;  Laterality: Left;   KYPHOPLASTY N/A 10/25/2018   Procedure: L4 KYPHOPLASTY;  Surgeon: Kennedy Bucker, MD;  Location: [facility];  Service: Orthopedics;  Laterality: N/A;   LAPAROSCOPIC HYSTERECTOMY  2000   total   LOWER EXTREMITY ANGIOGRAPHY Left 03/08/2017   Procedure: LOWER EXTREMITY ANGIOGRAPHY;  Surgeon: Annice Needy, MD;  Location: [facility];  Service: Cardiovascular;  Laterality: Left;   LOWER EXTREMITY ANGIOGRAPHY Left 10/30/2017   Procedure: LOWER EXTREMITY ANGIOGRAPHY;  Surgeon: Annice Needy, MD;  Location: [facility];  Service: Cardiovascular;  Laterality: Left;   LOWER EXTREMITY ANGIOGRAPHY Right 03/08/2018   Procedure: LOWER EXTREMITY ANGIOGRAPHY;  Surgeon: Annice Needy, MD;  Location: [facility];  Service: Cardiovascular;  Laterality: Right;   LOWER EXTREMITY ANGIOGRAPHY Left 10/01/2018   Procedure: LOWER EXTREMITY ANGIOGRAPHY;  Surgeon: Annice Needy, MD;  Location: [facility];  Service: Cardiovascular;  Laterality: Left;   LOWER  EXTREMITY ANGIOGRAPHY Right 10/08/2018   Procedure: LOWER EXTREMITY ANGIOGRAPHY;  Surgeon: Annice Needy, MD;  Location: [facility];  Service: Cardiovascular;  Laterality: Right;   LOWER EXTREMITY ANGIOGRAPHY Right 05/07/2020   Procedure: Lower Extremity Angiography;  Surgeon: Annice Needy, MD;  Location: [facility];  Service: Cardiovascular;  Laterality: Right;   LYSIS OF ADHESION  01/25/2021   Procedure: LYSIS OF ADHESION;  Surgeon: Campbell Lerner, MD;  Location: [facility];  Service: General;;   PERIPHERAL VASCULAR INTERVENTION  03/08/2018   Procedure: PERIPHERAL VASCULAR INTERVENTION;  Surgeon: Annice Needy, MD;  Location: [facility];  Service: Cardiovascular;;   PORTA CATH INSERTION N/A 02/17/2020   Procedure: PORTA CATH INSERTION;  Surgeon: Annice Needy, MD;  Location: [facility];  Service: Cardiovascular;  Laterality: N/A;   REDUCTION MAMMAPLASTY  1997   SACROPLASTY N/A 10/25/2018   Procedure: S1 SACROPLASTY;  Surgeon: Kennedy Bucker, MD;  Location: [facility];  Service: Orthopedics;  Laterality: N/A;   FAMILY HISTORY Family History  Problem Relation Age of Onset   Coronary artery disease Father    Heart attack Father    Coronary artery disease Mother    Heart attack Mother    Ovarian cancer Sister 67       sister had hormonal therapy for IVF txs-which increased risk factor for ovarian cancer   Breast cancer Neg Hx    SOCIAL HISTORY Social History   Tobacco Use   Smoking status: Former    Current packs/day: 0.00    Average packs/day: 1 pack/day for 20.0 years (20.0 ttl pk-yrs)    Types: Cigarettes    Start date: 03/07/1976    Quit date: 03/07/1996    Years since quitting: 26.7    Passive exposure: Past   Smokeless tobacco: Never   Tobacco comments:    started smoking at age 9 but stopped smoking in 2000  Vaping Use   Vaping status: Never Used  Substance Use Topics   Alcohol use: No    Alcohol/week: 0.0 standard drinks of alcohol   Drug  use: No       OPHTHALMIC EXAM: Base Eye Exam     Visual Acuity (Snellen - Linear)       Right Left   Dist  20/25 -2  20/20 -1   Dist ph [place] NI          Tonometry (Tonopen, 2:32 PM)       Right Left   Pressure 13 15         Pupils       Pupils Dark Light Shape React APD   Right PERRL 3 2 Round Brisk None   Left PERRL 3 2 Round Brisk None         Visual Fields       Left Right    Full Full         Extraocular Movement       Right Left    Full, Ortho Full, Ortho         Neuro/Psych     Oriented x3: Yes   Mood/Affect: Normal         Dilation     Both eyes: 2.5% Phenylephrine @ 2:32 PM           Slit Lamp and Fundus Exam     External Exam       Right Left   External Normal Normal         Slit Lamp Exam       Right Left   Lids/Lashes dermatochalasis dermatochalasis   Conjunctiva/Sclera White and quiet White and quiet   Cornea arcus; well healed cataract wound; 2-3+ diffuse Punctate epithelial erosions, decreased TBUT, mild Anterior basement membrane dystrophy superiorly arcus; well healed cataract wound, 2-3+ diffuse Punctate epithelial erosions, irregualr epi surface, decreased TBUT   Anterior Chamber Deep and quiet Deep and quiet   Iris Round and dilated Round and dilated   Lens PCIOL; open PC PCIOL; open PC   Anterior Vitreous syneresis, Posterior vitreous detachment, vitreous condensations inferiorly syneresis, Posterior vitreous detachment         Fundus Exam       Right Left   Disc Superior hyperemia -- improved, mild Pallor Pink and Sharp   C/D Ratio 0.6 0.5   Macula Flat, Blunted foveal reflex, +cystic changes -- stably improved, +Epiretinal membrane, minimal MA flat; good foveal reflex, no heme or edema, small pigment clump IT to fovea   Vessels attenuated, Tortuous attenuated, Tortuous   Periphery Attached; scattered DBH -- greastest temporal periphery- improved Attached, no heme           IMAGING AND  PROCEDURES  Imaging and Procedures for 04/25/17  OCT, Retina - OU - Both Eyes       Right Eye Quality was good. Central Foveal Thickness: 315. Progression has been stable. Findings include no IRF, no SRF, abnormal foveal contour, epiretinal membrane (Stable resolution of IRF / cystic changes temporal fovea and macula ).   Left Eye Quality was good. Central Foveal Thickness: 285. Progression has been stable. Findings include normal foveal contour, no IRF, no SRF.   Notes *Images captured and stored on drive  Diagnosis / Impression:  OD: Stable resolution of IRF / cystic changes temporal fovea and macula  OS: NFP; no IRF/SRF--stable  Clinical management:  See below  Abbreviations: NFP - Normal foveal profile. CME - cystoid macular edema. PED - pigment epithelial detachment. IRF - intraretinal fluid. SRF - subretinal fluid. EZ - ellipsoid zone. ERM - epiretinal membrane. ORA - outer retinal atrophy. ORT - outer retinal tubulation. SRHM - subretinal hyper-reflective material      Intravitreal Injection, Pharmacologic Agent - OD - Right Eye       Time Out 11/28/2022. 3:34 PM. Confirmed correct  patient, procedure, site, and patient consented.   Anesthesia Topical anesthesia was used. Anesthetic medications included Lidocaine 2%, Proparacaine 0.5%.   Procedure Preparation included 5% betadine to ocular surface, eyelid speculum. A supplied (30 g) needle was used.   Injection: 6 mg faricimab-svoa 6 MG/0.05ML   Route: Intravitreal, Site: Right Eye   NDC: [id number], Lot: [id number], Expiration date: 04/02/2024, Waste: 0 mL   Post-op Post injection exam found visual acuity of at least counting fingers. The patient tolerated the procedure well. There were no complications. The patient received written and verbal post procedure care education. Post injection medications were not given.            ASSESSMENT/PLAN:    ICD-10-CM   1. Branch retinal vein occlusion of right eye  with macular edema  H34.8310 OCT, Retina - OU - Both Eyes    Intravitreal Injection, Pharmacologic Agent - OD - Right Eye    faricimab-svoa (VABYSMO) 6mg /0.24mL intravitreal injection    2. Both eyes affected by mild nonproliferative diabetic retinopathy with macular edema, associated with type 2 diabetes mellitus (HCC)  B14.7829     3. Long term (current) use of oral hypoglycemic drugs  Z79.84     4. Current use of insulin (HCC)  Z79.4     5. Essential hypertension  I10     6. Hypertensive retinopathy of both eyes  H35.033     7. Epiretinal membrane (ERM) of right eye  H35.371     8. Pseudophakia of both eyes  Z96.1      1. BRVO w/ CME OD - by history, pt states symptoms first noticed 2 wks prior to presentation, but reports changes may have occurred prior  - initial exam with differential tortuosity of vessels (OD > OS) - FA (02.10.20) shows mild late staining / leakage in macula, staining / leakage of disc -- improving CME - differential includes DM2 (DME), hypertensive retinopathy, inflammatory etiology / uveitis - S/P IVA OD #1 (02.08.19), #2 (03.11.19), #3 (04.09.19), #4 (05.20.19), #5 (02.10.20) - gave IVA OD on 2.10.20 due to pending Eylea4U for 2020 -- resulted in increased IRF/CME - review of OCT show persistent IRF and cystic changes --  resistance to IVA  - June 2019 -- switched therapies:  ========================================================== - S/P IVE OD #1 (06.24.19), #2 (07.24.19), #3 (09.04.19), #4 (10.30.19),#5 (12.30.19), #6 (03.23.20), #7 (05.05.20), #8 (07.16.20), #9 (07.17.20), #10 (08.28.20), #11 (10.13.20), # 12 (11.17.20), #13 (2.8.21), #14 (03.09.21), #15 (04.13.21), #16 (05.11.21), #17 (06.17.21), #18 (07.23.21), #19 (08.30.21), #20 (10.04.21), #21 (11.08.21), #22 (12.08.21), #23 (01.31.22), #24 (02.28.22), #25 (04.01.22), #26 (06.15.22), #27 (07.13.22), #28 (08.17.22), #29 (09.21.22), #30 (10.19.22), #31 (11.16.22), #32 (12.16.22), #33 (01.13.23), #34  (02.10.23), #35 (03.15.23), #36 (04.14.23), #37 (05.15.23), #38 (06.12.23), #39 (07.10.23), #40 (08.07.23), #41 (09.06.23), #42 (10.04.23), #43 (11.01.23) -- IVE resistance ========================================================== **interval increase in IRF at 5 weeks on 07.01.24 exam (IVV)** - s/p IVV OD #1 (12.04.23), #2 (01.03.24), #3 (01.31.24), #4 (02.28.24), #5 (03.29.24), #6 (04.24.24), #7 (05.28.24), #8 (07.01.24), #9 (07.29.24), #10 (08.26.24), #11 (09.26.24), #12 (10.25.24) - OCT today shows Stable resolution in IRF / cystic changes temporal fovea and macula at 4.5 weeks  - BCVA OD 20/25  - recommend IVV OD #13 today, 11.25.24 w/ f/u in 5 weeks  - RBA of procedure discussed, questions answered  - see procedure note   - Vabysmo informed consent form obtained, signed and scanned on 12.04.23  - f/u 5 weeks  -- DFE/OCT/possible injection  2-4. Mild nonproliferative diabetic retinopathy, both  eyes - A1c 6.5 (7.25.24), 6.2 (03.14.24), 6.2 (09.12.23); 6.6 ( 07.01.23); 6.0 (10.14.22)  - could be contributing to CME OD  - OS with minimal diabetic retinopathy  - continue to monitor  5,6. Hypertensive retinopathy OU - stable  - as above, may have contributing to CME OD  - discussed importance of tight BP control  - monitor  7. Epiretinal membrane, right eye   - stable nasal ERM  - no indication for surgery at this time  8. Pseudophakia OU  - s/p CE/IOL OU by cataract surgeon in [facility]  - doing well  - monitor  Ophthalmic Meds Ordered this visit:  Meds ordered this encounter  Medications   faricimab-svoa (VABYSMO) 6mg /0.78mL intravitreal injection     Return in about 5 weeks (around 01/02/2023) for f/u BRVO OD, DFE, OCT.  This document serves as a record of services personally performed by Karie Chimera, MD, PhD. It was created on their behalf by Glee Arvin. Manson Passey, OA an ophthalmic technician. The creation of this record is the provider's dictation and/or activities during the  visit.    Electronically signed by: Glee Arvin. Manson Passey, OA 11/28/22 4:27 PM  Karie Chimera, M.D., Ph.D. Diseases & Surgery of the Retina and Vitreous [facility] 11/28/2022   I have reviewed the above documentation for accuracy and completeness, and I agree with the above. Karie Chimera, M.D., Ph.D. 11/28/22 4:28 PM   Abbreviations: M myopia (nearsighted); A astigmatism; H hyperopia (farsighted); P presbyopia; Mrx spectacle prescription;  CTL contact lenses; OD right eye; OS left eye; OU both eyes  XT exotropia; ET esotropia; PEK punctate epithelial keratitis; PEE punctate epithelial erosions; DES dry eye syndrome; MGD meibomian gland dysfunction; ATs artificial tears; PFAT's preservative free artificial tears; NSC nuclear sclerotic cataract; PSC posterior subcapsular cataract; ERM epi-retinal membrane; PVD posterior vitreous detachment; RD retinal detachment; DM diabetes mellitus; DR diabetic retinopathy; NPDR non-proliferative diabetic retinopathy; PDR proliferative diabetic retinopathy; CSME clinically significant macular edema; DME diabetic macular edema; dbh dot blot hemorrhages; CWS cotton wool spot; POAG primary open angle glaucoma; C/D cup-to-disc ratio; HVF humphrey visual field; GVF goldmann visual field; OCT optical coherence tomography; IOP intraocular pressure; BRVO Branch retinal vein occlusion; CRVO central retinal vein occlusion; CRAO central retinal artery occlusion; BRAO branch retinal artery occlusion; RT retinal tear; SB scleral buckle; PPV pars plana vitrectomy; VH Vitreous hemorrhage; PRP panretinal laser photocoagulation; IVK intravitreal kenalog; VMT vitreomacular traction; MH Macular hole;  NVD neovascularization of the disc; NVE neovascularization elsewhere; AREDS age related eye disease study; ARMD age related macular degeneration; POAG primary open angle glaucoma; EBMD epithelial/anterior basement membrane dystrophy; ACIOL anterior chamber intraocular  lens; IOL intraocular lens; PCIOL posterior chamber intraocular lens; Phaco/IOL phacoemulsification with intraocular lens placement; PRK photorefractive keratectomy; LASIK laser assisted in situ keratomileusis; HTN hypertension; DM diabetes mellitus; COPD chronic obstructive pulmonary disease

## 2022-11-28 ENCOUNTER — Encounter (INDEPENDENT_AMBULATORY_CARE_PROVIDER_SITE_OTHER): Payer: Self-pay | Admitting: Ophthalmology

## 2022-11-28 ENCOUNTER — Ambulatory Visit (INDEPENDENT_AMBULATORY_CARE_PROVIDER_SITE_OTHER): Payer: Medicare Other | Admitting: Ophthalmology

## 2022-11-28 DIAGNOSIS — E113213 Type 2 diabetes mellitus with mild nonproliferative diabetic retinopathy with macular edema, bilateral: Secondary | ICD-10-CM

## 2022-11-28 DIAGNOSIS — H35033 Hypertensive retinopathy, bilateral: Secondary | ICD-10-CM

## 2022-11-28 DIAGNOSIS — Z961 Presence of intraocular lens: Secondary | ICD-10-CM

## 2022-11-28 DIAGNOSIS — Z7984 Long term (current) use of oral hypoglycemic drugs: Secondary | ICD-10-CM | POA: Diagnosis not present

## 2022-11-28 DIAGNOSIS — Z794 Long term (current) use of insulin: Secondary | ICD-10-CM | POA: Diagnosis not present

## 2022-11-28 DIAGNOSIS — H35371 Puckering of macula, right eye: Secondary | ICD-10-CM

## 2022-11-28 DIAGNOSIS — I10 Essential (primary) hypertension: Secondary | ICD-10-CM

## 2022-11-28 DIAGNOSIS — H34831 Tributary (branch) retinal vein occlusion, right eye, with macular edema: Secondary | ICD-10-CM

## 2022-11-28 MED ORDER — FARICIMAB-SVOA 6 MG/0.05ML IZ SOSY
6.0000 mg | PREFILLED_SYRINGE | INTRAVITREAL | Status: AC | PRN
  Administered 2022-11-28: 6 mg via INTRAVITREAL

## 2022-12-06 ENCOUNTER — Inpatient Hospital Stay: Payer: Medicare Other | Admitting: Internal Medicine

## 2022-12-06 ENCOUNTER — Inpatient Hospital Stay: Payer: Medicare Other

## 2022-12-14 ENCOUNTER — Inpatient Hospital Stay: Payer: Medicare Other

## 2022-12-14 ENCOUNTER — Encounter: Payer: Self-pay | Admitting: Internal Medicine

## 2022-12-14 ENCOUNTER — Inpatient Hospital Stay: Payer: Medicare Other | Attending: Internal Medicine

## 2022-12-14 ENCOUNTER — Inpatient Hospital Stay (HOSPITAL_BASED_OUTPATIENT_CLINIC_OR_DEPARTMENT_OTHER): Payer: Medicare Other | Admitting: Internal Medicine

## 2022-12-14 VITALS — BP 138/50 | HR 67

## 2022-12-14 VITALS — BP 136/44 | HR 76 | Temp 97.4°F | Ht 64.0 in | Wt 175.0 lb

## 2022-12-14 DIAGNOSIS — N183 Chronic kidney disease, stage 3 unspecified: Secondary | ICD-10-CM

## 2022-12-14 DIAGNOSIS — D649 Anemia, unspecified: Secondary | ICD-10-CM

## 2022-12-14 DIAGNOSIS — Z87891 Personal history of nicotine dependence: Secondary | ICD-10-CM | POA: Diagnosis not present

## 2022-12-14 DIAGNOSIS — D509 Iron deficiency anemia, unspecified: Secondary | ICD-10-CM | POA: Diagnosis present

## 2022-12-14 LAB — CBC WITH DIFFERENTIAL (CANCER CENTER ONLY)
Abs Immature Granulocytes: 0.02 10*3/uL (ref 0.00–0.07)
Basophils Absolute: 0 10*3/uL (ref 0.0–0.1)
Basophils Relative: 1 %
Eosinophils Absolute: 0.1 10*3/uL (ref 0.0–0.5)
Eosinophils Relative: 2 %
HCT: 33.6 % — ABNORMAL LOW (ref 36.0–46.0)
Hemoglobin: 11.2 g/dL — ABNORMAL LOW (ref 12.0–15.0)
Immature Granulocytes: 0 %
Lymphocytes Relative: 25 %
Lymphs Abs: 1.5 10*3/uL (ref 0.7–4.0)
MCH: 30.7 pg (ref 26.0–34.0)
MCHC: 33.3 g/dL (ref 30.0–36.0)
MCV: 92.1 fL (ref 80.0–100.0)
Monocytes Absolute: 0.8 10*3/uL (ref 0.1–1.0)
Monocytes Relative: 14 %
Neutro Abs: 3.3 10*3/uL (ref 1.7–7.7)
Neutrophils Relative %: 58 %
Platelet Count: 319 10*3/uL (ref 150–400)
RBC: 3.65 MIL/uL — ABNORMAL LOW (ref 3.87–5.11)
RDW: 14.6 % (ref 11.5–15.5)
WBC Count: 5.8 10*3/uL (ref 4.0–10.5)
nRBC: 0 % (ref 0.0–0.2)

## 2022-12-14 LAB — FERRITIN: Ferritin: 14 ng/mL (ref 11–307)

## 2022-12-14 LAB — BASIC METABOLIC PANEL
Anion gap: 11 (ref 5–15)
BUN: 43 mg/dL — ABNORMAL HIGH (ref 8–23)
CO2: 27 mmol/L (ref 22–32)
Calcium: 10.3 mg/dL (ref 8.9–10.3)
Chloride: 99 mmol/L (ref 98–111)
Creatinine, Ser: 2.14 mg/dL — ABNORMAL HIGH (ref 0.44–1.00)
GFR, Estimated: 23 mL/min — ABNORMAL LOW (ref >60)
Glucose, Bld: 333 mg/dL — ABNORMAL HIGH (ref 70–99)
Potassium: 4.4 mmol/L (ref 3.5–5.1)
Sodium: 137 mmol/L (ref 135–145)

## 2022-12-14 LAB — IRON AND TIBC
Iron: 117 ug/dL (ref 28–170)
Saturation Ratios: 30 % (ref 10.4–31.8)
TIBC: 386 ug/dL (ref 250–450)
UIBC: 269 ug/dL

## 2022-12-14 LAB — SAMPLE TO BLOOD BANK

## 2022-12-14 MED ORDER — IRON SUCROSE 20 MG/ML IV SOLN
200.0000 mg | Freq: Once | INTRAVENOUS | Status: AC
  Administered 2022-12-14: 200 mg via INTRAVENOUS
  Filled 2022-12-14: qty 10

## 2022-12-14 MED ORDER — HEPARIN SOD (PORK) LOCK FLUSH 100 UNIT/ML IV SOLN
500.0000 [IU] | Freq: Once | INTRAVENOUS | Status: AC | PRN
  Administered 2022-12-14: 500 [IU]
  Filled 2022-12-14: qty 5

## 2022-12-14 MED ORDER — SODIUM CHLORIDE 0.9% FLUSH
10.0000 mL | Freq: Once | INTRAVENOUS | Status: AC | PRN
  Administered 2022-12-14: 10 mL
  Filled 2022-12-14: qty 10

## 2022-12-14 MED ORDER — SODIUM CHLORIDE 0.9 % IV SOLN
200.0000 mg | Freq: Once | INTRAVENOUS | Status: DC
  Filled 2022-12-14: qty 10

## 2022-12-14 NOTE — Progress Notes (Signed)
Fatigue/weakness: no Dyspena: no Light headedness: no Blood in stool:  no 

## 2022-12-14 NOTE — Assessment & Plan Note (Addendum)
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  If Retacrit/iron does not improve consider bone marrow biopsy. On gentle iron [iron biglycinate; 28 mg ] 1 pill a day.    # AUG  2024- Ferritin-15 ; I sat- 36%-  hemoglobin 11.2  HOLD retacrit; Proceed with venofer.   # Etiology-CKD-III--IV; GFR ~23 overall worsened [Dr.Kolluru];- stable.   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  #Poor IV access/Mediport placement- stable.   Schedule retacrit/venofer on separate days  # DISPOSITION:  # Venofer today; NO retacrit.   # in 2 months- port/ labs- H&H- possible retacrit   # follow up 4 months  MD; port; labs- cbc/bmp; iron studies;ferritin; possible retacrit OR venofer- Dr.B

## 2022-12-14 NOTE — Progress Notes (Signed)
[facility] CONSULT NOTE  Patient Care Team: Danella Penton, MD as PCP - General (Internal Medicine) Elnita Maxwell, MD as Referring Physician (Gastroenterology) Earna Coder, MD as Consulting Physician (Hematology and Oncology)  CHIEF COMPLAINTS/PURPOSE OF CONSULTATION: Anemia  HEMATOLOGY HISTORY  # ANEMIA- Jan 2021- 8.8/ferritin 11 [PCP]; N-WBC/platelets? IDA vs other- EGD-2015/colonoscopy-? 2015; 2020- [Dr.Skulskie] ; capsule-2016- ? 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   HISTORY OF PRESENTING ILLNESS: Ambulating independently.  Alone.   Cheryl Leonard 77 y.o.  female anemia iron deficiency-question CKD-III  here for follow-up.   C/o of ongoing Fatigue. No visible blood in stool. Dyspnea with exertion. No falls. Uses cane.   Review of Systems  Constitutional:  Positive for malaise/fatigue. Negative for chills, diaphoresis and fever.  HENT:  Negative for nosebleeds and sore throat.   Eyes:  Negative for double vision.  Respiratory:  Positive for shortness of breath. Negative for cough, hemoptysis, sputum production and wheezing.   Cardiovascular:  Negative for chest pain, palpitations, orthopnea and leg swelling.  Gastrointestinal:  Negative for abdominal pain, blood in stool, constipation, diarrhea, heartburn, melena, nausea and vomiting.  Genitourinary:  Negative for dysuria, frequency and urgency.  Musculoskeletal:  Positive for joint pain.  Skin: Negative.  Negative for itching and rash.  Neurological:  Negative for dizziness, tingling, focal weakness, weakness and headaches.  Endo/Heme/Allergies:  Does not bruise/bleed easily.  Psychiatric/Behavioral:  Negative for depression. The patient is not nervous/anxious and does not have insomnia.     MEDICAL HISTORY:   Past Medical History:  Diagnosis Date   Anemia    Anxiety    Arthritis    Gout   Cataracts, both eyes    Diabetic retinopathy (HCC)    NPDR OU   Diverticulitis    GERD (gastroesophageal reflux disease)    Gout    Headache    h/o migraines   History of fracture of patella    right knee   History of positive PPD    Patient always shows positive   Hyperlipidemia    Hypertension    Hypertensive retinopathy    OU   Hypothyroidism    Lichen sclerosus 12/30/2013   of vulva   Metatarsal fracture    Neuropathy    Osteopenia    Peripheral vascular disease (HCC)    Polyneuropathy    numbness and tingling in feet and toes   Renal insufficiency    Stage 3   Sleep apnea    does not use cpap-lost weight    Type 2 diabetes mellitus, uncontrolled     SURGICAL HISTORY: Past Surgical History:  Procedure Laterality Date   ABDOMINAL HYSTERECTOMY     AMPUTATION TOE Right 05/08/2020   Procedure: AMPUTATION TOE-Right 4th Toe;  Surgeon: Rosetta Posner, DPM;  Location: [facility];  Service: Podiatry;  Laterality: Right;   AMPUTATION TOE Left 04/22/2021   Procedure: AMPUTATION TOE - 4TH METARSOPHANGEAL JOINT;  Surgeon: Rosetta Posner, DPM;  Location: [facility];  Service: Podiatry;  Laterality: Left;   APPENDECTOMY     BREAST REDUCTION SURGERY  2001   CATARACT EXTRACTION     CESAREAN SECTION  1976   COLONOSCOPY  03/05/2013   Nml - due for repeat 03/06/2018   COLONOSCOPY WITH PROPOFOL N/A 03/18/2019   Procedure: COLONOSCOPY WITH PROPOFOL;  Surgeon:  Toledo, [name], MD;  Location: [facility];  Service: Gastroenterology;  Laterality: N/A;   DIAGNOSTIC LAPAROSCOPY     DILATION AND CURETTAGE OF UTERUS  1989   ENDARTERECTOMY FEMORAL Bilateral 03/09/2018   Procedure: ENDARTERECTOMY FEMORAL;  Surgeon: Annice Needy, MD;  Location: [facility];  Service: Vascular;  Laterality: Bilateral;   ENDARTERECTOMY POPLITEAL Left 03/09/2018   Procedure: ENDARTERECTOMY POPLITEAL AND SFA;  Surgeon: Annice Needy, MD;   Location: [facility];  Service: Vascular;  Laterality: Left;   ESOPHAGOGASTRODUODENOSCOPY  03/05/2013   ESOPHAGOGASTRODUODENOSCOPY (EGD) WITH PROPOFOL N/A 03/18/2019   Procedure: ESOPHAGOGASTRODUODENOSCOPY (EGD) WITH PROPOFOL;  Surgeon: Toledo, [name], MD;  Location: [facility];  Service: Gastroenterology;  Laterality: N/A;   EYE SURGERY     Eyelid Surgery  2012   INTRAMEDULLARY (IM) NAIL INTERTROCHANTERIC Left 10/30/2015   Procedure: INTRAMEDULLARY (IM) NAIL INTERTROCHANTRIC ;  Surgeon: Kennedy Bucker, MD;  Location: [facility];  Service: Orthopedics;  Laterality: Left;   KYPHOPLASTY N/A 10/25/2018   Procedure: L4 KYPHOPLASTY;  Surgeon: Kennedy Bucker, MD;  Location: [facility];  Service: Orthopedics;  Laterality: N/A;   LAPAROSCOPIC HYSTERECTOMY  2000   total   LOWER EXTREMITY ANGIOGRAPHY Left 03/08/2017   Procedure: LOWER EXTREMITY ANGIOGRAPHY;  Surgeon: Annice Needy, MD;  Location: [facility];  Service: Cardiovascular;  Laterality: Left;   LOWER EXTREMITY ANGIOGRAPHY Left 10/30/2017   Procedure: LOWER EXTREMITY ANGIOGRAPHY;  Surgeon: Annice Needy, MD;  Location: [facility];  Service: Cardiovascular;  Laterality: Left;   LOWER EXTREMITY ANGIOGRAPHY Right 03/08/2018   Procedure: LOWER EXTREMITY ANGIOGRAPHY;  Surgeon: Annice Needy, MD;  Location: [facility];  Service: Cardiovascular;  Laterality: Right;   LOWER EXTREMITY ANGIOGRAPHY Left 10/01/2018   Procedure: LOWER EXTREMITY ANGIOGRAPHY;  Surgeon: Annice Needy, MD;  Location: [facility];  Service: Cardiovascular;  Laterality: Left;   LOWER EXTREMITY ANGIOGRAPHY Right 10/08/2018   Procedure: LOWER EXTREMITY ANGIOGRAPHY;  Surgeon: Annice Needy, MD;  Location: [facility];  Service: Cardiovascular;  Laterality: Right;   LOWER EXTREMITY ANGIOGRAPHY Right 05/07/2020   Procedure: Lower Extremity Angiography;  Surgeon: Annice Needy, MD;  Location: [facility];  Service: Cardiovascular;  Laterality:  Right;   LYSIS OF ADHESION  01/25/2021   Procedure: LYSIS OF ADHESION;  Surgeon: Campbell Lerner, MD;  Location: [facility];  Service: General;;   PERIPHERAL VASCULAR INTERVENTION  03/08/2018   Procedure: PERIPHERAL VASCULAR INTERVENTION;  Surgeon: Annice Needy, MD;  Location: [facility];  Service: Cardiovascular;;   PORTA CATH INSERTION N/A 02/17/2020   Procedure: PORTA CATH INSERTION;  Surgeon: Annice Needy, MD;  Location: [facility];  Service: Cardiovascular;  Laterality: N/A;   REDUCTION MAMMAPLASTY  1997   SACROPLASTY N/A 10/25/2018   Procedure: S1 SACROPLASTY;  Surgeon: Kennedy Bucker, MD;  Location: [facility];  Service: Orthopedics;  Laterality: N/A;    SOCIAL HISTORY: Social History   Socioeconomic History   Marital status: Married    Spouse name: [name]   Number of children: 3   Years of education: Not on file   Highest education level: Not on file  Occupational History   Occupation: Barrister's clerk  Tobacco Use   Smoking status: Former    Current packs/day: 0.00    Average packs/day: 1 pack/day for 20.0 years (20.0 ttl pk-yrs)    Types: Cigarettes    Start date: 03/07/1976    Quit date: 03/07/1996  Years since quitting: 26.7    Passive exposure: Past   Smokeless tobacco: Never   Tobacco comments:    started smoking at age 48 but stopped smoking in 2000  Vaping Use   Vaping status: Never Used  Substance and Sexual Activity   Alcohol use: No    Alcohol/week: 0.0 standard drinks of alcohol   Drug use: No   Sexual activity: Yes    Partners: Male    Birth control/protection: Surgical  Other Topics Concern   Not on file  Social History Narrative   Lives in [place]; with husband; quit > 20 years; no alcohol; used to work at [employer]  at [employer].    Social Determinants of Health   Financial Resource Strain: Low Risk  (10/08/2018)   Overall Financial Resource Strain (CARDIA)    Difficulty of Paying Living Expenses: Not very hard  Food  Insecurity: No Food Insecurity (10/08/2018)   Hunger Vital Sign    Worried About Running Out of Food in the Last Year: Never true    Ran Out of Food in the Last Year: Never true  Transportation Needs: Unknown (10/01/2018)   PRAPARE - Administrator, Civil Service (Medical): No    Lack of Transportation (Non-Medical): Not on file  Physical Activity: Unknown (10/01/2018)   Exercise Vital Sign    Days of Exercise per Week: 2 days    Minutes of Exercise per Session: Not on file  Stress: Stress Concern Present (10/01/2018)   [employer] of Occupational Health - Occupational Stress Questionnaire    Feeling of Stress : To some extent  Social Connections: Unknown (10/08/2018)   Social Connection and Isolation Panel [NHANES]    Frequency of Communication with Friends and Family: More than three times a week    Frequency of Social Gatherings with Friends and Family: Not on file    Attends Religious Services: Not on file    Active Member of Clubs or Organizations: Not on file    Attends Banker Meetings: Not on file    Marital Status: Married  Intimate Partner Violence: Not At Risk (10/08/2018)   Humiliation, Afraid, Rape, and Kick questionnaire    Fear of Current or Ex-Partner: No    Emotionally Abused: No    Physically Abused: No    Sexually Abused: No    FAMILY HISTORY: Family History  Problem Relation Age of Onset   Coronary artery disease Father    Heart attack Father    Coronary artery disease Mother    Heart attack Mother    Ovarian cancer Sister 62       sister had hormonal therapy for IVF txs-which increased risk factor for ovarian cancer   Breast cancer Neg Hx     ALLERGIES:  has No Known Allergies.  MEDICATIONS:  Current Outpatient Medications  Medication Sig Dispense Refill   ALPRAZolam (XANAX) 0.25 MG tablet Take 0.25 mg by mouth daily as needed for anxiety.     aspirin EC 81 MG tablet Take 1 tablet (81 mg total) by mouth daily. 30 tablet 11    cholecalciferol (VITAMIN D) 1000 units tablet Take 1,000 Units by mouth 2 (two) times daily.     dapagliflozin propanediol (FARXIGA) 5 MG TABS tablet Take 5 mg by mouth daily.     denosumab (PROLIA) 60 MG/ML SOLN injection Inject 60 mg into the skin every 6 (six) months.      escitalopram (LEXAPRO) 10 MG tablet Take 10 mg by mouth daily.  estradiol (ESTRACE) 0.1 MG/GM vaginal cream Place 1 Applicatorful vaginally daily as needed (vaginal irritation).     famotidine (PEPCID) 40 MG tablet Take 40 mg by mouth daily.     furosemide (LASIX) 20 MG tablet Take 20 mg by mouth every Wednesday.     gabapentin (NEURONTIN) 300 MG capsule Take 300 mg by mouth at bedtime as needed (pain).     HYDROcodone-acetaminophen (NORCO/VICODIN) 5-325 MG tablet Take 1 tablet by mouth daily.     levothyroxine (SYNTHROID, LEVOTHROID) 100 MCG tablet Take 100 mcg by mouth daily before breakfast.   3   lidocaine-prilocaine (EMLA) cream Apply 1 application topically as needed (apply prior to port a cath access). 30 g 3   Magnesium 500 MG TABS Take 500 mg by mouth every morning.      metoprolol succinate (TOPROL-XL) 50 MG 24 hr tablet Take 50 mg by mouth daily. Take with or immediately following a meal.     mirtazapine (REMERON) 15 MG tablet Take 15 mg by mouth as needed.     Multiple Vitamin (MULTIVITAMIN WITH MINERALS) TABS tablet Take 1 tablet by mouth daily. Centrum Silver     NOVOLOG FLEXPEN 100 UNIT/ML FlexPen SMARTSIG:8 Unit(s) SUB-Q Morning-Evening     nystatin cream (MYCOSTATIN) Apply 1 application topically daily as needed (Yeast infection).     olmesartan (BENICAR) 20 MG tablet Take 20 mg by mouth daily.     pantoprazole (PROTONIX) 40 MG tablet Take 40 mg by mouth daily.     rivaroxaban (XARELTO) 20 MG TABS tablet Take 1 tablet (20 mg total) by mouth daily with supper. 90 tablet 3   rosuvastatin (CRESTOR) 20 MG tablet Take 20 mg by mouth every morning.     sodium bicarbonate 650 MG tablet Take 650 mg by mouth 2  (two) times daily.     TRESIBA FLEXTOUCH 200 UNIT/ML SOPN Inject 25-30 Units as directed at bedtime. Titrate according to fasting blood glucose not to exceed 50 units a day  5   vitamin B-12 (CYANOCOBALAMIN) 1000 MCG tablet Take 1,000 mcg by mouth daily.     vitamin C (ASCORBIC ACID) 250 MG tablet Take 250 mg by mouth daily.     vitamin E 400 UNIT capsule Take 400 Units by mouth daily.     Zinc Sulfate (ZINC 15 PO) Take 1 tablet by mouth daily.     zolpidem (AMBIEN) 10 MG tablet Take 10 mg by mouth at bedtime.     No current facility-administered medications for this visit.      PHYSICAL EXAMINATION:   Vitals:   12/14/22 1517  BP: (!) 136/44  Pulse: 76  Temp: (!) 97.4 F (36.3 C)  SpO2: 95%    Filed Weights   12/14/22 1517  Weight: 175 lb (79.4 kg)     Physical Exam Constitutional:      Comments: Alone.  Ambulating independently.  HENT:     Head: Normocephalic and atraumatic.     Mouth/Throat:     Pharynx: No oropharyngeal exudate.  Eyes:     Pupils: Pupils are equal, round, and reactive to light.  Cardiovascular:     Rate and Rhythm: Normal rate and regular rhythm.  Pulmonary:     Effort: Pulmonary effort is normal. No respiratory distress.     Breath sounds: Normal breath sounds. No wheezing.  Abdominal:     General: Bowel sounds are normal. There is no distension.     Palpations: Abdomen is soft. There is no mass.  Tenderness: There is no abdominal tenderness. There is no guarding or rebound.  Musculoskeletal:        General: No tenderness. Normal range of motion.     Cervical back: Normal range of motion and neck supple.  Skin:    General: Skin is warm.  Neurological:     Mental Status: She is alert and oriented to person, place, and time.  Psychiatric:        Mood and Affect: Affect normal.     LABORATORY DATA:  I have reviewed the data as listed Lab Results  Component Value Date   WBC 5.8 12/14/2022   HGB 11.2 (L) 12/14/2022   HCT 33.6 (L)  12/14/2022   MCV 92.1 12/14/2022   PLT 319 12/14/2022   Recent Labs    05/25/22 1413 08/17/22 1414 12/14/22 1505  NA 139 136 137  K 4.1 4.5 4.4  CL 101 107 99  CO2 27 21* 27  GLUCOSE 215* 269* 333*  BUN 29* 24* 43*  CREATININE 1.79* 1.48* 2.14*  CALCIUM 9.6 8.0* 10.3  GFRNONAA 29* 36* 23*     Intravitreal Injection, Pharmacologic Agent - OD - Right Eye  Result Date: 11/28/2022 Time Out 11/28/2022. 3:34 PM. Confirmed correct patient, procedure, site, and patient consented. Anesthesia Topical anesthesia was used. Anesthetic medications included Lidocaine 2%, Proparacaine 0.5%. Procedure Preparation included 5% betadine to ocular surface, eyelid speculum. A supplied (30 g) needle was used. Injection: 6 mg faricimab-svoa 6 MG/0.05ML   Route: Intravitreal, Site: Right Eye   NDC: R2083049, Lot: [id number], Expiration date: 04/02/2024, Waste: 0 mL Post-op Post injection exam found visual acuity of at least counting fingers. The patient tolerated the procedure well. There were no complications. The patient received written and verbal post procedure care education. Post injection medications were not given.   OCT, Retina - OU - Both Eyes  Result Date: 11/28/2022 Right Eye Quality was good. Central Foveal Thickness: 315. Progression has been stable. Findings include no IRF, no SRF, abnormal foveal contour, epiretinal membrane (Stable resolution of IRF / cystic changes temporal fovea and macula ). Left Eye Quality was good. Central Foveal Thickness: 285. Progression has been stable. Findings include normal foveal contour, no IRF, no SRF. Notes *Images captured and stored on drive Diagnosis / Impression: OD: Stable resolution of IRF / cystic changes temporal fovea and macula OS: NFP; no IRF/SRF--stable Clinical management: See below Abbreviations: NFP - Normal foveal profile. CME - cystoid macular edema. PED - pigment epithelial detachment. IRF - intraretinal fluid. SRF - subretinal fluid. EZ -  ellipsoid zone. ERM - epiretinal membrane. ORA - outer retinal atrophy. ORT - outer retinal tubulation. 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  If Retacrit/iron does not improve consider bone marrow biopsy. On gentle iron [iron biglycinate; 28 mg ] 1 pill a day.    # AUG  2024- Ferritin-15 ; I sat- 36%-  hemoglobin 11.2  HOLD retacrit; Proceed with venofer.   # Etiology-CKD-III--IV; GFR ~23 overall worsened [Dr.Kolluru];- stable.   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  #Poor IV access/Mediport placement- stable.   Schedule retacrit/venofer on separate days  # DISPOSITION:  # Venofer today; NO retacrit.   # in 2 months- port/ labs- H&H- possible retacrit   # follow up 4 months  MD; port; labs- cbc/bmp; iron studies;ferritin; possible retacrit OR venofer- Dr.B  All questions were answered. The patient knows to call the clinic with any problems, questions or concerns.    Earna Coder, MD 12/14/2022 4:29 PM

## 2022-12-14 NOTE — Patient Instructions (Signed)
Iron Sucrose Injection What is this medication? IRON SUCROSE (EYE ern SOO krose) treats low levels of iron (iron deficiency anemia) in people with kidney disease. Iron is a mineral that plays an important role in making red blood cells, which carry oxygen from your lungs to the rest of your body. This medicine may be used for other purposes; ask your health care provider or pharmacist if you have questions. COMMON BRAND NAME(S): Venofer What should I tell my care team before I take this medication? They need to know if you have any of these conditions: Anemia not caused by low iron levels Heart disease High levels of iron in the blood Kidney disease Liver disease An unusual or allergic reaction to iron, other medications, foods, dyes, or preservatives Pregnant or trying to get pregnant Breastfeeding How should I use this medication? This medication is for infusion into a vein. It is given in a hospital or clinic setting. Talk to your care team about the use of this medication in children. While this medication may be prescribed for children as young as 2 years for selected conditions, precautions do apply. Overdosage: If you think you have taken too much of this medicine contact a poison control center or emergency room at once. NOTE: This medicine is only for you. Do not share this medicine with others. What if I miss a dose? Keep appointments for follow-up doses. It is important not to miss your dose. Call your care team if you are unable to keep an appointment. What may interact with this medication? Do not take this medication with any of the following: Deferoxamine Dimercaprol Other iron products This medication may also interact with the following: Chloramphenicol Deferasirox This list may not describe all possible interactions. Give your health care provider a list of all the medicines, herbs, non-prescription drugs, or dietary supplements you use. Also tell them if you smoke,  drink alcohol, or use illegal drugs. Some items may interact with your medicine. What should I watch for while using this medication? Visit your care team regularly. Tell your care team if your symptoms do not start to get better or if they get worse. You may need blood work done while you are taking this medication. You may need to follow a special diet. Talk to your care team. Foods that contain iron include: whole grains/cereals, dried fruits, beans, or peas, leafy green vegetables, and organ meats (liver, kidney). What side effects may I notice from receiving this medication? Side effects that you should report to your care team as soon as possible: Allergic reactions--skin rash, itching, hives, swelling of the face, lips, tongue, or throat Low blood pressure--dizziness, feeling faint or lightheaded, blurry vision Shortness of breath Side effects that usually do not require medical attention (report to your care team if they continue or are bothersome): Flushing Headache Joint pain Muscle pain Nausea Pain, redness, or irritation at injection site This list may not describe all possible side effects. Call your doctor for medical advice about side effects. You may report side effects to FDA at 1-800-FDA-1088. Where should I keep my medication? This medication is given in a hospital or clinic. It will not be stored at home. NOTE: This sheet is a summary. It may not cover all possible information. If you have questions about this medicine, talk to your doctor, pharmacist, or health care provider.  2024 Elsevier/Gold Standard (2022-05-27 00:00:00)

## 2023-01-03 ENCOUNTER — Encounter (INDEPENDENT_AMBULATORY_CARE_PROVIDER_SITE_OTHER): Payer: Medicare Other | Admitting: Ophthalmology

## 2023-01-03 DIAGNOSIS — H34831 Tributary (branch) retinal vein occlusion, right eye, with macular edema: Secondary | ICD-10-CM

## 2023-01-03 DIAGNOSIS — I10 Essential (primary) hypertension: Secondary | ICD-10-CM

## 2023-01-03 DIAGNOSIS — E113213 Type 2 diabetes mellitus with mild nonproliferative diabetic retinopathy with macular edema, bilateral: Secondary | ICD-10-CM

## 2023-01-03 DIAGNOSIS — Z961 Presence of intraocular lens: Secondary | ICD-10-CM

## 2023-01-03 DIAGNOSIS — H35371 Puckering of macula, right eye: Secondary | ICD-10-CM

## 2023-01-03 DIAGNOSIS — H35033 Hypertensive retinopathy, bilateral: Secondary | ICD-10-CM

## 2023-01-03 DIAGNOSIS — Z794 Long term (current) use of insulin: Secondary | ICD-10-CM

## 2023-01-03 DIAGNOSIS — Z7984 Long term (current) use of oral hypoglycemic drugs: Secondary | ICD-10-CM

## 2023-01-10 ENCOUNTER — Encounter: Payer: Self-pay | Admitting: Internal Medicine

## 2023-01-10 ENCOUNTER — Encounter (INDEPENDENT_AMBULATORY_CARE_PROVIDER_SITE_OTHER): Payer: Self-pay | Admitting: Ophthalmology

## 2023-01-10 ENCOUNTER — Ambulatory Visit (INDEPENDENT_AMBULATORY_CARE_PROVIDER_SITE_OTHER): Payer: Medicare Other | Admitting: Ophthalmology

## 2023-01-10 DIAGNOSIS — Z794 Long term (current) use of insulin: Secondary | ICD-10-CM | POA: Diagnosis not present

## 2023-01-10 DIAGNOSIS — I10 Essential (primary) hypertension: Secondary | ICD-10-CM

## 2023-01-10 DIAGNOSIS — H35033 Hypertensive retinopathy, bilateral: Secondary | ICD-10-CM

## 2023-01-10 DIAGNOSIS — E113213 Type 2 diabetes mellitus with mild nonproliferative diabetic retinopathy with macular edema, bilateral: Secondary | ICD-10-CM

## 2023-01-10 DIAGNOSIS — Z7984 Long term (current) use of oral hypoglycemic drugs: Secondary | ICD-10-CM

## 2023-01-10 DIAGNOSIS — H35371 Puckering of macula, right eye: Secondary | ICD-10-CM

## 2023-01-10 DIAGNOSIS — H34831 Tributary (branch) retinal vein occlusion, right eye, with macular edema: Secondary | ICD-10-CM

## 2023-01-10 DIAGNOSIS — Z961 Presence of intraocular lens: Secondary | ICD-10-CM

## 2023-01-10 MED ORDER — FARICIMAB-SVOA 6 MG/0.05ML IZ SOSY
6.0000 mg | PREFILLED_SYRINGE | INTRAVITREAL | Status: AC | PRN
  Administered 2023-01-10: 6 mg via INTRAVITREAL

## 2023-01-10 NOTE — Progress Notes (Signed)
 [facility] - Clinic Note  01/10/2023     CHIEF COMPLAINT Patient presents for Retina Follow Up   HISTORY OF PRESENT ILLNESS: Cheryl Leonard is a 78 y.o. female who presents to the clinic today for:    HPI     Retina Follow Up   Patient presents with  CRVO/BRVO.  In right eye.  This started 5 weeks ago.  Duration of 5 weeks.  Since onset it is stable.  I, the attending physician,  performed the HPI with the patient and updated documentation appropriately.        Comments   5 week retina follow up BRVO OD and IVV OD pt denies any vision changes noticed has not noticed any flashes or floaters her last reading was 189 this am       Last edited by Valdemar Rogue, MD on 01/10/2023  4:05 PM.     Referring physician: Cleotilde Oneil FALCON, MD [phone] [facility] [address] [facility] [place],  [place] [address]   HISTORICAL INFORMATION:   Selected notes from the MEDICAL RECORD NUMBER Referred by Dr. Estelle for concern of DME OD Lab Results  Component Value Date   HGBA1C 7.3 (H) 01/11/2021     CURRENT MEDICATIONS: No current outpatient medications on file. (Ophthalmic Drugs)   No current facility-administered medications for this visit. (Ophthalmic Drugs)   Current Outpatient Medications (Other)  Medication Sig   ALPRAZolam  (XANAX ) 0.25 MG tablet Take 0.25 mg by mouth daily as needed for anxiety.   aspirin  EC 81 MG tablet Take 1 tablet (81 mg total) by mouth daily.   cholecalciferol  (VITAMIN D ) 1000 units tablet Take 1,000 Units by mouth 2 (two) times daily.   dapagliflozin propanediol (FARXIGA) 5 MG TABS tablet Take 5 mg by mouth daily.   denosumab  (PROLIA ) 60 MG/ML SOLN injection Inject 60 mg into the skin every 6 (six) months.    escitalopram  (LEXAPRO ) 10 MG tablet Take 10 mg by mouth daily.   estradiol  (ESTRACE ) 0.1 MG/GM vaginal cream Place 1 Applicatorful vaginally daily as needed (vaginal irritation).   famotidine  (PEPCID ) 40 MG  tablet Take 40 mg by mouth daily.   furosemide  (LASIX ) 20 MG tablet Take 20 mg by mouth every Wednesday.   gabapentin  (NEURONTIN ) 300 MG capsule Take 300 mg by mouth at bedtime as needed (pain).   HYDROcodone -acetaminophen  (NORCO/VICODIN) 5-325 MG tablet Take 1 tablet by mouth daily.   levothyroxine  (SYNTHROID , LEVOTHROID) 100 MCG tablet Take 100 mcg by mouth daily before breakfast.    lidocaine -prilocaine  (EMLA ) cream Apply 1 application topically as needed (apply prior to port a cath access).   Magnesium  500 MG TABS Take 500 mg by mouth every morning.    metoprolol  succinate (TOPROL -XL) 50 MG 24 hr tablet Take 50 mg by mouth daily. Take with or immediately following a meal.   mirtazapine  (REMERON ) 15 MG tablet Take 15 mg by mouth as needed.   Multiple Vitamin (MULTIVITAMIN WITH MINERALS) TABS tablet Take 1 tablet by mouth daily. Centrum Silver   NOVOLOG  FLEXPEN 100 UNIT/ML FlexPen SMARTSIG:8 Unit(s) SUB-Q Morning-Evening   nystatin  cream (MYCOSTATIN ) Apply 1 application topically daily as needed (Yeast infection).   olmesartan (BENICAR) 20 MG tablet Take 20 mg by mouth daily.   pantoprazole  (PROTONIX ) 40 MG tablet Take 40 mg by mouth daily.   rivaroxaban  (XARELTO ) 20 MG TABS tablet Take 1 tablet (20 mg total) by mouth daily with supper.   rosuvastatin  (CRESTOR ) 20 MG tablet Take 20  mg by mouth every morning.   sodium bicarbonate  650 MG tablet Take 650 mg by mouth 2 (two) times daily.   TRESIBA FLEXTOUCH 200 UNIT/ML SOPN Inject 25-30 Units as directed at bedtime. Titrate according to fasting blood glucose not to exceed 50 units a day   vitamin B-12 (CYANOCOBALAMIN ) 1000 MCG tablet Take 1,000 mcg by mouth daily.   vitamin C  (ASCORBIC ACID ) 250 MG tablet Take 250 mg by mouth daily.   vitamin E  400 UNIT capsule Take 400 Units by mouth daily.   Zinc Sulfate (ZINC 15 PO) Take 1 tablet by mouth daily.   zolpidem  (AMBIEN ) 10 MG tablet Take 10 mg by mouth at bedtime.   No current  facility-administered medications for this visit. (Other)   REVIEW OF SYSTEMS: ROS   Positive for: Gastrointestinal, Musculoskeletal, Endocrine, Cardiovascular, Eyes, Respiratory Negative for: Constitutional, Neurological, Skin, Genitourinary, HENT, Psychiatric, Allergic/Imm, Heme/Lymph Last edited by Resa Delon ORN, COT on 01/10/2023  8:33 AM.      ALLERGIES No Known Allergies  PAST MEDICAL HISTORY Past Medical History:  Diagnosis Date   Anemia    Anxiety    Arthritis    Gout   Cataracts, both eyes    Diabetic retinopathy (HCC)    NPDR OU   Diverticulitis    GERD (gastroesophageal reflux disease)    Gout    Headache    h/o migraines   History of fracture of patella    right knee   History of positive PPD    Patient always shows positive   Hyperlipidemia    Hypertension    Hypertensive retinopathy    OU   Hypothyroidism    Lichen sclerosus 12/30/2013   of vulva   Metatarsal fracture    Neuropathy    Osteopenia    Peripheral vascular disease (HCC)    Polyneuropathy    numbness and tingling in feet and toes   Renal insufficiency    Stage 3   Sleep apnea    does not use cpap-lost weight    Type 2 diabetes mellitus, uncontrolled    Past Surgical History:  Procedure Laterality Date   ABDOMINAL HYSTERECTOMY     AMPUTATION TOE Right 05/08/2020   Procedure: AMPUTATION TOE-Right 4th Toe;  Surgeon: Lennie Barter, DPM;  Location: [facility];  Service: Podiatry;  Laterality: Right;   AMPUTATION TOE Left 04/22/2021   Procedure: AMPUTATION TOE - 4TH METARSOPHANGEAL JOINT;  Surgeon: Lennie Barter, DPM;  Location: [facility];  Service: Podiatry;  Laterality: Left;   APPENDECTOMY     BREAST REDUCTION SURGERY  2001   CATARACT EXTRACTION     CESAREAN SECTION  1976   COLONOSCOPY  03/05/2013   Nml - due for repeat 03/06/2018   COLONOSCOPY WITH PROPOFOL  N/A 03/18/2019   Procedure: COLONOSCOPY WITH PROPOFOL ;  Surgeon: Toledo, Ladell POUR, MD;  Location: [facility];  Service:  Gastroenterology;  Laterality: N/A;   DIAGNOSTIC LAPAROSCOPY     DILATION AND CURETTAGE OF UTERUS  1989   ENDARTERECTOMY FEMORAL Bilateral 03/09/2018   Procedure: ENDARTERECTOMY FEMORAL;  Surgeon: Marea Selinda RAMAN, MD;  Location: [facility];  Service: Vascular;  Laterality: Bilateral;   ENDARTERECTOMY POPLITEAL Left 03/09/2018   Procedure: ENDARTERECTOMY POPLITEAL AND SFA;  Surgeon: Marea Selinda RAMAN, MD;  Location: [facility];  Service: Vascular;  Laterality: Left;   ESOPHAGOGASTRODUODENOSCOPY  03/05/2013   ESOPHAGOGASTRODUODENOSCOPY (EGD) WITH PROPOFOL  N/A 03/18/2019   Procedure: ESOPHAGOGASTRODUODENOSCOPY (EGD) WITH PROPOFOL ;  Surgeon: Toledo, Ladell POUR, MD;  Location: [facility];  Service: Gastroenterology;  Laterality: N/A;   EYE SURGERY     Eyelid Surgery  2012   INTRAMEDULLARY (IM) NAIL INTERTROCHANTERIC Left 10/30/2015   Procedure: INTRAMEDULLARY (IM) NAIL INTERTROCHANTRIC ;  Surgeon: Ozell Flake, MD;  Location: [facility];  Service: Orthopedics;  Laterality: Left;   KYPHOPLASTY N/A 10/25/2018   Procedure: L4 KYPHOPLASTY;  Surgeon: Flake Ozell, MD;  Location: [facility];  Service: Orthopedics;  Laterality: N/A;   LAPAROSCOPIC HYSTERECTOMY  2000   total   LOWER EXTREMITY ANGIOGRAPHY Left 03/08/2017   Procedure: LOWER EXTREMITY ANGIOGRAPHY;  Surgeon: Marea Selinda RAMAN, MD;  Location: [facility];  Service: Cardiovascular;  Laterality: Left;   LOWER EXTREMITY ANGIOGRAPHY Left 10/30/2017   Procedure: LOWER EXTREMITY ANGIOGRAPHY;  Surgeon: Marea Selinda RAMAN, MD;  Location: [facility];  Service: Cardiovascular;  Laterality: Left;   LOWER EXTREMITY ANGIOGRAPHY Right 03/08/2018   Procedure: LOWER EXTREMITY ANGIOGRAPHY;  Surgeon: Marea Selinda RAMAN, MD;  Location: [facility];  Service: Cardiovascular;  Laterality: Right;   LOWER EXTREMITY ANGIOGRAPHY Left 10/01/2018   Procedure: LOWER EXTREMITY ANGIOGRAPHY;  Surgeon: Marea Selinda RAMAN, MD;  Location: [facility];  Service: Cardiovascular;   Laterality: Left;   LOWER EXTREMITY ANGIOGRAPHY Right 10/08/2018   Procedure: LOWER EXTREMITY ANGIOGRAPHY;  Surgeon: Marea Selinda RAMAN, MD;  Location: [facility];  Service: Cardiovascular;  Laterality: Right;   LOWER EXTREMITY ANGIOGRAPHY Right 05/07/2020   Procedure: Lower Extremity Angiography;  Surgeon: Marea Selinda RAMAN, MD;  Location: [facility];  Service: Cardiovascular;  Laterality: Right;   LYSIS OF ADHESION  01/25/2021   Procedure: LYSIS OF ADHESION;  Surgeon: Lane Shope, MD;  Location: [facility];  Service: General;;   PERIPHERAL VASCULAR INTERVENTION  03/08/2018   Procedure: PERIPHERAL VASCULAR INTERVENTION;  Surgeon: Marea Selinda RAMAN, MD;  Location: [facility];  Service: Cardiovascular;;   PORTA CATH INSERTION N/A 02/17/2020   Procedure: PORTA CATH INSERTION;  Surgeon: Marea Selinda RAMAN, MD;  Location: [facility];  Service: Cardiovascular;  Laterality: N/A;   REDUCTION MAMMAPLASTY  1997   SACROPLASTY N/A 10/25/2018   Procedure: S1 SACROPLASTY;  Surgeon: Flake Ozell, MD;  Location: [facility];  Service: Orthopedics;  Laterality: N/A;   FAMILY HISTORY Family History  Problem Relation Age of Onset   Coronary artery disease Father    Heart attack Father    Coronary artery disease Mother    Heart attack Mother    Ovarian cancer Sister 68       sister had hormonal therapy for IVF txs-which increased risk factor for ovarian cancer   Breast cancer Neg Hx    SOCIAL HISTORY Social History   Tobacco Use   Smoking status: Former    Current packs/day: 0.00    Average packs/day: 1 pack/day for 20.0 years (20.0 ttl pk-yrs)    Types: Cigarettes    Start date: 03/07/1976    Quit date: 03/07/1996    Years since quitting: 26.8    Passive exposure: Past   Smokeless tobacco: Never   Tobacco comments:    started smoking at age 76 but stopped smoking in 2000  Vaping Use   Vaping status: Never Used  Substance Use Topics   Alcohol use: No    Alcohol/week: 0.0 standard  drinks of alcohol   Drug use: No       OPHTHALMIC EXAM: Base Eye Exam     Visual Acuity (Snellen - Linear)       Right Left  Dist [place] 20/30 -3 20/20   Dist ph [place] 20/25 -2          Tonometry (Tonopen, 8:37 AM)       Right Left   Pressure 12 14         Pupils       Pupils Dark Light Shape React APD   Right PERRL 3 2 Round Brisk None   Left PERRL 3 2 Round Brisk None         Visual Fields       Left Right    Full Full         Extraocular Movement       Right Left    Full, Ortho Full, Ortho         Neuro/Psych     Oriented x3: Yes   Mood/Affect: Normal         Dilation     Both eyes: 2.5% Phenylephrine  @ 8:37 AM           Slit Lamp and Fundus Exam     External Exam       Right Left   External Normal Normal         Slit Lamp Exam       Right Left   Lids/Lashes dermatochalasis dermatochalasis   Conjunctiva/Sclera White and quiet White and quiet   Cornea arcus; well healed cataract wound; 2-3+ diffuse Punctate epithelial erosions, decreased TBUT, mild Anterior basement membrane dystrophy superiorly arcus; well healed cataract wound, 2-3+ diffuse Punctate epithelial erosions, irregualr epi surface, decreased TBUT   Anterior Chamber Deep and quiet Deep and quiet   Iris Round and dilated Round and dilated   Lens PCIOL; open PC PCIOL; open PC   Anterior Vitreous syneresis, Posterior vitreous detachment, vitreous condensations inferiorly syneresis, Posterior vitreous detachment         Fundus Exam       Right Left   Disc Superior hyperemia -- improved, mild Pallor Pink and Sharp   C/D Ratio 0.6 0.5   Macula Flat, Blunted foveal reflex, +cystic changes -- stably improved, +Epiretinal membrane, minimal MA flat; good foveal reflex, no heme or edema, small pigment clump IT to fovea   Vessels attenuated, Tortuous attenuated, Tortuous   Periphery Attached; scattered DBH -- greastest temporal periphery- improved Attached, no heme            IMAGING AND PROCEDURES  Imaging and Procedures for 04/25/17  OCT, Retina - OU - Both Eyes       Right Eye Quality was good. Central Foveal Thickness: 312. Progression has been stable. Findings include no IRF, no SRF, abnormal foveal contour, epiretinal membrane (Stable resolution of IRF / cystic changes temporal fovea and macula ).   Left Eye Quality was good. Central Foveal Thickness: 286. Progression has been stable. Findings include normal foveal contour, no IRF, no SRF.   Notes *Images captured and stored on drive  Diagnosis / Impression:  OD: Stable resolution of IRF / cystic changes temporal fovea and macula  OS: NFP; no IRF/SRF--stable  Clinical management:  See below  Abbreviations: NFP - Normal foveal profile. CME - cystoid macular edema. PED - pigment epithelial detachment. IRF - intraretinal fluid. SRF - subretinal fluid. EZ - ellipsoid zone. ERM - epiretinal membrane. ORA - outer retinal atrophy. ORT - outer retinal tubulation. SRHM - subretinal hyper-reflective material      Intravitreal Injection, Pharmacologic Agent - OD - Right Eye       Time Out 01/10/2023.  9:25 AM. Confirmed correct patient, procedure, site, and patient consented.   Anesthesia Topical anesthesia was used. Anesthetic medications included Lidocaine  2%, Proparacaine 0.5%.   Procedure Preparation included 5% betadine to ocular surface, eyelid speculum. A supplied (30 g) needle was used.   Injection: 6 mg faricimab -svoa 6 MG/0.05ML   Route: Intravitreal, Site: Right Eye   NDC: 49757-903-93, Lot: [id number], Expiration date: 05/02/2024, Waste: 0 mL   Post-op Post injection exam found visual acuity of at least counting fingers. The patient tolerated the procedure well. There were no complications. The patient received written and verbal post procedure care education. Post injection medications were not given.            ASSESSMENT/PLAN:    ICD-10-CM   1. Branch retinal vein  occlusion of right eye with macular edema  H34.8310 OCT, Retina - OU - Both Eyes    Intravitreal Injection, Pharmacologic Agent - OD - Right Eye    faricimab -svoa (VABYSMO ) 6mg /0.39mL intravitreal injection    2. Both eyes affected by mild nonproliferative diabetic retinopathy with macular edema, associated with type 2 diabetes mellitus (HCC)  Z88.6786     3. Long term (current) use of oral hypoglycemic drugs  Z79.84     4. Current use of insulin  (HCC)  Z79.4     5. Essential hypertension  I10     6. Hypertensive retinopathy of both eyes  H35.033     7. Epiretinal membrane (ERM) of right eye  H35.371     8. Pseudophakia of both eyes  Z96.1       1. BRVO w/ CME OD - by history, pt states symptoms first noticed 2 wks prior to presentation, but reports changes may have occurred prior  - initial exam with differential tortuosity of vessels (OD > OS) - FA (02.10.20) shows mild late staining / leakage in macula, staining / leakage of disc -- improving CME - differential includes DM2 (DME), hypertensive retinopathy, inflammatory etiology / uveitis - S/P IVA OD #1 (02.08.19), #2 (03.11.19), #3 (04.09.19), #4 (05.20.19), #5 (02.10.20) - gave IVA OD on 2.10.20 due to pending Eylea4U for 2020 -- resulted in increased IRF/CME - review of OCT show persistent IRF and cystic changes --  resistance to IVA  - June 2019 -- switched therapies:  ============================== - S/P IVE OD #1 (06.24.19), #2 (07.24.19), #3 (09.04.19), #4 (10.30.19),#5 (12.30.19), #6 (03.23.20), #7 (05.05.20), #8 (07.16.20), #9 (07.17.20), #10 (08.28.20), #11 (10.13.20), # 12 (11.17.20), #13 (2.8.21), #14 (03.09.21), #15 (04.13.21), #16 (05.11.21), #17 (06.17.21), #18 (07.23.21), #19 (08.30.21), #20 (10.04.21), #21 (11.08.21), #22 (12.08.21), #23 (01.31.22), #24 (02.28.22), #25 (04.01.22), #26 (06.15.22), #27 (07.13.22), #28 (08.17.22), #29 (09.21.22), #30 (10.19.22), #31 (11.16.22), #32 (12.16.22), #33 (01.13.23), #34  (02.10.23), #35 (03.15.23), #36 (04.14.23), #37 (05.15.23), #38 (06.12.23), #39 (07.10.23), #40 (08.07.23), #41 (09.06.23), #42 (10.04.23), #43 (11.01.23) -- IVE resistance ================================ **interval increase in IRF at 5 weeks on 07.01.24 exam (IVV)** - s/p IVV OD #1 (12.04.23), #2 (01.03.24), #3 (01.31.24), #4 (02.28.24), #5 (03.29.24), #6 (04.24.24), #7 (05.28.24), #8 (07.01.24), #9 (07.29.24), #10 (08.26.24), #11 (09.26.24), #12 (10.25.24), #13 (11.25.24) - OCT today shows Stable resolution in IRF / cystic changes temporal fovea and macula at 6 weeks  - BCVA OD 20/25  - recommend IVV OD #14 today, 01.07.24 w/ f/u in 6 weeks  - RBA of procedure discussed, questions answered  - see procedure note   - Vabysmo  informed consent form obtained, signed and scanned on 12.04.23  - f/u 6 weeks  -- DFE/OCT/possible injection  2-4. Mild nonproliferative diabetic retinopathy, both eyes - A1c 6.5 (7.25.24), 6.2 (03.14.24), 6.2 (09.12.23); 6.6 ( 07.01.23); 6.0 (10.14.22)  - could be contributing to CME OD  - OS with minimal diabetic retinopathy  - continue to monitor  5,6. Hypertensive retinopathy OU - stable  - as above, may have contributing to CME OD  - discussed importance of tight BP control  - monitor  7. Epiretinal membrane, right eye   - stable nasal ERM  - no indication for surgery at this time  8. Pseudophakia OU  - s/p CE/IOL OU by cataract surgeon in [facility]  - doing well  - monitor  Ophthalmic Meds Ordered this visit:  Meds ordered this encounter  Medications   faricimab -svoa (VABYSMO ) 6mg /0.2mL intravitreal injection     Return in about 6 weeks (around 02/21/2023) for f/u BRVO OD , DFE, OCT, Possible, IVV, OD.  This document serves as a record of services personally performed by Redell JUDITHANN Hans, MD, PhD. It was created on their behalf by Alan PARAS. Delores, OA an ophthalmic technician. The creation of this record is the provider's dictation and/or activities during  the visit.    Electronically signed by: Alan PARAS. Delores, OA 01/10/23 4:06 PM  This document serves as a record of services personally performed by Redell JUDITHANN Hans, MD, PhD. It was created on their behalf by Wanda GEANNIE Keens, COT an ophthalmic technician. The creation of this record is the provider's dictation and/or activities during the visit.    Electronically signed by:  Wanda GEANNIE Keens, COT  01/10/23 4:06 PM  Redell JUDITHANN Hans, M.D., Ph.D. Diseases & Surgery of the Retina and Vitreous [facility]  I have reviewed the above documentation for accuracy and completeness, and I agree with the above. Redell JUDITHANN Hans, M.D., Ph.D. 01/10/23 4:07 PM   Abbreviations: M myopia (nearsighted); A astigmatism; H hyperopia (farsighted); P presbyopia; Mrx spectacle prescription;  CTL contact lenses; OD right eye; OS left eye; OU both eyes  XT exotropia; ET esotropia; PEK punctate epithelial keratitis; PEE punctate epithelial erosions; DES dry eye syndrome; MGD meibomian gland dysfunction; ATs artificial tears; PFAT's preservative free artificial tears; NSC nuclear sclerotic cataract; PSC posterior subcapsular cataract; ERM epi-retinal membrane; PVD posterior vitreous detachment; RD retinal detachment; DM diabetes mellitus; DR diabetic retinopathy; NPDR non-proliferative diabetic retinopathy; PDR proliferative diabetic retinopathy; CSME clinically significant macular edema; DME diabetic macular edema; dbh dot blot hemorrhages; CWS cotton wool spot; POAG primary open angle glaucoma; C/D cup-to-disc ratio; HVF humphrey visual field; GVF goldmann visual field; OCT optical coherence tomography; IOP intraocular pressure; BRVO Branch retinal vein occlusion; CRVO central retinal vein occlusion; CRAO central retinal artery occlusion; BRAO branch retinal artery occlusion; RT retinal tear; SB scleral buckle; PPV pars plana vitrectomy; VH Vitreous hemorrhage; PRP panretinal laser photocoagulation;  IVK intravitreal kenalog; VMT vitreomacular traction; MH Macular hole;  NVD neovascularization of the disc; NVE neovascularization elsewhere; AREDS age related eye disease study; ARMD age related macular degeneration; POAG primary open angle glaucoma; EBMD epithelial/anterior basement membrane dystrophy; ACIOL anterior chamber intraocular lens; IOL intraocular lens; PCIOL posterior chamber intraocular lens; Phaco/IOL phacoemulsification with intraocular lens placement; PRK photorefractive keratectomy; LASIK laser assisted in situ keratomileusis; HTN hypertension; DM diabetes mellitus; COPD chronic obstructive pulmonary disease

## 2023-02-09 NOTE — Progress Notes (Signed)
[facility] - Clinic Note  02/21/2023     CHIEF COMPLAINT Patient presents for Retina Follow Up   HISTORY OF PRESENT ILLNESS: Cheryl Leonard is a 78 y.o. female who presents to the clinic today for:    HPI     Retina Follow Up   Patient presents with  CRVO/BRVO.  In both eyes.  This started 6 weeks ago.  Duration of 6 weeks.  Since onset it is stable.  I, the attending physician,  performed the HPI with the patient and updated documentation appropriately.        Comments   6 week retina follow up BRVO OD and IVV OD pt is reporting no vision changes noticed she has been having itching and watering not using any gtts she denies any flashes or floaters       Last edited by [name], MD on 02/21/2023  5:31 PM.    Pt states her blood sugar has been high (between 200-300), she is on insulin, she states she has "been eating bad"  Referring physician: Danella Penton, MD [address] [facility] [place],  [place] [address]   HISTORICAL INFORMATION:   Selected notes from the MEDICAL RECORD NUMBER Referred by Dr. Senaida Ores for concern of DME OD Lab Results  Component Value Date   HGBA1C 7.3 (H) 01/11/2021     CURRENT MEDICATIONS: No current outpatient medications on file. (Ophthalmic Drugs)   No current facility-administered medications for this visit. (Ophthalmic Drugs)   Current Outpatient Medications (Other)  Medication Sig   ALPRAZolam (XANAX) 0.25 MG tablet Take 0.25 mg by mouth daily as needed for anxiety.   aspirin EC 81 MG tablet Take 1 tablet (81 mg total) by mouth daily.   cholecalciferol (VITAMIN D) 1000 units tablet Take 1,000 Units by mouth 2 (two) times daily.   dapagliflozin propanediol (FARXIGA) 5 MG TABS tablet Take 5 mg by mouth daily.   denosumab (PROLIA) 60 MG/ML SOLN injection Inject 60 mg into the skin every 6 (six) months.    escitalopram (LEXAPRO) 10 MG tablet Take 10 mg by mouth daily.    estradiol (ESTRACE) 0.1 MG/GM vaginal cream Place 1 Applicatorful vaginally daily as needed (vaginal irritation).   famotidine (PEPCID) 40 MG tablet Take 40 mg by mouth daily.   furosemide (LASIX) 20 MG tablet Take 20 mg by mouth every Wednesday.   gabapentin (NEURONTIN) 300 MG capsule Take 300 mg by mouth at bedtime as needed (pain).   HYDROcodone-acetaminophen (NORCO/VICODIN) 5-325 MG tablet Take 1 tablet by mouth daily.   levothyroxine (SYNTHROID, LEVOTHROID) 100 MCG tablet Take 100 mcg by mouth daily before breakfast.    lidocaine-prilocaine (EMLA) cream Apply 1 application topically as needed (apply prior to port a cath access).   Magnesium 500 MG TABS Take 500 mg by mouth every morning.    metoprolol succinate (TOPROL-XL) 50 MG 24 hr tablet Take 50 mg by mouth daily. Take with or immediately following a meal.   mirtazapine (REMERON) 15 MG tablet Take 15 mg by mouth as needed.   Multiple Vitamin (MULTIVITAMIN WITH MINERALS) TABS tablet Take 1 tablet by mouth daily. Centrum Silver   NOVOLOG FLEXPEN 100 UNIT/ML FlexPen SMARTSIG:8 Unit(s) SUB-Q Morning-Evening   nystatin cream (MYCOSTATIN) Apply 1 application topically daily as needed (Yeast infection).   olmesartan (BENICAR) 20 MG tablet Take 20 mg by mouth daily.   pantoprazole (PROTONIX) 40 MG tablet Take 40 mg by mouth daily.   rivaroxaban (  XARELTO) 20 MG TABS tablet Take 1 tablet (20 mg total) by mouth daily with supper.   rosuvastatin (CRESTOR) 20 MG tablet Take 20 mg by mouth every morning.   sodium bicarbonate 650 MG tablet Take 650 mg by mouth 2 (two) times daily.   TRESIBA FLEXTOUCH 200 UNIT/ML SOPN Inject 25-30 Units as directed at bedtime. Titrate according to fasting blood glucose not to exceed 50 units a day   vitamin B-12 (CYANOCOBALAMIN) 1000 MCG tablet Take 1,000 mcg by mouth daily.   vitamin C (ASCORBIC ACID) 250 MG tablet Take 250 mg by mouth daily.   vitamin E 400 UNIT capsule Take 400 Units by mouth daily.   Zinc Sulfate  (ZINC 15 PO) Take 1 tablet by mouth daily.   zolpidem (AMBIEN) 10 MG tablet Take 10 mg by mouth at bedtime.   No current facility-administered medications for this visit. (Other)   REVIEW OF SYSTEMS: ROS   Positive for: Gastrointestinal, Musculoskeletal, Endocrine, Cardiovascular, Eyes, Respiratory Negative for: Constitutional, Neurological, Skin, Genitourinary, HENT, Psychiatric, Allergic/Imm, Heme/Lymph Last edited by Etheleen Mayhew, COT on 02/21/2023  2:23 PM.     ALLERGIES No Known Allergies  PAST MEDICAL HISTORY Past Medical History:  Diagnosis Date   Anemia    Anxiety    Arthritis    Gout   Cataracts, both eyes    Diabetic retinopathy (HCC)    NPDR OU   Diverticulitis    GERD (gastroesophageal reflux disease)    Gout    Headache    h/o migraines   History of fracture of patella    right knee   History of positive PPD    Patient always shows positive   Hyperlipidemia    Hypertension    Hypertensive retinopathy    OU   Hypothyroidism    Lichen sclerosus 12/30/2013   of vulva   Metatarsal fracture    Neuropathy    Osteopenia    Peripheral vascular disease (HCC)    Polyneuropathy    numbness and tingling in feet and toes   Renal insufficiency    Stage 3   Sleep apnea    does not use cpap-lost weight    Type 2 diabetes mellitus, uncontrolled    Past Surgical History:  Procedure Laterality Date   ABDOMINAL HYSTERECTOMY     AMPUTATION TOE Right 05/08/2020   Procedure: AMPUTATION TOE-Right 4th Toe;  Surgeon: Rosetta Posner, DPM;  Location: [facility];  Service: Podiatry;  Laterality: Right;   AMPUTATION TOE Left 04/22/2021   Procedure: AMPUTATION TOE - 4TH METARSOPHANGEAL JOINT;  Surgeon: Rosetta Posner, DPM;  Location: [facility];  Service: Podiatry;  Laterality: Left;   APPENDECTOMY     BREAST REDUCTION SURGERY  2001   CATARACT EXTRACTION     CESAREAN SECTION  1976   COLONOSCOPY  03/05/2013   Nml - due for repeat 03/06/2018   COLONOSCOPY WITH PROPOFOL N/A  03/18/2019   Procedure: COLONOSCOPY WITH PROPOFOL;  Surgeon: Toledo, [name], MD;  Location: [facility];  Service: Gastroenterology;  Laterality: N/A;   DIAGNOSTIC LAPAROSCOPY     DILATION AND CURETTAGE OF UTERUS  1989   ENDARTERECTOMY FEMORAL Bilateral 03/09/2018   Procedure: ENDARTERECTOMY FEMORAL;  Surgeon: Annice Needy, MD;  Location: [facility];  Service: Vascular;  Laterality: Bilateral;   ENDARTERECTOMY POPLITEAL Left 03/09/2018   Procedure: ENDARTERECTOMY POPLITEAL AND SFA;  Surgeon: Annice Needy, MD;  Location: [facility];  Service: Vascular;  Laterality: Left;   ESOPHAGOGASTRODUODENOSCOPY  03/05/2013   ESOPHAGOGASTRODUODENOSCOPY (EGD)  WITH PROPOFOL N/A 03/18/2019   Procedure: ESOPHAGOGASTRODUODENOSCOPY (EGD) WITH PROPOFOL;  Surgeon: Toledo, [name], MD;  Location: [facility];  Service: Gastroenterology;  Laterality: N/A;   EYE SURGERY     Eyelid Surgery  2012   INTRAMEDULLARY (IM) NAIL INTERTROCHANTERIC Left 10/30/2015   Procedure: INTRAMEDULLARY (IM) NAIL INTERTROCHANTRIC ;  Surgeon: Kennedy Bucker, MD;  Location: [facility];  Service: Orthopedics;  Laterality: Left;   KYPHOPLASTY N/A 10/25/2018   Procedure: L4 KYPHOPLASTY;  Surgeon: Kennedy Bucker, MD;  Location: [facility];  Service: Orthopedics;  Laterality: N/A;   LAPAROSCOPIC HYSTERECTOMY  2000   total   LOWER EXTREMITY ANGIOGRAPHY Left 03/08/2017   Procedure: LOWER EXTREMITY ANGIOGRAPHY;  Surgeon: Annice Needy, MD;  Location: [facility];  Service: Cardiovascular;  Laterality: Left;   LOWER EXTREMITY ANGIOGRAPHY Left 10/30/2017   Procedure: LOWER EXTREMITY ANGIOGRAPHY;  Surgeon: Annice Needy, MD;  Location: [facility];  Service: Cardiovascular;  Laterality: Left;   LOWER EXTREMITY ANGIOGRAPHY Right 03/08/2018   Procedure: LOWER EXTREMITY ANGIOGRAPHY;  Surgeon: Annice Needy, MD;  Location: [facility];  Service: Cardiovascular;  Laterality: Right;   LOWER EXTREMITY ANGIOGRAPHY Left 10/01/2018   Procedure:  LOWER EXTREMITY ANGIOGRAPHY;  Surgeon: Annice Needy, MD;  Location: [facility];  Service: Cardiovascular;  Laterality: Left;   LOWER EXTREMITY ANGIOGRAPHY Right 10/08/2018   Procedure: LOWER EXTREMITY ANGIOGRAPHY;  Surgeon: Annice Needy, MD;  Location: [facility];  Service: Cardiovascular;  Laterality: Right;   LOWER EXTREMITY ANGIOGRAPHY Right 05/07/2020   Procedure: Lower Extremity Angiography;  Surgeon: Annice Needy, MD;  Location: [facility];  Service: Cardiovascular;  Laterality: Right;   LYSIS OF ADHESION  01/25/2021   Procedure: LYSIS OF ADHESION;  Surgeon: Campbell Lerner, MD;  Location: [facility];  Service: General;;   PERIPHERAL VASCULAR INTERVENTION  03/08/2018   Procedure: PERIPHERAL VASCULAR INTERVENTION;  Surgeon: Annice Needy, MD;  Location: [facility];  Service: Cardiovascular;;   PORTA CATH INSERTION N/A 02/17/2020   Procedure: PORTA CATH INSERTION;  Surgeon: Annice Needy, MD;  Location: [facility];  Service: Cardiovascular;  Laterality: N/A;   REDUCTION MAMMAPLASTY  1997   SACROPLASTY N/A 10/25/2018   Procedure: S1 SACROPLASTY;  Surgeon: Kennedy Bucker, MD;  Location: [facility];  Service: Orthopedics;  Laterality: N/A;   FAMILY HISTORY Family History  Problem Relation Age of Onset   Coronary artery disease Father    Heart attack Father    Coronary artery disease Mother    Heart attack Mother    Ovarian cancer Sister 36       sister had hormonal therapy for IVF txs-which increased risk factor for ovarian cancer   Breast cancer Neg Hx    SOCIAL HISTORY Social History   Tobacco Use   Smoking status: Former    Current packs/day: 0.00    Average packs/day: 1 pack/day for 20.0 years (20.0 ttl pk-yrs)    Types: Cigarettes    Start date: 03/07/1976    Quit date: 03/07/1996    Years since quitting: 26.9    Passive exposure: Past   Smokeless tobacco: Never   Tobacco comments:    started smoking at age 7 but stopped smoking in 2000   Vaping Use   Vaping status: Never Used  Substance Use Topics   Alcohol use: No    Alcohol/week: 0.0 standard drinks of alcohol   Drug use: No  OPHTHALMIC EXAM: Base Eye Exam     Visual Acuity (Snellen - Linear)       Right Left   Dist [place] 20/30 -1 20/25   Dist ph [place] 20/30 20/20         Tonometry (Tonopen, 2:28 PM)       Right Left   Pressure 12 15         Pupils       Pupils Dark Light Shape React APD   Right PERRL 3 2 Round Brisk None   Left PERRL 3 2 Round Brisk None         Visual Fields       Left Right    Full Full         Extraocular Movement       Right Left    Full, Ortho Full, Ortho         Neuro/Psych     Oriented x3: Yes   Mood/Affect: Normal         Dilation     Both eyes: 2.5% Phenylephrine @ 2:28 PM           Slit Lamp and Fundus Exam     External Exam       Right Left   External Normal Normal         Slit Lamp Exam       Right Left   Lids/Lashes dermatochalasis dermatochalasis   Conjunctiva/Sclera White and quiet Trace Injection   Cornea arcus; well healed cataract wound; 2-3+ diffuse Punctate epithelial erosions, decreased TBUT, mild Anterior basement membrane dystrophy superiorly arcus; well healed cataract wound, 1-2+ Punctate epithelial erosions, tear film debris   Anterior Chamber Deep and quiet Deep and quiet   Iris Round and dilated Round and dilated   Lens PCIOL; open PC PCIOL; open PC   Anterior Vitreous syneresis, Posterior vitreous detachment, vitreous condensations inferiorly syneresis, Posterior vitreous detachment         Fundus Exam       Right Left   Disc Superior hyperemia -- stably improved, mild Pallor Pink and Sharp   C/D Ratio 0.6 0.5   Macula Flat, Blunted foveal reflex, +cystic changes -- increased, +Epiretinal membrane, minimal MA flat; good foveal reflex, no heme or edema, small pigment clump IT to fovea   Vessels attenuated, Tortuous attenuated, Tortuous   Periphery  Attached; scattered DBH -- greastest temporal periphery- improved Attached, no heme           IMAGING AND PROCEDURES  Imaging and Procedures for 04/25/17  OCT, Retina - OU - Both Eyes       Right Eye Quality was good. Central Foveal Thickness: 346. Progression has worsened. Findings include no IRF, no SRF, abnormal foveal contour, epiretinal membrane (Interval re-development of IRF / cystic changes temporal fovea and macula ).   Left Eye Quality was good. Central Foveal Thickness: 289. Progression has been stable. Findings include normal foveal contour, no IRF, no SRF.   Notes *Images captured and stored on drive  Diagnosis / Impression:  OD: Interval re-development of IRF / cystic changes temporal fovea and macula  OS: NFP; no IRF/SRF--stable  Clinical management:  See below  Abbreviations: NFP - Normal foveal profile. CME - cystoid macular edema. PED - pigment epithelial detachment. IRF - intraretinal fluid. SRF - subretinal fluid. EZ - ellipsoid zone. ERM - epiretinal membrane. ORA - outer retinal atrophy. ORT - outer retinal tubulation. SRHM - subretinal hyper-reflective material  Intravitreal Injection, Pharmacologic Agent - OD - Right Eye       Time Out 02/21/2023. 3:48 PM. Confirmed correct patient, procedure, site, and patient consented.   Anesthesia Topical anesthesia was used. Anesthetic medications included Lidocaine 2%, Proparacaine 0.5%.   Procedure Preparation included 5% betadine to ocular surface, eyelid speculum. A supplied (30 g) needle was used.   Injection: 6 mg faricimab-svoa 6 MG/0.05ML   Route: Intravitreal, Site: Right Eye   NDC: R2083049, Lot: [id number], Expiration date: 05/02/2024, Waste: 0 mL   Post-op Post injection exam found visual acuity of at least counting fingers. The patient tolerated the procedure well. There were no complications. The patient received written and verbal post procedure care education. Post injection  medications were not given.            ASSESSMENT/PLAN:    ICD-10-CM   1. Branch retinal vein occlusion of right eye with macular edema  H34.8310 OCT, Retina - OU - Both Eyes    Intravitreal Injection, Pharmacologic Agent - OD - Right Eye    faricimab-svoa (VABYSMO) 6mg /0.50mL intravitreal injection    2. Both eyes affected by mild nonproliferative diabetic retinopathy with macular edema, associated with type 2 diabetes mellitus (HCC)  V40.9811     3. Long term (current) use of oral hypoglycemic drugs  Z79.84     4. Current use of insulin (HCC)  Z79.4     5. Essential hypertension  I10     6. Hypertensive retinopathy of both eyes  H35.033     7. Epiretinal membrane (ERM) of right eye  H35.371     8. Pseudophakia of both eyes  Z96.1      1. BRVO w/ CME OD - by history, pt states symptoms first noticed 2 wks prior to presentation, but reports changes may have occurred prior  - initial exam with differential tortuosity of vessels (OD > OS) - FA (02.10.20) shows mild late staining / leakage in macula, staining / leakage of disc -- improving CME - differential includes DM2 (DME), hypertensive retinopathy, inflammatory etiology / uveitis - S/P IVA OD #1 (02.08.19), #2 (03.11.19), #3 (04.09.19), #4 (05.20.19), #5 (02.10.20) - gave IVA OD on 2.10.20 due to pending Eylea4U for 2020 -- resulted in increased IRF/CME - review of OCT show persistent IRF and cystic changes --  resistance to IVA  - June 2019 -- switched therapies:  ============================== - S/P IVE OD #1 (06.24.19), #2 (07.24.19), #3 (09.04.19), #4 (10.30.19),#5 (12.30.19), #6 (03.23.20), #7 (05.05.20), #8 (07.16.20), #9 (07.17.20), #10 (08.28.20), #11 (10.13.20), # 12 (11.17.20), #13 (2.8.21), #14 (03.09.21), #15 (04.13.21), #16 (05.11.21), #17 (06.17.21), #18 (07.23.21), #19 (08.30.21), #20 (10.04.21), #21 (11.08.21), #22 (12.08.21), #23 (01.31.22), #24 (02.28.22), #25 (04.01.22), #26 (06.15.22), #27 (07.13.22), #28  (08.17.22), #29 (09.21.22), #30 (10.19.22), #31 (11.16.22), #32 (12.16.22), #33 (01.13.23), #34 (02.10.23), #35 (03.15.23), #36 (04.14.23), #37 (05.15.23), #38 (06.12.23), #39 (07.10.23), #40 (08.07.23), #41 (09.06.23), #42 (10.04.23), #43 (11.01.23) -- IVE resistance ================================ **interval increase in IRF at 5 weeks on 07.01.24 exam (IVV)** - s/p IVV OD #1 (12.04.23), #2 (01.03.24), #3 (01.31.24), #4 (02.28.24), #5 (03.29.24), #6 (04.24.24), #7 (05.28.24), #8 (07.01.24), #9 (07.29.24), #10 (08.26.24), #11 (09.26.24), #12 (10.25.24), #13 (11.25.24), #14 (01.07.24) - OCT today shows interval re-development in IRF / cystic changes temporal fovea and macula at 6 weeks  - BCVA OD 20/30 from 20/25  - recommend IVV OD #15 today, 02.18.25 w/ f/u back to 5 weeks  - RBA of procedure discussed, questions answered  - see procedure note   -  Vabysmo informed consent form obtained, signed and scanned on 02.18.25  - f/u 5 weeks  -- DFE/OCT/possible injection  2-4. Mild nonproliferative diabetic retinopathy, both eyes - A1c 7.5 on 02.03.25  - there could be a DME component contributing to CME OD  - OD shows interval increase in central IRF and pt reports poor BG control since last visit  - OS with minimal diabetic retinopathy  - IVV OD as above  - monitor OS  5,6. Hypertensive retinopathy OU - stable  - as above, may have contributing to CME OD  - discussed importance of tight BP control  - monitor  7. Epiretinal membrane, right eye   - stable nasal ERM  - no indication for surgery at this time  8. Pseudophakia OU  - s/p CE/IOL OU by cataract surgeon in [facility] [place] [facility]  - doing well  - monitor  Ophthalmic Meds Ordered this visit:  Meds ordered this encounter  Medications   faricimab-svoa (VABYSMO) 6mg /0.60mL intravitreal injection     Return in about 5 weeks (around 03/28/2023) for f/u BRVO OD, DFE, OCT.   This document serves as a record of services personally performed by Karie Chimera, MD, PhD. It was created on their behalf by Charlette Caffey, COT an ophthalmic technician. The creation of this record is the provider's dictation and/or activities during the visit.    Electronically signed by:  Charlette Caffey, COT  02/21/23 5:33 PM  This document serves as a record of services personally performed by Karie Chimera, MD, PhD. It was created on their behalf by Glee Arvin. Manson Passey, OA an ophthalmic technician. The creation of this record is the provider's dictation and/or activities during the visit.    Electronically signed by: Glee Arvin. Manson Passey, OA 02/21/23 5:33 PM   Karie Chimera, M.D., Ph.D. Diseases & Surgery of the Retina and Vitreous [facility]  I have reviewed the above documentation for accuracy and completeness, and I agree with the above. Karie Chimera, M.D., Ph.D. 02/21/23 5:35 PM   Abbreviations: M myopia (nearsighted); A astigmatism; H hyperopia (farsighted); P presbyopia; Mrx spectacle prescription;  CTL contact lenses; OD right eye; OS left eye; OU both eyes  XT exotropia; ET esotropia; PEK punctate epithelial keratitis; PEE punctate epithelial erosions; DES dry eye syndrome; MGD meibomian gland dysfunction; ATs artificial tears; PFAT's preservative free artificial tears; NSC nuclear sclerotic cataract; PSC posterior subcapsular cataract; ERM epi-retinal membrane; PVD posterior vitreous detachment; RD retinal detachment; DM diabetes mellitus; DR diabetic retinopathy; NPDR non-proliferative diabetic retinopathy; PDR proliferative diabetic retinopathy; CSME clinically significant macular edema; DME diabetic macular edema; dbh dot blot hemorrhages; CWS cotton wool spot; POAG primary open angle glaucoma; C/D cup-to-disc ratio; HVF humphrey visual field; GVF goldmann visual field; OCT optical coherence tomography; IOP intraocular pressure; BRVO Branch retinal vein occlusion; CRVO central retinal vein occlusion; CRAO central retinal  artery occlusion; BRAO branch retinal artery occlusion; RT retinal tear; SB scleral buckle; PPV pars plana vitrectomy; VH Vitreous hemorrhage; PRP panretinal laser photocoagulation; IVK intravitreal kenalog; VMT vitreomacular traction; MH Macular hole;  NVD neovascularization of the disc; NVE neovascularization elsewhere; AREDS age related eye disease study; ARMD age related macular degeneration; POAG primary open angle glaucoma; EBMD epithelial/anterior basement membrane dystrophy; ACIOL anterior chamber intraocular lens; IOL intraocular lens; PCIOL posterior chamber intraocular lens; Phaco/IOL phacoemulsification with intraocular lens placement; PRK photorefractive keratectomy; LASIK laser assisted in situ keratomileusis; HTN hypertension; DM diabetes mellitus; COPD chronic obstructive pulmonary disease

## 2023-02-14 ENCOUNTER — Inpatient Hospital Stay: Payer: Medicare Other | Attending: Internal Medicine

## 2023-02-14 ENCOUNTER — Inpatient Hospital Stay: Payer: Medicare Other

## 2023-02-14 DIAGNOSIS — D631 Anemia in chronic kidney disease: Secondary | ICD-10-CM

## 2023-02-14 DIAGNOSIS — E1122 Type 2 diabetes mellitus with diabetic chronic kidney disease: Secondary | ICD-10-CM | POA: Diagnosis not present

## 2023-02-14 DIAGNOSIS — N183 Chronic kidney disease, stage 3 unspecified: Secondary | ICD-10-CM | POA: Diagnosis not present

## 2023-02-14 DIAGNOSIS — D649 Anemia, unspecified: Secondary | ICD-10-CM | POA: Insufficient documentation

## 2023-02-14 LAB — SAMPLE TO BLOOD BANK

## 2023-02-14 LAB — HEMOGLOBIN AND HEMATOCRIT (CANCER CENTER ONLY)
HCT: 30.8 % — ABNORMAL LOW (ref 36.0–46.0)
Hemoglobin: 10 g/dL — ABNORMAL LOW (ref 12.0–15.0)

## 2023-02-14 MED ORDER — HEPARIN SOD (PORK) LOCK FLUSH 100 UNIT/ML IV SOLN
500.0000 [IU] | Freq: Once | INTRAVENOUS | Status: AC
  Administered 2023-02-14: 500 [IU] via INTRAVENOUS
  Filled 2023-02-14: qty 5

## 2023-02-14 MED ORDER — SODIUM CHLORIDE 0.9% FLUSH
10.0000 mL | Freq: Once | INTRAVENOUS | Status: AC
Start: 2023-02-14 — End: 2023-02-14
  Administered 2023-02-14: 10 mL via INTRAVENOUS
  Filled 2023-02-14: qty 10

## 2023-02-14 NOTE — Progress Notes (Signed)
Hgb is 10; hold retacrit today

## 2023-02-21 ENCOUNTER — Encounter (INDEPENDENT_AMBULATORY_CARE_PROVIDER_SITE_OTHER): Payer: Self-pay | Admitting: Ophthalmology

## 2023-02-21 ENCOUNTER — Ambulatory Visit (INDEPENDENT_AMBULATORY_CARE_PROVIDER_SITE_OTHER): Payer: Medicare Other | Admitting: Ophthalmology

## 2023-02-21 DIAGNOSIS — H35033 Hypertensive retinopathy, bilateral: Secondary | ICD-10-CM

## 2023-02-21 DIAGNOSIS — Z961 Presence of intraocular lens: Secondary | ICD-10-CM

## 2023-02-21 DIAGNOSIS — E113213 Type 2 diabetes mellitus with mild nonproliferative diabetic retinopathy with macular edema, bilateral: Secondary | ICD-10-CM

## 2023-02-21 DIAGNOSIS — Z7984 Long term (current) use of oral hypoglycemic drugs: Secondary | ICD-10-CM | POA: Diagnosis not present

## 2023-02-21 DIAGNOSIS — Z794 Long term (current) use of insulin: Secondary | ICD-10-CM

## 2023-02-21 DIAGNOSIS — H35371 Puckering of macula, right eye: Secondary | ICD-10-CM

## 2023-02-21 DIAGNOSIS — I10 Essential (primary) hypertension: Secondary | ICD-10-CM

## 2023-02-21 DIAGNOSIS — H34831 Tributary (branch) retinal vein occlusion, right eye, with macular edema: Secondary | ICD-10-CM

## 2023-02-21 MED ORDER — FARICIMAB-SVOA 6 MG/0.05ML IZ SOSY
6.0000 mg | PREFILLED_SYRINGE | INTRAVITREAL | Status: AC | PRN
  Administered 2023-02-21: 6 mg via INTRAVITREAL

## 2023-03-16 NOTE — Progress Notes (Addendum)
 [facility] - Clinic Note  03/28/2023     CHIEF COMPLAINT Patient presents for Retina Follow Up   HISTORY OF PRESENT ILLNESS: Cheryl Leonard is a 78 y.o. female who presents to the clinic today for:    HPI     Retina Follow Up   Patient presents with  CRVO/BRVO.  In right eye.  This started 5 weeks ago.  I, the attending physician,  performed the HPI with the patient and updated documentation appropriately.        Comments   Patient here for 5 weeks retina follow up for BRVO OD. Patient states vision the same. No eye pain. Uses AT's prn.      Last edited by [name], MD on 03/28/2023  3:49 PM.    Pt states her blood sugar has been high (between 200-300), she is on insulin, she states she has "been eating bad"  Referring physician: Danella Penton, MD [address] [facility] [address] [facility] [place],  [place] [address]   HISTORICAL INFORMATION:   Selected notes from the MEDICAL RECORD NUMBER Referred by Dr. Senaida Ores for concern of DME OD Lab Results  Component Value Date   HGBA1C 7.3 (H) 01/11/2021     CURRENT MEDICATIONS: No current outpatient medications on file. (Ophthalmic Drugs)   No current facility-administered medications for this visit. (Ophthalmic Drugs)   Current Outpatient Medications (Other)  Medication Sig   ALPRAZolam (XANAX) 0.25 MG tablet Take 0.25 mg by mouth daily as needed for anxiety.   aspirin EC 81 MG tablet Take 1 tablet (81 mg total) by mouth daily.   cholecalciferol (VITAMIN D) 1000 units tablet Take 1,000 Units by mouth 2 (two) times daily.   dapagliflozin propanediol (FARXIGA) 5 MG TABS tablet Take 5 mg by mouth daily.   denosumab (PROLIA) 60 MG/ML SOLN injection Inject 60 mg into the skin every 6 (six) months.    escitalopram (LEXAPRO) 10 MG tablet Take 10 mg by mouth daily.   estradiol (ESTRACE) 0.1 MG/GM vaginal cream Place 1 Applicatorful vaginally daily as needed (vaginal irritation).    famotidine (PEPCID) 40 MG tablet Take 40 mg by mouth daily.   furosemide (LASIX) 20 MG tablet Take 20 mg by mouth every Wednesday.   gabapentin (NEURONTIN) 300 MG capsule Take 300 mg by mouth at bedtime as needed (pain).   HYDROcodone-acetaminophen (NORCO/VICODIN) 5-325 MG tablet Take 1 tablet by mouth daily.   levothyroxine (SYNTHROID, LEVOTHROID) 100 MCG tablet Take 100 mcg by mouth daily before breakfast.    lidocaine-prilocaine (EMLA) cream Apply 1 application topically as needed (apply prior to port a cath access).   Magnesium 500 MG TABS Take 500 mg by mouth every morning.    metoprolol succinate (TOPROL-XL) 50 MG 24 hr tablet Take 50 mg by mouth daily. Take with or immediately following a meal.   mirtazapine (REMERON) 15 MG tablet Take 15 mg by mouth as needed.   Multiple Vitamin (MULTIVITAMIN WITH MINERALS) TABS tablet Take 1 tablet by mouth daily. Centrum Silver   NOVOLOG FLEXPEN 100 UNIT/ML FlexPen SMARTSIG:8 Unit(s) SUB-Q Morning-Evening   nystatin cream (MYCOSTATIN) Apply 1 application topically daily as needed (Yeast infection).   olmesartan (BENICAR) 20 MG tablet Take 20 mg by mouth daily.   pantoprazole (PROTONIX) 40 MG tablet Take 40 mg by mouth daily.   rivaroxaban (XARELTO) 20 MG TABS tablet Take 1 tablet (20 mg total) by mouth daily with supper.   rosuvastatin (CRESTOR) 20 MG tablet Take  20 mg by mouth every morning.   sodium bicarbonate 650 MG tablet Take 650 mg by mouth 2 (two) times daily.   TRESIBA FLEXTOUCH 200 UNIT/ML SOPN Inject 25-30 Units as directed at bedtime. Titrate according to fasting blood glucose not to exceed 50 units a day   vitamin B-12 (CYANOCOBALAMIN) 1000 MCG tablet Take 1,000 mcg by mouth daily.   vitamin C (ASCORBIC ACID) 250 MG tablet Take 250 mg by mouth daily.   vitamin E 400 UNIT capsule Take 400 Units by mouth daily.   Zinc Sulfate (ZINC 15 PO) Take 1 tablet by mouth daily.   zolpidem (AMBIEN) 10 MG tablet Take 10 mg by mouth at bedtime.   No  current facility-administered medications for this visit. (Other)   REVIEW OF SYSTEMS: ROS   Positive for: Gastrointestinal, Musculoskeletal, Endocrine, Cardiovascular, Eyes, Respiratory Negative for: Constitutional, Neurological, Skin, Genitourinary, HENT, Psychiatric, Allergic/Imm, Heme/Lymph Last edited by Laddie Aquas, COA on 03/28/2023  2:18 PM.      ALLERGIES No Known Allergies  PAST MEDICAL HISTORY Past Medical History:  Diagnosis Date   Anemia    Anxiety    Arthritis    Gout   Cataracts, both eyes    Diabetic retinopathy (HCC)    NPDR OU   Diverticulitis    GERD (gastroesophageal reflux disease)    Gout    Headache    h/o migraines   History of fracture of patella    right knee   History of positive PPD    Patient always shows positive   Hyperlipidemia    Hypertension    Hypertensive retinopathy    OU   Hypothyroidism    Lichen sclerosus 12/30/2013   of vulva   Metatarsal fracture    Neuropathy    Osteopenia    Peripheral vascular disease (HCC)    Polyneuropathy    numbness and tingling in feet and toes   Renal insufficiency    Stage 3   Sleep apnea    does not use cpap-lost weight    Type 2 diabetes mellitus, uncontrolled    Past Surgical History:  Procedure Laterality Date   ABDOMINAL HYSTERECTOMY     AMPUTATION TOE Right 05/08/2020   Procedure: AMPUTATION TOE-Right 4th Toe;  Surgeon: Rosetta Posner, DPM;  Location: [facility];  Service: Podiatry;  Laterality: Right;   AMPUTATION TOE Left 04/22/2021   Procedure: AMPUTATION TOE - 4TH METARSOPHANGEAL JOINT;  Surgeon: Rosetta Posner, DPM;  Location: [facility];  Service: Podiatry;  Laterality: Left;   APPENDECTOMY     BREAST REDUCTION SURGERY  2001   CATARACT EXTRACTION     CESAREAN SECTION  1976   COLONOSCOPY  03/05/2013   Nml - due for repeat 03/06/2018   COLONOSCOPY WITH PROPOFOL N/A 03/18/2019   Procedure: COLONOSCOPY WITH PROPOFOL;  Surgeon: Toledo, [name], MD;  Location: [facility];   Service: Gastroenterology;  Laterality: N/A;   DIAGNOSTIC LAPAROSCOPY     DILATION AND CURETTAGE OF UTERUS  1989   ENDARTERECTOMY FEMORAL Bilateral 03/09/2018   Procedure: ENDARTERECTOMY FEMORAL;  Surgeon: Annice Needy, MD;  Location: [facility];  Service: Vascular;  Laterality: Bilateral;   ENDARTERECTOMY POPLITEAL Left 03/09/2018   Procedure: ENDARTERECTOMY POPLITEAL AND SFA;  Surgeon: Annice Needy, MD;  Location: [facility];  Service: Vascular;  Laterality: Left;   ESOPHAGOGASTRODUODENOSCOPY  03/05/2013   ESOPHAGOGASTRODUODENOSCOPY (EGD) WITH PROPOFOL N/A 03/18/2019   Procedure: ESOPHAGOGASTRODUODENOSCOPY (EGD) WITH PROPOFOL;  Surgeon: Toledo, [name], MD;  Location: [facility];  Service:  Gastroenterology;  Laterality: N/A;   EYE SURGERY     Eyelid Surgery  2012   INTRAMEDULLARY (IM) NAIL INTERTROCHANTERIC Left 10/30/2015   Procedure: INTRAMEDULLARY (IM) NAIL INTERTROCHANTRIC ;  Surgeon: Kennedy Bucker, MD;  Location: [facility];  Service: Orthopedics;  Laterality: Left;   KYPHOPLASTY N/A 10/25/2018   Procedure: L4 KYPHOPLASTY;  Surgeon: Kennedy Bucker, MD;  Location: [facility];  Service: Orthopedics;  Laterality: N/A;   LAPAROSCOPIC HYSTERECTOMY  2000   total   LOWER EXTREMITY ANGIOGRAPHY Left 03/08/2017   Procedure: LOWER EXTREMITY ANGIOGRAPHY;  Surgeon: Annice Needy, MD;  Location: [facility];  Service: Cardiovascular;  Laterality: Left;   LOWER EXTREMITY ANGIOGRAPHY Left 10/30/2017   Procedure: LOWER EXTREMITY ANGIOGRAPHY;  Surgeon: Annice Needy, MD;  Location: [facility];  Service: Cardiovascular;  Laterality: Left;   LOWER EXTREMITY ANGIOGRAPHY Right 03/08/2018   Procedure: LOWER EXTREMITY ANGIOGRAPHY;  Surgeon: Annice Needy, MD;  Location: [facility];  Service: Cardiovascular;  Laterality: Right;   LOWER EXTREMITY ANGIOGRAPHY Left 10/01/2018   Procedure: LOWER EXTREMITY ANGIOGRAPHY;  Surgeon: Annice Needy, MD;  Location: [facility];  Service: Cardiovascular;   Laterality: Left;   LOWER EXTREMITY ANGIOGRAPHY Right 10/08/2018   Procedure: LOWER EXTREMITY ANGIOGRAPHY;  Surgeon: Annice Needy, MD;  Location: [facility];  Service: Cardiovascular;  Laterality: Right;   LOWER EXTREMITY ANGIOGRAPHY Right 05/07/2020   Procedure: Lower Extremity Angiography;  Surgeon: Annice Needy, MD;  Location: [facility];  Service: Cardiovascular;  Laterality: Right;   LYSIS OF ADHESION  01/25/2021   Procedure: LYSIS OF ADHESION;  Surgeon: Campbell Lerner, MD;  Location: [facility];  Service: General;;   PERIPHERAL VASCULAR INTERVENTION  03/08/2018   Procedure: PERIPHERAL VASCULAR INTERVENTION;  Surgeon: Annice Needy, MD;  Location: [facility];  Service: Cardiovascular;;   PORTA CATH INSERTION N/A 02/17/2020   Procedure: PORTA CATH INSERTION;  Surgeon: Annice Needy, MD;  Location: [facility];  Service: Cardiovascular;  Laterality: N/A;   REDUCTION MAMMAPLASTY  1997   SACROPLASTY N/A 10/25/2018   Procedure: S1 SACROPLASTY;  Surgeon: Kennedy Bucker, MD;  Location: [facility];  Service: Orthopedics;  Laterality: N/A;   FAMILY HISTORY Family History  Problem Relation Age of Onset   Coronary artery disease Father    Heart attack Father    Coronary artery disease Mother    Heart attack Mother    Ovarian cancer Sister 79       sister had hormonal therapy for IVF txs-which increased risk factor for ovarian cancer   Breast cancer Neg Hx    SOCIAL HISTORY Social History   Tobacco Use   Smoking status: Former    Current packs/day: 0.00    Average packs/day: 1 pack/day for 20.0 years (20.0 ttl pk-yrs)    Types: Cigarettes    Start date: 03/07/1976    Quit date: 03/07/1996    Years since quitting: 27.0    Passive exposure: Past   Smokeless tobacco: Never   Tobacco comments:    started smoking at age 53 but stopped smoking in 2000  Vaping Use   Vaping status: Never Used  Substance Use Topics   Alcohol use: No    Alcohol/week: 0.0 standard  drinks of alcohol   Drug use: No       OPHTHALMIC EXAM: Base Eye Exam     Visual Acuity (Snellen - Linear)       Right Left  Dist [place] 20/30 -2 20/20 -2   Dist ph [place] NI          Tonometry (Tonopen, 2:16 PM)       Right Left   Pressure 21 16         Pupils       Dark Light Shape React APD   Right 3 2 Round Brisk None   Left 3 2 Round Brisk None         Visual Fields (Counting fingers)       Left Right    Full Full         Extraocular Movement       Right Left    Full, Ortho Full, Ortho         Neuro/Psych     Oriented x3: Yes   Mood/Affect: Normal         Dilation     Both eyes: 1.0% Mydriacyl, 2.5% Phenylephrine @ 2:16 PM           Slit Lamp and Fundus Exam     External Exam       Right Left   External Normal Normal         Slit Lamp Exam       Right Left   Lids/Lashes dermatochalasis dermatochalasis   Conjunctiva/Sclera White and quiet Trace Injection   Cornea arcus; well healed cataract wound; 2-3+ diffuse Punctate epithelial erosions, decreased TBUT, mild Anterior basement membrane dystrophy superiorly arcus; well healed cataract wound, 1-2+ Punctate epithelial erosions, tear film debris   Anterior Chamber Deep and quiet Deep and quiet   Iris Round and dilated Round and dilated   Lens PCIOL; open PC PCIOL; open PC   Anterior Vitreous syneresis, Posterior vitreous detachment, vitreous condensations inferiorly syneresis, Posterior vitreous detachment         Fundus Exam       Right Left   Disc Superior hyperemia -- stably improved, mild Pallor Pink and Sharp   C/D Ratio 0.6 0.5   Macula Flat, Blunted foveal reflex, +cystic changes -- improved, +Epiretinal membrane, minimal MA flat; good foveal reflex, no heme or edema, small pigment clump IT to fovea   Vessels attenuated, Tortuous attenuated, Tortuous   Periphery Attached; scattered DBH -- greastest temporal periphery- improved Attached, no heme           IMAGING  AND PROCEDURES  Imaging and Procedures for 04/25/17  OCT, Retina - OU - Both Eyes       Right Eye Quality was good. Central Foveal Thickness: 327. Progression has improved. Findings include no IRF, no SRF, abnormal foveal contour, epiretinal membrane (Interval improvement in IRF / cystic changes temporal fovea and macula ).   Left Eye Quality was good. Central Foveal Thickness: 290. Progression has been stable. Findings include normal foveal contour, no IRF, no SRF.   Notes *Images captured and stored on drive  Diagnosis / Impression:  OD: Interval improvement in IRF / cystic changes temporal fovea and macula  OS: NFP; no IRF/SRF--stable  Clinical management:  See below  Abbreviations: NFP - Normal foveal profile. CME - cystoid macular edema. PED - pigment epithelial detachment. IRF - intraretinal fluid. SRF - subretinal fluid. EZ - ellipsoid zone. ERM - epiretinal membrane. ORA - outer retinal atrophy. ORT - outer retinal tubulation. SRHM - subretinal hyper-reflective material      Intravitreal Injection, Pharmacologic Agent - OD - Right Eye       Time Out 03/28/2023. 3:09 PM. Confirmed correct  patient, procedure, site, and patient consented.   Anesthesia Topical anesthesia was used. Anesthetic medications included Lidocaine 2%, Proparacaine 0.5%.   Procedure Preparation included 5% betadine to ocular surface, eyelid speculum. A (30 g) needle was used.   Injection: 8 mg aflibercept 8 MG/0.07ML   Route: Intravitreal, Site: Right Eye   NDC: [id number], Lot: [id number], Expiration date: 01/03/2024, Waste: 0 mL   Post-op Post injection exam found visual acuity of at least counting fingers. The patient tolerated the procedure well. There were no complications. The patient received written and verbal post procedure care education. Post injection medications were not given.            ASSESSMENT/PLAN:    ICD-10-CM   1. Branch retinal vein occlusion of right eye with  macular edema  H34.8310 OCT, Retina - OU - Both Eyes    2. Both eyes affected by mild nonproliferative diabetic retinopathy with macular edema, associated with type 2 diabetes mellitus (HCC)  W29.5621 Intravitreal Injection, Pharmacologic Agent - OD - Right Eye    aflibercept (EYLEA HD) ophthalmic injection 8 mg    3. Long term (current) use of oral hypoglycemic drugs  Z79.84     4. Current use of insulin (HCC)  Z79.4     5. Essential hypertension  I10     6. Hypertensive retinopathy of both eyes  H35.033     7. Epiretinal membrane (ERM) of right eye  H35.371     8. Pseudophakia of both eyes  Z96.1      1. BRVO w/ CME OD - by history, pt states symptoms first noticed 2 wks prior to presentation, but reports changes may have occurred prior  - initial exam with differential tortuosity of vessels (OD > OS) - FA (02.10.20) shows mild late staining / leakage in macula, staining / leakage of disc -- improving CME - differential includes DM2 (DME), hypertensive retinopathy, inflammatory etiology / uveitis - S/P IVA OD #1 (02.08.19), #2 (03.11.19), #3 (04.09.19), #4 (05.20.19), #5 (02.10.20) - gave IVA OD on 2.10.20 due to pending Eylea4U for 2020 -- resulted in increased IRF/CME - review of OCT show persistent IRF and cystic changes --  resistance to IVA  - June 2019 -- switched therapies:  ============================== - S/P IVE OD #1 (06.24.19), #2 (07.24.19), #3 (09.04.19), #4 (10.30.19),#5 (12.30.19), #6 (03.23.20), #7 (05.05.20), #8 (07.16.20), #9 (07.17.20), #10 (08.28.20), #11 (10.13.20), # 12 (11.17.20), #13 (2.8.21), #14 (03.09.21), #15 (04.13.21), #16 (05.11.21), #17 (06.17.21), #18 (07.23.21), #19 (08.30.21), #20 (10.04.21), #21 (11.08.21), #22 (12.08.21), #23 (01.31.22), #24 (02.28.22), #25 (04.01.22), #26 (06.15.22), #27 (07.13.22), #28 (08.17.22), #29 (09.21.22), #30 (10.19.22), #31 (11.16.22), #32 (12.16.22), #33 (01.13.23), #34 (02.10.23), #35 (03.15.23), #36 (04.14.23), #37  (05.15.23), #38 (06.12.23), #39 (07.10.23), #40 (08.07.23), #41 (09.06.23), #42 (10.04.23), #43 (11.01.23) -- IVE resistance ================================ **interval increase in IRF at 5 weeks on 07.01.24 and at 6 wks on 02.28.25 (IVV)** - s/p IVV OD #1 (12.04.23), #2 (01.03.24), #3 (01.31.24), #4 (02.28.24), #5 (03.29.24), #6 (04.24.24), #7 (05.28.24), #8 (07.01.24), #9 (07.29.24), #10 (08.26.24), #11 (09.26.24), #12 (10.25.24), #13 (11.25.24), #14 (01.07.24), #15 (02.18.25) - OCT today shows interval improvement in IRF / cystic changes temporal fovea and macula at 5 weeks **discussed decreased efficacy / resistance to Vabysmo and potential benefit of switching from Vabysmo to Eylea HD*  - BCVA OD stable at 20/30  - IVE HD as below  - f/u 5 weeks  -- DFE/OCT/possible injection  2-4. Mild nonproliferative diabetic retinopathy, both eyes - A1c 7.5 on 02.03.25  -  there could be a DME component contributing to CME OD  - OD shows interval improvement in central IRF and pt reports poor BG control since last visit  - OS with minimal diabetic retinopathy  **discussed decreased efficacy / resistance to Vabysmo and potential benefit of switching medication**   - recommend switching to IVE HD OD #1 today, 03.25.25 w/ f/u at 5 weeks again  - RBA of procedure discussed, questions answered  - see procedure note   - Eylea HD informed consent form obtained, signed and scanned on 03.25.25  - pt approved for Eylea HD through Medicare and supplement  - monitor OS  - f/u 5 weeks, DFE, OCT  5,6. Hypertensive retinopathy OU - stable  - as above, may have contributing to CME OD  - discussed importance of tight BP control  - monitor  7. Epiretinal membrane, right eye   - stable nasal ERM  - no indication for surgery at this time  8. Pseudophakia OU  - s/p CE/IOL OU by cataract surgeon in [facility]  - doing well  - monitor  Ophthalmic Meds Ordered this visit:  Meds ordered this encounter  Medications    aflibercept (EYLEA HD) ophthalmic injection 8 mg     Return in about 5 weeks (around 05/02/2023) for f/u BRVO and DME OD, DFE, OCT, Possible Injxn.   This document serves as a record of services personally performed by Karie Chimera, MD, PhD. It was created on their behalf by Charlette Caffey, COT an ophthalmic technician. The creation of this record is the provider's dictation and/or activities during the visit.    Electronically signed by:  Charlette Caffey, COT  03/28/23 4:16 PM  This document serves as a record of services personally performed by Karie Chimera, MD, PhD. It was created on their behalf by Glee Arvin. Manson Passey, OA an ophthalmic technician. The creation of this record is the provider's dictation and/or activities during the visit.    Electronically signed by: Glee Arvin. Manson Passey, OA 03/28/23 4:16 PM  Karie Chimera, M.D., Ph.D. Diseases & Surgery of the Retina and Vitreous [facility]  I have reviewed the above documentation for accuracy and completeness, and I agree with the above. Karie Chimera, M.D., Ph.D. 03/28/23 4:16 PM   Abbreviations: M myopia (nearsighted); A astigmatism; H hyperopia (farsighted); P presbyopia; Mrx spectacle prescription;  CTL contact lenses; OD right eye; OS left eye; OU both eyes  XT exotropia; ET esotropia; PEK punctate epithelial keratitis; PEE punctate epithelial erosions; DES dry eye syndrome; MGD meibomian gland dysfunction; ATs artificial tears; PFAT's preservative free artificial tears; NSC nuclear sclerotic cataract; PSC posterior subcapsular cataract; ERM epi-retinal membrane; PVD posterior vitreous detachment; RD retinal detachment; DM diabetes mellitus; DR diabetic retinopathy; NPDR non-proliferative diabetic retinopathy; PDR proliferative diabetic retinopathy; CSME clinically significant macular edema; DME diabetic macular edema; dbh dot blot hemorrhages; CWS cotton wool spot; POAG primary open angle glaucoma; C/D  cup-to-disc ratio; HVF humphrey visual field; GVF goldmann visual field; OCT optical coherence tomography; IOP intraocular pressure; BRVO Branch retinal vein occlusion; CRVO central retinal vein occlusion; CRAO central retinal artery occlusion; BRAO branch retinal artery occlusion; RT retinal tear; SB scleral buckle; PPV pars plana vitrectomy; VH Vitreous hemorrhage; PRP panretinal laser photocoagulation; IVK intravitreal kenalog; VMT vitreomacular traction; MH Macular hole;  NVD neovascularization of the disc; NVE neovascularization elsewhere; AREDS age related eye disease study; ARMD age related macular degeneration; POAG primary open angle glaucoma; EBMD epithelial/anterior basement membrane  dystrophy; ACIOL anterior chamber intraocular lens; IOL intraocular lens; PCIOL posterior chamber intraocular lens; Phaco/IOL phacoemulsification with intraocular lens placement; PRK photorefractive keratectomy; LASIK laser assisted in situ keratomileusis; HTN hypertension; DM diabetes mellitus; COPD chronic obstructive pulmonary disease

## 2023-03-17 ENCOUNTER — Ambulatory Visit (INDEPENDENT_AMBULATORY_CARE_PROVIDER_SITE_OTHER): Payer: Medicare Other | Admitting: Vascular Surgery

## 2023-03-17 ENCOUNTER — Ambulatory Visit (INDEPENDENT_AMBULATORY_CARE_PROVIDER_SITE_OTHER): Payer: Medicare Other

## 2023-03-17 DIAGNOSIS — I70223 Atherosclerosis of native arteries of extremities with rest pain, bilateral legs: Secondary | ICD-10-CM

## 2023-03-23 LAB — VAS US ABI WITH/WO TBI
Left ABI: 1.01
Right ABI: 0.99

## 2023-03-28 ENCOUNTER — Encounter (INDEPENDENT_AMBULATORY_CARE_PROVIDER_SITE_OTHER): Payer: Self-pay | Admitting: Ophthalmology

## 2023-03-28 ENCOUNTER — Ambulatory Visit (INDEPENDENT_AMBULATORY_CARE_PROVIDER_SITE_OTHER): Payer: Medicare Other | Admitting: Ophthalmology

## 2023-03-28 DIAGNOSIS — E113213 Type 2 diabetes mellitus with mild nonproliferative diabetic retinopathy with macular edema, bilateral: Secondary | ICD-10-CM | POA: Diagnosis not present

## 2023-03-28 DIAGNOSIS — H34831 Tributary (branch) retinal vein occlusion, right eye, with macular edema: Secondary | ICD-10-CM | POA: Diagnosis not present

## 2023-03-28 DIAGNOSIS — Z794 Long term (current) use of insulin: Secondary | ICD-10-CM

## 2023-03-28 DIAGNOSIS — Z961 Presence of intraocular lens: Secondary | ICD-10-CM

## 2023-03-28 DIAGNOSIS — H35371 Puckering of macula, right eye: Secondary | ICD-10-CM

## 2023-03-28 DIAGNOSIS — Z7984 Long term (current) use of oral hypoglycemic drugs: Secondary | ICD-10-CM

## 2023-03-28 DIAGNOSIS — I10 Essential (primary) hypertension: Secondary | ICD-10-CM

## 2023-03-28 DIAGNOSIS — H35033 Hypertensive retinopathy, bilateral: Secondary | ICD-10-CM

## 2023-03-28 MED ORDER — AFLIBERCEPT 8 MG/0.07ML IZ SOLN
8.0000 mg | INTRAVITREAL | Status: AC | PRN
  Administered 2023-03-28: 8 mg via INTRAVITREAL

## 2023-04-07 ENCOUNTER — Emergency Department

## 2023-04-07 ENCOUNTER — Encounter: Payer: Self-pay | Admitting: Emergency Medicine

## 2023-04-07 ENCOUNTER — Inpatient Hospital Stay
Admission: EM | Admit: 2023-04-07 | Discharge: 2023-04-11 | DRG: 872 | Disposition: A | Discharge status: Home Health | Attending: Internal Medicine | Admitting: Internal Medicine

## 2023-04-07 ENCOUNTER — Other Ambulatory Visit: Payer: Self-pay

## 2023-04-07 DIAGNOSIS — F419 Anxiety disorder, unspecified: Secondary | ICD-10-CM | POA: Diagnosis present

## 2023-04-07 DIAGNOSIS — Z888 Allergy status to other drugs, medicaments and biological substances status: Secondary | ICD-10-CM

## 2023-04-07 DIAGNOSIS — Z1152 Encounter for screening for COVID-19: Secondary | ICD-10-CM | POA: Diagnosis not present

## 2023-04-07 DIAGNOSIS — M810 Age-related osteoporosis without current pathological fracture: Secondary | ICD-10-CM | POA: Diagnosis present

## 2023-04-07 DIAGNOSIS — E1169 Type 2 diabetes mellitus with other specified complication: Secondary | ICD-10-CM | POA: Diagnosis not present

## 2023-04-07 DIAGNOSIS — E1151 Type 2 diabetes mellitus with diabetic peripheral angiopathy without gangrene: Secondary | ICD-10-CM | POA: Diagnosis present

## 2023-04-07 DIAGNOSIS — Z90710 Acquired absence of both cervix and uterus: Secondary | ICD-10-CM

## 2023-04-07 DIAGNOSIS — Z66 Do not resuscitate: Secondary | ICD-10-CM | POA: Diagnosis present

## 2023-04-07 DIAGNOSIS — N390 Urinary tract infection, site not specified: Secondary | ICD-10-CM | POA: Diagnosis not present

## 2023-04-07 DIAGNOSIS — E785 Hyperlipidemia, unspecified: Secondary | ICD-10-CM | POA: Diagnosis present

## 2023-04-07 DIAGNOSIS — E119 Type 2 diabetes mellitus without complications: Secondary | ICD-10-CM | POA: Diagnosis not present

## 2023-04-07 DIAGNOSIS — R112 Nausea with vomiting, unspecified: Secondary | ICD-10-CM | POA: Diagnosis present

## 2023-04-07 DIAGNOSIS — E1122 Type 2 diabetes mellitus with diabetic chronic kidney disease: Secondary | ICD-10-CM | POA: Diagnosis present

## 2023-04-07 DIAGNOSIS — F330 Major depressive disorder, recurrent, mild: Secondary | ICD-10-CM | POA: Diagnosis not present

## 2023-04-07 DIAGNOSIS — E113293 Type 2 diabetes mellitus with mild nonproliferative diabetic retinopathy without macular edema, bilateral: Secondary | ICD-10-CM | POA: Diagnosis present

## 2023-04-07 DIAGNOSIS — Z89421 Acquired absence of other right toe(s): Secondary | ICD-10-CM

## 2023-04-07 DIAGNOSIS — Z79890 Hormone replacement therapy (postmenopausal): Secondary | ICD-10-CM | POA: Diagnosis not present

## 2023-04-07 DIAGNOSIS — H35033 Hypertensive retinopathy, bilateral: Secondary | ICD-10-CM | POA: Diagnosis present

## 2023-04-07 DIAGNOSIS — Z7901 Long term (current) use of anticoagulants: Secondary | ICD-10-CM | POA: Diagnosis not present

## 2023-04-07 DIAGNOSIS — E1129 Type 2 diabetes mellitus with other diabetic kidney complication: Secondary | ICD-10-CM | POA: Diagnosis not present

## 2023-04-07 DIAGNOSIS — I129 Hypertensive chronic kidney disease with stage 1 through stage 4 chronic kidney disease, or unspecified chronic kidney disease: Secondary | ICD-10-CM | POA: Diagnosis present

## 2023-04-07 DIAGNOSIS — B9620 Unspecified Escherichia coli [E. coli] as the cause of diseases classified elsewhere: Secondary | ICD-10-CM | POA: Diagnosis not present

## 2023-04-07 DIAGNOSIS — Z7984 Long term (current) use of oral hypoglycemic drugs: Secondary | ICD-10-CM

## 2023-04-07 DIAGNOSIS — N1832 Chronic kidney disease, stage 3b: Secondary | ICD-10-CM | POA: Diagnosis present

## 2023-04-07 DIAGNOSIS — I2510 Atherosclerotic heart disease of native coronary artery without angina pectoris: Secondary | ICD-10-CM | POA: Diagnosis present

## 2023-04-07 DIAGNOSIS — F32A Depression, unspecified: Secondary | ICD-10-CM | POA: Diagnosis not present

## 2023-04-07 DIAGNOSIS — E039 Hypothyroidism, unspecified: Secondary | ICD-10-CM | POA: Diagnosis present

## 2023-04-07 DIAGNOSIS — Z7982 Long term (current) use of aspirin: Secondary | ICD-10-CM

## 2023-04-07 DIAGNOSIS — Z79899 Other long term (current) drug therapy: Secondary | ICD-10-CM

## 2023-04-07 DIAGNOSIS — N12 Tubulo-interstitial nephritis, not specified as acute or chronic: Secondary | ICD-10-CM | POA: Diagnosis present

## 2023-04-07 DIAGNOSIS — Z89422 Acquired absence of other left toe(s): Secondary | ICD-10-CM | POA: Diagnosis not present

## 2023-04-07 DIAGNOSIS — Z8249 Family history of ischemic heart disease and other diseases of the circulatory system: Secondary | ICD-10-CM

## 2023-04-07 DIAGNOSIS — A4151 Sepsis due to Escherichia coli [E. coli]: Principal | ICD-10-CM | POA: Diagnosis present

## 2023-04-07 DIAGNOSIS — E1140 Type 2 diabetes mellitus with diabetic neuropathy, unspecified: Secondary | ICD-10-CM | POA: Diagnosis present

## 2023-04-07 DIAGNOSIS — K219 Gastro-esophageal reflux disease without esophagitis: Secondary | ICD-10-CM | POA: Diagnosis present

## 2023-04-07 DIAGNOSIS — Z794 Long term (current) use of insulin: Secondary | ICD-10-CM | POA: Diagnosis not present

## 2023-04-07 DIAGNOSIS — M1990 Unspecified osteoarthritis, unspecified site: Secondary | ICD-10-CM | POA: Diagnosis present

## 2023-04-07 DIAGNOSIS — Z87891 Personal history of nicotine dependence: Secondary | ICD-10-CM | POA: Diagnosis not present

## 2023-04-07 DIAGNOSIS — G4730 Sleep apnea, unspecified: Secondary | ICD-10-CM | POA: Diagnosis present

## 2023-04-07 DIAGNOSIS — A419 Sepsis, unspecified organism: Secondary | ICD-10-CM | POA: Diagnosis not present

## 2023-04-07 DIAGNOSIS — R7611 Nonspecific reaction to tuberculin skin test without active tuberculosis: Secondary | ICD-10-CM | POA: Diagnosis present

## 2023-04-07 LAB — GASTROINTESTINAL PANEL BY PCR, STOOL (REPLACES STOOL CULTURE)

## 2023-04-07 LAB — PROTIME-INR
INR: 1.4 — ABNORMAL HIGH (ref 0.8–1.2)
Prothrombin Time: 17 s — ABNORMAL HIGH (ref 11.4–15.2)

## 2023-04-07 LAB — COMPREHENSIVE METABOLIC PANEL WITH GFR
ALT: 16 U/L (ref 0–44)
AST: 17 U/L (ref 15–41)
Albumin: 3.2 g/dL — ABNORMAL LOW (ref 3.5–5.0)
Alkaline Phosphatase: 46 U/L (ref 38–126)
Anion gap: 12 (ref 5–15)
BUN: 38 mg/dL — ABNORMAL HIGH (ref 8–23)
CO2: 22 mmol/L (ref 22–32)
Calcium: 7.7 mg/dL — ABNORMAL LOW (ref 8.9–10.3)
Chloride: 99 mmol/L (ref 98–111)
Creatinine, Ser: 2.26 mg/dL — ABNORMAL HIGH (ref 0.44–1.00)
GFR, Estimated: 22 mL/min — ABNORMAL LOW (ref >60)
Glucose, Bld: 187 mg/dL — ABNORMAL HIGH (ref 70–99)
Potassium: 3.5 mmol/L (ref 3.5–5.1)
Sodium: 133 mmol/L — ABNORMAL LOW (ref 135–145)
Total Bilirubin: 1.1 mg/dL (ref 0.0–1.2)
Total Protein: 6.5 g/dL (ref 6.5–8.1)

## 2023-04-07 LAB — RESP PANEL BY RT-PCR (RSV, FLU A&B, COVID)  RVPGX2
Influenza A by PCR: NEGATIVE
Influenza B by PCR: NEGATIVE
Resp Syncytial Virus by PCR: NEGATIVE
SARS Coronavirus 2 by RT PCR: NEGATIVE

## 2023-04-07 LAB — URINALYSIS, ROUTINE W REFLEX MICROSCOPIC
Bacteria, UA: NONE SEEN
Bilirubin Urine: NEGATIVE
Glucose, UA: >=500 mg/dL — AB
Ketones, ur: NEGATIVE mg/dL
Nitrite: NEGATIVE
Protein, ur: 100 mg/dL — AB
Specific Gravity, Urine: 1.013 (ref 1.005–1.030)
WBC, UA: >50 WBC/hpf (ref 0–5)
pH: 7 (ref 5.0–8.0)

## 2023-04-07 LAB — CBC WITH DIFFERENTIAL/PLATELET
Abs Immature Granulocytes: 0.04 10*3/uL (ref 0.00–0.07)
Basophils Absolute: 0 10*3/uL (ref 0.0–0.1)
Basophils Relative: 0 %
Eosinophils Absolute: 0 10*3/uL (ref 0.0–0.5)
Eosinophils Relative: 1 %
HCT: 27.8 % — ABNORMAL LOW (ref 36.0–46.0)
Hemoglobin: 9.2 g/dL — ABNORMAL LOW (ref 12.0–15.0)
Immature Granulocytes: 1 %
Lymphocytes Relative: 6 %
Lymphs Abs: 0.5 10*3/uL — ABNORMAL LOW (ref 0.7–4.0)
MCH: 29.5 pg (ref 26.0–34.0)
MCHC: 33.1 g/dL (ref 30.0–36.0)
MCV: 89.1 fL (ref 80.0–100.0)
Monocytes Absolute: 1.1 10*3/uL — ABNORMAL HIGH (ref 0.1–1.0)
Monocytes Relative: 14 %
Neutro Abs: 6.3 10*3/uL (ref 1.7–7.7)
Neutrophils Relative %: 78 %
Platelets: 241 10*3/uL (ref 150–400)
RBC: 3.12 MIL/uL — ABNORMAL LOW (ref 3.87–5.11)
RDW: 14.3 % (ref 11.5–15.5)
WBC: 8 10*3/uL (ref 4.0–10.5)
nRBC: 0 % (ref 0.0–0.2)

## 2023-04-07 LAB — LIPASE, BLOOD: Lipase: 29 U/L (ref 11–51)

## 2023-04-07 LAB — C DIFFICILE QUICK SCREEN W PCR REFLEX
C Diff antigen: NEGATIVE
C Diff interpretation: NOT DETECTED
C Diff toxin: NEGATIVE

## 2023-04-07 LAB — CBG MONITORING, ED
Glucose-Capillary: 231 mg/dL — ABNORMAL HIGH (ref 70–99)
Glucose-Capillary: 261 mg/dL — ABNORMAL HIGH (ref 70–99)
Glucose-Capillary: 211 mg/dL — ABNORMAL HIGH (ref 70–99)

## 2023-04-07 LAB — GLUCOSE, CAPILLARY: Glucose-Capillary: 251 mg/dL — ABNORMAL HIGH (ref 70–99)

## 2023-04-07 LAB — LACTIC ACID, PLASMA: Lactic Acid, Venous: 1 mmol/L (ref 0.5–1.9)

## 2023-04-07 MED ORDER — ASPIRIN 81 MG PO TBEC
81.0000 mg | DELAYED_RELEASE_TABLET | Freq: Every day | ORAL | Status: DC
  Administered 2023-04-08 – 2023-04-11 (×4): 81 mg via ORAL
  Filled 2023-04-07 (×4): qty 1

## 2023-04-07 MED ORDER — ESCITALOPRAM OXALATE 10 MG PO TABS
10.0000 mg | ORAL_TABLET | Freq: Every day | ORAL | Status: DC
Start: 2023-04-08 — End: 2023-04-11
  Administered 2023-04-08 – 2023-04-11 (×4): 10 mg via ORAL
  Filled 2023-04-07 (×4): qty 1

## 2023-04-07 MED ORDER — FAMOTIDINE 20 MG PO TABS
40.0000 mg | ORAL_TABLET | Freq: Every day | ORAL | Status: DC
  Administered 2023-04-08 – 2023-04-11 (×4): 40 mg via ORAL
  Filled 2023-04-07 (×4): qty 2

## 2023-04-07 MED ORDER — INSULIN ASPART 100 UNIT/ML IJ SOLN
0.0000 [IU] | Freq: Three times a day (TID) | INTRAMUSCULAR | Status: DC
  Administered 2023-04-07: 5 [IU] via SUBCUTANEOUS
  Administered 2023-04-07: 3 [IU] via SUBCUTANEOUS
  Administered 2023-04-08: 2 [IU] via SUBCUTANEOUS
  Administered 2023-04-08: 5 [IU] via SUBCUTANEOUS
  Filled 2023-04-07 (×4): qty 1

## 2023-04-07 MED ORDER — OLANZAPINE 10 MG IM SOLR
2.5000 mg | Freq: Four times a day (QID) | INTRAMUSCULAR | Status: DC | PRN
  Administered 2023-04-07 – 2023-04-08 (×2): 2.5 mg via INTRAMUSCULAR
  Filled 2023-04-07 (×3): qty 10

## 2023-04-07 MED ORDER — ONDANSETRON HCL 4 MG/2ML IJ SOLN
4.0000 mg | Freq: Four times a day (QID) | INTRAMUSCULAR | Status: DC | PRN
  Administered 2023-04-07 – 2023-04-09 (×2): 4 mg via INTRAVENOUS
  Filled 2023-04-07 (×2): qty 2

## 2023-04-07 MED ORDER — RIVAROXABAN 20 MG PO TABS
20.0000 mg | ORAL_TABLET | Freq: Every day | ORAL | Status: DC
  Administered 2023-04-07: 20 mg via ORAL
  Filled 2023-04-07 (×2): qty 1

## 2023-04-07 MED ORDER — ZOLPIDEM TARTRATE 5 MG PO TABS
5.0000 mg | ORAL_TABLET | Freq: Every day | ORAL | Status: DC
Start: 2023-04-07 — End: 2023-04-11
  Administered 2023-04-07 – 2023-04-10 (×4): 5 mg via ORAL
  Filled 2023-04-07 (×4): qty 1

## 2023-04-07 MED ORDER — ALPRAZOLAM 0.25 MG PO TABS
0.2500 mg | ORAL_TABLET | Freq: Every day | ORAL | Status: DC | PRN
  Administered 2023-04-09 – 2023-04-11 (×2): 0.25 mg via ORAL
  Filled 2023-04-07 (×2): qty 1

## 2023-04-07 MED ORDER — ACETAMINOPHEN 500 MG PO TABS
1000.0000 mg | ORAL_TABLET | ORAL | Status: AC
  Administered 2023-04-07: 1000 mg via ORAL
  Filled 2023-04-07: qty 2

## 2023-04-07 MED ORDER — ONDANSETRON HCL 4 MG/2ML IJ SOLN
4.0000 mg | INTRAMUSCULAR | Status: AC
  Administered 2023-04-07: 4 mg via INTRAVENOUS
  Filled 2023-04-07: qty 2

## 2023-04-07 MED ORDER — SODIUM CHLORIDE 0.9 % IV SOLN
1.0000 g | INTRAVENOUS | Status: AC
  Administered 2023-04-07: 1 g via INTRAVENOUS
  Filled 2023-04-07: qty 10

## 2023-04-07 MED ORDER — ACETAMINOPHEN 325 MG PO TABS
650.0000 mg | ORAL_TABLET | Freq: Four times a day (QID) | ORAL | Status: DC | PRN
  Administered 2023-04-07 – 2023-04-08 (×3): 650 mg via ORAL
  Filled 2023-04-07 (×3): qty 2

## 2023-04-07 MED ORDER — SODIUM BICARBONATE 650 MG PO TABS
650.0000 mg | ORAL_TABLET | Freq: Two times a day (BID) | ORAL | Status: DC
  Administered 2023-04-07 – 2023-04-11 (×8): 650 mg via ORAL
  Filled 2023-04-07 (×8): qty 1

## 2023-04-07 MED ORDER — HYDRALAZINE HCL 20 MG/ML IJ SOLN
5.0000 mg | Freq: Four times a day (QID) | INTRAMUSCULAR | Status: DC | PRN
  Administered 2023-04-11: 5 mg via INTRAVENOUS
  Filled 2023-04-07: qty 1

## 2023-04-07 MED ORDER — SODIUM CHLORIDE 0.9 % IV BOLUS
1000.0000 mL | Freq: Once | INTRAVENOUS | Status: AC
  Administered 2023-04-07: 1000 mL via INTRAVENOUS

## 2023-04-07 MED ORDER — SODIUM CHLORIDE 0.9 % IV BOLUS
500.0000 mL | Freq: Once | INTRAVENOUS | Status: AC
  Administered 2023-04-07: 500 mL via INTRAVENOUS

## 2023-04-07 MED ORDER — ROSUVASTATIN CALCIUM 10 MG PO TABS
20.0000 mg | ORAL_TABLET | Freq: Every day | ORAL | Status: DC
  Administered 2023-04-08 – 2023-04-11 (×4): 20 mg via ORAL
  Filled 2023-04-07 (×4): qty 2

## 2023-04-07 MED ORDER — LEVOTHYROXINE SODIUM 100 MCG PO TABS
100.0000 ug | ORAL_TABLET | Freq: Every day | ORAL | Status: DC
  Administered 2023-04-08 – 2023-04-11 (×4): 100 ug via ORAL
  Filled 2023-04-07 (×4): qty 1

## 2023-04-07 MED ORDER — RISAQUAD PO CAPS
2.0000 | ORAL_CAPSULE | Freq: Three times a day (TID) | ORAL | Status: DC
  Administered 2023-04-07 – 2023-04-11 (×13): 2 via ORAL
  Filled 2023-04-07 (×13): qty 2

## 2023-04-07 MED ORDER — SODIUM CHLORIDE 0.9 % IV SOLN
2.0000 g | INTRAVENOUS | Status: DC
  Administered 2023-04-08 – 2023-04-10 (×3): 2 g via INTRAVENOUS
  Filled 2023-04-07 (×3): qty 20

## 2023-04-07 MED ORDER — GABAPENTIN 300 MG PO CAPS
300.0000 mg | ORAL_CAPSULE | Freq: Every evening | ORAL | Status: DC | PRN
  Administered 2023-04-07: 300 mg via ORAL
  Filled 2023-04-07: qty 1

## 2023-04-07 MED ORDER — PANTOPRAZOLE SODIUM 40 MG PO TBEC
40.0000 mg | DELAYED_RELEASE_TABLET | Freq: Every day | ORAL | Status: DC
  Administered 2023-04-07 – 2023-04-11 (×5): 40 mg via ORAL
  Filled 2023-04-07 (×5): qty 1

## 2023-04-07 NOTE — ED Notes (Signed)
 Patient intermittently desats while sleeping. Patient placed on 2L . MD Quale notified.

## 2023-04-07 NOTE — Sepsis Progress Note (Signed)
 eLink is following this Code Sepsis.

## 2023-04-07 NOTE — ED Triage Notes (Signed)
 Pt to triage via w/c with no distress noted; st for last 2 days having N/V/D and lower abd pain; denies hx of same

## 2023-04-07 NOTE — H&P (Signed)
 History and Physical    Cheryl Leonard XBM:[id number] DOB: Nov 16, 1945 DOA: 04/07/2023  PCP: Danella Penton, MD (Confirm with patient/family/NH records and if not entered, this has to be entered at [facility] point of entry) Patient coming from: Home  I have personally briefly reviewed patient's old medical records in [facility]  Chief Complaint: Nauseous vomiting diarrhea and fever  HPI: Cheryl Leonard is a 78 y.o. female with medical history significant of CKD stage IIIb, IIDM, diabetic neuropathy, HTN, PVD on aspirin Xarelto and statin, hypothyroidism, presented with nausea vomiting diarrhea and fever.  Symptoms started 2 days ago patient started develop feeling of nausea and vomited several times of stomach content nonbloody none bile but she denied any abdominal pain.  Followed by loose bowel movement " mushy" 3-4 times a day for last 2 days.  Spiking fever 102 this morning and with new onset of right-sided flank and back pain, decided to come to ED.  Denies any dysuria, no chest pain no cough.  ED Course: Temperature 103.2, blood pressure 110/52, O2 saturation 98% on room air.  Blood work showed WBC 8.0, BUN 38 creatinine 2.2 compared to baseline 1.4-2.1 glucose 187 bicarb 22K 3.5.  CT abdominal pelvis without contrast showed inflammation of right-sided collecting system indication for inflammation versus infection.  UA showed 3+ WBC, 1+ RBC.  Patient was started on ceftriaxone and a total 1.5 L IV bolus in the ED.  Review of Systems: As per HPI otherwise 14 point review of systems negative.    Past Medical History:  Diagnosis Date   Anemia    Anxiety    Arthritis    Gout   Cataracts, both eyes    Diabetic retinopathy (HCC)    NPDR OU   Diverticulitis    GERD (gastroesophageal reflux disease)    Gout    Headache    h/o migraines   History of fracture of patella    right knee   History of positive PPD    Patient always shows positive   Hyperlipidemia     Hypertension    Hypertensive retinopathy    OU   Hypothyroidism    Lichen sclerosus 12/30/2013   of vulva   Metatarsal fracture    Neuropathy    Osteopenia    Peripheral vascular disease (HCC)    Polyneuropathy    numbness and tingling in feet and toes   Renal insufficiency    Stage 3   Sleep apnea    does not use cpap-lost weight    Type 2 diabetes mellitus, uncontrolled     Past Surgical History:  Procedure Laterality Date   ABDOMINAL HYSTERECTOMY     AMPUTATION TOE Right 05/08/2020   Procedure: AMPUTATION TOE-Right 4th Toe;  Surgeon: Rosetta Posner, DPM;  Location: [facility];  Service: Podiatry;  Laterality: Right;   AMPUTATION TOE Left 04/22/2021   Procedure: AMPUTATION TOE - 4TH METARSOPHANGEAL JOINT;  Surgeon: Rosetta Posner, DPM;  Location: [facility];  Service: Podiatry;  Laterality: Left;   APPENDECTOMY     BREAST REDUCTION SURGERY  2001   CATARACT EXTRACTION     CESAREAN SECTION  1976   COLONOSCOPY  03/05/2013   Nml - due for repeat 03/06/2018   COLONOSCOPY WITH PROPOFOL N/A 03/18/2019   Procedure: COLONOSCOPY WITH PROPOFOL;  Surgeon: Toledo, [name], MD;  Location: [facility];  Service: Gastroenterology;  Laterality: N/A;   DIAGNOSTIC LAPAROSCOPY     DILATION AND CURETTAGE OF UTERUS  1989   ENDARTERECTOMY  FEMORAL Bilateral 03/09/2018   Procedure: ENDARTERECTOMY FEMORAL;  Surgeon: Annice Needy, MD;  Location: [facility];  Service: Vascular;  Laterality: Bilateral;   ENDARTERECTOMY POPLITEAL Left 03/09/2018   Procedure: ENDARTERECTOMY POPLITEAL AND SFA;  Surgeon: Annice Needy, MD;  Location: [facility];  Service: Vascular;  Laterality: Left;   ESOPHAGOGASTRODUODENOSCOPY  03/05/2013   ESOPHAGOGASTRODUODENOSCOPY (EGD) WITH PROPOFOL N/A 03/18/2019   Procedure: ESOPHAGOGASTRODUODENOSCOPY (EGD) WITH PROPOFOL;  Surgeon: Toledo, [name], MD;  Location: [facility];  Service: Gastroenterology;  Laterality: N/A;   EYE SURGERY     Eyelid Surgery  2012   INTRAMEDULLARY (IM) NAIL  INTERTROCHANTERIC Left 10/30/2015   Procedure: INTRAMEDULLARY (IM) NAIL INTERTROCHANTRIC ;  Surgeon: Kennedy Bucker, MD;  Location: [facility];  Service: Orthopedics;  Laterality: Left;   KYPHOPLASTY N/A 10/25/2018   Procedure: L4 KYPHOPLASTY;  Surgeon: Kennedy Bucker, MD;  Location: [facility];  Service: Orthopedics;  Laterality: N/A;   LAPAROSCOPIC HYSTERECTOMY  2000   total   LOWER EXTREMITY ANGIOGRAPHY Left 03/08/2017   Procedure: LOWER EXTREMITY ANGIOGRAPHY;  Surgeon: Annice Needy, MD;  Location: [facility];  Service: Cardiovascular;  Laterality: Left;   LOWER EXTREMITY ANGIOGRAPHY Left 10/30/2017   Procedure: LOWER EXTREMITY ANGIOGRAPHY;  Surgeon: Annice Needy, MD;  Location: [facility];  Service: Cardiovascular;  Laterality: Left;   LOWER EXTREMITY ANGIOGRAPHY Right 03/08/2018   Procedure: LOWER EXTREMITY ANGIOGRAPHY;  Surgeon: Annice Needy, MD;  Location: [facility];  Service: Cardiovascular;  Laterality: Right;   LOWER EXTREMITY ANGIOGRAPHY Left 10/01/2018   Procedure: LOWER EXTREMITY ANGIOGRAPHY;  Surgeon: Annice Needy, MD;  Location: [facility];  Service: Cardiovascular;  Laterality: Left;   LOWER EXTREMITY ANGIOGRAPHY Right 10/08/2018   Procedure: LOWER EXTREMITY ANGIOGRAPHY;  Surgeon: Annice Needy, MD;  Location: [facility];  Service: Cardiovascular;  Laterality: Right;   LOWER EXTREMITY ANGIOGRAPHY Right 05/07/2020   Procedure: Lower Extremity Angiography;  Surgeon: Annice Needy, MD;  Location: [facility];  Service: Cardiovascular;  Laterality: Right;   LYSIS OF ADHESION  01/25/2021   Procedure: LYSIS OF ADHESION;  Surgeon: Campbell Lerner, MD;  Location: [facility];  Service: General;;   PERIPHERAL VASCULAR INTERVENTION  03/08/2018   Procedure: PERIPHERAL VASCULAR INTERVENTION;  Surgeon: Annice Needy, MD;  Location: [facility];  Service: Cardiovascular;;   PORTA CATH INSERTION N/A 02/17/2020   Procedure: PORTA CATH INSERTION;   Surgeon: Annice Needy, MD;  Location: [facility];  Service: Cardiovascular;  Laterality: N/A;   REDUCTION MAMMAPLASTY  1997   SACROPLASTY N/A 10/25/2018   Procedure: S1 SACROPLASTY;  Surgeon: Kennedy Bucker, MD;  Location: [facility];  Service: Orthopedics;  Laterality: N/A;     reports that she quit smoking about 27 years ago. Her smoking use included cigarettes. She started smoking about 47 years ago. She has a 20 pack-year smoking history. She has been exposed to tobacco smoke. She has never used smokeless tobacco. She reports that she does not drink alcohol and does not use drugs.  Allergies  Allergen Reactions   Ace Inhibitors Other (See Comments)    Unknown  Other Reaction(s): Intolerance    Family History  Problem Relation Age of Onset   Coronary artery disease Father    Heart attack Father    Coronary artery disease Mother    Heart attack Mother    Ovarian cancer Sister 7       sister had hormonal therapy  for IVF txs-which increased risk factor for ovarian cancer   Breast cancer Neg Hx      Prior to Admission medications   Medication Sig Start Date End Date Taking? Authorizing Provider  ALPRAZolam Prudy Feeler) 0.25 MG tablet Take 0.25 mg by mouth daily as needed for anxiety. 05/07/18  Yes [provider]  amLODipine (NORVASC) 5 MG tablet Take 5 mg by mouth 2 (two) times daily.   Yes [provider]  aspirin EC 81 MG tablet Take 1 tablet (81 mg total) by mouth daily. 04/23/21  Yes Rosetta Posner, DPM  cholecalciferol (VITAMIN D) 1000 units tablet Take 1,000 Units by mouth 2 (two) times daily.   Yes [provider]  dapagliflozin propanediol (FARXIGA) 5 MG TABS tablet Take 5 mg by mouth daily.   Yes [provider]  denosumab (PROLIA) 60 MG/ML SOLN injection Inject 60 mg into the skin every 6 (six) months.  03/15/17  Yes [provider]  escitalopram (LEXAPRO) 10 MG tablet Take 10 mg by mouth daily. 07/29/20  Yes [provider]  estradiol (ESTRACE) 0.1 MG/GM vaginal cream Place 1 Applicatorful vaginally daily as needed (vaginal irritation).   Yes [provider]  famotidine (PEPCID) 40 MG tablet Take 40 mg by mouth daily. 09/30/18  Yes [provider]  furosemide (LASIX) 20 MG tablet Take 20 mg by mouth every Wednesday. 05/07/18 09/04/26 Yes [provider]  gabapentin (NEURONTIN) 300 MG capsule Take 300 mg by mouth at bedtime as needed (pain). 01/19/21  Yes [provider]  levothyroxine (SYNTHROID, LEVOTHROID) 100 MCG tablet Take 100 mcg by mouth daily before breakfast.  08/03/14  Yes [provider]  lidocaine-prilocaine (EMLA) cream Apply 1 application topically as needed (apply prior to port a cath access). 04/08/20  Yes Earna Coder, MD  Magnesium 500 MG TABS Take 500 mg by mouth every morning.    Yes [provider]  mirtazapine (REMERON) 15 MG tablet Take 15 mg by mouth as needed.   Yes [provider]  Multiple Vitamin (MULTIVITAMIN WITH MINERALS) TABS tablet Take 1 tablet by mouth daily. Centrum Silver   Yes [provider]  nystatin cream (MYCOSTATIN) Apply 1 application topically daily as needed (Yeast infection). 06/03/20  Yes [provider]  olmesartan (BENICAR) 20 MG tablet Take 20 mg by mouth daily.   Yes [provider]  pantoprazole (PROTONIX) 40 MG tablet Take 40 mg by mouth daily. 07/25/21  Yes [provider]  rivaroxaban (XARELTO) 20 MG TABS tablet Take 1 tablet (20 mg total) by mouth daily with supper. 04/23/21  Yes Rosetta Posner, DPM  rosuvastatin (CRESTOR) 20 MG tablet Take 20 mg by mouth every morning. 03/19/18 09/04/26 Yes [provider]  sodium bicarbonate 650 MG tablet Take 650 mg by mouth 2 (two) times daily.   Yes [provider]  TRESIBA FLEXTOUCH 200 UNIT/ML SOPN Inject 25-30 Units as directed at bedtime. Titrate according to fasting blood glucose not to exceed 50  units a day 11/15/16  Yes [provider]  vitamin B-12 (CYANOCOBALAMIN) 1000 MCG tablet Take 1,000 mcg by mouth daily.   Yes [provider]  vitamin C (ASCORBIC ACID) 250 MG tablet Take 250 mg by mouth daily.   Yes [provider]  vitamin E 400 UNIT capsule Take 400 Units by mouth daily.   Yes [provider]  zolpidem (AMBIEN) 10 MG tablet Take 10 mg by mouth at bedtime. 06/06/20  Yes [provider]  HYDROcodone-acetaminophen (NORCO/VICODIN) 5-325 MG tablet Take 1 tablet by mouth daily. Patient not taking: Reported on 04/07/2023    [provider]  metoprolol succinate (TOPROL-XL) 50 MG 24 hr tablet Take 50 mg by mouth daily. Take with or immediately following a meal. Patient not taking: Reported on 04/07/2023    [provider]  NOVOLOG FLEXPEN 100 UNIT/ML FlexPen SMARTSIG:8 Unit(s) SUB-Q Morning-Evening Patient not taking: Reported on 04/07/2023 01/18/22   [provider]  Zinc Sulfate (ZINC 15 PO) Take 1 tablet by mouth daily. Patient not taking: Reported on 04/07/2023    [provider]    Physical Exam: Vitals:   04/07/23 0808 04/07/23 0935 04/07/23 0945 04/07/23 1015  BP: (!) 126/52 (!) 107/48  (!) 110/52  Pulse: 92 77  80  Resp: 18 18    Temp: (!) 102.4 F (39.1 C)  100.2 F (37.9 C)   TempSrc: Oral  Oral   SpO2: 92% 92%  98%  Weight:      Height:        Constitutional: NAD, calm, comfortable Vitals:   04/07/23 0808 04/07/23 0935 04/07/23 0945 04/07/23 1015  BP: (!) 126/52 (!) 107/48  (!) 110/52  Pulse: 92 77  80  Resp: 18 18    Temp: (!) 102.4 F (39.1 C)  100.2 F (37.9 C)   TempSrc: Oral  Oral   SpO2: 92% 92%  98%  Weight:      Height:       Eyes: PERRL, lids and conjunctivae normal ENMT: Mucous membranes are moist. Posterior pharynx clear of any exudate or lesions.Normal dentition.  Neck: normal, supple, no masses, no thyromegaly Respiratory: clear to auscultation bilaterally, no  wheezing, no crackles. Normal respiratory effort. No accessory muscle use.  Cardiovascular: Regular rate and rhythm, no murmurs / rubs / gallops. No extremity edema. 2+ pedal pulses. No carotid bruits.  Abdomen: Right CVA tenderness, no masses palpated. No hepatosplenomegaly. Bowel sounds positive.  Musculoskeletal: no clubbing / cyanosis. No joint deformity upper and lower extremities. Good ROM, no contractures. Normal muscle tone.  Skin: no rashes, lesions, ulcers. No induration Neurologic: CN 2-12 grossly intact. Sensation intact, DTR normal. Strength 5/5 in all 4.  Psychiatric: Normal judgment and insight. Alert and oriented x 3. Normal mood.     Labs on Admission: I have personally reviewed following labs and imaging studies  CBC: Recent Labs  Lab 04/07/23 0652  WBC 8.0  NEUTROABS 6.3  HGB 9.2*  HCT 27.8*  MCV 89.1  PLT 241   Basic Metabolic Panel: Recent Labs  Lab 04/07/23 0652  NA 133*  K 3.5  CL 99  CO2 22  GLUCOSE 187*  BUN 38*  CREATININE 2.26*  CALCIUM 7.7*   GFR: Estimated Creatinine Clearance: 21.7 mL/min (A) (by C-G formula based on SCr of 2.26 mg/dL (H)). Liver Function Tests: Recent Labs  Lab 04/07/23 0652  AST 17  ALT 16  ALKPHOS 46  BILITOT 1.1  PROT 6.5  ALBUMIN 3.2*   Recent Labs  Lab 04/07/23 0652  LIPASE 29   No results for input(s): "AMMONIA" in the last 168 hours. Coagulation Profile: Recent Labs  Lab 04/07/23 0733  INR 1.4*   Cardiac Enzymes: No results for input(s): "CKTOTAL", "CKMB", "CKMBINDEX", "TROPONINI" in the last 168 hours. BNP (last 3 results) No results for input(s): "PROBNP" in the last 8760 hours. HbA1C: No results for input(s): "HGBA1C" in the last 72 hours. CBG: Recent Labs  Lab 04/07/23 0944  GLUCAP 231*  Lipid Profile: No results for input(s): "CHOL", "HDL", "LDLCALC", "TRIG", "CHOLHDL", "LDLDIRECT" in the last 72 hours. Thyroid Function Tests: No results for input(s): "TSH", "T4TOTAL", "FREET4",  "T3FREE", "THYROIDAB" in the last 72 hours. Anemia Panel: No results for input(s): "VITAMINB12", "FOLATE", "FERRITIN", "TIBC", "IRON", "RETICCTPCT" in the last 72 hours. Urine analysis:    Component Value Date/Time   COLORURINE YELLOW (A) 04/07/2023 0835   APPEARANCEUR CLOUDY (A) 04/07/2023 0835   APPEARANCEUR Cloudy (A) 12/10/2020 1051   LABSPEC 1.013 04/07/2023 0835   PHURINE 7.0 04/07/2023 0835   GLUCOSEU >=500 (A) 04/07/2023 0835   HGBUR SMALL (A) 04/07/2023 0835   BILIRUBINUR NEGATIVE 04/07/2023 0835   BILIRUBINUR Negative 12/10/2020 1051   KETONESUR NEGATIVE 04/07/2023 0835   PROTEINUR 100 (A) 04/07/2023 0835   NITRITE NEGATIVE 04/07/2023 0835   LEUKOCYTESUR LARGE (A) 04/07/2023 0835    Radiological Exams on Admission: US Abdomen Complete Result Date: 04/07/2023 CLINICAL DATA:  Abdominal pain, fever. EXAM: ABDOMEN ULTRASOUND COMPLETE COMPARISON:  Noncontrast CT 04/07/2023. Older CT examinations as well. FINDINGS: Gallbladder: Dilated gallbladder. No wall thickening or adjacent fluid. There are shadowing stones. Common bile duct: Diameter: 8 mm, within normal limits for the patient's age. In retrospect on the prior CT scan there may be a calcification along course of the distal common duct which is not as well seen on this ultrasound. Liver: No focal lesion identified. Within normal limits in parenchymal echogenicity. Portal vein is patent on color Doppler imaging with normal direction of blood flow towards the liver. IVC: No abnormality visualized. Pancreas: Visualized portion unremarkable. Spleen: Size and appearance within normal limits. Right Kidney: Length: 9.4 cm. Echogenicity within normal limits. No mass or hydronephrosis visualized. Mild perinephric fluid. Left Kidney: Length: 10.0 cm. Echogenicity within normal limits. No mass or hydronephrosis visualized. Abdominal aorta: No aneurysm visualized. Other findings: None. IMPRESSION: Dilated gallbladder with gallstones. No wall  thickening or adjacent fluid. Common duct at the upper limits normal for patient's age but on prior CT scan in retrospect there is a questionable calcification along the course of the common duct. Recommend follow-up MRCP to further delineate when able. Minimal perinephric fluid on the right. Electronically Signed   By: Karen Kays M.D.   On: 04/07/2023 10:07   CT ABDOMEN PELVIS WO CONTRAST Result Date: 04/07/2023 CLINICAL DATA:  78 year old female with nausea vomiting diarrhea and lower abdominal pain for 2 days. History of colon resection. EXAM: CT ABDOMEN AND PELVIS WITHOUT CONTRAST TECHNIQUE: Multidetector CT imaging of the abdomen and pelvis was performed following the standard protocol without IV contrast. RADIATION DOSE REDUCTION: This exam was performed according to the departmental dose-optimization program which includes automated exposure control, adjustment of the mA and/or kV according to patient size and/or use of iterative reconstruction technique. COMPARISON:  CT Abdomen and Pelvis 03/22/2021. FINDINGS: Lower chest: Borderline cardiomegaly. No pericardial or pleural effusion. Negative lung bases. Hepatobiliary: Cholelithiasis and more distended gallbladder (series 2, image 30) but no pericholecystic inflammation is evident. Negative noncontrast liver. Pancreas: Negative noncontrast appearance. Spleen: Negative. Adrenals/Urinary Tract: Normal adrenal glands. Chronic but more asymmetric pararenal space inflammatory stranding now, conspicuous on the right. The right renal pelvis and proximal ureter also appear to be inflamed. But no nephrolithiasis is identified. Contralateral left renal collecting system and ureter appear negative. The right ureter is difficult to delineate but does not appear dilated along its course. There are pelvic phleboliths, but no obvious ureteral calculus and the distal right ureter is decompressed at the UVJ on series 2, image  72. Urinary bladder appears unremarkable.  Stomach/Bowel: Distal large bowel anastomosis in the pelvis with no adverse features. Upstream large bowel decompressed. Gas in the transverse colon. Right colon appears negative, cecum partially located in the anterior pelvis. Appendix not identified, could be diminutive or absent. Terminal ileum is decompressed. No dilated small bowel. Small fat containing umbilical hernia. No ventral abdominal hernia. Decompressed stomach. No free air or free fluid identified. Vascular/Lymphatic: Severe Aortoiliac calcified atherosclerosis. Vascular patency is not evaluated in the absence of IV contrast. Bilateral proximal femoral artery vascular stents. Overlying chronic inguinal surgical clips. Normal caliber abdominal aorta.  No lymphadenopathy identified. Reproductive: Surgically absent uterus. Diminutive or absent ovaries. Other: No pelvis free fluid. Musculoskeletal: Previously augmented L4 compression fracture. Previously augmented bilateral sacral ala, medial left iliac. Chronic ORIF proximal left femur. Stable visualized osseous structures. IMPRESSION: 1. Noncontrast right kidney and renal collecting system appear inflamed. But no urinary calculus or obstructing etiology can be identified. Consider alternatively ascending urinary infection and/or Pyelonephritis (the latter requires CT Abdomen with IV contrast for imaging evaluation). 2. Cholelithiasis with greater gallbladder distension, but no strong CT evidence of acute cholecystitis. Right upper quadrant ultrasound would be complementary. 3. Distal large bowel anastomosis in the pelvis with no adverse features. No bowel obstruction or discrete bowel inflammation is identified. Electronically Signed   By: Odessa Fleming M.D.   On: 04/07/2023 08:08   DG Chest Port 1 View Result Date: 04/07/2023 CLINICAL DATA:  Questionable sepsis. Nausea and vomiting for 2 days. EXAM: PORTABLE CHEST 1 VIEW COMPARISON:  PA chest 09/14/2017. FINDINGS: Interval new right chest port device with  IJ approach catheter terminating in the distal SVC. The lungs are clear with mildly elevated right hemidiaphragm. No pleural effusion is seen. The mediastinum is normally outlined. The cardiac size is normal. There is calcification in the transverse aorta. Thoracic cage is intact.  There is thoracic spondylosis. IMPRESSION: 1. No evidence of acute chest disease. 2. Aortic atherosclerosis. 3. Right chest port device. Electronically Signed   By: Almira Bar M.D.   On: 04/07/2023 07:35    EKG: Ordered  Assessment/Plan Principal Problem:   Sepsis (HCC)  (please populate well all problems here in Problem List. (For example, if patient is on BP meds at home and you resume or decide to hold them, it is a problem that needs to be her. Same for CAD, COPD, HLD and so on)  Complicated UTI Right-sided pyonephritis -Continue ceftriaxone  Fever - As above  Intractable nausea vomiting diarrhea - Clinically suspect viral enteritis - GI pathogen sent - Symptoms appear to be self-limiting as patient reported no more diarrhea since last night and feeling nauseous has subsided as well.  IIDM -SSI  CKD stage IIIb - Euvolemic, received 1.5 L IV bolus in the ED - Keep monitoring kidney function  HTN - Blood pressure borderline low, will hold off home BP medication - Start as needed hydralazine  PVD - Continue aspirin Xarelto and statin   DVT prophylaxis: Xarelto Code Status: Full Family Communication: Husband at bedside Disposition Plan: Patient is sick with fever and complicated UTI and pyelonephritis requiring IV antibiotics, expect more than 2 midnight hospital stay Consults called: None Admission status: Telemetry admission   Emeline General MD [facility] Pager [phone]  04/07/2023, 10:54 AM

## 2023-04-07 NOTE — Progress Notes (Signed)
 Approximately 1850--Pt arrived from ED to room 216. Upon arrival, pt A&O x 4 and VS obtained. Pt oriented to room and unit. All fall precautions in place. Pt accompanied by her family, at bedside. Telemetry connected and notified.

## 2023-04-07 NOTE — ED Provider Notes (Signed)
 [facility] Provider Note    Event Date/Time   First MD Initiated Contact with Patient 04/07/23 [phone]     (approximate)   History   Abdominal Pain   HPI  Cheryl Leonard is a 78 y.o. female history of type 2 diabetes peripheral vascular disease neuropathy osteoporosis close renal insufficiency, appendectomy hysterectomy   2 days ago nausea vomiting diarrhea.  Nonbloody.  No abdominal pain occasional crampiness.  Increased urinary frequency.  No cough runny nose.  Some bodyaches.  Fever 102 at the house today.  No further vomiting or diarrhea since yesterday, still slight nausea.  Still running high fever  No recent travel history.  No history of C. difficile.  Denies recent antibiotic use  Unable to drink water, drink a bottle of Gatorade this morning.  Physical Exam   Triage Vital Signs: ED Triage Vitals  Encounter Vitals Group     BP 04/07/23 0650 (!) 141/48     Systolic BP Percentile --      Diastolic BP Percentile --      Pulse Rate 04/07/23 0650 100     Resp 04/07/23 0650 19     Temp 04/07/23 0650 (!) 103.2 F (39.6 C)     Temp Source 04/07/23 0650 Oral     SpO2 04/07/23 0650 95 %     Weight 04/07/23 0648 175 lb (79.4 kg)     Height 04/07/23 0648 5\' 5"  (1.651 m)     Head Circumference --      Peak Flow --      Pain Score 04/07/23 0648 4     Pain Loc --      Pain Education --      Exclude from Growth Chart --     Most recent vital signs: Vitals:   04/07/23 0945 04/07/23 1015  BP:  (!) 110/52  Pulse:  80  Resp:    Temp: 100.2 F (37.9 C)   SpO2:  98%     General: Awake, no distress.  Warm to touch appears mildly ill but in no distress CV:  Good peripheral perfusion.  Normal tones rate approximately 100 Resp:  Normal effort.  Clear bilateral.  Full clear sentences Abd:  No distention.  Denies pain to palpation in any area or quadrant.  Soft nontender nondistended throughout but very warm to touch.  Does relate a feeling of  urinary need or urgency Other:  Mucous membranes slightly dry.  Overall appears at least mildly ill but not acutely toxic.  Notably febrile with heart rate approximately 100 on arrival.    ED Results / Procedures / Treatments   Labs (all labs ordered are listed, but only abnormal results are displayed) Labs Reviewed  CBC WITH DIFFERENTIAL/PLATELET - Abnormal; Notable for the following components:      Result Value   RBC 3.12 (*)    Hemoglobin 9.2 (*)    HCT 27.8 (*)    Lymphs Abs 0.5 (*)    Monocytes Absolute 1.1 (*)    All other components within normal limits  COMPREHENSIVE METABOLIC PANEL WITH GFR - Abnormal; Notable for the following components:   Sodium 133 (*)    Glucose, Bld 187 (*)    BUN 38 (*)    Creatinine, Ser 2.26 (*)    Calcium 7.7 (*)    Albumin 3.2 (*)    GFR, Estimated 22 (*)    All other components within normal limits  URINALYSIS, ROUTINE W REFLEX MICROSCOPIC - Abnormal;  Notable for the following components:   Color, Urine YELLOW (*)    APPearance CLOUDY (*)    Glucose, UA >=500 (*)    Hgb urine dipstick SMALL (*)    Protein, ur 100 (*)    Leukocytes,Ua LARGE (*)    All other components within normal limits  PROTIME-INR - Abnormal; Notable for the following components:   Prothrombin Time 17.0 (*)    INR 1.4 (*)    All other components within normal limits  CBG MONITORING, ED - Abnormal; Notable for the following components:   Glucose-Capillary 231 (*)    All other components within normal limits  RESP PANEL BY RT-PCR (RSV, FLU A&B, COVID)  RVPGX2  CULTURE, BLOOD (ROUTINE X 2)  CULTURE, BLOOD (ROUTINE X 2)  GASTROINTESTINAL PANEL BY PCR, STOOL (REPLACES STOOL CULTURE)  C DIFFICILE QUICK SCREEN W PCR REFLEX    URINE CULTURE  LIPASE, BLOOD  LACTIC ACID, PLASMA     EKG  RADIOLOGY US Abdomen Complete Result Date: 04/07/2023 CLINICAL DATA:  Abdominal pain, fever. EXAM: ABDOMEN ULTRASOUND COMPLETE COMPARISON:  Noncontrast CT 04/07/2023. Older CT  examinations as well. FINDINGS: Gallbladder: Dilated gallbladder. No wall thickening or adjacent fluid. There are shadowing stones. Common bile duct: Diameter: 8 mm, within normal limits for the patient's age. In retrospect on the prior CT scan there may be a calcification along course of the distal common duct which is not as well seen on this ultrasound. Liver: No focal lesion identified. Within normal limits in parenchymal echogenicity. Portal vein is patent on color Doppler imaging with normal direction of blood flow towards the liver. IVC: No abnormality visualized. Pancreas: Visualized portion unremarkable. Spleen: Size and appearance within normal limits. Right Kidney: Length: 9.4 cm. Echogenicity within normal limits. No mass or hydronephrosis visualized. Mild perinephric fluid. Left Kidney: Length: 10.0 cm. Echogenicity within normal limits. No mass or hydronephrosis visualized. Abdominal aorta: No aneurysm visualized. Other findings: None. IMPRESSION: Dilated gallbladder with gallstones. No wall thickening or adjacent fluid. Common duct at the upper limits normal for patient's age but on prior CT scan in retrospect there is a questionable calcification along the course of the common duct. Recommend follow-up MRCP to further delineate when able. Minimal perinephric fluid on the right. Electronically Signed   By: Karen Kays M.D.   On: 04/07/2023 10:07   CT ABDOMEN PELVIS WO CONTRAST Result Date: 04/07/2023 CLINICAL DATA:  78 year old female with nausea vomiting diarrhea and lower abdominal pain for 2 days. History of colon resection. EXAM: CT ABDOMEN AND PELVIS WITHOUT CONTRAST TECHNIQUE: Multidetector CT imaging of the abdomen and pelvis was performed following the standard protocol without IV contrast. RADIATION DOSE REDUCTION: This exam was performed according to the departmental dose-optimization program which includes automated exposure control, adjustment of the mA and/or kV according to patient  size and/or use of iterative reconstruction technique. COMPARISON:  CT Abdomen and Pelvis 03/22/2021. FINDINGS: Lower chest: Borderline cardiomegaly. No pericardial or pleural effusion. Negative lung bases. Hepatobiliary: Cholelithiasis and more distended gallbladder (series 2, image 30) but no pericholecystic inflammation is evident. Negative noncontrast liver. Pancreas: Negative noncontrast appearance. Spleen: Negative. Adrenals/Urinary Tract: Normal adrenal glands. Chronic but more asymmetric pararenal space inflammatory stranding now, conspicuous on the right. The right renal pelvis and proximal ureter also appear to be inflamed. But no nephrolithiasis is identified. Contralateral left renal collecting system and ureter appear negative. The right ureter is difficult to delineate but does not appear dilated along its course. There are pelvic phleboliths, but no  obvious ureteral calculus and the distal right ureter is decompressed at the UVJ on series 2, image 72. Urinary bladder appears unremarkable. Stomach/Bowel: Distal large bowel anastomosis in the pelvis with no adverse features. Upstream large bowel decompressed. Gas in the transverse colon. Right colon appears negative, cecum partially located in the anterior pelvis. Appendix not identified, could be diminutive or absent. Terminal ileum is decompressed. No dilated small bowel. Small fat containing umbilical hernia. No ventral abdominal hernia. Decompressed stomach. No free air or free fluid identified. Vascular/Lymphatic: Severe Aortoiliac calcified atherosclerosis. Vascular patency is not evaluated in the absence of IV contrast. Bilateral proximal femoral artery vascular stents. Overlying chronic inguinal surgical clips. Normal caliber abdominal aorta.  No lymphadenopathy identified. Reproductive: Surgically absent uterus. Diminutive or absent ovaries. Other: No pelvis free fluid. Musculoskeletal: Previously augmented L4 compression fracture. Previously  augmented bilateral sacral ala, medial left iliac. Chronic ORIF proximal left femur. Stable visualized osseous structures. IMPRESSION: 1. Noncontrast right kidney and renal collecting system appear inflamed. But no urinary calculus or obstructing etiology can be identified. Consider alternatively ascending urinary infection and/or Pyelonephritis (the latter requires CT Abdomen with IV contrast for imaging evaluation). 2. Cholelithiasis with greater gallbladder distension, but no strong CT evidence of acute cholecystitis. Right upper quadrant ultrasound would be complementary. 3. Distal large bowel anastomosis in the pelvis with no adverse features. No bowel obstruction or discrete bowel inflammation is identified. Electronically Signed   By: Odessa Fleming M.D.   On: 04/07/2023 08:08   DG Chest Port 1 View Result Date: 04/07/2023 CLINICAL DATA:  Questionable sepsis. Nausea and vomiting for 2 days. EXAM: PORTABLE CHEST 1 VIEW COMPARISON:  PA chest 09/14/2017. FINDINGS: Interval new right chest port device with IJ approach catheter terminating in the distal SVC. The lungs are clear with mildly elevated right hemidiaphragm. No pleural effusion is seen. The mediastinum is normally outlined. The cardiac size is normal. There is calcification in the transverse aorta. Thoracic cage is intact.  There is thoracic spondylosis. IMPRESSION: 1. No evidence of acute chest disease. 2. Aortic atherosclerosis. 3. Right chest port device. Electronically Signed   By: Almira Bar M.D.   On: 04/07/2023 07:35       PROCEDURES:  Critical Care performed: No  Procedures   MEDICATIONS ORDERED IN ED: Medications  sodium chloride 0.9 % bolus 1,000 mL (0 mLs Intravenous Stopped 04/07/23 0935)  acetaminophen (TYLENOL) tablet 1,000 mg (1,000 mg Oral Given 04/07/23 0734)  ondansetron (ZOFRAN) injection 4 mg (4 mg Intravenous Given 04/07/23 0735)  sodium chloride 0.9 % bolus 500 mL (500 mLs Intravenous New Bag/Given 04/07/23 0945)   cefTRIAXone (ROCEPHIN) 1 g in sodium chloride 0.9 % 100 mL IVPB (0 g Intravenous Stopped 04/07/23 1017)     IMPRESSION / MDM / ASSESSMENT AND PLAN / ED COURSE  I reviewed the triage vital signs and the nursing notes.                              Differential diagnosis includes, but is not limited to, possible gastroenteritis, bacterial colitis, diverticulitis, viral syndrome, urinary tract infection especially given her noted increased frequency, etc.  She has no skin rashes no headache no pulmonary or cardiac symptoms.  All symptoms seem to be gastrointestinal in nature.  Orts nausea vomiting diarrhea yesterday with resolution of the vomiting and diarrhea today but still with persistent fever.  Given the patient's history comorbidities I think broad workup is necessitated in order  to identify source.  Suspicion for sepsis at this time at 7:20 AM but no acute source yet identified    Patient's presentation is most consistent with acute presentation with potential threat to life or bodily function.      Clinical Course as of 04/07/23 1024  Fri Apr 07, 2023  0733 Hemoglobin(!): 9.2 Very mild decrease from her baseline hemoglobin.  She does not report any black bloody stool or bleeding.  Her symptoms do not seem consistent with acute gastrointestinal bleeding at this time [MQ]  0733 GFR is very slightly reduced from previous baseline prior GFR approximately 24.  Will hydrate here generously, no clear evidence of acute kidney injury when compared to previous [MQ]  0733 Noncontrasted imaging ordered due to patient chronic kidney disease [MQ]  0946 Patient noted to have low diastolic blood pressure.  Alert and oriented.  Will give additional fluid bolus.  Suspect pyelonephritis, awaiting ultrasound imaging but given her increased urinary frequency high fever and urinalysis suspect UTI.  Sepsis initiated code sepsis [MQ]    Clinical Course User Index [MQ] Sharyn Creamer, MD   In light of  patient's urinalysis clinical history urinary frequency nausea vomiting fever suspect likely pyelonephritis involving the right kidney.  Patient will be admitted to hospitalist for further care and management.  At this time she does not have obvious findings to suggest acute cholecystitis cholangitis etc., particular no right upper quadrant pain and suspect urinary source  FINAL CLINICAL IMPRESSION(S) / ED DIAGNOSES   Final diagnoses:  Nausea and vomiting, unspecified vomiting type  Urinary tract infection, acute  Sepsis, due to unspecified organism, unspecified whether acute organ dysfunction present [facility])     Rx / DC Orders   ED Discharge Orders     None        Note:  This document was prepared using Dragon voice recognition software and may include unintentional dictation errors.   Sharyn Creamer, MD 04/07/23 1025

## 2023-04-07 NOTE — ED Notes (Signed)
 This RN to bedside to assess patient. Patient appearing severely agitated. Patient diaphoretic and attempting to get out of bed. This RN informed patient that she is not stable enough to get up at this time. Patient scooted back in bed into a comfortable position. Patient stating "I am so hot". This RN gave patient a fan and placed a cold rag on the patient's forehead. Patient acting altered. MD Zhang notified of patient's change in status. Patient consented to receive IM medication for agitation.

## 2023-04-08 DIAGNOSIS — E039 Hypothyroidism, unspecified: Secondary | ICD-10-CM

## 2023-04-08 DIAGNOSIS — N1832 Chronic kidney disease, stage 3b: Secondary | ICD-10-CM | POA: Diagnosis not present

## 2023-04-08 DIAGNOSIS — A419 Sepsis, unspecified organism: Secondary | ICD-10-CM | POA: Diagnosis not present

## 2023-04-08 DIAGNOSIS — E119 Type 2 diabetes mellitus without complications: Secondary | ICD-10-CM | POA: Diagnosis not present

## 2023-04-08 DIAGNOSIS — Z794 Long term (current) use of insulin: Secondary | ICD-10-CM

## 2023-04-08 LAB — HEMOGLOBIN A1C
Hgb A1c MFr Bld: 7.3 % — ABNORMAL HIGH (ref 4.8–5.6)
Mean Plasma Glucose: 163 mg/dL

## 2023-04-08 LAB — BLOOD CULTURE ID PANEL (REFLEXED) - BCID2

## 2023-04-08 LAB — CBC
HCT: 23.6 % — ABNORMAL LOW (ref 36.0–46.0)
Hemoglobin: 7.8 g/dL — ABNORMAL LOW (ref 12.0–15.0)
MCH: 29.3 pg (ref 26.0–34.0)
MCHC: 33.1 g/dL (ref 30.0–36.0)
MCV: 88.7 fL (ref 80.0–100.0)
Platelets: 240 10*3/uL (ref 150–400)
RBC: 2.66 MIL/uL — ABNORMAL LOW (ref 3.87–5.11)
RDW: 14.2 % (ref 11.5–15.5)
WBC: 7.3 10*3/uL (ref 4.0–10.5)
nRBC: 0 % (ref 0.0–0.2)

## 2023-04-08 LAB — GLUCOSE, CAPILLARY
Glucose-Capillary: 158 mg/dL — ABNORMAL HIGH (ref 70–99)
Glucose-Capillary: 285 mg/dL — ABNORMAL HIGH (ref 70–99)
Glucose-Capillary: 302 mg/dL — ABNORMAL HIGH (ref 70–99)
Glucose-Capillary: 305 mg/dL — ABNORMAL HIGH (ref 70–99)

## 2023-04-08 LAB — BASIC METABOLIC PANEL WITH GFR
Anion gap: 8 (ref 5–15)
BUN: 40 mg/dL — ABNORMAL HIGH (ref 8–23)
CO2: 22 mmol/L (ref 22–32)
Calcium: 7.3 mg/dL — ABNORMAL LOW (ref 8.9–10.3)
Chloride: 104 mmol/L (ref 98–111)
Creatinine, Ser: 2.36 mg/dL — ABNORMAL HIGH (ref 0.44–1.00)
GFR, Estimated: 21 mL/min — ABNORMAL LOW (ref >60)
Glucose, Bld: 187 mg/dL — ABNORMAL HIGH (ref 70–99)
Potassium: 3.4 mmol/L — ABNORMAL LOW (ref 3.5–5.1)
Sodium: 134 mmol/L — ABNORMAL LOW (ref 135–145)

## 2023-04-08 MED ORDER — INSULIN ASPART 100 UNIT/ML IJ SOLN
0.0000 [IU] | Freq: Three times a day (TID) | INTRAMUSCULAR | Status: DC
  Administered 2023-04-08: 7 [IU] via SUBCUTANEOUS
  Administered 2023-04-09 (×3): 2 [IU] via SUBCUTANEOUS
  Administered 2023-04-10: 3 [IU] via SUBCUTANEOUS
  Administered 2023-04-10: 1 [IU] via SUBCUTANEOUS
  Administered 2023-04-10 – 2023-04-11 (×2): 2 [IU] via SUBCUTANEOUS
  Filled 2023-04-08 (×8): qty 1

## 2023-04-08 MED ORDER — INSULIN GLARGINE-YFGN 100 UNIT/ML ~~LOC~~ SOLN
5.0000 [IU] | Freq: Every day | SUBCUTANEOUS | Status: DC
  Administered 2023-04-08 – 2023-04-10 (×3): 5 [IU] via SUBCUTANEOUS
  Filled 2023-04-08 (×3): qty 0.05

## 2023-04-08 MED ORDER — POTASSIUM CHLORIDE CRYS ER 20 MEQ PO TBCR
40.0000 meq | EXTENDED_RELEASE_TABLET | Freq: Once | ORAL | Status: AC
  Administered 2023-04-08: 40 meq via ORAL
  Filled 2023-04-08: qty 2

## 2023-04-08 MED ORDER — RIVAROXABAN 2.5 MG PO TABS
2.5000 mg | ORAL_TABLET | Freq: Two times a day (BID) | ORAL | Status: DC
  Administered 2023-04-08 – 2023-04-11 (×6): 2.5 mg via ORAL
  Filled 2023-04-08 (×6): qty 1

## 2023-04-08 MED ORDER — SODIUM CHLORIDE 0.9 % IV SOLN: INTRAVENOUS | Status: AC

## 2023-04-08 MED ORDER — INSULIN ASPART 100 UNIT/ML IJ SOLN
0.0000 [IU] | Freq: Every day | INTRAMUSCULAR | Status: DC
  Administered 2023-04-08: 4 [IU] via SUBCUTANEOUS
  Administered 2023-04-09: 2 [IU] via SUBCUTANEOUS
  Filled 2023-04-08 (×2): qty 1

## 2023-04-08 MED ORDER — INSULIN ASPART 100 UNIT/ML IJ SOLN
3.0000 [IU] | Freq: Three times a day (TID) | INTRAMUSCULAR | Status: DC
  Administered 2023-04-08 – 2023-04-11 (×8): 3 [IU] via SUBCUTANEOUS
  Filled 2023-04-08 (×8): qty 1

## 2023-04-08 MED ORDER — ACETAMINOPHEN 325 MG PO TABS
650.0000 mg | ORAL_TABLET | ORAL | Status: DC | PRN
Start: 2023-04-08 — End: 2023-04-11
  Administered 2023-04-08 – 2023-04-09 (×4): 650 mg via ORAL
  Filled 2023-04-08 (×5): qty 2

## 2023-04-08 NOTE — Progress Notes (Signed)
 PROGRESS NOTE    Cheryl Leonard  ZOX:[id number] DOB: 07/05/1945 DOA: 04/07/2023 PCP: Danella Penton, MD   Brief Narrative:   78 y.o. female with medical history significant of CKD stage IIIb, IIDM, diabetic neuropathy, HTN, PVD on aspirin Xarelto and statin, hypothyroidism, presented with nausea vomiting diarrhea and fever   4/5: added insulin for better sugar control, BCID growing e.coli (1/4 bottle)   Assessment & Plan:   Principal Problem:   Sepsis (HCC)   Sepsis (HCC) POA due to  E. coli UTI Right-sided pyonephritis -Continue ceftriaxone - BCID growing E.coli (1 of 4 bottle) - monitor fever curve and WBC   Intractable nausea vomiting diarrhea - Clinically suspect viral enteritis - GI panel neg - Symptoms appear to be self-limiting as patient reported no more diarrhea since last night and N/V has subsided as well.   IIDM -SSI, add long acting and meal coverage as sugar trending up   CKD stage IIIb - Euvolemic, monitor Lab Results  Component Value Date   CREATININE 2.36 (H) 04/08/2023   CREATININE 2.26 (H) 04/07/2023   CREATININE 2.14 (H) 12/14/2022   - Keep monitoring kidney function, NS @ 75 for 1 day   HTN - Blood pressure normal -  as needed hydralazine   PVD - Continue aspirin Xarelto and statin - adjust Xarelto dose per pharmacy   DVT prophylaxis: Xarelto  rivaroxaban (XARELTO) tablet 2.5 mg     Code Status: DNR - confirmed by patient and husband at bedside Family Communication: updated husband and DIL at bedside and daughter over the phone Disposition Plan: possible D/C in 2-3 days depending on clinical condition   Antimicrobials:  Rocephin    Subjective:  Weak, febrile (Tmax 101.6)  Objective: Vitals:   04/08/23 0750 04/08/23 1100 04/08/23 1253 04/08/23 1531  BP: (!) 123/51 (!) 118/54  118/75  Pulse: 78 81  75  Resp: 18 18    Temp: (!) 100.9 F (38.3 C) (!) 101.6 F (38.7 C) 99.6 F (37.6 C) 98.7 F (37.1 C)  TempSrc:  Oral   Oral  SpO2: 100% 96%  99%  Weight:      Height:        Intake/Output Summary (Last 24 hours) at 04/08/2023 1624 Last data filed at 04/08/2023 1215 Gross per 24 hour  Intake 210 ml  Output --  Net 210 ml   Filed Weights   04/07/23 0648  Weight: 79.4 kg    Examination:  General exam: Appears calm and comfortable  Respiratory system: Clear to auscultation. Respiratory effort normal. Cardiovascular system: S1 & S2 heard, RRR. No murmurs Gastrointestinal system: Abdomen is soft, benign Central nervous system: Alert and oriented. No focal neurological deficits. Extremities: Symmetric 5 x 5 power. Skin: No rashes, lesions or ulcers Psychiatry: Judgement and insight appear normal. Mood & affect appropriate.     Data Reviewed: I have personally reviewed following labs and imaging studies  CBC: Recent Labs  Lab 04/07/23 0652 04/08/23 0459  WBC 8.0 7.3  NEUTROABS 6.3  --   HGB 9.2* 7.8*  HCT 27.8* 23.6*  MCV 89.1 88.7  PLT 241 240   Basic Metabolic Panel: Recent Labs  Lab 04/07/23 0652 04/08/23 0459  NA 133* 134*  K 3.5 3.4*  CL 99 104  CO2 22 22  GLUCOSE 187* 187*  BUN 38* 40*  CREATININE 2.26* 2.36*  CALCIUM 7.7* 7.3*   GFR: Estimated Creatinine Clearance: 20.8 mL/min (A) (by C-G formula based on SCr of 2.36 mg/dL (H)).  Liver Function Tests: Recent Labs  Lab 04/07/23 0652  AST 17  ALT 16  ALKPHOS 46  BILITOT 1.1  PROT 6.5  ALBUMIN 3.2*   Recent Labs  Lab 04/07/23 0652  LIPASE 29   No results for input(s): "AMMONIA" in the last 168 hours. Coagulation Profile: Recent Labs  Lab 04/07/23 0733  INR 1.4*   Cardiac Enzymes: No results for input(s): "CKTOTAL", "CKMB", "CKMBINDEX", "TROPONINI" in the last 168 hours. BNP (last 3 results) No results for input(s): "PROBNP" in the last 8760 hours. HbA1C: Recent Labs    04/07/23 0652  HGBA1C 7.3*   CBG: Recent Labs  Lab 04/07/23 1615 04/07/23 2237 04/08/23 0751 04/08/23 1245 04/08/23 1544   GLUCAP 211* 251* 158* 285* 302*    Sepsis Labs: Recent Labs  Lab 04/07/23 0733  LATICACIDVEN 1.0    Recent Results (from the past 240 hours)  Blood Culture (routine x 2)     Status: None (Preliminary result)   Collection Time: 04/07/23  7:33 AM   Specimen: BLOOD  Result Value Ref Range Status   Specimen Description BLOOD BLOOD LEFT FOREARM  Final   Special Requests   Final    BOTTLES DRAWN AEROBIC AND ANAEROBIC Blood Culture results may not be optimal due to an inadequate volume of blood received in culture bottles   Culture   Final    NO GROWTH < 24 HOURS Performed at [facility], [address] [place], [place] [address]    Report Status PENDING  Incomplete  Blood Culture (routine x 2)     Status: None (Preliminary result)   Collection Time: 04/07/23  7:33 AM   Specimen: BLOOD  Result Value Ref Range Status   Specimen Description   Final    BLOOD BLOOD RIGHT HAND Performed at [facility], [address] [place], [place] [address]    Special Requests   Final    BOTTLES DRAWN AEROBIC AND ANAEROBIC Blood Culture adequate volume Performed at [facility], [address] [place], [place] [address]    Culture  Setup Time   Final    GRAM NEGATIVE RODS AEROBIC BOTTLE ONLY CRITICAL RESULT CALLED TO, READ BACK BY AND VERIFIED WITH: NATHAN BELUE AT 0305 ON 04/08/23 BY SS    Culture GRAM NEGATIVE RODS  Final   Report Status PENDING  Incomplete  Resp panel by RT-PCR (RSV, Flu A&B, Covid) Anterior Nasal Swab     Status: None   Collection Time: 04/07/23  7:33 AM   Specimen: Anterior Nasal Swab  Result Value Ref Range Status   SARS Coronavirus 2 by RT PCR NEGATIVE NEGATIVE Final    Comment: (NOTE) SARS-CoV-2 target nucleic acids are NOT DETECTED.  The SARS-CoV-2 RNA is generally detectable in upper respiratory specimens during the acute phase of infection. The lowest concentration of SARS-CoV-2 viral copies this assay can detect is 138  copies/mL. A negative result does not preclude SARS-Cov-2 infection and should not be used as the sole basis for treatment or other patient management decisions. A negative result may occur with  improper specimen collection/handling, submission of specimen other than nasopharyngeal swab, presence of viral mutation(s) within the areas targeted by this assay, and inadequate number of viral copies(<138 copies/mL). A negative result must be combined with clinical observations, patient history, and epidemiological information. The expected result is Negative.  Fact Sheet for Patients:  BloggerCourse.com  Fact Sheet for Healthcare Providers:  SeriousBroker.it  This test is no t yet approved or cleared  by the [place] and  has been authorized for detection and/or diagnosis of SARS-CoV-2 by FDA under an Emergency Use Authorization (EUA). This EUA will remain  in effect (meaning this test can be used) for the duration of the COVID-19 declaration under Section 564(b)(1) of the Act, 21 U.S.C.section 360bbb-3(b)(1), unless the authorization is terminated  or revoked sooner.       Influenza A by PCR NEGATIVE NEGATIVE Final   Influenza B by PCR NEGATIVE NEGATIVE Final    Comment: (NOTE) The Xpert Xpress SARS-CoV-2/FLU/RSV plus assay is intended as an aid in the diagnosis of influenza from Nasopharyngeal swab specimens and should not be used as a sole basis for treatment. Nasal washings and aspirates are unacceptable for Xpert Xpress SARS-CoV-2/FLU/RSV testing.  Fact Sheet for Patients: BloggerCourse.com  Fact Sheet for Healthcare Providers: SeriousBroker.it  This test is not yet approved or cleared by the [place] FDA and has been authorized for detection and/or diagnosis of SARS-CoV-2 by FDA under an Emergency Use Authorization (EUA). This EUA will remain in effect (meaning  this test can be used) for the duration of the COVID-19 declaration under Section 564(b)(1) of the Act, 21 U.S.C. section 360bbb-3(b)(1), unless the authorization is terminated or revoked.     Resp Syncytial Virus by PCR NEGATIVE NEGATIVE Final    Comment: (NOTE) Fact Sheet for Patients: BloggerCourse.com  Fact Sheet for Healthcare Providers: SeriousBroker.it  This test is not yet approved or cleared by the [place] FDA and has been authorized for detection and/or diagnosis of SARS-CoV-2 by FDA under an Emergency Use Authorization (EUA). This EUA will remain in effect (meaning this test can be used) for the duration of the COVID-19 declaration under Section 564(b)(1) of the Act, 21 U.S.C. section 360bbb-3(b)(1), unless the authorization is terminated or revoked.  Performed at [facility], [address] [place], [place] [address]   Blood Culture ID Panel (Reflexed)     Status: Abnormal   Collection Time: 04/07/23  7:33 AM  Result Value Ref Range Status   Enterococcus faecalis NOT DETECTED NOT DETECTED Final   Enterococcus Faecium NOT DETECTED NOT DETECTED Final   Listeria monocytogenes NOT DETECTED NOT DETECTED Final   Staphylococcus species NOT DETECTED NOT DETECTED Final   Staphylococcus aureus (BCID) NOT DETECTED NOT DETECTED Final   Staphylococcus epidermidis NOT DETECTED NOT DETECTED Final   Staphylococcus lugdunensis NOT DETECTED NOT DETECTED Final   Streptococcus species NOT DETECTED NOT DETECTED Final   Streptococcus agalactiae NOT DETECTED NOT DETECTED Final   Streptococcus pneumoniae NOT DETECTED NOT DETECTED Final   Streptococcus pyogenes NOT DETECTED NOT DETECTED Final   A.calcoaceticus-baumannii NOT DETECTED NOT DETECTED Final   Bacteroides fragilis NOT DETECTED NOT DETECTED Final   Enterobacterales DETECTED (A) NOT DETECTED Final    Comment: Enterobacterales represent a large order of gram  negative bacteria, not a single organism. NATHAN BELUE AT 0305 ON 04/08/23 BY SS    Enterobacter cloacae complex NOT DETECTED NOT DETECTED Final   Escherichia coli DETECTED (A) NOT DETECTED Final    Comment: CRITICAL RESULT CALLED TO, READ BACK BY AND VERIFIED WITH: NATHAN BELUE AT 0305 ON 04/08/23 BY SS    Klebsiella aerogenes NOT DETECTED NOT DETECTED Final   Klebsiella oxytoca NOT DETECTED NOT DETECTED Final   Klebsiella pneumoniae NOT DETECTED NOT DETECTED Final   Proteus species NOT DETECTED NOT DETECTED Final   Salmonella species NOT DETECTED NOT DETECTED Final   Serratia marcescens NOT DETECTED NOT DETECTED Final  Haemophilus influenzae NOT DETECTED NOT DETECTED Final   Neisseria meningitidis NOT DETECTED NOT DETECTED Final   Pseudomonas aeruginosa NOT DETECTED NOT DETECTED Final   Stenotrophomonas maltophilia NOT DETECTED NOT DETECTED Final   Candida albicans NOT DETECTED NOT DETECTED Final   Candida auris NOT DETECTED NOT DETECTED Final   Candida glabrata NOT DETECTED NOT DETECTED Final   Candida krusei NOT DETECTED NOT DETECTED Final   Candida parapsilosis NOT DETECTED NOT DETECTED Final   Candida tropicalis NOT DETECTED NOT DETECTED Final   Cryptococcus neoformans/gattii NOT DETECTED NOT DETECTED Final   CTX-M ESBL NOT DETECTED NOT DETECTED Final   Carbapenem resistance IMP NOT DETECTED NOT DETECTED Final   Carbapenem resistance KPC NOT DETECTED NOT DETECTED Final   Carbapenem resistance NDM NOT DETECTED NOT DETECTED Final   Carbapenem resist OXA 48 LIKE NOT DETECTED NOT DETECTED Final   Carbapenem resistance VIM NOT DETECTED NOT DETECTED Final    Comment: Performed at [facility], [address] [place], [place] [address]  Urine culture     Status: Abnormal (Preliminary result)   Collection Time: 04/07/23  8:35 AM   Specimen: Urine, Clean Catch  Result Value Ref Range Status   Specimen Description   Final    URINE, CLEAN CATCH Performed at [facility], [address] [place], [place] [address]    Special Requests   Final    NONE Performed at [facility], [address], [place], [place] [address]    Culture (A)  Final    70,000 COLONIES/mL ESCHERICHIA COLI SUSCEPTIBILITIES TO FOLLOW Performed at [facility], [address] [place], [place] [address]    Report Status PENDING  Incomplete  Gastrointestinal Panel by PCR , Stool     Status: None   Collection Time: 04/07/23  3:40 PM   Specimen: Stool  Result Value Ref Range Status   Campylobacter species NOT DETECTED NOT DETECTED Final   Plesimonas shigelloides NOT DETECTED NOT DETECTED Final   Salmonella species NOT DETECTED NOT DETECTED Final   Yersinia enterocolitica NOT DETECTED NOT DETECTED Final   Vibrio species NOT DETECTED NOT DETECTED Final   Vibrio cholerae NOT DETECTED NOT DETECTED Final   Enteroaggregative E coli (EAEC) NOT DETECTED NOT DETECTED Final   Enteropathogenic E coli (EPEC) NOT DETECTED NOT DETECTED Final   Enterotoxigenic E coli (ETEC) NOT DETECTED NOT DETECTED Final   Shiga like toxin producing E coli (STEC) NOT DETECTED NOT DETECTED Final   Shigella/Enteroinvasive E coli (EIEC) NOT DETECTED NOT DETECTED Final   Cryptosporidium NOT DETECTED NOT DETECTED Final   Cyclospora cayetanensis NOT DETECTED NOT DETECTED Final   Entamoeba histolytica NOT DETECTED NOT DETECTED Final   Giardia lamblia NOT DETECTED NOT DETECTED Final   Adenovirus F40/41 NOT DETECTED NOT DETECTED Final   Astrovirus NOT DETECTED NOT DETECTED Final   Norovirus GI/GII NOT DETECTED NOT DETECTED Final   Rotavirus A NOT DETECTED NOT DETECTED Final   Sapovirus (I, II, IV, and V) NOT DETECTED NOT DETECTED Final    Comment: Performed at [facility], [address] [place], [place] [address]  C Difficile Quick Screen w PCR reflex     Status: None   Collection Time: 04/07/23  3:40 PM   Specimen: Stool  Result Value Ref Range Status   C Diff antigen NEGATIVE  NEGATIVE Final   C Diff toxin NEGATIVE NEGATIVE Final   C Diff interpretation No C. difficile detected.  Final    Comment: Performed  at [facility], [address], [place], [place] [address]         Radiology Studies: US Abdomen Complete Result Date: 04/07/2023 CLINICAL DATA:  Abdominal pain, fever. EXAM: ABDOMEN ULTRASOUND COMPLETE COMPARISON:  Noncontrast CT 04/07/2023. Older CT examinations as well. FINDINGS: Gallbladder: Dilated gallbladder. No wall thickening or adjacent fluid. There are shadowing stones. Common bile duct: Diameter: 8 mm, within normal limits for the patient's age. In retrospect on the prior CT scan there may be a calcification along course of the distal common duct which is not as well seen on this ultrasound. Liver: No focal lesion identified. Within normal limits in parenchymal echogenicity. Portal vein is patent on color Doppler imaging with normal direction of blood flow towards the liver. IVC: No abnormality visualized. Pancreas: Visualized portion unremarkable. Spleen: Size and appearance within normal limits. Right Kidney: Length: 9.4 cm. Echogenicity within normal limits. No mass or hydronephrosis visualized. Mild perinephric fluid. Left Kidney: Length: 10.0 cm. Echogenicity within normal limits. No mass or hydronephrosis visualized. Abdominal aorta: No aneurysm visualized. Other findings: None. IMPRESSION: Dilated gallbladder with gallstones. No wall thickening or adjacent fluid. Common duct at the upper limits normal for patient's age but on prior CT scan in retrospect there is a questionable calcification along the course of the common duct. Recommend follow-up MRCP to further delineate when able. Minimal perinephric fluid on the right. Electronically Signed   By: Karen Kays M.D.   On: 04/07/2023 10:07   CT ABDOMEN PELVIS WO CONTRAST Result Date: 04/07/2023 CLINICAL DATA:  78 year old female with nausea vomiting diarrhea and lower abdominal pain for 2  days. History of colon resection. EXAM: CT ABDOMEN AND PELVIS WITHOUT CONTRAST TECHNIQUE: Multidetector CT imaging of the abdomen and pelvis was performed following the standard protocol without IV contrast. RADIATION DOSE REDUCTION: This exam was performed according to the departmental dose-optimization program which includes automated exposure control, adjustment of the mA and/or kV according to patient size and/or use of iterative reconstruction technique. COMPARISON:  CT Abdomen and Pelvis 03/22/2021. FINDINGS: Lower chest: Borderline cardiomegaly. No pericardial or pleural effusion. Negative lung bases. Hepatobiliary: Cholelithiasis and more distended gallbladder (series 2, image 30) but no pericholecystic inflammation is evident. Negative noncontrast liver. Pancreas: Negative noncontrast appearance. Spleen: Negative. Adrenals/Urinary Tract: Normal adrenal glands. Chronic but more asymmetric pararenal space inflammatory stranding now, conspicuous on the right. The right renal pelvis and proximal ureter also appear to be inflamed. But no nephrolithiasis is identified. Contralateral left renal collecting system and ureter appear negative. The right ureter is difficult to delineate but does not appear dilated along its course. There are pelvic phleboliths, but no obvious ureteral calculus and the distal right ureter is decompressed at the UVJ on series 2, image 72. Urinary bladder appears unremarkable. Stomach/Bowel: Distal large bowel anastomosis in the pelvis with no adverse features. Upstream large bowel decompressed. Gas in the transverse colon. Right colon appears negative, cecum partially located in the anterior pelvis. Appendix not identified, could be diminutive or absent. Terminal ileum is decompressed. No dilated small bowel. Small fat containing umbilical hernia. No ventral abdominal hernia. Decompressed stomach. No free air or free fluid identified. Vascular/Lymphatic: Severe Aortoiliac calcified  atherosclerosis. Vascular patency is not evaluated in the absence of IV contrast. Bilateral proximal femoral artery vascular stents. Overlying chronic inguinal surgical clips. Normal caliber abdominal aorta.  No lymphadenopathy identified. Reproductive: Surgically absent uterus. Diminutive or absent ovaries. Other: No pelvis free fluid. Musculoskeletal: Previously augmented L4 compression fracture. Previously augmented bilateral sacral ala,  medial left iliac. Chronic ORIF proximal left femur. Stable visualized osseous structures. IMPRESSION: 1. Noncontrast right kidney and renal collecting system appear inflamed. But no urinary calculus or obstructing etiology can be identified. Consider alternatively ascending urinary infection and/or Pyelonephritis (the latter requires CT Abdomen with IV contrast for imaging evaluation). 2. Cholelithiasis with greater gallbladder distension, but no strong CT evidence of acute cholecystitis. Right upper quadrant ultrasound would be complementary. 3. Distal large bowel anastomosis in the pelvis with no adverse features. No bowel obstruction or discrete bowel inflammation is identified. Electronically Signed   By: Odessa Fleming M.D.   On: 04/07/2023 08:08   DG Chest Port 1 View Result Date: 04/07/2023 CLINICAL DATA:  Questionable sepsis. Nausea and vomiting for 2 days. EXAM: PORTABLE CHEST 1 VIEW COMPARISON:  PA chest 09/14/2017. FINDINGS: Interval new right chest port device with IJ approach catheter terminating in the distal SVC. The lungs are clear with mildly elevated right hemidiaphragm. No pleural effusion is seen. The mediastinum is normally outlined. The cardiac size is normal. There is calcification in the transverse aorta. Thoracic cage is intact.  There is thoracic spondylosis. IMPRESSION: 1. No evidence of acute chest disease. 2. Aortic atherosclerosis. 3. Right chest port device. Electronically Signed   By: Almira Bar M.D.   On: 04/07/2023 07:35        Scheduled  Meds:  acidophilus  2 capsule Oral TID   aspirin EC  81 mg Oral Daily   escitalopram  10 mg Oral Daily   famotidine  40 mg Oral Daily   insulin aspart  0-5 Units Subcutaneous QHS   insulin aspart  0-9 Units Subcutaneous TID WC   insulin aspart  3 Units Subcutaneous TID WC   insulin glargine-yfgn  5 Units Subcutaneous QHS   levothyroxine  100 mcg Oral QAC breakfast   pantoprazole  40 mg Oral Daily   rivaroxaban  2.5 mg Oral BID   rosuvastatin  20 mg Oral Daily   sodium bicarbonate  650 mg Oral BID   zolpidem  5 mg Oral QHS   Continuous Infusions:  cefTRIAXone (ROCEPHIN)  IV Stopped (04/08/23 0958)     LOS: 1 day    Time spent: 35 mins    Kayen Grabel Sherryll Burger, MD [facility] Pager [phone]  If 7PM-7AM, please contact night-coverage [email or web address]  04/08/2023, 4:24 PM

## 2023-04-08 NOTE — Progress Notes (Signed)
   04/08/23 1100  Assess: MEWS Score  Temp (!) 101.6 F (38.7 C) (MD notified)  BP (!) 118/54  MAP (mmHg) 73  Pulse Rate 81  Resp 18  Level of Consciousness Alert  SpO2 96 %  O2 Device Nasal Cannula  O2 Flow Rate (L/min) 2 L/min  Assess: MEWS Score  MEWS Temp 2  MEWS Systolic 0  MEWS Pulse 0  MEWS RR 0  MEWS LOC 0  MEWS Score 2  MEWS Score Color Yellow  Assess: if the MEWS score is Yellow or Red  Were vital signs accurate and taken at a resting state? Yes  Does the patient meet 2 or more of the SIRS criteria? No  Does the patient have a confirmed or suspected source of infection? Yes  MEWS guidelines implemented  Yes, yellow  Treat  MEWS Interventions Considered administering scheduled or prn medications/treatments as ordered  Take Vital Signs  Increase Vital Sign Frequency  Yellow: Q2hr x1, continue Q4hrs until patient remains green for 12hrs  Escalate  MEWS: Escalate Yellow: Discuss with charge nurse and consider notifying provider and/or RRT  Notify: Charge Nurse/RN  Name of Charge Nurse/RN Notified Ale, RN  Provider Notification  Provider Name/Title Sherryll Burger MD  Date Provider Notified 04/08/23  Time Provider Notified 1105  Method of Notification Page  Notification Reason Other (Comment) (Yellow MEWS)  Provider response See new orders  Date of Provider Response 04/08/23  Time of Provider Response 1106  Assess: SIRS CRITERIA  SIRS Temperature  1  SIRS Respirations  0  SIRS Pulse 0  SIRS WBC 0  SIRS Score Sum  1

## 2023-04-08 NOTE — Plan of Care (Signed)

## 2023-04-08 NOTE — Consult Note (Signed)
 PHARMACY CONSULT NOTE - FOLLOW UP  Pharmacy Consult for Electrolyte Monitoring and Replacement   Recent Labs: Potassium (mmol/L)  Date Value  04/08/2023 3.4 (L)   Magnesium (mg/dL)  Date Value  16/10/9602 2.2   Calcium (mg/dL)  Date Value  54/09/8117 7.3 (L)   Albumin (g/dL)  Date Value  14/78/2956 3.2 (L)   Phosphorus (mg/dL)  Date Value  21/30/8657 3.7   Sodium (mmol/L)  Date Value  04/08/2023 134 (L)     Assessment: 78 y.o. female with medical history significant of CKD stage IIIb, IIDM, diabetic neuropathy, HTN, PVD on aspirin Xarelto and statin, hypothyroidism, presented with nausea vomiting diarrhea and fever. Scr trending up.   Nabicarb 650 mg BID.   Goal of Therapy:  WNL  Plan:  Medical team ordered Kcl 40 mEq x 1 F/u with AM labs   Ronnald Ramp ,PharmD Clinical Pharmacist 04/08/2023 7:50 AM

## 2023-04-08 NOTE — Progress Notes (Signed)
 PHARMACY - PHYSICIAN COMMUNICATION CRITICAL VALUE ALERT - BLOOD CULTURE IDENTIFICATION (BCID)  Results for orders placed or performed during the hospital encounter of 04/07/23  Blood Culture (routine x 2)     Status: None (Preliminary result)   Collection Time: 04/07/23  7:33 AM   Specimen: BLOOD  Result Value Ref Range Status   Specimen Description BLOOD BLOOD RIGHT HAND  Final   Special Requests   Final    BOTTLES DRAWN AEROBIC AND ANAEROBIC Blood Culture adequate volume   Culture  Setup Time   Final    Organism ID to follow Performed at [facility], [address] [place], [place] [address]    Culture PENDING  Incomplete   Report Status PENDING  Incomplete  Resp panel by RT-PCR (RSV, Flu A&B, Covid) Anterior Nasal Swab     Status: None   Collection Time: 04/07/23  7:33 AM   Specimen: Anterior Nasal Swab  Result Value Ref Range Status   SARS Coronavirus 2 by RT PCR NEGATIVE NEGATIVE Final    Comment: (NOTE) SARS-CoV-2 target nucleic acids are NOT DETECTED.  The SARS-CoV-2 RNA is generally detectable in upper respiratory specimens during the acute phase of infection. The lowest concentration of SARS-CoV-2 viral copies this assay can detect is 138 copies/mL. A negative result does not preclude SARS-Cov-2 infection and should not be used as the sole basis for treatment or other patient management decisions. A negative result may occur with  improper specimen collection/handling, submission of specimen other than nasopharyngeal swab, presence of viral mutation(s) within the areas targeted by this assay, and inadequate number of viral copies(<138 copies/mL). A negative result must be combined with clinical observations, patient history, and epidemiological information. The expected result is Negative.  Fact Sheet for Patients:  BloggerCourse.com  Fact Sheet for Healthcare Providers:  SeriousBroker.it  This test is  no t yet approved or cleared by the [place] FDA and  has been authorized for detection and/or diagnosis of SARS-CoV-2 by FDA under an Emergency Use Authorization (EUA). This EUA will remain  in effect (meaning this test can be used) for the duration of the COVID-19 declaration under Section 564(b)(1) of the Act, 21 U.S.C.section 360bbb-3(b)(1), unless the authorization is terminated  or revoked sooner.       Influenza A by PCR NEGATIVE NEGATIVE Final   Influenza B by PCR NEGATIVE NEGATIVE Final    Comment: (NOTE) The Xpert Xpress SARS-CoV-2/FLU/RSV plus assay is intended as an aid in the diagnosis of influenza from Nasopharyngeal swab specimens and should not be used as a sole basis for treatment. Nasal washings and aspirates are unacceptable for Xpert Xpress SARS-CoV-2/FLU/RSV testing.  Fact Sheet for Patients: BloggerCourse.com  Fact Sheet for Healthcare Providers: SeriousBroker.it  This test is not yet approved or cleared by the [place] FDA and has been authorized for detection and/or diagnosis of SARS-CoV-2 by FDA under an Emergency Use Authorization (EUA). This EUA will remain in effect (meaning this test can be used) for the duration of the COVID-19 declaration under Section 564(b)(1) of the Act, 21 U.S.C. section 360bbb-3(b)(1), unless the authorization is terminated or revoked.     Resp Syncytial Virus by PCR NEGATIVE NEGATIVE Final    Comment: (NOTE) Fact Sheet for Patients: BloggerCourse.com  Fact Sheet for Healthcare Providers: SeriousBroker.it  This test is not yet approved or cleared by the [place] FDA and has been authorized for detection and/or diagnosis of SARS-CoV-2 by FDA under an Emergency Use Authorization (EUA). This EUA will  remain in effect (meaning this test can be used) for the duration of the COVID-19 declaration under Section  564(b)(1) of the Act, 21 U.S.C. section 360bbb-3(b)(1), unless the authorization is terminated or revoked.  Performed at [facility], [address] [place], [place] [address]   Gastrointestinal Panel by PCR , Stool     Status: None   Collection Time: 04/07/23  3:40 PM   Specimen: Stool  Result Value Ref Range Status   Campylobacter species NOT DETECTED NOT DETECTED Final   Plesimonas shigelloides NOT DETECTED NOT DETECTED Final   Salmonella species NOT DETECTED NOT DETECTED Final   Yersinia enterocolitica NOT DETECTED NOT DETECTED Final   Vibrio species NOT DETECTED NOT DETECTED Final   Vibrio cholerae NOT DETECTED NOT DETECTED Final   Enteroaggregative E coli (EAEC) NOT DETECTED NOT DETECTED Final   Enteropathogenic E coli (EPEC) NOT DETECTED NOT DETECTED Final   Enterotoxigenic E coli (ETEC) NOT DETECTED NOT DETECTED Final   Shiga like toxin producing E coli (STEC) NOT DETECTED NOT DETECTED Final   Shigella/Enteroinvasive E coli (EIEC) NOT DETECTED NOT DETECTED Final   Cryptosporidium NOT DETECTED NOT DETECTED Final   Cyclospora cayetanensis NOT DETECTED NOT DETECTED Final   Entamoeba histolytica NOT DETECTED NOT DETECTED Final   Giardia lamblia NOT DETECTED NOT DETECTED Final   Adenovirus F40/41 NOT DETECTED NOT DETECTED Final   Astrovirus NOT DETECTED NOT DETECTED Final   Norovirus GI/GII NOT DETECTED NOT DETECTED Final   Rotavirus A NOT DETECTED NOT DETECTED Final   Sapovirus (I, II, IV, and V) NOT DETECTED NOT DETECTED Final    Comment: Performed at [facility], [address] [place], [place] [address]  C Difficile Quick Screen w PCR reflex     Status: None   Collection Time: 04/07/23  3:40 PM   Specimen: Stool  Result Value Ref Range Status   C Diff antigen NEGATIVE NEGATIVE Final   C Diff toxin NEGATIVE NEGATIVE Final   C Diff interpretation No C. difficile detected.  Final    Comment: Performed at [facility], [address]  [place], [place] [address]    BCID Results: 1 of 4 bottles with E. Coli, no resistance.  Pt currently on Ceftriaxone 2 gm q24h.  Name of provider contacted: Cliffton Asters, NP   Changes to prescribed antibiotics required: No changes needed at this time.  Otelia Sergeant, PharmD, [facility] 04/08/2023 3:03 AM

## 2023-04-08 NOTE — Evaluation (Addendum)
 Physical Therapy Evaluation Patient Details Name: Cheryl Leonard MRN: [id number] DOB: 1945-08-14 Today's Date: 04/08/2023  History of Present Illness  Pt is a 78 y.o. female presenting to hospital 04/07/23 with c/o abdominal pain, N/V/D, and fever.  Pt admitted with complicated UTI, fever, intractable N/V/D.  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  Clinical Impression  Prior to recent medical concerns, pt reports being independent with ambulation; lives with her husband on main level of home with 4 STE B railings.  No c/o pain during session.  A&Ox4.  Currently pt is SBA semi-supine to sitting EOB; min assist with transfers (with RW use); and CGA to ambulate short distance in hospital room with RW use.  Mild SOB noted with activity (SpO2 sats 94% or greater on 2 L O2 via nasal cannula post ambulation).  Pt would currently benefit from skilled PT to address noted impairments and functional limitations (see below for any additional details).  Upon hospital discharge, pt would benefit from ongoing therapy.     If plan is discharge home, recommend the following: A little help with walking and/or transfers;A little help with bathing/dressing/bathroom;Assistance with cooking/housework;Assist for transportation;Help with stairs or ramp for entrance   Can travel by private vehicle        Equipment Recommendations Rolling walker (2 wheels) (pt reports having RW at home already)  Recommendations for Other Services  OT consult    Functional Status Assessment Patient has had a recent decline in their functional status and demonstrates the ability to make significant improvements in function in a reasonable and predictable amount of time.     Precautions / Restrictions Precautions Precautions: Fall Recall of  Precautions/Restrictions: Intact Restrictions Weight Bearing Restrictions Per Provider Order: No      Mobility  Bed Mobility Overal bed mobility: Needs Assistance Bed Mobility: Supine to Sit     Supine to sit: Supervision, HOB elevated     General bed mobility comments: mild increased effort to perform on own    Transfers Overall transfer level: Needs assistance Equipment used: Rolling walker (2 wheels) Transfers: Sit to/from Stand Sit to Stand: Min assist           General transfer comment: min assist to stand from bed x1 trial and from toilet (with use of grab bar) x1 trial; vc's for UE placement; increased effort to stand noted    Ambulation/Gait Ambulation/Gait assistance: Contact guard assist Gait Distance (Feet):  (about 8 feet to bathroom; x20 feet in room) Assistive device: Rolling walker (2 wheels) Gait Pattern/deviations: Step-through pattern, Decreased step length - right, Decreased step length - left       General Gait Details: CGA for safety; mild SOB with ambulation noted  Stairs            Wheelchair Mobility     Tilt Bed    Modified Rankin (Stroke Patients Only)       Balance Overall balance assessment: Needs assistance Sitting-balance support: No upper extremity supported, Feet supported Sitting balance-Leahy Scale: Good Sitting balance - Comments: steady reaching within BOS   Standing balance support: Bilateral upper extremity supported, During functional activity, Reliant on assistive device for balance Standing balance-Leahy Scale: Good Standing balance comment: steady ambulating with RW use  Pertinent Vitals/Pain Pain Assessment Pain Assessment: No/denies pain HR 84-96 bpm during sessions activities.    Home Living Family/patient expects to be discharged to:: Private residence Living Arrangements: Spouse/significant other Available Help at Discharge: Family;Available 24  hours/day Type of Home: House Home Access: Stairs to enter Entrance Stairs-Rails: Doctor, general practice of Steps: 4   Home Layout: Two level;Able to live on main level with bedroom/bathroom Home Equipment: Rolling Walker (2 wheels);Cane - single point      Prior Function Prior Level of Function : Independent/Modified Independent             Mobility Comments: Pt reports being independent with ambulation.  Pt reports no recent falls.       Extremity/Trunk Assessment   Upper Extremity Assessment Upper Extremity Assessment: Generalized weakness    Lower Extremity Assessment Lower Extremity Assessment: Generalized weakness       Communication   Communication Communication: No apparent difficulties    Cognition Arousal: Alert Behavior During Therapy: WFL for tasks assessed/performed   PT - Cognitive impairments: No apparent impairments                       PT - Cognition Comments: A&Ox4 Following commands: Intact       Cueing Cueing Techniques: Verbal cues     General Comments  Nursing cleared pt for participation in physical therapy.  Pt agreeable to PT session.  At least 1 family member present most of session.    Exercises     Assessment/Plan    PT Assessment Patient needs continued PT services  PT Problem List Decreased strength;Decreased activity tolerance;Decreased balance;Decreased mobility;Decreased knowledge of precautions;Decreased knowledge of use of DME       PT Treatment Interventions DME instruction;Gait training;Stair training;Functional mobility training;Therapeutic activities;Therapeutic exercise;Balance training;Patient/family education    PT Goals (Current goals can be found in the Care Plan section)  Acute Rehab PT Goals Patient Stated Goal: to improve strength and walking PT Goal Formulation: With patient Time For Goal Achievement: 04/22/23 Potential to Achieve Goals: Good    Frequency Min 2X/week      Co-evaluation               AM-PAC PT "6 Clicks" Mobility  Outcome Measure Help needed turning from your back to your side while in a flat bed without using bedrails?: None Help needed moving from lying on your back to sitting on the side of a flat bed without using bedrails?: A Little Help needed moving to and from a bed to a chair (including a wheelchair)?: A Little Help needed standing up from a chair using your arms (e.g., wheelchair or bedside chair)?: A Little Help needed to walk in hospital room?: A Little Help needed climbing 3-5 steps with a railing? : A Lot 6 Click Score: 18    End of Session Equipment Utilized During Treatment: Gait belt;Oxygen (2 L via nasal cannula) Activity Tolerance: Patient tolerated treatment well Patient left: in chair;with call bell/phone within reach;with chair alarm set;with family/visitor present;Other (comment) (pt's husband present) Nurse Communication: Mobility status;Precautions PT Visit Diagnosis: Other abnormalities of gait and mobility (R26.89);Muscle weakness (generalized) (M62.81)    Time: 1610-9604 PT Time Calculation (min) (ACUTE ONLY): 42 min   Charges:   PT Evaluation $PT Eval Low Complexity: 1 Low PT Treatments $Therapeutic Activity: 8-22 mins PT General Charges $$ ACUTE PT VISIT: 1 Visit        Hendricks Limes, PT 04/08/23, 5:57 PM  Addendum: pt on  2 L O2 via nasal cannula during session. Hendricks Limes, PT 04/08/23, 6:04 PM

## 2023-04-09 DIAGNOSIS — E039 Hypothyroidism, unspecified: Secondary | ICD-10-CM | POA: Diagnosis not present

## 2023-04-09 DIAGNOSIS — E1169 Type 2 diabetes mellitus with other specified complication: Secondary | ICD-10-CM

## 2023-04-09 DIAGNOSIS — F32A Depression, unspecified: Secondary | ICD-10-CM | POA: Diagnosis not present

## 2023-04-09 DIAGNOSIS — A419 Sepsis, unspecified organism: Secondary | ICD-10-CM | POA: Diagnosis not present

## 2023-04-09 LAB — URINE CULTURE: Culture: 70000 — AB

## 2023-04-09 LAB — BASIC METABOLIC PANEL WITH GFR
Anion gap: 9 (ref 5–15)
BUN: 37 mg/dL — ABNORMAL HIGH (ref 8–23)
CO2: 21 mmol/L — ABNORMAL LOW (ref 22–32)
Calcium: 7.4 mg/dL — ABNORMAL LOW (ref 8.9–10.3)
Chloride: 108 mmol/L (ref 98–111)
Creatinine, Ser: 1.85 mg/dL — ABNORMAL HIGH (ref 0.44–1.00)
GFR, Estimated: 28 mL/min — ABNORMAL LOW (ref >60)
Glucose, Bld: 153 mg/dL — ABNORMAL HIGH (ref 70–99)
Potassium: 4 mmol/L (ref 3.5–5.1)
Sodium: 138 mmol/L (ref 135–145)

## 2023-04-09 LAB — CBC
HCT: 27.5 % — ABNORMAL LOW (ref 36.0–46.0)
Hemoglobin: 8.9 g/dL — ABNORMAL LOW (ref 12.0–15.0)
MCH: 29.2 pg (ref 26.0–34.0)
MCHC: 32.4 g/dL (ref 30.0–36.0)
MCV: 90.2 fL (ref 80.0–100.0)
Platelets: 242 10*3/uL (ref 150–400)
RBC: 3.05 MIL/uL — ABNORMAL LOW (ref 3.87–5.11)
RDW: 14.6 % (ref 11.5–15.5)
WBC: 5.1 10*3/uL (ref 4.0–10.5)
nRBC: 0 % (ref 0.0–0.2)

## 2023-04-09 LAB — GLUCOSE, CAPILLARY
Glucose-Capillary: 183 mg/dL — ABNORMAL HIGH (ref 70–99)
Glucose-Capillary: 200 mg/dL — ABNORMAL HIGH (ref 70–99)
Glucose-Capillary: 151 mg/dL — ABNORMAL HIGH (ref 70–99)
Glucose-Capillary: 211 mg/dL — ABNORMAL HIGH (ref 70–99)

## 2023-04-09 NOTE — Plan of Care (Signed)

## 2023-04-09 NOTE — Consult Note (Signed)
 PHARMACY CONSULT NOTE  Pharmacy Consult for Electrolyte Monitoring and Replacement   Recent Labs: Potassium (mmol/L)  Date Value  04/09/2023 4.0   Magnesium (mg/dL)  Date Value  16/10/9602 2.2   Calcium (mg/dL)  Date Value  54/09/8117 7.4 (L)   Albumin (g/dL)  Date Value  14/78/2956 3.2 (L)   Phosphorus (mg/dL)  Date Value  21/30/8657 3.7   Sodium (mmol/L)  Date Value  04/09/2023 138   Assessment: 78 y.o. female with medical history significant of CKD stage IIIb, IIDM, diabetic neuropathy, HTN, PVD on aspirin Xarelto and statin, hypothyroidism, presented with nausea vomiting diarrhea and fever. Scr trending up.   Goal of Therapy:  WNL  Plan:  --No electrolyte replacement indicated at this time --Will sign off on electrolyte consult. Will follow along peripherally  Tressie Ellis  04/09/2023 9:15 AM

## 2023-04-09 NOTE — Progress Notes (Signed)
 PT Cancellation Note  Patient Details Name: Cheryl Leonard MRN: [id number] DOB: [birth year]   Cancelled Treatment:    Reason Eval/Treat Not Completed: Patient declined, no reason specified. Pt politely declining any OOB mobility or participation in any therapy at this time. Pt reporting that she had recently worked with OT and had walked in the hall. PT will continue to follow-up with pt acutely as available and appropriate.    Alessandra Bevels Jonuel Butterfield 04/09/2023, 11:23 AM

## 2023-04-09 NOTE — Progress Notes (Signed)
 Occupational Therapy Evaluation Patient Details Name: Cheryl Leonard MRN: [id number] DOB: 1945/01/20 Today's Date: 04/09/2023   History of Present Illness   Pt is a 78 y.o. female presenting to hospital 04/07/23 with c/o abdominal pain, N/V/D, and fever.  Pt admitted with complicated UTI, fever, intractable N/V/D.  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     Clinical Impressions Pt was seen for OT evaluation this date. Prior to hospital admission, pt was indep with amb, most ADL/IADLs. Pt reports her husband manages her medication. Pt lives with her husband in home with 2 steps to enter, able to live on main level. Pt presents to acute OT demonstrating impaired balance and functional mobility 2/2 (See OT problem list for additional functional deficits). On arrival to room pt standing at sink brushing teeth with IV pole for stability. Pt amb to EOB for LB dressing; CGA for stability with no DME use. Pt currently requires CGA during hallway amb with RW, ~132ft, noted 2 right sided leaning LOB, minA to assist to correct LOB. Pt returned to bed for rest with husband at bedside. VSS, on RA throughout. Pt would benefit from skilled OT services to address noted impairments and functional limitations (see below for any additional details) in order to maximize safety and independence while minimizing falls risk and caregiver burden. OT will follow acutely.     If plan is discharge home, recommend the following:   A little help with walking and/or transfers;A little help with bathing/dressing/bathroom;Assistance with cooking/housework;Assist for transportation     Functional Status Assessment   Patient has had a recent decline in their functional status and demonstrates the ability to make significant improvements in  function in a reasonable and predictable amount of time.     Equipment Recommendations   None recommended by OT     Recommendations for Other Services         Precautions/Restrictions   Precautions Precautions: Fall Recall of Precautions/Restrictions: Intact Restrictions Weight Bearing Restrictions Per Provider Order: No     Mobility Bed Mobility Overal bed mobility: Needs Assistance Bed Mobility: Sit to Supine       Sit to supine: Supervision, HOB elevated   General bed mobility comments: No physical assistance, supervision throughout    Transfers Overall transfer level: Needs assistance Equipment used: Rolling walker (2 wheels) Transfers: Sit to/from Stand Sit to Stand: Contact guard assist           General transfer comment: Pt STS from EOB with no physical assistance with use of RW.      Balance Overall balance assessment: Needs assistance Sitting-balance support: No upper extremity supported, Feet supported Sitting balance-Leahy Scale: Good Sitting balance - Comments: steady reaching within BOS   Standing balance support: Single extremity supported, During functional activity, Reliant on assistive device for balance Standing balance-Leahy Scale: Good                             ADL either performed or assessed with clinical judgement   ADL Overall ADL's : Needs assistance/impaired     Grooming: Oral care;Wash/dry face;Brushing hair;Standing               Lower Body Dressing: Contact guard assist;Sit to/from stand  Functional mobility during ADLs: Contact guard assist;Rolling walker (2 wheels) General ADL Comments: On arrival to room pt standing at sink brushing teeth with IV pole for stability. Pt amb to EOB for LB dressing; CGA for stability with no DME use.     Vision Baseline Vision/History: 0 No visual deficits              Pertinent Vitals/Pain Pain Assessment Pain Assessment: No/denies  pain     Extremity/Trunk Assessment Upper Extremity Assessment Upper Extremity Assessment: Overall WFL for tasks assessed   Lower Extremity Assessment Lower Extremity Assessment: Generalized weakness;Overall [facility] for tasks assessed   Cervical / Trunk Assessment Cervical / Trunk Assessment: Normal   Communication Communication Communication: No apparent difficulties   Cognition Arousal: Alert Behavior During Therapy: WFL for tasks assessed/performed Cognition: No apparent impairments             OT - Cognition Comments: A/Ox4                 Following commands: Intact       Cueing  General Comments   Cueing Techniques: Verbal cues      Exercises Exercises: Other exercises Other Exercises Other Exercises: Edu: Role of OT, d/c planning, safe ADL completion with use of DME   Shoulder Instructions      Home Living Family/patient expects to be discharged to:: Private residence Living Arrangements: Spouse/significant other Available Help at Discharge: Family;Available 24 hours/day Type of Home: House Home Access: Stairs to enter [employer] of Steps: 4 Entrance Stairs-Rails: Right;Left Home Layout: Two level;Able to live on main level with bedroom/bathroom     Bathroom Shower/Tub: Producer, television/film/video: Standard Bathroom Accessibility: Yes How Accessible: Accessible via walker Home Equipment: Rolling Walker (2 wheels);Cane - single point;Shower seat;BSC/3in1;Wheelchair - manual          Prior Functioning/Environment Prior Level of Function : Independent/Modified Independent;Driving             Mobility Comments: Pt reports being independent with ambulation.  Pt reports no recent falls. ADLs Comments: Husband manages medications    OT Problem List: Decreased strength;Decreased activity tolerance;Impaired balance (sitting and/or standing);Decreased coordination;Decreased safety awareness;Decreased knowledge of use of DME  or AE;Decreased knowledge of precautions   OT Treatment/Interventions: Energy conservation;DME and/or AE instruction;Therapeutic activities;Patient/family education;Balance training;Self-care/ADL training      OT Goals(Current goals can be found in the care plan section)   Acute Rehab OT Goals Patient Stated Goal: go home OT Goal Formulation: With patient Time For Goal Achievement: 04/23/23 Potential to Achieve Goals: Good ADL Goals Pt Will Perform Grooming: with modified independence;standing Pt Will Perform Lower Body Dressing: with modified independence;sit to/from stand Pt Will Transfer to Toilet: with modified independence;ambulating;regular height toilet Pt Will Perform Toileting - Clothing Manipulation and hygiene: with modified independence;sit to/from stand   OT Frequency:  Min 2X/week    Co-evaluation              AM-PAC OT "6 Clicks" Daily Activity     Outcome Measure Help from another person eating meals?: None Help from another person taking care of personal grooming?: A Little Help from another person toileting, which includes using toliet, bedpan, or urinal?: None Help from another person bathing (including washing, rinsing, drying)?: A Little Help from another person to put on and taking off regular upper body clothing?: None Help from another person to put on and taking off regular lower body clothing?: None 6 Click Score: 22  End of Session Equipment Utilized During Treatment: Gait belt;Rolling walker (2 wheels) Nurse Communication: Mobility status  Activity Tolerance: Patient tolerated treatment well Patient left: in bed;with family/visitor present;with call bell/phone within reach  OT Visit Diagnosis: Unsteadiness on feet (R26.81);Other abnormalities of gait and mobility (R26.89)                Time: 1610-9604 OT Time Calculation (min): 24 min Charges:  OT General Charges $OT Visit: 1 Visit OT Evaluation $OT Eval Low Complexity: 1 Low OT  Treatments $Self Care/Home Management : 8-22 mins  Glenard Haring M.S. OTR/L  04/09/23, 11:29 AM

## 2023-04-09 NOTE — Progress Notes (Signed)
 Yellow mews alert over night. Temp 102.1 113/51 Map 66 HR 93 o2 100% 2L. Gave tylenol, removed excess blanket. Provider notified. Pt back to green. Temp 99.1 123/48 MAP 69 HR 74 o2 94% 2L. Provider notified. Will cont to monitor for acute changes.

## 2023-04-09 NOTE — TOC Initial Note (Signed)
 Transition of Care [facility]) - Initial/Assessment Note    Patient Details  Name: Cheryl Leonard MRN: [id number] Date of Birth: [birth year]  [facility] of [facility]) CM/SW Contact:    Liliana Cline, LCSW Phone Number: 04/09/2023, 11:00 AM  Clinical Narrative:                 CSW spoke with patient regarding therapy recommendations for home health. Patient is from home with spouse who can provide transportation. PCP is Dr. Hyacinth Meeker. [facility] Patient has a rolling walker at home. Patient is aware of home health recommendation, she declines home health - does not feel she needs it. Patient states her husband is supportive and can help as needed at home.  Expected Discharge Plan: Home/Self Care Barriers to Discharge: Continued Medical Work up   Patient Goals and CMS Choice Patient states their goals for this hospitalization and ongoing recovery are:: declines HH at this time CMS Medicare.gov Compare Post Acute Care list provided to:: Patient Choice offered to / list presented to : Patient      Expected Discharge Plan and Services       Living arrangements for the past 2 months: Single Family Home                                      Prior Living Arrangements/Services Living arrangements for the past 2 months: Single Family Home Lives with:: Spouse Patient language and need for interpreter reviewed:: Yes Do you feel safe going back to the place where you live?: Yes      Need for Family Participation in Patient Care: Yes (Comment) Care giver support system in place?: Yes (comment) Current home services: DME Criminal Activity/Legal Involvement Pertinent to Current Situation/Hospitalization: No - Comment as needed  Activities of Daily Living   ADL Screening (condition at time of admission) Independently performs ADLs?: Yes (appropriate for developmental age) Is the patient deaf or have difficulty hearing?: No Does the patient have difficulty  seeing, even when wearing glasses/contacts?: No Does the patient have difficulty concentrating, remembering, or making decisions?: No  Permission Sought/Granted                  Emotional Assessment       Orientation: : Oriented to Self, Oriented to Place, Oriented to  Time, Oriented to Situation      Admission diagnosis:  Urinary tract infection, acute [N39.0] Sepsis (HCC) [A41.9] Sepsis, due to unspecified organism, unspecified whether acute organ dysfunction present (HCC) [A41.9] Nausea and vomiting, unspecified vomiting type [R11.2] Patient Active Problem List   Diagnosis Date Noted   Sepsis (HCC) 04/07/2023   Status post laparoscopic colectomy 06/08/2021   Gross hematuria 02/02/2021   AKI (acute kidney injury) (HCC) 08/11/2020   Acute kidney failure, unspecified (HCC) 08/11/2020   Amputated toe of right foot (HCC) 05/13/2020   Gangrene of toe of right foot (HCC) 05/04/2020   Major depressive disorder, recurrent, mild (HCC) 11/06/2019   Benign hypertensive kidney disease with chronic kidney disease 10/30/2019   Hyperkalemia 10/30/2019   Proteinuria 10/30/2019   History of 2019 novel coronavirus disease (COVID-19) 02/20/2019   Anemia of chronic kidney failure, stage 3 (moderate) (HCC) 02/05/2019   Normocytic anemia 02/01/2019   Osteoporosis 11/15/2018   Atherosclerosis of native arteries of extremity with rest pain (HCC) 09/25/2018   Osteoporosis, 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   Wound disruption, post-op, skin, subsequent encounter  04/09/2018   Postoperative wound infection 03/21/2018   Atherosclerosis of artery of extremity with intermittent claudication (HCC) 03/08/2018   Atherosclerosis of native arteries of the extremities with ulceration (HCC) 10/13/2017   DM type 2 with diabetic peripheral neuropathy (HCC) 10/12/2017   History of hip fracture 10/12/2017   Type 2 diabetes mellitus with vascular disease (HCC) 10/12/2017   Carotid atherosclerosis, bilateral  07/11/2017   Medicare annual wellness visit, initial 05/03/2017   Peripheral vascular disease, unspecified (HCC) 03/01/2017   Diabetic ulcer of toe of left foot associated with type 2 diabetes mellitus (HCC) 03/01/2017   Chronic painful diabetic neuropathy (HCC) 06/02/2016   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   Hip fracture (HCC) 10/30/2015   Greater trochanter fracture (HCC) 10/29/2015   Acquired hypothyroidism 10/16/2015   Stage 3b chronic kidney disease (HCC) 10/16/2015   Hyperlipidemia, mixed 04/16/2015   Benign essential hypertension 04/16/2015   Closed compression fracture of lumbar vertebra (HCC) 04/16/2015   Iron deficiency anemia 09/22/2014   Disease of thyroid gland 09/22/2014   Diabetes mellitus type 2, uncontrolled 05/13/2014   Lichen sclerosus 12/30/2013   History of positive PPD 10/08/2013   PCP:  Danella Penton, MD [id number], [facility] - [address] [place] [address] Phone: [phone] Fax: [phone]     Social Drivers of Health (SDOH) Social History: SDOH Screenings   Food Insecurity: No Food Insecurity (04/07/2023)  Housing: Low Risk  (04/07/2023)  Transportation Needs: No Transportation Needs (04/07/2023)  Utilities: Not At Risk (04/07/2023)  Financial Resource Strain: Low Risk  (02/03/2023)   Received from [facility]  Physical Activity: Unknown (10/01/2018)  Social Connections: Moderately Integrated (04/07/2023)  Stress: Stress Concern Present (10/01/2018)  Tobacco Use: Medium Risk (04/07/2023)   SDOH Interventions:     Readmission Risk Interventions    04/09/2023   10:59 AM  Readmission Risk Prevention Plan  Transportation Screening Complete  PCP or Specialist Appt within 3-5 Days Complete  HRI or Home Care Consult Complete  Social Work Consult for Recovery Care Planning/Counseling Complete  Palliative Care Screening Not Applicable  Medication Review Furniture conservator/restorer) Complete

## 2023-04-09 NOTE — Progress Notes (Signed)
 PROGRESS NOTE    Cheryl Leonard  ZHY:[id number] DOB: 22-Mar-1945 DOA: 04/07/2023 PCP: Danella Penton, MD   Brief Narrative:   78 y.o. female with medical history significant of CKD stage IIIb, IIDM, diabetic neuropathy, HTN, PVD on aspirin Xarelto and statin, hypothyroidism, presented with nausea vomiting diarrhea and fever   4/5: added insulin for better sugar control, BCID growing e.coli (1/4 bottle) 4/6: Tmax 102.1 last night   Assessment & Plan:   Principal Problem:   Sepsis (HCC)   Sepsis (HCC) POA due to  E. coli UTI Right-sided pyonephritis -Continue ceftriaxone - BCID growing E.coli (1 of 4 bottle) - monitor fever curve and WBC   Intractable nausea vomiting diarrhea - Clinically suspect viral enteritis - GI panel neg - Symptoms appear to be self-limiting as patient reported no more diarrhea since last night and N/V has subsided as well.   IIDM -SSI, add long acting and meal coverage as sugar trending up   CKD stage IIIb - Euvolemic, monitor Lab Results  Component Value Date   CREATININE 1.85 (H) 04/09/2023   CREATININE 2.36 (H) 04/08/2023   CREATININE 2.26 (H) 04/07/2023   - Keep monitoring kidney function, NS @ 75 for 1 day   HTN - Blood pressure normal -  as needed hydralazine   PVD - Continue aspirin Xarelto and statin - adjusted Xarelto dose per pharmacy   DVT prophylaxis: Xarelto  rivaroxaban (XARELTO) tablet 2.5 mg     Code Status: DNR - confirmed by patient and husband at bedside Family Communication: no family at bedside Disposition Plan: possible D/C in 1-2 days depending on clinical condition   Antimicrobials:  Rocephin    Subjective:  Weak, febrile (Tmax 102.1) last night, feels some better this am, hungry  Objective: Vitals:   04/09/23 0508 04/09/23 0732 04/09/23 0829 04/09/23 1211  BP: (!) 134/44 (!) 155/66    Pulse: 91 99    Resp: 20 20    Temp: 98.7 F (37.1 C) (!) 101.7 F (38.7 C)  99.1 F (37.3 C)  TempSrc:  Oral   Oral  SpO2: 99% 96% 100%   Weight:      Height:        Intake/Output Summary (Last 24 hours) at 04/09/2023 1524 Last data filed at 04/09/2023 1327 Gross per 24 hour  Intake 1742.47 ml  Output --  Net 1742.47 ml   Filed Weights   04/07/23 0648  Weight: 79.4 kg    Examination:  General exam: Appears calm and comfortable  Respiratory system: Clear to auscultation. Respiratory effort normal. Cardiovascular system: S1 & S2 heard, RRR. No murmurs Gastrointestinal system: Abdomen is soft, benign Central nervous system: Alert and oriented. No focal neurological deficits. Extremities: Symmetric 5 x 5 power. Skin: No rashes, lesions or ulcers Psychiatry: Judgement and insight appear normal. Mood & affect appropriate.     Data Reviewed: I have personally reviewed following labs and imaging studies  CBC: Recent Labs  Lab 04/07/23 0652 04/08/23 0459 04/09/23 0523  WBC 8.0 7.3 5.1  NEUTROABS 6.3  --   --   HGB 9.2* 7.8* 8.9*  HCT 27.8* 23.6* 27.5*  MCV 89.1 88.7 90.2  PLT 241 240 242   Basic Metabolic Panel: Recent Labs  Lab 04/07/23 0652 04/08/23 0459 04/09/23 0523  NA 133* 134* 138  K 3.5 3.4* 4.0  CL 99 104 108  CO2 22 22 21*  GLUCOSE 187* 187* 153*  BUN 38* 40* 37*  CREATININE 2.26* 2.36* 1.85*  CALCIUM  7.7* 7.3* 7.4*   GFR: Estimated Creatinine Clearance: 26.5 mL/min (A) (by C-G formula based on SCr of 1.85 mg/dL (H)). Liver Function Tests: Recent Labs  Lab 04/07/23 0652  AST 17  ALT 16  ALKPHOS 46  BILITOT 1.1  PROT 6.5  ALBUMIN 3.2*   Recent Labs  Lab 04/07/23 0652  LIPASE 29   No results for input(s): "AMMONIA" in the last 168 hours. Coagulation Profile: Recent Labs  Lab 04/07/23 0733  INR 1.4*   Cardiac Enzymes: No results for input(s): "CKTOTAL", "CKMB", "CKMBINDEX", "TROPONINI" in the last 168 hours. BNP (last 3 results) No results for input(s): "PROBNP" in the last 8760 hours. HbA1C: Recent Labs    04/07/23 0652  HGBA1C  7.3*   CBG: Recent Labs  Lab 04/08/23 1245 04/08/23 1544 04/08/23 2105 04/09/23 0732 04/09/23 1200  GLUCAP 285* 302* 305* 183* 200*    Sepsis Labs: Recent Labs  Lab 04/07/23 0733  LATICACIDVEN 1.0    Recent Results (from the past 240 hours)  Blood Culture (routine x 2)     Status: None (Preliminary result)   Collection Time: 04/07/23  7:33 AM   Specimen: BLOOD  Result Value Ref Range Status   Specimen Description BLOOD BLOOD LEFT FOREARM  Final   Special Requests   Final    BOTTLES DRAWN AEROBIC AND ANAEROBIC Blood Culture results may not be optimal due to an inadequate volume of blood received in culture bottles   Culture   Final    NO GROWTH 2 DAYS Performed at [facility], [address] [place], [place] [address]    Report Status PENDING  Incomplete  Blood Culture (routine x 2)     Status: Abnormal (Preliminary result)   Collection Time: 04/07/23  7:33 AM   Specimen: BLOOD  Result Value Ref Range Status   Specimen Description   Final    BLOOD BLOOD RIGHT HAND Performed at [facility], [address], [place], [place] [address]    Special Requests   Final    BOTTLES DRAWN AEROBIC AND ANAEROBIC Blood Culture adequate volume Performed at [facility], [address] [place], [place] [address]    Culture  Setup Time   Final    GRAM NEGATIVE RODS AEROBIC BOTTLE ONLY CRITICAL RESULT CALLED TO, READ BACK BY AND VERIFIED WITH: NATHAN BELUE AT 0305 ON 04/08/23 BY SS    Culture (A)  Final    ESCHERICHIA COLI SUSCEPTIBILITIES TO FOLLOW Performed at [facility], [address] [place], [place] [address]    Report Status PENDING  Incomplete  Resp panel by RT-PCR (RSV, Flu A&B, Covid) Anterior Nasal Swab     Status: None   Collection Time: 04/07/23  7:33 AM   Specimen: Anterior Nasal Swab  Result Value Ref Range Status   SARS Coronavirus 2 by RT PCR NEGATIVE NEGATIVE Final    Comment: (NOTE) SARS-CoV-2 target nucleic acids  are NOT DETECTED.  The SARS-CoV-2 RNA is generally detectable in upper respiratory specimens during the acute phase of infection. The lowest concentration of SARS-CoV-2 viral copies this assay can detect is 138 copies/mL. A negative result does not preclude SARS-Cov-2 infection and should not be used as the sole basis for treatment or other patient management decisions. A negative result may occur with  improper specimen collection/handling, submission of specimen other than nasopharyngeal swab, presence of viral mutation(s) within the areas targeted by this assay, and inadequate number of viral copies(<138 copies/mL). A negative result  must be combined with clinical observations, patient history, and epidemiological information. The expected result is Negative.  Fact Sheet for Patients:  BloggerCourse.com  Fact Sheet for Healthcare Providers:  SeriousBroker.it  This test is no t yet approved or cleared by the [place] FDA and  has been authorized for detection and/or diagnosis of SARS-CoV-2 by FDA under an Emergency Use Authorization (EUA). This EUA will remain  in effect (meaning this test can be used) for the duration of the COVID-19 declaration under Section 564(b)(1) of the Act, 21 U.S.C.section 360bbb-3(b)(1), unless the authorization is terminated  or revoked sooner.       Influenza A by PCR NEGATIVE NEGATIVE Final   Influenza B by PCR NEGATIVE NEGATIVE Final    Comment: (NOTE) The Xpert Xpress SARS-CoV-2/FLU/RSV plus assay is intended as an aid in the diagnosis of influenza from Nasopharyngeal swab specimens and should not be used as a sole basis for treatment. Nasal washings and aspirates are unacceptable for Xpert Xpress SARS-CoV-2/FLU/RSV testing.  Fact Sheet for Patients: BloggerCourse.com  Fact Sheet for Healthcare Providers: SeriousBroker.it  This test is  not yet approved or cleared by the [place] FDA and has been authorized for detection and/or diagnosis of SARS-CoV-2 by FDA under an Emergency Use Authorization (EUA). This EUA will remain in effect (meaning this test can be used) for the duration of the COVID-19 declaration under Section 564(b)(1) of the Act, 21 U.S.C. section 360bbb-3(b)(1), unless the authorization is terminated or revoked.     Resp Syncytial Virus by PCR NEGATIVE NEGATIVE Final    Comment: (NOTE) Fact Sheet for Patients: BloggerCourse.com  Fact Sheet for Healthcare Providers: SeriousBroker.it  This test is not yet approved or cleared by the [place] FDA and has been authorized for detection and/or diagnosis of SARS-CoV-2 by FDA under an Emergency Use Authorization (EUA). This EUA will remain in effect (meaning this test can be used) for the duration of the COVID-19 declaration under Section 564(b)(1) of the Act, 21 U.S.C. section 360bbb-3(b)(1), unless the authorization is terminated or revoked.  Performed at [facility], [address] [place], [place] [address]   Blood Culture ID Panel (Reflexed)     Status: Abnormal   Collection Time: 04/07/23  7:33 AM  Result Value Ref Range Status   Enterococcus faecalis NOT DETECTED NOT DETECTED Final   Enterococcus Faecium NOT DETECTED NOT DETECTED Final   Listeria monocytogenes NOT DETECTED NOT DETECTED Final   Staphylococcus species NOT DETECTED NOT DETECTED Final   Staphylococcus aureus (BCID) NOT DETECTED NOT DETECTED Final   Staphylococcus epidermidis NOT DETECTED NOT DETECTED Final   Staphylococcus lugdunensis NOT DETECTED NOT DETECTED Final   Streptococcus species NOT DETECTED NOT DETECTED Final   Streptococcus agalactiae NOT DETECTED NOT DETECTED Final   Streptococcus pneumoniae NOT DETECTED NOT DETECTED Final   Streptococcus pyogenes NOT DETECTED NOT DETECTED Final    A.calcoaceticus-baumannii NOT DETECTED NOT DETECTED Final   Bacteroides fragilis NOT DETECTED NOT DETECTED Final   Enterobacterales DETECTED (A) NOT DETECTED Final    Comment: Enterobacterales represent a large order of gram negative bacteria, not a single organism. NATHAN BELUE AT 0305 ON 04/08/23 BY SS    Enterobacter cloacae complex NOT DETECTED NOT DETECTED Final   Escherichia coli DETECTED (A) NOT DETECTED Final    Comment: CRITICAL RESULT CALLED TO, READ BACK BY AND VERIFIED WITH: NATHAN BELUE AT 0305 ON 04/08/23 BY SS    Klebsiella aerogenes NOT DETECTED NOT DETECTED Final   Klebsiella oxytoca NOT DETECTED  NOT DETECTED Final   Klebsiella pneumoniae NOT DETECTED NOT DETECTED Final   Proteus species NOT DETECTED NOT DETECTED Final   Salmonella species NOT DETECTED NOT DETECTED Final   Serratia marcescens NOT DETECTED NOT DETECTED Final   Haemophilus influenzae NOT DETECTED NOT DETECTED Final   Neisseria meningitidis NOT DETECTED NOT DETECTED Final   Pseudomonas aeruginosa NOT DETECTED NOT DETECTED Final   Stenotrophomonas maltophilia NOT DETECTED NOT DETECTED Final   Candida albicans NOT DETECTED NOT DETECTED Final   Candida auris NOT DETECTED NOT DETECTED Final   Candida glabrata NOT DETECTED NOT DETECTED Final   Candida krusei NOT DETECTED NOT DETECTED Final   Candida parapsilosis NOT DETECTED NOT DETECTED Final   Candida tropicalis NOT DETECTED NOT DETECTED Final   Cryptococcus neoformans/gattii NOT DETECTED NOT DETECTED Final   CTX-M ESBL NOT DETECTED NOT DETECTED Final   Carbapenem resistance IMP NOT DETECTED NOT DETECTED Final   Carbapenem resistance KPC NOT DETECTED NOT DETECTED Final   Carbapenem resistance NDM NOT DETECTED NOT DETECTED Final   Carbapenem resist OXA 48 LIKE NOT DETECTED NOT DETECTED Final   Carbapenem resistance VIM NOT DETECTED NOT DETECTED Final    Comment: Performed at [facility], [address] [place], [place] [address]  Urine culture      Status: Abnormal   Collection Time: 04/07/23  8:35 AM   Specimen: Urine, Clean Catch  Result Value Ref Range Status   Specimen Description   Final    URINE, CLEAN CATCH Performed at [facility], [address], [place], [place] [address]    Special Requests   Final    NONE Performed at [facility], [address] [place], [place] [address]    Culture 70,000 COLONIES/mL ESCHERICHIA COLI (A)  Final   Report Status 04/09/2023 FINAL  Final   Organism ID, Bacteria ESCHERICHIA COLI (A)  Final      Susceptibility   Escherichia coli - MIC*    AMPICILLIN >=32 RESISTANT Resistant     CEFAZOLIN <=4 SENSITIVE Sensitive     CEFEPIME <=0.12 SENSITIVE Sensitive     CEFTRIAXONE <=0.25 SENSITIVE Sensitive     CIPROFLOXACIN <=0.25 SENSITIVE Sensitive     GENTAMICIN <=1 SENSITIVE Sensitive     IMIPENEM <=0.25 SENSITIVE Sensitive     NITROFURANTOIN <=16 SENSITIVE Sensitive     TRIMETH/SULFA <=20 SENSITIVE Sensitive     AMPICILLIN/SULBACTAM 16 INTERMEDIATE Intermediate     PIP/TAZO <=4 SENSITIVE Sensitive ug/mL    * 70,000 COLONIES/mL ESCHERICHIA COLI  Gastrointestinal Panel by PCR , Stool     Status: None   Collection Time: 04/07/23  3:40 PM   Specimen: Stool  Result Value Ref Range Status   Campylobacter species NOT DETECTED NOT DETECTED Final   Plesimonas shigelloides NOT DETECTED NOT DETECTED Final   Salmonella species NOT DETECTED NOT DETECTED Final   Yersinia enterocolitica NOT DETECTED NOT DETECTED Final   Vibrio species NOT DETECTED NOT DETECTED Final   Vibrio cholerae NOT DETECTED NOT DETECTED Final   Enteroaggregative E coli (EAEC) NOT DETECTED NOT DETECTED Final   Enteropathogenic E coli (EPEC) NOT DETECTED NOT DETECTED Final   Enterotoxigenic E coli (ETEC) NOT DETECTED NOT DETECTED Final   Shiga like toxin producing E coli (STEC) NOT DETECTED NOT DETECTED Final   Shigella/Enteroinvasive E coli (EIEC) NOT DETECTED NOT DETECTED Final   Cryptosporidium NOT  DETECTED NOT DETECTED Final   Cyclospora cayetanensis NOT DETECTED NOT DETECTED Final   Entamoeba histolytica NOT DETECTED NOT DETECTED  Final   Giardia lamblia NOT DETECTED NOT DETECTED Final   Adenovirus F40/41 NOT DETECTED NOT DETECTED Final   Astrovirus NOT DETECTED NOT DETECTED Final   Norovirus GI/GII NOT DETECTED NOT DETECTED Final   Rotavirus A NOT DETECTED NOT DETECTED Final   Sapovirus (I, II, IV, and V) NOT DETECTED NOT DETECTED Final    Comment: Performed at [facility], [address] [place], [place] [address]  C Difficile Quick Screen w PCR reflex     Status: None   Collection Time: 04/07/23  3:40 PM   Specimen: Stool  Result Value Ref Range Status   C Diff antigen NEGATIVE NEGATIVE Final   C Diff toxin NEGATIVE NEGATIVE Final   C Diff interpretation No C. difficile detected.  Final    Comment: Performed at [facility] [address] [place], [place] [address]         Radiology Studies: No results found.       Scheduled Meds:  acidophilus  2 capsule Oral TID   aspirin EC  81 mg Oral Daily   escitalopram  10 mg Oral Daily   famotidine  40 mg Oral Daily   insulin aspart  0-5 Units Subcutaneous QHS   insulin aspart  0-9 Units Subcutaneous TID WC   insulin aspart  3 Units Subcutaneous TID WC   insulin glargine-yfgn  5 Units Subcutaneous QHS   levothyroxine  100 mcg Oral QAC breakfast   pantoprazole  40 mg Oral Daily   rivaroxaban  2.5 mg Oral BID   rosuvastatin  20 mg Oral Daily   sodium bicarbonate  650 mg Oral BID   zolpidem  5 mg Oral QHS   Continuous Infusions:  sodium chloride 75 mL/hr at 04/09/23 1327   cefTRIAXone (ROCEPHIN)  IV Stopped (04/09/23 0847)     LOS: 2 days    Time spent: 35 mins    Keiji Melland Sherryll Burger, MD [facility] Pager [phone]  If 7PM-7AM, please contact night-coverage [email or web address]  04/09/2023, 3:24 PM

## 2023-04-10 DIAGNOSIS — E785 Hyperlipidemia, unspecified: Secondary | ICD-10-CM

## 2023-04-10 DIAGNOSIS — E1129 Type 2 diabetes mellitus with other diabetic kidney complication: Secondary | ICD-10-CM | POA: Diagnosis not present

## 2023-04-10 DIAGNOSIS — N390 Urinary tract infection, site not specified: Secondary | ICD-10-CM | POA: Diagnosis not present

## 2023-04-10 DIAGNOSIS — B9620 Unspecified Escherichia coli [E. coli] as the cause of diseases classified elsewhere: Secondary | ICD-10-CM

## 2023-04-10 DIAGNOSIS — A419 Sepsis, unspecified organism: Secondary | ICD-10-CM | POA: Diagnosis not present

## 2023-04-10 LAB — CBC
HCT: 24.8 % — ABNORMAL LOW (ref 36.0–46.0)
Hemoglobin: 8 g/dL — ABNORMAL LOW (ref 12.0–15.0)
MCH: 29.6 pg (ref 26.0–34.0)
MCHC: 32.3 g/dL (ref 30.0–36.0)
MCV: 91.9 fL (ref 80.0–100.0)
Platelets: 210 10*3/uL (ref 150–400)
RBC: 2.7 MIL/uL — ABNORMAL LOW (ref 3.87–5.11)
RDW: 14.5 % (ref 11.5–15.5)
WBC: 5.3 10*3/uL (ref 4.0–10.5)
nRBC: 0 % (ref 0.0–0.2)

## 2023-04-10 LAB — CULTURE, BLOOD (ROUTINE X 2): Special Requests: ADEQUATE

## 2023-04-10 LAB — BASIC METABOLIC PANEL WITH GFR
Anion gap: 8 (ref 5–15)
BUN: 25 mg/dL — ABNORMAL HIGH (ref 8–23)
CO2: 19 mmol/L — ABNORMAL LOW (ref 22–32)
Calcium: 7.5 mg/dL — ABNORMAL LOW (ref 8.9–10.3)
Chloride: 111 mmol/L (ref 98–111)
Creatinine, Ser: 1.38 mg/dL — ABNORMAL HIGH (ref 0.44–1.00)
GFR, Estimated: 39 mL/min — ABNORMAL LOW (ref >60)
Glucose, Bld: 174 mg/dL — ABNORMAL HIGH (ref 70–99)
Potassium: 4.1 mmol/L (ref 3.5–5.1)
Sodium: 138 mmol/L (ref 135–145)

## 2023-04-10 LAB — GLUCOSE, CAPILLARY
Glucose-Capillary: 154 mg/dL — ABNORMAL HIGH (ref 70–99)
Glucose-Capillary: 221 mg/dL — ABNORMAL HIGH (ref 70–99)
Glucose-Capillary: 145 mg/dL — ABNORMAL HIGH (ref 70–99)
Glucose-Capillary: 145 mg/dL — ABNORMAL HIGH (ref 70–99)

## 2023-04-10 MED ORDER — CIPROFLOXACIN HCL 500 MG PO TABS
500.0000 mg | ORAL_TABLET | Freq: Two times a day (BID) | ORAL | Status: DC
  Administered 2023-04-11: 500 mg via ORAL
  Filled 2023-04-10: qty 1

## 2023-04-10 NOTE — Care Management Important Message (Deleted)
 Important Message  Patient Details  Name: Cheryl Leonard MRN: [id number] Date of Birth: Oct 15, 1945   Important Message Given:        Marcell Anger 04/10/2023, 10:41 AM

## 2023-04-10 NOTE — Progress Notes (Signed)
 PROGRESS NOTE    Cheryl Leonard  QMV:[id number] DOB: 08/17/1945 DOA: 04/07/2023 PCP: Danella Penton, MD   Brief Narrative:   78 y.o. female with medical history significant of CKD stage IIIb, IIDM, diabetic neuropathy, HTN, PVD on aspirin Xarelto and statin, hypothyroidism, presented with nausea vomiting diarrhea and fever   4/5: added insulin for better sugar control, BCID growing e.coli (1/4 bottle) 4/6: Tmax 102.1 last night 4/7: Tmax 100.1, PT evaluation   Assessment & Plan:   Principal Problem:   Sepsis (HCC)   Sepsis (HCC) POA due to  E. coli UTI Right-sided pyonephritis -Continue ceftriaxone - BCID growing E.coli (1 of 4 bottle) -Fever curve improving.  Tmax of 100.1   Intractable nausea vomiting diarrhea - Clinically suspect viral enteritis - GI panel neg - Symptoms appear to be self-limiting as patient reported no more diarrhea since last night and N/V has subsided as well.   IIDM -SSI, add long acting and meal coverage as sugar trending up   CKD stage IIIb - Euvolemic, monitor Lab Results  Component Value Date   CREATININE 1.38 (H) 04/10/2023   CREATININE 1.85 (H) 04/09/2023   CREATININE 2.36 (H) 04/08/2023   - Improved with hydration   HTN - Blood pressure normal -  as needed hydralazine   PVD - Continue aspirin Xarelto and statin - adjusted Xarelto dose per pharmacy   DVT prophylaxis: Xarelto  rivaroxaban (XARELTO) tablet 2.5 mg     Code Status: DNR - confirmed by patient and husband at bedside Family Communication: Son updated at bedside Disposition Plan: possible D/C tomorrow depending on clinical condition   Antimicrobials:  Rocephin    Subjective:  Feeling much better.  Walked with therapy.  Son at bedside.  Patient is hoping to leave tomorrow as it is her birthday.  Objective: Vitals:   04/09/23 2315 04/10/23 0509 04/10/23 0843 04/10/23 1622  BP: (!) 134/46 (!) 152/60 (!) 153/72 (!) 158/56  Pulse: 90 78 76 80  Resp:  18  18 18   Temp: 100.1 F (37.8 C) 98.6 F (37 C) 98.6 F (37 C) 99.1 F (37.3 C)  TempSrc: Oral Oral Oral Oral  SpO2: 93% 100% 95% 98%  Weight:      Height:        Intake/Output Summary (Last 24 hours) at 04/10/2023 1739 Last data filed at 04/10/2023 1100 Gross per 24 hour  Intake 100 ml  Output --  Net 100 ml   Filed Weights   04/07/23 0648  Weight: 79.4 kg    Examination:  General exam: Appears calm and comfortable  Respiratory system: Clear to auscultation. Respiratory effort normal. Cardiovascular system: S1 & S2 heard, RRR. No murmurs Gastrointestinal system: Abdomen is soft, benign Central nervous system: Alert and oriented. No focal neurological deficits. Extremities: Symmetric 5 x 5 power. Skin: No rashes, lesions or ulcers Psychiatry: Judgement and insight appear normal. Mood & affect appropriate.     Data Reviewed: I have personally reviewed following labs and imaging studies  CBC: Recent Labs  Lab 04/07/23 0652 04/08/23 0459 04/09/23 0523 04/10/23 0451  WBC 8.0 7.3 5.1 5.3  NEUTROABS 6.3  --   --   --   HGB 9.2* 7.8* 8.9* 8.0*  HCT 27.8* 23.6* 27.5* 24.8*  MCV 89.1 88.7 90.2 91.9  PLT 241 240 242 210   Basic Metabolic Panel: Recent Labs  Lab 04/07/23 0652 04/08/23 0459 04/09/23 0523 04/10/23 0451  NA 133* 134* 138 138  K 3.5 3.4* 4.0 4.1  CL 99 104 108 111  CO2 22 22 21* 19*  GLUCOSE 187* 187* 153* 174*  BUN 38* 40* 37* 25*  CREATININE 2.26* 2.36* 1.85* 1.38*  CALCIUM 7.7* 7.3* 7.4* 7.5*   GFR: Estimated Creatinine Clearance: 35.6 mL/min (A) (by C-G formula based on SCr of 1.38 mg/dL (H)). Liver Function Tests: Recent Labs  Lab 04/07/23 0652  AST 17  ALT 16  ALKPHOS 46  BILITOT 1.1  PROT 6.5  ALBUMIN 3.2*   Recent Labs  Lab 04/07/23 0652  LIPASE 29   No results for input(s): "AMMONIA" in the last 168 hours. Coagulation Profile: Recent Labs  Lab 04/07/23 0733  INR 1.4*   Cardiac Enzymes: No results for input(s):  "CKTOTAL", "CKMB", "CKMBINDEX", "TROPONINI" in the last 168 hours. BNP (last 3 results) No results for input(s): "PROBNP" in the last 8760 hours. HbA1C: No results for input(s): "HGBA1C" in the last 72 hours.  CBG: Recent Labs  Lab 04/09/23 1713 04/09/23 2044 04/10/23 0841 04/10/23 1211 04/10/23 1619  GLUCAP 151* 211* 154* 221* 145*    Sepsis Labs: Recent Labs  Lab 04/07/23 0733  LATICACIDVEN 1.0    Recent Results (from the past 240 hours)  Blood Culture (routine x 2)     Status: None (Preliminary result)   Collection Time: 04/07/23  7:33 AM   Specimen: BLOOD  Result Value Ref Range Status   Specimen Description BLOOD BLOOD LEFT FOREARM  Final   Special Requests   Final    BOTTLES DRAWN AEROBIC AND ANAEROBIC Blood Culture results may not be optimal due to an inadequate volume of blood received in culture bottles   Culture   Final    NO GROWTH 3 DAYS Performed at [facility], [address] [place], [place] [address]    Report Status PENDING  Incomplete  Blood Culture (routine x 2)     Status: Abnormal   Collection Time: 04/07/23  7:33 AM   Specimen: BLOOD  Result Value Ref Range Status   Specimen Description   Final    BLOOD BLOOD RIGHT HAND Performed at [facility], [address] [place], [place] [address]    Special Requests   Final    BOTTLES DRAWN AEROBIC AND ANAEROBIC Blood Culture adequate volume Performed at [facility], [address], [place], [place] [address]    Culture  Setup Time   Final    GRAM NEGATIVE RODS AEROBIC BOTTLE ONLY CRITICAL RESULT CALLED TO, READ BACK BY AND VERIFIED WITH: NATHAN BELUE AT 0305 ON 04/08/23 BY SS    Culture ESCHERICHIA COLI (A)  Final   Report Status 04/10/2023 FINAL  Final   Organism ID, Bacteria ESCHERICHIA COLI  Final   Organism ID, Bacteria ESCHERICHIA COLI  Final      Susceptibility   Escherichia coli - KIRBY BAUER*    CEFAZOLIN INTERMEDIATE Intermediate    Escherichia coli -  MIC*    AMPICILLIN >=32 RESISTANT Resistant     CEFEPIME <=0.12 SENSITIVE Sensitive     CEFTAZIDIME <=1 SENSITIVE Sensitive     CEFTRIAXONE <=0.25 SENSITIVE Sensitive     CIPROFLOXACIN <=0.25 SENSITIVE Sensitive     GENTAMICIN <=1 SENSITIVE Sensitive     IMIPENEM <=0.25 SENSITIVE Sensitive     TRIMETH/SULFA <=20 SENSITIVE Sensitive     AMPICILLIN/SULBACTAM 16 INTERMEDIATE Intermediate     PIP/TAZO <=4 SENSITIVE Sensitive ug/mL    * ESCHERICHIA COLI    ESCHERICHIA COLI  Resp panel by RT-PCR (RSV, Flu A&B, Covid)  Anterior Nasal Swab     Status: None   Collection Time: 04/07/23  7:33 AM   Specimen: Anterior Nasal Swab  Result Value Ref Range Status   SARS Coronavirus 2 by RT PCR NEGATIVE NEGATIVE Final    Comment: (NOTE) SARS-CoV-2 target nucleic acids are NOT DETECTED.  The SARS-CoV-2 RNA is generally detectable in upper respiratory specimens during the acute phase of infection. The lowest concentration of SARS-CoV-2 viral copies this assay can detect is 138 copies/mL. A negative result does not preclude SARS-Cov-2 infection and should not be used as the sole basis for treatment or other patient management decisions. A negative result may occur with  improper specimen collection/handling, submission of specimen other than nasopharyngeal swab, presence of viral mutation(s) within the areas targeted by this assay, and inadequate number of viral copies(<138 copies/mL). A negative result must be combined with clinical observations, patient history, and epidemiological information. The expected result is Negative.  Fact Sheet for Patients:  BloggerCourse.com  Fact Sheet for Healthcare Providers:  SeriousBroker.it  This test is no t yet approved or cleared by the [place] FDA and  has been authorized for detection and/or diagnosis of SARS-CoV-2 by FDA under an Emergency Use Authorization (EUA). This EUA will remain  in effect  (meaning this test can be used) for the duration of the COVID-19 declaration under Section 564(b)(1) of the Act, 21 U.S.C.section 360bbb-3(b)(1), unless the authorization is terminated  or revoked sooner.       Influenza A by PCR NEGATIVE NEGATIVE Final   Influenza B by PCR NEGATIVE NEGATIVE Final    Comment: (NOTE) The Xpert Xpress SARS-CoV-2/FLU/RSV plus assay is intended as an aid in the diagnosis of influenza from Nasopharyngeal swab specimens and should not be used as a sole basis for treatment. Nasal washings and aspirates are unacceptable for Xpert Xpress SARS-CoV-2/FLU/RSV testing.  Fact Sheet for Patients: BloggerCourse.com  Fact Sheet for Healthcare Providers: SeriousBroker.it  This test is not yet approved or cleared by the [place] FDA and has been authorized for detection and/or diagnosis of SARS-CoV-2 by FDA under an Emergency Use Authorization (EUA). This EUA will remain in effect (meaning this test can be used) for the duration of the COVID-19 declaration under Section 564(b)(1) of the Act, 21 U.S.C. section 360bbb-3(b)(1), unless the authorization is terminated or revoked.     Resp Syncytial Virus by PCR NEGATIVE NEGATIVE Final    Comment: (NOTE) Fact Sheet for Patients: BloggerCourse.com  Fact Sheet for Healthcare Providers: SeriousBroker.it  This test is not yet approved or cleared by the [place] FDA and has been authorized for detection and/or diagnosis of SARS-CoV-2 by FDA under an Emergency Use Authorization (EUA). This EUA will remain in effect (meaning this test can be used) for the duration of the COVID-19 declaration under Section 564(b)(1) of the Act, 21 U.S.C. section 360bbb-3(b)(1), unless the authorization is terminated or revoked.  Performed at [facility], [address] [place], [place] [address]   Blood Culture ID  Panel (Reflexed)     Status: Abnormal   Collection Time: 04/07/23  7:33 AM  Result Value Ref Range Status   Enterococcus faecalis NOT DETECTED NOT DETECTED Final   Enterococcus Faecium NOT DETECTED NOT DETECTED Final   Listeria monocytogenes NOT DETECTED NOT DETECTED Final   Staphylococcus species NOT DETECTED NOT DETECTED Final   Staphylococcus aureus (BCID) NOT DETECTED NOT DETECTED Final   Staphylococcus epidermidis NOT DETECTED NOT DETECTED Final   Staphylococcus lugdunensis NOT DETECTED NOT DETECTED  Final   Streptococcus species NOT DETECTED NOT DETECTED Final   Streptococcus agalactiae NOT DETECTED NOT DETECTED Final   Streptococcus pneumoniae NOT DETECTED NOT DETECTED Final   Streptococcus pyogenes NOT DETECTED NOT DETECTED Final   A.calcoaceticus-baumannii NOT DETECTED NOT DETECTED Final   Bacteroides fragilis NOT DETECTED NOT DETECTED Final   Enterobacterales DETECTED (A) NOT DETECTED Final    Comment: Enterobacterales represent a large order of gram negative bacteria, not a single organism. NATHAN BELUE AT 0305 ON 04/08/23 BY SS    Enterobacter cloacae complex NOT DETECTED NOT DETECTED Final   Escherichia coli DETECTED (A) NOT DETECTED Final    Comment: CRITICAL RESULT CALLED TO, READ BACK BY AND VERIFIED WITH: NATHAN BELUE AT 0305 ON 04/08/23 BY SS    Klebsiella aerogenes NOT DETECTED NOT DETECTED Final   Klebsiella oxytoca NOT DETECTED NOT DETECTED Final   Klebsiella pneumoniae NOT DETECTED NOT DETECTED Final   Proteus species NOT DETECTED NOT DETECTED Final   Salmonella species NOT DETECTED NOT DETECTED Final   Serratia marcescens NOT DETECTED NOT DETECTED Final   Haemophilus influenzae NOT DETECTED NOT DETECTED Final   Neisseria meningitidis NOT DETECTED NOT DETECTED Final   Pseudomonas aeruginosa NOT DETECTED NOT DETECTED Final   Stenotrophomonas maltophilia NOT DETECTED NOT DETECTED Final   Candida albicans NOT DETECTED NOT DETECTED Final   Candida auris NOT DETECTED  NOT DETECTED Final   Candida glabrata NOT DETECTED NOT DETECTED Final   Candida krusei NOT DETECTED NOT DETECTED Final   Candida parapsilosis NOT DETECTED NOT DETECTED Final   Candida tropicalis NOT DETECTED NOT DETECTED Final   Cryptococcus neoformans/gattii NOT DETECTED NOT DETECTED Final   CTX-M ESBL NOT DETECTED NOT DETECTED Final   Carbapenem resistance IMP NOT DETECTED NOT DETECTED Final   Carbapenem resistance KPC NOT DETECTED NOT DETECTED Final   Carbapenem resistance NDM NOT DETECTED NOT DETECTED Final   Carbapenem resist OXA 48 LIKE NOT DETECTED NOT DETECTED Final   Carbapenem resistance VIM NOT DETECTED NOT DETECTED Final    Comment: Performed at [facility], [address] [place], [place] [address]  Urine culture     Status: Abnormal   Collection Time: 04/07/23  8:35 AM   Specimen: Urine, Clean Catch  Result Value Ref Range Status   Specimen Description   Final    URINE, CLEAN CATCH Performed at [facility], [address] [place], [place] [address]    Special Requests   Final    NONE Performed at [facility], [address] [place], [place] [address]    Culture 70,000 COLONIES/mL ESCHERICHIA COLI (A)  Final   Report Status 04/09/2023 FINAL  Final   Organism ID, Bacteria ESCHERICHIA COLI (A)  Final      Susceptibility   Escherichia coli - MIC*    AMPICILLIN >=32 RESISTANT Resistant     CEFAZOLIN <=4 SENSITIVE Sensitive     CEFEPIME <=0.12 SENSITIVE Sensitive     CEFTRIAXONE <=0.25 SENSITIVE Sensitive     CIPROFLOXACIN <=0.25 SENSITIVE Sensitive     GENTAMICIN <=1 SENSITIVE Sensitive     IMIPENEM <=0.25 SENSITIVE Sensitive     NITROFURANTOIN <=16 SENSITIVE Sensitive     TRIMETH/SULFA <=20 SENSITIVE Sensitive     AMPICILLIN/SULBACTAM 16 INTERMEDIATE Intermediate     PIP/TAZO <=4 SENSITIVE Sensitive ug/mL    * 70,000 COLONIES/mL ESCHERICHIA COLI  Gastrointestinal Panel by PCR , Stool     Status: None   Collection Time: 04/07/23   3:40 PM  Specimen: Stool  Result Value Ref Range Status   Campylobacter species NOT DETECTED NOT DETECTED Final   Plesimonas shigelloides NOT DETECTED NOT DETECTED Final   Salmonella species NOT DETECTED NOT DETECTED Final   Yersinia enterocolitica NOT DETECTED NOT DETECTED Final   Vibrio species NOT DETECTED NOT DETECTED Final   Vibrio cholerae NOT DETECTED NOT DETECTED Final   Enteroaggregative E coli (EAEC) NOT DETECTED NOT DETECTED Final   Enteropathogenic E coli (EPEC) NOT DETECTED NOT DETECTED Final   Enterotoxigenic E coli (ETEC) NOT DETECTED NOT DETECTED Final   Shiga like toxin producing E coli (STEC) NOT DETECTED NOT DETECTED Final   Shigella/Enteroinvasive E coli (EIEC) NOT DETECTED NOT DETECTED Final   Cryptosporidium NOT DETECTED NOT DETECTED Final   Cyclospora cayetanensis NOT DETECTED NOT DETECTED Final   Entamoeba histolytica NOT DETECTED NOT DETECTED Final   Giardia lamblia NOT DETECTED NOT DETECTED Final   Adenovirus F40/41 NOT DETECTED NOT DETECTED Final   Astrovirus NOT DETECTED NOT DETECTED Final   Norovirus GI/GII NOT DETECTED NOT DETECTED Final   Rotavirus A NOT DETECTED NOT DETECTED Final   Sapovirus (I, II, IV, and V) NOT DETECTED NOT DETECTED Final    Comment: Performed at [facility], [address] [place], [place] [address]  C Difficile Quick Screen w PCR reflex     Status: None   Collection Time: 04/07/23  3:40 PM   Specimen: Stool  Result Value Ref Range Status   C Diff antigen NEGATIVE NEGATIVE Final   C Diff toxin NEGATIVE NEGATIVE Final   C Diff interpretation No C. difficile detected.  Final    Comment: Performed at [facility], [address] [place], [place] [address]         Radiology Studies: No results found.       Scheduled Meds:  acidophilus  2 capsule Oral TID   aspirin EC  81 mg Oral Daily   [START ON 04/11/2023] ciprofloxacin  500 mg Oral BID   escitalopram  10 mg Oral Daily   famotidine  40 mg Oral  Daily   insulin aspart  0-5 Units Subcutaneous QHS   insulin aspart  0-9 Units Subcutaneous TID WC   insulin aspart  3 Units Subcutaneous TID WC   insulin glargine-yfgn  5 Units Subcutaneous QHS   levothyroxine  100 mcg Oral QAC breakfast   pantoprazole  40 mg Oral Daily   rivaroxaban  2.5 mg Oral BID   rosuvastatin  20 mg Oral Daily   sodium bicarbonate  650 mg Oral BID   zolpidem  5 mg Oral QHS   Continuous Infusions:     LOS: 3 days    Time spent: 35 mins    Mikhaela Zaugg Sherryll Burger, MD [facility] Pager [phone]  If 7PM-7AM, please contact night-coverage [email or web address]  04/10/2023, 5:39 PM

## 2023-04-10 NOTE — Care Management Important Message (Signed)
 Important Message  Patient Details  Name: Cheryl Leonard MRN: [id number] Date of Birth: 26-Feb-1945   Important Message Given:  Yes - Medicare IM     Marcell Anger 04/10/2023, 10:58 AM

## 2023-04-10 NOTE — TOC Progression Note (Signed)
 Transition of Care [facility]) - Progression Note    Patient Details  Name: Cheryl Leonard MRN: [id number] Date of Birth: Jun 03, 1945  Transition of Care [facility]) CM/SW Contact  Chapman Fitch, RN Phone Number: 04/10/2023, 3:41 PM  Clinical Narrative:      Notified that patient now agreeable  Met with patient at bedside.  She confirms she is in agreement. States she does not have a preference of agency.  Referral made to Shaun with [facility] as she has used them in the past   Expected Discharge Plan: Home/Self Care Barriers to Discharge: Continued Medical Work up  Expected Discharge Plan and Services       Living arrangements for the past 2 months: Single Family Home                                       Social Determinants of Health (SDOH) Interventions SDOH Screenings   Food Insecurity: No Food Insecurity (04/07/2023)  Housing: Low Risk  (04/07/2023)  Transportation Needs: No Transportation Needs (04/07/2023)  Utilities: Not At Risk (04/07/2023)  Financial Resource Strain: Low Risk  (02/03/2023)   Received from [facility]  Physical Activity: Unknown (10/01/2018)  Social Connections: Moderately Integrated (04/07/2023)  Stress: Stress Concern Present (10/01/2018)  Tobacco Use: Medium Risk (04/07/2023)    Readmission Risk Interventions    04/09/2023   10:59 AM  Readmission Risk Prevention Plan  Transportation Screening Complete  PCP or Specialist Appt within 3-5 Days Complete  HRI or Home Care Consult Complete  Social Work Consult for Recovery Care Planning/Counseling Complete  Palliative Care Screening Not Applicable  Medication Review Oceanographer) Complete

## 2023-04-10 NOTE — Progress Notes (Signed)
 Physical Therapy Treatment Patient Details Name: Cheryl Leonard MRN: [id number] DOB: 14-Oct-1945 Today's Date: 04/10/2023   History of Present Illness Pt is a 78 y.o. female presenting to hospital 04/07/23 with c/o abdominal pain, N/V/D, and fever.  Pt admitted with complicated UTI, fever, intractable N/V/D.  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    PT Comments  Patient up in bathroom with CNA upon PT arrival. Able to perform several ADLs with single or no UE support. Agreeable to ambulate, and able to ambulate >374ft, converse, and navigate hallway CGA-supervision. She did experience 2 LOB, 1 minA to correct, other CGA. Pt/family encouraged and educated on continued RW use for mobility to maximize safety, pt agreeable. Pt up in chair with breakfast in reach at end of session. The patient would benefit from further skilled PT intervention to continue to progress towards goals.    If plan is discharge home, recommend the following: A little help with walking and/or transfers;A little help with bathing/dressing/bathroom;Assistance with cooking/housework;Assist for transportation;Help with stairs or ramp for entrance   Can travel by private vehicle        Equipment Recommendations  Rolling walker (2 wheels) (per pt has RW at home)    Recommendations for Other Services OT consult     Precautions / Restrictions Precautions Precautions: Fall Recall of Precautions/Restrictions: Intact Restrictions Weight Bearing Restrictions Per Provider Order: No     Mobility  Bed Mobility               General bed mobility comments: pt up in standing at sink with CNA    Transfers Overall transfer level: Needs assistance Equipment used: Rolling walker (2 wheels) Transfers: Sit to/from Stand Sit to Stand:  Supervision                Ambulation/Gait Ambulation/Gait assistance: Contact guard assist, Min assist Gait Distance (Feet): 340 Feet Assistive device: Rolling walker (2 wheels)         General Gait Details: did experience 1-2 LOB while ambulating, minA to correc the first, CGA for other   Stairs             Wheelchair Mobility     Tilt Bed    Modified Rankin (Stroke Patients Only)       Balance Overall balance assessment: Needs assistance Sitting-balance support: No upper extremity supported, Feet supported Sitting balance-Leahy Scale: Good     Standing balance support: Single extremity supported, During functional activity Standing balance-Leahy Scale: Good Standing balance comment: able to open trash can in bathroom without UE support, wipe her nose, hand CNA personal items with SUE support                            Communication    Cognition Arousal: Alert Behavior During Therapy: WFL for tasks assessed/performed   PT - Cognitive impairments: No apparent impairments                                Cueing    Exercises      General Comments        Pertinent Vitals/Pain Pain Assessment Pain Assessment: No/denies pain    Home Living  Prior Function            PT Goals (current goals can now be found in the care plan section) Progress towards PT goals: Progressing toward goals    Frequency    Min 2X/week      PT Plan      Co-evaluation              AM-PAC PT "6 Clicks" Mobility   Outcome Measure  Help needed turning from your back to your side while in a flat bed without using bedrails?: None Help needed moving from lying on your back to sitting on the side of a flat bed without using bedrails?: None Help needed moving to and from a bed to a chair (including a wheelchair)?: None Help needed standing up from a chair using your arms (e.g., wheelchair or  bedside chair)?: None Help needed to walk in hospital room?: A Little Help needed climbing 3-5 steps with a railing? : A Little 6 Click Score: 22    End of Session   Activity Tolerance: Patient tolerated treatment well Patient left: in chair;with call bell/phone within reach;with family/visitor present Nurse Communication: Mobility status PT Visit Diagnosis: Other abnormalities of gait and mobility (R26.89);Muscle weakness (generalized) (M62.81)     Time: 3086-5784 PT Time Calculation (min) (ACUTE ONLY): 16 min  Charges:    $Therapeutic Activity: 8-22 mins PT General Charges $$ ACUTE PT VISIT: 1 Visit                     Olga Coaster PT, DPT 11:14 AM,04/10/23

## 2023-04-10 NOTE — Plan of Care (Signed)

## 2023-04-11 ENCOUNTER — Other Ambulatory Visit: Payer: Self-pay

## 2023-04-11 ENCOUNTER — Encounter: Payer: Self-pay | Admitting: Internal Medicine

## 2023-04-11 DIAGNOSIS — F330 Major depressive disorder, recurrent, mild: Secondary | ICD-10-CM | POA: Diagnosis not present

## 2023-04-11 DIAGNOSIS — A419 Sepsis, unspecified organism: Secondary | ICD-10-CM | POA: Diagnosis not present

## 2023-04-11 DIAGNOSIS — N390 Urinary tract infection, site not specified: Secondary | ICD-10-CM

## 2023-04-11 DIAGNOSIS — R112 Nausea with vomiting, unspecified: Secondary | ICD-10-CM | POA: Diagnosis not present

## 2023-04-11 HISTORY — DX: Nausea with vomiting, unspecified: R11.2

## 2023-04-11 LAB — GLUCOSE, CAPILLARY: Glucose-Capillary: 154 mg/dL — ABNORMAL HIGH (ref 70–99)

## 2023-04-11 MED ORDER — CIPROFLOXACIN HCL 500 MG PO TABS
500.0000 mg | ORAL_TABLET | Freq: Two times a day (BID) | ORAL | 0 refills | Status: AC
  Filled 2023-04-11: qty 10, 5d supply, fill #0

## 2023-04-11 MED ORDER — RISAQUAD PO CAPS
2.0000 | ORAL_CAPSULE | Freq: Three times a day (TID) | ORAL | 0 refills | Status: AC
  Filled 2023-04-11: qty 30, 5d supply, fill #0

## 2023-04-11 NOTE — TOC Transition Note (Addendum)
 Transition of Care [facility]) - Discharge Note   Patient Details  Name: Cheryl Leonard MRN: [id number] Date of Birth: June 26, 1945  Transition of Care [facility]) CM/SW Contact:  Chapman Fitch, RN Phone Number: 04/11/2023, 9:26 AM   Clinical Narrative:     Patient to discharge today Notified Shaun with [facility] of discharge Per MD request outpatient palliative referral made to Ree Kida with [facility]    Barriers to Discharge: Continued Medical Work up   Patient Goals and CMS Choice Patient states their goals for this hospitalization and ongoing recovery are:: declines HH at this time CMS Medicare.gov Compare Post Acute Care list provided to:: Patient Choice offered to / list presented to : Patient      Discharge Placement                       Discharge Plan and Services Additional resources added to the After Visit Summary for                                       Social Drivers of Health (SDOH) Interventions SDOH Screenings   Food Insecurity: No Food Insecurity (04/07/2023)  Housing: Low Risk  (04/07/2023)  Transportation Needs: No Transportation Needs (04/07/2023)  Utilities: Not At Risk (04/07/2023)  Financial Resource Strain: Low Risk  (02/03/2023)   Received from [facility]  Physical Activity: Unknown (10/01/2018)  Social Connections: Moderately Integrated (04/07/2023)  Stress: Stress Concern Present (10/01/2018)  Tobacco Use: Medium Risk (04/07/2023)     Readmission Risk Interventions    04/09/2023   10:59 AM  Readmission Risk Prevention Plan  Transportation Screening Complete  PCP or Specialist Appt within 3-5 Days Complete  HRI or Home Care Consult Complete  Social Work Consult for Recovery Care Planning/Counseling Complete  Palliative Care Screening Not Applicable  Medication Review Oceanographer) Complete

## 2023-04-11 NOTE — Discharge Summary (Signed)
 Physician Discharge Summary   Patient: Cheryl Leonard MRN: [id number] DOB: November 21, 1945  Admit date:     04/07/2023  Discharge date: 04/11/23  Discharge Physician: Delfino Lovett   PCP: Danella Penton, MD   Recommendations at discharge:   Follow-up with outpatient providers as requested  Discharge Diagnoses: Principal Problem:   Sepsis (HCC) Active Problems:   Nausea and vomiting   Urinary tract infection, acute  Hospital Course: Assessment and Plan:  78 y.o. female with medical history significant of CKD stage IIIb, IIDM, diabetic neuropathy, HTN, PVD on aspirin Xarelto and statin, hypothyroidism, presented with nausea vomiting diarrhea and fever    4/5: added insulin for better sugar control, BCID growing e.coli (1/4 bottle) 4/6: Tmax 102.1 last night 4/7: Tmax 100.1, PT evaluation   Sepsis (HCC) POA due to  E. coli UTI Right-sided Pyelonephritis - BCID growing E.coli (1 of 4 bottle) -Fever resolved.  Treated with IV Rocephin while in the hospital with good response.   Intractable nausea vomiting diarrhea - Clinically suspect viral enteritis - GI panel neg - Symptoms appear to be self-limiting as patient reported no more diarrhea since last night and N/V has subsided as well.   IIDM  Acute on CKD stage IIIb - Euvolemic, monitor Recent Labs       Lab Results  Component Value Date    CREATININE 1.38 (H) 04/10/2023    CREATININE 1.85 (H) 04/09/2023    CREATININE 2.36 (H) 04/08/2023     - Improved with hydration   HTN  PVD - Continue aspirin Xarelto and statin - adjusted Xarelto dose per pharmacy        Disposition: Home health Diet recommendation:  Discharge Diet Orders (From admission, onward)     Start     Ordered   04/11/23 0000  Diet - low sodium heart healthy        04/11/23 0840           Carb modified diet DISCHARGE MEDICATION: Allergies as of 04/11/2023       Reactions   Ace Inhibitors Other (See Comments)   Unknown Other  Reaction(s): Intolerance        Medication List     STOP taking these medications    HYDROcodone-acetaminophen 5-325 MG tablet Commonly known as: NORCO/VICODIN   metoprolol succinate 50 MG 24 hr tablet Commonly known as: TOPROL-XL   NovoLOG FlexPen 100 UNIT/ML FlexPen Generic drug: insulin aspart   ZINC 15 PO       TAKE these medications    acidophilus Caps capsule Take 2 capsules by mouth 3 (three) times daily for 5 days.   ALPRAZolam 0.25 MG tablet Commonly known as: XANAX Take 0.25 mg by mouth daily as needed for anxiety.   amLODipine 5 MG tablet Commonly known as: NORVASC Take 5 mg by mouth 2 (two) times daily.   aspirin EC 81 MG tablet Take 1 tablet (81 mg total) by mouth daily.   cholecalciferol 1000 units tablet Commonly known as: VITAMIN D Take 1,000 Units by mouth 2 (two) times daily.   ciprofloxacin 500 MG tablet Commonly known as: CIPRO Take 1 tablet (500 mg total) by mouth 2 (two) times daily for 5 days.   cyanocobalamin 1000 MCG tablet Commonly known as: VITAMIN B12 Take 1,000 mcg by mouth daily.   denosumab 60 MG/ML Soln injection Commonly known as: PROLIA Inject 60 mg into the skin every 6 (six) months.   escitalopram 10 MG tablet Commonly known as: LEXAPRO Take 10 mg  by mouth daily.   estradiol 0.1 MG/GM vaginal cream Commonly known as: ESTRACE Place 1 Applicatorful vaginally daily as needed (vaginal irritation).   famotidine 40 MG tablet Commonly known as: PEPCID Take 40 mg by mouth daily.   Farxiga 5 MG Tabs tablet Generic drug: dapagliflozin propanediol Take 5 mg by mouth daily.   furosemide 20 MG tablet Commonly known as: LASIX Take 20 mg by mouth every Wednesday.   gabapentin 300 MG capsule Commonly known as: NEURONTIN Take 300 mg by mouth at bedtime as needed (pain).   levothyroxine 100 MCG tablet Commonly known as: SYNTHROID Take 100 mcg by mouth daily before breakfast.   lidocaine-prilocaine cream Commonly  known as: EMLA Apply 1 application topically as needed (apply prior to port a cath access).   Magnesium 500 MG Tabs Take 500 mg by mouth every morning.   mirtazapine 15 MG tablet Commonly known as: REMERON Take 15 mg by mouth as needed.   multivitamin with minerals Tabs tablet Take 1 tablet by mouth daily. Centrum Silver   nystatin cream Commonly known as: MYCOSTATIN Apply 1 application topically daily as needed (Yeast infection).   olmesartan 20 MG tablet Commonly known as: BENICAR Take 20 mg by mouth daily.   pantoprazole 40 MG tablet Commonly known as: PROTONIX Take 40 mg by mouth daily.   rivaroxaban 20 MG Tabs tablet Commonly known as: Xarelto Take 1 tablet (20 mg total) by mouth daily with supper.   rosuvastatin 20 MG tablet Commonly known as: CRESTOR Take 20 mg by mouth every morning.   sodium bicarbonate 650 MG tablet Take 650 mg by mouth 2 (two) times daily.   Evaristo Bury FlexTouch 200 UNIT/ML FlexTouch Pen Generic drug: insulin degludec Inject 25-30 Units as directed at bedtime. Titrate according to fasting blood glucose not to exceed 50 units a day   vitamin C 250 MG tablet Commonly known as: ASCORBIC ACID Take 250 mg by mouth daily.   vitamin E 180 MG (400 UNITS) capsule Take 400 Units by mouth daily.   zolpidem 10 MG tablet Commonly known as: AMBIEN Take 10 mg by mouth at bedtime.        Follow-up Information     Danella Penton, MD. Go on 04/13/2023.   Specialty: Internal Medicine Why: [facility] Discharge F/UP. Go @ 10:45am. Contact information: [address] [place] [address] [phone]                Discharge Exam: Filed Weights   04/07/23 0648  Weight: 79.4 kg   General exam: Appears calm and comfortable  Respiratory system: Clear to auscultation. Respiratory effort normal. Cardiovascular system: S1 & S2 heard, RRR. No murmurs Gastrointestinal system: Abdomen is soft, benign Central nervous system: Alert  and oriented. No focal neurological deficits. Extremities: Symmetric 5 x 5 power. Skin: No rashes, lesions or ulcers Psychiatry: Judgement and insight appear normal. Mood & affect appropriate.   Condition at discharge: fair  The results of significant diagnostics from this hospitalization (including imaging, microbiology, ancillary and laboratory) are listed below for reference.   Imaging Studies: US Abdomen Complete Result Date: 04/07/2023 CLINICAL DATA:  Abdominal pain, fever. EXAM: ABDOMEN ULTRASOUND COMPLETE COMPARISON:  Noncontrast CT 04/07/2023. Older CT examinations as well. FINDINGS: Gallbladder: Dilated gallbladder. No wall thickening or adjacent fluid. There are shadowing stones. Common bile duct: Diameter: 8 mm, within normal limits for the patient's age. In retrospect on the prior CT scan there may be a calcification along course of the distal common  duct which is not as well seen on this ultrasound. Liver: No focal lesion identified. Within normal limits in parenchymal echogenicity. Portal vein is patent on color Doppler imaging with normal direction of blood flow towards the liver. IVC: No abnormality visualized. Pancreas: Visualized portion unremarkable. Spleen: Size and appearance within normal limits. Right Kidney: Length: 9.4 cm. Echogenicity within normal limits. No mass or hydronephrosis visualized. Mild perinephric fluid. Left Kidney: Length: 10.0 cm. Echogenicity within normal limits. No mass or hydronephrosis visualized. Abdominal aorta: No aneurysm visualized. Other findings: None. IMPRESSION: Dilated gallbladder with gallstones. No wall thickening or adjacent fluid. Common duct at the upper limits normal for patient's age but on prior CT scan in retrospect there is a questionable calcification along the course of the common duct. Recommend follow-up MRCP to further delineate when able. Minimal perinephric fluid on the right. Electronically Signed   By: Karen Kays M.D.   On:  04/07/2023 10:07   CT ABDOMEN PELVIS WO CONTRAST Result Date: 04/07/2023 CLINICAL DATA:  78 year old female with nausea vomiting diarrhea and lower abdominal pain for 2 days. History of colon resection. EXAM: CT ABDOMEN AND PELVIS WITHOUT CONTRAST TECHNIQUE: Multidetector CT imaging of the abdomen and pelvis was performed following the standard protocol without IV contrast. RADIATION DOSE REDUCTION: This exam was performed according to the departmental dose-optimization program which includes automated exposure control, adjustment of the mA and/or kV according to patient size and/or use of iterative reconstruction technique. COMPARISON:  CT Abdomen and Pelvis 03/22/2021. FINDINGS: Lower chest: Borderline cardiomegaly. No pericardial or pleural effusion. Negative lung bases. Hepatobiliary: Cholelithiasis and more distended gallbladder (series 2, image 30) but no pericholecystic inflammation is evident. Negative noncontrast liver. Pancreas: Negative noncontrast appearance. Spleen: Negative. Adrenals/Urinary Tract: Normal adrenal glands. Chronic but more asymmetric pararenal space inflammatory stranding now, conspicuous on the right. The right renal pelvis and proximal ureter also appear to be inflamed. But no nephrolithiasis is identified. Contralateral left renal collecting system and ureter appear negative. The right ureter is difficult to delineate but does not appear dilated along its course. There are pelvic phleboliths, but no obvious ureteral calculus and the distal right ureter is decompressed at the UVJ on series 2, image 72. Urinary bladder appears unremarkable. Stomach/Bowel: Distal large bowel anastomosis in the pelvis with no adverse features. Upstream large bowel decompressed. Gas in the transverse colon. Right colon appears negative, cecum partially located in the anterior pelvis. Appendix not identified, could be diminutive or absent. Terminal ileum is decompressed. No dilated small bowel. Small fat  containing umbilical hernia. No ventral abdominal hernia. Decompressed stomach. No free air or free fluid identified. Vascular/Lymphatic: Severe Aortoiliac calcified atherosclerosis. Vascular patency is not evaluated in the absence of IV contrast. Bilateral proximal femoral artery vascular stents. Overlying chronic inguinal surgical clips. Normal caliber abdominal aorta.  No lymphadenopathy identified. Reproductive: Surgically absent uterus. Diminutive or absent ovaries. Other: No pelvis free fluid. Musculoskeletal: Previously augmented L4 compression fracture. Previously augmented bilateral sacral ala, medial left iliac. Chronic ORIF proximal left femur. Stable visualized osseous structures. IMPRESSION: 1. Noncontrast right kidney and renal collecting system appear inflamed. But no urinary calculus or obstructing etiology can be identified. Consider alternatively ascending urinary infection and/or Pyelonephritis (the latter requires CT Abdomen with IV contrast for imaging evaluation). 2. Cholelithiasis with greater gallbladder distension, but no strong CT evidence of acute cholecystitis. Right upper quadrant ultrasound would be complementary. 3. Distal large bowel anastomosis in the pelvis with no adverse features. No bowel obstruction or discrete bowel inflammation is  identified. Electronically Signed   By: Odessa Fleming M.D.   On: 04/07/2023 08:08   DG Chest Port 1 View Result Date: 04/07/2023 CLINICAL DATA:  Questionable sepsis. Nausea and vomiting for 2 days. EXAM: PORTABLE CHEST 1 VIEW COMPARISON:  PA chest 09/14/2017. FINDINGS: Interval new right chest port device with IJ approach catheter terminating in the distal SVC. The lungs are clear with mildly elevated right hemidiaphragm. No pleural effusion is seen. The mediastinum is normally outlined. The cardiac size is normal. There is calcification in the transverse aorta. Thoracic cage is intact.  There is thoracic spondylosis. IMPRESSION: 1. No evidence of acute  chest disease. 2. Aortic atherosclerosis. 3. Right chest port device. Electronically Signed   By: Almira Bar M.D.   On: 04/07/2023 07:35   Intravitreal Injection, Pharmacologic Agent - OD - Right Eye Result Date: 03/28/2023 Time Out 03/28/2023. 3:09 PM. Confirmed correct patient, procedure, site, and patient consented. Anesthesia Topical anesthesia was used. Anesthetic medications included Lidocaine 2%, Proparacaine 0.5%. Procedure Preparation included 5% betadine to ocular surface, eyelid speculum. A (30 g) needle was used. Injection: 8 mg aflibercept 8 MG/0.07ML   Route: Intravitreal, Site: Right Eye   NDC: [id number], Lot: [id number], Expiration date: 01/03/2024, Waste: 0 mL Post-op Post injection exam found visual acuity of at least counting fingers. The patient tolerated the procedure well. There were no complications. The patient received written and verbal post procedure care education. Post injection medications were not given.   OCT, Retina - OU - Both Eyes Result Date: 03/28/2023 Right Eye Quality was good. Central Foveal Thickness: 327. Progression has improved. Findings include no IRF, no SRF, abnormal foveal contour, epiretinal membrane (Interval improvement in IRF / cystic changes temporal fovea and macula ). Left Eye Quality was good. Central Foveal Thickness: 290. Progression has been stable. Findings include normal foveal contour, no IRF, no SRF. Notes *Images captured and stored on drive Diagnosis / Impression: OD: Interval improvement in IRF / cystic changes temporal fovea and macula OS: NFP; no IRF/SRF--stable Clinical management: See below Abbreviations: NFP - Normal foveal profile. CME - cystoid macular edema. PED - pigment epithelial detachment. IRF - intraretinal fluid. SRF - subretinal fluid. EZ - ellipsoid zone. ERM - epiretinal membrane. ORA - outer retinal atrophy. ORT - outer retinal tubulation. SRHM - subretinal hyper-reflective material   VAS [place] ABI WITH/WO TBI Result  Date: 03/23/2023  LOWER EXTREMITY DOPPLER STUDY Patient Name:  Tyrianna Lightle Mcevers  Date of Exam:   03/17/2023 Medical Rec #: [id number]            Accession #:    [id number] Date of Birth: 07/30/1945             Patient Gender: F Patient Age:   36 years Exam Location:  [facility] Procedure:      VAS [place] ABI WITH/WO TBI Referring Phys: Festus Barren --------------------------------------------------------------------------------  Indications: Rest pain, and peripheral artery disease.  Vascular Interventions: On 03/08/2017 Aortogram - PTA of the left SFA with                         angioplasty/balloon. Stent placement to the left SFA.                         PTA of the left CIA with angioplasty/balloon.  10/30/2017 PTA of left SFA and proximal popliteal                         artery. Left SFA and proximal popliteal artery stent.                          10/01/2018:Aortogram and Selective Left Lower Extremity                         Angiogram. Thrombolytic therapy TPA instilled in the                         Left SFA and Popliteal Artery. Mechanical Thrombectomy                         to the Left SFA and Popliteal Artery. PTA of the Left                         External Iliac Artery and CFA. PTA of the Left SFA and                         Proximal Popliteal Artery. Stent placement to the                         Popliteal Artery and Distal SFA. Stent placed to the                         Left SFA.                          10/08/2018: Aortogram and Selctive Right Lower Extremity                         Angiogram. Mechanical Thrombectomy of the Right SFA,                         Popliteal Artery and Posterior tibial Artery. PTA of the                         Right SFA and Proximal Popliteal Artery. Stent placement                         to the Distal SFAand Proximal Popliteal Artery. Stent                         placement to the Right SFA. PTA of the Right Posterior                          Tibial Artery. placement. Comparison Study: 09/02/2022 Performing Technologist: Debbe Bales RVS  Examination Guidelines: A complete evaluation includes at minimum, Doppler waveform signals and systolic blood pressure reading at the level of bilateral brachial, anterior tibial, and posterior tibial arteries, when vessel segments are accessible. Bilateral testing is considered an integral part of a complete examination. Photoelectric Plethysmograph (PPG) waveforms and toe systolic pressure readings are included as required and additional duplex testing as needed. Limited examinations for reoccurring indications may be performed as noted.  ABI Findings: +---------+------------------+-----+---------+--------+  Right    Rt Pressure (mmHg)IndexWaveform Comment  +---------+------------------+-----+---------+--------+ Brachial 161                                      +---------+------------------+-----+---------+--------+ ATA      141               0.85 triphasic         +---------+------------------+-----+---------+--------+ PTA      164               0.99 triphasic         +---------+------------------+-----+---------+--------+ Great Toe141               0.85 Normal            +---------+------------------+-----+---------+--------+ +---------+------------------+-----+---------+-------+ Left     Lt Pressure (mmHg)IndexWaveform Comment +---------+------------------+-----+---------+-------+ Brachial 166                                     +---------+------------------+-----+---------+-------+ ATA      147               0.89 biphasic         +---------+------------------+-----+---------+-------+ PTA      168               1.01 triphasic        +---------+------------------+-----+---------+-------+ Great Toe153               0.92 Normal           +---------+------------------+-----+---------+-------+ +-------+-----------+-----------+------------+------------+  ABI/TBIToday's ABIToday's TBIPrevious ABIPrevious TBI +-------+-----------+-----------+------------+------------+ Right  .99        .85        1.12        1.08         +-------+-----------+-----------+------------+------------+ Left   1.01       .92        1.06        .96          +-------+-----------+-----------+------------+------------+ Bilateral ABIs appear essentially unchanged compared to prior study on 09/02/2022. Left TBIs appear essentially unchanged compared to prior study on 09/02/2022. Right TBIs appear to be decreased compared to prior study on 09/02/2022.  Summary: Right: Resting right ankle-brachial index is within normal range. The right toe-brachial index is normal. Left: Resting left ankle-brachial index is within normal range. The left toe-brachial index is normal. *See table(s) above for measurements and observations.  Electronically signed by Festus Barren MD on 03/23/2023 at 3:40:30 PM.    Final     Microbiology: Results for orders placed or performed during the hospital encounter of 04/07/23  Blood Culture (routine x 2)     Status: None (Preliminary result)   Collection Time: 04/07/23  7:33 AM   Specimen: BLOOD  Result Value Ref Range Status   Specimen Description BLOOD BLOOD LEFT FOREARM  Final   Special Requests   Final    BOTTLES DRAWN AEROBIC AND ANAEROBIC Blood Culture results may not be optimal due to an inadequate volume of blood received in culture bottles   Culture  Setup Time   Final    GRAM NEGATIVE RODS AEROBIC BOTTLE ONLY CRITICAL VALUE NOTED.  VALUE IS CONSISTENT WITH PREVIOUSLY REPORTED AND CALLED VALUE. Performed at [facility], [address] [place], [place] [address]    Culture GRAM NEGATIVE RODS  Final  Report Status PENDING  Incomplete  Blood Culture (routine x 2)     Status: Abnormal   Collection Time: 04/07/23  7:33 AM   Specimen: BLOOD  Result Value Ref Range Status   Specimen Description   Final    BLOOD BLOOD RIGHT  HAND Performed at [facility], [address], [place], [place] [address]    Special Requests   Final    BOTTLES DRAWN AEROBIC AND ANAEROBIC Blood Culture adequate volume Performed at [facility], [address] [place], [place] [address]    Culture  Setup Time   Final    GRAM NEGATIVE RODS AEROBIC BOTTLE ONLY CRITICAL RESULT CALLED TO, READ BACK BY AND VERIFIED WITH: NATHAN BELUE AT 0305 ON 04/08/23 BY SS    Culture ESCHERICHIA COLI (A)  Final   Report Status 04/10/2023 FINAL  Final   Organism ID, Bacteria ESCHERICHIA COLI  Final   Organism ID, Bacteria ESCHERICHIA COLI  Final      Susceptibility   Escherichia coli - KIRBY BAUER*    CEFAZOLIN INTERMEDIATE Intermediate    Escherichia coli - MIC*    AMPICILLIN >=32 RESISTANT Resistant     CEFEPIME <=0.12 SENSITIVE Sensitive     CEFTAZIDIME <=1 SENSITIVE Sensitive     CEFTRIAXONE <=0.25 SENSITIVE Sensitive     CIPROFLOXACIN <=0.25 SENSITIVE Sensitive     GENTAMICIN <=1 SENSITIVE Sensitive     IMIPENEM <=0.25 SENSITIVE Sensitive     TRIMETH/SULFA <=20 SENSITIVE Sensitive     AMPICILLIN/SULBACTAM 16 INTERMEDIATE Intermediate     PIP/TAZO <=4 SENSITIVE Sensitive ug/mL    * ESCHERICHIA COLI    ESCHERICHIA COLI  Resp panel by RT-PCR (RSV, Flu A&B, Covid) Anterior Nasal Swab     Status: None   Collection Time: 04/07/23  7:33 AM   Specimen: Anterior Nasal Swab  Result Value Ref Range Status   SARS Coronavirus 2 by RT PCR NEGATIVE NEGATIVE Final    Comment: (NOTE) SARS-CoV-2 target nucleic acids are NOT DETECTED.  The SARS-CoV-2 RNA is generally detectable in upper respiratory specimens during the acute phase of infection. The lowest concentration of SARS-CoV-2 viral copies this assay can detect is 138 copies/mL. A negative result does not preclude SARS-Cov-2 infection and should not be used as the sole basis for treatment or other patient management decisions. A negative result may occur with  improper  specimen collection/handling, submission of specimen other than nasopharyngeal swab, presence of viral mutation(s) within the areas targeted by this assay, and inadequate number of viral copies(<138 copies/mL). A negative result must be combined with clinical observations, patient history, and epidemiological information. The expected result is Negative.  Fact Sheet for Patients:  BloggerCourse.com  Fact Sheet for Healthcare Providers:  SeriousBroker.it  This test is no t yet approved or cleared by the [place] FDA and  has been authorized for detection and/or diagnosis of SARS-CoV-2 by FDA under an Emergency Use Authorization (EUA). This EUA will remain  in effect (meaning this test can be used) for the duration of the COVID-19 declaration under Section 564(b)(1) of the Act, 21 U.S.C.section 360bbb-3(b)(1), unless the authorization is terminated  or revoked sooner.       Influenza A by PCR NEGATIVE NEGATIVE Final   Influenza B by PCR NEGATIVE NEGATIVE Final    Comment: (NOTE) The Xpert Xpress SARS-CoV-2/FLU/RSV plus assay is intended as an aid in the diagnosis of influenza from Nasopharyngeal swab specimens and should not be used as a sole basis for treatment. Nasal  washings and aspirates are unacceptable for Xpert Xpress SARS-CoV-2/FLU/RSV testing.  Fact Sheet for Patients: BloggerCourse.com  Fact Sheet for Healthcare Providers: SeriousBroker.it  This test is not yet approved or cleared by the [place] FDA and has been authorized for detection and/or diagnosis of SARS-CoV-2 by FDA under an Emergency Use Authorization (EUA). This EUA will remain in effect (meaning this test can be used) for the duration of the COVID-19 declaration under Section 564(b)(1) of the Act, 21 U.S.C. section 360bbb-3(b)(1), unless the authorization is terminated or revoked.     Resp  Syncytial Virus by PCR NEGATIVE NEGATIVE Final    Comment: (NOTE) Fact Sheet for Patients: BloggerCourse.com  Fact Sheet for Healthcare Providers: SeriousBroker.it  This test is not yet approved or cleared by the [place] FDA and has been authorized for detection and/or diagnosis of SARS-CoV-2 by FDA under an Emergency Use Authorization (EUA). This EUA will remain in effect (meaning this test can be used) for the duration of the COVID-19 declaration under Section 564(b)(1) of the Act, 21 U.S.C. section 360bbb-3(b)(1), unless the authorization is terminated or revoked.  Performed at [facility], [address] [place], [place] [address]   Blood Culture ID Panel (Reflexed)     Status: Abnormal   Collection Time: 04/07/23  7:33 AM  Result Value Ref Range Status   Enterococcus faecalis NOT DETECTED NOT DETECTED Final   Enterococcus Faecium NOT DETECTED NOT DETECTED Final   Listeria monocytogenes NOT DETECTED NOT DETECTED Final   Staphylococcus species NOT DETECTED NOT DETECTED Final   Staphylococcus aureus (BCID) NOT DETECTED NOT DETECTED Final   Staphylococcus epidermidis NOT DETECTED NOT DETECTED Final   Staphylococcus lugdunensis NOT DETECTED NOT DETECTED Final   Streptococcus species NOT DETECTED NOT DETECTED Final   Streptococcus agalactiae NOT DETECTED NOT DETECTED Final   Streptococcus pneumoniae NOT DETECTED NOT DETECTED Final   Streptococcus pyogenes NOT DETECTED NOT DETECTED Final   A.calcoaceticus-baumannii NOT DETECTED NOT DETECTED Final   Bacteroides fragilis NOT DETECTED NOT DETECTED Final   Enterobacterales DETECTED (A) NOT DETECTED Final    Comment: Enterobacterales represent a large order of gram negative bacteria, not a single organism. NATHAN BELUE AT 0305 ON 04/08/23 BY SS    Enterobacter cloacae complex NOT DETECTED NOT DETECTED Final   Escherichia coli DETECTED (A) NOT DETECTED Final    Comment:  CRITICAL RESULT CALLED TO, READ BACK BY AND VERIFIED WITH: NATHAN BELUE AT 0305 ON 04/08/23 BY SS    Klebsiella aerogenes NOT DETECTED NOT DETECTED Final   Klebsiella oxytoca NOT DETECTED NOT DETECTED Final   Klebsiella pneumoniae NOT DETECTED NOT DETECTED Final   Proteus species NOT DETECTED NOT DETECTED Final   Salmonella species NOT DETECTED NOT DETECTED Final   Serratia marcescens NOT DETECTED NOT DETECTED Final   Haemophilus influenzae NOT DETECTED NOT DETECTED Final   Neisseria meningitidis NOT DETECTED NOT DETECTED Final   Pseudomonas aeruginosa NOT DETECTED NOT DETECTED Final   Stenotrophomonas maltophilia NOT DETECTED NOT DETECTED Final   Candida albicans NOT DETECTED NOT DETECTED Final   Candida auris NOT DETECTED NOT DETECTED Final   Candida glabrata NOT DETECTED NOT DETECTED Final   Candida krusei NOT DETECTED NOT DETECTED Final   Candida parapsilosis NOT DETECTED NOT DETECTED Final   Candida tropicalis NOT DETECTED NOT DETECTED Final   Cryptococcus neoformans/gattii NOT DETECTED NOT DETECTED Final   CTX-M ESBL NOT DETECTED NOT DETECTED Final   Carbapenem resistance IMP NOT DETECTED NOT DETECTED Final   Carbapenem resistance KPC  NOT DETECTED NOT DETECTED Final   Carbapenem resistance NDM NOT DETECTED NOT DETECTED Final   Carbapenem resist OXA 48 LIKE NOT DETECTED NOT DETECTED Final   Carbapenem resistance VIM NOT DETECTED NOT DETECTED Final    Comment: Performed at [facility], [address] [place], [place] [address]  Urine culture     Status: Abnormal   Collection Time: 04/07/23  8:35 AM   Specimen: Urine, Clean Catch  Result Value Ref Range Status   Specimen Description   Final    URINE, CLEAN CATCH Performed at [facility], [address], [place], [place] [address]    Special Requests   Final    NONE Performed at [facility], [address] [place], [place] [address]    Culture 70,000 COLONIES/mL ESCHERICHIA COLI (A)  Final    Report Status 04/09/2023 FINAL  Final   Organism ID, Bacteria ESCHERICHIA COLI (A)  Final      Susceptibility   Escherichia coli - MIC*    AMPICILLIN >=32 RESISTANT Resistant     CEFAZOLIN <=4 SENSITIVE Sensitive     CEFEPIME <=0.12 SENSITIVE Sensitive     CEFTRIAXONE <=0.25 SENSITIVE Sensitive     CIPROFLOXACIN <=0.25 SENSITIVE Sensitive     GENTAMICIN <=1 SENSITIVE Sensitive     IMIPENEM <=0.25 SENSITIVE Sensitive     NITROFURANTOIN <=16 SENSITIVE Sensitive     TRIMETH/SULFA <=20 SENSITIVE Sensitive     AMPICILLIN/SULBACTAM 16 INTERMEDIATE Intermediate     PIP/TAZO <=4 SENSITIVE Sensitive ug/mL    * 70,000 COLONIES/mL ESCHERICHIA COLI  Gastrointestinal Panel by PCR , Stool     Status: None   Collection Time: 04/07/23  3:40 PM   Specimen: Stool  Result Value Ref Range Status   Campylobacter species NOT DETECTED NOT DETECTED Final   Plesimonas shigelloides NOT DETECTED NOT DETECTED Final   Salmonella species NOT DETECTED NOT DETECTED Final   Yersinia enterocolitica NOT DETECTED NOT DETECTED Final   Vibrio species NOT DETECTED NOT DETECTED Final   Vibrio cholerae NOT DETECTED NOT DETECTED Final   Enteroaggregative E coli (EAEC) NOT DETECTED NOT DETECTED Final   Enteropathogenic E coli (EPEC) NOT DETECTED NOT DETECTED Final   Enterotoxigenic E coli (ETEC) NOT DETECTED NOT DETECTED Final   Shiga like toxin producing E coli (STEC) NOT DETECTED NOT DETECTED Final   Shigella/Enteroinvasive E coli (EIEC) NOT DETECTED NOT DETECTED Final   Cryptosporidium NOT DETECTED NOT DETECTED Final   Cyclospora cayetanensis NOT DETECTED NOT DETECTED Final   Entamoeba histolytica NOT DETECTED NOT DETECTED Final   Giardia lamblia NOT DETECTED NOT DETECTED Final   Adenovirus F40/41 NOT DETECTED NOT DETECTED Final   Astrovirus NOT DETECTED NOT DETECTED Final   Norovirus GI/GII NOT DETECTED NOT DETECTED Final   Rotavirus A NOT DETECTED NOT DETECTED Final   Sapovirus (I, II, IV, and V) NOT DETECTED NOT  DETECTED Final    Comment: Performed at [facility], [address] [place], [place] [address]  C Difficile Quick Screen w PCR reflex     Status: None   Collection Time: 04/07/23  3:40 PM   Specimen: Stool  Result Value Ref Range Status   C Diff antigen NEGATIVE NEGATIVE Final   C Diff toxin NEGATIVE NEGATIVE Final   C Diff interpretation No C. difficile detected.  Final    Comment: Performed at [facility], [address] [place], [place] [address]    Labs: CBC: Recent Labs  Lab 04/07/23 [phone] 04/08/23 [phone]  04/09/23 0523 04/10/23 0451  WBC 8.0 7.3 5.1 5.3  NEUTROABS 6.3  --   --   --   HGB 9.2* 7.8* 8.9* 8.0*  HCT 27.8* 23.6* 27.5* 24.8*  MCV 89.1 88.7 90.2 91.9  PLT 241 240 242 210   Basic Metabolic Panel: Recent Labs  Lab 04/07/23 0652 04/08/23 0459 04/09/23 0523 04/10/23 0451  NA 133* 134* 138 138  K 3.5 3.4* 4.0 4.1  CL 99 104 108 111  CO2 22 22 21* 19*  GLUCOSE 187* 187* 153* 174*  BUN 38* 40* 37* 25*  CREATININE 2.26* 2.36* 1.85* 1.38*  CALCIUM 7.7* 7.3* 7.4* 7.5*   Liver Function Tests: Recent Labs  Lab 04/07/23 0652  AST 17  ALT 16  ALKPHOS 46  BILITOT 1.1  PROT 6.5  ALBUMIN 3.2*   CBG: Recent Labs  Lab 04/10/23 0841 04/10/23 1211 04/10/23 1619 04/10/23 2111 04/11/23 0749  GLUCAP 154* 221* 145* 145* 154*    Discharge time spent: greater than 30 minutes.  Signed: Delfino Lovett, MD [facility] 04/11/2023

## 2023-04-11 NOTE — Progress Notes (Addendum)
[facility] Liaison Note:   (new referral for outpatient palliative services) Notified by [facility], Bevelyn Ngo, RN, of patient/family request for [facility] services at home after discharge. Referral submitted today.    Please call with any hospice or outpatient palliative care related questions. Thank you for the opportunity to participate in this patient's care.  Redge Gainer, [facility] Liaison [phone]

## 2023-04-11 NOTE — Progress Notes (Signed)
  Pt Temp overnight improving. Temp lowest 99.1 to hight 99.5. Pt reported feeling better with no s/s of distress.   Pt BP 179/71 HR 80 @ 0421. Pt has no c/o chest pain, Headache, or SOB. Prn hydralazine given per order. BP improved to 121/48 HR 75. Pt resting in bed with no c/o dizziness, headache, or SOB. Will cont to monitor BP.

## 2023-04-13 LAB — CULTURE, BLOOD (ROUTINE X 2): Culture  Setup Time: NO GROWTH

## 2023-04-17 ENCOUNTER — Other Ambulatory Visit: Payer: Self-pay | Admitting: *Deleted

## 2023-04-17 DIAGNOSIS — D649 Anemia, unspecified: Secondary | ICD-10-CM

## 2023-04-17 DIAGNOSIS — N183 Chronic kidney disease, stage 3 unspecified: Secondary | ICD-10-CM

## 2023-04-18 ENCOUNTER — Encounter: Payer: Self-pay | Admitting: Internal Medicine

## 2023-04-18 ENCOUNTER — Ambulatory Visit: Payer: PRIVATE HEALTH INSURANCE

## 2023-04-18 ENCOUNTER — Other Ambulatory Visit: Payer: PRIVATE HEALTH INSURANCE

## 2023-04-18 ENCOUNTER — Inpatient Hospital Stay: Payer: PRIVATE HEALTH INSURANCE

## 2023-04-18 ENCOUNTER — Inpatient Hospital Stay (HOSPITAL_BASED_OUTPATIENT_CLINIC_OR_DEPARTMENT_OTHER): Payer: PRIVATE HEALTH INSURANCE | Admitting: Internal Medicine

## 2023-04-18 ENCOUNTER — Inpatient Hospital Stay: Payer: PRIVATE HEALTH INSURANCE | Attending: Internal Medicine

## 2023-04-18 ENCOUNTER — Ambulatory Visit: Payer: PRIVATE HEALTH INSURANCE | Admitting: Internal Medicine

## 2023-04-18 VITALS — BP 146/56 | HR 73 | Temp 98.7°F | Resp 18 | Ht 65.0 in | Wt 173.4 lb

## 2023-04-18 VITALS — BP 153/55 | HR 63 | Resp 16

## 2023-04-18 DIAGNOSIS — N183 Chronic kidney disease, stage 3 unspecified: Secondary | ICD-10-CM | POA: Diagnosis not present

## 2023-04-18 DIAGNOSIS — D509 Iron deficiency anemia, unspecified: Secondary | ICD-10-CM | POA: Insufficient documentation

## 2023-04-18 DIAGNOSIS — Z79899 Other long term (current) drug therapy: Secondary | ICD-10-CM | POA: Diagnosis not present

## 2023-04-18 DIAGNOSIS — E1122 Type 2 diabetes mellitus with diabetic chronic kidney disease: Secondary | ICD-10-CM | POA: Diagnosis not present

## 2023-04-18 DIAGNOSIS — D631 Anemia in chronic kidney disease: Secondary | ICD-10-CM

## 2023-04-18 DIAGNOSIS — I129 Hypertensive chronic kidney disease with stage 1 through stage 4 chronic kidney disease, or unspecified chronic kidney disease: Secondary | ICD-10-CM | POA: Insufficient documentation

## 2023-04-18 DIAGNOSIS — D649 Anemia, unspecified: Secondary | ICD-10-CM

## 2023-04-18 LAB — CBC WITH DIFFERENTIAL (CANCER CENTER ONLY)
Abs Immature Granulocytes: 0.03 10*3/uL (ref 0.00–0.07)
Basophils Absolute: 0.1 10*3/uL (ref 0.0–0.1)
Basophils Relative: 1 %
Eosinophils Absolute: 0.1 10*3/uL (ref 0.0–0.5)
Eosinophils Relative: 1 %
HCT: 28.4 % — ABNORMAL LOW (ref 36.0–46.0)
Hemoglobin: 8.8 g/dL — ABNORMAL LOW (ref 12.0–15.0)
Immature Granulocytes: 1 %
Lymphocytes Relative: 19 %
Lymphs Abs: 1.2 10*3/uL (ref 0.7–4.0)
MCH: 28.7 pg (ref 26.0–34.0)
MCHC: 31 g/dL (ref 30.0–36.0)
MCV: 92.5 fL (ref 80.0–100.0)
Monocytes Absolute: 0.5 10*3/uL (ref 0.1–1.0)
Monocytes Relative: 8 %
Neutro Abs: 4.6 10*3/uL (ref 1.7–7.7)
Neutrophils Relative %: 70 %
Platelet Count: 516 10*3/uL — ABNORMAL HIGH (ref 150–400)
RBC: 3.07 MIL/uL — ABNORMAL LOW (ref 3.87–5.11)
RDW: 14.7 % (ref 11.5–15.5)
WBC Count: 6.5 10*3/uL (ref 4.0–10.5)
nRBC: 0 % (ref 0.0–0.2)

## 2023-04-18 LAB — IRON AND TIBC
Iron: 117 ug/dL (ref 28–170)
Saturation Ratios: 30 % (ref 10.4–31.8)
TIBC: 385 ug/dL (ref 250–450)
UIBC: 268 ug/dL

## 2023-04-18 LAB — BASIC METABOLIC PANEL WITH GFR
Anion gap: 9 (ref 5–15)
BUN: 24 mg/dL — ABNORMAL HIGH (ref 8–23)
CO2: 24 mmol/L (ref 22–32)
Calcium: 9.6 mg/dL (ref 8.9–10.3)
Chloride: 103 mmol/L (ref 98–111)
Creatinine, Ser: 1.44 mg/dL — ABNORMAL HIGH (ref 0.44–1.00)
GFR, Estimated: 37 mL/min — ABNORMAL LOW (ref >60)
Glucose, Bld: 198 mg/dL — ABNORMAL HIGH (ref 70–99)
Potassium: 4.5 mmol/L (ref 3.5–5.1)
Sodium: 136 mmol/L (ref 135–145)

## 2023-04-18 LAB — SAMPLE TO BLOOD BANK

## 2023-04-18 LAB — FERRITIN: Ferritin: 44 ng/mL (ref 11–307)

## 2023-04-18 MED ORDER — IRON SUCROSE 20 MG/ML IV SOLN
200.0000 mg | Freq: Once | INTRAVENOUS | Status: AC
  Administered 2023-04-18: 200 mg via INTRAVENOUS
  Filled 2023-04-18: qty 10

## 2023-04-18 NOTE — Patient Instructions (Signed)

## 2023-04-18 NOTE — Progress Notes (Signed)
 [facility] CONSULT NOTE  Patient Care Team: Danella Penton, MD as PCP - General (Internal Medicine) Elnita Maxwell, MD as Referring Physician (Gastroenterology) Earna Coder, MD as Consulting Physician (Hematology and Oncology)  CHIEF COMPLAINTS/PURPOSE OF CONSULTATION: Anemia  HEMATOLOGY HISTORY  # ANEMIA- Jan 2021- 8.8/ferritin 11 [PCP]; N-WBC/platelets? IDA vs other- EGD-2015/colonoscopy-? 2015; 2020- [Dr.Skulskie] ; capsule-2016- ? 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   HISTORY OF PRESENTING ILLNESS: Ambulating walking with a cane.  With husband.    Cheryl Leonard 78 y.o.  female anemia iron deficiency-question CKD-III  here for follow-up.   Patient was recently admitted to hospital for 4/25 [facility] for E coli. Finished antibiotic 2 days ago.  C/o of ongoing Fatigue. No visible blood in stool. Dyspnea with exertion. No falls. Uses cane.   Review of Systems  Constitutional:  Positive for malaise/fatigue. Negative for chills, diaphoresis and fever.  HENT:  Negative for nosebleeds and sore throat.   Eyes:  Negative for double vision.  Respiratory:  Positive for shortness of breath. Negative for cough, hemoptysis, sputum production and wheezing.   Cardiovascular:  Negative for chest pain, palpitations, orthopnea and leg swelling.  Gastrointestinal:  Negative for abdominal pain, blood in stool, constipation, diarrhea, heartburn, melena, nausea and vomiting.  Genitourinary:  Negative for dysuria, frequency and urgency.  Musculoskeletal:  Positive for joint pain.  Skin: Negative.  Negative for itching and rash.  Neurological:  Negative for dizziness, tingling, focal weakness, weakness and headaches.  Endo/Heme/Allergies:  Does not bruise/bleed easily.   Psychiatric/Behavioral:  Negative for depression. The patient is not nervous/anxious and does not have insomnia.     MEDICAL HISTORY:  Past Medical History:  Diagnosis Date   Anemia    Anxiety    Arthritis    Gout   Cataracts, both eyes    Diabetic retinopathy (HCC)    NPDR OU   Diverticulitis    GERD (gastroesophageal reflux disease)    Gout    Headache    h/o migraines   History of fracture of patella    right knee   History of positive PPD    Patient always shows positive   Hyperlipidemia    Hypertension    Hypertensive retinopathy    OU   Hypothyroidism    Lichen sclerosus 12/30/2013   of vulva   Metatarsal fracture    Nausea and vomiting 04/11/2023   Neuropathy    Osteopenia    Peripheral vascular disease (HCC)    Polyneuropathy    numbness and tingling in feet and toes   Renal insufficiency    Stage 3   Sleep apnea    does not use cpap-lost weight    Type 2 diabetes mellitus, uncontrolled     SURGICAL HISTORY: Past Surgical History:  Procedure Laterality Date   ABDOMINAL HYSTERECTOMY     AMPUTATION TOE Right 05/08/2020   Procedure: AMPUTATION TOE-Right 4th Toe;  Surgeon: Rosetta Posner, DPM;  Location: [facility];  Service: Podiatry;  Laterality: Right;   AMPUTATION TOE Left 04/22/2021   Procedure: AMPUTATION TOE - 4TH METARSOPHANGEAL JOINT;  Surgeon: Rosetta Posner, DPM;  Location: [facility];  Service: Podiatry;  Laterality: Left;   APPENDECTOMY     BREAST REDUCTION SURGERY  2001   CATARACT EXTRACTION     CESAREAN SECTION  1976   COLONOSCOPY  03/05/2013   Nml - due for repeat 03/06/2018   COLONOSCOPY WITH PROPOFOL N/A 03/18/2019   Procedure: COLONOSCOPY WITH PROPOFOL;  Surgeon: Toledo, Alphonsus Jeans, MD;  Location: [facility];  Service: Gastroenterology;  Laterality: N/A;   DIAGNOSTIC LAPAROSCOPY     DILATION AND CURETTAGE OF UTERUS  1989   ENDARTERECTOMY FEMORAL Bilateral 03/09/2018   Procedure: ENDARTERECTOMY FEMORAL;  Surgeon: Celso College, MD;  Location:  [facility];  Service: Vascular;  Laterality: Bilateral;   ENDARTERECTOMY POPLITEAL Left 03/09/2018   Procedure: ENDARTERECTOMY POPLITEAL AND SFA;  Surgeon: Celso College, MD;  Location: [facility];  Service: Vascular;  Laterality: Left;   ESOPHAGOGASTRODUODENOSCOPY  03/05/2013   ESOPHAGOGASTRODUODENOSCOPY (EGD) WITH PROPOFOL N/A 03/18/2019   Procedure: ESOPHAGOGASTRODUODENOSCOPY (EGD) WITH PROPOFOL;  Surgeon: Toledo, Alphonsus Jeans, MD;  Location: [facility];  Service: Gastroenterology;  Laterality: N/A;   EYE SURGERY     Eyelid Surgery  2012   INTRAMEDULLARY (IM) NAIL INTERTROCHANTERIC Left 10/30/2015   Procedure: INTRAMEDULLARY (IM) NAIL INTERTROCHANTRIC ;  Surgeon: Molli Angelucci, MD;  Location: [facility];  Service: Orthopedics;  Laterality: Left;   KYPHOPLASTY N/A 10/25/2018   Procedure: L4 KYPHOPLASTY;  Surgeon: Molli Angelucci, MD;  Location: [facility];  Service: Orthopedics;  Laterality: N/A;   LAPAROSCOPIC HYSTERECTOMY  2000   total   LOWER EXTREMITY ANGIOGRAPHY Left 03/08/2017   Procedure: LOWER EXTREMITY ANGIOGRAPHY;  Surgeon: Celso College, MD;  Location: [facility];  Service: Cardiovascular;  Laterality: Left;   LOWER EXTREMITY ANGIOGRAPHY Left 10/30/2017   Procedure: LOWER EXTREMITY ANGIOGRAPHY;  Surgeon: Celso College, MD;  Location: [facility];  Service: Cardiovascular;  Laterality: Left;   LOWER EXTREMITY ANGIOGRAPHY Right 03/08/2018   Procedure: LOWER EXTREMITY ANGIOGRAPHY;  Surgeon: Celso College, MD;  Location: [facility];  Service: Cardiovascular;  Laterality: Right;   LOWER EXTREMITY ANGIOGRAPHY Left 10/01/2018   Procedure: LOWER EXTREMITY ANGIOGRAPHY;  Surgeon: Celso College, MD;  Location: [facility];  Service: Cardiovascular;  Laterality: Left;   LOWER EXTREMITY ANGIOGRAPHY Right 10/08/2018   Procedure: LOWER EXTREMITY ANGIOGRAPHY;  Surgeon: Celso College, MD;  Location: [facility];  Service: Cardiovascular;  Laterality: Right;   LOWER EXTREMITY  ANGIOGRAPHY Right 05/07/2020   Procedure: Lower Extremity Angiography;  Surgeon: Celso College, MD;  Location: [facility];  Service: Cardiovascular;  Laterality: Right;   LYSIS OF ADHESION  01/25/2021   Procedure: LYSIS OF ADHESION;  Surgeon: Flynn Hylan, MD;  Location: [facility];  Service: General;;   PERIPHERAL VASCULAR INTERVENTION  03/08/2018   Procedure: PERIPHERAL VASCULAR INTERVENTION;  Surgeon: Celso College, MD;  Location: [facility];  Service: Cardiovascular;;   PORTA CATH INSERTION N/A 02/17/2020   Procedure: PORTA CATH INSERTION;  Surgeon: Celso College, MD;  Location: [facility];  Service: Cardiovascular;  Laterality: N/A;   REDUCTION MAMMAPLASTY  1997   SACROPLASTY N/A 10/25/2018   Procedure: S1 SACROPLASTY;  Surgeon: Molli Angelucci, MD;  Location: [facility];  Service: Orthopedics;  Laterality: N/A;    SOCIAL HISTORY: Social History   Socioeconomic History   Marital status: Married    Spouse name: [name]   Number of children: 3   Years of education: Not on file   Highest education level: Not on file  Occupational History   Occupation: Barrister's clerk  Tobacco Use   Smoking status: Former    Current packs/day: 0.00    Average  packs/day: 1 pack/day for 20.0 years (20.0 ttl pk-yrs)    Types: Cigarettes    Start date: 03/07/1976    Quit date: 03/07/1996    Years since quitting: 27.1    Passive exposure: Past   Smokeless tobacco: Never   Tobacco comments:    started smoking at age 36 but stopped smoking in 2000  Vaping Use   Vaping status: Never Used  Substance and Sexual Activity   Alcohol use: No    Alcohol/week: 0.0 standard drinks of alcohol   Drug use: No   Sexual activity: Yes    Partners: Male    Birth control/protection: Surgical  Other Topics Concern   Not on file  Social History Narrative   Lives in [place]; with husband; quit > 20 years; no alcohol; used to work at [employer]  at [employer].    Social Drivers of Manufacturing engineer Strain: Low Risk  (02/03/2023)   Received from [facility]   Overall Financial Resource Strain (CARDIA)    Difficulty of Paying Living Expenses: Not hard at all  Food Insecurity: No Food Insecurity (04/07/2023)   Hunger Vital Sign    Worried About Running Out of Food in the Last Year: Never true    Ran Out of Food in the Last Year: Never true  Transportation Needs: No Transportation Needs (04/07/2023)   PRAPARE - Administrator, Civil Service (Medical): No    Lack of Transportation (Non-Medical): No  Physical Activity: Unknown (10/01/2018)   Exercise Vital Sign    Days of Exercise per Week: 2 days    Minutes of Exercise per Session: Not on file  Stress: Stress Concern Present (10/01/2018)   [employer] of Occupational Health - Occupational Stress Questionnaire    Feeling of Stress : To some extent  Social Connections: Moderately Integrated (04/07/2023)   Social Connection and Isolation Panel [NHANES]    Frequency of Communication with Friends and Family: More than three times a week    Frequency of Social Gatherings with Friends and Family: More than three times a week    Attends Religious Services: More than 4 times per year    Active Member of [employer] or Organizations: No    Attends Banker Meetings: Never    Marital Status: Married  Catering manager Violence: Not At Risk (04/07/2023)   Humiliation, Afraid, Rape, and Kick questionnaire    Fear of Current or Ex-Partner: No    Emotionally Abused: No    Physically Abused: No    Sexually Abused: No    FAMILY HISTORY: Family History  Problem Relation Age of Onset   Coronary artery disease Father    Heart attack Father    Coronary artery disease Mother    Heart attack Mother    Ovarian cancer Sister 10       sister had hormonal therapy for IVF txs-which increased risk factor for ovarian cancer   Breast cancer Neg Hx     ALLERGIES:  is allergic to ace  inhibitors.  MEDICATIONS:  Current Outpatient Medications  Medication Sig Dispense Refill   ALPRAZolam (XANAX) 0.25 MG tablet Take 0.25 mg by mouth daily as needed for anxiety.     amLODipine (NORVASC) 5 MG tablet Take 5 mg by mouth 2 (two) times daily.     aspirin EC 81 MG tablet Take 1 tablet (81 mg total) by mouth daily. 30 tablet 11   cholecalciferol (VITAMIN D) 1000 units tablet Take 1,000  Units by mouth 2 (two) times daily.     dapagliflozin propanediol (FARXIGA) 5 MG TABS tablet Take 5 mg by mouth daily.     denosumab (PROLIA) 60 MG/ML SOLN injection Inject 60 mg into the skin every 6 (six) months.      escitalopram (LEXAPRO) 10 MG tablet Take 10 mg by mouth daily.     estradiol (ESTRACE) 0.1 MG/GM vaginal cream Place 1 Applicatorful vaginally daily as needed (vaginal irritation).     famotidine (PEPCID) 40 MG tablet Take 40 mg by mouth daily.     furosemide (LASIX) 20 MG tablet Take 20 mg by mouth every Wednesday.     gabapentin (NEURONTIN) 300 MG capsule Take 300 mg by mouth at bedtime as needed (pain).     levothyroxine (SYNTHROID, LEVOTHROID) 100 MCG tablet Take 100 mcg by mouth daily before breakfast.   3   lidocaine-prilocaine (EMLA) cream Apply 1 application topically as needed (apply prior to port a cath access). 30 g 3   Magnesium 500 MG TABS Take 500 mg by mouth every morning.      mirtazapine (REMERON) 15 MG tablet Take 15 mg by mouth as needed.     Multiple Vitamin (MULTIVITAMIN WITH MINERALS) TABS tablet Take 1 tablet by mouth daily. Centrum Silver     nystatin cream (MYCOSTATIN) Apply 1 application topically daily as needed (Yeast infection).     olmesartan (BENICAR) 20 MG tablet Take 20 mg by mouth daily.     pantoprazole (PROTONIX) 40 MG tablet Take 40 mg by mouth daily.     rivaroxaban (XARELTO) 20 MG TABS tablet Take 1 tablet (20 mg total) by mouth daily with supper. 90 tablet 3   rosuvastatin (CRESTOR) 20 MG tablet Take 20 mg by mouth every morning.     sodium  bicarbonate 650 MG tablet Take 650 mg by mouth 2 (two) times daily.     TRESIBA FLEXTOUCH 200 UNIT/ML SOPN Inject 25-30 Units as directed at bedtime. Titrate according to fasting blood glucose not to exceed 50 units a day  5   vitamin B-12 (CYANOCOBALAMIN) 1000 MCG tablet Take 1,000 mcg by mouth daily.     vitamin C (ASCORBIC ACID) 250 MG tablet Take 250 mg by mouth daily.     vitamin E 400 UNIT capsule Take 400 Units by mouth daily.     zolpidem (AMBIEN) 10 MG tablet Take 10 mg by mouth at bedtime.     No current facility-administered medications for this visit.      PHYSICAL EXAMINATION:   Vitals:   04/18/23 1341 04/18/23 1417  BP: (!) 152/60 (!) 146/56  Pulse: 73   Resp: 18   Temp: 98.7 F (37.1 C)   SpO2: 95%     Filed Weights   04/18/23 1341  Weight: 173 lb 6.4 oz (78.7 kg)     Physical Exam Constitutional:      Comments: Alone.  Ambulating independently.  HENT:     Head: Normocephalic and atraumatic.     Mouth/Throat:     Pharynx: No oropharyngeal exudate.  Eyes:     Pupils: Pupils are equal, round, and reactive to light.  Cardiovascular:     Rate and Rhythm: Normal rate and regular rhythm.  Pulmonary:     Effort: Pulmonary effort is normal. No respiratory distress.     Breath sounds: Normal breath sounds. No wheezing.  Abdominal:     General: Bowel sounds are normal. There is no distension.     Palpations: Abdomen  is soft. There is no mass.     Tenderness: There is no abdominal tenderness. There is no guarding or rebound.  Musculoskeletal:        General: No tenderness. Normal range of motion.     Cervical back: Normal range of motion and neck supple.  Skin:    General: Skin is warm.  Neurological:     Mental Status: She is alert and oriented to person, place, and time.  Psychiatric:        Mood and Affect: Affect normal.     LABORATORY DATA:  I have reviewed the data as listed Lab Results  Component Value Date   WBC 6.5 04/18/2023   HGB 8.8  (L) 04/18/2023   HCT 28.4 (L) 04/18/2023   MCV 92.5 04/18/2023   PLT 516 (H) 04/18/2023   Recent Labs    04/07/23 0652 04/08/23 0459 04/09/23 0523 04/10/23 0451 04/18/23 1334  NA 133*   < > 138 138 136  K 3.5   < > 4.0 4.1 4.5  CL 99   < > 108 111 103  CO2 22   < > 21* 19* 24  GLUCOSE 187*   < > 153* 174* 198*  BUN 38*   < > 37* 25* 24*  CREATININE 2.26*   < > 1.85* 1.38* 1.44*  CALCIUM 7.7*   < > 7.4* 7.5* 9.6  GFRNONAA 22*   < > 28* 39* 37*  PROT 6.5  --   --   --   --   ALBUMIN 3.2*  --   --   --   --   AST 17  --   --   --   --   ALT 16  --   --   --   --   ALKPHOS 46  --   --   --   --   BILITOT 1.1  --   --   --   --    < > = values in this interval not displayed.     US Abdomen Complete Result Date: 04/07/2023 CLINICAL DATA:  Abdominal pain, fever. EXAM: ABDOMEN ULTRASOUND COMPLETE COMPARISON:  Noncontrast CT 04/07/2023. Older CT examinations as well. FINDINGS: Gallbladder: Dilated gallbladder. No wall thickening or adjacent fluid. There are shadowing stones. Common bile duct: Diameter: 8 mm, within normal limits for the patient's age. In retrospect on the prior CT scan there may be a calcification along course of the distal common duct which is not as well seen on this ultrasound. Liver: No focal lesion identified. Within normal limits in parenchymal echogenicity. Portal vein is patent on color Doppler imaging with normal direction of blood flow towards the liver. IVC: No abnormality visualized. Pancreas: Visualized portion unremarkable. Spleen: Size and appearance within normal limits. Right Kidney: Length: 9.4 cm. Echogenicity within normal limits. No mass or hydronephrosis visualized. Mild perinephric fluid. Left Kidney: Length: 10.0 cm. Echogenicity within normal limits. No mass or hydronephrosis visualized. Abdominal aorta: No aneurysm visualized. Other findings: None. IMPRESSION: Dilated gallbladder with gallstones. No wall thickening or adjacent fluid. Common duct at the  upper limits normal for patient's age but on prior CT scan in retrospect there is a questionable calcification along the course of the common duct. Recommend follow-up MRCP to further delineate when able. Minimal perinephric fluid on the right. Electronically Signed   By: Karen Kays M.D.   On: 04/07/2023 10:07   CT ABDOMEN PELVIS WO CONTRAST Result Date: 04/07/2023 CLINICAL DATA:  78 year old female  with nausea vomiting diarrhea and lower abdominal pain for 2 days. History of colon resection. EXAM: CT ABDOMEN AND PELVIS WITHOUT CONTRAST TECHNIQUE: Multidetector CT imaging of the abdomen and pelvis was performed following the standard protocol without IV contrast. RADIATION DOSE REDUCTION: This exam was performed according to the departmental dose-optimization program which includes automated exposure control, adjustment of the mA and/or kV according to patient size and/or use of iterative reconstruction technique. COMPARISON:  CT Abdomen and Pelvis 03/22/2021. FINDINGS: Lower chest: Borderline cardiomegaly. No pericardial or pleural effusion. Negative lung bases. Hepatobiliary: Cholelithiasis and more distended gallbladder (series 2, image 30) but no pericholecystic inflammation is evident. Negative noncontrast liver. Pancreas: Negative noncontrast appearance. Spleen: Negative. Adrenals/Urinary Tract: Normal adrenal glands. Chronic but more asymmetric pararenal space inflammatory stranding now, conspicuous on the right. The right renal pelvis and proximal ureter also appear to be inflamed. But no nephrolithiasis is identified. Contralateral left renal collecting system and ureter appear negative. The right ureter is difficult to delineate but does not appear dilated along its course. There are pelvic phleboliths, but no obvious ureteral calculus and the distal right ureter is decompressed at the UVJ on series 2, image 72. Urinary bladder appears unremarkable. Stomach/Bowel: Distal large bowel anastomosis in the  pelvis with no adverse features. Upstream large bowel decompressed. Gas in the transverse colon. Right colon appears negative, cecum partially located in the anterior pelvis. Appendix not identified, could be diminutive or absent. Terminal ileum is decompressed. No dilated small bowel. Small fat containing umbilical hernia. No ventral abdominal hernia. Decompressed stomach. No free air or free fluid identified. Vascular/Lymphatic: Severe Aortoiliac calcified atherosclerosis. Vascular patency is not evaluated in the absence of IV contrast. Bilateral proximal femoral artery vascular stents. Overlying chronic inguinal surgical clips. Normal caliber abdominal aorta.  No lymphadenopathy identified. Reproductive: Surgically absent uterus. Diminutive or absent ovaries. Other: No pelvis free fluid. Musculoskeletal: Previously augmented L4 compression fracture. Previously augmented bilateral sacral ala, medial left iliac. Chronic ORIF proximal left femur. Stable visualized osseous structures. IMPRESSION: 1. Noncontrast right kidney and renal collecting system appear inflamed. But no urinary calculus or obstructing etiology can be identified. Consider alternatively ascending urinary infection and/or Pyelonephritis (the latter requires CT Abdomen with IV contrast for imaging evaluation). 2. Cholelithiasis with greater gallbladder distension, but no strong CT evidence of acute cholecystitis. Right upper quadrant ultrasound would be complementary. 3. Distal large bowel anastomosis in the pelvis with no adverse features. No bowel obstruction or discrete bowel inflammation is identified. Electronically Signed   By: Marlise Simpers M.D.   On: 04/07/2023 08:08   DG Chest Port 1 View Result Date: 04/07/2023 CLINICAL DATA:  Questionable sepsis. Nausea and vomiting for 2 days. EXAM: PORTABLE CHEST 1 VIEW COMPARISON:  PA chest 09/14/2017. FINDINGS: Interval new right chest port device with IJ approach catheter terminating in the distal SVC.  The lungs are clear with mildly elevated right hemidiaphragm. No pleural effusion is seen. The mediastinum is normally outlined. The cardiac size is normal. There is calcification in the transverse aorta. Thoracic cage is intact.  There is thoracic spondylosis. IMPRESSION: 1. No evidence of acute chest disease. 2. Aortic atherosclerosis. 3. Right chest port device. Electronically Signed   By: Denman Fischer M.D.   On: 04/07/2023 07:35   Intravitreal Injection, Pharmacologic Agent - OD - Right Eye Result Date: 03/28/2023 Time Out 03/28/2023. 3:09 PM. Confirmed correct patient, procedure, site, and patient consented. Anesthesia Topical anesthesia was used. Anesthetic medications included Lidocaine 2%, Proparacaine 0.5%. Procedure Preparation included 5%  betadine to ocular surface, eyelid speculum. A (30 g) needle was used. Injection: 8 mg aflibercept 8 MG/0.07ML   Route: Intravitreal, Site: Right Eye   NDC: [id number], Lot: [id number], Expiration date: 01/03/2024, Waste: 0 mL Post-op Post injection exam found visual acuity of at least counting fingers. The patient tolerated the procedure well. There were no complications. The patient received written and verbal post procedure care education. Post injection medications were not given.   OCT, Retina - OU - Both Eyes Result Date: 03/28/2023 Right Eye Quality was good. Central Foveal Thickness: 327. Progression has improved. Findings include no IRF, no SRF, abnormal foveal contour, epiretinal membrane (Interval improvement in IRF / cystic changes temporal fovea and macula ). Left Eye Quality was good. Central Foveal Thickness: 290. Progression has been stable. Findings include normal foveal contour, no IRF, no SRF. Notes *Images captured and stored on drive Diagnosis / Impression: OD: Interval improvement in IRF / cystic changes temporal fovea and macula OS: NFP; no IRF/SRF--stable Clinical management: See below Abbreviations: NFP - Normal foveal profile. CME -  cystoid macular edema. PED - pigment epithelial detachment. IRF - intraretinal fluid. SRF - subretinal fluid. EZ - ellipsoid zone. ERM - epiretinal membrane. ORA - outer retinal atrophy. ORT - outer retinal tubulation. 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  If Retacrit/iron does not improve consider bone marrow biopsy. On gentle iron [iron biglycinate; 28 mg ]1 pill a day.    #  hemoglobin 8.3 [recent sepsis]  HOLD retacrit; Proceed with venofer.   # Etiology-CKD-III--IV; overall worsened [Dr.Kolluru];- stable.   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  #Poor IV access/Mediport placement- stable.   Schedule retacrit/venofer on separate days  # DISPOSITION:  # Venofer today; NO retacrit.   # in 2 months- port/ labs- H&H- possible retacrit.   # follow up 4 months  MD; port; labs- cbc/bmp; iron studies;ferritin; possible retacrit OR venofer- Dr.B     All questions were answered. The patient knows to call the clinic with any problems, questions or concerns.    Gwyn Leos, MD 04/18/2023 2:25 PM

## 2023-04-18 NOTE — Progress Notes (Signed)
 04/07/23 [facility] for E coli. Finished antibiotic 2 days ago.  Pt states she is having some anxiety, taking xanax prn, this does help. Needs refill, also refill ambien pended.  Since being in the hospital, the hips down, she is having numbness.

## 2023-04-18 NOTE — Progress Notes (Signed)
 [facility] - Clinic Note  05/02/2023     CHIEF COMPLAINT Patient presents for Retina Follow Up   HISTORY OF PRESENT ILLNESS: Cheryl Leonard is a 78 y.o. female who presents to the clinic today for:    HPI     Retina Follow Up   Patient presents with  CRVO/BRVO.  In both eyes.  This started 5 weeks ago.  Duration of 5 weeks.  Since onset it is stable.  I, the attending physician,  performed the HPI with the patient and updated documentation appropriately.        Comments   5 week retina follow up BRVO OD and IVE HD OD pt is reporting no vision changes she denies any flashes or floaters       Last edited by Ronelle Coffee, MD on 05/02/2023  4:50 PM.    Pt states she was in the hospital with E. coli in her blood system since she was here last, it started out as a UTI  Referring physician: Sari Cunning, MD [address] [facility] [place],  [place] [address]   HISTORICAL INFORMATION:   Selected notes from the MEDICAL RECORD NUMBER Referred by Dr. Wayna Hails for concern of DME OD Lab Results  Component Value Date   HGBA1C 7.3 (H) 04/07/2023     CURRENT MEDICATIONS: No current outpatient medications on file. (Ophthalmic Drugs)   No current facility-administered medications for this visit. (Ophthalmic Drugs)   Current Outpatient Medications (Other)  Medication Sig   ALPRAZolam  (XANAX ) 0.25 MG tablet Take 0.25 mg by mouth daily as needed for anxiety.   amLODipine  (NORVASC ) 5 MG tablet Take 5 mg by mouth 2 (two) times daily.   aspirin  EC 81 MG tablet Take 1 tablet (81 mg total) by mouth daily.   cholecalciferol  (VITAMIN D ) 1000 units tablet Take 1,000 Units by mouth 2 (two) times daily.   dapagliflozin propanediol (FARXIGA) 5 MG TABS tablet Take 5 mg by mouth daily.   denosumab  (PROLIA ) 60 MG/ML SOLN injection Inject 60 mg into the skin every 6 (six) months.    escitalopram  (LEXAPRO ) 10 MG tablet Take 10 mg by mouth  daily.   estradiol  (ESTRACE ) 0.1 MG/GM vaginal cream Place 1 Applicatorful vaginally daily as needed (vaginal irritation).   famotidine  (PEPCID ) 40 MG tablet Take 40 mg by mouth daily.   furosemide  (LASIX ) 20 MG tablet Take 20 mg by mouth every Wednesday.   gabapentin  (NEURONTIN ) 300 MG capsule Take 300 mg by mouth at bedtime as needed (pain).   levothyroxine  (SYNTHROID , LEVOTHROID) 100 MCG tablet Take 100 mcg by mouth daily before breakfast.    lidocaine -prilocaine  (EMLA ) cream Apply 1 application topically as needed (apply prior to port a cath access).   Magnesium  500 MG TABS Take 500 mg by mouth every morning.    mirtazapine  (REMERON ) 15 MG tablet Take 15 mg by mouth as needed.   Multiple Vitamin (MULTIVITAMIN WITH MINERALS) TABS tablet Take 1 tablet by mouth daily. Centrum Silver   nystatin  cream (MYCOSTATIN ) Apply 1 application topically daily as needed (Yeast infection).   olmesartan (BENICAR) 20 MG tablet Take 20 mg by mouth daily.   pantoprazole  (PROTONIX ) 40 MG tablet Take 40 mg by mouth daily.   rivaroxaban  (XARELTO ) 20 MG TABS tablet Take 1 tablet (20 mg total) by mouth daily with supper.   rosuvastatin  (CRESTOR ) 20 MG tablet Take 20 mg by mouth every morning.   sodium bicarbonate  650 MG tablet Take  650 mg by mouth 2 (two) times daily.   TRESIBA FLEXTOUCH 200 UNIT/ML SOPN Inject 25-30 Units as directed at bedtime. Titrate according to fasting blood glucose not to exceed 50 units a day   vitamin B-12 (CYANOCOBALAMIN ) 1000 MCG tablet Take 1,000 mcg by mouth daily.   vitamin C  (ASCORBIC ACID ) 250 MG tablet Take 250 mg by mouth daily.   vitamin E  400 UNIT capsule Take 400 Units by mouth daily.   zolpidem  (AMBIEN ) 10 MG tablet Take 10 mg by mouth at bedtime.   No current facility-administered medications for this visit. (Other)   REVIEW OF SYSTEMS: ROS   Positive for: Gastrointestinal, Musculoskeletal, Endocrine, Cardiovascular, Eyes, Respiratory Negative for: Constitutional,  Neurological, Skin, Genitourinary, HENT, Psychiatric, Allergic/Imm, Heme/Lymph Last edited by Alise Appl, COT on 05/02/2023  1:41 PM.       ALLERGIES Allergies  Allergen Reactions   Ace Inhibitors Other (See Comments)    Unknown  Other Reaction(s): Intolerance    PAST MEDICAL HISTORY Past Medical History:  Diagnosis Date   Anemia    Anxiety    Arthritis    Gout   Cataracts, both eyes    Diabetic retinopathy (HCC)    NPDR OU   Diverticulitis    GERD (gastroesophageal reflux disease)    Gout    Headache    h/o migraines   History of fracture of patella    right knee   History of positive PPD    Patient always shows positive   Hyperlipidemia    Hypertension    Hypertensive retinopathy    OU   Hypothyroidism    Lichen sclerosus 12/30/2013   of vulva   Metatarsal fracture    Nausea and vomiting 04/11/2023   Neuropathy    Osteopenia    Peripheral vascular disease (HCC)    Polyneuropathy    numbness and tingling in feet and toes   Renal insufficiency    Stage 3   Sleep apnea    does not use cpap-lost weight    Type 2 diabetes mellitus, uncontrolled    Past Surgical History:  Procedure Laterality Date   ABDOMINAL HYSTERECTOMY     AMPUTATION TOE Right 05/08/2020   Procedure: AMPUTATION TOE-Right 4th Toe;  Surgeon: Pink Bridges, DPM;  Location: [facility];  Service: Podiatry;  Laterality: Right;   AMPUTATION TOE Left 04/22/2021   Procedure: AMPUTATION TOE - 4TH METARSOPHANGEAL JOINT;  Surgeon: Pink Bridges, DPM;  Location: [facility];  Service: Podiatry;  Laterality: Left;   APPENDECTOMY     BREAST REDUCTION SURGERY  2001   CATARACT EXTRACTION     CESAREAN SECTION  1976   COLONOSCOPY  03/05/2013   Nml - due for repeat 03/06/2018   COLONOSCOPY WITH PROPOFOL  N/A 03/18/2019   Procedure: COLONOSCOPY WITH PROPOFOL ;  Surgeon: Toledo, Alphonsus Jeans, MD;  Location: [facility];  Service: Gastroenterology;  Laterality: N/A;   DIAGNOSTIC LAPAROSCOPY     DILATION AND  CURETTAGE OF UTERUS  1989   ENDARTERECTOMY FEMORAL Bilateral 03/09/2018   Procedure: ENDARTERECTOMY FEMORAL;  Surgeon: Celso College, MD;  Location: [facility];  Service: Vascular;  Laterality: Bilateral;   ENDARTERECTOMY POPLITEAL Left 03/09/2018   Procedure: ENDARTERECTOMY POPLITEAL AND SFA;  Surgeon: Celso College, MD;  Location: [facility];  Service: Vascular;  Laterality: Left;   ESOPHAGOGASTRODUODENOSCOPY  03/05/2013   ESOPHAGOGASTRODUODENOSCOPY (EGD) WITH PROPOFOL  N/A 03/18/2019   Procedure: ESOPHAGOGASTRODUODENOSCOPY (EGD) WITH PROPOFOL ;  Surgeon: Toledo, Alphonsus Jeans, MD;  Location: [facility];  Service: Gastroenterology;  Laterality: N/A;   EYE SURGERY     Eyelid Surgery  2012   INTRAMEDULLARY (IM) NAIL INTERTROCHANTERIC Left 10/30/2015   Procedure: INTRAMEDULLARY (IM) NAIL INTERTROCHANTRIC ;  Surgeon: Molli Angelucci, MD;  Location: [facility];  Service: Orthopedics;  Laterality: Left;   KYPHOPLASTY N/A 10/25/2018   Procedure: L4 KYPHOPLASTY;  Surgeon: Molli Angelucci, MD;  Location: [facility];  Service: Orthopedics;  Laterality: N/A;   LAPAROSCOPIC HYSTERECTOMY  2000   total   LOWER EXTREMITY ANGIOGRAPHY Left 03/08/2017   Procedure: LOWER EXTREMITY ANGIOGRAPHY;  Surgeon: Celso College, MD;  Location: [facility];  Service: Cardiovascular;  Laterality: Left;   LOWER EXTREMITY ANGIOGRAPHY Left 10/30/2017   Procedure: LOWER EXTREMITY ANGIOGRAPHY;  Surgeon: Celso College, MD;  Location: [facility];  Service: Cardiovascular;  Laterality: Left;   LOWER EXTREMITY ANGIOGRAPHY Right 03/08/2018   Procedure: LOWER EXTREMITY ANGIOGRAPHY;  Surgeon: Celso College, MD;  Location: [facility];  Service: Cardiovascular;  Laterality: Right;   LOWER EXTREMITY ANGIOGRAPHY Left 10/01/2018   Procedure: LOWER EXTREMITY ANGIOGRAPHY;  Surgeon: Celso College, MD;  Location: [facility];  Service: Cardiovascular;  Laterality: Left;   LOWER EXTREMITY ANGIOGRAPHY Right 10/08/2018   Procedure: LOWER  EXTREMITY ANGIOGRAPHY;  Surgeon: Celso College, MD;  Location: [facility];  Service: Cardiovascular;  Laterality: Right;   LOWER EXTREMITY ANGIOGRAPHY Right 05/07/2020   Procedure: Lower Extremity Angiography;  Surgeon: Celso College, MD;  Location: [facility];  Service: Cardiovascular;  Laterality: Right;   LYSIS OF ADHESION  01/25/2021   Procedure: LYSIS OF ADHESION;  Surgeon: Flynn Hylan, MD;  Location: [facility];  Service: General;;   PERIPHERAL VASCULAR INTERVENTION  03/08/2018   Procedure: PERIPHERAL VASCULAR INTERVENTION;  Surgeon: Celso College, MD;  Location: [facility];  Service: Cardiovascular;;   PORTA CATH INSERTION N/A 02/17/2020   Procedure: PORTA CATH INSERTION;  Surgeon: Celso College, MD;  Location: [facility];  Service: Cardiovascular;  Laterality: N/A;   REDUCTION MAMMAPLASTY  1997   SACROPLASTY N/A 10/25/2018   Procedure: S1 SACROPLASTY;  Surgeon: Molli Angelucci, MD;  Location: [facility];  Service: Orthopedics;  Laterality: N/A;   FAMILY HISTORY Family History  Problem Relation Age of Onset   Coronary artery disease Father    Heart attack Father    Coronary artery disease Mother    Heart attack Mother    Ovarian cancer Sister 62       sister had hormonal therapy for IVF txs-which increased risk factor for ovarian cancer   Breast cancer Neg Hx    SOCIAL HISTORY Social History   Tobacco Use   Smoking status: Former    Current packs/day: 0.00    Average packs/day: 1 pack/day for 20.0 years (20.0 ttl pk-yrs)    Types: Cigarettes    Start date: 03/07/1976    Quit date: 03/07/1996    Years since quitting: 27.1    Passive exposure: Past   Smokeless tobacco: Never   Tobacco comments:    started smoking at age 83 but stopped smoking in 2000  Vaping Use   Vaping status: Never Used  Substance Use Topics   Alcohol use: No    Alcohol/week: 0.0 standard drinks of alcohol   Drug use: No       OPHTHALMIC EXAM: Base Eye Exam      Visual Acuity (Snellen - Linear)       Right Left  Dist [place] 20/30 -1 20/25   Dist ph [place] NI NI         Tonometry (Tonopen, 1:48 PM)       Right Left   Pressure 15 17         Pupils       Pupils Dark Light Shape React APD   Right PERRL 3 2 Round Brisk None   Left PERRL 3 2 Round Brisk None         Visual Fields       Left Right    Full Full         Neuro/Psych     Oriented x3: Yes   Mood/Affect: Normal         Dilation     Both eyes: 2.5% Phenylephrine  @ 1:48 PM           Slit Lamp and Fundus Exam     External Exam       Right Left   External Normal Normal         Slit Lamp Exam       Right Left   Lids/Lashes dermatochalasis dermatochalasis   Conjunctiva/Sclera White and quiet Trace Injection   Cornea arcus; well healed cataract wound; 2-3+ diffuse Punctate epithelial erosions, decreased TBUT, mild Anterior basement membrane dystrophy superiorly arcus; well healed cataract wound, 1-2+ Punctate epithelial erosions, tear film debris   Anterior Chamber Deep and quiet Deep and quiet   Iris Round and dilated Round and dilated   Lens PCIOL; open PC PCIOL; open PC   Anterior Vitreous syneresis, Posterior vitreous detachment, vitreous condensations inferiorly syneresis, Posterior vitreous detachment         Fundus Exam       Right Left   Disc Superior hyperemia -- stably improved, mild Pallor Pink and Sharp   C/D Ratio 0.6 0.5   Macula Flat, Blunted foveal reflex, +cystic changes -- slightly increased, +Epiretinal membrane, minimal MA flat; good foveal reflex, no heme or edema, small pigment clump IT to fovea   Vessels attenuated, Tortuous attenuated, Tortuous   Periphery Attached; scattered DBH -- greastest temporal periphery- improved Attached, no heme           IMAGING AND PROCEDURES  Imaging and Procedures for 04/25/17  OCT, Retina - OU - Both Eyes       Right Eye Quality was good. Central Foveal Thickness: 378. Progression has  worsened. Findings include no IRF, no SRF, abnormal foveal contour, epiretinal membrane (Interval increase in IRF / cystic changes temporal fovea and macula ).   Left Eye Quality was good. Central Foveal Thickness: 292. Progression has been stable. Findings include normal foveal contour, no IRF, no SRF.   Notes *Images captured and stored on drive  Diagnosis / Impression:  OD: Interval increase in IRF / cystic changes temporal fovea and macula  OS: NFP; no IRF/SRF--stable  Clinical management:  See below  Abbreviations: NFP - Normal foveal profile. CME - cystoid macular edema. PED - pigment epithelial detachment. IRF - intraretinal fluid. SRF - subretinal fluid. EZ - ellipsoid zone. ERM - epiretinal membrane. ORA - outer retinal atrophy. ORT - outer retinal tubulation. SRHM - subretinal hyper-reflective material      Intravitreal Injection, Pharmacologic Agent - OD - Right Eye       Time Out 05/02/2023. 3:42 PM. Confirmed correct patient, procedure, site, and patient consented.   Anesthesia Topical anesthesia was used. Anesthetic medications included Lidocaine  2%, Proparacaine 0.5%.   Procedure Preparation included 5%  betadine to ocular surface, eyelid speculum. A (30 g) needle was used.   Injection: 8 mg aflibercept  8 MG/0.07ML   Route: Intravitreal, Site: Right Eye   NDC: [id number], Lot: [id number], Expiration date: 04/02/2024, Waste: 0 mL   Post-op Post injection exam found visual acuity of at least counting fingers. The patient tolerated the procedure well. There were no complications. The patient received written and verbal post procedure care education. Post injection medications were not given.            ASSESSMENT/PLAN:    ICD-10-CM   1. Branch retinal vein occlusion of right eye with macular edema  H34.8310 OCT, Retina - OU - Both Eyes    2. Both eyes affected by mild nonproliferative diabetic retinopathy with macular edema, associated with type 2 diabetes  mellitus (HCC)  U98.1191 Intravitreal Injection, Pharmacologic Agent - OD - Right Eye    aflibercept  (EYLEA  HD) ophthalmic injection 8 mg    3. Long term (current) use of oral hypoglycemic drugs  Z79.84     4. Current use of insulin  (HCC)  Z79.4     5. Essential hypertension  I10     6. Hypertensive retinopathy of both eyes  H35.033     7. Epiretinal membrane (ERM) of right eye  H35.371     8. Pseudophakia of both eyes  Z96.1      1. BRVO w/ CME OD - by history, pt states symptoms first noticed 2 wks prior to presentation, but reports changes may have occurred prior  - initial exam with differential tortuosity of vessels (OD > OS) - FA (02.10.20) shows mild late staining / leakage in macula, staining / leakage of disc -- improving CME - differential includes DM2 (DME), hypertensive retinopathy, inflammatory etiology / uveitis - S/P IVA OD #1 (02.08.19), #2 (03.11.19), #3 (04.09.19), #4 (05.20.19), #5 (02.10.20) - gave IVA OD on 2.10.20 due to pending Eylea4U for 2020 -- resulted in increased IRF/CME - review of OCT show persistent IRF and cystic changes --  resistance to IVA  - June 2019 -- switched therapies:  ============================== - S/P IVE OD #1 (06.24.19), #2 (07.24.19), #3 (09.04.19), #4 (10.30.19),#5 (12.30.19), #6 (03.23.20), #7 (05.05.20), #8 (07.16.20), #9 (07.17.20), #10 (08.28.20), #11 (10.13.20), # 12 (11.17.20), #13 (2.8.21), #14 (03.09.21), #15 (04.13.21), #16 (05.11.21), #17 (06.17.21), #18 (07.23.21), #19 (08.30.21), #20 (10.04.21), #21 (11.08.21), #22 (12.08.21), #23 (01.31.22), #24 (02.28.22), #25 (04.01.22), #26 (06.15.22), #27 (07.13.22), #28 (08.17.22), #29 (09.21.22), #30 (10.19.22), #31 (11.16.22), #32 (12.16.22), #33 (01.13.23), #34 (02.10.23), #35 (03.15.23), #36 (04.14.23), #37 (05.15.23), #38 (06.12.23), #39 (07.10.23), #40 (08.07.23), #41 (09.06.23), #42 (10.04.23), #43 (11.01.23) -- IVE resistance ================================ **interval increase  in IRF at 5 weeks on 07.01.24 and at 6 wks on 02.28.25 (IVV)** - s/p IVV OD #1 (12.04.23), #2 (01.03.24), #3 (01.31.24), #4 (02.28.24), #5 (03.29.24), #6 (04.24.24), #7 (05.28.24), #8 (07.01.24), #9 (07.29.24), #10 (08.26.24), #11 (09.26.24), #12 (10.25.24), #13 (11.25.24), #14 (01.07.24), #15 (02.18.25) - s/p IVE HD OD #1 (03.25.25) - OCT today shows interval increase in IRF / cystic changes temporal fovea and macula at 5 weeks  - BCVA OD stable at 20/30  - IVE HD OD #2 as below  - f/u in 4 weeks  -- DFE/OCT/possible injection  2-4. Mild nonproliferative diabetic retinopathy, both eyes  - s/p IVE HD #1 (03.25.25) - A1c 7.6 on 04.14.25  - likely DME component contributing to CME OD  - OD shows interval increase in central IRF   - OS with minimal diabetic retinopathy  -  recommend IVE HD OD #2 today, 04.29.25 w/ f/u dec to 4 weeks  - RBA of procedure discussed, questions answered  - see procedure note   - Eylea  HD informed consent form obtained, signed and scanned on 03.25.25  - pt approved for Eylea  HD through Medicare and supplement  - monitor OS  - f/u 4 weeks, DFE, OCT  5,6. Hypertensive retinopathy OU - stable  - as above, may have contributing to CME OD  - discussed importance of tight BP control  - monitor  7. Epiretinal membrane, right eye   - stable nasal ERM  - no indication for surgery at this time  8. Pseudophakia OU  - s/p CE/IOL OU by cataract surgeon in [facility]  - doing well  - monitor  Ophthalmic Meds Ordered this visit:  Meds ordered this encounter  Medications   aflibercept  (EYLEA  HD) ophthalmic injection 8 mg     Return in about 4 weeks (around 05/30/2023) for f/u DME + BRVO OD, DFE, OCT, Possible Injxn.   This document serves as a record of services personally performed by Jeanice Millard, MD, PhD. It was created on their behalf by Olene Berne, COT an ophthalmic technician. The creation of this record is the provider's dictation and/or activities  during the visit.    Electronically signed by:  Olene Berne, COT  05/02/23 4:56 PM  This document serves as a record of services personally performed by Jeanice Millard, MD, PhD. It was created on their behalf by Morley Arabia. Bevin Bucks, OA an ophthalmic technician. The creation of this record is the provider's dictation and/or activities during the visit.    Electronically signed by: Morley Arabia. Bevin Bucks, OA 05/02/23 4:56 PM  Jeanice Millard, M.D., Ph.D. Diseases & Surgery of the Retina and Vitreous [facility]  I have reviewed the above documentation for accuracy and completeness, and I agree with the above. Jeanice Millard, M.D., Ph.D. 05/02/23 4:57 PM   Abbreviations: M myopia (nearsighted); A astigmatism; H hyperopia (farsighted); P presbyopia; Mrx spectacle prescription;  CTL contact lenses; OD right eye; OS left eye; OU both eyes  XT exotropia; ET esotropia; PEK punctate epithelial keratitis; PEE punctate epithelial erosions; DES dry eye syndrome; MGD meibomian gland dysfunction; ATs artificial tears; PFAT's preservative free artificial tears; NSC nuclear sclerotic cataract; PSC posterior subcapsular cataract; ERM epi-retinal membrane; PVD posterior vitreous detachment; RD retinal detachment; DM diabetes mellitus; DR diabetic retinopathy; NPDR non-proliferative diabetic retinopathy; PDR proliferative diabetic retinopathy; CSME clinically significant macular edema; DME diabetic macular edema; dbh dot blot hemorrhages; CWS cotton wool spot; POAG primary open angle glaucoma; C/D cup-to-disc ratio; HVF humphrey visual field; GVF goldmann visual field; OCT optical coherence tomography; IOP intraocular pressure; BRVO Branch retinal vein occlusion; CRVO central retinal vein occlusion; CRAO central retinal artery occlusion; BRAO branch retinal artery occlusion; RT retinal tear; SB scleral buckle; PPV pars plana vitrectomy; VH Vitreous hemorrhage; PRP panretinal laser photocoagulation;  IVK intravitreal kenalog; VMT vitreomacular traction; MH Macular hole;  NVD neovascularization of the disc; NVE neovascularization elsewhere; AREDS age related eye disease study; ARMD age related macular degeneration; POAG primary open angle glaucoma; EBMD epithelial/anterior basement membrane dystrophy; ACIOL anterior chamber intraocular lens; IOL intraocular lens; PCIOL posterior chamber intraocular lens; Phaco/IOL phacoemulsification with intraocular lens placement; PRK photorefractive keratectomy; LASIK laser assisted in situ keratomileusis; HTN hypertension; DM diabetes mellitus; COPD chronic obstructive pulmonary disease

## 2023-04-18 NOTE — Assessment & Plan Note (Signed)
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  If Retacrit/iron does not improve consider bone marrow biopsy. On gentle iron [iron biglycinate; 28 mg ]1 pill a day.    #  hemoglobin 8.3 [recent sepsis]  HOLD retacrit; Proceed with venofer.   # Etiology-CKD-III--IV; overall worsened [Dr.Kolluru];- stable.   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  #Poor IV access/Mediport placement- stable.   Schedule retacrit/venofer on separate days  # DISPOSITION:  # Venofer today; NO retacrit.   # in 2 months- port/ labs- H&H- possible retacrit.   # follow up 4 months  MD; port; labs- cbc/bmp; iron studies;ferritin; possible retacrit OR venofer- Dr.B

## 2023-05-02 ENCOUNTER — Ambulatory Visit (INDEPENDENT_AMBULATORY_CARE_PROVIDER_SITE_OTHER): Admitting: Ophthalmology

## 2023-05-02 ENCOUNTER — Encounter (INDEPENDENT_AMBULATORY_CARE_PROVIDER_SITE_OTHER): Payer: Self-pay | Admitting: Ophthalmology

## 2023-05-02 DIAGNOSIS — H35371 Puckering of macula, right eye: Secondary | ICD-10-CM

## 2023-05-02 DIAGNOSIS — H34831 Tributary (branch) retinal vein occlusion, right eye, with macular edema: Secondary | ICD-10-CM

## 2023-05-02 DIAGNOSIS — Z7984 Long term (current) use of oral hypoglycemic drugs: Secondary | ICD-10-CM

## 2023-05-02 DIAGNOSIS — E113213 Type 2 diabetes mellitus with mild nonproliferative diabetic retinopathy with macular edema, bilateral: Secondary | ICD-10-CM

## 2023-05-02 DIAGNOSIS — Z961 Presence of intraocular lens: Secondary | ICD-10-CM

## 2023-05-02 DIAGNOSIS — H35033 Hypertensive retinopathy, bilateral: Secondary | ICD-10-CM

## 2023-05-02 DIAGNOSIS — Z794 Long term (current) use of insulin: Secondary | ICD-10-CM

## 2023-05-02 DIAGNOSIS — I10 Essential (primary) hypertension: Secondary | ICD-10-CM

## 2023-05-02 MED ORDER — AFLIBERCEPT 8 MG/0.07ML IZ SOLN
8.0000 mg | INTRAVITREAL | Status: AC | PRN
  Administered 2023-05-02: 8 mg via INTRAVITREAL

## 2023-05-05 ENCOUNTER — Other Ambulatory Visit: Payer: Self-pay | Admitting: Internal Medicine

## 2023-05-05 DIAGNOSIS — G9349 Other encephalopathy: Secondary | ICD-10-CM

## 2023-05-08 ENCOUNTER — Encounter: Payer: Self-pay | Admitting: Internal Medicine

## 2023-05-09 ENCOUNTER — Ambulatory Visit
Admission: RE | Admit: 2023-05-09 | Discharge: 2023-05-09 | Disposition: A | Discharge status: Home / Self Care | Source: Ambulatory Visit | Attending: Internal Medicine | Admitting: Internal Medicine

## 2023-05-09 ENCOUNTER — Other Ambulatory Visit

## 2023-05-09 DIAGNOSIS — G9349 Other encephalopathy: Secondary | ICD-10-CM | POA: Insufficient documentation

## 2023-05-18 NOTE — Progress Notes (Signed)
 [facility] - Clinic Note  05/30/2023     CHIEF COMPLAINT Patient presents for Retina Follow Up   HISTORY OF PRESENT ILLNESS: Cheryl Leonard is a 78 y.o. female who presents to the clinic today for:    HPI     Retina Follow Up   Patient presents with  CRVO/BRVO.  In right eye.  This started 5 weeks ago.  Duration of 5 weeks.  Since onset it is stable.  I, the attending physician,  performed the HPI with the patient and updated documentation appropriately.        Comments   5 week retina follow up BRVO OD and IVE HD OD pt is reporting no vision changes noticed she denies any flashes or floaters       Last edited by Ronelle Coffee, MD on 05/31/2023  1:35 AM.     Pt states   Referring physician: Sari Cunning, MD [phone] [facility] [address] [facility] [place],  [place] [address]   HISTORICAL INFORMATION:   Selected notes from the MEDICAL RECORD NUMBER Referred by Dr. Wayna Hails for concern of DME OD Lab Results  Component Value Date   HGBA1C 7.3 (H) 04/07/2023     CURRENT MEDICATIONS: No current outpatient medications on file. (Ophthalmic Drugs)   No current facility-administered medications for this visit. (Ophthalmic Drugs)   Current Outpatient Medications (Other)  Medication Sig   ALPRAZolam  (XANAX ) 0.25 MG tablet Take 0.25 mg by mouth daily as needed for anxiety.   amLODipine  (NORVASC ) 5 MG tablet Take 5 mg by mouth 2 (two) times daily.   aspirin  EC 81 MG tablet Take 1 tablet (81 mg total) by mouth daily.   cholecalciferol  (VITAMIN D ) 1000 units tablet Take 1,000 Units by mouth 2 (two) times daily.   dapagliflozin propanediol (FARXIGA) 5 MG TABS tablet Take 5 mg by mouth daily.   denosumab  (PROLIA ) 60 MG/ML SOLN injection Inject 60 mg into the skin every 6 (six) months.    escitalopram  (LEXAPRO ) 10 MG tablet Take 10 mg by mouth daily.   estradiol  (ESTRACE ) 0.1 MG/GM vaginal cream Place 1 Applicatorful vaginally daily as  needed (vaginal irritation).   famotidine  (PEPCID ) 40 MG tablet Take 40 mg by mouth daily.   furosemide  (LASIX ) 20 MG tablet Take 20 mg by mouth every Wednesday.   gabapentin  (NEURONTIN ) 300 MG capsule Take 300 mg by mouth at bedtime as needed (pain).   levothyroxine  (SYNTHROID , LEVOTHROID) 100 MCG tablet Take 100 mcg by mouth daily before breakfast.    lidocaine -prilocaine  (EMLA ) cream Apply 1 application topically as needed (apply prior to port a cath access).   Magnesium  500 MG TABS Take 500 mg by mouth every morning.    mirtazapine  (REMERON ) 15 MG tablet Take 15 mg by mouth as needed.   Multiple Vitamin (MULTIVITAMIN WITH MINERALS) TABS tablet Take 1 tablet by mouth daily. Centrum Silver   nystatin  cream (MYCOSTATIN ) Apply 1 application topically daily as needed (Yeast infection).   olmesartan (BENICAR) 20 MG tablet Take 20 mg by mouth daily.   pantoprazole  (PROTONIX ) 40 MG tablet Take 40 mg by mouth daily.   rivaroxaban  (XARELTO ) 20 MG TABS tablet Take 1 tablet (20 mg total) by mouth daily with supper.   rosuvastatin  (CRESTOR ) 20 MG tablet Take 20 mg by mouth every morning.   sodium bicarbonate  650 MG tablet Take 650 mg by mouth 2 (two) times daily.   TRESIBA FLEXTOUCH 200 UNIT/ML SOPN Inject 25-30 Units as directed  at bedtime. Titrate according to fasting blood glucose not to exceed 50 units a day   vitamin B-12 (CYANOCOBALAMIN ) 1000 MCG tablet Take 1,000 mcg by mouth daily.   vitamin C  (ASCORBIC ACID ) 250 MG tablet Take 250 mg by mouth daily.   vitamin E  400 UNIT capsule Take 400 Units by mouth daily.   zolpidem  (AMBIEN ) 10 MG tablet Take 10 mg by mouth at bedtime.   No current facility-administered medications for this visit. (Other)   REVIEW OF SYSTEMS: ROS   Positive for: Gastrointestinal, Musculoskeletal, Endocrine, Cardiovascular, Eyes, Respiratory Negative for: Constitutional, Neurological, Skin, Genitourinary, HENT, Psychiatric, Allergic/Imm, Heme/Lymph Last edited by  Alise Appl, COT on 05/30/2023  1:20 PM.        ALLERGIES Allergies  Allergen Reactions   Ace Inhibitors Other (See Comments)    Unknown  Other Reaction(s): Intolerance    PAST MEDICAL HISTORY Past Medical History:  Diagnosis Date   Anemia    Anxiety    Arthritis    Gout   Cataracts, both eyes    Diabetic retinopathy (HCC)    NPDR OU   Diverticulitis    GERD (gastroesophageal reflux disease)    Gout    Headache    h/o migraines   History of fracture of patella    right knee   History of positive PPD    Patient always shows positive   Hyperlipidemia    Hypertension    Hypertensive retinopathy    OU   Hypothyroidism    Lichen sclerosus 12/30/2013   of vulva   Metatarsal fracture    Nausea and vomiting 04/11/2023   Neuropathy    Osteopenia    Peripheral vascular disease (HCC)    Polyneuropathy    numbness and tingling in feet and toes   Renal insufficiency    Stage 3   Sleep apnea    does not use cpap-lost weight    Type 2 diabetes mellitus, uncontrolled    Past Surgical History:  Procedure Laterality Date   ABDOMINAL HYSTERECTOMY     AMPUTATION TOE Right 05/08/2020   Procedure: AMPUTATION TOE-Right 4th Toe;  Surgeon: Pink Bridges, DPM;  Location: [facility];  Service: Podiatry;  Laterality: Right;   AMPUTATION TOE Left 04/22/2021   Procedure: AMPUTATION TOE - 4TH METARSOPHANGEAL JOINT;  Surgeon: Pink Bridges, DPM;  Location: [facility];  Service: Podiatry;  Laterality: Left;   APPENDECTOMY     BREAST REDUCTION SURGERY  2001   CATARACT EXTRACTION     CESAREAN SECTION  1976   COLONOSCOPY  03/05/2013   Nml - due for repeat 03/06/2018   COLONOSCOPY WITH PROPOFOL  N/A 03/18/2019   Procedure: COLONOSCOPY WITH PROPOFOL ;  Surgeon: Toledo, Alphonsus Jeans, MD;  Location: [facility];  Service: Gastroenterology;  Laterality: N/A;   DIAGNOSTIC LAPAROSCOPY     DILATION AND CURETTAGE OF UTERUS  1989   ENDARTERECTOMY FEMORAL Bilateral 03/09/2018   Procedure:  ENDARTERECTOMY FEMORAL;  Surgeon: Celso College, MD;  Location: [facility];  Service: Vascular;  Laterality: Bilateral;   ENDARTERECTOMY POPLITEAL Left 03/09/2018   Procedure: ENDARTERECTOMY POPLITEAL AND SFA;  Surgeon: Celso College, MD;  Location: [facility];  Service: Vascular;  Laterality: Left;   ESOPHAGOGASTRODUODENOSCOPY  03/05/2013   ESOPHAGOGASTRODUODENOSCOPY (EGD) WITH PROPOFOL  N/A 03/18/2019   Procedure: ESOPHAGOGASTRODUODENOSCOPY (EGD) WITH PROPOFOL ;  Surgeon: Toledo, Alphonsus Jeans, MD;  Location: [facility];  Service: Gastroenterology;  Laterality: N/A;   EYE SURGERY     Eyelid Surgery  2012   INTRAMEDULLARY (IM) NAIL  INTERTROCHANTERIC Left 10/30/2015   Procedure: INTRAMEDULLARY (IM) NAIL INTERTROCHANTRIC ;  Surgeon: Molli Angelucci, MD;  Location: [facility];  Service: Orthopedics;  Laterality: Left;   KYPHOPLASTY N/A 10/25/2018   Procedure: L4 KYPHOPLASTY;  Surgeon: Molli Angelucci, MD;  Location: [facility];  Service: Orthopedics;  Laterality: N/A;   LAPAROSCOPIC HYSTERECTOMY  2000   total   LOWER EXTREMITY ANGIOGRAPHY Left 03/08/2017   Procedure: LOWER EXTREMITY ANGIOGRAPHY;  Surgeon: Celso College, MD;  Location: [facility];  Service: Cardiovascular;  Laterality: Left;   LOWER EXTREMITY ANGIOGRAPHY Left 10/30/2017   Procedure: LOWER EXTREMITY ANGIOGRAPHY;  Surgeon: Celso College, MD;  Location: [facility];  Service: Cardiovascular;  Laterality: Left;   LOWER EXTREMITY ANGIOGRAPHY Right 03/08/2018   Procedure: LOWER EXTREMITY ANGIOGRAPHY;  Surgeon: Celso College, MD;  Location: [facility];  Service: Cardiovascular;  Laterality: Right;   LOWER EXTREMITY ANGIOGRAPHY Left 10/01/2018   Procedure: LOWER EXTREMITY ANGIOGRAPHY;  Surgeon: Celso College, MD;  Location: [facility];  Service: Cardiovascular;  Laterality: Left;   LOWER EXTREMITY ANGIOGRAPHY Right 10/08/2018   Procedure: LOWER EXTREMITY ANGIOGRAPHY;  Surgeon: Celso College, MD;  Location: [facility];   Service: Cardiovascular;  Laterality: Right;   LOWER EXTREMITY ANGIOGRAPHY Right 05/07/2020   Procedure: Lower Extremity Angiography;  Surgeon: Celso College, MD;  Location: [facility];  Service: Cardiovascular;  Laterality: Right;   LYSIS OF ADHESION  01/25/2021   Procedure: LYSIS OF ADHESION;  Surgeon: Flynn Hylan, MD;  Location: [facility];  Service: General;;   PERIPHERAL VASCULAR INTERVENTION  03/08/2018   Procedure: PERIPHERAL VASCULAR INTERVENTION;  Surgeon: Celso College, MD;  Location: [facility];  Service: Cardiovascular;;   PORTA CATH INSERTION N/A 02/17/2020   Procedure: PORTA CATH INSERTION;  Surgeon: Celso College, MD;  Location: [facility];  Service: Cardiovascular;  Laterality: N/A;   REDUCTION MAMMAPLASTY  1997   SACROPLASTY N/A 10/25/2018   Procedure: S1 SACROPLASTY;  Surgeon: Molli Angelucci, MD;  Location: [facility];  Service: Orthopedics;  Laterality: N/A;   FAMILY HISTORY Family History  Problem Relation Age of Onset   Coronary artery disease Father    Heart attack Father    Coronary artery disease Mother    Heart attack Mother    Ovarian cancer Sister 21       sister had hormonal therapy for IVF txs-which increased risk factor for ovarian cancer   Breast cancer Neg Hx    SOCIAL HISTORY Social History   Tobacco Use   Smoking status: Former    Current packs/day: 0.00    Average packs/day: 1 pack/day for 20.0 years (20.0 ttl pk-yrs)    Types: Cigarettes    Start date: 03/07/1976    Quit date: 03/07/1996    Years since quitting: 27.2    Passive exposure: Past   Smokeless tobacco: Never   Tobacco comments:    started smoking at age 89 but stopped smoking in 2000  Vaping Use   Vaping status: Never Used  Substance Use Topics   Alcohol use: No    Alcohol/week: 0.0 standard drinks of alcohol   Drug use: No       OPHTHALMIC EXAM: Base Eye Exam     Visual Acuity (Snellen - Linear)       Right Left   Dist [place] 20/40 -2 20/25 +1   Dist  ph Delta NI NI  Tonometry (Tonopen, 1:24 PM)       Right Left   Pressure 12 16         Pupils       Pupils Dark Light Shape React APD   Right PERRL 3 2 Round Brisk None   Left PERRL 3 2 Round Brisk None         Visual Fields       Left Right    Full Full         Extraocular Movement       Right Left    Full, Ortho Full, Ortho         Neuro/Psych     Oriented x3: Yes   Mood/Affect: Normal         Dilation     Both eyes: 2.5% Phenylephrine  @ 1:23 PM           Slit Lamp and Fundus Exam     External Exam       Right Left   External Normal Normal         Slit Lamp Exam       Right Left   Lids/Lashes dermatochalasis dermatochalasis   Conjunctiva/Sclera White and quiet Trace Injection   Cornea arcus; well healed cataract wound; trace Punctate epithelial erosions arcus; well healed cataract wound, trace PEE   Anterior Chamber Deep and quiet Deep and quiet   Iris Round and dilated Round and dilated   Lens PCIOL; open PC PCIOL; open PC   Anterior Vitreous syneresis, Posterior vitreous detachment, vitreous condensations inferiorly syneresis, Posterior vitreous detachment         Fundus Exam       Right Left   Disc Superior hyperemia -- stably improved, mild Pallor, vascular loops Pink and Sharp   C/D Ratio 0.6 0.5   Macula Flat, Blunted foveal reflex, +cystic changes -- improved, +Epiretinal membrane, minimal MA flat; good foveal reflex, no heme or edema, small pigment clump IT to fovea   Vessels attenuated, Tortuous attenuated, Tortuous   Periphery Attached; scattered DBH -- greastest temporal periphery- improved Attached, no heme           IMAGING AND PROCEDURES  Imaging and Procedures for 04/25/17  OCT, Retina - OU - Both Eyes        Right Eye Quality was borderline. Central Foveal Thickness: 354. Progression has improved. Findings include no SRF, abnormal foveal contour, epiretinal membrane, intraretinal fluid (Interval  improvement in IRF / cystic changes temporal fovea and macula ).   Left Eye Quality was good. Central Foveal Thickness: 290. Progression has been stable. Findings include normal foveal contour, no IRF, no SRF.   Notes  *Images captured and stored on drive  Diagnosis / Impression:  OD: Interval improvement in IRF / cystic changes temporal fovea and macula  OS: NFP; no IRF/SRF--stable  Clinical management:  See below  Abbreviations: NFP - Normal foveal profile. CME - cystoid macular edema. PED - pigment epithelial detachment. IRF - intraretinal fluid. SRF - subretinal fluid. EZ - ellipsoid zone. ERM - epiretinal membrane. ORA - outer retinal atrophy. ORT - outer retinal tubulation. SRHM - subretinal hyper-reflective material      Intravitreal Injection, Pharmacologic Agent - OD - Right Eye       Time Out 05/30/2023. 2:52 PM. Confirmed correct patient, procedure, site, and patient consented.   Anesthesia Topical anesthesia was used. Anesthetic medications included Lidocaine  2%, Proparacaine 0.5%.   Procedure Preparation included 5% betadine to ocular surface, eyelid speculum.  A (30 g) needle was used.   Injection: 8 mg aflibercept  8 MG/0.07ML   Route: Intravitreal, Site: Right Eye   NDC: [id number], Lot: [id number], Expiration date: 04/02/2024, Waste: 0 mL   Post-op Post injection exam found visual acuity of at least counting fingers. The patient tolerated the procedure well. There were no complications. The patient received written and verbal post procedure care education. Post injection medications were not given.             ASSESSMENT/PLAN:    ICD-10-CM   1. Branch retinal vein occlusion of right eye with macular edema  H34.8310 OCT, Retina - OU - Both Eyes    2. Both eyes affected by mild nonproliferative diabetic retinopathy with macular edema, associated with type 2 diabetes mellitus (HCC)  V78.4696 Intravitreal Injection, Pharmacologic Agent - OD - Right Eye     aflibercept  (EYLEA  HD) ophthalmic injection 8 mg    3. Long term (current) use of oral hypoglycemic drugs  Z79.84     4. Current use of insulin  (HCC)  Z79.4     5. Essential hypertension  I10     6. Hypertensive retinopathy of both eyes  H35.033     7. Epiretinal membrane (ERM) of right eye  H35.371     8. Pseudophakia of both eyes  Z96.1       1. BRVO w/ CME OD - by history, pt states symptoms first noticed 2 wks prior to presentation, but reports changes may have occurred prior  - initial exam with differential tortuosity of vessels (OD > OS) - FA (02.10.20) shows mild late staining / leakage in macula, staining / leakage of disc -- improving CME - differential includes DM2 (DME), hypertensive retinopathy, inflammatory etiology / uveitis - S/P IVA OD #1 (02.08.19), #2 (03.11.19), #3 (04.09.19), #4 (05.20.19), #5 (02.10.20) - gave IVA OD on 2.10.20 due to pending Eylea4U for 2020 -- resulted in increased IRF/CME - review of OCT show persistent IRF and cystic changes --  resistance to IVA  - June 2019 -- switched therapies:  ============================== - S/P IVE OD #1 (06.24.19), #2 (07.24.19), #3 (09.04.19), #4 (10.30.19),#5 (12.30.19), #6 (03.23.20), #7 (05.05.20), #8 (07.16.20), #9 (07.17.20), #10 (08.28.20), #11 (10.13.20), # 12 (11.17.20), #13 (2.8.21), #14 (03.09.21), #15 (04.13.21), #16 (05.11.21), #17 (06.17.21), #18 (07.23.21), #19 (08.30.21), #20 (10.04.21), #21 (11.08.21), #22 (12.08.21), #23 (01.31.22), #24 (02.28.22), #25 (04.01.22), #26 (06.15.22), #27 (07.13.22), #28 (08.17.22), #29 (09.21.22), #30 (10.19.22), #31 (11.16.22), #32 (12.16.22), #33 (01.13.23), #34 (02.10.23), #35 (03.15.23), #36 (04.14.23), #37 (05.15.23), #38 (06.12.23), #39 (07.10.23), #40 (08.07.23), #41 (09.06.23), #42 (10.04.23), #43 (11.01.23) -- IVE resistance ================================ **interval increase in IRF at 5 weeks on 07.01.24 and at 6 wks on 02.28.25 (IVV)** - s/p IVV OD #1  (12.04.23), #2 (01.03.24), #3 (01.31.24), #4 (02.28.24), #5 (03.29.24), #6 (04.24.24), #7 (05.28.24), #8 (07.01.24), #9 (07.29.24), #10 (08.26.24), #11 (09.26.24), #12 (10.25.24), #13 (11.25.24), #14 (01.07.24), #15 (02.18.25) -- IVV resistance - s/p IVE HD OD #1 (03.25.25), #2 (04.29.25) - OCT today shows interval improvement in IRF / cystic changes temporal fovea and macula at 5 weeks  - BCVA OD decreased to 20/40 from 20/30  - IVE HD OD #3 as below  - f/u in 4 weeks  -- DFE/OCT/possible injection  2-4. Mild nonproliferative diabetic retinopathy, both eyes  - s/p IVE HD #1 (03.25.25),  - A1c 7.3 on 04.04.25  - likely DME component contributing to CME OD  - OD shows interval improvement in central IRF   - OS  with minimal diabetic retinopathy  - recommend IVE HD OD #3 today, 05.27.25 w/ f/u in 4 weeks  - RBA of procedure discussed, questions answered  - see procedure note   - Eylea  HD informed consent form obtained, signed and scanned on 03.25.25  - pt approved for Eylea  HD through Medicare and supplement  - monitor OS  - f/u 4 weeks, DFE, OCT  5,6. Hypertensive retinopathy OU - stable  - as above, may have contributing to CME OD  - discussed importance of tight BP control  - monitor  7. Epiretinal membrane, right eye   - stable nasal ERM  - no indication for surgery at this time  8. Pseudophakia OU  - s/p CE/IOL OU by cataract surgeon in [facility]  - doing well  - monitor  Ophthalmic Meds Ordered this visit:  Meds ordered this encounter  Medications   aflibercept  (EYLEA  HD) ophthalmic injection 8 mg     Return in about 4 weeks (around 06/27/2023) for f/u BRVO OD, DFE, OCT, Possible Injxn.   This document serves as a record of services personally performed by Jeanice Millard, MD, PhD. It was created on their behalf by Olene Berne, COT an ophthalmic technician. The creation of this record is the provider's dictation and/or activities during the visit.    Electronically  signed by:  Olene Berne, COT  05/31/23 1:42 AM  This document serves as a record of services personally performed by Jeanice Millard, MD, PhD. It was created on their behalf by Morley Arabia. Bevin Bucks, OA an ophthalmic technician. The creation of this record is the provider's dictation and/or activities during the visit.    Electronically signed by: Morley Arabia. Bevin Bucks, OA 05/31/23 1:42 AM   Jeanice Millard, M.D., Ph.D. Diseases & Surgery of the Retina and Vitreous [facility]  I have reviewed the above documentation for accuracy and completeness, and I agree with the above. Jeanice Millard, M.D., Ph.D. 05/31/23 1:44 AM   Abbreviations: M myopia (nearsighted); A astigmatism; H hyperopia (farsighted); P presbyopia; Mrx spectacle prescription;  CTL contact lenses; OD right eye; OS left eye; OU both eyes  XT exotropia; ET esotropia; PEK punctate epithelial keratitis; PEE punctate epithelial erosions; DES dry eye syndrome; MGD meibomian gland dysfunction; ATs artificial tears; PFAT's preservative free artificial tears; NSC nuclear sclerotic cataract; PSC posterior subcapsular cataract; ERM epi-retinal membrane; PVD posterior vitreous detachment; RD retinal detachment; DM diabetes mellitus; DR diabetic retinopathy; NPDR non-proliferative diabetic retinopathy; PDR proliferative diabetic retinopathy; CSME clinically significant macular edema; DME diabetic macular edema; dbh dot blot hemorrhages; CWS cotton wool spot; POAG primary open angle glaucoma; C/D cup-to-disc ratio; HVF humphrey visual field; GVF goldmann visual field; OCT optical coherence tomography; IOP intraocular pressure; BRVO Branch retinal vein occlusion; CRVO central retinal vein occlusion; CRAO central retinal artery occlusion; BRAO branch retinal artery occlusion; RT retinal tear; SB scleral buckle; PPV pars plana vitrectomy; VH Vitreous hemorrhage; PRP panretinal laser photocoagulation; IVK intravitreal kenalog; VMT  vitreomacular traction; MH Macular hole;  NVD neovascularization of the disc; NVE neovascularization elsewhere; AREDS age related eye disease study; ARMD age related macular degeneration; POAG primary open angle glaucoma; EBMD epithelial/anterior basement membrane dystrophy; ACIOL anterior chamber intraocular lens; IOL intraocular lens; PCIOL posterior chamber intraocular lens; Phaco/IOL phacoemulsification with intraocular lens placement; PRK photorefractive keratectomy; LASIK laser assisted in situ keratomileusis; HTN hypertension; DM diabetes mellitus; COPD chronic obstructive pulmonary disease

## 2023-05-23 ENCOUNTER — Encounter (INDEPENDENT_AMBULATORY_CARE_PROVIDER_SITE_OTHER): Payer: Self-pay

## 2023-05-30 ENCOUNTER — Ambulatory Visit (INDEPENDENT_AMBULATORY_CARE_PROVIDER_SITE_OTHER): Admitting: Ophthalmology

## 2023-05-30 ENCOUNTER — Encounter (INDEPENDENT_AMBULATORY_CARE_PROVIDER_SITE_OTHER): Payer: Self-pay | Admitting: Ophthalmology

## 2023-05-30 DIAGNOSIS — H34831 Tributary (branch) retinal vein occlusion, right eye, with macular edema: Secondary | ICD-10-CM | POA: Diagnosis not present

## 2023-05-30 DIAGNOSIS — I10 Essential (primary) hypertension: Secondary | ICD-10-CM

## 2023-05-30 DIAGNOSIS — Z794 Long term (current) use of insulin: Secondary | ICD-10-CM | POA: Diagnosis not present

## 2023-05-30 DIAGNOSIS — Z7984 Long term (current) use of oral hypoglycemic drugs: Secondary | ICD-10-CM

## 2023-05-30 DIAGNOSIS — E113213 Type 2 diabetes mellitus with mild nonproliferative diabetic retinopathy with macular edema, bilateral: Secondary | ICD-10-CM | POA: Diagnosis not present

## 2023-05-30 DIAGNOSIS — H35033 Hypertensive retinopathy, bilateral: Secondary | ICD-10-CM

## 2023-05-30 DIAGNOSIS — Z961 Presence of intraocular lens: Secondary | ICD-10-CM

## 2023-05-30 DIAGNOSIS — H35371 Puckering of macula, right eye: Secondary | ICD-10-CM

## 2023-05-31 ENCOUNTER — Encounter (INDEPENDENT_AMBULATORY_CARE_PROVIDER_SITE_OTHER): Payer: Self-pay | Admitting: Ophthalmology

## 2023-05-31 MED ORDER — AFLIBERCEPT 8 MG/0.07ML IZ SOLN
8.0000 mg | INTRAVITREAL | Status: AC | PRN
Start: 2023-05-31 — End: 2023-05-31
  Administered 2023-05-31: 8 mg via INTRAVITREAL

## 2023-06-20 ENCOUNTER — Inpatient Hospital Stay: Payer: PRIVATE HEALTH INSURANCE

## 2023-06-20 ENCOUNTER — Inpatient Hospital Stay: Payer: PRIVATE HEALTH INSURANCE | Attending: Internal Medicine

## 2023-06-20 DIAGNOSIS — D509 Iron deficiency anemia, unspecified: Secondary | ICD-10-CM | POA: Diagnosis not present

## 2023-06-20 DIAGNOSIS — N183 Chronic kidney disease, stage 3 unspecified: Secondary | ICD-10-CM | POA: Diagnosis not present

## 2023-06-20 DIAGNOSIS — Z79899 Other long term (current) drug therapy: Secondary | ICD-10-CM | POA: Diagnosis not present

## 2023-06-20 DIAGNOSIS — E1122 Type 2 diabetes mellitus with diabetic chronic kidney disease: Secondary | ICD-10-CM | POA: Insufficient documentation

## 2023-06-20 DIAGNOSIS — D649 Anemia, unspecified: Secondary | ICD-10-CM

## 2023-06-20 DIAGNOSIS — I129 Hypertensive chronic kidney disease with stage 1 through stage 4 chronic kidney disease, or unspecified chronic kidney disease: Secondary | ICD-10-CM | POA: Diagnosis present

## 2023-06-20 LAB — HEMOGLOBIN AND HEMATOCRIT (CANCER CENTER ONLY)
HCT: 33.7 % — ABNORMAL LOW (ref 36.0–46.0)
Hemoglobin: 11.1 g/dL — ABNORMAL LOW (ref 12.0–15.0)

## 2023-06-20 NOTE — Progress Notes (Signed)
 Hgb 11.1 today, no retacrit  inj given

## 2023-06-26 NOTE — Progress Notes (Signed)
 [facility] - Clinic Note  06/28/2023     CHIEF COMPLAINT Patient presents for Retina Follow Up   HISTORY OF PRESENT ILLNESS: Cheryl Leonard is a 78 y.o. female who presents to the clinic today for:    HPI     Retina Follow Up   Patient presents with  CRVO/BRVO.  In right eye.  This started 4 weeks ago.  I, the attending physician,  performed the HPI with the patient and updated documentation appropriately.        Comments   Patient here for 4 weeks retina follow up for BRVO OD. Patient states vision doing same. No eye pain.       Last edited by Valdemar Rogue, MD on 06/28/2023 10:33 PM.    Pt states   Referring physician: Cleotilde Oneil FALCON, MD [phone] [facility] [address] [facility] [place],  [place] [address]   HISTORICAL INFORMATION:   Selected notes from the MEDICAL RECORD NUMBER Referred by Dr. Estelle for concern of DME OD Lab Results  Component Value Date   HGBA1C 7.3 (H) 04/07/2023     CURRENT MEDICATIONS: No current outpatient medications on file. (Ophthalmic Drugs)   No current facility-administered medications for this visit. (Ophthalmic Drugs)   Current Outpatient Medications (Other)  Medication Sig   ALPRAZolam  (XANAX ) 0.25 MG tablet Take 0.25 mg by mouth daily as needed for anxiety.   amLODipine  (NORVASC ) 5 MG tablet Take 5 mg by mouth 2 (two) times daily.   aspirin  EC 81 MG tablet Take 1 tablet (81 mg total) by mouth daily.   cholecalciferol  (VITAMIN D ) 1000 units tablet Take 1,000 Units by mouth 2 (two) times daily.   dapagliflozin propanediol (FARXIGA) 5 MG TABS tablet Take 5 mg by mouth daily.   denosumab  (PROLIA ) 60 MG/ML SOLN injection Inject 60 mg into the skin every 6 (six) months.    escitalopram  (LEXAPRO ) 10 MG tablet Take 10 mg by mouth daily.   estradiol  (ESTRACE ) 0.1 MG/GM vaginal cream Place 1 Applicatorful vaginally daily as needed (vaginal irritation).   famotidine  (PEPCID ) 40 MG tablet Take  40 mg by mouth daily.   furosemide  (LASIX ) 20 MG tablet Take 20 mg by mouth every Wednesday.   gabapentin  (NEURONTIN ) 300 MG capsule Take 300 mg by mouth at bedtime as needed (pain).   levothyroxine  (SYNTHROID , LEVOTHROID) 100 MCG tablet Take 100 mcg by mouth daily before breakfast.    lidocaine -prilocaine  (EMLA ) cream Apply 1 application topically as needed (apply prior to port a cath access).   Magnesium  500 MG TABS Take 500 mg by mouth every morning.    mirtazapine  (REMERON ) 15 MG tablet Take 15 mg by mouth as needed.   Multiple Vitamin (MULTIVITAMIN WITH MINERALS) TABS tablet Take 1 tablet by mouth daily. Centrum Silver   nystatin  cream (MYCOSTATIN ) Apply 1 application topically daily as needed (Yeast infection).   olmesartan (BENICAR) 20 MG tablet Take 20 mg by mouth daily.   pantoprazole  (PROTONIX ) 40 MG tablet Take 40 mg by mouth daily.   rivaroxaban  (XARELTO ) 20 MG TABS tablet Take 1 tablet (20 mg total) by mouth daily with supper.   rosuvastatin  (CRESTOR ) 20 MG tablet Take 20 mg by mouth every morning.   sodium bicarbonate  650 MG tablet Take 650 mg by mouth 2 (two) times daily.   TRESIBA FLEXTOUCH 200 UNIT/ML SOPN Inject 25-30 Units as directed at bedtime. Titrate according to fasting blood glucose not to exceed 50 units a day   vitamin  B-12 (CYANOCOBALAMIN ) 1000 MCG tablet Take 1,000 mcg by mouth daily.   vitamin C  (ASCORBIC ACID ) 250 MG tablet Take 250 mg by mouth daily.   vitamin E  400 UNIT capsule Take 400 Units by mouth daily.   zolpidem  (AMBIEN ) 10 MG tablet Take 10 mg by mouth at bedtime.   No current facility-administered medications for this visit. (Other)   REVIEW OF SYSTEMS: ROS   Positive for: Gastrointestinal, Musculoskeletal, Endocrine, Cardiovascular, Eyes, Respiratory Negative for: Constitutional, Neurological, Skin, Genitourinary, HENT, Psychiatric, Allergic/Imm, Heme/Lymph Last edited by Orval Asberry RAMAN, COA on 06/28/2023  2:16 PM.          ALLERGIES Allergies  Allergen Reactions   Ace Inhibitors Other (See Comments)    Unknown  Other Reaction(s): Intolerance    PAST MEDICAL HISTORY Past Medical History:  Diagnosis Date   Anemia    Anxiety    Arthritis    Gout   Cataracts, both eyes    Diabetic retinopathy (HCC)    NPDR OU   Diverticulitis    GERD (gastroesophageal reflux disease)    Gout    Headache    h/o migraines   History of fracture of patella    right knee   History of positive PPD    Patient always shows positive   Hyperlipidemia    Hypertension    Hypertensive retinopathy    OU   Hypothyroidism    Lichen sclerosus 12/30/2013   of vulva   Metatarsal fracture    Nausea and vomiting 04/11/2023   Neuropathy    Osteopenia    Peripheral vascular disease (HCC)    Polyneuropathy    numbness and tingling in feet and toes   Renal insufficiency    Stage 3   Sleep apnea    does not use cpap-lost weight    Type 2 diabetes mellitus, uncontrolled    Past Surgical History:  Procedure Laterality Date   ABDOMINAL HYSTERECTOMY     AMPUTATION TOE Right 05/08/2020   Procedure: AMPUTATION TOE-Right 4th Toe;  Surgeon: Lennie Barter, DPM;  Location: [facility];  Service: Podiatry;  Laterality: Right;   AMPUTATION TOE Left 04/22/2021   Procedure: AMPUTATION TOE - 4TH METARSOPHANGEAL JOINT;  Surgeon: Lennie Barter, DPM;  Location: [facility];  Service: Podiatry;  Laterality: Left;   APPENDECTOMY     BREAST REDUCTION SURGERY  2001   CATARACT EXTRACTION     CESAREAN SECTION  1976   COLONOSCOPY  03/05/2013   Nml - due for repeat 03/06/2018   COLONOSCOPY WITH PROPOFOL  N/A 03/18/2019   Procedure: COLONOSCOPY WITH PROPOFOL ;  Surgeon: Toledo, Ladell POUR, MD;  Location: [facility];  Service: Gastroenterology;  Laterality: N/A;   DIAGNOSTIC LAPAROSCOPY     DILATION AND CURETTAGE OF UTERUS  1989   ENDARTERECTOMY FEMORAL Bilateral 03/09/2018   Procedure: ENDARTERECTOMY FEMORAL;  Surgeon: Marea Selinda RAMAN, MD;   Location: [facility];  Service: Vascular;  Laterality: Bilateral;   ENDARTERECTOMY POPLITEAL Left 03/09/2018   Procedure: ENDARTERECTOMY POPLITEAL AND SFA;  Surgeon: Marea Selinda RAMAN, MD;  Location: [facility];  Service: Vascular;  Laterality: Left;   ESOPHAGOGASTRODUODENOSCOPY  03/05/2013   ESOPHAGOGASTRODUODENOSCOPY (EGD) WITH PROPOFOL  N/A 03/18/2019   Procedure: ESOPHAGOGASTRODUODENOSCOPY (EGD) WITH PROPOFOL ;  Surgeon: Toledo, Ladell POUR, MD;  Location: [facility];  Service: Gastroenterology;  Laterality: N/A;   EYE SURGERY     Eyelid Surgery  2012   INTRAMEDULLARY (IM) NAIL INTERTROCHANTERIC Left 10/30/2015   Procedure: INTRAMEDULLARY (IM) NAIL INTERTROCHANTRIC ;  Surgeon: Ozell Flake, MD;  Location: [facility];  Service: Orthopedics;  Laterality: Left;   KYPHOPLASTY N/A 10/25/2018   Procedure: L4 KYPHOPLASTY;  Surgeon: Kathlynn Sharper, MD;  Location: [facility];  Service: Orthopedics;  Laterality: N/A;   LAPAROSCOPIC HYSTERECTOMY  2000   total   LOWER EXTREMITY ANGIOGRAPHY Left 03/08/2017   Procedure: LOWER EXTREMITY ANGIOGRAPHY;  Surgeon: Marea Selinda RAMAN, MD;  Location: [facility];  Service: Cardiovascular;  Laterality: Left;   LOWER EXTREMITY ANGIOGRAPHY Left 10/30/2017   Procedure: LOWER EXTREMITY ANGIOGRAPHY;  Surgeon: Marea Selinda RAMAN, MD;  Location: [facility];  Service: Cardiovascular;  Laterality: Left;   LOWER EXTREMITY ANGIOGRAPHY Right 03/08/2018   Procedure: LOWER EXTREMITY ANGIOGRAPHY;  Surgeon: Marea Selinda RAMAN, MD;  Location: [facility];  Service: Cardiovascular;  Laterality: Right;   LOWER EXTREMITY ANGIOGRAPHY Left 10/01/2018   Procedure: LOWER EXTREMITY ANGIOGRAPHY;  Surgeon: Marea Selinda RAMAN, MD;  Location: [facility];  Service: Cardiovascular;  Laterality: Left;   LOWER EXTREMITY ANGIOGRAPHY Right 10/08/2018   Procedure: LOWER EXTREMITY ANGIOGRAPHY;  Surgeon: Marea Selinda RAMAN, MD;  Location: [facility];  Service: Cardiovascular;  Laterality: Right;   LOWER  EXTREMITY ANGIOGRAPHY Right 05/07/2020   Procedure: Lower Extremity Angiography;  Surgeon: Marea Selinda RAMAN, MD;  Location: [facility];  Service: Cardiovascular;  Laterality: Right;   LYSIS OF ADHESION  01/25/2021   Procedure: LYSIS OF ADHESION;  Surgeon: Lane Shope, MD;  Location: [facility];  Service: General;;   PERIPHERAL VASCULAR INTERVENTION  03/08/2018   Procedure: PERIPHERAL VASCULAR INTERVENTION;  Surgeon: Marea Selinda RAMAN, MD;  Location: [facility];  Service: Cardiovascular;;   PORTA CATH INSERTION N/A 02/17/2020   Procedure: PORTA CATH INSERTION;  Surgeon: Marea Selinda RAMAN, MD;  Location: [facility];  Service: Cardiovascular;  Laterality: N/A;   REDUCTION MAMMAPLASTY  1997   SACROPLASTY N/A 10/25/2018   Procedure: S1 SACROPLASTY;  Surgeon: Kathlynn Sharper, MD;  Location: [facility];  Service: Orthopedics;  Laterality: N/A;   FAMILY HISTORY Family History  Problem Relation Age of Onset   Coronary artery disease Father    Heart attack Father    Coronary artery disease Mother    Heart attack Mother    Ovarian cancer Sister 42       sister had hormonal therapy for IVF txs-which increased risk factor for ovarian cancer   Breast cancer Neg Hx    SOCIAL HISTORY Social History   Tobacco Use   Smoking status: Former    Current packs/day: 0.00    Average packs/day: 1 pack/day for 20.0 years (20.0 ttl pk-yrs)    Types: Cigarettes    Start date: 03/07/1976    Quit date: 03/07/1996    Years since quitting: 27.3    Passive exposure: Past   Smokeless tobacco: Never   Tobacco comments:    started smoking at age 64 but stopped smoking in 2000  Vaping Use   Vaping status: Never Used  Substance Use Topics   Alcohol use: No    Alcohol/week: 0.0 standard drinks of alcohol   Drug use: No       OPHTHALMIC EXAM: Base Eye Exam     Visual Acuity (Snellen - Linear)       Right Left   Dist Spray 20/40 -2 20/20         Tonometry (Tonopen, 2:14 PM)       Right Left    Pressure 14 14  Pupils       Dark Light Shape React APD   Right 3 2 Round Brisk None   Left 3 2 Round Brisk None         Visual Fields (Counting fingers)       Left Right    Full Full         Extraocular Movement       Right Left    Full, Ortho Full, Ortho         Neuro/Psych     Oriented x3: Yes   Mood/Affect: Normal         Dilation     Both eyes: 1.0% Mydriacyl, 2.5% Phenylephrine  @ 2:12 PM           Slit Lamp and Fundus Exam     External Exam       Right Left   External Normal Normal         Slit Lamp Exam       Right Left   Lids/Lashes dermatochalasis dermatochalasis   Conjunctiva/Sclera White and quiet Trace Injection   Cornea arcus; well healed cataract wound; trace Punctate epithelial erosions arcus; well healed cataract wound, trace PEE   Anterior Chamber Deep and quiet Deep and quiet   Iris Round and dilated Round and dilated   Lens PCIOL; open PC PCIOL; open PC   Anterior Vitreous syneresis, Posterior vitreous detachment, vitreous condensations inferiorly syneresis, Posterior vitreous detachment         Fundus Exam       Right Left   Disc Superior hyperemia -- stably improved, mild Pallor, vascular loops Pink and Sharp   C/D Ratio 0.6 0.5   Macula Flat, Blunted foveal reflex, +cystic changes centrally -- increased, +Epiretinal membrane, minimal MA flat; good foveal reflex, no heme or edema, small pigment clump IT to fovea   Vessels attenuated, Tortuous attenuated, Tortuous   Periphery Attached; scattered DBH -- greastest temporal periphery- improved Attached, no heme           IMAGING AND PROCEDURES  Imaging and Procedures for 04/25/17  OCT, Retina - OU - Both Eyes       Right Eye Quality was borderline. Central Foveal Thickness: 413. Progression has worsened. Findings include no SRF, abnormal foveal contour, epiretinal membrane, intraretinal fluid (Interval increase in central IRF / cystic changes ).    Left Eye Quality was good. Central Foveal Thickness: 297. Progression has been stable. Findings include normal foveal contour, no IRF, no SRF.   Notes *Images captured and stored on drive  Diagnosis / Impression:  OD: Interval increase in central IRF / cystic changes  OS: NFP; no IRF/SRF--stable  Clinical management:  See below  Abbreviations: NFP - Normal foveal profile. CME - cystoid macular edema. PED - pigment epithelial detachment. IRF - intraretinal fluid. SRF - subretinal fluid. EZ - ellipsoid zone. ERM - epiretinal membrane. ORA - outer retinal atrophy. ORT - outer retinal tubulation. SRHM - subretinal hyper-reflective material      Intravitreal Injection, Pharmacologic Agent - OD - Right Eye       Time Out 06/28/2023. 3:19 PM. Confirmed correct patient, procedure, site, and patient consented.   Anesthesia Topical anesthesia was used. Anesthetic medications included Lidocaine  2%, Proparacaine 0.5%.   Procedure Preparation included 5% betadine to ocular surface, eyelid speculum. A (30 g) needle was used.   Injection: 6 mg faricimab -svoa 6 MG/0.05ML   Route: Intravitreal, Site: Right Eye   NDC: 49757-903-93, Lot: [id number], Expiration date: 06/02/2024, Waste:  0 mL   Post-op Post injection exam found visual acuity of at least counting fingers. The patient tolerated the procedure well. There were no complications. The patient received written and verbal post procedure care education. Post injection medications were not given.            ASSESSMENT/PLAN:    ICD-10-CM   1. Branch retinal vein occlusion of right eye with macular edema  H34.8310 OCT, Retina - OU - Both Eyes    Intravitreal Injection, Pharmacologic Agent - OD - Right Eye    faricimab -svoa (VABYSMO ) 6mg /0.46mL intravitreal injection    2. Both eyes affected by mild nonproliferative diabetic retinopathy with macular edema, associated with type 2 diabetes mellitus (HCC)  Z88.6786     3. Long term  (current) use of oral hypoglycemic drugs  Z79.84     4. Current use of insulin  (HCC)  Z79.4     5. Essential hypertension  I10     6. Hypertensive retinopathy of both eyes  H35.033     7. Epiretinal membrane (ERM) of right eye  H35.371     8. Pseudophakia of both eyes  Z96.1      1. BRVO w/ CME OD - by history, pt states symptoms first noticed 2 wks prior to presentation, but reports changes may have occurred prior  - initial exam with differential tortuosity of vessels (OD > OS) - FA (02.10.20) shows mild late staining / leakage in macula, staining / leakage of disc -- improving CME - differential includes DM2 (DME), hypertensive retinopathy, inflammatory etiology / uveitis - S/P IVA OD #1 (02.08.19), #2 (03.11.19), #3 (04.09.19), #4 (05.20.19), #5 (02.10.20) - gave IVA OD on 2.10.20 due to pending Eylea4U for 2020 -- resulted in increased IRF/CME - review of OCT show persistent IRF and cystic changes --  resistance to IVA  - June 2019 -- switched therapies:  ============================== - S/P IVE OD #1 (06.24.19), #2 (07.24.19), #3 (09.04.19), #4 (10.30.19),#5 (12.30.19), #6 (03.23.20), #7 (05.05.20), #8 (07.16.20), #9 (07.17.20), #10 (08.28.20), #11 (10.13.20), # 12 (11.17.20), #13 (2.8.21), #14 (03.09.21), #15 (04.13.21), #16 (05.11.21), #17 (06.17.21), #18 (07.23.21), #19 (08.30.21), #20 (10.04.21), #21 (11.08.21), #22 (12.08.21), #23 (01.31.22), #24 (02.28.22), #25 (04.01.22), #26 (06.15.22), #27 (07.13.22), #28 (08.17.22), #29 (09.21.22), #30 (10.19.22), #31 (11.16.22), #32 (12.16.22), #33 (01.13.23), #34 (02.10.23), #35 (03.15.23), #36 (04.14.23), #37 (05.15.23), #38 (06.12.23), #39 (07.10.23), #40 (08.07.23), #41 (09.06.23), #42 (10.04.23), #43 (11.01.23) -- IVE resistance ================================ **interval increase in IRF at 5 weeks on 07.01.24 and at 6 wks on 02.28.25 (IVV)** - s/p IVV OD #1 (12.04.23), #2 (01.03.24), #3 (01.31.24), #4 (02.28.24), #5 (03.29.24), #6  (04.24.24), #7 (05.28.24), #8 (07.01.24), #9 (07.29.24), #10 (08.26.24), #11 (09.26.24), #12 (10.25.24), #13 (11.25.24), #14 (01.07.24), #15 (02.18.25)  - s/p IVE HD OD #1 (03.25.25), #2 (04.29.25), #3 (05.27.25) - OCT today shows interval increase in central IRF / cystic changes at 4 weeks  - BCVA OD decreased to 20/40 from 20/30  - recommend IVV OD #16 today, 06.25.25 w/ f/u in 4 weeks  - RBA of procedure discussed, questions answered  - IVV informed consent obtained and signed, 06.25.25 (OD)  - see procedure note  - f/u in 4 weeks  -- DFE/OCT/possible injection  2-4. Mild nonproliferative diabetic retinopathy, both eyes  - s/p IVE HD #1 (03.25.25), #2 (04.29.25), #3 (05.27.25) - A1c 7.3 on 04.04.25  - likely DME component contributing to CME OD  - OD shows interval increase in central IRF   - OS with minimal diabetic  retinopathy  - Eylea  HD informed consent form obtained, signed and scanned on 03.25.25  - pt approved for Eylea  HD through Medicare and supplement  - monitor OS  - f/u 4 weeks, DFE, OCT  5,6. Hypertensive retinopathy OU - stable  - as above, may have contributing to CME OD  - discussed importance of tight BP control  - monitor  7. Epiretinal membrane, right eye   - stable nasal ERM  - no indication for surgery at this time  8. Pseudophakia OU  - s/p CE/IOL OU by cataract surgeon in [facility]  - doing well  - monitor  Ophthalmic Meds Ordered this visit:  Meds ordered this encounter  Medications   faricimab -svoa (VABYSMO ) 6mg /0.75mL intravitreal injection     Return in about 4 weeks (around 07/26/2023) for RVO + DME OD, Dilated Exam, OCT, Possible Injxn.   This document serves as a record of services personally performed by Redell JUDITHANN Hans, MD, PhD. It was created on their behalf by Almetta Pesa, an ophthalmic technician. The creation of this record is the provider's dictation and/or activities during the visit.    Electronically signed by: Almetta Pesa,  OA, 06/28/23  10:35 PM  This document serves as a record of services personally performed by Redell JUDITHANN Hans, MD, PhD. It was created on their behalf by Alan PARAS. Delores, OA an ophthalmic technician. The creation of this record is the provider's dictation and/or activities during the visit.    Electronically signed by: Alan PARAS. Delores, OA 06/28/23 10:35 PM  Redell JUDITHANN Hans, M.D., Ph.D. Diseases & Surgery of the Retina and Vitreous [facility]  I have reviewed the above documentation for accuracy and completeness, and I agree with the above. Redell JUDITHANN Hans, M.D., Ph.D. 06/28/23 10:41 PM   Abbreviations: M myopia (nearsighted); A astigmatism; H hyperopia (farsighted); P presbyopia; Mrx spectacle prescription;  CTL contact lenses; OD right eye; OS left eye; OU both eyes  XT exotropia; ET esotropia; PEK punctate epithelial keratitis; PEE punctate epithelial erosions; DES dry eye syndrome; MGD meibomian gland dysfunction; ATs artificial tears; PFAT's preservative free artificial tears; NSC nuclear sclerotic cataract; PSC posterior subcapsular cataract; ERM epi-retinal membrane; PVD posterior vitreous detachment; RD retinal detachment; DM diabetes mellitus; DR diabetic retinopathy; NPDR non-proliferative diabetic retinopathy; PDR proliferative diabetic retinopathy; CSME clinically significant macular edema; DME diabetic macular edema; dbh dot blot hemorrhages; CWS cotton wool spot; POAG primary open angle glaucoma; C/D cup-to-disc ratio; HVF humphrey visual field; GVF goldmann visual field; OCT optical coherence tomography; IOP intraocular pressure; BRVO Branch retinal vein occlusion; CRVO central retinal vein occlusion; CRAO central retinal artery occlusion; BRAO branch retinal artery occlusion; RT retinal tear; SB scleral buckle; PPV pars plana vitrectomy; VH Vitreous hemorrhage; PRP panretinal laser photocoagulation; IVK intravitreal kenalog; VMT vitreomacular traction; MH Macular  hole;  NVD neovascularization of the disc; NVE neovascularization elsewhere; AREDS age related eye disease study; ARMD age related macular degeneration; POAG primary open angle glaucoma; EBMD epithelial/anterior basement membrane dystrophy; ACIOL anterior chamber intraocular lens; IOL intraocular lens; PCIOL posterior chamber intraocular lens; Phaco/IOL phacoemulsification with intraocular lens placement; PRK photorefractive keratectomy; LASIK laser assisted in situ keratomileusis; HTN hypertension; DM diabetes mellitus; COPD chronic obstructive pulmonary disease

## 2023-06-28 ENCOUNTER — Ambulatory Visit (INDEPENDENT_AMBULATORY_CARE_PROVIDER_SITE_OTHER): Admitting: Ophthalmology

## 2023-06-28 ENCOUNTER — Encounter (INDEPENDENT_AMBULATORY_CARE_PROVIDER_SITE_OTHER): Payer: Self-pay | Admitting: Ophthalmology

## 2023-06-28 DIAGNOSIS — Z7984 Long term (current) use of oral hypoglycemic drugs: Secondary | ICD-10-CM

## 2023-06-28 DIAGNOSIS — H35371 Puckering of macula, right eye: Secondary | ICD-10-CM

## 2023-06-28 DIAGNOSIS — E113213 Type 2 diabetes mellitus with mild nonproliferative diabetic retinopathy with macular edema, bilateral: Secondary | ICD-10-CM | POA: Diagnosis not present

## 2023-06-28 DIAGNOSIS — Z794 Long term (current) use of insulin: Secondary | ICD-10-CM | POA: Diagnosis not present

## 2023-06-28 DIAGNOSIS — H35033 Hypertensive retinopathy, bilateral: Secondary | ICD-10-CM

## 2023-06-28 DIAGNOSIS — H34831 Tributary (branch) retinal vein occlusion, right eye, with macular edema: Secondary | ICD-10-CM | POA: Diagnosis not present

## 2023-06-28 DIAGNOSIS — Z961 Presence of intraocular lens: Secondary | ICD-10-CM

## 2023-06-28 DIAGNOSIS — I10 Essential (primary) hypertension: Secondary | ICD-10-CM

## 2023-06-28 MED ORDER — FARICIMAB-SVOA 6 MG/0.05ML IZ SOSY
6.0000 mg | PREFILLED_SYRINGE | INTRAVITREAL | Status: AC | PRN
  Administered 2023-06-28: 6 mg via INTRAVITREAL

## 2023-07-20 NOTE — Progress Notes (Shared)
 [facility] - Clinic Note  07/26/2023     CHIEF COMPLAINT Patient presents for No chief complaint on file.   HISTORY OF PRESENT ILLNESS: Cheryl Leonard is a 78 y.o. female who presents to the clinic today for:     Pt states   Referring physician: Cleotilde Oneil FALCON, MD [address] [facility] [place],  [place] [address]   HISTORICAL INFORMATION:   Selected notes from the MEDICAL RECORD NUMBER Referred by Dr. Estelle for concern of DME OD Lab Results  Component Value Date   HGBA1C 7.3 (H) 04/07/2023     CURRENT MEDICATIONS: No current outpatient medications on file. (Ophthalmic Drugs)   No current facility-administered medications for this visit. (Ophthalmic Drugs)   Current Outpatient Medications (Other)  Medication Sig   ALPRAZolam  (XANAX ) 0.25 MG tablet Take 0.25 mg by mouth daily as needed for anxiety.   amLODipine  (NORVASC ) 5 MG tablet Take 5 mg by mouth 2 (two) times daily.   aspirin  EC 81 MG tablet Take 1 tablet (81 mg total) by mouth daily.   cholecalciferol  (VITAMIN D ) 1000 units tablet Take 1,000 Units by mouth 2 (two) times daily.   dapagliflozin propanediol (FARXIGA) 5 MG TABS tablet Take 5 mg by mouth daily.   denosumab  (PROLIA ) 60 MG/ML SOLN injection Inject 60 mg into the skin every 6 (six) months.    escitalopram  (LEXAPRO ) 10 MG tablet Take 10 mg by mouth daily.   estradiol  (ESTRACE ) 0.1 MG/GM vaginal cream Place 1 Applicatorful vaginally daily as needed (vaginal irritation).   famotidine  (PEPCID ) 40 MG tablet Take 40 mg by mouth daily.   furosemide  (LASIX ) 20 MG tablet Take 20 mg by mouth every Wednesday.   gabapentin  (NEURONTIN ) 300 MG capsule Take 300 mg by mouth at bedtime as needed (pain).   levothyroxine  (SYNTHROID , LEVOTHROID) 100 MCG tablet Take 100 mcg by mouth daily before breakfast.    lidocaine -prilocaine  (EMLA ) cream Apply 1 application topically as needed (apply prior to port a cath  access).   Magnesium  500 MG TABS Take 500 mg by mouth every morning.    mirtazapine  (REMERON ) 15 MG tablet Take 15 mg by mouth as needed.   Multiple Vitamin (MULTIVITAMIN WITH MINERALS) TABS tablet Take 1 tablet by mouth daily. Centrum Silver   nystatin  cream (MYCOSTATIN ) Apply 1 application topically daily as needed (Yeast infection).   olmesartan (BENICAR) 20 MG tablet Take 20 mg by mouth daily.   pantoprazole  (PROTONIX ) 40 MG tablet Take 40 mg by mouth daily.   rivaroxaban  (XARELTO ) 20 MG TABS tablet Take 1 tablet (20 mg total) by mouth daily with supper.   rosuvastatin  (CRESTOR ) 20 MG tablet Take 20 mg by mouth every morning.   sodium bicarbonate  650 MG tablet Take 650 mg by mouth 2 (two) times daily.   TRESIBA FLEXTOUCH 200 UNIT/ML SOPN Inject 25-30 Units as directed at bedtime. Titrate according to fasting blood glucose not to exceed 50 units a day   vitamin B-12 (CYANOCOBALAMIN ) 1000 MCG tablet Take 1,000 mcg by mouth daily.   vitamin C  (ASCORBIC ACID ) 250 MG tablet Take 250 mg by mouth daily.   vitamin E  400 UNIT capsule Take 400 Units by mouth daily.   zolpidem  (AMBIEN ) 10 MG tablet Take 10 mg by mouth at bedtime.   No current facility-administered medications for this visit. (Other)   REVIEW OF SYSTEMS:       ALLERGIES Allergies  Allergen Reactions   Ace Inhibitors Other (See Comments)  Unknown  Other Reaction(s): Intolerance    PAST MEDICAL HISTORY Past Medical History:  Diagnosis Date   Anemia    Anxiety    Arthritis    Gout   Cataracts, both eyes    Diabetic retinopathy (HCC)    NPDR OU   Diverticulitis    GERD (gastroesophageal reflux disease)    Gout    Headache    h/o migraines   History of fracture of patella    right knee   History of positive PPD    Patient always shows positive   Hyperlipidemia    Hypertension    Hypertensive retinopathy    OU   Hypothyroidism    Lichen sclerosus 12/30/2013   of vulva   Metatarsal fracture    Nausea  and vomiting 04/11/2023   Neuropathy    Osteopenia    Peripheral vascular disease (HCC)    Polyneuropathy    numbness and tingling in feet and toes   Renal insufficiency    Stage 3   Sleep apnea    does not use cpap-lost weight    Type 2 diabetes mellitus, uncontrolled    Past Surgical History:  Procedure Laterality Date   ABDOMINAL HYSTERECTOMY     AMPUTATION TOE Right 05/08/2020   Procedure: AMPUTATION TOE-Right 4th Toe;  Surgeon: Lennie Barter, DPM;  Location: [facility];  Service: Podiatry;  Laterality: Right;   AMPUTATION TOE Left 04/22/2021   Procedure: AMPUTATION TOE - 4TH METARSOPHANGEAL JOINT;  Surgeon: Lennie Barter, DPM;  Location: [facility];  Service: Podiatry;  Laterality: Left;   APPENDECTOMY     BREAST REDUCTION SURGERY  2001   CATARACT EXTRACTION     CESAREAN SECTION  1976   COLONOSCOPY  03/05/2013   Nml - due for repeat 03/06/2018   COLONOSCOPY WITH PROPOFOL  N/A 03/18/2019   Procedure: COLONOSCOPY WITH PROPOFOL ;  Surgeon: Toledo, Ladell POUR, MD;  Location: [facility];  Service: Gastroenterology;  Laterality: N/A;   DIAGNOSTIC LAPAROSCOPY     DILATION AND CURETTAGE OF UTERUS  1989   ENDARTERECTOMY FEMORAL Bilateral 03/09/2018   Procedure: ENDARTERECTOMY FEMORAL;  Surgeon: Marea Selinda RAMAN, MD;  Location: [facility];  Service: Vascular;  Laterality: Bilateral;   ENDARTERECTOMY POPLITEAL Left 03/09/2018   Procedure: ENDARTERECTOMY POPLITEAL AND SFA;  Surgeon: Marea Selinda RAMAN, MD;  Location: [facility];  Service: Vascular;  Laterality: Left;   ESOPHAGOGASTRODUODENOSCOPY  03/05/2013   ESOPHAGOGASTRODUODENOSCOPY (EGD) WITH PROPOFOL  N/A 03/18/2019   Procedure: ESOPHAGOGASTRODUODENOSCOPY (EGD) WITH PROPOFOL ;  Surgeon: Toledo, Ladell POUR, MD;  Location: [facility];  Service: Gastroenterology;  Laterality: N/A;   EYE SURGERY     Eyelid Surgery  2012   INTRAMEDULLARY (IM) NAIL INTERTROCHANTERIC Left 10/30/2015   Procedure: INTRAMEDULLARY (IM) NAIL INTERTROCHANTRIC ;  Surgeon: Ozell Flake, MD;   Location: [facility];  Service: Orthopedics;  Laterality: Left;   KYPHOPLASTY N/A 10/25/2018   Procedure: L4 KYPHOPLASTY;  Surgeon: Flake Ozell, MD;  Location: [facility];  Service: Orthopedics;  Laterality: N/A;   LAPAROSCOPIC HYSTERECTOMY  2000   total   LOWER EXTREMITY ANGIOGRAPHY Left 03/08/2017   Procedure: LOWER EXTREMITY ANGIOGRAPHY;  Surgeon: Marea Selinda RAMAN, MD;  Location: [facility];  Service: Cardiovascular;  Laterality: Left;   LOWER EXTREMITY ANGIOGRAPHY Left 10/30/2017   Procedure: LOWER EXTREMITY ANGIOGRAPHY;  Surgeon: Marea Selinda RAMAN, MD;  Location: [facility];  Service: Cardiovascular;  Laterality: Left;   LOWER EXTREMITY ANGIOGRAPHY Right 03/08/2018   Procedure: LOWER EXTREMITY ANGIOGRAPHY;  Surgeon: Marea Selinda RAMAN, MD;  Location: [facility];  Service: Cardiovascular;  Laterality: Right;   LOWER EXTREMITY ANGIOGRAPHY Left 10/01/2018   Procedure: LOWER EXTREMITY ANGIOGRAPHY;  Surgeon: Marea Selinda RAMAN, MD;  Location: [facility];  Service: Cardiovascular;  Laterality: Left;   LOWER EXTREMITY ANGIOGRAPHY Right 10/08/2018   Procedure: LOWER EXTREMITY ANGIOGRAPHY;  Surgeon: Marea Selinda RAMAN, MD;  Location: [facility];  Service: Cardiovascular;  Laterality: Right;   LOWER EXTREMITY ANGIOGRAPHY Right 05/07/2020   Procedure: Lower Extremity Angiography;  Surgeon: Marea Selinda RAMAN, MD;  Location: [facility];  Service: Cardiovascular;  Laterality: Right;   LYSIS OF ADHESION  01/25/2021   Procedure: LYSIS OF ADHESION;  Surgeon: Lane Shope, MD;  Location: [facility];  Service: General;;   PERIPHERAL VASCULAR INTERVENTION  03/08/2018   Procedure: PERIPHERAL VASCULAR INTERVENTION;  Surgeon: Marea Selinda RAMAN, MD;  Location: [facility];  Service: Cardiovascular;;   PORTA CATH INSERTION N/A 02/17/2020   Procedure: PORTA CATH INSERTION;  Surgeon: Marea Selinda RAMAN, MD;  Location: [facility];  Service: Cardiovascular;  Laterality: N/A;   REDUCTION  MAMMAPLASTY  1997   SACROPLASTY N/A 10/25/2018   Procedure: S1 SACROPLASTY;  Surgeon: Kathlynn Sharper, MD;  Location: [facility];  Service: Orthopedics;  Laterality: N/A;   FAMILY HISTORY Family History  Problem Relation Age of Onset   Coronary artery disease Father    Heart attack Father    Coronary artery disease Mother    Heart attack Mother    Ovarian cancer Sister 8       sister had hormonal therapy for IVF txs-which increased risk factor for ovarian cancer   Breast cancer Neg Hx    SOCIAL HISTORY Social History   Tobacco Use   Smoking status: Former    Current packs/day: 0.00    Average packs/day: 1 pack/day for 20.0 years (20.0 ttl pk-yrs)    Types: Cigarettes    Start date: 03/07/1976    Quit date: 03/07/1996    Years since quitting: 27.3    Passive exposure: Past   Smokeless tobacco: Never   Tobacco comments:    started smoking at age 46 but stopped smoking in 2000  Vaping Use   Vaping status: Never Used  Substance Use Topics   Alcohol use: No    Alcohol/week: 0.0 standard drinks of alcohol   Drug use: No       OPHTHALMIC EXAM: Not recorded    IMAGING AND PROCEDURES  Imaging and Procedures for 04/25/17          ASSESSMENT/PLAN:  No diagnosis found.  1. BRVO w/ CME OD - by history, pt states symptoms first noticed 2 wks prior to presentation, but reports changes may have occurred prior  - initial exam with differential tortuosity of vessels (OD > OS) - FA (02.10.20) shows mild late staining / leakage in macula, staining / leakage of disc -- improving CME - differential includes DM2 (DME), hypertensive retinopathy, inflammatory etiology / uveitis - S/P IVA OD #1 (02.08.19), #2 (03.11.19), #3 (04.09.19), #4 (05.20.19), #5 (02.10.20) - gave IVA OD on 2.10.20 due to pending Eylea4U for 2020 -- resulted in increased IRF/CME - review of OCT show persistent IRF and cystic changes --  resistance to IVA  - June 2019 -- switched therapies:   ============================== - S/P IVE OD #1 (06.24.19), #2 (07.24.19), #3 (09.04.19), #4 (10.30.19),#5 (12.30.19), #6 (03.23.20), #7 (05.05.20), #8 (07.16.20), #9 (07.17.20), #10 (08.28.20), #11 (10.13.20), # 12 (11.17.20), #13 (2.8.21), #14 (03.09.21), #  15 (04.13.21), #16 (05.11.21), #17 (06.17.21), #18 (07.23.21), #19 (08.30.21), #20 (10.04.21), #21 (11.08.21), #22 (12.08.21), #23 (01.31.22), #24 (02.28.22), #25 (04.01.22), #26 (06.15.22), #27 (07.13.22), #28 (08.17.22), #29 (09.21.22), #30 (10.19.22), #31 (11.16.22), #32 (12.16.22), #33 (01.13.23), #34 (02.10.23), #35 (03.15.23), #36 (04.14.23), #37 (05.15.23), #38 (06.12.23), #39 (07.10.23), #40 (08.07.23), #41 (09.06.23), #42 (10.04.23), #43 (11.01.23) -- IVE resistance ================================ **interval increase in IRF at 5 weeks on 07.01.24 and at 6 wks on 02.28.25 (IVV)** - s/p IVV OD #1 (12.04.23), #2 (01.03.24), #3 (01.31.24), #4 (02.28.24), #5 (03.29.24), #6 (04.24.24), #7 (05.28.24), #8 (07.01.24), #9 (07.29.24), #10 (08.26.24), #11 (09.26.24), #12 (10.25.24), #13 (11.25.24), #14 (01.07.24), #15 (02.18.25), #16 (06.25.25) - s/p IVE HD OD #1 (03.25.25), #2 (04.29.25), #3 (05.27.25) - OCT today shows interval increase in central IRF / cystic changes at 4 weeks  - BCVA OD decreased to 20/40 from 20/30  - recommend IVV OD #17 today, 07.23.25 w/ f/u in 4 weeks  - RBA of procedure discussed, questions answered  - IVV informed consent obtained and signed, 06.25.25 (OD)  - see procedure note  - f/u in 4 weeks  -- DFE/OCT/possible injection  2-4. Mild nonproliferative diabetic retinopathy, both eyes  - s/p IVE HD #1 (03.25.25), #2 (04.29.25), #3 (05.27.25) - A1c 7.3 on 04.04.25  - likely DME component contributing to CME OD  - OD shows interval increase in central IRF   - OS with minimal diabetic retinopathy  - Eylea  HD informed consent form obtained, signed and scanned on 03.25.25  - pt approved for Eylea  HD through Medicare  and supplement  - monitor OS  - f/u 4 weeks, DFE, OCT  5,6. Hypertensive retinopathy OU - stable  - as above, may have contributing to CME OD  - discussed importance of tight BP control  - monitor  7. Epiretinal membrane, right eye   - stable nasal ERM  - no indication for surgery at this time  8. Pseudophakia OU  - s/p CE/IOL OU by cataract surgeon in [facility]  - doing well  - monitor  Ophthalmic Meds Ordered this visit:  No orders of the defined types were placed in this encounter.    No follow-ups on file.   This document serves as a record of services personally performed by Redell JUDITHANN Hans, MD, PhD. It was created on their behalf by Almetta Pesa, an ophthalmic technician. The creation of this record is the provider's dictation and/or activities during the visit.    Electronically signed by: Almetta Pesa, OA, 07/20/23  2:25 PM    Redell JUDITHANN Hans, M.D., Ph.D. Diseases & Surgery of the Retina and Vitreous [facility]   Abbreviations: M myopia (nearsighted); A astigmatism; H hyperopia (farsighted); P presbyopia; Mrx spectacle prescription;  CTL contact lenses; OD right eye; OS left eye; OU both eyes  XT exotropia; ET esotropia; PEK punctate epithelial keratitis; PEE punctate epithelial erosions; DES dry eye syndrome; MGD meibomian gland dysfunction; ATs artificial tears; PFAT's preservative free artificial tears; NSC nuclear sclerotic cataract; PSC posterior subcapsular cataract; ERM epi-retinal membrane; PVD posterior vitreous detachment; RD retinal detachment; DM diabetes mellitus; DR diabetic retinopathy; NPDR non-proliferative diabetic retinopathy; PDR proliferative diabetic retinopathy; CSME clinically significant macular edema; DME diabetic macular edema; dbh dot blot hemorrhages; CWS cotton wool spot; POAG primary open angle glaucoma; C/D cup-to-disc ratio; HVF humphrey visual field; GVF goldmann visual field; OCT optical coherence tomography; IOP  intraocular pressure; BRVO Branch retinal vein occlusion; CRVO central retinal vein occlusion; CRAO central retinal artery  occlusion; BRAO branch retinal artery occlusion; RT retinal tear; SB scleral buckle; PPV pars plana vitrectomy; VH Vitreous hemorrhage; PRP panretinal laser photocoagulation; IVK intravitreal kenalog; VMT vitreomacular traction; MH Macular hole;  NVD neovascularization of the disc; NVE neovascularization elsewhere; AREDS age related eye disease study; ARMD age related macular degeneration; POAG primary open angle glaucoma; EBMD epithelial/anterior basement membrane dystrophy; ACIOL anterior chamber intraocular lens; IOL intraocular lens; PCIOL posterior chamber intraocular lens; Phaco/IOL phacoemulsification with intraocular lens placement; PRK photorefractive keratectomy; LASIK laser assisted in situ keratomileusis; HTN hypertension; DM diabetes mellitus; COPD chronic obstructive pulmonary disease

## 2023-07-21 ENCOUNTER — Other Ambulatory Visit: Payer: Self-pay | Admitting: Physician Assistant

## 2023-07-21 DIAGNOSIS — W19XXXA Unspecified fall, initial encounter: Secondary | ICD-10-CM

## 2023-07-21 DIAGNOSIS — Z8781 Personal history of (healed) traumatic fracture: Secondary | ICD-10-CM

## 2023-07-21 DIAGNOSIS — M545 Low back pain, unspecified: Secondary | ICD-10-CM

## 2023-07-25 ENCOUNTER — Other Ambulatory Visit: Payer: Self-pay | Admitting: Physician Assistant

## 2023-07-25 ENCOUNTER — Ambulatory Visit
Admission: RE | Admit: 2023-07-25 | Discharge: 2023-07-25 | Disposition: A | Discharge status: Home / Self Care | Source: Ambulatory Visit | Attending: Physician Assistant | Admitting: Physician Assistant

## 2023-07-25 DIAGNOSIS — M7989 Other specified soft tissue disorders: Secondary | ICD-10-CM

## 2023-07-26 ENCOUNTER — Encounter (INDEPENDENT_AMBULATORY_CARE_PROVIDER_SITE_OTHER): Payer: Self-pay | Admitting: Ophthalmology

## 2023-07-26 ENCOUNTER — Ambulatory Visit (INDEPENDENT_AMBULATORY_CARE_PROVIDER_SITE_OTHER): Admitting: Ophthalmology

## 2023-07-26 ENCOUNTER — Encounter (INDEPENDENT_AMBULATORY_CARE_PROVIDER_SITE_OTHER): Admitting: Ophthalmology

## 2023-07-26 DIAGNOSIS — Z961 Presence of intraocular lens: Secondary | ICD-10-CM

## 2023-07-26 DIAGNOSIS — Z794 Long term (current) use of insulin: Secondary | ICD-10-CM

## 2023-07-26 DIAGNOSIS — H35033 Hypertensive retinopathy, bilateral: Secondary | ICD-10-CM

## 2023-07-26 DIAGNOSIS — E113213 Type 2 diabetes mellitus with mild nonproliferative diabetic retinopathy with macular edema, bilateral: Secondary | ICD-10-CM

## 2023-07-26 DIAGNOSIS — H34831 Tributary (branch) retinal vein occlusion, right eye, with macular edema: Secondary | ICD-10-CM

## 2023-07-26 DIAGNOSIS — Z7984 Long term (current) use of oral hypoglycemic drugs: Secondary | ICD-10-CM

## 2023-07-26 DIAGNOSIS — H35371 Puckering of macula, right eye: Secondary | ICD-10-CM

## 2023-07-26 DIAGNOSIS — I10 Essential (primary) hypertension: Secondary | ICD-10-CM

## 2023-07-26 MED ORDER — FARICIMAB-SVOA 6 MG/0.05ML IZ SOSY
6.0000 mg | PREFILLED_SYRINGE | INTRAVITREAL | Status: AC | PRN
  Administered 2023-07-26: 6 mg via INTRAVITREAL

## 2023-07-26 NOTE — Progress Notes (Signed)
 [facility] - Clinic Note  07/26/2023     CHIEF COMPLAINT Patient presents for Retina Follow Up   HISTORY OF PRESENT ILLNESS: Cheryl Leonard is a 78 y.o. female who presents to the clinic today for:    HPI     Retina Follow Up   Patient presents with  CRVO/BRVO.  In right eye.  This started 4 weeks ago.  I, the attending physician,  performed the HPI with the patient and updated documentation appropriately.        Comments   Patient here for 4 weeks retina follow up for BRVO OD. Patient states vision about the same. No eye pain. Having MRI this week for pain in left leg.      Last edited by Valdemar Rogue, MD on 07/26/2023  1:08 PM.    Pt states   Referring physician: Cleotilde Oneil FALCON, MD [phone] [facility] [address] [facility] [place],  [place] [address]   HISTORICAL INFORMATION:   Selected notes from the MEDICAL RECORD NUMBER Referred by Dr. Estelle for concern of DME OD Lab Results  Component Value Date   HGBA1C 7.3 (H) 04/07/2023     CURRENT MEDICATIONS: No current outpatient medications on file. (Ophthalmic Drugs)   No current facility-administered medications for this visit. (Ophthalmic Drugs)   Current Outpatient Medications (Other)  Medication Sig   ALPRAZolam  (XANAX ) 0.25 MG tablet Take 0.25 mg by mouth daily as needed for anxiety.   amLODipine  (NORVASC ) 5 MG tablet Take 5 mg by mouth 2 (two) times daily.   aspirin  EC 81 MG tablet Take 1 tablet (81 mg total) by mouth daily.   cholecalciferol  (VITAMIN D ) 1000 units tablet Take 1,000 Units by mouth 2 (two) times daily.   dapagliflozin propanediol (FARXIGA) 5 MG TABS tablet Take 5 mg by mouth daily.   denosumab  (PROLIA ) 60 MG/ML SOLN injection Inject 60 mg into the skin every 6 (six) months.    escitalopram  (LEXAPRO ) 10 MG tablet Take 10 mg by mouth daily.   estradiol  (ESTRACE ) 0.1 MG/GM vaginal cream Place 1 Applicatorful vaginally daily as needed (vaginal  irritation).   famotidine  (PEPCID ) 40 MG tablet Take 40 mg by mouth daily.   furosemide  (LASIX ) 20 MG tablet Take 20 mg by mouth every Wednesday.   gabapentin  (NEURONTIN ) 300 MG capsule Take 300 mg by mouth at bedtime as needed (pain).   levothyroxine  (SYNTHROID , LEVOTHROID) 100 MCG tablet Take 100 mcg by mouth daily before breakfast.    lidocaine -prilocaine  (EMLA ) cream Apply 1 application topically as needed (apply prior to port a cath access).   Magnesium  500 MG TABS Take 500 mg by mouth every morning.    mirtazapine  (REMERON ) 15 MG tablet Take 15 mg by mouth as needed.   Multiple Vitamin (MULTIVITAMIN WITH MINERALS) TABS tablet Take 1 tablet by mouth daily. Centrum Silver   nystatin  cream (MYCOSTATIN ) Apply 1 application topically daily as needed (Yeast infection).   olmesartan (BENICAR) 20 MG tablet Take 20 mg by mouth daily.   pantoprazole  (PROTONIX ) 40 MG tablet Take 40 mg by mouth daily.   rivaroxaban  (XARELTO ) 20 MG TABS tablet Take 1 tablet (20 mg total) by mouth daily with supper.   rosuvastatin  (CRESTOR ) 20 MG tablet Take 20 mg by mouth every morning.   sodium bicarbonate  650 MG tablet Take 650 mg by mouth 2 (two) times daily.   TRESIBA FLEXTOUCH 200 UNIT/ML SOPN Inject 25-30 Units as directed at bedtime. Titrate according to fasting blood glucose  not to exceed 50 units a day   vitamin B-12 (CYANOCOBALAMIN ) 1000 MCG tablet Take 1,000 mcg by mouth daily.   vitamin C  (ASCORBIC ACID ) 250 MG tablet Take 250 mg by mouth daily.   vitamin E  400 UNIT capsule Take 400 Units by mouth daily.   zolpidem  (AMBIEN ) 10 MG tablet Take 10 mg by mouth at bedtime.   No current facility-administered medications for this visit. (Other)   REVIEW OF SYSTEMS: ROS   Positive for: Gastrointestinal, Musculoskeletal, Endocrine, Cardiovascular, Eyes, Respiratory Negative for: Constitutional, Neurological, Skin, Genitourinary, HENT, Psychiatric, Allergic/Imm, Heme/Lymph Last edited by Orval Asberry RAMAN, COA  on 07/26/2023  9:27 AM.     ALLERGIES Allergies  Allergen Reactions   Ace Inhibitors Other (See Comments)    Unknown  Other Reaction(s): Intolerance   PAST MEDICAL HISTORY Past Medical History:  Diagnosis Date   Anemia    Anxiety    Arthritis    Gout   Cataracts, both eyes    Diabetic retinopathy (HCC)    NPDR OU   Diverticulitis    GERD (gastroesophageal reflux disease)    Gout    Headache    h/o migraines   History of fracture of patella    right knee   History of positive PPD    Patient always shows positive   Hyperlipidemia    Hypertension    Hypertensive retinopathy    OU   Hypothyroidism    Lichen sclerosus 12/30/2013   of vulva   Metatarsal fracture    Nausea and vomiting 04/11/2023   Neuropathy    Osteopenia    Peripheral vascular disease (HCC)    Polyneuropathy    numbness and tingling in feet and toes   Renal insufficiency    Stage 3   Sleep apnea    does not use cpap-lost weight    Type 2 diabetes mellitus, uncontrolled    Past Surgical History:  Procedure Laterality Date   ABDOMINAL HYSTERECTOMY     AMPUTATION TOE Right 05/08/2020   Procedure: AMPUTATION TOE-Right 4th Toe;  Surgeon: Lennie Barter, DPM;  Location: [facility];  Service: Podiatry;  Laterality: Right;   AMPUTATION TOE Left 04/22/2021   Procedure: AMPUTATION TOE - 4TH METARSOPHANGEAL JOINT;  Surgeon: Lennie Barter, DPM;  Location: [facility];  Service: Podiatry;  Laterality: Left;   APPENDECTOMY     BREAST REDUCTION SURGERY  2001   CATARACT EXTRACTION     CESAREAN SECTION  1976   COLONOSCOPY  03/05/2013   Nml - due for repeat 03/06/2018   COLONOSCOPY WITH PROPOFOL  N/A 03/18/2019   Procedure: COLONOSCOPY WITH PROPOFOL ;  Surgeon: Toledo, Ladell POUR, MD;  Location: [facility];  Service: Gastroenterology;  Laterality: N/A;   DIAGNOSTIC LAPAROSCOPY     DILATION AND CURETTAGE OF UTERUS  1989   ENDARTERECTOMY FEMORAL Bilateral 03/09/2018   Procedure: ENDARTERECTOMY FEMORAL;  Surgeon: Marea Selinda RAMAN, MD;  Location: [facility];  Service: Vascular;  Laterality: Bilateral;   ENDARTERECTOMY POPLITEAL Left 03/09/2018   Procedure: ENDARTERECTOMY POPLITEAL AND SFA;  Surgeon: Marea Selinda RAMAN, MD;  Location: [facility];  Service: Vascular;  Laterality: Left;   ESOPHAGOGASTRODUODENOSCOPY  03/05/2013   ESOPHAGOGASTRODUODENOSCOPY (EGD) WITH PROPOFOL  N/A 03/18/2019   Procedure: ESOPHAGOGASTRODUODENOSCOPY (EGD) WITH PROPOFOL ;  Surgeon: Toledo, Ladell POUR, MD;  Location: [facility];  Service: Gastroenterology;  Laterality: N/A;   EYE SURGERY     Eyelid Surgery  2012   INTRAMEDULLARY (IM) NAIL INTERTROCHANTERIC Left 10/30/2015   Procedure: INTRAMEDULLARY (IM) NAIL INTERTROCHANTRIC ;  Surgeon: Ozell Flake, MD;  Location: [facility];  Service: Orthopedics;  Laterality: Left;   KYPHOPLASTY N/A 10/25/2018   Procedure: L4 KYPHOPLASTY;  Surgeon: Flake Ozell, MD;  Location: [facility];  Service: Orthopedics;  Laterality: N/A;   LAPAROSCOPIC HYSTERECTOMY  2000   total   LOWER EXTREMITY ANGIOGRAPHY Left 03/08/2017   Procedure: LOWER EXTREMITY ANGIOGRAPHY;  Surgeon: Marea Selinda RAMAN, MD;  Location: [facility];  Service: Cardiovascular;  Laterality: Left;   LOWER EXTREMITY ANGIOGRAPHY Left 10/30/2017   Procedure: LOWER EXTREMITY ANGIOGRAPHY;  Surgeon: Marea Selinda RAMAN, MD;  Location: [facility];  Service: Cardiovascular;  Laterality: Left;   LOWER EXTREMITY ANGIOGRAPHY Right 03/08/2018   Procedure: LOWER EXTREMITY ANGIOGRAPHY;  Surgeon: Marea Selinda RAMAN, MD;  Location: [facility];  Service: Cardiovascular;  Laterality: Right;   LOWER EXTREMITY ANGIOGRAPHY Left 10/01/2018   Procedure: LOWER EXTREMITY ANGIOGRAPHY;  Surgeon: Marea Selinda RAMAN, MD;  Location: [facility];  Service: Cardiovascular;  Laterality: Left;   LOWER EXTREMITY ANGIOGRAPHY Right 10/08/2018   Procedure: LOWER EXTREMITY ANGIOGRAPHY;  Surgeon: Marea Selinda RAMAN, MD;  Location: [facility];  Service: Cardiovascular;  Laterality: Right;    LOWER EXTREMITY ANGIOGRAPHY Right 05/07/2020   Procedure: Lower Extremity Angiography;  Surgeon: Marea Selinda RAMAN, MD;  Location: [facility];  Service: Cardiovascular;  Laterality: Right;   LYSIS OF ADHESION  01/25/2021   Procedure: LYSIS OF ADHESION;  Surgeon: Lane Shope, MD;  Location: [facility];  Service: General;;   PERIPHERAL VASCULAR INTERVENTION  03/08/2018   Procedure: PERIPHERAL VASCULAR INTERVENTION;  Surgeon: Marea Selinda RAMAN, MD;  Location: [facility];  Service: Cardiovascular;;   PORTA CATH INSERTION N/A 02/17/2020   Procedure: PORTA CATH INSERTION;  Surgeon: Marea Selinda RAMAN, MD;  Location: [facility];  Service: Cardiovascular;  Laterality: N/A;   REDUCTION MAMMAPLASTY  1997   SACROPLASTY N/A 10/25/2018   Procedure: S1 SACROPLASTY;  Surgeon: Flake Ozell, MD;  Location: [facility];  Service: Orthopedics;  Laterality: N/A;   FAMILY HISTORY Family History  Problem Relation Age of Onset   Coronary artery disease Father    Heart attack Father    Coronary artery disease Mother    Heart attack Mother    Ovarian cancer Sister 5       sister had hormonal therapy for IVF txs-which increased risk factor for ovarian cancer   Breast cancer Neg Hx    SOCIAL HISTORY Social History   Tobacco Use   Smoking status: Former    Current packs/day: 0.00    Average packs/day: 1 pack/day for 20.0 years (20.0 ttl pk-yrs)    Types: Cigarettes    Start date: 03/07/1976    Quit date: 03/07/1996    Years since quitting: 27.4    Passive exposure: Past   Smokeless tobacco: Never   Tobacco comments:    started smoking at age 51 but stopped smoking in 2000  Vaping Use   Vaping status: Never Used  Substance Use Topics   Alcohol use: No    Alcohol/week: 0.0 standard drinks of alcohol   Drug use: No       OPHTHALMIC EXAM: Base Eye Exam     Visual Acuity (Snellen - Linear)       Right Left   Dist [place] 20/40 -2 20/20         Tonometry (Tonopen, 9:25 AM)       Right Left    Pressure 17 18  Pupils       Dark Light Shape React APD   Right 3 2 Round Brisk None   Left 3 2 Round Brisk None         Visual Fields (Counting fingers)       Left Right    Full Full         Extraocular Movement       Right Left    Full, Ortho Full, Ortho         Neuro/Psych     Oriented x3: Yes   Mood/Affect: Normal         Dilation     Both eyes: 1.0% Mydriacyl, 2.5% Phenylephrine  @ 9:25 AM           Slit Lamp and Fundus Exam     External Exam       Right Left   External Normal Normal         Slit Lamp Exam       Right Left   Lids/Lashes dermatochalasis dermatochalasis   Conjunctiva/Sclera White and quiet Trace Injection   Cornea arcus; well healed cataract wound; trace Punctate epithelial erosions arcus; well healed cataract wound, trace PEE   Anterior Chamber Deep and quiet Deep and quiet   Iris Round and dilated Round and dilated   Lens PCIOL; open PC PCIOL; open PC   Anterior Vitreous syneresis, Posterior vitreous detachment, vitreous condensations inferiorly syneresis, Posterior vitreous detachment         Fundus Exam       Right Left   Disc Superior hyperemia -- stably improved, mild Pallor, vascular loops Pink and Sharp   C/D Ratio 0.6 0.5   Macula Flat, Blunted foveal reflex, +cystic changes centrally -- improved, +Epiretinal membrane, minimal MA flat; good foveal reflex, no heme or edema, small pigment clump IT to fovea   Vessels attenuated, Tortuous attenuated, Tortuous   Periphery Attached; scattered DBH -- greastest temporal periphery- improved Attached, no heme           IMAGING AND PROCEDURES  Imaging and Procedures for 04/25/17  OCT, Retina - OU - Both Eyes       Right Eye Quality was borderline. Central Foveal Thickness: 351. Progression has improved. Findings include no SRF, abnormal foveal contour, epiretinal membrane, intraretinal fluid (Interval improvement in central IRF / cystic changes and  foveal contour. ).   Left Eye Quality was good. Central Foveal Thickness: 291. Progression has been stable. Findings include normal foveal contour, no IRF, no SRF.   Notes *Images captured and stored on drive  Diagnosis / Impression:  OD: Interval improvement in central IRF / cystic changes and foveal contour.  OS: NFP; no IRF/SRF--stable  Clinical management:  See below  Abbreviations: NFP - Normal foveal profile. CME - cystoid macular edema. PED - pigment epithelial detachment. IRF - intraretinal fluid. SRF - subretinal fluid. EZ - ellipsoid zone. ERM - epiretinal membrane. ORA - outer retinal atrophy. ORT - outer retinal tubulation. SRHM - subretinal hyper-reflective material      Intravitreal Injection, Pharmacologic Agent - OD - Right Eye       Time Out 07/26/2023. 11:02 AM. Confirmed correct patient, procedure, site, and patient consented.   Anesthesia Topical anesthesia was used. Anesthetic medications included Lidocaine  2%, Proparacaine 0.5%.   Procedure Preparation included 5% betadine to ocular surface, eyelid speculum. A supplied (30 g) needle was used.   Injection: 6 mg faricimab -svoa 6 MG/0.05ML   Route: Intravitreal, Site: Right Eye   NDC:  49757-903-93, Lot: [id number], Expiration date: 05/02/2024, Waste: 0 mL   Post-op Post injection exam found visual acuity of at least counting fingers. The patient tolerated the procedure well. There were no complications. The patient received written and verbal post procedure care education. Post injection medications were not given.            ASSESSMENT/PLAN:    ICD-10-CM   1. Branch retinal vein occlusion of right eye with macular edema  H34.8310 OCT, Retina - OU - Both Eyes    Intravitreal Injection, Pharmacologic Agent - OD - Right Eye    faricimab -svoa (VABYSMO ) 6mg /0.52mL intravitreal injection    2. Both eyes affected by mild nonproliferative diabetic retinopathy with macular edema, associated with type 2  diabetes mellitus (HCC)  Z88.6786     3. Long term (current) use of oral hypoglycemic drugs  Z79.84     4. Current use of insulin  (HCC)  Z79.4     5. Essential hypertension  I10     6. Hypertensive retinopathy of both eyes  H35.033     7. Epiretinal membrane (ERM) of right eye  H35.371     8. Pseudophakia of both eyes  Z96.1      1. BRVO w/ CME OD - by history, pt states symptoms first noticed 2 wks prior to presentation, but reports changes may have occurred prior  - initial exam with differential tortuosity of vessels (OD > OS) - FA (02.10.20) shows mild late staining / leakage in macula, staining / leakage of disc -- improving CME - differential includes DM2 (DME), hypertensive retinopathy, inflammatory etiology / uveitis - S/P IVA OD #1 (02.08.19), #2 (03.11.19), #3 (04.09.19), #4 (05.20.19), #5 (02.10.20) - gave IVA OD on 2.10.20 due to pending Eylea4U for 2020 -- resulted in increased IRF/CME - review of OCT show persistent IRF and cystic changes --  resistance to IVA  - June 2019 -- switched therapies:  ============================== - S/P IVE OD #1 (06.24.19), #2 (07.24.19), #3 (09.04.19), #4 (10.30.19),#5 (12.30.19), #6 (03.23.20), #7 (05.05.20), #8 (07.16.20), #9 (07.17.20), #10 (08.28.20), #11 (10.13.20), # 12 (11.17.20), #13 (2.8.21), #14 (03.09.21), #15 (04.13.21), #16 (05.11.21), #17 (06.17.21), #18 (07.23.21), #19 (08.30.21), #20 (10.04.21), #21 (11.08.21), #22 (12.08.21), #23 (01.31.22), #24 (02.28.22), #25 (04.01.22), #26 (06.15.22), #27 (07.13.22), #28 (08.17.22), #29 (09.21.22), #30 (10.19.22), #31 (11.16.22), #32 (12.16.22), #33 (01.13.23), #34 (02.10.23), #35 (03.15.23), #36 (04.14.23), #37 (05.15.23), #38 (06.12.23), #39 (07.10.23), #40 (08.07.23), #41 (09.06.23), #42 (10.04.23), #43 (11.01.23) -- IVE resistance ================================ **interval increase in IRF at 5 weeks on 07.01.24 and at 6 wks on 02.28.25 (IVV)** - s/p IVV OD #1 (12.04.23), #2  (01.03.24), #3 (01.31.24), #4 (02.28.24), #5 (03.29.24), #6 (04.24.24), #7 (05.28.24), #8 (07.01.24), #9 (07.29.24), #10 (08.26.24), #11 (09.26.24), #12 (10.25.24), #13 (11.25.24), #14 (01.07.24), #15 (02.18.25), #16 (06.25.25) - s/p IVE HD OD #1 (03.25.25), #2 (04.29.25), #3 (05.27.25) - OCT today shows Interval improvement in central IRF / cystic changes and foveal contour.   - BCVA OD stable at 20/40   - recommend IVV OD #17 today, 07.23.25 w/ f/u in 4 weeks  - RBA of procedure discussed, questions answered  - IVV informed consent obtained and signed, 06.25.25 (OD)  - see procedure note  - f/u in 4 weeks  -- DFE/OCT/possible injection  2-4. Mild nonproliferative diabetic retinopathy, both eyes  - s/p IVE HD #1 (03.25.25), #2 (04.29.25), #3 (05.27.25) - A1c 7.3 on 04.04.25  - likely DME component contributing to CME OD  - OD shows interval increase in central  IRF   - OS with minimal diabetic retinopathy  - Eylea  HD informed consent form obtained, signed and scanned on 03.25.25  - pt approved for Eylea  HD through Medicare and supplement  - monitor OS  - f/u 4 weeks, DFE, OCT  5,6. Hypertensive retinopathy OU - stable  - as above, may have contributing to CME OD  - discussed importance of tight BP control  - monitor  7. Epiretinal membrane, right eye   - stable nasal ERM  - no indication for surgery at this time  8. Pseudophakia OU  - s/p CE/IOL OU by cataract surgeon in [facility]  - doing well  - monitor  Ophthalmic Meds Ordered this visit:  Meds ordered this encounter  Medications   faricimab -svoa (VABYSMO ) 6mg /0.61mL intravitreal injection     Return in about 4 weeks (around 08/23/2023) for BRVO OD, DFE, OCT, Possible Injxn.   This document serves as a record of services personally performed by Redell JUDITHANN Hans, MD, PhD. It was created on their behalf by Almetta Pesa, an ophthalmic technician. The creation of this record is the provider's dictation and/or activities during  the visit.    Electronically signed by: Almetta Pesa, OA, 07/26/23  1:09 PM  Redell JUDITHANN Hans, M.D., Ph.D. Diseases & Surgery of the Retina and Vitreous [facility]  I have reviewed the above documentation for accuracy and completeness, and I agree with the above. Redell JUDITHANN Hans, M.D., Ph.D. 07/26/23 1:10 PM   Abbreviations: M myopia (nearsighted); A astigmatism; H hyperopia (farsighted); P presbyopia; Mrx spectacle prescription;  CTL contact lenses; OD right eye; OS left eye; OU both eyes  XT exotropia; ET esotropia; PEK punctate epithelial keratitis; PEE punctate epithelial erosions; DES dry eye syndrome; MGD meibomian gland dysfunction; ATs artificial tears; PFAT's preservative free artificial tears; NSC nuclear sclerotic cataract; PSC posterior subcapsular cataract; ERM epi-retinal membrane; PVD posterior vitreous detachment; RD retinal detachment; DM diabetes mellitus; DR diabetic retinopathy; NPDR non-proliferative diabetic retinopathy; PDR proliferative diabetic retinopathy; CSME clinically significant macular edema; DME diabetic macular edema; dbh dot blot hemorrhages; CWS cotton wool spot; POAG primary open angle glaucoma; C/D cup-to-disc ratio; HVF humphrey visual field; GVF goldmann visual field; OCT optical coherence tomography; IOP intraocular pressure; BRVO Branch retinal vein occlusion; CRVO central retinal vein occlusion; CRAO central retinal artery occlusion; BRAO branch retinal artery occlusion; RT retinal tear; SB scleral buckle; PPV pars plana vitrectomy; VH Vitreous hemorrhage; PRP panretinal laser photocoagulation; IVK intravitreal kenalog; VMT vitreomacular traction; MH Macular hole;  NVD neovascularization of the disc; NVE neovascularization elsewhere; AREDS age related eye disease study; ARMD age related macular degeneration; POAG primary open angle glaucoma; EBMD epithelial/anterior basement membrane dystrophy; ACIOL anterior chamber intraocular lens;  IOL intraocular lens; PCIOL posterior chamber intraocular lens; Phaco/IOL phacoemulsification with intraocular lens placement; PRK photorefractive keratectomy; LASIK laser assisted in situ keratomileusis; HTN hypertension; DM diabetes mellitus; COPD chronic obstructive pulmonary disease

## 2023-07-28 ENCOUNTER — Ambulatory Visit
Admission: RE | Admit: 2023-07-28 | Discharge: 2023-07-28 | Disposition: A | Discharge status: Home / Self Care | Source: Ambulatory Visit | Attending: Physician Assistant | Admitting: Physician Assistant

## 2023-07-28 DIAGNOSIS — M545 Low back pain, unspecified: Secondary | ICD-10-CM | POA: Diagnosis present

## 2023-07-28 DIAGNOSIS — W19XXXA Unspecified fall, initial encounter: Secondary | ICD-10-CM | POA: Diagnosis not present

## 2023-07-28 DIAGNOSIS — M48061 Spinal stenosis, lumbar region without neurogenic claudication: Secondary | ICD-10-CM | POA: Diagnosis not present

## 2023-07-28 DIAGNOSIS — M5126 Other intervertebral disc displacement, lumbar region: Secondary | ICD-10-CM | POA: Diagnosis not present

## 2023-07-28 DIAGNOSIS — Y92009 Unspecified place in unspecified non-institutional (private) residence as the place of occurrence of the external cause: Secondary | ICD-10-CM | POA: Diagnosis not present

## 2023-07-28 DIAGNOSIS — M2578 Osteophyte, vertebrae: Secondary | ICD-10-CM | POA: Insufficient documentation

## 2023-07-28 DIAGNOSIS — Z8781 Personal history of (healed) traumatic fracture: Secondary | ICD-10-CM | POA: Diagnosis not present

## 2023-07-28 DIAGNOSIS — M47816 Spondylosis without myelopathy or radiculopathy, lumbar region: Secondary | ICD-10-CM | POA: Diagnosis not present

## 2023-08-15 ENCOUNTER — Other Ambulatory Visit: Payer: Self-pay | Admitting: Family Medicine

## 2023-08-15 ENCOUNTER — Inpatient Hospital Stay
Admission: RE | Admit: 2023-08-15 | Discharge: 2023-08-15 | Disposition: A | Discharge status: Home / Self Care | Payer: Self-pay | Source: Ambulatory Visit | Attending: Orthopedic Surgery | Admitting: Orthopedic Surgery

## 2023-08-15 DIAGNOSIS — Z049 Encounter for examination and observation for unspecified reason: Secondary | ICD-10-CM

## 2023-08-17 NOTE — Progress Notes (Signed)
 [facility] - Clinic Note  08/23/2023     CHIEF COMPLAINT Patient presents for Retina Follow Up   HISTORY OF PRESENT ILLNESS: Cheryl Leonard is a 78 y.o. female who presents to the clinic today for:    HPI     Retina Follow Up   Patient presents with  CRVO/BRVO.  In both eyes.  This started 4 weeks ago.  Duration of 4 weeks.  Since onset it is stable.  I, the attending physician,  performed the HPI with the patient and updated documentation appropriately.        Comments   4 week retina follow up BRVO OD pt is reporting no vision changes noticed she denies flashes or floaters her last reading 118      Last edited by Valdemar Rogue, MD on 08/23/2023 10:25 PM.     Pt states she's doing well, vision stable. Eldest grandson   Referring physician: Cleotilde Oneil FALCON, MD [phone] [facility] [address] [facility] [place],  [place] [address]   HISTORICAL INFORMATION:   Selected notes from the MEDICAL RECORD NUMBER Referred by Dr. Estelle for concern of DME OD Lab Results  Component Value Date   HGBA1C 7.3 (H) 04/07/2023     CURRENT MEDICATIONS: No current outpatient medications on file. (Ophthalmic Drugs)   No current facility-administered medications for this visit. (Ophthalmic Drugs)   Current Outpatient Medications (Other)  Medication Sig   ALPRAZolam (XANAX) 0.25 MG tablet Take 0.25 mg by mouth daily as needed for anxiety.   amLODipine (NORVASC) 5 MG tablet Take 5 mg by mouth 2 (two) times daily.   aspirin EC 81 MG tablet Take 1 tablet (81 mg total) by mouth daily.   cholecalciferol (VITAMIN D) 1000 units tablet Take 1,000 Units by mouth 2 (two) times daily.   dapagliflozin propanediol (FARXIGA) 5 MG TABS tablet Take 5 mg by mouth daily.   denosumab (PROLIA) 60 MG/ML SOLN injection Inject 60 mg into the skin every 6 (six) months.    escitalopram (LEXAPRO) 10 MG tablet Take 10 mg by mouth daily.   estradiol (ESTRACE) 0.1 MG/GM  vaginal cream Place 1 Applicatorful vaginally daily as needed (vaginal irritation).   famotidine (PEPCID) 40 MG tablet Take 40 mg by mouth daily.   furosemide (LASIX) 20 MG tablet Take 20 mg by mouth every Wednesday.   gabapentin (NEURONTIN) 300 MG capsule Take 300 mg by mouth at bedtime as needed (pain).   levothyroxine (SYNTHROID, LEVOTHROID) 100 MCG tablet Take 100 mcg by mouth daily before breakfast.    lidocaine-prilocaine (EMLA) cream Apply 1 application topically as needed (apply prior to port a cath access).   Magnesium 500 MG TABS Take 500 mg by mouth every morning.    mirtazapine (REMERON) 15 MG tablet Take 15 mg by mouth as needed.   Multiple Vitamin (MULTIVITAMIN WITH MINERALS) TABS tablet Take 1 tablet by mouth daily. Centrum Silver   nystatin cream (MYCOSTATIN) Apply 1 application topically daily as needed (Yeast infection).   olmesartan (BENICAR) 20 MG tablet Take 20 mg by mouth daily.   OZEMPIC, 0.25 OR 0.5 MG/DOSE, 2 MG/3ML SOPN Inject 0.5 mg into the skin once a week.   pantoprazole (PROTONIX) 40 MG tablet Take 40 mg by mouth daily.   rivaroxaban (XARELTO) 20 MG TABS tablet Take 1 tablet (20 mg total) by mouth daily with supper.   rosuvastatin (CRESTOR) 20 MG tablet Take 20 mg by mouth every morning.   sodium bicarbonate 650  MG tablet Take 650 mg by mouth 2 (two) times daily.   TRESIBA FLEXTOUCH 200 UNIT/ML SOPN Inject 25-30 Units as directed at bedtime. Titrate according to fasting blood glucose not to exceed 50 units a day   vitamin B-12 (CYANOCOBALAMIN ) 1000 MCG tablet Take 1,000 mcg by mouth daily.   vitamin C  (ASCORBIC ACID ) 250 MG tablet Take 250 mg by mouth daily.   vitamin E  400 UNIT capsule Take 400 Units by mouth daily.   zolpidem  (AMBIEN ) 10 MG tablet Take 10 mg by mouth at bedtime.   No current facility-administered medications for this visit. (Other)   REVIEW OF SYSTEMS: ROS   Positive for: Gastrointestinal, Musculoskeletal, Endocrine, Cardiovascular, Eyes,  Respiratory Negative for: Constitutional, Neurological, Skin, Genitourinary, HENT, Psychiatric, Allergic/Imm, Heme/Lymph Last edited by Resa Delon ORN, COT on 08/23/2023  1:39 PM.      ALLERGIES Allergies  Allergen Reactions   Ace Inhibitors Other (See Comments)    Unknown  Other Reaction(s): Intolerance   PAST MEDICAL HISTORY Past Medical History:  Diagnosis Date   Anemia    Anxiety    Arthritis    Gout   Cataracts, both eyes    Diabetic retinopathy (HCC)    NPDR OU   Diverticulitis    GERD (gastroesophageal reflux disease)    Gout    Headache    h/o migraines   History of fracture of patella    right knee   History of positive PPD    Patient always shows positive   Hyperlipidemia    Hypertension    Hypertensive retinopathy    OU   Hypothyroidism    Lichen sclerosus 12/30/2013   of vulva   Metatarsal fracture    Nausea and vomiting 04/11/2023   Neuropathy    Osteopenia    Peripheral vascular disease (HCC)    Polyneuropathy    numbness and tingling in feet and toes   Renal insufficiency    Stage 3   Sleep apnea    does not use cpap-lost weight    Type 2 diabetes mellitus, uncontrolled    Past Surgical History:  Procedure Laterality Date   ABDOMINAL HYSTERECTOMY     AMPUTATION TOE Right 05/08/2020   Procedure: AMPUTATION TOE-Right 4th Toe;  Surgeon: Lennie Barter, DPM;  Location: [facility];  Service: Podiatry;  Laterality: Right;   AMPUTATION TOE Left 04/22/2021   Procedure: AMPUTATION TOE - 4TH METARSOPHANGEAL JOINT;  Surgeon: Lennie Barter, DPM;  Location: [facility];  Service: Podiatry;  Laterality: Left;   APPENDECTOMY     BREAST REDUCTION SURGERY  2001   CATARACT EXTRACTION     CESAREAN SECTION  1976   COLONOSCOPY  03/05/2013   Nml - due for repeat 03/06/2018   COLONOSCOPY WITH PROPOFOL  N/A 03/18/2019   Procedure: COLONOSCOPY WITH PROPOFOL ;  Surgeon: Toledo, Ladell POUR, MD;  Location: [facility];  Service: Gastroenterology;  Laterality: N/A;    DIAGNOSTIC LAPAROSCOPY     DILATION AND CURETTAGE OF UTERUS  1989   ENDARTERECTOMY FEMORAL Bilateral 03/09/2018   Procedure: ENDARTERECTOMY FEMORAL;  Surgeon: Marea Selinda RAMAN, MD;  Location: [facility];  Service: Vascular;  Laterality: Bilateral;   ENDARTERECTOMY POPLITEAL Left 03/09/2018   Procedure: ENDARTERECTOMY POPLITEAL AND SFA;  Surgeon: Marea Selinda RAMAN, MD;  Location: [facility];  Service: Vascular;  Laterality: Left;   ESOPHAGOGASTRODUODENOSCOPY  03/05/2013   ESOPHAGOGASTRODUODENOSCOPY (EGD) WITH PROPOFOL  N/A 03/18/2019   Procedure: ESOPHAGOGASTRODUODENOSCOPY (EGD) WITH PROPOFOL ;  Surgeon: Toledo, Ladell POUR, MD;  Location: [facility];  Service: Gastroenterology;  Laterality: N/A;   EYE SURGERY     Eyelid Surgery  2012   INTRAMEDULLARY (IM) NAIL INTERTROCHANTERIC Left 10/30/2015   Procedure: INTRAMEDULLARY (IM) NAIL INTERTROCHANTRIC ;  Surgeon: Ozell Flake, MD;  Location: [facility];  Service: Orthopedics;  Laterality: Left;   KYPHOPLASTY N/A 10/25/2018   Procedure: L4 KYPHOPLASTY;  Surgeon: Flake Ozell, MD;  Location: [facility];  Service: Orthopedics;  Laterality: N/A;   LAPAROSCOPIC HYSTERECTOMY  2000   total   LOWER EXTREMITY ANGIOGRAPHY Left 03/08/2017   Procedure: LOWER EXTREMITY ANGIOGRAPHY;  Surgeon: Marea Selinda RAMAN, MD;  Location: [facility];  Service: Cardiovascular;  Laterality: Left;   LOWER EXTREMITY ANGIOGRAPHY Left 10/30/2017   Procedure: LOWER EXTREMITY ANGIOGRAPHY;  Surgeon: Marea Selinda RAMAN, MD;  Location: [facility];  Service: Cardiovascular;  Laterality: Left;   LOWER EXTREMITY ANGIOGRAPHY Right 03/08/2018   Procedure: LOWER EXTREMITY ANGIOGRAPHY;  Surgeon: Marea Selinda RAMAN, MD;  Location: [facility];  Service: Cardiovascular;  Laterality: Right;   LOWER EXTREMITY ANGIOGRAPHY Left 10/01/2018   Procedure: LOWER EXTREMITY ANGIOGRAPHY;  Surgeon: Marea Selinda RAMAN, MD;  Location: [facility];  Service: Cardiovascular;  Laterality: Left;   LOWER  EXTREMITY ANGIOGRAPHY Right 10/08/2018   Procedure: LOWER EXTREMITY ANGIOGRAPHY;  Surgeon: Marea Selinda RAMAN, MD;  Location: [facility];  Service: Cardiovascular;  Laterality: Right;   LOWER EXTREMITY ANGIOGRAPHY Right 05/07/2020   Procedure: Lower Extremity Angiography;  Surgeon: Marea Selinda RAMAN, MD;  Location: [facility];  Service: Cardiovascular;  Laterality: Right;   LYSIS OF ADHESION  01/25/2021   Procedure: LYSIS OF ADHESION;  Surgeon: Lane Shope, MD;  Location: [facility];  Service: General;;   PERIPHERAL VASCULAR INTERVENTION  03/08/2018   Procedure: PERIPHERAL VASCULAR INTERVENTION;  Surgeon: Marea Selinda RAMAN, MD;  Location: [facility];  Service: Cardiovascular;;   PORTA CATH INSERTION N/A 02/17/2020   Procedure: PORTA CATH INSERTION;  Surgeon: Marea Selinda RAMAN, MD;  Location: [facility];  Service: Cardiovascular;  Laterality: N/A;   REDUCTION MAMMAPLASTY  1997   SACROPLASTY N/A 10/25/2018   Procedure: S1 SACROPLASTY;  Surgeon: Flake Ozell, MD;  Location: [facility];  Service: Orthopedics;  Laterality: N/A;   FAMILY HISTORY Family History  Problem Relation Age of Onset   Coronary artery disease Father    Heart attack Father    Coronary artery disease Mother    Heart attack Mother    Ovarian cancer Sister 77       sister had hormonal therapy for IVF txs-which increased risk factor for ovarian cancer   Breast cancer Neg Hx    SOCIAL HISTORY Social History   Tobacco Use   Smoking status: Former    Current packs/day: 0.00    Average packs/day: 1 pack/day for 20.0 years (20.0 ttl pk-yrs)    Types: Cigarettes    Start date: 03/07/1976    Quit date: 03/07/1996    Years since quitting: 27.4    Passive exposure: Past   Smokeless tobacco: Never   Tobacco comments:    started smoking at age 78 but stopped smoking in 2000  Vaping Use   Vaping status: Never Used  Substance Use Topics   Alcohol use: No    Alcohol/week: 0.0 standard drinks of alcohol    Drug use: No       OPHTHALMIC EXAM: Base Eye Exam     Visual Acuity (Snellen - Linear)       Right Left  Dist [place] 20/40 20/20 -1         Tonometry (Tonopen, 1:42 PM)       Right Left   Pressure 15 16         Pupils       Pupils Dark Light Shape React APD   Right PERRL 3 2 Round Brisk None   Left PERRL 3 2 Round Brisk None         Visual Fields       Left Right    Full Full         Extraocular Movement       Right Left    Full, Ortho Full, Ortho         Neuro/Psych     Oriented x3: Yes   Mood/Affect: Normal         Dilation     Both eyes: 2.5% Phenylephrine  @ 1:42 PM           Slit Lamp and Fundus Exam     External Exam       Right Left   External Normal Normal         Slit Lamp Exam       Right Left   Lids/Lashes dermatochalasis dermatochalasis   Conjunctiva/Sclera White and quiet Trace Injection   Cornea arcus; well healed cataract wound; trace Punctate epithelial erosions arcus; well healed cataract wound, trace PEE   Anterior Chamber Deep and quiet Deep and quiet   Iris Round and dilated Round and dilated   Lens PCIOL; open PC PCIOL; open PC   Anterior Vitreous syneresis, Posterior vitreous detachment, vitreous condensations inferiorly syneresis, Posterior vitreous detachment         Fundus Exam       Right Left   Disc Superior hyperemia -- stably improved, mild Pallor, vascular loops Pink and Sharp   C/D Ratio 0.6 0.5   Macula Flat, Blunted foveal reflex, +cystic changes centrally -- improved, +Epiretinal membrane, minimal MA flat; good foveal reflex, no heme or edema, small pigment clump IT to fovea   Vessels attenuated, Tortuous attenuated, Tortuous   Periphery Attached; scattered DBH -- greastest temporal periphery- improved Attached, no heme           IMAGING AND PROCEDURES  Imaging and Procedures for 04/25/17  OCT, Retina - OU - Both Eyes       Right Eye Quality was borderline. Central Foveal  Thickness: 329. Progression has improved. Findings include no IRF, no SRF, abnormal foveal contour, epiretinal membrane (Interval improvement in IRF / cystic changes temporal fovea).   Left Eye Quality was good. Central Foveal Thickness: 285. Progression has been stable. Findings include normal foveal contour, no IRF, no SRF.   Notes *Images captured and stored on drive  Diagnosis / Impression:  OD: Interval improvement in IRF / cystic changes temporal fovea OS: NFP; no IRF/SRF--stable  Clinical management:  See below  Abbreviations: NFP - Normal foveal profile. CME - cystoid macular edema. PED - pigment epithelial detachment. IRF - intraretinal fluid. SRF - subretinal fluid. EZ - ellipsoid zone. ERM - epiretinal membrane. ORA - outer retinal atrophy. ORT - outer retinal tubulation. SRHM - subretinal hyper-reflective material      Intravitreal Injection, Pharmacologic Agent - OD - Right Eye       Time Out 08/23/2023. 2:55 PM. Confirmed correct patient, procedure, site, and patient consented.   Anesthesia Topical anesthesia was used. Anesthetic medications included Lidocaine  2%, Proparacaine 0.5%.   Procedure Preparation included 5%  betadine to ocular surface, eyelid speculum. A supplied (30 g) needle was used.   Injection: 6 mg faricimab -svoa 6 MG/0.05ML   Route: Intravitreal, Site: Right Eye   NDC: 49757-903-93, Lot: [id number], Expiration date: 08/01/2024, Waste: 0 mL   Post-op Post injection exam found visual acuity of at least counting fingers. The patient tolerated the procedure well. There were no complications. The patient received written and verbal post procedure care education. Post injection medications were not given.            ASSESSMENT/PLAN:    ICD-10-CM   1. Branch retinal vein occlusion of right eye with macular edema  H34.8310 OCT, Retina - OU - Both Eyes    Intravitreal Injection, Pharmacologic Agent - OD - Right Eye    faricimab -svoa (VABYSMO )  6mg /0.46mL intravitreal injection    2. Both eyes affected by mild nonproliferative diabetic retinopathy with macular edema, associated with type 2 diabetes mellitus (HCC)  Z88.6786     3. Long term (current) use of oral hypoglycemic drugs  Z79.84     4. Current use of insulin  (HCC)  Z79.4     5. Essential hypertension  I10     6. Hypertensive retinopathy of both eyes  H35.033     7. Epiretinal membrane (ERM) of right eye  H35.371     8. Pseudophakia of both eyes  Z96.1       1. BRVO w/ CME OD - by history, pt states symptoms first noticed 2 wks prior to presentation, but reports changes may have occurred prior  - initial exam with differential tortuosity of vessels (OD > OS) - FA (02.10.20) shows mild late staining / leakage in macula, staining / leakage of disc -- improving CME - differential includes DM2 (DME), hypertensive retinopathy, inflammatory etiology / uveitis - S/P IVA OD #1 (02.08.19), #2 (03.11.19), #3 (04.09.19), #4 (05.20.19), #5 (02.10.20) - gave IVA OD on 2.10.20 due to pending Eylea4U for 2020 -- resulted in increased IRF/CME - review of OCT show persistent IRF and cystic changes --  resistance to IVA  - June 2019 -- switched therapies:  ============================== - S/P IVE OD #1 (06.24.19), #2 (07.24.19), #3 (09.04.19), #4 (10.30.19),#5 (12.30.19), #6 (03.23.20), #7 (05.05.20), #8 (07.16.20), #9 (07.17.20), #10 (08.28.20), #11 (10.13.20), # 12 (11.17.20), #13 (2.8.21), #14 (03.09.21), #15 (04.13.21), #16 (05.11.21), #17 (06.17.21), #18 (07.23.21), #19 (08.30.21), #20 (10.04.21), #21 (11.08.21), #22 (12.08.21), #23 (01.31.22), #24 (02.28.22), #25 (04.01.22), #26 (06.15.22), #27 (07.13.22), #28 (08.17.22), #29 (09.21.22), #30 (10.19.22), #31 (11.16.22), #32 (12.16.22), #33 (01.13.23), #34 (02.10.23), #35 (03.15.23), #36 (04.14.23), #37 (05.15.23), #38 (06.12.23), #39 (07.10.23), #40 (08.07.23), #41 (09.06.23), #42 (10.04.23), #43 (11.01.23) -- IVE  resistance ================================ **interval increase in IRF at 5 weeks on 07.01.24 and at 6 wks on 02.28.25 (IVV)** - s/p IVV OD #1 (12.04.23), #2 (01.03.24), #3 (01.31.24), #4 (02.28.24), #5 (03.29.24), #6 (04.24.24), #7 (05.28.24), #8 (07.01.24), #9 (07.29.24), #10 (08.26.24), #11 (09.26.24), #12 (10.25.24), #13 (11.25.24), #14 (01.07.24), #15 (02.18.25), #16 (06.25.25), #17 (07.23.25) - s/p IVE HD OD #1 (03.25.25), #2 (04.29.25), #3 (05.27.25) - OCT today shows Interval improvement in IRF / cystic changes temporal fovea at 4 wks  - BCVA OD stable at 20/40   - recommend IVV today OD #18 (08.20.25) w/ f/u in 4 weeks  - RBA of procedure discussed, questions answered  - IVV informed consent obtained and signed, 06.25.25 (OD)  - see procedure note  - f/u in 4 weeks  -- DFE/OCT/possible injection  2-4. Mild nonproliferative diabetic retinopathy, both  eyes  - s/p IVE HD #1 (03.25.25), #2 (04.29.25), #3 (05.27.25) - A1c 5.6 on 8.14.25, 7.3 on 04.04.25  - likely DME component contributing to CME OD  - OD Interval improvement in IRF / cystic changes temporal fovea   - OS with minimal diabetic retinopathy  - Eylea  HD informed consent form obtained, signed and scanned on 03.25.25  - pt approved for Eylea  HD through Medicare and supplement  - monitor OS  - f/u 4 weeks, DFE, OCT  5,6. Hypertensive retinopathy OU - stable  - as above, may have contributing to CME OD  - discussed importance of tight BP control  - monitor  7. Epiretinal membrane, right eye   - stable nasal ERM  - no indication for surgery at this time  8. Pseudophakia OU  - s/p CE/IOL OU by cataract surgeon in [facility]  - doing well  - monitor  Ophthalmic Meds Ordered this visit:  Meds ordered this encounter  Medications   faricimab -svoa (VABYSMO ) 6mg /0.49mL intravitreal injection     Return in about 4 weeks (around 09/20/2023) for RVO OD, Dilated Exam, OCT, Possible Injxn.   This document serves as a record of  services personally performed by Redell JUDITHANN Hans, MD, PhD. It was created on their behalf by Almetta Pesa, an ophthalmic technician. The creation of this record is the provider's dictation and/or activities during the visit.    Electronically signed by: Almetta Pesa, OA, 08/23/23  10:41 PM  Redell JUDITHANN Hans, M.D., Ph.D. Diseases & Surgery of the Retina and Vitreous [name]  I have reviewed the above documentation for accuracy and completeness, and I agree with the above. Redell JUDITHANN Hans, M.D., Ph.D. 08/23/23 10:42 PM   Abbreviations: M myopia (nearsighted); A astigmatism; H hyperopia (farsighted); P presbyopia; Mrx spectacle prescription;  CTL contact lenses; OD right eye; OS left eye; OU both eyes  XT exotropia; ET esotropia; PEK punctate epithelial keratitis; PEE punctate epithelial erosions; DES dry eye syndrome; MGD meibomian gland dysfunction; ATs artificial tears; PFAT's preservative free artificial tears; NSC nuclear sclerotic cataract; PSC posterior subcapsular cataract; ERM epi-retinal membrane; PVD posterior vitreous detachment; RD retinal detachment; DM diabetes mellitus; DR diabetic retinopathy; NPDR non-proliferative diabetic retinopathy; PDR proliferative diabetic retinopathy; CSME clinically significant macular edema; DME diabetic macular edema; dbh dot blot hemorrhages; CWS cotton wool spot; POAG primary open angle glaucoma; C/D cup-to-disc ratio; HVF humphrey visual field; GVF goldmann visual field; OCT optical coherence tomography; IOP intraocular pressure; BRVO Branch retinal vein occlusion; CRVO central retinal vein occlusion; CRAO central retinal artery occlusion; BRAO branch retinal artery occlusion; RT retinal tear; SB scleral buckle; PPV pars plana vitrectomy; VH Vitreous hemorrhage; PRP panretinal laser photocoagulation; IVK intravitreal kenalog; VMT vitreomacular traction; MH Macular hole;  NVD neovascularization of the disc; NVE  neovascularization elsewhere; AREDS age related eye disease study; ARMD age related macular degeneration; POAG primary open angle glaucoma; EBMD epithelial/anterior basement membrane dystrophy; ACIOL anterior chamber intraocular lens; IOL intraocular lens; PCIOL posterior chamber intraocular lens; Phaco/IOL phacoemulsification with intraocular lens placement; PRK photorefractive keratectomy; LASIK laser assisted in situ keratomileusis; HTN hypertension; DM diabetes mellitus; COPD chronic obstructive pulmonary disease

## 2023-08-18 ENCOUNTER — Encounter: Payer: Self-pay | Admitting: Orthopedic Surgery

## 2023-08-18 NOTE — Progress Notes (Unsigned)
 Referring Physician:  Suzzane Charleston, PA-C [address] [facility] [address] [place],  [place] [address]  Primary Physician:  Cleotilde Oneil FALCON, MD  History of Present Illness: 08/24/2023 Cheryl Leonard has a history of HTN, PVD, DM with neuropathy, hypothyroidism, osteoporosis, CKD stage 3b, iron deficiency anemia, hyperlipidemia, lichen sclerosus, depression.   Saw ortho on 08/14/23 with LBP after a fall 3 months ago. History of L4 compression fracture for which she had kyphoplasty done.   She had left GTB injection on 08/14/23 by Dr. Kathlynn.   Had a fall about 3 months ago, she started having left hip pain to the knee.  She has constant LBP with left lateral leg pain to her knee along with numbness and tingling. Has some radiation into her left groin. No right leg pain. Pain started after above fall. Pain is worse with sitting and better with walking. She has weakness in left leg as well.   No significant relief with above left GTB injection.   She is on XARELTO.   Tobacco use: Does not smoke.   Bowel/Bladder Dysfunction: none  Conservative measures:  Physical therapy: has not participated in  Multimodal medical therapy including regular antiinflammatories: Tylenol, Gabapentin Injections:  08/14/2023- trochanteric injection left hip  Past Surgery:  10/25/2018- Kyphoplasty and sacroplasty  Cheryl Leonard has no symptoms of cervical myelopathy.  The symptoms are causing a significant impact on the patient's life.   Review of Systems:  A 10 point review of systems is negative, except for the pertinent positives and negatives detailed in the HPI.  Past Medical History: Past Medical History:  Diagnosis Date   Anemia    Anxiety    Arthritis    Gout   Cataracts, both eyes    Diabetic retinopathy (HCC)    NPDR OU   Diverticulitis    GERD (gastroesophageal reflux disease)    Gout    Headache    h/o migraines   History of fracture of patella     right knee   History of positive PPD    Patient always shows positive   Hyperlipidemia    Hypertension    Hypertensive retinopathy    OU   Hypothyroidism    Lichen sclerosus 12/30/2013   of vulva   Metatarsal fracture    Nausea and vomiting 04/11/2023   Neuropathy    Osteopenia    Peripheral vascular disease (HCC)    Polyneuropathy    numbness and tingling in feet and toes   Renal insufficiency    Stage 3   Sleep apnea    does not use cpap-lost weight    Type 2 diabetes mellitus, uncontrolled     Past Surgical History: Past Surgical History:  Procedure Laterality Date   ABDOMINAL HYSTERECTOMY     AMPUTATION TOE Right 05/08/2020   Procedure: AMPUTATION TOE-Right 4th Toe;  Surgeon: Lennie Barter, DPM;  Location: [facility];  Service: Podiatry;  Laterality: Right;   AMPUTATION TOE Left 04/22/2021   Procedure: AMPUTATION TOE - 4TH METARSOPHANGEAL JOINT;  Surgeon: Lennie Barter, DPM;  Location: [facility];  Service: Podiatry;  Laterality: Left;   APPENDECTOMY     BREAST REDUCTION SURGERY  2001   CATARACT EXTRACTION     CESAREAN SECTION  1976   COLONOSCOPY  03/05/2013   Nml - due for repeat 03/06/2018   COLONOSCOPY WITH PROPOFOL N/A 03/18/2019   Procedure: COLONOSCOPY WITH PROPOFOL;  Surgeon: Toledo, Ladell POUR, MD;  Location: [facility];  Service: Gastroenterology;  Laterality: N/A;   DIAGNOSTIC LAPAROSCOPY     DILATION AND CURETTAGE OF UTERUS  1989   ENDARTERECTOMY FEMORAL Bilateral 03/09/2018   Procedure: ENDARTERECTOMY FEMORAL;  Surgeon: Marea Selinda RAMAN, MD;  Location: [facility];  Service: Vascular;  Laterality: Bilateral;   ENDARTERECTOMY POPLITEAL Left 03/09/2018   Procedure: ENDARTERECTOMY POPLITEAL AND SFA;  Surgeon: Marea Selinda RAMAN, MD;  Location: [facility];  Service: Vascular;  Laterality: Left;   ESOPHAGOGASTRODUODENOSCOPY  03/05/2013   ESOPHAGOGASTRODUODENOSCOPY (EGD) WITH PROPOFOL N/A 03/18/2019   Procedure: ESOPHAGOGASTRODUODENOSCOPY (EGD) WITH PROPOFOL;  Surgeon: Toledo,  Ladell POUR, MD;  Location: [facility];  Service: Gastroenterology;  Laterality: N/A;   EYE SURGERY     Eyelid Surgery  2012   INTRAMEDULLARY (IM) NAIL INTERTROCHANTERIC Left 10/30/2015   Procedure: INTRAMEDULLARY (IM) NAIL INTERTROCHANTRIC ;  Surgeon: Ozell Flake, MD;  Location: [facility];  Service: Orthopedics;  Laterality: Left;   KYPHOPLASTY N/A 10/25/2018   Procedure: L4 KYPHOPLASTY;  Surgeon: Flake Ozell, MD;  Location: [facility];  Service: Orthopedics;  Laterality: N/A;   LAPAROSCOPIC HYSTERECTOMY  2000   total   LOWER EXTREMITY ANGIOGRAPHY Left 03/08/2017   Procedure: LOWER EXTREMITY ANGIOGRAPHY;  Surgeon: Marea Selinda RAMAN, MD;  Location: [facility];  Service: Cardiovascular;  Laterality: Left;   LOWER EXTREMITY ANGIOGRAPHY Left 10/30/2017   Procedure: LOWER EXTREMITY ANGIOGRAPHY;  Surgeon: Marea Selinda RAMAN, MD;  Location: [facility];  Service: Cardiovascular;  Laterality: Left;   LOWER EXTREMITY ANGIOGRAPHY Right 03/08/2018   Procedure: LOWER EXTREMITY ANGIOGRAPHY;  Surgeon: Marea Selinda RAMAN, MD;  Location: [facility];  Service: Cardiovascular;  Laterality: Right;   LOWER EXTREMITY ANGIOGRAPHY Left 10/01/2018   Procedure: LOWER EXTREMITY ANGIOGRAPHY;  Surgeon: Marea Selinda RAMAN, MD;  Location: [facility];  Service: Cardiovascular;  Laterality: Left;   LOWER EXTREMITY ANGIOGRAPHY Right 10/08/2018   Procedure: LOWER EXTREMITY ANGIOGRAPHY;  Surgeon: Marea Selinda RAMAN, MD;  Location: [facility];  Service: Cardiovascular;  Laterality: Right;   LOWER EXTREMITY ANGIOGRAPHY Right 05/07/2020   Procedure: Lower Extremity Angiography;  Surgeon: Marea Selinda RAMAN, MD;  Location: [facility];  Service: Cardiovascular;  Laterality: Right;   LYSIS OF ADHESION  01/25/2021   Procedure: LYSIS OF ADHESION;  Surgeon: Lane Shope, MD;  Location: [facility];  Service: General;;   PERIPHERAL VASCULAR INTERVENTION  03/08/2018   Procedure: PERIPHERAL VASCULAR  INTERVENTION;  Surgeon: Marea Selinda RAMAN, MD;  Location: [facility];  Service: Cardiovascular;;   PORTA CATH INSERTION N/A 02/17/2020   Procedure: PORTA CATH INSERTION;  Surgeon: Marea Selinda RAMAN, MD;  Location: [facility];  Service: Cardiovascular;  Laterality: N/A;   REDUCTION MAMMAPLASTY  1997   SACROPLASTY N/A 10/25/2018   Procedure: S1 SACROPLASTY;  Surgeon: Flake Ozell, MD;  Location: [facility];  Service: Orthopedics;  Laterality: N/A;    Allergies: Allergies as of 08/24/2023 - Review Complete 08/24/2023  Allergen Reaction Noted   Ace inhibitors Other (See Comments) 09/23/2019    Medications: Outpatient Encounter Medications as of 08/24/2023  Medication Sig   ALPRAZolam (XANAX) 0.25 MG tablet Take 0.25 mg by mouth daily as needed for anxiety.   amLODipine (NORVASC) 5 MG tablet Take 5 mg by mouth 2 (two) times daily.   aspirin EC 81 MG tablet Take 1 tablet (81 mg total) by mouth daily.   cholecalciferol (VITAMIN D) 1000 units tablet Take 1,000 Units by mouth 2 (two) times daily.   dapagliflozin propanediol (FARXIGA) 5 MG TABS  tablet Take 5 mg by mouth daily.   denosumab (PROLIA) 60 MG/ML SOLN injection Inject 60 mg into the skin every 6 (six) months.    escitalopram (LEXAPRO) 10 MG tablet Take 10 mg by mouth daily.   estradiol (ESTRACE) 0.1 MG/GM vaginal cream Place 1 Applicatorful vaginally daily as needed (vaginal irritation).   famotidine (PEPCID) 40 MG tablet Take 40 mg by mouth daily.   furosemide (LASIX) 20 MG tablet Take 20 mg by mouth every Wednesday.   gabapentin (NEURONTIN) 300 MG capsule Take 300 mg by mouth at bedtime as needed (pain).   levothyroxine (SYNTHROID, LEVOTHROID) 100 MCG tablet Take 100 mcg by mouth daily before breakfast.    lidocaine-prilocaine (EMLA) cream Apply 1 application topically as needed (apply prior to port a cath access).   Magnesium 500 MG TABS Take 500 mg by mouth every morning.    Multiple Vitamin (MULTIVITAMIN WITH MINERALS)  TABS tablet Take 1 tablet by mouth daily. Centrum Silver   nystatin cream (MYCOSTATIN) Apply 1 application topically daily as needed (Yeast infection).   olmesartan (BENICAR) 20 MG tablet Take 20 mg by mouth daily.   OZEMPIC, 0.25 OR 0.5 MG/DOSE, 2 MG/3ML SOPN Inject 0.5 mg into the skin once a week.   pantoprazole (PROTONIX) 40 MG tablet Take 40 mg by mouth daily.   rivaroxaban (XARELTO) 20 MG TABS tablet Take 1 tablet (20 mg total) by mouth daily with supper.   rosuvastatin (CRESTOR) 20 MG tablet Take 20 mg by mouth every morning.   sodium bicarbonate 650 MG tablet Take 650 mg by mouth 2 (two) times daily.   traMADol (ULTRAM) 50 MG tablet Take 1 tablet (50 mg total) by mouth every 12 (twelve) hours as needed for severe pain (pain score 7-10). Hold your mirtazapine if you take this medication.   TRESIBA FLEXTOUCH 200 UNIT/ML SOPN Inject 25-30 Units as directed at bedtime. Titrate according to fasting blood glucose not to exceed 50 units a day   vitamin B-12 (CYANOCOBALAMIN) 1000 MCG tablet Take 1,000 mcg by mouth daily.   vitamin C (ASCORBIC ACID) 250 MG tablet Take 250 mg by mouth daily.   vitamin E 400 UNIT capsule Take 400 Units by mouth daily.   zolpidem (AMBIEN) 10 MG tablet Take 10 mg by mouth at bedtime.   [DISCONTINUED] mirtazapine (REMERON) 15 MG tablet Take 15 mg by mouth as needed.   No facility-administered encounter medications on file as of 08/24/2023.    Social History: Social History   Tobacco Use   Smoking status: Former    Current packs/day: 0.00    Average packs/day: 1 pack/day for 20.0 years (20.0 ttl pk-yrs)    Types: Cigarettes    Start date: 03/07/1976    Quit date: 03/07/1996    Years since quitting: 27.4    Passive exposure: Past   Smokeless tobacco: Never   Tobacco comments:    started smoking at age 89 but stopped smoking in 2000  Vaping Use   Vaping status: Never Used  Substance Use Topics   Alcohol use: No    Alcohol/week: 0.0 standard drinks of alcohol    Drug use: No    Family Medical History: Family History  Problem Relation Age of Onset   Coronary artery disease Father    Heart attack Father    Coronary artery disease Mother    Heart attack Mother    Ovarian cancer Sister 44       sister had hormonal therapy for IVF txs-which increased risk  factor for ovarian cancer   Breast cancer Neg Hx     Physical Examination: Vitals:   08/24/23 1024  BP: 128/70    General: Patient is well developed, well nourished, calm, collected, and in no apparent distress. Attention to examination is appropriate.  Respiratory: Patient is breathing without any difficulty.   NEUROLOGICAL:     Awake, alert, oriented to person, place, and time.  Speech is clear and fluent. Fund of knowledge is appropriate.   Cranial Nerves: Pupils equal round and reactive to light.  Facial tone is symmetric.    No current posterior lumbar tenderness.   No abnormal lesions on exposed skin.   Strength: Side Biceps Triceps Deltoid Interossei Grip Wrist Ext. Wrist Flex.  R 5 5 5 5 5 5 5   L 5 5 5 5 5 5 5    Side Iliopsoas Quads Hamstring PF DF EHL  R 5 5 5 5 5 5   L 5 5 5 5 5 5    Reflexes are 2+ and symmetric at the biceps, brachioradialis, patella and achilles.   Hoffman's is absent.  Clonus is not present.   Bilateral upper and lower extremity sensation is intact to light touch.     No pain with IR/ER of both hips. No tenderness over GTB bilaterally.   Gait is slow.   Medical Decision Making  Imaging: Lumbar MRI dated 07/28/23:  FINDINGS: Segmentation: 5 non rib-bearing lumbar type vertebral bodies are presumed. The lowest well-formed disc space is labeled L5-S1.   Alignment: Straightening of the normal lumbar lordosis. No significant listhesis.   Vertebrae: Redemonstrated chronic compression deformity of L3 with mild height loss anteriorly. Additional sequelae of L4 compression deformity now status post kyphoplasty. The degree of height loss  at L4 is slightly increased from prior now with approximately 40% height loss, previously 25%. There is mild retropulsion at the superior endplates of L3 and L4 which is similar to prior. Schmorl's nodes at multiple levels. Vertebral body heights otherwise maintained. Degenerative endplate changes and mild discogenic edema at T11-12 and posteriorly at L1-2 and L2-3. Sequelae of cement augmentation of prior left iliac fracture.   Conus medullaris and cauda equina: Conus extends to the L1 level. Conus and cauda equina appear normal.   Paraspinal and other soft tissues: The paraspinal soft tissues are unremarkable. Cholelithiasis.   Disc levels:   T12-L1: Small disc bulge. Mild facet arthrosis. No significant spinal canal or foraminal stenosis.   L1-2: Mild disc height loss. Diffuse disc bulge with lateral recess narrowing greater on the right. Mild facet arthrosis and thickening of the ligamentum flavum. No significant spinal canal or foraminal stenosis.   L2-3: Mild disc height loss. Diffuse disc bulge and posterior osteophytes which indent the ventral thecal sac with lateral recess narrowing. Moderate facet arthrosis and thickening of the ligamentum flavum. Moderate spinal canal stenosis is similar to prior. There is moderate bilateral foraminal stenosis. Facet osteophytes possibly contact the exiting left L2 nerve root.   L3-4: Diffuse disc bulge resulting in lateral recess narrowing. Moderate facet arthrosis and thickening of the ligamentum flavum. Moderate spinal canal stenosis is increased from prior. There is mild right and moderate to severe left foraminal stenosis, increased from prior. Disc bulge and facet osteophytes abut the exiting left L3 nerve root.   L4-5: Diffuse disc bulge resulting in lateral recess narrowing. Moderate facet arthrosis and thickening of the ligamentum flavum which indents the dorsal thecal sac. There is moderate spinal canal stenosis which is  increased from prior.  Small left extraforaminal disc protrusion. There is moderate right and severe left foraminal stenosis, increased from prior. Disc bulge possibly abuts the a extraforaminal left L4 nerve root.   L5-S1: Diffuse disc bulge. Moderate facet arthrosis. Similar lateral recess narrowing without significant spinal canal stenosis. There is moderate right and mild left foraminal stenosis increased from prior.   IMPRESSION: Degenerative changes as above. 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 Similar moderate spinal canal stenosis at L2-3. Lateral recess narrowing at multiple levels.   Multilevel foraminal stenosis, greatest and severe on the left at L4-5, increased from prior.   Extraforaminal disc protrusion at L4-5 possibly abuts the extraforaminal left L4 nerve root.   Possible impingement upon the exiting left L2 and left L3 nerve roots as above.   Chronic compression deformity of L4 status post kyphoplasty. Now with 40% height loss, previously 25%.     Electronically Signed   By: Donnice Mania M.D.   On: 07/28/2023 12:30   Lumbar xrays dated 06/28/23:  FINDINGS: Regional soft tissues unremarkable. No aggressive osseus lesion. Unchanged cement augmentation at L4.  Unchanged age-indeterminate compression deformity at L3.  No new compression deformity. Unchanged multilevel disc height loss.  This is greatest at L2-3 where it is moderate Mild lower lumbar facet hypertrophy. Exam End: 06/28/23 09:53 Last Resulted: 06/28/23 14:09  Received From: [facility]  Result Received: 07/25/23 10:39   I have personally reviewed the images and agree with the above interpretation.    Assessment and Plan: Cheryl Leonard had a fall about 3 months ago and since that time she has constant LBP with left lateral leg pain to her knee along with numbness and tingling. Pain is worse with sitting and better with walking.    History of compression fracture L3 and kyphoplasty L4. 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 Left leg pain may be from severe left foraminal stenosis L4-L5.   Clinically, she looks better than her MRI.   Treatment options discussed with patient and following plan made:   - Discussed PT for lumbar spine. She declines for now.  - One time prescription for ultram  to use for severe pain.  Reviewed dosing and side effects. Her grandson is getting married this weekend and she can have this on hand.  - Referral to PMR at [facility] to discuss possible lumbar injections.  - Follow up with me in 8 weeks for recheck.   I spent a total of 35 minutes in face-to-face and non-face-to-face activities related to this patient's care today including review of outside records, review of imaging, review of symptoms, physical exam, discussion of differential diagnosis, discussion of treatment options, and documentation.   Thank you for involving me in the care of this patient.   Cheryl Boys PA-C Dept. of Neurosurgery

## 2023-08-22 ENCOUNTER — Inpatient Hospital Stay: Payer: PRIVATE HEALTH INSURANCE

## 2023-08-22 ENCOUNTER — Encounter: Payer: Self-pay | Admitting: Internal Medicine

## 2023-08-22 ENCOUNTER — Inpatient Hospital Stay (HOSPITAL_BASED_OUTPATIENT_CLINIC_OR_DEPARTMENT_OTHER): Payer: PRIVATE HEALTH INSURANCE | Admitting: Internal Medicine

## 2023-08-22 ENCOUNTER — Inpatient Hospital Stay: Payer: PRIVATE HEALTH INSURANCE | Attending: Internal Medicine

## 2023-08-22 VITALS — BP 120/56 | HR 75

## 2023-08-22 VITALS — BP 106/54 | HR 78 | Temp 98.6°F | Resp 18 | Ht 65.0 in | Wt 167.2 lb

## 2023-08-22 DIAGNOSIS — D509 Iron deficiency anemia, unspecified: Secondary | ICD-10-CM | POA: Diagnosis present

## 2023-08-22 DIAGNOSIS — D649 Anemia, unspecified: Secondary | ICD-10-CM

## 2023-08-22 DIAGNOSIS — D631 Anemia in chronic kidney disease: Secondary | ICD-10-CM

## 2023-08-22 LAB — BASIC METABOLIC PANEL WITH GFR
Anion gap: 8 (ref 5–15)
BUN: 35 mg/dL — ABNORMAL HIGH (ref 8–23)
CO2: 20 mmol/L — ABNORMAL LOW (ref 22–32)
Calcium: 8 mg/dL — ABNORMAL LOW (ref 8.9–10.3)
Chloride: 107 mmol/L (ref 98–111)
Creatinine, Ser: 1.62 mg/dL — ABNORMAL HIGH (ref 0.44–1.00)
GFR, Estimated: 32 mL/min — ABNORMAL LOW (ref >60)
Glucose, Bld: 150 mg/dL — ABNORMAL HIGH (ref 70–99)
Potassium: 4.6 mmol/L (ref 3.5–5.1)
Sodium: 135 mmol/L (ref 135–145)

## 2023-08-22 LAB — CBC WITH DIFFERENTIAL (CANCER CENTER ONLY)
Abs Immature Granulocytes: 0.03 K/uL (ref 0.00–0.07)
Basophils Absolute: 0 K/uL (ref 0.0–0.1)
Basophils Relative: 0 %
Eosinophils Absolute: 0.2 K/uL (ref 0.0–0.5)
Eosinophils Relative: 2 %
HCT: 35.1 % — ABNORMAL LOW (ref 36.0–46.0)
Hemoglobin: 11.5 g/dL — ABNORMAL LOW (ref 12.0–15.0)
Immature Granulocytes: 0 %
Lymphocytes Relative: 17 %
Lymphs Abs: 1.2 K/uL (ref 0.7–4.0)
MCH: 30.7 pg (ref 26.0–34.0)
MCHC: 32.8 g/dL (ref 30.0–36.0)
MCV: 93.6 fL (ref 80.0–100.0)
Monocytes Absolute: 1 K/uL (ref 0.1–1.0)
Monocytes Relative: 13 %
Neutro Abs: 4.8 K/uL (ref 1.7–7.7)
Neutrophils Relative %: 68 %
Platelet Count: 348 K/uL (ref 150–400)
RBC: 3.75 MIL/uL — ABNORMAL LOW (ref 3.87–5.11)
RDW: 15.1 % (ref 11.5–15.5)
WBC Count: 7.1 K/uL (ref 4.0–10.5)
nRBC: 0 % (ref 0.0–0.2)

## 2023-08-22 LAB — IRON AND TIBC
Iron: 90 ug/dL (ref 28–170)
Saturation Ratios: 24 % (ref 10.4–31.8)
TIBC: 375 ug/dL (ref 250–450)
UIBC: 285 ug/dL

## 2023-08-22 LAB — FERRITIN: Ferritin: 17 ng/mL (ref 11–307)

## 2023-08-22 MED ORDER — IRON SUCROSE 20 MG/ML IV SOLN
200.0000 mg | Freq: Once | INTRAVENOUS | Status: AC
  Administered 2023-08-22: 200 mg via INTRAVENOUS
  Filled 2023-08-22: qty 10

## 2023-08-22 NOTE — Progress Notes (Signed)
 [facility] CONSULT NOTE  Patient Care Team: Cleotilde Oneil FALCON, MD as PCP - General (Internal Medicine) Jeri Donnice Burkes, MD as Referring Physician (Gastroenterology) Rennie Cindy SAUNDERS, MD as Consulting Physician (Oncology)  CHIEF COMPLAINTS/PURPOSE OF CONSULTATION: Anemia  HEMATOLOGY HISTORY  # ANEMIA- Jan 2021- 8.8/ferritin 11 [PCP]; N-WBC/platelets? IDA vs other- EGD-2015/colonoscopy-? 2015; 2020- [Dr.Skulskie] ; capsule-2016- ? 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   HISTORY OF PRESENTING ILLNESS: Ambulating walking with a cane.  With husband.    Cheryl Leonard 78 y.o.  female anemia iron  deficiency-question CKD-III  here for follow-up.   Patient fell in July, tripped on a rug. She  hurt her back and knee, sees [facility] pain, waiting for appt with Dr. Clois neurosurgery   C/o of ongoing Fatigue. No visible blood in stool. Dyspnea with exertion. No falls. Uses rolling walker.    Review of Systems  Constitutional:  Positive for malaise/fatigue. Negative for chills, diaphoresis and fever.  HENT:  Negative for nosebleeds and sore throat.   Eyes:  Negative for double vision.  Respiratory:  Positive for shortness of breath. Negative for cough, hemoptysis, sputum production and wheezing.   Cardiovascular:  Negative for chest pain, palpitations, orthopnea and leg swelling.  Gastrointestinal:  Negative for abdominal pain, blood in stool, constipation, diarrhea, heartburn, melena, nausea and vomiting.  Genitourinary:  Negative for dysuria, frequency and urgency.  Musculoskeletal:  Positive for joint pain.  Skin: Negative.  Negative for itching and rash.  Neurological:  Negative for dizziness, tingling, focal weakness, weakness and headaches.  Endo/Heme/Allergies:  Does not  bruise/bleed easily.  Psychiatric/Behavioral:  Negative for depression. The patient is not nervous/anxious and does not have insomnia.     MEDICAL HISTORY:  Past Medical History:  Diagnosis Date   Anemia    Anxiety    Arthritis    Gout   Cataracts, both eyes    Diabetic retinopathy (HCC)    NPDR OU   Diverticulitis    GERD (gastroesophageal reflux disease)    Gout    Headache    h/o migraines   History of fracture of patella    right knee   History of positive PPD    Patient always shows positive   Hyperlipidemia    Hypertension    Hypertensive retinopathy    OU   Hypothyroidism    Lichen sclerosus 12/30/2013   of vulva   Metatarsal fracture    Nausea and vomiting 04/11/2023   Neuropathy    Osteopenia    Peripheral vascular disease (HCC)    Polyneuropathy    numbness and tingling in feet and toes   Renal insufficiency    Stage 3   Sleep apnea    does not use cpap-lost weight    Type 2 diabetes mellitus, uncontrolled     SURGICAL HISTORY: Past Surgical History:  Procedure Laterality Date   ABDOMINAL HYSTERECTOMY     AMPUTATION TOE Right 05/08/2020   Procedure: AMPUTATION TOE-Right 4th Toe;  Surgeon: Lennie Barter, DPM;  Location: [facility];  Service: Podiatry;  Laterality: Right;   AMPUTATION TOE Left 04/22/2021   Procedure: AMPUTATION TOE - 4TH METARSOPHANGEAL JOINT;  Surgeon: Lennie Barter, DPM;  Location: [facility];  Service: Podiatry;  Laterality: Left;   APPENDECTOMY     BREAST REDUCTION SURGERY  2001  CATARACT EXTRACTION     CESAREAN SECTION  1976   COLONOSCOPY  03/05/2013   Nml - due for repeat 03/06/2018   COLONOSCOPY WITH PROPOFOL  N/A 03/18/2019   Procedure: COLONOSCOPY WITH PROPOFOL ;  Surgeon: Toledo, Ladell POUR, MD;  Location: [facility];  Service: Gastroenterology;  Laterality: N/A;   DIAGNOSTIC LAPAROSCOPY     DILATION AND CURETTAGE OF UTERUS  1989   ENDARTERECTOMY FEMORAL Bilateral 03/09/2018   Procedure: ENDARTERECTOMY FEMORAL;  Surgeon:  Marea Selinda RAMAN, MD;  Location: [facility];  Service: Vascular;  Laterality: Bilateral;   ENDARTERECTOMY POPLITEAL Left 03/09/2018   Procedure: ENDARTERECTOMY POPLITEAL AND SFA;  Surgeon: Marea Selinda RAMAN, MD;  Location: [facility];  Service: Vascular;  Laterality: Left;   ESOPHAGOGASTRODUODENOSCOPY  03/05/2013   ESOPHAGOGASTRODUODENOSCOPY (EGD) WITH PROPOFOL  N/A 03/18/2019   Procedure: ESOPHAGOGASTRODUODENOSCOPY (EGD) WITH PROPOFOL ;  Surgeon: Toledo, Ladell POUR, MD;  Location: [facility];  Service: Gastroenterology;  Laterality: N/A;   EYE SURGERY     Eyelid Surgery  2012   INTRAMEDULLARY (IM) NAIL INTERTROCHANTERIC Left 10/30/2015   Procedure: INTRAMEDULLARY (IM) NAIL INTERTROCHANTRIC ;  Surgeon: Ozell Flake, MD;  Location: [facility];  Service: Orthopedics;  Laterality: Left;   KYPHOPLASTY N/A 10/25/2018   Procedure: L4 KYPHOPLASTY;  Surgeon: Flake Ozell, MD;  Location: [facility];  Service: Orthopedics;  Laterality: N/A;   LAPAROSCOPIC HYSTERECTOMY  2000   total   LOWER EXTREMITY ANGIOGRAPHY Left 03/08/2017   Procedure: LOWER EXTREMITY ANGIOGRAPHY;  Surgeon: Marea Selinda RAMAN, MD;  Location: [facility];  Service: Cardiovascular;  Laterality: Left;   LOWER EXTREMITY ANGIOGRAPHY Left 10/30/2017   Procedure: LOWER EXTREMITY ANGIOGRAPHY;  Surgeon: Marea Selinda RAMAN, MD;  Location: [facility];  Service: Cardiovascular;  Laterality: Left;   LOWER EXTREMITY ANGIOGRAPHY Right 03/08/2018   Procedure: LOWER EXTREMITY ANGIOGRAPHY;  Surgeon: Marea Selinda RAMAN, MD;  Location: [facility];  Service: Cardiovascular;  Laterality: Right;   LOWER EXTREMITY ANGIOGRAPHY Left 10/01/2018   Procedure: LOWER EXTREMITY ANGIOGRAPHY;  Surgeon: Marea Selinda RAMAN, MD;  Location: [facility];  Service: Cardiovascular;  Laterality: Left;   LOWER EXTREMITY ANGIOGRAPHY Right 10/08/2018   Procedure: LOWER EXTREMITY ANGIOGRAPHY;  Surgeon: Marea Selinda RAMAN, MD;  Location: [facility];  Service: Cardiovascular;   Laterality: Right;   LOWER EXTREMITY ANGIOGRAPHY Right 05/07/2020   Procedure: Lower Extremity Angiography;  Surgeon: Marea Selinda RAMAN, MD;  Location: [facility];  Service: Cardiovascular;  Laterality: Right;   LYSIS OF ADHESION  01/25/2021   Procedure: LYSIS OF ADHESION;  Surgeon: Lane Shope, MD;  Location: [facility];  Service: General;;   PERIPHERAL VASCULAR INTERVENTION  03/08/2018   Procedure: PERIPHERAL VASCULAR INTERVENTION;  Surgeon: Marea Selinda RAMAN, MD;  Location: [facility];  Service: Cardiovascular;;   PORTA CATH INSERTION N/A 02/17/2020   Procedure: PORTA CATH INSERTION;  Surgeon: Marea Selinda RAMAN, MD;  Location: [facility];  Service: Cardiovascular;  Laterality: N/A;   REDUCTION MAMMAPLASTY  1997   SACROPLASTY N/A 10/25/2018   Procedure: S1 SACROPLASTY;  Surgeon: Flake Ozell, MD;  Location: [facility];  Service: Orthopedics;  Laterality: N/A;    SOCIAL HISTORY: Social History   Socioeconomic History   Marital status: Married    Spouse name: [name]   Number of children: 3   Years of education: Not on file   Highest education level: Not on file  Occupational History   Occupation: Barrister's clerk  Tobacco Use   Smoking status: Former  Current packs/day: 0.00    Average packs/day: 1 pack/day for 20.0 years (20.0 ttl pk-yrs)    Types: Cigarettes    Start date: 03/07/1976    Quit date: 03/07/1996    Years since quitting: 27.4    Passive exposure: Past   Smokeless tobacco: Never   Tobacco comments:    started smoking at age 31 but stopped smoking in 2000  Vaping Use   Vaping status: Never Used  Substance and Sexual Activity   Alcohol use: No    Alcohol/week: 0.0 standard drinks of alcohol   Drug use: No   Sexual activity: Yes    Partners: Male    Birth control/protection: Surgical  Other Topics Concern   Not on file  Social History Narrative   Lives in [place]; with husband; quit > 20 years; no alcohol; used to work at [employer]  at  [employer].    Social Drivers of Corporate investment banker Strain: Low Risk  (02/03/2023)   Received from [facility]   Overall Financial Resource Strain (CARDIA)    Difficulty of Paying Living Expenses: Not hard at all  Food Insecurity: No Food Insecurity (04/07/2023)   Hunger Vital Sign    Worried About Running Out of Food in the Last Year: Never true    Ran Out of Food in the Last Year: Never true  Transportation Needs: No Transportation Needs (04/07/2023)   PRAPARE - Administrator, Civil Service (Medical): No    Lack of Transportation (Non-Medical): No  Physical Activity: Unknown (10/01/2018)   Exercise Vital Sign    Days of Exercise per Week: 2 days    Minutes of Exercise per Session: Not on file  Stress: Stress Concern Present (10/01/2018)   [employer] of Occupational Health - Occupational Stress Questionnaire    Feeling of Stress : To some extent  Social Connections: Moderately Integrated (04/07/2023)   Social Connection and Isolation Panel    Frequency of Communication with Friends and Family: More than three times a week    Frequency of Social Gatherings with Friends and Family: More than three times a week    Attends Religious Services: More than 4 times per year    Active Member of [employer] or Organizations: No    Attends Banker Meetings: Never    Marital Status: Married  Catering manager Violence: Not At Risk (04/07/2023)   Humiliation, Afraid, Rape, and Kick questionnaire    Fear of Current or Ex-Partner: No    Emotionally Abused: No    Physically Abused: No    Sexually Abused: No    FAMILY HISTORY: Family History  Problem Relation Age of Onset   Coronary artery disease Father    Heart attack Father    Coronary artery disease Mother    Heart attack Mother    Ovarian cancer Sister 42       sister had hormonal therapy for IVF txs-which increased risk factor for ovarian cancer   Breast cancer Neg Hx     ALLERGIES:  is  allergic to ace inhibitors.  MEDICATIONS:  Current Outpatient Medications  Medication Sig Dispense Refill   ALPRAZolam  (XANAX ) 0.25 MG tablet Take 0.25 mg by mouth daily as needed for anxiety.     amLODipine  (NORVASC ) 5 MG tablet Take 5 mg by mouth 2 (two) times daily.     aspirin  EC 81 MG tablet Take 1 tablet (81 mg total) by mouth daily. 30 tablet 11   cholecalciferol  (VITAMIN  D) 1000 units tablet Take 1,000 Units by mouth 2 (two) times daily.     dapagliflozin propanediol (FARXIGA) 5 MG TABS tablet Take 5 mg by mouth daily.     denosumab  (PROLIA ) 60 MG/ML SOLN injection Inject 60 mg into the skin every 6 (six) months.      escitalopram  (LEXAPRO ) 10 MG tablet Take 10 mg by mouth daily.     estradiol  (ESTRACE ) 0.1 MG/GM vaginal cream Place 1 Applicatorful vaginally daily as needed (vaginal irritation).     famotidine  (PEPCID ) 40 MG tablet Take 40 mg by mouth daily.     furosemide  (LASIX ) 20 MG tablet Take 20 mg by mouth every Wednesday.     gabapentin  (NEURONTIN ) 300 MG capsule Take 300 mg by mouth at bedtime as needed (pain).     levothyroxine  (SYNTHROID , LEVOTHROID) 100 MCG tablet Take 100 mcg by mouth daily before breakfast.   3   lidocaine -prilocaine  (EMLA ) cream Apply 1 application topically as needed (apply prior to port a cath access). 30 g 3   Magnesium  500 MG TABS Take 500 mg by mouth every morning.      mirtazapine  (REMERON ) 15 MG tablet Take 15 mg by mouth as needed.     Multiple Vitamin (MULTIVITAMIN WITH MINERALS) TABS tablet Take 1 tablet by mouth daily. Centrum Silver     nystatin  cream (MYCOSTATIN ) Apply 1 application topically daily as needed (Yeast infection).     olmesartan (BENICAR) 20 MG tablet Take 20 mg by mouth daily.     OZEMPIC, 0.25 OR 0.5 MG/DOSE, 2 MG/3ML SOPN Inject 0.5 mg into the skin once a week.     pantoprazole  (PROTONIX ) 40 MG tablet Take 40 mg by mouth daily.     rivaroxaban  (XARELTO ) 20 MG TABS tablet Take 1 tablet (20 mg total) by mouth daily with  supper. 90 tablet 3   rosuvastatin  (CRESTOR ) 20 MG tablet Take 20 mg by mouth every morning.     sodium bicarbonate  650 MG tablet Take 650 mg by mouth 2 (two) times daily.     TRESIBA FLEXTOUCH 200 UNIT/ML SOPN Inject 25-30 Units as directed at bedtime. Titrate according to fasting blood glucose not to exceed 50 units a day  5   vitamin B-12 (CYANOCOBALAMIN ) 1000 MCG tablet Take 1,000 mcg by mouth daily.     vitamin C  (ASCORBIC ACID ) 250 MG tablet Take 250 mg by mouth daily.     vitamin E  400 UNIT capsule Take 400 Units by mouth daily.     zolpidem  (AMBIEN ) 10 MG tablet Take 10 mg by mouth at bedtime.     No current facility-administered medications for this visit.      PHYSICAL EXAMINATION:   Vitals:   08/22/23 1300  BP: (!) 106/54  Pulse: 78  Resp: 18  Temp: 98.6 F (37 C)  SpO2: 96%    Filed Weights   08/22/23 1300  Weight: 167 lb 3.2 oz (75.8 kg)     Physical Exam Constitutional:      Comments: Alone.  Ambulating independently.  HENT:     Head: Normocephalic and atraumatic.     Mouth/Throat:     Pharynx: No oropharyngeal exudate.  Eyes:     Pupils: Pupils are equal, round, and reactive to light.  Cardiovascular:     Rate and Rhythm: Normal rate and regular rhythm.  Pulmonary:     Effort: Pulmonary effort is normal. No respiratory distress.     Breath sounds: Normal breath sounds. No wheezing.  Abdominal:  General: Bowel sounds are normal. There is no distension.     Palpations: Abdomen is soft. There is no mass.     Tenderness: There is no abdominal tenderness. There is no guarding or rebound.  Musculoskeletal:        General: No tenderness. Normal range of motion.     Cervical back: Normal range of motion and neck supple.  Skin:    General: Skin is warm.  Neurological:     Mental Status: She is alert and oriented to person, place, and time.  Psychiatric:        Mood and Affect: Affect normal.     LABORATORY DATA:  I have reviewed the data as  listed Lab Results  Component Value Date   WBC 7.1 08/22/2023   HGB 11.5 (L) 08/22/2023   HCT 35.1 (L) 08/22/2023   MCV 93.6 08/22/2023   PLT 348 08/22/2023   Recent Labs    04/07/23 0652 04/08/23 0459 04/10/23 0451 04/18/23 1334 08/22/23 1233  NA 133*   < > 138 136 135  K 3.5   < > 4.1 4.5 4.6  CL 99   < > 111 103 107  CO2 22   < > 19* 24 20*  GLUCOSE 187*   < > 174* 198* 150*  BUN 38*   < > 25* 24* 35*  CREATININE 2.26*   < > 1.38* 1.44* 1.62*  CALCIUM  7.7*   < > 7.5* 9.6 8.0*  GFRNONAA 22*   < > 39* 37* 32*  PROT 6.5  --   --   --   --   ALBUMIN 3.2*  --   --   --   --   AST 17  --   --   --   --   ALT 16  --   --   --   --   ALKPHOS 46  --   --   --   --   BILITOT 1.1  --   --   --   --    < > = values in this interval not displayed.     MR LUMBAR SPINE WO CONTRAST Result Date: 07/28/2023 CLINICAL DATA:  Fall 2 months ago now with left-sided leg pain. EXAM: MRI LUMBAR SPINE WITHOUT CONTRAST TECHNIQUE: Multiplanar, multisequence MR imaging of the lumbar spine was performed. No intravenous contrast was administered. COMPARISON:  MRI lumbar spine 10/21/2018. FINDINGS: Segmentation: 5 non rib-bearing lumbar type vertebral bodies are presumed. The lowest well-formed disc space is labeled L5-S1. Alignment: Straightening of the normal lumbar lordosis. No significant listhesis. Vertebrae: Redemonstrated chronic compression deformity of L3 with mild height loss anteriorly. Additional sequelae of L4 compression deformity now status post kyphoplasty. The degree of height loss at L4 is slightly increased from prior now with approximately 40% height loss, previously 25%. There is mild retropulsion at the superior endplates of L3 and L4 which is similar to prior. Schmorl's nodes at multiple levels. Vertebral body heights otherwise maintained. Degenerative endplate changes and mild discogenic edema at T11-12 and posteriorly at L1-2 and L2-3. Sequelae of cement augmentation of prior left  iliac fracture. Conus medullaris and cauda equina: Conus extends to the L1 level. Conus and cauda equina appear normal. Paraspinal and other soft tissues: The paraspinal soft tissues are unremarkable. Cholelithiasis. Disc levels: T12-L1: Small disc bulge. Mild facet arthrosis. No significant spinal canal or foraminal stenosis. L1-2: Mild disc height loss. Diffuse disc bulge with lateral recess narrowing greater on the right. Mild  facet arthrosis and thickening of the ligamentum flavum. No significant spinal canal or foraminal stenosis. L2-3: Mild disc height loss. Diffuse disc bulge and posterior osteophytes which indent the ventral thecal sac with lateral recess narrowing. Moderate facet arthrosis and thickening of the ligamentum flavum. Moderate spinal canal stenosis is similar to prior. There is moderate bilateral foraminal stenosis. Facet osteophytes possibly contact the exiting left L2 nerve root. L3-4: Diffuse disc bulge resulting in lateral recess narrowing. Moderate facet arthrosis and thickening of the ligamentum flavum. Moderate spinal canal stenosis is increased from prior. There is mild right and moderate to severe left foraminal stenosis, increased from prior. Disc bulge and facet osteophytes abut the exiting left L3 nerve root. L4-5: Diffuse disc bulge resulting in lateral recess narrowing. Moderate facet arthrosis and thickening of the ligamentum flavum which indents the dorsal thecal sac. There is moderate spinal canal stenosis which is increased from prior. Small left extraforaminal disc protrusion. There is moderate right and severe left foraminal stenosis, increased from prior. Disc bulge possibly abuts the a extraforaminal left L4 nerve root. L5-S1: Diffuse disc bulge. Moderate facet arthrosis. Similar lateral recess narrowing without significant spinal canal stenosis. There is moderate right and mild left foraminal stenosis increased from prior. IMPRESSION: Degenerative changes as above.  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 Similar moderate spinal canal stenosis at L2-3. Lateral recess narrowing at multiple levels. Multilevel foraminal stenosis, greatest and severe on the left at L4-5, increased from prior. Extraforaminal disc protrusion at L4-5 possibly abuts the extraforaminal left L4 nerve root. Possible impingement upon the exiting left L2 and left L3 nerve roots as above. Chronic compression deformity of L4 status post kyphoplasty. Now with 40% height loss, previously 25%. Electronically Signed   By: Donnice Mania M.D.   On: 07/28/2023 12:30   Intravitreal Injection, Pharmacologic Agent - OD - Right Eye Result Date: 07/26/2023 Time Out 07/26/2023. 11:02 AM. Confirmed correct patient, procedure, site, and patient consented. Anesthesia Topical anesthesia was used. Anesthetic medications included Lidocaine  2%, Proparacaine 0.5%. Procedure Preparation included 5% betadine to ocular surface, eyelid speculum. A supplied (30 g) needle was used. Injection: 6 mg faricimab -svoa 6 MG/0.05ML   Route: Intravitreal, Site: Right Eye   NDC: 49757-903-93, Lot: [id number], Expiration date: 05/02/2024, Waste: 0 mL Post-op Post injection exam found visual acuity of at least counting fingers. The patient tolerated the procedure well. There were no complications. The patient received written and verbal post procedure care education. Post injection medications were not given.   OCT, Retina - OU - Both Eyes Result Date: 07/26/2023 Right Eye Quality was borderline. Central Foveal Thickness: 351. Progression has improved. Findings include no SRF, abnormal foveal contour, epiretinal membrane, intraretinal fluid (Interval improvement in central IRF / cystic changes and foveal contour. ). Left Eye Quality was good. Central Foveal Thickness: 291. Progression has been stable. Findings include normal foveal contour, no IRF, no SRF. Notes *Images captured and stored on drive Diagnosis /  Impression: OD: Interval improvement in central IRF / cystic changes and foveal contour. OS: NFP; no IRF/SRF--stable Clinical management: See below Abbreviations: NFP - Normal foveal profile. CME - cystoid macular edema. PED - pigment epithelial detachment. IRF - intraretinal fluid. SRF - subretinal fluid. EZ - ellipsoid zone. ERM - epiretinal membrane. ORA - outer retinal atrophy. ORT - outer retinal tubulation. SRHM - subretinal hyper-reflective material   US  Venous Img Lower Unilateral Left (DVT) Result Date: 07/25/2023 CLINICAL DATA:  left leg swelling EXAM: Left LOWER  EXTREMITY VENOUS DOPPLER ULTRASOUND TECHNIQUE: Gray-scale sonography with compression, as well as color and duplex ultrasound, were performed to evaluate the deep venous system(s) from the level of the common femoral vein through the popliteal and proximal calf veins. COMPARISON:  Ultrasound lower extremity left 01/16/2018, CT abdomen pelvis 04/07/2023 FINDINGS: VENOUS Bilateral femoral stents noted. Similar-appearing associated wall thickening surrounding the stents. Normal compressibility of the common femoral, superficial femoral, and popliteal veins, as well as the visualized calf veins. Visualized portions of profunda femoral vein and great saphenous vein unremarkable. No filling defects to suggest DVT on grayscale or color Doppler imaging. Doppler waveforms show normal direction of venous flow, normal respiratory plasticity and response to augmentation. Limited views of the contralateral common femoral vein are unremarkable. OTHER None. Limitations: none IMPRESSION: 1. No deep venous thrombosis left lower extremity. 2. Bilateral femoral stents. Similar-appearing associated wall thickening surrounding the stents. Electronically Signed   By: Morgane  Naveau M.D.   On: 07/25/2023 13:42     Normocytic anemia # Anemia/likely secondary to CKD-III/iron  deficiency.  If Retacrit /iron  does not improve consider bone marrow biopsy. On gentle  iron  [iron  biglycinate; 28 mg ]1 pill a day.    #  hemoglobin 8.3 [recent sepsis] TODAY Hb 11.5- HOLD retacrit ; Proceed with venofer .   # Etiology-CKD-III--IV; overall worsened [Dr.Kolluru];- stable.   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  #Poor IV access/Mediport placement- stable.   Schedule retacrit /venofer  on separate days  # DISPOSITION:  # Venofer  today; NO retacrit .   # in 2 months- port/ labs- H&H- possible retacrit .   # follow up 4 months  MD; port; labs- cbc/bmp; iron  studies;ferritin; possible retacrit  OR venofer - Dr.B     All questions were answered. The patient knows to call the clinic with any problems, questions or concerns.    Cindy JONELLE Joe, MD 08/22/2023 1:42 PM

## 2023-08-22 NOTE — Progress Notes (Signed)
 Fell in July, tripped on a rug, hurt her back and knee, sees [facility] pain, waiting for appt with Dr. Clois neurosurgery.

## 2023-08-22 NOTE — Assessment & Plan Note (Addendum)
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  If Retacrit /iron  does not improve consider bone marrow biopsy. On gentle iron  [iron  biglycinate; 28 mg ]1 pill a day.    #  hemoglobin 8.3 [recent sepsis] TODAY Hb 11.5- HOLD retacrit ; Proceed with venofer .   # Etiology-CKD-III--IV; overall worsened [Dr.Kolluru];- stable.   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  #Poor IV access/Mediport placement- stable.   Schedule retacrit /venofer  on separate days  # DISPOSITION:  # Venofer  today; NO retacrit .   # in 2 months- port/ labs- H&H- possible retacrit .   # follow up 4 months  MD; port; labs- cbc/bmp; iron  studies;ferritin; possible retacrit  OR venofer - Dr.B

## 2023-08-23 ENCOUNTER — Ambulatory Visit (INDEPENDENT_AMBULATORY_CARE_PROVIDER_SITE_OTHER): Admitting: Ophthalmology

## 2023-08-23 ENCOUNTER — Encounter (INDEPENDENT_AMBULATORY_CARE_PROVIDER_SITE_OTHER): Payer: Self-pay | Admitting: Ophthalmology

## 2023-08-23 DIAGNOSIS — E113213 Type 2 diabetes mellitus with mild nonproliferative diabetic retinopathy with macular edema, bilateral: Secondary | ICD-10-CM

## 2023-08-23 DIAGNOSIS — Z794 Long term (current) use of insulin: Secondary | ICD-10-CM

## 2023-08-23 DIAGNOSIS — I10 Essential (primary) hypertension: Secondary | ICD-10-CM

## 2023-08-23 DIAGNOSIS — H34831 Tributary (branch) retinal vein occlusion, right eye, with macular edema: Secondary | ICD-10-CM | POA: Diagnosis not present

## 2023-08-23 DIAGNOSIS — Z7984 Long term (current) use of oral hypoglycemic drugs: Secondary | ICD-10-CM | POA: Diagnosis not present

## 2023-08-23 DIAGNOSIS — H35033 Hypertensive retinopathy, bilateral: Secondary | ICD-10-CM

## 2023-08-23 DIAGNOSIS — Z961 Presence of intraocular lens: Secondary | ICD-10-CM

## 2023-08-23 DIAGNOSIS — H35371 Puckering of macula, right eye: Secondary | ICD-10-CM

## 2023-08-23 MED ORDER — FARICIMAB-SVOA 6 MG/0.05ML IZ SOSY
6.0000 mg | PREFILLED_SYRINGE | INTRAVITREAL | Status: AC | PRN
  Administered 2023-08-23: 6 mg via INTRAVITREAL

## 2023-08-24 ENCOUNTER — Ambulatory Visit: Admitting: Orthopedic Surgery

## 2023-08-24 ENCOUNTER — Encounter: Payer: Self-pay | Admitting: Orthopedic Surgery

## 2023-08-24 VITALS — BP 128/70 | Ht 65.0 in | Wt 166.0 lb

## 2023-08-24 DIAGNOSIS — Z8781 Personal history of (healed) traumatic fracture: Secondary | ICD-10-CM | POA: Diagnosis not present

## 2023-08-24 DIAGNOSIS — M79605 Pain in left leg: Secondary | ICD-10-CM | POA: Diagnosis not present

## 2023-08-24 DIAGNOSIS — M47816 Spondylosis without myelopathy or radiculopathy, lumbar region: Secondary | ICD-10-CM

## 2023-08-24 DIAGNOSIS — M48061 Spinal stenosis, lumbar region without neurogenic claudication: Secondary | ICD-10-CM

## 2023-08-24 DIAGNOSIS — M5416 Radiculopathy, lumbar region: Secondary | ICD-10-CM

## 2023-08-24 MED ORDER — TRAMADOL HCL 50 MG PO TABS: 50.0000 mg | ORAL_TABLET | Freq: Two times a day (BID) | ORAL | 0 refills | Status: AC | PRN

## 2023-08-24 NOTE — Patient Instructions (Signed)
 It was so nice to see you today. Thank you so much for coming in.    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 I think your back and left leg pain are coming from your back.   I sent a one time prescription for tramadol  to help with severe pain. Take only as needed. If you take the tramadol , then I would not take your mirtazapine . Be careful, this can make you sleepy and/or constipated.   I want you to see physical medicine and rehab at the [facility] to discuss possible injections in your lower back. Dr. Avanell, Dr. Dodson, and their PA [place] are great and will take good care of you. They should call you to schedule an appointment or you can call them at [phone].   I will see you back in 8 weeks. Please do not hesitate to call if you have any questions or concerns. You can also message me in MyChart.   Glade Boys PA-C [phone]     The physicians and staff at [facility] are committed to providing excellent care. You may receive a survey asking for feedback about your experience at our office. We value you your feedback and appreciate you taking the time to to fill it out. The [facility] leadership team is also available to discuss your experience in person, feel free to contact us  [phone].

## 2023-08-28 ENCOUNTER — Encounter: Payer: Self-pay | Admitting: Internal Medicine

## 2023-09-11 NOTE — Progress Notes (Signed)
 [facility] - Clinic Note  09/20/2023     CHIEF COMPLAINT Patient presents for Retina Follow Up   HISTORY OF PRESENT ILLNESS: Cheryl Leonard is a 78 y.o. female who presents to the clinic today for:    HPI     Retina Follow Up   Patient presents with  CRVO/BRVO.  In right eye.  Severity is moderate.  Duration of 4 weeks.  Since onset it is stable.  I, the attending physician,  performed the HPI with the patient and updated documentation appropriately.        Comments   4 week Retina eval. Patient states she is having a harder time reading      Last edited by Valdemar Rogue, MD on 09/20/2023 12:33 PM.    Pt states   Referring physician: Cleotilde Oneil FALCON, MD [phone] [facility] [address] [facility] [place],  [place] [address]   HISTORICAL INFORMATION:   Selected notes from the MEDICAL RECORD NUMBER Referred by Dr. Estelle for concern of DME OD Lab Results  Component Value Date   HGBA1C 7.3 (H) 04/07/2023     CURRENT MEDICATIONS: No current outpatient medications on file. (Ophthalmic Drugs)   No current facility-administered medications for this visit. (Ophthalmic Drugs)   Current Outpatient Medications (Other)  Medication Sig   ALPRAZolam  (XANAX ) 0.25 MG tablet Take 0.25 mg by mouth daily as needed for anxiety.   amLODipine  (NORVASC ) 5 MG tablet Take 5 mg by mouth 2 (two) times daily.   aspirin  EC 81 MG tablet Take 1 tablet (81 mg total) by mouth daily.   cholecalciferol  (VITAMIN D ) 1000 units tablet Take 1,000 Units by mouth 2 (two) times daily.   dapagliflozin propanediol (FARXIGA) 5 MG TABS tablet Take 5 mg by mouth daily.   denosumab  (PROLIA ) 60 MG/ML SOLN injection Inject 60 mg into the skin every 6 (six) months.    escitalopram  (LEXAPRO ) 10 MG tablet Take 10 mg by mouth daily.   estradiol  (ESTRACE ) 0.1 MG/GM vaginal cream Place 1 Applicatorful vaginally daily as needed (vaginal irritation).   famotidine  (PEPCID ) 40 MG  tablet Take 40 mg by mouth daily.   furosemide  (LASIX ) 20 MG tablet Take 20 mg by mouth every Wednesday.   gabapentin  (NEURONTIN ) 300 MG capsule Take 300 mg by mouth at bedtime as needed (pain).   levothyroxine  (SYNTHROID , LEVOTHROID) 100 MCG tablet Take 100 mcg by mouth daily before breakfast.    lidocaine -prilocaine  (EMLA ) cream Apply 1 application topically as needed (apply prior to port a cath access).   Magnesium  500 MG TABS Take 500 mg by mouth every morning.    Multiple Vitamin (MULTIVITAMIN WITH MINERALS) TABS tablet Take 1 tablet by mouth daily. Centrum Silver   nystatin  cream (MYCOSTATIN ) Apply 1 application topically daily as needed (Yeast infection).   olmesartan (BENICAR) 20 MG tablet Take 20 mg by mouth daily.   OZEMPIC, 0.25 OR 0.5 MG/DOSE, 2 MG/3ML SOPN Inject 0.5 mg into the skin once a week.   pantoprazole  (PROTONIX ) 40 MG tablet Take 40 mg by mouth daily.   rivaroxaban  (XARELTO ) 20 MG TABS tablet Take 1 tablet (20 mg total) by mouth daily with supper.   rosuvastatin  (CRESTOR ) 20 MG tablet Take 20 mg by mouth every morning.   sodium bicarbonate  650 MG tablet Take 650 mg by mouth 2 (two) times daily.   traMADol  (ULTRAM ) 50 MG tablet Take 1 tablet (50 mg total) by mouth every 12 (twelve) hours as needed for severe  pain (pain score 7-10). Hold your mirtazapine  if you take this medication.   TRESIBA FLEXTOUCH 200 UNIT/ML SOPN Inject 25-30 Units as directed at bedtime. Titrate according to fasting blood glucose not to exceed 50 units a day   vitamin B-12 (CYANOCOBALAMIN ) 1000 MCG tablet Take 1,000 mcg by mouth daily.   vitamin C  (ASCORBIC ACID ) 250 MG tablet Take 250 mg by mouth daily.   vitamin E  400 UNIT capsule Take 400 Units by mouth daily.   zolpidem  (AMBIEN ) 10 MG tablet Take 10 mg by mouth at bedtime.   No current facility-administered medications for this visit. (Other)   REVIEW OF SYSTEMS: ROS   Positive for: Gastrointestinal, Musculoskeletal, Endocrine, Cardiovascular,  Eyes, Respiratory Negative for: Constitutional, Neurological, Skin, Genitourinary, HENT, Psychiatric, Allergic/Imm, Heme/Lymph Last edited by German Olam BRAVO, COT on 09/20/2023  9:21 AM.       ALLERGIES Allergies  Allergen Reactions   Ace Inhibitors Other (See Comments)    Unknown  Other Reaction(s): Intolerance   PAST MEDICAL HISTORY Past Medical History:  Diagnosis Date   Anemia    Anxiety    Arthritis    Gout   Cataracts, both eyes    Diabetic retinopathy (HCC)    NPDR OU   Diverticulitis    GERD (gastroesophageal reflux disease)    Gout    Headache    h/o migraines   History of fracture of patella    right knee   History of positive PPD    Patient always shows positive   Hyperlipidemia    Hypertension    Hypertensive retinopathy    OU   Hypothyroidism    Lichen sclerosus 12/30/2013   of vulva   Metatarsal fracture    Nausea and vomiting 04/11/2023   Neuropathy    Osteopenia    Peripheral vascular disease (HCC)    Polyneuropathy    numbness and tingling in feet and toes   Renal insufficiency    Stage 3   Sleep apnea    does not use cpap-lost weight    Type 2 diabetes mellitus, uncontrolled    Past Surgical History:  Procedure Laterality Date   ABDOMINAL HYSTERECTOMY     AMPUTATION TOE Right 05/08/2020   Procedure: AMPUTATION TOE-Right 4th Toe;  Surgeon: Lennie Barter, DPM;  Location: [facility];  Service: Podiatry;  Laterality: Right;   AMPUTATION TOE Left 04/22/2021   Procedure: AMPUTATION TOE - 4TH METARSOPHANGEAL JOINT;  Surgeon: Lennie Barter, DPM;  Location: [facility];  Service: Podiatry;  Laterality: Left;   APPENDECTOMY     BREAST REDUCTION SURGERY  2001   CATARACT EXTRACTION     CESAREAN SECTION  1976   COLONOSCOPY  03/05/2013   Nml - due for repeat 03/06/2018   COLONOSCOPY WITH PROPOFOL  N/A 03/18/2019   Procedure: COLONOSCOPY WITH PROPOFOL ;  Surgeon: Toledo, Ladell POUR, MD;  Location: [facility];  Service: Gastroenterology;  Laterality: N/A;    DIAGNOSTIC LAPAROSCOPY     DILATION AND CURETTAGE OF UTERUS  1989   ENDARTERECTOMY FEMORAL Bilateral 03/09/2018   Procedure: ENDARTERECTOMY FEMORAL;  Surgeon: Marea Selinda RAMAN, MD;  Location: [facility];  Service: Vascular;  Laterality: Bilateral;   ENDARTERECTOMY POPLITEAL Left 03/09/2018   Procedure: ENDARTERECTOMY POPLITEAL AND SFA;  Surgeon: Marea Selinda RAMAN, MD;  Location: [facility];  Service: Vascular;  Laterality: Left;   ESOPHAGOGASTRODUODENOSCOPY  03/05/2013   ESOPHAGOGASTRODUODENOSCOPY (EGD) WITH PROPOFOL  N/A 03/18/2019   Procedure: ESOPHAGOGASTRODUODENOSCOPY (EGD) WITH PROPOFOL ;  Surgeon: Toledo, Ladell POUR, MD;  Location: [facility];  Service: Gastroenterology;  Laterality: N/A;   EYE SURGERY     Eyelid Surgery  2012   INTRAMEDULLARY (IM) NAIL INTERTROCHANTERIC Left 10/30/2015   Procedure: INTRAMEDULLARY (IM) NAIL INTERTROCHANTRIC ;  Surgeon: Ozell Flake, MD;  Location: [facility];  Service: Orthopedics;  Laterality: Left;   KYPHOPLASTY N/A 10/25/2018   Procedure: L4 KYPHOPLASTY;  Surgeon: Flake Ozell, MD;  Location: [facility];  Service: Orthopedics;  Laterality: N/A;   LAPAROSCOPIC HYSTERECTOMY  2000   total   LOWER EXTREMITY ANGIOGRAPHY Left 03/08/2017   Procedure: LOWER EXTREMITY ANGIOGRAPHY;  Surgeon: Marea Selinda RAMAN, MD;  Location: [facility];  Service: Cardiovascular;  Laterality: Left;   LOWER EXTREMITY ANGIOGRAPHY Left 10/30/2017   Procedure: LOWER EXTREMITY ANGIOGRAPHY;  Surgeon: Marea Selinda RAMAN, MD;  Location: [facility];  Service: Cardiovascular;  Laterality: Left;   LOWER EXTREMITY ANGIOGRAPHY Right 03/08/2018   Procedure: LOWER EXTREMITY ANGIOGRAPHY;  Surgeon: Marea Selinda RAMAN, MD;  Location: [facility];  Service: Cardiovascular;  Laterality: Right;   LOWER EXTREMITY ANGIOGRAPHY Left 10/01/2018   Procedure: LOWER EXTREMITY ANGIOGRAPHY;  Surgeon: Marea Selinda RAMAN, MD;  Location: [facility];  Service: Cardiovascular;  Laterality: Left;   LOWER  EXTREMITY ANGIOGRAPHY Right 10/08/2018   Procedure: LOWER EXTREMITY ANGIOGRAPHY;  Surgeon: Marea Selinda RAMAN, MD;  Location: [facility];  Service: Cardiovascular;  Laterality: Right;   LOWER EXTREMITY ANGIOGRAPHY Right 05/07/2020   Procedure: Lower Extremity Angiography;  Surgeon: Marea Selinda RAMAN, MD;  Location: [facility];  Service: Cardiovascular;  Laterality: Right;   LYSIS OF ADHESION  01/25/2021   Procedure: LYSIS OF ADHESION;  Surgeon: Lane Shope, MD;  Location: [facility];  Service: General;;   PERIPHERAL VASCULAR INTERVENTION  03/08/2018   Procedure: PERIPHERAL VASCULAR INTERVENTION;  Surgeon: Marea Selinda RAMAN, MD;  Location: [facility];  Service: Cardiovascular;;   PORTA CATH INSERTION N/A 02/17/2020   Procedure: PORTA CATH INSERTION;  Surgeon: Marea Selinda RAMAN, MD;  Location: [facility];  Service: Cardiovascular;  Laterality: N/A;   REDUCTION MAMMAPLASTY  1997   SACROPLASTY N/A 10/25/2018   Procedure: S1 SACROPLASTY;  Surgeon: Flake Ozell, MD;  Location: [facility];  Service: Orthopedics;  Laterality: N/A;   FAMILY HISTORY Family History  Problem Relation Age of Onset   Coronary artery disease Father    Heart attack Father    Coronary artery disease Mother    Heart attack Mother    Ovarian cancer Sister 81       sister had hormonal therapy for IVF txs-which increased risk factor for ovarian cancer   Breast cancer Neg Hx    SOCIAL HISTORY Social History   Tobacco Use   Smoking status: Former    Current packs/day: 0.00    Average packs/day: 1 pack/day for 20.0 years (20.0 ttl pk-yrs)    Types: Cigarettes    Start date: 03/07/1976    Quit date: 03/07/1996    Years since quitting: 27.5    Passive exposure: Past   Smokeless tobacco: Never   Tobacco comments:    started smoking at age 13 but stopped smoking in 2000  Vaping Use   Vaping status: Never Used  Substance Use Topics   Alcohol use: No    Alcohol/week: 0.0 standard drinks of alcohol    Drug use: No       OPHTHALMIC EXAM: Base Eye Exam     Visual Acuity (Snellen - Linear)       Right  Left   Dist [place] 20/30 20/20   Dist ph [place] 20/25 -2          Tonometry (Tonopen, 9:23 AM)       Right Left   Pressure 21 17         Pupils       Dark Light Shape React APD   Right 3 2 Round Brisk None   Left 3 2 Round Brisk None         Visual Fields (Counting fingers)       Left Right    Full Full         Extraocular Movement       Right Left    Full, Ortho Full, Ortho         Neuro/Psych     Oriented x3: Yes   Mood/Affect: Normal         Dilation     Both eyes: 1.0% Mydriacyl, 2.5% Phenylephrine  @ 9:23 AM           Slit Lamp and Fundus Exam     External Exam       Right Left   External Normal Normal         Slit Lamp Exam       Right Left   Lids/Lashes dermatochalasis dermatochalasis   Conjunctiva/Sclera White and quiet Trace Injection   Cornea arcus; well healed cataract wound; trace Punctate epithelial erosions arcus; well healed cataract wound, trace PEE   Anterior Chamber Deep and quiet Deep and quiet   Iris Round and dilated Round and dilated   Lens PCIOL; open PC PCIOL; open PC   Anterior Vitreous syneresis, Posterior vitreous detachment, vitreous condensations inferiorly syneresis, Posterior vitreous detachment         Fundus Exam       Right Left   Disc Superior hyperemia -- stably improved, mild Pallor, vascular loops Pink and Sharp   C/D Ratio 0.6 0.5   Macula Flat, Blunted foveal reflex, +cystic changes centrally, +Epiretinal membrane, minimal MA flat; good foveal reflex, no heme or edema, small pigment clump IT to fovea   Vessels attenuated, Tortuous attenuated, Tortuous   Periphery Attached; scattered DBH -- greastest temporal periphery- improved Attached, no heme           IMAGING AND PROCEDURES  Imaging and Procedures for 04/25/17  OCT, Retina - OU - Both Eyes       Right Eye Quality was  borderline. Central Foveal Thickness: 349. Progression has worsened. Findings include no SRF, abnormal foveal contour, epiretinal membrane, intraretinal fluid (Mind interval increase in IRF / cystic changes temporal fovea).   Left Eye Quality was borderline. Central Foveal Thickness: 290. Progression has been stable. Findings include normal foveal contour, no IRF, no SRF.   Notes *Images captured and stored on drive  Diagnosis / Impression:  OD: Mild interval increase in IRF / cystic changes temporal fovea OS: NFP; no IRF/SRF--stable  Clinical management:  See below  Abbreviations: NFP - Normal foveal profile. CME - cystoid macular edema. PED - pigment epithelial detachment. IRF - intraretinal fluid. SRF - subretinal fluid. EZ - ellipsoid zone. ERM - epiretinal membrane. ORA - outer retinal atrophy. ORT - outer retinal tubulation. SRHM - subretinal hyper-reflective material      Intravitreal Injection, Pharmacologic Agent - OD - Right Eye       Time Out 09/20/2023. 10:56 AM. Confirmed correct patient, procedure, site, and patient consented.   Anesthesia Topical anesthesia was used. Anesthetic medications  included Lidocaine  2%, Proparacaine 0.5%.   Procedure Preparation included 5% betadine to ocular surface, eyelid speculum. A supplied (30 g) needle was used.   Injection: 6 mg faricimab -svoa 6 MG/0.05ML   Route: Intravitreal, Site: Right Eye   NDC: 49757-903-93, Lot: [id number], Expiration date: 10/02/2024, Waste: 0 mL   Post-op Post injection exam found visual acuity of at least counting fingers. The patient tolerated the procedure well. There were no complications. The patient received written and verbal post procedure care education. Post injection medications were not given.            ASSESSMENT/PLAN:    ICD-10-CM   1. Branch retinal vein occlusion of right eye with macular edema  H34.8310 OCT, Retina - OU - Both Eyes    Intravitreal Injection, Pharmacologic Agent -  OD - Right Eye    faricimab -svoa (VABYSMO ) 6mg /0.87mL intravitreal injection    2. Both eyes affected by mild nonproliferative diabetic retinopathy with macular edema, associated with type 2 diabetes mellitus (HCC)  Z88.6786     3. Long term (current) use of oral hypoglycemic drugs  Z79.84     4. Current use of insulin  (HCC)  Z79.4     5. Essential hypertension  I10     6. Hypertensive retinopathy of both eyes  H35.033     7. Epiretinal membrane (ERM) of right eye  H35.371     8. Pseudophakia of both eyes  Z96.1      1. BRVO w/ CME OD - by history, pt states symptoms first noticed 2 wks prior to presentation, but reports changes may have occurred prior  - initial exam with differential tortuosity of vessels (OD > OS) - FA (02.10.20) shows mild late staining / leakage in macula, staining / leakage of disc -- improving CME - differential includes DM2 (DME), hypertensive retinopathy, inflammatory etiology / uveitis - S/P IVA OD #1 (02.08.19), #2 (03.11.19), #3 (04.09.19), #4 (05.20.19), #5 (02.10.20) - gave IVA OD on 2.10.20 due to pending Eylea4U for 2020 -- resulted in increased IRF/CME - review of OCT show persistent IRF and cystic changes --  resistance to IVA  - June 2019 -- switched therapies:  ============================== - S/P IVE OD #1 (06.24.19), #2 (07.24.19), #3 (09.04.19), #4 (10.30.19),#5 (12.30.19), #6 (03.23.20), #7 (05.05.20), #8 (07.16.20), #9 (07.17.20), #10 (08.28.20), #11 (10.13.20), # 12 (11.17.20), #13 (2.8.21), #14 (03.09.21), #15 (04.13.21), #16 (05.11.21), #17 (06.17.21), #18 (07.23.21), #19 (08.30.21), #20 (10.04.21), #21 (11.08.21), #22 (12.08.21), #23 (01.31.22), #24 (02.28.22), #25 (04.01.22), #26 (06.15.22), #27 (07.13.22), #28 (08.17.22), #29 (09.21.22), #30 (10.19.22), #31 (11.16.22), #32 (12.16.22), #33 (01.13.23), #34 (02.10.23), #35 (03.15.23), #36 (04.14.23), #37 (05.15.23), #38 (06.12.23), #39 (07.10.23), #40 (08.07.23), #41 (09.06.23), #42 (10.04.23),  #43 (11.01.23) -- IVE resistance ================================ **interval increase in IRF at 5 weeks on 07.01.24 and at 6 wks on 02.28.25 (IVV)** - s/p IVV OD #1 (12.04.23), #2 (01.03.24), #3 (01.31.24), #4 (02.28.24), #5 (03.29.24), #6 (04.24.24), #7 (05.28.24), #8 (07.01.24), #9 (07.29.24), #10 (08.26.24), #11 (09.26.24), #12 (10.25.24), #13 (11.25.24), #14 (01.07.24), #15 (02.18.25), #16 (06.25.25), #17 (07.23.25), #18 (08.20.25) - s/p IVE HD OD #1 (03.25.25), #2 (04.29.25), #3 (05.27.25) - OCT today shows mild interval increase in IRF / cystic changes temporal fovea at 4 wks  - BCVA OD improved to 20/25 from 20/40   - recommend IVV OD #19 today, (09.17.25) w/ f/u in 4 weeks  - RBA of procedure discussed, questions answered  - IVV informed consent obtained and signed, 06.25.25 (OD)  - see procedure note  -  f/u in 4 weeks  -- DFE/OCT/possible injection  2-4. Mild nonproliferative diabetic retinopathy, both eyes  - s/p IVE HD #1 (03.25.25), #2 (04.29.25), #3 (05.27.25) - A1c 5.6 on 8.14.25, 7.3 on 04.04.25  - likely DME component contributing to CME OD  - OD Interval improvement in IRF / cystic changes temporal fovea   - OS with minimal diabetic retinopathy  - Eylea  HD informed consent form obtained, signed and scanned on 03.25.25  - pt approved for Eylea  HD through Medicare and supplement  - monitor OS  - f/u 4 weeks, DFE, OCT  5,6. Hypertensive retinopathy OU - stable  - as above, may have contributing to CME OD  - discussed importance of tight BP control  - monitor  7. Epiretinal membrane, right eye   - stable nasal ERM  - no indication for surgery at this time  8. Pseudophakia OU  - s/p CE/IOL OU by cataract surgeon in [facility]  - doing well  - monitor  Ophthalmic Meds Ordered this visit:  Meds ordered this encounter  Medications   faricimab -svoa (VABYSMO ) 6mg /0.20mL intravitreal injection     Return in about 4 weeks (around 10/18/2023) for BRVO OD, DFE, OCT, Possible  Injxn.   This document serves as a record of services personally performed by Redell JUDITHANN Hans, MD, PhD. It was created on their behalf by Almetta Pesa, an ophthalmic technician. The creation of this record is the provider's dictation and/or activities during the visit.    Electronically signed by: Almetta Pesa, OA, 09/20/23  12:36 PM  Redell JUDITHANN Hans, M.D., Ph.D. Diseases & Surgery of the Retina and Vitreous [facility]  I have reviewed the above documentation for accuracy and completeness, and I agree with the above. Redell JUDITHANN Hans, M.D., Ph.D. 09/20/23 12:38 PM   Abbreviations: M myopia (nearsighted); A astigmatism; H hyperopia (farsighted); P presbyopia; Mrx spectacle prescription;  CTL contact lenses; OD right eye; OS left eye; OU both eyes  XT exotropia; ET esotropia; PEK punctate epithelial keratitis; PEE punctate epithelial erosions; DES dry eye syndrome; MGD meibomian gland dysfunction; ATs artificial tears; PFAT's preservative free artificial tears; NSC nuclear sclerotic cataract; PSC posterior subcapsular cataract; ERM epi-retinal membrane; PVD posterior vitreous detachment; RD retinal detachment; DM diabetes mellitus; DR diabetic retinopathy; NPDR non-proliferative diabetic retinopathy; PDR proliferative diabetic retinopathy; CSME clinically significant macular edema; DME diabetic macular edema; dbh dot blot hemorrhages; CWS cotton wool spot; POAG primary open angle glaucoma; C/D cup-to-disc ratio; HVF humphrey visual field; GVF goldmann visual field; OCT optical coherence tomography; IOP intraocular pressure; BRVO Branch retinal vein occlusion; CRVO central retinal vein occlusion; CRAO central retinal artery occlusion; BRAO branch retinal artery occlusion; RT retinal tear; SB scleral buckle; PPV pars plana vitrectomy; VH Vitreous hemorrhage; PRP panretinal laser photocoagulation; IVK intravitreal kenalog; VMT vitreomacular traction; MH Macular hole;  NVD  neovascularization of the disc; NVE neovascularization elsewhere; AREDS age related eye disease study; ARMD age related macular degeneration; POAG primary open angle glaucoma; EBMD epithelial/anterior basement membrane dystrophy; ACIOL anterior chamber intraocular lens; IOL intraocular lens; PCIOL posterior chamber intraocular lens; Phaco/IOL phacoemulsification with intraocular lens placement; PRK photorefractive keratectomy; LASIK laser assisted in situ keratomileusis; HTN hypertension; DM diabetes mellitus; COPD chronic obstructive pulmonary disease

## 2023-09-14 ENCOUNTER — Other Ambulatory Visit (INDEPENDENT_AMBULATORY_CARE_PROVIDER_SITE_OTHER): Payer: Self-pay | Admitting: Vascular Surgery

## 2023-09-14 DIAGNOSIS — I70228 Atherosclerosis of native arteries of extremities with rest pain, other extremity: Secondary | ICD-10-CM

## 2023-09-19 ENCOUNTER — Ambulatory Visit (INDEPENDENT_AMBULATORY_CARE_PROVIDER_SITE_OTHER): Admitting: Vascular Surgery

## 2023-09-19 ENCOUNTER — Ambulatory Visit (INDEPENDENT_AMBULATORY_CARE_PROVIDER_SITE_OTHER)

## 2023-09-19 VITALS — BP 138/72 | HR 80 | Resp 17 | Ht 65.0 in | Wt 162.4 lb

## 2023-09-19 DIAGNOSIS — E1159 Type 2 diabetes mellitus with other circulatory complications: Secondary | ICD-10-CM | POA: Diagnosis not present

## 2023-09-19 DIAGNOSIS — I10 Essential (primary) hypertension: Secondary | ICD-10-CM | POA: Diagnosis not present

## 2023-09-19 DIAGNOSIS — N1832 Chronic kidney disease, stage 3b: Secondary | ICD-10-CM

## 2023-09-19 DIAGNOSIS — I70223 Atherosclerosis of native arteries of extremities with rest pain, bilateral legs: Secondary | ICD-10-CM

## 2023-09-19 DIAGNOSIS — I70228 Atherosclerosis of native arteries of extremities with rest pain, other extremity: Secondary | ICD-10-CM | POA: Diagnosis not present

## 2023-09-19 DIAGNOSIS — E782 Mixed hyperlipidemia: Secondary | ICD-10-CM

## 2023-09-19 NOTE — Progress Notes (Signed)
 MRN : [id number]  Cheryl Leonard is a 78 y.o. ([birth year]) female who presents with chief complaint of  Chief Complaint  Patient presents with   Follow-up  .  History of Present Illness: Patient returns today in follow up of her PAD.  She has undergone multiple extensive revascularizations with limb threatening ischemia but none in the past few years.  She has stopped smoking.  She is doing reasonably well today.  No significant claudication, rest pain or ulceration.  Her ABIs today showed triphasic waveforms with a right ABI of 1.09 and a left ABI of 1.01.  Current Outpatient Medications  Medication Sig Dispense Refill   ALPRAZolam  (XANAX ) 0.25 MG tablet Take 0.25 mg by mouth daily as needed for anxiety.     amLODipine  (NORVASC ) 5 MG tablet Take 5 mg by mouth 2 (two) times daily.     aspirin  EC 81 MG tablet Take 1 tablet (81 mg total) by mouth daily. 30 tablet 11   cholecalciferol  (VITAMIN D ) 1000 units tablet Take 1,000 Units by mouth 2 (two) times daily.     dapagliflozin propanediol (FARXIGA) 5 MG TABS tablet Take 5 mg by mouth daily.     denosumab  (PROLIA ) 60 MG/ML SOLN injection Inject 60 mg into the skin every 6 (six) months.      escitalopram  (LEXAPRO ) 10 MG tablet Take 10 mg by mouth daily.     estradiol  (ESTRACE ) 0.1 MG/GM vaginal cream Place 1 Applicatorful vaginally daily as needed (vaginal irritation).     famotidine  (PEPCID ) 40 MG tablet Take 40 mg by mouth daily.     furosemide  (LASIX ) 20 MG tablet Take 20 mg by mouth every Wednesday.     gabapentin  (NEURONTIN ) 300 MG capsule Take 300 mg by mouth at bedtime as needed (pain).     levothyroxine  (SYNTHROID , LEVOTHROID) 100 MCG tablet Take 100 mcg by mouth daily before breakfast.   3   lidocaine -prilocaine  (EMLA ) cream Apply 1 application topically as needed (apply prior to port a cath access). 30 g 3   Magnesium  500 MG TABS Take 500 mg by mouth every morning.      Multiple Vitamin (MULTIVITAMIN WITH MINERALS) TABS tablet  Take 1 tablet by mouth daily. Centrum Silver     nystatin  cream (MYCOSTATIN ) Apply 1 application topically daily as needed (Yeast infection).     olmesartan (BENICAR) 20 MG tablet Take 20 mg by mouth daily.     OZEMPIC, 0.25 OR 0.5 MG/DOSE, 2 MG/3ML SOPN Inject 0.5 mg into the skin once a week.     pantoprazole  (PROTONIX ) 40 MG tablet Take 40 mg by mouth daily.     rivaroxaban  (XARELTO ) 20 MG TABS tablet Take 1 tablet (20 mg total) by mouth daily with supper. 90 tablet 3   rosuvastatin  (CRESTOR ) 20 MG tablet Take 20 mg by mouth every morning.     sodium bicarbonate  650 MG tablet Take 650 mg by mouth 2 (two) times daily.     traMADol  (ULTRAM ) 50 MG tablet Take 1 tablet (50 mg total) by mouth every 12 (twelve) hours as needed for severe pain (pain score 7-10). Hold your mirtazapine  if you take this medication. 10 tablet 0   TRESIBA FLEXTOUCH 200 UNIT/ML SOPN Inject 25-30 Units as directed at bedtime. Titrate according to fasting blood glucose not to exceed 50 units a day  5   vitamin B-12 (CYANOCOBALAMIN ) 1000 MCG tablet Take 1,000 mcg by mouth daily.     vitamin C  (ASCORBIC ACID ) 250 MG  tablet Take 250 mg by mouth daily.     vitamin E  400 UNIT capsule Take 400 Units by mouth daily.     zolpidem  (AMBIEN ) 10 MG tablet Take 10 mg by mouth at bedtime.     No current facility-administered medications for this visit.    Past Medical History:  Diagnosis Date   Anemia    Anxiety    Arthritis    Gout   Cataracts, both eyes    Diabetic retinopathy (HCC)    NPDR OU   Diverticulitis    GERD (gastroesophageal reflux disease)    Gout    Headache    h/o migraines   History of fracture of patella    right knee   History of positive PPD    Patient always shows positive   Hyperlipidemia    Hypertension    Hypertensive retinopathy    OU   Hypothyroidism    Lichen sclerosus 12/30/2013   of vulva   Metatarsal fracture    Nausea and vomiting 04/11/2023   Neuropathy    Osteopenia    Peripheral  vascular disease (HCC)    Polyneuropathy    numbness and tingling in feet and toes   Renal insufficiency    Stage 3   Sleep apnea    does not use cpap-lost weight    Type 2 diabetes mellitus, uncontrolled     Past Surgical History:  Procedure Laterality Date   ABDOMINAL HYSTERECTOMY     AMPUTATION TOE Right 05/08/2020   Procedure: AMPUTATION TOE-Right 4th Toe;  Surgeon: Lennie Barter, DPM;  Location: [facility];  Service: Podiatry;  Laterality: Right;   AMPUTATION TOE Left 04/22/2021   Procedure: AMPUTATION TOE - 4TH METARSOPHANGEAL JOINT;  Surgeon: Lennie Barter, DPM;  Location: [facility];  Service: Podiatry;  Laterality: Left;   APPENDECTOMY     BREAST REDUCTION SURGERY  2001   CATARACT EXTRACTION     CESAREAN SECTION  1976   COLONOSCOPY  03/05/2013   Nml - due for repeat 03/06/2018   COLONOSCOPY WITH PROPOFOL  N/A 03/18/2019   Procedure: COLONOSCOPY WITH PROPOFOL ;  Surgeon: Toledo, Ladell POUR, MD;  Location: [facility];  Service: Gastroenterology;  Laterality: N/A;   DIAGNOSTIC LAPAROSCOPY     DILATION AND CURETTAGE OF UTERUS  1989   ENDARTERECTOMY FEMORAL Bilateral 03/09/2018   Procedure: ENDARTERECTOMY FEMORAL;  Surgeon: Marea Selinda RAMAN, MD;  Location: [facility];  Service: Vascular;  Laterality: Bilateral;   ENDARTERECTOMY POPLITEAL Left 03/09/2018   Procedure: ENDARTERECTOMY POPLITEAL AND SFA;  Surgeon: Marea Selinda RAMAN, MD;  Location: [facility];  Service: Vascular;  Laterality: Left;   ESOPHAGOGASTRODUODENOSCOPY  03/05/2013   ESOPHAGOGASTRODUODENOSCOPY (EGD) WITH PROPOFOL  N/A 03/18/2019   Procedure: ESOPHAGOGASTRODUODENOSCOPY (EGD) WITH PROPOFOL ;  Surgeon: Toledo, Ladell POUR, MD;  Location: [facility];  Service: Gastroenterology;  Laterality: N/A;   EYE SURGERY     Eyelid Surgery  2012   INTRAMEDULLARY (IM) NAIL INTERTROCHANTERIC Left 10/30/2015   Procedure: INTRAMEDULLARY (IM) NAIL INTERTROCHANTRIC ;  Surgeon: Ozell Flake, MD;  Location: [facility];  Service: Orthopedics;   Laterality: Left;   KYPHOPLASTY N/A 10/25/2018   Procedure: L4 KYPHOPLASTY;  Surgeon: Flake Ozell, MD;  Location: [facility];  Service: Orthopedics;  Laterality: N/A;   LAPAROSCOPIC HYSTERECTOMY  2000   total   LOWER EXTREMITY ANGIOGRAPHY Left 03/08/2017   Procedure: LOWER EXTREMITY ANGIOGRAPHY;  Surgeon: Marea Selinda RAMAN, MD;  Location: [facility];  Service: Cardiovascular;  Laterality: Left;   LOWER EXTREMITY ANGIOGRAPHY Left 10/30/2017  Procedure: LOWER EXTREMITY ANGIOGRAPHY;  Surgeon: Marea Selinda RAMAN, MD;  Location: [facility];  Service: Cardiovascular;  Laterality: Left;   LOWER EXTREMITY ANGIOGRAPHY Right 03/08/2018   Procedure: LOWER EXTREMITY ANGIOGRAPHY;  Surgeon: Marea Selinda RAMAN, MD;  Location: [facility];  Service: Cardiovascular;  Laterality: Right;   LOWER EXTREMITY ANGIOGRAPHY Left 10/01/2018   Procedure: LOWER EXTREMITY ANGIOGRAPHY;  Surgeon: Marea Selinda RAMAN, MD;  Location: [facility];  Service: Cardiovascular;  Laterality: Left;   LOWER EXTREMITY ANGIOGRAPHY Right 10/08/2018   Procedure: LOWER EXTREMITY ANGIOGRAPHY;  Surgeon: Marea Selinda RAMAN, MD;  Location: [facility];  Service: Cardiovascular;  Laterality: Right;   LOWER EXTREMITY ANGIOGRAPHY Right 05/07/2020   Procedure: Lower Extremity Angiography;  Surgeon: Marea Selinda RAMAN, MD;  Location: [facility];  Service: Cardiovascular;  Laterality: Right;   LYSIS OF ADHESION  01/25/2021   Procedure: LYSIS OF ADHESION;  Surgeon: Lane Shope, MD;  Location: [facility];  Service: General;;   PERIPHERAL VASCULAR INTERVENTION  03/08/2018   Procedure: PERIPHERAL VASCULAR INTERVENTION;  Surgeon: Marea Selinda RAMAN, MD;  Location: [facility];  Service: Cardiovascular;;   PORTA CATH INSERTION N/A 02/17/2020   Procedure: PORTA CATH INSERTION;  Surgeon: Marea Selinda RAMAN, MD;  Location: [facility];  Service: Cardiovascular;  Laterality: N/A;   REDUCTION MAMMAPLASTY  1997   SACROPLASTY N/A  10/25/2018   Procedure: S1 SACROPLASTY;  Surgeon: Kathlynn Sharper, MD;  Location: [facility];  Service: Orthopedics;  Laterality: N/A;     Social History   Tobacco Use   Smoking status: Former    Current packs/day: 0.00    Average packs/day: 1 pack/day for 20.0 years (20.0 ttl pk-yrs)    Types: Cigarettes    Start date: 03/07/1976    Quit date: 03/07/1996    Years since quitting: 27.5    Passive exposure: Past   Smokeless tobacco: Never   Tobacco comments:    started smoking at age 55 but stopped smoking in 2000  Vaping Use   Vaping status: Never Used  Substance Use Topics   Alcohol use: No    Alcohol/week: 0.0 standard drinks of alcohol   Drug use: No      Family History  Problem Relation Age of Onset   Coronary artery disease Father    Heart attack Father    Coronary artery disease Mother    Heart attack Mother    Ovarian cancer Sister 46       sister had hormonal therapy for IVF txs-which increased risk factor for ovarian cancer   Breast cancer Neg Hx      Allergies  Allergen Reactions   Ace Inhibitors Other (See Comments)    Unknown  Other Reaction(s): Intolerance      REVIEW OF SYSTEMS (Negative unless checked)   Constitutional: [] Weight loss  [] Fever  [] Chills Cardiac: [] Chest pain   [] Chest pressure   [] Palpitations   [] Shortness of breath when laying flat   [] Shortness of breath at rest   [x] Shortness of breath with exertion. Vascular:  [] Pain in legs with walking   [] Pain in legs at rest   [] Pain in legs when laying flat   [] Claudication   [] Pain in feet when walking  [] Pain in feet at rest  [] Pain in feet when laying flat   [] History of DVT   [] Phlebitis   [] Swelling in legs   [] Varicose veins   [] Non-healing ulcers Pulmonary:   [] Uses home oxygen   []   Productive cough   [] Hemoptysis   [] Wheeze  [] COPD   [] Asthma Neurologic:  [] Dizziness  [] Blackouts   [] Seizures   [] History of stroke   [] History of TIA  [] Aphasia   [] Temporary blindness   [] Dysphagia    [] Weakness or numbness in arms   [] Weakness or numbness in legs Musculoskeletal:  [x] Arthritis   [] Joint swelling   [x] Joint pain   [] Low back pain Hematologic:  [] Easy bruising  [] Easy bleeding   [] Hypercoagulable state   [x] Anemic   Gastrointestinal:  [] Blood in stool   [] Vomiting blood  [x] Gastroesophageal reflux/heartburn   [] Abdominal pain Genitourinary:  [x] Chronic kidney disease   [] Difficult urination  [] Frequent urination  [] Burning with urination   [] Hematuria Skin:  [] Rashes   [] Ulcers   [] Wounds Psychological:  [x] History of anxiety   []  History of major depression.  Physical Examination  BP 138/72 (BP Location: Right Arm, Patient Position: Sitting, Cuff Size: Normal)   Pulse 80   Resp 17   Ht 5' 5 (1.651 m)   Wt 162 lb 6.4 oz (73.7 kg)   BMI 27.02 kg/m  Gen:  WD/WN, NAD Head: [place]/AT, No temporalis wasting. Ear/Nose/Throat: Hearing grossly intact, nares w/o erythema or drainage Eyes: Conjunctiva clear. Sclera non-icteric Neck: Supple.  Trachea midline Pulmonary:  Good air movement, no use of accessory muscles.  Cardiac: RRR, no JVD Vascular:  Vessel Right Left  Radial Palpable Palpable                          PT Palpable Palpable  DP Palpable Palpable   Gastrointestinal: soft, non-tender/non-distended. No guarding/reflex.  Musculoskeletal: M/S 5/5 throughout.  No deformity or atrophy. No edema. Neurologic: Sensation grossly intact in extremities.  Symmetrical.  Speech is fluent.  Psychiatric: Judgment intact, Mood & affect appropriate for pt's clinical situation. Dermatologic: No rashes or ulcers noted.  No cellulitis or open wounds.      Labs Recent Results (from the past 2160 hours)  Ferritin     Status: None   Collection Time: 08/22/23 12:33 PM  Result Value Ref Range   Ferritin 17 11 - 307 ng/mL    Comment: Performed at [facility], [address] [place], [place] [address]  Iron  and TIBC     Status: None   Collection Time: 08/22/23  12:33 PM  Result Value Ref Range   Iron  90 28 - 170 ug/dL   TIBC 624 749 - 549 ug/dL   Saturation Ratios 24 10.4 - 31.8 %   UIBC 285 ug/dL    Comment: Performed at [facility], [address] [place], [place] [address]  Basic metabolic panel with GFR     Status: Abnormal   Collection Time: 08/22/23 12:33 PM  Result Value Ref Range   Sodium 135 135 - 145 mmol/L   Potassium 4.6 3.5 - 5.1 mmol/L   Chloride 107 98 - 111 mmol/L   CO2 20 (L) 22 - 32 mmol/L   Glucose, Bld 150 (H) 70 - 99 mg/dL    Comment: Glucose reference range applies only to samples taken after fasting for at least 8 hours.   BUN 35 (H) 8 - 23 mg/dL   Creatinine, Ser 8.37 (H) 0.44 - 1.00 mg/dL   Calcium  8.0 (L) 8.9 - 10.3 mg/dL   GFR, Estimated 32 (L) >60 mL/min    Comment: (NOTE) Calculated using the CKD-EPI Creatinine Equation (2021)    Anion gap 8 5 - 15  Comment: Performed at [facility], [address] [place], [place] [address]  CBC with Differential (Cancer Center Only)     Status: Abnormal   Collection Time: 08/22/23 12:33 PM  Result Value Ref Range   WBC Count 7.1 4.0 - 10.5 K/uL   RBC 3.75 (L) 3.87 - 5.11 MIL/uL   Hemoglobin 11.5 (L) 12.0 - 15.0 g/dL   HCT 64.8 (L) 63.9 - 53.9 %   MCV 93.6 80.0 - 100.0 fL   MCH 30.7 26.0 - 34.0 pg   MCHC 32.8 30.0 - 36.0 g/dL   RDW 84.8 88.4 - 84.4 %   Platelet Count 348 150 - 400 K/uL   nRBC 0.0 0.0 - 0.2 %   Neutrophils Relative % 68 %   Neutro Abs 4.8 1.7 - 7.7 K/uL   Lymphocytes Relative 17 %   Lymphs Abs 1.2 0.7 - 4.0 K/uL   Monocytes Relative 13 %   Monocytes Absolute 1.0 0.1 - 1.0 K/uL   Eosinophils Relative 2 %   Eosinophils Absolute 0.2 0.0 - 0.5 K/uL   Basophils Relative 0 %   Basophils Absolute 0.0 0.0 - 0.1 K/uL   Immature Granulocytes 0 %   Abs Immature Granulocytes 0.03 0.00 - 0.07 K/uL    Comment: Performed at [facility], [address] [place], [place] [address]    Radiology Intravitreal Injection,  Pharmacologic Agent - OD - Right Eye Result Date: 08/23/2023 Time Out 08/23/2023. 2:55 PM. Confirmed correct patient, procedure, site, and patient consented. Anesthesia Topical anesthesia was used. Anesthetic medications included Lidocaine  2%, Proparacaine 0.5%. Procedure Preparation included 5% betadine to ocular surface, eyelid speculum. A supplied (30 g) needle was used. Injection: 6 mg faricimab -svoa 6 MG/0.05ML   Route: Intravitreal, Site: Right Eye   NDC: 49757-903-93, Lot: [id number], Expiration date: 08/01/2024, Waste: 0 mL Post-op Post injection exam found visual acuity of at least counting fingers. The patient tolerated the procedure well. There were no complications. The patient received written and verbal post procedure care education. Post injection medications were not given.   OCT, Retina - OU - Both Eyes Result Date: 08/23/2023 Right Eye Quality was borderline. Central Foveal Thickness: 329. Progression has improved. Findings include no IRF, no SRF, abnormal foveal contour, epiretinal membrane (Interval improvement in IRF / cystic changes temporal fovea). Left Eye Quality was good. Central Foveal Thickness: 285. Progression has been stable. Findings include normal foveal contour, no IRF, no SRF. Notes *Images captured and stored on drive Diagnosis / Impression: OD: Interval improvement in IRF / cystic changes temporal fovea OS: NFP; no IRF/SRF--stable Clinical management: See below Abbreviations: NFP - Normal foveal profile. CME - cystoid macular edema. PED - pigment epithelial detachment. IRF - intraretinal fluid. SRF - subretinal fluid. EZ - ellipsoid zone. ERM - epiretinal membrane. ORA - outer retinal atrophy. ORT - outer retinal tubulation. SRHM - subretinal hyper-reflective material    Assessment/Plan  Benign essential hypertension Her ABIs today showed triphasic waveforms with a right ABI of 1.09 and a left ABI of 1.01. no changes. High risk patient so continue meds as current and 6  month follow up.   Benign essential hypertension blood pressure control important in reducing the progression of atherosclerotic disease. On appropriate oral medications.     Type 2 diabetes mellitus with vascular disease (HCC) blood glucose control important in reducing the progression of atherosclerotic disease. Also, involved in wound healing. On appropriate medications.     Stage 3b chronic kidney disease (HCC) Avoid contrast unless  absolutely necessary.   Hyperlipidemia, mixed lipid control important in reducing the progression of atherosclerotic disease. Continue statin therapy  Selinda Gu, MD  09/19/2023 1:23 PM    This note was created with Dragon medical transcription system.  Any errors from dictation are purely unintentional

## 2023-09-19 NOTE — Assessment & Plan Note (Signed)
 Her ABIs today showed triphasic waveforms with a right ABI of 1.09 and a left ABI of 1.01. no changes. High risk patient so continue meds as current and 6 month follow up.

## 2023-09-20 ENCOUNTER — Encounter (INDEPENDENT_AMBULATORY_CARE_PROVIDER_SITE_OTHER): Payer: Self-pay | Admitting: Ophthalmology

## 2023-09-20 ENCOUNTER — Ambulatory Visit (INDEPENDENT_AMBULATORY_CARE_PROVIDER_SITE_OTHER): Admitting: Ophthalmology

## 2023-09-20 DIAGNOSIS — I10 Essential (primary) hypertension: Secondary | ICD-10-CM

## 2023-09-20 DIAGNOSIS — Z7984 Long term (current) use of oral hypoglycemic drugs: Secondary | ICD-10-CM | POA: Diagnosis not present

## 2023-09-20 DIAGNOSIS — Z961 Presence of intraocular lens: Secondary | ICD-10-CM

## 2023-09-20 DIAGNOSIS — H35033 Hypertensive retinopathy, bilateral: Secondary | ICD-10-CM

## 2023-09-20 DIAGNOSIS — H34831 Tributary (branch) retinal vein occlusion, right eye, with macular edema: Secondary | ICD-10-CM

## 2023-09-20 DIAGNOSIS — Z794 Long term (current) use of insulin: Secondary | ICD-10-CM | POA: Diagnosis not present

## 2023-09-20 DIAGNOSIS — E113213 Type 2 diabetes mellitus with mild nonproliferative diabetic retinopathy with macular edema, bilateral: Secondary | ICD-10-CM | POA: Diagnosis not present

## 2023-09-20 DIAGNOSIS — H35371 Puckering of macula, right eye: Secondary | ICD-10-CM

## 2023-09-20 LAB — VAS US ABI WITH/WO TBI
Left ABI: 1.01
Right ABI: 1.09

## 2023-09-20 MED ORDER — FARICIMAB-SVOA 6 MG/0.05ML IZ SOSY
6.0000 mg | PREFILLED_SYRINGE | INTRAVITREAL | Status: AC | PRN
  Administered 2023-09-20: 6 mg via INTRAVITREAL

## 2023-10-11 NOTE — Progress Notes (Signed)
 [facility] - Clinic Note  10/18/2023     CHIEF COMPLAINT Patient presents for Retina Follow Up   HISTORY OF PRESENT ILLNESS: Cheryl Leonard is a 78 y.o. female who presents to the clinic today for:    HPI     Retina Follow Up   Patient presents with  CRVO/BRVO.  In right eye.  Severity is moderate.  Duration of 4 weeks.  Since onset it is stable.  I, the attending physician,  performed the HPI with the patient and updated documentation appropriately.        Comments   4 week Retina eval. Patient states no vision changes noticed      Last edited by Valdemar Rogue, MD on 10/18/2023 11:48 PM.     Pt states she's doing well, no changes vision wise. No new health issues.   Referring physician: Cleotilde Oneil FALCON, MD [phone] [facility] [address] [facility] [place],  [place] [address]   HISTORICAL INFORMATION:   Selected notes from the MEDICAL RECORD NUMBER Referred by Dr. Estelle for concern of DME OD Lab Results  Component Value Date   HGBA1C 7.3 (H) 04/07/2023     CURRENT MEDICATIONS: No current outpatient medications on file. (Ophthalmic Drugs)   No current facility-administered medications for this visit. (Ophthalmic Drugs)   Current Outpatient Medications (Other)  Medication Sig   ALPRAZolam  (XANAX ) 0.25 MG tablet Take 0.25 mg by mouth daily as needed for anxiety.   amLODipine  (NORVASC ) 5 MG tablet Take 5 mg by mouth 2 (two) times daily.   aspirin  EC 81 MG tablet Take 1 tablet (81 mg total) by mouth daily.   cholecalciferol  (VITAMIN D ) 1000 units tablet Take 1,000 Units by mouth 2 (two) times daily.   dapagliflozin propanediol (FARXIGA) 5 MG TABS tablet Take 5 mg by mouth daily.   denosumab  (PROLIA ) 60 MG/ML SOLN injection Inject 60 mg into the skin every 6 (six) months.    escitalopram  (LEXAPRO ) 10 MG tablet Take 10 mg by mouth daily.   estradiol  (ESTRACE ) 0.1 MG/GM vaginal cream Place 1 Applicatorful vaginally daily as  needed (vaginal irritation).   famotidine  (PEPCID ) 40 MG tablet Take 40 mg by mouth daily.   furosemide  (LASIX ) 20 MG tablet Take 20 mg by mouth every Wednesday.   gabapentin  (NEURONTIN ) 300 MG capsule Take 300 mg by mouth at bedtime as needed (pain).   levothyroxine  (SYNTHROID , LEVOTHROID) 100 MCG tablet Take 100 mcg by mouth daily before breakfast.    lidocaine -prilocaine  (EMLA ) cream Apply 1 application topically as needed (apply prior to port a cath access).   Magnesium  500 MG TABS Take 500 mg by mouth every morning.    Multiple Vitamin (MULTIVITAMIN WITH MINERALS) TABS tablet Take 1 tablet by mouth daily. Centrum Silver   nystatin  cream (MYCOSTATIN ) Apply 1 application topically daily as needed (Yeast infection).   olmesartan (BENICAR) 20 MG tablet Take 20 mg by mouth daily.   OZEMPIC, 0.25 OR 0.5 MG/DOSE, 2 MG/3ML SOPN Inject 0.5 mg into the skin once a week.   pantoprazole  (PROTONIX ) 40 MG tablet Take 40 mg by mouth daily.   rivaroxaban  (XARELTO ) 20 MG TABS tablet Take 1 tablet (20 mg total) by mouth daily with supper.   rosuvastatin  (CRESTOR ) 20 MG tablet Take 20 mg by mouth every morning.   sodium bicarbonate  650 MG tablet Take 650 mg by mouth 2 (two) times daily.   traMADol  (ULTRAM ) 50 MG tablet Take 1 tablet (50 mg total) by  mouth every 12 (twelve) hours as needed for severe pain (pain score 7-10). Hold your mirtazapine  if you take this medication.   TRESIBA FLEXTOUCH 200 UNIT/ML SOPN Inject 25-30 Units as directed at bedtime. Titrate according to fasting blood glucose not to exceed 50 units a day   vitamin B-12 (CYANOCOBALAMIN ) 1000 MCG tablet Take 1,000 mcg by mouth daily.   vitamin C  (ASCORBIC ACID ) 250 MG tablet Take 250 mg by mouth daily.   vitamin E  400 UNIT capsule Take 400 Units by mouth daily.   zolpidem  (AMBIEN ) 10 MG tablet Take 10 mg by mouth at bedtime.   No current facility-administered medications for this visit. (Other)   REVIEW OF SYSTEMS: ROS   Positive for:  Gastrointestinal, Musculoskeletal, Endocrine, Cardiovascular, Eyes, Respiratory Negative for: Constitutional, Neurological, Skin, Genitourinary, HENT, Psychiatric, Allergic/Imm, Heme/Lymph Last edited by German Olam BRAVO, COT on 10/18/2023  1:31 PM.        ALLERGIES Allergies  Allergen Reactions   Ace Inhibitors Other (See Comments)    Unknown  Other Reaction(s): Intolerance   PAST MEDICAL HISTORY Past Medical History:  Diagnosis Date   Anemia    Anxiety    Arthritis    Gout   Cataracts, both eyes    Diabetic retinopathy (HCC)    NPDR OU   Diverticulitis    GERD (gastroesophageal reflux disease)    Gout    Headache    h/o migraines   History of fracture of patella    right knee   History of positive PPD    Patient always shows positive   Hyperlipidemia    Hypertension    Hypertensive retinopathy    OU   Hypothyroidism    Lichen sclerosus 12/30/2013   of vulva   Metatarsal fracture    Nausea and vomiting 04/11/2023   Neuropathy    Osteopenia    Peripheral vascular disease    Polyneuropathy    numbness and tingling in feet and toes   Renal insufficiency    Stage 3   Sleep apnea    does not use cpap-lost weight    Type 2 diabetes mellitus, uncontrolled    Past Surgical History:  Procedure Laterality Date   ABDOMINAL HYSTERECTOMY     AMPUTATION TOE Right 05/08/2020   Procedure: AMPUTATION TOE-Right 4th Toe;  Surgeon: Lennie Barter, DPM;  Location: [facility];  Service: Podiatry;  Laterality: Right;   AMPUTATION TOE Left 04/22/2021   Procedure: AMPUTATION TOE - 4TH METARSOPHANGEAL JOINT;  Surgeon: Lennie Barter, DPM;  Location: [facility];  Service: Podiatry;  Laterality: Left;   APPENDECTOMY     BREAST REDUCTION SURGERY  2001   CATARACT EXTRACTION     CESAREAN SECTION  1976   COLONOSCOPY  03/05/2013   Nml - due for repeat 03/06/2018   COLONOSCOPY WITH PROPOFOL  N/A 03/18/2019   Procedure: COLONOSCOPY WITH PROPOFOL ;  Surgeon: Toledo, Ladell POUR, MD;  Location:  [facility];  Service: Gastroenterology;  Laterality: N/A;   DIAGNOSTIC LAPAROSCOPY     DILATION AND CURETTAGE OF UTERUS  1989   ENDARTERECTOMY FEMORAL Bilateral 03/09/2018   Procedure: ENDARTERECTOMY FEMORAL;  Surgeon: Marea Selinda RAMAN, MD;  Location: [facility];  Service: Vascular;  Laterality: Bilateral;   ENDARTERECTOMY POPLITEAL Left 03/09/2018   Procedure: ENDARTERECTOMY POPLITEAL AND SFA;  Surgeon: Marea Selinda RAMAN, MD;  Location: [facility];  Service: Vascular;  Laterality: Left;   ESOPHAGOGASTRODUODENOSCOPY  03/05/2013   ESOPHAGOGASTRODUODENOSCOPY (EGD) WITH PROPOFOL  N/A 03/18/2019   Procedure: ESOPHAGOGASTRODUODENOSCOPY (EGD) WITH PROPOFOL ;  Surgeon:  Toledo, Ladell POUR, MD;  Location: [facility];  Service: Gastroenterology;  Laterality: N/A;   EYE SURGERY     Eyelid Surgery  2012   INTRAMEDULLARY (IM) NAIL INTERTROCHANTERIC Left 10/30/2015   Procedure: INTRAMEDULLARY (IM) NAIL INTERTROCHANTRIC ;  Surgeon: Ozell Flake, MD;  Location: [facility];  Service: Orthopedics;  Laterality: Left;   KYPHOPLASTY N/A 10/25/2018   Procedure: L4 KYPHOPLASTY;  Surgeon: Flake Ozell, MD;  Location: [facility];  Service: Orthopedics;  Laterality: N/A;   LAPAROSCOPIC HYSTERECTOMY  2000   total   LOWER EXTREMITY ANGIOGRAPHY Left 03/08/2017   Procedure: LOWER EXTREMITY ANGIOGRAPHY;  Surgeon: Marea Selinda RAMAN, MD;  Location: [facility];  Service: Cardiovascular;  Laterality: Left;   LOWER EXTREMITY ANGIOGRAPHY Left 10/30/2017   Procedure: LOWER EXTREMITY ANGIOGRAPHY;  Surgeon: Marea Selinda RAMAN, MD;  Location: [facility];  Service: Cardiovascular;  Laterality: Left;   LOWER EXTREMITY ANGIOGRAPHY Right 03/08/2018   Procedure: LOWER EXTREMITY ANGIOGRAPHY;  Surgeon: Marea Selinda RAMAN, MD;  Location: [facility];  Service: Cardiovascular;  Laterality: Right;   LOWER EXTREMITY ANGIOGRAPHY Left 10/01/2018   Procedure: LOWER EXTREMITY ANGIOGRAPHY;  Surgeon: Marea Selinda RAMAN, MD;  Location: [facility];  Service: Cardiovascular;  Laterality: Left;   LOWER EXTREMITY ANGIOGRAPHY Right 10/08/2018   Procedure: LOWER EXTREMITY ANGIOGRAPHY;  Surgeon: Marea Selinda RAMAN, MD;  Location: [facility];  Service: Cardiovascular;  Laterality: Right;   LOWER EXTREMITY ANGIOGRAPHY Right 05/07/2020   Procedure: Lower Extremity Angiography;  Surgeon: Marea Selinda RAMAN, MD;  Location: [facility];  Service: Cardiovascular;  Laterality: Right;   LYSIS OF ADHESION  01/25/2021   Procedure: LYSIS OF ADHESION;  Surgeon: Lane Shope, MD;  Location: [facility];  Service: General;;   PERIPHERAL VASCULAR INTERVENTION  03/08/2018   Procedure: PERIPHERAL VASCULAR INTERVENTION;  Surgeon: Marea Selinda RAMAN, MD;  Location: [facility];  Service: Cardiovascular;;   PORTA CATH INSERTION N/A 02/17/2020   Procedure: PORTA CATH INSERTION;  Surgeon: Marea Selinda RAMAN, MD;  Location: [facility];  Service: Cardiovascular;  Laterality: N/A;   REDUCTION MAMMAPLASTY  1997   SACROPLASTY N/A 10/25/2018   Procedure: S1 SACROPLASTY;  Surgeon: Flake Ozell, MD;  Location: [facility];  Service: Orthopedics;  Laterality: N/A;   FAMILY HISTORY Family History  Problem Relation Age of Onset   Coronary artery disease Father    Heart attack Father    Coronary artery disease Mother    Heart attack Mother    Ovarian cancer Sister 27       sister had hormonal therapy for IVF txs-which increased risk factor for ovarian cancer   Breast cancer Neg Hx    SOCIAL HISTORY Social History   Tobacco Use   Smoking status: Former    Current packs/day: 0.00    Average packs/day: 1 pack/day for 20.0 years (20.0 ttl pk-yrs)    Types: Cigarettes    Start date: 03/07/1976    Quit date: 03/07/1996    Years since quitting: 27.6    Passive exposure: Past   Smokeless tobacco: Never   Tobacco comments:    started smoking at age 70 but stopped smoking in 2000  Vaping Use   Vaping status: Never Used  Substance Use Topics   Alcohol  use: No    Alcohol/week: 0.0 standard drinks of alcohol   Drug use: No       OPHTHALMIC EXAM: Base Eye Exam     Visual Acuity (Snellen -  Linear)       Right Left   Dist [place] 20/25 20/20 -1   Dist ph [place] 20/Ni          Tonometry (Tonopen, 1:36 PM)       Right Left   Pressure 12 9         Pupils       Dark Light Shape React APD   Right 3 2 Round Brisk None   Left 3 2 Round Brisk None         Visual Fields (Counting fingers)       Left Right    Full Full         Extraocular Movement       Right Left    Full, Ortho Full, Ortho         Neuro/Psych     Oriented x3: Yes   Mood/Affect: Normal         Dilation     Both eyes: 1.0% Mydriacyl, 2.5% Phenylephrine  @ 1:37 PM           Slit Lamp and Fundus Exam     External Exam       Right Left   External Normal Normal         Slit Lamp Exam       Right Left   Lids/Lashes dermatochalasis dermatochalasis   Conjunctiva/Sclera White and quiet Trace Injection   Cornea arcus; well healed cataract wound; trace Punctate epithelial erosions arcus; well healed cataract wound, trace PEE   Anterior Chamber Deep and quiet Deep and quiet   Iris Round and dilated Round and dilated   Lens PCIOL; open PC PCIOL; open PC   Anterior Vitreous syneresis, Posterior vitreous detachment, vitreous condensations inferiorly syneresis, Posterior vitreous detachment         Fundus Exam       Right Left   Disc Superior hyperemia -- stably improved, mild Pallor, vascular loops Pink and Sharp   C/D Ratio 0.6 0.5   Macula Flat, Blunted foveal reflex, +cystic changes centrally--slightly improved, +Epiretinal membrane, minimal MA flat; good foveal reflex, no heme or edema, small pigment clump IT to fovea   Vessels attenuated, Tortuous attenuated, Tortuous   Periphery Attached; scattered DBH -- greastest temporal periphery- improved Attached, no heme           IMAGING AND PROCEDURES  Imaging and Procedures for  04/25/17  OCT, Retina - OU - Both Eyes       Right Eye Quality was borderline. Central Foveal Thickness: 336. Progression has improved. Findings include no SRF, abnormal foveal contour, epiretinal membrane, intraretinal fluid (Mind interval improvement in IRF / cystic changes temporal fovea).   Left Eye Quality was borderline. Central Foveal Thickness: 294. Progression has been stable. Findings include normal foveal contour, no IRF, no SRF.   Notes *Images captured and stored on drive  Diagnosis / Impression:  OD: Mild interval improvement in IRF / cystic changes temporal fovea OS: NFP; no IRF/SRF--stable  Clinical management:  See below  Abbreviations: NFP - Normal foveal profile. CME - cystoid macular edema. PED - pigment epithelial detachment. IRF - intraretinal fluid. SRF - subretinal fluid. EZ - ellipsoid zone. ERM - epiretinal membrane. ORA - outer retinal atrophy. ORT - outer retinal tubulation. SRHM - subretinal hyper-reflective material      Intravitreal Injection, Pharmacologic Agent - OD - Right Eye       Time Out 10/18/2023. 1:37 PM. Confirmed correct patient, procedure, site, and patient consented.  Anesthesia Topical anesthesia was used. Anesthetic medications included Lidocaine  2%, Proparacaine 0.5%.   Procedure Preparation included 5% betadine to ocular surface, eyelid speculum. A supplied (30 g) needle was used.   Injection: 6 mg faricimab -svoa 6 MG/0.05ML   Route: Intravitreal, Site: Right Eye   NDC: 49757-903-93, Lot: [id number], Expiration date: 01/01/2025, Waste: 0 mL   Post-op Post injection exam found visual acuity of at least counting fingers. The patient tolerated the procedure well. There were no complications. The patient received written and verbal post procedure care education. Post injection medications were not given.             ASSESSMENT/PLAN:    ICD-10-CM   1. Branch retinal vein occlusion of right eye with macular edema (HCC)   H34.8310 OCT, Retina - OU - Both Eyes    Intravitreal Injection, Pharmacologic Agent - OD - Right Eye    faricimab -svoa (VABYSMO ) 6mg /0.53mL intravitreal injection    2. Both eyes affected by mild nonproliferative diabetic retinopathy with macular edema, associated with type 2 diabetes mellitus (HCC)  Z88.6786     3. Long term (current) use of oral hypoglycemic drugs  Z79.84     4. Current use of insulin  (HCC)  Z79.4     5. Essential hypertension  I10     6. Hypertensive retinopathy of both eyes  H35.033     7. Epiretinal membrane (ERM) of right eye  H35.371     8. Pseudophakia of both eyes  Z96.1       1. BRVO w/ CME OD - by history, pt states symptoms first noticed 2 wks prior to presentation, but reports changes may have occurred prior  - initial exam with differential tortuosity of vessels (OD > OS) - FA (02.10.20) shows mild late staining / leakage in macula, staining / leakage of disc -- improving CME - differential includes DM2 (DME), hypertensive retinopathy, inflammatory etiology / uveitis - S/P IVA OD #1 (02.08.19), #2 (03.11.19), #3 (04.09.19), #4 (05.20.19), #5 (02.10.20) - gave IVA OD on 2.10.20 due to pending Eylea4U for 2020 -- resulted in increased IRF/CME - review of OCT show persistent IRF and cystic changes --  resistance to IVA  - June 2019 -- switched therapies:  ============================== - S/P IVE OD #1 (06.24.19), #2 (07.24.19), #3 (09.04.19), #4 (10.30.19),#5 (12.30.19), #6 (03.23.20), #7 (05.05.20), #8 (07.16.20), #9 (07.17.20), #10 (08.28.20), #11 (10.13.20), # 12 (11.17.20), #13 (2.8.21), #14 (03.09.21), #15 (04.13.21), #16 (05.11.21), #17 (06.17.21), #18 (07.23.21), #19 (08.30.21), #20 (10.04.21), #21 (11.08.21), #22 (12.08.21), #23 (01.31.22), #24 (02.28.22), #25 (04.01.22), #26 (06.15.22), #27 (07.13.22), #28 (08.17.22), #29 (09.21.22), #30 (10.19.22), #31 (11.16.22), #32 (12.16.22), #33 (01.13.23), #34 (02.10.23), #35 (03.15.23), #36 (04.14.23), #37  (05.15.23), #38 (06.12.23), #39 (07.10.23), #40 (08.07.23), #41 (09.06.23), #42 (10.04.23), #43 (11.01.23) -- IVE resistance ================================ **interval increase in IRF at 5 weeks on 07.01.24 and at 6 wks on 02.28.25 (IVV)** - s/p IVV OD #1 (12.04.23), #2 (01.03.24), #3 (01.31.24), #4 (02.28.24), #5 (03.29.24), #6 (04.24.24), #7 (05.28.24), #8 (07.01.24), #9 (07.29.24), #10 (08.26.24), #11 (09.26.24), #12 (10.25.24), #13 (11.25.24), #14 (01.07.24), #15 (02.18.25), #16 (06.25.25), #17 (07.23.25), #18 (08.20.25), #19 (09.17.25) - s/p IVE HD OD #1 (03.25.25), #2 (04.29.25), #3 (05.27.25) - OCT today shows mild interval improvement in IRF / cystic changes temporal fovea at 4 wks  - BCVA OD 20/25 - stable  - recommend IVV OD #20 today, (10.15.25) w/ f/u in 4 weeks  - RBA of procedure discussed, questions answered  - IVV informed consent obtained and signed,  06.25.25 (OD)  - see procedure note  - f/u in 4 weeks  -- DFE/OCT/possible injection  2-4. Mild nonproliferative diabetic retinopathy, both eyes  - s/p IVE HD #1 (03.25.25), #2 (04.29.25), #3 (05.27.25) - A1c 5.6 on 8.14.25, 7.3 on 04.04.25  - likely DME component contributing to CME OD  - OD Interval improvement in IRF / cystic changes temporal fovea   - OS with minimal diabetic retinopathy  - Eylea  HD informed consent form obtained, signed and scanned on 03.25.25  - pt approved for Eylea  HD through Medicare and supplement  - monitor OS  - f/u 4 weeks, DFE, OCT  5,6. Hypertensive retinopathy OU - stable  - as above, may have contributing to CME OD  - discussed importance of tight BP control  - monitor  7. Epiretinal membrane, right eye   - stable nasal ERM  - no indication for surgery at this time  8. Pseudophakia OU  - s/p CE/IOL OU by cataract surgeon in [facility]  - doing well  - monitor  Ophthalmic Meds Ordered this visit:  Meds ordered this encounter  Medications   faricimab -svoa (VABYSMO ) 6mg /0.54mL intravitreal  injection     Return in about 4 weeks (around 11/15/2023) for BRVO OD, DFE, OCT, Possible Injxn.  This document serves as a record of services personally performed by Redell JUDITHANN Hans, MD, PhD. It was created on their behalf by Almetta Pesa, an ophthalmic technician. The creation of this record is the provider's dictation and/or activities during the visit.    Electronically signed by: Almetta Pesa, OA, 10/22/23  11:43 PM  Redell JUDITHANN Hans, M.D., Ph.D. Diseases & Surgery of the Retina and Vitreous [facility]  I have reviewed the above documentation for accuracy and completeness, and I agree with the above. Redell JUDITHANN Hans, M.D., Ph.D. 10/22/23 11:44 PM   Abbreviations: M myopia (nearsighted); A astigmatism; H hyperopia (farsighted); P presbyopia; Mrx spectacle prescription;  CTL contact lenses; OD right eye; OS left eye; OU both eyes  XT exotropia; ET esotropia; PEK punctate epithelial keratitis; PEE punctate epithelial erosions; DES dry eye syndrome; MGD meibomian gland dysfunction; ATs artificial tears; PFAT's preservative free artificial tears; NSC nuclear sclerotic cataract; PSC posterior subcapsular cataract; ERM epi-retinal membrane; PVD posterior vitreous detachment; RD retinal detachment; DM diabetes mellitus; DR diabetic retinopathy; NPDR non-proliferative diabetic retinopathy; PDR proliferative diabetic retinopathy; CSME clinically significant macular edema; DME diabetic macular edema; dbh dot blot hemorrhages; CWS cotton wool spot; POAG primary open angle glaucoma; C/D cup-to-disc ratio; HVF humphrey visual field; GVF goldmann visual field; OCT optical coherence tomography; IOP intraocular pressure; BRVO Branch retinal vein occlusion; CRVO central retinal vein occlusion; CRAO central retinal artery occlusion; BRAO branch retinal artery occlusion; RT retinal tear; SB scleral buckle; PPV pars plana vitrectomy; VH Vitreous hemorrhage; PRP panretinal laser  photocoagulation; IVK intravitreal kenalog; VMT vitreomacular traction; MH Macular hole;  NVD neovascularization of the disc; NVE neovascularization elsewhere; AREDS age related eye disease study; ARMD age related macular degeneration; POAG primary open angle glaucoma; EBMD epithelial/anterior basement membrane dystrophy; ACIOL anterior chamber intraocular lens; IOL intraocular lens; PCIOL posterior chamber intraocular lens; Phaco/IOL phacoemulsification with intraocular lens placement; PRK photorefractive keratectomy; LASIK laser assisted in situ keratomileusis; HTN hypertension; DM diabetes mellitus; COPD chronic obstructive pulmonary disease

## 2023-10-16 ENCOUNTER — Encounter: Payer: Self-pay | Admitting: Internal Medicine

## 2023-10-18 ENCOUNTER — Encounter (INDEPENDENT_AMBULATORY_CARE_PROVIDER_SITE_OTHER): Payer: Self-pay | Admitting: Ophthalmology

## 2023-10-18 ENCOUNTER — Ambulatory Visit (INDEPENDENT_AMBULATORY_CARE_PROVIDER_SITE_OTHER): Admitting: Ophthalmology

## 2023-10-18 DIAGNOSIS — Z794 Long term (current) use of insulin: Secondary | ICD-10-CM | POA: Diagnosis not present

## 2023-10-18 DIAGNOSIS — H35371 Puckering of macula, right eye: Secondary | ICD-10-CM

## 2023-10-18 DIAGNOSIS — I10 Essential (primary) hypertension: Secondary | ICD-10-CM

## 2023-10-18 DIAGNOSIS — H34831 Tributary (branch) retinal vein occlusion, right eye, with macular edema: Secondary | ICD-10-CM

## 2023-10-18 DIAGNOSIS — Z961 Presence of intraocular lens: Secondary | ICD-10-CM

## 2023-10-18 DIAGNOSIS — E113213 Type 2 diabetes mellitus with mild nonproliferative diabetic retinopathy with macular edema, bilateral: Secondary | ICD-10-CM

## 2023-10-18 DIAGNOSIS — Z7984 Long term (current) use of oral hypoglycemic drugs: Secondary | ICD-10-CM | POA: Diagnosis not present

## 2023-10-18 DIAGNOSIS — H35033 Hypertensive retinopathy, bilateral: Secondary | ICD-10-CM

## 2023-10-18 MED ORDER — FARICIMAB-SVOA 6 MG/0.05ML IZ SOSY
6.0000 mg | PREFILLED_SYRINGE | INTRAVITREAL | Status: AC | PRN
  Administered 2023-10-18: 6 mg via INTRAVITREAL

## 2023-10-23 ENCOUNTER — Ambulatory Visit: Admitting: Surgery

## 2023-10-23 ENCOUNTER — Encounter: Payer: Self-pay | Admitting: Surgery

## 2023-10-23 ENCOUNTER — Inpatient Hospital Stay

## 2023-10-23 ENCOUNTER — Inpatient Hospital Stay: Attending: Internal Medicine

## 2023-10-23 VITALS — BP 140/79 | HR 69 | Ht 65.0 in | Wt 170.0 lb

## 2023-10-23 DIAGNOSIS — N184 Chronic kidney disease, stage 4 (severe): Secondary | ICD-10-CM

## 2023-10-23 DIAGNOSIS — K8020 Calculus of gallbladder without cholecystitis without obstruction: Secondary | ICD-10-CM

## 2023-10-23 DIAGNOSIS — D509 Iron deficiency anemia, unspecified: Secondary | ICD-10-CM | POA: Diagnosis present

## 2023-10-23 DIAGNOSIS — M940 Chondrocostal junction syndrome [Tietze]: Secondary | ICD-10-CM

## 2023-10-23 DIAGNOSIS — D649 Anemia, unspecified: Secondary | ICD-10-CM

## 2023-10-23 LAB — HEMOGLOBIN AND HEMATOCRIT (CANCER CENTER ONLY)
HCT: 32.2 % — ABNORMAL LOW (ref 36.0–46.0)
Hemoglobin: 10.7 g/dL — ABNORMAL LOW (ref 12.0–15.0)

## 2023-10-23 NOTE — Progress Notes (Signed)
 10/23/2023  History of Present Illness: Cheryl Leonard is a 78 y.o. female presenting for evaluation of right upper quadrant pain.  The patient reports that over the past two months she has been experiencing constant right upper quadrant pain that she rates about 5/10.  She reports at times it may get more aggravated, and at times it may ease up a bit, but overall remains at a 5/10 for the most part.  Denies any nausea, vomiting, any worsening with po intake, any constipation, fevers, or chills.  She does endorse having a fall about 3 months ago, but she landed on her left side.  She has a history of robotic assisted sigmoidectomy for colovesical fistula in 01/25/21 with Dr. Lane.  On recent imaging she has been diagnosed with cholelithiasis.  Past Medical History: Past Medical History:  Diagnosis Date   Anemia    Anxiety    Arthritis    Gout   Cataracts, both eyes    Diabetic retinopathy (HCC)    NPDR OU   Diverticulitis    GERD (gastroesophageal reflux disease)    Gout    Headache    h/o migraines   History of fracture of patella    right knee   History of positive PPD    Patient always shows positive   Hyperlipidemia    Hypertension    Hypertensive retinopathy    OU   Hypothyroidism    Lichen sclerosus 12/30/2013   of vulva   Metatarsal fracture    Nausea and vomiting 04/11/2023   Neuropathy    Osteopenia    Peripheral vascular disease    Polyneuropathy    numbness and tingling in feet and toes   Renal insufficiency    Stage 3   Sleep apnea    does not use cpap-lost weight    Type 2 diabetes mellitus, uncontrolled      Past Surgical History: Past Surgical History:  Procedure Laterality Date   ABDOMINAL HYSTERECTOMY     AMPUTATION TOE Right 05/08/2020   Procedure: AMPUTATION TOE-Right 4th Toe;  Surgeon: Lennie Barter, DPM;  Location: [facility];  Service: Podiatry;  Laterality: Right;   AMPUTATION TOE Left 04/22/2021   Procedure: AMPUTATION TOE - 4TH  METARSOPHANGEAL JOINT;  Surgeon: Lennie Barter, DPM;  Location: [facility];  Service: Podiatry;  Laterality: Left;   APPENDECTOMY     BREAST REDUCTION SURGERY  2001   CATARACT EXTRACTION     CESAREAN SECTION  1976   COLONOSCOPY  03/05/2013   Nml - due for repeat 03/06/2018   COLONOSCOPY WITH PROPOFOL  N/A 03/18/2019   Procedure: COLONOSCOPY WITH PROPOFOL ;  Surgeon: Toledo, Ladell POUR, MD;  Location: [facility];  Service: Gastroenterology;  Laterality: N/A;   DIAGNOSTIC LAPAROSCOPY     DILATION AND CURETTAGE OF UTERUS  1989   ENDARTERECTOMY FEMORAL Bilateral 03/09/2018   Procedure: ENDARTERECTOMY FEMORAL;  Surgeon: Marea Selinda GORMAN, MD;  Location: [facility];  Service: Vascular;  Laterality: Bilateral;   ENDARTERECTOMY POPLITEAL Left 03/09/2018   Procedure: ENDARTERECTOMY POPLITEAL AND SFA;  Surgeon: Marea Selinda GORMAN, MD;  Location: [facility];  Service: Vascular;  Laterality: Left;   ESOPHAGOGASTRODUODENOSCOPY  03/05/2013   ESOPHAGOGASTRODUODENOSCOPY (EGD) WITH PROPOFOL  N/A 03/18/2019   Procedure: ESOPHAGOGASTRODUODENOSCOPY (EGD) WITH PROPOFOL ;  Surgeon: Toledo, Ladell POUR, MD;  Location: [facility];  Service: Gastroenterology;  Laterality: N/A;   EYE SURGERY     Eyelid Surgery  2012   INTRAMEDULLARY (IM) NAIL INTERTROCHANTERIC Left 10/30/2015   Procedure: INTRAMEDULLARY (IM) NAIL INTERTROCHANTRIC ;  Surgeon: Ozell Flake, MD;  Location: [facility];  Service: Orthopedics;  Laterality: Left;   KYPHOPLASTY N/A 10/25/2018   Procedure: L4 KYPHOPLASTY;  Surgeon: Flake Ozell, MD;  Location: [facility];  Service: Orthopedics;  Laterality: N/A;   LAPAROSCOPIC HYSTERECTOMY  2000   total   LOWER EXTREMITY ANGIOGRAPHY Left 03/08/2017   Procedure: LOWER EXTREMITY ANGIOGRAPHY;  Surgeon: Marea Selinda RAMAN, MD;  Location: [facility];  Service: Cardiovascular;  Laterality: Left;   LOWER EXTREMITY ANGIOGRAPHY Left 10/30/2017   Procedure: LOWER EXTREMITY ANGIOGRAPHY;  Surgeon: Marea Selinda RAMAN, MD;  Location: [facility];  Service: Cardiovascular;  Laterality: Left;   LOWER EXTREMITY ANGIOGRAPHY Right 03/08/2018   Procedure: LOWER EXTREMITY ANGIOGRAPHY;  Surgeon: Marea Selinda RAMAN, MD;  Location: [facility];  Service: Cardiovascular;  Laterality: Right;   LOWER EXTREMITY ANGIOGRAPHY Left 10/01/2018   Procedure: LOWER EXTREMITY ANGIOGRAPHY;  Surgeon: Marea Selinda RAMAN, MD;  Location: [facility];  Service: Cardiovascular;  Laterality: Left;   LOWER EXTREMITY ANGIOGRAPHY Right 10/08/2018   Procedure: LOWER EXTREMITY ANGIOGRAPHY;  Surgeon: Marea Selinda RAMAN, MD;  Location: [facility];  Service: Cardiovascular;  Laterality: Right;   LOWER EXTREMITY ANGIOGRAPHY Right 05/07/2020   Procedure: Lower Extremity Angiography;  Surgeon: Marea Selinda RAMAN, MD;  Location: [facility];  Service: Cardiovascular;  Laterality: Right;   LYSIS OF ADHESION  01/25/2021   Procedure: LYSIS OF ADHESION;  Surgeon: Lane Shope, MD;  Location: [facility];  Service: General;;   PERIPHERAL VASCULAR INTERVENTION  03/08/2018   Procedure: PERIPHERAL VASCULAR INTERVENTION;  Surgeon: Marea Selinda RAMAN, MD;  Location: [facility];  Service: Cardiovascular;;   PORTA CATH INSERTION N/A 02/17/2020   Procedure: PORTA CATH INSERTION;  Surgeon: Marea Selinda RAMAN, MD;  Location: [facility];  Service: Cardiovascular;  Laterality: N/A;   REDUCTION MAMMAPLASTY  1997   SACROPLASTY N/A 10/25/2018   Procedure: S1 SACROPLASTY;  Surgeon: Flake Ozell, MD;  Location: [facility];  Service: Orthopedics;  Laterality: N/A;    Home Medications: Prior to Admission medications   Medication Sig Start Date End Date Taking? Authorizing Provider  ALPRAZolam  (XANAX ) 0.25 MG tablet Take 0.25 mg by mouth daily as needed for anxiety. 05/07/18  Yes [provider]  amLODipine  (NORVASC ) 5 MG tablet Take 5 mg by mouth 2 (two) times daily.   Yes [provider]  aspirin  EC 81 MG tablet Take 1 tablet (81 mg total) by  mouth daily. 04/23/21  Yes Lennie Barter, DPM  cholecalciferol  (VITAMIN D ) 1000 units tablet Take 1,000 Units by mouth 2 (two) times daily.   Yes [provider]  dapagliflozin propanediol (FARXIGA) 5 MG TABS tablet Take 5 mg by mouth daily.   Yes [provider]  denosumab  (PROLIA ) 60 MG/ML SOLN injection Inject 60 mg into the skin every 6 (six) months.  03/15/17  Yes [provider]  escitalopram  (LEXAPRO ) 10 MG tablet Take 10 mg by mouth daily. 07/29/20  Yes [provider]  estradiol  (ESTRACE ) 0.1 MG/GM vaginal cream Place 1 Applicatorful vaginally daily as needed (vaginal irritation).   Yes [provider]  famotidine  (PEPCID ) 40 MG tablet Take 40 mg by mouth daily. 09/30/18  Yes [provider]  furosemide  (LASIX ) 20 MG tablet Take 20 mg by mouth every Wednesday. 05/07/18 09/04/26 Yes [provider]  gabapentin  (NEURONTIN ) 300 MG capsule Take 300 mg by mouth at bedtime as needed (pain). Patient taking differently: Take 600 mg  by mouth at bedtime as needed (pain). 01/19/21  Yes [provider]  levothyroxine  (SYNTHROID , LEVOTHROID) 100 MCG tablet Take 100 mcg by mouth daily before breakfast.  08/03/14  Yes [provider]  lidocaine -prilocaine  (EMLA ) cream Apply 1 application topically as needed (apply prior to port a cath access). 04/08/20  Yes Brahmanday, Govinda R, MD  Magnesium  500 MG TABS Take 500 mg by mouth every morning.    Yes [provider]  Multiple Vitamin (MULTIVITAMIN WITH MINERALS) TABS tablet Take 1 tablet by mouth daily. Centrum Silver   Yes [provider]  NOVOLOG  FLEXPEN 100 UNIT/ML FlexPen Inject into the skin 2 (two) times daily. 14 Units AM, 18 Units PM 10/06/23  Yes [provider]  nystatin  cream (MYCOSTATIN ) Apply 1 application topically daily as needed (Yeast infection). 06/03/20  Yes [provider]  olmesartan (BENICAR) 20 MG tablet Take 20 mg by mouth daily.   Yes  [provider]  pantoprazole  (PROTONIX ) 40 MG tablet Take 40 mg by mouth daily. 07/25/21  Yes [provider]  rivaroxaban  (XARELTO ) 20 MG TABS tablet Take 1 tablet (20 mg total) by mouth daily with supper. 04/23/21  Yes Lennie Barter, DPM  rosuvastatin  (CRESTOR ) 20 MG tablet Take 20 mg by mouth every morning. 03/19/18 09/04/26 Yes [provider]  sodium bicarbonate  650 MG tablet Take 650 mg by mouth 2 (two) times daily.   Yes [provider]  traMADol  (ULTRAM ) 50 MG tablet Take 1 tablet (50 mg total) by mouth every 12 (twelve) hours as needed for severe pain (pain score 7-10). Hold your mirtazapine  if you take this medication. 08/24/23 08/23/24 Yes Luna, Stacy, PA-C  TRESIBA FLEXTOUCH 200 UNIT/ML SOPN Inject 25-30 Units as directed at bedtime. Titrate according to fasting blood glucose not to exceed 50 units a day 11/15/16  Yes [provider]  vitamin B-12 (CYANOCOBALAMIN ) 1000 MCG tablet Take 1,000 mcg by mouth daily.   Yes [provider]  vitamin C  (ASCORBIC ACID ) 250 MG tablet Take 250 mg by mouth daily.   Yes [provider]  vitamin E  400 UNIT capsule Take 400 Units by mouth daily.   Yes [provider]  zolpidem  (AMBIEN ) 10 MG tablet Take 10 mg by mouth at bedtime. 06/06/20  Yes [provider]    Allergies: Allergies  Allergen Reactions   Ace Inhibitors Other (See Comments)    Unknown  Other Reaction(s): Intolerance    Review of Systems: Review of Systems  Constitutional:  Negative for chills and fever.  Respiratory:  Negative for shortness of breath.   Cardiovascular:  Negative for chest pain.  Gastrointestinal:  Positive for abdominal pain.    Physical Exam BP (!) 140/79   Pulse 69   Ht 5' 5 (1.651 m)   Wt 170 lb (77.1 kg)   SpO2 98%   BMI 28.29 kg/m  CONSTITUTIONAL: No acute distress HEENT:  Normocephalic, atraumatic, extraocular motion intact. RESPIRATORY:  Lungs are clear, and breath  sounds are equal bilaterally. Normal respiratory effort without pathologic use of accessory muscles. CARDIOVASCULAR: Heart is regular without murmurs, gallops, or rubs. GI: The abdomen is soft, non-distended, non-tender to palpation. Negative Murphy's sign. MUSCULOSKELETAL:  The patient has point tenderness in the inferior aspect of the right rib cage, about rib # 7.  This does not extend to other ribs and is localized and reproducible to palpation. NEUROLOGIC:  Motor and sensation is grossly normal.  Cranial nerves are grossly intact. PSYCH:  Alert and oriented  to person, place and time. Affect is normal.   Assessment and Plan: This is a 78 y.o. female with likely right costochondritis.  --Discussed with the patient the findings on exam.  Her symptoms are very reproducible with spot palpation and is localized around rib #7 on the right side.  Negative Murphy's sign.  Her pain also does not seem to be related to po intake and denies any nausea or emesis.  Do not think this is related to cholelithiasis and rather is likely related to costochondritis.   --Discussed with her using Tylenol  for pain control and alternating between ice and heat.  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  She can also use Lidocaine  patches or Salon Pas patches to help with pain control.  This should resolve on its own. --Return precautions given particularly if no improvement in symptoms.  Otherwise follow up as needed with us  or her PCP.  I spent 30 minutes dedicated to the care of this patient on the date of this encounter to include pre-visit review of records, face-to-face time with the patient discussing diagnosis and management, and any post-visit coordination of care.   Aloysius Sheree Plant, MD  [facility]

## 2023-10-23 NOTE — Patient Instructions (Addendum)
 We recommend giving this 4-5 more weeks to improve. If this does not happen do call us  and we will get you set up for some imaging of this area.   You may use Lidocaine  patches or salonpas patches for comfort.   Follow-up with our office as needed.  Please call and ask to speak with a nurse if you develop questions or concerns.   Costochondritis  Costochondritis is irritation and swelling (inflammation) of the tissue that connects the ribs to the breastbone (sternum). This tissue is called cartilage. This condition causes pain in the front of the chest. The pain often starts slowly. It may be in more than one rib. What are the causes? The cause of this condition is not always known. It can come from stress on the sternum. The cause of this stress could be: Chest injury. Exercise or activity. This may include lifting. Very bad coughing. What increases the risk? Being female. Being 68-44 years old. Starting a new exercise or work activity. Having low levels of vitamin D . Having a condition that makes you cough a lot. What are the signs or symptoms? Chest pain that: Starts slowly. It can be sharp or dull. Gets worse with deep breathing, coughing, or exercise. Gets better with rest. May be worse when you press on your ribs and breastbone. How is this treated? In most cases, this condition goes away on its own over time. You may need to take an NSAID, such as ibuprofen . This can help reduce pain. You may also need to: Rest and stay away from activities that make pain worse. Put heat or ice on the area that hurts. Do exercises to stretch your chest muscles. If these treatments do not help, your doctor may inject a medicine to numb the area. This can help relieve the pain. Follow these instructions at home: Managing pain, stiffness, and swelling     If told, put ice on the painful area. To do this: Put ice in a plastic bag. Place a towel between your skin and the bag. Leave the  ice on for 20 minutes, 2-3 times a day. If told, put heat on the affected area. Do this as often as told by your doctor. Use the heat source that your doctor recommends, such as a moist heat pack or a heating pad. Place a towel between your skin and the heat source. Leave the heat on for 20-30 minutes. If your skin turns bright red, take off the ice or heat right away to prevent skin damage. The risk of skin damage is higher if you cannot feel pain, heat, or cold. Activity Rest as told by your doctor. Do not do things that make your pain worse. This includes activities that use your chest, belly (abdomen), and side muscles. You may have to avoid lifting. Ask your doctor how much you can safely lift. Return to your normal activities when your doctor says that it is safe. General instructions Take over-the-counter and prescription medicines only as told by your doctor. Contact a doctor if: You have chills or a fever. Your pain does not go away or gets worse. You have a cough that does not go away. Get help right away if: You have a hard time breathing. You have very bad chest pain that does not get better with medicines, heat, or ice. These symptoms may be an emergency. Get help right away. Call 911. Do not wait to see if the symptoms will go away. Do not drive yourself to  the hospital. This information is not intended to replace advice given to you by your health care provider. Make sure you discuss any questions you have with your health care provider. Document Revised: 07/08/2021 Document Reviewed: 07/08/2021 Elsevier Patient Education  2024 [employer].

## 2023-10-23 NOTE — Progress Notes (Signed)
 Hgb 10.7 today, no inj given.

## 2023-11-03 NOTE — Progress Notes (Deleted)
 Referring Physician:  Cleotilde Oneil FALCON, MD [phone] [facility] [address] [facility] [place],  [place] [address]  Primary Physician:  Cleotilde Oneil FALCON, MD  History of Present Illness: [name] has a history of HTN, PVD, DM with neuropathy, hypothyroidism, osteoporosis, CKD stage 3b, iron  deficiency anemia, hyperlipidemia, lichen sclerosus, depression.   Last seen by me on 08/24/23 for LBP and left leg pain. History of compression fracture L3 and kyphoplasty L4. 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 Left leg pain may be from severe left foraminal stenosis L4-L5.   She declined PT. She was sent to PMR to discuss injections.   She is here for follow up.   How was her grandson's wedding??***  She has been seeing Dr. Dodson and she did well with lumbar ESI on 09/22/23. She then had left hip injection under fluoro on 10/26/23.       Saw ortho on 08/14/23 with LBP after a fall 3 months ago. History of L4 compression fracture for which she had kyphoplasty done.   She had left GTB injection on 08/14/23 by Dr. Kathlynn.   Had a fall about 3 months ago, she started having left hip pain to the knee.  She has constant LBP with left lateral leg pain to her knee along with numbness and tingling. Has some radiation into her left groin. No right leg pain. Pain started after above fall. Pain is worse with sitting and better with walking. She has weakness in left leg as well.   No significant relief with above left GTB injection.   She is on XARELTO .   Tobacco use: Does not smoke.   Bowel/Bladder Dysfunction: none  Conservative measures:  Physical therapy: has not participated in  Multimodal medical therapy including regular antiinflammatories: Tylenol , Gabapentin  Injections:  10/26/23: left hip injection under fluoro 09/22/23: left L4-L5 and left L5-S1 TF ESI (70% better) 08/14/2023- trochanteric injection left hip  Past Surgery:   10/25/2018- Kyphoplasty and sacroplasty  Cheryl Leonard has no symptoms of cervical myelopathy.  The symptoms are causing a significant impact on the patient's life.   Review of Systems:  A 10 point review of systems is negative, except for the pertinent positives and negatives detailed in the HPI.  Past Medical History: Past Medical History:  Diagnosis Date   Anemia    Anxiety    Arthritis    Gout   Cataracts, both eyes    Diabetic retinopathy (HCC)    NPDR OU   Diverticulitis    GERD (gastroesophageal reflux disease)    Gout    Headache    h/o migraines   History of fracture of patella    right knee   History of positive PPD    Patient always shows positive   Hyperlipidemia    Hypertension    Hypertensive retinopathy    OU   Hypothyroidism    Lichen sclerosus 12/30/2013   of vulva   Metatarsal fracture    Nausea and vomiting 04/11/2023   Neuropathy    Osteopenia    Peripheral vascular disease    Polyneuropathy    numbness and tingling in feet and toes   Renal insufficiency    Stage 3   Sleep apnea    does not use cpap-lost weight    Type 2 diabetes mellitus, uncontrolled     Past Surgical History: Past Surgical History:  Procedure Laterality Date   ABDOMINAL HYSTERECTOMY     AMPUTATION TOE Right  05/08/2020   Procedure: AMPUTATION TOE-Right 4th Toe;  Surgeon: Lennie Barter, DPM;  Location: [facility];  Service: Podiatry;  Laterality: Right;   AMPUTATION TOE Left 04/22/2021   Procedure: AMPUTATION TOE - 4TH METARSOPHANGEAL JOINT;  Surgeon: Lennie Barter, DPM;  Location: [facility];  Service: Podiatry;  Laterality: Left;   APPENDECTOMY     BREAST REDUCTION SURGERY  2001   CATARACT EXTRACTION     CESAREAN SECTION  1976   COLONOSCOPY  03/05/2013   Nml - due for repeat 03/06/2018   COLONOSCOPY WITH PROPOFOL  N/A 03/18/2019   Procedure: COLONOSCOPY WITH PROPOFOL ;  Surgeon: Toledo, Ladell POUR, MD;  Location: [facility];  Service: Gastroenterology;   Laterality: N/A;   DIAGNOSTIC LAPAROSCOPY     DILATION AND CURETTAGE OF UTERUS  1989   ENDARTERECTOMY FEMORAL Bilateral 03/09/2018   Procedure: ENDARTERECTOMY FEMORAL;  Surgeon: Marea Selinda RAMAN, MD;  Location: [facility];  Service: Vascular;  Laterality: Bilateral;   ENDARTERECTOMY POPLITEAL Left 03/09/2018   Procedure: ENDARTERECTOMY POPLITEAL AND SFA;  Surgeon: Marea Selinda RAMAN, MD;  Location: [facility];  Service: Vascular;  Laterality: Left;   ESOPHAGOGASTRODUODENOSCOPY  03/05/2013   ESOPHAGOGASTRODUODENOSCOPY (EGD) WITH PROPOFOL  N/A 03/18/2019   Procedure: ESOPHAGOGASTRODUODENOSCOPY (EGD) WITH PROPOFOL ;  Surgeon: Toledo, Ladell POUR, MD;  Location: [facility];  Service: Gastroenterology;  Laterality: N/A;   EYE SURGERY     Eyelid Surgery  2012   INTRAMEDULLARY (IM) NAIL INTERTROCHANTERIC Left 10/30/2015   Procedure: INTRAMEDULLARY (IM) NAIL INTERTROCHANTRIC ;  Surgeon: Ozell Flake, MD;  Location: [facility];  Service: Orthopedics;  Laterality: Left;   KYPHOPLASTY N/A 10/25/2018   Procedure: L4 KYPHOPLASTY;  Surgeon: Flake Ozell, MD;  Location: [facility];  Service: Orthopedics;  Laterality: N/A;   LAPAROSCOPIC HYSTERECTOMY  2000   total   LOWER EXTREMITY ANGIOGRAPHY Left 03/08/2017   Procedure: LOWER EXTREMITY ANGIOGRAPHY;  Surgeon: Marea Selinda RAMAN, MD;  Location: [facility];  Service: Cardiovascular;  Laterality: Left;   LOWER EXTREMITY ANGIOGRAPHY Left 10/30/2017   Procedure: LOWER EXTREMITY ANGIOGRAPHY;  Surgeon: Marea Selinda RAMAN, MD;  Location: [facility];  Service: Cardiovascular;  Laterality: Left;   LOWER EXTREMITY ANGIOGRAPHY Right 03/08/2018   Procedure: LOWER EXTREMITY ANGIOGRAPHY;  Surgeon: Marea Selinda RAMAN, MD;  Location: [facility];  Service: Cardiovascular;  Laterality: Right;   LOWER EXTREMITY ANGIOGRAPHY Left 10/01/2018   Procedure: LOWER EXTREMITY ANGIOGRAPHY;  Surgeon: Marea Selinda RAMAN, MD;  Location: [facility];  Service: Cardiovascular;  Laterality:  Left;   LOWER EXTREMITY ANGIOGRAPHY Right 10/08/2018   Procedure: LOWER EXTREMITY ANGIOGRAPHY;  Surgeon: Marea Selinda RAMAN, MD;  Location: [facility];  Service: Cardiovascular;  Laterality: Right;   LOWER EXTREMITY ANGIOGRAPHY Right 05/07/2020   Procedure: Lower Extremity Angiography;  Surgeon: Marea Selinda RAMAN, MD;  Location: [facility];  Service: Cardiovascular;  Laterality: Right;   LYSIS OF ADHESION  01/25/2021   Procedure: LYSIS OF ADHESION;  Surgeon: Lane Shope, MD;  Location: [facility];  Service: General;;   PERIPHERAL VASCULAR INTERVENTION  03/08/2018   Procedure: PERIPHERAL VASCULAR INTERVENTION;  Surgeon: Marea Selinda RAMAN, MD;  Location: [facility];  Service: Cardiovascular;;   PORTA CATH INSERTION N/A 02/17/2020   Procedure: PORTA CATH INSERTION;  Surgeon: Marea Selinda RAMAN, MD;  Location: [facility];  Service: Cardiovascular;  Laterality: N/A;   REDUCTION MAMMAPLASTY  1997   SACROPLASTY N/A 10/25/2018   Procedure: S1 SACROPLASTY;  Surgeon: Flake Ozell, MD;  Location: [facility];  Service: Orthopedics;  Laterality: N/A;    Allergies: Allergies as of 11/07/2023 - Review Complete 10/23/2023  Allergen Reaction Noted   Ace inhibitors Other (See Comments) 09/23/2019    Medications: Outpatient Encounter Medications as of 11/07/2023  Medication Sig   ALPRAZolam  (XANAX ) 0.25 MG tablet Take 0.25 mg by mouth daily as needed for anxiety.   amLODipine  (NORVASC ) 5 MG tablet Take 5 mg by mouth 2 (two) times daily.   aspirin  EC 81 MG tablet Take 1 tablet (81 mg total) by mouth daily.   cholecalciferol  (VITAMIN D ) 1000 units tablet Take 1,000 Units by mouth 2 (two) times daily.   dapagliflozin propanediol (FARXIGA) 5 MG TABS tablet Take 5 mg by mouth daily.   denosumab  (PROLIA ) 60 MG/ML SOLN injection Inject 60 mg into the skin every 6 (six) months.    escitalopram  (LEXAPRO ) 10 MG tablet Take 10 mg by mouth daily.   estradiol  (ESTRACE ) 0.1 MG/GM vaginal cream  Place 1 Applicatorful vaginally daily as needed (vaginal irritation).   famotidine  (PEPCID ) 40 MG tablet Take 40 mg by mouth daily.   furosemide  (LASIX ) 20 MG tablet Take 20 mg by mouth every Wednesday.   gabapentin  (NEURONTIN ) 300 MG capsule Take 300 mg by mouth at bedtime as needed (pain). (Patient taking differently: Take 600 mg by mouth at bedtime as needed (pain).)   levothyroxine  (SYNTHROID , LEVOTHROID) 100 MCG tablet Take 100 mcg by mouth daily before breakfast.    lidocaine -prilocaine  (EMLA ) cream Apply 1 application topically as needed (apply prior to port a cath access).   Magnesium  500 MG TABS Take 500 mg by mouth every morning.    Multiple Vitamin (MULTIVITAMIN WITH MINERALS) TABS tablet Take 1 tablet by mouth daily. Centrum Silver   NOVOLOG  FLEXPEN 100 UNIT/ML FlexPen Inject into the skin 2 (two) times daily. 14 Units AM, 18 Units PM   nystatin  cream (MYCOSTATIN ) Apply 1 application topically daily as needed (Yeast infection).   olmesartan (BENICAR) 20 MG tablet Take 20 mg by mouth daily.   pantoprazole  (PROTONIX ) 40 MG tablet Take 40 mg by mouth daily.   rivaroxaban  (XARELTO ) 20 MG TABS tablet Take 1 tablet (20 mg total) by mouth daily with supper.   rosuvastatin  (CRESTOR ) 20 MG tablet Take 20 mg by mouth every morning.   sodium bicarbonate  650 MG tablet Take 650 mg by mouth 2 (two) times daily.   traMADol  (ULTRAM ) 50 MG tablet Take 1 tablet (50 mg total) by mouth every 12 (twelve) hours as needed for severe pain (pain score 7-10). Hold your mirtazapine  if you take this medication.   TRESIBA FLEXTOUCH 200 UNIT/ML SOPN Inject 25-30 Units as directed at bedtime. Titrate according to fasting blood glucose not to exceed 50 units a day   vitamin B-12 (CYANOCOBALAMIN ) 1000 MCG tablet Take 1,000 mcg by mouth daily.   vitamin C  (ASCORBIC ACID ) 250 MG tablet Take 250 mg by mouth daily.   vitamin E  400 UNIT capsule Take 400 Units by mouth daily.   zolpidem  (AMBIEN ) 10 MG tablet Take 10 mg by  mouth at bedtime.   No facility-administered encounter medications on file as of 11/07/2023.    Social History: Social History   Tobacco Use   Smoking status: Former    Current packs/day: 0.00    Average packs/day: 1 pack/day for 20.0 years (20.0 ttl pk-yrs)    Types: Cigarettes    Start date: 03/07/1976    Quit date: 03/07/1996    Years since quitting: 27.6    Passive exposure:  Past   Smokeless tobacco: Never   Tobacco comments:    started smoking at age 69 but stopped smoking in 2000  Vaping Use   Vaping status: Never Used  Substance Use Topics   Alcohol use: No    Alcohol/week: 0.0 standard drinks of alcohol   Drug use: No    Family Medical History: Family History  Problem Relation Age of Onset   Coronary artery disease Father    Heart attack Father    Coronary artery disease Mother    Heart attack Mother    Ovarian cancer Sister 17       sister had hormonal therapy for IVF txs-which increased risk factor for ovarian cancer   Breast cancer Neg Hx     Physical Examination: There were no vitals filed for this visit.    Awake, alert, oriented to person, place, and time.  Speech is clear and fluent. Fund of knowledge is appropriate.   Cranial Nerves: Pupils equal round and reactive to light.  Facial tone is symmetric.    No current posterior lumbar tenderness.   No abnormal lesions on exposed skin.   Strength: Side Biceps Triceps Deltoid Interossei Grip Wrist Ext. Wrist Flex.  R 5 5 5 5 5 5 5   L 5 5 5 5 5 5 5    Side Iliopsoas Quads Hamstring PF DF EHL  R 5 5 5 5 5 5   L 5 5 5 5 5 5    Reflexes are 2+ and symmetric at the biceps, brachioradialis, patella and achilles.   Hoffman's is absent.  Clonus is not present.   Bilateral upper and lower extremity sensation is intact to light touch.     No pain with IR/ER of both hips. No tenderness over GTB bilaterally.   Gait is slow.   Medical Decision Making  Imaging: none  Assessment and Plan: Ms. Kassing had  a fall about 3 months ago and since that time she has constant LBP with left lateral leg pain to her knee along with numbness and tingling. Pain is worse with sitting and better with walking.   History of compression fracture L3 and kyphoplasty L4. 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 Left leg pain may be from severe left foraminal stenosis L4-L5.   Clinically, she looks better than her MRI.   Treatment options discussed with patient and following plan made:   - Discussed PT for lumbar spine. She declines for now.  - One time prescription for ultram  to use for severe pain.  Reviewed dosing and side effects. Her grandson is getting married this weekend and she can have this on hand.  - Referral to PMR at [facility] to discuss possible lumbar injections.  - Follow up with me in 8 weeks for recheck.   I spent a total of 35 minutes in face-to-face and non-face-to-face activities related to this patient's care today including review of outside records, review of imaging, review of symptoms, physical exam, discussion of differential diagnosis, discussion of treatment options, and documentation.   Thank you for involving me in the care of this patient.   Glade Boys PA-C Dept. of Neurosurgery

## 2023-11-07 ENCOUNTER — Ambulatory Visit: Admitting: Orthopedic Surgery

## 2023-11-09 ENCOUNTER — Other Ambulatory Visit: Payer: Self-pay | Admitting: Physical Medicine & Rehabilitation

## 2023-11-09 DIAGNOSIS — M5414 Radiculopathy, thoracic region: Secondary | ICD-10-CM

## 2023-11-10 NOTE — Progress Notes (Signed)
 [facility] - Clinic Note  11/15/2023     CHIEF COMPLAINT Patient presents for Retina Follow Up   HISTORY OF PRESENT ILLNESS: Cheryl Leonard is a 78 y.o. female who presents to the clinic today for:    HPI     Retina Follow Up   Patient presents with  CRVO/BRVO.  In right eye.  This started 4 weeks ago.  Duration of 4 weeks.  Since onset it is stable.  I, the attending physician,  performed the HPI with the patient and updated documentation appropriately.        Comments   4 week retina follow up BRVO OD and IVV OD pt is reporting no vision changes noticed she denies any flashes or floaters       Last edited by Valdemar Rogue, MD on 11/15/2023  9:37 PM.     Patient states her vision is stable.   Referring physician: Cleotilde Oneil FALCON, MD [phone] [facility] [address] [facility] [place],  [place] [address]  HISTORICAL INFORMATION:   Selected notes from the MEDICAL RECORD NUMBER Referred by Dr. Estelle for concern of DME OD Lab Results  Component Value Date   HGBA1C 7.3 (H) 04/07/2023     CURRENT MEDICATIONS: No current outpatient medications on file. (Ophthalmic Drugs)   No current facility-administered medications for this visit. (Ophthalmic Drugs)   Current Outpatient Medications (Other)  Medication Sig   ALPRAZolam  (XANAX ) 0.25 MG tablet Take 0.25 mg by mouth daily as needed for anxiety.   amLODipine  (NORVASC ) 5 MG tablet Take 5 mg by mouth 2 (two) times daily.   aspirin  EC 81 MG tablet Take 1 tablet (81 mg total) by mouth daily.   cholecalciferol  (VITAMIN D ) 1000 units tablet Take 1,000 Units by mouth 2 (two) times daily.   dapagliflozin propanediol (FARXIGA) 5 MG TABS tablet Take 5 mg by mouth daily.   denosumab  (PROLIA ) 60 MG/ML SOLN injection Inject 60 mg into the skin every 6 (six) months.    escitalopram  (LEXAPRO ) 10 MG tablet Take 10 mg by mouth daily.   estradiol  (ESTRACE ) 0.1 MG/GM vaginal cream Place 1  Applicatorful vaginally daily as needed (vaginal irritation).   famotidine  (PEPCID ) 40 MG tablet Take 40 mg by mouth daily.   furosemide  (LASIX ) 20 MG tablet Take 20 mg by mouth every Wednesday.   gabapentin  (NEURONTIN ) 300 MG capsule Take 300 mg by mouth at bedtime as needed (pain). (Patient taking differently: Take 600 mg by mouth at bedtime as needed (pain).)   levothyroxine  (SYNTHROID , LEVOTHROID) 100 MCG tablet Take 100 mcg by mouth daily before breakfast.    lidocaine -prilocaine  (EMLA ) cream Apply 1 application topically as needed (apply prior to port a cath access).   Magnesium  500 MG TABS Take 500 mg by mouth every morning.    Multiple Vitamin (MULTIVITAMIN WITH MINERALS) TABS tablet Take 1 tablet by mouth daily. Centrum Silver   NOVOLOG  FLEXPEN 100 UNIT/ML FlexPen Inject into the skin 2 (two) times daily. 14 Units AM, 18 Units PM   nystatin  cream (MYCOSTATIN ) Apply 1 application topically daily as needed (Yeast infection).   olmesartan (BENICAR) 20 MG tablet Take 20 mg by mouth daily.   pantoprazole  (PROTONIX ) 40 MG tablet Take 40 mg by mouth daily.   rivaroxaban  (XARELTO ) 20 MG TABS tablet Take 1 tablet (20 mg total) by mouth daily with supper.   rosuvastatin  (CRESTOR ) 20 MG tablet Take 20 mg by mouth every morning.   sodium bicarbonate  650 MG  tablet Take 650 mg by mouth 2 (two) times daily.   traMADol  (ULTRAM ) 50 MG tablet Take 1 tablet (50 mg total) by mouth every 12 (twelve) hours as needed for severe pain (pain score 7-10). Hold your mirtazapine  if you take this medication.   TRESIBA FLEXTOUCH 200 UNIT/ML SOPN Inject 25-30 Units as directed at bedtime. Titrate according to fasting blood glucose not to exceed 50 units a day   vitamin B-12 (CYANOCOBALAMIN ) 1000 MCG tablet Take 1,000 mcg by mouth daily.   vitamin C  (ASCORBIC ACID ) 250 MG tablet Take 250 mg by mouth daily.   vitamin E  400 UNIT capsule Take 400 Units by mouth daily.   zolpidem  (AMBIEN ) 10 MG tablet Take 10 mg by mouth at  bedtime.   No current facility-administered medications for this visit. (Other)   REVIEW OF SYSTEMS: ROS   Positive for: Gastrointestinal, Musculoskeletal, Endocrine, Cardiovascular, Eyes, Respiratory Negative for: Constitutional, Neurological, Skin, Genitourinary, HENT, Psychiatric, Allergic/Imm, Heme/Lymph Last edited by Resa Delon ORN, COT on 11/15/2023 12:31 PM.     ALLERGIES Allergies  Allergen Reactions   Ace Inhibitors Other (See Comments)    Unknown  Other Reaction(s): Intolerance   PAST MEDICAL HISTORY Past Medical History:  Diagnosis Date   Anemia    Anxiety    Arthritis    Gout   Cataracts, both eyes    Diabetic retinopathy (HCC)    NPDR OU   Diverticulitis    GERD (gastroesophageal reflux disease)    Gout    Headache    h/o migraines   History of fracture of patella    right knee   History of positive PPD    Patient always shows positive   Hyperlipidemia    Hypertension    Hypertensive retinopathy    OU   Hypothyroidism    Lichen sclerosus 12/30/2013   of vulva   Metatarsal fracture    Nausea and vomiting 04/11/2023   Neuropathy    Osteopenia    Peripheral vascular disease    Polyneuropathy    numbness and tingling in feet and toes   Renal insufficiency    Stage 3   Sleep apnea    does not use cpap-lost weight    Type 2 diabetes mellitus, uncontrolled    Past Surgical History:  Procedure Laterality Date   ABDOMINAL HYSTERECTOMY     AMPUTATION TOE Right 05/08/2020   Procedure: AMPUTATION TOE-Right 4th Toe;  Surgeon: Lennie Barter, DPM;  Location: [facility];  Service: Podiatry;  Laterality: Right;   AMPUTATION TOE Left 04/22/2021   Procedure: AMPUTATION TOE - 4TH METARSOPHANGEAL JOINT;  Surgeon: Lennie Barter, DPM;  Location: [facility];  Service: Podiatry;  Laterality: Left;   APPENDECTOMY     BREAST REDUCTION SURGERY  2001   CATARACT EXTRACTION     CESAREAN SECTION  1976   COLONOSCOPY  03/05/2013   Nml - due for repeat 03/06/2018    COLONOSCOPY WITH PROPOFOL  N/A 03/18/2019   Procedure: COLONOSCOPY WITH PROPOFOL ;  Surgeon: Toledo, Ladell POUR, MD;  Location: [facility];  Service: Gastroenterology;  Laterality: N/A;   DIAGNOSTIC LAPAROSCOPY     DILATION AND CURETTAGE OF UTERUS  1989   ENDARTERECTOMY FEMORAL Bilateral 03/09/2018   Procedure: ENDARTERECTOMY FEMORAL;  Surgeon: Marea Selinda RAMAN, MD;  Location: [facility];  Service: Vascular;  Laterality: Bilateral;   ENDARTERECTOMY POPLITEAL Left 03/09/2018   Procedure: ENDARTERECTOMY POPLITEAL AND SFA;  Surgeon: Marea Selinda RAMAN, MD;  Location: [facility];  Service: Vascular;  Laterality: Left;  ESOPHAGOGASTRODUODENOSCOPY  03/05/2013   ESOPHAGOGASTRODUODENOSCOPY (EGD) WITH PROPOFOL  N/A 03/18/2019   Procedure: ESOPHAGOGASTRODUODENOSCOPY (EGD) WITH PROPOFOL ;  Surgeon: Toledo, Ladell POUR, MD;  Location: [facility];  Service: Gastroenterology;  Laterality: N/A;   EYE SURGERY     Eyelid Surgery  2012   INTRAMEDULLARY (IM) NAIL INTERTROCHANTERIC Left 10/30/2015   Procedure: INTRAMEDULLARY (IM) NAIL INTERTROCHANTRIC ;  Surgeon: Ozell Flake, MD;  Location: [facility];  Service: Orthopedics;  Laterality: Left;   KYPHOPLASTY N/A 10/25/2018   Procedure: L4 KYPHOPLASTY;  Surgeon: Flake Ozell, MD;  Location: [facility];  Service: Orthopedics;  Laterality: N/A;   LAPAROSCOPIC HYSTERECTOMY  2000   total   LOWER EXTREMITY ANGIOGRAPHY Left 03/08/2017   Procedure: LOWER EXTREMITY ANGIOGRAPHY;  Surgeon: Marea Selinda RAMAN, MD;  Location: [facility];  Service: Cardiovascular;  Laterality: Left;   LOWER EXTREMITY ANGIOGRAPHY Left 10/30/2017   Procedure: LOWER EXTREMITY ANGIOGRAPHY;  Surgeon: Marea Selinda RAMAN, MD;  Location: [facility];  Service: Cardiovascular;  Laterality: Left;   LOWER EXTREMITY ANGIOGRAPHY Right 03/08/2018   Procedure: LOWER EXTREMITY ANGIOGRAPHY;  Surgeon: Marea Selinda RAMAN, MD;  Location: [facility];  Service: Cardiovascular;  Laterality: Right;   LOWER EXTREMITY  ANGIOGRAPHY Left 10/01/2018   Procedure: LOWER EXTREMITY ANGIOGRAPHY;  Surgeon: Marea Selinda RAMAN, MD;  Location: [facility];  Service: Cardiovascular;  Laterality: Left;   LOWER EXTREMITY ANGIOGRAPHY Right 10/08/2018   Procedure: LOWER EXTREMITY ANGIOGRAPHY;  Surgeon: Marea Selinda RAMAN, MD;  Location: [facility];  Service: Cardiovascular;  Laterality: Right;   LOWER EXTREMITY ANGIOGRAPHY Right 05/07/2020   Procedure: Lower Extremity Angiography;  Surgeon: Marea Selinda RAMAN, MD;  Location: [facility];  Service: Cardiovascular;  Laterality: Right;   LYSIS OF ADHESION  01/25/2021   Procedure: LYSIS OF ADHESION;  Surgeon: Lane Shope, MD;  Location: [facility];  Service: General;;   PERIPHERAL VASCULAR INTERVENTION  03/08/2018   Procedure: PERIPHERAL VASCULAR INTERVENTION;  Surgeon: Marea Selinda RAMAN, MD;  Location: [facility];  Service: Cardiovascular;;   PORTA CATH INSERTION N/A 02/17/2020   Procedure: PORTA CATH INSERTION;  Surgeon: Marea Selinda RAMAN, MD;  Location: [facility];  Service: Cardiovascular;  Laterality: N/A;   REDUCTION MAMMAPLASTY  1997   SACROPLASTY N/A 10/25/2018   Procedure: S1 SACROPLASTY;  Surgeon: Flake Ozell, MD;  Location: [facility];  Service: Orthopedics;  Laterality: N/A;   FAMILY HISTORY Family History  Problem Relation Age of Onset   Coronary artery disease Father    Heart attack Father    Coronary artery disease Mother    Heart attack Mother    Ovarian cancer Sister 56       sister had hormonal therapy for IVF txs-which increased risk factor for ovarian cancer   Breast cancer Neg Hx    SOCIAL HISTORY Social History   Tobacco Use   Smoking status: Former    Current packs/day: 0.00    Average packs/day: 1 pack/day for 20.0 years (20.0 ttl pk-yrs)    Types: Cigarettes    Start date: 03/07/1976    Quit date: 03/07/1996    Years since quitting: 27.7    Passive exposure: Past   Smokeless tobacco: Never   Tobacco comments:     started smoking at age 26 but stopped smoking in 2000  Vaping Use   Vaping status: Never Used  Substance Use Topics   Alcohol use: No    Alcohol/week: 0.0 standard drinks of alcohol   Drug  use: No       OPHTHALMIC EXAM: Base Eye Exam     Visual Acuity (Snellen - Linear)       Right Left   Dist [place] 20/30 20/20 -1   Dist ph [place] NI          Tonometry (Tonopen, 12:34 PM)       Right Left   Pressure 14` 18         Pupils       Pupils Dark Light Shape React APD   Right PERRL 3 2 Round Brisk None   Left PERRL 3 2 Round Brisk None         Visual Fields       Left Right    Full Full         Extraocular Movement       Right Left    Full, Ortho Full, Ortho         Neuro/Psych     Oriented x3: Yes   Mood/Affect: Normal         Dilation     Both eyes: 2.5% Phenylephrine  @ 12:34 PM           Slit Lamp and Fundus Exam     External Exam       Right Left   External Normal Normal         Slit Lamp Exam       Right Left   Lids/Lashes dermatochalasis dermatochalasis   Conjunctiva/Sclera White and quiet Trace Injection   Cornea arcus; well healed cataract wound; trace Punctate epithelial erosions arcus; well healed cataract wound, trace PEE   Anterior Chamber Deep and quiet Deep and quiet   Iris Round and dilated Round and dilated   Lens PCIOL; open PC PCIOL; open PC   Anterior Vitreous syneresis, Posterior vitreous detachment, vitreous condensations inferiorly syneresis, Posterior vitreous detachment         Fundus Exam       Right Left   Disc Superior hyperemia -- stably improved, mild Pallor, vascular loops Pink and Sharp   C/D Ratio 0.6 0.5   Macula Flat, Blunted foveal reflex, +cystic changes centrally--slightly improved, +Epiretinal membrane, minimal MA flat; good foveal reflex, no heme or edema, small pigment clump IT to fovea   Vessels attenuated, Tortuous attenuated, Tortuous   Periphery Attached; scattered DBH -- greastest  temporal periphery- improved Attached, no heme           IMAGING AND PROCEDURES  Imaging and Procedures for 04/25/17  OCT, Retina - OU - Both Eyes       Right Eye Quality was borderline. Central Foveal Thickness: 332. Progression has been stable. Findings include no SRF, abnormal foveal contour, epiretinal membrane, intraretinal fluid (Mind interval improvement in IRF / cystic changes temporal fovea).   Left Eye Quality was borderline. Central Foveal Thickness: 296. Progression has been stable. Findings include normal foveal contour, no IRF, no SRF.   Notes *Images captured and stored on drive  Diagnosis / Impression:  OD: Mild interval improvement in IRF / cystic changes temporal fovea OS: NFP; no IRF/SRF--stable  Clinical management:  See below  Abbreviations: NFP - Normal foveal profile. CME - cystoid macular edema. PED - pigment epithelial detachment. IRF - intraretinal fluid. SRF - subretinal fluid. EZ - ellipsoid zone. ERM - epiretinal membrane. ORA - outer retinal atrophy. ORT - outer retinal tubulation. SRHM - subretinal hyper-reflective material      Intravitreal Injection, Pharmacologic Agent - OD -  Right Eye       Time Out 11/15/2023. 12:41 PM. Confirmed correct patient, procedure, site, and patient consented.   Anesthesia Topical anesthesia was used. Anesthetic medications included Lidocaine  2%, Proparacaine 0.5%.   Procedure Preparation included 5% betadine to ocular surface, eyelid speculum. A supplied (30 g) needle was used.   Injection: 6 mg faricimab -svoa 6 MG/0.05ML   Route: Intravitreal, Site: Right Eye   NDC: 49757-903-93, Lot: [id number], Expiration date: 10/02/2024, Waste: 0 mL   Post-op Post injection exam found visual acuity of at least counting fingers. The patient tolerated the procedure well. There were no complications. The patient received written and verbal post procedure care education. Post injection medications were not given.             ASSESSMENT/PLAN:    ICD-10-CM   1. Branch retinal vein occlusion of right eye with macular edema (HCC)  H34.8310 OCT, Retina - OU - Both Eyes    Intravitreal Injection, Pharmacologic Agent - OD - Right Eye    faricimab -svoa (VABYSMO ) 6mg /0.81mL intravitreal injection    2. Both eyes affected by mild nonproliferative diabetic retinopathy with macular edema, associated with type 2 diabetes mellitus (HCC)  Z88.6786     3. Long term (current) use of oral hypoglycemic drugs  Z79.84     4. Current use of insulin  (HCC)  Z79.4     5. Essential hypertension  I10     6. Hypertensive retinopathy of both eyes  H35.033     7. Epiretinal membrane (ERM) of right eye  H35.371     8. Pseudophakia of both eyes  Z96.1      1. BRVO w/ CME OD - by history, pt states symptoms first noticed 2 wks prior to presentation, but reports changes may have occurred prior  - initial exam with differential tortuosity of vessels (OD > OS) - FA (02.10.20) shows mild late staining / leakage in macula, staining / leakage of disc -- improving CME - differential includes DM2 (DME), hypertensive retinopathy, inflammatory etiology / uveitis - S/P IVA OD #1 (02.08.19), #2 (03.11.19), #3 (04.09.19), #4 (05.20.19), #5 (02.10.20) - gave IVA OD on 2.10.20 due to pending Eylea4U for 2020 -- resulted in increased IRF/CME - review of OCT show persistent IRF and cystic changes --  resistance to IVA  - June 2019 -- switched therapies:  ============================== - S/P IVE OD #1 (06.24.19), #2 (07.24.19), #3 (09.04.19), #4 (10.30.19),#5 (12.30.19), #6 (03.23.20), #7 (05.05.20), #8 (07.16.20), #9 (07.17.20), #10 (08.28.20), #11 (10.13.20), # 12 (11.17.20), #13 (2.8.21), #14 (03.09.21), #15 (04.13.21), #16 (05.11.21), #17 (06.17.21), #18 (07.23.21), #19 (08.30.21), #20 (10.04.21), #21 (11.08.21), #22 (12.08.21), #23 (01.31.22), #24 (02.28.22), #25 (04.01.22), #26 (06.15.22), #27 (07.13.22), #28 (08.17.22), #29 (09.21.22),  #30 (10.19.22), #31 (11.16.22), #32 (12.16.22), #33 (01.13.23), #34 (02.10.23), #35 (03.15.23), #36 (04.14.23), #37 (05.15.23), #38 (06.12.23), #39 (07.10.23), #40 (08.07.23), #41 (09.06.23), #42 (10.04.23), #43 (11.01.23)  -- IVE resistance ================================ **interval increase in IRF at 5 weeks on 07.01.24 and at 6 wks on 02.28.25 (IVV)** - s/p IVV OD #1 (12.04.23), #2 (01.03.24), #3 (01.31.24), #4 (02.28.24), #5 (03.29.24), #6 (04.24.24), #7 (05.28.24), #8 (07.01.24), #9 (07.29.24), #10 (08.26.24), #11 (09.26.24), #12 (10.25.24), #13 (11.25.24), #14 (01.07.24), #15 (02.18.25), #16 (06.25.25), #17 (07.23.25), #18 (08.20.25), #19 (09.17.25), #20 (10.15.25) - s/p IVE HD OD #1 (03.25.25), #2 (04.29.25), #3 (05.27.25) - OCT today shows mild interval improvement in IRF / cystic changes temporal fovea at 4 wks  - BCVA OD 20/30 from 20/25   - recommend IVV  OD #21 today, (11.12.25) w/ f/u in 4 weeks  - RBA of procedure discussed, questions answered  - IVV informed consent obtained and signed, 06.25.25 (OD)  - see procedure note  - f/u in 4 weeks  -- DFE/OCT/possible injection  2-4. Mild nonproliferative diabetic retinopathy, both eyes  - s/p IVE HD #1 (03.25.25), #2 (04.29.25), #3 (05.27.25) - A1c 5.6 (8.14.25), 7.3 (04.04.25)  - likely DME component contributing to CME OD  - OD Interval improvement in IRF / cystic changes temporal fovea   - OS with minimal diabetic retinopathy - Eylea  HD informed consent form obtained, signed and scanned on 03.25.25  - pt approved for Eylea  HD through Medicare and supplement  - monitor OS  - f/u 4 weeks, DFE, OCT  5,6. Hypertensive retinopathy OU - stable  - as above, may have contributing to CME OD  - discussed importance of tight BP control  - monitor  7. Epiretinal membrane, right eye   - stable nasal ERM  - no indication for surgery at this time  8. Pseudophakia OU  - s/p CE/IOL OU by cataract surgeon in [facility]  - doing well  -  monitor  Ophthalmic Meds Ordered this visit:  Meds ordered this encounter  Medications   faricimab -svoa (VABYSMO ) 6mg /0.56mL intravitreal injection     Return in about 4 weeks (around 12/13/2023) for f/u, BRVO, DFE, OCT, Possible, IVV, OD.  This document serves as a record of services personally performed by Redell JUDITHANN Hans, MD, PhD. It was created on their behalf by Almetta Pesa, an ophthalmic technician. The creation of this record is the provider's dictation and/or activities during the visit.    Electronically signed by: Almetta Pesa, OA, 11/15/23  9:38 PM  This document serves as a record of services personally performed by Redell JUDITHANN Hans, MD, PhD. It was created on their behalf by Wanda GEANNIE Keens, COT an ophthalmic technician. The creation of this record is the provider's dictation and/or activities during the visit.    Electronically signed by:  Wanda GEANNIE Keens, COT  11/15/23 9:38 PM   Redell JUDITHANN Hans, M.D., Ph.D. Diseases & Surgery of the Retina and Vitreous [facility]  I have reviewed the above documentation for accuracy and completeness, and I agree with the above. Redell JUDITHANN Hans, M.D., Ph.D. 11/15/23 9:41 PM   Abbreviations: M myopia (nearsighted); A astigmatism; H hyperopia (farsighted); P presbyopia; Mrx spectacle prescription;  CTL contact lenses; OD right eye; OS left eye; OU both eyes  XT exotropia; ET esotropia; PEK punctate epithelial keratitis; PEE punctate epithelial erosions; DES dry eye syndrome; MGD meibomian gland dysfunction; ATs artificial tears; PFAT's preservative free artificial tears; NSC nuclear sclerotic cataract; PSC posterior subcapsular cataract; ERM epi-retinal membrane; PVD posterior vitreous detachment; RD retinal detachment; DM diabetes mellitus; DR diabetic retinopathy; NPDR non-proliferative diabetic retinopathy; PDR proliferative diabetic retinopathy; CSME clinically significant macular edema; DME diabetic  macular edema; dbh dot blot hemorrhages; CWS cotton wool spot; POAG primary open angle glaucoma; C/D cup-to-disc ratio; HVF humphrey visual field; GVF goldmann visual field; OCT optical coherence tomography; IOP intraocular pressure; BRVO Branch retinal vein occlusion; CRVO central retinal vein occlusion; CRAO central retinal artery occlusion; BRAO branch retinal artery occlusion; RT retinal tear; SB scleral buckle; PPV pars plana vitrectomy; VH Vitreous hemorrhage; PRP panretinal laser photocoagulation; IVK intravitreal kenalog; VMT vitreomacular traction; MH Macular hole;  NVD neovascularization of the disc; NVE neovascularization elsewhere; AREDS age related eye disease study; ARMD age related macular degeneration; POAG  primary open angle glaucoma; EBMD epithelial/anterior basement membrane dystrophy; ACIOL anterior chamber intraocular lens; IOL intraocular lens; PCIOL posterior chamber intraocular lens; Phaco/IOL phacoemulsification with intraocular lens placement; PRK photorefractive keratectomy; LASIK laser assisted in situ keratomileusis; HTN hypertension; DM diabetes mellitus; COPD chronic obstructive pulmonary disease

## 2023-11-15 ENCOUNTER — Ambulatory Visit (INDEPENDENT_AMBULATORY_CARE_PROVIDER_SITE_OTHER): Admitting: Ophthalmology

## 2023-11-15 ENCOUNTER — Encounter (INDEPENDENT_AMBULATORY_CARE_PROVIDER_SITE_OTHER): Payer: Self-pay | Admitting: Ophthalmology

## 2023-11-15 DIAGNOSIS — H35371 Puckering of macula, right eye: Secondary | ICD-10-CM

## 2023-11-15 DIAGNOSIS — H34831 Tributary (branch) retinal vein occlusion, right eye, with macular edema: Secondary | ICD-10-CM

## 2023-11-15 DIAGNOSIS — I10 Essential (primary) hypertension: Secondary | ICD-10-CM

## 2023-11-15 DIAGNOSIS — Z961 Presence of intraocular lens: Secondary | ICD-10-CM

## 2023-11-15 DIAGNOSIS — E113213 Type 2 diabetes mellitus with mild nonproliferative diabetic retinopathy with macular edema, bilateral: Secondary | ICD-10-CM

## 2023-11-15 DIAGNOSIS — Z7984 Long term (current) use of oral hypoglycemic drugs: Secondary | ICD-10-CM

## 2023-11-15 DIAGNOSIS — Z794 Long term (current) use of insulin: Secondary | ICD-10-CM | POA: Diagnosis not present

## 2023-11-15 DIAGNOSIS — H35033 Hypertensive retinopathy, bilateral: Secondary | ICD-10-CM

## 2023-11-15 MED ORDER — FARICIMAB-SVOA 6 MG/0.05ML IZ SOSY
6.0000 mg | PREFILLED_SYRINGE | INTRAVITREAL | Status: AC | PRN
  Administered 2023-11-15: 6 mg via INTRAVITREAL

## 2023-11-20 ENCOUNTER — Inpatient Hospital Stay
Admission: RE | Admit: 2023-11-20 | Discharge: 2023-11-20 | Disposition: A | Source: Ambulatory Visit | Attending: Physical Medicine & Rehabilitation | Admitting: Physical Medicine & Rehabilitation

## 2023-11-20 DIAGNOSIS — M5414 Radiculopathy, thoracic region: Secondary | ICD-10-CM

## 2023-12-06 NOTE — Progress Notes (Signed)
 [facility] - Clinic Note  12/13/2023     CHIEF COMPLAINT Patient presents for Retina Follow Up   HISTORY OF PRESENT ILLNESS: Cheryl Leonard is a 78 y.o. female who presents to the clinic today for:    HPI     Retina Follow Up   Patient presents with  Dry AMD.  In right eye.  This started 4 weeks ago.  Duration of 4 weeks.  Since onset it is stable.  I, the attending physician,  performed the HPI with the patient and updated documentation appropriately.        Comments   4 week retina follow up BRVO OD and IVV OD pt is reporting no vision changes noticed she denies any flashes or floaters       Last edited by Valdemar Rogue, MD on 12/13/2023  9:13 PM.     Patient states her vision is stable.   Referring physician: Cleotilde Oneil FALCON, MD [phone] [facility] [address] [facility] [place],  [place] [address]  HISTORICAL INFORMATION:   Selected notes from the MEDICAL RECORD NUMBER Referred by Dr. Estelle for concern of DME OD Lab Results  Component Value Date   HGBA1C 7.3 (H) 04/07/2023     CURRENT MEDICATIONS: No current outpatient medications on file. (Ophthalmic Drugs)   No current facility-administered medications for this visit. (Ophthalmic Drugs)   Current Outpatient Medications (Other)  Medication Sig   ALPRAZolam  (XANAX ) 0.25 MG tablet Take 0.25 mg by mouth daily as needed for anxiety.   amLODipine  (NORVASC ) 5 MG tablet Take 5 mg by mouth 2 (two) times daily.   aspirin  EC 81 MG tablet Take 1 tablet (81 mg total) by mouth daily.   cholecalciferol  (VITAMIN D ) 1000 units tablet Take 1,000 Units by mouth 2 (two) times daily.   dapagliflozin propanediol (FARXIGA) 5 MG TABS tablet Take 5 mg by mouth daily.   denosumab  (PROLIA ) 60 MG/ML SOLN injection Inject 60 mg into the skin every 6 (six) months.    escitalopram  (LEXAPRO ) 10 MG tablet Take 10 mg by mouth daily.   estradiol  (ESTRACE ) 0.1 MG/GM vaginal cream Place 1  Applicatorful vaginally daily as needed (vaginal irritation).   famotidine  (PEPCID ) 40 MG tablet Take 40 mg by mouth daily.   furosemide  (LASIX ) 20 MG tablet Take 20 mg by mouth every Wednesday.   gabapentin  (NEURONTIN ) 300 MG capsule Take 300 mg by mouth at bedtime as needed (pain). (Patient taking differently: Take 600 mg by mouth at bedtime as needed (pain).)   levothyroxine  (SYNTHROID , LEVOTHROID) 100 MCG tablet Take 100 mcg by mouth daily before breakfast.    lidocaine -prilocaine  (EMLA ) cream Apply 1 application topically as needed (apply prior to port a cath access).   Magnesium  500 MG TABS Take 500 mg by mouth every morning.    Multiple Vitamin (MULTIVITAMIN WITH MINERALS) TABS tablet Take 1 tablet by mouth daily. Centrum Silver   NOVOLOG  FLEXPEN 100 UNIT/ML FlexPen Inject into the skin 2 (two) times daily. 14 Units AM, 18 Units PM   nystatin  cream (MYCOSTATIN ) Apply 1 application topically daily as needed (Yeast infection).   olmesartan (BENICAR) 20 MG tablet Take 20 mg by mouth daily.   pantoprazole  (PROTONIX ) 40 MG tablet Take 40 mg by mouth daily.   rivaroxaban  (XARELTO ) 20 MG TABS tablet Take 1 tablet (20 mg total) by mouth daily with supper.   rosuvastatin  (CRESTOR ) 20 MG tablet Take 20 mg by mouth every morning.   sodium bicarbonate  650 MG  tablet Take 650 mg by mouth 2 (two) times daily.   traMADol  (ULTRAM ) 50 MG tablet Take 1 tablet (50 mg total) by mouth every 12 (twelve) hours as needed for severe pain (pain score 7-10). Hold your mirtazapine  if you take this medication.   TRESIBA FLEXTOUCH 200 UNIT/ML SOPN Inject 25-30 Units as directed at bedtime. Titrate according to fasting blood glucose not to exceed 50 units a day   vitamin B-12 (CYANOCOBALAMIN ) 1000 MCG tablet Take 1,000 mcg by mouth daily.   vitamin C  (ASCORBIC ACID ) 250 MG tablet Take 250 mg by mouth daily.   vitamin E  400 UNIT capsule Take 400 Units by mouth daily.   zolpidem  (AMBIEN ) 10 MG tablet Take 10 mg by mouth at  bedtime.   No current facility-administered medications for this visit. (Other)   REVIEW OF SYSTEMS: ROS   Positive for: Gastrointestinal, Musculoskeletal, Endocrine, Cardiovascular, Eyes, Respiratory Negative for: Constitutional, Neurological, Skin, Genitourinary, HENT, Psychiatric, Allergic/Imm, Heme/Lymph Last edited by Resa Delon ORN, COT on 12/13/2023 12:33 PM.      ALLERGIES Allergies  Allergen Reactions   Ace Inhibitors Other (See Comments)    Unknown  Other Reaction(s): Intolerance   PAST MEDICAL HISTORY Past Medical History:  Diagnosis Date   Anemia    Anxiety    Arthritis    Gout   Cataracts, both eyes    Diabetic retinopathy (HCC)    NPDR OU   Diverticulitis    GERD (gastroesophageal reflux disease)    Gout    Headache    h/o migraines   History of fracture of patella    right knee   History of positive PPD    Patient always shows positive   Hyperlipidemia    Hypertension    Hypertensive retinopathy    OU   Hypothyroidism    Lichen sclerosus 12/30/2013   of vulva   Metatarsal fracture    Nausea and vomiting 04/11/2023   Neuropathy    Osteopenia    Peripheral vascular disease    Polyneuropathy    numbness and tingling in feet and toes   Renal insufficiency    Stage 3   Sleep apnea    does not use cpap-lost weight    Type 2 diabetes mellitus, uncontrolled    Past Surgical History:  Procedure Laterality Date   ABDOMINAL HYSTERECTOMY     AMPUTATION TOE Right 05/08/2020   Procedure: AMPUTATION TOE-Right 4th Toe;  Surgeon: Lennie Barter, DPM;  Location: [facility];  Service: Podiatry;  Laterality: Right;   AMPUTATION TOE Left 04/22/2021   Procedure: AMPUTATION TOE - 4TH METARSOPHANGEAL JOINT;  Surgeon: Lennie Barter, DPM;  Location: [facility];  Service: Podiatry;  Laterality: Left;   APPENDECTOMY     BREAST REDUCTION SURGERY  2001   CATARACT EXTRACTION     CESAREAN SECTION  1976   COLONOSCOPY  03/05/2013   Nml - due for repeat 03/06/2018    COLONOSCOPY WITH PROPOFOL  N/A 03/18/2019   Procedure: COLONOSCOPY WITH PROPOFOL ;  Surgeon: Toledo, Ladell POUR, MD;  Location: [facility];  Service: Gastroenterology;  Laterality: N/A;   DIAGNOSTIC LAPAROSCOPY     DILATION AND CURETTAGE OF UTERUS  1989   ENDARTERECTOMY FEMORAL Bilateral 03/09/2018   Procedure: ENDARTERECTOMY FEMORAL;  Surgeon: Marea Selinda RAMAN, MD;  Location: [facility];  Service: Vascular;  Laterality: Bilateral;   ENDARTERECTOMY POPLITEAL Left 03/09/2018   Procedure: ENDARTERECTOMY POPLITEAL AND SFA;  Surgeon: Marea Selinda RAMAN, MD;  Location: [facility];  Service: Vascular;  Laterality: Left;  ESOPHAGOGASTRODUODENOSCOPY  03/05/2013   ESOPHAGOGASTRODUODENOSCOPY (EGD) WITH PROPOFOL  N/A 03/18/2019   Procedure: ESOPHAGOGASTRODUODENOSCOPY (EGD) WITH PROPOFOL ;  Surgeon: Toledo, Ladell POUR, MD;  Location: [facility];  Service: Gastroenterology;  Laterality: N/A;   EYE SURGERY     Eyelid Surgery  2012   INTRAMEDULLARY (IM) NAIL INTERTROCHANTERIC Left 10/30/2015   Procedure: INTRAMEDULLARY (IM) NAIL INTERTROCHANTRIC ;  Surgeon: Ozell Flake, MD;  Location: [facility];  Service: Orthopedics;  Laterality: Left;   KYPHOPLASTY N/A 10/25/2018   Procedure: L4 KYPHOPLASTY;  Surgeon: Flake Ozell, MD;  Location: [facility];  Service: Orthopedics;  Laterality: N/A;   LAPAROSCOPIC HYSTERECTOMY  2000   total   LOWER EXTREMITY ANGIOGRAPHY Left 03/08/2017   Procedure: LOWER EXTREMITY ANGIOGRAPHY;  Surgeon: Marea Selinda RAMAN, MD;  Location: [facility];  Service: Cardiovascular;  Laterality: Left;   LOWER EXTREMITY ANGIOGRAPHY Left 10/30/2017   Procedure: LOWER EXTREMITY ANGIOGRAPHY;  Surgeon: Marea Selinda RAMAN, MD;  Location: [facility];  Service: Cardiovascular;  Laterality: Left;   LOWER EXTREMITY ANGIOGRAPHY Right 03/08/2018   Procedure: LOWER EXTREMITY ANGIOGRAPHY;  Surgeon: Marea Selinda RAMAN, MD;  Location: [facility];  Service: Cardiovascular;  Laterality: Right;   LOWER EXTREMITY  ANGIOGRAPHY Left 10/01/2018   Procedure: LOWER EXTREMITY ANGIOGRAPHY;  Surgeon: Marea Selinda RAMAN, MD;  Location: [facility];  Service: Cardiovascular;  Laterality: Left;   LOWER EXTREMITY ANGIOGRAPHY Right 10/08/2018   Procedure: LOWER EXTREMITY ANGIOGRAPHY;  Surgeon: Marea Selinda RAMAN, MD;  Location: [facility];  Service: Cardiovascular;  Laterality: Right;   LOWER EXTREMITY ANGIOGRAPHY Right 05/07/2020   Procedure: Lower Extremity Angiography;  Surgeon: Marea Selinda RAMAN, MD;  Location: [facility];  Service: Cardiovascular;  Laterality: Right;   LYSIS OF ADHESION  01/25/2021   Procedure: LYSIS OF ADHESION;  Surgeon: Lane Shope, MD;  Location: [facility];  Service: General;;   PERIPHERAL VASCULAR INTERVENTION  03/08/2018   Procedure: PERIPHERAL VASCULAR INTERVENTION;  Surgeon: Marea Selinda RAMAN, MD;  Location: [facility];  Service: Cardiovascular;;   PORTA CATH INSERTION N/A 02/17/2020   Procedure: PORTA CATH INSERTION;  Surgeon: Marea Selinda RAMAN, MD;  Location: [facility];  Service: Cardiovascular;  Laterality: N/A;   REDUCTION MAMMAPLASTY  1997   SACROPLASTY N/A 10/25/2018   Procedure: S1 SACROPLASTY;  Surgeon: Flake Ozell, MD;  Location: [facility];  Service: Orthopedics;  Laterality: N/A;   FAMILY HISTORY Family History  Problem Relation Age of Onset   Coronary artery disease Father    Heart attack Father    Coronary artery disease Mother    Heart attack Mother    Ovarian cancer Sister 66       sister had hormonal therapy for IVF txs-which increased risk factor for ovarian cancer   Breast cancer Neg Hx    SOCIAL HISTORY Social History   Tobacco Use   Smoking status: Former    Current packs/day: 0.00    Average packs/day: 1 pack/day for 20.0 years (20.0 ttl pk-yrs)    Types: Cigarettes    Start date: 03/07/1976    Quit date: 03/07/1996    Years since quitting: 27.7    Passive exposure: Past   Smokeless tobacco: Never   Tobacco comments:     started smoking at age 20 but stopped smoking in 2000  Vaping Use   Vaping status: Never Used  Substance Use Topics   Alcohol use: No    Alcohol/week: 0.0 standard drinks of alcohol   Drug  use: No       OPHTHALMIC EXAM: Base Eye Exam     Visual Acuity (Snellen - Linear)       Right Left   Dist [place] 20/30 -1 20/25 +1   Dist ph [place] NI NI         Tonometry (Tonopen, 12:37 PM)       Right Left   Pressure 18 18         Pupils       Pupils Dark Light Shape React APD   Right PERRL 3 2 Round Brisk None   Left PERRL 3 2 Round Brisk None         Visual Fields       Left Right    Full Full         Extraocular Movement       Right Left    Full, Ortho Full, Ortho         Neuro/Psych     Oriented x3: Yes   Mood/Affect: Normal         Dilation     Both eyes: 2.5% Phenylephrine  @ 12:37 PM           Slit Lamp and Fundus Exam     External Exam       Right Left   External Normal Normal         Slit Lamp Exam       Right Left   Lids/Lashes dermatochalasis dermatochalasis   Conjunctiva/Sclera White and quiet Trace Injection   Cornea arcus; well healed cataract wound; trace Punctate epithelial erosions arcus; well healed cataract wound, trace PEE   Anterior Chamber Deep and quiet Deep and quiet   Iris Round and dilated Round and dilated   Lens PCIOL; open PC PCIOL; open PC   Anterior Vitreous syneresis, Posterior vitreous detachment, vitreous condensations inferiorly syneresis, Posterior vitreous detachment         Fundus Exam       Right Left   Disc Superior hyperemia -- stably improved, mild Pallor, vascular loops Pink and Sharp   C/D Ratio 0.6 0.5   Macula Flat, Blunted foveal reflex, +cystic changes centrally-- improved, +Epiretinal membrane, minimal MA flat; good foveal reflex, no heme or edema, small pigment clump IT to fovea   Vessels attenuated, Tortuous attenuated, Tortuous   Periphery Attached; scattered DBH -- greastest temporal  periphery- improved Attached, no heme           IMAGING AND PROCEDURES  Imaging and Procedures for 04/25/17  OCT, Retina - OU - Both Eyes       Right Eye Quality was borderline. Central Foveal Thickness: 312. Progression has improved. Findings include no IRF, no SRF, abnormal foveal contour, epiretinal membrane, intraretinal fluid (interval improvement in IRF / cystic changes temporal fovea).   Left Eye Quality was borderline. Central Foveal Thickness: 288. Progression has been stable. Findings include normal foveal contour, no IRF, no SRF.   Notes *Images captured and stored on drive  Diagnosis / Impression:  OD: interval improvement in IRF / cystic changes temporal fovea OS: NFP; no IRF/SRF--stable  Clinical management:  See below  Abbreviations: NFP - Normal foveal profile. CME - cystoid macular edema. PED - pigment epithelial detachment. IRF - intraretinal fluid. SRF - subretinal fluid. EZ - ellipsoid zone. ERM - epiretinal membrane. ORA - outer retinal atrophy. ORT - outer retinal tubulation. SRHM - subretinal hyper-reflective material      Intravitreal Injection, Pharmacologic Agent - OD -  Right Eye       Time Out 12/13/2023. 1:27 PM. Confirmed correct patient, procedure, site, and patient consented.   Anesthesia Topical anesthesia was used. Anesthetic medications included Lidocaine  2%, Proparacaine 0.5%.   Procedure Preparation included 5% betadine to ocular surface, eyelid speculum. A supplied (30 g) needle was used.   Injection: 6 mg faricimab -svoa 6 MG/0.05ML   Route: Intravitreal, Site: Right Eye   NDC: 49757-903-93, Lot: [id number], Expiration date: 02/02/2025, Waste: 0 mL   Post-op Post injection exam found visual acuity of at least counting fingers. The patient tolerated the procedure well. There were no complications. The patient received written and verbal post procedure care education. Post injection medications were not given.              ASSESSMENT/PLAN:    ICD-10-CM   1. Branch retinal vein occlusion of right eye with macular edema (HCC)  H34.8310 OCT, Retina - OU - Both Eyes    Intravitreal Injection, Pharmacologic Agent - OD - Right Eye    faricimab -svoa (VABYSMO ) 6mg /0.24mL intravitreal injection    2. Both eyes affected by mild nonproliferative diabetic retinopathy with macular edema, associated with type 2 diabetes mellitus (HCC)  Z88.6786     3. Long term (current) use of oral hypoglycemic drugs  Z79.84     4. Current use of insulin  (HCC)  Z79.4     5. Essential hypertension  I10     6. Hypertensive retinopathy of both eyes  H35.033     7. Epiretinal membrane (ERM) of right eye  H35.371     8. Pseudophakia of both eyes  Z96.1      1. BRVO w/ CME OD - by history, pt states symptoms first noticed 2 wks prior to presentation, but reports changes may have occurred prior  - initial exam with differential tortuosity of vessels (OD > OS) - FA (02.10.20) shows mild late staining / leakage in macula, staining / leakage of disc -- improving CME - differential includes DM2 (DME), hypertensive retinopathy, inflammatory etiology / uveitis - S/P IVA OD #1 (02.08.19), #2 (03.11.19), #3 (04.09.19), #4 (05.20.19), #5 (02.10.20) - gave IVA OD on 2.10.20 due to pending Eylea4U for 2020 -- resulted in increased IRF/CME - review of OCT show persistent IRF and cystic changes  --  resistance to IVA  - June 2019 -- switched therapies:  ============================== - S/P IVE OD #1 (06.24.19), #2 (07.24.19), #3 (09.04.19), #4 (10.30.19),#5 (12.30.19), #6 (03.23.20), #7 (05.05.20), #8 (07.16.20), #9 (07.17.20), #10 (08.28.20), #11 (10.13.20), # 12 (11.17.20), #13 (2.8.21), #14 (03.09.21), #15 (04.13.21), #16 (05.11.21), #17 (06.17.21), #18 (07.23.21), #19 (08.30.21), #20 (10.04.21), #21 (11.08.21), #22 (12.08.21), #23 (01.31.22), #24 (02.28.22), #25 (04.01.22), #26 (06.15.22), #27 (07.13.22), #28 (08.17.22), #29 (09.21.22), #30  (10.19.22), #31 (11.16.22), #32 (12.16.22), #33 (01.13.23), #34 (02.10.23), #35 (03.15.23), #36 (04.14.23), #37 (05.15.23), #38 (06.12.23), #39 (07.10.23), #40 (08.07.23), #41 (09.06.23), #42 (10.04.23), #43 (11.01.23)  -- IVE resistance ================================ **interval increase in IRF at 5 weeks on 07.01.24 and at 6 wks on 02.28.25 (IVV)** - s/p IVV OD #1 (12.04.23), #2 (01.03.24), #3 (01.31.24), #4 (02.28.24), #5 (03.29.24), #6 (04.24.24), #7 (05.28.24), #8 (07.01.24), #9 (07.29.24), #10 (08.26.24), #11 (09.26.24), #12 (10.25.24), #13 (11.25.24), #14 (01.07.24), #15 (02.18.25), #16 (06.25.25), #17 (07.23.25), #18 (08.20.25), #19 (09.17.25), #20 (10.15.25), #21 (11.12.25) - s/p IVE HD OD #1 (03.25.25), #2 (04.29.25), #3 (05.27.25) - OCT today shows interval improvement in IRF / cystic changes temporal fovea at 4 wks  - BCVA OD 20/30 - stable   -  recommend IVV OD #22 today, (12.10.25) w/ f/u in 4-5 weeks  - RBA of procedure discussed, questions answered  - IVV informed consent obtained and signed, 06.25.25 (OD)  - see procedure note  - f/u in 4-5 weeks  -- DFE/OCT/possible injection  2-4. Mild nonproliferative diabetic retinopathy, both eyes  - s/p IVE HD OD #1 (03.25.25), #2 (04.29.25), #3 (05.27.25) - A1c 5.5 (10.08.25), 5.6 (8.14.25), 7.3 (04.04.25)  - likely DME component contributing to CME OD  - OD Interval improvement in IRF / cystic changes temporal fovea   - OS with minimal diabetic retinopathy - Eylea  HD informed consent form obtained, signed and scanned on 03.25.25  - pt approved for Eylea  HD through Medicare and supplement  - monitor OS  - f/u 4-5 weeks, DFE, OCT  5,6. Hypertensive retinopathy OU - stable  - as above, may have contributing to CME OD  - discussed importance of tight BP control  - monitor  7. Epiretinal membrane, right eye   - stable nasal ERM  - no indication for surgery at this time  8. Pseudophakia OU  - s/p CE[facility]IOL O[facility]  - doing well  - monitor  Ophthalmic Meds Ordered this visit:  Meds ordered this encounter  Medications   faricimab -svoa (VABYSMO ) 6mg /0.76mL intravitreal injection     Return in about 5 weeks (around 01/17/2024) for f/u, BRVO, DFE, OCT, Possible Injxn.  This document serves as a record of services personally performed by Redell JUDITHANN Hans, MD, PhD. It was created on their behalf by Almetta Pesa, an ophthalmic technician. The creation of this record is the provider's dictation and/or activities during the visit.    Electronically signed by: Almetta Pesa, OA, 12/13/23  9:14 PM  This document serves as a record of services personally performed by Redell JUDITHANN Hans, MD, PhD. It was created on their behalf by Wanda GEANNIE Keens, COT an ophthalmic technician. The creation of this record is the provider's dictation and/or activities during the visit.    Electronically signed by:  Wanda GEANNIE Keens, COT  12/13/23 9:14 PM  Redell JUDITHANN Hans, M.D., Ph.D. Diseases & Surgery of the Retina and Vitreous [facility]  I have reviewed the above documentation for accuracy and completeness, and I agree with the above. Redell JUDITHANN Hans, M.D., Ph.D. 12/13/23 9:15 PM    Abbreviations: M myopia (nearsighted); A astigmatism; H hyperopia (farsighted); P presbyopia; Mrx spectacle prescription;  CTL contact lenses; OD right eye; OS left eye; OU both eyes  XT exotropia; ET esotropia; PEK punctate epithelial keratitis; PEE punctate epithelial erosions; DES dry eye syndrome; MGD meibomian gland dysfunction; ATs artificial tears; PFAT's preservative free artificial tears; NSC nuclear sclerotic cataract; PSC posterior subcapsular cataract; ERM epi-retinal membrane; PVD posterior vitreous detachment; RD retinal detachment; DM diabetes mellitus; DR diabetic retinopathy; NPDR non-proliferative diabetic retinopathy; PDR proliferative diabetic retinopathy; CSME clinically significant macular  edema; DME diabetic macular edema; dbh dot blot hemorrhages; CWS cotton wool spot; POAG primary open angle glaucoma; C/D cup-to-disc ratio; HVF humphrey visual field; GVF goldmann visual field; OCT optical coherence tomography; IOP intraocular pressure; BRVO Branch retinal vein occlusion; CRVO central retinal vein occlusion; CRAO central retinal artery occlusion; BRAO branch retinal artery occlusion; RT retinal tear; SB scleral buckle; PPV pars plana vitrectomy; VH Vitreous hemorrhage; PRP panretinal laser photocoagulation; IVK intravitreal kenalog; VMT vitreomacular traction; MH Macular hole;  NVD neovascularization of the disc; NVE neovascularization elsewhere; AREDS age related eye disease study; ARMD age  related macular degeneration; POAG primary open angle glaucoma; EBMD epithelial/anterior basement membrane dystrophy; ACIOL anterior chamber intraocular lens; IOL intraocular lens; PCIOL posterior chamber intraocular lens; Phaco/IOL phacoemulsification with intraocular lens placement; PRK photorefractive keratectomy; LASIK laser assisted in situ keratomileusis; HTN hypertension; DM diabetes mellitus; COPD chronic obstructive pulmonary disease

## 2023-12-13 ENCOUNTER — Ambulatory Visit (INDEPENDENT_AMBULATORY_CARE_PROVIDER_SITE_OTHER): Admitting: Ophthalmology

## 2023-12-13 ENCOUNTER — Encounter (INDEPENDENT_AMBULATORY_CARE_PROVIDER_SITE_OTHER): Payer: Self-pay | Admitting: Ophthalmology

## 2023-12-13 DIAGNOSIS — H34831 Tributary (branch) retinal vein occlusion, right eye, with macular edema: Secondary | ICD-10-CM | POA: Diagnosis not present

## 2023-12-13 DIAGNOSIS — Z7984 Long term (current) use of oral hypoglycemic drugs: Secondary | ICD-10-CM

## 2023-12-13 DIAGNOSIS — I10 Essential (primary) hypertension: Secondary | ICD-10-CM

## 2023-12-13 DIAGNOSIS — Z794 Long term (current) use of insulin: Secondary | ICD-10-CM

## 2023-12-13 DIAGNOSIS — H35371 Puckering of macula, right eye: Secondary | ICD-10-CM

## 2023-12-13 DIAGNOSIS — E113213 Type 2 diabetes mellitus with mild nonproliferative diabetic retinopathy with macular edema, bilateral: Secondary | ICD-10-CM

## 2023-12-13 DIAGNOSIS — Z961 Presence of intraocular lens: Secondary | ICD-10-CM

## 2023-12-13 DIAGNOSIS — H35033 Hypertensive retinopathy, bilateral: Secondary | ICD-10-CM

## 2023-12-13 MED ORDER — FARICIMAB-SVOA 6 MG/0.05ML IZ SOSY
6.0000 mg | PREFILLED_SYRINGE | INTRAVITREAL | Status: AC | PRN
  Administered 2023-12-13: 6 mg via INTRAVITREAL

## 2023-12-18 ENCOUNTER — Inpatient Hospital Stay: Attending: Internal Medicine | Admitting: Internal Medicine

## 2023-12-18 ENCOUNTER — Encounter: Payer: Self-pay | Admitting: Internal Medicine

## 2023-12-18 ENCOUNTER — Inpatient Hospital Stay

## 2023-12-18 ENCOUNTER — Inpatient Hospital Stay: Attending: Internal Medicine

## 2023-12-18 VITALS — BP 103/76 | HR 70 | Temp 97.7°F | Resp 16 | Ht 65.0 in | Wt 175.4 lb

## 2023-12-18 VITALS — BP 150/70 | HR 72

## 2023-12-18 DIAGNOSIS — N183 Chronic kidney disease, stage 3 unspecified: Secondary | ICD-10-CM | POA: Insufficient documentation

## 2023-12-18 DIAGNOSIS — D631 Anemia in chronic kidney disease: Secondary | ICD-10-CM | POA: Insufficient documentation

## 2023-12-18 DIAGNOSIS — D649 Anemia, unspecified: Secondary | ICD-10-CM | POA: Diagnosis not present

## 2023-12-18 LAB — IRON AND TIBC
Iron: 309 ug/dL — ABNORMAL HIGH (ref 28–170)
Saturation Ratios: 78 % — ABNORMAL HIGH (ref 10.4–31.8)
TIBC: 396 ug/dL (ref 250–450)
UIBC: 87 ug/dL

## 2023-12-18 LAB — BASIC METABOLIC PANEL - CANCER CENTER ONLY
Anion gap: 11 (ref 5–15)
BUN: 41 mg/dL — ABNORMAL HIGH (ref 8–23)
CO2: 24 mmol/L (ref 22–32)
Calcium: 10.2 mg/dL (ref 8.9–10.3)
Chloride: 102 mmol/L (ref 98–111)
Creatinine: 1.75 mg/dL — ABNORMAL HIGH (ref 0.44–1.00)
GFR, Estimated: 29 mL/min — ABNORMAL LOW (ref >60)
Glucose, Bld: 177 mg/dL — ABNORMAL HIGH (ref 70–99)
Potassium: 4.8 mmol/L (ref 3.5–5.1)
Sodium: 137 mmol/L (ref 135–145)

## 2023-12-18 LAB — CBC WITH DIFFERENTIAL (CANCER CENTER ONLY)
Abs Immature Granulocytes: 0.03 K/uL (ref 0.00–0.07)
Basophils Absolute: 0.1 K/uL (ref 0.0–0.1)
Basophils Relative: 1 %
Eosinophils Absolute: 0.2 K/uL (ref 0.0–0.5)
Eosinophils Relative: 4 %
HCT: 32.4 % — ABNORMAL LOW (ref 36.0–46.0)
Hemoglobin: 10.6 g/dL — ABNORMAL LOW (ref 12.0–15.0)
Immature Granulocytes: 0 %
Lymphocytes Relative: 17 %
Lymphs Abs: 1.2 K/uL (ref 0.7–4.0)
MCH: 30.4 pg (ref 26.0–34.0)
MCHC: 32.7 g/dL (ref 30.0–36.0)
MCV: 92.8 fL (ref 80.0–100.0)
Monocytes Absolute: 0.9 K/uL (ref 0.1–1.0)
Monocytes Relative: 12 %
Neutro Abs: 4.6 K/uL (ref 1.7–7.7)
Neutrophils Relative %: 66 %
Platelet Count: 282 K/uL (ref 150–400)
RBC: 3.49 MIL/uL — ABNORMAL LOW (ref 3.87–5.11)
RDW: 14.4 % (ref 11.5–15.5)
WBC Count: 6.9 K/uL (ref 4.0–10.5)
nRBC: 0 % (ref 0.0–0.2)

## 2023-12-18 LAB — FERRITIN: Ferritin: 24 ng/mL (ref 11–307)

## 2023-12-18 MED ORDER — IRON SUCROSE 20 MG/ML IV SOLN
200.0000 mg | Freq: Once | INTRAVENOUS | Status: AC
  Administered 2023-12-18: 15:00:00 200 mg via INTRAVENOUS
  Filled 2023-12-18: qty 10

## 2023-12-18 NOTE — Progress Notes (Signed)
 Fatigue/weakness: NO Dizziness: NO  Pale skin: NO  SOB: NO Rapid heartbeat: NO

## 2023-12-18 NOTE — Patient Instructions (Signed)

## 2023-12-18 NOTE — Assessment & Plan Note (Addendum)
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  If Retacrit /iron  does not improve consider bone marrow biopsy. On gentle iron  [iron  biglycinate; 28 mg ]1 pill a day.    #  TODAY Hb 10.4-- HOLD retacrit ; Proceed with venofer .   # Etiology-CKD-III--IV; overall worsened [Dr.Kolluru];- stable.   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  #Poor IV access/Mediport placement- stable.   Schedule retacrit /venofer  on separate days  # DISPOSITION:  # Venofer  today; NO retacrit .   # in 2 months- port/ labs- H&H- possible retacrit .   # follow up 4 months  MD; port; labs- cbc/bmp; iron  studies;ferritin; possible retacrit  OR venofer - Dr.B

## 2023-12-18 NOTE — Progress Notes (Signed)
 [name] CONSULT NOTE  Patient Care Team: Cleotilde Oneil FALCON, MD as PCP - General (Internal Medicine) Jeri Donnice Burkes, MD as Referring Physician (Gastroenterology) Rennie Cindy SAUNDERS, MD as Consulting Physician (Oncology)  CHIEF COMPLAINTS/PURPOSE OF CONSULTATION: Anemia  HEMATOLOGY HISTORY  # ANEMIA- Jan 2021- 8.8/ferritin 11 [PCP]; N-WBC/platelets? IDA vs other- EGD-2015/colonoscopy-? 2015; 2020- [Dr.Skulskie] ; capsule-2016- ? 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   HISTORY OF PRESENTING ILLNESS: Ambulating walking with a cane.  Alone.    Cheryl Leonard 78 y.o.  female anemia iron  deficiency-question CKD-III  here for follow-up.   Discussed the use of AI scribe software for clinical note transcription with the patient, who gave verbal consent to proceed.  History of Present Illness   Cheryl Leonard is a 78 year old female with chronic anemia who presents for hematology follow-up to monitor hemoglobin levels.  Cheryl Leonard has chronic anemia with hemoglobin levels monitored regularly. Her most recent hemoglobin is 10.6 g/dL, representing a slight decrease from prior values of 11 and 11.1 g/dL since June. The lowest recent value was in April. Over the past six months, her hemoglobin has trended downward after a period of stability.  Cheryl Leonard previously received infusions, with the last documented dose at 17. Cheryl Leonard has been receiving treatments approximately every two months.  Cheryl Leonard continues to work during the holidays.     Review of Systems  Constitutional:  Positive for malaise/fatigue. Negative for chills, diaphoresis and fever.  HENT:  Negative for nosebleeds and sore throat.   Eyes:  Negative for double vision.  Respiratory:  Positive for shortness of breath. Negative for cough,  hemoptysis, sputum production and wheezing.   Cardiovascular:  Negative for chest pain, palpitations, orthopnea and leg swelling.  Gastrointestinal:  Negative for abdominal pain, blood in stool, constipation, diarrhea, heartburn, melena, nausea and vomiting.  Genitourinary:  Negative for dysuria, frequency and urgency.  Musculoskeletal:  Positive for joint pain.  Skin: Negative.  Negative for itching and rash.  Neurological:  Negative for dizziness, tingling, focal weakness, weakness and headaches.  Endo/Heme/Allergies:  Does not bruise/bleed easily.  Psychiatric/Behavioral:  Negative for depression. The patient is not nervous/anxious and does not have insomnia.     MEDICAL HISTORY:  Past Medical History:  Diagnosis Date   Anemia    Anxiety    Arthritis    Gout   Cataracts, both eyes    Diabetic retinopathy (HCC)    NPDR OU   Diverticulitis    GERD (gastroesophageal reflux disease)    Gout    Headache    h/o migraines   History of fracture of patella    right knee   History of positive PPD    Patient always shows positive   Hyperlipidemia    Hypertension    Hypertensive retinopathy    OU   Hypothyroidism    Lichen sclerosus 12/30/2013   of vulva   Metatarsal fracture    Nausea and vomiting 04/11/2023   Neuropathy    Osteopenia    Peripheral vascular disease    Polyneuropathy    numbness and tingling in feet and toes   Renal insufficiency    Stage 3   Sleep apnea    does not use cpap-lost weight    Type 2 diabetes mellitus, uncontrolled     SURGICAL HISTORY: Past Surgical History:  Procedure Laterality Date   ABDOMINAL HYSTERECTOMY     AMPUTATION TOE Right 05/08/2020   Procedure: AMPUTATION TOE-Right 4th Toe;  Surgeon: Lennie Barter, DPM;  Location: [facility];  Service: Podiatry;  Laterality: Right;   AMPUTATION TOE Left 04/22/2021   Procedure: AMPUTATION TOE - 4TH METARSOPHANGEAL JOINT;  Surgeon: Lennie Barter, DPM;  Location: [facility];  Service: Podiatry;   Laterality: Left;   APPENDECTOMY     BREAST REDUCTION SURGERY  2001   CATARACT EXTRACTION     CESAREAN SECTION  1976   COLONOSCOPY  03/05/2013   Nml - due for repeat 03/06/2018   COLONOSCOPY WITH PROPOFOL  N/A 03/18/2019   Procedure: COLONOSCOPY WITH PROPOFOL ;  Surgeon: Toledo, Ladell POUR, MD;  Location: [facility];  Service: Gastroenterology;  Laterality: N/A;   DIAGNOSTIC LAPAROSCOPY     DILATION AND CURETTAGE OF UTERUS  1989   ENDARTERECTOMY FEMORAL Bilateral 03/09/2018   Procedure: ENDARTERECTOMY FEMORAL;  Surgeon: Marea Selinda RAMAN, MD;  Location: [facility];  Service: Vascular;  Laterality: Bilateral;   ENDARTERECTOMY POPLITEAL Left 03/09/2018   Procedure: ENDARTERECTOMY POPLITEAL AND SFA;  Surgeon: Marea Selinda RAMAN, MD;  Location: [facility];  Service: Vascular;  Laterality: Left;   ESOPHAGOGASTRODUODENOSCOPY  03/05/2013   ESOPHAGOGASTRODUODENOSCOPY (EGD) WITH PROPOFOL  N/A 03/18/2019   Procedure: ESOPHAGOGASTRODUODENOSCOPY (EGD) WITH PROPOFOL ;  Surgeon: Toledo, Ladell POUR, MD;  Location: [facility];  Service: Gastroenterology;  Laterality: N/A;   EYE SURGERY     Eyelid Surgery  2012   INTRAMEDULLARY (IM) NAIL INTERTROCHANTERIC Left 10/30/2015   Procedure: INTRAMEDULLARY (IM) NAIL INTERTROCHANTRIC ;  Surgeon: Ozell Flake, MD;  Location: [facility];  Service: Orthopedics;  Laterality: Left;   KYPHOPLASTY N/A 10/25/2018   Procedure: L4 KYPHOPLASTY;  Surgeon: Flake Ozell, MD;  Location: [facility];  Service: Orthopedics;  Laterality: N/A;   LAPAROSCOPIC HYSTERECTOMY  2000   total   LOWER EXTREMITY ANGIOGRAPHY Left 03/08/2017   Procedure: LOWER EXTREMITY ANGIOGRAPHY;  Surgeon: Marea Selinda RAMAN, MD;  Location: [facility];  Service: Cardiovascular;  Laterality: Left;   LOWER EXTREMITY ANGIOGRAPHY Left 10/30/2017   Procedure: LOWER EXTREMITY ANGIOGRAPHY;  Surgeon: Marea Selinda RAMAN, MD;  Location: [facility];  Service: Cardiovascular;  Laterality: Left;   LOWER EXTREMITY ANGIOGRAPHY  Right 03/08/2018   Procedure: LOWER EXTREMITY ANGIOGRAPHY;  Surgeon: Marea Selinda RAMAN, MD;  Location: [facility];  Service: Cardiovascular;  Laterality: Right;   LOWER EXTREMITY ANGIOGRAPHY Left 10/01/2018   Procedure: LOWER EXTREMITY ANGIOGRAPHY;  Surgeon: Marea Selinda RAMAN, MD;  Location: [facility];  Service: Cardiovascular;  Laterality: Left;   LOWER EXTREMITY ANGIOGRAPHY Right 10/08/2018   Procedure: LOWER EXTREMITY ANGIOGRAPHY;  Surgeon: Marea Selinda RAMAN, MD;  Location: [facility];  Service: Cardiovascular;  Laterality: Right;   LOWER EXTREMITY ANGIOGRAPHY Right 05/07/2020   Procedure: Lower Extremity Angiography;  Surgeon: Marea Selinda RAMAN, MD;  Location: [facility];  Service: Cardiovascular;  Laterality: Right;   LYSIS OF ADHESION  01/25/2021   Procedure: LYSIS OF ADHESION;  Surgeon: Lane Shope, MD;  Location: [facility];  Service: General;;   PERIPHERAL VASCULAR INTERVENTION  03/08/2018   Procedure: PERIPHERAL VASCULAR INTERVENTION;  Surgeon: Marea Selinda RAMAN, MD;  Location: [facility];  Service: Cardiovascular;;   PORTA CATH INSERTION N/A 02/17/2020   Procedure: PORTA CATH INSERTION;  Surgeon: Marea Selinda RAMAN, MD;  Location: [facility];  Service: Cardiovascular;  Laterality: N/A;   REDUCTION MAMMAPLASTY  1997   SACROPLASTY N/A 10/25/2018  Procedure: S1 SACROPLASTY;  Surgeon: Kathlynn Sharper, MD;  Location: [facility];  Service: Orthopedics;  Laterality: N/A;    SOCIAL HISTORY: Social History   Socioeconomic History   Marital status: Married    Spouse name: [name]   Number of children: 3   Years of education: Not on file   Highest education level: Not on file  Occupational History   Occupation: barrister's clerk  Tobacco Use   Smoking status: Former    Current packs/day: 0.00    Average packs/day: 1 pack/day for 20.0 years (20.0 ttl pk-yrs)    Types: Cigarettes    Start date: 03/07/1976    Quit date: 03/07/1996    Years since quitting:  27.8    Passive exposure: Past   Smokeless tobacco: Never   Tobacco comments:    started smoking at age 84 but stopped smoking in 2000  Vaping Use   Vaping status: Never Used  Substance and Sexual Activity   Alcohol use: No    Alcohol/week: 0.0 standard drinks of alcohol   Drug use: No   Sexual activity: Yes    Partners: Male    Birth control/protection: Surgical  Other Topics Concern   Not on file  Social History Narrative   Lives in [place]; with husband; quit > 20 years; no alcohol; used to work at [employer]  at [employer].    Social Drivers of Health   Tobacco Use: Medium Risk (12/18/2023)   Patient History    Smoking Tobacco Use: Former    Smokeless Tobacco Use: Never    Passive Exposure: Past  Physicist, Medical Strain: Low Risk  (10/18/2023)   Received from [facility]   Overall Financial Resource Strain (CARDIA)    Difficulty of Paying Living Expenses: Not hard at all  Food Insecurity: No Food Insecurity (10/18/2023)   Received from [facility]   Epic    Within the past 12 months, you worried that your food would run out before you got the money to buy more.: Never true    Within the past 12 months, the food you bought just didn't last and you didn't have money to get more.: Never true  Transportation Needs: No Transportation Needs (10/18/2023)   Received from [facility] - Transportation    In the past 12 months, has lack of transportation kept you from medical appointments or from getting medications?: No    Lack of Transportation (Non-Medical): No  Physical Activity: Not on file  Stress: Not on file  Social Connections: Moderately Integrated (04/07/2023)   Social Connection and Isolation Panel    Frequency of Communication with Friends and Family: More than three times a week    Frequency of Social Gatherings with Friends and Family: More than three times a week    Attends Religious Services:  More than 4 times per year    Active Member of [employer] or Organizations: No    Attends Banker Meetings: Never    Marital Status: Married  Catering Manager Violence: Not At Risk (04/07/2023)   Humiliation, Afraid, Rape, and Kick questionnaire    Fear of Current or Ex-Partner: No    Emotionally Abused: No    Physically Abused: No    Sexually Abused: No  Depression (PHQ2-9): Low Risk (12/18/2023)   Depression (PHQ2-9)    PHQ-2 Score: 0  Alcohol Screen: Not on file  Housing: Low Risk  (10/18/2023)   Received [facility]  Epic    In the last 12 months, was there a time when you were not able to pay the mortgage or rent on time?: No    In the past 12 months, how many times have you moved where you were living?: 0    At any time in the past 12 months, were you homeless or living in a shelter (including now)?: No  Utilities: Not At Risk (10/18/2023)   Received from [facility]   Epic    In the past 12 months has the electric, gas, oil, or water company threatened to shut off services in your home?: No  Health Literacy: Not on file    FAMILY HISTORY: Family History  Problem Relation Age of Onset   Coronary artery disease Father    Heart attack Father    Coronary artery disease Mother    Heart attack Mother    Ovarian cancer Sister 94       sister had hormonal therapy for IVF txs-which increased risk factor for ovarian cancer   Breast cancer Neg Hx     ALLERGIES:  is allergic to ace inhibitors.  MEDICATIONS:  Current Outpatient Medications  Medication Sig Dispense Refill   ALPRAZolam  (XANAX ) 0.25 MG tablet Take 0.25 mg by mouth daily as needed for anxiety.     amLODipine  (NORVASC ) 5 MG tablet Take 5 mg by mouth 2 (two) times daily.     aspirin  EC 81 MG tablet Take 1 tablet (81 mg total) by mouth daily. 30 tablet 11   cholecalciferol  (VITAMIN D ) 1000 units tablet Take 1,000 Units by mouth 2 (two) times daily.     dapagliflozin  propanediol (FARXIGA) 5 MG TABS tablet Take 5 mg by mouth daily.     denosumab  (PROLIA ) 60 MG/ML SOLN injection Inject 60 mg into the skin every 6 (six) months.      escitalopram  (LEXAPRO ) 10 MG tablet Take 10 mg by mouth daily.     estradiol  (ESTRACE ) 0.1 MG/GM vaginal cream Place 1 Applicatorful vaginally daily as needed (vaginal irritation).     famotidine  (PEPCID ) 40 MG tablet Take 40 mg by mouth daily.     furosemide  (LASIX ) 20 MG tablet Take 20 mg by mouth every Wednesday.     gabapentin  (NEURONTIN ) 300 MG capsule Take 300 mg by mouth at bedtime as needed (pain). (Patient taking differently: Take 600 mg by mouth at bedtime as needed (pain).)     levothyroxine  (SYNTHROID , LEVOTHROID) 100 MCG tablet Take 100 mcg by mouth daily before breakfast.   3   lidocaine -prilocaine  (EMLA ) cream Apply 1 application topically as needed (apply prior to port a cath access). 30 g 3   Magnesium  500 MG TABS Take 500 mg by mouth every morning.      Multiple Vitamin (MULTIVITAMIN WITH MINERALS) TABS tablet Take 1 tablet by mouth daily. Centrum Silver     NOVOLOG  FLEXPEN 100 UNIT/ML FlexPen Inject into the skin 2 (two) times daily. 14 Units AM, 18 Units PM     nystatin  cream (MYCOSTATIN ) Apply 1 application topically daily as needed (Yeast infection).     olmesartan (BENICAR) 20 MG tablet Take 20 mg by mouth daily.     pantoprazole  (PROTONIX ) 40 MG tablet Take 40 mg by mouth daily.     rivaroxaban  (XARELTO ) 20 MG TABS tablet Take 1 tablet (20 mg total) by mouth daily with supper. 90 tablet 3   rosuvastatin  (CRESTOR ) 20 MG tablet Take 20 mg by mouth every morning.  sodium bicarbonate  650 MG tablet Take 650 mg by mouth 2 (two) times daily.     traMADol  (ULTRAM ) 50 MG tablet Take 1 tablet (50 mg total) by mouth every 12 (twelve) hours as needed for severe pain (pain score 7-10). Hold your mirtazapine  if you take this medication. 10 tablet 0   TRESIBA FLEXTOUCH 200 UNIT/ML SOPN Inject 25-30 Units as directed at  bedtime. Titrate according to fasting blood glucose not to exceed 50 units a day  5   vitamin B-12 (CYANOCOBALAMIN ) 1000 MCG tablet Take 1,000 mcg by mouth daily.     vitamin C  (ASCORBIC ACID ) 250 MG tablet Take 250 mg by mouth daily.     vitamin E  400 UNIT capsule Take 400 Units by mouth daily.     zolpidem  (AMBIEN ) 10 MG tablet Take 10 mg by mouth at bedtime.     No current facility-administered medications for this visit.      PHYSICAL EXAMINATION:   Vitals:   12/18/23 1448  BP: 103/76  Pulse: 70  Resp: 16  Temp: 97.7 F (36.5 C)  SpO2: 97%    Filed Weights   12/18/23 1448  Weight: 175 lb 6.4 oz (79.6 kg)     Physical Exam Constitutional:      Comments: Alone.  Ambulating independently.  HENT:     Head: Normocephalic and atraumatic.     Mouth/Throat:     Pharynx: No oropharyngeal exudate.  Eyes:     Pupils: Pupils are equal, round, and reactive to light.  Cardiovascular:     Rate and Rhythm: Normal rate and regular rhythm.  Pulmonary:     Effort: Pulmonary effort is normal. No respiratory distress.     Breath sounds: Normal breath sounds. No wheezing.  Abdominal:     General: Bowel sounds are normal. There is no distension.     Palpations: Abdomen is soft. There is no mass.     Tenderness: There is no abdominal tenderness. There is no guarding or rebound.  Musculoskeletal:        General: No tenderness. Normal range of motion.     Cervical back: Normal range of motion and neck supple.  Skin:    General: Skin is warm.  Neurological:     Mental Status: Cheryl Leonard is alert and oriented to person, place, and time.  Psychiatric:        Mood and Affect: Affect normal.     LABORATORY DATA:  I have reviewed the data as listed Lab Results  Component Value Date   WBC 6.9 12/18/2023   HGB 10.6 (L) 12/18/2023   HCT 32.4 (L) 12/18/2023   MCV 92.8 12/18/2023   PLT 282 12/18/2023   Recent Labs    04/07/23 0652 04/08/23 0459 04/10/23 0451 04/18/23 1334  08/22/23 1233  NA 133*   < > 138 136 135  K 3.5   < > 4.1 4.5 4.6  CL 99   < > 111 103 107  CO2 22   < > 19* 24 20*  GLUCOSE 187*   < > 174* 198* 150*  BUN 38*   < > 25* 24* 35*  CREATININE 2.26*   < > 1.38* 1.44* 1.62*  CALCIUM  7.7*   < > 7.5* 9.6 8.0*  GFRNONAA 22*   < > 39* 37* 32*  PROT 6.5  --   --   --   --   ALBUMIN 3.2*  --   --   --   --   AST  17  --   --   --   --   ALT 16  --   --   --   --   ALKPHOS 46  --   --   --   --   BILITOT 1.1  --   --   --   --    < > = values in this interval not displayed.     Intravitreal Injection, Pharmacologic Agent - OD - Right Eye Result Date: 12/13/2023 Time Out 12/13/2023. 1:27 PM. Confirmed correct patient, procedure, site, and patient consented. Anesthesia Topical anesthesia was used. Anesthetic medications included Lidocaine  2%, Proparacaine 0.5%. Procedure Preparation included 5% betadine to ocular surface, eyelid speculum. A supplied (30 g) needle was used. Injection: 6 mg faricimab -svoa 6 MG/0.05ML   Route: Intravitreal, Site: Right Eye   NDC: 49757-903-93, Lot: [id number], Expiration date: 02/02/2025, Waste: 0 mL Post-op Post injection exam found visual acuity of at least counting fingers. The patient tolerated the procedure well. There were no complications. The patient received written and verbal post procedure care education. Post injection medications were not given.   OCT, Retina - OU - Both Eyes Result Date: 12/13/2023 Right Eye Quality was borderline. Central Foveal Thickness: 312. Progression has improved. Findings include no IRF, no SRF, abnormal foveal contour, epiretinal membrane, intraretinal fluid (interval improvement in IRF / cystic changes temporal fovea). Left Eye Quality was borderline. Central Foveal Thickness: 288. Progression has been stable. Findings include normal foveal contour, no IRF, no SRF. Notes *Images captured and stored on drive Diagnosis / Impression: OD: interval improvement in IRF / cystic changes  temporal fovea OS: NFP; no IRF/SRF--stable Clinical management: See below Abbreviations: NFP - Normal foveal profile. CME - cystoid macular edema. PED - pigment epithelial detachment. IRF - intraretinal fluid. SRF - subretinal fluid. EZ - ellipsoid zone. ERM - epiretinal membrane. ORA - outer retinal atrophy. ORT - outer retinal tubulation. SRHM - subretinal hyper-reflective material   MR THORACIC SPINE WO CONTRAST Result Date: 11/24/2023 EXAM: MR Thoracic Spine without 11/20/2023 05:12:50 PM TECHNIQUE: Multiplanar multisequence MRI of the thoracic spine was performed without the administration of intravenous contrast. COMPARISON: None available CLINICAL HISTORY: Chronic thoracic spine pain radiating outwards towards the right upper abdomen, history of costochondritis for 3-4 months. FINDINGS: BONES AND ALIGNMENT: Exaggerated thoracic kyphosis within the mid thoracic spine with additional straightening of thoracic kyphosis within the lower thoracic spine. Multiple chronic compression deformities. Chronic compression deformity of T4 with irregularity of the superior endplate and up to 30% height loss centrally. Chronic irregularity of the T6 superior endplate with minimal height loss. Irregularity of the T7 superior endplate with up to 40% height loss anteriorly. Hemangiomas at multiple levels, particularly at T5 and T6. Discogenic edema at T11-T12 and subtle discogenic edema at the L1-L2 level. Chronic irregularity of the T8 superior endplate and T9 inferior endplate likely reflecting Schmorl nodes. Chronic irregularity of the T11 inferior endplate posteriorly. No findings to suggest acute fracture. SPINAL CORD: Normal spinal cord volume. Normal spinal cord signal. The conus medullaris extends to the L1 level. SOFT TISSUES: No acute abnormality in the visualized paraspinal soft tissues. DEGENERATIVE CHANGES: Small disc bulges at multiple levels. T4-T5 and T6 indents the ventral thecal sac without contacting the  spinal cord. Disc bulge at T7-T8 indents the ventral thecal sac without contacting the spinal cord. Disc bulge at T11-T12 indents the ventral thecal sac without contacting the spinal cord. There is no severe spinal canal stenosis or cord compression or facet  arthrosis at multiple levels throughout the thoracic spine. Mild foraminal stenosis on the left at T8-T9. Additional degenerative endplate changes at T11-T12 with associated discogenic edema which may contribute to back pain. Subtle discogenic edema at the L1-L2 level. IMPRESSION: 1. Multiple chronic compression deformities with exaggerated mid thoracic kyphosis as detailed above. 2. Degenerative endplate changes at T11-12 with associated discogenic edema that may contribute to pain. 3. Small disc bulges at multiple levels. No severe spinal canal stenosis or cord compression. 4. Facet arthrosis at multiple thoracic levels. 5. Mild left foraminal stenosis at T8-T9. Electronically signed by: Donnice Mania MD 11/24/2023 03:45 PM EST RP Workstation: [id number]     Normocytic anemia # Anemia/likely secondary to CKD-III/iron  deficiency.  If Retacrit /iron  does not improve consider bone marrow biopsy. On gentle iron  [iron  biglycinate; 28 mg ]1 pill a day.    #  TODAY Hb 10.4-- HOLD retacrit ; Proceed with venofer .   # Etiology-CKD-III--IV; overall worsened [Dr.Kolluru];- stable.   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  #Poor IV access/Mediport placement- stable.   Schedule retacrit /venofer  on separate days  # DISPOSITION:  # Venofer  today; NO retacrit .   # in 2 months- port/ labs- H&H- possible retacrit .   # follow up 4 months  MD; port; labs- cbc/bmp; iron  studies;ferritin; possible retacrit  OR venofer - Dr.B     All questions were answered. The patient knows to call the clinic with any problems, questions or concerns.    Cindy JONELLE Joe, MD 12/18/2023 3:09 PM

## 2024-01-11 NOTE — Progress Notes (Signed)
 " [facility] - Clinic Note  01/17/2024     CHIEF COMPLAINT Patient presents for Retina Follow Up   HISTORY OF PRESENT ILLNESS: Cheryl Leonard is a 79 y.o. female who presents to the clinic today for:    HPI     Retina Follow Up   Patient presents with  CRVO/BRVO.  In right eye.  Severity is moderate.  Duration of 5 weeks.  Since onset it is stable.  I, the attending physician,  performed the HPI with the patient and updated documentation appropriately.        Comments   5 week Retina eval. Patient states no changes in vision      Last edited by Valdemar Rogue, MD on 01/17/2024 10:56 PM.    Patient states her vision is stable. Headed to [place]  next month  Referring physician: Cleotilde Oneil FALCON, MD [address] [facility] [address] [facility] [place],  [place] [address]  HISTORICAL INFORMATION:   Selected notes from the MEDICAL RECORD NUMBER Referred by Dr. Estelle for concern of DME OD Lab Results  Component Value Date   HGBA1C 7.3 (H) 04/07/2023     CURRENT MEDICATIONS: No current outpatient medications on file. (Ophthalmic Drugs)   No current facility-administered medications for this visit. (Ophthalmic Drugs)   Current Outpatient Medications (Other)  Medication Sig   ALPRAZolam  (XANAX ) 0.25 MG tablet Take 0.25 mg by mouth daily as needed for anxiety.   amLODipine  (NORVASC ) 5 MG tablet Take 5 mg by mouth 2 (two) times daily.   aspirin  EC 81 MG tablet Take 1 tablet (81 mg total) by mouth daily.   cholecalciferol  (VITAMIN D ) 1000 units tablet Take 1,000 Units by mouth 2 (two) times daily.   dapagliflozin propanediol (FARXIGA) 5 MG TABS tablet Take 5 mg by mouth daily.   denosumab  (PROLIA ) 60 MG/ML SOLN injection Inject 60 mg into the skin every 6 (six) months.    escitalopram  (LEXAPRO ) 10 MG tablet Take 10 mg by mouth daily.   estradiol  (ESTRACE ) 0.1 MG/GM vaginal cream Place 1 Applicatorful vaginally daily as needed (vaginal  irritation).   famotidine  (PEPCID ) 40 MG tablet Take 40 mg by mouth daily.   furosemide  (LASIX ) 20 MG tablet Take 20 mg by mouth every Wednesday.   gabapentin  (NEURONTIN ) 300 MG capsule Take 300 mg by mouth at bedtime as needed (pain).   levothyroxine  (SYNTHROID , LEVOTHROID) 100 MCG tablet Take 100 mcg by mouth daily before breakfast.    lidocaine -prilocaine  (EMLA ) cream Apply 1 application topically as needed (apply prior to port a cath access).   Magnesium  500 MG TABS Take 500 mg by mouth every morning.    Multiple Vitamin (MULTIVITAMIN WITH MINERALS) TABS tablet Take 1 tablet by mouth daily. Centrum Silver   NOVOLOG  FLEXPEN 100 UNIT/ML FlexPen Inject into the skin 2 (two) times daily. 14 Units AM, 18 Units PM   nystatin  cream (MYCOSTATIN ) Apply 1 application topically daily as needed (Yeast infection).   olmesartan (BENICAR) 20 MG tablet Take 20 mg by mouth daily.   pantoprazole  (PROTONIX ) 40 MG tablet Take 40 mg by mouth daily.   rivaroxaban  (XARELTO ) 20 MG TABS tablet Take 1 tablet (20 mg total) by mouth daily with supper.   rosuvastatin  (CRESTOR ) 20 MG tablet Take 20 mg by mouth every morning.   sodium bicarbonate  650 MG tablet Take 650 mg by mouth 2 (two) times daily.   traMADol  (ULTRAM ) 50 MG tablet Take 1 tablet (50 mg total) by mouth every 12 (  twelve) hours as needed for severe pain (pain score 7-10). Hold your mirtazapine  if you take this medication.   TRESIBA FLEXTOUCH 200 UNIT/ML SOPN Inject 25-30 Units as directed at bedtime. Titrate according to fasting blood glucose not to exceed 50 units a day   vitamin B-12 (CYANOCOBALAMIN ) 1000 MCG tablet Take 1,000 mcg by mouth daily.   vitamin C  (ASCORBIC ACID ) 250 MG tablet Take 250 mg by mouth daily.   vitamin E  400 UNIT capsule Take 400 Units by mouth daily.   zolpidem  (AMBIEN ) 10 MG tablet Take 10 mg by mouth at bedtime.   No current facility-administered medications for this visit. (Other)   REVIEW OF SYSTEMS: ROS   Positive for:  Gastrointestinal, Musculoskeletal, Endocrine, Cardiovascular, Eyes, Respiratory Negative for: Constitutional, Neurological, Skin, Genitourinary, HENT, Psychiatric, Allergic/Imm, Heme/Lymph Last edited by German Olam BRAVO, COT on 01/17/2024  1:25 PM.       ALLERGIES Allergies  Allergen Reactions   Ace Inhibitors Other (See Comments)    Unknown  Other Reaction(s): Intolerance   PAST MEDICAL HISTORY Past Medical History:  Diagnosis Date   Anemia    Anxiety    Arthritis    Gout   Cataracts, both eyes    Diabetic retinopathy (HCC)    NPDR OU   Diverticulitis    GERD (gastroesophageal reflux disease)    Gout    Headache    h/o migraines   History of fracture of patella    right knee   History of positive PPD    Patient always shows positive   Hyperlipidemia    Hypertension    Hypertensive retinopathy    OU   Hypothyroidism    Lichen sclerosus 12/30/2013   of vulva   Metatarsal fracture    Nausea and vomiting 04/11/2023   Neuropathy    Osteopenia    Peripheral vascular disease    Polyneuropathy    numbness and tingling in feet and toes   Renal insufficiency    Stage 3   Sleep apnea    does not use cpap-lost weight    Type 2 diabetes mellitus, uncontrolled    Past Surgical History:  Procedure Laterality Date   ABDOMINAL HYSTERECTOMY     AMPUTATION TOE Right 05/08/2020   Procedure: AMPUTATION TOE-Right 4th Toe;  Surgeon: Lennie Barter, DPM;  Location: [facility];  Service: Podiatry;  Laterality: Right;   AMPUTATION TOE Left 04/22/2021   Procedure: AMPUTATION TOE - 4TH METARSOPHANGEAL JOINT;  Surgeon: Lennie Barter, DPM;  Location: [facility];  Service: Podiatry;  Laterality: Left;   APPENDECTOMY     BREAST REDUCTION SURGERY  2001   CATARACT EXTRACTION     CESAREAN SECTION  1976   COLONOSCOPY  03/05/2013   Nml - due for repeat 03/06/2018   COLONOSCOPY WITH PROPOFOL  N/A 03/18/2019   Procedure: COLONOSCOPY WITH PROPOFOL ;  Surgeon: Toledo, Ladell POUR, MD;  Location: [facility]  ENDOSCOPY;  Service: Gastroenterology;  Laterality: N/A;   DIAGNOSTIC LAPAROSCOPY     DILATION AND CURETTAGE OF UTERUS  1989   ENDARTERECTOMY FEMORAL Bilateral 03/09/2018   Procedure: ENDARTERECTOMY FEMORAL;  Surgeon: Marea Selinda RAMAN, MD;  Location: [facility];  Service: Vascular;  Laterality: Bilateral;   ENDARTERECTOMY POPLITEAL Left 03/09/2018   Procedure: ENDARTERECTOMY POPLITEAL AND SFA;  Surgeon: Marea Selinda RAMAN, MD;  Location: [facility];  Service: Vascular;  Laterality: Left;   ESOPHAGOGASTRODUODENOSCOPY  03/05/2013   ESOPHAGOGASTRODUODENOSCOPY (EGD) WITH PROPOFOL  N/A 03/18/2019   Procedure: ESOPHAGOGASTRODUODENOSCOPY (EGD) WITH PROPOFOL ;  Surgeon: Aundria, Teodoro K, MD;  Location: [facility] END[facility];  Service: Gastroenterology;  Laterality: N/A;   EYE SURGERY     Eyelid Surgery  2012   INTRAMEDULLARY (IM) NAIL INTERTROCHANTERIC Left 10/30/2015   Procedure: INTRAMEDULLARY (IM) NAIL INTERTROCHANTRIC ;  Surgeon: Ozell Flake, MD;  Location: [facility];  Service: Orthopedics;  Laterality: Left;   KYPHOPLASTY N/A 10/25/2018   Procedure: L4 KYPHOPLASTY;  Surgeon: Flake Ozell, MD;  Location: [facility];  Service: Orthopedics;  Laterality: N/A;   LAPAROSCOPIC HYSTERECTOMY  2000   total   LOWER EXTREMITY ANGIOGRAPHY Left 03/08/2017   Procedure: LOWER EXTREMITY ANGIOGRAPHY;  Surgeon: Marea Selinda RAMAN, MD;  Location: [facility];  Service: Cardiovascular;  Laterality: Left;   LOWER EXTREMITY ANGIOGRAPHY Left 10/30/2017   Procedure: LOWER EXTREMITY ANGIOGRAPHY;  Surgeon: Marea Selinda RAMAN, MD;  Location: [facility];  Service: Cardiovascular;  Laterality: Left;   LOWER EXTREMITY ANGIOGRAPHY Right 03/08/2018   Procedure: LOWER EXTREMITY ANGIOGRAPHY;  Surgeon: Marea Selinda RAMAN, MD;  Location: [facility];  Service: Cardiovascular;  Laterality: Right;   LOWER EXTREMITY ANGIOGRAPHY Left 10/01/2018   Procedure: LOWER EXTREMITY ANGIOGRAPHY;  Surgeon: Marea Selinda RAMAN, MD;  Location: [facility];   Service: Cardiovascular;  Laterality: Left;   LOWER EXTREMITY ANGIOGRAPHY Right 10/08/2018   Procedure: LOWER EXTREMITY ANGIOGRAPHY;  Surgeon: Marea Selinda RAMAN, MD;  Location: [facility];  Service: Cardiovascular;  Laterality: Right;   LOWER EXTREMITY ANGIOGRAPHY Right 05/07/2020   Procedure: Lower Extremity Angiography;  Surgeon: Marea Selinda RAMAN, MD;  Location: [facility];  Service: Cardiovascular;  Laterality: Right;   LYSIS OF ADHESION  01/25/2021   Procedure: LYSIS OF ADHESION;  Surgeon: Lane Shope, MD;  Location: [facility];  Service: General;;   PERIPHERAL VASCULAR INTERVENTION  03/08/2018   Procedure: PERIPHERAL VASCULAR INTERVENTION;  Surgeon: Marea Selinda RAMAN, MD;  Location: [facility];  Service: Cardiovascular;;   PORTA CATH INSERTION N/A 02/17/2020   Procedure: PORTA CATH INSERTION;  Surgeon: Marea Selinda RAMAN, MD;  Location: [facility];  Service: Cardiovascular;  Laterality: N/A;   REDUCTION MAMMAPLASTY  1997   SACROPLASTY N/A 10/25/2018   Procedure: S1 SACROPLASTY;  Surgeon: Flake Ozell, MD;  Location: [facility];  Service: Orthopedics;  Laterality: N/A;   FAMILY HISTORY Family History  Problem Relation Age of Onset   Coronary artery disease Father    Heart attack Father    Coronary artery disease Mother    Heart attack Mother    Ovarian cancer Sister 27       sister had hormonal therapy for IVF txs-which increased risk factor for ovarian cancer   Breast cancer Neg Hx    SOCIAL HISTORY Social History   Tobacco Use   Smoking status: Former    Current packs/day: 0.00    Average packs/day: 1 pack/day for 20.0 years (20.0 ttl pk-yrs)    Types: Cigarettes    Start date: 03/07/1976    Quit date: 03/07/1996    Years since quitting: 27.8    Passive exposure: Past   Smokeless tobacco: Never   Tobacco comments:    started smoking at age 57 but stopped smoking in 2000  Vaping Use   Vaping status: Never Used  Substance Use Topics   Alcohol use: No     Alcohol/week: 0.0 standard drinks of alcohol   Drug use: No       OPHTHALMIC EXAM: Base Eye Exam     Visual Acuity (Snellen - Linear)  Right Left   Dist [place] 20/25 +1 20/20   Dist ph [place] 20/NI          Tonometry (Tonopen, 1:27 PM)       Right Left   Pressure 14 12         Pupils       Dark Light Shape React APD   Right 3 2 Round Brisk None   Left 3 2 Round Brisk None         Visual Fields (Counting fingers)       Left Right    Full Full         Extraocular Movement       Right Left    Full, Ortho Full, Ortho         Neuro/Psych     Oriented x3: Yes   Mood/Affect: Normal         Dilation     Both eyes: 1.0% Mydriacyl @ 1:27 PM           Slit Lamp and Fundus Exam     External Exam       Right Left   External Normal Normal         Slit Lamp Exam       Right Left   Lids/Lashes dermatochalasis dermatochalasis   Conjunctiva/Sclera White and quiet Trace Injection   Cornea arcus; well healed cataract wound; trace Punctate epithelial erosions arcus; well healed cataract wound, trace PEE   Anterior Chamber Deep and quiet Deep and quiet   Iris Round and dilated Round and dilated   Lens PCIOL; open PC PCIOL; open PC   Anterior Vitreous syneresis, Posterior vitreous detachment, vitreous condensations inferiorly syneresis, Posterior vitreous detachment         Fundus Exam       Right Left   Disc Superior hyperemia -- stably improved, mild Pallor, vascular loops Pink and Sharp   C/D Ratio 0.6 0.5   Macula Flat, Blunted foveal reflex, +cystic changes centrally--stably improved, +Epiretinal membrane, minimal MA flat; good foveal reflex, no heme or edema, small pigment clump IT to fovea   Vessels attenuated, Tortuous attenuated, Tortuous   Periphery Attached; scattered DBH -- greastest temporal periphery- improved Attached, no heme           IMAGING AND PROCEDURES  Imaging and Procedures for 04/25/17  OCT, Retina - OU -  Both Eyes       Right Eye Quality was good. Central Foveal Thickness: 316. Progression has been stable. Findings include no IRF, no SRF, abnormal foveal contour, epiretinal membrane, intraretinal fluid (Stable improvement in IRF / cystic changes temporal fovea).   Left Eye Quality was good. Central Foveal Thickness: 287. Progression has been stable. Findings include normal foveal contour, no IRF, no SRF.   Notes *Images captured and stored on drive  Diagnosis / Impression:  OD: Stable improvement in IRF / cystic changes temporal fovea OS: NFP; no IRF/SRF--stable  Clinical management:  See below  Abbreviations: NFP - Normal foveal profile. CME - cystoid macular edema. PED - pigment epithelial detachment. IRF - intraretinal fluid. SRF - subretinal fluid. EZ - ellipsoid zone. ERM - epiretinal membrane. ORA - outer retinal atrophy. ORT - outer retinal tubulation. SRHM - subretinal hyper-reflective material      Intravitreal Injection, Pharmacologic Agent - OD - Right Eye       Time Out 01/17/2024. 2:14 PM. Confirmed correct patient, procedure, site, and patient consented.   Anesthesia Topical anesthesia was used. Anesthetic  medications included Lidocaine  2%, Proparacaine 0.5%.   Procedure Preparation included 5% betadine to ocular surface, eyelid speculum. A supplied (30 g) needle was used.   Injection: 6 mg faricimab -svoa 6 MG/0.05ML   Route: Intravitreal, Site: Right Eye   NDC: 49757-903-93, Lot: [id number], Expiration date: 01/02/2025, Waste: 0 mL   Post-op Post injection exam found visual acuity of at least counting fingers. The patient tolerated the procedure well. There were no complications. The patient received written and verbal post procedure care education. Post injection medications were not given.              ASSESSMENT/PLAN:    ICD-10-CM   1. Branch retinal vein occlusion of right eye with macular edema (HCC)  H34.8310 OCT, Retina - OU - Both Eyes     Intravitreal Injection, Pharmacologic Agent - OD - Right Eye    faricimab -svoa (VABYSMO ) 6mg /0.23mL intravitreal injection    2. Both eyes affected by mild nonproliferative diabetic retinopathy with macular edema, associated with type 2 diabetes mellitus (HCC)  Z88.6786     3. Long term (current) use of oral hypoglycemic drugs  Z79.84     4. Current use of insulin  (HCC)  Z79.4     5. Essential hypertension  I10     6. Hypertensive retinopathy of both eyes  H35.033     7. Epiretinal membrane (ERM) of right eye  H35.371     8. Pseudophakia of both eyes  Z96.1       1. BRVO w/ CME OD - by history, pt states symptoms first noticed 2 wks prior to presentation, but reports changes may have occurred prior  - initial exam with differential tortuosity of vessels (OD > OS) - FA (02.10.20) shows mild late staining / leakage in macula, staining / leakage of disc -- improving CME - differential includes DM2 (DME), hypertensive retinopathy, inflammatory etiology / uveitis - S/P IVA OD #1 (02.08.19), #2 (03.11.19), #3 (04.09.19), #4 (05.20.19), #5 (02.10.20) - gave IVA OD on 2.10.20 due to pending Eylea4U for 2020 -- resulted in increased IRF/CME - review of OCT show persistent IRF and cystic changes  --  resistance to IVA  - June 2019 -- switched therapies:  ============================== - S/P IVE OD #1 (06.24.19), #2 (07.24.19), #3 (09.04.19), #4 (10.30.19),#5 (12.30.19), #6 (03.23.20), #7 (05.05.20), #8 (07.16.20), #9 (07.17.20), #10 (08.28.20), #11 (10.13.20), # 12 (11.17.20), #13 (2.8.21), #14 (03.09.21), #15 (04.13.21), #16 (05.11.21), #17 (06.17.21), #18 (07.23.21), #19 (08.30.21), #20 (10.04.21), #21 (11.08.21), #22 (12.08.21), #23 (01.31.22), #24 (02.28.22), #25 (04.01.22), #26 (06.15.22), #27 (07.13.22), #28 (08.17.22), #29 (09.21.22), #30 (10.19.22), #31 (11.16.22), #32 (12.16.22), #33 (01.13.23), #34 (02.10.23), #35 (03.15.23), #36 (04.14.23), #37 (05.15.23), #38 (06.12.23), #39  (07.10.23), #40 (08.07.23), #41 (09.06.23), #42 (10.04.23), #43 (11.01.23)  -- IVE resistance ================================ **interval increase in IRF at 5 weeks on 07.01.24 and at 6 wks on 02.28.25 (IVV)** - s/p IVV OD #1 (12.04.23), #2 (01.03.24), #3 (01.31.24), #4 (02.28.24), #5 (03.29.24), #6 (04.24.24), #7 (05.28.24), #8 (07.01.24), #9 (07.29.24), #10 (08.26.24), #11 (09.26.24), #12 (10.25.24), #13 (11.25.24), #14 (01.07.24), #15 (02.18.25), #16 (06.25.25), #17 (07.23.25), #18 (08.20.25), #19 (09.17.25), #20 (10.15.25), #21 (11.12.25), #22 (12.10.25) - s/p IVE HD OD #1 (03.25.25), #2 (04.29.25), #3 (05.27.25) - OCT today shows stable improvement in IRF / cystic changes temporal fovea at 5 wks  - BCVA OD improved to 20/25 from 20/30   - recommend IVV OD #23 today, (01.08.26) w/ f/u in 5 weeks  - RBA of procedure discussed, questions answered  - IVV  informed consent obtained and signed, 06.25.25 (OD)  - see procedure note  - f/u in 5 weeks  -- DFE/OCT/possible injection  2-4. Mild nonproliferative diabetic retinopathy, both eyes  - s/p IVE HD OD #1 (03.25.25), #2 (04.29.25), #3 (05.27.25) - A1c 5.5 (10.08.25), 5.6 (8.14.25), 7.3 (04.04.25)  - likely DME component contributing to CME OD  - OD Interval improvement in IRF / cystic changes temporal fovea   - OS with minimal diabetic retinopathy - Eylea  HD informed consent form obtained, signed and scanned on 03.25.25  - pt approved for Eylea  HD through Medicare and supplement  - monitor OS  - f/u 5 weeks, DFE, OCT  5,6. Hypertensive retinopathy OU - stable  - as above, may have contributing to CME OD  - discussed importance of tight BP control  - monitor  7. Epiretinal membrane, right eye   - stable nasal ERM  - no indication for surgery at this time  8. Pseudophakia OU  - s/p CE/IOL OU by cataract surgeon in [facility]  - doing well  - monitor  Ophthalmic Meds Ordered this visit:  Meds ordered this encounter  Medications    faricimab -svoa (VABYSMO ) 6mg /0.50mL intravitreal injection     Return in about 5 weeks (around 02/21/2024) for BRVO OD, DFE, OCT, likely IVV OD.  This document serves as a record of services personally performed by Redell JUDITHANN Hans, MD, PhD. It was created on their behalf by Almetta Pesa, an ophthalmic technician. The creation of this record is the provider's dictation and/or activities during the visit.    Electronically signed by: Almetta Pesa, OA, 01/17/24  10:57 PM  Redell JUDITHANN Hans, M.D., Ph.D. Diseases & Surgery of the Retina and Vitreous [facility]  I have reviewed the above documentation for accuracy and completeness, and I agree with the above. Redell JUDITHANN Hans, M.D., Ph.D. 01/17/24 10:58 PM   Abbreviations: M myopia (nearsighted); A astigmatism; H hyperopia (farsighted); P presbyopia; Mrx spectacle prescription;  CTL contact lenses; OD right eye; OS left eye; OU both eyes  XT exotropia; ET esotropia; PEK punctate epithelial keratitis; PEE punctate epithelial erosions; DES dry eye syndrome; MGD meibomian gland dysfunction; ATs artificial tears; PFAT's preservative free artificial tears; NSC nuclear sclerotic cataract; PSC posterior subcapsular cataract; ERM epi-retinal membrane; PVD posterior vitreous detachment; RD retinal detachment; DM diabetes mellitus; DR diabetic retinopathy; NPDR non-proliferative diabetic retinopathy; PDR proliferative diabetic retinopathy; CSME clinically significant macular edema; DME diabetic macular edema; dbh dot blot hemorrhages; CWS cotton wool spot; POAG primary open angle glaucoma; C/D cup-to-disc ratio; HVF humphrey visual field; GVF goldmann visual field; OCT optical coherence tomography; IOP intraocular pressure; BRVO Branch retinal vein occlusion; CRVO central retinal vein occlusion; CRAO central retinal artery occlusion; BRAO branch retinal artery occlusion; RT retinal tear; SB scleral buckle; PPV pars plana vitrectomy; VH  Vitreous hemorrhage; PRP panretinal laser photocoagulation; IVK intravitreal kenalog; VMT vitreomacular traction; MH Macular hole;  NVD neovascularization of the disc; NVE neovascularization elsewhere; AREDS age related eye disease study; ARMD age related macular degeneration; POAG primary open angle glaucoma; EBMD epithelial/anterior basement membrane dystrophy; ACIOL anterior chamber intraocular lens; IOL intraocular lens; PCIOL posterior chamber intraocular lens; Phaco/IOL phacoemulsification with intraocular lens placement; PRK photorefractive keratectomy; LASIK laser assisted in situ keratomileusis; HTN hypertension; DM diabetes mellitus; COPD chronic obstructive pulmonary disease "

## 2024-01-17 ENCOUNTER — Encounter (INDEPENDENT_AMBULATORY_CARE_PROVIDER_SITE_OTHER): Payer: Self-pay | Admitting: Ophthalmology

## 2024-01-17 ENCOUNTER — Ambulatory Visit (INDEPENDENT_AMBULATORY_CARE_PROVIDER_SITE_OTHER): Admitting: Ophthalmology

## 2024-01-17 DIAGNOSIS — Z794 Long term (current) use of insulin: Secondary | ICD-10-CM | POA: Diagnosis not present

## 2024-01-17 DIAGNOSIS — Z7984 Long term (current) use of oral hypoglycemic drugs: Secondary | ICD-10-CM

## 2024-01-17 DIAGNOSIS — I10 Essential (primary) hypertension: Secondary | ICD-10-CM | POA: Diagnosis not present

## 2024-01-17 DIAGNOSIS — E113213 Type 2 diabetes mellitus with mild nonproliferative diabetic retinopathy with macular edema, bilateral: Secondary | ICD-10-CM

## 2024-01-17 DIAGNOSIS — H35371 Puckering of macula, right eye: Secondary | ICD-10-CM

## 2024-01-17 DIAGNOSIS — Z961 Presence of intraocular lens: Secondary | ICD-10-CM

## 2024-01-17 DIAGNOSIS — H34831 Tributary (branch) retinal vein occlusion, right eye, with macular edema: Secondary | ICD-10-CM

## 2024-01-17 DIAGNOSIS — H35033 Hypertensive retinopathy, bilateral: Secondary | ICD-10-CM

## 2024-01-17 MED ORDER — FARICIMAB-SVOA 6 MG/0.05ML IZ SOSY
6.0000 mg | PREFILLED_SYRINGE | INTRAVITREAL | Status: AC | PRN
  Administered 2024-01-17: 6 mg via INTRAVITREAL

## 2024-02-20 ENCOUNTER — Inpatient Hospital Stay

## 2024-02-21 ENCOUNTER — Encounter (INDEPENDENT_AMBULATORY_CARE_PROVIDER_SITE_OTHER): Admitting: Ophthalmology

## 2024-03-19 ENCOUNTER — Encounter (INDEPENDENT_AMBULATORY_CARE_PROVIDER_SITE_OTHER)

## 2024-03-19 ENCOUNTER — Ambulatory Visit (INDEPENDENT_AMBULATORY_CARE_PROVIDER_SITE_OTHER): Admitting: Vascular Surgery

## 2024-03-27 ENCOUNTER — Encounter (INDEPENDENT_AMBULATORY_CARE_PROVIDER_SITE_OTHER): Payer: Medicare Other | Admitting: Ophthalmology

## 2024-04-16 ENCOUNTER — Inpatient Hospital Stay: Admitting: Internal Medicine

## 2024-04-16 ENCOUNTER — Inpatient Hospital Stay
